# Patient Record
Sex: Male | Born: 1975
Health system: Southern US, Community
[De-identification: ages and names within clinical notes are randomized; demographics above are authoritative.]

## PROBLEM LIST (undated history)

## (undated) ENCOUNTER — Emergency Department: Admission: EM | Discharge status: Absent without leave | Payer: Self-pay

## (undated) DIAGNOSIS — E119 Type 2 diabetes mellitus without complications: Secondary | ICD-10-CM

## (undated) DIAGNOSIS — K3184 Gastroparesis: Secondary | ICD-10-CM

## (undated) DIAGNOSIS — U071 COVID-19: Secondary | ICD-10-CM

## (undated) DIAGNOSIS — A159 Respiratory tuberculosis unspecified: Secondary | ICD-10-CM

## (undated) HISTORY — PX: NO PAST SURGERIES: SHX2092

---

## 2015-01-15 ENCOUNTER — Emergency Department (INDEPENDENT_AMBULATORY_CARE_PROVIDER_SITE_OTHER): Payer: Worker's Compensation

## 2015-01-15 ENCOUNTER — Encounter (HOSPITAL_COMMUNITY): Payer: Self-pay | Admitting: Emergency Medicine

## 2015-01-15 ENCOUNTER — Emergency Department (INDEPENDENT_AMBULATORY_CARE_PROVIDER_SITE_OTHER)
Admission: EM | Admit: 2015-01-15 | Discharge: 2015-01-15 | Disposition: A | Discharge status: Home / Self Care | Payer: Worker's Compensation | Source: Home / Self Care | Attending: Emergency Medicine | Admitting: Emergency Medicine

## 2015-01-15 DIAGNOSIS — S99922A Unspecified injury of left foot, initial encounter: Secondary | ICD-10-CM

## 2015-01-15 HISTORY — DX: Type 2 diabetes mellitus without complications: E11.9

## 2015-01-15 IMAGING — DX DG FOOT COMPLETE 3+V*L*
3 series · 3 of 3 positions shown · non-contrast
Comparison: None.

CLINICAL DATA: Pt had a piece of metal fall on his foot today at
work, pain, swelling of first 3 toes, great toe, 2nd, 3rd, bruising

EXAM:
LEFT FOOT - COMPLETE 3+ VIEW

[foot ap]
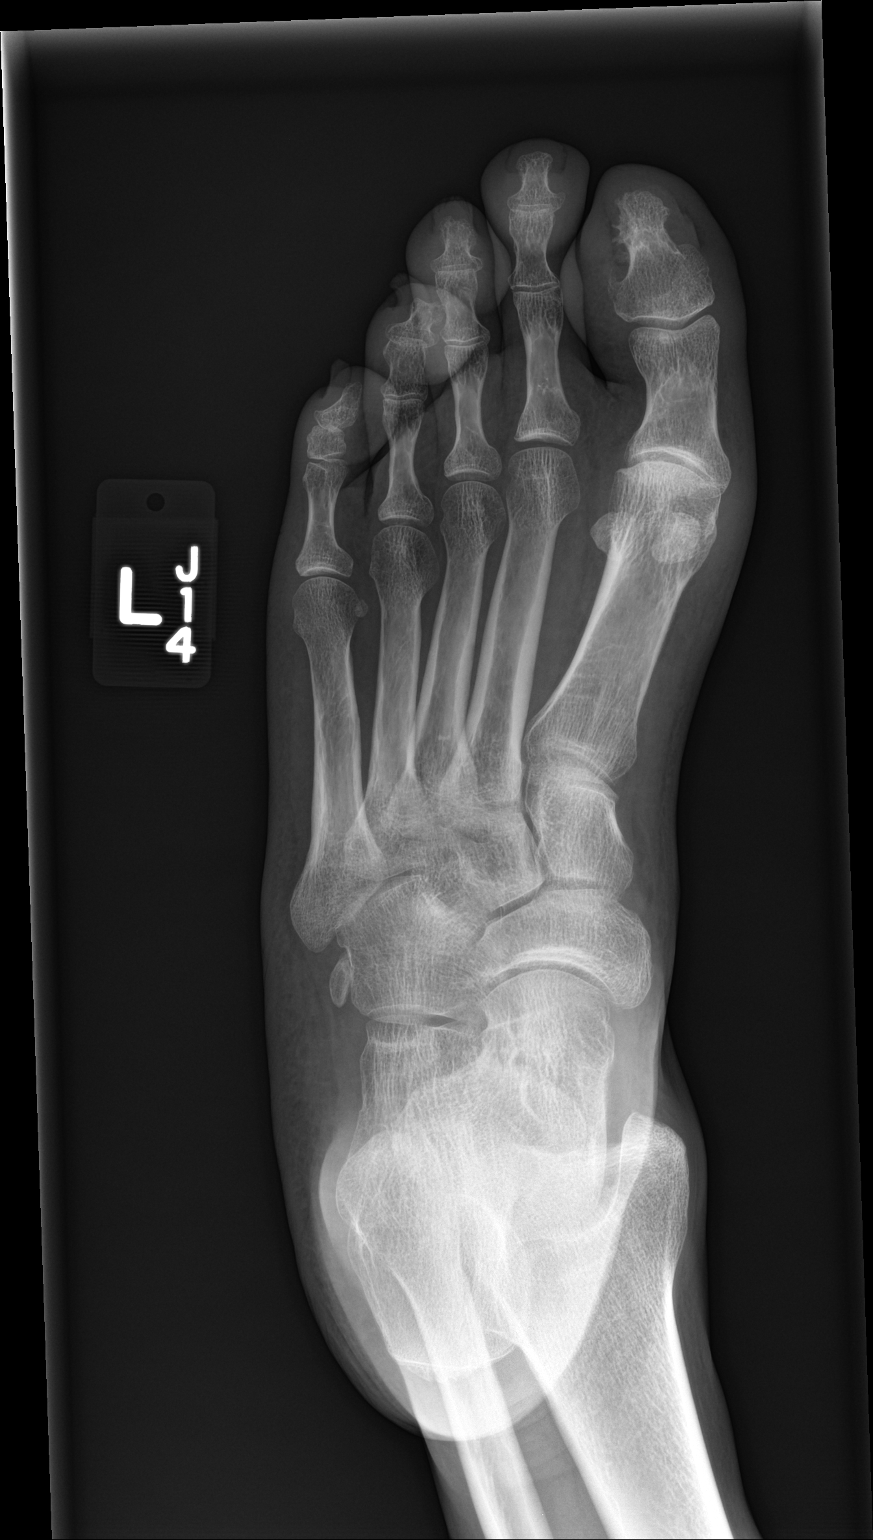

[foot obl]
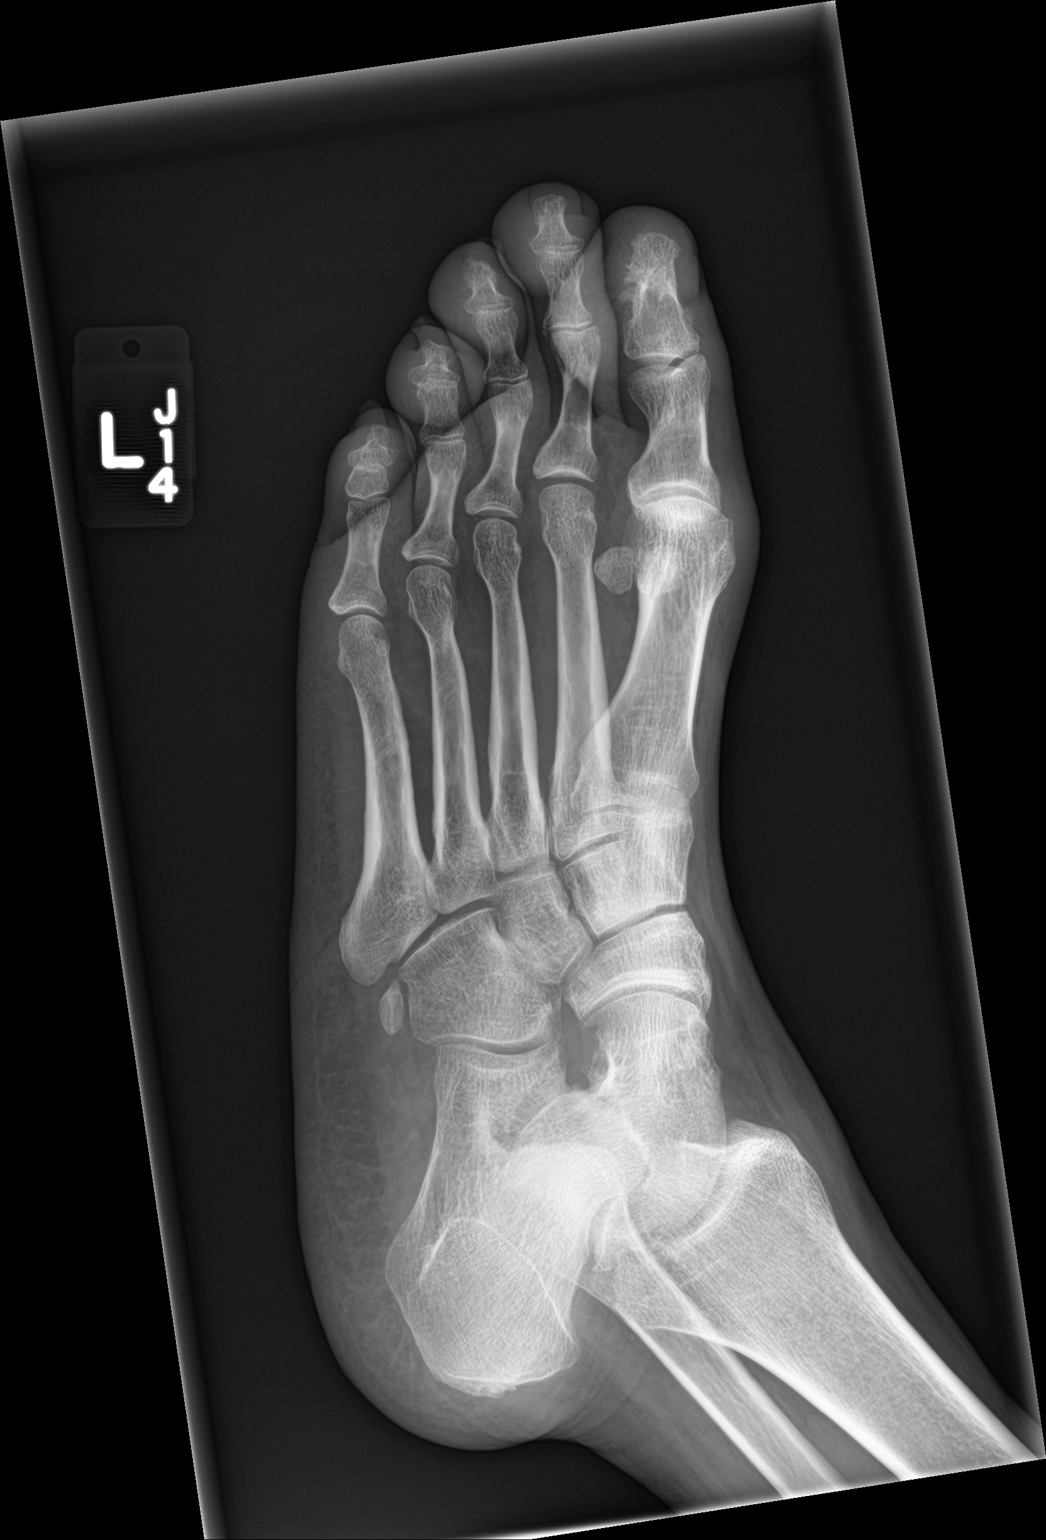

[foot lat]
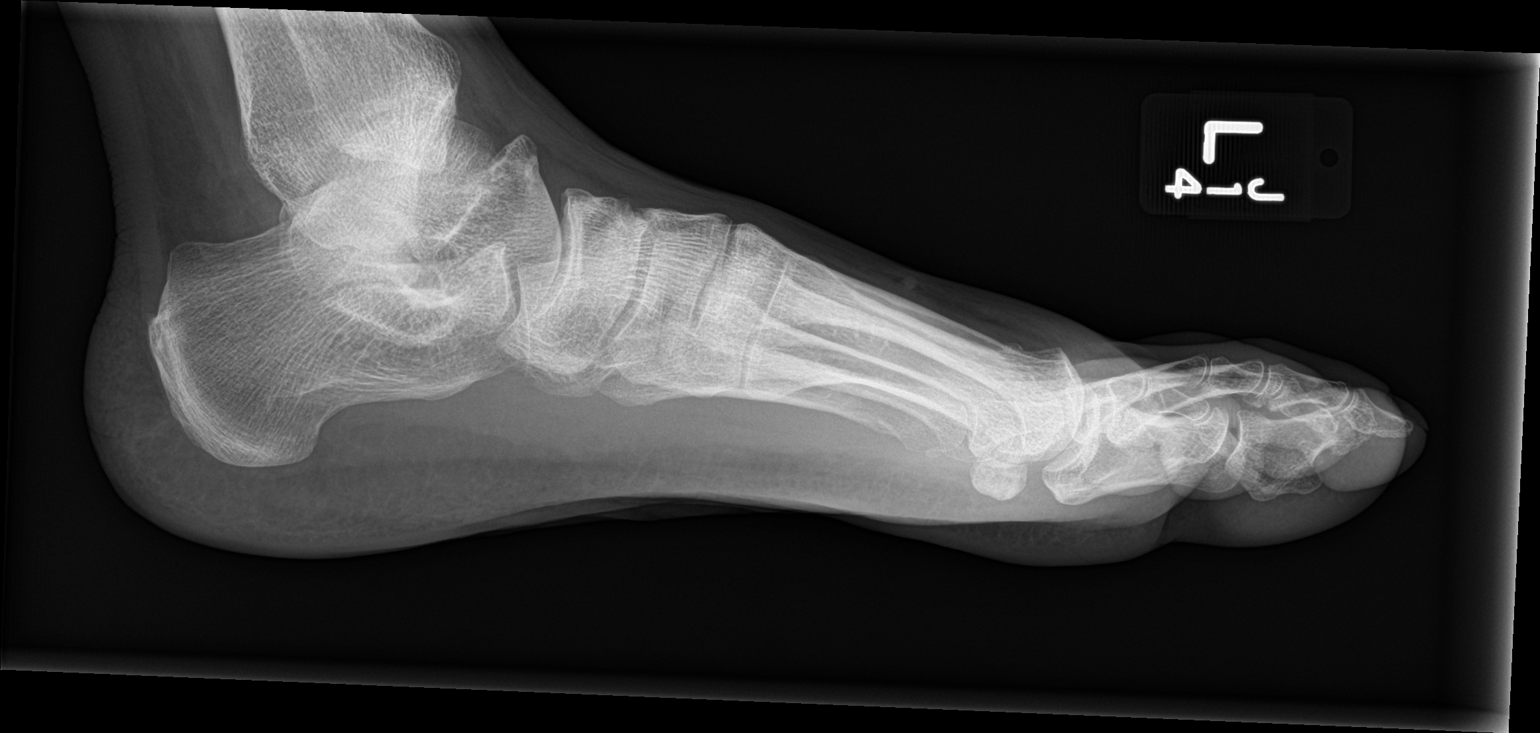

[3 of 3 positions shown; findings below may reference images not displayed]

FINDINGS: No fracture or dislocation of mid foot or forefoot. The phalanges
are normal. The calcaneus is normal. No soft tissue abnormality.
IMPRESSION: No fracture or dislocation.

## 2015-01-15 MED ORDER — IBUPROFEN 600 MG PO TABS: 600.0000 mg | ORAL_TABLET | Freq: Four times a day (QID) | ORAL | Status: DC | PRN

## 2015-01-15 MED ORDER — HYDROCODONE-ACETAMINOPHEN 5-325 MG PO TABS
1.0000 | ORAL_TABLET | Freq: Once | ORAL | Status: AC
  Administered 2015-01-15: 1 via ORAL

## 2015-01-15 MED ORDER — HYDROCODONE-ACETAMINOPHEN 5-325 MG PO TABS: 1.0000 | ORAL_TABLET | ORAL | Status: DC | PRN

## 2015-01-15 MED ORDER — HYDROCODONE-ACETAMINOPHEN 5-325 MG PO TABS
ORAL_TABLET | ORAL | Status: AC
Start: 2015-01-15 — End: 2015-01-15
  Filled 2015-01-15: qty 1

## 2015-01-15 NOTE — Discharge Instructions (Signed)
Wear the postop shoe until you see the doctor tomorrow. Apply ice to the area. With ice pack on for 15 minutes, and 15 minutes off, then 15 minutes on, continue with this pattern. Take ibuprofen every 6 hours as needed for pain. Use the hydrocodone every 4-6 hours as needed for severe pain.  Follow-up with occupational medicine doctor tomorrow as scheduled.

## 2015-01-15 NOTE — ED Notes (Signed)
Pt states that a 100lb metal plate fell on his left foot today at work.

## 2015-01-15 NOTE — ED Provider Notes (Signed)
CSN: [id number]     Arrival date & time 01/15/15  1716 History   First MD Initiated Contact with Patient 01/15/15 1800     Chief Complaint  Patient presents with  . Foot Injury   (Consider location/radiation/quality/duration/timing/severity/associated sxs/prior Treatment) HPI  He is a 39 year old man here for evaluation of left foot injury. He was at work today when he dropped a heavy metal object on his left foot. He has pain and swelling across his first second and third metatarsals and toes. He is able to move all of his toes.  Past Medical History  Diagnosis Date  . Diabetes mellitus without complication    History reviewed. No pertinent past surgical history. History reviewed. No pertinent family history. History  Substance Use Topics  . Smoking status: Never Smoker   . Smokeless tobacco: Not on file  . Alcohol Use: No    Review of Systems As in history of present illness Allergies  Review of patient's allergies indicates no known allergies.  Home Medications   Prior to Admission medications   Medication Sig Start Date End Date Taking? Authorizing Provider  HYDROcodone-acetaminophen (NORCO/VICODIN) 5-325 MG per tablet Take 1 tablet by mouth every 4 (four) hours as needed for moderate pain. 01/15/15   Melony Overly, MD  ibuprofen (ADVIL,MOTRIN) 600 MG tablet Take 1 tablet (600 mg total) by mouth every 6 (six) hours as needed for mild pain or moderate pain. 01/15/15   Melony Overly, MD   BP 117/74 mmHg  Pulse 82  Temp(Src) 99.1 F (37.3 C) (Oral)  Resp 18  SpO2 99% Physical Exam  Constitutional: He is oriented to person, place, and time. He appears well-developed and well-nourished. No distress.  Cardiovascular: Normal rate.   Pulmonary/Chest: Effort normal.  Musculoskeletal:  Left foot: Bruising and swelling over the first MTP joint and great toe, and the second and third toes. He has brisk cap refill in all digits. He is quite tender, particularly at the first MTP joint.   Neurological: He is alert and oriented to person, place, and time.    ED Course  Procedures (including critical care time) Labs Review Labs Reviewed - No data to display  Imaging Review Dg Foot Complete Left  01/15/2015   CLINICAL DATA:  Pt had a piece of metal fall on his foot today at work, pain, swelling of first 3 toes, great toe, 2nd, 3rd, bruising  EXAM: LEFT FOOT - COMPLETE 3+ VIEW  COMPARISON:  None.  FINDINGS: No fracture or dislocation of mid foot or forefoot. The phalanges are normal. The calcaneus is normal. No soft tissue abnormality.  IMPRESSION: No fracture or dislocation.   Electronically Signed   By: Suzy Bouchard M.D.   On: 01/15/2015 18:23     MDM   1. Injury of left foot, initial encounter    Norco 5-325 mg one tablet given for pain.  Postop shoe for comfort. Discussed icing. Ibuprofen and Norco for pain. Follow-up at occupational medicine as scheduled tomorrow morning at [facility] R[facility]i [place], MD 01/15/15 [phone]

## 2015-02-09 IMAGING — CR DG WRIST COMPLETE 3+V*R*
4 series · 4 of 4 positions shown · non-contrast
Comparison: None.

CLINICAL DATA: Fall.  Diffuse right wrist pain.

EXAM:
RIGHT WRIST - COMPLETE 3+ VIEW

[wrist pa]
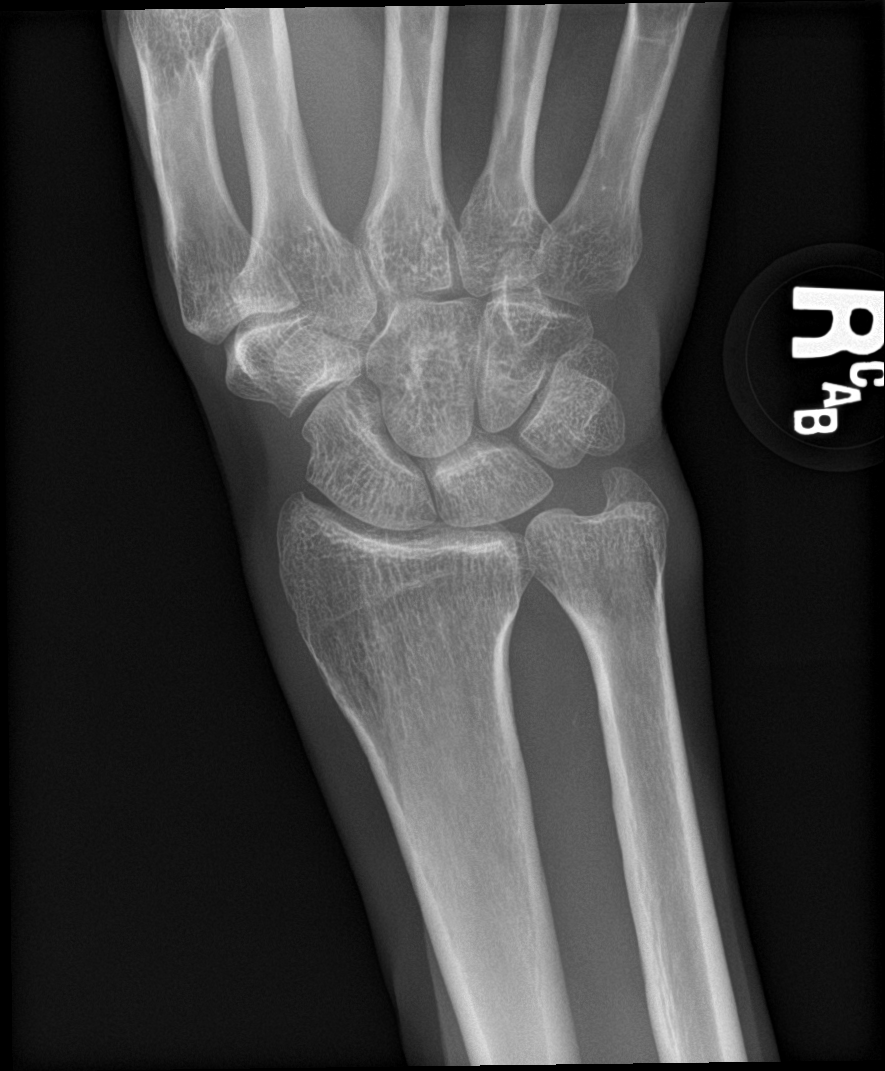

[wrist obl]
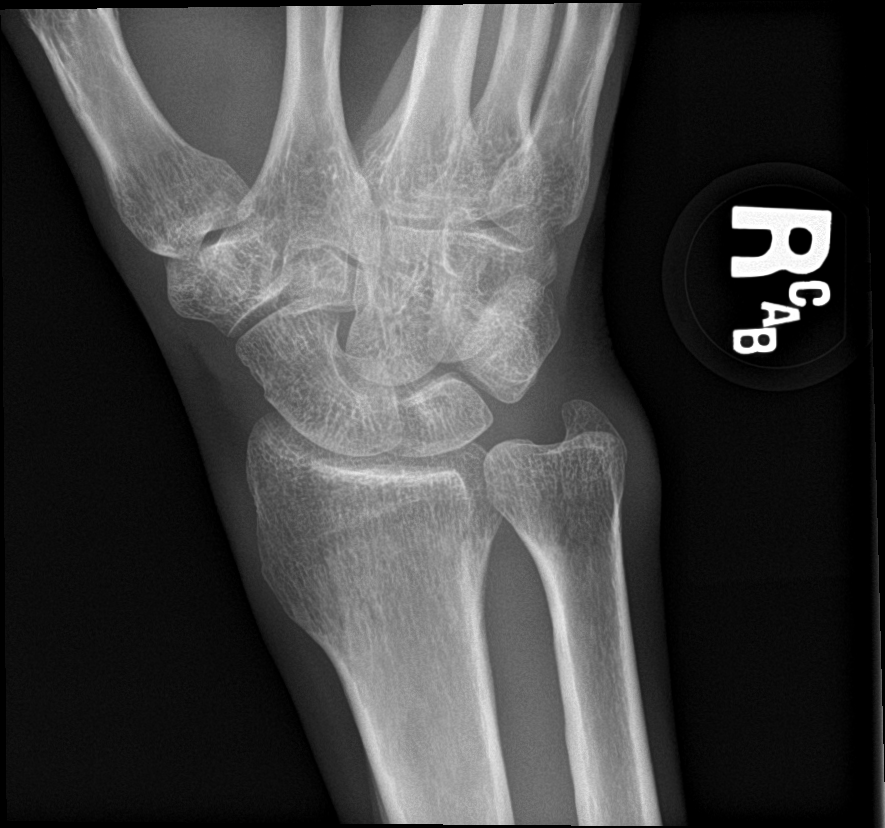

[wrist lat]
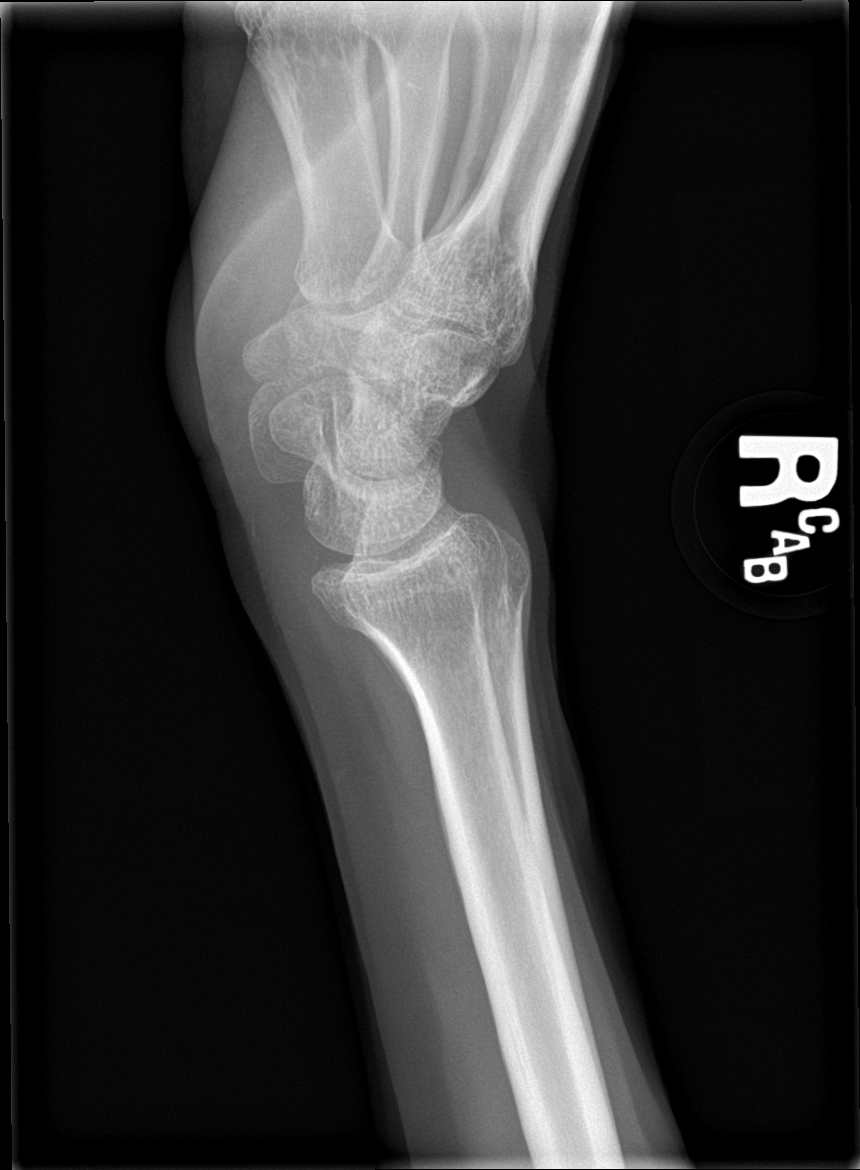

[navicular]
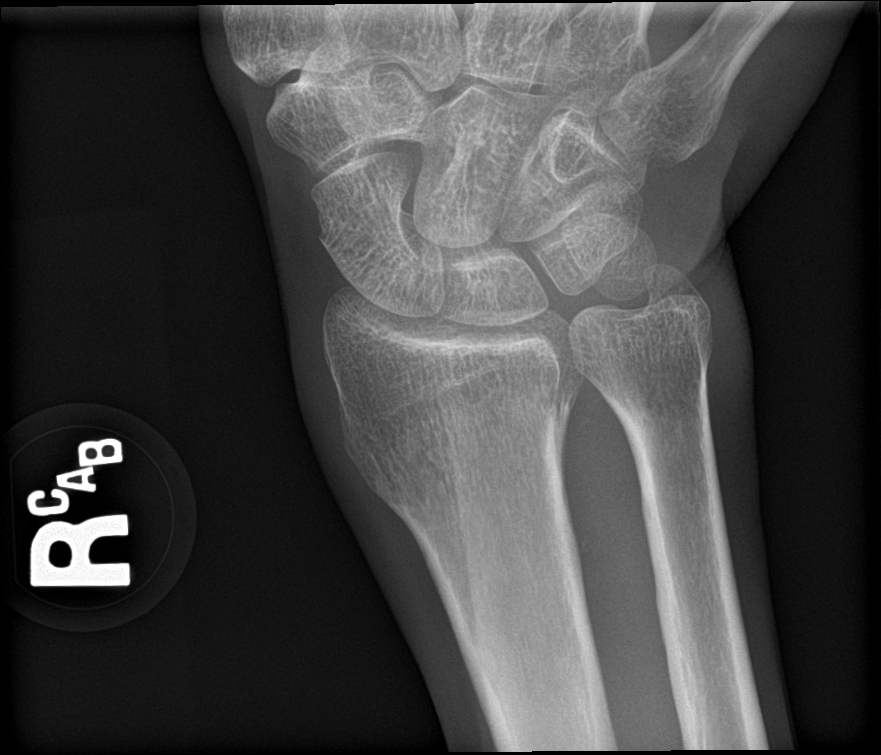

[4 of 4 positions shown; findings below may reference images not displayed]

FINDINGS: There is no evidence of fracture or dislocation. There is no
evidence of arthropathy or other focal bone abnormality. Soft
tissues are unremarkable.
IMPRESSION: Negative.

## 2015-08-04 ENCOUNTER — Other Ambulatory Visit: Payer: Self-pay | Admitting: Family Medicine

## 2015-08-04 ENCOUNTER — Ambulatory Visit
Admission: RE | Admit: 2015-08-04 | Discharge: 2015-08-04 | Disposition: A | Discharge status: Home / Self Care | Payer: No Typology Code available for payment source | Source: Ambulatory Visit | Attending: Family Medicine | Admitting: Family Medicine

## 2015-08-04 DIAGNOSIS — R52 Pain, unspecified: Secondary | ICD-10-CM

## 2015-08-04 IMAGING — CR DG RIBS W/ CHEST 3+V*L*
4 series · 4 of 4 positions shown · non-contrast
Comparison: None.

CLINICAL DATA: Left anterior chest pain following an injury.

EXAM:
LEFT RIBS AND CHEST - 3+ VIEW

[w chest pa]
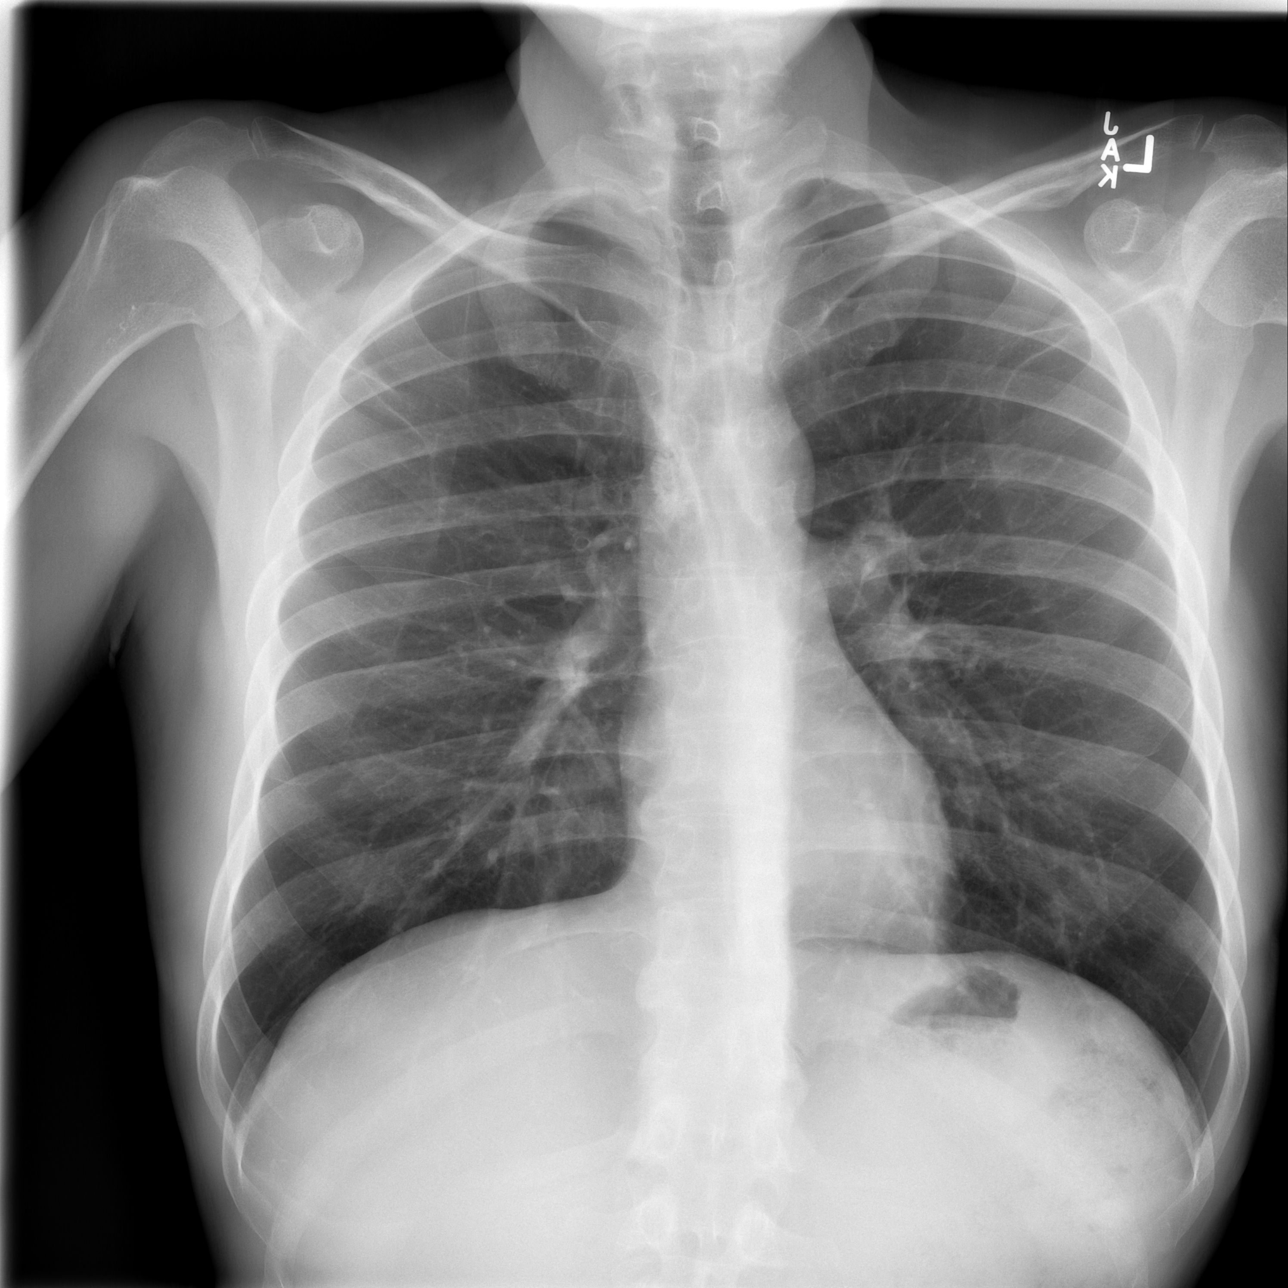

[w ribs ap/pa upper left *]
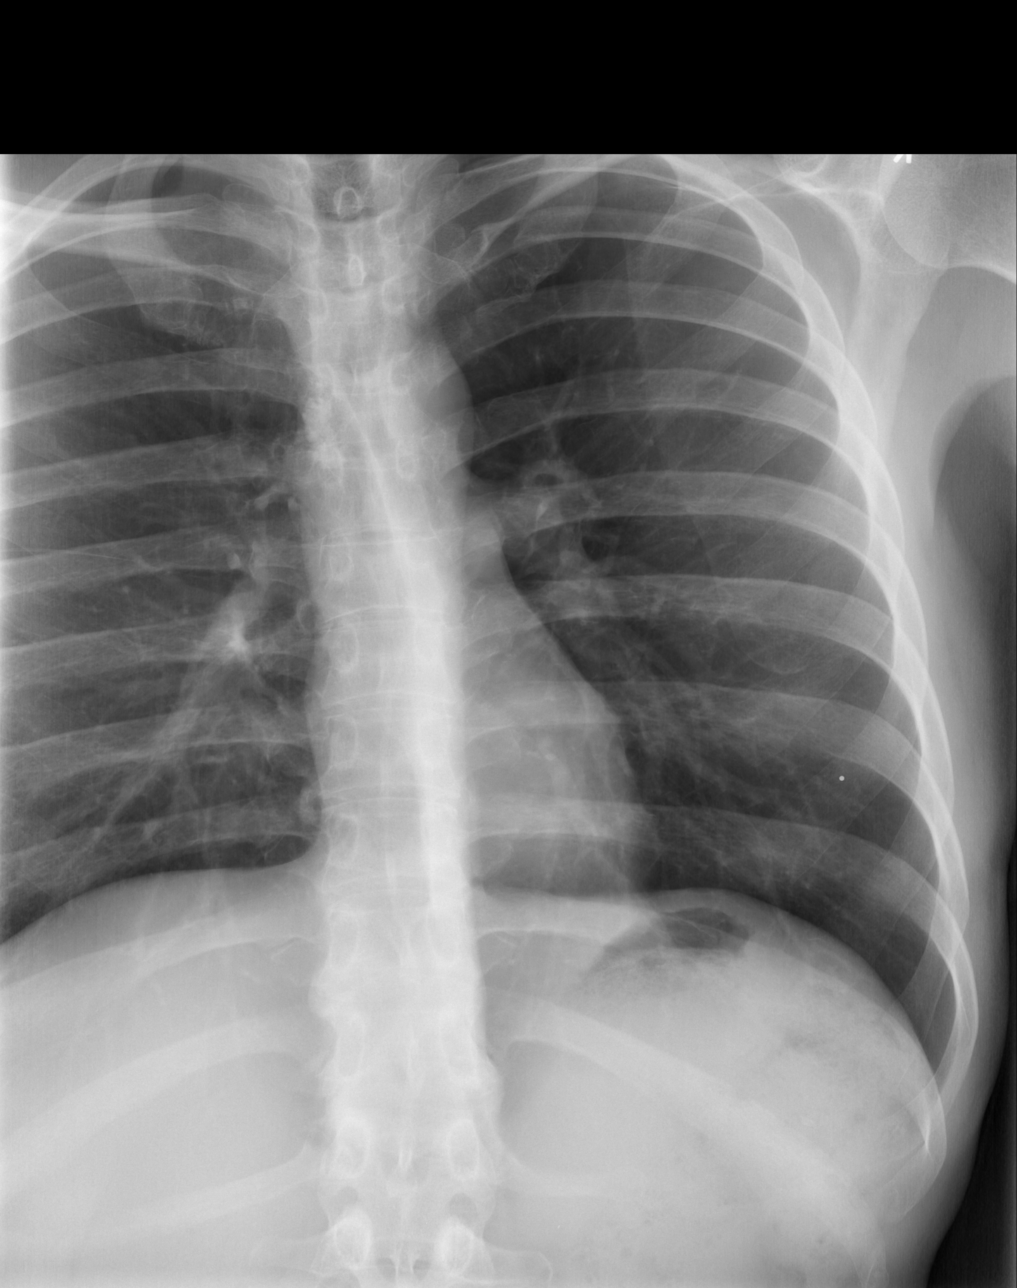

[w ribs ap/pa lower left *]
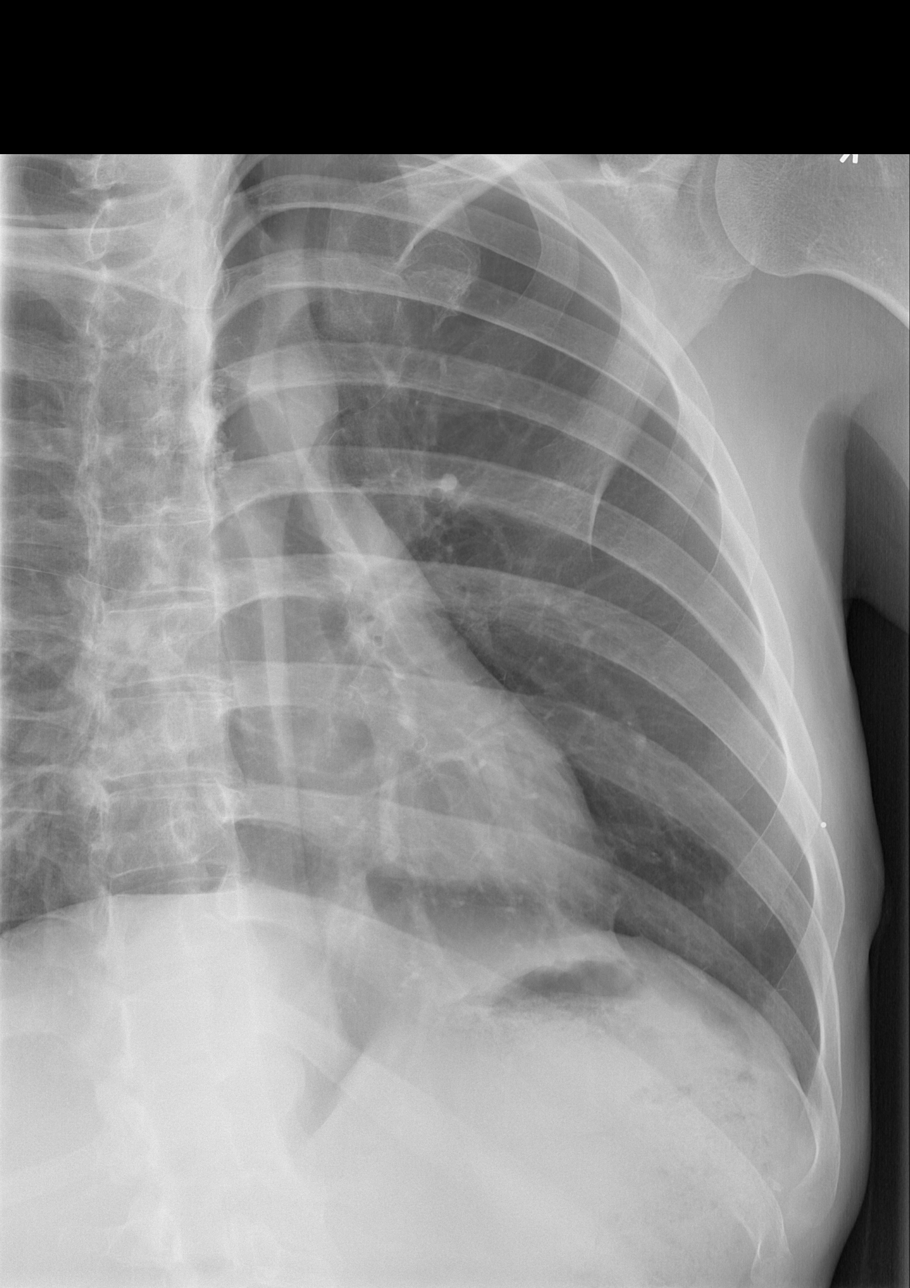

[w ribs oblique left *]
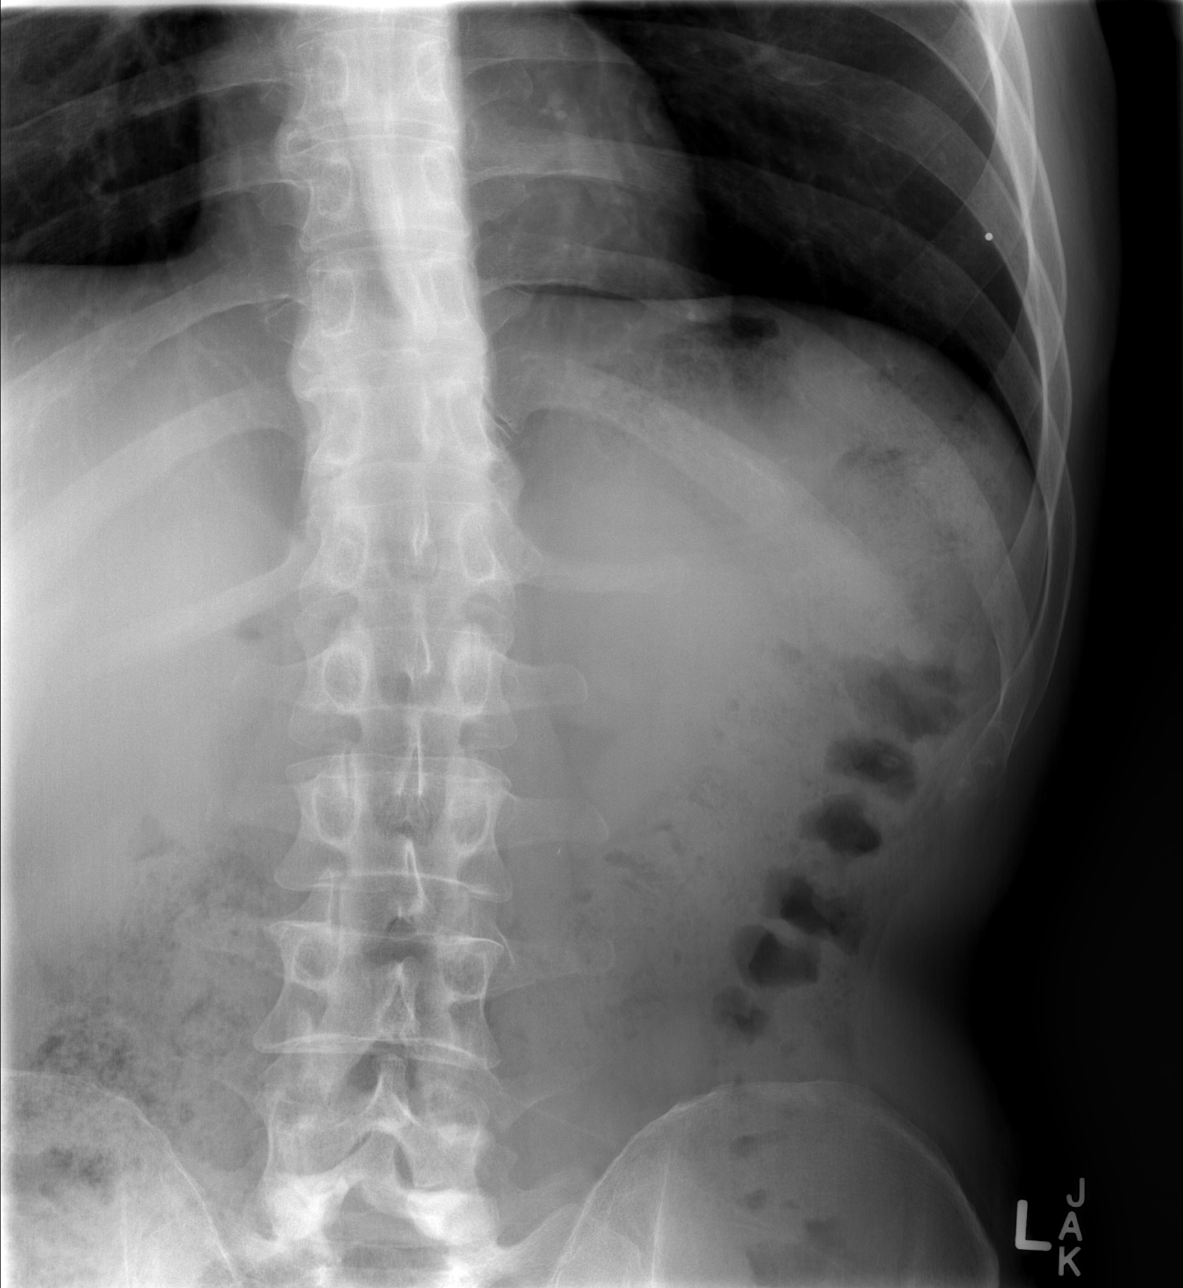

[4 of 4 positions shown; findings below may reference images not displayed]

FINDINGS: Normal sized heart. Clear lungs. Minimal central peribronchial
thickening. No rib fracture or pneumothorax. Calcified right
superior mediastinal lymph nodes.
IMPRESSION: No fracture or acute abnormality. Minimal chronic bronchitic
changes.

## 2016-04-06 ENCOUNTER — Emergency Department: Payer: Self-pay

## 2016-04-06 ENCOUNTER — Encounter: Payer: Self-pay | Admitting: Medical Oncology

## 2016-04-06 ENCOUNTER — Inpatient Hospital Stay
Admission: EM | Admit: 2016-04-06 | Discharge: 2016-04-10 | DRG: 193 | Disposition: A | Discharge status: Home / Self Care | Payer: Self-pay | Attending: Internal Medicine | Admitting: Internal Medicine

## 2016-04-06 DIAGNOSIS — E871 Hypo-osmolality and hyponatremia: Secondary | ICD-10-CM | POA: Diagnosis present

## 2016-04-06 DIAGNOSIS — J984 Other disorders of lung: Secondary | ICD-10-CM

## 2016-04-06 DIAGNOSIS — W010XXA Fall on same level from slipping, tripping and stumbling without subsequent striking against object, initial encounter: Secondary | ICD-10-CM | POA: Diagnosis present

## 2016-04-06 DIAGNOSIS — Z681 Body mass index (BMI) 19 or less, adult: Secondary | ICD-10-CM

## 2016-04-06 DIAGNOSIS — E46 Unspecified protein-calorie malnutrition: Secondary | ICD-10-CM | POA: Insufficient documentation

## 2016-04-06 DIAGNOSIS — Y990 Civilian activity done for income or pay: Secondary | ICD-10-CM

## 2016-04-06 DIAGNOSIS — R739 Hyperglycemia, unspecified: Secondary | ICD-10-CM

## 2016-04-06 DIAGNOSIS — R918 Other nonspecific abnormal finding of lung field: Secondary | ICD-10-CM

## 2016-04-06 DIAGNOSIS — Z833 Family history of diabetes mellitus: Secondary | ICD-10-CM

## 2016-04-06 DIAGNOSIS — E876 Hypokalemia: Secondary | ICD-10-CM | POA: Diagnosis present

## 2016-04-06 DIAGNOSIS — R9389 Abnormal findings on diagnostic imaging of other specified body structures: Secondary | ICD-10-CM

## 2016-04-06 DIAGNOSIS — M25531 Pain in right wrist: Secondary | ICD-10-CM | POA: Diagnosis present

## 2016-04-06 DIAGNOSIS — J189 Pneumonia, unspecified organism: Principal | ICD-10-CM | POA: Diagnosis present

## 2016-04-06 DIAGNOSIS — E43 Unspecified severe protein-calorie malnutrition: Secondary | ICD-10-CM | POA: Diagnosis present

## 2016-04-06 DIAGNOSIS — E1165 Type 2 diabetes mellitus with hyperglycemia: Secondary | ICD-10-CM | POA: Diagnosis present

## 2016-04-06 LAB — RAPID HIV SCREEN (HIV 1/2 AB+AG)
HIV 1/2 Antibodies: NONREACTIVE
HIV-1 P24 Antigen - HIV24: NONREACTIVE

## 2016-04-06 LAB — URINALYSIS COMPLETE WITH MICROSCOPIC (ARMC ONLY)
BACTERIA UA: NONE SEEN
BILIRUBIN URINE: NEGATIVE
HGB URINE DIPSTICK: NEGATIVE
LEUKOCYTES UA: NEGATIVE
Nitrite: NEGATIVE
Protein, ur: NEGATIVE mg/dL
Specific Gravity, Urine: 1.028 (ref 1.005–1.030)
Squamous Epithelial / LPF: NONE SEEN
WBC UA: NONE SEEN WBC/hpf (ref 0–5)
pH: 5 (ref 5.0–8.0)

## 2016-04-06 LAB — GLUCOSE, CAPILLARY
GLUCOSE-CAPILLARY: 344 mg/dL — AB (ref 65–99)
GLUCOSE-CAPILLARY: 381 mg/dL — AB (ref 65–99)
Glucose-Capillary: 407 mg/dL — ABNORMAL HIGH (ref 65–99)

## 2016-04-06 LAB — BASIC METABOLIC PANEL
ANION GAP: 16 — AB (ref 5–15)
BUN: 20 mg/dL (ref 6–20)
CO2: 21 mmol/L — ABNORMAL LOW (ref 22–32)
Calcium: 9.1 mg/dL (ref 8.9–10.3)
Chloride: 91 mmol/L — ABNORMAL LOW (ref 101–111)
Creatinine, Ser: 0.74 mg/dL (ref 0.61–1.24)
GFR calc Af Amer: >60 mL/min (ref >60)
Glucose, Bld: 632 mg/dL (ref 65–99)
Potassium: 3.9 mmol/L (ref 3.5–5.1)
SODIUM: 128 mmol/L — AB (ref 135–145)

## 2016-04-06 IMAGING — CR DG CHEST 2V
2 series · 2 of 2 positions shown · non-contrast
Comparison: None.

CLINICAL DATA: Right site chest wall pain.

EXAM:
CHEST  2 VIEW

[chest pa]
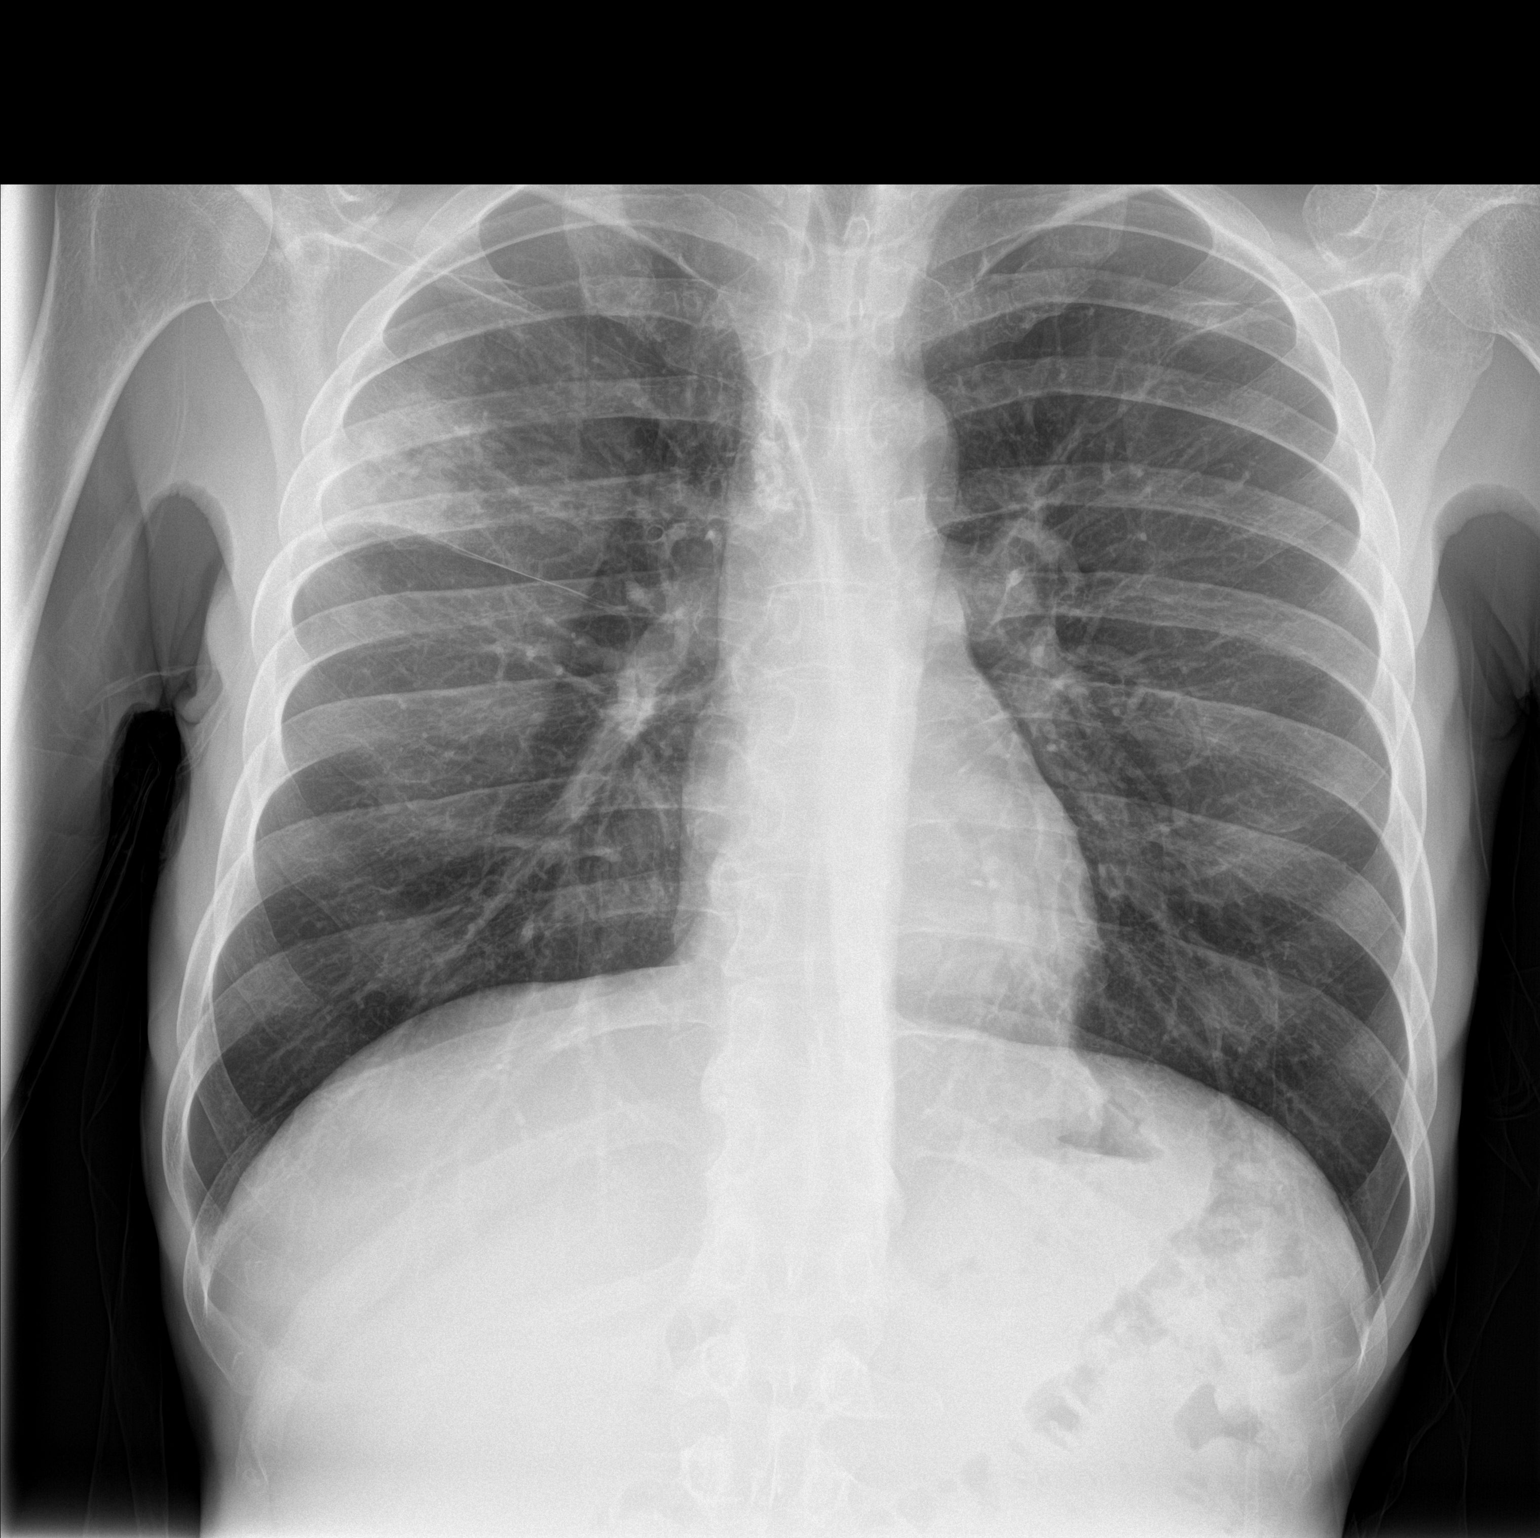

[chest lat]
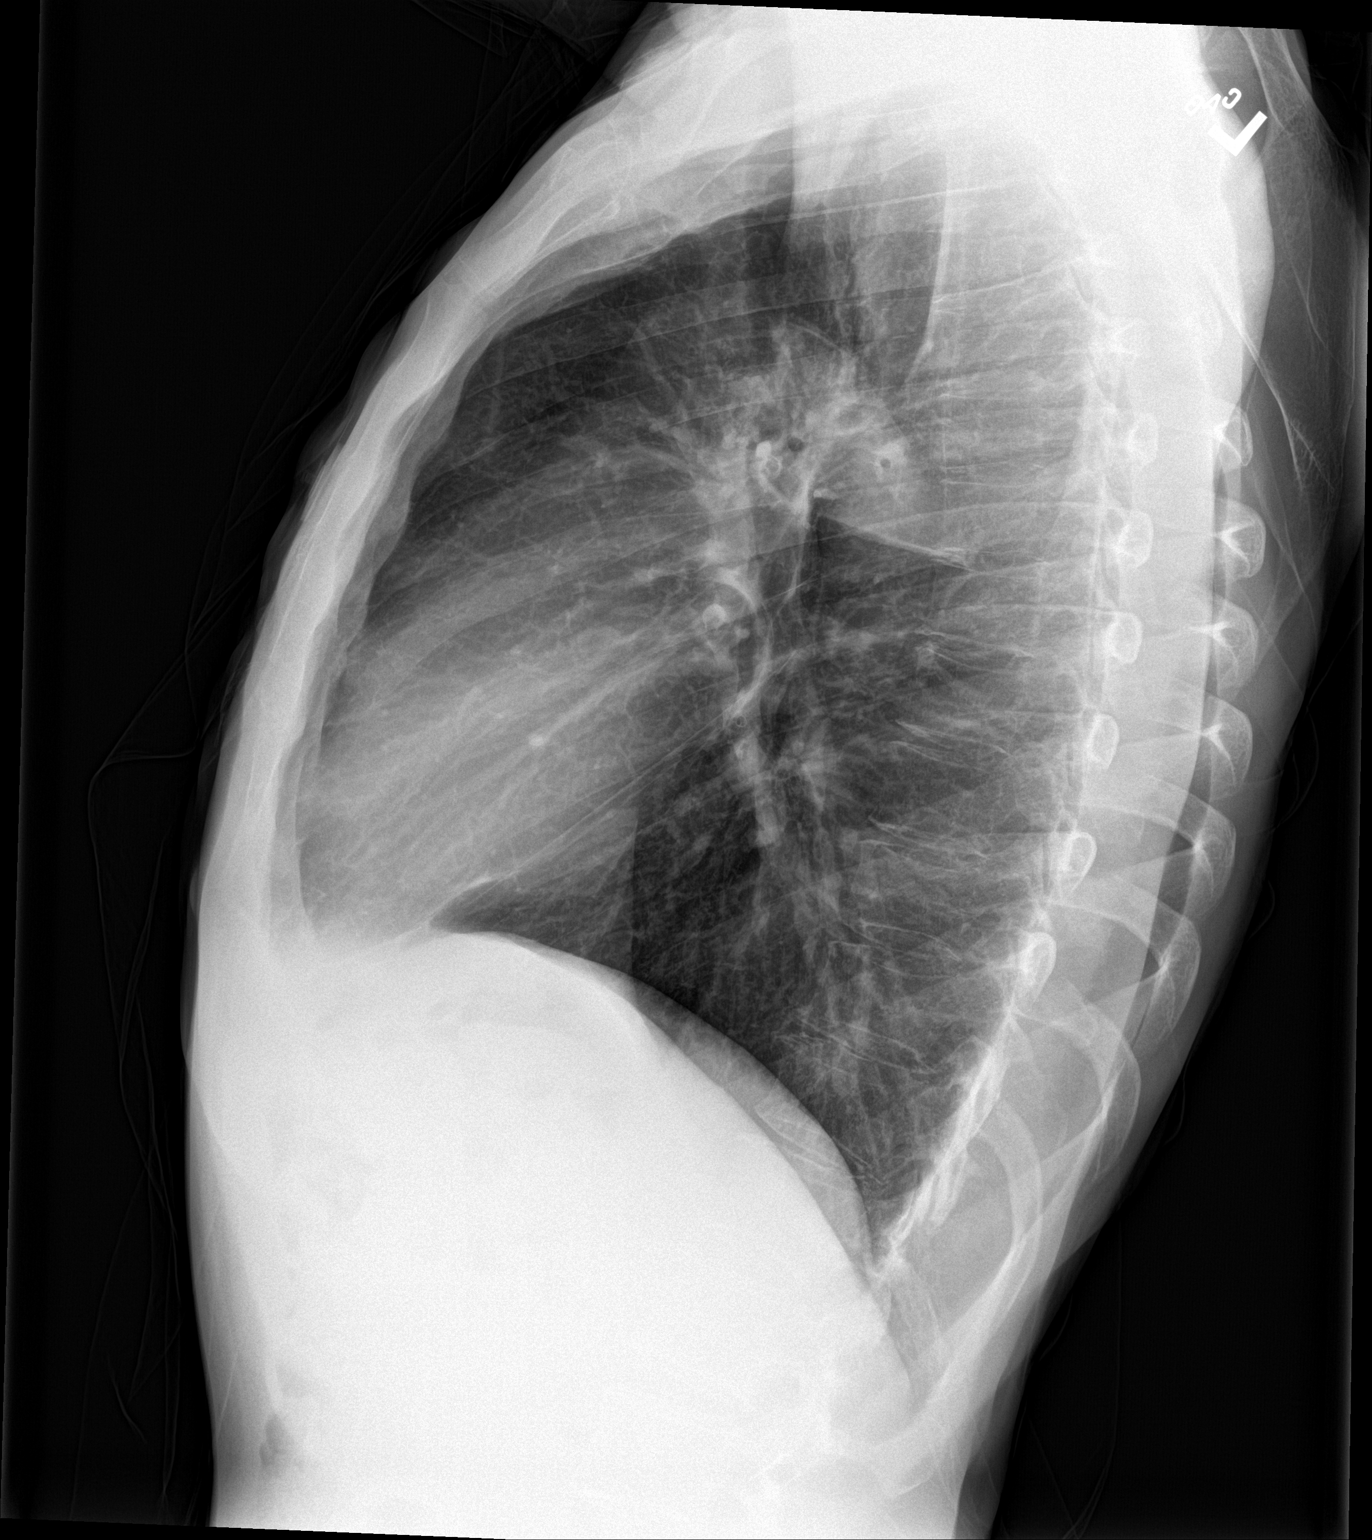

[2 of 2 positions shown; findings below may reference images not displayed]

FINDINGS: Normal heart size. No pleural effusion or edema. Airspace
consolidation is identified within the right upper lobe compatible
with pneumonia. Left lung is clear.
IMPRESSION: 1. Right upper lobe pneumonia. Followup PA and lateral chest X-ray
is recommended in 3-4 weeks following trial of antibiotic therapy to
ensure resolution and exclude underlying malignancy.

## 2016-04-06 IMAGING — CT CT CHEST W/ CM
2 of 3 series · 17 of 46 positions shown, 19 images · IV contrast (isovue)
Comparison: Chest radiograph on [DATE]

CLINICAL DATA: Right upper lobe opacity on chest radiograph.
Right-sided chest pain.

EXAM:
CT CHEST WITH CONTRAST
TECHNIQUE: Multidetector CT imaging of the chest was performed during
intravenous contrast administration.
CONTRAST:  75 mL Isovue 370

[Series 2: routine chest with · axial · 0.68mm/px · z∈[-755,-480]mm · 14 of 65 slices shown, 16 images]
[im 5/65  soft-tissue]
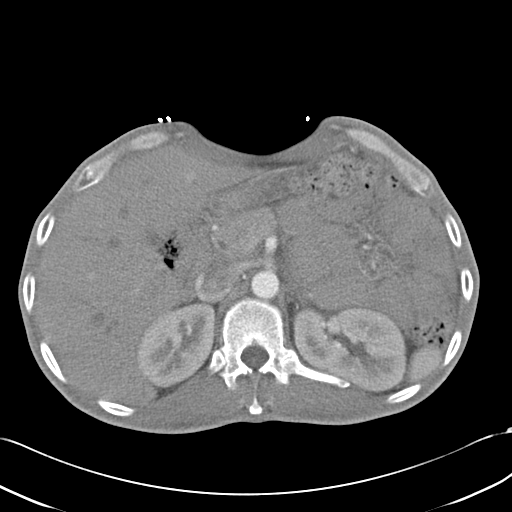
[im 5/65  bone]
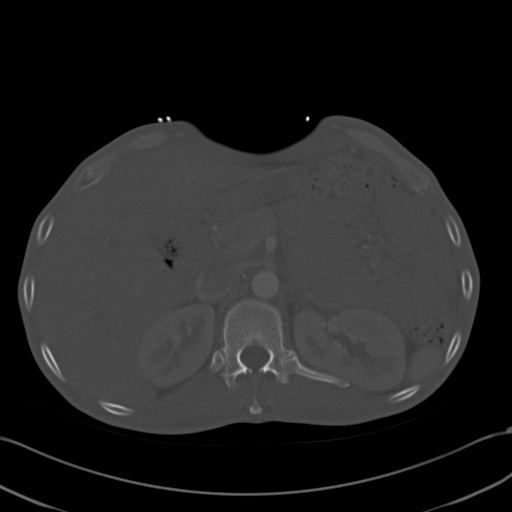
[im 9/65  soft-tissue]
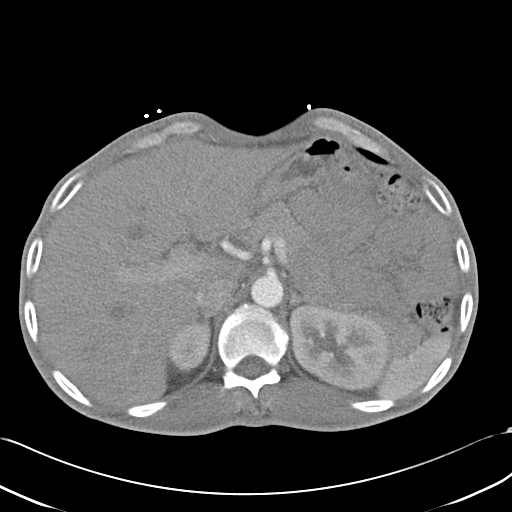
[im 13/65  soft-tissue]
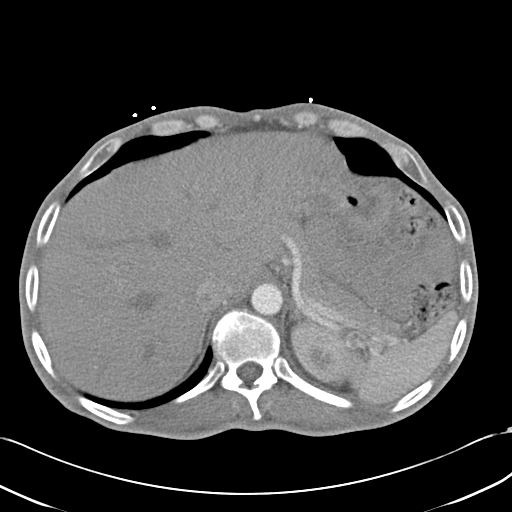
[im 17/65  soft-tissue]
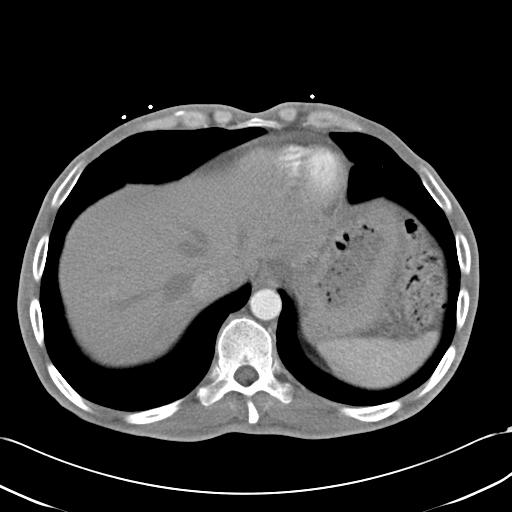
[im 21/65  soft-tissue]
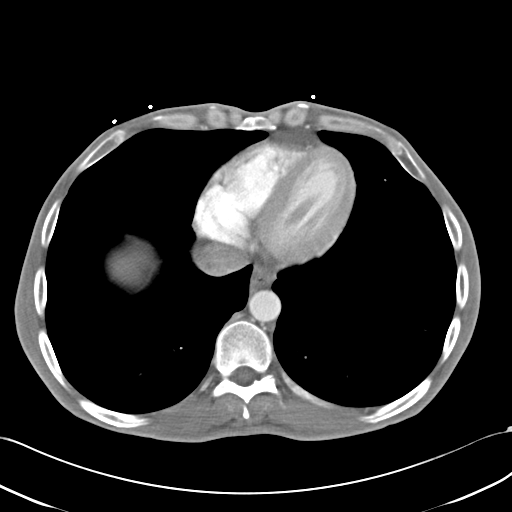
[im 25/65  soft-tissue]
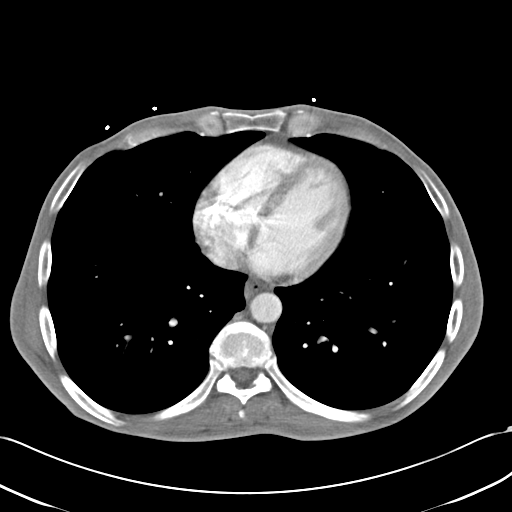
[im 29/65  soft-tissue]
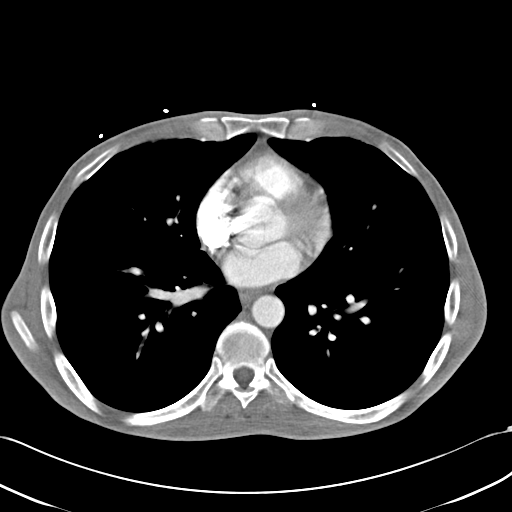
[im 36/65  soft-tissue]
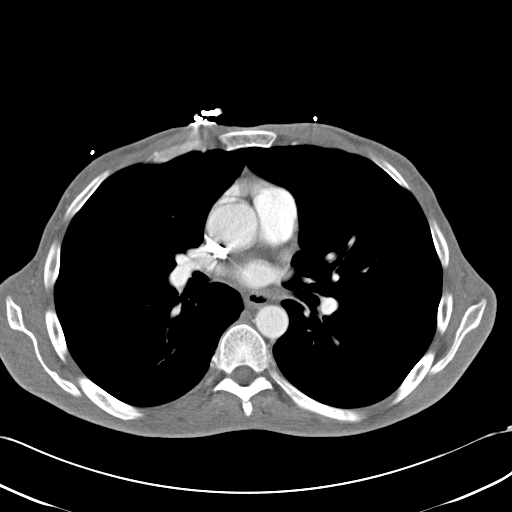
[im 40/65  soft-tissue]
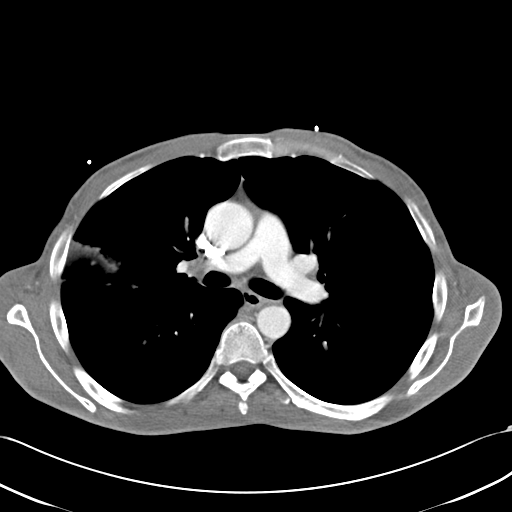
[im 40/65  bone]
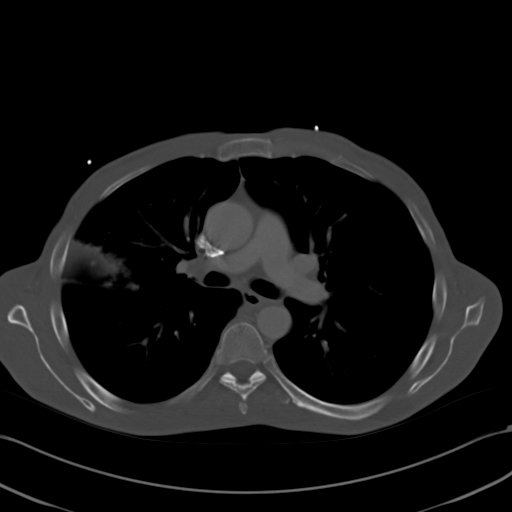
[im 44/65  soft-tissue]
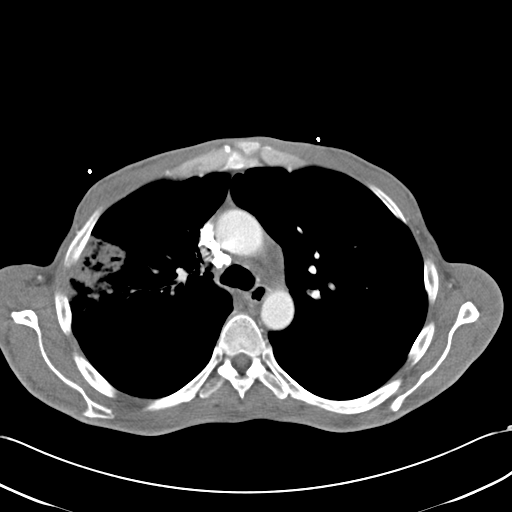
[im 48/65  soft-tissue]
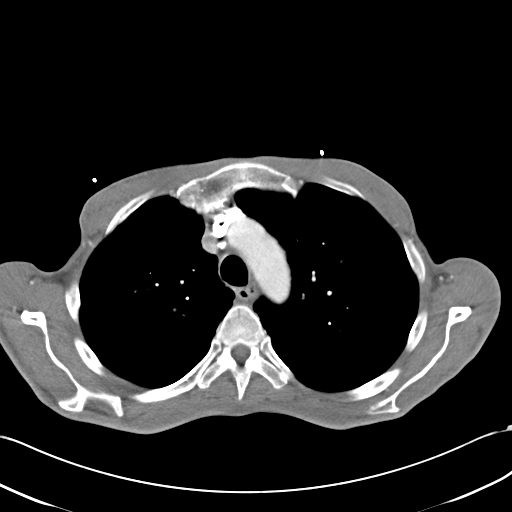
[im 52/65  soft-tissue]
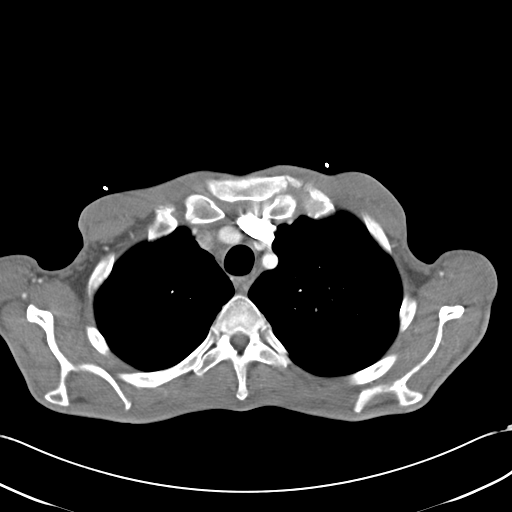
[im 56/65  soft-tissue]
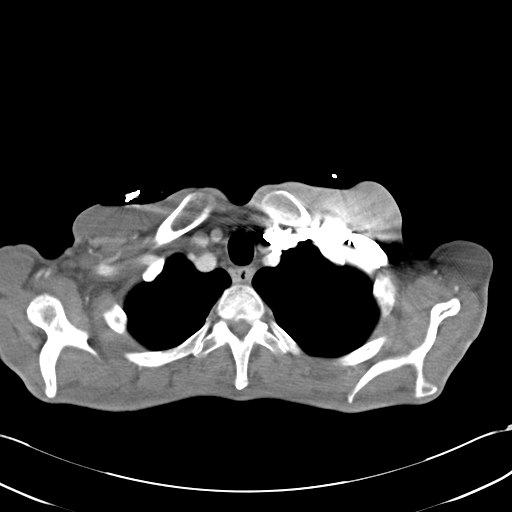
[im 60/65  soft-tissue]
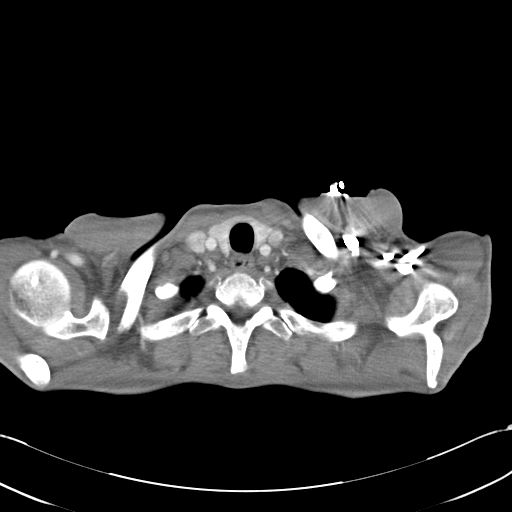

[Series 5: routine chest with cor · coronal · 0.67mm/px · 3 of 111 slices shown]
[im 37/111  soft-tissue]
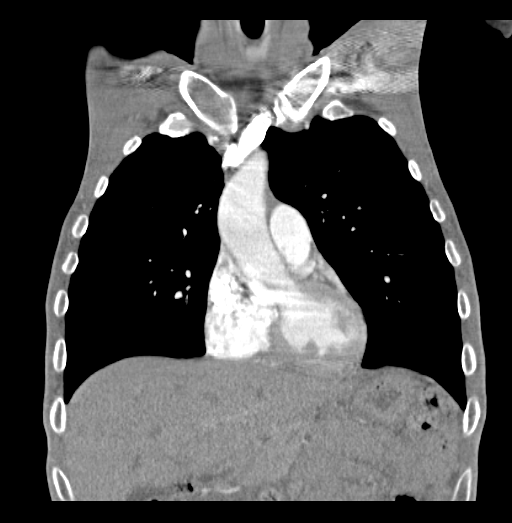
[im 49/111  soft-tissue]
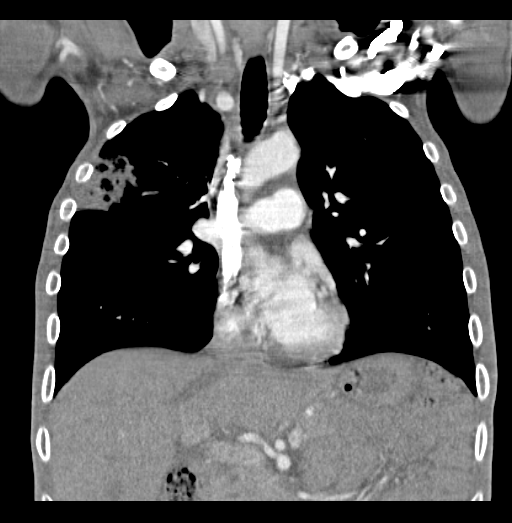
[im 62/111  soft-tissue]
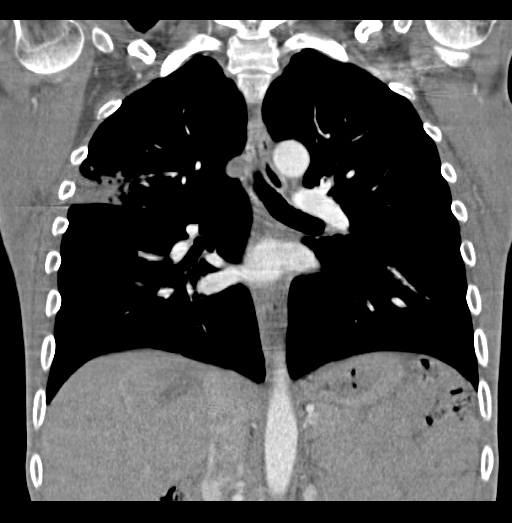

[17 of 46 positions shown; findings below may reference images not displayed]

FINDINGS: Mediastinum/Lymph Nodes: No masses, pathologically enlarged lymph
nodes, or other significant abnormality.

Lungs/Pleura: Ill-defined area of consolidation is seen in the
peripheral right upper lobe which shows air bronchograms and tiny
cystic lucencies which may represent early cavitation. There are
also adjacent patchy areas of airspace disease and tiny sub-cm
nodular densities in the right upper lobe and superior segment of
the right lower lobe. These findings are suspicious for pneumonia.
No evidence pleural effusion. Central tracheobronchial airways are
patent.

Upper abdomen: No acute findings.

Musculoskeletal: No chest wall mass or suspicious bone lesions
identified.
IMPRESSION: Focal consolidation in peripheral right upper lobe with central air
bronchograms and possible early cavitary changes. These findings are
most consistent with pneumonia. Followup PA and lateral chest X-ray
is recommended in 3-4 weeks following trial of antibiotic therapy to
ensure resolution and exclude underlying malignancy.

No evidence of lymphadenopathy or pleural effusion.

## 2016-04-06 MED ORDER — LEVOFLOXACIN IN D5W 750 MG/150ML IV SOLN
750.0000 mg | INTRAVENOUS | Status: DC
  Administered 2016-04-07: 750 mg via INTRAVENOUS
  Filled 2016-04-06 (×2): qty 150

## 2016-04-06 MED ORDER — INSULIN ASPART PROT & ASPART (70-30 MIX) 100 UNIT/ML ~~LOC~~ SUSP
50.0000 [IU] | Freq: Two times a day (BID) | SUBCUTANEOUS | Status: DC
  Administered 2016-04-07 (×2): 50 [IU] via SUBCUTANEOUS
  Filled 2016-04-06 (×2): qty 50

## 2016-04-06 MED ORDER — LEVOFLOXACIN IN D5W 750 MG/150ML IV SOLN
750.0000 mg | Freq: Once | INTRAVENOUS | Status: AC
  Administered 2016-04-06: 750 mg via INTRAVENOUS
  Filled 2016-04-06: qty 150

## 2016-04-06 MED ORDER — AZITHROMYCIN 250 MG PO TABS: 250.0000 mg | ORAL_TABLET | Freq: Every day | ORAL | Status: DC

## 2016-04-06 MED ORDER — ACETAMINOPHEN 650 MG RE SUPP: 650.0000 mg | Freq: Four times a day (QID) | RECTAL | Status: DC | PRN

## 2016-04-06 MED ORDER — PNEUMOCOCCAL VAC POLYVALENT 25 MCG/0.5ML IJ INJ: 0.5000 mL | INJECTION | INTRAMUSCULAR | Status: DC

## 2016-04-06 MED ORDER — AZITHROMYCIN 500 MG PO TABS: 500.0000 mg | ORAL_TABLET | Freq: Every day | ORAL | Status: DC

## 2016-04-06 MED ORDER — SODIUM CHLORIDE 0.9 % IV SOLN
INTRAVENOUS | Status: DC
  Administered 2016-04-06 – 2016-04-09 (×4): via INTRAVENOUS

## 2016-04-06 MED ORDER — SODIUM CHLORIDE 0.9 % IV BOLUS (SEPSIS)
1000.0000 mL | Freq: Once | INTRAVENOUS | Status: AC
  Administered 2016-04-06: 1000 mL via INTRAVENOUS

## 2016-04-06 MED ORDER — IOPAMIDOL (ISOVUE-370) INJECTION 76%
75.0000 mL | Freq: Once | INTRAVENOUS | Status: AC | PRN
  Administered 2016-04-06: 75 mL via INTRAVENOUS

## 2016-04-06 MED ORDER — OXYCODONE HCL 5 MG PO TABS
5.0000 mg | ORAL_TABLET | ORAL | Status: DC | PRN
  Administered 2016-04-06: 5 mg via ORAL
  Filled 2016-04-06: qty 1

## 2016-04-06 MED ORDER — SODIUM CHLORIDE 0.9 % IV BOLUS (SEPSIS)
500.0000 mL | Freq: Once | INTRAVENOUS | Status: AC
  Administered 2016-04-06: 500 mL via INTRAVENOUS

## 2016-04-06 MED ORDER — ACETAMINOPHEN 325 MG PO TABS: 650.0000 mg | ORAL_TABLET | Freq: Four times a day (QID) | ORAL | Status: DC | PRN

## 2016-04-06 MED ORDER — INSULIN ASPART 100 UNIT/ML ~~LOC~~ SOLN
0.0000 [IU] | Freq: Every day | SUBCUTANEOUS | Status: DC
  Administered 2016-04-06: 5 [IU] via SUBCUTANEOUS
  Administered 2016-04-07: 4 [IU] via SUBCUTANEOUS
  Administered 2016-04-08: 2 [IU] via SUBCUTANEOUS
  Filled 2016-04-06: qty 2
  Filled 2016-04-06 (×2): qty 5

## 2016-04-06 MED ORDER — INSULIN ASPART 100 UNIT/ML ~~LOC~~ SOLN
10.0000 [IU] | Freq: Once | SUBCUTANEOUS | Status: AC
  Administered 2016-04-06: 10 [IU] via SUBCUTANEOUS
  Filled 2016-04-06: qty 10

## 2016-04-06 MED ORDER — VANCOMYCIN HCL IN DEXTROSE 750-5 MG/150ML-% IV SOLN
750.0000 mg | Freq: Two times a day (BID) | INTRAVENOUS | Status: DC
  Administered 2016-04-07 – 2016-04-08 (×4): 750 mg via INTRAVENOUS
  Filled 2016-04-06 (×6): qty 150

## 2016-04-06 MED ORDER — INSULIN ASPART PROT & ASPART (70-30 MIX) 100 UNIT/ML ~~LOC~~ SUSP
50.0000 [IU] | Freq: Once | SUBCUTANEOUS | Status: AC
Start: 2016-04-06 — End: 2016-04-06
  Administered 2016-04-06: 50 [IU] via SUBCUTANEOUS
  Filled 2016-04-06: qty 10

## 2016-04-06 MED ORDER — INSULIN ASPART 100 UNIT/ML ~~LOC~~ SOLN
0.0000 [IU] | Freq: Three times a day (TID) | SUBCUTANEOUS | Status: DC
  Administered 2016-04-07: 3 [IU] via SUBCUTANEOUS
  Administered 2016-04-07: 5 [IU] via SUBCUTANEOUS
  Administered 2016-04-07 – 2016-04-08 (×2): 7 [IU] via SUBCUTANEOUS
  Administered 2016-04-08: 9 [IU] via SUBCUTANEOUS
  Filled 2016-04-06: qty 5
  Filled 2016-04-06: qty 7
  Filled 2016-04-06: qty 20
  Filled 2016-04-06: qty 7
  Filled 2016-04-06: qty 3

## 2016-04-06 MED ORDER — INSULIN ASPART PROT & ASPART (70-30 MIX) 100 UNIT/ML ~~LOC~~ SUSP
SUBCUTANEOUS | Status: AC
  Administered 2016-04-06: 50 [IU] via SUBCUTANEOUS
  Filled 2016-04-06: qty 50

## 2016-04-06 MED ORDER — VANCOMYCIN HCL IN DEXTROSE 1-5 GM/200ML-% IV SOLN
1000.0000 mg | Freq: Once | INTRAVENOUS | Status: AC
  Administered 2016-04-06: 1000 mg via INTRAVENOUS
  Filled 2016-04-06: qty 200

## 2016-04-06 MED ORDER — OXYCODONE-ACETAMINOPHEN 5-325 MG PO TABS
1.0000 | ORAL_TABLET | Freq: Once | ORAL | Status: AC
  Administered 2016-04-06: 1 via ORAL
  Filled 2016-04-06: qty 1

## 2016-04-06 NOTE — ED Provider Notes (Addendum)
[facility] Emergency Department Provider Note  ____________________________________________   I have reviewed the triage vital signs and the nursing notes.   HISTORY  Chief Complaint Hyperglycemia    HPI Gregory Moody is a 40 y.o. male with a history of very poorly controlled diabetes with normal sugars and 2-300 range. Patient states that he had a sweet tea and other sugar containing items for lunch. He is otherwise feeling in his normal state of health when he tripped over a pale at work and landed on his right wrist. It is wrist pain that brought him in. Feels that he is at his baseline otherwise. He had no idea sugar was high he does not feel weak or otherwise unwell. He denies any other injury. Denies any closed head injury. Did not hit his head. Did not pass out. This is a non-syncopal trip over something on the floor. Patient is a cachectic in appearance, I did ask him about this he states it has been a gradual process over the last 10 years since he has been diagnosed with diabetes. He has not had any night sweats or cough or other symptoms of possible CVA.     Past Medical History  Diagnosis Date  . Diabetes mellitus without complication ([place])     There are no active problems to display for this patient.   History reviewed. No pertinent past surgical history.  Current Outpatient Rx  Name  Route  Sig  Dispense  Refill  . HYDROcodone-acetaminophen (NORCO/VICODIN) 5-325 MG per tablet   Oral   Take 1 tablet by mouth every 4 (four) hours as needed for moderate pain.   20 tablet   0   . ibuprofen (ADVIL,MOTRIN) 600 MG tablet   Oral   Take 1 tablet (600 mg total) by mouth every 6 (six) hours as needed for mild pain or moderate pain.   30 tablet   0     Allergies Review of patient's allergies indicates no known allergies.  No family history on file.  Social History Social History  Substance Use Topics  . Smoking status: Never  Smoker   . Smokeless tobacco: None  . Alcohol Use: No    Review of Systems Constitutional: No fever/chills Eyes: No visual changes. ENT: No sore throat. No stiff neck no neck pain Cardiovascular: Denies chest pain. Respiratory: Denies shortness of breath. Gastrointestinal:   no vomiting.  No diarrhea.  No constipation. Genitourinary: Negative for dysuria. Musculoskeletal: Negative lower extremity swelling Skin: Negative for rash. Neurological: Negative for headaches, focal weakness or numbness. 10-point ROS otherwise negative.  ____________________________________________   PHYSICAL EXAM:  VITAL SIGNS: ED Triage Vitals  Enc Vitals Group     BP 04/06/16 1553 119/80 mmHg     Pulse Rate 04/06/16 1553 87     Resp 04/06/16 1553 18     Temp 04/06/16 1553 97.8 F (36.6 C)     Temp Source 04/06/16 1553 Oral     SpO2 04/06/16 1553 99 %     Weight 04/06/16 1553 119 lb (53.978 kg)     Height --      Head Cir --      Peak Flow --      Pain Score 04/06/16 1554 8     Pain Loc --      Pain Edu? --      Excl. in [place]? --     Constitutional: Alert and oriented. Well appearing and in no acute distress.Patient is cachectic Eyes: Conjunctivae  are normal. PERRL. EOMI. Head: Atraumatic. Nose: No congestion/rhinnorhea. Mouth/Throat: Mucous membranes are moist.  Oropharynx non-erythematous. Neck: No stridor.   Nontender with no meningismus Cardiovascular: Normal rate, regular rhythm. Grossly normal heart sounds.  Good peripheral circulation. Respiratory: Normal respiratory effort.  No retractions. Lungs CTAB. Abdominal: Soft and nontender. No distention. No guarding no rebound Back:  There is no focal tenderness or step off there is no midline tenderness there are no lesions noted. there is no CVA tenderness Musculoskeletal: No lower extremity tenderness. No joint effusions, no DVT signs strong distal pulses no edema there is sinus palpation in the right wrist without obvious  deformity. Neurologic:  Normal speech and language. No gross focal neurologic deficits are appreciated.  Skin:  Skin is warm, dry and intact. No rash noted. Psychiatric: Mood and affect are normal. Speech and behavior are normal.  ____________________________________________   LABS (all labs ordered are listed, but only abnormal results are displayed)  Labs Reviewed  GLUCOSE, CAPILLARY - Abnormal; Notable for the following:    Glucose-Capillary >600 (*)    All other components within normal limits  CBC - Abnormal; Notable for the following:    RBC 3.53 (*)    Hemoglobin 11.8 (*)    HCT 32.9 (*)    All other components within normal limits  BASIC METABOLIC PANEL  URINALYSIS COMPLETEWITH MICROSCOPIC ([facility] ONLY)  RAPID HIV SCREEN (HIV 1/2 AB+AG)  CBG MONITORING, ED   ____________________________________________  EKG  I personally interpreted any EKGs ordered by me or triage  ____________________________________________  RADIOLOGY  I reviewed any imaging ordered by me or triage that were performed during my shift and, if possible, patient and/or family made aware of any abnormal findings. ____________________________________________   PROCEDURES  Procedure(s) performed: None  Critical Care performed: None  ____________________________________________   INITIAL IMPRESSION / ASSESSMENT AND PLAN / ED COURSE  Pertinent labs & imaging results that were available during my care of the patient were reviewed by me and considered in my medical decision making (see chart for details).  Patient had a non-syncopal fall with a wrist injury, we will x-ray the wrist. He is quite cachectic, I will get a chest x-ray as a precaution as well as check basic blood work because of his elevated glucose. His glucose is elevated likely because he had sweet tea leaving before 911 was called. Does not appear that that in his opinion contributed any way to his fall and he is asystematic from  that point of view. Patient is very poorly controlled sugar and we have consulted on this. He does state that he is compliant with his insulin and he does have a doctor. We'll also send an HIV test as a screening test as given his cachectic state. He is otherwise not complaining of any other injury from his fall and he is awake and alert and in no acute distress. We will give him IV fluids and insulin.  ----------------------------------------- 6:21 PM on 04/06/2016 -----------------------------------------  Returning down, does have a mild anion gap which I expect to close with fluids and insulin which we have administered. Patient has a pneumonia. Given his pneumonia and elevated sugars or think he will benefit from IV antibiotics which we are initiating here. Hospitalist agrees with plan. ____________________________________________   FINAL CLINICAL IMPRESSION(S) / ED DIAGNOSES  Final diagnoses:  None      This chart was dictated using voice recognition software.  Despite best efforts to proofread,  errors can occur which can change meaning.  Schuyler Amor, MD 04/06/16 1635  Schuyler Amor, MD 04/06/16 Vernelle Emerald

## 2016-04-06 NOTE — ED Notes (Signed)
Interpreter paged for completion of urine drug screen

## 2016-04-06 NOTE — ED Notes (Signed)
Patient stating at this time that he is unable to provide urine sample. Amber, RN made aware and interpreter request cancelled. Patient states that he will let staff know when he has to urinate.

## 2016-04-06 NOTE — ED Notes (Signed)
UDS done with interpreter Hiram present.

## 2016-04-06 NOTE — Progress Notes (Signed)
ANTIBIOTIC CONSULT NOTE - INITIAL  Pharmacy Consult for Vancomycin  Indication: pneumonia  No Known Allergies  Patient Measurements: Height: 5\' 8"  (172.7 cm) Weight: 111 lb 9.6 oz (50.621 kg) IBW/kg (Calculated) : 68.4 Adjusted Body Weight: 61.28  Vital Signs: Temp: 97.4 F (36.3 C) (06/27 2038) Temp Source: Oral (06/27 1553) BP: 102/67 mmHg (06/27 2038) Pulse Rate: 84 (06/27 2038) Intake/Output from previous day:   Intake/Output from this shift:    Labs:  Recent Labs  04/06/16 1557  WBC 4.7  HGB 11.8*  PLT 272  CREATININE 0.74   Estimated Creatinine Clearance: 87.8 mL/min (by C-G formula based on Cr of 0.74). No results for input(s): VANCOTROUGH, VANCOPEAK, VANCORANDOM, GENTTROUGH, GENTPEAK, GENTRANDOM, TOBRATROUGH, TOBRAPEAK, TOBRARND, AMIKACINPEAK, AMIKACINTROU, AMIKACIN in the last 72 hours.   Microbiology: No results found for this or any previous visit (from the past 720 hour(s)).  Medical History: Past Medical History  Diagnosis Date  . Diabetes mellitus without complication ([place])     Medications:  Prescriptions prior to admission  Medication Sig Dispense Refill Last Dose  . insulin NPH-regular Human (NOVOLIN 70/30) (70-30) 100 UNIT/ML injection Inject 15 Units into the skin 2 (two) times daily with a meal.   04/06/2016 at Unknown time  . metFORMIN (GLUCOPHAGE) 1000 MG tablet Take 1,000 mg by mouth 2 (two) times daily with a meal.   04/06/2016 at Unknown time   Assessment: CrCl = 95 ml/min ke = 0.083 hr-1 T1/2 = 8.35 hrs Vd = 35.4 L   Goal of Therapy:  Vancomycin trough level 15-20 mcg/ml  Plan:  Expected duration 7 days with resolution of temperature and/or normalization of WBC   Vancomycin 1 gm IV X 1 given on 6/27 @ 20:00.  Vancomycin 750 mg IV Q12H to start on 6/28 @ 2:00, 6 hrs after 1st dose (stacked dosing). Will draw 1st trough on 6/29 @ 13:30, which will be at [facility].   Lindsey Hommel D 04/06/2016,8:57 PM

## 2016-04-06 NOTE — H&P (Signed)
[facility] NAME: Gregory Moody    MR#:  [id number]  DATE OF BIRTH:  Jul 16, 1976  DATE OF ADMISSION:  04/06/2016  PRIMARY CARE PHYSICIAN: No primary care provider on file.   REQUESTING/REFERRING PHYSICIAN: Dr. Burlene Arnt  CHIEF COMPLAINT:   Patient came in with right wrist pain after a fall at work.  HISTORY OF PRESENT ILLNESS:  Gregory Moody  is a 40 y.o. male with a known history of Diabetes. He came in with right wrist pain and right rib pain after having a fall at work. In the ER, he was found to have a very elevated sugar and slightly elevated anion gap. On chest x-rays found to have a pneumonia. CAT scan of the chest was showing a cavitary pneumonia. The patient has had a 50 pound weight loss in the past month. Does not complain of a cough or night sweats. He does have joint pains all over his body. He has been in this country for the last 7 years and no recent travel out of the country. No exposure to anybody with tuberculosis that he knows of. Spanish-speaking interpreter provided by the hospital.  PAST MEDICAL HISTORY:   Past Medical History  Diagnosis Date  . Diabetes mellitus without complication ([place])     PAST SURGICAL HISTORY:   Past Surgical History  Procedure Laterality Date  . No past surgeries      SOCIAL HISTORY:   Social History  Substance Use Topics  . Smoking status: Never Smoker   . Smokeless tobacco: Not on file  . Alcohol Use: No    FAMILY HISTORY:   Family History  Problem Relation Age of Onset  . Diabetes Mother   . Diabetes Father     DRUG ALLERGIES:  No Known Allergies  REVIEW OF SYSTEMS:  CONSTITUTIONAL: No fever, fatigue or weakness. Positive for 50 pound weight loss in 1 month. EYES: Occasional blurred vision EARS, NOSE, AND THROAT: No tinnitus or ear pain. No sore throat RESPIRATORY: No cough, shortness of breath, wheezing or hemoptysis.  CARDIOVASCULAR:  No chest pain. Positive for edema. Positive for right rib pain GASTROINTESTINAL: No nausea, vomiting. No blood in bowel movements. Positive for abdominal pain and diarrhea when he drinks cold beverages with ice. GENITOURINARY: No dysuria, hematuria.  ENDOCRINE: No polyuria, nocturia,  HEMATOLOGY: No anemia, easy bruising or bleeding SKIN: No rash or lesion. Some itching. MUSCULOSKELETAL: Positive for joint pains heals up to his knees.   NEUROLOGIC: No tingling, numbness, weakness.  PSYCHIATRY: Occasional depression depression.   MEDICATIONS AT HOME:   Prior to Admission medications   Medication Sig Start Date End Date Taking? Authorizing Provider  insulin NPH-regular Human (NOVOLIN 70/30) (70-30) 100 UNIT/ML injection Inject 15 Units into the skin 2 (two) times daily with a meal.   Yes Historical Provider, MD  metFORMIN (GLUCOPHAGE) 1000 MG tablet Take 1,000 mg by mouth 2 (two) times daily with a meal.   Yes Historical Provider, MD      VITAL SIGNS:  Blood pressure 132/88, pulse 65, temperature 97.8 F (36.6 C), temperature source Oral, resp. rate 13, weight 53.978 kg (119 lb), SpO2 100 %.  PHYSICAL EXAMINATION:  GENERAL:  40 y.o.-year-old patient lying in the bed with no acute distress. Looks cachectic. EYES: Pupils equal, round, reactive to light and accommodation. No scleral icterus. Extraocular muscles intact.  HEENT: Head atraumatic, normocephalic. Oropharynx and nasopharynx clear.  NECK:  Supple, no jugular venous distention. No thyroid enlargement, no  tenderness.  LUNGS: Normal breath sounds bilaterally, no wheezing, rales,rhonchi or crepitation. No use of accessory muscles of respiration.  CARDIOVASCULAR: S1, S2 normal. No murmurs, rubs, or gallops.  ABDOMEN: Soft, nontender, nondistended. Bowel sounds present. No organomegaly or mass.  EXTREMITIES: Trace pedal edema. No cyanosis, or clubbing.  NEUROLOGIC: Cranial nerves II through XII are intact. Muscle strength 5/5 in all  extremities. Sensation intact. Gait not checked.  PSYCHIATRIC: The patient is alert and oriented x 3.  SKIN: No rash, lesion, or ulcer.   LABORATORY PANEL:   CBC  Recent Labs Lab 04/06/16 1557  WBC 4.7  HGB 11.8*  HCT 32.9*  PLT 272   ------------------------------------------------------------------------------------------------------------------  Chemistries   Recent Labs Lab 04/06/16 1557  NA 128*  K 3.9  CL 91*  CO2 21*  GLUCOSE 632*  BUN 20  CREATININE 0.74  CALCIUM 9.1   ------------------------------------------------------------------------------------------------------------------    RADIOLOGY:  Dg Chest 2 View  04/06/2016  CLINICAL DATA:  Right site chest wall pain. EXAM: CHEST  2 VIEW COMPARISON:  None. FINDINGS: Normal heart size. No pleural effusion or edema. Airspace consolidation is identified within the right upper lobe compatible with pneumonia. Left lung is clear. IMPRESSION: 1. Right upper lobe pneumonia. Followup PA and lateral chest X-ray is recommended in 3-4 weeks following trial of antibiotic therapy to ensure resolution and exclude underlying malignancy. Electronically Signed   By: Kerby Moors M.D.   On: 04/06/2016 16:38   Dg Wrist Complete Right  04/06/2016  CLINICAL DATA:  Fall.  Diffuse right wrist pain. EXAM: RIGHT WRIST - COMPLETE 3+ VIEW COMPARISON:  None. FINDINGS: There is no evidence of fracture or dislocation. There is no evidence of arthropathy or other focal bone abnormality. Soft tissues are unremarkable. IMPRESSION: Negative. Electronically Signed   By: Kerby Moors M.D.   On: 04/06/2016 16:47   Ct Chest W Contrast  04/06/2016  CLINICAL DATA:  Right upper lobe opacity on chest radiograph. Right-sided chest pain. EXAM: CT CHEST WITH CONTRAST TECHNIQUE: Multidetector CT imaging of the chest was performed during intravenous contrast administration. CONTRAST:  75 mL Isovue 370 COMPARISON:  Chest radiograph on 04/06/2016 FINDINGS:  Mediastinum/Lymph Nodes: No masses, pathologically enlarged lymph nodes, or other significant abnormality. Lungs/Pleura: Ill-defined area of consolidation is seen in the peripheral right upper lobe which shows air bronchograms and tiny cystic lucencies which may represent early cavitation. There are also adjacent patchy areas of airspace disease and tiny sub-cm nodular densities in the right upper lobe and superior segment of the right lower lobe. These findings are suspicious for pneumonia. No evidence pleural effusion. Central tracheobronchial airways are patent. Upper abdomen: No acute findings. Musculoskeletal: No chest wall mass or suspicious bone lesions identified. IMPRESSION: Focal consolidation in peripheral right upper lobe with central air bronchograms and possible early cavitary changes. These findings are most consistent with pneumonia. Followup PA and lateral chest X-ray is recommended in 3-4 weeks following trial of antibiotic therapy to ensure resolution and exclude underlying malignancy. No evidence of lymphadenopathy or pleural effusion. Electronically Signed   By: Earle Gell M.D.   On: 04/06/2016 17:51    IMPRESSION AND PLAN:   1. Uncontrolled diabetes mellitus. Patient given 3 L of IV fluids and some IV insulin. I'll try to get him back on his normal 70/30 insulin and sliding scale. 2. Cavitary lung lesion with pneumonia and weight loss. Rule out tuberculosis with AFBs 3. Pulmonary consultation. May end up needing a bronchoscopy. Give antibiotics with vancomycin and Levaquin. Sputum  culture. 3. Hyponatremia likely secondary to uncontrolled diabetes. 4. Right wrist pain and rib pain. X-ray of the right wrist is negative.    All the records are reviewed and case discussed with ED provider. Management plans discussed with the patient, family and they are in agreement.  CODE STATUS: Full code  TOTAL TIME TAKING CARE OF THIS PATIENT: 55 minutes.    Loletha Grayer M.D on  04/06/2016 at 7:15 PM  Between 7am to 6pm - Pager - [phone]  After 6pm call admission pager [phone]  [facility]  [phone]  CC: Primary care physician; No primary care provider on file.

## 2016-04-06 NOTE — ED Notes (Signed)
Pt back from x-ray.

## 2016-04-06 NOTE — ED Notes (Signed)
Pt tripped and fell at work over a pallet, pt reports pain to rt wrist. When 911 was called Fire responded first and checked blood glucose level and it was noted to be reading "HIGH". Pt is diabetic and does insulin and metformin at home. EMS administered 523ml NS pta.

## 2016-04-07 DIAGNOSIS — R64 Cachexia: Secondary | ICD-10-CM

## 2016-04-07 DIAGNOSIS — J189 Pneumonia, unspecified organism: Principal | ICD-10-CM

## 2016-04-07 DIAGNOSIS — J984 Other disorders of lung: Secondary | ICD-10-CM

## 2016-04-07 LAB — BASIC METABOLIC PANEL
Anion gap: 8 (ref 5–15)
BUN: 11 mg/dL (ref 6–20)
CHLORIDE: 100 mmol/L — AB (ref 101–111)
CO2: 25 mmol/L (ref 22–32)
CREATININE: 0.37 mg/dL — AB (ref 0.61–1.24)
Calcium: 8.6 mg/dL — ABNORMAL LOW (ref 8.9–10.3)
GFR calc non Af Amer: >60 mL/min (ref >60)
Glucose, Bld: 252 mg/dL — ABNORMAL HIGH (ref 65–99)
POTASSIUM: 3.4 mmol/L — AB (ref 3.5–5.1)
Sodium: 133 mmol/L — ABNORMAL LOW (ref 135–145)

## 2016-04-07 LAB — CBC
HCT: 32.9 % — ABNORMAL LOW (ref 40.0–52.0)
HEMATOCRIT: 30.4 % — AB (ref 40.0–52.0)
HEMOGLOBIN: 11.8 g/dL — AB (ref 13.0–18.0)
Hemoglobin: 11.2 g/dL — ABNORMAL LOW (ref 13.0–18.0)
MCH: 33.4 pg (ref 26.0–34.0)
MCH: 33.8 pg (ref 26.0–34.0)
MCHC: 35.8 g/dL (ref 32.0–36.0)
MCHC: 36.9 g/dL — ABNORMAL HIGH (ref 32.0–36.0)
MCV: 93.3 fL (ref 80.0–100.0)
MCV: 91.6 fL (ref 80.0–100.0)
PLATELETS: 259 10*3/uL (ref 150–440)
Platelets: 272 10*3/uL (ref 150–440)
RBC: 3.53 MIL/uL — ABNORMAL LOW (ref 4.40–5.90)
RBC: 3.32 MIL/uL — AB (ref 4.40–5.90)
RDW: 12.5 % (ref 11.5–14.5)
RDW: 12.4 % (ref 11.5–14.5)
WBC: 4.7 10*3/uL (ref 3.8–10.6)
WBC: 4.7 10*3/uL (ref 3.8–10.6)

## 2016-04-07 LAB — GLUCOSE, CAPILLARY
GLUCOSE-CAPILLARY: 260 mg/dL — AB (ref 65–99)
GLUCOSE-CAPILLARY: 230 mg/dL — AB (ref 65–99)
GLUCOSE-CAPILLARY: 318 mg/dL — AB (ref 65–99)
GLUCOSE-CAPILLARY: 345 mg/dL — AB (ref 65–99)

## 2016-04-07 LAB — TSH: TSH: 1.918 u[IU]/mL (ref 0.350–4.500)

## 2016-04-07 MED ORDER — OXYCODONE HCL 5 MG PO TABS: 5.0000 mg | ORAL_TABLET | ORAL | Status: DC | PRN

## 2016-04-07 MED ORDER — SODIUM CHLORIDE 3 % IN NEBU
4.0000 mL | INHALATION_SOLUTION | Freq: Once | RESPIRATORY_TRACT | Status: AC
  Administered 2016-04-07: 4 mL via RESPIRATORY_TRACT
  Filled 2016-04-07 (×2): qty 4

## 2016-04-07 MED ORDER — SODIUM CHLORIDE 3 % IN NEBU
4.0000 mL | INHALATION_SOLUTION | Freq: Once | RESPIRATORY_TRACT | Status: AC
  Administered 2016-04-08: 4 mL via RESPIRATORY_TRACT
  Filled 2016-04-07: qty 4

## 2016-04-07 MED ORDER — ALBUTEROL SULFATE (2.5 MG/3ML) 0.083% IN NEBU
2.5000 mg | INHALATION_SOLUTION | Freq: Once | RESPIRATORY_TRACT | Status: AC
  Administered 2016-04-08: 2.5 mg via RESPIRATORY_TRACT
  Filled 2016-04-07: qty 3

## 2016-04-07 NOTE — Progress Notes (Signed)
Inpatient Diabetes Program Recommendations  AACE/ADA: New Consensus Statement on Inpatient Glycemic Control (2015)  Target Ranges:  Prepandial:   less than 140 mg/dL      Peak postprandial:   less than 180 mg/dL (1-2 hours)      Critically ill patients:  140 - 180 mg/dL  Results for Gregory Moody, Gregory Moody (MRN [id number]) as of 04/07/2016 12:53  Ref. Range 04/06/2016 15:48 04/06/2016 17:57 04/06/2016 20:12 04/06/2016 21:58 04/07/2016 07:56 04/07/2016 11:28  Glucose-Capillary Latest Ref Range: 65-99 mg/dL >600 (HH) 407 (H) 344 (H) 381 (H) 260 (H) 230 (H)   Results for Gregory Moody, Gregory Moody (MRN [id number]) as of 04/07/2016 12:53  Ref. Range 04/06/2016 15:57 04/07/2016 04:19  Glucose Latest Ref Range: 65-99 mg/dL 632 (HH) 252 (H)   Review of Glycemic Control  Diabetes history: DM Outpatient Diabetes medications: Novolin 70/30 50 units BID, Metformin 1000 mg BID Current orders for Inpatient glycemic control: 70/30 50 units BID, Novolog 0-9 units TID with meals, Novolog 0-5 units QHS  Inpatient Diabetes Program Recommendations: Insulin - Basal: Please consider increasing 70/30 to 55 units BID. HgbA1C: Please consider adding an A1C to blood in lab to evaluate glycemic control over the past 2-3 months.  NOTE: Spoke with patient through an interpreter about diabetes and home regimen for diabetes control. Patient reports that he is followed by [facility] for diabetes management and currently he takes Novolin 70/30 50 units BID and Metformin 1000 mg BID as an outpatient for diabetes control. Patient reports that he usually takes insulin and Metformin as prescribed. Inquired about possibilities of why glucose was 632 mg/dl on initial presentation to the hospital. Patient admits that he did not take any insulin in the past couple of days.  Patient reports that he has only been on insulin for about 1 month. Patient states that he usually checks his glucose 1-2 times per day but sometimes  he is not able to check his glucose before work. Patient reports that over the past couple of weeks his glucose has been in the 200-300's mg/dl.  Inquired about prior A1C and patient reports that he does not recall his last A1C value.  Discussed importance of checking CBGs and maintaining good CBG control to prevent long-term and short-term complications. Explained how hyperglycemia leads to damage within blood vessels which lead to the common complications seen with uncontrolled diabetes. Stressed to the patient the importance of improving glycemic control to prevent further complications from uncontrolled diabetes.  Encouraged patient to check his glucose as recommended by his doctor and to be sure he is taking insulin as prescribed.  Patient verbalized understanding of information discussed and he states that he has no further questions at this time related to diabetes.  Thanks, Barnie Alderman, RN, MSN, CDE Diabetes Coordinator Inpatient Diabetes Program [phone] (Team Pager) [phone] (AP office) [phone] [facility] office) [phone] [facility] office)

## 2016-04-07 NOTE — Consult Note (Signed)
Name: Gregory Moody MRN: [id number] DOB: 17-Sep-1976    ADMISSION DATE:  04/06/2016 CONSULTATION DATE:  04/07/16  REFERRING MD :  Loletha Grayer  CHIEF COMPLAINT: Cavitary lung lesion  BRIEF PATIENT DESCRIPTION:  40 year old male with a history of diabetes presented after a fall and found to have a cavitated lesion on x-ray.  SIGNIFICANT EVENTS  6/27>> patient was admitted to [facility] with fall and high blood glucose. Chest x-ray concerning for right upper lung mass.  STUDIES:  6/28 CT chest >>Focal consolidation in peripheral right upper lobe with central air bronchograms and possible early cavitary changes. These findings aremost consistent with pneumonia. Followup PA and lateral chest X-ray is recommended in 3-4 weeks following trial of antibiotic therapy to ensure resolution and exclude underlying malignancy. No evidence of lymphadenopathy or pleural effusion.  HISTORY OF PRESENT ILLNESS:  Gregory Moody ago Gregory Moody is a 40 year old [name] with known history of diabetes. He presented to [facility] on 6/27 with right wrist pain and right sided chest pain more towards the rib after having a fall at work. Patient was hyperglycemic on arrival to the ED. Patient has lost 50 pounds weight in the last month. Denies any travel outside the country, night sweats, fever, chills and cough. Patient had a CT of the chest which was concerning for Focal consolidation in peripheral right upper lobe with central air bronchograms and possible early cavitary changes. Therefore [facility] M team was consulted for further management.  PAST MEDICAL HISTORY :   has a past medical history of Diabetes mellitus without complication ([place]).  has past surgical history that includes No past surgeries. Prior to Admission medications   Medication Sig Start Date End Date Taking? Authorizing Provider  insulin NPH-regular Human (NOVOLIN 70/30) (70-30) 100 UNIT/ML  injection Inject 50 Units into the skin 2 (two) times daily with a meal.    Yes Historical Provider, MD  metFORMIN (GLUCOPHAGE) 1000 MG tablet Take 1,000 mg by mouth 2 (two) times daily with a meal.   Yes Historical Provider, MD   No Known Allergies  FAMILY HISTORY:  family history includes Diabetes in his father and mother. SOCIAL HISTORY:  reports that he has never smoked. He does not have any smokeless tobacco history on file. He reports that he does not drink alcohol or use illicit drugs.  REVIEW OF SYSTEMS:   Review of Systems  Constitutional: Positive for weight loss and malaise/fatigue. Negative for fever, chills and diaphoresis.  HENT: Negative for congestion and sore throat.   Eyes: Negative for blurred vision and double vision.  Respiratory: Negative for cough, hemoptysis, sputum production, shortness of breath, wheezing and stridor.   Cardiovascular: Negative for chest pain, palpitations and orthopnea.  Genitourinary: Negative for urgency, frequency and hematuria.  Musculoskeletal: Negative for back pain and neck pain.  Skin: Negative for itching and rash.  Neurological: Positive for weakness. Negative for sensory change and speech change.  Psychiatric/Behavioral: Negative for hallucinations and substance abuse.    SUBJECTIVE:  Patient states that he feels weak , he had a fall at work but feels better now.  Patient denies any chest pain, shortness of breath.  VITAL SIGNS: Temp:  [97.4 F (36.3 C)-98.1 F (36.7 C)] 98.1 F (36.7 C) (06/28 1132) Pulse Rate:  [63-87] 84 (06/28 1132) Resp:  [11-20] 17 (06/28 1132) BP: (87-132)/(52-88) 87/52 mmHg (06/28 1133) SpO2:  [93 %-100 %] 100 % (06/28 1132) Weight:  [111 lb 9.6 oz (50.621 kg)-119 lb (53.978 kg)]  111 lb 9.6 oz (50.621 kg) (06/27 2000)  PHYSICAL EXAMINATION: General: Cachectic, [name], on room air, in no acute distress. Neuro:  Awake, alert, oriented, follows command, no focal deficits HEENT:  Atraumatic,  Normocephalic, no discharge, PERRLA  Cardiovascular: S1S2, regular, no MRG noted. Lungs:  Clear bilaterally, no wheezes, crackles, rhonchi noted Abdomen: flat ,Soft  and nontentender Musculoskeletal:  No inflammation/ deformity noted, normal range of motion Skin: no rash, wounds, ulcer noted.   Recent Labs Lab 04/06/16 1557 04/07/16 0419  NA 128* 133*  K 3.9 3.4*  CL 91* 100*  CO2 21* 25  BUN 20 11  CREATININE 0.74 0.37*  GLUCOSE 632* 252*    Recent Labs Lab 04/06/16 1557 04/07/16 0419  HGB 11.8* 11.2*  HCT 32.9* 30.4*  WBC 4.7 4.7  PLT 272 259   Dg Chest 2 View  04/06/2016  CLINICAL DATA:  Right site chest wall pain. EXAM: CHEST  2 VIEW COMPARISON:  None. FINDINGS: Normal heart size. No pleural effusion or edema. Airspace consolidation is identified within the right upper lobe compatible with pneumonia. Left lung is clear. IMPRESSION: 1. Right upper lobe pneumonia. Followup PA and lateral chest X-ray is recommended in 3-4 weeks following trial of antibiotic therapy to ensure resolution and exclude underlying malignancy. Electronically Signed   By: Kerby Moors M.D.   On: 04/06/2016 16:38   Dg Wrist Complete Right  04/06/2016  CLINICAL DATA:  Fall.  Diffuse right wrist pain. EXAM: RIGHT WRIST - COMPLETE 3+ VIEW COMPARISON:  None. FINDINGS: There is no evidence of fracture or dislocation. There is no evidence of arthropathy or other focal bone abnormality. Soft tissues are unremarkable. IMPRESSION: Negative. Electronically Signed   By: Kerby Moors M.D.   On: 04/06/2016 16:47   Ct Chest W Contrast  04/06/2016  CLINICAL DATA:  Right upper lobe opacity on chest radiograph. Right-sided chest pain. EXAM: CT CHEST WITH CONTRAST TECHNIQUE: Multidetector CT imaging of the chest was performed during intravenous contrast administration. CONTRAST:  75 mL Isovue 370 COMPARISON:  Chest radiograph on 04/06/2016 FINDINGS: Mediastinum/Lymph Nodes: No masses, pathologically enlarged lymph  nodes, or other significant abnormality. Lungs/Pleura: Ill-defined area of consolidation is seen in the peripheral right upper lobe which shows air bronchograms and tiny cystic lucencies which may represent early cavitation. There are also adjacent patchy areas of airspace disease and tiny sub-cm nodular densities in the right upper lobe and superior segment of the right lower lobe. These findings are suspicious for pneumonia. No evidence pleural effusion. Central tracheobronchial airways are patent. Upper abdomen: No acute findings. Musculoskeletal: No chest wall mass or suspicious bone lesions identified. IMPRESSION: Focal consolidation in peripheral right upper lobe with central air bronchograms and possible early cavitary changes. These findings are most consistent with pneumonia. Followup PA and lateral chest X-ray is recommended in 3-4 weeks following trial of antibiotic therapy to ensure resolution and exclude underlying malignancy. No evidence of lymphadenopathy or pleural effusion. Electronically Signed   By: Earle Gell M.D.   On: 04/06/2016 17:51    ASSESSMENT / PLAN:  -Cavitary lesion right upper lobe  - Pneumonia -?Tuberculosis  -Continue Vancomycin/ levofloxacin -Follow AFB   Bincy Varughese,AG-ACNP Pulmonary and [place]   04/07/2016, 3:06 PM  STAFF NOTE: I, Dr. Vilinda Boehringer have personally reviewed patient's available data, including medical history, events of note, physical examination and test results as part of my evaluation. I have discussed with resident/NP and other care providers such as pharmacist, RN and  RRT.  In addition,  I personally evaluated patient and elicited key findings of   HPI: 40yo [name] past medical history of diabetes, seen in consultation for abnormal chest x-ray and CT scan with right upper lobe pneumonia. History per patient via Spanish interpreter area patient noted by the hospitalist team, due to abnormal  chest x-ray and weight loss over the past 3 months. Patient had an accidental fall about 2 days ago, presented to the ED after he hurt his right wrist, complained of weight loss, and was noted to have elevated glucose levels, this led to a chest x-ray which showed a right upper lobe pneumonia, following a chest CT which showed a right upper lobe cavitary lesion. Pulmonary was consulted for further evaluation. She does have risk factors for TB including multiple incarcerations about 10 years ago in [place] or [place] and [place]. He states he is consistently lived in the [place] for the past 7 years, but has been back and forth between the [place] and [place] for the past 20 years. 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 Most of his jobs have been between [place], [place] and [place]. He does not endorse any hemoptysis, fever, chills, or night sweats. He denies tobacco use, denies any illegal drug use      (The following images and results were reviewed by Dr. Stevenson Clinch on 04/07/2016). Dg Chest 2 View  04/06/2016  CLINICAL DATA:  Right site chest wall pain. EXAM: CHEST  2 VIEW COMPARISON:  None. FINDINGS: Normal heart size. No pleural effusion or edema. Airspace consolidation is identified within the right upper lobe compatible with pneumonia. Left lung is clear. IMPRESSION: 1. Right upper lobe pneumonia. Followup PA and lateral chest X-ray is recommended in 3-4 weeks following trial of antibiotic therapy to ensure resolution and exclude underlying malignancy. Electronically Signed   By: Kerby Moors M.D.   On: 04/06/2016 16:38   Dg Wrist Complete Right  04/06/2016  CLINICAL DATA:  Fall.  Diffuse right wrist pain. EXAM: RIGHT WRIST - COMPLETE 3+ VIEW COMPARISON:  None. FINDINGS: There is no evidence of fracture or dislocation. There is no evidence of arthropathy or other focal bone abnormality. Soft tissues are unremarkable. IMPRESSION: Negative.  Electronically Signed   By: Kerby Moors M.D.   On: 04/06/2016 16:47   Ct Chest W Contrast  04/06/2016  CLINICAL DATA:  Right upper lobe opacity on chest radiograph. Right-sided chest pain. EXAM: CT CHEST WITH CONTRAST TECHNIQUE: Multidetector CT imaging of the chest was performed during intravenous contrast administration. CONTRAST:  75 mL Isovue 370 COMPARISON:  Chest radiograph on 04/06/2016 FINDINGS: Mediastinum/Lymph Nodes: No masses, pathologically enlarged lymph nodes, or other significant abnormality. Lungs/Pleura: Ill-defined area of consolidation is seen in the peripheral right upper lobe which shows air bronchograms and tiny cystic lucencies which may represent early cavitation. There are also adjacent patchy areas of airspace disease and tiny sub-cm nodular densities in the right upper lobe and superior segment of the right lower lobe. These findings are suspicious for pneumonia. No evidence pleural effusion. Central tracheobronchial airways are patent. Upper abdomen: No acute findings. Musculoskeletal: No chest wall mass or suspicious bone lesions identified. IMPRESSION: Focal consolidation in peripheral right upper lobe with central air bronchograms and possible early cavitary changes. These findings are most consistent with pneumonia. Followup PA and lateral chest X-ray is recommended in 3-4 weeks following trial of antibiotic therapy to ensure resolution and exclude underlying malignancy. No evidence  of lymphadenopathy or pleural effusion. Electronically Signed   By: Earle Gell M.D.   On: 04/06/2016 17:16      A: 40 year old [name] with right upper lobe cavitary lesion/pneumonia.  Right upper lobe pneumonia Cachexia Risk factors for TB Diabetes  P:   -Risk factors for TB include living in endemic country, history of being incarcerated. -Patient also has findings on CT scan are concerning for mycobacterial and fungal infection of the differential. -Continue with AFB  collection and regular sputum culture. We'll also collect urine culture for fungus and urine histoplasma antigen. -If unable to collect adequate sputum sample will then consider bronchoscopy.   [name]  Rest per NP/medical resident whose note is outlined above and that I agree with  Pulmonary Care Time devoted to patient care services described in this note is 45 Minutes.   This time reflects time of care of this signee Dr Vilinda Boehringer.  This critical care time does not reflect procedure time, or teaching time or supervisory time of PA/NP/Med-student/Med Resident etc but could involve care discussion time.  Vilinda Boehringer, MD [facility] Pager [phone] (please enter 7-digits) On Call Pager [phone] (please enter 7-digits)  Note: This note was prepared with Dragon dictation along with smaller phrase technology. Any transcriptional errors that result from this process are unintentional.

## 2016-04-07 NOTE — Progress Notes (Signed)
[facility] [place] NAME: Gregory Moody    MR#:  [id number]  DATE OF BIRTH:  1976-09-15  SUBJECTIVE:   Patient here with fall and wrist pain  REVIEW OF SYSTEMS:    Review of Systems  Constitutional: Positive for weight loss. Negative for fever, chills and malaise/fatigue.  HENT: Negative for ear discharge, ear pain, hearing loss, nosebleeds and sore throat.   Eyes: Negative for blurred vision and pain.  Respiratory: Negative for cough, hemoptysis, shortness of breath and wheezing.   Cardiovascular: Negative for chest pain, palpitations and leg swelling.  Gastrointestinal: Negative for nausea, vomiting, abdominal pain, diarrhea and blood in stool.  Genitourinary: Negative for dysuria.  Musculoskeletal: Positive for joint pain and falls. Negative for back pain.  Neurological: Negative for dizziness, tremors, speech change, focal weakness, seizures and headaches.  Endo/Heme/Allergies: Does not bruise/bleed easily.  Psychiatric/Behavioral: Negative for depression, suicidal ideas and hallucinations.    Tolerating Diet:yes      DRUG ALLERGIES:  No Known Allergies  VITALS:  Blood pressure 87/52, pulse 84, temperature 98.1 F (36.7 C), temperature source Oral, resp. rate 17, height 5\' 8"  (1.727 m), weight 50.621 kg (111 lb 9.6 oz), SpO2 100 %.  PHYSICAL EXAMINATION:   Physical Exam  Constitutional: He is oriented to person, place, and time and well-developed, well-nourished, and in no distress. No distress.  HENT:  Head: Normocephalic.  Eyes: No scleral icterus.  Neck: Normal range of motion. Neck supple. No JVD present. No tracheal deviation present.  Cardiovascular: Normal rate, regular rhythm and normal heart sounds.  Exam reveals no gallop and no friction rub.   No murmur heard. Pulmonary/Chest: Effort normal and breath sounds normal. No respiratory distress. He has no wheezes. He has no rales. He exhibits no tenderness.   Abdominal: Soft. Bowel sounds are normal. He exhibits no distension and no mass. There is no tenderness. There is no rebound and no guarding.  Musculoskeletal: Normal range of motion. He exhibits no edema.  Neurological: He is alert and oriented to person, place, and time.  Skin: Skin is warm. No rash noted. No erythema.  Psychiatric: Affect and judgment normal.      LABORATORY PANEL:   CBC  Recent Labs Lab 04/07/16 0419  WBC 4.7  HGB 11.2*  HCT 30.4*  PLT 259   ------------------------------------------------------------------------------------------------------------------  Chemistries   Recent Labs Lab 04/07/16 0419  NA 133*  K 3.4*  CL 100*  CO2 25  GLUCOSE 252*  BUN 11  CREATININE 0.37*  CALCIUM 8.6*   ------------------------------------------------------------------------------------------------------------------  Cardiac Enzymes No results for input(s): TROPONINI in the last 168 hours. ------------------------------------------------------------------------------------------------------------------  RADIOLOGY:  Dg Chest 2 View  04/06/2016  CLINICAL DATA:  Right site chest wall pain. EXAM: CHEST  2 VIEW COMPARISON:  None. FINDINGS: Normal heart size. No pleural effusion or edema. Airspace consolidation is identified within the right upper lobe compatible with pneumonia. Left lung is clear. IMPRESSION: 1. Right upper lobe pneumonia. Followup PA and lateral chest X-ray is recommended in 3-4 weeks following trial of antibiotic therapy to ensure resolution and exclude underlying malignancy. Electronically Signed   By: Kerby Moors M.D.   On: 04/06/2016 16:38   Dg Wrist Complete Right  04/06/2016  CLINICAL DATA:  Fall.  Diffuse right wrist pain. EXAM: RIGHT WRIST - COMPLETE 3+ VIEW COMPARISON:  None. FINDINGS: There is no evidence of fracture or dislocation. There is no evidence of arthropathy or other focal bone abnormality. Soft tissues are unremarkable.  IMPRESSION: Negative. Electronically Signed   By: Kerby Moors M.D.   On: 04/06/2016 16:47   Ct Chest W Contrast  04/06/2016  CLINICAL DATA:  Right upper lobe opacity on chest radiograph. Right-sided chest pain. EXAM: CT CHEST WITH CONTRAST TECHNIQUE: Multidetector CT imaging of the chest was performed during intravenous contrast administration. CONTRAST:  75 mL Isovue 370 COMPARISON:  Chest radiograph on 04/06/2016 FINDINGS: Mediastinum/Lymph Nodes: No masses, pathologically enlarged lymph nodes, or other significant abnormality. Lungs/Pleura: Ill-defined area of consolidation is seen in the peripheral right upper lobe which shows air bronchograms and tiny cystic lucencies which may represent early cavitation. There are also adjacent patchy areas of airspace disease and tiny sub-cm nodular densities in the right upper lobe and superior segment of the right lower lobe. These findings are suspicious for pneumonia. No evidence pleural effusion. Central tracheobronchial airways are patent. Upper abdomen: No acute findings. Musculoskeletal: No chest wall mass or suspicious bone lesions identified. IMPRESSION: Focal consolidation in peripheral right upper lobe with central air bronchograms and possible early cavitary changes. These findings are most consistent with pneumonia. Followup PA and lateral chest X-ray is recommended in 3-4 weeks following trial of antibiotic therapy to ensure resolution and exclude underlying malignancy. No evidence of lymphadenopathy or pleural effusion. Electronically Signed   By: Earle Gell M.D.   On: 04/06/2016 17:51     ASSESSMENT AND PLAN:   40 year old male with a history of diabetes who presented after a fall and found to have cavitary lesion on x-ray.   1. Uncontrolled diabetes: Blood sugars improved with IV fluids. Continue ADA diet, sliding scale insulin and NPH.  2. Cavitary lesion: Continue Levaquin and vancomycin. Continue isolation precautions to rule out  tuberculosis. Order quanteferon TB test. Continue AFB 3. Pulmonary consult. May need bronchoscopy 3. Fall with wrist pain: X-rays negative for acute fracture. Continue supportive care. 4. Hypokalemia: Replete.  5. Weight loss: TSH normal, HIV negative. Follow-up with problem #2. Management plans discussed with the patient and he is in agreement.  CODE STATUS: FULL  TOTAL TIME TAKING CARE OF THIS PATIENT: 30 minutes.     POSSIBLE D/C 3-5 days, DEPENDING ON CLINICAL CONDITION.   Loyalty Brashier M.D on 04/07/2016 at 1:37 PM  Between 7am to 6pm - Pager - [phone] After 6pm go to [email or web address] - password [email or web address] [facility]  Office  [phone]  CC: Primary care physician; No primary care provider on file.  Note: This dictation was prepared with Dragon dictation along with smaller phrase technology. Any transcriptional errors that result from this process are unintentional.

## 2016-04-08 DIAGNOSIS — J690 Pneumonitis due to inhalation of food and vomit: Secondary | ICD-10-CM

## 2016-04-08 LAB — GLUCOSE, CAPILLARY
GLUCOSE-CAPILLARY: 303 mg/dL — AB (ref 65–99)
GLUCOSE-CAPILLARY: 362 mg/dL — AB (ref 65–99)
GLUCOSE-CAPILLARY: 402 mg/dL — AB (ref 65–99)
Glucose-Capillary: 375 mg/dL — ABNORMAL HIGH (ref 65–99)
Glucose-Capillary: 324 mg/dL — ABNORMAL HIGH (ref 65–99)
Glucose-Capillary: 250 mg/dL — ABNORMAL HIGH (ref 65–99)

## 2016-04-08 LAB — BASIC METABOLIC PANEL
ANION GAP: 6 (ref 5–15)
BUN: 9 mg/dL (ref 6–20)
CHLORIDE: 100 mmol/L — AB (ref 101–111)
CO2: 28 mmol/L (ref 22–32)
Calcium: 8.6 mg/dL — ABNORMAL LOW (ref 8.9–10.3)
Creatinine, Ser: <0.3 mg/dL — ABNORMAL LOW (ref 0.61–1.24)
GLUCOSE: 247 mg/dL — AB (ref 65–99)
POTASSIUM: 3.3 mmol/L — AB (ref 3.5–5.1)
SODIUM: 134 mmol/L — AB (ref 135–145)

## 2016-04-08 LAB — MRSA PCR SCREENING: MRSA by PCR: NEGATIVE

## 2016-04-08 LAB — ACID FAST SMEAR (AFB)

## 2016-04-08 LAB — ACID FAST SMEAR (AFB, MYCOBACTERIA): Acid Fast Smear: NEGATIVE

## 2016-04-08 LAB — VANCOMYCIN, TROUGH: VANCOMYCIN TR: 4 ug/mL — AB (ref 15–20)

## 2016-04-08 LAB — HEMOGLOBIN A1C: HEMOGLOBIN A1C: 16.5 % — AB (ref 4.0–6.0)

## 2016-04-08 MED ORDER — GLUCERNA SHAKE PO LIQD
237.0000 mL | Freq: Three times a day (TID) | ORAL | Status: DC
  Administered 2016-04-08 – 2016-04-10 (×3): 237 mL via ORAL

## 2016-04-08 MED ORDER — DEXTROSE 5 % IV SOLN
1.0000 g | INTRAVENOUS | Status: DC
  Administered 2016-04-08 – 2016-04-09 (×2): 1 g via INTRAVENOUS
  Filled 2016-04-08 (×2): qty 10

## 2016-04-08 MED ORDER — DEXTROSE 5 % IV SOLN
500.0000 mg | INTRAVENOUS | Status: DC
  Administered 2016-04-08: 500 mg via INTRAVENOUS
  Filled 2016-04-08 (×2): qty 500

## 2016-04-08 MED ORDER — INSULIN ASPART 100 UNIT/ML ~~LOC~~ SOLN
0.0000 [IU] | Freq: Three times a day (TID) | SUBCUTANEOUS | Status: DC
  Administered 2016-04-08: 9 [IU] via SUBCUTANEOUS
  Administered 2016-04-08 – 2016-04-09 (×3): 11 [IU] via SUBCUTANEOUS
  Administered 2016-04-09: 15 [IU] via SUBCUTANEOUS
  Administered 2016-04-10 (×2): 8 [IU] via SUBCUTANEOUS
  Filled 2016-04-08: qty 8
  Filled 2016-04-08: qty 15
  Filled 2016-04-08: qty 11
  Filled 2016-04-08: qty 8
  Filled 2016-04-08 (×2): qty 11
  Filled 2016-04-08: qty 5

## 2016-04-08 MED ORDER — POTASSIUM CHLORIDE CRYS ER 20 MEQ PO TBCR
40.0000 meq | EXTENDED_RELEASE_TABLET | Freq: Once | ORAL | Status: AC
  Administered 2016-04-08: 40 meq via ORAL
  Filled 2016-04-08: qty 2

## 2016-04-08 MED ORDER — INSULIN ASPART PROT & ASPART (70-30 MIX) 100 UNIT/ML ~~LOC~~ SUSP
55.0000 [IU] | Freq: Two times a day (BID) | SUBCUTANEOUS | Status: DC
Start: 2016-04-08 — End: 2016-04-09
  Administered 2016-04-08 – 2016-04-09 (×2): 55 [IU] via SUBCUTANEOUS
  Filled 2016-04-08 (×2): qty 55

## 2016-04-08 MED ORDER — VANCOMYCIN HCL IN DEXTROSE 750-5 MG/150ML-% IV SOLN
750.0000 mg | Freq: Three times a day (TID) | INTRAVENOUS | Status: DC
  Administered 2016-04-08 – 2016-04-09 (×2): 750 mg via INTRAVENOUS
  Filled 2016-04-08 (×4): qty 150

## 2016-04-08 MED ORDER — INSULIN ASPART 100 UNIT/ML ~~LOC~~ SOLN
11.0000 [IU] | Freq: Once | SUBCUTANEOUS | Status: AC
  Administered 2016-04-08: 11 [IU] via SUBCUTANEOUS

## 2016-04-08 MED ORDER — POTASSIUM CHLORIDE CRYS ER 20 MEQ PO TBCR
40.0000 meq | EXTENDED_RELEASE_TABLET | Freq: Every day | ORAL | Status: AC
  Administered 2016-04-09 – 2016-04-10 (×2): 40 meq via ORAL
  Filled 2016-04-08 (×2): qty 2

## 2016-04-08 MED ORDER — INSULIN ASPART 100 UNIT/ML ~~LOC~~ SOLN: 0.0000 [IU] | Freq: Every day | SUBCUTANEOUS | Status: DC

## 2016-04-08 NOTE — Progress Notes (Signed)
[facility] [place] NAME: Gregory Moody    MR#:  [id number]  DATE OF BIRTH:  [birth year]  SUBJECTIVE:   No acute events overnight. AFB result pending.  Wrist pain improved  REVIEW OF SYSTEMS:    Review of Systems  Constitutional: Positive for weight loss. Negative for fever, chills and malaise/fatigue.  HENT: Negative for ear discharge, ear pain, hearing loss, nosebleeds and sore throat.   Eyes: Negative for blurred vision and pain.  Respiratory: Negative for cough, hemoptysis, shortness of breath and wheezing.   Cardiovascular: Negative for chest pain, palpitations and leg swelling.  Gastrointestinal: Negative for nausea, vomiting, abdominal pain, diarrhea and blood in stool.  Genitourinary: Negative for dysuria.  Musculoskeletal: Positive for falls. Negative for back pain.  Neurological: Negative for dizziness, tremors, speech change, focal weakness, seizures and headaches.  Endo/Heme/Allergies: Does not bruise/bleed easily.  Psychiatric/Behavioral: Negative for depression, suicidal ideas and hallucinations.    Tolerating Diet:yes      DRUG ALLERGIES:  No Known Allergies  VITALS:  Blood pressure 97/70, pulse 68, temperature 98 F (36.7 C), temperature source Oral, resp. rate 17, height 5\' 8"  (1.727 m), weight 50.621 kg (111 lb 9.6 oz), SpO2 100 %.  PHYSICAL EXAMINATION:   Physical Exam  Constitutional: He is oriented to person, place, and time and well-developed, well-nourished, and in no distress. No distress.  HENT:  Head: Normocephalic.  Eyes: No scleral icterus.  Neck: Normal range of motion. Neck supple. No JVD present. No tracheal deviation present.  Cardiovascular: Normal rate, regular rhythm and normal heart sounds.  Exam reveals no gallop and no friction rub.   No murmur heard. Pulmonary/Chest: Effort normal and breath sounds normal. No respiratory distress. He has no wheezes. He has no rales. He exhibits  no tenderness.  Abdominal: Soft. Bowel sounds are normal. He exhibits no distension and no mass. There is no tenderness. There is no rebound and no guarding.  Musculoskeletal: Normal range of motion. He exhibits no edema.  Neurological: He is alert and oriented to person, place, and time.  Skin: Skin is warm. No rash noted. No erythema.  Psychiatric: Affect and judgment normal.      LABORATORY PANEL:   CBC  Recent Labs Lab 04/07/16 0419  WBC 4.7  HGB 11.2*  HCT 30.4*  PLT 259   ------------------------------------------------------------------------------------------------------------------  Chemistries   Recent Labs Lab 04/08/16 0424  NA 134*  K 3.3*  CL 100*  CO2 28  GLUCOSE 247*  BUN 9  CREATININE <0.30*  CALCIUM 8.6*   ------------------------------------------------------------------------------------------------------------------  Cardiac Enzymes No results for input(s): TROPONINI in the last 168 hours. ------------------------------------------------------------------------------------------------------------------  RADIOLOGY:  Dg Chest 2 View  04/06/2016  CLINICAL DATA:  Right site chest wall pain. EXAM: CHEST  2 VIEW COMPARISON:  None. FINDINGS: Normal heart size. No pleural effusion or edema. Airspace consolidation is identified within the right upper lobe compatible with pneumonia. Left lung is clear. IMPRESSION: 1. Right upper lobe pneumonia. Followup PA and lateral chest X-ray is recommended in 3-4 weeks following trial of antibiotic therapy to ensure resolution and exclude underlying malignancy. Electronically Signed   By: Kerby Moors M.D.   On: 04/06/2016 16:38   Dg Wrist Complete Right  04/06/2016  CLINICAL DATA:  Fall.  Diffuse right wrist pain. EXAM: RIGHT WRIST - COMPLETE 3+ VIEW COMPARISON:  None. FINDINGS: There is no evidence of fracture or dislocation. There is no evidence of arthropathy or other focal bone abnormality. Soft tissues are  unremarkable. IMPRESSION: Negative. Electronically Signed   By: Kerby Moors M.D.   On: 04/06/2016 16:47   Ct Chest W Contrast  04/06/2016  CLINICAL DATA:  Right upper lobe opacity on chest radiograph. Right-sided chest pain. EXAM: CT CHEST WITH CONTRAST TECHNIQUE: Multidetector CT imaging of the chest was performed during intravenous contrast administration. CONTRAST:  75 mL Isovue 370 COMPARISON:  Chest radiograph on 04/06/2016 FINDINGS: Mediastinum/Lymph Nodes: No masses, pathologically enlarged lymph nodes, or other significant abnormality. Lungs/Pleura: Ill-defined area of consolidation is seen in the peripheral right upper lobe which shows air bronchograms and tiny cystic lucencies which may represent early cavitation. There are also adjacent patchy areas of airspace disease and tiny sub-cm nodular densities in the right upper lobe and superior segment of the right lower lobe. These findings are suspicious for pneumonia. No evidence pleural effusion. Central tracheobronchial airways are patent. Upper abdomen: No acute findings. Musculoskeletal: No chest wall mass or suspicious bone lesions identified. IMPRESSION: Focal consolidation in peripheral right upper lobe with central air bronchograms and possible early cavitary changes. These findings are most consistent with pneumonia. Followup PA and lateral chest X-ray is recommended in 3-4 weeks following trial of antibiotic therapy to ensure resolution and exclude underlying malignancy. No evidence of lymphadenopathy or pleural effusion. Electronically Signed   By: Earle Gell M.D.   On: 04/06/2016 17:51     ASSESSMENT AND PLAN:   40 year old male with a history of diabetes who presented after a fall and found to have cavitary lesion on x-ray.   1. Uncontrolled diabetes: Appreciate diabetes coordinator consult. NPH increase. Sliding scale increased. Check hemoglobin A1c.  2. Cavitary lesion concerning for tuberculosis: Continue Levaquin and  vancomycin. Continue isolation precautions to rule out tuberculosis. Pending quanteferon TB test. Continue AFB 3. Pulmonary consult appreciated. May need bronchoscopy if unable to collect adequate sputum sample.  3. Fall with wrist pain: X-rays negative for acute fracture. Continue supportive care. 4. Hypokalemia: Replete.  5. Weight loss: TSH normal, HIV negative. Likely due to problem #2.   Management plans discussed with the patient and he is in agreement.  CODE STATUS: FULL  TOTAL TIME TAKING CARE OF THIS PATIENT: 24 minutes.     POSSIBLE D/C 3-5 days, DEPENDING ON CLINICAL CONDITION.   Gregory Moody M.D on 04/08/2016 at 11:52 AM  Between 7am to 6pm - Pager - [phone] After 6pm go to [email or web address] - password [email or web address] [facility]  Office  [phone]  CC: Primary care physician; No primary care provider on file.  Note: This dictation was prepared with Dragon dictation along with smaller phrase technology. Any transcriptional errors that result from this process are unintentional.

## 2016-04-08 NOTE — Progress Notes (Signed)
MD notified of CBG 362. No additional insulin coverage ordered at this time. Will continue to assess.

## 2016-04-08 NOTE — Progress Notes (Signed)
[facility]     Assessment and Plan:  40 year old male with right upper lobe cavitary lesion.  A: Right upper lobe pneumonia -Risk factors for TB include [place], history of being incarcerated. -Patient also has findings on CT scan are concerning for mycobacterial and fungal infection of the differential. Cachexia Risk factors for TB Diabetes  P: -Continue with AFB collection and regular sputum culture.  -If unable to collect adequate sputum sample will then consider bronchoscopy. -We'll check MRSA screen-if negative, can discontinue vancomycin. -We'll change Levaquin to ceftriaxone/azithromycin, as fluoroquinolones can suppress TB growth/cultures. -Once TB has been ruled out, would send for a CT-guided needle biopsy for diagnosis of the lung mass.     Date: 04/08/2016  MRN# [id number] Ricko Macdonell 1976-10-10   Barrie Schwitzer is a 40 y.o. old male seen in follow up for chief complaint of  Chief Complaint  Patient presents with  . Hyperglycemia     HPI:     Medication:   @ENCMED @   Allergies:  Review of patient's allergies indicates no known allergies.  Review of Systems: Gen:  Denies  fever, sweats. HEENT: Denies blurred vision. Cvc:  No dizziness, chest pain or heaviness Resp:   Denies cough or sputum porduction. Gi: Denies swallowing difficulty, stomach pain. constipation, bowel incontinence Gu:  Denies bladder incontinence, burning urine Ext:   No Joint pain, stiffness. Skin: No skin rash, easy bruising. Endoc:  No polyuria, polydipsia. Psych: No depression, insomnia. Other:  All other systems were reviewed and found to be negative other than what is mentioned in the HPI.   Physical Examination:   VS: BP 97/70 mmHg  Pulse 68  Temp(Src) 98 F (36.7 C) (Oral)  Resp 17  Ht 5\' 8"  (1.727 m)  Wt 111 lb 9.6 oz (50.621 kg)  BMI 16.97 kg/m2  SpO2 100%  General Appearance: No distress  Neuro:without  focal findings,  speech normal,  HEENT: PERRLA, EOM intact. Pulmonary: normal breath sounds, No wheezing.   CardiovascularNormal S1,S2.  No m/r/g.   Abdomen: Benign, Soft, non-tender. Renal:  No costovertebral tenderness  GU:  Not performed at this time. Endoc: No evident thyromegaly, no signs of acromegaly. Skin:   warm, no rash. Extremities: normal, no cyanosis, clubbing.   LABORATORY PANEL:   CBC  Recent Labs Lab 04/07/16 0419  WBC 4.7  HGB 11.2*  HCT 30.4*  PLT 259   ------------------------------------------------------------------------------------------------------------------  Chemistries   Recent Labs Lab 04/08/16 0424  NA 134*  K 3.3*  CL 100*  CO2 28  GLUCOSE 247*  BUN 9  CREATININE <0.30*  CALCIUM 8.6*   ------------------------------------------------------------------------------------------------------------------  Cardiac Enzymes No results for input(s): TROPONINI in the last 168 hours. ------------------------------------------------------------  RADIOLOGY:   No results found for this or any previous visit. Results for orders placed during the hospital encounter of 04/06/16  DG Chest 2 View   Narrative CLINICAL DATA:  Right site chest wall pain.  EXAM: CHEST  2 VIEW  COMPARISON:  None.  FINDINGS: Normal heart size. No pleural effusion or edema. Airspace consolidation is identified within the right upper lobe compatible with pneumonia. Left lung is clear.  IMPRESSION: 1. Right upper lobe pneumonia. Followup PA and lateral chest X-ray is recommended in 3-4 weeks following trial of antibiotic therapy to ensure resolution and exclude underlying malignancy.   Electronically Signed   By: Kerby Moors M.D.   On: 04/06/2016 16:38    ------------------------------------------------------------------------------------------------------------------  Thank  you for allowing [facility], Critical Care to assist in the care  of your patient. Our recommendations are noted above.  Please contact us if we can be of further service.   Marda Stalker, MD.  [facility] Office Number: [phone]  Patricia Pesa, M.D.  Vilinda Boehringer, M.D.  Merton Border, M.D  04/08/2016

## 2016-04-08 NOTE — Progress Notes (Signed)
ANTIBIOTIC CONSULT NOTE - INITIAL  Pharmacy Consult for Vancomycin  Indication: pneumonia  No Known Allergies  Patient Measurements: Height: 5\' 8"  (172.7 cm) Weight: 111 lb 9.6 oz (50.621 kg) IBW/kg (Calculated) : 68.4 Adjusted Body Weight: 61.28  Vital Signs: Temp: 98 F (36.7 C) (06/29 1128) BP: 97/70 mmHg (06/29 1129) Pulse Rate: 68 (06/29 1129) Intake/Output from previous day: 06/28 0701 - 06/29 0700 In: 2094 [P.O.:1020; I.V.:924; IV Piggyback:150] Out: 2200 [Urine:2200] Intake/Output from this shift: Total I/O In: 540 [P.O.:240; I.V.:300] Out: -   Labs:  Recent Labs  04/06/16 1557 04/07/16 0419 04/08/16 0424  WBC 4.7 4.7  --   HGB 11.8* 11.2*  --   PLT 272 259  --   CREATININE 0.74 0.37* <0.30*   CrCl cannot be calculated (Patient has no serum creatinine result on file.).  Recent Labs  04/08/16 1315  [place] 4*     Microbiology: Recent Results (from the past 720 hour(s))  Culture, blood (routine x 2)     Status: None (Preliminary result)   Collection Time: 04/06/16  6:40 PM  Result Value Ref Range Status   Specimen Description BLOOD LEFT ARM  Final   Special Requests   Final    BOTTLES DRAWN AEROBIC AND ANAEROBIC  AERO 14CC ANA [place]   Culture NO GROWTH 2 DAYS  Final   Report Status PENDING  Incomplete  Culture, blood (routine x 2)     Status: None (Preliminary result)   Collection Time: 04/06/16  6:48 PM  Result Value Ref Range Status   Specimen Description BLOOD RIGHT ASSIST CONTROL  Final   Special Requests   Final    BOTTLES DRAWN AEROBIC AND ANAEROBIC  AERO 20CC ANA 25CC   Culture NO GROWTH 2 DAYS  Final   Report Status PENDING  Incomplete  Acid Fast Smear (AFB)     Status: None   Collection Time: 04/07/16  6:00 PM  Result Value Ref Range Status   AFB Specimen Processing Concentration  Final   Acid Fast Smear Negative  Final    Comment: (NOTE) Performed At: [facility] [address] [place], [place] [id number] Lindon Romp MD [id number]    Source (AFB) SPUTUM  Final    Comment: Performed at [place] History: Past Medical History  Diagnosis Date  . Diabetes mellitus without complication ([place])     Medications:  Prescriptions prior to admission  Medication Sig Dispense Refill Last Dose  . insulin NPH-regular Human (NOVOLIN 70/30) (70-30) 100 UNIT/ML injection Inject 50 Units into the skin 2 (two) times daily with a meal.    04/06/2016 at Unknown time  . metFORMIN (GLUCOPHAGE) 1000 MG tablet Take 1,000 mg by mouth 2 (two) times daily with a meal.   04/06/2016 at Unknown time   Assessment: Currently ordered Vancomycin 750mg  IV q12h. Vancomycin trough resulted at 4 mcg/ml.  Goal of Therapy:  Vancomycin trough level 15-20 mcg/ml  Plan:  Will increase to Vancomycin 750mg  IV q8h. Will recheck a trough level prior to 5th dose.  Paulina Fusi, PharmD, BCPS 04/08/2016 5:02 PM

## 2016-04-08 NOTE — Progress Notes (Signed)
FSBS critically high at 402 and re check was 375. Provided scheduled 9 units and gave additional 11 units per Md order

## 2016-04-08 NOTE — Progress Notes (Signed)
Inpatient Diabetes Program Recommendations  AACE/ADA: New Consensus Statement on Inpatient Glycemic Control (2015)  Target Ranges:  Prepandial:   less than 140 mg/dL      Peak postprandial:   less than 180 mg/dL (1-2 hours)      Critically ill patients:  140 - 180 mg/dL   Lab Results  Component Value Date   GLUCAP 303* 04/08/2016    Review of Glycemic Control  Results for Gregory Moody, Gregory Moody (MRN [id number]) as of 04/08/2016 07:41  Ref. Range 04/07/2016 11:28 04/07/2016 16:42 04/07/2016 20:46 04/08/2016 00:42 04/08/2016 07:38  Glucose-Capillary Latest Ref Range: 65-99 mg/dL 230 (H) 318 (H) 345 (H) 362 (H) 303 (H)    Diabetes history: DM Outpatient Diabetes medications: Novolin 70/30 50 units BID, Metformin 1000 mg BID Current orders for Inpatient glycemic control: 70/30 50 units BID, Novolog 0-9 units TID with meals, Novolog 0-5 units QHS  Inpatient Diabetes Program Recommendations: Insulin - Basal: Please consider increasing 70/30 to 55 units BID. HgbA1C: Please consider adding an A1C to blood in lab to evaluate glycemic control over the past 2-3 months.  Gentry Fitz, RN, BA, MHA, CDE Diabetes Coordinator Inpatient Diabetes Program  [phone] (Team Pager) [phone] ([place]) 04/08/2016 7:44 AM

## 2016-04-08 NOTE — Progress Notes (Signed)
Initial Nutrition Assessment  DOCUMENTATION CODES:   Severe malnutrition in context of acute illness/injury  INTERVENTION:  -Monitor intake and recommend liberalizing diet to carb modified  -Recommend glucerna TID between meals for added nutrition    NUTRITION DIAGNOSIS:   Malnutrition related to acute illness as evidenced by percent weight loss, severe depletion of body fat, severe depletion of muscle mass.    GOAL:   Patient will meet greater than or equal to 90% of their needs    MONITOR:   PO intake, Supplement acceptance, Weight trends  REASON FOR ASSESSMENT:   Malnutrition Screening Tool    ASSESSMENT:      Pt admitted with chest pain after having fall at work, Cavitary lesion, RUL pneumonia, cachexia. Possible TB. Pt also with uncontrolled DM  Past Medical History  Diagnosis Date  . Diabetes mellitus without complication ([place])    Pt reports good appetite. Ate 100% of lunch today, tray observed.  RN, Merrily Pew confirms good appetite.    Medications reviewed aspart, NS at 110ml/hr Labs reviewed: FSBS 375, 402  Nutrition-Focused physical exam completed. Findings are severe fat depletion, moderate/severe muscle depletion, and no edema.     Diet Order:  Diet Carb Modified Fluid consistency:: Thin; Room service appropriate?: Yes  Skin:  Reviewed, no issues  Last BM:  6/26  Height:   Ht Readings from Last 1 Encounters:  04/06/16 5\' 8"  (1.727 m)    Weight: Noted 50 pound wt loss in the last month 31% wt loss in the last month  Wt Readings from Last 1 Encounters:  04/06/16 111 lb 9.6 oz (50.621 kg)    Ideal Body Weight:     BMI:  Body mass index is 16.97 kg/(m^2).  Estimated Nutritional Needs:   Kcal:  [id number] kcals/d  Protein:  75-100 g/d  Fluid:  >/= 1770ml/d  EDUCATION NEEDS:   Education needs addressed  Zharia Conrow B. Zenia Resides, [place], [place] (pager) Weekend/On-Call pager [phone])

## 2016-04-09 DIAGNOSIS — E43 Unspecified severe protein-calorie malnutrition: Secondary | ICD-10-CM | POA: Insufficient documentation

## 2016-04-09 DIAGNOSIS — E46 Unspecified protein-calorie malnutrition: Secondary | ICD-10-CM | POA: Insufficient documentation

## 2016-04-09 LAB — URINE CULTURE: Special Requests: NORMAL

## 2016-04-09 LAB — GLUCOSE, CAPILLARY
GLUCOSE-CAPILLARY: 340 mg/dL — AB (ref 65–99)
GLUCOSE-CAPILLARY: 363 mg/dL — AB (ref 65–99)
GLUCOSE-CAPILLARY: 324 mg/dL — AB (ref 65–99)
Glucose-Capillary: 449 mg/dL — ABNORMAL HIGH (ref 65–99)
Glucose-Capillary: 184 mg/dL — ABNORMAL HIGH (ref 65–99)

## 2016-04-09 LAB — BASIC METABOLIC PANEL
ANION GAP: 5 (ref 5–15)
BUN: 8 mg/dL (ref 6–20)
CO2: 28 mmol/L (ref 22–32)
Calcium: 8.7 mg/dL — ABNORMAL LOW (ref 8.9–10.3)
Chloride: 101 mmol/L (ref 101–111)
Creatinine, Ser: <0.3 mg/dL — ABNORMAL LOW (ref 0.61–1.24)
Glucose, Bld: 179 mg/dL — ABNORMAL HIGH (ref 65–99)
POTASSIUM: 3.7 mmol/L (ref 3.5–5.1)
SODIUM: 134 mmol/L — AB (ref 135–145)

## 2016-04-09 MED ORDER — INSULIN ASPART 100 UNIT/ML ~~LOC~~ SOLN
5.0000 [IU] | Freq: Once | SUBCUTANEOUS | Status: AC
  Administered 2016-04-09: 5 [IU] via SUBCUTANEOUS

## 2016-04-09 MED ORDER — AZITHROMYCIN 250 MG PO TABS
250.0000 mg | ORAL_TABLET | Freq: Every day | ORAL | Status: DC
  Administered 2016-04-09: 250 mg via ORAL
  Filled 2016-04-09: qty 1

## 2016-04-09 MED ORDER — INSULIN ASPART PROT & ASPART (70-30 MIX) 100 UNIT/ML ~~LOC~~ SUSP
70.0000 [IU] | Freq: Two times a day (BID) | SUBCUTANEOUS | Status: DC
  Administered 2016-04-09 – 2016-04-10 (×2): 70 [IU] via SUBCUTANEOUS
  Filled 2016-04-09 (×2): qty 70

## 2016-04-09 MED ORDER — SODIUM CHLORIDE 3 % IN NEBU
4.0000 mL | INHALATION_SOLUTION | RESPIRATORY_TRACT | Status: DC | PRN
  Filled 2016-04-09: qty 4

## 2016-04-09 NOTE — Progress Notes (Signed)
No new complaints. No distress  Filed Vitals:   04/08/16 1129 04/08/16 2150 04/09/16 0549 04/09/16 1343  BP: 97/70 99/65 99/67  94/59  Pulse: 68 75 73 86  Temp:  98.3 F (36.8 C) 98 F (36.7 C) 98 F (36.7 C)  TempSrc:      Resp:  16 18 16   Height:      Weight:      SpO2: 100% 98% 99% 98%   NAD HEENT WNL No wheezes RRR s M NABS No LE edema  BMP Latest Ref Rng 04/09/2016 04/08/2016 04/07/2016  Glucose 65 - 99 mg/dL 179(H) 247(H) 252(H)  BUN 6 - 20 mg/dL 8 9 11   Creatinine 0.61 - 1.24 mg/dL <0.30(L) <0.30(L) 0.37(L)  Sodium 135 - 145 mmol/L 134(L) 134(L) 133(L)  Potassium 3.5 - 5.1 mmol/L 3.7 3.3(L) 3.4(L)  Chloride 101 - 111 mmol/L 101 100(L) 100(L)  CO2 22 - 32 mmol/L 28 28 25   Calcium 8.9 - 10.3 mg/dL 8.7(L) 8.6(L) 8.6(L)    CBC Latest Ref Rng 04/07/2016 04/06/2016  WBC 3.8 - 10.6 K/uL 4.7 4.7  Hemoglobin 13.0 - 18.0 g/dL 11.2(L) 11.8(L)  Hematocrit 40.0 - 52.0 % 30.4(L) 32.9(L)  Platelets 150 - 440 K/uL 259 272    No new CXR  A: Right upper lobe cavitary pneumonia with multiple RFs for TB - 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 If AFB smears are negative, he should undergo bronchoscopy first of next week DC vancomycin F/U CXR ordered for AM 07/02 Mgmt of DM per primary team  Merton Border, MD PCCM service Mobile [phone] Pager [phone] 04/09/2016

## 2016-04-09 NOTE — Progress Notes (Signed)
ANTIBIOTIC CONSULT NOTE - INITIAL  Pharmacy Consult for Vancomycin  Indication: pneumonia  No Known Allergies  Patient Measurements: Height: 5\' 8"  (172.7 cm) Weight: 111 lb 9.6 oz (50.621 kg) IBW/kg (Calculated) : 68.4 Adjusted Body Weight: 61.28  Vital Signs: Temp: 98 F (36.7 C) (06/30 0549) BP: 99/67 mmHg (06/30 0549) Pulse Rate: 73 (06/30 0549) Intake/Output from previous day: 06/29 0701 - 06/30 0700 In: 2344.3 [P.O.:600; I.V.:1294.3; IV Piggyback:450] Out: 1000 [Urine:1000] Intake/Output from this shift: Total I/O In: 733 [I.V.:733] Out: 1900 [Urine:1400; Stool:500]  Labs:  Recent Labs  04/06/16 1557 04/07/16 0419 04/08/16 0424 04/09/16 0411  WBC 4.7 4.7  --   --   HGB 11.8* 11.2*  --   --   PLT 272 259  --   --   CREATININE 0.74 0.37* <0.30* <0.30*   CrCl cannot be calculated (Patient has no serum creatinine result on file.).  Recent Labs  04/08/16 1315  [place] 4*     Microbiology: Recent Results (from the past 720 hour(s))  Culture, blood (routine x 2)     Status: None (Preliminary result)   Collection Time: 04/06/16  6:40 PM  Result Value Ref Range Status   Specimen Description BLOOD LEFT ARM  Final   Special Requests   Final    BOTTLES DRAWN AEROBIC AND ANAEROBIC  AERO 14CC ANA [place]   Culture NO GROWTH 3 DAYS  Final   Report Status PENDING  Incomplete  Culture, blood (routine x 2)     Status: None (Preliminary result)   Collection Time: 04/06/16  6:48 PM  Result Value Ref Range Status   Specimen Description BLOOD RIGHT ASSIST CONTROL  Final   Special Requests   Final    BOTTLES DRAWN AEROBIC AND ANAEROBIC  AERO 20CC ANA 25CC   Culture NO GROWTH 3 DAYS  Final   Report Status PENDING  Incomplete  Acid Fast Smear (AFB)     Status: None   Collection Time: 04/07/16  6:00 PM  Result Value Ref Range Status   AFB Specimen Processing Concentration  Final   Acid Fast Smear Negative  Final    Comment: (NOTE) Performed At: [facility] [address] [place], [place] [id number] Lindon Romp MD [id number]    Source (AFB) SPUTUM  Final    Comment: Performed at [facility]  Urine culture     Status: Abnormal   Collection Time: 04/08/16  1:22 AM  Result Value Ref Range Status   Specimen Description URINE, CLEAN CATCH  Final   Special Requests Normal  Final   Culture (A)  Final    8,000 COLONIES/mL INSIGNIFICANT GROWTH Performed at [facility]    Report Status 04/09/2016 FINAL  Final  MRSA PCR Screening     Status: None   Collection Time: 04/08/16  6:20 PM  Result Value Ref Range Status   MRSA by PCR NEGATIVE NEGATIVE Final    Comment:        The GeneXpert MRSA Assay (FDA approved for NASAL specimens only), is one component of a comprehensive MRSA colonization surveillance program. It is not intended to diagnose MRSA infection nor to guide or monitor treatment for MRSA infections.     Medical History: Past Medical History  Diagnosis Date  . Diabetes mellitus without complication ([place])     Medications:  Prescriptions prior to admission  Medication Sig Dispense Refill Last Dose  . insulin NPH-regular Human (NOVOLIN 70/30) (70-30) 100 UNIT/ML injection Inject 50 Units into  the skin 2 (two) times daily with a meal.    04/06/2016 at Unknown time  . metFORMIN (GLUCOPHAGE) 1000 MG tablet Take 1,000 mg by mouth 2 (two) times daily with a meal.   04/06/2016 at Unknown time   Assessment: Currently ordered Vancomycin 750mg  IV q12h. Vancomycin trough resulted at 4 mcg/ml. Dose was increased.  MRSA PCR: negative   Goal of Therapy:  Vancomycin trough level 15-20 mcg/ml  Plan:  Will continue  Vancomycin 750mg  IV q8h. Will recheck a trough level prior to 5th dose. Recommend discontinuation of vancomycin based on MRSA PCR negative. Dr. Benjie Karvonen does not feel comfortable discontinuing abx at this time based on patients cavitary lesion.   Nancy Fetter, PharmD Clinical  Pharmacist 04/09/2016 12:22 PM

## 2016-04-09 NOTE — Progress Notes (Signed)
Patient has not wanted to be compliant with carb modified diet. Patient continues to call out for additional snacks. As reported from nurse tech, pt was consuming large drink from [employer] with food present from pt visitors.

## 2016-04-09 NOTE — Progress Notes (Signed)
[facility] [place] NAME: Gregory Moody    MR#:  [id number]  DATE OF BIRTH:  [birth year]  SUBJECTIVE:   Patient did not give a sputum sample yesterday.  REVIEW OF SYSTEMS:    Review of Systems  Constitutional: Positive for weight loss. Negative for fever, chills and malaise/fatigue.  HENT: Negative for ear discharge, ear pain, hearing loss, nosebleeds and sore throat.   Eyes: Negative for blurred vision and pain.  Respiratory: Negative for cough, hemoptysis, shortness of breath and wheezing.   Cardiovascular: Negative for chest pain, palpitations and leg swelling.  Gastrointestinal: Negative for nausea, vomiting, abdominal pain, diarrhea and blood in stool.  Genitourinary: Negative for dysuria.  Musculoskeletal: Negative for back pain.  Neurological: Negative for dizziness, tremors, speech change, focal weakness, seizures and headaches.  Endo/Heme/Allergies: Does not bruise/bleed easily.  Psychiatric/Behavioral: Negative for depression, suicidal ideas and hallucinations.    Tolerating Diet:yes      DRUG ALLERGIES:  No Known Allergies  VITALS:  Blood pressure 99/67, pulse 73, temperature 98 F (36.7 C), temperature source Oral, resp. rate 18, height 5\' 8"  (1.727 m), weight 50.621 kg (111 lb 9.6 oz), SpO2 99 %.  PHYSICAL EXAMINATION:   Physical Exam  Constitutional: He is oriented to person, place, and time and well-developed, well-nourished, and in no distress. No distress.  HENT:  Head: Normocephalic.  Eyes: No scleral icterus.  Neck: Normal range of motion. Neck supple. No JVD present. No tracheal deviation present.  Cardiovascular: Normal rate, regular rhythm and normal heart sounds.  Exam reveals no gallop and no friction rub.   No murmur heard. Pulmonary/Chest: Effort normal and breath sounds normal. No respiratory distress. He has no wheezes. He has no rales. He exhibits no tenderness.  Abdominal: Soft. Bowel  sounds are normal. He exhibits no distension and no mass. There is no tenderness. There is no rebound and no guarding.  Musculoskeletal: Normal range of motion. He exhibits no edema.  Neurological: He is alert and oriented to person, place, and time.  Skin: Skin is warm. No rash noted. No erythema.  Psychiatric: Affect and judgment normal.      LABORATORY PANEL:   CBC  Recent Labs Lab 04/07/16 0419  WBC 4.7  HGB 11.2*  HCT 30.4*  PLT 259   ------------------------------------------------------------------------------------------------------------------  Chemistries   Recent Labs Lab 04/09/16 0411  NA 134*  K 3.7  CL 101  CO2 28  GLUCOSE 179*  BUN 8  CREATININE <0.30*  CALCIUM 8.7*   ------------------------------------------------------------------------------------------------------------------  Cardiac Enzymes No results for input(s): TROPONINI in the last 168 hours. ------------------------------------------------------------------------------------------------------------------  RADIOLOGY:  No results found.   ASSESSMENT AND PLAN:   40 year old male with a history of diabetes who presented after a fall and found to have cavitary lesion on x-ray.   1. Uncontrolled diabetes:Hemoglobin A1c is greater than 16. Increased dose of NPH per recommendations of diabetes coordinator. Continue ADA diet and send scale insulin.  2. Cavitary lesion concerning for tuberculosis/pneumonia: Continue Rocephin and azithromycin and vancomycin. Continue isolation precautions to rule out tuberculosis. Pending quanteferon TB test. Continue AFB 3. First AFB is negative Pulmonary consult appreciated. May need bronchoscopy if unable to collect adequate sputum sample. Asked nurse to call RT for induced sputum for AFB culture. MRSA PCR is negative, pulmonary May discontinue vancomycin.  3. Fall with wrist pain: X-rays negative for acute fracture. Continue supportive care. 4.  Hypokalemia: Replete when necessary.  5. Weight loss: TSH normal, HIV negative. Likely  due to problem #2.   Management plans discussed with the patient and he is in agreement.  CODE STATUS: FULL  TOTAL TIME TAKING CARE OF THIS PATIENT: 24 minutes.     POSSIBLE D/C 3-5 days, DEPENDING ON CLINICAL CONDITION.   Kathy Wahid M.D on 04/09/2016 at 11:38 AM  Between 7am to 6pm - Pager - [phone] After 6pm go to [email or web address] - password [email or web address] [facility]  Office  [phone]  CC: Primary care physician; No primary care provider on file.  Note: This dictation was prepared with Dragon dictation along with smaller phrase technology. Any transcriptional errors that result from this process are unintentional.

## 2016-04-09 NOTE — Care Management (Addendum)
Patient admitted ordinally for fall under workmans comp.  While admitted. Patient being worked up for Cavitary lesion concerning for tuberculosis, and uncontrolled DM. Patient is [place] speaking.  States that he is employed, however uninsured. Patient states that he is not a legal citizen of the [place]. Patient obtains his medications from [facility], and denies any financial issues.   Patient will need medication assistance at time of discharge.  I have spoken with medication management.  Dependent upon the medication they will be able to provide assistance at discharge. Spanish version of application for [facility] and Medication Management Provided.

## 2016-04-09 NOTE — Progress Notes (Signed)
Notified MD of patient blood glucose of 449. New one time order for 20 units Novolog sub-q obtained.  Called respiratory for assistance with obtaining patient sputum, acknowledged.

## 2016-04-09 NOTE — Progress Notes (Signed)
Inpatient Diabetes Program Recommendations  AACE/ADA: New Consensus Statement on Inpatient Glycemic Control (2015)  Target Ranges:  Prepandial:   less than 140 mg/dL      Peak postprandial:   less than 180 mg/dL (1-2 hours)      Critically ill patients:  140 - 180 mg/dL   Lab Results  Component Value Date   GLUCAP 340* 04/09/2016   HGBA1C 16.5* 04/08/2016    Review of Glycemic Control  Results for VANNAK, LOJEK (MRN [id number]) as of 04/09/2016 09:21  Ref. Range 04/08/2016 11:25 04/08/2016 11:44 04/08/2016 16:47 04/08/2016 21:48 04/09/2016 07:28  Glucose-Capillary Latest Ref Range: 65-99 mg/dL 402 (H) 375 (H) 324 (H) 250 (H) 340 (H)    Diabetes history: DM Outpatient Diabetes medications: Novolin 70/30 50 units BID, Metformin 1000 mg BID Current orders for Inpatient glycemic control: 70/30 55 units BID, Novolog 0-15 units TID with meals, Novolog 0-5 units QHS  Inpatient Diabetes Program Recommendations: Insulin - Basal: Please consider increasing 70/30 to 70 units BID.  Gentry Fitz, RN, BA, MHA, CDE Diabetes Coordinator Inpatient Diabetes Program  [phone] (Team Pager) [phone] ([place]) 04/09/2016 9:23 AM

## 2016-04-09 NOTE — Progress Notes (Signed)
Notified MD and lab of new QT-TB gold assay lab test for patient, received new order to await results that are pending from previous lab drawn on 04/06/16. Results still pending.

## 2016-04-10 LAB — BASIC METABOLIC PANEL
Anion gap: 5 (ref 5–15)
BUN: 13 mg/dL (ref 6–20)
CALCIUM: 8.6 mg/dL — AB (ref 8.9–10.3)
CO2: 29 mmol/L (ref 22–32)
CREATININE: 0.41 mg/dL — AB (ref 0.61–1.24)
Chloride: 99 mmol/L — ABNORMAL LOW (ref 101–111)
GLUCOSE: 208 mg/dL — AB (ref 65–99)
Potassium: 4 mmol/L (ref 3.5–5.1)
Sodium: 133 mmol/L — ABNORMAL LOW (ref 135–145)

## 2016-04-10 LAB — GLUCOSE, CAPILLARY
Glucose-Capillary: 272 mg/dL — ABNORMAL HIGH (ref 65–99)
Glucose-Capillary: 285 mg/dL — ABNORMAL HIGH (ref 65–99)

## 2016-04-10 LAB — HISTOPLASMA ANTIGEN, URINE: Histoplasma Antigen, urine: 0.03 ng/mL (ref 0.00–0.49)

## 2016-04-10 MED ORDER — LEVOFLOXACIN 500 MG PO TABS
500.0000 mg | ORAL_TABLET | ORAL | Status: DC
  Administered 2016-04-10: 500 mg via ORAL
  Filled 2016-04-10: qty 1

## 2016-04-10 MED ORDER — INSULIN NPH ISOPHANE & REGULAR (70-30) 100 UNIT/ML ~~LOC~~ SUSP: 70.0000 [IU] | Freq: Two times a day (BID) | SUBCUTANEOUS | Status: DC

## 2016-04-10 MED ORDER — METFORMIN HCL 1000 MG PO TABS: 1000.0000 mg | ORAL_TABLET | Freq: Two times a day (BID) | ORAL | Status: DC

## 2016-04-10 MED ORDER — LEVOFLOXACIN 500 MG PO TABS: 500.0000 mg | ORAL_TABLET | ORAL | Status: DC

## 2016-04-10 NOTE — Progress Notes (Signed)
Case was d/w Dr. Darvin Neighbours, he had discussed the pt with ID on call who reviewed the case and cleared the patient to be DC home after 1 negative AFB smear (cultures are pending).   In the meantime will  arrange for follow up OP with [place] pulmonary in regards to lung mass.   -Marda Stalker, M.D.  04/10/2016

## 2016-04-10 NOTE — Discharge Instructions (Addendum)
Diabetic low carbohydrate diet  Regular activity  It is very important to follow-up with the lung doctor, Dr. Ashby Dawes  Follow all MD discharge instructions. Take all medications as prescribed. Keep all follow up appointments. If your symptoms return, call your doctor. If you experience any new symptoms that are of concern to you or that are bothersome to you, call your doctor. For all questions and/or concerns, call your doctor.   If you have a medical emergency, call 911

## 2016-04-10 NOTE — Progress Notes (Signed)
Pharmacy Antibiotic Note  Gregory Moody is a 40 y.o. male admitted on 04/06/2016 with cavitary lesion of lung/ pneumonia.  Pharmacy has been consulted for levofloxacin dosing.  Plan: Will start the patient on levofloxacin 500mg  PO daily.  Height: 5\' 8"  (172.7 cm) Weight: 111 lb 9.6 oz (50.621 kg) IBW/kg (Calculated) : 68.4  Temp (24hrs), Avg:98.1 F (36.7 C), Min:98 F (36.7 C), Max:98.2 F (36.8 C)   Recent Labs Lab 04/06/16 1557 04/07/16 0419 04/08/16 0424 04/08/16 1315 04/09/16 0411 04/10/16 0608  WBC 4.7 4.7  --   --   --   --   CREATININE 0.74 0.37* <0.30*  --  <0.30* 0.41*  VANCOTROUGH  --   --   --  4*  --   --     Estimated Creatinine Clearance: 87.8 mL/min (by C-G formula based on Cr of 0.41).    No Known Allergies  Antimicrobials this admission: 04/06/16 Ceftriaxone>> 04/10/16 04/06/16 Azithromycin >> 04/10/16 04/10/16 Levofloxacin >>     Microbiology results: 04/06/16 BCx: NG X4 days  04/07/16 Sputum: pending 04/08/16 MRSA PCR: negative   Thank you for allowing pharmacy to be a part of this patient's care.  Nancy Fetter, PharmD Clinical Pharmacist 04/10/2016 8:04 AM

## 2016-04-10 NOTE — Progress Notes (Signed)
[place], [place].   04/10/2016  Patient: Gregory Moody   Date of Birth:  1975-11-08  Date of admission:  04/06/2016  Date of Discharge  04/10/2016    To Whom it May Concern:   Gregory Moody  may return to work on 04/16/2016.  If you have any questions or concerns, please don't hesitate to call.  Sincerely,   Hillary Bow R M.D Office : [phone]   .

## 2016-04-10 NOTE — Care Management Note (Signed)
Case Management Note  Patient Details  Name: Gregory Moody MRN: [id number] Date of Birth: 17-Oct-1975  Subjective/Objective:      This Probation officer attempted to enter Mr Gregory Moody into the [facility] program for medication assistance but the program rejected the request because he is on [employer]. [name] reports that he goes to the [facility] and will take his prescriptions there on Monday to get them filled if he cannot afford them.               Action/Plan:   Expected Discharge Date:                  Expected Discharge Plan:     In-House Referral:     Discharge planning Services     Post Acute Care Choice:    Choice offered to:     DME Arranged:    DME Agency:     HH Arranged:    HH Agency:     Status of Service:     If discussed at [employer] of Stay Meetings, dates discussed:    Additional Comments:  Spero Gunnels A, RN 04/10/2016, 4:06 PM

## 2016-04-10 NOTE — Progress Notes (Signed)
Pt d/c home; d/c instructions reviewed w/ pt via interpreter; pt understanding was verbalized; IV removed catheter in tact, gauze dressing applied; all pt questions answered via interpreter; pt left unit via wheelchair accompanied by staff

## 2016-04-11 LAB — QUANTIFERON TB GOLD ASSAY (BLOOD)

## 2016-04-11 LAB — QUANTIFERON IN TUBE
QFT TB AG MINUS NIL VALUE: 9.14 [IU]/mL
QUANTIFERON MITOGEN VALUE: 1.01 IU/mL
QUANTIFERON NIL VALUE: 0.09 [IU]/mL
QUANTIFERON TB AG VALUE: 9.23 [IU]/mL
QUANTIFERON TB GOLD: POSITIVE — AB

## 2016-04-11 LAB — CULTURE, BLOOD (ROUTINE X 2)
Culture: NO GROWTH
Culture: NO GROWTH

## 2016-04-12 ENCOUNTER — Telehealth: Payer: Self-pay | Admitting: *Deleted

## 2016-04-12 NOTE — Telephone Encounter (Signed)
DR wants me to schedule pt for a hosp f/u with you or DS. Please look at pt's Quantiferon results before I bring him in per DR. Thanks.

## 2016-04-12 NOTE — Telephone Encounter (Signed)
-----   Message from Laverle Hobby, MD sent at 04/10/2016  2:35 PM EDT ----- Regarding: hfu Pt needs HFU in 1-2 weeks with [facility] or [facility] (they both saw him) for possible TB and lung mass. Please check results of TB and AFB BEFORE bringing him into the office.   FYI he is Spanish speaking.

## 2016-04-13 NOTE — Telephone Encounter (Signed)
His QFT is positive.  He will need to start tx first for TB, at his local health department, before coming to the office.[name] He will also need a spanish interpreter at all pulmonary visits.  Follow up CT Chest with contrast in 3 months.     Thank you

## 2016-04-14 NOTE — Discharge Summary (Signed)
[facility] [place] NAME: Gregory Moody    MR#:  [id number]  DATE OF BIRTH:  10-Jan-1976  DATE OF ADMISSION:  04/06/2016 ADMITTING PHYSICIAN: Gregory Grayer, MD  DATE OF DISCHARGE: 04/10/2016  4:27 PM  PRIMARY CARE PHYSICIAN: No primary care provider on file.   ADMISSION DIAGNOSIS:  Community acquired pneumonia [J18.9] Hyperglycemia [R73.9] Abnormal chest x-ray [R93.8]  DISCHARGE DIAGNOSIS:  Active Problems:   Cavitary lesion of lung   Protein-calorie malnutrition, severe   SECONDARY DIAGNOSIS:   Past Medical History  Diagnosis Date  . Diabetes mellitus without complication [facility])      ADMITTING HISTORY  Gregory Moody is a 40 y.o. male with a known history of Diabetes. He came in with right wrist pain and right rib pain after having a fall at work. In the ER, he was found to have a very elevated sugar and slightly elevated anion gap. On chest x-rays found to have a pneumonia. CAT scan of the chest was showing a cavitary pneumonia. The patient has had a 50 pound weight loss in the past month. Does not complain of a cough or night sweats. He does have joint pains all over his body. He has been in this country for the last 7 years and no recent travel out of the country. No exposure to anybody with tuberculosis that he knows of. Spanish-speaking interpreter provided by the hospital.  HOSPITAL COURSE:   40 year old male with a history of diabetes who presented after a fall and found to have cavitary lesion on x-ray.  1. Uncontrolled diabetes:Hemoglobin A1c is greater than 16. Increased dose of NPH Continue ADA diet and Sliding scale insulin. Prescription for insulin Discharge  2. Right lung pneumonia. Pulmonary tuberculosis was considered. isolation precautions to rule out tuberculosis in the hospital Pending quanteferon TB test. AFB x 1 Negative Patient did not have a cough and was unable to produce any  sputum Pulmonary consult appreciated. May need bronchoscopy if unable to collect adequate sputum sample. Discussed case with Gregory Moody of infectious disease at [facility]. Patient had no fever, normal WBC, no cough or shortness of breath. His score deferred on test could likely be positive just due to exposure in the past. Also discussed with Gregory Moody of pulmonary so that patient can be scheduled as outpatient for bronchoscopy. We'll also need follow-up CT scan of the chest for his pneumonia. Treat as community acquired pneumonia  3. Fall with wrist pain: X-rays negative for acute fracture. Continue supportive care. 4. Hypokalemia: Replete when necessary.  5. Weight loss: TSH normal, HIV negative.   Stable for discharge home to follow-up with pulmonary.   CONSULTS OBTAINED:  Treatment Team:  Gregory Hobby, MD  DRUG ALLERGIES:  No Known Allergies  DISCHARGE MEDICATIONS:   Discharge Medication List as of 04/10/2016  2:23 PM    START taking these medications   Details  levofloxacin (LEVAQUIN) 500 MG tablet Take 1 tablet (500 mg total) by mouth daily., Starting 04/10/2016, Until Discontinued, Print      CONTINUE these medications which have CHANGED   Details  insulin NPH-regular Human (NOVOLIN 70/30) (70-30) 100 UNIT/ML injection Inject 70 Units into the skin 2 (two) times daily with a meal., Starting 04/10/2016, Until Discontinued, Print    metFORMIN (GLUCOPHAGE) 1000 MG tablet Take 1 tablet (1,000 mg total) by mouth 2 (two) times daily with a meal., Starting 04/10/2016, Until Discontinued, Print        Today  VITAL SIGNS:  Blood pressure 100/60, pulse 98, temperature 98.4 F (36.9 C), temperature source Oral, resp. rate 18, height 5\' 8"  (1.727 m), weight 50.621 kg (111 lb 9.6 oz), SpO2 100 %.  I/O:  No intake or output data in the 24 hours ending 04/14/16 1455  PHYSICAL EXAMINATION:  Physical Exam  GENERAL:  40 y.o.-year-old patient lying in the bed with no acute  distress.  LUNGS: Normal breath sounds bilaterally, no wheezing, rales,rhonchi or crepitation. No use of accessory muscles of respiration.  CARDIOVASCULAR: S1, S2 normal. No murmurs, rubs, or gallops.  ABDOMEN: Soft, non-tender, non-distended. Bowel sounds present. No organomegaly or mass.  NEUROLOGIC: Moves all 4 extremities. PSYCHIATRIC: The patient is alert and oriented x 3.  SKIN: No obvious rash, lesion, or ulcer.   DATA REVIEW:   CBC No results for input(s): WBC, HGB, HCT, PLT in the last 168 hours.  Chemistries   Recent Labs Lab 04/10/16 0608  NA 133*  K 4.0  CL 99*  CO2 29  GLUCOSE 208*  BUN 13  CREATININE 0.41*  CALCIUM 8.6*    Cardiac Enzymes No results for input(s): TROPONINI in the last 168 hours.  Microbiology Results  Results for orders placed or performed during the hospital encounter of 04/06/16  Culture, blood (routine x 2)     Status: None   Collection Time: 04/06/16  6:40 PM  Result Value Ref Range Status   Specimen Description BLOOD LEFT ARM  Final   Special Requests   Final    BOTTLES DRAWN AEROBIC AND ANAEROBIC  AERO 14CC ANA [place]   Culture NO GROWTH 5 DAYS  Final   Report Status 04/11/2016 FINAL  Final  Culture, blood (routine x 2)     Status: None   Collection Time: 04/06/16  6:48 PM  Result Value Ref Range Status   Specimen Description BLOOD RIGHT ASSIST CONTROL  Final   Special Requests   Final    BOTTLES DRAWN AEROBIC AND ANAEROBIC  AERO 20CC ANA 25CC   Culture NO GROWTH 5 DAYS  Final   Report Status 04/11/2016 FINAL  Final  Acid Fast Smear (AFB)     Status: None   Collection Time: 04/07/16  6:00 PM  Result Value Ref Range Status   AFB Specimen Processing Concentration  Final   Acid Fast Smear Negative  Final    Comment: (NOTE) Performed At: [facility] [address] [place], [place] [id number] Lindon Romp MD [id number]    Source (AFB) SPUTUM  Final    Comment: Performed at [facility]  Urine culture      Status: Abnormal   Collection Time: 04/08/16  1:22 AM  Result Value Ref Range Status   Specimen Description URINE, CLEAN CATCH  Final   Special Requests Normal  Final   Culture (A)  Final    8,000 COLONIES/mL INSIGNIFICANT GROWTH Performed at [facility]    Report Status 04/09/2016 FINAL  Final  MRSA PCR Screening     Status: None   Collection Time: 04/08/16  6:20 PM  Result Value Ref Range Status   MRSA by PCR NEGATIVE NEGATIVE Final    Comment:        The GeneXpert MRSA Assay (FDA approved for NASAL specimens only), is one component of a comprehensive MRSA colonization surveillance program. It is not intended to diagnose MRSA infection nor to guide or monitor treatment for MRSA infections.     RADIOLOGY:  No results found.  Follow up  with PCP in 1 week.  Management plans discussed with the patient, family and they are in agreement.  CODE STATUS:  Code Status History    Date Active Date Inactive Code Status Order ID Comments User Context   04/06/2016  7:07 PM 04/10/2016  7:28 PM Full Code [id number]  Gregory Grayer, MD ED      TOTAL TIME TAKING CARE OF THIS PATIENT ON DAY OF DISCHARGE: more than 30 minutes.   Hillary Bow R M.D on 04/14/2016 at 2:55 PM  Between 7am to 6pm - Pager - [phone]  After 6pm go to [email or web address] - password [email or web address] [facility]  [facility]  Office  [phone]  CC: Primary care physician; No primary care provider on file.  Note: This dictation was prepared with Dragon dictation along with smaller phrase technology. Any transcriptional errors that result from this process are unintentional.

## 2016-04-14 NOTE — Telephone Encounter (Signed)
No answer and no VM available. Lenda Kelp called to speak with pt for me due to pt speaking spanish only.

## 2016-04-15 NOTE — Telephone Encounter (Signed)
Tried to call pt but someone stated when I called [phone] that I had wrong number. Called a sister's number and it states number can't go thru at this time and tried other sister and number will not go thru. Will try again later.

## 2016-04-16 LAB — EXPECTORATED SPUTUM ASSESSMENT W REFEX TO RESP CULTURE

## 2016-04-16 LAB — EXPECTORATED SPUTUM ASSESSMENT W GRAM STAIN, RFLX TO RESP C: Special Requests: NORMAL

## 2016-04-16 NOTE — Telephone Encounter (Signed)
Tried to call pt again and not able to contact anyone including family members. Please advise on the next step you would like for me to do.

## 2016-04-17 NOTE — Telephone Encounter (Signed)
We have tried all avenues to contact the patient.  I have spoken with our Microbiology lab, and they stated that Quantiferon Gold TB test is performed at [place] will notify the [facility] lab will notify the health department of positive Quant Gold test, since this is communicable disease.  I have also informed ID, Dr. Ola Spurr, who stated that this patient should go directly to the health department for treatment.  I personally have called the health department on 04/14/16, but was not able to establish contact.  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   Thank you VM

## 2016-04-19 NOTE — Telephone Encounter (Signed)
Spoke with Santiago Glad at the [facility] Dept and she ask that I fax the patient's positive TB result with pt's address and phone number. Will fax to [phone]. Nothing further needed.

## 2016-04-27 ENCOUNTER — Ambulatory Visit
Admission: RE | Admit: 2016-04-27 | Discharge: 2016-04-27 | Disposition: A | Discharge status: Home / Self Care | Payer: Self-pay | Source: Ambulatory Visit | Attending: Family Medicine | Admitting: Family Medicine

## 2016-04-27 ENCOUNTER — Other Ambulatory Visit (HOSPITAL_COMMUNITY): Payer: Self-pay | Admitting: Family Medicine

## 2016-04-27 DIAGNOSIS — R918 Other nonspecific abnormal finding of lung field: Secondary | ICD-10-CM | POA: Insufficient documentation

## 2016-04-27 DIAGNOSIS — R7611 Nonspecific reaction to tuberculin skin test without active tuberculosis: Secondary | ICD-10-CM

## 2016-04-27 DIAGNOSIS — R7612 Nonspecific reaction to cell mediated immunity measurement of gamma interferon antigen response without active tuberculosis: Secondary | ICD-10-CM | POA: Insufficient documentation

## 2016-04-27 IMAGING — CR DG CHEST 1V
1 series · 1 of 1 positions shown · non-contrast
Comparison: [DATE] and CT [DATE]

CLINICAL DATA: Positive PPD and asymptomatic. History of recent
pneumonia.

EXAM:
CHEST 1 VIEW

[chest pa]
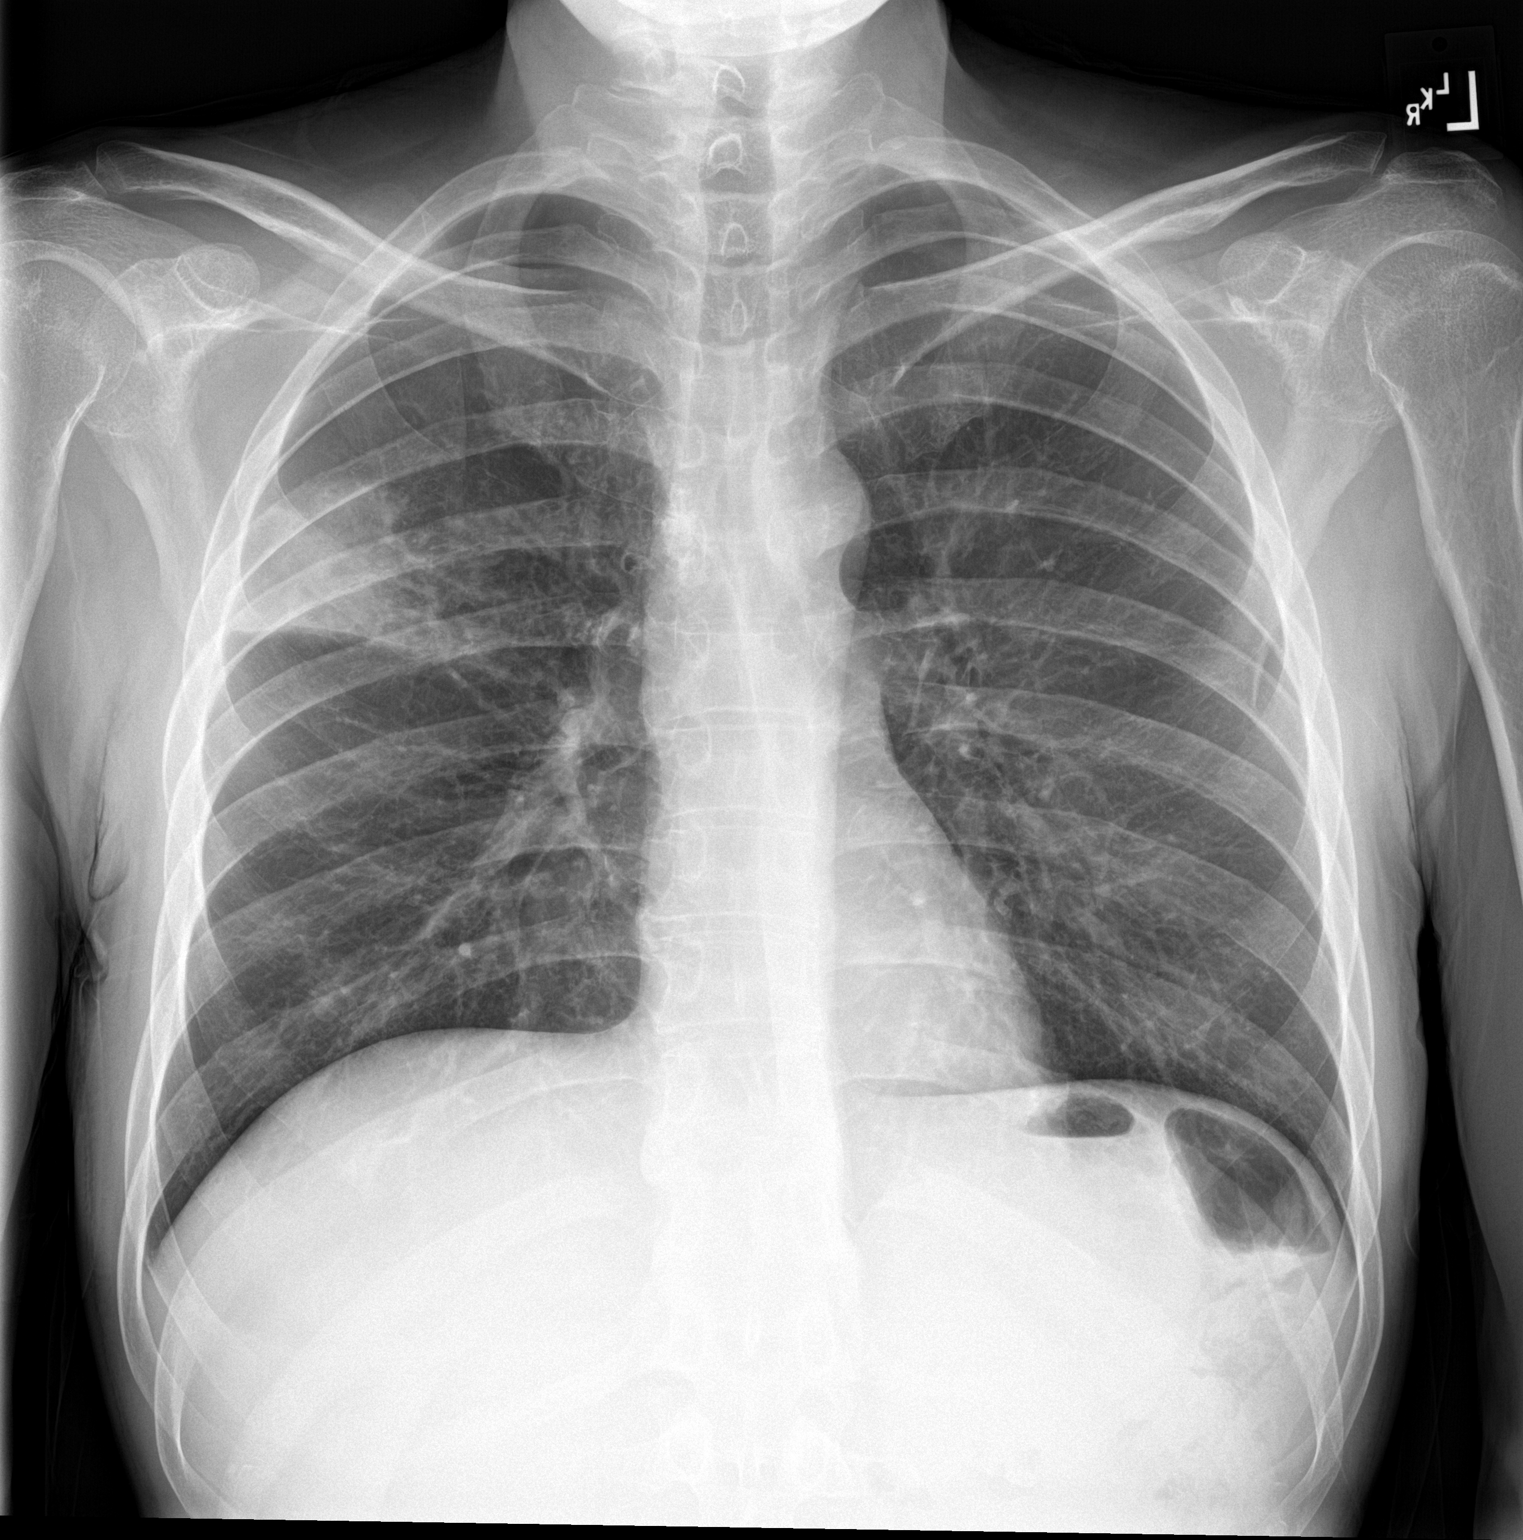

[1 of 1 positions shown; findings below may reference images not displayed]

FINDINGS: Lungs are adequately inflated and demonstrate persistent airspace
consolidation over the right upper lobe unchanged to slightly worse.
No evidence of effusion. Cardiomediastinal silhouette and remainder
of the exam is unchanged.
IMPRESSION: Persistent consolidation over the right upper lobe unchanged to
slightly worse likely due to ongoing infectious process as would
consider more atypical infections. Mycobacterium is not excluded.

## 2016-07-02 DIAGNOSIS — Z8611 Personal history of tuberculosis: Secondary | ICD-10-CM | POA: Insufficient documentation

## 2016-07-02 DIAGNOSIS — F4321 Adjustment disorder with depressed mood: Secondary | ICD-10-CM | POA: Insufficient documentation

## 2016-12-21 ENCOUNTER — Ambulatory Visit
Admission: RE | Admit: 2016-12-21 | Discharge: 2016-12-21 | Disposition: A | Discharge status: Home / Self Care | Payer: Self-pay | Source: Ambulatory Visit | Attending: Family Medicine | Admitting: Family Medicine

## 2016-12-21 ENCOUNTER — Other Ambulatory Visit (HOSPITAL_COMMUNITY): Payer: Self-pay | Admitting: Family Medicine

## 2016-12-21 DIAGNOSIS — A159 Respiratory tuberculosis unspecified: Secondary | ICD-10-CM

## 2016-12-21 IMAGING — CR DG CHEST 2V
1 series · 2 of 2 positions shown · non-contrast
Comparison: [DATE]

CLINICAL DATA: Tuberculosis Check

EXAM:
CHEST  2 VIEW

[Series 1: dg chest 2 view · 0.14mm/px · 2 of 2 slices shown]
[im 1/2]
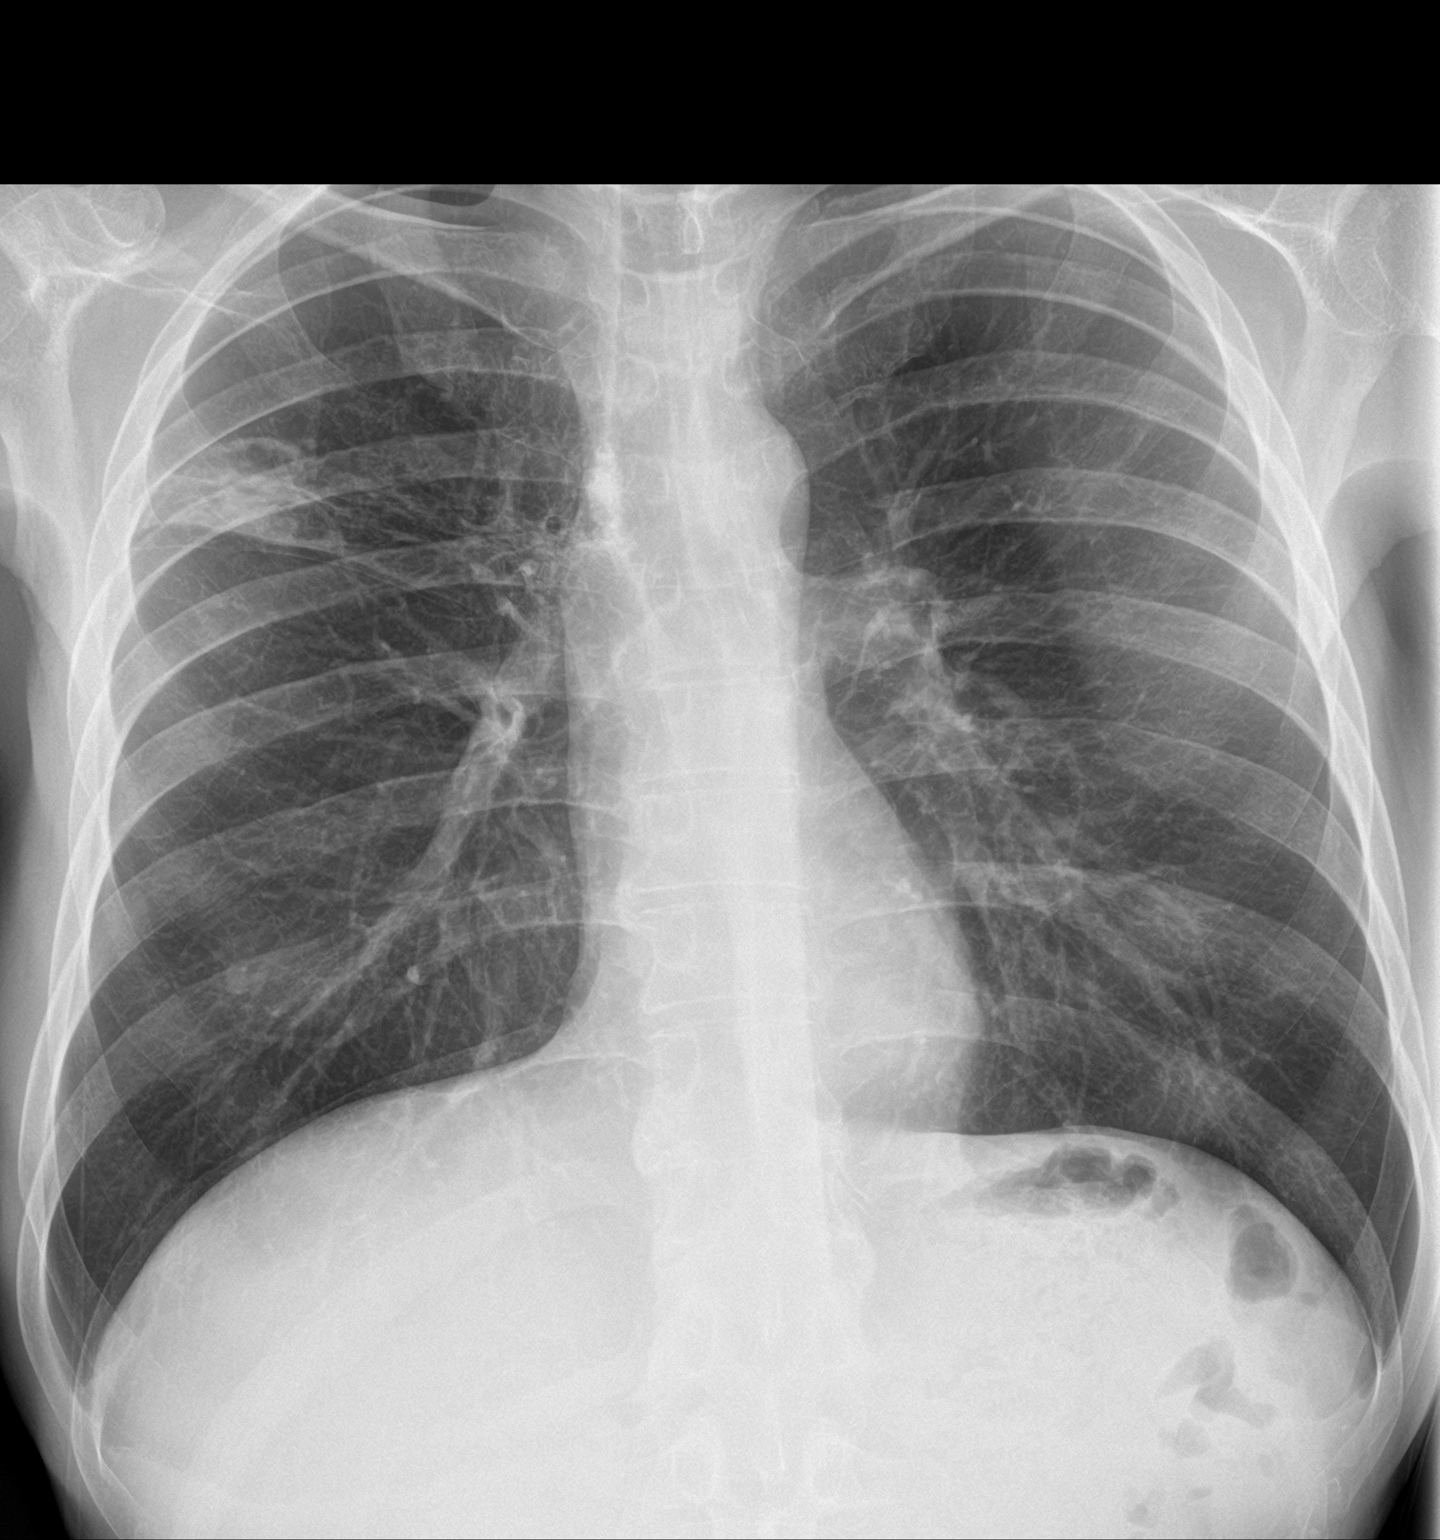
[im 2/2]
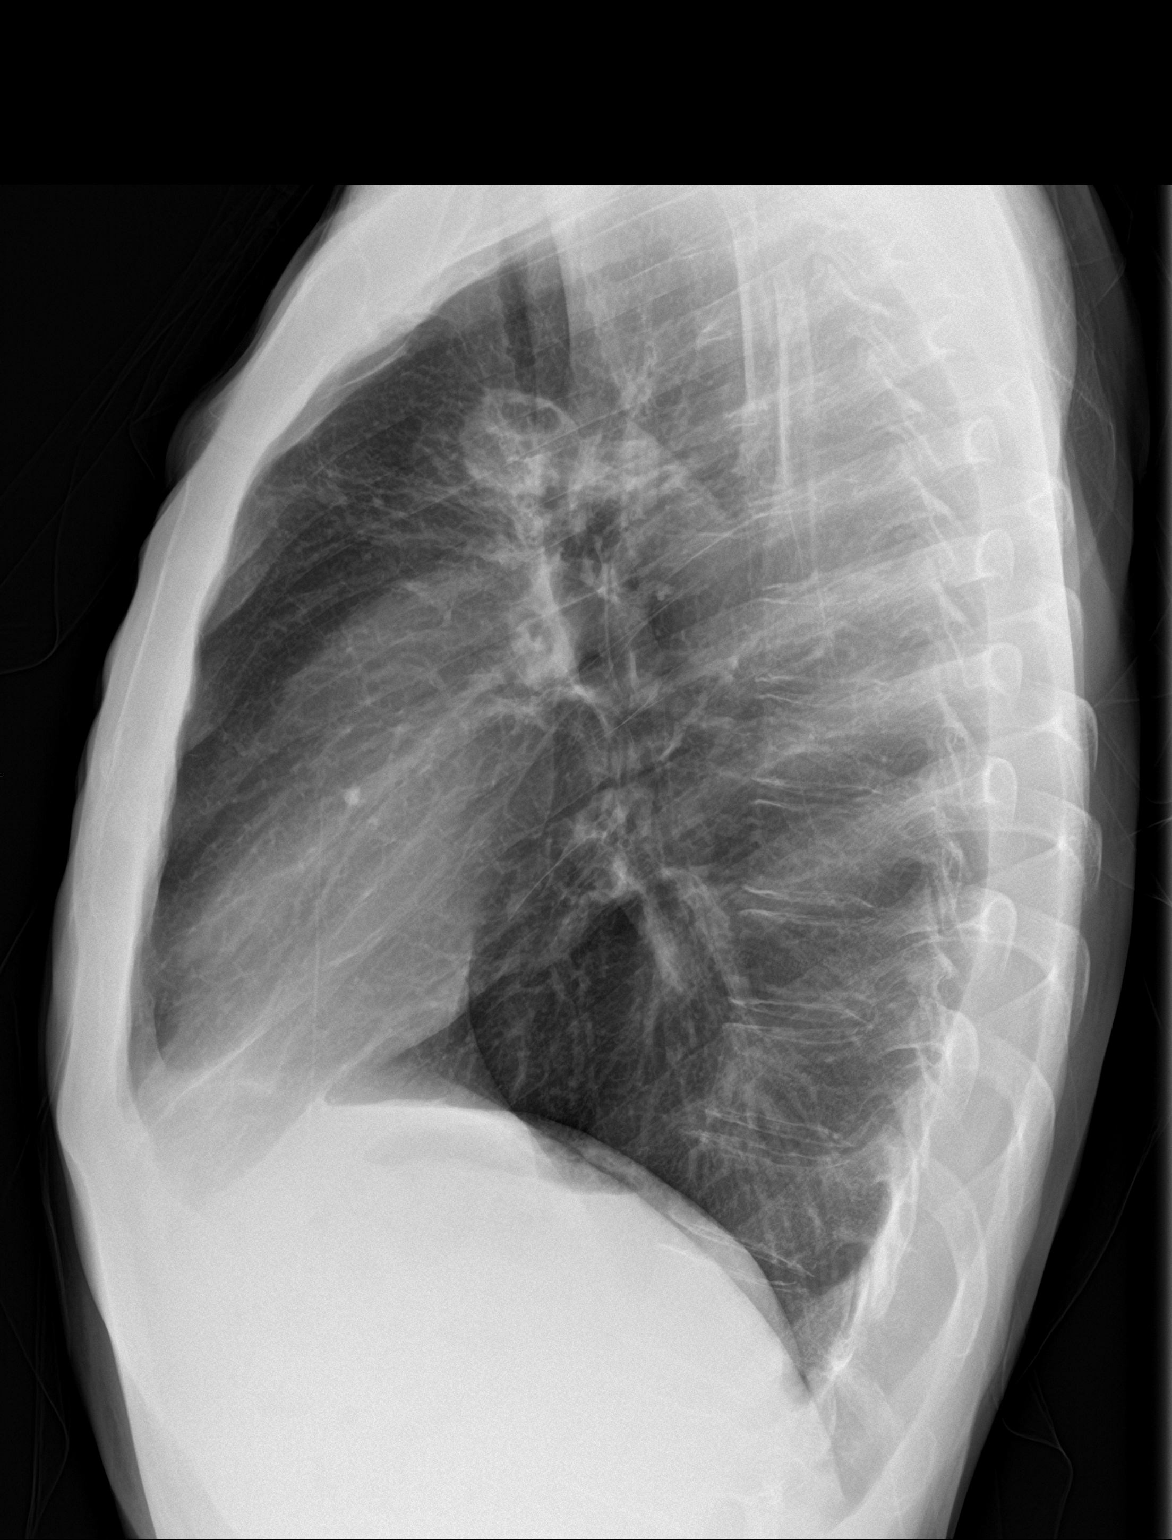

[2 of 2 positions shown; findings below may reference images not displayed]

FINDINGS: Persistent density noted peripherally in the right upper lobe,
decreased since prior study. There now appears to be a cavitary
area, best seen on the lateral view. No new airspace opacities or
effusions. Heart is normal size. No acute bony abnormality.
IMPRESSION: Decreasing but persistent airspace opacity in the peripheral right
upper lobe which now appears partially cavitary.

## 2017-02-26 DIAGNOSIS — H5702 Anisocoria: Secondary | ICD-10-CM | POA: Insufficient documentation

## 2017-04-17 ENCOUNTER — Encounter: Payer: Self-pay | Admitting: Emergency Medicine

## 2017-04-17 ENCOUNTER — Emergency Department: Payer: Self-pay

## 2017-04-17 ENCOUNTER — Inpatient Hospital Stay
Admission: EM | Admit: 2017-04-17 | Discharge: 2017-04-19 | DRG: 637 | Disposition: A | Discharge status: Home / Self Care | Payer: Self-pay | Attending: Internal Medicine | Admitting: Internal Medicine

## 2017-04-17 DIAGNOSIS — R739 Hyperglycemia, unspecified: Secondary | ICD-10-CM

## 2017-04-17 DIAGNOSIS — Z9111 Patient's noncompliance with dietary regimen: Secondary | ICD-10-CM

## 2017-04-17 DIAGNOSIS — Z833 Family history of diabetes mellitus: Secondary | ICD-10-CM

## 2017-04-17 DIAGNOSIS — E1165 Type 2 diabetes mellitus with hyperglycemia: Principal | ICD-10-CM | POA: Diagnosis present

## 2017-04-17 DIAGNOSIS — Y92009 Unspecified place in unspecified non-institutional (private) residence as the place of occurrence of the external cause: Secondary | ICD-10-CM

## 2017-04-17 DIAGNOSIS — R55 Syncope and collapse: Secondary | ICD-10-CM | POA: Diagnosis present

## 2017-04-17 DIAGNOSIS — Z794 Long term (current) use of insulin: Secondary | ICD-10-CM

## 2017-04-17 DIAGNOSIS — E43 Unspecified severe protein-calorie malnutrition: Secondary | ICD-10-CM | POA: Diagnosis present

## 2017-04-17 DIAGNOSIS — S72112A Displaced fracture of greater trochanter of left femur, initial encounter for closed fracture: Secondary | ICD-10-CM | POA: Diagnosis present

## 2017-04-17 DIAGNOSIS — S72009A Fracture of unspecified part of neck of unspecified femur, initial encounter for closed fracture: Secondary | ICD-10-CM

## 2017-04-17 DIAGNOSIS — E860 Dehydration: Secondary | ICD-10-CM | POA: Diagnosis present

## 2017-04-17 DIAGNOSIS — K3184 Gastroparesis: Secondary | ICD-10-CM | POA: Diagnosis present

## 2017-04-17 DIAGNOSIS — E871 Hypo-osmolality and hyponatremia: Secondary | ICD-10-CM | POA: Diagnosis present

## 2017-04-17 DIAGNOSIS — Z8611 Personal history of tuberculosis: Secondary | ICD-10-CM

## 2017-04-17 DIAGNOSIS — E119 Type 2 diabetes mellitus without complications: Secondary | ICD-10-CM

## 2017-04-17 DIAGNOSIS — I959 Hypotension, unspecified: Secondary | ICD-10-CM | POA: Diagnosis present

## 2017-04-17 DIAGNOSIS — E1143 Type 2 diabetes mellitus with diabetic autonomic (poly)neuropathy: Secondary | ICD-10-CM | POA: Diagnosis present

## 2017-04-17 DIAGNOSIS — Z681 Body mass index (BMI) 19 or less, adult: Secondary | ICD-10-CM

## 2017-04-17 DIAGNOSIS — W1839XA Other fall on same level, initial encounter: Secondary | ICD-10-CM | POA: Diagnosis present

## 2017-04-17 DIAGNOSIS — S72002A Fracture of unspecified part of neck of left femur, initial encounter for closed fracture: Secondary | ICD-10-CM

## 2017-04-17 HISTORY — DX: Respiratory tuberculosis unspecified: A15.9

## 2017-04-17 HISTORY — DX: Gastroparesis: K31.84

## 2017-04-17 LAB — CBC WITH DIFFERENTIAL/PLATELET
BASOS ABS: 0 10*3/uL (ref 0–0.1)
BASOS PCT: 0 %
EOS ABS: 0 10*3/uL (ref 0–0.7)
EOS PCT: 0 %
HCT: 33.5 % — ABNORMAL LOW (ref 40.0–52.0)
Hemoglobin: 11.9 g/dL — ABNORMAL LOW (ref 13.0–18.0)
Lymphocytes Relative: 25 %
Lymphs Abs: 1.8 10*3/uL (ref 1.0–3.6)
MCH: 33.9 pg (ref 26.0–34.0)
MCHC: 35.7 g/dL (ref 32.0–36.0)
MCV: 94.9 fL (ref 80.0–100.0)
MONO ABS: 0.4 10*3/uL (ref 0.2–1.0)
Monocytes Relative: 5 %
Neutro Abs: 5.1 10*3/uL (ref 1.4–6.5)
Neutrophils Relative %: 70 %
PLATELETS: 233 10*3/uL (ref 150–440)
RBC: 3.53 MIL/uL — ABNORMAL LOW (ref 4.40–5.90)
RDW: 12.2 % (ref 11.5–14.5)
WBC: 7.3 10*3/uL (ref 3.8–10.6)

## 2017-04-17 LAB — BLOOD GAS, VENOUS
Acid-Base Excess: 2.3 mmol/L — ABNORMAL HIGH (ref 0.0–2.0)
Bicarbonate: 28.7 mmol/L — ABNORMAL HIGH (ref 20.0–28.0)
PATIENT TEMPERATURE: 37
PCO2 VEN: 52 mmHg (ref 44.0–60.0)
PH VEN: 7.35 (ref 7.250–7.430)
pO2, Ven: <31 mmHg — CL (ref 32.0–45.0)

## 2017-04-17 LAB — LACTIC ACID, PLASMA: LACTIC ACID, VENOUS: 1.3 mmol/L (ref 0.5–1.9)

## 2017-04-17 LAB — URINALYSIS, COMPLETE (UACMP) WITH MICROSCOPIC
Bacteria, UA: NONE SEEN
Bilirubin Urine: NEGATIVE
HGB URINE DIPSTICK: NEGATIVE
KETONES UR: 5 mg/dL — AB
LEUKOCYTES UA: NEGATIVE
Nitrite: NEGATIVE
PROTEIN: NEGATIVE mg/dL
RBC / HPF: NONE SEEN RBC/hpf (ref 0–5)
SQUAMOUS EPITHELIAL / LPF: NONE SEEN
Specific Gravity, Urine: 1.032 — ABNORMAL HIGH (ref 1.005–1.030)
pH: 6 (ref 5.0–8.0)

## 2017-04-17 LAB — COMPREHENSIVE METABOLIC PANEL
ALBUMIN: 3.7 g/dL (ref 3.5–5.0)
ALT: 34 U/L (ref 17–63)
AST: 24 U/L (ref 15–41)
Alkaline Phosphatase: 85 U/L (ref 38–126)
Anion gap: 10 (ref 5–15)
BUN: 14 mg/dL (ref 6–20)
CHLORIDE: 94 mmol/L — AB (ref 101–111)
CO2: 25 mmol/L (ref 22–32)
Calcium: 8.5 mg/dL — ABNORMAL LOW (ref 8.9–10.3)
Creatinine, Ser: 0.64 mg/dL (ref 0.61–1.24)
GFR calc Af Amer: >60 mL/min (ref >60)
GFR calc non Af Amer: >60 mL/min (ref >60)
GLUCOSE: 655 mg/dL — AB (ref 65–99)
POTASSIUM: 3.8 mmol/L (ref 3.5–5.1)
Sodium: 129 mmol/L — ABNORMAL LOW (ref 135–145)
Total Bilirubin: 1 mg/dL (ref 0.3–1.2)
Total Protein: 6.4 g/dL — ABNORMAL LOW (ref 6.5–8.1)

## 2017-04-17 LAB — ETHANOL: Alcohol, Ethyl (B): <5 mg/dL (ref <5)

## 2017-04-17 LAB — GLUCOSE, CAPILLARY
GLUCOSE-CAPILLARY: 400 mg/dL — AB (ref 65–99)
Glucose-Capillary: 585 mg/dL (ref 65–99)

## 2017-04-17 LAB — PHOSPHORUS: PHOSPHORUS: 3.9 mg/dL (ref 2.5–4.6)

## 2017-04-17 LAB — LIPASE, BLOOD: LIPASE: 44 U/L (ref 11–51)

## 2017-04-17 IMAGING — CT CT HIP*L* W/O CM
2 of 3 series · 17 of 46 positions shown, 19 images · non-contrast
Comparison: Radiograph [DATE]

CLINICAL DATA: Syncopal episode unable to bear weight on the left
leg

EXAM:
CT OF THE LEFT HIP WITHOUT CONTRAST
TECHNIQUE: Multidetector CT imaging of the left hip was performed according to
the standard protocol. Multiplanar CT image reconstructions were
also generated.

[Series 3: axial st · axial · 0.34mm/px · z∈[-379,-257]mm · 14 of 71 slices shown, 16 images]
[im 5/71  soft-tissue]
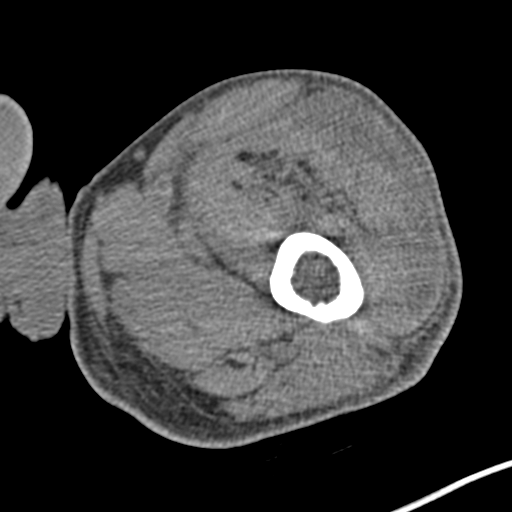
[im 5/71  bone]
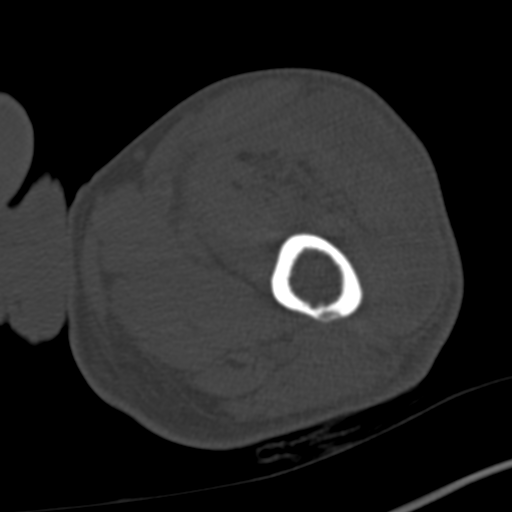
[im 10/71  soft-tissue]
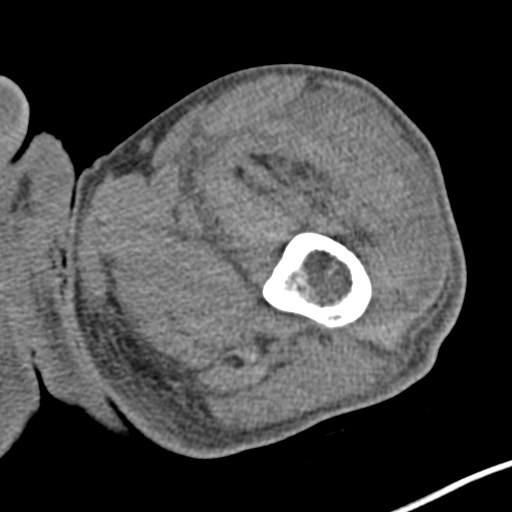
[im 14/71  soft-tissue]
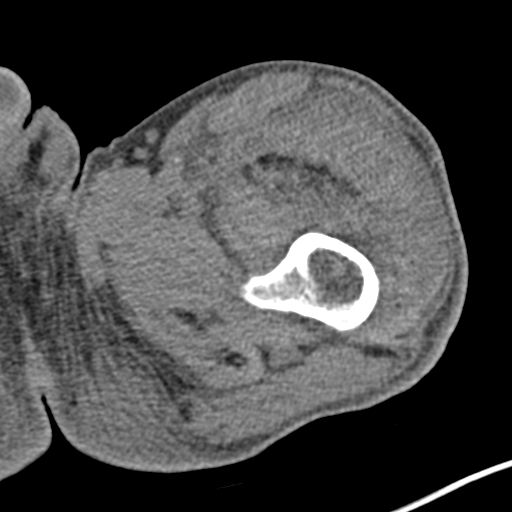
[im 19/71  soft-tissue]
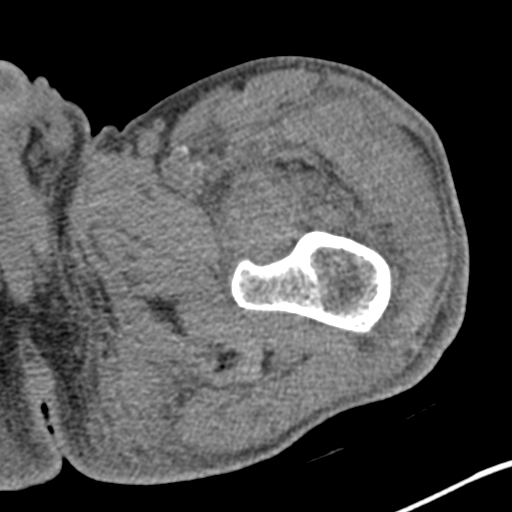
[im 23/71  soft-tissue]
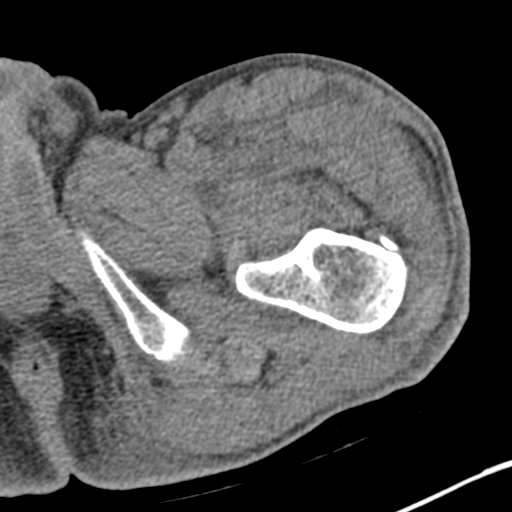
[im 28/71  soft-tissue]
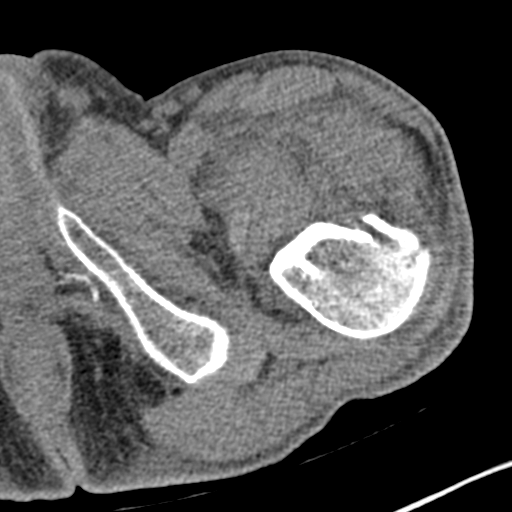
[im 32/71  soft-tissue]
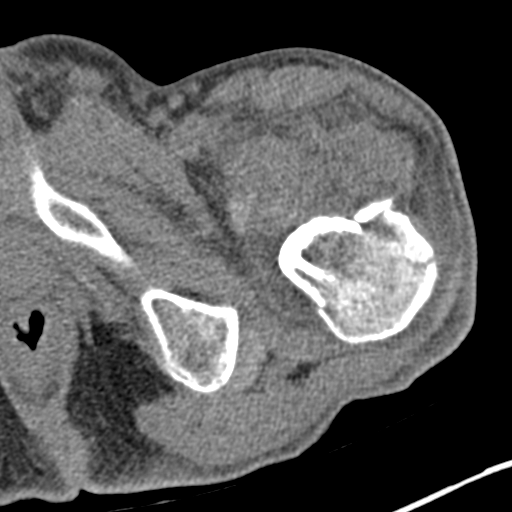
[im 39/71  soft-tissue]
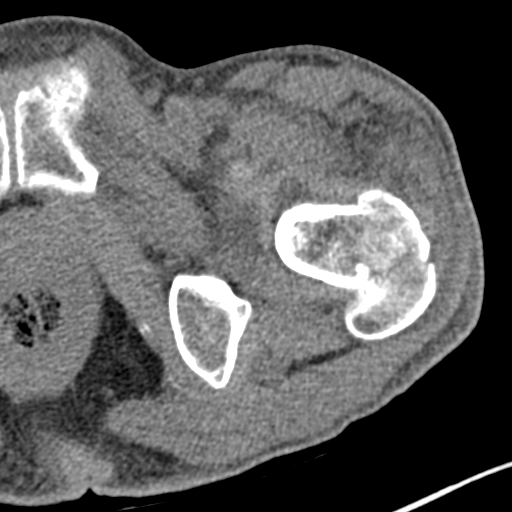
[im 43/71  soft-tissue]
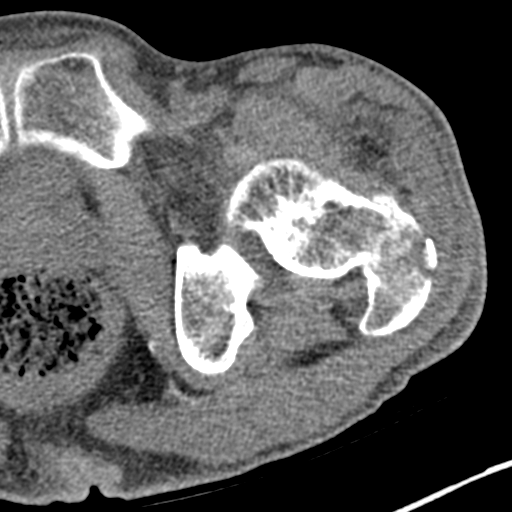
[im 43/71  bone]
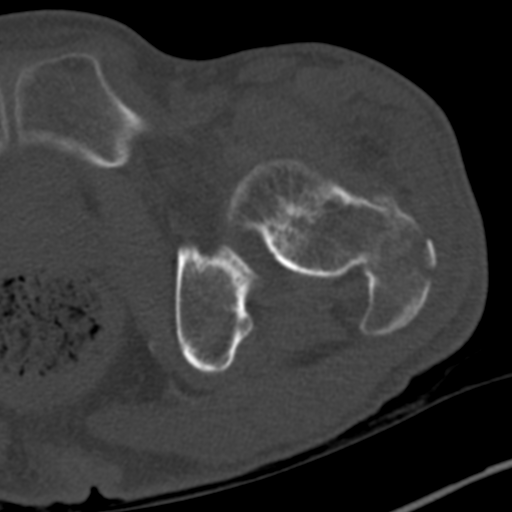
[im 48/71  soft-tissue]
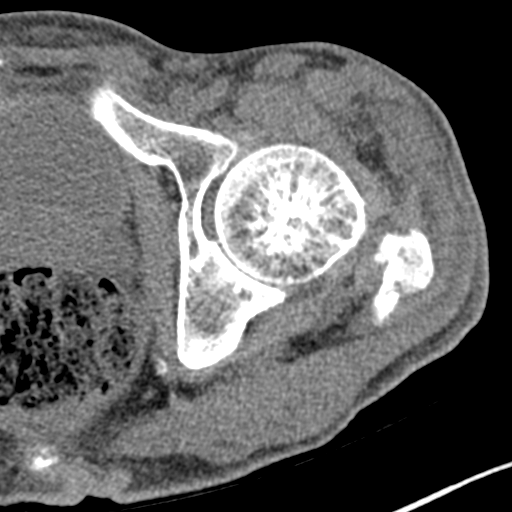
[im 52/71  soft-tissue]
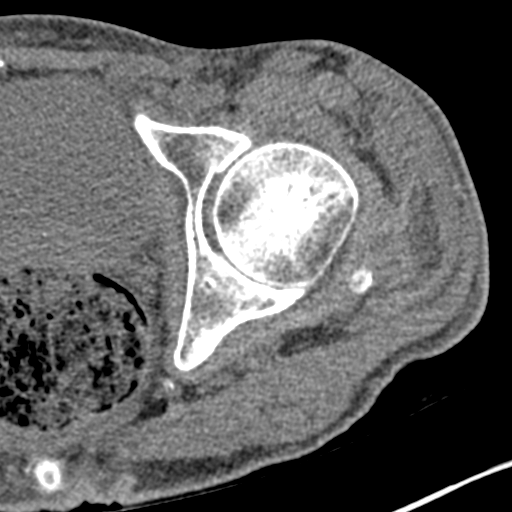
[im 57/71  soft-tissue]
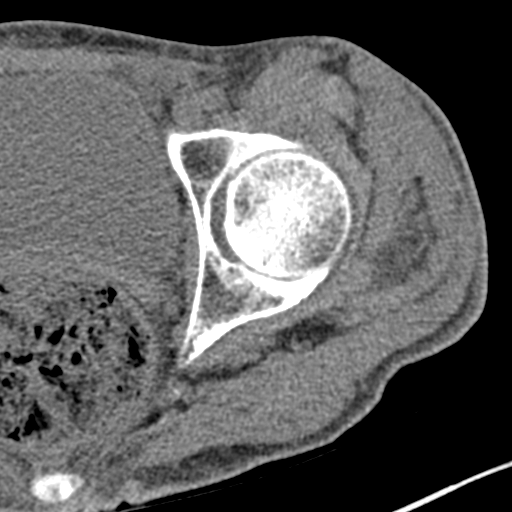
[im 61/71  soft-tissue]
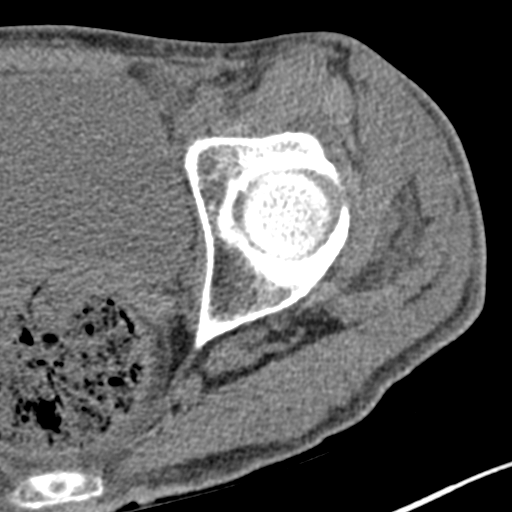
[im 66/71  soft-tissue]
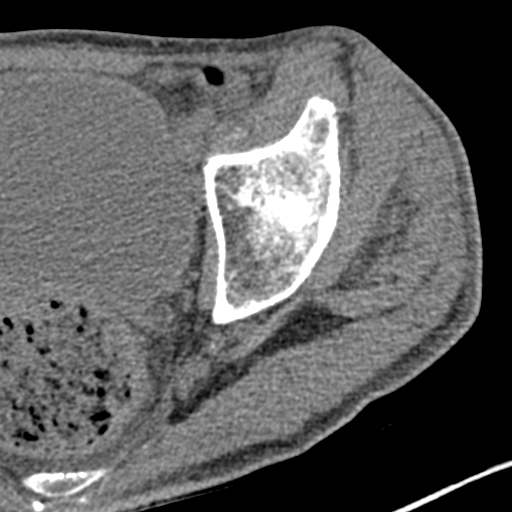

[Series 8: coronal st · coronal · 0.30mm/px · 3 of 73 slices shown]
[im 25/73  soft-tissue]
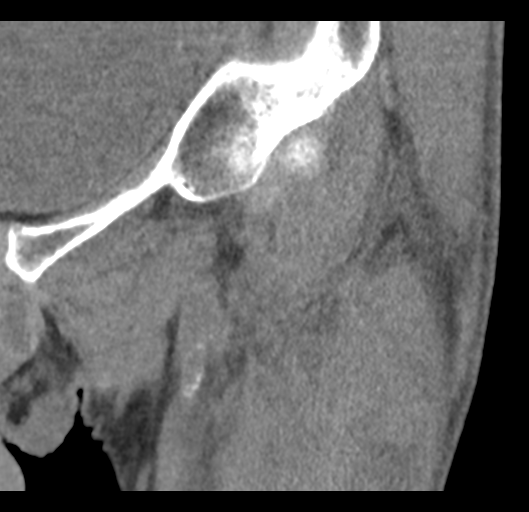
[im 33/73  soft-tissue]
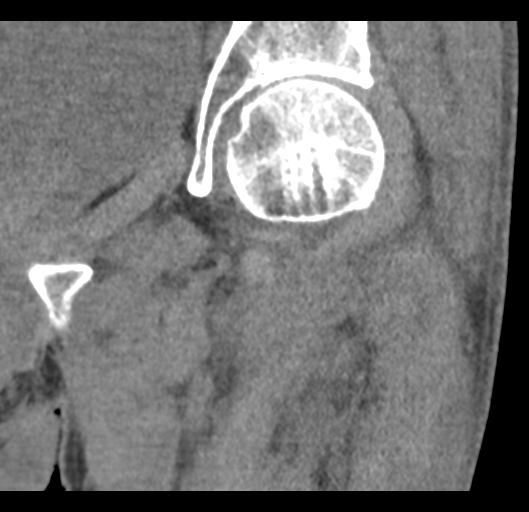
[im 41/73  soft-tissue]
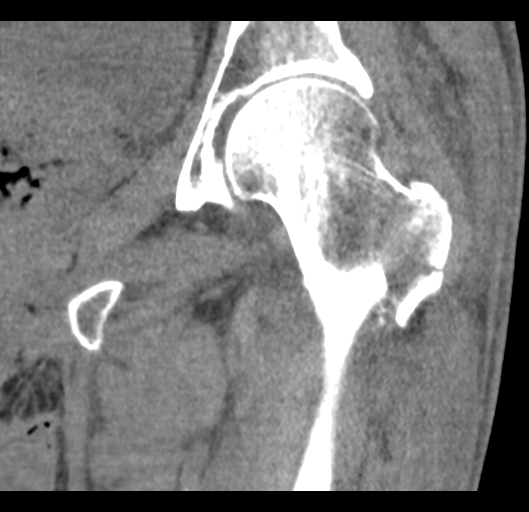

[17 of 46 positions shown; findings below may reference images not displayed]

FINDINGS: Bones/Joint/Cartilage

Comminuted fracture involving the greater trochanter of the proximal
left femur with mild separation of fracture fragments
superolaterally. Fracture lucency extends to the posterior, medial
cortex. 14 mm calcification superior to the left femoral neck may
represent nonspecific soft tissue calcification as opposed to
displaced bone fragment. No definite fracture lucency within the
lesser trochanter or femoral neck. No dislocation evident. Pubic
symphysis is intact. The rami show no fracture. No evidence for
acetabular fracture.

Ligaments

Suboptimally assessed by CT.

Muscles and Tendons

Normal muscle bulk. Edema and probable intramuscular hematoma of the
proximal, lateral thigh muscles and anterior hip muscles.

Soft tissues

Edema within the soft tissues. Vascular calcifications in the left
groin. Moderate feces retention in the rectum.
IMPRESSION: Comminuted fracture involving the greater trochanter of the left
femur with mild separation of fracture fragments superior,
laterally. No dislocation of the femoral head.

## 2017-04-17 IMAGING — CR DG HIP (WITH OR WITHOUT PELVIS) 2-3V*L*
3 series · 3 of 3 positions shown · non-contrast
Comparison: None

CLINICAL DATA: Fall.  Pain.

EXAM:
DG HIP (WITH OR WITHOUT PELVIS) 2-3V LEFT

[pelvis ap]
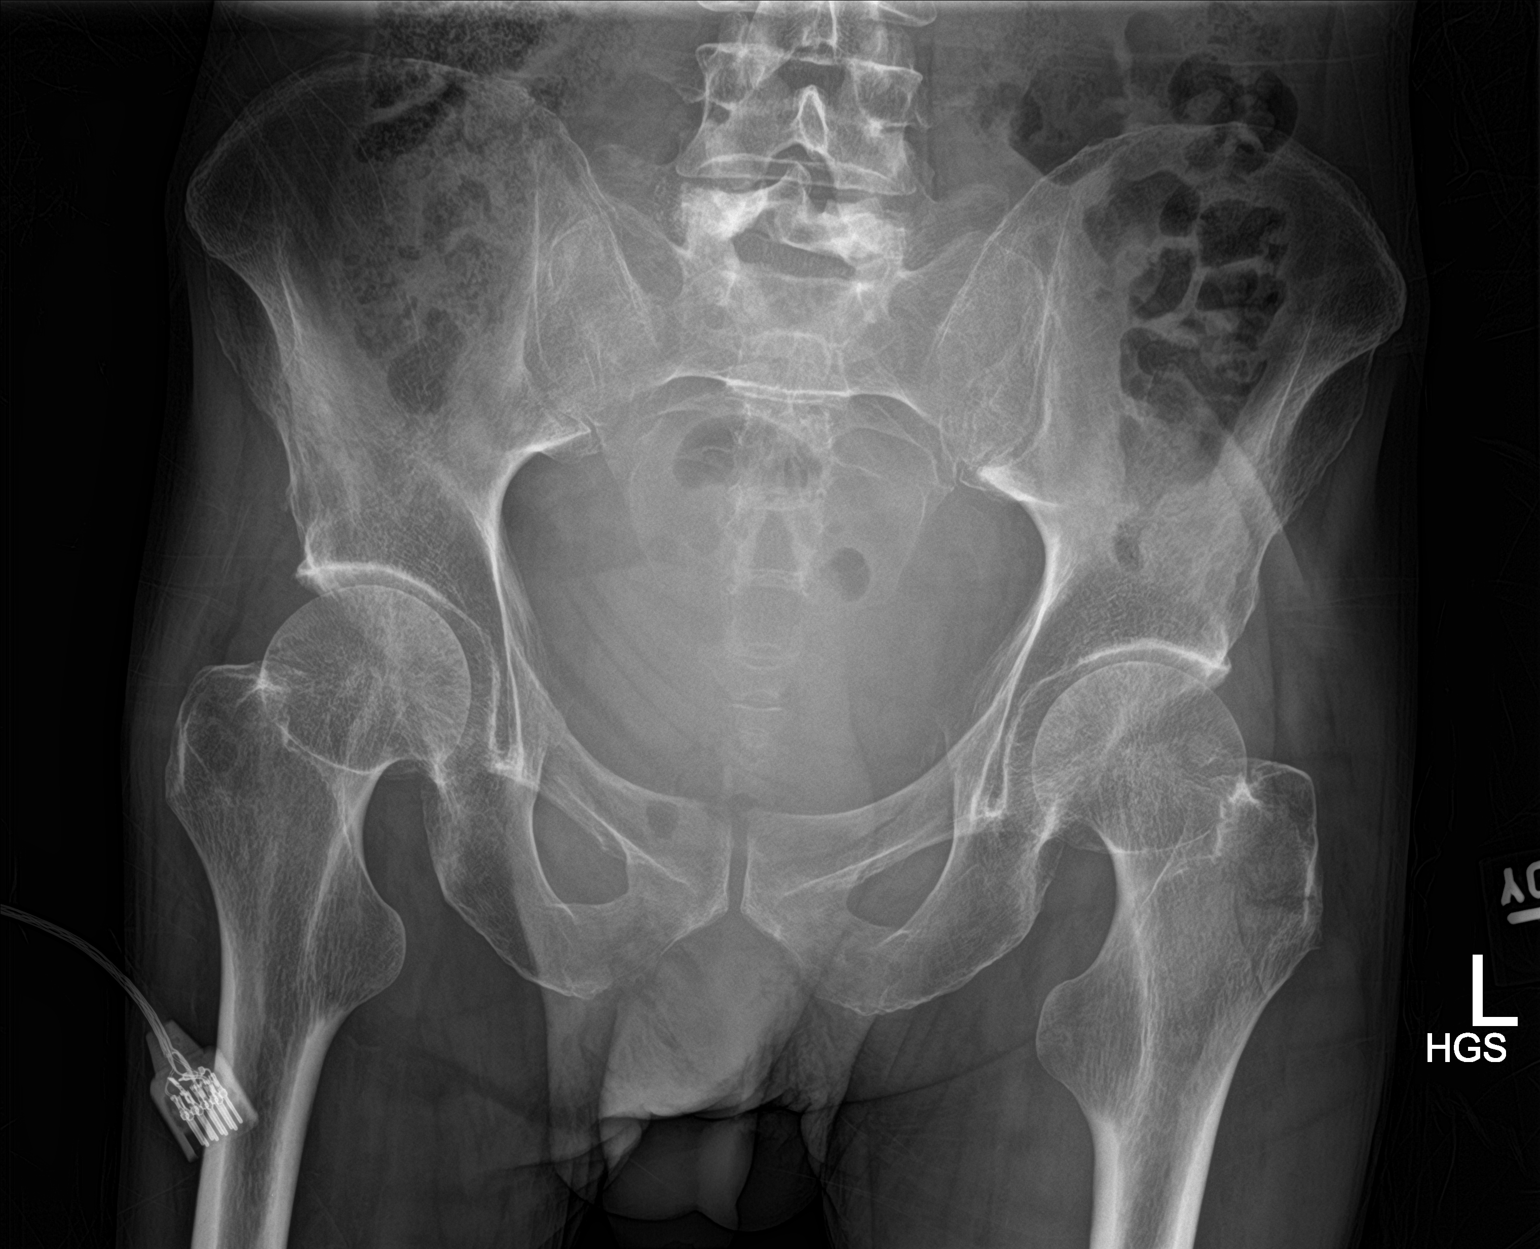

[hip ap]
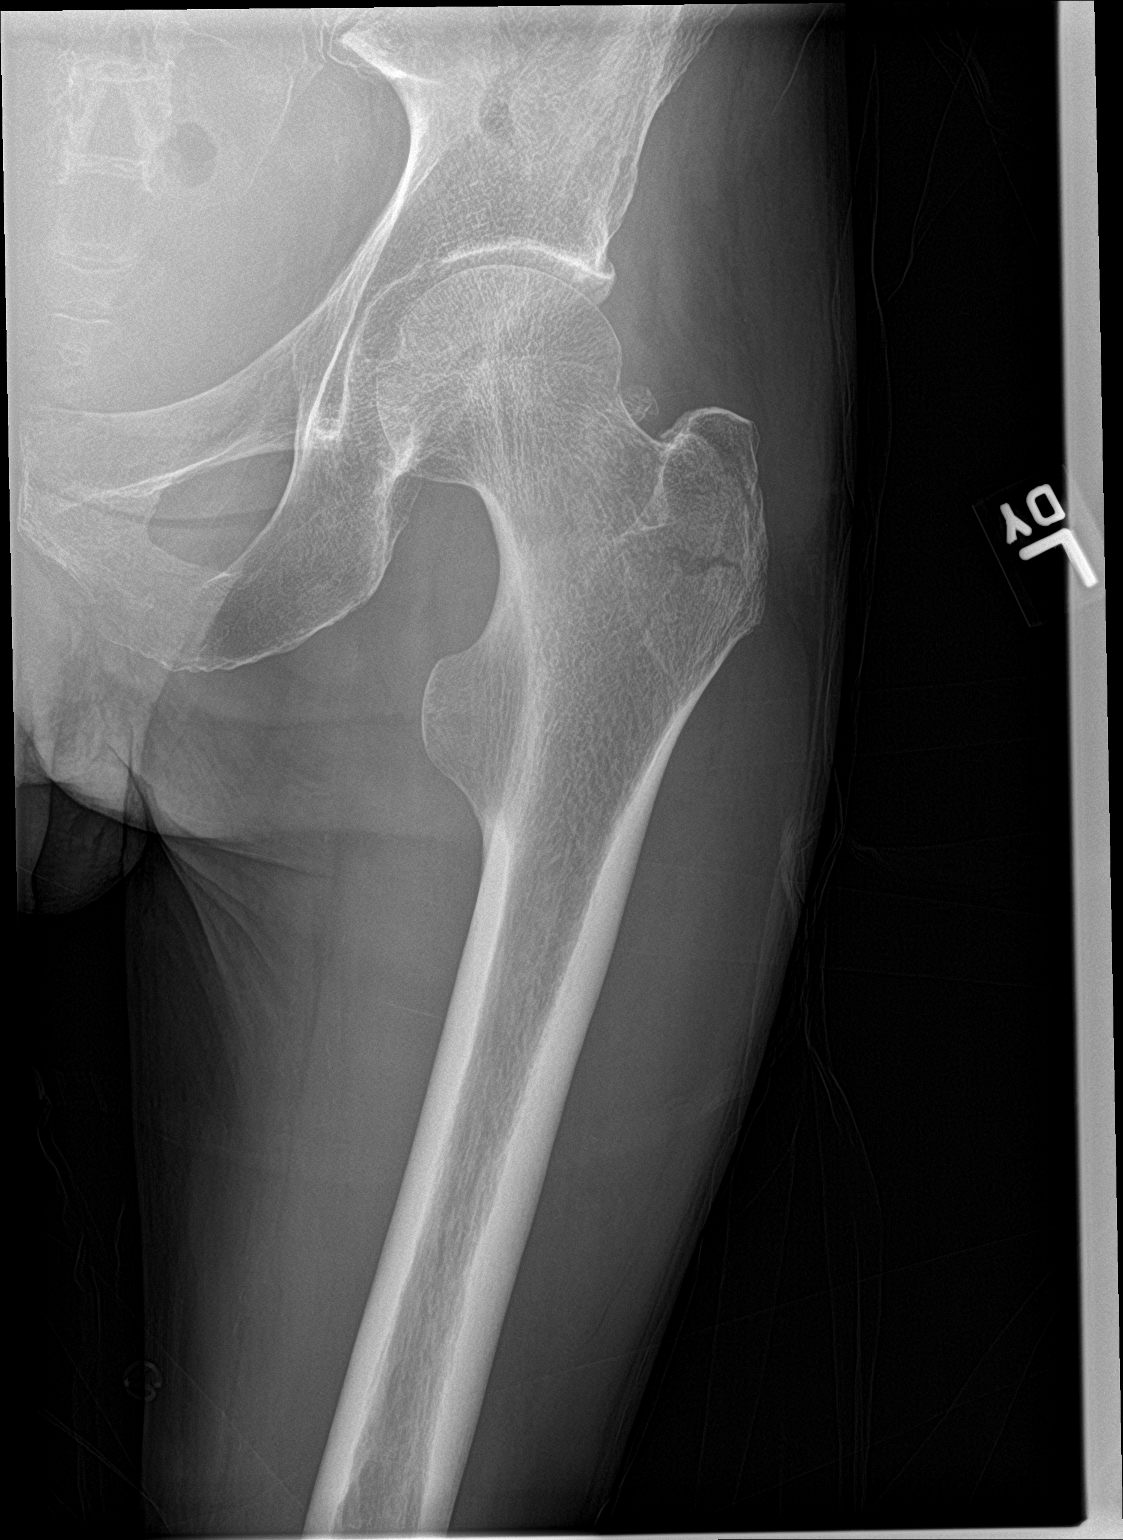

[hip lat]
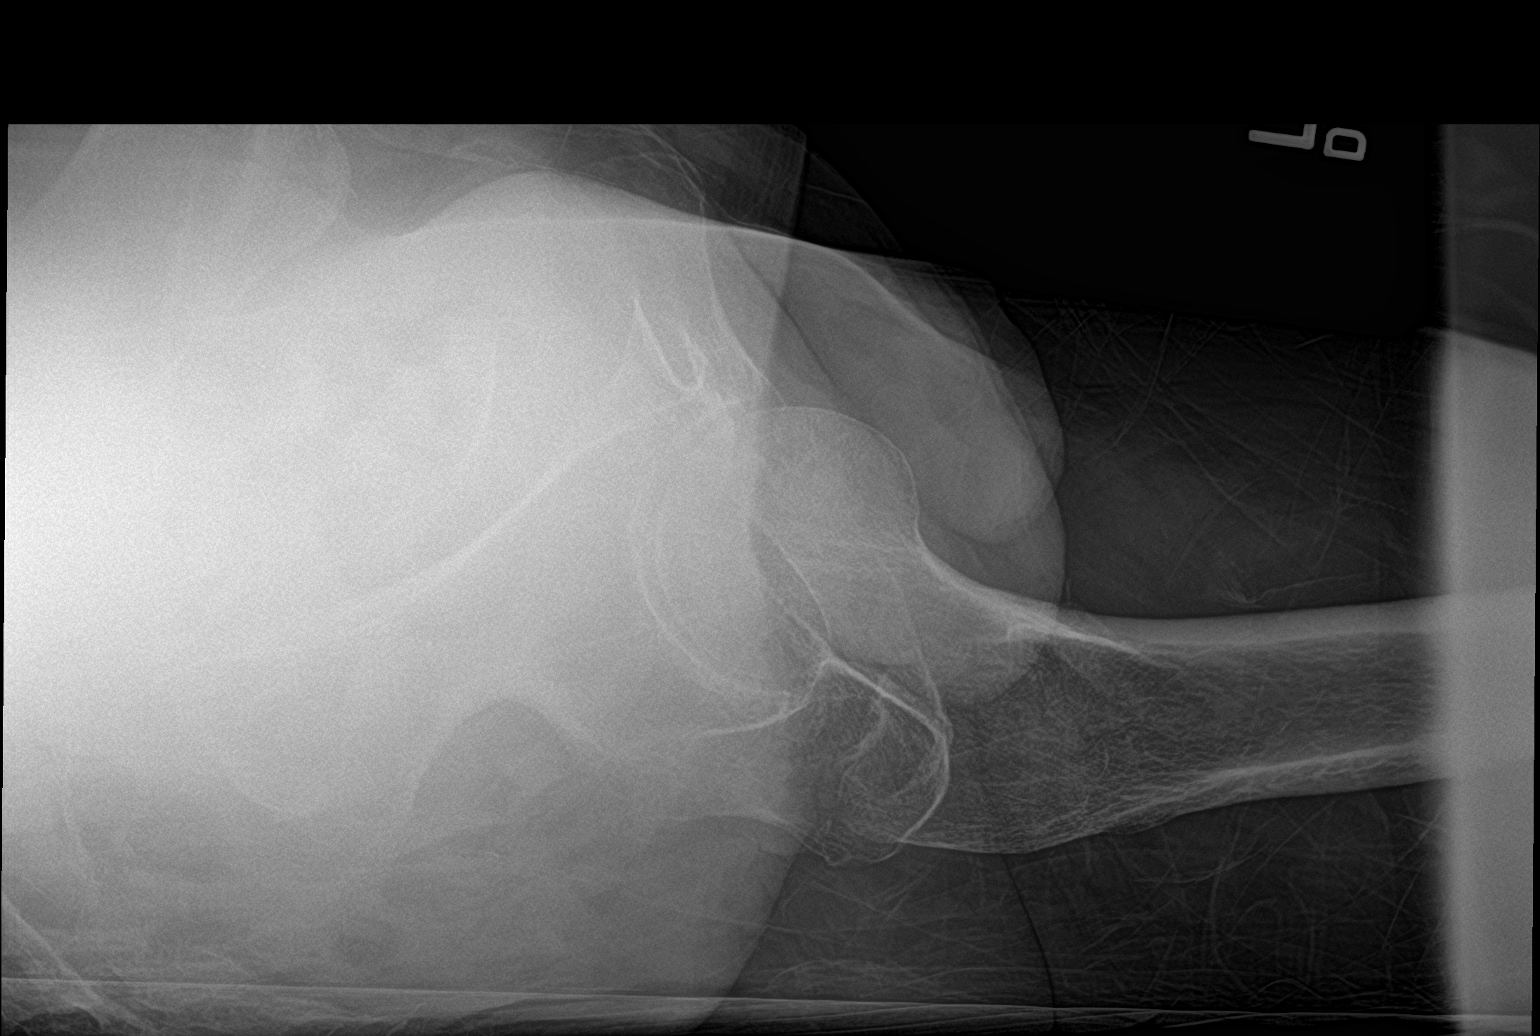

[3 of 3 positions shown; findings below may reference images not displayed]

FINDINGS: Femoral heads are located. Sacroiliac joints are symmetric.
Irregularity, most consistent with fracture in the greater
trochanteric region of the left femur.
IMPRESSION: Greater trochanteric left femur fracture.

## 2017-04-17 IMAGING — CR DG CHEST 1V
1 series · 1 of 1 positions shown · non-contrast
Comparison: [DATE]

CLINICAL DATA: Hyperglycemia.  Weakness.  Weight loss.

EXAM:
CHEST 1 VIEW

[chest pa]
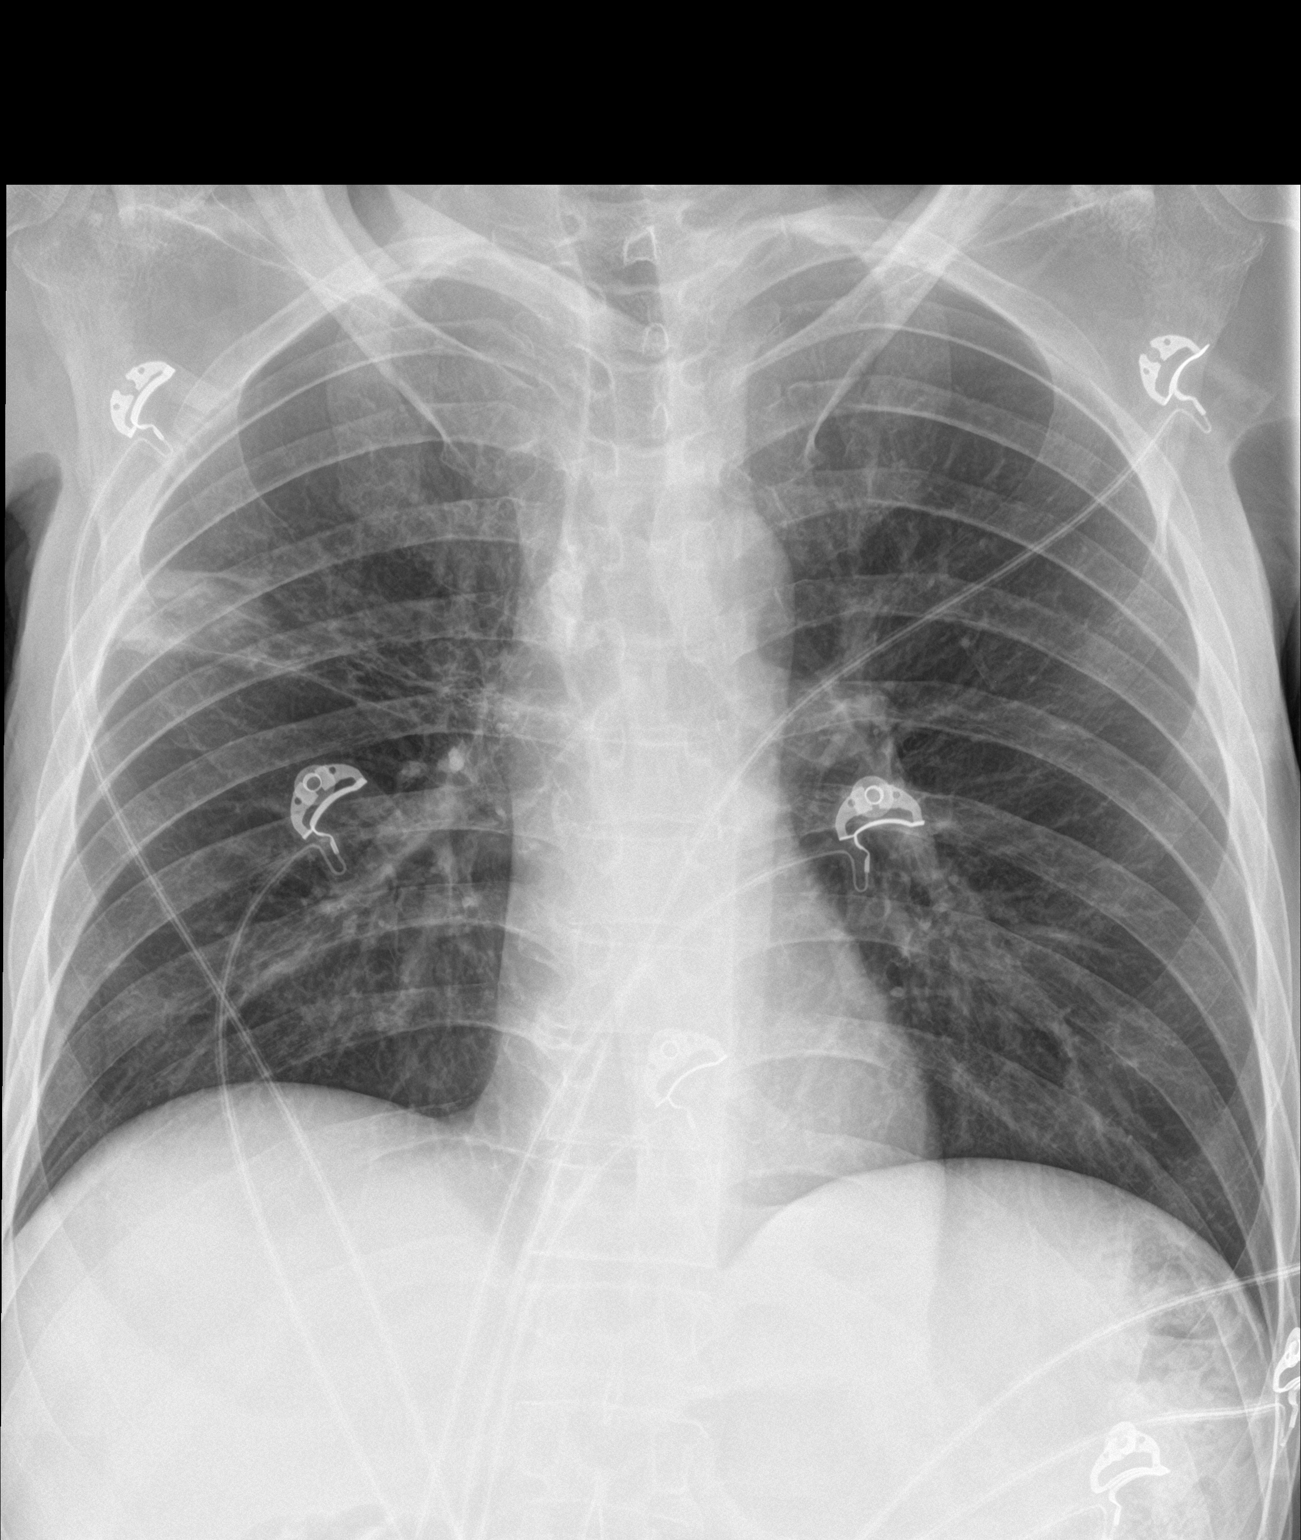

[1 of 1 positions shown; findings below may reference images not displayed]

FINDINGS: Mild hyperinflation. Midline trachea. Normal heart size. No pleural
effusion or pneumothorax. Mild improvement in inferior right upper
lobe airspace disease.
IMPRESSION: Decreasing inferior right upper lobe airspace disease. Favor
resolving infection and scar. No acute superimposed process.

## 2017-04-17 MED ORDER — HEPARIN SODIUM (PORCINE) 5000 UNIT/ML IJ SOLN: 5000.0000 [IU] | Freq: Three times a day (TID) | INTRAMUSCULAR | Status: DC

## 2017-04-17 MED ORDER — INSULIN ASPART 100 UNIT/ML ~~LOC~~ SOLN
10.0000 [IU] | Freq: Once | SUBCUTANEOUS | Status: AC
Start: 2017-04-17 — End: 2017-04-17
  Administered 2017-04-17: 10 [IU] via INTRAVENOUS
  Filled 2017-04-17: qty 1

## 2017-04-17 MED ORDER — MORPHINE SULFATE (PF) 4 MG/ML IV SOLN
4.0000 mg | Freq: Once | INTRAVENOUS | Status: DC
  Filled 2017-04-17: qty 1

## 2017-04-17 MED ORDER — SODIUM CHLORIDE 0.9 % IV BOLUS (SEPSIS)
1000.0000 mL | Freq: Once | INTRAVENOUS | Status: AC
  Administered 2017-04-17: 1000 mL via INTRAVENOUS

## 2017-04-17 MED ORDER — OXYCODONE-ACETAMINOPHEN 5-325 MG PO TABS
1.0000 | ORAL_TABLET | ORAL | Status: DC | PRN
  Administered 2017-04-18: 1 via ORAL
  Filled 2017-04-17: qty 1

## 2017-04-17 MED ORDER — DOCUSATE SODIUM 100 MG PO CAPS: 100.0000 mg | ORAL_CAPSULE | Freq: Two times a day (BID) | ORAL | Status: DC | PRN

## 2017-04-17 MED ORDER — MORPHINE SULFATE (PF) 2 MG/ML IV SOLN: 2.0000 mg | INTRAVENOUS | Status: DC | PRN

## 2017-04-17 MED ORDER — SENNOSIDES-DOCUSATE SODIUM 8.6-50 MG PO TABS
1.0000 | ORAL_TABLET | Freq: Two times a day (BID) | ORAL | Status: DC
  Administered 2017-04-18 (×2): 1 via ORAL
  Filled 2017-04-17 (×3): qty 1

## 2017-04-17 MED ORDER — KETOROLAC TROMETHAMINE 30 MG/ML IJ SOLN
15.0000 mg | INTRAMUSCULAR | Status: AC
  Administered 2017-04-17: 15 mg via INTRAVENOUS
  Filled 2017-04-17: qty 1

## 2017-04-17 MED ORDER — INSULIN ASPART PROT & ASPART (70-30 MIX) 100 UNIT/ML ~~LOC~~ SUSP
40.0000 [IU] | Freq: Two times a day (BID) | SUBCUTANEOUS | Status: DC
Start: 2017-04-18 — End: 2017-04-18
  Administered 2017-04-18: 40 [IU] via SUBCUTANEOUS
  Filled 2017-04-17: qty 10

## 2017-04-17 MED ORDER — INSULIN ASPART 100 UNIT/ML ~~LOC~~ SOLN
0.0000 [IU] | Freq: Three times a day (TID) | SUBCUTANEOUS | Status: DC
  Administered 2017-04-18: 9 [IU] via SUBCUTANEOUS
  Filled 2017-04-17: qty 1

## 2017-04-17 MED ORDER — SODIUM CHLORIDE 0.9 % IV SOLN
INTRAVENOUS | Status: DC
  Administered 2017-04-17: 23:00:00 via INTRAVENOUS

## 2017-04-17 NOTE — ED Notes (Signed)
This RN to bedside at this time to introduce self to patient. Pt is noted to be resting in bed with eyes closed, pt awakens with mild stimuli at this time. This RN reviewed proper call be use with patient, pt states understanding, denies any immediate needs at this time, pt instruced to use call bell for any future needs, will continue to monitor.

## 2017-04-17 NOTE — ED Notes (Signed)
Pt voided 966ml of urine in urinal; spoke with EDP and order to cancel bladder scan

## 2017-04-17 NOTE — ED Notes (Signed)
Date and time results received: 04/17/17 1425 (use smartphrase ".now" to insert current time)  Test: vbg Critical Value: po2<31  Name of Provider Notified: dr Joni Fears  Orders Received? Or Actions Taken?: will continue to monitor

## 2017-04-17 NOTE — ED Provider Notes (Signed)
[facility] Emergency Department Provider Note  ____________________________________________  Time seen: Approximately 1:38 PM  I have reviewed the triage vital signs and the nursing notes.   HISTORY  Chief Complaint Hyperglycemia and Loss of Consciousness  Patient has primary Spanish speaking but able to effectively communicate in English  HPI Gregory Moody is a 41 y.o. male comes to the ED due to syncope at home today. After passing out in the setting of feeling lightheaded on standing, patient found that he had pain in the left hip and was unable to bear weight. He has a history of diabetes and medication noncompliance with his insulin causing persistent hyperglycemia and dehydration. He was recently admitted at [facility] where he also was treated for H. pylori and candidal esophagitis. He was successfully treated for tuberculosis within the last year as well. HIV negative, extensive workup otherwise unremarkable as listed below from the [facility] notes.  Patient denies neck pain or headache or vision changes. No focal numbness tingling or weakness. The pain is worse with movement, better with stationary. Nonradiating, severe.     Past Medical History:  Diagnosis Date  . Diabetes mellitus without complication ([place])   . Gastroparesis    Allergies   No Known Allergies Current Medications    Prescription Sig. Disp. Refills Start Date End Date Status  blood sugar diagnostic (TRUETRACK TEST) Strp  by Other route Four (4) times a day (before meals and nightly). 200 each  2 10/29/2016  Active  blood-glucose meter (TRUETRACK BLOOD GLUCOSE SYSTEM) kit  Use as instructed 1 each  0 10/29/2016 10/29/2017 Active  gabapentin (NEURONTIN) 300 MG capsule  Take 1 capsule (300 mg total) by mouth Three (3) times a day. 90 capsule  0 03/09/2017  Active  hydrocortisone 2.5 % ointment  Apply 1 application topically two (2) times a day as needed. Apply as needed to  rash on body and legs 30 g  0 03/09/2017 03/09/2018 Active  insulin NPH-insulin regular, 70/30, (NOVOLIN 70/30 U-100 INSULIN) 100 unit/mL (70-30) injection  Inject 50 units under the skin before breakfast and 30 units before dinner 10 mL  0 03/09/2017  Active  lancets 33 gauge Misc  1 each by Miscellaneous route Four (4) times a day (before meals and nightly). 200 each  2 03/09/2017  Active  white petrolatum-mineral oil (EUCERIN) Crea  Apply topically 4 (four) times a day as needed (rash). 454 g  0 03/09/2017  Active  blood-glucose meter (ON CALL EXPRESS METER) kit  Use as instructed 1 each  0 03/09/2017 03/09/2018 Active  blood sugar diagnostic (ON CALL EXPRESS TEST STRIP) Strp  by Other route Four (4) times a day (before meals and nightly). 100 each  PRN 03/09/2017  Active  insulin syringe-needle U-100 1 mL 27 gauge x 1/2" Syrg  1 each by Miscellaneous route Two (2) times a day (30 minutes before a meal). Use with Novolin insulin 100 Syringe  11 03/09/2017  Active  loperamide (IMODIUM) 2 mg capsule  Take 1 capsule (2 mg total) by mouth Three (3) times a day. 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 Patient was sent to the hospital from a GI clinic visit d/t hypotension and additional work up needed.  # Chronic diarrhea # Weight  loss Patient presented with 10 - 12 month history of diarrhea and weight loss. Extensive workup was conducted. An abdominal CT showed no bowel obstruction, no acute inflammatory process in the bowel, no ascites, and incidental note of a small bowel intussusception. Nutrition was consulted and Ensure supplements were started in addition to regular diet. Patient underwent EGD/colonoscopy on 5/21. Patient was found to have esophageal candidiasis, h.pylori. Fluconazole 200 mg PO daily x 14 days was initiated. Risk factors for this infection include diabetes and prolonged antibiotic use (s/p TB treatment with RIPE). Infectious workup including HIV was negative. Exhaustive workup negative so far. Therefore, diabetic diarrhea is leading diagnosis although is a diagnosis of exclusion. Ultimately, patient's diarrhea was slowed with imodium. Outpatient followup with GI needed, scheduled for 06/09/17.  Workup thus far is as follows:  Normal/negative: GI pathogen panel, C diff, Stool O&P, SPEP, Tissue transglutaminase IgA, CRP, fecal fat, guaiac, HIV, biopsy from terminal ileum showed minimal acute neutrophilic cryptitis, consistent with acute self-limited colitis and no evidence for involvement of mucosa by TB. Cosyntropin stim test. Fecal pancreatic elastase.  Abnormal: Slightly elevated total IgA suggests possibly inflammatory disorder but is non-specific, and CRP was normal suggesting not inflammatory. 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 Calprotectin stool borderline. Fasting gastrin level elevated at 203, however this could be secondary to PPI.  Pending:  - HTLV - I/II - VIP, glucagon - GAD, anti-islet antibody  # Pulmonary Tuberculosis, s/p Treatment Failed sputum induction x 3. S/P RIPE x 6 months (DOT) with adherence. CT chest with increased necrosis in RUL cavitary lesion; small volume free fluid in abd/pelvis; focal short  segment transient intussusception of jejunal loop without obstruction. Discussed with [facility] TB Coordinator, Aileen Fass. Patient received appropriate treatment and had a post-treatment CXR that demonstrated response to treatment.   # H. Pylori EGD on 5/21 revealed H. pylori gastritis. Quad-therapy was initiated on 5/22 x 14 days (ending 6/4).  # Esophageal candidiasis: Risk factors include prolonged abx use and diabetes. Started fluconazole 200 mg PO daily x 14 days as per IDSA guideline dosing. HIV antibody and antigen were negative; HIV RNA not detected. Patient discharged on remainder of fluconazole course, 284m po daily x 14 days (5/22 - 6/3).  # Strongyloides antibody positive Treated with ivermectin 200 mcg/kg daily x 2 doses.  # Skin rash  # Tick bites # [facility] Unable to send ticks for arthropod study. Found to have positive RMSF titer 1:320. Ehrlichiosis negative. Lyme testing negative. Patient was treated with doxycycline x 10 days (5/19-5/28). Rash was treated successfully with topical hydrocortisone, Eucerin, and Benedryl PRN for itching.   # Diabetes mellitus, Type 2 Hemoglobin A1C=12.2% in 02/2017. Patient's insulin regimen changed multiple times. He had several episodes of hypoglycemia, despite normal eating patterns. Ultimately, patient was discharged on NPH 70/30 50 units before breakfast and 30 units before dinner. Patient will receive dietician and diabetes education home health services.   # Anisocoria No focal neurological deficits, mostly unremarkable neurological exam. A&Ox3. Pupils are asymmetric but respond to light in a similar fashion. EOMI. No ptosis. Therefore, this is most likely physiologic. No history  of falls or head trauma. Would consider head imaging if develops focal neurological deficits, however otherwise may be normal variant.       Patient Active Problem List   Diagnosis Date Noted  . Protein-calorie malnutrition, severe 04/09/2016  .  Cavitary lesion of lung 04/06/2016     Past Surgical History:  Procedure Laterality Date  . NO PAST SURGERIES       Prior to Admission medications   Medication Sig Start Date End Date Taking? Authorizing Provider  insulin NPH-regular Human (NOVOLIN 70/30) (70-30) 100 UNIT/ML injection Inject 70 Units into the skin 2 (two) times daily with a meal. Patient taking differently: Inject 50 Units into the skin 2 (two) times daily with a meal.  04/10/16  Yes Sudini, Alveta Heimlich, MD  metFORMIN (GLUCOPHAGE) 1000 MG tablet Take 1 tablet (1,000 mg total) by mouth 2 (two) times daily with a meal. Patient not taking: Reported on 04/17/2017 04/10/16   Hillary Bow, MD     Allergies Patient has no known allergies.   Family History  Problem Relation Age of Onset  . Diabetes Mother   . Diabetes Father     Social History Social History  Substance Use Topics  . Smoking status: Never Smoker  . Smokeless tobacco: Not on file  . Alcohol use No    Review of Systems  Constitutional:   No fever or chills.  ENT:   No sore throat. No rhinorrhea. Cardiovascular:   No chest pain or syncope. Respiratory:   No dyspnea or cough. Gastrointestinal:   Negative for abdominal pain, vomiting and diarrhea.  Musculoskeletal:  Left hip pain as above All other systems reviewed and are negative except as documented above in ROS and HPI.  ____________________________________________   PHYSICAL EXAM:  VITAL SIGNS: ED Triage Vitals  Enc Vitals Group     BP 04/17/17 1327 97/70     Pulse Rate 04/17/17 1327 88     Resp --      Temp 04/17/17 1327 98.1 F (36.7 C)     Temp Source 04/17/17 1327 Oral     SpO2 04/17/17 1327 100 %     Weight 04/17/17 1326 101 lb 6 oz (46 kg)     Height 04/17/17 1326 '5\' 8"'  (1.727 m)     Head Circumference --      Peak Flow --      Pain Score 04/17/17 1325 8     Pain Loc --      Pain Edu? --      Excl. in [place]? --     Vital signs reviewed, nursing assessments  reviewed.   Constitutional:   Alert and oriented. Well appearing and in no distress.emaciated Eyes:   No scleral icterus.  EOMI. No nystagmus. No conjunctival pallor. PERRL. ENT   Head:   Normocephalic and atraumatic.   Nose:   No congestion/rhinnorhea.    Mouth/Throat:   Dry mucous membranes, no pharyngeal erythema. No peritonsillar mass.    Neck:   No meningismus. Full ROM Hematological/Lymphatic/Immunilogical:   No cervical lymphadenopathy. Cardiovascular:   RRR. Symmetric bilateral radial and DP pulses.  No murmurs.  Respiratory:   Normal respiratory effort without tachypnea/retractions. Breath sounds are clear and equal bilaterally. No wheezes/rales/rhonchi. Gastrointestinal:   Soft with distended bladder, nontender.. Non distended. There is no CVA tenderness.  No rebound, rigidity, or guarding. Genitourinary:   Unremarkable Musculoskeletal:   Pain with left hip flexion. Pain on palpation of the left greater trochanter.  Neurologic:   Normal  speech and language.  Motor grossly intact. No gross focal neurologic deficits are appreciated.  Skin:    Skin is warm, dry and intact. No rash noted.  No petechiae, purpura, or bullae.  ____________________________________________    LABS (pertinent positives/negatives) (all labs ordered are listed, but only abnormal results are displayed) Labs Reviewed  URINALYSIS, COMPLETE (UACMP) WITH MICROSCOPIC - Abnormal; Notable for the following:       Result Value   Color, Urine STRAW (*)    APPearance CLEAR (*)    Specific Gravity, Urine 1.032 (*)    Glucose, UA >=500 (*)    Ketones, ur 5 (*)    All other components within normal limits  CBC WITH DIFFERENTIAL/PLATELET - Abnormal; Notable for the following:    RBC 3.53 (*)    Hemoglobin 11.9 (*)    HCT 33.5 (*)    All other components within normal limits  COMPREHENSIVE METABOLIC PANEL - Abnormal; Notable for the following:    Sodium 129 (*)    Chloride 94 (*)    Glucose, Bld  655 (*)    Calcium 8.5 (*)    Total Protein 6.4 (*)    All other components within normal limits  BLOOD GAS, VENOUS - Abnormal; Notable for the following:    pO2, Ven <31.0 (*)    Bicarbonate 28.7 (*)    Acid-Base Excess 2.3 (*)    All other components within normal limits  GLUCOSE, CAPILLARY - Abnormal; Notable for the following:    Glucose-Capillary 585 (*)    All other components within normal limits  GLUCOSE, CAPILLARY - Abnormal; Notable for the following:    Glucose-Capillary 400 (*)    All other components within normal limits  URINE CULTURE  CULTURE, BLOOD (ROUTINE X 2)  CULTURE, BLOOD (ROUTINE X 2)  LIPASE, BLOOD  ETHANOL  LACTIC ACID, PLASMA  LACTIC ACID, PLASMA  CBG MONITORING, ED   ____________________________________________   EKG    ____________________________________________    RADIOLOGY  Dg Chest 1 View  Result Date: 04/17/2017 CLINICAL DATA:  Hyperglycemia.  Weakness.  Weight loss. EXAM: CHEST 1 VIEW COMPARISON:  12/21/2016 FINDINGS: Mild hyperinflation. Midline trachea. Normal heart size. No pleural effusion or pneumothorax. Mild improvement in inferior right upper lobe airspace disease. IMPRESSION: Decreasing inferior right upper lobe airspace disease. Favor resolving infection and scar. No acute superimposed process. Electronically Signed   By: Abigail Miyamoto M.D.   On: 04/17/2017 14:59   Dg Hip Unilat W Or Wo Pelvis 2-3 Views Left  Result Date: 04/17/2017 CLINICAL DATA:  Fall.  Pain. EXAM: DG HIP (WITH OR WITHOUT PELVIS) 2-3V LEFT COMPARISON:  None FINDINGS: Femoral heads are located. Sacroiliac joints are symmetric. Irregularity, most consistent with fracture in the greater trochanteric region of the left femur. IMPRESSION: Greater trochanteric left femur fracture. Electronically Signed   By: Abigail Miyamoto M.D.   On: 04/17/2017 14:38     ____________________________________________   PROCEDURES Procedures  ____________________________________________   INITIAL IMPRESSION / ASSESSMENT AND PLAN / ED COURSE  Pertinent labs & imaging results that were available during my care of the patient were reviewed by me and considered in my medical decision making (see chart for details).  Patient presents with hyperglycemia of about 600, appears significantly dehydrated and orthostatic. We'll get x-ray of the left hip to evaluate for likely fracture. He has an overall very poor health, apparently due to severe malnutrition from chronic medication noncompliance with his insulin. Check labs for evidence of electrolyte abnormalities or acidosis.  Clinical Course as of Apr 17 2045  Nancy Fetter Apr 17, 2017  1728 D/w ortho dr. Sabra Heck. Non-op injury. Will control pain, attempt to ambulate.   [PS]  1929 Pt unable to bear weight or stand or take a step after pain medication. Will continue analgesics. Obtain CT hip to eval for occult fx. Plan to hospitalize for pain control/PT/OT..   [PS]    Clinical Course User Index [PS] Carrie Mew, MD     ____________________________________________   FINAL CLINICAL IMPRESSION(S) / ED DIAGNOSES  Final diagnoses:  Hyperglycemia  Dehydration  Closed fracture of left hip, initial encounter [facility])      New Prescriptions   No medications on file     Portions of this note were generated with dragon dictation software. Dictation errors may occur despite best attempts at proofreading.    Carrie Mew, MD 04/17/17 2046

## 2017-04-17 NOTE — ED Notes (Signed)
Dr Joni Fears aware of glucose 655

## 2017-04-17 NOTE — ED Notes (Signed)
Pt given meal tray, delay explained and apologized to patient. Pt states understanding at this time, denies any further needs at this time, patient's call bell within reach.. Will continue to monitor for further patient needs.

## 2017-04-17 NOTE — ED Notes (Signed)
PT given meal tray per request. Pt given pain medication. Explained to patient that in approx 30 minutes would attempt to stand and walk with walker after pain medication. Pt states understanding. Pt is alert and oriented at this time. NAD noted. VSS. Will continue to monitor for further patient needs.

## 2017-04-17 NOTE — ED Notes (Signed)
This RN and Larene Beach, RN attempted to ambulate patient with a walker, unsuccessful. MD at bedside and aware, upon getting patient back to BP initial BP 67/50, explained to patient could not give pain medication with BP that low, patient states understanding. Pt placed in trendelenburg, remains alert and oriented at this time. MD made aware, orders received for NS bolus.

## 2017-04-17 NOTE — ED Notes (Signed)
Admitting MD at bedside at this time.

## 2017-04-17 NOTE — ED Triage Notes (Signed)
Per ems, pt had syncopal episode early this morning, was found by family; has been unable to bear weight on L leg since fall; BS reading high on EMS machine; pt has lost significant amount of weight in last 2 months and was dx with gastroparesis recently

## 2017-04-17 NOTE — ED Notes (Signed)
Pt taken to CT at this time.

## 2017-04-17 NOTE — H&P (Signed)
[place] at [place] NAME: Gregory Moody    MR#:  [id number]  DATE OF BIRTH:  01-22-1976  DATE OF ADMISSION:  04/17/2017  PRIMARY CARE PHYSICIAN: Department, [facility]   REQUESTING/REFERRING PHYSICIAN: stafford  CHIEF COMPLAINT:   Chief Complaint  Patient presents with  . Hyperglycemia  . Loss of Consciousness    HISTORY OF PRESENT ILLNESS: Gregory Moody  is a 41 y.o. male with a known history of Diabetes, gastroparesis, tuberculosis- was going to get a glass of water daily 3 in the morning and felt very dizzy and passed out, fall down on the hard floor and since then he was hurting in his left hip. He waited for a few hours and did not take any insulin this morning, but continue to have severe pain in his left hip every time he tried to get up and so came to emergency room finally. His blood sugar was noted more than 600 and responded to IV fluid he was also hypotensive which was responding to IV fluid boluses. On x-ray of his pelvis is noted there is a fracture on the left hip and CT scan confirmed the fracture so he is given to hospitalist team for further management of his blood sugar and fracture.  PAST MEDICAL HISTORY:   Past Medical History:  Diagnosis Date  . Diabetes mellitus without complication ([place])   . Gastroparesis   . Tuberculosis     PAST SURGICAL HISTORY: Past Surgical History:  Procedure Laterality Date  . NO PAST SURGERIES      SOCIAL HISTORY:  Social History  Substance Use Topics  . Smoking status: Never Smoker  . Smokeless tobacco: Not on file  . Alcohol use No    FAMILY HISTORY:  Family History  Problem Relation Age of Onset  . Diabetes Mother   . Diabetes Father     DRUG ALLERGIES: No Known Allergies  REVIEW OF SYSTEMS:   CONSTITUTIONAL: No fever, fatigue or weakness.  EYES: No blurred or double vision.  EARS, NOSE, AND THROAT: No tinnitus or ear pain.  RESPIRATORY: No  cough, shortness of breath, wheezing or hemoptysis.  CARDIOVASCULAR: No chest pain, orthopnea, edema.  GASTROINTESTINAL: No nausea, vomiting, diarrhea or abdominal pain.  GENITOURINARY: No dysuria, hematuria.  ENDOCRINE: No polyuria, nocturia,  HEMATOLOGY: No anemia, easy bruising or bleeding SKIN: No rash or lesion. MUSCULOSKELETAL: left hip joint pain or arthritis.   NEUROLOGIC: No tingling, numbness, weakness.  PSYCHIATRY: No anxiety or depression.   MEDICATIONS AT HOME:  Prior to Admission medications   Medication Sig Start Date End Date Taking? Authorizing Provider  insulin NPH-regular Human (NOVOLIN 70/30) (70-30) 100 UNIT/ML injection Inject 70 Units into the skin 2 (two) times daily with a meal. Patient taking differently: Inject 50 Units into the skin 2 (two) times daily with a meal.  04/10/16  Yes Sudini, Alveta Heimlich, MD  metFORMIN (GLUCOPHAGE) 1000 MG tablet Take 1 tablet (1,000 mg total) by mouth 2 (two) times daily with a meal. Patient not taking: Reported on 04/17/2017 04/10/16   Hillary Bow, MD      PHYSICAL EXAMINATION:   VITAL SIGNS: Blood pressure 98/64, pulse 71, temperature 98.1 F (36.7 C), temperature source Oral, resp. rate 12, height 5\' 8"  (1.727 m), weight 46 kg (101 lb 6 oz), SpO2 100 %.  GENERAL:  41 y.o.-year-old patient malnourished, lying in the bed with no acute distress.  EYES: Pupils equal, round, reactive to light and accommodation. No scleral  icterus. Extraocular muscles intact.  HEENT: Head atraumatic, normocephalic. Oropharynx and nasopharynx clear.  NECK:  Supple, no jugular venous distention. No thyroid enlargement, no tenderness.  LUNGS: Normal breath sounds bilaterally, no wheezing, rales,rhonchi or crepitation. No use of accessory muscles of respiration.  CARDIOVASCULAR: S1, S2 normal. No murmurs, rubs, or gallops.  ABDOMEN: Soft, nontender, nondistended. Bowel sounds present. No organomegaly or mass.  EXTREMITIES: No pedal edema, cyanosis, or  clubbing. Left hip is tender on local pressure. NEUROLOGIC: Cranial nerves II through XII are intact. Muscle strength 5/5 in all extremities, except with movement of left lower extremity is restricted due to pain in the hip. Sensation intact. Gait not checked.  PSYCHIATRIC: The patient is alert and oriented x 3.  SKIN: No obvious rash, lesion, or ulcer.   LABORATORY PANEL:   CBC  Recent Labs Lab 04/17/17 1334  WBC 7.3  HGB 11.9*  HCT 33.5*  PLT 233  MCV 94.9  MCH 33.9  MCHC 35.7  RDW 12.2  LYMPHSABS 1.8  MONOABS 0.4  EOSABS 0.0  BASOSABS 0.0   ------------------------------------------------------------------------------------------------------------------  Chemistries   Recent Labs Lab 04/17/17 1334  NA 129*  K 3.8  CL 94*  CO2 25  GLUCOSE 655*  BUN 14  CREATININE 0.64  CALCIUM 8.5*  AST 24  ALT 34  ALKPHOS 85  BILITOT 1.0   ------------------------------------------------------------------------------------------------------------------ estimated creatinine clearance is 78.3 mL/min (by C-G formula based on SCr of 0.64 mg/dL). ------------------------------------------------------------------------------------------------------------------ No results for input(s): TSH, T4TOTAL, T3FREE, THYROIDAB in the last 72 hours.  Invalid input(s): FREET3   Coagulation profile No results for input(s): INR, PROTIME in the last 168 hours. ------------------------------------------------------------------------------------------------------------------- No results for input(s): DDIMER in the last 72 hours. -------------------------------------------------------------------------------------------------------------------  Cardiac Enzymes No results for input(s): CKMB, TROPONINI, MYOGLOBIN in the last 168 hours.  Invalid input(s): CK ------------------------------------------------------------------------------------------------------------------ Invalid input(s):  POCBNP  ---------------------------------------------------------------------------------------------------------------  Urinalysis    Component Value Date/Time   COLORURINE STRAW (A) 04/17/2017 1326   APPEARANCEUR CLEAR (A) 04/17/2017 1326   LABSPEC 1.032 (H) 04/17/2017 1326   PHURINE 6.0 04/17/2017 1326   GLUCOSEU >=500 (A) 04/17/2017 1326   HGBUR NEGATIVE 04/17/2017 1326   BILIRUBINUR NEGATIVE 04/17/2017 1326   KETONESUR 5 (A) 04/17/2017 1326   PROTEINUR NEGATIVE 04/17/2017 1326   NITRITE NEGATIVE 04/17/2017 1326   LEUKOCYTESUR NEGATIVE 04/17/2017 1326     RADIOLOGY: Dg Chest 1 View  Result Date: 04/17/2017 CLINICAL DATA:  Hyperglycemia.  Weakness.  Weight loss. EXAM: CHEST 1 VIEW COMPARISON:  12/21/2016 FINDINGS: Mild hyperinflation. Midline trachea. Normal heart size. No pleural effusion or pneumothorax. Mild improvement in inferior right upper lobe airspace disease. IMPRESSION: Decreasing inferior right upper lobe airspace disease. Favor resolving infection and scar. No acute superimposed process. Electronically Signed   By: Abigail Miyamoto M.D.   On: 04/17/2017 14:59   Ct Hip Left Wo Contrast  Result Date: 04/17/2017 CLINICAL DATA:  Syncopal episode unable to bear weight on the left leg EXAM: CT OF THE LEFT HIP WITHOUT CONTRAST TECHNIQUE: Multidetector CT imaging of the left hip was performed according to the standard protocol. Multiplanar CT image reconstructions were also generated. COMPARISON:  Radiograph 04/17/2017 FINDINGS: Bones/Joint/Cartilage Comminuted fracture involving the greater trochanter of the proximal left femur with mild separation of fracture fragments superolaterally. Fracture lucency extends to the posterior, medial cortex. 14 mm calcification superior to the left femoral neck may represent nonspecific soft tissue calcification as opposed to displaced bone fragment. No definite fracture lucency within the lesser trochanter or femoral neck. No  dislocation evident.  Pubic symphysis is intact. The rami show no fracture. No evidence for acetabular fracture. Ligaments Suboptimally assessed by CT. Muscles and Tendons Normal muscle bulk. Edema and probable intramuscular hematoma of the proximal, lateral thigh muscles and anterior hip muscles. Soft tissues Edema within the soft tissues. Vascular calcifications in the left groin. Moderate feces retention in the rectum. IMPRESSION: Comminuted fracture involving the greater trochanter of the left femur with mild separation of fracture fragments superior, laterally. No dislocation of the femoral head. Electronically Signed   By: Donavan Foil M.D.   On: 04/17/2017 20:59   Dg Hip Unilat W Or Wo Pelvis 2-3 Views Left  Result Date: 04/17/2017 CLINICAL DATA:  Fall.  Pain. EXAM: DG HIP (WITH OR WITHOUT PELVIS) 2-3V LEFT COMPARISON:  None FINDINGS: Femoral heads are located. Sacroiliac joints are symmetric. Irregularity, most consistent with fracture in the greater trochanteric region of the left femur. IMPRESSION: Greater trochanteric left femur fracture. Electronically Signed   By: Abigail Miyamoto M.D.   On: 04/17/2017 14:38    EKG: No orders found for this or any previous visit.  IMPRESSION AND PLAN:  * Nonketotic hyperglycemia   Resume his 70/30 insulin, IV fluid boluses were given and blood sugar is coming under control, continue IV fluid.   Keep on sliding scale coverage.  * Left hip fracture   Orthopedic consult.   Morphine and Percocet for pain management.   His calcium level is low, I will check phosphorus and PTH level.  * Hyponatremia   This is secondary to hyperglycemia, continue monitoring with IV fluids.    All the records are reviewed and case discussed with ED provider. Management plans discussed with the patient, family and they are in agreement.  CODE STATUS: full code Code Status History    Date Active Date Inactive Code Status Order ID Comments User Context   04/06/2016  7:07 PM 04/10/2016  7:28 PM  Full Code [id number]  Loletha Grayer, MD ED       TOTAL TIME TAKING CARE OF THIS PATIENT: 50 minutes.    [name] M.D on 04/17/2017   Between 7am to 6pm - Pager - [phone]  After 6pm go to [email or web address] - password [email or web address] [place] [facility]  Office  [phone]  CC: Primary care physician; Department, [facility]   Note: This dictation was prepared with Dragon dictation along with smaller phrase technology. Any transcriptional errors that result from this process are unintentional.

## 2017-04-17 NOTE — ED Notes (Signed)
No change in patient condition. Pt continues to rest in bed with NAD noted. Will continue to monitor for further patient needs at this time.

## 2017-04-17 NOTE — ED Notes (Signed)
No change in patient condition. Pt awakens with mild stimuli at this time. Explained to patient that he was given insulin due to his continued high CBG, pt states understanding. Will continue to monitor for further patient needs.

## 2017-04-18 ENCOUNTER — Encounter: Payer: Self-pay | Admitting: General Practice

## 2017-04-18 LAB — BASIC METABOLIC PANEL
Anion gap: 5 (ref 5–15)
BUN: 10 mg/dL (ref 6–20)
CALCIUM: 8.5 mg/dL — AB (ref 8.9–10.3)
CO2: 26 mmol/L (ref 22–32)
Chloride: 103 mmol/L (ref 101–111)
Creatinine, Ser: 0.41 mg/dL — ABNORMAL LOW (ref 0.61–1.24)
GFR calc Af Amer: >60 mL/min (ref >60)
GLUCOSE: 447 mg/dL — AB (ref 65–99)
Potassium: 3.7 mmol/L (ref 3.5–5.1)
Sodium: 134 mmol/L — ABNORMAL LOW (ref 135–145)

## 2017-04-18 LAB — URINE CULTURE: CULTURE: NO GROWTH

## 2017-04-18 LAB — CBC
HCT: 29.6 % — ABNORMAL LOW (ref 40.0–52.0)
Hemoglobin: 10.9 g/dL — ABNORMAL LOW (ref 13.0–18.0)
MCH: 34.1 pg — AB (ref 26.0–34.0)
MCHC: 36.7 g/dL — AB (ref 32.0–36.0)
MCV: 92.9 fL (ref 80.0–100.0)
Platelets: 191 10*3/uL (ref 150–440)
RBC: 3.19 MIL/uL — ABNORMAL LOW (ref 4.40–5.90)
RDW: 12.1 % (ref 11.5–14.5)
WBC: 4.6 10*3/uL (ref 3.8–10.6)

## 2017-04-18 LAB — GLUCOSE, CAPILLARY
GLUCOSE-CAPILLARY: 245 mg/dL — AB (ref 65–99)
GLUCOSE-CAPILLARY: 195 mg/dL — AB (ref 65–99)
Glucose-Capillary: 486 mg/dL — ABNORMAL HIGH (ref 65–99)
Glucose-Capillary: 354 mg/dL — ABNORMAL HIGH (ref 65–99)
Glucose-Capillary: 200 mg/dL — ABNORMAL HIGH (ref 65–99)

## 2017-04-18 MED ORDER — ENSURE ENLIVE PO LIQD
237.0000 mL | Freq: Two times a day (BID) | ORAL | Status: DC
  Administered 2017-04-18 – 2017-04-19 (×2): 237 mL via ORAL

## 2017-04-18 MED ORDER — INSULIN ASPART 100 UNIT/ML ~~LOC~~ SOLN
0.0000 [IU] | Freq: Three times a day (TID) | SUBCUTANEOUS | Status: DC
  Administered 2017-04-18: 3 [IU] via SUBCUTANEOUS
  Administered 2017-04-18: 2 [IU] via SUBCUTANEOUS
  Administered 2017-04-19: 3 [IU] via SUBCUTANEOUS
  Filled 2017-04-18 (×3): qty 1

## 2017-04-18 MED ORDER — INSULIN DETEMIR 100 UNIT/ML ~~LOC~~ SOLN
30.0000 [IU] | Freq: Two times a day (BID) | SUBCUTANEOUS | Status: DC
  Administered 2017-04-18 – 2017-04-19 (×3): 30 [IU] via SUBCUTANEOUS
  Filled 2017-04-18 (×5): qty 0.3

## 2017-04-18 MED ORDER — INSULIN ASPART 100 UNIT/ML ~~LOC~~ SOLN: 10.0000 [IU] | Freq: Three times a day (TID) | SUBCUTANEOUS | Status: DC

## 2017-04-18 MED ORDER — INSULIN ASPART 100 UNIT/ML ~~LOC~~ SOLN
10.0000 [IU] | Freq: Three times a day (TID) | SUBCUTANEOUS | Status: DC
  Administered 2017-04-18 – 2017-04-19 (×3): 10 [IU] via SUBCUTANEOUS
  Filled 2017-04-18 (×2): qty 1

## 2017-04-18 MED ORDER — ADULT MULTIVITAMIN W/MINERALS CH
1.0000 | ORAL_TABLET | Freq: Every day | ORAL | Status: DC
  Administered 2017-04-18 – 2017-04-19 (×2): 1 via ORAL
  Filled 2017-04-18 (×2): qty 1

## 2017-04-18 MED ORDER — ENOXAPARIN SODIUM 40 MG/0.4ML ~~LOC~~ SOLN
40.0000 mg | SUBCUTANEOUS | Status: DC
  Administered 2017-04-18: 40 mg via SUBCUTANEOUS
  Filled 2017-04-18: qty 0.4

## 2017-04-18 NOTE — Progress Notes (Signed)
Initial Nutrition Assessment  DOCUMENTATION CODES:   Severe malnutrition in context of chronic illness, Underweight  INTERVENTION:  Provide Ensure Enlive po BID with meals, each supplement provides 350 kcal and 20 grams of protein.   Provide snacks po TID between meals.   Reviewed "Tips for Increasing Calories and Protein" handout in Spanish from the Academy of Nutrition and Dietetics with patient. Encouraged him to eat small, frequent meals (6-8 per day) of foods that are calorie- and protein-dense.  Provide daily multivitamin with minerals.  NUTRITION DIAGNOSIS:   Malnutrition (Severe) related to chronic illness (tuberculosis) as evidenced by severe depletion of body fat, severe depletion of muscle mass.  GOAL:   Patient will meet greater than or equal to 90% of their needs  MONITOR:   PO intake, Supplement acceptance, Labs, Weight trends, I & O's  REASON FOR ASSESSMENT:   Other (Comment) (Low BMI)    ASSESSMENT:   41 year old male with PMHx of DM type 2, gastroparesis, tuberculosis who presented after mechanical fall at home found to have greater trochanter fracture.   Spoke with patient at bedside with [place] interpreter. Patient reports his appetite is good. He reports that he eats three meals per day plus snacks between meals. For breakfast he eats eggs, bacon, and toast. For lunch and dinner he may eat meat with rice or beans. He likes to snack on Cheetos and cookies. Patient reports he does not have an oral nutrition supplement at home that he drinks, but he enjoys Ensure. He has tried Glucerna in the past but did not tolerate it well as it worsened his diarrhea. Patient reports he has had chronic diarrhea for approximately one year now. He reports it is foul-smelling, floats in toilet, and he can occasionally see oil droplets in toilet (all can be signs of fat malabsorption).   Patient reports his UBW was 140-150 lbs. Over a very short time span a little over one year  ago, patient reports he lost approximately 50 lbs (33% body weight). He reports he has remained weight stable the past year.  Meal Completion: 100% of breakfast this morning per chart  Medications reviewed and include: Novolog 0-9 units TID, Novolog 10 units TID, Levemir 30 units BID, senna.  Labs reviewed: CBG 245-585, Sodium 134, Creatinine 0.41.   Nutrition-Focused physical exam completed. Findings are severe fat depletion, severe muscle depletion, and no edema.   Discussed with RN.  Diet Order:  Diet Carb Modified Fluid consistency: Thin; Room service appropriate? Yes  Skin:  Reviewed, no issues  Last BM:  04/18/2017 - type 4  Height:   Ht Readings from Last 1 Encounters:  04/17/17 5\' 8"  (1.727 m)    Weight:   Wt Readings from Last 1 Encounters:  04/17/17 97 lb 6.4 oz (44.2 kg)    Ideal Body Weight:  70 kg  BMI:  Body mass index is 14.81 kg/m.  Estimated Nutritional Needs:   Kcal:  1715-1980 (MSJ x 1.3-1.5)  Protein:  66-80 grams (1.5-1.8 grams/kg)  Fluid:  1.3-1.5 L/day (30-35 ml/kg)  EDUCATION NEEDS:   Education needs addressed  Willey Blade, MS, RD, LDN Pager: [phone] After Hours Pager: [phone]

## 2017-04-18 NOTE — Care Management Note (Signed)
Case Management Note  Patient Details  Name: Gregory Moody MRN: [id number] Date of Birth: May 01, 1976  Subjective/Objective:  RNCM assessment for discharge planning. Medical record indicates patient lives alone. He is a patient at the [facility]. HX: TB.  Admitted following a fall with left hip fracture and hyperglycemia. Uninsured.                   Action/Plan: Will follow progression and assist as needed.    Expected Discharge Date:  04/19/17               Expected Discharge Plan:     In-House Referral:     Discharge planning Services  CM Consult, [facility], Medication Assistance  Post Acute Care Choice:    Choice offered to:     DME Arranged:    DME Agency:     HH Arranged:    HH Agency:     Status of Service:  In process, will continue to follow  If discussed at Long Length of Stay Meetings, dates discussed:    Additional Comments:  Gregory Mango, RN 04/18/2017, 8:26 AM

## 2017-04-18 NOTE — Progress Notes (Signed)
[place] at [place] NAME: Gregory Moody    MR#:  [id number]  DATE OF BIRTH:  Oct 24, 1975  SUBJECTIVE:  Came in after a mechanical fall at home. Complained of left hip pain found to have greater trochanter fracture. Sugars uncontrolled.  REVIEW OF SYSTEMS:   Review of Systems  Constitutional: Negative for chills, fever and weight loss.  HENT: Negative for ear discharge, ear pain and nosebleeds.   Eyes: Negative for blurred vision, pain and discharge.  Respiratory: Negative for sputum production, shortness of breath, wheezing and stridor.   Cardiovascular: Negative for chest pain, palpitations, orthopnea and PND.  Gastrointestinal: Negative for abdominal pain, diarrhea, nausea and vomiting.  Genitourinary: Negative for frequency and urgency.  Musculoskeletal: Positive for joint pain. Negative for back pain.  Neurological: Positive for weakness. Negative for sensory change, speech change and focal weakness.  Psychiatric/Behavioral: Negative for depression and hallucinations. The patient is not nervous/anxious.    Tolerating Diet: Tolerating PT:   DRUG ALLERGIES:  No Known Allergies  VITALS:  Blood pressure 122/87, pulse 74, temperature 98.4 F (36.9 C), temperature source Oral, resp. rate 20, height 5\' 8"  (1.727 m), weight 44.2 kg (97 lb 6.4 oz), SpO2 100 %.  PHYSICAL EXAMINATION:   Physical Exam  GENERAL:  41 y.o.-year-old patient lying in the bed with no acute distress.  EYES: Pupils equal, round, reactive to light and accommodation. No scleral icterus. Extraocular muscles intact.  HEENT: Head atraumatic, normocephalic. Oropharynx and nasopharynx clear.  NECK:  Supple, no jugular venous distention. No thyroid enlargement, no tenderness.  LUNGS: Normal breath sounds bilaterally, no wheezing, rales, rhonchi. No use of accessory muscles of respiration.  CARDIOVASCULAR: S1, S2 normal. No murmurs, rubs, or gallops.   ABDOMEN: Soft, nontender, nondistended. Bowel sounds present. No organomegaly or mass.  EXTREMITIES: No cyanosis, clubbing or edema b/l.   Left hip restricted range of motion NEUROLOGIC: Cranial nerves II through XII are intact. No focal Motor or sensory deficits b/l.   PSYCHIATRIC:  patient is alert and oriented x 3.  SKIN: No obvious rash, lesion, or ulcer.   LABORATORY PANEL:  CBC  Recent Labs Lab 04/18/17 0437  WBC 4.6  HGB 10.9*  HCT 29.6*  PLT 191    Chemistries   Recent Labs Lab 04/17/17 1334 04/18/17 0437  NA 129* 134*  K 3.8 3.7  CL 94* 103  CO2 25 26  GLUCOSE 655* 447*  BUN 14 10  CREATININE 0.64 0.41*  CALCIUM 8.5* 8.5*  AST 24  --   ALT 34  --   ALKPHOS 85  --   BILITOT 1.0  --    Cardiac Enzymes No results for input(s): TROPONINI in the last 168 hours. RADIOLOGY:  Dg Chest 1 View  Result Date: 04/17/2017 CLINICAL DATA:  Hyperglycemia.  Weakness.  Weight loss. EXAM: CHEST 1 VIEW COMPARISON:  12/21/2016 FINDINGS: Mild hyperinflation. Midline trachea. Normal heart size. No pleural effusion or pneumothorax. Mild improvement in inferior right upper lobe airspace disease. IMPRESSION: Decreasing inferior right upper lobe airspace disease. Favor resolving infection and scar. No acute superimposed process. Electronically Signed   By: Abigail Miyamoto M.D.   On: 04/17/2017 14:59   Ct Hip Left Wo Contrast  Result Date: 04/17/2017 CLINICAL DATA:  Syncopal episode unable to bear weight on the left leg EXAM: CT OF THE LEFT HIP WITHOUT CONTRAST TECHNIQUE: Multidetector CT imaging of the left hip was performed according to the standard protocol. Multiplanar CT image reconstructions  were also generated. COMPARISON:  Radiograph 04/17/2017 FINDINGS: Bones/Joint/Cartilage Comminuted fracture involving the greater trochanter of the proximal left femur with mild separation of fracture fragments superolaterally. Fracture lucency extends to the posterior, medial cortex. 14 mm  calcification superior to the left femoral neck may represent nonspecific soft tissue calcification as opposed to displaced bone fragment. No definite fracture lucency within the lesser trochanter or femoral neck. No dislocation evident. Pubic symphysis is intact. The rami show no fracture. No evidence for acetabular fracture. Ligaments Suboptimally assessed by CT. Muscles and Tendons Normal muscle bulk. Edema and probable intramuscular hematoma of the proximal, lateral thigh muscles and anterior hip muscles. Soft tissues Edema within the soft tissues. Vascular calcifications in the left groin. Moderate feces retention in the rectum. IMPRESSION: Comminuted fracture involving the greater trochanter of the left femur with mild separation of fracture fragments superior, laterally. No dislocation of the femoral head. Electronically Signed   By: Donavan Foil M.D.   On: 04/17/2017 20:59   Dg Hip Unilat W Or Wo Pelvis 2-3 Views Left  Result Date: 04/17/2017 CLINICAL DATA:  Fall.  Pain. EXAM: DG HIP (WITH OR WITHOUT PELVIS) 2-3V LEFT COMPARISON:  None FINDINGS: Femoral heads are located. Sacroiliac joints are symmetric. Irregularity, most consistent with fracture in the greater trochanteric region of the left femur. IMPRESSION: Greater trochanteric left femur fracture. Electronically Signed   By: Abigail Miyamoto M.D.   On: 04/17/2017 14:38   ASSESSMENT AND PLAN:  Gregory Moody  is a 41 y.o. male with a known history of Diabetes, gastroparesis, tuberculosis- was going to get a glass of water daily 3 in the morning and felt very dizzy and passed out, fall down on the hard floor and since then he was hurting in his left hip   * Nonketotic hyperglycemia   -sugars uncontrolled. Appears patient has issues with Dr. Compliance. -Per diabetes coordinator changed to Levemir 30 units twice a day, NovoLog 10 units 3 times a day and sliding scale -Dietitian to see  * Left greater trochanter fracture   Orthopedic  consult-Dr. Sabra Heck Recommends conservative management with physical therapy and when necessary pain meds   Morphine and Percocet for pain management. Physical therapy to be initiated  * Hyponatremia   This is secondary to hyperglycemia, continue monitoring with IV fluids.  *DVT prophylaxis subcutaneous Lovenox     Case discussed with Care Management/Social Worker. Management plans discussed with the patient, family and they are in agreement.  CODE STATUS: Full  DVT Prophylaxis: Lovenox  TOTAL TIME TAKING CARE OF THIS PATIENT: 30 minutes.  >50% time spent on counselling and coordination of care  POSSIBLE D/C IN 1-2 DAYS, DEPENDING ON CLINICAL CONDITION.  Note: This dictation was prepared with Dragon dictation along with smaller phrase technology. Any transcriptional errors that result from this process are unintentional.  Cailan General M.D on 04/18/2017 at 12:56 PM  Between 7am to 6pm - Pager - [phone]  After 6pm go to [email or web address] - password [email or web address] [facility]  Office  [phone]  CC: Primary care physician; Department, [facility]

## 2017-04-18 NOTE — Progress Notes (Addendum)
Inpatient Diabetes Program Recommendations  AACE/ADA: New Consensus Statement on Inpatient Glycemic Control (2015)  Target Ranges:  Prepandial:   less than 140 mg/dL      Peak postprandial:   less than 180 mg/dL (1-2 hours)      Critically ill patients:  140 - 180 mg/dL   Lab Results  Component Value Date   GLUCAP 354 (H) 04/18/2017   HGBA1C 16.5 (H) 04/08/2016    Review of Glycemic Control  Results for PLUMMER, MATICH (MRN [id number]) as of 04/18/2017 10:50  Ref. Range 04/10/2016 11:41 04/17/2017 13:50 04/17/2017 16:59 04/17/2017 22:28 04/18/2017 07:58  Glucose-Capillary Latest Ref Range: 65 - 99 mg/dL 285 (H) 585 (HH) 400 (H) 486 (H) 354 (H)    Diabetes history: Type 2 x 10 years (over 200lbs at diagnosis) Outpatient Diabetes medications: Novolin 70/30 50 units bid- has not been taking it for months Current orders for Inpatient glycemic control: Novolog 70/30 40 units bid  Inpatient Diabetes Program Recommendations:  In anticipation that he may be NPO and to improve blood sugars, consider changing 70/30 insulin to Levemir 30 units bid, Novolog 10 units tid with meals (hold if he eats less than 50%) and add Novolog sensitive correction scale tid, Novolog 0-5 units qhs.   Spoke to patient with interpreter Ronnald Collum.  Discussed his recent A1C of 16.5%- he was not aware of this test or the significance of it. Discussed the risk of having high blood sugars on long term health.  Patient has not been taking any insulin for a couple of months- he reports he does not have work and cannot afford medications. Tells [place] he has been going to [facility] but stopped going because he couldn't afford it.  Ran out of insulin 2-3 months ago.  Discussed with Dr. Posey Pronto.   Gentry Fitz, RN, BA, MHA, CDE Diabetes Coordinator Inpatient Diabetes Program  [phone] (Team Pager) [phone] ([place]) 04/18/2017 10:59 AM

## 2017-04-18 NOTE — Evaluation (Signed)
Physical Therapy Evaluation Patient Details Name: Gregory Moody MRN: [id number] DOB: July 22, 1976 Today's Date: 04/18/2017   History of Present Illness  Pt is a 41 y/o M who presented s/p fall with L hip pain.  Pt found to have L greater trochanter fx.  Orthopedist consulted recommending conservative management.  Pt's PMH includes tuberculosis, diabetes, gastroparesis.    Clinical Impression  Pt admitted with above diagnosis. Pt currently with functional limitations due to the deficits listed below (see PT Problem List). Pt presents with L hip pain and significant guarded posture with mobility.  He requires encouragement to ambulate to hallway with RW and close min guard assist.  He requires min assist for bed mobility.  Pt will have assist 24/7 from family at d/c.  Pt will benefit from skilled PT to increase their independence and safety with mobility to allow discharge to the venue listed below.      Follow Up Recommendations Outpatient PT;Supervision for mobility/OOB    Equipment Recommendations  Rolling walker with 5" wheels    Recommendations for Other Services OT consult     Precautions / Restrictions Precautions Precautions: Fall Restrictions Weight Bearing Restrictions: Yes LLE Weight Bearing: Weight bearing as tolerated      Mobility  Bed Mobility Overal bed mobility: Needs Assistance Bed Mobility: Supine to Sit     Supine to sit: Min assist;HOB elevated     General bed mobility comments: Pt uses bed rail and requires min assist to push up to sitting.  Cues for sequencing and pt requires max increased time.    Transfers Overall transfer level: Needs assistance Equipment used: Rolling walker (2 wheeled) Transfers: Sit to/from Stand Sit to Stand: Min guard         General transfer comment: Close min guard assist as pt rises into standing with significantly flexed posture despite verbal cues to push up tall.  Cues for hand placement with sit<>stand.  Pt  controls descent to sit.  Ambulation/Gait Ambulation/Gait assistance: Min guard Ambulation Distance (Feet): 15 Feet Assistive device: Rolling walker (2 wheeled) Gait Pattern/deviations: Step-to pattern;Decreased stride length;Decreased stance time - left;Decreased step length - right;Decreased weight shift to left;Trunk flexed;Antalgic Gait velocity: decreased Gait velocity interpretation: <1.8 ft/sec, indicative of risk for recurrent falls General Gait Details: Decreased gait speed and pt demonstrates flexed posture despite cues for upright.  Pt requires motivation to ambulate more than 10 ft.    Stairs            Wheelchair Mobility    Modified Rankin (Stroke Patients Only)       Balance Overall balance assessment: Needs assistance Sitting-balance support: No upper extremity supported;Feet supported Sitting balance-Leahy Scale: Good     Standing balance support: Bilateral upper extremity supported;During functional activity Standing balance-Leahy Scale: Poor Standing balance comment: Relies on BUE support for static and dynamic activities                             Pertinent Vitals/Pain Pain Assessment: Faces Faces Pain Scale: Hurts whole lot Pain Location: L hip Pain Descriptors / Indicators: Aching;Discomfort;Guarding Pain Intervention(s): Limited activity within patient's tolerance;Monitored during session;Repositioned;Premedicated before session    Home Living Family/patient expects to be discharged to:: Private residence Living Arrangements: Parent (mother and father) Available Help at Discharge: Family;Available 24 hours/day Type of Home: Mobile home Home Access: Stairs to enter Entrance Stairs-Rails: Chemical engineer of Steps: 5 Home Layout: One level Home Equipment: Cane - single point;Crutches  Prior Function Level of Independence: Independent               Hand Dominance   Dominant Hand: Right     Extremity/Trunk Assessment   Upper Extremity Assessment Upper Extremity Assessment: RUE deficits/detail;LUE deficits/detail RUE Deficits / Details: Strength grossly WFL; however pt presents with generalized atrophy/possible malnourishment BUEs and BLEs LUE Deficits / Details: Strength grossly WFL; however pt presents with generalized atrophy/possible malnourishment BUEs and BLEs    Lower Extremity Assessment Lower Extremity Assessment: RLE deficits/detail;LLE deficits/detail RLE Deficits / Details: Strength grossly WFL; however pt presents with generalized atrophy/possible malnourishment BUEs and BLEs LLE Deficits / Details: Strength grossly WFL; however pt presents with generalized atrophy/possible malnourishment BUEs and BLEs.  Pt able to perform SLR but not able to hold against resistance.  Functionally, pt moves very cautiously. LLE: Unable to fully assess due to pain       Communication   Communication: Prefers language other than English  Cognition Arousal/Alertness: Awake/alert Behavior During Therapy: WFL for tasks assessed/performed Overall Cognitive Status: Within Functional Limits for tasks assessed                                        General Comments General comments (skin integrity, edema, etc.): Spanish Interpreter utilized: [employer] Exercises - Lower Extremity Straight Leg Raises: AAROM;Both;10 reps;Supine;AROM   Assessment/Plan    PT Assessment Patient needs continued PT services  PT Problem List Decreased strength;Decreased range of motion;Decreased activity tolerance;Decreased balance;Decreased mobility;Pain;Decreased knowledge of use of DME;Decreased safety awareness       PT Treatment Interventions DME instruction;Gait training;Stair training;Functional mobility training;Therapeutic activities;Therapeutic exercise;Balance training;Neuromuscular re-education;Patient/family education;Modalities    PT Goals  (Current goals can be found in the Care Plan section)  Acute Rehab PT Goals Patient Stated Goal: decreased pain PT Goal Formulation: With patient Time For Goal Achievement: 05/02/17 Potential to Achieve Goals: Good    Frequency 7X/week   Barriers to discharge        Co-evaluation               AM-PAC PT "6 Clicks" Daily Activity  Outcome Measure Difficulty turning over in bed (including adjusting bedclothes, sheets and blankets)?: Total Difficulty moving from lying on back to sitting on the side of the bed? : Total Difficulty sitting down on and standing up from a chair with arms (e.g., wheelchair, bedside commode, etc,.)?: A Little Help needed moving to and from a bed to chair (including a wheelchair)?: A Little Help needed walking in hospital room?: A Little Help needed climbing 3-5 steps with a railing? : A Little 6 Click Score: 14    End of Session Equipment Utilized During Treatment: Gait belt Activity Tolerance: Patient limited by pain Patient left: in chair;with call bell/phone within reach;with chair alarm set Nurse Communication: Mobility status;Patient requests pain meds;Other (comment) (pt requesting to eat) PT Visit Diagnosis: Pain;Difficulty in walking, not elsewhere classified (R26.2);Other abnormalities of gait and mobility (R26.89);Unsteadiness on feet (R26.81) Pain - Right/Left: Left Pain - part of body: Hip    Time: 7124-5809 PT Time Calculation (min) (ACUTE ONLY): 44 min   Charges:   PT Evaluation $PT Eval Low Complexity: 1 Procedure PT Treatments $Gait Training: 8-22 mins $Therapeutic Activity: 8-22 mins   PT G Codes:        Collie Siad PT, DPT 04/18/2017, 4:21 PM

## 2017-04-18 NOTE — Consult Note (Signed)
ORTHOPAEDIC CONSULTATION  REQUESTING PHYSICIAN: Fritzi Mandes, MD  Chief Complaint: Left hip pain  HPI: Gregory Moody is a 41 y.o. male who complains of  left hip pain following a fall at night, getting up to get water.  He was seen in the emergency room yesterday where exam and x-rays revealed a fracture of the greater trochanter of the left hip.  A CT scan confirmed this.  There was no evidence of intertrochanteric damage.  Patient has been admitted for hyperglycemia out of control and pain control.  He is starting PT this afternoon.  Past Medical History:  Diagnosis Date  . Diabetes mellitus without complication ([place])   . Gastroparesis   . Tuberculosis    Past Surgical History:  Procedure Laterality Date  . NO PAST SURGERIES     Social History   Social History  . Marital status: Single    Spouse name: N/A  . Number of children: N/A  . Years of education: N/A   Social History Main Topics  . Smoking status: Never Smoker  . Smokeless tobacco: None  . Alcohol use No  . Drug use: No  . Sexual activity: Not Asked   Other Topics Concern  . None   Social History Narrative  . None   Family History  Problem Relation Age of Onset  . Diabetes Mother   . Diabetes Father    No Known Allergies Prior to Admission medications   Medication Sig Start Date End Date Taking? Authorizing Provider  insulin NPH-regular Human (NOVOLIN 70/30) (70-30) 100 UNIT/ML injection Inject 70 Units into the skin 2 (two) times daily with a meal. Patient taking differently: Inject 50 Units into the skin 2 (two) times daily with a meal.  04/10/16  Yes Sudini, Alveta Heimlich, MD  metFORMIN (GLUCOPHAGE) 1000 MG tablet Take 1 tablet (1,000 mg total) by mouth 2 (two) times daily with a meal. Patient not taking: Reported on 04/17/2017 04/10/16   Hillary Bow, MD   Dg Chest 1 View  Result Date: 04/17/2017 CLINICAL DATA:  Hyperglycemia.  Weakness.  Weight loss. EXAM: CHEST 1 VIEW COMPARISON:  12/21/2016  FINDINGS: Mild hyperinflation. Midline trachea. Normal heart size. No pleural effusion or pneumothorax. Mild improvement in inferior right upper lobe airspace disease. IMPRESSION: Decreasing inferior right upper lobe airspace disease. Favor resolving infection and scar. No acute superimposed process. Electronically Signed   By: Abigail Miyamoto M.D.   On: 04/17/2017 14:59   Ct Hip Left Wo Contrast  Result Date: 04/17/2017 CLINICAL DATA:  Syncopal episode unable to bear weight on the left leg EXAM: CT OF THE LEFT HIP WITHOUT CONTRAST TECHNIQUE: Multidetector CT imaging of the left hip was performed according to the standard protocol. Multiplanar CT image reconstructions were also generated. COMPARISON:  Radiograph 04/17/2017 FINDINGS: Bones/Joint/Cartilage Comminuted fracture involving the greater trochanter of the proximal left femur with mild separation of fracture fragments superolaterally. Fracture lucency extends to the posterior, medial cortex. 14 mm calcification superior to the left femoral neck may represent nonspecific soft tissue calcification as opposed to displaced bone fragment. No definite fracture lucency within the lesser trochanter or femoral neck. No dislocation evident. Pubic symphysis is intact. The rami show no fracture. No evidence for acetabular fracture. Ligaments Suboptimally assessed by CT. Muscles and Tendons Normal muscle bulk. Edema and probable intramuscular hematoma of the proximal, lateral thigh muscles and anterior hip muscles. Soft tissues Edema within the soft tissues. Vascular calcifications in the left groin. Moderate feces retention in the rectum. IMPRESSION: Comminuted fracture involving  the greater trochanter of the left femur with mild separation of fracture fragments superior, laterally. No dislocation of the femoral head. Electronically Signed   By: Donavan Foil M.D.   On: 04/17/2017 20:59   Dg Hip Unilat W Or Wo Pelvis 2-3 Views Left  Result Date: 04/17/2017 CLINICAL  DATA:  Fall.  Pain. EXAM: DG HIP (WITH OR WITHOUT PELVIS) 2-3V LEFT COMPARISON:  None FINDINGS: Femoral heads are located. Sacroiliac joints are symmetric. Irregularity, most consistent with fracture in the greater trochanteric region of the left femur. IMPRESSION: Greater trochanteric left femur fracture. Electronically Signed   By: Abigail Miyamoto M.D.   On: 04/17/2017 14:38    Positive ROS: All other systems have been reviewed and were otherwise negative with the exception of those mentioned in the HPI and as above.  Physical Exam: General: Alert, no acute distress Cardiovascular: No pedal edema Respiratory: No cyanosis, no use of accessory musculature GI: No organomegaly, abdomen is soft and non-tender Skin: No lesions in the area of chief complaint Neurologic: Sensation intact distally Psychiatric: Patient is competent for consent with normal mood and affect Lymphatic: No axillary or cervical lymphadenopathy  MUSCULOSKELETAL: The left hip is tender to percussion and palpation.  The skin is intact with bruising.  Leg lengths are equal.  There is pain with rotation of the hip.  Neurologic exam is intact.  Patient is extremely thin, secondary to diabetes which is poorly controlled.  No other areas of pain are evident.  Assessment: Greater trochanteric fracture left hip  Plan: Ambulate with PT and a walker, weightbearing as tolerated on the left leg Ice left hip Return to clinic in 2 weeks for follow-up x-rays.    Park Breed, MD [phone]   04/18/2017 3:23 PM

## 2017-04-18 NOTE — Care Management Note (Signed)
Case Management Note  Patient Details  Name: Gregory Moody MRN: [id number] Date of Birth: Nov 01, 1975  Subjective/Objective:  Met with patient and interpreter at bedside. Application given for medication management and [facility]. He was going to [place] [facility] but stopped going so has not been taking his insulin. He has a glucometer.  He lives with family.                   Action/Plan: Following progression.    Expected Discharge Date:  04/19/17               Expected Discharge Plan:     In-House Referral:     Discharge planning Services  CM Consult, [facility], Medication Assistance  Post Acute Care Choice:    Choice offered to:     DME Arranged:    DME Agency:     HH Arranged:    HH Agency:     Status of Service:  In process, will continue to follow  If discussed at Long Length of Stay Meetings, dates discussed:    Additional Comments:  Jolly Mango, RN 04/18/2017, 11:27 AM

## 2017-04-19 LAB — PTH, INTACT AND CALCIUM
Calcium, Total (PTH): 8.6 mg/dL — ABNORMAL LOW (ref 8.7–10.2)
PTH: 23 pg/mL (ref 15–65)

## 2017-04-19 LAB — GLUCOSE, CAPILLARY
GLUCOSE-CAPILLARY: 63 mg/dL — AB (ref 65–99)
Glucose-Capillary: 127 mg/dL — ABNORMAL HIGH (ref 65–99)
Glucose-Capillary: 237 mg/dL — ABNORMAL HIGH (ref 65–99)
Glucose-Capillary: 157 mg/dL — ABNORMAL HIGH (ref 65–99)

## 2017-04-19 LAB — CALCIUM, URINE, RANDOM: Calcium, Ur: 1.9 mg/dL

## 2017-04-19 LAB — HIV ANTIBODY (ROUTINE TESTING W REFLEX): HIV Screen 4th Generation wRfx: NONREACTIVE

## 2017-04-19 MED ORDER — TRAMADOL HCL 50 MG PO TABS: 50.0000 mg | ORAL_TABLET | Freq: Four times a day (QID) | ORAL | 0 refills | Status: DC | PRN

## 2017-04-19 MED ORDER — ENSURE ENLIVE PO LIQD: 237.0000 mL | Freq: Two times a day (BID) | ORAL | 12 refills | Status: DC

## 2017-04-19 MED ORDER — ADULT MULTIVITAMIN W/MINERALS CH: 1.0000 | ORAL_TABLET | Freq: Every day | ORAL | 0 refills | Status: DC

## 2017-04-19 MED ORDER — SODIUM CHLORIDE 0.9 % IV BOLUS (SEPSIS)
500.0000 mL | Freq: Once | INTRAVENOUS | Status: AC
  Administered 2017-04-19: 500 mL via INTRAVENOUS

## 2017-04-19 MED ORDER — TRAMADOL HCL 50 MG PO TABS
50.0000 mg | ORAL_TABLET | Freq: Four times a day (QID) | ORAL | Status: DC | PRN
  Administered 2017-04-19: 50 mg via ORAL
  Filled 2017-04-19: qty 1

## 2017-04-19 MED ORDER — SODIUM CHLORIDE 0.9 % IV SOLN
INTRAVENOUS | Status: DC
  Administered 2017-04-19: 07:00:00 via INTRAVENOUS

## 2017-04-19 NOTE — Progress Notes (Signed)
Patient unable to void all night, states he feels like he can urinate, but is unable to. Bladder scan attempted several times, reading 1mL. MD paged.

## 2017-04-19 NOTE — Care Management Note (Signed)
Case Management Note  Patient Details  Name: Gregory Moody MRN: [id number] Date of Birth: [birth year]  Subjective/Objective:  TC to [facility] PT dept. Spoke with Hilda Blades. She states patient can be seen in their OP dept under charity care if he meets charity criteria. Faxed down a referral as she ask signed by Dr. Posey Pronto.  They will call him to set up an appointment. Jermaine with [facility] to get patient a walker under charity care.                  Action/Plan:   Expected Discharge Date:  04/19/17               Expected Discharge Plan:  OP Rehab  In-House Referral:     Discharge planning Services  CM Consult, [facility], Medication Assistance  Post Acute Care Choice:    Choice offered to:     DME Arranged:    DME Agency:     HH Arranged:    HH Agency:     Status of Service:  In process, will continue to follow  If discussed at Long Length of Stay Meetings, dates discussed:    Additional Comments:  Jolly Mango, RN 04/19/2017, 9:25 AM

## 2017-04-19 NOTE — Progress Notes (Signed)
Interpreter in the room. Discharge and medication instructions given to patient. All questions answered. Prescription for tramadol given to patient along with printed AVS. All patient belongings packed; Walker and bedside commode included. Patient escorted out via wheelchair.   Wynema Birch, RN

## 2017-04-19 NOTE — Progress Notes (Signed)
MD called back, ordered continuous NS @75 . Nursing continues to monitor.

## 2017-04-19 NOTE — Progress Notes (Signed)
Physical Therapy Treatment Patient Details Name: Gregory Moody MRN: [id number] DOB: 28-Aug-1976 Today's Date: 04/19/2017    History of Present Illness Pt is a 41 y/o M who presented s/p fall with L hip pain.  Pt found to have L greater trochanter fx.  Orthopedist consulted recommending conservative management.  Pt's PMH includes tuberculosis, diabetes, gastroparesis.   PT Comments    Pt made modest progress today.  He ambulated 30 ft with very decreased gait speed and remains very guarded today.  He is hesitant due to fear of pain when performing steps and requires max encouragement.  He completed stair training today with min guard assist.  Pt instructed to have someone with him at d/c when ascending/descending steps, when ambulating in home, and when performing transfers.  Pt verbalized understanding.     Follow Up Recommendations  Outpatient PT;Supervision for mobility/OOB     Equipment Recommendations  Rolling walker with 5" wheels    Recommendations for Other Services OT consult     Precautions / Restrictions Precautions Precautions: Fall Restrictions Weight Bearing Restrictions: Yes LLE Weight Bearing: Weight bearing as tolerated    Mobility  Bed Mobility Overal bed mobility: Needs Assistance Bed Mobility: Supine to Sit     Supine to sit: Min assist     General bed mobility comments: HOB flat and no use of rail to simulate home environment.  Pt provided with max cues for technique and sequencing for supine>sit; however is unable to perform bed mobility without assist.  Min assist provided to elevate trunk.    Transfers Overall transfer level: Needs assistance Equipment used: Rolling walker (2 wheeled) Transfers: Sit to/from Stand Sit to Stand: Min guard         General transfer comment: Min guard assist for safety as pt rises slowly.  He requires cues to scoot toward edge of seat and to bend R knee for greater mechanical advantage.  Pt controls descent to  chair well.  Ambulation/Gait Ambulation/Gait assistance: Supervision Ambulation Distance (Feet): 30 Feet Assistive device: Rolling walker (2 wheeled) Gait Pattern/deviations: Step-to pattern;Decreased stride length;Decreased stance time - left;Decreased step length - right;Decreased weight shift to left;Trunk flexed;Antalgic Gait velocity: decreased Gait velocity interpretation: Below normal speed for age/gender General Gait Details: Very decreased gait speed and pt demonstrates flexed posture, cues for upright.    Stairs Stairs: Yes   Stair Management: One rail Left;Step to pattern;Sideways Number of Stairs: 4 General stair comments: Demonstration prior to pt attempting.  Pt very hesitant and requires max increased time and encouragement.  Min guard provided for safety and pt instructed to have someone with him when ascending/descending steps at home.  Wheelchair Mobility    Modified Rankin (Stroke Patients Only)       Balance Overall balance assessment: Needs assistance Sitting-balance support: No upper extremity supported;Feet supported Sitting balance-Leahy Scale: Good     Standing balance support: Bilateral upper extremity supported;During functional activity Standing balance-Leahy Scale: Poor Standing balance comment: Relies on BUE support for static and dynamic activities in standing                            Cognition Arousal/Alertness: Awake/alert Behavior During Therapy: WFL for tasks assessed/performed Overall Cognitive Status: Within Functional Limits for tasks assessed  Exercises Other Exercises Other Exercises: Instructed pt to be out of chair or bed at home at least every hour to go for a short walk to decrease stiffness and pain.  Pt verbalized understanding of the importance of having someone with him when OOB.   Other Exercises: Instructed pt to have family memeber ensure there are no  wires or throw rugs at the house that pt could trip on.    General Comments General comments (skin integrity, edema, etc.): Spanish interpreter utilized: [name]      Pertinent Vitals/Pain Pain Assessment: Faces Faces Pain Scale: Hurts whole lot Pain Location: L hip Pain Descriptors / Indicators: Aching;Discomfort;Guarding Pain Intervention(s): Limited activity within patient's tolerance;Monitored during session;Premedicated before session;Repositioned    Home Living                      Prior Function            PT Goals (current goals can now be found in the care plan section) Acute Rehab PT Goals Patient Stated Goal: decreased pain PT Goal Formulation: With patient Time For Goal Achievement: 05/02/17 Potential to Achieve Goals: Good Progress towards PT goals: Progressing toward goals    Frequency    7X/week      PT Plan Current plan remains appropriate    Co-evaluation              AM-PAC PT "6 Clicks" Daily Activity  Outcome Measure  Difficulty turning over in bed (including adjusting bedclothes, sheets and blankets)?: Total Difficulty moving from lying on back to sitting on the side of the bed? : Total Difficulty sitting down on and standing up from a chair with arms (e.g., wheelchair, bedside commode, etc,.)?: A Little Help needed moving to and from a bed to chair (including a wheelchair)?: A Little Help needed walking in hospital room?: A Little Help needed climbing 3-5 steps with a railing? : A Little 6 Click Score: 14    End of Session Equipment Utilized During Treatment: Gait belt Activity Tolerance: Patient limited by pain Patient left: in chair;with call bell/phone within reach;with chair alarm set Nurse Communication: Mobility status PT Visit Diagnosis: Pain;Difficulty in walking, not elsewhere classified (R26.2);Other abnormalities of gait and mobility (R26.89);Unsteadiness on feet (R26.81) Pain - Right/Left: Left Pain - part of  body: Hip     Time: 9371-6967 PT Time Calculation (min) (ACUTE ONLY): 33 min  Charges:  $Gait Training: 8-22 mins $Therapeutic Activity: 8-22 mins                    G Codes:       Collie Siad PT, DPT 04/19/2017, 10:48 AM

## 2017-04-19 NOTE — Progress Notes (Signed)
Inpatient Diabetes Program Recommendations  AACE/ADA: New Consensus Statement on Inpatient Glycemic Control (2015)  Target Ranges:  Prepandial:   less than 140 mg/dL      Peak postprandial:   less than 180 mg/dL (1-2 hours)      Critically ill patients:  140 - 180 mg/dL   Lab Results  Component Value Date   GLUCAP 127 (H) 04/19/2017   HGBA1C 16.5 (H) 04/08/2016    Review of Glycemic Control  Results for [name], [name] (MRN [id number]) as of 04/19/2017 08:53  Ref. Range 04/18/2017 12:28 04/18/2017 16:12 04/18/2017 21:06 04/19/2017 07:25 04/19/2017 07:49  Glucose-Capillary Latest Ref Range: 65 - 99 mg/dL 245 (H) 200 (H) 195 (H) 63 (L) 127 (H)   Diabetes history: Type 2 x 10 years (over 200lbs at diagnosis) Outpatient Diabetes medications: Novolin 70/30 50 units bid- has not been taking it for months Current orders for Inpatient glycemic control: Levemir 30 units bid, Novolog 10 units tid, Novolog 0-9 units tid  Inpatient Diabetes Program Recommendations:  Low blood sugar this am, likely as a result of 40 units of 70/30 units given at 0949, Levemir 30 units at 1424 and Levemir  30 units at 2124.   Agree with current medications for blood sugar management, add Novolog 0-5 units qhs.   Gentry Fitz, RN, BA, MHA, CDE Diabetes Coordinator Inpatient Diabetes Program  [phone] (Team Pager) [phone] ([place]) 04/19/2017 9:11 AM

## 2017-04-19 NOTE — Evaluation (Signed)
Occupational Therapy Evaluation Patient Details Name: Gregory Moody MRN: [id number] DOB: 1976-08-08 Today's Date: 04/19/2017    History of Present Illness Pt is a 41 y/o M who presented s/p fall with L hip pain.  Pt found to have L greater trochanter fx.  Orthopedist consulted recommending conservative management.  Pt's PMH includes tuberculosis, diabetes, gastroparesis.   Clinical Impression   Pt seen for OT evaluation this date. Spanish-speaking interpreter utilized for session, as pt identifies Spanish as his primary/preferred language. Pt sitting EOB at start of session, preparing for upcoming discharge home with family this afternoon. Pt was independent at baseline, working full time Museum/gallery conservator) and driving, endorses 2 falls in past 12 months. No dizziness/nausea noted. Pt presents with 6/10 L hip pain, decreased ROM/strength in LLE, and need for min assist for LB ADL tasks due to noted impairments. Pt notes family will be able to provide needed level of assistance at home. Pt educated in adaptive strategies for tub transfers for bathing as well as falls prevention education to minimize risk of future falls. Pt verbalized understanding. Pt would benefit from skilled OT services while in the hospital in order to maximize return to PLOF and minimize falls risk, including a 3:1/BSC for home use to maximize safety with functional toilet transfers and minimize pain. No OT follow up anticipated after hospitalization, recommend pt follow discharge recommendations by physician. Notified RNCM of 3:1 needs.    Follow Up Recommendations  No OT follow up    Equipment Recommendations  3 in 1 bedside commode    Recommendations for Other Services       Precautions / Restrictions Precautions Precautions: Fall Restrictions Weight Bearing Restrictions: Yes LLE Weight Bearing: Weight bearing as tolerated      Mobility Bed Mobility               General bed mobility comments:  deferred, pt sitting EOB with RN in room at start of session  Transfers Overall transfer level: Needs assistance Equipment used: Rolling walker (2 wheeled) Transfers: Sit to/from Stand Sit to Stand: Min guard         General transfer comment: cues for hand placement to maximize safety, very slow/controlled when moving    Balance Overall balance assessment: Needs assistance Sitting-balance support: No upper extremity supported;Feet supported Sitting balance-Leahy Scale: Good     Standing balance support: Bilateral upper extremity supported;During functional activity Standing balance-Leahy Scale: Fair                             ADL either performed or assessed with clinical judgement   ADL Overall ADL's : Needs assistance/impaired             Lower Body Bathing: Minimal assistance;Sitting/lateral leans Lower Body Bathing Details (indicate cue type and reason): pt educated in adaptive techniques for tub transfers to maximize safety with verbal instruction utilizing the interpreter as well as visual demonstration Upper Body Dressing : Set up;Sitting   Lower Body Dressing: Minimal assistance;Sit to/from stand Lower Body Dressing Details (indicate cue type and reason): min assist to thread pants over L foot, pt able to complete donning of pants over RLE and over hips with close supervision during transitional sit to stand transfer with VC for hand placement to maximize safety Toilet Transfer: Min guard;Comfort height toilet;RW           Functional mobility during ADLs: Min guard;Rolling walker General ADL Comments: pt generally requires min assist  for initiating LB dressing tasks over L foot, pt notes his family can provide this level of assist     Vision Baseline Vision/History: No visual deficits (pt noted that sometimes first thing in the morning he has blurry yellow tinted vision but this resolves quickly, pt attributed this to his diabetes) Patient Visual  Report: No change from baseline Vision Assessment?: No apparent visual deficits     Perception     Praxis      Pertinent Vitals/Pain Pain Assessment: 0-10 Pain Score: 6  Pain Location: L hip Pain Descriptors / Indicators: Aching;Discomfort;Guarding Pain Intervention(s): Limited activity within patient's tolerance;Monitored during session     Hand Dominance Right   Extremity/Trunk Assessment Upper Extremity Assessment Upper Extremity Assessment: RUE deficits/detail;LUE deficits/detail RUE Deficits / Details: Strength grossly WFL; however pt presents with generalized atrophy/possible malnourishment BUEs and BLEs LUE Deficits / Details: Strength grossly WFL; however pt presents with generalized atrophy/possible malnourishment BUEs and BLEs   Lower Extremity Assessment Lower Extremity Assessment: RLE deficits/detail;LLE deficits/detail RLE Deficits / Details: Strength grossly WFL; however pt presents with generalized atrophy/possible malnourishment BUEs and BLEs LLE Deficits / Details: Strength grossly WFL; however pt presents with generalized atrophy/possible malnourishment BUEs and BLEs.  Pt able to perform SLR but not able to hold against resistance.  Functionally, pt moves very cautiously. LLE: Unable to fully assess due to pain   Cervical / Trunk Assessment Cervical / Trunk Assessment: Normal   Communication Communication Communication: Prefers language other than English (Spanish speaking, interpreter utilized during evaluation)   Cognition Arousal/Alertness: Awake/alert Behavior During Therapy: WFL for tasks assessed/performed Overall Cognitive Status: Within Functional Limits for tasks assessed                                     General Comments  Spanish interpreter utilized    Exercises     Shoulder Instructions      Home Living Family/patient expects to be discharged to:: Private residence Living Arrangements: Parent (mother and  father) Available Help at Discharge: Family;Available 24 hours/day Type of Home: Mobile home Home Access: Stairs to enter Entrance Stairs-Number of Steps: 5 Entrance Stairs-Rails: Left;Right Home Layout: One level     Bathroom Shower/Tub: Teacher, early years/pre: Standard     Home Equipment: Cane - single point;Crutches          Prior Functioning/Environment Level of Independence: Independent        Comments: indep with ADL, driving, working full time Museum/gallery conservator), endorses 2 falls in past 12 months (1 leading to this admission), endorses occasionally not taking insulin shots leading to his blood sugar spiking due to being "lazy"        OT Problem List: Pain;Decreased strength;Decreased activity tolerance;Impaired balance (sitting and/or standing);Decreased knowledge of use of DME or AE;Decreased range of motion      OT Treatment/Interventions: Self-care/ADL training;Therapeutic exercise;Therapeutic activities;Patient/family education;DME and/or AE instruction    OT Goals(Current goals can be found in the care plan section) Acute Rehab OT Goals Patient Stated Goal: decreased pain OT Goal Formulation: With patient Time For Goal Achievement: 05/03/17 Potential to Achieve Goals: Good  OT Frequency: Min 1X/week   Barriers to D/C:            Co-evaluation              AM-PAC PT "6 Clicks" Daily Activity     Outcome Measure Help from another  person eating meals?: None Help from another person taking care of personal grooming?: None Help from another person toileting, which includes using toliet, bedpan, or urinal?: A Little Help from another person bathing (including washing, rinsing, drying)?: A Little Help from another person to put on and taking off regular upper body clothing?: None Help from another person to put on and taking off regular lower body clothing?: A Little 6 Click Score: 21   End of Session Equipment Utilized During  Treatment: Rolling walker Nurse Communication: Other (comment) (notified RN of pt sitting EOB)  Activity Tolerance: Patient tolerated treatment well Patient left: in bed;with call bell/phone within reach  OT Visit Diagnosis: Other abnormalities of gait and mobility (R26.89);History of falling (Z91.81);Pain Pain - Right/Left: Left Pain - part of body: Hip                Time: 3559-7416 OT Time Calculation (min): 25 min Charges:  OT General Charges $OT Visit: 1 Procedure OT Evaluation $OT Eval Low Complexity: 1 Procedure G-Codes: OT G-codes **NOT FOR INPATIENT CLASS** Functional Assessment Tool Used: AM-PAC 6 Clicks Daily Activity;Clinical judgement Functional Limitation: Self care Self Care Current Status (L8453): At least 20 percent but less than 40 percent impaired, limited or restricted Self Care Goal Status (M4680): At least 20 percent but less than 40 percent impaired, limited or restricted   Jeni Salles, MPH, MS, OTR/L ascom [phone] 04/19/17, 3:57 PM

## 2017-04-19 NOTE — Care Management (Signed)
Spoke with Dr.Millers nurse. Updated her on PT POC.

## 2017-04-19 NOTE — Progress Notes (Signed)
Orthostatic vitals not completed, patient's lying BP was 84/51. MD notified, 525mL Bolus NS ordered. Nursing continues to monitor.

## 2017-04-19 NOTE — Care Management (Signed)
OT recommending bedside commode. Ordered from [facility]

## 2017-04-19 NOTE — Discharge Summary (Signed)
[facility] NAME: Shahzaib Azevedo    MR#:  [id number]  DATE OF BIRTH:  08-08-1976  DATE OF ADMISSION:  04/17/2017 ADMITTING PHYSICIAN: [name], MD  DATE OF DISCHARGE: 04/19/2017  PRIMARY CARE PHYSICIAN: Department, [facility] DIAGNOSIS:  Dehydration [E86.0] Hyperglycemia [R73.9] Closed fracture of left hip, initial encounter ([place]) [S72.002A]  DISCHARGE DIAGNOSIS:  Left greater trochanter fracture status post mechanical fall Hypotension resolved Uncontrolled type 2 diabetes improved  SECONDARY DIAGNOSIS:   Past Medical History:  Diagnosis Date  . Diabetes mellitus without complication ([place])   . Gastroparesis   . Tuberculosis     HOSPITAL COURSE:   SaulSantiago-Gonzalezis a 41 y.o.malewith a known history of Diabetes, gastroparesis, tuberculosis- was going to get a glass of water daily 3 in the morning and felt very dizzy and passed out, fall down on the hard floor and since then he was hurting in his left hip   * Nonketotic hyperglycemia with h/o DM-2 -sugars uncontrolled. Appears patient has issues with dietary compliance -Per diabetes coordinator changed to Levemir 30 units twice a day, NovoLog 10 units 3 times a day and sliding scale -Dietitian to see -sugars much better  * Left greater trochanter fracture--conservative management Orthopedic consult-Dr. Sabra Heck Recommends conservative management with physical therapy and when necessary pain meds Morphine and tramadol prn for pain management. Physical therapy recommends out pt PT  * Hyponatremia This is secondary to hyperglycemia  *relative hypotension--resolved  *DVT prophylaxis subcutaneous Lovenox CONSULTS OBTAINED:  Treatment Team:  Earnestine Leys, MD  DRUG ALLERGIES:  No Known Allergies  DISCHARGE MEDICATIONS:   Current Discharge Medication List    START taking these medications    Details  feeding supplement, ENSURE ENLIVE, (ENSURE ENLIVE) LIQD Take 237 mLs by mouth 2 (two) times daily with a meal. Qty: 237 mL, Refills: 12    Multiple Vitamin (MULTIVITAMIN WITH MINERALS) TABS tablet Take 1 tablet by mouth daily. Qty: 30 tablet, Refills: 0    traMADol (ULTRAM) 50 MG tablet Take 1 tablet (50 mg total) by mouth every 6 (six) hours as needed for moderate pain. Qty: 25 tablet, Refills: 0      CONTINUE these medications which have NOT CHANGED   Details  insulin NPH-regular Human (NOVOLIN 70/30) (70-30) 100 UNIT/ML injection Inject 70 Units into the skin 2 (two) times daily with a meal. Qty: 10 mL, Refills: 11    metFORMIN (GLUCOPHAGE) 1000 MG tablet Take 1 tablet (1,000 mg total) by mouth 2 (two) times daily with a meal. Qty: 60 tablet, Refills: 0        If you experience worsening of your admission symptoms, develop shortness of breath, life threatening emergency, suicidal or homicidal thoughts you must seek medical attention immediately by calling 911 or calling your MD immediately  if symptoms less severe.  You Must read complete instructions/literature along with all the possible adverse reactions/side effects for all the Medicines you take and that have been prescribed to you. Take any new Medicines after you have completely understood and accept all the possible adverse reactions/side effects.   Please note  You were cared for by a hospitalist during your hospital stay. If you have any questions about your discharge medications or the care you received while you were in the hospital after you are discharged, you can call the unit and asked to speak with the hospitalist on call if the hospitalist that took care of you is not  available. Once you are discharged, your primary care physician will handle any further medical issues. Please note that NO REFILLS for any discharge medications will be authorized once you are discharged, as it is imperative that you return  to your primary care physician (or establish a relationship with a primary care physician if you do not have one) for your aftercare needs so that they can reassess your need for medications and monitor your lab values. Today   SUBJECTIVE  Doing well   VITAL SIGNS:  Blood pressure 108/78, pulse 94, temperature 99 F (37.2 C), temperature source Oral, resp. rate 20, height 5\' 8"  (1.727 m), weight 44.2 kg (97 lb 6.4 oz), SpO2 99 %.  I/O:   Intake/Output Summary (Last 24 hours) at 04/19/17 1124 Last data filed at 04/19/17 0900  Gross per 24 hour  Intake              860 ml  Output                4 ml  Net              856 ml    PHYSICAL EXAMINATION:  GENERAL:  41 y.o.-year-old patient lying in the bed with no acute distress.  EYES: Pupils equal, round, reactive to light and accommodation. No scleral icterus. Extraocular muscles intact.  HEENT: Head atraumatic, normocephalic. Oropharynx and nasopharynx clear.  NECK:  Supple, no jugular venous distention. No thyroid enlargement, no tenderness.  LUNGS: Normal breath sounds bilaterally, no wheezing, rales,rhonchi or crepitation. No use of accessory muscles of respiration.  CARDIOVASCULAR: S1, S2 normal. No murmurs, rubs, or gallops.  ABDOMEN: Soft, non-tender, non-distended. Bowel sounds present. No organomegaly or mass.  EXTREMITIES: No pedal edema, cyanosis, or clubbing.  NEUROLOGIC: Cranial nerves II through XII are intact. Muscle strength 5/5 in all extremities. Sensation intact. Gait not checked.  PSYCHIATRIC: The patient is alert and oriented x 3.  SKIN: No obvious rash, lesion, or ulcer.   DATA REVIEW:   CBC   Recent Labs Lab 04/18/17 0437  WBC 4.6  HGB 10.9*  HCT 29.6*  PLT 191    Chemistries   Recent Labs Lab 04/17/17 1334 04/18/17 0437  NA 129* 134*  K 3.8 3.7  CL 94* 103  CO2 25 26  GLUCOSE 655* 447*  BUN 14 10  CREATININE 0.64 0.41*  CALCIUM 8.5*  8.6* 8.5*  AST 24  --   ALT 34  --   ALKPHOS 85  --    BILITOT 1.0  --     Microbiology Results   Recent Results (from the past 240 hour(s))  Urine culture     Status: None   Collection Time: 04/17/17  1:37 PM  Result Value Ref Range Status   Specimen Description URINE, RANDOM  Final   Special Requests NONE  Final   Culture   Final    NO GROWTH Performed at [facility], [place] [address], [place], [place] [address]    Report Status 04/18/2017 FINAL  Final  Culture, blood (routine x 2)     Status: None (Preliminary result)   Collection Time: 04/17/17  1:37 PM  Result Value Ref Range Status   Specimen Description BLOOD RIGHT WRIST  Final   Special Requests   Final    BOTTLES DRAWN AEROBIC AND ANAEROBIC Blood Culture results may not be optimal due to an excessive volume of blood received in culture bottles   Culture NO GROWTH 2 DAYS  Final   Report Status PENDING  Incomplete  Culture, blood (routine x 2)     Status: None (Preliminary result)   Collection Time: 04/17/17  1:40 PM  Result Value Ref Range Status   Specimen Description BLOOD LEFT ARM  Final   Special Requests   Final    BOTTLES DRAWN AEROBIC AND ANAEROBIC Blood Culture results may not be optimal due to an excessive volume of blood received in culture bottles   Culture NO GROWTH 2 DAYS  Final   Report Status PENDING  Incomplete    RADIOLOGY:  Dg Chest 1 View  Result Date: 04/17/2017 CLINICAL DATA:  Hyperglycemia.  Weakness.  Weight loss. EXAM: CHEST 1 VIEW COMPARISON:  12/21/2016 FINDINGS: Mild hyperinflation. Midline trachea. Normal heart size. No pleural effusion or pneumothorax. Mild improvement in inferior right upper lobe airspace disease. IMPRESSION: Decreasing inferior right upper lobe airspace disease. Favor resolving infection and scar. No acute superimposed process. Electronically Signed   By: Abigail Miyamoto M.D.   On: 04/17/2017 14:59   Ct Hip Left Wo Contrast  Result Date: 04/17/2017 CLINICAL DATA:  Syncopal episode unable to bear weight on the left leg  EXAM: CT OF THE LEFT HIP WITHOUT CONTRAST TECHNIQUE: Multidetector CT imaging of the left hip was performed according to the standard protocol. Multiplanar CT image reconstructions were also generated. COMPARISON:  Radiograph 04/17/2017 FINDINGS: Bones/Joint/Cartilage Comminuted fracture involving the greater trochanter of the proximal left femur with mild separation of fracture fragments superolaterally. Fracture lucency extends to the posterior, medial cortex. 14 mm calcification superior to the left femoral neck may represent nonspecific soft tissue calcification as opposed to displaced bone fragment. No definite fracture lucency within the lesser trochanter or femoral neck. No dislocation evident. Pubic symphysis is intact. The rami show no fracture. No evidence for acetabular fracture. Ligaments Suboptimally assessed by CT. Muscles and Tendons Normal muscle bulk. Edema and probable intramuscular hematoma of the proximal, lateral thigh muscles and anterior hip muscles. Soft tissues Edema within the soft tissues. Vascular calcifications in the left groin. Moderate feces retention in the rectum. IMPRESSION: Comminuted fracture involving the greater trochanter of the left femur with mild separation of fracture fragments superior, laterally. No dislocation of the femoral head. Electronically Signed   By: Donavan Foil M.D.   On: 04/17/2017 20:59   Dg Hip Unilat W Or Wo Pelvis 2-3 Views Left  Result Date: 04/17/2017 CLINICAL DATA:  Fall.  Pain. EXAM: DG HIP (WITH OR WITHOUT PELVIS) 2-3V LEFT COMPARISON:  None FINDINGS: Femoral heads are located. Sacroiliac joints are symmetric. Irregularity, most consistent with fracture in the greater trochanteric region of the left femur. IMPRESSION: Greater trochanteric left femur fracture. Electronically Signed   By: Abigail Miyamoto M.D.   On: 04/17/2017 14:38     Management plans discussed with the patient, family and they are in agreement.  CODE STATUS:     Code Status  Orders        Start     Ordered   04/17/17 2234  Full code  Continuous     04/17/17 2233    Code Status History    Date Active Date Inactive Code Status Order ID Comments User Context   04/06/2016  7:07 PM 04/10/2016  7:28 PM Full Code [id number]  Loletha Grayer, MD ED      TOTAL TIME TAKING CARE OF THIS PATIENT:  40 minutes.    Aleeza [place] M.D on 04/19/2017 at 11:24 AM  Between 7am to 6pm - Pager - [phone]  After 6pm go to [email or web address] - password [email or web address] [facility]  Office  [phone]  CC: Primary care physician; Department, [facility]

## 2017-04-21 ENCOUNTER — Observation Stay
Admission: EM | Admit: 2017-04-21 | Discharge: 2017-04-22 | Disposition: A | Discharge status: Home / Self Care | Payer: No Typology Code available for payment source | Attending: Internal Medicine | Admitting: Internal Medicine

## 2017-04-21 ENCOUNTER — Emergency Department: Payer: Self-pay

## 2017-04-21 DIAGNOSIS — Z79899 Other long term (current) drug therapy: Secondary | ICD-10-CM | POA: Insufficient documentation

## 2017-04-21 DIAGNOSIS — Z8611 Personal history of tuberculosis: Secondary | ICD-10-CM | POA: Insufficient documentation

## 2017-04-21 DIAGNOSIS — E1143 Type 2 diabetes mellitus with diabetic autonomic (poly)neuropathy: Secondary | ICD-10-CM | POA: Insufficient documentation

## 2017-04-21 DIAGNOSIS — E162 Hypoglycemia, unspecified: Secondary | ICD-10-CM | POA: Diagnosis present

## 2017-04-21 DIAGNOSIS — E1165 Type 2 diabetes mellitus with hyperglycemia: Secondary | ICD-10-CM

## 2017-04-21 DIAGNOSIS — Z833 Family history of diabetes mellitus: Secondary | ICD-10-CM | POA: Insufficient documentation

## 2017-04-21 DIAGNOSIS — E118 Type 2 diabetes mellitus with unspecified complications: Secondary | ICD-10-CM

## 2017-04-21 DIAGNOSIS — J690 Pneumonitis due to inhalation of food and vomit: Secondary | ICD-10-CM | POA: Diagnosis present

## 2017-04-21 DIAGNOSIS — J189 Pneumonia, unspecified organism: Secondary | ICD-10-CM

## 2017-04-21 DIAGNOSIS — E11649 Type 2 diabetes mellitus with hypoglycemia without coma: Principal | ICD-10-CM | POA: Insufficient documentation

## 2017-04-21 DIAGNOSIS — Z794 Long term (current) use of insulin: Secondary | ICD-10-CM | POA: Insufficient documentation

## 2017-04-21 DIAGNOSIS — K3184 Gastroparesis: Secondary | ICD-10-CM | POA: Insufficient documentation

## 2017-04-21 LAB — CBC WITH DIFFERENTIAL/PLATELET
BASOS ABS: 0 10*3/uL (ref 0–0.1)
BASOS PCT: 0 %
Eosinophils Absolute: 0 10*3/uL (ref 0–0.7)
Eosinophils Relative: 0 %
HEMATOCRIT: 29.3 % — AB (ref 40.0–52.0)
HEMOGLOBIN: 10.5 g/dL — AB (ref 13.0–18.0)
Lymphocytes Relative: 11 %
Lymphs Abs: 0.6 10*3/uL — ABNORMAL LOW (ref 1.0–3.6)
MCH: 32.8 pg (ref 26.0–34.0)
MCHC: 35.8 g/dL (ref 32.0–36.0)
MCV: 91.7 fL (ref 80.0–100.0)
MONOS PCT: 7 %
Monocytes Absolute: 0.4 10*3/uL (ref 0.2–1.0)
NEUTROS ABS: 4.6 10*3/uL (ref 1.4–6.5)
NEUTROS PCT: 82 %
Platelets: 240 10*3/uL (ref 150–440)
RBC: 3.19 MIL/uL — AB (ref 4.40–5.90)
RDW: 12.3 % (ref 11.5–14.5)
WBC: 5.7 10*3/uL (ref 3.8–10.6)

## 2017-04-21 LAB — COMPREHENSIVE METABOLIC PANEL
ALK PHOS: 38 U/L (ref 38–126)
ALT: 30 U/L (ref 17–63)
ANION GAP: 8 (ref 5–15)
AST: 35 U/L (ref 15–41)
Albumin: 2.9 g/dL — ABNORMAL LOW (ref 3.5–5.0)
BUN: 13 mg/dL (ref 6–20)
CALCIUM: 8.8 mg/dL — AB (ref 8.9–10.3)
CO2: 31 mmol/L (ref 22–32)
Chloride: 99 mmol/L — ABNORMAL LOW (ref 101–111)
Creatinine, Ser: <0.3 mg/dL — ABNORMAL LOW (ref 0.61–1.24)
Glucose, Bld: 35 mg/dL — CL (ref 65–99)
Potassium: 3.3 mmol/L — ABNORMAL LOW (ref 3.5–5.1)
SODIUM: 138 mmol/L (ref 135–145)
TOTAL PROTEIN: 6 g/dL — AB (ref 6.5–8.1)
Total Bilirubin: 1.1 mg/dL (ref 0.3–1.2)

## 2017-04-21 LAB — GLUCOSE, CAPILLARY
GLUCOSE-CAPILLARY: 162 mg/dL — AB (ref 65–99)
GLUCOSE-CAPILLARY: 32 mg/dL — AB (ref 65–99)

## 2017-04-21 IMAGING — DX DG CHEST 1V PORT
1 series · 1 of 1 positions shown · non-contrast
Comparison: Prior radiograph from [DATE].

CLINICAL DATA: Initial evaluation for crackles on exam.

EXAM:
PORTABLE CHEST 1 VIEW

[chest ap]
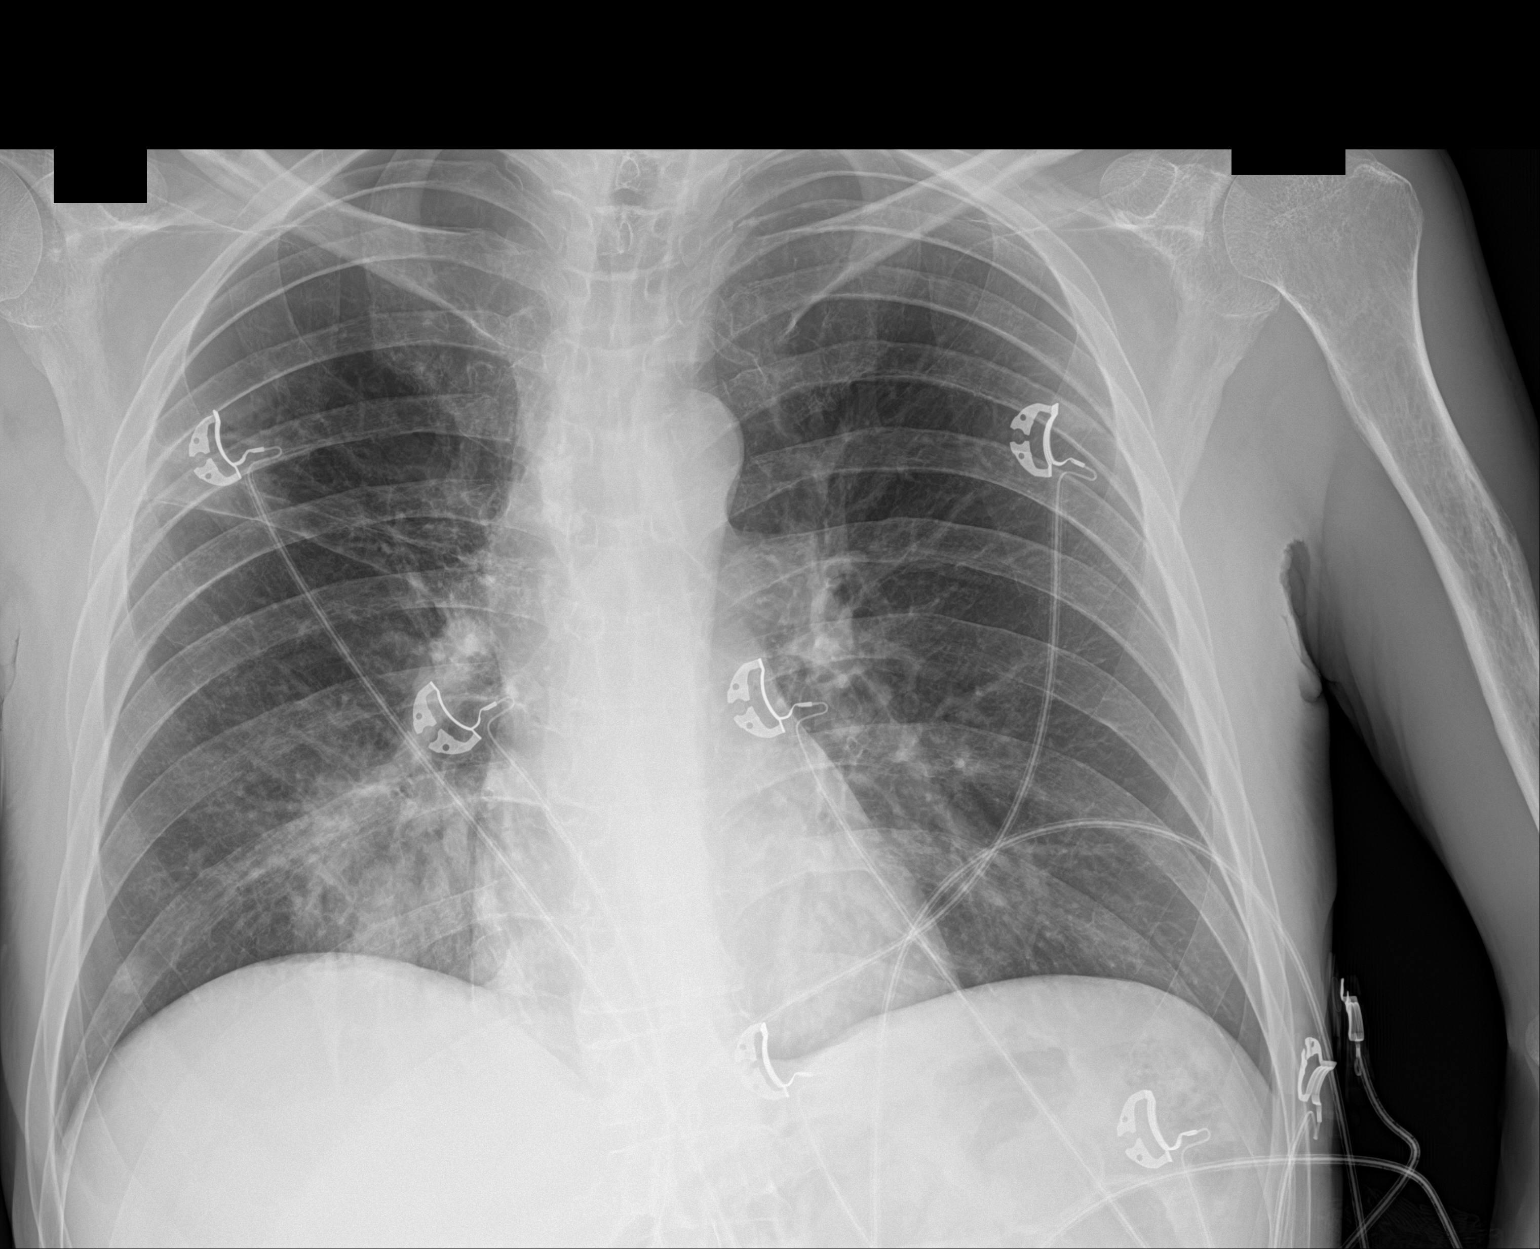

[1 of 1 positions shown; findings below may reference images not displayed]

FINDINGS: Cardiac and mediastinal silhouettes are stable in size and contour,
and remain within normal limits.

Lungs are mildly hypoinflated. Previously noted right upper lobe
airspace opacity has continued to decrease. There is a new right
lower lobe infiltrate at the right lung base, which may reflect
sequelae of infection or aspiration. No other new focal airspace
disease. No pulmonary edema or pleural effusion. No pneumothorax.

No acute osseus abnormality.
IMPRESSION: 1. New infiltrate at the medial right lower lobe, which may reflect
sequelae of infectious or aspiration pneumonitis.
2. Continued interval decrease in previously seen right upper lobe
airspace opacity with scarring.

## 2017-04-21 MED ORDER — PIPERACILLIN-TAZOBACTAM 3.375 G IVPB 30 MIN
INTRAVENOUS | Status: AC
  Filled 2017-04-21: qty 50

## 2017-04-21 MED ORDER — DEXTROSE 50 % IV SOLN
INTRAVENOUS | Status: AC
  Administered 2017-04-21: 50 mL via INTRAVENOUS
  Filled 2017-04-21: qty 50

## 2017-04-21 MED ORDER — DEXTROSE 50 % IV SOLN
1.0000 | Freq: Once | INTRAVENOUS | Status: AC
  Administered 2017-04-21: 50 mL via INTRAVENOUS

## 2017-04-21 MED ORDER — PIPERACILLIN-TAZOBACTAM 3.375 G IVPB 30 MIN
3.3750 g | Freq: Once | INTRAVENOUS | Status: AC
  Administered 2017-04-21: 3.375 g via INTRAVENOUS

## 2017-04-21 NOTE — ED Notes (Signed)
BS 162

## 2017-04-21 NOTE — ED Notes (Signed)
Interpreter at bedside with MD and this RN to speak with mother of pt to find out what happened. Pt had taken his insulin, but had not eaten. Pt stated he was hungry. Pt given meal tray and drink per MD.

## 2017-04-21 NOTE — ED Notes (Signed)
Pt was able to provide urine specimen. Pt had apparently spilled urine all over himself. Pt states he wasn't going to tell anyone. This RN told pt he needs to let someone know if he needs assistance. Pt verbalized understanding. Pt was cleaned up by this RN and EDT Ashlynn. Linens and gown changed. Pt now being transported to 1A

## 2017-04-21 NOTE — H&P (Signed)
[facility] NAME: Gregory Moody    MR#:  [id number]  DATE OF BIRTH:  03-07-1976  DATE OF ADMISSION:  04/21/2017  PRIMARY CARE PHYSICIAN: Department, [facility]   REQUESTING/REFERRING PHYSICIAN: Cinda Quest, MD  CHIEF COMPLAINT:   Chief Complaint  Patient presents with  . Altered Mental Status    HISTORY OF PRESENT ILLNESS:  Gregory Moody  is a 41 y.o. male who presents with Episode of lethargy and poor responsiveness. Patient's glucose level was low. Patient states that he was taking his insulin as prescribed, but he did not have very much appetite today and was not eating very well. Here in the ED he was found to have possible aspiration pneumonia or pneumonitis. Hospitalists were called for admission.  PAST MEDICAL HISTORY:   Past Medical History:  Diagnosis Date  . Diabetes mellitus without complication ([place])   . Gastroparesis   . Tuberculosis     PAST SURGICAL HISTORY:   Past Surgical History:  Procedure Laterality Date  . NO PAST SURGERIES      SOCIAL HISTORY:   Social History  Substance Use Topics  . Smoking status: Never Smoker  . Smokeless tobacco: Never Used  . Alcohol use No    FAMILY HISTORY:   Family History  Problem Relation Age of Onset  . Diabetes Mother   . Diabetes Father     DRUG ALLERGIES:  No Known Allergies  MEDICATIONS AT HOME:   Prior to Admission medications   Medication Sig Start Date End Date Taking? Authorizing Provider  hydrocortisone 2.5 % ointment Apply 1 application topically 2 (two) times daily as needed for rash.   Yes [provider]  insulin aspart (NOVOLOG) 100 UNIT/ML injection Inject 10 Units into the skin 3 (three) times daily with meals. Also per sliding scale   Yes [provider]  Multiple Vitamin (MULTIVITAMIN WITH MINERALS) TABS tablet Take 1 tablet by mouth daily. 04/20/17  Yes Fritzi Mandes, MD  traMADol  (ULTRAM) 50 MG tablet Take 1 tablet (50 mg total) by mouth every 6 (six) hours as needed for moderate pain. 04/19/17  Yes Fritzi Mandes, MD  feeding supplement, ENSURE ENLIVE, (ENSURE ENLIVE) LIQD Take 237 mLs by mouth 2 (two) times daily with a meal. 04/19/17   Fritzi Mandes, MD  insulin NPH-regular Human (NOVOLIN 70/30) (70-30) 100 UNIT/ML injection Inject 70 Units into the skin 2 (two) times daily with a meal. Patient not taking: Reported on 04/21/2017 04/10/16   Hillary Bow, MD  metFORMIN (GLUCOPHAGE) 1000 MG tablet Take 1 tablet (1,000 mg total) by mouth 2 (two) times daily with a meal. Patient not taking: Reported on 04/17/2017 04/10/16   Hillary Bow, MD    REVIEW OF SYSTEMS:  Review of Systems  Constitutional: Positive for malaise/fatigue. Negative for chills, fever and weight loss.  HENT: Negative for ear pain, hearing loss and tinnitus.   Eyes: Negative for blurred vision, double vision, pain and redness.  Respiratory: Negative for cough, hemoptysis and shortness of breath.   Cardiovascular: Negative for chest pain, palpitations, orthopnea and leg swelling.  Gastrointestinal: Negative for abdominal pain, constipation, diarrhea, nausea and vomiting.  Genitourinary: Negative for dysuria, frequency and hematuria.  Musculoskeletal: Negative for back pain, joint pain and neck pain.  Skin:       No acne, rash, or lesions  Neurological: Negative for dizziness, tremors, focal weakness and weakness.  Endo/Heme/Allergies: Negative for polydipsia. Does not bruise/bleed easily.  Psychiatric/Behavioral: Negative for  depression. The patient is not nervous/anxious and does not have insomnia.      VITAL SIGNS:   Vitals:   04/21/17 2130 04/21/17 2200 04/21/17 2230 04/21/17 2301  BP: 98/75 121/67 117/80 104/73  Pulse: 88 90 88 99  Resp: 14 13 14 20   Temp:      TempSrc:      SpO2: 98% 99% 100% 100%  Weight:      Height:       Wt Readings from Last 3 Encounters:  04/21/17 44 kg (97 lb)   04/17/17 44.2 kg (97 lb 6.4 oz)  04/06/16 50.6 kg (111 lb 9.6 oz)    PHYSICAL EXAMINATION:  Physical Exam  Vitals reviewed. Constitutional: He is oriented to person, place, and time. He appears well-developed and well-nourished. No distress.  HENT:  Head: Normocephalic and atraumatic.  Mouth/Throat: Oropharynx is clear and moist.  Eyes: Pupils are equal, round, and reactive to light. Conjunctivae and EOM are normal. No scleral icterus.  Neck: Normal range of motion. Neck supple. No JVD present. No thyromegaly present.  Cardiovascular: Normal rate, regular rhythm and intact distal pulses.  Exam reveals no gallop and no friction rub.   No murmur heard. Respiratory: Effort normal. No respiratory distress. He has no wheezes. He has rales.  GI: Soft. Bowel sounds are normal. He exhibits no distension. There is no tenderness.  Musculoskeletal: Normal range of motion. He exhibits no edema.  No arthritis, no gout  Lymphadenopathy:    He has no cervical adenopathy.  Neurological: He is alert and oriented to person, place, and time. No cranial nerve deficit.  No dysarthria, no aphasia  Skin: Skin is warm and dry. No rash noted. No erythema.  Psychiatric: He has a normal mood and affect. His behavior is normal. Judgment and thought content normal.    LABORATORY PANEL:   CBC  Recent Labs Lab 04/21/17 2114  WBC 5.7  HGB 10.5*  HCT 29.3*  PLT 240   ------------------------------------------------------------------------------------------------------------------  Chemistries   Recent Labs Lab 04/21/17 2114  NA 138  K 3.3*  CL 99*  CO2 31  GLUCOSE 35*  BUN 13  CREATININE <0.30*  CALCIUM 8.8*  AST 35  ALT 30  ALKPHOS 38  BILITOT 1.1   ------------------------------------------------------------------------------------------------------------------  Cardiac Enzymes No results for input(s): TROPONINI in the last 168  hours. ------------------------------------------------------------------------------------------------------------------  RADIOLOGY:  Dg Chest Portable 1 View  Result Date: 04/21/2017 CLINICAL DATA:  Initial evaluation for crackles on exam. EXAM: PORTABLE CHEST 1 VIEW COMPARISON:  Prior radiograph from 04/17/2017. FINDINGS: Cardiac and mediastinal silhouettes are stable in size and contour, and remain within normal limits. Lungs are mildly hypoinflated. Previously noted right upper lobe airspace opacity has continued to decrease. There is a new right lower lobe infiltrate at the right lung base, which may reflect sequelae of infection or aspiration. No other new focal airspace disease. No pulmonary edema or pleural effusion. No pneumothorax. No acute osseus abnormality. IMPRESSION: 1. New infiltrate at the medial right lower lobe, which may reflect sequelae of infectious or aspiration pneumonitis. 2. Continued interval decrease in previously seen right upper lobe airspace opacity with scarring. Electronically Signed   By: Jeannine Boga M.D.   On: 04/21/2017 21:54    EKG:   Orders placed or performed during the hospital encounter of 04/21/17  . EKG 12-Lead  . EKG 12-Lead  . EKG 12-Lead  . EKG 12-Lead  . EKG 12-Lead  . EKG 12-Lead    IMPRESSION AND PLAN:  Principal  Problem:   Aspiration pneumonia ([place]) - unclear when patient would have aspirated except during his period of lethargy today.  IV antibiotics in place Active Problems:   Hypoglycemia - likely due to his poor po intake combined with insulin use.  Glucose improved here with meal.   Diabetes ([place]) - sliding scale insulin with corresponding glucose checks  All the records are reviewed and case discussed with ED provider. Management plans discussed with the patient and/or family.  DVT PROPHYLAXIS: SubQ lovenox  GI PROPHYLAXIS: None  ADMISSION STATUS: Observation  CODE STATUS: Full Code Status History    Date Active  Date Inactive Code Status Order ID Comments User Context   04/17/2017 10:34 PM 04/19/2017  8:05 PM Full Code [id number]  [name], MD Inpatient   04/06/2016  7:07 PM 04/10/2016  7:28 PM Full Code [id number]  Loletha Grayer, MD ED      TOTAL TIME TAKING CARE OF THIS PATIENT: 40 minutes.   Cullen Lahaie [place] 04/21/2017, 11:18 PM  [facility]  Office  [phone]  CC: Primary care physician; Department, [facility]  Note:  This document was prepared using Dragon voice recognition software and may include unintentional dictation errors.

## 2017-04-21 NOTE — ED Provider Notes (Signed)
[facility] Emergency Department Provider Note   ____________________________________________   First MD Initiated Contact with Patient 04/21/17 2107     (approximate)  I have reviewed the triage vital signs and the nursing notes.   HISTORY  Chief Complaint Altered Mental Status  Low blood sugar  HPI Gregory Moody is a 41 y.o. male patient's wife came back from work and found him sleeping. EMS was called patient comes in with a blood sugar of 32 patient takes insulin and metformin. Patient has a history of noncompliance with medicines.   Past Medical History:  Diagnosis Date  . Diabetes mellitus without complication ([place])   . Gastroparesis   . Tuberculosis     Patient Active Problem List   Diagnosis Date Noted  . Hip fracture, unspecified laterality, closed, initial encounter ([place]) 04/17/2017  . Hyperglycemia 04/17/2017  . Protein-calorie malnutrition, severe 04/09/2016  . Cavitary lesion of lung 04/06/2016    Past Surgical History:  Procedure Laterality Date  . NO PAST SURGERIES      Prior to Admission medications   Medication Sig Start Date End Date Taking? Authorizing Provider  feeding supplement, ENSURE ENLIVE, (ENSURE ENLIVE) LIQD Take 237 mLs by mouth 2 (two) times daily with a meal. 04/19/17   Fritzi Mandes, MD  insulin NPH-regular Human (NOVOLIN 70/30) (70-30) 100 UNIT/ML injection Inject 70 Units into the skin 2 (two) times daily with a meal. Patient taking differently: Inject 50 Units into the skin 2 (two) times daily with a meal.  04/10/16   Hillary Bow, MD  metFORMIN (GLUCOPHAGE) 1000 MG tablet Take 1 tablet (1,000 mg total) by mouth 2 (two) times daily with a meal. Patient not taking: Reported on 04/17/2017 04/10/16   Hillary Bow, MD  Multiple Vitamin (MULTIVITAMIN WITH MINERALS) TABS tablet Take 1 tablet by mouth daily. 04/20/17   Fritzi Mandes, MD  traMADol (ULTRAM) 50 MG tablet Take 1 tablet (50 mg total) by mouth every 6  (six) hours as needed for moderate pain. 04/19/17   Fritzi Mandes, MD    Allergies Patient has no known allergies.  Family History  Problem Relation Age of Onset  . Diabetes Mother   . Diabetes Father     Social History Social History  Substance Use Topics  . Smoking status: Never Smoker  . Smokeless tobacco: Never Used  . Alcohol use No    Review of Systems  Constitutional: No fever/chills Eyes: No visual changes. ENT: No sore throat. Cardiovascular: Denies chest pain. Respiratory: Denies shortness of breath. Gastrointestinal: No abdominal pain.  No nausea, no vomiting.  No diarrhea.  No constipation. Genitourinary: Negative for dysuria. Musculoskeletal: Negative for back pain. Skin: Negative for rash. Neurological: Negative for headaches, focal weakness or numbness.   ____________________________________________   PHYSICAL EXAM:  VITAL SIGNS: ED Triage Vitals [04/21/17 2106]  Enc Vitals Group     BP      Pulse      Resp      Temp      Temp src      SpO2      Weight 97 lb (44 kg)     Height 5\' 6"  (1.676 m)     Head Circumference      Peak Flow      Pain Score      Pain Loc      Pain Edu?      Excl. in [place]?     Constitutional: Initially patient unresponsive Alert and oriented. Well appearing and in  no acute distress. Eyes: Conjunctivae are normal. PERRL. EOMI. Head: Atraumatic. Nose: No congestion/rhinnorhea. Mouth/Throat: Mucous membranes are moist.  Oropharynx non-erythematous. Neck: No stridor.   Cardiovascular: Normal rate, regular rhythm. Grossly normal heart sounds.  Good peripheral circulation. Respiratory: Normal respiratory effort.  No retractions. Lungs Some scattered crackles Gastrointestinal: Soft and nontender. No distention. No abdominal bruits. No CVA tenderness. Musculoskeletal: No lower extremity tenderness nor edema.  No joint effusions. Neurologic:  After glucose goes up Normal speech and language. No gross focal neurologic deficits  are appreciated. Patient does seem somewhat weak Skin:  Skin is warm, dry and intact. No rash noted. Psychiatric: Mood and affect are normal. Speech and behavior are normal.  ____________________________________________   LABS (all labs ordered are listed, but only abnormal results are displayed)  Labs Reviewed  COMPREHENSIVE METABOLIC PANEL - Abnormal; Notable for the following:       Result Value   Potassium 3.3 (*)    Chloride 99 (*)    Glucose, Bld 35 (*)    Creatinine, Ser <0.30 (*)    Calcium 8.8 (*)    Total Protein 6.0 (*)    Albumin 2.9 (*)    All other components within normal limits  CBC WITH DIFFERENTIAL/PLATELET - Abnormal; Notable for the following:    RBC 3.19 (*)    Hemoglobin 10.5 (*)    HCT 29.3 (*)    Lymphs Abs 0.6 (*)    All other components within normal limits  GLUCOSE, CAPILLARY - Abnormal; Notable for the following:    Glucose-Capillary 32 (*)    All other components within normal limits  GLUCOSE, CAPILLARY - Abnormal; Notable for the following:    Glucose-Capillary 162 (*)    All other components within normal limits  URINALYSIS, COMPLETE (UACMP) WITH MICROSCOPIC   ____________________________________________  EKG  EKG read and interpreted by me shows normal sinus rhythm rate of 93 normal axis nonspecific ST-T wave changes ____________________________________________  RADIOLOGY IMPRESSION: 1. New infiltrate at the medial right lower lobe, which may reflect sequelae of infectious or aspiration pneumonitis. 2. Continued interval decrease in previously seen right upper lobe airspace opacity with scarring.   Electronically Signed   By: Jeannine Boga M.D.   On: 04/21/2017 21:54  ____________________________________________   PROCEDURES  Procedure(s) performed:  Procedures  Critical Care performed:   ____________________________________________   INITIAL IMPRESSION / ASSESSMENT AND PLAN / ED COURSE  Pertinent labs &  imaging results that were available during my care of the patient were reviewed by me and considered in my medical decision making (see chart for details).  Patient wakes up after getting his IV glucose and says he did hurt his leg several days ago and was laying in bed waiting for someone to feed him he didn't get up to eat so his blood sugar dropped. He now seems to have a new pneumonia and new infiltrate goes along with his crackles feeling somewhat warm in a higher white blood count and normal he seems to be a little unreliable. I think he would be better if we put him in the hospital for a day or 2 again.      ____________________________________________   FINAL CLINICAL IMPRESSION(S) / ED DIAGNOSES  Final diagnoses:  Hypoglycemia  Healthcare-associated pneumonia      NEW MEDICATIONS STARTED DURING THIS VISIT:  New Prescriptions   No medications on file     Note:  This document was prepared using Dragon voice recognition software and may include unintentional dictation errors.  Nena Polio, MD 04/21/17 [phone]

## 2017-04-21 NOTE — ED Notes (Signed)
Date and time results received: 04/21/17 2151 (use smartphrase ".now" to insert current time)  Test: glucose Critical Value: 35  Name of Provider Notified: Malinda  Orders Received? Or Actions Taken?: Orders Received - See Orders for details

## 2017-04-21 NOTE — ED Notes (Signed)
Pt given meal tray. MD aware.  

## 2017-04-21 NOTE — ED Triage Notes (Addendum)
Pt brought from home by family for c/o unresponsiveness. Per family pt was found unresponsive. Pt awake at this time. BS upon arrival 28. MD Malinda at bedside

## 2017-04-22 LAB — CULTURE, BLOOD (ROUTINE X 2)
Culture: NO GROWTH
Culture: NO GROWTH

## 2017-04-22 LAB — BLOOD CULTURE ID PANEL (REFLEXED)
ACINETOBACTER BAUMANNII: NOT DETECTED
ACINETOBACTER BAUMANNII: NOT DETECTED
CANDIDA GLABRATA: NOT DETECTED
CANDIDA KRUSEI: NOT DETECTED
CANDIDA PARAPSILOSIS: NOT DETECTED
CANDIDA TROPICALIS: NOT DETECTED
CARBAPENEM RESISTANCE: NOT DETECTED
Candida albicans: NOT DETECTED
Candida albicans: NOT DETECTED
Candida glabrata: NOT DETECTED
Candida krusei: NOT DETECTED
Candida parapsilosis: NOT DETECTED
Candida tropicalis: NOT DETECTED
ENTEROBACTER CLOACAE COMPLEX: NOT DETECTED
ENTEROCOCCUS SPECIES: NOT DETECTED
ENTEROCOCCUS SPECIES: NOT DETECTED
ESCHERICHIA COLI: NOT DETECTED
Enterobacter cloacae complex: NOT DETECTED
Enterobacteriaceae species: NOT DETECTED
Enterobacteriaceae species: DETECTED — AB
Escherichia coli: DETECTED — AB
HAEMOPHILUS INFLUENZAE: NOT DETECTED
Haemophilus influenzae: NOT DETECTED
KLEBSIELLA OXYTOCA: NOT DETECTED
Klebsiella oxytoca: NOT DETECTED
Klebsiella pneumoniae: NOT DETECTED
Klebsiella pneumoniae: NOT DETECTED
LISTERIA MONOCYTOGENES: NOT DETECTED
LISTERIA MONOCYTOGENES: NOT DETECTED
METHICILLIN RESISTANCE: DETECTED — AB
Methicillin resistance: DETECTED — AB
NEISSERIA MENINGITIDIS: NOT DETECTED
Neisseria meningitidis: NOT DETECTED
PROTEUS SPECIES: NOT DETECTED
PROTEUS SPECIES: NOT DETECTED
PSEUDOMONAS AERUGINOSA: NOT DETECTED
Pseudomonas aeruginosa: NOT DETECTED
SERRATIA MARCESCENS: NOT DETECTED
SERRATIA MARCESCENS: NOT DETECTED
STAPHYLOCOCCUS AUREUS BCID: NOT DETECTED
STAPHYLOCOCCUS SPECIES: DETECTED — AB
STREPTOCOCCUS AGALACTIAE: NOT DETECTED
STREPTOCOCCUS PNEUMONIAE: NOT DETECTED
STREPTOCOCCUS SPECIES: NOT DETECTED
Staphylococcus aureus (BCID): NOT DETECTED
Staphylococcus species: DETECTED — AB
Streptococcus agalactiae: NOT DETECTED
Streptococcus pneumoniae: NOT DETECTED
Streptococcus pyogenes: NOT DETECTED
Streptococcus pyogenes: NOT DETECTED
Streptococcus species: NOT DETECTED

## 2017-04-22 LAB — BASIC METABOLIC PANEL
Anion gap: 10 (ref 5–15)
BUN: 13 mg/dL (ref 6–20)
CALCIUM: 8.6 mg/dL — AB (ref 8.9–10.3)
CHLORIDE: 98 mmol/L — AB (ref 101–111)
CO2: 29 mmol/L (ref 22–32)
CREATININE: 0.39 mg/dL — AB (ref 0.61–1.24)
GFR calc Af Amer: >60 mL/min (ref >60)
GFR calc non Af Amer: >60 mL/min (ref >60)
Glucose, Bld: 239 mg/dL — ABNORMAL HIGH (ref 65–99)
Potassium: 3.3 mmol/L — ABNORMAL LOW (ref 3.5–5.1)
SODIUM: 137 mmol/L (ref 135–145)

## 2017-04-22 LAB — URINALYSIS, COMPLETE (UACMP) WITH MICROSCOPIC
BACTERIA UA: NONE SEEN
BILIRUBIN URINE: NEGATIVE
GLUCOSE, UA: 150 mg/dL — AB
HGB URINE DIPSTICK: NEGATIVE
Ketones, ur: NEGATIVE mg/dL
LEUKOCYTES UA: NEGATIVE
NITRITE: NEGATIVE
PROTEIN: NEGATIVE mg/dL
RBC / HPF: NONE SEEN RBC/hpf (ref 0–5)
SPECIFIC GRAVITY, URINE: 1.012 (ref 1.005–1.030)
Squamous Epithelial / LPF: NONE SEEN
pH: 6 (ref 5.0–8.0)

## 2017-04-22 LAB — CBC
HCT: 27.6 % — ABNORMAL LOW (ref 40.0–52.0)
Hemoglobin: 10.1 g/dL — ABNORMAL LOW (ref 13.0–18.0)
MCH: 34.5 pg — AB (ref 26.0–34.0)
MCHC: 36.6 g/dL — ABNORMAL HIGH (ref 32.0–36.0)
MCV: 94.5 fL (ref 80.0–100.0)
PLATELETS: 230 10*3/uL (ref 150–440)
RBC: 2.92 MIL/uL — ABNORMAL LOW (ref 4.40–5.90)
RDW: 12.2 % (ref 11.5–14.5)
WBC: 6.2 10*3/uL (ref 3.8–10.6)

## 2017-04-22 LAB — GLUCOSE, CAPILLARY
GLUCOSE-CAPILLARY: 229 mg/dL — AB (ref 65–99)
GLUCOSE-CAPILLARY: 273 mg/dL — AB (ref 65–99)
Glucose-Capillary: 152 mg/dL — ABNORMAL HIGH (ref 65–99)
Glucose-Capillary: 284 mg/dL — ABNORMAL HIGH (ref 65–99)

## 2017-04-22 MED ORDER — ENSURE ENLIVE PO LIQD
237.0000 mL | Freq: Three times a day (TID) | ORAL | Status: DC
  Administered 2017-04-22 (×2): 237 mL via ORAL

## 2017-04-22 MED ORDER — TRAMADOL HCL 50 MG PO TABS
50.0000 mg | ORAL_TABLET | Freq: Four times a day (QID) | ORAL | Status: DC | PRN
  Administered 2017-04-22: 50 mg via ORAL
  Filled 2017-04-22: qty 1

## 2017-04-22 MED ORDER — PIPERACILLIN-TAZOBACTAM 3.375 G IVPB
3.3750 g | Freq: Three times a day (TID) | INTRAVENOUS | Status: DC
  Administered 2017-04-22 (×2): 3.375 g via INTRAVENOUS
  Filled 2017-04-22 (×2): qty 50

## 2017-04-22 MED ORDER — ACETAMINOPHEN 325 MG PO TABS: 650.0000 mg | ORAL_TABLET | Freq: Four times a day (QID) | ORAL | Status: DC | PRN

## 2017-04-22 MED ORDER — INSULIN ASPART 100 UNIT/ML ~~LOC~~ SOLN: 10.0000 [IU] | Freq: Three times a day (TID) | SUBCUTANEOUS | Status: DC

## 2017-04-22 MED ORDER — ACETAMINOPHEN 650 MG RE SUPP: 650.0000 mg | Freq: Four times a day (QID) | RECTAL | Status: DC | PRN

## 2017-04-22 MED ORDER — AMOXICILLIN-POT CLAVULANATE 875-125 MG PO TABS
1.0000 | ORAL_TABLET | Freq: Two times a day (BID) | ORAL | Status: DC
  Administered 2017-04-22: 1 via ORAL
  Filled 2017-04-22: qty 1

## 2017-04-22 MED ORDER — ONDANSETRON HCL 4 MG PO TABS: 4.0000 mg | ORAL_TABLET | Freq: Four times a day (QID) | ORAL | Status: DC | PRN

## 2017-04-22 MED ORDER — ADULT MULTIVITAMIN W/MINERALS CH
1.0000 | ORAL_TABLET | Freq: Every day | ORAL | Status: DC
  Administered 2017-04-22: 1 via ORAL
  Filled 2017-04-22: qty 1

## 2017-04-22 MED ORDER — AMOXICILLIN-POT CLAVULANATE 875-125 MG PO TABS: 1.0000 | ORAL_TABLET | Freq: Two times a day (BID) | ORAL | 0 refills | Status: DC

## 2017-04-22 MED ORDER — ONDANSETRON HCL 4 MG/2ML IJ SOLN: 4.0000 mg | Freq: Four times a day (QID) | INTRAMUSCULAR | Status: DC | PRN

## 2017-04-22 MED ORDER — ENOXAPARIN SODIUM 30 MG/0.3ML ~~LOC~~ SOLN: 30.0000 mg | SUBCUTANEOUS | Status: DC

## 2017-04-22 MED ORDER — INSULIN ASPART 100 UNIT/ML ~~LOC~~ SOLN
0.0000 [IU] | Freq: Three times a day (TID) | SUBCUTANEOUS | Status: DC
  Administered 2017-04-22: 5 [IU] via SUBCUTANEOUS
  Administered 2017-04-22: 3 [IU] via SUBCUTANEOUS
  Filled 2017-04-22 (×2): qty 1

## 2017-04-22 MED ORDER — INSULIN ASPART 100 UNIT/ML ~~LOC~~ SOLN
0.0000 [IU] | Freq: Every day | SUBCUTANEOUS | Status: DC
Start: 2017-04-22 — End: 2017-04-22

## 2017-04-22 MED ORDER — ENOXAPARIN SODIUM 40 MG/0.4ML ~~LOC~~ SOLN: 40.0000 mg | SUBCUTANEOUS | Status: DC

## 2017-04-22 MED ORDER — INSULIN NPH ISOPHANE & REGULAR (70-30) 100 UNIT/ML ~~LOC~~ SUSP: 35.0000 [IU] | Freq: Two times a day (BID) | SUBCUTANEOUS | 11 refills | Status: DC

## 2017-04-22 MED ORDER — INSULIN ASPART PROT & ASPART (70-30 MIX) 100 UNIT/ML ~~LOC~~ SUSP
35.0000 [IU] | Freq: Two times a day (BID) | SUBCUTANEOUS | Status: DC
  Administered 2017-04-22: 35 [IU] via SUBCUTANEOUS
  Filled 2017-04-22: qty 10

## 2017-04-22 NOTE — Discharge Summary (Signed)
[facility] NAME: Gregory Moody    MR#:  [id number]  DATE OF BIRTH:  [birth year]  DATE OF ADMISSION:  04/21/2017 ADMITTING PHYSICIAN: Lance Coon, MD  DATE OF DISCHARGE:04/22/17  PRIMARY CARE PHYSICIAN: Department, [place] DIAGNOSIS:  Hypoglycemia [E16.2] Healthcare-associated pneumonia [J18.9]  DISCHARGE DIAGNOSIS:  Hypoglycemia -resolved Right side Aspiration pneumonia Type-2 Dm  SECONDARY DIAGNOSIS:   Past Medical History:  Diagnosis Date  . Diabetes mellitus without complication ([place])   . Gastroparesis   . Tuberculosis     HOSPITAL COURSE:   SaulSantiago-Gonzalezis a 41 y.o.malewith a known history of Diabetes, gastroparesis, tuberculosis- was going to get a glass of water daily 3 in the morning and felt very dizzy and passed out, fall down on the hard floor and since then he was hurting in his left hip   * Hypoglycemia -pt came in with sugars in the 30's. He had slept all day yday and did not eat food. Last insulin he took was on Wednesday -has issues with non complaince in the past -sugars now in the 200's -resume 70/30 insulin 35 units bid (he can only afford this kind0 -hold metformin to avoid further sugar drops. This can be resumed if sugars remain high as outpt---defer to Pcp  *right side aspiration pneumonitis  -po augmentin -sats good -no respiratory complaints.  *recent Left greater trochanter fracture--conservative management  *DVT prophylaxis subcutaneous Lovenox  * nh/o malnutiriton already seen by dietitan last admission  Overall stable D/c home later today CONSULTS OBTAINED:    DRUG ALLERGIES:  No Known Allergies  DISCHARGE MEDICATIONS:   Current Discharge Medication List    START taking these medications   Details  amoxicillin-clavulanate (AUGMENTIN) 875-125 MG tablet Take 1 tablet by mouth every 12 (twelve) hours. Qty: 12  tablet, Refills: 0      CONTINUE these medications which have CHANGED   Details  insulin NPH-regular Human (NOVOLIN 70/30) (70-30) 100 UNIT/ML injection Inject 35 Units into the skin 2 (two) times daily with a meal. Qty: 10 mL, Refills: 11      CONTINUE these medications which have NOT CHANGED   Details  hydrocortisone 2.5 % ointment Apply 1 application topically 2 (two) times daily as needed for rash.    Multiple Vitamin (MULTIVITAMIN WITH MINERALS) TABS tablet Take 1 tablet by mouth daily. Qty: 30 tablet, Refills: 0    traMADol (ULTRAM) 50 MG tablet Take 1 tablet (50 mg total) by mouth every 6 (six) hours as needed for moderate pain. Qty: 25 tablet, Refills: 0    feeding supplement, ENSURE ENLIVE, (ENSURE ENLIVE) LIQD Take 237 mLs by mouth 2 (two) times daily with a meal. Qty: 237 mL, Refills: 12      STOP taking these medications     insulin aspart (NOVOLOG) 100 UNIT/ML injection      metFORMIN (GLUCOPHAGE) 1000 MG tablet         If you experience worsening of your admission symptoms, develop shortness of breath, life threatening emergency, suicidal or homicidal thoughts you must seek medical attention immediately by calling 911 or calling your MD immediately  if symptoms less severe.  You Must read complete instructions/literature along with all the possible adverse reactions/side effects for all the Medicines you take and that have been prescribed to you. Take any new Medicines after you have completely understood and accept all the possible adverse reactions/side effects.   Please note  You were cared  for by a hospitalist during your hospital stay. If you have any questions about your discharge medications or the care you received while you were in the hospital after you are discharged, you can call the unit and asked to speak with the hospitalist on call if the hospitalist that took care of you is not available. Once you are discharged, your primary care physician will  handle any further medical issues. Please note that NO REFILLS for any discharge medications will be authorized once you are discharged, as it is imperative that you return to your primary care physician (or establish a relationship with a primary care physician if you do not have one) for your aftercare needs so that they can reassess your need for medications and monitor your lab values. Today   SUBJECTIVE   Doing well. Eating good  VITAL SIGNS:  Blood pressure 119/74, pulse (!) 110, temperature 98.6 F (37 C), temperature source Axillary, resp. rate 20, height 5\' 6"  (1.676 m), weight 44 kg (97 lb), SpO2 99 %.  I/O:   Intake/Output Summary (Last 24 hours) at 04/22/17 1227 Last data filed at 04/22/17 0926  Gross per 24 hour  Intake              290 ml  Output              300 ml  Net              -10 ml    PHYSICAL EXAMINATION:  GENERAL:  41 y.o.-year-old patient lying in the bed with no acute distress. thin EYES: Pupils equal, round, reactive to light and accommodation. No scleral icterus. Extraocular muscles intact.  HEENT: Head atraumatic, normocephalic. Oropharynx and nasopharynx clear.  NECK:  Supple, no jugular venous distention. No thyroid enlargement, no tenderness.  LUNGS: Normal breath sounds bilaterally, no wheezing, rales,rhonchi or crepitation. No use of accessory muscles of respiration.  CARDIOVASCULAR: S1, S2 normal. No murmurs, rubs, or gallops.  ABDOMEN: Soft, non-tender, non-distended. Bowel sounds present. No organomegaly or mass.  EXTREMITIES: No pedal edema, cyanosis, or clubbing.  NEUROLOGIC: Cranial nerves II through XII are intact. Muscle strength 5/5 in all extremities. Sensation intact. Gait not checked.  PSYCHIATRIC: The patient is alert and oriented x 3.  SKIN: No obvious rash, lesion, or ulcer.   DATA REVIEW:   CBC   Recent Labs Lab 04/22/17 0351  WBC 6.2  HGB 10.1*  HCT 27.6*  PLT 230    Chemistries   Recent Labs Lab 04/21/17 2114  04/22/17 0351  NA 138 137  K 3.3* 3.3*  CL 99* 98*  CO2 31 29  GLUCOSE 35* 239*  BUN 13 13  CREATININE <0.30* 0.39*  CALCIUM 8.8* 8.6*  AST 35  --   ALT 30  --   ALKPHOS 38  --   BILITOT 1.1  --     Microbiology Results   Recent Results (from the past 240 hour(s))  Urine culture     Status: None   Collection Time: 04/17/17  1:37 PM  Result Value Ref Range Status   Specimen Description URINE, RANDOM  Final   Special Requests NONE  Final   Culture   Final    NO GROWTH Performed at [facility], [place] [address], [place], [place] [address]    Report Status 04/18/2017 FINAL  Final  Culture, blood (routine x 2)     Status: None   Collection Time: 04/17/17  1:37 PM  Result Value Ref Range  Status   Specimen Description BLOOD RIGHT WRIST  Final   Special Requests   Final    BOTTLES DRAWN AEROBIC AND ANAEROBIC Blood Culture results may not be optimal due to an excessive volume of blood received in culture bottles   Culture NO GROWTH 5 DAYS  Final   Report Status 04/22/2017 FINAL  Final  Culture, blood (routine x 2)     Status: None   Collection Time: 04/17/17  1:40 PM  Result Value Ref Range Status   Specimen Description BLOOD LEFT ARM  Final   Special Requests   Final    BOTTLES DRAWN AEROBIC AND ANAEROBIC Blood Culture results may not be optimal due to an excessive volume of blood received in culture bottles   Culture NO GROWTH 5 DAYS  Final   Report Status 04/22/2017 FINAL  Final  Culture, blood (routine x 2)     Status: None (Preliminary result)   Collection Time: 04/21/17 10:55 PM  Result Value Ref Range Status   Specimen Description BLOOD RT HAND  Final   Special Requests Blood Culture adequate volume  Final   Culture NO GROWTH < 12 HOURS  Final   Report Status PENDING  Incomplete  Culture, blood (routine x 2)     Status: None (Preliminary result)   Collection Time: 04/21/17 10:55 PM  Result Value Ref Range Status   Specimen Description BLOOD RT [facility]  Final    Special Requests   Final    Blood Culture results may not be optimal due to an excessive volume of blood received in culture bottles   Culture NO GROWTH < 12 HOURS  Final   Report Status PENDING  Incomplete    RADIOLOGY:  Dg Chest Portable 1 View  Result Date: 04/21/2017 CLINICAL DATA:  Initial evaluation for crackles on exam. EXAM: PORTABLE CHEST 1 VIEW COMPARISON:  Prior radiograph from 04/17/2017. FINDINGS: Cardiac and mediastinal silhouettes are stable in size and contour, and remain within normal limits. Lungs are mildly hypoinflated. Previously noted right upper lobe airspace opacity has continued to decrease. There is a new right lower lobe infiltrate at the right lung base, which may reflect sequelae of infection or aspiration. No other new focal airspace disease. No pulmonary edema or pleural effusion. No pneumothorax. No acute osseus abnormality. IMPRESSION: 1. New infiltrate at the medial right lower lobe, which may reflect sequelae of infectious or aspiration pneumonitis. 2. Continued interval decrease in previously seen right upper lobe airspace opacity with scarring. Electronically Signed   By: Jeannine Boga M.D.   On: 04/21/2017 21:54     Management plans discussed with the patient, family and they are in agreement.  CODE STATUS:     Code Status Orders        Start     Ordered   04/22/17 0005  Full code  Continuous     04/22/17 0004    Code Status History    Date Active Date Inactive Code Status Order ID Comments User Context   04/17/2017 10:34 PM 04/19/2017  8:05 PM Full Code [id number]  [name], MD Inpatient   04/06/2016  7:07 PM 04/10/2016  7:28 PM Full Code [id number]  Loletha Grayer, MD ED      TOTAL TIME TAKING CARE OF THIS PATIENT: 40 minutes.    Peggye Poon M.D on 04/22/2017 at 12:27 PM  Between 7am to 6pm - Pager - [phone] After 6pm go to [email or web address] - password [email or web address]  [employer]  [phone]  CC: Primary care physician; Department, [facility]

## 2017-04-22 NOTE — Progress Notes (Signed)
Interpreter in room, discharge instructions and medication details reviewed with patient and family. Patient and family verbalized understanding of  the new changes in insulin. Information about hypoglycemia and hyperglycemia were given to patient as well as printed education regarding diabetic diet. All questions answered. Printed prescriptions for insulin and Augmentin were provided. IV removed. Patient was escorted out via wheelchair.   Wynema Birch, RN

## 2017-04-22 NOTE — Care Management (Signed)
Referred again to [facility].

## 2017-04-22 NOTE — Discharge Instructions (Addendum)
La diabetes mellitus y los alimentos (Diabetes Mellitus and Food) Es importante que controle su nivel de azcar en la sangre (glucosa). El nivel de glucosa en sangre depende en gran medida de lo que usted come. Comer alimentos saludables en las cantidades [place] a lo largo del Training and development officer, aproximadamente a la misma hora [employer], lo ayudar a Chief Technology Officer su nivel de Multimedia programmer. Tambin puede ayudarlo a retrasar o Patent attorney de la diabetes mellitus. Comer de [employer] saludable incluso puede ayudarlo a Chartered loss adjuster de presin arterial y a Science writer o Theatre manager un peso saludable. Entre las recomendaciones generales para alimentarse y Audiological scientist los alimentos de forma saludable, se incluyen las siguientes:  Respetar las comidas principales y comer colaciones con regularidad. Evitar pasar largos perodos sin comer con el fin de perder peso.  Seguir una dieta que consista principalmente en alimentos de origen vegetal, como frutas, vegetales, frutos secos, legumbres y cereales integrales.  Utilizar mtodos de coccin a baja temperatura, como hornear, en lugar de mtodos de coccin a alta temperatura, como frer en abundante aceite. Trabaje con el nutricionista para aprender a Financial planner nutricional de las etiquetas de los alimentos. CMO PUEDEN AFECTARME LOS ALIMENTOS? Carbohidratos Los carbohidratos afectan el nivel de glucosa en sangre ms que cualquier otro tipo de alimento. El nutricionista lo ayudar a Teacher, adult education cuntos carbohidratos puede consumir en cada comida y ensearle a contarlos. El recuento de carbohidratos es importante para mantener la glucosa en sangre en un nivel saludable, en especial si utiliza insulina o toma determinados medicamentos para la diabetes mellitus. Alcohol El alcohol puede provocar disminuciones sbitas de la glucosa en sangre (hipoglucemia), en especial si utiliza insulina o toma determinados medicamentos para la diabetes mellitus. La  hipoglucemia es una afeccin que puede poner en peligro la vida. Los sntomas de la hipoglucemia (somnolencia, mareos y Data processing manager) son similares a los sntomas de haber consumido mucho alcohol. Si el mdico lo autoriza a beber alcohol, hgalo con moderacin y siga estas pautas:  Las mujeres no deben beber ms de un trago por da, y los hombres no deben beber ms de dos tragos por Training and development officer. Un trago es igual a: ? 12 onzas (355 ml) de cerveza ? 5 onzas de vino (150 ml) de vino ? 1,5onzas (58ml) de bebidas espirituosas  No beba con el estmago vaco.  Mantngase hidratado. Beba agua, gaseosas dietticas o t helado sin azcar.  Las gaseosas comunes, los jugos y otros refrescos podran contener muchos carbohidratos y se Civil Service fast streamer. QU ALIMENTOS NO SE RECOMIENDAN? Cuando haga las elecciones de alimentos, es importante que recuerde que todos los alimentos son distintos. Algunos tienen menos nutrientes que otros por porcin, aunque podran tener la misma cantidad de caloras o carbohidratos. Es difcil darle al cuerpo lo que necesita cuando consume alimentos con menos nutrientes. Estos son algunos ejemplos de alimentos que debera evitar ya que contienen muchas caloras y carbohidratos, pero pocos nutrientes:  Physicist, medical trans (la mayora de los alimentos procesados incluyen grasas trans en la etiqueta de Informacin nutricional).  Gaseosas comunes.  Jugos.  Caramelos.  Dulces, como tortas, pasteles, rosquillas y [place].  Comidas fritas. QU ALIMENTOS PUEDO COMER? Consuma alimentos ricos en nutrientes, que nutrirn el cuerpo y lo mantendrn saludable. Los alimentos que debe comer tambin dependern de varios factores, como:  Las caloras que necesita.  Los medicamentos que toma.  Su peso.  El nivel de glucosa en [place].  El [place] de presin arterial.  El nivel de colesterol. Debe consumir  una amplia variedad de alimentos, por ejemplo:  Protenas. ? Cortes de [employer]. ? Protenas con bajo contenido de grasas saturadas, como pescado, clara de huevo y frijoles. Evite las carnes procesadas.  Frutas y vegetales. ? [place] y Photographer que pueden ayudar a [employer] niveles sanguneos de [place], como [place], mangos y batatas.  Productos lcteos. ? Elija productos lcteos sin grasa o con bajo contenido de [place], como [place], yogur y [place].  Cereales, panes, pastas y arroz. ? Elija cereales integrales, como panes multicereales, avena en grano y arroz integral. Estos alimentos pueden ayudar a controlar la presin arterial.  Daphene Jaeger. ? Alimentos que contengan grasas saludables, como frutos secos, Musician, aceite de [place], aceite de canola y pescado. TODOS LOS QUE PADECEN DIABETES MELLITUS TIENEN EL [place] PLAN DE [place]? Dado que todas las personas que padecen diabetes mellitus son distintas, no hay un solo plan de comidas que funcione para todos. Es muy importante que se rena con un nutricionista que lo ayudar a crear un plan de comidas adecuado para usted. Esta informacin no tiene Marine scientist el consejo del mdico. Asegrese de hacerle al mdico cualquier pregunta que tenga. Document Released: 01/04/2008 Document Revised: 10/18/2014 Document Reviewed: 08/24/2013 Elsevier Interactive Patient Education  2017 [place] sugar at least 2 times a day  Hiperglucemia (Hyperglycemia) La hiperglucemia se produce cuando el nivel de glucosa en la sangre es demasiado alto. La glucosa es un tipo de azcar que es la principal fuente de energa del cuerpo. Hormonas, como la insulina y Secretary/administrator, Probation officer el nivel de glucosa en la [place]. La insulina reduce el nivel de glucosa en la sangre, mientras que el glucagn lo Ages. A veces, el cuerpo no elabora la suficiente cantidad de insulina o no puede responder normalmente a la insulina que produce. Esto puede hacer que aumente el nivel de glucosa en la [place]. La hiperglucemia le puede ocurrir  tanto a las personas que tienen diabetes como a las que no la tienen. Puede desarrollarse despacio o rpidamente y ser una emergencia mdica. CAUSAS Esta afeccin puede ser causada por lo siguiente:  Tener prediabetes. Las personas con prediabetes tienen hiperglucemia que no es lo suficientemente alta como para ser diagnosticada como diabetes.  Tener diabetes y no saberlo.  Tener diabetes y: ? No seguir el plan de comidas. ? No tomar los medicamentos para la diabetes o no tomarlos correctamente. ? Realizar menos actividad fsica que lo normal. ? Ser demasiado activo fsicamente. ? Estar enfermo o tener una infeccin. ? Tomar otros tipos de medicamentos que afectan negativamente el control de la diabetes o incrementan la glucemia en la sangre.  Estar estresado.  Tomar corticoides. FACTORES DE RIESGO Es ms probable que tenga esta afeccin si tiene riesgo de padecer diabetes. Entre ellos se incluyen personas que:  Tienen antecedentes familiares de diabetes.  Son afroamericanas, asiticas, hispanas o latinas, o descendientes de indgenas norteamericanos.  Tienen ms de 45 aos.  Han tenido hiperglucemia durante un embarazo.  Tienen sobrepeso u obesidad.  Tienen hipertensin arterial.  Tiene el colesterol elevado.  No realizan suficiente actividad fsica (tiene un estilo de vida sedentario). SNTOMAS Puede tener hiperglucemia y no tener ningn sntoma. Si tiene sntomas, estos pueden [employer] siguientes:  Aumento de la cantidad [place].  Aumento de la sed.  Sequedad en la boca.  Aumento o prdida del apetito.  Cambios en la visin, como visin borrosa.  Cansancio (fatiga).  Aliento con USAA a fruta.  Debilidad.  Aumento o  prdida de peso involuntarios. La prdida de peso puede ser muy rpida.  Hormigueo o adormecimiento en las manos o los pies.  Dolor de [place].  Cuando pellizca la piel, esta no vuelve rpidamente a su lugar al soltarla.  Dolor en el  abdomen.  Cortes o moretones que tardan en sanarse. DIAGNSTICO Su mdico puede sospechar que tiene hiperglucemia por su historia clnica y un examen fsico. El diagnstico se realiza con un anlisis de sangre que mide el nivel de glucosa en la [place]. Tambin pueden hacerle otros anlisis de sangre para ayudar a Pension scheme manager la causa de la hiperglucemia. Estos incluyen los siguientes:  Un anlisis de sangre de A1C (hemoglobina A1c). Este estudio [place] cul fue el nivel promedio de glucosa en la sangre en un perodo de 3 meses.  Un anlisis de glucosa en la sangre despus de ocho horas de [place].  Prueba de tolerancia a la glucosa. Este estudio se realiza despus de tomar una bebida azucarada. TRATAMIENTO El tratamiento depende de la causa de esta afeccin. El tratamiento puede incluir lo siguiente:  [place], Eliezer Lofts o ajustar los medicamentos para la diabetes. Es muy importante tomar cualquier medicamento para el tratamiento de la diabetes como se lo haya indicado el mdico.   Quarry manager o Development worker, international aid plan de comidas.  Tratar una enfermedad o afeccin.  Controlar con ms frecuencia el nivel de glucosa en la sangre.  Cambiar el plan de ejercicios.  Disminuir o interrumpir la ingesta de corticoides.  Cambios en el estilo de vida. Estos pueden incluir los siguientes: ? Psychologist, occupational fsica. ? [place] de [place]. ? Mantener una dieta saludable.  Entender cules deben ser sus niveles de glucosa y qu hacer si [place]. INSTRUCCIONES PARA EL CUIDADO EN EL HOGAR  Si tiene diabetes, siga su plan de control de la diabetes. Haga lo siguiente: ? [employer]. ? Siga el plan de ejercicio. ? Siga el plan de comidas. ? Controle su nivel de glucosa en la sangre peridicamente. Controle su nivel de glucosa en la sangre antes y despus de ejercitarse. Si hace ejercicio durante ms tiempo o de [employer] de lo habitual, asegrese de Chief Technology Officer su nivel de  glucosa en la sangre con mayor frecuencia. ? Use su pulsera de alerta mdica, que indica que usted tiene diabetes.  Beba suficiente lquido para Consulting civil engineer orina clara o de color amarillo plido.  Mantenga un peso saludable con dieta y ejercicios. Pregntele a su mdico cul es su peso ideal.  No consuma ningn producto que contenga tabaco, lo que incluye cigarrillos, tabaco de Higher education careers adviser y Psychologist, sport and exercise. Si necesita ayuda para dejar de fumar, consulte al mdico.  Limite el consumo de alcohol a no ms de 1 medida por da si es mujer y no est Music therapist, y 2 medidas si es hombre. Una medida equivale a 12onzas de cerveza, 5onzas de vino o 1onza de bebidas alcohlicas de alta graduacin.  Concurra a todas las visitas de control como se lo haya indicado el mdico. Esto es importante. SOLICITE ATENCIN MDICA SI:  Su nivel de glucosa en la sangre est por encima del rango indicado en dos mediciones seguidas.  Tiene episodios frecuentes de hiperglucemia. SOLICITE ATENCIN MDICA DE INMEDIATO SI:  Tiene dificultad para respirar.  Tiene cambios en la manera se sentirse, pensar o actuar (estado mental).  Tiene nuseas o vmitos que no desaparecen. Estos sntomas pueden representar un problema que constituye Engineer, maintenance (IT). No espere hasta que los sntomas desaparezcan. Solicite atencin USAA  de inmediato. Comunquese con el servicio de emergencias de su localidad (911 en los [place]). No conduzca por sus propios medios Principal Financial. Esta informacin no tiene Marine scientist el consejo del mdico. Asegrese de hacerle al mdico cualquier pregunta que tenga. Document Released: 09/27/2005 Document Revised: 01/19/2016 Elsevier Interactive Patient Education  2017 [employer].

## 2017-04-22 NOTE — Progress Notes (Signed)
Pharmacy Antibiotic Note  Gregory Moody is a 41 y.o. male admitted on 04/21/2017 with pneumonia.  Pharmacy has been consulted for Zosyn dosing.  Plan: Zosyn 3.375g IV q8h (4 hour infusion).  Height: 5\' 6"  (167.6 cm) Weight: 97 lb (44 kg) IBW/kg (Calculated) : 63.8  Temp (24hrs), Avg:98.1 F (36.7 C), Min:97.9 F (36.6 C), Max:98.2 F (36.8 C)   Recent Labs Lab 04/17/17 1334 04/17/17 1337 04/18/17 0437 04/21/17 2114  WBC 7.3  --  4.6 5.7  CREATININE 0.64  --  0.41* <0.30*  LATICACIDVEN  --  1.3  --   --     CrCl cannot be calculated (This lab value cannot be used to calculate CrCl because it is not a number: <0.30).    No Known Allergies  Antimicrobials this admission: Zosyn 7/13  >>    >>   Dose adjustments this admission:   Microbiology results: 7/12 BCx: pending      7/12 UA: (-) Thank you for allowing pharmacy to be a part of this patient's care.  Voula Waln S 04/22/2017 12:14 AM

## 2017-04-22 NOTE — Progress Notes (Signed)
Initial Nutrition Assessment  DOCUMENTATION CODES:   Severe malnutrition in context of chronic illness  INTERVENTION:   Ensure Enlive po TID between meals, each supplement provides 350 kcal and 20 grams of protein.   Provide daily multivitamin with minerals.  NUTRITION DIAGNOSIS:   Malnutrition (severe) related to chronic illness (Diabetes and tuberculosis ) as evidenced by severe depletion of muscle mass, severe depletion of body fat.  GOAL:   Patient will meet greater than or equal to 90% of their needs  MONITOR:   PO intake, Supplement acceptance, Labs, Weight trends, I & O's  REASON FOR ASSESSMENT:   Other (Comment) (low BMI)    ASSESSMENT:   41 y.o. male with h/o DM, gastroparesis, tuberculosis, hip fracture who presents with episode of lethargy and poor responsiveness. Patient's glucose level was low. Patient states that he was taking his insulin as prescribed, but he did not have very much appetite and was not eating very well. Pt found to have aspiration pneumonia    RD reviewed previous Dietitian's notes from last admission on 7/9.  Per RD note, patient reported that he eats three meals per day plus snacks between meals. For breakfast he eats eggs, bacon, and toast. For lunch and dinner he may eat meat with rice or beans. He likes to snack on Cheetos and cookies. Patient reports he does not have an oral nutrition supplement at home that he drinks, but he enjoys Ensure. He has tried Glucerna in the past but did not tolerate it well as it worsened his diarrhea. Patient reports he has had chronic diarrhea for approximately one year now. He reports it is foul-smelling, floats in toilet, and he can occasionally see oil droplets in toilet (all can be signs of fat malabsorption).   Patient reports his UBW was 140-150 lbs. Over a very short time span a little over one year ago, patient reports he lost approximately 50 lbs (33% body weight). He reports he has remained weight  stable the past year. Per chart, pt has lost 14lbs(13%) over the past year which is not significant.   Per chart, pt documented to be eating 100% of meals currently. Per MD note, pt reports that he hurt his leg several days ago and was laying in bed waiting for someone to feed him so he didn't get up to eat so his blood sugar dropped. Continue to encourage intake of meals and snacks. Pt with hypokalemia; monitor and supplement as needed per MD discretion.   Medications reviewed and include: augmentin, lovenox, insulin, tramadol  Labs reviewed: K 3.3(L), Cl 98(L), creat 0.39(L), Ca 8.6(L) cbgs- 35, 239 x 24 hrs  Nutrition-Focused physical exam completed. Findings are severe fat and  muscle depletions over entire body, and no edema.   Diet Order:  Diet heart healthy/carb modified Room service appropriate? Yes; Fluid consistency: Thin  Skin:  Reviewed, no issues  Last BM:  7/13  Height:   Ht Readings from Last 1 Encounters:  04/21/17 5\' 6"  (1.676 m)    Weight:   Wt Readings from Last 1 Encounters:  04/21/17 97 lb (44 kg)    Ideal Body Weight:  64.5 kg  BMI:  Body mass index is 15.66 kg/m.  Estimated Nutritional Needs:   Kcal:  1750-1950kcal/day   Protein:  79-88g/day   Fluid:  >1.7L/day   EDUCATION NEEDS:   No education needs identified at this time  Koleen Distance MS, RD, [place] Pager #[phone] After Hours Pager: [phone]

## 2017-04-22 NOTE — Progress Notes (Signed)
PHARMACY - PHYSICIAN COMMUNICATION CRITICAL VALUE ALERT - BLOOD CULTURE IDENTIFICATION (BCID)  Results for orders placed or performed during the hospital encounter of 04/21/17  Blood Culture ID Panel (Reflexed) (Collected: 04/21/2017 10:55 PM)  Result Value Ref Range   Enterococcus species NOT DETECTED NOT DETECTED   Listeria monocytogenes NOT DETECTED NOT DETECTED   Staphylococcus species DETECTED (A) NOT DETECTED   Staphylococcus aureus NOT DETECTED NOT DETECTED   Methicillin resistance DETECTED (A) NOT DETECTED   Streptococcus species NOT DETECTED NOT DETECTED   Streptococcus agalactiae NOT DETECTED NOT DETECTED   Streptococcus pneumoniae NOT DETECTED NOT DETECTED   Streptococcus pyogenes NOT DETECTED NOT DETECTED   Acinetobacter baumannii NOT DETECTED NOT DETECTED   Enterobacteriaceae species NOT DETECTED NOT DETECTED   Enterobacter cloacae complex NOT DETECTED NOT DETECTED   Escherichia coli NOT DETECTED NOT DETECTED   Klebsiella oxytoca NOT DETECTED NOT DETECTED   Klebsiella pneumoniae NOT DETECTED NOT DETECTED   Proteus species NOT DETECTED NOT DETECTED   Serratia marcescens NOT DETECTED NOT DETECTED   Haemophilus influenzae NOT DETECTED NOT DETECTED   Neisseria meningitidis NOT DETECTED NOT DETECTED   Pseudomonas aeruginosa NOT DETECTED NOT DETECTED   Candida albicans NOT DETECTED NOT DETECTED   Candida glabrata NOT DETECTED NOT DETECTED   Candida krusei NOT DETECTED NOT DETECTED   Candida parapsilosis NOT DETECTED NOT DETECTED   Candida tropicalis NOT DETECTED NOT DETECTED    Name of physician (or Provider) Contacted: Fritzi Mandes   Changes to prescribed antibiotics required:  No, will continue pt on augmentin, notify MD if more positive blood cx.   Johathan Province D 04/22/2017  4:04 PM

## 2017-04-22 NOTE — Progress Notes (Signed)
Lovenox changed to 30 mg daily for CrCl <30 and TBW <45 kg. 

## 2017-04-22 NOTE — Progress Notes (Signed)
Inpatient Diabetes Program Recommendations  AACE/ADA: New Consensus Statement on Inpatient Glycemic Control (2015)  Target Ranges:  Prepandial:   less than 140 mg/dL      Peak postprandial:   less than 180 mg/dL (1-2 hours)      Critically ill patients:  140 - 180 mg/dL   Lab Results  Component Value Date   GLUCAP 284 (H) 04/22/2017   HGBA1C 16.5 (H) 04/08/2016    Review of Glycemic Control  Results for OCIEL, RETHERFORD (MRN [id number]) as of 04/22/2017 07:56  Ref. Range 04/21/2017 21:02 04/21/2017 21:18 04/22/2017 00:07 04/22/2017 07:21  Glucose-Capillary Latest Ref Range: 65 - 99 mg/dL 32 (LL) 162 (H) 152 (H) 284 (H)     Diabetes history:Type 2 x 10 years (over 200lbs at diagnosis) Outpatient Diabetes medications: Novolin 70/30 70 units bid, Metformin 1000mg  bid (from discharge notes dated 04/19/17)  Current orders for Inpatient glycemic control:   Novolog 0-9 units tid, Novolog 0-5 units qhs  Inpatient Diabetes Program Recommendations: Consider adding Levemir 10 units bid, add Novolog 4 units tid with meals  Gentry Fitz, RN, [place], [place], CDE Diabetes Coordinator Inpatient Diabetes Program  [phone] (Team Pager) [phone] ([place]) 04/22/2017 8:04 AM

## 2017-04-23 NOTE — Progress Notes (Signed)
PHARMACY - PHYSICIAN COMMUNICATION CRITICAL VALUE ALERT - BLOOD CULTURE IDENTIFICATION (BCID)  Results for orders placed or performed during the hospital encounter of 04/21/17  Blood Culture ID Panel (Reflexed) (Collected: 04/21/2017 10:55 PM)  Result Value Ref Range   Enterococcus species NOT DETECTED NOT DETECTED   Listeria monocytogenes NOT DETECTED NOT DETECTED   Staphylococcus species DETECTED (A) NOT DETECTED   Staphylococcus aureus NOT DETECTED NOT DETECTED   Methicillin resistance DETECTED (A) NOT DETECTED   Streptococcus species NOT DETECTED NOT DETECTED   Streptococcus agalactiae NOT DETECTED NOT DETECTED   Streptococcus pneumoniae NOT DETECTED NOT DETECTED   Streptococcus pyogenes NOT DETECTED NOT DETECTED   Acinetobacter baumannii NOT DETECTED NOT DETECTED   Enterobacteriaceae species DETECTED (A) NOT DETECTED   Enterobacter cloacae complex NOT DETECTED NOT DETECTED   Escherichia coli DETECTED (A) NOT DETECTED   Klebsiella oxytoca NOT DETECTED NOT DETECTED   Klebsiella pneumoniae NOT DETECTED NOT DETECTED   Proteus species NOT DETECTED NOT DETECTED   Serratia marcescens NOT DETECTED NOT DETECTED   Carbapenem resistance NOT DETECTED NOT DETECTED   Haemophilus influenzae NOT DETECTED NOT DETECTED   Neisseria meningitidis NOT DETECTED NOT DETECTED   Pseudomonas aeruginosa NOT DETECTED NOT DETECTED   Candida albicans NOT DETECTED NOT DETECTED   Candida glabrata NOT DETECTED NOT DETECTED   Candida krusei NOT DETECTED NOT DETECTED   Candida parapsilosis NOT DETECTED NOT DETECTED   Candida tropicalis NOT DETECTED NOT DETECTED    Name of physician (or Provider) Contacted: [place]  Changes to prescribed antibiotics required: Patient discharged. Info forwarded to discharging MD per hospitalist.  Elfrida Pixley S 04/23/2017  12:59 AM

## 2017-04-24 LAB — CULTURE, BLOOD (ROUTINE X 2): SPECIAL REQUESTS: ADEQUATE

## 2017-04-25 LAB — CULTURE, BLOOD (ROUTINE X 2): Special Requests: ADEQUATE

## 2017-04-25 NOTE — Progress Notes (Signed)
Blood culture/Bug/ Drug mismatch on discharged patient.  Patient discharged with Augmentin for Ecoli; however Ecoli is resistant to Augmentin. Spoke with MD Fritzi Mandes and decided to switch patient to Keflex 500 mg PO TID for 7 days.  Called patient; however was not able to get in touch with patient therefore left a voicemail message with patient and asked him to call @ his earliest convenience.  Larene Beach, PharmD

## 2017-08-12 ENCOUNTER — Emergency Department: Payer: Self-pay

## 2017-08-12 ENCOUNTER — Emergency Department
Admission: EM | Admit: 2017-08-12 | Discharge: 2017-08-12 | Disposition: A | Discharge status: Home / Self Care | Payer: Self-pay | Attending: Emergency Medicine | Admitting: Emergency Medicine

## 2017-08-12 ENCOUNTER — Other Ambulatory Visit: Payer: Self-pay

## 2017-08-12 DIAGNOSIS — E119 Type 2 diabetes mellitus without complications: Secondary | ICD-10-CM | POA: Insufficient documentation

## 2017-08-12 DIAGNOSIS — Z794 Long term (current) use of insulin: Secondary | ICD-10-CM | POA: Insufficient documentation

## 2017-08-12 DIAGNOSIS — K219 Gastro-esophageal reflux disease without esophagitis: Secondary | ICD-10-CM

## 2017-08-12 DIAGNOSIS — R1012 Left upper quadrant pain: Secondary | ICD-10-CM

## 2017-08-12 LAB — CBC
HEMATOCRIT: 34.9 % — AB (ref 40.0–52.0)
Hemoglobin: 12.4 g/dL — ABNORMAL LOW (ref 13.0–18.0)
MCH: 31.8 pg (ref 26.0–34.0)
MCHC: 35.5 g/dL (ref 32.0–36.0)
MCV: 89.5 fL (ref 80.0–100.0)
PLATELETS: 244 10*3/uL (ref 150–440)
RBC: 3.9 MIL/uL — ABNORMAL LOW (ref 4.40–5.90)
RDW: 12.4 % (ref 11.5–14.5)
WBC: 4.6 10*3/uL (ref 3.8–10.6)

## 2017-08-12 LAB — BASIC METABOLIC PANEL
Anion gap: 7 (ref 5–15)
BUN: 12 mg/dL (ref 6–20)
CO2: 29 mmol/L (ref 22–32)
Calcium: 8.9 mg/dL (ref 8.9–10.3)
Chloride: 99 mmol/L — ABNORMAL LOW (ref 101–111)
Creatinine, Ser: 0.52 mg/dL — ABNORMAL LOW (ref 0.61–1.24)
Glucose, Bld: 112 mg/dL — ABNORMAL HIGH (ref 65–99)
POTASSIUM: 2.8 mmol/L — AB (ref 3.5–5.1)
SODIUM: 135 mmol/L (ref 135–145)

## 2017-08-12 LAB — HEPATIC FUNCTION PANEL
ALBUMIN: 3.6 g/dL (ref 3.5–5.0)
ALK PHOS: 87 U/L (ref 38–126)
ALT: 22 U/L (ref 17–63)
AST: 17 U/L (ref 15–41)
Bilirubin, Direct: <0.1 mg/dL — ABNORMAL LOW (ref 0.1–0.5)
TOTAL PROTEIN: 7 g/dL (ref 6.5–8.1)
Total Bilirubin: 0.9 mg/dL (ref 0.3–1.2)

## 2017-08-12 LAB — LIPASE, BLOOD: Lipase: 34 U/L (ref 11–51)

## 2017-08-12 LAB — TROPONIN I: Troponin I: <0.03 ng/mL (ref <0.03)

## 2017-08-12 IMAGING — CR DG CHEST 2V
2 series · 2 of 2 positions shown · non-contrast
Comparison: Most recent radiograph [DATE], most recent CT
[DATE]

CLINICAL DATA: Chest pain.

EXAM:
CHEST  2 VIEW

[chest pa]
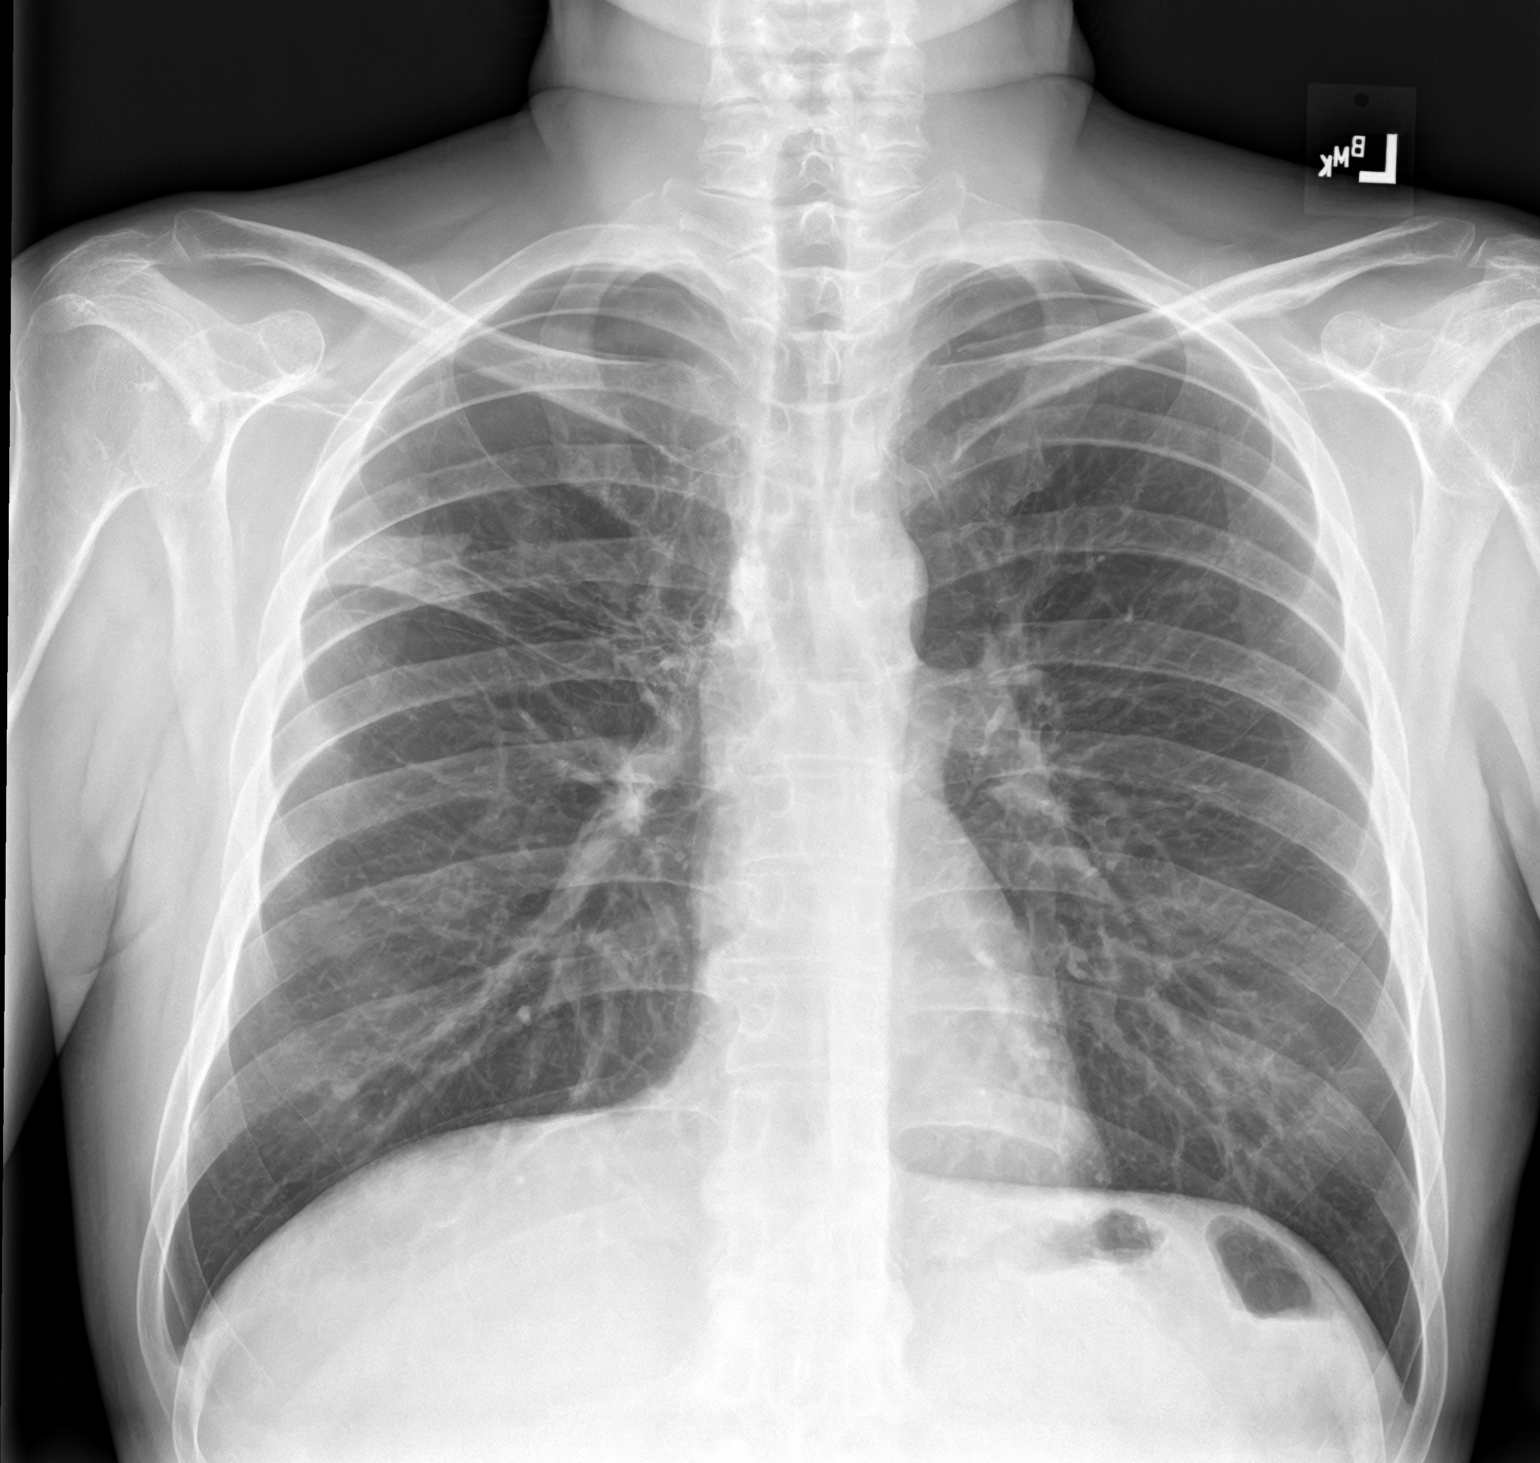

[chest lat]
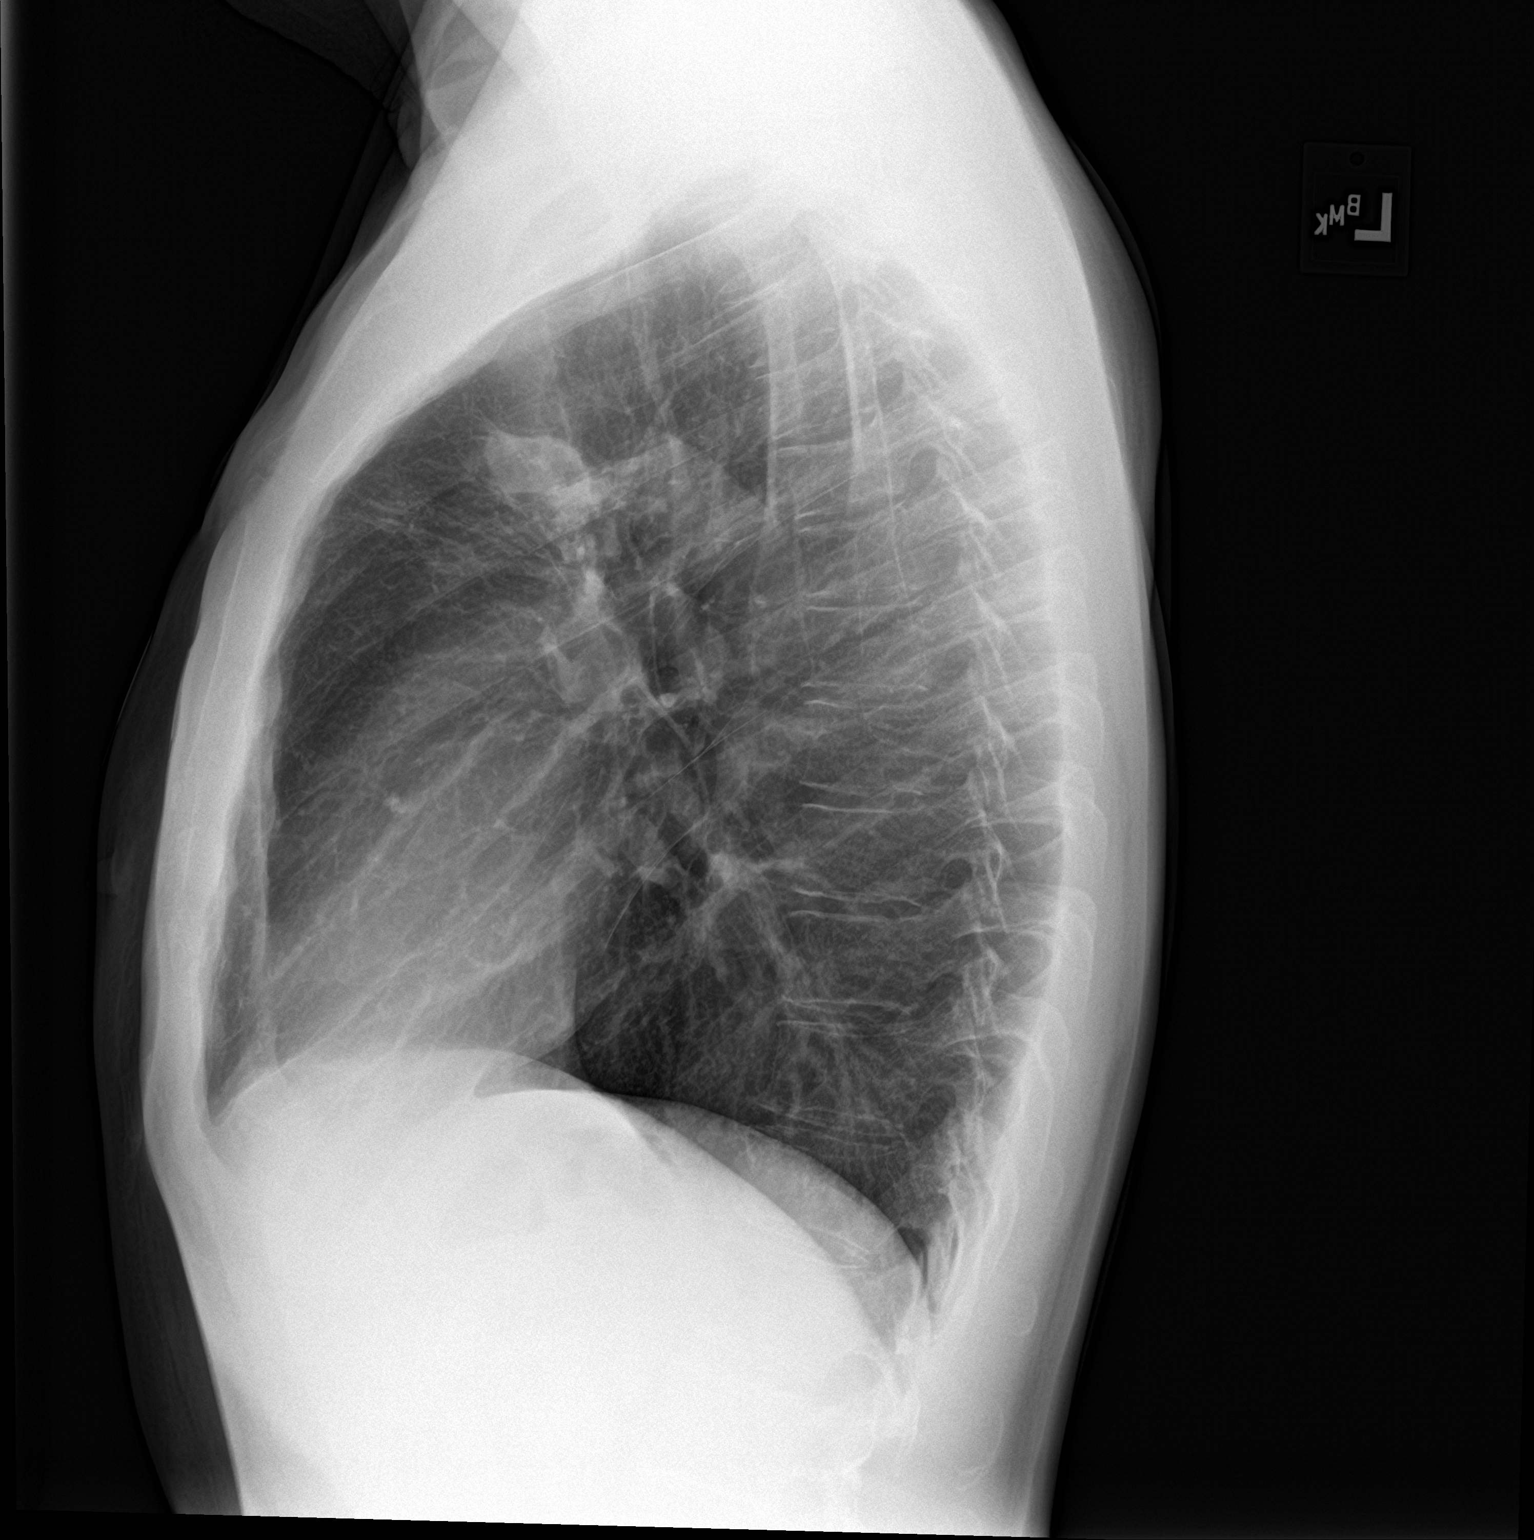

[2 of 2 positions shown; findings below may reference images not displayed]

FINDINGS: Right upper lobe scarring again seen. Previous right lower lobe
consolidation has resolved. No new airspace opacities. Normal heart
size and mediastinal contours. Mild hyperinflation. No pulmonary
edema, pleural fluid or pneumothorax. No acute osseous
abnormalities.
IMPRESSION: Right upper lobe scarring.  No acute abnormality.

## 2017-08-12 MED ORDER — FAMOTIDINE 20 MG PO TABS: 20.0000 mg | ORAL_TABLET | Freq: Two times a day (BID) | ORAL | 0 refills | Status: DC

## 2017-08-12 MED ORDER — METOCLOPRAMIDE HCL 10 MG PO TABS: 10.0000 mg | ORAL_TABLET | Freq: Four times a day (QID) | ORAL | 0 refills | Status: DC | PRN

## 2017-08-12 MED ORDER — ALUMINUM-MAGNESIUM-SIMETHICONE 200-200-20 MG/5ML PO SUSP: 30.0000 mL | Freq: Three times a day (TID) | ORAL | 0 refills | Status: DC

## 2017-08-12 MED ORDER — METOCLOPRAMIDE HCL 10 MG PO TABS
10.0000 mg | ORAL_TABLET | Freq: Once | ORAL | Status: AC
  Administered 2017-08-12: 10 mg via ORAL
  Filled 2017-08-12: qty 1

## 2017-08-12 MED ORDER — POTASSIUM CHLORIDE CRYS ER 20 MEQ PO TBCR
40.0000 meq | EXTENDED_RELEASE_TABLET | Freq: Once | ORAL | Status: AC
  Administered 2017-08-12: 40 meq via ORAL
  Filled 2017-08-12: qty 2

## 2017-08-12 MED ORDER — GI COCKTAIL ~~LOC~~
30.0000 mL | ORAL | Status: AC
  Administered 2017-08-12: 30 mL via ORAL
  Filled 2017-08-12: qty 30

## 2017-08-12 MED ORDER — FAMOTIDINE 20 MG PO TABS
40.0000 mg | ORAL_TABLET | Freq: Once | ORAL | Status: AC
  Administered 2017-08-12: 40 mg via ORAL
  Filled 2017-08-12: qty 2

## 2017-08-12 NOTE — ED Triage Notes (Signed)
Pt c/o central chest pain that started after eating dinner last night. Pt reports he has had same episodes intermittently after eating for approximately 3 months. Pt denies N/V and SOB.

## 2017-08-12 NOTE — ED Provider Notes (Signed)
[facility] Emergency Department Provider Note  ____________________________________________  Time seen: Approximately 11:15 AM  I have reviewed the triage vital signs and the nursing notes.   HISTORY  Chief Complaint Chest Pain    HPI Mavin Dyke is a 41 y.o. male who complains ofcentral chest pain described as burning radiating to the epigastrium that happens after eating dinner last night. Last for a few hours, intermittent, worse with eating, no alleviating factors. Nonradiating. Has moderate intensity.     Past Medical History:  Diagnosis Date  . Diabetes mellitus without complication ([place])   . Gastroparesis   . Tuberculosis      Patient Active Problem List   Diagnosis Date Noted  . Aspiration pneumonia ([place]) 04/21/2017  . Hypoglycemia 04/21/2017  . Diabetes ([place]) 04/21/2017  . Hip fracture, unspecified laterality, closed, initial encounter ([place]) 04/17/2017  . Hyperglycemia 04/17/2017  . Protein-calorie malnutrition, severe 04/09/2016  . Cavitary lesion of lung 04/06/2016     Past Surgical History:  Procedure Laterality Date  . NO PAST SURGERIES       Prior to Admission medications   Medication Sig Start Date End Date Taking? Authorizing Provider  aluminum-magnesium hydroxide-simethicone (MAALOX) [phone] MG/5ML SUSP Take 30 mLs by mouth 4 (four) times daily -  before meals and at bedtime. 08/12/17   Carrie Mew, MD  amoxicillin-clavulanate (AUGMENTIN) 875-125 MG tablet Take 1 tablet by mouth every 12 (twelve) hours. Patient not taking: Reported on 08/12/2017 04/22/17   Epifanio Lesches, MD  famotidine (PEPCID) 20 MG tablet Take 1 tablet (20 mg total) by mouth 2 (two) times daily. 08/12/17   Carrie Mew, MD  feeding supplement, ENSURE ENLIVE, (ENSURE ENLIVE) LIQD Take 237 mLs by mouth 2 (two) times daily with a meal. 04/19/17   Fritzi Mandes, MD  hydrocortisone 2.5 % ointment Apply 1 application topically 2 (two)  times daily as needed for rash.    [provider]  insulin NPH-regular Human (NOVOLIN 70/30) (70-30) 100 UNIT/ML injection Inject 35 Units into the skin 2 (two) times daily with a meal. 04/22/17   Fritzi Mandes, MD  metoCLOPramide (REGLAN) 10 MG tablet Take 1 tablet (10 mg total) by mouth every 6 (six) hours as needed. 08/12/17   Carrie Mew, MD  Multiple Vitamin (MULTIVITAMIN WITH MINERALS) TABS tablet Take 1 tablet by mouth daily. 04/20/17   Fritzi Mandes, MD  traMADol (ULTRAM) 50 MG tablet Take 1 tablet (50 mg total) by mouth every 6 (six) hours as needed for moderate pain. 04/19/17   Fritzi Mandes, MD     Allergies Patient has no known allergies.   Family History  Problem Relation Age of Onset  . Diabetes Mother   . Diabetes Father     Social History Social History  Substance Use Topics  . Smoking status: Never Smoker  . Smokeless tobacco: Never Used  . Alcohol use No    Review of Systems  Constitutional:   No fever or chills.  ENT:   No sore throat. No rhinorrhea. Cardiovascular:   Positive as above for chest pain without syncope. Respiratory:   No dyspnea or cough. Gastrointestinal:   Negative for abdominal pain, vomiting and diarrhea.  Musculoskeletal:   Negative for focal pain or swelling All other systems reviewed and are negative except as documented above in ROS and HPI.  ____________________________________________   PHYSICAL EXAM:  VITAL SIGNS: ED Triage Vitals  Enc Vitals Group     BP 08/12/17 0250 127/69     Pulse Rate 08/12/17  0250 85     Resp 08/12/17 0250 16     Temp 08/12/17 0250 97.7 F (36.5 C)     Temp Source 08/12/17 0250 Oral     SpO2 08/12/17 0250 97 %     Weight 08/12/17 0249 100 lb (45.4 kg)     Height --      Head Circumference --      Peak Flow --      Pain Score --      Pain Loc --      Pain Edu? --      Excl. in [place]? --     Vital signs reviewed, nursing assessments reviewed.   Constitutional:   Alert and oriented.  Well appearing and in no distress. Eyes:   No scleral icterus.  EOMI. No nystagmus. No conjunctival pallor. PERRL. ENT   Head:   Normocephalic and atraumatic.   Nose:   No congestion/rhinnorhea.    Mouth/Throat:   MMM, no pharyngeal erythema. No peritonsillar mass.    Neck:   No meningismus. Full ROM. Hematological/Lymphatic/Immunilogical:   No cervical lymphadenopathy. Cardiovascular:   RRR. Symmetric bilateral radial and DP pulses.  No murmurs.  Respiratory:   Normal respiratory effort without tachypnea/retractions. Breath sounds are clear and equal bilaterally. No wheezes/rales/rhonchi. Gastrointestinal:   Soft with mild left upper quadrant tenderness. Non distended. There is no CVA tenderness.  No rebound, rigidity, or guarding. Genitourinary:   deferred Musculoskeletal:   Normal range of motion in all extremities. No joint effusions.  No lower extremity tenderness.  No edema. Chest wall nontender Neurologic:   Normal speech and language.  Motor grossly intact. No gross focal neurologic deficits are appreciated.  Skin:    Skin is warm, dry and intact. No rash noted.  No petechiae, purpura, or bullae.  ____________________________________________    LABS (pertinent positives/negatives) (all labs ordered are listed, but only abnormal results are displayed) Labs Reviewed  BASIC METABOLIC PANEL - Abnormal; Notable for the following:       Result Value   Potassium 2.8 (*)    Chloride 99 (*)    Glucose, Bld 112 (*)    Creatinine, Ser 0.52 (*)    All other components within normal limits  CBC - Abnormal; Notable for the following:    RBC 3.90 (*)    Hemoglobin 12.4 (*)    HCT 34.9 (*)    All other components within normal limits  HEPATIC FUNCTION PANEL - Abnormal; Notable for the following:    Bilirubin, Direct <0.1 (*)    All other components within normal limits  TROPONIN I  LIPASE, BLOOD  TROPONIN I    ____________________________________________   EKG  Interpreted by me Sinus rhythm rate of 77, right axis, normal intervals. Normal QRS ST segments and T waves  ____________________________________________    RADIOLOGY  Dg Chest 2 View  Result Date: 08/12/2017 CLINICAL DATA:  Chest pain. EXAM: CHEST  2 VIEW COMPARISON:  Most recent radiograph 04/21/2017, most recent CT 04/06/2016 FINDINGS: Right upper lobe scarring again seen. Previous right lower lobe consolidation has resolved. No new airspace opacities. Normal heart size and mediastinal contours. Mild hyperinflation. No pulmonary edema, pleural fluid or pneumothorax. No acute osseous abnormalities. IMPRESSION: Right upper lobe scarring.  No acute abnormality. Electronically Signed   By: Jeb Levering M.D.   On: 08/12/2017 04:22    ____________________________________________   PROCEDURES Procedures  ____________________________________________   DIFFERENTIAL DIAGNOSIS  GERD, gastritis, pneumothorax, pneumonia, peptic ulcer disease, hepatitis, pancreatitis, cholecystitis  CLINICAL IMPRESSION / ASSESSMENT AND PLAN / ED COURSE  Pertinent labs & imaging results that were available during my care of the patient were reviewed by me and considered in my medical decision making (see chart for details).   Patient well-appearing no acute distress, complains of left upper abdominal pain, worsened after eating, for the past 3 months. Currently pain free and hungry and wants to eat. No nausea vomiting, no shortness of breath. No back pain. Overall very reassuring exam, labs including LFTs and lipase unremarkable. Delta troponin negative. Likely GERD, follow up with GI, acid suppression therapy for now.  Considering the patient's symptoms, medical history, and physical examination today, I have low suspicion for ACS, PE, TAD, pneumothorax, carditis, mediastinitis, pneumonia, CHF, or sepsis.         ____________________________________________   FINAL CLINICAL IMPRESSION(S) / ED DIAGNOSES    Final diagnoses:  LUQ pain  Gastroesophageal reflux disease, esophagitis presence not specified      New Prescriptions   ALUMINUM-MAGNESIUM HYDROXIDE-SIMETHICONE (MAALOX) [phone] MG/5ML SUSP    Take 30 mLs by mouth 4 (four) times daily -  before meals and at bedtime.   FAMOTIDINE (PEPCID) 20 MG TABLET    Take 1 tablet (20 mg total) by mouth 2 (two) times daily.   METOCLOPRAMIDE (REGLAN) 10 MG TABLET    Take 1 tablet (10 mg total) by mouth every 6 (six) hours as needed.     Portions of this note were generated with dragon dictation software. Dictation errors may occur despite best attempts at proofreading.    Carrie Mew, MD 08/12/17 1119

## 2017-08-12 NOTE — ED Notes (Signed)
Signature pad not working in room, pt states discharge understanding, denies any questions

## 2017-08-12 NOTE — ED Notes (Signed)
Pt given food tray and juice

## 2018-02-05 ENCOUNTER — Emergency Department: Payer: Self-pay

## 2018-02-05 ENCOUNTER — Other Ambulatory Visit: Payer: Self-pay

## 2018-02-05 ENCOUNTER — Emergency Department
Admission: EM | Admit: 2018-02-05 | Discharge: 2018-02-06 | Disposition: A | Discharge status: Home / Self Care | Payer: Self-pay | Attending: Emergency Medicine | Admitting: Emergency Medicine

## 2018-02-05 ENCOUNTER — Encounter: Payer: Self-pay | Admitting: *Deleted

## 2018-02-05 DIAGNOSIS — Y9389 Activity, other specified: Secondary | ICD-10-CM | POA: Insufficient documentation

## 2018-02-05 DIAGNOSIS — S27818A Other injury of esophagus (thoracic part), initial encounter: Secondary | ICD-10-CM

## 2018-02-05 DIAGNOSIS — R739 Hyperglycemia, unspecified: Secondary | ICD-10-CM

## 2018-02-05 DIAGNOSIS — X58XXXA Exposure to other specified factors, initial encounter: Secondary | ICD-10-CM | POA: Insufficient documentation

## 2018-02-05 DIAGNOSIS — Z794 Long term (current) use of insulin: Secondary | ICD-10-CM | POA: Insufficient documentation

## 2018-02-05 DIAGNOSIS — Y929 Unspecified place or not applicable: Secondary | ICD-10-CM | POA: Insufficient documentation

## 2018-02-05 DIAGNOSIS — E1165 Type 2 diabetes mellitus with hyperglycemia: Secondary | ICD-10-CM | POA: Insufficient documentation

## 2018-02-05 DIAGNOSIS — S1011XA Abrasion of throat, initial encounter: Secondary | ICD-10-CM | POA: Insufficient documentation

## 2018-02-05 DIAGNOSIS — Y999 Unspecified external cause status: Secondary | ICD-10-CM | POA: Insufficient documentation

## 2018-02-05 LAB — GLUCOSE, CAPILLARY
GLUCOSE-CAPILLARY: 305 mg/dL — AB (ref 65–99)
Glucose-Capillary: 446 mg/dL — ABNORMAL HIGH (ref 65–99)

## 2018-02-05 LAB — CBC
HCT: 34.8 % — ABNORMAL LOW (ref 40.0–52.0)
HEMOGLOBIN: 12.3 g/dL — AB (ref 13.0–18.0)
MCH: 32.2 pg (ref 26.0–34.0)
MCHC: 35.3 g/dL (ref 32.0–36.0)
MCV: 91.4 fL (ref 80.0–100.0)
Platelets: 248 10*3/uL (ref 150–440)
RBC: 3.81 MIL/uL — AB (ref 4.40–5.90)
RDW: 12.2 % (ref 11.5–14.5)
WBC: 5.7 10*3/uL (ref 3.8–10.6)

## 2018-02-05 LAB — BASIC METABOLIC PANEL
ANION GAP: 9 (ref 5–15)
BUN: 16 mg/dL (ref 6–20)
CHLORIDE: 85 mmol/L — AB (ref 101–111)
CO2: 27 mmol/L (ref 22–32)
Calcium: 9.1 mg/dL (ref 8.9–10.3)
Creatinine, Ser: 1.33 mg/dL — ABNORMAL HIGH (ref 0.61–1.24)
GFR calc Af Amer: >60 mL/min (ref >60)
GFR calc non Af Amer: >60 mL/min (ref >60)
GLUCOSE: 559 mg/dL — AB (ref 65–99)
POTASSIUM: 4.2 mmol/L (ref 3.5–5.1)
Sodium: 121 mmol/L — ABNORMAL LOW (ref 135–145)

## 2018-02-05 LAB — TROPONIN I

## 2018-02-05 IMAGING — CR DG CHEST 2V
1 series · 2 of 2 positions shown · non-contrast
Comparison: [DATE]

CLINICAL DATA: Difficulty swallowing after eating a taco at noon
today. Feels like it is stuck. Similar symptoms 3 months ago.

EXAM:
CHEST - 2 VIEW

[Series 1: dg chest 2 view · 0.14mm/px · 2 of 2 slices shown]
[im 1/2]
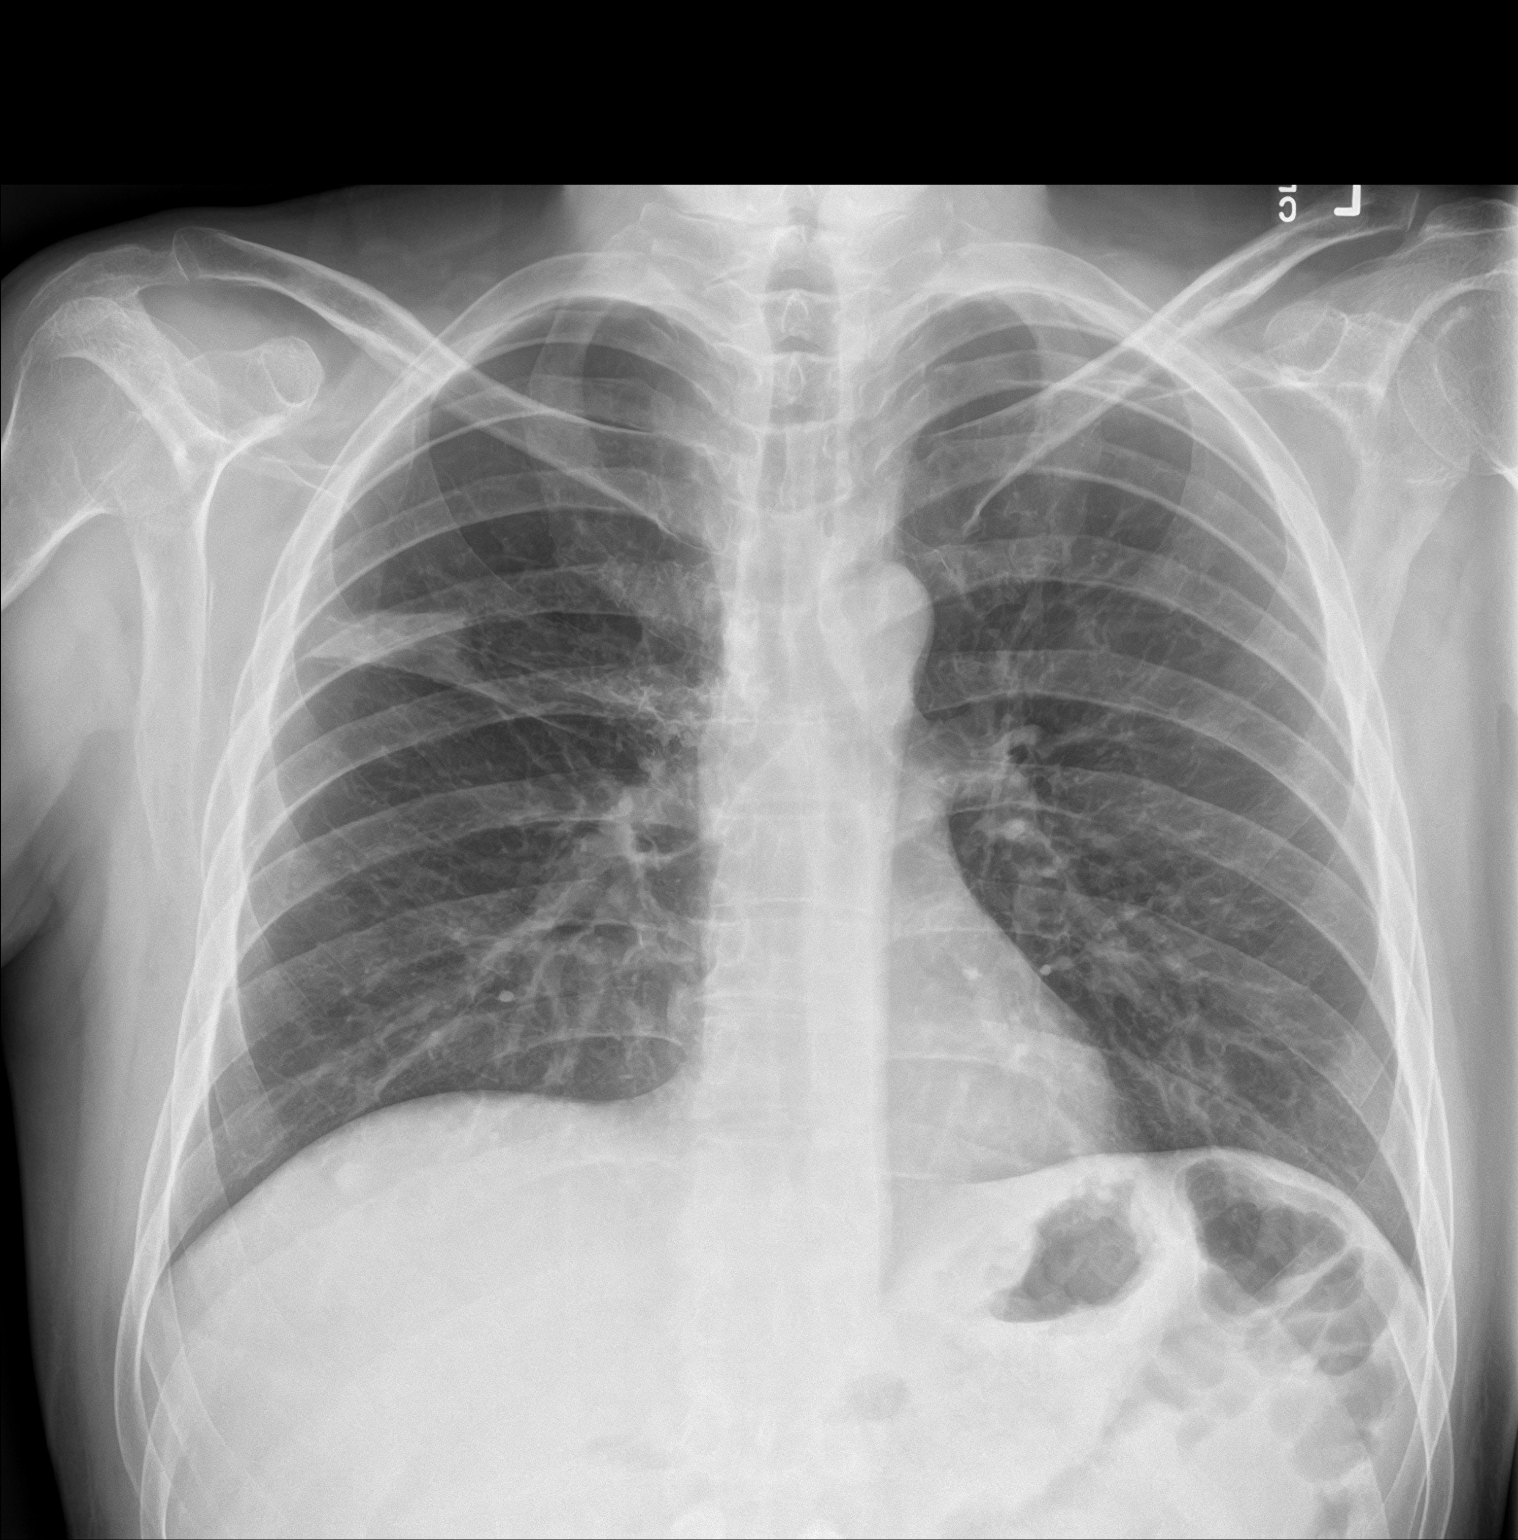
[im 2/2]
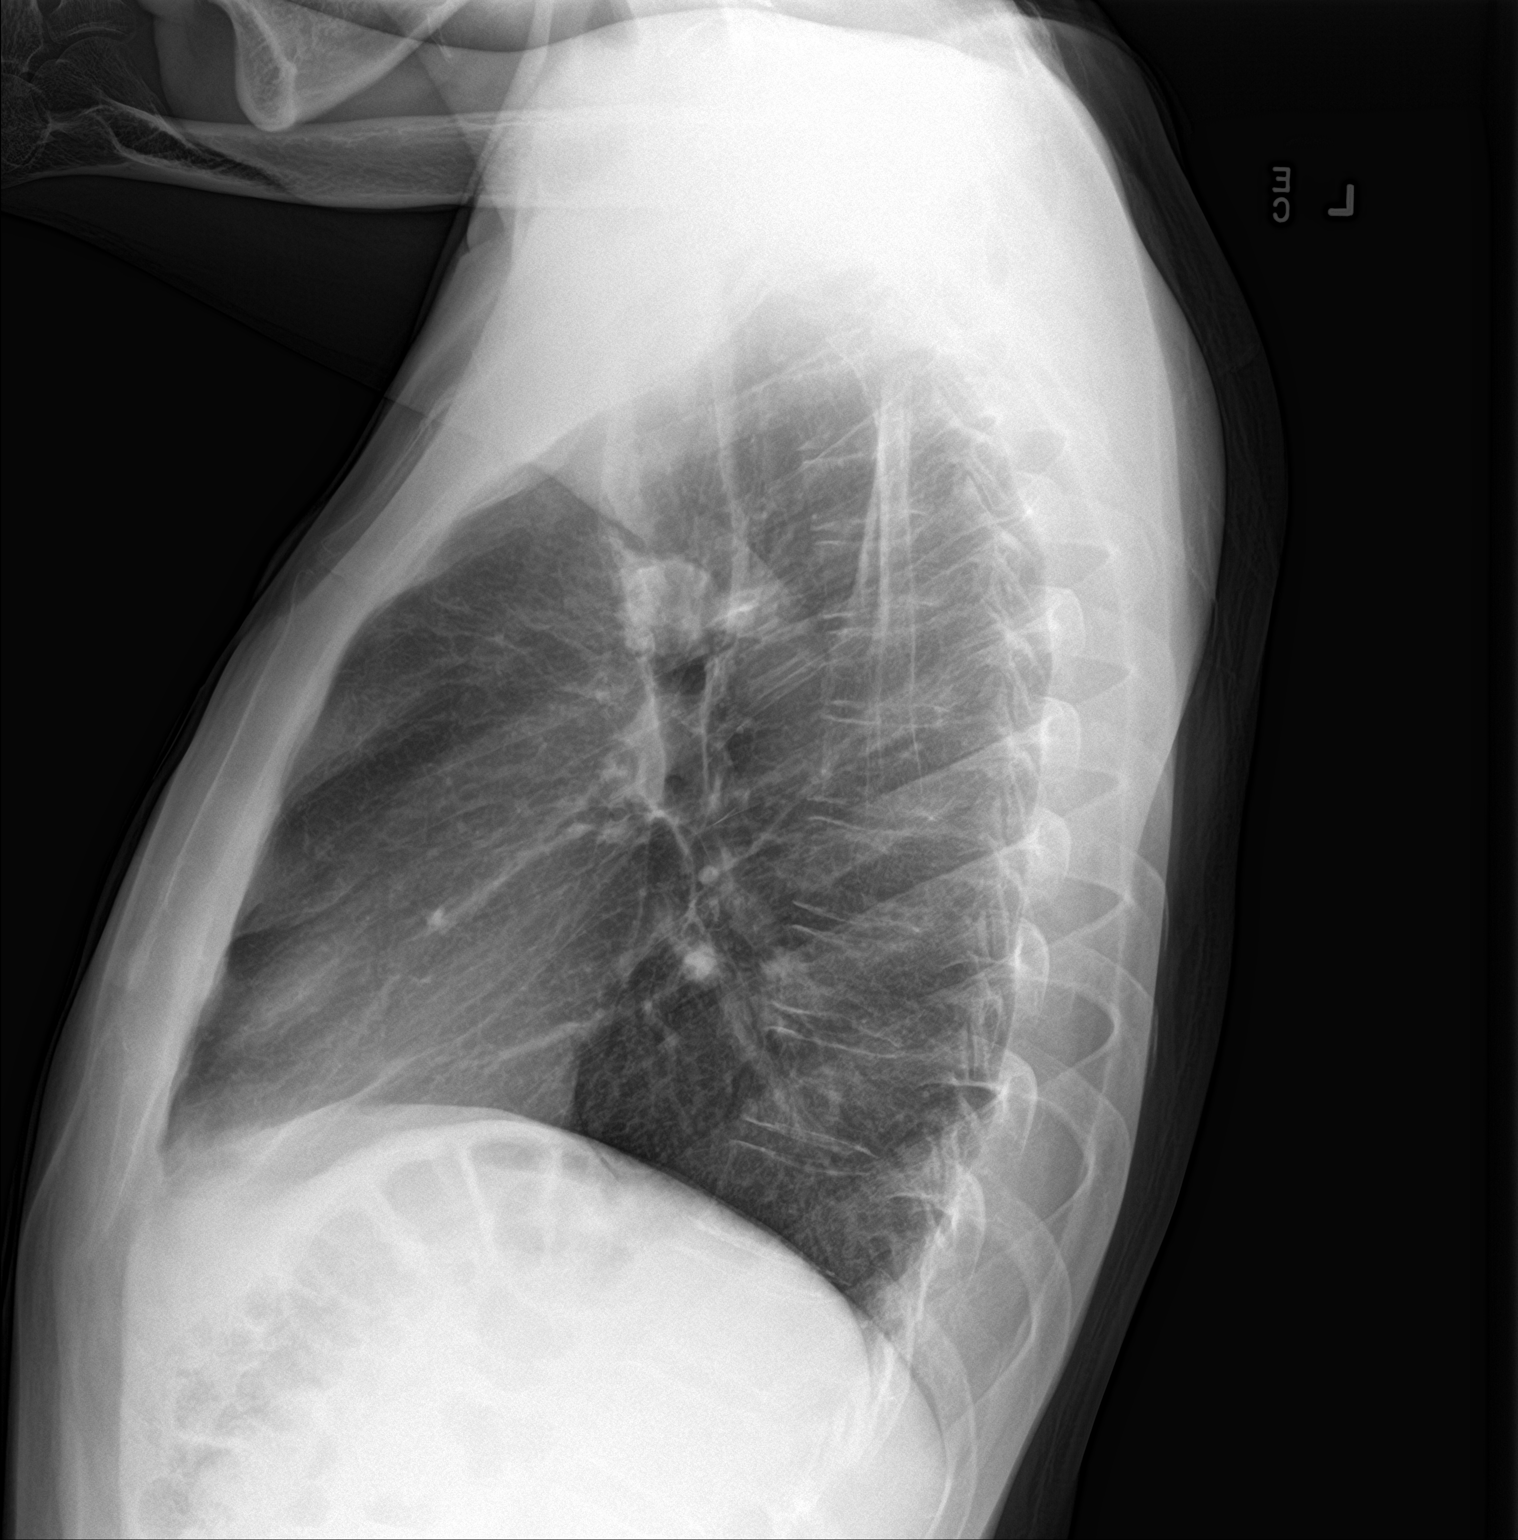

[2 of 2 positions shown; findings below may reference images not displayed]

FINDINGS: Linear scarring in the right upper lung is unchanged since previous
study. Peribronchial thickening and central interstitial pattern
suggesting chronic bronchitis. No airspace disease or consolidation
the lungs. No blunting of costophrenic angles. No pneumothorax. No
mediastinal gas. Heart size and pulmonary vascularity are normal. No
radiopaque foreign bodies.
IMPRESSION: Persistent scarring in the right upper lung without change since
previous study. Chronic bronchitic changes. No evidence of active
pulmonary disease.

## 2018-02-05 MED ORDER — GI COCKTAIL ~~LOC~~
30.0000 mL | Freq: Once | ORAL | Status: AC
Start: 2018-02-05 — End: 2018-02-05
  Administered 2018-02-05: 30 mL via ORAL

## 2018-02-05 MED ORDER — INSULIN ASPART 100 UNIT/ML ~~LOC~~ SOLN
10.0000 [IU] | Freq: Once | SUBCUTANEOUS | Status: AC
  Administered 2018-02-05: 10 [IU] via INTRAVENOUS
  Filled 2018-02-05: qty 1

## 2018-02-05 MED ORDER — GI COCKTAIL ~~LOC~~
ORAL | Status: AC
  Filled 2018-02-05: qty 30

## 2018-02-05 MED ORDER — SODIUM CHLORIDE 0.9 % IV BOLUS
1000.0000 mL | Freq: Once | INTRAVENOUS | Status: AC
  Administered 2018-02-05: 1000 mL via INTRAVENOUS

## 2018-02-05 NOTE — ED Provider Notes (Signed)
Signout from Dr. Kerman Passey in this 42 year old male who presented with chest pain after swallowing a piece of hard taco.  Patient is pain-free but needs to have a glucose recheck after fluids and insulin.  Patient did not take his insulin prior to arrival today.  Physical Exam  BP 100/64 (BP Location: Left Arm)   Pulse 89   Temp 99.2 F (37.3 C) (Oral)   Resp 20   Ht 5\' 8"  (1.727 m)   Wt 56.7 kg (125 lb)   SpO2 99%   BMI 19.01 kg/m   Physical Exam Patient resting comfortably without any distress at this time. ED Course/Procedures     Procedures  MDM  Patient remains pain-free.  Glucose down to 305.  Will be discharged at this time.  Patient understanding of the treatment plan as well as follow-up with his primary care doctor at [facility] and willing to comply.       Orbie Pyo, MD 02/05/18 [phone]

## 2018-02-05 NOTE — ED Triage Notes (Signed)
Pt reports diff swallowing after eating a taco at noon today.  Pt states it feels like it is stuck.  Pt is able to drink water.  Pt states similar sx 3 months ago.  Pt alert  Speech clear.

## 2018-02-05 NOTE — Discharge Instructions (Signed)
Please call the number provided for GI medicine to arrange a follow-up appointment to discuss possible endoscopy, if deemed necessary.  Return to the emergency department for any further chest pain, if you develop any trouble breathing, or any other symptoms personally concerning for yourself.  Please take your blood sugar and dose your insulin appropriately, drink plenty of non-sugary fluids.

## 2018-02-05 NOTE — ED Provider Notes (Signed)
[facility] Emergency Department Provider Note  Time seen: 9:10 PM  I have reviewed the triage vital signs and the nursing notes.   HISTORY  Chief Complaint Dysphagia    HPI Gregory Moody is a 42 y.o. male with a past medical history of diabetes, presents to the emergency department for chest pain.  According to the patient he was eating tacos at 12 PM when he felt like a taco shell got stuck in his esophagus.  States he has been having pain in this area ever since.  States he has been able to eat and drink food without it getting stuck but he continues to have pain in this area.  Patient states this occurred 3 months ago as well but resolved on its own did not require intervention is never had an endoscopy or seen a GI physician.  Describes the pain as mild sharp type pain located in the center of his chest.   Past Medical History:  Diagnosis Date  . Diabetes mellitus without complication ([place])   . Gastroparesis   . Tuberculosis     Patient Active Problem List   Diagnosis Date Noted  . Aspiration pneumonia ([place]) 04/21/2017  . Hypoglycemia 04/21/2017  . Diabetes ([place]) 04/21/2017  . Hip fracture, unspecified laterality, closed, initial encounter ([place]) 04/17/2017  . Hyperglycemia 04/17/2017  . Protein-calorie malnutrition, severe 04/09/2016  . Cavitary lesion of lung 04/06/2016    Past Surgical History:  Procedure Laterality Date  . NO PAST SURGERIES      Prior to Admission medications   Medication Sig Start Date End Date Taking? Authorizing Provider  aluminum-magnesium hydroxide-simethicone (MAALOX) [phone] MG/5ML SUSP Take 30 mLs by mouth 4 (four) times daily -  before meals and at bedtime. 08/12/17   Carrie Mew, MD  amoxicillin-clavulanate (AUGMENTIN) 875-125 MG tablet Take 1 tablet by mouth every 12 (twelve) hours. Patient not taking: Reported on 08/12/2017 04/22/17   Epifanio Lesches, MD  famotidine (PEPCID) 20 MG tablet Take  1 tablet (20 mg total) by mouth 2 (two) times daily. 08/12/17   Carrie Mew, MD  feeding supplement, ENSURE ENLIVE, (ENSURE ENLIVE) LIQD Take 237 mLs by mouth 2 (two) times daily with a meal. 04/19/17   Fritzi Mandes, MD  hydrocortisone 2.5 % ointment Apply 1 application topically 2 (two) times daily as needed for rash.    [provider]  insulin NPH-regular Human (NOVOLIN 70/30) (70-30) 100 UNIT/ML injection Inject 35 Units into the skin 2 (two) times daily with a meal. 04/22/17   Fritzi Mandes, MD  metoCLOPramide (REGLAN) 10 MG tablet Take 1 tablet (10 mg total) by mouth every 6 (six) hours as needed. 08/12/17   Carrie Mew, MD  Multiple Vitamin (MULTIVITAMIN WITH MINERALS) TABS tablet Take 1 tablet by mouth daily. 04/20/17   Fritzi Mandes, MD  traMADol (ULTRAM) 50 MG tablet Take 1 tablet (50 mg total) by mouth every 6 (six) hours as needed for moderate pain. 04/19/17   Fritzi Mandes, MD    No Known Allergies  Family History  Problem Relation Age of Onset  . Diabetes Mother   . Diabetes Father     Social History Social History   Tobacco Use  . Smoking status: Never Smoker  . Smokeless tobacco: Never Used  Substance Use Topics  . Alcohol use: No  . Drug use: No    Review of Systems Constitutional: Negative for fever. Cardiovascular: Central chest pain Respiratory: Negative for shortness of breath. Gastrointestinal: Negative for abdominal pain, vomiting  Genitourinary: Negative for urinary compaints Musculoskeletal: Negative for leg pain or swelling All other ROS negative  ____________________________________________   PHYSICAL EXAM:  VITAL SIGNS: ED Triage Vitals  Enc Vitals Group     BP 02/05/18 2032 100/64     Pulse Rate 02/05/18 2032 89     Resp 02/05/18 2032 20     Temp 02/05/18 2032 99.2 F (37.3 C)     Temp Source 02/05/18 2032 Oral     SpO2 02/05/18 2032 99 %     Weight 02/05/18 2033 125 lb (56.7 kg)     Height 02/05/18 2033 5\' 8"  (1.727 m)      Head Circumference --      Peak Flow --      Pain Score 02/05/18 2033 0     Pain Loc --      Pain Edu? --      Excl. in [place]? --     Constitutional: Alert and oriented. Well appearing and in no distress. Eyes: Normal exam ENT   Head: Normocephalic and atraumatic   Mouth/Throat: Mucous membranes are moist. Cardiovascular: Normal rate, regular rhythm. No murmur Respiratory: Normal respiratory effort without tachypnea nor retractions. Breath sounds are clear.  Mild chest tenderness to palpation. Gastrointestinal: Soft and nontender. No distention.  No epigastric tenderness. Musculoskeletal: Nontender with normal range of motion in all extremities.  Neurologic:  Normal speech and language. No gross focal neurologic deficits  Skin:  Skin is warm, dry and intact.  Psychiatric: Mood and affect are normal.   ____________________________________________    EKG  EKG reviewed and interpreted by myself shows normal sinus rhythm at 72 bpm with a narrow QRS, normal axis, normal intervals, no concerning ST changes.  ____________________________________________    RADIOLOGY  X-ray negative  ____________________________________________   INITIAL IMPRESSION / ASSESSMENT AND PLAN / ED COURSE  Pertinent labs & imaging results that were available during my care of the patient were reviewed by me and considered in my medical decision making (see chart for details).  Patient presents to the emergency department for pain in his chest after eating a taco shell.  Differential would include esophageal abrasion, foreign body impaction, gastric reflux, gastritis or esophagitis, esophageal spasm, ACS.  We will check basic labs including cardiac enzymes, will obtain an x-ray and EKG as a precaution.  We will dose a GI cocktail and continue to closely monitor.  Patient is able to drink and eat, highly suspect esophageal abrasion to be the cause of the patient's discomfort.  Patient's work-up is  largely negative.  X-ray is negative, EKG is normal.  Troponin negative however his blood work shows his glucose is elevated 559, but a normal anion gap.  I discussed this with the patient and he states he did not take his insulin today, states he has it at home but was just lazy today and did not take it.  We will dose IV insulin, IV fluids and continue to closely monitor with a goal of getting his glucose down prior to discharge.  With the patient's medical work-up otherwise largely nonrevealing I believe the patient will be safe for discharge home with GI follow-up for consideration of endoscopy.  Patient states his pain is gone after GI cocktail, has been able to eat and drink today without issue besides the discomfort.  Patient's blood glucose currently 440.  I will sign the patient out to oncoming physician to continue to closely monitor receiving IV fluids currently. ____________________________________________   FINAL CLINICAL IMPRESSION(S) /  ED DIAGNOSES  esophageal abrasion Hyperglycemia    Harvest Dark, MD 02/05/18 2241

## 2018-02-22 ENCOUNTER — Observation Stay
Admission: EM | Admit: 2018-02-22 | Discharge: 2018-02-24 | Disposition: A | Discharge status: Home / Self Care | Payer: Self-pay | Attending: Internal Medicine | Admitting: Internal Medicine

## 2018-02-22 ENCOUNTER — Emergency Department: Payer: Self-pay

## 2018-02-22 DIAGNOSIS — E871 Hypo-osmolality and hyponatremia: Secondary | ICD-10-CM | POA: Insufficient documentation

## 2018-02-22 DIAGNOSIS — Z79899 Other long term (current) drug therapy: Secondary | ICD-10-CM | POA: Insufficient documentation

## 2018-02-22 DIAGNOSIS — L97511 Non-pressure chronic ulcer of other part of right foot limited to breakdown of skin: Secondary | ICD-10-CM

## 2018-02-22 DIAGNOSIS — E1065 Type 1 diabetes mellitus with hyperglycemia: Secondary | ICD-10-CM | POA: Insufficient documentation

## 2018-02-22 DIAGNOSIS — L97523 Non-pressure chronic ulcer of other part of left foot with necrosis of muscle: Secondary | ICD-10-CM | POA: Insufficient documentation

## 2018-02-22 DIAGNOSIS — Z23 Encounter for immunization: Secondary | ICD-10-CM | POA: Insufficient documentation

## 2018-02-22 DIAGNOSIS — L97521 Non-pressure chronic ulcer of other part of left foot limited to breakdown of skin: Secondary | ICD-10-CM

## 2018-02-22 DIAGNOSIS — E11621 Type 2 diabetes mellitus with foot ulcer: Secondary | ICD-10-CM

## 2018-02-22 DIAGNOSIS — Z794 Long term (current) use of insulin: Secondary | ICD-10-CM | POA: Insufficient documentation

## 2018-02-22 DIAGNOSIS — E1143 Type 2 diabetes mellitus with diabetic autonomic (poly)neuropathy: Secondary | ICD-10-CM | POA: Diagnosis present

## 2018-02-22 DIAGNOSIS — Z8611 Personal history of tuberculosis: Secondary | ICD-10-CM | POA: Insufficient documentation

## 2018-02-22 DIAGNOSIS — E440 Moderate protein-calorie malnutrition: Secondary | ICD-10-CM | POA: Insufficient documentation

## 2018-02-22 DIAGNOSIS — L97513 Non-pressure chronic ulcer of other part of right foot with necrosis of muscle: Secondary | ICD-10-CM | POA: Insufficient documentation

## 2018-02-22 DIAGNOSIS — K3184 Gastroparesis: Secondary | ICD-10-CM | POA: Insufficient documentation

## 2018-02-22 DIAGNOSIS — E1043 Type 1 diabetes mellitus with diabetic autonomic (poly)neuropathy: Secondary | ICD-10-CM | POA: Insufficient documentation

## 2018-02-22 DIAGNOSIS — K219 Gastro-esophageal reflux disease without esophagitis: Secondary | ICD-10-CM | POA: Insufficient documentation

## 2018-02-22 DIAGNOSIS — R739 Hyperglycemia, unspecified: Secondary | ICD-10-CM

## 2018-02-22 DIAGNOSIS — R030 Elevated blood-pressure reading, without diagnosis of hypertension: Secondary | ICD-10-CM | POA: Insufficient documentation

## 2018-02-22 DIAGNOSIS — E10621 Type 1 diabetes mellitus with foot ulcer: Principal | ICD-10-CM | POA: Insufficient documentation

## 2018-02-22 DIAGNOSIS — E1165 Type 2 diabetes mellitus with hyperglycemia: Secondary | ICD-10-CM | POA: Diagnosis present

## 2018-02-22 DIAGNOSIS — Z681 Body mass index (BMI) 19 or less, adult: Secondary | ICD-10-CM | POA: Insufficient documentation

## 2018-02-22 DIAGNOSIS — L97509 Non-pressure chronic ulcer of other part of unspecified foot with unspecified severity: Secondary | ICD-10-CM

## 2018-02-22 LAB — COMPREHENSIVE METABOLIC PANEL
ALBUMIN: 3.3 g/dL — AB (ref 3.5–5.0)
ALT: 9 U/L — AB (ref 17–63)
ANION GAP: 14 (ref 5–15)
AST: 15 U/L (ref 15–41)
Alkaline Phosphatase: 102 U/L (ref 38–126)
BILIRUBIN TOTAL: 0.7 mg/dL (ref 0.3–1.2)
BUN: 7 mg/dL (ref 6–20)
CHLORIDE: 87 mmol/L — AB (ref 101–111)
CO2: 27 mmol/L (ref 22–32)
Calcium: 8.5 mg/dL — ABNORMAL LOW (ref 8.9–10.3)
Creatinine, Ser: 0.99 mg/dL (ref 0.61–1.24)
GFR calc Af Amer: >60 mL/min (ref >60)
GLUCOSE: 620 mg/dL — AB (ref 65–99)
Potassium: 2.9 mmol/L — ABNORMAL LOW (ref 3.5–5.1)
Sodium: 128 mmol/L — ABNORMAL LOW (ref 135–145)
TOTAL PROTEIN: 7.4 g/dL (ref 6.5–8.1)

## 2018-02-22 LAB — CBC WITH DIFFERENTIAL/PLATELET
BASOS ABS: 0 10*3/uL (ref 0–0.1)
Basophils Relative: 0 %
Eosinophils Absolute: 0 10*3/uL (ref 0–0.7)
Eosinophils Relative: 0 %
HCT: 29.2 % — ABNORMAL LOW (ref 40.0–52.0)
Hemoglobin: 10 g/dL — ABNORMAL LOW (ref 13.0–18.0)
LYMPHS ABS: 1.2 10*3/uL (ref 1.0–3.6)
Lymphocytes Relative: 16 %
MCH: 31.5 pg (ref 26.0–34.0)
MCHC: 34.2 g/dL (ref 32.0–36.0)
MCV: 92 fL (ref 80.0–100.0)
MONO ABS: 0.4 10*3/uL (ref 0.2–1.0)
Monocytes Relative: 5 %
NEUTROS ABS: 6 10*3/uL (ref 1.4–6.5)
Neutrophils Relative %: 79 %
Platelets: 427 10*3/uL (ref 150–440)
RBC: 3.17 MIL/uL — AB (ref 4.40–5.90)
RDW: 12.2 % (ref 11.5–14.5)
WBC: 7.6 10*3/uL (ref 3.8–10.6)

## 2018-02-22 LAB — GLUCOSE, CAPILLARY
Glucose-Capillary: >600 mg/dL (ref 65–99)
Glucose-Capillary: 353 mg/dL — ABNORMAL HIGH (ref 65–99)

## 2018-02-22 IMAGING — DX DG FOOT COMPLETE 3+V*R*
3 series · 3 of 3 positions shown · non-contrast
Comparison: None.

CLINICAL DATA: Wounds to the great toes bilaterally for 1 week.
History of diabetes.

EXAM:
RIGHT FOOT COMPLETE - 3+ VIEW

[foot ap]
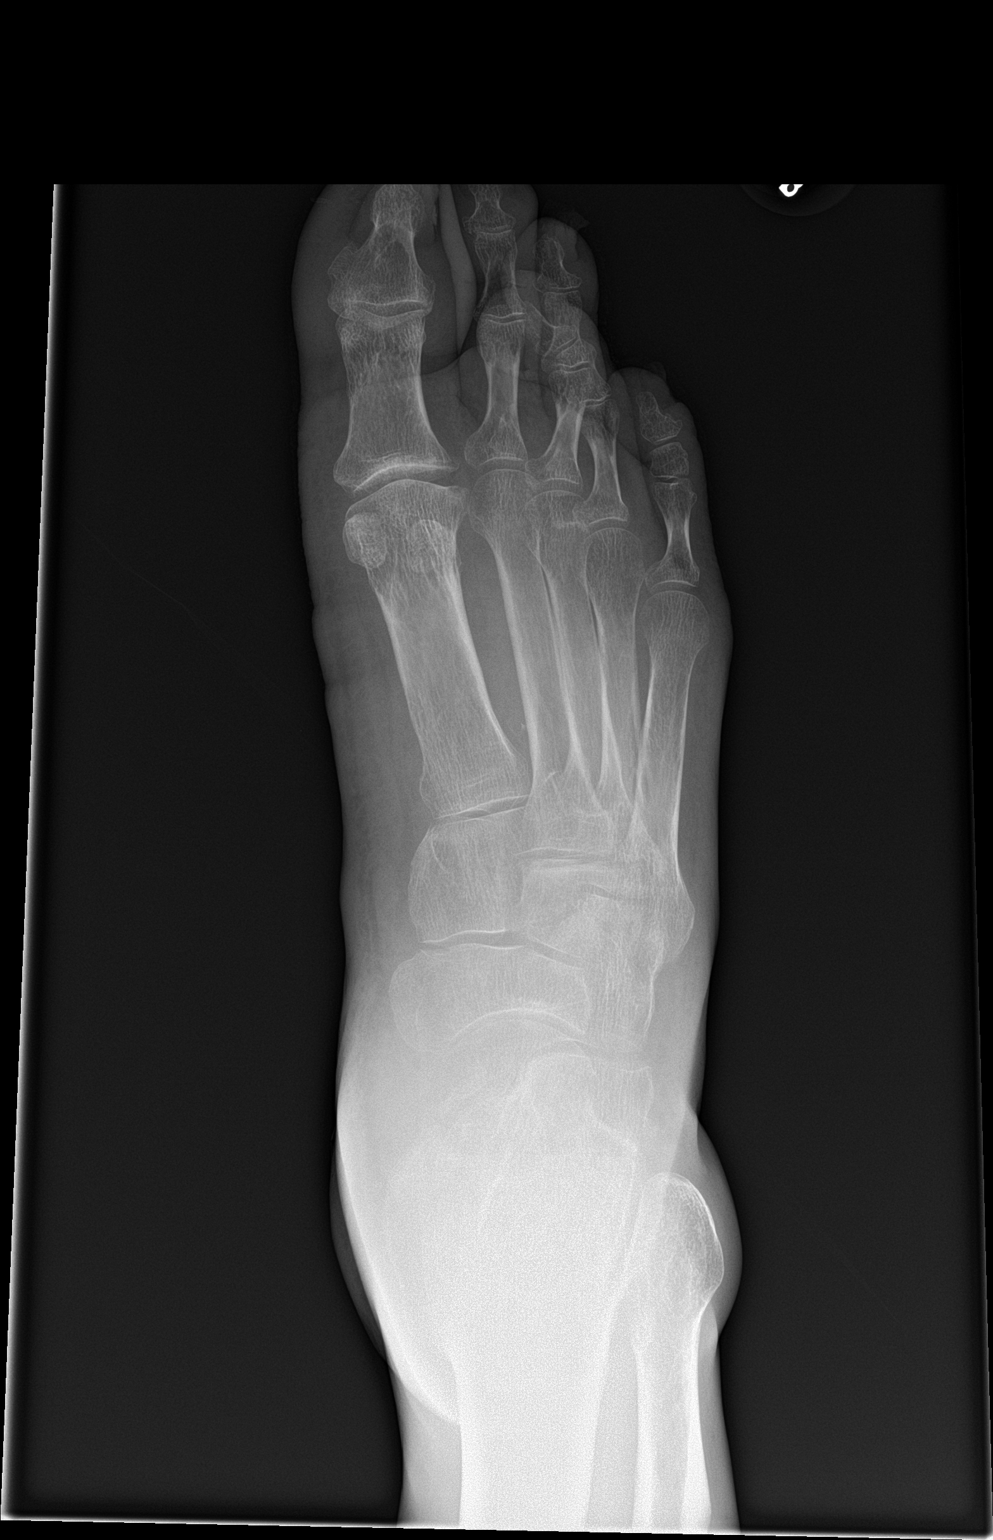

[foot obl]
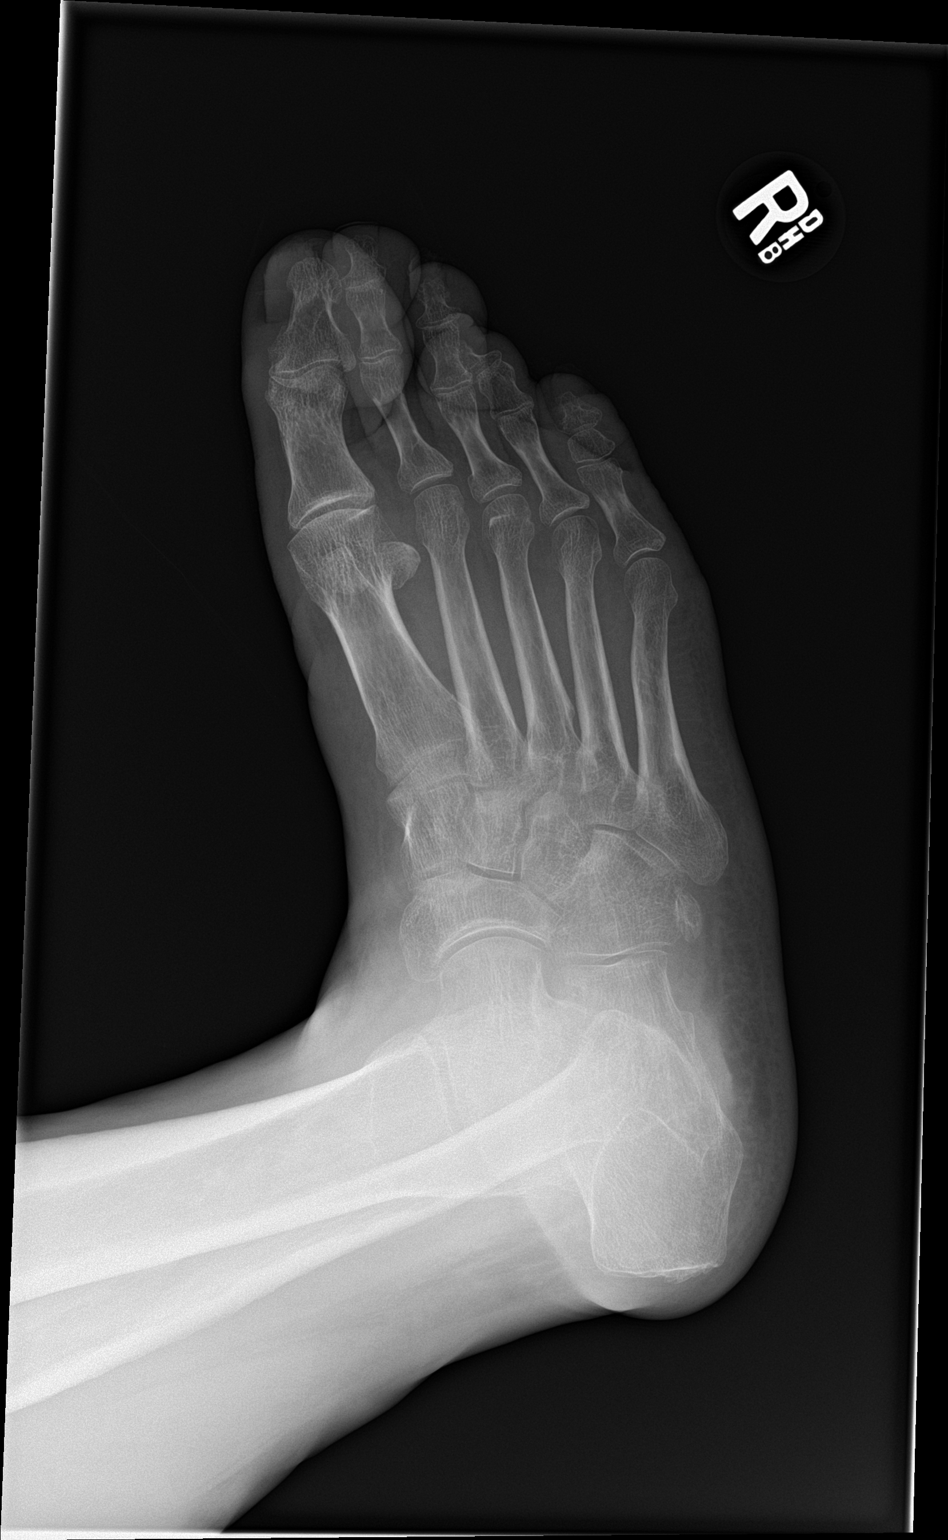

[foot lat]
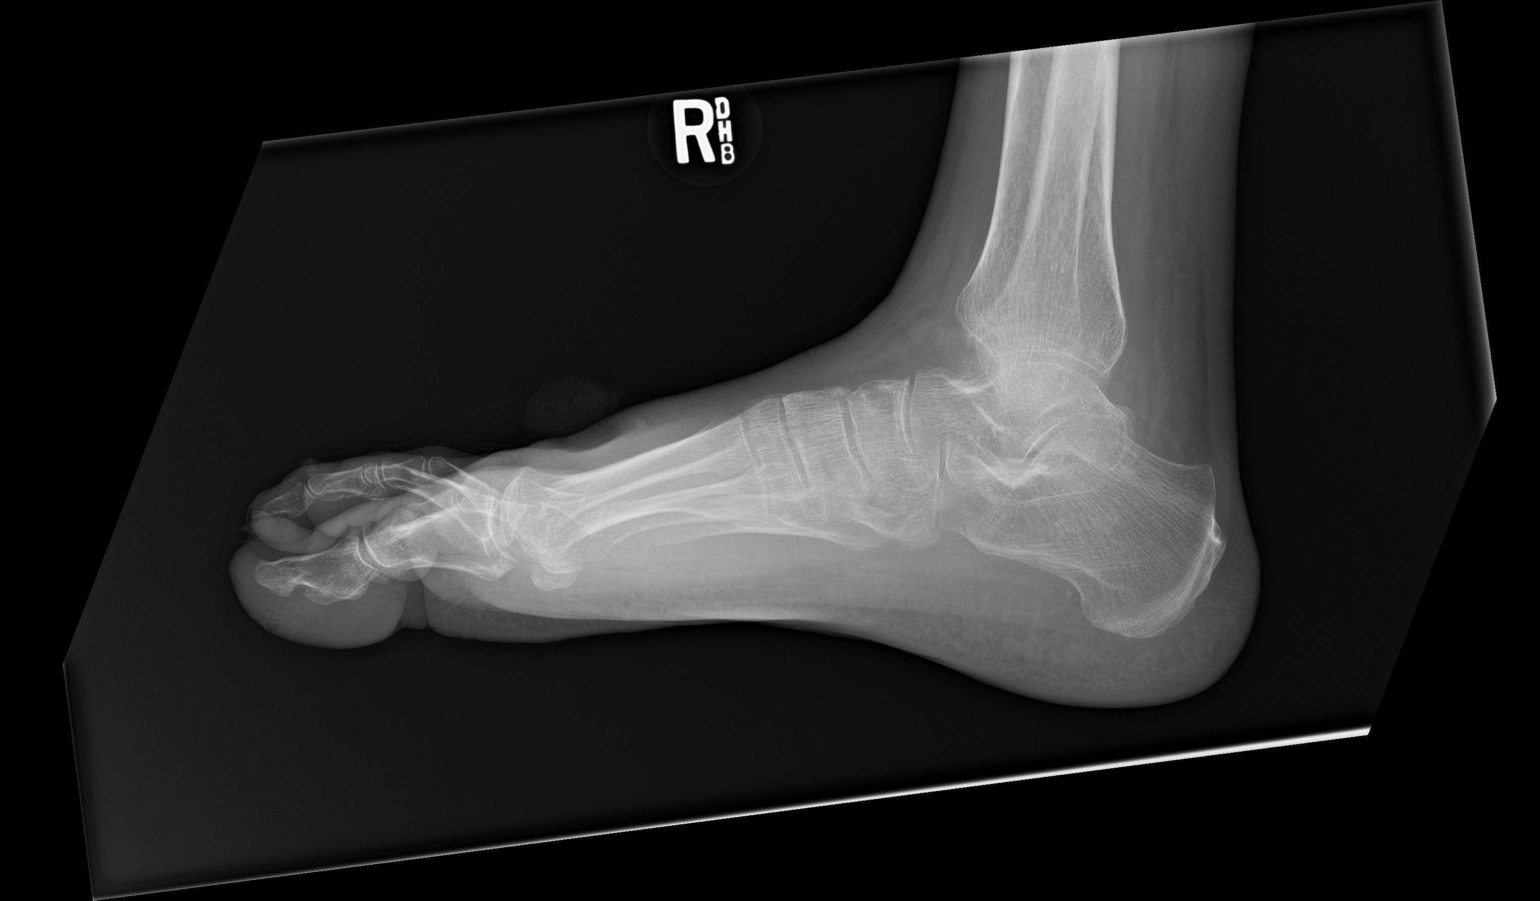

[3 of 3 positions shown; findings below may reference images not displayed]

FINDINGS: There is soft tissue irregularity along the plantar aspect of the
great toe compatible with the provided history. No soft tissue
emphysema or underlying osseous erosion is identified. No acute
fracture or dislocation is seen.
IMPRESSION: No evidence of active osteomyelitis or other acute osseous
abnormality.

## 2018-02-22 IMAGING — DX DG FOOT COMPLETE 3+V*L*
3 series · 3 of 3 positions shown · non-contrast
Comparison: None.

CLINICAL DATA: Wounds to the bilateral great toe for 1 week.
Diabetes

EXAM:
LEFT FOOT - COMPLETE 3+ VIEW

[foot ap]
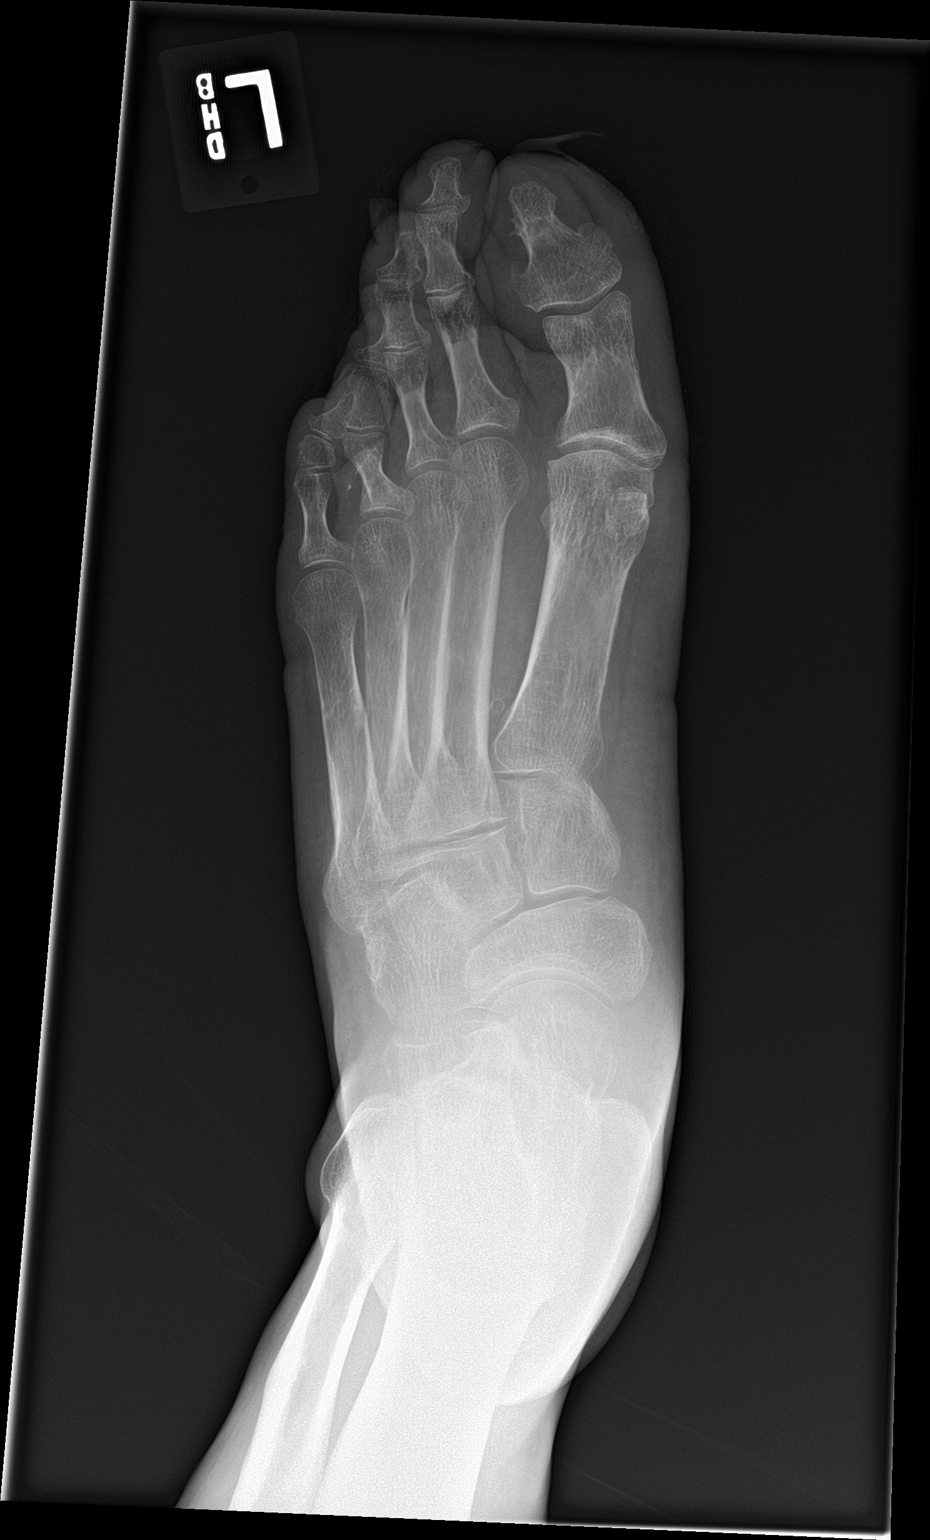

[foot obl]
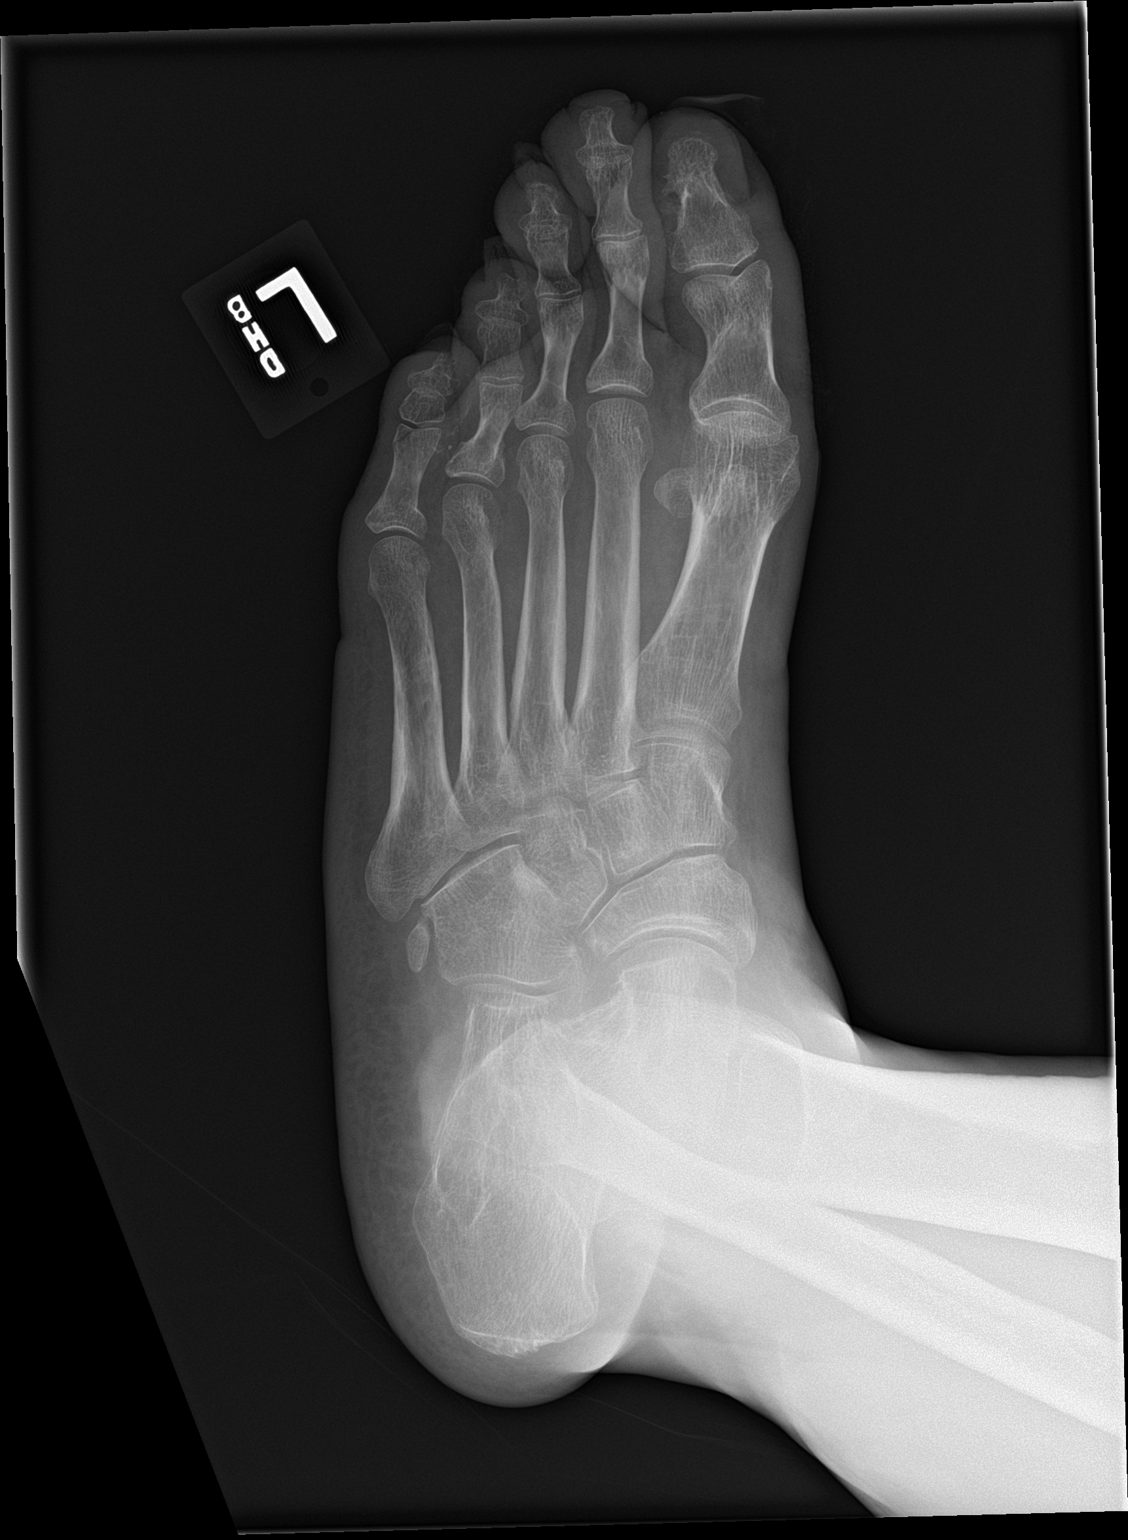

[foot lat]
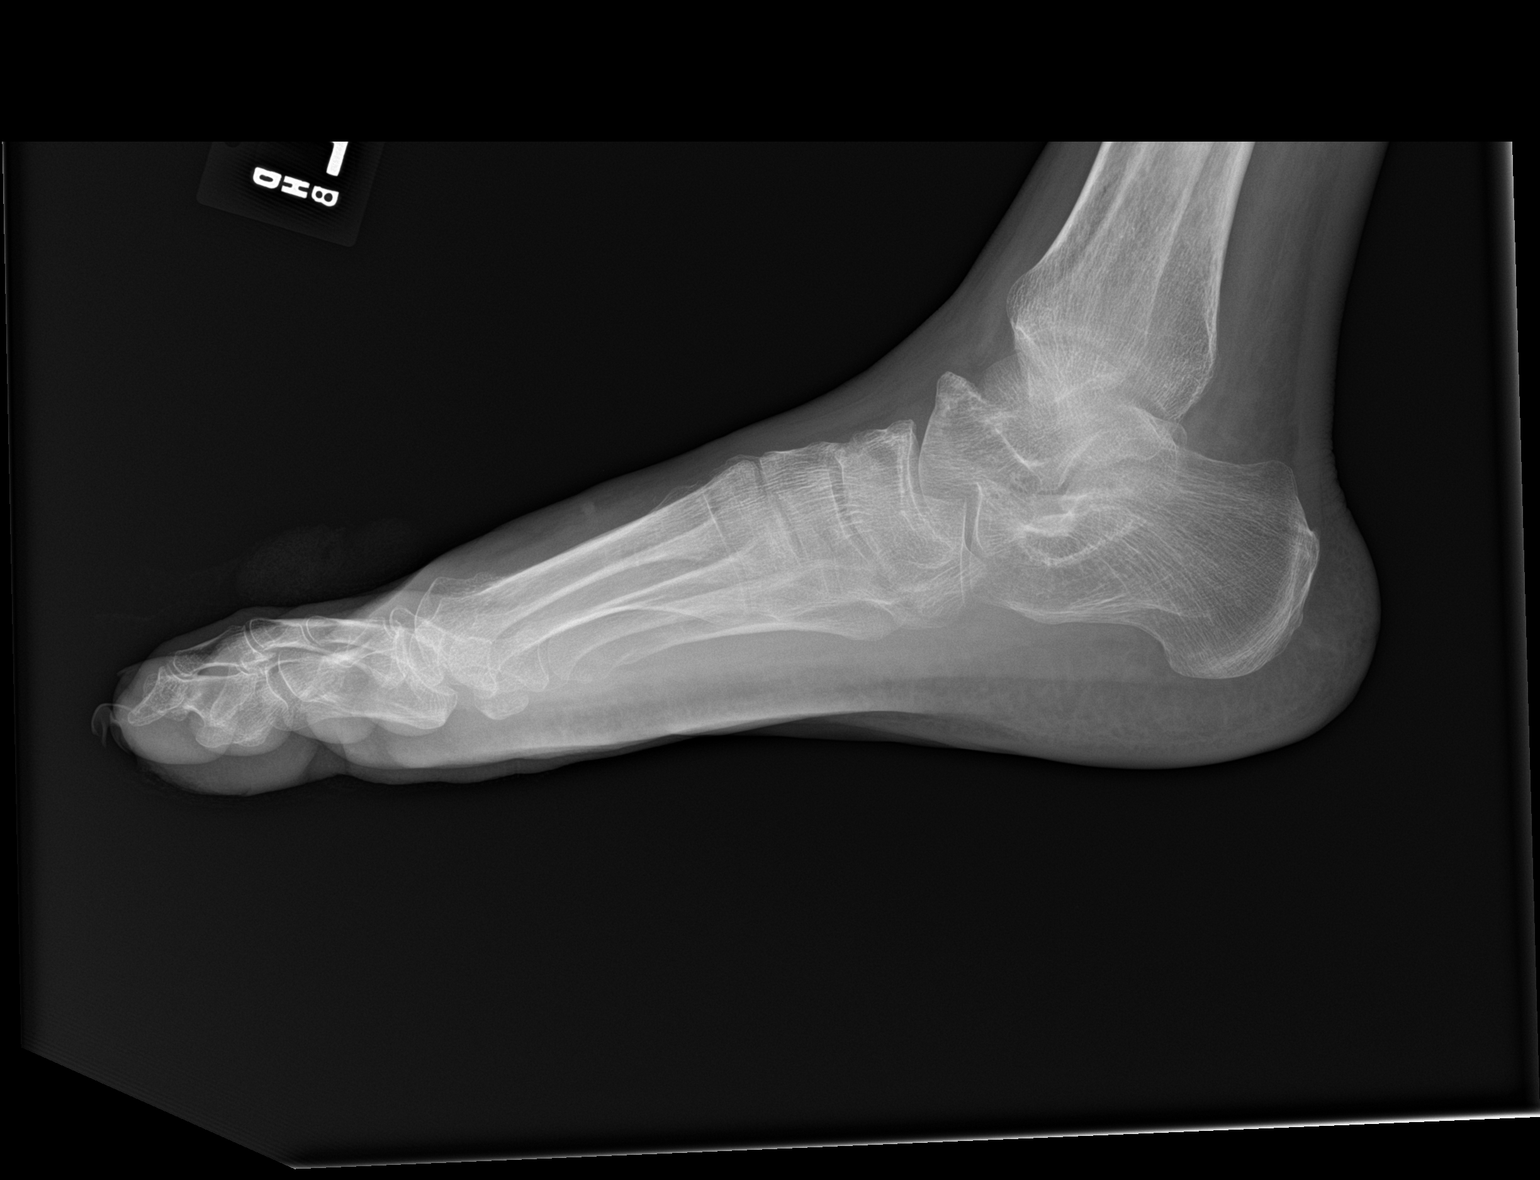

[3 of 3 positions shown; findings below may reference images not displayed]

FINDINGS: Bandage along the great toe. Punctate densities in this area appears
superficial. No embedded foreign body is suspected. No evidence of
osteomyelitis. No incidental fracture.
IMPRESSION: No evidence of osteomyelitis.  No soft tissue emphysema.

## 2018-02-22 MED ORDER — INSULIN ASPART 100 UNIT/ML ~~LOC~~ SOLN
5.0000 [IU] | Freq: Once | SUBCUTANEOUS | Status: AC
  Administered 2018-02-22: 5 [IU] via INTRAVENOUS
  Filled 2018-02-22: qty 1

## 2018-02-22 MED ORDER — SODIUM CHLORIDE 0.9 % IV BOLUS
500.0000 mL | Freq: Once | INTRAVENOUS | Status: AC
  Administered 2018-02-22: 500 mL via INTRAVENOUS

## 2018-02-22 MED ORDER — CLINDAMYCIN HCL 150 MG PO CAPS: 300.0000 mg | ORAL_CAPSULE | Freq: Once | ORAL | Status: DC

## 2018-02-22 MED ORDER — POTASSIUM CHLORIDE CRYS ER 20 MEQ PO TBCR
40.0000 meq | EXTENDED_RELEASE_TABLET | Freq: Once | ORAL | Status: AC
  Administered 2018-02-22: 40 meq via ORAL
  Filled 2018-02-22: qty 2

## 2018-02-22 MED ORDER — CEPHALEXIN 500 MG PO CAPS: 500.0000 mg | ORAL_CAPSULE | Freq: Three times a day (TID) | ORAL | 0 refills | Status: DC

## 2018-02-22 MED ORDER — CEPHALEXIN 500 MG PO CAPS
500.0000 mg | ORAL_CAPSULE | Freq: Once | ORAL | Status: AC
  Administered 2018-02-22: 500 mg via ORAL
  Filled 2018-02-22: qty 1

## 2018-02-22 MED ORDER — SODIUM CHLORIDE 0.9 % IV BOLUS
1000.0000 mL | Freq: Once | INTRAVENOUS | Status: AC
  Administered 2018-02-22: 1000 mL via INTRAVENOUS

## 2018-02-22 MED ORDER — AMOXICILLIN-POT CLAVULANATE 875-125 MG PO TABS: 1.0000 | ORAL_TABLET | Freq: Once | ORAL | Status: DC

## 2018-02-22 MED ORDER — MAGNESIUM SULFATE 2 GM/50ML IV SOLN
2.0000 g | Freq: Once | INTRAVENOUS | Status: AC
  Administered 2018-02-22: 2 g via INTRAVENOUS
  Filled 2018-02-22: qty 50

## 2018-02-22 MED ORDER — INSULIN ASPART 100 UNIT/ML ~~LOC~~ SOLN
10.0000 [IU] | Freq: Once | SUBCUTANEOUS | Status: AC
  Administered 2018-02-22: 10 [IU] via INTRAVENOUS
  Filled 2018-02-22: qty 1

## 2018-02-22 MED ORDER — POTASSIUM CHLORIDE 10 MEQ/100ML IV SOLN
10.0000 meq | Freq: Once | INTRAVENOUS | Status: AC
  Administered 2018-02-22: 10 meq via INTRAVENOUS
  Filled 2018-02-22: qty 100

## 2018-02-22 MED ORDER — SODIUM CHLORIDE 0.9 % IV SOLN
3.0000 g | Freq: Once | INTRAVENOUS | Status: AC
  Administered 2018-02-23: 3 g via INTRAVENOUS
  Filled 2018-02-22: qty 3

## 2018-02-22 NOTE — ED Triage Notes (Signed)
Patient c/o wounds to bilateral great toes X 1 week. Patient has open, bleeding ulceration to both great toes. Patient reports that he was recently dx with diabetes and started on insulin.

## 2018-02-22 NOTE — ED Notes (Signed)
Bilateral toes dressed with xeroform and gauze

## 2018-02-22 NOTE — ED Provider Notes (Signed)
[facility] Emergency Department Provider Note    First MD Initiated Contact with Patient 02/22/18 1937     (approximate)  I have reviewed the triage vital signs and the nursing notes.   HISTORY  Chief Complaint Wound Infection    HPI Gregory Moody is a 42 y.o. male the history of TB new diagnosis of diabetes started on insulin presents to the ER for worsening wound and bleeding from bilateral great toes.  Denies any trauma.  Has never had symptoms like this before.  States his been taking his insulin without any improvement in his blood sugars.  States he is also feeling nauseated and has been on Zantac without any improvement in symptoms.  Denies any fevers.  Does have numbness and tingling to bilateral lower extremities.  Past Medical History:  Diagnosis Date  . Diabetes mellitus without complication ([place])   . Gastroparesis   . Tuberculosis    Family History  Problem Relation Age of Onset  . Diabetes Mother   . Diabetes Father    Past Surgical History:  Procedure Laterality Date  . NO PAST SURGERIES     Patient Active Problem List   Diagnosis Date Noted  . Aspiration pneumonia ([place]) 04/21/2017  . Hypoglycemia 04/21/2017  . Diabetes ([place]) 04/21/2017  . Hip fracture, unspecified laterality, closed, initial encounter ([place]) 04/17/2017  . Hyperglycemia 04/17/2017  . Protein-calorie malnutrition, severe 04/09/2016  . Cavitary lesion of lung 04/06/2016      Prior to Admission medications   Medication Sig Start Date End Date Taking? Authorizing Provider  aluminum-magnesium hydroxide-simethicone (MAALOX) [phone] MG/5ML SUSP Take 30 mLs by mouth 4 (four) times daily -  before meals and at bedtime. 08/12/17   Carrie Mew, MD  amoxicillin-clavulanate (AUGMENTIN) 875-125 MG tablet Take 1 tablet by mouth every 12 (twelve) hours. Patient not taking: Reported on 08/12/2017 04/22/17   Epifanio Lesches, MD  cephALEXin (KEFLEX) 500 MG  capsule Take 1 capsule (500 mg total) by mouth 3 (three) times daily for 10 days. 02/22/18 03/04/18  Merlyn Lot, MD  famotidine (PEPCID) 20 MG tablet Take 1 tablet (20 mg total) by mouth 2 (two) times daily. 08/12/17   Carrie Mew, MD  feeding supplement, ENSURE ENLIVE, (ENSURE ENLIVE) LIQD Take 237 mLs by mouth 2 (two) times daily with a meal. 04/19/17   Fritzi Mandes, MD  hydrocortisone 2.5 % ointment Apply 1 application topically 2 (two) times daily as needed for rash.    [provider]  insulin NPH-regular Human (NOVOLIN 70/30) (70-30) 100 UNIT/ML injection Inject 35 Units into the skin 2 (two) times daily with a meal. 04/22/17   Fritzi Mandes, MD  metoCLOPramide (REGLAN) 10 MG tablet Take 1 tablet (10 mg total) by mouth every 6 (six) hours as needed. 08/12/17   Carrie Mew, MD  Multiple Vitamin (MULTIVITAMIN WITH MINERALS) TABS tablet Take 1 tablet by mouth daily. 04/20/17   Fritzi Mandes, MD  traMADol (ULTRAM) 50 MG tablet Take 1 tablet (50 mg total) by mouth every 6 (six) hours as needed for moderate pain. 04/19/17   Fritzi Mandes, MD    Allergies Patient has no known allergies.    Social History Social History   Tobacco Use  . Smoking status: Never Smoker  . Smokeless tobacco: Never Used  Substance Use Topics  . Alcohol use: No  . Drug use: No    Review of Systems Patient denies headaches, rhinorrhea, blurry vision, numbness, shortness of breath, chest pain, edema, cough, abdominal pain, nausea,  vomiting, diarrhea, dysuria, fevers, rashes or hallucinations unless otherwise stated above in HPI. ____________________________________________   PHYSICAL EXAM:  VITAL SIGNS: Vitals:   02/22/18 2230 02/22/18 2330  BP: 125/87 (!) 141/90  Pulse: 76 71  Resp: 10 11  Temp:    SpO2: 100% 100%    Constitutional: Alert and oriented. Frail appearing but in no acute distress. Eyes: Conjunctivae are normal.  Head: Atraumatic. Nose: No  congestion/rhinnorhea. Mouth/Throat: Mucous membranes are moist.   Neck: No stridor. Painless ROM.  Cardiovascular: Normal rate, regular rhythm. Grossly normal heart sounds.  Good peripheral circulation. Respiratory: Normal respiratory effort.  No retractions. Lungs CTAB. Gastrointestinal: Soft and nontender. No distention. No abdominal bruits. No CVA tenderness. Genitourinary:  Musculoskeletal: No lower extremity tenderness nor edema.  No joint effusions.  There si a small area of skin breakdown to the great toe of BLE,  Mild surrounding erythema but with brisk cap refill.  Strong DP and PT pulses. Neurologic:  Normal speech and language. No gross focal neurologic deficits are appreciated. No facial droop Skin:  Skin is warm, dry and intact. No rash noted. Psychiatric: Mood and affect are normal. Speech and behavior are normal.  ____________________________________________   LABS (all labs ordered are listed, but only abnormal results are displayed)  Results for orders placed or performed during the hospital encounter of 02/22/18 (from the past 24 hour(s))  Comprehensive metabolic panel     Status: Abnormal   Collection Time: 02/22/18  7:25 PM  Result Value Ref Range   Sodium 128 (L) 135 - 145 mmol/L   Potassium 2.9 (L) 3.5 - 5.1 mmol/L   Chloride 87 (L) 101 - 111 mmol/L   CO2 27 22 - 32 mmol/L   Glucose, Bld 620 (HH) 65 - 99 mg/dL   BUN 7 6 - 20 mg/dL   Creatinine, Ser 0.99 0.61 - 1.24 mg/dL   Calcium 8.5 (L) 8.9 - 10.3 mg/dL   Total Protein 7.4 6.5 - 8.1 g/dL   Albumin 3.3 (L) 3.5 - 5.0 g/dL   AST 15 15 - 41 U/L   ALT 9 (L) 17 - 63 U/L   Alkaline Phosphatase 102 38 - 126 U/L   Total Bilirubin 0.7 0.3 - 1.2 mg/dL   GFR calc non Af Amer >60 >60 mL/min   GFR calc Af Amer >60 >60 mL/min   Anion gap 14 5 - 15  CBC with Differential     Status: Abnormal   Collection Time: 02/22/18  7:25 PM  Result Value Ref Range   WBC 7.6 3.8 - 10.6 K/uL   RBC 3.17 (L) 4.40 - 5.90 MIL/uL    Hemoglobin 10.0 (L) 13.0 - 18.0 g/dL   HCT 29.2 (L) 40.0 - 52.0 %   MCV 92.0 80.0 - 100.0 fL   MCH 31.5 26.0 - 34.0 pg   MCHC 34.2 32.0 - 36.0 g/dL   RDW 12.2 11.5 - 14.5 %   Platelets 427 150 - 440 K/uL   Neutrophils Relative % 79 %   Neutro Abs 6.0 1.4 - 6.5 K/uL   Lymphocytes Relative 16 %   Lymphs Abs 1.2 1.0 - 3.6 K/uL   Monocytes Relative 5 %   Monocytes Absolute 0.4 0.2 - 1.0 K/uL   Eosinophils Relative 0 %   Eosinophils Absolute 0.0 0 - 0.7 K/uL   Basophils Relative 0 %   Basophils Absolute 0.0 0 - 0.1 K/uL  Glucose, capillary     Status: Abnormal   Collection Time: 02/22/18  7:28 PM  Result Value Ref Range   Glucose-Capillary >600 (HH) 65 - 99 mg/dL  Glucose, capillary     Status: Abnormal   Collection Time: 02/22/18 10:11 PM  Result Value Ref Range   Glucose-Capillary 353 (H) 65 - 99 mg/dL  Urinalysis, Complete w Microscopic     Status: Abnormal   Collection Time: 02/22/18 11:22 PM  Result Value Ref Range   Color, Urine STRAW (A) YELLOW   APPearance HAZY (A) CLEAR   Specific Gravity, Urine 1.020 1.005 - 1.030   pH 6.0 5.0 - 8.0   Glucose, UA >=500 (A) NEGATIVE mg/dL   Hgb urine dipstick NEGATIVE NEGATIVE   Bilirubin Urine NEGATIVE NEGATIVE   Ketones, ur 20 (A) NEGATIVE mg/dL   Protein, ur NEGATIVE NEGATIVE mg/dL   Nitrite NEGATIVE NEGATIVE   Leukocytes, UA SMALL (A) NEGATIVE   RBC / HPF 6-10 0 - 5 RBC/hpf   WBC, UA 6-10 0 - 5 WBC/hpf   Bacteria, UA RARE (A) NONE SEEN   Squamous Epithelial / LPF 0-5 0 - 5   Budding Yeast PRESENT   Glucose, capillary     Status: Abnormal   Collection Time: 02/23/18 12:13 AM  Result Value Ref Range   Glucose-Capillary 233 (H) 65 - 99 mg/dL   ____________________________________________ ____________________________________________  RADIOLOGY  I personally reviewed all radiographic images ordered to evaluate for the above acute complaints and reviewed radiology reports and findings.  These findings were personally discussed  with the patient.  Please see medical record for radiology report.  ____________________________________________   PROCEDURES  Procedure(s) performed:  Procedures    Critical Care performed: no ____________________________________________   INITIAL IMPRESSION / ASSESSMENT AND PLAN / ED COURSE  Pertinent labs & imaging results that were available during my care of the patient were reviewed by me and considered in my medical decision making (see chart for details).  DDX: hhns, dka, osteo, gangrene, cellulitis  Isamu Trammel is a 42 y.o. who presents to the ED with glycemia and wounds to bilateral toes.  He is afebrile no leukocytosis.  No signs of osteomyelitis or sepsis.  Patient given IV fluids as well as IV insulin with improvement in his glucose.    Patient with limited access and ability to pay for medications.  With persistent hyperglycemia despite volume resuscitation as well as several doses of IV insulin..  Based on his limited follow-up and persistent and frequent visits to the ER with poorly controlled hyperglycemia now with diabetic foot ulcers spoke with Dr. Jannifer Franklin who kindly agrees to admit patient for antibiotics, IV fluids, glucose control and social work.  As part of my medical decision making, I reviewed the following data within the [facility] notes reviewed and incorporated, Labs reviewed, notes from prior ED visits.  ____________________________________________   FINAL CLINICAL IMPRESSION(S) / ED DIAGNOSES  Final diagnoses:  Diabetic ulcer of toe of right foot associated with type 2 diabetes mellitus, limited to breakdown of skin (HCC)  Diabetic ulcer of toe of left foot associated with type 2 diabetes mellitus, limited to breakdown of skin (HCC)  Hyperglycemia      NEW MEDICATIONS STARTED DURING THIS VISIT:  New Prescriptions   CEPHALEXIN (KEFLEX) 500 MG CAPSULE    Take 1 capsule (500 mg total) by mouth 3 (three)  times daily for 10 days.     Note:  This document was prepared using Dragon voice recognition software and may include unintentional dictation errors.    Merlyn Lot, MD  02/23/18 0019  

## 2018-02-23 ENCOUNTER — Other Ambulatory Visit: Payer: Self-pay

## 2018-02-23 DIAGNOSIS — K219 Gastro-esophageal reflux disease without esophagitis: Secondary | ICD-10-CM | POA: Diagnosis present

## 2018-02-23 DIAGNOSIS — K3184 Gastroparesis: Secondary | ICD-10-CM

## 2018-02-23 DIAGNOSIS — L97509 Non-pressure chronic ulcer of other part of unspecified foot with unspecified severity: Secondary | ICD-10-CM

## 2018-02-23 DIAGNOSIS — E11621 Type 2 diabetes mellitus with foot ulcer: Secondary | ICD-10-CM | POA: Diagnosis present

## 2018-02-23 DIAGNOSIS — E1143 Type 2 diabetes mellitus with diabetic autonomic (poly)neuropathy: Secondary | ICD-10-CM | POA: Diagnosis present

## 2018-02-23 LAB — URINALYSIS, COMPLETE (UACMP) WITH MICROSCOPIC
BILIRUBIN URINE: NEGATIVE
Hgb urine dipstick: NEGATIVE
KETONES UR: 20 mg/dL — AB
Nitrite: NEGATIVE
PH: 6 (ref 5.0–8.0)
Protein, ur: NEGATIVE mg/dL
Specific Gravity, Urine: 1.02 (ref 1.005–1.030)

## 2018-02-23 LAB — BASIC METABOLIC PANEL
ANION GAP: 9 (ref 5–15)
BUN: <5 mg/dL — ABNORMAL LOW (ref 6–20)
CHLORIDE: 95 mmol/L — AB (ref 101–111)
CO2: 30 mmol/L (ref 22–32)
Calcium: 8.5 mg/dL — ABNORMAL LOW (ref 8.9–10.3)
Creatinine, Ser: 0.68 mg/dL (ref 0.61–1.24)
GFR calc Af Amer: >60 mL/min (ref >60)
Glucose, Bld: 363 mg/dL — ABNORMAL HIGH (ref 65–99)
POTASSIUM: 3.5 mmol/L (ref 3.5–5.1)
SODIUM: 134 mmol/L — AB (ref 135–145)

## 2018-02-23 LAB — CBC
HEMATOCRIT: 30.2 % — AB (ref 40.0–52.0)
HEMOGLOBIN: 10.5 g/dL — AB (ref 13.0–18.0)
MCH: 31.7 pg (ref 26.0–34.0)
MCHC: 34.8 g/dL (ref 32.0–36.0)
MCV: 90.9 fL (ref 80.0–100.0)
Platelets: 440 10*3/uL (ref 150–440)
RBC: 3.32 MIL/uL — ABNORMAL LOW (ref 4.40–5.90)
RDW: 12.2 % (ref 11.5–14.5)
WBC: 6.6 10*3/uL (ref 3.8–10.6)

## 2018-02-23 LAB — GLUCOSE, CAPILLARY
GLUCOSE-CAPILLARY: 208 mg/dL — AB (ref 65–99)
GLUCOSE-CAPILLARY: 233 mg/dL — AB (ref 65–99)
GLUCOSE-CAPILLARY: 233 mg/dL — AB (ref 65–99)
GLUCOSE-CAPILLARY: 276 mg/dL — AB (ref 65–99)
GLUCOSE-CAPILLARY: 383 mg/dL — AB (ref 65–99)
Glucose-Capillary: 190 mg/dL — ABNORMAL HIGH (ref 65–99)
Glucose-Capillary: 209 mg/dL — ABNORMAL HIGH (ref 65–99)

## 2018-02-23 LAB — HEMOGLOBIN A1C
Hgb A1c MFr Bld: 12.1 % — ABNORMAL HIGH (ref 4.8–5.6)
Mean Plasma Glucose: 300.57 mg/dL

## 2018-02-23 MED ORDER — ONDANSETRON HCL 4 MG PO TABS: 4.0000 mg | ORAL_TABLET | Freq: Four times a day (QID) | ORAL | Status: DC | PRN

## 2018-02-23 MED ORDER — ENOXAPARIN SODIUM 40 MG/0.4ML ~~LOC~~ SOLN
40.0000 mg | SUBCUTANEOUS | Status: DC
  Administered 2018-02-23: 40 mg via SUBCUTANEOUS
  Filled 2018-02-23: qty 0.4

## 2018-02-23 MED ORDER — PREMIER PROTEIN SHAKE
11.0000 [oz_av] | Freq: Two times a day (BID) | ORAL | Status: DC
  Administered 2018-02-23: 11 [oz_av] via ORAL

## 2018-02-23 MED ORDER — SODIUM CHLORIDE 0.9 % IV SOLN
INTRAVENOUS | Status: AC
  Administered 2018-02-23: 03:00:00 via INTRAVENOUS

## 2018-02-23 MED ORDER — PNEUMOCOCCAL VAC POLYVALENT 25 MCG/0.5ML IJ INJ
0.5000 mL | INJECTION | INTRAMUSCULAR | Status: AC
  Administered 2018-02-24: 0.5 mL via INTRAMUSCULAR
  Filled 2018-02-23: qty 0.5

## 2018-02-23 MED ORDER — ACETAMINOPHEN 650 MG RE SUPP: 650.0000 mg | Freq: Four times a day (QID) | RECTAL | Status: DC | PRN

## 2018-02-23 MED ORDER — ACETAMINOPHEN 325 MG PO TABS: 650.0000 mg | ORAL_TABLET | Freq: Four times a day (QID) | ORAL | Status: DC | PRN

## 2018-02-23 MED ORDER — INSULIN GLARGINE 100 UNIT/ML ~~LOC~~ SOLN
15.0000 [IU] | Freq: Every day | SUBCUTANEOUS | Status: DC
  Administered 2018-02-23 – 2018-02-24 (×2): 15 [IU] via SUBCUTANEOUS
  Filled 2018-02-23 (×2): qty 0.15

## 2018-02-23 MED ORDER — METOCLOPRAMIDE HCL 10 MG PO TABS: 10.0000 mg | ORAL_TABLET | Freq: Four times a day (QID) | ORAL | Status: DC | PRN

## 2018-02-23 MED ORDER — INSULIN ASPART 100 UNIT/ML ~~LOC~~ SOLN
0.0000 [IU] | Freq: Every day | SUBCUTANEOUS | Status: DC
  Administered 2018-02-23: 2 [IU] via SUBCUTANEOUS
  Administered 2018-02-23: 5 [IU] via SUBCUTANEOUS
  Filled 2018-02-23 (×2): qty 1

## 2018-02-23 MED ORDER — OXYCODONE HCL 5 MG PO TABS
5.0000 mg | ORAL_TABLET | ORAL | Status: DC | PRN
  Administered 2018-02-23: 5 mg via ORAL
  Filled 2018-02-23: qty 1

## 2018-02-23 MED ORDER — ADULT MULTIVITAMIN W/MINERALS CH
1.0000 | ORAL_TABLET | Freq: Every day | ORAL | Status: DC
  Administered 2018-02-24: 1 via ORAL
  Filled 2018-02-23: qty 1

## 2018-02-23 MED ORDER — FAMOTIDINE 20 MG PO TABS
20.0000 mg | ORAL_TABLET | Freq: Two times a day (BID) | ORAL | Status: DC
  Administered 2018-02-23 – 2018-02-24 (×3): 20 mg via ORAL
  Filled 2018-02-23 (×5): qty 1

## 2018-02-23 MED ORDER — PIPERACILLIN-TAZOBACTAM 3.375 G IVPB
3.3750 g | Freq: Three times a day (TID) | INTRAVENOUS | Status: DC
  Administered 2018-02-23 – 2018-02-24 (×4): 3.375 g via INTRAVENOUS
  Filled 2018-02-23 (×4): qty 50

## 2018-02-23 MED ORDER — INSULIN ASPART 100 UNIT/ML ~~LOC~~ SOLN
4.0000 [IU] | Freq: Three times a day (TID) | SUBCUTANEOUS | Status: DC
  Administered 2018-02-23 – 2018-02-24 (×2): 4 [IU] via SUBCUTANEOUS
  Filled 2018-02-23: qty 1

## 2018-02-23 MED ORDER — ONDANSETRON HCL 4 MG/2ML IJ SOLN: 4.0000 mg | Freq: Four times a day (QID) | INTRAMUSCULAR | Status: DC | PRN

## 2018-02-23 MED ORDER — INSULIN ASPART 100 UNIT/ML ~~LOC~~ SOLN
0.0000 [IU] | Freq: Three times a day (TID) | SUBCUTANEOUS | Status: DC
  Administered 2018-02-23: 3 [IU] via SUBCUTANEOUS
  Administered 2018-02-23: 5 [IU] via SUBCUTANEOUS
  Administered 2018-02-23: 8 [IU] via SUBCUTANEOUS
  Administered 2018-02-24: 11 [IU] via SUBCUTANEOUS
  Filled 2018-02-23 (×4): qty 1

## 2018-02-23 NOTE — Progress Notes (Addendum)
Inpatient Diabetes Program Recommendations  AACE/ADA: New Consensus Statement on Inpatient Glycemic Control (2015)  Target Ranges:  Prepandial:   less than 140 mg/dL      Peak postprandial:   less than 180 mg/dL (1-2 hours)      Critically ill patients:  140 - 180 mg/dL   Lab Results  Component Value Date   GLUCAP 190 (H) 02/23/2018   HGBA1C 12.1 (H) 02/23/2018    Review of Glycemic Control Results for Gregory Moody, Gregory Moody (MRN [id number]) as of 02/23/2018 13:23  Ref. Range 02/22/2018 22:11 02/23/2018 00:13 02/23/2018 03:01 02/23/2018 08:02 02/23/2018 11:46  Glucose-Capillary Latest Ref Range: 65 - 99 mg/dL 353 (H) 233 (H) 383 (H) 276 (H) 190 (H)   Diabetes history: DM with hyperglycemia Outpatient Diabetes medications: Novolin 70/30 35 units bid Current orders for Inpatient glycemic control:  Novolog moderate tid with meals and HS  Inpatient Diabetes Program Recommendations:   Note elevated A1C.  This is an improvement from 2017.  Blood sugars have improved since admit.  May consider adding Lantus 15 units daily and Novolog 4 units tid with meals (hold if patient eats less than 50%).    Thanks,  Adah Perl, RN, BC-ADM Inpatient Diabetes Coordinator Pager [phone] (8a-5p)  [phone]- Spoke with RN caring for patient. She states that they asked patient using interpreter if he was taking insulin and he states yes.  Will follow up on 02/24/18.

## 2018-02-23 NOTE — Progress Notes (Signed)
Patient here for diabetic foot ulcer.  Podiatry consultation pending. Agree with plan by admitting MD.

## 2018-02-23 NOTE — Progress Notes (Signed)
Initial Nutrition Assessment  DOCUMENTATION CODES:   Non-severe (moderate) malnutrition in context of chronic illness  INTERVENTION:  Provide Premier Protein po BID, each supplement provides 160 kcal and 30 grams of protein.  Provide daily MVI.  Encouraged patient to continue eating small, frequent meals throughout the day in setting of gastroparesis with early satiety and N/V. Discussed choosing foods that are lower in fat and fiber to promote better tolerance. Also discussed limiting added sugars in diet to help with better glycemic control.  NUTRITION DIAGNOSIS:   Moderate Malnutrition related to chronic illness(DM with gastroparesis, hx tuberculosis) as evidenced by moderate fat depletion, mild muscle depletion, moderate muscle depletion, severe muscle depletion.  Last year patient met criteria for severe chronic malnutrition. With his weight gain and improvement in muscle and fat stores, he now meets criteria for moderate chronic malnutrition. Legs still have severe muscle wasting.  GOAL:   Patient will meet greater than or equal to 90% of their needs  MONITOR:   PO intake, Supplement acceptance, Labs, Weight trends, Skin, I & O's  REASON FOR ASSESSMENT:   Malnutrition Screening Tool    ASSESSMENT:   42 year old male with PMHx of tuberculosis, DM type 2, gastroparesis who is admitted with diabetic foot ulcers s/p bedside debridement on 5/16.   Met with patient at bedside. Spanish interpreter present to assist with assessment (Irwing [place]). Patient is known to this RD from an admission in 04/2017. Patient reports his appetite is okay, but he has difficulty tolerating food. He experiences early satiety, nausea, and vomiting. He also has a chronic issue with diarrhea. He tries to eat small, frequent meals during the day (every 2 hours per his report today). He reports his family likes to eat greasy/fried foods so that is often times what he has to eat at home. After some  discussion on the best foods to choose for his diabetes and gastroparesis, he feels his family would be open to eating more foods prepared at home that will be better for the patient. He does not tolerate Glucerna well. He is amenable to trying Premier Protein.   UBW had been 140-150 lbs. On 04/17/2017 patient was only 97.4 lbs. He has now gained to 125.6 lbs, which is a big improvement.  Medications reviewed and include: famotidine, Novolog 0-15 units TID, Novolog 0-5 units QHS, Zosyn.  Labs reviewed: MST 190-383, Sodium 134, Chloride 95, BUN <5, HgbA1c 12.1. On 04/08/2016 his HgbA1c was 16.5, so current HgbA1c is actually an improvement for patient.  Discussed with RN.  NUTRITION - FOCUSED PHYSICAL EXAM:    Most Recent Value  Orbital Region  Moderate depletion  Upper Arm Region  Moderate depletion  Thoracic and Lumbar Region  Moderate depletion  Buccal Region  Moderate depletion  Temple Region  Moderate depletion  Clavicle Bone Region  Mild depletion  Clavicle and Acromion Bone Region  Mild depletion  Scapular Bone Region  Mild depletion  Dorsal Hand  Moderate depletion  Patellar Region  Severe depletion  Anterior Thigh Region  Severe depletion  Posterior Calf Region  Severe depletion  Edema (RD Assessment)  None  Hair  Reviewed  Eyes  Reviewed  Mouth  Reviewed  Skin  Reviewed  Nails  Reviewed     Diet Order:   Diet Order           Diet Carb Modified Fluid consistency: Thin; Room service appropriate? Yes  Diet effective now          EDUCATION NEEDS:  Education needs have been addressed  Skin:  Skin Assessment: Skin Integrity Issues: Skin Integrity Issues:: Diabetic Ulcer Diabetic Ulcer: right and left great toes  Last BM:  02/23/2018 - no BM characteristics documented  Height:   Ht Readings from Last 1 Encounters:  02/23/18 '5\' 8"'  (1.727 m)    Weight:   Wt Readings from Last 1 Encounters:  02/23/18 125 lb 9.6 oz (57 kg)    Ideal Body Weight:  70 kg  BMI:   Body mass index is 19.1 kg/m.  Estimated Nutritional Needs:   Kcal:  4591-3685 (MSJ x 1.2-1.4)  Protein:  75-85 grams (1.3-1.5 grams/kg)  Fluid:  1.7-2 L/day (30-35 mL/kg)  Willey Blade, MS, RD, LDN Office: [phone] Pager: [phone] After Hours/Weekend Pager: [phone]

## 2018-02-23 NOTE — Progress Notes (Signed)
Dr.Troxler in to see pt. Spanish interpreter (Irwing) in to assist with communication.

## 2018-02-23 NOTE — H&P (Signed)
[facility] NAME: Sabien Umland    MR#:  [id number]  DATE OF BIRTH:  October 09, 1976  DATE OF ADMISSION:  02/22/2018  PRIMARY CARE PHYSICIAN: Department, [facility]   REQUESTING/REFERRING PHYSICIAN: Quentin Cornwall, MD  CHIEF COMPLAINT:   Chief Complaint  Patient presents with  . Wound Infection    HISTORY OF PRESENT ILLNESS:  Rodgers Likes  is a 42 y.o. male who presents with bilateral great toe ulcerations.  Patient is diabetic and states that he has had these wounds for several weeks now.  Tonight 1 of them began bleeding and so he came to the ED for evaluation.  He has some surrounding cellulitis, and hospitalist were called for admission.  PAST MEDICAL HISTORY:   Past Medical History:  Diagnosis Date  . Diabetes mellitus without complication ([place])   . Gastroparesis   . Tuberculosis      PAST SURGICAL HISTORY:   Past Surgical History:  Procedure Laterality Date  . NO PAST SURGERIES       SOCIAL HISTORY:   Social History   Tobacco Use  . Smoking status: Never Smoker  . Smokeless tobacco: Never Used  Substance Use Topics  . Alcohol use: No     FAMILY HISTORY:   Family History  Problem Relation Age of Onset  . Diabetes Mother   . Diabetes Father      DRUG ALLERGIES:  No Known Allergies  MEDICATIONS AT HOME:   Prior to Admission medications   Medication Sig Start Date End Date Taking? Authorizing Provider  aluminum-magnesium hydroxide-simethicone (MAALOX) 762-831-51 MG/5ML SUSP Take 30 mLs by mouth 4 (four) times daily -  before meals and at bedtime. 08/12/17   Carrie Mew, MD  amoxicillin-clavulanate (AUGMENTIN) 875-125 MG tablet Take 1 tablet by mouth every 12 (twelve) hours. Patient not taking: Reported on 08/12/2017 04/22/17   Epifanio Lesches, MD  cephALEXin (KEFLEX) 500 MG capsule Take 1 capsule (500 mg total) by mouth 3 (three) times daily for 10 days.  02/22/18 03/04/18  Merlyn Lot, MD  famotidine (PEPCID) 20 MG tablet Take 1 tablet (20 mg total) by mouth 2 (two) times daily. 08/12/17   Carrie Mew, MD  feeding supplement, ENSURE ENLIVE, (ENSURE ENLIVE) LIQD Take 237 mLs by mouth 2 (two) times daily with a meal. 04/19/17   Fritzi Mandes, MD  hydrocortisone 2.5 % ointment Apply 1 application topically 2 (two) times daily as needed for rash.    [provider]  insulin NPH-regular Human (NOVOLIN 70/30) (70-30) 100 UNIT/ML injection Inject 35 Units into the skin 2 (two) times daily with a meal. 04/22/17   Fritzi Mandes, MD  metoCLOPramide (REGLAN) 10 MG tablet Take 1 tablet (10 mg total) by mouth every 6 (six) hours as needed. 08/12/17   Carrie Mew, MD  Multiple Vitamin (MULTIVITAMIN WITH MINERALS) TABS tablet Take 1 tablet by mouth daily. 04/20/17   Fritzi Mandes, MD  traMADol (ULTRAM) 50 MG tablet Take 1 tablet (50 mg total) by mouth every 6 (six) hours as needed for moderate pain. 04/19/17   Fritzi Mandes, MD    REVIEW OF SYSTEMS:  Review of Systems  Constitutional: Negative for chills, fever, malaise/fatigue and weight loss.  HENT: Negative for ear pain, hearing loss and tinnitus.   Eyes: Negative for blurred vision, double vision, pain and redness.  Respiratory: Negative for cough, hemoptysis and shortness of breath.   Cardiovascular: Negative for chest pain, palpitations, orthopnea and leg swelling.  Gastrointestinal: Negative  for abdominal pain, constipation, diarrhea, nausea and vomiting.  Genitourinary: Negative for dysuria, frequency and hematuria.  Musculoskeletal: Negative for back pain, joint pain and neck pain.  Skin:       Bilateral great toe ulcerative wounds  Neurological: Negative for dizziness, tremors, focal weakness and weakness.  Endo/Heme/Allergies: Negative for polydipsia. Does not bruise/bleed easily.  Psychiatric/Behavioral: Negative for depression. The patient is not nervous/anxious and does not have  insomnia.      VITAL SIGNS:   Vitals:   02/22/18 2130 02/22/18 2200 02/22/18 2230 02/22/18 2330  BP: 115/86 124/89 125/87 (!) 141/90  Pulse: 81 78 76 71  Resp: 14 12 10 11   Temp:      TempSrc:      SpO2: 100% 100% 100% 100%  Weight:       Wt Readings from Last 3 Encounters:  02/22/18 56.7 kg (125 lb)  02/05/18 56.7 kg (125 lb)  08/12/17 45.4 kg (100 lb)    PHYSICAL EXAMINATION:  Physical Exam  Vitals reviewed. Constitutional: He is oriented to person, place, and time. He appears well-developed and well-nourished. No distress.  HENT:  Head: Normocephalic and atraumatic.  Mouth/Throat: Oropharynx is clear and moist.  Eyes: Pupils are equal, round, and reactive to light. Conjunctivae and EOM are normal. No scleral icterus.  Neck: Normal range of motion. Neck supple. No JVD present. No thyromegaly present.  Cardiovascular: Normal rate, regular rhythm and intact distal pulses. Exam reveals no gallop and no friction rub.  No murmur heard. Respiratory: Effort normal and breath sounds normal. No respiratory distress. He has no wheezes. He has no rales.  GI: Soft. Bowel sounds are normal. He exhibits no distension. There is no tenderness.  Musculoskeletal: Normal range of motion. He exhibits no edema.  No arthritis, no gout  Lymphadenopathy:    He has no cervical adenopathy.  Neurological: He is alert and oriented to person, place, and time. No cranial nerve deficit.  No dysarthria, no aphasia  Skin: Skin is warm and dry. No rash noted. There is erythema (Surrounding right great toe ulcerative wound).  Bilateral great toe ulcerations  Psychiatric: He has a normal mood and affect. His behavior is normal. Judgment and thought content normal.    LABORATORY PANEL:   CBC Recent Labs  Lab 02/22/18 1925  WBC 7.6  HGB 10.0*  HCT 29.2*  PLT 427   ------------------------------------------------------------------------------------------------------------------  Chemistries   Recent Labs  Lab 02/22/18 1925  NA 128*  K 2.9*  CL 87*  CO2 27  GLUCOSE 620*  BUN 7  CREATININE 0.99  CALCIUM 8.5*  AST 15  ALT 9*  ALKPHOS 102  BILITOT 0.7   ------------------------------------------------------------------------------------------------------------------  Cardiac Enzymes No results for input(s): TROPONINI in the last 168 hours. ------------------------------------------------------------------------------------------------------------------  RADIOLOGY:  Dg Foot Complete Left  Result Date: 02/22/2018 CLINICAL DATA:  Wounds to the bilateral great toe for 1 week. Diabetes EXAM: LEFT FOOT - COMPLETE 3+ VIEW COMPARISON:  None. FINDINGS: Bandage along the great toe. Punctate densities in this area appears superficial. No embedded foreign body is suspected. No evidence of osteomyelitis. No incidental fracture. IMPRESSION: No evidence of osteomyelitis.  No soft tissue emphysema. Electronically Signed   By: Monte Fantasia M.D.   On: 02/22/2018 20:11   Dg Foot Complete Right  Result Date: 02/22/2018 CLINICAL DATA:  Wounds to the great toes bilaterally for 1 week. History of diabetes. EXAM: RIGHT FOOT COMPLETE - 3+ VIEW COMPARISON:  None. FINDINGS: There is soft tissue irregularity along the plantar  aspect of the great toe compatible with the provided history. No soft tissue emphysema or underlying osseous erosion is identified. No acute fracture or dislocation is seen. IMPRESSION: No evidence of active osteomyelitis or other acute osseous abnormality. Electronically Signed   By: Logan Bores M.D.   On: 02/22/2018 20:13    EKG:   Orders placed or performed during the hospital encounter of 02/05/18  . ED EKG  . ED EKG  . EKG 12-Lead  . EKG 12-Lead  . EKG    IMPRESSION AND PLAN:  Principal Problem:   Diabetic foot ulcer ([place]) -IV antibiotics started, podiatry consult ordered Active Problems:   Diabetes mellitus with hyperglycemia ([place]) -sliding scale insulin  with corresponding glucose checks, glucose was initially significantly elevated but came down quite a bit with IV fluids   Diabetic gastroparesis ([place]) -continue home dose Reglan   GERD (gastroesophageal reflux disease) -home dose H2 blocker  Chart review performed and case discussed with ED provider. Labs, imaging and/or ECG reviewed by provider and discussed with patient/family. Management plans discussed with the patient and/or family.  DVT PROPHYLAXIS: SubQ lovenox  GI PROPHYLAXIS: H2 blocker  ADMISSION STATUS: Observation  CODE STATUS: Full Code Status History    Date Active Date Inactive Code Status Order ID Comments User Context   04/22/2017 0004 04/22/2017 2213 Full Code [id number]  Lance Coon, MD Inpatient   04/17/2017 2234 04/19/2017 2005 Full Code [id number]  [name], MD Inpatient   04/06/2016 1907 04/10/2016 1928 Full Code [id number]  Loletha Grayer, MD ED      TOTAL TIME TAKING CARE OF THIS PATIENT: 40 minutes.   Carlise Stofer [place] 02/23/2018, 12:54 AM  [facility]  Office  [phone]  CC: Primary care physician; Department, [facility]  Note:  This document was prepared using Systems analyst and may include unintentional dictation errors.

## 2018-02-23 NOTE — Consult Note (Signed)
[facility] Podiatry                                                      Patient Demographics  Gregory Moody, is a 42 y.o. male   MRN: [id number]   DOB - 1976-04-30  Admit Date - 02/22/2018    Outpatient Primary MD for the patient is Department, [facility] requested in the Hospital by Bettey Costa, MD, On 02/23/2018    Reason for consult diabetic ulcerations both toes   With History of -  Past Medical History:  Diagnosis Date  . Diabetes mellitus without complication ([place])   . Gastroparesis   . Tuberculosis       Past Surgical History:  Procedure Laterality Date  . NO PAST SURGERIES      in for   Chief Complaint  Patient presents with  . Wound Infection     HPI  Gregory Moody  is a 42 y.o. male, patient is a poorly controlled type I diabetic.  He states he takes his 70/30 insulin injects himself.  Hemoglobin A1c was over 12.  Presents today with wounds to the distal portions of both great toes.  He thinks they have been there for about a week but is not sure.    Review of Systems interpreter is present today.  He is alert well oriented and pleasant  In addition to the HPI above,  No Fever-chills, No Headache, No changes with Vision or hearing, No problems swallowing food or Liquids, No Chest pain, Cough or Shortness of Breath, No Abdominal pain, No Nausea or Vommitting, Bowel movements are regular, No Blood in stool or Urine, No dysuria, No new skin rashes or bruises, see foot exam for ulcer descriptions No new joints pains-aches,  No new weakness, tingling, numbness in any extremity, No recent weight gain or loss, No polyuria, polydypsia or polyphagia, No significant Mental Stressors.  A full 10 point Review of Systems was done, except as stated above, all other Review of Systems were  negative.   Social History Social History   Tobacco Use  . Smoking status: Never Smoker  . Smokeless tobacco: Never Used  Substance Use Topics  . Alcohol use: No    Family History Family History  Problem Relation Age of Onset  . Diabetes Mother   . Diabetes Father     Prior to Admission medications   Medication Sig Start Date End Date Taking? Authorizing Provider  aluminum-magnesium hydroxide-simethicone (MAALOX) [phone] MG/5ML SUSP Take 30 mLs by mouth 4 (four) times daily -  before meals and at bedtime. 08/12/17   Carrie Mew, MD  amoxicillin-clavulanate (AUGMENTIN) 875-125 MG tablet Take 1 tablet by mouth every 12 (twelve) hours. Patient not taking: Reported on 08/12/2017 04/22/17   Epifanio Lesches, MD  cephALEXin (KEFLEX) 500 MG capsule Take 1 capsule (500 mg total) by mouth 3 (three) times daily for 10 days. 02/22/18 03/04/18  Merlyn Lot, MD  famotidine (PEPCID) 20 MG tablet Take 1 tablet (20 mg total) by mouth 2 (two) times daily. 08/12/17   Carrie Mew, MD  feeding supplement, ENSURE ENLIVE, (ENSURE ENLIVE) LIQD Take 237 mLs by mouth 2 (two) times daily with a meal. 04/19/17   Fritzi Mandes, MD  hydrocortisone 2.5 % ointment Apply 1 application topically 2 (two) times daily as needed for rash.  [provider]  insulin NPH-regular Human (NOVOLIN 70/30) (70-30) 100 UNIT/ML injection Inject 35 Units into the skin 2 (two) times daily with a meal. 04/22/17   Fritzi Mandes, MD  metoCLOPramide (REGLAN) 10 MG tablet Take 1 tablet (10 mg total) by mouth every 6 (six) hours as needed. 08/12/17   Carrie Mew, MD  Multiple Vitamin (MULTIVITAMIN WITH MINERALS) TABS tablet Take 1 tablet by mouth daily. 04/20/17   Fritzi Mandes, MD  traMADol (ULTRAM) 50 MG tablet Take 1 tablet (50 mg total) by mouth every 6 (six) hours as needed for moderate pain. 04/19/17   Fritzi Mandes, MD    Anti-infectives (From admission, onward)   Start     Dose/Rate Route Frequency  Ordered Stop   02/23/18 0600  piperacillin-tazobactam (ZOSYN) IVPB 3.375 g     3.375 g 12.5 mL/hr over 240 Minutes Intravenous Every 8 hours 02/23/18 0505     02/23/18 0000  Ampicillin-Sulbactam (UNASYN) 3 g in sodium chloride 0.9 % 100 mL IVPB     3 g 200 mL/hr over 30 Minutes Intravenous  Once 02/22/18 2348 02/23/18 0042   02/22/18 2345  amoxicillin-clavulanate (AUGMENTIN) 875-125 MG per tablet 1 tablet  Status:  Discontinued     1 tablet Oral  Once 02/22/18 2341 02/22/18 2348   02/22/18 2115  clindamycin (CLEOCIN) capsule 300 mg  Status:  Discontinued     300 mg Oral  Once 02/22/18 2106 02/22/18 2107   02/22/18 2115  cephALEXin (KEFLEX) capsule 500 mg     500 mg Oral  Once 02/22/18 2107 02/22/18 2211   02/22/18 0000  cephALEXin (KEFLEX) 500 MG capsule     500 mg Oral 3 times daily 02/22/18 2242 03/04/18 2359      Scheduled Meds: . enoxaparin (LOVENOX) injection  40 mg Subcutaneous Q24H  . famotidine  20 mg Oral BID  . insulin aspart  0-15 Units Subcutaneous TID WC  . insulin aspart  0-5 Units Subcutaneous QHS  . [START ON 02/24/2018] pneumococcal 23 valent vaccine  0.5 mL Intramuscular Tomorrow-1000   Continuous Infusions: . piperacillin-tazobactam (ZOSYN)  IV Stopped (02/23/18 1050)   PRN Meds:.acetaminophen **OR** acetaminophen, metoCLOPramide, oxyCODONE  No Known Allergies  Physical Exam  Vitals  Blood pressure 123/90, pulse 66, temperature 97.6 F (36.4 C), resp. rate 19, height 5\' 8"  (1.727 m), weight 57 kg (125 lb 9.6 oz), SpO2 100 %.  Lower Extremity exam:  Vascular: DP and PT pulses are +2/4 bilaterally that are very palpable.  Dermatological: Patient has ulcerations at the distal portions of both great toes.  The left hallux has a skin tear that is about 2 cm diameter with only a little bit of depth but there is an area on the more plantar medial aspect of the distal portion of the great toe is approximately 1 cm diameter that has necrotic tissue centrally  located .  On the right great toe there is an ulcerative change that has necrosis and represents an area about 1.8 cm in width 1.5 cm in length and uncertain depth.  There is not really any cellulitis or progressive tracking to the wound.  Appear to be concentrated distally on the digits.  Neurological: Patient has significant peripheral neuropathy really does not have any feeling to his feet at all.  Ortho: Some hallux malleus changes to both feet.  This creates increased pressure on the tips of the toes.  Data Review  CBC Recent Labs  Lab 02/22/18 1925 02/23/18 0444  WBC 7.6 6.6  HGB 10.0* 10.5*  HCT 29.2* 30.2*  PLT 427 440  MCV 92.0 90.9  MCH 31.5 31.7  MCHC 34.2 34.8  RDW 12.2 12.2  LYMPHSABS 1.2  --   MONOABS 0.4  --   EOSABS 0.0  --   BASOSABS 0.0  --    ------------------------------------------------------------------------------------------------------------------  Chemistries  Recent Labs  Lab 02/22/18 1925 02/23/18 0444  NA 128* 134*  K 2.9* 3.5  CL 87* 95*  CO2 27 30  GLUCOSE 620* 363*  BUN 7 <5*  CREATININE 0.99 0.68  CALCIUM 8.5* 8.5*  AST 15  --   ALT 9*  --   ALKPHOS 102  --   BILITOT 0.7  --    ------------------------------------------------------------------------------------------------------------------ estimated creatinine clearance is 97 mL/min (by C-G formula based on SCr of 0.68 mg/dL). ------------------------------------------------------------------------------------------------------------------ No results for input(s): TSH, T4TOTAL, T3FREE, THYROIDAB in the last 72 hours.  Invalid input(s): FREET3 Urinalysis    Component Value Date/Time   COLORURINE STRAW (A) 02/22/2018 2322   APPEARANCEUR HAZY (A) 02/22/2018 2322   LABSPEC 1.020 02/22/2018 2322   PHURINE 6.0 02/22/2018 2322   GLUCOSEU >=500 (A) 02/22/2018 2322   HGBUR NEGATIVE 02/22/2018 2322   BILIRUBINUR NEGATIVE 02/22/2018 2322   KETONESUR 20 (A) 02/22/2018 2322    PROTEINUR NEGATIVE 02/22/2018 2322   NITRITE NEGATIVE 02/22/2018 2322   LEUKOCYTESUR SMALL (A) 02/22/2018 2322     Imaging results:   Dg Foot Complete Left  Result Date: 02/22/2018 CLINICAL DATA:  Wounds to the bilateral great toe for 1 week. Diabetes EXAM: LEFT FOOT - COMPLETE 3+ VIEW COMPARISON:  None. FINDINGS: Bandage along the great toe. Punctate densities in this area appears superficial. No embedded foreign body is suspected. No evidence of osteomyelitis. No incidental fracture. IMPRESSION: No evidence of osteomyelitis.  No soft tissue emphysema. Electronically Signed   By: Monte Fantasia M.D.   On: 02/22/2018 20:11   Dg Foot Complete Right  Result Date: 02/22/2018 CLINICAL DATA:  Wounds to the great toes bilaterally for 1 week. History of diabetes. EXAM: RIGHT FOOT COMPLETE - 3+ VIEW COMPARISON:  None. FINDINGS: There is soft tissue irregularity along the plantar aspect of the great toe compatible with the provided history. No soft tissue emphysema or underlying osseous erosion is identified. No acute fracture or dislocation is seen. IMPRESSION: No evidence of active osteomyelitis or other acute osseous abnormality. Electronically Signed   By: Logan Bores M.D.   On: 02/22/2018 20:13    Assessment & Plan: Wounds are contained to the distal portions of the great toes.  Both have some necrosis to the skin on those areas.  The right great toe is a larger area than the left great toe.  Post debridement of the right great toe measures 2.1 cm in width and 1.9 cm in length with approximately the 3 to 4 mm of depth.  On the left great toe the post debridement measurements are 1.4 cm in diameter with 4 mm of depth.  Was able to remove the necrotic moist eschar to both regions.  Debridement was accomplished and an excisional standpoint utilizing a 15 scalpel blade and tissue nippers to remove the necrotic tissue.  As noted this was an excisional debridement to both great toes.  Following debridement  applied a wet-to-dry saline dressing to both great toes with significant gauze padding.  He will need to continue this dressing change on a daily basis.  Also ordered postoperative shoes for him to utilize.  Should not walk at all  without the postop shoes on.  Dressing changes should consist of one layer of gauze wet it with sterile saline then apply approximately 4 5 layers of gauze and wrapped with a heavy gauze wrap such as Kerlix.  Dressing needs to be changed on a daily basis.  I would like for him to return to my office next week for evaluation and dressing changes.  An oral antibiotic such as Augmentin would be appreciated as well.  Principal Problem:   Diabetic foot ulcer ([place]) Active Problems:   Diabetes mellitus with hyperglycemia ([facility])   GERD (gastroesophageal reflux disease)   Diabetic gastroparesis ([place])   Family Communication: Plan discussed with patient in the presence and with the assistance of an interpreter  Albertine Patricia M.D on 02/23/2018 at 12:08 PM  Thank you for the consult, we will follow the patient with you in the Hospital.

## 2018-02-24 DIAGNOSIS — E440 Moderate protein-calorie malnutrition: Secondary | ICD-10-CM

## 2018-02-24 LAB — GLUCOSE, CAPILLARY: GLUCOSE-CAPILLARY: 321 mg/dL — AB (ref 65–99)

## 2018-02-24 MED ORDER — INSULIN NPH ISOPHANE & REGULAR (70-30) 100 UNIT/ML ~~LOC~~ SUSP: 55.0000 [IU] | Freq: Two times a day (BID) | SUBCUTANEOUS | 0 refills | Status: DC

## 2018-02-24 MED ORDER — AMOXICILLIN-POT CLAVULANATE 875-125 MG PO TABS
1.0000 | ORAL_TABLET | Freq: Two times a day (BID) | ORAL | 0 refills | Status: AC
Start: 2018-02-24 — End: 2018-03-06

## 2018-02-24 NOTE — Discharge Summary (Addendum)
[facility] NAME: Gregory Moody    MR#:  [id number]  DATE OF BIRTH:  06-15-1976  DATE OF ADMISSION:  02/22/2018 ADMITTING PHYSICIAN: Lance Coon, MD  DATE OF DISCHARGE: 02/24/2018  PRIMARY CARE PHYSICIAN: [place]    ADMISSION DIAGNOSIS:  Hyperglycemia [R73.9] Diabetic ulcer of toe of left foot associated with type 2 diabetes mellitus, limited to breakdown of skin ([place]) [Z30.865, L97.521] Diabetic ulcer of toe of right foot associated with type 2 diabetes mellitus, limited to breakdown of skin ([place]) [H84.696, L97.511]  DISCHARGE DIAGNOSIS:  Principal Problem:   Diabetic foot ulcer ([place]) Active Problems:   Diabetes mellitus with hyperglycemia ([place])   GERD (gastroesophageal reflux disease)   Diabetic gastroparesis (HCC)   Malnutrition of moderate degree   SECONDARY DIAGNOSIS:   Past Medical History:  Diagnosis Date  . Diabetes mellitus without complication ([place])   . Gastroparesis   . Tuberculosis     HOSPITAL COURSE:   42 year old male with diabetes who presented with ulcerations of both great toes.   1.  Diabetic foot wounds/ulcer: Patient was evaluated by podiatry.  He had necrotic tissue removed.  He underwent excisional debridement to both great toes. He will continue Augmentin upon discharge.  He will have outpatient follow-up with podiatry.  He will continue daily dressings as recommended by podiatry. Dressing changes should consist of one layer of gauze wet it with sterile saline then apply approximately 4 5 layers of gauze and wrapped with a heavy gauze wrap such as Kerlix.  Dressing needs to be changed on a daily basis. He should not walk at all without the postop shoes.  2.  Uncontrolled diabetes with A1c of 12.1: Patient will need close follow-up with his primary care physician. He will continue with insulin  3.Hyponatremia which corrected with elevated blood sugar  4.  Elevated  blood pressure without diagnosis of hypertension: Patient will need outpatient follow-up for his blood pressure.  DISCHARGE CONDITIONS AND DIET:   Stable for discharge on diabetic diet  CONSULTS OBTAINED:    DRUG ALLERGIES:  No Known Allergies  DISCHARGE MEDICATIONS:   Allergies as of 02/24/2018   No Known Allergies     Medication List    STOP taking these medications   CAPSAICIN HP 0.1 % Crea Generic drug:  Capsaicin   famotidine 20 MG tablet Commonly known as:  PEPCID   metoCLOPramide 10 MG tablet Commonly known as:  REGLAN   ranitidine 150 MG tablet Commonly known as:  ZANTAC     TAKE these medications   amoxicillin-clavulanate 875-125 MG tablet Commonly known as:  AUGMENTIN Take 1 tablet by mouth 2 (two) times daily for 10 days.   insulin NPH-regular Human (70-30) 100 UNIT/ML injection Commonly known as:  NOVOLIN 70/30 Inject 55 Units into the skin 2 (two) times daily before a meal.            Discharge Care Instructions  (From admission, onward)        Start     Ordered   02/24/18 0000  Discharge wound care:    Comments:  Dressing changes should consist of one layer of gauze wet it with sterile saline then apply approximately 4 5 layers of gauze and wrapped with a heavy gauze wrap such as Kerlix.  Dressing needs to be changed on a daily basis.   02/24/18 0842        Today   CHIEF COMPLAINT:  No acute issues  overnight.  Patient is ready for discharge   VITAL SIGNS:  Blood pressure (!) 152/94, pulse 74, temperature 98.5 F (36.9 C), temperature source Oral, resp. rate 19, height 5\' 8"  (1.727 m), weight 57 kg (125 lb 9.6 oz), SpO2 100 %.   REVIEW OF SYSTEMS:  Review of Systems  Constitutional: Negative.  Negative for chills, fever and malaise/fatigue.  HENT: Negative.  Negative for ear discharge, ear pain, hearing loss, nosebleeds and sore throat.   Eyes: Negative.  Negative for blurred vision and pain.  Respiratory: Negative.  Negative  for cough, hemoptysis, shortness of breath and wheezing.   Cardiovascular: Negative.  Negative for chest pain, palpitations and leg swelling.  Gastrointestinal: Negative.  Negative for abdominal pain, blood in stool, diarrhea, nausea and vomiting.  Genitourinary: Negative.  Negative for dysuria.  Musculoskeletal: Negative.  Negative for back pain.  Skin: Negative.   Neurological: Negative for dizziness, tremors, speech change, focal weakness, seizures and headaches.  Endo/Heme/Allergies: Negative.  Does not bruise/bleed easily.  Psychiatric/Behavioral: Negative.  Negative for depression, hallucinations and suicidal ideas.     PHYSICAL EXAMINATION:  GENERAL:  42 y.o.-year-old patient lying in the bed with no acute distress.  NECK:  Supple, no jugular venous distention. No thyroid enlargement, no tenderness.  LUNGS: Normal breath sounds bilaterally, no wheezing, rales,rhonchi  No use of accessory muscles of respiration.  CARDIOVASCULAR: S1, S2 normal. No murmurs, rubs, or gallops.  ABDOMEN: Soft, non-tender, non-distended. Bowel sounds present. No organomegaly or mass.  EXTREMITIES: No pedal edema, cyanosis, or clubbing.  PSYCHIATRIC: The patient is alert and oriented x 3.  SKIN: both feet bandaged  DATA REVIEW:   CBC Recent Labs  Lab 02/23/18 0444  WBC 6.6  HGB 10.5*  HCT 30.2*  PLT 440    Chemistries  Recent Labs  Lab 02/22/18 1925 02/23/18 0444  NA 128* 134*  K 2.9* 3.5  CL 87* 95*  CO2 27 30  GLUCOSE 620* 363*  BUN 7 <5*  CREATININE 0.99 0.68  CALCIUM 8.5* 8.5*  AST 15  --   ALT 9*  --   ALKPHOS 102  --   BILITOT 0.7  --     Cardiac Enzymes No results for input(s): TROPONINI in the last 168 hours.  Microbiology Results  @MICRORSLT48 @  RADIOLOGY:  Dg Foot Complete Left  Result Date: 02/22/2018 CLINICAL DATA:  Wounds to the bilateral great toe for 1 week. Diabetes EXAM: LEFT FOOT - COMPLETE 3+ VIEW COMPARISON:  None. FINDINGS: Bandage along the great toe.  Punctate densities in this area appears superficial. No embedded foreign body is suspected. No evidence of osteomyelitis. No incidental fracture. IMPRESSION: No evidence of osteomyelitis.  No soft tissue emphysema. Electronically Signed   By: Monte Fantasia M.D.   On: 02/22/2018 20:11   Dg Foot Complete Right  Result Date: 02/22/2018 CLINICAL DATA:  Wounds to the great toes bilaterally for 1 week. History of diabetes. EXAM: RIGHT FOOT COMPLETE - 3+ VIEW COMPARISON:  None. FINDINGS: There is soft tissue irregularity along the plantar aspect of the great toe compatible with the provided history. No soft tissue emphysema or underlying osseous erosion is identified. No acute fracture or dislocation is seen. IMPRESSION: No evidence of active osteomyelitis or other acute osseous abnormality. Electronically Signed   By: Logan Bores M.D.   On: 02/22/2018 20:13      Allergies as of 02/24/2018   No Known Allergies     Medication List    STOP taking these medications  CAPSAICIN HP 0.1 % Crea Generic drug:  Capsaicin   famotidine 20 MG tablet Commonly known as:  PEPCID   metoCLOPramide 10 MG tablet Commonly known as:  REGLAN   ranitidine 150 MG tablet Commonly known as:  ZANTAC     TAKE these medications   amoxicillin-clavulanate 875-125 MG tablet Commonly known as:  AUGMENTIN Take 1 tablet by mouth 2 (two) times daily for 10 days.   insulin NPH-regular Human (70-30) 100 UNIT/ML injection Commonly known as:  NOVOLIN 70/30 Inject 55 Units into the skin 2 (two) times daily before a meal.            Discharge Care Instructions  (From admission, onward)        Start     Ordered   02/24/18 0000  Discharge wound care:    Comments:  Dressing changes should consist of one layer of gauze wet it with sterile saline then apply approximately 4 5 layers of gauze and wrapped with a heavy gauze wrap such as Kerlix.  Dressing needs to be changed on a daily basis.   02/24/18 0842          Management plans discussed with the patient and he is in agreement. Stable for discharge home  Patient should follow up with podiatry  CODE STATUS:     Code Status Orders  (From admission, onward)        Start     Ordered   02/23/18 0127  Full code  Continuous     02/23/18 0126    Code Status History    Date Active Date Inactive Code Status Order ID Comments User Context   04/22/2017 0004 04/22/2017 2213 Full Code [id number]  Lance Coon, MD Inpatient   04/17/2017 2234 04/19/2017 2005 Full Code [id number]  [name], MD Inpatient   04/06/2016 1907 04/10/2016 1928 Full Code [id number]  Loletha Grayer, MD ED      TOTAL TIME TAKING CARE OF THIS PATIENT: 38 minutes.    Note: This dictation was prepared with Dragon dictation along with smaller phrase technology. Any transcriptional errors that result from this process are unintentional.  Rylen Hou M.D on 02/24/2018 at 9:18 AM  Between 7am to 6pm - Pager - [phone] After 6pm go to [email or web address] - password [email or web address] [facility]  Office  [phone]  CC: Primary care physician; Manuel Garcia

## 2018-02-24 NOTE — Care Management Note (Addendum)
Case Management Note  Patient Details  Name: Gregory Moody MRN: [id number] Date of Birth: 17-Oct-1975  Subjective/Objective:    Met with patient at bedside. He states his PCP is at [facility], Dr. Jimmie Molly (?). Last seen 1 week ago and has a follow up appointment May 22.  He lives at home with his 43 year old daughter who will be able to assist him. He is uninsured. Will need home health RN and SW with [facility] program. SW ordered to assist with Medicaid application. Referral to [facility] with [facility]. Patient will need daily dressing changes. He and is daughter is able to perform these with education. Application given for [facility] and medication management clinic. Patient does not want to switch to [facility] but will go to medication management.                Action/Plan:   Expected Discharge Date:  02/24/18               Expected Discharge Plan:  [place]  In-House Referral:     Discharge planning Services  CM Consult  Post Acute Care Choice:  Home Health Choice offered to:  Patient  DME Arranged:    DME Agency:     HH Arranged:  RN, Social Work [employer] Agency:  [place]  Status of Service:  Completed, signed off  If discussed at [employer], dates discussed:    Additional Comments:  Jolly Mango, RN 02/24/2018, 9:27 AM

## 2018-02-24 NOTE — Progress Notes (Signed)
Inpatient Diabetes Program Recommendations  AACE/ADA: New Consensus Statement on Inpatient Glycemic Control (2015)  Target Ranges:  Prepandial:   less than 140 mg/dL      Peak postprandial:   less than 180 mg/dL (1-2 hours)      Critically ill patients:  140 - 180 mg/dL   Results for Gregory Moody, Gregory Moody (MRN [id number]) as of 02/24/2018 09:46  Ref. Range 02/23/2018 00:13 02/23/2018 03:01 02/23/2018 08:02 02/23/2018 11:46 02/23/2018 16:57 02/23/2018 21:01 02/23/2018 22:45  Glucose-Capillary Latest Ref Range: 65 - 99 mg/dL 233 (H) 383 (H) 276 (H) 190 (H) 208 (H) 209 (H) 233 (H)   Results for Gregory Moody, Gregory Moody (MRN [id number]) as of 02/24/2018 09:46  Ref. Range 02/24/2018 07:32  Glucose-Capillary Latest Ref Range: 65 - 99 mg/dL 321 (H)   Results for Gregory Moody, Gregory Moody (MRN [id number]) as of 02/24/2018 09:46  Ref. Range 02/23/2018 04:44  Hemoglobin A1C Latest Ref Range: 4.8 - 5.6 % 12.1 (H)  Average glucose 300 mg/dl    Admit with: Diabetic foot ulcer   History: DM  Home DM Meds: 70/30 Insulin- 55 units BID   Current Insulin Orders: Lantus 15 units daily      Novolog Moderate Correction Scale/ SSI (0-15 units) TID AC + HS      Novolog 4 units TID with meals    PCP: Dr. Ladoris Gene with [facility]     Met with patient earlier before discharge to ask about insulin admin at home.  Patient stated he speaks English only a little bit so I asked my questions to him in [place].  I asked how much insulin he is taking.  He told me 55 units of the 70/30 insulin twice daily with breakfast and dinner.  Has follow up appointment with Dr. Ladoris Gene on 03/01/18.  I asked patient if he remembers to rotate injection sites and he stated yes he does rotate the sites.  I also asked patient if he had any questions regarding his diabetes and he stated No and that he was ready to go home.    --Will follow patient during  hospitalization--  Wyn Quaker RN, MSN, CDE Diabetes Coordinator Inpatient Glycemic Control Team Team Pager: [phone] (8a-5p)

## 2018-02-24 NOTE — Progress Notes (Signed)
This note is made at [facility] 02/24/18  Pt left his discharge instructions in room when he left. Manuela Schwartz the secretary called sister Lattie Haw who answered and said she would pick it up. RN and Network engineer both also called pt phone and other sister listed and received no answer. Pts sister Lattie Haw still has not picked up discharge instructions at 1812. Manuela Schwartz the secretary will call again tomorrow morning and mail the discharge packet to the pt if no response from pt or family.

## 2018-02-24 NOTE — Progress Notes (Signed)
RN explained discharge instructions to pt in detail. Information provided in [place] and [place]. RN explained and demonstrated dressing change for pt. VSS. Pt in no acute distress. IV removed. Pt assisted to dress and wheeled to visitors entrance by NT. Assisted into daughters vehicle.

## 2018-06-20 ENCOUNTER — Emergency Department: Payer: Self-pay

## 2018-06-20 ENCOUNTER — Emergency Department
Admission: EM | Admit: 2018-06-20 | Discharge: 2018-06-21 | Disposition: A | Discharge status: Home / Self Care | Payer: Self-pay | Attending: Emergency Medicine | Admitting: Emergency Medicine

## 2018-06-20 ENCOUNTER — Encounter: Payer: Self-pay | Admitting: Emergency Medicine

## 2018-06-20 DIAGNOSIS — E1165 Type 2 diabetes mellitus with hyperglycemia: Secondary | ICD-10-CM | POA: Insufficient documentation

## 2018-06-20 DIAGNOSIS — R404 Transient alteration of awareness: Secondary | ICD-10-CM

## 2018-06-20 DIAGNOSIS — E162 Hypoglycemia, unspecified: Secondary | ICD-10-CM | POA: Insufficient documentation

## 2018-06-20 LAB — CBC WITH DIFFERENTIAL/PLATELET
BASOS PCT: 0 %
Basophils Absolute: 0 10*3/uL (ref 0–0.1)
EOS ABS: 0 10*3/uL (ref 0–0.7)
Eosinophils Relative: 0 %
HEMATOCRIT: 34.1 % — AB (ref 40.0–52.0)
HEMOGLOBIN: 11.9 g/dL — AB (ref 13.0–18.0)
LYMPHS ABS: 0.7 10*3/uL — AB (ref 1.0–3.6)
Lymphocytes Relative: 10 %
MCH: 32.2 pg (ref 26.0–34.0)
MCHC: 34.8 g/dL (ref 32.0–36.0)
MCV: 92.5 fL (ref 80.0–100.0)
MONO ABS: 0.2 10*3/uL (ref 0.2–1.0)
MONOS PCT: 3 %
NEUTROS PCT: 87 %
Neutro Abs: 6.5 10*3/uL (ref 1.4–6.5)
Platelets: 301 10*3/uL (ref 150–440)
RBC: 3.68 MIL/uL — ABNORMAL LOW (ref 4.40–5.90)
RDW: 13.9 % (ref 11.5–14.5)
WBC: 7.5 10*3/uL (ref 3.8–10.6)

## 2018-06-20 LAB — COMPREHENSIVE METABOLIC PANEL
ALK PHOS: 74 U/L (ref 38–126)
ALT: 20 U/L (ref 0–44)
ANION GAP: 5 (ref 5–15)
AST: 33 U/L (ref 15–41)
Albumin: 3.4 g/dL — ABNORMAL LOW (ref 3.5–5.0)
BILIRUBIN TOTAL: 0.3 mg/dL (ref 0.3–1.2)
BUN: 13 mg/dL (ref 6–20)
CO2: 27 mmol/L (ref 22–32)
Calcium: 8.6 mg/dL — ABNORMAL LOW (ref 8.9–10.3)
Chloride: 100 mmol/L (ref 98–111)
Creatinine, Ser: 0.56 mg/dL — ABNORMAL LOW (ref 0.61–1.24)
GFR calc non Af Amer: >60 mL/min (ref >60)
Glucose, Bld: 38 mg/dL — CL (ref 70–99)
Potassium: 2.8 mmol/L — ABNORMAL LOW (ref 3.5–5.1)
Sodium: 132 mmol/L — ABNORMAL LOW (ref 135–145)
Total Protein: 7.3 g/dL (ref 6.5–8.1)

## 2018-06-20 LAB — GLUCOSE, CAPILLARY: GLUCOSE-CAPILLARY: 178 mg/dL — AB (ref 70–99)

## 2018-06-20 IMAGING — DX DG CHEST 1V PORT
1 series · 1 of 1 positions shown · non-contrast
Comparison: [DATE]

CLINICAL DATA: Unresponsive

EXAM:
PORTABLE CHEST 1 VIEW

[chest ap]
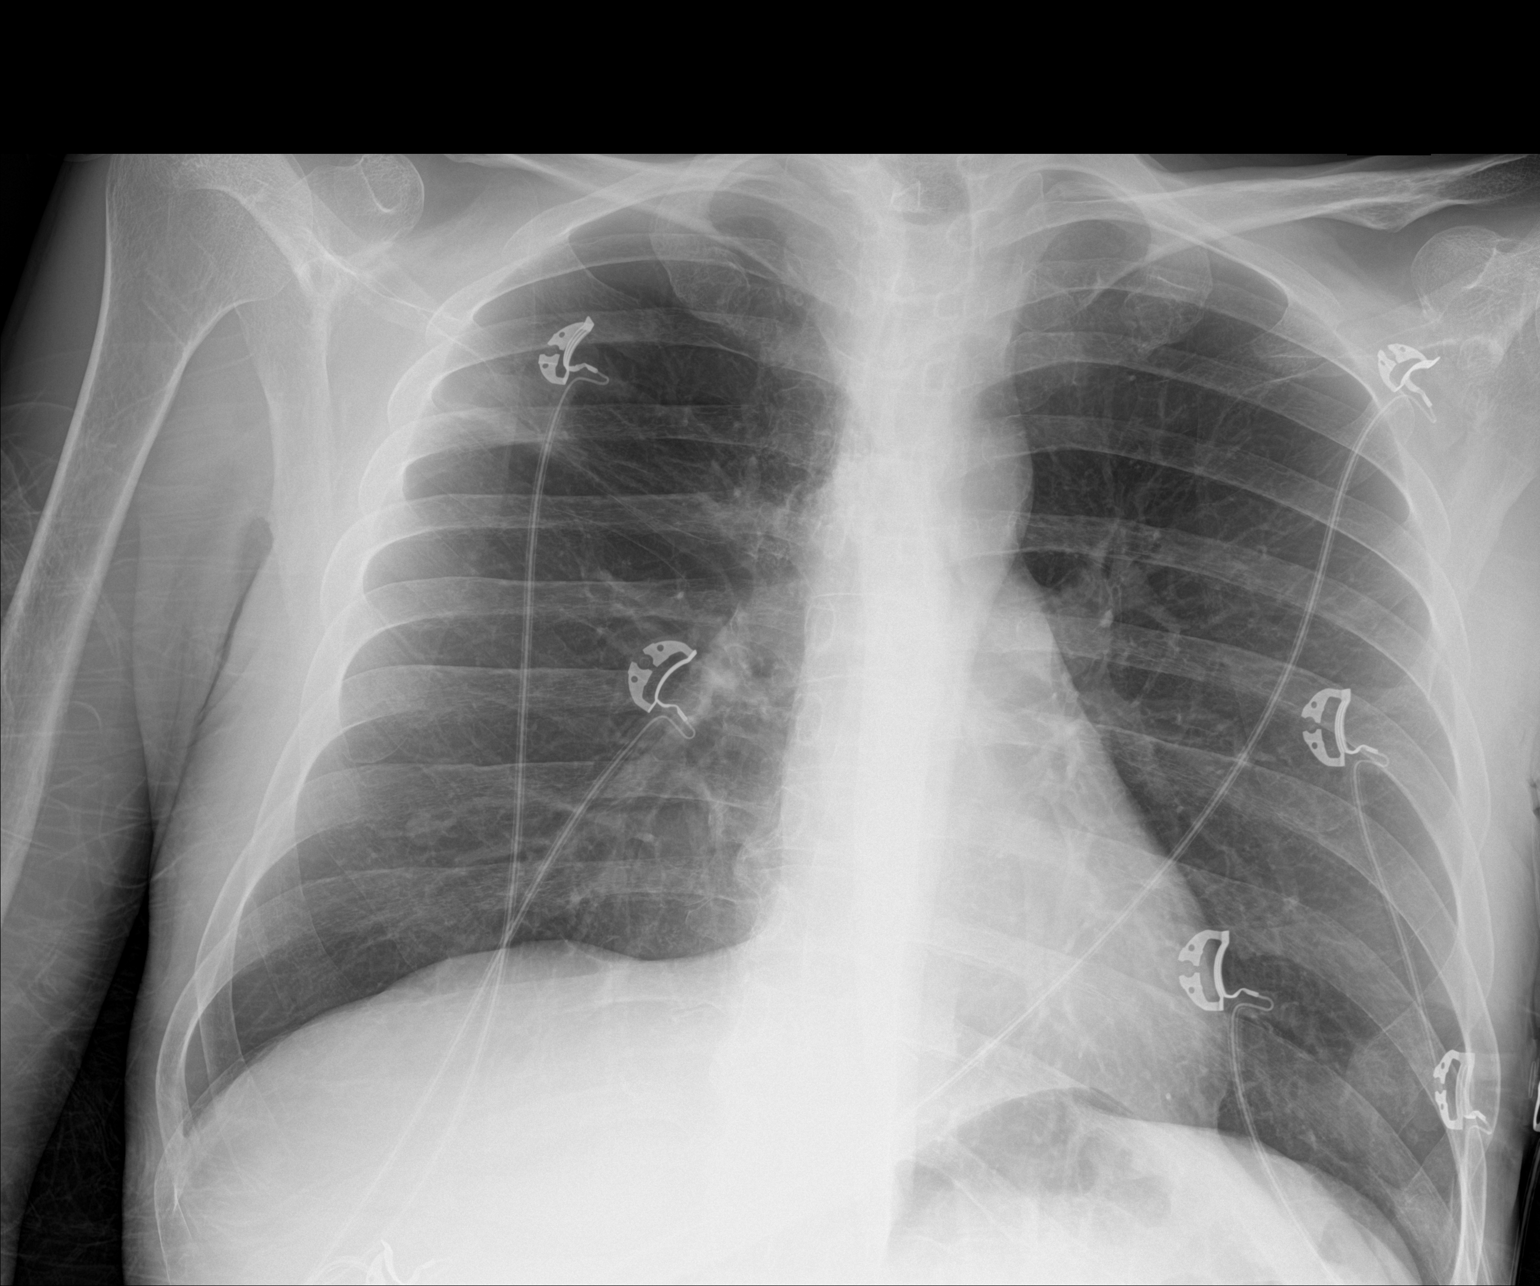

[1 of 1 positions shown; findings below may reference images not displayed]

FINDINGS: Stable pulmonary parenchymal opacity in the inferior and lateral
right upper lobe. Stable nipple shadow at the right lung base. Left
lung is clear. No pneumothorax or pleural effusion. Normal heart
size. Normal vascularity.
IMPRESSION: Stable chronic opacity in the right upper lobe. No interval change
since the prior study.

## 2018-06-20 MED ORDER — SODIUM CHLORIDE 0.9 % IV BOLUS
500.0000 mL | Freq: Once | INTRAVENOUS | Status: AC
  Administered 2018-06-20: 500 mL via INTRAVENOUS

## 2018-06-20 MED ORDER — DEXTROSE 50 % IV SOLN
1.0000 | Freq: Once | INTRAVENOUS | Status: AC
  Administered 2018-06-20: 50 mL via INTRAVENOUS
  Filled 2018-06-20: qty 50

## 2018-06-20 NOTE — ED Notes (Signed)
Pt given sandwich tray, diet coke, and 2 blankets as requested. Pt remains A&Ox4, talking with family and Probation officer.

## 2018-06-20 NOTE — ED Provider Notes (Signed)
[facility] Emergency Department Provider Note    First MD Initiated Contact with Patient 06/20/18 2147     (approximate)  I have reviewed the triage vital signs and the nursing notes.   HISTORY  Chief Complaint Loss of Consciousness and Altered Mental Status    HPI Gregory Moody is a 42 y.o. male with a history of diabetes as well as TB presents the ER for altered mental status.  Patient did not have any intake today.  Patient arrives unresponsive.   No additional history provided.  Patient taken emergently back to room 6.  Currently protecting his airway but not following commands very weak.  CBG 27.  Family reports very poor intake today.  Took insulin NPH-REGULAR (HUMULIN 70/30 U-100 INSULIN) 100 unit/mL (70-30) injection, Inject subcutaneously, this AM.   Past Medical History:  Diagnosis Date  . Diabetes mellitus without complication ([place])   . Gastroparesis   . Tuberculosis    Family History  Problem Relation Age of Onset  . Diabetes Mother   . Diabetes Father    Past Surgical History:  Procedure Laterality Date  . NO PAST SURGERIES     Patient Active Problem List   Diagnosis Date Noted  . Malnutrition of moderate degree 02/24/2018  . GERD (gastroesophageal reflux disease) 02/23/2018  . Diabetic gastroparesis ([place]) 02/23/2018  . Diabetic foot ulcer ([place]) 02/23/2018  . Aspiration pneumonia ([place]) 04/21/2017  . Hypoglycemia 04/21/2017  . Diabetes mellitus with hyperglycemia ([place]) 04/21/2017  . Hip fracture, unspecified laterality, closed, initial encounter ([place]) 04/17/2017  . Hyperglycemia 04/17/2017  . Protein-calorie malnutrition, severe 04/09/2016  . Cavitary lesion of lung 04/06/2016      Prior to Admission medications   Medication Sig Start Date End Date Taking? Authorizing Provider  insulin NPH-regular Human (NOVOLIN 70/30) (70-30) 100 UNIT/ML injection Inject 55 Units into the skin 2 (two) times daily before a meal.  02/24/18 03/27/18  Bettey Costa, MD    Allergies Patient has no known allergies.    Social History Social History   Tobacco Use  . Smoking status: Never Smoker  . Smokeless tobacco: Never Used  Substance Use Topics  . Alcohol use: No  . Drug use: No    Review of Systems Patient denies headaches, rhinorrhea, blurry vision, numbness, shortness of breath, chest pain, edema, cough, abdominal pain, nausea, vomiting, diarrhea, dysuria, fevers, rashes or hallucinations unless otherwise stated above in HPI. ____________________________________________   PHYSICAL EXAM:  VITAL SIGNS: Vitals:   06/20/18 2300 06/20/18 2330  BP: 98/68 126/79  Resp: 18 13    Constitutional: cachectic appearing, not following commands, protecting his airway Eyes: Conjunctivae are normal.  Head: Atraumatic. Nose: No congestion/rhinnorhea. Mouth/Throat: Mucous membranes are moist.   Neck: No stridor. Painless ROM.  Cardiovascular: Normal rate, regular rhythm. Grossly normal heart sounds.  Good peripheral circulation. Respiratory: Normal respiratory effort.  No retractions. Lungs CTAB. Gastrointestinal: Soft and nontender. No distention. No abdominal bruits. No CVA tenderness. Genitourinary: normal external genitalia Musculoskeletal: No lower extremity tenderness nor edema.  No joint effusions. Neurologic:  Normal speech and language. No gross focal neurologic deficits are appreciated. No facial droop Skin:  Skin is warm, dry and intact. No rash noted. Psychiatric: Mood and affect are normal. Speech and behavior are normal.  ____________________________________________   LABS (all labs ordered are listed, but only abnormal results are displayed)  Results for orders placed or performed during the hospital encounter of 06/20/18 (from the past 24 hour(s))  CBC with Differential/Platelet  Status: Abnormal   Collection Time: 06/20/18  9:48 PM  Result Value Ref Range   WBC 7.5 3.8 - 10.6 K/uL   RBC  3.68 (L) 4.40 - 5.90 MIL/uL   Hemoglobin 11.9 (L) 13.0 - 18.0 g/dL   HCT 34.1 (L) 40.0 - 52.0 %   MCV 92.5 80.0 - 100.0 fL   MCH 32.2 26.0 - 34.0 pg   MCHC 34.8 32.0 - 36.0 g/dL   RDW 13.9 11.5 - 14.5 %   Platelets 301 150 - 440 K/uL   Neutrophils Relative % 87 %   Neutro Abs 6.5 1.4 - 6.5 K/uL   Lymphocytes Relative 10 %   Lymphs Abs 0.7 (L) 1.0 - 3.6 K/uL   Monocytes Relative 3 %   Monocytes Absolute 0.2 0.2 - 1.0 K/uL   Eosinophils Relative 0 %   Eosinophils Absolute 0.0 0 - 0.7 K/uL   Basophils Relative 0 %   Basophils Absolute 0.0 0 - 0.1 K/uL  Comprehensive metabolic panel     Status: Abnormal   Collection Time: 06/20/18  9:48 PM  Result Value Ref Range   Sodium 132 (L) 135 - 145 mmol/L   Potassium 2.8 (L) 3.5 - 5.1 mmol/L   Chloride 100 98 - 111 mmol/L   CO2 27 22 - 32 mmol/L   Glucose, Bld 38 (LL) 70 - 99 mg/dL   BUN 13 6 - 20 mg/dL   Creatinine, Ser 0.56 (L) 0.61 - 1.24 mg/dL   Calcium 8.6 (L) 8.9 - 10.3 mg/dL   Total Protein 7.3 6.5 - 8.1 g/dL   Albumin 3.4 (L) 3.5 - 5.0 g/dL   AST 33 15 - 41 U/L   ALT 20 0 - 44 U/L   Alkaline Phosphatase 74 38 - 126 U/L   Total Bilirubin 0.3 0.3 - 1.2 mg/dL   GFR calc non Af Amer >60 >60 mL/min   GFR calc Af Amer >60 >60 mL/min   Anion gap 5 5 - 15  Glucose, capillary     Status: Abnormal   Collection Time: 06/20/18 10:00 PM  Result Value Ref Range   Glucose-Capillary 178 (H) 70 - 99 mg/dL  Glucose, capillary     Status: Abnormal   Collection Time: 06/21/18 12:40 AM  Result Value Ref Range   Glucose-Capillary 231 (H) 70 - 99 mg/dL   ____________________________________________  EKG My review and personal interpretation at Time: 21:48   Indication: ams  Rate: 60  Rhythm: sinus Axis: normal Other: normal intervals, no stemi ____________________________________________  RADIOLOGY  I personally reviewed all radiographic images ordered to evaluate for the above acute complaints and reviewed radiology reports and  findings.  These findings were personally discussed with the patient.  Please see medical record for radiology report.  ____________________________________________   PROCEDURES  Procedure(s) performed:  .Critical Care Performed by: Merlyn Lot, MD Authorized by: Merlyn Lot, MD   Critical care provider statement:    Critical care time (minutes):  30   Critical care time was exclusive of:  Separately billable procedures and treating other patients   Critical care was necessary to treat or prevent imminent or life-threatening deterioration of the following conditions:  Metabolic crisis   Critical care was time spent personally by me on the following activities:  Development of treatment plan with patient or surrogate, discussions with consultants, evaluation of patient's response to treatment, examination of patient, obtaining history from patient or surrogate, ordering and performing treatments and interventions, ordering and review of laboratory studies, ordering  and review of radiographic studies, pulse oximetry, re-evaluation of patient's condition and review of old [place] performed: yes ____________________________________________   INITIAL IMPRESSION / ASSESSMENT AND PLAN / ED COURSE  Pertinent labs & imaging results that were available during my care of the patient were reviewed by me and considered in my medical decision making (see chart for details).   DDX: Dehydration, sepsis, pna, uti, hypoglycemia, cva, drug effect, withdrawal, encephalitis   Uzziah Rigg is a 42 y.o. who presents to the ED with was as described above.  Patient found to be profoundly hyperglycemic and given IV D50 with improvement in his symptomatology.  Patient now asking for something to eat.  States he has been told many times that he allows his blood sugar to go low and states that he does frequently does not want to eat.  Denies any SI or HI.  Does have access  to food.  Clinical Course as of Jun 21 50  Wed Jun 21, 2018  0042 Patient tolerating oral hydration.  Blood sugars stabilized.  No recent insulin use and no long-acting insulin use.  As he is tolerating oral p.o. with stable blood sugar at this point do believe he stable and appropriate for outpatient follow-up.   [PR]    Clinical Course User Index [PR] Merlyn Lot, MD     As part of my medical decision making, I reviewed the following data within the [place] notes reviewed and incorporated, Labs reviewed, notes from prior ED visits.   ____________________________________________   FINAL CLINICAL IMPRESSION(S) / ED DIAGNOSES  Final diagnoses:  Transient alteration of awareness  Hypoglycemia      NEW MEDICATIONS STARTED DURING THIS VISIT:  New Prescriptions   No medications on file     Note:  This document was prepared using Dragon voice recognition software and may include unintentional dictation errors.    Merlyn Lot, MD 06/21/18 [phone]

## 2018-06-20 NOTE — ED Triage Notes (Addendum)
Pt bib family unresposive. Hx DM, family says no intake all day. Last food yesterday at 1700. Family states it is normal for pt to not eat and sleep all day every dat. CBG at arrival 25, given 1 amp D50 and pt alert and oriented within a few minutes, requesting a blanket and food because he is hungry. MD at bedside upon arrival.

## 2018-06-21 LAB — URINALYSIS, COMPLETE (UACMP) WITH MICROSCOPIC
BACTERIA UA: NONE SEEN
Bilirubin Urine: NEGATIVE
Hgb urine dipstick: NEGATIVE
Ketones, ur: NEGATIVE mg/dL
Leukocytes, UA: NEGATIVE
Nitrite: NEGATIVE
PROTEIN: NEGATIVE mg/dL
Specific Gravity, Urine: 1.003 — ABNORMAL LOW (ref 1.005–1.030)
pH: 6 (ref 5.0–8.0)

## 2018-06-21 LAB — GLUCOSE, CAPILLARY
GLUCOSE-CAPILLARY: 231 mg/dL — AB (ref 70–99)
Glucose-Capillary: 265 mg/dL — ABNORMAL HIGH (ref 70–99)

## 2018-06-21 NOTE — ED Provider Notes (Signed)
-----------------------------------------   2:41 AM on 06/21/2018 -----------------------------------------  I took over care on this patient from Dr. Quentin Cornwall.  The plan was for observation for 4 hours to rule out recurrent hypoglycemia.  The patient's blood glucose has remained stable in the 200s.  He is comfortable appearing and alert.  He is stable for discharge home at this time.  I discussed the results of the work-up with him.  Return precautions given, and he expresses understanding.   Arta Silence, MD 06/21/18 [phone]

## 2018-06-21 NOTE — ED Notes (Signed)
Patient is resting comfortably at this time with no signs of distress present. Equal, unlabored rise and fall of chest noted within normal rate. Still unable to reach family to pick him up. Will continue to monitor.

## 2018-06-21 NOTE — ED Notes (Signed)
Pt inquiring about the breakfast menu so that he can order breakfast to his room. I explained again to the patient that he is not admitted, he has been discharged for hours and that we are waiting for his family to come pick him up, but that he is discharged and he's welcome to go to the cafeteria when it's open.

## 2018-06-21 NOTE — ED Notes (Signed)
Pt given crackers, pb, juice per request

## 2018-06-21 NOTE — ED Notes (Signed)
Pt resting in NAD at this time, displaying equal and unlabored respirations. Pt family not answering my calls to retrieve their family member.pt deny needs at this time. Will continue to monitor.

## 2019-01-03 ENCOUNTER — Inpatient Hospital Stay
Admission: EM | Admit: 2019-01-03 | Discharge: 2019-01-04 | DRG: 684 | Disposition: A | Discharge status: Home / Self Care | Payer: Self-pay | Attending: Internal Medicine | Admitting: Internal Medicine

## 2019-01-03 ENCOUNTER — Other Ambulatory Visit: Payer: Self-pay

## 2019-01-03 ENCOUNTER — Encounter: Payer: Self-pay | Admitting: Emergency Medicine

## 2019-01-03 ENCOUNTER — Emergency Department: Payer: Self-pay

## 2019-01-03 DIAGNOSIS — R112 Nausea with vomiting, unspecified: Secondary | ICD-10-CM

## 2019-01-03 DIAGNOSIS — D649 Anemia, unspecified: Secondary | ICD-10-CM | POA: Diagnosis present

## 2019-01-03 DIAGNOSIS — K3184 Gastroparesis: Secondary | ICD-10-CM | POA: Diagnosis present

## 2019-01-03 DIAGNOSIS — Z833 Family history of diabetes mellitus: Secondary | ICD-10-CM

## 2019-01-03 DIAGNOSIS — K529 Noninfective gastroenteritis and colitis, unspecified: Secondary | ICD-10-CM | POA: Diagnosis present

## 2019-01-03 DIAGNOSIS — K219 Gastro-esophageal reflux disease without esophagitis: Secondary | ICD-10-CM | POA: Diagnosis present

## 2019-01-03 DIAGNOSIS — E1165 Type 2 diabetes mellitus with hyperglycemia: Secondary | ICD-10-CM | POA: Diagnosis present

## 2019-01-03 DIAGNOSIS — N179 Acute kidney failure, unspecified: Principal | ICD-10-CM

## 2019-01-03 DIAGNOSIS — E1143 Type 2 diabetes mellitus with diabetic autonomic (poly)neuropathy: Secondary | ICD-10-CM | POA: Diagnosis present

## 2019-01-03 DIAGNOSIS — I959 Hypotension, unspecified: Secondary | ICD-10-CM

## 2019-01-03 DIAGNOSIS — R739 Hyperglycemia, unspecified: Secondary | ICD-10-CM

## 2019-01-03 DIAGNOSIS — Z8611 Personal history of tuberculosis: Secondary | ICD-10-CM

## 2019-01-03 LAB — CBC
HEMATOCRIT: 32.9 % — AB (ref 39.0–52.0)
Hemoglobin: 11 g/dL — ABNORMAL LOW (ref 13.0–17.0)
MCH: 30.7 pg (ref 26.0–34.0)
MCHC: 33.4 g/dL (ref 30.0–36.0)
MCV: 91.9 fL (ref 80.0–100.0)
NRBC: 0 % (ref 0.0–0.2)
Platelets: 300 10*3/uL (ref 150–400)
RBC: 3.58 MIL/uL — ABNORMAL LOW (ref 4.22–5.81)
RDW: 12.7 % (ref 11.5–15.5)
WBC: 11.8 10*3/uL — AB (ref 4.0–10.5)

## 2019-01-03 LAB — URINALYSIS, COMPLETE (UACMP) WITH MICROSCOPIC
BILIRUBIN URINE: NEGATIVE
Bacteria, UA: NONE SEEN
Glucose, UA: >=500 mg/dL — AB
Hgb urine dipstick: NEGATIVE
KETONES UR: 5 mg/dL — AB
Leukocytes,Ua: NEGATIVE
Nitrite: NEGATIVE
Protein, ur: 30 mg/dL — AB
Specific Gravity, Urine: 1.02 (ref 1.005–1.030)
pH: 5 (ref 5.0–8.0)

## 2019-01-03 LAB — GLUCOSE, CAPILLARY
GLUCOSE-CAPILLARY: 376 mg/dL — AB (ref 70–99)
Glucose-Capillary: 279 mg/dL — ABNORMAL HIGH (ref 70–99)

## 2019-01-03 LAB — COMPREHENSIVE METABOLIC PANEL
ALT: 18 U/L (ref 0–44)
AST: 12 U/L — AB (ref 15–41)
Albumin: 3.9 g/dL (ref 3.5–5.0)
Alkaline Phosphatase: 134 U/L — ABNORMAL HIGH (ref 38–126)
Anion gap: 13 (ref 5–15)
BUN: 49 mg/dL — ABNORMAL HIGH (ref 6–20)
CHLORIDE: 103 mmol/L (ref 98–111)
CO2: 19 mmol/L — ABNORMAL LOW (ref 22–32)
Calcium: 9.1 mg/dL (ref 8.9–10.3)
Creatinine, Ser: 2.3 mg/dL — ABNORMAL HIGH (ref 0.61–1.24)
GFR calc Af Amer: 39 mL/min — ABNORMAL LOW (ref >60)
GFR calc non Af Amer: 34 mL/min — ABNORMAL LOW (ref >60)
Glucose, Bld: 416 mg/dL — ABNORMAL HIGH (ref 70–99)
Potassium: 4.5 mmol/L (ref 3.5–5.1)
Sodium: 135 mmol/L (ref 135–145)
Total Bilirubin: 0.7 mg/dL (ref 0.3–1.2)
Total Protein: 8.6 g/dL — ABNORMAL HIGH (ref 6.5–8.1)

## 2019-01-03 LAB — BRAIN NATRIURETIC PEPTIDE: B Natriuretic Peptide: 20 pg/mL (ref 0.0–100.0)

## 2019-01-03 LAB — URINE DRUG SCREEN, QUALITATIVE (ARMC ONLY)
Amphetamines, Ur Screen: NOT DETECTED
Barbiturates, Ur Screen: NOT DETECTED
Benzodiazepine, Ur Scrn: NOT DETECTED
Cannabinoid 50 Ng, Ur ~~LOC~~: NOT DETECTED
Cocaine Metabolite,Ur ~~LOC~~: NOT DETECTED
MDMA (Ecstasy)Ur Screen: NOT DETECTED
METHADONE SCREEN, URINE: NOT DETECTED
OPIATE, UR SCREEN: NOT DETECTED
Phencyclidine (PCP) Ur S: NOT DETECTED
Tricyclic, Ur Screen: NOT DETECTED

## 2019-01-03 LAB — TROPONIN I: Troponin I: <0.03 ng/mL (ref <0.03)

## 2019-01-03 LAB — LIPASE, BLOOD: Lipase: 33 U/L (ref 11–51)

## 2019-01-03 IMAGING — CR CHEST - 2 VIEW
2 series · 2 of 2 positions shown · non-contrast
Comparison: [DATE]

CLINICAL DATA: Vomiting and diarrhea since yesterday, feels like
his blood sugar is low, single episode of vomiting and 2-3 episodes
of diarrhea today, hypotension, history tuberculosis, diabetes
mellitus, GERD

EXAM:
CHEST - 2 VIEW

[chest pa]
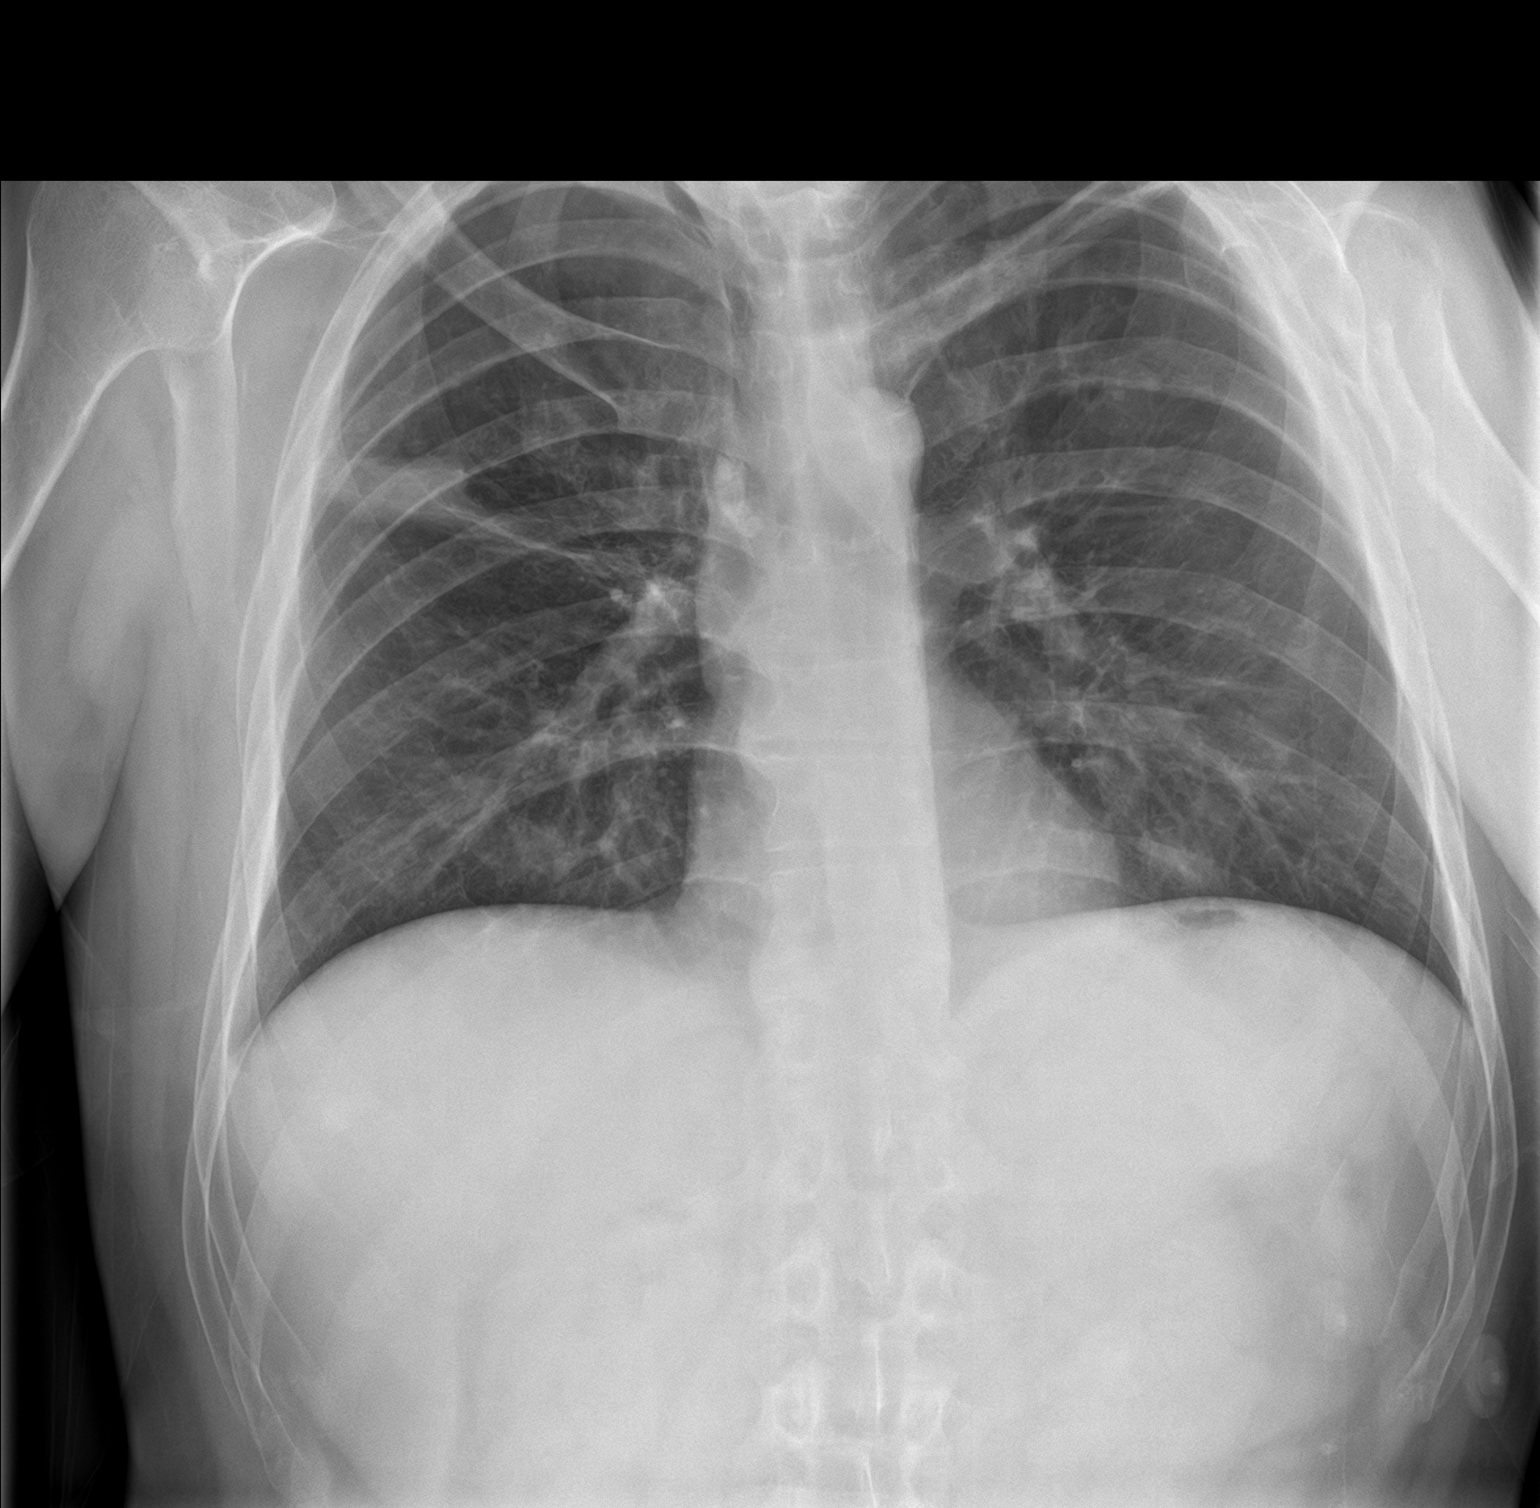

[chest lat]
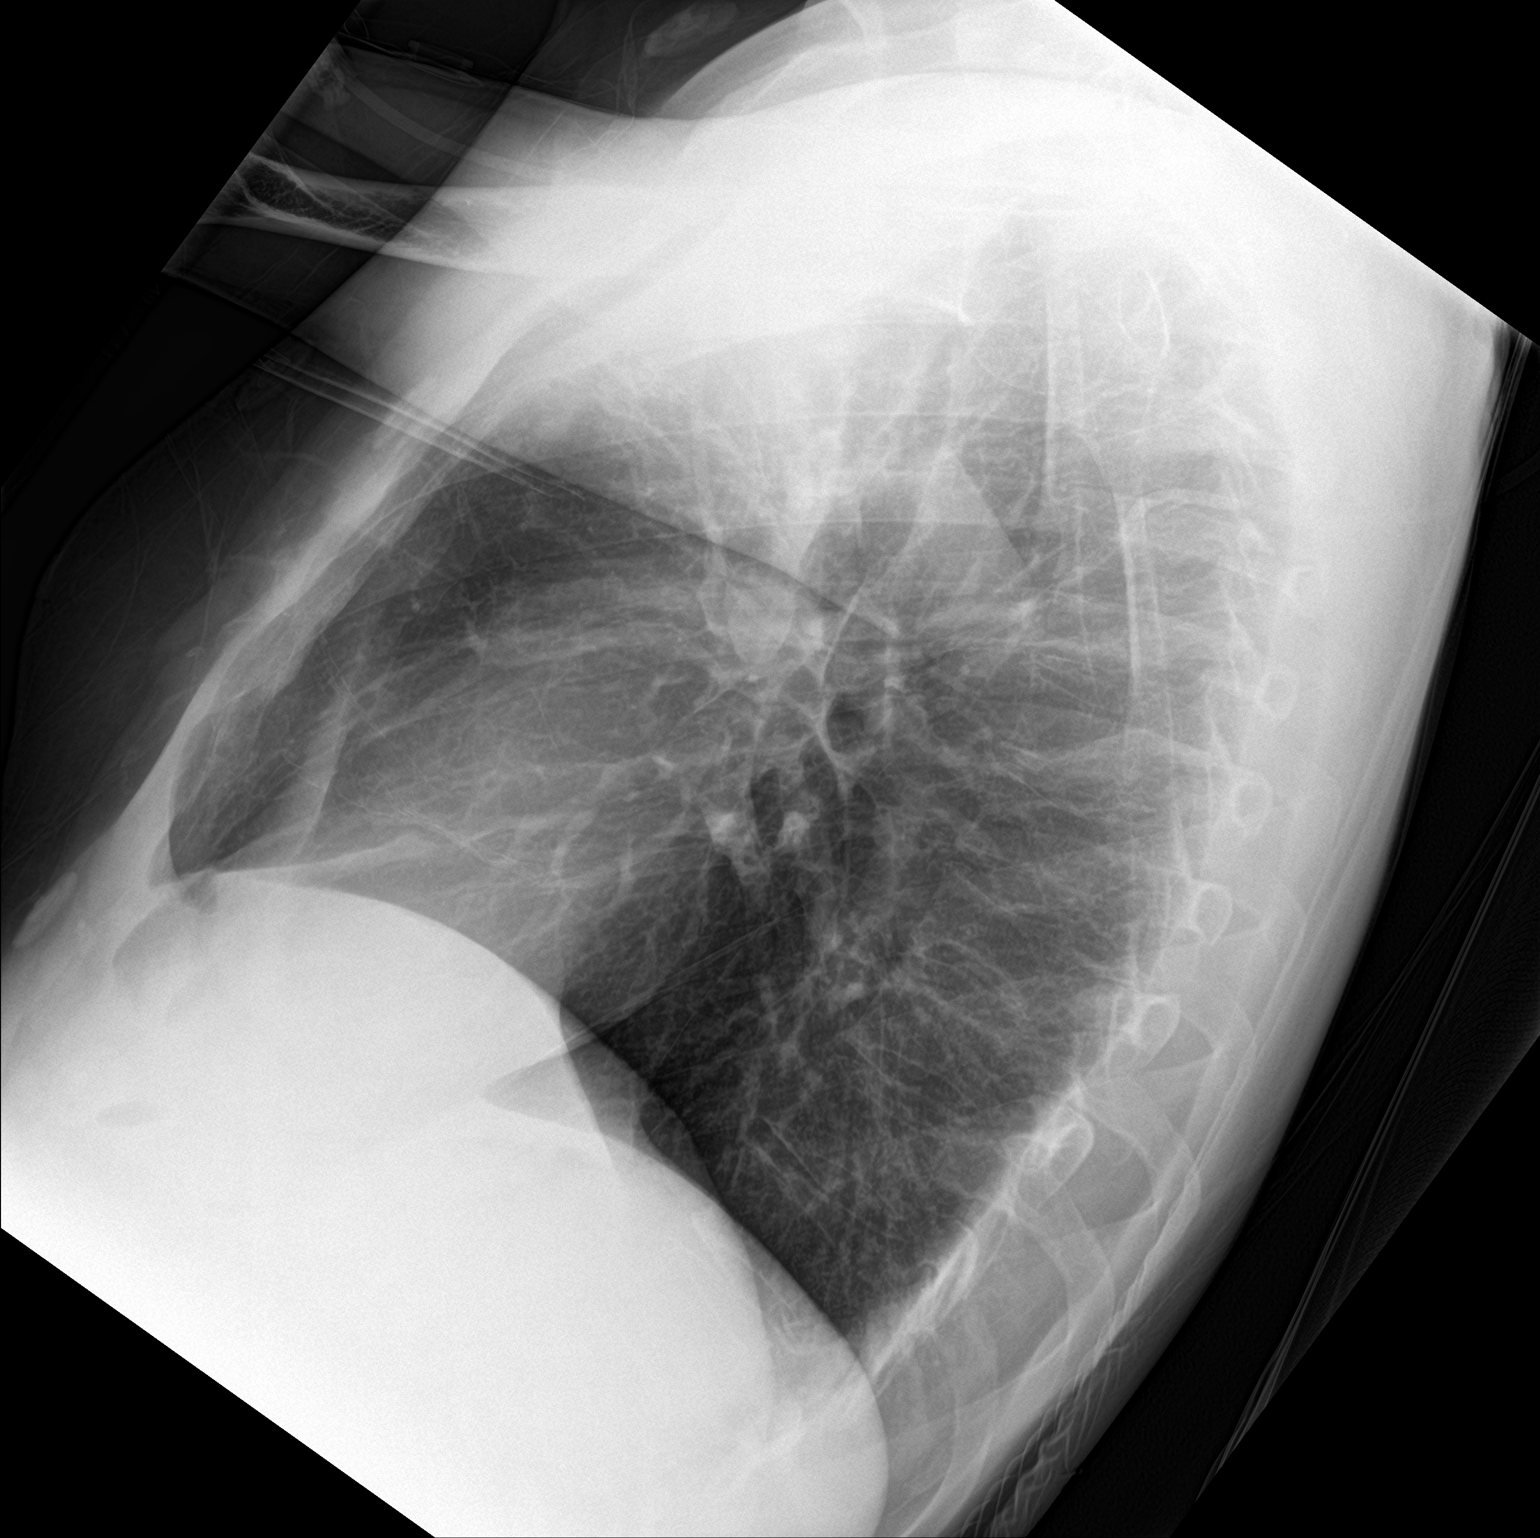

[2 of 2 positions shown; findings below may reference images not displayed]

FINDINGS: Normal heart size, mediastinal contours, and pulmonary vascularity.

Chronic opacity in the RIGHT upper lobe adjacent to the minor
fissure, likely scarring, unchanged since at least [DATE].

Slight chronic accentuation of interstitial markings, stable.

No acute infiltrate, pleural effusion or pneumothorax.

Bones unremarkable.
IMPRESSION: RIGHT upper lobe scarring and chronic accentuation of markings.

No acute abnormalities.

## 2019-01-03 MED ORDER — ALBUTEROL SULFATE (2.5 MG/3ML) 0.083% IN NEBU: 2.5000 mg | INHALATION_SOLUTION | Freq: Four times a day (QID) | RESPIRATORY_TRACT | Status: DC | PRN

## 2019-01-03 MED ORDER — ACETAMINOPHEN 325 MG PO TABS: 650.0000 mg | ORAL_TABLET | Freq: Four times a day (QID) | ORAL | Status: DC | PRN

## 2019-01-03 MED ORDER — INSULIN ASPART 100 UNIT/ML ~~LOC~~ SOLN
0.0000 [IU] | Freq: Three times a day (TID) | SUBCUTANEOUS | Status: DC
  Administered 2019-01-04 (×2): 7 [IU] via SUBCUTANEOUS
  Filled 2019-01-03 (×2): qty 1

## 2019-01-03 MED ORDER — ACETAMINOPHEN 650 MG RE SUPP: 650.0000 mg | Freq: Four times a day (QID) | RECTAL | Status: DC | PRN

## 2019-01-03 MED ORDER — HEPARIN SODIUM (PORCINE) 5000 UNIT/ML IJ SOLN
5000.0000 [IU] | Freq: Three times a day (TID) | INTRAMUSCULAR | Status: DC
  Administered 2019-01-03 – 2019-01-04 (×2): 5000 [IU] via SUBCUTANEOUS
  Filled 2019-01-03 (×2): qty 1

## 2019-01-03 MED ORDER — ADULT MULTIVITAMIN W/MINERALS CH
1.0000 | ORAL_TABLET | Freq: Every day | ORAL | Status: DC
  Administered 2019-01-04: 1 via ORAL
  Filled 2019-01-03: qty 1

## 2019-01-03 MED ORDER — ONDANSETRON HCL 4 MG/2ML IJ SOLN
4.0000 mg | Freq: Four times a day (QID) | INTRAMUSCULAR | Status: DC | PRN
  Administered 2019-01-03: 4 mg via INTRAVENOUS
  Filled 2019-01-03: qty 2

## 2019-01-03 MED ORDER — INSULIN ASPART 100 UNIT/ML ~~LOC~~ SOLN
10.0000 [IU] | Freq: Once | SUBCUTANEOUS | Status: AC
  Administered 2019-01-03: 10 [IU] via INTRAVENOUS
  Filled 2019-01-03: qty 1

## 2019-01-03 MED ORDER — SODIUM CHLORIDE 0.9 % IV BOLUS
1000.0000 mL | Freq: Once | INTRAVENOUS | Status: AC
  Administered 2019-01-03: 1000 mL via INTRAVENOUS

## 2019-01-03 MED ORDER — FLORANEX PO PACK
1.0000 g | PACK | Freq: Three times a day (TID) | ORAL | Status: DC
  Administered 2019-01-04 (×2): 1 g via ORAL
  Filled 2019-01-03 (×3): qty 1

## 2019-01-03 MED ORDER — SODIUM CHLORIDE 0.9 % IV SOLN
INTRAVENOUS | Status: DC
  Administered 2019-01-03 – 2019-01-04 (×2): via INTRAVENOUS

## 2019-01-03 MED ORDER — INSULIN ASPART PROT & ASPART (70-30 MIX) 100 UNIT/ML ~~LOC~~ SUSP: 55.0000 [IU] | Freq: Two times a day (BID) | SUBCUTANEOUS | Status: DC

## 2019-01-03 MED ORDER — ONDANSETRON HCL 4 MG PO TABS: 4.0000 mg | ORAL_TABLET | Freq: Four times a day (QID) | ORAL | Status: DC | PRN

## 2019-01-03 MED ORDER — ALBUTEROL SULFATE (2.5 MG/3ML) 0.083% IN NEBU: 2.5000 mg | INHALATION_SOLUTION | Freq: Four times a day (QID) | RESPIRATORY_TRACT | Status: DC

## 2019-01-03 MED ORDER — INSULIN ASPART 100 UNIT/ML ~~LOC~~ SOLN
0.0000 [IU] | Freq: Every day | SUBCUTANEOUS | Status: DC
  Administered 2019-01-03: 3 [IU] via SUBCUTANEOUS
  Filled 2019-01-03: qty 1

## 2019-01-03 NOTE — H&P (Signed)
[facility] NAME: Gregory Moody    MR#:  [id number]  DATE OF BIRTH:  [birth year]  DATE OF ADMISSION:  01/03/2019  PRIMARY CARE PHYSICIAN: [facility]   REQUESTING/REFERRING PHYSICIAN:   CHIEF COMPLAINT:   Chief Complaint  Patient presents with  . Emesis  . Diarrhea    HISTORY OF PRESENT ILLNESS: Gregory Moody  is a 43 y.o. male with a known history that includes diabetes mellitus type 2 with gastroparesis, chronic hypotension, treated tuberculosis-chronic diarrhea since treatment, presented emergency room with 2-day history of nausea, emesis, diarrhea, poor p.o. intake, generalized weakness, fatigue, felt that his blood sugar was low, lightheaded, in the emergency room patient was noted to have blood pressure systolically in the 64Q, creatinine 2.3 with baseline normal, glucose 416 with anion gap normal, white count 11,000, chest x-ray negative, urinalysis pending, blood pressure did improve with IV fluids for rehydration, patient evaluated in the emergency room, no apparent distress, resting comfortably in bed, patient stated that he was detained in an immigration similar in [place] in February and was noted to have low blood pressure at that time, was supposed to undergo cardiac evaluation-has yet to be done, patient denies any pain, fevers, chills, night sweats, denies sick contacts, patient is now been admitted for acute kidney injury most likely secondary to poor p.o. intake from nausea/emesis/acute on chronic diarrhea with associated hypotension, acute hyperglycemia.  PAST MEDICAL HISTORY:   Past Medical History:  Diagnosis Date  . Diabetes mellitus without complication ([place])   . Gastroparesis   . Tuberculosis     PAST SURGICAL HISTORY:  Past Surgical History:  Procedure Laterality Date  . NO PAST SURGERIES      SOCIAL HISTORY:  Social History   Tobacco Use  . Smoking status: Never Smoker   . Smokeless tobacco: Never Used  Substance Use Topics  . Alcohol use: No    FAMILY HISTORY:  Family History  Problem Relation Age of Onset  . Diabetes Mother   . Diabetes Father     DRUG ALLERGIES: No Known Allergies  REVIEW OF SYSTEMS:   CONSTITUTIONAL: No fever,+ fatigue, weakness.  EYES: No blurred or double vision.  EARS, NOSE, AND THROAT: No tinnitus or ear pain.  RESPIRATORY: No cough, shortness of breath, wheezing or hemoptysis.  CARDIOVASCULAR: No chest pain, orthopnea, edema.  GASTROINTESTINAL: +nausea, vomiting, diarrhea  GENITOURINARY: No dysuria, hematuria.  ENDOCRINE: No polyuria, nocturia,  HEMATOLOGY: No anemia, easy bruising or bleeding SKIN: No rash or lesion. MUSCULOSKELETAL: No joint pain or arthritis.   NEUROLOGIC: No tingling, numbness, weakness.  PSYCHIATRY: No anxiety or depression.   MEDICATIONS AT HOME:  Prior to Admission medications   Medication Sig Start Date End Date Taking? Authorizing Provider  insulin NPH-regular Human (NOVOLIN 70/30) (70-30) 100 UNIT/ML injection Inject 55 Units into the skin 2 (two) times daily before a meal. 02/24/18 03/27/18  Bettey Costa, MD      PHYSICAL EXAMINATION:   VITAL SIGNS: Blood pressure 117/72, pulse 88, temperature 98.3 F (36.8 C), temperature source Oral, resp. rate 12, height 5\' 8"  (1.727 m), weight 68 kg, SpO2 99 %.  GENERAL:  43 y.o.-year-old patient lying in the bed with no acute distress.  EYES: Pupils equal, round, reactive to light and accommodation. No scleral icterus. Extraocular muscles intact.  HEENT: Head atraumatic, normocephalic. Oropharynx and nasopharynx clear.  Dry mucous membranes NECK:  Supple, no jugular venous distention. No thyroid enlargement, no tenderness.  Poor  skin turgor LUNGS: Normal breath sounds bilaterally, no wheezing, rales,rhonchi or crepitation. No use of accessory muscles of respiration.  CARDIOVASCULAR: S1, S2 normal. No murmurs, rubs, or gallops.  ABDOMEN: Soft,  nontender, nondistended. Bowel sounds present. No organomegaly or mass.  EXTREMITIES: No pedal edema, cyanosis, or clubbing.  NEUROLOGIC: Cranial nerves II through XII are intact. MAES Gait not checked.  PSYCHIATRIC: The patient is alert and oriented x 3.  SKIN: No obvious rash, lesion, or ulcer.   LABORATORY PANEL:   CBC Recent Labs  Lab 01/03/19 1626  WBC 11.8*  HGB 11.0*  HCT 32.9*  PLT 300  MCV 91.9  MCH 30.7  MCHC 33.4  RDW 12.7   ------------------------------------------------------------------------------------------------------------------  Chemistries  Recent Labs  Lab 01/03/19 1626  NA 135  K 4.5  CL 103  CO2 19*  GLUCOSE 416*  BUN 49*  CREATININE 2.30*  CALCIUM 9.1  AST 12*  ALT 18  ALKPHOS 134*  BILITOT 0.7   ------------------------------------------------------------------------------------------------------------------ estimated creatinine clearance is 39.8 mL/min (A) (by C-G formula based on SCr of 2.3 mg/dL (H)). ------------------------------------------------------------------------------------------------------------------ No results for input(s): TSH, T4TOTAL, T3FREE, THYROIDAB in the last 72 hours.  Invalid input(s): FREET3   Coagulation profile No results for input(s): INR, PROTIME in the last 168 hours. ------------------------------------------------------------------------------------------------------------------- No results for input(s): DDIMER in the last 72 hours. -------------------------------------------------------------------------------------------------------------------  Cardiac Enzymes Recent Labs  Lab 01/03/19 1626  TROPONINI <0.03   ------------------------------------------------------------------------------------------------------------------ Invalid input(s): POCBNP  ---------------------------------------------------------------------------------------------------------------  Urinalysis    Component  Value Date/Time   COLORURINE YELLOW (A) 01/03/2019 1626   APPEARANCEUR HAZY (A) 01/03/2019 1626   LABSPEC 1.020 01/03/2019 1626   PHURINE 5.0 01/03/2019 1626   GLUCOSEU >=500 (A) 01/03/2019 1626   HGBUR NEGATIVE 01/03/2019 1626   BILIRUBINUR NEGATIVE 01/03/2019 1626   KETONESUR 5 (A) 01/03/2019 1626   PROTEINUR 30 (A) 01/03/2019 1626   NITRITE NEGATIVE 01/03/2019 1626   LEUKOCYTESUR NEGATIVE 01/03/2019 1626     RADIOLOGY: Dg Chest 2 View  Result Date: 01/03/2019 CLINICAL DATA:  Vomiting and diarrhea since yesterday, feels like his blood sugar is low, single episode of vomiting and 2-3 episodes of diarrhea today, hypotension, history tuberculosis, diabetes mellitus, GERD EXAM: CHEST - 2 VIEW COMPARISON:  06/20/2018 FINDINGS: Normal heart size, mediastinal contours, and pulmonary vascularity. Chronic opacity in the RIGHT upper lobe adjacent to the minor fissure, likely scarring, unchanged since at least 12/21/2016. Slight chronic accentuation of interstitial markings, stable. No acute infiltrate, pleural effusion or pneumothorax. Bones unremarkable. IMPRESSION: RIGHT upper lobe scarring and chronic accentuation of markings. No acute abnormalities. Electronically Signed   By: Lavonia Dana M.D.   On: 01/03/2019 17:33    EKG: Orders placed or performed during the hospital encounter of 06/20/18  . ED EKG  . ED EKG  . EKG 12-Lead  . EKG 12-Lead  . EKG 12-Lead  . EKG 12-Lead    IMPRESSION AND PLAN: *Acute kidney injury Most likely secondary to acute nausea/vomiting acute on chronic diarrhea, poor p.o. intake Admit to regular nursing for bed, IV fluids for rehydration, strict I&O monitoring, daily weights, BMP in the morning, check renal ultrasound, avoid nephrotoxic agents, consider nephrology consultation if no improvement  *Acute hypotension Most likely secondary to nausea/vomiting, acute on chronic diarrhea Resolved with IV fluids for rehydration, fall precautions  *Acute  nausea/emesis with acute on chronic diarrhea Case confounded by history of diabetes with gastroparesis, noted acute hyperglycemia without DKA Antiemetics PRN, Lactinex 3 times daily, strict I&O monitoring, follow-up on urinalysis, urine  drug screen  *Chronic diarrhea Patient states that he has had chronic diarrhea since treatment for his tuberculosis Lactinex 3 times daily, consider Imodium  *Acute hyperglycemia chronic diabetes mellitus type 2 with history of gastroparesis IV fluids for rehydration, continue insulin 70/30 twice daily, sliding scale insulin with Accu-Cheks per routine, check hemoglobin A1c determine level control, diabetic educator while in house   All the records are reviewed and case discussed with ED provider. Management plans discussed with the patient, family and they are in agreement.  CODE STATUS:full Code Status History    Date Active Date Inactive Code Status Order ID Comments User Context   02/23/2018 0126 02/24/2018 1539 Full Code [id number]  Lance Coon, MD ED   04/22/2017 0004 04/22/2017 2213 Full Code [id number]  Lance Coon, MD Inpatient   04/17/2017 2234 04/19/2017 2005 Full Code [id number]  [name], MD Inpatient   04/06/2016 1907 04/10/2016 1928 Full Code [id number]  Loletha Grayer, MD ED       TOTAL TIME TAKING CARE OF THIS PATIENT: 40 minutes.    Gregory Moody M.D on 01/03/2019   Between 7am to 6pm - Pager - [phone]  After 6pm go to [email or web address] - password [email or web address] [facility]  Office  [phone]  CC: Primary care physician; [place]   Note: This dictation was prepared with Dragon dictation along with smaller phrase technology. Any transcriptional errors that result from this process are unintentional.

## 2019-01-03 NOTE — ED Notes (Signed)
Pt aware of UA sample. This RN provided pt with a urinal. Pt is unable to provide UA at this time.

## 2019-01-03 NOTE — Progress Notes (Signed)
Family Meeting Note  Advance Directive:yes  Today a meeting took place with the Patient.  Patient is able to participate   The following clinical team members were present during this meeting:MD  The following were discussed:Patient's diagnosis: Chronic diabetes mellitus type 2 with history of gastroparesis, history of tuberculosis with chronic diarrhea since treatment, presenting with nausea/vomiting acute on chronic diarrhea, dizziness, noted severe hypotension, acute renal failure, hyperglycemia in the emergency room, Patient's progosis: Unable to determine and Goals for treatment: Full Code  Additional follow-up to be provided: prn  Time spent during discussion:20 minutes  Gorden Harms, MD

## 2019-01-03 NOTE — ED Notes (Signed)
ED Provider at bedside. 

## 2019-01-03 NOTE — ED Notes (Signed)
MD Salary at bedside

## 2019-01-03 NOTE — ED Notes (Signed)
Pt denies pain, N/V/D, dizziness, at this time. Pt is A/Ox4. NAD noted at this time.

## 2019-01-03 NOTE — ED Notes (Signed)
Patient transported to X-ray 

## 2019-01-03 NOTE — ED Notes (Signed)
Patient assisted to stand in attempt to urinate. Patient unable to urinate. Will try again once patient receives more fluids

## 2019-01-03 NOTE — ED Provider Notes (Signed)
[facility] Emergency Department Provider Note  ____________________________________________  Time seen: Approximately 4:52 PM  I have reviewed the triage vital signs and the nursing notes.   HISTORY  Chief Complaint Emesis and Diarrhea    HPI Gregory Moody is a 43 y.o. male history of diabetes and diabetic gastroparesis, chronic daily diarrhea, malnutrition, history of tuberculosis reportedly requiring partial lung resection, presenting with nausea and vomiting.  The patient reports that for the past 2 days he has been unable to tolerate anything by mouth.  He has had no abdominal pain and his chronic diarrhea has been unchanged.  He has had no fevers or chills, dysuria, chest pain, shortness of breath or cough.  He is markedly hypotensive with a blood pressure of 64/52 in triage and states that recently in the month of February he was detained in an immigration center in [place] and was having consistent hypotension.  He was supposed to have a work-up on his heart was never able to accomplish this.  Past Medical History:  Diagnosis Date  . Diabetes mellitus without complication ([place])   . Gastroparesis   . Tuberculosis     Patient Active Problem List   Diagnosis Date Noted  . Malnutrition of moderate degree 02/24/2018  . GERD (gastroesophageal reflux disease) 02/23/2018  . Diabetic gastroparesis ([place]) 02/23/2018  . Diabetic foot ulcer ([place]) 02/23/2018  . Aspiration pneumonia ([place]) 04/21/2017  . Hypoglycemia 04/21/2017  . Diabetes mellitus with hyperglycemia ([place]) 04/21/2017  . Hip fracture, unspecified laterality, closed, initial encounter ([place]) 04/17/2017  . Hyperglycemia 04/17/2017  . Protein-calorie malnutrition, severe 04/09/2016  . Cavitary lesion of lung 04/06/2016    Past Surgical History:  Procedure Laterality Date  . NO PAST SURGERIES      Current Outpatient Rx  . Order #: [id number] Class: Print    Allergies Patient has no  known allergies.  Family History  Problem Relation Age of Onset  . Diabetes Mother   . Diabetes Father     Social History Social History   Tobacco Use  . Smoking status: Never Smoker  . Smokeless tobacco: Never Used  Substance Use Topics  . Alcohol use: No  . Drug use: No    Review of Systems Constitutional: No fever/chills.  Positive lightheadedness without syncope.  Positive generalized malaise and fatigue. Eyes: No visual changes. ENT: No sore throat. No congestion or rhinorrhea. Cardiovascular: Denies chest pain. Denies palpitations.  Positive hypotension. Respiratory: Denies shortness of breath.  No cough. Gastrointestinal: No abdominal pain.  Positive nausea, positive vomiting.  Monic unchanged diarrhea.  No constipation. Genitourinary: Negative for dysuria. Musculoskeletal: Negative for back pain. Skin: Negative for rash. Neurological: Negative for headaches. No focal numbness, tingling or weakness.     ____________________________________________   PHYSICAL EXAM:  VITAL SIGNS: ED Triage Vitals  Enc Vitals Group     BP 01/03/19 1614 (!) 72/55     Pulse Rate 01/03/19 1614 93     Resp 01/03/19 1614 16     Temp 01/03/19 1614 98.3 F (36.8 C)     Temp Source 01/03/19 1614 Oral     SpO2 01/03/19 1614 99 %     Weight 01/03/19 1613 150 lb (68 kg)     Height 01/03/19 1613 5\' 8"  (1.727 m)     Head Circumference --      Peak Flow --      Pain Score 01/03/19 1613 0     Pain Loc --      Pain Edu? --  Excl. in [place]? --     Constitutional: Alert and oriented.  Answers questions appropriately.  Chronically ill-appearing.  GCS is 15. Eyes: Conjunctivae are normal.  EOMI. No scleral icterus. Head: Atraumatic. Nose: No congestion/rhinnorhea. Mouth/Throat: Mucous membranes are mildly dry.  Neck: No stridor.  Supple.  No meningismus. Cardiovascular: Normal rate, regular rhythm. No murmurs, rubs or gallops.  Respiratory: Normal respiratory effort.  No accessory  muscle use or retractions. Lungs CTAB.  No wheezes, rales or ronchi. Gastrointestinal: Soft, and mildly distended.  No tenderness to palpation on my examination..  No guarding or rebound.  No peritoneal signs. Musculoskeletal: No LE edema. No ttp in the calves or palpable cords.  Negative Homan's sign. Neurologic:  A&Ox3.  Speech is clear.  Face and smile are symmetric.  EOMI.  Moves all extremities well. Skin:  Skin is warm, dry and intact. No rash noted. Psychiatric: Mood and affect are normal.   ____________________________________________   LABS (all labs ordered are listed, but only abnormal results are displayed)  Labs Reviewed  GLUCOSE, CAPILLARY - Abnormal; Notable for the following components:      Result Value   Glucose-Capillary 376 (*)    All other components within normal limits  CBC - Abnormal; Notable for the following components:   WBC 11.8 (*)    RBC 3.58 (*)    Hemoglobin 11.0 (*)    HCT 32.9 (*)    All other components within normal limits  COMPREHENSIVE METABOLIC PANEL - Abnormal; Notable for the following components:   CO2 19 (*)    Glucose, Bld 416 (*)    BUN 49 (*)    Creatinine, Ser 2.30 (*)    Total Protein 8.6 (*)    AST 12 (*)    Alkaline Phosphatase 134 (*)    GFR calc non Af Amer 34 (*)    GFR calc Af Amer 39 (*)    All other components within normal limits  LIPASE, BLOOD  TROPONIN I  BRAIN NATRIURETIC PEPTIDE  URINALYSIS, COMPLETE (UACMP) WITH MICROSCOPIC   ____________________________________________  EKG  ED ECG REPORT I, Anne-Caroline Mariea Clonts, the attending physician, personally viewed and interpreted this ECG.   Date: 01/03/2019  EKG Time: 1623  Rate: 80  Rhythm: normal sinus rhythm  Axis: normal  Intervals:none  ST&T Change: No STEMI  ____________________________________________  RADIOLOGY  Dg Chest 2 View  Result Date: 01/03/2019 CLINICAL DATA:  Vomiting and diarrhea since yesterday, feels like his blood sugar is low,  single episode of vomiting and 2-3 episodes of diarrhea today, hypotension, history tuberculosis, diabetes mellitus, GERD EXAM: CHEST - 2 VIEW COMPARISON:  06/20/2018 FINDINGS: Normal heart size, mediastinal contours, and pulmonary vascularity. Chronic opacity in the RIGHT upper lobe adjacent to the minor fissure, likely scarring, unchanged since at least 12/21/2016. Slight chronic accentuation of interstitial markings, stable. No acute infiltrate, pleural effusion or pneumothorax. Bones unremarkable. IMPRESSION: RIGHT upper lobe scarring and chronic accentuation of markings. No acute abnormalities. Electronically Signed   By: Lavonia Dana M.D.   On: 01/03/2019 17:33    ____________________________________________   PROCEDURES  Procedure(s) performed: None  Procedures  Critical Care performed: Yes ____________________________________________   INITIAL IMPRESSION / ASSESSMENT AND PLAN / ED COURSE  Pertinent labs & imaging results that were available during my care of the patient were reviewed by me and considered in my medical decision making (see chart for details).  43 y.o. male with chronic illness including diabetes and diabetic gastroparesis presenting with inability to keep down any  food or drink for 2 days.,  Associated with hypotension.  Overall, the patient appears chronically ill.  It is possible that his vomiting is due to his gastroparesis.  I have asked him about marijuana use, which he denies.  He may also have a viral or foodborne GI illness.  He denies any sick contacts.  His abdomen is soft and benign.  It is possible that he has a baseline hypotension and that his vomiting worsened his hypotension due to hypovolemia.  I would also consider an endocrinopathy, including something like Addison's disease.  We will get a cardiac work-up, as well as electrolytes and basic blood work.  The patient will be treated with intravenous fluids.  The patient will require admission for continued  evaluation and treatment.  5:53 PM The patient's work-up is consistent with hyperglycemia without DKA, and acute renal failure with a creatinine of 2.30.  He has received 2 L of intravenous fluids, and will receive insulin.  His lipase is normal and his other electrolytes are reassuring.  His white blood cell count is 11.8, which is nonspecific.  He has a chronic anemia which is unchanged.  The patient's chest x-ray does not show any acute abnormality; he has some chronic right upper lobe scarring.  The patient's blood pressure has normalized after intravenous fluids and he is feeling better.  At this time, I will plan to admit the patient for ongoing evaluation and treatment.  CRITICAL CARE Performed by: Eula Listen   Total critical care time: 35 minutes  Critical care time was exclusive of separately billable procedures and treating other patients.  Critical care was necessary to treat or prevent imminent or life-threatening deterioration.  Critical care was time spent personally by me on the following activities: development of treatment plan with patient and/or surrogate as well as nursing, discussions with consultants, evaluation of patient's response to treatment, examination of patient, obtaining history from patient or surrogate, ordering and performing treatments and interventions, ordering and review of laboratory studies, ordering and review of radiographic studies, pulse oximetry and re-evaluation of patient's condition.   ____________________________________________  FINAL CLINICAL IMPRESSION(S) / ED DIAGNOSES  Final diagnoses:  Acute renal failure, unspecified acute renal failure type (HCC)  Hypotension, unspecified hypotension type  Hyperglycemia  Intractable nausea and vomiting         NEW MEDICATIONS STARTED DURING THIS VISIT:  New Prescriptions   No medications on file      Eula Listen, MD 01/03/19 1754

## 2019-01-03 NOTE — ED Notes (Signed)
ED TO INPATIENT HANDOFF REPORT  ED Nurse Name and Phone #: [phone]  S Name/Age/Gender Gregory Moody 43 y.o. male Room/Bed: ED15A/ED15A  Code Status   Code Status: Prior  Home/SNF/Other Home Patient oriented to: self, place, time and situation Is this baseline? Yes   Triage Complete: Triage complete  Chief Complaint VD/Low Sugar  Triage Note Pt reports vomiting and diarrhea since yesterday.  Also feels like his blood sugar is low. Vomit X 1 today. Diarrhea 2-3 X today. Hypotensive.     Allergies No Known Allergies  Level of Care/Admitting Diagnosis ED Disposition    ED Disposition Condition [facility]: [place] [[id number]]  Level of Care: Med-Surg [16]  Diagnosis: ARF (acute renal failure) [facility]) [[id number]]  Admitting Physician: Gorden Harms [[id number]]  Attending Physician: Gorden Harms [[id number]]  Estimated length of stay: past midnight tomorrow  Certification:: I certify this patient will need inpatient services for at least 2 midnights  PT Class (Do Not Modify): Inpatient [101]  PT Acc Code (Do Not Modify): Private [1]       B Medical/Surgery History Past Medical History:  Diagnosis Date  . Diabetes mellitus without complication ([place])   . Gastroparesis   . Tuberculosis    Past Surgical History:  Procedure Laterality Date  . NO PAST SURGERIES       A IV Location/Drains/Wounds Patient Lines/Drains/Airways Status   Active Line/Drains/Airways    Name:   Placement date:   Placement time:   Site:   Days:   Peripheral IV 01/03/19 Right Antecubital   01/03/19    1625    Antecubital   less than 1   Peripheral IV 01/03/19 Left Arm   01/03/19    1625    Arm   less than 1   Wound / Incision (Open or Dehisced) 02/23/18 Diabetic ulcer Toe (Comment  which one) Right wound is bleeding but not much   02/23/18    0130    Toe (Comment  which one)   314   Wound / Incision (Open or Dehisced) 02/23/18 Diabetic ulcer  Toe (Comment  which one) Left   02/23/18    0130    Toe (Comment  which one)   314          Intake/Output Last 24 hours  Intake/Output Summary (Last 24 hours) at 01/03/2019 1922 Last data filed at 01/03/2019 1904 Gross per 24 hour  Intake 2000 ml  Output 10 ml  Net 1990 ml    Labs/Imaging Results for orders placed or performed during the hospital encounter of 01/03/19 (from the past 48 hour(s))  Glucose, capillary     Status: Abnormal   Collection Time: 01/03/19  4:15 PM  Result Value Ref Range   Glucose-Capillary 376 (H) 70 - 99 mg/dL   Comment 1 Notify RN    Comment 2 Document in Chart   CBC     Status: Abnormal   Collection Time: 01/03/19  4:26 PM  Result Value Ref Range   WBC 11.8 (H) 4.0 - 10.5 K/uL   RBC 3.58 (L) 4.22 - 5.81 MIL/uL   Hemoglobin 11.0 (L) 13.0 - 17.0 g/dL   HCT 32.9 (L) 39.0 - 52.0 %   MCV 91.9 80.0 - 100.0 fL   MCH 30.7 26.0 - 34.0 pg   MCHC 33.4 30.0 - 36.0 g/dL   RDW 12.7 11.5 - 15.5 %   Platelets 300 150 - 400 K/uL   nRBC 0.0  0.0 - 0.2 %    Comment: Performed at [facility], [address] [place], [place] [address]  Comprehensive metabolic panel     Status: Abnormal   Collection Time: 01/03/19  4:26 PM  Result Value Ref Range   Sodium 135 135 - 145 mmol/L   Potassium 4.5 3.5 - 5.1 mmol/L   Chloride 103 98 - 111 mmol/L   CO2 19 (L) 22 - 32 mmol/L   Glucose, Bld 416 (H) 70 - 99 mg/dL   BUN 49 (H) 6 - 20 mg/dL   Creatinine, Ser 2.30 (H) 0.61 - 1.24 mg/dL   Calcium 9.1 8.9 - 10.3 mg/dL   Total Protein 8.6 (H) 6.5 - 8.1 g/dL   Albumin 3.9 3.5 - 5.0 g/dL   AST 12 (L) 15 - 41 U/L   ALT 18 0 - 44 U/L   Alkaline Phosphatase 134 (H) 38 - 126 U/L   Total Bilirubin 0.7 0.3 - 1.2 mg/dL   GFR calc non Af Amer 34 (L) >60 mL/min   GFR calc Af Amer 39 (L) >60 mL/min   Anion gap 13 5 - 15    Comment: Performed at [facility], [address] [place], [place] [address]  Lipase, blood     Status: None   Collection Time: 01/03/19   4:26 PM  Result Value Ref Range   Lipase 33 11 - 51 U/L    Comment: Performed at [facility], [address] [place], [place] [address]  Urinalysis, Complete w Microscopic     Status: Abnormal   Collection Time: 01/03/19  4:26 PM  Result Value Ref Range   Color, Urine YELLOW (A) YELLOW   APPearance HAZY (A) CLEAR   Specific Gravity, Urine 1.020 1.005 - 1.030   pH 5.0 5.0 - 8.0   Glucose, UA >=500 (A) NEGATIVE mg/dL   Hgb urine dipstick NEGATIVE NEGATIVE   Bilirubin Urine NEGATIVE NEGATIVE   Ketones, ur 5 (A) NEGATIVE mg/dL   Protein, ur 30 (A) NEGATIVE mg/dL   Nitrite NEGATIVE NEGATIVE   Leukocytes,Ua NEGATIVE NEGATIVE   WBC, UA 0-5 0 - 5 WBC/hpf   Bacteria, UA NONE SEEN NONE SEEN   Squamous Epithelial / LPF 0-5 0 - 5    Comment: Performed at [facility], [address] [place], [place] [address]  Troponin I - ONCE - STAT     Status: None   Collection Time: 01/03/19  4:26 PM  Result Value Ref Range   Troponin I <0.03 <0.03 ng/mL    Comment: Performed at [facility], [address] [place], [place] [address]  Brain natriuretic peptide     Status: None   Collection Time: 01/03/19  4:26 PM  Result Value Ref Range   B Natriuretic Peptide 20.0 0.0 - 100.0 pg/mL    Comment: Performed at [facility], [address] [place], [place] [address]   Dg Chest 2 View  Result Date: 01/03/2019 CLINICAL DATA:  Vomiting and diarrhea since yesterday, feels like his blood sugar is low, single episode of vomiting and 2-3 episodes of diarrhea today, hypotension, history tuberculosis, diabetes mellitus, GERD EXAM: CHEST - 2 VIEW COMPARISON:  06/20/2018 FINDINGS: Normal heart size, mediastinal contours, and pulmonary vascularity. Chronic opacity in the RIGHT upper lobe adjacent to the minor fissure, likely scarring, unchanged since at least 12/21/2016. Slight chronic accentuation of interstitial markings, stable. No acute infiltrate, pleural effusion or  pneumothorax. Bones unremarkable. IMPRESSION: RIGHT upper lobe scarring and  chronic accentuation of markings. No acute abnormalities. Electronically Signed   By: Lavonia Dana M.D.   On: 01/03/2019 17:33    Pending Labs Unresulted Labs (From admission, onward)    Start     Ordered   01/03/19 1901  Urine drugs of abuse scrn w alc, routine (Ref Lab)  Once,   STAT     01/03/19 1900   Signed and Held  HIV antibody (Routine Testing)  Once,   R     Signed and Held   Signed and Held  CBC  (heparin)  Once,   R    Comments:  Baseline for heparin therapy IF NOT ALREADY DRAWN.  Notify MD if PLT < 100 K.    Signed and Held   Signed and Held  Creatinine, serum  (heparin)  Once,   R    Comments:  Baseline for heparin therapy IF NOT ALREADY DRAWN.    Signed and Held   Signed and Held  Basic metabolic panel  Tomorrow morning,   R     Signed and Held   Signed and Held  Hemoglobin A1c  Once,   R    Comments:  To assess prior glycemic control    Signed and Held   Signed and Held  Hemoglobin A1c  Once,   R     Signed and Held          Vitals/Pain Today's Vitals   01/03/19 1805 01/03/19 1815 01/03/19 1830 01/03/19 1845  BP:  100/65 117/72 122/78  Pulse:  85 88 88  Resp:  11 12 11   Temp:      TempSrc:      SpO2:  96% 99% 100%  Weight:      Height:      PainSc: 0-No pain       Isolation Precautions No active isolations  Medications Medications  sodium chloride 0.9 % bolus 1,000 mL (0 mLs Intravenous Stopped 01/03/19 1805)  sodium chloride 0.9 % bolus 1,000 mL (0 mLs Intravenous Stopped 01/03/19 1904)  insulin aspart (novoLOG) injection 10 Units (10 Units Intravenous Given 01/03/19 1802)    Mobility walks with device Low fall risk   Focused Assessments Pulmonary Assessment Handoff:  Lung sounds: Normal          R Recommendations: See Admitting Provider Note  Report given to:   Additional Notes: Pt given 2L NS and vitals WNL

## 2019-01-03 NOTE — ED Triage Notes (Signed)
Pt reports vomiting and diarrhea since yesterday.  Also feels like his blood sugar is low. Vomit X 1 today. Diarrhea 2-3 X today. Hypotensive.

## 2019-01-04 ENCOUNTER — Inpatient Hospital Stay: Payer: Self-pay

## 2019-01-04 LAB — BASIC METABOLIC PANEL
Anion gap: 10 (ref 5–15)
BUN: 41 mg/dL — ABNORMAL HIGH (ref 6–20)
CO2: 21 mmol/L — ABNORMAL LOW (ref 22–32)
Calcium: 8.6 mg/dL — ABNORMAL LOW (ref 8.9–10.3)
Chloride: 108 mmol/L (ref 98–111)
Creatinine, Ser: 1.63 mg/dL — ABNORMAL HIGH (ref 0.61–1.24)
GFR calc Af Amer: 59 mL/min — ABNORMAL LOW (ref >60)
GFR calc non Af Amer: 51 mL/min — ABNORMAL LOW (ref >60)
Glucose, Bld: 272 mg/dL — ABNORMAL HIGH (ref 70–99)
Potassium: 4.1 mmol/L (ref 3.5–5.1)
Sodium: 139 mmol/L (ref 135–145)

## 2019-01-04 LAB — HEMOGLOBIN A1C
Hgb A1c MFr Bld: 7.2 % — ABNORMAL HIGH (ref 4.8–5.6)
Mean Plasma Glucose: 159.94 mg/dL

## 2019-01-04 LAB — GLUCOSE, CAPILLARY
GLUCOSE-CAPILLARY: 210 mg/dL — AB (ref 70–99)
Glucose-Capillary: 250 mg/dL — ABNORMAL HIGH (ref 70–99)

## 2019-01-04 IMAGING — US RENAL/URINARY TRACT ULTRASOUND
1 series · 14 of 25 positions shown · non-contrast
Comparison: None.

CLINICAL DATA: 42-year-old male with a history of acute renal
failure

EXAM:
RENAL / URINARY TRACT ULTRASOUND COMPLETE

[Series 1: renal/urinary tract ultrasound · 14 of 30 slices shown]
[im 1/30]
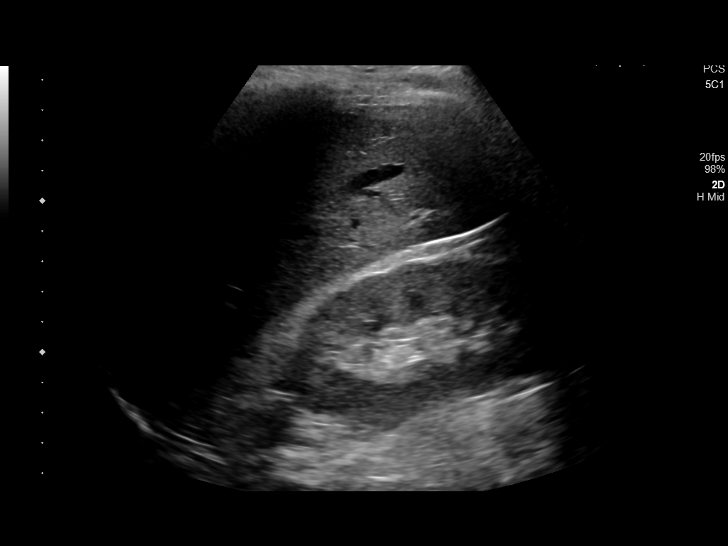
[im 3/30]
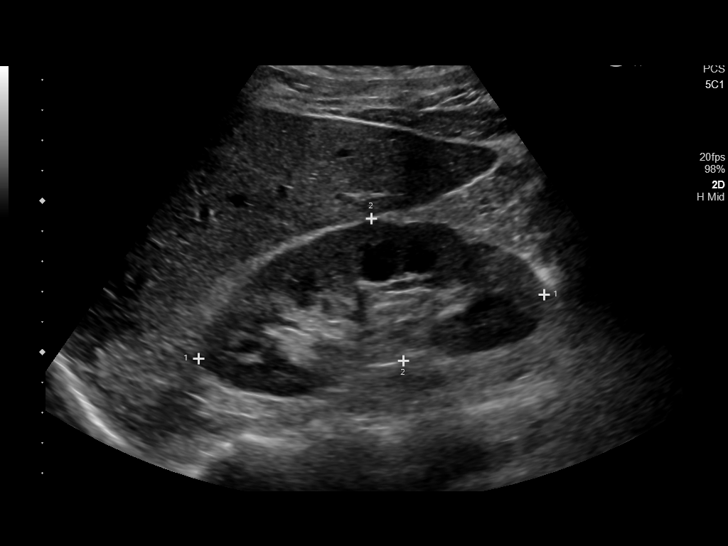
[im 5/30]
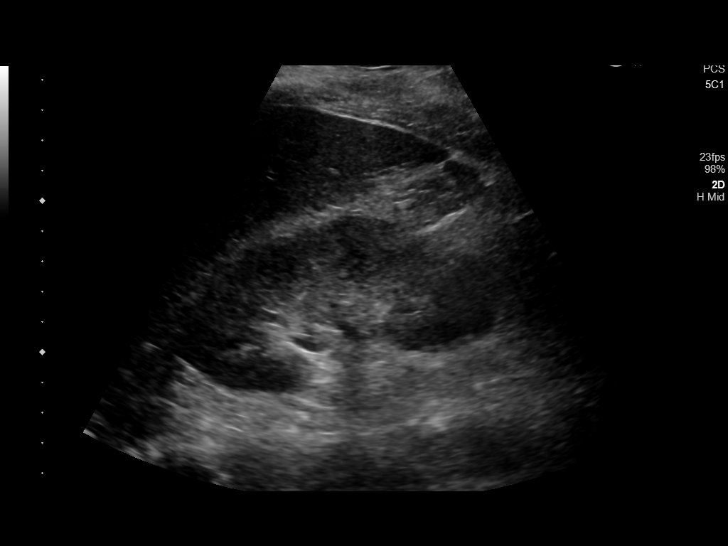
[im 8/30]
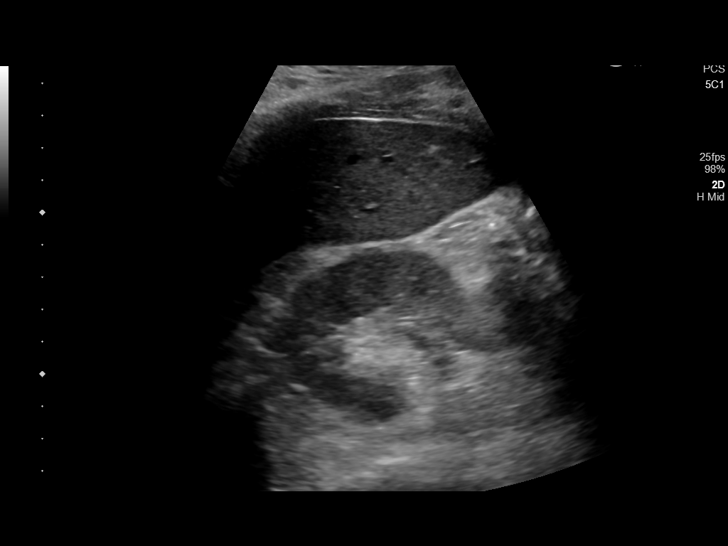
[im 10/30]
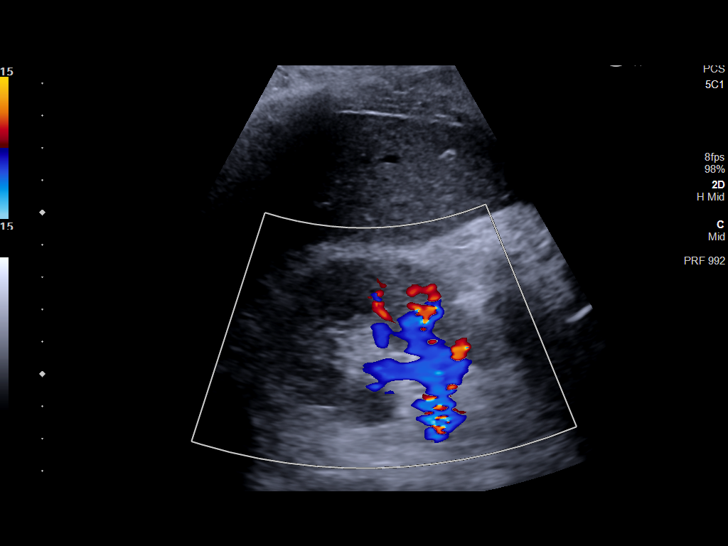
[im 11/30]
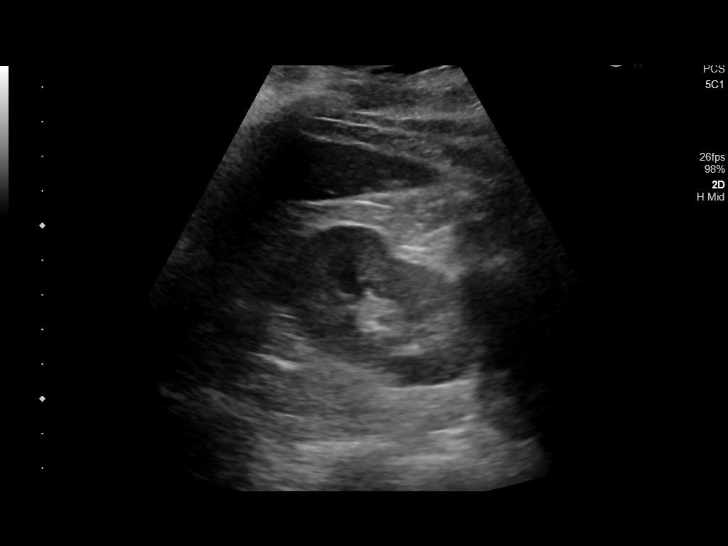
[im 14/30]
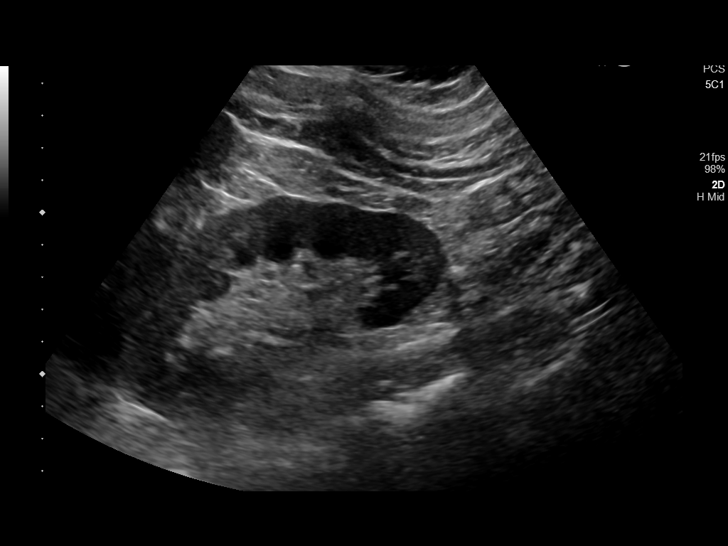
[im 16/30]
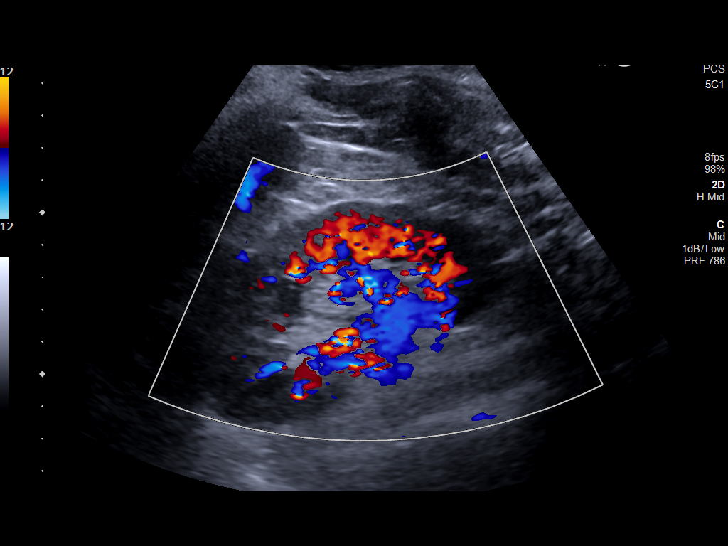
[im 19/30]
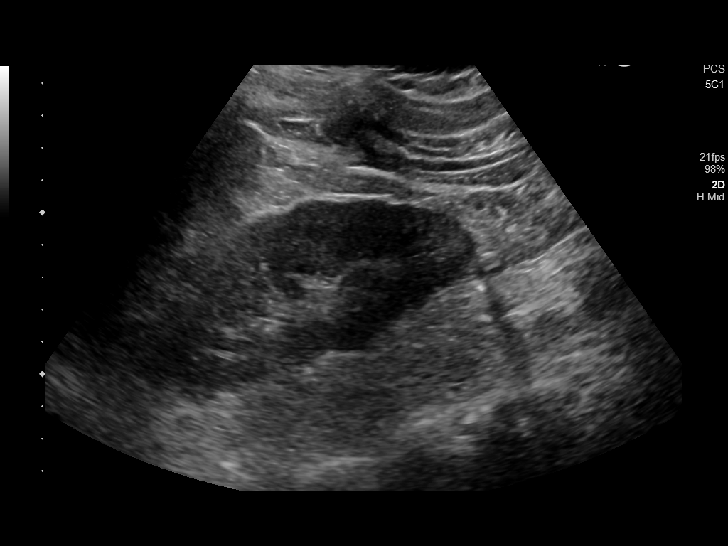
[im 20/30]
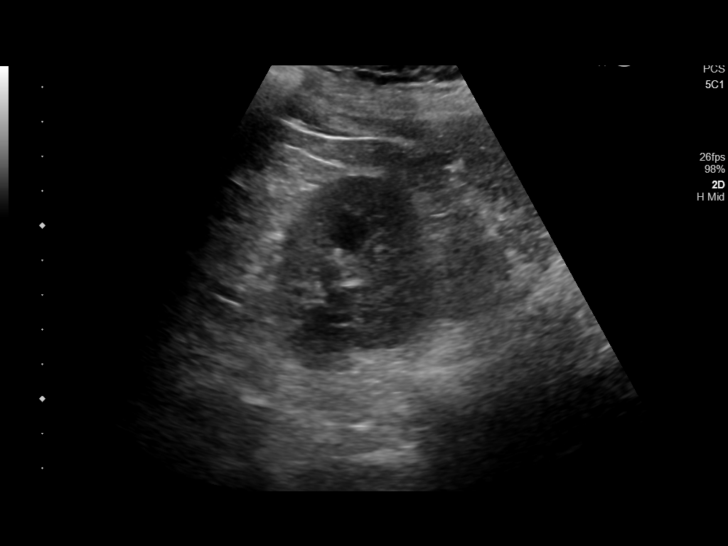
[im 22/30]
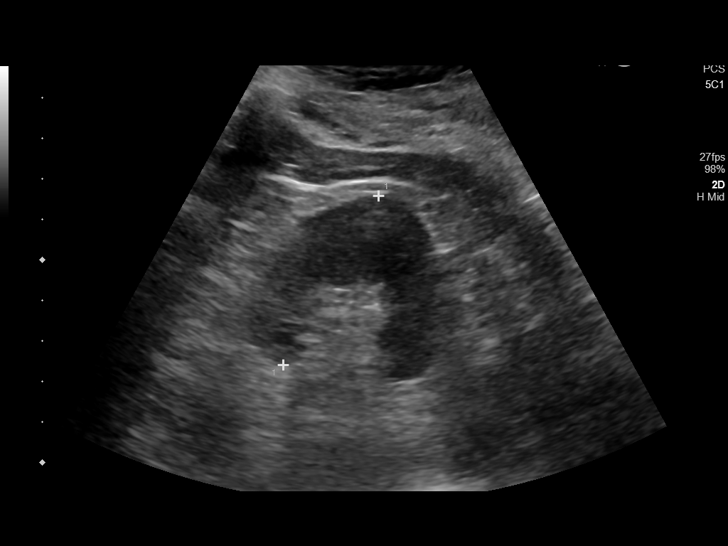
[im 25/30]
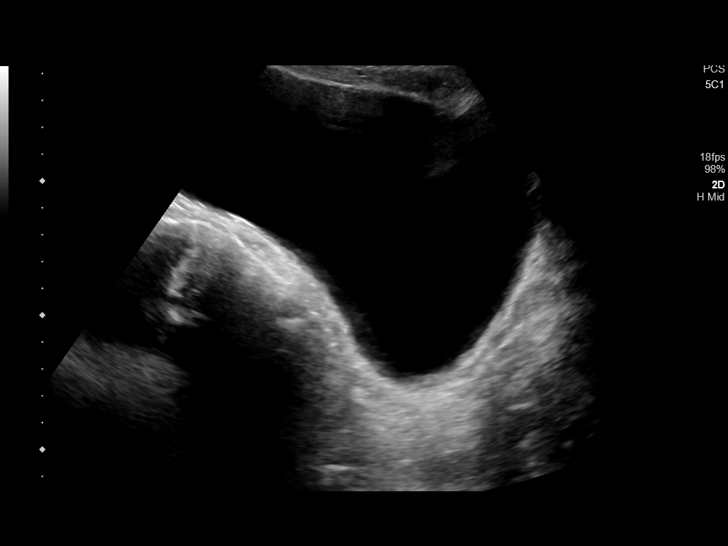
[im 27/30]
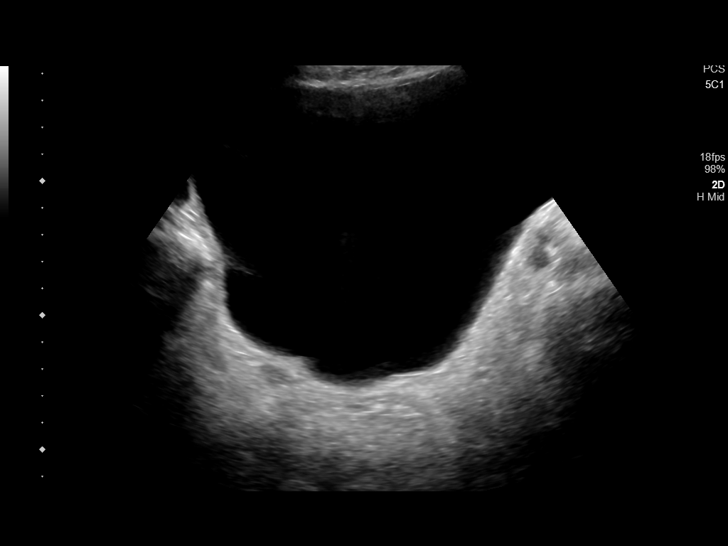
[im 30/30]
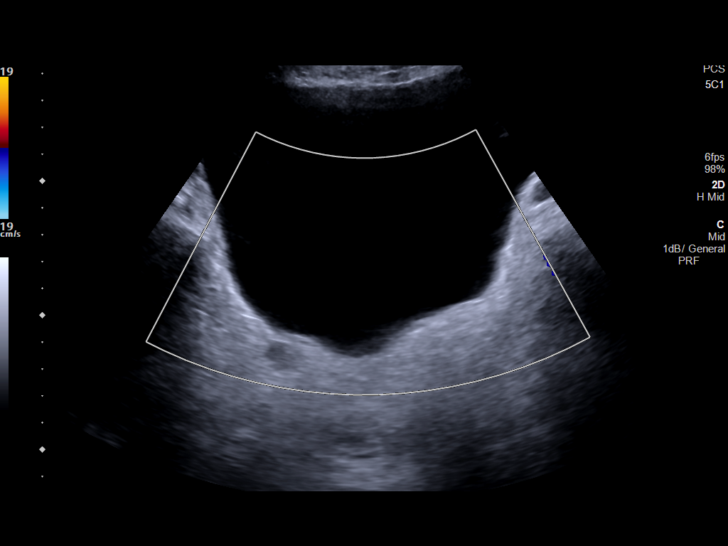

[14 of 25 positions shown; findings below may reference images not displayed]

FINDINGS: Right Kidney:

Length: 11.6 cm x 4.8 cm x 5.2 cm, 152 cc. Echogenicity symmetric to
the contralateral, and similar to the adjacent liver parenchyma. No
hydronephrosis. Flow confirmed in the hilum of the kidney.

Left Kidney:

Length: 10.6 cm x 4.9 cm x 4.8 cm, 131 cc. Echogenicity symmetric to
the right, without hydronephrosis. Flow confirmed in the hilum of
the left kidney.

Bladder:

Appears normal for degree of bladder distention.
IMPRESSION: Unremarkable sonographic survey, with no hydronephrosis.

## 2019-01-04 MED ORDER — LIVING WELL WITH DIABETES BOOK - IN SPANISH: Freq: Once | Status: DC

## 2019-01-04 MED ORDER — INSULIN NPH ISOPHANE & REGULAR (70-30) 100 UNIT/ML ~~LOC~~ SUSP: SUBCUTANEOUS | 0 refills | Status: DC

## 2019-01-04 MED ORDER — INSULIN ASPART PROT & ASPART (70-30 MIX) 100 UNIT/ML ~~LOC~~ SUSP
20.0000 [IU] | Freq: Two times a day (BID) | SUBCUTANEOUS | Status: DC
  Administered 2019-01-04: 20 [IU] via SUBCUTANEOUS
  Filled 2019-01-04: qty 10

## 2019-01-04 MED ORDER — PANTOPRAZOLE SODIUM 40 MG PO TBEC: 40.0000 mg | DELAYED_RELEASE_TABLET | Freq: Every day | ORAL | Status: DC

## 2019-01-04 NOTE — Progress Notes (Addendum)
Inpatient Diabetes Program Recommendations  AACE/ADA: New Consensus Statement on Inpatient Glycemic Control   Target Ranges:  Prepandial:   less than 140 mg/dL      Peak postprandial:   less than 180 mg/dL (1-2 hours)      Critically ill patients:  140 - 180 mg/dL   Results for SHAHIEM, BEDWELL (MRN [id number]) as of 01/04/2019 07:55  Ref. Range 01/03/2019 16:15 01/03/2019 21:04  Glucose-Capillary Latest Ref Range: 70 - 99 mg/dL 376 (H) 279 (H)  Results for KIYON, FIDALGO (MRN [id number]) as of 01/04/2019 07:55  Ref. Range 01/03/2019 16:26 01/04/2019 04:21  Glucose Latest Ref Range: 70 - 99 mg/dL 416 (H) 272 (H)   Results for BOLDEN, HAGERMAN (MRN [id number]) as of 01/04/2019 11:45  Ref. Range 01/03/2019 20:02  Hemoglobin A1C Latest Ref Range: 4.8 - 5.6 % 7.2 (H)   Results for JAHMARION, POPOFF (MRN [id number]) as of 01/04/2019 07:55  Ref. Range 02/23/2018 04:44  Hemoglobin A1C Latest Ref Range: 4.8 - 5.6 % 12.1 (H)      Review of Glycemic Control  Diabetes history: DM2 Outpatient Diabetes medications: 70/30 25 units QAM, 70/30 35 units QPM Current orders for Inpatient glycemic control: 70/30 55 units BID, Novolog 0-20 units TID with meals, Novolog 0-5 units QHS  Inpatient Diabetes Program Recommendations:  Insulin - Basal: Please consider decreasing 70/30 to 20 units BID. A1C: Current A1C 7.2% on 01/03/19 indicating an average glucose of 160 mg/dl.  NOTE: Noted consult for Diabetes Coordinator. Per chart, patient has had very poor DM control for a long time. Last A1C 12.1% on 02/23/18. In the past while inpatient, patient has not required nearly as much insulin as he reports taking as an outpatient. Ordered Living Well with DM book (Spanish) and will plan to talk with patient today regarding DM control.  Addendum 01/04/19-Spoke with patient (with [place], [place]) about diabetes and home regimen for diabetes control. Patient reports that he is followed by  [facility] for diabetes management. Patient reports that he has not taken insulin in about 5 days because he ran out of test strips and he has not been able to go back to the clinic to get more.  Patient states that he was taking 70/30 25 units QAM and 70/30 35 units QPM as an outpatient for diabetes control when he was able to check his glucose.  Ordered Spanish version of Living Well with DM but none available in Spanish so used the [place] version to show pictures and examples of information discussed.  Discussed glucose on admission and discussed importance of checking CBGs and maintaining good CBG control to prevent long-term and short-term complications. Explained how hyperglycemia leads to damage within blood vessels which lead to the common complications seen with uncontrolled diabetes. Stressed to the patient the importance of improving glycemic control to prevent further complications from uncontrolled diabetes. Explained that he needs to consistently take his insulin as prescribed.  Discussed impact of nutrition, exercise, stress, sickness, and medications on diabetes control. Patient states that he tries to follow a carb modified diet but notes that he is not able to do much exercise. Patient reports that he has plenty of insulin at home but he will need to get more test strips from the clinic. Patient states that the clinic closes tomorrow at 12pm so if he is discharged tomorrow or over the weekend he will not be able to get test strips to check his glucose. Informed patient, that I would check with CM to  see if they have any donated glucometers and testing supplies that they could provide him with.  Explained that if he is given a Reli-[employer] Prime glucometer he could purchase more test strips at [facility] ($9 for a box of 50 strips).  Encouraged patient to be sure to take insulin as prescribed, to get more testing supplies, monitor glucose as MD request, and to follow up at [facility].  Patient verbalized understanding of information discussed and he states that he has no further questions at this time related to diabetes.  Thanks, Barnie Alderman, RN, MSN, CDE Diabetes Coordinator Inpatient Diabetes Program [phone] (Team Pager from 8am to 5pm)

## 2019-01-04 NOTE — Plan of Care (Signed)
  Problem: Education: Goal: Knowledge of General Education information will improve Description Including pain rating scale, medication(s)/side effects and non-pharmacologic comfort measures Outcome: Progressing   

## 2019-01-04 NOTE — Discharge Summary (Signed)
[facility] [place] NAME: Gregory Moody    MR#:  [id number]  DATE OF BIRTH:  02-Nov-1975  DATE OF ADMISSION:  01/03/2019   ADMITTING PHYSICIAN: Gorden Harms, MD  DATE OF DISCHARGE: 01/04/2019 PRIMARY CARE PHYSICIAN: [place]   ADMISSION DIAGNOSIS:  ARF (acute renal failure) (HCC) [N17.9] Hyperglycemia [R73.9] Intractable nausea and vomiting [R11.2] Hypotension, unspecified hypotension type [I95.9] Acute renal failure, unspecified acute renal failure type ([place]) [N17.9] DISCHARGE DIAGNOSIS:  Active Problems:   ARF (acute renal failure) ([place])  SECONDARY DIAGNOSIS:   Past Medical History:  Diagnosis Date   Diabetes mellitus without complication ([place])    Gastroparesis    Tuberculosis    HOSPITAL COURSE:  *Acute kidney injury Most likely secondary to acute nausea/vomiting acute on chronic diarrhea, poor p.o. intake Improving with IV fluid support.  Encourage oral intake.  Renal ultrasound is unremarkable.  *Acute hypotension Most likely secondary to nausea/vomiting, acute on chronic diarrhea Improved with IV fluid support, fall precautions  *Acute nausea/emesis with acute on chronic diarrhea Case confounded by history of diabetes with gastroparesis, noted acute hyperglycemia without DKA Antiemetics PRN, Lactinex 3 times daily, strict I&O monitoring, follow-up on urinalysis, urine drug screen.  *Chronic diarrhea Patient states that he has had chronic diarrhea since treatment for his tuberculosis Lactinex 3 times daily, consider Imodium  *Acute hyperglycemia chronic diabetes mellitus type 2 with history of gastroparesis Decreased insulin 70/30 20 units twice daily, sliding scale insulin with Accu-Cheks per routine.  Resume home dose after discharge per diabetes coordinator. DISCHARGE CONDITIONS:  Stable, discharge to home today. CONSULTS OBTAINED:   DRUG ALLERGIES:  No Known  Allergies DISCHARGE MEDICATIONS:   Allergies as of 01/04/2019   No Known Allergies     Medication List    TAKE these medications   aspirin EC 81 MG tablet Take 81 mg by mouth daily.   FLUoxetine 20 MG capsule Commonly known as:  PROZAC Take 20 mg by mouth daily.   insulin NPH-regular Human (70-30) 100 UNIT/ML injection 25 units with breakfast in am and 35 units with supper What changed:    how much to take  how to take this  when to take this  additional instructions   ranitidine 150 MG tablet Commonly known as:  ZANTAC Take 150 mg by mouth 2 (two) times daily.        DISCHARGE INSTRUCTIONS:  See AVS.  If you experience worsening of your admission symptoms, develop shortness of breath, life threatening emergency, suicidal or homicidal thoughts you must seek medical attention immediately by calling 911 or calling your MD immediately  if symptoms less severe.  You Must read complete instructions/literature along with all the possible adverse reactions/side effects for all the Medicines you take and that have been prescribed to you. Take any new Medicines after you have completely understood and accpet all the possible adverse reactions/side effects.   Please note  You were cared for by a hospitalist during your hospital stay. If you have any questions about your discharge medications or the care you received while you were in the hospital after you are discharged, you can call the unit and asked to speak with the hospitalist on call if the hospitalist that took care of you is not available. Once you are discharged, your primary care physician will handle any further medical issues. Please note that NO REFILLS for any discharge medications will be authorized once you are discharged, as it  is imperative that you return to your primary care physician (or establish a relationship with a primary care physician if you do not have one) for your aftercare needs so that they can  reassess your need for medications and monitor your lab values.    On the day of Discharge:  VITAL SIGNS:  Blood pressure 124/79, pulse 68, temperature 98.6 F (37 C), temperature source Oral, resp. rate 17, height 5\' 8"  (1.727 m), weight 68 kg, SpO2 100 %. PHYSICAL EXAMINATION:  GENERAL:  43 y.o.-year-old patient lying in the bed with no acute distress.  EYES: Pupils equal, round, reactive to light and accommodation. No scleral icterus. Extraocular muscles intact.  HEENT: Head atraumatic, normocephalic. Oropharynx and nasopharynx clear.  NECK:  Supple, no jugular venous distention. No thyroid enlargement, no tenderness.  LUNGS: Normal breath sounds bilaterally, no wheezing, rales,rhonchi or crepitation. No use of accessory muscles of respiration.  CARDIOVASCULAR: S1, S2 normal. No murmurs, rubs, or gallops.  ABDOMEN: Soft, non-tender, non-distended. Bowel sounds present. No organomegaly or mass.  EXTREMITIES: No pedal edema, cyanosis, or clubbing.  NEUROLOGIC: Cranial nerves II through XII are intact. Muscle strength 5/5 in all extremities. Sensation intact. Gait not checked.  PSYCHIATRIC: The patient is alert and oriented x 3.  SKIN: No obvious rash, lesion, or ulcer.  DATA REVIEW:   CBC Recent Labs  Lab 01/03/19 1626  WBC 11.8*  HGB 11.0*  HCT 32.9*  PLT 300    Chemistries  Recent Labs  Lab 01/03/19 1626 01/04/19 0421  NA 135 139  K 4.5 4.1  CL 103 108  CO2 19* 21*  GLUCOSE 416* 272*  BUN 49* 41*  CREATININE 2.30* 1.63*  CALCIUM 9.1 8.6*  AST 12*  --   ALT 18  --   ALKPHOS 134*  --   BILITOT 0.7  --      Microbiology Results  Results for orders placed or performed during the hospital encounter of 04/21/17  Culture, blood (routine x 2)     Status: Abnormal   Collection Time: 04/21/17 10:55 PM  Result Value Ref Range Status   Specimen Description BLOOD RT HAND  Final   Special Requests Blood Culture adequate volume  Final   Culture  Setup Time   Final     GRAM POSITIVE COCCI GRAM NEGATIVE RODS AEROBIC BOTTLE ONLY CRITICAL RESULT CALLED TO, READ BACK BY AND VERIFIED WITH: MATT MCBANE AT 9417 ON 04/22/17 RWW    Culture (A)  Final    ESCHERICHIA COLI STAPHYLOCOCCUS SPECIES (COAGULASE NEGATIVE) SUSCEPTIBILITIES PERFORMED ON PREVIOUS CULTURE WITHIN THE LAST 5 DAYS. Performed at [facility], [place] [address] [place], [place] [address]    Report Status 04/25/2017 FINAL  Final   Organism ID, Bacteria ESCHERICHIA COLI  Final      Susceptibility   Escherichia coli - MIC*    AMPICILLIN >=32 RESISTANT Resistant     CEFAZOLIN 16 SENSITIVE Sensitive     CEFEPIME <=1 SENSITIVE Sensitive     CEFTAZIDIME <=1 SENSITIVE Sensitive     CEFTRIAXONE <=1 SENSITIVE Sensitive     CIPROFLOXACIN <=0.25 SENSITIVE Sensitive     GENTAMICIN <=1 SENSITIVE Sensitive     IMIPENEM <=0.25 SENSITIVE Sensitive     TRIMETH/SULFA <=20 SENSITIVE Sensitive     AMPICILLIN/SULBACTAM >=32 RESISTANT Resistant     PIP/TAZO <=4 SENSITIVE Sensitive     Extended ESBL NEGATIVE Sensitive     * ESCHERICHIA COLI  Blood Culture ID Panel (Reflexed)     Status:  Abnormal   Collection Time: 04/21/17 10:55 PM  Result Value Ref Range Status   Enterococcus species NOT DETECTED NOT DETECTED Final   Listeria monocytogenes NOT DETECTED NOT DETECTED Final   Staphylococcus species DETECTED (A) NOT DETECTED Final    Comment: Methicillin (oxacillin) resistant coagulase negative staphylococcus. Possible blood culture contaminant (unless isolated from more than one blood culture draw or clinical case suggests pathogenicity). No antibiotic treatment is indicated for blood  culture contaminants. CRITICAL RESULT CALLED TO, READ BACK BY AND VERIFIED WITH: JASON ROBBINS AT 1540 ON 04/22/2017 JJB    Staphylococcus aureus (BCID) NOT DETECTED NOT DETECTED Final   Methicillin resistance DETECTED (A) NOT DETECTED Final    Comment: CRITICAL RESULT CALLED TO, READ BACK BY AND VERIFIED WITH: JASON ROBBINS  AT 1540 ON 04/22/2017 JJB    Streptococcus species NOT DETECTED NOT DETECTED Final   Streptococcus agalactiae NOT DETECTED NOT DETECTED Final   Streptococcus pneumoniae NOT DETECTED NOT DETECTED Final   Streptococcus pyogenes NOT DETECTED NOT DETECTED Final   Acinetobacter baumannii NOT DETECTED NOT DETECTED Final   Enterobacteriaceae species NOT DETECTED NOT DETECTED Final   Enterobacter cloacae complex NOT DETECTED NOT DETECTED Final   Escherichia coli NOT DETECTED NOT DETECTED Final   Klebsiella oxytoca NOT DETECTED NOT DETECTED Final   Klebsiella pneumoniae NOT DETECTED NOT DETECTED Final   Proteus species NOT DETECTED NOT DETECTED Final   Serratia marcescens NOT DETECTED NOT DETECTED Final   Haemophilus influenzae NOT DETECTED NOT DETECTED Final   Neisseria meningitidis NOT DETECTED NOT DETECTED Final   Pseudomonas aeruginosa NOT DETECTED NOT DETECTED Final   Candida albicans NOT DETECTED NOT DETECTED Final   Candida glabrata NOT DETECTED NOT DETECTED Final   Candida krusei NOT DETECTED NOT DETECTED Final   Candida parapsilosis NOT DETECTED NOT DETECTED Final   Candida tropicalis NOT DETECTED NOT DETECTED Final  Blood Culture ID Panel (Reflexed)     Status: Abnormal   Collection Time: 04/21/17 10:55 PM  Result Value Ref Range Status   Enterococcus species NOT DETECTED NOT DETECTED Final   Listeria monocytogenes NOT DETECTED NOT DETECTED Final   Staphylococcus species DETECTED (A) NOT DETECTED Final    Comment: Methicillin (oxacillin) resistant coagulase negative staphylococcus. Possible blood culture contaminant (unless isolated from more than one blood culture draw or clinical case suggests pathogenicity). No antibiotic treatment is indicated for blood  culture contaminants. CRITICAL RESULT CALLED TO, READ BACK BY AND VERIFIED WITH: MATT MCBANE AT 2255 ON 04/22/17 RWW    Staphylococcus aureus (BCID) NOT DETECTED NOT DETECTED Final   Methicillin resistance DETECTED (A) NOT  DETECTED Final    Comment: CRITICAL RESULT CALLED TO, READ BACK BY AND VERIFIED WITH: MATT MCBANE AT 2255 ON 04/22/17 RWW    Streptococcus species NOT DETECTED NOT DETECTED Final   Streptococcus agalactiae NOT DETECTED NOT DETECTED Final   Streptococcus pneumoniae NOT DETECTED NOT DETECTED Final   Streptococcus pyogenes NOT DETECTED NOT DETECTED Final   Acinetobacter baumannii NOT DETECTED NOT DETECTED Final   Enterobacteriaceae species DETECTED (A) NOT DETECTED Final    Comment: Enterobacteriaceae represent a large family of gram-negative bacteria, not a single organism. CRITICAL RESULT CALLED TO, READ BACK BY AND VERIFIED WITH: MATT MCBANE AT 2255 ON 04/22/17 RWW    Enterobacter cloacae complex NOT DETECTED NOT DETECTED Final   Escherichia coli DETECTED (A) NOT DETECTED Final    Comment: CRITICAL RESULT CALLED TO, READ BACK BY AND VERIFIED WITH: MATT MCBANE AT 2255 ON 04/22/17 RWW  Klebsiella oxytoca NOT DETECTED NOT DETECTED Final   Klebsiella pneumoniae NOT DETECTED NOT DETECTED Final   Proteus species NOT DETECTED NOT DETECTED Final   Serratia marcescens NOT DETECTED NOT DETECTED Final   Carbapenem resistance NOT DETECTED NOT DETECTED Final   Haemophilus influenzae NOT DETECTED NOT DETECTED Final   Neisseria meningitidis NOT DETECTED NOT DETECTED Final   Pseudomonas aeruginosa NOT DETECTED NOT DETECTED Final   Candida albicans NOT DETECTED NOT DETECTED Final   Candida glabrata NOT DETECTED NOT DETECTED Final   Candida krusei NOT DETECTED NOT DETECTED Final   Candida parapsilosis NOT DETECTED NOT DETECTED Final   Candida tropicalis NOT DETECTED NOT DETECTED Final  Culture, blood (routine x 2)     Status: Abnormal   Collection Time: 04/21/17 10:56 PM  Result Value Ref Range Status   Specimen Description BLOOD RIGHT HAND  Final   Special Requests   Final    BOTTLES DRAWN AEROBIC ONLY Blood Culture adequate volume   Culture  Setup Time   Final    GRAM POSITIVE COCCI IN BOTH  AEROBIC AND ANAEROBIC BOTTLES CRITICAL RESULT CALLED TO, READ BACK BY AND VERIFIED WITH: JASON ROBBINS AT 1540 ON 04/22/2017 JJB    Culture STAPHYLOCOCCUS SPECIES (COAGULASE NEGATIVE) (A)  Final   Report Status 04/24/2017 FINAL  Final   Organism ID, Bacteria STAPHYLOCOCCUS SPECIES (COAGULASE NEGATIVE)  Final      Susceptibility   Staphylococcus species (coagulase negative) - MIC*    CIPROFLOXACIN <=0.5 SENSITIVE Sensitive     ERYTHROMYCIN 0.5 SENSITIVE Sensitive     GENTAMICIN <=0.5 SENSITIVE Sensitive     OXACILLIN <=0.25 SENSITIVE Sensitive     TETRACYCLINE <=1 SENSITIVE Sensitive     VANCOMYCIN <=0.5 SENSITIVE Sensitive     TRIMETH/SULFA <=10 SENSITIVE Sensitive     CLINDAMYCIN <=0.25 SENSITIVE Sensitive     RIFAMPIN >=32 RESISTANT Resistant     Inducible Clindamycin NEGATIVE Sensitive     * STAPHYLOCOCCUS SPECIES (COAGULASE NEGATIVE)    RADIOLOGY:  Dg Chest 2 View  Result Date: 01/03/2019 CLINICAL DATA:  Vomiting and diarrhea since yesterday, feels like his blood sugar is low, single episode of vomiting and 2-3 episodes of diarrhea today, hypotension, history tuberculosis, diabetes mellitus, GERD EXAM: CHEST - 2 VIEW COMPARISON:  06/20/2018 FINDINGS: Normal heart size, mediastinal contours, and pulmonary vascularity. Chronic opacity in the RIGHT upper lobe adjacent to the minor fissure, likely scarring, unchanged since at least 12/21/2016. Slight chronic accentuation of interstitial markings, stable. No acute infiltrate, pleural effusion or pneumothorax. Bones unremarkable. IMPRESSION: RIGHT upper lobe scarring and chronic accentuation of markings. No acute abnormalities. Electronically Signed   By: Lavonia Dana M.D.   On: 01/03/2019 17:33   US Renal  Result Date: 01/04/2019 CLINICAL DATA:  43 year old male with a history of acute renal failure EXAM: RENAL / URINARY TRACT ULTRASOUND COMPLETE COMPARISON:  None. FINDINGS: Right Kidney: Length: 11.6 cm x 4.8 cm x 5.2 cm, 152 cc.  Echogenicity symmetric to the contralateral, and similar to the adjacent liver parenchyma. No hydronephrosis. Flow confirmed in the hilum of the kidney. Left Kidney: Length: 10.6 cm x 4.9 cm x 4.8 cm, 131 cc. Echogenicity symmetric to the right, without hydronephrosis. Flow confirmed in the hilum of the left kidney. Bladder: Appears normal for degree of bladder distention. IMPRESSION: Unremarkable sonographic survey, with no hydronephrosis. Electronically Signed   By: Corrie Mckusick D.O.   On: 01/04/2019 08:51     Management plans discussed with the patient, family and they are  in agreement.  CODE STATUS: Full Code   TOTAL TIME TAKING CARE OF THIS PATIENT: 32 minutes.    Demetrios Loll M.D on 01/04/2019 at 1:41 PM  Between 7am to 6pm - Pager - [phone]  After 6pm go to [email or web address] - [employer] [facility]  Office  [phone]  CC: Primary care physician; [place]   Note: This dictation was prepared with Dragon dictation along with smaller Company secretary. Any transcriptional errors that result from this process are unintentional.

## 2019-01-04 NOTE — Progress Notes (Signed)
Patient cleared for discharge.       Education complete. AVS printed in [place]. Discharge instructions given. All questions answered for patient clarification.  Glucometer given, pharmacy verified.  IV removed.  Discharged to home via POV

## 2019-01-04 NOTE — TOC Transition Note (Signed)
Transition of Care [facility]) - CM/SW Discharge Note   Patient Details  Name: Gregory Moody MRN: [id number] Date of Birth: [birth year]  [facility] CM/SW Contact:  Beverly Sessions, RN Phone Number: 01/04/2019, 3:11 PM   Clinical Narrative:    Patient to discharge today.  Patient state that he live at home with his daughter.   Is unemployed.  Followed by [facility], and obtains his insulin from their pharmacy.  Patient confirms he has insulin in the home, and can obtain more when needed. Patient states that he has run out of testing supplies for his meter, and may not be able to obtain more from the clinic, due to current restrictions.  RNCM provided glucometer testing kit.  Kit includes reli on prime meter.  Patient informed that when he runs out he can purchase more testing strips at [facility].  Patient denies issues with transportation, and family will transport at discharge.    Final next level of care: Home/Self Care Barriers to Discharge: Barriers Resolved   Patient Goals and CMS Choice        Discharge Placement                       Discharge Plan and Services   Discharge Planning Services: CM Consult, Other - See comment(glucometer and supplies)                      Social Determinants of Health (SDOH) Interventions     Readmission Risk Interventions No flowsheet data found.

## 2019-01-04 NOTE — Progress Notes (Signed)
[facility] at [place] NAME: Gregory Moody    MR#:  [id number]  DATE OF BIRTH:  12/10/1975  SUBJECTIVE:  CHIEF COMPLAINT:   Chief Complaint  Patient presents with  . Emesis  . Diarrhea   Weakness and chronic diarrhea REVIEW OF SYSTEMS:  Review of Systems  Constitutional: Positive for malaise/fatigue. Negative for chills and fever.  HENT: Negative for sore throat.   Eyes: Negative for blurred vision and double vision.  Respiratory: Negative for cough, hemoptysis, shortness of breath, wheezing and stridor.   Cardiovascular: Negative for chest pain, palpitations, orthopnea and leg swelling.  Gastrointestinal: Positive for diarrhea. Negative for abdominal pain, blood in stool, melena, nausea and vomiting.  Genitourinary: Negative for dysuria, flank pain and hematuria.  Musculoskeletal: Negative for back pain and joint pain.  Skin: Negative for rash.  Neurological: Negative for dizziness, sensory change, focal weakness, seizures, loss of consciousness, weakness and headaches.  Endo/Heme/Allergies: Negative for polydipsia.  Psychiatric/Behavioral: Negative for depression. The patient is not nervous/anxious.     DRUG ALLERGIES:  No Known Allergies VITALS:  Blood pressure 124/79, pulse 68, temperature 98.6 F (37 C), temperature source Oral, resp. rate 17, height 5\' 8"  (1.727 m), weight 68 kg, SpO2 100 %. PHYSICAL EXAMINATION:  Physical Exam Constitutional:      General: He is not in acute distress.    Appearance: Normal appearance.  HENT:     Head: Normocephalic.     Mouth/Throat:     Mouth: Mucous membranes are moist.  Eyes:     General: No scleral icterus.    Conjunctiva/sclera: Conjunctivae normal.     Pupils: Pupils are equal, round, and reactive to light.  Neck:     Musculoskeletal: Normal range of motion and neck supple.     Vascular: No JVD.     Trachea: No tracheal deviation.  Cardiovascular:     Rate and  Rhythm: Normal rate and regular rhythm.     Heart sounds: Normal heart sounds. No murmur. No gallop.   Pulmonary:     Effort: Pulmonary effort is normal. No respiratory distress.     Breath sounds: Normal breath sounds. No wheezing or rales.  Abdominal:     General: Bowel sounds are normal. There is no distension.     Palpations: Abdomen is soft.     Tenderness: There is no abdominal tenderness. There is no rebound.  Musculoskeletal: Normal range of motion.        General: No tenderness.     Right lower leg: No edema.     Left lower leg: No edema.  Skin:    Findings: No erythema or rash.  Neurological:     General: No focal deficit present.     Mental Status: He is alert and oriented to person, place, and time.     Cranial Nerves: No cranial nerve deficit.  Psychiatric:        Mood and Affect: Mood normal.    LABORATORY PANEL:  Male CBC Recent Labs  Lab 01/03/19 1626  WBC 11.8*  HGB 11.0*  HCT 32.9*  PLT 300   ------------------------------------------------------------------------------------------------------------------ Chemistries  Recent Labs  Lab 01/03/19 1626 01/04/19 0421  NA 135 139  K 4.5 4.1  CL 103 108  CO2 19* 21*  GLUCOSE 416* 272*  BUN 49* 41*  CREATININE 2.30* 1.63*  CALCIUM 9.1 8.6*  AST 12*  --   ALT 18  --   ALKPHOS 134*  --  BILITOT 0.7  --    RADIOLOGY:  Dg Chest 2 View  Result Date: 01/03/2019 CLINICAL DATA:  Vomiting and diarrhea since yesterday, feels like his blood sugar is low, single episode of vomiting and 2-3 episodes of diarrhea today, hypotension, history tuberculosis, diabetes mellitus, GERD EXAM: CHEST - 2 VIEW COMPARISON:  06/20/2018 FINDINGS: Normal heart size, mediastinal contours, and pulmonary vascularity. Chronic opacity in the RIGHT upper lobe adjacent to the minor fissure, likely scarring, unchanged since at least 12/21/2016. Slight chronic accentuation of interstitial markings, stable. No acute infiltrate, pleural  effusion or pneumothorax. Bones unremarkable. IMPRESSION: RIGHT upper lobe scarring and chronic accentuation of markings. No acute abnormalities. Electronically Signed   By: Lavonia Dana M.D.   On: 01/03/2019 17:33   US Renal  Result Date: 01/04/2019 CLINICAL DATA:  43 year old male with a history of acute renal failure EXAM: RENAL / URINARY TRACT ULTRASOUND COMPLETE COMPARISON:  None. FINDINGS: Right Kidney: Length: 11.6 cm x 4.8 cm x 5.2 cm, 152 cc. Echogenicity symmetric to the contralateral, and similar to the adjacent liver parenchyma. No hydronephrosis. Flow confirmed in the hilum of the kidney. Left Kidney: Length: 10.6 cm x 4.9 cm x 4.8 cm, 131 cc. Echogenicity symmetric to the right, without hydronephrosis. Flow confirmed in the hilum of the left kidney. Bladder: Appears normal for degree of bladder distention. IMPRESSION: Unremarkable sonographic survey, with no hydronephrosis. Electronically Signed   By: Corrie Mckusick D.O.   On: 01/04/2019 08:51   ASSESSMENT AND PLAN:   *Acute kidney injury Most likely secondary to acute nausea/vomiting acute on chronic diarrhea, poor p.o. intake He is being treated with IV fluid support.  Encourage oral intake.  Renal ultrasound is unremarkable.  *Acute hypotension Most likely secondary to nausea/vomiting, acute on chronic diarrhea Improved with IV fluid support, fall precautions  *Acute nausea/emesis with acute on chronic diarrhea Case confounded by history of diabetes with gastroparesis, noted acute hyperglycemia without DKA Antiemetics PRN, Lactinex 3 times daily, strict I&O monitoring, follow-up on urinalysis, urine drug screen.  *Chronic diarrhea Patient states that he has had chronic diarrhea since treatment for his tuberculosis Lactinex 3 times daily, consider Imodium  *Acute hyperglycemia chronic diabetes mellitus type 2 with history of gastroparesis Continue insulin 70/30 20 units twice daily, sliding scale insulin with Accu-Cheks per  routine.  All the records are reviewed and case discussed with Care Management/Social Worker. Management plans discussed with the patient, family and they are in agreement.  CODE STATUS: Full Code  TOTAL TIME TAKING CARE OF THIS PATIENT: 32 minutes.   More than 50% of the time was spent in counseling/coordination of care: YES  POSSIBLE D/C IN 2 DAYS, DEPENDING ON CLINICAL CONDITION.   Demetrios Loll M.D on 01/04/2019 at 1:02 PM  Between 7am to 6pm - Pager - [phone]  After 6pm go to [email or web address] - [facility]

## 2019-01-05 LAB — HIV ANTIBODY (ROUTINE TESTING W REFLEX): HIV Screen 4th Generation wRfx: NONREACTIVE

## 2019-02-02 ENCOUNTER — Other Ambulatory Visit: Payer: Self-pay

## 2019-02-02 ENCOUNTER — Inpatient Hospital Stay
Admission: EM | Admit: 2019-02-02 | Discharge: 2019-02-04 | DRG: 640 | Disposition: A | Discharge status: Home / Self Care | Payer: Self-pay | Attending: Internal Medicine | Admitting: Internal Medicine

## 2019-02-02 ENCOUNTER — Encounter: Payer: Self-pay | Admitting: Emergency Medicine

## 2019-02-02 DIAGNOSIS — I959 Hypotension, unspecified: Secondary | ICD-10-CM | POA: Diagnosis present

## 2019-02-02 DIAGNOSIS — E1310 Other specified diabetes mellitus with ketoacidosis without coma: Secondary | ICD-10-CM | POA: Diagnosis present

## 2019-02-02 DIAGNOSIS — F1011 Alcohol abuse, in remission: Secondary | ICD-10-CM | POA: Diagnosis present

## 2019-02-02 DIAGNOSIS — T383X6A Underdosing of insulin and oral hypoglycemic [antidiabetic] drugs, initial encounter: Secondary | ICD-10-CM | POA: Diagnosis present

## 2019-02-02 DIAGNOSIS — R1310 Dysphagia, unspecified: Secondary | ICD-10-CM | POA: Diagnosis present

## 2019-02-02 DIAGNOSIS — N179 Acute kidney failure, unspecified: Secondary | ICD-10-CM | POA: Diagnosis present

## 2019-02-02 DIAGNOSIS — Z794 Long term (current) use of insulin: Secondary | ICD-10-CM

## 2019-02-02 DIAGNOSIS — Z7982 Long term (current) use of aspirin: Secondary | ICD-10-CM

## 2019-02-02 DIAGNOSIS — D638 Anemia in other chronic diseases classified elsewhere: Secondary | ICD-10-CM | POA: Diagnosis present

## 2019-02-02 DIAGNOSIS — Z79899 Other long term (current) drug therapy: Secondary | ICD-10-CM

## 2019-02-02 DIAGNOSIS — Z833 Family history of diabetes mellitus: Secondary | ICD-10-CM

## 2019-02-02 DIAGNOSIS — Z87891 Personal history of nicotine dependence: Secondary | ICD-10-CM

## 2019-02-02 DIAGNOSIS — E1122 Type 2 diabetes mellitus with diabetic chronic kidney disease: Secondary | ICD-10-CM | POA: Diagnosis present

## 2019-02-02 DIAGNOSIS — K210 Gastro-esophageal reflux disease with esophagitis: Secondary | ICD-10-CM | POA: Diagnosis present

## 2019-02-02 DIAGNOSIS — B909 Sequelae of respiratory and unspecified tuberculosis: Secondary | ICD-10-CM

## 2019-02-02 DIAGNOSIS — N183 Chronic kidney disease, stage 3 (moderate): Secondary | ICD-10-CM | POA: Diagnosis present

## 2019-02-02 DIAGNOSIS — K449 Diaphragmatic hernia without obstruction or gangrene: Secondary | ICD-10-CM | POA: Diagnosis present

## 2019-02-02 DIAGNOSIS — K3184 Gastroparesis: Secondary | ICD-10-CM | POA: Diagnosis present

## 2019-02-02 DIAGNOSIS — E440 Moderate protein-calorie malnutrition: Secondary | ICD-10-CM | POA: Diagnosis present

## 2019-02-02 DIAGNOSIS — Z6820 Body mass index (BMI) 20.0-20.9, adult: Secondary | ICD-10-CM

## 2019-02-02 DIAGNOSIS — E1143 Type 2 diabetes mellitus with diabetic autonomic (poly)neuropathy: Secondary | ICD-10-CM | POA: Diagnosis present

## 2019-02-02 DIAGNOSIS — E860 Dehydration: Principal | ICD-10-CM | POA: Diagnosis present

## 2019-02-02 DIAGNOSIS — R112 Nausea with vomiting, unspecified: Secondary | ICD-10-CM | POA: Diagnosis present

## 2019-02-02 LAB — GLUCOSE, CAPILLARY
Glucose-Capillary: 297 mg/dL — ABNORMAL HIGH (ref 70–99)
Glucose-Capillary: 249 mg/dL — ABNORMAL HIGH (ref 70–99)
Glucose-Capillary: 141 mg/dL — ABNORMAL HIGH (ref 70–99)
Glucose-Capillary: 126 mg/dL — ABNORMAL HIGH (ref 70–99)

## 2019-02-02 LAB — HEMOGLOBIN A1C
Hgb A1c MFr Bld: 8.7 % — ABNORMAL HIGH (ref 4.8–5.6)
Mean Plasma Glucose: 202.99 mg/dL

## 2019-02-02 LAB — CBC
HCT: 34.9 % — ABNORMAL LOW (ref 39.0–52.0)
Hemoglobin: 11.3 g/dL — ABNORMAL LOW (ref 13.0–17.0)
MCH: 29.5 pg (ref 26.0–34.0)
MCHC: 32.4 g/dL (ref 30.0–36.0)
MCV: 91.1 fL (ref 80.0–100.0)
Platelets: 401 10*3/uL — ABNORMAL HIGH (ref 150–400)
RBC: 3.83 MIL/uL — ABNORMAL LOW (ref 4.22–5.81)
RDW: 12.3 % (ref 11.5–15.5)
WBC: 8.9 10*3/uL (ref 4.0–10.5)
nRBC: 0 % (ref 0.0–0.2)

## 2019-02-02 LAB — COMPREHENSIVE METABOLIC PANEL
ALT: 8 U/L (ref 0–44)
AST: 12 U/L — ABNORMAL LOW (ref 15–41)
Albumin: 3.8 g/dL (ref 3.5–5.0)
Alkaline Phosphatase: 125 U/L (ref 38–126)
Anion gap: 16 — ABNORMAL HIGH (ref 5–15)
BUN: 31 mg/dL — ABNORMAL HIGH (ref 6–20)
CO2: 23 mmol/L (ref 22–32)
Calcium: 9.1 mg/dL (ref 8.9–10.3)
Chloride: 95 mmol/L — ABNORMAL LOW (ref 98–111)
Creatinine, Ser: 2.28 mg/dL — ABNORMAL HIGH (ref 0.61–1.24)
GFR calc Af Amer: 39 mL/min — ABNORMAL LOW (ref >60)
GFR calc non Af Amer: 34 mL/min — ABNORMAL LOW (ref >60)
Glucose, Bld: 331 mg/dL — ABNORMAL HIGH (ref 70–99)
Potassium: 4.3 mmol/L (ref 3.5–5.1)
Sodium: 134 mmol/L — ABNORMAL LOW (ref 135–145)
Total Bilirubin: 1.2 mg/dL (ref 0.3–1.2)
Total Protein: 8.6 g/dL — ABNORMAL HIGH (ref 6.5–8.1)

## 2019-02-02 LAB — ETHANOL: Alcohol, Ethyl (B): <10 mg/dL (ref <10)

## 2019-02-02 LAB — LACTIC ACID, PLASMA
Lactic Acid, Venous: 1.3 mmol/L (ref 0.5–1.9)
Lactic Acid, Venous: 1.5 mmol/L (ref 0.5–1.9)

## 2019-02-02 LAB — LIPASE, BLOOD: Lipase: 106 U/L — ABNORMAL HIGH (ref 11–51)

## 2019-02-02 LAB — TROPONIN I: Troponin I: <0.03 ng/mL (ref <0.03)

## 2019-02-02 MED ORDER — HEPARIN SODIUM (PORCINE) 5000 UNIT/ML IJ SOLN
5000.0000 [IU] | Freq: Three times a day (TID) | INTRAMUSCULAR | Status: DC
  Administered 2019-02-02 – 2019-02-04 (×3): 5000 [IU] via SUBCUTANEOUS
  Filled 2019-02-02 (×4): qty 1

## 2019-02-02 MED ORDER — ASPIRIN EC 81 MG PO TBEC
81.0000 mg | DELAYED_RELEASE_TABLET | Freq: Every day | ORAL | Status: DC
  Administered 2019-02-02 – 2019-02-04 (×2): 81 mg via ORAL
  Filled 2019-02-02 (×2): qty 1

## 2019-02-02 MED ORDER — ACETAMINOPHEN 325 MG PO TABS: 650.0000 mg | ORAL_TABLET | Freq: Four times a day (QID) | ORAL | Status: DC | PRN

## 2019-02-02 MED ORDER — ONDANSETRON HCL 4 MG/2ML IJ SOLN: 4.0000 mg | Freq: Four times a day (QID) | INTRAMUSCULAR | Status: DC | PRN

## 2019-02-02 MED ORDER — INSULIN ASPART 100 UNIT/ML ~~LOC~~ SOLN
10.0000 [IU] | Freq: Once | SUBCUTANEOUS | Status: AC
  Administered 2019-02-02: 10 [IU] via INTRAVENOUS
  Filled 2019-02-02: qty 1

## 2019-02-02 MED ORDER — ONDANSETRON HCL 4 MG PO TABS: 4.0000 mg | ORAL_TABLET | Freq: Four times a day (QID) | ORAL | Status: DC | PRN

## 2019-02-02 MED ORDER — SODIUM CHLORIDE 0.9 % IV BOLUS
1000.0000 mL | Freq: Once | INTRAVENOUS | Status: AC
  Administered 2019-02-02: 1000 mL via INTRAVENOUS

## 2019-02-02 MED ORDER — ALBUTEROL SULFATE (2.5 MG/3ML) 0.083% IN NEBU: 2.5000 mg | INHALATION_SOLUTION | RESPIRATORY_TRACT | Status: DC | PRN

## 2019-02-02 MED ORDER — ACETAMINOPHEN 650 MG RE SUPP: 650.0000 mg | Freq: Four times a day (QID) | RECTAL | Status: DC | PRN

## 2019-02-02 MED ORDER — INSULIN ASPART 100 UNIT/ML ~~LOC~~ SOLN
0.0000 [IU] | Freq: Three times a day (TID) | SUBCUTANEOUS | Status: DC
  Administered 2019-02-03: 2 [IU] via SUBCUTANEOUS
  Administered 2019-02-03 – 2019-02-04 (×2): 3 [IU] via SUBCUTANEOUS
  Filled 2019-02-02 (×3): qty 1

## 2019-02-02 MED ORDER — FLUOXETINE HCL 20 MG PO CAPS
20.0000 mg | ORAL_CAPSULE | Freq: Every day | ORAL | Status: DC
  Administered 2019-02-04: 09:00:00 20 mg via ORAL
  Filled 2019-02-02 (×3): qty 1

## 2019-02-02 MED ORDER — POLYETHYLENE GLYCOL 3350 17 G PO PACK: 17.0000 g | PACK | Freq: Every day | ORAL | Status: DC | PRN

## 2019-02-02 MED ORDER — INSULIN ASPART 100 UNIT/ML ~~LOC~~ SOLN: 0.0000 [IU] | Freq: Every day | SUBCUTANEOUS | Status: DC

## 2019-02-02 MED ORDER — INSULIN ASPART PROT & ASPART (70-30 MIX) 100 UNIT/ML ~~LOC~~ SUSP
18.0000 [IU] | Freq: Two times a day (BID) | SUBCUTANEOUS | Status: DC
  Administered 2019-02-02 – 2019-02-04 (×4): 18 [IU] via SUBCUTANEOUS
  Filled 2019-02-02 (×4): qty 10

## 2019-02-02 MED ORDER — SODIUM CHLORIDE 0.9% FLUSH: 3.0000 mL | Freq: Once | INTRAVENOUS | Status: DC

## 2019-02-02 MED ORDER — SODIUM CHLORIDE 0.9 % IV SOLN
INTRAVENOUS | Status: DC
  Administered 2019-02-02 – 2019-02-04 (×4): via INTRAVENOUS

## 2019-02-02 NOTE — H&P (Signed)
[facility] NAME: Gregory Moody    MR#:  [id number]  DATE OF BIRTH:  01/18/1976  DATE OF ADMISSION:  02/02/2019  PRIMARY CARE PHYSICIAN: [place]   REQUESTING/REFERRING PHYSICIAN: Dr. Burlene Arnt  CHIEF COMPLAINT:   Chief Complaint  Patient presents with  . Emesis    HISTORY OF PRESENT ILLNESS:  Gregory Moody  is a 43 y.o. male with a known history of insulin-dependent diabetes mellitus, CKD stage III presents to the emergency room again in 2 weeks for vomiting and uncontrolled diabetes mellitus.  Patient tells me that he feels like his food gets stuck in his chest and then he starts vomiting.  Same thing happens with liquid to.  He is scared of taking his insulin.  Has had uncontrolled blood sugars with highs and lows.  Here patient has been found to have acute kidney injury with dehydration and uncontrolled blood sugars.  No DKA.  Being admitted for further work-up. Patient quit smoking and alcohol 8 months back.  Never had an EGD.  PAST MEDICAL HISTORY:   Past Medical History:  Diagnosis Date  . Diabetes mellitus without complication ([place])   . Gastroparesis   . Tuberculosis     PAST SURGICAL HISTORY:   Past Surgical History:  Procedure Laterality Date  . NO PAST SURGERIES      SOCIAL HISTORY:   Social History   Tobacco Use  . Smoking status: Never Smoker  . Smokeless tobacco: Never Used  Substance Use Topics  . Alcohol use: No    FAMILY HISTORY:   Family History  Problem Relation Age of Onset  . Diabetes Mother   . Diabetes Father     DRUG ALLERGIES:  No Known Allergies  REVIEW OF SYSTEMS:   Review of Systems  Constitutional: Positive for malaise/fatigue. Negative for chills and fever.  HENT: Negative for sore throat.   Eyes: Negative for blurred vision, double vision and pain.  Respiratory: Negative for cough, hemoptysis, shortness of breath and wheezing.    Cardiovascular: Negative for chest pain, palpitations, orthopnea and leg swelling.  Gastrointestinal: Positive for nausea and vomiting. Negative for abdominal pain, constipation, diarrhea and heartburn.  Genitourinary: Negative for dysuria and hematuria.  Musculoskeletal: Negative for back pain and joint pain.  Skin: Negative for rash.  Neurological: Negative for sensory change, speech change, focal weakness and headaches.  Endo/Heme/Allergies: Does not bruise/bleed easily.  Psychiatric/Behavioral: Negative for depression. The patient is not nervous/anxious.     MEDICATIONS AT HOME:   Prior to Admission medications   Medication Sig Start Date End Date Taking? Authorizing Provider  aspirin EC 81 MG tablet Take 81 mg by mouth daily.    [provider]  FLUoxetine (PROZAC) 20 MG capsule Take 20 mg by mouth daily.    [provider]  insulin NPH-regular Human (70-30) 100 UNIT/ML injection 25 units with breakfast in am and 35 units with supper 01/04/19   Demetrios Loll, MD  insulin NPH-regular Human (NOVOLIN 70/30) (70-30) 100 UNIT/ML injection Inject 55 Units into the skin 2 (two) times daily before a meal. 02/24/18 03/27/18  Bettey Costa, MD  ranitidine (ZANTAC) 150 MG tablet Take 150 mg by mouth 2 (two) times daily.    [provider]     VITAL SIGNS:  Blood pressure 128/81, pulse 78, temperature 98.1 F (36.7 C), temperature source Oral, resp. rate 18, SpO2 100 %.  PHYSICAL EXAMINATION:  Physical Exam  GENERAL:  43 y.o.-year-old  patient lying in the bed with no acute distress.  EYES: Pupils equal, round, reactive to light and accommodation. No scleral icterus. Extraocular muscles intact.  HEENT: Head atraumatic, normocephalic. Oropharynx and nasopharynx clear. No oropharyngeal erythema, dry oral mucosa  NECK:  Supple, no jugular venous distention. No thyroid enlargement, no tenderness.  LUNGS: Normal breath sounds bilaterally, no wheezing, rales, rhonchi. No use of  accessory muscles of respiration.  CARDIOVASCULAR: S1, S2 normal. No murmurs, rubs, or gallops.  ABDOMEN: Soft, nontender, nondistended. Bowel sounds present. No organomegaly or mass.  EXTREMITIES: No pedal edema, cyanosis, or clubbing. + 2 pedal & radial pulses b/l.   NEUROLOGIC: Cranial nerves II through XII are intact. No focal Motor or sensory deficits appreciated b/l PSYCHIATRIC: The patient is alert and oriented x 3. Good affect.  SKIN: No obvious rash, lesion, or ulcer.   LABORATORY PANEL:   CBC Recent Labs  Lab 02/02/19 1514  WBC 8.9  HGB 11.3*  HCT 34.9*  PLT 401*   ------------------------------------------------------------------------------------------------------------------  Chemistries  Recent Labs  Lab 02/02/19 1514  NA 134*  K 4.3  CL 95*  CO2 23  GLUCOSE 331*  BUN 31*  CREATININE 2.28*  CALCIUM 9.1  AST 12*  ALT 8  ALKPHOS 125  BILITOT 1.2   ------------------------------------------------------------------------------------------------------------------  Cardiac Enzymes Recent Labs  Lab 02/02/19 1514  TROPONINI <0.03   ------------------------------------------------------------------------------------------------------------------  RADIOLOGY:  No results found.   IMPRESSION AND PLAN:   *Acute kidney injury secondary to dehydration from vomiting and poor oral intake.  Will start IV fluids.  Monitor input and output.  Repeat labs in the morning.  *Uncontrolled diabetes mellitus as patient has not been taking his insulin regularly due to vomiting.  Will restart home dose of insulin at lower dose as patient is on liquids.  Sliding scale insulin ordered.  Check hemoglobin A1c.  *Dysphagia.  Consulted Dr. Alice Reichert of GI and sent message.  Will need EGD. Started liquid diet.  N.p.o. after midnight.  *Anemia of chronic disease is stable  DVT prophylaxis with Lovenox  All the records are reviewed and case discussed with ED  provider. Management plans discussed with the patient, family and they are in agreement.  CODE STATUS: Full code  TOTAL TIME TAKING CARE OF THIS PATIENT: 40 minutes.   [name] M.D on 02/02/2019 at 5:32 PM  Between 7am to 6pm - Pager - [phone]  After 6pm go to [email or web address] - password [email or web address] [facility]  Office  [phone]  CC: Primary care physician; [place]  Note: This dictation was prepared with Dragon dictation along with smaller Company secretary. Any transcriptional errors that result from this process are unintentional.

## 2019-02-02 NOTE — ED Provider Notes (Signed)
[facility] Emergency Department Provider Note  ____________________________________________   I have reviewed the triage vital signs and the nursing notes. Where available I have reviewed prior notes and, if possible and indicated, outside hospital notes.    HISTORY  Chief Complaint Emesis    HPI Gregory Moody is a 43 y.o. male  with a history of acute kidney injury in the past from diabetes gastric paresis poor compliance history of mild DKA in the past presents today complaining of his gastroparesis.  He states that it is "acting up".  He states that he has been having difficulty holding food down although he is not vomited till this morning, no chest pain or shortness of breath, he has some mild diarrhea for years but nothing recent, no vomiting till this afternoon a few hours prior to coming in.  He has had no chest pain no shortness of breath however because he was feeling his gastroparesis type, he stopped taking his insulin 3 days ago.  He feels dehydrated is not really eating or drinking very much.  Denies any focal abdominal pain.  He denies any melena or bright red blood per rectum or hematemesis.  He denies any exertional symptoms or headache.  He denies any fever, he denies any foot ulcer, he denies any dysuria or urinary frequency     Past Medical History:  Diagnosis Date  . Diabetes mellitus without complication ([place])   . Gastroparesis   . Tuberculosis     Patient Active Problem List   Diagnosis Date Noted  . ARF (acute renal failure) ([place]) 01/03/2019  . Malnutrition of moderate degree 02/24/2018  . GERD (gastroesophageal reflux disease) 02/23/2018  . Diabetic gastroparesis ([place]) 02/23/2018  . Diabetic foot ulcer ([place]) 02/23/2018  . Aspiration pneumonia ([place]) 04/21/2017  . Hypoglycemia 04/21/2017  . Diabetes mellitus with hyperglycemia ([place]) 04/21/2017  . Hip fracture, unspecified laterality, closed, initial encounter ([place])  04/17/2017  . Hyperglycemia 04/17/2017  . Protein-calorie malnutrition, severe 04/09/2016  . Cavitary lesion of lung 04/06/2016    Past Surgical History:  Procedure Laterality Date  . NO PAST SURGERIES      Prior to Admission medications   Medication Sig Start Date End Date Taking? Authorizing Provider  aspirin EC 81 MG tablet Take 81 mg by mouth daily.    [provider]  FLUoxetine (PROZAC) 20 MG capsule Take 20 mg by mouth daily.    [provider]  insulin NPH-regular Human (70-30) 100 UNIT/ML injection 25 units with breakfast in am and 35 units with supper 01/04/19   Demetrios Loll, MD  insulin NPH-regular Human (NOVOLIN 70/30) (70-30) 100 UNIT/ML injection Inject 55 Units into the skin 2 (two) times daily before a meal. 02/24/18 03/27/18  Bettey Costa, MD  ranitidine (ZANTAC) 150 MG tablet Take 150 mg by mouth 2 (two) times daily.    [provider]    Allergies Patient has no known allergies.  Family History  Problem Relation Age of Onset  . Diabetes Mother   . Diabetes Father     Social History Social History   Tobacco Use  . Smoking status: Never Smoker  . Smokeless tobacco: Never Used  Substance Use Topics  . Alcohol use: No  . Drug use: No    Review of Systems Constitutional: No fever/chills Eyes: No visual changes. ENT: No sore throat. No stiff neck no neck pain Cardiovascular: Denies chest pain. Respiratory: Denies shortness of breath. Gastrointestinal:   _+ vomiting.  + diarrhea.  No  constipation. Genitourinary: Negative for dysuria. Musculoskeletal: Negative lower extremity swelling Skin: Negative for rash. Neurological: Negative for severe headaches, focal weakness or numbness.   ____________________________________________   PHYSICAL EXAM:  VITAL SIGNS: ED Triage Vitals  Enc Vitals Group     BP 02/02/19 1505 (!) 71/51     Pulse Rate 02/02/19 1505 75     Resp 02/02/19 1505 16     Temp 02/02/19 1505 98.1 F (36.7 C)      Temp Source 02/02/19 1505 Oral     SpO2 02/02/19 1505 100 %     Weight --      Height --      Head Circumference --      Peak Flow --      Pain Score 02/02/19 1508 0     Pain Loc --      Pain Edu? --      Excl. in [place]? --     Constitutional: Alert and oriented. Well appearing and in no acute distress. Eyes: Conjunctivae are normal Head: Atraumatic HEENT: No congestion/rhinnorhea. Mucous membranes are dry.  Oropharynx non-erythematous Neck:   Nontender with no meningismus, no masses, no stridor Cardiovascular: Normal rate, regular rhythm. Grossly normal heart sounds.  Good peripheral circulation. Respiratory: Normal respiratory effort.  No retractions. Lungs CTAB. Abdominal: Soft and nontender. No distention. No guarding no rebound Back:  There is no focal tenderness or step off.  there is no midline tenderness there are no lesions noted. there is no CVA tenderness Musculoskeletal: No lower extremity tenderness, no upper extremity tenderness. No joint effusions, no DVT signs strong distal pulses no edema Neurologic:  Normal speech and language. No gross focal neurologic deficits are appreciated.  Skin:  Skin is warm, dry and intact. No rash noted. Psychiatric: Mood and affect are normal. Speech and behavior are normal.  ____________________________________________   LABS (all labs ordered are listed, but only abnormal results are displayed)  Labs Reviewed  LIPASE, BLOOD - Abnormal; Notable for the following components:      Result Value   Lipase 106 (*)    All other components within normal limits  COMPREHENSIVE METABOLIC PANEL - Abnormal; Notable for the following components:   Sodium 134 (*)    Chloride 95 (*)    Glucose, Bld 331 (*)    BUN 31 (*)    Creatinine, Ser 2.28 (*)    Total Protein 8.6 (*)    AST 12 (*)    GFR calc non Af Amer 34 (*)    GFR calc Af Amer 39 (*)    Anion gap 16 (*)    All other components within normal limits  CBC - Abnormal; Notable for the  following components:   RBC 3.83 (*)    Hemoglobin 11.3 (*)    HCT 34.9 (*)    Platelets 401 (*)    All other components within normal limits  GLUCOSE, CAPILLARY - Abnormal; Notable for the following components:   Glucose-Capillary 297 (*)    All other components within normal limits  BLOOD GAS, VENOUS - Abnormal; Notable for the following components:   Acid-base deficit 2.8 (*)    All other components within normal limits  ETHANOL  URINALYSIS, COMPLETE (UACMP) WITH MICROSCOPIC  URINE DRUG SCREEN, QUALITATIVE ([facility] ONLY)  CBG MONITORING, ED  CBG MONITORING, ED  CBG MONITORING, ED  CBG MONITORING, ED  CBG MONITORING, ED  CBG MONITORING, ED  CBG MONITORING, ED  CBG MONITORING, ED  CBG MONITORING, ED  CBG  MONITORING, ED  CBG MONITORING, ED  CBG MONITORING, ED  CBG MONITORING, ED    Pertinent labs  results that were available during my care of the patient were reviewed by me and considered in my medical decision making (see chart for details). ____________________________________________  EKG  I personally interpreted any EKGs ordered by me or triage  ____________________________________________  RADIOLOGY  Pertinent labs & imaging results that were available during my care of the patient were reviewed by me and considered in my medical decision making (see chart for details). If possible, patient and/or family made aware of any abnormal findings.  No results found. ____________________________________________    PROCEDURES  Procedure(s) performed: None  Procedures  Critical Care performed: CRITICAL CARE Performed by: Schuyler Amor   Total critical care time: 38 minutes  Critical care time was exclusive of separately billable procedures and treating other patients.  Critical care was necessary to treat or prevent imminent or life-threatening deterioration.  Critical care was time spent personally by me on the following activities: development of treatment  plan with patient and/or surrogate as well as nursing, discussions with consultants, evaluation of patient's response to treatment, examination of patient, obtaining history from patient or surrogate, ordering and performing treatments and interventions, ordering and review of laboratory studies, ordering and review of radiographic studies, pulse oximetry and re-evaluation of patient's condition.   ____________________________________________   INITIAL IMPRESSION / ASSESSMENT AND PLAN / ED COURSE  Pertinent labs & imaging results that were available during my care of the patient were reviewed by me and considered in my medical decision making (see chart for details).  Patient was initially hypotensive, very rapidly improved his blood pressure with IV fluids before half a bag was in his pressures had normalized, no evidence of sepsis, more likely dehydration and acute kidney injury, patient has had this multiple times before but admitted for the same constellation of symptoms.  He is afebrile here.  His blood work is reassuring, does not appear to have any significant DKA although his gap is mildly elevated we will give him insulin but I do not think an insulin drip is indicated. We will however obtain cultures, I will forego antibiotics at this time pending urinalysis as again seems to be more of a gastroparesis problem.  His abdomen is non-surgical at this time and I think imaging is warranted.  Given his acute kidney injury is dehydration his initial hypotension is poor compliance we will admit the patient for further hydration and evaluation   ____________________________________________   FINAL CLINICAL IMPRESSION(S) / ED DIAGNOSES  Final diagnoses:  Diabetic ketoacidosis without coma associated with other specified diabetes mellitus ([place])  AKI (acute kidney injury) ([place])  Hypotension, unspecified hypotension type      This chart was dictated using voice recognition software.  Despite  best efforts to proofread,  errors can occur which can change meaning.      Schuyler Amor, MD 02/02/19 1622

## 2019-02-02 NOTE — ED Notes (Signed)
Requested patient to give urine sample and provided urinal.

## 2019-02-02 NOTE — ED Triage Notes (Signed)
Pt states that for the past week he has been unable to keep food down. Pt states that when he eats, he feels like the food gets stuck and then he starts vomiting. Pt states that he is weak and feels dizzy. Pt is hypotensive in triage.

## 2019-02-02 NOTE — ED Notes (Signed)
ED TO INPATIENT HANDOFF REPORT  ED Nurse Name and Phone #: Shalisa Mcquade [phone]  S Name/Age/Gender Gregory Moody 43 y.o. male Room/Bed: ED13A/ED13A  Code Status   Code Status: Full Code  Home/SNF/Other home Patient oriented x 4 Is this baseline? yes  Triage Complete: Triage complete  Chief Complaint vomiting and w eakness  Triage Note Pt states that for the past week he has been unable to keep food down. Pt states that when he eats, he feels like the food gets stuck and then he starts vomiting. Pt states that he is weak and feels dizzy. Pt is hypotensive in triage.    Allergies No Known Allergies  Level of Care/Admitting Diagnosis ED Disposition    ED Disposition Condition [facility]: [place] [[id number]]  Level of Care: Med-Surg [16]  Covid Evaluation: N/A  Diagnosis: AKI (acute kidney injury) [facility]) [[id number]]  Admitting Physician: Hillary Bow [[id number]]  Attending Physician: Hillary Bow [[id number]]  Estimated length of stay: past midnight tomorrow  Certification:: I certify this patient will need inpatient services for at least 2 midnights  PT Class (Do Not Modify): Inpatient [101]  PT Acc Code (Do Not Modify): Private [1]       B Medical/Surgery History Past Medical History:  Diagnosis Date  . Diabetes mellitus without complication ([place])   . Gastroparesis   . Tuberculosis    Past Surgical History:  Procedure Laterality Date  . NO PAST SURGERIES       A IV Location/Drains/Wounds Patient Lines/Drains/Airways Status   Active Line/Drains/Airways    Name:   Placement date:   Placement time:   Site:   Days:   Peripheral IV 01/03/19 Right Antecubital   01/03/19    1625    Antecubital   30   Peripheral IV 01/03/19 Left Arm   01/03/19    1625    Arm   30   Peripheral IV 02/02/19 Left Antecubital   02/02/19    1541    Antecubital   less than 1   Peripheral IV 02/02/19 Right Antecubital   02/02/19    1659     Antecubital   less than 1   Wound / Incision (Open or Dehisced) 02/23/18 Diabetic ulcer Toe (Comment  which one) Right wound is bleeding but not much   02/23/18    0130    Toe (Comment  which one)   344   Wound / Incision (Open or Dehisced) 02/23/18 Diabetic ulcer Toe (Comment  which one) Left   02/23/18    0130    Toe (Comment  which one)   344          Intake/Output Last 24 hours No intake or output data in the 24 hours ending 02/02/19 1818  Labs/Imaging Results for orders placed or performed during the hospital encounter of 02/02/19 (from the past 48 hour(s))  Glucose, capillary     Status: Abnormal   Collection Time: 02/02/19  3:13 PM  Result Value Ref Range   Glucose-Capillary 297 (H) 70 - 99 mg/dL  Lipase, blood     Status: Abnormal   Collection Time: 02/02/19  3:14 PM  Result Value Ref Range   Lipase 106 (H) 11 - 51 U/L    Comment: Performed at [facility], [address] [place], [place] [address]  Comprehensive metabolic panel     Status: Abnormal   Collection Time: 02/02/19  3:14 PM  Result Value Ref Range   Sodium  134 (L) 135 - 145 mmol/L   Potassium 4.3 3.5 - 5.1 mmol/L   Chloride 95 (L) 98 - 111 mmol/L   CO2 23 22 - 32 mmol/L   Glucose, Bld 331 (H) 70 - 99 mg/dL   BUN 31 (H) 6 - 20 mg/dL   Creatinine, Ser 2.28 (H) 0.61 - 1.24 mg/dL   Calcium 9.1 8.9 - 10.3 mg/dL   Total Protein 8.6 (H) 6.5 - 8.1 g/dL   Albumin 3.8 3.5 - 5.0 g/dL   AST 12 (L) 15 - 41 U/L   ALT 8 0 - 44 U/L   Alkaline Phosphatase 125 38 - 126 U/L   Total Bilirubin 1.2 0.3 - 1.2 mg/dL   GFR calc non Af Amer 34 (L) >60 mL/min   GFR calc Af Amer 39 (L) >60 mL/min   Anion gap 16 (H) 5 - 15    Comment: Performed at [facility], [address] [place], [place] [address]  CBC     Status: Abnormal   Collection Time: 02/02/19  3:14 PM  Result Value Ref Range   WBC 8.9 4.0 - 10.5 K/uL   RBC 3.83 (L) 4.22 - 5.81 MIL/uL   Hemoglobin 11.3 (L) 13.0 - 17.0 g/dL   HCT 34.9 (L) 39.0  - 52.0 %   MCV 91.1 80.0 - 100.0 fL   MCH 29.5 26.0 - 34.0 pg   MCHC 32.4 30.0 - 36.0 g/dL   RDW 12.3 11.5 - 15.5 %   Platelets 401 (H) 150 - 400 K/uL   nRBC 0.0 0.0 - 0.2 %    Comment: Performed at [facility], [address] [place], [place] [address]  Ethanol     Status: None   Collection Time: 02/02/19  3:14 PM  Result Value Ref Range   Alcohol, Ethyl (B) <10 <10 mg/dL    Comment: (NOTE) Lowest detectable limit for serum alcohol is 10 mg/dL. For medical purposes only. Performed at [facility], [address] [place], [place] [address]   Troponin I - Add-On to previous collection     Status: None   Collection Time: 02/02/19  3:14 PM  Result Value Ref Range   Troponin I <0.03 <0.03 ng/mL    Comment: Performed at [facility], [address] [place], [place] [address]  Blood gas, venous     Status: Abnormal (Preliminary result)   Collection Time: 02/02/19  3:38 PM  Result Value Ref Range   FIO2 0.21    Delivery systems ROOM AIR    pH, Ven 7.30 7.250 - 7.430   pCO2, Ven 49 44.0 - 60.0 mmHg   pO2, Ven PENDING 32.0 - 45.0 mmHg   Bicarbonate 24.1 20.0 - 28.0 mmol/L   Acid-base deficit 2.8 (H) 0.0 - 2.0 mmol/L   O2 Saturation 42.5 %   Patient temperature 37.0    Collection site VENOUS    Sample type VENOUS     Comment: Performed at [facility], [address] [place], [place] [address]  Lactic acid, plasma     Status: None   Collection Time: 02/02/19  4:34 PM  Result Value Ref Range   Lactic Acid, Venous 1.3 0.5 - 1.9 mmol/L    Comment: Performed at [facility], [address] [place], [place] [address]  Glucose, capillary     Status: Abnormal   Collection Time: 02/02/19  4:58 PM  Result Value Ref Range   Glucose-Capillary 249 (H) 70 - 99 mg/dL  No results found.  Pending Labs Unresulted Labs (From admission, onward)    Start     Ordered   02/03/19 2707  Basic metabolic panel  Tomorrow morning,   STAT      02/02/19 1729   02/03/19 0500  CBC  Tomorrow morning,   STAT     02/02/19 1729   02/03/19 0500  Lipase, blood  Tomorrow morning,   STAT     02/02/19 1730   02/02/19 1728  Hemoglobin A1c  Add-on,   AD    Comments:  To assess prior glycemic control    02/02/19 1729   02/02/19 1622  Lactic acid, plasma  Now then every 2 hours,   STAT     02/02/19 1621   02/02/19 1622  Urine culture  Once,   STAT     02/02/19 1621   02/02/19 1622  Culture, blood (routine x 2)  BLOOD CULTURE X 2,   STAT     02/02/19 1621   02/02/19 1525  Urine Drug Screen, Qualitative  Once,   STAT     02/02/19 1524   02/02/19 1510  Urinalysis, Complete w Microscopic  ONCE - STAT,   STAT     02/02/19 1509          Vitals/Pain Today's Vitals   02/02/19 1508 02/02/19 1531 02/02/19 1630 02/02/19 1800  BP:  113/90 128/81 108/74  Pulse:  73 78   Resp:  18 18 11   Temp:      TempSrc:      SpO2:  100%    PainSc: 0-No pain  0-No pain     Isolation Precautions No active isolations  Medications Medications  sodium chloride flush (NS) 0.9 % injection 3 mL (has no administration in time range)  insulin aspart (novoLOG) injection 0-15 Units (has no administration in time range)  insulin aspart (novoLOG) injection 0-5 Units (has no administration in time range)  heparin injection 5,000 Units (has no administration in time range)  acetaminophen (TYLENOL) tablet 650 mg (has no administration in time range)    Or  acetaminophen (TYLENOL) suppository 650 mg (has no administration in time range)  polyethylene glycol (MIRALAX / GLYCOLAX) packet 17 g (has no administration in time range)  ondansetron (ZOFRAN) tablet 4 mg (has no administration in time range)    Or  ondansetron (ZOFRAN) injection 4 mg (has no administration in time range)  albuterol (PROVENTIL) (2.5 MG/3ML) 0.083% nebulizer solution 2.5 mg (has no administration in time range)  0.9 %  sodium chloride infusion (has no administration in time range)  aspirin  EC tablet 81 mg (has no administration in time range)  FLUoxetine (PROZAC) capsule 20 mg (has no administration in time range)  insulin aspart protamine- aspart (NOVOLOG MIX 70/30) injection 18 Units (has no administration in time range)  sodium chloride 0.9 % bolus 1,000 mL (0 mLs Intravenous Stopped 02/02/19 1635)  sodium chloride 0.9 % bolus 1,000 mL (0 mLs Intravenous Stopped 02/02/19 1635)  insulin aspart (novoLOG) injection 10 Units (10 Units Intravenous Given 02/02/19 1635)    Mobility Low fall risk   Focused Assessments    R   Report given to:   Additional Notes: spanish speaking

## 2019-02-03 ENCOUNTER — Inpatient Hospital Stay: Payer: Self-pay | Admitting: Anesthesiology

## 2019-02-03 ENCOUNTER — Encounter
Admission: EM | Disposition: A | Discharge status: Home / Self Care | Payer: Self-pay | Source: Home / Self Care | Attending: Internal Medicine

## 2019-02-03 ENCOUNTER — Encounter: Payer: Self-pay | Admitting: Anesthesiology

## 2019-02-03 HISTORY — PX: ESOPHAGOGASTRODUODENOSCOPY: SHX5428

## 2019-02-03 LAB — BASIC METABOLIC PANEL
Anion gap: 12 (ref 5–15)
BUN: 24 mg/dL — ABNORMAL HIGH (ref 6–20)
CO2: 20 mmol/L — ABNORMAL LOW (ref 22–32)
Calcium: 8.3 mg/dL — ABNORMAL LOW (ref 8.9–10.3)
Chloride: 104 mmol/L (ref 98–111)
Creatinine, Ser: 1.5 mg/dL — ABNORMAL HIGH (ref 0.61–1.24)
GFR calc Af Amer: >60 mL/min (ref >60)
GFR calc non Af Amer: 56 mL/min — ABNORMAL LOW (ref >60)
Glucose, Bld: 142 mg/dL — ABNORMAL HIGH (ref 70–99)
Potassium: 3.5 mmol/L (ref 3.5–5.1)
Sodium: 136 mmol/L (ref 135–145)

## 2019-02-03 LAB — CBC
HCT: 28.4 % — ABNORMAL LOW (ref 39.0–52.0)
Hemoglobin: 9.2 g/dL — ABNORMAL LOW (ref 13.0–17.0)
MCH: 29.5 pg (ref 26.0–34.0)
MCHC: 32.4 g/dL (ref 30.0–36.0)
MCV: 91 fL (ref 80.0–100.0)
Platelets: 337 10*3/uL (ref 150–400)
RBC: 3.12 MIL/uL — ABNORMAL LOW (ref 4.22–5.81)
RDW: 12.2 % (ref 11.5–15.5)
WBC: 6.6 10*3/uL (ref 4.0–10.5)
nRBC: 0 % (ref 0.0–0.2)

## 2019-02-03 LAB — GLUCOSE, CAPILLARY
Glucose-Capillary: 140 mg/dL — ABNORMAL HIGH (ref 70–99)
Glucose-Capillary: 88 mg/dL (ref 70–99)
Glucose-Capillary: 153 mg/dL — ABNORMAL HIGH (ref 70–99)
Glucose-Capillary: 176 mg/dL — ABNORMAL HIGH (ref 70–99)

## 2019-02-03 LAB — LIPASE, BLOOD: Lipase: 59 U/L — ABNORMAL HIGH (ref 11–51)

## 2019-02-03 SURGERY — EGD (ESOPHAGOGASTRODUODENOSCOPY)
Anesthesia: General

## 2019-02-03 MED ORDER — EPHEDRINE SULFATE 50 MG/ML IJ SOLN
INTRAMUSCULAR | Status: DC | PRN
  Administered 2019-02-03: 10 mg via INTRAVENOUS

## 2019-02-03 MED ORDER — PROPOFOL 500 MG/50ML IV EMUL
INTRAVENOUS | Status: AC
  Filled 2019-02-03: qty 50

## 2019-02-03 MED ORDER — ONDANSETRON HCL 4 MG/2ML IJ SOLN: 4.0000 mg | Freq: Once | INTRAMUSCULAR | Status: DC | PRN

## 2019-02-03 MED ORDER — GLYCOPYRROLATE 0.2 MG/ML IJ SOLN
INTRAMUSCULAR | Status: AC
  Filled 2019-02-03: qty 1

## 2019-02-03 MED ORDER — LIDOCAINE HCL (CARDIAC) PF 100 MG/5ML IV SOSY
PREFILLED_SYRINGE | INTRAVENOUS | Status: DC | PRN
  Administered 2019-02-03: 100 mg via INTRAVENOUS

## 2019-02-03 MED ORDER — LIDOCAINE HCL (PF) 2 % IJ SOLN
INTRAMUSCULAR | Status: AC
  Filled 2019-02-03: qty 10

## 2019-02-03 MED ORDER — PROPOFOL 10 MG/ML IV BOLUS
INTRAVENOUS | Status: DC | PRN
  Administered 2019-02-03: 120 mg via INTRAVENOUS

## 2019-02-03 MED ORDER — FENTANYL CITRATE (PF) 100 MCG/2ML IJ SOLN: 25.0000 ug | INTRAMUSCULAR | Status: DC | PRN

## 2019-02-03 MED ORDER — FENTANYL CITRATE (PF) 100 MCG/2ML IJ SOLN
INTRAMUSCULAR | Status: AC
  Filled 2019-02-03: qty 2

## 2019-02-03 MED ORDER — FENTANYL CITRATE (PF) 100 MCG/2ML IJ SOLN
INTRAMUSCULAR | Status: DC | PRN
  Administered 2019-02-03: 50 ug via INTRAVENOUS

## 2019-02-03 MED ORDER — SUCCINYLCHOLINE CHLORIDE 20 MG/ML IJ SOLN
INTRAMUSCULAR | Status: DC | PRN
  Administered 2019-02-03: 80 mg via INTRAVENOUS

## 2019-02-03 MED ORDER — ONDANSETRON HCL 4 MG/2ML IJ SOLN
INTRAMUSCULAR | Status: DC | PRN
  Administered 2019-02-03: 4 mg via INTRAVENOUS

## 2019-02-03 NOTE — Interval H&P Note (Signed)
History and Physical Interval Note:  02/03/2019 12:44 PM  Gregory Moody  has presented today for surgery, with the diagnosis of dysphagia, vomiting.  The various methods of treatment have been discussed with the patient and family. After consideration of risks, benefits and other options for treatment, the patient has consented to  Procedure(s): ESOPHAGOGASTRODUODENOSCOPY (EGD) (N/A) as a surgical intervention.  The patient's history has been reviewed, patient examined, no change in status, stable for surgery.  I have reviewed the patient's chart and labs.  Questions were answered to the patient's satisfaction.     [place], [place]

## 2019-02-03 NOTE — Op Note (Signed)
[facility] Gastroenterology Patient Name: Gregory Moody Procedure Date: 02/03/2019 1:20 PM MRN: [id number] Account #: [id number] Date of Birth: [birth year] Admit Type: Inpatient Age: 43 Room: [facility] ENDO ROOM 4 Gender: Male Note Status: Finalized Procedure:            Upper GI endoscopy Indications:          Epigastric abdominal pain, Dysphagia, Nausea with                        vomiting, Presumed esophageal foreign body Providers:            Benay Pike. Alice Reichert MD, MD Referring MD:         No Local Md, MD (Referring MD) Medicines:            General Anesthesia Complications:        No immediate complications. Procedure:            Pre-Anesthesia Assessment:                       - The risks and benefits of the procedure and the                        sedation options and risks were discussed with the                        patient. All questions were answered and informed                        consent was obtained. Consent for the procedure was                        obtained through the help of a hospital sanctioned                        [place] interpreter.                       - Patient identification and proposed procedure were                        verified prior to the procedure by the nurse. The                        procedure was verified in the procedure room.                       - ASA Grade Assessment: III - A patient with severe                        systemic disease.                       - After reviewing the risks and benefits, the patient                        was deemed in satisfactory condition to undergo the                        procedure.                       -  The anesthesia plan was to use general anesthesia.                       After obtaining informed consent, the endoscope was                        passed under direct vision. Throughout the procedure,                        the patient's blood pressure, pulse,  and oxygen                        saturations were monitored continuously. The Endoscope                        was introduced through the mouth, and advanced to the                        third part of duodenum. The upper GI endoscopy was                        accomplished without difficulty. The patient tolerated                        the procedure well. Findings:      LA Grade C (one or more mucosal breaks continuous between tops of 2 or       more mucosal folds, less than 75% circumference) esophagitis with no       bleeding was found in the distal esophagus.      There is no endoscopic evidence of stenosis, stricture or foreign body       in the entire esophagus.      A 1 cm hiatal hernia was present.      The examined duodenum was normal.      The exam was otherwise without abnormality. Impression:           - LA Grade C reflux esophagitis.                       - 1 cm hiatal hernia.                       - Normal examined duodenum.                       - The examination was otherwise normal.                       - No specimens collected. Recommendation:       - Return patient to hospital ward for possible                        discharge same day.                       - Diabetic (ADA) diet.                       - Use Protonix (pantoprazole) 40 mg PO daily.                       - The findings  and recommendations were discussed with                        the patient.                       - Discussed with interpreter who relayed findings and                        recommendations to the patient.                       - Return to my office in 1 month. Procedure Code(s):    --- Professional ---                       [id number], Esophagogastroduodenoscopy, flexible, transoral;                        diagnostic, including collection of specimen(s) by                        brushing or washing, when performed (separate procedure) Diagnosis Code(s):    --- Professional ---                        R11.2, Nausea with vomiting, unspecified                       R13.10, Dysphagia, unspecified                       R10.13, Epigastric pain                       K44.9, Diaphragmatic hernia without obstruction or                        gangrene                       K21.0, Gastro-esophageal reflux disease with esophagitis CPT copyright 2019 American Medical Association. All rights reserved. The codes documented in this report are preliminary and upon coder review may  be revised to meet current compliance requirements. Efrain Sella MD, MD 02/03/2019 2:48:33 PM This report has been signed electronically. Number of Addenda: 0 Note Initiated On: 02/03/2019 1:20 PM      [facility]

## 2019-02-03 NOTE — Transfer of Care (Signed)
Immediate Anesthesia Transfer of Care Note  Patient: Gregory Moody  Procedure(s) Performed: ESOPHAGOGASTRODUODENOSCOPY (EGD) (N/A )  Patient Location: PACU  Anesthesia Type:General  Level of Consciousness: sedated  Airway & Oxygen Therapy: Patient connected to face mask oxygen  Post-op Assessment: Post -op Vital signs reviewed and stable  Post vital signs: stable  Last Vitals:  Vitals Value Taken Time  BP 97/53 02/03/2019  2:52 PM  Temp    Pulse 96 02/03/2019  2:52 PM  Resp 13 02/03/2019  2:52 PM  SpO2 100 % 02/03/2019  2:52 PM  Vitals shown include unvalidated device data.  Last Pain:  Vitals:   02/03/19 1346  TempSrc:   PainSc: 0-No pain         Complications: No apparent anesthesia complications

## 2019-02-03 NOTE — Anesthesia Post-op Follow-up Note (Signed)
Anesthesia QCDR form completed.        

## 2019-02-03 NOTE — Anesthesia Procedure Notes (Signed)
Procedure Name: Intubation Date/Time: 02/03/2019 2:30 PM Performed by: Aline Brochure, CRNA Pre-anesthesia Checklist: Patient identified, Emergency Drugs available, Suction available and Patient being monitored Patient Re-evaluated:Patient Re-evaluated prior to induction Oxygen Delivery Method: Circle system utilized Preoxygenation: Pre-oxygenation with 100% oxygen Induction Type: IV induction, Rapid sequence and Cricoid Pressure applied Laryngoscope Size: McGraph and 4 Grade View: Grade I Tube type: Oral Tube size: 7.5 mm Number of attempts: 1 Airway Equipment and Method: Stylet and Video-laryngoscopy Placement Confirmation: ETT inserted through vocal cords under direct vision,  positive ETCO2 and breath sounds checked- equal and bilateral Secured at: 22 cm Tube secured with: Tape Dental Injury: Teeth and Oropharynx as per pre-operative assessment

## 2019-02-03 NOTE — Progress Notes (Signed)
[place] [facility] [place] NAME: Gregory Moody    MR#:  [id number]  DATE OF BIRTH:  Feb 11, 1976  SUBJECTIVE: Patient admitted for nausea, vomiting with dehydration with acute renal failure, getting IV fluids, renal function better but he has dysphagia so he is going for EGD   CHIEF COMPLAINT:   Chief Complaint  Patient presents with  . Emesis   This is feeling better, no abdominal pain. REVIEW OF SYSTEMS:   ROS CONSTITUTIONAL: No fever, fatigue or weakness.  EYES: No blurred or double vision.  EARS, NOSE, AND THROAT: No tinnitus or ear pain.  RESPIRATORY: No cough, shortness of breath, wheezing or hemoptysis.  CARDIOVASCULAR: No chest pain, orthopnea, edema.  GASTROINTESTINAL: Getting stuck in the chest followed by vomiting gENITOURINARY: No dysuria, hematuria.  ENDOCRINE: No polyuria, nocturia,  HEMATOLOGY: No anemia, easy bruising or bleeding SKIN: No rash or lesion. MUSCULOSKELETAL: No joint pain or arthritis.   NEUROLOGIC: No tingling, numbness, weakness.  PSYCHIATRY: No anxiety or depression.   DRUG ALLERGIES:  No Known Allergies  VITALS:  Blood pressure 91/68, pulse 74, temperature 97.8 F (36.6 C), temperature source Oral, resp. rate 16, height 5\' 7"  (1.702 m), weight 60.2 kg, SpO2 99 %.  PHYSICAL EXAMINATION:  GENERAL:  43 y.o.-year-old patient lying in the bed with no acute distress.  EYES: Pupils equal, round, reactive to light and accommodation. No scleral icterus. Extraocular muscles intact.  HEENT: Head atraumatic, normocephalic. Oropharynx and nasopharynx clear.  NECK:  Supple, no jugular venous distention. No thyroid enlargement, no tenderness.  LUNGS: Normal breath sounds bilaterally, no wheezing, rales,rhonchi or crepitation. No use of accessory muscles of respiration.  CARDIOVASCULAR: S1, S2 normal. No murmurs, rubs, or gallops.  ABDOMEN: Soft, nontender, nondistended. Bowel sounds present. No  organomegaly or mass.  EXTREMITIES: No pedal edema, cyanosis, or clubbing.  NEUROLOGIC: Cranial nerves II through XII are intact. Muscle strength 5/5 in all extremities. Sensation intact. Gait not checked.  PSYCHIATRIC: The patient is alert and oriented x 3.  SKIN: No obvious rash, lesion, or ulcer.    LABORATORY PANEL:   CBC Recent Labs  Lab 02/03/19 0454  WBC 6.6  HGB 9.2*  HCT 28.4*  PLT 337   ------------------------------------------------------------------------------------------------------------------  Chemistries  Recent Labs  Lab 02/02/19 1514 02/03/19 0454  NA 134* 136  K 4.3 3.5  CL 95* 104  CO2 23 20*  GLUCOSE 331* 142*  BUN 31* 24*  CREATININE 2.28* 1.50*  CALCIUM 9.1 8.3*  AST 12*  --   ALT 8  --   ALKPHOS 125  --   BILITOT 1.2  --    ------------------------------------------------------------------------------------------------------------------  Cardiac Enzymes Recent Labs  Lab 02/02/19 1514  TROPONINI <0.03   ------------------------------------------------------------------------------------------------------------------  RADIOLOGY:  No results found.  EKG:   Orders placed or performed during the hospital encounter of 02/02/19  . ED EKG  . ED EKG  . EKG 12-Lead  . EKG 12-Lead    ASSESSMENT AND PLAN:  Acute kidney injury secondary to dehydration from vomiting, poor p.o. intake, improved with IV hydration. 2.  Uncontrolled diabetes mellitus because of not eating and not taking insulin.  Continue sliding scale insulin with coverage, c hemoglobin A1c is 8.7. 3.  Dysphagia, patient to have EGD, further treatment as per EGD report.     All the records are reviewed and case discussed with Care Management/Social Workerr. Management plans discussed with the patient, family and they are in agreement.  CODE STATUS: Full code  TOTAL TIME TAKING CARE OF THIS PATIENT: 38 minutes.  More than 50% time spent in counseling, coordination of  care Possible discharge tomorrow or day after depending on clinical condition.  Epifanio Lesches M.D on 02/03/2019 at 11:48 AM  Between 7am to 6pm - Pager - [phone]  After 6pm go to [email or web address] - password [email or web address] [place] [facility]  Office  [phone]  CC: Primary care physician; [place]   Note: This dictation was prepared with Dragon dictation along with smaller phrase technology. Any transcriptional errors that result from this process are unintentional.

## 2019-02-03 NOTE — Progress Notes (Signed)
Report given to Jack RN

## 2019-02-03 NOTE — Anesthesia Preprocedure Evaluation (Signed)
Anesthesia Evaluation  Patient identified by MRN, date of birth, ID band Patient awake    Reviewed: Allergy & Precautions, NPO status , Patient's Chart, lab work & pertinent test results  Airway Mallampati: II       Dental   Pulmonary pneumonia, resolved,    Pulmonary exam normal        Cardiovascular negative cardio ROS Normal cardiovascular exam     Neuro/Psych negative neurological ROS     GI/Hepatic Neg liver ROS, GERD  ,  Endo/Other  diabetes  Renal/GU ARFRenal disease     Musculoskeletal negative musculoskeletal ROS (+)   Abdominal Normal abdominal exam  (+)   Peds negative pediatric ROS (+)  Hematology  (+) anemia ,   Anesthesia Other Findings Past Medical History: No date: Diabetes mellitus without complication (HCC) No date: Gastroparesis No date: Tuberculosis  Reproductive/Obstetrics                             Anesthesia Physical Anesthesia Plan  ASA: III and emergent  Anesthesia Plan: General   Post-op Pain Management:    Induction: Intravenous  PONV Risk Score and Plan:   Airway Management Planned: Nasal Cannula  Additional Equipment:   Intra-op Plan:   Post-operative Plan:   Informed Consent: I have reviewed the patients History and Physical, chart, labs and discussed the procedure including the risks, benefits and alternatives for the proposed anesthesia with the patient or authorized representative who has indicated his/her understanding and acceptance.     Dental advisory given  Plan Discussed with: CRNA and Surgeon  Anesthesia Plan Comments:         Anesthesia Quick Evaluation

## 2019-02-03 NOTE — Brief Op Note (Signed)
Pt requests interpreter during conversations with MDs and for discharge instructions.

## 2019-02-03 NOTE — H&P (View-Only) (Signed)
[facility] GI Inpatient Consult Note   Kathline Magic, M.D.  Reason for Consult: Dysphagia, abdominal pain, vomiting   Attending Requesting Consult: Hillary Bow, M.D.   History of Present Illness: Gregory Moody is a 43 y.o. male admitted with uncontrolled diabetes mellitus, nausea, vomiting and acute kidney injury.  The patient remarks she has been having dysphagia to solids for approximately 1 week.  He was admitted to hospital in the [place] area about 2 months ago and underwent an EGD reportedly with removal of a foreign body, probably food.  He is unaware of the results exactly, however.  The patient had epigastric pain at admission which is improved with IV fluids and improvement in blood sugar.  There is no more nausea.  Patient still feels a sensation of a foreign body in the middle of his sternum.  He reportedly told the emergency room and admitting physician last evening that he had gotten something stuck and had to vomit afterwards.  He claims repeated vomiting has not brought up the food.  He denies any melena or hematochezia.  He does not use NSAIDs or alcohol per his own admission. Patient has a reportedly long history of acid reflux and has taken Nexium as recently as 2 months ago but ran out of samples and did not resume them.  He claims that Nexium improved abdominal pain, reflux and some diarrhea symptoms.   Past Medical History:  Past Medical History:  Diagnosis Date  . Diabetes mellitus without complication ([place])   . Gastroparesis   . Tuberculosis     Problem List: Patient Active Problem List   Diagnosis Date Noted  . AKI (acute kidney injury) ([place]) 02/02/2019  . ARF (acute renal failure) ([place]) 01/03/2019  . Malnutrition of moderate degree 02/24/2018  . GERD (gastroesophageal reflux disease) 02/23/2018  . Diabetic gastroparesis ([place]) 02/23/2018  . Diabetic foot ulcer ([place]) 02/23/2018  . Aspiration pneumonia ([place]) 04/21/2017  .  Hypoglycemia 04/21/2017  . Diabetes mellitus with hyperglycemia ([place]) 04/21/2017  . Hip fracture, unspecified laterality, closed, initial encounter ([place]) 04/17/2017  . Hyperglycemia 04/17/2017  . Protein-calorie malnutrition, severe 04/09/2016  . Cavitary lesion of lung 04/06/2016    Past Surgical History: Past Surgical History:  Procedure Laterality Date  . NO PAST SURGERIES      Allergies: No Known Allergies  Home Medications: Medications Prior to Admission  Medication Sig Dispense Refill Last Dose  . aspirin EC 81 MG tablet Take 81 mg by mouth daily.     [name] FLUoxetine (PROZAC) 20 MG capsule Take 20 mg by mouth daily.     . insulin NPH-regular Human (70-30) 100 UNIT/ML injection 25 units with breakfast in am and 35 units with supper 33 mL 0   . insulin NPH-regular Human (NOVOLIN 70/30) (70-30) 100 UNIT/ML injection Inject 55 Units into the skin 2 (two) times daily before a meal. 33 mL 0   . ranitidine (ZANTAC) 150 MG tablet Take 150 mg by mouth 2 (two) times daily.      Home medication reconciliation was completed with the patient.   Scheduled Inpatient Medications:   . aspirin EC  81 mg Oral Daily  . FLUoxetine  20 mg Oral Daily  . heparin  5,000 Units Subcutaneous Q8H  . insulin aspart  0-15 Units Subcutaneous TID WC  . insulin aspart  0-5 Units Subcutaneous QHS  . insulin aspart protamine- aspart  18 Units Subcutaneous BID WC  . sodium chloride flush  3 mL Intravenous Once  Continuous Inpatient Infusions:   . sodium chloride 125 mL/hr at 02/03/19 0356    PRN Inpatient Medications:  acetaminophen **OR** acetaminophen, albuterol, ondansetron **OR** ondansetron (ZOFRAN) IV, polyethylene glycol  Family History: family history includes Diabetes in his father and mother.   GI Family History: Negative.  Social History:   reports that he has never smoked. He has never used smokeless tobacco. He reports that he does not drink alcohol or use drugs. The patient denies  ETOH, tobacco, or drug use.    Review of Systems: Review of Systems - History obtained from the patient General ROS: positive for  - fatigue negative for - fever or night sweats Psychological ROS: negative for - depression, disorientation or irritability Ophthalmic ROS: negative ENT ROS: negative Allergy and Immunology ROS: negative for - hives Hematological and Lymphatic ROS: negative for - bleeding problems, bruising or jaundice Endocrine ROS: positive for - None negative for - skin changes or temperature intolerance Respiratory ROS: no cough, shortness of breath, or wheezing Cardiovascular ROS: no chest pain or dyspnea on exertion Gastrointestinal ROS: HPI Genito-Urinary ROS: no dysuria, trouble voiding, or hematuria Musculoskeletal ROS: negative Neurological ROS: no TIA or stroke symptoms Dermatological ROS: negative  Physical Examination: BP 91/68 (BP Location: Left Arm)   Pulse 74   Temp 97.8 F (36.6 C) (Oral)   Resp 16   Ht 5\' 7"  (1.702 m)   Wt 60.2 kg   SpO2 99%   BMI 20.79 kg/m  Physical Exam Constitutional:      General: He is not in acute distress.    Appearance: Normal appearance. He is not ill-appearing or diaphoretic.  HENT:     Head: Normocephalic and atraumatic.     Nose: Nose normal.  Eyes:     Conjunctiva/sclera: Conjunctivae normal.     Pupils: Pupils are equal, round, and reactive to light.  Neck:     Musculoskeletal: Normal range of motion.  Cardiovascular:     Rate and Rhythm: Normal rate.     Pulses: Normal pulses.  Pulmonary:     Effort: Pulmonary effort is normal.     Breath sounds: Normal breath sounds.  Abdominal:     General: Abdomen is flat. There is no distension.     Palpations: Abdomen is soft. There is no mass.     Tenderness: There is abdominal tenderness. There is no guarding or rebound.     Hernia: No hernia is present.  Musculoskeletal: Normal range of motion.        General: No swelling or tenderness.  Skin:    General:  Skin is warm and dry.  Neurological:     General: No focal deficit present.     Mental Status: He is alert.  Psychiatric:        Mood and Affect: Mood normal.     Data: Lab Results  Component Value Date   WBC 6.6 02/03/2019   HGB 9.2 (L) 02/03/2019   HCT 28.4 (L) 02/03/2019   MCV 91.0 02/03/2019   PLT 337 02/03/2019   Recent Labs  Lab 02/02/19 1514 02/03/19 0454  HGB 11.3* 9.2*   Lab Results  Component Value Date   NA 136 02/03/2019   K 3.5 02/03/2019   CL 104 02/03/2019   CO2 20 (L) 02/03/2019   BUN 24 (H) 02/03/2019   CREATININE 1.50 (H) 02/03/2019   Lab Results  Component Value Date   ALT 8 02/02/2019   AST 12 (L) 02/02/2019   ALKPHOS 125 02/02/2019  BILITOT 1.2 02/02/2019   No results for input(s): APTT, INR, PTT in the last 168 hours. CBC Latest Ref Rng & Units 02/03/2019 02/02/2019 01/03/2019  WBC 4.0 - 10.5 K/uL 6.6 8.9 11.8(H)  Hemoglobin 13.0 - 17.0 g/dL 9.2(L) 11.3(L) 11.0(L)  Hematocrit 39.0 - 52.0 % 28.4(L) 34.9(L) 32.9(L)  Platelets 150 - 400 K/uL 337 401(H) 300    STUDIES: No results found. @IMAGES @  Assessment:  1. Dysphagia - possible possible esophageal foreign body and history of EGD reportedly for FB removal [place]). 2. GERD - poorly controlled. 3. Abdominal pain - DDx includes gastritis, PUD, H Pylori, secondary to mild acidosis, etc. 4. Uncontrolled DM.  Recommendations: 1. EGD with possible foreign body removal, possible biopsy, possible esophageal dilation. The patient understands the nature of the planned procedure, indications, risks, alternatives and potential complications including but not limited to bleeding, infection, perforation, damage to internal organs and possible oversedation/side effects from anesthesia. The patient agrees and gives consent to proceed.  Please refer to procedure notes for findings, recommendations and patient disposition/instructions. 2. Continue current medications and NPO.   Thank you for the  consult. Please call with questions or concerns.  Olean Ree, "Lanny Hurst MD [facility] Gastroenterology [place], [place] [address] [phone]  02/03/2019 12:31 PM

## 2019-02-03 NOTE — Consult Note (Signed)
[facility] GI Inpatient Consult Note   Kathline Magic, M.D.  Reason for Consult: Dysphagia, abdominal pain, vomiting   Attending Requesting Consult: Hillary Bow, M.D.   History of Present Illness: Gregory Moody is a 43 y.o. male admitted with uncontrolled diabetes mellitus, nausea, vomiting and acute kidney injury.  The patient remarks she has been having dysphagia to solids for approximately 1 week.  He was admitted to hospital in the [place] area about 2 months ago and underwent an EGD reportedly with removal of a foreign body, probably food.  He is unaware of the results exactly, however.  The patient had epigastric pain at admission which is improved with IV fluids and improvement in blood sugar.  There is no more nausea.  Patient still feels a sensation of a foreign body in the middle of his sternum.  He reportedly told the emergency room and admitting physician last evening that he had gotten something stuck and had to vomit afterwards.  He claims repeated vomiting has not brought up the food.  He denies any melena or hematochezia.  He does not use NSAIDs or alcohol per his own admission. Patient has a reportedly long history of acid reflux and has taken Nexium as recently as 2 months ago but ran out of samples and did not resume them.  He claims that Nexium improved abdominal pain, reflux and some diarrhea symptoms.   Past Medical History:  Past Medical History:  Diagnosis Date  . Diabetes mellitus without complication ([place])   . Gastroparesis   . Tuberculosis     Problem List: Patient Active Problem List   Diagnosis Date Noted  . AKI (acute kidney injury) ([place]) 02/02/2019  . ARF (acute renal failure) ([place]) 01/03/2019  . Malnutrition of moderate degree 02/24/2018  . GERD (gastroesophageal reflux disease) 02/23/2018  . Diabetic gastroparesis ([place]) 02/23/2018  . Diabetic foot ulcer ([place]) 02/23/2018  . Aspiration pneumonia ([place]) 04/21/2017  .  Hypoglycemia 04/21/2017  . Diabetes mellitus with hyperglycemia ([place]) 04/21/2017  . Hip fracture, unspecified laterality, closed, initial encounter ([place]) 04/17/2017  . Hyperglycemia 04/17/2017  . Protein-calorie malnutrition, severe 04/09/2016  . Cavitary lesion of lung 04/06/2016    Past Surgical History: Past Surgical History:  Procedure Laterality Date  . NO PAST SURGERIES      Allergies: No Known Allergies  Home Medications: Medications Prior to Admission  Medication Sig Dispense Refill Last Dose  . aspirin EC 81 MG tablet Take 81 mg by mouth daily.     [name] FLUoxetine (PROZAC) 20 MG capsule Take 20 mg by mouth daily.     . insulin NPH-regular Human (70-30) 100 UNIT/ML injection 25 units with breakfast in am and 35 units with supper 33 mL 0   . insulin NPH-regular Human (NOVOLIN 70/30) (70-30) 100 UNIT/ML injection Inject 55 Units into the skin 2 (two) times daily before a meal. 33 mL 0   . ranitidine (ZANTAC) 150 MG tablet Take 150 mg by mouth 2 (two) times daily.      Home medication reconciliation was completed with the patient.   Scheduled Inpatient Medications:   . aspirin EC  81 mg Oral Daily  . FLUoxetine  20 mg Oral Daily  . heparin  5,000 Units Subcutaneous Q8H  . insulin aspart  0-15 Units Subcutaneous TID WC  . insulin aspart  0-5 Units Subcutaneous QHS  . insulin aspart protamine- aspart  18 Units Subcutaneous BID WC  . sodium chloride flush  3 mL Intravenous Once  Continuous Inpatient Infusions:   . sodium chloride 125 mL/hr at 02/03/19 0356    PRN Inpatient Medications:  acetaminophen **OR** acetaminophen, albuterol, ondansetron **OR** ondansetron (ZOFRAN) IV, polyethylene glycol  Family History: family history includes Diabetes in his father and mother.   GI Family History: Negative.  Social History:   reports that he has never smoked. He has never used smokeless tobacco. He reports that he does not drink alcohol or use drugs. The patient denies  ETOH, tobacco, or drug use.    Review of Systems: Review of Systems - History obtained from the patient General ROS: positive for  - fatigue negative for - fever or night sweats Psychological ROS: negative for - depression, disorientation or irritability Ophthalmic ROS: negative ENT ROS: negative Allergy and Immunology ROS: negative for - hives Hematological and Lymphatic ROS: negative for - bleeding problems, bruising or jaundice Endocrine ROS: positive for - None negative for - skin changes or temperature intolerance Respiratory ROS: no cough, shortness of breath, or wheezing Cardiovascular ROS: no chest pain or dyspnea on exertion Gastrointestinal ROS: HPI Genito-Urinary ROS: no dysuria, trouble voiding, or hematuria Musculoskeletal ROS: negative Neurological ROS: no TIA or stroke symptoms Dermatological ROS: negative  Physical Examination: BP 91/68 (BP Location: Left Arm)   Pulse 74   Temp 97.8 F (36.6 C) (Oral)   Resp 16   Ht 5\' 7"  (1.702 m)   Wt 60.2 kg   SpO2 99%   BMI 20.79 kg/m  Physical Exam Constitutional:      General: He is not in acute distress.    Appearance: Normal appearance. He is not ill-appearing or diaphoretic.  HENT:     Head: Normocephalic and atraumatic.     Nose: Nose normal.  Eyes:     Conjunctiva/sclera: Conjunctivae normal.     Pupils: Pupils are equal, round, and reactive to light.  Neck:     Musculoskeletal: Normal range of motion.  Cardiovascular:     Rate and Rhythm: Normal rate.     Pulses: Normal pulses.  Pulmonary:     Effort: Pulmonary effort is normal.     Breath sounds: Normal breath sounds.  Abdominal:     General: Abdomen is flat. There is no distension.     Palpations: Abdomen is soft. There is no mass.     Tenderness: There is abdominal tenderness. There is no guarding or rebound.     Hernia: No hernia is present.  Musculoskeletal: Normal range of motion.        General: No swelling or tenderness.  Skin:    General:  Skin is warm and dry.  Neurological:     General: No focal deficit present.     Mental Status: He is alert.  Psychiatric:        Mood and Affect: Mood normal.     Data: Lab Results  Component Value Date   WBC 6.6 02/03/2019   HGB 9.2 (L) 02/03/2019   HCT 28.4 (L) 02/03/2019   MCV 91.0 02/03/2019   PLT 337 02/03/2019   Recent Labs  Lab 02/02/19 1514 02/03/19 0454  HGB 11.3* 9.2*   Lab Results  Component Value Date   NA 136 02/03/2019   K 3.5 02/03/2019   CL 104 02/03/2019   CO2 20 (L) 02/03/2019   BUN 24 (H) 02/03/2019   CREATININE 1.50 (H) 02/03/2019   Lab Results  Component Value Date   ALT 8 02/02/2019   AST 12 (L) 02/02/2019   ALKPHOS 125 02/02/2019  BILITOT 1.2 02/02/2019   No results for input(s): APTT, INR, PTT in the last 168 hours. CBC Latest Ref Rng & Units 02/03/2019 02/02/2019 01/03/2019  WBC 4.0 - 10.5 K/uL 6.6 8.9 11.8(H)  Hemoglobin 13.0 - 17.0 g/dL 9.2(L) 11.3(L) 11.0(L)  Hematocrit 39.0 - 52.0 % 28.4(L) 34.9(L) 32.9(L)  Platelets 150 - 400 K/uL 337 401(H) 300    STUDIES: No results found. @IMAGES @  Assessment:  1. Dysphagia - possible possible esophageal foreign body and history of EGD reportedly for FB removal [place], [place]). 2. GERD - poorly controlled. 3. Abdominal pain - DDx includes gastritis, PUD, H Pylori, secondary to mild acidosis, etc. 4. Uncontrolled DM.  Recommendations: 1. EGD with possible foreign body removal, possible biopsy, possible esophageal dilation. The patient understands the nature of the planned procedure, indications, risks, alternatives and potential complications including but not limited to bleeding, infection, perforation, damage to internal organs and possible oversedation/side effects from anesthesia. The patient agrees and gives consent to proceed.  Please refer to procedure notes for findings, recommendations and patient disposition/instructions. 2. Continue current medications and NPO.   Thank you for the  consult. Please call with questions or concerns.  Olean Ree, "Lanny Hurst MD [facility] Gastroenterology [place], [place] [address] [phone]  02/03/2019 12:31 PM

## 2019-02-04 LAB — GLUCOSE, CAPILLARY: Glucose-Capillary: 152 mg/dL — ABNORMAL HIGH (ref 70–99)

## 2019-02-04 MED ORDER — OMEPRAZOLE 20 MG PO CPDR: 20.0000 mg | DELAYED_RELEASE_CAPSULE | Freq: Every day | ORAL | 2 refills | Status: DC

## 2019-02-04 MED ORDER — OMEPRAZOLE 20 MG PO CPDR: 20.0000 mg | DELAYED_RELEASE_CAPSULE | Freq: Every day | ORAL | 1 refills | Status: DC

## 2019-02-04 MED ORDER — PANTOPRAZOLE SODIUM 40 MG PO TBEC
40.0000 mg | DELAYED_RELEASE_TABLET | Freq: Every day | ORAL | Status: DC
  Administered 2019-02-04: 09:00:00 40 mg via ORAL
  Filled 2019-02-04: qty 1

## 2019-02-04 NOTE — Discharge Instructions (Signed)
Do not take aspirin for 1 month

## 2019-02-04 NOTE — Anesthesia Postprocedure Evaluation (Signed)
Anesthesia Post Note  Patient: Gregory Moody  Procedure(s) Performed: ESOPHAGOGASTRODUODENOSCOPY (EGD) (N/A )  Patient location during evaluation: PACU Anesthesia Type: General Level of consciousness: awake and alert and oriented Pain management: pain level controlled Vital Signs Assessment: post-procedure vital signs reviewed and stable Respiratory status: spontaneous breathing Cardiovascular status: blood pressure returned to baseline Anesthetic complications: no     Last Vitals:  Vitals:   02/03/19 1922 02/04/19 0508  BP: 124/80 93/65  Pulse: 91 80  Resp: 16 16  Temp: 36.4 C 36.7 C  SpO2: 100% 100%    Last Pain:  Vitals:   02/04/19 0508  TempSrc: Oral  PainSc:                  Gregory Moody

## 2019-02-05 ENCOUNTER — Encounter: Payer: Self-pay | Admitting: Internal Medicine

## 2019-02-07 LAB — CULTURE, BLOOD (ROUTINE X 2)
Culture: NO GROWTH
Culture: NO GROWTH
Special Requests: ADEQUATE

## 2019-02-07 LAB — BLOOD GAS, VENOUS
Acid-base deficit: 2.8 mmol/L — ABNORMAL HIGH (ref 0.0–2.0)
Bicarbonate: 24.1 mmol/L (ref 20.0–28.0)
FIO2: 0.21
O2 Saturation: 42.5 %
Patient temperature: 37
pCO2, Ven: 49 mmHg (ref 44.0–60.0)
pH, Ven: 7.3 (ref 7.250–7.430)

## 2019-02-08 NOTE — Discharge Summary (Signed)
Gregory Moody, is a 43 y.o. male  DOB 03/11/1976  MRN [id number].  Admission date:  02/02/2019  Admitting Physician  Hillary Bow, MD  Discharge Date:  02/04/2019   Primary MD  [place]  Recommendations for primary care physician for things to follow:  Follow-up with PCP in 1 week    Admission Diagnosis  AKI (acute kidney injury) ([place]) [N17.9] Hypotension, unspecified hypotension type [I95.9] Diabetic ketoacidosis without coma associated with other specified diabetes mellitus ([place]) [E13.10]   Discharge Diagnosis  AKI (acute kidney injury) ([place]) [N17.9] Hypotension, unspecified hypotension type [I95.9] Diabetic ketoacidosis without coma associated with other specified diabetes mellitus ([place]) [E13.10]    Active Problems:   AKI (acute kidney injury) ([place])      Past Medical History:  Diagnosis Date  . Diabetes mellitus without complication ([place])   . Gastroparesis   . Tuberculosis     Past Surgical History:  Procedure Laterality Date  . ESOPHAGOGASTRODUODENOSCOPY N/A 02/03/2019   Procedure: ESOPHAGOGASTRODUODENOSCOPY (EGD);  Surgeon: Toledo, Benay Pike, MD;  Location: [facility] ENDOSCOPY;  Service: Gastroenterology;  Laterality: N/A;  . NO PAST SURGERIES         History of present illness and  Hospital Course:     Kindly see H&P for history of present illness and admission details, please review complete Labs, Consult reports and Test reports for all details in brief  HPI  from the history and physical done on the day of admission 43 year old diabetic came in because of intractable nausea, vomiting for 2 weeks and unable to keep anything down.  Admitted for acute kidney injury, severe hyperglycemia.   Hospital Course  #1 acute kidney injury, admission secondary to poor p.o. intake with  vomiting, dehydration, improved with IV hydration, creatinine is down from 2.28-1.5.  #2 dysphagia, unable to swallow with difficulty swallowing water and food, status post EGD by Dr. Alice Reichert, showed severe distal esophagitis, recommended PPIs for at least 3 months, received IV PPI in the hospital, patient felt better after endoscopy, PPI treatment, discharged home with Protonix 40 mg daily for 3 months, patient need to see Dr. Alice Reichert in 1 month.  Patient advised not to take aspirin till seen by gastroenterology, has severe esophagitis. #3 diabetes mellitus type 2, uncontrolled with hemoglobin A1c 8.7, patient has been having vomiting, poor p.o. intake recently, advised to continue his NPH insulin that he takes and follow-up with [facility] in 1 week.  Discharge Condition: Stable   Follow UP  Follow-up Information    [place] Schedule an appointment as soon as possible for a visit in 1 week(s).   Contact information: [address] [place] [address] [phone]        Efrain Sella, MD. Schedule an appointment as soon as possible for a visit in 1 week(s).   Specialty:  Gastroenterology Contact information: [place] [address] [phone]             Discharge Instructions  and  Discharge Medications      Allergies as of 02/04/2019   No Known Allergies     Medication List    STOP taking these medications   aspirin EC 81 MG tablet   ranitidine 150 MG tablet Commonly known as:  ZANTAC     TAKE these medications   FLUoxetine 20 MG capsule Commonly known as:  PROZAC Take 20 mg by mouth daily.   insulin NPH-regular Human (70-30) 100 UNIT/ML injection 25 units  with breakfast in am and 35 units with supper   omeprazole 20 MG capsule Commonly known as:  PriLOSEC Take 1 capsule (20 mg total) by mouth daily.         Diet and Activity recommendation: See Discharge Instructions above   Consults  obtained -gastroenterology   Major procedures and Radiology Reports - PLEASE review detailed and final reports for all details, in brief -      No results found.  Micro Results     Recent Results (from the past 240 hour(s))  Culture, blood (routine x 2)     Status: None   Collection Time: 02/02/19  4:34 PM  Result Value Ref Range Status   Specimen Description BLOOD RAC  Final   Special Requests   Final    BOTTLES DRAWN AEROBIC AND ANAEROBIC Blood Culture results may not be optimal due to an excessive volume of blood received in culture bottles   Culture   Final    NO GROWTH 5 DAYS Performed at [facility], [address] [place], [place] [address]    Report Status 02/07/2019 FINAL  Final  Culture, blood (routine x 2)     Status: None   Collection Time: 02/02/19  4:34 PM  Result Value Ref Range Status   Specimen Description BLOOD L AC  Final   Special Requests   Final    BOTTLES DRAWN AEROBIC AND ANAEROBIC Blood Culture adequate volume   Culture   Final    NO GROWTH 5 DAYS Performed at [facility], [address], [place], [place] [address]    Report Status 02/07/2019 FINAL  Final       Today   Subjective:   Gregory Moody today has no headache,no chest abdominal pain,no new weakness tingling or numbness, feels much better wants to go home today.   Objective:   Blood pressure 93/65, pulse 80, temperature 98 F (36.7 C), temperature source Oral, resp. rate 16, height 5\' 7"  (1.702 m), weight 60.4 kg, SpO2 100 %.  No intake or output data in the 24 hours ending 02/08/19 1458  Exam Awake Alert, Oriented x 3, No new F.N deficits, Normal affect [place].AT,PERRAL Supple Neck,No JVD, No cervical lymphadenopathy appriciated.  Symmetrical Chest wall movement, Good air movement bilaterally, CTAB RRR,No Gallops,Rubs or new Murmurs, No Parasternal Heave +ve B.Sounds, Abd Soft, Non tender, No organomegaly appriciated, No rebound -guarding or  rigidity. No Cyanosis, Clubbing or edema, No new Rash or bruise  Data Review   CBC w Diff:  Lab Results  Component Value Date   WBC 6.6 02/03/2019   HGB 9.2 (L) 02/03/2019   HCT 28.4 (L) 02/03/2019   PLT 337 02/03/2019   LYMPHOPCT 10 06/20/2018   MONOPCT 3 06/20/2018   EOSPCT 0 06/20/2018   BASOPCT 0 06/20/2018    CMP:  Lab Results  Component Value Date   NA 136 02/03/2019   K 3.5 02/03/2019   CL 104 02/03/2019   CO2 20 (L) 02/03/2019   BUN 24 (H) 02/03/2019   CREATININE 1.50 (H) 02/03/2019   PROT 8.6 (H) 02/02/2019   ALBUMIN 3.8 02/02/2019   BILITOT 1.2 02/02/2019   ALKPHOS 125 02/02/2019   AST 12 (L) 02/02/2019   ALT 8 02/02/2019  .   Total Time in preparing paper work, data evaluation and todays exam - 90 minutes  Epifanio Lesches M.D on 02/04/2019 at 2:58 PM    Note: This dictation was prepared with Dragon dictation along with smaller phrase  technology. Any transcriptional errors that result from this process are unintentional.

## 2019-02-13 ENCOUNTER — Inpatient Hospital Stay
Admission: EM | Admit: 2019-02-13 | Discharge: 2019-02-15 | DRG: 194 | Disposition: A | Discharge status: Home / Self Care | Payer: Self-pay | Attending: Internal Medicine | Admitting: Internal Medicine

## 2019-02-13 ENCOUNTER — Emergency Department: Payer: Self-pay

## 2019-02-13 ENCOUNTER — Other Ambulatory Visit: Payer: Self-pay

## 2019-02-13 ENCOUNTER — Encounter: Payer: Self-pay | Admitting: *Deleted

## 2019-02-13 DIAGNOSIS — Z8719 Personal history of other diseases of the digestive system: Secondary | ICD-10-CM

## 2019-02-13 DIAGNOSIS — R197 Diarrhea, unspecified: Secondary | ICD-10-CM | POA: Diagnosis present

## 2019-02-13 DIAGNOSIS — Z833 Family history of diabetes mellitus: Secondary | ICD-10-CM

## 2019-02-13 DIAGNOSIS — N183 Chronic kidney disease, stage 3 (moderate): Secondary | ICD-10-CM | POA: Diagnosis present

## 2019-02-13 DIAGNOSIS — J189 Pneumonia, unspecified organism: Principal | ICD-10-CM | POA: Diagnosis present

## 2019-02-13 DIAGNOSIS — N179 Acute kidney failure, unspecified: Secondary | ICD-10-CM | POA: Diagnosis present

## 2019-02-13 DIAGNOSIS — R0602 Shortness of breath: Secondary | ICD-10-CM

## 2019-02-13 DIAGNOSIS — E11649 Type 2 diabetes mellitus with hypoglycemia without coma: Secondary | ICD-10-CM | POA: Diagnosis present

## 2019-02-13 DIAGNOSIS — K209 Esophagitis, unspecified without bleeding: Secondary | ICD-10-CM

## 2019-02-13 DIAGNOSIS — Z794 Long term (current) use of insulin: Secondary | ICD-10-CM

## 2019-02-13 DIAGNOSIS — R0789 Other chest pain: Secondary | ICD-10-CM

## 2019-02-13 DIAGNOSIS — Z79899 Other long term (current) drug therapy: Secondary | ICD-10-CM

## 2019-02-13 DIAGNOSIS — K449 Diaphragmatic hernia without obstruction or gangrene: Secondary | ICD-10-CM | POA: Diagnosis present

## 2019-02-13 DIAGNOSIS — Z20828 Contact with and (suspected) exposure to other viral communicable diseases: Secondary | ICD-10-CM | POA: Diagnosis present

## 2019-02-13 DIAGNOSIS — R079 Chest pain, unspecified: Secondary | ICD-10-CM

## 2019-02-13 DIAGNOSIS — J219 Acute bronchiolitis, unspecified: Secondary | ICD-10-CM | POA: Diagnosis present

## 2019-02-13 DIAGNOSIS — Z8611 Personal history of tuberculosis: Secondary | ICD-10-CM

## 2019-02-13 DIAGNOSIS — E1143 Type 2 diabetes mellitus with diabetic autonomic (poly)neuropathy: Secondary | ICD-10-CM | POA: Diagnosis present

## 2019-02-13 DIAGNOSIS — E1122 Type 2 diabetes mellitus with diabetic chronic kidney disease: Secondary | ICD-10-CM | POA: Diagnosis present

## 2019-02-13 DIAGNOSIS — R1310 Dysphagia, unspecified: Secondary | ICD-10-CM | POA: Diagnosis present

## 2019-02-13 DIAGNOSIS — K2210 Ulcer of esophagus without bleeding: Secondary | ICD-10-CM | POA: Diagnosis present

## 2019-02-13 DIAGNOSIS — N189 Chronic kidney disease, unspecified: Secondary | ICD-10-CM

## 2019-02-13 DIAGNOSIS — E1165 Type 2 diabetes mellitus with hyperglycemia: Secondary | ICD-10-CM | POA: Diagnosis present

## 2019-02-13 LAB — GLUCOSE, CAPILLARY: Glucose-Capillary: 34 mg/dL — CL (ref 70–99)

## 2019-02-13 LAB — BASIC METABOLIC PANEL
Anion gap: 7 (ref 5–15)
BUN: 24 mg/dL — ABNORMAL HIGH (ref 6–20)
CO2: 24 mmol/L (ref 22–32)
Calcium: 8.8 mg/dL — ABNORMAL LOW (ref 8.9–10.3)
Chloride: 103 mmol/L (ref 98–111)
Creatinine, Ser: 1.62 mg/dL — ABNORMAL HIGH (ref 0.61–1.24)
GFR calc Af Amer: 59 mL/min — ABNORMAL LOW (ref >60)
GFR calc non Af Amer: 51 mL/min — ABNORMAL LOW (ref >60)
Glucose, Bld: 26 mg/dL — CL (ref 70–99)
Potassium: 3.4 mmol/L — ABNORMAL LOW (ref 3.5–5.1)
Sodium: 134 mmol/L — ABNORMAL LOW (ref 135–145)

## 2019-02-13 LAB — CBC
HCT: 28.8 % — ABNORMAL LOW (ref 39.0–52.0)
Hemoglobin: 9.4 g/dL — ABNORMAL LOW (ref 13.0–17.0)
MCH: 29.7 pg (ref 26.0–34.0)
MCHC: 32.6 g/dL (ref 30.0–36.0)
MCV: 91.1 fL (ref 80.0–100.0)
Platelets: 286 10*3/uL (ref 150–400)
RBC: 3.16 MIL/uL — ABNORMAL LOW (ref 4.22–5.81)
RDW: 12.6 % (ref 11.5–15.5)
WBC: 10.9 10*3/uL — ABNORMAL HIGH (ref 4.0–10.5)
nRBC: 0 % (ref 0.0–0.2)

## 2019-02-13 LAB — TROPONIN I: Troponin I: <0.03 ng/mL (ref <0.03)

## 2019-02-13 IMAGING — CT CT CHEST WITHOUT CONTRAST
2 of 3 series · 15 of 36 positions shown, 18 images · non-contrast
Comparison: [DATE] chest radiograph. [DATE] CT chest.

CLINICAL DATA: 42 y/o M; midsternal chest pain since yesterday.
Shortness of breath. History of tuberculosis.

EXAM:
CT CHEST WITHOUT CONTRAST
TECHNIQUE: Multidetector CT imaging of the chest was performed following the
standard protocol without IV contrast.

[Series 2: thorax · axial · 0.73mm/px · z∈[-337,-45]mm · 12 of 172 slices shown, 15 images]
[im 13/172  mediastinal]
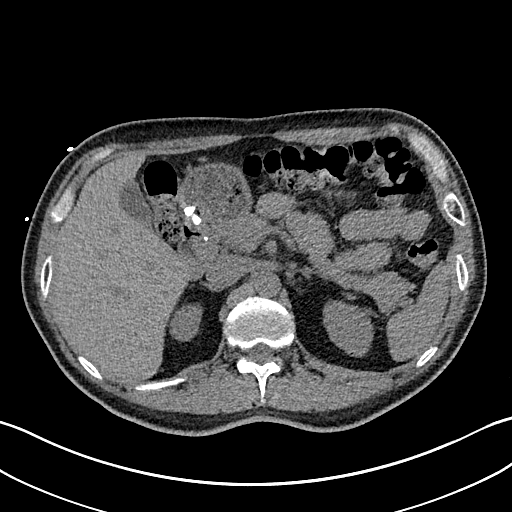
[im 13/172  lung]
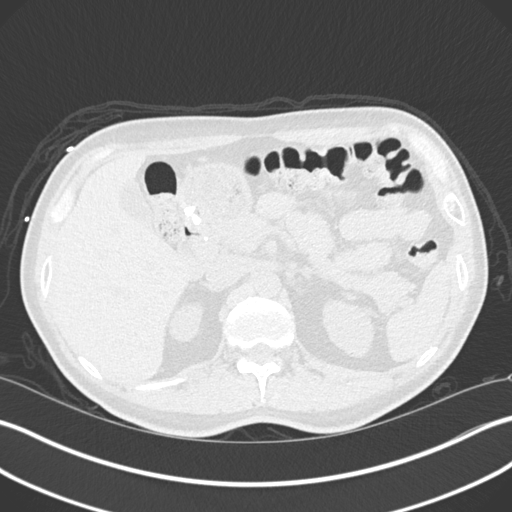
[im 26/172  lung]
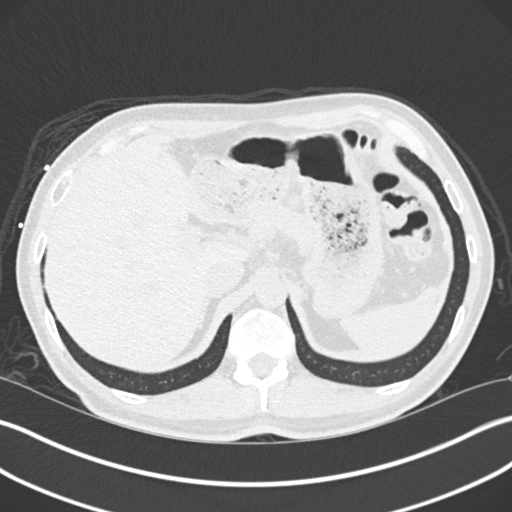
[im 39/172  lung]
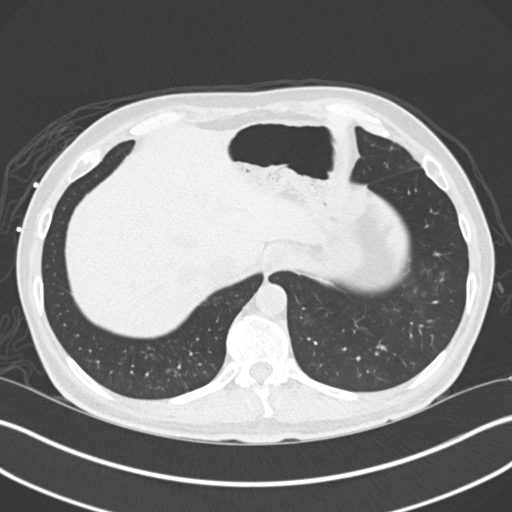
[im 51/172  lung]
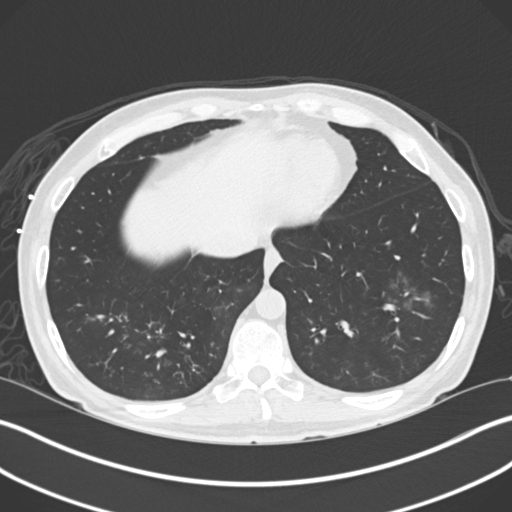
[im 64/172  mediastinal]
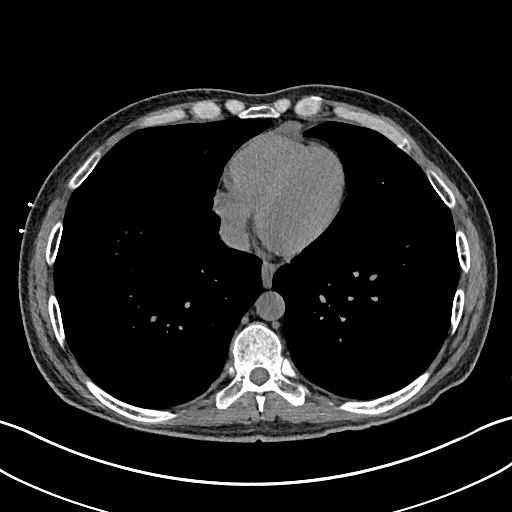
[im 64/172  lung]
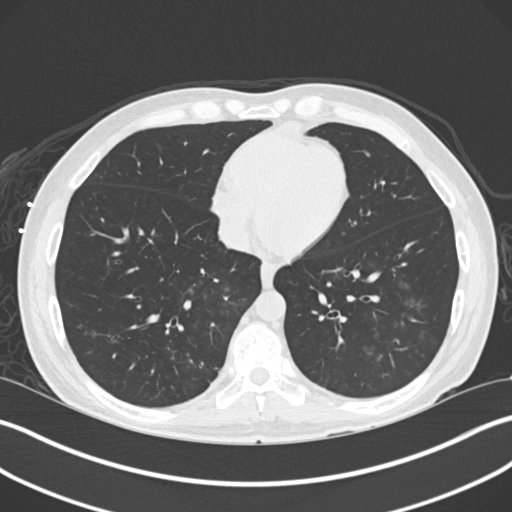
[im 77/172  lung]
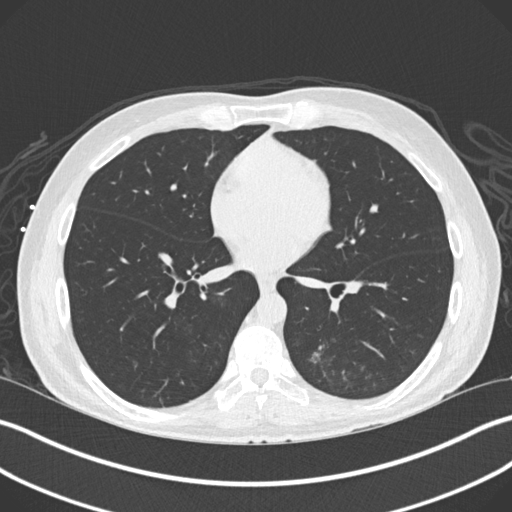
[im 96/172  lung]
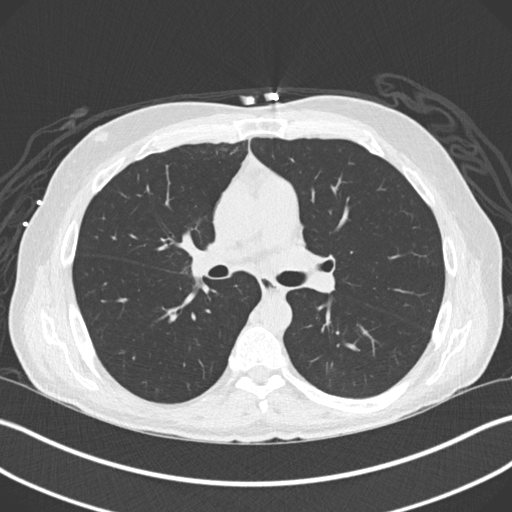
[im 108/172  lung]
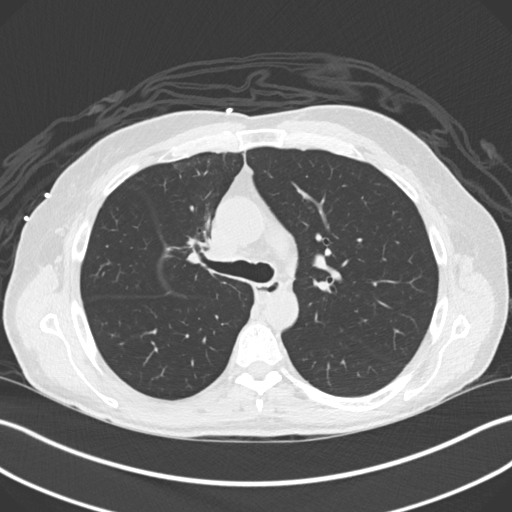
[im 121/172  mediastinal]
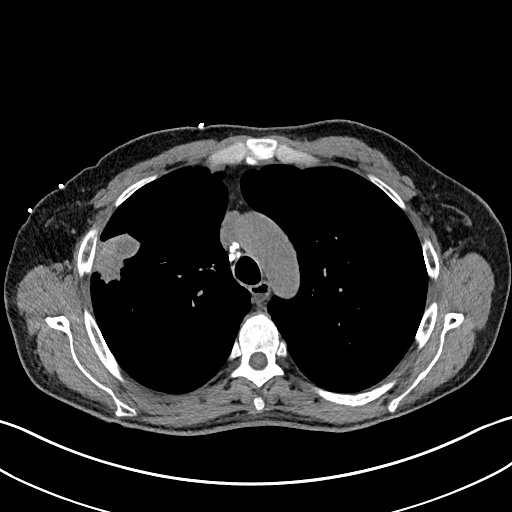
[im 121/172  lung]
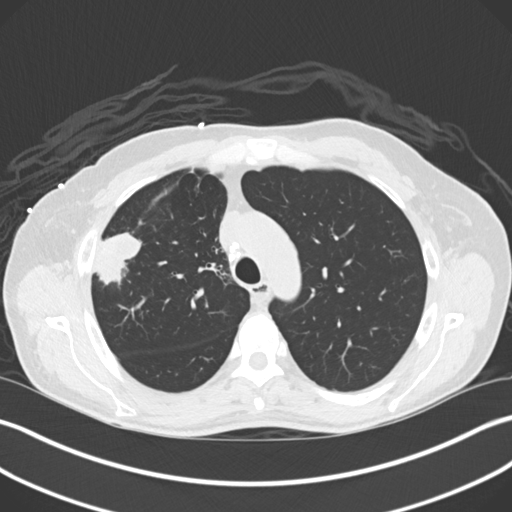
[im 134/172  lung]
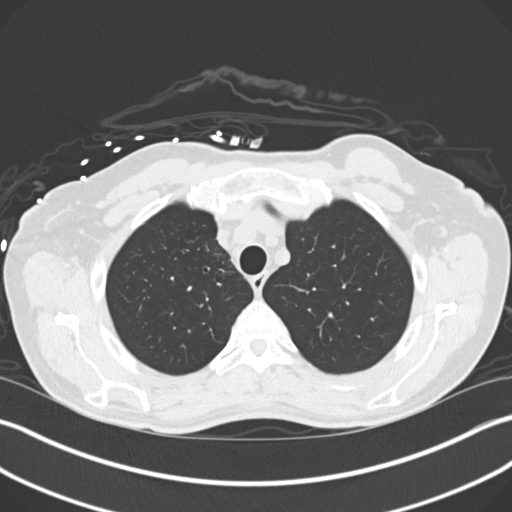
[im 146/172  lung]
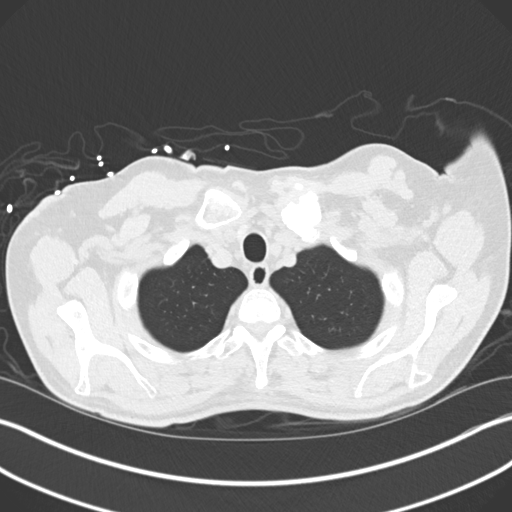
[im 159/172  lung]
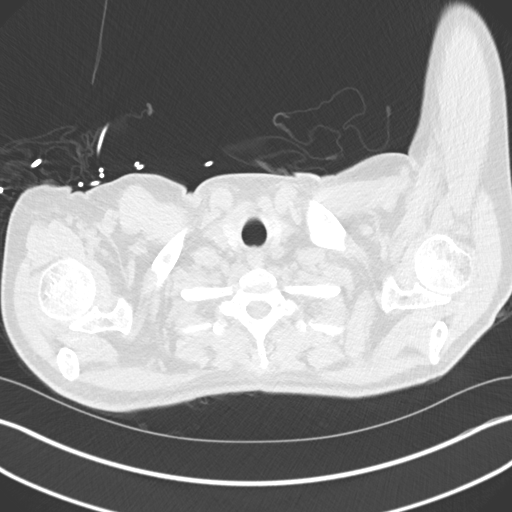

[Series 5: coronal · coronal · 0.70mm/px · 3 of 125 slices shown]
[im 25/125  lung]
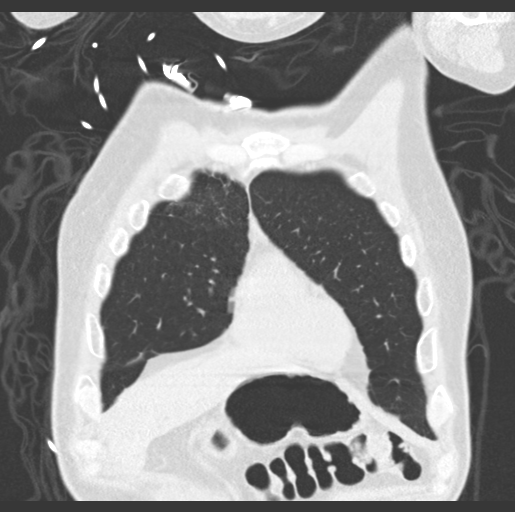
[im 50/125  lung]
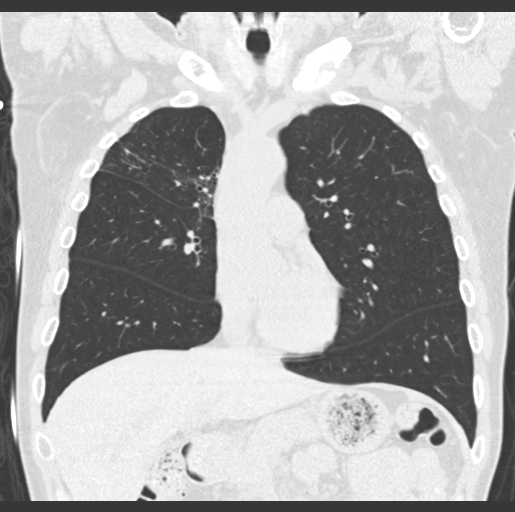
[im 75/125  lung]
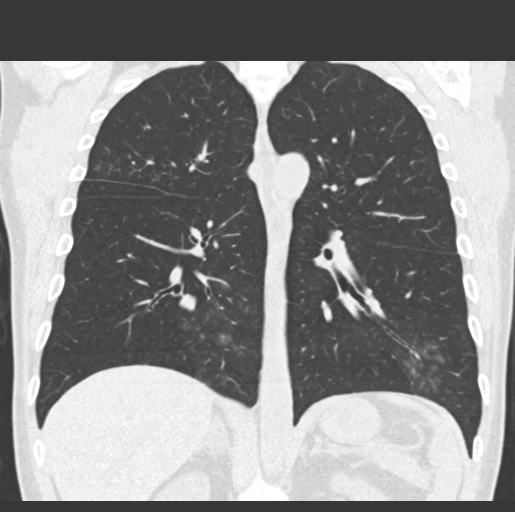

[15 of 36 positions shown; findings below may reference images not displayed]

FINDINGS: Cardiovascular: No significant vascular findings. Normal heart size.
No pericardial effusion.

Mediastinum/Nodes: Calcified right paratracheal mediastinal lymph
nodes compatible. No mediastinal or axillary lymphadenopathy. Normal
thyroid gland. Esophagus is unremarkable.

Lungs/Pleura: Small ground-glass opacities are present within the
bilateral lower lobes in a bronchovascular distribution as well as
tree-in-bud opacities. There is bronchiectasis within the anterior
aspect the right upper lobe with coarse interstitial changes likely
representing sequelae of prior pneumonia.

Additionally, there is a masslike opacity within the periphery of
right upper lobe measuring 3.9 x 3.2 x 2.5 cm corresponding to the
region of consolidation on prior CT of chest and multiple interval
chest radiographs. The lesion is well-circumscribed.

Upper Abdomen: No acute abnormality.

Musculoskeletal: No acute osseous abnormality. Chronic fracture
deformity of the left medial clavicle.
IMPRESSION: 1. Acute bronchiolitis in the lower lobes, probably infectious or
possibly related to aspiration given distribution. This pattern can
be seen with mycobacterial infection including tuberculosis which
should be excluded given patient history.
2. Lesion in the periphery of the right upper lobe is present on
multiple interval chest radiographs from the prior CT. The lesion is
a suspected tuberculoma. Consider 3-6 month follow-up CT of the
chest to ensure stability.
3. Calcified mediastinal lymph nodes. No mediastinal
lymphadenopathy.

These results were called by telephone at the time of interpretation
on [DATE] at [DATE] to Dr. BATONGBACAL , who verbally
acknowledged these results.

## 2019-02-13 IMAGING — DX PORTABLE CHEST - 1 VIEW
1 series · 1 of 1 positions shown · non-contrast
Comparison: [DATE], [DATE], [DATE], [DATE]

CLINICAL DATA: Chest pain

EXAM:
PORTABLE CHEST 1 VIEW

[chest ap]
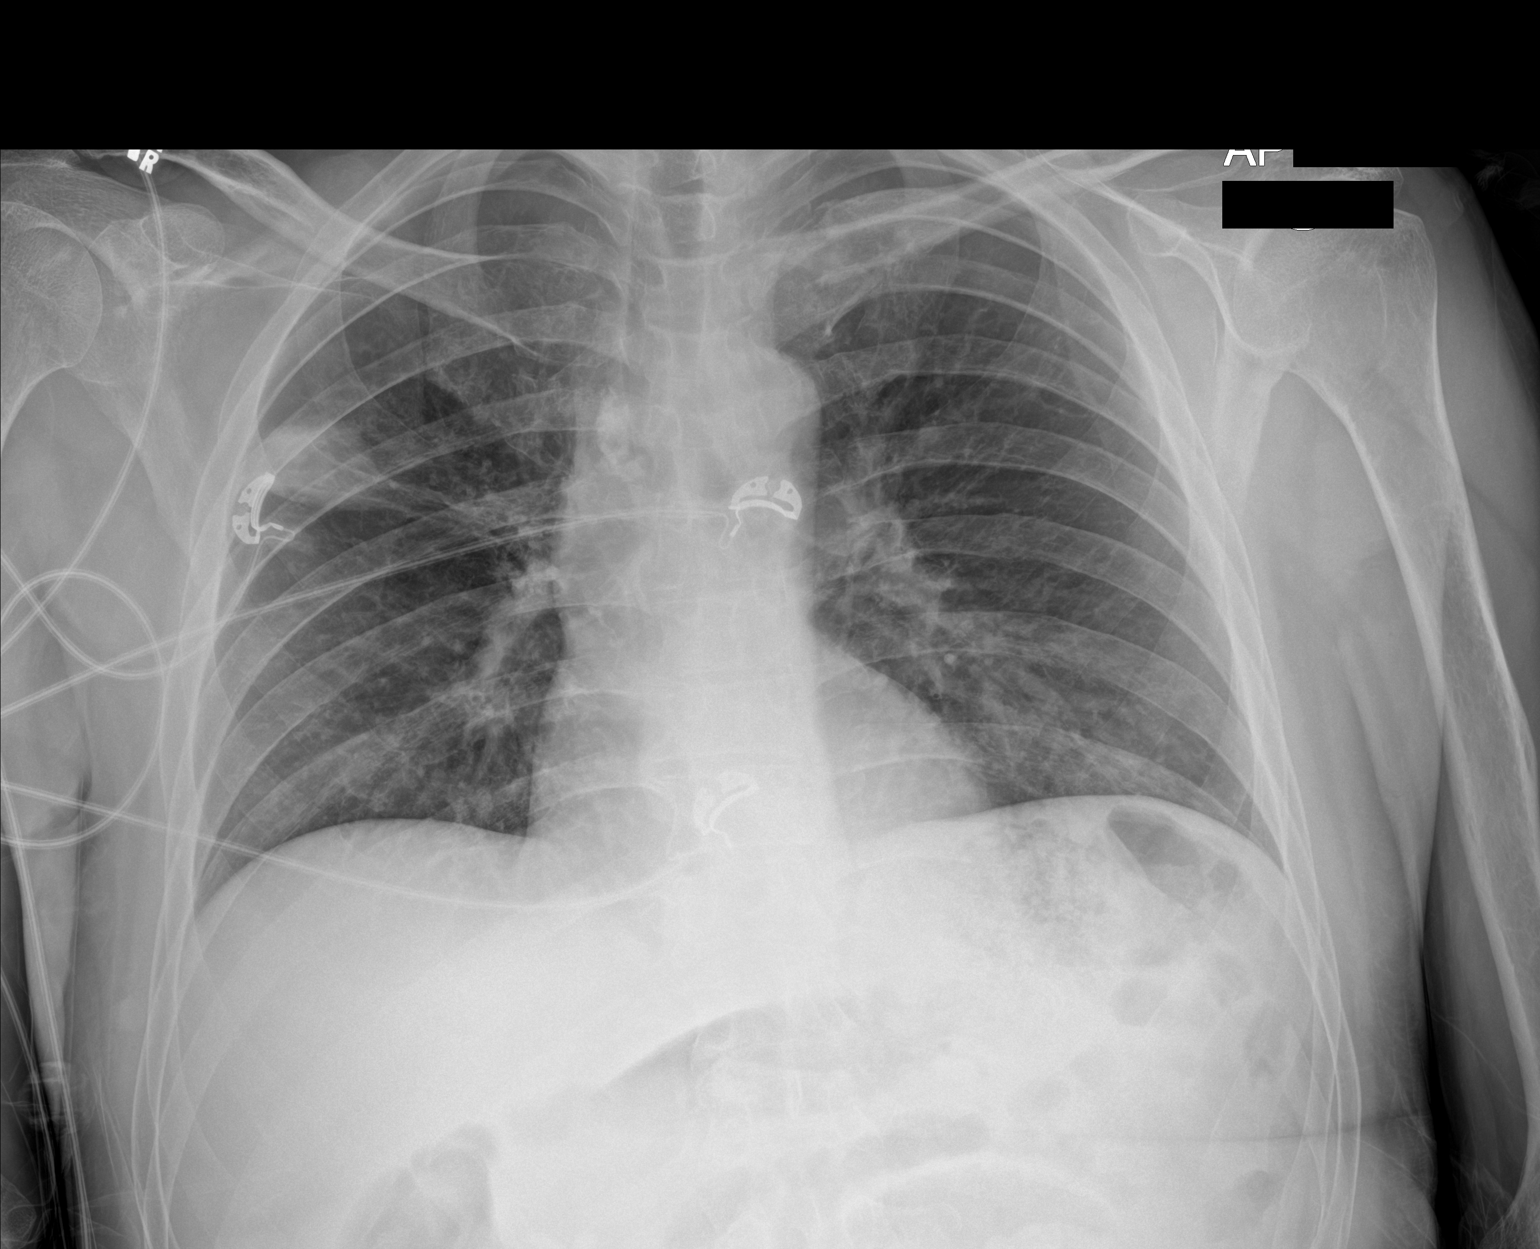

[1 of 1 positions shown; findings below may reference images not displayed]

FINDINGS: Focal ovoid opacity in the right upper lobe adjacent to the fissure,
intermittently visualized on comparison studies, appears marked
convex in configuration. Possible early infiltrate at the left base.
No pleural effusion. Normal heart size. No pneumothorax.
IMPRESSION: 1. Possible early infiltrate at the left base
2. Focal ovoid opacity in the right upper lobe adjacent to the
fissure, slight increased convex morphology on today's study, raises
possibility of acute on chronic process.

## 2019-02-13 MED ORDER — DEXTROSE 50 % IV SOLN
1.0000 | Freq: Once | INTRAVENOUS | Status: AC
  Administered 2019-02-13: 50 mL via INTRAVENOUS

## 2019-02-13 MED ORDER — DEXTROSE 50 % IV SOLN: 1.0000 | Freq: Once | INTRAVENOUS | Status: DC

## 2019-02-13 MED ORDER — SODIUM CHLORIDE 0.9% FLUSH: 3.0000 mL | Freq: Once | INTRAVENOUS | Status: DC

## 2019-02-13 MED ORDER — DEXTROSE 50 % IV SOLN
INTRAVENOUS | Status: AC
  Administered 2019-02-13: 50 mL via INTRAVENOUS
  Filled 2019-02-13: qty 50

## 2019-02-13 MED ORDER — SODIUM CHLORIDE 4 MEQ/ML IV SOLN
INTRAVENOUS | Status: DC
  Administered 2019-02-14 (×2): via INTRAVENOUS
  Filled 2019-02-13 (×3): qty 1000

## 2019-02-13 MED ORDER — DEXTROSE 50 % IV SOLN
50.0000 mL | Freq: Once | INTRAVENOUS | Status: AC
  Administered 2019-02-13: 50 mL via INTRAVENOUS
  Filled 2019-02-13: qty 50

## 2019-02-13 NOTE — ED Notes (Signed)
Pt to CT via stretcher accomp by CT tech 

## 2019-02-13 NOTE — ED Notes (Signed)
MD notified of Gregory Moody; pt remains A&Ox3; denies any recent illness or problems with his FSBS at home & denies taking any extra ds of insulin; pt voices good understanding of plan of care and need for possible admission to hosp for further eval

## 2019-02-13 NOTE — ED Provider Notes (Addendum)
[facility] Emergency Department Provider Note  ____________________________________________   First MD Initiated Contact with Patient 02/13/19 2308     (approximate)  I have reviewed the triage vital signs and the nursing notes.   HISTORY  Chief Complaint Chest Pain   The patient and/or family speak(s) Spanish.  They understand they have the right to the use of a hospital interpreter, however at this time they prefer to speak directly with me in [place].  They know that they can ask for an interpreter at any time.    HPI Gregory Moody is a 43 y.o. male with medical history as listed below which notably includes poorly controlled insulin-dependent diabetes with a history of gastroparesis.  He presents tonight for evaluation of chest pain.  He says the chest pain is worse after he eats and depending what kinds of food he eats it gets worse.  It feels like an aching dull pain as sometimes burning.  He denies vomiting but has had some nausea.  He said he was admitted to the hospital a couple months ago for a similar issue.  He is also followed up with his outpatient doctor and has plans to have further cardiac work-up apparently with a monitor and a follow-up appointment.  However the pain was worse tonight after dinner so he came in for further evaluation.  The patient felt weak and a little bit dizzy when he was in the emergency department earlier just after he was placed in a room and this corresponded with discovering on his metabolic panel that he had a glucose in the 20s.  He felt better after he was given D50.  He said that he feels fine right now and is not currently experiencing any pain or discomfort.  The symptoms can be severe at times and mild at other times.  He sometimes has some shortness of breath associated with the pain.  He denies fever/chills, sore throat, and dysuria.  He has been urinating normally and trying to drink fluids.          Past Medical History:  Diagnosis Date   Diabetes mellitus without complication ([place])    Gastroparesis    Tuberculosis     Patient Active Problem List   Diagnosis Date Noted   AKI (acute kidney injury) ([place]) 02/02/2019   ARF (acute renal failure) ([place]) 01/03/2019   Malnutrition of moderate degree 02/24/2018   GERD (gastroesophageal reflux disease) 02/23/2018   Diabetic gastroparesis ([place]) 02/23/2018   Diabetic foot ulcer ([place]) 02/23/2018   Aspiration pneumonia ([place]) 04/21/2017   Hypoglycemia 04/21/2017   Diabetes mellitus with hyperglycemia ([place]) 04/21/2017   Hip fracture, unspecified laterality, closed, initial encounter ([place]) 04/17/2017   Hyperglycemia 04/17/2017   Protein-calorie malnutrition, severe 04/09/2016   Cavitary lesion of lung 04/06/2016    Past Surgical History:  Procedure Laterality Date   ESOPHAGOGASTRODUODENOSCOPY N/A 02/03/2019   Procedure: ESOPHAGOGASTRODUODENOSCOPY (EGD);  Surgeon: Tol[place], Benay Pike, MD;  Location: [facility] ENDOSCOPY;  Service: Gastroenterology;  Laterality: N/A;   NO PAST SURGERIES      Prior to Admission medications   Medication Sig Start Date End Date Taking? Authorizing Provider  FLUoxetine (PROZAC) 20 MG capsule Take 20 mg by mouth daily.    [provider]  insulin NPH-regular Human (70-30) 100 UNIT/ML injection 25 units with breakfast in am and 35 units with supper 01/04/19   Demetrios Loll, MD  insulin NPH-regular Human (NOVOLIN 70/30) (70-30) 100 UNIT/ML injection Inject 55 Units into the skin 2 (  two) times daily before a meal. 02/24/18 03/27/18  Bettey Costa, MD  omeprazole (PRILOSEC) 20 MG capsule Take 1 capsule (20 mg total) by mouth daily. 02/04/19 02/04/20  Epifanio Lesches, MD    Allergies Patient has no known allergies.  Family History  Problem Relation Age of Onset   Diabetes Mother    Diabetes Father     Social History Social History   Tobacco Use   Smoking status: Never Smoker    Smokeless tobacco: Never Used  Substance Use Topics   Alcohol use: No   Drug use: No    Review of Systems Constitutional: No fever/chills Eyes: No visual changes. ENT: No sore throat. Cardiovascular: +chest pain. Respiratory: +shortness of breath. Gastrointestinal: No abdominal pain.  Nausea, no vomiting.  No diarrhea.  No constipation. Genitourinary: Negative for dysuria. Musculoskeletal: Negative for neck pain.  Negative for back pain. Integumentary: Negative for rash. Neurological: Negative for headaches, focal weakness or numbness.   ____________________________________________   PHYSICAL EXAM:  VITAL SIGNS: ED Triage Vitals  Enc Vitals Group     BP 02/13/19 2152 99/66     Pulse Rate 02/13/19 2152 71     Resp 02/13/19 2152 18     Temp 02/13/19 2152 97.7 F (36.5 C)     Temp Source 02/13/19 2152 Oral     SpO2 02/13/19 2152 100 %     Weight 02/13/19 2152 59 kg (130 lb)     Height 02/13/19 2152 1.727 m (5\' 8" )     Head Circumference --      Peak Flow --      Pain Score 02/13/19 2203 8     Pain Loc --      Pain Edu? --      Excl. in [place]? --     Constitutional: Alert and oriented.  Appears to have a degree of chronic illness but is in no acute distress at this time. Eyes: Conjunctivae are normal.  Head: Atraumatic. Nose: No congestion/rhinnorhea. Mouth/Throat: Mucous membranes are moist. Neck: No stridor.  No meningeal signs.   Cardiovascular: Normal rate, regular rhythm. Good peripheral circulation. Grossly normal heart sounds. Respiratory: Normal respiratory effort.  No retractions. No audible wheezing. Gastrointestinal: Soft and nontender. No distention.  Musculoskeletal: No lower extremity tenderness nor edema. No gross deformities of extremities. Neurologic:  Normal speech and language. No gross focal neurologic deficits are appreciated.  Skin:  Skin is warm, dry and intact. No rash noted. Psychiatric: Mood and affect are normal. Speech and behavior are  normal.  ____________________________________________   LABS (all labs ordered are listed, but only abnormal results are displayed)  Labs Reviewed  BASIC METABOLIC PANEL - Abnormal; Notable for the following components:      Result Value   Sodium 134 (*)    Potassium 3.4 (*)    Glucose, Bld 26 (*)    BUN 24 (*)    Creatinine, Ser 1.62 (*)    Calcium 8.8 (*)    GFR calc non Af Amer 51 (*)    GFR calc Af Amer 59 (*)    All other components within normal limits  CBC - Abnormal; Notable for the following components:   WBC 10.9 (*)    RBC 3.16 (*)    Hemoglobin 9.4 (*)    HCT 28.8 (*)    All other components within normal limits  GLUCOSE, CAPILLARY - Abnormal; Notable for the following components:   Glucose-Capillary 34 (*)    All other components within normal limits  GLUCOSE, CAPILLARY - Abnormal; Notable for the following components:   Glucose-Capillary 50 (*)    All other components within normal limits  SARS CORONAVIRUS 2 (HOSPITAL ORDER, [place] [facility])  CULTURE, BLOOD (ROUTINE X 2)  CULTURE, BLOOD (ROUTINE X 2)  TROPONIN I  HEPATIC FUNCTION PANEL  LIPASE, BLOOD  PROTIME-INR  APTT  LACTIC ACID, PLASMA  LACTIC ACID, PLASMA  TROPONIN I  CBG MONITORING, ED  CBG MONITORING, ED   ____________________________________________  EKG  ED ECG REPORT I, Hinda Kehr, the attending physician, personally viewed and interpreted this ECG.  Date: 02/13/2019 EKG Time: 21: 55 Rate: 68 Rhythm: normal sinus rhythm QRS Axis: normal Intervals: Short PR interval of 106 ms, otherwise unremarkable ST/T Wave abnormalities: normal Narrative Interpretation: no evidence of acute ischemia  ____________________________________________  RADIOLOGY I, Hinda Kehr, personally viewed and evaluated these images (plain radiographs) as part of my medical decision making, as well as reviewing the written report by the radiologist.  ED MD interpretation: Possible  early infiltrate at the left base and what appears to likely be a chronic right upper lobe structure which could reflect his prior history of tuberculosis.  It does not seem to have changed significantly over time.  Official radiology report(s): Ct Chest Wo Contrast  Result Date: 02/14/2019 CLINICAL DATA:  43 y/o M; midsternal chest pain since yesterday. Shortness of breath. History of tuberculosis. EXAM: CT CHEST WITHOUT CONTRAST TECHNIQUE: Multidetector CT imaging of the chest was performed following the standard protocol without IV contrast. COMPARISON:  02/13/2019 chest radiograph. 04/06/2016 CT chest. FINDINGS: Cardiovascular: No significant vascular findings. Normal heart size. No pericardial effusion. Mediastinum/Nodes: Calcified right paratracheal mediastinal lymph nodes compatible. No mediastinal or axillary lymphadenopathy. Normal thyroid gland. Esophagus is unremarkable. Lungs/Pleura: Small ground-glass opacities are present within the bilateral lower lobes in a bronchovascular distribution as well as tree-in-bud opacities. There is bronchiectasis within the anterior aspect the right upper lobe with coarse interstitial changes likely representing sequelae of prior pneumonia. Additionally, there is a masslike opacity within the periphery of right upper lobe measuring 3.9 x 3.2 x 2.5 cm corresponding to the region of consolidation on prior CT of chest and multiple interval chest radiographs. The lesion is well-circumscribed. Upper Abdomen: No acute abnormality. Musculoskeletal: No acute osseous abnormality. Chronic fracture deformity of the left medial clavicle. IMPRESSION: 1. Acute bronchiolitis in the lower lobes, probably infectious or possibly related to aspiration given distribution. This pattern can be seen with mycobacterial infection including tuberculosis which should be excluded given patient history. 2. Lesion in the periphery of the right upper lobe is present on multiple interval chest  radiographs from the prior CT. 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 Consider 3-6 month follow-up CT of the chest to ensure stability. 3. Calcified mediastinal lymph nodes. No mediastinal lymphadenopathy. These results were called by telephone at the time of interpretation on 02/14/2019 at 12:00 am to Dr. Hinda Kehr , who verbally acknowledged these results. Electronically Signed   By: Kristine Garbe M.D.   On: 02/14/2019 00:13   Dg Chest Port 1 View  Result Date: 02/13/2019 CLINICAL DATA:  Chest pain EXAM: PORTABLE CHEST 1 VIEW COMPARISON:  12/14/2018, 06/20/2018, 02/05/2018, 08/12/2017 FINDINGS: Focal ovoid opacity in the right upper lobe adjacent to the fissure, intermittently visualized on comparison studies, appears marked convex in configuration. Possible early infiltrate at the left base. No pleural effusion. Normal heart size. No pneumothorax. IMPRESSION: 1. Possible early infiltrate at the left base 2. Focal ovoid opacity in  the right upper lobe adjacent to the fissure, slight increased convex morphology on today's study, raises possibility of acute on chronic process. Electronically Signed   By: Donavan Foil M.D.   On: 02/13/2019 22:52    ____________________________________________   PROCEDURES   Procedure(s) performed (including Critical Care):  Procedures   ____________________________________________   INITIAL IMPRESSION / MDM / [place] / ED COURSE  As part of my medical decision making, I reviewed the following data within the [place] notes reviewed and incorporated, Labs reviewed , EKG interpreted , Old chart reviewed, Discussed with admitting physician , Discussed with radiologist and Notes from prior ED visits      *Gregory Moody was evaluated in Emergency Department on 02/14/2019 for the symptoms described in the history of present illness. He was evaluated in the context of the global COVID-19 pandemic, which  necessitated consideration that the patient might be at risk for infection with the SARS-CoV-2 virus that causes COVID-19. Institutional protocols and algorithms that pertain to the evaluation of patients at risk for COVID-19 are in a state of rapid change based on information released by regulatory bodies including the CDC and federal and state organizations. These policies and algorithms were followed during the patient's care in the ED.*  Differential diagnosis includes, but is not limited to, acid reflux/esophagitis, atypical chest pain due to ACS, PE, COVID-19, tuberculosis, pneumonia.  The patient also had profound hypoglycemia and even he was surprised by this because normally his blood sugar is far too high.  He said that he took a higher than usual dose of glucose tonight around 9 PM because he did not take a dose in the morning and he said that his blood sugar was greater than 400 but he was surprised to find out that his glucose was down in the 20s.  He is currently asymptomatic.  His EKG is reassuring with no evidence of ischemia.  I reviewed the medical record and saw that when he was admitted a couple months ago he had an upper endoscopy which demonstrated severe esophagitis.  I suspect he is still suffering from this issue and that is why he is having his discomfort and why it is worse after he eats.  His lab work is notable for no leukocytosis, stable hemoglobin, and a basic metabolic panel that demonstrates a creatinine of 1.62 but this is actually about normal for him and considerably lower and more reassuring that was during his last admission.  His biggest issue is the glucose 26 which has been at least temporarily addressed with D50.  Troponin was negative.  Given the abnormal chest x-ray, I am obtaining a CT scan of the chest and then will recheck a fingerstick blood sugar.  If his blood sugar is stable I think that there is little evidence of acute/emergent medical condition.  He has had  the symptoms in the past and I think they are likely representative of esophagitis.  He should be able to follow-up with an outpatient doctor and he should have contact with primary care as well as GI.  However, if there is an acute abnormality on his chest CT or if his blood sugar continues to drop, he may require admission for careful glucose monitoring and D5 or D10 drip.  Agrees with the plan.  Clinical Course as of Feb 14 103  Wed Feb 14, 2019  0000 Repeat fingerstick only about 45 minutes since getting the D50 was 34.  I am starting  D10 half-normal saline infusion with 40 mill equivalents KCl at 100 mL/h. COVID-19 swab is in process as per hospital admission protocol.  CT scan is pending.  Glucose-Capillary(!!): 34 [CF]  [id number] Dr. Toney Reil with radiology called me to express concern that the patient's chest CT could represent reactivation tuberculosis.  I had the patient moved to a negative pressure room.  I will treat him for atypical pneumonia (community-acquired) at this time with ceftriaxone 1 g IV and azithromycin 500 mg IV.  I will defer the tuberculosis testing to inpatient.  Coronavirus testing is pending and disposition will be determined by the results.  The patient is in no distress.  He is getting D10 normal saline infusion and his most recent blood sugar has improved slightly to 50.  We will continue the infusion.  Blood cultures and lactic acid are pending.   [CF]  0103 Of note, the patient does not meet sepsis criteria and he is not being treated as such.   [CF]    Clinical Course User Index [CF] Hinda Kehr, MD     ____________________________________________  FINAL CLINICAL IMPRESSION(S) / ED DIAGNOSES  Final diagnoses:  Hypoglycemia due to type 2 diabetes mellitus ([place])  Atypical chest pain  Esophagitis  Shortness of breath  Atypical pneumonia  Chronic renal failure, unspecified CKD stage     MEDICATIONS GIVEN DURING THIS VISIT:  Medications  dextrose 10 %  1,000 mL with sodium chloride 0.45 %, potassium chloride 40 mEq/L infusion ( Intravenous New Bag/Given 02/14/19 0044)  cefTRIAXone (ROCEPHIN) 1 g in sodium chloride 0.9 % 100 mL IVPB (has no administration in time range)  azithromycin (ZITHROMAX) 500 mg in sodium chloride 0.9 % 250 mL IVPB (has no administration in time range)  dextrose 50 % solution 50 mL (50 mLs Intravenous Given 02/13/19 2251)  dextrose 50 % solution 50 mL (50 mLs Intravenous Given 02/13/19 2351)     ED Discharge Orders    None       Note:  This document was prepared using Dragon voice recognition software and may include unintentional dictation errors.   Hinda Kehr, MD 02/14/19 0932    Hinda Kehr, MD 02/14/19 [phone]

## 2019-02-13 NOTE — ED Notes (Signed)
Pt to room 3 via w/c from triage with no distress noted; assisted into hosp gown & on card monitor; pt reports since yesterday having mid CP, nonradiating; pt denies any accomp symptoms and denies hx of same; resp even/unlab, lungs clear, apical audible & regular, +BS, abd soft/nondist/nontender

## 2019-02-13 NOTE — ED Triage Notes (Addendum)
Patient reports mid-sternal chest pain since yesterday.Patient c/o being nauseous and diaphoretic.  Patient also c/o being short of breath. Patient was seen two weeks ago for same symptoms.

## 2019-02-13 NOTE — ED Notes (Signed)
MD notified of glucose result; orders obtained; pt uprite on stretcher in exam room with no distress noted

## 2019-02-14 ENCOUNTER — Other Ambulatory Visit: Payer: Self-pay

## 2019-02-14 DIAGNOSIS — J209 Acute bronchitis, unspecified: Secondary | ICD-10-CM

## 2019-02-14 DIAGNOSIS — K3184 Gastroparesis: Secondary | ICD-10-CM

## 2019-02-14 DIAGNOSIS — K2210 Ulcer of esophagus without bleeding: Secondary | ICD-10-CM

## 2019-02-14 DIAGNOSIS — Z8611 Personal history of tuberculosis: Secondary | ICD-10-CM

## 2019-02-14 DIAGNOSIS — J189 Pneumonia, unspecified organism: Secondary | ICD-10-CM | POA: Diagnosis present

## 2019-02-14 DIAGNOSIS — E1043 Type 1 diabetes mellitus with diabetic autonomic (poly)neuropathy: Secondary | ICD-10-CM

## 2019-02-14 DIAGNOSIS — K449 Diaphragmatic hernia without obstruction or gangrene: Secondary | ICD-10-CM

## 2019-02-14 DIAGNOSIS — R1310 Dysphagia, unspecified: Secondary | ICD-10-CM

## 2019-02-14 DIAGNOSIS — R911 Solitary pulmonary nodule: Secondary | ICD-10-CM

## 2019-02-14 DIAGNOSIS — R079 Chest pain, unspecified: Secondary | ICD-10-CM

## 2019-02-14 DIAGNOSIS — E10649 Type 1 diabetes mellitus with hypoglycemia without coma: Secondary | ICD-10-CM

## 2019-02-14 DIAGNOSIS — R0602 Shortness of breath: Secondary | ICD-10-CM

## 2019-02-14 LAB — GLUCOSE, CAPILLARY
Glucose-Capillary: 124 mg/dL — ABNORMAL HIGH (ref 70–99)
Glucose-Capillary: 146 mg/dL — ABNORMAL HIGH (ref 70–99)
Glucose-Capillary: 164 mg/dL — ABNORMAL HIGH (ref 70–99)
Glucose-Capillary: 179 mg/dL — ABNORMAL HIGH (ref 70–99)
Glucose-Capillary: 181 mg/dL — ABNORMAL HIGH (ref 70–99)
Glucose-Capillary: 187 mg/dL — ABNORMAL HIGH (ref 70–99)
Glucose-Capillary: 24 mg/dL — CL (ref 70–99)
Glucose-Capillary: 313 mg/dL — ABNORMAL HIGH (ref 70–99)
Glucose-Capillary: 355 mg/dL — ABNORMAL HIGH (ref 70–99)
Glucose-Capillary: 50 mg/dL — ABNORMAL LOW (ref 70–99)

## 2019-02-14 LAB — BASIC METABOLIC PANEL
Anion gap: 6 (ref 5–15)
BUN: 21 mg/dL — ABNORMAL HIGH (ref 6–20)
CO2: 20 mmol/L — ABNORMAL LOW (ref 22–32)
Calcium: 8.6 mg/dL — ABNORMAL LOW (ref 8.9–10.3)
Chloride: 108 mmol/L (ref 98–111)
Creatinine, Ser: 1.48 mg/dL — ABNORMAL HIGH (ref 0.61–1.24)
GFR calc Af Amer: >60 mL/min (ref >60)
GFR calc non Af Amer: 57 mL/min — ABNORMAL LOW (ref >60)
Glucose, Bld: 203 mg/dL — ABNORMAL HIGH (ref 70–99)
Potassium: 5.2 mmol/L — ABNORMAL HIGH (ref 3.5–5.1)
Sodium: 134 mmol/L — ABNORMAL LOW (ref 135–145)

## 2019-02-14 LAB — LACTIC ACID, PLASMA: Lactic Acid, Venous: 1.4 mmol/L (ref 0.5–1.9)

## 2019-02-14 LAB — POTASSIUM: Potassium: 4.9 mmol/L (ref 3.5–5.1)

## 2019-02-14 LAB — TSH: TSH: 0.272 u[IU]/mL — ABNORMAL LOW (ref 0.350–4.500)

## 2019-02-14 LAB — HEPATIC FUNCTION PANEL
ALT: 10 U/L (ref 0–44)
AST: 13 U/L — ABNORMAL LOW (ref 15–41)
Albumin: 3.5 g/dL (ref 3.5–5.0)
Alkaline Phosphatase: 93 U/L (ref 38–126)
Bilirubin, Direct: <0.1 mg/dL (ref 0.0–0.2)
Total Bilirubin: 0.6 mg/dL (ref 0.3–1.2)
Total Protein: 7.2 g/dL (ref 6.5–8.1)

## 2019-02-14 LAB — APTT: aPTT: 36 seconds (ref 24–36)

## 2019-02-14 LAB — LIPASE, BLOOD: Lipase: 45 U/L (ref 11–51)

## 2019-02-14 LAB — MRSA PCR SCREENING: MRSA by PCR: NEGATIVE

## 2019-02-14 LAB — SARS CORONAVIRUS 2 BY RT PCR (HOSPITAL ORDER, PERFORMED IN ~~LOC~~ HOSPITAL LAB): SARS Coronavirus 2: NEGATIVE

## 2019-02-14 LAB — PROTIME-INR
INR: 1.1 (ref 0.8–1.2)
Prothrombin Time: 13.6 seconds (ref 11.4–15.2)

## 2019-02-14 LAB — TROPONIN I: Troponin I: <0.03 ng/mL (ref <0.03)

## 2019-02-14 LAB — PROCALCITONIN: Procalcitonin: 0.12 ng/mL

## 2019-02-14 MED ORDER — ACETAMINOPHEN 325 MG PO TABS: 650.0000 mg | ORAL_TABLET | Freq: Four times a day (QID) | ORAL | Status: DC | PRN

## 2019-02-14 MED ORDER — SODIUM CHLORIDE 0.9 % IV SOLN
1.0000 g | INTRAVENOUS | Status: AC
  Administered 2019-02-14: 1 g via INTRAVENOUS
  Filled 2019-02-14: qty 10

## 2019-02-14 MED ORDER — ACETAMINOPHEN 650 MG RE SUPP: 650.0000 mg | Freq: Four times a day (QID) | RECTAL | Status: DC | PRN

## 2019-02-14 MED ORDER — DEXTROSE 5 % IV SOLN: 250.0000 mg | INTRAVENOUS | Status: DC

## 2019-02-14 MED ORDER — ENOXAPARIN SODIUM 40 MG/0.4ML ~~LOC~~ SOLN
40.0000 mg | SUBCUTANEOUS | Status: DC
  Administered 2019-02-15: 40 mg via SUBCUTANEOUS
  Filled 2019-02-14: qty 0.4

## 2019-02-14 MED ORDER — ENOXAPARIN SODIUM 40 MG/0.4ML ~~LOC~~ SOLN
40.0000 mg | SUBCUTANEOUS | Status: DC
  Administered 2019-02-14: 40 mg via SUBCUTANEOUS
  Filled 2019-02-14: qty 0.4

## 2019-02-14 MED ORDER — INSULIN ASPART 100 UNIT/ML ~~LOC~~ SOLN: 0.0000 [IU] | Freq: Every day | SUBCUTANEOUS | Status: DC

## 2019-02-14 MED ORDER — DEXTROSE-NACL 5-0.45 % IV SOLN
INTRAVENOUS | Status: DC
  Administered 2019-02-14: 12:00:00 via INTRAVENOUS

## 2019-02-14 MED ORDER — ONDANSETRON HCL 4 MG/2ML IJ SOLN: 4.0000 mg | Freq: Four times a day (QID) | INTRAMUSCULAR | Status: DC | PRN

## 2019-02-14 MED ORDER — INSULIN DETEMIR 100 UNIT/ML ~~LOC~~ SOLN
12.0000 [IU] | Freq: Two times a day (BID) | SUBCUTANEOUS | Status: DC
  Administered 2019-02-14 – 2019-02-15 (×3): 12 [IU] via SUBCUTANEOUS
  Filled 2019-02-14 (×5): qty 0.12

## 2019-02-14 MED ORDER — SODIUM CHLORIDE 0.9 % IV SOLN
500.0000 mg | Freq: Once | INTRAVENOUS | Status: AC
  Administered 2019-02-14: 500 mg via INTRAVENOUS
  Filled 2019-02-14: qty 500

## 2019-02-14 MED ORDER — ONDANSETRON HCL 4 MG PO TABS: 4.0000 mg | ORAL_TABLET | Freq: Four times a day (QID) | ORAL | Status: DC | PRN

## 2019-02-14 MED ORDER — METRONIDAZOLE IN NACL 5-0.79 MG/ML-% IV SOLN
500.0000 mg | Freq: Three times a day (TID) | INTRAVENOUS | Status: DC
  Administered 2019-02-14 – 2019-02-15 (×3): 500 mg via INTRAVENOUS
  Filled 2019-02-14 (×6): qty 100

## 2019-02-14 MED ORDER — DOCUSATE SODIUM 100 MG PO CAPS
100.0000 mg | ORAL_CAPSULE | Freq: Two times a day (BID) | ORAL | Status: DC
  Filled 2019-02-14: qty 1

## 2019-02-14 MED ORDER — INSULIN ASPART 100 UNIT/ML ~~LOC~~ SOLN
0.0000 [IU] | Freq: Three times a day (TID) | SUBCUTANEOUS | Status: DC
  Administered 2019-02-14: 2 [IU] via SUBCUTANEOUS
  Filled 2019-02-14 (×2): qty 1

## 2019-02-14 MED ORDER — SODIUM CHLORIDE 0.9 % IV SOLN
1.0000 g | INTRAVENOUS | Status: DC
  Administered 2019-02-15: 1 g via INTRAVENOUS
  Filled 2019-02-14: qty 10
  Filled 2019-02-14: qty 1

## 2019-02-14 MED ORDER — PANTOPRAZOLE SODIUM 40 MG PO TBEC
40.0000 mg | DELAYED_RELEASE_TABLET | Freq: Every day | ORAL | Status: DC
  Administered 2019-02-14 – 2019-02-15 (×2): 40 mg via ORAL
  Filled 2019-02-14 (×2): qty 1

## 2019-02-14 MED ORDER — DEXTROSE 50 % IV SOLN
1.0000 | Freq: Once | INTRAVENOUS | Status: AC
  Administered 2019-02-14: 50 mL via INTRAVENOUS
  Filled 2019-02-14: qty 50

## 2019-02-14 MED ORDER — AZITHROMYCIN 500 MG PO TABS: 500.0000 mg | ORAL_TABLET | Freq: Every day | ORAL | Status: DC

## 2019-02-14 NOTE — ED Notes (Signed)
Dr. Diamond in to see pt.  

## 2019-02-14 NOTE — H&P (Signed)
Gregory Moody is an 43 y.o. male.   Chief Complaint: Chest pain HPI: The patient with past medical history of diabetes presents to the emergency department complaining of chest pain.  The pain does not radiate.  He denies nausea, vomiting or diaphoresis.  This is been an ongoing problem.  The patient reports epigastric pain particularly when he eats.  He was seen at his outpatient physician prior to shelter in place orders for viral pandemic and referred for endoscopy due to what he describes as pill esophagitis.  He admits the pain has become too great to bear.  He denies cough, fever or shortness of breath.  Chest x-ray was concerning for convex opacity at the fissure.  CT scan shows bilateral lower lobe inflammation as well.  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  He was given ceftriaxone and azithromycin in the emergency department prior to the hospitalist service being called for admission.  Past Medical History:  Diagnosis Date  . Diabetes mellitus without complication ([place])   . Gastroparesis   . Tuberculosis     Past Surgical History:  Procedure Laterality Date  . ESOPHAGOGASTRODUODENOSCOPY N/A 02/03/2019   Procedure: ESOPHAGOGASTRODUODENOSCOPY (EGD);  Surgeon: Toledo, Benay Pike, MD;  Location: [facility] ENDOSCOPY;  Service: Gastroenterology;  Laterality: N/A;  . NO PAST SURGERIES      Family History  Problem Relation Age of Onset  . Diabetes Mother   . Diabetes Father    Social History:  reports that he has never smoked. He has never used smokeless tobacco. He reports that he does not drink alcohol or use drugs.  Allergies: No Known Allergies  Medications Prior to Admission  Medication Sig Dispense Refill  . FLUoxetine (PROZAC) 20 MG capsule Take 20 mg by mouth daily.    . insulin NPH-regular Human (70-30) 100 UNIT/ML injection 25 units with breakfast in am and 35 units with supper 33 mL 0  . insulin NPH-regular Human (NOVOLIN 70/30) (70-30) 100  UNIT/ML injection Inject 55 Units into the skin 2 (two) times daily before a meal. 33 mL 0  . omeprazole (PRILOSEC) 20 MG capsule Take 1 capsule (20 mg total) by mouth daily. 30 capsule 2    Results for orders placed or performed during the hospital encounter of 02/13/19 (from the past 48 hour(s))  Basic metabolic panel     Status: Abnormal   Collection Time: 02/13/19 10:13 PM  Result Value Ref Range   Sodium 134 (L) 135 - 145 mmol/L   Potassium 3.4 (L) 3.5 - 5.1 mmol/L   Chloride 103 98 - 111 mmol/L   CO2 24 22 - 32 mmol/L   Glucose, Bld 26 (LL) 70 - 99 mg/dL    Comment: CRITICAL RESULT CALLED TO, READ BACK BY AND VERIFIED WITH LISA THOMPSON AT 2244 ON 02/13/2019 [place].    BUN 24 (H) 6 - 20 mg/dL   Creatinine, Ser 1.62 (H) 0.61 - 1.24 mg/dL   Calcium 8.8 (L) 8.9 - 10.3 mg/dL   GFR calc non Af Amer 51 (L) >60 mL/min   GFR calc Af Amer 59 (L) >60 mL/min   Anion gap 7 5 - 15    Comment: Performed at [facility], [address] [place], [place] [address]  CBC     Status: Abnormal   Collection Time: 02/13/19 10:13 PM  Result Value Ref Range   WBC 10.9 (H) 4.0 - 10.5 K/uL   RBC 3.16 (L) 4.22 - 5.81 MIL/uL  Hemoglobin 9.4 (L) 13.0 - 17.0 g/dL   HCT 28.8 (L) 39.0 - 52.0 %   MCV 91.1 80.0 - 100.0 fL   MCH 29.7 26.0 - 34.0 pg   MCHC 32.6 30.0 - 36.0 g/dL   RDW 12.6 11.5 - 15.5 %   Platelets 286 150 - 400 K/uL   nRBC 0.0 0.0 - 0.2 %    Comment: Performed at [facility], [address] [place], [place] [address]  Troponin I - ONCE - STAT     Status: None   Collection Time: 02/13/19 10:13 PM  Result Value Ref Range   Troponin I <0.03 <0.03 ng/mL    Comment: Performed at [facility], [address] [place], [place] [address]  Hepatic function panel     Status: Abnormal   Collection Time: 02/13/19 10:13 PM  Result Value Ref Range   Total Protein 7.2 6.5 - 8.1 g/dL   Albumin 3.5 3.5 - 5.0 g/dL   AST 13 (L) 15 - 41 U/L   ALT 10 0 - 44 U/L   Alkaline  Phosphatase 93 38 - 126 U/L   Total Bilirubin 0.6 0.3 - 1.2 mg/dL   Bilirubin, Direct <0.1 0.0 - 0.2 mg/dL   Indirect Bilirubin NOT CALCULATED 0.3 - 0.9 mg/dL    Comment: Performed at [facility], [address] [place], [place] [address]  Lipase, blood     Status: None   Collection Time: 02/13/19 10:13 PM  Result Value Ref Range   Lipase 45 11 - 51 U/L    Comment: Performed at [facility], [address] [place], [place] [address]  Protime-INR     Status: None   Collection Time: 02/13/19 10:13 PM  Result Value Ref Range   Prothrombin Time 13.6 11.4 - 15.2 seconds   INR 1.1 0.8 - 1.2    Comment: (NOTE) INR goal varies based on device and disease states. Performed at [facility], [address] [place], [place] [address]   APTT     Status: None   Collection Time: 02/13/19 10:13 PM  Result Value Ref Range   aPTT 36 24 - 36 seconds    Comment: Performed at [facility], [place],  [address]  Glucose, capillary     Status: Abnormal   Collection Time: 02/13/19 11:45 PM  Result Value Ref Range   Glucose-Capillary 34 (LL) 70 - 99 mg/dL   Comment 1 Document in Chart    Comment 2 Call MD NNP PA CNM   SARS Coronavirus 2 (CEPHEID- Performed in [place] hospital lab), Hosp Order     Status: None   Collection Time: 02/13/19 11:56 PM  Result Value Ref Range   SARS Coronavirus 2 NEGATIVE NEGATIVE    Comment: (NOTE) If result is NEGATIVE SARS-CoV-2 target nucleic acids are NOT DETECTED. The SARS-CoV-2 RNA is generally detectable in upper and lower  respiratory specimens during the acute phase of infection. The lowest  concentration of SARS-CoV-2 viral copies this assay can detect is 250  copies / mL. A negative result does not preclude SARS-CoV-2 infection  and should not be used as the sole basis for treatment or other  patient management decisions.  A negative result may occur with  improper specimen collection /  handling, submission of specimen other  than nasopharyngeal swab, presence of viral mutation(s) within the  areas targeted by this assay, and inadequate number of viral copies  (<250 copies / mL).  A negative result must be combined with clinical  observations, patient history, and epidemiological information. If result is POSITIVE SARS-CoV-2 target nucleic acids are DETECTED. The SARS-CoV-2 RNA is generally detectable in upper and lower  respiratory specimens dur ing the acute phase of infection.  Positive  results are indicative of active infection with SARS-CoV-2.  Clinical  correlation with patient history and other diagnostic information is  necessary to determine patient infection status.  Positive results do  not rule out bacterial infection or co-infection with other viruses. If result is PRESUMPTIVE POSTIVE SARS-CoV-2 nucleic acids MAY BE PRESENT.   A presumptive positive result was obtained on the submitted specimen  and confirmed on repeat testing.  While 2019 novel coronavirus  (SARS-CoV-2) nucleic acids may be present in the submitted sample  additional confirmatory testing may be necessary for epidemiological  and / or clinical management purposes  to differentiate between  SARS-CoV-2 and other Sarbecovirus currently known to infect humans.  If clinically indicated additional testing with an alternate test  methodology [id number]) is advised. The SARS-CoV-2 RNA is generally  detectable in upper and lower respiratory sp ecimens during the acute  phase of infection. The expected result is Negative. Fact Sheet for Patients:  StrictlyIdeas.no Fact Sheet for Healthcare Providers: BankingDealers.co.za This test is not yet approved or cleared by the [place] FDA and has been authorized for detection and/or diagnosis of SARS-CoV-2 by FDA under an Emergency Use Authorization (EUA).  This EUA will remain in effect (meaning this  test can be used) for the duration of the COVID-19 declaration under Section 564(b)(1) of the Act, 21 U.S.C. section 360bbb-3(b)(1), unless the authorization is terminated or revoked sooner. Performed at [facility], [address] [place], [place] [address]   Glucose, capillary     Status: Abnormal   Collection Time: 02/14/19 12:46 AM  Result Value Ref Range   Glucose-Capillary 50 (L) 70 - 99 mg/dL  Lactic acid, plasma     Status: None   Collection Time: 02/14/19  1:05 AM  Result Value Ref Range   Lactic Acid, Venous 1.4 0.5 - 1.9 mmol/L    Comment: Performed at [facility], [address] [place], [place] [address]  Troponin I - ONCE - STAT     Status: None   Collection Time: 02/14/19  1:05 AM  Result Value Ref Range   Troponin I <0.03 <0.03 ng/mL    Comment: Performed at [facility], [place], [place] [address]  Glucose, capillary     Status: Abnormal   Collection Time: 02/14/19  1:59 AM  Result Value Ref Range   Glucose-Capillary 24 (LL) 70 - 99 mg/dL   Comment 1 Document in Chart    Comment 2 Call MD NNP PA CNM   Glucose, capillary     Status: Abnormal   Collection Time: 02/14/19  2:56 AM  Result Value Ref Range   Glucose-Capillary 179 (H) 70 - 99 mg/dL  Glucose, capillary     Status: Abnormal   Collection Time: 02/14/19  4:13 AM  Result Value Ref Range   Glucose-Capillary 187 (H) 70 - 99 mg/dL  Glucose, capillary     Status: Abnormal   Collection Time: 02/14/19  6:08 AM  Result Value Ref Range   Glucose-Capillary 124 (H) 70 - 99 mg/dL   Ct Chest Wo Contrast  Result Date: 02/14/2019 CLINICAL DATA:  43 y/o M; midsternal chest pain since yesterday. Shortness of breath. History of tuberculosis. EXAM: CT  CHEST WITHOUT CONTRAST TECHNIQUE: Multidetector CT imaging of the chest was performed following the standard protocol without IV contrast. COMPARISON:  02/13/2019 chest radiograph. 04/06/2016 CT chest. FINDINGS:  Cardiovascular: No significant vascular findings. Normal heart size. No pericardial effusion. Mediastinum/Nodes: Calcified right paratracheal mediastinal lymph nodes compatible. No mediastinal or axillary lymphadenopathy. Normal thyroid gland. Esophagus is unremarkable. Lungs/Pleura: Small ground-glass opacities are present within the bilateral lower lobes in a bronchovascular distribution as well as tree-in-bud opacities. There is bronchiectasis within the anterior aspect the right upper lobe with coarse interstitial changes likely representing sequelae of prior pneumonia. Additionally, there is a masslike opacity within the periphery of right upper lobe measuring 3.9 x 3.2 x 2.5 cm corresponding to the region of consolidation on prior CT of chest and multiple interval chest radiographs. The lesion is well-circumscribed. Upper Abdomen: No acute abnormality. Musculoskeletal: No acute osseous abnormality. Chronic fracture deformity of the left medial clavicle. IMPRESSION: 1. Acute bronchiolitis in the lower lobes, probably infectious or possibly related to aspiration given distribution. This pattern can be seen with mycobacterial infection including tuberculosis which should be excluded given patient history. 2. Lesion in the periphery of the right upper lobe is present on multiple interval chest radiographs from the prior CT. 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 Consider 3-6 month follow-up CT of the chest to ensure stability. 3. Calcified mediastinal lymph nodes. No mediastinal lymphadenopathy. These results were called by telephone at the time of interpretation on 02/14/2019 at 12:00 am to Dr. Hinda Kehr , who verbally acknowledged these results. Electronically Signed   By: Kristine Garbe M.D.   On: 02/14/2019 00:13   Dg Chest Port 1 View  Result Date: 02/13/2019 CLINICAL DATA:  Chest pain EXAM: PORTABLE CHEST 1 VIEW COMPARISON:  12/14/2018, 06/20/2018, 02/05/2018, 08/12/2017 FINDINGS: Focal ovoid  opacity in the right upper lobe adjacent to the fissure, intermittently visualized on comparison studies, appears marked convex in configuration. Possible early infiltrate at the left base. No pleural effusion. Normal heart size. No pneumothorax. IMPRESSION: 1. Possible early infiltrate at the left base 2. Focal ovoid opacity in the right upper lobe adjacent to the fissure, slight increased convex morphology on today's study, raises possibility of acute on chronic process. Electronically Signed   By: Donavan Foil M.D.   On: 02/13/2019 22:52    Review of Systems  Constitutional: Negative for chills and fever.  HENT: Negative for sore throat and tinnitus.   Eyes: Negative for blurred vision and redness.  Respiratory: Negative for cough and shortness of breath.   Cardiovascular: Positive for chest pain. Negative for palpitations, orthopnea and PND.  Gastrointestinal: Negative for abdominal pain, diarrhea, nausea and vomiting.  Genitourinary: Negative for dysuria, frequency and urgency.  Musculoskeletal: Negative for joint pain and myalgias.  Skin: Negative for rash.       No lesions  Neurological: Negative for speech change, focal weakness and weakness.  Endo/Heme/Allergies: Does not bruise/bleed easily.       No temperature intolerance  Psychiatric/Behavioral: Negative for depression and suicidal ideas.    Blood pressure 112/64, pulse 80, temperature 97.8 F (36.6 C), temperature source Oral, resp. rate 18, height 5\' 8"  (1.727 m), weight 58.5 kg, SpO2 100 %. Physical Exam  Vitals reviewed. Constitutional: He is oriented to person, place, and time. He appears well-developed and well-nourished. No distress.  HENT:  Head: Normocephalic and atraumatic.  Mouth/Throat: Oropharynx is clear and moist.  Eyes: Pupils are equal, round, and reactive to light. Conjunctivae and EOM are normal. No scleral  icterus.  Neck: Normal range of motion. Neck supple. No JVD present. No tracheal deviation present.  No thyromegaly present.  Cardiovascular: Normal rate, regular rhythm and normal heart sounds. Exam reveals no gallop and no friction rub.  No murmur heard. Respiratory: Effort normal and breath sounds normal. No respiratory distress.  GI: Soft. Bowel sounds are normal. He exhibits no distension. There is no abdominal tenderness.  Genitourinary:    Genitourinary Comments: Deferred   Musculoskeletal: Normal range of motion.        General: No edema.  Lymphadenopathy:    He has no cervical adenopathy.  Neurological: He is alert and oriented to person, place, and time. No cranial nerve deficit.  Skin: Skin is warm and dry. No rash noted. No erythema.  Psychiatric: He has a normal mood and affect. His behavior is normal. Judgment and thought content normal.     Assessment/Plan This is a 43 year old male admitted for pneumonia. 1.  Pneumonia: Presumably community-acquired, however the patient has had TB and was treated in the past but has radiographic findings concerning for current infection or reactivation of tuberculosis.  Continue azithromycin and ceftriaxone.  The patient is in airborne isolation while we rule out TB.  2.  Hypoglycemia: The patient reports eating well but I suspect he has a decreased appetite due to increased work of breathing and overall wasting, perhaps due to TB.  I have given the patient saline with dextrose and potassium. 3.  AKI: Superimposed upon chronic kidney disease.  Hydrate with intravenous fluid.  Avoid nephrotoxic agents. 4.  Exposure to TB: Obtain early morning sputum and QuantiFERON gold.  Consult infectious disease 5.  DVT prophylaxis: Heparin 6.  GI prophylaxis: PPI per home regimen The patient is a full code.  Time spent on admission orders and patient care approximately 45 minutes  Harrie Foreman, MD 02/14/2019, 6:38 AM

## 2019-02-14 NOTE — ED Notes (Signed)
MD notified of FSBS; orders obtained; sandwich tray and 8oz OJ given to pt with instructions to eat

## 2019-02-14 NOTE — Progress Notes (Addendum)
Inpatient Diabetes Program Recommendations  AACE/ADA: New Consensus Statement on Inpatient Glycemic Control (2015)  Target Ranges:  Prepandial:   less than 140 mg/dL      Peak postprandial:   less than 180 mg/dL (1-2 hours)      Critically ill patients:  140 - 180 mg/dL   Lab Results  Component Value Date   GLUCAP 313 (H) 02/14/2019   HGBA1C 8.7 (H) 02/02/2019    Review of Glycemic Control  Diabetes history: DM 2 Outpatient Diabetes medications: 70/30 25 units qam, 35 units qpm Current orders for Inpatient glycemic control: None  A1c 8.7% on 4/24  Inpatient Diabetes Program Recommendations:    Coming in with CP/ r/o TB/PNA. MD speculates poor po intake prior to admission was placed on D10 infusion. Now on D5 1/2 NS. Carb modified diet ordered. Patient consumed 100% of breakfast.  Consider Levemir 12 units BID, Novolog 0-9 units tid + Novolog 0-5 units qhs.   Will watch for now to determine if meal coverage would be appropriate as well.  Sent a secure chat message to Dr. Bridgett Larsson.  Thanks,  Tama Headings RN, MSN, BC-ADM Inpatient Diabetes Coordinator Team Pager [phone] (8a-5p)

## 2019-02-14 NOTE — ED Notes (Signed)
ED TO INPATIENT HANDOFF REPORT  ED Nurse Name and Phone #: Lattie Haw [phone]  S Name/Age/Gender Gregory Moody 43 y.o. male Room/Bed: ED01A/ED01A  Code Status   Code Status: Prior  Home/SNF/Other Home Patient oriented to: self, place, time and situation Is this baseline? Yes   Triage Complete: Triage complete  Chief Complaint chest pain  Triage Note Patient reports mid-sternal chest pain since yesterday.Patient c/o being nauseous and diaphoretic.  Patient also c/o being short of breath. Patient was seen two weeks ago for same symptoms.   Allergies No Known Allergies  Level of Care/Admitting Diagnosis ED Disposition    ED Disposition Condition [facility] Area: [place] [100120]  Level of Care: Med-Surg [16]  Covid Evaluation: Screening Protocol (No Symptoms)  Diagnosis: CAP (community acquired pneumonia) [035009]  Admitting Physician: Harrie Foreman [[id number]]  Attending Physician: Harrie Foreman [[id number]]  Estimated length of stay: past midnight tomorrow  Certification:: I certify this patient will need inpatient services for at least 2 midnights  PT Class (Do Not Modify): Inpatient [101]  PT Acc Code (Do Not Modify): Private [1]       B Medical/Surgery History Past Medical History:  Diagnosis Date  . Diabetes mellitus without complication ([place])   . Gastroparesis   . Tuberculosis    Past Surgical History:  Procedure Laterality Date  . ESOPHAGOGASTRODUODENOSCOPY N/A 02/03/2019   Procedure: ESOPHAGOGASTRODUODENOSCOPY (EGD);  Surgeon: Toledo, Benay Pike, MD;  Location: [facility] ENDOSCOPY;  Service: Gastroenterology;  Laterality: N/A;  . NO PAST SURGERIES       A IV Location/Drains/Wounds Patient Lines/Drains/Airways Status   Active Line/Drains/Airways    Name:   Placement date:   Placement time:   Site:   Days:   Peripheral IV 02/13/19 Left Antecubital   02/13/19    2250    Antecubital   1           Intake/Output Last 24 hours  Intake/Output Summary (Last 24 hours) at 02/14/2019 0323 Last data filed at 02/14/2019 0144 Gross per 24 hour  Intake 100 ml  Output -  Net 100 ml    Labs/Imaging Results for orders placed or performed during the hospital encounter of 02/13/19 (from the past 48 hour(s))  Basic metabolic panel     Status: Abnormal   Collection Time: 02/13/19 10:13 PM  Result Value Ref Range   Sodium 134 (L) 135 - 145 mmol/L   Potassium 3.4 (L) 3.5 - 5.1 mmol/L   Chloride 103 98 - 111 mmol/L   CO2 24 22 - 32 mmol/L   Glucose, Bld 26 (LL) 70 - 99 mg/dL    Comment: CRITICAL RESULT CALLED TO, READ BACK BY AND VERIFIED WITH Delaila Nand AT 2244 ON 02/13/2019 [place].    BUN 24 (H) 6 - 20 mg/dL   Creatinine, Ser 1.62 (H) 0.61 - 1.24 mg/dL   Calcium 8.8 (L) 8.9 - 10.3 mg/dL   GFR calc non Af Amer 51 (L) >60 mL/min   GFR calc Af Amer 59 (L) >60 mL/min   Anion gap 7 5 - 15    Comment: Performed at [facility], [address] [place], [place] [address]  CBC     Status: Abnormal   Collection Time: 02/13/19 10:13 PM  Result Value Ref Range   WBC 10.9 (H) 4.0 - 10.5 K/uL   RBC 3.16 (L) 4.22 - 5.81 MIL/uL   Hemoglobin 9.4 (L) 13.0 - 17.0 g/dL   HCT 28.8 (L) 39.0 -  52.0 %   MCV 91.1 80.0 - 100.0 fL   MCH 29.7 26.0 - 34.0 pg   MCHC 32.6 30.0 - 36.0 g/dL   RDW 12.6 11.5 - 15.5 %   Platelets 286 150 - 400 K/uL   nRBC 0.0 0.0 - 0.2 %    Comment: Performed at [facility], [address] [place], [place] [address]  Troponin I - ONCE - STAT     Status: None   Collection Time: 02/13/19 10:13 PM  Result Value Ref Range   Troponin I <0.03 <0.03 ng/mL    Comment: Performed at [facility], [address] [place], [place] [address]  Hepatic function panel     Status: Abnormal   Collection Time: 02/13/19 10:13 PM  Result Value Ref Range   Total Protein 7.2 6.5 - 8.1 g/dL   Albumin 3.5 3.5 - 5.0 g/dL   AST 13 (L) 15 - 41 U/L   ALT 10 0 - 44 U/L    Alkaline Phosphatase 93 38 - 126 U/L   Total Bilirubin 0.6 0.3 - 1.2 mg/dL   Bilirubin, Direct <0.1 0.0 - 0.2 mg/dL   Indirect Bilirubin NOT CALCULATED 0.3 - 0.9 mg/dL    Comment: Performed at [facility], [address] [place], [place] [address]  Lipase, blood     Status: None   Collection Time: 02/13/19 10:13 PM  Result Value Ref Range   Lipase 45 11 - 51 U/L    Comment: Performed at [facility], [address] [place], [place] [address]  Protime-INR     Status: None   Collection Time: 02/13/19 10:13 PM  Result Value Ref Range   Prothrombin Time 13.6 11.4 - 15.2 seconds   INR 1.1 0.8 - 1.2    Comment: (NOTE) INR goal varies based on device and disease states. Performed at [facility], [address] [place], [place] [address]   APTT     Status: None   Collection Time: 02/13/19 10:13 PM  Result Value Ref Range   aPTT 36 24 - 36 seconds    Comment: Performed at [facility], [place], [place] [address]  Glucose, capillary     Status: Abnormal   Collection Time: 02/13/19 11:45 PM  Result Value Ref Range   Glucose-Capillary 34 (LL) 70 - 99 mg/dL   Comment 1 Document in Chart    Comment 2 Call MD NNP PA CNM   SARS Coronavirus 2 (CEPHEID- Performed in [place] hospital lab), Hosp Order     Status: None   Collection Time: 02/13/19 11:56 PM  Result Value Ref Range   SARS Coronavirus 2 NEGATIVE NEGATIVE    Comment: (NOTE) If result is NEGATIVE SARS-CoV-2 target nucleic acids are NOT DETECTED. The SARS-CoV-2 RNA is generally detectable in upper and lower  respiratory specimens during the acute phase of infection. The lowest  concentration of SARS-CoV-2 viral copies this assay can detect is 250  copies / mL. A negative result does not preclude SARS-CoV-2 infection  and should not be used as the sole basis for treatment or other  patient management decisions.  A negative result may occur with  improper specimen  collection / handling, submission of specimen other  than nasopharyngeal swab, presence of viral mutation(s) within the  areas targeted by this assay, and inadequate number of viral copies  (<250 copies / mL). A negative result must be combined with clinical  observations, patient history, and epidemiological  information. If result is POSITIVE SARS-CoV-2 target nucleic acids are DETECTED. The SARS-CoV-2 RNA is generally detectable in upper and lower  respiratory specimens dur ing the acute phase of infection.  Positive  results are indicative of active infection with SARS-CoV-2.  Clinical  correlation with patient history and other diagnostic information is  necessary to determine patient infection status.  Positive results do  not rule out bacterial infection or co-infection with other viruses. If result is PRESUMPTIVE POSTIVE SARS-CoV-2 nucleic acids MAY BE PRESENT.   A presumptive positive result was obtained on the submitted specimen  and confirmed on repeat testing.  While 2019 novel coronavirus  (SARS-CoV-2) nucleic acids may be present in the submitted sample  additional confirmatory testing may be necessary for epidemiological  and / or clinical management purposes  to differentiate between  SARS-CoV-2 and other Sarbecovirus currently known to infect humans.  If clinically indicated additional testing with an alternate test  methodology [phone]) is advised. The SARS-CoV-2 RNA is generally  detectable in upper and lower respiratory sp ecimens during the acute  phase of infection. The expected result is Negative. Fact Sheet for Patients:  StrictlyIdeas.no Fact Sheet for Healthcare Providers: BankingDealers.co.za This test is not yet approved or cleared by the [place] FDA and has been authorized for detection and/or diagnosis of SARS-CoV-2 by FDA under an Emergency Use Authorization (EUA).  This EUA will remain in effect  (meaning this test can be used) for the duration of the COVID-19 declaration under Section 564(b)(1) of the Act, 21 U.S.C. section 360bbb-3(b)(1), unless the authorization is terminated or revoked sooner. Performed at [facility], [address] [place], [place] [address]   Glucose, capillary     Status: Abnormal   Collection Time: 02/14/19 12:46 AM  Result Value Ref Range   Glucose-Capillary 50 (L) 70 - 99 mg/dL  Lactic acid, plasma     Status: None   Collection Time: 02/14/19  1:05 AM  Result Value Ref Range   Lactic Acid, Venous 1.4 0.5 - 1.9 mmol/L    Comment: Performed at [facility], [place], [place] [address]  Troponin I - ONCE - STAT     Status: None   Collection Time: 02/14/19  1:05 AM  Result Value Ref Range   Troponin I <0.03 <0.03 ng/mL    Comment: Performed at [facility], [place], [place] [address]  Glucose, capillary     Status: Abnormal   Collection Time: 02/14/19  1:59 AM  Result Value Ref Range   Glucose-Capillary 24 (LL) 70 - 99 mg/dL   Comment 1 Document in Chart    Comment 2 Call MD NNP PA CNM   Glucose, capillary     Status: Abnormal   Collection Time: 02/14/19  2:56 AM  Result Value Ref Range   Glucose-Capillary 179 (H) 70 - 99 mg/dL   Ct Chest Wo Contrast  Result Date: 02/14/2019 CLINICAL DATA:  43 y/o M; midsternal chest pain since yesterday. Shortness of breath. History of tuberculosis. EXAM: CT CHEST WITHOUT CONTRAST TECHNIQUE: Multidetector CT imaging of the chest was performed following the standard protocol without IV contrast. COMPARISON:  02/13/2019 chest radiograph. 04/06/2016 CT chest. FINDINGS: Cardiovascular: No significant vascular findings. Normal heart size. No pericardial effusion. Mediastinum/Nodes: Calcified right paratracheal mediastinal lymph nodes compatible. No mediastinal or axillary lymphadenopathy. Normal thyroid gland. Esophagus is unremarkable. Lungs/Pleura: Small  ground-glass opacities are present within the bilateral lower lobes in a bronchovascular distribution as well  as tree-in-bud opacities. There is bronchiectasis within the anterior aspect the right upper lobe with coarse interstitial changes likely representing sequelae of prior pneumonia. Additionally, there is a masslike opacity within the periphery of right upper lobe measuring 3.9 x 3.2 x 2.5 cm corresponding to the region of consolidation on prior CT of chest and multiple interval chest radiographs. The lesion is well-circumscribed. Upper Abdomen: No acute abnormality. Musculoskeletal: No acute osseous abnormality. Chronic fracture deformity of the left medial clavicle. IMPRESSION: 1. Acute bronchiolitis in the lower lobes, probably infectious or possibly related to aspiration given distribution. This pattern can be seen with mycobacterial infection including tuberculosis which should be excluded given patient history. 2. Lesion in the periphery of the right upper lobe is present on multiple interval chest radiographs from the prior CT. 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 Consider 3-6 month follow-up CT of the chest to ensure stability. 3. Calcified mediastinal lymph nodes. No mediastinal lymphadenopathy. These results were called by telephone at the time of interpretation on 02/14/2019 at 12:00 am to Dr. Hinda Kehr , who verbally acknowledged these results. Electronically Signed   By: Gregory Garbe M.D.   On: 02/14/2019 00:13   Dg Chest Port 1 View  Result Date: 02/13/2019 CLINICAL DATA:  Chest pain EXAM: PORTABLE CHEST 1 VIEW COMPARISON:  12/14/2018, 06/20/2018, 02/05/2018, 08/12/2017 FINDINGS: Focal ovoid opacity in the right upper lobe adjacent to the fissure, intermittently visualized on comparison studies, appears marked convex in configuration. Possible early infiltrate at the left base. No pleural effusion. Normal heart size. No pneumothorax. IMPRESSION: 1. Possible early infiltrate at  the left base 2. Focal ovoid opacity in the right upper lobe adjacent to the fissure, slight increased convex morphology on today's study, raises possibility of acute on chronic process. Electronically Signed   By: Donavan Foil M.D.   On: 02/13/2019 22:52    Pending Labs Unresulted Labs (From admission, onward)    Start     Ordered   02/14/19 0058  Blood culture (routine x 2)  BLOOD CULTURE X 2,   STAT     02/14/19 0057   Signed and Held  Creatinine, serum  (enoxaparin (LOVENOX)    CrCl >/= 30 ml/min)  Weekly,   R    Comments:  while on enoxaparin therapy    Signed and Held   Signed and Held  TSH  Add-on,   R     Signed and Held          Vitals/Pain Today's Vitals   02/13/19 2152 02/13/19 2203  BP: 99/66   Pulse: 71   Resp: 18   Temp: 97.7 F (36.5 C)   TempSrc: Oral   SpO2: 100%   Weight: 59 kg   Height: 5\' 8"  (1.727 m)   PainSc:  8     Isolation Precautions Airborne precautions  Medications Medications  dextrose 10 % 1,000 mL with sodium chloride 0.45 %, potassium chloride 40 mEq/L infusion ( Intravenous New Bag/Given 02/14/19 0044)  dextrose 50 % solution 50 mL (50 mLs Intravenous Given 02/13/19 2251)  dextrose 50 % solution 50 mL (50 mLs Intravenous Given 02/13/19 2351)  cefTRIAXone (ROCEPHIN) 1 g in sodium chloride 0.9 % 100 mL IVPB (0 g Intravenous Stopped 02/14/19 0144)  azithromycin (ZITHROMAX) 500 mg in sodium chloride 0.9 % 250 mL IVPB (500 mg Intravenous New Bag/Given 02/14/19 0157)  dextrose 50 % solution 50 mL (50 mLs Intravenous Given 02/14/19 0206)    Mobility walks with device  Moderate fall  risk   Focused Assessments    R Recommendations: See Admitting Provider Note  Report given to:   Additional Notes:  none

## 2019-02-14 NOTE — ED Notes (Signed)
Pt moved to isolation room 1 per MD request; pt voices good understanding of isolation precautions; pt cont to deny any recent illness, inc fever/cough/SHOB or congestion

## 2019-02-14 NOTE — ED Notes (Signed)
Attempted to call report to floor, nurse will return call when ready

## 2019-02-14 NOTE — Consult Note (Addendum)
NAME: Gregory Moody  DOB: 03-24-1976  MRN: [id number]  Date/Time: 02/14/2019 12:02 PM  REQUESTING PROVIDER Marcille Blanco Subjective:  REASON FOR CONSULT: TB reactivation ? Gregory Moody is a 43 y.o. hispanic male with a history of DM, (Brittle) , DKA, gastroparesis treated TB in 2017 for 6 months  Presents with  midsternal CP and SOB of sudden onset. Pt says he was eating fish on Monday  and was eating fast and a lot and felt that it was stuck in his mid chest and he was having pain. As he was not able to get rid of it he came to the ED after 24 hrs.  He denied any cough, vomiting, fever or chills. No night sweats. He had similar presentation to the hospital twice this year in March and April and during those times he was also hypotensive and was in DKA, ARF. He underwent upper Gi endoscopy on 4/25 and found to have erosive esophagitis and hiatus hernia  In 2017 pt was admitted to [facility] between 8/9/-05/21/16 for abnormal CXR and suspicion for TB and  underwent bronchoscopy on 8/10.  and BAL  culture positive for MTB .HE was treated by [facility] with RIPE and completed 6 months of treatment. In May 2018 he was admitted to [facility] and worked up for chronic diarrhea and weight loss and the extensive work up revealed positive Helicobacter pylori , Positive strongyloides ab for which he was treated with IVERMECTIN. He had coloscopy/endoscopy, HIV, HTLV1 were all neg- cortisol level was okay. It was thought to be due to DM. Pt since then has gained 13 Kgs ( on chart examination)   Pt now denies any cough, fever, night sweats, or diarrhea. Says he does not drink alcohol any more- lives with his sister.  In the ED his vitals were BP 99/66, 97.7, RR 18 He had a CT chest which showed Acute bronchiolitis in the lower lobes, probably infectious or possibly related to aspiration given distribution. This pattern can be seen with mycobacterial infection including tuberculosis which should be excluded given patient  history. 2. Lesion in the periphery of the right upper lobe is present on multiple interval chest radiographs from the prior CT. 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   I am asked to see the patient for possible reactivation of TB  Past Medical History:  Diagnosis Date  . Diabetes mellitus without complication ([place])   . Gastroparesis   . Tuberculosis    erosive esophagitis Past Surgical History:  Procedure Laterality Date  . ESOPHAGOGASTRODUODENOSCOPY N/A 02/03/2019   Procedure: ESOPHAGOGASTRODUODENOSCOPY (EGD);  Surgeon: Toledo, Benay Pike, MD;  Location: [facility] ENDOSCOPY;  Service: Gastroenterology;  Laterality: N/A;  . NO PAST SURGERIES      Social History   Socioeconomic History  . Marital status: Single    Spouse name: Not on file  . Number of children: Not on file  . Years of education: Not on file  . Highest education level: Not on file  Occupational History  . Not on file  Social Needs  . Financial resource strain: Not on file  . Food insecurity:    Worry: Not on file    Inability: Not on file  . Transportation needs:    Medical: Not on file    Non-medical: Not on file  Tobacco Use  . Smoking status: Never Smoker  . Smokeless tobacco: Never Used  Substance and Sexual Activity  . Alcohol use: No  . Drug use: No  . Sexual activity: Not on file  Lifestyle  . Physical activity:    Days per week: Not on file    Minutes per session: Not on file  . Stress: Not on file  Relationships  . Social connections:    Talks on phone: Not on file    Gets together: Not on file    Attends religious service: Not on file    Active member of club or organization: Not on file    Attends meetings of clubs or organizations: Not on file    Relationship status: Not on file  . Intimate partner violence:    Fear of current or ex partner: Not on file    Emotionally abused: Not on file    Physically abused: Not on file    Forced sexual activity: Not on file  Other Topics Concern  .  Not on file  Social History Narrative  . Not on file    Family History  Problem Relation Age of Onset  . Diabetes Mother   . Diabetes Father    No Known Allergies  ? Current Facility-Administered Medications  Medication Dose Route Frequency Provider Last Rate Last Dose  . acetaminophen (TYLENOL) tablet 650 mg  650 mg Oral Q6H PRN Harrie Foreman, MD       Or  . acetaminophen (TYLENOL) suppository 650 mg  650 mg Rectal Q6H PRN Harrie Foreman, MD      . Derrill Memo ON 02/15/2019] azithromycin (ZITHROMAX) 250 mg in dextrose 5 % 125 mL IVPB  250 mg Intravenous Q24H Harrie Foreman, MD      . Derrill Memo ON 02/15/2019] cefTRIAXone (ROCEPHIN) 1 g in sodium chloride 0.9 % 100 mL IVPB  1 g Intravenous Q24H Harrie Foreman, MD      . dextrose 5 %-0.45 % sodium chloride infusion   Intravenous Continuous Charlett Nose, RPH      . docusate sodium (COLACE) capsule 100 mg  100 mg Oral BID Harrie Foreman, MD      . Derrill Memo ON 02/15/2019] enoxaparin (LOVENOX) injection 40 mg  40 mg Subcutaneous Q24H Charlett Nose, RPH      . ondansetron [facility]) tablet 4 mg  4 mg Oral Q6H PRN Harrie Foreman, MD       Or  . ondansetron [facility]) injection 4 mg  4 mg Intravenous Q6H PRN Harrie Foreman, MD      . pantoprazole (PROTONIX) EC tablet 40 mg  40 mg Oral Daily Harrie Foreman, MD   40 mg at 02/14/19 7628     Abtx:  Anti-infectives (From admission, onward)   Start     Dose/Rate Route Frequency Ordered Stop   02/15/19 1000  azithromycin (ZITHROMAX) 250 mg in dextrose 5 % 125 mL IVPB     250 mg 125 mL/hr over 60 Minutes Intravenous Every 24 hours 02/14/19 0750     02/15/19 1000  cefTRIAXone (ROCEPHIN) 1 g in sodium chloride 0.9 % 100 mL IVPB     1 g 200 mL/hr over 30 Minutes Intravenous Every 24 hours 02/14/19 0750     02/14/19 0045  cefTRIAXone (ROCEPHIN) 1 g in sodium chloride 0.9 % 100 mL IVPB     1 g 200 mL/hr over 30 Minutes Intravenous STAT 02/14/19 0044 02/14/19 0144   02/14/19  0045  azithromycin (ZITHROMAX) 500 mg in sodium chloride 0.9 % 250 mL IVPB     500 mg 250 mL/hr over 60 Minutes Intravenous  Once 02/14/19 0044 02/14/19 0257  REVIEW OF SYSTEMS:  Const: negative fever, negative chills, negative weight loss Eyes: negative diplopia or visual changes, negative eye pain ENT: negative coryza, negative sore throat Resp: negative cough, hemoptysis, ++dyspnea Cards: ++ chest pain, no palpitations, lower extremity edema GU: negative for frequency, dysuria and hematuria GI: Negative for abdominal pain, has intermittent diarrhea, no bleeding, constipation Skin: negative for rash and pruritus Heme: negative for easy bruising and gum/nose bleeding MS: negative for myalgias, arthralgias, back pain and muscle weakness Neurolo:negative for headaches, dizziness, vertigo, memory problems  Psych: negative for feelings of anxiety, depression  Endocrine: negative for thyroid, diabetes Allergy/Immunology- negative for any medication or food allergies ? Objective:  VITALS:  BP 112/64 (BP Location: Right Arm)   Pulse 78   Temp 97.8 F (36.6 C) (Oral)   Resp 15   Ht 5\' 8"  (1.727 m)   Wt 58.5 kg   SpO2 100%   BMI 19.61 kg/m  PHYSICAL EXAM:  General: Alert, cooperative, no distress, appears stated age. Thin, chronically ill Head: Normocephalic, without obvious abnormality, atraumatic. Eyes: Conjunctivae clear, anicteric sclerae. Pupils are equal ENT Nares normal. No drainage or sinus tenderness. Lips, mucosa, and tongue normal. No Thrush Neck: Supple, symmetrical, no adenopathy, thyroid: non tender no carotid bruit and no JVD. Left clavicular bone near the sternoclavicular area- very prominent  Back: No CVA tenderness. Lungs: b/l air entry- few crepts both sides Heart: Regular rate and rhythm, no murmur, rub or gallop. Abdomen: Soft, non-tender,not distended. Bowel sounds normal. No masses Extremities: atraumatic, no cyanosis. No edema. No clubbing, toe  nails- onychomycosis Skin: No rashes or lesions. Or bruising Lymph: Cervical, supraclavicular normal. Neurologic: Grossly non-focal Pertinent Labs Lab Results CBC    Component Value Date/Time   WBC 10.9 (H) 02/13/2019 2213   RBC 3.16 (L) 02/13/2019 2213   HGB 9.4 (L) 02/13/2019 2213   HCT 28.8 (L) 02/13/2019 2213   PLT 286 02/13/2019 2213   MCV 91.1 02/13/2019 2213   MCH 29.7 02/13/2019 2213   MCHC 32.6 02/13/2019 2213   RDW 12.6 02/13/2019 2213   LYMPHSABS 0.7 (L) 06/20/2018 2148   MONOABS 0.2 06/20/2018 2148   EOSABS 0.0 06/20/2018 2148   BASOSABS 0.0 06/20/2018 2148    CMP Latest Ref Rng & Units 02/13/2019 02/03/2019 02/02/2019  Glucose 70 - 99 mg/dL 26(LL) 142(H) 331(H)  BUN 6 - 20 mg/dL 24(H) 24(H) 31(H)  Creatinine 0.61 - 1.24 mg/dL 1.62(H) 1.50(H) 2.28(H)  Sodium 135 - 145 mmol/L 134(L) 136 134(L)  Potassium 3.5 - 5.1 mmol/L 3.4(L) 3.5 4.3  Chloride 98 - 111 mmol/L 103 104 95(L)  CO2 22 - 32 mmol/L 24 20(L) 23  Calcium 8.9 - 10.3 mg/dL 8.8(L) 8.3(L) 9.1  Total Protein 6.5 - 8.1 g/dL 7.2 - 8.6(H)  Total Bilirubin 0.3 - 1.2 mg/dL 0.6 - 1.2  Alkaline Phos 38 - 126 U/L 93 - 125  AST 15 - 41 U/L 13(L) - 12(L)  ALT 0 - 44 U/L 10 - 8      Microbiology: Recent Results (from the past 240 hour(s))  SARS Coronavirus 2 (CEPHEID- Performed in [place] hospital lab), Hosp Order     Status: None   Collection Time: 02/13/19 11:56 PM  Result Value Ref Range Status   SARS Coronavirus 2 NEGATIVE NEGATIVE Final    Comment: (NOTE) If result is NEGATIVE SARS-CoV-2 target nucleic acids are NOT DETECTED. The SARS-CoV-2 RNA is generally detectable in upper and lower  respiratory specimens during the acute phase of infection. The  lowest  concentration of SARS-CoV-2 viral copies this assay can detect is 250  copies / mL. A negative result does not preclude SARS-CoV-2 infection  and should not be used as the sole basis for treatment or other  patient management decisions.  A  negative result may occur with  improper specimen collection / handling, submission of specimen other  than nasopharyngeal swab, presence of viral mutation(s) within the  areas targeted by this assay, and inadequate number of viral copies  (<250 copies / mL). A negative result must be combined with clinical  observations, patient history, and epidemiological information. If result is POSITIVE SARS-CoV-2 target nucleic acids are DETECTED. The SARS-CoV-2 RNA is generally detectable in upper and lower  respiratory specimens dur ing the acute phase of infection.  Positive  results are indicative of active infection with SARS-CoV-2.  Clinical  correlation with patient history and other diagnostic information is  necessary to determine patient infection status.  Positive results do  not rule out bacterial infection or co-infection with other viruses. If result is PRESUMPTIVE POSTIVE SARS-CoV-2 nucleic acids MAY BE PRESENT.   A presumptive positive result was obtained on the submitted specimen  and confirmed on repeat testing.  While 2019 novel coronavirus  (SARS-CoV-2) nucleic acids may be present in the submitted sample  additional confirmatory testing may be necessary for epidemiological  and / or clinical management purposes  to differentiate between  SARS-CoV-2 and other Sarbecovirus currently known to infect humans.  If clinically indicated additional testing with an alternate test  methodology [phone]) is advised. The SARS-CoV-2 RNA is generally  detectable in upper and lower respiratory sp ecimens during the acute  phase of infection. The expected result is Negative. Fact Sheet for Patients:  StrictlyIdeas.no Fact Sheet for Healthcare Providers: BankingDealers.co.za This test is not yet approved or cleared by the [place] FDA and has been authorized for detection and/or diagnosis of SARS-CoV-2 by FDA under an Emergency Use  Authorization (EUA).  This EUA will remain in effect (meaning this test can be used) for the duration of the COVID-19 declaration under Section 564(b)(1) of the Act, 21 U.S.C. section 360bbb-3(b)(1), unless the authorization is terminated or revoked sooner. Performed at [facility], [address] [place], [place] [address]   Blood culture (routine x 2)     Status: None (Preliminary result)   Collection Time: 02/14/19  1:05 AM  Result Value Ref Range Status   Specimen Description BLOOD LEFT ANTECUBITAL  Final   Special Requests   Final    BOTTLES DRAWN AEROBIC AND ANAEROBIC Blood Culture adequate volume   Culture   Final    NO GROWTH < 12 HOURS Performed at [facility], [address], [place], [place] [address]    Report Status PENDING  Incomplete  Blood culture (routine x 2)     Status: None (Preliminary result)   Collection Time: 02/14/19  1:14 AM  Result Value Ref Range Status   Specimen Description BLOOD RIGHT ANTECUBITAL  Final   Special Requests   Final    BOTTLES DRAWN AEROBIC AND ANAEROBIC Blood Culture adequate volume   Culture   Final    NO GROWTH < 12 HOURS Performed at [facility], [address], [place], [place] [address]    Report Status PENDING  Incomplete  MRSA PCR Screening     Status: None   Collection Time: 02/14/19  5:06 AM  Result Value Ref Range Status   MRSA by PCR NEGATIVE NEGATIVE  Final    Comment:        The GeneXpert MRSA Assay (FDA approved for NASAL specimens only), is one component of a comprehensive MRSA colonization surveillance program. It is not intended to diagnose MRSA infection nor to guide or monitor treatment for MRSA infections. Performed at [facility], [address] [place],  [address]     IMAGING RESULTS:   May 2020 Acute bronchiolitis in the lower lobes, probably infectious or possibly related to aspiration given distribution. This pattern can be seen with  mycobacterial infection including tuberculosis which should be excluded given patient history. 2. Lesion in the periphery of the right upper lobe is present on multiple interval chest radiographs from the prior CT. 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      June 2017  Focal consolidation in peripheral right upper lobe with central air bronchograms and possible early cavitary changes. These findings are most consistent with pneumonia. Followup PA and lateral chest X-ray is recommended in 3-4 weeks following trial of antibiotic therapy to ensure resolution and exclude underlying malignancy.  I have personally reviewed the films ? Impression/Recommendation ?43 y.o. hispanic male with a history of DM, (Brittle) , DKA, gastroparesis treated TB in 2017 for 6 months  Presents with  midsternal CP and SOB of sudden onset. Pt says he was eating fish on Monday  and was eating fast and a lot and felt that it was stuck in his mid chest and he was having pain. As he was not able to get rid of it he came to the ED after 24 hrs.  ? 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 Has a persistent lesion on he Ct chest rt upper lobe- This need not be reactivation, clinically does not look like recurrent TB.   need to r/.o malignancy ( especially associated with scar tissue) pt cannot cough or bring up sputum, even the first time he had to undergo bronchoscopy to get sample- Recommend pulmonary consult to get their input on the lesion  Currently on ceftriaxone and azithromycin- doubt CAP, risk for aspiration.can stop  azithromycin and add flagyl

## 2019-02-14 NOTE — Progress Notes (Signed)
eLink Physician-Brief Progress Note Patient Name: Gregory Moody DOB: [birth year] MRN: [id number]   Date of Service  02/14/2019  HPI/Events of Note  Pt is 43 yo male admitted with epigastric discomfort in the context of a history of gastroparesis as a result of poorly controlled type 2 diabetes.  eICU Interventions  New patient evaluation completed.        Kerry Kass Ogan 02/14/2019, 5:10 AM

## 2019-02-15 LAB — BASIC METABOLIC PANEL
Anion gap: 6 (ref 5–15)
BUN: 18 mg/dL (ref 6–20)
CO2: 21 mmol/L — ABNORMAL LOW (ref 22–32)
Calcium: 8.4 mg/dL — ABNORMAL LOW (ref 8.9–10.3)
Chloride: 111 mmol/L (ref 98–111)
Creatinine, Ser: 1.25 mg/dL — ABNORMAL HIGH (ref 0.61–1.24)
GFR calc Af Amer: >60 mL/min (ref >60)
GFR calc non Af Amer: >60 mL/min (ref >60)
Glucose, Bld: 112 mg/dL — ABNORMAL HIGH (ref 70–99)
Potassium: 4.7 mmol/L (ref 3.5–5.1)
Sodium: 138 mmol/L (ref 135–145)

## 2019-02-15 LAB — GLUCOSE, CAPILLARY
Glucose-Capillary: 82 mg/dL (ref 70–99)
Glucose-Capillary: 89 mg/dL (ref 70–99)

## 2019-02-15 MED ORDER — AZITHROMYCIN 500 MG PO TABS: 500.0000 mg | ORAL_TABLET | Freq: Every day | ORAL | 0 refills | Status: AC

## 2019-02-15 NOTE — Progress Notes (Signed)
Pharmacy Electrolyte Monitoring Consult:  Pharmacy consulted to assist in monitoring and replacing electrolytes in this 43 y.o. male admitted on 02/13/2019 with CAP/Hypoglycemia. Patient is currently being ruled out for TB.    Assessment/Plan: Per conversation with MD, due to hyperkalmia and hyperglycemia, patients fluids transitioned from D10/0.45%NS with Potassium 62mEq at 125mL/hr to D5/0.45%NS at 11mL/hr.   Will obtain follow up potassium at 1800.   Per MD, pharmacy to address electrolytes and follow diabetes team recommendations.   Pharmacy will continue to monitor while patient in ICU.   Korinna Tat L 02/14/19

## 2019-02-15 NOTE — Discharge Summary (Signed)
[facility] NAME: Gregory Moody    MR#:  [id number]  DATE OF BIRTH:  May 04, 1976  DATE OF ADMISSION:  02/13/2019   ADMITTING PHYSICIAN: Harrie Foreman, MD  DATE OF DISCHARGE: 02/15/2019  PRIMARY CARE PHYSICIAN: [place]   ADMISSION DIAGNOSIS:  Shortness of breath [R06.02] Atypical chest pain [R07.89] Atypical pneumonia [J18.9] Esophagitis [K20.9] Chest pain [R07.9] Hypoglycemia due to type 2 diabetes mellitus ([employer] [employer]) [E11.649] Chronic renal failure, unspecified CKD stage [N18.9] DISCHARGE DIAGNOSIS:  Active Problems:   CAP (community acquired pneumonia)  SECONDARY DIAGNOSIS:   Past Medical History:  Diagnosis Date  . Diabetes mellitus without complication ([place])   . Gastroparesis   . Tuberculosis    HOSPITAL COURSE:   Chief Complaint: Chest pain HPI: The patient with past medical history of diabetes presented to the emergency department complaining of chest pain.  The pain does not radiate. He denies cough, fever or shortness of breath.  Chest x-ray was concerning for convex opacity at the fissure.  CT scan shows bilateral lower lobe inflammation as well.  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  He was given ceftriaxone and azithromycin in the emergency department prior to admission to hospitalist service.  Please refer to the H&P dictated for further details.  Pressures  Hospital course; 1.  Pneumonia: Presumably community-acquired, however the patient has had TB and was treated in the past but has radiographic findings concerning for current infection or reactivation of tuberculosis.    Patient was treated with IV azithromycin and Rocephin.  Was initially placed on airborne precaution.  Patient seen by infectious disease specialist Dr. Delaine Lame who also recommended pulmonary consultation.  I discussed case with the pulmonologist in the ICU Dr. 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  She however recommended having patient follow-up with them in pulmonary clinic for outpatient consultation and to arrange for outpatient PET scan for further evaluation.  Appointment made to follow-up with Dr. Patsey Berthold. Infectious disease specialist also has given clearance to discontinue TB isolation.  Gold QuantiFERON test was also sent but even if positive, will only show presence of prior exposure to TB.  Clinically patient does not have evidence of active TB infection.  Patient discharged empirically on p.o. azithromycin for 3 days for presumed community-acquired pneumonia.  2.  History of diabetes mellitus Initially presented with hypoglycemia due to decreased p.o. intake.  Resolved.  Resumed home regimen.  3.  AKI: Superimposed upon chronic kidney disease.    Resolved with IV fluids   Disposition; patient clinically and hemodynamically stable for discharge.  Follow-up with primary care physician.  Follow-up with pulmonary clinic as scheduled.  DISCHARGE CONDITIONS:  Stable CONSULTS OBTAINED:   DRUG ALLERGIES:  No Known Allergies DISCHARGE MEDICATIONS:   Allergies as of 02/15/2019   No Known Allergies     Medication List    STOP taking these medications   omeprazole 20 MG capsule Commonly known as:  PriLOSEC     TAKE these medications   azithromycin 500 MG tablet Commonly known as:  Zithromax Take 1 tablet (500 mg total) by mouth daily for 3 days. Take 1 tablet daily for 3 days.   FLUoxetine 20 MG capsule Commonly known as:  PROZAC Take 20 mg by mouth daily.   insulin NPH-regular Human (70-30) 100 UNIT/ML injection 25 units with breakfast in am and 35  units with supper   pantoprazole 40 MG tablet Commonly known as:  PROTONIX Take 40 mg by mouth daily.        DISCHARGE INSTRUCTIONS:   DIET:  Diabetic diet DISCHARGE CONDITION:  Stable ACTIVITY:  Activity as tolerated OXYGEN:   Home Oxygen: No.  Oxygen Delivery: room air DISCHARGE LOCATION:  home   If you experience worsening of your admission symptoms, develop shortness of breath, life threatening emergency, suicidal or homicidal thoughts you must seek medical attention immediately by calling 911 or calling your MD immediately  if symptoms less severe.  You Must read complete instructions/literature along with all the possible adverse reactions/side effects for all the Medicines you take and that have been prescribed to you. Take any new Medicines after you have completely understood and accpet all the possible adverse reactions/side effects.   Please note  You were cared for by a hospitalist during your hospital stay. If you have any questions about your discharge medications or the care you received while you were in the hospital after you are discharged, you can call the unit and asked to speak with the hospitalist on call if the hospitalist that took care of you is not available. Once you are discharged, your primary care physician will handle any further medical issues. Please note that NO REFILLS for any discharge medications will be authorized once you are discharged, as it is imperative that you return to your primary care physician (or establish a relationship with a primary care physician if you do not have one) for your aftercare needs so that they can reassess your need for medications and monitor your lab values.    On the day of Discharge:  VITAL SIGNS:  Blood pressure 115/79, pulse 73, temperature (!) 97.5 F (36.4 C), temperature source Oral, resp. rate 16, height 5\' 8"  (1.727 m), weight 61 kg, SpO2 100 %. PHYSICAL EXAMINATION:  GENERAL:  43 y.o.-year-old patient lying in the bed with no acute distress.  EYES: Pupils equal, round, reactive to light and accommodation. No scleral icterus. Extraocular muscles intact.  HEENT: Head atraumatic, normocephalic. Oropharynx and nasopharynx clear.  NECK:   Supple, no jugular venous distention. No thyroid enlargement, no tenderness.  LUNGS: Normal breath sounds bilaterally, no wheezing, rales,rhonchi or crepitation. No use of accessory muscles of respiration.  CARDIOVASCULAR: S1, S2 normal. No murmurs, rubs, or gallops.  ABDOMEN: Soft, non-tender, non-distended. Bowel sounds present. No organomegaly or mass.  EXTREMITIES: No pedal edema, cyanosis, or clubbing.  NEUROLOGIC: Cranial nerves II through XII are intact. Muscle strength 5/5 in all extremities. Sensation intact. Gait not checked.  PSYCHIATRIC: The patient is alert and oriented x 3.  SKIN: No obvious rash, lesion, or ulcer.  DATA REVIEW:   CBC Recent Labs  Lab 02/13/19 2213  WBC 10.9*  HGB 9.4*  HCT 28.8*  PLT 286    Chemistries  Recent Labs  Lab 02/13/19 2213  02/15/19 0535  NA 134*   < > 138  K 3.4*   < > 4.7  CL 103   < > 111  CO2 24   < > 21*  GLUCOSE 26*   < > 112*  BUN 24*   < > 18  CREATININE 1.62*   < > 1.25*  CALCIUM 8.8*   < > 8.4*  AST 13*  --   --   ALT 10  --   --   ALKPHOS 93  --   --   BILITOT 0.6  --   --    < > =  values in this interval not displayed.     Microbiology Results  Results for orders placed or performed during the hospital encounter of 02/13/19  SARS Coronavirus 2 (CEPHEID- Performed in [place] hospital lab), Hosp Order     Status: None   Collection Time: 02/13/19 11:56 PM  Result Value Ref Range Status   SARS Coronavirus 2 NEGATIVE NEGATIVE Final    Comment: (NOTE) If result is NEGATIVE SARS-CoV-2 target nucleic acids are NOT DETECTED. The SARS-CoV-2 RNA is generally detectable in upper and lower  respiratory specimens during the acute phase of infection. The lowest  concentration of SARS-CoV-2 viral copies this assay can detect is 250  copies / mL. A negative result does not preclude SARS-CoV-2 infection  and should not be used as the sole basis for treatment or other  patient management decisions.  A negative result may  occur with  improper specimen collection / handling, submission of specimen other  than nasopharyngeal swab, presence of viral mutation(s) within the  areas targeted by this assay, and inadequate number of viral copies  (<250 copies / mL). A negative result must be combined with clinical  observations, patient history, and epidemiological information. If result is POSITIVE SARS-CoV-2 target nucleic acids are DETECTED. The SARS-CoV-2 RNA is generally detectable in upper and lower  respiratory specimens dur ing the acute phase of infection.  Positive  results are indicative of active infection with SARS-CoV-2.  Clinical  correlation with patient history and other diagnostic information is  necessary to determine patient infection status.  Positive results do  not rule out bacterial infection or co-infection with other viruses. If result is PRESUMPTIVE POSTIVE SARS-CoV-2 nucleic acids MAY BE PRESENT.   A presumptive positive result was obtained on the submitted specimen  and confirmed on repeat testing.  While 2019 novel coronavirus  (SARS-CoV-2) nucleic acids may be present in the submitted sample  additional confirmatory testing may be necessary for epidemiological  and / or clinical management purposes  to differentiate between  SARS-CoV-2 and other Sarbecovirus currently known to infect humans.  If clinically indicated additional testing with an alternate test  methodology [phone]) is advised. The SARS-CoV-2 RNA is generally  detectable in upper and lower respiratory sp ecimens during the acute  phase of infection. The expected result is Negative. Fact Sheet for Patients:  StrictlyIdeas.no Fact Sheet for Healthcare Providers: BankingDealers.co.za This test is not yet approved or cleared by the [place] FDA and has been authorized for detection and/or diagnosis of SARS-CoV-2 by FDA under an Emergency Use Authorization (EUA).  This  EUA will remain in effect (meaning this test can be used) for the duration of the COVID-19 declaration under Section 564(b)(1) of the Act, 21 U.S.C. section 360bbb-3(b)(1), unless the authorization is terminated or revoked sooner. Performed at [facility], [address] [place], [place] [address]   Blood culture (routine x 2)     Status: None (Preliminary result)   Collection Time: 02/14/19  1:05 AM  Result Value Ref Range Status   Specimen Description BLOOD LEFT ANTECUBITAL  Final   Special Requests   Final    BOTTLES DRAWN AEROBIC AND ANAEROBIC Blood Culture adequate volume   Culture   Final    NO GROWTH 1 DAY Performed at [facility], [address], [place], [place] [address]    Report Status PENDING  Incomplete  Blood culture (routine x 2)     Status: None (Preliminary result)   Collection Time: 02/14/19  1:14  AM  Result Value Ref Range Status   Specimen Description BLOOD RIGHT ANTECUBITAL  Final   Special Requests   Final    BOTTLES DRAWN AEROBIC AND ANAEROBIC Blood Culture adequate volume   Culture   Final    NO GROWTH 1 DAY Performed at [facility], [address], [place], [place] [address]    Report Status PENDING  Incomplete  MRSA PCR Screening     Status: None   Collection Time: 02/14/19  5:06 AM  Result Value Ref Range Status   MRSA by PCR NEGATIVE NEGATIVE Final    Comment:        The GeneXpert MRSA Assay (FDA approved for NASAL specimens only), is one component of a comprehensive MRSA colonization surveillance program. It is not intended to diagnose MRSA infection nor to guide or monitor treatment for MRSA infections. Performed at [facility], [address] [place], [place] [address]     RADIOLOGY:  No results found.   Management plans discussed with the patient, family and they are in agreement.  CODE STATUS: Full Code   TOTAL TIME TAKING CARE OF THIS PATIENT: 39 minutes.    Ellissa Ayo M.D on  02/15/2019 at 1:48 PM  Between 7am to 6pm - Pager - [phone]  After 6pm go to [email or web address] - Technical brewer [facility]  Office  [phone]  CC: Primary care physician; [place]   Note: This dictation was prepared with Dragon dictation along with smaller Company secretary. Any transcriptional errors that result from this process are unintentional.

## 2019-02-15 NOTE — Plan of Care (Signed)
Pt discharged home self care, Per Dr Delaine Lame pt does not have TB recurrence.  IV site DCd, bleeding contorlled, pt given his f/u info,scrips, and DC instructions, understanding verbalized.

## 2019-02-16 LAB — QUANTIFERON-TB GOLD PLUS (RQFGPL)
QuantiFERON Mitogen Value: 1.21 IU/mL
QuantiFERON Nil Value: 0.02 IU/mL
QuantiFERON TB1 Ag Value: 4.63 IU/mL
QuantiFERON TB2 Ag Value: 4.12 IU/mL

## 2019-02-16 LAB — QUANTIFERON-TB GOLD PLUS: QuantiFERON-TB Gold Plus: POSITIVE — AB

## 2019-02-19 LAB — CULTURE, BLOOD (ROUTINE X 2)
Culture: NO GROWTH
Culture: NO GROWTH
Special Requests: ADEQUATE
Special Requests: ADEQUATE

## 2019-02-22 ENCOUNTER — Inpatient Hospital Stay: Payer: Self-pay | Admitting: Pulmonary Disease

## 2019-03-06 ENCOUNTER — Inpatient Hospital Stay
Admission: EM | Admit: 2019-03-06 | Discharge: 2019-03-07 | DRG: 392 | Disposition: A | Discharge status: Home / Self Care | Payer: Self-pay | Attending: Internal Medicine | Admitting: Internal Medicine

## 2019-03-06 ENCOUNTER — Other Ambulatory Visit: Payer: Self-pay

## 2019-03-06 DIAGNOSIS — E1143 Type 2 diabetes mellitus with diabetic autonomic (poly)neuropathy: Secondary | ICD-10-CM | POA: Diagnosis present

## 2019-03-06 DIAGNOSIS — E860 Dehydration: Secondary | ICD-10-CM | POA: Diagnosis present

## 2019-03-06 DIAGNOSIS — R0600 Dyspnea, unspecified: Secondary | ICD-10-CM

## 2019-03-06 DIAGNOSIS — E1122 Type 2 diabetes mellitus with diabetic chronic kidney disease: Secondary | ICD-10-CM | POA: Diagnosis present

## 2019-03-06 DIAGNOSIS — N189 Chronic kidney disease, unspecified: Secondary | ICD-10-CM | POA: Diagnosis present

## 2019-03-06 DIAGNOSIS — Z7982 Long term (current) use of aspirin: Secondary | ICD-10-CM

## 2019-03-06 DIAGNOSIS — K3184 Gastroparesis: Secondary | ICD-10-CM | POA: Diagnosis present

## 2019-03-06 DIAGNOSIS — K210 Gastro-esophageal reflux disease with esophagitis: Principal | ICD-10-CM | POA: Diagnosis present

## 2019-03-06 DIAGNOSIS — Z9114 Patient's other noncompliance with medication regimen: Secondary | ICD-10-CM

## 2019-03-06 DIAGNOSIS — D509 Iron deficiency anemia, unspecified: Secondary | ICD-10-CM | POA: Diagnosis present

## 2019-03-06 DIAGNOSIS — Z20828 Contact with and (suspected) exposure to other viral communicable diseases: Secondary | ICD-10-CM | POA: Diagnosis present

## 2019-03-06 DIAGNOSIS — Z794 Long term (current) use of insulin: Secondary | ICD-10-CM

## 2019-03-06 DIAGNOSIS — Z833 Family history of diabetes mellitus: Secondary | ICD-10-CM

## 2019-03-06 DIAGNOSIS — R1310 Dysphagia, unspecified: Secondary | ICD-10-CM

## 2019-03-06 DIAGNOSIS — Z79899 Other long term (current) drug therapy: Secondary | ICD-10-CM

## 2019-03-06 DIAGNOSIS — N179 Acute kidney failure, unspecified: Secondary | ICD-10-CM | POA: Diagnosis present

## 2019-03-06 DIAGNOSIS — R739 Hyperglycemia, unspecified: Secondary | ICD-10-CM

## 2019-03-06 DIAGNOSIS — Z8611 Personal history of tuberculosis: Secondary | ICD-10-CM

## 2019-03-06 LAB — COMPREHENSIVE METABOLIC PANEL
ALT: 22 U/L (ref 0–44)
AST: 16 U/L (ref 15–41)
Albumin: 3.8 g/dL (ref 3.5–5.0)
Alkaline Phosphatase: 133 U/L — ABNORMAL HIGH (ref 38–126)
Anion gap: 9 (ref 5–15)
BUN: 37 mg/dL — ABNORMAL HIGH (ref 6–20)
CO2: 16 mmol/L — ABNORMAL LOW (ref 22–32)
Calcium: 8.7 mg/dL — ABNORMAL LOW (ref 8.9–10.3)
Chloride: 106 mmol/L (ref 98–111)
Creatinine, Ser: 3.14 mg/dL — ABNORMAL HIGH (ref 0.61–1.24)
GFR calc Af Amer: 27 mL/min — ABNORMAL LOW (ref >60)
GFR calc non Af Amer: 23 mL/min — ABNORMAL LOW (ref >60)
Glucose, Bld: 334 mg/dL — ABNORMAL HIGH (ref 70–99)
Potassium: 4.2 mmol/L (ref 3.5–5.1)
Sodium: 131 mmol/L — ABNORMAL LOW (ref 135–145)
Total Bilirubin: 0.6 mg/dL (ref 0.3–1.2)
Total Protein: 7.7 g/dL (ref 6.5–8.1)

## 2019-03-06 LAB — CBC WITH DIFFERENTIAL/PLATELET
Abs Immature Granulocytes: 0.01 10*3/uL (ref 0.00–0.07)
Basophils Absolute: 0 10*3/uL (ref 0.0–0.1)
Basophils Relative: 0 %
Eosinophils Absolute: 0.1 10*3/uL (ref 0.0–0.5)
Eosinophils Relative: 2 %
HCT: 27.6 % — ABNORMAL LOW (ref 39.0–52.0)
Hemoglobin: 9 g/dL — ABNORMAL LOW (ref 13.0–17.0)
Immature Granulocytes: 0 %
Lymphocytes Relative: 39 %
Lymphs Abs: 1.8 10*3/uL (ref 0.7–4.0)
MCH: 30.1 pg (ref 26.0–34.0)
MCHC: 32.6 g/dL (ref 30.0–36.0)
MCV: 92.3 fL (ref 80.0–100.0)
Monocytes Absolute: 0.3 10*3/uL (ref 0.1–1.0)
Monocytes Relative: 6 %
Neutro Abs: 2.4 10*3/uL (ref 1.7–7.7)
Neutrophils Relative %: 53 %
Platelets: 383 10*3/uL (ref 150–400)
RBC: 2.99 MIL/uL — ABNORMAL LOW (ref 4.22–5.81)
RDW: 13.8 % (ref 11.5–15.5)
WBC: 4.6 10*3/uL (ref 4.0–10.5)
nRBC: 0 % (ref 0.0–0.2)

## 2019-03-06 LAB — TROPONIN I: Troponin I: <0.03 ng/mL (ref <0.03)

## 2019-03-06 LAB — LIPASE, BLOOD: Lipase: 44 U/L (ref 11–51)

## 2019-03-06 MED ORDER — ALUM & MAG HYDROXIDE-SIMETH 200-200-20 MG/5ML PO SUSP
30.0000 mL | Freq: Once | ORAL | Status: AC
  Administered 2019-03-06: 30 mL via ORAL
  Filled 2019-03-06: qty 30

## 2019-03-06 MED ORDER — ENOXAPARIN SODIUM 40 MG/0.4ML ~~LOC~~ SOLN
30.0000 mg | SUBCUTANEOUS | Status: DC
  Filled 2019-03-06: qty 0.4

## 2019-03-06 MED ORDER — SODIUM CHLORIDE 0.9 % IV SOLN
Freq: Once | INTRAVENOUS | Status: AC
  Administered 2019-03-06: 22:00:00 via INTRAVENOUS

## 2019-03-06 MED ORDER — ACETAMINOPHEN 325 MG PO TABS
650.0000 mg | ORAL_TABLET | Freq: Four times a day (QID) | ORAL | Status: DC | PRN
  Administered 2019-03-07: 650 mg via ORAL
  Filled 2019-03-06: qty 2

## 2019-03-06 MED ORDER — SODIUM CHLORIDE 0.9 % IV SOLN
INTRAVENOUS | Status: DC
  Administered 2019-03-07: 01:00:00 via INTRAVENOUS

## 2019-03-06 MED ORDER — INSULIN ASPART 100 UNIT/ML ~~LOC~~ SOLN
0.0000 [IU] | Freq: Three times a day (TID) | SUBCUTANEOUS | Status: DC
  Administered 2019-03-07 (×2): 3 [IU] via SUBCUTANEOUS
  Filled 2019-03-06 (×3): qty 1

## 2019-03-06 MED ORDER — VITAMIN B-1 100 MG PO TABS
100.0000 mg | ORAL_TABLET | Freq: Every day | ORAL | Status: DC
  Administered 2019-03-07: 100 mg via ORAL
  Filled 2019-03-06: qty 1

## 2019-03-06 MED ORDER — LIDOCAINE VISCOUS HCL 2 % MT SOLN
15.0000 mL | Freq: Once | OROMUCOSAL | Status: AC
  Administered 2019-03-06: 15 mL via ORAL
  Filled 2019-03-06: qty 15

## 2019-03-06 MED ORDER — FLUOXETINE HCL 20 MG PO CAPS
20.0000 mg | ORAL_CAPSULE | Freq: Every day | ORAL | Status: DC
  Administered 2019-03-07: 20 mg via ORAL
  Filled 2019-03-06: qty 1

## 2019-03-06 MED ORDER — POLYETHYLENE GLYCOL 3350 17 G PO PACK: 17.0000 g | PACK | Freq: Every day | ORAL | Status: DC | PRN

## 2019-03-06 MED ORDER — FOLIC ACID 1 MG PO TABS
1.0000 mg | ORAL_TABLET | Freq: Every day | ORAL | Status: DC
  Administered 2019-03-07: 1 mg via ORAL
  Filled 2019-03-06: qty 1

## 2019-03-06 MED ORDER — ACETAMINOPHEN 650 MG RE SUPP: 650.0000 mg | Freq: Four times a day (QID) | RECTAL | Status: DC | PRN

## 2019-03-06 MED ORDER — INSULIN ASPART PROT & ASPART (70-30 MIX) 100 UNIT/ML ~~LOC~~ SUSP
25.0000 [IU] | Freq: Two times a day (BID) | SUBCUTANEOUS | Status: DC
  Administered 2019-03-07: 25 [IU] via SUBCUTANEOUS
  Filled 2019-03-06: qty 10

## 2019-03-06 MED ORDER — PANTOPRAZOLE SODIUM 40 MG PO TBEC: 40.0000 mg | DELAYED_RELEASE_TABLET | Freq: Every day | ORAL | Status: DC

## 2019-03-06 MED ORDER — ONDANSETRON HCL 4 MG/2ML IJ SOLN: 4.0000 mg | Freq: Four times a day (QID) | INTRAMUSCULAR | Status: DC | PRN

## 2019-03-06 MED ORDER — METOCLOPRAMIDE HCL 5 MG/ML IJ SOLN
10.0000 mg | Freq: Once | INTRAMUSCULAR | Status: AC
  Administered 2019-03-06: 10 mg via INTRAVENOUS
  Filled 2019-03-06: qty 2

## 2019-03-06 MED ORDER — ASPIRIN EC 81 MG PO TBEC: 81.0000 mg | DELAYED_RELEASE_TABLET | Freq: Every day | ORAL | Status: DC

## 2019-03-06 MED ORDER — SODIUM CHLORIDE 0.9% FLUSH
3.0000 mL | Freq: Two times a day (BID) | INTRAVENOUS | Status: DC
  Administered 2019-03-07: 3 mL via INTRAVENOUS

## 2019-03-06 MED ORDER — ONDANSETRON HCL 4 MG PO TABS: 4.0000 mg | ORAL_TABLET | Freq: Four times a day (QID) | ORAL | Status: DC | PRN

## 2019-03-06 MED ORDER — ADULT MULTIVITAMIN W/MINERALS CH
1.0000 | ORAL_TABLET | Freq: Every day | ORAL | Status: DC
  Administered 2019-03-07: 1 via ORAL
  Filled 2019-03-06: qty 1

## 2019-03-06 MED ORDER — INSULIN ASPART 100 UNIT/ML ~~LOC~~ SOLN
0.0000 [IU] | Freq: Every day | SUBCUTANEOUS | Status: DC
  Administered 2019-03-07: 3 [IU] via SUBCUTANEOUS
  Filled 2019-03-06: qty 1

## 2019-03-06 NOTE — ED Notes (Signed)
Pt reports relief of pain

## 2019-03-06 NOTE — ED Notes (Signed)
ED TO INPATIENT HANDOFF REPORT  ED Nurse Name and Phone #: Seth Bake [phone]  S Name/Age/Gender Gregory Moody 43 y.o. male Room/Bed: ED26A/ED26A  Code Status   Code Status: Full Code  Home/SNF/Other Home Patient oriented to: self, place, time and situation Is this baseline? Yes   Triage Complete: Triage complete  Chief Complaint food stuck in throat  Triage Note Pt having difficulty swallowing X 2-3 days ago, feels that food might be stuck further down in esophagus. Pt alert and oriented X4, active, cooperative, pt in NAD. RR even and unlabored, color WNL.  Hx of similar before.denies need for interpreter at this time.    Allergies No Known Allergies  Level of Care/Admitting Diagnosis ED Disposition    ED Disposition Condition [facility]: [place] [[id number]]  Level of Care: Med-Surg [16]  Covid Evaluation: N/A  Diagnosis: Acute renal failure (ARF) [facility]) [[id number]]  Admitting Physician: Hyman Bible DODD [[id number]]  Attending Physician: Hyman Bible DODD [[id number]]  Estimated length of stay: past midnight tomorrow  Certification:: I certify this patient will need inpatient services for at least 2 midnights  PT Class (Do Not Modify): Inpatient [101]  PT Acc Code (Do Not Modify): Private [1]       B Medical/Surgery History Past Medical History:  Diagnosis Date  . Diabetes mellitus without complication ([place])   . Gastroparesis   . Tuberculosis    Past Surgical History:  Procedure Laterality Date  . ESOPHAGOGASTRODUODENOSCOPY N/A 02/03/2019   Procedure: ESOPHAGOGASTRODUODENOSCOPY (EGD);  Surgeon: Toledo, Benay Pike, MD;  Location: [facility] ENDOSCOPY;  Service: Gastroenterology;  Laterality: N/A;  . NO PAST SURGERIES       A IV Location/Drains/Wounds Patient Lines/Drains/Airways Status   Active Line/Drains/Airways    Name:   Placement date:   Placement time:   Site:   Days:   Peripheral IV 03/06/19 Right Antecubital    03/06/19    2123    Antecubital   less than 1          Intake/Output Last 24 hours No intake or output data in the 24 hours ending 03/06/19 2334  Labs/Imaging Results for orders placed or performed during the hospital encounter of 03/06/19 (from the past 48 hour(s))  CBC with Differential     Status: Abnormal   Collection Time: 03/06/19  9:22 PM  Result Value Ref Range   WBC 4.6 4.0 - 10.5 K/uL   RBC 2.99 (L) 4.22 - 5.81 MIL/uL   Hemoglobin 9.0 (L) 13.0 - 17.0 g/dL   HCT 27.6 (L) 39.0 - 52.0 %   MCV 92.3 80.0 - 100.0 fL   MCH 30.1 26.0 - 34.0 pg   MCHC 32.6 30.0 - 36.0 g/dL   RDW 13.8 11.5 - 15.5 %   Platelets 383 150 - 400 K/uL   nRBC 0.0 0.0 - 0.2 %   Neutrophils Relative % 53 %   Neutro Abs 2.4 1.7 - 7.7 K/uL   Lymphocytes Relative 39 %   Lymphs Abs 1.8 0.7 - 4.0 K/uL   Monocytes Relative 6 %   Monocytes Absolute 0.3 0.1 - 1.0 K/uL   Eosinophils Relative 2 %   Eosinophils Absolute 0.1 0.0 - 0.5 K/uL   Basophils Relative 0 %   Basophils Absolute 0.0 0.0 - 0.1 K/uL   Immature Granulocytes 0 %   Abs Immature Granulocytes 0.01 0.00 - 0.07 K/uL    Comment: Performed at [facility], [address] [place], [place] [address]  Comprehensive metabolic panel     Status: Abnormal   Collection Time: 03/06/19  9:22 PM  Result Value Ref Range   Sodium 131 (L) 135 - 145 mmol/L   Potassium 4.2 3.5 - 5.1 mmol/L   Chloride 106 98 - 111 mmol/L   CO2 16 (L) 22 - 32 mmol/L   Glucose, Bld 334 (H) 70 - 99 mg/dL   BUN 37 (H) 6 - 20 mg/dL   Creatinine, Ser 3.14 (H) 0.61 - 1.24 mg/dL   Calcium 8.7 (L) 8.9 - 10.3 mg/dL   Total Protein 7.7 6.5 - 8.1 g/dL   Albumin 3.8 3.5 - 5.0 g/dL   AST 16 15 - 41 U/L   ALT 22 0 - 44 U/L   Alkaline Phosphatase 133 (H) 38 - 126 U/L   Total Bilirubin 0.6 0.3 - 1.2 mg/dL   GFR calc non Af Amer 23 (L) >60 mL/min   GFR calc Af Amer 27 (L) >60 mL/min   Anion gap 9 5 - 15    Comment: Performed at [facility], [address]  [place], [place] [address]  Lipase, blood     Status: None   Collection Time: 03/06/19  9:22 PM  Result Value Ref Range   Lipase 44 11 - 51 U/L    Comment: Performed at [facility], [address] [place], [place] [address]  Troponin I - ONCE - STAT     Status: None   Collection Time: 03/06/19  9:22 PM  Result Value Ref Range   Troponin I <0.03 <0.03 ng/mL    Comment: Performed at [facility], [address] [place], [place] [address]   No results found.  Pending Labs Unresulted Labs (From admission, onward)    Start     Ordered   03/13/19 0500  Creatinine, serum  (enoxaparin (LOVENOX)    CrCl < 30 ml/min)  Weekly,   STAT    Comments:  while on enoxaparin therapy.    03/06/19 2256   03/07/19 9675  Basic metabolic panel  Tomorrow morning,   STAT     03/06/19 2256   03/07/19 0500  CBC  Tomorrow morning,   STAT     03/06/19 2256   03/06/19 2257  CBC  (enoxaparin (LOVENOX)    CrCl < 30 ml/min)  Once,   STAT    Comments:  Baseline for enoxaparin therapy IF NOT ALREADY DRAWN.  Notify MD if PLT < 100 K.    03/06/19 2256   03/06/19 2257  Creatinine, serum  (enoxaparin (LOVENOX)    CrCl < 30 ml/min)  Once,   STAT    Comments:  Baseline for enoxaparin therapy IF NOT ALREADY DRAWN.    03/06/19 2256   03/06/19 2257  Hemoglobin A1c  Once,   STAT     03/06/19 2256          Vitals/Pain Today's Vitals   03/06/19 2145 03/06/19 2148 03/06/19 2230 03/06/19 2330  BP: 107/67 99/63 99/68  120/84  Pulse: 66 65    Resp: 14 14 10  (!) 9  Temp:      TempSrc:      SpO2: 100% 100% 99% 99%  Weight:      Height:      PainSc:   0-No pain 0-No pain    Isolation Precautions No active isolations  Medications Medications  insulin aspart protamine- aspart (NOVOLOG MIX 70/30) injection 25 Units (has no administration in time range)  aspirin EC tablet 81  mg (has no administration in time range)  FLUoxetine (PROZAC) capsule 20 mg (has no administration in time range)   pantoprazole (PROTONIX) EC tablet 40 mg (has no administration in time range)  enoxaparin (LOVENOX) injection 30 mg (has no administration in time range)  0.9 %  sodium chloride infusion (has no administration in time range)  acetaminophen (TYLENOL) tablet 650 mg (has no administration in time range)    Or  acetaminophen (TYLENOL) suppository 650 mg (has no administration in time range)  polyethylene glycol (MIRALAX / GLYCOLAX) packet 17 g (has no administration in time range)  ondansetron (ZOFRAN) tablet 4 mg (has no administration in time range)    Or  ondansetron (ZOFRAN) injection 4 mg (has no administration in time range)  folic acid (FOLVITE) tablet 1 mg (has no administration in time range)  multivitamin with minerals tablet 1 tablet (has no administration in time range)  thiamine (VITAMIN B-1) tablet 100 mg (has no administration in time range)  insulin aspart (novoLOG) injection 0-15 Units (has no administration in time range)  insulin aspart (novoLOG) injection 0-5 Units (has no administration in time range)  metoCLOPramide (REGLAN) injection 10 mg (10 mg Intravenous Given 03/06/19 2148)  alum & mag hydroxide-simeth (MAALOX/MYLANTA) 200-200-20 MG/5ML suspension 30 mL (30 mLs Oral Given 03/06/19 2148)    And  lidocaine (XYLOCAINE) 2 % viscous mouth solution 15 mL (15 mLs Oral Given 03/06/19 2148)  0.9 %  sodium chloride infusion ( Intravenous New Bag/Given 03/06/19 2203)    Mobility walks Moderate fall risk   Focused Assessments Cardiac Assessment Handoff:  Cardiac Rhythm: Normal sinus rhythm Lab Results  Component Value Date   TROPONINI <0.03 03/06/2019   No results found for: DDIMER Does the Patient currently have chest pain? No     R Recommendations: See Admitting Provider Note  Report given to:   Additional Notes: n/a

## 2019-03-06 NOTE — ED Provider Notes (Signed)
[facility] Emergency Department Provider Note       Time seen: ----------------------------------------- 9:36 PM on 03/06/2019 -----------------------------------------   I have reviewed the triage vital signs and the nursing notes.  HISTORY   Chief Complaint Dysphagia    HPI Gregory Moody is a 43 y.o. male with a history of diabetes, gastroparesis, tuberculosis, pneumonia, acute kidney injury who presents to the ED for difficulty swallowing for the past 2 to 3 days.  Patient states it feels like food might be stuck further down in his esophagus.  Patient is not sure what his blood sugar is right now.  Past Medical History:  Diagnosis Date  . Diabetes mellitus without complication ([place])   . Gastroparesis   . Tuberculosis     Patient Active Problem List   Diagnosis Date Noted  . CAP (community acquired pneumonia) 02/14/2019  . AKI (acute kidney injury) ([place]) 02/02/2019  . ARF (acute renal failure) ([place]) 01/03/2019  . Malnutrition of moderate degree 02/24/2018  . GERD (gastroesophageal reflux disease) 02/23/2018  . Diabetic gastroparesis ([place]) 02/23/2018  . Diabetic foot ulcer ([place]) 02/23/2018  . Aspiration pneumonia ([place]) 04/21/2017  . Hypoglycemia 04/21/2017  . Diabetes mellitus with hyperglycemia ([place]) 04/21/2017  . Hip fracture, unspecified laterality, closed, initial encounter ([place]) 04/17/2017  . Hyperglycemia 04/17/2017  . Protein-calorie malnutrition, severe 04/09/2016  . Cavitary lesion of lung 04/06/2016    Past Surgical History:  Procedure Laterality Date  . ESOPHAGOGASTRODUODENOSCOPY N/A 02/03/2019   Procedure: ESOPHAGOGASTRODUODENOSCOPY (EGD);  Surgeon: Toledo, Benay Pike, MD;  Location: [facility] ENDOSCOPY;  Service: Gastroenterology;  Laterality: N/A;  . NO PAST SURGERIES      Allergies Patient has no known allergies.  Social History Social History   Tobacco Use  . Smoking status: Never Smoker  . Smokeless tobacco:  Never Used  Substance Use Topics  . Alcohol use: No  . Drug use: No    Review of Systems Constitutional: Negative for fever. Cardiovascular: Negative for chest pain. Respiratory: Negative for shortness of breath. Gastrointestinal: Negative for abdominal pain, vomiting and diarrhea.  Positive for dysphagia Musculoskeletal: Negative for back pain. Skin: Negative for rash. Neurological: Negative for headaches, focal weakness or numbness.  All systems negative/normal/unremarkable except as stated in the HPI  ____________________________________________   PHYSICAL EXAM:  VITAL SIGNS: ED Triage Vitals [03/06/19 2118]  Enc Vitals Group     BP (!) 70/50     Pulse Rate 79     Resp 18     Temp 97.8 F (36.6 C)     Temp Source Oral     SpO2 100 %     Weight 134 lb 7.7 oz (61 kg)     Height 5\' 8"  (1.727 m)     Head Circumference      Peak Flow      Pain Score 8     Pain Loc      Pain Edu?      Excl. in [place]?     Constitutional: Alert and oriented.  Chronically ill-appearing, no distress Eyes: Conjunctivae are normal. Normal extraocular movements. ENT      Head: Normocephalic and atraumatic.      Nose: No congestion/rhinnorhea.      Mouth/Throat: Mucous membranes are moist.      Neck: No stridor. Cardiovascular: Normal rate, regular rhythm. No murmurs, rubs, or gallops. Respiratory: Normal respiratory effort without tachypnea nor retractions. Breath sounds are clear and equal bilaterally. No wheezes/rales/rhonchi. Gastrointestinal: Soft and nontender. Normal bowel sounds Musculoskeletal: Nontender  with normal range of motion in extremities. No lower extremity tenderness nor edema. Neurologic:  Normal speech and language. No gross focal neurologic deficits are appreciated.  Skin:  Skin is warm, dry and intact. No rash noted. Psychiatric: Mood and affect are normal. Speech and behavior are normal.  ____________________________________________  EKG: Interpreted by me.  Sinus  rhythm the rate of 67 bpm, normal PR interval, nonspecific ST segment changes, normal QT  ____________________________________________  ED COURSE:  As part of my medical decision making, I reviewed the following data within the [place] History obtained from family if available, nursing notes, old chart and ekg, as well as notes from prior ED visits. Patient presented for difficulty swallowing, we will assess with labs and imaging as indicated at this time.   Procedures  Gregory Moody was evaluated in Emergency Department on 03/06/2019 for the symptoms described in the history of present illness. He was evaluated in the context of the global COVID-19 pandemic, which necessitated consideration that the patient might be at risk for infection with the SARS-CoV-2 virus that causes COVID-19. Institutional protocols and algorithms that pertain to the evaluation of patients at risk for COVID-19 are in a state of rapid change based on information released by regulatory bodies including the CDC and federal and state organizations. These policies and algorithms were followed during the patient's care in the ED.  ____________________________________________   LABS (pertinent positives/negatives)  Labs Reviewed  CBC WITH DIFFERENTIAL/PLATELET - Abnormal; Notable for the following components:      Result Value   RBC 2.99 (*)    Hemoglobin 9.0 (*)    HCT 27.6 (*)    All other components within normal limits  COMPREHENSIVE METABOLIC PANEL - Abnormal; Notable for the following components:   Sodium 131 (*)    CO2 16 (*)    Glucose, Bld 334 (*)    BUN 37 (*)    Creatinine, Ser 3.14 (*)    Calcium 8.7 (*)    Alkaline Phosphatase 133 (*)    GFR calc non Af Amer 23 (*)    GFR calc Af Amer 27 (*)    All other components within normal limits  LIPASE, BLOOD  TROPONIN I  ____________________________________________   DIFFERENTIAL DIAGNOSIS   Dysphagia, odynophagia,  gastroparesis, hyperglycemia, dehydration, electrolyte abnormality  FINAL ASSESSMENT AND PLAN  Dysphagia, acute kidney injury, hyperglycemia   Plan: The patient had presented for difficulty swallowing. Patient's labs did indicate significant worsening in his creatinine compared to prior.  This is likely secondary to poorly controlled diabetes.  He was given IV fluids as well as IV Reglan and a GI cocktail.  I will discuss with the hospitalist for admission and GI consultation.   Laurence Aly, MD    Note: This note was generated in part or whole with voice recognition software. Voice recognition is usually quite accurate but there are transcription errors that can and very often do occur. I apologize for any typographical errors that were not detected and corrected.     Earleen Newport, MD 03/06/19 2233

## 2019-03-06 NOTE — ED Triage Notes (Addendum)
Pt having difficulty swallowing X 2-3 days ago, feels that food might be stuck further down in esophagus. Pt alert and oriented X4, active, cooperative, pt in NAD. RR even and unlabored, color WNL.  Hx of similar before.denies need for interpreter at this time.

## 2019-03-06 NOTE — H&P (Signed)
[place] [facility] [place] NAME: Gregory Moody    MR#:  [id number]  DATE OF BIRTH:  07-22-1976  DATE OF ADMISSION:  03/06/2019  PRIMARY CARE PHYSICIAN: [place]   REQUESTING/REFERRING PHYSICIAN: Lenise Arena, MD  CHIEF COMPLAINT:   Chief Complaint  Patient presents with  . Dysphagia    HISTORY OF PRESENT ILLNESS:  Gregory Moody  is a 43 y.o. male with a known history of diabetes mellitus type 2, gastroparesis, tuberculosis, and reflux esophagitis diagnosed with recent EGD on 02/03/2019.  He presented to the emergency room complaining of a 3-day history of increased dysphagia with a sensation that food is getting stuck in his esophagus.  This sensation occurred earlier in the evening when he was eating a biscuit and noodles.  However patient reports he did not have nausea and vomiting.  He denies abdominal or epigastric pain.  He denies chest pain or shortness of breath.  He denies fevers or chills.  He denies hematemesis, medic easier, or melena.  Reflux esophagitis is treated with Protonix which patient reports compliance with the medication.  Diabetes is treated with 70/30 insulin which patient reports compliance with as well.  However his glucose is 334 on arrival.  He tells me glucose usually ranges 200-300.  He has a history of chronic renal failure.  BUN is 37 with creatinine 3.14 on his arrival.   He had a recent QuantiFERON gold test which was positive and evaluated by infectious disease felt to be related to past exposure and given that patient is not experiencing any active tuberculosis symptoms, patient was felt to be not experiencing active tuberculosis disease.  We have admitted him to the hospitalist service for further management.  PAST MEDICAL HISTORY:   Past Medical History:  Diagnosis Date  . Diabetes mellitus without complication ([place])   . Gastroparesis   . Tuberculosis     PAST  SURGICAL HISTORY:   Past Surgical History:  Procedure Laterality Date  . ESOPHAGOGASTRODUODENOSCOPY N/A 02/03/2019   Procedure: ESOPHAGOGASTRODUODENOSCOPY (EGD);  Surgeon: Toledo, Benay Pike, MD;  Location: [facility] ENDOSCOPY;  Service: Gastroenterology;  Laterality: N/A;  . NO PAST SURGERIES      SOCIAL HISTORY:   Social History   Tobacco Use  . Smoking status: Never Smoker  . Smokeless tobacco: Never Used  Substance Use Topics  . Alcohol use: No    FAMILY HISTORY:   Family History  Problem Relation Age of Onset  . Diabetes Mother   . Diabetes Father     DRUG ALLERGIES:  No Known Allergies  REVIEW OF SYSTEMS:   Review of Systems  Constitutional: Negative for chills, fever and malaise/fatigue.  HENT: Negative for congestion, ear pain, sinus pain, sore throat and tinnitus.   Eyes: Negative for blurred vision, double vision and pain.  Respiratory: Negative for cough, hemoptysis, sputum production, shortness of breath and wheezing.   Cardiovascular: Negative for chest pain, palpitations and leg swelling.  Gastrointestinal: Negative for abdominal pain, blood in stool, constipation, diarrhea, heartburn, melena, nausea and vomiting.       Dysphagia  Genitourinary: Negative for dysuria, flank pain, frequency and hematuria.  Musculoskeletal: Negative for falls and myalgias.  Skin: Negative for rash.  Neurological: Negative for dizziness, focal weakness and loss of consciousness.  Psychiatric/Behavioral: Negative.       MEDICATIONS AT HOME:   Prior to Admission medications   Medication Sig Start Date End Date Taking? Authorizing Provider  aspirin EC 81  MG tablet Take 81 mg by mouth daily.   Yes [provider]  insulin NPH-regular Human (70-30) 100 UNIT/ML injection 25 units with breakfast in am and 35 units with supper Patient taking differently: Inject 30 Units into the skin 2 (two) times daily with a meal. 25 units with breakfast in am and 35 units with supper  01/04/19  Yes Demetrios Loll, MD  pantoprazole (PROTONIX) 40 MG tablet Take 40 mg by mouth daily.   Yes [provider]  FLUoxetine (PROZAC) 20 MG capsule Take 20 mg by mouth daily.    [provider]      VITAL SIGNS:  Blood pressure 99/68, pulse 65, temperature 97.8 F (36.6 C), temperature source Oral, resp. rate 10, height 5\' 8"  (1.727 m), weight 61 kg, SpO2 99 %.  PHYSICAL EXAMINATION:  Physical Exam Vitals signs and nursing note reviewed.  Constitutional:      General: He is not in acute distress.    Appearance: Normal appearance. He is not ill-appearing.  HENT:     Head: Normocephalic and atraumatic.     Right Ear: External ear normal.     Left Ear: External ear normal.     Nose: Nose normal. No congestion.     Mouth/Throat:     Mouth: Mucous membranes are moist.     Pharynx: Oropharynx is clear.  Eyes:     General: No scleral icterus.    Extraocular Movements: Extraocular movements intact.     Conjunctiva/sclera: Conjunctivae normal.     Pupils: Pupils are equal, round, and reactive to light.  Neck:     Musculoskeletal: Normal range of motion and neck supple. No muscular tenderness.  Cardiovascular:     Rate and Rhythm: Normal rate and regular rhythm.     Pulses: Normal pulses.     Heart sounds: Normal heart sounds.  Pulmonary:     Effort: Pulmonary effort is normal.     Breath sounds: Normal breath sounds.  Abdominal:     General: Abdomen is flat. Bowel sounds are normal. There is no distension.     Palpations: Abdomen is soft. There is no mass.     Tenderness: There is no abdominal tenderness.     Hernia: No hernia is present.  Musculoskeletal: Normal range of motion.        General: No swelling or tenderness.  Skin:    General: Skin is warm and dry.     Findings: No rash.  Neurological:     General: No focal deficit present.     Mental Status: He is alert and oriented to person, place, and time.     Motor: No weakness.  Psychiatric:         Mood and Affect: Mood normal.        Behavior: Behavior normal.       LABORATORY PANEL:   CBC Recent Labs  Lab 03/06/19 2122  WBC 4.6  HGB 9.0*  HCT 27.6*  PLT 383   ------------------------------------------------------------------------------------------------------------------  Chemistries  Recent Labs  Lab 03/06/19 2122  NA 131*  K 4.2  CL 106  CO2 16*  GLUCOSE 334*  BUN 37*  CREATININE 3.14*  CALCIUM 8.7*  AST 16  ALT 22  ALKPHOS 133*  BILITOT 0.6   ------------------------------------------------------------------------------------------------------------------  Cardiac Enzymes Recent Labs  Lab 03/06/19 2122  TROPONINI <0.03   ------------------------------------------------------------------------------------------------------------------  RADIOLOGY:  No results found.    IMPRESSION AND PLAN:   1.  Dysphagia - Likely reflux esophagitis as previously  diagnosed with EGD on 02/03/2019.  Protonix has been restarted. - We will consult speech therapy for evaluation of swallow and recommendations - We will consult dietitian for education regarding food choices and decreased reflux and dysphagia.  2.  Diabetes mellitus type 2 - 70/30 insulin continued - Moderate sliding scale insulin - Globin A1c pending  3.  Acute renal failure - BUN 37 and creatinine 3.149 --Possibly secondary to mild dehydration - Patient received 2 L normal saline in the emergency room.  We will continue normal saline at 100 cc/h to peripheral IV.  4. history of tuberculosis - Repeat chest x-ray is pending - After recent QuantiFERON gold and infectious disease consultation, patient felt not to be contagious with positive result likely related to history of previous tuberculosis/exposure.  DVT and PPI prophylaxis have been initiated   All the records are reviewed and case discussed with ED provider. The plan of care was discussed in details with the patient (and family).  I answered all questions. The patient agreed to proceed with the above mentioned plan. Further management will depend upon hospital course.   CODE STATUS: Full code  TOTAL TIME TAKING CARE OF THIS PATIENT:45 minutes.    [place] on 03/06/2019 at 11:17 PM  Pager - [phone]  After 6pm go to [email or web address] - [employer] [facility]  Office  [phone]  CC: Primary care physician; [place]   Note: This dictation was prepared with Dragon dictation along with smaller Company secretary. Any transcriptional errors that result from this process are unintentional.

## 2019-03-06 NOTE — ED Notes (Addendum)
Pt uprite on stretcher in exam room with no distress noted; placed on card monitor; pt reports painful swallowing several days; st able to drink fluids but not food; st hx of same and had a procedure performed here for such approx month ago; resp even/unlab, lungs clear, apical audible & regular, +BS, abd soft/nondist, strong & = periph pulses

## 2019-03-07 ENCOUNTER — Inpatient Hospital Stay: Payer: Self-pay

## 2019-03-07 LAB — IRON AND TIBC
Iron: 37 ug/dL — ABNORMAL LOW (ref 45–182)
Saturation Ratios: 12 % — ABNORMAL LOW (ref 17.9–39.5)
TIBC: 313 ug/dL (ref 250–450)
UIBC: 276 ug/dL

## 2019-03-07 LAB — BASIC METABOLIC PANEL
Anion gap: 7 (ref 5–15)
Anion gap: 7 (ref 5–15)
BUN: 38 mg/dL — ABNORMAL HIGH (ref 6–20)
BUN: 33 mg/dL — ABNORMAL HIGH (ref 6–20)
CO2: 18 mmol/L — ABNORMAL LOW (ref 22–32)
CO2: 15 mmol/L — ABNORMAL LOW (ref 22–32)
Calcium: 8.6 mg/dL — ABNORMAL LOW (ref 8.9–10.3)
Calcium: 8.2 mg/dL — ABNORMAL LOW (ref 8.9–10.3)
Chloride: 110 mmol/L (ref 98–111)
Chloride: 112 mmol/L — ABNORMAL HIGH (ref 98–111)
Creatinine, Ser: 2.9 mg/dL — ABNORMAL HIGH (ref 0.61–1.24)
Creatinine, Ser: 2.52 mg/dL — ABNORMAL HIGH (ref 0.61–1.24)
GFR calc Af Amer: 29 mL/min — ABNORMAL LOW (ref >60)
GFR calc Af Amer: 35 mL/min — ABNORMAL LOW (ref >60)
GFR calc non Af Amer: 25 mL/min — ABNORMAL LOW (ref >60)
GFR calc non Af Amer: 30 mL/min — ABNORMAL LOW (ref >60)
Glucose, Bld: 220 mg/dL — ABNORMAL HIGH (ref 70–99)
Glucose, Bld: 152 mg/dL — ABNORMAL HIGH (ref 70–99)
Potassium: 3.6 mmol/L (ref 3.5–5.1)
Potassium: 3.7 mmol/L (ref 3.5–5.1)
Sodium: 135 mmol/L (ref 135–145)
Sodium: 134 mmol/L — ABNORMAL LOW (ref 135–145)

## 2019-03-07 LAB — CBC
HCT: 23 % — ABNORMAL LOW (ref 39.0–52.0)
HCT: 25.7 % — ABNORMAL LOW (ref 39.0–52.0)
Hemoglobin: 7.6 g/dL — ABNORMAL LOW (ref 13.0–17.0)
Hemoglobin: 8.5 g/dL — ABNORMAL LOW (ref 13.0–17.0)
MCH: 30.3 pg (ref 26.0–34.0)
MCH: 30.4 pg (ref 26.0–34.0)
MCHC: 33 g/dL (ref 30.0–36.0)
MCHC: 33.1 g/dL (ref 30.0–36.0)
MCV: 91.6 fL (ref 80.0–100.0)
MCV: 91.8 fL (ref 80.0–100.0)
Platelets: 333 10*3/uL (ref 150–400)
Platelets: 351 10*3/uL (ref 150–400)
RBC: 2.51 MIL/uL — ABNORMAL LOW (ref 4.22–5.81)
RBC: 2.8 MIL/uL — ABNORMAL LOW (ref 4.22–5.81)
RDW: 13.7 % (ref 11.5–15.5)
RDW: 13.7 % (ref 11.5–15.5)
WBC: 4.4 10*3/uL (ref 4.0–10.5)
WBC: 4.7 10*3/uL (ref 4.0–10.5)
nRBC: 0 % (ref 0.0–0.2)
nRBC: 0 % (ref 0.0–0.2)

## 2019-03-07 LAB — GLUCOSE, CAPILLARY
Glucose-Capillary: 297 mg/dL — ABNORMAL HIGH (ref 70–99)
Glucose-Capillary: 173 mg/dL — ABNORMAL HIGH (ref 70–99)
Glucose-Capillary: 152 mg/dL — ABNORMAL HIGH (ref 70–99)

## 2019-03-07 LAB — CREATININE, SERUM
Creatinine, Ser: 2.91 mg/dL — ABNORMAL HIGH (ref 0.61–1.24)
GFR calc Af Amer: 29 mL/min — ABNORMAL LOW (ref >60)
GFR calc non Af Amer: 25 mL/min — ABNORMAL LOW (ref >60)

## 2019-03-07 LAB — SARS CORONAVIRUS 2 BY RT PCR (HOSPITAL ORDER, PERFORMED IN ~~LOC~~ HOSPITAL LAB): SARS Coronavirus 2: NEGATIVE

## 2019-03-07 LAB — FERRITIN: Ferritin: 93 ng/mL (ref 24–336)

## 2019-03-07 LAB — ABO/RH: ABO/RH(D): O POS

## 2019-03-07 LAB — FOLATE: Folate: 21.1 ng/mL (ref >5.9)

## 2019-03-07 LAB — HEMOGLOBIN A1C
Hgb A1c MFr Bld: 8.6 % — ABNORMAL HIGH (ref 4.8–5.6)
Mean Plasma Glucose: 200.12 mg/dL

## 2019-03-07 LAB — VITAMIN B12: Vitamin B-12: 438 pg/mL (ref 180–914)

## 2019-03-07 IMAGING — DX PORTABLE CHEST - 1 VIEW
1 series · 1 of 1 positions shown · non-contrast
Comparison: [DATE]

CLINICAL DATA: Occult swallowing

EXAM:
PORTABLE CHEST 1 VIEW

[chest ap]
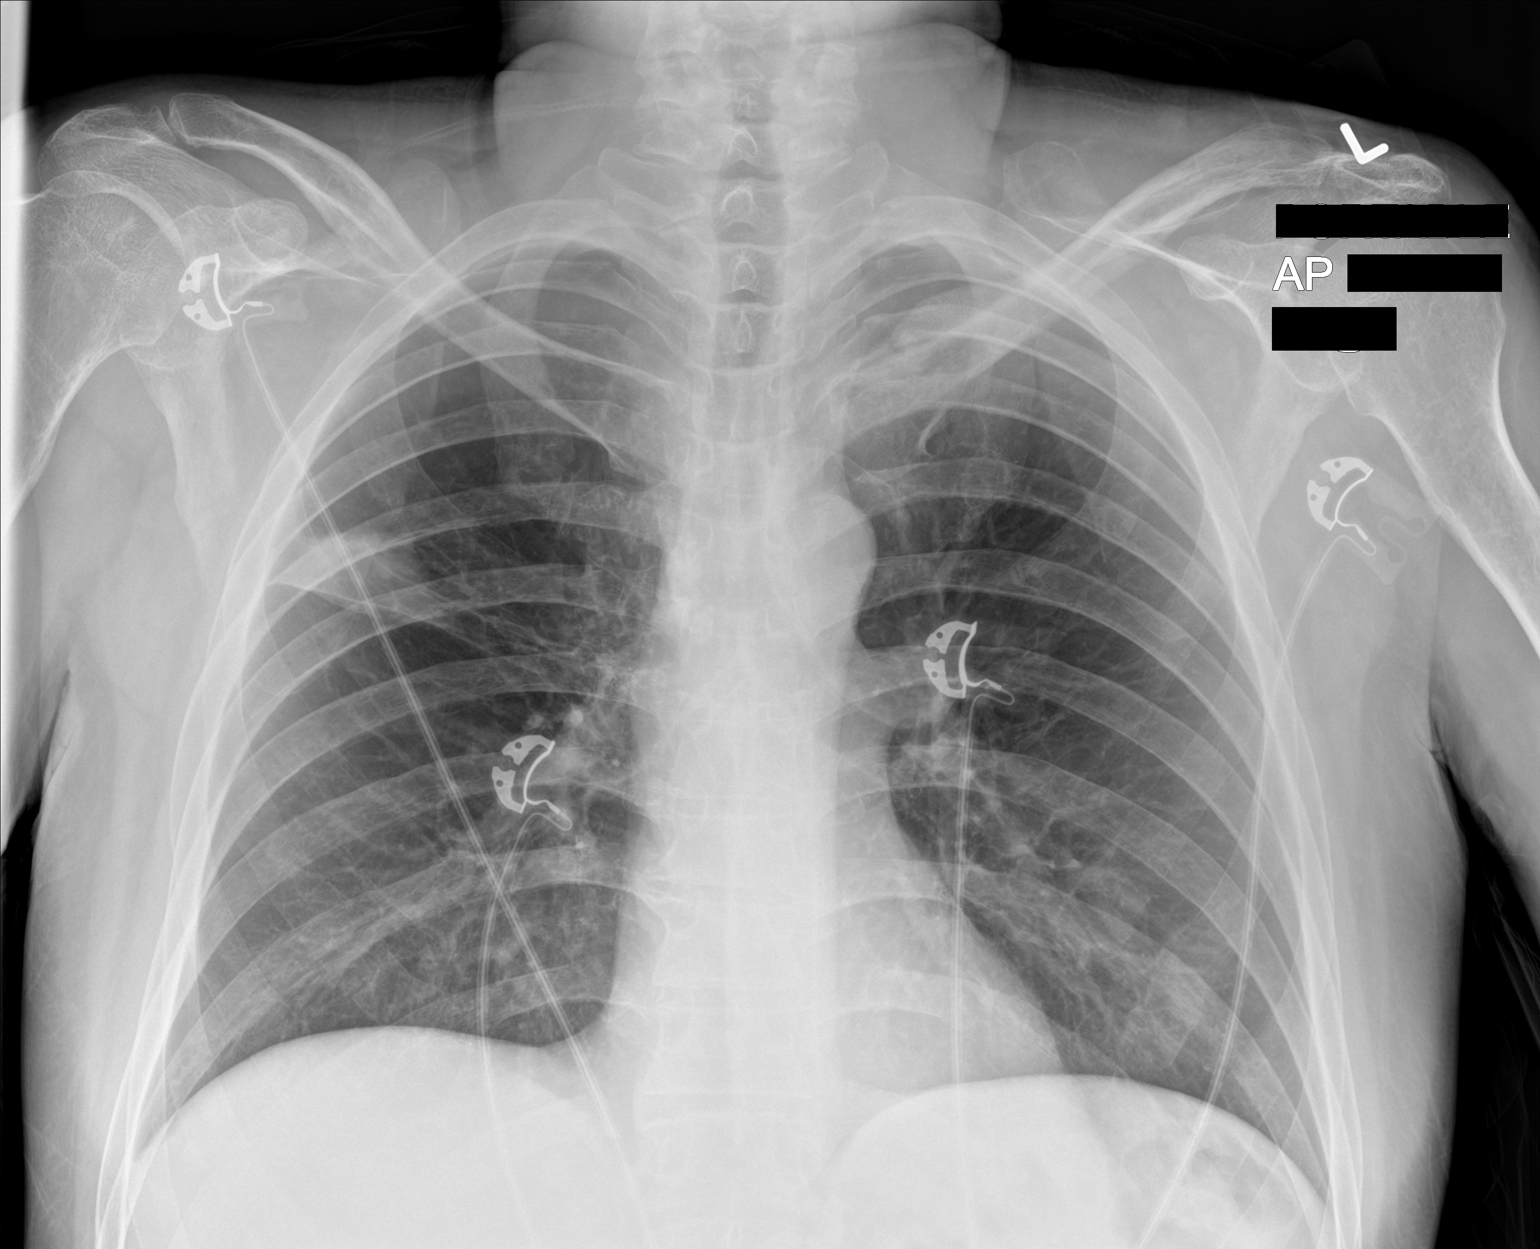

[1 of 1 positions shown; findings below may reference images not displayed]

FINDINGS: Cardiac shadows within normal limits. The lungs are well aerated
bilaterally. Ovoid density is noted in the right upper lobe just
along the minor fissure similar to that seen on prior CT
examination. No new focal infiltrate is seen. No bony abnormality is
noted.
IMPRESSION: Stable appearance when compare with the prior CT. This again likely
represents a tuberculoma.

No acute abnormality noted.

## 2019-03-07 MED ORDER — SODIUM CHLORIDE 0.9 % IV SOLN
510.0000 mg | Freq: Once | INTRAVENOUS | Status: AC
  Administered 2019-03-07: 510 mg via INTRAVENOUS
  Filled 2019-03-07: qty 17

## 2019-03-07 MED ORDER — INSULIN NPH ISOPHANE & REGULAR (70-30) 100 UNIT/ML ~~LOC~~ SUSP: 30.0000 [IU] | Freq: Two times a day (BID) | SUBCUTANEOUS | Status: DC

## 2019-03-07 MED ORDER — PANTOPRAZOLE SODIUM 40 MG PO TBEC: 40.0000 mg | DELAYED_RELEASE_TABLET | Freq: Every day | ORAL | 2 refills | Status: DC

## 2019-03-07 MED ORDER — SODIUM CHLORIDE 0.9 % IV BOLUS
1000.0000 mL | Freq: Once | INTRAVENOUS | Status: AC
  Administered 2019-03-07: 1000 mL via INTRAVENOUS

## 2019-03-07 MED ORDER — PANTOPRAZOLE SODIUM 40 MG PO TBEC
40.0000 mg | DELAYED_RELEASE_TABLET | Freq: Two times a day (BID) | ORAL | Status: DC
  Administered 2019-03-07: 40 mg via ORAL
  Filled 2019-03-07: qty 1

## 2019-03-07 MED ORDER — ENOXAPARIN SODIUM 40 MG/0.4ML ~~LOC~~ SOLN: 40.0000 mg | SUBCUTANEOUS | Status: DC

## 2019-03-07 MED ORDER — ENOXAPARIN SODIUM 30 MG/0.3ML ~~LOC~~ SOLN
30.0000 mg | SUBCUTANEOUS | Status: DC
  Administered 2019-03-07: 30 mg via SUBCUTANEOUS
  Filled 2019-03-07: qty 0.3

## 2019-03-07 NOTE — Progress Notes (Signed)
Pt BP is 64/44 manually at 1350. MD made aware. New order for 1L bolus ordered. Discharge postponed. Will continue to monitor.

## 2019-03-07 NOTE — Progress Notes (Signed)
SLP Cancellation Note  Patient Details Name: Gregory Moody MRN: [id number] DOB: 1976/05/24   Cancelled treatment:       Reason Eval/Treat Not Completed: SLP screened, no needs identified, will sign off  The patient is not demonstrating symptoms of oropharyngeal dysphagia- he is able to rapidly drink thin liquid without stopping or choking.  The patient is reporting esophageal difficulties (certain foods sticking in his esophagus) and had a recent endoscopy (02/03/2019) showing esophagitis and hiatal hernia.  The patient is able to report that he is following appropriate reflux precautions- avoid problematic foods, smaller/more frequent meals, softer/moister foods, elevate head of the bed, and not lying down after eating.  He stated that he did not follow up with GI after the endoscopy.  [name], MS/CCC- SLP  Lou Miner 03/07/2019, 10:17 AM

## 2019-03-07 NOTE — Progress Notes (Signed)
BP 118/82. MD made aware. Per MD pt is okay to discharge. RN removed IV and reviewed discharge instructions with pt. Pt will call out when his ride arrives.

## 2019-03-07 NOTE — Progress Notes (Addendum)
Rn explained discharge instructions to pt and pt verbalized understanding. AVS provided in [place] and [place]. Pt states his belly hurts "from eating too much", RN advised pt to rest. RN called pts sister who says that her or her sister cannot pick up the pt until after 5pm. Pt in no acute distress and VSS. Will have pt dressed and ready for pick up. RN printed good RX coupon for pt protonix order.

## 2019-03-07 NOTE — ED Notes (Signed)
Spoke to hospitalist about current CXR, and stated no changes from previous xray and pt did not need to be put on isolation precautions.

## 2019-03-07 NOTE — TOC Transition Note (Signed)
Transition of Care [facility]) - CM/SW Discharge Note   Patient Details  Name: Gregory Moody MRN: [id number] Date of Birth: 1976/06/08  Transition of Care [facility]) CM/SW Contact:  Beverly Sessions, RN Phone Number: 03/07/2019, 3:59 PM   Clinical Narrative:     Patient admitted with dysphasia.  Patient lives at home with daughter.  PCP is [facility].  Per patient he is currently with his PCP.  Patient states he obtains his insulin from the [facility] and each vial is $10.  Patient denies any issues obtaining his medication.  Previous admission in March RNCM provided glucometer testing kit.  Patient confirms he still has the glucometer and adequate testing supplies.    Patient to discharge with rx for Protonix.  Patient states that he wishes to take the rx to [facility] to have it filled.    Per MD patient requested for Prozac to be discontinued.   UPDATE:  Will RNCM was not on the unit bedside RN notified me that patient now request a goodrx coupon for protonix.  She printed the coupon and provided it to the patient.       Final next level of care: Home/Self Care Barriers to Discharge: Barriers Resolved   Patient Goals and CMS Choice        Discharge Placement                       Discharge Plan and Services   Discharge Planning Services: CM Consult, Medication Assistance                                 Social Determinants of Health (SDOH) Interventions     Readmission Risk Interventions Readmission Risk Prevention Plan 03/07/2019  Transportation Screening Complete  PCP or Specialist Appt within 3-5 Days Complete  Social Work Consult for [place] Planning/Counseling (No Data)  Palliative Care Screening Not Applicable  Medication Review Press [employer]) Complete  Some recent data might be hidden

## 2019-03-07 NOTE — Discharge Summary (Addendum)
[facility] [place] NAME: Gregory Moody    MR#:  [id number]  DATE OF BIRTH:  May 14, 1976  DATE OF ADMISSION:  03/06/2019 ADMITTING PHYSICIAN: Sela Hua, MD  DATE OF DISCHARGE: 03/07/2019  PRIMARY CARE PHYSICIAN: Coward    ADMISSION DIAGNOSIS:  Dyspnea [R06.00] Hyperglycemia [R73.9] Dysphagia [R13.10] Acute renal failure, unspecified acute renal failure type ([place]) [N17.9] Dysphagia, unspecified type [R13.10]  DISCHARGE DIAGNOSIS:  Active Problems:   Acute renal failure (ARF) ([place])   SECONDARY DIAGNOSIS:   Past Medical History:  Diagnosis Date  . Diabetes mellitus without complication ([place])   . Gastroparesis   . Tuberculosis     HOSPITAL COURSE:   43 year old male with history of diabetes and gastroparesis who presented to the emergency room due to difficulty swallowing.  1.  Dysphasia: Patient underwent EGD and late April that showed esophagitis with reflux.  He has not been taking his Protonix.  This is the etiology of his issue.  He is tolerating his regular diet quite well and without any difficulties at the time of discharge. I prescribed him Protonix as was recommended by previous hospitalization in April.   2.  Diabetes: Patient will continue with outpatient regimen.  3.  Acute kidney injury in the setting of dehydration poor p.o. intake which has improved with IV fluids.  He will need repeat BMP by his primary care physician in 2 days.  4.  History of TB: Recent QuantiFERON gold and an ID consult was obtained and patient was not felt to be contagious with positive result related to history of previous exposure/tuberculosis.  5.  Iron deficiency anemia: Anemia panel was consistent with iron deficiency.  Patient received IV iron prior to discharge.  Outpatient hematology follow-up will be scheduled prior to discharge. DISCHARGE CONDITIONS AND DIET:   Stable for discharge diabetic  diet  CONSULTS OBTAINED:    DRUG ALLERGIES:  No Known Allergies  DISCHARGE MEDICATIONS:   Allergies as of 03/07/2019   No Known Allergies     Medication List    STOP taking these medications   aspirin EC 81 MG tablet     TAKE these medications   FLUoxetine 20 MG capsule Commonly known as:  PROZAC Take 20 mg by mouth daily.   insulin NPH-regular Human (70-30) 100 UNIT/ML injection Inject 30 Units into the skin 2 (two) times daily with a meal. 25 units with breakfast in am and 35 units with supper   pantoprazole 40 MG tablet Commonly known as:  PROTONIX Take 1 tablet (40 mg total) by mouth daily.         Today   CHIEF COMPLAINT:   Patient is eating his food without any issues.  He is doing well and would like to go home later today.   VITAL SIGNS:  Blood pressure 95/60, pulse (!) 59, temperature 97.6 F (36.4 C), temperature source Oral, resp. rate 14, height 5\' 8"  (1.727 m), weight 61 kg, SpO2 100 %.   REVIEW OF SYSTEMS:  Review of Systems  Constitutional: Negative.  Negative for chills, fever and malaise/fatigue.  HENT: Negative.  Negative for ear discharge, ear pain, hearing loss, nosebleeds and sore throat.   Eyes: Negative.  Negative for blurred vision and pain.  Respiratory: Negative.  Negative for cough, hemoptysis, shortness of breath and wheezing.   Cardiovascular: Negative.  Negative for chest pain, palpitations and leg swelling.  Gastrointestinal: Negative.  Negative for abdominal pain, blood in stool, diarrhea,  nausea and vomiting.  Genitourinary: Negative.  Negative for dysuria.  Musculoskeletal: Negative.  Negative for back pain.  Skin: Negative.   Neurological: Negative for dizziness, tremors, speech change, focal weakness, seizures and headaches.  Endo/Heme/Allergies: Negative.  Does not bruise/bleed easily.  Psychiatric/Behavioral: Negative.  Negative for depression, hallucinations and suicidal ideas.     PHYSICAL EXAMINATION:   GENERAL:  43 y.o.-year-old patient lying in the bed with no acute distress.  NECK:  Supple, no jugular venous distention. No thyroid enlargement, no tenderness.  LUNGS: Normal breath sounds bilaterally, no wheezing, rales,rhonchi  No use of accessory muscles of respiration.  CARDIOVASCULAR: S1, S2 normal. No murmurs, rubs, or gallops.  ABDOMEN: Soft, non-tender, non-distended. Bowel sounds present. No organomegaly or mass.  EXTREMITIES: No pedal edema, cyanosis, or clubbing.  PSYCHIATRIC: The patient is alert and oriented x 3.  SKIN: No obvious rash, lesion, or ulcer.   DATA REVIEW:   CBC Recent Labs  Lab 03/07/19 0457  WBC 4.4  HGB 7.6*  HCT 23.0*  PLT 351    Chemistries  Recent Labs  Lab 03/06/19 2122  03/07/19 0457  NA 131*  --  135  K 4.2  --  3.6  CL 106  --  110  CO2 16*  --  18*  GLUCOSE 334*  --  220*  BUN 37*  --  38*  CREATININE 3.14*   < > 2.90*  CALCIUM 8.7*  --  8.6*  AST 16  --   --   ALT 22  --   --   ALKPHOS 133*  --   --   BILITOT 0.6  --   --    < > = values in this interval not displayed.    Cardiac Enzymes Recent Labs  Lab 03/06/19 2122  TROPONINI <0.03    Microbiology Results  @[email or web address] @  RADIOLOGY:  Dg Chest Port 1 View  Result Date: 03/07/2019 CLINICAL DATA:  Occult swallowing EXAM: PORTABLE CHEST 1 VIEW COMPARISON:  02/13/2019 FINDINGS: Cardiac shadows within normal limits. The lungs are well aerated bilaterally. Ovoid density is noted in the right upper lobe just along the minor fissure similar to that seen on prior CT examination. No new focal infiltrate is seen. No bony abnormality is noted. IMPRESSION: Stable appearance when compare with the prior CT. 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 No acute abnormality noted. Electronically Signed   By: Inez Catalina M.D.   On: 03/07/2019 00:25      Allergies as of 03/07/2019   No Known Allergies     Medication List    STOP taking these medications   aspirin EC 81 MG  tablet     TAKE these medications   FLUoxetine 20 MG capsule Commonly known as:  PROZAC Take 20 mg by mouth daily.   insulin NPH-regular Human (70-30) 100 UNIT/ML injection Inject 30 Units into the skin 2 (two) times daily with a meal. 25 units with breakfast in am and 35 units with supper   pantoprazole 40 MG tablet Commonly known as:  PROTONIX Take 1 tablet (40 mg total) by mouth daily.         Management plans discussed with the patient and he is in agreement. Stable for discharge   Patient should follow up with pcp  CODE STATUS:     Code Status Orders  (From admission, onward)         Start     Ordered   03/06/19 2257  Full code  Continuous  03/06/19 2256        Code Status History    Date Active Date Inactive Code Status Order ID Comments User Context   02/14/2019 0456 02/15/2019 2030 Full Code [id number]  Harrie Foreman, MD Inpatient   02/02/2019 1729 02/04/2019 1804 Full Code [id number]  Hillary Bow, MD ED   01/03/2019 1944 01/04/2019 2003 Full Code [id number]  Gorden Harms, MD Inpatient   02/23/2018 0126 02/24/2018 1539 Full Code [id number]  Lance Coon, MD ED   04/22/2017 0004 04/22/2017 2213 Full Code [id number]  Lance Coon, MD Inpatient   04/17/2017 2234 04/19/2017 2005 Full Code [id number]  [name], MD Inpatient   04/06/2016 1907 04/10/2016 1928 Full Code [id number]  Loletha Grayer, MD ED      TOTAL TIME TAKING CARE OF THIS PATIENT: 38 minutes.    Note: This dictation was prepared with Dragon dictation along with smaller phrase technology. Any transcriptional errors that result from this process are unintentional.  Bettey Costa M.D on 03/07/2019 at 12:37 PM  Between 7am to 6pm - Pager - [phone] After 6pm go to [email or web address] - password [email or web address] [facility]  Office  [phone]  CC: Primary care physician; [place]

## 2019-03-07 NOTE — Plan of Care (Signed)
Asked charge nurse to hold the patient until Chest x-ray is back as I remembered pt. was in airborne isolation during his last admission. Charge nurse, Raquel, called back and reported she talked with NP Levada Dy. Levada Dy reviewed X-ray and did not think it is necessary to place pt. in an airborne room. Reported that Levada Dy said that the patient was cleared by infectious disease last visit.

## 2019-03-07 NOTE — ED Notes (Signed)
Report called to [employer] on admission floor.

## 2019-03-11 ENCOUNTER — Other Ambulatory Visit: Payer: Self-pay

## 2019-03-11 ENCOUNTER — Emergency Department
Admission: EM | Admit: 2019-03-11 | Discharge: 2019-03-12 | Disposition: A | Discharge status: Home / Self Care | Payer: Self-pay | Attending: Emergency Medicine | Admitting: Emergency Medicine

## 2019-03-11 DIAGNOSIS — R112 Nausea with vomiting, unspecified: Secondary | ICD-10-CM | POA: Insufficient documentation

## 2019-03-11 DIAGNOSIS — E119 Type 2 diabetes mellitus without complications: Secondary | ICD-10-CM | POA: Insufficient documentation

## 2019-03-11 DIAGNOSIS — Z79899 Other long term (current) drug therapy: Secondary | ICD-10-CM | POA: Insufficient documentation

## 2019-03-11 LAB — CBC
HCT: 27.7 % — ABNORMAL LOW (ref 39.0–52.0)
Hemoglobin: 9.1 g/dL — ABNORMAL LOW (ref 13.0–17.0)
MCH: 29.9 pg (ref 26.0–34.0)
MCHC: 32.9 g/dL (ref 30.0–36.0)
MCV: 91.1 fL (ref 80.0–100.0)
Platelets: 344 10*3/uL (ref 150–400)
RBC: 3.04 MIL/uL — ABNORMAL LOW (ref 4.22–5.81)
RDW: 14.4 % (ref 11.5–15.5)
WBC: 4.6 10*3/uL (ref 4.0–10.5)
nRBC: 0 % (ref 0.0–0.2)

## 2019-03-11 LAB — COMPREHENSIVE METABOLIC PANEL
ALT: 33 U/L (ref 0–44)
AST: 29 U/L (ref 15–41)
Albumin: 3.8 g/dL (ref 3.5–5.0)
Alkaline Phosphatase: 131 U/L — ABNORMAL HIGH (ref 38–126)
Anion gap: 11 (ref 5–15)
BUN: 24 mg/dL — ABNORMAL HIGH (ref 6–20)
CO2: 17 mmol/L — ABNORMAL LOW (ref 22–32)
Calcium: 8.7 mg/dL — ABNORMAL LOW (ref 8.9–10.3)
Chloride: 108 mmol/L (ref 98–111)
Creatinine, Ser: 2.44 mg/dL — ABNORMAL HIGH (ref 0.61–1.24)
GFR calc Af Amer: 36 mL/min — ABNORMAL LOW (ref >60)
GFR calc non Af Amer: 31 mL/min — ABNORMAL LOW (ref >60)
Glucose, Bld: 303 mg/dL — ABNORMAL HIGH (ref 70–99)
Potassium: 3.9 mmol/L (ref 3.5–5.1)
Sodium: 136 mmol/L (ref 135–145)
Total Bilirubin: 0.5 mg/dL (ref 0.3–1.2)
Total Protein: 7.4 g/dL (ref 6.5–8.1)

## 2019-03-11 LAB — LIPASE, BLOOD: Lipase: 46 U/L (ref 11–51)

## 2019-03-11 MED ORDER — SODIUM CHLORIDE 0.9 % IV BOLUS
1000.0000 mL | Freq: Once | INTRAVENOUS | Status: AC
  Administered 2019-03-11: 1000 mL via INTRAVENOUS

## 2019-03-11 MED ORDER — MORPHINE SULFATE (PF) 4 MG/ML IV SOLN
4.0000 mg | Freq: Once | INTRAVENOUS | Status: AC
  Administered 2019-03-11: 4 mg via INTRAVENOUS
  Filled 2019-03-11: qty 1

## 2019-03-11 MED ORDER — ONDANSETRON 4 MG PO TBDP: 4.0000 mg | ORAL_TABLET | Freq: Three times a day (TID) | ORAL | 0 refills | Status: DC | PRN

## 2019-03-11 MED ORDER — HYDROCODONE-ACETAMINOPHEN 5-325 MG PO TABS: 1.0000 | ORAL_TABLET | ORAL | 0 refills | Status: DC | PRN

## 2019-03-11 MED ORDER — ONDANSETRON HCL 4 MG/2ML IJ SOLN
4.0000 mg | Freq: Once | INTRAMUSCULAR | Status: AC
  Administered 2019-03-11: 4 mg via INTRAVENOUS
  Filled 2019-03-11: qty 2

## 2019-03-11 NOTE — ED Notes (Signed)
ED Provider at bedside. 

## 2019-03-11 NOTE — ED Provider Notes (Signed)
[facility] EMERGENCY DEPARTMENT Provider Note   CSN: [id number] Arrival date & time: 03/11/19  1449    History   Chief Complaint Chief Complaint  Patient presents with  . Emesis  . Abdominal Pain    HPI Gregory Moody is a 43 y.o. male.     Patient presents to the emergency department due to to lower abdominal pain with vomiting.  Symptoms started this morning.  Patient has a significant past medical history of acute renal failure and diabetes.  He states that he vomits after eating, so he hasn't eaten in 3 days. He complains of feeling hungry.      Past Medical History:  Diagnosis Date  . Diabetes mellitus without complication ([place])   . Gastroparesis   . Tuberculosis     Patient Active Problem List   Diagnosis Date Noted  . Acute renal failure (ARF) ([place]) 03/06/2019  . CAP (community acquired pneumonia) 02/14/2019  . AKI (acute kidney injury) ([place]) 02/02/2019  . ARF (acute renal failure) ([place]) 01/03/2019  . Malnutrition of moderate degree 02/24/2018  . GERD (gastroesophageal reflux disease) 02/23/2018  . Diabetic gastroparesis ([place]) 02/23/2018  . Diabetic foot ulcer ([place]) 02/23/2018  . Aspiration pneumonia ([place]) 04/21/2017  . Hypoglycemia 04/21/2017  . Diabetes mellitus with hyperglycemia ([place]) 04/21/2017  . Hip fracture, unspecified laterality, closed, initial encounter ([place]) 04/17/2017  . Hyperglycemia 04/17/2017  . Protein-calorie malnutrition, severe 04/09/2016  . Cavitary lesion of lung 04/06/2016    Past Surgical History:  Procedure Laterality Date  . ESOPHAGOGASTRODUODENOSCOPY N/A 02/03/2019   Procedure: ESOPHAGOGASTRODUODENOSCOPY (EGD);  Surgeon: Toledo, Benay Pike, MD;  Location: [facility] ENDOSCOPY;  Service: Gastroenterology;  Laterality: N/A;  . NO PAST SURGERIES          Home Medications    Prior to Admission medications   Medication Sig Start Date End Date Taking? Authorizing Provider  FLUoxetine (PROZAC) 20 MG  capsule Take 20 mg by mouth daily.    [provider]  HYDROcodone-acetaminophen (NORCO/VICODIN) 5-325 MG tablet Take 1 tablet by mouth every 4 (four) hours as needed. 03/11/19   Harvest Dark, MD  insulin NPH-regular Human (70-30) 100 UNIT/ML injection Inject 30 Units into the skin 2 (two) times daily with a meal. 25 units with breakfast in am and 35 units with supper 03/07/19   Bettey Costa, MD  ondansetron (ZOFRAN ODT) 4 MG disintegrating tablet Take 1 tablet (4 mg total) by mouth every 8 (eight) hours as needed for nausea or vomiting. 03/11/19   Harvest Dark, MD  pantoprazole (PROTONIX) 40 MG tablet Take 1 tablet (40 mg total) by mouth daily. 03/07/19   Bettey Costa, MD    Family History Family History  Problem Relation Age of Onset  . Diabetes Mother   . Diabetes Father     Social History Social History   Tobacco Use  . Smoking status: Never Smoker  . Smokeless tobacco: Never Used  Substance Use Topics  . Alcohol use: No  . Drug use: No     Allergies   Patient has no known allergies.   Review of Systems Review of Systems  Constitutional: Positive for fatigue. Negative for fever.  HENT: Negative for trouble swallowing.   Eyes: Negative.   Respiratory: Negative for cough, chest tightness and shortness of breath.   Cardiovascular: Negative for chest pain.  Gastrointestinal: Positive for abdominal pain, nausea and vomiting. Negative for abdominal distention, blood in stool and diarrhea.  Genitourinary: Negative for difficulty urinating.  Musculoskeletal: Negative.  Skin: Negative for color change and rash.  Neurological: Negative for dizziness, syncope, light-headedness and headaches.  Psychiatric/Behavioral: Negative.      Physical Exam Updated Vital Signs BP 132/87   Pulse 64   Temp 97.6 F (36.4 C) (Oral)   Resp 17   Wt 61.2 kg   SpO2 100%   BMI 20.53 kg/m   Physical Exam Vitals signs reviewed.  HENT:     Head: Normocephalic.      Mouth/Throat:     Mouth: Mucous membranes are moist.  Cardiovascular:     Rate and Rhythm: Normal rate.  Pulmonary:     Effort: No respiratory distress.     Breath sounds: No wheezing, rhonchi or rales.  Abdominal:     General: Abdomen is flat. Bowel sounds are normal. There is no distension.     Palpations: Abdomen is soft.     Tenderness: There is generalized abdominal tenderness. There is no guarding or rebound.  Skin:    General: Skin is warm and dry.  Neurological:     General: No focal deficit present.     Mental Status: He is alert.  Psychiatric:        Mood and Affect: Mood normal.        Behavior: Behavior normal.      ED Treatments / Results  Labs (all labs ordered are listed, but only abnormal results are displayed) Labs Reviewed  COMPREHENSIVE METABOLIC PANEL - Abnormal; Notable for the following components:      Result Value   CO2 17 (*)    Glucose, Bld 303 (*)    BUN 24 (*)    Creatinine, Ser 2.44 (*)    Calcium 8.7 (*)    Alkaline Phosphatase 131 (*)    GFR calc non Af Amer 31 (*)    GFR calc Af Amer 36 (*)    All other components within normal limits  CBC - Abnormal; Notable for the following components:   RBC 3.04 (*)    Hemoglobin 9.1 (*)    HCT 27.7 (*)    All other components within normal limits  LIPASE, BLOOD    EKG None  Radiology No results found.  Procedures Procedures (including critical care time)  Medications Ordered in ED Medications  morphine 4 MG/ML injection 4 mg (4 mg Intravenous Given 03/11/19 2057)  ondansetron (ZOFRAN) injection 4 mg (4 mg Intravenous Given 03/11/19 2057)  sodium chloride 0.9 % bolus 1,000 mL (0 mLs Intravenous Stopped 03/12/19 0027)     Initial Impression / Assessment and Plan / ED Course  I have reviewed the triage vital signs and the nursing notes.  Pertinent labs & imaging results that were available during my care of the patient were reviewed by me and considered in my medical decision making (see  chart for details).        Patient presents to the emergency department secondary to abdominal pain with vomiting.  He was initially hypotensive when in triage, but once he got to a room and was laid down and given some fluids his pressures responded right away and have remained stable.  Patient states that he feels hungry and was able to eat peanut butter crackers and drink some apple juice without becoming nauseated or vomiting.  Awaiting urinalysis, but patient will likely be discharged home since he is now eating and drinking and his blood pressure is stable.  Care relinquished to Dr. Kerman Passey who will decide on disposition.  Final Clinical Impressions(s) / ED  Diagnoses   Final diagnoses:  Non-intractable vomiting with nausea, unspecified vomiting type    ED Discharge Orders         Ordered    HYDROcodone-acetaminophen (NORCO/VICODIN) 5-325 MG tablet  Every 4 hours PRN     03/11/19 2316    ondansetron (ZOFRAN ODT) 4 MG disintegrating tablet  Every 8 hours PRN     03/11/19 2316           Victorino Dike, FNP 03/12/19 2132    Harvest Dark, MD 03/13/19 2240

## 2019-03-11 NOTE — ED Triage Notes (Signed)
Pt presents via POV c/o lower abd pain and emesis since this am per pt report using interpretor.

## 2019-03-11 NOTE — ED Provider Notes (Signed)
-----------------------------------------   8:04 PM on 03/11/2019 -----------------------------------------  Patient seen in conjunction with nurse practitioner [facility].  Overall the patient appears well, no acute distress.  Patient states he is nauseated but is asking me for a food tray.  No focal tenderness identified on abdominal exam.  We will treat with Zofran, morphine, IV fluids and continue to closely monitor.  Patient's lab work has resulted largely nonrevealing, renal insufficiency however largely unchanged from prior lab work.  Anticipate likely nausea control and discharged home as long as the patient can tolerate food and water.  Patient appears well, he was sleeping comfortably awakens easily to voice.  Patient has eaten and drinking in the emergency department.  We will likely discharge the patient home with pain and nausea medication ideally I would like to check a urinalysis before the patient goes.  Overall patient appears extremely well.   Harvest Dark, MD 03/11/19 2300

## 2019-03-17 ENCOUNTER — Emergency Department: Payer: Self-pay

## 2019-03-17 ENCOUNTER — Other Ambulatory Visit: Payer: Self-pay

## 2019-03-17 ENCOUNTER — Emergency Department
Admission: EM | Admit: 2019-03-17 | Discharge: 2019-03-17 | Disposition: A | Discharge status: Home / Self Care | Payer: Self-pay | Attending: Student in an Organized Health Care Education/Training Program | Admitting: Student in an Organized Health Care Education/Training Program

## 2019-03-17 DIAGNOSIS — E860 Dehydration: Secondary | ICD-10-CM

## 2019-03-17 DIAGNOSIS — Z794 Long term (current) use of insulin: Secondary | ICD-10-CM | POA: Insufficient documentation

## 2019-03-17 DIAGNOSIS — Z1159 Encounter for screening for other viral diseases: Secondary | ICD-10-CM | POA: Insufficient documentation

## 2019-03-17 DIAGNOSIS — Z79899 Other long term (current) drug therapy: Secondary | ICD-10-CM | POA: Insufficient documentation

## 2019-03-17 DIAGNOSIS — E119 Type 2 diabetes mellitus without complications: Secondary | ICD-10-CM | POA: Insufficient documentation

## 2019-03-17 LAB — COMPREHENSIVE METABOLIC PANEL
ALT: 18 U/L (ref 0–44)
AST: 14 U/L — ABNORMAL LOW (ref 15–41)
Albumin: 4 g/dL (ref 3.5–5.0)
Alkaline Phosphatase: 143 U/L — ABNORMAL HIGH (ref 38–126)
Anion gap: 10 (ref 5–15)
BUN: 26 mg/dL — ABNORMAL HIGH (ref 6–20)
CO2: 23 mmol/L (ref 22–32)
Calcium: 9.1 mg/dL (ref 8.9–10.3)
Chloride: 98 mmol/L (ref 98–111)
Creatinine, Ser: 2.65 mg/dL — ABNORMAL HIGH (ref 0.61–1.24)
GFR calc Af Amer: 33 mL/min — ABNORMAL LOW (ref >60)
GFR calc non Af Amer: 28 mL/min — ABNORMAL LOW (ref >60)
Glucose, Bld: 425 mg/dL — ABNORMAL HIGH (ref 70–99)
Potassium: 3.9 mmol/L (ref 3.5–5.1)
Sodium: 131 mmol/L — ABNORMAL LOW (ref 135–145)
Total Bilirubin: 0.6 mg/dL (ref 0.3–1.2)
Total Protein: 8.2 g/dL — ABNORMAL HIGH (ref 6.5–8.1)

## 2019-03-17 LAB — CBC WITH DIFFERENTIAL/PLATELET
Abs Immature Granulocytes: 0.02 10*3/uL (ref 0.00–0.07)
Basophils Absolute: 0 10*3/uL (ref 0.0–0.1)
Basophils Relative: 1 %
Eosinophils Absolute: 0.1 10*3/uL (ref 0.0–0.5)
Eosinophils Relative: 2 %
HCT: 29.6 % — ABNORMAL LOW (ref 39.0–52.0)
Hemoglobin: 9.8 g/dL — ABNORMAL LOW (ref 13.0–17.0)
Immature Granulocytes: 0 %
Lymphocytes Relative: 27 %
Lymphs Abs: 1.8 10*3/uL (ref 0.7–4.0)
MCH: 30.3 pg (ref 26.0–34.0)
MCHC: 33.1 g/dL (ref 30.0–36.0)
MCV: 91.6 fL (ref 80.0–100.0)
Monocytes Absolute: 0.3 10*3/uL (ref 0.1–1.0)
Monocytes Relative: 5 %
Neutro Abs: 4.3 10*3/uL (ref 1.7–7.7)
Neutrophils Relative %: 65 %
Platelets: 304 10*3/uL (ref 150–400)
RBC: 3.23 MIL/uL — ABNORMAL LOW (ref 4.22–5.81)
RDW: 14.1 % (ref 11.5–15.5)
WBC: 6.5 10*3/uL (ref 4.0–10.5)
nRBC: 0 % (ref 0.0–0.2)

## 2019-03-17 LAB — LACTIC ACID, PLASMA: Lactic Acid, Venous: 1.7 mmol/L (ref 0.5–1.9)

## 2019-03-17 LAB — SARS CORONAVIRUS 2 BY RT PCR (HOSPITAL ORDER, PERFORMED IN ~~LOC~~ HOSPITAL LAB): SARS Coronavirus 2: NEGATIVE

## 2019-03-17 IMAGING — CT CT ABDOMEN AND PELVIS WITHOUT CONTRAST
2 of 4 series · 15 of 46 positions shown, 17 images · non-contrast
Comparison: CT left hip [DATE]

CLINICAL DATA: Abdominal pain with nausea and vomiting

EXAM:
CT ABDOMEN AND PELVIS WITHOUT CONTRAST
TECHNIQUE: Multidetector CT imaging of the abdomen and pelvis was performed
following the standard protocol without oral or IV contrast.

[Series 2: routine abd/pel wo · axial · 0.77mm/px · z∈[-1023,-608]mm · 12 of 91 slices shown, 14 images]
[im 4/91  soft-tissue]
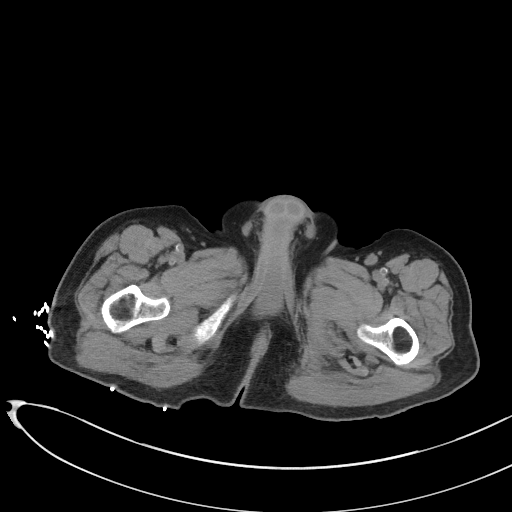
[im 4/91  bone]
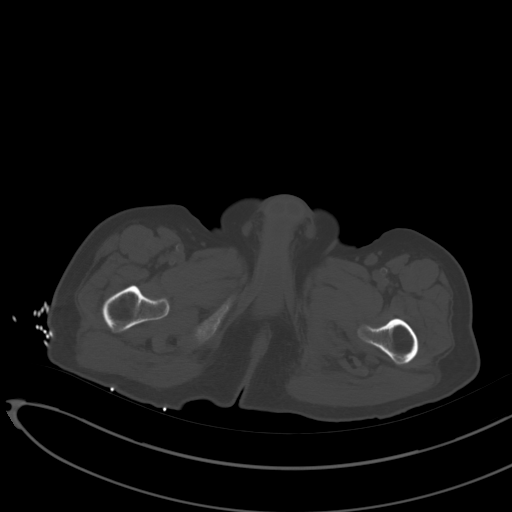
[im 12/91  soft-tissue]
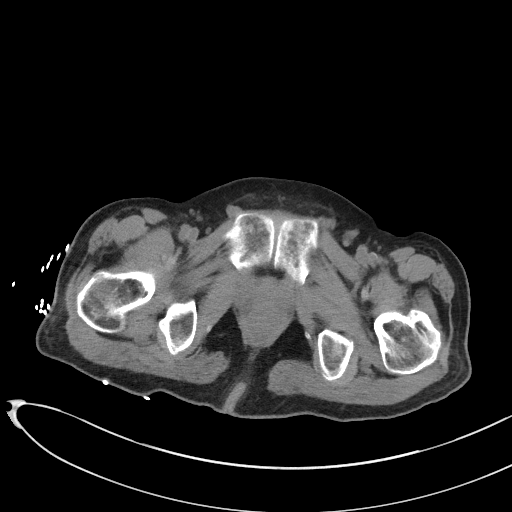
[im 19/91  soft-tissue]
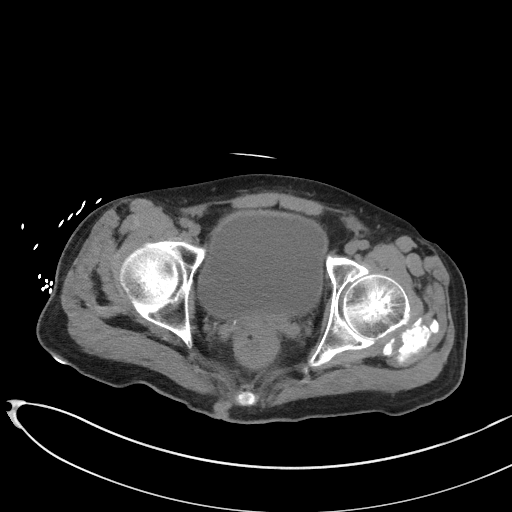
[im 27/91  soft-tissue]
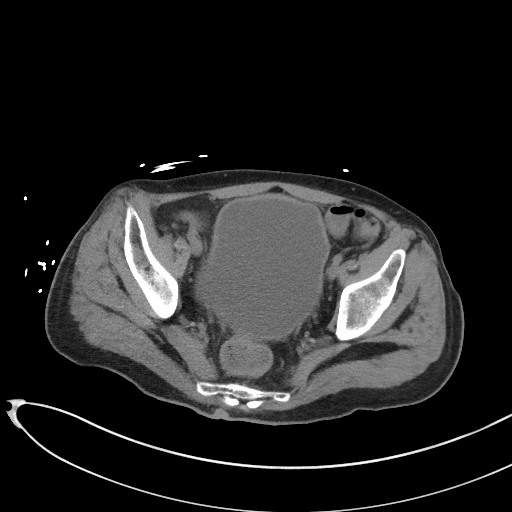
[im 34/91  soft-tissue]
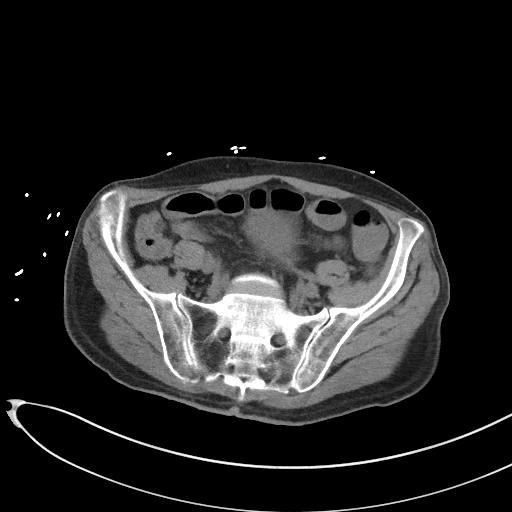
[im 42/91  soft-tissue]
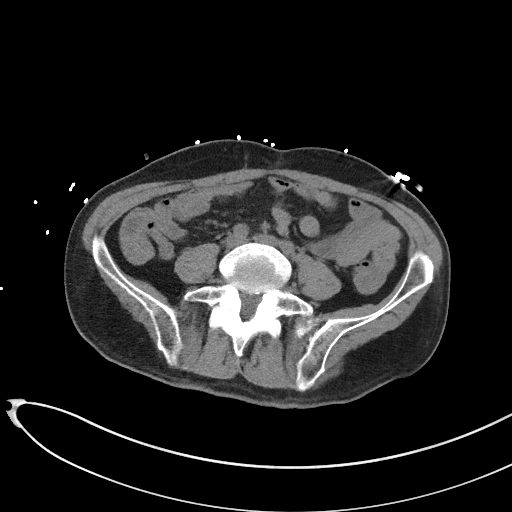
[im 49/91  soft-tissue]
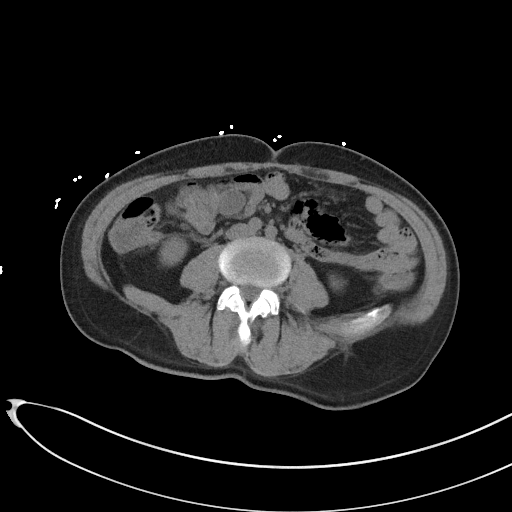
[im 57/91  soft-tissue]
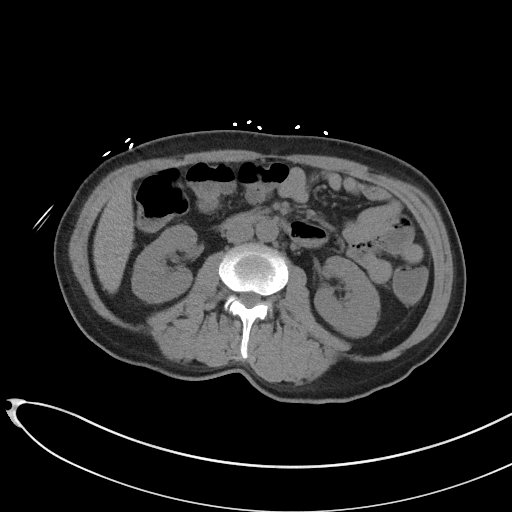
[im 64/91  soft-tissue]
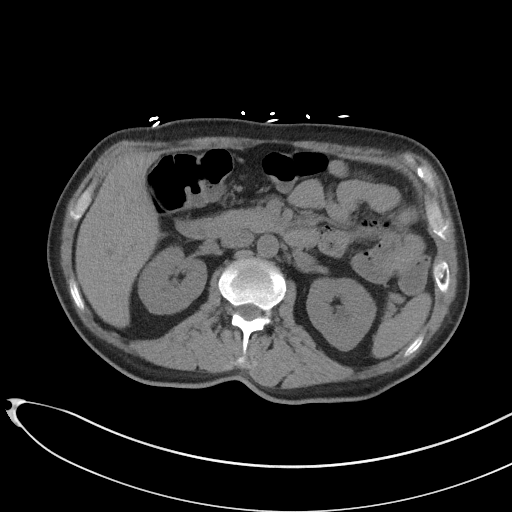
[im 64/91  bone]
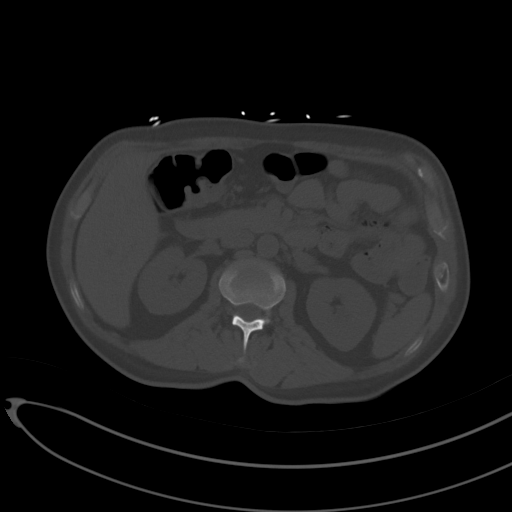
[im 72/91  soft-tissue]
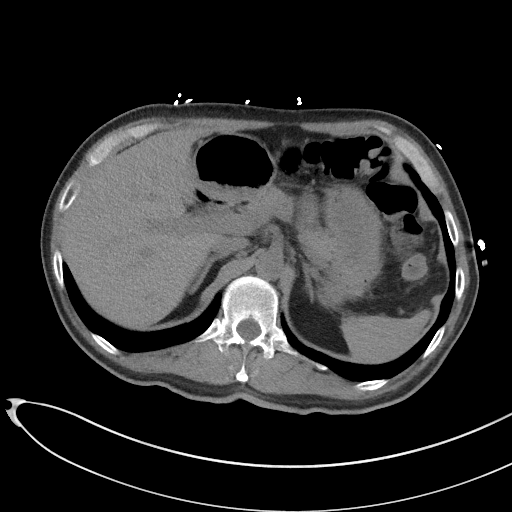
[im 79/91  soft-tissue]
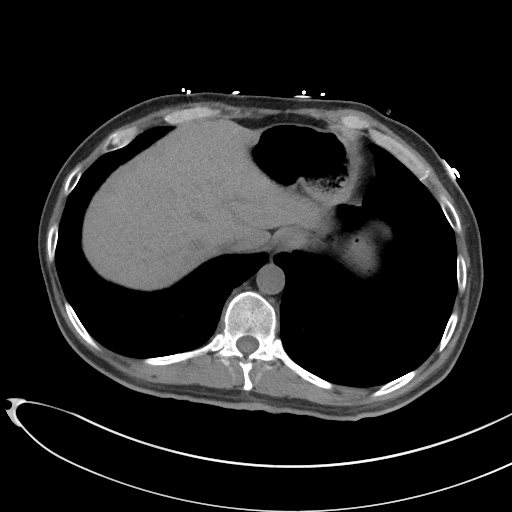
[im 87/91  soft-tissue]
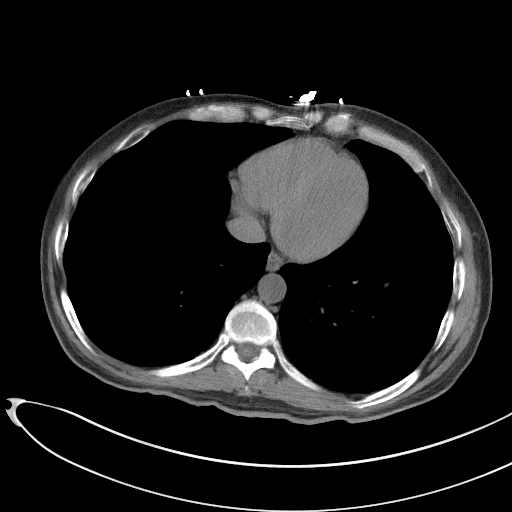

[Series 5: coronal st · coronal · 0.69mm/px · 3 of 86 slices shown]
[im 29/86  soft-tissue]
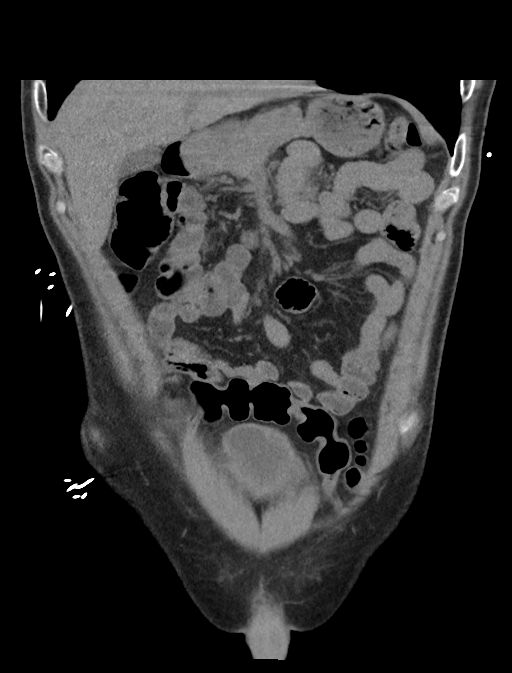
[im 38/86  soft-tissue]
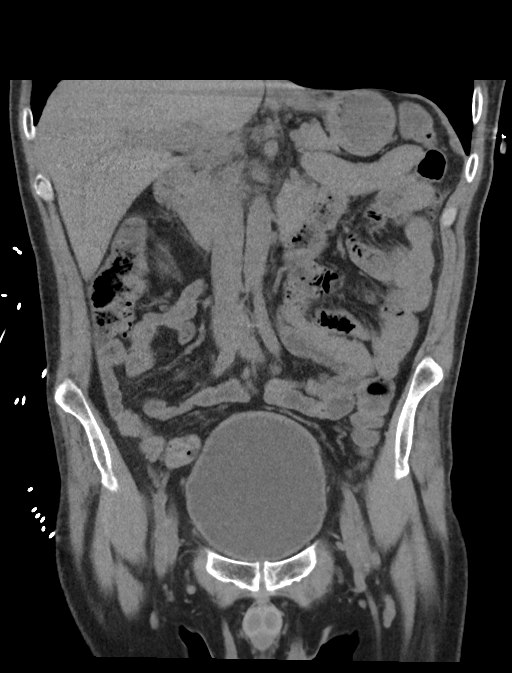
[im 48/86  soft-tissue]
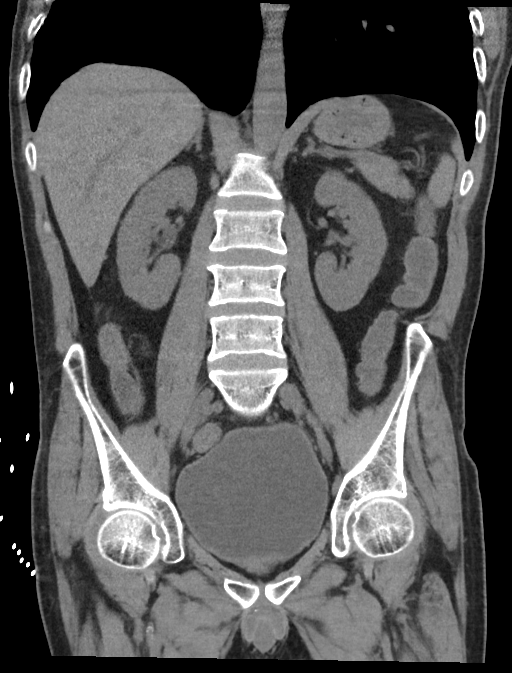

[15 of 46 positions shown; findings below may reference images not displayed]

FINDINGS: Lower chest: There is scarring in the right lung base. No lung edema
or consolidation evident.

Hepatobiliary: No focal liver lesions are evident on this
noncontrast enhanced study. Gallbladder wall is not appreciably
thickened. There is no evident biliary duct dilatation.

Pancreas: There is no pancreatic mass or inflammatory focus.

Spleen: No splenic lesions are evident.

Adrenals/Urinary Tract: Adrenals bilaterally appear unremarkable.
Kidneys bilaterally show no evident mass or hydronephrosis on either
side. There is no evident renal or ureteral calculus on either side.
Urinary bladder is midline with wall thickness within normal limits.

Stomach/Bowel: There is no appreciable bowel wall or mesenteric
thickening. There is no evident bowel obstruction. Terminal ileum
appears normal. There is no free air or portal venous air.

Vascular/Lymphatic: There is no abdominal aortic aneurysm. No
vascular lesions are evident on this noncontrast enhanced study.
There is no demonstrable adenopathy in the abdomen or pelvis.
Subcentimeter inguinal lymph nodes are considered nonspecific.

Reproductive: Prostate and seminal vesicles are normal in size and
contour. There is no evident pelvic mass.

Other: The appendix appears unremarkable. There is no evident
abscess or ascites in the abdomen or pelvis.

Musculoskeletal: There is evidence of an old healed fracture of the
left ischium. There is an old healed fracture of the proximal left
femur with bony remodeling in several fracture fragments in the
region of the greater trochanter. No acute fracture demonstrable. No
blastic or lytic bone lesions are evident. There are pars defects at
L5 bilaterally with slight anterolisthesis of L5 on S1. No
intramuscular or abdominal wall lesions are evident.
IMPRESSION: 1. No appreciable bowel wall thickening or bowel obstruction. No
appreciable diverticular disease. No diverticulitis in particular
evident. Appendix appears normal. There is no abscess in the abdomen
or pelvis.

2. No evident renal or ureteral calculus. No hydronephrosis. Urinary
bladder wall thickness is within normal limits.

3. Prior fractures of the proximal left femur and left ischium with
remodeling. Several small bony fragments are noted adjacent to the
greater trochanter on the left.

4. Pars defects at L5 bilaterally with slight spondylolisthesis at
L5-S1.

## 2019-03-17 IMAGING — DX PORTABLE CHEST - 1 VIEW
1 series · 2 of 2 positions shown · non-contrast
Comparison: [DATE] chest CT and chest radiograph [DATE]

CLINICAL DATA: Tuberculosis

EXAM:
PORTABLE CHEST 1 VIEW

[Series 1: chest ap · 0.14mm/px · 2 of 2 slices shown]
[im 1/2]
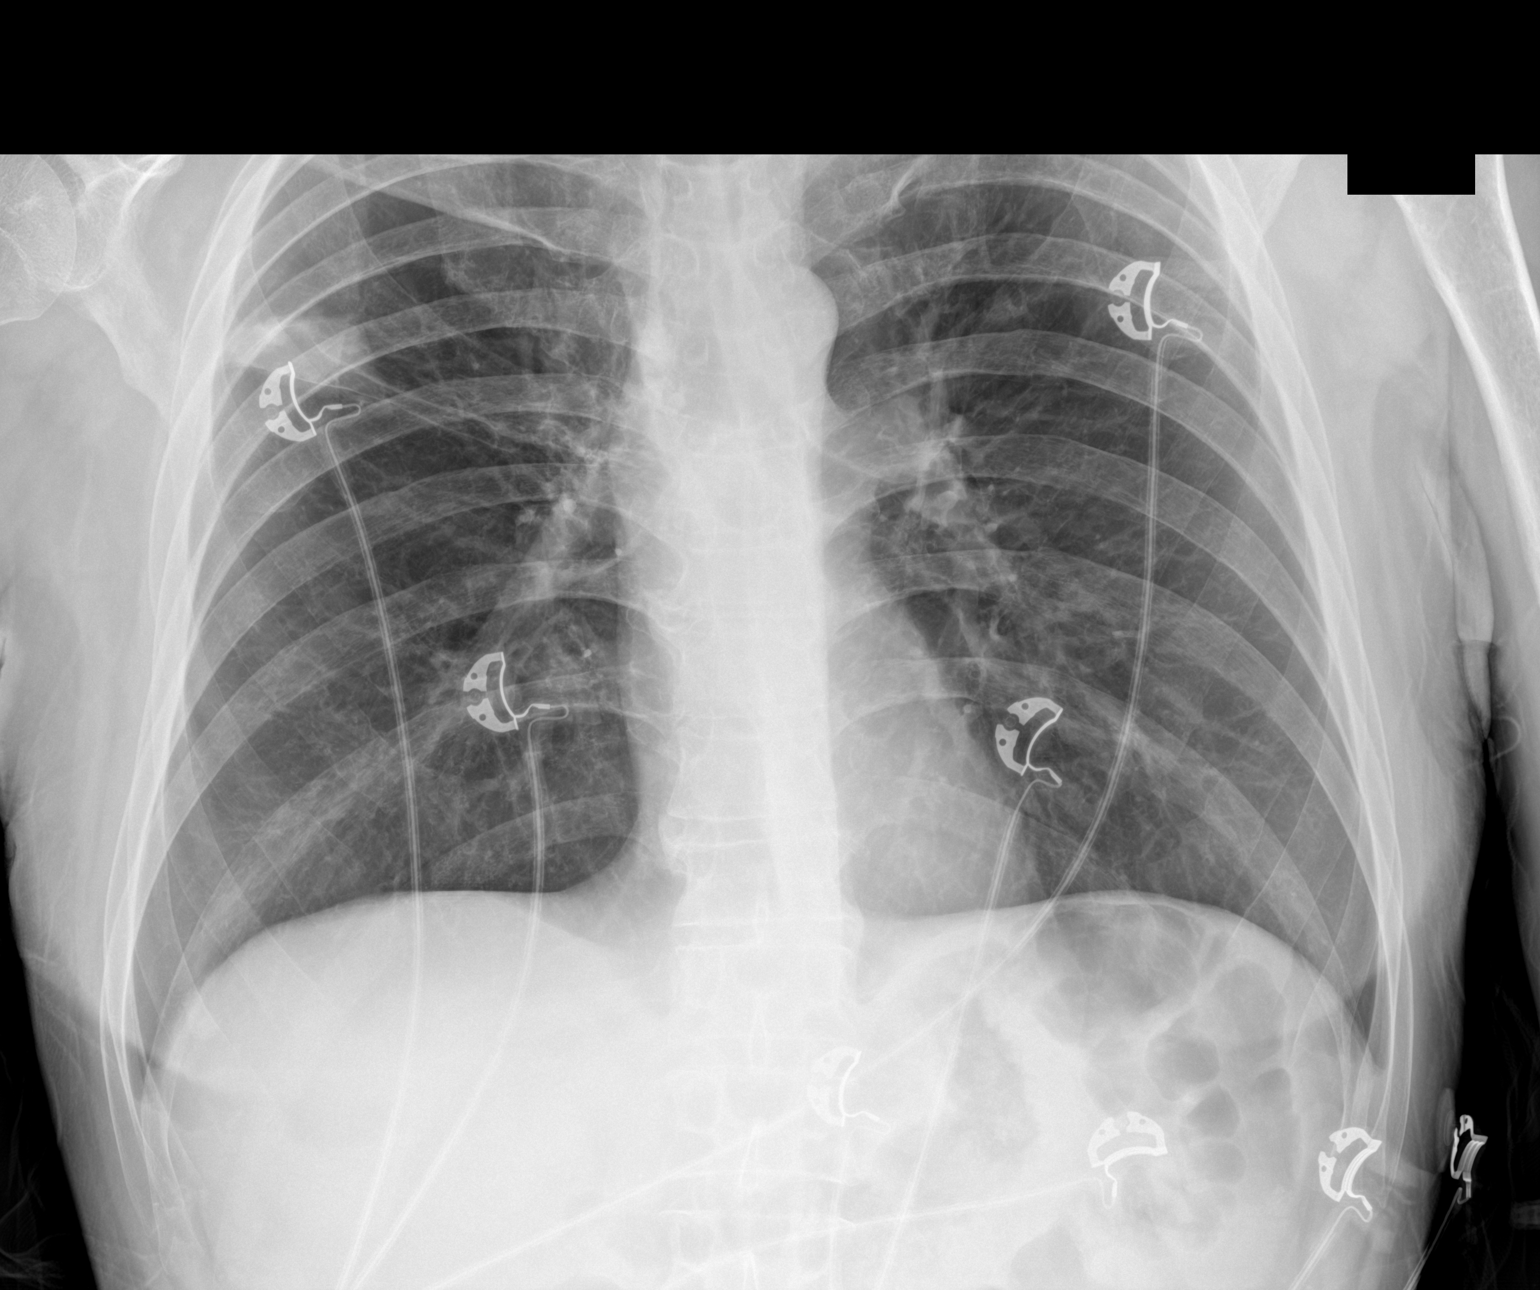
[im 2/2]
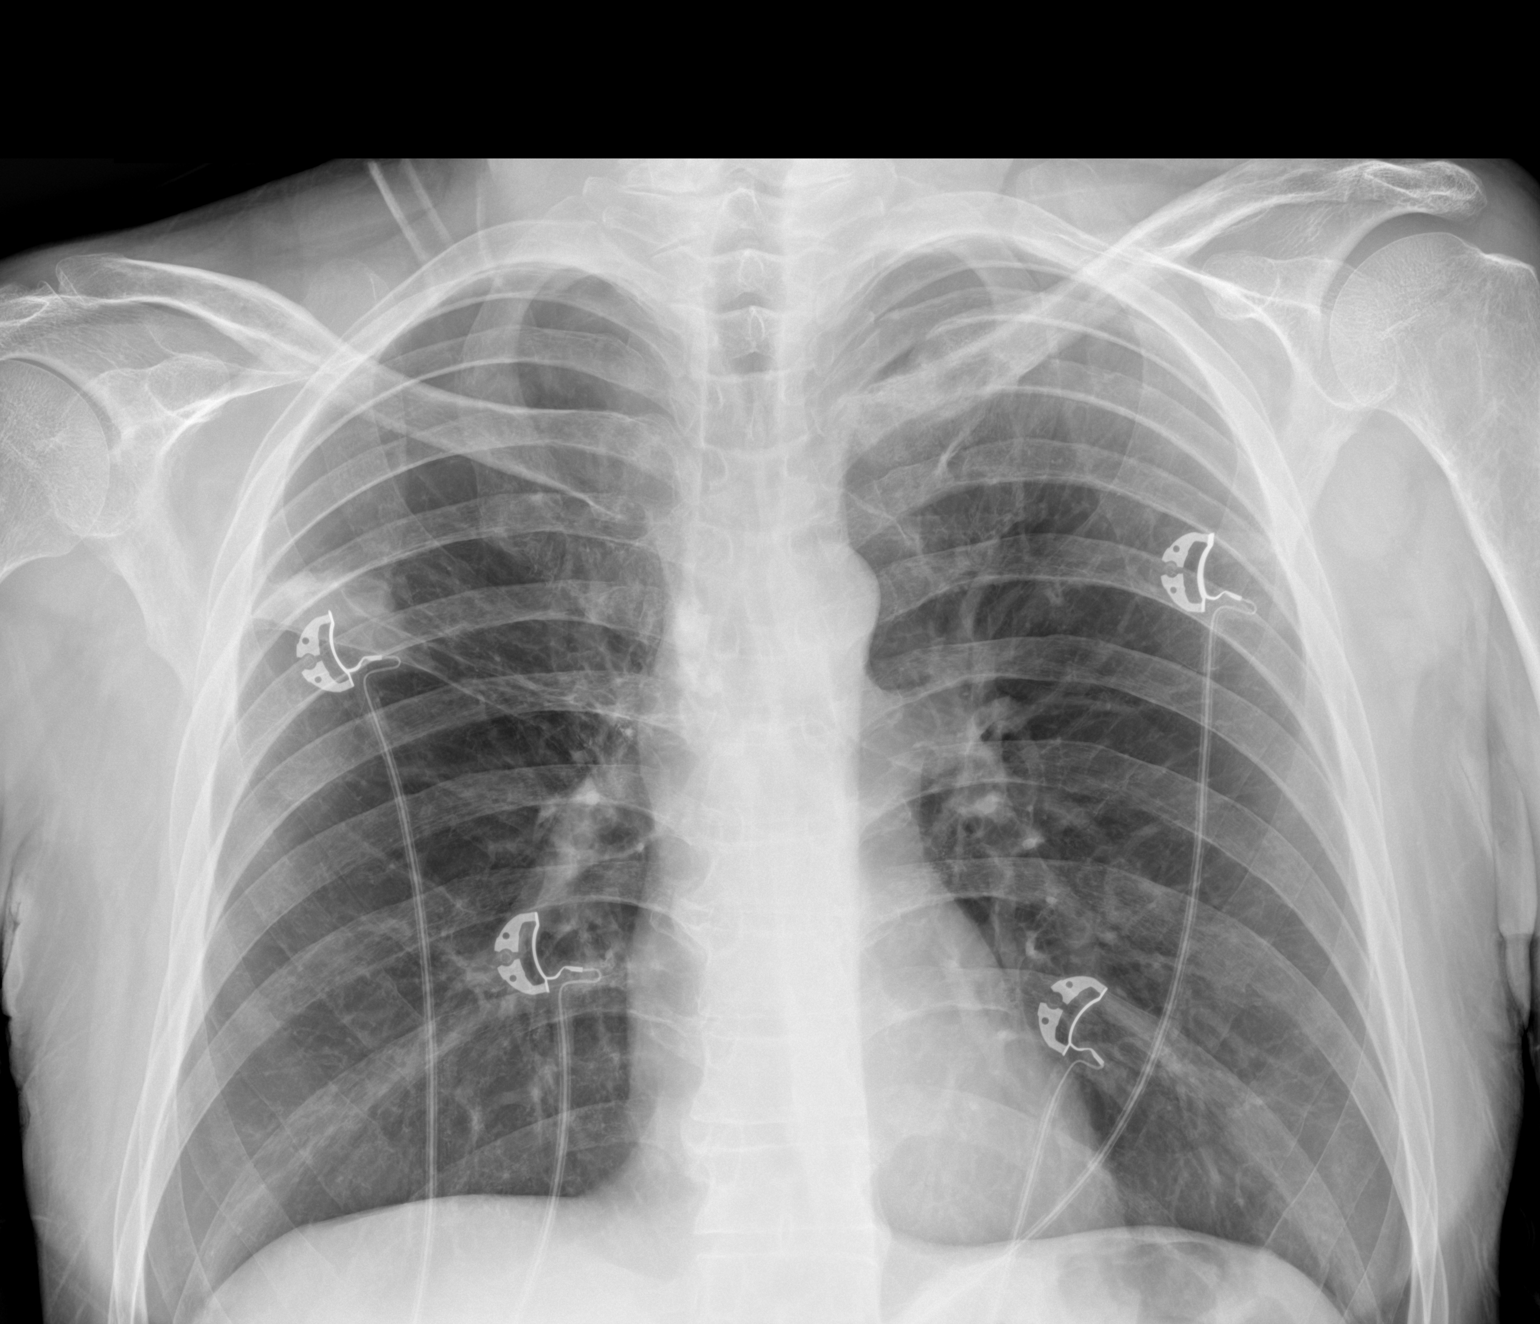

[2 of 2 positions shown; findings below may reference images not displayed]

FINDINGS: Opacification in the right upper lobe anteriorly is stable. Prior
chest CT indicates that this lesion likely represents a focus of
tuberculosis/tuberculoma. There are areas of upper lobe scarring as
well, stable. No new opacity evident. Heart size and pulmonary
vascularity are normal. No adenopathy. No bone lesions.
IMPRESSION: Focal opacity in the right upper lobe which on CT is noted to be
present anteriorly measures 4.1 x 1.5 cm by radiography, similar to
recent studies. Suspect tuberculoma in this area. No new opacity is
evident. Upper lobe scarring is stable. Stable cardiac silhouette.
No adenopathy demonstrable by radiography.

## 2019-03-17 IMAGING — DX LEFT KNEE - COMPLETE 4+ VIEW
4 series · 4 of 4 positions shown · non-contrast
Comparison: None.

CLINICAL DATA: Open wound over patella

EXAM:
LEFT KNEE - COMPLETE 4+ VIEW

[knee ap]
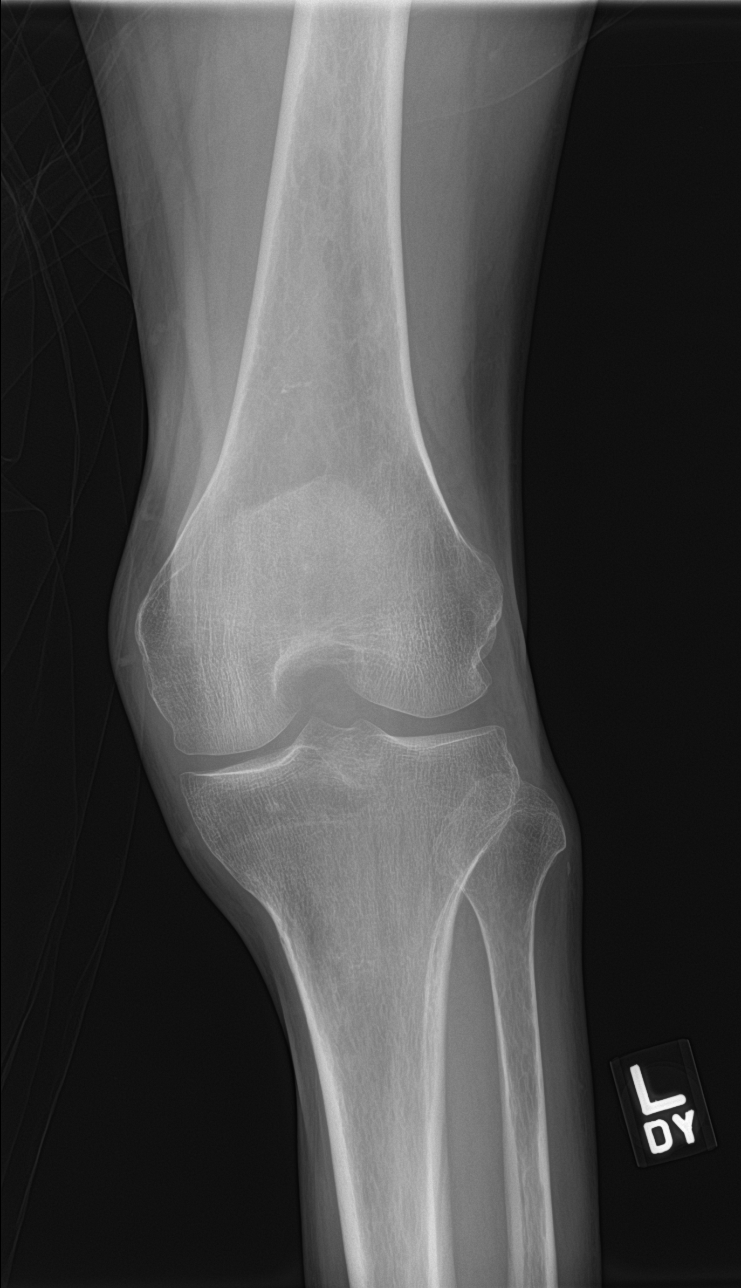

[knee lat]
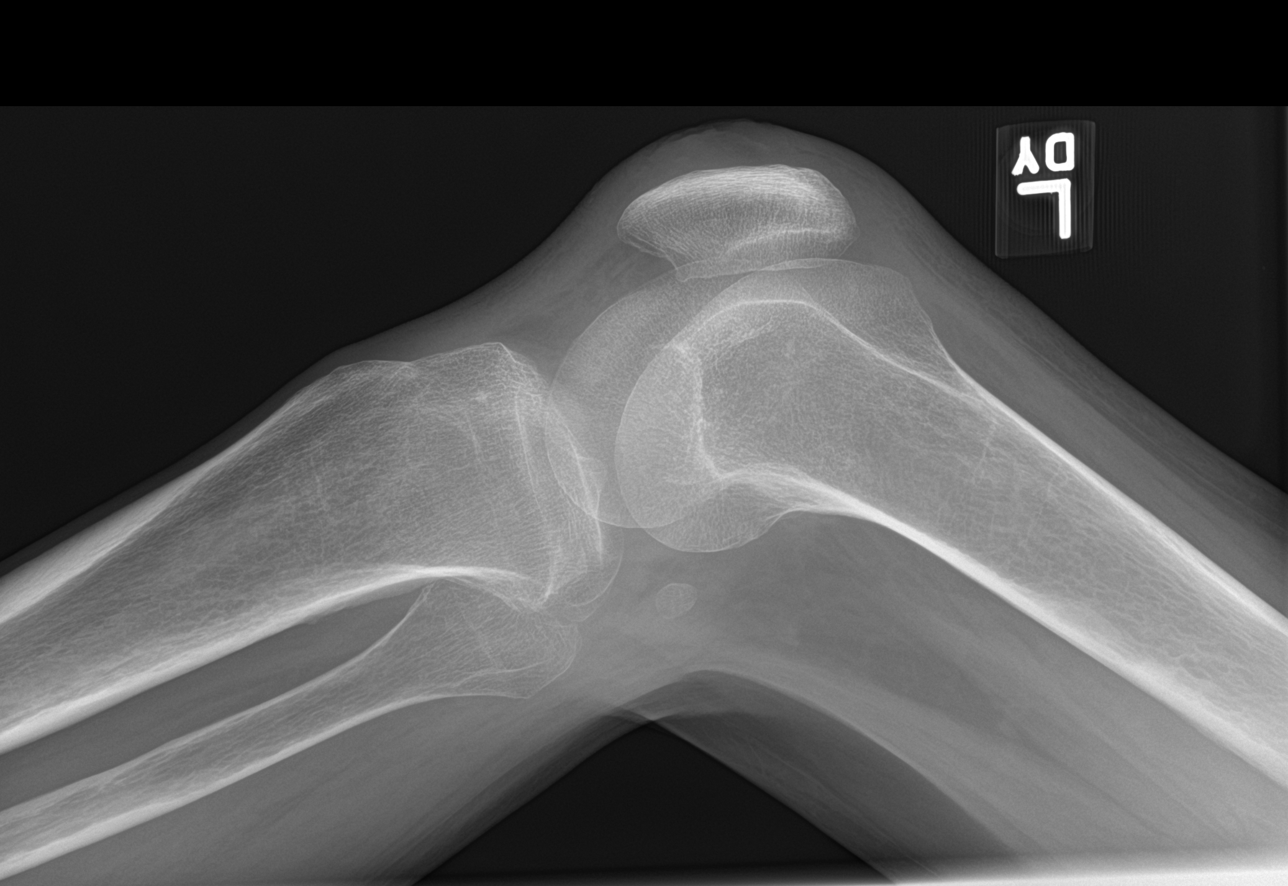

[knee obl (1 of 2)]
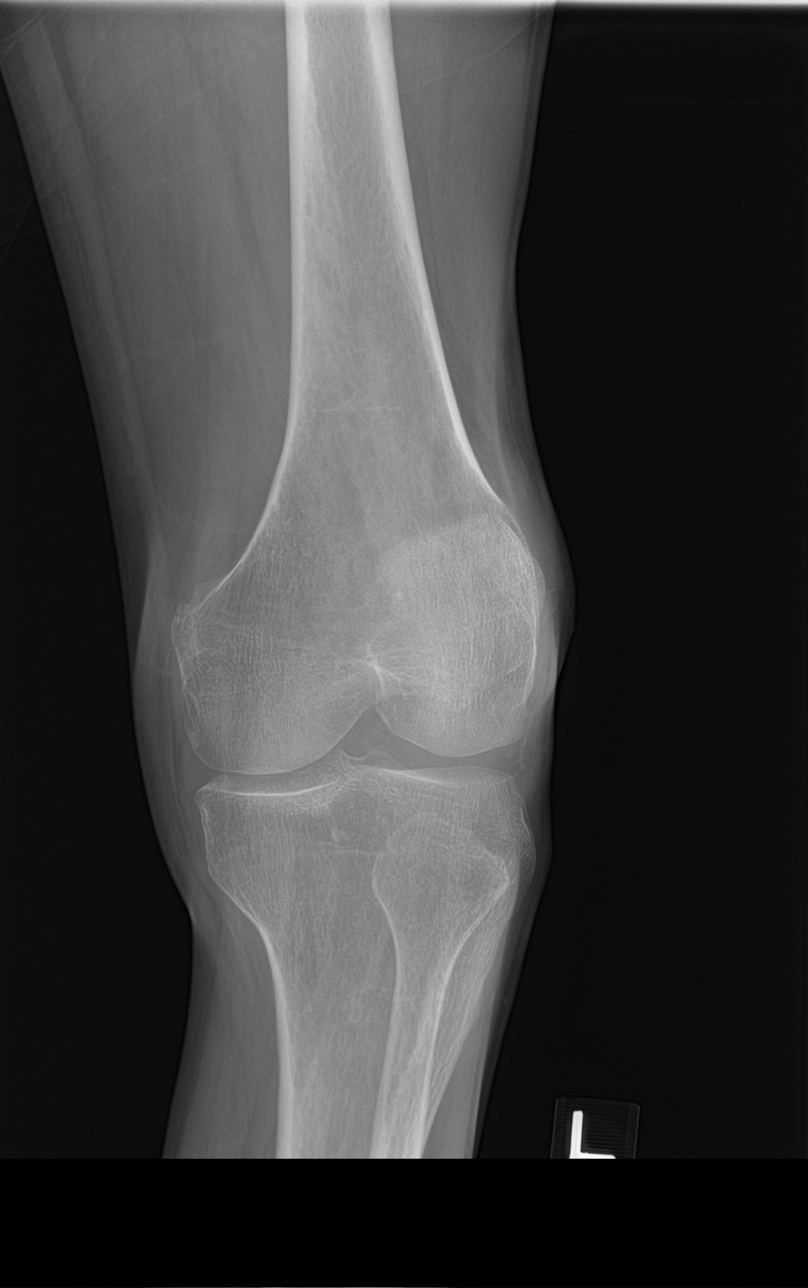

[knee obl (2 of 2)]
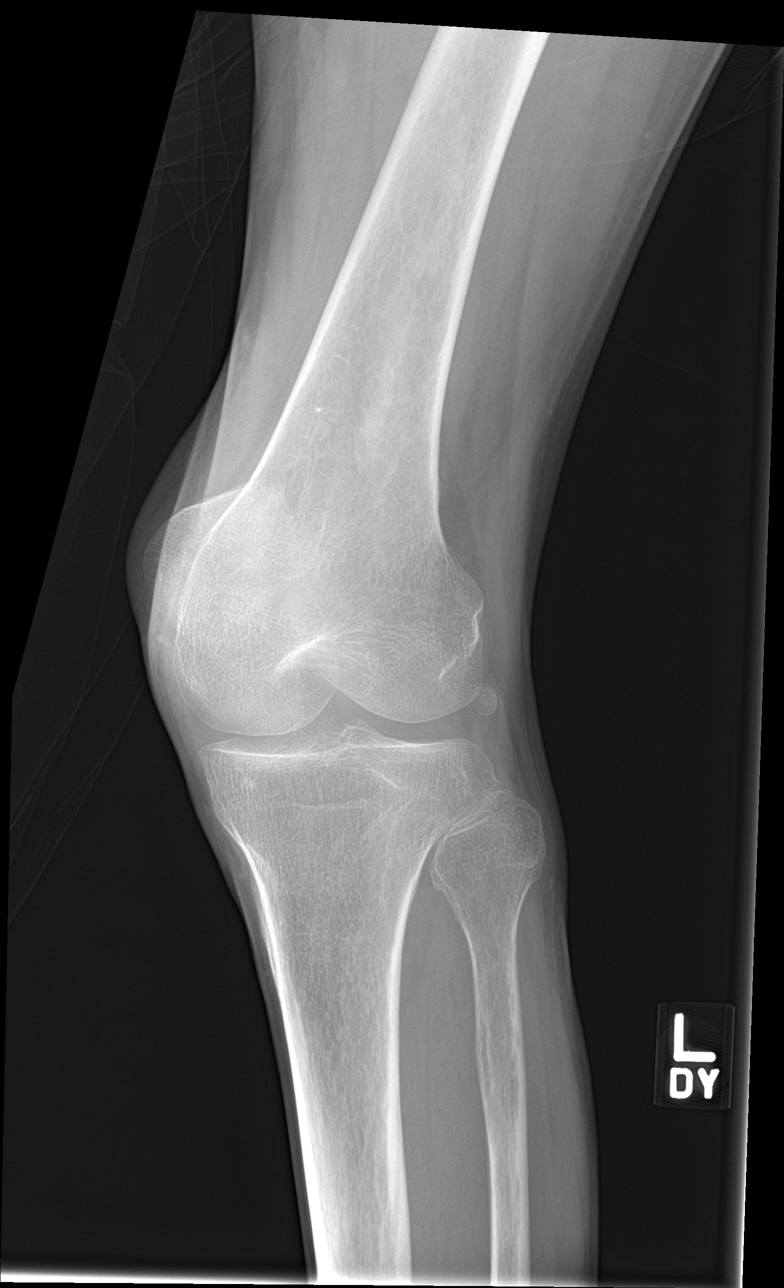

[4 of 4 positions shown; findings below may reference images not displayed]

FINDINGS: Frontal, lateral, and bilateral oblique views were obtained. There
is prepatellar soft tissue edema. No soft tissue air in this area or
radiopaque foreign body.

No fracture or dislocation. No joint effusion. There is mild
narrowing medially and in the patellofemoral joint regions. No
erosive change or bony destruction.
IMPRESSION: Soft tissue edema in the prepatellar region. No soft tissue air in
this area.

No bony destruction. No joint effusion. No fracture or dislocation.
Mild narrowing medially and in the patellofemoral joint.

## 2019-03-17 MED ORDER — SODIUM CHLORIDE 0.9 % IV BOLUS
1000.0000 mL | Freq: Once | INTRAVENOUS | Status: AC
  Administered 2019-03-17: 1000 mL via INTRAVENOUS

## 2019-03-17 MED ORDER — INSULIN ASPART 100 UNIT/ML ~~LOC~~ SOLN
4.0000 [IU] | Freq: Once | SUBCUTANEOUS | Status: AC
  Administered 2019-03-17: 4 [IU] via INTRAVENOUS
  Filled 2019-03-17: qty 1

## 2019-03-17 MED ORDER — ONDANSETRON HCL 4 MG/2ML IJ SOLN
4.0000 mg | Freq: Once | INTRAMUSCULAR | Status: AC
  Administered 2019-03-17: 4 mg via INTRAVENOUS
  Filled 2019-03-17: qty 2

## 2019-03-17 MED ORDER — BACITRACIN ZINC 500 UNIT/GM EX OINT
TOPICAL_OINTMENT | Freq: Once | CUTANEOUS | Status: AC
  Administered 2019-03-17: 1 via TOPICAL
  Filled 2019-03-17: qty 0.9

## 2019-03-17 NOTE — ED Notes (Signed)
Pt states he is feeling better and is asking when he be DC. Will inform EDP.

## 2019-03-17 NOTE — ED Notes (Addendum)
Pt having diarrhea upon arrival to treatment room. This RN and Jonni Sanger assisted pt to move around d/t hypotension.

## 2019-03-17 NOTE — ED Notes (Signed)
EDP at bedside  

## 2019-03-17 NOTE — ED Notes (Signed)
Pt still trying to find a ride

## 2019-03-17 NOTE — ED Provider Notes (Addendum)
[facility] Emergency Department Provider Note    First MD Initiated Contact with Patient 03/17/19 [phone]     (approximate)  I have reviewed the triage vital signs and the nursing notes.   HISTORY  Chief Complaint Abdominal Pain    HPI Gregory Moody is a 43 y.o. male extensive past medical history as listed below presents the ER for nausea vomiting and inability to keep any food down for the past 3 days.  States he feels very weak and is unable to stand up without becoming severely lightheaded.  He denies any diarrhea.  No bloody stools.  No Melena and no hematemesis.  No cough or shortness of breath.    Past Medical History:  Diagnosis Date  . Diabetes mellitus without complication ([place])   . Gastroparesis   . Tuberculosis    Family History  Problem Relation Age of Onset  . Diabetes Mother   . Diabetes Father    Past Surgical History:  Procedure Laterality Date  . ESOPHAGOGASTRODUODENOSCOPY N/A 02/03/2019   Procedure: ESOPHAGOGASTRODUODENOSCOPY (EGD);  Surgeon: Toledo, Benay Pike, MD;  Location: [facility] ENDOSCOPY;  Service: Gastroenterology;  Laterality: N/A;  . NO PAST SURGERIES     Patient Active Problem List   Diagnosis Date Noted  . Acute renal failure (ARF) ([place]) 03/06/2019  . CAP (community acquired pneumonia) 02/14/2019  . AKI (acute kidney injury) ([place]) 02/02/2019  . ARF (acute renal failure) ([place]) 01/03/2019  . Malnutrition of moderate degree 02/24/2018  . GERD (gastroesophageal reflux disease) 02/23/2018  . Diabetic gastroparesis ([place]) 02/23/2018  . Diabetic foot ulcer ([place]) 02/23/2018  . Aspiration pneumonia ([place]) 04/21/2017  . Hypoglycemia 04/21/2017  . Diabetes mellitus with hyperglycemia ([place]) 04/21/2017  . Hip fracture, unspecified laterality, closed, initial encounter ([place]) 04/17/2017  . Hyperglycemia 04/17/2017  . Protein-calorie malnutrition, severe 04/09/2016  . Cavitary lesion of lung 04/06/2016      Prior to  Admission medications   Medication Sig Start Date End Date Taking? Authorizing Provider  FLUoxetine (PROZAC) 20 MG capsule Take 20 mg by mouth daily.    [provider]  HYDROcodone-acetaminophen (NORCO/VICODIN) 5-325 MG tablet Take 1 tablet by mouth every 4 (four) hours as needed. 03/11/19   Harvest Dark, MD  insulin NPH-regular Human (70-30) 100 UNIT/ML injection Inject 30 Units into the skin 2 (two) times daily with a meal. 25 units with breakfast in am and 35 units with supper 03/07/19   Bettey Costa, MD  ondansetron (ZOFRAN ODT) 4 MG disintegrating tablet Take 1 tablet (4 mg total) by mouth every 8 (eight) hours as needed for nausea or vomiting. 03/11/19   Harvest Dark, MD  pantoprazole (PROTONIX) 40 MG tablet Take 1 tablet (40 mg total) by mouth daily. 03/07/19   Bettey Costa, MD    Allergies Patient has no known allergies.    Social History Social History   Tobacco Use  . Smoking status: Never Smoker  . Smokeless tobacco: Never Used  Substance Use Topics  . Alcohol use: No  . Drug use: No    Review of Systems Patient denies headaches, rhinorrhea, blurry vision, numbness, shortness of breath, chest pain, edema, cough, abdominal pain, nausea, vomiting, diarrhea, dysuria, fevers, rashes or hallucinations unless otherwise stated above in HPI. ____________________________________________   PHYSICAL EXAM:  VITAL SIGNS: Vitals:   03/17/19 1230 03/17/19 1330  BP: 115/80 123/82  Pulse: 71 71  Resp: (!) 21 13  Temp:    SpO2: 100% 99%    Constitutional: Alert and oriented. Ill  appearing Eyes: Conjunctivae are normal.  Head: Atraumatic. Nose: No congestion/rhinnorhea. Mouth/Throat: Mucous membranes are moist.   Neck: No stridor. Painless ROM.  Cardiovascular: Normal rate, regular rhythm. Grossly normal heart sounds.  Good peripheral circulation. Respiratory: Normal respiratory effort.  No retractions. Lungs CTAB. Gastrointestinal: Soft and nontender. No  distention. No abdominal bruits. No CVA tenderness. Genitourinary deferred Musculoskeletal: abrasion and  wound to left anterir knee, no effusion.  Surrounding cellulitis.  No joint effusions. Neurologic:  Normal speech and language. No gross focal neurologic deficits are appreciated. No facial droop Skin:  Skin is warm, dry.  Quarter size abrasion to the left knee described above Psychiatric: Mood and affect are normal. Speech and behavior are normal.  ____________________________________________   LABS (all labs ordered are listed, but only abnormal results are displayed)  Results for orders placed or performed during the hospital encounter of 03/17/19 (from the past 24 hour(s))  Lactic acid, plasma     Status: None   Collection Time: 03/17/19  9:54 AM  Result Value Ref Range   Lactic Acid, Venous 1.7 0.5 - 1.9 mmol/L  Comprehensive metabolic panel     Status: Abnormal   Collection Time: 03/17/19  9:54 AM  Result Value Ref Range   Sodium 131 (L) 135 - 145 mmol/L   Potassium 3.9 3.5 - 5.1 mmol/L   Chloride 98 98 - 111 mmol/L   CO2 23 22 - 32 mmol/L   Glucose, Bld 425 (H) 70 - 99 mg/dL   BUN 26 (H) 6 - 20 mg/dL   Creatinine, Ser 2.65 (H) 0.61 - 1.24 mg/dL   Calcium 9.1 8.9 - 10.3 mg/dL   Total Protein 8.2 (H) 6.5 - 8.1 g/dL   Albumin 4.0 3.5 - 5.0 g/dL   AST 14 (L) 15 - 41 U/L   ALT 18 0 - 44 U/L   Alkaline Phosphatase 143 (H) 38 - 126 U/L   Total Bilirubin 0.6 0.3 - 1.2 mg/dL   GFR calc non Af Amer 28 (L) >60 mL/min   GFR calc Af Amer 33 (L) >60 mL/min   Anion gap 10 5 - 15  CBC WITH DIFFERENTIAL     Status: Abnormal   Collection Time: 03/17/19  9:54 AM  Result Value Ref Range   WBC 6.5 4.0 - 10.5 K/uL   RBC 3.23 (L) 4.22 - 5.81 MIL/uL   Hemoglobin 9.8 (L) 13.0 - 17.0 g/dL   HCT 29.6 (L) 39.0 - 52.0 %   MCV 91.6 80.0 - 100.0 fL   MCH 30.3 26.0 - 34.0 pg   MCHC 33.1 30.0 - 36.0 g/dL   RDW 14.1 11.5 - 15.5 %   Platelets 304 150 - 400 K/uL   nRBC 0.0 0.0 - 0.2 %    Neutrophils Relative % 65 %   Neutro Abs 4.3 1.7 - 7.7 K/uL   Lymphocytes Relative 27 %   Lymphs Abs 1.8 0.7 - 4.0 K/uL   Monocytes Relative 5 %   Monocytes Absolute 0.3 0.1 - 1.0 K/uL   Eosinophils Relative 2 %   Eosinophils Absolute 0.1 0.0 - 0.5 K/uL   Basophils Relative 1 %   Basophils Absolute 0.0 0.0 - 0.1 K/uL   Immature Granulocytes 0 %   Abs Immature Granulocytes 0.02 0.00 - 0.07 K/uL  SARS Coronavirus 2 (CEPHEID - Performed in [facility] hospital lab), Hosp Order     Status: None   Collection Time: 03/17/19  9:54 AM  Result Value Ref Range   SARS  Coronavirus 2 NEGATIVE NEGATIVE   ____________________________________________  EKG My review and personal interpretation at Time: 9:59   Indication: hypotensino  Rate: 75  Rhythm: sinus Axis: normal Other: normal intervals, no stemi ____________________________________________  RADIOLOGY  I personally reviewed all radiographic images ordered to evaluate for the above acute complaints and reviewed radiology reports and findings.  These findings were personally discussed with the patient.  Please see medical record for radiology report.  ____________________________________________   PROCEDURES  Procedure(s) performed:  Procedures    Critical Care performed: no ____________________________________________   INITIAL IMPRESSION / ASSESSMENT AND PLAN / ED COURSE  Pertinent labs & imaging results that were available during my care of the patient were reviewed by me and considered in my medical decision making (see chart for details).   DDX: dehydration, electrolyte abn, anemia, aki, enteritis, gastroparesis  Gregory Moody is a 43 y.o. who presents to the ED with symptoms as described above.  Patient chronically ill-appearing and hypotensive but mentating well.  Blood will be sent for the blood differential.  Will order IV fluids.  Anticipate this secondary to dehydration but patient with extensive complex past  medical history requiring more extensive work-up.  The patient will be placed on continuous pulse oximetry and telemetry for monitoring.  Laboratory evaluation will be sent to evaluate for the above complaints.     Clinical Course as of Mar 16 1352  Sat Mar 17, 2019  1052 Pressure improving with IV hydration.  Blood work roughly at baseline.  Lactate normal.  Will order CT imaging to further evaluate his complaint of epigastric abdominal pain.   [PR]  0947 Patient is tolerating oral hydration.  Blood pressure seems to be stabilizing after IV hydration.  Blood work is otherwise at baseline.  Will observe patient given his hypotension but anticipate patient will be appropriate for discharge home.   [PR]  1352 Patient feels much improved and is requesting discharge home.  This point I do believe he stable and appropriate for outpatient follow-up.  He denies any dysuria.  Have also given him referral to wound care clinic given his protein calorie malnutrition do worry that he is can have some poor wound healing on that left knee.  No evidence of osteo-.  Discussed signs and symptoms for which the patient should return to the ER.   [PR]    Clinical Course User Index [PR] Merlyn Lot, MD    The patient was evaluated in Emergency Department today for the symptoms described in the history of present illness. He/she was evaluated in the context of the global COVID-19 pandemic, which necessitated consideration that the patient might be at risk for infection with the SARS-CoV-2 virus that causes COVID-19. Institutional protocols and algorithms that pertain to the evaluation of patients at risk for COVID-19 are in a state of rapid change based on information released by regulatory bodies including the CDC and federal and state organizations. These policies and algorithms were followed during the patient's care in the ED.  As part of my medical decision making, I reviewed the following data within the  [place] notes reviewed and incorporated, Labs reviewed, notes from prior ED visits and [place] Controlled Substance Database   ____________________________________________   FINAL CLINICAL IMPRESSION(S) / ED DIAGNOSES  Final diagnoses:  Dehydration      NEW MEDICATIONS STARTED DURING THIS VISIT:  New Prescriptions   No medications on file     Note:  This document was prepared using Dragon voice  recognition software and may include unintentional dictation errors.    Merlyn Lot, MD 03/17/19 1354    Merlyn Lot, MD 03/17/19 1416

## 2019-03-17 NOTE — ED Notes (Signed)
Pt given meal tray and water 

## 2019-03-17 NOTE — ED Notes (Addendum)
Pt back to bed with minimal assist

## 2019-03-17 NOTE — ED Notes (Signed)
Pt taken to CT via stretcher.

## 2019-03-17 NOTE — ED Notes (Signed)
Pt had one episode of diarrhea

## 2019-03-17 NOTE — ED Notes (Signed)
Pt given phone to call for ride.

## 2019-03-17 NOTE — ED Notes (Signed)
X-ray at bedside

## 2019-03-17 NOTE — ED Notes (Signed)
Pt on phone with taxi company

## 2019-03-17 NOTE — ED Triage Notes (Signed)
Pt c/o abd pain with N/V/D for over a week, was seen here  5/31 with similar sx.

## 2019-03-17 NOTE — ED Notes (Signed)
Pt given another cup of water

## 2019-03-17 NOTE — ED Notes (Signed)
Pt assisted to toilet with minimal assist

## 2019-03-17 NOTE — ED Notes (Addendum)
Wound on right knee cleaned, placed bacitracin, and wrapped in gauze

## 2019-03-22 LAB — CULTURE, BLOOD (ROUTINE X 2)
Culture: NO GROWTH
Culture: NO GROWTH
Special Requests: ADEQUATE
Special Requests: ADEQUATE

## 2019-03-30 ENCOUNTER — Inpatient Hospital Stay
Admission: EM | Admit: 2019-03-30 | Discharge: 2019-04-01 | DRG: 638 | Disposition: A | Discharge status: Home / Self Care | Payer: Self-pay | Attending: Internal Medicine | Admitting: Internal Medicine

## 2019-03-30 ENCOUNTER — Emergency Department: Payer: Self-pay

## 2019-03-30 ENCOUNTER — Other Ambulatory Visit: Payer: Self-pay

## 2019-03-30 DIAGNOSIS — N179 Acute kidney failure, unspecified: Secondary | ICD-10-CM | POA: Diagnosis present

## 2019-03-30 DIAGNOSIS — E1122 Type 2 diabetes mellitus with diabetic chronic kidney disease: Secondary | ICD-10-CM | POA: Diagnosis present

## 2019-03-30 DIAGNOSIS — Z7982 Long term (current) use of aspirin: Secondary | ICD-10-CM

## 2019-03-30 DIAGNOSIS — T68XXXA Hypothermia, initial encounter: Secondary | ICD-10-CM | POA: Diagnosis present

## 2019-03-30 DIAGNOSIS — E1143 Type 2 diabetes mellitus with diabetic autonomic (poly)neuropathy: Secondary | ICD-10-CM | POA: Diagnosis present

## 2019-03-30 DIAGNOSIS — Z20828 Contact with and (suspected) exposure to other viral communicable diseases: Secondary | ICD-10-CM | POA: Diagnosis present

## 2019-03-30 DIAGNOSIS — D631 Anemia in chronic kidney disease: Secondary | ICD-10-CM | POA: Diagnosis present

## 2019-03-30 DIAGNOSIS — E860 Dehydration: Secondary | ICD-10-CM | POA: Diagnosis present

## 2019-03-30 DIAGNOSIS — Z833 Family history of diabetes mellitus: Secondary | ICD-10-CM

## 2019-03-30 DIAGNOSIS — K3184 Gastroparesis: Secondary | ICD-10-CM | POA: Diagnosis present

## 2019-03-30 DIAGNOSIS — E162 Hypoglycemia, unspecified: Secondary | ICD-10-CM | POA: Diagnosis present

## 2019-03-30 DIAGNOSIS — E11649 Type 2 diabetes mellitus with hypoglycemia without coma: Principal | ICD-10-CM | POA: Diagnosis present

## 2019-03-30 DIAGNOSIS — N183 Chronic kidney disease, stage 3 (moderate): Secondary | ICD-10-CM | POA: Diagnosis present

## 2019-03-30 DIAGNOSIS — L97829 Non-pressure chronic ulcer of other part of left lower leg with unspecified severity: Secondary | ICD-10-CM | POA: Diagnosis present

## 2019-03-30 DIAGNOSIS — K219 Gastro-esophageal reflux disease without esophagitis: Secondary | ICD-10-CM | POA: Diagnosis present

## 2019-03-30 DIAGNOSIS — A0472 Enterocolitis due to Clostridium difficile, not specified as recurrent: Secondary | ICD-10-CM | POA: Diagnosis present

## 2019-03-30 DIAGNOSIS — L8990 Pressure ulcer of unspecified site, unspecified stage: Secondary | ICD-10-CM | POA: Insufficient documentation

## 2019-03-30 LAB — BASIC METABOLIC PANEL
Anion gap: 10 (ref 5–15)
BUN: 32 mg/dL — ABNORMAL HIGH (ref 6–20)
CO2: 16 mmol/L — ABNORMAL LOW (ref 22–32)
Calcium: 8 mg/dL — ABNORMAL LOW (ref 8.9–10.3)
Chloride: 110 mmol/L (ref 98–111)
Creatinine, Ser: 2.43 mg/dL — ABNORMAL HIGH (ref 0.61–1.24)
GFR calc Af Amer: 36 mL/min — ABNORMAL LOW (ref >60)
GFR calc non Af Amer: 31 mL/min — ABNORMAL LOW (ref >60)
Glucose, Bld: 113 mg/dL — ABNORMAL HIGH (ref 70–99)
Potassium: 3.7 mmol/L (ref 3.5–5.1)
Sodium: 136 mmol/L (ref 135–145)

## 2019-03-30 LAB — CBC
HCT: 25.5 % — ABNORMAL LOW (ref 39.0–52.0)
Hemoglobin: 8.4 g/dL — ABNORMAL LOW (ref 13.0–17.0)
MCH: 30.4 pg (ref 26.0–34.0)
MCHC: 32.9 g/dL (ref 30.0–36.0)
MCV: 92.4 fL (ref 80.0–100.0)
Platelets: 272 10*3/uL (ref 150–400)
RBC: 2.76 MIL/uL — ABNORMAL LOW (ref 4.22–5.81)
RDW: 14 % (ref 11.5–15.5)
WBC: 5.5 10*3/uL (ref 4.0–10.5)
nRBC: 0 % (ref 0.0–0.2)

## 2019-03-30 LAB — GLUCOSE, CAPILLARY
Glucose-Capillary: 99 mg/dL (ref 70–99)
Glucose-Capillary: 125 mg/dL — ABNORMAL HIGH (ref 70–99)
Glucose-Capillary: 288 mg/dL — ABNORMAL HIGH (ref 70–99)

## 2019-03-30 IMAGING — CR CHEST - 2 VIEW
2 series · 2 of 2 positions shown · non-contrast
Comparison: [DATE], [DATE], CT [DATE]

CLINICAL DATA: Hypothermia

EXAM:
CHEST - 2 VIEW

[chest pa]
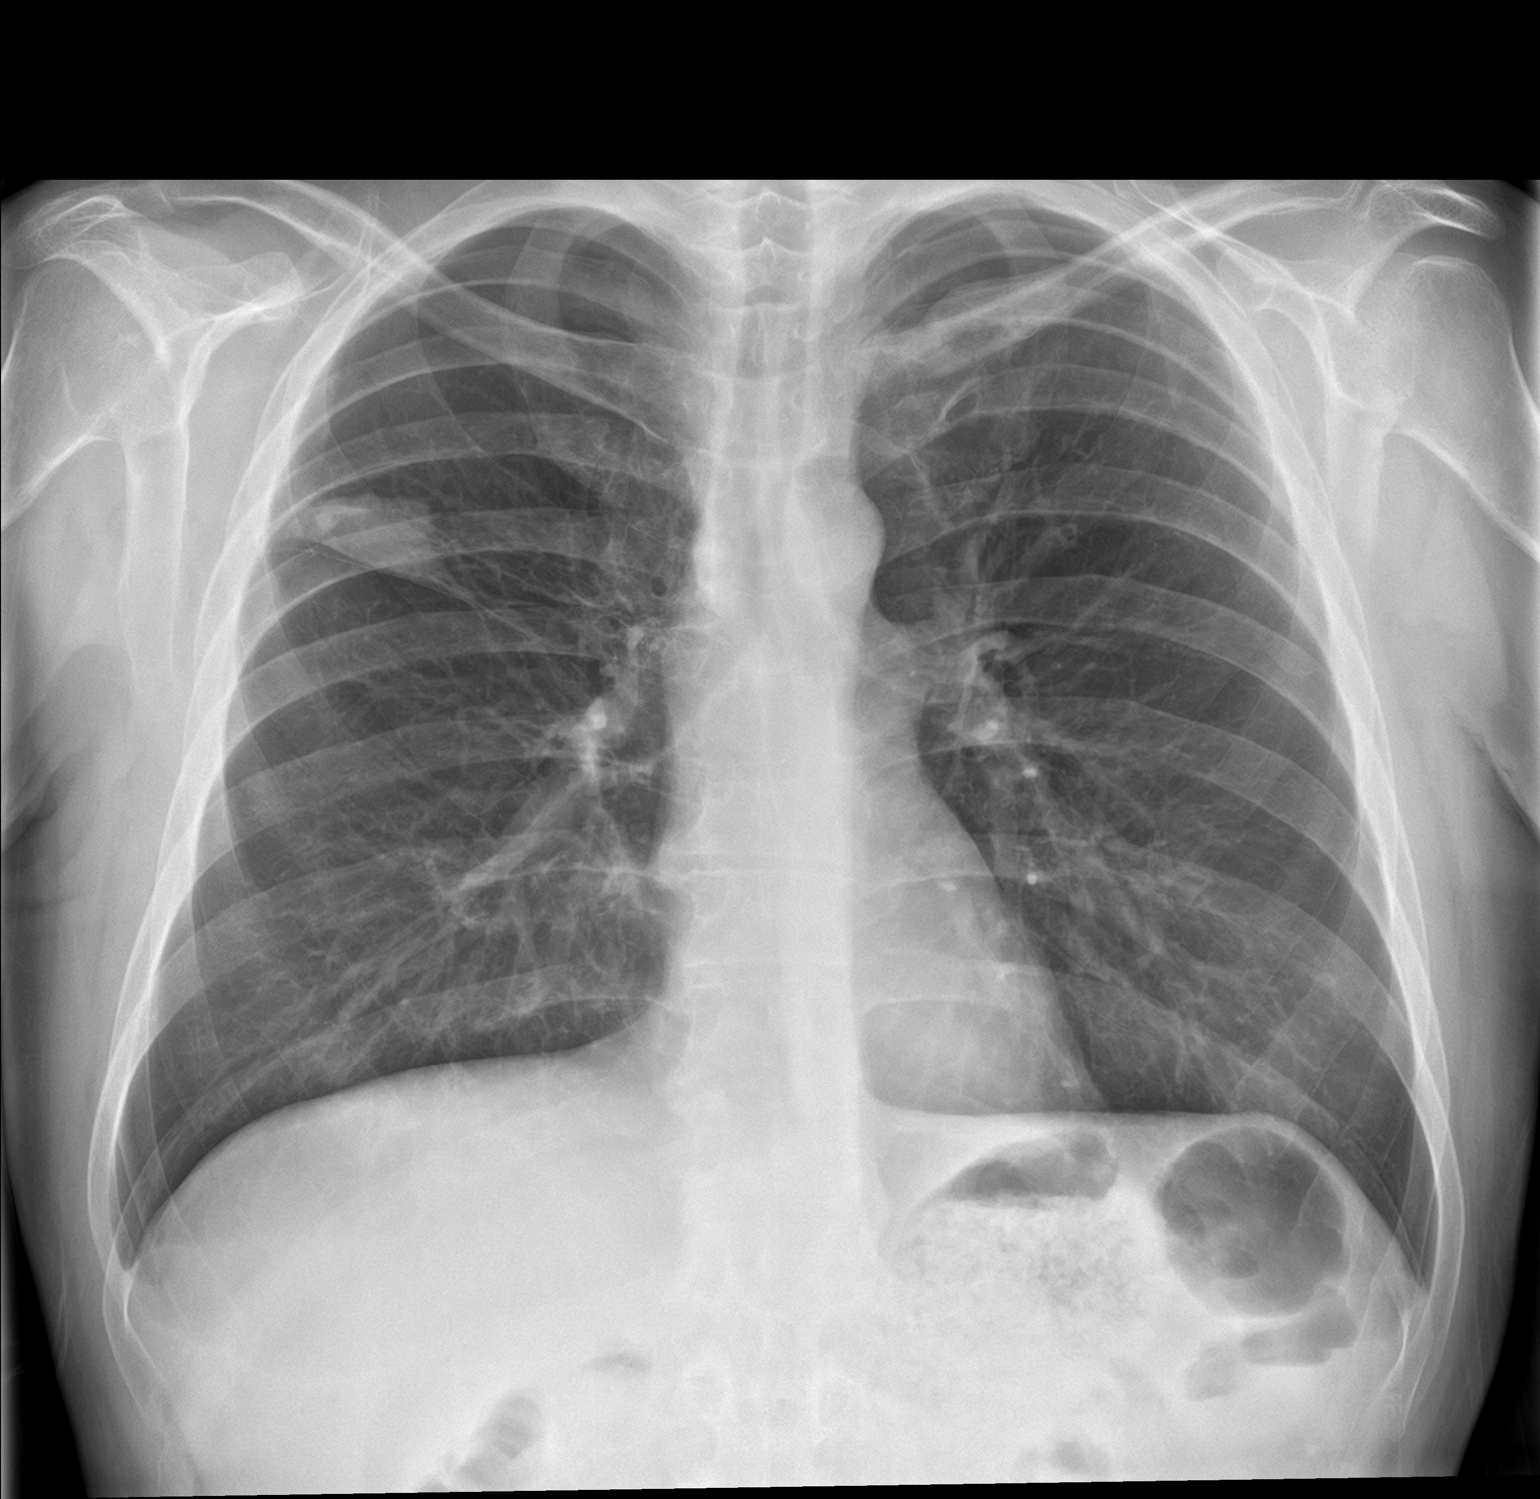

[chest lat]
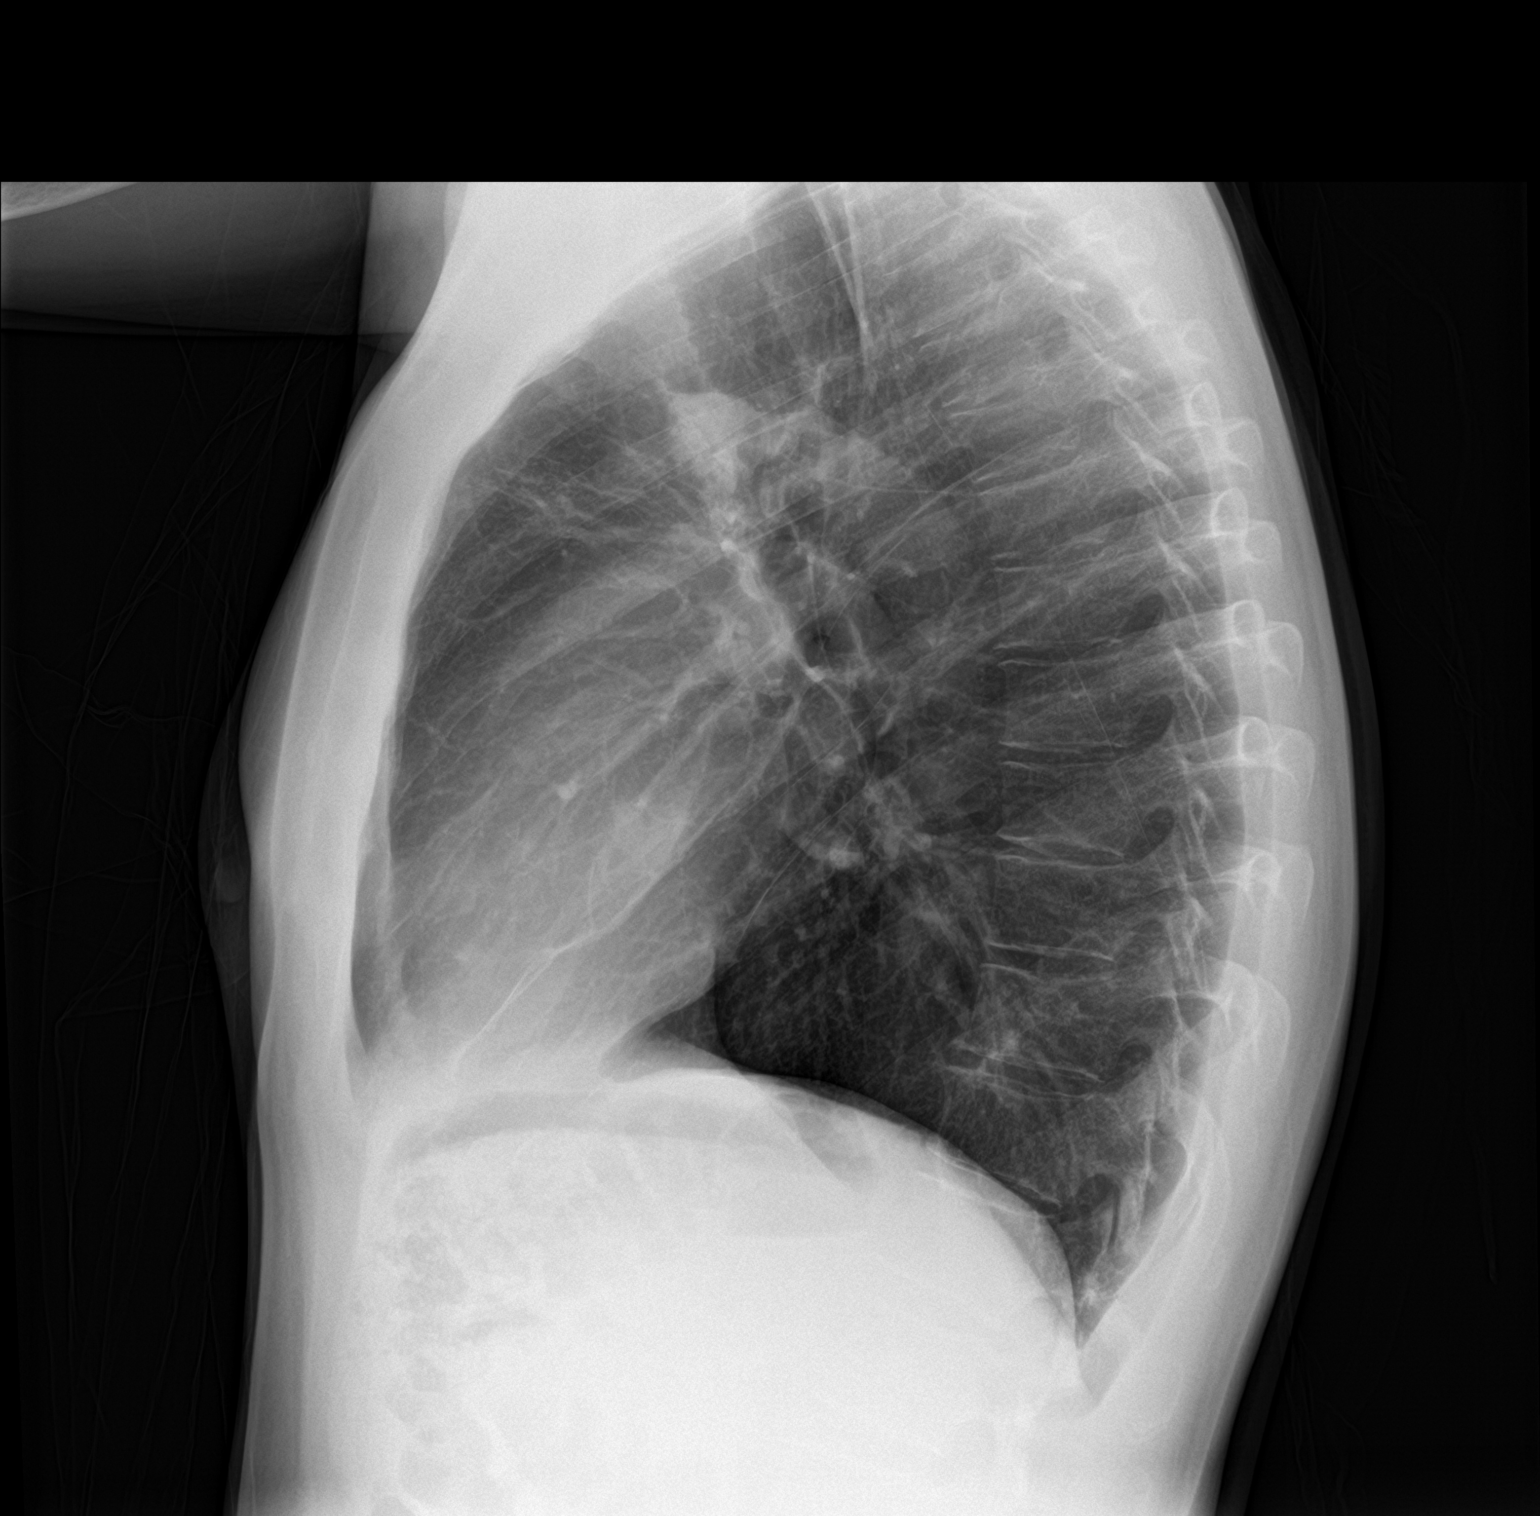

[2 of 2 positions shown; findings below may reference images not displayed]

FINDINGS: Right upper lobe ovoid nodular opacity as before. No new ac airspace
disease or effusion. Stable cardiomediastinal silhouette. No
pneumothorax.
IMPRESSION: No active cardiopulmonary disease. Stable right upper lobe nodular
opacity.

## 2019-03-30 MED ORDER — ONDANSETRON HCL 4 MG/2ML IJ SOLN: 4.0000 mg | Freq: Four times a day (QID) | INTRAMUSCULAR | Status: DC | PRN

## 2019-03-30 MED ORDER — ONDANSETRON HCL 4 MG PO TABS: 4.0000 mg | ORAL_TABLET | Freq: Four times a day (QID) | ORAL | Status: DC | PRN

## 2019-03-30 MED ORDER — SODIUM CHLORIDE 0.9 % IV SOLN: 250.0000 mL | INTRAVENOUS | Status: DC | PRN

## 2019-03-30 MED ORDER — POLYETHYLENE GLYCOL 3350 17 G PO PACK: 17.0000 g | PACK | Freq: Every day | ORAL | Status: DC | PRN

## 2019-03-30 MED ORDER — SODIUM CHLORIDE 0.9% FLUSH: 3.0000 mL | INTRAVENOUS | Status: DC | PRN

## 2019-03-30 MED ORDER — PANTOPRAZOLE SODIUM 40 MG PO TBEC
40.0000 mg | DELAYED_RELEASE_TABLET | Freq: Every day | ORAL | Status: DC
  Administered 2019-03-31 – 2019-04-01 (×2): 40 mg via ORAL
  Filled 2019-03-30 (×2): qty 1

## 2019-03-30 MED ORDER — INSULIN ASPART PROT & ASPART (70-30 MIX) 100 UNIT/ML ~~LOC~~ SUSP: 30.0000 [IU] | Freq: Two times a day (BID) | SUBCUTANEOUS | Status: DC

## 2019-03-30 MED ORDER — SODIUM CHLORIDE 0.9% FLUSH
3.0000 mL | Freq: Two times a day (BID) | INTRAVENOUS | Status: DC
  Administered 2019-03-31 – 2019-04-01 (×3): 3 mL via INTRAVENOUS

## 2019-03-30 NOTE — ED Notes (Signed)
Pt given coke 

## 2019-03-30 NOTE — ED Notes (Signed)
Pt back in bed resting comfortably at this time

## 2019-03-30 NOTE — ED Notes (Signed)
Pt up to bathroom.

## 2019-03-30 NOTE — ED Triage Notes (Signed)
Pt arrives via ems from being found unresponsive underneath a tree in the yard, pt's fsbs was 30. Pt was given 250 ml of D10 by ems and an additional 100 ml. Ems reports pt awakened on the way to the ER and has been alert since. Pt's recheck fsbs by ems was 290.

## 2019-03-30 NOTE — ED Provider Notes (Signed)
[facility] Emergency Department Provider Note ____________________________________________   First MD Initiated Contact with Patient 03/30/19 2224     (approximate)  I have reviewed the triage vital signs and the nursing notes.  HISTORY  Chief Complaint Hypoglycemia  Offered patient Spanish interpreter, he declined this.  Does speak English quite fluently, able to describe his situation well.  HPI Gregory Moody is a 43 y.o. male who was found unresponsive in his yard today  Patient reports he went outside earlier today, is not quite sure how long he was down for but he felt tired and had to lay down and then woke up.  EMS had to administer glucose for him, he is a diabetic.  Reports he was out cutting bushes around his home.  He started feeling tired, laid down and then woke up not quite sure how long he was on the ground for but he thinks it was maybe a couple of hours  Denies fevers or chills.  No recent illness or infection.  Does have loose stools, but reports that is not new for him.  No exposure to anyone known to have coronavirus.  No chest pain no trouble breathing no headaches.  Reports he eats 3 meals daily, had breakfast today, also had lunch.  Has not changed his insulin regimen   Past Medical History:  Diagnosis Date  . Diabetes mellitus without complication ([place])   . Gastroparesis   . Tuberculosis     Patient Active Problem List   Diagnosis Date Noted  . Acute renal failure (ARF) ([place]) 03/06/2019  . CAP (community acquired pneumonia) 02/14/2019  . AKI (acute kidney injury) ([place]) 02/02/2019  . ARF (acute renal failure) ([place]) 01/03/2019  . Malnutrition of moderate degree 02/24/2018  . GERD (gastroesophageal reflux disease) 02/23/2018  . Diabetic gastroparesis ([place]) 02/23/2018  . Diabetic foot ulcer ([place]) 02/23/2018  . Aspiration pneumonia ([place]) 04/21/2017  . Hypoglycemia 04/21/2017  . Diabetes mellitus with hyperglycemia ([place])  04/21/2017  . Hip fracture, unspecified laterality, closed, initial encounter ([place]) 04/17/2017  . Hyperglycemia 04/17/2017  . Protein-calorie malnutrition, severe 04/09/2016  . Cavitary lesion of lung 04/06/2016    Past Surgical History:  Procedure Laterality Date  . ESOPHAGOGASTRODUODENOSCOPY N/A 02/03/2019   Procedure: ESOPHAGOGASTRODUODENOSCOPY (EGD);  Surgeon: [place]edo, Benay Pike, MD;  Location: [facility] ENDOSCOPY;  Service: Gastroenterology;  Laterality: N/A;  . NO PAST SURGERIES      Prior to Admission medications   Medication Sig Start Date End Date Taking? Authorizing Provider  FLUoxetine (PROZAC) 20 MG capsule Take 20 mg by mouth daily.    [provider]  HYDROcodone-acetaminophen (NORCO/VICODIN) 5-325 MG tablet Take 1 tablet by mouth every 4 (four) hours as needed. 03/11/19   Harvest Dark, MD  insulin NPH-regular Human (70-30) 100 UNIT/ML injection Inject 30 Units into the skin 2 (two) times daily with a meal. 25 units with breakfast in am and 35 units with supper 03/07/19   Bettey Costa, MD  ondansetron (ZOFRAN ODT) 4 MG disintegrating tablet Take 1 tablet (4 mg total) by mouth every 8 (eight) hours as needed for nausea or vomiting. 03/11/19   Harvest Dark, MD  pantoprazole (PROTONIX) 40 MG tablet Take 1 tablet (40 mg total) by mouth daily. 03/07/19   Bettey Costa, MD    Allergies Patient has no known allergies.  Family History  Problem Relation Age of Onset  . Diabetes Mother   . Diabetes Father     Social History Social History   Tobacco Use  .  Smoking status: Never Smoker  . Smokeless tobacco: Never Used  Substance Use Topics  . Alcohol use: No  . Drug use: No    Review of Systems Constitutional: No fever/chills Eyes: No visual changes. ENT: No sore throat. Cardiovascular: Denies chest pain. Respiratory: Denies shortness of breath. Gastrointestinal: No abdominal pain.  Frequent loose stools, reports is fairly chronic. Genitourinary: Negative  for dysuria. Musculoskeletal: Negative for back pain. Skin: Negative for rash. Neurological: Negative for headaches, areas of focal weakness or numbness.    ____________________________________________   PHYSICAL EXAM:  VITAL SIGNS: ED Triage Vitals  Enc Vitals Group     BP 03/30/19 1900 (!) 86/63     Pulse Rate 03/30/19 1900 67     Resp 03/30/19 1908 16     Temp 03/30/19 1908 (!) 95 F (35 C)     Temp Source 03/30/19 1908 Oral     SpO2 03/30/19 1900 100 %     Weight --      Height --      Head Circumference --      Peak Flow --      Pain Score 03/30/19 1909 0     Pain Loc --      Pain Edu? --      Excl. in [place]? --     Constitutional: Alert and oriented. Well appearing and in no acute distress.  Pleasant.  Rather slender build almost slightly cachectic in appearance. Eyes: Conjunctivae are normal. Head: Atraumatic. Nose: No congestion/rhinnorhea. Mouth/Throat: Mucous membranes are moist. Neck: No stridor.  Cardiovascular: Normal rate, regular rhythm. Grossly normal heart sounds.  Good peripheral circulation. Respiratory: Normal respiratory effort.  No retractions. Lungs CTAB. Gastrointestinal: Soft and nontender. No distention. Musculoskeletal: No lower extremity tenderness nor edema. Neurologic:  Normal speech and language. No gross focal neurologic deficits are appreciated.  Skin:  Skin is warm, dry and intact. No rash noted. Psychiatric: Mood and affect are normal. Speech and behavior are normal.  ____________________________________________   LABS (all labs ordered are listed, but only abnormal results are displayed)  Labs Reviewed  CBC - Abnormal; Notable for the following components:      Result Value   RBC 2.76 (*)    Hemoglobin 8.4 (*)    HCT 25.5 (*)    All other components within normal limits  BASIC METABOLIC PANEL - Abnormal; Notable for the following components:   CO2 16 (*)    Glucose, Bld 113 (*)    BUN 32 (*)    Creatinine, Ser 2.43 (*)     Calcium 8.0 (*)    GFR calc non Af Amer 31 (*)    GFR calc Af Amer 36 (*)    All other components within normal limits  GLUCOSE, CAPILLARY - Abnormal; Notable for the following components:   Glucose-Capillary 125 (*)    All other components within normal limits  CULTURE, BLOOD (ROUTINE X 2)  CULTURE, BLOOD (ROUTINE X 2)  NOVEL CORONAVIRUS, NAA (HOSPITAL ORDER, SEND-OUT TO REF LAB)  GLUCOSE, CAPILLARY  CORTISOL  TSH  CK  URINALYSIS, COMPLETE (UACMP) WITH MICROSCOPIC  CBG MONITORING, ED  CBG MONITORING, ED  CBG MONITORING, ED  CBG MONITORING, ED  CBG MONITORING, ED  CBG MONITORING, ED   ____________________________________________  ____________________________________________  RADIOLOGY  Dg Chest 2 View  Result Date: 03/30/2019 CLINICAL DATA:  Hypothermia EXAM: CHEST - 2 VIEW COMPARISON:  03/17/2019, 06/20/2018, CT 02/13/2019 FINDINGS: Right upper lobe ovoid nodular opacity as before. No new ac airspace disease  or effusion. Stable cardiomediastinal silhouette. No pneumothorax. IMPRESSION: No active cardiopulmonary disease. Stable right upper lobe nodular opacity. Electronically Signed   By: Donavan Foil M.D.   On: 03/30/2019 22:53    Chest X reviewed negative for acute.  Stable upper lobe nodular opacity ____________________________________________   PROCEDURES  Procedure(s) performed: None  Procedures  Critical Care performed: No  ____________________________________________   INITIAL IMPRESSION / ASSESSMENT AND PLAN / ED COURSE  Pertinent labs & imaging results that were available during my care of the patient were reviewed by me and considered in my medical decision making (see chart for details).   Patient returns for hypoglycemia, improved by arrival of the ER.  Alert and oriented.  Denies acute symptoms except for loose stool which apparently is somewhat chronic as well.  No signs or symptoms suggest acute coronavirus infection.  Denies fevers chills cough  shortness of breath body aches or exposure. He is slightly hypothermic, and etiology of this is somewhat unclear.  Clinical Course as of Mar 29 2300  Fri Mar 30, 2019  2025 Patient currently using restroom.  I went to go see him and evaluate him, some delay due to being pulled away to a critical case.   [MQ]  2222 Patient resting, ongoing hypothermia.  Core temp 94.7.  Will apply Retail banker.  Anticipate need for admission.  Broadening work-up to include both cultures, urinalysis TSH cortisol.  He is fully awake alert and oriented without any neurologic symptoms.  He has been here in our ER for some time his core temp remains low, does not appear to be acutely environmental though he was down for some amount of time this afternoon but it was not cold outside either.  There is further work-up.  No obvious signs of infection several test pending at this time.  Patient did not wish for a Spanish interpreter, I offered it to him, he is understanding agreeable with plan for admission   [MQ]    Clinical Course User Index [MQ] Delman Kitten, MD   ----------------------------------------- 11:44 PM on 03/30/2019 -----------------------------------------  Patient admitted to medical service, further work-up pending.  Ongoing ED care assigned to Dr. Alfred Levins  ____________________________________________   FINAL CLINICAL IMPRESSION(S) / ED DIAGNOSES  Final diagnoses:  Hypothermia, initial encounter  Hypoglycemia, insulin-dependent diabetic      Note:  This document was prepared using Dragon voice recognition software and may include unintentional dictation errors       Delman Kitten, MD 04/01/19 0014

## 2019-03-30 NOTE — ED Notes (Signed)
Pt taken to room to take rectal temp. Rectal temp 94.6. Pt had diarrhea and EDT Zach and myself assisting pt. Pt began to have diarrhea on the floor. EDT Zach placed pt on bedside commode. Pt was on commode actively pooping for about 25 minutes. Pt cleaned up and assisted into wheelchair. Pt stated he has had diarrhea like this for 2 years. Pt taken to bed to apply bear hugger, Xray staff at bedside waiting on pt for test. Pt taken to Xray. Pt returned from Xray and needed to go to bathroom again. Pt taken to bathroom by EDT Zach. MD Quale informed and further bloodwork will be collected as soon as possible. Pt is alert and oriented. No breathing difficulties noted at this time.

## 2019-03-30 NOTE — ED Notes (Signed)
PT given meal tray and drink at this time. Pt given warm blankets

## 2019-03-30 NOTE — ED Notes (Signed)
Pt given crackers and peanut butter.

## 2019-03-31 ENCOUNTER — Inpatient Hospital Stay: Payer: Self-pay

## 2019-03-31 ENCOUNTER — Encounter: Payer: Self-pay | Admitting: *Deleted

## 2019-03-31 DIAGNOSIS — L8990 Pressure ulcer of unspecified site, unspecified stage: Secondary | ICD-10-CM | POA: Insufficient documentation

## 2019-03-31 LAB — GASTROINTESTINAL PANEL BY PCR, STOOL (REPLACES STOOL CULTURE)

## 2019-03-31 LAB — BASIC METABOLIC PANEL
Anion gap: 6 (ref 5–15)
BUN: 37 mg/dL — ABNORMAL HIGH (ref 6–20)
CO2: 16 mmol/L — ABNORMAL LOW (ref 22–32)
Calcium: 8.5 mg/dL — ABNORMAL LOW (ref 8.9–10.3)
Chloride: 119 mmol/L — ABNORMAL HIGH (ref 98–111)
Creatinine, Ser: 2.32 mg/dL — ABNORMAL HIGH (ref 0.61–1.24)
GFR calc Af Amer: 38 mL/min — ABNORMAL LOW (ref >60)
GFR calc non Af Amer: 33 mL/min — ABNORMAL LOW (ref >60)
Glucose, Bld: 110 mg/dL — ABNORMAL HIGH (ref 70–99)
Potassium: 4.7 mmol/L (ref 3.5–5.1)
Sodium: 141 mmol/L (ref 135–145)

## 2019-03-31 LAB — GLUCOSE, CAPILLARY
Glucose-Capillary: 100 mg/dL — ABNORMAL HIGH (ref 70–99)
Glucose-Capillary: 128 mg/dL — ABNORMAL HIGH (ref 70–99)
Glucose-Capillary: 181 mg/dL — ABNORMAL HIGH (ref 70–99)
Glucose-Capillary: 187 mg/dL — ABNORMAL HIGH (ref 70–99)
Glucose-Capillary: 226 mg/dL — ABNORMAL HIGH (ref 70–99)
Glucose-Capillary: 229 mg/dL — ABNORMAL HIGH (ref 70–99)
Glucose-Capillary: 246 mg/dL — ABNORMAL HIGH (ref 70–99)
Glucose-Capillary: 52 mg/dL — ABNORMAL LOW (ref 70–99)
Glucose-Capillary: 59 mg/dL — ABNORMAL LOW (ref 70–99)
Glucose-Capillary: 63 mg/dL — ABNORMAL LOW (ref 70–99)

## 2019-03-31 LAB — URINALYSIS, COMPLETE (UACMP) WITH MICROSCOPIC
Bilirubin Urine: NEGATIVE
Glucose, UA: 150 mg/dL — AB
Ketones, ur: NEGATIVE mg/dL
Nitrite: NEGATIVE
Protein, ur: NEGATIVE mg/dL
Specific Gravity, Urine: 1.011 (ref 1.005–1.030)
pH: 5 (ref 5.0–8.0)

## 2019-03-31 LAB — IRON AND TIBC
Iron: 72 ug/dL (ref 45–182)
Saturation Ratios: 29 % (ref 17.9–39.5)
TIBC: 250 ug/dL (ref 250–450)
UIBC: 179 ug/dL

## 2019-03-31 LAB — C DIFFICILE QUICK SCREEN W PCR REFLEX
C Diff antigen: POSITIVE — AB
C Diff toxin: NEGATIVE

## 2019-03-31 LAB — HEPATIC FUNCTION PANEL
ALT: 73 U/L — ABNORMAL HIGH (ref 0–44)
AST: 47 U/L — ABNORMAL HIGH (ref 15–41)
Albumin: 3.5 g/dL (ref 3.5–5.0)
Alkaline Phosphatase: 111 U/L (ref 38–126)
Bilirubin, Direct: <0.1 mg/dL (ref 0.0–0.2)
Total Bilirubin: 0.2 mg/dL — ABNORMAL LOW (ref 0.3–1.2)
Total Protein: 7.1 g/dL (ref 6.5–8.1)

## 2019-03-31 LAB — TSH: TSH: 0.573 u[IU]/mL (ref 0.350–4.500)

## 2019-03-31 LAB — CBC
HCT: 25 % — ABNORMAL LOW (ref 39.0–52.0)
Hemoglobin: 8.1 g/dL — ABNORMAL LOW (ref 13.0–17.0)
MCH: 30.3 pg (ref 26.0–34.0)
MCHC: 32.4 g/dL (ref 30.0–36.0)
MCV: 93.6 fL (ref 80.0–100.0)
Platelets: 251 10*3/uL (ref 150–400)
RBC: 2.67 MIL/uL — ABNORMAL LOW (ref 4.22–5.81)
RDW: 14 % (ref 11.5–15.5)
WBC: 5.3 10*3/uL (ref 4.0–10.5)
nRBC: 0 % (ref 0.0–0.2)

## 2019-03-31 LAB — CK: Total CK: 365 U/L (ref 49–397)

## 2019-03-31 LAB — OCCULT BLOOD X 1 CARD TO LAB, STOOL: Fecal Occult Bld: NEGATIVE

## 2019-03-31 LAB — CLOSTRIDIUM DIFFICILE BY PCR, REFLEXED: Toxigenic C. Difficile by PCR: POSITIVE — AB

## 2019-03-31 LAB — CORTISOL: Cortisol, Plasma: 8.3 ug/dL

## 2019-03-31 LAB — BRAIN NATRIURETIC PEPTIDE: B Natriuretic Peptide: 55 pg/mL (ref 0.0–100.0)

## 2019-03-31 IMAGING — CR LEFT KNEE - COMPLETE 4+ VIEW
4 series · 4 of 4 positions shown · non-contrast
Comparison: Radiographs 2 weeks ago [DATE]

CLINICAL DATA: Abscess. Open wound anterior left knee.

EXAM:
LEFT KNEE - COMPLETE 4+ VIEW

[knee ap]
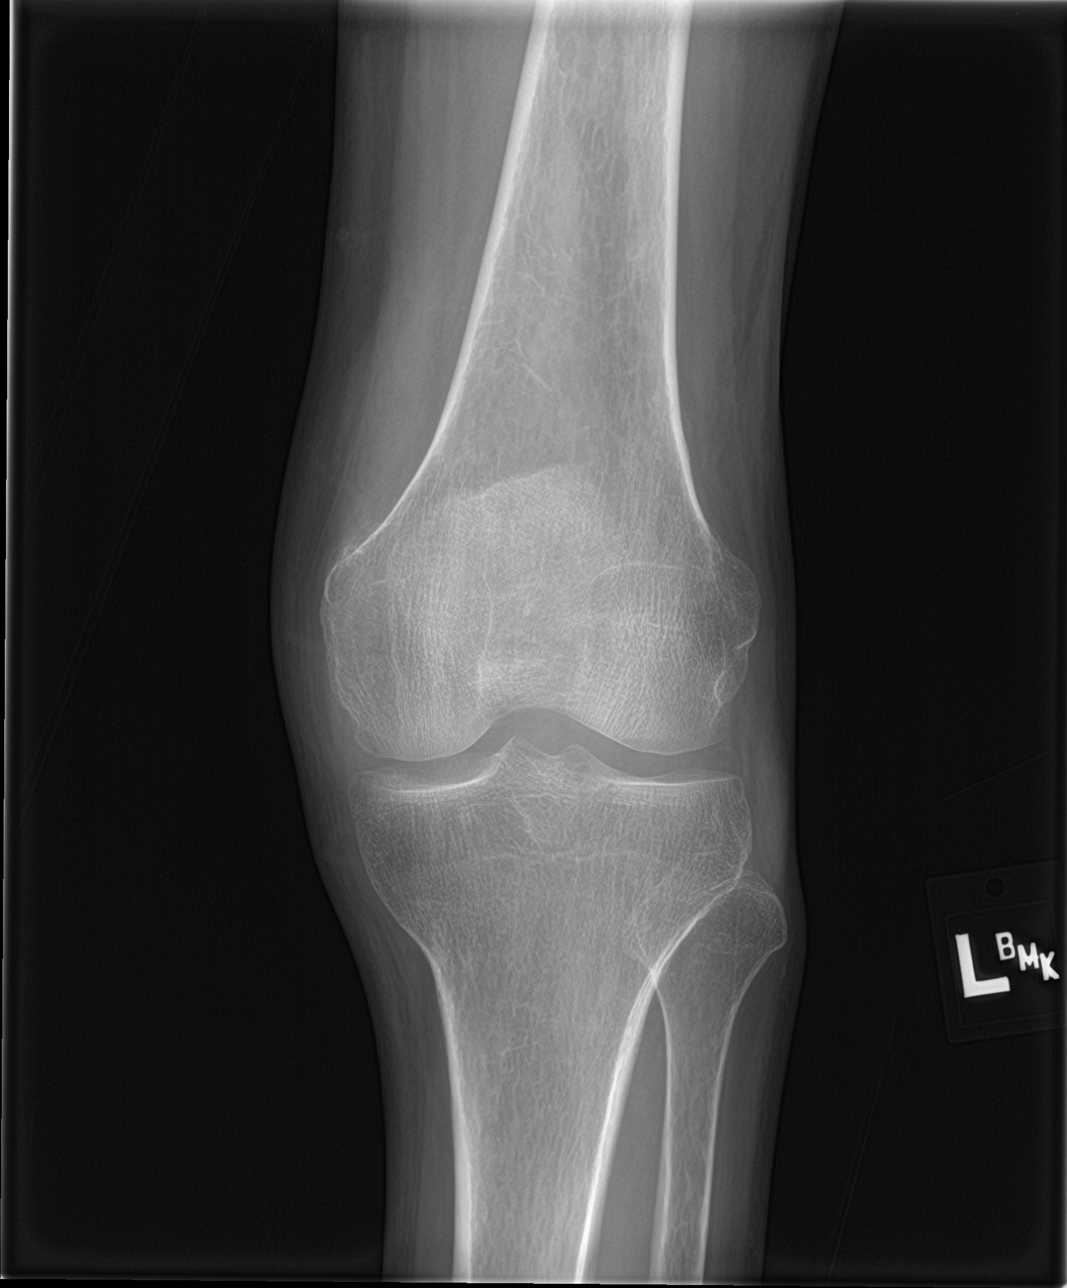

[knee obl (1 of 2)]
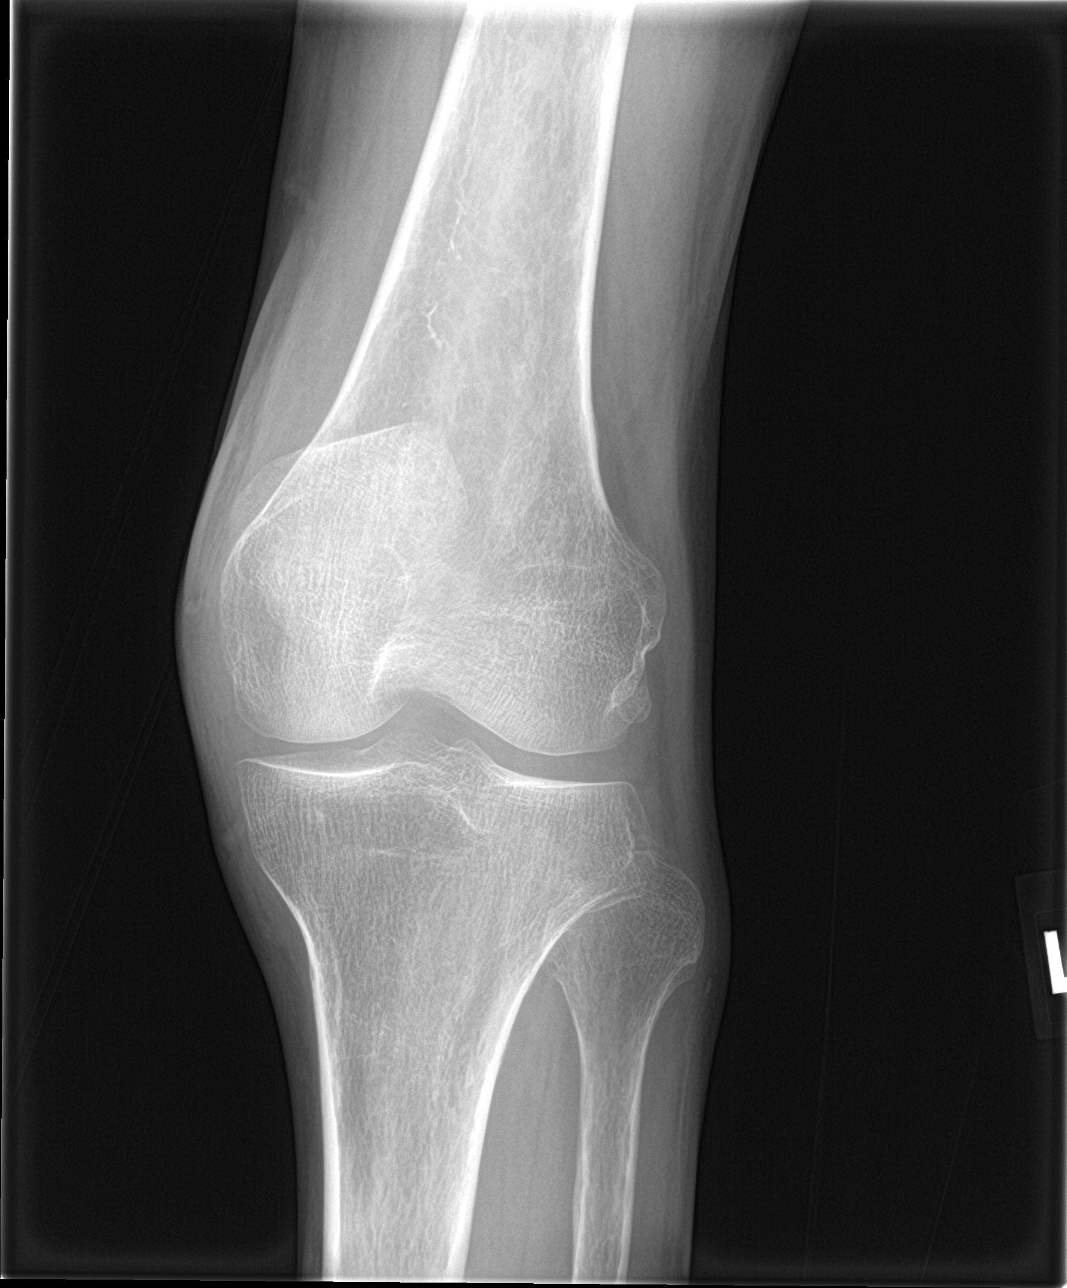

[knee obl (2 of 2)]
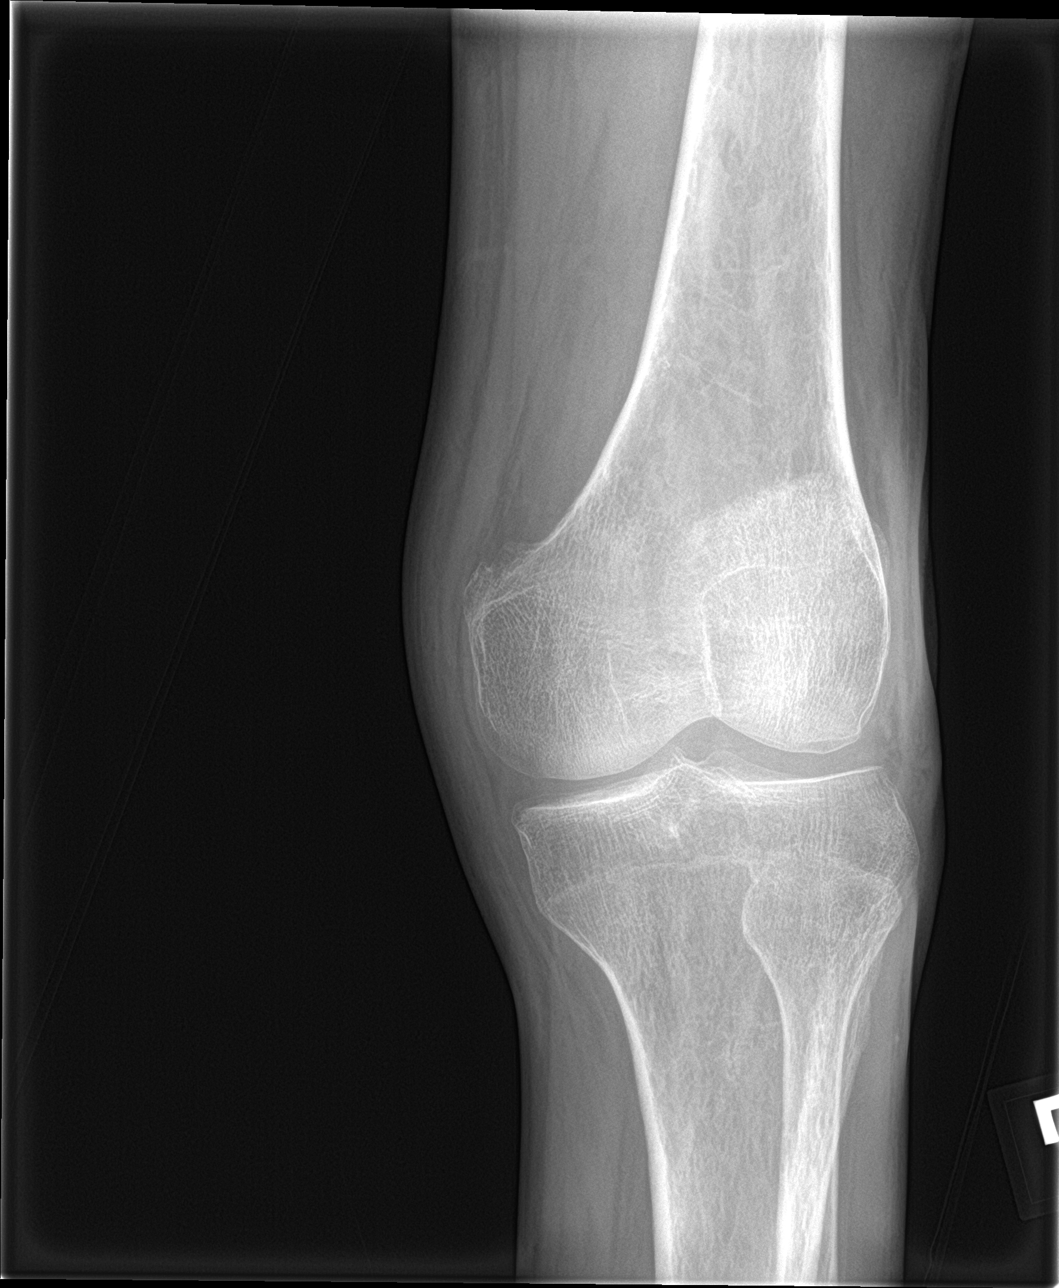

[knee lat]
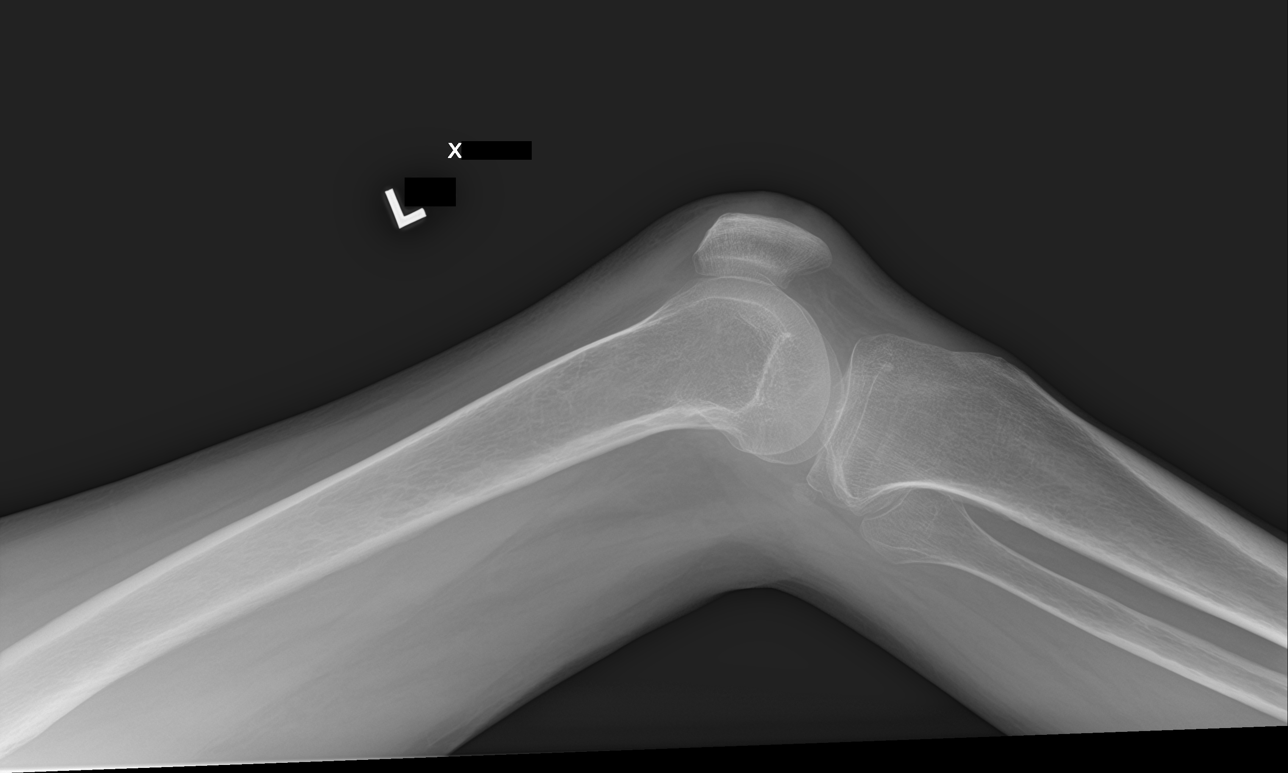

[4 of 4 positions shown; findings below may reference images not displayed]

FINDINGS: No evidence of fracture, dislocation, or joint effusion. No bony
destructive change or periosteal reaction. Soft tissue edema
anteriorly with skin irregularity overlying the patella. No tracking
soft tissue air. No radiopaque foreign body.
IMPRESSION: 1. Soft tissue edema anterior to the patella. No radiopaque foreign
body or osseous abnormality.
2. No radiographic evidence of osteomyelitis.

## 2019-03-31 MED ORDER — DEXTROSE-NACL 5-0.9 % IV SOLN: INTRAVENOUS | Status: DC

## 2019-03-31 MED ORDER — INSULIN ASPART 100 UNIT/ML ~~LOC~~ SOLN
0.0000 [IU] | Freq: Three times a day (TID) | SUBCUTANEOUS | Status: DC
  Administered 2019-03-31: 2 [IU] via SUBCUTANEOUS
  Administered 2019-04-01: 5 [IU] via SUBCUTANEOUS
  Administered 2019-04-01: 09:00:00 1 [IU] via SUBCUTANEOUS
  Filled 2019-03-31 (×3): qty 1

## 2019-03-31 MED ORDER — INSULIN ASPART 100 UNIT/ML ~~LOC~~ SOLN: 0.0000 [IU] | Freq: Every day | SUBCUTANEOUS | Status: DC

## 2019-03-31 MED ORDER — SODIUM CHLORIDE 0.9 % IV SOLN: INTRAVENOUS | Status: DC

## 2019-03-31 MED ORDER — VANCOMYCIN 50 MG/ML ORAL SOLUTION
125.0000 mg | Freq: Four times a day (QID) | ORAL | Status: DC
  Administered 2019-03-31 – 2019-04-01 (×3): 125 mg via ORAL
  Filled 2019-03-31 (×7): qty 2.5

## 2019-03-31 MED ORDER — SODIUM CHLORIDE 0.9 % IV SOLN
2.0000 g | INTRAVENOUS | Status: DC
  Administered 2019-03-31: 05:00:00 2 g via INTRAVENOUS
  Filled 2019-03-31: qty 2
  Filled 2019-03-31: qty 20

## 2019-03-31 MED ORDER — INSULIN ASPART PROT & ASPART (70-30 MIX) 100 UNIT/ML ~~LOC~~ SUSP
20.0000 [IU] | Freq: Two times a day (BID) | SUBCUTANEOUS | Status: DC
  Administered 2019-03-31: 02:00:00 20 [IU] via SUBCUTANEOUS
  Filled 2019-03-31: qty 10

## 2019-03-31 MED ORDER — INSULIN ASPART PROT & ASPART (70-30 MIX) 100 UNIT/ML ~~LOC~~ SUSP: 20.0000 [IU] | Freq: Two times a day (BID) | SUBCUTANEOUS | Status: DC

## 2019-03-31 NOTE — H&P (Addendum)
[place] [facility] [place] NAME: Gregory Moody    MR#:  [id number]  DATE OF BIRTH:  27-Jul-1976  DATE OF ADMISSION:  03/30/2019  PRIMARY CARE PHYSICIAN: [place]   REQUESTING/REFERRING PHYSICIAN: Delman Kitten, MD  CHIEF COMPLAINT:   Chief Complaint  Patient presents with  . Hypoglycemia    HISTORY OF PRESENT ILLNESS:  Gregory Moody  is a 43 y.o. male with a known history of diabetes mellitus, tuberculosis status post treatment, and gastroparesis.  He presented to the emergency room with hypoglycemia.  Patient reportedly had gone outside earlier in the day and lay down under the bushes.  He had been trimming bushes earlier in the day around his home.  He began feeling tired and laid down.  He was awakened by EMS services.  He feels that he was on the ground about 2 hours.  EMS reported glucose of 30 which was treated with D50.  Glucose on arrival to the emergency room was 113.  However patient was found to be hypothermic with temperature 94.7.  He also had 1 loose bowel movement in the emergency room.  Patient continued to be slightly lethargic however would not awaken to conversation.  Warming blanket was placed on the patient with his temperature increasing to 97.8.  He denies recent illness.  He denies fevers or chills.  He denies abdominal pain.  He denies chest pain, shortness of breath.  He denies nausea or vomiting.  Patient denies recent diarrhea however he was noted to have 1 loose bowel movement while in the emergency room.  No hematemesis, hematochezia, or melena according to the patient.  On my examination of the patient there is a 7 cm ulceration to his left knee which he reports has been there for several months.  He denies having had prior evaluation or treatment.  X-ray demonstrates surrounding edema however no evidence of osteomyelitis.  On arrival to the emergency room, creatinine is 2.43 with BUN 32.   Hemoglobin is 8.4 with hematocrit 25.5.  WBC is 5.5.  Glucose is 113.  Lactic acid is 1.7.  We have admitted him to the hospitalist service for further management.  PAST MEDICAL HISTORY:   Past Medical History:  Diagnosis Date  . Diabetes mellitus without complication ([place])   . Gastroparesis   . Tuberculosis     PAST SURGICAL HISTORY:   Past Surgical History:  Procedure Laterality Date  . ESOPHAGOGASTRODUODENOSCOPY N/A 02/03/2019   Procedure: ESOPHAGOGASTRODUODENOSCOPY (EGD);  Surgeon: Toledo, Benay Pike, MD;  Location: [facility] ENDOSCOPY;  Service: Gastroenterology;  Laterality: N/A;  . NO PAST SURGERIES      SOCIAL HISTORY:   Social History   Tobacco Use  . Smoking status: Never Smoker  . Smokeless tobacco: Never Used  Substance Use Topics  . Alcohol use: No    FAMILY HISTORY:   Family History  Problem Relation Age of Onset  . Diabetes Mother   . Diabetes Father     DRUG ALLERGIES:  No Known Allergies  REVIEW OF SYSTEMS:   Review of Systems  Constitutional: Negative for chills and fever.  HENT: Negative for congestion, sinus pain and sore throat.   Eyes: Negative for blurred vision, double vision and pain.  Respiratory: Negative for cough, shortness of breath and wheezing.   Cardiovascular: Negative for chest pain, orthopnea and leg swelling.  Gastrointestinal: Negative for abdominal pain, blood in stool, constipation, diarrhea, heartburn, melena, nausea and vomiting.  Genitourinary: Negative for  dysuria, flank pain, frequency, hematuria and urgency.  Musculoskeletal: Negative for falls, myalgias and neck pain.  Neurological: Negative for dizziness, focal weakness, loss of consciousness, weakness and headaches.  Psychiatric/Behavioral: Negative.  Negative for depression.      MEDICATIONS AT HOME:   Prior to Admission medications   Medication Sig Start Date End Date Taking? Authorizing Provider  aspirin EC 81 MG tablet Take 81 mg by mouth daily.   Yes  [provider]  FLUoxetine (PROZAC) 20 MG capsule Take 20 mg by mouth daily.   Yes [provider]  insulin NPH-regular Human (70-30) 100 UNIT/ML injection Inject 30 Units into the skin 2 (two) times daily with a meal. 25 units with breakfast in am and 35 units with supper 03/07/19  Yes Mody, Sital, MD  pantoprazole (PROTONIX) 40 MG tablet Take 1 tablet (40 mg total) by mouth daily. 03/07/19  Yes Bettey Costa, MD  HYDROcodone-acetaminophen (NORCO/VICODIN) 5-325 MG tablet Take 1 tablet by mouth every 4 (four) hours as needed. Patient not taking: Reported on 03/31/2019 03/11/19   Harvest Dark, MD  ondansetron (ZOFRAN ODT) 4 MG disintegrating tablet Take 1 tablet (4 mg total) by mouth every 8 (eight) hours as needed for nausea or vomiting. Patient not taking: Reported on 03/31/2019 03/11/19   Harvest Dark, MD      VITAL SIGNS:  Blood pressure 104/73, pulse 72, temperature 97.8 F (36.6 C), temperature source Oral, resp. rate 18, SpO2 100 %.  PHYSICAL EXAMINATION:  Physical Exam Vitals signs and nursing note reviewed.  Constitutional:      General: He is not in acute distress.    Appearance: He is not ill-appearing.  HENT:     Head: Normocephalic.     Right Ear: External ear normal.     Left Ear: External ear normal.     Nose: Nose normal.     Mouth/Throat:     Mouth: Mucous membranes are moist.     Pharynx: Oropharynx is clear.  Eyes:     General: No scleral icterus.    Extraocular Movements: Extraocular movements intact.     Conjunctiva/sclera: Conjunctivae normal.     Pupils: Pupils are equal, round, and reactive to light.  Neck:     Musculoskeletal: Normal range of motion and neck supple. No muscular tenderness.  Cardiovascular:     Rate and Rhythm: Normal rate and regular rhythm.     Pulses: Normal pulses.     Heart sounds: Normal heart sounds. No murmur. No friction rub. No gallop.   Pulmonary:     Effort: Pulmonary effort is normal. No respiratory  distress.     Breath sounds: Normal breath sounds. No wheezing, rhonchi or rales.  Abdominal:     General: Abdomen is flat. There is no distension.     Palpations: Abdomen is soft.     Tenderness: There is no abdominal tenderness. There is no guarding or rebound.  Musculoskeletal: Normal range of motion.        General: No swelling or tenderness.     Right lower leg: No edema.     Left lower leg: No edema.  Skin:    General: Skin is warm and dry.     Capillary Refill: Capillary refill takes less than 2 seconds.     Coloration: Skin is not jaundiced.     Findings: Lesion (Left knee ulceration) present. No rash.  Neurological:     Mental Status: He is alert and oriented to person, place, and time.  Cranial Nerves: No cranial nerve deficit.     Sensory: No sensory deficit.     Motor: No weakness.  Psychiatric:        Mood and Affect: Mood normal.        Behavior: Behavior normal.       LABORATORY PANEL:   CBC Recent Labs  Lab 03/30/19 1914  WBC 5.5  HGB 8.4*  HCT 25.5*  PLT 272   ------------------------------------------------------------------------------------------------------------------  Chemistries  Recent Labs  Lab 03/30/19 1914  NA 136  K 3.7  CL 110  CO2 16*  GLUCOSE 113*  BUN 32*  CREATININE 2.43*  CALCIUM 8.0*  AST 47*  ALT 73*  ALKPHOS 111  BILITOT 0.2*   ------------------------------------------------------------------------------------------------------------------  Cardiac Enzymes No results for input(s): TROPONINI in the last 168 hours. ------------------------------------------------------------------------------------------------------------------  RADIOLOGY:  Dg Chest 2 View  Result Date: 03/30/2019 CLINICAL DATA:  Hypothermia EXAM: CHEST - 2 VIEW COMPARISON:  03/17/2019, 06/20/2018, CT 02/13/2019 FINDINGS: Right upper lobe ovoid nodular opacity as before. No new ac airspace disease or effusion. Stable cardiomediastinal  silhouette. No pneumothorax. IMPRESSION: No active cardiopulmonary disease. Stable right upper lobe nodular opacity. Electronically Signed   By: Donavan Foil M.D.   On: 03/30/2019 22:53   Dg Knee Complete 4 Views Left  Result Date: 03/31/2019 CLINICAL DATA:  Abscess. Open wound anterior left knee. EXAM: LEFT KNEE - COMPLETE 4+ VIEW COMPARISON:  Radiographs 2 weeks ago 03/17/2019 FINDINGS: No evidence of fracture, dislocation, or joint effusion. No bony destructive change or periosteal reaction. Soft tissue edema anteriorly with skin irregularity overlying the patella. No tracking soft tissue air. No radiopaque foreign body. IMPRESSION: 1. Soft tissue edema anterior to the patella. No radiopaque foreign body or osseous abnormality. 2. No radiographic evidence of osteomyelitis. Electronically Signed   By: Keith Rake M.D.   On: 03/31/2019 01:13      IMPRESSION AND PLAN:   1.  Hypoglycemia - Hypoglycemic protocol has been initiated - Patient received 1 amp D50 prior to arrival  2. diabetes mellitus - Sensitive sliding scale insulin - Diabetic educator consulted for uncontrolled diabetes -Hemoglobin A1c pending  3.  Left knee diabetic wound - IV Rocephin - Wound culture -Wound care nurse consulted -Blood cultures pending  4.  Acute on chronic renal failure [place] secondary to dehydration - Patient received IV bolus on arrival.  Will continue normal saline at 75 cc/h. - Repeat BMP in the a.m.  5.  Anemia --We will get stool for occult blood - We will get iron studies  DVT and PPI prophylaxis initiated     All the records are reviewed and case discussed with ED provider. The plan of care was discussed in details with the patient (and family). I answered all questions. The patient agreed to proceed with the above mentioned plan. Further management will depend upon hospital course.   CODE STATUS: Full code  TOTAL TIME TAKING CARE OF THIS PATIENT: 76minutes.    [place] on 03/31/2019 at 2:06 AM  Pager - [phone]  After 6pm go to [email or web address] - Technical brewer [facility]  Office  [phone]  CC: Primary care physician; [place]   Note: This dictation was prepared with Dragon dictation along with smaller Company secretary. Any transcriptional errors that result from this process are unintentional.

## 2019-03-31 NOTE — Progress Notes (Signed)
[place] [facility] [place] NAME: Gregory Moody    MR#:  [id number]  DATE OF BIRTH:  1976/03/21  SUBJECTIVE:  CHIEF COMPLAINT:   Chief Complaint  Patient presents with  . Hypoglycemia   Brought with drowsiness and hypoglycemia.  After having 2-3 episodes of hypoglycemia this morning his blood sugar bounced up and patient started eating and drinking well.  He had diarrhea chronically and his C. difficile is positive. REVIEW OF SYSTEMS:  CONSTITUTIONAL: No fever, fatigue or weakness.  EYES: No blurred or double vision.  EARS, NOSE, AND THROAT: No tinnitus or ear pain.  RESPIRATORY: No cough, shortness of breath, wheezing or hemoptysis.  CARDIOVASCULAR: No chest pain, orthopnea, edema.  GASTROINTESTINAL: No nausea, vomiting, have diarrhea or abdominal pain.  GENITOURINARY: No dysuria, hematuria.  ENDOCRINE: No polyuria, nocturia,  HEMATOLOGY: No anemia, easy bruising or bleeding SKIN: No rash or lesion. MUSCULOSKELETAL: No joint pain or arthritis.   NEUROLOGIC: No tingling, numbness, weakness.  PSYCHIATRY: No anxiety or depression.   ROS  DRUG ALLERGIES:  No Known Allergies  VITALS:  Blood pressure 114/78, pulse 66, temperature 97.6 F (36.4 C), temperature source Oral, resp. rate 18, height 5\' 8"  (1.727 m), weight 58 kg, SpO2 100 %.  PHYSICAL EXAMINATION:  GENERAL:  43 y.o.-year-old patient lying in the bed with no acute distress.  EYES: Pupils equal, round, reactive to light and accommodation. No scleral icterus. Extraocular muscles intact.  HEENT: Head atraumatic, normocephalic. Oropharynx and nasopharynx clear.  NECK:  Supple, no jugular venous distention. No thyroid enlargement, no tenderness.  LUNGS: Normal breath sounds bilaterally, no wheezing, rales,rhonchi or crepitation. No use of accessory muscles of respiration.  CARDIOVASCULAR: S1, S2 normal. No murmurs, rubs, or gallops.  ABDOMEN: Soft, nontender, nondistended. Bowel  sounds present. No organomegaly or mass.  EXTREMITIES: No pedal edema, cyanosis, or clubbing.  Left knee has some small cut which is chronic and does not look infected. NEUROLOGIC: Cranial nerves II through XII are intact. Muscle strength 5/5 in all extremities. Sensation intact. Gait not checked.  PSYCHIATRIC: The patient is alert and oriented x 3.  SKIN: No obvious rash, lesion, or ulcer.   Physical Exam LABORATORY PANEL:   CBC Recent Labs  Lab 03/31/19 0535  WBC 5.3  HGB 8.1*  HCT 25.0*  PLT 251   ------------------------------------------------------------------------------------------------------------------  Chemistries  Recent Labs  Lab 03/30/19 1914 03/31/19 0535  NA 136 141  K 3.7 4.7  CL 110 119*  CO2 16* 16*  GLUCOSE 113* 110*  BUN 32* 37*  CREATININE 2.43* 2.32*  CALCIUM 8.0* 8.5*  AST 47*  --   ALT 73*  --   ALKPHOS 111  --   BILITOT 0.2*  --    ------------------------------------------------------------------------------------------------------------------  Cardiac Enzymes No results for input(s): TROPONINI in the last 168 hours. ------------------------------------------------------------------------------------------------------------------  RADIOLOGY:  Dg Chest 2 View  Result Date: 03/30/2019 CLINICAL DATA:  Hypothermia EXAM: CHEST - 2 VIEW COMPARISON:  03/17/2019, 06/20/2018, CT 02/13/2019 FINDINGS: Right upper lobe ovoid nodular opacity as before. No new ac airspace disease or effusion. Stable cardiomediastinal silhouette. No pneumothorax. IMPRESSION: No active cardiopulmonary disease. Stable right upper lobe nodular opacity. Electronically Signed   By: Donavan Foil M.D.   On: 03/30/2019 22:53   Dg Knee Complete 4 Views Left  Result Date: 03/31/2019 CLINICAL DATA:  Abscess. Open wound anterior left knee. EXAM: LEFT KNEE - COMPLETE 4+ VIEW COMPARISON:  Radiographs 2 weeks ago 03/17/2019 FINDINGS: No evidence of fracture, dislocation,  or joint  effusion. No bony destructive change or periosteal reaction. Soft tissue edema anteriorly with skin irregularity overlying the patella. No tracking soft tissue air. No radiopaque foreign body. IMPRESSION: 1. Soft tissue edema anterior to the patella. No radiopaque foreign body or osseous abnormality. 2. No radiographic evidence of osteomyelitis. Electronically Signed   By: Keith Rake M.D.   On: 03/31/2019 01:13    ASSESSMENT AND PLAN:   Active Problems:   Hypoglycemia   Pressure injury of skin  1.  Hypoglycemia - Hypoglycemic protocol has been initiated - Patient received 1 amp D50 prior to arrival - planned to give dextrose drip, but sugar level stable- so will monitor.  2. diabetes mellitus - Sensitive sliding scale insulin - Diabetic educator consulted for uncontrolled diabetes -Will need to have lower dose long-acting insulin on discharge to maintain blood sugar.  3.  Left knee diabetic wound -Wound care nurse consulted -Blood cultures pending -Will not give unnecessary antibiotics due to C. difficile.  4.  Acute on chronic renal failure [place] secondary to dehydration - Patient received IV bolus on arrival.  Will continue normal saline at 75 cc/h. - Repeat BMP in the a.m.  5.  Anemia --Occult blood in stool is negative.  6. DVT and PPI prophylaxis initiated  7.  C. difficile-started on oral vancomycin.    All the records are reviewed and case discussed with Care Management/Social Workerr. Management plans discussed with the patient, family and they are in agreement.  CODE STATUS: Full  TOTAL TIME TAKING CARE OF THIS PATIENT: 35 minutes.     POSSIBLE D/C IN 1-2 DAYS, DEPENDING ON CLINICAL CONDITION.   [name] M.D on 03/31/2019   Between 7am to 6pm - Pager - [phone]  After 6pm go to [email or web address] - password [email or web address] [facility]  Office  [phone]  CC: Primary care physician; [place]  Note: This dictation was prepared with Dragon dictation along with smaller phrase technology. Any transcriptional errors that result from this process are unintentional.

## 2019-03-31 NOTE — ED Notes (Signed)
ED TO INPATIENT HANDOFF REPORT  ED Nurse Name and Phone #: Wells Guiles [phone]  S Name/Age/Gender Gregory Moody 43 y.o. male Room/Bed: ED19HA/ED19HA  Code Status   Code Status: Full Code  Home/SNF/Other Home Patient oriented to: self, place, time and situation Is this baseline? Yes   Triage Complete: Triage complete  Chief Complaint Weakness  Triage Note Pt arrives via ems from being found unresponsive underneath a tree in the yard, pt's fsbs was 30. Pt was given 250 ml of D10 by ems and an additional 100 ml. Ems reports pt awakened on the way to the ER and has been alert since. Pt's recheck fsbs by ems was 290.   Allergies No Known Allergies  Level of Care/Admitting Diagnosis ED Disposition    ED Disposition Condition [facility] Area: [place] [100120]  Level of Care: Med-Surg [16]  Covid Evaluation: Screening Protocol (No Symptoms)  Diagnosis: Hypoglycemia [093235]  Admitting Physician: Mayer Camel [[id number]]  Attending Physician: Mayer Camel [[id number]]  Estimated length of stay: past midnight tomorrow  Certification:: I certify this patient will need inpatient services for at least 2 midnights  PT Class (Do Not Modify): Inpatient [101]  PT Acc Code (Do Not Modify): Private [1]       B Medical/Surgery History Past Medical History:  Diagnosis Date  . Diabetes mellitus without complication ([place])   . Gastroparesis   . Tuberculosis    Past Surgical History:  Procedure Laterality Date  . ESOPHAGOGASTRODUODENOSCOPY N/A 02/03/2019   Procedure: ESOPHAGOGASTRODUODENOSCOPY (EGD);  Surgeon: Toledo, Benay Pike, MD;  Location: [facility] ENDOSCOPY;  Service: Gastroenterology;  Laterality: N/A;  . NO PAST SURGERIES       A IV Location/Drains/Wounds Patient Lines/Drains/Airways Status   Active Line/Drains/Airways    Name:   Placement date:   Placement time:   Site:   Days:   Peripheral IV 03/30/19 Right Arm   03/30/19    1913     Arm   1   Peripheral IV 03/30/19 Right Antecubital   03/30/19    2302    Antecubital   1          Intake/Output Last 24 hours No intake or output data in the 24 hours ending 03/31/19 0205  Labs/Imaging Results for orders placed or performed during the hospital encounter of 03/30/19 (from the past 48 hour(s))  Glucose, capillary     Status: None   Collection Time: 03/30/19  7:12 PM  Result Value Ref Range   Glucose-Capillary 99 70 - 99 mg/dL  CBC     Status: Abnormal   Collection Time: 03/30/19  7:14 PM  Result Value Ref Range   WBC 5.5 4.0 - 10.5 K/uL   RBC 2.76 (L) 4.22 - 5.81 MIL/uL   Hemoglobin 8.4 (L) 13.0 - 17.0 g/dL   HCT 25.5 (L) 39.0 - 52.0 %   MCV 92.4 80.0 - 100.0 fL   MCH 30.4 26.0 - 34.0 pg   MCHC 32.9 30.0 - 36.0 g/dL   RDW 14.0 11.5 - 15.5 %   Platelets 272 150 - 400 K/uL   nRBC 0.0 0.0 - 0.2 %    Comment: Performed at [facility], [address] [place], [place] [address]  Basic metabolic panel     Status: Abnormal   Collection Time: 03/30/19  7:14 PM  Result Value Ref Range   Sodium 136 135 - 145 mmol/L   Potassium 3.7 3.5 - 5.1 mmol/L  Chloride 110 98 - 111 mmol/L   CO2 16 (L) 22 - 32 mmol/L   Glucose, Bld 113 (H) 70 - 99 mg/dL   BUN 32 (H) 6 - 20 mg/dL   Creatinine, Ser 2.43 (H) 0.61 - 1.24 mg/dL   Calcium 8.0 (L) 8.9 - 10.3 mg/dL   GFR calc non Af Amer 31 (L) >60 mL/min   GFR calc Af Amer 36 (L) >60 mL/min   Anion gap 10 5 - 15    Comment: Performed at [facility], [address] [place], [place] [address]  Hepatic function panel     Status: Abnormal   Collection Time: 03/30/19  7:14 PM  Result Value Ref Range   Total Protein 7.1 6.5 - 8.1 g/dL   Albumin 3.5 3.5 - 5.0 g/dL   AST 47 (H) 15 - 41 U/L   ALT 73 (H) 0 - 44 U/L   Alkaline Phosphatase 111 38 - 126 U/L   Total Bilirubin 0.2 (L) 0.3 - 1.2 mg/dL   Bilirubin, Direct <0.1 0.0 - 0.2 mg/dL   Indirect Bilirubin NOT CALCULATED 0.3 - 0.9 mg/dL    Comment: Performed  at [facility], [address] [place], [place] [address]  Glucose, capillary     Status: Abnormal   Collection Time: 03/30/19  9:08 PM  Result Value Ref Range   Glucose-Capillary 125 (H) 70 - 99 mg/dL  TSH     Status: None   Collection Time: 03/30/19 10:59 PM  Result Value Ref Range   TSH 0.573 0.350 - 4.500 uIU/mL    Comment: Performed by a 3rd Generation assay with a functional sensitivity of <=0.01 uIU/mL. Performed at [facility], [address] [place], [place] [address]   CK     Status: None   Collection Time: 03/30/19 10:59 PM  Result Value Ref Range   Total CK 365 49 - 397 U/L    Comment: Performed at [facility], [address] [place], [place] [address]  Brain natriuretic peptide     Status: None   Collection Time: 03/30/19 11:00 PM  Result Value Ref Range   B Natriuretic Peptide 55.0 0.0 - 100.0 pg/mL    Comment: Performed at [facility], [address] [place], [place] [address]  Glucose, capillary     Status: Abnormal   Collection Time: 03/30/19 11:19 PM  Result Value Ref Range   Glucose-Capillary 288 (H) 70 - 99 mg/dL  Glucose, capillary     Status: Abnormal   Collection Time: 03/31/19 12:56 AM  Result Value Ref Range   Glucose-Capillary 246 (H) 70 - 99 mg/dL  Glucose, capillary     Status: Abnormal   Collection Time: 03/31/19  1:46 AM  Result Value Ref Range   Glucose-Capillary 181 (H) 70 - 99 mg/dL   Dg Chest 2 View  Result Date: 03/30/2019 CLINICAL DATA:  Hypothermia EXAM: CHEST - 2 VIEW COMPARISON:  03/17/2019, 06/20/2018, CT 02/13/2019 FINDINGS: Right upper lobe ovoid nodular opacity as before. No new ac airspace disease or effusion. Stable cardiomediastinal silhouette. No pneumothorax. IMPRESSION: No active cardiopulmonary disease. Stable right upper lobe nodular opacity. Electronically Signed   By: Donavan Foil M.D.   On: 03/30/2019 22:53   Dg Knee Complete 4 Views Left  Result Date: 03/31/2019 CLINICAL  DATA:  Abscess. Open wound anterior left knee. EXAM: LEFT KNEE - COMPLETE 4+ VIEW COMPARISON:  Radiographs 2 weeks ago 03/17/2019 FINDINGS: No evidence of fracture, dislocation, or joint effusion.  No bony destructive change or periosteal reaction. Soft tissue edema anteriorly with skin irregularity overlying the patella. No tracking soft tissue air. No radiopaque foreign body. IMPRESSION: 1. Soft tissue edema anterior to the patella. No radiopaque foreign body or osseous abnormality. 2. No radiographic evidence of osteomyelitis. Electronically Signed   By: Keith Rake M.D.   On: 03/31/2019 01:13    Pending Labs Unresulted Labs (From admission, onward)    Start     Ordered   03/31/19 5284  Basic metabolic panel  Tomorrow morning,   STAT     03/30/19 2349   03/31/19 0500  CBC  Tomorrow morning,   STAT     03/30/19 2349   03/30/19 2350  Urine culture  Once,   STAT    Question:  Patient immune status  Answer:  Normal   03/30/19 2349   03/30/19 2350  Hemoglobin A1c  Once,   STAT     03/30/19 2349   03/30/19 2304  C difficile quick scan w PCR reflex  (C Difficile quick screen w PCR reflex panel)  Once, for 24 hours,   STAT     03/30/19 2303   03/30/19 2303  Gastrointestinal Panel by PCR , Stool  (Gastrointestinal Panel by PCR, Stool)  ONCE - STAT,   STAT     03/30/19 2303   03/30/19 2230  Novel Coronavirus,NAA,(SEND-OUT TO REF LAB - TAT 24-48 hrs); Hosp Order  (Asymptomatic Patients Labs)  ONCE - STAT,   STAT    Question:  Rule Out  Answer:  Yes   03/30/19 2229   03/30/19 2223  Urinalysis, Complete w Microscopic  Once,   STAT     03/30/19 2222   03/30/19 2223  Culture, blood (Routine X 2) w Reflex to ID Panel  BLOOD CULTURE X 2,   STAT     03/30/19 2222   03/30/19 2221  Cortisol  Add-on,   AD     03/30/19 2221          Vitals/Pain Today's Vitals   03/30/19 1909 03/30/19 1915 03/30/19 2155 03/31/19 0132  BP:   104/73   Pulse:   72   Resp:   18   Temp:    97.8 F (36.6 C)   TempSrc:   Rectal Oral  SpO2:   100%   PainSc: 0-No pain 0-No pain      Isolation Precautions No active isolations  Medications Medications  pantoprazole (PROTONIX) EC tablet 40 mg (has no administration in time range)  polyethylene glycol (MIRALAX / GLYCOLAX) packet 17 g (has no administration in time range)  ondansetron (ZOFRAN) tablet 4 mg (has no administration in time range)    Or  ondansetron (ZOFRAN) injection 4 mg (has no administration in time range)  sodium chloride flush (NS) 0.9 % injection 3 mL (3 mLs Intravenous Not Given 03/31/19 0042)  sodium chloride flush (NS) 0.9 % injection 3 mL (has no administration in time range)  0.9 %  sodium chloride infusion (has no administration in time range)  insulin aspart protamine- aspart (NOVOLOG MIX 70/30) injection 20 Units (20 Units Subcutaneous Given 03/31/19 0147)  cefTRIAXone (ROCEPHIN) 2 g in sodium chloride 0.9 % 100 mL IVPB (has no administration in time range)    Mobility walks High fall risk   Focused Assessments Cardiac Assessment Handoff:  Cardiac Rhythm: Normal sinus rhythm Lab Results  Component Value Date   CKTOTAL 365 03/30/2019   TROPONINI <0.03 03/06/2019   No results found for:  DDIMER Does the Patient currently have chest pain? No     R Recommendations: See Admitting Provider Note  Report given to:   Additional Notes:

## 2019-03-31 NOTE — Progress Notes (Signed)
Inpatient Diabetes Program Recommendations  AACE/ADA: New Consensus Statement on Inpatient Glycemic Control (2015)  Target Ranges:  Prepandial:   less than 140 mg/dL      Peak postprandial:   less than 180 mg/dL (1-2 hours)      Critically ill patients:  140 - 180 mg/dL   Lab Results  Component Value Date   GLUCAP 63 (L) 03/31/2019   HGBA1C 8.6 (H) 03/07/2019    Review of Glycemic Control Results for REFUGIO, VANDEVOORDE (MRN [id number]) as of 03/31/2019 09:26  Ref. Range 03/31/2019 04:38 03/31/2019 07:37 03/31/2019 08:20 03/31/2019 09:01  Glucose-Capillary Latest Ref Range: 70 - 99 mg/dL 100 (H) 52 (L) 59 (L) 63 (L)   Diabetes history: Type 2 Dm Outpatient Diabetes medications: Novolin 70/30- 25 units QAM, 35 units QPM Current orders for Inpatient glycemic control: none  Inpatient Diabetes Program Recommendations:    Noted severe lows contributing to EMS call. Of note this is the second visit for hypoglycemia in last 2 months. Additional lows this AM following 70/30 20 units administration. Once trends are >180 mg/dL consider adding novolog 0-9 units TID & HS.   Spoke with patient regarding hypoglycemia. Patient states, "yesterday morning, I took 45 units of 70/30 because my blood sugar was high in the 300's mg/dL." Reports his PCP recently made insulin adjustments of 70/30-30 units QAM, 35 units QPM. Admits to missing does out of fear that he will be low, then because of the missed dose he is in the 300's by the afternoon.  Reviewed patient's current A1c of 8.6%. Explained what a A1c is and what it measures. Also reviewed goal A1c with patient, importance of good glucose control @ home, and blood sugar goals. Reviewed patho of DM, need for insulin, role of pancreas, impact of missing doses, survival skills, symptoms of hypo vs hyper glycemia, vascular changes and co-morbidities. Patient fears administering insulin in AM when FSBS is <120 mg/dL. Reviewed importance and principles of 70/30,  sick day rules and the need to communicate with provider instead of day-to-day adjustments due to risk of hypoglycemia. Reviewed insulin administration while inpatient and patient continues to be low this AM. Patient states he is eating well. Given this, anticipate need for reduction to Novolin 70/30 at discharge. Patient is scheduled to have follow up with PCP. Again stressed the importance of not missing doses, discussed glucose tabs, reviewed when to call MD and discouraged adjusted doses. Patient has no further questions at this time.  Secure chat sent to MD. Thanks, Bronson Curb, MSN, RNC-OB Diabetes Coordinator [phone] ([phone])

## 2019-03-31 NOTE — ED Notes (Signed)
Pt given diet tray and water.

## 2019-04-01 ENCOUNTER — Other Ambulatory Visit: Payer: Self-pay

## 2019-04-01 ENCOUNTER — Observation Stay
Admission: EM | Admit: 2019-04-01 | Discharge: 2019-04-03 | Disposition: A | Discharge status: Home / Self Care | Payer: Self-pay | Attending: Internal Medicine | Admitting: Internal Medicine

## 2019-04-01 DIAGNOSIS — L97829 Non-pressure chronic ulcer of other part of left lower leg with unspecified severity: Secondary | ICD-10-CM | POA: Insufficient documentation

## 2019-04-01 DIAGNOSIS — E872 Acidosis: Secondary | ICD-10-CM | POA: Insufficient documentation

## 2019-04-01 DIAGNOSIS — Z79899 Other long term (current) drug therapy: Secondary | ICD-10-CM | POA: Insufficient documentation

## 2019-04-01 DIAGNOSIS — K210 Gastro-esophageal reflux disease with esophagitis: Secondary | ICD-10-CM | POA: Insufficient documentation

## 2019-04-01 DIAGNOSIS — E1122 Type 2 diabetes mellitus with diabetic chronic kidney disease: Secondary | ICD-10-CM | POA: Insufficient documentation

## 2019-04-01 DIAGNOSIS — Z7982 Long term (current) use of aspirin: Secondary | ICD-10-CM | POA: Insufficient documentation

## 2019-04-01 DIAGNOSIS — R197 Diarrhea, unspecified: Secondary | ICD-10-CM | POA: Insufficient documentation

## 2019-04-01 DIAGNOSIS — Z8611 Personal history of tuberculosis: Secondary | ICD-10-CM | POA: Insufficient documentation

## 2019-04-01 DIAGNOSIS — I951 Orthostatic hypotension: Principal | ICD-10-CM | POA: Insufficient documentation

## 2019-04-01 DIAGNOSIS — B9689 Other specified bacterial agents as the cause of diseases classified elsewhere: Secondary | ICD-10-CM | POA: Insufficient documentation

## 2019-04-01 DIAGNOSIS — R112 Nausea with vomiting, unspecified: Secondary | ICD-10-CM | POA: Insufficient documentation

## 2019-04-01 DIAGNOSIS — N183 Chronic kidney disease, stage 3 (moderate): Secondary | ICD-10-CM | POA: Insufficient documentation

## 2019-04-01 DIAGNOSIS — E860 Dehydration: Secondary | ICD-10-CM | POA: Insufficient documentation

## 2019-04-01 DIAGNOSIS — K3184 Gastroparesis: Secondary | ICD-10-CM | POA: Insufficient documentation

## 2019-04-01 DIAGNOSIS — Z794 Long term (current) use of insulin: Secondary | ICD-10-CM | POA: Insufficient documentation

## 2019-04-01 DIAGNOSIS — N179 Acute kidney failure, unspecified: Secondary | ICD-10-CM | POA: Insufficient documentation

## 2019-04-01 DIAGNOSIS — E1143 Type 2 diabetes mellitus with diabetic autonomic (poly)neuropathy: Secondary | ICD-10-CM | POA: Insufficient documentation

## 2019-04-01 LAB — COMPREHENSIVE METABOLIC PANEL
ALT: 51 U/L — ABNORMAL HIGH (ref 0–44)
AST: 26 U/L (ref 15–41)
Albumin: 3.8 g/dL (ref 3.5–5.0)
Alkaline Phosphatase: 117 U/L (ref 38–126)
Anion gap: 7 (ref 5–15)
BUN: 39 mg/dL — ABNORMAL HIGH (ref 6–20)
CO2: 12 mmol/L — ABNORMAL LOW (ref 22–32)
Calcium: 8.8 mg/dL — ABNORMAL LOW (ref 8.9–10.3)
Chloride: 117 mmol/L — ABNORMAL HIGH (ref 98–111)
Creatinine, Ser: 2.98 mg/dL — ABNORMAL HIGH (ref 0.61–1.24)
GFR calc Af Amer: 28 mL/min — ABNORMAL LOW (ref >60)
GFR calc non Af Amer: 25 mL/min — ABNORMAL LOW (ref >60)
Glucose, Bld: 172 mg/dL — ABNORMAL HIGH (ref 70–99)
Potassium: 4.8 mmol/L (ref 3.5–5.1)
Sodium: 136 mmol/L (ref 135–145)
Total Bilirubin: 0.4 mg/dL (ref 0.3–1.2)
Total Protein: 7.7 g/dL (ref 6.5–8.1)

## 2019-04-01 LAB — CK: Total CK: 193 U/L (ref 49–397)

## 2019-04-01 LAB — GLUCOSE, CAPILLARY
Glucose-Capillary: 140 mg/dL — ABNORMAL HIGH (ref 70–99)
Glucose-Capillary: 143 mg/dL — ABNORMAL HIGH (ref 70–99)
Glucose-Capillary: 150 mg/dL — ABNORMAL HIGH (ref 70–99)
Glucose-Capillary: 159 mg/dL — ABNORMAL HIGH (ref 70–99)
Glucose-Capillary: 270 mg/dL — ABNORMAL HIGH (ref 70–99)

## 2019-04-01 LAB — CBC
HCT: 27.4 % — ABNORMAL LOW (ref 39.0–52.0)
Hemoglobin: 8.6 g/dL — ABNORMAL LOW (ref 13.0–17.0)
MCH: 30.3 pg (ref 26.0–34.0)
MCHC: 31.4 g/dL (ref 30.0–36.0)
MCV: 96.5 fL (ref 80.0–100.0)
Platelets: 307 10*3/uL (ref 150–400)
RBC: 2.84 MIL/uL — ABNORMAL LOW (ref 4.22–5.81)
RDW: 14.4 % (ref 11.5–15.5)
WBC: 7.7 10*3/uL (ref 4.0–10.5)
nRBC: 0 % (ref 0.0–0.2)

## 2019-04-01 LAB — BASIC METABOLIC PANEL
Anion gap: 6 (ref 5–15)
BUN: 33 mg/dL — ABNORMAL HIGH (ref 6–20)
CO2: 15 mmol/L — ABNORMAL LOW (ref 22–32)
Calcium: 8.6 mg/dL — ABNORMAL LOW (ref 8.9–10.3)
Chloride: 114 mmol/L — ABNORMAL HIGH (ref 98–111)
Creatinine, Ser: 2.06 mg/dL — ABNORMAL HIGH (ref 0.61–1.24)
GFR calc Af Amer: 44 mL/min — ABNORMAL LOW (ref >60)
GFR calc non Af Amer: 38 mL/min — ABNORMAL LOW (ref >60)
Glucose, Bld: 307 mg/dL — ABNORMAL HIGH (ref 70–99)
Potassium: 5.2 mmol/L — ABNORMAL HIGH (ref 3.5–5.1)
Sodium: 135 mmol/L (ref 135–145)

## 2019-04-01 LAB — ETHANOL: Alcohol, Ethyl (B): <10 mg/dL (ref <10)

## 2019-04-01 MED ORDER — ONDANSETRON HCL 4 MG/2ML IJ SOLN
4.0000 mg | Freq: Once | INTRAMUSCULAR | Status: AC
  Administered 2019-04-01: 23:00:00 4 mg via INTRAVENOUS
  Filled 2019-04-01: qty 2

## 2019-04-01 MED ORDER — VANCOMYCIN HCL 125 MG PO CAPS: 125.0000 mg | ORAL_CAPSULE | Freq: Four times a day (QID) | ORAL | 0 refills | Status: DC

## 2019-04-01 MED ORDER — INSULIN NPH ISOPHANE & REGULAR (70-30) 100 UNIT/ML ~~LOC~~ SUSP: 15.0000 [IU] | Freq: Two times a day (BID) | SUBCUTANEOUS | 11 refills | Status: DC

## 2019-04-01 MED ORDER — VANCOMYCIN HCL 125 MG PO CAPS: 125.0000 mg | ORAL_CAPSULE | Freq: Four times a day (QID) | ORAL | 0 refills | Status: AC

## 2019-04-01 MED ORDER — VANCOMYCIN 50 MG/ML ORAL SOLUTION: 125.0000 mg | Freq: Four times a day (QID) | ORAL | 0 refills | Status: DC

## 2019-04-01 MED ORDER — SODIUM CHLORIDE 0.9 % IV BOLUS
1000.0000 mL | Freq: Once | INTRAVENOUS | Status: AC
  Administered 2019-04-01: 1000 mL via INTRAVENOUS

## 2019-04-01 MED ORDER — SODIUM ZIRCONIUM CYCLOSILICATE 10 G PO PACK
10.0000 g | PACK | Freq: Once | ORAL | Status: AC
  Administered 2019-04-01: 14:00:00 10 g via ORAL
  Filled 2019-04-01: qty 1

## 2019-04-01 NOTE — ED Notes (Signed)
Report to rebecca, rn.  

## 2019-04-01 NOTE — Discharge Summary (Addendum)
[facility] NAME: Gregory Moody    MR#:  [id number]  DATE OF BIRTH:  08/28/1976  DATE OF ADMISSION:  03/30/2019 ADMITTING PHYSICIAN: Christel Mormon, MD  DATE OF DISCHARGE: 04/01/2019   PRIMARY CARE PHYSICIAN: [place]    ADMISSION DIAGNOSIS:  Hypothermia, initial encounter [T68.XXXA]  DISCHARGE DIAGNOSIS:  Active Problems:   Hypoglycemia   Pressure injury of skin   SECONDARY DIAGNOSIS:   Past Medical History:  Diagnosis Date  . Diabetes mellitus without complication ([place])   . Gastroparesis   . Tuberculosis     HOSPITAL COURSE:   1.Hypoglycemia -Hypoglycemic protocol has been initiated -Patient received 1 amp D50 prior to arrival - planned to give dextrose drip, but sugar level stable- so will monitor.  Blood sugar was stable, decrease dose of 70-30. He was taking 30 Un BID, decrease to 15 U BID.  2.diabetes mellitus -Sensitive sliding scale insulin -Diabetic educator consulted for uncontrolled diabetes -Will need to have lower dose long-acting insulin on discharge to maintain blood sugar.  3. Left knee diabetic wound -Wound care nurse consulted -Blood cultures pending -Will not give unnecessary antibiotics due to C. Difficile and chronic diarrhea.  4.Acute on chronic renal failure [place] secondary to dehydration -Patient received IV bolus on arrival. Will continue normal saline at 75 cc/h. -Repeat BMP in the a.m. Creatinine is 2 now.  5. Anemia --Occult blood in stool is negative.  6. DVT and PPI prophylaxis initiated  7.  C. difficile-started on oral vancomycin.   Follow in GI clinic.   DISCHARGE CONDITIONS:   Stable.  CONSULTS OBTAINED:    DRUG ALLERGIES:  No Known Allergies  DISCHARGE MEDICATIONS:   Allergies as of 04/01/2019   No Known Allergies     Medication List    STOP taking these medications   FLUoxetine 20 MG  capsule Commonly known as: PROZAC   HYDROcodone-acetaminophen 5-325 MG tablet Commonly known as: NORCO/VICODIN   ondansetron 4 MG disintegrating tablet Commonly known as: Zofran ODT     TAKE these medications   aspirin EC 81 MG tablet Take 81 mg by mouth daily.   insulin NPH-regular Human (70-30) 100 UNIT/ML injection Inject 15 Units into the skin 2 (two) times daily with a meal. 25 units with breakfast in am and 35 units with supper What changed: how much to take   pantoprazole 40 MG tablet Commonly known as: PROTONIX Take 1 tablet (40 mg total) by mouth daily.   vancomycin 125 MG capsule Commonly known as: Vancocin HCl Take 1 capsule (125 mg total) by mouth 4 (four) times daily for 8 days.        DISCHARGE INSTRUCTIONS:    Follow with Gi clinic in 1-2 weeks.  If you experience worsening of your admission symptoms, develop shortness of breath, life threatening emergency, suicidal or homicidal thoughts you must seek medical attention immediately by calling 911 or calling your MD immediately  if symptoms less severe.  You Must read complete instructions/literature along with all the possible adverse reactions/side effects for all the Medicines you take and that have been prescribed to you. Take any new Medicines after you have completely understood and accept all the possible adverse reactions/side effects.   Please note  You were cared for by a hospitalist during your hospital stay. If you have any questions about your discharge medications or the care you received while you were in the hospital after you are discharged, you  can call the unit and asked to speak with the hospitalist on call if the hospitalist that took care of you is not available. Once you are discharged, your primary care physician will handle any further medical issues. Please note that NO REFILLS for any discharge medications will be authorized once you are discharged, as it is imperative that you return  to your primary care physician (or establish a relationship with a primary care physician if you do not have one) for your aftercare needs so that they can reassess your need for medications and monitor your lab values.    Today   CHIEF COMPLAINT:   Chief Complaint  Patient presents with  . Hypoglycemia    HISTORY OF PRESENT ILLNESS:  Gregory Moody  is a 43 y.o. male with a known history of diabetes mellitus, tuberculosis status post treatment, and gastroparesis.  He presented to the emergency room with hypoglycemia.  Patient reportedly had gone outside earlier in the day and lay down under the bushes.  He had been trimming bushes earlier in the day around his home.  He began feeling tired and laid down.  He was awakened by EMS services.  He feels that he was on the ground about 2 hours.  EMS reported glucose of 30 which was treated with D50.  Glucose on arrival to the emergency room was 113.  However patient was found to be hypothermic with temperature 94.7.  He also had 1 loose bowel movement in the emergency room.  Patient continued to be slightly lethargic however would not awaken to conversation.  Warming blanket was placed on the patient with his temperature increasing to 97.8.  He denies recent illness.  He denies fevers or chills.  He denies abdominal pain.  He denies chest pain, shortness of breath.  He denies nausea or vomiting.  Patient denies recent diarrhea however he was noted to have 1 loose bowel movement while in the emergency room.  No hematemesis, hematochezia, or melena according to the patient.  On my examination of the patient there is a 7 cm ulceration to his left knee which he reports has been there for several months.  He denies having had prior evaluation or treatment.  X-ray demonstrates surrounding edema however no evidence of osteomyelitis.  On arrival to the emergency room, creatinine is 2.43 with BUN 32.  Hemoglobin is 8.4 with hematocrit 25.5.  WBC is 5.5.   Glucose is 113.  Lactic acid is 1.7.  We have admitted him to the hospitalist service for further management.    VITAL SIGNS:  Blood pressure 116/73, pulse 82, temperature 97.9 F (36.6 C), temperature source Oral, resp. rate 18, height 5\' 8"  (1.727 m), weight 58 kg, SpO2 100 %.  I/O:    Intake/Output Summary (Last 24 hours) at 04/01/2019 1410 Last data filed at 03/31/2019 1500 Gross per 24 hour  Intake 100 ml  Output -  Net 100 ml    PHYSICAL EXAMINATION:   GENERAL:  43 y.o.-year-old patient lying in the bed with no acute distress.  EYES: Pupils equal, round, reactive to light and accommodation. No scleral icterus. Extraocular muscles intact.  HEENT: Head atraumatic, normocephalic. Oropharynx and nasopharynx clear.  NECK:  Supple, no jugular venous distention. No thyroid enlargement, no tenderness.  LUNGS: Normal breath sounds bilaterally, no wheezing, rales,rhonchi or crepitation. No use of accessory muscles of respiration.  CARDIOVASCULAR: S1, S2 normal. No murmurs, rubs, or gallops.  ABDOMEN: Soft, nontender, nondistended. Bowel sounds present. No organomegaly or mass.  EXTREMITIES: No pedal edema, cyanosis, or clubbing.  Left knee has some small cut which is chronic and does not look infected. NEUROLOGIC: Cranial nerves II through XII are intact. Muscle strength 5/5 in all extremities. Sensation intact. Gait not checked.  PSYCHIATRIC: The patient is alert and oriented x 3.  SKIN: No obvious rash, lesion, or ulcer.   DATA REVIEW:   CBC Recent Labs  Lab 03/31/19 0535  WBC 5.3  HGB 8.1*  HCT 25.0*  PLT 251    Chemistries  Recent Labs  Lab 03/30/19 1914  04/01/19 1011  NA 136   < > 135  K 3.7   < > 5.2*  CL 110   < > 114*  CO2 16*   < > 15*  GLUCOSE 113*   < > 307*  BUN 32*   < > 33*  CREATININE 2.43*   < > 2.06*  CALCIUM 8.0*   < > 8.6*  AST 47*  --   --   ALT 73*  --   --   ALKPHOS 111  --   --   BILITOT 0.2*  --   --    < > = values in this interval  not displayed.    Cardiac Enzymes No results for input(s): TROPONINI in the last 168 hours.  Microbiology Results  Results for orders placed or performed during the hospital encounter of 03/30/19  Culture, blood (Routine X 2) w Reflex to ID Panel     Status: None (Preliminary result)   Collection Time: 03/30/19 10:59 PM   Specimen: BLOOD  Result Value Ref Range Status   Specimen Description   Final    BLOOD Blood Culture results may not be optimal due to an excessive volume of blood received in culture bottles   Special Requests   Final    BOTTLES DRAWN AEROBIC AND ANAEROBIC LEFT ANTECUBITAL   Culture   Final    NO GROWTH 2 DAYS Performed at [facility], [address], [place], [place] [address]    Report Status PENDING  Incomplete  Culture, blood (Routine X 2) w Reflex to ID Panel     Status: None (Preliminary result)   Collection Time: 03/30/19 11:01 PM   Specimen: BLOOD  Result Value Ref Range Status   Specimen Description   Final    BLOOD Blood Culture results may not be optimal due to an excessive volume of blood received in culture bottles   Special Requests   Final    BOTTLES DRAWN AEROBIC AND ANAEROBIC BLOOD LEFT HAND   Culture   Final    NO GROWTH 2 DAYS Performed at [facility], [address] [place], [place] [address]    Report Status PENDING  Incomplete  Gastrointestinal Panel by PCR , Stool     Status: None   Collection Time: 03/31/19 10:35 AM   Specimen: Stool  Result Value Ref Range Status   Campylobacter species NOT DETECTED NOT DETECTED Final   Plesimonas shigelloides NOT DETECTED NOT DETECTED Final   Salmonella species NOT DETECTED NOT DETECTED Final   Yersinia enterocolitica NOT DETECTED NOT DETECTED Final   Vibrio species NOT DETECTED NOT DETECTED Final   Vibrio cholerae NOT DETECTED NOT DETECTED Final   Enteroaggregative E coli (EAEC) NOT DETECTED NOT DETECTED Final   Enteropathogenic E coli (EPEC) NOT DETECTED NOT DETECTED  Final   Enterotoxigenic E coli (ETEC) NOT DETECTED NOT DETECTED Final   Shiga like toxin producing E coli (STEC) NOT  DETECTED NOT DETECTED Final   Shigella/Enteroinvasive E coli (EIEC) NOT DETECTED NOT DETECTED Final   Cryptosporidium NOT DETECTED NOT DETECTED Final   Cyclospora cayetanensis NOT DETECTED NOT DETECTED Final   Entamoeba histolytica NOT DETECTED NOT DETECTED Final   Giardia lamblia NOT DETECTED NOT DETECTED Final   Adenovirus F40/41 NOT DETECTED NOT DETECTED Final   Astrovirus NOT DETECTED NOT DETECTED Final   Norovirus GI/GII NOT DETECTED NOT DETECTED Final   Rotavirus A NOT DETECTED NOT DETECTED Final   Sapovirus (I, II, IV, and V) NOT DETECTED NOT DETECTED Final    Comment: Performed at [facility], [address] [place], [place] [address]  C difficile quick scan w PCR reflex     Status: Abnormal   Collection Time: 03/31/19 10:35 AM   Specimen: STOOL  Result Value Ref Range Status   C Diff antigen POSITIVE (A) NEGATIVE Final   C Diff toxin NEGATIVE NEGATIVE Final   C Diff interpretation Results are indeterminate. See PCR results.  Final    Comment: Performed at [facility], [address] [place], [place] [address]  C. Diff by PCR, Reflexed     Status: Abnormal   Collection Time: 03/31/19 10:35 AM  Result Value Ref Range Status   Toxigenic C. Difficile by PCR POSITIVE (A) NEGATIVE Final    Comment: Positive for toxigenic C. difficile with little to no toxin production. Only treat if clinical presentation suggests symptomatic illness. Performed at [facility], [address] [place],  [address]     RADIOLOGY:  Dg Chest 2 View  Result Date: 03/30/2019 CLINICAL DATA:  Hypothermia EXAM: CHEST - 2 VIEW COMPARISON:  03/17/2019, 06/20/2018, CT 02/13/2019 FINDINGS: Right upper lobe ovoid nodular opacity as before. No new ac airspace disease or effusion. Stable cardiomediastinal silhouette. No pneumothorax. IMPRESSION: No active  cardiopulmonary disease. Stable right upper lobe nodular opacity. Electronically Signed   By: Donavan Foil M.D.   On: 03/30/2019 22:53   Dg Knee Complete 4 Views Left  Result Date: 03/31/2019 CLINICAL DATA:  Abscess. Open wound anterior left knee. EXAM: LEFT KNEE - COMPLETE 4+ VIEW COMPARISON:  Radiographs 2 weeks ago 03/17/2019 FINDINGS: No evidence of fracture, dislocation, or joint effusion. No bony destructive change or periosteal reaction. Soft tissue edema anteriorly with skin irregularity overlying the patella. No tracking soft tissue air. No radiopaque foreign body. IMPRESSION: 1. Soft tissue edema anterior to the patella. No radiopaque foreign body or osseous abnormality. 2. No radiographic evidence of osteomyelitis. Electronically Signed   By: Keith Rake M.D.   On: 03/31/2019 01:13    EKG:   Orders placed or performed during the hospital encounter of 03/30/19  . ED EKG  . ED EKG      Management plans discussed with the patient, family and they are in agreement.  CODE STATUS:     Code Status Orders  (From admission, onward)         Start     Ordered   03/30/19 2350  Full code  Continuous     03/30/19 2349        Code Status History    Date Active Date Inactive Code Status Order ID Comments User Context   03/06/2019 2256 03/07/2019 2039 Full Code [id number]  Mayer Camel, NP ED   02/14/2019 0456 02/15/2019 2030 Full Code [id number]  Harrie Foreman, MD Inpatient   02/02/2019 1729 02/04/2019 1804 Full Code [id number]  Hillary Bow, MD ED   01/03/2019 1944  01/04/2019 2003 Full Code [id number]  Gorden Harms, MD Inpatient   02/23/2018 0126 02/24/2018 1539 Full Code [id number]  Lance Coon, MD ED   04/22/2017 0004 04/22/2017 2213 Full Code [id number]  Lance Coon, MD Inpatient   04/17/2017 2234 04/19/2017 2005 Full Code [id number]  [name], MD Inpatient   04/06/2016 1907 04/10/2016 1928 Full Code [id number]  Loletha Grayer, MD ED   Advance Care Planning  Activity      TOTAL TIME TAKING CARE OF THIS PATIENT: 35 minutes.    [name] M.D on 04/01/2019 at 2:10 PM  Between 7am to 6pm - Pager - [phone]  After 6pm go to [email or web address] - password [email or web address] [facility]  Office  [phone]  CC: Primary care physician; [place]   Note: This dictation was prepared with Dragon dictation along with smaller phrase technology. Any transcriptional errors that result from this process are unintentional.

## 2019-04-01 NOTE — Clinical Social Work Note (Addendum)
CSW attempted to give patient Match letter for his vancomycin, however CSW went to patient's room and he had left already.  CSW attempted to provide [facility] letter coupon for patient.  Jones Broom. Norval Morton, MSW, LCSW [phone]  04/01/2019 2:38 PM

## 2019-04-01 NOTE — ED Provider Notes (Signed)
[facility] Emergency Department Provider Note   ____________________________________________   First MD Initiated Contact with Patient 04/01/19 2310     (approximate)  I have reviewed the triage vital signs and the nursing notes.   HISTORY  Chief Complaint Emesis    HPI Jaquell Seddon is a 43 y.o. male who returns to the ED for vomiting and diarrhea.  Patient was just discharged from the hospital approximately 12 hours ago for same.  History of diabetes with 3 ED visits within the past month for nausea/vomiting thought to be due to gastroparesis.  States he still felt bad when he left the hospital.  Denies new fever, cough, chest pain, shortness of breath, dysuria.  Complains of lower abdominal cramps when he has diarrhea.       Past Medical History:  Diagnosis Date  . Diabetes mellitus without complication ([place])   . Gastroparesis   . Tuberculosis     Patient Active Problem List   Diagnosis Date Noted  . Pressure injury of skin 03/31/2019  . Acute renal failure (ARF) ([place]) 03/06/2019  . CAP (community acquired pneumonia) 02/14/2019  . AKI (acute kidney injury) ([place]) 02/02/2019  . ARF (acute renal failure) ([place]) 01/03/2019  . Malnutrition of moderate degree 02/24/2018  . GERD (gastroesophageal reflux disease) 02/23/2018  . Diabetic gastroparesis ([place]) 02/23/2018  . Diabetic foot ulcer ([place]) 02/23/2018  . Aspiration pneumonia ([place]) 04/21/2017  . Hypoglycemia 04/21/2017  . Diabetes mellitus with hyperglycemia ([place]) 04/21/2017  . Hip fracture, unspecified laterality, closed, initial encounter ([place]) 04/17/2017  . Hyperglycemia 04/17/2017  . Protein-calorie malnutrition, severe 04/09/2016  . Cavitary lesion of lung 04/06/2016    Past Surgical History:  Procedure Laterality Date  . ESOPHAGOGASTRODUODENOSCOPY N/A 02/03/2019   Procedure: ESOPHAGOGASTRODUODENOSCOPY (EGD);  Surgeon: Toledo, Benay Pike, MD;  Location: [facility] ENDOSCOPY;  Service:  Gastroenterology;  Laterality: N/A;  . NO PAST SURGERIES      Prior to Admission medications   Medication Sig Start Date End Date Taking? Authorizing Provider  aspirin EC 81 MG tablet Take 81 mg by mouth daily.   Yes [provider]  insulin NPH-regular Human (70-30) 100 UNIT/ML injection Inject 15 Units into the skin 2 (two) times daily with a meal. 25 units with breakfast in am and 35 units with supper 04/01/19  Yes [name], MD  pantoprazole (PROTONIX) 40 MG tablet Take 1 tablet (40 mg total) by mouth daily. 03/07/19  Yes Bettey Costa, MD  vancomycin (VANCOCIN HCL) 125 MG capsule Take 1 capsule (125 mg total) by mouth 4 (four) times daily for 8 days. 04/01/19 04/09/19 Yes [name], MD    Allergies Patient has no known allergies.  Family History  Problem Relation Age of Onset  . Diabetes Mother   . Diabetes Father     Social History Social History   Tobacco Use  . Smoking status: Never Smoker  . Smokeless tobacco: Never Used  Substance Use Topics  . Alcohol use: No  . Drug use: No    Review of Systems  Constitutional: No fever/chills Eyes: No visual changes. ENT: No sore throat. Cardiovascular: Denies chest pain. Respiratory: Denies shortness of breath. Gastrointestinal: Positive for abdominal pain, nausea, vomiting and diarrhea.  No constipation. Genitourinary: Negative for dysuria. Musculoskeletal: Negative for back pain. Skin: Negative for rash. Neurological: Negative for headaches, focal weakness or numbness.   ____________________________________________   PHYSICAL EXAM:  VITAL SIGNS: ED Triage Vitals [04/01/19 2203]  Enc Vitals Group     BP [name])  71/49     Pulse Rate (!) 104     Resp 18     Temp 98.7 F (37.1 C)     Temp Source Oral     SpO2 100 %     Weight 130 lb (59 kg)     Height 5\' 8"  (1.727 m)     Head Circumference      Peak Flow      Pain Score 6     Pain Loc      Pain Edu?      Excl. in [place]?      Constitutional: Alert and oriented.  Cachectic appearing and in no acute distress. Eyes: Conjunctivae are normal. PERRL. EOMI. Head: Atraumatic. Nose: No congestion/rhinnorhea. Mouth/Throat: Mucous membranes are mildly dry.  Oropharynx non-erythematous. Neck: No stridor.   Cardiovascular: Normal rate, regular rhythm. Grossly normal heart sounds.  Good peripheral circulation. Respiratory: Normal respiratory effort.  No retractions. Lungs CTAB. Gastrointestinal: Soft and nontender to light or deep palpation. No distention. No abdominal bruits. No CVA tenderness. Musculoskeletal: No lower extremity tenderness nor edema.  No joint effusions. Neurologic:  Normal speech and language. No gross focal neurologic deficits are appreciated. No gait instability.  Ambulates with a cane. Skin:  Skin is warm, dry and intact. No rash noted. Psychiatric: Mood and affect are normal. Speech and behavior are normal.  ____________________________________________   LABS (all labs ordered are listed, but only abnormal results are displayed)  Labs Reviewed  COMPREHENSIVE METABOLIC PANEL - Abnormal; Notable for the following components:      Result Value   Chloride 117 (*)    CO2 12 (*)    Glucose, Bld 172 (*)    BUN 39 (*)    Creatinine, Ser 2.98 (*)    Calcium 8.8 (*)    ALT 51 (*)    GFR calc non Af Amer 25 (*)    GFR calc Af Amer 28 (*)    All other components within normal limits  CBC - Abnormal; Notable for the following components:   RBC 2.84 (*)    Hemoglobin 8.6 (*)    HCT 27.4 (*)    All other components within normal limits  GLUCOSE, CAPILLARY - Abnormal; Notable for the following components:   Glucose-Capillary 159 (*)    All other components within normal limits  ETHANOL  CK  URINALYSIS, COMPLETE (UACMP) WITH MICROSCOPIC  URINE DRUG SCREEN, QUALITATIVE ([facility] ONLY)   ____________________________________________  EKG  ED ECG REPORT I, Harmoni Lucus J, the attending physician,  personally viewed and interpreted this ECG.   Date: 04/02/2019  EKG Time: 2215  Rate: 77  Rhythm: normal EKG, normal sinus rhythm  Axis: Normal  Intervals:none  ST&T Change: Nonspecific  ____________________________________________  RADIOLOGY  ED MD interpretation: None; Reviewed CT scan from 6/9  Official radiology report(s): No results found.  ____________________________________________   PROCEDURES  Procedure(s) performed (including Critical Care):  Procedures   ____________________________________________   INITIAL IMPRESSION / ASSESSMENT AND PLAN / ED COURSE  As part of my medical decision making, I reviewed the following data within the [place] notes reviewed and incorporated, Labs reviewed, Old chart reviewed, Radiograph reviewed and Notes from prior ED visits     Imad Shostak was evaluated in Emergency Department on 04/02/2019 for the symptoms described in the history of present illness. He was evaluated in the context of the global COVID-19 pandemic, which necessitated consideration that the patient might be at risk for infection with the SARS-CoV-2  virus that causes COVID-19. Institutional protocols and algorithms that pertain to the evaluation of patients at risk for COVID-19 are in a state of rapid change based on information released by regulatory bodies including the CDC and federal and state organizations. These policies and algorithms were followed during the patient's care in the ED.   43 year old diabetic male who returns to the ED for continued vomiting and diarrhea approximately 12 hours status post discharge from the hospital. Differential diagnosis includes, but is not limited to, acute appendicitis, renal colic, testicular torsion, urinary tract infection/pyelonephritis, prostatitis,  epididymitis, diverticulitis, small bowel obstruction or ileus, colitis, abdominal aortic aneurysm, gastroenteritis, hernia, etc.   Laboratory results remarkable for stable anemia, worsening BUN/creatinine secondary to dehydration.  Will initiate IV fluid resuscitation, Zofran.  Consider Reglan if patient continues to complain of nausea or vomiting.  Currently patient is already feeling better, asking for a second blanket so he can take a nap.  Patient denies chest pain; my interpretation of EKG is not consistent with acute pericarditis.   Clinical Course as of Apr 02 115  Mon Apr 02, 2019  0026 Orthostatics performed after 1 L IV fluids and noted.  Patient was dizzy upon standing.  Will discuss with Dr. Sidney Ace from hospitalist services to evaluate patient in the emergency department for admission.   [JS]    Clinical Course User Index [JS] Paulette Blanch, MD     ____________________________________________   FINAL CLINICAL IMPRESSION(S) / ED DIAGNOSES  Final diagnoses:  Dehydration  Nausea vomiting and diarrhea  Gastroparesis  Orthostatic hypotension     ED Discharge Orders    None       Note:  This document was prepared using Dragon voice recognition software and may include unintentional dictation errors.   Paulette Blanch, MD 04/02/19 [phone]

## 2019-04-01 NOTE — Plan of Care (Signed)
Patient VSS through the night. BG were controlled overnight. No hypoglycemic episodes. No complaints of pain. Will continue to monitor.   Problem: Education: Goal: Ability to describe self-care measures that may prevent or decrease complications (Diabetes Survival Skills Education) will improve Outcome: Progressing Goal: Individualized Educational Video(s) Outcome: Progressing   Problem: Coping: Goal: Ability to adjust to condition or change in health will improve Outcome: Progressing   Problem: Fluid Volume: Goal: Ability to maintain a balanced intake and output will improve Outcome: Progressing   Problem: Health Behavior/Discharge Planning: Goal: Ability to identify and utilize available resources and services will improve Outcome: Progressing Goal: Ability to manage health-related needs will improve Outcome: Progressing   Problem: Metabolic: Goal: Ability to maintain appropriate glucose levels will improve Outcome: Progressing   Problem: Nutritional: Goal: Maintenance of adequate nutrition will improve Outcome: Progressing   Problem: Skin Integrity: Goal: Risk for impaired skin integrity will decrease Outcome: Progressing   Problem: Tissue Perfusion: Goal: Adequacy of tissue perfusion will improve Outcome: Progressing

## 2019-04-01 NOTE — Progress Notes (Signed)
Discharge instructions and prescriptions given to patient with verbalization of understanding. Time allowed for questions. IV discontinued with difficulty.

## 2019-04-01 NOTE — ED Triage Notes (Signed)
Pt with vomiting and diarrhea that began today. Pt is hypotensive and unsteady in triage. Pt denies medical problems. Pt states he has lower abd pain.

## 2019-04-01 NOTE — ED Notes (Signed)
Patient seen here earlier today for vomiting. Returns for the same. States he is no better

## 2019-04-02 ENCOUNTER — Encounter: Payer: Self-pay | Admitting: *Deleted

## 2019-04-02 DIAGNOSIS — R197 Diarrhea, unspecified: Secondary | ICD-10-CM

## 2019-04-02 DIAGNOSIS — R112 Nausea with vomiting, unspecified: Secondary | ICD-10-CM

## 2019-04-02 LAB — BASIC METABOLIC PANEL
Anion gap: 5 (ref 5–15)
Anion gap: 9 (ref 5–15)
BUN: 40 mg/dL — ABNORMAL HIGH (ref 6–20)
BUN: 34 mg/dL — ABNORMAL HIGH (ref 6–20)
CO2: 14 mmol/L — ABNORMAL LOW (ref 22–32)
CO2: 13 mmol/L — ABNORMAL LOW (ref 22–32)
Calcium: 8.7 mg/dL — ABNORMAL LOW (ref 8.9–10.3)
Calcium: 8.1 mg/dL — ABNORMAL LOW (ref 8.9–10.3)
Chloride: 120 mmol/L — ABNORMAL HIGH (ref 98–111)
Chloride: 111 mmol/L (ref 98–111)
Creatinine, Ser: 2.59 mg/dL — ABNORMAL HIGH (ref 0.61–1.24)
Creatinine, Ser: 2.25 mg/dL — ABNORMAL HIGH (ref 0.61–1.24)
GFR calc Af Amer: 34 mL/min — ABNORMAL LOW (ref >60)
GFR calc Af Amer: 40 mL/min — ABNORMAL LOW (ref >60)
GFR calc non Af Amer: 29 mL/min — ABNORMAL LOW (ref >60)
GFR calc non Af Amer: 34 mL/min — ABNORMAL LOW (ref >60)
Glucose, Bld: 196 mg/dL — ABNORMAL HIGH (ref 70–99)
Glucose, Bld: 218 mg/dL — ABNORMAL HIGH (ref 70–99)
Potassium: 5 mmol/L (ref 3.5–5.1)
Potassium: 4.5 mmol/L (ref 3.5–5.1)
Sodium: 139 mmol/L (ref 135–145)
Sodium: 133 mmol/L — ABNORMAL LOW (ref 135–145)

## 2019-04-02 LAB — URINALYSIS, COMPLETE (UACMP) WITH MICROSCOPIC
Bilirubin Urine: NEGATIVE
Glucose, UA: 50 mg/dL — AB
Ketones, ur: NEGATIVE mg/dL
Leukocytes,Ua: NEGATIVE
Nitrite: NEGATIVE
Protein, ur: NEGATIVE mg/dL
Specific Gravity, Urine: 1.012 (ref 1.005–1.030)
pH: 5 (ref 5.0–8.0)

## 2019-04-02 LAB — HEMOGLOBIN A1C
Hgb A1c MFr Bld: 8.1 % — ABNORMAL HIGH (ref 4.8–5.6)
Mean Plasma Glucose: 186 mg/dL

## 2019-04-02 LAB — URINE DRUG SCREEN, QUALITATIVE (ARMC ONLY)
Amphetamines, Ur Screen: NOT DETECTED
Barbiturates, Ur Screen: NOT DETECTED
Benzodiazepine, Ur Scrn: NOT DETECTED
Cannabinoid 50 Ng, Ur ~~LOC~~: NOT DETECTED
Cocaine Metabolite,Ur ~~LOC~~: NOT DETECTED
MDMA (Ecstasy)Ur Screen: NOT DETECTED
Methadone Scn, Ur: NOT DETECTED
Opiate, Ur Screen: NOT DETECTED
Phencyclidine (PCP) Ur S: NOT DETECTED
Tricyclic, Ur Screen: NOT DETECTED

## 2019-04-02 LAB — CBC
HCT: 28 % — ABNORMAL LOW (ref 39.0–52.0)
Hemoglobin: 8.9 g/dL — ABNORMAL LOW (ref 13.0–17.0)
MCH: 30.6 pg (ref 26.0–34.0)
MCHC: 31.8 g/dL (ref 30.0–36.0)
MCV: 96.2 fL (ref 80.0–100.0)
Platelets: 295 10*3/uL (ref 150–400)
RBC: 2.91 MIL/uL — ABNORMAL LOW (ref 4.22–5.81)
RDW: 14.3 % (ref 11.5–15.5)
WBC: 8.1 10*3/uL (ref 4.0–10.5)
nRBC: 0 % (ref 0.0–0.2)

## 2019-04-02 LAB — URINE CULTURE: Special Requests: NORMAL

## 2019-04-02 LAB — GLUCOSE, CAPILLARY
Glucose-Capillary: 157 mg/dL — ABNORMAL HIGH (ref 70–99)
Glucose-Capillary: 197 mg/dL — ABNORMAL HIGH (ref 70–99)
Glucose-Capillary: 160 mg/dL — ABNORMAL HIGH (ref 70–99)

## 2019-04-02 MED ORDER — PANTOPRAZOLE SODIUM 40 MG IV SOLR
40.0000 mg | Freq: Two times a day (BID) | INTRAVENOUS | Status: DC
  Administered 2019-04-02 – 2019-04-03 (×4): 40 mg via INTRAVENOUS
  Filled 2019-04-02 (×3): qty 40

## 2019-04-02 MED ORDER — VANCOMYCIN 50 MG/ML ORAL SOLUTION
125.0000 mg | Freq: Four times a day (QID) | ORAL | Status: DC
  Administered 2019-04-02 – 2019-04-03 (×5): 125 mg via ORAL
  Filled 2019-04-02 (×6): qty 2.5

## 2019-04-02 MED ORDER — TRAZODONE HCL 50 MG PO TABS: 25.0000 mg | ORAL_TABLET | Freq: Every evening | ORAL | Status: DC | PRN

## 2019-04-02 MED ORDER — ENOXAPARIN SODIUM 40 MG/0.4ML ~~LOC~~ SOLN
40.0000 mg | SUBCUTANEOUS | Status: DC
  Administered 2019-04-03: 40 mg via SUBCUTANEOUS
  Filled 2019-04-02: qty 0.4

## 2019-04-02 MED ORDER — ONDANSETRON HCL 4 MG/2ML IJ SOLN: 4.0000 mg | Freq: Four times a day (QID) | INTRAMUSCULAR | Status: DC | PRN

## 2019-04-02 MED ORDER — INSULIN ASPART 100 UNIT/ML ~~LOC~~ SOLN
0.0000 [IU] | Freq: Three times a day (TID) | SUBCUTANEOUS | Status: DC
  Administered 2019-04-02 – 2019-04-03 (×3): 2 [IU] via SUBCUTANEOUS
  Administered 2019-04-03: 1 [IU] via SUBCUTANEOUS
  Filled 2019-04-02 (×4): qty 1

## 2019-04-02 MED ORDER — ACETAMINOPHEN 650 MG RE SUPP: 650.0000 mg | Freq: Four times a day (QID) | RECTAL | Status: DC | PRN

## 2019-04-02 MED ORDER — METOCLOPRAMIDE HCL 5 MG/ML IJ SOLN: 10.0000 mg | Freq: Three times a day (TID) | INTRAMUSCULAR | Status: DC

## 2019-04-02 MED ORDER — INSULIN ASPART PROT & ASPART (70-30 MIX) 100 UNIT/ML ~~LOC~~ SUSP
25.0000 [IU] | Freq: Two times a day (BID) | SUBCUTANEOUS | Status: DC
  Administered 2019-04-02 – 2019-04-03 (×3): 25 [IU] via SUBCUTANEOUS
  Filled 2019-04-02 (×3): qty 10

## 2019-04-02 MED ORDER — INSULIN ASPART 100 UNIT/ML ~~LOC~~ SOLN: 0.0000 [IU] | Freq: Every day | SUBCUTANEOUS | Status: DC

## 2019-04-02 MED ORDER — MAGNESIUM HYDROXIDE 400 MG/5ML PO SUSP: 30.0000 mL | Freq: Every day | ORAL | Status: DC | PRN

## 2019-04-02 MED ORDER — ACETAMINOPHEN 325 MG PO TABS: 650.0000 mg | ORAL_TABLET | Freq: Four times a day (QID) | ORAL | Status: DC | PRN

## 2019-04-02 MED ORDER — ASPIRIN EC 81 MG PO TBEC
81.0000 mg | DELAYED_RELEASE_TABLET | Freq: Every day | ORAL | Status: DC
  Administered 2019-04-02 – 2019-04-03 (×2): 81 mg via ORAL
  Filled 2019-04-02 (×2): qty 1

## 2019-04-02 MED ORDER — ENOXAPARIN SODIUM 40 MG/0.4ML ~~LOC~~ SOLN: 40.0000 mg | SUBCUTANEOUS | Status: DC

## 2019-04-02 MED ORDER — BACITRACIN-NEOMYCIN-POLYMYXIN OINTMENT TUBE
TOPICAL_OINTMENT | Freq: Three times a day (TID) | CUTANEOUS | Status: DC
  Administered 2019-04-02 (×3): via TOPICAL
  Administered 2019-04-03: 1 via TOPICAL
  Filled 2019-04-02: qty 14.17

## 2019-04-02 MED ORDER — SODIUM CHLORIDE 0.9 % IV SOLN
INTRAVENOUS | Status: DC
  Administered 2019-04-02: 05:00:00 via INTRAVENOUS

## 2019-04-02 MED ORDER — ONDANSETRON HCL 4 MG PO TABS: 4.0000 mg | ORAL_TABLET | Freq: Four times a day (QID) | ORAL | Status: DC | PRN

## 2019-04-02 MED ORDER — VANCOMYCIN HCL 125 MG PO CAPS
125.0000 mg | ORAL_CAPSULE | Freq: Four times a day (QID) | ORAL | Status: DC
  Filled 2019-04-02 (×2): qty 1

## 2019-04-02 MED ORDER — METOCLOPRAMIDE HCL 5 MG/ML IJ SOLN
10.0000 mg | Freq: Three times a day (TID) | INTRAMUSCULAR | Status: DC
  Administered 2019-04-02 – 2019-04-03 (×5): 10 mg via INTRAVENOUS
  Filled 2019-04-02 (×5): qty 2

## 2019-04-02 MED ORDER — ENOXAPARIN SODIUM 40 MG/0.4ML ~~LOC~~ SOLN
30.0000 mg | SUBCUTANEOUS | Status: DC
  Administered 2019-04-02: 30 mg via SUBCUTANEOUS
  Filled 2019-04-02: qty 0.4

## 2019-04-02 MED ORDER — SODIUM CHLORIDE 0.45 % IV SOLN
INTRAVENOUS | Status: DC
  Administered 2019-04-02 – 2019-04-03 (×3): via INTRAVENOUS

## 2019-04-02 NOTE — Progress Notes (Signed)
[place] at [place] NAME: Gregory Moody    MR#:  [id number]  DATE OF BIRTH:  1975-12-18  SUBJECTIVE:  CHIEF COMPLAINT:   Chief Complaint  Patient presents with  . Emesis   Discharged yesterday- came back again with nausea and vomit. Now no more nausea and vomit.  Still have 2-3 episodes of diarrhea. REVIEW OF SYSTEMS:   CONSTITUTIONAL: No fever, fatigue or weakness.  EYES: No blurred or double vision.  EARS, NOSE, AND THROAT: No tinnitus or ear pain.  RESPIRATORY: No cough, shortness of breath, wheezing or hemoptysis.  CARDIOVASCULAR: No chest pain, orthopnea, edema.  GASTROINTESTINAL: have nausea, vomiting, diarrhea, no abdominal pain.  GENITOURINARY: No dysuria, hematuria.  ENDOCRINE: No polyuria, nocturia,  HEMATOLOGY: No anemia, easy bruising or bleeding SKIN: No rash or lesion. MUSCULOSKELETAL: No joint pain or arthritis.   NEUROLOGIC: No tingling, numbness, weakness.  PSYCHIATRY: No anxiety or depression.   ROS  DRUG ALLERGIES:  No Known Allergies  VITALS:  Blood pressure 120/83, pulse 71, temperature 98.2 F (36.8 C), temperature source Oral, resp. rate 17, height 5\' 8"  (1.727 m), weight 59 kg, SpO2 100 %.  PHYSICAL EXAMINATION:  GENERAL:  43 y.o.-year-old patient lying in the bed with no acute distress.  EYES: Pupils equal, round, reactive to light and accommodation. No scleral icterus. Extraocular muscles intact.  HEENT: Head atraumatic, normocephalic. Oropharynx and nasopharynx clear.  NECK:  Supple, no jugular venous distention. No thyroid enlargement, no tenderness.  LUNGS: Normal breath sounds bilaterally, no wheezing, rales,rhonchi or crepitation. No use of accessory muscles of respiration.  CARDIOVASCULAR: S1, S2 normal. No murmurs, rubs, or gallops.  ABDOMEN: Soft, nontender, nondistended. Bowel sounds present. No organomegaly or mass.  EXTREMITIES: No pedal edema, cyanosis, or clubbing.  NEUROLOGIC:  Cranial nerves II through XII are intact. Muscle strength 5/5 in all extremities. Sensation intact. Gait not checked.  PSYCHIATRIC: The patient is alert and oriented x 3.  SKIN: On left knee there is chronic looking ulcer with some slough at the base but no redness surrounding.  Physical Exam LABORATORY PANEL:   CBC Recent Labs  Lab 04/02/19 0508  WBC 8.1  HGB 8.9*  HCT 28.0*  PLT 295   ------------------------------------------------------------------------------------------------------------------  Chemistries  Recent Labs  Lab 04/01/19 2210  04/02/19 1433  NA 136   < > 133*  K 4.8   < > 4.5  CL 117*   < > 111  CO2 12*   < > 13*  GLUCOSE 172*   < > 218*  BUN 39*   < > 34*  CREATININE 2.98*   < > 2.25*  CALCIUM 8.8*   < > 8.1*  AST 26  --   --   ALT 51*  --   --   ALKPHOS 117  --   --   BILITOT 0.4  --   --    < > = values in this interval not displayed.   ------------------------------------------------------------------------------------------------------------------  Cardiac Enzymes No results for input(s): TROPONINI in the last 168 hours. ------------------------------------------------------------------------------------------------------------------  RADIOLOGY:  No results found.  ASSESSMENT AND PLAN:   Active Problems:   Nausea and vomiting   Nausea vomiting and diarrhea  *Intractable nausea and vomiting Due to diabetic gastroparesis Give Zofran and Reglan. Advance diet as he can tolerate. Continue PPI.  Get GI consult.  *Acute renal failure on CKD stage III IV fluid hydration.  Monitor renal function.  *Type 2 diabetes with renal failure Continue insulin 70/30  and sliding scale.  *C. difficile diarrhea Continue treat with oral vancomycin.  *Metabolic acidosis This could be due to uncontrolled diabetes and infection. Continue to monitor with IV fluids.  *Gastroesophageal reflux disease with esophagitis in the past Continue PPI.  *Ulcer  on left knee Continue local antibiotic for application due to C. difficile.     All the records are reviewed and case discussed with Care Management/Social Workerr. Management plans discussed with the patient, family and they are in agreement.  CODE STATUS: Full code.  TOTAL TIME TAKING CARE OF THIS PATIENT: 35 minutes.     POSSIBLE D/C IN 1-2 DAYS, DEPENDING ON CLINICAL CONDITION.   [name] M.D on 04/02/2019   Between 7am to 6pm - Pager - [phone]  After 6pm go to [email or web address] - password [email or web address] [employer] [facility]  Office  [phone]  CC: Primary care physician; [place]  Note: This dictation was prepared with Dragon dictation along with smaller phrase technology. Any transcriptional errors that result from this process are unintentional.

## 2019-04-02 NOTE — H&P (Addendum)
.  He will be provided.  [place] at [place] NAME: Gregory Moody    MR#:  [id number]  DATE OF BIRTH:  [birth year]  DATE OF ADMISSION:  04/01/2019  PRIMARY CARE PHYSICIAN: [place]   REQUESTING/REFERRING PHYSICIAN: Lurline Hare, MD  CHIEF COMPLAINT:   Chief Complaint  Patient presents with  . Emesis    HISTORY OF PRESENT ILLNESS:  Gregory Moody  is a 43 y.o. [name] with a known history of diabetes mellitus with  diabetic gastroparesis, status post discharge yesterday, for hypoglycemia and left knee diabetic wound and acute kidney injury secondary to dehydration as well as C. difficile diarrhea for which she was started on p.o. vancomycin, who presented emergency room with acute onset of intractable nausea and vomiting with associated dizziness and hypotension.  He denied any fever or chills or abdominal pain or melena or bright red bleeding per rectum.  No bilious vomitus or hematemesis.  No dysuria, oliguria or hematuria or flank pain.  Upon presentation to the emergency room, vital signs revealed a blood pressure of 71/49 with a pulse of 104 with otherwise normal values.  Labs revealed a BUN of 39 and a creatinine of 2.98 above previous levels the same morning with a CK 193 and hemoglobin of 8.6 hematocrit of 27.4 compared to 8.1 and 25 on 03/31/2019.  Alcohol levels less than 10.  He was given Zofran 4 mg and 1 L of IV normal saline.  He will be admitted to an observation medical bed for further evaluation and management. PAST MEDICAL HISTORY:   Past Medical History:  Diagnosis Date  . Diabetes mellitus without complication ([place])   . Gastroparesis   . Tuberculosis     PAST SURGICAL HISTORY:   Past Surgical History:  Procedure Laterality Date  . ESOPHAGOGASTRODUODENOSCOPY N/A 02/03/2019   Procedure: ESOPHAGOGASTRODUODENOSCOPY (EGD);  Surgeon: Toledo, Benay Pike, MD;  Location: [facility] ENDOSCOPY;   Service: Gastroenterology;  Laterality: N/A;  . NO PAST SURGERIES      SOCIAL HISTORY:   Social History   Tobacco Use  . Smoking status: Never Smoker  . Smokeless tobacco: Never Used  Substance Use Topics  . Alcohol use: No    FAMILY HISTORY:   Family History  Problem Relation Age of Onset  . Diabetes Mother   . Diabetes Father     DRUG ALLERGIES:  No Known Allergies  REVIEW OF SYSTEMS:   ROS As per history of present illness. All pertinent systems were reviewed above. Constitutional,  HEENT, cardiovascular, respiratory, GI, GU, musculoskeletal, neuro, psychiatric, endocrine,  integumentary and hematologic systems were reviewed and are otherwise  negative/unremarkable except for positive findings mentioned above in the HPI.   MEDICATIONS AT HOME:   Prior to Admission medications   Medication Sig Start Date End Date Taking? Authorizing Provider  aspirin EC 81 MG tablet Take 81 mg by mouth daily.   Yes [provider]  insulin NPH-regular Human (70-30) 100 UNIT/ML injection Inject 15 Units into the skin 2 (two) times daily with a meal. 25 units with breakfast in am and 35 units with supper 04/01/19  Yes [name], MD  pantoprazole (PROTONIX) 40 MG tablet Take 1 tablet (40 mg total) by mouth daily. 03/07/19  Yes Bettey Costa, MD  vancomycin (VANCOCIN HCL) 125 MG capsule Take 1 capsule (125 mg total) by mouth 4 (four) times daily for 8 days. 04/01/19 04/09/19 Yes [name], MD  VITAL SIGNS:  Blood pressure 101/70, pulse 80, temperature 98.7 F (37.1 C), temperature source Oral, resp. rate 14, height 5\' 8"  (1.727 m), weight 59 kg, SpO2 100 %.  PHYSICAL EXAMINATION:  Physical Exam  GENERAL:  43 y.o.-year-old Hispanic patient lying in the bed with no acute distress.  EYES: Pupils equal, round, reactive to light and accommodation. No scleral icterus. Extraocular muscles intact.  HEENT: Head atraumatic, normocephalic. Oropharynx and  nasopharynx clear.  NECK:  Supple, no jugular venous distention. No thyroid enlargement, no tenderness.  LUNGS: Normal breath sounds bilaterally, no wheezing, rales,rhonchi or crepitation. No use of accessory muscles of respiration.  CARDIOVASCULAR: Regular rate and rhythm, S1, S2 normal. No murmurs, rubs, or gallops.  ABDOMEN: Soft, nondistended, nontender. Bowel sounds present. No organomegaly or mass.  EXTREMITIES: No pedal edema, cyanosis, or clubbing.  NEUROLOGIC: Cranial nerves II through XII are intact. Muscle strength 5/5 in all extremities. Sensation intact. Gait not checked.  PSYCHIATRIC: The patient is alert and oriented x 3.  Normal affect and good eye contact. SKIN: No obvious rash, lesion, or ulcer.   LABORATORY PANEL:   CBC Recent Labs  Lab 04/01/19 2210  WBC 7.7  HGB 8.6*  HCT 27.4*  PLT 307   ------------------------------------------------------------------------------------------------------------------  Chemistries  Recent Labs  Lab 04/01/19 2210  NA 136  K 4.8  CL 117*  CO2 12*  GLUCOSE 172*  BUN 39*  CREATININE 2.98*  CALCIUM 8.8*  AST 26  ALT 51*  ALKPHOS 117  BILITOT 0.4   ------------------------------------------------------------------------------------------------------------------  Cardiac Enzymes No results for input(s): TROPONINI in the last 168 hours. ------------------------------------------------------------------------------------------------------------------  RADIOLOGY:  No results found.    IMPRESSION AND PLAN:   1.  Intractable nausea and vomiting likely associated with diabetic gastroparesis.  Patient will be admitted to an observation medically monitored bed.  He will be hydrated with IV normal saline.  We will place him on scheduled IV Reglan and PRN IV Zofran.  We will place him on clear liquid and advance diet.  We will place him on IV PPI therapy.  2.  Acute kidney injury on top of stage III chronic kidney disease.   He will be hydrated with IV normal saline and will follow his BMP.  He has associated anemia of chronic kidney disease, with better hemoglobin hematocrit.  3.  Type 2 diabetes mellitus.  We will continue his basal coverage and place him on supplemental coverage with NovoLog.  4.  Recent C. difficile diarrhea.  We will continue his p.o. vancomycin  5. GERD.  PPI therapy will be resumed.  6.  DVT prophylaxis.  Subcutaneous Lovenox   All the records are reviewed and case discussed with ED provider. The plan of care was discussed in details with the patient (and family). I answered all questions. The patient agreed to proceed with the above mentioned plan. Further management will depend upon hospital course.   CODE STATUS: Full code  TOTAL TIME TAKING CARE OF THIS PATIENT: 50 minutes.    Christel Mormon M.D on 04/02/2019 at 3:27 AM  Pager - [phone]  After 6pm go to [email or web address] - [employer] [place] [facility]  Office  [phone]  CC: Primary care physician; [place]   Note: This dictation was prepared with Dragon dictation along with smaller Company secretary. Any transcriptional errors that result from this process are unintentional.

## 2019-04-02 NOTE — ED Notes (Signed)
Pt refusing to keep pulse ox or ekg leads on at this time. MD aware. Pt also refusing to provide urine sample at this time. MD Beather Arbour aware. MD Beather Arbour does not wish to cath pt at this time for urine sample.

## 2019-04-02 NOTE — Progress Notes (Signed)
Anticoagulation monitoring(Lovenox):  43yo  M ordered Lovenox 30 mg Q24h  Filed Weights   04/01/19 2203  Weight: 130 lb (59 kg)   BMI 19.77   Lab Results  Component Value Date   CREATININE 2.59 (H) 04/02/2019   CREATININE 2.98 (H) 04/01/2019   CREATININE 2.06 (H) 04/01/2019   Estimated Creatinine Clearance: 30.7 mL/min (A) (by C-G formula based on SCr of 2.59 mg/dL (H)). Hemoglobin & Hematocrit     Component Value Date/Time   HGB 8.9 (L) 04/02/2019 0508   HCT 28.0 (L) 04/02/2019 9494     Per Protocol for Patient with estCrcl now > 30 ml/min and BMI < 40, will transition to Lovenox 40 mg Q24h      Chinita Greenland PharmD Clinical Pharmacist 04/02/2019

## 2019-04-02 NOTE — Progress Notes (Addendum)
Inpatient Diabetes Program Recommendations  AACE/ADA: New Consensus Statement on Inpatient Glycemic Control (2015)  Target Ranges:  Prepandial:   less than 140 mg/dL      Peak postprandial:   less than 180 mg/dL (1-2 hours)      Critically ill patients:  140 - 180 mg/dL   Results for CORRELL, DENBOW (MRN [id number]) as of 04/02/2019 08:42  Ref. Range 04/01/2019 00:52 04/01/2019 04:13 04/01/2019 07:55 04/01/2019 11:45 04/01/2019 22:18  Glucose-Capillary Latest Ref Range: 70 - 99 mg/dL 143 (H) 150 (H) 140 (H) 270 (H) 159 (H)    Admit with: Vomiting (Discharged 06/21 after admission for Hypoglycemia and L Knee Wound)--Back to ED on 06/21 at 10pm for vomiting  History: DM, Gastroparesis  Home DM Meds: 70/30 Insulin- 25 units AM/ 35 units PM  Current Orders:  70/30 Insulin- 25 units BID   Allowed Clear Liquid diet this AM.     MD- Please add Novolog Sensitive Correction Scale/ SSI (0-9 units) Q4 hours     --Will follow patient during hospitalization--  Wyn Quaker RN, MSN, CDE Diabetes Coordinator Inpatient Glycemic Control Team Team Pager: [phone] (8a-5p)

## 2019-04-02 NOTE — Consult Note (Signed)
Lucilla Lame, MD [facility]  [address], [place], [place] [address] Phone: [phone] Fax : [phone]  Consultation  Referring Provider:     Dr.  Anselm Jungling Primary Care Physician:  [place] Primary Gastroenterologist:  Dr. Alice Reichert         Reason for Consultation:     Nausea, vomiting and diarrhea  Date of Admission:  04/01/2019 Date of Consultation:  04/02/2019         HPI:   Gregory Moody is a 43 y.o. male who has a long history of nausea vomiting diarrhea with evaluation at [facility] in the past.  Patient's work-up dates all the way back to February 2018 with a recent EGD and April of this year by Dr. Alice Reichert with the findings of esophagitis.  The patient was in the hospital ER on 620 and sent home and then returned again yesterday with the same complaints of nausea vomiting diarrhea.  The patient states that the diarrhea is usually after he eats.  He states that it started after he was given medication for what he describes as a chest infection.  The patient had an EGD and colonoscopy in 2018 that did not show any cause for his symptoms.  The patient was presumed to have gastroparesis due to his longstanding diabetes.  After being discharged from the ER the patient came back the next day with the same complaints.  The patient was found to have a C. difficile antigen positive with negative toxin and the PCR was positive. I came to see the patient today and he was watching TV.  Shortly after I came into the room the phone rang and the patient got on the phone with dietary services and ordered food and stated that he was also thirsty and wanted to start eating.  The patient states that he is at his baseline at this time except for continued diarrhea.  He reports his diarrhea is mostly after he eats and if he only eats once a day will have only diarrhea once. The patient had hypotension on admission with tachycardia and an elevated creatinine and BUN.  The patient's  hemoglobin was 8.6 which was higher than when he was discharged previously.  The patient was treated with IV fluids and Zofran prior to admission.  Past Medical History:  Diagnosis Date  . Diabetes mellitus without complication ([place])   . Gastroparesis   . Tuberculosis     Past Surgical History:  Procedure Laterality Date  . ESOPHAGOGASTRODUODENOSCOPY N/A 02/03/2019   Procedure: ESOPHAGOGASTRODUODENOSCOPY (EGD);  Surgeon: Toledo, Benay Pike, MD;  Location: [facility] ENDOSCOPY;  Service: Gastroenterology;  Laterality: N/A;  . NO PAST SURGERIES      Prior to Admission medications   Medication Sig Start Date End Date Taking? Authorizing Provider  aspirin EC 81 MG tablet Take 81 mg by mouth daily.   Yes [provider]  insulin NPH-regular Human (70-30) 100 UNIT/ML injection Inject 15 Units into the skin 2 (two) times daily with a meal. 25 units with breakfast in am and 35 units with supper 04/01/19  Yes [name], MD  pantoprazole (PROTONIX) 40 MG tablet Take 1 tablet (40 mg total) by mouth daily. 03/07/19  Yes Bettey Costa, MD  vancomycin (VANCOCIN HCL) 125 MG capsule Take 1 capsule (125 mg total) by mouth 4 (four) times daily for 8 days. 04/01/19 04/09/19 Yes [name], MD    Family History  Problem Relation Age of Onset  . Diabetes Mother   .  Diabetes Father      Social History   Tobacco Use  . Smoking status: Never Smoker  . Smokeless tobacco: Never Used  Substance Use Topics  . Alcohol use: No  . Drug use: No    Allergies as of 04/01/2019  . (No Known Allergies)    Review of Systems:    All systems reviewed and negative except where noted in HPI.   Physical Exam:  Vital signs in last 24 hours: Temp:  [98 F (36.7 C)-98.7 F (37.1 C)] 98.2 F (36.8 C) (06/22 1202) Pulse Rate:  [71-104] 71 (06/22 1202) Resp:  [14-19] 17 (06/22 1202) BP: (71-123)/(49-84) 120/83 (06/22 1202) SpO2:  [100 %] 100 % (06/22 1202) Weight:  [59 kg] 59 kg (06/21  2203) Last BM Date: 04/02/19 General:   Pleasant, cooperative in NAD Head:  Normocephalic and atraumatic. Eyes:   No icterus.   Conjunctiva pink. PERRLA. Ears:  Normal auditory acuity. Neck:  Supple; no masses or thyroidomegaly Lungs: Respirations even and unlabored. Lungs clear to auscultation bilaterally.   No wheezes, crackles, or rhonchi.  Heart:  Regular rate and rhythm;  Without murmur, clicks, rubs or gallops Abdomen:  Soft, nondistended, nontender. Normal bowel sounds. No appreciable masses or hepatomegaly.  No rebound or guarding.  Rectal:  Not performed. Msk:  Symmetrical without gross deformities.    Extremities:  Without edema, cyanosis or clubbing. Neurologic:  Alert and oriented x3;  grossly normal neurologically. Skin:  Intact without significant lesions or rashes. Cervical Nodes:  No significant cervical adenopathy. Psych:  Alert and cooperative. Normal affect.  LAB RESULTS: Recent Labs    03/31/19 0535 04/01/19 2210 04/02/19 0508  WBC 5.3 7.7 8.1  HGB 8.1* 8.6* 8.9*  HCT 25.0* 27.4* 28.0*  PLT 251 307 295   BMET Recent Labs    04/01/19 1011 04/01/19 2210 04/02/19 0508  NA 135 136 139  K 5.2* 4.8 5.0  CL 114* 117* 120*  CO2 15* 12* 14*  GLUCOSE 307* 172* 196*  BUN 33* 39* 40*  CREATININE 2.06* 2.98* 2.59*  CALCIUM 8.6* 8.8* 8.7*   LFT Recent Labs    03/30/19 1914 04/01/19 2210  PROT 7.1 7.7  ALBUMIN 3.5 3.8  AST 47* 26  ALT 73* 51*  ALKPHOS 111 117  BILITOT 0.2* 0.4  BILIDIR <0.1  --   IBILI NOT CALCULATED  --    PT/INR No results for input(s): LABPROT, INR in the last 72 hours.  STUDIES: No results found.    Impression / Plan:   Assessment: Active Problems:   Nausea and vomiting Diarrhea  Gregory Moody is a 43 y.o. y/o male with a long history of nausea vomiting diarrhea with resumed gastroparesis a history of esophagitis and diarrhea.  The patient's diarrhea is usually postprandial and does not wake him up from a  sleep.  The patient was in the ER and sent home when he stated he was feeling better and then came back the following day and was admitted.  Plan:  The patient is sitting in bed stating he is hungry without any nausea vomiting fevers or chills.  The patient continues to have diarrhea and has had a work-up at [facility] with biopsies.  Patient now has C. difficile and is being treated for the C. difficile.  There is no need to repeat any of his endoscopic procedures at this time and I would treat him with a double dose PPI for his history of esophagitis with nausea and vomiting now.  The patient has been encouraged to follow-up with [facility] since he had his procedures there and has had a work-up at that time at that institution.  Nothing further to recommend from a GI point of view.  Thank you for involving me in the care of this patient.      LOS: 0 days   Lucilla Lame, MD  04/02/2019, 2:53 PM    Note: This dictation was prepared with Dragon dictation along with smaller phrase technology. Any transcriptional errors that result from this process are unintentional.

## 2019-04-02 NOTE — ED Notes (Signed)
.. ED TO INPATIENT HANDOFF REPORT  ED Nurse Name and Phone #: Delton Coombes  S Name/Age/Gender Gregory Moody 43 y.o. male Room/Bed: ED04A/ED04A  Code Status   Code Status: Full Code  Home/SNF/Other Home Patient oriented to: self, place, time and situation Is this baseline? Yes   Triage Complete: Triage complete  Chief Complaint Vomiting  Triage Note Pt with vomiting and diarrhea that began today. Pt is hypotensive and unsteady in triage. Pt denies medical problems. Pt states he has lower abd pain.    Allergies No Known Allergies  Level of Care/Admitting Diagnosis ED Disposition    ED Disposition Condition [facility]: [place] [[id number]]  Level of Care: Med-Surg [16]  Covid Evaluation: Screening Protocol (No Symptoms)  Diagnosis: Nausea and vomiting [829937]  Admitting Physician: Christel Mormon [[id number]]  Attending Physician: Christel Mormon [[id number]]  PT Class (Do Not Modify): Observation [104]  PT Acc Code (Do Not Modify): Observation [10022]       B Medical/Surgery History Past Medical History:  Diagnosis Date  . Diabetes mellitus without complication ([place])   . Gastroparesis   . Tuberculosis    Past Surgical History:  Procedure Laterality Date  . ESOPHAGOGASTRODUODENOSCOPY N/A 02/03/2019   Procedure: ESOPHAGOGASTRODUODENOSCOPY (EGD);  Surgeon: Toledo, Benay Pike, MD;  Location: [facility] ENDOSCOPY;  Service: Gastroenterology;  Laterality: N/A;  . NO PAST SURGERIES       A IV Location/Drains/Wounds Patient Lines/Drains/Airways Status   Active Line/Drains/Airways    Name:   Placement date:   Placement time:   Site:   Days:   Peripheral IV 04/01/19 Right Hand   04/01/19    2208    Hand   1   Pressure Injury 03/31/19 Knee Anterior;Left Stage II -  Partial thickness loss of dermis presenting as a shallow open ulcer with a red, pink wound bed without slough.   03/31/19    0254     2          Intake/Output Last 24  hours  Intake/Output Summary (Last 24 hours) at 04/02/2019 0255 Last data filed at 04/02/2019 0254 Gross per 24 hour  Intake 1000 ml  Output -  Net 1000 ml    Labs/Imaging Results for orders placed or performed during the hospital encounter of 04/01/19 (from the past 48 hour(s))  Comprehensive metabolic panel     Status: Abnormal   Collection Time: 04/01/19 10:10 PM  Result Value Ref Range   Sodium 136 135 - 145 mmol/L   Potassium 4.8 3.5 - 5.1 mmol/L   Chloride 117 (H) 98 - 111 mmol/L   CO2 12 (L) 22 - 32 mmol/L   Glucose, Bld 172 (H) 70 - 99 mg/dL   BUN 39 (H) 6 - 20 mg/dL   Creatinine, Ser 2.98 (H) 0.61 - 1.24 mg/dL   Calcium 8.8 (L) 8.9 - 10.3 mg/dL   Total Protein 7.7 6.5 - 8.1 g/dL   Albumin 3.8 3.5 - 5.0 g/dL   AST 26 15 - 41 U/L   ALT 51 (H) 0 - 44 U/L   Alkaline Phosphatase 117 38 - 126 U/L   Total Bilirubin 0.4 0.3 - 1.2 mg/dL   GFR calc non Af Amer 25 (L) >60 mL/min   GFR calc Af Amer 28 (L) >60 mL/min   Anion gap 7 5 - 15    Comment: Performed at [facility], [address], [place],  [address]  CBC  Status: Abnormal   Collection Time: 04/01/19 10:10 PM  Result Value Ref Range   WBC 7.7 4.0 - 10.5 K/uL   RBC 2.84 (L) 4.22 - 5.81 MIL/uL   Hemoglobin 8.6 (L) 13.0 - 17.0 g/dL   HCT 27.4 (L) 39.0 - 52.0 %   MCV 96.5 80.0 - 100.0 fL   MCH 30.3 26.0 - 34.0 pg   MCHC 31.4 30.0 - 36.0 g/dL   RDW 14.4 11.5 - 15.5 %   Platelets 307 150 - 400 K/uL   nRBC 0.0 0.0 - 0.2 %    Comment: Performed at [facility], [address] [place], [place] [address]  Ethanol     Status: None   Collection Time: 04/01/19 10:10 PM  Result Value Ref Range   Alcohol, Ethyl (B) <10 <10 mg/dL    Comment: (NOTE) Lowest detectable limit for serum alcohol is 10 mg/dL. For medical purposes only. Performed at [facility], [address] [place], [place] [address]   CK     Status: None   Collection Time: 04/01/19 10:10 PM  Result Value Ref Range    Total CK 193 49 - 397 U/L    Comment: Performed at [facility], [address] [place],  [address]  Glucose, capillary     Status: Abnormal   Collection Time: 04/01/19 10:18 PM  Result Value Ref Range   Glucose-Capillary 159 (H) 70 - 99 mg/dL   Comment 1 Notify RN    Comment 2 Call MD NNP PA CNM    No results found.  Pending Labs Unresulted Labs (From admission, onward)    Start     Ordered   04/02/19 6333  Basic metabolic panel  Tomorrow morning,   STAT     04/02/19 0123   04/02/19 0500  CBC  Tomorrow morning,   STAT     04/02/19 0123   04/01/19 2311  Urine Drug Screen, Qualitative  Once,   STAT     04/01/19 2311   04/01/19 2209  Urinalysis, Complete w Microscopic  ONCE - STAT,   STAT     04/01/19 2208          Vitals/Pain Today's Vitals   04/02/19 0100 04/02/19 0130 04/02/19 0200 04/02/19 0230  BP: 94/70 92/68 97/65  101/70  Pulse:   80   Resp:   14   Temp:      TempSrc:      SpO2:   100%   Weight:      Height:      PainSc:        Isolation Precautions No active isolations  Medications Medications  aspirin EC tablet 81 mg (has no administration in time range)  vancomycin (VANCOCIN) 125 MG capsule 125 mg (has no administration in time range)  insulin aspart protamine- aspart (NOVOLOG MIX 70/30) injection 25 Units (has no administration in time range)  pantoprazole (PROTONIX) injection 40 mg (has no administration in time range)  metoCLOPramide (REGLAN) injection 10 mg (has no administration in time range)  enoxaparin (LOVENOX) injection 40 mg (40 mg Subcutaneous Refused 04/02/19 0217)  0.9 %  sodium chloride infusion (has no administration in time range)  acetaminophen (TYLENOL) tablet 650 mg (has no administration in time range)    Or  acetaminophen (TYLENOL) suppository 650 mg (has no administration in time range)  traZODone (DESYREL) tablet 25 mg (has no administration in time range)  magnesium hydroxide (MILK OF MAGNESIA) suspension 30  mL (has no administration in  time range)  ondansetron (ZOFRAN) tablet 4 mg (has no administration in time range)    Or  ondansetron (ZOFRAN) injection 4 mg (has no administration in time range)  metoCLOPramide (REGLAN) injection 10 mg (has no administration in time range)  sodium chloride 0.9 % bolus 1,000 mL (0 mLs Intravenous Stopped 04/02/19 0254)  ondansetron (ZOFRAN) injection 4 mg (4 mg Intravenous Given 04/01/19 2325)    Mobility walks Moderate fall risk   Focused Assessments Emesis   R Recommendations: See Admitting Provider Note  Report given to:   Additional Notes:

## 2019-04-03 LAB — BASIC METABOLIC PANEL
Anion gap: 10 (ref 5–15)
BUN: 30 mg/dL — ABNORMAL HIGH (ref 6–20)
CO2: 15 mmol/L — ABNORMAL LOW (ref 22–32)
Calcium: 8.2 mg/dL — ABNORMAL LOW (ref 8.9–10.3)
Chloride: 113 mmol/L — ABNORMAL HIGH (ref 98–111)
Creatinine, Ser: 2.1 mg/dL — ABNORMAL HIGH (ref 0.61–1.24)
GFR calc Af Amer: 43 mL/min — ABNORMAL LOW (ref >60)
GFR calc non Af Amer: 37 mL/min — ABNORMAL LOW (ref >60)
Glucose, Bld: 180 mg/dL — ABNORMAL HIGH (ref 70–99)
Potassium: 4.6 mmol/L (ref 3.5–5.1)
Sodium: 138 mmol/L (ref 135–145)

## 2019-04-03 LAB — NOVEL CORONAVIRUS, NAA (HOSP ORDER, SEND-OUT TO REF LAB; TAT 18-24 HRS): SARS-CoV-2, NAA: NOT DETECTED

## 2019-04-03 LAB — GLUCOSE, CAPILLARY
Glucose-Capillary: 146 mg/dL — ABNORMAL HIGH (ref 70–99)
Glucose-Capillary: 188 mg/dL — ABNORMAL HIGH (ref 70–99)

## 2019-04-03 MED ORDER — PANTOPRAZOLE SODIUM 40 MG PO TBEC: 40.0000 mg | DELAYED_RELEASE_TABLET | Freq: Two times a day (BID) | ORAL | 2 refills | Status: DC

## 2019-04-03 MED ORDER — ONDANSETRON HCL 4 MG PO TABS: 4.0000 mg | ORAL_TABLET | Freq: Four times a day (QID) | ORAL | 0 refills | Status: DC | PRN

## 2019-04-03 MED ORDER — PANTOPRAZOLE SODIUM 40 MG PO TBEC: 40.0000 mg | DELAYED_RELEASE_TABLET | Freq: Two times a day (BID) | ORAL | Status: DC

## 2019-04-03 MED ORDER — INSULIN NPH ISOPHANE & REGULAR (70-30) 100 UNIT/ML ~~LOC~~ SUSP: 20.0000 [IU] | Freq: Two times a day (BID) | SUBCUTANEOUS | 11 refills | Status: DC

## 2019-04-03 MED ORDER — MUPIROCIN 2 % EX OINT
TOPICAL_OINTMENT | Freq: Every day | CUTANEOUS | Status: DC
  Administered 2019-04-03: 12:00:00 via TOPICAL
  Filled 2019-04-03: qty 22

## 2019-04-03 NOTE — Progress Notes (Signed)
Gregory Moody to be D/C'd home per MD order.  Discussed prescriptions and follow up appointments with the patient. Prescriptions given to patient, medication list explained in detail. Pt verbalized understanding.  Allergies as of 04/03/2019   No Known Allergies     Medication List    TAKE these medications   aspirin EC 81 MG tablet Take 81 mg by mouth daily.   insulin NPH-regular Human (70-30) 100 UNIT/ML injection Inject 20 Units into the skin 2 (two) times daily with a meal. 25 units with breakfast in am and 35 units with supper What changed: how much to take   ondansetron 4 MG tablet Commonly known as: ZOFRAN Take 1 tablet (4 mg total) by mouth every 6 (six) hours as needed for nausea.   pantoprazole 40 MG tablet Commonly known as: PROTONIX Take 1 tablet (40 mg total) by mouth 2 (two) times daily before a meal. What changed: when to take this   vancomycin 125 MG capsule Commonly known as: Vancocin HCl Take 1 capsule (125 mg total) by mouth 4 (four) times daily for 8 days.       Vitals:   04/03/19 0650 04/03/19 1143  BP: 109/77 (!) 136/98  Pulse: 71 79  Resp:    Temp:  99 F (37.2 C)  SpO2:  100%    Skin clean, dry and intact without evidence of skin break down, no evidence of skin tears noted. IV catheter discontinued intact. Site without signs and symptoms of complications. Dressing and pressure applied. Pt denies pain at this time. No complaints noted.  An After Visit Summary was printed and given to the patient. Patient escorted via [place], and D/C home via private auto.  Chuck Hint RN [facility] [employer]

## 2019-04-03 NOTE — Discharge Summary (Signed)
[facility] [place] NAME: Itzael Liptak    MR#:  [id number]  DATE OF BIRTH:  September 24, 1976  DATE OF ADMISSION:  04/01/2019 ADMITTING PHYSICIAN: Christel Mormon, MD  DATE OF DISCHARGE: 04/03/2019   PRIMARY CARE PHYSICIAN: [place]    ADMISSION DIAGNOSIS:  Orthostatic hypotension [I95.1] Gastroparesis [K31.84] Dehydration [E86.0] Nausea vomiting and diarrhea [R11.2, R19.7]  DISCHARGE DIAGNOSIS:  Active Problems:   Nausea and vomiting   Nausea vomiting and diarrhea   SECONDARY DIAGNOSIS:   Past Medical History:  Diagnosis Date  . Diabetes mellitus without complication ([place])   . Gastroparesis   . Tuberculosis     HOSPITAL COURSE:   *Intractable nausea and vomiting Due to diabetic gastroparesis Give Zofran and Reglan. Advance diet as he can tolerate. Continue PPI.  Get GI consult. GI saw the patient, patient was feeling significantly better so suggested to continue PPI 2 times a day.  And treat for C. difficile.  *Acute renal failure on CKD stage III IV fluid hydration.  Monitor renal function.  Back to baseline.  *Type 2 diabetes with renal failure Continue insulin 70/30 and sliding scale.  *C. difficile diarrhea Continue treat with oral vancomycin.  *Metabolic acidosis This could be due to uncontrolled diabetes and infection. Continue to monitor with IV fluids. Slight improved.  *Gastroesophageal reflux disease with esophagitis in the past Continue PPI twice daily.  *Ulcer on left knee Continue local antibiotic for application due to C. difficile.   DISCHARGE CONDITIONS:   Stable.  CONSULTS OBTAINED:  Treatment Team:  Lucilla Lame, MD  DRUG ALLERGIES:  No Known Allergies  DISCHARGE MEDICATIONS:   Allergies as of 04/03/2019   No Known Allergies     Medication List    TAKE these medications   aspirin EC 81 MG tablet Take 81 mg by mouth daily.   insulin  NPH-regular Human (70-30) 100 UNIT/ML injection Inject 20 Units into the skin 2 (two) times daily with a meal. 25 units with breakfast in am and 35 units with supper What changed: how much to take   ondansetron 4 MG tablet Commonly known as: ZOFRAN Take 1 tablet (4 mg total) by mouth every 6 (six) hours as needed for nausea.   pantoprazole 40 MG tablet Commonly known as: PROTONIX Take 1 tablet (40 mg total) by mouth 2 (two) times daily before a meal. What changed: when to take this   vancomycin 125 MG capsule Commonly known as: Vancocin HCl Take 1 capsule (125 mg total) by mouth 4 (four) times daily for 8 days.        DISCHARGE INSTRUCTIONS:    Follow with primary care physician in 1 to 2 weeks.  If you experience worsening of your admission symptoms, develop shortness of breath, life threatening emergency, suicidal or homicidal thoughts you must seek medical attention immediately by calling 911 or calling your MD immediately  if symptoms less severe.  You Must read complete instructions/literature along with all the possible adverse reactions/side effects for all the Medicines you take and that have been prescribed to you. Take any new Medicines after you have completely understood and accept all the possible adverse reactions/side effects.   Please note  You were cared for by a hospitalist during your hospital stay. If you have any questions about your discharge medications or the care you received while you were in the hospital after you are discharged, you can call the unit and asked to speak  with the hospitalist on call if the hospitalist that took care of you is not available. Once you are discharged, your primary care physician will handle any further medical issues. Please note that NO REFILLS for any discharge medications will be authorized once you are discharged, as it is imperative that you return to your primary care physician (or establish a relationship with a primary  care physician if you do not have one) for your aftercare needs so that they can reassess your need for medications and monitor your lab values.    Today   CHIEF COMPLAINT:   Chief Complaint  Patient presents with  . Emesis    HISTORY OF PRESENT ILLNESS:  Esaiah Wanless  is a 43 y.o. male with a known history of diabetes mellitus, tuberculosis status post treatment, and gastroparesis.  He presented to the emergency room with hypoglycemia.  Patient reportedly had gone outside earlier in the day and lay down under the bushes.  He had been trimming bushes earlier in the day around his home.  He began feeling tired and laid down.  He was awakened by EMS services.  He feels that he was on the ground about 2 hours.  EMS reported glucose of 30 which was treated with D50.  Glucose on arrival to the emergency room was 113.  However patient was found to be hypothermic with temperature 94.7.  He also had 1 loose bowel movement in the emergency room.  Patient continued to be slightly lethargic however would not awaken to conversation.  Warming blanket was placed on the patient with his temperature increasing to 97.8.  He denies recent illness.  He denies fevers or chills.  He denies abdominal pain.  He denies chest pain, shortness of breath.  He denies nausea or vomiting.  Patient denies recent diarrhea however he was noted to have 1 loose bowel movement while in the emergency room.  No hematemesis, hematochezia, or melena according to the patient.  On my examination of the patient there is a 7 cm ulceration to his left knee which he reports has been there for several months.  He denies having had prior evaluation or treatment.  X-ray demonstrates surrounding edema however no evidence of osteomyelitis.  On arrival to the emergency room, creatinine is 2.43 with BUN 32.  Hemoglobin is 8.4 with hematocrit 25.5.  WBC is 5.5.  Glucose is 113.  Lactic acid is 1.7.  We have admitted him to the hospitalist  service for further management.   VITAL SIGNS:  Blood pressure (!) 136/98, pulse 79, temperature 99 F (37.2 C), temperature source Oral, resp. rate 18, height 5\' 8"  (1.727 m), weight 59 kg, SpO2 100 %.  I/O:    Intake/Output Summary (Last 24 hours) at 04/03/2019 1313 Last data filed at 04/03/2019 0857 Gross per 24 hour  Intake 1086.57 ml  Output -  Net 1086.57 ml    PHYSICAL EXAMINATION:  GENERAL:  43 y.o.-year-old patient lying in the bed with no acute distress.  EYES: Pupils equal, round, reactive to light and accommodation. No scleral icterus. Extraocular muscles intact.  HEENT: Head atraumatic, normocephalic. Oropharynx and nasopharynx clear.  NECK:  Supple, no jugular venous distention. No thyroid enlargement, no tenderness.  LUNGS: Normal breath sounds bilaterally, no wheezing, rales,rhonchi or crepitation. No use of accessory muscles of respiration.  CARDIOVASCULAR: S1, S2 normal. No murmurs, rubs, or gallops.  ABDOMEN: Soft, nontender, nondistended. Bowel sounds present. No organomegaly or mass.  EXTREMITIES: No pedal edema, cyanosis, or clubbing.  NEUROLOGIC: Cranial nerves II through XII are intact. Muscle strength 5/5 in all extremities. Sensation intact. Gait not checked.  PSYCHIATRIC: The patient is alert and oriented x 3.  SKIN: On left knee there is chronic looking ulcer with some slough at the base but no redness surrounding.  DATA REVIEW:   CBC Recent Labs  Lab 04/02/19 0508  WBC 8.1  HGB 8.9*  HCT 28.0*  PLT 295    Chemistries  Recent Labs  Lab 04/01/19 2210  04/03/19 0353  NA 136   < > 138  K 4.8   < > 4.6  CL 117*   < > 113*  CO2 12*   < > 15*  GLUCOSE 172*   < > 180*  BUN 39*   < > 30*  CREATININE 2.98*   < > 2.10*  CALCIUM 8.8*   < > 8.2*  AST 26  --   --   ALT 51*  --   --   ALKPHOS 117  --   --   BILITOT 0.4  --   --    < > = values in this interval not displayed.    Cardiac Enzymes No results for input(s): TROPONINI in the last  168 hours.  Microbiology Results  Results for orders placed or performed during the hospital encounter of 03/30/19  Culture, blood (Routine X 2) w Reflex to ID Panel     Status: None (Preliminary result)   Collection Time: 03/30/19 10:59 PM   Specimen: BLOOD  Result Value Ref Range Status   Specimen Description   Final    BLOOD Blood Culture results may not be optimal due to an excessive volume of blood received in culture bottles   Special Requests   Final    BOTTLES DRAWN AEROBIC AND ANAEROBIC LEFT ANTECUBITAL   Culture   Final    NO GROWTH 4 DAYS Performed at [facility], [address], [place], [place] [address]    Report Status PENDING  Incomplete  Culture, blood (Routine X 2) w Reflex to ID Panel     Status: None (Preliminary result)   Collection Time: 03/30/19 11:01 PM   Specimen: BLOOD  Result Value Ref Range Status   Specimen Description   Final    BLOOD Blood Culture results may not be optimal due to an excessive volume of blood received in culture bottles   Special Requests   Final    BOTTLES DRAWN AEROBIC AND ANAEROBIC BLOOD LEFT HAND   Culture   Final    NO GROWTH 4 DAYS Performed at [facility], [address] [place], [place] [address]    Report Status PENDING  Incomplete  Urine culture     Status: Abnormal   Collection Time: 03/31/19  6:30 AM   Specimen: Urine, Random  Result Value Ref Range Status   Specimen Description   Final    URINE, RANDOM Performed at [facility], [address], [place], [place] [address]    Special Requests   Final    Normal Performed at [facility], [address] [place], [place] [address]    Culture MULTIPLE SPECIES PRESENT, SUGGEST RECOLLECTION (A)  Final   Report Status 04/02/2019 FINAL  Final  Gastrointestinal Panel by PCR , Stool     Status: None   Collection Time: 03/31/19 10:35 AM   Specimen: Stool  Result Value Ref Range Status   Campylobacter species NOT DETECTED NOT  DETECTED Final   Plesimonas  shigelloides NOT DETECTED NOT DETECTED Final   Salmonella species NOT DETECTED NOT DETECTED Final   Yersinia enterocolitica NOT DETECTED NOT DETECTED Final   Vibrio species NOT DETECTED NOT DETECTED Final   Vibrio cholerae NOT DETECTED NOT DETECTED Final   Enteroaggregative E coli (EAEC) NOT DETECTED NOT DETECTED Final   Enteropathogenic E coli (EPEC) NOT DETECTED NOT DETECTED Final   Enterotoxigenic E coli (ETEC) NOT DETECTED NOT DETECTED Final   Shiga like toxin producing E coli (STEC) NOT DETECTED NOT DETECTED Final   Shigella/Enteroinvasive E coli (EIEC) NOT DETECTED NOT DETECTED Final   Cryptosporidium NOT DETECTED NOT DETECTED Final   Cyclospora cayetanensis NOT DETECTED NOT DETECTED Final   Entamoeba histolytica NOT DETECTED NOT DETECTED Final   Giardia lamblia NOT DETECTED NOT DETECTED Final   Adenovirus F40/41 NOT DETECTED NOT DETECTED Final   Astrovirus NOT DETECTED NOT DETECTED Final   Norovirus GI/GII NOT DETECTED NOT DETECTED Final   Rotavirus A NOT DETECTED NOT DETECTED Final   Sapovirus (I, II, IV, and V) NOT DETECTED NOT DETECTED Final    Comment: Performed at [facility], [address] [place], [place] [address]  C difficile quick scan w PCR reflex     Status: Abnormal   Collection Time: 03/31/19 10:35 AM   Specimen: STOOL  Result Value Ref Range Status   C Diff antigen POSITIVE (A) NEGATIVE Final   C Diff toxin NEGATIVE NEGATIVE Final   C Diff interpretation Results are indeterminate. See PCR results.  Final    Comment: Performed at [facility], [address] [place], [place] [address]  C. Diff by PCR, Reflexed     Status: Abnormal   Collection Time: 03/31/19 10:35 AM  Result Value Ref Range Status   Toxigenic C. Difficile by PCR POSITIVE (A) NEGATIVE Final    Comment: Positive for toxigenic C. difficile with little to no toxin production. Only treat if clinical presentation suggests symptomatic  illness. Performed at [facility], [address], [place], [place] [address]     RADIOLOGY:  No results found.  EKG:   Orders placed or performed during the hospital encounter of 04/01/19  . EKG 12-Lead  . EKG 12-Lead      Management plans discussed with the patient, family and they are in agreement.  CODE STATUS:     Code Status Orders  (From admission, onward)         Start     Ordered   04/02/19 0122  Full code  Continuous     04/02/19 0123        Code Status History    Date Active Date Inactive Code Status Order ID Comments User Context   03/30/2019 2349 04/01/2019 1809 Full Code [id number]  Mayer Camel, NP ED   03/06/2019 2256 03/07/2019 2039 Full Code [id number]  Mayer Camel, NP ED   02/14/2019 0456 02/15/2019 2030 Full Code [id number]  Harrie Foreman, MD Inpatient   02/02/2019 1729 02/04/2019 1804 Full Code [id number]  Hillary Bow, MD ED   01/03/2019 1944 01/04/2019 2003 Full Code [id number]  Salary, Avel Peace, MD Inpatient   02/23/2018 0126 02/24/2018 1539 Full Code [id number]  Lance Coon, MD ED   04/22/2017 0004 04/22/2017 2213 Full Code [id number]  Lance Coon, MD Inpatient   04/17/2017 2234 04/19/2017 2005 Full Code [id number]  [name], MD Inpatient   04/06/2016 1907 04/10/2016 1928 Full Code [id number]  Loletha Grayer, MD ED   Advance Care Planning Activity  TOTAL TIME TAKING CARE OF THIS PATIENT: 35 minutes.    [name] M.D on 04/03/2019 at 1:13 PM  Between 7am to 6pm - Pager - [phone]  After 6pm go to [email or web address] - password [email or web address] [facility]  Office  [phone]  CC: Primary care physician; [place]   Note: This dictation was prepared with Dragon dictation along with smaller phrase technology. Any transcriptional errors that result from this process are unintentional.

## 2019-04-03 NOTE — Consult Note (Signed)
[place] Nurse wound consult note Reason for Consult: Patient with uncontrolled DM and nonhealing trauma wound to left knee. States he "scraped " this area some time ago and has not healed.  He has not been treating.  Currently with uncontrollable stools from C. Diff .  Wound type:Nonhealing trauma wound Pressure Injury POA: NA Measurement: 2.5 cm x 2 cm x 0.2 cm  Wound SKS:HNGITJ with 0.1 cm plug fibrin slough. Otherwise, pale pink nongranulating tissue.,  Drainage (amount, consistency, odor) minimal serosanguinous  No odor.  Periwound:intact Dressing procedure/placement/frequency:Cleanse left knee with NS and pat dry. Apply mupirocin ointment and cover with foam dressing.  Teach patient to peel back foam and re-apply ointment daily and change foam every three days.  Please send home with ointment and 5 foam dressings.  Will not follow at this time.  Please re-consult if needed.  Domenic Moras MSN, RN, FNP-BC CWON Wound, Ostomy, Continence Nurse Pager [phone]

## 2019-04-03 NOTE — TOC Transition Note (Signed)
Transition of Care [facility]) - CM/SW Discharge Note   Patient Details  Name: Gregory Moody MRN: [id number] Date of Birth: [birth year]  Transition of Care [facility]) CM/SW Contact:  Beverly Sessions, RN Phone Number: 04/03/2019, 2:48 PM   Clinical Narrative:    Patient admitted from home with nausea and vomiting.  Patient lives at home with daughter.  PCP is [facility].  Per patient he is currently with his PCP.  Patient states he obtains his insulin from the [place] and each vial is $10.  Patient denies any issues obtaining his medication.  Previous admission in March RNCM provided glucometer testing kit  Patient to discharge today.  Patient received script for oral vanc on Sunday, but was not able to fill it prior to being readmitted the same today.   RNCM spoke with [facility] and they confirmed they have received scripts  For both zofran and vanc.  Zofran $10, and vanc 32.44.  Per pharmacy employee if patient is unable to afford the medications they work with them on the cost.  Employee request that patient called before he comes to get the medication because his PCP has to sign off on them prior to them being filled  RNCM spoke with patient via phone due to covid restrictions. He confirms that he has the pharmacy contact information.  Patient states that he will be taking a cab home, and will pick them up from the pharmacy if they are ready.  Or he will have his son take him tomorrow to pick them up.  RNCM provided patient with the cost of the medication and he confirms he will be able to pay for medication post discharge.    Final next level of care: Home/Self Care Barriers to Discharge: Barriers Resolved   Patient Goals and CMS Choice        Discharge Placement                       Discharge Plan and Services                                     Social Determinants of Health (SDOH)  Interventions     Readmission Risk Interventions Readmission Risk Prevention Plan 03/07/2019  Transportation Screening Complete  PCP or Specialist Appt within 3-5 Days Complete  Social Work Consult for [place] Planning/Counseling (No Data)  Palliative Care Screening Not Applicable  Medication Review Press photographer) Complete  Some recent data might be hidden

## 2019-04-03 NOTE — Progress Notes (Signed)
PHARMACIST - PHYSICIAN COMMUNICATION  DR:   Anselm Jungling  CONCERNING: IV to Oral Route Change Policy  RECOMMENDATION: This patient is receiving Protonix by the intravenous route.  Based on criteria approved by the Pharmacy and Therapeutics Committee, the intravenous medication(s) is/are being converted to the equivalent oral dose form(s).   DESCRIPTION: These criteria include:  The patient is eating (either orally or via tube) and/or has been taking other orally administered medications for a least 24 hours  The patient has no evidence of active gastrointestinal bleeding or impaired GI absorption (gastrectomy, short bowel, patient on TNA or NPO).  If you have questions about this conversion, please contact the Pharmacy Department  []   [phone] )  [facility] [x]   [phone] )  [facility] []   [phone] )  [facility] []   [phone] )  [facility] []   [phone] )  [place], PharmD, BCPS Clinical Pharmacist 04/03/2019 10:20 AM

## 2019-04-04 LAB — CULTURE, BLOOD (ROUTINE X 2)
Culture: NO GROWTH
Culture: NO GROWTH

## 2019-04-13 ENCOUNTER — Emergency Department
Admission: EM | Admit: 2019-04-13 | Discharge: 2019-04-14 | Disposition: A | Discharge status: Other Acute Inpatient Hospital | Payer: HRSA Program | Attending: Emergency Medicine | Admitting: Emergency Medicine

## 2019-04-13 ENCOUNTER — Other Ambulatory Visit: Payer: Self-pay

## 2019-04-13 ENCOUNTER — Emergency Department: Payer: HRSA Program

## 2019-04-13 ENCOUNTER — Encounter: Payer: Self-pay | Admitting: Emergency Medicine

## 2019-04-13 DIAGNOSIS — E1165 Type 2 diabetes mellitus with hyperglycemia: Secondary | ICD-10-CM | POA: Insufficient documentation

## 2019-04-13 DIAGNOSIS — E1143 Type 2 diabetes mellitus with diabetic autonomic (poly)neuropathy: Secondary | ICD-10-CM | POA: Insufficient documentation

## 2019-04-13 DIAGNOSIS — U071 COVID-19: Secondary | ICD-10-CM | POA: Diagnosis not present

## 2019-04-13 DIAGNOSIS — K859 Acute pancreatitis without necrosis or infection, unspecified: Secondary | ICD-10-CM | POA: Insufficient documentation

## 2019-04-13 DIAGNOSIS — R112 Nausea with vomiting, unspecified: Secondary | ICD-10-CM | POA: Diagnosis not present

## 2019-04-13 DIAGNOSIS — N179 Acute kidney failure, unspecified: Secondary | ICD-10-CM | POA: Diagnosis not present

## 2019-04-13 DIAGNOSIS — Z7982 Long term (current) use of aspirin: Secondary | ICD-10-CM | POA: Insufficient documentation

## 2019-04-13 DIAGNOSIS — E860 Dehydration: Secondary | ICD-10-CM

## 2019-04-13 DIAGNOSIS — K3184 Gastroparesis: Secondary | ICD-10-CM | POA: Diagnosis not present

## 2019-04-13 DIAGNOSIS — R1110 Vomiting, unspecified: Secondary | ICD-10-CM | POA: Diagnosis present

## 2019-04-13 DIAGNOSIS — R739 Hyperglycemia, unspecified: Secondary | ICD-10-CM

## 2019-04-13 LAB — BLOOD GAS, VENOUS
Acid-base deficit: 11.6 mmol/L — ABNORMAL HIGH (ref 0.0–2.0)
Bicarbonate: 14.5 mmol/L — ABNORMAL LOW (ref 20.0–28.0)
O2 Saturation: 45.3 %
Patient temperature: 37
pCO2, Ven: 33 mmHg — ABNORMAL LOW (ref 44.0–60.0)
pH, Ven: 7.25 (ref 7.250–7.430)
pO2, Ven: <31 mmHg — CL (ref 32.0–45.0)

## 2019-04-13 LAB — COMPREHENSIVE METABOLIC PANEL
ALT: 51 U/L — ABNORMAL HIGH (ref 0–44)
AST: 43 U/L — ABNORMAL HIGH (ref 15–41)
Albumin: 3.4 g/dL — ABNORMAL LOW (ref 3.5–5.0)
Alkaline Phosphatase: 89 U/L (ref 38–126)
Anion gap: 18 — ABNORMAL HIGH (ref 5–15)
BUN: 74 mg/dL — ABNORMAL HIGH (ref 6–20)
CO2: 11 mmol/L — ABNORMAL LOW (ref 22–32)
Calcium: 8 mg/dL — ABNORMAL LOW (ref 8.9–10.3)
Chloride: 105 mmol/L (ref 98–111)
Creatinine, Ser: 7.75 mg/dL — ABNORMAL HIGH (ref 0.61–1.24)
GFR calc Af Amer: 9 mL/min — ABNORMAL LOW (ref >60)
GFR calc non Af Amer: 8 mL/min — ABNORMAL LOW (ref >60)
Glucose, Bld: 347 mg/dL — ABNORMAL HIGH (ref 70–99)
Potassium: 4.3 mmol/L (ref 3.5–5.1)
Sodium: 134 mmol/L — ABNORMAL LOW (ref 135–145)
Total Bilirubin: 0.7 mg/dL (ref 0.3–1.2)
Total Protein: 7.6 g/dL (ref 6.5–8.1)

## 2019-04-13 LAB — CBC
HCT: 27.8 % — ABNORMAL LOW (ref 39.0–52.0)
Hemoglobin: 9.1 g/dL — ABNORMAL LOW (ref 13.0–17.0)
MCH: 30.4 pg (ref 26.0–34.0)
MCHC: 32.7 g/dL (ref 30.0–36.0)
MCV: 93 fL (ref 80.0–100.0)
Platelets: 273 10*3/uL (ref 150–400)
RBC: 2.99 MIL/uL — ABNORMAL LOW (ref 4.22–5.81)
RDW: 14 % (ref 11.5–15.5)
WBC: 4.6 10*3/uL (ref 4.0–10.5)
nRBC: 0 % (ref 0.0–0.2)

## 2019-04-13 LAB — TROPONIN I (HIGH SENSITIVITY): Troponin I (High Sensitivity): 8 ng/L (ref <18)

## 2019-04-13 LAB — SARS CORONAVIRUS 2 BY RT PCR (HOSPITAL ORDER, PERFORMED IN ~~LOC~~ HOSPITAL LAB): SARS Coronavirus 2: POSITIVE — AB

## 2019-04-13 LAB — LIPASE, BLOOD: Lipase: 289 U/L — ABNORMAL HIGH (ref 11–51)

## 2019-04-13 IMAGING — DX PORTABLE CHEST - 1 VIEW
1 series · 1 of 1 positions shown · non-contrast
Comparison: [DATE]

CLINICAL DATA: Chest discomfort.  History of tuberculosis.

EXAM:
PORTABLE CHEST 1 VIEW

[chest ap]
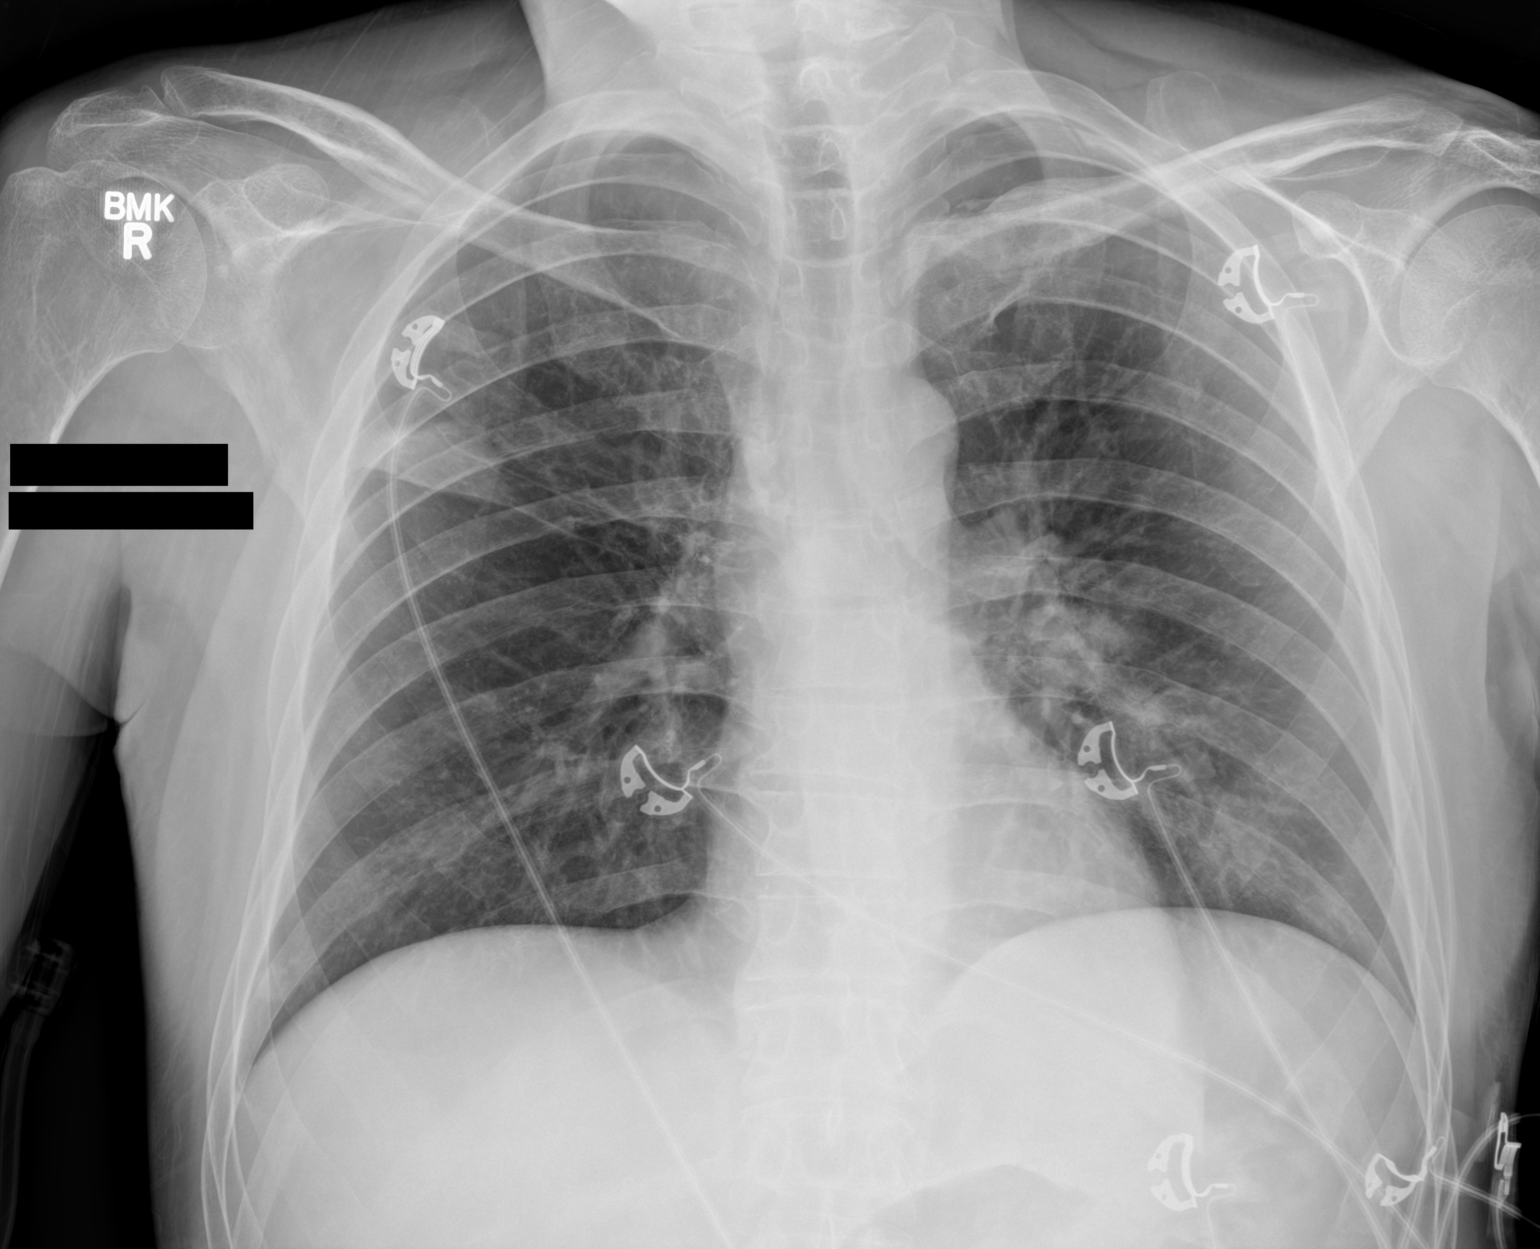

[1 of 1 positions shown; findings below may reference images not displayed]

FINDINGS: The nodular density in the right upper lung is stable and chronic.
There is mild volume loss in the right upper lobe. The heart and
mediastinum is stable. There is some increased opacity in the region
of the left perihilar region, not seen previously. No other changes.
IMPRESSION: Suspected developing subtle infiltrate in the left perihilar region.
A PA and lateral chest x-ray may better evaluate. Alternatively,
recommend attention on follow-up. No other interval change.

## 2019-04-13 MED ORDER — METOCLOPRAMIDE HCL 5 MG/ML IJ SOLN
5.0000 mg | Freq: Once | INTRAMUSCULAR | Status: AC
  Administered 2019-04-14: 5 mg via INTRAVENOUS
  Filled 2019-04-13: qty 2

## 2019-04-13 MED ORDER — ONDANSETRON HCL 4 MG/2ML IJ SOLN
4.0000 mg | Freq: Once | INTRAMUSCULAR | Status: AC
  Administered 2019-04-13: 4 mg via INTRAVENOUS
  Filled 2019-04-13: qty 2

## 2019-04-13 MED ORDER — SODIUM CHLORIDE 0.9 % IV SOLN
1000.0000 mL | Freq: Once | INTRAVENOUS | Status: AC
  Administered 2019-04-13: 1000 mL via INTRAVENOUS

## 2019-04-13 MED ORDER — SODIUM CHLORIDE 0.9 % IV SOLN
500.0000 mg | Freq: Once | INTRAVENOUS | Status: AC
  Administered 2019-04-13: 500 mg via INTRAVENOUS
  Filled 2019-04-13: qty 500

## 2019-04-13 MED ORDER — SODIUM CHLORIDE 0.9 % IV SOLN
1000.0000 mL | Freq: Once | INTRAVENOUS | Status: AC
  Administered 2019-04-14: 1000 mL via INTRAVENOUS

## 2019-04-13 MED ORDER — SODIUM CHLORIDE 0.9 % IV BOLUS
1000.0000 mL | Freq: Once | INTRAVENOUS | Status: AC
  Administered 2019-04-13: 1000 mL via INTRAVENOUS

## 2019-04-13 MED ORDER — SODIUM CHLORIDE 0.9 % IV SOLN
1.0000 g | Freq: Once | INTRAVENOUS | Status: AC
  Administered 2019-04-13: 1 g via INTRAVENOUS
  Filled 2019-04-13: qty 10

## 2019-04-13 MED ORDER — INSULIN ASPART 100 UNIT/ML ~~LOC~~ SOLN
8.0000 [IU] | Freq: Once | SUBCUTANEOUS | Status: AC
  Administered 2019-04-13: 8 [IU] via SUBCUTANEOUS
  Filled 2019-04-13: qty 1

## 2019-04-13 NOTE — ED Notes (Signed)
ED TO INPATIENT HANDOFF REPORT  ED Nurse Name and Phone #: Suzi Roots [phone]  S Name/Age/Gender Gregory Moody 43 y.o. male Room/Bed: ED04A/ED04A  Code Status   Code Status: Prior  Home/SNF/Other Home Patient oriented to: self, place and situation Is this baseline? No   Triage Complete: Triage complete  Chief Complaint vomiting  Triage Note Pt arrives via ems from home with complaints of nausea/vomitting/weakness for the last 2 days. Pt given 4mg  of zofran in route and bolus of NS established. Pt hypotensive in route at 88/62. Pt's CBG 403; pt states he last took his insulin Monday.     Allergies No Known Allergies  Level of Care/Admitting Diagnosis ED Disposition    None      B Medical/Surgery History Past Medical History:  Diagnosis Date  . Diabetes mellitus without complication ([place])   . Gastroparesis   . Tuberculosis    Past Surgical History:  Procedure Laterality Date  . ESOPHAGOGASTRODUODENOSCOPY N/A 02/03/2019   Procedure: ESOPHAGOGASTRODUODENOSCOPY (EGD);  Surgeon: Toledo, Benay Pike, MD;  Location: [facility] ENDOSCOPY;  Service: Gastroenterology;  Laterality: N/A;  . NO PAST SURGERIES       A IV Location/Drains/Wounds Patient Lines/Drains/Airways Status   Active Line/Drains/Airways    Name:   Placement date:   Placement time:   Site:   Days:   Peripheral IV 04/13/19 Left Antecubital   04/13/19    2137    Antecubital   less than 1   Peripheral IV 04/13/19 Left Arm   04/13/19    2140    Arm   less than 1   Pressure Injury 03/31/19 Knee Anterior;Left Stage II -  Partial thickness loss of dermis presenting as a shallow open ulcer with a red, pink wound bed without slough.   03/31/19    0254     13   Wound / Incision (Open or Dehisced) 04/02/19 Diabetic ulcer   04/02/19    0530    -   11          Intake/Output Last 24 hours  Intake/Output Summary (Last 24 hours) at 04/13/2019 2356 Last data filed at 04/13/2019 2340 Gross per 24 hour   Intake 1000 ml  Output -  Net 1000 ml    Labs/Imaging Results for orders placed or performed during the hospital encounter of 04/13/19 (from the past 48 hour(s))  Lipase, blood     Status: Abnormal   Collection Time: 04/13/19  9:38 PM  Result Value Ref Range   Lipase 289 (H) 11 - 51 U/L    Comment: Performed at [facility], [address] [place], [place] [address]  Comprehensive metabolic panel     Status: Abnormal   Collection Time: 04/13/19  9:38 PM  Result Value Ref Range   Sodium 134 (L) 135 - 145 mmol/L   Potassium 4.3 3.5 - 5.1 mmol/L   Chloride 105 98 - 111 mmol/L   CO2 11 (L) 22 - 32 mmol/L   Glucose, Bld 347 (H) 70 - 99 mg/dL   BUN 74 (H) 6 - 20 mg/dL   Creatinine, Ser 7.75 (H) 0.61 - 1.24 mg/dL   Calcium 8.0 (L) 8.9 - 10.3 mg/dL   Total Protein 7.6 6.5 - 8.1 g/dL   Albumin 3.4 (L) 3.5 - 5.0 g/dL   AST 43 (H) 15 - 41 U/L   ALT 51 (H) 0 - 44 U/L   Alkaline Phosphatase 89 38 - 126 U/L   Total Bilirubin 0.7 0.3 -  1.2 mg/dL   GFR calc non Af Amer 8 (L) >60 mL/min   GFR calc Af Amer 9 (L) >60 mL/min   Anion gap 18 (H) 5 - 15    Comment: Performed at [facility], [address] [place], [place] [address]  CBC     Status: Abnormal   Collection Time: 04/13/19  9:38 PM  Result Value Ref Range   WBC 4.6 4.0 - 10.5 K/uL   RBC 2.99 (L) 4.22 - 5.81 MIL/uL   Hemoglobin 9.1 (L) 13.0 - 17.0 g/dL   HCT 27.8 (L) 39.0 - 52.0 %   MCV 93.0 80.0 - 100.0 fL   MCH 30.4 26.0 - 34.0 pg   MCHC 32.7 30.0 - 36.0 g/dL   RDW 14.0 11.5 - 15.5 %   Platelets 273 150 - 400 K/uL   nRBC 0.0 0.0 - 0.2 %    Comment: Performed at [facility], [place], [place] [address]  Troponin I (High Sensitivity)     Status: None   Collection Time: 04/13/19  9:38 PM  Result Value Ref Range   Troponin I (High Sensitivity) 8 <18 ng/L    Comment: (NOTE) Elevated high sensitivity troponin I (hsTnI) values and significant  changes across serial measurements  may suggest ACS but many other  chronic and acute conditions are known to elevate hsTnI results.  Refer to the "Links" section for chest pain algorithms and additional  guidance. Performed at [facility], [address] [place], [place] [address]   Blood gas, venous     Status: Abnormal   Collection Time: 04/13/19  9:39 PM  Result Value Ref Range   pH, Ven 7.25 7.250 - 7.430   pCO2, Ven 33 (L) 44.0 - 60.0 mmHg   pO2, Ven <31.0 (LL) 32.0 - 45.0 mmHg    Comment: CRITICAL RESULT CALLED TO, READ BACK BY AND VERIFIED WITH: KAYLEI, RN AT 2202 ON 04/13/19 [facility], RT    Bicarbonate 14.5 (L) 20.0 - 28.0 mmol/L   Acid-base deficit 11.6 (H) 0.0 - 2.0 mmol/L   O2 Saturation 45.3 %   Patient temperature 37.0    Collection site VEIN    Sample type VENOUS     Comment: Performed at [facility], [address], [place], [place] [address]  SARS Coronavirus 2 (CEPHEID- Performed in [place] hospital lab), Hosp Order     Status: Abnormal   Collection Time: 04/13/19 10:02 PM   Specimen: Nasopharyngeal Swab  Result Value Ref Range   SARS Coronavirus 2 POSITIVE (A) NEGATIVE    Comment: RESULT CALLED TO, READ BACK BY AND VERIFIED WITH: Matan Steen ON 04/13/2019 AT 2256 QSD (NOTE) If result is NEGATIVE SARS-CoV-2 target nucleic acids are NOT DETECTED. The SARS-CoV-2 RNA is generally detectable in upper and lower  respiratory specimens during the acute phase of infection. The lowest  concentration of SARS-CoV-2 viral copies this assay can detect is 250  copies / mL. A negative result does not preclude SARS-CoV-2 infection  and should not be used as the sole basis for treatment or other  patient management decisions.  A negative result may occur with  improper specimen collection / handling, submission of specimen other  than nasopharyngeal swab, presence of viral mutation(s) within the  areas targeted by this assay, and inadequate number of viral copies  (<250 copies / mL). A  negative result must be combined with clinical  observations, patient history, and epidemiological information. If result is  POSITIVE SARS-CoV-2 target nucleic acids are DETECTED.  The SARS-CoV-2 RNA is generally detectable in upper and lower  respiratory specimens during the acute phase of infection.  Positive  results are indicative of active infection with SARS-CoV-2.  Clinical  correlation with patient history and other diagnostic information is  necessary to determine patient infection status.  Positive results do  not rule out bacterial infection or co-infection with other viruses. If result is PRESUMPTIVE POSTIVE SARS-CoV-2 nucleic acids MAY BE PRESENT.   A presumptive positive result was obtained on the submitted specimen  and confirmed on repeat testing.  While 2019 novel coronavirus  (SARS-CoV-2) nucleic acids may be present in the submitted sample  additional confirmatory testing may be necessary for epidemiological  and / or clinical management purposes  to differentiate between  SARS-CoV-2 and other Sarbecovirus currently known to infect humans.  If clinically indicated additional testing with an alternate test  methodology [phone])  is advised. The SARS-CoV-2 RNA is generally  detectable in upper and lower respiratory specimens during the acute  phase of infection. The expected result is Negative. Fact Sheet for Patients:  StrictlyIdeas.no Fact Sheet for Healthcare Providers: BankingDealers.co.za This test is not yet approved or cleared by the [place] FDA and has been authorized for detection and/or diagnosis of SARS-CoV-2 by FDA under an Emergency Use Authorization (EUA).  This EUA will remain in effect (meaning this test can be used) for the duration of the COVID-19 declaration under Section 564(b)(1) of the Act, 21 U.S.C. section 360bbb-3(b)(1), unless the authorization is terminated or revoked sooner. Performed  at [facility], [address], [place], [place] [address]    Dg Chest Port 1 View  Result Date: 04/13/2019 CLINICAL DATA:  Chest discomfort.  History of tuberculosis. EXAM: PORTABLE CHEST 1 VIEW COMPARISON:  March 30, 2019 FINDINGS: The nodular density in the right upper lung is stable and chronic. There is mild volume loss in the right upper lobe. The heart and mediastinum is stable. There is some increased opacity in the region of the left perihilar region, not seen previously. No other changes. IMPRESSION: Suspected developing subtle infiltrate in the left perihilar region. A PA and lateral chest x-ray may better evaluate. Alternatively, recommend attention on follow-up. No other interval change. Electronically Signed   By: Dorise Bullion III M.D   On: 04/13/2019 22:55    Pending Labs Unresulted Labs (From admission, onward)    Start     Ordered   04/13/19 2306  Culture, blood (routine x 2)  BLOOD CULTURE X 2,   STAT     04/13/19 2305   04/13/19 2305  Lactic acid, plasma  Now then every 2 hours,   STAT     04/13/19 2305   04/13/19 2305  Beta-hydroxybutyric acid  Add-on,   AD     04/13/19 2305   04/13/19 2136  Urinalysis, Complete w Microscopic  ONCE - STAT,   STAT     04/13/19 2135          Vitals/Pain Today's Vitals   04/13/19 2230 04/13/19 2300 04/13/19 2315 04/13/19 2330  BP: 107/68 115/75 95/72 110/81  Pulse: 80  73 74  Resp: 14 14 15  (!) 25  Temp:      TempSrc:      SpO2: 100%  100% 100%  Weight:      Height:      PainSc:        Isolation Precautions Airborne and Contact precautions  Medications Medications  cefTRIAXone (ROCEPHIN) 1 g in sodium chloride 0.9 % 100 mL IVPB (1 g Intravenous New Bag/Given 04/13/19 2342)  azithromycin (ZITHROMAX) 500 mg in sodium chloride 0.9 % 250 mL IVPB (500 mg Intravenous New Bag/Given 04/13/19 2343)  0.9 %  sodium chloride infusion (has no administration in time range)  metoCLOPramide (REGLAN) injection 5 mg (has no  administration in time range)  0.9 %  sodium chloride infusion (0 mLs Intravenous Stopped 04/13/19 2340)  ondansetron (ZOFRAN) injection 4 mg (4 mg Intravenous Given 04/13/19 2146)  sodium chloride 0.9 % bolus 1,000 mL (1,000 mLs Intravenous New Bag/Given 04/13/19 2341)  insulin aspart (novoLOG) injection 8 Units (8 Units Subcutaneous Given 04/13/19 2330)    Mobility walks with device Moderate fall risk   Focused Assessments Cardiac Assessment Handoff:  Cardiac Rhythm: Normal sinus rhythm Lab Results  Component Value Date   CKTOTAL 193 04/01/2019   TROPONINI <0.03 03/06/2019   No results found for: DDIMER Does the Patient currently have chest pain? No  , Pulmonary Assessment Handoff:  Lung sounds:   O2 Device: Room Air        R Recommendations: See Admitting Provider Note  Report given to:   Additional Notes: Pt has multiple recent ED visits and admission w/i past week

## 2019-04-13 NOTE — ED Provider Notes (Signed)
[facility] Emergency Department Provider Note   ____________________________________________   First MD Initiated Contact with Patient 04/13/19 2304     (approximate)  I have reviewed the triage vital signs and the nursing notes.   HISTORY  Chief Complaint Emesis    HPI Gregory Moody is a 43 y.o. male who presents to the ED from home via EMS with a chief complaint of nausea, vomiting and generalized weakness. Patient has been in and out of the hospital since mid-May with dehydration, N/V secondary to diabetic gastroparesis. Reports last took his insulin 5 days ago. EMS reports initial BP 88/62 which improved en route with IV fluids. Given 4mg  IV Zofran per EMS. Denies fever, cough, chest pain, shortness of breath, abdominal pain, diarrhea. Denies recent travel, trauma or exposure to persons diagnosed with Coronavirus.       Past Medical History:  Diagnosis Date  . Diabetes mellitus without complication ([place])   . Gastroparesis   . Tuberculosis     Patient Active Problem List   Diagnosis Date Noted  . Nausea and vomiting 04/02/2019  . Nausea vomiting and diarrhea   . Pressure injury of skin 03/31/2019  . Acute renal failure (ARF) ([place]) 03/06/2019  . CAP (community acquired pneumonia) 02/14/2019  . AKI (acute kidney injury) ([place]) 02/02/2019  . ARF (acute renal failure) ([place]) 01/03/2019  . Malnutrition of moderate degree 02/24/2018  . GERD (gastroesophageal reflux disease) 02/23/2018  . Diabetic gastroparesis ([place]) 02/23/2018  . Diabetic foot ulcer ([place]) 02/23/2018  . Aspiration pneumonia ([place]) 04/21/2017  . Hypoglycemia 04/21/2017  . Diabetes mellitus with hyperglycemia ([place]) 04/21/2017  . Hip fracture, unspecified laterality, closed, initial encounter ([place]) 04/17/2017  . Hyperglycemia 04/17/2017  . Protein-calorie malnutrition, severe 04/09/2016  . Cavitary lesion of lung 04/06/2016    Past Surgical History:  Procedure Laterality  Date  . ESOPHAGOGASTRODUODENOSCOPY N/A 02/03/2019   Procedure: ESOPHAGOGASTRODUODENOSCOPY (EGD);  Surgeon: Toledo, Benay Pike, MD;  Location: [facility] ENDOSCOPY;  Service: Gastroenterology;  Laterality: N/A;  . NO PAST SURGERIES      Prior to Admission medications   Medication Sig Start Date End Date Taking? Authorizing Provider  aspirin EC 81 MG tablet Take 81 mg by mouth daily.   Yes [provider]  insulin NPH-regular Human (70-30) 100 UNIT/ML injection Inject 20 Units into the skin 2 (two) times daily with a meal. 25 units with breakfast in am and 35 units with supper 04/03/19  Yes [name], MD  pantoprazole (PROTONIX) 40 MG tablet Take 1 tablet (40 mg total) by mouth 2 (two) times daily before a meal. 04/03/19  Yes [name], MD  ondansetron (ZOFRAN) 4 MG tablet Take 1 tablet (4 mg total) by mouth every 6 (six) hours as needed for nausea. 04/03/19   [name], MD    Allergies Patient has no known allergies.  Family History  Problem Relation Age of Onset  . Diabetes Mother   . Diabetes Father     Social History Social History   Tobacco Use  . Smoking status: Never Smoker  . Smokeless tobacco: Never Used  Substance Use Topics  . Alcohol use: No  . Drug use: No    Review of Systems  Constitutional: No fever/chills Eyes: No visual changes. ENT: No sore throat. Cardiovascular: Denies chest pain. Respiratory: Denies shortness of breath. Gastrointestinal: No abdominal pain.  Positive for nausea and vomiting.  No diarrhea.  No constipation. Genitourinary: Negative for dysuria. Musculoskeletal: Negative for back pain. Skin: Negative for rash.  Neurological: Negative for headaches, focal weakness or numbness.   ____________________________________________   PHYSICAL EXAM:  VITAL SIGNS: ED Triage Vitals  Enc Vitals Group     BP 04/13/19 2136 (!) 78/56     Pulse Rate 04/13/19 2135 72     Resp 04/13/19 2135 18     Temp  04/13/19 2135 97.6 F (36.4 C)     Temp Source 04/13/19 2135 Oral     SpO2 04/13/19 2135 100 %     Weight 04/13/19 2134 130 lb (59 kg)     Height 04/13/19 2134 5\' 8"  (1.727 m)     Head Circumference --      Peak Flow --      Pain Score 04/13/19 2134 0     Pain Loc --      Pain Edu? --      Excl. in [place]? --     Constitutional: Alert and oriented. Cachectic appearing and in mild acute distress. Eyes: Conjunctivae are normal. PERRL. EOMI. Head: Atraumatic. Nose: No congestion/rhinnorhea. Mouth/Throat: Mucous membranes are mildly dry.  Oropharynx non-erythematous. Neck: No stridor.   Cardiovascular: Normal rate, regular rhythm. Grossly normal heart sounds.  Good peripheral circulation. Respiratory: Normal respiratory effort.  No retractions. Lungs CTAB. Gastrointestinal: Soft and nontender to light or deep palpation. No distention. No abdominal bruits. No CVA tenderness. Musculoskeletal: No lower extremity tenderness nor edema.  No joint effusions. Neurologic:  Normal speech and language. No gross focal neurologic deficits are appreciated.  Skin:  Skin is warm, dry and intact. No rash noted. Psychiatric: Mood and affect are normal. Speech and behavior are normal.  ____________________________________________   LABS (all labs ordered are listed, but only abnormal results are displayed)  Labs Reviewed  SARS CORONAVIRUS 2 (HOSPITAL ORDER, [facility]) - Abnormal; Notable for the following components:      Result Value   SARS Coronavirus 2 POSITIVE (*)    All other components within normal limits  LIPASE, BLOOD - Abnormal; Notable for the following components:   Lipase 289 (*)    All other components within normal limits  COMPREHENSIVE METABOLIC PANEL - Abnormal; Notable for the following components:   Sodium 134 (*)    CO2 11 (*)    Glucose, Bld 347 (*)    BUN 74 (*)    Creatinine, Ser 7.75 (*)    Calcium 8.0 (*)    Albumin 3.4 (*)    AST 43 (*)     ALT 51 (*)    GFR calc non Af Amer 8 (*)    GFR calc Af Amer 9 (*)    Anion gap 18 (*)    All other components within normal limits  CBC - Abnormal; Notable for the following components:   RBC 2.99 (*)    Hemoglobin 9.1 (*)    HCT 27.8 (*)    All other components within normal limits  BLOOD GAS, VENOUS - Abnormal; Notable for the following components:   pCO2, Ven 33 (*)    pO2, Ven <31.0 (*)    Bicarbonate 14.5 (*)    Acid-base deficit 11.6 (*)    All other components within normal limits  BETA-HYDROXYBUTYRIC ACID - Abnormal; Notable for the following components:   Beta-Hydroxybutyric Acid 0.91 (*)    All other components within normal limits  GLUCOSE, CAPILLARY - Abnormal; Notable for the following components:   Glucose-Capillary 261 (*)    All other components within normal limits  BASIC METABOLIC PANEL -  Abnormal; Notable for the following components:   Chloride 114 (*)    CO2 13 (*)    Glucose, Bld 198 (*)    BUN 68 (*)    Creatinine, Ser 6.46 (*)    Calcium 7.1 (*)    GFR calc non Af Amer 10 (*)    GFR calc Af Amer 11 (*)    All other components within normal limits  CULTURE, BLOOD (ROUTINE X 2)  CULTURE, BLOOD (ROUTINE X 2)  TROPONIN I (HIGH SENSITIVITY)  LACTIC ACID, PLASMA  URINALYSIS, COMPLETE (UACMP) WITH MICROSCOPIC   ____________________________________________  EKG  ED ECG REPORT I, Kissy Cielo J, the attending physician, personally viewed and interpreted this ECG.   Date: 04/13/2019  EKG Time: 2137  Rate: 71  Rhythm: normal EKG, normal sinus rhythm  Axis: Normal  Intervals:none  ST&T Change: Nonspecific ____________________________________________  RADIOLOGY  ED MD interpretation:  Possible left infiltrate  Official radiology report(s): Dg Chest Port 1 View  Result Date: 04/13/2019 CLINICAL DATA:  Chest discomfort.  History of tuberculosis. EXAM: PORTABLE CHEST 1 VIEW COMPARISON:  March 30, 2019 FINDINGS: The nodular density in the right upper  lung is stable and chronic. There is mild volume loss in the right upper lobe. The heart and mediastinum is stable. There is some increased opacity in the region of the left perihilar region, not seen previously. No other changes. IMPRESSION: Suspected developing subtle infiltrate in the left perihilar region. A PA and lateral chest x-ray may better evaluate. Alternatively, recommend attention on follow-up. No other interval change. Electronically Signed   By: Dorise Bullion III M.D   On: 04/13/2019 22:55    ____________________________________________   PROCEDURES  Procedure(s) performed (including Critical Care):  Procedures  CRITICAL CARE Performed by: Paulette Blanch   Total critical care time: 60 minutes  Critical care time was exclusive of separately billable procedures and treating other patients.  Critical care was necessary to treat or prevent imminent or life-threatening deterioration.  Critical care was time spent personally by me on the following activities: development of treatment plan with patient and/or surrogate as well as nursing, discussions with consultants, evaluation of patient's response to treatment, examination of patient, obtaining history from patient or surrogate, ordering and performing treatments and interventions, ordering and review of laboratory studies, ordering and review of radiographic studies, pulse oximetry and re-evaluation of patient's condition. ____________________________________________   INITIAL IMPRESSION / ASSESSMENT AND PLAN / ED COURSE  As part of my medical decision making, I reviewed the following data within the [facility] notes reviewed and incorporated, Labs reviewed, EKG interpreted, Old chart reviewed, Radiograph reviewed and Notes from prior ED visits     Adolphe Fortunato was evaluated in Emergency Department on 04/14/2019 for the symptoms described in the history of present illness. He was evaluated in  the context of the global COVID-19 pandemic, which necessitated consideration that the patient might be at risk for infection with the SARS-CoV-2 virus that causes COVID-19. Institutional protocols and algorithms that pertain to the evaluation of patients at risk for COVID-19 are in a state of rapid change based on information released by regulatory bodies including the CDC and federal and state organizations. These policies and algorithms were followed during the patient's care in the ED.   43 year old male with diabetic gastroparesis who returns with nausea, vomiting and generalized weakness. Differential diagnosis includes but is not limited to dehydration, DKA, infectious, metabolic etiologies, etc.   Laboratory results remarkable for Covid +,  acute renal failure, elevated lipase, elevated AG which I suspect is secondary to uremia and not DKA as patient has not had DKA in the past. Requires transfer to [facility] given his +Covid status coupled with acute renal failure. Will administer IV fluids, cover with empiric antibiotics.   Clinical Course as of Apr 13 653  Sat Apr 14, 2019  0014 Spoke with Dr. Loleta Books from [facility] who asks me to speak with [facility] hospital regarding transfer due to patient's acute renal failure possibility requiring dialysis.   [JS]  0021 Accepted by Dr. Myna Hidalgo to [facility]. Updated patient who is agreeable with plan of care.   [JS]  0151 Patient will be held in the ED overnight as [facility] does not currently have a bed available but will in the morning. Patient currently sleeping in NAD.   [JS]  8938 Repeat metabolic panel slightly improved. Normal AG, mild reduction of BUN/Creatinine.   [JS]  [id number] Patient remains in the ED awaiting bed at [facility]. Maintenance fluids going.    [JS]    Clinical Course User Index [JS] Paulette Blanch, MD     ____________________________________________   FINAL CLINICAL IMPRESSION(S) / ED DIAGNOSES  Final  diagnoses:  Dehydration  Non-intractable vomiting with nausea, unspecified vomiting type  COVID-19  Acute renal failure, unspecified acute renal failure type ([place])  Acute pancreatitis, unspecified complication status, unspecified pancreatitis type  Hyperglycemia  Gastroparesis due to DM [facility])     ED Discharge Orders    None       Note:  This document was prepared using Dragon voice recognition software and may include unintentional dictation errors.   Paulette Blanch, MD 04/14/19 [phone]

## 2019-04-13 NOTE — ED Triage Notes (Signed)
Pt arrives via ems from home with complaints of nausea/vomitting/weakness for the last 2 days. Pt given 4mg  of zofran in route and bolus of NS established. Pt hypotensive in route at 88/62. Pt's CBG 403; pt states he last took his insulin Monday.

## 2019-04-14 ENCOUNTER — Inpatient Hospital Stay (HOSPITAL_COMMUNITY)
Admission: AD | Admit: 2019-04-14 | Discharge: 2019-04-20 | DRG: 177 | Disposition: A | Discharge status: Home Health | Payer: HRSA Program | Source: Other Acute Inpatient Hospital | Attending: Internal Medicine | Admitting: Internal Medicine

## 2019-04-14 ENCOUNTER — Inpatient Hospital Stay (HOSPITAL_COMMUNITY): Payer: HRSA Program

## 2019-04-14 DIAGNOSIS — D638 Anemia in other chronic diseases classified elsewhere: Secondary | ICD-10-CM | POA: Diagnosis not present

## 2019-04-14 DIAGNOSIS — U071 COVID-19: Principal | ICD-10-CM | POA: Diagnosis present

## 2019-04-14 DIAGNOSIS — E1143 Type 2 diabetes mellitus with diabetic autonomic (poly)neuropathy: Secondary | ICD-10-CM | POA: Diagnosis present

## 2019-04-14 DIAGNOSIS — Z7982 Long term (current) use of aspirin: Secondary | ICD-10-CM | POA: Diagnosis not present

## 2019-04-14 DIAGNOSIS — E1122 Type 2 diabetes mellitus with diabetic chronic kidney disease: Secondary | ICD-10-CM | POA: Diagnosis present

## 2019-04-14 DIAGNOSIS — Z794 Long term (current) use of insulin: Secondary | ICD-10-CM

## 2019-04-14 DIAGNOSIS — J15211 Pneumonia due to Methicillin susceptible Staphylococcus aureus: Secondary | ICD-10-CM | POA: Diagnosis not present

## 2019-04-14 DIAGNOSIS — E11649 Type 2 diabetes mellitus with hypoglycemia without coma: Secondary | ICD-10-CM | POA: Diagnosis not present

## 2019-04-14 DIAGNOSIS — Z833 Family history of diabetes mellitus: Secondary | ICD-10-CM | POA: Diagnosis not present

## 2019-04-14 DIAGNOSIS — K3184 Gastroparesis: Secondary | ICD-10-CM | POA: Diagnosis present

## 2019-04-14 DIAGNOSIS — E860 Dehydration: Secondary | ICD-10-CM | POA: Diagnosis present

## 2019-04-14 DIAGNOSIS — Z09 Encounter for follow-up examination after completed treatment for conditions other than malignant neoplasm: Secondary | ICD-10-CM

## 2019-04-14 DIAGNOSIS — K859 Acute pancreatitis without necrosis or infection, unspecified: Secondary | ICD-10-CM | POA: Diagnosis present

## 2019-04-14 DIAGNOSIS — J1289 Other viral pneumonia: Secondary | ICD-10-CM | POA: Diagnosis not present

## 2019-04-14 DIAGNOSIS — N1330 Unspecified hydronephrosis: Secondary | ICD-10-CM

## 2019-04-14 DIAGNOSIS — X58XXXA Exposure to other specified factors, initial encounter: Secondary | ICD-10-CM | POA: Diagnosis present

## 2019-04-14 DIAGNOSIS — K219 Gastro-esophageal reflux disease without esophagitis: Secondary | ICD-10-CM | POA: Diagnosis present

## 2019-04-14 DIAGNOSIS — N170 Acute kidney failure with tubular necrosis: Secondary | ICD-10-CM | POA: Diagnosis not present

## 2019-04-14 DIAGNOSIS — E872 Acidosis: Secondary | ICD-10-CM | POA: Diagnosis not present

## 2019-04-14 DIAGNOSIS — K529 Noninfective gastroenteritis and colitis, unspecified: Secondary | ICD-10-CM | POA: Diagnosis not present

## 2019-04-14 DIAGNOSIS — E1165 Type 2 diabetes mellitus with hyperglycemia: Secondary | ICD-10-CM | POA: Diagnosis present

## 2019-04-14 DIAGNOSIS — R0602 Shortness of breath: Secondary | ICD-10-CM

## 2019-04-14 DIAGNOSIS — N183 Chronic kidney disease, stage 3 (moderate): Secondary | ICD-10-CM | POA: Diagnosis present

## 2019-04-14 DIAGNOSIS — N179 Acute kidney failure, unspecified: Secondary | ICD-10-CM | POA: Diagnosis present

## 2019-04-14 DIAGNOSIS — S80212A Abrasion, left knee, initial encounter: Secondary | ICD-10-CM | POA: Diagnosis present

## 2019-04-14 DIAGNOSIS — E876 Hypokalemia: Secondary | ICD-10-CM | POA: Diagnosis not present

## 2019-04-14 DIAGNOSIS — R112 Nausea with vomiting, unspecified: Secondary | ICD-10-CM | POA: Diagnosis present

## 2019-04-14 LAB — CBC WITH DIFFERENTIAL/PLATELET
Abs Immature Granulocytes: 0.02 10*3/uL (ref 0.00–0.07)
Basophils Absolute: 0 10*3/uL (ref 0.0–0.1)
Basophils Relative: 0 %
Eosinophils Absolute: 0 10*3/uL (ref 0.0–0.5)
Eosinophils Relative: 0 %
HCT: 26.1 % — ABNORMAL LOW (ref 39.0–52.0)
Hemoglobin: 8.4 g/dL — ABNORMAL LOW (ref 13.0–17.0)
Immature Granulocytes: 1 %
Lymphocytes Relative: 33 %
Lymphs Abs: 1.2 10*3/uL (ref 0.7–4.0)
MCH: 30.7 pg (ref 26.0–34.0)
MCHC: 32.2 g/dL (ref 30.0–36.0)
MCV: 95.3 fL (ref 80.0–100.0)
Monocytes Absolute: 0.3 10*3/uL (ref 0.1–1.0)
Monocytes Relative: 7 %
Neutro Abs: 2.2 10*3/uL (ref 1.7–7.7)
Neutrophils Relative %: 59 %
Platelets: 250 10*3/uL (ref 150–400)
RBC: 2.74 MIL/uL — ABNORMAL LOW (ref 4.22–5.81)
RDW: 14 % (ref 11.5–15.5)
WBC: 3.8 10*3/uL — ABNORMAL LOW (ref 4.0–10.5)
nRBC: 0 % (ref 0.0–0.2)

## 2019-04-14 LAB — BLOOD CULTURE ID PANEL (REFLEXED)

## 2019-04-14 LAB — FIBRINOGEN: Fibrinogen: 670 mg/dL — ABNORMAL HIGH (ref 210–475)

## 2019-04-14 LAB — COMPREHENSIVE METABOLIC PANEL
ALT: 41 U/L (ref 0–44)
AST: 28 U/L (ref 15–41)
Albumin: 2.8 g/dL — ABNORMAL LOW (ref 3.5–5.0)
Alkaline Phosphatase: 82 U/L (ref 38–126)
Anion gap: 15 (ref 5–15)
BUN: 62 mg/dL — ABNORMAL HIGH (ref 6–20)
CO2: 9 mmol/L — ABNORMAL LOW (ref 22–32)
Calcium: 8 mg/dL — ABNORMAL LOW (ref 8.9–10.3)
Chloride: 114 mmol/L — ABNORMAL HIGH (ref 98–111)
Creatinine, Ser: 6.04 mg/dL — ABNORMAL HIGH (ref 0.61–1.24)
GFR calc Af Amer: 12 mL/min — ABNORMAL LOW (ref >60)
GFR calc non Af Amer: 10 mL/min — ABNORMAL LOW (ref >60)
Glucose, Bld: 220 mg/dL — ABNORMAL HIGH (ref 70–99)
Potassium: 4.1 mmol/L (ref 3.5–5.1)
Sodium: 138 mmol/L (ref 135–145)
Total Bilirubin: 0.7 mg/dL (ref 0.3–1.2)
Total Protein: 6.6 g/dL (ref 6.5–8.1)

## 2019-04-14 LAB — ABO/RH: ABO/RH(D): O POS

## 2019-04-14 LAB — URINALYSIS, ROUTINE W REFLEX MICROSCOPIC
Bilirubin Urine: NEGATIVE
Glucose, UA: 50 mg/dL — AB
Ketones, ur: NEGATIVE mg/dL
Leukocytes,Ua: NEGATIVE
Nitrite: NEGATIVE
Protein, ur: 30 mg/dL — AB
Specific Gravity, Urine: 1.008 (ref 1.005–1.030)
pH: 5 (ref 5.0–8.0)

## 2019-04-14 LAB — BASIC METABOLIC PANEL
Anion gap: 11 (ref 5–15)
BUN: 68 mg/dL — ABNORMAL HIGH (ref 6–20)
CO2: 13 mmol/L — ABNORMAL LOW (ref 22–32)
Calcium: 7.1 mg/dL — ABNORMAL LOW (ref 8.9–10.3)
Chloride: 114 mmol/L — ABNORMAL HIGH (ref 98–111)
Creatinine, Ser: 6.46 mg/dL — ABNORMAL HIGH (ref 0.61–1.24)
GFR calc Af Amer: 11 mL/min — ABNORMAL LOW (ref >60)
GFR calc non Af Amer: 10 mL/min — ABNORMAL LOW (ref >60)
Glucose, Bld: 198 mg/dL — ABNORMAL HIGH (ref 70–99)
Potassium: 4 mmol/L (ref 3.5–5.1)
Sodium: 138 mmol/L (ref 135–145)

## 2019-04-14 LAB — TRIGLYCERIDES: Triglycerides: 191 mg/dL — ABNORMAL HIGH (ref <150)

## 2019-04-14 LAB — C-REACTIVE PROTEIN: CRP: 9 mg/dL — ABNORMAL HIGH (ref <1.0)

## 2019-04-14 LAB — BRAIN NATRIURETIC PEPTIDE: B Natriuretic Peptide: 261.5 pg/mL — ABNORMAL HIGH (ref 0.0–100.0)

## 2019-04-14 LAB — GLUCOSE, CAPILLARY
Glucose-Capillary: 261 mg/dL — ABNORMAL HIGH (ref 70–99)
Glucose-Capillary: 269 mg/dL — ABNORMAL HIGH (ref 70–99)
Glucose-Capillary: 426 mg/dL — ABNORMAL HIGH (ref 70–99)
Glucose-Capillary: 450 mg/dL — ABNORMAL HIGH (ref 70–99)

## 2019-04-14 LAB — BETA-HYDROXYBUTYRIC ACID: Beta-Hydroxybutyric Acid: 0.91 mmol/L — ABNORMAL HIGH (ref 0.05–0.27)

## 2019-04-14 LAB — LACTATE DEHYDROGENASE: LDH: 167 U/L (ref 98–192)

## 2019-04-14 LAB — LACTIC ACID, PLASMA: Lactic Acid, Venous: 0.8 mmol/L (ref 0.5–1.9)

## 2019-04-14 LAB — FERRITIN: Ferritin: 551 ng/mL — ABNORMAL HIGH (ref 24–336)

## 2019-04-14 LAB — NA AND K (SODIUM & POTASSIUM), RAND UR
Potassium Urine: 16 mmol/L
Sodium, Ur: 72 mmol/L

## 2019-04-14 LAB — TROPONIN I (HIGH SENSITIVITY): Troponin I (High Sensitivity): 5 ng/L (ref <18)

## 2019-04-14 LAB — SEDIMENTATION RATE: Sed Rate: 86 mm/hr — ABNORMAL HIGH (ref 0–16)

## 2019-04-14 LAB — MRSA PCR SCREENING: MRSA by PCR: NEGATIVE

## 2019-04-14 LAB — PROCALCITONIN: Procalcitonin: 2.13 ng/mL

## 2019-04-14 LAB — D-DIMER, QUANTITATIVE: D-Dimer, Quant: 2.33 ug/mL-FEU — ABNORMAL HIGH (ref 0.00–0.50)

## 2019-04-14 LAB — STREP PNEUMONIAE URINARY ANTIGEN: Strep Pneumo Urinary Antigen: NEGATIVE

## 2019-04-14 IMAGING — DX ABDOMEN - 1 VIEW
2 series · 2 of 2 positions shown · non-contrast
Comparison: CT abdomen pelvis [DATE]

CLINICAL DATA: History of hydronephrosis.

EXAM:
ABDOMEN - 1 VIEW

[abdomen kub (1 of 2)]
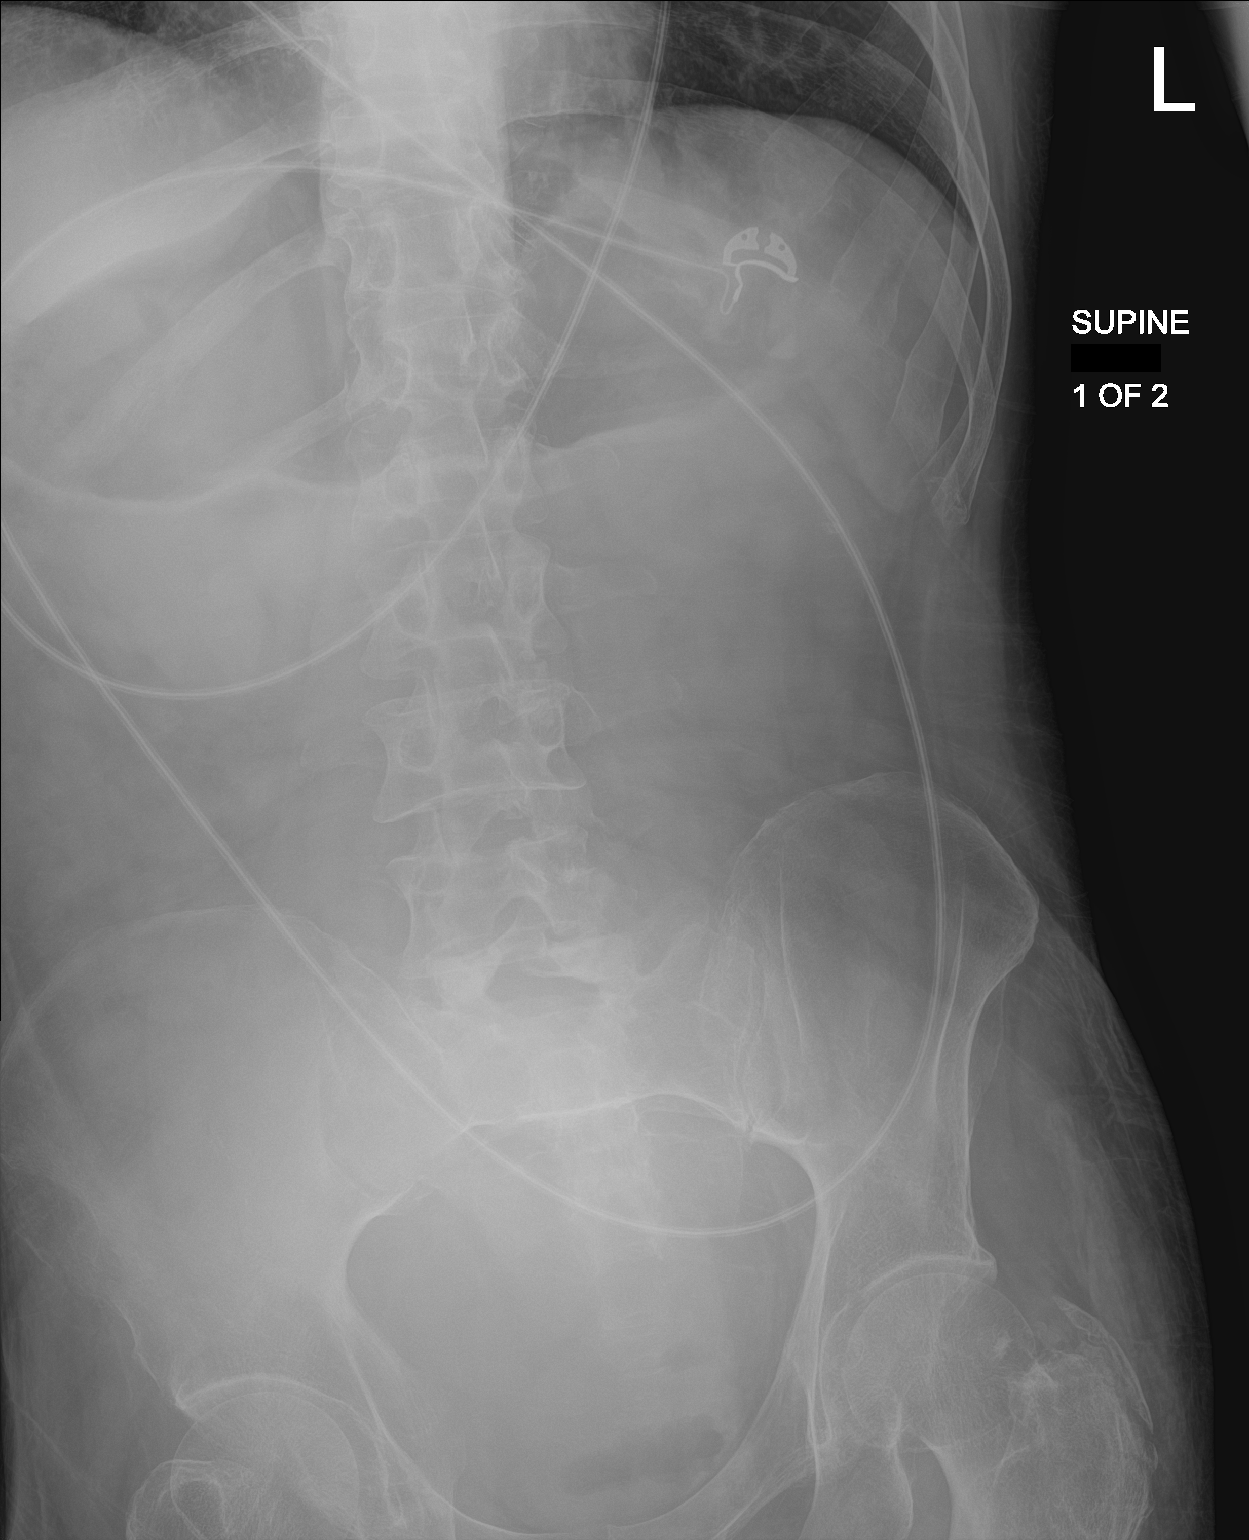

[abdomen kub (2 of 2)]
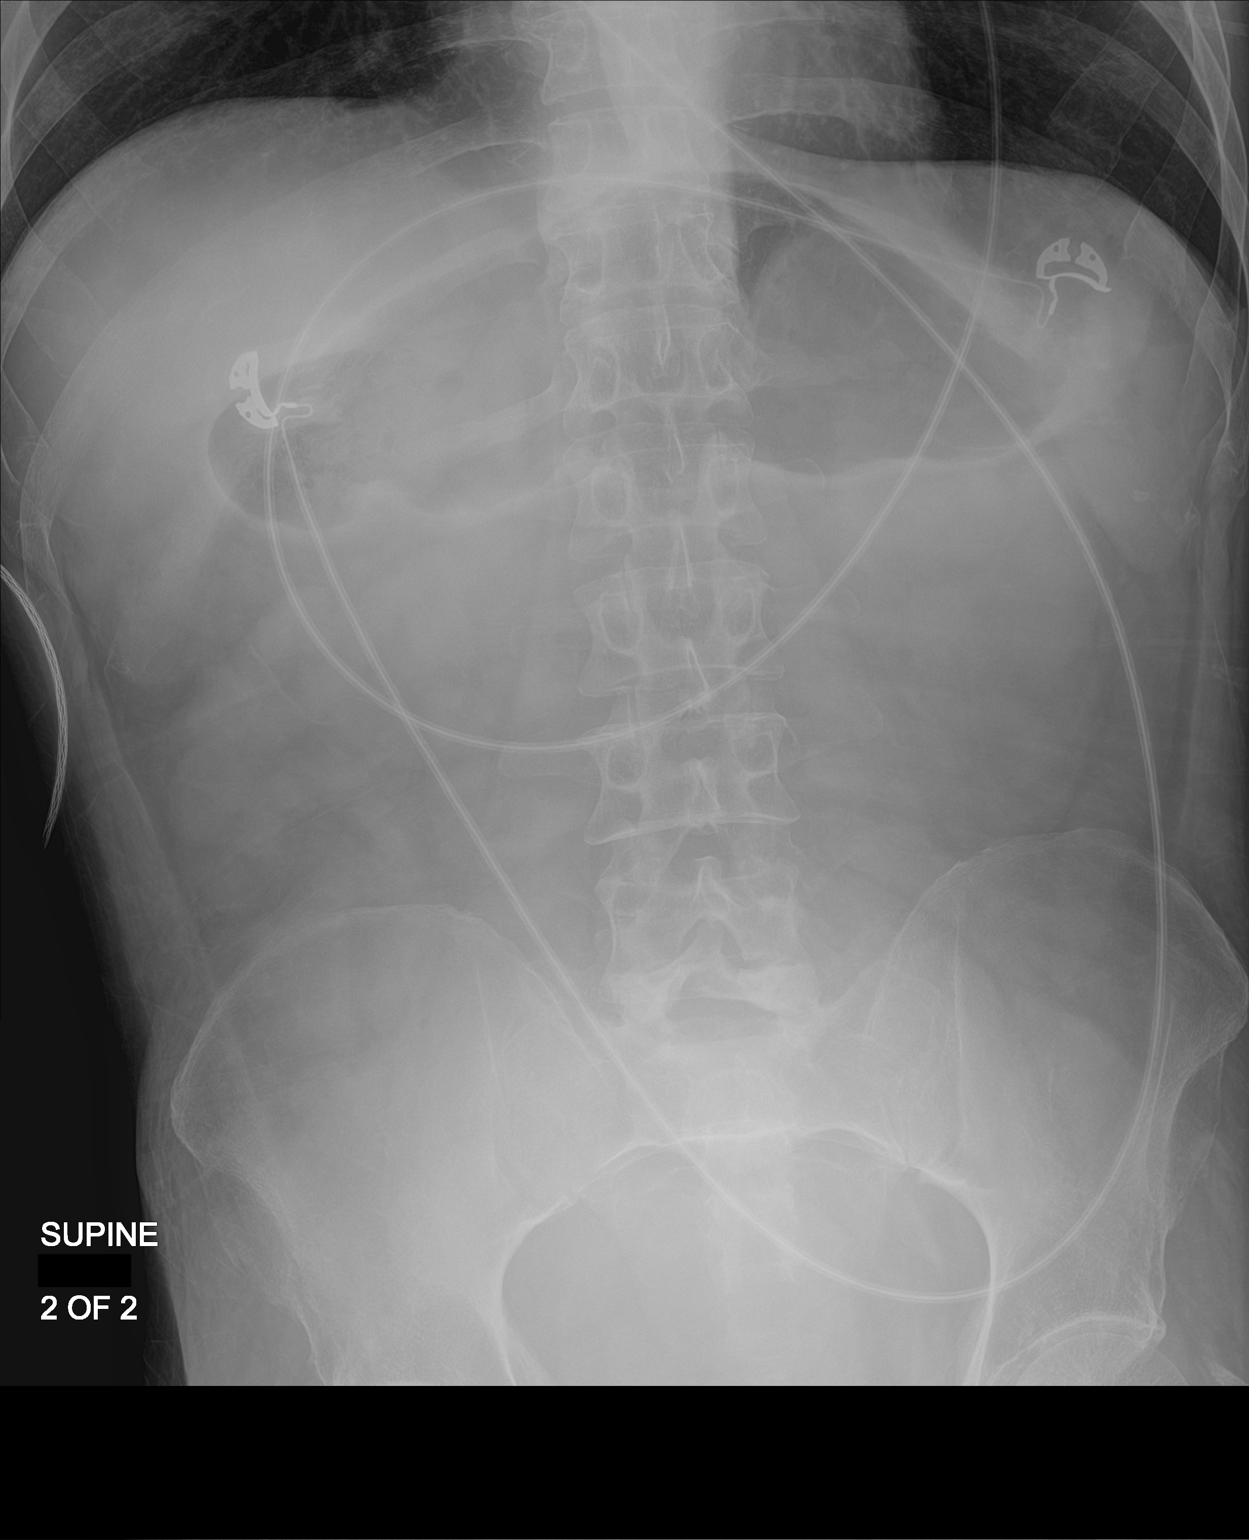

[2 of 2 positions shown; findings below may reference images not displayed]

FINDINGS: Paucity of small bowel gas. Gas within the stomach. Left lung bases
clear. Osseous structures unremarkable. Supine evaluation limited
for the detection of free intraperitoneal air. Chronic changes
proximal left femur/greater trochanter.
IMPRESSION: Paucity of small bowel gas without evidence for overt small bowel
obstruction.

## 2019-04-14 MED ORDER — INSULIN ASPART 100 UNIT/ML ~~LOC~~ SOLN: 0.0000 [IU] | Freq: Three times a day (TID) | SUBCUTANEOUS | Status: DC

## 2019-04-14 MED ORDER — OXYCODONE HCL 5 MG PO TABS
5.0000 mg | ORAL_TABLET | ORAL | Status: DC | PRN
  Administered 2019-04-15 – 2019-04-20 (×5): 5 mg via ORAL
  Filled 2019-04-14 (×5): qty 1

## 2019-04-14 MED ORDER — VITAMIN C 500 MG PO TABS
500.0000 mg | ORAL_TABLET | Freq: Every day | ORAL | Status: DC
  Administered 2019-04-14 – 2019-04-20 (×7): 500 mg via ORAL
  Filled 2019-04-14 (×7): qty 1

## 2019-04-14 MED ORDER — ONDANSETRON HCL 4 MG/2ML IJ SOLN
4.0000 mg | Freq: Four times a day (QID) | INTRAMUSCULAR | Status: DC | PRN
  Administered 2019-04-15 – 2019-04-16 (×2): 4 mg via INTRAVENOUS
  Filled 2019-04-14 (×3): qty 2

## 2019-04-14 MED ORDER — INSULIN ASPART PROT & ASPART (70-30 MIX) 100 UNIT/ML ~~LOC~~ SUSP
25.0000 [IU] | Freq: Every day | SUBCUTANEOUS | Status: DC
  Administered 2019-04-15 – 2019-04-16 (×2): 25 [IU] via SUBCUTANEOUS
  Filled 2019-04-14: qty 10

## 2019-04-14 MED ORDER — ONDANSETRON HCL 4 MG PO TABS
4.0000 mg | ORAL_TABLET | Freq: Four times a day (QID) | ORAL | Status: DC | PRN
  Administered 2019-04-15: 4 mg via ORAL
  Filled 2019-04-14: qty 1

## 2019-04-14 MED ORDER — ZINC SULFATE 220 (50 ZN) MG PO CAPS
220.0000 mg | ORAL_CAPSULE | Freq: Every day | ORAL | Status: DC
  Administered 2019-04-14 – 2019-04-20 (×7): 220 mg via ORAL
  Filled 2019-04-14 (×7): qty 1

## 2019-04-14 MED ORDER — INSULIN ASPART 100 UNIT/ML ~~LOC~~ SOLN: 7.0000 [IU] | Freq: Once | SUBCUTANEOUS | Status: DC

## 2019-04-14 MED ORDER — INSULIN ASPART 100 UNIT/ML ~~LOC~~ SOLN
10.0000 [IU] | Freq: Once | SUBCUTANEOUS | Status: AC
  Administered 2019-04-14: 10 [IU] via SUBCUTANEOUS

## 2019-04-14 MED ORDER — ADULT MULTIVITAMIN W/MINERALS CH
1.0000 | ORAL_TABLET | Freq: Every day | ORAL | Status: DC
  Administered 2019-04-14 – 2019-04-20 (×7): 1 via ORAL
  Filled 2019-04-14 (×7): qty 1

## 2019-04-14 MED ORDER — SODIUM CHLORIDE 0.9 % IV SOLN
500.0000 mg | INTRAVENOUS | Status: DC
  Administered 2019-04-14 – 2019-04-15 (×2): 500 mg via INTRAVENOUS
  Filled 2019-04-14 (×3): qty 500

## 2019-04-14 MED ORDER — ALBUTEROL SULFATE HFA 108 (90 BASE) MCG/ACT IN AERS
2.0000 | INHALATION_SPRAY | Freq: Four times a day (QID) | RESPIRATORY_TRACT | Status: DC
  Administered 2019-04-14 – 2019-04-20 (×17): 2 via RESPIRATORY_TRACT
  Filled 2019-04-14: qty 6.7

## 2019-04-14 MED ORDER — HEPARIN SODIUM (PORCINE) 5000 UNIT/ML IJ SOLN
5000.0000 [IU] | Freq: Three times a day (TID) | INTRAMUSCULAR | Status: DC
  Administered 2019-04-14 – 2019-04-20 (×17): 5000 [IU] via SUBCUTANEOUS
  Filled 2019-04-14 (×17): qty 1

## 2019-04-14 MED ORDER — STERILE WATER FOR INJECTION IV SOLN
INTRAVENOUS | Status: AC
  Administered 2019-04-14 – 2019-04-15 (×2): via INTRAVENOUS
  Filled 2019-04-14 (×4): qty 850

## 2019-04-14 MED ORDER — SODIUM CHLORIDE 0.9 % IV SOLN
1.0000 g | Freq: Once | INTRAVENOUS | Status: DC
  Filled 2019-04-14: qty 10

## 2019-04-14 MED ORDER — SODIUM CHLORIDE 0.9 % IV SOLN
Freq: Once | INTRAVENOUS | Status: AC
  Administered 2019-04-14: 07:00:00 via INTRAVENOUS

## 2019-04-14 MED ORDER — SODIUM CHLORIDE 0.9 % IV SOLN
2.0000 g | INTRAVENOUS | Status: DC
  Administered 2019-04-15: 2 g via INTRAVENOUS
  Filled 2019-04-14 (×2): qty 20
  Filled 2019-04-14: qty 2

## 2019-04-14 MED ORDER — SODIUM CHLORIDE 0.9 % IV SOLN
1.0000 g | INTRAVENOUS | Status: DC
  Administered 2019-04-14: 1 g via INTRAVENOUS
  Filled 2019-04-14: qty 1

## 2019-04-14 MED ORDER — INSULIN ASPART PROT & ASPART (70-30 MIX) 100 UNIT/ML ~~LOC~~ SUSP
35.0000 [IU] | Freq: Every day | SUBCUTANEOUS | Status: DC
  Administered 2019-04-14 – 2019-04-16 (×3): 35 [IU] via SUBCUTANEOUS
  Filled 2019-04-14: qty 10

## 2019-04-14 MED ORDER — GUAIFENESIN-DM 100-10 MG/5ML PO SYRP: 10.0000 mL | ORAL_SOLUTION | ORAL | Status: DC | PRN

## 2019-04-14 MED ORDER — ZOLPIDEM TARTRATE 5 MG PO TABS
5.0000 mg | ORAL_TABLET | Freq: Every evening | ORAL | Status: DC | PRN
  Administered 2019-04-16: 5 mg via ORAL
  Filled 2019-04-14: qty 1

## 2019-04-14 MED ORDER — POLYETHYLENE GLYCOL 3350 17 G PO PACK: 17.0000 g | PACK | Freq: Every day | ORAL | Status: DC | PRN

## 2019-04-14 MED ORDER — METHYLPREDNISOLONE SODIUM SUCC 40 MG IJ SOLR
30.0000 mg | Freq: Two times a day (BID) | INTRAMUSCULAR | Status: DC
  Administered 2019-04-14 – 2019-04-19 (×10): 30 mg via INTRAVENOUS
  Filled 2019-04-14 (×10): qty 1

## 2019-04-14 MED ORDER — ACETAMINOPHEN 325 MG PO TABS
650.0000 mg | ORAL_TABLET | Freq: Four times a day (QID) | ORAL | Status: DC | PRN
  Administered 2019-04-19: 650 mg via ORAL
  Filled 2019-04-14 (×3): qty 2

## 2019-04-14 MED ORDER — PANTOPRAZOLE SODIUM 40 MG PO TBEC
40.0000 mg | DELAYED_RELEASE_TABLET | Freq: Two times a day (BID) | ORAL | Status: DC
  Administered 2019-04-16 – 2019-04-20 (×8): 40 mg via ORAL
  Filled 2019-04-14 (×8): qty 1

## 2019-04-14 MED ORDER — SODIUM CHLORIDE 0.9 % IV SOLN: 2.0000 g | INTRAVENOUS | Status: DC

## 2019-04-14 MED ORDER — HYDROCOD POLST-CPM POLST ER 10-8 MG/5ML PO SUER: 5.0000 mL | Freq: Two times a day (BID) | ORAL | Status: DC | PRN

## 2019-04-14 MED ORDER — ASPIRIN EC 81 MG PO TBEC: 81.0000 mg | DELAYED_RELEASE_TABLET | Freq: Every day | ORAL | Status: DC

## 2019-04-14 MED ORDER — INSULIN ASPART 100 UNIT/ML ~~LOC~~ SOLN
0.0000 [IU] | Freq: Every day | SUBCUTANEOUS | Status: DC
  Administered 2019-04-17: 22:00:00 3 [IU] via SUBCUTANEOUS

## 2019-04-14 MED ORDER — SODIUM CHLORIDE 0.9 % IV SOLN
INTRAVENOUS | Status: DC
  Administered 2019-04-14 – 2019-04-15 (×2): via INTRAVENOUS

## 2019-04-14 MED ORDER — ASPIRIN EC 81 MG PO TBEC
81.0000 mg | DELAYED_RELEASE_TABLET | Freq: Every day | ORAL | Status: DC
  Administered 2019-04-14 – 2019-04-20 (×7): 81 mg via ORAL
  Filled 2019-04-14 (×7): qty 1

## 2019-04-14 NOTE — Progress Notes (Signed)
PHARMACY - PHYSICIAN COMMUNICATION CRITICAL VALUE ALERT - BLOOD CULTURE IDENTIFICATION (BCID)  Gregory Moody is an 43 y.o. male who was transferred to [facility] from [place] on 04/14/2019 with acute renal failure and COVID-19 positive for further evaluation and management.  Assessment:  The patient is being treated for CAP and was started on ceftriaxone and azithromycin on 7/3 at [facility].   Name of physician (or Provider) Contacted: Dr. Randa Spike  Current antibiotics: Ceftriaxone 1 gm IV q24h and Azithromycin 500 mg IV q 24 hr.  Changes to prescribed antibiotics recommended:  Based on the BCID results and the patient's status, it is recommended that the ceftriaxone is increased to 2 gms IV at this time and the patient is to continue on azithromycin 500 mg IV q 24 hr. An additional 1 gm IV will be given to complete the 2 gm dose tonight.  Results for orders placed or performed during the hospital encounter of 04/13/19  Blood Culture ID Panel (Reflexed) (Collected: 04/13/2019 11:15 PM)  Result Value Ref Range   Enterococcus species NOT DETECTED NOT DETECTED   Listeria monocytogenes NOT DETECTED NOT DETECTED   Staphylococcus species DETECTED (A) NOT DETECTED   Staphylococcus aureus (BCID) NOT DETECTED NOT DETECTED   Methicillin resistance NOT DETECTED NOT DETECTED   Streptococcus species NOT DETECTED NOT DETECTED   Streptococcus agalactiae NOT DETECTED NOT DETECTED   Streptococcus pneumoniae NOT DETECTED NOT DETECTED   Streptococcus pyogenes NOT DETECTED NOT DETECTED   Acinetobacter baumannii NOT DETECTED NOT DETECTED   Enterobacteriaceae species NOT DETECTED NOT DETECTED   Enterobacter cloacae complex NOT DETECTED NOT DETECTED   Escherichia coli NOT DETECTED NOT DETECTED   Klebsiella oxytoca NOT DETECTED NOT DETECTED   Klebsiella pneumoniae NOT DETECTED NOT DETECTED   Proteus species NOT DETECTED NOT DETECTED   Serratia marcescens NOT DETECTED NOT DETECTED   Haemophilus  influenzae NOT DETECTED NOT DETECTED   Neisseria meningitidis NOT DETECTED NOT DETECTED   Pseudomonas aeruginosa NOT DETECTED NOT DETECTED   Candida albicans NOT DETECTED NOT DETECTED   Candida glabrata NOT DETECTED NOT DETECTED   Candida krusei NOT DETECTED NOT DETECTED   Candida parapsilosis NOT DETECTED NOT DETECTED   Candida tropicalis NOT DETECTED NOT DETECTED    Blenda Nicely 04/14/2019  8:33 PM

## 2019-04-14 NOTE — Plan of Care (Signed)
  Problem: Health Behavior/Discharge Planning: Goal: Ability to manage health-related needs will improve Outcome: Progressing   Problem: Education: Goal: Knowledge of General Education information will improve Description: Including pain rating scale, medication(s)/side effects and non-pharmacologic comfort measures Outcome: Not Progressing   Problem: Education: Goal: Knowledge of General Education information will improve Description: Including pain rating scale, medication(s)/side effects and non-pharmacologic comfort measures Outcome: Not Progressing   Problem: Clinical Measurements: Goal: Ability to maintain clinical measurements within normal limits will improve Outcome: Not Progressing

## 2019-04-14 NOTE — ED Notes (Signed)
Pt stated to this RN that he no longer makes urine at this time.

## 2019-04-14 NOTE — H&P (Addendum)
History and Physical    Gregory Moody YNW:[id number] DOB: May 02, 1976 DOA: 04/14/2019  PCP: [facility]  Patient coming from: home in transfer for [place]  I have personally briefly reviewed patient's old medical records in [place]  Chief Complaint: N/V weakness for 2 days   HPI: Gregory Moody is a 43 y.o. male with medical history significant of diabetes mellitus type 2 with gastroparesis, frequent visits to the emergency department, history of nausea and vomiting who presents to the ER at [facility] with complaints of nausea, vomiting and generalized weakness.  Patient has been in and out of the hospital since mid May with dehydration, and nausea and vomiting due to his diabetic gastroparesis.  He reports he last took his insulin approximately 6 days prior to presentation at [facility].  EMS reports his initial blood pressure was low at 88/62 and improved on route with IV fluids he was given 4 mg of IV Zofran per EMS.  He denies fever cough chest pain shortness of breath abdominal pain diarrhea he has had no recent travel trauma or exposure or persons diagnosed with coronavirus.  Unfortunately despite that the patient tested positive for corona virus at [facility].  He was found to be in acute renal failure and transferred to our facility for further evaluation and management.  ED Course: Found to be in acute renal failure with a creatinine in the 7 range given 2 L of fluid creatinine improved to 6.4.  Due to positive coronavirus, acute renal failure patient transferred to [facility] for further evaluation and management.  Review of Systems: As per HPI otherwise all other systems reviewed and  negative.   Past Medical History:  Diagnosis Date  . Diabetes mellitus without complication ([place])   . Gastroparesis   . Tuberculosis     Past Surgical History:  Procedure Laterality Date  . ESOPHAGOGASTRODUODENOSCOPY N/A 02/03/2019   Procedure:  ESOPHAGOGASTRODUODENOSCOPY (EGD);  Surgeon: Toledo, Benay Pike, MD;  Location: [facility] ENDOSCOPY;  Service: Gastroenterology;  Laterality: N/A;  . NO PAST SURGERIES      Social History   Social History Narrative  . Not on file     reports that he has never smoked. He has never used smokeless tobacco. He reports that he does not drink alcohol or use drugs.  No Known Allergies  Family History  Problem Relation Age of Onset  . Diabetes Mother   . Diabetes Father     Prior to Admission medications   Medication Sig Start Date End Date Taking? Authorizing Provider  aspirin EC 81 MG tablet Take 81 mg by mouth daily.    [provider]  insulin NPH-regular Human (70-30) 100 UNIT/ML injection Inject 20 Units into the skin 2 (two) times daily with a meal. 25 units with breakfast in am and 35 units with supper 04/03/19   [name], MD  ondansetron (ZOFRAN) 4 MG tablet Take 1 tablet (4 mg total) by mouth every 6 (six) hours as needed for nausea. 04/03/19   [name], MD  pantoprazole (PROTONIX) 40 MG tablet Take 1 tablet (40 mg total) by mouth 2 (two) times daily before a meal. 04/03/19   [name], MD    Physical Exam:  Constitutional: Acutely and chronically ill-appearing, calm, comfortable Vitals:   04/14/19 1503 04/14/19 1534 04/14/19 1604 04/14/19 1739  BP: 91/65 106/73 110/69   Pulse: 80     Resp:      Temp:      TempSrc:  SpO2: 100% 100% 100% 100%  Weight:      Height:       Eyes: PERRL, lids and conjunctivae normal ENMT: Mucous membranes are dry. Posterior pharynx clear of any exudate or lesions.Normal dentition.  Neck: normal, supple, no masses, no thyromegaly Respiratory: clear to auscultation bilaterally, no wheezing, no crackles. Normal respiratory effort. No accessory muscle use.  Unable to auscultate any pulmonary abnormalities in the left lobe despite chest x-ray finding. Cardiovascular: Regular rate and rhythm, no murmurs  / rubs / gallops. No extremity edema. 2+ pedal pulses. No carotid bruits.  Abdomen: no tenderness, no masses palpated. No hepatosplenomegaly. Bowel sounds positive.  Musculoskeletal: no clubbing / cyanosis. No joint deformity upper and lower extremities. Good ROM, no contractures. Normal muscle tone.  Skin: no rashes, lesions, ulcers. No induration Neurologic: CN 2-12 grossly intact. Sensation intact, DTR normal. Strength 5/5 in all 4.  Psychiatric: Normal judgment and insight. Alert and oriented x 3. Normal mood.    Labs on Admission: I have personally reviewed following labs and imaging studies  CBC: Recent Labs  Lab 04/13/19 2138 04/14/19 1511  WBC 4.6 3.8*  NEUTROABS  --  2.2  HGB 9.1* 8.4*  HCT 27.8* 26.1*  MCV 93.0 95.3  PLT 273 654   Basic Metabolic Panel: Recent Labs  Lab 04/13/19 2138 04/14/19 0316 04/14/19 1511  NA 134* 138 138  K 4.3 4.0 4.1  CL 105 114* 114*  CO2 11* 13* 9*  GLUCOSE 347* 198* 220*  BUN 74* 68* 62*  CREATININE 7.75* 6.46* 6.04*  CALCIUM 8.0* 7.1* 8.0*   GFR: Estimated Creatinine Clearance: 12.8 mL/min (A) (by C-G formula based on SCr of 6.04 mg/dL (H)). Liver Function Tests: Recent Labs  Lab 04/13/19 2138 04/14/19 1511  AST 43* 28  ALT 51* 41  ALKPHOS 89 82  BILITOT 0.7 0.7  PROT 7.6 6.6  ALBUMIN 3.4* 2.8*   Recent Labs  Lab 04/13/19 2138  LIPASE 289*    CBG: Recent Labs  Lab 04/14/19 0126 04/14/19 1644  GLUCAP 261* 269*   Urine analysis:    Component Value Date/Time   COLORURINE STRAW (A) 04/02/2019 0942   APPEARANCEUR HAZY (A) 04/02/2019 0942   LABSPEC 1.012 04/02/2019 0942   PHURINE 5.0 04/02/2019 0942   GLUCOSEU 50 (A) 04/02/2019 0942   HGBUR SMALL (A) 04/02/2019 0942   BILIRUBINUR NEGATIVE 04/02/2019 0942   KETONESUR NEGATIVE 04/02/2019 0942   PROTEINUR NEGATIVE 04/02/2019 0942   NITRITE NEGATIVE 04/02/2019 0942   LEUKOCYTESUR NEGATIVE 04/02/2019 0942    Radiological Exams on Admission: Dg Abd 1 View   Result Date: 04/14/2019 CLINICAL DATA:  History of hydronephrosis. EXAM: ABDOMEN - 1 VIEW COMPARISON:  CT abdomen pelvis 03/17/2019 FINDINGS: Paucity of small bowel gas. Gas within the stomach. Left lung bases clear. Osseous structures unremarkable. Supine evaluation limited for the detection of free intraperitoneal air. Chronic changes proximal left femur/greater trochanter. IMPRESSION: Paucity of small bowel gas without evidence for overt small bowel obstruction. Electronically Signed   By: Lovey Newcomer M.D.   On: 04/14/2019 17:46   Dg Chest Port 1 View  Result Date: 04/13/2019 CLINICAL DATA:  Chest discomfort.  History of tuberculosis. EXAM: PORTABLE CHEST 1 VIEW COMPARISON:  March 30, 2019 FINDINGS: The nodular density in the right upper lung is stable and chronic. There is mild volume loss in the right upper lobe. The heart and mediastinum is stable. There is some increased opacity in the region of the left perihilar region, not seen  previously. No other changes. IMPRESSION: Suspected developing subtle infiltrate in the left perihilar region. A PA and lateral chest x-ray may better evaluate. Alternatively, recommend attention on follow-up. No other interval change. Electronically Signed   By: Dorise Bullion III M.D   On: 04/13/2019 22:55    EKG: Independently reviewed.  Sinus rhythm unchanged from 04/02/2019.  Assessment/Plan Principal Problem:   Acute renal failure (ARF) (HCC) Active Problems:   CAP (community acquired pneumonia) due to MSSA (methicillin sensitive Staphylococcus aureus) ([place])   COVID-19 virus infection   Diabetes mellitus with hyperglycemia ([place])   Diabetic gastroparesis ([place])   Nausea and vomiting   GERD (gastroesophageal reflux disease)   1.  Acute renal failure: -Likely due to prerenal secondary to severe dehydration -IVF -Check  US-renal -Follow up renal function by BMP -Avoid ACEI and NSAIDs-Likely due to prerenal secondary to severe dehydration -IVF Check bladder  scan when sure not related to acute urinary retention.  Check KUB.  2.  Community-acquired pneumonia Staph species (not aureus) coag (-). bacterial test positive for staph sens to keflex [(MAC-A neg) not MRSA] will give cefazolin 2 gm iv q8 h renally adjusted to 2 gm IV q12 hrs.    3.  COVID-19 virus infection: Even pneumonia IV steroids begun with Solu-Medrol 30 IV every 12 hours weight-based at 1 g/kg divided and given every 12.  4.  Diabetes mellitus with hyperglycemia: Patient has been off his insulin since Monday.  We will restart his home insulin.  He is not in DKA.  We will give him some IV fluids which should help as well.  Sliding scale insulin coverage with fingerstick blood glucoses before meals and at bedtime.  5.  Diabetic gastroparesis with nausea and vomiting: Antiemetics and IV fluids.  6.  Gastroesophageal reflux disease: Continue home medication management.  DVT prophylaxis: Q. heparin Code Status: Full code Family Communication: No family available patient retains capacity. Disposition Plan: Likely home in 3 to 4 days Consults called: Nephrology Admission status: Inpatient   Lady Deutscher MD FACP [facility] Pager [phone]  How to contact the [facility] Attending or Consulting provider [place] or covering provider during after hours [place], for this patient?  1. Check the care team in [facility] and look for a) attending/consulting [facility] provider listed and b) the [place] team listed 2. Log into [email or web address] and use [place]'s universal password to access. If you do not have the password, please contact the hospital operator. 3. Locate the [facility] provider you are looking for under [facility] and page to a number that you can be directly reached. 4. If you still have difficulty reaching the provider, please page the [facility] (Director on Call) for the Hospitalists listed on amion for assistance.  If 7PM-7AM, please contact night-coverage [email or web address] Password [facility]   04/14/2019, 6:39 PM

## 2019-04-14 NOTE — ED Provider Notes (Addendum)
Vitals:   04/14/19 0930 04/14/19 0943  BP: 121/81 121/81  Pulse: 76 76  Resp: 11 11  Temp:  98.4 F (36.9 C)  SpO2: 100% 100%   ----------------------------------------- 8:06 AM on 04/14/2019 -----------------------------------------  Patient fully alert.  Resting comfortably.  He understands plan for transfer and is agreeable with transfer to [facility].  He appears stable, no distress and appropriate for transfer for ongoing care at [place], MD 04/14/19 0806  ----------------------------------------- 9:46 AM on 04/14/2019 -----------------------------------------  Resting comfortably.  Appropriate and stable for transfer on reassessment.    Delman Kitten, MD 04/14/19 [phone]

## 2019-04-14 NOTE — ED Notes (Signed)
Blood culture results received and reported to [facility] regarding staphalococcus mec a not detected.

## 2019-04-14 NOTE — ED Notes (Signed)
emtala reviewed by this RN 

## 2019-04-14 NOTE — Consult Note (Addendum)
[facility] Nephrology Consultation Note  Requesting MD: Dr Apolinar Junes Reason for consult: AKI on CKD  HPI:  Gregory Moody is a 43 y.o. male with history of diabetes, diabetic gastroparesis, tuberculosis, CKD with baseline creatinine level around 1.5-2, transferred from [facility] to [facility] for AKI with COVID positive. Patient went to ER yesterday evening with complaint of nausea, vomiting, generalized weakness for 2 days.  On arrival he was hypotensive to 88/62 with elevated blood sugar level.  In the ER, labs showed creatinine level of 7.75, BUN 74, CO2 11, potassium 4.3.  He was treated with IV fluid and Zofran.  The lowest blood pressure reported 70s/ 56.  During phone discussion, patient reported that he is feeling slightly better.  He is dealing with diabetic gastroparesis.  He now he started urinating.  Denies dysuria, urgency, frequency.  Denies use of NSAIDs.  No IV contrast. The repeat serum creatinine level trended down to 6.46. Patient with frequent hospitalization for flare of diabetic gastroparesis.  He was admitted in May and June with episodes of AKI.  The AKI improved with IV fluid. I have discussed with Dr. Evangeline Gula, as per her physical exam: Patient is comfortable, dry mucous membrane, clear lungs, no lower extremity edema.  Her impression was patient is severely dehydrated.   PMHx:   Past Medical History:  Diagnosis Date  . Diabetes mellitus without complication ([place])   . Gastroparesis   . Tuberculosis     Past Surgical History:  Procedure Laterality Date  . ESOPHAGOGASTRODUODENOSCOPY N/A 02/03/2019   Procedure: ESOPHAGOGASTRODUODENOSCOPY (EGD);  Surgeon: Toledo, Benay Pike, MD;  Location: [facility] ENDOSCOPY;  Service: Gastroenterology;  Laterality: N/A;  . NO PAST SURGERIES      Family Hx:  Family History  Problem Relation Age of Onset  . Diabetes Mother   . Diabetes Father     Social History:  reports that he has never smoked. He  has never used smokeless tobacco. He reports that he does not drink alcohol or use drugs.  Allergies: No Known Allergies  Medications: Prior to Admission medications   Medication Sig Start Date End Date Taking? Authorizing Provider  aspirin EC 81 MG tablet Take 81 mg by mouth daily.    [provider]  insulin NPH-regular Human (70-30) 100 UNIT/ML injection Inject 20 Units into the skin 2 (two) times daily with a meal. 25 units with breakfast in am and 35 units with supper 04/03/19   [name], MD  ondansetron (ZOFRAN) 4 MG tablet Take 1 tablet (4 mg total) by mouth every 6 (six) hours as needed for nausea. 04/03/19   [name], MD  pantoprazole (PROTONIX) 40 MG tablet Take 1 tablet (40 mg total) by mouth 2 (two) times daily before a meal. 04/03/19   [name], MD    I have reviewed the patient's current medications.  Labs:  Results for orders placed or performed during the hospital encounter of 04/13/19 (from the past 48 hour(s))  Lipase, blood     Status: Abnormal   Collection Time: 04/13/19  9:38 PM  Result Value Ref Range   Lipase 289 (H) 11 - 51 U/L    Comment: Performed at [facility], [address] [place], [place] [address]  Comprehensive metabolic panel     Status: Abnormal   Collection Time: 04/13/19  9:38 PM  Result Value Ref Range   Sodium 134 (L) 135 - 145 mmol/L   Potassium 4.3 3.5 - 5.1 mmol/L   Chloride 105  98 - 111 mmol/L   CO2 11 (L) 22 - 32 mmol/L   Glucose, Bld 347 (H) 70 - 99 mg/dL   BUN 74 (H) 6 - 20 mg/dL   Creatinine, Ser 7.75 (H) 0.61 - 1.24 mg/dL   Calcium 8.0 (L) 8.9 - 10.3 mg/dL   Total Protein 7.6 6.5 - 8.1 g/dL   Albumin 3.4 (L) 3.5 - 5.0 g/dL   AST 43 (H) 15 - 41 U/L   ALT 51 (H) 0 - 44 U/L   Alkaline Phosphatase 89 38 - 126 U/L   Total Bilirubin 0.7 0.3 - 1.2 mg/dL   GFR calc non Af Amer 8 (L) >60 mL/min   GFR calc Af Amer 9 (L) >60 mL/min   Anion gap 18 (H) 5 - 15    Comment: Performed  at [facility], [address] [place], [place] [address]  CBC     Status: Abnormal   Collection Time: 04/13/19  9:38 PM  Result Value Ref Range   WBC 4.6 4.0 - 10.5 K/uL   RBC 2.99 (L) 4.22 - 5.81 MIL/uL   Hemoglobin 9.1 (L) 13.0 - 17.0 g/dL   HCT 27.8 (L) 39.0 - 52.0 %   MCV 93.0 80.0 - 100.0 fL   MCH 30.4 26.0 - 34.0 pg   MCHC 32.7 30.0 - 36.0 g/dL   RDW 14.0 11.5 - 15.5 %   Platelets 273 150 - 400 K/uL   nRBC 0.0 0.0 - 0.2 %    Comment: Performed at [facility], [place], [place] [address]  Troponin I (High Sensitivity)     Status: None   Collection Time: 04/13/19  9:38 PM  Result Value Ref Range   Troponin I (High Sensitivity) 8 <18 ng/L    Comment: (NOTE) Elevated high sensitivity troponin I (hsTnI) values and significant  changes across serial measurements may suggest ACS but many other  chronic and acute conditions are known to elevate hsTnI results.  Refer to the "Links" section for chest pain algorithms and additional  guidance. Performed at [facility], [address] [place], [place] [address]   Beta-hydroxybutyric acid     Status: Abnormal   Collection Time: 04/13/19  9:38 PM  Result Value Ref Range   Beta-Hydroxybutyric Acid 0.91 (H) 0.05 - 0.27 mmol/L    Comment: Performed at [facility], [address] [place], [place] [address]  Blood gas, venous     Status: Abnormal   Collection Time: 04/13/19  9:39 PM  Result Value Ref Range   pH, Ven 7.25 7.250 - 7.430   pCO2, Ven 33 (L) 44.0 - 60.0 mmHg   pO2, Ven <31.0 (LL) 32.0 - 45.0 mmHg    Comment: CRITICAL RESULT CALLED TO, READ BACK BY AND VERIFIED WITH: KAYLEI, RN AT 2202 ON 04/13/19 [facility], RT    Bicarbonate 14.5 (L) 20.0 - 28.0 mmol/L   Acid-base deficit 11.6 (H) 0.0 - 2.0 mmol/L   O2 Saturation 45.3 %   Patient temperature 37.0    Collection site VEIN    Sample type VENOUS     Comment: Performed at [facility], [address],  [place], [place] [address]  SARS Coronavirus 2 ([place]- Performed in [place] hospital lab), [facility] Order     Status: Abnormal   Collection Time: 04/13/19 10:02 PM   Specimen: Nasopharyngeal Swab  Result Value Ref Range   SARS Coronavirus 2 POSITIVE (A) NEGATIVE    Comment: RESULT CALLED  TO, READ BACK BY AND VERIFIED WITH: RACHEL HAYDEN ON 04/13/2019 AT 2256 QSD (NOTE) If result is NEGATIVE SARS-CoV-2 target nucleic acids are NOT DETECTED. The SARS-CoV-2 RNA is generally detectable in upper and lower  respiratory specimens during the acute phase of infection. The lowest  concentration of SARS-CoV-2 viral copies this assay can detect is 250  copies / mL. A negative result does not preclude SARS-CoV-2 infection  and should not be used as the sole basis for treatment or other  patient management decisions.  A negative result may occur with  improper specimen collection / handling, submission of specimen other  than nasopharyngeal swab, presence of viral mutation(s) within the  areas targeted by this assay, and inadequate number of viral copies  (<250 copies / mL). A negative result must be combined with clinical  observations, patient history, and epidemiological information. If result is POSITIVE SARS-CoV-2 target nucleic acids are DETECTED.  The SARS-CoV-2 RNA is generally detectable in upper and lower  respiratory specimens during the acute phase of infection.  Positive  results are indicative of active infection with SARS-CoV-2.  Clinical  correlation with patient history and other diagnostic information is  necessary to determine patient infection status.  Positive results do  not rule out bacterial infection or co-infection with other viruses. If result is PRESUMPTIVE POSTIVE SARS-CoV-2 nucleic acids MAY BE PRESENT.   A presumptive positive result was obtained on the submitted specimen  and confirmed on repeat testing.  While 2019 novel coronavirus  (SARS-CoV-2) nucleic acids may be  present in the submitted sample  additional confirmatory testing may be necessary for epidemiological  and / or clinical management purposes  to differentiate between  SARS-CoV-2 and other Sarbecovirus currently known to infect humans.  If clinically indicated additional testing with an alternate test  methodology [phone])  is advised. The SARS-CoV-2 RNA is generally  detectable in upper and lower respiratory specimens during the acute  phase of infection. The expected result is Negative. Fact Sheet for Patients:  StrictlyIdeas.no Fact Sheet for Healthcare Providers: BankingDealers.co.za This test is not yet approved or cleared by the [place] FDA and has been authorized for detection and/or diagnosis of SARS-CoV-2 by FDA under an Emergency Use Authorization (EUA).  This EUA will remain in effect (meaning this test can be used) for the duration of the COVID-19 declaration under Section 564(b)(1) of the Act, 21 U.S.C. section 360bbb-3(b)(1), unless the authorization is terminated or revoked sooner. Performed at [facility], [address] [place], [place] [address]   Lactic acid, plasma     Status: None   Collection Time: 04/13/19 11:15 PM  Result Value Ref Range   Lactic Acid, Venous 0.8 0.5 - 1.9 mmol/L    Comment: Performed at [facility], [address] [place], [place] [address]  Culture, blood (routine x 2)     Status: None (Preliminary result)   Collection Time: 04/13/19 11:15 PM   Specimen: BLOOD  Result Value Ref Range   Specimen Description BLOOD LEFT ANTECUBITAL    Special Requests      BOTTLES DRAWN AEROBIC AND ANAEROBIC Blood Culture adequate volume   Culture      NO GROWTH < 12 HOURS Performed at [facility], [address] [place], [place] [address]    Report Status PENDING   Culture, blood (routine x 2)     Status: None (Preliminary result)   Collection Time: 04/13/19 11:15  PM   Specimen: BLOOD  Result Value Ref  Range   Specimen Description BLOOD LEFT FOREARM    Special Requests      BOTTLES DRAWN AEROBIC AND ANAEROBIC Blood Culture adequate volume   Culture      NO GROWTH < 12 HOURS Performed at [facility], [address] [place], [place] [address]    Report Status PENDING   Glucose, capillary     Status: Abnormal   Collection Time: 04/14/19  1:26 AM  Result Value Ref Range   Glucose-Capillary 261 (H) 70 - 99 mg/dL  Basic metabolic panel     Status: Abnormal   Collection Time: 04/14/19  3:16 AM  Result Value Ref Range   Sodium 138 135 - 145 mmol/L   Potassium 4.0 3.5 - 5.1 mmol/L   Chloride 114 (H) 98 - 111 mmol/L   CO2 13 (L) 22 - 32 mmol/L   Glucose, Bld 198 (H) 70 - 99 mg/dL   BUN 68 (H) 6 - 20 mg/dL   Creatinine, Ser 6.46 (H) 0.61 - 1.24 mg/dL   Calcium 7.1 (L) 8.9 - 10.3 mg/dL   GFR calc non Af Amer 10 (L) >60 mL/min   GFR calc Af Amer 11 (L) >60 mL/min   Anion gap 11 5 - 15    Comment: Performed at [facility], [address] [place], [place] [address]     ROS:  Pertinent items noted in HPI and remainder of comprehensive ROS otherwise negative.  Physical Exam: Vitals:   04/14/19 1103 04/14/19 1503  BP: 125/85 91/65  Pulse: 73 80  Resp: 12   Temp: 97.9 F (36.6 C)   SpO2: 100% 100%     Due to the nature of this patient's positive COVID-19 with isolation and in keeping with efforts to prevent the spread of infection and to conserve personal protective equipment, a physical exam was not personally performed. Patient's symptoms and exam were discussed in detail with Dr. Evangeline Gula. A chart review of other providers notes and the patient's lab work as well as review of other pertinent studies was performed. Exam details from prior documentation were reviewed specifically and confirmed with the bedside nurse.  Location of service: [facility].  Assessment/Plan:  #Acute kidney injury likely  hemodynamically mediated in the setting of severe dehydration, hypotension due to nausea, vomiting and decreased oral intake, +covid.  Patient has history of diabetic gastroparesis. -The recent urinalysis with no protein or cells.  I will repeat urinalysis, bladder scan and check ultrasound kidneys to rule out obstruction. -I do not think patient has uremia, I recommend to continue IV fluid, add sodium bicarbonate to help metabolic acidosis.  Potassium level acceptable. -Check repeat labs to monitor kidney function. -Avoid nephrotoxins.  Strict ins and out for monitoring urine output. -No indication for dialysis.  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  Recent baseline creatinine level around 1.5-2.  #Anemia: Check iron studies.  Monitor CBC.  #COVID 19 +: Per primary team.  Thank you for the consult.  We will follow with you.  Dron Tanna Furry 04/14/2019, 3:24 PM  [facility].

## 2019-04-14 NOTE — Progress Notes (Signed)
Notified by pharmacist that initial review of antibiotic appropriateness reconsidered and plan is for Rocephin see orders.

## 2019-04-15 ENCOUNTER — Inpatient Hospital Stay (HOSPITAL_COMMUNITY): Payer: HRSA Program

## 2019-04-15 LAB — CK: Total CK: 175 U/L (ref 49–397)

## 2019-04-15 LAB — CBC WITH DIFFERENTIAL/PLATELET
Abs Immature Granulocytes: 0.01 10*3/uL (ref 0.00–0.07)
Basophils Absolute: 0 10*3/uL (ref 0.0–0.1)
Basophils Relative: 0 %
Eosinophils Absolute: 0 10*3/uL (ref 0.0–0.5)
Eosinophils Relative: 0 %
HCT: 24.6 % — ABNORMAL LOW (ref 39.0–52.0)
Hemoglobin: 8.4 g/dL — ABNORMAL LOW (ref 13.0–17.0)
Immature Granulocytes: 0 %
Lymphocytes Relative: 11 %
Lymphs Abs: 0.4 10*3/uL — ABNORMAL LOW (ref 0.7–4.0)
MCH: 30.9 pg (ref 26.0–34.0)
MCHC: 34.1 g/dL (ref 30.0–36.0)
MCV: 90.4 fL (ref 80.0–100.0)
Monocytes Absolute: 0.1 10*3/uL (ref 0.1–1.0)
Monocytes Relative: 2 %
Neutro Abs: 3.3 10*3/uL (ref 1.7–7.7)
Neutrophils Relative %: 87 %
Platelets: 304 10*3/uL (ref 150–400)
RBC: 2.72 MIL/uL — ABNORMAL LOW (ref 4.22–5.81)
RDW: 13.8 % (ref 11.5–15.5)
WBC: 3.8 10*3/uL — ABNORMAL LOW (ref 4.0–10.5)
nRBC: 0 % (ref 0.0–0.2)

## 2019-04-15 LAB — COMPREHENSIVE METABOLIC PANEL
ALT: 33 U/L (ref 0–44)
AST: 23 U/L (ref 15–41)
Albumin: 2.8 g/dL — ABNORMAL LOW (ref 3.5–5.0)
Alkaline Phosphatase: 85 U/L (ref 38–126)
Anion gap: 15 (ref 5–15)
BUN: 55 mg/dL — ABNORMAL HIGH (ref 6–20)
CO2: 16 mmol/L — ABNORMAL LOW (ref 22–32)
Calcium: 8.2 mg/dL — ABNORMAL LOW (ref 8.9–10.3)
Chloride: 107 mmol/L (ref 98–111)
Creatinine, Ser: 4.45 mg/dL — ABNORMAL HIGH (ref 0.61–1.24)
GFR calc Af Amer: 17 mL/min — ABNORMAL LOW (ref >60)
GFR calc non Af Amer: 15 mL/min — ABNORMAL LOW (ref >60)
Glucose, Bld: 198 mg/dL — ABNORMAL HIGH (ref 70–99)
Potassium: 3.7 mmol/L (ref 3.5–5.1)
Sodium: 138 mmol/L (ref 135–145)
Total Bilirubin: 0.6 mg/dL (ref 0.3–1.2)
Total Protein: 6.6 g/dL (ref 6.5–8.1)

## 2019-04-15 LAB — GLUCOSE, CAPILLARY
Glucose-Capillary: 426 mg/dL — ABNORMAL HIGH (ref 70–99)
Glucose-Capillary: 246 mg/dL — ABNORMAL HIGH (ref 70–99)
Glucose-Capillary: 184 mg/dL — ABNORMAL HIGH (ref 70–99)
Glucose-Capillary: 140 mg/dL — ABNORMAL HIGH (ref 70–99)
Glucose-Capillary: 151 mg/dL — ABNORMAL HIGH (ref 70–99)
Glucose-Capillary: 114 mg/dL — ABNORMAL HIGH (ref 70–99)

## 2019-04-15 LAB — BASIC METABOLIC PANEL
Anion gap: 16 — ABNORMAL HIGH (ref 5–15)
BUN: 62 mg/dL — ABNORMAL HIGH (ref 6–20)
CO2: 9 mmol/L — ABNORMAL LOW (ref 22–32)
Calcium: 8 mg/dL — ABNORMAL LOW (ref 8.9–10.3)
Chloride: 108 mmol/L (ref 98–111)
Creatinine, Ser: 5.55 mg/dL — ABNORMAL HIGH (ref 0.61–1.24)
GFR calc Af Amer: 13 mL/min — ABNORMAL LOW (ref >60)
GFR calc non Af Amer: 12 mL/min — ABNORMAL LOW (ref >60)
Glucose, Bld: 553 mg/dL (ref 70–99)
Potassium: 4.5 mmol/L (ref 3.5–5.1)
Sodium: 133 mmol/L — ABNORMAL LOW (ref 135–145)

## 2019-04-15 LAB — GLUCOSE 6 PHOSPHATE DEHYDROGENASE
G6PDH: 8 U/g{Hb} (ref 4.6–13.5)
Hemoglobin: 8.2 g/dL — ABNORMAL LOW (ref 13.0–17.7)

## 2019-04-15 LAB — IRON AND TIBC
Iron: 31 ug/dL — ABNORMAL LOW (ref 45–182)
Saturation Ratios: 14 % — ABNORMAL LOW (ref 17.9–39.5)
TIBC: 220 ug/dL — ABNORMAL LOW (ref 250–450)
UIBC: 189 ug/dL

## 2019-04-15 LAB — PROTEIN / CREATININE RATIO, URINE
Creatinine, Urine: 26.62 mg/dL
Protein Creatinine Ratio: 0.94 mg/mg{Cre} — ABNORMAL HIGH (ref 0.00–0.15)
Total Protein, Urine: 25 mg/dL

## 2019-04-15 LAB — FERRITIN: Ferritin: 613 ng/mL — ABNORMAL HIGH (ref 24–336)

## 2019-04-15 LAB — MAGNESIUM: Magnesium: 1.9 mg/dL (ref 1.7–2.4)

## 2019-04-15 LAB — TRIGLYCERIDES: Triglycerides: 78 mg/dL (ref <150)

## 2019-04-15 LAB — D-DIMER, QUANTITATIVE: D-Dimer, Quant: 2.45 ug/mL-FEU — ABNORMAL HIGH (ref 0.00–0.50)

## 2019-04-15 LAB — PHOSPHORUS: Phosphorus: 4.6 mg/dL (ref 2.5–4.6)

## 2019-04-15 LAB — C-REACTIVE PROTEIN: CRP: 8.1 mg/dL — ABNORMAL HIGH (ref <1.0)

## 2019-04-15 IMAGING — DX PORTABLE CHEST - 1 VIEW
1 series · 1 of 1 positions shown · non-contrast
Comparison: Chest x-ray dated [DATE].

CLINICAL DATA: Shortness of breath.  [64] positive.

EXAM:
PORTABLE CHEST 1 VIEW

[chest ap]
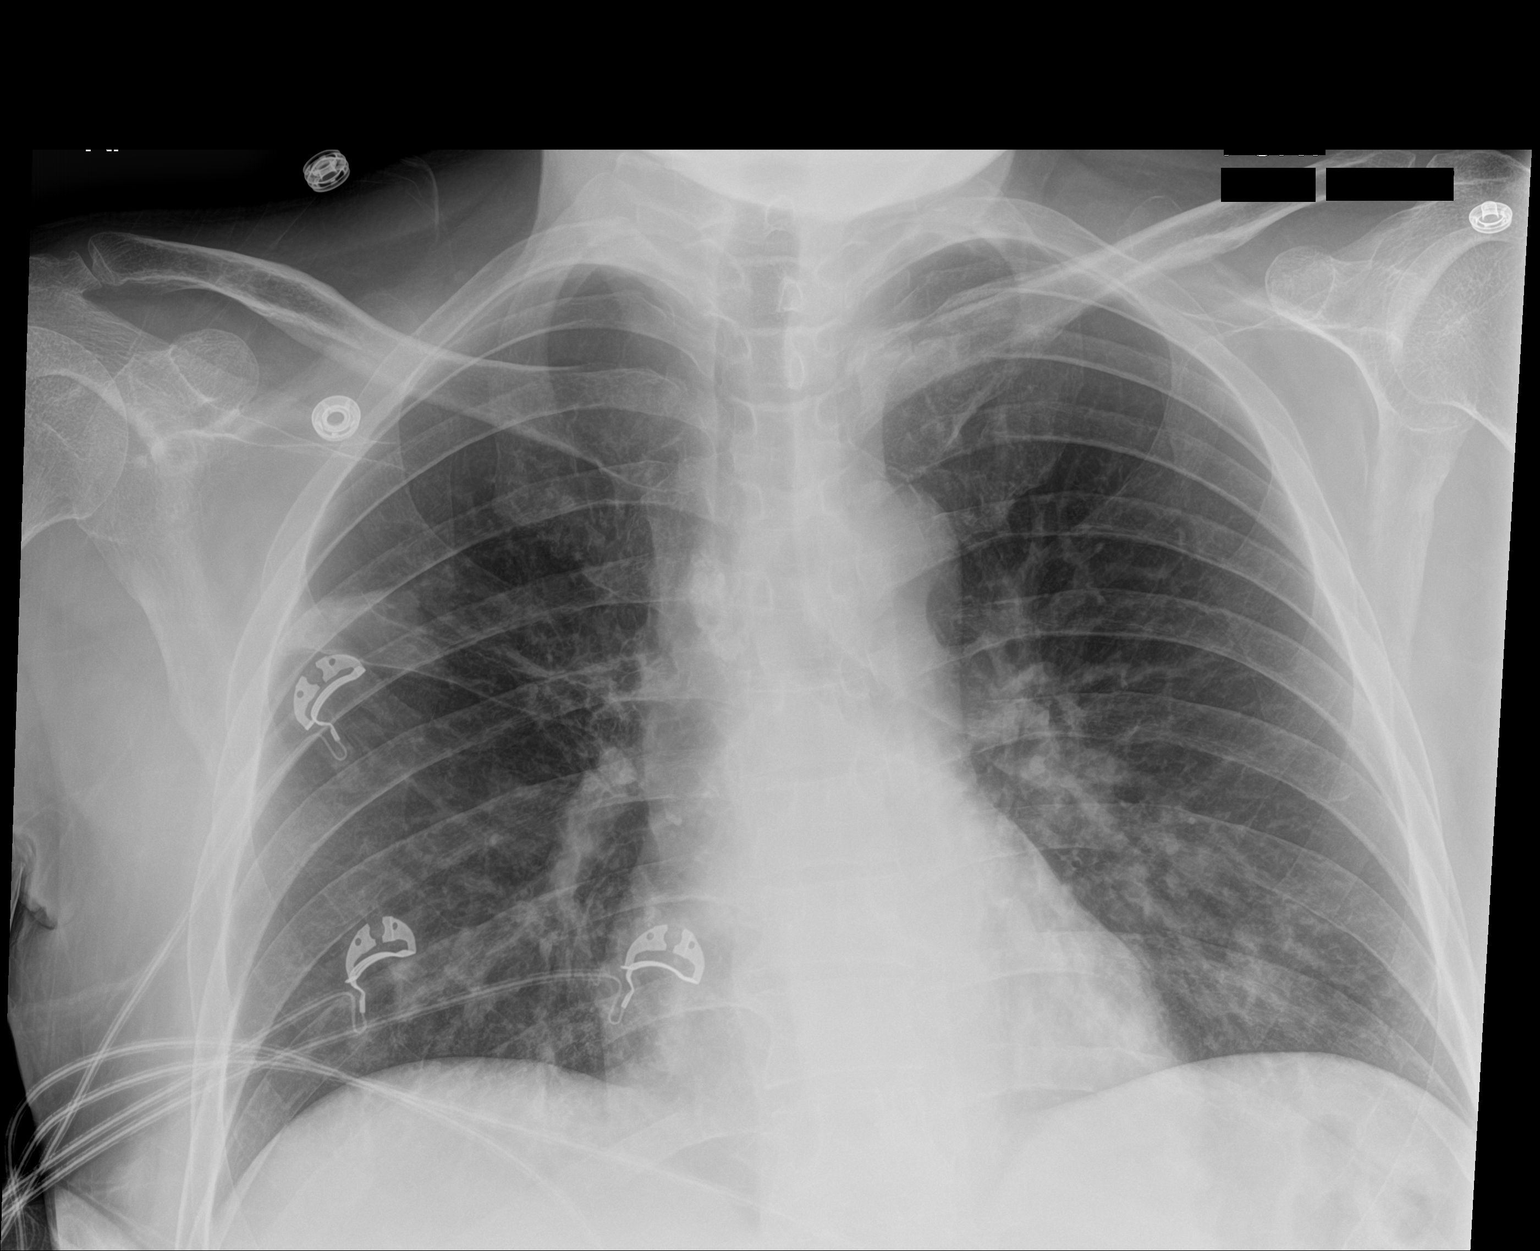

[1 of 1 positions shown; findings below may reference images not displayed]

FINDINGS: The heart size and mediastinal contours are within normal limits.
Normal pulmonary vascularity. Mild patchy opacity at the left
greater than right lung bases, more prominent when compared to prior
study. No pleural effusion or pneumothorax. Chronic masslike opacity
in the peripheral right upper lobe is unchanged.
IMPRESSION: 1. Mild bibasilar patchy opacities, likely reflecting [64]
pneumonia.

## 2019-04-15 IMAGING — US US RENAL
1 series · 14 of 23 positions shown · non-contrast
Comparison: CT abdomen [DATE]

CLINICAL DATA: Acute kidney injury.

EXAM:
RENAL / URINARY TRACT ULTRASOUND COMPLETE

[Series 1: us renal · 14 of 23 slices shown]
[im 1/23]
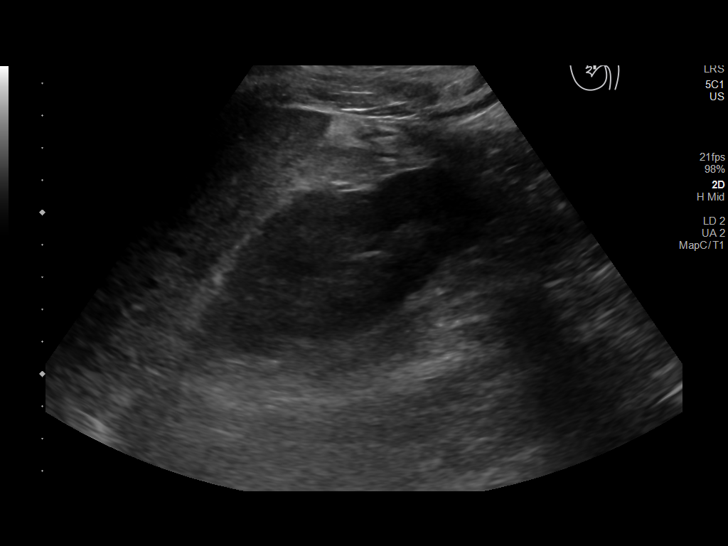
[im 3/23]
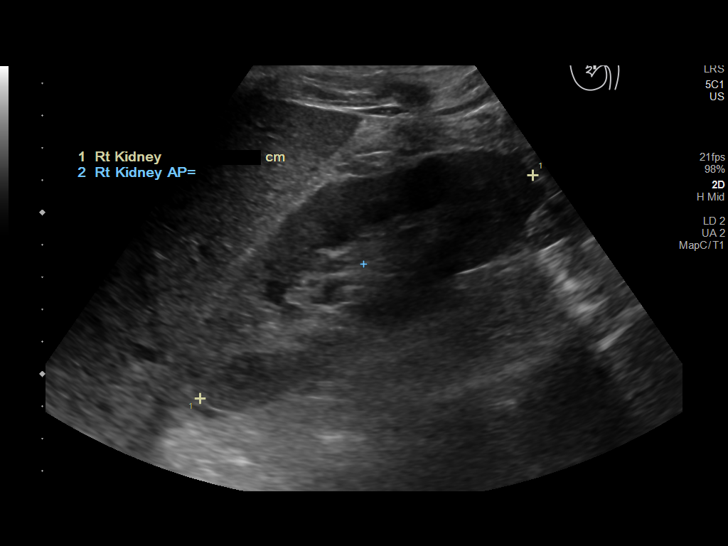
[im 5/23]
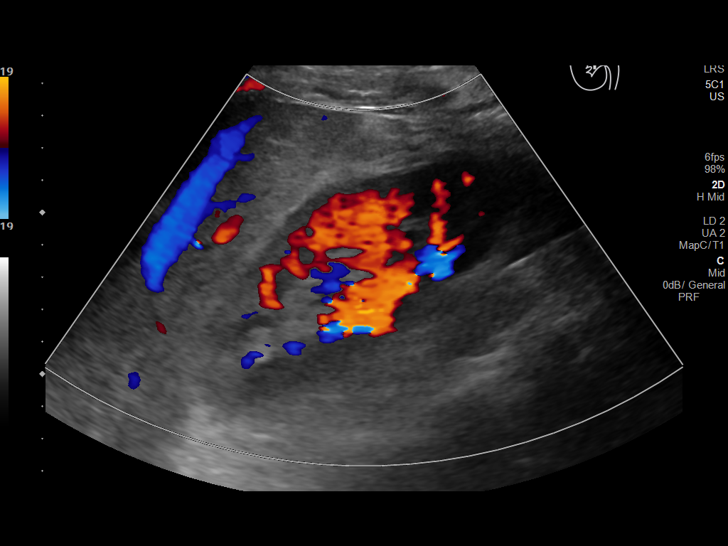
[im 6/23]
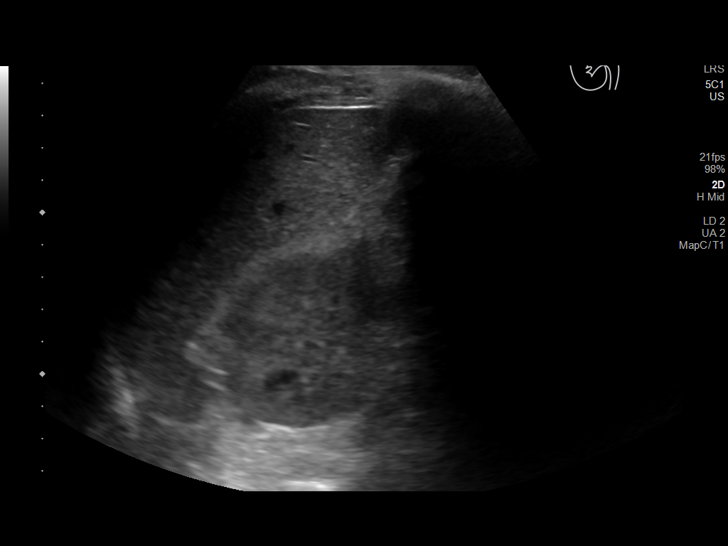
[im 8/23]
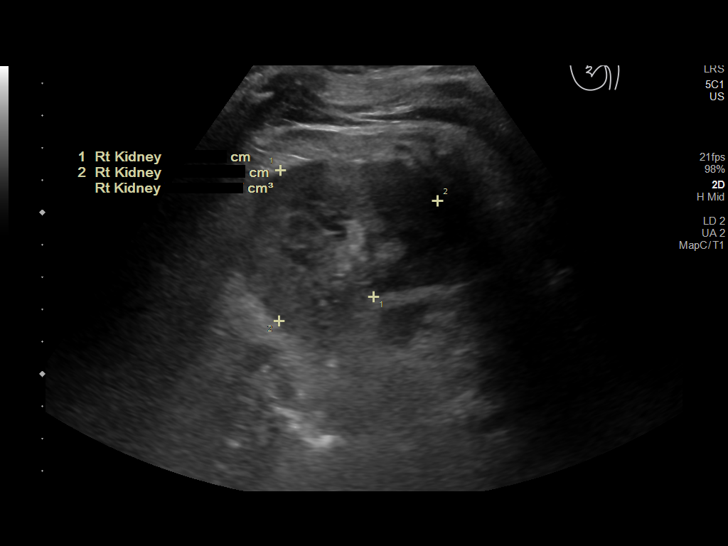
[im 10/23]
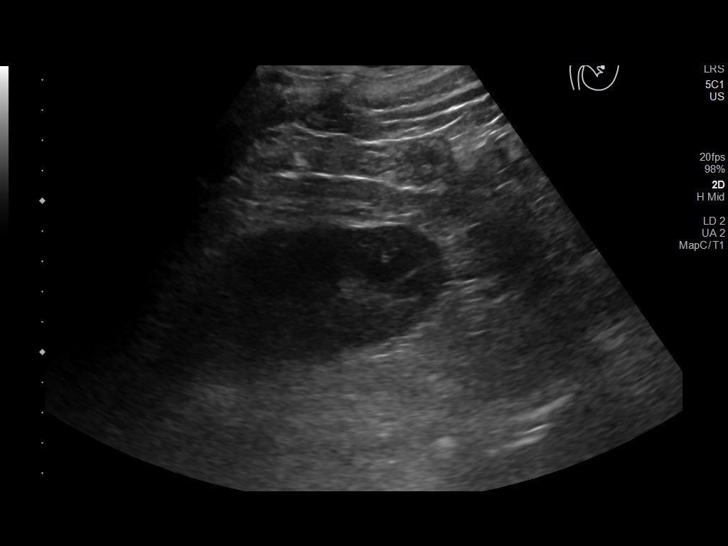
[im 11/23]
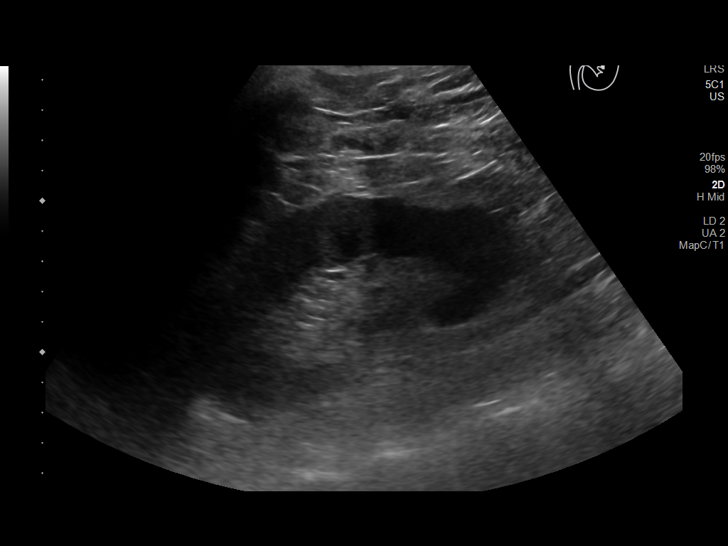
[im 13/23]
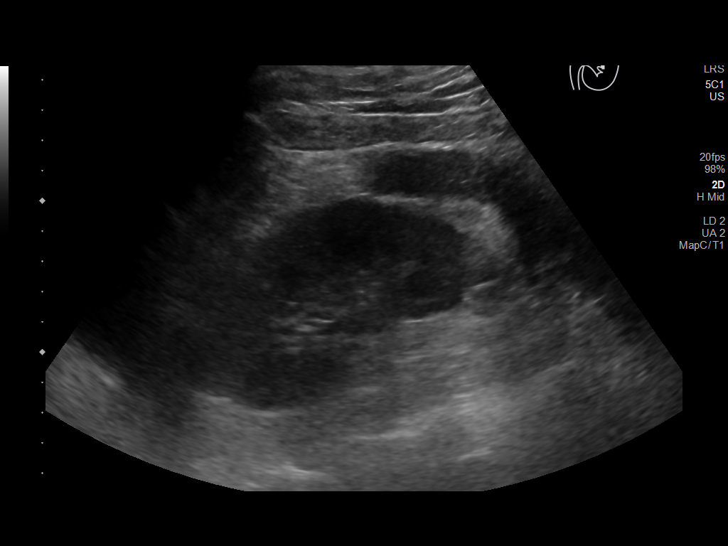
[im 14/23]
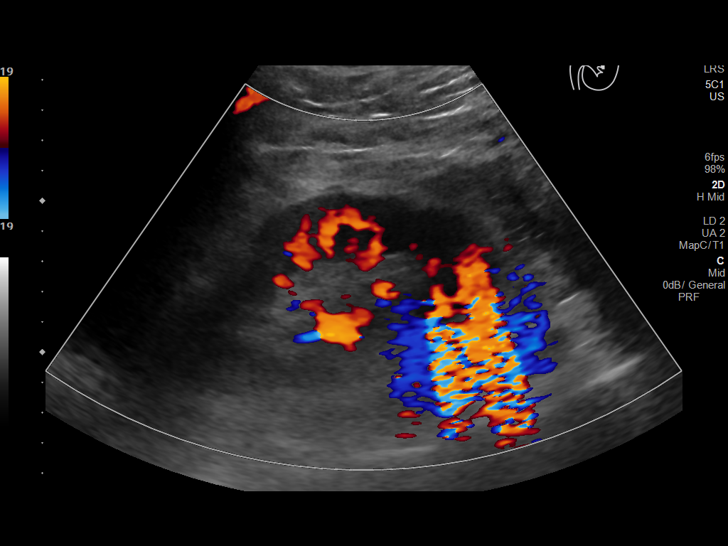
[im 16/23]
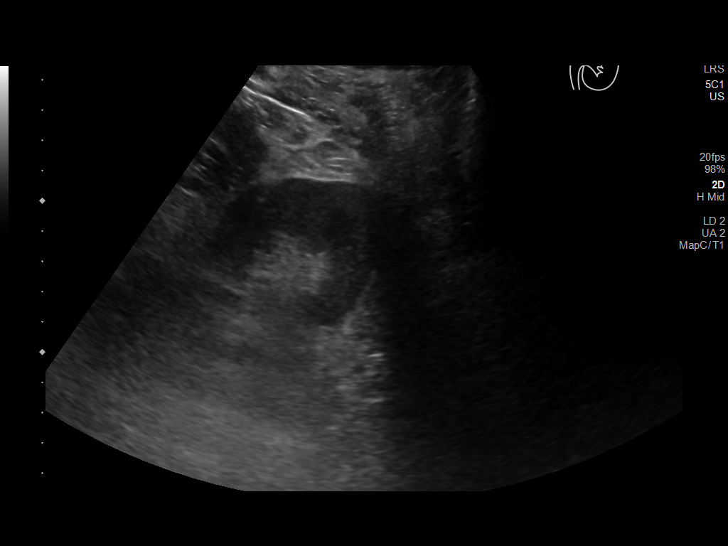
[im 18/23]
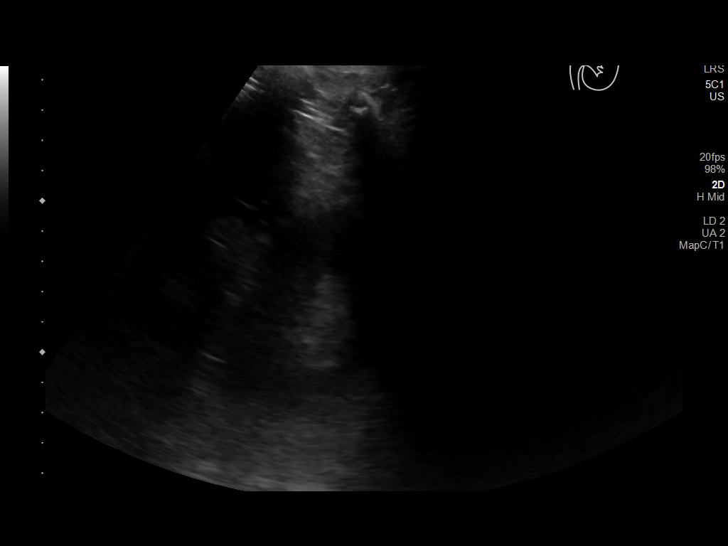
[im 19/23]
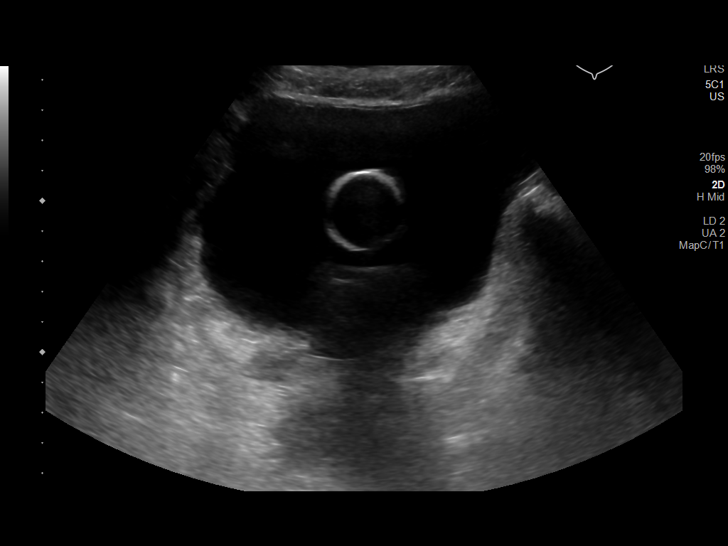
[im 21/23]
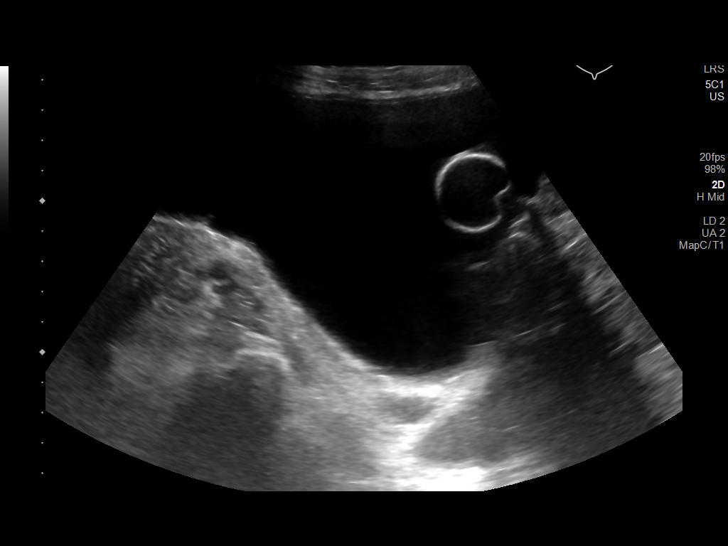
[im 23/23]
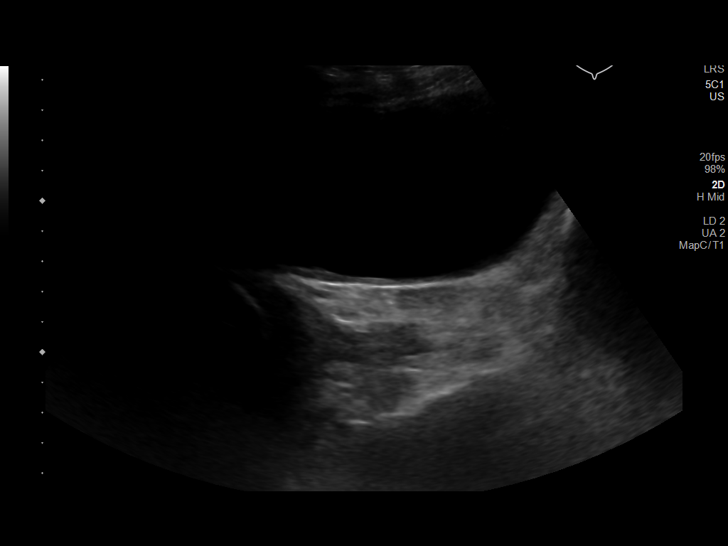

[14 of 23 positions shown; findings below may reference images not displayed]

FINDINGS: Right Kidney:

Renal measurements: 12.4 x 4.9 x 6.2 cm = volume: 194 mL .
Echogenicity within normal limits. No mass or hydronephrosis
visualized.

Left Kidney:

Renal measurements: 11.5 x 5.1 x 5.5 cm = volume: 169 mL.
Echogenicity within normal limits. No mass or hydronephrosis
visualized.

Bladder:

Foley catheter in place. The bladder is distended with urine,
suggesting the catheter is not functioning. Floor nurse notified of
this finding at the time of the scan.
IMPRESSION: Normal appearance of the kidneys. Normal size and echogenicity. No
hydronephrosis.

Foley catheter in the bladder, but the bladder is full of urine,
suggesting the catheter is not functioning. Floor nurse notified at
the time of the scan.

## 2019-04-15 MED ORDER — INSULIN ASPART 100 UNIT/ML ~~LOC~~ SOLN
10.0000 [IU] | Freq: Once | SUBCUTANEOUS | Status: AC
  Administered 2019-04-15: 10 [IU] via SUBCUTANEOUS

## 2019-04-15 NOTE — Progress Notes (Addendum)
[facility] NEPHROLOGY PROGRESS NOTE  Assessment/ Plan: Pt is a 43 y.o. yo male history of DM, diabetic gastroparesis, DVT, CKD with baseline creatinine around 1.5-2 transferred from [facility] for AKI and COVID positive.  In ER patient was hypotensive to 55D systolic with a peak creatinine level of 7.75, CO2 11 and K4.3.  #Acute kidney injury likely hemodynamically mediated in the setting of severe dehydration, hypotension due to N/V and decreased oral intake, +covid.  Patient has history of diabetic gastroparesis. -The urinalysis with no RBC or WBC, protein 30.  I will quantify proteinuria.  US renal showed normal echogenicity, no hydronephrosis. -Patient has increased urine output, 3.1 L in 24 hours and serum creatinine level trended down to 4.45 today.  Continue sodium bicarbonate IVF, encourage oral intake.   -Avoid nephrotoxins.  Strict ins and out for monitoring urine output. -No indication for dialysis.  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  Recent baseline creatinine level around 1.5-2.  #Anemia:  Monitor CBC.  Iron saturation low however No iron because of bacteremia.  Monitor CBC.  #COVID 19 +: Per primary team.  #Staph bacteremia and possible community-acquired pneumonia: Currently on ceftriaxone and azithromycin per primary team.  Objective Vital signs in last 24 hours: Vitals:   04/15/19 0300 04/15/19 0500 04/15/19 0815 04/15/19 1317  BP: (!) 133/91 (!) 157/97 (!) 145/94 (!) 138/91  Pulse: 77 76    Resp: 16 18 16 18   Temp: 97.8 F (36.6 C) 98.3 F (36.8 C) 98.1 F (36.7 C) 97.9 F (36.6 C)  TempSrc: Oral Axillary Oral Oral  SpO2: 100% 100% 99%   Weight:      Height:       Weight change:   Intake/Output Summary (Last 24 hours) at 04/15/2019 1333 Last data filed at 04/15/2019 1049 Gross per 24 hour  Intake 360 ml  Output 5350 ml  Net -4990 ml       Labs: Basic  Metabolic Panel: Recent Labs  Lab 04/14/19 1511 04/14/19 2346 04/15/19 0747  NA 138 133* 138  K 4.1 4.5 3.7  CL 114* 108 107  CO2 9* 9* 16*  GLUCOSE 220* 553* 198*  BUN 62* 62* 55*  CREATININE 6.04* 5.55* 4.45*  CALCIUM 8.0* 8.0* 8.2*  PHOS  --   --  4.6   Liver Function Tests: Recent Labs  Lab 04/13/19 2138 04/14/19 1511 04/15/19 0747  AST 43* 28 23  ALT 51* 41 33  ALKPHOS 89 82 85  BILITOT 0.7 0.7 0.6  PROT 7.6 6.6 6.6  ALBUMIN 3.4* 2.8* 2.8*   Recent Labs  Lab 04/13/19 2138  LIPASE 289*   No results for input(s): AMMONIA in the last 168 hours. CBC: Recent Labs  Lab 04/13/19 2138 04/14/19 1511 04/15/19 0747  WBC 4.6 3.8* 3.8*  NEUTROABS  --  2.2 3.3  HGB 9.1* 8.4* 8.4*  HCT 27.8* 26.1* 24.6*  MCV 93.0 95.3 90.4  PLT 273 250 304   Cardiac Enzymes: Recent Labs  Lab 04/15/19 0747  CKTOTAL 175   CBG: Recent Labs  Lab 04/14/19 2228 04/15/19 0116 04/15/19 0359 04/15/19 0818 04/15/19 1316  GLUCAP 450* 426* 246* 184* 140*    Iron Studies:  Recent Labs    04/15/19 0747  IRON 31*  TIBC 220*  FERRITIN 613*   Studies/Results: Dg Abd 1 View  Result Date: 04/14/2019 CLINICAL DATA:  History of hydronephrosis. EXAM: ABDOMEN - 1 VIEW COMPARISON:  CT abdomen pelvis  03/17/2019 FINDINGS: Paucity of small bowel gas. Gas within the stomach. Left lung bases clear. Osseous structures unremarkable. Supine evaluation limited for the detection of free intraperitoneal air. Chronic changes proximal left femur/greater trochanter. IMPRESSION: Paucity of small bowel gas without evidence for overt small bowel obstruction. Electronically Signed   By: Lovey Newcomer M.D.   On: 04/14/2019 17:46   US Renal  Result Date: 04/15/2019 CLINICAL DATA:  Acute kidney injury. EXAM: RENAL / URINARY TRACT ULTRASOUND COMPLETE COMPARISON:  CT abdomen 03/17/2019 FINDINGS: Right Kidney: Renal measurements: 12.4 x 4.9 x 6.2 cm = volume: 194 mL . Echogenicity within normal limits. No mass or  hydronephrosis visualized. Left Kidney: Renal measurements: 11.5 x 5.1 x 5.5 cm = volume: 169 mL. Echogenicity within normal limits. No mass or hydronephrosis visualized. Bladder: Foley catheter in place. The bladder is distended with urine, suggesting the catheter is not functioning. Floor nurse notified of this finding at the time of the scan. IMPRESSION: Normal appearance of the kidneys. Normal size and echogenicity. No hydronephrosis. Foley catheter in the bladder, but the bladder is full of urine, suggesting the catheter is not functioning. Floor nurse notified at the time of the scan. Electronically Signed   By: Nelson Chimes M.D.   On: 04/15/2019 06:58   Portable Chest 1 View  Result Date: 04/15/2019 CLINICAL DATA:  Shortness of breath.  COVID-19 positive. EXAM: PORTABLE CHEST 1 VIEW COMPARISON:  Chest x-ray dated April 13, 2019. FINDINGS: The heart size and mediastinal contours are within normal limits. Normal pulmonary vascularity. Mild patchy opacity at the left greater than right lung bases, more prominent when compared to prior study. No pleural effusion or pneumothorax. Chronic masslike opacity in the peripheral right upper lobe is unchanged. IMPRESSION: 1. Mild bibasilar patchy opacities, likely reflecting COVID-19 pneumonia. Electronically Signed   By: Titus Dubin M.D.   On: 04/15/2019 09:38   Dg Chest Port 1 View  Result Date: 04/13/2019 CLINICAL DATA:  Chest discomfort.  History of tuberculosis. EXAM: PORTABLE CHEST 1 VIEW COMPARISON:  March 30, 2019 FINDINGS: The nodular density in the right upper lung is stable and chronic. There is mild volume loss in the right upper lobe. The heart and mediastinum is stable. There is some increased opacity in the region of the left perihilar region, not seen previously. No other changes. IMPRESSION: Suspected developing subtle infiltrate in the left perihilar region. A PA and lateral chest x-ray may better evaluate. Alternatively, recommend attention on  follow-up. No other interval change. Electronically Signed   By: Dorise Bullion III M.D   On: 04/13/2019 22:55    Medications: Infusions: . sodium chloride 150 mL/hr at 04/15/19 0830  . azithromycin 500 mg (04/14/19 1912)  . cefTRIAXone (ROCEPHIN)  IV    . cefTRIAXone (ROCEPHIN)  IV    .  sodium bicarbonate (isotonic) infusion in sterile water 125 mL/hr at 04/15/19 0643    Scheduled Medications: . albuterol  2 puff Inhalation Q6H  . aspirin EC  81 mg Oral Daily  . heparin  5,000 Units Subcutaneous Q8H  . insulin aspart  0-5 Units Subcutaneous QHS  . insulin aspart protamine- aspart  25 Units Subcutaneous Q breakfast  . insulin aspart protamine- aspart  35 Units Subcutaneous Q supper  . methylPREDNISolone (SOLU-MEDROL) injection  30 mg Intravenous Q12H  . multivitamin with minerals  1 tablet Oral Daily  . pantoprazole  40 mg Oral BID AC  . vitamin C  500 mg Oral Daily  . zinc sulfate  220 mg Oral Daily    have reviewed scheduled and prn medications.  Physical Exam: Due to the nature of this patient's positive COVID-19 with isolation and in keeping with efforts to prevent the spread of infection and to conserve personal protective equipment, a physical exam was not personally performed. Patient's symptoms and exam were discussed in detail with Dr. Karleen Hampshire. A chart review of other providers notes and the patient's lab work as well as review of other pertinent studies was performed. Exam details from prior documentation were reviewed specifically and confirmed with the bedside nurse.  Location of service: [facility].  Per Dr. Karleen Hampshire: Patient is comfortable.  Lungs clear, no lower extremity edema.  Harrietta Incorvaia Tanna Furry 04/15/2019,1:33 PM  LOS: 1 day  Pager: [phone]

## 2019-04-15 NOTE — Plan of Care (Signed)

## 2019-04-15 NOTE — Progress Notes (Signed)
PROGRESS NOTE    Gregory Moody  MPN:[id number] DOB: 1976/06/12 DOA: 04/14/2019 PCP: [place]    Brief Narrative:  43 year old gentleman with prior history of insulin-dependent diabetes mellitus, diabetic gastro paresis presents to ED with nausea vomiting and generalized weakness for 2 days.  On arrival patient was found to be hypotensive.  He was also tested positive for coronavirus at [facility] and found to be in acute on stage III CKD.  He was admitted to [facility] and nephrology consulted. Assessment & Plan:   Principal Problem:   Acute renal failure (ARF) (HCC) Active Problems:   Diabetes mellitus with hyperglycemia (HCC)   GERD (gastroesophageal reflux disease)   Diabetic gastroparesis (HCC)   Nausea and vomiting   CAP (community acquired pneumonia) due to MSSA (methicillin sensitive Staphylococcus aureus) ([place])   COVID-19 virus infection     Acute on stage II, 3 CKD secondary to prerenal/severe dehydration from COVID-19 infection.  Improving renal parameters with IV hydration and IV bicarbonate.  Nephrology on board and appreciate their recommendations.  Avoid nephrotoxins and NSAIDs at this time. Urine output adequate and monitor the next 24 hours.  Repeat renal parameters in the morning.   Staph species bacteremia coag negative Repeat cultures will be ordered and continue with Rocephin for now.   Possible community-acquired pneumonia Continue with Rocephin and Zithromax.   Active COVID-19 viral infection Continue with Solu-Medrol.  Patient is currently not hypoxic.  His oxygen saturations greater than 90% on room air.    Type 2 diabetes mellitus with hyperglycemia Gap is closed, bicarb improved from 9-16. Continue with NovoLog 70/30 and sliding scale insulin.  Continue with IV hydration and repeat bicarb later this afternoon.   Diabetic gastroparesis with nausea and vomiting Antiemetics and gentle hydration.   Watery  diarrhea possibly secondary to active COVID-19 infection.   GERD continue with PPI   DVT prophylaxis: Heparin Code Status: Full code Family Communication: None at bedside disposition Plan: Pending clinical improvement   Consultants:   Nephrology  Procedures: None Antimicrobials: Rocephin and Zithromax  Subjective: Reports pain in the lower extremities.  3+ watery diarrhea associated with some nausea.  No vomiting.  No chest pain or shortness of breath  Objective: Vitals:   04/15/19 0300 04/15/19 0500 04/15/19 0815 04/15/19 1317  BP: (!) 133/91 (!) 157/97 (!) 145/94 (!) 138/91  Pulse: 77 76    Resp: 16 18 16 18   Temp: 97.8 F (36.6 C) 98.3 F (36.8 C) 98.1 F (36.7 C) 97.9 F (36.6 C)  TempSrc: Oral Axillary Oral Oral  SpO2: 100% 100% 99%   Weight:      Height:        Intake/Output Summary (Last 24 hours) at 04/15/2019 1427 Last data filed at 04/15/2019 1344 Gross per 24 hour  Intake 360 ml  Output 5950 ml  Net -5590 ml   Filed Weights   04/14/19 1103  Weight: 57.3 kg    Examination:  General exam: Appears calm and comfortable  Respiratory system: Clear to auscultation. Respiratory effort normal. Cardiovascular system: S1 & S2 heard, RRR. No JVD,  Gastrointestinal system: Abdomen is soft, mildly tender, nondistended, bowel sounds are good Central nervous system: Alert and oriented.  Grossly nonfocal Extremities: Symmetric 5 x 5 power. Skin: No rashes, lesions or ulcers Psychiatry:  Mood & affect appropriate.     Data Reviewed: I have personally reviewed following labs and imaging studies  CBC: Recent Labs  Lab 04/13/19 2138 04/14/19 1511 04/15/19 0747  WBC 4.6 3.8* 3.8*  NEUTROABS  --  2.2 3.3  HGB 9.1* 8.4* 8.4*  HCT 27.8* 26.1* 24.6*  MCV 93.0 95.3 90.4  PLT 273 250 458   Basic Metabolic Panel: Recent Labs  Lab 04/13/19 2138 04/14/19 0316 04/14/19 1511 04/14/19 2346 04/15/19 0747  NA 134* 138 138 133* 138  K 4.3 4.0 4.1 4.5 3.7  CL  105 114* 114* 108 107  CO2 11* 13* 9* 9* 16*  GLUCOSE 347* 198* 220* 553* 198*  BUN 74* 68* 62* 62* 55*  CREATININE 7.75* 6.46* 6.04* 5.55* 4.45*  CALCIUM 8.0* 7.1* 8.0* 8.0* 8.2*  MG  --   --   --   --  1.9  PHOS  --   --   --   --  4.6   GFR: Estimated Creatinine Clearance: 17.3 mL/min (A) (by C-G formula based on SCr of 4.45 mg/dL (H)). Liver Function Tests: Recent Labs  Lab 04/13/19 2138 04/14/19 1511 04/15/19 0747  AST 43* 28 23  ALT 51* 41 33  ALKPHOS 89 82 85  BILITOT 0.7 0.7 0.6  PROT 7.6 6.6 6.6  ALBUMIN 3.4* 2.8* 2.8*   Recent Labs  Lab 04/13/19 2138  LIPASE 289*   No results for input(s): AMMONIA in the last 168 hours. Coagulation Profile: No results for input(s): INR, PROTIME in the last 168 hours. Cardiac Enzymes: Recent Labs  Lab 04/15/19 0747  CKTOTAL 175   BNP (last 3 results) No results for input(s): PROBNP in the last 8760 hours. HbA1C: No results for input(s): HGBA1C in the last 72 hours. CBG: Recent Labs  Lab 04/14/19 2228 04/15/19 0116 04/15/19 0359 04/15/19 0818 04/15/19 1316  GLUCAP 450* 426* 246* 184* 140*   Lipid Profile: Recent Labs    04/14/19 1511 04/15/19 0747  TRIG 191* 78   Thyroid Function Tests: No results for input(s): TSH, T4TOTAL, FREET4, T3FREE, THYROIDAB in the last 72 hours. Anemia Panel: Recent Labs    04/14/19 1511 04/15/19 0747  FERRITIN 551* 613*  TIBC  --  220*  IRON  --  31*   Sepsis Labs: Recent Labs  Lab 04/13/19 2315 04/14/19 1511  PROCALCITON  --  2.13  LATICACIDVEN 0.8  --     Recent Results (from the past 240 hour(s))  SARS Coronavirus 2 (CEPHEID- Performed in [place] hospital lab), Hosp Order     Status: Abnormal   Collection Time: 04/13/19 10:02 PM   Specimen: Nasopharyngeal Swab  Result Value Ref Range Status   SARS Coronavirus 2 POSITIVE (A) NEGATIVE Final    Comment: RESULT CALLED TO, READ BACK BY AND VERIFIED WITH: RACHEL HAYDEN ON 04/13/2019 AT 2256 QSD (NOTE) If result  is NEGATIVE SARS-CoV-2 target nucleic acids are NOT DETECTED. The SARS-CoV-2 RNA is generally detectable in upper and lower  respiratory specimens during the acute phase of infection. The lowest  concentration of SARS-CoV-2 viral copies this assay can detect is 250  copies / mL. A negative result does not preclude SARS-CoV-2 infection  and should not be used as the sole basis for treatment or other  patient management decisions.  A negative result may occur with  improper specimen collection / handling, submission of specimen other  than nasopharyngeal swab, presence of viral mutation(s) within the  areas targeted by this assay, and inadequate number of viral copies  (<250 copies / mL). A negative result must be combined with clinical  observations, patient history, and epidemiological information. If result is POSITIVE SARS-CoV-2 target nucleic  acids are DETECTED.  The SARS-CoV-2 RNA is generally detectable in upper and lower  respiratory specimens during the acute phase of infection.  Positive  results are indicative of active infection with SARS-CoV-2.  Clinical  correlation with patient history and other diagnostic information is  necessary to determine patient infection status.  Positive results do  not rule out bacterial infection or co-infection with other viruses. If result is PRESUMPTIVE POSTIVE SARS-CoV-2 nucleic acids MAY BE PRESENT.   A presumptive positive result was obtained on the submitted specimen  and confirmed on repeat testing.  While 2019 novel coronavirus  (SARS-CoV-2) nucleic acids may be present in the submitted sample  additional confirmatory testing may be necessary for epidemiological  and / or clinical management purposes  to differentiate between  SARS-CoV-2 and other Sarbecovirus currently known to infect humans.  If clinically indicated additional testing with an alternate test  methodology [phone])  is advised. The SARS-CoV-2 RNA is generally    detectable in upper and lower respiratory specimens during the acute  phase of infection. The expected result is Negative. Fact Sheet for Patients:  StrictlyIdeas.no Fact Sheet for Healthcare Providers: BankingDealers.co.za This test is not yet approved or cleared by the [place] FDA and has been authorized for detection and/or diagnosis of SARS-CoV-2 by FDA under an Emergency Use Authorization (EUA).  This EUA will remain in effect (meaning this test can be used) for the duration of the COVID-19 declaration under Section 564(b)(1) of the Act, 21 U.S.C. section 360bbb-3(b)(1), unless the authorization is terminated or revoked sooner. Performed at [facility], [address] [place], [place] [address]   Culture, blood (routine x 2)     Status: Abnormal (Preliminary result)   Collection Time: 04/13/19 11:15 PM   Specimen: BLOOD  Result Value Ref Range Status   Specimen Description   Final    BLOOD LEFT ANTECUBITAL Performed at [facility], [address] [place], [place] [address]    Special Requests   Final    BOTTLES DRAWN AEROBIC AND ANAEROBIC Blood Culture adequate volume Performed at [facility], [address] [place], [place] [address]    Culture  Setup Time   Final    GRAM POSITIVE COCCI AEROBIC BOTTLE ONLY CRITICAL RESULT CALLED TO, READ BACK BY AND VERIFIED WITH: BRANDY DAVIS AT 1707 ON 04/14/2019 [place]. ALSO CALLED TO KAREN PATTON PHARMD AT [facility] AT 6294 ON 04/14/2019 [place]. Chloride Performed at [facility], [place] [address], [place], [place] [address]    Culture STAPHYLOCOCCUS HOMINIS (A)  Final   Report Status PENDING  Incomplete  Culture, blood (routine x 2)     Status: Abnormal (Preliminary result)   Collection Time: 04/13/19 11:15 PM   Specimen: BLOOD  Result Value Ref Range Status   Specimen Description   Final    BLOOD LEFT  FOREARM Performed at [facility], [address] [place], [place] [address]    Special Requests   Final    BOTTLES DRAWN AEROBIC AND ANAEROBIC Blood Culture adequate volume Performed at [facility], [address] [place], [place] [address]    Culture  Setup Time   Final    GRAM POSITIVE COCCI AEROBIC BOTTLE ONLY CRITICAL RESULT CALLED TO, READ BACK BY AND VERIFIED WITH: BRANDY DAVIS AT 1707 ON 04/14/2019 [place]. ALSO CALLED TO KAREN PATTON PHARMD AT [facility] AT 2751 ON 04/14/2019 [place]. Derma Performed at  [facility], [place] [address] [place], [place] [address]    Culture STAPHYLOCOCCUS HOMINIS (A)  Final   Report Status PENDING  Incomplete  Blood Culture ID Panel (Reflexed)     Status: Abnormal   Collection Time: 04/13/19 11:15 PM  Result Value Ref Range Status   Enterococcus species NOT DETECTED NOT DETECTED Final   Listeria monocytogenes NOT DETECTED NOT DETECTED Final   Staphylococcus species DETECTED (A) NOT DETECTED Final    Comment: Methicillin (oxacillin) susceptible coagulase negative staphylococcus. Possible blood culture contaminant (unless isolated from more than one blood culture draw or clinical case suggests pathogenicity). No antibiotic treatment is indicated for blood  culture contaminants. CRITICAL RESULT CALLED TO, READ BACK BY AND VERIFIED WITH: KAREN PATTON ON 04/14/19 AT 1711 [facility]    Staphylococcus aureus (BCID) NOT DETECTED NOT DETECTED Final   Methicillin resistance NOT DETECTED NOT DETECTED Final   Streptococcus species NOT DETECTED NOT DETECTED Final   Streptococcus agalactiae NOT DETECTED NOT DETECTED Final   Streptococcus pneumoniae NOT DETECTED NOT DETECTED Final   Streptococcus pyogenes NOT DETECTED NOT DETECTED Final   Acinetobacter baumannii NOT DETECTED NOT DETECTED Final   Enterobacteriaceae species NOT DETECTED NOT DETECTED Final   Enterobacter cloacae complex NOT DETECTED NOT DETECTED  Final   Escherichia coli NOT DETECTED NOT DETECTED Final   Klebsiella oxytoca NOT DETECTED NOT DETECTED Final   Klebsiella pneumoniae NOT DETECTED NOT DETECTED Final   Proteus species NOT DETECTED NOT DETECTED Final   Serratia marcescens NOT DETECTED NOT DETECTED Final   Haemophilus influenzae NOT DETECTED NOT DETECTED Final   Neisseria meningitidis NOT DETECTED NOT DETECTED Final   Pseudomonas aeruginosa NOT DETECTED NOT DETECTED Final   Candida albicans NOT DETECTED NOT DETECTED Final   Candida glabrata NOT DETECTED NOT DETECTED Final   Candida krusei NOT DETECTED NOT DETECTED Final   Candida parapsilosis NOT DETECTED NOT DETECTED Final   Candida tropicalis NOT DETECTED NOT DETECTED Final    Comment: Performed at [facility], [address] [place], [place] [address]  MRSA PCR Screening     Status: None   Collection Time: 04/14/19  7:07 PM   Specimen: Nasopharyngeal  Result Value Ref Range Status   MRSA by PCR NEGATIVE NEGATIVE Final    Comment:        The GeneXpert MRSA Assay (FDA approved for NASAL specimens only), is one component of a comprehensive MRSA colonization surveillance program. It is not intended to diagnose MRSA infection nor to guide or monitor treatment for MRSA infections. Performed at [facility], [place] [address] [place], [place] [address]          Radiology Studies: Dg Abd 1 View  Result Date: 04/14/2019 CLINICAL DATA:  History of hydronephrosis. EXAM: ABDOMEN - 1 VIEW COMPARISON:  CT abdomen pelvis 03/17/2019 FINDINGS: Paucity of small bowel gas. Gas within the stomach. Left lung bases clear. Osseous structures unremarkable. Supine evaluation limited for the detection of free intraperitoneal air. Chronic changes proximal left femur/greater trochanter. IMPRESSION: Paucity of small bowel gas without evidence for overt small bowel obstruction. Electronically Signed   By: Lovey Newcomer M.D.   On: 04/14/2019 17:46   US Renal  Result Date:  04/15/2019 CLINICAL DATA:  Acute kidney injury. EXAM: RENAL / URINARY TRACT ULTRASOUND COMPLETE COMPARISON:  CT abdomen 03/17/2019 FINDINGS: Right Kidney: Renal measurements: 12.4 x 4.9 x 6.2 cm = volume: 194 mL . Echogenicity within normal limits. No mass or hydronephrosis visualized. Left Kidney: Renal  measurements: 11.5 x 5.1 x 5.5 cm = volume: 169 mL. Echogenicity within normal limits. No mass or hydronephrosis visualized. Bladder: Foley catheter in place. The bladder is distended with urine, suggesting the catheter is not functioning. Floor nurse notified of this finding at the time of the scan. IMPRESSION: Normal appearance of the kidneys. Normal size and echogenicity. No hydronephrosis. Foley catheter in the bladder, but the bladder is full of urine, suggesting the catheter is not functioning. Floor nurse notified at the time of the scan. Electronically Signed   By: Nelson Chimes M.D.   On: 04/15/2019 06:58   Portable Chest 1 View  Result Date: 04/15/2019 CLINICAL DATA:  Shortness of breath.  COVID-19 positive. EXAM: PORTABLE CHEST 1 VIEW COMPARISON:  Chest x-ray dated April 13, 2019. FINDINGS: The heart size and mediastinal contours are within normal limits. Normal pulmonary vascularity. Mild patchy opacity at the left greater than right lung bases, more prominent when compared to prior study. No pleural effusion or pneumothorax. Chronic masslike opacity in the peripheral right upper lobe is unchanged. IMPRESSION: 1. Mild bibasilar patchy opacities, likely reflecting COVID-19 pneumonia. Electronically Signed   By: Titus Dubin M.D.   On: 04/15/2019 09:38   Dg Chest Port 1 View  Result Date: 04/13/2019 CLINICAL DATA:  Chest discomfort.  History of tuberculosis. EXAM: PORTABLE CHEST 1 VIEW COMPARISON:  March 30, 2019 FINDINGS: The nodular density in the right upper lung is stable and chronic. There is mild volume loss in the right upper lobe. The heart and mediastinum is stable. There is some increased  opacity in the region of the left perihilar region, not seen previously. No other changes. IMPRESSION: Suspected developing subtle infiltrate in the left perihilar region. A PA and lateral chest x-ray may better evaluate. Alternatively, recommend attention on follow-up. No other interval change. Electronically Signed   By: Dorise Bullion III M.D   On: 04/13/2019 22:55        Scheduled Meds:  albuterol  2 puff Inhalation Q6H   aspirin EC  81 mg Oral Daily   heparin  5,000 Units Subcutaneous Q8H   insulin aspart  0-5 Units Subcutaneous QHS   insulin aspart protamine- aspart  25 Units Subcutaneous Q breakfast   insulin aspart protamine- aspart  35 Units Subcutaneous Q supper   methylPREDNISolone (SOLU-MEDROL) injection  30 mg Intravenous Q12H   multivitamin with minerals  1 tablet Oral Daily   pantoprazole  40 mg Oral BID AC   vitamin C  500 mg Oral Daily   zinc sulfate  220 mg Oral Daily   Continuous Infusions:  azithromycin 500 mg (04/14/19 1912)   cefTRIAXone (ROCEPHIN)  IV     cefTRIAXone (ROCEPHIN)  IV 2 g (04/15/19 1334)    sodium bicarbonate (isotonic) infusion in sterile water 125 mL/hr at 04/15/19 0643     LOS: 1 day    Time spent: 36 minutes    Hosie Poisson, MD [facility] Pager [phone] If 7PM-7AM, please contact night-coverage [email or web address] Password [email or web address] 04/15/2019, 2:27 PM

## 2019-04-16 ENCOUNTER — Encounter (HOSPITAL_COMMUNITY): Payer: Self-pay

## 2019-04-16 ENCOUNTER — Inpatient Hospital Stay (HOSPITAL_COMMUNITY): Payer: HRSA Program

## 2019-04-16 LAB — COMPREHENSIVE METABOLIC PANEL
ALT: 28 U/L (ref 0–44)
AST: 28 U/L (ref 15–41)
Albumin: 2.7 g/dL — ABNORMAL LOW (ref 3.5–5.0)
Alkaline Phosphatase: 82 U/L (ref 38–126)
Anion gap: 15 (ref 5–15)
BUN: 47 mg/dL — ABNORMAL HIGH (ref 6–20)
CO2: 25 mmol/L (ref 22–32)
Calcium: 8 mg/dL — ABNORMAL LOW (ref 8.9–10.3)
Chloride: 105 mmol/L (ref 98–111)
Creatinine, Ser: 2.86 mg/dL — ABNORMAL HIGH (ref 0.61–1.24)
GFR calc Af Amer: 30 mL/min — ABNORMAL LOW (ref >60)
GFR calc non Af Amer: 26 mL/min — ABNORMAL LOW (ref >60)
Glucose, Bld: 155 mg/dL — ABNORMAL HIGH (ref 70–99)
Potassium: 3.3 mmol/L — ABNORMAL LOW (ref 3.5–5.1)
Sodium: 145 mmol/L (ref 135–145)
Total Bilirubin: 0.6 mg/dL (ref 0.3–1.2)
Total Protein: 6.6 g/dL (ref 6.5–8.1)

## 2019-04-16 LAB — PHOSPHORUS: Phosphorus: 4 mg/dL (ref 2.5–4.6)

## 2019-04-16 LAB — FERRITIN: Ferritin: 529 ng/mL — ABNORMAL HIGH (ref 24–336)

## 2019-04-16 LAB — CULTURE, BLOOD (ROUTINE X 2)
Special Requests: ADEQUATE
Special Requests: ADEQUATE

## 2019-04-16 LAB — GLUCOSE, CAPILLARY
Glucose-Capillary: 120 mg/dL — ABNORMAL HIGH (ref 70–99)
Glucose-Capillary: 146 mg/dL — ABNORMAL HIGH (ref 70–99)
Glucose-Capillary: 133 mg/dL — ABNORMAL HIGH (ref 70–99)
Glucose-Capillary: 172 mg/dL — ABNORMAL HIGH (ref 70–99)
Glucose-Capillary: 114 mg/dL — ABNORMAL HIGH (ref 70–99)

## 2019-04-16 LAB — CBC WITH DIFFERENTIAL/PLATELET
Abs Immature Granulocytes: 0.03 10*3/uL (ref 0.00–0.07)
Basophils Absolute: 0 10*3/uL (ref 0.0–0.1)
Basophils Relative: 0 %
Eosinophils Absolute: 0 10*3/uL (ref 0.0–0.5)
Eosinophils Relative: 0 %
HCT: 26.2 % — ABNORMAL LOW (ref 39.0–52.0)
Hemoglobin: 8.9 g/dL — ABNORMAL LOW (ref 13.0–17.0)
Immature Granulocytes: 0 %
Lymphocytes Relative: 5 %
Lymphs Abs: 0.4 10*3/uL — ABNORMAL LOW (ref 0.7–4.0)
MCH: 30.5 pg (ref 26.0–34.0)
MCHC: 34 g/dL (ref 30.0–36.0)
MCV: 89.7 fL (ref 80.0–100.0)
Monocytes Absolute: 0.2 10*3/uL (ref 0.1–1.0)
Monocytes Relative: 3 %
Neutro Abs: 7.2 10*3/uL (ref 1.7–7.7)
Neutrophils Relative %: 92 %
Platelets: 323 10*3/uL (ref 150–400)
RBC: 2.92 MIL/uL — ABNORMAL LOW (ref 4.22–5.81)
RDW: 13.9 % (ref 11.5–15.5)
WBC: 7.9 10*3/uL (ref 4.0–10.5)
nRBC: 0 % (ref 0.0–0.2)

## 2019-04-16 LAB — LIPASE, BLOOD: Lipase: 106 U/L — ABNORMAL HIGH (ref 11–51)

## 2019-04-16 LAB — INTERLEUKIN-6, PLASMA: Interleukin-6, Plasma: 10.3 pg/mL (ref 0.0–12.2)

## 2019-04-16 LAB — D-DIMER, QUANTITATIVE: D-Dimer, Quant: 1.64 ug/mL-FEU — ABNORMAL HIGH (ref 0.00–0.50)

## 2019-04-16 LAB — TRIGLYCERIDES: Triglycerides: 121 mg/dL (ref <150)

## 2019-04-16 LAB — C-REACTIVE PROTEIN: CRP: 4.8 mg/dL — ABNORMAL HIGH (ref <1.0)

## 2019-04-16 LAB — MAGNESIUM: Magnesium: 1.6 mg/dL — ABNORMAL LOW (ref 1.7–2.4)

## 2019-04-16 LAB — CK: Total CK: 162 U/L (ref 49–397)

## 2019-04-16 IMAGING — DX PORTABLE CHEST - 1 VIEW
1 series · 1 of 1 positions shown · non-contrast
Comparison: Earlier radiograph dated [DATE] and chest
radiograph dated [DATE]

CLINICAL DATA: 42-year-old male with pulmonary opacities presenting
for follow-up.

EXAM:
PORTABLE CHEST 1 VIEW

[chest ap]
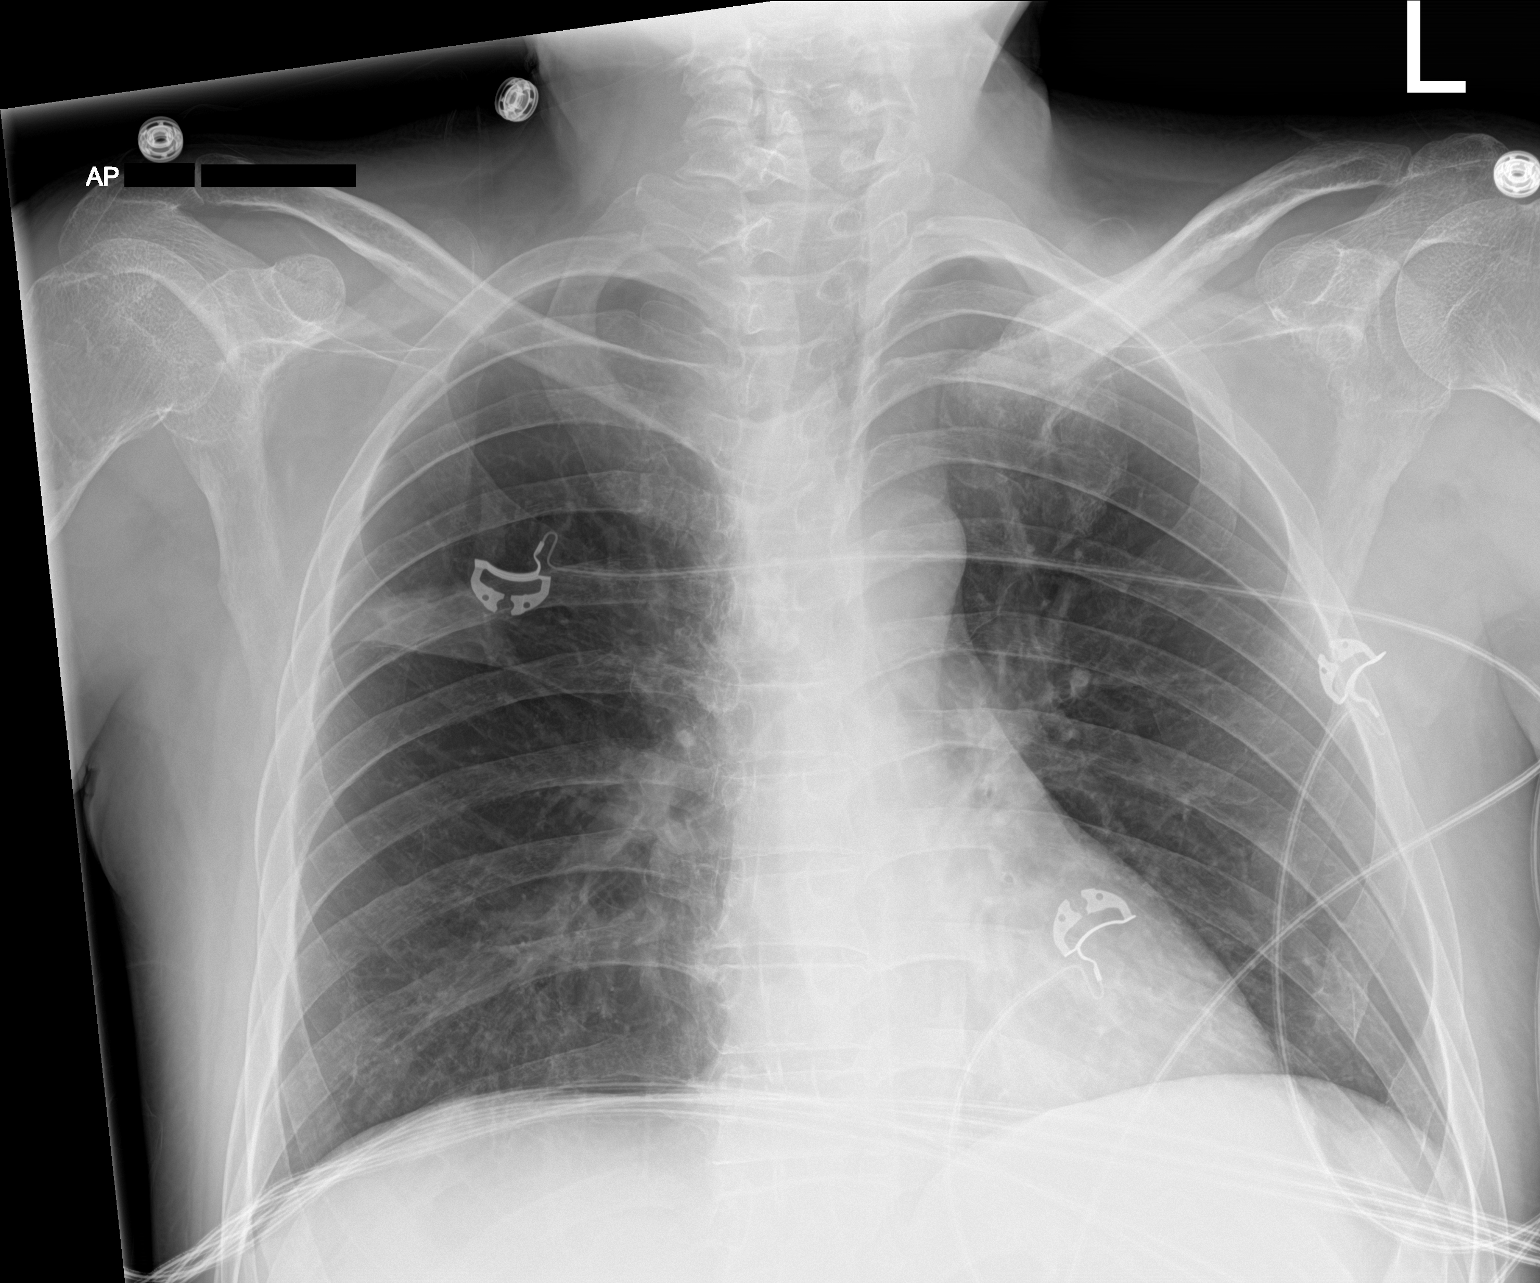

[1 of 1 positions shown; findings below may reference images not displayed]

FINDINGS: Left lung base streaky densities appear slightly improved since the
earlier radiograph of [DATE]. Stable area of opacity in the
right upper lobe along the minor fissure again noted. No pleural
effusion or pneumothorax. The cardiac silhouette is within normal
limits. No acute osseous pathology.
IMPRESSION: Overall slight interval improvement of bibasilar densities since the
earlier radiograph.

## 2019-04-16 MED ORDER — MAGNESIUM SULFATE 50 % IJ SOLN
3.0000 g | Freq: Once | INTRAVENOUS | Status: AC
  Administered 2019-04-16: 3 g via INTRAVENOUS
  Filled 2019-04-16: qty 6

## 2019-04-16 MED ORDER — SODIUM CHLORIDE 0.9 % IV SOLN
INTRAVENOUS | Status: DC
  Administered 2019-04-16 – 2019-04-17 (×4): via INTRAVENOUS

## 2019-04-16 MED ORDER — SODIUM CHLORIDE 0.9 % IV SOLN
2.0000 g | INTRAVENOUS | Status: DC
  Administered 2019-04-16 – 2019-04-18 (×3): 2 g via INTRAVENOUS
  Filled 2019-04-16: qty 20
  Filled 2019-04-16: qty 2
  Filled 2019-04-16: qty 20
  Filled 2019-04-16: qty 2
  Filled 2019-04-16: qty 20

## 2019-04-16 MED ORDER — CALCIUM CARBONATE ANTACID 500 MG PO CHEW
1.0000 | CHEWABLE_TABLET | Freq: Three times a day (TID) | ORAL | Status: DC | PRN
  Administered 2019-04-16 – 2019-04-20 (×3): 200 mg via ORAL
  Filled 2019-04-16 (×4): qty 1

## 2019-04-16 MED ORDER — POTASSIUM CHLORIDE CRYS ER 20 MEQ PO TBCR
40.0000 meq | EXTENDED_RELEASE_TABLET | Freq: Once | ORAL | Status: AC
  Administered 2019-04-16: 40 meq via ORAL
  Filled 2019-04-16: qty 2

## 2019-04-16 NOTE — Progress Notes (Signed)
Chart reviewed; [place] Supervisor [phone]

## 2019-04-16 NOTE — Progress Notes (Signed)
PROGRESS NOTE    Gregory Moody  EUM:[id number] DOB: 1976-08-31 DOA: 04/14/2019 PCP: [place]    Brief Narrative:  43 year old gentleman with prior history of insulin-dependent diabetes mellitus, diabetic gastro paresis presents to ED with nausea vomiting and generalized weakness for 2 days.  On arrival patient was found to be hypotensive.  He was also tested positive for coronavirus at [facility] and found to be in acute on stage III CKD.  He was admitted to [facility] and nephrology consulted. Assessment & Plan:   Principal Problem:   Acute renal failure (ARF) (HCC) Active Problems:   Diabetes mellitus with hyperglycemia (HCC)   GERD (gastroesophageal reflux disease)   Diabetic gastroparesis (HCC)   Nausea and vomiting   CAP (community acquired pneumonia) due to MSSA (methicillin sensitive Staphylococcus aureus) ([place])   COVID-19 virus infection     Acute on stage II, 3 CKD secondary to prerenal/severe dehydration, poor oral intake, and  from COVID-19 infection.  Improving renal parameters with IV hydration and IV bicarbonate.  Nephrology on board and appreciate their recommendations.  Avoid nephrotoxins and NSAIDs at this time. Creatinine improved from 4 to 2.86.  Urine output adequate and about 3.5 lit in the last 24 hours.   Staph hominis bacteremia coag negative Sensitive to vancomycin, rocephin changed to vancomycin.     Hypokalemia and hypomagnesemia:  Replaced.    Active COVID-19 viral infection Continue with Solu-Medrol.  Patient is currently not hypoxic.  His oxygen saturations greater than 90% on room air. Will repeat CXR and stop the rocephin and zithromax.  He reports nausea, vomiting and diarrhea resolved. He denies any chest pain or sob.     Type 2 diabetes mellitus with hyperglycemia Gap is closed, bicarb improved to 25.  Continue with NovoLog 70/30 and sliding scale insulin.   CBG (last 3)  Recent Labs   04/16/19 0437 04/16/19 0811 04/16/19 1124  GLUCAP 120* 146* 133*   Resume SSI.    Diabetic gastroparesis with nausea and vomiting Antiemetics and gentle hydration.   Watery diarrhea possibly secondary to active COVID-19 infection. Resolved.  D.c eneteric precautions.    Mild acute pancreatitis:  Improved. Abdominal pain improved and he is able to tolerate carb modified diet.   GERD continue with PPI   DVT prophylaxis: Heparin Code Status: Full code Family Communication: None at bedside  disposition Plan: Pending clinical improvement   Consultants:   Nephrology  Procedures: None Antimicrobials: Rocephin and Zithromax to vancomycin.   Subjective: No nausea, or vomiting or abdominal pain.  Objective: Vitals:   04/16/19 0100 04/16/19 0300 04/16/19 0812 04/16/19 1129  BP: 110/82 109/81 118/84 96/75  Pulse: 73 72 70 72  Resp: 16 16 18 16   Temp: 98.2 F (36.8 C) 97.7 F (36.5 C) 97.9 F (36.6 C) 98.3 F (36.8 C)  TempSrc: Oral Oral Oral Oral  SpO2: 100% 98% 98% 98%  Weight:      Height:        Intake/Output Summary (Last 24 hours) at 04/16/2019 1150 Last data filed at 04/16/2019 1132 Gross per 24 hour  Intake 965 ml  Output 2500 ml  Net -1535 ml   Filed Weights   04/14/19 1103  Weight: 57.3 kg    Examination:  General exam: Appears calm and comfortable  Respiratory system: Clear to auscultation. Respiratory effort normal. No wheezing or rhonchi.  Cardiovascular system: S1 & S2 heard, RRR. No JVD,  Gastrointestinal system: Abdomen is soft, mildly tender, nondistended, bowel sounds are  good Central nervous system: Alert and oriented.  Grossly nonfocal Extremities: Symmetric 5 x 5 power. Skin: No rashes, lesions or ulcers Psychiatry:  Mood & affect appropriate.     Data Reviewed: I have personally reviewed following labs and imaging studies  CBC: Recent Labs  Lab 04/13/19 2138 04/14/19 1511 04/15/19 0747 04/16/19 0751  WBC 4.6 3.8* 3.8* 7.9   NEUTROABS  --  2.2 3.3 7.2  HGB 9.1* 8.4*  8.2* 8.4* 8.9*  HCT 27.8* 26.1* 24.6* 26.2*  MCV 93.0 95.3 90.4 89.7  PLT 273 250 304 875   Basic Metabolic Panel: Recent Labs  Lab 04/14/19 0316 04/14/19 1511 04/14/19 2346 04/15/19 0747 04/16/19 0751  NA 138 138 133* 138 145  K 4.0 4.1 4.5 3.7 3.3*  CL 114* 114* 108 107 105  CO2 13* 9* 9* 16* 25  GLUCOSE 198* 220* 553* 198* 155*  BUN 68* 62* 62* 55* 47*  CREATININE 6.46* 6.04* 5.55* 4.45* 2.86*  CALCIUM 7.1* 8.0* 8.0* 8.2* 8.0*  MG  --   --   --  1.9 1.6*  PHOS  --   --   --  4.6 4.0   GFR: Estimated Creatinine Clearance: 27 mL/min (A) (by C-G formula based on SCr of 2.86 mg/dL (H)). Liver Function Tests: Recent Labs  Lab 04/13/19 2138 04/14/19 1511 04/15/19 0747 04/16/19 0751  AST 43* 28 23 28   ALT 51* 41 33 28  ALKPHOS 89 82 85 82  BILITOT 0.7 0.7 0.6 0.6  PROT 7.6 6.6 6.6 6.6  ALBUMIN 3.4* 2.8* 2.8* 2.7*   Recent Labs  Lab 04/13/19 2138 04/16/19 0751  LIPASE 289* 106*   No results for input(s): AMMONIA in the last 168 hours. Coagulation Profile: No results for input(s): INR, PROTIME in the last 168 hours. Cardiac Enzymes: Recent Labs  Lab 04/15/19 0747 04/16/19 0751  CKTOTAL 175 162   BNP (last 3 results) No results for input(s): PROBNP in the last 8760 hours. HbA1C: No results for input(s): HGBA1C in the last 72 hours. CBG: Recent Labs  Lab 04/15/19 1755 04/15/19 2110 04/16/19 0437 04/16/19 0811 04/16/19 1124  GLUCAP 151* 114* 120* 146* 133*   Lipid Profile: Recent Labs    04/15/19 0747 04/16/19 0751  TRIG 78 121   Thyroid Function Tests: No results for input(s): TSH, T4TOTAL, FREET4, T3FREE, THYROIDAB in the last 72 hours. Anemia Panel: Recent Labs    04/15/19 0747 04/16/19 0751  FERRITIN 613* 529*  TIBC 220*  --   IRON 31*  --    Sepsis Labs: Recent Labs  Lab 04/13/19 2315 04/14/19 1511  PROCALCITON  --  2.13  LATICACIDVEN 0.8  --     Recent Results (from the past 240  hour(s))  SARS Coronavirus 2 (CEPHEID- Performed in [place] hospital lab), Hosp Order     Status: Abnormal   Collection Time: 04/13/19 10:02 PM   Specimen: Nasopharyngeal Swab  Result Value Ref Range Status   SARS Coronavirus 2 POSITIVE (A) NEGATIVE Final    Comment: RESULT CALLED TO, READ BACK BY AND VERIFIED WITH: RACHEL HAYDEN ON 04/13/2019 AT 2256 QSD (NOTE) If result is NEGATIVE SARS-CoV-2 target nucleic acids are NOT DETECTED. The SARS-CoV-2 RNA is generally detectable in upper and lower  respiratory specimens during the acute phase of infection. The lowest  concentration of SARS-CoV-2 viral copies this assay can detect is 250  copies / mL. A negative result does not preclude SARS-CoV-2 infection  and should not be used as the  sole basis for treatment or other  patient management decisions.  A negative result may occur with  improper specimen collection / handling, submission of specimen other  than nasopharyngeal swab, presence of viral mutation(s) within the  areas targeted by this assay, and inadequate number of viral copies  (<250 copies / mL). A negative result must be combined with clinical  observations, patient history, and epidemiological information. If result is POSITIVE SARS-CoV-2 target nucleic acids are DETECTED.  The SARS-CoV-2 RNA is generally detectable in upper and lower  respiratory specimens during the acute phase of infection.  Positive  results are indicative of active infection with SARS-CoV-2.  Clinical  correlation with patient history and other diagnostic information is  necessary to determine patient infection status.  Positive results do  not rule out bacterial infection or co-infection with other viruses. If result is PRESUMPTIVE POSTIVE SARS-CoV-2 nucleic acids MAY BE PRESENT.   A presumptive positive result was obtained on the submitted specimen  and confirmed on repeat testing.  While 2019 novel coronavirus  (SARS-CoV-2) nucleic acids may be  present in the submitted sample  additional confirmatory testing may be necessary for epidemiological  and / or clinical management purposes  to differentiate between  SARS-CoV-2 and other Sarbecovirus currently known to infect humans.  If clinically indicated additional testing with an alternate test  methodology [phone])  is advised. The SARS-CoV-2 RNA is generally  detectable in upper and lower respiratory specimens during the acute  phase of infection. The expected result is Negative. Fact Sheet for Patients:  StrictlyIdeas.no Fact Sheet for Healthcare Providers: BankingDealers.co.za This test is not yet approved or cleared by the [place] FDA and has been authorized for detection and/or diagnosis of SARS-CoV-2 by FDA under an Emergency Use Authorization (EUA).  This EUA will remain in effect (meaning this test can be used) for the duration of the COVID-19 declaration under Section 564(b)(1) of the Act, 21 U.S.C. section 360bbb-3(b)(1), unless the authorization is terminated or revoked sooner. Performed at [facility], [address] [place], [place] [address]   Culture, blood (routine x 2)     Status: Abnormal   Collection Time: 04/13/19 11:15 PM   Specimen: BLOOD  Result Value Ref Range Status   Specimen Description   Final    BLOOD LEFT ANTECUBITAL Performed at [facility], [address] [place], [place] [address]    Special Requests   Final    BOTTLES DRAWN AEROBIC AND ANAEROBIC Blood Culture adequate volume Performed at [facility], [address] [place], [place] [address]    Culture  Setup Time   Final    GRAM POSITIVE COCCI AEROBIC BOTTLE ONLY CRITICAL RESULT CALLED TO, READ BACK BY AND VERIFIED WITH: BRANDY DAVIS AT 1707 ON 04/14/2019 [place]. ALSO CALLED TO KAREN PATTON PHARMD AT [facility] AT 4627 ON 04/14/2019 [place]. [place] Performed at [facility], [place] [address] [place], [place] [address]    Culture STAPHYLOCOCCUS HOMINIS (A)  Final   Report Status 04/16/2019 FINAL  Final   Organism ID, Bacteria STAPHYLOCOCCUS HOMINIS  Final      Susceptibility   Staphylococcus hominis - MIC*    CIPROFLOXACIN <=0.5 SENSITIVE Sensitive     ERYTHROMYCIN >=8 RESISTANT Resistant     GENTAMICIN <=0.5 SENSITIVE Sensitive     OXACILLIN <=0.25 SENSITIVE Sensitive     TETRACYCLINE <=1 SENSITIVE Sensitive     VANCOMYCIN 1 SENSITIVE Sensitive  TRIMETH/SULFA <=10 SENSITIVE Sensitive     CLINDAMYCIN <=0.25 SENSITIVE Sensitive     RIFAMPIN <=0.5 SENSITIVE Sensitive     Inducible Clindamycin NEGATIVE Sensitive     * STAPHYLOCOCCUS HOMINIS  Culture, blood (routine x 2)     Status: Abnormal   Collection Time: 04/13/19 11:15 PM   Specimen: BLOOD  Result Value Ref Range Status   Specimen Description   Final    BLOOD LEFT FOREARM Performed at [facility], [address] [place], [place] [address]    Special Requests   Final    BOTTLES DRAWN AEROBIC AND ANAEROBIC Blood Culture adequate volume Performed at [facility], [address] [place], [place] [address]    Culture  Setup Time   Final    GRAM POSITIVE COCCI AEROBIC BOTTLE ONLY CRITICAL RESULT CALLED TO, READ BACK BY AND VERIFIED WITH: BRANDY DAVIS AT 1707 ON 04/14/2019 [place]. ALSO CALLED TO KAREN PATTON PHARMD AT [facility] AT 6063 ON 04/14/2019 [place]. GRAM STAIN REVIEWED-AGREE WITH RESULT T. TYSOR    Culture (A)  Final    STAPHYLOCOCCUS HOMINIS SUSCEPTIBILITIES PERFORMED ON PREVIOUS CULTURE WITHIN THE LAST 5 DAYS. Performed at [facility], [place] [address], [place], [place] [address]    Report Status 04/16/2019 FINAL  Final  Blood Culture ID Panel (Reflexed)     Status: Abnormal   Collection Time: 04/13/19 11:15 PM  Result Value Ref Range Status   Enterococcus species NOT DETECTED NOT DETECTED Final   Listeria monocytogenes NOT DETECTED NOT DETECTED Final    Staphylococcus species DETECTED (A) NOT DETECTED Final    Comment: Methicillin (oxacillin) susceptible coagulase negative staphylococcus. Possible blood culture contaminant (unless isolated from more than one blood culture draw or clinical case suggests pathogenicity). No antibiotic treatment is indicated for blood  culture contaminants. CRITICAL RESULT CALLED TO, READ BACK BY AND VERIFIED WITH: KAREN PATTON ON 04/14/19 AT 1711 [facility]    Staphylococcus aureus (BCID) NOT DETECTED NOT DETECTED Final   Methicillin resistance NOT DETECTED NOT DETECTED Final   Streptococcus species NOT DETECTED NOT DETECTED Final   Streptococcus agalactiae NOT DETECTED NOT DETECTED Final   Streptococcus pneumoniae NOT DETECTED NOT DETECTED Final   Streptococcus pyogenes NOT DETECTED NOT DETECTED Final   Acinetobacter baumannii NOT DETECTED NOT DETECTED Final   Enterobacteriaceae species NOT DETECTED NOT DETECTED Final   Enterobacter cloacae complex NOT DETECTED NOT DETECTED Final   Escherichia coli NOT DETECTED NOT DETECTED Final   Klebsiella oxytoca NOT DETECTED NOT DETECTED Final   Klebsiella pneumoniae NOT DETECTED NOT DETECTED Final   Proteus species NOT DETECTED NOT DETECTED Final   Serratia marcescens NOT DETECTED NOT DETECTED Final   Haemophilus influenzae NOT DETECTED NOT DETECTED Final   Neisseria meningitidis NOT DETECTED NOT DETECTED Final   Pseudomonas aeruginosa NOT DETECTED NOT DETECTED Final   Candida albicans NOT DETECTED NOT DETECTED Final   Candida glabrata NOT DETECTED NOT DETECTED Final   Candida krusei NOT DETECTED NOT DETECTED Final   Candida parapsilosis NOT DETECTED NOT DETECTED Final   Candida tropicalis NOT DETECTED NOT DETECTED Final    Comment: Performed at [facility], [address] [place],  [address]  Culture, blood (Routine X 2) w Reflex to ID Panel     Status: None (Preliminary result)   Collection Time: 04/14/19  3:15 PM   Specimen: BLOOD  Result Value  Ref Range Status   Specimen Description BLOOD BLOOD RIGHT FOREARM  Final   Special Requests  Final    BOTTLES DRAWN AEROBIC ONLY Blood Culture adequate volume   Culture   Final    NO GROWTH < 24 HOURS Performed at [facility], [address], [place], [place] [address]    Report Status PENDING  Incomplete  Culture, blood (Routine X 2) w Reflex to ID Panel     Status: None (Preliminary result)   Collection Time: 04/14/19  3:15 PM   Specimen: BLOOD RIGHT HAND  Result Value Ref Range Status   Specimen Description BLOOD RIGHT HAND  Final   Special Requests   Final    BOTTLES DRAWN AEROBIC ONLY Blood Culture adequate volume   Culture   Final    NO GROWTH < 24 HOURS Performed at [facility], [place] [address], [place], [place] [address]    Report Status PENDING  Incomplete  MRSA PCR Screening     Status: None   Collection Time: 04/14/19  7:07 PM   Specimen: Nasopharyngeal  Result Value Ref Range Status   MRSA by PCR NEGATIVE NEGATIVE Final    Comment:        The GeneXpert MRSA Assay (FDA approved for NASAL specimens only), is one component of a comprehensive MRSA colonization surveillance program. It is not intended to diagnose MRSA infection nor to guide or monitor treatment for MRSA infections. Performed at [facility], [place] [address], [place], [place] [address]          Radiology Studies: Dg Abd 1 View  Result Date: 04/14/2019 CLINICAL DATA:  History of hydronephrosis. EXAM: ABDOMEN - 1 VIEW COMPARISON:  CT abdomen pelvis 03/17/2019 FINDINGS: Paucity of small bowel gas. Gas within the stomach. Left lung bases clear. Osseous structures unremarkable. Supine evaluation limited for the detection of free intraperitoneal air. Chronic changes proximal left femur/greater trochanter. IMPRESSION: Paucity of small bowel gas without evidence for overt small bowel obstruction. Electronically Signed   By: Lovey Newcomer M.D.   On: 04/14/2019 17:46   US Renal  Result  Date: 04/15/2019 CLINICAL DATA:  Acute kidney injury. EXAM: RENAL / URINARY TRACT ULTRASOUND COMPLETE COMPARISON:  CT abdomen 03/17/2019 FINDINGS: Right Kidney: Renal measurements: 12.4 x 4.9 x 6.2 cm = volume: 194 mL . Echogenicity within normal limits. No mass or hydronephrosis visualized. Left Kidney: Renal measurements: 11.5 x 5.1 x 5.5 cm = volume: 169 mL. Echogenicity within normal limits. No mass or hydronephrosis visualized. Bladder: Foley catheter in place. The bladder is distended with urine, suggesting the catheter is not functioning. Floor nurse notified of this finding at the time of the scan. IMPRESSION: Normal appearance of the kidneys. Normal size and echogenicity. No hydronephrosis. Foley catheter in the bladder, but the bladder is full of urine, suggesting the catheter is not functioning. Floor nurse notified at the time of the scan. Electronically Signed   By: Nelson Chimes M.D.   On: 04/15/2019 06:58   Portable Chest 1 View  Result Date: 04/15/2019 CLINICAL DATA:  Shortness of breath.  COVID-19 positive. EXAM: PORTABLE CHEST 1 VIEW COMPARISON:  Chest x-ray dated April 13, 2019. FINDINGS: The heart size and mediastinal contours are within normal limits. Normal pulmonary vascularity. Mild patchy opacity at the left greater than right lung bases, more prominent when compared to prior study. No pleural effusion or pneumothorax. Chronic masslike opacity in the peripheral right upper lobe is unchanged. IMPRESSION: 1. Mild bibasilar patchy opacities, likely reflecting COVID-19 pneumonia. Electronically Signed   By: Titus Dubin M.D.   On:  04/15/2019 09:38        Scheduled Meds: . albuterol  2 puff Inhalation Q6H  . aspirin EC  81 mg Oral Daily  . heparin  5,000 Units Subcutaneous Q8H  . insulin aspart  0-5 Units Subcutaneous QHS  . insulin aspart protamine- aspart  25 Units Subcutaneous Q breakfast  . insulin aspart protamine- aspart  35 Units Subcutaneous Q supper  . methylPREDNISolone  (SOLU-MEDROL) injection  30 mg Intravenous Q12H  . multivitamin with minerals  1 tablet Oral Daily  . pantoprazole  40 mg Oral BID AC  . potassium chloride  40 mEq Oral Once  . vitamin C  500 mg Oral Daily  . zinc sulfate  220 mg Oral Daily   Continuous Infusions: . sodium chloride    . cefTRIAXone (ROCEPHIN)  IV       LOS: 2 days    Time spent: 32 minutes    Hosie Poisson, MD [facility] Pager [phone] If 7PM-7AM, please contact night-coverage [email or web address] Password [email or web address] 04/16/2019, 11:50 AM

## 2019-04-16 NOTE — Progress Notes (Signed)
Patient admits to abdominal discomfort, citing it as the reason he has not eaten much today. Zofran given for nausea, will follow up.   Adequate urine output for PO intake. Will re-evaluate after fluids have been restarted for a few hours.   Tolerating room air well, high 90s O2 saturations. Afebrile at the moment.   Will continue to monitor.  [place] Handler, RN 04/16/19 4:21 PM

## 2019-04-16 NOTE — Progress Notes (Signed)
  [facility] Progress Note   Assessment/ Plan:   #Acute kidney injury likely hemodynamically mediated in the setting of severe dehydration, hypotensiondue to N/V and decreased oral intake, +covid. Patient has history of diabetic gastroparesis. -The urinalysis with no RBC or WBC, protein 30 with UP/C only 0.9 g.  US renal showed normal echogenicity, no hydronephrosis. -Patient has increased urine output, 3.1 L in 24 hours and serum creatinine level trended down to 4.45--> 2.86.  IVFs stopped -Avoid nephrotoxins. Strict ins and out for monitoring urine output. -No indication for dialysis.  #Metabolic acidosis resolved  # hypokalemia: mild, suspect this will resolve after Na bicarb gtt stopped.  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 Recent baseline creatinine level around 1.5-2.  #Anemia:  Monitor CBC.  Iron saturation low however No iron because of bacteremia.  Monitor CBC.  #COVID 19+:Per primary team.  #Staph bacteremia and possible community-acquired pneumonia: Currently on ceftriaxone and azithromycin per primary team.  # Dispo: expect continued improvement.  We will sign off.  Please re-consult as needed.  Subjective:    Robust urine output.  Per notes, has some diarrhea as well.  Na up to 145 this AM, K down a little to 3.3, and Cr improved quite a bit to 2.86.[name]     Objective:   BP 118/84 (BP Location: Right Arm)   Pulse 70   Temp 97.9 F (36.6 C) (Oral)   Resp 18   Ht 5\' 8"  (1.727 m)   Wt 57.3 kg   SpO2 98%   BMI 19.21 kg/m   Physical Exam: Exam deferred in the setting of COVID-19.  Relying on physical exam from other providers.  Detailed chart and laboratory review performed.    Labs: BMET Recent Labs  Lab 04/13/19 2138 04/14/19 0316 04/14/19 1511 04/14/19 2346 04/15/19 0747 04/16/19 0751  NA 134* 138 138 133* 138 145  K 4.3 4.0 4.1 4.5 3.7 3.3*  CL 105 114* 114* 108 107 105  CO2 11* 13* 9* 9* 16* 25  GLUCOSE 347*  198* 220* 553* 198* 155*  BUN 74* 68* 62* 62* 55* 47*  CREATININE 7.75* 6.46* 6.04* 5.55* 4.45* 2.86*  CALCIUM 8.0* 7.1* 8.0* 8.0* 8.2* 8.0*  PHOS  --   --   --   --  4.6 4.0   CBC Recent Labs  Lab 04/13/19 2138 04/14/19 1511 04/15/19 0747 04/16/19 0751  WBC 4.6 3.8* 3.8* 7.9  NEUTROABS  --  2.2 3.3 7.2  HGB 9.1* 8.4*  8.2* 8.4* 8.9*  HCT 27.8* 26.1* 24.6* 26.2*  MCV 93.0 95.3 90.4 89.7  PLT 273 250 304 323    @IMGRELPRIORS @ Medications:    . albuterol  2 puff Inhalation Q6H  . aspirin EC  81 mg Oral Daily  . heparin  5,000 Units Subcutaneous Q8H  . insulin aspart  0-5 Units Subcutaneous QHS  . insulin aspart protamine- aspart  25 Units Subcutaneous Q breakfast  . insulin aspart protamine- aspart  35 Units Subcutaneous Q supper  . methylPREDNISolone (SOLU-MEDROL) injection  30 mg Intravenous Q12H  . multivitamin with minerals  1 tablet Oral Daily  . pantoprazole  40 mg Oral BID AC  . vitamin C  500 mg Oral Daily  . zinc sulfate  220 mg Oral Daily     Madelon Lips, MD [place] pgr [phone] 04/16/2019, 11:11 AM

## 2019-04-17 ENCOUNTER — Inpatient Hospital Stay (HOSPITAL_COMMUNITY): Payer: HRSA Program

## 2019-04-17 ENCOUNTER — Other Ambulatory Visit: Payer: Self-pay

## 2019-04-17 ENCOUNTER — Encounter (HOSPITAL_COMMUNITY): Payer: Self-pay | Admitting: Emergency Medicine

## 2019-04-17 DIAGNOSIS — E1165 Type 2 diabetes mellitus with hyperglycemia: Secondary | ICD-10-CM

## 2019-04-17 DIAGNOSIS — J15211 Pneumonia due to Methicillin susceptible Staphylococcus aureus: Secondary | ICD-10-CM

## 2019-04-17 DIAGNOSIS — K219 Gastro-esophageal reflux disease without esophagitis: Secondary | ICD-10-CM

## 2019-04-17 DIAGNOSIS — Z794 Long term (current) use of insulin: Secondary | ICD-10-CM

## 2019-04-17 LAB — CBC WITH DIFFERENTIAL/PLATELET
Abs Immature Granulocytes: 0.03 10*3/uL (ref 0.00–0.07)
Basophils Absolute: 0 10*3/uL (ref 0.0–0.1)
Basophils Relative: 0 %
Eosinophils Absolute: 0 10*3/uL (ref 0.0–0.5)
Eosinophils Relative: 0 %
HCT: 26 % — ABNORMAL LOW (ref 39.0–52.0)
Hemoglobin: 8.8 g/dL — ABNORMAL LOW (ref 13.0–17.0)
Immature Granulocytes: 0 %
Lymphocytes Relative: 5 %
Lymphs Abs: 0.3 10*3/uL — ABNORMAL LOW (ref 0.7–4.0)
MCH: 31.2 pg (ref 26.0–34.0)
MCHC: 33.8 g/dL (ref 30.0–36.0)
MCV: 92.2 fL (ref 80.0–100.0)
Monocytes Absolute: 0.2 10*3/uL (ref 0.1–1.0)
Monocytes Relative: 3 %
Neutro Abs: 6.4 10*3/uL (ref 1.7–7.7)
Neutrophils Relative %: 92 %
Platelets: 335 10*3/uL (ref 150–400)
RBC: 2.82 MIL/uL — ABNORMAL LOW (ref 4.22–5.81)
RDW: 14.2 % (ref 11.5–15.5)
WBC: 7 10*3/uL (ref 4.0–10.5)
nRBC: 0 % (ref 0.0–0.2)

## 2019-04-17 LAB — GASTROINTESTINAL PANEL BY PCR, STOOL (REPLACES STOOL CULTURE)

## 2019-04-17 LAB — COMPREHENSIVE METABOLIC PANEL
ALT: 22 U/L (ref 0–44)
AST: 23 U/L (ref 15–41)
Albumin: 2.6 g/dL — ABNORMAL LOW (ref 3.5–5.0)
Alkaline Phosphatase: 75 U/L (ref 38–126)
Anion gap: 11 (ref 5–15)
BUN: 33 mg/dL — ABNORMAL HIGH (ref 6–20)
CO2: 27 mmol/L (ref 22–32)
Calcium: 8.1 mg/dL — ABNORMAL LOW (ref 8.9–10.3)
Chloride: 104 mmol/L (ref 98–111)
Creatinine, Ser: 1.92 mg/dL — ABNORMAL HIGH (ref 0.61–1.24)
GFR calc Af Amer: 48 mL/min — ABNORMAL LOW (ref >60)
GFR calc non Af Amer: 42 mL/min — ABNORMAL LOW (ref >60)
Glucose, Bld: 42 mg/dL — CL (ref 70–99)
Potassium: 3.4 mmol/L — ABNORMAL LOW (ref 3.5–5.1)
Sodium: 142 mmol/L (ref 135–145)
Total Bilirubin: 0.6 mg/dL (ref 0.3–1.2)
Total Protein: 6.1 g/dL — ABNORMAL LOW (ref 6.5–8.1)

## 2019-04-17 LAB — PHOSPHORUS: Phosphorus: 2.8 mg/dL (ref 2.5–4.6)

## 2019-04-17 LAB — GLUCOSE, CAPILLARY
Glucose-Capillary: 40 mg/dL — CL (ref 70–99)
Glucose-Capillary: 49 mg/dL — ABNORMAL LOW (ref 70–99)
Glucose-Capillary: 183 mg/dL — ABNORMAL HIGH (ref 70–99)
Glucose-Capillary: 225 mg/dL — ABNORMAL HIGH (ref 70–99)
Glucose-Capillary: 257 mg/dL — ABNORMAL HIGH (ref 70–99)
Glucose-Capillary: 251 mg/dL — ABNORMAL HIGH (ref 70–99)

## 2019-04-17 LAB — D-DIMER, QUANTITATIVE: D-Dimer, Quant: 1.54 ug/mL-FEU — ABNORMAL HIGH (ref 0.00–0.50)

## 2019-04-17 LAB — MAGNESIUM: Magnesium: 1.8 mg/dL (ref 1.7–2.4)

## 2019-04-17 LAB — FERRITIN: Ferritin: 459 ng/mL — ABNORMAL HIGH (ref 24–336)

## 2019-04-17 LAB — HEPATITIS B SURFACE ANTIGEN: Hepatitis B Surface Ag: NEGATIVE

## 2019-04-17 LAB — LEGIONELLA PNEUMOPHILA SEROGP 1 UR AG: L. pneumophila Serogp 1 Ur Ag: NEGATIVE

## 2019-04-17 LAB — C-REACTIVE PROTEIN: CRP: 2.9 mg/dL — ABNORMAL HIGH (ref <1.0)

## 2019-04-17 LAB — LIPASE, BLOOD: Lipase: 86 U/L — ABNORMAL HIGH (ref 11–51)

## 2019-04-17 LAB — CK: Total CK: 79 U/L (ref 49–397)

## 2019-04-17 LAB — INTERLEUKIN-6, PLASMA: Interleukin-6, Plasma: 20.1 pg/mL — ABNORMAL HIGH (ref 0.0–12.2)

## 2019-04-17 LAB — TRIGLYCERIDES: Triglycerides: 83 mg/dL (ref <150)

## 2019-04-17 IMAGING — CT CT ABDOMEN AND PELVIS WITHOUT CONTRAST
2 of 4 series · 16 of 46 positions shown, 18 images · non-contrast
Comparison: [DATE]

CLINICAL DATA: Abdominal pain, nausea, vomiting, and diarrhea.
Acute renal failure. [NN] positive. History of diabetes.

EXAM:
CT ABDOMEN AND PELVIS WITHOUT CONTRAST
TECHNIQUE: Multidetector CT imaging of the abdomen and pelvis was performed
following the standard protocol without IV contrast.

[Series 3: abd/ pelvis 5.0 i30f 2 · axial · 0.66mm/px · z∈[+734,+1164]mm · 13 of 96 slices shown, 15 images]
[im 5/96  soft-tissue]
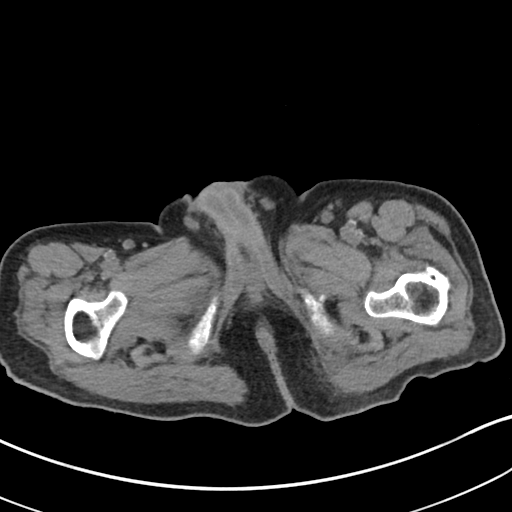
[im 5/96  bone]
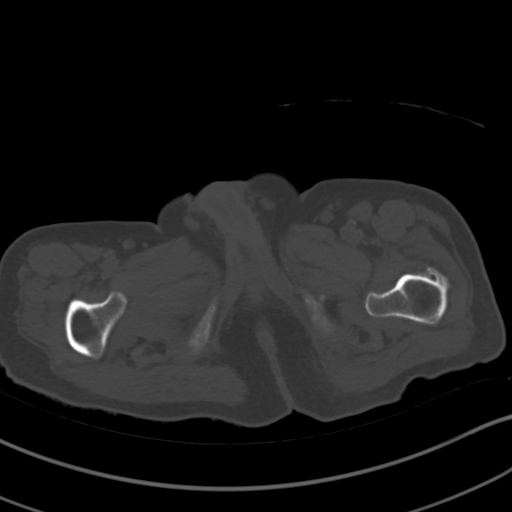
[im 13/96  soft-tissue]
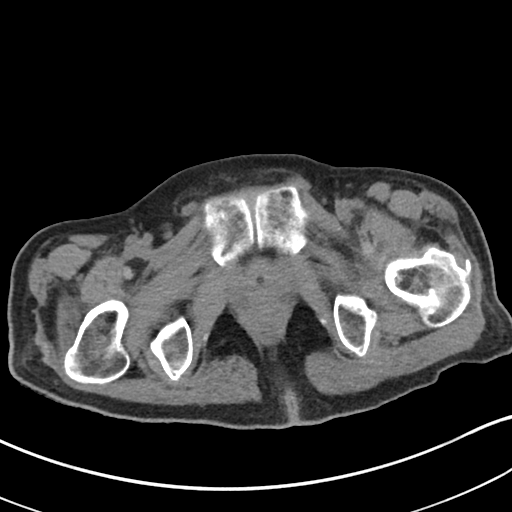
[im 21/96  soft-tissue]
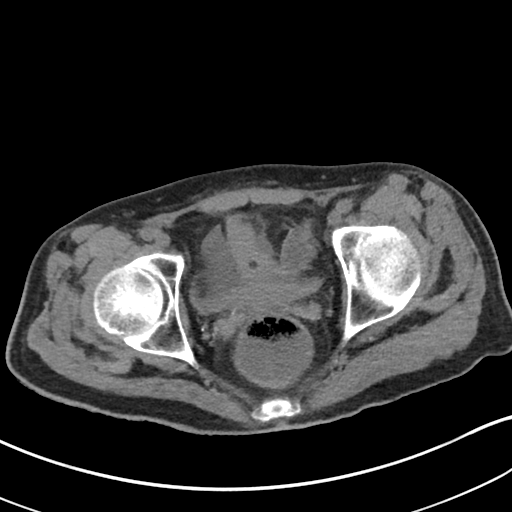
[im 25/96  soft-tissue]
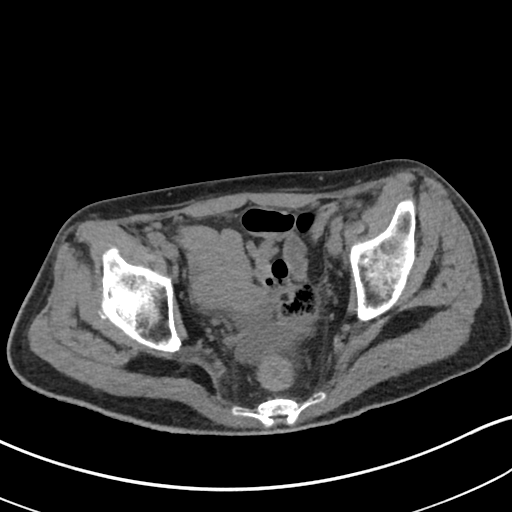
[im 34/96  soft-tissue]
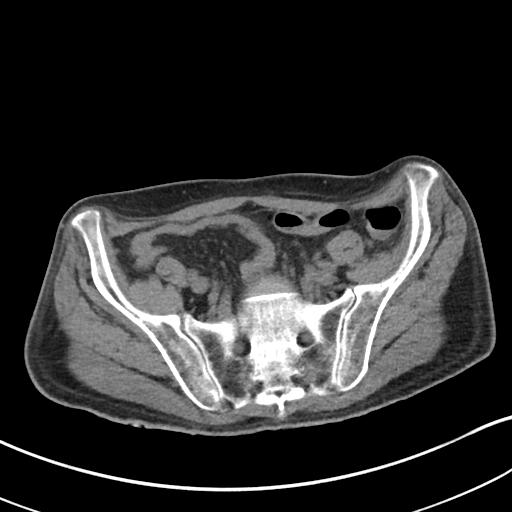
[im 42/96  soft-tissue]
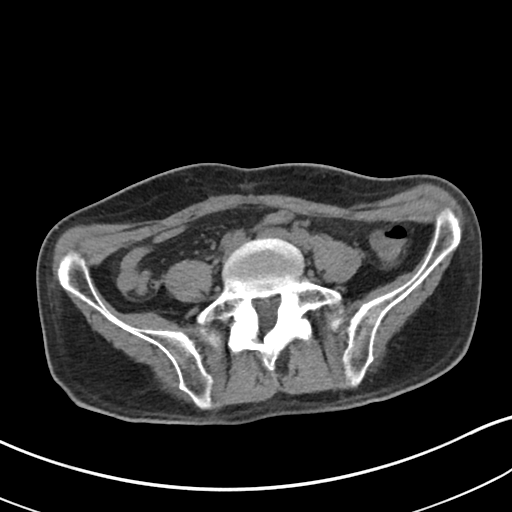
[im 50/96  soft-tissue]
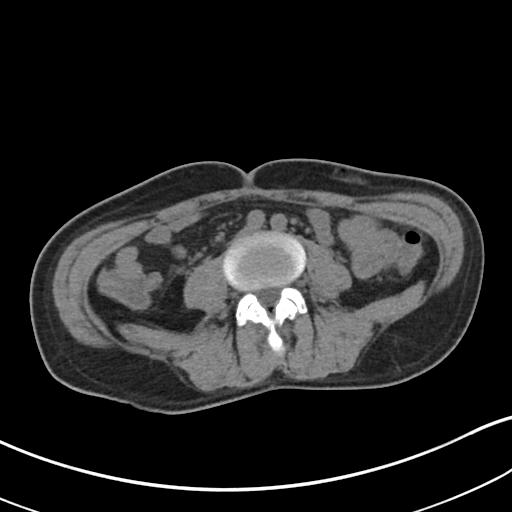
[im 54/96  soft-tissue]
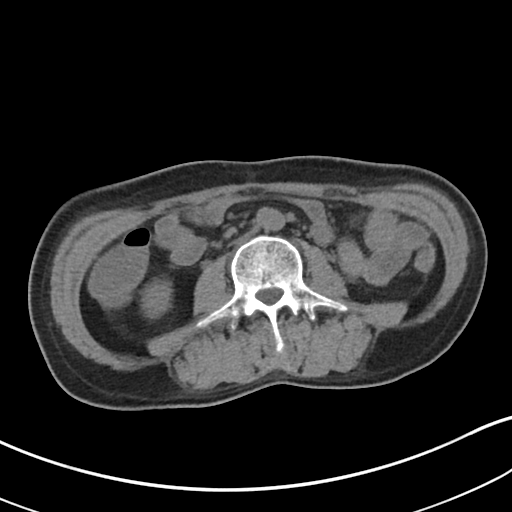
[im 62/96  soft-tissue]
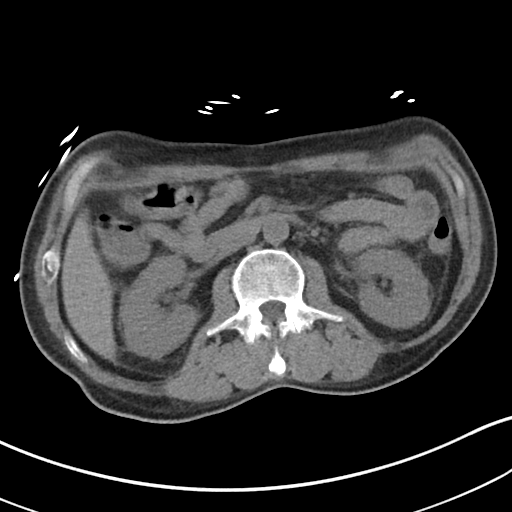
[im 62/96  bone]
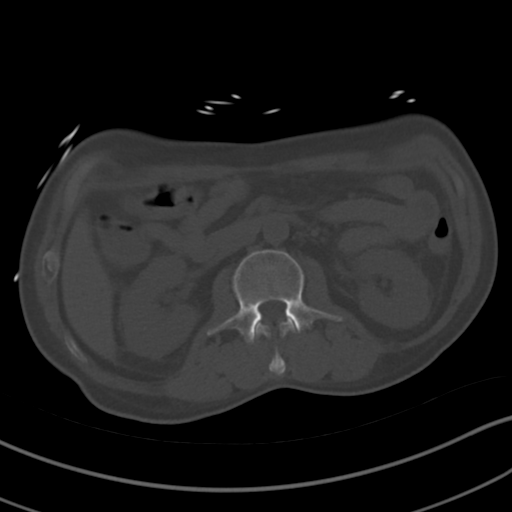
[im 71/96  soft-tissue]
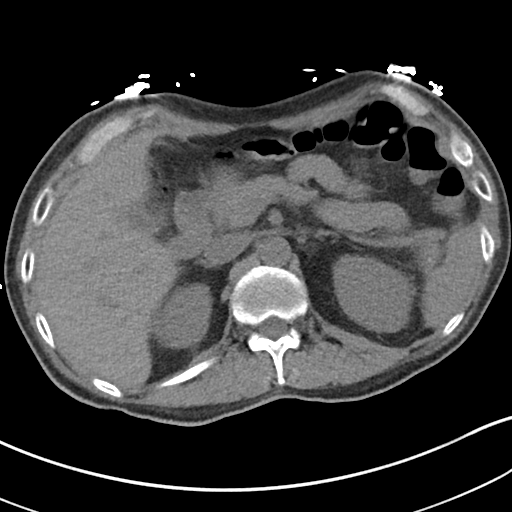
[im 75/96  soft-tissue]
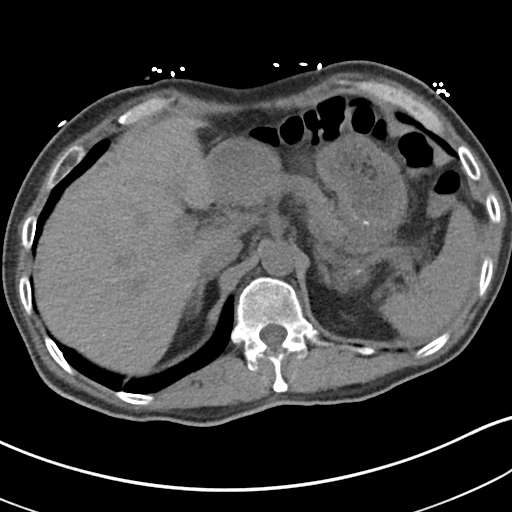
[im 83/96  soft-tissue]
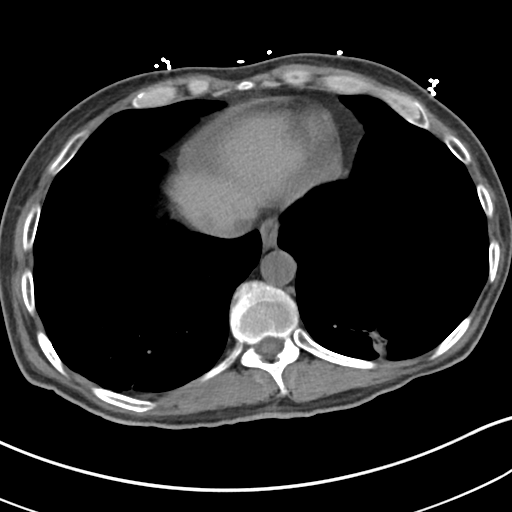
[im 91/96  soft-tissue]
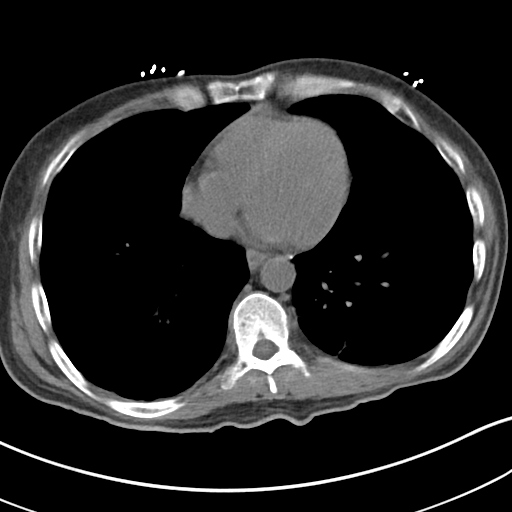

[Series 6: cor st · coronal · 0.76mm/px · 3 of 85 slices shown]
[im 29/85  soft-tissue]
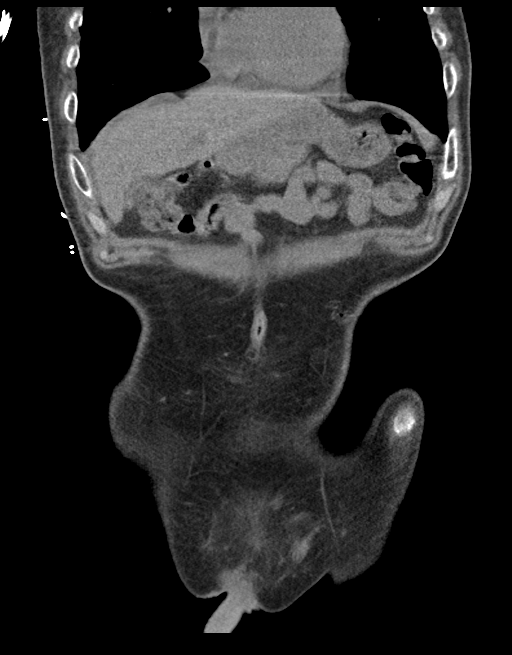
[im 38/85  soft-tissue]
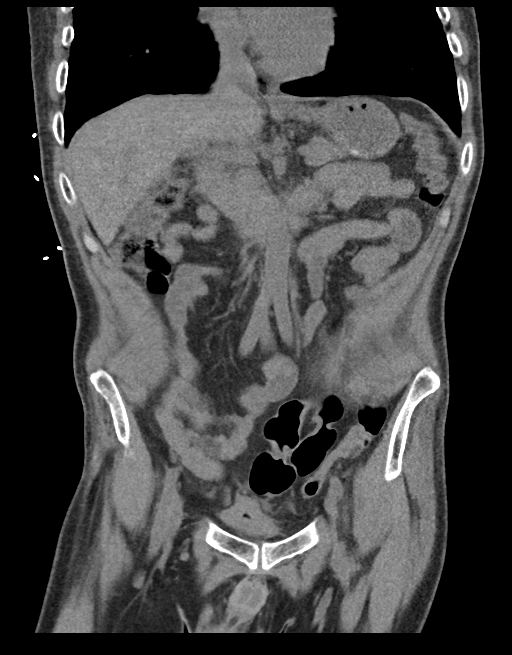
[im 47/85  soft-tissue]
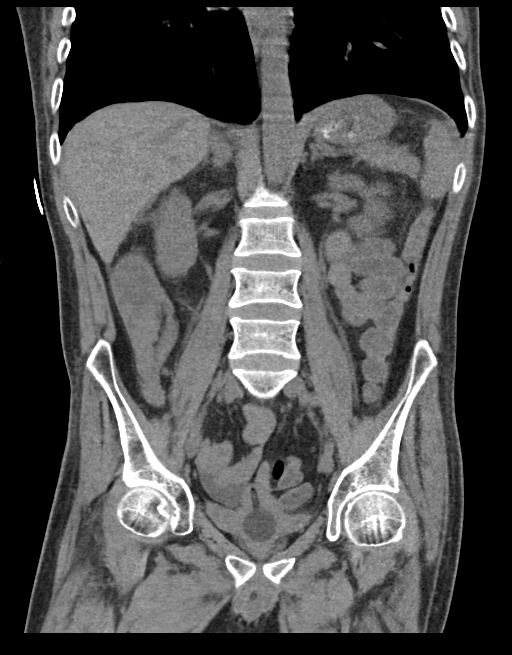

[16 of 46 positions shown; findings below may reference images not displayed]

FINDINGS: Lower chest: Mild bibasilar atelectasis and/or scarring. No pleural
effusion. Normal heart size.

Hepatobiliary: No focal liver abnormality is seen. There is new
hyperdense material in an underdistended gallbladder which may
reflect sludge. There are no regional inflammatory changes. No
biliary dilatation is seen.

Pancreas: Unremarkable.

Spleen: Unremarkable.

Adrenals/Urinary Tract: Unremarkable adrenal glands. No renal
calculi or hydronephrosis. Decompressed bladder with a Foley
catheter in place.

Stomach/Bowel: The stomach is unremarkable. There is no evidence of
bowel obstruction. Liquid stool is present in the rectum consistent
with the history of a diarrheal illness. Mild sigmoid colon
diverticulosis is noted without evidence of diverticulitis. The
appendix is unremarkable.

Vascular/Lymphatic: Normal caliber of the abdominal aorta. No
enlarged lymph nodes.

Reproductive: Unremarkable prostate.

Other: No intraperitoneal free fluid or pneumoperitoneum. Mild
stranding and small foci of gas in the anterior abdominal wall
likely reflective of subcutaneous medication injection.

Musculoskeletal: No acute osseous abnormality or suspicious osseous
lesion. Suspected healed pars defects bilaterally at L5 with
unchanged grade 1 anterolisthesis of L5 on S1. Old fractures of the
left ischium and proximal left femur.
IMPRESSION: 1. No acute abnormality identified in the abdomen or pelvis.
2. Liquid stool in the rectum consistent with the history of a
diarrheal illness.
3. Suspected gallbladder sludge.
4. Mild sigmoid colon diverticulosis without evidence of
diverticulitis.

## 2019-04-17 MED ORDER — INSULIN ASPART PROT & ASPART (70-30 MIX) 100 UNIT/ML ~~LOC~~ SUSP
10.0000 [IU] | Freq: Every day | SUBCUTANEOUS | Status: DC
  Filled 2019-04-17: qty 10

## 2019-04-17 MED ORDER — INSULIN ASPART PROT & ASPART (70-30 MIX) 100 UNIT/ML ~~LOC~~ SUSP
20.0000 [IU] | Freq: Every day | SUBCUTANEOUS | Status: DC
  Administered 2019-04-17: 20 [IU] via SUBCUTANEOUS
  Filled 2019-04-17: qty 10

## 2019-04-17 MED ORDER — POTASSIUM CHLORIDE CRYS ER 20 MEQ PO TBCR
40.0000 meq | EXTENDED_RELEASE_TABLET | Freq: Once | ORAL | Status: AC
  Administered 2019-04-17: 40 meq via ORAL
  Filled 2019-04-17: qty 2

## 2019-04-17 MED ORDER — MUPIROCIN CALCIUM 2 % EX CREA
TOPICAL_CREAM | Freq: Every day | CUTANEOUS | Status: DC
  Administered 2019-04-17 – 2019-04-20 (×4): via TOPICAL
  Filled 2019-04-17: qty 15

## 2019-04-17 NOTE — Progress Notes (Signed)
PT Cancellation Note  Patient Details Name: Gregory Moody MRN: [id number] DOB: [birth year]   Cancelled Treatment:    Reason Eval/Treat Not Completed: Patient declined, no reason specified   [place] 04/17/2019, 11:23 AM [place] Pager [phone] Office [phone]

## 2019-04-17 NOTE — Consult Note (Signed)
I have placed a request via Secure Chat to Dr. Karleen Hampshire requesting photos of the wound areas of concern to be placed in the EMR.   \Demon Volante [place], [place], [place]

## 2019-04-17 NOTE — Progress Notes (Signed)
Pt denies pain/respiratory distress. Educated on incentive, sleeping prone position, and either sides. Pt notes his daughter stays with him on weekend. He uses a quad cane and checks his blood sugar regularly.

## 2019-04-17 NOTE — Progress Notes (Signed)
PT Cancellation Note  Patient Details Name: Gregory Moody MRN: [id number] DOB: Sep 08, 1976   Cancelled Treatment:    Reason Eval/Treat Not Completed: Other (comment). Attempted for 2nd time today. Pt again refused. Used video interpreter [name] [phone]. Pt adamant that he was too weak to work with PT because he hadn't been able to keep any food down. Pt had questions about why he couldn't keep food down and if he was still going to the other hospital. Will reattempt at [facility].    [place] 04/17/2019, 4:19 PM [place] Pager [phone] Office [phone]

## 2019-04-17 NOTE — Plan of Care (Signed)
  Problem: Education: Goal: Knowledge of General Education information will improve Description: Including pain rating scale, medication(s)/side effects and non-pharmacologic comfort measures Outcome: Progressing   Problem: Education: Goal: Knowledge of General Education information will improve Description: Including pain rating scale, medication(s)/side effects and non-pharmacologic comfort measures Outcome: Progressing   Problem: Health Behavior/Discharge Planning: Goal: Ability to manage health-related needs will improve Outcome: Progressing   Problem: Clinical Measurements: Goal: Ability to maintain clinical measurements within normal limits will improve Outcome: Progressing   Problem: Education: Goal: Knowledge of risk factors and measures for prevention of condition will improve Outcome: Progressing   Problem: Coping: Goal: Psychosocial and spiritual needs will be supported Outcome: Progressing   Problem: Respiratory: Goal: Will maintain a patent airway Outcome: Progressing Goal: Complications related to the disease process, condition or treatment will be avoided or minimized Outcome: Progressing

## 2019-04-17 NOTE — Progress Notes (Signed)
PROGRESS NOTE    Gregory Moody  HCW:[id number] DOB: 1976-05-06 DOA: 04/14/2019 PCP: [name]    Brief Narrative:  43 year old gentleman with prior history of insulin-dependent diabetes mellitus, diabetic gastro paresis presents to ED with nausea vomiting and generalized weakness for 2 days.  On arrival patient was found to be hypotensive.  He was also tested positive for coronavirus at [facility] and found to be in acute on stage III CKD.  [facility] was admitted to [facility] and nephrology consulted. AKI probably secondary to pre renal azotemia from severe dehydration from nausea, vomiting and poor oral intake and active COVID 19 infection.  Renal parameters have improved with hydration, initially planned to d/c patient home on 04/17/2019  but patient started having recurrent nausea, vomiting and watery diarrhea and plan to transfer the patient to [facility] for further evaluation and management.     Assessment & Plan:   Principal Problem:   Acute renal failure (ARF) (HCC) Active Problems:   Diabetes mellitus with hyperglycemia (HCC)   GERD (gastroesophageal reflux disease)   Diabetic gastroparesis (HCC)   Nausea and vomiting   CAP (community acquired pneumonia) due to MSSA (methicillin sensitive Staphylococcus aureus) ([place])   COVID-19 virus infection     Acute on stage II, 3 CKD secondary to prerenal/severe dehydration, poor oral intake, and  from COVID-19 infection.  Improving renal parameters with IV hydration and IV bicarbonate.  Nephrology consulted and recommendations given.  Since renal parameters improved, renal signed off.  Avoid nephrotoxins and NSAIDs at this time. Creatinine improved from 4 to 1.92  Urine output adequate and about 2 it in the last 24 hours.   Staph hominis bacteremia coag negative Sensitive to rocephin.    Hypokalemia and hypomagnesemia:  Replaced.    Active COVID-19 viral infection Continue with Solu-Medrol.  Patient  is currently not hypoxic.  His oxygen saturations greater than 90% on room air. CXR shows overall improvement in the bilateral opacities and improved aeration. Inflammatory markers are improving.  His nausea, vomiting and diarrhea  initially resolved today he had one large emesis and one episode of watery diarrhea.     Type 2 diabetes mellitus with hyperglycemia Gap is closed, bicarb improved to 27  CBG (last 3)  Recent Labs    04/17/19 0830 04/17/19 0937 04/17/19 1137  GLUCAP 49* 183* 225*   With recurrent episodes of hypoglycemia, halved the dose of novolog 70/30 .  Resume SSI.    Diabetic gastroparesis with nausea and vomiting Antiemetics and gentle hydration.   Watery diarrhea possibly secondary to active COVID-19 infection. GI pathogen pcr is sent.    Mild acute pancreatitis:  Improving lipase but he has recurrent nausea, vomiting and watery diarrhea. He is back on clears for now.     Anemia of chronic disease:  Hemoglobin stable around 8.    Large abrasion on the left knee.  Wound care consult.    GERD continue with PPI   DVT prophylaxis: Heparin Code Status: Full code Family Communication: None at bedside  disposition Plan: Pending clinical improvement/ transfer to gvc.    Consultants:   Nephrology  Procedures: None Antimicrobials: Rocephin and Zithromax to rocephin alone.    Subjective: large emesis today with watery bowel movement.   Objective: Vitals:   04/17/19 0116 04/17/19 0632 04/17/19 0830 04/17/19 1139  BP: 109/80  97/71 114/70  Pulse: 68  68 80  Resp:   16 14  Temp: 98.1 F (36.7 C)  97.6 F (36.4 C)  98 F (36.7 C)  TempSrc: Oral  Oral Oral  SpO2:   97% 98%  Weight:  61.2 kg    Height:        Intake/Output Summary (Last 24 hours) at 04/17/2019 1208 Last data filed at 04/17/2019 0831 Gross per 24 hour  Intake 1630.04 ml  Output 1800 ml  Net -169.96 ml   Filed Weights   04/14/19 1103 04/17/19 0632  Weight: 57.3 kg 61.2  kg    Examination:  General exam: Appears calm and comfortable  Respiratory system: Clear to auscultation. Respiratory effort normal. No wheezing or rhonchi.  Cardiovascular system: S1 & S2 heard, RRR. No JVD,  Gastrointestinal system: Abdomen is soft, mildly tender, nondistended, bowel sounds are good Central nervous system: Alert and oriented.  Grossly nonfocal Extremities: left knee abrasion. Mild tenderness.  Skin: left knee abrasion.  Psychiatry:  Mood appropriate.     Data Reviewed: I have personally reviewed following labs and imaging studies  CBC: Recent Labs  Lab 04/13/19 2138 04/14/19 1511 04/15/19 0747 04/16/19 0751 04/17/19 0655  WBC 4.6 3.8* 3.8* 7.9 7.0  NEUTROABS  --  2.2 3.3 7.2 6.4  HGB 9.1* 8.4*   8.2* 8.4* 8.9* 8.8*  HCT 27.8* 26.1* 24.6* 26.2* 26.0*  MCV 93.0 95.3 90.4 89.7 92.2  PLT 273 250 304 323 287   Basic Metabolic Panel: Recent Labs  Lab 04/14/19 1511 04/14/19 2346 04/15/19 0747 04/16/19 0751 04/17/19 0655  NA 138 133* 138 145 142  K 4.1 4.5 3.7 3.3* 3.4*  CL 114* 108 107 105 104  CO2 9* 9* 16* 25 27  GLUCOSE 220* 553* 198* 155* 42*  BUN 62* 62* 55* 47* 33*  CREATININE 6.04* 5.55* 4.45* 2.86* 1.92*  CALCIUM 8.0* 8.0* 8.2* 8.0* 8.1*  MG  --   --  1.9 1.6* 1.8  PHOS  --   --  4.6 4.0 2.8   GFR: Estimated Creatinine Clearance: 42.9 mL/min (A) (by C-G formula based on SCr of 1.92 mg/dL (H)). Liver Function Tests: Recent Labs  Lab 04/13/19 2138 04/14/19 1511 04/15/19 0747 04/16/19 0751 04/17/19 0655  AST 43* 28 23 28 23   ALT 51* 41 33 28 22  ALKPHOS 89 82 85 82 75  BILITOT 0.7 0.7 0.6 0.6 0.6  PROT 7.6 6.6 6.6 6.6 6.1*  ALBUMIN 3.4* 2.8* 2.8* 2.7* 2.6*   Recent Labs  Lab 04/13/19 2138 04/16/19 0751 04/17/19 0655  LIPASE 289* 106* 86*   No results for input(s): AMMONIA in the last 168 hours. Coagulation Profile: No results for input(s): INR, PROTIME in the last 168 hours. Cardiac Enzymes: Recent Labs  Lab  04/15/19 0747 04/16/19 0751 04/17/19 0655  CKTOTAL 175 162 79   BNP (last 3 results) No results for input(s): PROBNP in the last 8760 hours. HbA1C: No results for input(s): HGBA1C in the last 72 hours. CBG: Recent Labs  Lab 04/16/19 2103 04/17/19 0817 04/17/19 0830 04/17/19 0937 04/17/19 1137  GLUCAP 114* 40* 49* 183* 225*   Lipid Profile: Recent Labs    04/16/19 0751 04/17/19 0655  TRIG 121 83   Thyroid Function Tests: No results for input(s): TSH, T4TOTAL, FREET4, T3FREE, THYROIDAB in the last 72 hours. Anemia Panel: Recent Labs    04/15/19 0747 04/16/19 0751 04/17/19 0655  FERRITIN 613* 529* 459*  TIBC 220*  --   --   IRON 31*  --   --    Sepsis Labs: Recent Labs  Lab 04/13/19 2315 04/14/19 1511  PROCALCITON  --  2.13  LATICACIDVEN 0.8  --     Recent Results (from the past 240 hour(s))  SARS Coronavirus 2 (CEPHEID- Performed in [place] hospital lab), Hosp Order     Status: Abnormal   Collection Time: 04/13/19 10:02 PM   Specimen: Nasopharyngeal Swab  Result Value Ref Range Status   SARS Coronavirus 2 POSITIVE (A) NEGATIVE Final    Comment: RESULT CALLED TO, READ BACK BY AND VERIFIED WITH: RACHEL HAYDEN ON 04/13/2019 AT 2256 QSD (NOTE) If result is NEGATIVE SARS-CoV-2 target nucleic acids are NOT DETECTED. The SARS-CoV-2 RNA is generally detectable in upper and lower  respiratory specimens during the acute phase of infection. The lowest  concentration of SARS-CoV-2 viral copies this assay can detect is 250  copies / mL. A negative result does not preclude SARS-CoV-2 infection  and should not be used as the sole basis for treatment or other  patient management decisions.  A negative result may occur with  improper specimen collection / handling, submission of specimen other  than nasopharyngeal swab, presence of viral mutation(s) within the  areas targeted by this assay, and inadequate number of viral copies  (<250 copies / mL). A negative result  must be combined with clinical  observations, patient history, and epidemiological information. If result is POSITIVE SARS-CoV-2 target nucleic acids are DETECTED.  The SARS-CoV-2 RNA is generally detectable in upper and lower  respiratory specimens during the acute phase of infection.  Positive  results are indicative of active infection with SARS-CoV-2.  Clinical  correlation with patient history and other diagnostic information is  necessary to determine patient infection status.  Positive results do  not rule out bacterial infection or co-infection with other viruses. If result is PRESUMPTIVE POSTIVE SARS-CoV-2 nucleic acids MAY BE PRESENT.   A presumptive positive result was obtained on the submitted specimen  and confirmed on repeat testing.  While 2019 novel coronavirus  (SARS-CoV-2) nucleic acids may be present in the submitted sample  additional confirmatory testing may be necessary for epidemiological  and / or clinical management purposes  to differentiate between  SARS-CoV-2 and other Sarbecovirus currently known to infect humans.  If clinically indicated additional testing with an alternate test  methodology [id number])  is advised. The SARS-CoV-2 RNA is generally  detectable in upper and lower respiratory specimens during the acute  phase of infection. The expected result is Negative. Fact Sheet for Patients:  StrictlyIdeas.no Fact Sheet for Healthcare Providers: BankingDealers.co.za This test is not yet approved or cleared by the [place] FDA and has been authorized for detection and/or diagnosis of SARS-CoV-2 by FDA under an Emergency Use Authorization (EUA).  This EUA will remain in effect (meaning this test can be used) for the duration of the COVID-19 declaration under Section 564(b)(1) of the Act, 21 U.S.C. section 360bbb-3(b)(1), unless the authorization is terminated or revoked sooner. Performed at [facility], [address] [place], [place] [address]   Culture, blood (routine x 2)     Status: Abnormal   Collection Time: 04/13/19 11:15 PM   Specimen: BLOOD  Result Value Ref Range Status   Specimen Description   Final    BLOOD LEFT ANTECUBITAL Performed at [facility], [address], [place], [place] [address]    Special Requests   Final    BOTTLES DRAWN AEROBIC AND ANAEROBIC Blood Culture adequate volume Performed at [facility], [address], [place], [place] [address]    Culture  Setup Time   Final  GRAM POSITIVE COCCI AEROBIC BOTTLE ONLY CRITICAL RESULT CALLED TO, READ BACK BY AND VERIFIED WITH: BRANDY DAVIS AT 3646 ON 04/14/2019 [place]. ALSO CALLED TO KAREN PATTON PHARMD AT [facility] AT 8032 ON 04/14/2019 [place]. [place] Performed at [facility], [place] [address] [place], [place] [address]    Culture STAPHYLOCOCCUS HOMINIS (A)  Final   Report Status 04/16/2019 FINAL  Final   Organism ID, Bacteria STAPHYLOCOCCUS HOMINIS  Final      Susceptibility   Staphylococcus hominis - MIC*    CIPROFLOXACIN <=0.5 SENSITIVE Sensitive     ERYTHROMYCIN >=8 RESISTANT Resistant     GENTAMICIN <=0.5 SENSITIVE Sensitive     OXACILLIN <=0.25 SENSITIVE Sensitive     TETRACYCLINE <=1 SENSITIVE Sensitive     VANCOMYCIN 1 SENSITIVE Sensitive     TRIMETH/SULFA <=10 SENSITIVE Sensitive     CLINDAMYCIN <=0.25 SENSITIVE Sensitive     RIFAMPIN <=0.5 SENSITIVE Sensitive     Inducible Clindamycin NEGATIVE Sensitive     * STAPHYLOCOCCUS HOMINIS  Culture, blood (routine x 2)     Status: Abnormal   Collection Time: 04/13/19 11:15 PM   Specimen: BLOOD  Result Value Ref Range Status   Specimen Description   Final    BLOOD LEFT FOREARM Performed at [facility], [address], [place], [place] [address]    Special Requests   Final    BOTTLES DRAWN AEROBIC AND ANAEROBIC Blood Culture adequate volume Performed at [facility], [address] [place], [place] [address]    Culture  Setup Time   Final    GRAM POSITIVE COCCI AEROBIC BOTTLE ONLY CRITICAL RESULT CALLED TO, READ BACK BY AND VERIFIED WITH: BRANDY DAVIS AT 1707 ON 04/14/2019 [place]. ALSO CALLED TO KAREN PATTON PHARMD AT [facility] AT 8916 ON 04/14/2019 [place]. GRAM STAIN REVIEWED-AGREE WITH RESULT T. TYSOR    Culture (A)  Final    STAPHYLOCOCCUS HOMINIS SUSCEPTIBILITIES PERFORMED ON PREVIOUS CULTURE WITHIN THE LAST 5 DAYS. Performed at [facility], [place] [address] [place], [place] [address]    Report Status 04/16/2019 FINAL  Final  Blood Culture ID Panel (Reflexed)     Status: Abnormal   Collection Time: 04/13/19 11:15 PM  Result Value Ref Range Status   Enterococcus species NOT DETECTED NOT DETECTED Final   Listeria monocytogenes NOT DETECTED NOT DETECTED Final   Staphylococcus species DETECTED (A) NOT DETECTED Final    Comment: Methicillin (oxacillin) susceptible coagulase negative staphylococcus. Possible blood culture contaminant (unless isolated from more than one blood culture draw or clinical case suggests pathogenicity). No antibiotic treatment is indicated for blood  culture contaminants. CRITICAL RESULT CALLED TO, READ BACK BY AND VERIFIED WITH: KAREN PATTON ON 04/14/19 AT 1711 [facility]    Staphylococcus aureus (BCID) NOT DETECTED NOT DETECTED Final   Methicillin resistance NOT DETECTED NOT DETECTED Final   Streptococcus species NOT DETECTED NOT DETECTED Final   Streptococcus agalactiae NOT DETECTED NOT DETECTED Final   Streptococcus pneumoniae NOT DETECTED NOT DETECTED Final   Streptococcus pyogenes NOT DETECTED NOT DETECTED Final   Acinetobacter baumannii NOT DETECTED NOT DETECTED Final   Enterobacteriaceae species NOT DETECTED NOT DETECTED Final   Enterobacter cloacae complex NOT DETECTED NOT DETECTED Final   Escherichia coli NOT DETECTED NOT DETECTED Final   Klebsiella oxytoca NOT DETECTED NOT DETECTED Final   Klebsiella  pneumoniae NOT DETECTED NOT DETECTED Final   Proteus species NOT DETECTED NOT DETECTED Final   Serratia marcescens NOT  DETECTED NOT DETECTED Final   Haemophilus influenzae NOT DETECTED NOT DETECTED Final   Neisseria meningitidis NOT DETECTED NOT DETECTED Final   Pseudomonas aeruginosa NOT DETECTED NOT DETECTED Final   Candida albicans NOT DETECTED NOT DETECTED Final   Candida glabrata NOT DETECTED NOT DETECTED Final   Candida krusei NOT DETECTED NOT DETECTED Final   Candida parapsilosis NOT DETECTED NOT DETECTED Final   Candida tropicalis NOT DETECTED NOT DETECTED Final    Comment: Performed at [facility], [address] [place], [place] [address]  Culture, blood (Routine X 2) w Reflex to ID Panel     Status: None (Preliminary result)   Collection Time: 04/14/19  3:15 PM   Specimen: BLOOD  Result Value Ref Range Status   Specimen Description BLOOD BLOOD RIGHT FOREARM  Final   Special Requests   Final    BOTTLES DRAWN AEROBIC ONLY Blood Culture adequate volume   Culture   Final    NO GROWTH 3 DAYS Performed at [facility], [place] [address] [place], [place] [address]    Report Status PENDING  Incomplete  Culture, blood (Routine X 2) w Reflex to ID Panel     Status: None (Preliminary result)   Collection Time: 04/14/19  3:15 PM   Specimen: BLOOD RIGHT HAND  Result Value Ref Range Status   Specimen Description BLOOD RIGHT HAND  Final   Special Requests   Final    BOTTLES DRAWN AEROBIC ONLY Blood Culture adequate volume   Culture   Final    NO GROWTH 3 DAYS Performed at [facility], [place] [address], [place], [place] [address]    Report Status PENDING  Incomplete  MRSA PCR Screening     Status: None   Collection Time: 04/14/19  7:07 PM   Specimen: Nasopharyngeal  Result Value Ref Range Status   MRSA by PCR NEGATIVE NEGATIVE Final    Comment:        The GeneXpert MRSA Assay (FDA approved for NASAL specimens only), is one component of a comprehensive MRSA  colonization surveillance program. It is not intended to diagnose MRSA infection nor to guide or monitor treatment for MRSA infections. Performed at [facility], [place] [address] [place], [place] [address]          Radiology Studies: Dg Chest [place] 1 View  Result Date: 04/16/2019 CLINICAL DATA:  43 year old male with pulmonary opacities presenting for follow-up. EXAM: PORTABLE CHEST 1 VIEW COMPARISON:  Earlier radiograph dated 04/15/2019 and chest radiograph dated 07/03 2 FINDINGS: Left lung base streaky densities appear slightly improved since the earlier radiograph of 04/15/2019. Stable area of opacity in the right upper lobe along the minor fissure again noted. No pleural effusion or pneumothorax. The cardiac silhouette is within normal limits. No acute osseous pathology. IMPRESSION: Overall slight interval improvement of bibasilar densities since the earlier radiograph. Electronically Signed   By: Anner Crete M.D.   On: 04/16/2019 14:16        Scheduled Meds:  albuterol  2 puff Inhalation Q6H   aspirin EC  81 mg Oral Daily   heparin  5,000 Units Subcutaneous Q8H   insulin aspart  0-5 Units Subcutaneous QHS   [START ON 04/18/2019] insulin aspart protamine- aspart  10 Units Subcutaneous Q breakfast   insulin aspart protamine- aspart  20 Units Subcutaneous Q supper   methylPREDNISolone (SOLU-MEDROL) injection  30 mg Intravenous Q12H   multivitamin with minerals  1 tablet Oral Daily  mupirocin cream   Topical Daily   pantoprazole  40 mg Oral BID AC   vitamin C  500 mg Oral Daily   zinc sulfate  220 mg Oral Daily   Continuous Infusions:  sodium chloride 75 mL/hr at 04/17/19 0316   cefTRIAXone (ROCEPHIN)  IV 2 g (04/17/19 1201)     LOS: 3 days    Time spent: 32 minutes    Hosie Poisson, MD [facility] Pager [phone] If 7PM-7AM, please contact night-coverage [email or web address] Password [email or web address] 04/17/2019, 12:08 PM

## 2019-04-17 NOTE — Progress Notes (Signed)
Daily Nursing Note  Received report from [place], Therapist, sports. Patient assessed with no complaints. Spanish speaking, interpreter needed for thorough assessment. Lab called to endorse critical glucose level of 42 --> Dr. Karleen Hampshire informed and AM novolog 70/30 held. Patient given orange juice w/ correction to 183. In late morning had large volume emesis for which was given zofran. Integumentary exam reveals large L knee abrasion with notable exudate --> sent for MRSA/anerobic screen, wound consult, and Bactroban cream applied. Penile head very sensitive upon foley care completed by [facility], NT, noted to have excoriation there presumed from prior condom catheter.  CT of abd and pelvis completed this afternoon w/o acute findings. Enteric stool pathogen sent to microbiology lab for processing. Patient refused PT as he stated he felt too weak. We spent quite a while with the interpreter clarifying why the patient was in the hospital and what to anticipate moving forward. Able to tolerate jello and vanilla pudding this evening, PM novolog 70/30 was given as afternoon and evening BS increased 225 --> 257. All patient needs me throughout the day.   Daily Updates: Patients daughter, Tracie Harrier called to update however did not answer, therefore a VM was left.   DC Plan: Transfer to [facility] this evening, report given to [facility], Therapist, sports.

## 2019-04-17 NOTE — Progress Notes (Signed)
Pt left per EMS in stable condition. Pt being transferred to [facility] r/t + Covid test. Report update called to [place] at [facility]

## 2019-04-17 NOTE — Consult Note (Signed)
[place] Nurse wound consult note Consultation was completed by review of records, images and assistance from the bedside nurse/clinical staff due to COVID-19 positive status.  Reason for Consult:knee wound Reviewed chart; [place] nurse has consulted on this patient last week for same wound. I have updated the orders.   Re consult if needed, will not follow at this time. Thanks  Bralynn Donado [employer], RN,CWOCN, CNS, [place] [phone])

## 2019-04-18 LAB — GLUCOSE, CAPILLARY
Glucose-Capillary: 54 mg/dL — ABNORMAL LOW (ref 70–99)
Glucose-Capillary: 78 mg/dL (ref 70–99)
Glucose-Capillary: 112 mg/dL — ABNORMAL HIGH (ref 70–99)
Glucose-Capillary: 303 mg/dL — ABNORMAL HIGH (ref 70–99)
Glucose-Capillary: 402 mg/dL — ABNORMAL HIGH (ref 70–99)
Glucose-Capillary: 244 mg/dL — ABNORMAL HIGH (ref 70–99)
Glucose-Capillary: 245 mg/dL — ABNORMAL HIGH (ref 70–99)
Glucose-Capillary: 181 mg/dL — ABNORMAL HIGH (ref 70–99)

## 2019-04-18 LAB — CBC WITH DIFFERENTIAL/PLATELET
Abs Immature Granulocytes: 0.03 10*3/uL (ref 0.00–0.07)
Basophils Absolute: 0 10*3/uL (ref 0.0–0.1)
Basophils Relative: 0 %
Eosinophils Absolute: 0 10*3/uL (ref 0.0–0.5)
Eosinophils Relative: 0 %
HCT: 27.1 % — ABNORMAL LOW (ref 39.0–52.0)
Hemoglobin: 8.8 g/dL — ABNORMAL LOW (ref 13.0–17.0)
Immature Granulocytes: 0 %
Lymphocytes Relative: 5 %
Lymphs Abs: 0.4 10*3/uL — ABNORMAL LOW (ref 0.7–4.0)
MCH: 30.4 pg (ref 26.0–34.0)
MCHC: 32.5 g/dL (ref 30.0–36.0)
MCV: 93.8 fL (ref 80.0–100.0)
Monocytes Absolute: 0.3 10*3/uL (ref 0.1–1.0)
Monocytes Relative: 3 %
Neutro Abs: 7.7 10*3/uL (ref 1.7–7.7)
Neutrophils Relative %: 92 %
Platelets: 395 10*3/uL (ref 150–400)
RBC: 2.89 MIL/uL — ABNORMAL LOW (ref 4.22–5.81)
RDW: 13.8 % (ref 11.5–15.5)
WBC: 8.5 10*3/uL (ref 4.0–10.5)
nRBC: 0 % (ref 0.0–0.2)

## 2019-04-18 LAB — COMPREHENSIVE METABOLIC PANEL
ALT: 19 U/L (ref 0–44)
AST: 20 U/L (ref 15–41)
Albumin: 2.7 g/dL — ABNORMAL LOW (ref 3.5–5.0)
Alkaline Phosphatase: 77 U/L (ref 38–126)
Anion gap: 10 (ref 5–15)
BUN: 28 mg/dL — ABNORMAL HIGH (ref 6–20)
CO2: 28 mmol/L (ref 22–32)
Calcium: 7.9 mg/dL — ABNORMAL LOW (ref 8.9–10.3)
Chloride: 103 mmol/L (ref 98–111)
Creatinine, Ser: 1.45 mg/dL — ABNORMAL HIGH (ref 0.61–1.24)
GFR calc Af Amer: >60 mL/min (ref >60)
GFR calc non Af Amer: 58 mL/min — ABNORMAL LOW (ref >60)
Glucose, Bld: 55 mg/dL — ABNORMAL LOW (ref 70–99)
Potassium: 3.2 mmol/L — ABNORMAL LOW (ref 3.5–5.1)
Sodium: 141 mmol/L (ref 135–145)
Total Bilirubin: 0.2 mg/dL — ABNORMAL LOW (ref 0.3–1.2)
Total Protein: 6.2 g/dL — ABNORMAL LOW (ref 6.5–8.1)

## 2019-04-18 LAB — PROCALCITONIN: Procalcitonin: <0.1 ng/mL

## 2019-04-18 LAB — D-DIMER, QUANTITATIVE: D-Dimer, Quant: 1.54 ug/mL-FEU — ABNORMAL HIGH (ref 0.00–0.50)

## 2019-04-18 LAB — MAGNESIUM: Magnesium: 1.5 mg/dL — ABNORMAL LOW (ref 1.7–2.4)

## 2019-04-18 LAB — C-REACTIVE PROTEIN: CRP: 1.7 mg/dL — ABNORMAL HIGH (ref <1.0)

## 2019-04-18 LAB — FERRITIN: Ferritin: 434 ng/mL — ABNORMAL HIGH (ref 24–336)

## 2019-04-18 LAB — LIPASE, BLOOD: Lipase: 112 U/L — ABNORMAL HIGH (ref 11–51)

## 2019-04-18 MED ORDER — SODIUM CHLORIDE 0.9 % IV SOLN
INTRAVENOUS | Status: AC
  Administered 2019-04-18: 10:00:00 via INTRAVENOUS

## 2019-04-18 MED ORDER — INSULIN ASPART 100 UNIT/ML ~~LOC~~ SOLN: 0.0000 [IU] | Freq: Three times a day (TID) | SUBCUTANEOUS | Status: DC

## 2019-04-18 MED ORDER — INSULIN ASPART PROT & ASPART (70-30 MIX) 100 UNIT/ML ~~LOC~~ SUSP
20.0000 [IU] | Freq: Two times a day (BID) | SUBCUTANEOUS | Status: DC
  Administered 2019-04-18 – 2019-04-19 (×3): 20 [IU] via SUBCUTANEOUS
  Filled 2019-04-18: qty 10

## 2019-04-18 MED ORDER — POTASSIUM CHLORIDE CRYS ER 20 MEQ PO TBCR
20.0000 meq | EXTENDED_RELEASE_TABLET | Freq: Once | ORAL | Status: AC
  Administered 2019-04-18: 20 meq via ORAL
  Filled 2019-04-18: qty 1

## 2019-04-18 MED ORDER — INSULIN ASPART 100 UNIT/ML ~~LOC~~ SOLN
0.0000 [IU] | Freq: Three times a day (TID) | SUBCUTANEOUS | Status: DC
  Administered 2019-04-18: 11 [IU] via SUBCUTANEOUS
  Administered 2019-04-18 – 2019-04-19 (×2): 5 [IU] via SUBCUTANEOUS
  Administered 2019-04-19: 13:00:00 15 [IU] via SUBCUTANEOUS

## 2019-04-18 MED ORDER — INSULIN ASPART 100 UNIT/ML ~~LOC~~ SOLN: 0.0000 [IU] | Freq: Every day | SUBCUTANEOUS | Status: DC

## 2019-04-18 NOTE — Progress Notes (Signed)
Inpatient Diabetes Program Recommendations  AACE/ADA: New Consensus Statement on Inpatient Glycemic Control (2015)  Target Ranges:  Prepandial:   less than 140 mg/dL      Peak postprandial:   less than 180 mg/dL (1-2 hours)      Critically ill patients:  140 - 180 mg/dL   Lab Results  Component Value Date   GLUCAP 112 (H) 04/18/2019   HGBA1C 8.1 (H) 03/30/2019    Review of Glycemic Control Results for LANDO, ALCALDE (MRN [id number]) as of 04/18/2019 11:36  Ref. Range 04/17/2019 15:44 04/17/2019 21:29 04/18/2019 07:45 04/18/2019 09:28 04/18/2019 10:03  Glucose-Capillary Latest Ref Range: 70 - 99 mg/dL 257 (H) 251 (H) 54 (L) 78 112 (H)   Diabetes history: DM Outpatient Diabetes medications: 70/30 25 units qam, 35 units qpm  Current orders for Inpatient glycemic control: Novolog 70/30 20 bid + Novolog 0-5 hs correction  Inpatient Diabetes Program Recommendations:   Fasting CBG 54. Please consider: -Decrease in 70/30 insulin to 16 units bid -Novolog sensitive correction tid along with the hs 0-5 units Secure chat sent to Dr. Candiss Norse.  Thank you, [name]. Aaryana Betke, RN, MSN, CDE  Diabetes Coordinator Inpatient Glycemic Control Team Team Pager [phone] (8am-5pm) 04/18/2019 11:41 AM

## 2019-04-18 NOTE — Evaluation (Addendum)
Occupational Therapy Evaluation Patient Details Name: Gregory Moody MRN: [id number] DOB: [birth year] Today's Date: 04/18/2019    History of Present Illness 43 yo male presenting to [facility] ED with complaints of nausea, vomiting, and generalized weakness. Tested COVID positive. PMH including DM type ll with gastroparesis.    Clinical Impression   PTA, pt was living with his sister but reports she is only home on the weekends. Pt reporting he performed BADLs and used RW for functional mobility. Pt presenting with poor activity tolerance and having syncopal episodes during bed mobility. BP as follows: sitting EOB 96/83, supine 113/77, after pivot to recliner 72/46, and in recliner with legs elevated 81/51. Pt's SpO2 >90% on RA and HR stable. Due to poor activity tolerance, pt requiring Max A for LB ADLs and Max A +2 for functional transfers. Pt will require further acute OT to facilitate safe dc. Pending BP stability, pt may need post-acute rehab due further OT to increase independence and safety with ADLs and functional mobility. However, if progresses well, may be able to dc home with HHOT.   [employer] Interpreter usedRegino Schultze ID #[id number]      Follow Up Recommendations  Home health OT;SNF;Other (comment)(TBD. Pending stablizing BP and further assessment of mobilit)    Equipment Recommendations  3 in 1 bedside commode    Recommendations for Other Services PT consult     Precautions / Restrictions Precautions Precautions: Other (comment);Fall(hypotensive) Restrictions Weight Bearing Restrictions: No      Mobility Bed Mobility                  Transfers Overall transfer level: Needs assistance Equipment used: 2 person hand held assist Transfers: Sit to/from Stand;Stand Pivot Transfers Sit to Stand: Max assist;+2 physical assistance;+2 safety/equipment Stand pivot transfers: Max assist;+2 physical assistance;+2 safety/equipment       General transfer comment: Max A to  power up and then pivot to recliner. +2 for safety as pt is hypotensive    Balance Overall balance assessment: Needs assistance Sitting-balance support: No upper extremity supported;Feet supported Sitting balance-Leahy Scale: Poor Sitting balance - Comments: Pt sitting at EOB and then requiring support due to hypotension   Standing balance support: Bilateral upper extremity supported;During functional activity Standing balance-Leahy Scale: Zero Standing balance comment: Reliant on physical A                           ADL either performed or assessed with clinical judgement   ADL Overall ADL's : Needs assistance/impaired Eating/Feeding: Independent;Sitting   Grooming: Set up;Supervision/safety;Sitting   Upper Body Bathing: Minimal assistance;Sitting   Lower Body Bathing: Maximal assistance;Sit to/from stand   Upper Body Dressing : Minimal assistance;Sitting   Lower Body Dressing: Maximal assistance;Sit to/from stand   Toilet Transfer: Maximal assistance;+2 for physical assistance;+2 for safety/equipment;Stand-pivot(simulated to recliner)           Functional mobility during ADLs: Maximal assistance;+2 for physical assistance;+2 for safety/equipment General ADL Comments: Pt requiring increased assistance due to syncopal episodes and hypotension.     Vision         Perception     Praxis      Pertinent Vitals/Pain Pain Assessment: No/denies pain     Hand Dominance Right   Extremity/Trunk Assessment Upper Extremity Assessment Upper Extremity Assessment: Generalized weakness   Lower Extremity Assessment Lower Extremity Assessment: Generalized weakness   Cervical / Trunk Assessment Cervical / Trunk Assessment: Other exceptions Cervical / Trunk Exceptions: Poor trunk strength  and control   Communication Communication Communication: Prefers language other than English(Spanish)   Cognition Arousal/Alertness: Awake/alert Behavior During Therapy: Flat  affect Overall Cognitive Status: Impaired/Different from baseline Area of Impairment: Problem solving                             Problem Solving: Slow processing General Comments: Pt requiring increased time for processing. Difficult to fully assess with language barrier and hypotensive episodes   General Comments  SpO2 >90% on RA. HR stable. BP: sitting EOB 96/83, supine 113/77, after pivot to recliner 72/46, and in recliner with legs elevated 81/51    Exercises     Shoulder Instructions      Home Living Family/patient expects to be discharged to:: Private residence Living Arrangements: Other relatives(Sister) Available Help at Discharge: Family;Available PRN/intermittently(mainly home on weekens) Type of Home: Mobile home Home Access: Stairs to enter Entrance Stairs-Number of Steps: 3-4 Entrance Stairs-Rails: Right;Left Home Layout: One level     Bathroom Shower/Tub: Occupational psychologist: Standard     Home Equipment: Cane - single point;Crutches          Prior Functioning/Environment Level of Independence: Independent with assistive device(s)        Comments: Pt reports he is uses RW for functional mobility. BADLs independently per pt        OT Problem List: Decreased strength;Decreased range of motion;Decreased activity tolerance;Impaired balance (sitting and/or standing);Decreased knowledge of use of DME or AE;Decreased knowledge of precautions      OT Treatment/Interventions: Self-care/ADL training;Therapeutic exercise;Energy conservation;DME and/or AE instruction;Therapeutic activities;Patient/family education    OT Goals(Current goals can be found in the care plan section) Acute Rehab OT Goals Patient Stated Goal: "Feel better" OT Goal Formulation: With patient Time For Goal Achievement: 05/02/19 Potential to Achieve Goals: Good  OT Frequency: Min 3X/week   Barriers to D/C:            Co-evaluation PT/OT/SLP  Co-Evaluation/Treatment: Yes Reason for Co-Treatment: Complexity of the patient's impairments (multi-system involvement);To address functional/ADL transfers;For patient/therapist safety   OT goals addressed during session: ADL's and self-care      AM-PAC OT "6 Clicks" Daily Activity     Outcome Measure Help from another person eating meals?: None Help from another person taking care of personal grooming?: A Little Help from another person toileting, which includes using toliet, bedpan, or urinal?: A Lot Help from another person bathing (including washing, rinsing, drying)?: A Lot Help from another person to put on and taking off regular upper body clothing?: A Lot Help from another person to put on and taking off regular lower body clothing?: A Lot 6 Click Score: 15   End of Session Nurse Communication: Mobility status;Other (comment)(Syncopal episodes)  Activity Tolerance: Patient tolerated treatment well Patient left: in chair;with call bell/phone within reach;with chair alarm set  OT Visit Diagnosis: Unsteadiness on feet (R26.81);Other abnormalities of gait and mobility (R26.89);Muscle weakness (generalized) (M62.81)                Time: 3419-3790 OT Time Calculation (min): 38 min Charges:  OT General Charges $OT Visit: 1 Visit OT Evaluation $OT Eval Moderate Complexity: 1 Mod OT Treatments $Self Care/Home Management : 8-22 mins  Melquisedec Journey MSOT, OTR/L Acute Rehab Pager: [phone] Office: [place] 04/18/2019, 11:14 AM

## 2019-04-18 NOTE — Evaluation (Signed)
Physical Therapy Evaluation Patient Details Name: Gregory Moody MRN: [id number] DOB: 11-29-1975 Today's Date: 04/18/2019   History of Present Illness  43 yo male presenting to [facility] ED with complaints of nausea, vomiting, and generalized weakness. Tested COVID positive. PMH including DM type ll with gastroparesis., CKD  Clinical Impression  The patient is profoundly weak and orthostatic when sitting up. Sat up  With dizziness, eyes deviating to the right and jerking of extremities and decreased responses. BP prior 113/77, dropped to 72/46. Patient alert again, assisted to sitting with similar responses of syncopy and jerking. Third attempt able to get to recliner with 2 max assist , no jerking  BP 81/51. RN aware. Patient reports  He has no assistance at home. Currently unable to ambulate due to orthostatic BP. Pt admitted with above diagnosis. Pt currently with functional limitations due to the deficits listed below (see PT Problem List).  Pt will benefit from skilled PT to increase their independence and safety with mobility to allow discharge to the venue listed below.           Follow Up Recommendations (TBD home if BP stable)    Equipment Recommendations  None recommended by PT    Recommendations for Other Services       Precautions / Restrictions Precautions Precautions: Other (comment) Precaution Comments: orthostatic Restrictions Weight Bearing Restrictions: No      Mobility  Bed Mobility Overal bed mobility: Needs Assistance Bed Mobility: Supine to Sit;Sit to Supine     Supine to sit: Mod assist Sit to supine: Mod assist;Max assist   General bed mobility comments: assist with trunk  2 times after sitting up patient appeared with syncopal spells and had to be lifted  back into bed.  Transfers Overall transfer level: Needs assistance Equipment used: 2 person hand held assist Transfers: Sit to/from [facility] Sit to Stand: Max assist;+2  physical assistance;+2 safety/equipment Stand pivot transfers: Max assist;+2 physical assistance;+2 safety/equipment       General transfer comment: Max A to power up and then pivot to recliner. +2 for safety as pt is hypotensive, so moved quickly.  Ambulation/Gait                Stairs            Wheelchair Mobility    Modified Rankin (Stroke Patients Only)       Balance Overall balance assessment: Needs assistance Sitting-balance support: No upper extremity supported;Feet supported Sitting balance-Leahy Scale: Poor Sitting balance - Comments: Pt sitting at EOB and then requiring support due to hypotension   Standing balance support: Bilateral upper extremity supported;During functional activity Standing balance-Leahy Scale: Zero Standing balance comment: Reliant on physical A                             Pertinent Vitals/Pain Pain Assessment: No/denies pain    Home Living Family/patient expects to be discharged to:: Private residence Living Arrangements: Other relatives(Sister) Available Help at Discharge: Family;Available PRN/intermittently(mainly home on weekens) Type of Home: Mobile home Home Access: Stairs to enter Entrance Stairs-Rails: Right;Left Entrance Stairs-Number of Steps: 3-4 Home Layout: One level Home Equipment: Walker - 2 wheels      Prior Function Level of Independence: Independent with assistive device(s)         Comments: Pt reports he is uses RW for functional mobility. BADLs independently per pt     Hand Dominance   Dominant Hand: Right  Extremity/Trunk Assessment   Upper Extremity Assessment Upper Extremity Assessment: Defer to OT evaluation    Lower Extremity Assessment Lower Extremity Assessment: Generalized weakness    Cervical / Trunk Assessment Cervical / Trunk Assessment: Other exceptions Cervical / Trunk Exceptions: Poor trunk strength and control, leans forward  Communication    Communication: Prefers language other than English(Spanish)  Cognition Arousal/Alertness: Awake/alert Behavior During Therapy: Flat affect Overall Cognitive Status: Impaired/Different from baseline Area of Impairment: Problem solving                             Problem Solving: Slow processing General Comments: Pt requiring increased time for processing. Difficult to fully assess with language barrier and hypotensive episodes      General Comments General comments (skin integrity, edema, etc.): SpO2 >90% on RA. HR stable. BP: sitting EOB 96/83, supine 113/77, after pivot to recliner 72/46, and in recliner with legs elevated 81/51    Exercises     Assessment/Plan    PT Assessment Patient needs continued PT services  PT Problem List Decreased strength;Decreased mobility;Decreased safety awareness;Decreased knowledge of precautions;Decreased activity tolerance;Cardiopulmonary status limiting activity;Decreased balance;Decreased knowledge of use of DME       PT Treatment Interventions DME instruction;Therapeutic activities;Gait training;Functional mobility training;Therapeutic exercise;Patient/family education    PT Goals (Current goals can be found in the Care Plan section)  Acute Rehab PT Goals Patient Stated Goal: "Feel better" PT Goal Formulation: With patient Time For Goal Achievement: 05/02/19 Potential to Achieve Goals: Fair    Frequency Min 3X/week   Barriers to discharge Decreased caregiver support      Co-evaluation PT/OT/SLP Co-Evaluation/Treatment: Yes Reason for Co-Treatment: For patient/therapist safety PT goals addressed during session: Mobility/safety with mobility OT goals addressed during session: ADL's and self-care       AM-PAC PT "6 Clicks" Mobility  Outcome Measure Help needed turning from your back to your side while in a flat bed without using bedrails?: A Lot Help needed moving from lying on your back to sitting on the side of a flat  bed without using bedrails?: A Lot Help needed moving to and from a bed to a chair (including a wheelchair)?: Total Help needed standing up from a chair using your arms (e.g., wheelchair or bedside chair)?: Total Help needed to walk in hospital room?: Total Help needed climbing 3-5 steps with a railing? : Total 6 Click Score: 8    End of Session Equipment Utilized During Treatment: Gait belt Activity Tolerance: Treatment limited secondary to medical complications (Comment);Patient limited by lethargy Patient left: in chair;with call bell/phone within reach;with chair alarm set Nurse Communication: Mobility status(orthostatic) PT Visit Diagnosis: Difficulty in walking, not elsewhere classified (R26.2);Dizziness and giddiness (R42)    Time: 6945-0388 PT Time Calculation (min) (ACUTE ONLY): 39 min   Charges:   PT Evaluation $PT Eval Moderate Complexity: 1 Mod PT Treatments $Therapeutic Activity: 8-22 mins        Tresa Endo PT Acute Rehabilitation Services [phone]  Office [phone]   Claretha Cooper 04/18/2019, 11:39 AM

## 2019-04-18 NOTE — Progress Notes (Signed)
PROGRESS NOTE                                                                                                                                                                                                             Patient Demographics:    Gregory Moody, is a 43 y.o. male, DOB - 1976/09/14, UGQ:[id number]  Outpatient Primary MD for the patient is [place]    LOS - 4  Admit date - 04/14/2019    CC - N&V     Brief Narrative  43 year old gentleman with prior history of insulin-dependent diabetes mellitus, diabetic gastro paresis presents to ED with nausea vomiting and generalized weakness for 2 days.  On arrival patient was found to be hypotensive.  He was also tested positive for coronavirus at [facility] and found to be in acute on stage III CKD, he was admitted to [facility] where he was diagnosed with staph hominis bacteremia possible contamination, he was stabilized and then transferred to my care on 04/18/2019 on day 4 of his hospital stay.   Subjective:    Gregory Moody today has, No headache, No chest pain, No abdominal pain - No Nausea, No new weakness tingling or numbness, No Cough - SOB.     Assessment  & Plan :     1. Acute Covid 19 Viral Pneumonitis during the ongoing 2020 Covid 19 Pandemic with mild gastroenteritis- no hypoxia or shortness of breath, remained stable on room air, continue to monitor with supportive care.  Stable inflammatory markers.  Gastroenteritis symptoms have improved  COVID-19 Labs  Recent Labs    04/16/19 0751 04/17/19 0655  DDIMER 1.64* 1.54*  FERRITIN 529* 459*  CRP 4.8* 2.9*    Lab Results  Component Value Date   SARSCOV2NAA POSITIVE (A) 04/13/2019   SARSCOV2NAA NOT DETECTED 03/30/2019   SARSCOV2NAA NEGATIVE 03/17/2019   SARSCOV2NAA NEGATIVE 03/07/2019     Hepatic Function Latest Ref Rng & Units 04/17/2019 04/16/2019 04/15/2019  Total  Protein 6.5 - 8.1 g/dL 6.1(L) 6.6 6.6  Albumin 3.5 - 5.0 g/dL 2.6(L) 2.7(L) 2.8(L)  AST 15 - 41 U/L '23 28 23  ' ALT 0 - 44 U/L 22 28 33  Alk Phosphatase 38 - 126 U/L 75 82 85  Total Bilirubin 0.3 - 1.2 mg/dL 0.6 0.6 0.6  Bilirubin, Direct 0.0 - 0.2  mg/dL - - -        Component Value Date/Time   BNP 261.5 (H) 04/14/2019 1840      2.  Mild acute pancreatitis much improved.  Advance diet, monitor lipase.  Lipase     Component Value Date/Time   LIPASE 86 (H) 04/17/2019 0655     3.  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  Improving with hydration.  Continue to monitor.  Good urine output, no signs of uremia.  4.  Anemia of chronic disease.  Stable.  5.  Large left knee abrasion.  Local wound care.  No signs of active infection.  6.  1 set positive for staph hominis coag negative bacteremia at [facility] 4 days ago.  On Rocephin repeat cultures negative, likely contamination discussed with Dr. Michel Bickers, ID, monitor procalcitonin and clinically.     7.  DM type II.  Currently hypoglycemic.  Adjusted insulin dose will monitor.Poor outpatient control due to hyperglycemia.   Lab Results  Component Value Date   HGBA1C 8.1 (H) 03/30/2019   CBG (last 3)  Recent Labs    04/17/19 2129 04/18/19 0745 04/18/19 0928  GLUCAP 251* 54* 78     Condition - Fair  Family Communication  :  None  Code Status :  Full  Diet   Diet Order            DIET SOFT Room service appropriate? Yes; Fluid consistency: Thin  Diet effective now               Disposition Plan  :  Home 1-2 days  Consults  :  ID over phone - 04/18/19 Dr Megan Salon  Procedures  :    CT abdomen pelvis.  Renal ultrasound.  Nonacute.   PUD Prophylaxis : PPI  DVT Prophylaxis  :   Heparin   Lab Results  Component Value Date   PLT 395 04/18/2019    Inpatient Medications  Scheduled Meds:  albuterol  2 puff Inhalation Q6H   aspirin EC  81 mg Oral Daily   heparin  5,000 Units Subcutaneous Q8H    insulin aspart  0-5 Units Subcutaneous QHS   insulin aspart protamine- aspart  20 Units Subcutaneous Q supper   methylPREDNISolone (SOLU-MEDROL) injection  30 mg Intravenous Q12H   multivitamin with minerals  1 tablet Oral Daily   mupirocin cream   Topical Daily   pantoprazole  40 mg Oral BID AC   vitamin C  500 mg Oral Daily   zinc sulfate  220 mg Oral Daily   Continuous Infusions:  sodium chloride     cefTRIAXone (ROCEPHIN)  IV 2 g (04/17/19 1201)   PRN Meds:.acetaminophen, calcium carbonate, chlorpheniramine-HYDROcodone, guaiFENesin-dextromethorphan, ondansetron **OR** ondansetron (ZOFRAN) IV, oxyCODONE, polyethylene glycol, zolpidem  Antibiotics  :    Anti-infectives (From admission, onward)   Start     Dose/Rate Route Frequency Ordered Stop   04/16/19 1315  cefTRIAXone (ROCEPHIN) 2 g in sodium chloride 0.9 % 100 mL IVPB     2 g 200 mL/hr over 30 Minutes Intravenous Every 24 hours 04/16/19 1313     04/15/19 1800  cefTRIAXone (ROCEPHIN) 2 g in sodium chloride 0.9 % 100 mL IVPB  Status:  Discontinued     2 g 200 mL/hr over 30 Minutes Intravenous Every 24 hours 04/14/19 1924 04/14/19 1925   04/15/19 1200  cefTRIAXone (ROCEPHIN) 2 g in sodium chloride 0.9 % 100 mL IVPB  Status:  Discontinued     2  g 200 mL/hr over 30 Minutes Intravenous Every 24 hours 04/14/19 1946 04/16/19 1150   04/14/19 1945  cefTRIAXone (ROCEPHIN) 1 g in sodium chloride 0.9 % 100 mL IVPB  Status:  Discontinued     1 g 200 mL/hr over 30 Minutes Intravenous  Once 04/14/19 1945 04/16/19 1313   04/14/19 1800  cefTRIAXone (ROCEPHIN) 1 g in sodium chloride 0.9 % 100 mL IVPB  Status:  Discontinued     1 g 200 mL/hr over 30 Minutes Intravenous Every 24 hours 04/14/19 1714 04/14/19 1924   04/14/19 1800  azithromycin (ZITHROMAX) 500 mg in sodium chloride 0.9 % 250 mL IVPB  Status:  Discontinued     500 mg 250 mL/hr over 60 Minutes Intravenous Every 24 hours 04/14/19 1714 04/16/19 1150       Time Spent in  minutes  30   Lala Lund M.D on 04/18/2019 at 9:36 AM  To page go to [email or web address] - password [email or web address]  [facility] -  Office  [phone]   See all Orders from today for further details    Objective:   Vitals:   04/17/19 1903 04/17/19 2124 04/18/19 0400 04/18/19 0747  BP: (!) 137/96 (!) 139/94 120/87   Pulse: 78  69   Resp:   13   Temp: 98 F (36.7 C) 97.9 F (36.6 C) 97.8 F (36.6 C) 98.4 F (36.9 C)  TempSrc: Oral Oral Oral Oral  SpO2: 100%  99%   Weight:  58.6 kg    Height:  '5\' 8"'  (1.727 m)      Wt Readings from Last 3 Encounters:  04/17/19 58.6 kg  04/13/19 59 kg  04/01/19 59 kg     Intake/Output Summary (Last 24 hours) at 04/18/2019 0936 Last data filed at 04/18/2019 0749 Gross per 24 hour  Intake --  Output 1250 ml  Net -1250 ml     Physical Exam  Awake Alert , No new F.N deficits, Normal affect [place].AT,PERRAL Supple Neck,No JVD, No cervical lymphadenopathy appriciated.  Symmetrical Chest wall movement, Good air movement bilaterally, CTAB RRR,No Gallops,Rubs or new Murmurs, No Parasternal Heave +ve B.Sounds, Abd Soft, No tenderness, No organomegaly appriciated, No rebound - guarding or rigidity. No Cyanosis, Clubbing or edema, No new Rash or bruise      Data Review:    CBC Recent Labs  Lab 04/14/19 1511 04/15/19 0747 04/16/19 0751 04/17/19 0655 04/18/19 0800  WBC 3.8* 3.8* 7.9 7.0 8.5  HGB 8.4*   8.2* 8.4* 8.9* 8.8* 8.8*  HCT 26.1* 24.6* 26.2* 26.0* 27.1*  PLT 250 304 323 335 395  MCV 95.3 90.4 89.7 92.2 93.8  MCH 30.7 30.9 30.5 31.2 30.4  MCHC 32.2 34.1 34.0 33.8 32.5  RDW 14.0 13.8 13.9 14.2 13.8  LYMPHSABS 1.2 0.4* 0.4* 0.3* 0.4*  MONOABS 0.3 0.1 0.2 0.2 0.3  EOSABS 0.0 0.0 0.0 0.0 0.0  BASOSABS 0.0 0.0 0.0 0.0 0.0    Chemistries  Recent Labs  Lab 04/13/19 2138  04/14/19 1511 04/14/19 2346 04/15/19 0747 04/16/19 0751 04/17/19 0655  NA 134*   < > 138 133* 138 145 142  K 4.3   < > 4.1 4.5 3.7 3.3* 3.4*  CL 105   < >  114* 108 107 105 104  CO2 11*   < > 9* 9* 16* 25 27  GLUCOSE 347*   < > 220* 553* 198* 155* 42*  BUN 74*   < > 62* 62* 55* 47* 33*  CREATININE 7.75*   < >  6.04* 5.55* 4.45* 2.86* 1.92*  CALCIUM 8.0*   < > 8.0* 8.0* 8.2* 8.0* 8.1*  MG  --   --   --   --  1.9 1.6* 1.8  AST 43*  --  28  --  '23 28 23  ' ALT 51*  --  41  --  33 28 22  ALKPHOS 89  --  82  --  85 82 75  BILITOT 0.7  --  0.7  --  0.6 0.6 0.6   < > = values in this interval not displayed.   ------------------------------------------------------------------------------------------------------------------ Recent Labs    04/16/19 0751 04/17/19 0655  TRIG 121 83    Lab Results  Component Value Date   HGBA1C 8.1 (H) 03/30/2019   ------------------------------------------------------------------------------------------------------------------ No results for input(s): TSH, T4TOTAL, T3FREE, THYROIDAB in the last 72 hours.  Invalid input(s): FREET3  Cardiac Enzymes No results for input(s): CKMB, TROPONINI, MYOGLOBIN in the last 168 hours.  Invalid input(s): CK ------------------------------------------------------------------------------------------------------------------    Component Value Date/Time   BNP 261.5 (H) 04/14/2019 1840    Micro Results Recent Results (from the past 240 hour(s))  SARS Coronavirus 2 (CEPHEID- Performed in [facility] hospital lab), Hosp Order     Status: Abnormal   Collection Time: 04/13/19 10:02 PM   Specimen: Nasopharyngeal Swab  Result Value Ref Range Status   SARS Coronavirus 2 POSITIVE (A) NEGATIVE Final    Comment: RESULT CALLED TO, READ BACK BY AND VERIFIED WITH: RACHEL HAYDEN ON 04/13/2019 AT 2256 QSD (NOTE) If result is NEGATIVE SARS-CoV-2 target nucleic acids are NOT DETECTED. The SARS-CoV-2 RNA is generally detectable in upper and lower  respiratory specimens during the acute phase of infection. The lowest  concentration of SARS-CoV-2 viral copies this assay can detect is 250    copies / mL. A negative result does not preclude SARS-CoV-2 infection  and should not be used as the sole basis for treatment or other  patient management decisions.  A negative result may occur with  improper specimen collection / handling, submission of specimen other  than nasopharyngeal swab, presence of viral mutation(s) within the  areas targeted by this assay, and inadequate number of viral copies  (<250 copies / mL). A negative result must be combined with clinical  observations, patient history, and epidemiological information. If result is POSITIVE SARS-CoV-2 target nucleic acids are DETECTED.  The SARS-CoV-2 RNA is generally detectable in upper and lower  respiratory specimens during the acute phase of infection.  Positive  results are indicative of active infection with SARS-CoV-2.  Clinical  correlation with patient history and other diagnostic information is  necessary to determine patient infection status.  Positive results do  not rule out bacterial infection or co-infection with other viruses. If result is PRESUMPTIVE POSTIVE SARS-CoV-2 nucleic acids MAY BE PRESENT.   A presumptive positive result was obtained on the submitted specimen  and confirmed on repeat testing.  While 2019 novel coronavirus  (SARS-CoV-2) nucleic acids may be present in the submitted sample  additional confirmatory testing may be necessary for epidemiological  and / or clinical management purposes  to differentiate between  SARS-CoV-2 and other Sarbecovirus currently known to infect humans.  If clinically indicated additional testing with an alternate test  methodology [phone])  is advised. The SARS-CoV-2 RNA is generally  detectable in upper and lower respiratory specimens during the acute  phase of infection. The expected result is Negative. Fact Sheet for Patients:  StrictlyIdeas.no Fact Sheet for Healthcare  Providers: BankingDealers.co.za This  test is not yet approved or cleared by the [place] and has been authorized for detection and/or diagnosis of SARS-CoV-2 by FDA under an Emergency Use Authorization (EUA).  This EUA will remain in effect (meaning this test can be used) for the duration of the COVID-19 declaration under Section 564(b)(1) of the Act, 21 U.S.C. section 360bbb-3(b)(1), unless the authorization is terminated or revoked sooner. Performed at [facility], [address] [place], [place] [address]   Culture, blood (routine x 2)     Status: Abnormal   Collection Time: 04/13/19 11:15 PM   Specimen: BLOOD  Result Value Ref Range Status   Specimen Description   Final    BLOOD LEFT ANTECUBITAL Performed at [facility], [address], [place], [place] [address]    Special Requests   Final    BOTTLES DRAWN AEROBIC AND ANAEROBIC Blood Culture adequate volume Performed at [facility], [address] [place], [place] [address]    Culture  Setup Time   Final    GRAM POSITIVE COCCI AEROBIC BOTTLE ONLY CRITICAL RESULT CALLED TO, READ BACK BY AND VERIFIED WITH: BRANDY DAVIS AT 1707 ON 04/14/2019 [place]. ALSO CALLED TO KAREN PATTON PHARMD AT [facility] AT 8185 ON 04/14/2019 [place]. [place] Performed at [facility], [place] [address] [place], [place] [address]    Culture STAPHYLOCOCCUS HOMINIS (A)  Final   Report Status 04/16/2019 FINAL  Final   Organism ID, Bacteria STAPHYLOCOCCUS HOMINIS  Final      Susceptibility   Staphylococcus hominis - MIC*    CIPROFLOXACIN <=0.5 SENSITIVE Sensitive     ERYTHROMYCIN >=8 RESISTANT Resistant     GENTAMICIN <=0.5 SENSITIVE Sensitive     OXACILLIN <=0.25 SENSITIVE Sensitive     TETRACYCLINE <=1 SENSITIVE Sensitive     VANCOMYCIN 1 SENSITIVE Sensitive     TRIMETH/SULFA <=10 SENSITIVE Sensitive     CLINDAMYCIN <=0.25 SENSITIVE Sensitive     RIFAMPIN  <=0.5 SENSITIVE Sensitive     Inducible Clindamycin NEGATIVE Sensitive     * STAPHYLOCOCCUS HOMINIS  Culture, blood (routine x 2)     Status: Abnormal   Collection Time: 04/13/19 11:15 PM   Specimen: BLOOD  Result Value Ref Range Status   Specimen Description   Final    BLOOD LEFT FOREARM Performed at [facility], [address] [place], [place] [address]    Special Requests   Final    BOTTLES DRAWN AEROBIC AND ANAEROBIC Blood Culture adequate volume Performed at [facility], [address] [place], [place] [address]    Culture  Setup Time   Final    GRAM POSITIVE COCCI AEROBIC BOTTLE ONLY CRITICAL RESULT CALLED TO, READ BACK BY AND VERIFIED WITH: BRANDY DAVIS AT 1707 ON 04/14/2019 [place]. ALSO CALLED TO KAREN PATTON PHARMD AT [facility] AT 5027 ON 04/14/2019 [place]. GRAM STAIN REVIEWED-AGREE WITH RESULT T. TYSOR    Culture (A)  Final    STAPHYLOCOCCUS HOMINIS SUSCEPTIBILITIES PERFORMED ON PREVIOUS CULTURE WITHIN THE LAST 5 DAYS. Performed at [facility], [place] [address], [place], [place] [address]    Report Status 04/16/2019 FINAL  Final  Blood Culture ID Panel (Reflexed)     Status: Abnormal   Collection Time: 04/13/19 11:15 PM  Result Value Ref Range Status   Enterococcus species NOT DETECTED NOT DETECTED Final   Listeria monocytogenes NOT DETECTED NOT DETECTED Final   Staphylococcus species DETECTED (A) NOT DETECTED Final  Comment: Methicillin (oxacillin) susceptible coagulase negative staphylococcus. Possible blood culture contaminant (unless isolated from more than one blood culture draw or clinical case suggests pathogenicity). No antibiotic treatment is indicated for blood  culture contaminants. CRITICAL RESULT CALLED TO, READ BACK BY AND VERIFIED WITH: KAREN PATTON ON 04/14/19 AT 1711 [facility]    Staphylococcus aureus (BCID) NOT DETECTED NOT DETECTED Final   Methicillin resistance NOT DETECTED NOT DETECTED Final   Streptococcus species NOT DETECTED NOT  DETECTED Final   Streptococcus agalactiae NOT DETECTED NOT DETECTED Final   Streptococcus pneumoniae NOT DETECTED NOT DETECTED Final   Streptococcus pyogenes NOT DETECTED NOT DETECTED Final   Acinetobacter baumannii NOT DETECTED NOT DETECTED Final   Enterobacteriaceae species NOT DETECTED NOT DETECTED Final   Enterobacter cloacae complex NOT DETECTED NOT DETECTED Final   Escherichia coli NOT DETECTED NOT DETECTED Final   Klebsiella oxytoca NOT DETECTED NOT DETECTED Final   Klebsiella pneumoniae NOT DETECTED NOT DETECTED Final   Proteus species NOT DETECTED NOT DETECTED Final   Serratia marcescens NOT DETECTED NOT DETECTED Final   Haemophilus influenzae NOT DETECTED NOT DETECTED Final   Neisseria meningitidis NOT DETECTED NOT DETECTED Final   Pseudomonas aeruginosa NOT DETECTED NOT DETECTED Final   Candida albicans NOT DETECTED NOT DETECTED Final   Candida glabrata NOT DETECTED NOT DETECTED Final   Candida krusei NOT DETECTED NOT DETECTED Final   Candida parapsilosis NOT DETECTED NOT DETECTED Final   Candida tropicalis NOT DETECTED NOT DETECTED Final    Comment: Performed at [facility], [address] [place], [place] [address]  Culture, blood (Routine X 2) w Reflex to ID Panel     Status: None (Preliminary result)   Collection Time: 04/14/19  3:15 PM   Specimen: BLOOD  Result Value Ref Range Status   Specimen Description BLOOD BLOOD RIGHT FOREARM  Final   Special Requests   Final    BOTTLES DRAWN AEROBIC ONLY Blood Culture adequate volume   Culture   Final    NO GROWTH 3 DAYS Performed at [facility], [place] [address], [place], [place] [address]    Report Status PENDING  Incomplete  Culture, blood (Routine X 2) w Reflex to ID Panel     Status: None (Preliminary result)   Collection Time: 04/14/19  3:15 PM   Specimen: BLOOD RIGHT HAND  Result Value Ref Range Status   Specimen Description BLOOD RIGHT HAND  Final   Special Requests   Final    BOTTLES DRAWN  AEROBIC ONLY Blood Culture adequate volume   Culture   Final    NO GROWTH 3 DAYS Performed at [facility], [place] [address] [place], [place] [address]    Report Status PENDING  Incomplete  MRSA PCR Screening     Status: None   Collection Time: 04/14/19  7:07 PM   Specimen: Nasopharyngeal  Result Value Ref Range Status   MRSA by PCR NEGATIVE NEGATIVE Final    Comment:        The GeneXpert MRSA Assay (FDA approved for NASAL specimens only), is one component of a comprehensive MRSA colonization surveillance program. It is not intended to diagnose MRSA infection nor to guide or monitor treatment for MRSA infections. Performed at [facility], [place] [address] [place], [place] [address]   Aerobic Culture (superficial specimen)     Status: None (Preliminary result)   Collection Time: 04/17/19 10:08 AM   Specimen: KNEE; Wound  Result Value Ref Range Status  Specimen Description KNEE LOOK FOR MRSA  Final   Special Requests Immunocompromised  Final   Gram Stain   Final    NO WBC SEEN RARE GRAM POSITIVE COCCI IN PAIRS Performed at [facility], [place] [address] [place], [place] [address]    Culture PENDING  Incomplete   Report Status PENDING  Incomplete  Gastrointestinal Panel by PCR , Stool     Status: None   Collection Time: 04/17/19 10:29 AM   Specimen: Stool  Result Value Ref Range Status   Campylobacter species NOT DETECTED NOT DETECTED Final   Plesimonas shigelloides NOT DETECTED NOT DETECTED Final   Salmonella species NOT DETECTED NOT DETECTED Final   Yersinia enterocolitica NOT DETECTED NOT DETECTED Final   Vibrio species NOT DETECTED NOT DETECTED Final   Vibrio cholerae NOT DETECTED NOT DETECTED Final   Enteroaggregative E coli (EAEC) NOT DETECTED NOT DETECTED Final   Enteropathogenic E coli (EPEC) NOT DETECTED NOT DETECTED Final   Enterotoxigenic E coli (ETEC) NOT DETECTED NOT DETECTED Final   Shiga like toxin producing E coli (STEC) NOT DETECTED NOT  DETECTED Final   Shigella/Enteroinvasive E coli (EIEC) NOT DETECTED NOT DETECTED Final   Cryptosporidium NOT DETECTED NOT DETECTED Final   Cyclospora cayetanensis NOT DETECTED NOT DETECTED Final   Entamoeba histolytica NOT DETECTED NOT DETECTED Final   Giardia lamblia NOT DETECTED NOT DETECTED Final   Adenovirus F40/41 NOT DETECTED NOT DETECTED Final   Astrovirus NOT DETECTED NOT DETECTED Final   Norovirus GI/GII NOT DETECTED NOT DETECTED Final   Rotavirus A NOT DETECTED NOT DETECTED Final   Sapovirus (I, II, IV, and V) NOT DETECTED NOT DETECTED Final    Comment: Performed at [facility], [address], [place], [place] [address]    Radiology Reports Ct Abdomen Pelvis Wo Contrast  Result Date: 04/17/2019 CLINICAL DATA:  Abdominal pain, nausea, vomiting, and diarrhea. Acute renal failure. COVID-19 positive. History of diabetes. EXAM: CT ABDOMEN AND PELVIS WITHOUT CONTRAST TECHNIQUE: Multidetector CT imaging of the abdomen and pelvis was performed following the standard protocol without IV contrast. COMPARISON:  03/17/2019 FINDINGS: Lower chest: Mild bibasilar atelectasis and/or scarring. No pleural effusion. Normal heart size. Hepatobiliary: No focal liver abnormality is seen. There is new hyperdense material in an underdistended gallbladder which may reflect sludge. There are no regional inflammatory changes. No biliary dilatation is seen. Pancreas: Unremarkable. Spleen: Unremarkable. Adrenals/Urinary Tract: Unremarkable adrenal glands. No renal calculi or hydronephrosis. Decompressed bladder with a Foley catheter in place. Stomach/Bowel: The stomach is unremarkable. There is no evidence of bowel obstruction. Liquid stool is present in the rectum consistent with the history of a diarrheal illness. Mild sigmoid colon diverticulosis is noted without evidence of diverticulitis. The appendix is unremarkable. Vascular/Lymphatic: Normal caliber of the abdominal aorta. No enlarged lymph nodes.  Reproductive: Unremarkable prostate. Other: No intraperitoneal free fluid or pneumoperitoneum. Mild stranding and small foci of gas in the anterior abdominal wall likely reflective of subcutaneous medication injection. Musculoskeletal: No acute osseous abnormality or suspicious osseous lesion. Suspected healed pars defects bilaterally at L5 with unchanged grade 1 anterolisthesis of L5 on S1. Old fractures of the left ischium and proximal left femur. IMPRESSION: 1. No acute abnormality identified in the abdomen or pelvis. 2. Liquid stool in the rectum consistent with the history of a diarrheal illness. 3. Suspected gallbladder sludge. 4. Mild sigmoid colon diverticulosis without evidence of diverticulitis. Electronically Signed   By: Logan Bores M.D.   On: 04/17/2019 13:50   Dg  Chest 2 View  Result Date: 03/30/2019 CLINICAL DATA:  Hypothermia EXAM: CHEST - 2 VIEW COMPARISON:  03/17/2019, 06/20/2018, CT 02/13/2019 FINDINGS: Right upper lobe ovoid nodular opacity as before. No new ac airspace disease or effusion. Stable cardiomediastinal silhouette. No pneumothorax. IMPRESSION: No active cardiopulmonary disease. Stable right upper lobe nodular opacity. Electronically Signed   By: Donavan Foil M.D.   On: 03/30/2019 22:53   Dg Abd 1 View  Result Date: 04/14/2019 CLINICAL DATA:  History of hydronephrosis. EXAM: ABDOMEN - 1 VIEW COMPARISON:  CT abdomen pelvis 03/17/2019 FINDINGS: Paucity of small bowel gas. Gas within the stomach. Left lung bases clear. Osseous structures unremarkable. Supine evaluation limited for the detection of free intraperitoneal air. Chronic changes proximal left femur/greater trochanter. IMPRESSION: Paucity of small bowel gas without evidence for overt small bowel obstruction. Electronically Signed   By: Lovey Newcomer M.D.   On: 04/14/2019 17:46   US Renal  Result Date: 04/15/2019 CLINICAL DATA:  Acute kidney injury. EXAM: RENAL / URINARY TRACT ULTRASOUND COMPLETE COMPARISON:  CT abdomen  03/17/2019 FINDINGS: Right Kidney: Renal measurements: 12.4 x 4.9 x 6.2 cm = volume: 194 mL . Echogenicity within normal limits. No mass or hydronephrosis visualized. Left Kidney: Renal measurements: 11.5 x 5.1 x 5.5 cm = volume: 169 mL. Echogenicity within normal limits. No mass or hydronephrosis visualized. Bladder: Foley catheter in place. The bladder is distended with urine, suggesting the catheter is not functioning. Floor nurse notified of this finding at the time of the scan. IMPRESSION: Normal appearance of the kidneys. Normal size and echogenicity. No hydronephrosis. Foley catheter in the bladder, but the bladder is full of urine, suggesting the catheter is not functioning. Floor nurse notified at the time of the scan. Electronically Signed   By: Nelson Chimes M.D.   On: 04/15/2019 06:58   Dg Chest Port 1 View  Result Date: 04/16/2019 CLINICAL DATA:  43 year old male with pulmonary opacities presenting for follow-up. EXAM: PORTABLE CHEST 1 VIEW COMPARISON:  Earlier radiograph dated 04/15/2019 and chest radiograph dated 07/03 2 FINDINGS: Left lung base streaky densities appear slightly improved since the earlier radiograph of 04/15/2019. Stable area of opacity in the right upper lobe along the minor fissure again noted. No pleural effusion or pneumothorax. The cardiac silhouette is within normal limits. No acute osseous pathology. IMPRESSION: Overall slight interval improvement of bibasilar densities since the earlier radiograph. Electronically Signed   By: Anner Crete M.D.   On: 04/16/2019 14:16   Portable Chest 1 View  Result Date: 04/15/2019 CLINICAL DATA:  Shortness of breath.  COVID-19 positive. EXAM: PORTABLE CHEST 1 VIEW COMPARISON:  Chest x-ray dated April 13, 2019. FINDINGS: The heart size and mediastinal contours are within normal limits. Normal pulmonary vascularity. Mild patchy opacity at the left greater than right lung bases, more prominent when compared to prior study. No pleural  effusion or pneumothorax. Chronic masslike opacity in the peripheral right upper lobe is unchanged. IMPRESSION: 1. Mild bibasilar patchy opacities, likely reflecting COVID-19 pneumonia. Electronically Signed   By: Titus Dubin M.D.   On: 04/15/2019 09:38   Dg Chest Port 1 View  Result Date: 04/13/2019 CLINICAL DATA:  Chest discomfort.  History of tuberculosis. EXAM: PORTABLE CHEST 1 VIEW COMPARISON:  March 30, 2019 FINDINGS: The nodular density in the right upper lung is stable and chronic. There is mild volume loss in the right upper lobe. The heart and mediastinum is stable. There is some increased opacity in the region of the left perihilar region,  not seen previously. No other changes. IMPRESSION: Suspected developing subtle infiltrate in the left perihilar region. A PA and lateral chest x-ray may better evaluate. Alternatively, recommend attention on follow-up. No other interval change. Electronically Signed   By: Dorise Bullion III M.D   On: 04/13/2019 22:55   Dg Knee Complete 4 Views Left  Result Date: 03/31/2019 CLINICAL DATA:  Abscess. Open wound anterior left knee. EXAM: LEFT KNEE - COMPLETE 4+ VIEW COMPARISON:  Radiographs 2 weeks ago 03/17/2019 FINDINGS: No evidence of fracture, dislocation, or joint effusion. No bony destructive change or periosteal reaction. Soft tissue edema anteriorly with skin irregularity overlying the patella. No tracking soft tissue air. No radiopaque foreign body. IMPRESSION: 1. Soft tissue edema anterior to the patella. No radiopaque foreign body or osseous abnormality. 2. No radiographic evidence of osteomyelitis. Electronically Signed   By: Keith Rake M.D.   On: 03/31/2019 01:13

## 2019-04-19 ENCOUNTER — Other Ambulatory Visit: Payer: Self-pay

## 2019-04-19 LAB — CBC WITH DIFFERENTIAL/PLATELET
Abs Immature Granulocytes: 0.03 10*3/uL (ref 0.00–0.07)
Basophils Absolute: 0 10*3/uL (ref 0.0–0.1)
Basophils Relative: 0 %
Eosinophils Absolute: 0 10*3/uL (ref 0.0–0.5)
Eosinophils Relative: 0 %
HCT: 27.6 % — ABNORMAL LOW (ref 39.0–52.0)
Hemoglobin: 8.8 g/dL — ABNORMAL LOW (ref 13.0–17.0)
Immature Granulocytes: 0 %
Lymphocytes Relative: 4 %
Lymphs Abs: 0.4 10*3/uL — ABNORMAL LOW (ref 0.7–4.0)
MCH: 30.3 pg (ref 26.0–34.0)
MCHC: 31.9 g/dL (ref 30.0–36.0)
MCV: 95.2 fL (ref 80.0–100.0)
Monocytes Absolute: 0.3 10*3/uL (ref 0.1–1.0)
Monocytes Relative: 3 %
Neutro Abs: 8.9 10*3/uL — ABNORMAL HIGH (ref 1.7–7.7)
Neutrophils Relative %: 93 %
Platelets: 399 10*3/uL (ref 150–400)
RBC: 2.9 MIL/uL — ABNORMAL LOW (ref 4.22–5.81)
RDW: 13.9 % (ref 11.5–15.5)
WBC: 9.5 10*3/uL (ref 4.0–10.5)
nRBC: 0 % (ref 0.0–0.2)

## 2019-04-19 LAB — GLUCOSE, CAPILLARY
Glucose-Capillary: 112 mg/dL — ABNORMAL HIGH (ref 70–99)
Glucose-Capillary: 129 mg/dL — ABNORMAL HIGH (ref 70–99)
Glucose-Capillary: 19 mg/dL — CL (ref 70–99)
Glucose-Capillary: 213 mg/dL — ABNORMAL HIGH (ref 70–99)
Glucose-Capillary: 369 mg/dL — ABNORMAL HIGH (ref 70–99)
Glucose-Capillary: 53 mg/dL — ABNORMAL LOW (ref 70–99)
Glucose-Capillary: 83 mg/dL (ref 70–99)

## 2019-04-19 LAB — CULTURE, BLOOD (ROUTINE X 2)
Culture: NO GROWTH
Culture: NO GROWTH
Special Requests: ADEQUATE
Special Requests: ADEQUATE

## 2019-04-19 LAB — AEROBIC CULTURE W GRAM STAIN (SUPERFICIAL SPECIMEN): Gram Stain: NONE SEEN

## 2019-04-19 LAB — COMPREHENSIVE METABOLIC PANEL
ALT: 15 U/L (ref 0–44)
AST: 14 U/L — ABNORMAL LOW (ref 15–41)
Albumin: 2.7 g/dL — ABNORMAL LOW (ref 3.5–5.0)
Alkaline Phosphatase: 70 U/L (ref 38–126)
Anion gap: 11 (ref 5–15)
BUN: 27 mg/dL — ABNORMAL HIGH (ref 6–20)
CO2: 27 mmol/L (ref 22–32)
Calcium: 8 mg/dL — ABNORMAL LOW (ref 8.9–10.3)
Chloride: 102 mmol/L (ref 98–111)
Creatinine, Ser: 1.33 mg/dL — ABNORMAL HIGH (ref 0.61–1.24)
GFR calc Af Amer: >60 mL/min (ref >60)
GFR calc non Af Amer: >60 mL/min (ref >60)
Glucose, Bld: 191 mg/dL — ABNORMAL HIGH (ref 70–99)
Potassium: 3.2 mmol/L — ABNORMAL LOW (ref 3.5–5.1)
Sodium: 140 mmol/L (ref 135–145)
Total Bilirubin: 0.2 mg/dL — ABNORMAL LOW (ref 0.3–1.2)
Total Protein: 6.6 g/dL (ref 6.5–8.1)

## 2019-04-19 LAB — BASIC METABOLIC PANEL
Anion gap: 9 (ref 5–15)
BUN: 27 mg/dL — ABNORMAL HIGH (ref 6–20)
CO2: 28 mmol/L (ref 22–32)
Calcium: 7.9 mg/dL — ABNORMAL LOW (ref 8.9–10.3)
Chloride: 102 mmol/L (ref 98–111)
Creatinine, Ser: 1.34 mg/dL — ABNORMAL HIGH (ref 0.61–1.24)
GFR calc Af Amer: >60 mL/min (ref >60)
GFR calc non Af Amer: >60 mL/min (ref >60)
Glucose, Bld: 190 mg/dL — ABNORMAL HIGH (ref 70–99)
Potassium: 3.3 mmol/L — ABNORMAL LOW (ref 3.5–5.1)
Sodium: 139 mmol/L (ref 135–145)

## 2019-04-19 LAB — CBC
HCT: 24.6 % — ABNORMAL LOW (ref 39.0–52.0)
Hemoglobin: 7.9 g/dL — ABNORMAL LOW (ref 13.0–17.0)
MCH: 30.5 pg (ref 26.0–34.0)
MCHC: 32.1 g/dL (ref 30.0–36.0)
MCV: 95 fL (ref 80.0–100.0)
Platelets: 364 10*3/uL (ref 150–400)
RBC: 2.59 MIL/uL — ABNORMAL LOW (ref 4.22–5.81)
RDW: 14.1 % (ref 11.5–15.5)
WBC: 7 10*3/uL (ref 4.0–10.5)
nRBC: 0 % (ref 0.0–0.2)

## 2019-04-19 LAB — FERRITIN: Ferritin: 406 ng/mL — ABNORMAL HIGH (ref 24–336)

## 2019-04-19 LAB — D-DIMER, QUANTITATIVE: D-Dimer, Quant: 1.68 ug/mL-FEU — ABNORMAL HIGH (ref 0.00–0.50)

## 2019-04-19 LAB — LACTATE DEHYDROGENASE: LDH: 217 U/L — ABNORMAL HIGH (ref 98–192)

## 2019-04-19 LAB — PROCALCITONIN: Procalcitonin: <0.1 ng/mL

## 2019-04-19 LAB — BRAIN NATRIURETIC PEPTIDE: B Natriuretic Peptide: 450.9 pg/mL — ABNORMAL HIGH (ref 0.0–100.0)

## 2019-04-19 LAB — MAGNESIUM: Magnesium: 1.5 mg/dL — ABNORMAL LOW (ref 1.7–2.4)

## 2019-04-19 LAB — C-REACTIVE PROTEIN: CRP: 1.4 mg/dL — ABNORMAL HIGH (ref <1.0)

## 2019-04-19 MED ORDER — LOPERAMIDE HCL 2 MG PO CAPS
2.0000 mg | ORAL_CAPSULE | Freq: Four times a day (QID) | ORAL | Status: DC | PRN
  Administered 2019-04-19 – 2019-04-20 (×2): 2 mg via ORAL
  Filled 2019-04-19 (×2): qty 1

## 2019-04-19 MED ORDER — DEXTROSE 50 % IV SOLN
INTRAVENOUS | Status: AC
  Administered 2019-04-19: 21:00:00
  Filled 2019-04-19: qty 50

## 2019-04-19 MED ORDER — LOPERAMIDE HCL 2 MG PO TABS: 2.0000 mg | ORAL_TABLET | Freq: Four times a day (QID) | ORAL | 0 refills | Status: DC | PRN

## 2019-04-19 MED ORDER — MAGNESIUM SULFATE IN D5W 1-5 GM/100ML-% IV SOLN
1.0000 g | Freq: Once | INTRAVENOUS | Status: AC
  Administered 2019-04-19: 12:00:00 1 g via INTRAVENOUS
  Filled 2019-04-19: qty 100

## 2019-04-19 MED ORDER — POTASSIUM CHLORIDE CRYS ER 20 MEQ PO TBCR
40.0000 meq | EXTENDED_RELEASE_TABLET | Freq: Once | ORAL | Status: AC
  Administered 2019-04-19: 40 meq via ORAL
  Filled 2019-04-19: qty 2

## 2019-04-19 MED ORDER — DEXTROSE 50 % IV SOLN
INTRAVENOUS | Status: AC
  Filled 2019-04-19: qty 50

## 2019-04-19 MED ORDER — DOXYCYCLINE HYCLATE 100 MG PO CAPS: 100.0000 mg | ORAL_CAPSULE | Freq: Two times a day (BID) | ORAL | 0 refills | Status: DC

## 2019-04-19 MED ORDER — DEXTROSE-NACL 5-0.9 % IV SOLN
INTRAVENOUS | Status: DC
  Administered 2019-04-19: 23:00:00 via INTRAVENOUS

## 2019-04-19 NOTE — Progress Notes (Signed)
Physical Therapy Treatment Patient Details Name: Gregory Moody MRN: [id number] DOB: 11/01/1975 Today's Date: 04/19/2019    History of Present Illness 43 yo male presenting to [facility] ED with complaints of nausea, vomiting, and generalized weakness. Tested COVID positive. PMH including DM type ll with gastroparesis., CKD    PT Comments    [employer] interpreter Legrand Como [phone], patient is very weak. The patient Explained via interpreter that at his baseline, he has to move slowly, sit for a few minutes before getting up due to dizziness. He states that he may go several days without eating. Patient haad a syncopal episode upon standing the first time. He was able to ambulate in room x ~30' with RW.   Follow Up Recommendations  No PT follow up     Equipment Recommendations  None recommended by PT    Recommendations for Other Services       Precautions / Restrictions Precautions Precautions: Fall Precaution Comments: orthostatic    Mobility  Bed Mobility Overal bed mobility: Needs Assistance Bed Mobility: Supine to Sit     Supine to sit: Supervision     General bed mobility comments: heavy use of rails, performed x 2, first time pt fell back onto bed with a near syncopal episode  Transfers Overall transfer level: Needs assistance Equipment used: 2 person hand held assist Transfers: Sit to/from Stand Sit to Stand: Min guard         General transfer comment: Pt initially stood forseveral minutes then sat due to misunderstanding instructions. Pt stood again, then slumped to bed and reported that he "passed out". Stood Ship broker and ambulated to door adn back to bed x 2 wihtout difficulty.  Ambulation/Gait Ambulation/Gait assistance: Min guard Gait Distance (Feet): 30 Feet Assistive device: Rolling walker (2 wheeled) Gait Pattern/deviations: Step-to pattern;Step-through pattern;Staggering right;Staggering left;Trunk flexed Gait velocity: decr   General Gait Details:  staggering gait initially, flexed posture.   Stairs             Wheelchair Mobility    Modified Rankin (Stroke Patients Only)       Balance   Sitting-balance support: No upper extremity supported;Feet supported Sitting balance-Leahy Scale: Fair Sitting balance - Comments: Pt sitting at EOB and then requiring support due to hypotension   Standing balance support: During functional activity;Bilateral upper extremity supported Standing balance-Leahy Scale: Poor Standing balance comment: Reliant on physical A initially, cues for posture                            Cognition Arousal/Alertness: Awake/alert Behavior During Therapy: Flat affect;WFL for tasks assessed/performed Overall Cognitive Status: Within Functional Limits for tasks assessed                                        Exercises Other Exercises Other Exercises: incentive spirometer x 10    General Comments        Pertinent Vitals/Pain Pain Assessment: Faces Faces Pain Scale: Hurts little more Pain Location: feet Pain Descriptors / Indicators: Grimacing Pain Intervention(s): Limited activity within patient's tolerance    Home Living                      Prior Function            PT Goals (current goals can now be found in the care plan section) Acute Rehab PT  Goals Patient Stated Goal: "Feel better" Progress towards PT goals: Progressing toward goals    Frequency    Min 3X/week      PT Plan Current plan remains appropriate    Co-evaluation PT/OT/SLP Co-Evaluation/Treatment: Yes Reason for Co-Treatment: For patient/therapist safety;Complexity of the patient's impairments (multi-system involvement)   OT goals addressed during session: ADL's and self-care      AM-PAC PT "6 Clicks" Mobility   Outcome Measure  Help needed turning from your back to your side while in a flat bed without using bedrails?: A Little Help needed moving from lying on your  back to sitting on the side of a flat bed without using bedrails?: A Little Help needed moving to and from a bed to a chair (including a wheelchair)?: A Lot Help needed standing up from a chair using your arms (e.g., wheelchair or bedside chair)?: A Lot   Help needed climbing 3-5 steps with a railing? : Total 6 Click Score: 11    End of Session Equipment Utilized During Treatment: Gait belt Activity Tolerance: Patient limited by fatigue Patient left: in chair;with call bell/phone within reach;with chair alarm set Nurse Communication: Mobility status PT Visit Diagnosis: Difficulty in walking, not elsewhere classified (R26.2);Dizziness and giddiness (R42)     Time: 7341-9379 PT Time Calculation (min) (ACUTE ONLY): 45 min  Charges:  $Gait Training: 8-22 mins                     [place]  Office [phone]    Claretha Cooper 04/19/2019, 2:52 PM

## 2019-04-19 NOTE — Discharge Instructions (Signed)
Follow with Primary MD [place] in 7 days   Get CBC, CMP, 2 view Chest X ray -  checked next visit within 1 week by Primary MD    Activity: As tolerated with Full fall precautions use walker/cane & assistance as needed.  Keep your left knee wound clean and dry at all times.  Disposition Home    Diet: Heart Healthy  Low Carb  Accuchecks 4 times/day, Once in AM empty stomach and then before each meal. Log in all results and show them to your Prim.MD in 3 days. If any glucose reading is under 80 or above 300 call your Prim MD immidiately. Follow Low glucose instructions for glucose under 80 as instructed.  Special Instructions: If you have smoked or chewed Tobacco  in the last 2 yrs please stop smoking, stop any regular Alcohol  and or any Recreational drug use.  On your next visit with your primary care physician please Get Medicines reviewed and adjusted.  Please request your Prim.MD to go over all Hospital Tests and Procedure/Radiological results at the follow up, please get all Hospital records sent to your Prim MD by signing hospital release before you go home.  If you experience worsening of your admission symptoms, develop shortness of breath, life threatening emergency, suicidal or homicidal thoughts you must seek medical attention immediately by calling 911 or calling your MD immediately  if symptoms less severe.  You Must read complete instructions/literature along with all the possible adverse reactions/side effects for all the Medicines you take and that have been prescribed to you. Take any new Medicines after you have completely understood and accpet all the possible adverse reactions/side effects.       Person Under Monitoring Name: Gregory Moody  Location: [address] [place] [address]   Infection Prevention Recommendations for Individuals Confirmed to have, or Being Evaluated for, 2019 Novel Coronavirus (COVID-19) Infection  Who Receive Care at Home  Individuals who are confirmed to have, or are being evaluated for, COVID-19 should follow the prevention steps below until a healthcare provider or local or state health department says they can return to normal activities.  Stay home except to get medical care You should restrict activities outside your home, except for getting medical care. Do not go to work, school, or public areas, and do not use public transportation or taxis.  Call ahead before visiting your doctor Before your medical appointment, call the healthcare provider and tell them that you have, or are being evaluated for, COVID-19 infection. This will help the healthcare providers office take steps to keep other people from getting infected. Ask your healthcare provider to call the local or state health department.  Monitor your symptoms Seek prompt medical attention if your illness is worsening (e.g., difficulty breathing). Before going to your medical appointment, call the healthcare provider and tell them that you have, or are being evaluated for, COVID-19 infection. Ask your healthcare provider to call the local or state health department.  Wear a facemask You should wear a facemask that covers your nose and mouth when you are in the same room with other people and when you visit a healthcare provider. People who live with or visit you should also wear a facemask while they are in the same room with you.  Separate yourself from other people in your home As much as possible, you should stay in a different room from other people in your home. Also, you should use a  separate bathroom, if available.  Avoid sharing household items You should not share dishes, drinking glasses, cups, eating utensils, towels, bedding, or other items with other people in your home. After using these items, you should wash them thoroughly with soap and water.  Cover your coughs and sneezes Cover your mouth and  nose with a tissue when you cough or sneeze, or you can cough or sneeze into your sleeve. Throw used tissues in a lined trash can, and immediately wash your hands with soap and water for at least 20 seconds or use an alcohol-based hand rub.  Wash your [facility] your hands often and thoroughly with soap and water for at least 20 seconds. You can use an alcohol-based hand sanitizer if soap and water are not available and if your hands are not visibly dirty. Avoid touching your eyes, nose, and mouth with unwashed hands.   Prevention Steps for Caregivers and Household Members of Individuals Confirmed to have, or Being Evaluated for, COVID-19 Infection Being Cared for in the Home  If you live with, or provide care at home for, a person confirmed to have, or being evaluated for, COVID-19 infection please follow these guidelines to prevent infection:  Follow healthcare providers instructions Make sure that you understand and can help the patient follow any healthcare provider instructions for all care.  Provide for the patients basic needs You should help the patient with basic needs in the home and provide support for getting groceries, prescriptions, and other personal needs.  Monitor the patients symptoms If they are getting sicker, call his or her medical provider and tell them that the patient has, or is being evaluated for, COVID-19 infection. This will help the healthcare providers office take steps to keep other people from getting infected. Ask the healthcare provider to call the local or state health department.  Limit the number of people who have contact with the patient  If possible, have only one caregiver for the patient.  Other household members should stay in another home or place of residence. If this is not possible, they should stay  in another room, or be separated from the patient as much as possible. Use a separate bathroom, if available.  Restrict visitors  who do not have an essential need to be in the home.  Keep older adults, very young children, and other sick people away from the patient Keep older adults, very young children, and those who have compromised immune systems or chronic health conditions away from the patient. This includes people with chronic heart, lung, or kidney conditions, diabetes, and cancer.  Ensure good ventilation Make sure that shared spaces in the home have good air flow, such as from an air conditioner or an opened window, weather permitting.  Wash your hands often  Wash your hands often and thoroughly with soap and water for at least 20 seconds. You can use an alcohol based hand sanitizer if soap and water are not available and if your hands are not visibly dirty.  Avoid touching your eyes, nose, and mouth with unwashed hands.  Use disposable paper towels to dry your hands. If not available, use dedicated cloth towels and replace them when they become wet.  Wear a facemask and gloves  Wear a disposable facemask at all times in the room and gloves when you touch or have contact with the patients blood, body fluids, and/or secretions or excretions, such as sweat, saliva, sputum, nasal mucus, vomit, urine, or feces.  Ensure the mask  fits over your nose and mouth tightly, and do not touch it during use.  Throw out disposable facemasks and gloves after using them. Do not reuse.  Wash your hands immediately after removing your facemask and gloves.  If your personal clothing becomes contaminated, carefully remove clothing and launder. Wash your hands after handling contaminated clothing.  Place all used disposable facemasks, gloves, and other waste in a lined container before disposing them with other household waste.  Remove gloves and wash your hands immediately after handling these items.  Do not share dishes, glasses, or other household items with the patient  Avoid sharing household items. You should not  share dishes, drinking glasses, cups, eating utensils, towels, bedding, or other items with a patient who is confirmed to have, or being evaluated for, COVID-19 infection.  After the person uses these items, you should wash them thoroughly with soap and water.  Wash laundry thoroughly  Immediately remove and wash clothes or bedding that have blood, body fluids, and/or secretions or excretions, such as sweat, saliva, sputum, nasal mucus, vomit, urine, or feces, on them.  Wear gloves when handling laundry from the patient.  Read and follow directions on labels of laundry or clothing items and detergent. In general, wash and dry with the warmest temperatures recommended on the label.  Clean all areas the individual has used often  Clean all touchable surfaces, such as counters, tabletops, doorknobs, bathroom fixtures, toilets, phones, keyboards, tablets, and bedside tables, every day. Also, clean any surfaces that may have blood, body fluids, and/or secretions or excretions on them.  Wear gloves when cleaning surfaces the patient has come in contact with.  Use a diluted bleach solution (e.g., dilute bleach with 1 part bleach and 10 parts water) or a household disinfectant with a label that says EPA-registered for coronaviruses. To make a bleach solution at home, add 1 tablespoon of bleach to 1 quart (4 cups) of water. For a larger supply, add  cup of bleach to 1 gallon (16 cups) of water.  Read labels of cleaning products and follow recommendations provided on product labels. Labels contain instructions for safe and effective use of the cleaning product including precautions you should take when applying the product, such as wearing gloves or eye protection and making sure you have good ventilation during use of the product.  Remove gloves and wash hands immediately after cleaning.  Monitor yourself for signs and symptoms of illness Caregivers and household members are considered close  contacts, should monitor their health, and will be asked to limit movement outside of the home to the extent possible. Follow the monitoring steps for close contacts listed on the symptom monitoring form.   ? If you have additional questions, contact your local health department or call the epidemiologist on call at [phone] (available 24/7). ? This guidance is subject to change. For the most up-to-date guidance from [facility], please refer to their website: [email or web address]

## 2019-04-19 NOTE — Care Management (Signed)
Hospital telephone follow-up appointment scheduled with: [facility] on April 27, 2019 @ 0900; AVS updated.  Midge Minium RN, BSN, NCM-BC, ACM-RN [phone]

## 2019-04-19 NOTE — Significant Event (Signed)
Blood glucose decreased to 19, IV D50 x 1 was given with improvement in BG to 80s, BG continue to drop to hypoglycemic levels and patient was with poor appetite. IV D5NS was started.

## 2019-04-19 NOTE — Progress Notes (Signed)
Multiple attempts made to contact Sister and Son, did speak with daughter, no transportation arrangements could be made, did not get to speak to the sister which is the person the patient stated would be picking him up. Daughter stated she was unable to get aunt on the phone. Notified CM, and charge nurse of situation.

## 2019-04-19 NOTE — Progress Notes (Signed)
Occupational Therapy Treatment Patient Details Name: Gregory Moody MRN: [id number] DOB: 08/20/1976 Today's Date: 04/19/2019    History of present illness 43 yo male presenting to [facility] ED with complaints of nausea, vomiting, and generalized weakness. Tested COVID positive. PMH including DM type ll with gastroparesis., CKD   OT comments  Interpreter [phone] Legrand [place]) used during session. Pt seen with PT as cotreat. Slowly mobilizes to EOB with heavy use of rails. Able to stand with minguard A, however sat unexpectedly. Stood again, then slumped to bed and reported that he "passed out". Pt apparently orthostatic (sitting 147/78; after ambulating 116/95; after sitting @ 3 min 89/69). After long discussion with the pt, he states that the same thing happens at home when he get gets up "too fast" or "doesn't eat for days". On third trial, pt able to stand and walk @ 50 ft without difficulty using RW and minguard A. Pt states "it's worse than usual". Pt feels family will be able to assist as needed. Recommend 3in1 to use of BSC and shower chair. Educate pt on need to have someone with him whenever he transferred into tub/showered. Pt verbalized understanding.   Follow Up Recommendations  Home health OT;Supervision/Assistance - 24 hour    Equipment Recommendations  3 in 1 bedside commode    Recommendations for Other Services      Precautions / Restrictions Precautions Precautions: Fall Precaution Comments: orthostatic       Mobility Bed Mobility Overal bed mobility: Needs Assistance Bed Mobility: Supine to Sit     Supine to sit: Supervision     General bed mobility comments: heavy use of rails  Transfers Overall transfer level: Needs assistance   Transfers: Sit to/from Stand Sit to Stand: Min guard         General transfer comment: Pt initially stood forseveral minutes then sat due to misunderstanding instructions. Pt stood again, then slumped to bed adn reported that  he "passed out". Stood Ship broker and ambulated to door adn back to bed x 2 wihtout difficulty.     Balance     Sitting balance-Leahy Scale: Fair Sitting balance - Comments: Pt sitting at EOB and then requiring support due to hypotension   Standing balance support: During functional activity;Bilateral upper extremity supported Standing balance-Leahy Scale: Poor                             ADL either performed or assessed with clinical judgement   ADL Overall ADL's : Needs assistance/impaired                                     Functional mobility during ADLs: Minimal assistance;Rolling walker;Cueing for safety General ADL Comments: Pt able to ambulate and complete simulated toilet transfer. Able to stand and donn gown without overt LOB. States family will assist as needed.      Vision       Perception     Praxis      Cognition Arousal/Alertness: Awake/alert Behavior During Therapy: Flat affect Overall Cognitive Status: No family/caregiver present to determine baseline cognitive functioning                                          Exercises Exercises: Other exercises Other Exercises Other Exercises: incentive spirometer  x 10   Shoulder Instructions       General Comments      Pertinent Vitals/ Pain       Pain Assessment: Faces Faces Pain Scale: Hurts little more Pain Location: feet Pain Descriptors / Indicators: Grimacing Pain Intervention(s): Limited activity within patient's tolerance  Home Living                                          Prior Functioning/Environment              Frequency  Min 3X/week        Progress Toward Goals  OT Goals(current goals can now be found in the care plan section)  Progress towards OT goals: Progressing toward goals  Acute Rehab OT Goals Patient Stated Goal: "Feel better" OT Goal Formulation: With patient Time For Goal Achievement:  05/02/19 Potential to Achieve Goals: Good ADL Goals Pt Will Perform Grooming: with min guard assist;standing Pt Will Perform Upper Body Dressing: with set-up;with supervision;sitting Pt Will Perform Lower Body Dressing: with min guard assist;sit to/from stand;sitting/lateral leans Pt Will Transfer to Toilet: with min guard assist;ambulating;bedside commode Pt Will Perform Toileting - Clothing Manipulation and hygiene: with min guard assist;sit to/from stand;sitting/lateral leans Additional ADL Goal #1: Pt will perform bed mobility with supervision in preparation for ADLs  Plan Discharge plan remains appropriate    Co-evaluation    PT/OT/SLP Co-Evaluation/Treatment: Yes Reason for Co-Treatment: For patient/therapist safety   OT goals addressed during session: ADL's and self-care      AM-PAC OT "6 Clicks" Daily Activity     Outcome Measure   Help from another person eating meals?: None Help from another person taking care of personal grooming?: A Little Help from another person toileting, which includes using toliet, bedpan, or urinal?: A Little Help from another person bathing (including washing, rinsing, drying)?: A Little Help from another person to put on and taking off regular upper body clothing?: A Little Help from another person to put on and taking off regular lower body clothing?: A Little 6 Click Score: 19    End of Session Equipment Utilized During Treatment: Gait belt;Rolling walker  OT Visit Diagnosis: Unsteadiness on feet (R26.81);Other abnormalities of gait and mobility (R26.89);Muscle weakness (generalized) (M62.81)   Activity Tolerance Patient tolerated treatment well   Patient Left in chair;with call bell/phone within reach;with chair alarm set   Nurse Communication Mobility status;Other (comment)(Orthostatic; DC plan)        Time: 1120(1120)-1215 OT Time Calculation (min): 55 min  Charges: OT General Charges $OT Visit: 1 Visit OT Treatments $Self  Care/Home Management : 23-37 mins  Maurie Boettcher, OT/L   Acute OT Clinical Specialist [place] Pager [phone] Office [phone]    [facility] 04/19/2019, 2:17 PM

## 2019-04-19 NOTE — TOC Transition Note (Signed)
Transition of Care [facility]) - CM/SW Discharge Note   Patient Details  Name: Gregory Moody MRN: [id number] Date of Birth: 1976-09-22  Transition of Care [facility]) CM/SW Contact:  Midge Minium RN, BSN, NCM-BC, ACM-RN [phone] (working remotely) Phone Number: 04/19/2019, 12:29 PM   Clinical Narrative:    CM following for dispositional needs. CM spoke to the patient via phone to discuss the POC. Patient states living at home with his sister and being independent using AD PTA. PCP verified at: [facility]. CM arranged a hospital telephone f/u appointment, with the patient made aware; AVS updated. [facility]/DME needs at discharge. HH referral given to Santiago Glad, [place] liaison for HHRN/PT/SW (assist with community needs). DME referral given to Learta Codding, [facility] liaison. The patient will be provided with a BSC and thermometer before discharge. Patient indicated his family will provide transportation home. CM encouraged the patient to utilize [facility] for his medication needs for discounted prescriptions, with the patient verbalizing understanding. No further needs from CM.   Final next level of care: Home w Home Health Services Barriers to Discharge: No Barriers Identified   Patient Goals and CMS Choice   CMS Medicare.gov Compare Post Acute Care list provided to:: Patient([facility]/DME) Choice offered to / list presented to : Patient([facility])  Discharge Plan and Services In-house Referral: PCP / [facility], Financial Counselor Discharge Planning Services: CM Consult, [facility], Follow-up appt scheduled Post Acute Care Choice: Durable Medical Equipment, Home Health          DME Arranged: Bedside commode DME Agency: [place] Date DME Agency Contacted: 04/19/19 Time DME Agency Contacted: 1221 Representative spoke with at DME Agency: Learta Codding liaison HH Arranged: RN, Disease Management, PT, Social Work [facility]  Agency: [place] ([place]) Date [place]: 04/19/19 Time [place]: 1225 Representative spoke with at [place]: [place] ([place]) Interventions     Readmission Risk Interventions Readmission Risk Prevention Plan 04/19/2019 03/07/2019  Transportation Screening Complete Complete  PCP or Specialist Appt within 3-5 Days - Complete  Social Work Consult for [place] Planning/Counseling - (No Data)  Palliative Care Screening - Not Applicable  Medication Review Press photographer) Complete Complete  PCP or Specialist appointment within 3-5 days of discharge Complete -  [place] or [place] Complete -  SW Recovery Care/Counseling Consult Complete -  Palliative Care Screening Not Applicable -  [place] Not Applicable -  Some recent data might be hidden

## 2019-04-19 NOTE — Discharge Summary (Addendum)
Gregory Moody MMN:[id number] DOB: [birth year] DOA: 04/14/2019  PCP: [place] date: 04/14/2019  Discharge date: 04/19/2019  Admitted From: Home  Disposition:  Home   Recommendations for Outpatient Follow-up:   Follow up with PCP in 1-2 weeks  PCP Please obtain BMP/CBC, 2 view CXR in 1week,  (see Discharge instructions)   PCP Please follow up on the following pending results: For outpatient GI follow-up for an attack of acute pancreatitis.   Home Health: None   Equipment/Devices: None  Consultations: None  Discharge Condition: Stable    CODE STATUS: Full    Diet Recommendation: Heart Healthy Low Carb    CC - Diarrhea   Brief history of present illness from the day of admission and additional interim summary     43 year old gentleman with prior history of insulin-dependent diabetes mellitus, diabetic gastro paresis presents to ED with nausea vomiting and generalized weakness for 2 days. On arrival patient was found to be hypotensive. He was also tested positive for coronavirus at [facility] and found to be in acute on stage III CKD, he was admitted to [facility] where he was diagnosed with staph hominis bacteremia possible contamination, he was stabilized and then transferred to my care on 04/18/2019 on day 4 of his hospital stay.                                                                 Hospital Course   1.  Acute Covid 19 Viral Pneumonitis during the ongoing 2020 Covid 19 Pandemic with mild gastroenteritis - no hypoxia or shortness of breath, remained stable on room air, continue to monitor with supportive care.  Stable inflammatory markers.  Gastroenteritis symptoms have improved and now at baseline.  Will be discharged home with PCP follow-up.  PRN Imodium given C.  difficile was ruled out.  2.  Mild acute pancreatitis much improved.  Completely pain and symptom-free tolerating soft diet, wants to go home, discharge and follow-up with PCP and outpatient GI follow-up he does have gallbladder sludge but he had no elevation of LFTs hence doubt this was related to sludge or CBD obstruction.  His triglycerides were unremarkable as well, he will definitely benefit from outpatient GI evaluation 1 time post discharge.  Request PCP to arrange.  Lipase     Component Value Date/Time   LIPASE 112 (H) 04/18/2019 0800   3.  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  Improving with hydration.  Continue to monitor.  Good urine output, no signs of uremia.  4.  Anemia of chronic disease.  Stable.  5.  Large left knee abrasion.  Local wound care was needed.  No signs of active infection.  Keep wound clean and dry and follow with PCP, culture was superficial wound.  6.  1 set positive for staph hominis coag negative bacteremia at [facility] 4 days ago.    He was on Rocephin with repeat cultures negative, likely contamination discussed with Dr Prince Rome ID, will be placed on 7 days of oral doxycycline and discharged home, follow with PCP, if signs of bacteremia again then will need admission and thorough bacteremia work-up.     7.  DM type II.  Currently hypoglycemic.  Continue home regimen, was given diabetes and insulin education here.Poor outpatient control due to hyperglycemia.  Follow with PCP.  Lab Results  Component Value Date   HGBA1C 8.1 (H) 03/30/2019   8.  Hypomagnesemia and hypokalemia.  Both replaced prior to discharge.  Discharge diagnosis     Principal Problem:   Acute renal failure (ARF) (HCC) Active Problems:   Diabetes mellitus with hyperglycemia (HCC)   GERD (gastroesophageal reflux disease)   Diabetic gastroparesis (HCC)   Nausea and vomiting   CAP (community acquired pneumonia) due to MSSA (methicillin sensitive Staphylococcus aureus) ([place])    COVID-19 virus infection    Discharge instructions    Discharge Instructions    Discharge instructions   Complete by: As directed    Follow with Primary MD [place] in 7 days   Get CBC, CMP, 2 view Chest X ray -  checked next visit within 1 week by Primary MD    Activity: As tolerated with Full fall precautions use walker/cane & assistance as needed.  Keep your left knee wound clean and dry at all times.  Disposition Home    Diet: Heart Healthy  Low Carb  Accuchecks 4 times/day, Once in AM empty stomach and then before each meal. Log in all results and show them to your Prim.MD in 3 days. If any glucose reading is under 80 or above 300 call your Prim MD immidiately. Follow Low glucose instructions for glucose under 80 as instructed.  Special Instructions: If you have smoked or chewed Tobacco  in the last 2 yrs please stop smoking, stop any regular Alcohol  and or any Recreational drug use.  On your next visit with your primary care physician please Get Medicines reviewed and adjusted.  Please request your Prim.MD to go over all Hospital Tests and Procedure/Radiological results at the follow up, please get all Hospital records sent to your Prim MD by signing hospital release before you go home.  If you experience worsening of your admission symptoms, develop shortness of breath, life threatening emergency, suicidal or homicidal thoughts you must seek medical attention immediately by calling 911 or calling your MD immediately  if symptoms less severe.  You Must read complete instructions/literature along with all the possible adverse reactions/side effects for all the Medicines you take and that have been prescribed to you. Take any new Medicines after you have completely understood and accpet all the possible adverse reactions/side effects.   Increase activity slowly   Complete by: As directed    MyChart COVID-19 home monitoring program   Complete by: Apr 19, 2019    Is the patient willing to use the [place] for home monitoring?: Yes   MyChart COVID-19 home monitoring program   Complete by: Apr 19, 2019    Is the patient willing to use the [place] for home monitoring?: Yes   Temperature monitoring   Complete by: Apr 19, 2019    After how many days would you like to receive a notification of this patient's flowsheet entries?: 1  Discharge Medications   Allergies as of 04/19/2019   No Known Allergies     Medication List    TAKE these medications   aspirin EC 81 MG tablet Take 81 mg by mouth daily.   doxycycline 100 MG capsule Commonly known as: VIBRAMYCIN Take 1 capsule (100 mg total) by mouth 2 (two) times daily.   insulin NPH-regular Human (70-30) 100 UNIT/ML injection Inject 20 Units into the skin 2 (two) times daily with a meal. 25 units with breakfast in am and 35 units with supper   loperamide 2 MG tablet Commonly known as: Imodium A-D Take 1 tablet (2 mg total) by mouth 4 (four) times daily as needed for diarrhea or loose stools.   ondansetron 4 MG tablet Commonly known as: ZOFRAN Take 1 tablet (4 mg total) by mouth every 6 (six) hours as needed for nausea.   pantoprazole 40 MG tablet Commonly known as: PROTONIX Take 1 tablet (40 mg total) by mouth 2 (two) times daily before a meal.       Follow-up Information    [facility] Follow up on 04/27/2019.   Why: at 9am for your hospital follow-up telephone appointment: Please be available for the call.  Contact information: [place] [address] [phone]           Major procedures and Radiology Reports - PLEASE review detailed and final reports thoroughly  -        Ct Abdomen Pelvis Wo Contrast  Result Date: 04/17/2019 CLINICAL DATA:  Abdominal pain, nausea, vomiting, and diarrhea. Acute renal failure. COVID-19 positive. History of diabetes. EXAM: CT ABDOMEN AND PELVIS WITHOUT CONTRAST TECHNIQUE:  Multidetector CT imaging of the abdomen and pelvis was performed following the standard protocol without IV contrast. COMPARISON:  03/17/2019 FINDINGS: Lower chest: Mild bibasilar atelectasis and/or scarring. No pleural effusion. Normal heart size. Hepatobiliary: No focal liver abnormality is seen. There is new hyperdense material in an underdistended gallbladder which may reflect sludge. There are no regional inflammatory changes. No biliary dilatation is seen. Pancreas: Unremarkable. Spleen: Unremarkable. Adrenals/Urinary Tract: Unremarkable adrenal glands. No renal calculi or hydronephrosis. Decompressed bladder with a Foley catheter in place. Stomach/Bowel: The stomach is unremarkable. There is no evidence of bowel obstruction. Liquid stool is present in the rectum consistent with the history of a diarrheal illness. Mild sigmoid colon diverticulosis is noted without evidence of diverticulitis. The appendix is unremarkable. Vascular/Lymphatic: Normal caliber of the abdominal aorta. No enlarged lymph nodes. Reproductive: Unremarkable prostate. Other: No intraperitoneal free fluid or pneumoperitoneum. Mild stranding and small foci of gas in the anterior abdominal wall likely reflective of subcutaneous medication injection. Musculoskeletal: No acute osseous abnormality or suspicious osseous lesion. Suspected healed pars defects bilaterally at L5 with unchanged grade 1 anterolisthesis of L5 on S1. Old fractures of the left ischium and proximal left femur. IMPRESSION: 1. No acute abnormality identified in the abdomen or pelvis. 2. Liquid stool in the rectum consistent with the history of a diarrheal illness. 3. Suspected gallbladder sludge. 4. Mild sigmoid colon diverticulosis without evidence of diverticulitis. Electronically Signed   By: Logan Bores M.D.   On: 04/17/2019 13:50   Dg Chest 2 View  Result Date: 03/30/2019 CLINICAL DATA:  Hypothermia EXAM: CHEST - 2 VIEW COMPARISON:  03/17/2019, 06/20/2018, CT  02/13/2019 FINDINGS: Right upper lobe ovoid nodular opacity as before. No new ac airspace disease or effusion. Stable cardiomediastinal silhouette. No pneumothorax. IMPRESSION: No active cardiopulmonary disease. Stable right upper lobe nodular opacity. Electronically  Signed   By: Donavan Foil M.D.   On: 03/30/2019 22:53   Dg Abd 1 View  Result Date: 04/14/2019 CLINICAL DATA:  History of hydronephrosis. EXAM: ABDOMEN - 1 VIEW COMPARISON:  CT abdomen pelvis 03/17/2019 FINDINGS: Paucity of small bowel gas. Gas within the stomach. Left lung bases clear. Osseous structures unremarkable. Supine evaluation limited for the detection of free intraperitoneal air. Chronic changes proximal left femur/greater trochanter. IMPRESSION: Paucity of small bowel gas without evidence for overt small bowel obstruction. Electronically Signed   By: Lovey Newcomer M.D.   On: 04/14/2019 17:46   US Renal  Result Date: 04/15/2019 CLINICAL DATA:  Acute kidney injury. EXAM: RENAL / URINARY TRACT ULTRASOUND COMPLETE COMPARISON:  CT abdomen 03/17/2019 FINDINGS: Right Kidney: Renal measurements: 12.4 x 4.9 x 6.2 cm = volume: 194 mL . Echogenicity within normal limits. No mass or hydronephrosis visualized. Left Kidney: Renal measurements: 11.5 x 5.1 x 5.5 cm = volume: 169 mL. Echogenicity within normal limits. No mass or hydronephrosis visualized. Bladder: Foley catheter in place. The bladder is distended with urine, suggesting the catheter is not functioning. Floor nurse notified of this finding at the time of the scan. IMPRESSION: Normal appearance of the kidneys. Normal size and echogenicity. No hydronephrosis. Foley catheter in the bladder, but the bladder is full of urine, suggesting the catheter is not functioning. Floor nurse notified at the time of the scan. Electronically Signed   By: Nelson Chimes M.D.   On: 04/15/2019 06:58   Dg Chest Port 1 View  Result Date: 04/16/2019 CLINICAL DATA:  43 year old male with pulmonary opacities  presenting for follow-up. EXAM: PORTABLE CHEST 1 VIEW COMPARISON:  Earlier radiograph dated 04/15/2019 and chest radiograph dated 07/03 2 FINDINGS: Left lung base streaky densities appear slightly improved since the earlier radiograph of 04/15/2019. Stable area of opacity in the right upper lobe along the minor fissure again noted. No pleural effusion or pneumothorax. The cardiac silhouette is within normal limits. No acute osseous pathology. IMPRESSION: Overall slight interval improvement of bibasilar densities since the earlier radiograph. Electronically Signed   By: Anner Crete M.D.   On: 04/16/2019 14:16   Portable Chest 1 View  Result Date: 04/15/2019 CLINICAL DATA:  Shortness of breath.  COVID-19 positive. EXAM: PORTABLE CHEST 1 VIEW COMPARISON:  Chest x-ray dated April 13, 2019. FINDINGS: The heart size and mediastinal contours are within normal limits. Normal pulmonary vascularity. Mild patchy opacity at the left greater than right lung bases, more prominent when compared to prior study. No pleural effusion or pneumothorax. Chronic masslike opacity in the peripheral right upper lobe is unchanged. IMPRESSION: 1. Mild bibasilar patchy opacities, likely reflecting COVID-19 pneumonia. Electronically Signed   By: Titus Dubin M.D.   On: 04/15/2019 09:38   Dg Chest Port 1 View  Result Date: 04/13/2019 CLINICAL DATA:  Chest discomfort.  History of tuberculosis. EXAM: PORTABLE CHEST 1 VIEW COMPARISON:  March 30, 2019 FINDINGS: The nodular density in the right upper lung is stable and chronic. There is mild volume loss in the right upper lobe. The heart and mediastinum is stable. There is some increased opacity in the region of the left perihilar region, not seen previously. No other changes. IMPRESSION: Suspected developing subtle infiltrate in the left perihilar region. A PA and lateral chest x-ray may better evaluate. Alternatively, recommend attention on follow-up. No other interval change.  Electronically Signed   By: Dorise Bullion III M.D   On: 04/13/2019 22:55   Dg Knee Complete 4  Views Left  Result Date: 03/31/2019 CLINICAL DATA:  Abscess. Open wound anterior left knee. EXAM: LEFT KNEE - COMPLETE 4+ VIEW COMPARISON:  Radiographs 2 weeks ago 03/17/2019 FINDINGS: No evidence of fracture, dislocation, or joint effusion. No bony destructive change or periosteal reaction. Soft tissue edema anteriorly with skin irregularity overlying the patella. No tracking soft tissue air. No radiopaque foreign body. IMPRESSION: 1. Soft tissue edema anterior to the patella. No radiopaque foreign body or osseous abnormality. 2. No radiographic evidence of osteomyelitis. Electronically Signed   By: Keith Rake M.D.   On: 03/31/2019 01:13    Micro Results     Recent Results (from the past 240 hour(s))  SARS Coronavirus 2 (CEPHEID- Performed in [facility] hospital lab), Hosp Order     Status: Abnormal   Collection Time: 04/13/19 10:02 PM   Specimen: Nasopharyngeal Swab  Result Value Ref Range Status   SARS Coronavirus 2 POSITIVE (A) NEGATIVE Final    Comment: RESULT CALLED TO, READ BACK BY AND VERIFIED WITH: RACHEL HAYDEN ON 04/13/2019 AT 2256 QSD (NOTE) If result is NEGATIVE SARS-CoV-2 target nucleic acids are NOT DETECTED. The SARS-CoV-2 RNA is generally detectable in upper and lower  respiratory specimens during the acute phase of infection. The lowest  concentration of SARS-CoV-2 viral copies this assay can detect is 250  copies / mL. A negative result does not preclude SARS-CoV-2 infection  and should not be used as the sole basis for treatment or other  patient management decisions.  A negative result may occur with  improper specimen collection / handling, submission of specimen other  than nasopharyngeal swab, presence of viral mutation(s) within the  areas targeted by this assay, and inadequate number of viral copies  (<250 copies / mL). A negative result must be combined with  clinical  observations, patient history, and epidemiological information. If result is POSITIVE SARS-CoV-2 target nucleic acids are DETECTED.  The SARS-CoV-2 RNA is generally detectable in upper and lower  respiratory specimens during the acute phase of infection.  Positive  results are indicative of active infection with SARS-CoV-2.  Clinical  correlation with patient history and other diagnostic information is  necessary to determine patient infection status.  Positive results do  not rule out bacterial infection or co-infection with other viruses. If result is PRESUMPTIVE POSTIVE SARS-CoV-2 nucleic acids MAY BE PRESENT.   A presumptive positive result was obtained on the submitted specimen  and confirmed on repeat testing.  While 2019 novel coronavirus  (SARS-CoV-2) nucleic acids may be present in the submitted sample  additional confirmatory testing may be necessary for epidemiological  and / or clinical management purposes  to differentiate between  SARS-CoV-2 and other Sarbecovirus currently known to infect humans.  If clinically indicated additional testing with an alternate test  methodology [phone])  is advised. The SARS-CoV-2 RNA is generally  detectable in upper and lower respiratory specimens during the acute  phase of infection. The expected result is Negative. Fact Sheet for Patients:  StrictlyIdeas.no Fact Sheet for Healthcare Providers: BankingDealers.co.za This test is not yet approved or cleared by the [place] FDA and has been authorized for detection and/or diagnosis of SARS-CoV-2 by FDA under an Emergency Use Authorization (EUA).  This EUA will remain in effect (meaning this test can be used) for the duration of the COVID-19 declaration under Section 564(b)(1) of the Act, 21 U.S.C. section 360bbb-3(b)(1), unless the authorization is terminated or revoked sooner. Performed at [facility], [address] [place],  [place] [address]   Culture, blood (routine x 2)     Status: Abnormal   Collection Time: 04/13/19 11:15 PM   Specimen: BLOOD  Result Value Ref Range Status   Specimen Description   Final    BLOOD LEFT ANTECUBITAL Performed at [facility], [address] [place], [place] [address]    Special Requests   Final    BOTTLES DRAWN AEROBIC AND ANAEROBIC Blood Culture adequate volume Performed at [facility], [address] [place], [place] [address]    Culture  Setup Time   Final    GRAM POSITIVE COCCI AEROBIC BOTTLE ONLY CRITICAL RESULT CALLED TO, READ BACK BY AND VERIFIED WITH: BRANDY DAVIS AT 1707 ON 04/14/2019 [place]. ALSO CALLED TO KAREN PATTON PHARMD AT [facility] AT 8657 ON 04/14/2019 [place]. Sun Performed at [facility], [place] [address], [place], [place] [address]    Culture STAPHYLOCOCCUS HOMINIS (A)  Final   Report Status 04/16/2019 FINAL  Final   Organism ID, Bacteria STAPHYLOCOCCUS HOMINIS  Final      Susceptibility   Staphylococcus hominis - MIC*    CIPROFLOXACIN <=0.5 SENSITIVE Sensitive     ERYTHROMYCIN >=8 RESISTANT Resistant     GENTAMICIN <=0.5 SENSITIVE Sensitive     OXACILLIN <=0.25 SENSITIVE Sensitive     TETRACYCLINE <=1 SENSITIVE Sensitive     VANCOMYCIN 1 SENSITIVE Sensitive     TRIMETH/SULFA <=10 SENSITIVE Sensitive     CLINDAMYCIN <=0.25 SENSITIVE Sensitive     RIFAMPIN <=0.5 SENSITIVE Sensitive     Inducible Clindamycin NEGATIVE Sensitive     * STAPHYLOCOCCUS HOMINIS  Culture, blood (routine x 2)     Status: Abnormal   Collection Time: 04/13/19 11:15 PM   Specimen: BLOOD  Result Value Ref Range Status   Specimen Description   Final    BLOOD LEFT FOREARM Performed at [facility], [address] [place], [place] [address]    Special Requests   Final    BOTTLES DRAWN AEROBIC AND ANAEROBIC Blood Culture adequate volume Performed at [facility], [address] [place], [place] [address]    Culture  Setup Time   Final    GRAM POSITIVE COCCI AEROBIC BOTTLE ONLY CRITICAL RESULT CALLED TO, READ BACK BY AND VERIFIED WITH: BRANDY DAVIS AT 1707 ON 04/14/2019 [place]. ALSO CALLED TO KAREN PATTON PHARMD AT [facility] AT 4010 ON 04/14/2019 [place]. GRAM STAIN REVIEWED-AGREE WITH RESULT T. TYSOR    Culture (A)  Final    STAPHYLOCOCCUS HOMINIS SUSCEPTIBILITIES PERFORMED ON PREVIOUS CULTURE WITHIN THE LAST 5 DAYS. Performed at [facility], [place] [address] [place], [place] [address]    Report Status 04/16/2019 FINAL  Final  Blood Culture ID Panel (Reflexed)     Status: Abnormal   Collection Time: 04/13/19 11:15 PM  Result Value Ref Range Status   Enterococcus species NOT DETECTED NOT DETECTED Final   Listeria monocytogenes NOT DETECTED NOT DETECTED Final   Staphylococcus species DETECTED (A) NOT DETECTED Final    Comment: Methicillin (oxacillin) susceptible coagulase negative staphylococcus. Possible blood culture contaminant (unless isolated from more than one blood culture draw or clinical case suggests pathogenicity). No antibiotic treatment is indicated for blood  culture contaminants. CRITICAL RESULT CALLED TO, READ BACK BY AND VERIFIED WITH: KAREN PATTON ON 04/14/19 AT 1711 [facility]    Staphylococcus aureus (BCID) NOT DETECTED NOT DETECTED Final   Methicillin resistance NOT DETECTED NOT DETECTED Final   Streptococcus species  NOT DETECTED NOT DETECTED Final   Streptococcus agalactiae NOT DETECTED NOT DETECTED Final   Streptococcus pneumoniae NOT DETECTED NOT DETECTED Final   Streptococcus pyogenes NOT DETECTED NOT DETECTED Final   Acinetobacter baumannii NOT DETECTED NOT DETECTED Final   Enterobacteriaceae species NOT DETECTED NOT DETECTED Final   Enterobacter cloacae complex NOT DETECTED NOT DETECTED Final   Escherichia coli NOT DETECTED NOT DETECTED Final   Klebsiella oxytoca NOT DETECTED NOT DETECTED Final   Klebsiella pneumoniae NOT DETECTED NOT DETECTED  Final   Proteus species NOT DETECTED NOT DETECTED Final   Serratia marcescens NOT DETECTED NOT DETECTED Final   Haemophilus influenzae NOT DETECTED NOT DETECTED Final   Neisseria meningitidis NOT DETECTED NOT DETECTED Final   Pseudomonas aeruginosa NOT DETECTED NOT DETECTED Final   Candida albicans NOT DETECTED NOT DETECTED Final   Candida glabrata NOT DETECTED NOT DETECTED Final   Candida krusei NOT DETECTED NOT DETECTED Final   Candida parapsilosis NOT DETECTED NOT DETECTED Final   Candida tropicalis NOT DETECTED NOT DETECTED Final    Comment: Performed at [facility], [address] [place], [place] [address]  Culture, blood (Routine X 2) w Reflex to ID Panel     Status: None (Preliminary result)   Collection Time: 04/14/19  3:15 PM   Specimen: BLOOD  Result Value Ref Range Status   Specimen Description BLOOD BLOOD RIGHT FOREARM  Final   Special Requests   Final    BOTTLES DRAWN AEROBIC ONLY Blood Culture adequate volume   Culture   Final    NO GROWTH 4 DAYS Performed at [facility], [place] [address], [place], [place] [address]    Report Status PENDING  Incomplete  Culture, blood (Routine X 2) w Reflex to ID Panel     Status: None (Preliminary result)   Collection Time: 04/14/19  3:15 PM   Specimen: BLOOD RIGHT HAND  Result Value Ref Range Status   Specimen Description BLOOD RIGHT HAND  Final   Special Requests   Final    BOTTLES DRAWN AEROBIC ONLY Blood Culture adequate volume   Culture   Final    NO GROWTH 4 DAYS Performed at [facility], [place] [address] [place], [place] [address]    Report Status PENDING  Incomplete  MRSA PCR Screening     Status: None   Collection Time: 04/14/19  7:07 PM   Specimen: Nasopharyngeal  Result Value Ref Range Status   MRSA by PCR NEGATIVE NEGATIVE Final    Comment:        The GeneXpert MRSA Assay (FDA approved for NASAL specimens only), is one component of a comprehensive MRSA colonization surveillance program.  It is not intended to diagnose MRSA infection nor to guide or monitor treatment for MRSA infections. Performed at [facility], [place] [address], [place], [place] [address]   Aerobic Culture (superficial specimen)     Status: None (Preliminary result)   Collection Time: 04/17/19 10:08 AM   Specimen: KNEE; Wound  Result Value Ref Range Status   Specimen Description KNEE LOOK FOR MRSA  Final   Special Requests Immunocompromised  Final   Gram Stain   Final    NO WBC SEEN RARE GRAM POSITIVE COCCI IN PAIRS Performed at [facility], [place] [address] [place], [place] [address]    Culture   Final    FEW STAPHYLOCOCCUS AUREUS RARE GRAM NEGATIVE RODS    Report Status PENDING  Incomplete  Gastrointestinal Panel  by PCR , Stool     Status: None   Collection Time: 04/17/19 10:29 AM   Specimen: Stool  Result Value Ref Range Status   Campylobacter species NOT DETECTED NOT DETECTED Final   Plesimonas shigelloides NOT DETECTED NOT DETECTED Final   Salmonella species NOT DETECTED NOT DETECTED Final   Yersinia enterocolitica NOT DETECTED NOT DETECTED Final   Vibrio species NOT DETECTED NOT DETECTED Final   Vibrio cholerae NOT DETECTED NOT DETECTED Final   Enteroaggregative E coli (EAEC) NOT DETECTED NOT DETECTED Final   Enteropathogenic E coli (EPEC) NOT DETECTED NOT DETECTED Final   Enterotoxigenic E coli (ETEC) NOT DETECTED NOT DETECTED Final   Shiga like toxin producing E coli (STEC) NOT DETECTED NOT DETECTED Final   Shigella/Enteroinvasive E coli (EIEC) NOT DETECTED NOT DETECTED Final   Cryptosporidium NOT DETECTED NOT DETECTED Final   Cyclospora cayetanensis NOT DETECTED NOT DETECTED Final   Entamoeba histolytica NOT DETECTED NOT DETECTED Final   Giardia lamblia NOT DETECTED NOT DETECTED Final   Adenovirus F40/41 NOT DETECTED NOT DETECTED Final   Astrovirus NOT DETECTED NOT DETECTED Final   Norovirus GI/GII NOT DETECTED NOT DETECTED Final   Rotavirus A NOT DETECTED NOT  DETECTED Final   Sapovirus (I, II, IV, and V) NOT DETECTED NOT DETECTED Final    Comment: Performed at [facility], [address] [place], [place] [address]    Today   Subjective    Gregory Moody today has no headache,no chest abdominal pain,no new weakness tingling or numbness, feels much better wants to go home today.     Objective   Blood pressure 111/83, pulse 76, temperature 98.2 F (36.8 C), temperature source Oral, resp. rate 20, height 5\' 8"  (1.727 m), weight 58.6 kg, SpO2 99 %.   Intake/Output Summary (Last 24 hours) at 04/19/2019 1051 Last data filed at 04/19/2019 0405 Gross per 24 hour  Intake 1079.79 ml  Output 1400 ml  Net -320.21 ml    Exam Awake Alert, Oriented x 3, No new F.N deficits, Normal affect [place].AT,PERRAL Supple Neck,No JVD, No cervical lymphadenopathy appriciated.  Symmetrical Chest wall movement, Good air movement bilaterally, CTAB RRR,No Gallops,Rubs or new Murmurs, No Parasternal Heave +ve B.Sounds, Abd Soft, Non tender, No organomegaly appriciated, No rebound -guarding or rigidity. No Cyanosis, Clubbing or edema, knee wound is stable,   Data Review   CBC w Diff:  Lab Results  Component Value Date   WBC 9.5 04/19/2019   HGB 8.8 (L) 04/19/2019   HGB 8.2 (L) 04/14/2019   HCT 27.6 (L) 04/19/2019   PLT 399 04/19/2019   LYMPHOPCT 4 04/19/2019   MONOPCT 3 04/19/2019   EOSPCT 0 04/19/2019   BASOPCT 0 04/19/2019    CMP:  Lab Results  Component Value Date   NA 140 04/19/2019   K 3.2 (L) 04/19/2019   CL 102 04/19/2019   CO2 27 04/19/2019   BUN 27 (H) 04/19/2019   CREATININE 1.33 (H) 04/19/2019   PROT 6.6 04/19/2019   ALBUMIN 2.7 (L) 04/19/2019   BILITOT 0.2 (L) 04/19/2019   ALKPHOS 70 04/19/2019   AST 14 (L) 04/19/2019   ALT 15 04/19/2019  .   Total Time in preparing paper work, data evaluation and todays exam - 53 minutes  Lala Lund M.D on 04/19/2019 at 10:51 AM  [facility]   Office   [phone]

## 2019-04-20 LAB — GLUCOSE, CAPILLARY
Glucose-Capillary: 85 mg/dL (ref 70–99)
Glucose-Capillary: 137 mg/dL — ABNORMAL HIGH (ref 70–99)
Glucose-Capillary: 102 mg/dL — ABNORMAL HIGH (ref 70–99)
Glucose-Capillary: 97 mg/dL (ref 70–99)
Glucose-Capillary: 280 mg/dL — ABNORMAL HIGH (ref 70–99)
Glucose-Capillary: 137 mg/dL — ABNORMAL HIGH (ref 70–99)

## 2019-04-20 LAB — CBC WITH DIFFERENTIAL/PLATELET
Abs Immature Granulocytes: 0.04 10*3/uL (ref 0.00–0.07)
Basophils Absolute: 0 10*3/uL (ref 0.0–0.1)
Basophils Relative: 0 %
Eosinophils Absolute: 0 10*3/uL (ref 0.0–0.5)
Eosinophils Relative: 0 %
HCT: 25.5 % — ABNORMAL LOW (ref 39.0–52.0)
Hemoglobin: 8.2 g/dL — ABNORMAL LOW (ref 13.0–17.0)
Immature Granulocytes: 1 %
Lymphocytes Relative: 28 %
Lymphs Abs: 1.4 10*3/uL (ref 0.7–4.0)
MCH: 31.1 pg (ref 26.0–34.0)
MCHC: 32.2 g/dL (ref 30.0–36.0)
MCV: 96.6 fL (ref 80.0–100.0)
Monocytes Absolute: 0.3 10*3/uL (ref 0.1–1.0)
Monocytes Relative: 6 %
Neutro Abs: 3.1 10*3/uL (ref 1.7–7.7)
Neutrophils Relative %: 65 %
Platelets: 429 10*3/uL — ABNORMAL HIGH (ref 150–400)
RBC: 2.64 MIL/uL — ABNORMAL LOW (ref 4.22–5.81)
RDW: 14.1 % (ref 11.5–15.5)
WBC: 4.8 10*3/uL (ref 4.0–10.5)
nRBC: 0 % (ref 0.0–0.2)

## 2019-04-20 LAB — COMPREHENSIVE METABOLIC PANEL
ALT: 24 U/L (ref 0–44)
AST: 33 U/L (ref 15–41)
Albumin: 2.4 g/dL — ABNORMAL LOW (ref 3.5–5.0)
Alkaline Phosphatase: 63 U/L (ref 38–126)
Anion gap: 10 (ref 5–15)
BUN: 21 mg/dL — ABNORMAL HIGH (ref 6–20)
CO2: 27 mmol/L (ref 22–32)
Calcium: 7.9 mg/dL — ABNORMAL LOW (ref 8.9–10.3)
Chloride: 101 mmol/L (ref 98–111)
Creatinine, Ser: 1.16 mg/dL (ref 0.61–1.24)
GFR calc Af Amer: >60 mL/min (ref >60)
GFR calc non Af Amer: >60 mL/min (ref >60)
Glucose, Bld: 154 mg/dL — ABNORMAL HIGH (ref 70–99)
Potassium: 3.5 mmol/L (ref 3.5–5.1)
Sodium: 138 mmol/L (ref 135–145)
Total Bilirubin: 0.2 mg/dL — ABNORMAL LOW (ref 0.3–1.2)
Total Protein: 5.7 g/dL — ABNORMAL LOW (ref 6.5–8.1)

## 2019-04-20 LAB — BRAIN NATRIURETIC PEPTIDE: B Natriuretic Peptide: 258.1 pg/mL — ABNORMAL HIGH (ref 0.0–100.0)

## 2019-04-20 LAB — D-DIMER, QUANTITATIVE: D-Dimer, Quant: 1.66 ug/mL-FEU — ABNORMAL HIGH (ref 0.00–0.50)

## 2019-04-20 LAB — MAGNESIUM: Magnesium: 1.6 mg/dL — ABNORMAL LOW (ref 1.7–2.4)

## 2019-04-20 LAB — LACTATE DEHYDROGENASE: LDH: 195 U/L — ABNORMAL HIGH (ref 98–192)

## 2019-04-20 LAB — PROCALCITONIN: Procalcitonin: <0.1 ng/mL

## 2019-04-20 MED ORDER — MAGNESIUM SULFATE IN D5W 1-5 GM/100ML-% IV SOLN
1.0000 g | Freq: Once | INTRAVENOUS | Status: AC
  Administered 2019-04-20: 15:00:00 1 g via INTRAVENOUS
  Filled 2019-04-20 (×2): qty 100

## 2019-04-20 MED ORDER — POTASSIUM CHLORIDE CRYS ER 20 MEQ PO TBCR
40.0000 meq | EXTENDED_RELEASE_TABLET | Freq: Once | ORAL | Status: AC
  Administered 2019-04-20: 15:00:00 40 meq via ORAL
  Filled 2019-04-20: qty 2

## 2019-04-20 MED ORDER — LACTATED RINGERS IV SOLN
INTRAVENOUS | Status: AC
  Administered 2019-04-20: 10:00:00 via INTRAVENOUS

## 2019-04-20 MED ORDER — INSULIN ASPART 100 UNIT/ML ~~LOC~~ SOLN
0.0000 [IU] | Freq: Three times a day (TID) | SUBCUTANEOUS | Status: DC
  Administered 2019-04-20: 11:00:00 5 [IU] via SUBCUTANEOUS

## 2019-04-20 NOTE — Progress Notes (Signed)
[facility]                                                                                                                                                                         Patient Demographics  Gregory Moody, is a 43 y.o. male  JKK:[id number]  [id number]  DOB - [birth year]  Admit date - 04/14/2019  Admitting Physician Vianne Bulls, MD  Outpatient Primary MD for the patient is [place]  LOS - 6   No chief complaint on file.       Assessment & Plan    Patient seen briefly today due for discharge soon per Discharge done yesterday by me, patient feels fine.    Mild hypoglycemia last night as his steroids were stopped in anticipation of home discharge but he never left and subsequently got higher than home dose insulin at night which was previously ordered, CBG now stable, not eating well and mildly dehydrated hence we will give him some fluids prior to discharge.  Encouraged him to stay mobile at home.  Currently symptom-free.  Labs are pending will follow prior to discharge.    Medications  Scheduled Meds: . albuterol  2 puff Inhalation Q6H  . aspirin EC  81 mg Oral Daily  . heparin  5,000 Units Subcutaneous Q8H  . insulin aspart protamine- aspart  20 Units Subcutaneous BID WC  . multivitamin with minerals  1 tablet Oral Daily  . mupirocin cream   Topical Daily  . pantoprazole  40 mg Oral BID AC  . vitamin C  500 mg Oral Daily  . zinc sulfate  220 mg Oral Daily   Continuous Infusions: . cefTRIAXone (ROCEPHIN)  IV Stopped (04/18/19 2152)   PRN Meds:.acetaminophen, calcium carbonate, chlorpheniramine-HYDROcodone, guaiFENesin-dextromethorphan, loperamide, ondansetron **OR** ondansetron (ZOFRAN) IV, oxyCODONE, polyethylene glycol, zolpidem    Time Spent in minutes   10 minutes   Lala Lund M.D on 04/20/2019 at 9:09 AM  Between 7am to 7pm - Pager -  [phone]  After 7pm go to [email or web address] - password [email or web address]  And look for the night coverage person covering for me after hours  [facility] Office  [phone]    Subjective:   Gregory Moody today has, No headache, No chest pain, No abdominal pain - No Nausea, No new weakness tingling or numbness, No Cough - SOB.    Objective:   Vitals:   04/20/19 0200 04/20/19 0300 04/20/19 0400 04/20/19 0434  BP: 107/78  (!) 85/62 (!) 82/51  Pulse: 65  64 68  Resp: (!) 9  16 19   Temp:  98 F (36.7 C)    TempSrc:  Oral    SpO2: 100%  99% 97%  Weight:  Height:        Wt Readings from Last 3 Encounters:  04/17/19 58.6 kg  04/13/19 59 kg  04/01/19 59 kg     Intake/Output Summary (Last 24 hours) at 04/20/2019 0909 Last data filed at 04/20/2019 0400 Gross per 24 hour  Intake 113.03 ml  Output 900 ml  Net -786.97 ml    Exam Awake Alert,   No new F.N deficits, Normal affect [place].AT,PERRAL Supple Neck,No JVD, No cervical lymphadenopathy appriciated.  Symmetrical Chest wall movement, Good air movement bilaterally, CTAB RRR,No Gallops,Rubs or new Murmurs, No Parasternal Heave +ve B.Sounds, Abd Soft, Non tender, No organomegaly appriciated, No rebound - guarding or rigidity. No Cyanosis, Clubbing or edema, left knee wound healing well.   Data Review

## 2019-04-23 ENCOUNTER — Emergency Department: Payer: HRSA Program

## 2019-04-23 ENCOUNTER — Encounter: Payer: Self-pay | Admitting: Emergency Medicine

## 2019-04-23 ENCOUNTER — Inpatient Hospital Stay
Admission: EM | Admit: 2019-04-23 | Discharge: 2019-04-24 | DRG: 178 | Disposition: A | Discharge status: Other Acute Inpatient Hospital | Payer: HRSA Program | Attending: Internal Medicine | Admitting: Internal Medicine

## 2019-04-23 DIAGNOSIS — U071 COVID-19: Principal | ICD-10-CM | POA: Diagnosis present

## 2019-04-23 DIAGNOSIS — R112 Nausea with vomiting, unspecified: Secondary | ICD-10-CM | POA: Diagnosis not present

## 2019-04-23 DIAGNOSIS — E860 Dehydration: Secondary | ICD-10-CM | POA: Diagnosis present

## 2019-04-23 DIAGNOSIS — E1143 Type 2 diabetes mellitus with diabetic autonomic (poly)neuropathy: Secondary | ICD-10-CM | POA: Diagnosis not present

## 2019-04-23 DIAGNOSIS — Z833 Family history of diabetes mellitus: Secondary | ICD-10-CM | POA: Diagnosis not present

## 2019-04-23 DIAGNOSIS — I959 Hypotension, unspecified: Secondary | ICD-10-CM | POA: Diagnosis present

## 2019-04-23 DIAGNOSIS — N179 Acute kidney failure, unspecified: Secondary | ICD-10-CM | POA: Diagnosis present

## 2019-04-23 DIAGNOSIS — E1065 Type 1 diabetes mellitus with hyperglycemia: Secondary | ICD-10-CM | POA: Diagnosis present

## 2019-04-23 DIAGNOSIS — Z794 Long term (current) use of insulin: Secondary | ICD-10-CM | POA: Diagnosis not present

## 2019-04-23 DIAGNOSIS — Z7982 Long term (current) use of aspirin: Secondary | ICD-10-CM | POA: Diagnosis not present

## 2019-04-23 DIAGNOSIS — K3184 Gastroparesis: Secondary | ICD-10-CM | POA: Diagnosis present

## 2019-04-23 DIAGNOSIS — E1165 Type 2 diabetes mellitus with hyperglycemia: Secondary | ICD-10-CM | POA: Diagnosis present

## 2019-04-23 DIAGNOSIS — I951 Orthostatic hypotension: Secondary | ICD-10-CM | POA: Diagnosis present

## 2019-04-23 DIAGNOSIS — D638 Anemia in other chronic diseases classified elsewhere: Secondary | ICD-10-CM | POA: Diagnosis not present

## 2019-04-23 DIAGNOSIS — K219 Gastro-esophageal reflux disease without esophagitis: Secondary | ICD-10-CM | POA: Diagnosis present

## 2019-04-23 DIAGNOSIS — E1043 Type 1 diabetes mellitus with diabetic autonomic (poly)neuropathy: Secondary | ICD-10-CM | POA: Diagnosis present

## 2019-04-23 DIAGNOSIS — Z79899 Other long term (current) drug therapy: Secondary | ICD-10-CM

## 2019-04-23 HISTORY — DX: COVID-19: U07.1

## 2019-04-23 LAB — COMPREHENSIVE METABOLIC PANEL
ALT: 15 U/L (ref 0–44)
AST: 15 U/L (ref 15–41)
Albumin: 2.8 g/dL — ABNORMAL LOW (ref 3.5–5.0)
Alkaline Phosphatase: 74 U/L (ref 38–126)
Anion gap: 8 (ref 5–15)
BUN: 17 mg/dL (ref 6–20)
CO2: 29 mmol/L (ref 22–32)
Calcium: 8.1 mg/dL — ABNORMAL LOW (ref 8.9–10.3)
Chloride: 102 mmol/L (ref 98–111)
Creatinine, Ser: 1.32 mg/dL — ABNORMAL HIGH (ref 0.61–1.24)
GFR calc Af Amer: >60 mL/min (ref >60)
GFR calc non Af Amer: >60 mL/min (ref >60)
Glucose, Bld: 202 mg/dL — ABNORMAL HIGH (ref 70–99)
Potassium: 3.6 mmol/L (ref 3.5–5.1)
Sodium: 139 mmol/L (ref 135–145)
Total Bilirubin: 0.8 mg/dL (ref 0.3–1.2)
Total Protein: 6.4 g/dL — ABNORMAL LOW (ref 6.5–8.1)

## 2019-04-23 LAB — CBC
HCT: 25.7 % — ABNORMAL LOW (ref 39.0–52.0)
Hemoglobin: 8.1 g/dL — ABNORMAL LOW (ref 13.0–17.0)
MCH: 30.3 pg (ref 26.0–34.0)
MCHC: 31.5 g/dL (ref 30.0–36.0)
MCV: 96.3 fL (ref 80.0–100.0)
Platelets: 455 10*3/uL — ABNORMAL HIGH (ref 150–400)
RBC: 2.67 MIL/uL — ABNORMAL LOW (ref 4.22–5.81)
RDW: 14.1 % (ref 11.5–15.5)
WBC: 4.2 10*3/uL (ref 4.0–10.5)
nRBC: 0 % (ref 0.0–0.2)

## 2019-04-23 LAB — LACTIC ACID, PLASMA
Lactic Acid, Venous: 1.9 mmol/L (ref 0.5–1.9)
Lactic Acid, Venous: 1.4 mmol/L (ref 0.5–1.9)

## 2019-04-23 LAB — GLUCOSE, CAPILLARY: Glucose-Capillary: 175 mg/dL — ABNORMAL HIGH (ref 70–99)

## 2019-04-23 LAB — LIPASE, BLOOD: Lipase: 54 U/L — ABNORMAL HIGH (ref 11–51)

## 2019-04-23 LAB — SARS CORONAVIRUS 2 BY RT PCR (HOSPITAL ORDER, PERFORMED IN ~~LOC~~ HOSPITAL LAB): SARS Coronavirus 2: POSITIVE — AB

## 2019-04-23 IMAGING — DX PORTABLE CHEST - 1 VIEW
1 series · 1 of 1 positions shown · non-contrast
Comparison: [DATE] and earlier.

CLINICAL DATA: Two-day history of nausea and vomiting. Current
history of diabetes. [YZ] admission on [DATE].

EXAM:
PORTABLE CHEST 1 VIEW

[chest ap]
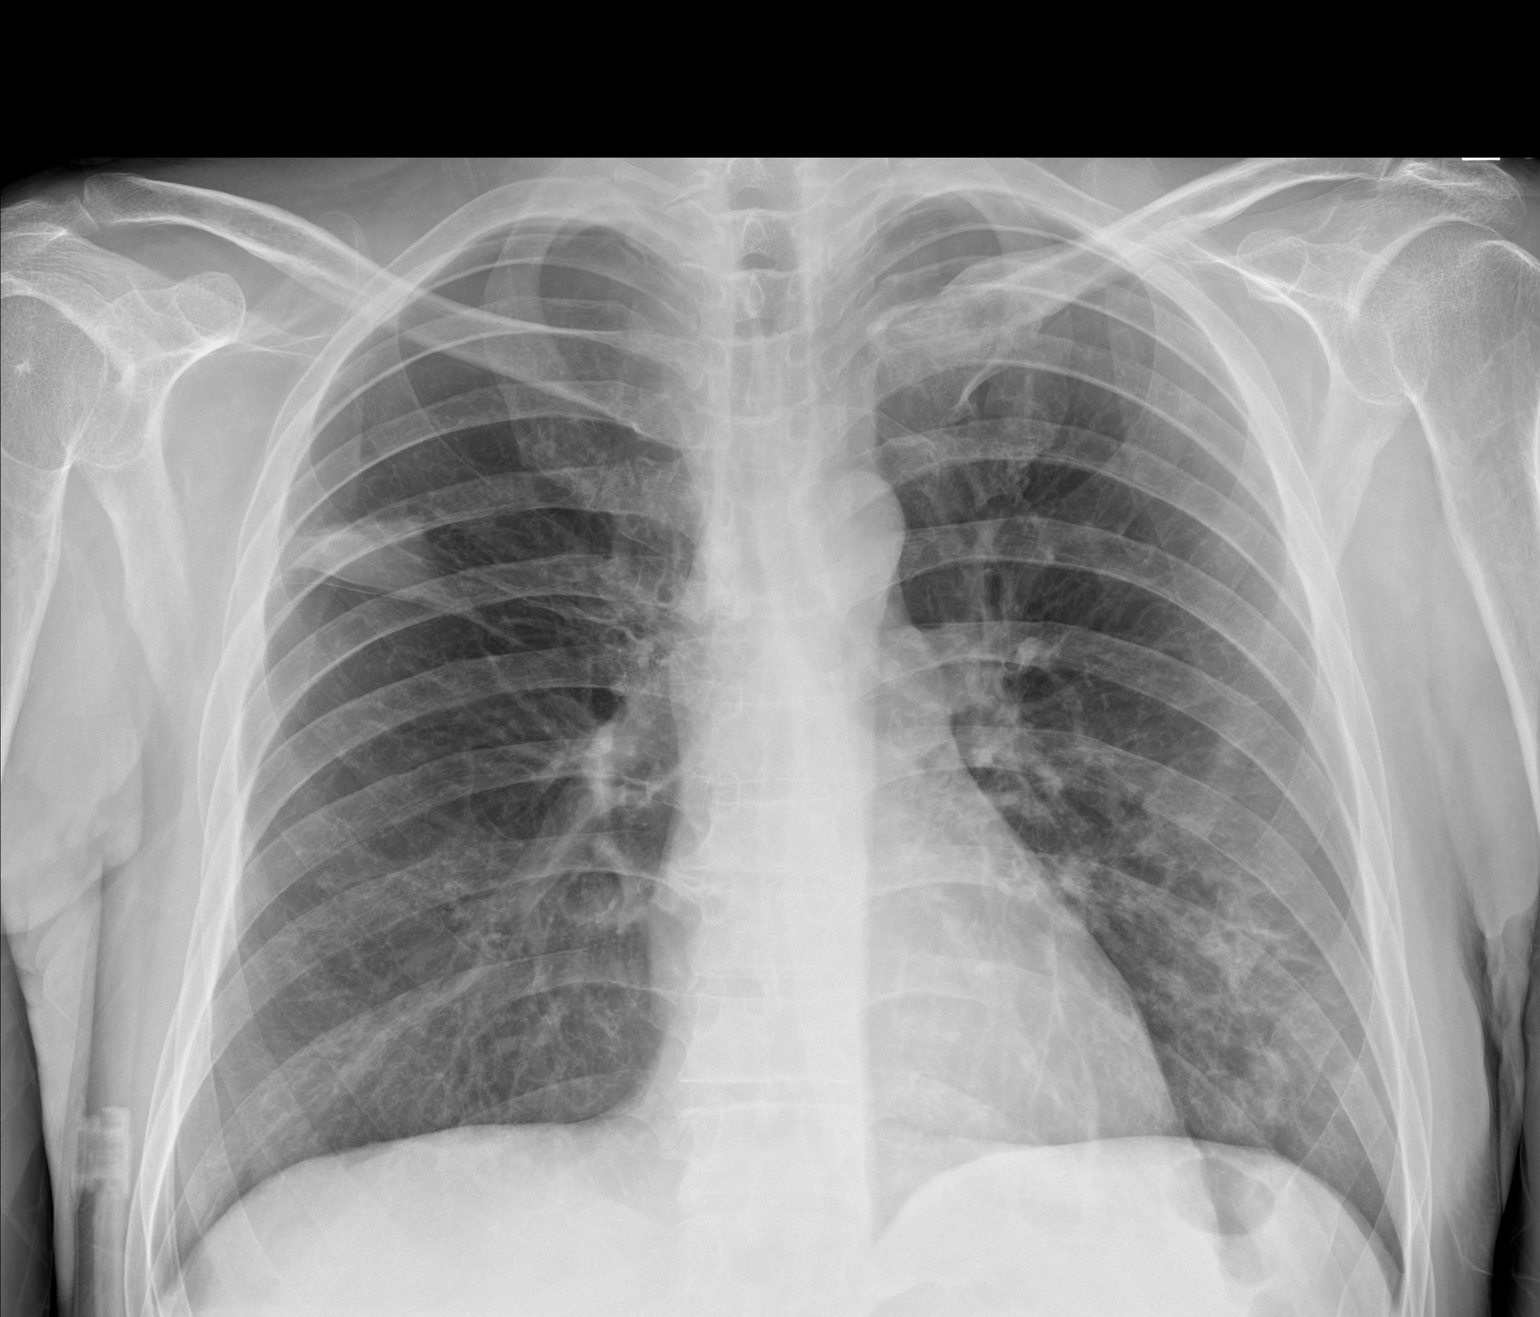

[1 of 1 positions shown; findings below may reference images not displayed]

FINDINGS: Stable ground-glass airspace opacity involving the LATERAL RIGHT
UPPER LOBE. Recurrent patchy opacities in the LEFT mid lung and LEFT
lung base. No confluent airspace consolidation. No visible pleural
effusions.
IMPRESSION: Recurrent patchy bronchopneumonia involving the LEFT mid lung and
LEFT lung base. Stable ground-glass pneumonia involving the RIGHT
UPPER LOBE.

## 2019-04-23 MED ORDER — VANCOMYCIN HCL IN DEXTROSE 1-5 GM/200ML-% IV SOLN
1000.0000 mg | Freq: Once | INTRAVENOUS | Status: AC
  Administered 2019-04-23: 16:00:00 1000 mg via INTRAVENOUS
  Filled 2019-04-23: qty 200

## 2019-04-23 MED ORDER — SODIUM CHLORIDE 0.9 % IV SOLN
2.0000 g | Freq: Once | INTRAVENOUS | Status: AC
  Administered 2019-04-23: 2 g via INTRAVENOUS
  Filled 2019-04-23: qty 2

## 2019-04-23 MED ORDER — ONDANSETRON HCL 4 MG/2ML IJ SOLN
4.0000 mg | Freq: Once | INTRAMUSCULAR | Status: DC
  Filled 2019-04-23: qty 2

## 2019-04-23 MED ORDER — SODIUM CHLORIDE 0.9% FLUSH
3.0000 mL | Freq: Once | INTRAVENOUS | Status: AC
  Administered 2019-04-23: 3 mL via INTRAVENOUS

## 2019-04-23 MED ORDER — SODIUM CHLORIDE 0.9 % IV BOLUS (SEPSIS)
1000.0000 mL | Freq: Once | INTRAVENOUS | Status: AC
  Administered 2019-04-23: 1000 mL via INTRAVENOUS

## 2019-04-23 MED ORDER — SODIUM CHLORIDE 0.9 % IV BOLUS (SEPSIS)
1000.0000 mL | Freq: Once | INTRAVENOUS | Status: AC
  Administered 2019-04-23: 16:00:00 1000 mL via INTRAVENOUS

## 2019-04-23 MED ORDER — METRONIDAZOLE IN NACL 5-0.79 MG/ML-% IV SOLN
500.0000 mg | Freq: Once | INTRAVENOUS | Status: AC
  Administered 2019-04-23: 500 mg via INTRAVENOUS
  Filled 2019-04-23: qty 100

## 2019-04-23 MED ORDER — SODIUM CHLORIDE 0.9 % IV BOLUS
1000.0000 mL | Freq: Once | INTRAVENOUS | Status: AC
  Administered 2019-04-23: 1000 mL via INTRAVENOUS

## 2019-04-23 MED ORDER — LIDOCAINE VISCOUS HCL 2 % MT SOLN
15.0000 mL | Freq: Once | OROMUCOSAL | Status: DC
  Filled 2019-04-23: qty 15

## 2019-04-23 MED ORDER — ALUM & MAG HYDROXIDE-SIMETH 200-200-20 MG/5ML PO SUSP
30.0000 mL | Freq: Once | ORAL | Status: DC
  Filled 2019-04-23: qty 30

## 2019-04-23 NOTE — ED Notes (Signed)
ED TO INPATIENT HANDOFF REPORT  ED Nurse Name and Phone #: Melesa Lecy [phone]  S Name/Age/Gender Gregory Moody 43 y.o. male Room/Bed: ED15A/ED15A  Code Status   Code Status: Prior  Home/SNF/Other Home Patient oriented to: self, place, time and situation Is this baseline? Yes   Triage Complete: Triage complete  Chief Complaint vomiting  Triage Note Pt c/o emesis x2 days with no appetite. PT hx of diabetes. PT has + lang barrier . Recent covid + admission    Allergies No Known Allergies  Level of Care/Admitting Diagnosis ED Disposition    ED Disposition Condition [facility] Area: [place] [[id number]]  Level of Care: Telemetry [5]  Covid Evaluation: Confirmed COVID Positive  Diagnosis: Nausea and vomiting [017510]  Admitting Physician: Lance Coon [[id number]]  Attending Physician: Lance Coon [phone]  Estimated length of stay: past midnight tomorrow  Certification:: I certify this patient will need inpatient services for at least 2 midnights  Bed request comments: 2a  PT Class (Do Not Modify): Inpatient [101]  PT Acc Code (Do Not Modify): Private [1]       B Medical/Surgery History Past Medical History:  Diagnosis Date  . COVID-19   . Diabetes mellitus without complication ([place])   . Gastroparesis   . Tuberculosis    Past Surgical History:  Procedure Laterality Date  . ESOPHAGOGASTRODUODENOSCOPY N/A 02/03/2019   Procedure: ESOPHAGOGASTRODUODENOSCOPY (EGD);  Surgeon: Toledo, Benay Pike, MD;  Location: [facility] ENDOSCOPY;  Service: Gastroenterology;  Laterality: N/A;     A IV Location/Drains/Wounds Patient Lines/Drains/Airways Status   Active Line/Drains/Airways    Name:   Placement date:   Placement time:   Site:   Days:   Peripheral IV 04/13/19 Left Antecubital   04/13/19    2137    Antecubital   10   Peripheral IV 04/13/19 Left Arm   04/13/19    2140    Arm   10   Peripheral IV 04/23/19 Right Antecubital   04/23/19     1520    Antecubital   less than 1   Peripheral IV 04/23/19 Left Hand   04/23/19    1609    Hand   less than 1   Pressure Injury 03/31/19 Knee Anterior;Left Stage II -  Partial thickness loss of dermis presenting as a shallow open ulcer with a red, pink wound bed without slough.   03/31/19    0254     23   Wound / Incision (Open or Dehisced) 04/02/19 Diabetic ulcer   04/02/19    0530    -   21   Wound / Incision (Open or Dehisced) 04/14/19 Other (Comment) Hand Right   04/14/19    1119    Hand   9          Intake/Output Last 24 hours  Intake/Output Summary (Last 24 hours) at 04/23/2019 2200 Last data filed at 04/23/2019 1726 Gross per 24 hour  Intake 2000 ml  Output -  Net 2000 ml    Labs/Imaging Results for orders placed or performed during the hospital encounter of 04/23/19 (from the past 48 hour(s))  Glucose, capillary     Status: Abnormal   Collection Time: 04/23/19  3:18 PM  Result Value Ref Range   Glucose-Capillary 175 (H) 70 - 99 mg/dL  Lipase, blood     Status: Abnormal   Collection Time: 04/23/19  3:20 PM  Result Value Ref Range   Lipase 54 (H) 11 - 51 U/L  Comment: Performed at [facility], [address] [place], [place] [address]  Comprehensive metabolic panel     Status: Abnormal   Collection Time: 04/23/19  3:20 PM  Result Value Ref Range   Sodium 139 135 - 145 mmol/L   Potassium 3.6 3.5 - 5.1 mmol/L   Chloride 102 98 - 111 mmol/L   CO2 29 22 - 32 mmol/L   Glucose, Bld 202 (H) 70 - 99 mg/dL   BUN 17 6 - 20 mg/dL   Creatinine, Ser 1.32 (H) 0.61 - 1.24 mg/dL   Calcium 8.1 (L) 8.9 - 10.3 mg/dL   Total Protein 6.4 (L) 6.5 - 8.1 g/dL   Albumin 2.8 (L) 3.5 - 5.0 g/dL   AST 15 15 - 41 U/L   ALT 15 0 - 44 U/L   Alkaline Phosphatase 74 38 - 126 U/L   Total Bilirubin 0.8 0.3 - 1.2 mg/dL   GFR calc non Af Amer >60 >60 mL/min   GFR calc Af Amer >60 >60 mL/min   Anion gap 8 5 - 15    Comment: Performed at [facility], [address]  [place], [place] [address]  CBC     Status: Abnormal   Collection Time: 04/23/19  3:20 PM  Result Value Ref Range   WBC 4.2 4.0 - 10.5 K/uL   RBC 2.67 (L) 4.22 - 5.81 MIL/uL   Hemoglobin 8.1 (L) 13.0 - 17.0 g/dL   HCT 25.7 (L) 39.0 - 52.0 %   MCV 96.3 80.0 - 100.0 fL   MCH 30.3 26.0 - 34.0 pg   MCHC 31.5 30.0 - 36.0 g/dL   RDW 14.1 11.5 - 15.5 %   Platelets 455 (H) 150 - 400 K/uL   nRBC 0.0 0.0 - 0.2 %    Comment: Performed at [facility], [address] [place], [place] [address]  Lactic acid, plasma     Status: None   Collection Time: 04/23/19  3:34 PM  Result Value Ref Range   Lactic Acid, Venous 1.9 0.5 - 1.9 mmol/L    Comment: Performed at [facility], [address] [place], [place] [address]  Blood gas, venous     Status: Abnormal (Preliminary result)   Collection Time: 04/23/19  3:35 PM  Result Value Ref Range   pH, Ven 7.41 7.250 - 7.430   pCO2, Ven 49 44.0 - 60.0 mmHg   pO2, Ven PENDING 32.0 - 45.0 mmHg   Bicarbonate 31.1 (H) 20.0 - 28.0 mmol/L   Acid-Base Excess 5.3 (H) 0.0 - 2.0 mmol/L   O2 Saturation 27.3 %   Patient temperature 37.0    Collection site VEIN    Sample type VEIN     Comment: Performed at [facility], [address] [place], [place] [address]  SARS Coronavirus 2 (CEPHEID - Performed in [place] hospital lab), Hosp Order     Status: Abnormal   Collection Time: 04/23/19  4:08 PM   Specimen: Nasopharyngeal Swab  Result Value Ref Range   SARS Coronavirus 2 POSITIVE (A) NEGATIVE    Comment: RESULT CALLED TO, READ BACK BY AND VERIFIED WITH: Guilford Shannahan @1738  04/23/19 AKT (NOTE) If result is NEGATIVE SARS-CoV-2 target nucleic acids are NOT DETECTED. The SARS-CoV-2 RNA is generally detectable in upper and lower  respiratory specimens during the acute phase of infection. The lowest  concentration of SARS-CoV-2 viral copies this assay can detect is 250  copies / mL. A negative result does not  preclude SARS-CoV-2  infection  and should not be used as the sole basis for treatment or other  patient management decisions.  A negative result may occur with  improper specimen collection / handling, submission of specimen other  than nasopharyngeal swab, presence of viral mutation(s) within the  areas targeted by this assay, and inadequate number of viral copies  (<250 copies / mL). A negative result must be combined with clinical  observations, patient history, and epidemiological information. If result is POSITIVE SARS-CoV-2 target nucleic acids are DETECTED. The S ARS-CoV-2 RNA is generally detectable in upper and lower  respiratory specimens during the acute phase of infection.  Positive  results are indicative of active infection with SARS-CoV-2.  Clinical  correlation with patient history and other diagnostic information is  necessary to determine patient infection status.  Positive results do  not rule out bacterial infection or co-infection with other viruses. If result is PRESUMPTIVE POSTIVE SARS-CoV-2 nucleic acids MAY BE PRESENT.   A presumptive positive result was obtained on the submitted specimen  and confirmed on repeat testing.  While 2019 novel coronavirus  (SARS-CoV-2) nucleic acids may be present in the submitted sample  additional confirmatory testing may be necessary for epidemiological  and / or clinical management purposes  to differentiate between  SARS-CoV-2 and other Sarbecovirus currently known to infect humans.  If clinically indicated additional testing with an alternate test  methodology [phone]) is adv ised. The SARS-CoV-2 RNA is generally  detectable in upper and lower respiratory specimens during the acute  phase of infection. The expected result is Negative. Fact Sheet for Patients:  StrictlyIdeas.no Fact Sheet for Healthcare Providers: BankingDealers.co.za This test is not yet approved or cleared by the [place]  FDA and has been authorized for detection and/or diagnosis of SARS-CoV-2 by FDA under an Emergency Use Authorization (EUA).  This EUA will remain in effect (meaning this test can be used) for the duration of the COVID-19 declaration under Section 564(b)(1) of the Act, 21 U.S.C. section 360bbb-3(b)(1), unless the authorization is terminated or revoked sooner. Performed at [facility], [address] [place], [place] [address]   Lactic acid, plasma     Status: None   Collection Time: 04/23/19  5:34 PM  Result Value Ref Range   Lactic Acid, Venous 1.4 0.5 - 1.9 mmol/L    Comment: Performed at [facility], [address] [place], [place] [address]   Dg Chest Port 1 View  Result Date: 04/23/2019 CLINICAL DATA:  Two-day history of nausea and vomiting. Current history of diabetes. COVID-19 admission on 03/31/2019. EXAM: PORTABLE CHEST 1 VIEW COMPARISON:  04/16/2019 and earlier. FINDINGS: Stable ground-glass airspace opacity involving the LATERAL RIGHT UPPER LOBE. Recurrent patchy opacities in the LEFT mid lung and LEFT lung base. No confluent airspace consolidation. No visible pleural effusions. IMPRESSION: Recurrent patchy bronchopneumonia involving the LEFT mid lung and LEFT lung base. Stable ground-glass pneumonia involving the RIGHT UPPER LOBE. Electronically Signed   By: Evangeline Dakin M.D.   On: 04/23/2019 15:58    Pending Labs Unresulted Labs (From admission, onward)    Start     Ordered   04/23/19 1534  Protime-INR  ONCE - STAT,   STAT     04/23/19 1534   04/23/19 1534  Blood Culture (routine x 2)  BLOOD CULTURE X 2,   STAT     04/23/19 1534   04/23/19 1534  Urinalysis, Routine w reflex microscopic  ONCE - STAT,   STAT  04/23/19 1534   04/23/19 1534  Urine culture  ONCE - STAT,   STAT     04/23/19 1534   04/23/19 1520  Urinalysis, Complete w Microscopic  ONCE - STAT,   STAT     04/23/19 1519   Signed and Held  CBC  (enoxaparin (LOVENOX)    CrCl >/= 30  ml/min)  Once,   R    Comments: Baseline for enoxaparin therapy IF NOT ALREADY DRAWN.  Notify MD if PLT < 100 K.    Signed and Held   Signed and Held  Creatinine, serum  (enoxaparin (LOVENOX)    CrCl >/= 30 ml/min)  Once,   R    Comments: Baseline for enoxaparin therapy IF NOT ALREADY DRAWN.    Signed and Held   Signed and Held  Creatinine, serum  (enoxaparin (LOVENOX)    CrCl >/= 30 ml/min)  Weekly,   R    Comments: while on enoxaparin therapy    Signed and Held   Signed and Held  Basic metabolic panel  Tomorrow morning,   R     Signed and Held   Signed and Held  CBC  Tomorrow morning,   R     Signed and Held          Vitals/Pain Today's Vitals   04/23/19 1630 04/23/19 1730 04/23/19 2155 04/23/19 2200  BP: 124/84 121/80 (!) 76/55 (!) 88/66  Pulse: 70 71 73   Resp: 14 12 18    SpO2: 100% 96% 100%   PainSc:        Isolation Precautions No active isolations  Medications Medications  ondansetron (ZOFRAN) injection 4 mg (4 mg Intravenous Not Given 04/23/19 1640)  alum & mag hydroxide-simeth (MAALOX/MYLANTA) 200-200-20 MG/5ML suspension 30 mL (0 mLs Oral Hold 04/23/19 2159)    And  lidocaine (XYLOCAINE) 2 % viscous mouth solution 15 mL (0 mLs Oral Hold 04/23/19 2200)  sodium chloride flush (NS) 0.9 % injection 3 mL (3 mLs Intravenous Given 04/23/19 1726)  ceFEPIme (MAXIPIME) 2 g in sodium chloride 0.9 % 100 mL IVPB (0 g Intravenous Stopped 04/23/19 1726)  metroNIDAZOLE (FLAGYL) IVPB 500 mg (0 mg Intravenous Stopped 04/23/19 2156)  vancomycin (VANCOCIN) IVPB 1000 mg/200 mL premix (0 mg Intravenous Stopped 04/23/19 1726)  sodium chloride 0.9 % bolus 1,000 mL (0 mLs Intravenous Stopped 04/23/19 1726)    And  sodium chloride 0.9 % bolus 1,000 mL (0 mLs Intravenous Stopped 04/23/19 1726)  sodium chloride 0.9 % bolus 1,000 mL (1,000 mLs Intravenous New Bag/Given 04/23/19 2158)    Mobility walks High fall risk   Focused Assessments Cardiac Assessment Handoff:  Cardiac Rhythm: Normal  sinus rhythm Lab Results  Component Value Date   CKTOTAL 79 04/17/2019   TROPONINI <0.03 03/06/2019   Lab Results  Component Value Date   DDIMER 1.66 (H) 04/20/2019   Does the Patient currently have chest pain? No     R Recommendations: See Admitting Provider Note  Report given to:   Additional Notes: pt speaks spanish. Denies any complaints but looks uncomfortable

## 2019-04-23 NOTE — Progress Notes (Signed)
PHARMACY -  BRIEF ANTIBIOTIC NOTE   Pharmacy has received consult(s) for vancomycin and cefepime from an ED provider.  The patient's profile has been reviewed for ht/wt/allergies/indication/available labs.    One time order(s) placed for cefepime 2 g + vanc 1 g  Further antibiotics/pharmacy consults should be ordered by admitting physician if indicated.                       Thank you,  Tawnya Crook, PharmD Clinical Pharmacist 04/23/2019  4:32 PM

## 2019-04-23 NOTE — ED Notes (Signed)
PT given crackers, blankets, and pillow

## 2019-04-23 NOTE — ED Triage Notes (Addendum)
Pt c/o emesis x2 days with no appetite. PT hx of diabetes. PT has + lang barrier . Recent covid + admission

## 2019-04-23 NOTE — Progress Notes (Signed)
CODE SEPSIS - PHARMACY COMMUNICATION  **Broad Spectrum Antibiotics should be administered within 1 hour of Sepsis diagnosis**  Time Code Sepsis Called/Page Received: 1551  Antibiotics Ordered: vanc/cefepime/metronidazole  Time of 1st antibiotic administration: 1612  Additional action taken by pharmacy: NA  If necessary, Name of Provider/Nurse Contacted: NA   Tawnya Crook ,PharmD Clinical Pharmacist  04/23/2019  4:29 PM

## 2019-04-23 NOTE — ED Provider Notes (Signed)
[facility] Emergency Department Provider Note  ____________________________________________   First MD Initiated Contact with Patient 04/23/19 1526     (approximate)  I have reviewed the triage vital signs and the nursing notes.   HISTORY  Chief Complaint Emesis    HPI Gregory Moody is a 43 y.o. male  With PMHx T1DM, gastroparesis, here with nausea, vomiting. Pt has complex recent history involving hospitalization @ [facility] for COVID last week. He states he initially felt better but over the past 2 days, has had recurrence of nausea, vomiting, and epigastric abd pain. Pt has a h/o similar sx in past and admits he has been eating more food than usual since getting out of the hospital. He feels like his SOB is improving and he denies any increased cough or sputum production. No alleviating factors. Sx are worse w/ any attempted PO intake. No fevers, chills. No other complaints. He has not been checking his blood sugar @ home because he states he couldn't keep anything down.     Past Medical History:  Diagnosis Date   COVID-19    Diabetes mellitus without complication ([place])    Gastroparesis    Tuberculosis     Patient Active Problem List   Diagnosis Date Noted   Hypotension 04/23/2019   CAP (community acquired pneumonia) due to MSSA (methicillin sensitive Staphylococcus aureus) ([place]) 04/14/2019   COVID-19 virus infection 04/14/2019   Nausea and vomiting 04/02/2019   Nausea vomiting and diarrhea    Pressure injury of skin 03/31/2019   Acute renal failure (ARF) ([place]) 03/06/2019   CAP (community acquired pneumonia) 02/14/2019   AKI (acute kidney injury) ([place]) 02/02/2019   ARF (acute renal failure) ([place]) 01/03/2019   Malnutrition of moderate degree 02/24/2018   GERD (gastroesophageal reflux disease) 02/23/2018   Diabetic gastroparesis ([place]) 02/23/2018   Diabetic foot ulcer ([place]) 02/23/2018   Aspiration pneumonia ([place]) 04/21/2017    Hypoglycemia 04/21/2017   Diabetes mellitus with hyperglycemia ([place]) 04/21/2017   Hip fracture, unspecified laterality, closed, initial encounter ([place]) 04/17/2017   Hyperglycemia 04/17/2017   Protein-calorie malnutrition, severe 04/09/2016   Cavitary lesion of lung 04/06/2016    Past Surgical History:  Procedure Laterality Date   ESOPHAGOGASTRODUODENOSCOPY N/A 02/03/2019   Procedure: ESOPHAGOGASTRODUODENOSCOPY (EGD);  Surgeon: Toledo, Benay Pike, MD;  Location: [facility] ENDOSCOPY;  Service: Gastroenterology;  Laterality: N/A;    Prior to Admission medications   Medication Sig Start Date End Date Taking? Authorizing Provider  aspirin EC 81 MG tablet Take 81 mg by mouth daily.    [provider]  doxycycline (VIBRAMYCIN) 100 MG capsule Take 1 capsule (100 mg total) by mouth 2 (two) times daily. 04/19/19   [name], MD  insulin NPH-regular Human (70-30) 100 UNIT/ML injection Inject 20 Units into the skin 2 (two) times daily with a meal. 25 units with breakfast in am and 35 units with supper 04/03/19   [name], MD  loperamide (IMODIUM A-D) 2 MG tablet Take 1 tablet (2 mg total) by mouth 4 (four) times daily as needed for diarrhea or loose stools. 04/19/19   [name], MD  ondansetron (ZOFRAN) 4 MG tablet Take 1 tablet (4 mg total) by mouth every 6 (six) hours as needed for nausea. 04/03/19   [name], MD  pantoprazole (PROTONIX) 40 MG tablet Take 1 tablet (40 mg total) by mouth 2 (two) times daily before a meal. 04/03/19   [name], MD    Allergies Patient has no known allergies.  Family History  Problem Relation Age of Onset   Diabetes Mother    Diabetes Father     Social History Social History   Tobacco Use   Smoking status: Never Smoker   Smokeless tobacco: Never Used  Substance Use Topics   Alcohol use: No   Drug use: No    Review of Systems  Review of Systems  Constitutional: Positive for fatigue.  Negative for chills and fever.  HENT: Negative for sore throat.   Respiratory: Negative for shortness of breath.   Cardiovascular: Negative for chest pain.  Gastrointestinal: Positive for abdominal pain, nausea and vomiting.  Genitourinary: Negative for flank pain.  Musculoskeletal: Negative for neck pain.  Skin: Negative for rash and wound.  Allergic/Immunologic: Negative for immunocompromised state.  Neurological: Positive for weakness. Negative for numbness.  Hematological: Does not bruise/bleed easily.     ____________________________________________  PHYSICAL EXAM:      VITAL SIGNS: ED Triage Vitals  Enc Vitals Group     BP 04/23/19 1509 (!) 65/36     Pulse Rate 04/23/19 1509 87     Resp 04/23/19 1509 14     Temp --      Temp src --      SpO2 04/23/19 1509 99 %     Weight --      Height --      Head Circumference --      Peak Flow --      Pain Score 04/23/19 1510 7     Pain Loc --      Pain Edu? --      Excl. in [place]? --      Physical Exam Vitals signs and nursing note reviewed.  Constitutional:      General: He is not in acute distress.    Appearance: He is well-developed.  HENT:     Head: Normocephalic and atraumatic.     Mouth/Throat:     Mouth: Mucous membranes are dry.  Eyes:     Conjunctiva/sclera: Conjunctivae normal.  Neck:     Musculoskeletal: Neck supple.  Cardiovascular:     Rate and Rhythm: Normal rate and regular rhythm.     Heart sounds: Normal heart sounds. No murmur. No friction rub.  Pulmonary:     Effort: Pulmonary effort is normal. No respiratory distress.     Breath sounds: Normal breath sounds. No wheezing or rales.  Abdominal:     General: There is no distension.     Palpations: Abdomen is soft.     Tenderness: There is no abdominal tenderness.  Skin:    General: Skin is warm.     Capillary Refill: Capillary refill takes less than 2 seconds.  Neurological:     Mental Status: He is alert and oriented to person, place, and time.       Motor: No abnormal muscle tone.       ____________________________________________   LABS (all labs ordered are listed, but only abnormal results are displayed)  Labs Reviewed  SARS CORONAVIRUS 2 (HOSPITAL ORDER, [place] [facility]) - Abnormal; Notable for the following components:      Result Value   SARS Coronavirus 2 POSITIVE (*)    All other components within normal limits  LIPASE, BLOOD - Abnormal; Notable for the following components:   Lipase 54 (*)    All other components within normal limits  COMPREHENSIVE METABOLIC PANEL - Abnormal; Notable for the following components:   Glucose, Bld 202 (*)  Creatinine, Ser 1.32 (*)    Calcium 8.1 (*)    Total Protein 6.4 (*)    Albumin 2.8 (*)    All other components within normal limits  CBC - Abnormal; Notable for the following components:   RBC 2.67 (*)    Hemoglobin 8.1 (*)    HCT 25.7 (*)    Platelets 455 (*)    All other components within normal limits  GLUCOSE, CAPILLARY - Abnormal; Notable for the following components:   Glucose-Capillary 175 (*)    All other components within normal limits  BLOOD GAS, VENOUS - Abnormal; Notable for the following components:   Bicarbonate 31.1 (*)    Acid-Base Excess 5.3 (*)    All other components within normal limits  CULTURE, BLOOD (ROUTINE X 2)  CULTURE, BLOOD (ROUTINE X 2)  URINE CULTURE  LACTIC ACID, PLASMA  LACTIC ACID, PLASMA  URINALYSIS, COMPLETE (UACMP) WITH MICROSCOPIC  PROTIME-INR  URINALYSIS, ROUTINE W REFLEX MICROSCOPIC    ____________________________________________  EKG: None ________________________________________  RADIOLOGY All imaging, including plain films, CT scans, and ultrasounds, independently reviewed by me, and interpretations confirmed via formal radiology reads.  ED MD interpretation:   Chest x-ray: Recurrent bronchopneumonia  Official radiology report(s): Dg Chest Port 1 View  Result Date: 04/23/2019 CLINICAL  DATA:  Two-day history of nausea and vomiting. Current history of diabetes. COVID-19 admission on 03/31/2019. EXAM: PORTABLE CHEST 1 VIEW COMPARISON:  04/16/2019 and earlier. FINDINGS: Stable ground-glass airspace opacity involving the LATERAL RIGHT UPPER LOBE. Recurrent patchy opacities in the LEFT mid lung and LEFT lung base. No confluent airspace consolidation. No visible pleural effusions. IMPRESSION: Recurrent patchy bronchopneumonia involving the LEFT mid lung and LEFT lung base. Stable ground-glass pneumonia involving the RIGHT UPPER LOBE. Electronically Signed   By: Evangeline Dakin M.D.   On: 04/23/2019 15:58    ____________________________________________  PROCEDURES   Procedure(s) performed (including Critical Care):  .Critical Care Performed by: Duffy Bruce, MD Authorized by: Duffy Bruce, MD   Critical care provider statement:    Critical care time (minutes):  35   Critical care time was exclusive of:  Separately billable procedures and treating other patients and teaching time   Critical care was necessary to treat or prevent imminent or life-threatening deterioration of the following conditions:  Circulatory failure, cardiac failure, metabolic crisis and sepsis   Critical care was time spent personally by me on the following activities:  Development of treatment plan with patient or surrogate, discussions with consultants, evaluation of patient's response to treatment, examination of patient, obtaining history from patient or surrogate, ordering and performing treatments and interventions, ordering and review of laboratory studies, ordering and review of radiographic studies, pulse oximetry, re-evaluation of patient's condition and review of old charts   I assumed direction of critical care for this patient from another provider in my specialty: no      ____________________________________________  INITIAL IMPRESSION / MDM / [place] / ED COURSE  As part of  my medical decision making, I reviewed the following data within the electronic MEDICAL RECORD NUMBER Notes from prior ED visits and [place] Controlled Substance Database      *Christerpher Clos was evaluated in Emergency Department on 04/23/2019 for the symptoms described in the history of present illness. He was evaluated in the context of the global COVID-19 pandemic, which necessitated consideration that the patient might be at risk for infection with the SARS-CoV-2 virus that causes COVID-19. Institutional protocols and algorithms that pertain to the evaluation of  patients at risk for COVID-19 are in a state of rapid change based on information released by regulatory bodies including the CDC and federal and state organizations. These policies and algorithms were followed during the patient's care in the ED.  Some ED evaluations and interventions may be delayed as a result of limited staffing during the pandemic.*      Medical Decision Making: 43 year old male here with nausea, vomiting, hypertension.  Patient was just hospitalized with coronavirus.  Differential includes vomiting secondary to bronchopneumonia related to coronavirus, possibly with bacterial superinfection, versus gastroparesis with recurrent dehydration.  Patient hypotensive to the 94M systolic on arrival.  Code sepsis initiated with broad-spectrum antibiotics and fluids.  Fortunately, his lab work is overall reassuring.  Chest x-ray does show persistent pneumonia but his white blood cell count and lactic acid are normal, making acute bacterial infection less likely.  However, the patient has persistent diarrhea and nausea in the ED despite fluids, and remains intermittently hypotensive to the 60s.  Given his complex history with brittle diabetes and recurrent hypotension, will admit for observation.  Unfortunately, [facility] is full at the time, so will admit to 2a.  ____________________________________________  FINAL CLINICAL  IMPRESSION(S) / ED DIAGNOSES  Final diagnoses:  Dehydration  Hypotension, unspecified hypotension type  COVID-19     MEDICATIONS GIVEN DURING THIS VISIT:  Medications  ondansetron (ZOFRAN) injection 4 mg (4 mg Intravenous Not Given 04/23/19 1640)  alum & mag hydroxide-simeth (MAALOX/MYLANTA) 200-200-20 MG/5ML suspension 30 mL (0 mLs Oral Hold 04/23/19 2159)    And  lidocaine (XYLOCAINE) 2 % viscous mouth solution 15 mL (0 mLs Oral Hold 04/23/19 2200)  sodium chloride flush (NS) 0.9 % injection 3 mL (3 mLs Intravenous Given 04/23/19 1726)  ceFEPIme (MAXIPIME) 2 g in sodium chloride 0.9 % 100 mL IVPB (0 g Intravenous Stopped 04/23/19 1726)  metroNIDAZOLE (FLAGYL) IVPB 500 mg (0 mg Intravenous Stopped 04/23/19 2156)  vancomycin (VANCOCIN) IVPB 1000 mg/200 mL premix (0 mg Intravenous Stopped 04/23/19 1726)  sodium chloride 0.9 % bolus 1,000 mL (0 mLs Intravenous Stopped 04/23/19 1726)    And  sodium chloride 0.9 % bolus 1,000 mL (0 mLs Intravenous Stopped 04/23/19 1726)  sodium chloride 0.9 % bolus 1,000 mL (1,000 mLs Intravenous New Bag/Given 04/23/19 2158)     ED Discharge Orders    None       Note:  This document was prepared using Dragon voice recognition software and may include unintentional dictation errors.   Duffy Bruce, MD 04/23/19 2300

## 2019-04-23 NOTE — ED Notes (Signed)
Pt given sandwich tray and diet soda

## 2019-04-23 NOTE — ED Notes (Signed)
PT states he is hungry. MD has been made aware and said pt can have water. PT provided water and informed of why he can have only water. Encouraged pt to take small sips and if tolerated, will recheck with MD about food.

## 2019-04-23 NOTE — H&P (Signed)
[place] [facility] [place] NAME: Gregory Moody    MR#:  [id number]  DATE OF BIRTH:  08/13/1976  DATE OF ADMISSION:  04/23/2019  PRIMARY CARE PHYSICIAN: [place]   REQUESTING/REFERRING PHYSICIAN: Lysbeth Galas, MD  CHIEF COMPLAINT:   Chief Complaint  Patient presents with  . Emesis    HISTORY OF PRESENT ILLNESS:  Gregory Moody  is a 43 y.o. male who presents with chief complaint as above.  Patient presents the ED with a complaint of several days of nausea with vomiting.  Gregory Moody was diagnosed with coronavirus a few days ago, and is again positive on testing here tonight.  Gregory Moody was initially hypotensive, with his blood pressure responding fairly well to IV fluids.  Gregory Moody does have a history of diabetes as well, gastroparesis.  Gregory Moody is not in DKA at this time.  Hospitalist were called for admission  PAST MEDICAL HISTORY:   Past Medical History:  Diagnosis Date  . COVID-19   . Diabetes mellitus without complication ([place])   . Gastroparesis   . Tuberculosis      PAST SURGICAL HISTORY:   Past Surgical History:  Procedure Laterality Date  . ESOPHAGOGASTRODUODENOSCOPY N/A 02/03/2019   Procedure: ESOPHAGOGASTRODUODENOSCOPY (EGD);  Surgeon: Toledo, Benay Pike, MD;  Location: [facility] ENDOSCOPY;  Service: Gastroenterology;  Laterality: N/A;     SOCIAL HISTORY:   Social History   Tobacco Use  . Smoking status: Never Smoker  . Smokeless tobacco: Never Used  Substance Use Topics  . Alcohol use: No     FAMILY HISTORY:   Family History  Problem Relation Age of Onset  . Diabetes Mother   . Diabetes Father      DRUG ALLERGIES:  No Known Allergies  MEDICATIONS AT HOME:   Prior to Admission medications   Medication Sig Start Date End Date Taking? Authorizing Provider  aspirin EC 81 MG tablet Take 81 mg by mouth daily.    [provider]  doxycycline (VIBRAMYCIN) 100 MG capsule Take 1 capsule (100 mg  total) by mouth 2 (two) times daily. 04/19/19   [name], MD  insulin NPH-regular Human (70-30) 100 UNIT/ML injection Inject 20 Units into the skin 2 (two) times daily with a meal. 25 units with breakfast in am and 35 units with supper 04/03/19   [name], MD  loperamide (IMODIUM A-D) 2 MG tablet Take 1 tablet (2 mg total) by mouth 4 (four) times daily as needed for diarrhea or loose stools. 04/19/19   [name], MD  ondansetron (ZOFRAN) 4 MG tablet Take 1 tablet (4 mg total) by mouth every 6 (six) hours as needed for nausea. 04/03/19   [name], MD  pantoprazole (PROTONIX) 40 MG tablet Take 1 tablet (40 mg total) by mouth 2 (two) times daily before a meal. 04/03/19   [name], MD    REVIEW OF SYSTEMS:  Review of Systems  Constitutional: Positive for malaise/fatigue. Negative for chills, fever and weight loss.  HENT: Negative for ear pain, hearing loss and tinnitus.   Eyes: Negative for blurred vision, double vision, pain and redness.  Respiratory: Negative for cough, hemoptysis and shortness of breath.   Cardiovascular: Negative for chest pain, palpitations, orthopnea and leg swelling.  Gastrointestinal: Positive for nausea and vomiting. Negative for abdominal pain, constipation and diarrhea.  Genitourinary: Negative for dysuria, frequency and hematuria.  Musculoskeletal: Negative for back pain, joint pain and neck pain.  Skin:  No acne, rash, or lesions  Neurological: Negative for dizziness, tremors, focal weakness and weakness.  Endo/Heme/Allergies: Negative for polydipsia. Does not bruise/bleed easily.  Psychiatric/Behavioral: Negative for depression. The patient is not nervous/anxious and does not have insomnia.      VITAL SIGNS:   Vitals:   04/23/19 1509 04/23/19 1630 04/23/19 1730 04/23/19 2155  BP: (!) 65/36 124/84 121/80 (!) 76/55  Pulse: 87 70 71 73  Resp: 14 14 12 18   SpO2: 99% 100% 96% 100%   Wt Readings from Last 3  Encounters:  04/17/19 58.6 kg  04/13/19 59 kg  04/01/19 59 kg    PHYSICAL EXAMINATION:  Physical Exam  Vitals reviewed. Constitutional: Gregory Moody is oriented to person, place, and time. Gregory Moody appears well-developed and well-nourished. No distress.  HENT:  Head: Normocephalic and atraumatic.  Mouth/Throat: Oropharynx is clear and moist.  Eyes: Pupils are equal, round, and reactive to light. Conjunctivae and EOM are normal. No scleral icterus.  Neck: Normal range of motion. Neck supple. No JVD present. No thyromegaly present.  Cardiovascular: Normal rate, regular rhythm and intact distal pulses. Exam reveals no gallop and no friction rub.  No murmur heard. Respiratory: Effort normal. No respiratory distress. Gregory Moody has no wheezes. Gregory Moody has rales.  GI: Soft. Bowel sounds are normal. Gregory Moody exhibits no distension. There is abdominal tenderness.  Musculoskeletal: Normal range of motion.        General: No edema.     Comments: No arthritis, no gout  Lymphadenopathy:    Gregory Moody has no cervical adenopathy.  Neurological: Gregory Moody is alert and oriented to person, place, and time. No cranial nerve deficit.  No dysarthria, no aphasia  Skin: Skin is warm and dry. No rash noted. No erythema.  Psychiatric: Gregory Moody has a normal mood and affect. His behavior is normal. Judgment and thought content normal.    LABORATORY PANEL:   CBC Recent Labs  Lab 04/23/19 1520  WBC 4.2  HGB 8.1*  HCT 25.7*  PLT 455*   ------------------------------------------------------------------------------------------------------------------  Chemistries  Recent Labs  Lab 04/20/19 1108 04/23/19 1520  NA 138 139  K 3.5 3.6  CL 101 102  CO2 27 29  GLUCOSE 154* 202*  BUN 21* 17  CREATININE 1.16 1.32*  CALCIUM 7.9* 8.1*  MG 1.6*  --   AST 33 15  ALT 24 15  ALKPHOS 63 74  BILITOT 0.2* 0.8   ------------------------------------------------------------------------------------------------------------------  Cardiac Enzymes No results  for input(s): TROPONINI in the last 168 hours. ------------------------------------------------------------------------------------------------------------------  RADIOLOGY:  Dg Chest Port 1 View  Result Date: 04/23/2019 CLINICAL DATA:  Two-day history of nausea and vomiting. Current history of diabetes. COVID-19 admission on 03/31/2019. EXAM: PORTABLE CHEST 1 VIEW COMPARISON:  04/16/2019 and earlier. FINDINGS: Stable ground-glass airspace opacity involving the LATERAL RIGHT UPPER LOBE. Recurrent patchy opacities in the LEFT mid lung and LEFT lung base. No confluent airspace consolidation. No visible pleural effusions. IMPRESSION: Recurrent patchy bronchopneumonia involving the LEFT mid lung and LEFT lung base. Stable ground-glass pneumonia involving the RIGHT UPPER LOBE. Electronically Signed   By: Evangeline Dakin M.D.   On: 04/23/2019 15:58    EKG:   Orders placed or performed during the hospital encounter of 04/23/19  . ED EKG 12-Lead  . ED EKG 12-Lead    IMPRESSION AND PLAN:  Principal Problem:   Hypotension -patient's blood pressure initially systolic in the 38H.  Gregory Moody responded some to fluids.  Blood pressure is coming up, is currently in the low 829 systolic.  We will  continue IV fluids for his dehydration and for blood pressure support Active Problems:   Nausea and vomiting -this is potentially due to coronavirus, although this could also be due to the patient's gastroparesis.  We will provide PRN antiemetics including Reglan.  Fluids as above   COVID-19 virus infection -supportive treatment, airborne and contact precautions   Diabetes mellitus with hyperglycemia (HCC) -sliding scale insulin coverage   Diabetic gastroparesis (HCC) -IV Reglan as above   GERD (gastroesophageal reflux disease) -home dose PPI  Chart review performed and case discussed with ED provider. Labs, imaging and/or ECG reviewed by provider and discussed with patient/family. Management plans discussed with the  patient and/or family.  COVID-19 status: Tested positive     DVT PROPHYLAXIS: SubQ lovenox   GI PROPHYLAXIS:  PPI   ADMISSION STATUS: Inpatient     CODE STATUS: Full Code Status History    Date Active Date Inactive Code Status Order ID Comments User Context   04/14/2019 1325 04/20/2019 1926 Full Code [id number]  Lady Deutscher, MD Inpatient   04/02/2019 0123 04/03/2019 1848 Full Code [id number]  Mansy, Arvella Merles, MD ED   03/30/2019 2349 04/01/2019 1809 Full Code [id number]  Mayer Camel, NP ED   03/06/2019 2256 03/07/2019 2039 Full Code [id number]  Mayer Camel, NP ED   02/14/2019 0456 02/15/2019 2030 Full Code [id number]  Harrie Foreman, MD Inpatient   02/02/2019 1729 02/04/2019 1804 Full Code [id number]  Hillary Bow, MD ED   01/03/2019 1944 01/04/2019 2003 Full Code [id number]  Salary, Avel Peace, MD Inpatient   02/23/2018 0126 02/24/2018 1539 Full Code [id number]  Lance Coon, MD ED   04/22/2017 0004 04/22/2017 2213 Full Code [id number]  Lance Coon, MD Inpatient   04/17/2017 2234 04/19/2017 2005 Full Code [id number]  [name], MD Inpatient   04/06/2016 1907 04/10/2016 1928 Full Code [id number]  Loletha Grayer, MD ED   Advance Care Planning Activity      TOTAL TIME TAKING CARE OF THIS PATIENT: 45 minutes.   This patient was evaluated in the context of the global COVID-19 pandemic, which necessitated consideration that the patient might be at risk for infection with the SARS-CoV-2 virus that causes COVID-19. Institutional protocols and algorithms that pertain to the evaluation of patients at risk for COVID-19 are in a state of rapid change based on information released by regulatory bodies including the CDC and federal and state organizations. These policies and algorithms were followed to the best of this provider's knowledge to date during the patient's care at this facility.  Ethlyn Daniels 04/23/2019, 9:56 PM  [facility]  Office  [phone]  CC: Primary care  physician; [place]  Note:  This document was prepared using Systems analyst and may include unintentional dictation errors.

## 2019-04-23 NOTE — ED Notes (Signed)
Attempted to call report x2 Initial call at [phone]

## 2019-04-23 NOTE — ED Notes (Signed)
PT rehooked up to machines after unhooking himself to use restroom. PT reminded to use call bell and reminded of need for urine sample.

## 2019-04-24 ENCOUNTER — Encounter: Payer: Self-pay | Admitting: *Deleted

## 2019-04-24 ENCOUNTER — Other Ambulatory Visit: Payer: Self-pay

## 2019-04-24 ENCOUNTER — Encounter (HOSPITAL_COMMUNITY): Payer: Self-pay | Admitting: Emergency Medicine

## 2019-04-24 ENCOUNTER — Inpatient Hospital Stay (HOSPITAL_COMMUNITY)
Admission: AD | Admit: 2019-04-24 | Discharge: 2019-04-26 | DRG: 312 | Disposition: A | Discharge status: Home Health | Payer: HRSA Program | Source: Other Acute Inpatient Hospital | Attending: Internal Medicine | Admitting: Internal Medicine

## 2019-04-24 DIAGNOSIS — U071 COVID-19: Secondary | ICD-10-CM | POA: Diagnosis not present

## 2019-04-24 DIAGNOSIS — J180 Bronchopneumonia, unspecified organism: Secondary | ICD-10-CM | POA: Diagnosis present

## 2019-04-24 DIAGNOSIS — Z7982 Long term (current) use of aspirin: Secondary | ICD-10-CM

## 2019-04-24 DIAGNOSIS — R112 Nausea with vomiting, unspecified: Secondary | ICD-10-CM

## 2019-04-24 DIAGNOSIS — R739 Hyperglycemia, unspecified: Secondary | ICD-10-CM | POA: Diagnosis present

## 2019-04-24 DIAGNOSIS — F1010 Alcohol abuse, uncomplicated: Secondary | ICD-10-CM | POA: Diagnosis present

## 2019-04-24 DIAGNOSIS — E43 Unspecified severe protein-calorie malnutrition: Secondary | ICD-10-CM | POA: Diagnosis present

## 2019-04-24 DIAGNOSIS — K3184 Gastroparesis: Secondary | ICD-10-CM | POA: Diagnosis present

## 2019-04-24 DIAGNOSIS — N179 Acute kidney failure, unspecified: Secondary | ICD-10-CM | POA: Diagnosis not present

## 2019-04-24 DIAGNOSIS — K219 Gastro-esophageal reflux disease without esophagitis: Secondary | ICD-10-CM | POA: Diagnosis present

## 2019-04-24 DIAGNOSIS — E1143 Type 2 diabetes mellitus with diabetic autonomic (poly)neuropathy: Secondary | ICD-10-CM | POA: Diagnosis present

## 2019-04-24 DIAGNOSIS — I951 Orthostatic hypotension: Principal | ICD-10-CM | POA: Diagnosis present

## 2019-04-24 DIAGNOSIS — E860 Dehydration: Secondary | ICD-10-CM | POA: Diagnosis present

## 2019-04-24 DIAGNOSIS — I959 Hypotension, unspecified: Secondary | ICD-10-CM | POA: Diagnosis not present

## 2019-04-24 DIAGNOSIS — Z79899 Other long term (current) drug therapy: Secondary | ICD-10-CM

## 2019-04-24 DIAGNOSIS — D638 Anemia in other chronic diseases classified elsewhere: Secondary | ICD-10-CM | POA: Diagnosis not present

## 2019-04-24 DIAGNOSIS — E1165 Type 2 diabetes mellitus with hyperglycemia: Secondary | ICD-10-CM | POA: Diagnosis present

## 2019-04-24 DIAGNOSIS — Z794 Long term (current) use of insulin: Secondary | ICD-10-CM

## 2019-04-24 DIAGNOSIS — Z9181 History of falling: Secondary | ICD-10-CM

## 2019-04-24 LAB — BASIC METABOLIC PANEL
Anion gap: 6 (ref 5–15)
BUN: 16 mg/dL (ref 6–20)
CO2: 23 mmol/L (ref 22–32)
Calcium: 7.6 mg/dL — ABNORMAL LOW (ref 8.9–10.3)
Chloride: 111 mmol/L (ref 98–111)
Creatinine, Ser: 1.14 mg/dL (ref 0.61–1.24)
GFR calc Af Amer: >60 mL/min (ref >60)
GFR calc non Af Amer: >60 mL/min (ref >60)
Glucose, Bld: 216 mg/dL — ABNORMAL HIGH (ref 70–99)
Potassium: 3.5 mmol/L (ref 3.5–5.1)
Sodium: 140 mmol/L (ref 135–145)

## 2019-04-24 LAB — CBC
HCT: 24.5 % — ABNORMAL LOW (ref 39.0–52.0)
Hemoglobin: 7.6 g/dL — ABNORMAL LOW (ref 13.0–17.0)
MCH: 30.2 pg (ref 26.0–34.0)
MCHC: 31 g/dL (ref 30.0–36.0)
MCV: 97.2 fL (ref 80.0–100.0)
Platelets: 362 10*3/uL (ref 150–400)
RBC: 2.52 MIL/uL — ABNORMAL LOW (ref 4.22–5.81)
RDW: 14 % (ref 11.5–15.5)
WBC: 4.7 10*3/uL (ref 4.0–10.5)
nRBC: 0 % (ref 0.0–0.2)

## 2019-04-24 LAB — URINALYSIS, COMPLETE (UACMP) WITH MICROSCOPIC
Bacteria, UA: NONE SEEN
Bilirubin Urine: NEGATIVE
Glucose, UA: 150 mg/dL — AB
Hgb urine dipstick: NEGATIVE
Ketones, ur: 5 mg/dL — AB
Leukocytes,Ua: NEGATIVE
Nitrite: NEGATIVE
Protein, ur: NEGATIVE mg/dL
Specific Gravity, Urine: 1.012 (ref 1.005–1.030)
Squamous Epithelial / HPF: NONE SEEN (ref 0–5)
pH: 6 (ref 5.0–8.0)

## 2019-04-24 LAB — GLUCOSE, CAPILLARY
Glucose-Capillary: 208 mg/dL — ABNORMAL HIGH (ref 70–99)
Glucose-Capillary: 209 mg/dL — ABNORMAL HIGH (ref 70–99)
Glucose-Capillary: 157 mg/dL — ABNORMAL HIGH (ref 70–99)
Glucose-Capillary: 217 mg/dL — ABNORMAL HIGH (ref 70–99)

## 2019-04-24 MED ORDER — INSULIN ASPART 100 UNIT/ML ~~LOC~~ SOLN
0.0000 [IU] | Freq: Every day | SUBCUTANEOUS | Status: DC
  Administered 2019-04-25: 3 [IU] via SUBCUTANEOUS

## 2019-04-24 MED ORDER — ONDANSETRON HCL 4 MG/2ML IJ SOLN: 4.0000 mg | Freq: Four times a day (QID) | INTRAMUSCULAR | Status: DC | PRN

## 2019-04-24 MED ORDER — ONDANSETRON HCL 4 MG PO TABS: 4.0000 mg | ORAL_TABLET | Freq: Four times a day (QID) | ORAL | Status: DC | PRN

## 2019-04-24 MED ORDER — METOCLOPRAMIDE HCL 5 MG/ML IJ SOLN
10.0000 mg | Freq: Two times a day (BID) | INTRAMUSCULAR | Status: DC
  Administered 2019-04-24 (×2): 10 mg via INTRAVENOUS
  Filled 2019-04-24 (×2): qty 2

## 2019-04-24 MED ORDER — SODIUM CHLORIDE 0.9 % IV SOLN
INTRAVENOUS | Status: AC
  Administered 2019-04-24 (×2): via INTRAVENOUS

## 2019-04-24 MED ORDER — ENOXAPARIN SODIUM 40 MG/0.4ML ~~LOC~~ SOLN
40.0000 mg | SUBCUTANEOUS | Status: DC
  Administered 2019-04-26: 40 mg via SUBCUTANEOUS
  Filled 2019-04-24: qty 0.4

## 2019-04-24 MED ORDER — DOXYCYCLINE HYCLATE 100 MG PO TABS
100.0000 mg | ORAL_TABLET | Freq: Two times a day (BID) | ORAL | Status: DC
  Administered 2019-04-24: 100 mg via ORAL
  Filled 2019-04-24: qty 1

## 2019-04-24 MED ORDER — INSULIN ASPART 100 UNIT/ML ~~LOC~~ SOLN
0.0000 [IU] | Freq: Three times a day (TID) | SUBCUTANEOUS | Status: DC
  Administered 2019-04-24 (×2): 3 [IU] via SUBCUTANEOUS
  Administered 2019-04-24: 2 [IU] via SUBCUTANEOUS
  Filled 2019-04-24 (×3): qty 1

## 2019-04-24 MED ORDER — ACETAMINOPHEN 650 MG RE SUPP: 650.0000 mg | Freq: Four times a day (QID) | RECTAL | Status: DC | PRN

## 2019-04-24 MED ORDER — ACETAMINOPHEN 325 MG PO TABS: 650.0000 mg | ORAL_TABLET | Freq: Four times a day (QID) | ORAL | Status: DC | PRN

## 2019-04-24 MED ORDER — ENOXAPARIN SODIUM 40 MG/0.4ML ~~LOC~~ SOLN
40.0000 mg | SUBCUTANEOUS | Status: DC
  Administered 2019-04-24: 40 mg via SUBCUTANEOUS
  Filled 2019-04-24: qty 0.4

## 2019-04-24 MED ORDER — INSULIN ASPART 100 UNIT/ML ~~LOC~~ SOLN
0.0000 [IU] | Freq: Three times a day (TID) | SUBCUTANEOUS | Status: DC
  Administered 2019-04-25: 2 [IU] via SUBCUTANEOUS
  Administered 2019-04-25: 1 [IU] via SUBCUTANEOUS
  Administered 2019-04-25: 3 [IU] via SUBCUTANEOUS
  Administered 2019-04-26: 2 [IU] via SUBCUTANEOUS
  Administered 2019-04-26: 5 [IU] via SUBCUTANEOUS
  Administered 2019-04-26: 08:00:00 3 [IU] via SUBCUTANEOUS

## 2019-04-24 MED ORDER — PANTOPRAZOLE SODIUM 40 MG PO TBEC
40.0000 mg | DELAYED_RELEASE_TABLET | Freq: Two times a day (BID) | ORAL | Status: DC
  Administered 2019-04-24 (×2): 40 mg via ORAL
  Filled 2019-04-24 (×2): qty 1

## 2019-04-24 MED ORDER — ASPIRIN EC 81 MG PO TBEC
81.0000 mg | DELAYED_RELEASE_TABLET | Freq: Every day | ORAL | Status: DC
  Administered 2019-04-24: 81 mg via ORAL
  Filled 2019-04-24: qty 1

## 2019-04-24 NOTE — H&P (Signed)
History and Physical  Gregory Moody NFA:[id number] DOB: March 20, 1976 DOA: 04/24/2019  Referring physician: Demetrios Loll PCP: [place]  Patient coming from: [facility] & is able to ambulate   Chief Complaint: Nausea and Vomiting and low blood pressure  HPI: Gregory Moody is a 43 y.o. male with medical history significant for COVID-19, type 2 diabetes mellitus, diabetic gastroparesis and GERD who presents to [facility] ED yesterday due to several days of nausea and vomiting.  Patient was recently diagnosed with COVID-19 and was discharged from this hospital on 7/4 after being treated for similar presentation (nausea, vomiting, generalized weakness and hypotension).   ED Course:  In the ED, patient was noted to be hypotensive (65/36), IV hydration was provided and patient failure responded well to IV fluids.  Patient was also noted to be anemic and presented with hypomagnesemia and acute kidney injury.  Chest x-ray showed recurrent patchy bronchopneumonia involving the left midlung and left lung base. Stable groundglass pneumonia involving the right upper lobe was noted. He was admitted to [facility] overnight and patient was transferred to this facility for further management.  Review of Systems: Constitutional: Negative for chills and fever.  HENT: Negative for ear pain and sore throat.   Eyes: Negative for pain and visual disturbance.  Respiratory: Negative for cough, chest tightness and shortness of breath.   Cardiovascular: Negative for chest pain and palpitations.  Gastrointestinal: Nausea and vomiting.  Negative for abdominal pain. Endocrine: Negative for polyphagia and polyuria.  Genitourinary: Negative for decreased urine volume, dysuria. Musculoskeletal: Negative for arthralgias and back pain.  Skin: Negative for color change and rash.  Allergic/Immunologic: Negative for immunocompromised state.  Neurological: Negative for tremors, syncope, speech difficulty,  weakness, light-headedness and headaches.  Hematological: Does not bruise/bleed easily.    Past Medical History:  Diagnosis Date  . COVID-19   . Diabetes mellitus without complication ([place])   . Gastroparesis   . Tuberculosis    Past Surgical History:  Procedure Laterality Date  . ESOPHAGOGASTRODUODENOSCOPY N/A 02/03/2019   Procedure: ESOPHAGOGASTRODUODENOSCOPY (EGD);  Surgeon: Toledo, Benay Pike, MD;  Location: [facility] ENDOSCOPY;  Service: Gastroenterology;  Laterality: N/A;    Social History:  reports that he has never smoked. He has never used smokeless tobacco. He reports that he does not drink alcohol or use drugs.   No Known Allergies  Family History  Problem Relation Age of Onset  . Diabetes Mother   . Diabetes Father       Prior to Admission medications   Medication Sig Start Date End Date Taking? Authorizing Provider  aspirin EC 81 MG tablet Take 81 mg by mouth daily.    [provider]  doxycycline (VIBRAMYCIN) 100 MG capsule Take 1 capsule (100 mg total) by mouth 2 (two) times daily. 04/19/19   [name], MD  insulin NPH-regular Human (70-30) 100 UNIT/ML injection Inject 20 Units into the skin 2 (two) times daily with a meal. 25 units with breakfast in am and 35 units with supper 04/03/19   [name], MD  loperamide (IMODIUM A-D) 2 MG tablet Take 1 tablet (2 mg total) by mouth 4 (four) times daily as needed for diarrhea or loose stools. 04/19/19   [name], MD  ondansetron (ZOFRAN) 4 MG tablet Take 1 tablet (4 mg total) by mouth every 6 (six) hours as needed for nausea. 04/03/19   [name], MD  pantoprazole (PROTONIX) 40 MG tablet Take 1 tablet (40 mg total) by mouth 2 (two) times daily before  a meal. 04/03/19   [name], MD    Physical Exam: BP 124/83 (BP Location: Left Arm)   Pulse 65   Temp 97.7 F (36.5 C) (Oral)   Resp 18   Ht 5\' 8"  (1.727 m)   Wt 56 kg   SpO2 100%   BMI 18.77 kg/m   . General: 43  y.o. year-old male well developed well nourished in no acute distress.  Alert and oriented x3. [name] HENT: Normocephalic and atraumatic, pupils equal round and reactive to light and accommodation, no scleral icterus . Neck: Normal range of motion.  Neck supple.  No JVD . Cardiovascular: Regular rate and rhythm with no rubs or gallops.  No thyromegaly or JVD noted.  No lower extremity edema. 2/4 pulses in all 4 extremities. [name] Respiratory: Rales more audible in LLL. Clear to auscultation with no wheezes. Good inspiratory effort. . Abdomen: Soft nontender nondistended with normal bowel sounds x4 quadrants. . Muskuloskeletal: No cyanosis, clubbing or edema noted bilaterally . Neuro: CN II-XII intact, strength, sensation, reflexes . Skin: No ulcerative lesions noted or rashes . Psychiatry: Judgement and insight appear normal. Mood is appropriate for condition and setting          Labs on Admission:  Basic Metabolic Panel: Recent Labs  Lab 04/18/19 0800 04/19/19 0300 04/19/19 2048 04/20/19 1108 04/23/19 1520 04/24/19 0618  NA 141 140 139 138 139 140  K 3.2* 3.2* 3.3* 3.5 3.6 3.5  CL 103 102 102 101 102 111  CO2 28 27 28 27 29 23   GLUCOSE 55* 191* 190* 154* 202* 216*  BUN 28* 27* 27* 21* 17 16  CREATININE 1.45* 1.33* 1.34* 1.16 1.32* 1.14  CALCIUM 7.9* 8.0* 7.9* 7.9* 8.1* 7.6*  MG 1.5* 1.5*  --  1.6*  --   --    Liver Function Tests: Recent Labs  Lab 04/18/19 0800 04/19/19 0300 04/20/19 1108 04/23/19 1520  AST 20 14* 33 15  ALT 19 15 24 15   ALKPHOS 77 70 63 74  BILITOT 0.2* 0.2* 0.2* 0.8  PROT 6.2* 6.6 5.7* 6.4*  ALBUMIN 2.7* 2.7* 2.4* 2.8*   Recent Labs  Lab 04/18/19 0800 04/23/19 1520  LIPASE 112* 54*   No results for input(s): AMMONIA in the last 168 hours. CBC: Recent Labs  Lab 04/18/19 0800 04/19/19 0300 04/19/19 2048 04/20/19 1108 04/23/19 1520 04/24/19 0618  WBC 8.5 9.5 7.0 4.8 4.2 4.7  NEUTROABS 7.7 8.9*  --  3.1  --   --   HGB 8.8* 8.8* 7.9* 8.2* 8.1*  7.6*  HCT 27.1* 27.6* 24.6* 25.5* 25.7* 24.5*  MCV 93.8 95.2 95.0 96.6 96.3 97.2  PLT 395 399 364 429* 455* 362   Cardiac Enzymes: No results for input(s): CKTOTAL, CKMB, CKMBINDEX, TROPONINI in the last 168 hours.  BNP (last 3 results) Recent Labs    04/19/19 0300 04/20/19 0500 04/20/19 1108  BNP 450.9* SPECIMEN(S) LOST IN TRANSIT DUPLICATE  376.2*    ProBNP (last 3 results) No results for input(s): PROBNP in the last 8760 hours.  CBG: Recent Labs  Lab 04/23/19 1518 04/24/19 0855 04/24/19 1202 04/24/19 1724 04/24/19 2250  GLUCAP 175* 208* 209* 157* 217*    Radiological Exams on Admission: Dg Chest Port 1 View  Result Date: 04/23/2019 CLINICAL DATA:  Two-day history of nausea and vomiting. Current history of diabetes. COVID-19 admission on 03/31/2019. EXAM: PORTABLE CHEST 1 VIEW COMPARISON:  04/16/2019 and earlier. FINDINGS: Stable ground-glass airspace opacity involving the LATERAL RIGHT UPPER LOBE. Recurrent  patchy opacities in the LEFT mid lung and LEFT lung base. No confluent airspace consolidation. No visible pleural effusions. IMPRESSION: Recurrent patchy bronchopneumonia involving the LEFT mid lung and LEFT lung base. Stable ground-glass pneumonia involving the RIGHT UPPER LOBE. Electronically Signed   By: Evangeline Dakin M.D.   On: 04/23/2019 15:58    EKG: EKG was not done  Assessment/Plan Present on Admission: . Hyperglycemia . GERD (gastroesophageal reflux disease) . Diabetes mellitus with hyperglycemia ([place]) . Diabetic gastroparesis ([place]) . Nausea and vomiting . AKI (acute kidney injury) ([place]) . Hypotension . COVID-19 virus infection  Principal Problem:   Hypotension Active Problems:   Hyperglycemia   Diabetes mellitus with hyperglycemia ([facility])   GERD (gastroesophageal reflux disease)   Diabetic gastroparesis ([facility])   AKI (acute kidney injury) ([facility])   Nausea and vomiting   COVID-19 virus infection   Anemia of chronic disease  Hypotension SBP  was in the 60s when seen at [facility] ED, this has since improved (currently at 124/83).  Gentle hydration will be provided. Continue to monitor BP and check orthostatics in the morning.  Nausea and vomiting( Improved) This is possibly due to COVID-19 virus infection, but may also be due to patient's history of diabetic gastroparesis.  Continue metoclopramide as needed.  COVID-19 virus infection Patient currently without hypoxia or shortness of breath, continues to remain stable on room air.  We shall continue to monitor patient with supportive care. Inflammatory markers will be checked in the morning  Diabetes mellitus with hyperglycemia Continue insulin sliding scale and hypoglycemic protocol  Hypomagnesemia Magnesium level based on last lab on medical record (7/13) was 1.6 This will be rechecked and managed accordingly.  GERD Continue PPI  Diabetic gastroparesis Continue Reglan as needed  Acute kidney injury (resolved)  This is possibly secondary to dehydration Continue gentle IV hydration as indicated above Continue to monitor BUN/creatinine with morning labs  Anemia of chronic disease Stable  DVT prophylaxis: Lovenox  Code Status: Full  Family Communication: None at bedside  Disposition Plan: Plan to discharge patient home once clinically improved  Consults called: None  Admission status: Inpatient  Bernadette Hoit MD [facility]  If 7PM-7AM, please contact night-coverage [email or web address]   04/25/2019, 1:26 AM

## 2019-04-24 NOTE — Plan of Care (Signed)
  Problem: Education: Goal: Knowledge of risk factors and measures for prevention of condition will improve Outcome: Progressing   Problem: Coping: Goal: Psychosocial and spiritual needs will be supported Outcome: Progressing   Problem: Respiratory: Goal: Will maintain a patent airway Outcome: Progressing Goal: Complications related to the disease process, condition or treatment will be avoided or minimized Outcome: Progressing   Problem: Education: Goal: Knowledge of General Education information will improve Description: Including pain rating scale, medication(s)/side effects and non-pharmacologic comfort measures Outcome: Progressing   Problem: Health Behavior/Discharge Planning: Goal: Ability to manage health-related needs will improve Outcome: Progressing   Problem: Health Behavior/Discharge Planning: Goal: Ability to manage health-related needs will improve Outcome: Progressing   Problem: Clinical Measurements: Goal: Ability to maintain clinical measurements within normal limits will improve Outcome: Progressing Goal: Will remain free from infection Outcome: Progressing Goal: Diagnostic test results will improve Outcome: Progressing Goal: Respiratory complications will improve Outcome: Progressing Goal: Cardiovascular complication will be avoided Outcome: Progressing   Problem: Activity: Goal: Risk for activity intolerance will decrease Outcome: Progressing   Problem: Nutrition: Goal: Adequate nutrition will be maintained Outcome: Progressing   Problem: Coping: Goal: Level of anxiety will decrease Outcome: Progressing   Problem: Elimination: Goal: Will not experience complications related to bowel motility Outcome: Progressing Goal: Will not experience complications related to urinary retention Outcome: Progressing   Problem: Pain Managment: Goal: General experience of comfort will improve Outcome: Progressing   Problem: Safety: Goal: Ability to  remain free from injury will improve Outcome: Progressing   Problem: Skin Integrity: Goal: Risk for impaired skin integrity will decrease Outcome: Progressing

## 2019-04-24 NOTE — Plan of Care (Signed)
Plan per Dr. Radford Pax pt can tolerate solid food without N/V, then he will be discharged home instead of transferring to [facility].   Problem: Education: Goal: Knowledge of General Education information will improve Description: Including pain rating scale, medication(s)/side effects and non-pharmacologic comfort measures Outcome: Progressing   Problem: Health Behavior/Discharge Planning: Goal: Ability to manage health-related needs will improve Outcome: Progressing   Problem: Clinical Measurements: Goal: Ability to maintain clinical measurements within normal limits will improve Outcome: Progressing Goal: Will remain free from infection Outcome: Progressing Goal: Diagnostic test results will improve Outcome: Progressing Goal: Respiratory complications will improve Outcome: Progressing Goal: Cardiovascular complication will be avoided Outcome: Progressing   Problem: Activity: Goal: Risk for activity intolerance will decrease Outcome: Progressing   Problem: Nutrition: Goal: Adequate nutrition will be maintained Outcome: Progressing   Problem: Coping: Goal: Level of anxiety will decrease Outcome: Progressing   Problem: Elimination: Goal: Will not experience complications related to bowel motility Outcome: Progressing Goal: Will not experience complications related to urinary retention Outcome: Progressing   Problem: Pain Managment: Goal: General experience of comfort will improve Outcome: Progressing   Problem: Safety: Goal: Ability to remain free from injury will improve Outcome: Progressing   Problem: Skin Integrity: Goal: Risk for impaired skin integrity will decrease Outcome: Progressing

## 2019-04-24 NOTE — Progress Notes (Addendum)
Pt tolerated regular diet at lunch.  Denies N/V/D.  Dr. Bridgett Larsson updated.  Received orders to ambulate pt and recheck BP.  If pt does not become hypotensive, he may be discharged home.  1530:  Pt did not tolerate ambulation in room very well.  Pt became weak, dizzy and chest tight with BP 87/64.  BP returned to normal range with rest in bed.  Dr. Bridgett Larsson updated.  1750:  Pt has been assigned to room #118 at [facility].  Attempted to call report to receiving nurse, Andee Poles, but she was unavailable at this time.

## 2019-04-24 NOTE — Discharge Summary (Addendum)
[facility] NAME: Gregory Moody    MR#:  [id number]  DATE OF BIRTH:  07-23-1976  DATE OF ADMISSION:  04/23/2019   ADMITTING PHYSICIAN: Lance Coon, MD  DATE OF DISCHARGE: 04/24/2019 PRIMARY CARE PHYSICIAN: [place]   ADMISSION DIAGNOSIS:  Dehydration [E86.0] Hypotension, unspecified hypotension type [I95.9] COVID-19 [U07.1, J98.8] DISCHARGE DIAGNOSIS:  Principal Problem:   Hypotension Active Problems:   Diabetes mellitus with hyperglycemia (HCC)   GERD (gastroesophageal reflux disease)   Diabetic gastroparesis (HCC)   Nausea and vomiting   COVID-19 virus infection  SECONDARY DIAGNOSIS:   Past Medical History:  Diagnosis Date   COVID-19    Diabetes mellitus without complication ([place])    Gastroparesis    Tuberculosis    HOSPITAL COURSE:    Hypotension -patient's blood pressure initially systolic in the 74B.  He responded some to fluids.  Blood pressure is coming up, is currently in the low 638 systolic.   Continue IV fluids for his dehydration and for blood pressure support. Pt did not tolerate ambulation in room very well.  Pt became weak, dizzy and chest tight with BP 87/64.  BP returned to normal range with rest in bed.    Nausea and vomiting -this is potentially due to coronavirus, although this could also be due to the patient's gastroparesis.  PRN antiemetics including Reglan.  Fluids as above.  He feels better, tolerated diet. Acute renal failure due to dehydration.  Improved with IV fluid support.   COVID-19 virus infection -supportive treatment, airborne and contact precautions   Diabetes mellitus with hyperglycemia ([place]) -sliding scale insulin coverage   Diabetic gastroparesis (HCC) -IV Reglan as above   GERD (gastroesophageal reflux disease) -home dose PPI I discussed with Dr. Gordy Councilman, who kindly accepted patient. I discussed with Dr. Avon Gully again about the patient's  current condition and the orthostatic hypotension.  He clinically agreed to accepting the patient. DISCHARGE CONDITIONS:  Guarded, transfer to [facility]. CONSULTS OBTAINED:   DRUG ALLERGIES:  No Known Allergies DISCHARGE MEDICATIONS:   Allergies as of 04/24/2019   No Known Allergies     Medication List    TAKE these medications   aspirin EC 81 MG tablet Take 81 mg by mouth daily.   doxycycline 100 MG capsule Commonly known as: VIBRAMYCIN Take 1 capsule (100 mg total) by mouth 2 (two) times daily.   insulin NPH-regular Human (70-30) 100 UNIT/ML injection Inject 20 Units into the skin 2 (two) times daily with a meal. 25 units with breakfast in am and 35 units with supper   loperamide 2 MG tablet Commonly known as: Imodium A-D Take 1 tablet (2 mg total) by mouth 4 (four) times daily as needed for diarrhea or loose stools.   ondansetron 4 MG tablet Commonly known as: ZOFRAN Take 1 tablet (4 mg total) by mouth every 6 (six) hours as needed for nausea.   pantoprazole 40 MG tablet Commonly known as: PROTONIX Take 1 tablet (40 mg total) by mouth 2 (two) times daily before a meal.        DISCHARGE INSTRUCTIONS:  See AVS. If you experience worsening of your admission symptoms, develop shortness of breath, life threatening emergency, suicidal or homicidal thoughts you must seek medical attention immediately by calling 911 or calling your MD immediately  if symptoms less severe.  You Must read complete instructions/literature along with all the possible adverse reactions/side effects for all the Medicines you take and  that have been prescribed to you. Take any new Medicines after you have completely understood and accpet all the possible adverse reactions/side effects.   Please note  You were cared for by a hospitalist during your hospital stay. If you have any questions about your discharge medications or the care you received while you were in the hospital after you  are discharged, you can call the unit and asked to speak with the hospitalist on call if the hospitalist that took care of you is not available. Once you are discharged, your primary care physician will handle any further medical issues. Please note that NO REFILLS for any discharge medications will be authorized once you are discharged, as it is imperative that you return to your primary care physician (or establish a relationship with a primary care physician if you do not have one) for your aftercare needs so that they can reassess your need for medications and monitor your lab values.    On the day of Discharge:  VITAL SIGNS:  Blood pressure 116/82, pulse 68, temperature 97.9 F (36.6 C), temperature source Oral, resp. rate 20, height 5\' 8"  (1.727 m), SpO2 98 %. PHYSICAL EXAMINATION:  GENERAL:  43 y.o.-year-old patient lying in the bed with no acute distress.  EYES: Pupils equal, round, reactive to light and accommodation. No scleral icterus. Extraocular muscles intact.  HEENT: Head atraumatic, normocephalic. Oropharynx and nasopharynx clear.  NECK:  Supple, no jugular venous distention. No thyroid enlargement, no tenderness.  LUNGS: Normal breath sounds bilaterally, no wheezing, rales,rhonchi or crepitation. No use of accessory muscles of respiration.  CARDIOVASCULAR: S1, S2 normal. No murmurs, rubs, or gallops.  ABDOMEN: Soft, non-tender, non-distended. Bowel sounds present. No organomegaly or mass.  EXTREMITIES: No pedal edema, cyanosis, or clubbing.  NEUROLOGIC: Cranial nerves II through XII are intact. Muscle strength 4/5 in all extremities. Sensation intact. Gait not checked.  PSYCHIATRIC: The patient is alert and oriented x 3.  SKIN: No obvious rash, lesion, or ulcer.  DATA REVIEW:   CBC Recent Labs  Lab 04/24/19 0618  WBC 4.7  HGB 7.6*  HCT 24.5*  PLT 362    Chemistries  Recent Labs  Lab 04/20/19 1108 04/23/19 1520 04/24/19 0618  NA 138 139 140  K 3.5 3.6 3.5  CL  101 102 111  CO2 27 29 23   GLUCOSE 154* 202* 216*  BUN 21* 17 16  CREATININE 1.16 1.32* 1.14  CALCIUM 7.9* 8.1* 7.6*  MG 1.6*  --   --   AST 33 15  --   ALT 24 15  --   ALKPHOS 63 74  --   BILITOT 0.2* 0.8  --      Microbiology Results  Results for orders placed or performed during the hospital encounter of 04/23/19  Blood Culture (routine x 2)     Status: None (Preliminary result)   Collection Time: 04/23/19  4:08 PM   Specimen: BLOOD  Result Value Ref Range Status   Specimen Description BLOOD BLOOD LEFT HAND  Final   Special Requests   Final    BOTTLES DRAWN AEROBIC AND ANAEROBIC Blood Culture adequate volume   Culture   Final    NO GROWTH < 24 HOURS Performed at [facility], [address] [place], [place] [address]    Report Status PENDING  Incomplete  Blood Culture (routine x 2)     Status: None (Preliminary result)   Collection Time: 04/23/19  4:08 PM   Specimen: BLOOD  Result Value  Ref Range Status   Specimen Description BLOOD LEFT ANTECUBITAL  Final   Special Requests   Final    BOTTLES DRAWN AEROBIC AND ANAEROBIC Blood Culture adequate volume   Culture   Final    NO GROWTH < 24 HOURS Performed at [facility], [address] [place], [place] [address]    Report Status PENDING  Incomplete  SARS Coronavirus 2 (CEPHEID - Performed in [facility] hospital lab), Hosp Order     Status: Abnormal   Collection Time: 04/23/19  4:08 PM   Specimen: Nasopharyngeal Swab  Result Value Ref Range Status   SARS Coronavirus 2 POSITIVE (A) NEGATIVE Final    Comment: RESULT CALLED TO, READ BACK BY AND VERIFIED WITH: PAIGE JOHNSON @1738  04/23/19 AKT (NOTE) If result is NEGATIVE SARS-CoV-2 target nucleic acids are NOT DETECTED. The SARS-CoV-2 RNA is generally detectable in upper and lower  respiratory specimens during the acute phase of infection. The lowest  concentration of SARS-CoV-2 viral copies this assay can detect is 250  copies / mL. A negative  result does not preclude SARS-CoV-2 infection  and should not be used as the sole basis for treatment or other  patient management decisions.  A negative result may occur with  improper specimen collection / handling, submission of specimen other  than nasopharyngeal swab, presence of viral mutation(s) within the  areas targeted by this assay, and inadequate number of viral copies  (<250 copies / mL). A negative result must be combined with clinical  observations, patient history, and epidemiological information. If result is POSITIVE SARS-CoV-2 target nucleic acids are DETECTED. The S ARS-CoV-2 RNA is generally detectable in upper and lower  respiratory specimens during the acute phase of infection.  Positive  results are indicative of active infection with SARS-CoV-2.  Clinical  correlation with patient history and other diagnostic information is  necessary to determine patient infection status.  Positive results do  not rule out bacterial infection or co-infection with other viruses. If result is PRESUMPTIVE POSTIVE SARS-CoV-2 nucleic acids MAY BE PRESENT.   A presumptive positive result was obtained on the submitted specimen  and confirmed on repeat testing.  While 2019 novel coronavirus  (SARS-CoV-2) nucleic acids may be present in the submitted sample  additional confirmatory testing may be necessary for epidemiological  and / or clinical management purposes  to differentiate between  SARS-CoV-2 and other Sarbecovirus currently known to infect humans.  If clinically indicated additional testing with an alternate test  methodology [phone]) is adv ised. The SARS-CoV-2 RNA is generally  detectable in upper and lower respiratory specimens during the acute  phase of infection. The expected result is Negative. Fact Sheet for Patients:  StrictlyIdeas.no Fact Sheet for Healthcare Providers: BankingDealers.co.za This test is not yet  approved or cleared by the [place] FDA and has been authorized for detection and/or diagnosis of SARS-CoV-2 by FDA under an Emergency Use Authorization (EUA).  This EUA will remain in effect (meaning this test can be used) for the duration of the COVID-19 declaration under Section 564(b)(1) of the Act, 21 U.S.C. section 360bbb-3(b)(1), unless the authorization is terminated or revoked sooner. Performed at [facility], [address], [place], [place] [address]     RADIOLOGY:  Dg Chest Port 1 View  Result Date: 04/23/2019 CLINICAL DATA:  Two-day history of nausea and vomiting. Current history of diabetes. COVID-19 admission on 03/31/2019. EXAM: PORTABLE CHEST 1 VIEW COMPARISON:  04/16/2019 and earlier. FINDINGS: Stable ground-glass airspace opacity involving the  LATERAL RIGHT UPPER LOBE. Recurrent patchy opacities in the LEFT mid lung and LEFT lung base. No confluent airspace consolidation. No visible pleural effusions. IMPRESSION: Recurrent patchy bronchopneumonia involving the LEFT mid lung and LEFT lung base. Stable ground-glass pneumonia involving the RIGHT UPPER LOBE. Electronically Signed   By: Evangeline Dakin M.D.   On: 04/23/2019 15:58     Management plans discussed with the patient, family and they are in agreement.  CODE STATUS: Full Code   TOTAL TIME TAKING CARE OF THIS PATIENT: 46 minutes.    Demetrios Loll M.D on 04/24/2019 at 10:07 AM  Between 7am to 6pm - Pager - [phone]  After 6pm go to [email or web address] - Technical brewer [facility]  Office  [phone]  CC: Primary care physician; [place]   Note: This dictation was prepared with Dragon dictation along with smaller Company secretary. Any transcriptional errors that result from this process are unintentional.

## 2019-04-24 NOTE — Progress Notes (Addendum)
Received notification of bed vacancy at [facility].  Dr. Bridgett Larsson notified of same and inquired if pt will be transferred to [facility].  0816--received orders to transfer  0922--awaiting transfer update from physician

## 2019-04-24 NOTE — Progress Notes (Signed)
Received notification from Infectious Diseases that pt tested positive for c-diff within the last 7 days.  Enteric precautions added to airborne and contact for +CV19.

## 2019-04-25 DIAGNOSIS — E1143 Type 2 diabetes mellitus with diabetic autonomic (poly)neuropathy: Secondary | ICD-10-CM

## 2019-04-25 DIAGNOSIS — K3184 Gastroparesis: Secondary | ICD-10-CM

## 2019-04-25 DIAGNOSIS — D638 Anemia in other chronic diseases classified elsewhere: Secondary | ICD-10-CM

## 2019-04-25 DIAGNOSIS — I959 Hypotension, unspecified: Secondary | ICD-10-CM | POA: Diagnosis not present

## 2019-04-25 DIAGNOSIS — N179 Acute kidney failure, unspecified: Secondary | ICD-10-CM | POA: Diagnosis not present

## 2019-04-25 DIAGNOSIS — U071 COVID-19: Secondary | ICD-10-CM | POA: Diagnosis not present

## 2019-04-25 LAB — URINE CULTURE: Culture: 10000 — AB

## 2019-04-25 LAB — COMPREHENSIVE METABOLIC PANEL
ALT: 14 U/L (ref 0–44)
AST: 16 U/L (ref 15–41)
Albumin: 2.3 g/dL — ABNORMAL LOW (ref 3.5–5.0)
Alkaline Phosphatase: 70 U/L (ref 38–126)
Anion gap: 7 (ref 5–15)
BUN: 10 mg/dL (ref 6–20)
CO2: 23 mmol/L (ref 22–32)
Calcium: 7.6 mg/dL — ABNORMAL LOW (ref 8.9–10.3)
Chloride: 107 mmol/L (ref 98–111)
Creatinine, Ser: 1.23 mg/dL (ref 0.61–1.24)
GFR calc Af Amer: >60 mL/min (ref >60)
GFR calc non Af Amer: >60 mL/min (ref >60)
Glucose, Bld: 227 mg/dL — ABNORMAL HIGH (ref 70–99)
Potassium: 3.2 mmol/L — ABNORMAL LOW (ref 3.5–5.1)
Sodium: 137 mmol/L (ref 135–145)
Total Bilirubin: 0.1 mg/dL — ABNORMAL LOW (ref 0.3–1.2)
Total Protein: 5.4 g/dL — ABNORMAL LOW (ref 6.5–8.1)

## 2019-04-25 LAB — CBC WITH DIFFERENTIAL/PLATELET
Abs Immature Granulocytes: 0.01 10*3/uL (ref 0.00–0.07)
Basophils Absolute: 0 10*3/uL (ref 0.0–0.1)
Basophils Relative: 0 %
Eosinophils Absolute: 0.1 10*3/uL (ref 0.0–0.5)
Eosinophils Relative: 2 %
HCT: 21.8 % — ABNORMAL LOW (ref 39.0–52.0)
Hemoglobin: 6.9 g/dL — CL (ref 13.0–17.0)
Immature Granulocytes: 0 %
Lymphocytes Relative: 40 %
Lymphs Abs: 1.6 10*3/uL (ref 0.7–4.0)
MCH: 30.7 pg (ref 26.0–34.0)
MCHC: 31.7 g/dL (ref 30.0–36.0)
MCV: 96.9 fL (ref 80.0–100.0)
Monocytes Absolute: 0.4 10*3/uL (ref 0.1–1.0)
Monocytes Relative: 10 %
Neutro Abs: 1.9 10*3/uL (ref 1.7–7.7)
Neutrophils Relative %: 48 %
Platelets: 355 10*3/uL (ref 150–400)
RBC: 2.25 MIL/uL — ABNORMAL LOW (ref 4.22–5.81)
RDW: 14.3 % (ref 11.5–15.5)
WBC: 4 10*3/uL (ref 4.0–10.5)
nRBC: 0 % (ref 0.0–0.2)

## 2019-04-25 LAB — HEMOGLOBIN AND HEMATOCRIT, BLOOD
HCT: 22.7 % — ABNORMAL LOW (ref 39.0–52.0)
Hemoglobin: 7.1 g/dL — ABNORMAL LOW (ref 13.0–17.0)

## 2019-04-25 LAB — GLUCOSE, CAPILLARY
Glucose-Capillary: 272 mg/dL — ABNORMAL HIGH (ref 70–99)
Glucose-Capillary: 161 mg/dL — ABNORMAL HIGH (ref 70–99)
Glucose-Capillary: 224 mg/dL — ABNORMAL HIGH (ref 70–99)
Glucose-Capillary: 143 mg/dL — ABNORMAL HIGH (ref 70–99)
Glucose-Capillary: 145 mg/dL — ABNORMAL HIGH (ref 70–99)

## 2019-04-25 LAB — TRIGLYCERIDES: Triglycerides: 186 mg/dL — ABNORMAL HIGH (ref <150)

## 2019-04-25 LAB — CK: Total CK: 20 U/L — ABNORMAL LOW (ref 49–397)

## 2019-04-25 LAB — MAGNESIUM: Magnesium: 1.5 mg/dL — ABNORMAL LOW (ref 1.7–2.4)

## 2019-04-25 LAB — C-REACTIVE PROTEIN: CRP: 1 mg/dL — ABNORMAL HIGH (ref <1.0)

## 2019-04-25 LAB — D-DIMER, QUANTITATIVE: D-Dimer, Quant: 1.96 ug/mL-FEU — ABNORMAL HIGH (ref 0.00–0.50)

## 2019-04-25 LAB — PHOSPHORUS: Phosphorus: 1.6 mg/dL — ABNORMAL LOW (ref 2.5–4.6)

## 2019-04-25 LAB — FERRITIN: Ferritin: 269 ng/mL (ref 24–336)

## 2019-04-25 MED ORDER — ACETAMINOPHEN 325 MG PO TABS
650.0000 mg | ORAL_TABLET | Freq: Four times a day (QID) | ORAL | Status: DC | PRN
  Administered 2019-04-25: 650 mg via ORAL
  Filled 2019-04-25: qty 2

## 2019-04-25 MED ORDER — POTASSIUM CHLORIDE 20 MEQ/15ML (10%) PO SOLN
40.0000 meq | Freq: Two times a day (BID) | ORAL | Status: AC
  Administered 2019-04-25 (×2): 40 meq via ORAL
  Filled 2019-04-25 (×3): qty 30

## 2019-04-25 MED ORDER — SODIUM CHLORIDE 0.9 % IV SOLN
Freq: Once | INTRAVENOUS | Status: AC
  Administered 2019-04-25: 03:00:00 via INTRAVENOUS

## 2019-04-25 MED ORDER — SODIUM CHLORIDE 0.9 % IV BOLUS
500.0000 mL | Freq: Once | INTRAVENOUS | Status: AC
  Administered 2019-04-25: 500 mL via INTRAVENOUS

## 2019-04-25 MED ORDER — PANTOPRAZOLE SODIUM 40 MG PO TBEC
40.0000 mg | DELAYED_RELEASE_TABLET | Freq: Every day | ORAL | Status: DC
  Administered 2019-04-25 – 2019-04-26 (×2): 40 mg via ORAL
  Filled 2019-04-25 (×2): qty 1

## 2019-04-25 MED ORDER — METOCLOPRAMIDE HCL 5 MG/ML IJ SOLN
10.0000 mg | Freq: Four times a day (QID) | INTRAMUSCULAR | Status: DC | PRN
  Filled 2019-04-25: qty 2

## 2019-04-25 NOTE — Progress Notes (Signed)
Administering N/S bolus 500, ordered by MD

## 2019-04-25 NOTE — Progress Notes (Addendum)
I updated Gregory Moody, pt's sister about pt's condition. Per Mrs. Gregory Moody, if pt gets discharge tomorrow, she can only pick him up after 4:30 PM.   I asked Mrs. Gregory Moody if pt does not help with his own care/activities at home, sister reports that there have been several times when pt went without eating because he refuses to serve himself a plate of food while she's at work; she reports that pt tells her that if she does not serve him the food he will not eat. She also reports that pt will not try to take a bath, drink water, and she reports an episode when pt defecated in the house and she states that "there was poop everywhere." Will pass this on to the night nurse to request a possible psychiatric consult from MD.

## 2019-04-25 NOTE — Progress Notes (Addendum)
Pt's orthostatic VS are positive, PT is c/o pain on his right rib cage, he states he fell saturday on his chest/ fell over a chair because he felt dizzy, he states that it takes him about 5 minutes to recover "every time" he falls. Pt reports about "20 falls" in the last 6 months, MD made aware.

## 2019-04-25 NOTE — Progress Notes (Addendum)
Progress Note  Gregory Moody VPX:[id number] DOB: 05/06/1976 DOA: 04/24/2019  Referring physician: Demetrios Loll PCP: [place]  Patient coming from: Home via [facility] & is able to ambulate without difficulty  Chief Complaint: Nausea and Vomiting and low blood pressure  HPI: Gregory Moody is a 43 y.o. male with medical history significant for COVID-19, type 2 diabetes mellitus, diabetic gastroparesis and GERD who presents to [facility] ED yesterday due to several days of nausea and vomiting.  Patient was recently diagnosed with COVID-19 and was discharged from this hospital on 7/4 after being treated for similar presentation (nausea, vomiting, generalized weakness and hypotension). In the ED, patient was noted to be hypotensive (65/36), IV hydration was provided and patient failure responded well to IV fluids.  Patient was also noted to be anemic and presented with hypomagnesemia and acute kidney injury.  Chest x-ray showed recurrent patchy bronchopneumonia involving the left midlung and left lung base. Stable groundglass pneumonia involving the right upper lobe was noted. He was admitted to [facility] overnight and patient was transferred to this facility for further management.  Subjective: No acute issues or events overnight, patient feels quite well, indicates he is hungry, declines any chest pain, shortness of breath, nausea, vomiting, diarrhea, constipation, headache, fevers, chills, black tarry stool, bright red blood per rectum,  hematemesis.  EKG: EKG was not done  Assessment/Plan Principal Problem:   Hypotension Active Problems:   Hyperglycemia   Diabetes mellitus with hyperglycemia (HCC)   GERD (gastroesophageal reflux disease)   Diabetic gastroparesis (HCC)   AKI (acute kidney injury) (HCC)   Nausea and vomiting   COVID-19 virus infection   Anemia of chronic disease   Hypomagnesemia  Hypotension, poor PO intake, ?failure to thrive SBP was in the 60s  when seen at [facility] ED, remains labile but generally improved.   Continue to encourage p.o. intake - eating/drinking very well here Orthostatics positive; continue PT OT given fall risk history Continue IV fluids; 2x 500cc bolus this am due to hypotension with SBP in the 80s.  Acute on chronic anemia, asymptomatic, likely secondary to poor p.o. intake and malnutrition  Hemoglobin minimally downtrending overnight, 6.9 with morning labs, repeat 7.1 Patient's baseline appears to be around 8-9, again likely 2/2 poor PO intake  Intractable nausea and vomiting, resolving Likely in the setting of COVID-19 virus infection Cannot rule out concurrent diabetic gastroparesis exacerbation given history.  Continue metoclopramide.  COVID-19 virus infection Patient currently without hypoxia or shortness of breath, continues to remain stable on room air.  Recent Labs    04/25/19 0500  DDIMER 1.96*  FERRITIN 269  CRP 1.0*   Lab Results  Component Value Date   SARSCOV2NAA POSITIVE (A) 04/23/2019   SARSCOV2NAA POSITIVE (A) 04/13/2019   SARSCOV2NAA NOT DETECTED 03/30/2019   [place] NEGATIVE 03/17/2019   Diabetes mellitus with hyperglycemia Continue insulin sliding scale and hypoglycemic protocol  Hypomagnesemia Magnesium level based on last lab on medical record (7/13) was 1.6 This will be rechecked and managed accordingly.  GERD Continue PPI  Diabetic gastroparesis Continue Reglan as needed  Severe protein caloric malnutrition In the setting of chronic gastroparesis and poor p.o. intake Reported history it appears patient has alcohol abuse past with malnutrition previously Continue to increase diet as tolerated, patient now eating without difficulty Dietary supplementation to ensure improving calorie intake  Acute kidney injury (resolved)  This is possibly secondary to dehydration/poor PO intake Continue gentle IV hydration as indicated above Continue to monitor BUN/creatinine  with morning labs  DVT prophylaxis: Lovenox Code Status: Full Family Communication: None at bedside Disposition Plan: Inpatient, likely discharge back home in the next 24 to 48 hours pending clinical course, resolution of orthostatic hypotension and improved p.o. intake. Consults called: None Admission status: Inpatient   Past Medical History:  Diagnosis Date  . COVID-19   . Diabetes mellitus without complication ([place])   . Gastroparesis   . Tuberculosis    Past Surgical History:  Procedure Laterality Date  . ESOPHAGOGASTRODUODENOSCOPY N/A 02/03/2019   Procedure: ESOPHAGOGASTRODUODENOSCOPY (EGD);  Surgeon: Toledo, Benay Pike, MD;  Location: [facility] ENDOSCOPY;  Service: Gastroenterology;  Laterality: N/A;    Social History:  reports that he has never smoked. He has never used smokeless tobacco. He reports that he does not drink alcohol or use drugs.   No Known Allergies  Family History  Problem Relation Age of Onset  . Diabetes Mother   . Diabetes Father       Prior to Admission medications   Medication Sig Start Date End Date Taking? Authorizing Provider  aspirin EC 81 MG tablet Take 81 mg by mouth daily.    [provider]  doxycycline (VIBRAMYCIN) 100 MG capsule Take 1 capsule (100 mg total) by mouth 2 (two) times daily. 04/19/19   [name], MD  insulin NPH-regular Human (70-30) 100 UNIT/ML injection Inject 20 Units into the skin 2 (two) times daily with a meal. 25 units with breakfast in am and 35 units with supper 04/03/19   [name], MD  loperamide (IMODIUM A-D) 2 MG tablet Take 1 tablet (2 mg total) by mouth 4 (four) times daily as needed for diarrhea or loose stools. 04/19/19   [name], MD  ondansetron (ZOFRAN) 4 MG tablet Take 1 tablet (4 mg total) by mouth every 6 (six) hours as needed for nausea. 04/03/19   [name], MD  pantoprazole (PROTONIX) 40 MG tablet Take 1 tablet (40 mg total) by mouth 2 (two) times daily before a  meal. 04/03/19   [name], MD    Physical Exam: BP (!) 124/93 (BP Location: Right Arm)   Pulse 66   Temp 97.7 F (36.5 C) (Oral)   Resp 20   Ht 5\' 8"  (1.727 m)   Wt 56 kg   SpO2 99%   BMI 18.77 kg/m   . General: 43 y.o. year-old male well developed well nourished in no acute distress.  Alert and oriented x3. [name] HENT: Normocephalic and atraumatic, pupils equal round and reactive to light and accommodation, no scleral icterus . Neck: Normal range of motion.  Neck supple.  No JVD . Cardiovascular: Regular rate and rhythm with no rubs or gallops.  No thyromegaly or JVD noted.  No lower extremity edema. 2/4 pulses in all 4 extremities. [name] Respiratory: Rales more audible in LLL. Clear to auscultation with no wheezes. Good inspiratory effort. . Abdomen: Soft nontender nondistended with normal bowel sounds x4 quadrants. . Muskuloskeletal: No cyanosis, clubbing or edema noted bilaterally . Neuro: CN II-XII intact, strength, sensation, reflexes . Skin: No ulcerative lesions noted or rashes . Psychiatry: Judgement and insight appear normal. Mood is appropriate for condition and setting          Labs on Admission:  Basic Metabolic Panel: Recent Labs  Lab 04/19/19 0300 04/19/19 2048 04/20/19 1108 04/23/19 1520 04/24/19 0618 04/25/19 0500  NA 140 139 138 139 140 137  K 3.2* 3.3* 3.5 3.6 3.5 3.2*  CL 102 102 101 102 111 107  CO2 27  28 27 29 23 23   GLUCOSE 191* 190* 154* 202* 216* 227*  BUN 27* 27* 21* 17 16 10   CREATININE 1.33* 1.34* 1.16 1.32* 1.14 1.23  CALCIUM 8.0* 7.9* 7.9* 8.1* 7.6* 7.6*  MG 1.5*  --  1.6*  --   --  1.5*  PHOS  --   --   --   --   --  1.6*   Liver Function Tests: Recent Labs  Lab 04/19/19 0300 04/20/19 1108 04/23/19 1520 04/25/19 0500  AST 14* 33 15 16  ALT 15 24 15 14   ALKPHOS 70 63 74 70  BILITOT 0.2* 0.2* 0.8 0.1*  PROT 6.6 5.7* 6.4* 5.4*  ALBUMIN 2.7* 2.4* 2.8* 2.3*   Recent Labs  Lab 04/23/19 1520  LIPASE 54*   No results for  input(s): AMMONIA in the last 168 hours. CBC: Recent Labs  Lab 04/19/19 0300 04/19/19 2048 04/20/19 1108 04/23/19 1520 04/24/19 0618 04/25/19 0500 04/25/19 0818  WBC 9.5 7.0 4.8 4.2 4.7 4.0  --   NEUTROABS 8.9*  --  3.1  --   --  1.9  --   HGB 8.8* 7.9* 8.2* 8.1* 7.6* 6.9* 7.1*  HCT 27.6* 24.6* 25.5* 25.7* 24.5* 21.8* 22.7*  MCV 95.2 95.0 96.6 96.3 97.2 96.9  --   PLT 399 364 429* 455* 362 355  --    Cardiac Enzymes: Recent Labs  Lab 04/25/19 0500  CKTOTAL 20*    BNP (last 3 results) Recent Labs    04/19/19 0300 04/20/19 0500 04/20/19 1108  BNP 450.9* SPECIMEN(S) LOST IN TRANSIT DUPLICATE  559.7*   ProBNP (last 3 results) No results for input(s): PROBNP in the last 8760 hours.  CBG: Recent Labs  Lab 04/24/19 1724 04/24/19 2250 04/25/19 0305 04/25/19 0843 04/25/19 1259  GLUCAP 157* 217* 272* 161* 224*    Radiological Exams on Admission: Dg Chest Port 1 View  Result Date: 04/23/2019 CLINICAL DATA:  Two-day history of nausea and vomiting. Current history of diabetes. COVID-19 admission on 03/31/2019. EXAM: PORTABLE CHEST 1 VIEW COMPARISON:  04/16/2019 and earlier. FINDINGS: Stable ground-glass airspace opacity involving the LATERAL RIGHT UPPER LOBE. Recurrent patchy opacities in the LEFT mid lung and LEFT lung base. No confluent airspace consolidation. No visible pleural effusions. IMPRESSION: Recurrent patchy bronchopneumonia involving the LEFT mid lung and LEFT lung base. Stable ground-glass pneumonia involving the RIGHT UPPER LOBE. Electronically Signed   By: Evangeline Dakin M.D.   On: 04/23/2019 15:58    Little Ishikawa MD [facility]  If 7PM-7AM, please contact night-coverage [email or web address]   04/25/2019, 2:00 PM

## 2019-04-25 NOTE — Progress Notes (Addendum)
Noted that pt had not voided on shift, I asked the pt if he wanted to use the bathroom, pt stated that he did not feel the need to urinate in Spanish. I encouraged the pt to stand up and go to the bathroom, pt refused and got upset. NT and this nurse bladder scanned the pt and his volume was > than 999 mls. I encouraged the pt one more time to get up, walk to the bathroom, and try to urinate; this time the pt was angry and cursing at staff but went to the bathroom. He was able to urinate with no problem. Refuses to put on socks and also refuses bed alarm. Does not try to help with his ADLs.

## 2019-04-25 NOTE — Plan of Care (Signed)

## 2019-04-26 DIAGNOSIS — U071 COVID-19: Secondary | ICD-10-CM | POA: Diagnosis not present

## 2019-04-26 DIAGNOSIS — D638 Anemia in other chronic diseases classified elsewhere: Secondary | ICD-10-CM | POA: Diagnosis not present

## 2019-04-26 DIAGNOSIS — I959 Hypotension, unspecified: Secondary | ICD-10-CM | POA: Diagnosis not present

## 2019-04-26 DIAGNOSIS — N179 Acute kidney failure, unspecified: Secondary | ICD-10-CM | POA: Diagnosis not present

## 2019-04-26 LAB — CBC WITH DIFFERENTIAL/PLATELET
Abs Immature Granulocytes: 0.02 10*3/uL (ref 0.00–0.07)
Basophils Absolute: 0 10*3/uL (ref 0.0–0.1)
Basophils Relative: 0 %
Eosinophils Absolute: 0.1 10*3/uL (ref 0.0–0.5)
Eosinophils Relative: 2 %
HCT: 23.6 % — ABNORMAL LOW (ref 39.0–52.0)
Hemoglobin: 7.6 g/dL — ABNORMAL LOW (ref 13.0–17.0)
Immature Granulocytes: 1 %
Lymphocytes Relative: 48 %
Lymphs Abs: 1.9 10*3/uL (ref 0.7–4.0)
MCH: 31.1 pg (ref 26.0–34.0)
MCHC: 32.2 g/dL (ref 30.0–36.0)
MCV: 96.7 fL (ref 80.0–100.0)
Monocytes Absolute: 0.3 10*3/uL (ref 0.1–1.0)
Monocytes Relative: 8 %
Neutro Abs: 1.6 10*3/uL — ABNORMAL LOW (ref 1.7–7.7)
Neutrophils Relative %: 41 %
Platelets: 385 10*3/uL (ref 150–400)
RBC: 2.44 MIL/uL — ABNORMAL LOW (ref 4.22–5.81)
RDW: 14.2 % (ref 11.5–15.5)
WBC: 4 10*3/uL (ref 4.0–10.5)
nRBC: 0 % (ref 0.0–0.2)

## 2019-04-26 LAB — CK: Total CK: 22 U/L — ABNORMAL LOW (ref 49–397)

## 2019-04-26 LAB — COMPREHENSIVE METABOLIC PANEL
ALT: 14 U/L (ref 0–44)
AST: 19 U/L (ref 15–41)
Albumin: 2.5 g/dL — ABNORMAL LOW (ref 3.5–5.0)
Alkaline Phosphatase: 78 U/L (ref 38–126)
Anion gap: 6 (ref 5–15)
BUN: 8 mg/dL (ref 6–20)
CO2: 23 mmol/L (ref 22–32)
Calcium: 8 mg/dL — ABNORMAL LOW (ref 8.9–10.3)
Chloride: 108 mmol/L (ref 98–111)
Creatinine, Ser: 1.24 mg/dL (ref 0.61–1.24)
GFR calc Af Amer: >60 mL/min (ref >60)
GFR calc non Af Amer: >60 mL/min (ref >60)
Glucose, Bld: 238 mg/dL — ABNORMAL HIGH (ref 70–99)
Potassium: 4.6 mmol/L (ref 3.5–5.1)
Sodium: 137 mmol/L (ref 135–145)
Total Bilirubin: 0.5 mg/dL (ref 0.3–1.2)
Total Protein: 5.9 g/dL — ABNORMAL LOW (ref 6.5–8.1)

## 2019-04-26 LAB — GLUCOSE, CAPILLARY
Glucose-Capillary: 237 mg/dL — ABNORMAL HIGH (ref 70–99)
Glucose-Capillary: 228 mg/dL — ABNORMAL HIGH (ref 70–99)
Glucose-Capillary: 185 mg/dL — ABNORMAL HIGH (ref 70–99)
Glucose-Capillary: 264 mg/dL — ABNORMAL HIGH (ref 70–99)

## 2019-04-26 LAB — ABO/RH: ABO/RH(D): O POS

## 2019-04-26 LAB — INTERLEUKIN-6, PLASMA: Interleukin-6, Plasma: 12.8 pg/mL — ABNORMAL HIGH (ref 0.0–12.2)

## 2019-04-26 LAB — C-REACTIVE PROTEIN: CRP: <0.8 mg/dL (ref <1.0)

## 2019-04-26 LAB — FERRITIN: Ferritin: 292 ng/mL (ref 24–336)

## 2019-04-26 LAB — D-DIMER, QUANTITATIVE: D-Dimer, Quant: 2.57 ug/mL-FEU — ABNORMAL HIGH (ref 0.00–0.50)

## 2019-04-26 MED ORDER — MIDODRINE HCL 10 MG PO TABS: 10.0000 mg | ORAL_TABLET | Freq: Three times a day (TID) | ORAL | 0 refills | Status: AC

## 2019-04-26 MED ORDER — METOCLOPRAMIDE HCL 10 MG PO TABS
10.0000 mg | ORAL_TABLET | ORAL | Status: AC
  Administered 2019-04-26: 10 mg via ORAL
  Filled 2019-04-26: qty 1

## 2019-04-26 MED ORDER — MIDODRINE HCL 5 MG PO TABS
10.0000 mg | ORAL_TABLET | Freq: Three times a day (TID) | ORAL | Status: DC
  Administered 2019-04-26 (×3): 10 mg via ORAL
  Filled 2019-04-26 (×6): qty 2

## 2019-04-26 MED ORDER — METOCLOPRAMIDE HCL 10 MG PO TABS: 10.0000 mg | ORAL_TABLET | Freq: Three times a day (TID) | ORAL | 1 refills | Status: DC

## 2019-04-26 NOTE — TOC Transition Note (Addendum)
Transition of Care [facility]) - CM/SW Discharge Note   Patient Details  Name: Gregory Moody MRN: [id number] Date of Birth: [birth year]  [facility] Care [facility] CM/SW Contact:  Maryclare Labrador, RN Phone Number: 04/26/2019, 2:08 PM   Clinical Narrative:   Pt discharge back home.  Pt is established with [facility] - receives medications with medication assistance at pharmacy clinic.  CM contacted pharmacy and pt can pick prescriptions tomorrow once pts PCP can approve them (policy for pharmacy and medication assistance).  Bedside nurse to ensure pt gets today/s dose of discharge prescriptions.  Pt missed PCP appt due to rehospitalization - see AVS. CM communicated above information with pt via interpretor services.  CM requested resumption orders prior to discharge for [place] aware pt is admitted and will discharge home today.   Pt received thermometer during last admit.  NO other CM needs determined - CM signing off  CM updated 15:45:  [facility] will not accept pt back as pt previously refused multiple times in the past.  CM contacted charity agency of the week [facility] - pt accepted  Final next level of care: Home/Self Care Barriers to Discharge: Barriers Resolved   Patient Goals and CMS Choice   CMS Medicare.gov Compare Post Acute Care list provided to:: Patient Choice offered to / list presented to : Patient  Discharge Placement                       Discharge Plan and Services                          HH Arranged: RN, PT, Social Work(resumption orders) [facility] Agency: [place] ([facility]) Date [place]: 04/26/19 Time [place]: [place] Representative spoke with at [place]: [place] ([place]) Interventions     Readmission Risk Interventions Readmission Risk Prevention Plan 04/19/2019 03/07/2019  Transportation Screening Complete Complete  PCP or Specialist Appt within 3-5 Days  - Complete  Social Work Consult for [place] Planning/Counseling - (No Data)  Palliative Care Screening - Not Applicable  Medication Review Press photographer) Complete Complete  PCP or Specialist appointment within 3-5 days of discharge Complete -  [place] or [place] Complete -  SW Recovery Care/Counseling Consult Complete -  Palliative Care Screening Not Applicable -  [place] Not Applicable -  Some recent data might be hidden

## 2019-04-26 NOTE — Discharge Summary (Signed)
Physician Discharge Summary  Gregory Moody MWN:[id number] DOB: [birth year] DOA: 04/24/2019  PCP: [place] date: 04/24/2019 Discharge date: 04/26/2019  Admitted From: Home Disposition: Home  Recommendations for Outpatient Follow-up:  1. Follow up with PCP in 1-2 weeks 2. Please obtain BMP/CBC in one week  Discharge Condition: Guarded CODE STATUS: Full Diet recommendation: As tolerated  Brief/Interim Summary: Gregory Moody is a 43 y.o. male with medical history significant for COVID-19, type 2 diabetes mellitus, diabetic gastroparesis and GERD who presents to [facility] ED yesterday due to several days of nausea and vomiting.  Patient was recently diagnosed with COVID-19 and was discharged from this hospital on 7/4 after being treated for similar presentation (nausea, vomiting, generalized weakness and hypotension). In the ED, patient was noted to be hypotensive (65/36), IV hydration was provided and patient failure responded well to IV fluids.  Patient was also noted to be anemic and presented with hypomagnesemia and acute kidney injury.  Chest x-ray showed recurrent patchy bronchopneumonia involving the left midlung and left lung base. Stable groundglass pneumonia involving the right upper lobe was noted. He was admitted to [facility] overnight and patient was transferred to this facility for further management.  Patient admitted as above with remarkably poor p.o. intake, recently diagnosed as COVID positive at our facility and discharged due to hypotension, poor p.o. intake intractable nausea vomiting.  Patient was admitted for similar episode, blood pressure quite low at [facility] emergency department at 65/36 although patient remained socially asymptomatic per discussion.  Regardless patient was admitted to our facility at [facility] for further evaluation and treatment, patient received IV fluids, was initiated on [place], now tolerating p.o. without any difficulty,  previous AKI has resolved. Patient's hemoglobin was borderline low however this is improved without transfusion. At this time patient is otherwise stable and agreeable for discharge home.  Patient would benefit from further outpatient evaluation and treatment with PCP, per discussion with family the patient is a remarkably poor eater, will not make his own food and only eat food prepared by others for him although he is physically capable of making and preparing his own meals he refuses to.  Unclear etiology although patient psych evaluation would not be unreasonable given his labile mood here in the hospital.  Discharge Diagnoses:  Principal Problem:   Hypotension Active Problems:   Hyperglycemia   Diabetes mellitus with hyperglycemia (HCC)   GERD (gastroesophageal reflux disease)   Diabetic gastroparesis (HCC)   AKI (acute kidney injury) (HCC)   Nausea and vomiting   COVID-19 virus infection   Anemia of chronic disease   Hypomagnesemia  Discharge Instructions Please take all medications as described, remain in quarantine as discussed at previous discharge.  All up with PCP for further evaluation and treatment scheduled, continue to increase diet as tolerated.  Increase free water as well.  Allergies as of 04/26/2019   No Known Allergies     Medication List    STOP taking these medications   doxycycline 100 MG capsule Commonly known as: VIBRAMYCIN     TAKE these medications   aspirin EC 81 MG tablet Take 81 mg by mouth daily.   insulin NPH-regular Human (70-30) 100 UNIT/ML injection Inject 20 Units into the skin 2 (two) times daily with a meal. 25 units with breakfast in am and 35 units with supper   loperamide 2 MG tablet Commonly known as: Imodium A-D Take 1 tablet (2 mg total) by mouth 4 (four) times daily as needed for diarrhea or  loose stools.   metoCLOPramide 10 MG tablet Commonly known as: REGLAN Take 1 tablet (10 mg total) by mouth 3 (three) times daily with  meals.   midodrine 10 MG tablet Commonly known as: PROAMATINE Take 1 tablet (10 mg total) by mouth 3 (three) times daily with meals.   ondansetron 4 MG tablet Commonly known as: ZOFRAN Take 1 tablet (4 mg total) by mouth every 6 (six) hours as needed for nausea.   pantoprazole 40 MG tablet Commonly known as: PROTONIX Take 1 tablet (40 mg total) by mouth 2 (two) times daily before a meal.      Procedures/Studies: Ct Abdomen Pelvis Wo Contrast  Result Date: 04/17/2019 CLINICAL DATA:  Abdominal pain, nausea, vomiting, and diarrhea. Acute renal failure. COVID-19 positive. History of diabetes. EXAM: CT ABDOMEN AND PELVIS WITHOUT CONTRAST TECHNIQUE: Multidetector CT imaging of the abdomen and pelvis was performed following the standard protocol without IV contrast. COMPARISON:  03/17/2019 FINDINGS: Lower chest: Mild bibasilar atelectasis and/or scarring. No pleural effusion. Normal heart size. Hepatobiliary: No focal liver abnormality is seen. There is new hyperdense material in an underdistended gallbladder which may reflect sludge. There are no regional inflammatory changes. No biliary dilatation is seen. Pancreas: Unremarkable. Spleen: Unremarkable. Adrenals/Urinary Tract: Unremarkable adrenal glands. No renal calculi or hydronephrosis. Decompressed bladder with a Foley catheter in place. Stomach/Bowel: The stomach is unremarkable. There is no evidence of bowel obstruction. Liquid stool is present in the rectum consistent with the history of a diarrheal illness. Mild sigmoid colon diverticulosis is noted without evidence of diverticulitis. The appendix is unremarkable. Vascular/Lymphatic: Normal caliber of the abdominal aorta. No enlarged lymph nodes. Reproductive: Unremarkable prostate. Other: No intraperitoneal free fluid or pneumoperitoneum. Mild stranding and small foci of gas in the anterior abdominal wall likely reflective of subcutaneous medication injection. Musculoskeletal: No acute osseous  abnormality or suspicious osseous lesion. Suspected healed pars defects bilaterally at L5 with unchanged grade 1 anterolisthesis of L5 on S1. Old fractures of the left ischium and proximal left femur. IMPRESSION: 1. No acute abnormality identified in the abdomen or pelvis. 2. Liquid stool in the rectum consistent with the history of a diarrheal illness. 3. Suspected gallbladder sludge. 4. Mild sigmoid colon diverticulosis without evidence of diverticulitis. Electronically Signed   By: Logan Bores M.D.   On: 04/17/2019 13:50   Dg Chest 2 View  Result Date: 03/30/2019 CLINICAL DATA:  Hypothermia EXAM: CHEST - 2 VIEW COMPARISON:  03/17/2019, 06/20/2018, CT 02/13/2019 FINDINGS: Right upper lobe ovoid nodular opacity as before. No new ac airspace disease or effusion. Stable cardiomediastinal silhouette. No pneumothorax. IMPRESSION: No active cardiopulmonary disease. Stable right upper lobe nodular opacity. Electronically Signed   By: Donavan Foil M.D.   On: 03/30/2019 22:53   Dg Abd 1 View  Result Date: 04/14/2019 CLINICAL DATA:  History of hydronephrosis. EXAM: ABDOMEN - 1 VIEW COMPARISON:  CT abdomen pelvis 03/17/2019 FINDINGS: Paucity of small bowel gas. Gas within the stomach. Left lung bases clear. Osseous structures unremarkable. Supine evaluation limited for the detection of free intraperitoneal air. Chronic changes proximal left femur/greater trochanter. IMPRESSION: Paucity of small bowel gas without evidence for overt small bowel obstruction. Electronically Signed   By: Lovey Newcomer M.D.   On: 04/14/2019 17:46   US Renal  Result Date: 04/15/2019 CLINICAL DATA:  Acute kidney injury. EXAM: RENAL / URINARY TRACT ULTRASOUND COMPLETE COMPARISON:  CT abdomen 03/17/2019 FINDINGS: Right Kidney: Renal measurements: 12.4 x 4.9 x 6.2 cm = volume: 194 mL . Echogenicity within normal  limits. No mass or hydronephrosis visualized. Left Kidney: Renal measurements: 11.5 x 5.1 x 5.5 cm = volume: 169 mL. Echogenicity  within normal limits. No mass or hydronephrosis visualized. Bladder: Foley catheter in place. The bladder is distended with urine, suggesting the catheter is not functioning. Floor nurse notified of this finding at the time of the scan. IMPRESSION: Normal appearance of the kidneys. Normal size and echogenicity. No hydronephrosis. Foley catheter in the bladder, but the bladder is full of urine, suggesting the catheter is not functioning. Floor nurse notified at the time of the scan. Electronically Signed   By: Nelson Chimes M.D.   On: 04/15/2019 06:58   Dg Chest Port 1 View  Result Date: 04/23/2019 CLINICAL DATA:  Two-day history of nausea and vomiting. Current history of diabetes. COVID-19 admission on 03/31/2019. EXAM: PORTABLE CHEST 1 VIEW COMPARISON:  04/16/2019 and earlier. FINDINGS: Stable ground-glass airspace opacity involving the LATERAL RIGHT UPPER LOBE. Recurrent patchy opacities in the LEFT mid lung and LEFT lung base. No confluent airspace consolidation. No visible pleural effusions. IMPRESSION: Recurrent patchy bronchopneumonia involving the LEFT mid lung and LEFT lung base. Stable ground-glass pneumonia involving the RIGHT UPPER LOBE. Electronically Signed   By: Evangeline Dakin M.D.   On: 04/23/2019 15:58   Dg Chest Port 1 View  Result Date: 04/16/2019 CLINICAL DATA:  43 year old male with pulmonary opacities presenting for follow-up. EXAM: PORTABLE CHEST 1 VIEW COMPARISON:  Earlier radiograph dated 04/15/2019 and chest radiograph dated 07/03 2 FINDINGS: Left lung base streaky densities appear slightly improved since the earlier radiograph of 04/15/2019. Stable area of opacity in the right upper lobe along the minor fissure again noted. No pleural effusion or pneumothorax. The cardiac silhouette is within normal limits. No acute osseous pathology. IMPRESSION: Overall slight interval improvement of bibasilar densities since the earlier radiograph. Electronically Signed   By: Anner Crete M.D.    On: 04/16/2019 14:16   Portable Chest 1 View  Result Date: 04/15/2019 CLINICAL DATA:  Shortness of breath.  COVID-19 positive. EXAM: PORTABLE CHEST 1 VIEW COMPARISON:  Chest x-ray dated April 13, 2019. FINDINGS: The heart size and mediastinal contours are within normal limits. Normal pulmonary vascularity. Mild patchy opacity at the left greater than right lung bases, more prominent when compared to prior study. No pleural effusion or pneumothorax. Chronic masslike opacity in the peripheral right upper lobe is unchanged. IMPRESSION: 1. Mild bibasilar patchy opacities, likely reflecting COVID-19 pneumonia. Electronically Signed   By: Titus Dubin M.D.   On: 04/15/2019 09:38   Dg Chest Port 1 View  Result Date: 04/13/2019 CLINICAL DATA:  Chest discomfort.  History of tuberculosis. EXAM: PORTABLE CHEST 1 VIEW COMPARISON:  March 30, 2019 FINDINGS: The nodular density in the right upper lung is stable and chronic. There is mild volume loss in the right upper lobe. The heart and mediastinum is stable. There is some increased opacity in the region of the left perihilar region, not seen previously. No other changes. IMPRESSION: Suspected developing subtle infiltrate in the left perihilar region. A PA and lateral chest x-ray may better evaluate. Alternatively, recommend attention on follow-up. No other interval change. Electronically Signed   By: Dorise Bullion III M.D   On: 04/13/2019 22:55   Dg Knee Complete 4 Views Left  Result Date: 03/31/2019 CLINICAL DATA:  Abscess. Open wound anterior left knee. EXAM: LEFT KNEE - COMPLETE 4+ VIEW COMPARISON:  Radiographs 2 weeks ago 03/17/2019 FINDINGS: No evidence of fracture, dislocation, or joint effusion. No bony destructive  change or periosteal reaction. Soft tissue edema anteriorly with skin irregularity overlying the patella. No tracking soft tissue air. No radiopaque foreign body. IMPRESSION: 1. Soft tissue edema anterior to the patella. No radiopaque foreign body  or osseous abnormality. 2. No radiographic evidence of osteomyelitis. Electronically Signed   By: Keith Rake M.D.   On: 03/31/2019 01:13    Subjective: No acute issues or events overnight, patient continues to be somewhat positive on orthostatic vital signs but remains asymptomatic, this is likely his baseline.  Is requesting discharge home which is certainly reasonable given his resolution of AKI, intractable nausea vomiting now tolerating p.o. well without difficulty.   Discharge Exam: Vitals:   04/25/19 1939 04/26/19 0531  BP: (!) 135/94 100/74  Pulse: 65 64  Resp: 16 18  Temp: 97.7 F (36.5 C) 98.1 F (36.7 C)  SpO2: 100% 96%   Vitals:   04/25/19 1242 04/25/19 1301 04/25/19 1939 04/26/19 0531  BP: (!) 69/40 (!) 124/93 (!) 135/94 100/74  Pulse:   65 64  Resp:   16 18  Temp:  97.7 F (36.5 C) 97.7 F (36.5 C) 98.1 F (36.7 C)  TempSrc:  Oral Oral Oral  SpO2:   100% 96%  Weight:      Height:        General:  Pleasantly resting in bed, No acute distress. HEENT:  Normocephalic atraumatic.  Sclerae nonicteric, noninjected.  Extraocular movements intact bilaterally. Neck:  Without mass or deformity.  Trachea is midline. Lungs:  Clear to auscultate bilaterally without rhonchi, wheeze, or rales. Heart:  Regular rate and rhythm.  Without murmurs, rubs, or gallops. Abdomen:  Soft, nontender, nondistended.  Without guarding or rebound. Extremities: Without cyanosis, clubbing, edema, or obvious deformity. Vascular:  Dorsalis pedis and posterior tibial pulses palpable bilaterally. Skin:  Warm and dry, no erythema, no ulcerations.   The results of significant diagnostics from this hospitalization (including imaging, microbiology, ancillary and laboratory) are listed below for reference.     Microbiology: Recent Results (from the past 240 hour(s))  Aerobic Culture (superficial specimen)     Status: None   Collection Time: 04/17/19 10:08 AM   Specimen: KNEE; Wound  Result  Value Ref Range Status   Specimen Description KNEE LOOK FOR MRSA  Final   Special Requests Immunocompromised  Final   Gram Stain   Final    NO WBC SEEN RARE GRAM POSITIVE COCCI IN PAIRS Performed at [facility], [address], [place], [place] [address]    Culture   Final    FEW METHICILLIN RESISTANT STAPHYLOCOCCUS AUREUS RARE ENTEROBACTER CLOACAE    Report Status 04/19/2019 FINAL  Final   Organism ID, Bacteria ENTEROBACTER CLOACAE  Final   Organism ID, Bacteria METHICILLIN RESISTANT STAPHYLOCOCCUS AUREUS  Final      Susceptibility   Enterobacter cloacae - MIC*    CEFAZOLIN >=64 RESISTANT Resistant     CEFEPIME <=1 SENSITIVE Sensitive     CEFTAZIDIME >=64 RESISTANT Resistant     CEFTRIAXONE >=64 RESISTANT Resistant     CIPROFLOXACIN <=0.25 SENSITIVE Sensitive     GENTAMICIN <=1 SENSITIVE Sensitive     IMIPENEM <=0.25 SENSITIVE Sensitive     TRIMETH/SULFA <=20 SENSITIVE Sensitive     PIP/TAZO >=128 RESISTANT Resistant     * RARE ENTEROBACTER CLOACAE   Methicillin resistant staphylococcus aureus - MIC*    CIPROFLOXACIN <=0.5 SENSITIVE Sensitive     ERYTHROMYCIN <=0.25 SENSITIVE Sensitive     GENTAMICIN <=0.5 SENSITIVE Sensitive  OXACILLIN RESISTANT Resistant     TETRACYCLINE <=1 SENSITIVE Sensitive     VANCOMYCIN 1 SENSITIVE Sensitive     TRIMETH/SULFA <=10 SENSITIVE Sensitive     CLINDAMYCIN >=8 RESISTANT Resistant     RIFAMPIN <=0.5 SENSITIVE Sensitive     Inducible Clindamycin NEGATIVE Sensitive     * FEW METHICILLIN RESISTANT STAPHYLOCOCCUS AUREUS  Gastrointestinal Panel by PCR , Stool     Status: None   Collection Time: 04/17/19 10:29 AM   Specimen: Stool  Result Value Ref Range Status   Campylobacter species NOT DETECTED NOT DETECTED Final   Plesimonas shigelloides NOT DETECTED NOT DETECTED Final   Salmonella species NOT DETECTED NOT DETECTED Final   Yersinia enterocolitica NOT DETECTED NOT DETECTED Final   Vibrio species NOT DETECTED NOT DETECTED Final    Vibrio cholerae NOT DETECTED NOT DETECTED Final   Enteroaggregative E coli (EAEC) NOT DETECTED NOT DETECTED Final   Enteropathogenic E coli (EPEC) NOT DETECTED NOT DETECTED Final   Enterotoxigenic E coli (ETEC) NOT DETECTED NOT DETECTED Final   Shiga like toxin producing E coli (STEC) NOT DETECTED NOT DETECTED Final   Shigella/Enteroinvasive E coli (EIEC) NOT DETECTED NOT DETECTED Final   Cryptosporidium NOT DETECTED NOT DETECTED Final   Cyclospora cayetanensis NOT DETECTED NOT DETECTED Final   Entamoeba histolytica NOT DETECTED NOT DETECTED Final   Giardia lamblia NOT DETECTED NOT DETECTED Final   Adenovirus F40/41 NOT DETECTED NOT DETECTED Final   Astrovirus NOT DETECTED NOT DETECTED Final   Norovirus GI/GII NOT DETECTED NOT DETECTED Final   Rotavirus A NOT DETECTED NOT DETECTED Final   Sapovirus (I, II, IV, and V) NOT DETECTED NOT DETECTED Final    Comment: Performed at [facility], [address], [place], [place] [address]  Urine culture     Status: Abnormal   Collection Time: 04/23/19  3:34 PM   Specimen: In/Out Cath Urine  Result Value Ref Range Status   Specimen Description   Final    IN/OUT CATH URINE Performed at [facility], [address], [place], [place] [address]    Special Requests   Final    NONE Performed at [facility], [address] [place], [place] [address]    Culture 10,000 COLONIES/mL YEAST (A)  Final   Report Status 04/25/2019 FINAL  Final  Blood Culture (routine x 2)     Status: None (Preliminary result)   Collection Time: 04/23/19  4:08 PM   Specimen: BLOOD  Result Value Ref Range Status   Specimen Description BLOOD BLOOD LEFT HAND  Final   Special Requests   Final    BOTTLES DRAWN AEROBIC AND ANAEROBIC Blood Culture adequate volume   Culture   Final    NO GROWTH 3 DAYS Performed at [facility], [address] [place], [place] [address]    Report Status PENDING  Incomplete  Blood Culture (routine x  2)     Status: None (Preliminary result)   Collection Time: 04/23/19  4:08 PM   Specimen: BLOOD  Result Value Ref Range Status   Specimen Description BLOOD LEFT ANTECUBITAL  Final   Special Requests   Final    BOTTLES DRAWN AEROBIC AND ANAEROBIC Blood Culture adequate volume   Culture   Final    NO GROWTH 3 DAYS Performed at [facility], [address], [place], [place] [address]    Report Status PENDING  Incomplete  SARS Coronavirus 2 (CEPHEID - Performed in [place] hospital lab), [facility]  Order     Status: Abnormal   Collection Time: 04/23/19  4:08 PM   Specimen: Nasopharyngeal Swab  Result Value Ref Range Status   SARS Coronavirus 2 POSITIVE (A) NEGATIVE Final    Comment: RESULT CALLED TO, READ BACK BY AND VERIFIED WITH: PAIGE JOHNSON @1738  04/23/19 AKT (NOTE) If result is NEGATIVE SARS-CoV-2 target nucleic acids are NOT DETECTED. The SARS-CoV-2 RNA is generally detectable in upper and lower  respiratory specimens during the acute phase of infection. The lowest  concentration of SARS-CoV-2 viral copies this assay can detect is 250  copies / mL. A negative result does not preclude SARS-CoV-2 infection  and should not be used as the sole basis for treatment or other  patient management decisions.  A negative result may occur with  improper specimen collection / handling, submission of specimen other  than nasopharyngeal swab, presence of viral mutation(s) within the  areas targeted by this assay, and inadequate number of viral copies  (<250 copies / mL). A negative result must be combined with clinical  observations, patient history, and epidemiological information. If result is POSITIVE SARS-CoV-2 target nucleic acids are DETECTED. The S ARS-CoV-2 RNA is generally detectable in upper and lower  respiratory specimens during the acute phase of infection.  Positive  results are indicative of active infection with SARS-CoV-2.  Clinical  correlation with patient history  and other diagnostic information is  necessary to determine patient infection status.  Positive results do  not rule out bacterial infection or co-infection with other viruses. If result is PRESUMPTIVE POSTIVE SARS-CoV-2 nucleic acids MAY BE PRESENT.   A presumptive positive result was obtained on the submitted specimen  and confirmed on repeat testing.  While 2019 novel coronavirus  (SARS-CoV-2) nucleic acids may be present in the submitted sample  additional confirmatory testing may be necessary for epidemiological  and / or clinical management purposes  to differentiate between  SARS-CoV-2 and other Sarbecovirus currently known to infect humans.  If clinically indicated additional testing with an alternate test  methodology [phone]) is adv ised. The SARS-CoV-2 RNA is generally  detectable in upper and lower respiratory specimens during the acute  phase of infection. The expected result is Negative. Fact Sheet for Patients:  StrictlyIdeas.no Fact Sheet for Healthcare Providers: BankingDealers.co.za This test is not yet approved or cleared by the [place] FDA and has been authorized for detection and/or diagnosis of SARS-CoV-2 by FDA under an Emergency Use Authorization (EUA).  This EUA will remain in effect (meaning this test can be used) for the duration of the COVID-19 declaration under Section 564(b)(1) of the Act, 21 U.S.C. section 360bbb-3(b)(1), unless the authorization is terminated or revoked sooner. Performed at [facility], [address] [place], [place] [address]      Labs: BNP (last 3 results) Recent Labs    04/19/19 0300 04/20/19 0500 04/20/19 1108  BNP 450.9* SPECIMEN(S) LOST IN TRANSIT DUPLICATE  233.0*   Basic Metabolic Panel: Recent Labs  Lab 04/20/19 1108 04/23/19 1520 04/24/19 0618 04/25/19 0500 04/26/19 0400  NA 138 139 140 137 137  K 3.5 3.6 3.5 3.2* 4.6  CL 101 102 111 107 108   CO2 27 29 23 23 23   GLUCOSE 154* 202* 216* 227* 238*  BUN 21* 17 16 10 8   CREATININE 1.16 1.32* 1.14 1.23 1.24  CALCIUM 7.9* 8.1* 7.6* 7.6* 8.0*  MG 1.6*  --   --  1.5*  --   PHOS  --   --   --  1.6*  --    Liver Function Tests: Recent Labs  Lab 04/20/19 1108 04/23/19 1520 04/25/19 0500 04/26/19 0400  AST 33 15 16 19   ALT 24 15 14 14   ALKPHOS 63 74 70 78  BILITOT 0.2* 0.8 0.1* 0.5  PROT 5.7* 6.4* 5.4* 5.9*  ALBUMIN 2.4* 2.8* 2.3* 2.5*   Recent Labs  Lab 04/23/19 1520  LIPASE 54*   No results for input(s): AMMONIA in the last 168 hours. CBC: Recent Labs  Lab 04/20/19 1108 04/23/19 1520 04/24/19 0618 04/25/19 0500 04/25/19 0818 04/26/19 0400  WBC 4.8 4.2 4.7 4.0  --  4.0  NEUTROABS 3.1  --   --  1.9  --  1.6*  HGB 8.2* 8.1* 7.6* 6.9* 7.1* 7.6*  HCT 25.5* 25.7* 24.5* 21.8* 22.7* 23.6*  MCV 96.6 96.3 97.2 96.9  --  96.7  PLT 429* 455* 362 355  --  385   Cardiac Enzymes: Recent Labs  Lab 04/25/19 0500 04/26/19 0400  CKTOTAL 20* 22*   BNP: Invalid input(s): POCBNP CBG: Recent Labs  Lab 04/25/19 1259 04/25/19 1548 04/25/19 2110 04/26/19 0710 04/26/19 0751  GLUCAP 224* 143* 145* 237* 228*   D-Dimer Recent Labs    04/25/19 0500 04/26/19 0400  DDIMER 1.96* 2.57*   Hgb A1c No results for input(s): HGBA1C in the last 72 hours. Lipid Profile Recent Labs    04/25/19 0500  TRIG 186*   Thyroid function studies No results for input(s): TSH, T4TOTAL, T3FREE, THYROIDAB in the last 72 hours.  Invalid input(s): FREET3 Anemia work up Recent Labs    04/25/19 0500 04/26/19 0400  FERRITIN 269 292   Urinalysis    Component Value Date/Time   COLORURINE YELLOW (A) 04/23/2019 1520   APPEARANCEUR CLEAR (A) 04/23/2019 1520   LABSPEC 1.012 04/23/2019 1520   PHURINE 6.0 04/23/2019 1520   GLUCOSEU 150 (A) 04/23/2019 1520   HGBUR NEGATIVE 04/23/2019 1520   BILIRUBINUR NEGATIVE 04/23/2019 1520   KETONESUR 5 (A) 04/23/2019 1520   PROTEINUR NEGATIVE  04/23/2019 1520   NITRITE NEGATIVE 04/23/2019 1520   LEUKOCYTESUR NEGATIVE 04/23/2019 1520   Sepsis Labs Invalid input(s): PROCALCITONIN,  WBC,  LACTICIDVEN Microbiology Recent Results (from the past 240 hour(s))  Aerobic Culture (superficial specimen)     Status: None   Collection Time: 04/17/19 10:08 AM   Specimen: KNEE; Wound  Result Value Ref Range Status   Specimen Description KNEE LOOK FOR MRSA  Final   Special Requests Immunocompromised  Final   Gram Stain   Final    NO WBC SEEN RARE GRAM POSITIVE COCCI IN PAIRS Performed at [facility], [address], [place], [place] [address]    Culture   Final    FEW METHICILLIN RESISTANT STAPHYLOCOCCUS AUREUS RARE ENTEROBACTER CLOACAE    Report Status 04/19/2019 FINAL  Final   Organism ID, Bacteria ENTEROBACTER CLOACAE  Final   Organism ID, Bacteria METHICILLIN RESISTANT STAPHYLOCOCCUS AUREUS  Final      Susceptibility   Enterobacter cloacae - MIC*    CEFAZOLIN >=64 RESISTANT Resistant     CEFEPIME <=1 SENSITIVE Sensitive     CEFTAZIDIME >=64 RESISTANT Resistant     CEFTRIAXONE >=64 RESISTANT Resistant     CIPROFLOXACIN <=0.25 SENSITIVE Sensitive     GENTAMICIN <=1 SENSITIVE Sensitive     IMIPENEM <=0.25 SENSITIVE Sensitive     TRIMETH/SULFA <=20 SENSITIVE Sensitive     PIP/TAZO >=128 RESISTANT Resistant     * RARE ENTEROBACTER CLOACAE   Methicillin resistant  staphylococcus aureus - MIC*    CIPROFLOXACIN <=0.5 SENSITIVE Sensitive     ERYTHROMYCIN <=0.25 SENSITIVE Sensitive     GENTAMICIN <=0.5 SENSITIVE Sensitive     OXACILLIN RESISTANT Resistant     TETRACYCLINE <=1 SENSITIVE Sensitive     VANCOMYCIN 1 SENSITIVE Sensitive     TRIMETH/SULFA <=10 SENSITIVE Sensitive     CLINDAMYCIN >=8 RESISTANT Resistant     RIFAMPIN <=0.5 SENSITIVE Sensitive     Inducible Clindamycin NEGATIVE Sensitive     * FEW METHICILLIN RESISTANT STAPHYLOCOCCUS AUREUS  Gastrointestinal Panel by PCR , Stool     Status: None   Collection  Time: 04/17/19 10:29 AM   Specimen: Stool  Result Value Ref Range Status   Campylobacter species NOT DETECTED NOT DETECTED Final   Plesimonas shigelloides NOT DETECTED NOT DETECTED Final   Salmonella species NOT DETECTED NOT DETECTED Final   Yersinia enterocolitica NOT DETECTED NOT DETECTED Final   Vibrio species NOT DETECTED NOT DETECTED Final   Vibrio cholerae NOT DETECTED NOT DETECTED Final   Enteroaggregative E coli (EAEC) NOT DETECTED NOT DETECTED Final   Enteropathogenic E coli (EPEC) NOT DETECTED NOT DETECTED Final   Enterotoxigenic E coli (ETEC) NOT DETECTED NOT DETECTED Final   Shiga like toxin producing E coli (STEC) NOT DETECTED NOT DETECTED Final   Shigella/Enteroinvasive E coli (EIEC) NOT DETECTED NOT DETECTED Final   Cryptosporidium NOT DETECTED NOT DETECTED Final   Cyclospora cayetanensis NOT DETECTED NOT DETECTED Final   Entamoeba histolytica NOT DETECTED NOT DETECTED Final   Giardia lamblia NOT DETECTED NOT DETECTED Final   Adenovirus F40/41 NOT DETECTED NOT DETECTED Final   Astrovirus NOT DETECTED NOT DETECTED Final   Norovirus GI/GII NOT DETECTED NOT DETECTED Final   Rotavirus A NOT DETECTED NOT DETECTED Final   Sapovirus (I, II, IV, and V) NOT DETECTED NOT DETECTED Final    Comment: Performed at [facility], [address], [place], [place] [address]  Urine culture     Status: Abnormal   Collection Time: 04/23/19  3:34 PM   Specimen: In/Out Cath Urine  Result Value Ref Range Status   Specimen Description   Final    IN/OUT CATH URINE Performed at [facility], [address], [place], [place] [address]    Special Requests   Final    NONE Performed at [facility], [address] [place], [place] [address]    Culture 10,000 COLONIES/mL YEAST (A)  Final   Report Status 04/25/2019 FINAL  Final  Blood Culture (routine x 2)     Status: None (Preliminary result)   Collection Time: 04/23/19  4:08 PM   Specimen: BLOOD  Result Value  Ref Range Status   Specimen Description BLOOD BLOOD LEFT HAND  Final   Special Requests   Final    BOTTLES DRAWN AEROBIC AND ANAEROBIC Blood Culture adequate volume   Culture   Final    NO GROWTH 3 DAYS Performed at [facility], [address] [place], [place] [address]    Report Status PENDING  Incomplete  Blood Culture (routine x 2)     Status: None (Preliminary result)   Collection Time: 04/23/19  4:08 PM   Specimen: BLOOD  Result Value Ref Range Status   Specimen Description BLOOD LEFT ANTECUBITAL  Final   Special Requests   Final    BOTTLES DRAWN AEROBIC AND ANAEROBIC Blood Culture adequate volume   Culture   Final    NO GROWTH 3 DAYS Performed at  [facility], [address] [place], [place] [address]    Report Status PENDING  Incomplete  SARS Coronavirus 2 (CEPHEID - Performed in [facility] lab), Hosp Order     Status: Abnormal   Collection Time: 04/23/19  4:08 PM   Specimen: Nasopharyngeal Swab  Result Value Ref Range Status   SARS Coronavirus 2 POSITIVE (A) NEGATIVE Final    Comment: RESULT CALLED TO, READ BACK BY AND VERIFIED WITH: PAIGE JOHNSON @1738  04/23/19 AKT (NOTE) If result is NEGATIVE SARS-CoV-2 target nucleic acids are NOT DETECTED. The SARS-CoV-2 RNA is generally detectable in upper and lower  respiratory specimens during the acute phase of infection. The lowest  concentration of SARS-CoV-2 viral copies this assay can detect is 250  copies / mL. A negative result does not preclude SARS-CoV-2 infection  and should not be used as the sole basis for treatment or other  patient management decisions.  A negative result may occur with  improper specimen collection / handling, submission of specimen other  than nasopharyngeal swab, presence of viral mutation(s) within the  areas targeted by this assay, and inadequate number of viral copies  (<250 copies / mL). A negative result must be combined with clinical  observations, patient  history, and epidemiological information. If result is POSITIVE SARS-CoV-2 target nucleic acids are DETECTED. The S ARS-CoV-2 RNA is generally detectable in upper and lower  respiratory specimens during the acute phase of infection.  Positive  results are indicative of active infection with SARS-CoV-2.  Clinical  correlation with patient history and other diagnostic information is  necessary to determine patient infection status.  Positive results do  not rule out bacterial infection or co-infection with other viruses. If result is PRESUMPTIVE POSTIVE SARS-CoV-2 nucleic acids MAY BE PRESENT.   A presumptive positive result was obtained on the submitted specimen  and confirmed on repeat testing.  While 2019 novel coronavirus  (SARS-CoV-2) nucleic acids may be present in the submitted sample  additional confirmatory testing may be necessary for epidemiological  and / or clinical management purposes  to differentiate between  SARS-CoV-2 and other Sarbecovirus currently known to infect humans.  If clinically indicated additional testing with an alternate test  methodology [phone]) is adv ised. The SARS-CoV-2 RNA is generally  detectable in upper and lower respiratory specimens during the acute  phase of infection. The expected result is Negative. Fact Sheet for Patients:  StrictlyIdeas.no Fact Sheet for Healthcare Providers: BankingDealers.co.za This test is not yet approved or cleared by the [place] FDA and has been authorized for detection and/or diagnosis of SARS-CoV-2 by FDA under an Emergency Use Authorization (EUA).  This EUA will remain in effect (meaning this test can be used) for the duration of the COVID-19 declaration under Section 564(b)(1) of the Act, 21 U.S.C. section 360bbb-3(b)(1), unless the authorization is terminated or revoked sooner. Performed at [facility], [address], [place], [place]  [address]      Time coordinating discharge: Over 30 minutes  SIGNED:   Little Ishikawa, DO [facility] 04/26/2019, 11:03 AM Pager   If 7PM-7AM, please contact night-coverage [email or web address] Password [email or web address]

## 2019-04-26 NOTE — Evaluation (Signed)
Physical Therapy Evaluation Patient Details Name: Gregory Moody MRN: [id number] DOB: 04/04/1976 Today's Date: 04/26/2019   History of Present Illness  43 yo male presenting to [facility] ED with complaints of nausea, vomiting, and generalized weakness. Tested COVID positive 1 week ago. PMH including DM type ll with gastroparesis., CKD  Clinical Impression  The patient required  Time to mobilize, initially did not want to get up. Patient did transfer to the recliner. Will return after lunch to ambulated . Pt admitted with above diagnosis. Pt currently with functional limitations due to the deficits listed below (see PT Problem List). Pt will benefit from skilled PT to increase their independence and safety with mobility to allow discharge to the venue listed below.         Follow Up Recommendations Home health PT    Equipment Recommendations  None recommended by PT    Recommendations for Other Services       Precautions / Restrictions Precautions Precautions: Fall      Mobility  Bed Mobility Overal bed mobility: Modified Independent                Transfers Overall transfer level: Needs assistance Equipment used: None Transfers: Sit to/from Stand Sit to Stand: Min guard Stand pivot transfers: Min guard       General transfer comment: extra time, no dizziness , assisted to recliner  Ambulation/Gait                Stairs            Wheelchair Mobility    Modified Rankin (Stroke Patients Only)       Balance Overall balance assessment: Independent                                           Pertinent Vitals/Pain Faces Pain Scale: Hurts even more Pain Location: abdomen Pain Descriptors / Indicators: Grimacing Pain Intervention(s): Monitored during session    Home Living Family/patient expects to be discharged to:: Private residence Living Arrangements: Children;Other relatives Available Help at Discharge:  Family;Available PRN/intermittently Type of Home: Mobile home Home Access: Stairs to enter Entrance Stairs-Rails: Right;Left Entrance Stairs-Number of Steps: 3-4   Home Equipment: Walker - 2 wheels      Prior Function Level of Independence: Independent with assistive device(s);Independent         Comments: Pt reports he is uses RW for functional mobility. BADLs independently per pt     Hand Dominance        Extremity/Trunk Assessment   Upper Extremity Assessment Upper Extremity Assessment: Generalized weakness    Lower Extremity Assessment Lower Extremity Assessment: Generalized weakness       Communication   Communication: Prefers language other than English  Cognition Arousal/Alertness: Awake/alert Behavior During Therapy: Flat affect;WFL for tasks assessed/performed Overall Cognitive Status: Within Functional Limits for tasks assessed                                        General Comments      Exercises     Assessment/Plan    PT Assessment Patient needs continued PT services  PT Problem List Decreased strength;Decreased mobility;Decreased safety awareness;Decreased knowledge of precautions;Decreased activity tolerance;Cardiopulmonary status limiting activity;Decreased balance;Decreased knowledge of use of DME       PT Treatment  Interventions DME instruction;Therapeutic activities;Gait training;Functional mobility training;Therapeutic exercise;Patient/family education    PT Goals (Current goals can be found in the Care Plan section)  Acute Rehab PT Goals Patient Stated Goal: "Feel better", go home PT Goal Formulation: With patient Time For Goal Achievement: 05/03/19 Potential to Achieve Goals: Good    Frequency Min 3X/week   Barriers to discharge Decreased caregiver support      Co-evaluation PT/OT/SLP Co-Evaluation/Treatment: Yes             AM-PAC PT "6 Clicks" Mobility  Outcome Measure Help needed turning from your  back to your side while in a flat bed without using bedrails?: None Help needed moving from lying on your back to sitting on the side of a flat bed without using bedrails?: None Help needed moving to and from a bed to a chair (including a wheelchair)?: A Little Help needed standing up from a chair using your arms (e.g., wheelchair or bedside chair)?: A Little Help needed to walk in hospital room?: A Little Help needed climbing 3-5 steps with a railing? : A Little 6 Click Score: 20    End of Session   Activity Tolerance: Patient tolerated treatment well Patient left: in chair;with call bell/phone within reach;with chair alarm set Nurse Communication: Mobility status PT Visit Diagnosis: Difficulty in walking, not elsewhere classified (R26.2);Dizziness and giddiness (R42)    Time: 4142-3953 PT Time Calculation (min) (ACUTE ONLY): 23 min   Charges:   PT Evaluation $PT Eval Low Complexity: 1 Low PT Treatments $Therapeutic Activity: 8-22 mins        [place]  Office [phone]   Claretha Cooper 04/26/2019, 4:46 PM

## 2019-04-26 NOTE — Progress Notes (Signed)
Physical Therapy Treatment Patient Details Name: Gregory Moody MRN: [id number] DOB: 1975-10-31 Today's Date: 04/26/2019    History of Present Illness 43 yo male presenting to [facility] ED 7/14 with complaints of nausea, vomiting, and generalized weakness. Tested COVID positive 1 week ago. DC'd  from [facility] 7/9, PMH including DM type ll with gastroparesis., CKD    PT Comments    The patient ambulated without assistance and did  Well. Plans DC today.   Follow Up Recommendations  Home health PT     Equipment Recommendations  None recommended by PT    Recommendations for Other Services       Precautions / Restrictions Precautions Precautions: Fall    Mobility  Bed Mobility Overal bed mobility: Modified Independent                Transfers Overall transfer level: Needs assistance Equipment used: None Transfers: Sit to/from Stand Sit to Stand: Independent Stand pivot transfers: Min guard       General transfer comment: extra time, no dizziness , assisted to recliner  Ambulation/Gait Ambulation/Gait assistance: Min guard Gait Distance (Feet): 300 Feet Assistive device: None       General Gait Details: gait was steady   Marine scientist Rankin (Stroke Patients Only)       Balance Overall balance assessment: Independent                                          Cognition Arousal/Alertness: Awake/alert Behavior During Therapy: Flat affect;WFL for tasks assessed/performed Overall Cognitive Status: Within Functional Limits for tasks assessed                                        Exercises      General Comments        Pertinent Vitals/Pain Faces Pain Scale: Hurts even more Pain Location: abdomen Pain Descriptors / Indicators: Grimacing Pain Intervention(s): Monitored during session    Home Living Family/patient expects to be discharged to:: Private  residence Living Arrangements: Children;Other relatives Available Help at Discharge: Family;Available PRN/intermittently Type of Home: Mobile home Home Access: Stairs to enter Entrance Stairs-Rails: Right;Left   Home Equipment: Walker - 2 wheels      Prior Function Level of Independence: Independent with assistive device(s);Independent      Comments: Pt reports he is uses RW for functional mobility. BADLs independently per pt   PT Goals (current goals can now be found in the care plan section) Acute Rehab PT Goals Patient Stated Goal: "Feel better", go home PT Goal Formulation: With patient Time For Goal Achievement: 05/03/19 Potential to Achieve Goals: Good Progress towards PT goals: Progressing toward goals    Frequency    Min 3X/week      PT Plan Current plan remains appropriate    Co-evaluation PT/OT/SLP Co-Evaluation/Treatment: Yes            AM-PAC PT "6 Clicks" Mobility   Outcome Measure  Help needed turning from your back to your side while in a flat bed without using bedrails?: None Help needed moving from lying on your back to sitting on the side of a flat bed without using bedrails?: None Help needed moving to and from a  bed to a chair (including a wheelchair)?: None Help needed standing up from a chair using your arms (e.g., wheelchair or bedside chair)?: None Help needed to walk in hospital room?: None Help needed climbing 3-5 steps with a railing? : None 6 Click Score: 24    End of Session Equipment Utilized During Treatment: Gait belt Activity Tolerance: Patient tolerated treatment well Patient left: in bed Nurse Communication: Mobility status PT Visit Diagnosis: Difficulty in walking, not elsewhere classified (R26.2);Dizziness and giddiness (R42)     Time: 5800-6349 PT Time Calculation (min) (ACUTE ONLY): 18 min  Charges:  $Gait Training: 8-22 mins                     [place] Pager  [phone] Office [phone]    Claretha Cooper 04/26/2019, 4:54 PM

## 2019-04-28 LAB — CULTURE, BLOOD (ROUTINE X 2)
Culture: NO GROWTH
Culture: NO GROWTH
Special Requests: ADEQUATE
Special Requests: ADEQUATE

## 2019-05-03 LAB — BLOOD GAS, VENOUS
Acid-Base Excess: 5.3 mmol/L — ABNORMAL HIGH (ref 0.0–2.0)
Bicarbonate: 31.1 mmol/L — ABNORMAL HIGH (ref 20.0–28.0)
O2 Saturation: 27.3 %
Patient temperature: 37
pCO2, Ven: 49 mmHg (ref 44.0–60.0)
pH, Ven: 7.41 (ref 7.250–7.430)

## 2019-05-27 ENCOUNTER — Encounter: Payer: Self-pay | Admitting: Emergency Medicine

## 2019-05-27 ENCOUNTER — Other Ambulatory Visit: Payer: Self-pay

## 2019-05-27 ENCOUNTER — Emergency Department
Admission: EM | Admit: 2019-05-27 | Discharge: 2019-05-28 | Disposition: A | Discharge status: Home / Self Care | Payer: Self-pay | Attending: Emergency Medicine | Admitting: Emergency Medicine

## 2019-05-27 DIAGNOSIS — E11649 Type 2 diabetes mellitus with hypoglycemia without coma: Secondary | ICD-10-CM | POA: Insufficient documentation

## 2019-05-27 DIAGNOSIS — E162 Hypoglycemia, unspecified: Secondary | ICD-10-CM

## 2019-05-27 DIAGNOSIS — E1143 Type 2 diabetes mellitus with diabetic autonomic (poly)neuropathy: Secondary | ICD-10-CM | POA: Insufficient documentation

## 2019-05-27 LAB — CBC WITH DIFFERENTIAL/PLATELET
Abs Immature Granulocytes: 0.01 10*3/uL (ref 0.00–0.07)
Basophils Absolute: 0 10*3/uL (ref 0.0–0.1)
Basophils Relative: 1 %
Eosinophils Absolute: 0.1 10*3/uL (ref 0.0–0.5)
Eosinophils Relative: 1 %
HCT: 24.3 % — ABNORMAL LOW (ref 39.0–52.0)
Hemoglobin: 8 g/dL — ABNORMAL LOW (ref 13.0–17.0)
Immature Granulocytes: 0 %
Lymphocytes Relative: 51 %
Lymphs Abs: 2.3 10*3/uL (ref 0.7–4.0)
MCH: 30.8 pg (ref 26.0–34.0)
MCHC: 32.9 g/dL (ref 30.0–36.0)
MCV: 93.5 fL (ref 80.0–100.0)
Monocytes Absolute: 0.4 10*3/uL (ref 0.1–1.0)
Monocytes Relative: 9 %
Neutro Abs: 1.8 10*3/uL (ref 1.7–7.7)
Neutrophils Relative %: 38 %
Platelets: 231 10*3/uL (ref 150–400)
RBC: 2.6 MIL/uL — ABNORMAL LOW (ref 4.22–5.81)
RDW: 13.3 % (ref 11.5–15.5)
WBC: 4.6 10*3/uL (ref 4.0–10.5)
nRBC: 0 % (ref 0.0–0.2)

## 2019-05-27 LAB — COMPREHENSIVE METABOLIC PANEL
ALT: 87 U/L — ABNORMAL HIGH (ref 0–44)
AST: 72 U/L — ABNORMAL HIGH (ref 15–41)
Albumin: 3.4 g/dL — ABNORMAL LOW (ref 3.5–5.0)
Alkaline Phosphatase: 170 U/L — ABNORMAL HIGH (ref 38–126)
Anion gap: 11 (ref 5–15)
BUN: 21 mg/dL — ABNORMAL HIGH (ref 6–20)
CO2: 21 mmol/L — ABNORMAL LOW (ref 22–32)
Calcium: 8.2 mg/dL — ABNORMAL LOW (ref 8.9–10.3)
Chloride: 103 mmol/L (ref 98–111)
Creatinine, Ser: 1.67 mg/dL — ABNORMAL HIGH (ref 0.61–1.24)
GFR calc Af Amer: 57 mL/min — ABNORMAL LOW (ref >60)
GFR calc non Af Amer: 49 mL/min — ABNORMAL LOW (ref >60)
Glucose, Bld: 71 mg/dL (ref 70–99)
Potassium: 3.7 mmol/L (ref 3.5–5.1)
Sodium: 135 mmol/L (ref 135–145)
Total Bilirubin: 0.4 mg/dL (ref 0.3–1.2)
Total Protein: 6.5 g/dL (ref 6.5–8.1)

## 2019-05-27 LAB — GLUCOSE, CAPILLARY: Glucose-Capillary: 202 mg/dL — ABNORMAL HIGH (ref 70–99)

## 2019-05-27 MED ORDER — DEXTROSE IN LACTATED RINGERS 5 % IV SOLN
Freq: Once | INTRAVENOUS | Status: AC
  Administered 2019-05-27: 22:00:00 via INTRAVENOUS

## 2019-05-27 MED ORDER — DEXTROSE 50 % IV SOLN
1.0000 | Freq: Once | INTRAVENOUS | Status: AC
  Administered 2019-05-27: 50 mL via INTRAVENOUS

## 2019-05-27 MED ORDER — DEXTROSE 5 % IN LACTATED RINGERS IV BOLUS
999.0000 mL | Freq: Once | INTRAVENOUS | Status: DC
  Filled 2019-05-27: qty 1000

## 2019-05-27 NOTE — ED Triage Notes (Signed)
EMS pt from home that was found outside, unresponsive by family; initial BS 22; given 12.5 of D10 and sugar came up to 95; pt's blood sugar upon arrival to ED was 55; Dr Jimmye Norman at bedside; pt given amp of D50; pt lethargic but answering questions appropriately;

## 2019-05-27 NOTE — ED Provider Notes (Signed)
[facility] Emergency Department Provider Note       Time seen: ----------------------------------------- 9:51 PM on 05/27/2019 -----------------------------------------   I have reviewed the triage vital signs and the nursing notes.  HISTORY   Chief Complaint Hypoglycemia and Altered Mental Status   HPI Gregory Moody is a 43 y.o. male with a history of COVID-19, diabetes, gastroparesis, tuberculosis, pneumonia who presents to the ED for altered mental status.  Patient arrives by EMS from home.  Was unresponsive and found by the family.  Initial blood sugar 22.  He was given a half amp of D10 and blood sugar came to 95.  On arrival his blood sugar was 55.  He was lethargic but answering questions appropriately.  He denies any pain.  Past Medical History:  Diagnosis Date  . COVID-19   . Diabetes mellitus without complication ([place])   . Gastroparesis   . Tuberculosis     Patient Active Problem List   Diagnosis Date Noted  . Anemia of chronic disease 04/25/2019  . Hypomagnesemia 04/25/2019  . Hypotension 04/23/2019  . CAP (community acquired pneumonia) due to MSSA (methicillin sensitive Staphylococcus aureus) ([place]) 04/14/2019  . COVID-19 virus infection 04/14/2019  . Nausea and vomiting 04/02/2019  . Nausea vomiting and diarrhea   . Pressure injury of skin 03/31/2019  . Acute renal failure (ARF) ([place]) 03/06/2019  . CAP (community acquired pneumonia) 02/14/2019  . AKI (acute kidney injury) ([place]) 02/02/2019  . ARF (acute renal failure) ([place]) 01/03/2019  . Malnutrition of moderate degree 02/24/2018  . GERD (gastroesophageal reflux disease) 02/23/2018  . Diabetic gastroparesis ([place]) 02/23/2018  . Diabetic foot ulcer ([place]) 02/23/2018  . Aspiration pneumonia ([place]) 04/21/2017  . Hypoglycemia 04/21/2017  . Diabetes mellitus with hyperglycemia ([place]) 04/21/2017  . Hip fracture, unspecified laterality, closed, initial encounter ([place]) 04/17/2017  .  Hyperglycemia 04/17/2017  . Protein-calorie malnutrition, severe 04/09/2016  . Cavitary lesion of lung 04/06/2016    Past Surgical History:  Procedure Laterality Date  . ESOPHAGOGASTRODUODENOSCOPY N/A 02/03/2019   Procedure: ESOPHAGOGASTRODUODENOSCOPY (EGD);  Surgeon: Toledo, Benay Pike, MD;  Location: [facility] ENDOSCOPY;  Service: Gastroenterology;  Laterality: N/A;    Allergies Patient has no known allergies.  Social History Social History   Tobacco Use  . Smoking status: Never Smoker  . Smokeless tobacco: Never Used  Substance Use Topics  . Alcohol use: No  . Drug use: No   Review of Systems Constitutional: Negative for fever. Cardiovascular: Negative for chest pain. Respiratory: Negative for shortness of breath. Gastrointestinal: Negative for abdominal pain, vomiting and diarrhea. Musculoskeletal: Negative for back pain. Skin: Negative for rash. Neurological: Negative for headaches, positive for weakness  All systems negative/normal/unremarkable except as stated in the HPI  ____________________________________________   PHYSICAL EXAM:  VITAL SIGNS: ED Triage Vitals  Enc Vitals Group     BP      Pulse      Resp      Temp      Temp src      SpO2      Weight      Height      Head Circumference      Peak Flow      Pain Score      Pain Loc      Pain Edu?      Excl. in [place]?    Constitutional: Alert and oriented. Well appearing and in no distress. Eyes: Conjunctivae are normal. Normal extraocular movements. Cardiovascular: Normal rate, regular rhythm. No murmurs, rubs, or  gallops. Respiratory: Normal respiratory effort without tachypnea nor retractions. Breath sounds are clear and equal bilaterally. No wheezes/rales/rhonchi. Gastrointestinal: Soft and nontender. Normal bowel sounds Musculoskeletal: Nontender with normal range of motion in extremities. No lower extremity tenderness nor edema. Neurologic:  Normal speech and language. No gross focal neurologic  deficits are appreciated.  Skin:  Skin is warm, dry and intact. No rash noted. Psychiatric: Mood and affect are normal. Speech and behavior are normal.  ____________________________________________  EKG: Interpreted by me.  Sinus rhythm with rate of 70 bpm, normal PR interval, normal QRS, nonspecific ST segment changes, normal QT  ____________________________________________  ED COURSE:  As part of my medical decision making, I reviewed the following data within the [place] History obtained from family if available, nursing notes, old chart and ekg, as well as notes from prior ED visits. Patient presented for hypoglycemia, we will assess with labs and imaging as indicated at this time.   Procedures  [name] was evaluated in Emergency Department on 05/27/2019 for the symptoms described in the history of present illness. He was evaluated in the context of the global COVID-19 pandemic, which necessitated consideration that the patient might be at risk for infection with the SARS-CoV-2 virus that causes COVID-19. Institutional protocols and algorithms that pertain to the evaluation of patients at risk for COVID-19 are in a state of rapid change based on information released by regulatory bodies including the CDC and federal and state organizations. These policies and algorithms were followed during the patient's care in the ED.  ____________________________________________   LABS (pertinent positives/negatives)  Labs Reviewed  CBC WITH DIFFERENTIAL/PLATELET - Abnormal; Notable for the following components:      Result Value   RBC 2.60 (*)    Hemoglobin 8.0 (*)    HCT 24.3 (*)    All other components within normal limits  COMPREHENSIVE METABOLIC PANEL - Abnormal; Notable for the following components:   CO2 21 (*)    BUN 21 (*)    Creatinine, Ser 1.67 (*)    Calcium 8.2 (*)    Albumin 3.4 (*)    AST 72 (*)    ALT 87 (*)    Alkaline Phosphatase 170 (*)     GFR calc non Af Amer 49 (*)    GFR calc Af Amer 57 (*)    All other components within normal limits  GLUCOSE, CAPILLARY - Abnormal; Notable for the following components:   Glucose-Capillary 202 (*)    All other components within normal limits  URINALYSIS, COMPLETE (UACMP) WITH MICROSCOPIC  CBG MONITORING, ED   ____________________________________________   DIFFERENTIAL DIAGNOSIS   Hypoglycemia, dehydration, medication side effect, occult infection  FINAL ASSESSMENT AND PLAN  Hypoglycemia   Plan: The patient had presented for hypoglycemia and altered mental status. Patient's labs did indicate hypoglycemia and he was given D50 here.  He was also given D5 LR ate a meal while in the ER.  We will observe for any further drops in his blood sugar.   Laurence Aly, MD    Note: This note was generated in part or whole with voice recognition software. Voice recognition is usually quite accurate but there are transcription errors that can and very often do occur. I apologize for any typographical errors that were not detected and corrected.     Earleen Newport, MD 05/27/19 [phone]

## 2019-05-27 NOTE — ED Notes (Signed)
Dr. Williams in to see pt.

## 2019-05-27 NOTE — ED Notes (Signed)
Dr Jimmye Norman in to follow up

## 2019-05-27 NOTE — ED Notes (Signed)
Pt sitting up in bed eating sandwich and drinking soda; answers all questions appropriately; alert and oriented x 3 at this time

## 2019-05-28 LAB — GLUCOSE, CAPILLARY
Glucose-Capillary: 175 mg/dL — ABNORMAL HIGH (ref 70–99)
Glucose-Capillary: 219 mg/dL — ABNORMAL HIGH (ref 70–99)
Glucose-Capillary: 55 mg/dL — ABNORMAL LOW (ref 70–99)
Glucose-Capillary: 90 mg/dL (ref 70–99)

## 2019-05-28 NOTE — ED Notes (Signed)
Blood sugar 175

## 2019-05-28 NOTE — ED Notes (Addendum)
Pt easily awakened; Pt given sandwich tray and beverage per MD after blood glucose dropped from 202 to 90 in approx 75 minutes; no complaints or requests voiced; call bell in reach for any needs

## 2019-05-28 NOTE — ED Notes (Signed)
Glucose: 90, Laurie RN notified

## 2019-05-28 NOTE — Discharge Instructions (Addendum)
Please follow up with your primary care physician. You will need your liver function test rechecked. Please seek medical attention for any high fevers, chest pain, shortness of breath, change in behavior, persistent vomiting, bloody stool or any other new or concerning symptoms.

## 2019-05-28 NOTE — ED Notes (Signed)
CBG - 219 

## 2019-05-28 NOTE — ED Notes (Signed)
Blood glucose reading of 55 was taken earlier at 2150 and just downloaded; pt's current blood glucose is 90

## 2019-05-28 NOTE — ED Notes (Signed)
Called [place] service

## 2019-05-28 NOTE — ED Provider Notes (Signed)
Patient was observed in the emergency department for hypoglycemia. Patient was observed in the emergency department for a number of hours. No further concerning episodes of hypoglycemia. Slight elevation of liver function tests. Will have patient follow up with PCP.   Nance Pear, MD 05/28/19 [phone]

## 2019-10-10 ENCOUNTER — Emergency Department
Admission: EM | Admit: 2019-10-10 | Discharge: 2019-10-10 | Disposition: A | Discharge status: Home / Self Care | Payer: Self-pay | Attending: Emergency Medicine | Admitting: Emergency Medicine

## 2019-10-10 ENCOUNTER — Emergency Department: Payer: Self-pay

## 2019-10-10 ENCOUNTER — Other Ambulatory Visit: Payer: Self-pay

## 2019-10-10 DIAGNOSIS — R0789 Other chest pain: Secondary | ICD-10-CM | POA: Insufficient documentation

## 2019-10-10 DIAGNOSIS — E1165 Type 2 diabetes mellitus with hyperglycemia: Secondary | ICD-10-CM | POA: Insufficient documentation

## 2019-10-10 DIAGNOSIS — Z794 Long term (current) use of insulin: Secondary | ICD-10-CM | POA: Insufficient documentation

## 2019-10-10 DIAGNOSIS — Z7982 Long term (current) use of aspirin: Secondary | ICD-10-CM | POA: Insufficient documentation

## 2019-10-10 LAB — TROPONIN I (HIGH SENSITIVITY): Troponin I (High Sensitivity): 7 ng/L (ref <18)

## 2019-10-10 LAB — CBC
HCT: 25.5 % — ABNORMAL LOW (ref 39.0–52.0)
Hemoglobin: 8.8 g/dL — ABNORMAL LOW (ref 13.0–17.0)
MCH: 30.7 pg (ref 26.0–34.0)
MCHC: 34.5 g/dL (ref 30.0–36.0)
MCV: 88.9 fL (ref 80.0–100.0)
Platelets: 285 10*3/uL (ref 150–400)
RBC: 2.87 MIL/uL — ABNORMAL LOW (ref 4.22–5.81)
RDW: 12.4 % (ref 11.5–15.5)
WBC: 4 10*3/uL (ref 4.0–10.5)
nRBC: 0 % (ref 0.0–0.2)

## 2019-10-10 LAB — BASIC METABOLIC PANEL
Anion gap: 9 (ref 5–15)
BUN: 18 mg/dL (ref 6–20)
CO2: 20 mmol/L — ABNORMAL LOW (ref 22–32)
Calcium: 8.5 mg/dL — ABNORMAL LOW (ref 8.9–10.3)
Chloride: 100 mmol/L (ref 98–111)
Creatinine, Ser: 1.58 mg/dL — ABNORMAL HIGH (ref 0.61–1.24)
GFR calc Af Amer: >60 mL/min (ref >60)
GFR calc non Af Amer: 52 mL/min — ABNORMAL LOW (ref >60)
Glucose, Bld: 353 mg/dL — ABNORMAL HIGH (ref 70–99)
Potassium: 3.4 mmol/L — ABNORMAL LOW (ref 3.5–5.1)
Sodium: 129 mmol/L — ABNORMAL LOW (ref 135–145)

## 2019-10-10 IMAGING — CR DG CHEST 2V
2 series · 2 of 2 positions shown · non-contrast
Comparison: [DATE]

CLINICAL DATA: Chest pain

EXAM:
CHEST - 2 VIEW

[chest lat]
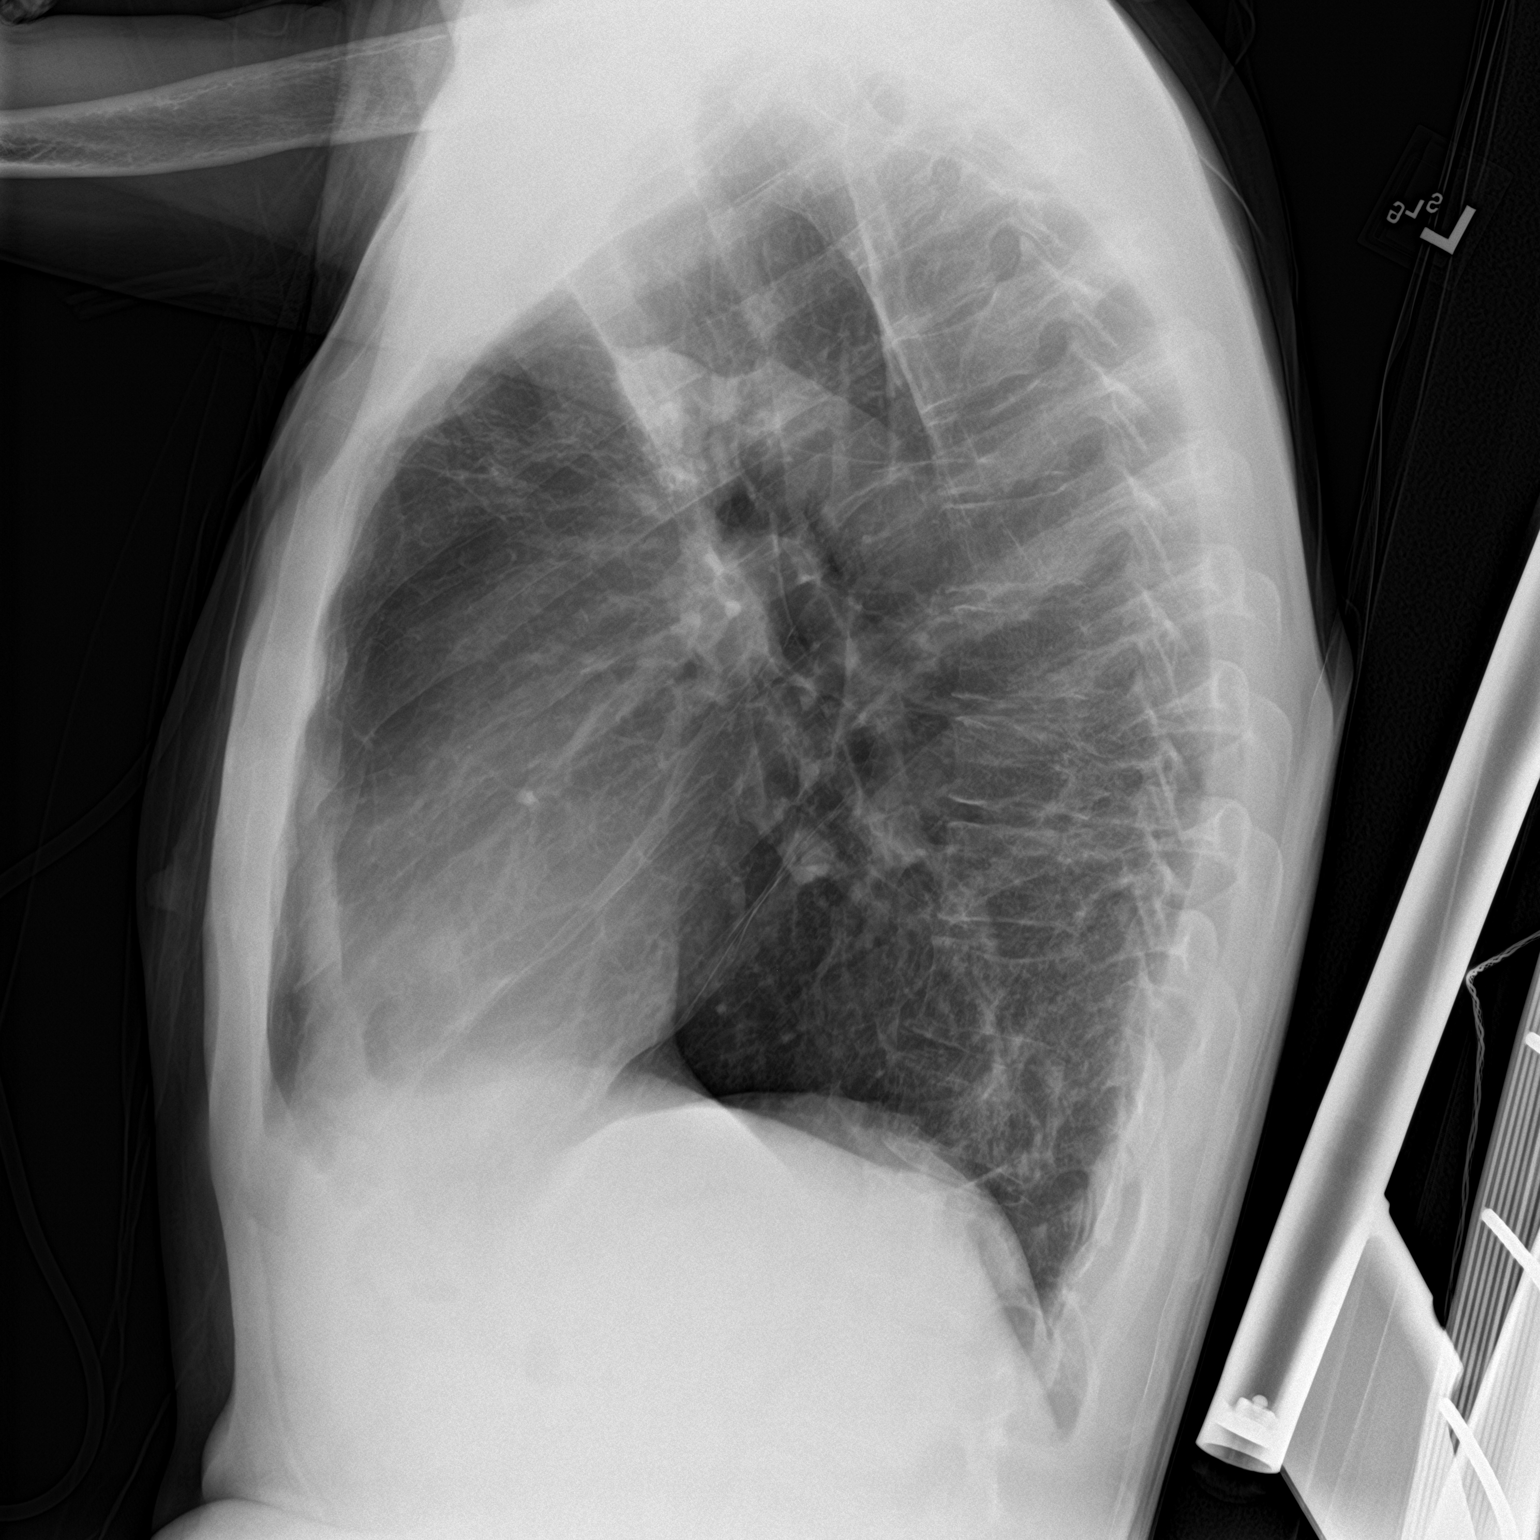

[chest ap]
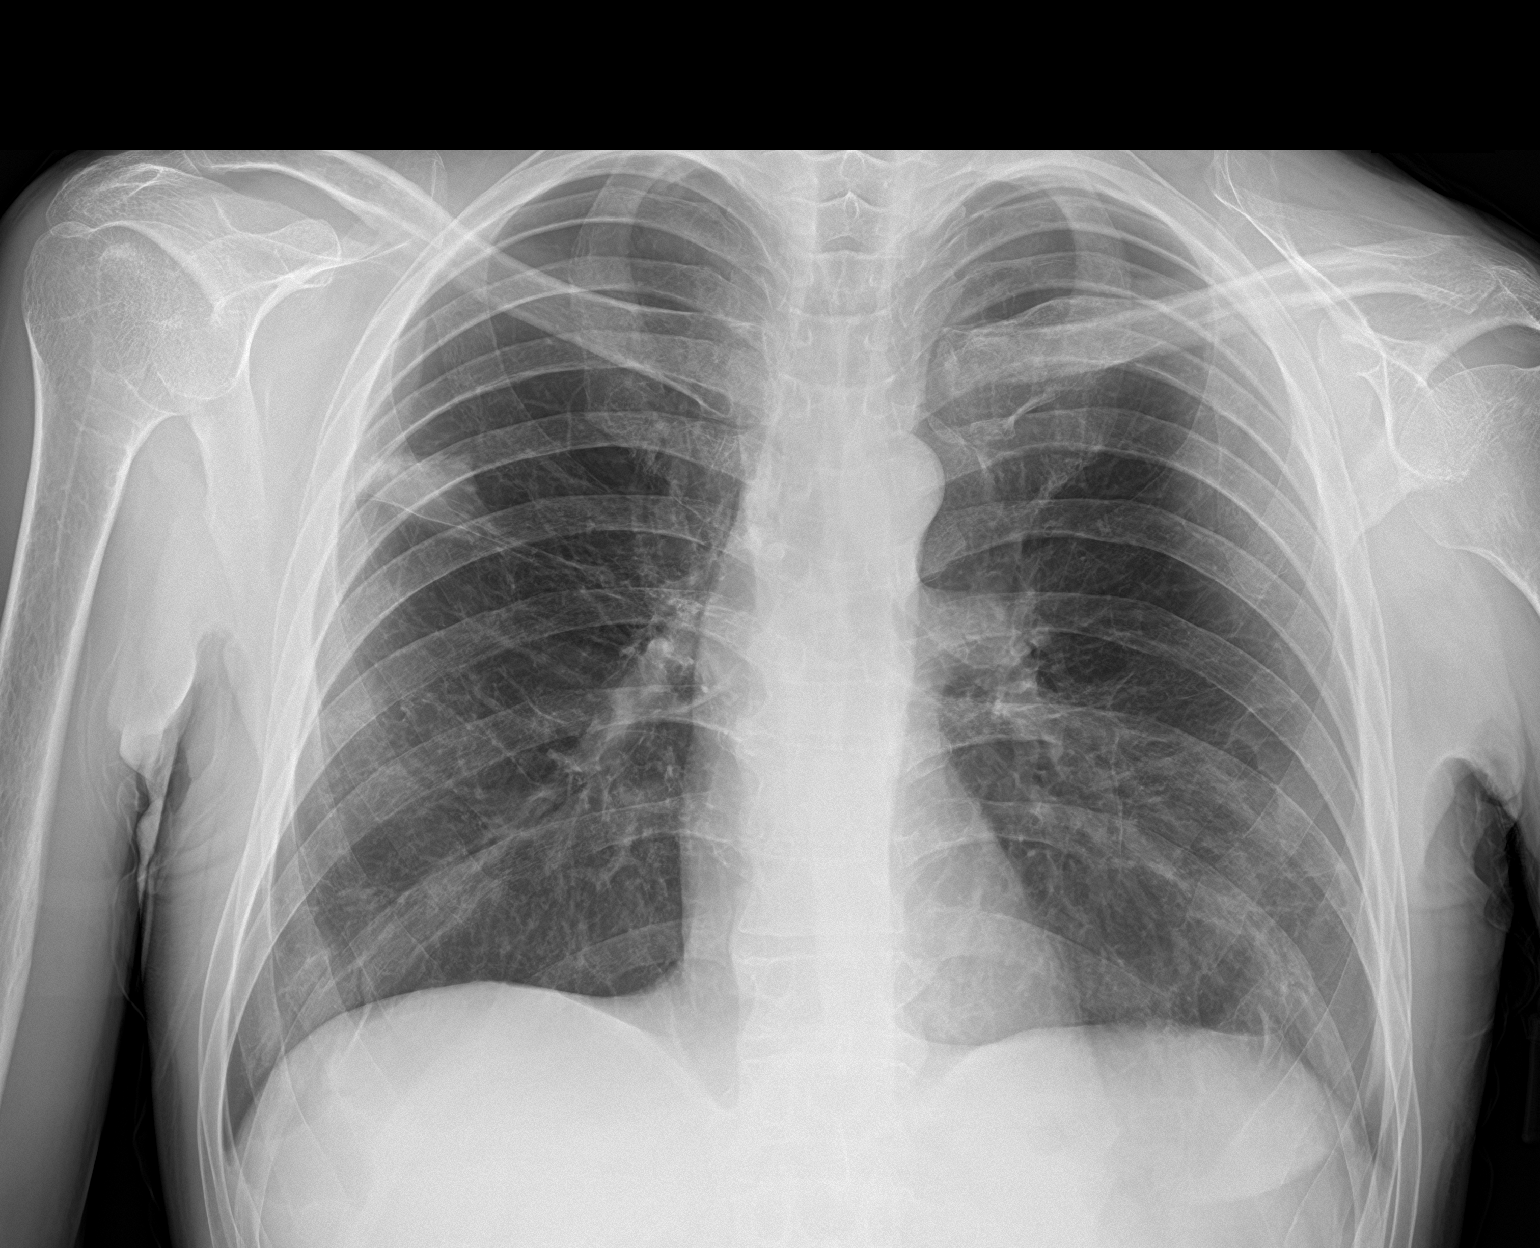

[2 of 2 positions shown; findings below may reference images not displayed]

FINDINGS: COPD and emphysema. Right upper lobe density unchanged. Patchy left
lower lobe airspace disease has improved. No new area of infiltrate
effusion or heart failure.
IMPRESSION: COPD.

Stable right upper lobe density. Improvement in left lower lobe
airspace disease likely resolving pneumonia.

## 2019-10-10 MED ORDER — NAPROXEN 500 MG PO TABS: 500.0000 mg | ORAL_TABLET | Freq: Two times a day (BID) | ORAL | 2 refills | Status: DC

## 2019-10-10 MED ORDER — SODIUM CHLORIDE 0.9% FLUSH: 3.0000 mL | Freq: Once | INTRAVENOUS | Status: DC

## 2019-10-10 MED ORDER — KETOROLAC TROMETHAMINE 30 MG/ML IJ SOLN
30.0000 mg | Freq: Once | INTRAMUSCULAR | Status: AC
  Administered 2019-10-10: 30 mg via INTRAMUSCULAR
  Filled 2019-10-10: qty 1

## 2019-10-10 NOTE — ED Notes (Signed)
COVID positive in July.

## 2019-10-10 NOTE — ED Provider Notes (Signed)
[facility] Emergency Department Provider Note   ____________________________________________    I have reviewed the triage vital signs and the nursing notes.   HISTORY  Chief Complaint Chest Pain and Hyperglycemia     HPI Gregory Moody is a 43 y.o. male who presents with complaints of left lower chest tenderness that began this morning.  He does not remember having an injury to the area.  Denies fevers or chills or cough.  No shortness of breath.  No pleurisy.  No nausea or vomiting.  No diaphoresis.  Is not taking thing for this.  Describes pain when he pushes on the area.   Past Medical History:  Diagnosis Date  . COVID-19   . Diabetes mellitus without complication ([place])   . Gastroparesis   . Tuberculosis     Patient Active Problem List   Diagnosis Date Noted  . Anemia of chronic disease 04/25/2019  . Hypomagnesemia 04/25/2019  . Hypotension 04/23/2019  . CAP (community acquired pneumonia) due to MSSA (methicillin sensitive Staphylococcus aureus) ([place]) 04/14/2019  . COVID-19 virus infection 04/14/2019  . Nausea and vomiting 04/02/2019  . Nausea vomiting and diarrhea   . Pressure injury of skin 03/31/2019  . Acute renal failure (ARF) ([place]) 03/06/2019  . CAP (community acquired pneumonia) 02/14/2019  . AKI (acute kidney injury) ([place]) 02/02/2019  . ARF (acute renal failure) ([place]) 01/03/2019  . Malnutrition of moderate degree 02/24/2018  . GERD (gastroesophageal reflux disease) 02/23/2018  . Diabetic gastroparesis ([place]) 02/23/2018  . Diabetic foot ulcer ([place]) 02/23/2018  . Aspiration pneumonia ([place]) 04/21/2017  . Hypoglycemia 04/21/2017  . Diabetes mellitus with hyperglycemia ([place]) 04/21/2017  . Hip fracture, unspecified laterality, closed, initial encounter ([place]) 04/17/2017  . Hyperglycemia 04/17/2017  . Protein-calorie malnutrition, severe 04/09/2016  . Cavitary lesion of lung 04/06/2016    Past Surgical History:   Procedure Laterality Date  . ESOPHAGOGASTRODUODENOSCOPY N/A 02/03/2019   Procedure: ESOPHAGOGASTRODUODENOSCOPY (EGD);  Surgeon: Toledo, Benay Pike, MD;  Location: [facility] ENDOSCOPY;  Service: Gastroenterology;  Laterality: N/A;    Prior to Admission medications   Medication Sig Start Date End Date Taking? Authorizing Provider  aspirin EC 81 MG tablet Take 81 mg by mouth daily.    [provider]  insulin NPH-regular Human (70-30) 100 UNIT/ML injection Inject 20 Units into the skin 2 (two) times daily with a meal. 25 units with breakfast in am and 35 units with supper 04/03/19   [name], MD  loperamide (IMODIUM A-D) 2 MG tablet Take 1 tablet (2 mg total) by mouth 4 (four) times daily as needed for diarrhea or loose stools. 04/19/19   [name], MD  naproxen (NAPROSYN) 500 MG tablet Take 1 tablet (500 mg total) by mouth 2 (two) times daily with a meal. 10/10/19   [name], MD  ondansetron (ZOFRAN) 4 MG tablet Take 1 tablet (4 mg total) by mouth every 6 (six) hours as needed for nausea. 04/03/19   [name], MD  pantoprazole (PROTONIX) 40 MG tablet Take 1 tablet (40 mg total) by mouth 2 (two) times daily before a meal. 04/03/19   [name], MD     Allergies Patient has no known allergies.  Family History  Problem Relation Age of Onset  . Diabetes Mother   . Diabetes Father     Social History Social History   Tobacco Use  . Smoking status: Never Smoker  . Smokeless tobacco: Never Used  Substance Use Topics  . Alcohol use: No  .  Drug use: No    Review of Systems  Constitutional: No fever/chills Eyes: No visual changes.  ENT: No sore throat. Cardiovascular: As above Respiratory: Denies shortness of breath. Gastrointestinal: No abdominal pain.  No nausea, no vomiting.   Genitourinary: Negative for dysuria. Musculoskeletal: Negative for back pain. Skin: Negative for rash. Neurological: Negative for headaches or weakness    ____________________________________________   PHYSICAL EXAM:  VITAL SIGNS: ED Triage Vitals  Enc Vitals Group     BP 10/10/19 1028 101/72     Pulse Rate 10/10/19 1028 69     Resp 10/10/19 1028 18     Temp 10/10/19 1444 98.1 F (36.7 C)     Temp Source 10/10/19 1028 Oral     SpO2 10/10/19 1028 100 %     Weight 10/10/19 1012 59 kg (130 lb)     Height 10/10/19 1012 1.727 m (5\' 8" )     Head Circumference --      Peak Flow --      Pain Score 10/10/19 1012 10     Pain Loc --      Pain Edu? --      Excl. in [place]? --     Constitutional: Alert and oriented.   Nose: No congestion/rhinnorhea. Mouth/Throat: Mucous membranes are moist.    Cardiovascular: Normal rate, regular rhythm. Grossly normal heart sounds.  Good peripheral circulation.  Point tenderness to the left lower anterior chest wall, no bruising or bony normalities Respiratory: Normal respiratory effort.  No retractions. Lungs CTAB. Gastrointestinal: Soft and nontender. No distention.    Musculoskeletal: No lower extremity tenderness nor edema.  Warm and well perfused Neurologic:  Normal speech and language. No gross focal neurologic deficits are appreciated.  Skin:  Skin is warm, dry and intact. No rash noted. Psychiatric: Mood and affect are normal. Speech and behavior are normal.  ____________________________________________   LABS (all labs ordered are listed, but only abnormal results are displayed)  Labs Reviewed  BASIC METABOLIC PANEL - Abnormal; Notable for the following components:      Result Value   Sodium 129 (*)    Potassium 3.4 (*)    CO2 20 (*)    Glucose, Bld 353 (*)    Creatinine, Ser 1.58 (*)    Calcium 8.5 (*)    GFR calc non Af Amer 52 (*)    All other components within normal limits  CBC - Abnormal; Notable for the following components:   RBC 2.87 (*)    Hemoglobin 8.8 (*)    HCT 25.5 (*)    All other components within normal limits  TROPONIN I (HIGH SENSITIVITY)    ____________________________________________  EKG  ED ECG REPORT I, [name], the attending physician, personally viewed and interpreted this ECG.  Date: 10/10/2019  Rhythm: normal sinus rhythm QRS Axis: normal Intervals: normal ST/T Wave abnormalities: normal Narrative Interpretation: no evidence of acute ischemia  ____________________________________________  RADIOLOGY  Chest x-ray unremarkable, stable right upper lobe density pneumonia improving ____________________________________________   PROCEDURES  Procedure(s) performed: No  Procedures   Critical Care performed: No ____________________________________________   INITIAL IMPRESSION / ASSESSMENT AND PLAN / ED COURSE  Pertinent labs & imaging results that were available during my care of the patient were reviewed by me and considered in my medical decision making (see chart for details).  Well-appearing and in no acute distress.  Point tenderness to the left lower anterior chest wall no known injury to the area, suspect musculoskeletal pain however.  Given reassuring  EKG, improving chest x-ray, unremarkable troponin.  Patient has chronically low hemoglobin and a history of diabetes.  Will treat with IM Toradol, outpatient naproxen, follow-up with PCP.  Return precautions discussed    ____________________________________________   FINAL CLINICAL IMPRESSION(S) / ED DIAGNOSES  Final diagnoses:  Chest wall pain        Note:  This document was prepared using Dragon voice recognition software and may include unintentional dictation errors.   [name], MD 10/10/19 [phone]

## 2019-10-10 NOTE — ED Triage Notes (Addendum)
Pt comes into the ED via EMS from home with c/o chest pain that started this morning, given nitroSLx1, ASA 324mg .. [id number].. pt did not take his insulin this morning. Pt is in NAD on arrival. Pt states he woke up with the pain this morning. Area in the left lateral rib, tender to touch, hurts with deep breathing  Pt is in NAD.

## 2019-10-10 NOTE — ED Notes (Signed)

## 2019-11-18 ENCOUNTER — Emergency Department: Payer: Self-pay

## 2019-11-18 ENCOUNTER — Encounter: Payer: Self-pay | Admitting: Emergency Medicine

## 2019-11-18 ENCOUNTER — Other Ambulatory Visit: Payer: Self-pay

## 2019-11-18 ENCOUNTER — Emergency Department
Admission: EM | Admit: 2019-11-18 | Discharge: 2019-11-19 | Disposition: A | Discharge status: Home / Self Care | Payer: Self-pay | Attending: Emergency Medicine | Admitting: Emergency Medicine

## 2019-11-18 DIAGNOSIS — R0789 Other chest pain: Secondary | ICD-10-CM | POA: Insufficient documentation

## 2019-11-18 DIAGNOSIS — Z7984 Long term (current) use of oral hypoglycemic drugs: Secondary | ICD-10-CM | POA: Insufficient documentation

## 2019-11-18 DIAGNOSIS — Z8616 Personal history of COVID-19: Secondary | ICD-10-CM | POA: Insufficient documentation

## 2019-11-18 DIAGNOSIS — E1165 Type 2 diabetes mellitus with hyperglycemia: Secondary | ICD-10-CM | POA: Insufficient documentation

## 2019-11-18 DIAGNOSIS — F17200 Nicotine dependence, unspecified, uncomplicated: Secondary | ICD-10-CM | POA: Insufficient documentation

## 2019-11-18 DIAGNOSIS — R079 Chest pain, unspecified: Secondary | ICD-10-CM

## 2019-11-18 DIAGNOSIS — K219 Gastro-esophageal reflux disease without esophagitis: Secondary | ICD-10-CM | POA: Insufficient documentation

## 2019-11-18 DIAGNOSIS — R739 Hyperglycemia, unspecified: Secondary | ICD-10-CM

## 2019-11-18 LAB — CBC
HCT: 25 % — ABNORMAL LOW (ref 39.0–52.0)
Hemoglobin: 8.7 g/dL — ABNORMAL LOW (ref 13.0–17.0)
MCH: 30.4 pg (ref 26.0–34.0)
MCHC: 34.8 g/dL (ref 30.0–36.0)
MCV: 87.4 fL (ref 80.0–100.0)
Platelets: 306 10*3/uL (ref 150–400)
RBC: 2.86 MIL/uL — ABNORMAL LOW (ref 4.22–5.81)
RDW: 12.4 % (ref 11.5–15.5)
WBC: 9.5 10*3/uL (ref 4.0–10.5)
nRBC: 0 % (ref 0.0–0.2)

## 2019-11-18 LAB — BASIC METABOLIC PANEL
Anion gap: 10 (ref 5–15)
BUN: 14 mg/dL (ref 6–20)
CO2: 22 mmol/L (ref 22–32)
Calcium: 8.5 mg/dL — ABNORMAL LOW (ref 8.9–10.3)
Chloride: 93 mmol/L — ABNORMAL LOW (ref 98–111)
Creatinine, Ser: 1.56 mg/dL — ABNORMAL HIGH (ref 0.61–1.24)
GFR calc Af Amer: >60 mL/min (ref >60)
GFR calc non Af Amer: 53 mL/min — ABNORMAL LOW (ref >60)
Glucose, Bld: 475 mg/dL — ABNORMAL HIGH (ref 70–99)
Potassium: 3.5 mmol/L (ref 3.5–5.1)
Sodium: 125 mmol/L — ABNORMAL LOW (ref 135–145)

## 2019-11-18 LAB — TROPONIN I (HIGH SENSITIVITY)
Troponin I (High Sensitivity): 6 ng/L (ref <18)
Troponin I (High Sensitivity): 5 ng/L (ref <18)

## 2019-11-18 LAB — MAGNESIUM: Magnesium: 1.8 mg/dL (ref 1.7–2.4)

## 2019-11-18 LAB — GLUCOSE, CAPILLARY: Glucose-Capillary: 410 mg/dL — ABNORMAL HIGH (ref 70–99)

## 2019-11-18 IMAGING — DX DG CHEST 1V PORT
1 series · 1 of 1 positions shown · non-contrast
Comparison: [DATE]

CLINICAL DATA: Burning chest pain

EXAM:
PORTABLE CHEST 1 VIEW

[chest ap]
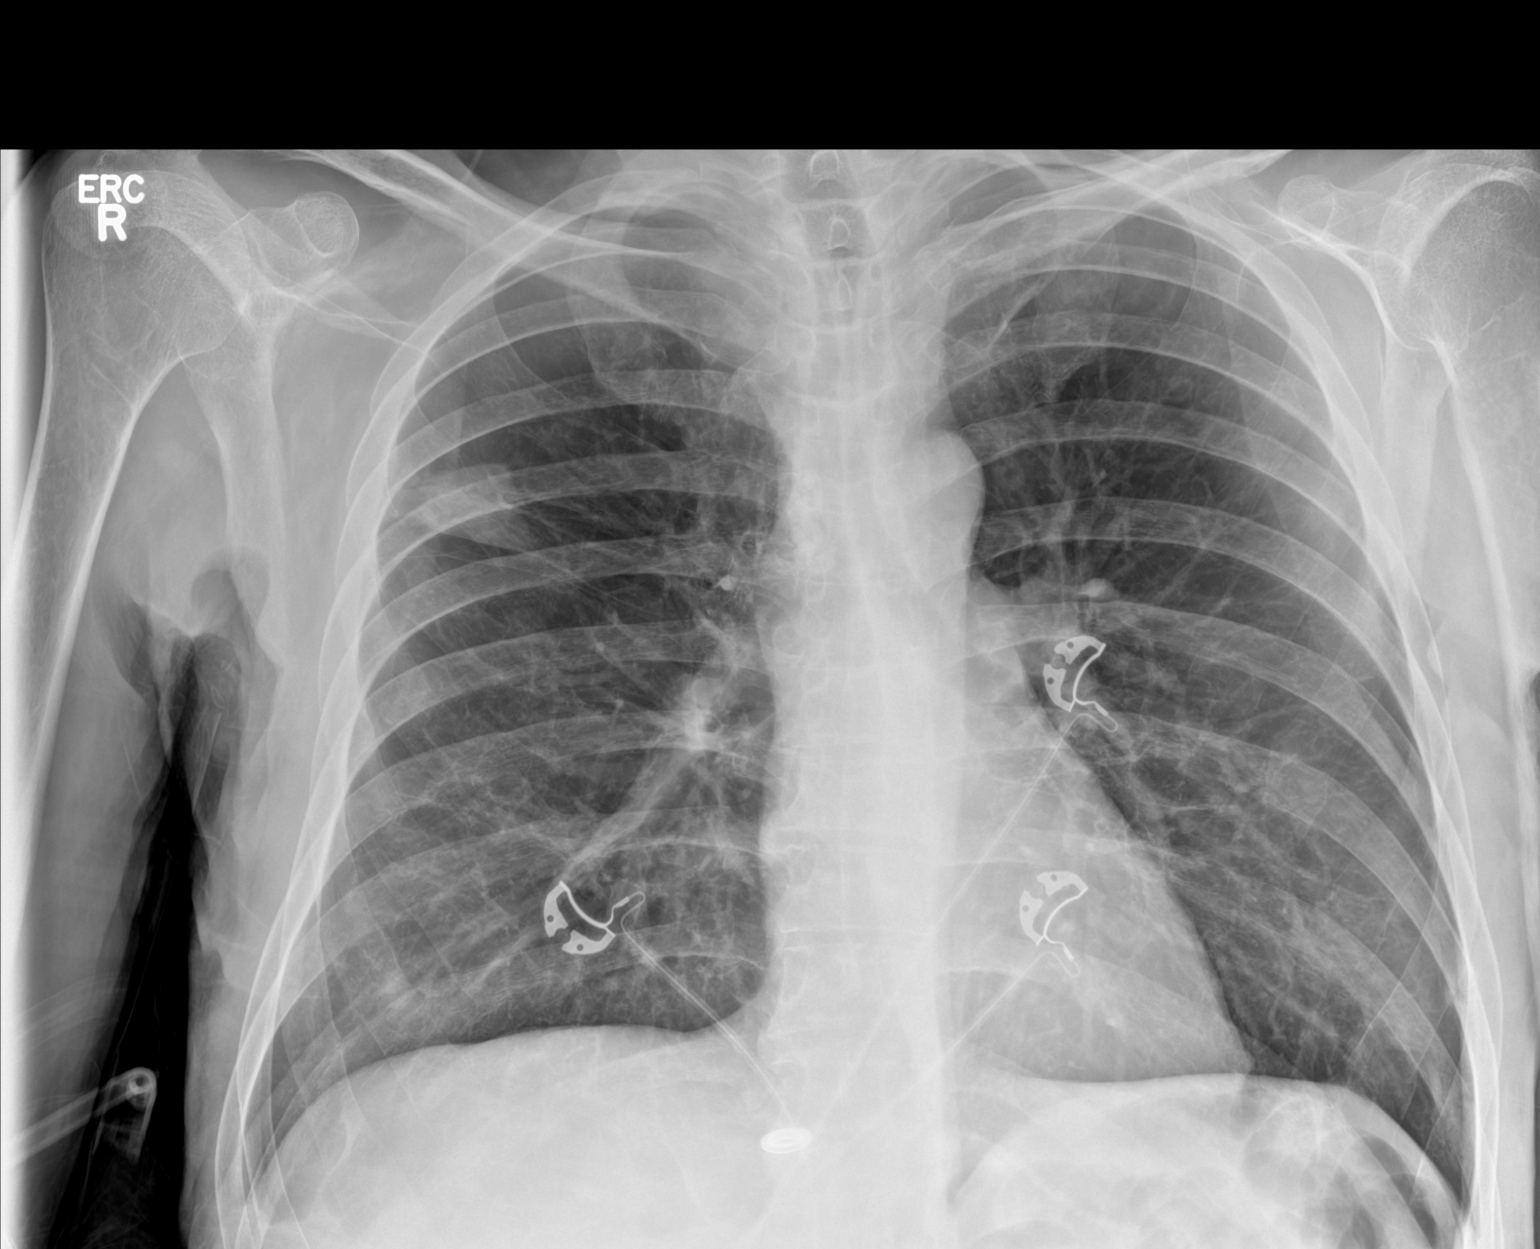

[1 of 1 positions shown; findings below may reference images not displayed]

FINDINGS: The heart size and mediastinal contours are within normal limits.
Again noted is a nodular opacity seen within the periphery of the
right upper lung measuring 3 cm. There is slight hyperinflation of
the lung zones. No new airspace consolidation or pleural effusion.
No acute osseous abnormality.
IMPRESSION: No active disease. Stable 3 cm opacity in the periphery of the right
upper lung which dates back to [EX].

## 2019-11-18 MED ORDER — METOCLOPRAMIDE HCL 5 MG/ML IJ SOLN
10.0000 mg | Freq: Once | INTRAMUSCULAR | Status: AC
  Administered 2019-11-18: 10 mg via INTRAVENOUS
  Filled 2019-11-18: qty 2

## 2019-11-18 MED ORDER — FAMOTIDINE IN NACL 20-0.9 MG/50ML-% IV SOLN
20.0000 mg | Freq: Once | INTRAVENOUS | Status: AC
  Administered 2019-11-18: 20 mg via INTRAVENOUS
  Filled 2019-11-18: qty 50

## 2019-11-18 MED ORDER — SODIUM CHLORIDE 0.9 % IV SOLN: Freq: Once | INTRAVENOUS | Status: AC

## 2019-11-18 MED ORDER — INSULIN ASPART 100 UNIT/ML ~~LOC~~ SOLN
10.0000 [IU] | Freq: Once | SUBCUTANEOUS | Status: AC
  Administered 2019-11-18: 10 [IU] via SUBCUTANEOUS
  Filled 2019-11-18: qty 1

## 2019-11-18 NOTE — ED Provider Notes (Signed)
[facility] Emergency Department Provider Note       Time seen: ----------------------------------------- 9:04 PM on 11/18/2019 -----------------------------------------   I have reviewed the triage vital signs and the nursing notes.  HISTORY   Chief Complaint Chest Pain    HPI Gregory Moody is a 44 y.o. male with a history of COVID-19, diabetes, gastroparesis, tuberculosis, hypomagnesemia who presents to the ED for complaints of burning in his chest for 2 to 3 days.  Patient denies shortness of breath or nausea or vomiting.  Patient reports his blood sugar has been elevated.  He denies fevers, chills or other complaints.  Past Medical History:  Diagnosis Date  . COVID-19   . Diabetes mellitus without complication ([place])   . Gastroparesis   . Tuberculosis     Patient Active Problem List   Diagnosis Date Noted  . Anemia of chronic disease 04/25/2019  . Hypomagnesemia 04/25/2019  . Hypotension 04/23/2019  . CAP (community acquired pneumonia) due to MSSA (methicillin sensitive Staphylococcus aureus) ([place]) 04/14/2019  . COVID-19 virus infection 04/14/2019  . Nausea and vomiting 04/02/2019  . Nausea vomiting and diarrhea   . Pressure injury of skin 03/31/2019  . Acute renal failure (ARF) ([place]) 03/06/2019  . CAP (community acquired pneumonia) 02/14/2019  . AKI (acute kidney injury) ([place]) 02/02/2019  . ARF (acute renal failure) ([place]) 01/03/2019  . Malnutrition of moderate degree 02/24/2018  . GERD (gastroesophageal reflux disease) 02/23/2018  . Diabetic gastroparesis ([place]) 02/23/2018  . Diabetic foot ulcer ([place]) 02/23/2018  . Aspiration pneumonia ([place]) 04/21/2017  . Hypoglycemia 04/21/2017  . Diabetes mellitus with hyperglycemia ([place]) 04/21/2017  . Hip fracture, unspecified laterality, closed, initial encounter ([place]) 04/17/2017  . Hyperglycemia 04/17/2017  . Protein-calorie malnutrition, severe 04/09/2016  . Cavitary lesion of lung  04/06/2016    Past Surgical History:  Procedure Laterality Date  . ESOPHAGOGASTRODUODENOSCOPY N/A 02/03/2019   Procedure: ESOPHAGOGASTRODUODENOSCOPY (EGD);  Surgeon: Toledo, Benay Pike, MD;  Location: [facility] ENDOSCOPY;  Service: Gastroenterology;  Laterality: N/A;    Allergies Patient has no known allergies.  Social History Social History   Tobacco Use  . Smoking status: Current Some Day Smoker  . Smokeless tobacco: Never Used  Substance Use Topics  . Alcohol use: No  . Drug use: No    Review of Systems Constitutional: Negative for fever. Cardiovascular: Positive for chest pain Respiratory: Negative for shortness of breath. Gastrointestinal: Positive for epigastric pain Musculoskeletal: Negative for back pain. Skin: Negative for rash. Neurological: Negative for headaches, focal weakness or numbness.  All systems negative/normal/unremarkable except as stated in the HPI  ____________________________________________   PHYSICAL EXAM:  VITAL SIGNS: ED Triage Vitals  Enc Vitals Group     BP 11/18/19 1943 108/75     Pulse Rate 11/18/19 1943 78     Resp 11/18/19 1943 18     Temp 11/18/19 1943 98.4 F (36.9 C)     Temp Source 11/18/19 1943 Oral     SpO2 11/18/19 1943 100 %     Weight 11/18/19 1944 135 lb (61.2 kg)     Height 11/18/19 1944 5\' 8"  (1.727 m)     Head Circumference --      Peak Flow --      Pain Score 11/18/19 1944 8     Pain Loc --      Pain Edu? --      Excl. in [place]? --     Constitutional: Alert and oriented.  No acute distress Eyes: Conjunctivae are normal. Normal  extraocular movements. ENT      Head: Normocephalic and atraumatic.      Nose: No congestion/rhinnorhea.      Mouth/Throat: Mucous membranes are moist.      Neck: No stridor. Cardiovascular: Normal rate, regular rhythm. No murmurs, rubs, or gallops. Respiratory: Normal respiratory effort without tachypnea nor retractions. Breath sounds are clear and equal bilaterally. No  wheezes/rales/rhonchi. Gastrointestinal: Soft and nontender. Normal bowel sounds Musculoskeletal: Nontender with normal range of motion in extremities. No lower extremity tenderness nor edema. Neurologic:  Normal speech and language. No gross focal neurologic deficits are appreciated.  Skin:  Skin is warm, dry and intact. No rash noted. Psychiatric: Mood and affect are normal. Speech and behavior are normal.  ____________________________________________  EKG: Interpreted by me.  Sinus rhythm with rate of 90 bpm, normal PR interval, normal QRS, normal QT  ____________________________________________  ED COURSE:  As part of my medical decision making, I reviewed the following data within the [place] History obtained from family if available, nursing notes, old chart and ekg, as well as notes from prior ED visits. Patient presented for burning in his chest and hyperglycemia, we will assess with labs and imaging as indicated at this time.   Procedures  Jaguar Safko was evaluated in Emergency Department on 11/18/2019 for the symptoms described in the history of present illness. He was evaluated in the context of the global COVID-19 pandemic, which necessitated consideration that the patient might be at risk for infection with the SARS-CoV-2 virus that causes COVID-19. Institutional protocols and algorithms that pertain to the evaluation of patients at risk for COVID-19 are in a state of rapid change based on information released by regulatory bodies including the CDC and federal and state organizations. These policies and algorithms were followed during the patient's care in the ED.  ____________________________________________   LABS (pertinent positives/negatives)  Labs Reviewed  BASIC METABOLIC PANEL - Abnormal; Notable for the following components:      Result Value   Sodium 125 (*)    Chloride 93 (*)    Glucose, Bld 475 (*)    Creatinine, Ser 1.56 (*)     Calcium 8.5 (*)    GFR calc non Af Amer 53 (*)    All other components within normal limits  CBC - Abnormal; Notable for the following components:   RBC 2.86 (*)    Hemoglobin 8.7 (*)    HCT 25.0 (*)    All other components within normal limits  GLUCOSE, CAPILLARY - Abnormal; Notable for the following components:   Glucose-Capillary 410 (*)    All other components within normal limits  MAGNESIUM  BASIC METABOLIC PANEL  CBG MONITORING, ED  TROPONIN I (HIGH SENSITIVITY)  TROPONIN I (HIGH SENSITIVITY)    RADIOLOGY Images were viewed by me  Chest x-ray IMPRESSION:  No active disease. Stable 3 cm opacity in the periphery of the right  upper lung which dates back to 2017.  ____________________________________________   DIFFERENTIAL DIAGNOSIS   GERD, gastritis, peptic ulcer disease, gastroparesis, MI, unstable angina  FINAL ASSESSMENT AND PLAN  Chest pain, GERD, hyperglycemia, possible gastroparesis   Plan: The patient had presented for burning chest pain. Patient's labs did reveal chronic anemia as well as hyponatremia which is acute on chronic.  He also has AKI and hyperglycemia for which she was given IV fluids. Patient's imaging did not reveal any acute process.  Patient was given fluids as well as subcutaneous insulin.  He reports improvement in his  symptoms.   Laurence Aly, MD    Note: This note was generated in part or whole with voice recognition software. Voice recognition is usually quite accurate but there are transcription errors that can and very often do occur. I apologize for any typographical errors that were not detected and corrected.     Earleen Newport, MD 11/18/19 [phone]

## 2019-11-18 NOTE — ED Triage Notes (Signed)
Patient brought in by ems from home with complaint of burning in his chest for 2-3 days. Patient denies shortness of breath or nausea/vomiting.

## 2019-11-18 NOTE — ED Notes (Signed)
Pt states he has not eaten in 3 days.

## 2019-11-19 LAB — BASIC METABOLIC PANEL
Anion gap: 9 (ref 5–15)
BUN: 13 mg/dL (ref 6–20)
CO2: 18 mmol/L — ABNORMAL LOW (ref 22–32)
Calcium: 7.9 mg/dL — ABNORMAL LOW (ref 8.9–10.3)
Chloride: 97 mmol/L — ABNORMAL LOW (ref 98–111)
Creatinine, Ser: 1.31 mg/dL — ABNORMAL HIGH (ref 0.61–1.24)
GFR calc Af Amer: >60 mL/min (ref >60)
GFR calc non Af Amer: >60 mL/min (ref >60)
Glucose, Bld: 437 mg/dL — ABNORMAL HIGH (ref 70–99)
Potassium: 3.9 mmol/L (ref 3.5–5.1)
Sodium: 124 mmol/L — ABNORMAL LOW (ref 135–145)

## 2019-11-19 LAB — GLUCOSE, CAPILLARY: Glucose-Capillary: 370 mg/dL — ABNORMAL HIGH (ref 70–99)

## 2019-12-27 ENCOUNTER — Emergency Department: Payer: Self-pay

## 2019-12-27 ENCOUNTER — Other Ambulatory Visit: Payer: Self-pay

## 2019-12-27 ENCOUNTER — Inpatient Hospital Stay
Admission: EM | Admit: 2019-12-27 | Discharge: 2020-01-03 | DRG: 637 | Disposition: A | Discharge status: Home Health | Payer: Self-pay | Attending: Internal Medicine | Admitting: Internal Medicine

## 2019-12-27 DIAGNOSIS — R1310 Dysphagia, unspecified: Secondary | ICD-10-CM

## 2019-12-27 DIAGNOSIS — Z794 Long term (current) use of insulin: Secondary | ICD-10-CM

## 2019-12-27 DIAGNOSIS — F419 Anxiety disorder, unspecified: Secondary | ICD-10-CM | POA: Diagnosis present

## 2019-12-27 DIAGNOSIS — F17200 Nicotine dependence, unspecified, uncomplicated: Secondary | ICD-10-CM | POA: Diagnosis present

## 2019-12-27 DIAGNOSIS — R627 Adult failure to thrive: Secondary | ICD-10-CM | POA: Diagnosis present

## 2019-12-27 DIAGNOSIS — K219 Gastro-esophageal reflux disease without esophagitis: Secondary | ICD-10-CM | POA: Diagnosis present

## 2019-12-27 DIAGNOSIS — Z681 Body mass index (BMI) 19 or less, adult: Secondary | ICD-10-CM

## 2019-12-27 DIAGNOSIS — L89013 Pressure ulcer of right elbow, stage 3: Secondary | ICD-10-CM | POA: Diagnosis present

## 2019-12-27 DIAGNOSIS — Z7982 Long term (current) use of aspirin: Secondary | ICD-10-CM

## 2019-12-27 DIAGNOSIS — Z8611 Personal history of tuberculosis: Secondary | ICD-10-CM

## 2019-12-27 DIAGNOSIS — R159 Full incontinence of feces: Secondary | ICD-10-CM | POA: Diagnosis present

## 2019-12-27 DIAGNOSIS — N179 Acute kidney failure, unspecified: Secondary | ICD-10-CM | POA: Diagnosis present

## 2019-12-27 DIAGNOSIS — E1122 Type 2 diabetes mellitus with diabetic chronic kidney disease: Secondary | ICD-10-CM | POA: Diagnosis present

## 2019-12-27 DIAGNOSIS — K8689 Other specified diseases of pancreas: Secondary | ICD-10-CM | POA: Diagnosis present

## 2019-12-27 DIAGNOSIS — Z91128 Patient's intentional underdosing of medication regimen for other reason: Secondary | ICD-10-CM

## 2019-12-27 DIAGNOSIS — E871 Hypo-osmolality and hyponatremia: Secondary | ICD-10-CM | POA: Diagnosis present

## 2019-12-27 DIAGNOSIS — R32 Unspecified urinary incontinence: Secondary | ICD-10-CM | POA: Diagnosis present

## 2019-12-27 DIAGNOSIS — J189 Pneumonia, unspecified organism: Secondary | ICD-10-CM | POA: Diagnosis present

## 2019-12-27 DIAGNOSIS — I959 Hypotension, unspecified: Secondary | ICD-10-CM | POA: Diagnosis present

## 2019-12-27 DIAGNOSIS — K3184 Gastroparesis: Secondary | ICD-10-CM | POA: Diagnosis present

## 2019-12-27 DIAGNOSIS — A0472 Enterocolitis due to Clostridium difficile, not specified as recurrent: Secondary | ICD-10-CM | POA: Diagnosis present

## 2019-12-27 DIAGNOSIS — E1143 Type 2 diabetes mellitus with diabetic autonomic (poly)neuropathy: Secondary | ICD-10-CM | POA: Diagnosis present

## 2019-12-27 DIAGNOSIS — L89022 Pressure ulcer of left elbow, stage 2: Secondary | ICD-10-CM | POA: Diagnosis present

## 2019-12-27 DIAGNOSIS — Z833 Family history of diabetes mellitus: Secondary | ICD-10-CM

## 2019-12-27 DIAGNOSIS — B3781 Candidal esophagitis: Secondary | ICD-10-CM | POA: Diagnosis present

## 2019-12-27 DIAGNOSIS — N1831 Chronic kidney disease, stage 3a: Secondary | ICD-10-CM | POA: Diagnosis present

## 2019-12-27 DIAGNOSIS — R64 Cachexia: Secondary | ICD-10-CM | POA: Diagnosis present

## 2019-12-27 DIAGNOSIS — R911 Solitary pulmonary nodule: Secondary | ICD-10-CM | POA: Diagnosis present

## 2019-12-27 DIAGNOSIS — R45851 Suicidal ideations: Secondary | ICD-10-CM | POA: Diagnosis present

## 2019-12-27 DIAGNOSIS — Z20822 Contact with and (suspected) exposure to covid-19: Secondary | ICD-10-CM | POA: Diagnosis present

## 2019-12-27 DIAGNOSIS — L8990 Pressure ulcer of unspecified site, unspecified stage: Secondary | ICD-10-CM | POA: Diagnosis present

## 2019-12-27 DIAGNOSIS — D638 Anemia in other chronic diseases classified elsewhere: Secondary | ICD-10-CM | POA: Diagnosis present

## 2019-12-27 DIAGNOSIS — D849 Immunodeficiency, unspecified: Secondary | ICD-10-CM | POA: Diagnosis present

## 2019-12-27 DIAGNOSIS — E860 Dehydration: Secondary | ICD-10-CM | POA: Diagnosis present

## 2019-12-27 DIAGNOSIS — F32A Depression, unspecified: Secondary | ICD-10-CM | POA: Diagnosis present

## 2019-12-27 DIAGNOSIS — F329 Major depressive disorder, single episode, unspecified: Secondary | ICD-10-CM | POA: Diagnosis present

## 2019-12-27 DIAGNOSIS — E46 Unspecified protein-calorie malnutrition: Secondary | ICD-10-CM

## 2019-12-27 DIAGNOSIS — L89029 Pressure ulcer of left elbow, unspecified stage: Secondary | ICD-10-CM

## 2019-12-27 DIAGNOSIS — E43 Unspecified severe protein-calorie malnutrition: Secondary | ICD-10-CM | POA: Diagnosis present

## 2019-12-27 DIAGNOSIS — E1110 Type 2 diabetes mellitus with ketoacidosis without coma: Principal | ICD-10-CM | POA: Diagnosis present

## 2019-12-27 DIAGNOSIS — T383X6A Underdosing of insulin and oral hypoglycemic [antidiabetic] drugs, initial encounter: Secondary | ICD-10-CM | POA: Diagnosis present

## 2019-12-27 LAB — CBC WITH DIFFERENTIAL/PLATELET
Abs Immature Granulocytes: 0.04 10*3/uL (ref 0.00–0.07)
Basophils Absolute: 0 10*3/uL (ref 0.0–0.1)
Basophils Relative: 0 %
Eosinophils Absolute: 0 10*3/uL (ref 0.0–0.5)
Eosinophils Relative: 0 %
HCT: 31.5 % — ABNORMAL LOW (ref 39.0–52.0)
Hemoglobin: 10.2 g/dL — ABNORMAL LOW (ref 13.0–17.0)
Immature Granulocytes: 0 %
Lymphocytes Relative: 8 %
Lymphs Abs: 0.8 10*3/uL (ref 0.7–4.0)
MCH: 30.7 pg (ref 26.0–34.0)
MCHC: 32.4 g/dL (ref 30.0–36.0)
MCV: 94.9 fL (ref 80.0–100.0)
Monocytes Absolute: 0.3 10*3/uL (ref 0.1–1.0)
Monocytes Relative: 3 %
Neutro Abs: 9.7 10*3/uL — ABNORMAL HIGH (ref 1.7–7.7)
Neutrophils Relative %: 89 %
Platelets: 245 10*3/uL (ref 150–400)
RBC: 3.32 MIL/uL — ABNORMAL LOW (ref 4.22–5.81)
RDW: 13 % (ref 11.5–15.5)
WBC: 10.9 10*3/uL — ABNORMAL HIGH (ref 4.0–10.5)
nRBC: 0 % (ref 0.0–0.2)

## 2019-12-27 LAB — URINALYSIS, COMPLETE (UACMP) WITH MICROSCOPIC
Bilirubin Urine: NEGATIVE
Glucose, UA: >=500 mg/dL — AB
Hgb urine dipstick: NEGATIVE
Ketones, ur: 5 mg/dL — AB
Nitrite: NEGATIVE
Protein, ur: NEGATIVE mg/dL
Specific Gravity, Urine: 1.02 (ref 1.005–1.030)
pH: 5 (ref 5.0–8.0)

## 2019-12-27 LAB — BASIC METABOLIC PANEL
Anion gap: 13 (ref 5–15)
Anion gap: 11 (ref 5–15)
BUN: 57 mg/dL — ABNORMAL HIGH (ref 6–20)
BUN: 53 mg/dL — ABNORMAL HIGH (ref 6–20)
CO2: 19 mmol/L — ABNORMAL LOW (ref 22–32)
CO2: 19 mmol/L — ABNORMAL LOW (ref 22–32)
Calcium: 8 mg/dL — ABNORMAL LOW (ref 8.9–10.3)
Calcium: 8.1 mg/dL — ABNORMAL LOW (ref 8.9–10.3)
Chloride: 101 mmol/L (ref 98–111)
Chloride: 104 mmol/L (ref 98–111)
Creatinine, Ser: 2.52 mg/dL — ABNORMAL HIGH (ref 0.61–1.24)
Creatinine, Ser: 2.24 mg/dL — ABNORMAL HIGH (ref 0.61–1.24)
GFR calc Af Amer: 35 mL/min — ABNORMAL LOW (ref >60)
GFR calc Af Amer: 40 mL/min — ABNORMAL LOW (ref >60)
GFR calc non Af Amer: 30 mL/min — ABNORMAL LOW (ref >60)
GFR calc non Af Amer: 34 mL/min — ABNORMAL LOW (ref >60)
Glucose, Bld: 204 mg/dL — ABNORMAL HIGH (ref 70–99)
Glucose, Bld: 201 mg/dL — ABNORMAL HIGH (ref 70–99)
Potassium: 3.5 mmol/L (ref 3.5–5.1)
Potassium: 4.6 mmol/L (ref 3.5–5.1)
Sodium: 133 mmol/L — ABNORMAL LOW (ref 135–145)
Sodium: 134 mmol/L — ABNORMAL LOW (ref 135–145)

## 2019-12-27 LAB — COMPREHENSIVE METABOLIC PANEL
ALT: 10 U/L (ref 0–44)
AST: 12 U/L — ABNORMAL LOW (ref 15–41)
Albumin: 3.2 g/dL — ABNORMAL LOW (ref 3.5–5.0)
Alkaline Phosphatase: 75 U/L (ref 38–126)
Anion gap: 25 — ABNORMAL HIGH (ref 5–15)
BUN: 68 mg/dL — ABNORMAL HIGH (ref 6–20)
CO2: 15 mmol/L — ABNORMAL LOW (ref 22–32)
Calcium: 8.5 mg/dL — ABNORMAL LOW (ref 8.9–10.3)
Chloride: 93 mmol/L — ABNORMAL LOW (ref 98–111)
Creatinine, Ser: 3.15 mg/dL — ABNORMAL HIGH (ref 0.61–1.24)
GFR calc Af Amer: 26 mL/min — ABNORMAL LOW (ref >60)
GFR calc non Af Amer: 23 mL/min — ABNORMAL LOW (ref >60)
Glucose, Bld: 408 mg/dL — ABNORMAL HIGH (ref 70–99)
Potassium: 3.9 mmol/L (ref 3.5–5.1)
Sodium: 133 mmol/L — ABNORMAL LOW (ref 135–145)
Total Bilirubin: 2 mg/dL — ABNORMAL HIGH (ref 0.3–1.2)
Total Protein: 7 g/dL (ref 6.5–8.1)

## 2019-12-27 LAB — MAGNESIUM: Magnesium: 2.8 mg/dL — ABNORMAL HIGH (ref 1.7–2.4)

## 2019-12-27 LAB — BLOOD GAS, VENOUS
Acid-base deficit: 10.3 mmol/L — ABNORMAL HIGH (ref 0.0–2.0)
Bicarbonate: 16.2 mmol/L — ABNORMAL LOW (ref 20.0–28.0)
FIO2: 0.21
O2 Saturation: 49.7 %
Patient temperature: 37
pCO2, Ven: 37 mmHg — ABNORMAL LOW (ref 44.0–60.0)
pH, Ven: 7.25 (ref 7.250–7.430)
pO2, Ven: 32 mmHg (ref 32.0–45.0)

## 2019-12-27 LAB — GLUCOSE, CAPILLARY
Glucose-Capillary: 361 mg/dL — ABNORMAL HIGH (ref 70–99)
Glucose-Capillary: 333 mg/dL — ABNORMAL HIGH (ref 70–99)
Glucose-Capillary: 280 mg/dL — ABNORMAL HIGH (ref 70–99)
Glucose-Capillary: 204 mg/dL — ABNORMAL HIGH (ref 70–99)
Glucose-Capillary: 207 mg/dL — ABNORMAL HIGH (ref 70–99)
Glucose-Capillary: 139 mg/dL — ABNORMAL HIGH (ref 70–99)
Glucose-Capillary: 178 mg/dL — ABNORMAL HIGH (ref 70–99)
Glucose-Capillary: 182 mg/dL — ABNORMAL HIGH (ref 70–99)
Glucose-Capillary: 140 mg/dL — ABNORMAL HIGH (ref 70–99)
Glucose-Capillary: 151 mg/dL — ABNORMAL HIGH (ref 70–99)

## 2019-12-27 LAB — BETA-HYDROXYBUTYRIC ACID
Beta-Hydroxybutyric Acid: >8 mmol/L — ABNORMAL HIGH (ref 0.05–0.27)
Beta-Hydroxybutyric Acid: 2.23 mmol/L — ABNORMAL HIGH (ref 0.05–0.27)

## 2019-12-27 LAB — SARS CORONAVIRUS 2 (TAT 6-24 HRS): SARS Coronavirus 2: NEGATIVE

## 2019-12-27 LAB — LACTIC ACID, PLASMA
Lactic Acid, Venous: 2.9 mmol/L (ref 0.5–1.9)
Lactic Acid, Venous: 2.1 mmol/L (ref 0.5–1.9)

## 2019-12-27 LAB — PHOSPHORUS: Phosphorus: 5.3 mg/dL — ABNORMAL HIGH (ref 2.5–4.6)

## 2019-12-27 LAB — LIPASE, BLOOD: Lipase: 39 U/L (ref 11–51)

## 2019-12-27 LAB — MRSA PCR SCREENING: MRSA by PCR: NEGATIVE

## 2019-12-27 IMAGING — DX DG CHEST 1V PORT
1 series · 1 of 1 positions shown · non-contrast
Comparison: Chest radiographs, [DATE], CT chest, [DATE]

CLINICAL DATA: Weakness

EXAM:
PORTABLE CHEST 1 VIEW

[chest ap]
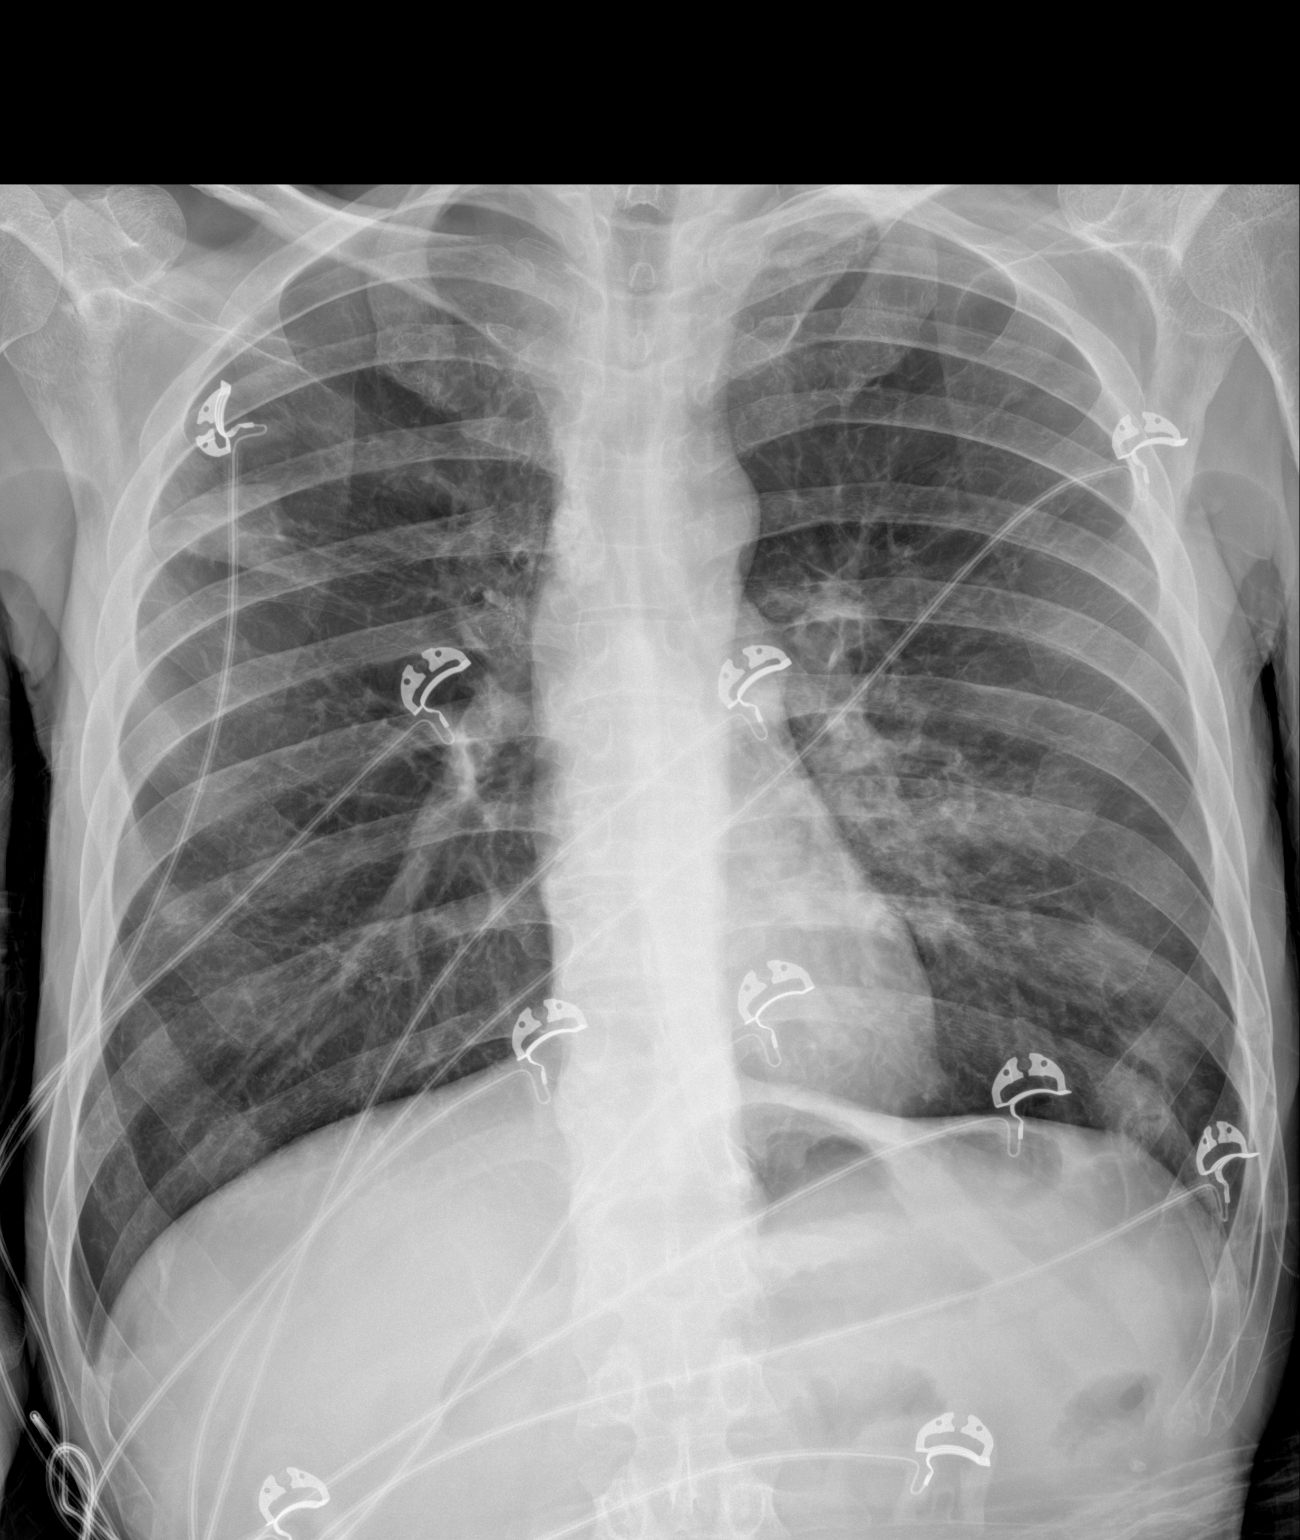

[1 of 1 positions shown; findings below may reference images not displayed]

FINDINGS: The heart size and mediastinal contours are within normal limits.
Unchanged nodule of the peripheral right upper lobe. The visualized
skeletal structures are unremarkable.
IMPRESSION: No acute abnormality of the lungs in AP portable projection.
Unchanged nodule of the peripheral right upper lobe, better
evaluated by prior CT.

## 2019-12-27 MED ORDER — SODIUM CHLORIDE 0.9 % IV SOLN: INTRAVENOUS | Status: DC

## 2019-12-27 MED ORDER — LACTATED RINGERS IV BOLUS
1000.0000 mL | Freq: Once | INTRAVENOUS | Status: AC
  Administered 2019-12-27: 1000 mL via INTRAVENOUS

## 2019-12-27 MED ORDER — POTASSIUM CHLORIDE 10 MEQ/100ML IV SOLN
10.0000 meq | INTRAVENOUS | Status: AC
  Administered 2019-12-27 (×2): 10 meq via INTRAVENOUS
  Filled 2019-12-27: qty 100

## 2019-12-27 MED ORDER — INSULIN ASPART 100 UNIT/ML ~~LOC~~ SOLN
0.0000 [IU] | Freq: Three times a day (TID) | SUBCUTANEOUS | Status: DC
  Administered 2019-12-28: 5 [IU] via SUBCUTANEOUS
  Administered 2019-12-28: 3 [IU] via SUBCUTANEOUS
  Administered 2019-12-29: 5 [IU] via SUBCUTANEOUS
  Administered 2019-12-30: 2 [IU] via SUBCUTANEOUS
  Filled 2019-12-27 (×6): qty 1

## 2019-12-27 MED ORDER — DEXTROSE 50 % IV SOLN: 0.0000 mL | INTRAVENOUS | Status: DC | PRN

## 2019-12-27 MED ORDER — CHLORHEXIDINE GLUCONATE CLOTH 2 % EX PADS
6.0000 | MEDICATED_PAD | Freq: Every day | CUTANEOUS | Status: DC
  Administered 2019-12-28 – 2020-01-03 (×7): 6 via TOPICAL

## 2019-12-27 MED ORDER — SODIUM CHLORIDE 0.9 % BOLUS PEDS
1000.0000 mL | Freq: Once | INTRAVENOUS | Status: AC
  Administered 2019-12-27: 1000 mL via INTRAVENOUS

## 2019-12-27 MED ORDER — ASPIRIN EC 81 MG PO TBEC
81.0000 mg | DELAYED_RELEASE_TABLET | Freq: Every day | ORAL | Status: DC
  Administered 2019-12-28 – 2020-01-03 (×7): 81 mg via ORAL
  Filled 2019-12-27 (×7): qty 1

## 2019-12-27 MED ORDER — DEXTROSE 50 % IV SOLN
0.0000 mL | INTRAVENOUS | Status: DC | PRN
  Administered 2019-12-28 – 2019-12-29 (×2): 50 mL via INTRAVENOUS
  Filled 2019-12-27 (×2): qty 50

## 2019-12-27 MED ORDER — POTASSIUM CHLORIDE 10 MEQ/100ML IV SOLN
10.0000 meq | INTRAVENOUS | Status: AC
  Administered 2019-12-27 (×2): 10 meq via INTRAVENOUS
  Filled 2019-12-27 (×2): qty 100

## 2019-12-27 MED ORDER — INSULIN ASPART 100 UNIT/ML ~~LOC~~ SOLN
0.0000 [IU] | Freq: Every day | SUBCUTANEOUS | Status: DC
  Filled 2019-12-27: qty 1

## 2019-12-27 MED ORDER — INSULIN REGULAR(HUMAN) IN NACL 100-0.9 UT/100ML-% IV SOLN
INTRAVENOUS | Status: DC
  Administered 2019-12-27: 6 [IU]/h via INTRAVENOUS
  Administered 2019-12-27: 0.6 [IU]/h via INTRAVENOUS
  Filled 2019-12-27: qty 100

## 2019-12-27 MED ORDER — INSULIN ASPART 100 UNIT/ML ~~LOC~~ SOLN
3.0000 [IU] | Freq: Three times a day (TID) | SUBCUTANEOUS | Status: DC
  Administered 2019-12-28 – 2019-12-30 (×4): 3 [IU] via SUBCUTANEOUS
  Filled 2019-12-27 (×4): qty 1

## 2019-12-27 MED ORDER — HEPARIN SODIUM (PORCINE) 5000 UNIT/ML IJ SOLN
5000.0000 [IU] | Freq: Three times a day (TID) | INTRAMUSCULAR | Status: DC
  Administered 2019-12-27 – 2020-01-03 (×19): 5000 [IU] via SUBCUTANEOUS
  Filled 2019-12-27 (×19): qty 1

## 2019-12-27 MED ORDER — DEXTROSE-NACL 5-0.45 % IV SOLN: INTRAVENOUS | Status: DC

## 2019-12-27 MED ORDER — INSULIN GLARGINE 100 UNIT/ML ~~LOC~~ SOLN
10.0000 [IU] | Freq: Every day | SUBCUTANEOUS | Status: DC
  Administered 2019-12-27 – 2019-12-28 (×2): 10 [IU] via SUBCUTANEOUS
  Filled 2019-12-27 (×7): qty 0.1

## 2019-12-27 MED ORDER — POTASSIUM CHLORIDE 10 MEQ/100ML IV SOLN
10.0000 meq | INTRAVENOUS | Status: AC
  Filled 2019-12-27 (×2): qty 100

## 2019-12-27 MED ORDER — INSULIN REGULAR(HUMAN) IN NACL 100-0.9 UT/100ML-% IV SOLN: INTRAVENOUS | Status: DC

## 2019-12-27 MED ORDER — ORAL CARE MOUTH RINSE
15.0000 mL | Freq: Two times a day (BID) | OROMUCOSAL | Status: DC
  Administered 2019-12-28 – 2020-01-03 (×7): 15 mL via OROMUCOSAL

## 2019-12-27 NOTE — ED Notes (Signed)
bair hugger placed on pt per Dr. Charna Archer

## 2019-12-27 NOTE — ED Notes (Signed)
[facility] case worker [phone] at bedside speaking with pt

## 2019-12-27 NOTE — H&P (Addendum)
History and Physical    Gregory Moody ERX:[id number] DOB: July 16, 1976 DOA: 12/27/2019  PCP: [employer]   Patient coming from: Home  I have personally briefly reviewed patient's old medical records in [place]  Chief Complaint: Weakness  HPI: Gregory Moody is a 44 y.o. male with medical history significant for diabetes mellitus with complications of gastroparesis and chronic kidney disease who was brought into the emergency room for evaluation of generalized weakness.  Per EMS, police were called to patient's home to serve a warrant on another resident.  When they noted the condition the patient was living in, APS was contacted and they recommended the patient be brought to the ER for further evaluation.  Patient states that he has not had anything to eat for at least 5 days and complained of nausea and vomiting but denied having any abdominal pain or changes in his bowel habits.  Per EMS, he told them that he was not eating because he wanted to die but does not have any active suicide ideations at this time.  Patient lives at home with his mother who he states is his caregiver.  He complains of being very weak to ambulate for about 5 days.  He denies having any chest pain, shortness of breath, orthopnea or paroxysmal nocturnal dyspnea.  He denies being any fever, chills, headaches or any mental status changes  ED Course: Patient was referred to the emergency room by APS because of poor living conditions.  Labs revealed significant hyperglycemia with an anion gap metabolic acidosis.  He was also hypotensive but asymptomatic.  He was started on an insulin drip and received IV fluid hydration.  He will be admitted to the hospital for further evaluation  Review of Systems: As per HPI otherwise 10 point review of systems negative.   Past Medical History:  Diagnosis Date  . COVID-19   . Diabetes mellitus without complication ([place])   . Gastroparesis   .  Tuberculosis     Past Surgical History:  Procedure Laterality Date  . ESOPHAGOGASTRODUODENOSCOPY N/A 02/03/2019   Procedure: ESOPHAGOGASTRODUODENOSCOPY (EGD);  Surgeon: Toledo, Benay Pike, MD;  Location: [facility] ENDOSCOPY;  Service: Gastroenterology;  Laterality: N/A;     reports that he has been smoking. He has never used smokeless tobacco. He reports that he does not drink alcohol or use drugs.  No Known Allergies  Family History  Problem Relation Age of Onset  . Diabetes Mother   . Diabetes Father      Prior to Admission medications   Medication Sig Start Date End Date Taking? Authorizing Provider  insulin NPH-regular Human (70-30) 100 UNIT/ML injection Inject 20 Units into the skin 2 (two) times daily with a meal. 25 units with breakfast in am and 35 units with supper 04/03/19  Yes [name], MD  aspirin EC 81 MG tablet Take 81 mg by mouth daily.    [provider]  loperamide (IMODIUM A-D) 2 MG tablet Take 1 tablet (2 mg total) by mouth 4 (four) times daily as needed for diarrhea or loose stools. Patient not taking: Reported on 12/27/2019 04/19/19   [name], MD  naproxen (NAPROSYN) 500 MG tablet Take 1 tablet (500 mg total) by mouth 2 (two) times daily with a meal. Patient not taking: Reported on 12/27/2019 10/10/19   [name], MD  ondansetron (ZOFRAN) 4 MG tablet Take 1 tablet (4 mg total) by mouth every 6 (six) hours as needed for nausea. Patient not taking: Reported on  12/27/2019 04/03/19   [name], MD  pantoprazole (PROTONIX) 40 MG tablet Take 1 tablet (40 mg total) by mouth 2 (two) times daily before a meal. Patient not taking: Reported on 12/27/2019 04/03/19   [name], MD    Physical Exam: Vitals:   12/27/19 1026 12/27/19 1028 12/27/19 1048  BP:  (!) 89/64   Pulse:  82   Resp:  18   Temp:   (!) 94.2 F (34.6 C)  TempSrc:   Rectal  SpO2:  95%   Weight: 45.4 kg    Height: 5\' 6"  (1.676 m)       Vitals:    12/27/19 1026 12/27/19 1028 12/27/19 1048  BP:  (!) 89/64   Pulse:  82   Resp:  18   Temp:   (!) 94.2 F (34.6 C)  TempSrc:   Rectal  SpO2:  95%   Weight: 45.4 kg    Height: 5\' 6"  (1.676 m)      Constitutional: NAD, alert and oriented x 3. Frail Eyes: PERRL, lids and conjunctivae normal ENMT: Mucous membranes dry Neck: normal, supple, no masses, no thyromegaly Respiratory: clear to auscultation bilaterally, no wheezing, no crackles. Normal respiratory effort. No accessory muscle use.  Cardiovascular: Regular rate and rhythm, no murmurs / rubs / gallops. No extremity edema. 2+ pedal pulses. No carotid bruits.  Abdomen: no tenderness, no masses palpated. No hepatosplenomegaly. Bowel sounds positive.  Musculoskeletal: no clubbing / cyanosis.  Skin: no rashes, skin tears to both elbows and wrists of upper extremities, ulcers.  Neurologic: No gross focal neurologic deficit.  Generalized weakness Psychiatric: Flat affect   Labs on Admission: I have personally reviewed following labs and imaging studies  CBC: Recent Labs  Lab 12/27/19 1036  WBC 10.9*  NEUTROABS 9.7*  HGB 10.2*  HCT 31.5*  MCV 94.9  PLT 025   Basic Metabolic Panel: Recent Labs  Lab 12/27/19 1036  NA 133*  K 3.9  CL 93*  CO2 15*  GLUCOSE 408*  BUN 68*  CREATININE 3.15*  CALCIUM 8.5*  MG 2.8*  PHOS 5.3*   GFR: Estimated Creatinine Clearance: 19.2 mL/min (A) (by C-G formula based on SCr of 3.15 mg/dL (H)). Liver Function Tests: Recent Labs  Lab 12/27/19 1036  AST 12*  ALT 10  ALKPHOS 75  BILITOT 2.0*  PROT 7.0  ALBUMIN 3.2*   Recent Labs  Lab 12/27/19 1036  LIPASE 39   No results for input(s): AMMONIA in the last 168 hours. Coagulation Profile: No results for input(s): INR, PROTIME in the last 168 hours. Cardiac Enzymes: No results for input(s): CKTOTAL, CKMB, CKMBINDEX, TROPONINI in the last 168 hours. BNP (last 3 results) No results for input(s): PROBNP in the last 8760  hours. HbA1C: No results for input(s): HGBA1C in the last 72 hours. CBG: Recent Labs  Lab 12/27/19 1219 12/27/19 1255 12/27/19 1356  GLUCAP 361* 333* 280*   Lipid Profile: No results for input(s): CHOL, HDL, LDLCALC, TRIG, CHOLHDL, LDLDIRECT in the last 72 hours. Thyroid Function Tests: No results for input(s): TSH, T4TOTAL, FREET4, T3FREE, THYROIDAB in the last 72 hours. Anemia Panel: No results for input(s): VITAMINB12, FOLATE, FERRITIN, TIBC, IRON, RETICCTPCT in the last 72 hours. Urine analysis:    Component Value Date/Time   COLORURINE YELLOW (A) 12/27/2019 1036   APPEARANCEUR HAZY (A) 12/27/2019 1036   LABSPEC 1.020 12/27/2019 1036   PHURINE 5.0 12/27/2019 1036   GLUCOSEU >=500 (A) 12/27/2019 1036   HGBUR NEGATIVE 12/27/2019 1036   BILIRUBINUR  NEGATIVE 12/27/2019 1036   KETONESUR 5 (A) 12/27/2019 1036   PROTEINUR NEGATIVE 12/27/2019 1036   NITRITE NEGATIVE 12/27/2019 1036   LEUKOCYTESUR SMALL (A) 12/27/2019 1036    Radiological Exams on Admission: DG Chest Portable 1 View  Result Date: 12/27/2019 CLINICAL DATA:  Weakness EXAM: PORTABLE CHEST 1 VIEW COMPARISON:  Chest radiographs, 11/18/2019, CT chest, 02/13/2019 FINDINGS: The heart size and mediastinal contours are within normal limits. Unchanged nodule of the peripheral right upper lobe. The visualized skeletal structures are unremarkable. IMPRESSION: No acute abnormality of the lungs in AP portable projection. Unchanged nodule of the peripheral right upper lobe, better evaluated by prior CT. Electronically Signed   By: Eddie Candle M.D.   On: 12/27/2019 10:47    EKG: Independently reviewed.  Sinus rhythm Premature ventricular complexes  Assessment/Plan Principal Problem:   DKA (diabetic ketoacidoses) (HCC) Active Problems:   Protein-calorie malnutrition, severe   GERD (gastroesophageal reflux disease)   Pressure injury of skin   Anemia of chronic disease   Depression     Diabetic ketoacidosis Secondary  to medication non compliance and poor oral intake Patient noted to have significant hyperglycemia with an anion gap of 25 on admission Start patient on an insulin drip Aggressive IV fluid hydration Serial electrolytes per protocol   Acute kidney injury Prerenal and secondary to poor oral intake and dehydration Baseline serum creatinine is 1.3 and on admission it is 3.15 Will hydrate patient Repeat renal parameters in am    Depression Patient verbalized not wanting to live and had been starving himself Has no active suicidal ideation at this time Will consult psychaitry   Protein calorie malnutrition (Severe) Will consult dietician Patient will benefit from nutritional supplements   Anemia of chronic disease Stable   Poor living condition Patient was referred to the ER by APS because of poor living conditions Patient may need alternative living conditions upon discharge   DVT prophylaxis: Heparin Code Status: Full code Family Communication: Plan of care was discussed with patient at the bedside. He verbalizes understanding and agrees with the plan Disposition Plan: Will need placement upon discharge Consults called: Case management, Psychaitry    Gregory Griffie MD [facility]     12/27/2019, 2:00 PM

## 2019-12-27 NOTE — ED Notes (Signed)
Pt cleaned of stool and changed into gown from wet clothing. Pt's penis had large amount of thick green drainage that was cleaned by this RN as well.

## 2019-12-27 NOTE — ED Notes (Signed)
Per APS caseworker pt voices concerns stating he does not want to live anymore and wants to go "up there". This RN asked pt if he had thoughts of hurting himself or others and he shook his head no. This RN asked pt if he was going to hurt himself while he was in the hospital and he states "no". When asked pt if he wants to "no longer live" pt states "idk"

## 2019-12-27 NOTE — ED Notes (Signed)
Rainbow tubes sent to lab.  

## 2019-12-27 NOTE — ED Notes (Signed)
EDP [place] notified in person that pt's BP 74/52 with MAP 59. Pt currently asleep. No new orders. Messaged provider Agbata with BP.

## 2019-12-27 NOTE — ED Notes (Signed)
Per lisa in pharmacy, LR and K+ IV medications can be run together

## 2019-12-27 NOTE — ED Triage Notes (Signed)
pt arrives via [facility] from home with reports of non compliance with diabetic medications and reports of trying to starve himself x 5 days. Per EMS a warrant was served to the home and Event organiser called EMS due to pt's state of health. PT A&Ox4 and speaking with Dr. Charna Archer at this time. Pt reports to Dr. Charna Archer that he has not been eating because "he doesnt want to". Pt reports that he is not wanting to hurt himself. Pt vomiting brown thin liquid at this time. Pt has open skin tears to bilateral elbows. Pt reports he does not feel safe at home because his mother "works all day". Pt reports she goes to work and leaves him at  Home to care for himself. PT reports that he has not walked in 3 weeks.

## 2019-12-27 NOTE — ED Notes (Signed)
Respiratory informed VBG sent to lab 

## 2019-12-27 NOTE — ED Notes (Signed)
Report provided to Ronalee Belts, [place]

## 2019-12-27 NOTE — Progress Notes (Signed)
Inpatient Diabetes Program Recommendations  AACE/ADA: New Consensus Statement on Inpatient Glycemic Control (2015)  Target Ranges:  Prepandial:   less than 140 mg/dL      Peak postprandial:   less than 180 mg/dL (1-2 hours)      Critically ill patients:  140 - 180 mg/dL   Lab Results  Component Value Date   GLUCAP 280 (H) 12/27/2019   HGBA1C 8.1 (H) 03/30/2019    Review of Glycemic Control Results for Gregory Moody, Gregory Moody (MRN [id number]) as of 12/27/2019 14:22  Ref. Range 12/27/2019 12:19 12/27/2019 12:55 12/27/2019 13:56  Glucose-Capillary Latest Ref Range: 70 - 99 mg/dL 361 (H) 333 (H) 280 (H)   Diabetes history: DM Outpatient Diabetes medications: 70/30 insulin 25 units am + 35 units pm Current orders for Inpatient glycemic control: IV insulin  Inpatient Diabetes Program Recommendations:   Noted patient admitted in DKA and positive for Covid 19. Will follow patient during hospitalization.  Thank you, [name]. Irwin Toran, RN, MSN, CDE  Diabetes Coordinator Inpatient Glycemic Control Team Team Pager [phone] (8am-5pm) 12/27/2019 2:31 PM

## 2019-12-27 NOTE — ED Notes (Signed)
Pts brief changed. Black tarry stool present. Pt has pressure sores with scabs to bilateral knees, elbows and break down to sacral area.

## 2019-12-27 NOTE — ED Provider Notes (Signed)
[facility] Emergency Department Provider Note   ____________________________________________   First MD Initiated Contact with Patient 12/27/19 1018     (approximate)  I have reviewed the triage vital signs and the nursing notes.   HISTORY  Chief Complaint Weakness    HPI Gregory Moody is a 44 y.o. male with past medical history of diabetes, gastroparesis, and aspiration who presents to the ED for generalized weakness.  History is limited due to patient's weakness.  Per EMS, police were called to patient's home to serve a warrant on a another resident.  When they noted the conditions patient was living in, APS was contacted and it was recommended patient be brought to the ED for further evaluation.  Patient states that he has not been eating for at least the past 5 days, but he is unable to state why and says only "I did not want to".  He had reported thoughts of wanting to die to EMS, but currently denies any suicidal ideation.  He does admit that he has not been compliant with diabetic medications.  He denies any alcohol or drug abuse.  He had some nausea and vomiting with EMS, but denies any abdominal pain.        Past Medical History:  Diagnosis Date  . COVID-19   . Diabetes mellitus without complication ([place])   . Gastroparesis   . Tuberculosis     Patient Active Problem List   Diagnosis Date Noted  . Anemia of chronic disease 04/25/2019  . Hypomagnesemia 04/25/2019  . Hypotension 04/23/2019  . CAP (community acquired pneumonia) due to MSSA (methicillin sensitive Staphylococcus aureus) ([place]) 04/14/2019  . COVID-19 virus infection 04/14/2019  . Nausea and vomiting 04/02/2019  . Nausea vomiting and diarrhea   . Pressure injury of skin 03/31/2019  . Acute renal failure (ARF) ([place]) 03/06/2019  . CAP (community acquired pneumonia) 02/14/2019  . AKI (acute kidney injury) ([place]) 02/02/2019  . ARF (acute renal failure) ([place]) 01/03/2019  .  Malnutrition of moderate degree 02/24/2018  . GERD (gastroesophageal reflux disease) 02/23/2018  . Diabetic gastroparesis ([place]) 02/23/2018  . Diabetic foot ulcer ([place]) 02/23/2018  . Aspiration pneumonia ([place]) 04/21/2017  . Hypoglycemia 04/21/2017  . Diabetes mellitus with hyperglycemia ([place]) 04/21/2017  . Hip fracture, unspecified laterality, closed, initial encounter ([place]) 04/17/2017  . Hyperglycemia 04/17/2017  . Protein-calorie malnutrition, severe 04/09/2016  . Cavitary lesion of lung 04/06/2016    Past Surgical History:  Procedure Laterality Date  . ESOPHAGOGASTRODUODENOSCOPY N/A 02/03/2019   Procedure: ESOPHAGOGASTRODUODENOSCOPY (EGD);  Surgeon: Toledo, Benay Pike, MD;  Location: [facility] ENDOSCOPY;  Service: Gastroenterology;  Laterality: N/A;    Prior to Admission medications   Medication Sig Start Date End Date Taking? Authorizing Provider  insulin NPH-regular Human (70-30) 100 UNIT/ML injection Inject 20 Units into the skin 2 (two) times daily with a meal. 25 units with breakfast in am and 35 units with supper 04/03/19  Yes [name], MD  aspirin EC 81 MG tablet Take 81 mg by mouth daily.    [provider]  loperamide (IMODIUM A-D) 2 MG tablet Take 1 tablet (2 mg total) by mouth 4 (four) times daily as needed for diarrhea or loose stools. Patient not taking: Reported on 12/27/2019 04/19/19   [name], MD  naproxen (NAPROSYN) 500 MG tablet Take 1 tablet (500 mg total) by mouth 2 (two) times daily with a meal. Patient not taking: Reported on 12/27/2019 10/10/19   [name], MD  ondansetron [facility]) 4  MG tablet Take 1 tablet (4 mg total) by mouth every 6 (six) hours as needed for nausea. Patient not taking: Reported on 12/27/2019 04/03/19   [name], MD  pantoprazole (PROTONIX) 40 MG tablet Take 1 tablet (40 mg total) by mouth 2 (two) times daily before a meal. Patient not taking: Reported on 12/27/2019 04/03/19   [name], MD     Allergies Patient has no known allergies.  Family History  Problem Relation Age of Onset  . Diabetes Mother   . Diabetes Father     Social History Social History   Tobacco Use  . Smoking status: Current Some Day Smoker  . Smokeless tobacco: Never Used  Substance Use Topics  . Alcohol use: No  . Drug use: No    Review of Systems  Constitutional: No fever/chills, positive for generalized weakness. Eyes: No visual changes. ENT: No sore throat. Cardiovascular: Denies chest pain. Respiratory: Denies shortness of breath. Gastrointestinal: No abdominal pain.  Positive for nausea and vomiting.  No diarrhea.  No constipation. Genitourinary: Negative for dysuria. Musculoskeletal: Negative for back pain. Skin: Negative for rash. Neurological: Negative for headaches, focal weakness or numbness.  ____________________________________________   PHYSICAL EXAM:  VITAL SIGNS: ED Triage Vitals  Enc Vitals Group     BP      Pulse      Resp      Temp      Temp src      SpO2      Weight      Height      Head Circumference      Peak Flow      Pain Score      Pain Loc      Pain Edu?      Excl. in [place]?     Constitutional: Alert and oriented.  Extremely thin and cachectic, malnourished appearing. Eyes: Conjunctivae are normal. Head: Atraumatic. Nose: No congestion/rhinnorhea. Mouth/Throat: Mucous membranes are dry. Neck: Normal ROM Cardiovascular: Normal rate, regular rhythm. Grossly normal heart sounds. Respiratory: Normal respiratory effort.  No retractions. Lungs CTAB. Gastrointestinal: Soft and nontender. No distention. Genitourinary: deferred Musculoskeletal: No lower extremity tenderness nor edema. Neurologic:  Normal speech and language. No gross focal neurologic deficits are appreciated. Skin: Multiple pressure injuries to various areas of skin including right lateral wrist, left dorsal wrist, left elbow, right elbow.  No signs of overlying  infection. Psychiatric: Mood and affect are normal. Speech and behavior are normal.  ____________________________________________   LABS (all labs ordered are listed, but only abnormal results are displayed)  Labs Reviewed  CBC WITH DIFFERENTIAL/PLATELET - Abnormal; Notable for the following components:      Result Value   WBC 10.9 (*)    RBC 3.32 (*)    Hemoglobin 10.2 (*)    HCT 31.5 (*)    Neutro Abs 9.7 (*)    All other components within normal limits  COMPREHENSIVE METABOLIC PANEL - Abnormal; Notable for the following components:   Sodium 133 (*)    Chloride 93 (*)    CO2 15 (*)    Glucose, Bld 408 (*)    BUN 68 (*)    Creatinine, Ser 3.15 (*)    Calcium 8.5 (*)    Albumin 3.2 (*)    AST 12 (*)    Total Bilirubin 2.0 (*)    GFR calc non Af Amer 23 (*)    GFR calc Af Amer 26 (*)    Anion gap 25 (*)  All other components within normal limits  MAGNESIUM - Abnormal; Notable for the following components:   Magnesium 2.8 (*)    All other components within normal limits  PHOSPHORUS - Abnormal; Notable for the following components:   Phosphorus 5.3 (*)    All other components within normal limits  URINALYSIS, COMPLETE (UACMP) WITH MICROSCOPIC - Abnormal; Notable for the following components:   Color, Urine YELLOW (*)    APPearance HAZY (*)    Glucose, UA >=500 (*)    Ketones, ur 5 (*)    Leukocytes,Ua SMALL (*)    Bacteria, UA RARE (*)    All other components within normal limits  CULTURE, BLOOD (ROUTINE X 2)  CULTURE, BLOOD (ROUTINE X 2)  SARS CORONAVIRUS 2 (TAT 6-24 HRS)  LIPASE, BLOOD  LACTIC ACID, PLASMA  LACTIC ACID, PLASMA  BETA-HYDROXYBUTYRIC ACID  BETA-HYDROXYBUTYRIC ACID  BLOOD GAS, VENOUS  CBG MONITORING, ED   ____________________________________________  EKG  ED ECG REPORT I, Blake Divine, the attending physician, personally viewed and interpreted this ECG.   Date: 12/27/2019  EKG Time: 10:29  Rate: 81  Rhythm: normal sinus rhythm, PVC's  noted  Axis: Normal  Intervals:Prolonged QT  ST&T Change: Nonspecific T wave changes   PROCEDURES  Procedure(s) performed (including Critical Care):  .Critical Care Performed by: Blake Divine, MD Authorized by: Blake Divine, MD   Critical care provider statement:    Critical care time (minutes):  45   Critical care time was exclusive of:  Separately billable procedures and treating other patients and teaching time   Critical care was necessary to treat or prevent imminent or life-threatening deterioration of the following conditions:  Renal failure and endocrine crisis   Critical care was time spent personally by me on the following activities:  Discussions with consultants, evaluation of patient's response to treatment, examination of patient, ordering and performing treatments and interventions, ordering and review of laboratory studies, ordering and review of radiographic studies, pulse oximetry, re-evaluation of patient's condition, obtaining history from patient or surrogate and review of old charts   I assumed direction of critical care for this patient from another provider in my specialty: no       ____________________________________________   INITIAL IMPRESSION / ASSESSMENT AND PLAN / ED COURSE       44 year old male with history of diabetes and gastroparesis presents to the ED for generalized weakness after being found living in very poor conditions by police.  He appears extremely thin and malnourished with dry mucous membranes, will check labs for evidence of DKA, other electrolyte abnormality, start hydration with IV fluids.  Patient also noted to be hypothermic and hypotensive with no obvious infectious source.  We will screen chest x-ray given his history of aspiration, also check UA.  While he has multiple pressure injuries to his skin, none of these appear infected and he has no abdominal tenderness.  Patient placed on Bair hugger for hypothermia.  Lab work  concerning for DKA, will add on VBG and beta hydroxybutyrate, start patient on insulin drip.  Patient given initial 1 L fluid bolus, will hydrate with an additional liter.  Blood pressure is borderline low, however he seems to be chronically hypotensive on prior visits and MAP remains greater than 65 here in the ED.  He would benefit from psychiatric consultation at some point given suicidal ideation expressed to EMS.  UA and chest x-ray showed no evidence of infectious process.  Case discussed with hospitalist, who accepts patient for admission.  ____________________________________________   FINAL CLINICAL IMPRESSION(S) / ED DIAGNOSES  Final diagnoses:  Diabetic ketoacidosis without coma associated with type 2 diabetes mellitus (HCC)  Malnutrition, unspecified type ([place])  Pressure injury of skin of left elbow, unspecified injury stage  AKI (acute kidney injury) [facility])     ED Discharge Orders    None       Note:  This document was prepared using Dragon voice recognition software and may include unintentional dictation errors.   Blake Divine, MD 12/27/19 1226

## 2019-12-27 NOTE — ED Notes (Signed)
Report given to Rachel, RN.

## 2019-12-27 NOTE — Consult Note (Signed)
[facility] Consult   Reason for Consult: Suicidal ideation Referring Physician: Dr. Francine Graven Patient Identification: Gregory Moody MRN:  [id number] Principal Diagnosis: DKA (diabetic ketoacidoses) [facility]) Diagnosis:  Principal Problem:   DKA (diabetic ketoacidoses) ([place]) Active Problems:   Protein-calorie malnutrition, severe   GERD (gastroesophageal reflux disease)   Pressure injury of skin   Anemia of chronic disease   Depression   DKA, type 2 ([place])   Total Time spent with patient: 30 minutes  Subjective:   Gregory Moody is a 44 y.o. male patient admitted with DKA-like symptoms after not taking his insulin.  Psychiatry was consulted for concerns of patient being suicidal.  HPI:  Patient is a 44 year old male with a history of anxiety and depression who presents to the ED in DKA after not taking his medications for several days.  Per EMS, police noticed patient's living conditions and contacted APS who recommended patient be brought to the ED.  Patient reports not eating for at least the past 5 days.  Upon evaluation patient acknowledges severe depression and states that he does not want to live anymore.  Patient states that he is under a lot of stress due to family issues from his mother and his son, but refuses to elaborate further.  He is agreeable to the plan for inpatient hospitalization for purposes of medication and stabilization.  Patient will however require some time on the inpatient medical floors due to his DKA.  Past Psychiatric History: Patient acknowledges prior inpatient hospitalization  Risk to Self:  Yes Risk to Others:  No Prior Inpatient Therapy:  Yes Prior Outpatient Therapy:  No  Past Medical History:  Past Medical History:  Diagnosis Date  . COVID-19   . Diabetes mellitus without complication ([place])   . Gastroparesis   . Tuberculosis     Past Surgical History:  Procedure Laterality Date  . ESOPHAGOGASTRODUODENOSCOPY  N/A 02/03/2019   Procedure: ESOPHAGOGASTRODUODENOSCOPY (EGD);  Surgeon: [place], Benay Pike, MD;  Location: [facility] ENDOSCOPY;  Service: Gastroenterology;  Laterality: N/A;   Family History:  Family History  Problem Relation Age of Onset  . Diabetes Mother   . Diabetes Father    Family Psychiatric  History: Unknown Social History:  Social History   Substance and Sexual Activity  Alcohol Use No     Social History   Substance and Sexual Activity  Drug Use No    Social History   Socioeconomic History  . Marital status: Single    Spouse name: Not on file  . Number of children: 3  . Years of education: Not on file  . Highest education level: Not on file  Occupational History  . Occupation: unemployed   Tobacco Use  . Smoking status: Current Some Day Smoker  . Smokeless tobacco: Never Used  Substance and Sexual Activity  . Alcohol use: No  . Drug use: No  . Sexual activity: Not on file  Other Topics Concern  . Not on file  Social History Narrative   Resides with daughter only weekends    Social Determinants of Health   Financial Resource Strain: Low Risk   . Difficulty of Paying Living Expenses: Not hard at all  Food Insecurity: No Food Insecurity  . Worried About Charity fundraiser in the Last Year: Never true  . Ran Out of Food in the Last Year: Never true  Transportation Needs: No Transportation Needs  . Lack of Transportation (Medical): No  . Lack of Transportation (Non-Medical): No  Physical Activity: Unknown  .  Days of Exercise per Week: Patient refused  . Minutes of Exercise per Session: Patient refused  Stress: No Stress Concern Present  . Feeling of Stress : Not at all  Social Connections: Unknown  . Frequency of Communication with Friends and Family: Patient refused  . Frequency of Social Gatherings with Friends and Family: Patient refused  . Attends Religious Services: Patient refused  . Active Member of Clubs or Organizations: Patient refused  . Attends  Archivist Meetings: Patient refused  . Marital Status: Patient refused   Additional Social History:    Allergies:  No Known Allergies  Labs:  Results for orders placed or performed during the hospital encounter of 12/27/19 (from the past 48 hour(s))  CBC with Differential     Status: Abnormal   Collection Time: 12/27/19 10:36 AM  Result Value Ref Range   WBC 10.9 (H) 4.0 - 10.5 K/uL   RBC 3.32 (L) 4.22 - 5.81 MIL/uL   Hemoglobin 10.2 (L) 13.0 - 17.0 g/dL   HCT 31.5 (L) 39.0 - 52.0 %   MCV 94.9 80.0 - 100.0 fL   MCH 30.7 26.0 - 34.0 pg   MCHC 32.4 30.0 - 36.0 g/dL   RDW 13.0 11.5 - 15.5 %   Platelets 245 150 - 400 K/uL   nRBC 0.0 0.0 - 0.2 %   Neutrophils Relative % 89 %   Neutro Abs 9.7 (H) 1.7 - 7.7 K/uL   Lymphocytes Relative 8 %   Lymphs Abs 0.8 0.7 - 4.0 K/uL   Monocytes Relative 3 %   Monocytes Absolute 0.3 0.1 - 1.0 K/uL   Eosinophils Relative 0 %   Eosinophils Absolute 0.0 0.0 - 0.5 K/uL   Basophils Relative 0 %   Basophils Absolute 0.0 0.0 - 0.1 K/uL   Immature Granulocytes 0 %   Abs Immature Granulocytes 0.04 0.00 - 0.07 K/uL    Comment: Performed at [facility], [address] [place], [place] [address]  Comprehensive metabolic panel     Status: Abnormal   Collection Time: 12/27/19 10:36 AM  Result Value Ref Range   Sodium 133 (L) 135 - 145 mmol/L    Comment: LYTES REPEATED DAS   Potassium 3.9 3.5 - 5.1 mmol/L   Chloride 93 (L) 98 - 111 mmol/L   CO2 15 (L) 22 - 32 mmol/L   Glucose, Bld 408 (H) 70 - 99 mg/dL    Comment: Glucose reference range applies only to samples taken after fasting for at least 8 hours.   BUN 68 (H) 6 - 20 mg/dL   Creatinine, Ser 3.15 (H) 0.61 - 1.24 mg/dL   Calcium 8.5 (L) 8.9 - 10.3 mg/dL   Total Protein 7.0 6.5 - 8.1 g/dL   Albumin 3.2 (L) 3.5 - 5.0 g/dL   AST 12 (L) 15 - 41 U/L   ALT 10 0 - 44 U/L   Alkaline Phosphatase 75 38 - 126 U/L   Total Bilirubin 2.0 (H) 0.3 - 1.2 mg/dL   GFR calc non Af Amer 23  (L) >60 mL/min   GFR calc Af Amer 26 (L) >60 mL/min   Anion gap 25 (H) 5 - 15    Comment: Performed at [facility], [address] [place], [place] [address]  Lipase, blood     Status: None   Collection Time: 12/27/19 10:36 AM  Result Value Ref Range   Lipase 39 11 - 51 U/L    Comment: Performed at [facility], [address] [place], [place] [address]  Magnesium     Status: Abnormal   Collection Time: 12/27/19 10:36 AM  Result Value Ref Range   Magnesium 2.8 (H) 1.7 - 2.4 mg/dL    Comment: Performed at [facility], [address] [place], [place] [address]  Phosphorus     Status: Abnormal   Collection Time: 12/27/19 10:36 AM  Result Value Ref Range   Phosphorus 5.3 (H) 2.5 - 4.6 mg/dL    Comment: Performed at [facility], [address] [place], [place] [address]  Urinalysis, Complete w Microscopic     Status: Abnormal   Collection Time: 12/27/19 10:36 AM  Result Value Ref Range   Color, Urine YELLOW (A) YELLOW   APPearance HAZY (A) CLEAR   Specific Gravity, Urine 1.020 1.005 - 1.030   pH 5.0 5.0 - 8.0   Glucose, UA >=500 (A) NEGATIVE mg/dL   Hgb urine dipstick NEGATIVE NEGATIVE   Bilirubin Urine NEGATIVE NEGATIVE   Ketones, ur 5 (A) NEGATIVE mg/dL   Protein, ur NEGATIVE NEGATIVE mg/dL   Nitrite NEGATIVE NEGATIVE   Leukocytes,Ua SMALL (A) NEGATIVE   RBC / HPF 0-5 0 - 5 RBC/hpf   WBC, UA 0-5 0 - 5 WBC/hpf   Bacteria, UA RARE (A) NONE SEEN   Squamous Epithelial / LPF 0-5 0 - 5   Mucus PRESENT    Hyaline Casts, UA PRESENT     Comment: Performed at [facility], [address] [place], [place] [address]  Lactic acid, plasma     Status: Abnormal   Collection Time: 12/27/19 10:37 AM  Result Value Ref Range   Lactic Acid, Venous 2.9 (HH) 0.5 - 1.9 mmol/L    Comment: CRITICAL RESULT CALLED TO, READ BACK BY AND VERIFIED WITH JESSICA FULCHER AT 1236 12/27/19 DAS Performed at [facility], [address]  [place], [place] [address]   SARS CORONAVIRUS 2 (TAT 6-24 HRS) Nasopharyngeal Nasopharyngeal Swab     Status: None   Collection Time: 12/27/19 11:59 AM   Specimen: Nasopharyngeal Swab  Result Value Ref Range   SARS Coronavirus 2 NEGATIVE NEGATIVE    Comment: (NOTE) SARS-CoV-2 target nucleic acids are NOT DETECTED. The SARS-CoV-2 RNA is generally detectable in upper and lower respiratory specimens during the acute phase of infection. Negative results do not preclude SARS-CoV-2 infection, do not rule out co-infections with other pathogens, and should not be used as the sole basis for treatment or other patient management decisions. Negative results must be combined with clinical observations, patient history, and epidemiological information. The expected result is Negative. Fact Sheet for Patients: SugarRoll.be Fact Sheet for Healthcare Providers: https://www.woods-mathews.com/ This test is not yet approved or cleared by the [place] FDA and  has been authorized for detection and/or diagnosis of SARS-CoV-2 by FDA under an Emergency Use Authorization (EUA). This EUA will remain  in effect (meaning this test can be used) for the duration of the COVID-19 declaration under Section 56 4(b)(1) of the Act, 21 U.S.C. section 360bbb-3(b)(1), unless the authorization is terminated or revoked sooner. Performed at [facility], [address] [place], [place] [address]   Blood gas, venous     Status: Abnormal   Collection Time: 12/27/19 11:59 AM  Result Value Ref Range   FIO2 0.21    Delivery systems ROOM AIR    pH, Ven 7.25 7.250 - 7.430   pCO2, Ven 37 (L) 44.0 - 60.0 mmHg   pO2, Ven 32.0 32.0 -  45.0 mmHg   Bicarbonate 16.2 (L) 20.0 - 28.0 mmol/L   Acid-base deficit 10.3 (H) 0.0 - 2.0 mmol/L   O2 Saturation 49.7 %   Patient temperature 37.0    Collection site VENOUS    Sample type VENOUS     Comment: Performed at [facility],  [address] [place], [place] [address]  Beta-hydroxybutyric acid     Status: Abnormal   Collection Time: 12/27/19 12:00 PM  Result Value Ref Range   Beta-Hydroxybutyric Acid >8.00 (H) 0.05 - 0.27 mmol/L    Comment: RESULT CONFIRMED BY MANUAL DILUTION Performed at [facility], [address] [place], [place] [address]   Glucose, capillary     Status: Abnormal   Collection Time: 12/27/19 12:19 PM  Result Value Ref Range   Glucose-Capillary 361 (H) 70 - 99 mg/dL    Comment: Glucose reference range applies only to samples taken after fasting for at least 8 hours.  Glucose, capillary     Status: Abnormal   Collection Time: 12/27/19 12:55 PM  Result Value Ref Range   Glucose-Capillary 333 (H) 70 - 99 mg/dL    Comment: Glucose reference range applies only to samples taken after fasting for at least 8 hours.  Lactic acid, plasma     Status: Abnormal   Collection Time: 12/27/19  1:05 PM  Result Value Ref Range   Lactic Acid, Venous 2.1 (HH) 0.5 - 1.9 mmol/L    Comment: CRITICAL VALUE NOTED. VALUE IS CONSISTENT WITH PREVIOUSLY REPORTED/CALLED VALUE / [place] Performed at [facility], [address] [place], [place] [address]   Glucose, capillary     Status: Abnormal   Collection Time: 12/27/19  1:56 PM  Result Value Ref Range   Glucose-Capillary 280 (H) 70 - 99 mg/dL    Comment: Glucose reference range applies only to samples taken after fasting for at least 8 hours.  Glucose, capillary     Status: Abnormal   Collection Time: 12/27/19  3:06 PM  Result Value Ref Range   Glucose-Capillary 204 (H) 70 - 99 mg/dL    Comment: Glucose reference range applies only to samples taken after fasting for at least 8 hours.  Glucose, capillary     Status: Abnormal   Collection Time: 12/27/19  3:52 PM  Result Value Ref Range   Glucose-Capillary 207 (H) 70 - 99 mg/dL    Comment: Glucose reference range applies only to samples taken after fasting for at least 8 hours.  Basic  metabolic panel     Status: Abnormal   Collection Time: 12/27/19  3:59 PM  Result Value Ref Range   Sodium 133 (L) 135 - 145 mmol/L   Potassium 3.5 3.5 - 5.1 mmol/L   Chloride 101 98 - 111 mmol/L   CO2 19 (L) 22 - 32 mmol/L   Glucose, Bld 204 (H) 70 - 99 mg/dL    Comment: Glucose reference range applies only to samples taken after fasting for at least 8 hours.   BUN 57 (H) 6 - 20 mg/dL   Creatinine, Ser 2.52 (H) 0.61 - 1.24 mg/dL   Calcium 8.0 (L) 8.9 - 10.3 mg/dL   GFR calc non Af Amer 30 (L) >60 mL/min   GFR calc Af Amer 35 (L) >60 mL/min   Anion gap 13 5 - 15    Comment: Performed at [facility], [address] [place], [place] [address]  Glucose, capillary     Status: Abnormal   Collection Time: 12/27/19  5:32 PM  Result Value Ref Range   Glucose-Capillary 139 (H) 70 - 99 mg/dL    Comment: Glucose reference range applies only to samples taken after fasting for at least 8 hours.    Current Facility-Administered Medications  Medication Dose Route Frequency Provider Last Rate Last Admin  . 0.9 %  sodium chloride infusion   Intravenous Continuous Blake Divine, MD 75 mL/hr at 12/27/19 1214 New Bag at 12/27/19 1214  . aspirin EC tablet 81 mg  81 mg Oral Daily Agbata, Tochukwu, MD      . dextrose 5 %-0.45 % sodium chloride infusion   Intravenous Continuous Blake Divine, MD 75 mL/hr at 12/27/19 1508 New Bag at 12/27/19 1508  . dextrose 50 % solution 0-50 mL  0-50 mL Intravenous PRN Blake Divine, MD      . heparin injection 5,000 Units  5,000 Units Subcutaneous Q8H Agbata, Tochukwu, MD      . insulin regular, human (MYXREDLIN) 100 units/ 100 mL infusion   Intravenous Continuous Blake Divine, MD 0.6 mL/hr at 12/27/19 1737 0.6 Units/hr at 12/27/19 1737  . potassium chloride 10 mEq in 100 mL IVPB  10 mEq Intravenous Q1 Hr x 2 Rito Ehrlich A, RPH 100 mL/hr at 12/27/19 1804 10 mEq at 12/27/19 1804   Current Outpatient Medications  Medication Sig Dispense Refill  .  insulin NPH-regular Human (70-30) 100 UNIT/ML injection Inject 20 Units into the skin 2 (two) times daily with a meal. 25 units with breakfast in am and 35 units with supper 10 mL 11  . aspirin EC 81 MG tablet Take 81 mg by mouth daily.    [name] loperamide (IMODIUM A-D) 2 MG tablet Take 1 tablet (2 mg total) by mouth 4 (four) times daily as needed for diarrhea or loose stools. (Patient not taking: Reported on 12/27/2019) 20 tablet 0  . naproxen (NAPROSYN) 500 MG tablet Take 1 tablet (500 mg total) by mouth 2 (two) times daily with a meal. (Patient not taking: Reported on 12/27/2019) 20 tablet 2  . ondansetron (ZOFRAN) 4 MG tablet Take 1 tablet (4 mg total) by mouth every 6 (six) hours as needed for nausea. (Patient not taking: Reported on 12/27/2019) 20 tablet 0  . pantoprazole (PROTONIX) 40 MG tablet Take 1 tablet (40 mg total) by mouth 2 (two) times daily before a meal. (Patient not taking: Reported on 12/27/2019) 60 tablet 2    Musculoskeletal: Strength & Muscle Tone: within normal limits Gait & Station: normal Patient leans: N/A  Psychiatric Specialty Exam: Physical Exam  Review of Systems  Blood pressure 94/72, pulse 73, temperature (!) 94.2 F (34.6 C), temperature source Rectal, resp. rate (!) 9, height 5\' 6"  (1.676 m), weight 45.4 kg, SpO2 98 %.Body mass index is 16.14 kg/m.  General Appearance: Disheveled  Eye Contact:  Fair  Speech:  Slow  Volume:  Normal  Mood:  Depressed  Affect:  Congruent  Thought Process:  Coherent  Orientation:  Full (Time, Place, and Person)  Thought Content:  Logical  Suicidal Thoughts:  Yes.  without intent/plan  Homicidal Thoughts:  No  Memory:  Recent;   Fair  Judgement:  Impaired  Insight:  Present  Psychomotor Activity:  Normal  Concentration:  Concentration: Poor  Recall:  Poor  Fund of Knowledge:  Poor  Language:  Poor  Akathisia:  No  Handed:  Right  AIMS (if indicated):     Assets:  Resilience  ADL's:  Intact  Cognition:  WNL  Sleep:  Treatment Plan Summary: This is a 44 year old male with a past history of depression who presents with self neglect and suicidal ideation.  Patient will benefit from inpatient hospitalization for safety, stabilization, and medication management.  Disposition: Recommend psychiatric Inpatient admission when medically cleared.  Dixie Dials, MD 12/27/2019 6:30 PM

## 2019-12-27 NOTE — ED Notes (Signed)
Verbal from Agbata to give 1L NS fluid bolus. Given via pump.

## 2019-12-28 ENCOUNTER — Inpatient Hospital Stay: Payer: Self-pay

## 2019-12-28 DIAGNOSIS — E1010 Type 1 diabetes mellitus with ketoacidosis without coma: Secondary | ICD-10-CM

## 2019-12-28 DIAGNOSIS — E46 Unspecified protein-calorie malnutrition: Secondary | ICD-10-CM

## 2019-12-28 LAB — BASIC METABOLIC PANEL
Anion gap: 10 (ref 5–15)
Anion gap: 8 (ref 5–15)
BUN: 49 mg/dL — ABNORMAL HIGH (ref 6–20)
BUN: 45 mg/dL — ABNORMAL HIGH (ref 6–20)
CO2: 21 mmol/L — ABNORMAL LOW (ref 22–32)
CO2: 19 mmol/L — ABNORMAL LOW (ref 22–32)
Calcium: 8.1 mg/dL — ABNORMAL LOW (ref 8.9–10.3)
Calcium: 7.8 mg/dL — ABNORMAL LOW (ref 8.9–10.3)
Chloride: 104 mmol/L (ref 98–111)
Chloride: 106 mmol/L (ref 98–111)
Creatinine, Ser: 1.91 mg/dL — ABNORMAL HIGH (ref 0.61–1.24)
Creatinine, Ser: 1.66 mg/dL — ABNORMAL HIGH (ref 0.61–1.24)
GFR calc Af Amer: 48 mL/min — ABNORMAL LOW (ref >60)
GFR calc Af Amer: 57 mL/min — ABNORMAL LOW (ref >60)
GFR calc non Af Amer: 42 mL/min — ABNORMAL LOW (ref >60)
GFR calc non Af Amer: 49 mL/min — ABNORMAL LOW (ref >60)
Glucose, Bld: 208 mg/dL — ABNORMAL HIGH (ref 70–99)
Glucose, Bld: 142 mg/dL — ABNORMAL HIGH (ref 70–99)
Potassium: 3.4 mmol/L — ABNORMAL LOW (ref 3.5–5.1)
Potassium: 3.3 mmol/L — ABNORMAL LOW (ref 3.5–5.1)
Sodium: 135 mmol/L (ref 135–145)
Sodium: 133 mmol/L — ABNORMAL LOW (ref 135–145)

## 2019-12-28 LAB — GLUCOSE, CAPILLARY
Glucose-Capillary: 105 mg/dL — ABNORMAL HIGH (ref 70–99)
Glucose-Capillary: 138 mg/dL — ABNORMAL HIGH (ref 70–99)
Glucose-Capillary: 161 mg/dL — ABNORMAL HIGH (ref 70–99)
Glucose-Capillary: 206 mg/dL — ABNORMAL HIGH (ref 70–99)
Glucose-Capillary: 212 mg/dL — ABNORMAL HIGH (ref 70–99)
Glucose-Capillary: 263 mg/dL — ABNORMAL HIGH (ref 70–99)
Glucose-Capillary: 49 mg/dL — ABNORMAL LOW (ref 70–99)
Glucose-Capillary: 51 mg/dL — ABNORMAL LOW (ref 70–99)

## 2019-12-28 LAB — COMPREHENSIVE METABOLIC PANEL
ALT: 7 U/L (ref 0–44)
AST: 11 U/L — ABNORMAL LOW (ref 15–41)
Albumin: 2.7 g/dL — ABNORMAL LOW (ref 3.5–5.0)
Alkaline Phosphatase: 66 U/L (ref 38–126)
Anion gap: 5 (ref 5–15)
BUN: 37 mg/dL — ABNORMAL HIGH (ref 6–20)
CO2: 21 mmol/L — ABNORMAL LOW (ref 22–32)
Calcium: 8.1 mg/dL — ABNORMAL LOW (ref 8.9–10.3)
Chloride: 106 mmol/L (ref 98–111)
Creatinine, Ser: 1.46 mg/dL — ABNORMAL HIGH (ref 0.61–1.24)
GFR calc Af Amer: >60 mL/min (ref >60)
GFR calc non Af Amer: 58 mL/min — ABNORMAL LOW (ref >60)
Glucose, Bld: 245 mg/dL — ABNORMAL HIGH (ref 70–99)
Potassium: 3.9 mmol/L (ref 3.5–5.1)
Sodium: 132 mmol/L — ABNORMAL LOW (ref 135–145)
Total Bilirubin: 1.1 mg/dL (ref 0.3–1.2)
Total Protein: 6 g/dL — ABNORMAL LOW (ref 6.5–8.1)

## 2019-12-28 LAB — CBC
HCT: 24.1 % — ABNORMAL LOW (ref 39.0–52.0)
Hemoglobin: 8.1 g/dL — ABNORMAL LOW (ref 13.0–17.0)
MCH: 31 pg (ref 26.0–34.0)
MCHC: 33.6 g/dL (ref 30.0–36.0)
MCV: 92.3 fL (ref 80.0–100.0)
Platelets: 173 10*3/uL (ref 150–400)
RBC: 2.61 MIL/uL — ABNORMAL LOW (ref 4.22–5.81)
RDW: 12.7 % (ref 11.5–15.5)
WBC: 9.1 10*3/uL (ref 4.0–10.5)
nRBC: 0 % (ref 0.0–0.2)

## 2019-12-28 LAB — CBC WITH DIFFERENTIAL/PLATELET
Abs Immature Granulocytes: 0.04 10*3/uL (ref 0.00–0.07)
Basophils Absolute: 0.1 10*3/uL (ref 0.0–0.1)
Basophils Relative: 1 %
Eosinophils Absolute: 0.1 10*3/uL (ref 0.0–0.5)
Eosinophils Relative: 1 %
HCT: 26.8 % — ABNORMAL LOW (ref 39.0–52.0)
Hemoglobin: 8.8 g/dL — ABNORMAL LOW (ref 13.0–17.0)
Immature Granulocytes: 0 %
Lymphocytes Relative: 5 %
Lymphs Abs: 0.6 10*3/uL — ABNORMAL LOW (ref 0.7–4.0)
MCH: 30.7 pg (ref 26.0–34.0)
MCHC: 32.8 g/dL (ref 30.0–36.0)
MCV: 93.4 fL (ref 80.0–100.0)
Monocytes Absolute: 0.3 10*3/uL (ref 0.1–1.0)
Monocytes Relative: 3 %
Neutro Abs: 11.4 10*3/uL — ABNORMAL HIGH (ref 1.7–7.7)
Neutrophils Relative %: 90 %
Platelets: 194 10*3/uL (ref 150–400)
RBC: 2.87 MIL/uL — ABNORMAL LOW (ref 4.22–5.81)
RDW: 12.9 % (ref 11.5–15.5)
WBC: 12.4 10*3/uL — ABNORMAL HIGH (ref 4.0–10.5)
nRBC: 0 % (ref 0.0–0.2)

## 2019-12-28 LAB — C DIFFICILE QUICK SCREEN W PCR REFLEX
C Diff antigen: POSITIVE — AB
C Diff interpretation: DETECTED
C Diff toxin: POSITIVE — AB

## 2019-12-28 LAB — MAGNESIUM
Magnesium: 2 mg/dL (ref 1.7–2.4)
Magnesium: 2 mg/dL (ref 1.7–2.4)

## 2019-12-28 LAB — TROPONIN I (HIGH SENSITIVITY): Troponin I (High Sensitivity): 9 ng/L (ref <18)

## 2019-12-28 LAB — PHOSPHORUS
Phosphorus: 1.8 mg/dL — ABNORMAL LOW (ref 2.5–4.6)
Phosphorus: 2.2 mg/dL — ABNORMAL LOW (ref 2.5–4.6)

## 2019-12-28 LAB — TSH: TSH: 1.051 u[IU]/mL (ref 0.350–4.500)

## 2019-12-28 LAB — BETA-HYDROXYBUTYRIC ACID: Beta-Hydroxybutyric Acid: 0.82 mmol/L — ABNORMAL HIGH (ref 0.05–0.27)

## 2019-12-28 LAB — PREPARE RBC (CROSSMATCH)

## 2019-12-28 LAB — FOLATE: Folate: 7.6 ng/mL (ref >5.9)

## 2019-12-28 LAB — VITAMIN B12: Vitamin B-12: 787 pg/mL (ref 180–914)

## 2019-12-28 IMAGING — MR MR THORACIC SPINE W/O CM
5 of 6 series · 28 of 48 positions shown · non-contrast
Comparison: None.

CLINICAL DATA: Incontinence, bilateral leg pain and weakness

EXAM:
MRI CERVICAL, THORACIC, AND LUMBAR SPINE WITHOUT CONTRAST
TECHNIQUE: Multiplanar and multiecho pulse sequences of the cervical spine, to
include the craniocervical junction and cervicothoracic junction,
thoracic spine, and lumbar spine, were obtained without intravenous
contrast.

[Series 18: T1 · sagittal · 6.0mm · 1.88mm/px · 2 of 9 slices shown (1 of 2)]
[im 1/9]
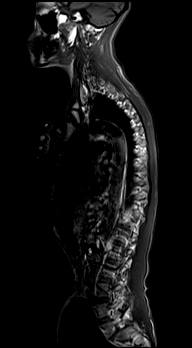
[im 9/9]
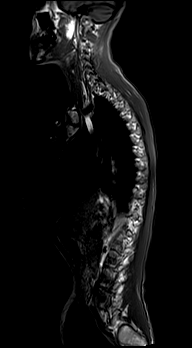

[Series 19: T2 · sagittal · 3.0mm · 1.06mm/px · 6 of 17 slices shown (1 of 2)]
[im 1/17]
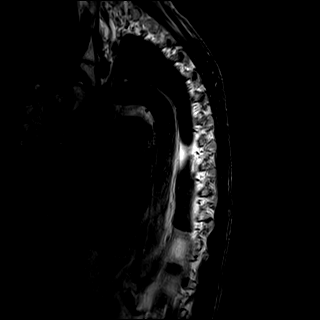
[im 4/17]
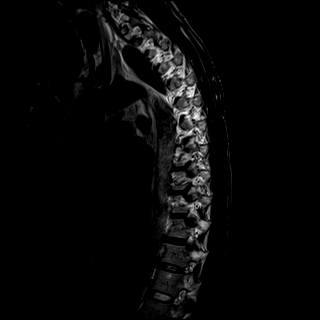
[im 7/17]
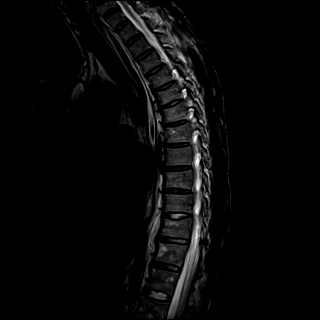
[im 10/17]
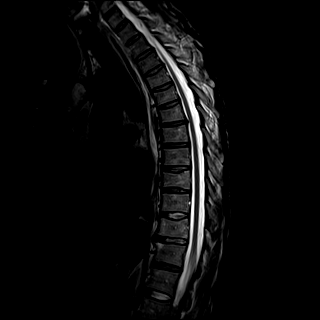
[im 13/17]
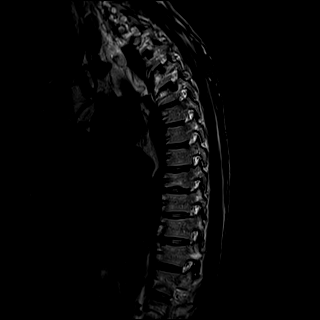
[im 17/17]
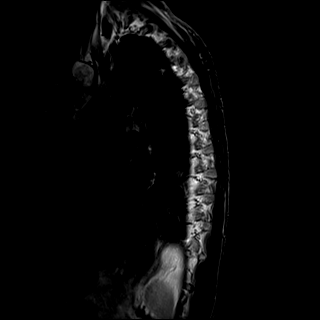

[Series 20: T1 · sagittal · 3.0mm · 1.06mm/px · 6 of 17 slices shown (2 of 2)]
[im 1/17]
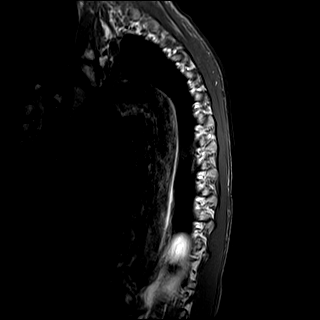
[im 4/17]
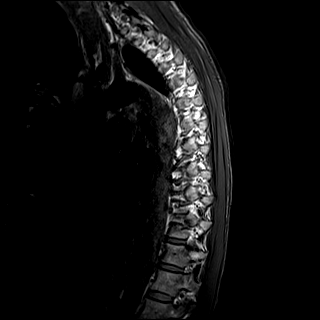
[im 7/17]
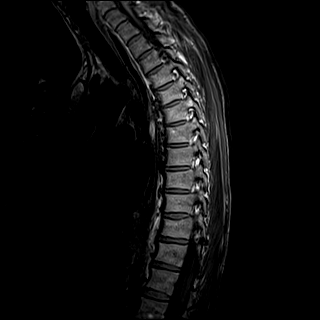
[im 10/17]
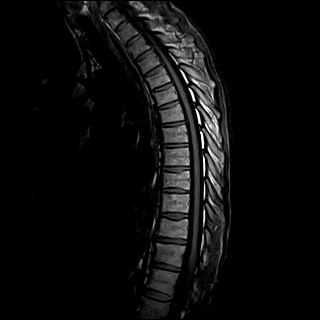
[im 13/17]
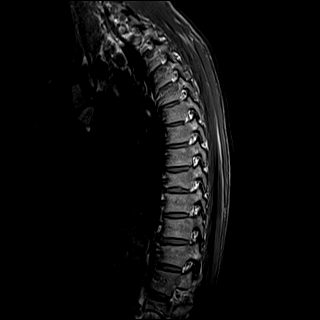
[im 17/17]
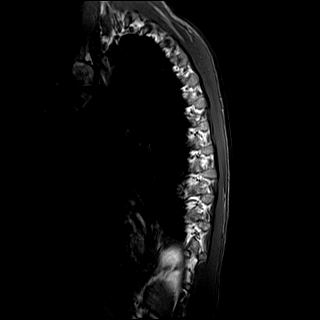

[Series 21: STIR · sagittal · 3.0mm · 0.53mm/px · 6 of 17 slices shown]
[im 1/17]
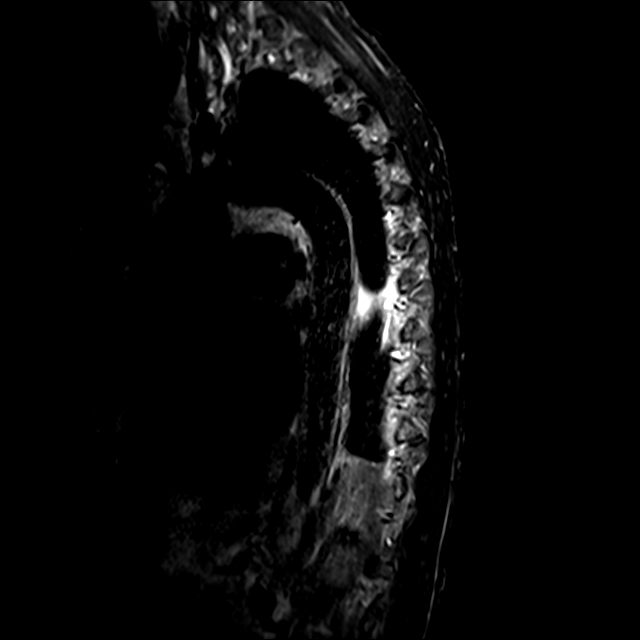
[im 4/17]
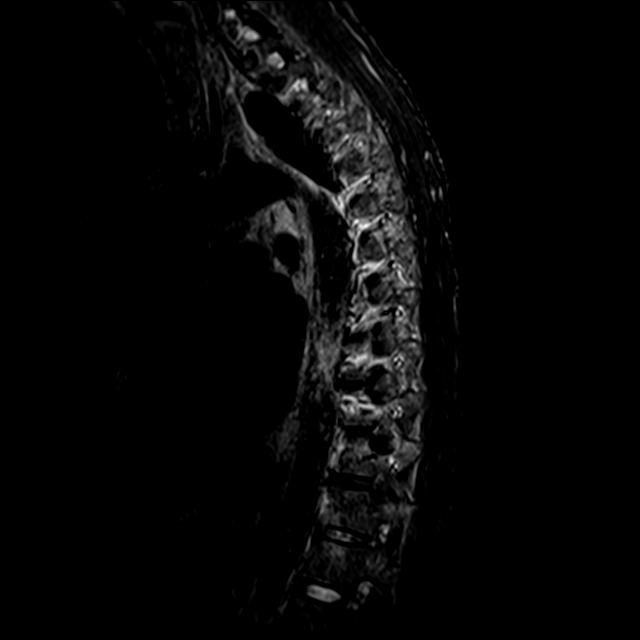
[im 7/17]
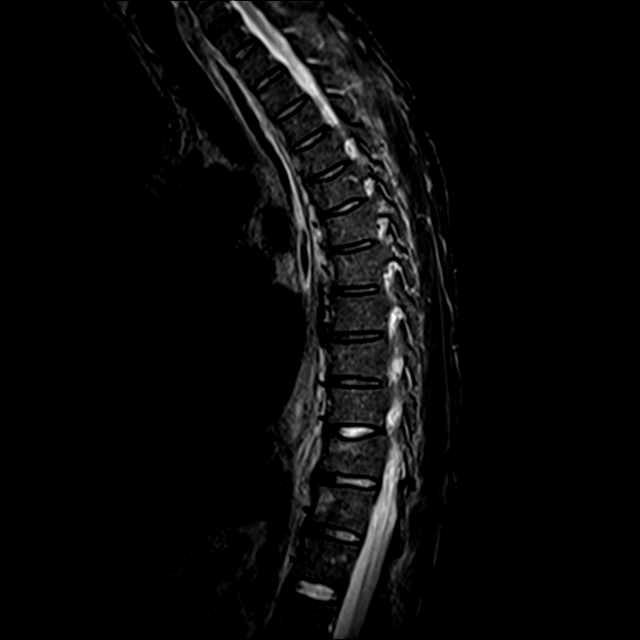
[im 10/17]
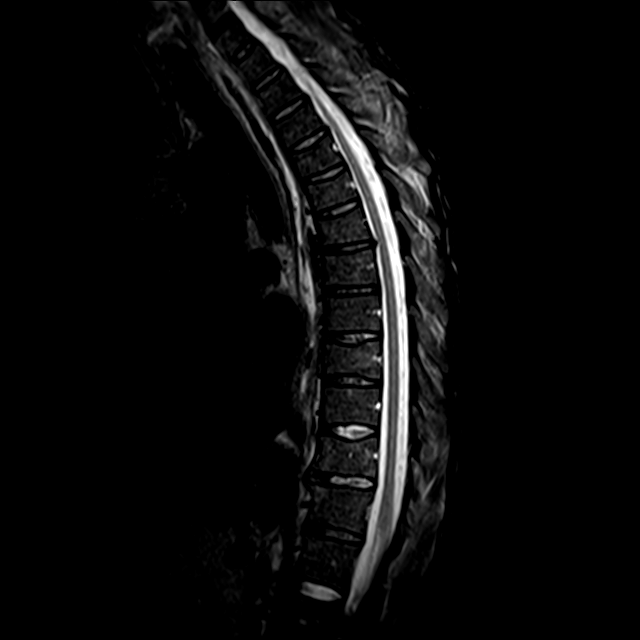
[im 13/17]
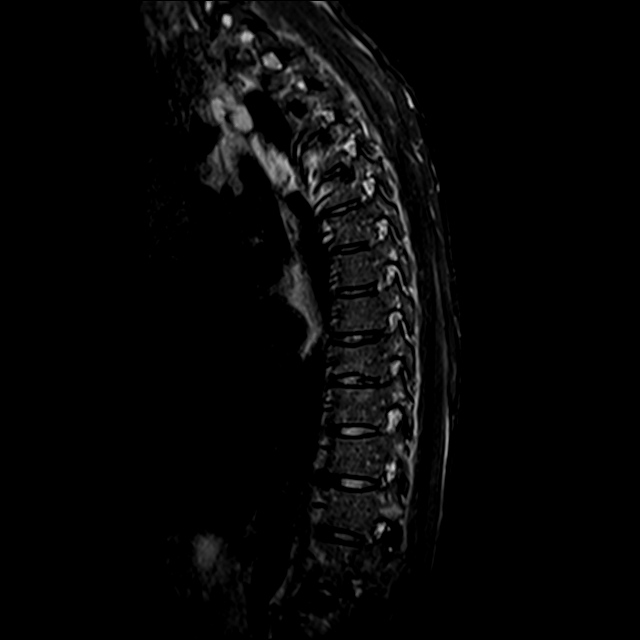
[im 17/17]
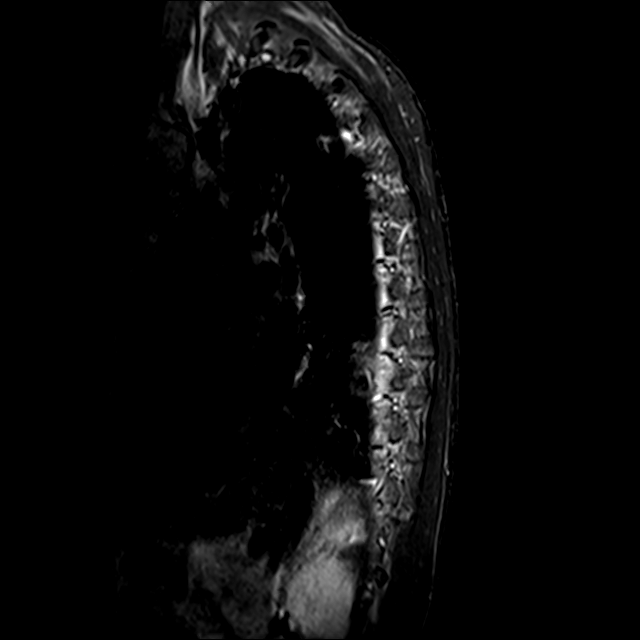

[Series 22: T2 · axial · 4.0mm · 0.59mm/px · z∈[-311,-115]mm · 8 of 39 slices shown (2 of 2)]
[im 1/39]
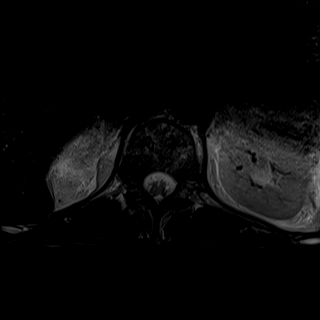
[im 6/39]
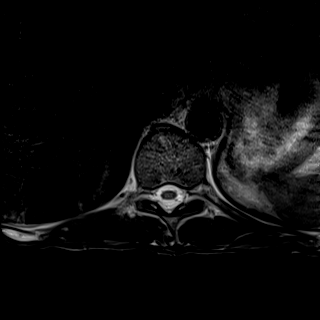
[im 12/39]
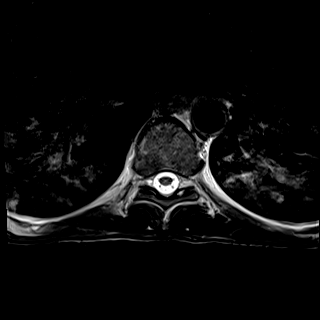
[im 18/39]
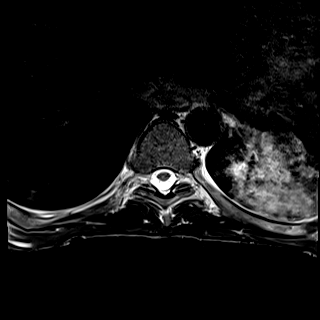
[im 21/39]
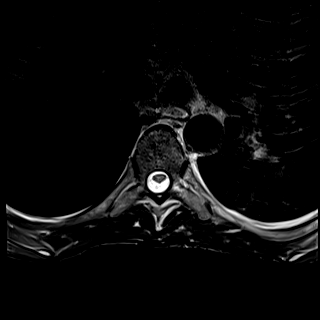
[im 27/39]
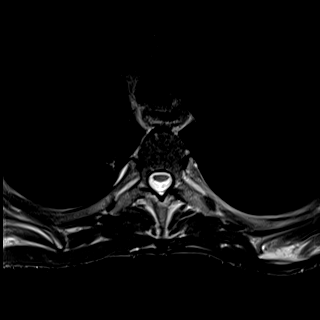
[im 33/39]
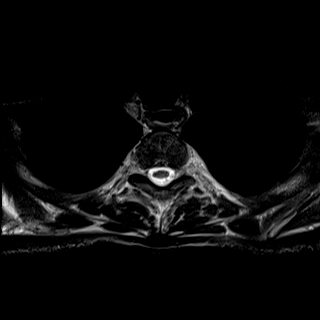
[im 39/39]
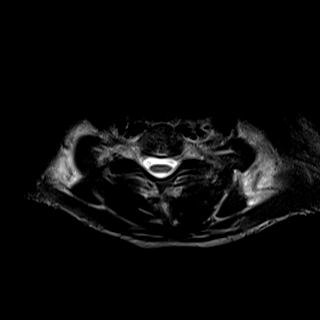

[28 of 48 positions shown; findings below may reference images not displayed]

FINDINGS: MRI CERVICAL SPINE

Alignment: Anteroposterior alignment is maintained.

Vertebrae: Vertebral body heights are maintained. No marrow edema or
suspicious osseous lesion.

Cord: No abnormal signal.

Posterior Fossa, vertebral arteries, paraspinal tissues:
Unremarkable.

Disc levels:

C2-C3:  Facet hypertrophy.  No canal or foraminal stenosis.

C3-C4: Disc bulge and facet hypertrophy. No canal or foraminal
stenosis.

C4-C5: Disc bulge and facet hypertrophy. No canal or foraminal
stenosis.

C5-C6: Disc bulge and facet and uncovertebral hypertrophy. No canal
or right foraminal stenosis. Mild left foraminal stenosis.

C6-C7:  Central disc protrusion.  No canal or foraminal stenosis.

C7-T1:  No canal or foraminal stenosis.

MRI THORACIC SPINE

Alignment: Preserved kyphosis. Anteroposterior alignment is
maintained.

Vertebrae: Minor loss of height for example at T4-T6, T9, and T11.
No suspicious osseous lesion.

Cord: No abnormal signal.

Posterior Fossa, vertebral arteries, paraspinal tissues: Left
greater than right lung base atelectasis/consolidation. Question of
a left lower lobe nodular opacity (series 22, image 15).

Disc levels:

Mild degenerative disc disease. For example, there are tiny
protrusions at T2-T3, T3-T4, and T6-T7. No canal or foraminal
stenosis.

MRI LUMBAR SPINE

Segmentation: Counting from above, there is transitional anatomy at
the lumbosacral junction with 6 lumbar type vertebral bodies

Alignment:  Anteroposterior alignment is maintained.

Vertebrae: Vertebral body heights are preserved. Trace degenerate
endplate marrow edema at L4. No suspicious osseous lesion.

Conus medullaris and cauda equina: Conus extends to the L1-L2 level.
Conus and cauda equina appear normal.

Paraspinal and other soft tissues: Distended bladder

Disc levels: Disc desiccation at L5-L6. Small central protrusion and
annular fissure and mild facet arthropathy. Disc desiccation at
L6-S1. Left foraminal protrusion and annular fissure with endplate
ridging. Facet arthropathy. No canal or right foraminal stenosis.
Moderate left foraminal stenosis with disc/osteophyte contacting the
exiting nerve root.
IMPRESSION: No abnormal cord signal. No cord compression or significant canal
stenosis.

Transitional anatomy at the lumbosacral junction with 6 lumbar type
vertebral bodies. Left foraminal stenosis at L6-S1.

Distended bladder.

## 2019-12-28 IMAGING — MR MR CERVICAL SPINE W/O CM
5 series · 36 of 48 positions shown · non-contrast
Comparison: None.

CLINICAL DATA: Incontinence, bilateral leg pain and weakness

EXAM:
MRI CERVICAL, THORACIC, AND LUMBAR SPINE WITHOUT CONTRAST
TECHNIQUE: Multiplanar and multiecho pulse sequences of the cervical spine, to
include the craniocervical junction and cervicothoracic junction,
thoracic spine, and lumbar spine, were obtained without intravenous
contrast.

[Series 16: T2 · sagittal · 3.0mm · 0.62mm/px · 6 of 15 slices shown (1 of 2)]
[im 1/15]
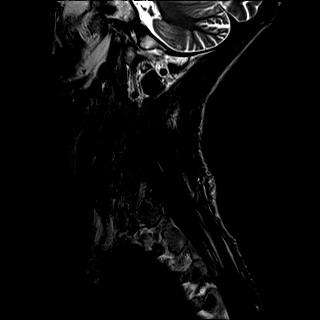
[im 3/15]
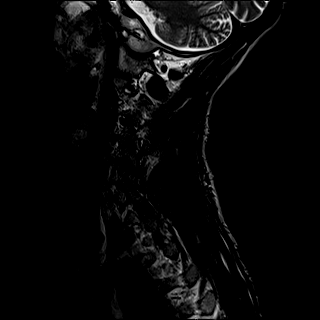
[im 6/15]
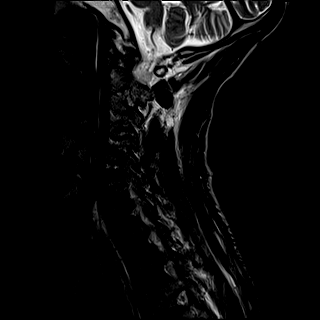
[im 9/15]
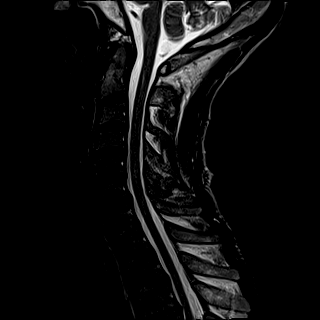
[im 12/15]
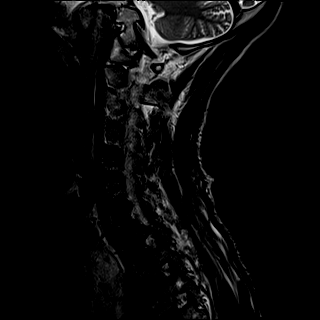
[im 15/15]
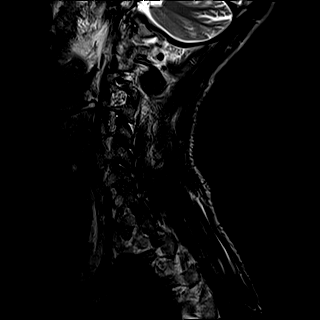

[Series 17: FLAIR · sagittal · 3.0mm · 0.78mm/px · 6 of 15 slices shown]
[im 1/15]
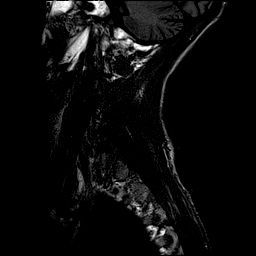
[im 3/15]
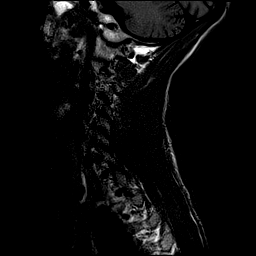
[im 6/15]
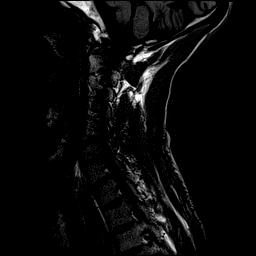
[im 9/15]
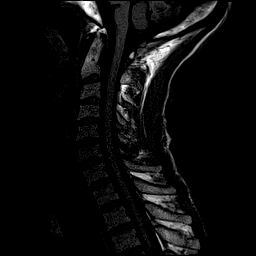
[im 12/15]
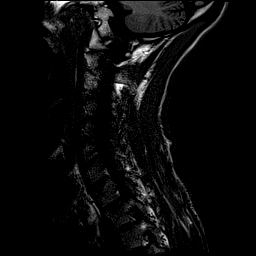
[im 15/15]
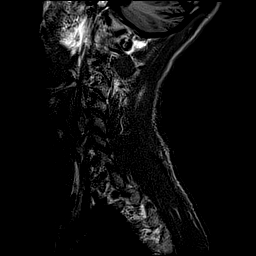

[Series 18: STIR · sagittal · 3.0mm · 0.62mm/px · 6 of 15 slices shown]
[im 1/15]
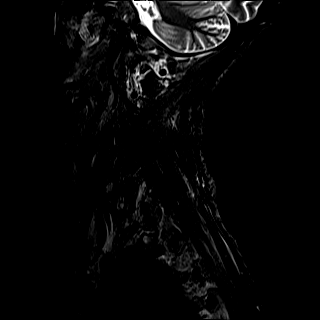
[im 3/15]
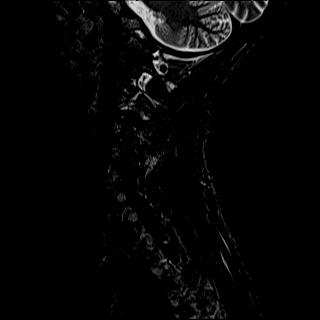
[im 6/15]
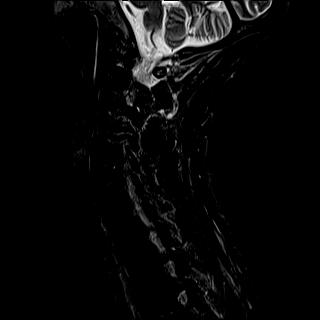
[im 9/15]
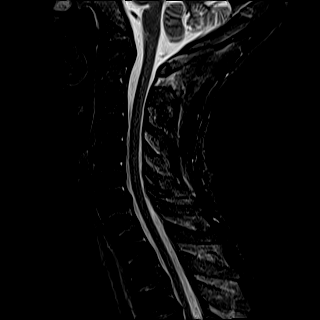
[im 12/15]
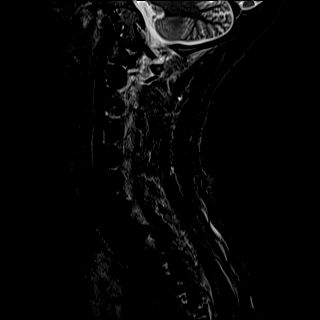
[im 15/15]
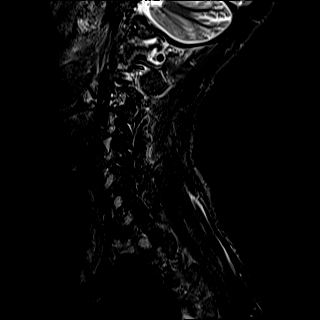

[Series 19: T2 · axial · 3.0mm · 0.70mm/px · z∈[-96,+29]mm · 10 of 38 slices shown (2 of 2)]
[im 1/38]
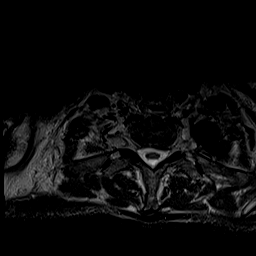
[im 3/38]
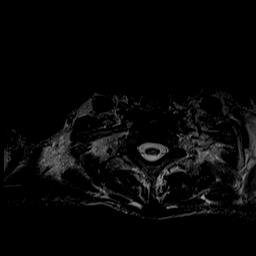
[im 6/38]
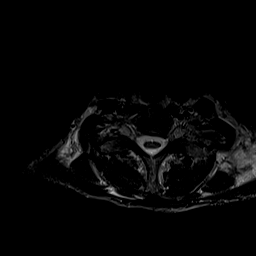
[im 11/38]
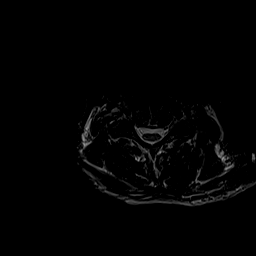
[im 16/38]
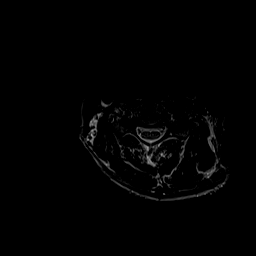
[im 19/38]
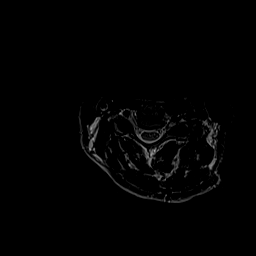
[im 22/38]
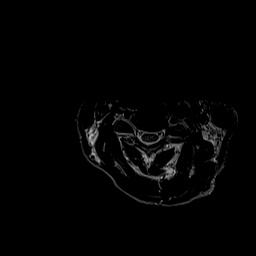
[im 27/38]
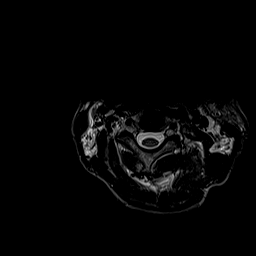
[im 32/38]
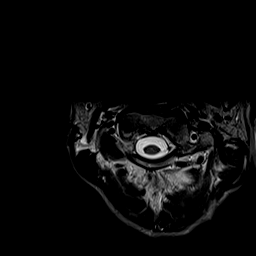
[im 38/38]
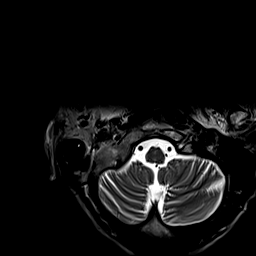

[Series 20: ax mpgr · axial · 3.0mm · 0.35mm/px · z∈[-96,+29]mm · 8 of 38 slices shown]
[im 1/38]
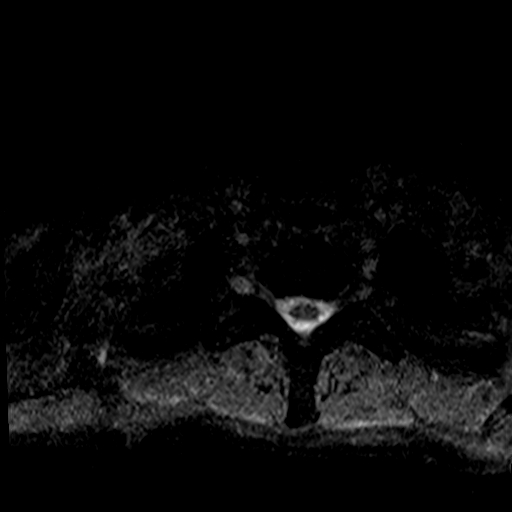
[im 6/38]
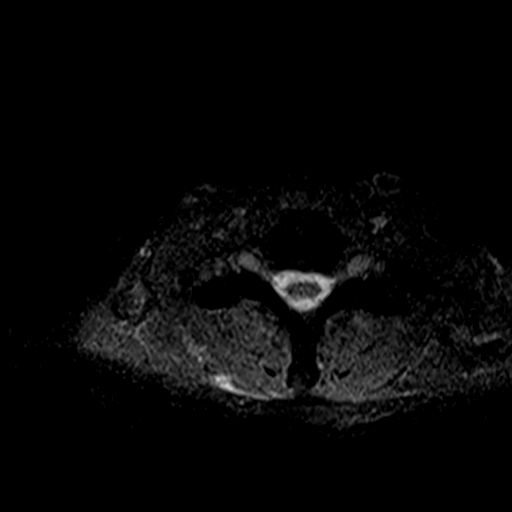
[im 11/38]
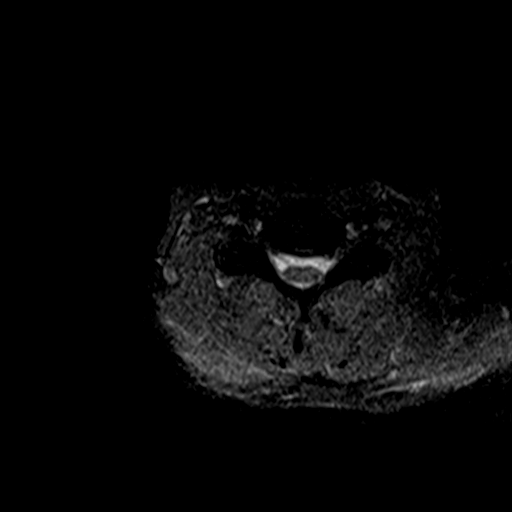
[im 16/38]
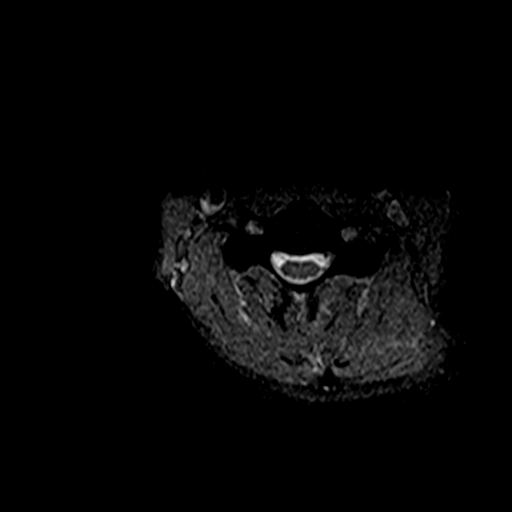
[im 22/38]
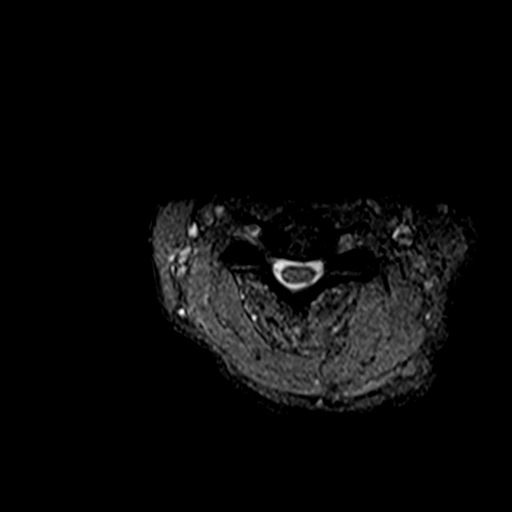
[im 27/38]
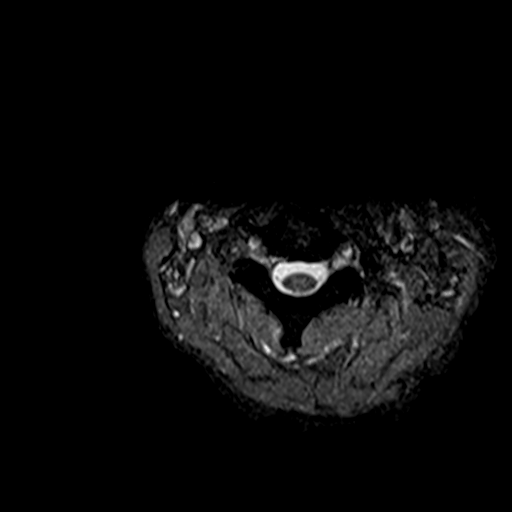
[im 32/38]
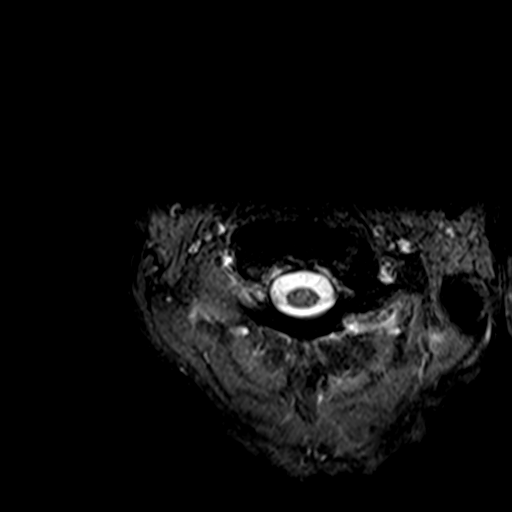
[im 38/38]
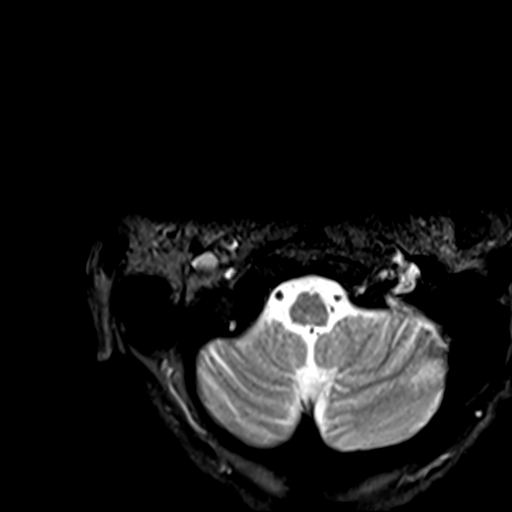

[36 of 48 positions shown; findings below may reference images not displayed]

FINDINGS: MRI CERVICAL SPINE

Alignment: Anteroposterior alignment is maintained.

Vertebrae: Vertebral body heights are maintained. No marrow edema or
suspicious osseous lesion.

Cord: No abnormal signal.

Posterior Fossa, vertebral arteries, paraspinal tissues:
Unremarkable.

Disc levels:

C2-C3:  Facet hypertrophy.  No canal or foraminal stenosis.

C3-C4: Disc bulge and facet hypertrophy. No canal or foraminal
stenosis.

C4-C5: Disc bulge and facet hypertrophy. No canal or foraminal
stenosis.

C5-C6: Disc bulge and facet and uncovertebral hypertrophy. No canal
or right foraminal stenosis. Mild left foraminal stenosis.

C6-C7:  Central disc protrusion.  No canal or foraminal stenosis.

C7-T1:  No canal or foraminal stenosis.

MRI THORACIC SPINE

Alignment: Preserved kyphosis. Anteroposterior alignment is
maintained.

Vertebrae: Minor loss of height for example at T4-T6, T9, and T11.
No suspicious osseous lesion.

Cord: No abnormal signal.

Posterior Fossa, vertebral arteries, paraspinal tissues: Left
greater than right lung base atelectasis/consolidation. Question of
a left lower lobe nodular opacity (series 22, image 15).

Disc levels:

Mild degenerative disc disease. For example, there are tiny
protrusions at T2-T3, T3-T4, and T6-T7. No canal or foraminal
stenosis.

MRI LUMBAR SPINE

Segmentation: Counting from above, there is transitional anatomy at
the lumbosacral junction with 6 lumbar type vertebral bodies

Alignment:  Anteroposterior alignment is maintained.

Vertebrae: Vertebral body heights are preserved. Trace degenerate
endplate marrow edema at L4. No suspicious osseous lesion.

Conus medullaris and cauda equina: Conus extends to the L1-L2 level.
Conus and cauda equina appear normal.

Paraspinal and other soft tissues: Distended bladder

Disc levels: Disc desiccation at L5-L6. Small central protrusion and
annular fissure and mild facet arthropathy. Disc desiccation at
L6-S1. Left foraminal protrusion and annular fissure with endplate
ridging. Facet arthropathy. No canal or right foraminal stenosis.
Moderate left foraminal stenosis with disc/osteophyte contacting the
exiting nerve root.
IMPRESSION: No abnormal cord signal. No cord compression or significant canal
stenosis.

Transitional anatomy at the lumbosacral junction with 6 lumbar type
vertebral bodies. Left foraminal stenosis at L6-S1.

Distended bladder.

## 2019-12-28 IMAGING — MR MR LUMBAR SPINE W/O CM
4 of 5 series · 30 of 48 positions shown · non-contrast
Comparison: None.

CLINICAL DATA: Incontinence, bilateral leg pain and weakness

EXAM:
MRI CERVICAL, THORACIC, AND LUMBAR SPINE WITHOUT CONTRAST
TECHNIQUE: Multiplanar and multiecho pulse sequences of the cervical spine, to
include the craniocervical junction and cervicothoracic junction,
thoracic spine, and lumbar spine, were obtained without intravenous
contrast.

[Series 21: T2 · sagittal · 4.0mm · 0.81mm/px · 6 of 17 slices shown (1 of 2)]
[im 1/17]
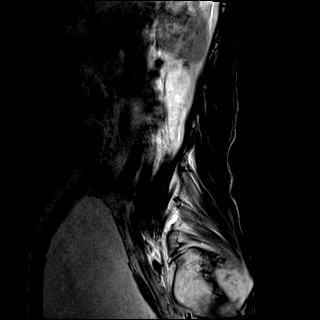
[im 4/17]
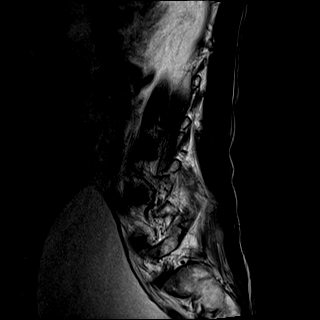
[im 7/17]
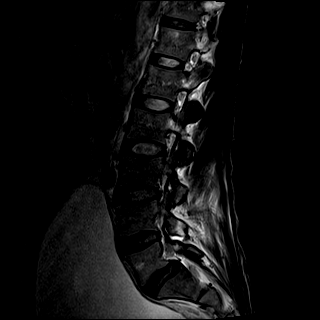
[im 10/17]
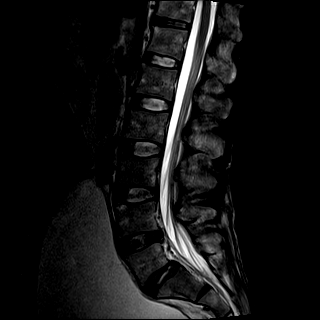
[im 13/17]
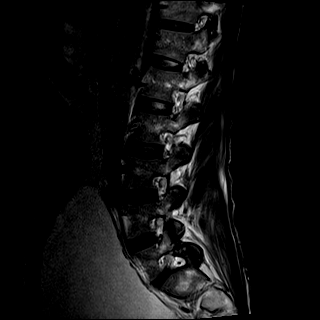
[im 17/17]
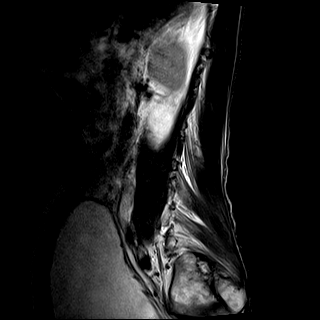

[Series 24: T1 · sagittal · 4.0mm · 0.81mm/px · 6 of 17 slices shown (1 of 2)]
[im 1/17]
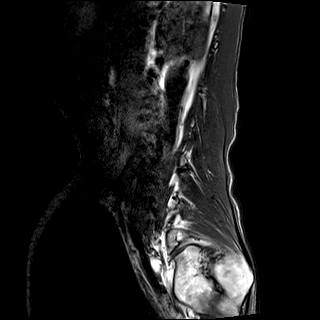
[im 4/17]
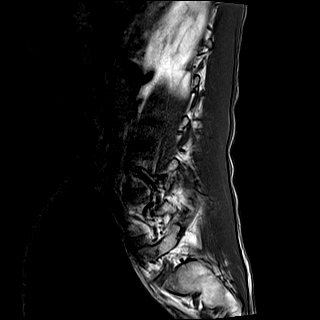
[im 7/17]
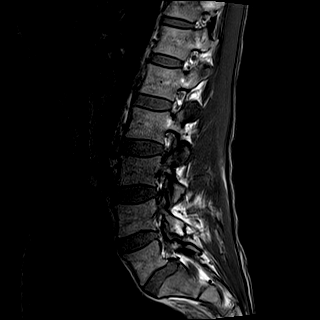
[im 10/17]
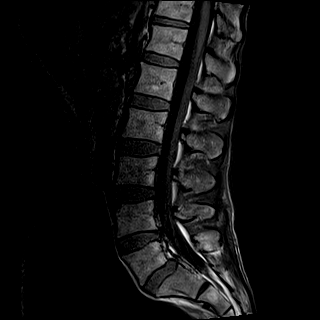
[im 13/17]
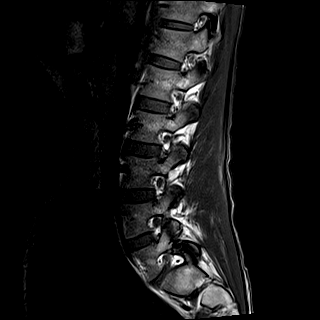
[im 17/17]
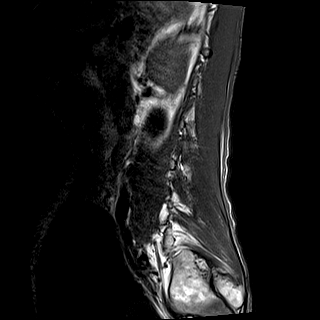

[Series 25: T2 · axial · 4.0mm · 0.78mm/px · z∈[-562,-313]mm · 9 of 43 slices shown (2 of 2)]
[im 1/43]
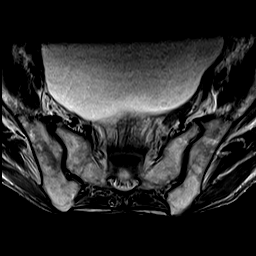
[im 7/43]
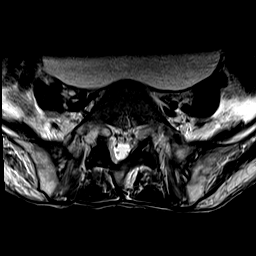
[im 13/43]
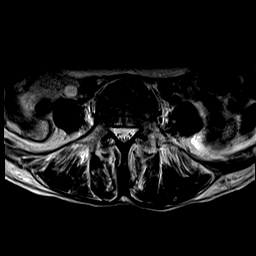
[im 19/43]
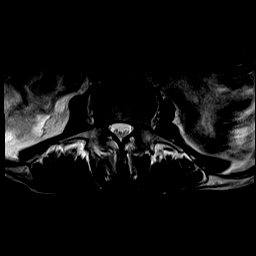
[im 22/43]
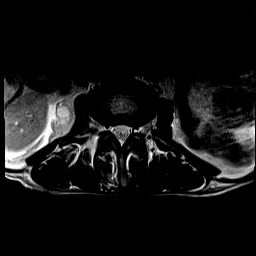
[im 25/43]
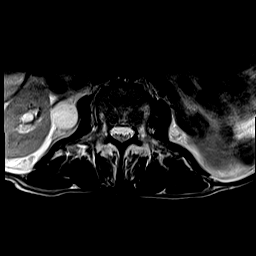
[im 31/43]
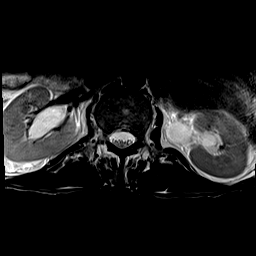
[im 37/43]
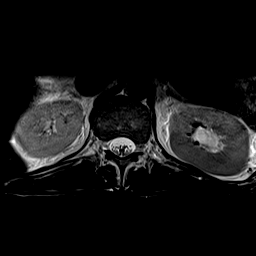
[im 43/43]
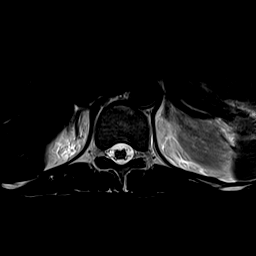

[Series 26: T1 · axial · 4.0mm · 0.39mm/px · z∈[-562,-313]mm · 9 of 43 slices shown (2 of 2)]
[im 1/43]
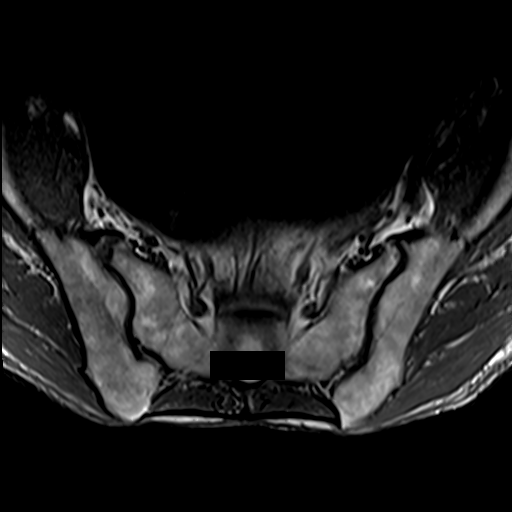
[im 7/43]
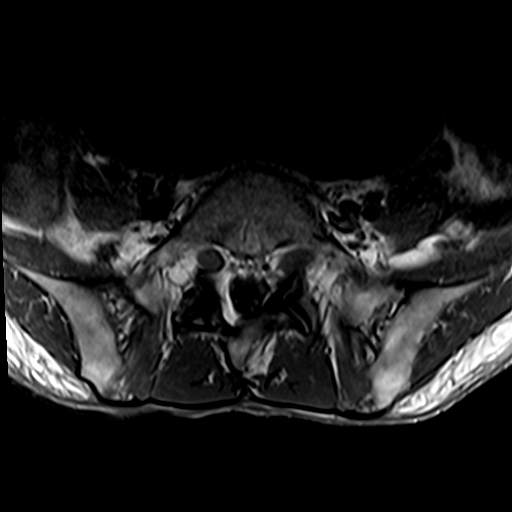
[im 13/43]
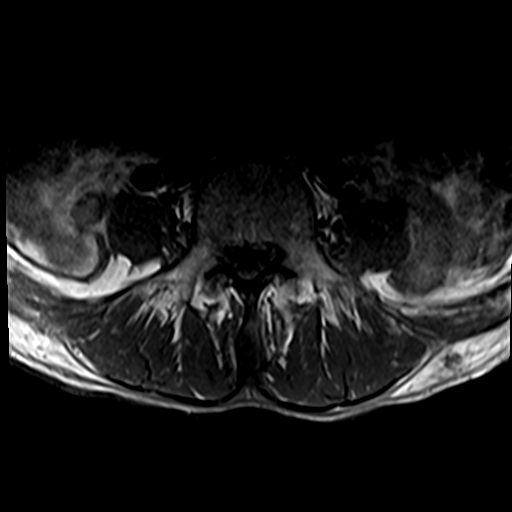
[im 19/43]
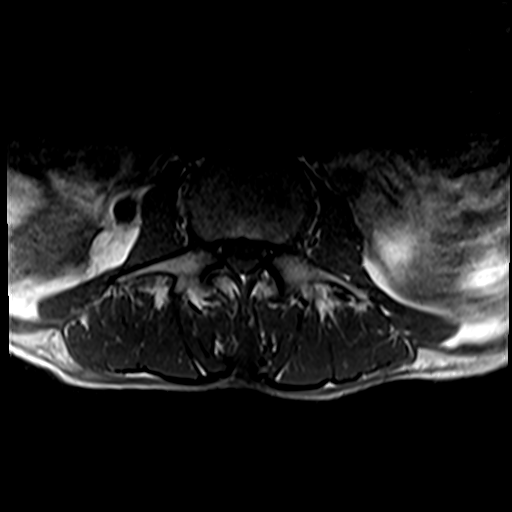
[im 22/43]
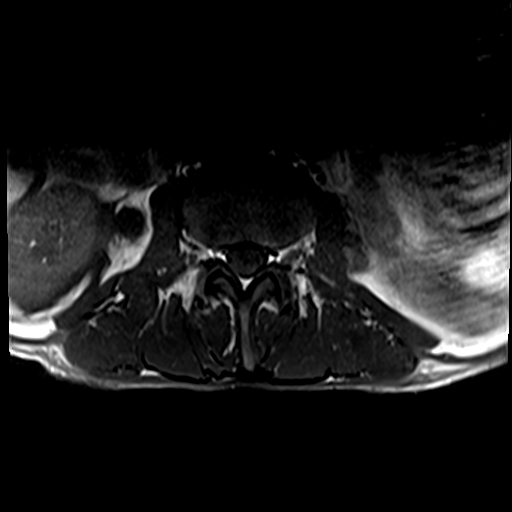
[im 25/43]
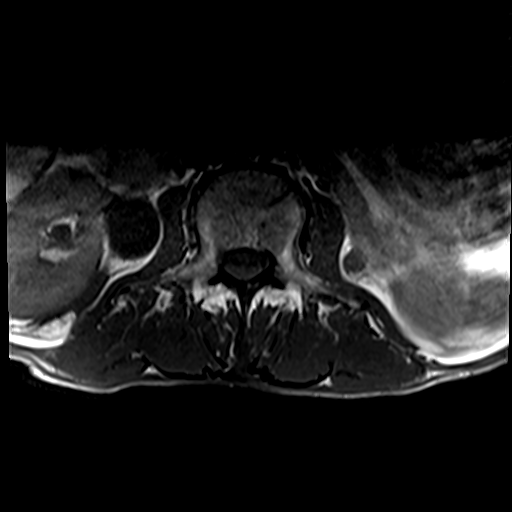
[im 31/43]
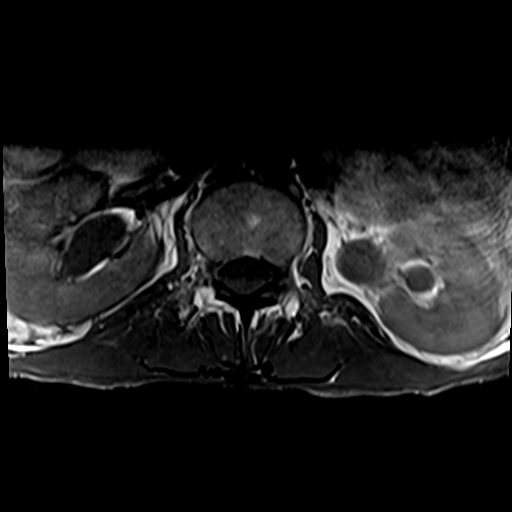
[im 37/43]
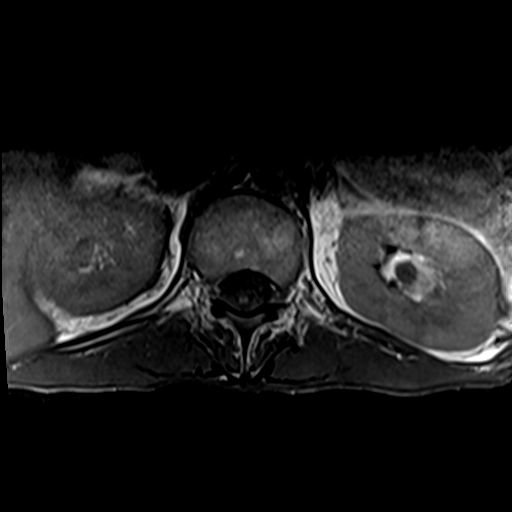
[im 43/43]
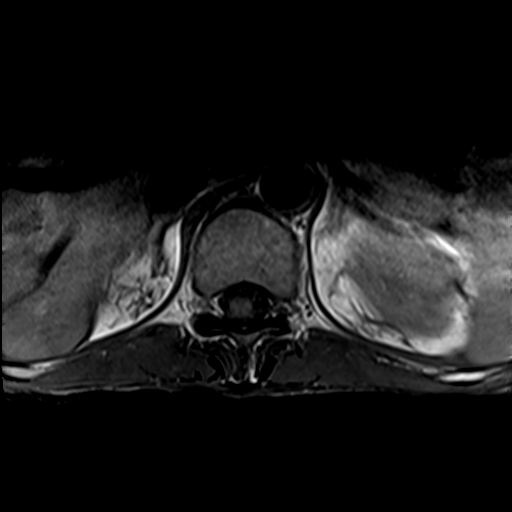

[30 of 48 positions shown; findings below may reference images not displayed]

FINDINGS: MRI CERVICAL SPINE

Alignment: Anteroposterior alignment is maintained.

Vertebrae: Vertebral body heights are maintained. No marrow edema or
suspicious osseous lesion.

Cord: No abnormal signal.

Posterior Fossa, vertebral arteries, paraspinal tissues:
Unremarkable.

Disc levels:

C2-C3:  Facet hypertrophy.  No canal or foraminal stenosis.

C3-C4: Disc bulge and facet hypertrophy. No canal or foraminal
stenosis.

C4-C5: Disc bulge and facet hypertrophy. No canal or foraminal
stenosis.

C5-C6: Disc bulge and facet and uncovertebral hypertrophy. No canal
or right foraminal stenosis. Mild left foraminal stenosis.

C6-C7:  Central disc protrusion.  No canal or foraminal stenosis.

C7-T1:  No canal or foraminal stenosis.

MRI THORACIC SPINE

Alignment: Preserved kyphosis. Anteroposterior alignment is
maintained.

Vertebrae: Minor loss of height for example at T4-T6, T9, and T11.
No suspicious osseous lesion.

Cord: No abnormal signal.

Posterior Fossa, vertebral arteries, paraspinal tissues: Left
greater than right lung base atelectasis/consolidation. Question of
a left lower lobe nodular opacity (series 22, image 15).

Disc levels:

Mild degenerative disc disease. For example, there are tiny
protrusions at T2-T3, T3-T4, and T6-T7. No canal or foraminal
stenosis.

MRI LUMBAR SPINE

Segmentation: Counting from above, there is transitional anatomy at
the lumbosacral junction with 6 lumbar type vertebral bodies

Alignment:  Anteroposterior alignment is maintained.

Vertebrae: Vertebral body heights are preserved. Trace degenerate
endplate marrow edema at L4. No suspicious osseous lesion.

Conus medullaris and cauda equina: Conus extends to the L1-L2 level.
Conus and cauda equina appear normal.

Paraspinal and other soft tissues: Distended bladder

Disc levels: Disc desiccation at L5-L6. Small central protrusion and
annular fissure and mild facet arthropathy. Disc desiccation at
L6-S1. Left foraminal protrusion and annular fissure with endplate
ridging. Facet arthropathy. No canal or right foraminal stenosis.
Moderate left foraminal stenosis with disc/osteophyte contacting the
exiting nerve root.
IMPRESSION: No abnormal cord signal. No cord compression or significant canal
stenosis.

Transitional anatomy at the lumbosacral junction with 6 lumbar type
vertebral bodies. Left foraminal stenosis at L6-S1.

Distended bladder.

## 2019-12-28 MED ORDER — VANCOMYCIN 50 MG/ML ORAL SOLUTION
125.0000 mg | Freq: Four times a day (QID) | ORAL | Status: DC
  Administered 2019-12-28 – 2020-01-03 (×23): 125 mg via ORAL
  Filled 2019-12-28 (×30): qty 2.5

## 2019-12-28 MED ORDER — POTASSIUM PHOSPHATES 15 MMOLE/5ML IV SOLN
30.0000 mmol | Freq: Once | INTRAVENOUS | Status: AC
  Administered 2019-12-28: 30 mmol via INTRAVENOUS
  Filled 2019-12-28: qty 10

## 2019-12-28 MED ORDER — SODIUM CHLORIDE 0.9 % IV BOLUS
500.0000 mL | Freq: Once | INTRAVENOUS | Status: AC
  Administered 2019-12-28: 500 mL via INTRAVENOUS

## 2019-12-28 MED ORDER — MAGIC MOUTHWASH W/LIDOCAINE
5.0000 mL | Freq: Three times a day (TID) | ORAL | Status: DC | PRN
  Filled 2019-12-28: qty 5

## 2019-12-28 MED ORDER — ENSURE MAX PROTEIN PO LIQD
11.0000 [oz_av] | Freq: Two times a day (BID) | ORAL | Status: DC
  Administered 2019-12-28 – 2020-01-03 (×10): 11 [oz_av] via ORAL
  Filled 2019-12-28: qty 330

## 2019-12-28 MED ORDER — ONDANSETRON HCL 4 MG/2ML IJ SOLN
4.0000 mg | Freq: Four times a day (QID) | INTRAMUSCULAR | Status: DC | PRN
  Administered 2019-12-28: 4 mg via INTRAVENOUS
  Filled 2019-12-28: qty 2

## 2019-12-28 MED ORDER — POTASSIUM CHLORIDE CRYS ER 20 MEQ PO TBCR
40.0000 meq | EXTENDED_RELEASE_TABLET | Freq: Once | ORAL | Status: AC
  Administered 2019-12-28: 40 meq via ORAL
  Filled 2019-12-28: qty 2

## 2019-12-28 MED ORDER — SODIUM CHLORIDE 0.9% IV SOLUTION: Freq: Once | INTRAVENOUS | Status: DC

## 2019-12-28 MED ORDER — ADULT MULTIVITAMIN W/MINERALS CH
1.0000 | ORAL_TABLET | Freq: Every day | ORAL | Status: DC
  Administered 2019-12-29 – 2020-01-03 (×6): 1 via ORAL
  Filled 2019-12-28 (×6): qty 1

## 2019-12-28 NOTE — Plan of Care (Signed)
Pt positive for C.diff.  Pt on enteric precautions. 2x loose stools since transferred to 1A.  Denies n/v or abdominal pain. Pt able to turn himself in the bed, but needs to be encouraged to do so. Pt did not eat dinner., but had Ensure and apple juice.  Upon transfer abdomen was distended. Bladder scan showed >1635ml.  Urinal offered to pt, pt voided without difficulty 1390ml.  Abdomen soft after pt voided.

## 2019-12-28 NOTE — Consult Note (Signed)
Reason for Consult:Incontinence Requesting Physician: Posey Pronto  CC: Weakness  I have been asked by Dr. Posey Pronto to see this patient in consultation for incontinence.  HPI: Gregory Moody is an 44 y.o. male with a history of DM and CKD who is a poor historian.  History obtained from patient and augmented from chart.  Per EMS, police were at the patient's home to serve a warrant on another resident.  When they noted the condition the patient was living in, APS was contacted and they recommended the patient be brought to the ER for further evaluation.  Patient states that he has not had anything to eat for at least 5 days and complained of nausea and vomiting, difficulty getting food down.  Although not eating for the past five days it seems that his appetite was poor prior to then as well.  Also reports that for the past 5 days he has had no control over his bladder or bowels.  Is unable to ambulate but it is difficult to tell for how long.  Reports that he stopped work about 2 years ago because of pain and weakness in his legs.  Reports he still has pain as well.  Is not very active and has not been for some time.   Noncompliant with medications.    Past Medical History:  Diagnosis Date  . COVID-19   . Diabetes mellitus without complication ([place])   . Gastroparesis   . Tuberculosis     Past Surgical History:  Procedure Laterality Date  . ESOPHAGOGASTRODUODENOSCOPY N/A 02/03/2019   Procedure: ESOPHAGOGASTRODUODENOSCOPY (EGD);  Surgeon: Toledo, Benay Pike, MD;  Location: [facility] ENDOSCOPY;  Service: Gastroenterology;  Laterality: N/A;    Family History  Problem Relation Age of Onset  . Diabetes Mother   . Diabetes Father     Social History:  reports that he has been smoking. He has never used smokeless tobacco. He reports that he does not drink alcohol or use drugs.  No Known Allergies  Medications:  I have reviewed the patient's current medications. Prior to Admission:  Medications  Prior to Admission  Medication Sig Dispense Refill Last Dose  . insulin NPH-regular Human (70-30) 100 UNIT/ML injection Inject 20 Units into the skin 2 (two) times daily with a meal. 25 units with breakfast in am and 35 units with supper 10 mL 11 Past Month at Unknown time  . aspirin EC 81 MG tablet Take 81 mg by mouth daily.   Not Taking at Unknown time  . loperamide (IMODIUM A-D) 2 MG tablet Take 1 tablet (2 mg total) by mouth 4 (four) times daily as needed for diarrhea or loose stools. (Patient not taking: Reported on 12/27/2019) 20 tablet 0 Not Taking at Unknown time  . naproxen (NAPROSYN) 500 MG tablet Take 1 tablet (500 mg total) by mouth 2 (two) times daily with a meal. (Patient not taking: Reported on 12/27/2019) 20 tablet 2 Not Taking at Unknown time  . ondansetron (ZOFRAN) 4 MG tablet Take 1 tablet (4 mg total) by mouth every 6 (six) hours as needed for nausea. (Patient not taking: Reported on 12/27/2019) 20 tablet 0 Not Taking at Unknown time  . pantoprazole (PROTONIX) 40 MG tablet Take 1 tablet (40 mg total) by mouth 2 (two) times daily before a meal. (Patient not taking: Reported on 12/27/2019) 60 tablet 2 Not Taking at Unknown time   Scheduled: . sodium chloride   Intravenous Once  . aspirin EC  81 mg Oral Daily  . Chlorhexidine Gluconate  Cloth  6 each Topical Daily  . heparin  5,000 Units Subcutaneous Q8H  . insulin aspart  0-5 Units Subcutaneous QHS  . insulin aspart  0-9 Units Subcutaneous TID WC  . insulin aspart  3 Units Subcutaneous TID WC  . insulin glargine  10 Units Subcutaneous QHS  . mouth rinse  15 mL Mouth Rinse BID  . [START ON 12/29/2019] multivitamin with minerals  1 tablet Oral Daily  . potassium chloride  40 mEq Oral Once  . Ensure Max Protein  11 oz Oral BID BM    ROS: History obtained from the patient  General ROS: as noted in HPI Psychological ROS: negative for - behavioral disorder, hallucinations, memory difficulties, mood swings or suicidal  ideation Ophthalmic ROS: negative for - blurry vision, double vision, eye pain or loss of vision ENT ROS: negative for - epistaxis, nasal discharge, oral lesions, sore throat, tinnitus or vertigo Allergy and Immunology ROS: negative for - hives or itchy/watery eyes Hematological and Lymphatic ROS: negative for - bleeding problems, bruising or swollen lymph nodes Endocrine ROS: negative for - galactorrhea, hair pattern changes, polydipsia/polyuria or temperature intolerance Respiratory ROS: negative for - cough, hemoptysis, shortness of breath or wheezing Cardiovascular ROS: negative for - chest pain, dyspnea on exertion, edema or irregular heartbeat Gastrointestinal ROS: as noted in HPIGenito-Urinary ROS: urinary incontinence Musculoskeletal ROS: as noted in HPI Neurological ROS: as noted in HPI Dermatological ROS: Sores on skin  Physical Examination: Blood pressure (!) 87/65, pulse 72, temperature (!) 97.2 F (36.2 C), resp. rate 14, height 5\' 7"  (1.702 m), weight 50.1 kg, SpO2 98 %.  Gen: Cachectic HEENT-  Normocephalic, no lesions, without obvious abnormality.  Normal external eye and conjunctiva.  Normal TM's bilaterally.  Normal auditory canals and external ears. Normal external nose, mucus membranes and septum.  Normal pharynx. Cardiovascular- S1, S2 normal, pulses palpable throughout   Lungs- chest clear, no wheezing, rales, normal symmetric air entry Abdomen- soft, non-tender; bowel sounds normal; no masses,  no organomegaly Extremities- atrophied throughout.  Edema in feet Lymph-no adenopathy palpable Musculoskeletal-no joint tenderness, deformity or swelling Skin-Open sores   Neurological Examination   Mental Status: Alert, oriented, thought content appropriate.  Speech fluent without evidence of aphasia.  Able to follow 3 step commands without difficulty. Cranial Nerves: II: Discs flat bilaterally; Visual fields grossly normal, pupils equal, round, reactive to light and  accommodation III,IV, VI: ptosis not present, extra-ocular motions intact bilaterally V,VII: smile symmetric, facial light touch sensation normal bilaterally VIII: hearing normal bilaterally IX,X: gag reflex present XI: bilateral shoulder shrug XII: midline tongue extension Motor: Generalized weakness noted.  Able to lift all extremities against gravity and maintain.  Approximated 5-/5 weakness in the upper extremities and 4/5 in the lower extremities.  Atrophy noted throughout Sensory: Pinprick and light touch intact throughout, bilaterally Deep Tendon Reflexes: Symmetric throughout Plantars: Right: mute   Left: mute Cerebellar: Normal finger-to-nose and normal heel-to-shin testing bilaterally Gait: not tested due to safety concerns   Laboratory Studies:   Basic Metabolic Panel: Recent Labs  Lab 12/27/19 1036 12/27/19 1036 12/27/19 1559 12/27/19 1559 12/27/19 2049 12/28/19 0159 12/28/19 0534  NA 133*  --  133*  --  134* 135 133*  K 3.9  --  3.5  --  4.6 3.4* 3.3*  CL 93*  --  101  --  104 104 106  CO2 15*  --  19*  --  19* 21* 19*  GLUCOSE 408*  --  204*  --  201* 208* 142*  BUN 68*  --  57*  --  53* 49* 45*  CREATININE 3.15*  --  2.52*  --  2.24* 1.91* 1.66*  CALCIUM 8.5*   < > 8.0*   < > 8.1* 8.1* 7.8*  MG 2.8*  --   --   --   --   --   --   PHOS 5.3*  --   --   --   --   --   --    < > = values in this interval not displayed.    Liver Function Tests: Recent Labs  Lab 12/27/19 1036  AST 12*  ALT 10  ALKPHOS 75  BILITOT 2.0*  PROT 7.0  ALBUMIN 3.2*   Recent Labs  Lab 12/27/19 1036  LIPASE 39   No results for input(s): AMMONIA in the last 168 hours.  CBC: Recent Labs  Lab 12/27/19 1036 12/28/19 0542  WBC 10.9* 9.1  NEUTROABS 9.7*  --   HGB 10.2* 8.1*  HCT 31.5* 24.1*  MCV 94.9 92.3  PLT 245 173    Cardiac Enzymes: No results for input(s): CKTOTAL, CKMB, CKMBINDEX, TROPONINI in the last 168 hours.  BNP: Invalid input(s):  POCBNP  CBG: Recent Labs  Lab 12/27/19 2353 12/28/19 0059 12/28/19 0737 12/28/19 1138 12/28/19 1202  GLUCAP 161* 206* 105* 75* 41*    Microbiology: Results for orders placed or performed during the hospital encounter of 12/27/19  Culture, blood (routine x 2)     Status: None (Preliminary result)   Collection Time: 12/27/19 11:01 AM   Specimen: BLOOD  Result Value Ref Range Status   Specimen Description BLOOD LEFT AC  Final   Special Requests   Final    BOTTLES DRAWN AEROBIC AND ANAEROBIC Blood Culture adequate volume   Culture   Final    NO GROWTH < 24 HOURS Performed at [facility], [address], [place], [place] [address]    Report Status PENDING  Incomplete  Culture, blood (routine x 2)     Status: None (Preliminary result)   Collection Time: 12/27/19 11:02 AM   Specimen: BLOOD  Result Value Ref Range Status   Specimen Description BLOOD LEFT WRIST  Final   Special Requests   Final    BOTTLES DRAWN AEROBIC AND ANAEROBIC Blood Culture adequate volume   Culture   Final    NO GROWTH < 24 HOURS Performed at [facility], [address] [place], [place] [address]    Report Status PENDING  Incomplete  SARS CORONAVIRUS 2 (TAT 6-24 HRS) Nasopharyngeal Nasopharyngeal Swab     Status: None   Collection Time: 12/27/19 11:59 AM   Specimen: Nasopharyngeal Swab  Result Value Ref Range Status   SARS Coronavirus 2 NEGATIVE NEGATIVE Final    Comment: (NOTE) SARS-CoV-2 target nucleic acids are NOT DETECTED. The SARS-CoV-2 RNA is generally detectable in upper and lower respiratory specimens during the acute phase of infection. Negative results do not preclude SARS-CoV-2 infection, do not rule out co-infections with other pathogens, and should not be used as the sole basis for treatment or other patient management decisions. Negative results must be combined with clinical observations, patient history, and epidemiological information. The expected result is  Negative. Fact Sheet for Patients: SugarRoll.be Fact Sheet for Healthcare Providers: https://www.woods-mathews.com/ This test is not yet approved or cleared by the [place] FDA and  has been authorized for detection and/or diagnosis of SARS-CoV-2 by FDA under an Emergency Use Authorization (EUA).  This EUA will remain  in effect (meaning this test can be used) for the duration of the COVID-19 declaration under Section 56 4(b)(1) of the Act, 21 U.S.C. section 360bbb-3(b)(1), unless the authorization is terminated or revoked sooner. Performed at [facility], [place] [address], [place], [place] [address]   MRSA PCR Screening     Status: None   Collection Time: 12/27/19  8:37 PM   Specimen: Nasopharyngeal  Result Value Ref Range Status   MRSA by PCR NEGATIVE NEGATIVE Final    Comment:        The GeneXpert MRSA Assay (FDA approved for NASAL specimens only), is one component of a comprehensive MRSA colonization surveillance program. It is not intended to diagnose MRSA infection nor to guide or monitor treatment for MRSA infections. Performed at [facility], [address] [place], [place] [address]     Coagulation Studies: No results for input(s): LABPROT, INR in the last 72 hours.  Urinalysis:  Recent Labs  Lab 12/27/19 1036  COLORURINE YELLOW*  LABSPEC 1.020  PHURINE 5.0  GLUCOSEU >=500*  HGBUR NEGATIVE  BILIRUBINUR NEGATIVE  KETONESUR 5*  PROTEINUR NEGATIVE  NITRITE NEGATIVE  LEUKOCYTESUR SMALL*    Lipid Panel:     Component Value Date/Time   TRIG 186 (H) 04/25/2019 0500    HgbA1C:  Lab Results  Component Value Date   HGBA1C 8.1 (H) 03/30/2019    Urine Drug Screen:      Component Value Date/Time   LABOPIA NONE DETECTED 04/01/2019 2311   COCAINSCRNUR NONE DETECTED 04/01/2019 2311   LABBENZ NONE DETECTED 04/01/2019 2311   AMPHETMU NONE DETECTED 04/01/2019 2311   THCU NONE DETECTED 04/01/2019  2311   LABBARB NONE DETECTED 04/01/2019 2311    Alcohol Level: No results for input(s): ETH in the last 168 hours.  Other results: EKG: sinus rhythm at 81 bpm.  Imaging: DG Chest Portable 1 View  Result Date: 12/27/2019 CLINICAL DATA:  Weakness EXAM: PORTABLE CHEST 1 VIEW COMPARISON:  Chest radiographs, 11/18/2019, CT chest, 02/13/2019 FINDINGS: The heart size and mediastinal contours are within normal limits. Unchanged nodule of the peripheral right upper lobe. The visualized skeletal structures are unremarkable. IMPRESSION: No acute abnormality of the lungs in AP portable projection. Unchanged nodule of the peripheral right upper lobe, better evaluated by prior CT. Electronically Signed   By: Eddie Candle M.D.   On: 12/27/2019 10:47     Assessment/Plan: 44 year old male with a history of DM and CKD presenting after being noted by police to be poorly cared for.  Patient is a poor historian but appears chronically ill and as if he has not taken care of himself for quite some time. Reports LE weakness and incontinence of bowel and bladder and although he initially reports this has only been for 5 days I suspect his issues have been progressive over a much longer period of time.  Patient in DKA, with worsening CKD and hyponatremia.  Symptoms may be a consequence of his poorly controlled DM but will rule out other possibilities.   Recommendations: 1. MRI of the cervical, lumbar and thoracic spines without contrast 2. B1, B12, folate, TSH 3. PT and OT consults 4. Would supplement thiamine  Alexis Goodell, MD Neurology [phone] 12/28/2019, 12:12 PM

## 2019-12-28 NOTE — Consult Note (Addendum)
[facility] GI Inpatient Consult Note   Kathline Magic, M.D.  Reason for Consult:  Anemia, failure to thrive   Attending Requesting Consult: Florina Ou, M.D.   History of Present Illness: Gregory Moody is a 44 y.o. male with a history of type 1 diabetes mellitus admitted last evening after Adult Protective Services was called due to his poor living conditions.  Patient reportedly has a history of severe clinical depression and has been neglecting his self-care with minimal to no medications or nutrition.  Reportedly the patient had no food for almost 5 days prior to APS being called.  Patient lives with his mother who has reportedly been helping take care of him. The GI service was called due to failure to thrive, anemia, weakness.  Denies any symptoms of melena, hematemesis or hematochezia.  He denies any significant constipation but has some intermittent diarrhea.  He denies any upper GI symptoms such as heartburn, dysphagia.  He has significant nausea and anorexia and does not want to eat.  He feels severely depressed and reports he does "not want to live anymore".  There are tentative plans for the patient to go to inpatient psychiatric care after his  diabetic ketoacidosis has resolved. The patient is nave to both endoscopy and colonoscopy.  He denies a family history of colorectal cancer or upper GI cancers.  Past Medical History:  Past Medical History:  Diagnosis Date  . COVID-19   . Diabetes mellitus without complication ([facility])   . Gastroparesis   . Tuberculosis     Problem List: Patient Active Problem List   Diagnosis Date Noted  . DKA (diabetic ketoacidoses) ([place]) 12/27/2019  . Depression 12/27/2019  . DKA, type 2 ([place]) 12/27/2019  . Anemia of chronic disease 04/25/2019  . Hypomagnesemia 04/25/2019  . Hypotension 04/23/2019  . CAP (community acquired pneumonia) due to MSSA (methicillin sensitive Staphylococcus aureus) ([place]) 04/14/2019  . COVID-19 virus  infection 04/14/2019  . Nausea and vomiting 04/02/2019  . Nausea vomiting and diarrhea   . Pressure injury of skin 03/31/2019  . Acute renal failure (ARF) ([place]) 03/06/2019  . CAP (community acquired pneumonia) 02/14/2019  . AKI (acute kidney injury) ([place]) 02/02/2019  . ARF (acute renal failure) ([place]) 01/03/2019  . Malnutrition of moderate degree 02/24/2018  . GERD (gastroesophageal reflux disease) 02/23/2018  . Diabetic gastroparesis ([place]) 02/23/2018  . Diabetic foot ulcer ([place]) 02/23/2018  . Aspiration pneumonia ([place]) 04/21/2017  . Hypoglycemia 04/21/2017  . Diabetes mellitus with hyperglycemia ([place]) 04/21/2017  . Hip fracture, unspecified laterality, closed, initial encounter ([place]) 04/17/2017  . Hyperglycemia 04/17/2017  . Protein-calorie malnutrition, severe 04/09/2016  . Cavitary lesion of lung 04/06/2016    Past Surgical History: Past Surgical History:  Procedure Laterality Date  . ESOPHAGOGASTRODUODENOSCOPY N/A 02/03/2019   Procedure: ESOPHAGOGASTRODUODENOSCOPY (EGD);  Surgeon: Odell Fasching, Benay Pike, MD;  Location: [facility] ENDOSCOPY;  Service: Gastroenterology;  Laterality: N/A;    Allergies: No Known Allergies  Home Medications: Medications Prior to Admission  Medication Sig Dispense Refill Last Dose  . insulin NPH-regular Human (70-30) 100 UNIT/ML injection Inject 20 Units into the skin 2 (two) times daily with a meal. 25 units with breakfast in am and 35 units with supper 10 mL 11 Past Month at Unknown time  . aspirin EC 81 MG tablet Take 81 mg by mouth daily.   Not Taking at Unknown time  . loperamide (IMODIUM A-D) 2 MG tablet Take 1 tablet (2 mg total) by mouth 4 (four) times daily as needed for  diarrhea or loose stools. (Patient not taking: Reported on 12/27/2019) 20 tablet 0 Not Taking at Unknown time  . naproxen (NAPROSYN) 500 MG tablet Take 1 tablet (500 mg total) by mouth 2 (two) times daily with a meal. (Patient not taking: Reported on 12/27/2019) 20 tablet 2 Not Taking at  Unknown time  . ondansetron (ZOFRAN) 4 MG tablet Take 1 tablet (4 mg total) by mouth every 6 (six) hours as needed for nausea. (Patient not taking: Reported on 12/27/2019) 20 tablet 0 Not Taking at Unknown time  . pantoprazole (PROTONIX) 40 MG tablet Take 1 tablet (40 mg total) by mouth 2 (two) times daily before a meal. (Patient not taking: Reported on 12/27/2019) 60 tablet 2 Not Taking at Unknown time   Home medication reconciliation was completed with the patient.   Scheduled Inpatient Medications:   . sodium chloride   Intravenous Once  . aspirin EC  81 mg Oral Daily  . Chlorhexidine Gluconate Cloth  6 each Topical Daily  . heparin  5,000 Units Subcutaneous Q8H  . insulin aspart  0-5 Units Subcutaneous QHS  . insulin aspart  0-9 Units Subcutaneous TID WC  . insulin aspart  3 Units Subcutaneous TID WC  . insulin glargine  10 Units Subcutaneous QHS  . mouth rinse  15 mL Mouth Rinse BID  . [START ON 12/29/2019] multivitamin with minerals  1 tablet Oral Daily  . Ensure Max Protein  11 oz Oral BID BM    Continuous Inpatient Infusions:   . sodium chloride 75 mL/hr at 12/28/19 0430    PRN Inpatient Medications:  dextrose  Family History: family history includes Diabetes in his father and mother.   GI Family History: Negative  Social History:   reports that he has been smoking. He has never used smokeless tobacco. He reports that he does not drink alcohol or use drugs. The patient denies ETOH, tobacco, or drug use.    Review of Systems: Review of Systems - History obtained from the patient General ROS: positive for  - weight loss negative for - fever or weight gain Psychological ROS: positive for - anxiety, behavioral disorder, depression and mood swings negative for - suicidal ideation ENT ROS: negative Allergy and Immunology ROS: negative Hematological and Lymphatic ROS: negative Endocrine ROS: positive for - malaise/lethargy negative for - galactorrhea or temperature  intolerance Respiratory ROS: no cough, shortness of breath, or wheezing Cardiovascular ROS: no chest pain or dyspnea on exertion Genito-Urinary ROS: no dysuria, trouble voiding, or hematuria Musculoskeletal ROS: positive for - muscular weakness negative for - joint pain or joint stiffness Neurological ROS: no TIA or stroke symptoms Dermatological ROS: negative  Physical Examination: BP (!) 87/65   Pulse 72   Temp (!) 97.2 F (36.2 C)   Resp 14   Ht 5\' 7"  (1.702 m)   Wt 50.1 kg   SpO2 98%   BMI 17.30 kg/m  Physical Exam Vitals and nursing note reviewed.  Constitutional:      General: He is not in acute distress.    Appearance: He is ill-appearing. He is not toxic-appearing or diaphoretic.  HENT:     Head: Normocephalic and atraumatic.     Nose: Nose normal.  Cardiovascular:     Rate and Rhythm: Normal rate.     Pulses: Normal pulses.  Pulmonary:     Effort: Pulmonary effort is normal.  Abdominal:     General: Abdomen is flat. There is no distension.     Palpations: There is no  mass.     Tenderness: There is no abdominal tenderness.     Hernia: No hernia is present.  Musculoskeletal:     Cervical back: Normal range of motion.  Skin:    General: Skin is warm.  Neurological:     General: No focal deficit present.     Mental Status: He is alert.  Psychiatric:        Mood and Affect: Mood is depressed. Affect is blunt.        Speech: Speech normal.        Behavior: Behavior normal.        Thought Content: Thought content normal.     Data: Lab Results  Component Value Date   WBC 9.1 12/28/2019   HGB 8.1 (L) 12/28/2019   HCT 24.1 (L) 12/28/2019   MCV 92.3 12/28/2019   PLT 173 12/28/2019   Recent Labs  Lab 12/27/19 1036 12/28/19 0542  HGB 10.2* 8.1*   Lab Results  Component Value Date   NA 133 (L) 12/28/2019   K 3.3 (L) 12/28/2019   CL 106 12/28/2019   CO2 19 (L) 12/28/2019   BUN 45 (H) 12/28/2019   CREATININE 1.66 (H) 12/28/2019   Lab Results   Component Value Date   ALT 10 12/27/2019   AST 12 (L) 12/27/2019   ALKPHOS 75 12/27/2019   BILITOT 2.0 (H) 12/27/2019   No results for input(s): APTT, INR, PTT in the last 168 hours. CBC Latest Ref Rng & Units 12/28/2019 12/27/2019 11/18/2019  WBC 4.0 - 10.5 K/uL 9.1 10.9(H) 9.5  Hemoglobin 13.0 - 17.0 g/dL 8.1(L) 10.2(L) 8.7(L)  Hematocrit 39.0 - 52.0 % 24.1(L) 31.5(L) 25.0(L)  Platelets 150 - 400 K/uL 173 245 306    STUDIES: DG Chest Portable 1 View  Result Date: 12/27/2019 CLINICAL DATA:  Weakness EXAM: PORTABLE CHEST 1 VIEW COMPARISON:  Chest radiographs, 11/18/2019, CT chest, 02/13/2019 FINDINGS: The heart size and mediastinal contours are within normal limits. Unchanged nodule of the peripheral right upper lobe. The visualized skeletal structures are unremarkable. IMPRESSION: No acute abnormality of the lungs in AP portable projection. Unchanged nodule of the peripheral right upper lobe, better evaluated by prior CT. Electronically Signed   By: Eddie Candle M.D.   On: 12/27/2019 10:47   @IMAGES @  Assessment:  1. Anemia- Chronic,  likely secondary to nutritional deficiency, poor po intake as a side effect of depression and uncontrolled Type I DM. No reported past or current episodes of overt gastrointestinal bleeding.  2. Diabetic keotoacidosis - Currently being managed in ICU.  3. Severe depression - Seen by Dr. Claris Gower from Psychiatry . Plan for patient is to transfer to inpatient psychiatry ward after DKA is controlled.    COVID-19 status: Tested negative      Recommendations:  1. Continue current treatment of metabolic derangement. 2. No plans for endoscopy or colonoscopy given lack of overt bleeding issues and high likelihood that anemia will improve with proper nutrition. 3. Follow H/H, call us If overt bleeding of the GI tract occurs. May consider outpatient workup with EGD and colonoscopy after psychiatric illness has improved.  Thank you for the consult. Please  call with questions or concerns.  Olean Ree, "Lanny Hurst MD [facility] [place], [place] [address] [phone]  12/28/2019 1:12 PM

## 2019-12-28 NOTE — Progress Notes (Signed)
OT Cancellation Note  Patient Details Name: Navdeep Halt MRN: [id number] DOB: 1976-01-02   Cancelled Treatment:    Reason Eval/Treat Not Completed: Medical issues which prohibited therapy;Other (comment)   Patient is medically complex involving downtrending lab values and vitals.  He is not appropriate at this time; will hold therapy evaluation.  Will follow up as able and appropriate.  Thank you.  Oren Binet 12/28/2019, 10:56 AM

## 2019-12-28 NOTE — Progress Notes (Signed)
CSW was informed patient is currently open with [facility].   APS Case Worker Otilio Connors #: [place], LCSW [phone]

## 2019-12-28 NOTE — Progress Notes (Signed)
Inpatient Diabetes Program Recommendations  AACE/ADA: New Consensus Statement on Inpatient Glycemic Control   Target Ranges:  Prepandial:   less than 140 mg/dL      Peak postprandial:   less than 180 mg/dL (1-2 hours)      Critically ill patients:  140 - 180 mg/dL   Results for Gregory Moody, Gregory Moody (MRN [id number]) as of 12/28/2019 08:21  Ref. Range 12/27/2019 12:19 12/27/2019 12:55 12/27/2019 13:56 12/27/2019 15:06 12/27/2019 15:52 12/27/2019 17:32 12/27/2019 19:27 12/27/2019 20:39 12/27/2019 22:00 12/27/2019 22:56 12/27/2019 23:53 12/28/2019 00:59 12/28/2019 07:37  Glucose-Capillary Latest Ref Range: 70 - 99 mg/dL 361 (H) 333 (H) 280 (H) 204 (H) 207 (H) 139 (H) 178 (H) 182 (H) 140 (H) 151 (H)  Lantus 10 units @22 :56 161 (H) 206 (H)  Novolog 3 units 105 (H)  Novolog 3 units   Review of Glycemic Control  Diabetes history: DM1 Outpatient Diabetes medications: 70/30 25 units QAM, 70/30 35 units QPM Current orders for Inpatient glycemic control: Lantus 10 units QHS, Novolog 0-9 units TID with meals, Novolog 0-5 units QHS, Novolog 3 units TID with meals for meal coverage  NOTE: Noted patient seen by Dr. Claris Gower (Psychiatry) and per note on 12/27/19, patient had not taken insulin and had not eaten in 5 days. Patient acknowledged severe depression and stated that he does not want to live anymore. Once medically stable, plan for inpatient behavioral hospitalization. Patient was ordered IV insulin and received Lantus 10 units at 22:56 on 12/27/19 and fasting glucose 105 mg/dl this morning. Agree with current orders for glycemic control at this time. Will continue follow along while inpatient.  Thanks, Barnie Alderman, RN, MSN, CDE Diabetes Coordinator Inpatient Diabetes Program [phone] (Team Pager from 8am to 5pm)

## 2019-12-28 NOTE — Progress Notes (Addendum)
CSW acknowledges consult for HH/DME/SNF/ALF/medication needs. Patient was seen by Psychiatry yesterday and per notes the plan is for psychiatric inpatient hospitalization after he is medically stable. TOC will follow-up after patient is medically and emotionally stable and PT/OT evaluations have been completed.   Oleh Genin, [place]

## 2019-12-28 NOTE — Progress Notes (Signed)
Slayter Moorhouse EHM:[id number] DOB: 1976-03-03 DOA: 12/27/2019 .  PCP: [place]  Patient coming from: Home  I have personally briefly reviewed patient's old medical records in [place]  Chief Complaint: Weakness  HPI: Gregory Moody is a 44 y.o. male with medical history significant for diabetes mellitus with complications of gastroparesis and chronic kidney disease who was brought into the emergency room for evaluation of generalized weakness. Per EMS, police were called to patient's home to serve a warrant on another resident. When they noted the condition the patient was living in, APS was contacted and they recommended the patient be brought to the ER for further evaluation. Patient states that he has not had anything to eat for at least 5 days and complained of nausea and vomiting but denied having any abdominal pain or changes in his bowel habits. Per EMS, he told them that he was not eating because he wanted to die but does not have any active suicide ideations at this time. Patient lives at home with his mother who he states is his caregiver. He complains of being very weak to ambulate for about 5 days. He denies having any chest pain, shortness of breath, orthopnea or paroxysmal nocturnal dyspnea. He denies being any fever, chills, headaches or any mental status changes  ED Course: Patient was referred to the emergency room by APS because of poor living conditions. Labs revealed significant hyperglycemia with an anion gap metabolic acidosis. He was also hypotensive but asymptomatic. He was started on an insulin drip and received IV fluid hydration. He will be admitted to the hospital for further evaluation .  Pt seen for f/u. His insulin ggt stopped at midnight and he is alert awake and denies any complaints.    Review of Systems: As per HPI otherwise 10 point review of systems negative.      Past Medical History:  Diagnosis Date  . COVID-19   .  Diabetes mellitus without complication ([place])   . Gastroparesis   . Tuberculosis         Past Surgical History:  Procedure Laterality Date  . ESOPHAGOGASTRODUODENOSCOPY N/A 02/03/2019   Procedure: ESOPHAGOGASTRODUODENOSCOPY (EGD); Surgeon: [place], Benay Pike, MD; Location: [facility] ENDOSCOPY; Service: Gastroenterology; Laterality: N/A;   reports that he has been smoking. He has never used smokeless tobacco. He reports that he does not drink alcohol or use drugs.  No Known Allergies       Family History  Problem Relation Age of Onset  . Diabetes Mother   . Diabetes Father           Prior to Admission medications   Medication Sig Start Date End Date Taking? Authorizing Provider  insulin NPH-regular Human (70-30) 100 UNIT/ML injection Inject 20 Units into the skin 2 (two) times daily with a meal. 25 units with breakfast in am and 35 units with supper 04/03/19  Yes [name], MD  aspirin EC 81 MG tablet Take 81 mg by mouth daily.    [provider]  loperamide (IMODIUM A-D) 2 MG tablet Take 1 tablet (2 mg total) by mouth 4 (four) times daily as needed for diarrhea or loose stools.  Patient not taking: Reported on 12/27/2019 04/19/19   [name], MD  naproxen (NAPROSYN) 500 MG tablet Take 1 tablet (500 mg total) by mouth 2 (two) times daily with a meal.  Patient not taking: Reported on 12/27/2019 10/10/19   [name], MD  ondansetron (ZOFRAN) 4 MG tablet Take 1 tablet (  4 mg total) by mouth every 6 (six) hours as needed for nausea.  Patient not taking: Reported on 12/27/2019 04/03/19   [name], MD  pantoprazole (PROTONIX) 40 MG tablet Take 1 tablet (40 mg total) by mouth 2 (two) times daily before a meal.  Patient not taking: Reported on 12/27/2019 04/03/19   [name], MD  Physical Exam:       Vitals:   12/27/19 1026 12/27/19 1028 12/27/19 1048  BP:  (!) 89/64   Pulse:  82   Resp:  18   Temp:   (!) 94.2 F (34.6 C)  TempSrc:   Rectal   SpO2:  95%   Weight: 45.4 kg    Height: 5\' 6"  (1.676 m)          Vitals:   12/27/19 1026 12/27/19 1028 12/27/19 1048  BP:  (!) 89/64   Pulse:  82   Resp:  18   Temp:   (!) 94.2 F (34.6 C)  TempSrc:   Rectal  SpO2:  95%   Weight: 45.4 kg    Height: 5\' 6"  (1.676 m)     Constitutional: NAD, alert and oriented x 3. Frail  Eyes: PERRL, lids and conjunctivae normal  ENMT: Mucous membranes dry  Neck: normal, supple, no masses, no thyromegaly  Respiratory: clear to auscultation bilaterally, no wheezing, no crackles. Normal respiratory effort. No accessory muscle use.  Cardiovascular: Regular rate and rhythm, no murmurs / rubs / gallops. No extremity edema. 2+ pedal pulses. No carotid bruits.  Abdomen: no tenderness, no masses palpated. No hepatosplenomegaly. Bowel sounds positive.  Musculoskeletal: no clubbing / cyanosis.  Skin: no rashes, skin tears to both elbows and wrists of upper extremities, ulcers.  Neurologic: No gross focal neurologic deficit. Generalized weakness  Psychiatric: Flat affect  Labs on Admission: I have personally reviewed following labs and imaging studies  CBC:  Last Labs      Recent Labs  Lab 12/27/19  1036  WBC 10.9*  NEUTROABS 9.7*  HGB 10.2*  HCT 31.5*  MCV 94.9  PLT 478  Basic Metabolic Panel:  Last Labs      Recent Labs  Lab 12/27/19  1036  NA 133*  K 3.9  CL 93*  CO2 15*  GLUCOSE 408*  BUN 68*  CREATININE 3.15*  CALCIUM 8.5*  MG 2.8*  PHOS 5.3*  GFR:  Estimated Creatinine Clearance: 19.2 mL/min (A) (by C-G formula based on SCr of 3.15 mg/dL (H)).  Liver Function Tests:  Last Labs      Recent Labs  Lab 12/27/19  1036  AST 12*  ALT 10  ALKPHOS 75  BILITOT 2.0*  PROT 7.0  ALBUMIN 3.2*   Last Labs      Recent Labs  Lab 12/27/19  1036  LIPASE 39   Last Labs   No results for input(s): AMMONIA in the last 168 hours.  Coagulation Profile:  Last Labs   No results for input(s): INR, PROTIME in the last 168 hours.   Cardiac Enzymes:  Last Labs   No results for input(s): CKTOTAL, CKMB, CKMBINDEX, TROPONINI in the last 168 hours.  BNP (last 3 results)  Recent Labs (within last 365 days)  No results for input(s): PROBNP in the last 8760 hours.  HbA1C:  Recent Labs (last 2 labs)   No results for input(s): HGBA1C in the last 72 hours.  CBG:  Last Labs        Recent Labs  Lab 12/27/19  1219 12/27/19  1255 12/27/19  1356  GLUCAP 361* 333* 280*  Lipid Profile:  Recent Labs (last 2 labs)   No results for input(s): CHOL, HDL, LDLCALC, TRIG, CHOLHDL, LDLDIRECT in the last 72 hours.  Thyroid Function Tests:  Recent Labs (last 2 labs)   No results for input(s): TSH, T4TOTAL, FREET4, T3FREE, THYROIDAB in the last 72 hours.  Anemia Panel:  Recent Labs (last 2 labs)   No results for input(s): VITAMINB12, FOLATE, FERRITIN, TIBC, IRON, RETICCTPCT in the last 72 hours.  Urine analysis:  Labs (Brief)         Component Value Date/Time   COLORURINE YELLOW (A) 12/27/2019 1036   APPEARANCEUR HAZY (A) 12/27/2019 1036   LABSPEC 1.020 12/27/2019 1036   PHURINE 5.0 12/27/2019 1036   GLUCOSEU >=500 (A) 12/27/2019 1036   HGBUR NEGATIVE 12/27/2019 1036   BILIRUBINUR NEGATIVE 12/27/2019 1036   KETONESUR 5 (A) 12/27/2019 1036   PROTEINUR NEGATIVE 12/27/2019 1036   NITRITE NEGATIVE 12/27/2019 1036   LEUKOCYTESUR SMALL (A) 12/27/2019 1036  Radiological Exams on Admission:  Imaging Results (Last 48 hours)  DG Chest Portable 1 View  Result Date: 12/27/2019  CLINICAL DATA: Weakness EXAM: PORTABLE CHEST 1 VIEW COMPARISON: Chest radiographs, 11/18/2019, CT chest, 02/13/2019 FINDINGS: The heart size and mediastinal contours are within normal limits. Unchanged nodule of the peripheral right upper lobe. The visualized skeletal structures are unremarkable. IMPRESSION: No acute abnormality of the lungs in AP portable projection. Unchanged nodule of the peripheral right upper lobe, better evaluated by prior CT. Electronically  Signed By: Eddie Candle M.D. On: 12/27/2019 10:47   EKG: Independently reviewed.  Sinus rhythm  Premature ventricular complexes  Assessment/Plan  Principal Problem:  DKA (diabetic ketoacidoses) (HCC)  Active Problems:  Protein-calorie malnutrition, severe  GERD (gastroesophageal reflux disease)  Pressure injury of skin  Anemia of chronic disease  Depression   Diabetic ketoacidosis  Secondary to medication non compliance and poor oral intake  Patient noted to have significant hyperglycemia with an anion gap of 25 on admission  Start patient on an insulin drip  Aggressive IV fluid hydration  Serial electrolytes per protocol .  Acute kidney injury  Prerenal and secondary to poor oral intake and dehydration  Baseline serum creatinine is 1.3 and on admission it is 3.15  Will hydrate patient  Repeat renal parameters in am   Depression  Patient verbalized not wanting to live and had been starving himself  Has no active suicidal ideation at this time  Will consult psychaitry   Protein calorie malnutrition (Severe)  Will consult dietician  Patient will benefit from nutritional supplements   Anemia of chronic disease  Stable .  C.Diff Diarrhea: We will start pt on po vancomycin.  Poor living condition  Patient was referred to the ER by APS because of poor living conditions  Patient may need alternative living conditions upon discharge  DVT prophylaxis: Heparin  Code Status: Full code  Family Communication: Plan of care was discussed with patient at the bedside. He verbalizes understanding and agrees with the plan  Disposition Plan: Will need placement upon discharge  Consults called: Case management, Psychaitry

## 2019-12-28 NOTE — Progress Notes (Addendum)
Initial Nutrition Assessment  DOCUMENTATION CODES:   Severe malnutrition in context of social or environmental circumstances, Underweight  INTERVENTION:  Recommend liberalizing diet to carbohydrate modified.  Provide Ensure Max Protein po BID, each supplement provides 150 kcal and 30 grams of protein.  Provide daily MVI.  Provided patient with [employer] from [place].  Monitor magnesium, potassium, and phosphorus daily for at least 3 days, MD to replete as needed, as pt is at risk for refeeding syndrome given severe malnutrition.  NUTRITION DIAGNOSIS:   Severe Malnutrition related to social / environmental circumstances(inadequate oral intake, limited access to food) as evidenced by per patient/family report, severe fat depletion, severe muscle depletion.  GOAL:   Patient will meet greater than or equal to 90% of their needs  MONITOR:   PO intake, Supplement acceptance, Labs, Weight trends, I & O's, Skin  REASON FOR ASSESSMENT:   Malnutrition Screening Tool, Consult Assessment of nutrition requirement/status  ASSESSMENT:   44 year old male with PMHx of TB, DM, gastroparesis, hx COVID-19 infection 04/2019 admitted with DKA, AKI, anemia of chronic disease, depression.   Met with patient at bedside. He is known to this RD from previous admissions. Patient reports he has had a poor appetite and intake for several weeks now. He is unable to provide exact details of intake but reports he had been eating very little at home. He reports he has limited access to food at home. According to discussion on rounds patient ate well at breakfast this morning. He could not recall how he had eaten at breakfast when RD was in room. Patient is amenable to drinking oral nutrition supplements at this time. Will also provide patient handout on community resources in [place].  Patient's UBW was 140-150 lbs. His weight tends to fluctuate over the years. In 2018  he was underweight and met criteria for severe malnutrition. Then in 2019 he gained weight and only met criteria for moderate malnutrition. He has now lost weight again. Patient was 56-59 kg for most of 2020 but unsure which weights were truly measured. He is currently 50.1 kg (110.45 lbs).  Medications reviewed and include: Novolog 0-9 units TID, Novolog 0-5 units QHS, Novolog 3 units TID, Lantus 10 units QHS, NS at 75 mL/hr.  Labs reviewed: CBG 105, Sodium 133, Potassium 3.3, CO2 19, BUN 45, Creatinine 1.66.  Discussed with RN and on rounds. Plan is for inpatient psychiatric admission once patient is cleared medically.  NUTRITION - FOCUSED PHYSICAL EXAM:    Most Recent Value  Orbital Region  Severe depletion  Upper Arm Region  Severe depletion  Thoracic and Lumbar Region  Severe depletion  Buccal Region  Severe depletion  Temple Region  Severe depletion  Clavicle Bone Region  Severe depletion  Clavicle and Acromion Bone Region  Severe depletion  Scapular Bone Region  Severe depletion  Dorsal Hand  Severe depletion  Patellar Region  Severe depletion  Anterior Thigh Region  Severe depletion  Posterior Calf Region  Severe depletion  Edema (RD Assessment)  None  Hair  Reviewed  Eyes  Reviewed  Mouth  Reviewed  Skin  Reviewed  Nails  Reviewed     Diet Order:   Diet Order            Diet heart healthy/carb modified Room service appropriate? Yes; Fluid consistency: Thin  Diet effective now             EDUCATION NEEDS:   Not appropriate for education at  this time  Skin:  Skin Assessment: Reviewed RN Assessment  Last BM:  12/28/2019 type 7  Height:   Ht Readings from Last 1 Encounters:  12/28/19 '5\' 7"'  (1.702 m)   Weight:   Wt Readings from Last 1 Encounters:  12/28/19 50.1 kg   Ideal Body Weight:  67.3 kg  BMI:  Body mass index is 17.3 kg/m.  Estimated Nutritional Needs:   Kcal:  1800-2000  Protein:  90-100 grams  Fluid:  1.5-1.8 L/day  Jacklynn Barnacle, MS,  RD, LDN Pager number available on Amion

## 2019-12-28 NOTE — Progress Notes (Signed)
PT Cancellation Note  Patient Details Name: Gregory Moody MRN: [id number] DOB: 1976/03/13   Cancelled Treatment:    Reason Eval/Treat Not Completed: Other (comment). Consult received and chart reviewed. Pt currently scheduled for blood transfusion this date due to low Hgb. Pt also with low BP this date, currently 87/65 and has been trending low. Not appropriate for PT consult at this time. Will re-attempt when medically stable.   [name] 12/28/2019, 10:50 AM  [name], PT, DPT [phone]

## 2019-12-28 NOTE — Progress Notes (Signed)
On transfer to 1A noted pressure ulcer stage II to L elbow and pressure ulcer stage III to R elbow.  Pt also has thick, milky white discharge from penis. Pt c/o sore throat and difficulty painful swallowing. No difficulty swallowing noted.  Dr. Florina Ou notified. Per MD to order wound consult.  MD to place additional orders.

## 2019-12-29 ENCOUNTER — Inpatient Hospital Stay: Payer: Self-pay

## 2019-12-29 DIAGNOSIS — R1310 Dysphagia, unspecified: Secondary | ICD-10-CM

## 2019-12-29 DIAGNOSIS — E162 Hypoglycemia, unspecified: Secondary | ICD-10-CM

## 2019-12-29 DIAGNOSIS — D638 Anemia in other chronic diseases classified elsewhere: Secondary | ICD-10-CM

## 2019-12-29 LAB — CBC WITH DIFFERENTIAL/PLATELET
Abs Immature Granulocytes: 0.05 10*3/uL (ref 0.00–0.07)
Basophils Absolute: 0 10*3/uL (ref 0.0–0.1)
Basophils Relative: 0 %
Eosinophils Absolute: 0.1 10*3/uL (ref 0.0–0.5)
Eosinophils Relative: 1 %
HCT: 24.3 % — ABNORMAL LOW (ref 39.0–52.0)
Hemoglobin: 8.3 g/dL — ABNORMAL LOW (ref 13.0–17.0)
Immature Granulocytes: 0 %
Lymphocytes Relative: 7 %
Lymphs Abs: 0.9 10*3/uL (ref 0.7–4.0)
MCH: 31.2 pg (ref 26.0–34.0)
MCHC: 34.2 g/dL (ref 30.0–36.0)
MCV: 91.4 fL (ref 80.0–100.0)
Monocytes Absolute: 0.3 10*3/uL (ref 0.1–1.0)
Monocytes Relative: 3 %
Neutro Abs: 10.1 10*3/uL — ABNORMAL HIGH (ref 1.7–7.7)
Neutrophils Relative %: 89 %
Platelets: 166 10*3/uL (ref 150–400)
RBC: 2.66 MIL/uL — ABNORMAL LOW (ref 4.22–5.81)
RDW: 12.8 % (ref 11.5–15.5)
WBC: 11.4 10*3/uL — ABNORMAL HIGH (ref 4.0–10.5)
nRBC: 0 % (ref 0.0–0.2)

## 2019-12-29 LAB — OCCULT BLOOD X 1 CARD TO LAB, STOOL: Fecal Occult Bld: POSITIVE — AB

## 2019-12-29 LAB — CHLAMYDIA/NGC RT PCR (ARMC ONLY)
Chlamydia Tr: NOT DETECTED
N gonorrhoeae: NOT DETECTED

## 2019-12-29 LAB — LIPASE, BLOOD: Lipase: 22 U/L (ref 11–51)

## 2019-12-29 LAB — COMPREHENSIVE METABOLIC PANEL
ALT: 7 U/L (ref 0–44)
AST: 10 U/L — ABNORMAL LOW (ref 15–41)
Albumin: 2.3 g/dL — ABNORMAL LOW (ref 3.5–5.0)
Alkaline Phosphatase: 59 U/L (ref 38–126)
Anion gap: 7 (ref 5–15)
BUN: 27 mg/dL — ABNORMAL HIGH (ref 6–20)
CO2: 21 mmol/L — ABNORMAL LOW (ref 22–32)
Calcium: 8 mg/dL — ABNORMAL LOW (ref 8.9–10.3)
Chloride: 105 mmol/L (ref 98–111)
Creatinine, Ser: 1.23 mg/dL (ref 0.61–1.24)
GFR calc Af Amer: >60 mL/min (ref >60)
GFR calc non Af Amer: >60 mL/min (ref >60)
Glucose, Bld: 221 mg/dL — ABNORMAL HIGH (ref 70–99)
Potassium: 3.7 mmol/L (ref 3.5–5.1)
Sodium: 133 mmol/L — ABNORMAL LOW (ref 135–145)
Total Bilirubin: 0.6 mg/dL (ref 0.3–1.2)
Total Protein: 5.4 g/dL — ABNORMAL LOW (ref 6.5–8.1)

## 2019-12-29 LAB — MAGNESIUM
Magnesium: 1.9 mg/dL (ref 1.7–2.4)
Magnesium: 1.9 mg/dL (ref 1.7–2.4)

## 2019-12-29 LAB — HEMOGLOBIN A1C
Hgb A1c MFr Bld: 15 % — ABNORMAL HIGH (ref 4.8–5.6)
Mean Plasma Glucose: 383.8 mg/dL

## 2019-12-29 LAB — RETICULOCYTES
Immature Retic Fract: 9 % (ref 2.3–15.9)
RBC.: 2.71 MIL/uL — ABNORMAL LOW (ref 4.22–5.81)
Retic Count, Absolute: 42.5 10*3/uL (ref 19.0–186.0)
Retic Ct Pct: 1.6 % (ref 0.4–3.1)

## 2019-12-29 LAB — IRON AND TIBC
Iron: 12 ug/dL — ABNORMAL LOW (ref 45–182)
Saturation Ratios: 8 % — ABNORMAL LOW (ref 17.9–39.5)
TIBC: 144 ug/dL — ABNORMAL LOW (ref 250–450)
UIBC: 132 ug/dL

## 2019-12-29 LAB — GLUCOSE, CAPILLARY
Glucose-Capillary: 109 mg/dL — ABNORMAL HIGH (ref 70–99)
Glucose-Capillary: 205 mg/dL — ABNORMAL HIGH (ref 70–99)
Glucose-Capillary: 255 mg/dL — ABNORMAL HIGH (ref 70–99)
Glucose-Capillary: 58 mg/dL — ABNORMAL LOW (ref 70–99)

## 2019-12-29 LAB — RPR: RPR Ser Ql: NONREACTIVE

## 2019-12-29 LAB — PHOSPHORUS
Phosphorus: 1.7 mg/dL — ABNORMAL LOW (ref 2.5–4.6)
Phosphorus: 2.9 mg/dL (ref 2.5–4.6)

## 2019-12-29 LAB — FIBRIN DERIVATIVES D-DIMER (ARMC ONLY): Fibrin derivatives D-dimer (ARMC): 1124.5 ng/mL (FEU) — ABNORMAL HIGH (ref 0.00–499.00)

## 2019-12-29 LAB — FERRITIN: Ferritin: 166 ng/mL (ref 24–336)

## 2019-12-29 LAB — HIV ANTIBODY (ROUTINE TESTING W REFLEX): HIV Screen 4th Generation wRfx: NONREACTIVE

## 2019-12-29 IMAGING — CT CT CHEST W/O CM
2 of 3 series · 15 of 36 positions shown, 18 images · non-contrast
Comparison: Chest CT [DATE].

CLINICAL DATA: 43-year-old male with history of pulmonary nodule.
Positive TB gold test [DATE].

EXAM:
CT CHEST WITHOUT CONTRAST
TECHNIQUE: Multidetector CT imaging of the chest was performed following the
standard protocol without IV contrast.

[Series 2: thorax · axial · 0.75mm/px · z∈[-151,+117]mm · 12 of 158 slices shown, 15 images]
[im 12/158  mediastinal]
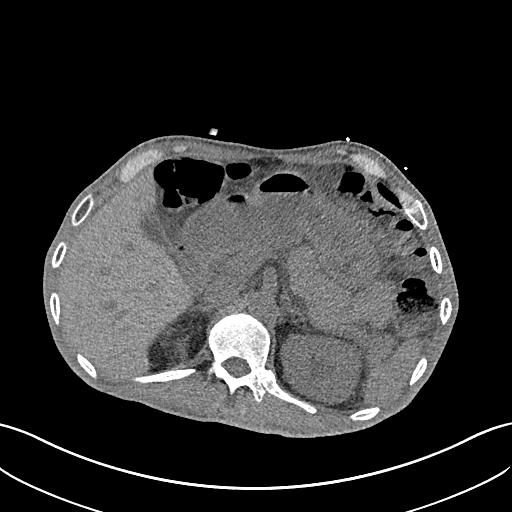
[im 12/158  lung]
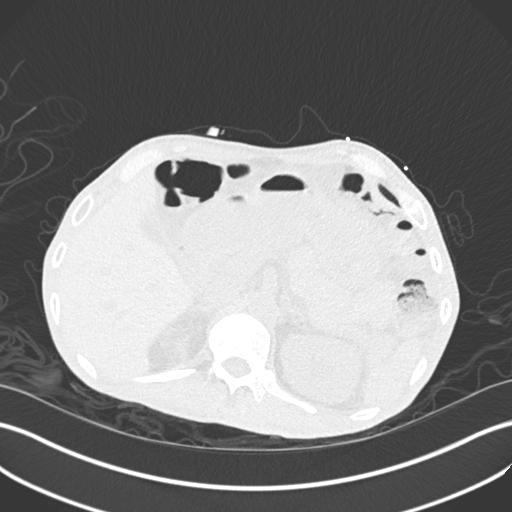
[im 24/158  lung]
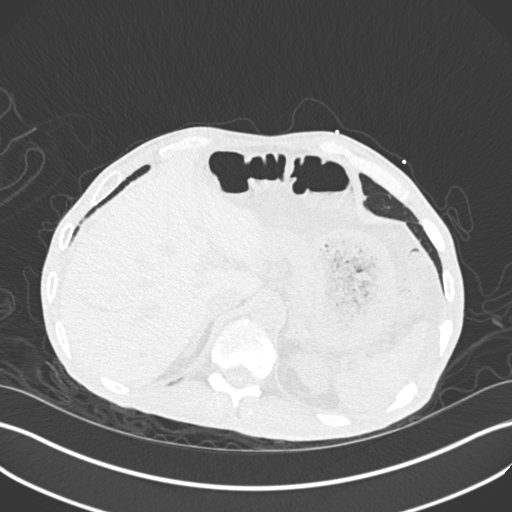
[im 35/158  lung]
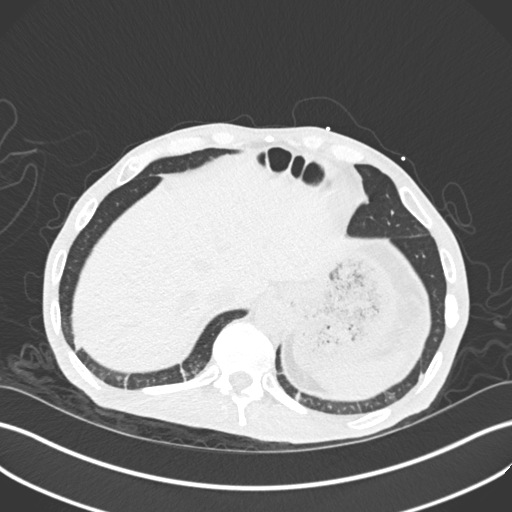
[im 47/158  lung]
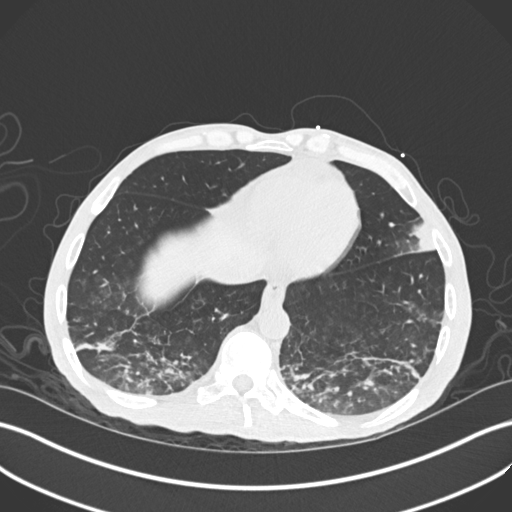
[im 59/158  mediastinal]
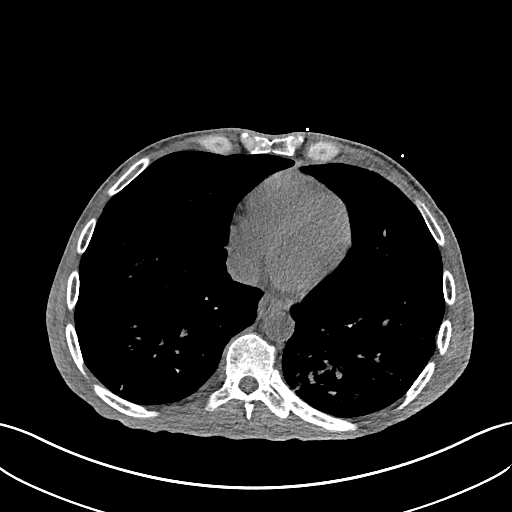
[im 59/158  lung]
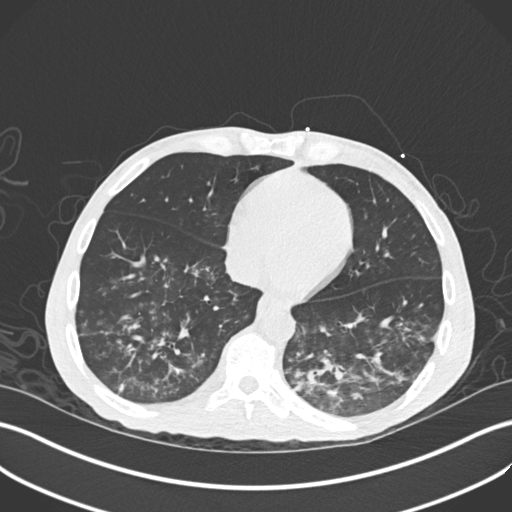
[im 70/158  lung]
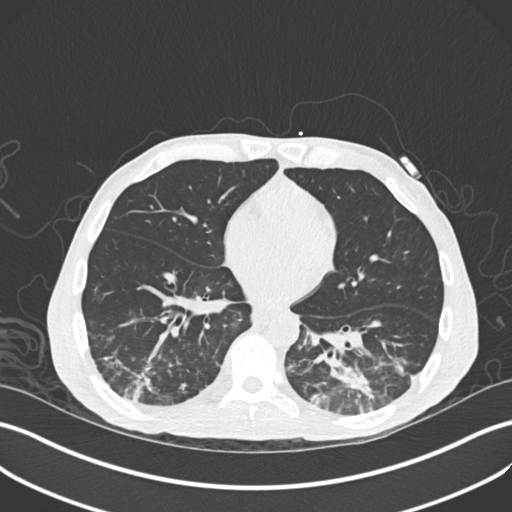
[im 88/158  lung]
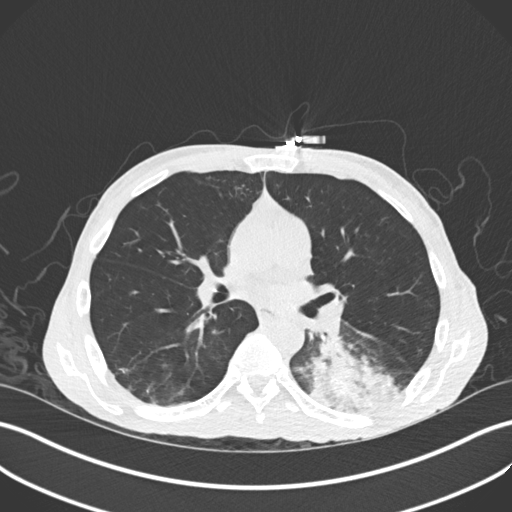
[im 99/158  lung]
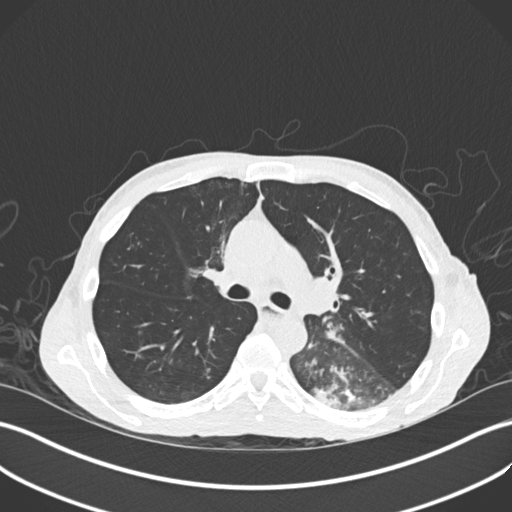
[im 111/158  mediastinal]
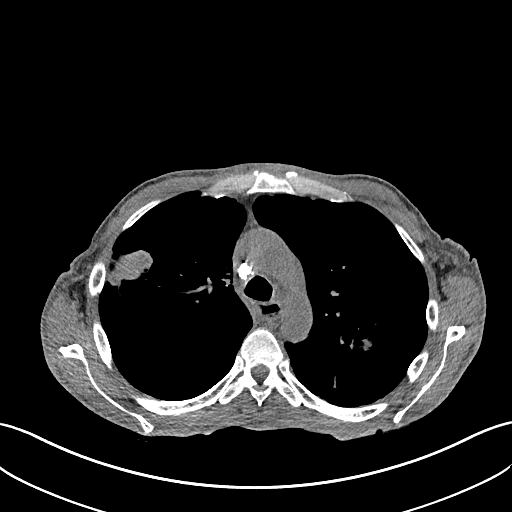
[im 111/158  lung]
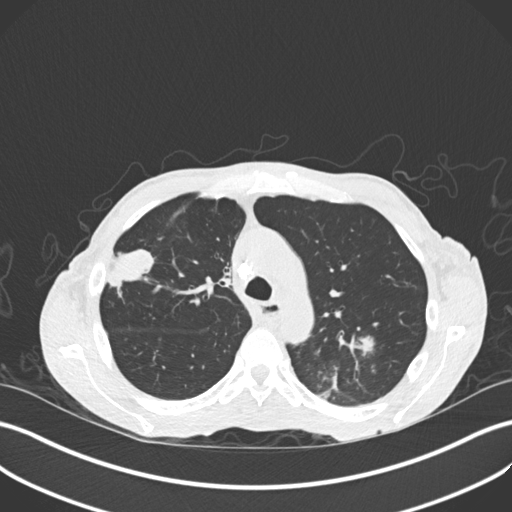
[im 123/158  lung]
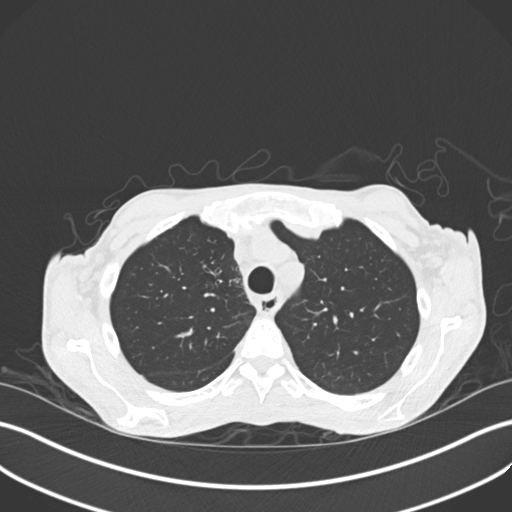
[im 134/158  lung]
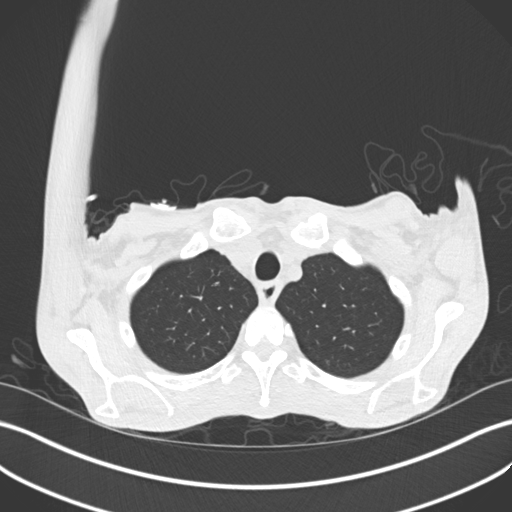
[im 146/158  lung]
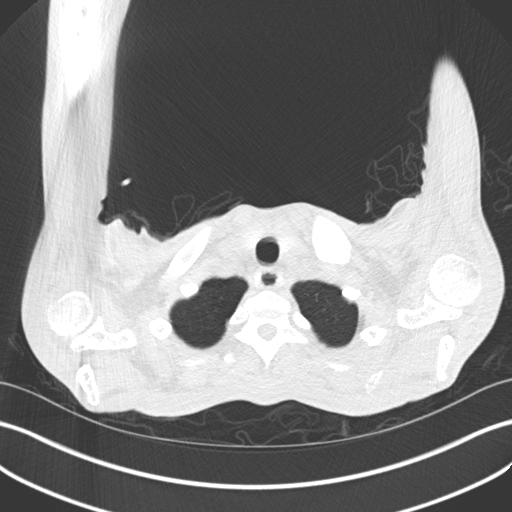

[Series 5: coronal · coronal · 0.66mm/px · 3 of 118 slices shown]
[im 24/118  lung]
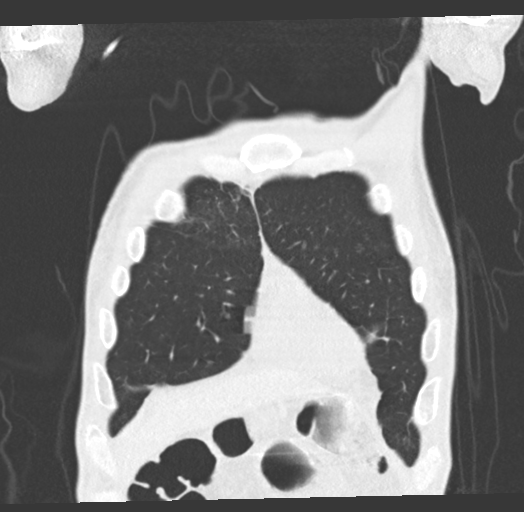
[im 47/118  lung]
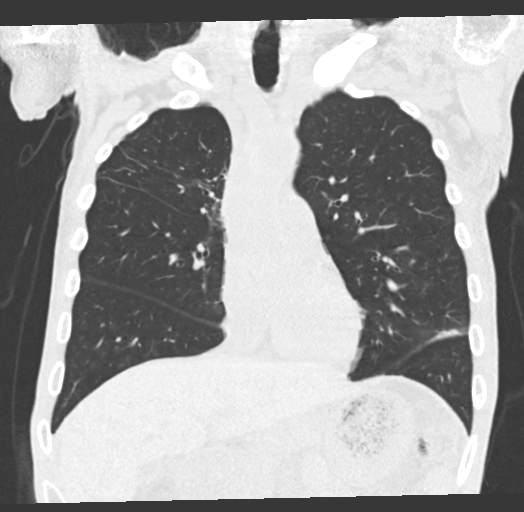
[im 71/118  lung]
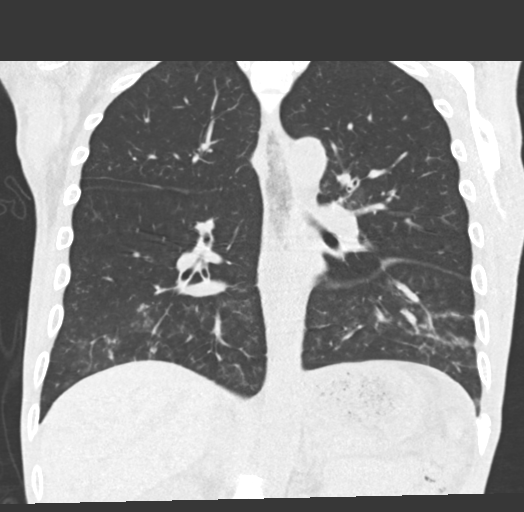

[15 of 36 positions shown; findings below may reference images not displayed]

FINDINGS: Cardiovascular: Heart size is normal. There is no significant
pericardial fluid, thickening or pericardial calcification. There is
aortic atherosclerosis, as well as atherosclerosis of the great
vessels of the mediastinum and the coronary arteries, including
calcified atherosclerotic plaque in the left anterior descending
coronary artery.

Mediastinum/Nodes: No pathologically enlarged mediastinal or hilar
lymph nodes. Please note that accurate exclusion of hilar adenopathy
is limited on noncontrast CT scans. Densely calcified right
paratracheal lymph nodes are incidentally noted. Esophagus is
unremarkable in appearance. No axillary lymphadenopathy.

Lungs/Pleura: Again noted is multifocal nodularity in the lungs,
with the largest single nodular density in the periphery of the
right upper lobe (axial image 48 of series 3) measuring 3.9 x 2.9 cm
on today's examination. Scattered areas of cylindrical
bronchiectasis with thickening of the peribronchovascular
interstitium, regional architectural distortion and volume loss and
extensive peribronchovascular micro and macronodularity, markedly
increased compared to the prior examination, particularly in the
lower lobes of the lungs bilaterally. New areas of airspace
consolidation, most confluent in the superior segment of the left
lower lobe and in the inferior segment of the lingula, as well as in
a patchy peribronchovascular distribution in the posterior aspect of
the left upper lobe, concerning for active infection. No pleural
effusions.

Upper Abdomen: Small volume of ascites.

Musculoskeletal: There are no aggressive appearing lytic or blastic
lesions noted in the visualized portions of the skeleton.
IMPRESSION: 1. There is evidence of active multilobar pneumonia in the lungs
bilaterally, as above. The appearance is nonspecific and could
include both typical or atypical organisms, including mycobacterium
tuberculosis. Further clinical evaluation is strongly recommended.
2. Previously noted nodular density in the periphery of the right
upper lobe is very similar to the prior examination, likely to
represent an area of chronic post infectious or inflammatory
scarring.
3. Aortic atherosclerosis, in addition to left anterior descending
coronary artery disease. Please note that although the presence of
coronary artery calcium documents the presence of coronary artery
disease, the severity of this disease and any potential stenosis
cannot be assessed on this non-gated CT examination. Assessment for
potential risk factor modification, dietary therapy or pharmacologic
therapy may be warranted, if clinically indicated.

These results will be called to the ordering clinician or
representative by the Radiologist Assistant, and communication
documented in the PACS or [REDACTED].

Aortic Atherosclerosis ([KX]-[KX]).

## 2019-12-29 MED ORDER — MAGIC MOUTHWASH
5.0000 mL | Freq: Three times a day (TID) | ORAL | Status: DC | PRN
  Administered 2019-12-29: 5 mL via ORAL
  Filled 2019-12-29 (×5): qty 10

## 2019-12-29 MED ORDER — SUCRALFATE 1 G PO TABS
1.0000 g | ORAL_TABLET | Freq: Three times a day (TID) | ORAL | Status: DC
  Administered 2019-12-29 – 2020-01-03 (×18): 1 g via ORAL
  Filled 2019-12-29 (×19): qty 1

## 2019-12-29 MED ORDER — SODIUM CHLORIDE 0.9 % IV SOLN
1.0000 g | INTRAVENOUS | Status: DC
  Administered 2019-12-29 – 2019-12-30 (×2): 1 g via INTRAVENOUS
  Filled 2019-12-29: qty 10
  Filled 2019-12-29 (×2): qty 1

## 2019-12-29 MED ORDER — LIDOCAINE VISCOUS HCL 2 % MT SOLN
5.0000 mL | Freq: Three times a day (TID) | OROMUCOSAL | Status: DC | PRN
  Administered 2019-12-29: 5 mL via OROMUCOSAL
  Filled 2019-12-29 (×2): qty 15

## 2019-12-29 MED ORDER — PANTOPRAZOLE SODIUM 40 MG IV SOLR
40.0000 mg | Freq: Two times a day (BID) | INTRAVENOUS | Status: DC
  Administered 2019-12-29 – 2020-01-02 (×8): 40 mg via INTRAVENOUS
  Filled 2019-12-29 (×8): qty 40

## 2019-12-29 MED ORDER — SODIUM CHLORIDE 0.9 % IV SOLN
500.0000 mg | INTRAVENOUS | Status: DC
  Administered 2019-12-29 – 2020-01-01 (×4): 500 mg via INTRAVENOUS
  Filled 2019-12-29 (×6): qty 500

## 2019-12-29 MED ORDER — SODIUM PHOSPHATES 45 MMOLE/15ML IV SOLN: 10.0000 mmol | Freq: Once | INTRAVENOUS | Status: DC

## 2019-12-29 MED ORDER — SODIUM CHLORIDE 0.9 % IV BOLUS
500.0000 mL | Freq: Once | INTRAVENOUS | Status: AC
  Administered 2019-12-29: 500 mL via INTRAVENOUS

## 2019-12-29 MED ORDER — SODIUM CHLORIDE 0.9 % IV SOLN: INTRAVENOUS | Status: DC | PRN

## 2019-12-29 NOTE — Progress Notes (Signed)
Gregory Moody FGH:[id number] DOB: 02/10/1976 DOA: 12/27/2019 .  PCP: [facility]  Patient coming from: Home  I have personally briefly reviewed patient's old medical records in [place]  Chief Complaint: Weakness  HPI: Gregory Moody is a 44 y.o. male with medical history significant for diabetes mellitus with complications of gastroparesis and chronic kidney disease who was brought into the emergency room for evaluation of generalized weakness. Per EMS, police were called to patient's home to serve a warrant on another resident. When they noted the condition the patient was living in, APS was contacted and they recommended the patient be brought to the ER for further evaluation. Patient states that he has not had anything to eat for at least 5 days and complained of nausea and vomiting but denied having any abdominal pain or changes in his bowel habits. Per EMS, he told them that he was not eating because he wanted to die but does not have any active suicide ideations at this time. Patient lives at home with his mother who he states is his caregiver. He complains of being very weak to ambulate for about 5 days. He denies having any chest pain, shortness of breath, orthopnea or paroxysmal nocturnal dyspnea. He denies being any fever, chills, headaches or any mental status changes  ED Course: Patient was referred to the emergency room by APS because of poor living conditions. Labs revealed significant hyperglycemia with an anion gap metabolic acidosis. He was also hypotensive but asymptomatic. He was started on an insulin drip and received IV fluid hydration. He will be admitted to the hospital for further evaluation .  12/28/19 Pt seen for f/u. His insulin ggt stopped at midnight and he is alert awake and denies any complaints.  12/29/19 Seen today for follow-up he is alert awake feeling better moving he has done quite with physical therapy his walked to the restroom  and back, his weakness has resolved.  But patient still complains of difficulty with swallowing. Chart review has showed that patient had a positive TB QuantiFERON in 2017, she was followed by therapy for 7 months per patient history.  Patient had a CT scan in May of this year which was concerning for pulmonary nodule with recommendations to repeat CT in a few months during hospitalization when he was evaluated by infectious disease And pulmonary and was discontinued off of any isolation precautions.  Discussed with patient that I am going to need to obtain imaging of his chest in addition to possibly a barium swallow for his odynophagia .  Review of Systems: As per HPI otherwise 10 point review of systems negative.      Past Medical History:  Diagnosis Date  . COVID-19   . Diabetes mellitus without complication ([place])   . Gastroparesis   . Tuberculosis         Past Surgical History:  Procedure Laterality Date  . ESOPHAGOGASTRODUODENOSCOPY N/A 02/03/2019   Procedure: ESOPHAGOGASTRODUODENOSCOPY (EGD); Surgeon: Toledo, Benay Pike, MD; Location: [facility] ENDOSCOPY; Service: Gastroenterology; Laterality: N/A;   reports that he has been smoking. He has never used smokeless tobacco. He reports that he does not drink alcohol or use drugs.  No Known Allergies       Family History  Problem Relation Age of Onset  . Diabetes Mother   . Diabetes Father           Prior to Admission medications   Medication Sig Start Date End Date Taking? Authorizing Provider  insulin NPH-regular Human (  70-30) 100 UNIT/ML injection Inject 20 Units into the skin 2 (two) times daily with a meal. 25 units with breakfast in am and 35 units with supper 04/03/19  Yes [name], MD  aspirin EC 81 MG tablet Take 81 mg by mouth daily.    [provider]  loperamide (IMODIUM A-D) 2 MG tablet Take 1 tablet (2 mg total) by mouth 4 (four) times daily as needed for diarrhea or loose stools.  Patient not taking:  Reported on 12/27/2019 04/19/19   [name], MD  naproxen (NAPROSYN) 500 MG tablet Take 1 tablet (500 mg total) by mouth 2 (two) times daily with a meal.  Patient not taking: Reported on 12/27/2019 10/10/19   [name], MD  ondansetron (ZOFRAN) 4 MG tablet Take 1 tablet (4 mg total) by mouth every 6 (six) hours as needed for nausea.  Patient not taking: Reported on 12/27/2019 04/03/19   [name], MD  pantoprazole (PROTONIX) 40 MG tablet Take 1 tablet (40 mg total) by mouth 2 (two) times daily before a meal.  Patient not taking: Reported on 12/27/2019 04/03/19   [name], MD  Physical Exam:       Vitals:   12/27/19 1026 12/27/19 1028 12/27/19 1048  BP:  (!) 89/64   Pulse:  82   Resp:  18   Temp:   (!) 94.2 F (34.6 C)  TempSrc:   Rectal  SpO2:  95%   Weight: 45.4 kg    Height: 5\' 6"  (1.676 m)          Vitals:   12/27/19 1026 12/27/19 1028 12/27/19 1048  BP:  (!) 89/64   Pulse:  82   Resp:  18   Temp:   (!) 94.2 F (34.6 C)  TempSrc:   Rectal  SpO2:  95%   Weight: 45.4 kg    Height: 5\' 6"  (1.676 m)     Constitutional: NAD, alert and oriented x 3. Frail,Thin. Eyes: PERRL, lids and conjunctivae normal.  ENMT: Mucous membranes dry. Neck: normal, supple, no masses, no thyromegaly.  Respiratory: Mild inspiratory wheezing resident all throughout lung field posteriorly  cardiovascular: Regular rate and rhythm, no murmurs / rubs / gallops. No extremity edema. 2+ pedal pulses. No carotid bruits.  Abdomen: no tenderness, no masses palpated. No hepatosplenomegaly. Bowel sounds positive.  Musculoskeletal: no clubbing / cyanosis.  Skin: no rashes, skin tears to both elbows and wrists of upper extremities, ulcers.  Neurologic: No gross focal neurologic deficit. Generalized weakness  Psychiatric: appropriate   Lab Results:  Basic Metabolic Panel:  Last Labs                Recent Labs  Lab 12/27/19  1036 12/27/19  1559 12/27/19  2049 12/27/19   2049 12/28/19  0159 12/28/19  0159 12/28/19  0534 12/28/19  1236 12/28/19  1822 12/29/19  0012 12/29/19  0829  NA 133* < > 134* --  135 --  133* --  132* --  133*  K 3.9 < > 4.6 --  3.4* --  3.3* --  3.9 --  3.7  CL 93* < > 104 --  104 --  106 --  106 --  105  CO2 15* < > 19* --  21* --  19* --  21* --  21*  GLUCOSE 408* < > 201* --  208* --  142* --  245* --  221*  BUN 68* < > 53* --  49* --  45* --  37* --  27*  CREATININE 3.15* < > 2.24* --  1.91* --  1.66* --  1.46* --  1.23  CALCIUM 8.5* < > 8.1* < > 8.1* < > 7.8* --  8.1* --  8.0*  MG 2.8* --  --  --  --  --  --  2.0 2.0 1.9 1.9  PHOS 5.3* --  --  --  --  --  --  1.8* 2.2* 1.7* 2.9  < > = values in this interval not displayed.  Liver Function Tests:  Last Labs        Recent Labs  Lab 12/27/19  1036 12/28/19  1822 12/29/19  0829  AST 12* 11* 10*  ALT 10 7 7   ALKPHOS 75 66 59  BILITOT 2.0* 1.1 0.6  PROT 7.0 6.0* 5.4*  ALBUMIN 3.2* 2.7* 2.3*   Last Labs       Recent Labs  Lab 12/27/19  1036 12/29/19  0012  LIPASE 39 22   Last Labs   No results for input(s): AMMONIA in the last 168 hours.  CBC:  Last Labs         Recent Labs  Lab 12/27/19  1036 12/28/19  0542 12/28/19  1822 12/29/19  0829  WBC 10.9* 9.1 12.4* 11.4*  NEUTROABS 9.7* --  11.4* 10.1*  HGB 10.2* 8.1* 8.8* 8.3*  HCT 31.5* 24.1* 26.8* 24.3*  MCV 94.9 92.3 93.4 91.4  PLT 245 173 194 166    Radiological Exams on Admission:  Imaging Results (Last 48 hours)  DG Chest Portable 1 View  Result Date: 12/27/2019  CLINICAL DATA: Weakness EXAM: PORTABLE CHEST 1 VIEW COMPARISON: Chest radiographs, 11/18/2019, CT chest, 02/13/2019 FINDINGS: The heart size and mediastinal contours are within normal limits. Unchanged nodule of the peripheral right upper lobe. The visualized skeletal structures are unremarkable. IMPRESSION: No acute abnormality of the lungs in AP portable projection. Unchanged nodule of the peripheral right upper lobe, better evaluated by  prior CT. Electronically Signed By: Eddie Candle M.D. On: 12/27/2019 10:47   EKG: Independently reviewed.  Sinus rhythm  Premature ventricular complexes   Assessment/Plan   Odynophagia: Barium swallow. Liquid diet  History of TB/right upper lobe nodule: Repeat CT shows multilobar pneumonia bilaterally, with concern for atypical bacterial including mycobacterial TB active infection.  Due to elevation of D-dimer will also consider CTA of the chest tomorrow to rule out any VTE's now that patient's AKI has resolved.  Diabetes mellitus type II: Continue with sliding scale insulin, Accu-Cheks, hypoglycemia protocol. Lantus started at 10 units at bedtime.  We will titrate dose and increase To twice daily dosing as  patient's hemoglobin A1c is 15. Home medications with long-acting insulin held.  Acute kidney injury resolved  Depression  Patient verbalized not wanting to live and had been starving himself  Has no active suicidal ideation at this time  Will consult psychaitry   Protein calorie malnutrition (Severe)  Will consult dietician  Patient will benefit from nutritional supplements   Anemia of chronic disease  Stable .  C.Diff Diarrhea: We will start pt on po vancomycin.   Poor living condition  Patient was referred to the ER by APS because of poor living conditions  Patient may need alternative living conditions upon discharge  DVT prophylaxis: Heparin  Code Status: Full code  Family Communication: Plan of care was discussed with patient at the bedside. He verbalizes understanding and agrees with the plan  Disposition Plan: Will need placement upon discharge  Consults called:  Case management, Psychaitry

## 2019-12-29 NOTE — Progress Notes (Signed)
OT Cancellation Note  Patient Details Name: Gregory Moody MRN: [id number] DOB: 07-30-1976   Cancelled Treatment:    Reason Eval/Treat Not Completed: Patient declined, no reason specified OT consult received and chart reviewed. Pt resting upon arrival, OT attempted to engage pt in evaluation and pt refusing to answer questions or participate in ADL assessment this date. Pt stated he has "already worked" this AM and OT offered to return tomorrow morning for evaluation. OT will follow up as able.  Dessie Coma, M.S. OTR/L  12/29/19, 3:45 PM

## 2019-12-29 NOTE — Evaluation (Signed)
Physical Therapy Evaluation Patient Details Name: Neo Yepiz MRN: [id number] DOB: 1976/01/22 Today's Date: 12/29/2019   History of Present Illness   44 y.o. male with medical history significant for diabetes mellitus with complications of gastroparesis and chronic kidney disease who was brought into the emergency room for evaluation of generalized weakness. Per EMS, police were called to patient's home to serve a warrant on another resident. When they noted the condition the patient was living in, APS was contacted and they recommended the patient be brought to the ER for further evaluation. Patient states that he has not had anything to eat for at least 5 days prior to coming in and complained of nausea and vomiting.  Clinical Impression  Pt did well with PT exam and was eager and willing to participate.  He showed good mobility getting to EOB and then to standing.  He showed general weakness in b/l U&LEs and does have some wounds, but given his recent history and lack of mobility he did surprisingly well.  He showed some balance concerns with testing, but generally was functional and managed in-room ambulation (with frequent speed and direction changes) w/o AD well with no LOBs or overt safety issues.  His vitals remained stable with the effort and though he is not at his baseline from a few weeks ago reports that that is as much as he has been up and doing in more than a week.    Follow Up Recommendations Home health PT;Supervision - Intermittent    Equipment Recommendations  None recommended by PT    Recommendations for Other Services       Precautions / Restrictions Precautions Precautions: Fall Restrictions Weight Bearing Restrictions: No      Mobility  Bed Mobility Overal bed mobility: Independent             General bed mobility comments: Pt was able to transition to EOB w/o assist or hesitation  Transfers Overall transfer level: Modified independent                General transfer comment: Pt able to rise to standing with increased UE use but no need for physical assist  Ambulation/Gait Ambulation/Gait assistance: Supervision Gait Distance (Feet): 140 Feet Assistive device: None       General Gait Details: Pt with mild lateral lean with each step, but able to maintain cadence and balance with multiple in-room turns.  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                             Pertinent Vitals/Pain Pain Assessment: (c/o mild pain in back, b/l elbow wounds)    Home Living Family/patient expects to be discharged to:: Private residence Living Arrangements: Parent Available Help at Discharge: Family;Available PRN/intermittently Type of Home: Mobile home Home Access: Stairs to enter Entrance Stairs-Rails: Right;Left Entrance Stairs-Number of  Steps: 4 Home Layout: One level Home Equipment: Walker - 2 wheels      Prior Function Level of Independence: Independent         Comments: does have a walker that he has had to use in the past     Hand Dominance        Extremity/Trunk Assessment   Upper Extremity Assessment Upper Extremity Assessment: Generalized weakness    Lower Extremity Assessment Lower Extremity Assessment: Generalized weakness       Communication   Communication: No difficulties  Cognition  Arousal/Alertness: Awake/alert Behavior During Therapy: WFL for tasks assessed/performed Overall Cognitive Status: Within Functional Limits for tasks assessed                                        General Comments      Exercises     Assessment/Plan    PT Assessment Patient needs continued PT services  PT Problem List Decreased strength;Decreased range of motion;Decreased activity tolerance;Decreased balance;Decreased mobility;Decreased knowledge of use of DME;Decreased safety awareness       PT Treatment Interventions DME instruction;Gait training;Stair training;Functional mobility training;Therapeutic activities;Therapeutic exercise;Balance training;Neuromuscular re-education;Patient/family education    PT Goals (Current goals can be found in the Care Plan section)  Acute Rehab PT Goals Patient Stated Goal: pt wants to go home PT Goal Formulation: With patient Time For Goal Achievement: 01/12/20 Potential to Achieve Goals: Good    Frequency Min 2X/week   Barriers to discharge        Co-evaluation               AM-PAC PT "6 Clicks" Mobility  Outcome Measure Help needed turning from your back to your side while in a flat bed without using bedrails?: None Help needed moving from lying on your back to sitting on the side of a flat bed without using bedrails?: None Help needed moving to and from a bed to a chair (including a wheelchair)?: None Help needed standing up from a chair using your arms (e.g., wheelchair or bedside chair)?: None Help needed to walk in hospital room?: A Little Help needed climbing 3-5 steps with a railing? : A Little 6 Click Score: 22    End of Session Equipment Utilized During Treatment: Gait belt Activity Tolerance: Patient tolerated treatment well Patient left: with chair alarm set;with call bell/phone within reach Nurse Communication: Mobility status PT Visit Diagnosis: Unsteadiness on feet (R26.81);Muscle weakness  (generalized) (M62.81)    Time: 3267-1245 PT Time Calculation (min) (ACUTE ONLY): 30 min   Charges:   PT Evaluation $PT Eval Low Complexity: 1 Low PT Treatments $Gait Training: 8-22 mins        Kreg Shropshire, DPT 12/29/2019, 12:03 PM

## 2019-12-29 NOTE — Progress Notes (Signed)
Inpatient Diabetes Program Recommendations  AACE/ADA: New Consensus Statement on Inpatient Glycemic Control (2015)  Target Ranges:  Prepandial:   less than 140 mg/dL      Peak postprandial:   less than 180 mg/dL (1-2 hours)      Critically ill patients:  140 - 180 mg/dL   Lab Results  Component Value Date   GLUCAP 138 (H) 12/28/2019   HGBA1C 15.0 (H) 12/29/2019    Review of Glycemic Control Results for [name], Gregory Moody (MRN [id number]) as of 12/29/2019 10:44  Ref. Range 12/28/2019 12:02 12/28/2019 12:40 12/28/2019 17:35 12/28/2019 22:17  Glucose-Capillary Latest Ref Range: 70 - 99 mg/dL 49 (L) 212 (H) 263 (H) 138 (H)   Diabetes history: DM1 Outpatient Diabetes medications: 70/30 25 units QAM, 70/30 35 units QPM Current orders for Inpatient glycemic control: Lantus 10 units QHS, Novolog 0-9 units TID with meals, Novolog 0-5 units QHS, Novolog 3 units TID with meals for meal coverage  Inpatient Diabetes Program Recommendations:    Consider increasing Lantus to 12 units QHS.   Thanks, Bronson Curb, MSN, RNC-OB Diabetes Coordinator [phone] (8a-5p)

## 2019-12-29 NOTE — Progress Notes (Signed)
Have asked pt to void multiple times this shift he states he will "before breakfast". He states he does not have to go that bad. Will pass on tho next shift

## 2019-12-29 NOTE — Progress Notes (Signed)
Subjective: Patient improved today.  Does not appear to have incontinence currently.  Ambulated to the bathroom without need for an assistive device.    Objective: Current vital signs: BP 126/90 (BP Location: Left Arm)   Pulse 95   Temp 98.9 F (37.2 C) (Oral)   Resp 18   Ht 5\' 7"  (1.702 m)   Wt 50.1 kg   SpO2 100%   BMI 17.30 kg/m  Vital signs in last 24 hours: Temp:  [98 F (36.7 C)-98.9 F (37.2 C)] 98.9 F (37.2 C) (03/20 0937) Pulse Rate:  [74-95] 95 (03/20 0937) Resp:  [17-18] 18 (03/20 0937) BP: (92-126)/(61-90) 126/90 (03/20 0937) SpO2:  [100 %] 100 % (03/20 0937)  Intake/Output from previous day: 03/19 0701 - 03/20 0700 In: -  Out: 1350 [Urine:1350] Intake/Output this shift: No intake/output data recorded. Nutritional status:  Diet Order            Diet Carb Modified Fluid consistency: Thin; Room service appropriate? Yes with Assist  Diet effective now              Neurologic Exam: Mental Status: Alert, thought content appropriate.  Speech fluent without evidence of aphasia.  Able to follow 3 step commands without difficulty. Cranial Nerves: II: Visual fields grossly normal, pupils equal, round, reactive to light and accommodation III,IV, VI: ptosis not present, extra-ocular motions intact bilaterally V,VII: smile symmetric, facial light touch sensation normal bilaterally VIII: hearing normal bilaterally IX,X: gag reflex present XI: bilateral shoulder shrug XII: midline tongue extension Motor: Generalized weakness noted.  Able to lift all extremities against gravity and maintain.  Approximated 5-/5 weakness in the upper extremities and 4/5 in the lower extremities.  Atrophy noted throughout Sensory: Pinprick and light touch intact throughout, bilaterally  Lab Results: Basic Metabolic Panel: Recent Labs  Lab 12/27/19 1036 12/27/19 1559 12/27/19 2049 12/27/19 2049 12/28/19 0159 12/28/19 0159 12/28/19 0534 12/28/19 1236 12/28/19 1822  12/29/19 0012 12/29/19 0829  NA 133*   < > 134*  --  135  --  133*  --  132*  --  133*  K 3.9   < > 4.6  --  3.4*  --  3.3*  --  3.9  --  3.7  CL 93*   < > 104  --  104  --  106  --  106  --  105  CO2 15*   < > 19*  --  21*  --  19*  --  21*  --  21*  GLUCOSE 408*   < > 201*  --  208*  --  142*  --  245*  --  221*  BUN 68*   < > 53*  --  49*  --  45*  --  37*  --  27*  CREATININE 3.15*   < > 2.24*  --  1.91*  --  1.66*  --  1.46*  --  1.23  CALCIUM 8.5*   < > 8.1*   < > 8.1*   < > 7.8*  --  8.1*  --  8.0*  MG 2.8*  --   --   --   --   --   --  2.0 2.0 1.9 1.9  PHOS 5.3*  --   --   --   --   --   --  1.8* 2.2* 1.7* 2.9   < > = values in this interval not displayed.    Liver Function Tests: Recent Labs  Lab  12/27/19 1036 12/28/19 1822 12/29/19 0829  AST 12* 11* 10*  ALT 10 7 7   ALKPHOS 75 66 59  BILITOT 2.0* 1.1 0.6  PROT 7.0 6.0* 5.4*  ALBUMIN 3.2* 2.7* 2.3*   Recent Labs  Lab 12/27/19 1036 12/29/19 0012  LIPASE 39 22   No results for input(s): AMMONIA in the last 168 hours.  CBC: Recent Labs  Lab 12/27/19 1036 12/28/19 0542 12/28/19 1822 12/29/19 0829  WBC 10.9* 9.1 12.4* 11.4*  NEUTROABS 9.7*  --  11.4* 10.1*  HGB 10.2* 8.1* 8.8* 8.3*  HCT 31.5* 24.1* 26.8* 24.3*  MCV 94.9 92.3 93.4 91.4  PLT 245 173 194 166    Cardiac Enzymes: No results for input(s): CKTOTAL, CKMB, CKMBINDEX, TROPONINI in the last 168 hours.  Lipid Panel: No results for input(s): CHOL, TRIG, HDL, CHOLHDL, VLDL, LDLCALC in the last 168 hours.  CBG: Recent Labs  Lab 12/28/19 1138 12/28/19 1202 12/28/19 1240 12/28/19 1735 12/28/19 2217  GLUCAP 51* 49* 212* 263* 138*    Microbiology: Results for orders placed or performed during the hospital encounter of 12/27/19  Culture, blood (routine x 2)     Status: None (Preliminary result)   Collection Time: 12/27/19 11:01 AM   Specimen: BLOOD  Result Value Ref Range Status   Specimen Description BLOOD LEFT [facility]  Final   Special Requests    Final    BOTTLES DRAWN AEROBIC AND ANAEROBIC Blood Culture adequate volume   Culture   Final    NO GROWTH 2 DAYS Performed at [facility], [address] [place], [place] [address]    Report Status PENDING  Incomplete  Culture, blood (routine x 2)     Status: None (Preliminary result)   Collection Time: 12/27/19 11:02 AM   Specimen: BLOOD  Result Value Ref Range Status   Specimen Description BLOOD LEFT [facility]ST  Final   Special Requests   Final    BOTTLES DRAWN AEROBIC AND ANAEROBIC Blood Culture adequate volume   Culture   Final    NO GROWTH 2 DAYS Performed at [name], [address], [place], [place] [address]    Report Status PENDING  Incomplete  SARS CORONAVIRUS 2 (TAT 6-24 HRS) Nasopharyngeal Nasopharyngeal Swab     Status: None   Collection Time: 12/27/19 11:59 AM   Specimen: Nasopharyngeal Swab  Result Value Ref Range Status   SARS Coronavirus 2 NEGATIVE NEGATIVE Final    Comment: (NOTE) SARS-CoV-2 target nucleic acids are NOT DETECTED. The SARS-CoV-2 RNA is generally detectable in upper and lower respiratory specimens during the acute phase of infection. Negative results do not preclude SARS-CoV-2 infection, do not rule out co-infections with other pathogens, and should not be used as the sole basis for treatment or other patient management decisions. Negative results must be combined with clinical observations, patient history, and epidemiological information. The expected result is Negative. Fact Sheet for Patients: SugarRoll.be Fact Sheet for Healthcare Providers: https://www.woods-mathews.com/ This test is not yet approved or cleared by the [place] FDA and  has been authorized for detection and/or diagnosis of SARS-CoV-2 by FDA under an Emergency Use Authorization (EUA). This EUA will remain  in effect (meaning this test can be used) for the duration of the COVID-19 declaration under Section  56 4(b)(1) of the Act, 21 U.S.C. section 360bbb-3(b)(1), unless the authorization is terminated or revoked sooner. Performed at [facility], [place] [address] [place], [place] [address]   MRSA PCR Screening  Status: None   Collection Time: 12/27/19  8:37 PM   Specimen: Nasopharyngeal  Result Value Ref Range Status   MRSA by PCR NEGATIVE NEGATIVE Final    Comment:        The GeneXpert MRSA Assay (FDA approved for NASAL specimens only), is one component of a comprehensive MRSA colonization surveillance program. It is not intended to diagnose MRSA infection nor to guide or monitor treatment for MRSA infections. Performed at [facility], [address] [place], [place] [address]   C difficile quick scan w PCR reflex     Status: Abnormal   Collection Time: 12/28/19  4:40 PM   Specimen: STOOL  Result Value Ref Range Status   C Diff antigen POSITIVE (A) NEGATIVE Final   C Diff toxin POSITIVE (A) NEGATIVE Final   C Diff interpretation Toxin producing C. difficile detected.  Final    Comment: CRITICAL RESULT CALLED TO, READ BACK BY AND VERIFIED WITH: SABRINA HESTER 12/28/19 @ 1749  [place] Performed at [facility], [address] [place], [place] [address]   [place] rt PCR [facility] only)     Status: None   Collection Time: 12/28/19  4:40 PM   Specimen: STOOL  Result Value Ref Range Status   Specimen source GC/Chlam URINE, RANDOM  Final   Chlamydia Tr NOT DETECTED NOT DETECTED Final   N gonorrhoeae NOT DETECTED NOT DETECTED Final    Comment: (NOTE) This CT/NG assay has not been evaluated in patients with a history of  hysterectomy. Performed at [facility], [address] [place], [place] [address]     Coagulation Studies: No results for input(s): LABPROT, INR in the last 72 hours.  Imaging: MR CERVICAL SPINE WO CONTRAST  Result Date: 12/28/2019 CLINICAL DATA:  Incontinence, bilateral leg pain and weakness EXAM: MRI CERVICAL,  THORACIC, AND LUMBAR SPINE WITHOUT CONTRAST TECHNIQUE: Multiplanar and multiecho pulse sequences of the cervical spine, to include the craniocervical junction and cervicothoracic junction, thoracic spine, and lumbar spine, were obtained without intravenous contrast. COMPARISON:  None. FINDINGS: MRI CERVICAL SPINE Alignment: Anteroposterior alignment is maintained. Vertebrae: Vertebral body heights are maintained. No marrow edema or suspicious osseous lesion. Cord: No abnormal signal. Posterior Fossa, vertebral arteries, paraspinal tissues: Unremarkable. Disc levels: C2-C3:  Facet hypertrophy.  No canal or foraminal stenosis. C3-C4: Disc bulge and facet hypertrophy. No canal or foraminal stenosis. C4-C5: Disc bulge and facet hypertrophy. No canal or foraminal stenosis. C5-C6: Disc bulge and facet and uncovertebral hypertrophy. No canal or right foraminal stenosis. Mild left foraminal stenosis. C6-C7:  Central disc protrusion.  No canal or foraminal stenosis. C7-T1:  No canal or foraminal stenosis. MRI THORACIC SPINE Alignment: Preserved kyphosis. Anteroposterior alignment is maintained. Vertebrae: Minor loss of height for example at T4-T6, T9, and T11. No suspicious osseous lesion. Cord: No abnormal signal. Posterior Fossa, vertebral arteries, paraspinal tissues: Left greater than right lung base atelectasis/consolidation. Question of a left lower lobe nodular opacity (series 22, image 15). Disc levels: Mild degenerative disc disease. For example, there are tiny protrusions at T2-T3, T3-T4, and T6-T7. No canal or foraminal stenosis. MRI LUMBAR SPINE Segmentation: Counting from above, there is transitional anatomy at the lumbosacral junction with 6 lumbar type vertebral bodies Alignment:  Anteroposterior alignment is maintained. Vertebrae: Vertebral body heights are preserved. Trace degenerate endplate marrow edema at L4. No suspicious osseous lesion. Conus medullaris and cauda equina: Conus extends to the L1-L2  level. Conus and cauda equina appear normal. Paraspinal and other soft tissues: Distended  bladder Disc levels: Disc desiccation at L5-L6. Small central protrusion and annular fissure and mild facet arthropathy. Disc desiccation at L6-S1. Left foraminal protrusion and annular fissure with endplate ridging. Facet arthropathy. No canal or right foraminal stenosis. Moderate left foraminal stenosis with disc/osteophyte contacting the exiting nerve root. IMPRESSION: No abnormal cord signal. No cord compression or significant canal stenosis. 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 Left foraminal stenosis at L6-S1. Distended bladder. Electronically Signed   By: Macy Mis M.D.   On: 12/28/2019 22:38   MR THORACIC SPINE WO CONTRAST  Result Date: 12/28/2019 CLINICAL DATA:  Incontinence, bilateral leg pain and weakness EXAM: MRI CERVICAL, THORACIC, AND LUMBAR SPINE WITHOUT CONTRAST TECHNIQUE: Multiplanar and multiecho pulse sequences of the cervical spine, to include the craniocervical junction and cervicothoracic junction, thoracic spine, and lumbar spine, were obtained without intravenous contrast. COMPARISON:  None. FINDINGS: MRI CERVICAL SPINE Alignment: Anteroposterior alignment is maintained. Vertebrae: Vertebral body heights are maintained. No marrow edema or suspicious osseous lesion. Cord: No abnormal signal. Posterior Fossa, vertebral arteries, paraspinal tissues: Unremarkable. Disc levels: C2-C3:  Facet hypertrophy.  No canal or foraminal stenosis. C3-C4: Disc bulge and facet hypertrophy. No canal or foraminal stenosis. C4-C5: Disc bulge and facet hypertrophy. No canal or foraminal stenosis. C5-C6: Disc bulge and facet and uncovertebral hypertrophy. No canal or right foraminal stenosis. Mild left foraminal stenosis. C6-C7:  Central disc protrusion.  No canal or foraminal stenosis. C7-T1:  No canal or foraminal stenosis. MRI THORACIC SPINE Alignment: Preserved  kyphosis. Anteroposterior alignment is maintained. Vertebrae: Minor loss of height for example at T4-T6, T9, and T11. No suspicious osseous lesion. Cord: No abnormal signal. Posterior Fossa, vertebral arteries, paraspinal tissues: Left greater than right lung base atelectasis/consolidation. Question of a left lower lobe nodular opacity (series 22, image 15). Disc levels: Mild degenerative disc disease. For example, there are tiny protrusions at T2-T3, T3-T4, and T6-T7. No canal or foraminal stenosis. MRI LUMBAR SPINE Segmentation: Counting from above, there is transitional anatomy at the lumbosacral junction with 6 lumbar type vertebral bodies Alignment:  Anteroposterior alignment is maintained. Vertebrae: Vertebral body heights are preserved. Trace degenerate endplate marrow edema at L4. No suspicious osseous lesion. Conus medullaris and cauda equina: Conus extends to the L1-L2 level. Conus and cauda equina appear normal. Paraspinal and other soft tissues: Distended bladder Disc levels: Disc desiccation at L5-L6. Small central protrusion and annular fissure and mild facet arthropathy. Disc desiccation at L6-S1. Left foraminal protrusion and annular fissure with endplate ridging. Facet arthropathy. No canal or right foraminal stenosis. Moderate left foraminal stenosis with disc/osteophyte contacting the exiting nerve root. IMPRESSION: No abnormal cord signal. No cord compression or significant canal stenosis. 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 Left foraminal stenosis at L6-S1. Distended bladder. Electronically Signed   By: Macy Mis M.D.   On: 12/28/2019 22:38   MR LUMBAR SPINE WO CONTRAST  Result Date: 12/28/2019 CLINICAL DATA:  Incontinence, bilateral leg pain and weakness EXAM: MRI CERVICAL, THORACIC, AND LUMBAR SPINE WITHOUT CONTRAST TECHNIQUE: Multiplanar and multiecho pulse sequences of the cervical spine, to include the craniocervical junction and  cervicothoracic junction, thoracic spine, and lumbar spine, were obtained without intravenous contrast. COMPARISON:  None. FINDINGS: MRI CERVICAL SPINE Alignment: Anteroposterior alignment is maintained. Vertebrae: Vertebral body heights are maintained. No marrow edema or suspicious osseous lesion. Cord: No abnormal signal. Posterior Fossa, vertebral arteries, paraspinal tissues: Unremarkable. Disc levels: C2-C3:  Facet hypertrophy.  No canal or  foraminal stenosis. C3-C4: Disc bulge and facet hypertrophy. No canal or foraminal stenosis. C4-C5: Disc bulge and facet hypertrophy. No canal or foraminal stenosis. C5-C6: Disc bulge and facet and uncovertebral hypertrophy. No canal or right foraminal stenosis. Mild left foraminal stenosis. C6-C7:  Central disc protrusion.  No canal or foraminal stenosis. C7-T1:  No canal or foraminal stenosis. MRI THORACIC SPINE Alignment: Preserved kyphosis. Anteroposterior alignment is maintained. Vertebrae: Minor loss of height for example at T4-T6, T9, and T11. No suspicious osseous lesion. Cord: No abnormal signal. Posterior Fossa, vertebral arteries, paraspinal tissues: Left greater than right lung base atelectasis/consolidation. Question of a left lower lobe nodular opacity (series 22, image 15). Disc levels: Mild degenerative disc disease. For example, there are tiny protrusions at T2-T3, T3-T4, and T6-T7. No canal or foraminal stenosis. MRI LUMBAR SPINE Segmentation: Counting from above, there is transitional anatomy at the lumbosacral junction with 6 lumbar type vertebral bodies Alignment:  Anteroposterior alignment is maintained. Vertebrae: Vertebral body heights are preserved. Trace degenerate endplate marrow edema at L4. No suspicious osseous lesion. Conus medullaris and cauda equina: Conus extends to the L1-L2 level. Conus and cauda equina appear normal. Paraspinal and other soft tissues: Distended bladder Disc levels: Disc desiccation at L5-L6. Small central protrusion and  annular fissure and mild facet arthropathy. Disc desiccation at L6-S1. Left foraminal protrusion and annular fissure with endplate ridging. Facet arthropathy. No canal or right foraminal stenosis. Moderate left foraminal stenosis with disc/osteophyte contacting the exiting nerve root. IMPRESSION: No abnormal cord signal. No cord compression or significant canal stenosis. 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 Left foraminal stenosis at L6-S1. Distended bladder. Electronically Signed   By: Macy Mis M.D.   On: 12/28/2019 22:38    Medications:  I have reviewed the patient's current medications. Scheduled: . sodium chloride   Intravenous Once  . aspirin EC  81 mg Oral Daily  . Chlorhexidine Gluconate Cloth  6 each Topical Daily  . heparin  5,000 Units Subcutaneous Q8H  . insulin aspart  0-5 Units Subcutaneous QHS  . insulin aspart  0-9 Units Subcutaneous TID WC  . insulin aspart  3 Units Subcutaneous TID WC  . insulin glargine  10 Units Subcutaneous QHS  . mouth rinse  15 mL Mouth Rinse BID  . multivitamin with minerals  1 tablet Oral Daily  . Ensure Max Protein  11 oz Oral BID BM  . vancomycin  125 mg Oral QID    Assessment/Plan: 44 year old male with a history of DM and CKD presenting after being noted by police to be poorly cared for.  Patient is a poor historian but appears chronically ill and as if he has not taken care of himself for quite some time. Reports LE weakness and incontinence of bowel and bladder and although he initially reports this has only been for 5 days I suspect his issues have been progressive over a much longer period of time.  Patient in DKA, with worsening CKD and hyponatremia.  MRI of the cervical, thoracic and lumbar spines personally reviewed and show no cord abnormalities.  Symptoms likely a consequence of his poorly controlled DM (A1c 15.0) and debility/malnutrition.  Patient not incontinent today per nursing.   Able to ambulate as well.    B12. Folate, TSH are normal.  B1 pending    Recommendations: 1. Patient improving.  No evidence of cord abnormalities.  No further neurologic intervention is recommended at this time.  If further questions arise,  please call or page at that time.  Thank you for allowing neurology to participate in the care of this patient.  Patient to continue therapy.    LOS: 2 days   Alexis Goodell, MD Neurology [phone] 12/29/2019  11:20 AM

## 2019-12-29 NOTE — Consult Note (Signed)
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 hispanic male with a history of DM, ([place]) , DKA, gastroparesis treated TB in 2017 for 6 months  Presents with  midsternal CP and SOB of sudden onset. Pt says he was eating fish on Monday  and was eating fast and a lot and felt that it was stuck in his mid chest and he was having pain. As he was not able to get rid of it he came to the ED after 24 hrs.  He denied any cough, vomiting, fever or chills. No night sweats. He had similar presentation to the hospital twice this year in March and April and during those times he was also hypotensive and was in DKA, ARF. He underwent upper Gi endoscopy on 4/25 and found to have erosive esophagitis and hiatus hernia.  In 2017 pt was admitted to [facility] between 8/9/-05/21/16 for abnormal CXR and suspicion for TB and  underwent bronchoscopy on 8/10.  and BAL  culture positive for MTB .HE was treated by [facility] with RIPE and completed 6 months of treatment. 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 He had coloscopy/endoscopy, HIV, HTLV1 were all neg- cortisol level was okay. It was thought to be due to DM. Pt since then has gained 13 Kgs ( on chart examination)  Pt now denies any cough, fever, night sweats, or diarrhea.    PAST MEDICAL HISTORY   Past Medical History:  Diagnosis Date   COVID-19    Diabetes mellitus without complication ([place])    Gastroparesis    Tuberculosis      SURGICAL HISTORY   Past Surgical History:  Procedure Laterality Date    ESOPHAGOGASTRODUODENOSCOPY N/A 02/03/2019   Procedure: ESOPHAGOGASTRODUODENOSCOPY (EGD);  Surgeon: Toledo, Benay Pike, MD;  Location: [facility] ENDOSCOPY;  Service: Gastroenterology;  Laterality: N/A;     FAMILY HISTORY   Family History  Problem Relation Age of Onset   Diabetes Mother    Diabetes Father      SOCIAL HISTORY   Social History   Tobacco Use   Smoking status: Current Some Day Smoker   Smokeless tobacco: Never Used  Substance Use Topics   Alcohol use: No   Drug use: No     MEDICATIONS    Home Medication:    Current Medication:  Current Facility-Administered Medications:    0.9 %  sodium chloride infusion (Manually program via Guardrails IV Fluids), , Intravenous, Once, Ouma, Bing Neighbors, NP, Stopped at 12/28/19 0731   0.9 %  sodium chloride infusion, , Intravenous, Continuous, Ouma, Bing Neighbors, NP, Stopped at 12/29/19 1402   aspirin EC tablet 81 mg, 81 mg, Oral, Daily, Agbata, Tochukwu, MD, 81 mg at 12/29/19 1226   azithromycin (ZITHROMAX) 500 mg in sodium chloride 0.9 % 250 mL IVPB, 500 mg, Intravenous, Q24H, Patel, Ekta V, MD   cefTRIAXone (ROCEPHIN) 1 g in sodium chloride 0.9 % 100 mL IVPB, 1 g,  Intravenous, Q24H, Para Skeans, MD   Chlorhexidine Gluconate Cloth 2 % PADS 6 each, 6 each, Topical, Daily, Agbata, Tochukwu, MD, 6 each at 12/29/19 1227   dextrose 50 % solution 0-50 mL, 0-50 mL, Intravenous, PRN, Blake Divine, MD, 50 mL at 12/28/19 1211   heparin injection 5,000 Units, 5,000 Units, Subcutaneous, Q8H, Agbata, Tochukwu, MD, 5,000 Units at 12/29/19 0551   insulin aspart (novoLOG) injection 0-5 Units, 0-5 Units, Subcutaneous, QHS, Ouma, Bing Neighbors, NP   insulin aspart (novoLOG) injection 0-9 Units, 0-9 Units, Subcutaneous, TID WC, Ouma, Bing Neighbors, NP, 5 Units at 12/29/19 1224   insulin aspart (novoLOG) injection 3 Units, 3 Units, Subcutaneous, TID WC, Ouma, Bing Neighbors, NP, 3 Units at 12/29/19 1225    insulin glargine (LANTUS) injection 10 Units, 10 Units, Subcutaneous, QHS, Ouma, Bing Neighbors, NP, 10 Units at 12/28/19 2223   magic mouthwash, 5 mL, Oral, TID PRN, 5 mL at 12/29/19 1232 **AND** lidocaine (XYLOCAINE) 2 % viscous mouth solution 5 mL, 5 mL, Mouth/Throat, TID PRN, Hall, Scott A, RPH, 5 mL at 12/29/19 1232   MEDLINE mouth rinse, 15 mL, Mouth Rinse, BID, Agbata, Tochukwu, MD, 15 mL at 12/28/19 2219   multivitamin with minerals tablet 1 tablet, 1 tablet, Oral, Daily, Florina Ou V, MD, 1 tablet at 12/29/19 1226   ondansetron (ZOFRAN) injection 4 mg, 4 mg, Intravenous, Q6H PRN, Lang Snow, NP, 4 mg at 12/28/19 2330   pantoprazole (PROTONIX) injection 40 mg, 40 mg, Intravenous, Q12H, Patel, Ekta V, MD   protein supplement (ENSURE MAX) liquid, 11 oz, Oral, BID BM, Florina Ou V, MD, 11 oz at 12/29/19 1227   sucralfate (CARAFATE) tablet 1 g, 1 g, Oral, TID WC & HS, Para Skeans, MD   vancomycin (VANCOCIN) 50 mg/mL oral solution 125 mg, 125 mg, Oral, QID, Florina Ou V, MD, 125 mg at 12/29/19 1225    ALLERGIES   Patient has no known allergies.     REVIEW OF SYSTEMS    Review of Systems:  Gen:  Denies  fever, sweats, chills weigh loss  HEENT: Denies blurred vision, double vision, ear pain, eye pain, hearing loss, nose bleeds, sore throat Cardiac:  No dizziness, chest pain or heaviness, chest tightness,edema Resp:   Denies cough or sputum porduction, shortness of breath,wheezing, hemoptysis,  Gi: Denies swallowing difficulty, stomach pain, nausea or vomiting, diarrhea, constipation, bowel incontinence Gu:  Denies bladder incontinence, burning urine Ext:   Denies Joint pain, stiffness or swelling Skin: Denies  skin rash, easy bruising or bleeding or hives Endoc:  Denies polyuria, polydipsia , polyphagia or weight change Psych:   Denies depression, insomnia or hallucinations   Other:  All other systems negative   VS: BP (!) 126/91 (BP Location: Right  Arm)    Pulse 87    Temp 98.4 F (36.9 C) (Oral)    Resp 18    Ht 5\' 7"  (1.702 m)    Wt 50.1 kg    SpO2 100%    BMI 17.30 kg/m      PHYSICAL EXAM    GENERAL:NAD, no fevers, chills, no weakness no fatigue HEAD: Normocephalic, atraumatic.  EYES: Pupils equal, round, reactive to light. Extraocular muscles intact. No scleral icterus.  MOUTH: Moist mucosal membrane. Dentition intact. No abscess noted.  EAR, NOSE, THROAT: Clear without exudates. No external lesions.  NECK: Supple. No thyromegaly. No nodules. No JVD.  PULMONARY: Diffuse coarse rhonchi right sided +wheezes CARDIOVASCULAR: S1 and S2. Regular rate and rhythm. No murmurs,  rubs, or gallops. No edema. Pedal pulses 2+ bilaterally.  GASTROINTESTINAL: Soft, nontender, nondistended. No masses. Positive bowel sounds. No hepatosplenomegaly.  MUSCULOSKELETAL: No swelling, clubbing, or edema. Range of motion full in all extremities.  NEUROLOGIC: Cranial nerves II through XII are intact. No gross focal neurological deficits. Sensation intact. Reflexes intact.  SKIN: No ulceration, lesions, rashes, or cyanosis. Skin warm and dry. Turgor intact.  PSYCHIATRIC: Mood, affect within normal limits. The patient is awake, alert and oriented x 3. Insight, judgment intact.       IMAGING    CT CHEST WO CONTRAST  Result Date: 12/29/2019 CLINICAL DATA:  44 year old male with history of pulmonary nodule. Positive TB gold test 02/2019. EXAM: CT CHEST WITHOUT CONTRAST TECHNIQUE: Multidetector CT imaging of the chest was performed following the standard protocol without IV contrast. COMPARISON:  Chest CT 02/13/2019. FINDINGS: Cardiovascular: Heart size is normal. There is no significant pericardial fluid, thickening or pericardial calcification. There is aortic atherosclerosis, as well as atherosclerosis of the great vessels of the mediastinum and the coronary arteries, including calcified atherosclerotic plaque in the left anterior descending coronary  artery. Mediastinum/Nodes: No pathologically enlarged mediastinal or hilar lymph nodes. Please note that accurate exclusion of hilar adenopathy is limited on noncontrast CT scans. Densely calcified right paratracheal lymph nodes are incidentally noted. Esophagus is unremarkable in appearance. No axillary lymphadenopathy. Lungs/Pleura: Again noted is multifocal nodularity in the lungs, with the largest single nodular density in the periphery of the right upper lobe (axial image 48 of series 3) measuring 3.9 x 2.9 cm on today's examination. Scattered areas of cylindrical bronchiectasis with thickening of the peribronchovascular interstitium, regional architectural distortion and volume loss and extensive peribronchovascular micro and macronodularity, markedly increased compared to the prior examination, particularly in the lower lobes of the lungs bilaterally. New areas of airspace consolidation, most confluent in the superior segment of the left lower lobe and in the inferior segment of the lingula, as well as in a patchy peribronchovascular distribution in the posterior aspect of the left upper lobe, concerning for active infection. No pleural effusions. Upper Abdomen: Small volume of ascites. Musculoskeletal: There are no aggressive appearing lytic or blastic lesions noted in the visualized portions of the skeleton. IMPRESSION: 1. There is evidence of active multilobar pneumonia in the lungs bilaterally, as above. The appearance is nonspecific and could include both typical or atypical organisms, including mycobacterium tuberculosis. Further clinical evaluation is strongly recommended. 2. Previously noted nodular density in the periphery of the right upper lobe is very similar to the prior examination, likely to represent an area of chronic post infectious or inflammatory scarring. 3. Aortic atherosclerosis, in addition to left anterior descending coronary artery disease. Please note that although the presence of  coronary artery calcium documents the presence of coronary artery disease, the severity of this disease and any potential stenosis cannot be assessed on this non-gated CT examination. Assessment for potential risk factor modification, dietary therapy or pharmacologic therapy may be warranted, if clinically indicated. These results will be called to the ordering clinician or representative by the Radiologist Assistant, and communication documented in the PACS or [employer]. Aortic Atherosclerosis (ICD10-I70.0). Electronically Signed   By: Vinnie Langton M.D.   On: 12/29/2019 12:08   MR CERVICAL SPINE WO CONTRAST  Result Date: 12/28/2019 CLINICAL DATA:  Incontinence, bilateral leg pain and weakness EXAM: MRI CERVICAL, THORACIC, AND LUMBAR SPINE WITHOUT CONTRAST TECHNIQUE: Multiplanar and multiecho pulse sequences of the cervical spine, to include the craniocervical junction and cervicothoracic junction,  thoracic spine, and lumbar spine, were obtained without intravenous contrast. COMPARISON:  None. FINDINGS: MRI CERVICAL SPINE Alignment: Anteroposterior alignment is maintained. Vertebrae: Vertebral body heights are maintained. No marrow edema or suspicious osseous lesion. Cord: No abnormal signal. Posterior Fossa, vertebral arteries, paraspinal tissues: Unremarkable. Disc levels: C2-C3:  Facet hypertrophy.  No canal or foraminal stenosis. C3-C4: Disc bulge and facet hypertrophy. No canal or foraminal stenosis. C4-C5: Disc bulge and facet hypertrophy. No canal or foraminal stenosis. C5-C6: Disc bulge and facet and uncovertebral hypertrophy. No canal or right foraminal stenosis. Mild left foraminal stenosis. C6-C7:  Central disc protrusion.  No canal or foraminal stenosis. C7-T1:  No canal or foraminal stenosis. MRI THORACIC SPINE Alignment: Preserved kyphosis. Anteroposterior alignment is maintained. Vertebrae: Minor loss of height for example at T4-T6, T9, and T11. No suspicious osseous lesion. Cord: No  abnormal signal. Posterior Fossa, vertebral arteries, paraspinal tissues: Left greater than right lung base atelectasis/consolidation. Question of a left lower lobe nodular opacity (series 22, image 15). Disc levels: Mild degenerative disc disease. For example, there are tiny protrusions at T2-T3, T3-T4, and T6-T7. No canal or foraminal stenosis. MRI LUMBAR SPINE Segmentation: Counting from above, there is transitional anatomy at the lumbosacral junction with 6 lumbar type vertebral bodies Alignment:  Anteroposterior alignment is maintained. Vertebrae: Vertebral body heights are preserved. Trace degenerate endplate marrow edema at L4. No suspicious osseous lesion. Conus medullaris and cauda equina: Conus extends to the L1-L2 level. Conus and cauda equina appear normal. Paraspinal and other soft tissues: Distended bladder Disc levels: Disc desiccation at L5-L6. Small central protrusion and annular fissure and mild facet arthropathy. Disc desiccation at L6-S1. Left foraminal protrusion and annular fissure with endplate ridging. Facet arthropathy. No canal or right foraminal stenosis. Moderate left foraminal stenosis with disc/osteophyte contacting the exiting nerve root. IMPRESSION: No abnormal cord signal. No cord compression or significant canal stenosis. 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 Left foraminal stenosis at L6-S1. Distended bladder. Electronically Signed   By: Macy Mis M.D.   On: 12/28/2019 22:38   MR THORACIC SPINE WO CONTRAST  Result Date: 12/28/2019 CLINICAL DATA:  Incontinence, bilateral leg pain and weakness EXAM: MRI CERVICAL, THORACIC, AND LUMBAR SPINE WITHOUT CONTRAST TECHNIQUE: Multiplanar and multiecho pulse sequences of the cervical spine, to include the craniocervical junction and cervicothoracic junction, thoracic spine, and lumbar spine, were obtained without intravenous contrast. COMPARISON:  None. FINDINGS: MRI CERVICAL SPINE  Alignment: Anteroposterior alignment is maintained. Vertebrae: Vertebral body heights are maintained. No marrow edema or suspicious osseous lesion. Cord: No abnormal signal. Posterior Fossa, vertebral arteries, paraspinal tissues: Unremarkable. Disc levels: C2-C3:  Facet hypertrophy.  No canal or foraminal stenosis. C3-C4: Disc bulge and facet hypertrophy. No canal or foraminal stenosis. C4-C5: Disc bulge and facet hypertrophy. No canal or foraminal stenosis. C5-C6: Disc bulge and facet and uncovertebral hypertrophy. No canal or right foraminal stenosis. Mild left foraminal stenosis. C6-C7:  Central disc protrusion.  No canal or foraminal stenosis. C7-T1:  No canal or foraminal stenosis. MRI THORACIC SPINE Alignment: Preserved kyphosis. Anteroposterior alignment is maintained. Vertebrae: Minor loss of height for example at T4-T6, T9, and T11. No suspicious osseous lesion. Cord: No abnormal signal. Posterior Fossa, vertebral arteries, paraspinal tissues: Left greater than right lung base atelectasis/consolidation. Question of a left lower lobe nodular opacity (series 22, image 15). Disc levels: Mild degenerative disc disease. For example, there are tiny protrusions at T2-T3, T3-T4, and T6-T7. No canal or foraminal stenosis. MRI LUMBAR SPINE Segmentation:  Counting from above, there is transitional anatomy at the lumbosacral junction with 6 lumbar type vertebral bodies Alignment:  Anteroposterior alignment is maintained. Vertebrae: Vertebral body heights are preserved. Trace degenerate endplate marrow edema at L4. No suspicious osseous lesion. Conus medullaris and cauda equina: Conus extends to the L1-L2 level. Conus and cauda equina appear normal. Paraspinal and other soft tissues: Distended bladder Disc levels: Disc desiccation at L5-L6. Small central protrusion and annular fissure and mild facet arthropathy. Disc desiccation at L6-S1. Left foraminal protrusion and annular fissure with endplate ridging. Facet  arthropathy. No canal or right foraminal stenosis. Moderate left foraminal stenosis with disc/osteophyte contacting the exiting nerve root. IMPRESSION: No abnormal cord signal. No cord compression or significant canal stenosis. 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 Left foraminal stenosis at L6-S1. Distended bladder. Electronically Signed   By: Macy Mis M.D.   On: 12/28/2019 22:38   MR LUMBAR SPINE WO CONTRAST  Result Date: 12/28/2019 CLINICAL DATA:  Incontinence, bilateral leg pain and weakness EXAM: MRI CERVICAL, THORACIC, AND LUMBAR SPINE WITHOUT CONTRAST TECHNIQUE: Multiplanar and multiecho pulse sequences of the cervical spine, to include the craniocervical junction and cervicothoracic junction, thoracic spine, and lumbar spine, were obtained without intravenous contrast. COMPARISON:  None. FINDINGS: MRI CERVICAL SPINE Alignment: Anteroposterior alignment is maintained. Vertebrae: Vertebral body heights are maintained. No marrow edema or suspicious osseous lesion. Cord: No abnormal signal. Posterior Fossa, vertebral arteries, paraspinal tissues: Unremarkable. Disc levels: C2-C3:  Facet hypertrophy.  No canal or foraminal stenosis. C3-C4: Disc bulge and facet hypertrophy. No canal or foraminal stenosis. C4-C5: Disc bulge and facet hypertrophy. No canal or foraminal stenosis. C5-C6: Disc bulge and facet and uncovertebral hypertrophy. No canal or right foraminal stenosis. Mild left foraminal stenosis. C6-C7:  Central disc protrusion.  No canal or foraminal stenosis. C7-T1:  No canal or foraminal stenosis. MRI THORACIC SPINE Alignment: Preserved kyphosis. Anteroposterior alignment is maintained. Vertebrae: Minor loss of height for example at T4-T6, T9, and T11. No suspicious osseous lesion. Cord: No abnormal signal. Posterior Fossa, vertebral arteries, paraspinal tissues: Left greater than right lung base atelectasis/consolidation. Question of a left lower  lobe nodular opacity (series 22, image 15). Disc levels: Mild degenerative disc disease. For example, there are tiny protrusions at T2-T3, T3-T4, and T6-T7. No canal or foraminal stenosis. MRI LUMBAR SPINE Segmentation: Counting from above, there is transitional anatomy at the lumbosacral junction with 6 lumbar type vertebral bodies Alignment:  Anteroposterior alignment is maintained. Vertebrae: Vertebral body heights are preserved. Trace degenerate endplate marrow edema at L4. No suspicious osseous lesion. Conus medullaris and cauda equina: Conus extends to the L1-L2 level. Conus and cauda equina appear normal. Paraspinal and other soft tissues: Distended bladder Disc levels: Disc desiccation at L5-L6. Small central protrusion and annular fissure and mild facet arthropathy. Disc desiccation at L6-S1. Left foraminal protrusion and annular fissure with endplate ridging. Facet arthropathy. No canal or right foraminal stenosis. Moderate left foraminal stenosis with disc/osteophyte contacting the exiting nerve root. IMPRESSION: No abnormal cord signal. No cord compression or significant canal stenosis. 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 Left foraminal stenosis at L6-S1. Distended bladder. Electronically Signed   By: Macy Mis M.D.   On: 12/28/2019 22:38   DG Chest Portable 1 View  Result Date: 12/27/2019 CLINICAL DATA:  Weakness EXAM: PORTABLE CHEST 1 VIEW COMPARISON:  Chest radiographs, 11/18/2019, CT chest, 02/13/2019 FINDINGS: The heart size and mediastinal contours are within normal limits. Unchanged  nodule of the peripheral right upper lobe. The visualized skeletal structures are unremarkable. IMPRESSION: No acute abnormality of the lungs in AP portable projection. Unchanged nodule of the peripheral right upper lobe, better evaluated by prior CT. Electronically Signed   By: Eddie Candle M.D.   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  He shares that he has set up charity care there and that is why he goes there.   -He states he feels better and his main problem now is diarreah and inability to eat due to gastritis which he states has been worked up before and was explain its from his DM and pancreatic insufficiency -He had Right upper lung lesion from several years ago - which when reviewed from previous appears stable.  There is also infiltrate on current ct chest at [facility] which appears to be new and I agree with CAP regimen.  He is on room air and states he can walk no problem .   - I would recommend to continue 7-10 day CAP regimen and return to his previous pulmonologist to evaluate any concern for potential mycobacterium avium related chronic lung disease. Patient is hemodynamically stable, without cough.      Thank you for allowing me to participate in the care of this patient.    Patient/Family are satisfied with care plan and all questions have been answered.  This document was prepared using Dragon voice recognition software and may include unintentional dictation errors.     Ottie Glazier, M.D.  Division of [facility]

## 2019-12-30 LAB — GLUCOSE, CAPILLARY
Glucose-Capillary: 175 mg/dL — ABNORMAL HIGH (ref 70–99)
Glucose-Capillary: 139 mg/dL — ABNORMAL HIGH (ref 70–99)
Glucose-Capillary: 161 mg/dL — ABNORMAL HIGH (ref 70–99)
Glucose-Capillary: 131 mg/dL — ABNORMAL HIGH (ref 70–99)
Glucose-Capillary: 165 mg/dL — ABNORMAL HIGH (ref 70–99)
Glucose-Capillary: 162 mg/dL — ABNORMAL HIGH (ref 70–99)

## 2019-12-30 LAB — CBC WITH DIFFERENTIAL/PLATELET
Abs Immature Granulocytes: 0.05 10*3/uL (ref 0.00–0.07)
Basophils Absolute: 0 10*3/uL (ref 0.0–0.1)
Basophils Relative: 0 %
Eosinophils Absolute: 0 10*3/uL (ref 0.0–0.5)
Eosinophils Relative: 0 %
HCT: 22.8 % — ABNORMAL LOW (ref 39.0–52.0)
Hemoglobin: 7.9 g/dL — ABNORMAL LOW (ref 13.0–17.0)
Immature Granulocytes: 1 %
Lymphocytes Relative: 14 %
Lymphs Abs: 1.4 10*3/uL (ref 0.7–4.0)
MCH: 31.9 pg (ref 26.0–34.0)
MCHC: 34.6 g/dL (ref 30.0–36.0)
MCV: 91.9 fL (ref 80.0–100.0)
Monocytes Absolute: 0.3 10*3/uL (ref 0.1–1.0)
Monocytes Relative: 3 %
Neutro Abs: 8.2 10*3/uL — ABNORMAL HIGH (ref 1.7–7.7)
Neutrophils Relative %: 82 %
Platelets: 156 10*3/uL (ref 150–400)
RBC: 2.48 MIL/uL — ABNORMAL LOW (ref 4.22–5.81)
RDW: 12.6 % (ref 11.5–15.5)
WBC: 10 10*3/uL (ref 4.0–10.5)
nRBC: 0 % (ref 0.0–0.2)

## 2019-12-30 LAB — COMPREHENSIVE METABOLIC PANEL
ALT: 5 U/L (ref 0–44)
AST: 11 U/L — ABNORMAL LOW (ref 15–41)
Albumin: 2 g/dL — ABNORMAL LOW (ref 3.5–5.0)
Alkaline Phosphatase: 59 U/L (ref 38–126)
Anion gap: 10 (ref 5–15)
BUN: 17 mg/dL (ref 6–20)
CO2: 23 mmol/L (ref 22–32)
Calcium: 7.7 mg/dL — ABNORMAL LOW (ref 8.9–10.3)
Chloride: 102 mmol/L (ref 98–111)
Creatinine, Ser: 1 mg/dL (ref 0.61–1.24)
GFR calc Af Amer: >60 mL/min (ref >60)
GFR calc non Af Amer: >60 mL/min (ref >60)
Glucose, Bld: 159 mg/dL — ABNORMAL HIGH (ref 70–99)
Potassium: 3.2 mmol/L — ABNORMAL LOW (ref 3.5–5.1)
Sodium: 135 mmol/L (ref 135–145)
Total Bilirubin: 0.7 mg/dL (ref 0.3–1.2)
Total Protein: 4.9 g/dL — ABNORMAL LOW (ref 6.5–8.1)

## 2019-12-30 LAB — URINE CULTURE: Culture: >=100000 — AB

## 2019-12-30 LAB — MAGNESIUM: Magnesium: 1.5 mg/dL — ABNORMAL LOW (ref 1.7–2.4)

## 2019-12-30 LAB — C-REACTIVE PROTEIN: CRP: 9.4 mg/dL — ABNORMAL HIGH (ref <1.0)

## 2019-12-30 LAB — PHOSPHORUS: Phosphorus: 2.1 mg/dL — ABNORMAL LOW (ref 2.5–4.6)

## 2019-12-30 MED ORDER — INSULIN ASPART 100 UNIT/ML ~~LOC~~ SOLN
2.0000 [IU] | Freq: Three times a day (TID) | SUBCUTANEOUS | Status: DC
  Administered 2019-12-30 – 2020-01-03 (×6): 2 [IU] via SUBCUTANEOUS
  Filled 2019-12-30 (×7): qty 1

## 2019-12-30 MED ORDER — SODIUM CHLORIDE 0.9% IV SOLUTION: Freq: Once | INTRAVENOUS | Status: AC

## 2019-12-30 MED ORDER — INSULIN ASPART 100 UNIT/ML ~~LOC~~ SOLN
0.0000 [IU] | Freq: Three times a day (TID) | SUBCUTANEOUS | Status: DC
  Administered 2019-12-30: 1 [IU] via SUBCUTANEOUS
  Administered 2019-12-31 (×2): 2 [IU] via SUBCUTANEOUS
  Administered 2020-01-03: 1 [IU] via SUBCUTANEOUS
  Filled 2019-12-30 (×6): qty 1

## 2019-12-30 MED ORDER — SODIUM CHLORIDE 0.9% IV SOLUTION: Freq: Once | INTRAVENOUS | Status: DC

## 2019-12-30 MED ORDER — POTASSIUM PHOSPHATES 15 MMOLE/5ML IV SOLN
15.0000 mmol | Freq: Once | INTRAVENOUS | Status: AC
  Administered 2019-12-30: 15 mmol via INTRAVENOUS
  Filled 2019-12-30: qty 5

## 2019-12-30 MED ORDER — MAGNESIUM SULFATE 2 GM/50ML IV SOLN
2.0000 g | Freq: Once | INTRAVENOUS | Status: AC
  Administered 2019-12-30: 2 g via INTRAVENOUS
  Filled 2019-12-30: qty 50

## 2019-12-30 NOTE — Plan of Care (Signed)
  Problem: Education: Goal: Ability to describe self-care measures that may prevent or decrease complications (Diabetes Survival Skills Education) will improve Outcome: Progressing Goal: Individualized Educational Video(s) Outcome: Progressing   Problem: Cardiac: Goal: Ability to maintain an adequate cardiac output will improve Outcome: Progressing   Problem: Health Behavior/Discharge Planning: Goal: Ability to identify and utilize available resources and services will improve Outcome: Progressing Goal: Ability to manage health-related needs will improve Outcome: Progressing   Problem: Fluid Volume: Goal: Ability to achieve a balanced intake and output will improve Outcome: Progressing   Problem: Metabolic: Goal: Ability to maintain appropriate glucose levels will improve Outcome: Progressing   Problem: Nutritional: Goal: Maintenance of adequate nutrition will improve Outcome: Progressing Goal: Maintenance of adequate weight for body size and type will improve Outcome: Progressing   Problem: Respiratory: Goal: Will regain and/or maintain adequate ventilation Outcome: Progressing   Problem: Urinary Elimination: Goal: Ability to achieve and maintain adequate renal perfusion and functioning will improve Outcome: Progressing   Problem: Education: Goal: Knowledge of General Education information will improve Description: Including pain rating scale, medication(s)/side effects and non-pharmacologic comfort measures Outcome: Progressing   Problem: Health Behavior/Discharge Planning: Goal: Ability to manage health-related needs will improve Outcome: Progressing   Problem: Clinical Measurements: Goal: Ability to maintain clinical measurements within normal limits will improve Outcome: Progressing Goal: Will remain free from infection Outcome: Progressing Goal: Diagnostic test results will improve Outcome: Progressing Goal: Respiratory complications will improve Outcome:  Progressing Goal: Cardiovascular complication will be avoided Outcome: Progressing   Problem: Activity: Goal: Risk for activity intolerance will decrease Outcome: Progressing   Problem: Nutrition: Goal: Adequate nutrition will be maintained Outcome: Progressing   Problem: Coping: Goal: Level of anxiety will decrease Outcome: Progressing   Problem: Elimination: Goal: Will not experience complications related to bowel motility Outcome: Progressing Goal: Will not experience complications related to urinary retention Outcome: Progressing   Problem: Pain Managment: Goal: General experience of comfort will improve Outcome: Progressing   Problem: Safety: Goal: Ability to remain free from injury will improve Outcome: Progressing   Problem: Skin Integrity: Goal: Risk for impaired skin integrity will decrease Outcome: Progressing

## 2019-12-30 NOTE — Plan of Care (Signed)
  Problem: Education: Goal: Ability to describe self-care measures that may prevent or decrease complications (Diabetes Survival Skills Education) will improve Outcome: Progressing Goal: Individualized Educational Video(s) Outcome: Progressing   Problem: Cardiac: Goal: Ability to maintain an adequate cardiac output will improve Outcome: Progressing   Problem: Health Behavior/Discharge Planning: Goal: Ability to identify and utilize available resources and services will improve Outcome: Progressing Goal: Ability to manage health-related needs will improve Outcome: Progressing   Problem: Metabolic: Goal: Ability to maintain appropriate glucose levels will improve Outcome: Progressing   Problem: Nutritional: Goal: Maintenance of adequate nutrition will improve Outcome: Progressing Goal: Maintenance of adequate weight for body size and type will improve Outcome: Progressing   Problem: Respiratory: Goal: Will regain and/or maintain adequate ventilation Outcome: Progressing   Problem: Urinary Elimination: Goal: Ability to achieve and maintain adequate renal perfusion and functioning will improve Outcome: Progressing   Problem: Education: Goal: Knowledge of General Education information will improve Description: Including pain rating scale, medication(s)/side effects and non-pharmacologic comfort measures Outcome: Progressing   Problem: Health Behavior/Discharge Planning: Goal: Ability to manage health-related needs will improve Outcome: Progressing   Problem: Clinical Measurements: Goal: Ability to maintain clinical measurements within normal limits will improve Outcome: Progressing Goal: Will remain free from infection Outcome: Progressing Goal: Diagnostic test results will improve Outcome: Progressing Goal: Respiratory complications will improve Outcome: Progressing Goal: Cardiovascular complication will be avoided Outcome: Progressing   Problem: Activity: Goal:  Risk for activity intolerance will decrease Outcome: Progressing   Problem: Nutrition: Goal: Adequate nutrition will be maintained Outcome: Progressing   Problem: Coping: Goal: Level of anxiety will decrease Outcome: Progressing   Problem: Elimination: Goal: Will not experience complications related to bowel motility Outcome: Progressing Goal: Will not experience complications related to urinary retention Outcome: Progressing   Problem: Pain Managment: Goal: General experience of comfort will improve Outcome: Progressing   Problem: Safety: Goal: Ability to remain free from injury will improve Outcome: Progressing   Problem: Skin Integrity: Goal: Risk for impaired skin integrity will decrease Outcome: Progressing

## 2019-12-30 NOTE — Progress Notes (Signed)
Blood consent obtained from patient. Utilized Veterinary surgeon for education. Patient verbalizes understanding.

## 2019-12-30 NOTE — Evaluation (Signed)
Occupational Therapy Evaluation Patient Details Name: Gregory Moody MRN: [id number] DOB: 1976-08-18 Today's Date: 12/30/2019    History of Present Illness  44 y.o. male with medical history significant for diabetes mellitus with complications of gastroparesis and chronic kidney disease who was brought into the emergency room for evaluation of generalized weakness. Per EMS, police were called to patient's home to serve a warrant on another resident. When they noted the condition the patient was living in, APS was contacted and they recommended the patient be brought to the ER for further evaluation. Patient states that he has not had anything to eat for at least 5 days prior to coming in and complained of nausea and vomiting.   Clinical Impression   Gregory Moody was seen for OT evaluation this date, he was pleasant and willing to participate in an ADL assessment but politely repeatedly declined OOB mobility assessment. Prior to hospital admission, pt was independent, lives with family. Pt presents to acute OT demonstrating impaired ADL performance and functional mobility 2/2 functional strength/endurance deficits, and decreased safety awareness. Pt currently requires SETUP and increased time for bed level ADLs. Anticipate requiring SUPERVISION for toileting and bathing to maximize safety. Pt would benefit from skilled OT to address noted impairments and functional limitations (see below for any additional details) in order to maximize safety and independence while minimizing falls risk and caregiver burden. Upon hospital discharge, recommend HHOT to maximize pt safety and return to functional independence during meaningful occupations of daily life.     Follow Up Recommendations  Home health OT;Supervision - Intermittent    Equipment Recommendations  3 in 1 bedside commode    Recommendations for Other Services       Precautions / Restrictions Precautions Precautions:  Fall Restrictions Weight Bearing Restrictions: No      Mobility Bed Mobility Overal bed mobility: Independent             General bed mobility comments: Pt adjusted position in bed w/o assistance  Transfers                 General transfer comment: Pt deferred OOB mobility assessment 2/2 fatigue    Balance                                           ADL either performed or assessed with clinical judgement   ADL Overall ADL's : Needs assistance/impaired                                       General ADL Comments: Increased time to open food items (straws, apple juice cup). INDEPENDENT simulated face washing. SETUP tooth brushing bed level     Vision         Perception     Praxis      Pertinent Vitals/Pain Pain Assessment: No/denies pain     Hand Dominance Right   Extremity/Trunk Assessment Upper Extremity Assessment Upper Extremity Assessment: Generalized weakness(Decreased fine motor, opens food items c increased time)   Lower Extremity Assessment Lower Extremity Assessment: Generalized weakness       Communication Communication Communication: No difficulties   Cognition Arousal/Alertness: Awake/alert Behavior During Therapy: WFL for tasks assessed/performed Overall Cognitive Status: Within Functional Limits for tasks assessed  General Comments       Exercises Exercises: Other exercises Other Exercises Other Exercises: Pt educated re: falls prevention, DME recommendations, OT role Other Exercises: Self-drinking, simulated face washing, setup for tooth brushing at bed level, bed mobility   Shoulder Instructions      Home Living Family/patient expects to be discharged to:: Private residence Living Arrangements: Parent Available Help at Discharge: Family;Available PRN/intermittently Type of Home: Mobile home Home Access: Stairs to enter Entrance  Stairs-Number of Steps: 4 Entrance Stairs-Rails: Right;Left Home Layout: One level     Bathroom Shower/Tub: Occupational psychologist: Standard     Home Equipment: Environmental consultant - 2 wheels          Prior Functioning/Environment Level of Independence: Independent        Comments: does have a walker that he has had to use in the past        OT Problem List: Decreased strength;Decreased activity tolerance;Decreased knowledge of use of DME or AE      OT Treatment/Interventions: Self-care/ADL training;Therapeutic exercise;Neuromuscular education;Energy conservation;DME and/or AE instruction;Therapeutic activities;Visual/perceptual remediation/compensation;Balance training    OT Goals(Current goals can be found in the care plan section) Acute Rehab OT Goals Patient Stated Goal: pt wants to go home OT Goal Formulation: With patient Time For Goal Achievement: 01/13/20 Potential to Achieve Goals: Good ADL Goals Pt Will Perform Grooming: Independently;sitting Pt Will Perform Lower Body Dressing: Independently;sit to/from stand Pt Will Transfer to Toilet: Independently;ambulating;regular height toilet  OT Frequency: Min 1X/week   Barriers to D/C: Decreased caregiver support;Inaccessible home environment          Co-evaluation              AM-PAC OT "6 Clicks" Daily Activity     Outcome Measure Help from another person eating meals?: A Little Help from another person taking care of personal grooming?: A Little Help from another person toileting, which includes using toliet, bedpan, or urinal?: A Little Help from another person bathing (including washing, rinsing, drying)?: A Little Help from another person to put on and taking off regular upper body clothing?: A Little Help from another person to put on and taking off regular lower body clothing?: A Little 6 Click Score: 18   End of Session    Activity Tolerance: Patient limited by fatigue Patient left: in  bed;with call bell/phone within reach;with bed alarm set  OT Visit Diagnosis: Muscle weakness (generalized) (M62.81);Adult, failure to thrive (R62.7)                Time: 8280-0349 OT Time Calculation (min): 17 min Charges:  OT General Charges $OT Visit: 1 Visit OT Evaluation $OT Eval Low Complexity: 1 Low OT Treatments $Self Care/Home Management : 8-22 mins  Dessie Coma, M.S. OTR/L  12/30/19, 11:12 AM

## 2019-12-30 NOTE — Progress Notes (Signed)
Patient's blood sugar 58 and BP 86/59. Amp of D 50 given and received verbal order from Rufina Falco NP for one time 527ml NS bolus. Patient responding appropriately.

## 2019-12-30 NOTE — Progress Notes (Signed)
Daveion Robar YIR:[id number] DOB: 02/12/1976 DOA: 12/27/2019 .  PCP: [place]  Patient coming from: Home  I have personally briefly reviewed patient's old medical records in [place]  Chief Complaint: Weakness  HPI: Gregory Moody is a 44 y.o. male with medical history significant for diabetes mellitus with complications of gastroparesis and chronic kidney disease who was brought into the emergency room for evaluation of generalized weakness. Per EMS, police were called to patient's home to serve a warrant on another resident. When they noted the condition the patient was living in, APS was contacted and they recommended the patient be brought to the ER for further evaluation. Patient states that he has not had anything to eat for at least 5 days and complained of nausea and vomiting but denied having any abdominal pain or changes in his bowel habits. Per EMS, he told them that he was not eating because he wanted to die but does not have any active suicide ideations at this time. Patient lives at home with his mother who he states is his caregiver. He complains of being very weak to ambulate for about 5 days. He denies having any chest pain, shortness of breath, orthopnea or paroxysmal nocturnal dyspnea. He denies being any fever, chills, headaches or any mental status changes  ED Course: Patient was referred to the emergency room by APS because of poor living conditions. Labs revealed significant hyperglycemia with an anion gap metabolic acidosis. He was also hypotensive but asymptomatic. He was started on an insulin drip and received IV fluid hydration. He will be admitted to the hospital for further evaluation .  12/28/19 Pt seen for f/u. His insulin ggt stopped at midnight and he is alert awake and denies any complaints.  12/29/19 Seen today for follow-up he is alert awake feeling better moving he has done quite with physical therapy his walked to the restroom  and back, his weakness has resolved.  But patient still complains of difficulty with swallowing. Chart review has showed that patient had a positive TB QuantiFERON in 2017, she was followed by therapy for 7 months per patient history.  Patient had a CT scan in May of this year which was concerning for pulmonary nodule with recommendations to repeat CT in a few months during hospitalization when he was evaluated by infectious disease And pulmonary and was discontinued off of any isolation precautions.  Discussed with patient that I am going to need to obtain imaging of his chest in addition to possibly a barium swallow for his odynophagia.  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  Pt today is alert and oriented and doing well. He is having diarrhea and is on po vancomycin for his c.diff. stool guaiac is pending because c./diff diarrhea will probably make fecal occult card heme positive.  D/W Pt that he can probably go home in 4-5 days. Pt verbalized understanding.  Labs today shows potassium is 3.2 magnesium is low at 1.5 ,hemoglobin is 7.9 we will transfuse.    Review of Systems: As per HPI otherwise 10 point review of systems negative.      Past Medical History:  Diagnosis Date  . COVID-19   . Diabetes mellitus without complication ([place])   . Gastroparesis   . Tuberculosis         Past Surgical History:  Procedure Laterality Date  . ESOPHAGOGASTRODUODENOSCOPY N/A 02/03/2019   Procedure: ESOPHAGOGASTRODUODENOSCOPY (EGD); Surgeon: [place], Stinnett  K, MD; Location: [facility] ENDOSCOPY; Service: Gastroenterology; Laterality: N/A;   reports that he has been smoking. He has never used smokeless tobacco. He reports that he does not drink alcohol or use drugs.  No Known Allergies       Family History  Problem Relation Age of Onset  . Diabetes Mother   . Diabetes Father           Prior to Admission medications   Medication Sig  Start Date End Date Taking? Authorizing Provider  insulin NPH-regular Human (70-30) 100 UNIT/ML injection Inject 20 Units into the skin 2 (two) times daily with a meal. 25 units with breakfast in am and 35 units with supper 04/03/19  Yes [name], MD  aspirin EC 81 MG tablet Take 81 mg by mouth daily.    [provider]  loperamide (IMODIUM A-D) 2 MG tablet Take 1 tablet (2 mg total) by mouth 4 (four) times daily as needed for diarrhea or loose stools.  Patient not taking: Reported on 12/27/2019 04/19/19   [name], MD  naproxen (NAPROSYN) 500 MG tablet Take 1 tablet (500 mg total) by mouth 2 (two) times daily with a meal.  Patient not taking: Reported on 12/27/2019 10/10/19   [name], MD  ondansetron (ZOFRAN) 4 MG tablet Take 1 tablet (4 mg total) by mouth every 6 (six) hours as needed for nausea.  Patient not taking: Reported on 12/27/2019 04/03/19   [name], MD  pantoprazole (PROTONIX) 40 MG tablet Take 1 tablet (40 mg total) by mouth 2 (two) times daily before a meal.  Patient not taking: Reported on 12/27/2019 04/03/19   [name], MD  Physical Exam:       Vitals:   12/27/19 1026 12/27/19 1028 12/27/19 1048  BP:  (!) 89/64   Pulse:  82   Resp:  18   Temp:   (!) 94.2 F (34.6 C)  TempSrc:   Rectal  SpO2:  95%   Weight: 45.4 kg    Height: _0  (1.676 m)          Vitals:   12/27/19 1026 12/27/19 1028 12/27/19 1048  BP:  (!) 89/64   Pulse:  82   Resp:  18   Temp:   (!) 94.2 F (34.6 C)  TempSrc:   Rectal  SpO2:  95%   Weight: 45.4 kg    Height: _1  (1.676 m)     Constitutional: NAD, alert and oriented x 3. Frail,Thin. Eyes: PERRL, lids and conjunctivae normal.  ENMT: Mucous membranes dry. Neck: normal, supple, no masses, no thyromegaly.  Respiratory: Mild inspiratory wheezing resident all throughout lung field posteriorly  cardiovascular: Regular rate and rhythm, no murmurs / rubs / gallops. No extremity edema.  2+ pedal pulses. No carotid bruits.  Abdomen: no tenderness, no masses palpated. No hepatosplenomegaly. Bowel sounds positive.  Musculoskeletal: no clubbing / cyanosis.  Skin: no rashes, skin tears to both elbows and wrists of upper extremities, ulcers.  Neurologic: No gross focal neurologic deficit. Generalized weakness  Psychiatric: appropriate   Lab Results:  Results for orders placed or performed during the hospital encounter of 12/27/19 (from the past 72 hour(s))  Glucose, capillary     Status: Abnormal   Collection Time: 12/27/19  3:06 PM  Result Value Ref Range   Glucose-Capillary 204 (H) 70 - 99 mg/dL    Comment: Glucose reference range applies only to samples taken after fasting for at least 8 hours.  Glucose, capillary  Status: Abnormal   Collection Time: 12/27/19  3:52 PM  Result Value Ref Range   Glucose-Capillary 207 (H) 70 - 99 mg/dL    Comment: Glucose reference range applies only to samples taken after fasting for at least 8 hours.  Basic metabolic panel     Status: Abnormal   Collection Time: 12/27/19  3:59 PM  Result Value Ref Range   Sodium 133 (L) 135 - 145 mmol/L   Potassium 3.5 3.5 - 5.1 mmol/L   Chloride 101 98 - 111 mmol/L   CO2 19 (L) 22 - 32 mmol/L   Glucose, Bld 204 (H) 70 - 99 mg/dL    Comment: Glucose reference range applies only to samples taken after fasting for at least 8 hours.   BUN 57 (H) 6 - 20 mg/dL   Creatinine, Ser 2.52 (H) 0.61 - 1.24 mg/dL   Calcium 8.0 (L) 8.9 - 10.3 mg/dL   GFR calc non Af Amer 30 (L) >60 mL/min   GFR calc Af Amer 35 (L) >60 mL/min   Anion gap 13 5 - 15    Comment: Performed at [facility], [address] [place], [place] [address]  Glucose, capillary     Status: Abnormal   Collection Time: 12/27/19  5:32 PM  Result Value Ref Range   Glucose-Capillary 139 (H) 70 - 99 mg/dL    Comment: Glucose reference range applies only to samples taken after fasting for at least 8 hours.  Glucose, capillary      Status: Abnormal   Collection Time: 12/27/19  7:27 PM  Result Value Ref Range   Glucose-Capillary 178 (H) 70 - 99 mg/dL    Comment: Glucose reference range applies only to samples taken after fasting for at least 8 hours.  MRSA PCR Screening     Status: None   Collection Time: 12/27/19  8:37 PM   Specimen: Nasopharyngeal  Result Value Ref Range   MRSA by PCR NEGATIVE NEGATIVE    Comment:        The GeneXpert MRSA Assay (FDA approved for NASAL specimens only), is one component of a comprehensive MRSA colonization surveillance program. It is not intended to diagnose MRSA infection nor to guide or monitor treatment for MRSA infections. Performed at [facility], [address] [place], [place] [address]   Glucose, capillary     Status: Abnormal   Collection Time: 12/27/19  8:39 PM  Result Value Ref Range   Glucose-Capillary 182 (H) 70 - 99 mg/dL    Comment: Glucose reference range applies only to samples taken after fasting for at least 8 hours.  Beta-hydroxybutyric acid     Status: Abnormal   Collection Time: 12/27/19  8:49 PM  Result Value Ref Range   Beta-Hydroxybutyric Acid 2.23 (H) 0.05 - 0.27 mmol/L    Comment: Performed at [facility], [address] [place], [place] [address]  Basic metabolic panel     Status: Abnormal   Collection Time: 12/27/19  8:49 PM  Result Value Ref Range   Sodium 134 (L) 135 - 145 mmol/L   Potassium 4.6 3.5 - 5.1 mmol/L    Comment: HEMOLYSIS AT THIS LEVEL MAY AFFECT RESULT   Chloride 104 98 - 111 mmol/L   CO2 19 (L) 22 - 32 mmol/L   Glucose, Bld 201 (H) 70 - 99 mg/dL    Comment: Glucose reference range applies only to samples taken after fasting for at least 8 hours.   BUN 53 (H) 6 -  20 mg/dL   Creatinine, Ser 2.24 (H) 0.61 - 1.24 mg/dL   Calcium 8.1 (L) 8.9 - 10.3 mg/dL   GFR calc non Af Amer 34 (L) >60 mL/min   GFR calc Af Amer 40 (L) >60 mL/min   Anion gap 11 5 - 15    Comment: Performed at [facility],  [address] [place], [place] [address]  Glucose, capillary     Status: Abnormal   Collection Time: 12/27/19 10:00 PM  Result Value Ref Range   Glucose-Capillary 140 (H) 70 - 99 mg/dL    Comment: Glucose reference range applies only to samples taken after fasting for at least 8 hours.  Glucose, capillary     Status: Abnormal   Collection Time: 12/27/19 10:56 PM  Result Value Ref Range   Glucose-Capillary 151 (H) 70 - 99 mg/dL    Comment: Glucose reference range applies only to samples taken after fasting for at least 8 hours.  Glucose, capillary     Status: Abnormal   Collection Time: 12/27/19 11:53 PM  Result Value Ref Range   Glucose-Capillary 161 (H) 70 - 99 mg/dL    Comment: Glucose reference range applies only to samples taken after fasting for at least 8 hours.  Glucose, capillary     Status: Abnormal   Collection Time: 12/28/19 12:59 AM  Result Value Ref Range   Glucose-Capillary 206 (H) 70 - 99 mg/dL    Comment: Glucose reference range applies only to samples taken after fasting for at least 8 hours.  Basic metabolic panel     Status: Abnormal   Collection Time: 12/28/19  1:59 AM  Result Value Ref Range   Sodium 135 135 - 145 mmol/L   Potassium 3.4 (L) 3.5 - 5.1 mmol/L   Chloride 104 98 - 111 mmol/L   CO2 21 (L) 22 - 32 mmol/L   Glucose, Bld 208 (H) 70 - 99 mg/dL    Comment: Glucose reference range applies only to samples taken after fasting for at least 8 hours.   BUN 49 (H) 6 - 20 mg/dL   Creatinine, Ser 1.91 (H) 0.61 - 1.24 mg/dL   Calcium 8.1 (L) 8.9 - 10.3 mg/dL   GFR calc non Af Amer 42 (L) >60 mL/min   GFR calc Af Amer 48 (L) >60 mL/min   Anion gap 10 5 - 15    Comment: Performed at [facility], [address] [place], [place] [address]  Beta-hydroxybutyric acid     Status: Abnormal   Collection Time: 12/28/19  1:59 AM  Result Value Ref Range   Beta-Hydroxybutyric Acid 0.82 (H) 0.05 - 0.27 mmol/L    Comment: Performed at [facility],  [address] [place], [place] [address]  Basic metabolic panel     Status: Abnormal   Collection Time: 12/28/19  5:34 AM  Result Value Ref Range   Sodium 133 (L) 135 - 145 mmol/L   Potassium 3.3 (L) 3.5 - 5.1 mmol/L   Chloride 106 98 - 111 mmol/L   CO2 19 (L) 22 - 32 mmol/L   Glucose, Bld 142 (H) 70 - 99 mg/dL    Comment: Glucose reference range applies only to samples taken after fasting for at least 8 hours.   BUN 45 (H) 6 - 20 mg/dL   Creatinine, Ser 1.66 (H) 0.61 - 1.24 mg/dL   Calcium 7.8 (L) 8.9 - 10.3 mg/dL   GFR calc non Af Amer 49 (L) >60 mL/min   GFR  calc Af Amer 57 (L) >60 mL/min   Anion gap 8 5 - 15    Comment: Performed at [facility], [address] [place], [place] [address]  CBC     Status: Abnormal   Collection Time: 12/28/19  5:42 AM  Result Value Ref Range   WBC 9.1 4.0 - 10.5 K/uL   RBC 2.61 (L) 4.22 - 5.81 MIL/uL   Hemoglobin 8.1 (L) 13.0 - 17.0 g/dL   HCT 24.1 (L) 39.0 - 52.0 %   MCV 92.3 80.0 - 100.0 fL   MCH 31.0 26.0 - 34.0 pg   MCHC 33.6 30.0 - 36.0 g/dL   RDW 12.7 11.5 - 15.5 %   Platelets 173 150 - 400 K/uL   nRBC 0.0 0.0 - 0.2 %    Comment: Performed at [facility], [address], [place], [place] [address]  Prepare RBC (crossmatch)     Status: None   Collection Time: 12/28/19  6:16 AM  Result Value Ref Range   Order Confirmation      ORDER PROCESSED BY BLOOD BANK Performed at [facility], [address], [place], [place] [address]   Type and screen [place]     Status: None (Preliminary result)   Collection Time: 12/28/19  7:29 AM  Result Value Ref Range   ABO/RH(D) O POS    Antibody Screen NEG    Sample Expiration      12/31/2019,2359 Performed at [facility], [address], [place], [place] [address]    Unit Number [id number]    Blood Component Type RED CELLS,LR    Unit division 00    Status of Unit ALLOCATED    Transfusion Status OK TO TRANSFUSE     Crossmatch Result Compatible   Troponin I (High Sensitivity)     Status: None   Collection Time: 12/28/19  7:29 AM  Result Value Ref Range   Troponin I (High Sensitivity) 9 <18 ng/L    Comment: (NOTE) Elevated high sensitivity troponin I (hsTnI) values and significant  changes across serial measurements may suggest ACS but many other  chronic and acute conditions are known to elevate hsTnI results.  Refer to the "Links" section for chest pain algorithms and additional  guidance. Performed at [facility], [address] [place], [place] [address]   Glucose, capillary     Status: Abnormal   Collection Time: 12/28/19  7:37 AM  Result Value Ref Range   Glucose-Capillary 105 (H) 70 - 99 mg/dL    Comment: Glucose reference range applies only to samples taken after fasting for at least 8 hours.  Glucose, capillary     Status: Abnormal   Collection Time: 12/28/19 11:38 AM  Result Value Ref Range   Glucose-Capillary 51 (L) 70 - 99 mg/dL    Comment: Glucose reference range applies only to samples taken after fasting for at least 8 hours.  Glucose, capillary     Status: Abnormal   Collection Time: 12/28/19 12:02 PM  Result Value Ref Range   Glucose-Capillary 49 (L) 70 - 99 mg/dL    Comment: Glucose reference range applies only to samples taken after fasting for at least 8 hours.  Magnesium     Status: None   Collection Time: 12/28/19 12:36 PM  Result Value Ref Range   Magnesium 2.0 1.7 - 2.4 mg/dL    Comment: Performed at [facility], [address], [place], [place] [address]  Phosphorus  Status: Abnormal   Collection Time: 12/28/19 12:36 PM  Result Value Ref Range   Phosphorus 1.8 (L) 2.5 - 4.6 mg/dL    Comment: Performed at [facility], [address] [place], [place] [address]  Glucose, capillary     Status: Abnormal   Collection Time: 12/28/19 12:40 PM  Result Value Ref Range   Glucose-Capillary 212 (H) 70 - 99 mg/dL    Comment: Glucose  reference range applies only to samples taken after fasting for at least 8 hours.  Vitamin B12     Status: None   Collection Time: 12/28/19  2:07 PM  Result Value Ref Range   Vitamin B-12 787 180 - 914 pg/mL    Comment: (NOTE) This assay is not validated for testing neonatal or myeloproliferative syndrome specimens for Vitamin B12 levels. Performed at [facility], [address] [place], [place] [address]   Folate     Status: None   Collection Time: 12/28/19  2:07 PM  Result Value Ref Range   Folate 7.6 >5.9 ng/mL    Comment: Performed at [facility], [address] [place], [place] [address]  TSH     Status: None   Collection Time: 12/28/19  2:07 PM  Result Value Ref Range   TSH 1.051 0.350 - 4.500 uIU/mL    Comment: Performed by a 3rd Generation assay with a functional sensitivity of <=0.01 uIU/mL. Performed at [facility], [address] [place], [place] [address]   C difficile quick scan w PCR reflex     Status: Abnormal   Collection Time: 12/28/19  4:40 PM   Specimen: STOOL  Result Value Ref Range   C Diff antigen POSITIVE (A) NEGATIVE   C Diff toxin POSITIVE (A) NEGATIVE   C Diff interpretation Toxin producing C. difficile detected.     Comment: CRITICAL RESULT CALLED TO, READ BACK BY AND VERIFIED WITH: SABRINA HESTER 12/28/19 @ [place] Performed at [facility], [address] [place], [place] [address]   Urine Culture     Status: Abnormal   Collection Time: 12/28/19  4:40 PM   Specimen: Urine, Random  Result Value Ref Range   Specimen Description URINE, RANDOM    Special Requests      NONE Performed at [facility], [address] [place], [place] [address]    Culture (A)     >=100,000 COLONIES/mL MULTIPLE SPECIES PRESENT, SUGGEST RECOLLECTION   Report Status 12/30/2019 FINAL   Chlamydia/NGC rt PCR ([place] only)     Status: None   Collection Time: 12/28/19  4:40 PM   Specimen: STOOL  Result Value Ref Range    Specimen source GC/Chlam URINE, RANDOM    Chlamydia Tr NOT DETECTED NOT DETECTED   N gonorrhoeae NOT DETECTED NOT DETECTED    Comment: (NOTE) This CT/NG assay has not been evaluated in patients with a history of  hysterectomy. Performed at [facility], [address] [place], [place] [address]   Occult blood card to lab, stool RN will collect     Status: Abnormal   Collection Time: 12/28/19  4:40 PM  Result Value Ref Range   Fecal Occult Bld POSITIVE (A) NEGATIVE    Comment: Performed at [facility], [address] [place], [place] [address]  Glucose, capillary     Status: Abnormal   Collection Time: 12/28/19  5:35 PM  Result Value Ref Range   Glucose-Capillary 263 (H) 70 - 99 mg/dL  Comment: Glucose reference range applies only to samples taken after fasting for at least 8 hours.   Comment 1 Notify RN   HIV Antibody (routine testing w rflx)     Status: None   Collection Time: 12/28/19  6:22 PM  Result Value Ref Range   HIV Screen 4th Generation wRfx NON REACTIVE NON REACTIVE    Comment: Performed at [facility], [place] [address], [place], [place] [address]  RPR     Status: None   Collection Time: 12/28/19  6:22 PM  Result Value Ref Range   RPR Ser Ql NON REACTIVE NON REACTIVE    Comment: Performed at [facility], [place] [address] [place], [place] [address]  CBC with Differential/Platelet     Status: Abnormal   Collection Time: 12/28/19  6:22 PM  Result Value Ref Range   WBC 12.4 (H) 4.0 - 10.5 K/uL   RBC 2.87 (L) 4.22 - 5.81 MIL/uL   Hemoglobin 8.8 (L) 13.0 - 17.0 g/dL   HCT 26.8 (L) 39.0 - 52.0 %   MCV 93.4 80.0 - 100.0 fL   MCH 30.7 26.0 - 34.0 pg   MCHC 32.8 30.0 - 36.0 g/dL   RDW 12.9 11.5 - 15.5 %   Platelets 194 150 - 400 K/uL   nRBC 0.0 0.0 - 0.2 %   Neutrophils Relative % 90 %   Neutro Abs 11.4 (H) 1.7 - 7.7 K/uL   Lymphocytes Relative 5 %   Lymphs Abs 0.6 (L) 0.7 - 4.0 K/uL   Monocytes Relative 3 %   Monocytes Absolute  0.3 0.1 - 1.0 K/uL   Eosinophils Relative 1 %   Eosinophils Absolute 0.1 0.0 - 0.5 K/uL   Basophils Relative 1 %   Basophils Absolute 0.1 0.0 - 0.1 K/uL   Immature Granulocytes 0 %   Abs Immature Granulocytes 0.04 0.00 - 0.07 K/uL    Comment: Performed at [facility], [address] [place], [place] [address]  Comprehensive metabolic panel     Status: Abnormal   Collection Time: 12/28/19  6:22 PM  Result Value Ref Range   Sodium 132 (L) 135 - 145 mmol/L   Potassium 3.9 3.5 - 5.1 mmol/L   Chloride 106 98 - 111 mmol/L   CO2 21 (L) 22 - 32 mmol/L   Glucose, Bld 245 (H) 70 - 99 mg/dL    Comment: Glucose reference range applies only to samples taken after fasting for at least 8 hours.   BUN 37 (H) 6 - 20 mg/dL   Creatinine, Ser 1.46 (H) 0.61 - 1.24 mg/dL   Calcium 8.1 (L) 8.9 - 10.3 mg/dL   Total Protein 6.0 (L) 6.5 - 8.1 g/dL   Albumin 2.7 (L) 3.5 - 5.0 g/dL   AST 11 (L) 15 - 41 U/L   ALT 7 0 - 44 U/L   Alkaline Phosphatase 66 38 - 126 U/L   Total Bilirubin 1.1 0.3 - 1.2 mg/dL   GFR calc non Af Amer 58 (L) >60 mL/min   GFR calc Af Amer >60 >60 mL/min   Anion gap 5 5 - 15    Comment: Performed at [facility], [address], [place], [place] [address]  Magnesium     Status: None   Collection Time: 12/28/19  6:22 PM  Result Value Ref Range   Magnesium 2.0 1.7 - 2.4 mg/dL    Comment: Performed at [facility], [address] [place],  [address]  Phosphorus     Status: Abnormal   Collection Time: 12/28/19  6:22 PM  Result Value Ref Range   Phosphorus 2.2 (L) 2.5 - 4.6 mg/dL    Comment: Performed at [facility], [address] [place], [place] [address]  Glucose, capillary     Status: Abnormal   Collection Time: 12/28/19 10:17 PM  Result Value Ref Range   Glucose-Capillary 138 (H) 70 - 99 mg/dL    Comment: Glucose reference range applies only to samples taken after fasting for at least 8 hours.  Magnesium     Status: None    Collection Time: 12/29/19 12:12 AM  Result Value Ref Range   Magnesium 1.9 1.7 - 2.4 mg/dL    Comment: Performed at [facility], [address] [place], [place] [address]  Phosphorus     Status: Abnormal   Collection Time: 12/29/19 12:12 AM  Result Value Ref Range   Phosphorus 1.7 (L) 2.5 - 4.6 mg/dL    Comment: Performed at [facility], [address] [place], [place] [address]  Lipase, blood     Status: None   Collection Time: 12/29/19 12:12 AM  Result Value Ref Range   Lipase 22 11 - 51 U/L    Comment: Performed at [facility], [address] [place], [place] [address]  Hemoglobin A1c     Status: Abnormal   Collection Time: 12/29/19 12:12 AM  Result Value Ref Range   Hgb A1c MFr Bld 15.0 (H) 4.8 - 5.6 %    Comment: (NOTE) Pre diabetes:          5.7%-6.4% Diabetes:              >6.4% Glycemic control for   <7.0% adults with diabetes    Mean Plasma Glucose 383.8 mg/dL    Comment: Performed at [facility] [address] [place], [place] [address]  Iron and TIBC     Status: Abnormal   Collection Time: 12/29/19 12:12 AM  Result Value Ref Range   Iron 12 (L) 45 - 182 ug/dL   TIBC 144 (L) 250 - 450 ug/dL   Saturation Ratios 8 (L) 17.9 - 39.5 %   UIBC 132 ug/dL    Comment: Performed at [facility], [address] [place], [place] [address]  Ferritin     Status: None   Collection Time: 12/29/19 12:12 AM  Result Value Ref Range   Ferritin 166 24 - 336 ng/mL    Comment: Performed at [facility], [address] [place], [place] [address]  Reticulocytes     Status: Abnormal   Collection Time: 12/29/19 12:12 AM  Result Value Ref Range   Retic Ct Pct 1.6 0.4 - 3.1 %   RBC. 2.71 (L) 4.22 - 5.81 MIL/uL   Retic Count, Absolute 42.5 19.0 - 186.0 K/uL   Immature Retic Fract 9.0 2.3 - 15.9 %    Comment: Performed at [facility], [address] [place], [place] [address]  CBC with Differential/Platelet      Status: Abnormal   Collection Time: 12/29/19  8:29 AM  Result Value Ref Range   WBC 11.4 (H) 4.0 - 10.5 K/uL   RBC 2.66 (L) 4.22 - 5.81 MIL/uL   Hemoglobin 8.3 (L) 13.0 - 17.0 g/dL   HCT 24.3 (L) 39.0 - 52.0 %   MCV 91.4 80.0 - 100.0 fL   MCH 31.2 26.0 - 34.0 pg   MCHC 34.2 30.0 -  36.0 g/dL   RDW 12.8 11.5 - 15.5 %   Platelets 166 150 - 400 K/uL   nRBC 0.0 0.0 - 0.2 %   Neutrophils Relative % 89 %   Neutro Abs 10.1 (H) 1.7 - 7.7 K/uL   Lymphocytes Relative 7 %   Lymphs Abs 0.9 0.7 - 4.0 K/uL   Monocytes Relative 3 %   Monocytes Absolute 0.3 0.1 - 1.0 K/uL   Eosinophils Relative 1 %   Eosinophils Absolute 0.1 0.0 - 0.5 K/uL   Basophils Relative 0 %   Basophils Absolute 0.0 0.0 - 0.1 K/uL   Immature Granulocytes 0 %   Abs Immature Granulocytes 0.05 0.00 - 0.07 K/uL    Comment: Performed at [facility], [address] [place], [place] [address]  Comprehensive metabolic panel     Status: Abnormal   Collection Time: 12/29/19  8:29 AM  Result Value Ref Range   Sodium 133 (L) 135 - 145 mmol/L   Potassium 3.7 3.5 - 5.1 mmol/L   Chloride 105 98 - 111 mmol/L   CO2 21 (L) 22 - 32 mmol/L   Glucose, Bld 221 (H) 70 - 99 mg/dL    Comment: Glucose reference range applies only to samples taken after fasting for at least 8 hours.   BUN 27 (H) 6 - 20 mg/dL   Creatinine, Ser 1.23 0.61 - 1.24 mg/dL   Calcium 8.0 (L) 8.9 - 10.3 mg/dL   Total Protein 5.4 (L) 6.5 - 8.1 g/dL   Albumin 2.3 (L) 3.5 - 5.0 g/dL   AST 10 (L) 15 - 41 U/L   ALT 7 0 - 44 U/L   Alkaline Phosphatase 59 38 - 126 U/L   Total Bilirubin 0.6 0.3 - 1.2 mg/dL   GFR calc non Af Amer >60 >60 mL/min   GFR calc Af Amer >60 >60 mL/min   Anion gap 7 5 - 15    Comment: Performed at [facility], [address], [place], [place] [address]  Magnesium     Status: None   Collection Time: 12/29/19  8:29 AM  Result Value Ref Range   Magnesium 1.9 1.7 - 2.4 mg/dL    Comment: Performed at [facility], [address], [place], [place] [address]  Phosphorus     Status: None   Collection Time: 12/29/19  8:29 AM  Result Value Ref Range   Phosphorus 2.9 2.5 - 4.6 mg/dL    Comment: Performed at [facility], [address] [place], [place] [address]  Fibrin derivatives D-Dimer [facility] only)     Status: Abnormal   Collection Time: 12/29/19 11:21 AM  Result Value Ref Range   Fibrin derivatives D-dimer ([facility]) 1,124.50 (H) 0.00 - 499.00 ng/mL (FEU)    Comment: (NOTE) <> Exclusion of Venous Thromboembolism (VTE) - OUTPATIENT ONLY   (Emergency Department or [place])   0-499 ng/ml (FEU): With a low to intermediate pretest probability                      for VTE this test result excludes the diagnosis                      of VTE.   >499 ng/ml (FEU) : VTE not excluded; additional work up for VTE is                      required. <> Testing on Inpatients and Evaluation  of Disseminated Intravascular   Coagulation (DIC) Reference Range:   0-499 ng/ml (FEU) Performed at [facility], [address] [place], [place] [address]   Glucose, capillary     Status: Abnormal   Collection Time: 12/29/19 11:35 AM  Result Value Ref Range   Glucose-Capillary 255 (H) 70 - 99 mg/dL    Comment: Glucose reference range applies only to samples taken after fasting for at least 8 hours.  Glucose, capillary     Status: Abnormal   Collection Time: 12/29/19  4:30 PM  Result Value Ref Range   Glucose-Capillary 109 (H) 70 - 99 mg/dL    Comment: Glucose reference range applies only to samples taken after fasting for at least 8 hours.  Glucose, capillary     Status: Abnormal   Collection Time: 12/29/19 11:24 PM  Result Value Ref Range   Glucose-Capillary 58 (L) 70 - 99 mg/dL    Comment: Glucose reference range applies only to samples taken after fasting for at least 8 hours.  Glucose, capillary     Status: Abnormal   Collection Time: 12/29/19 11:47 PM  Result Value Ref Range   Glucose-Capillary 205 (H)  70 - 99 mg/dL    Comment: Glucose reference range applies only to samples taken after fasting for at least 8 hours.  Glucose, capillary     Status: Abnormal   Collection Time: 12/30/19  2:08 AM  Result Value Ref Range   Glucose-Capillary 175 (H) 70 - 99 mg/dL    Comment: Glucose reference range applies only to samples taken after fasting for at least 8 hours.  Magnesium     Status: Abnormal   Collection Time: 12/30/19  4:31 AM  Result Value Ref Range   Magnesium 1.5 (L) 1.7 - 2.4 mg/dL    Comment: Performed at [facility], [address] [place], [place] [address]  Phosphorus     Status: Abnormal   Collection Time: 12/30/19  4:31 AM  Result Value Ref Range   Phosphorus 2.1 (L) 2.5 - 4.6 mg/dL    Comment: Performed at [facility], [address] [place], [place] [address]  CBC with Differential/Platelet     Status: Abnormal   Collection Time: 12/30/19  4:31 AM  Result Value Ref Range   WBC 10.0 4.0 - 10.5 K/uL   RBC 2.48 (L) 4.22 - 5.81 MIL/uL   Hemoglobin 7.9 (L) 13.0 - 17.0 g/dL   HCT 22.8 (L) 39.0 - 52.0 %   MCV 91.9 80.0 - 100.0 fL   MCH 31.9 26.0 - 34.0 pg   MCHC 34.6 30.0 - 36.0 g/dL   RDW 12.6 11.5 - 15.5 %   Platelets 156 150 - 400 K/uL   nRBC 0.0 0.0 - 0.2 %   Neutrophils Relative % 82 %   Neutro Abs 8.2 (H) 1.7 - 7.7 K/uL   Lymphocytes Relative 14 %   Lymphs Abs 1.4 0.7 - 4.0 K/uL   Monocytes Relative 3 %   Monocytes Absolute 0.3 0.1 - 1.0 K/uL   Eosinophils Relative 0 %   Eosinophils Absolute 0.0 0.0 - 0.5 K/uL   Basophils Relative 0 %   Basophils Absolute 0.0 0.0 - 0.1 K/uL   Immature Granulocytes 1 %   Abs Immature Granulocytes 0.05 0.00 - 0.07 K/uL    Comment: Performed at [facility], [address], [place], [place] [address]  Comprehensive metabolic panel     Status: Abnormal   Collection Time: 12/30/19  4:31 AM  Result Value Ref Range   Sodium 135 135 - 145 mmol/L   Potassium 3.2 (L) 3.5 - 5.1 mmol/L   Chloride 102 98  - 111 mmol/L   CO2 23 22 - 32 mmol/L   Glucose, Bld 159 (H) 70 - 99 mg/dL    Comment: Glucose reference range applies only to samples taken after fasting for at least 8 hours.   BUN 17 6 - 20 mg/dL   Creatinine, Ser 1.00 0.61 - 1.24 mg/dL   Calcium 7.7 (L) 8.9 - 10.3 mg/dL   Total Protein 4.9 (L) 6.5 - 8.1 g/dL   Albumin 2.0 (L) 3.5 - 5.0 g/dL   AST 11 (L) 15 - 41 U/L   ALT 5 0 - 44 U/L   Alkaline Phosphatase 59 38 - 126 U/L   Total Bilirubin 0.7 0.3 - 1.2 mg/dL   GFR calc non Af Amer >60 >60 mL/min   GFR calc Af Amer >60 >60 mL/min   Anion gap 10 5 - 15    Comment: Performed at [facility], [address] [place], [place] [address]  C-reactive protein     Status: Abnormal   Collection Time: 12/30/19  4:31 AM  Result Value Ref Range   CRP 9.4 (H) <1.0 mg/dL    Comment: Performed at [facility] [address] [place], [place] [address]  Glucose, capillary     Status: Abnormal   Collection Time: 12/30/19  5:41 AM  Result Value Ref Range   Glucose-Capillary 139 (H) 70 - 99 mg/dL    Comment: Glucose reference range applies only to samples taken after fasting for at least 8 hours.  Glucose, capillary     Status: Abnormal   Collection Time: 12/30/19  8:28 AM  Result Value Ref Range   Glucose-Capillary 161 (H) 70 - 99 mg/dL    Comment: Glucose reference range applies only to samples taken after fasting for at least 8 hours.  Glucose, capillary     Status: Abnormal   Collection Time: 12/30/19 11:27 AM  Result Value Ref Range   Glucose-Capillary 131 (H) 70 - 99 mg/dL    Comment: Glucose reference range applies only to samples taken after fasting for at least 8 hours.    Radiological Exams on Admission:  Imaging Results (Last 48 hours)  DG Chest Portable 1 View  Result Date: 12/27/2019  CLINICAL DATA: Weakness EXAM: PORTABLE CHEST 1 VIEW COMPARISON: Chest radiographs, 11/18/2019, CT chest, 02/13/2019 FINDINGS: The heart size and mediastinal contours are within  normal limits. Unchanged nodule of the peripheral right upper lobe. The visualized skeletal structures are unremarkable. IMPRESSION: No acute abnormality of the lungs in AP portable projection. Unchanged nodule of the peripheral right upper lobe, better evaluated by prior CT. Electronically Signed By: Eddie Candle M.D. On: 12/27/2019 10:47    EKG: Independently reviewed. Sinus rhythm  Premature ventricular complexes   Assessment/Plan: Odynophagia: Barium swallow monday  Liquid diet.  History of TB/right upper lobe nodule: Repeat CT shows multilobar pneumonia bilaterally, with concern for atypical bacterial including mycobacterial TB active infection.  Due to elevation of D-dimer will also consider CTA of the chest tomorrow to rule out any VTE's now that patient's AKI has resolved.  Diabetes mellitus type II: Continue with sliding scale insulin, Accu-Cheks, hypoglycemia protocol. Lantus started at 10 units at bedtime.  We will titrate dose and increase To twice daily dosing as  patient's hemoglobin A1c is 15. Home medications with long-acting insulin held.  Acute kidney  injury: Resolved  Depression: Patient verbalized not wanting to live and had been starving himself  Has no active suicidal ideation at this time  Will consult psychaitry   Protein calorie malnutrition (Severe):  Will consult dietician  Patient will benefit from nutritional supplements   Anemia of chronic disease:  Stable .  C.Diff Diarrhea: We will start pt on po vancomycin.   Poor living condition. Patient was referred to the ER by [facility]PS because of poor living conditions.  Patient may need alternative living conditions upon discharge. DVT prophylaxis: Heparin  Code Status: Full code  Family Communication: Plan of care was discussed with patient at the bedside. He verbalizes understanding and agrees with the plan  Disposition Plan: D/C home when ready. Consults called: Case management, Psychaitry

## 2019-12-30 NOTE — Progress Notes (Signed)
Per MD Posey Pronto pt does not need airborne precautions. Order updated.

## 2019-12-30 NOTE — Progress Notes (Signed)
Inpatient Diabetes Program Recommendations  AACE/ADA: New Consensus Statement on Inpatient Glycemic Control (2015)  Target Ranges:  Prepandial:   less than 140 mg/dL      Peak postprandial:   less than 180 mg/dL (1-2 hours)      Critically ill patients:  140 - 180 mg/dL   Lab Results  Component Value Date   GLUCAP 139 (H) 12/30/2019   HGBA1C 15.0 (H) 12/29/2019    Review of Glycemic Control Results for Gregory Moody, Gregory Moody (MRN [id number]) as of 12/30/2019 10:35  Ref. Range 12/29/2019 23:24 12/29/2019 23:47 12/30/2019 02:08 12/30/2019 05:41  Glucose-Capillary Latest Ref Range: 70 - 99 mg/dL 58 (L) 205 (H) 175 (H) 139 (H)   Diabetes history:DM1 Outpatient Diabetes medications:70/30 25 units QAM, 70/30 35 units QPM Current orders for Inpatient glycemic control:Lantus 10 units QHS, Novolog 0-9 units TID with meals, Novolog 0-5 units QHS, Novolog 3 units TID with meals for meal coverage  Inpatient Diabetes Program Recommendations:    Noted patient experienced hypoglycemia of 58 mg/dL following 3 units of meal coverage. Patient is also on full liquids and not eating consistently. Until oral intake improves, may want to consider decreasing meal coverage to Novolog 2 units TID and decrease correction to Novolog 0-6 units TID.  Thanks, Bronson Curb, MSN, RNC-OB Diabetes Coordinator [phone] (8a-5p)

## 2019-12-30 NOTE — Progress Notes (Signed)
Received verbal order from Rufina Falco NP to increase NS continuous fluids to 173ml/hr for BP starting to trend back down from bolus.

## 2019-12-31 ENCOUNTER — Inpatient Hospital Stay: Payer: Self-pay

## 2019-12-31 DIAGNOSIS — B3781 Candidal esophagitis: Secondary | ICD-10-CM

## 2019-12-31 LAB — CBC WITH DIFFERENTIAL/PLATELET
Abs Immature Granulocytes: 0.02 10*3/uL (ref 0.00–0.07)
Basophils Absolute: 0 10*3/uL (ref 0.0–0.1)
Basophils Relative: 0 %
Eosinophils Absolute: 0.1 10*3/uL (ref 0.0–0.5)
Eosinophils Relative: 1 %
HCT: 30.3 % — ABNORMAL LOW (ref 39.0–52.0)
Hemoglobin: 10.1 g/dL — ABNORMAL LOW (ref 13.0–17.0)
Immature Granulocytes: 0 %
Lymphocytes Relative: 15 %
Lymphs Abs: 1.4 10*3/uL (ref 0.7–4.0)
MCH: 29.8 pg (ref 26.0–34.0)
MCHC: 33.3 g/dL (ref 30.0–36.0)
MCV: 89.4 fL (ref 80.0–100.0)
Monocytes Absolute: 0.4 10*3/uL (ref 0.1–1.0)
Monocytes Relative: 5 %
Neutro Abs: 7.1 10*3/uL (ref 1.7–7.7)
Neutrophils Relative %: 79 %
Platelets: 178 10*3/uL (ref 150–400)
RBC: 3.39 MIL/uL — ABNORMAL LOW (ref 4.22–5.81)
RDW: 13.2 % (ref 11.5–15.5)
WBC: 9 10*3/uL (ref 4.0–10.5)
nRBC: 0 % (ref 0.0–0.2)

## 2019-12-31 LAB — GLUCOSE, CAPILLARY
Glucose-Capillary: 201 mg/dL — ABNORMAL HIGH (ref 70–99)
Glucose-Capillary: 219 mg/dL — ABNORMAL HIGH (ref 70–99)
Glucose-Capillary: 126 mg/dL — ABNORMAL HIGH (ref 70–99)
Glucose-Capillary: 88 mg/dL (ref 70–99)

## 2019-12-31 LAB — COMPREHENSIVE METABOLIC PANEL
ALT: 7 U/L (ref 0–44)
AST: 11 U/L — ABNORMAL LOW (ref 15–41)
Albumin: 2.3 g/dL — ABNORMAL LOW (ref 3.5–5.0)
Alkaline Phosphatase: 66 U/L (ref 38–126)
Anion gap: 7 (ref 5–15)
BUN: 13 mg/dL (ref 6–20)
CO2: 23 mmol/L (ref 22–32)
Calcium: 7.6 mg/dL — ABNORMAL LOW (ref 8.9–10.3)
Chloride: 101 mmol/L (ref 98–111)
Creatinine, Ser: 0.97 mg/dL (ref 0.61–1.24)
GFR calc Af Amer: >60 mL/min (ref >60)
GFR calc non Af Amer: >60 mL/min (ref >60)
Glucose, Bld: 237 mg/dL — ABNORMAL HIGH (ref 70–99)
Potassium: 3.7 mmol/L (ref 3.5–5.1)
Sodium: 131 mmol/L — ABNORMAL LOW (ref 135–145)
Total Bilirubin: 2.4 mg/dL — ABNORMAL HIGH (ref 0.3–1.2)
Total Protein: 5.5 g/dL — ABNORMAL LOW (ref 6.5–8.1)

## 2019-12-31 LAB — HEMOGLOBIN A1C
Hgb A1c MFr Bld: >15.5 % — ABNORMAL HIGH (ref 4.8–5.6)
Mean Plasma Glucose: >398 mg/dL

## 2019-12-31 LAB — PREPARE RBC (CROSSMATCH)

## 2019-12-31 LAB — PHOSPHORUS: Phosphorus: 2.9 mg/dL (ref 2.5–4.6)

## 2019-12-31 LAB — C-REACTIVE PROTEIN: CRP: 10.2 mg/dL — ABNORMAL HIGH (ref <1.0)

## 2019-12-31 LAB — MAGNESIUM: Magnesium: 1.6 mg/dL — ABNORMAL LOW (ref 1.7–2.4)

## 2019-12-31 IMAGING — RF DG ESOPHAGUS
9 of 12 series · 14 of 23 positions shown · non-contrast
Comparison: None.

CLINICAL DATA: Odynophagia. Trouble swallowing for 3 weeks.

EXAM:
ESOPHOGRAM / BARIUM SWALLOW / BARIUM TABLET STUDY
TECHNIQUE: Combined double contrast and single contrast examination performed
using effervescent crystals, thick barium liquid, and thin barium
liquid. The patient was observed with fluoroscopy swallowing a 13 mm
barium sulphate tablet.
FLUOROSCOPY TIME:  Fluoroscopy Time:  48 seconds
Radiation Exposure Index (if provided by the fluoroscopic device):
3.4 mGy
Number of Acquired Spot Images: 0

[Series 1: cp_standard · 0.25mm/px · 2 of 33 frames shown (1 of 9)]
[frame 1/33]
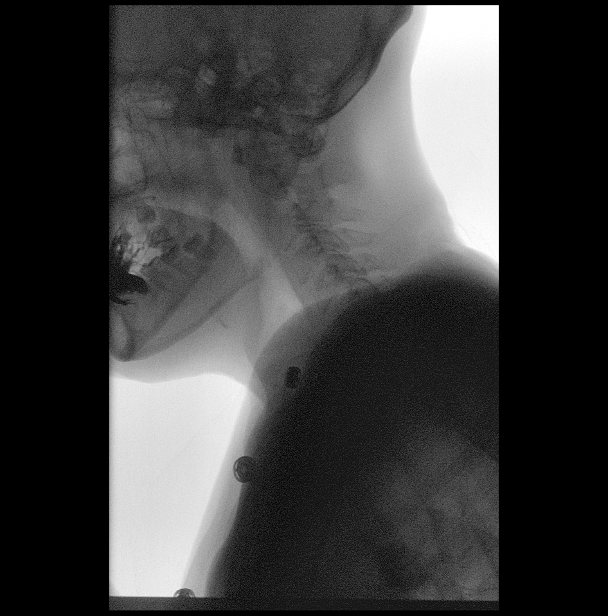
[frame 17/33]
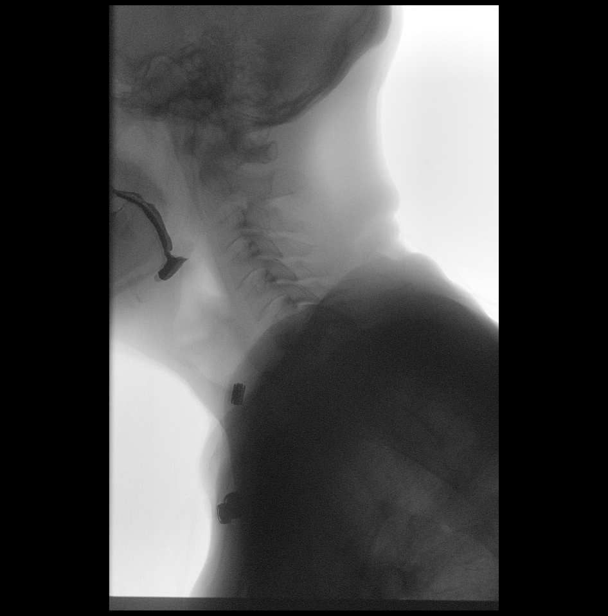

[Series 2: cp_standard · 0.26mm/px · 3 of 44 frames shown (2 of 9)]
[frame 7/44]
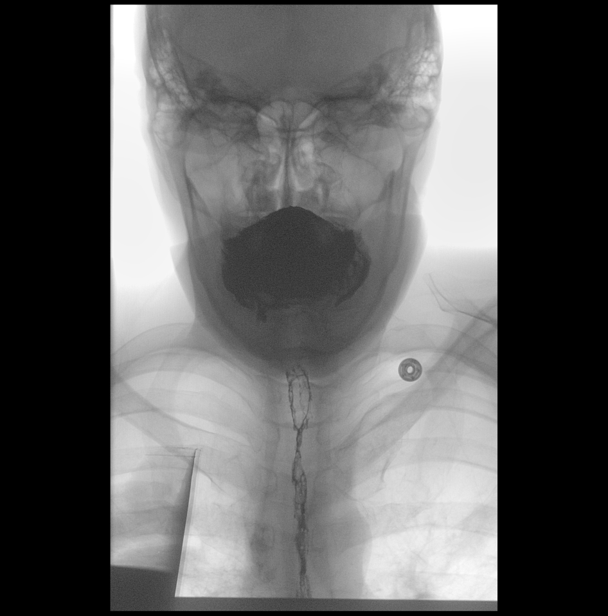
[frame 23/44]
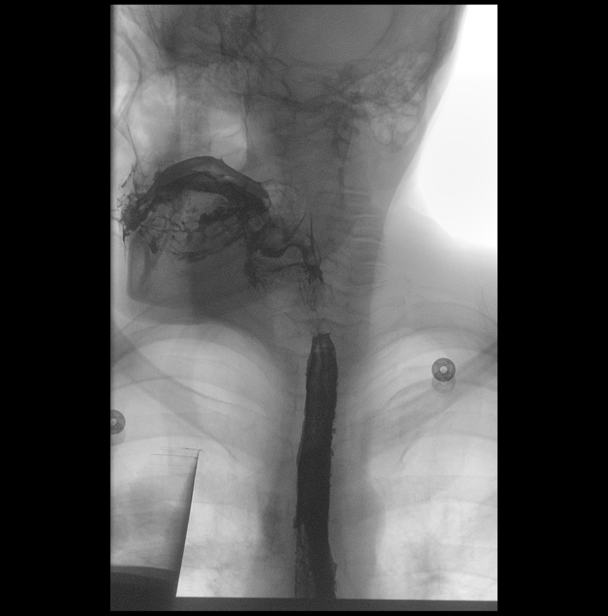
[frame 38/44]
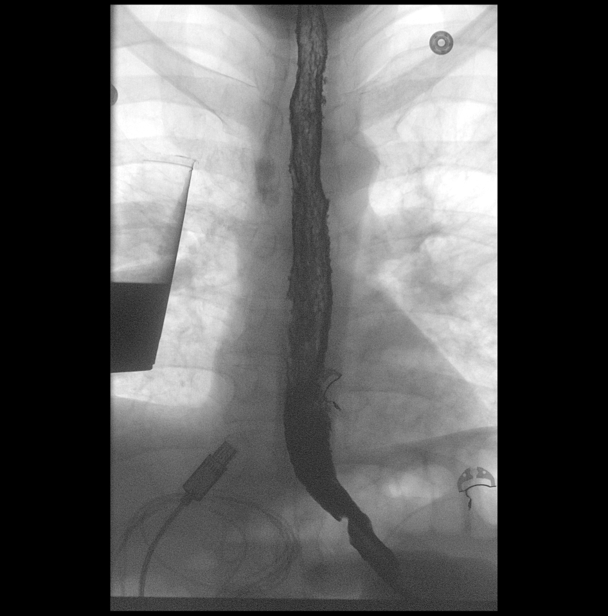

[Series 4: cp_standard · 0.25mm/px · 1 of 1 slices shown (3 of 9)]
[im 1/1]
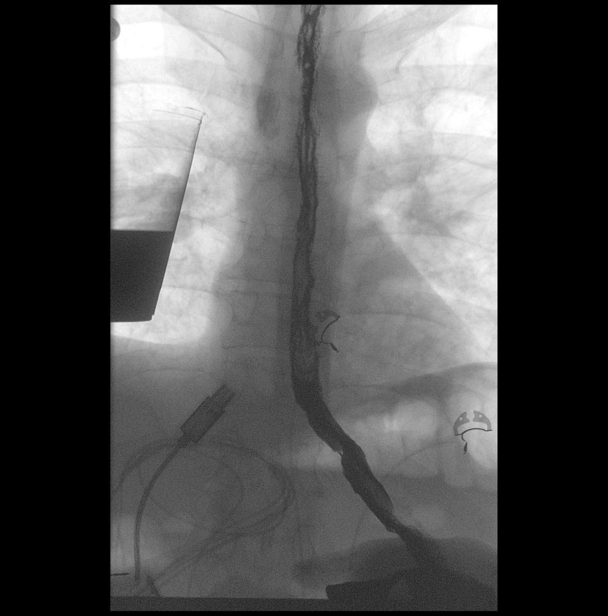

[Series 5: cp_standard · 0.25mm/px · 1 of 1 slices shown (4 of 9)]
[im 1/1]
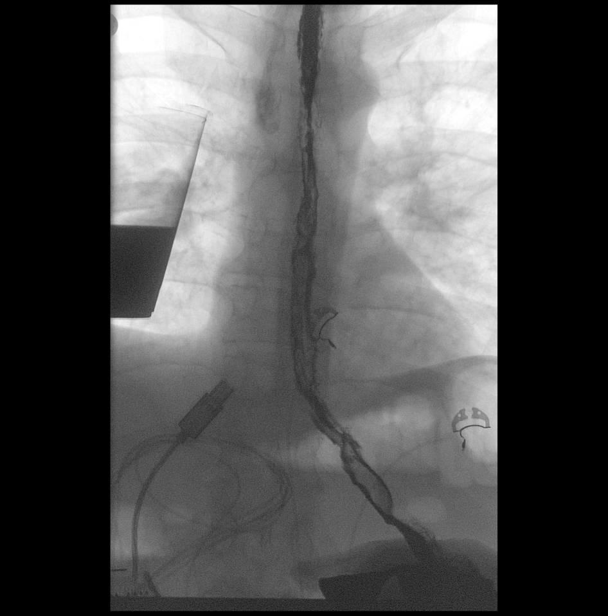

[Series 6: cp_standard · 0.25mm/px · 2 of 29 frames shown (5 of 9)]
[frame 5/29]
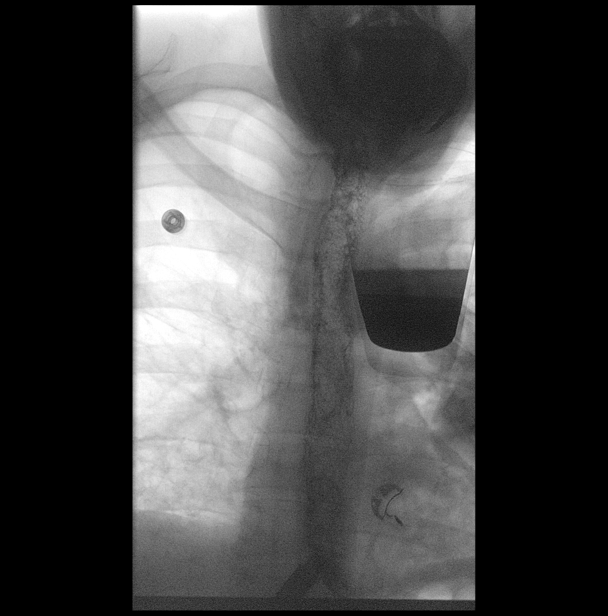
[frame 15/29]
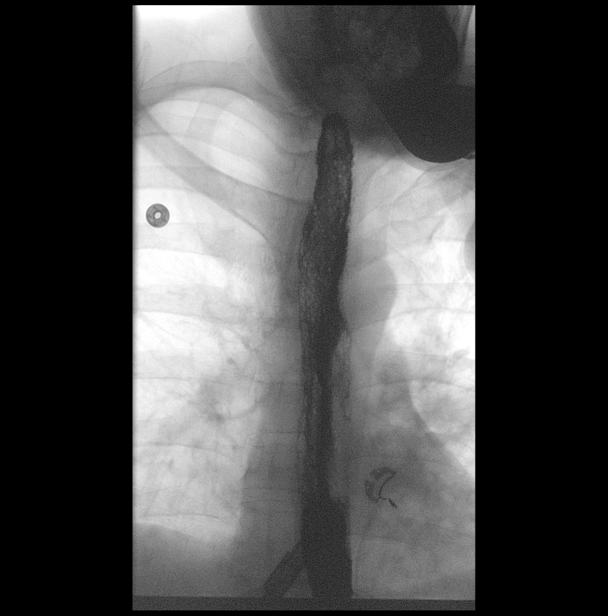

[Series 7: cp_standard · 0.26mm/px · 1 of 1 slices shown (6 of 9)]
[im 1/1]
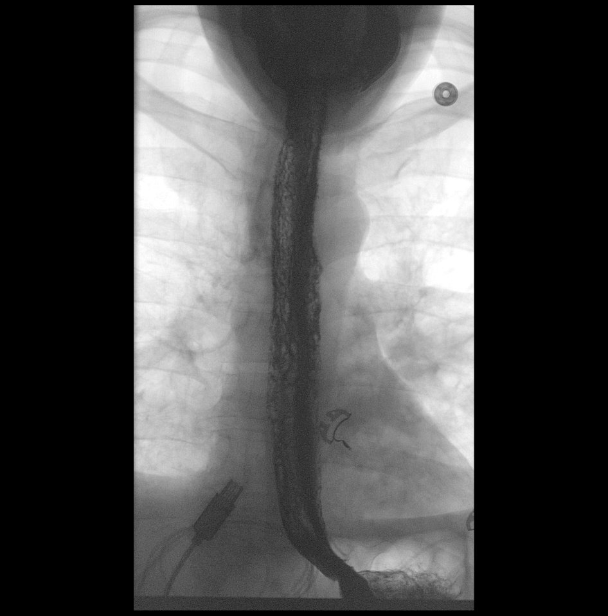

[Series 8: cp_standard · 0.26mm/px · 2 of 4 frames shown (7 of 9)]
[frame 3/4]
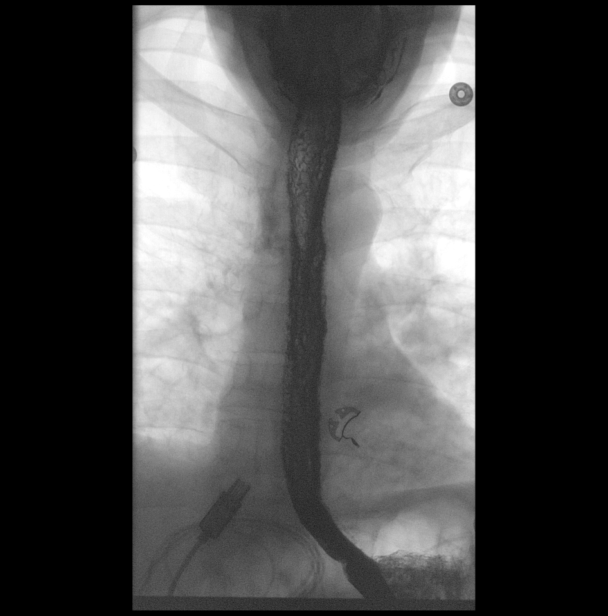
[frame 4/4]
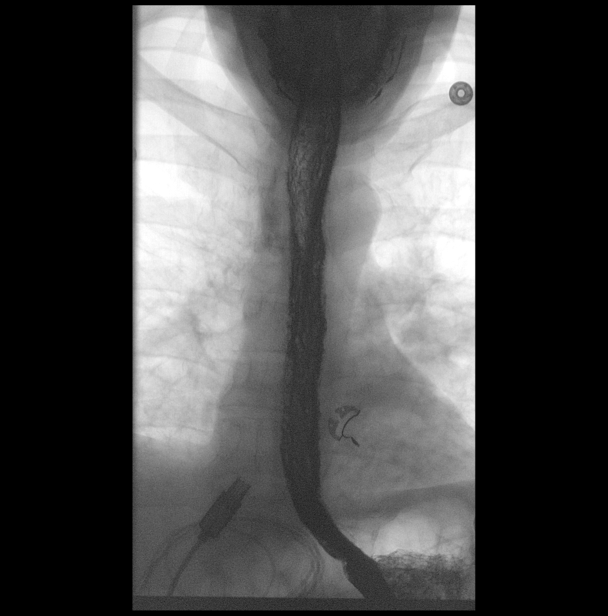

[Series 11: cp_standard · 0.27mm/px · 1 of 1 slices shown (8 of 9)]
[im 1/1]
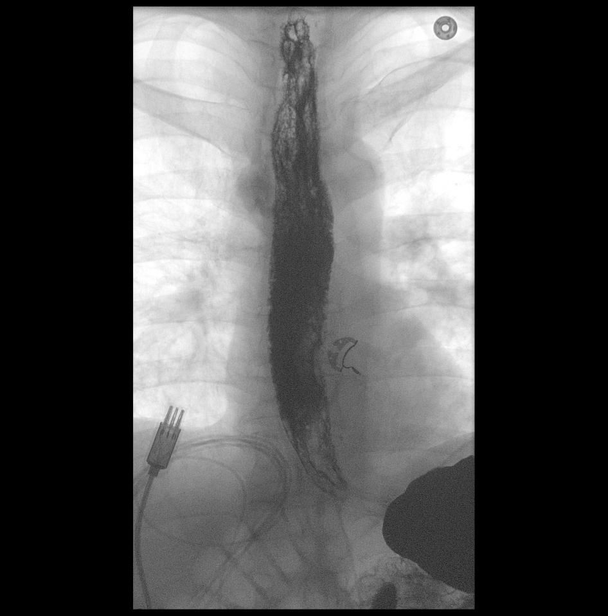

[Series 13: cp_standard · 0.28mm/px · 1 of 1 slices shown (9 of 9)]
[im 1/1]
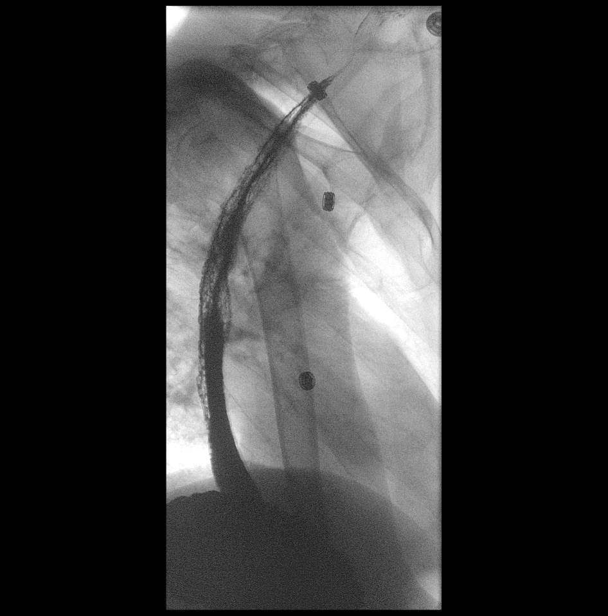

[14 of 23 positions shown; findings below may reference images not displayed]

FINDINGS: Normal pharyngeal anatomy and motility. Contrast flowed freely
through the esophagus without evidence of a stricture or mass.
Prominent longitudinal a oriented filling defects likely reflecting
mucosal plaques with severe irregularity of the mucosa. Shaggy
appearance of the esophagus likely reflecting small ulcers. Small
pseudodiverticula of the esophagus. Esophageal motility was normal.
Moderate severity gastroesophageal reflux. No definite hiatal hernia
was demonstrated.
IMPRESSION: 1. Severe irregularity of the mucosa with longitudinal plaques, with
tiny pseudodiverticula and small ulcerations likely reflecting
diffuse esophageal candidiasis.
2. Moderate severity gastroesophageal reflux.

## 2019-12-31 MED ORDER — MAGNESIUM SULFATE 2 GM/50ML IV SOLN
2.0000 g | Freq: Once | INTRAVENOUS | Status: AC
  Administered 2019-12-31: 2 g via INTRAVENOUS
  Filled 2019-12-31: qty 50

## 2019-12-31 MED ORDER — INSULIN GLARGINE 100 UNIT/ML ~~LOC~~ SOLN
5.0000 [IU] | Freq: Every day | SUBCUTANEOUS | Status: DC
  Administered 2019-12-31 – 2020-01-02 (×3): 5 [IU] via SUBCUTANEOUS
  Filled 2019-12-31 (×4): qty 0.05

## 2019-12-31 MED ORDER — FLUCONAZOLE 100 MG PO TABS
200.0000 mg | ORAL_TABLET | Freq: Every day | ORAL | Status: DC
  Administered 2019-12-31 – 2020-01-03 (×4): 200 mg via ORAL
  Filled 2019-12-31 (×4): qty 2

## 2019-12-31 MED ORDER — FLUCONAZOLE IN SODIUM CHLORIDE 400-0.9 MG/200ML-% IV SOLN
400.0000 mg | Freq: Once | INTRAVENOUS | Status: AC
  Administered 2019-12-31: 400 mg via INTRAVENOUS
  Filled 2019-12-31: qty 200

## 2019-12-31 MED ORDER — MUPIROCIN CALCIUM 2 % EX CREA
TOPICAL_CREAM | Freq: Every day | CUTANEOUS | Status: DC
  Filled 2019-12-31: qty 15

## 2019-12-31 NOTE — Progress Notes (Signed)
Gregory Moody ERX:[id number] DOB: [birth year] DOA: 12/27/2019 .  PCP: [place]  Patient coming from: Home  I have personally briefly reviewed patient's old medical records in [place]  Chief Complaint: Weakness  HPI: Gregory Moody is a 44 y.o. male with medical history significant for diabetes mellitus with complications of gastroparesis and chronic kidney disease who was brought into the emergency room for evaluation of generalized weakness. Per EMS, police were called to patient's home to serve a warrant on another resident. When they noted the condition the patient was living in, APS was contacted and they recommended the patient be brought to the ER for further evaluation. Patient states that he has not had anything to eat for at least 5 days and complained of nausea and vomiting but denied having any abdominal pain or changes in his bowel habits. Per EMS, he told them that he was not eating because he wanted to die but does not have any active suicide ideations at this time. Patient lives at home with his mother who he states is his caregiver. He complains of being very weak to ambulate for about 5 days. He denies having any chest pain, shortness of breath, orthopnea or paroxysmal nocturnal dyspnea. He denies being any fever, chills, headaches or any mental status changes  ED Course: Patient was referred to the emergency room by APS because of poor living conditions. Labs revealed significant hyperglycemia with an anion gap metabolic acidosis. He was also hypotensive but asymptomatic. He was started on an insulin drip and received IV fluid hydration. He will be admitted to the hospital for further evaluation .  12/28/19 Pt seen for f/u. His insulin ggt stopped at midnight and he is alert awake and denies any complaints.  12/29/19 Seen today for follow-up he is alert awake feeling better moving he has done quite with physical therapy his walked to the restroom  and back, his weakness has resolved.  But patient still complains of difficulty with swallowing. Chart review has showed that patient had a positive TB QuantiFERON in 2017, she was followed by therapy for 7 months per patient history.  Patient had a CT scan in May of this year which was concerning for pulmonary nodule with recommendations to repeat CT in a few months during hospitalization when he was evaluated by infectious disease And pulmonary and was discontinued off of any isolation precautions.  Discussed with patient that I am going to need to obtain imaging of his chest in addition to possibly a barium swallow for his odynophagia.  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  Pt today is alert and oriented and doing well. He is having diarrhea and is on po vancomycin for his c.diff. stool guaiac is pending because c./diff diarrhea will probably make fecal occult card heme positive.  D/W Pt that he can probably go home in 4-5 days. Pt verbalized understanding.  Labs today shows potassium is 3.2 magnesium is low at 1.5 ,hemoglobin is 7.9 we will transfuse.   12/31/19 Pt seen today he is feeling better , pt was given emperic diflucan 200 mg x 1 however after barium study and d/w radiologist about result fo extensive esophageal candidiasis he is given additional 200 mg to equate to total of 400 mg iv loading, following 10 additional days of 200 mg daily po. With first dose pt states he is able to swallow.and was requesting a regular consistency diet. Labs  today show falsely low sodium due to his high glucose of 237. Magnesium of 1.6 which we ave replaced.h/h is stable at 10.1.  Review of Systems: As per HPI otherwise 10 point review of systems negative.      Past Medical History:  Diagnosis Date  . COVID-19   . Diabetes mellitus without complication ([place])   . Gastroparesis   . Tuberculosis         Past Surgical History:   Procedure Laterality Date  . ESOPHAGOGASTRODUODENOSCOPY N/A 02/03/2019   Procedure: ESOPHAGOGASTRODUODENOSCOPY (EGD); Surgeon: Toledo, Benay Pike, MD; Location: [facility] ENDOSCOPY; Service: Gastroenterology; Laterality: N/A;   reports that he has been smoking. He has never used smokeless tobacco. He reports that he does not drink alcohol or use drugs.  No Known Allergies       Family History  Problem Relation Age of Onset  . Diabetes Mother   . Diabetes Father           Prior to Admission medications   Medication Sig Start Date End Date Taking? Authorizing Provider  insulin NPH-regular Human (70-30) 100 UNIT/ML injection Inject 20 Units into the skin 2 (two) times daily with a meal. 25 units with breakfast in am and 35 units with supper 04/03/19  Yes [name], MD  aspirin EC 81 MG tablet Take 81 mg by mouth daily.    [provider]  loperamide (IMODIUM A-D) 2 MG tablet Take 1 tablet (2 mg total) by mouth 4 (four) times daily as needed for diarrhea or loose stools.  Patient not taking: Reported on 12/27/2019 04/19/19   [name], MD  naproxen (NAPROSYN) 500 MG tablet Take 1 tablet (500 mg total) by mouth 2 (two) times daily with a meal.  Patient not taking: Reported on 12/27/2019 10/10/19   [name], MD  ondansetron (ZOFRAN) 4 MG tablet Take 1 tablet (4 mg total) by mouth every 6 (six) hours as needed for nausea.  Patient not taking: Reported on 12/27/2019 04/03/19   [name], MD  pantoprazole (PROTONIX) 40 MG tablet Take 1 tablet (40 mg total) by mouth 2 (two) times daily before a meal.  Patient not taking: Reported on 12/27/2019 04/03/19   [name], MD  Physical Exam:       Vitals:   12/27/19 1026 12/27/19 1028 12/27/19 1048  BP:  (!) 89/64   Pulse:  82   Resp:  18   Temp:   (!) 94.2 F (34.6 C)  TempSrc:   Rectal  SpO2:  95%   Weight: 45.4 kg    Height: _0  (1.676 m)          Vitals:   12/27/19 1026 12/27/19 1028  12/27/19 1048  BP:  (!) 89/64   Pulse:  82   Resp:  18   Temp:   (!) 94.2 F (34.6 C)  TempSrc:   Rectal  SpO2:  95%   Weight: 45.4 kg    Height: _1  (1.676 m)     Constitutional: NAD, alert and oriented x 3. Frail,Thin. Eyes: EOMI Respiratory: CLTABL cardiovascular: Regular rate and rhythm, no murmurs / rubs / gallops. No extremity edema. 2+ pedal pulses. No carotid bruits.  Abdomen: no tenderness, no masses palpated. No hepatosplenomegaly. Bowel sounds positive.  Musculoskeletal: no clubbing / cyanosis.  Skin: skin tears to both elbows and wrists of upper extremities, ulcers.  Neurologic: No gross focal neurologic deficit. Generalized weakness  Psychiatric: appropriate   Lab Results:  Results for orders placed  or performed during the hospital encounter of 12/27/19 (from the past 72 hour(s))  Glucose, capillary     Status: Abnormal   Collection Time: 12/28/19 10:17 PM  Result Value Ref Range   Glucose-Capillary 138 (H) 70 - 99 mg/dL    Comment: Glucose reference range applies only to samples taken after fasting for at least 8 hours.  Magnesium     Status: None   Collection Time: 12/29/19 12:12 AM  Result Value Ref Range   Magnesium 1.9 1.7 - 2.4 mg/dL    Comment: Performed at [facility], [address] [place], [place] [address]  Phosphorus     Status: Abnormal   Collection Time: 12/29/19 12:12 AM  Result Value Ref Range   Phosphorus 1.7 (L) 2.5 - 4.6 mg/dL    Comment: Performed at [facility], [address] [place], [place] [address]  Lipase, blood     Status: None   Collection Time: 12/29/19 12:12 AM  Result Value Ref Range   Lipase 22 11 - 51 U/L    Comment: Performed at [facility], [address] [place], [place] [address]  Hemoglobin A1c     Status: Abnormal   Collection Time: 12/29/19 12:12 AM  Result Value Ref Range   Hgb A1c MFr Bld 15.0 (H) 4.8 - 5.6 %    Comment: (NOTE) Pre diabetes:          5.7%-6.4% Diabetes:               >6.4% Glycemic control for   <7.0% adults with diabetes    Mean Plasma Glucose 383.8 mg/dL    Comment: Performed at [facility] [address], [place], [place] [address]  Iron and TIBC     Status: Abnormal   Collection Time: 12/29/19 12:12 AM  Result Value Ref Range   Iron 12 (L) 45 - 182 ug/dL   TIBC 144 (L) 250 - 450 ug/dL   Saturation Ratios 8 (L) 17.9 - 39.5 %   UIBC 132 ug/dL    Comment: Performed at [facility], [address] [place], [place] [address]  Ferritin     Status: None   Collection Time: 12/29/19 12:12 AM  Result Value Ref Range   Ferritin 166 24 - 336 ng/mL    Comment: Performed at [facility], [address] [place], [place] [address]  Reticulocytes     Status: Abnormal   Collection Time: 12/29/19 12:12 AM  Result Value Ref Range   Retic Ct Pct 1.6 0.4 - 3.1 %   RBC. 2.71 (L) 4.22 - 5.81 MIL/uL   Retic Count, Absolute 42.5 19.0 - 186.0 K/uL   Immature Retic Fract 9.0 2.3 - 15.9 %    Comment: Performed at [facility], [address] [place], [place] [address]  CBC with Differential/Platelet     Status: Abnormal   Collection Time: 12/29/19  8:29 AM  Result Value Ref Range   WBC 11.4 (H) 4.0 - 10.5 K/uL   RBC 2.66 (L) 4.22 - 5.81 MIL/uL   Hemoglobin 8.3 (L) 13.0 - 17.0 g/dL   HCT 24.3 (L) 39.0 - 52.0 %   MCV 91.4 80.0 - 100.0 fL   MCH 31.2 26.0 - 34.0 pg   MCHC 34.2 30.0 - 36.0 g/dL   RDW 12.8 11.5 - 15.5 %   Platelets 166 150 - 400 K/uL   nRBC 0.0 0.0 - 0.2 %   Neutrophils Relative % 89 %  Neutro Abs 10.1 (H) 1.7 - 7.7 K/uL   Lymphocytes Relative 7 %   Lymphs Abs 0.9 0.7 - 4.0 K/uL   Monocytes Relative 3 %   Monocytes Absolute 0.3 0.1 - 1.0 K/uL   Eosinophils Relative 1 %   Eosinophils Absolute 0.1 0.0 - 0.5 K/uL   Basophils Relative 0 %   Basophils Absolute 0.0 0.0 - 0.1 K/uL   Immature Granulocytes 0 %   Abs Immature Granulocytes 0.05 0.00 - 0.07 K/uL    Comment: Performed at [facility], [address] [place], [place] [address]  Comprehensive metabolic panel     Status: Abnormal   Collection Time: 12/29/19  8:29 AM  Result Value Ref Range   Sodium 133 (L) 135 - 145 mmol/L   Potassium 3.7 3.5 - 5.1 mmol/L   Chloride 105 98 - 111 mmol/L   CO2 21 (L) 22 - 32 mmol/L   Glucose, Bld 221 (H) 70 - 99 mg/dL    Comment: Glucose reference range applies only to samples taken after fasting for at least 8 hours.   BUN 27 (H) 6 - 20 mg/dL   Creatinine, Ser 1.23 0.61 - 1.24 mg/dL   Calcium 8.0 (L) 8.9 - 10.3 mg/dL   Total Protein 5.4 (L) 6.5 - 8.1 g/dL   Albumin 2.3 (L) 3.5 - 5.0 g/dL   AST 10 (L) 15 - 41 U/L   ALT 7 0 - 44 U/L   Alkaline Phosphatase 59 38 - 126 U/L   Total Bilirubin 0.6 0.3 - 1.2 mg/dL   GFR calc non Af Amer >60 >60 mL/min   GFR calc Af Amer >60 >60 mL/min   Anion gap 7 5 - 15    Comment: Performed at [facility], [address] [place], [place] [address]  Magnesium     Status: None   Collection Time: 12/29/19  8:29 AM  Result Value Ref Range   Magnesium 1.9 1.7 - 2.4 mg/dL    Comment: Performed at [facility], [address] [place], [place] [address]  Phosphorus     Status: None   Collection Time: 12/29/19  8:29 AM  Result Value Ref Range   Phosphorus 2.9 2.5 - 4.6 mg/dL    Comment: Performed at [facility], [address] [place], [place] [address]  Fibrin derivatives D-[facility] only)     Status: Abnormal   Collection Time: 12/29/19 11:21 AM  Result Value Ref Range   Fibrin derivatives D-dimer ([facility]) 1,124.50 (H) 0.00 - 499.00 ng/mL (FEU)    Comment: (NOTE) <> Exclusion of Venous Thromboembolism (VTE) - OUTPATIENT ONLY   (Emergency Department or [place])   0-499 ng/ml (FEU): With a low to intermediate pretest probability                      for VTE this test result excludes the diagnosis                      of VTE.   >499 ng/ml (FEU) : VTE not excluded; additional work up for VTE is                       required. <> Testing on Inpatients and Evaluation of Disseminated Intravascular   Coagulation (DIC) Reference Range:   0-499 ng/ml (FEU) Performed at [facility], [address] [place], [place] [address]   Glucose, capillary  Status: Abnormal   Collection Time: 12/29/19 11:35 AM  Result Value Ref Range   Glucose-Capillary 255 (H) 70 - 99 mg/dL    Comment: Glucose reference range applies only to samples taken after fasting for at least 8 hours.  Glucose, capillary     Status: Abnormal   Collection Time: 12/29/19  4:30 PM  Result Value Ref Range   Glucose-Capillary 109 (H) 70 - 99 mg/dL    Comment: Glucose reference range applies only to samples taken after fasting for at least 8 hours.  Glucose, capillary     Status: Abnormal   Collection Time: 12/29/19 11:24 PM  Result Value Ref Range   Glucose-Capillary 58 (L) 70 - 99 mg/dL    Comment: Glucose reference range applies only to samples taken after fasting for at least 8 hours.  Glucose, capillary     Status: Abnormal   Collection Time: 12/29/19 11:47 PM  Result Value Ref Range   Glucose-Capillary 205 (H) 70 - 99 mg/dL    Comment: Glucose reference range applies only to samples taken after fasting for at least 8 hours.  Glucose, capillary     Status: Abnormal   Collection Time: 12/30/19  2:08 AM  Result Value Ref Range   Glucose-Capillary 175 (H) 70 - 99 mg/dL    Comment: Glucose reference range applies only to samples taken after fasting for at least 8 hours.  Magnesium     Status: Abnormal   Collection Time: 12/30/19  4:31 AM  Result Value Ref Range   Magnesium 1.5 (L) 1.7 - 2.4 mg/dL    Comment: Performed at [facility], [address] [place], [place] [address]  Phosphorus     Status: Abnormal   Collection Time: 12/30/19  4:31 AM  Result Value Ref Range   Phosphorus 2.1 (L) 2.5 - 4.6 mg/dL    Comment: Performed at [facility], [address] [place], [place] [address]  CBC with  Differential/Platelet     Status: Abnormal   Collection Time: 12/30/19  4:31 AM  Result Value Ref Range   WBC 10.0 4.0 - 10.5 K/uL   RBC 2.48 (L) 4.22 - 5.81 MIL/uL   Hemoglobin 7.9 (L) 13.0 - 17.0 g/dL   HCT 22.8 (L) 39.0 - 52.0 %   MCV 91.9 80.0 - 100.0 fL   MCH 31.9 26.0 - 34.0 pg   MCHC 34.6 30.0 - 36.0 g/dL   RDW 12.6 11.5 - 15.5 %   Platelets 156 150 - 400 K/uL   nRBC 0.0 0.0 - 0.2 %   Neutrophils Relative % 82 %   Neutro Abs 8.2 (H) 1.7 - 7.7 K/uL   Lymphocytes Relative 14 %   Lymphs Abs 1.4 0.7 - 4.0 K/uL   Monocytes Relative 3 %   Monocytes Absolute 0.3 0.1 - 1.0 K/uL   Eosinophils Relative 0 %   Eosinophils Absolute 0.0 0.0 - 0.5 K/uL   Basophils Relative 0 %   Basophils Absolute 0.0 0.0 - 0.1 K/uL   Immature Granulocytes 1 %   Abs Immature Granulocytes 0.05 0.00 - 0.07 K/uL    Comment: Performed at [facility], [address] [place], [place] [address]  Comprehensive metabolic panel     Status: Abnormal   Collection Time: 12/30/19  4:31 AM  Result Value Ref Range   Sodium 135 135 - 145 mmol/L   Potassium 3.2 (L) 3.5 - 5.1 mmol/L   Chloride 102 98 - 111 mmol/L   CO2 23  22 - 32 mmol/L   Glucose, Bld 159 (H) 70 - 99 mg/dL    Comment: Glucose reference range applies only to samples taken after fasting for at least 8 hours.   BUN 17 6 - 20 mg/dL   Creatinine, Ser 1.00 0.61 - 1.24 mg/dL   Calcium 7.7 (L) 8.9 - 10.3 mg/dL   Total Protein 4.9 (L) 6.5 - 8.1 g/dL   Albumin 2.0 (L) 3.5 - 5.0 g/dL   AST 11 (L) 15 - 41 U/L   ALT 5 0 - 44 U/L   Alkaline Phosphatase 59 38 - 126 U/L   Total Bilirubin 0.7 0.3 - 1.2 mg/dL   GFR calc non Af Amer >60 >60 mL/min   GFR calc Af Amer >60 >60 mL/min   Anion gap 10 5 - 15    Comment: Performed at [facility], [address] [place], [place] [address]  C-reactive protein     Status: Abnormal   Collection Time: 12/30/19  4:31 AM  Result Value Ref Range   CRP 9.4 (H) <1.0 mg/dL    Comment: Performed at [facility] [address] [place], [place] [address]  Glucose, capillary     Status: Abnormal   Collection Time: 12/30/19  5:41 AM  Result Value Ref Range   Glucose-Capillary 139 (H) 70 - 99 mg/dL    Comment: Glucose reference range applies only to samples taken after fasting for at least 8 hours.  Glucose, capillary     Status: Abnormal   Collection Time: 12/30/19  8:28 AM  Result Value Ref Range   Glucose-Capillary 161 (H) 70 - 99 mg/dL    Comment: Glucose reference range applies only to samples taken after fasting for at least 8 hours.  Glucose, capillary     Status: Abnormal   Collection Time: 12/30/19 11:27 AM  Result Value Ref Range   Glucose-Capillary 131 (H) 70 - 99 mg/dL    Comment: Glucose reference range applies only to samples taken after fasting for at least 8 hours.  Prepare RBC (crossmatch)     Status: None   Collection Time: 12/30/19  2:55 PM  Result Value Ref Range   Order Confirmation      ORDER PROCESSED BY BLOOD BANK Performed at [facility], [address] [place], [place] [address]   Glucose, capillary     Status: Abnormal   Collection Time: 12/30/19  4:38 PM  Result Value Ref Range   Glucose-Capillary 165 (H) 70 - 99 mg/dL    Comment: Glucose reference range applies only to samples taken after fasting for at least 8 hours.  Prepare RBC (crossmatch)     Status: None   Collection Time: 12/30/19  8:00 PM  Result Value Ref Range   Order Confirmation      DUPLICATE REQUEST Performed at [facility], [address] [place], [place] [address]   Glucose, capillary     Status: Abnormal   Collection Time: 12/30/19  9:14 PM  Result Value Ref Range   Glucose-Capillary 162 (H) 70 - 99 mg/dL    Comment: Glucose reference range applies only to samples taken after fasting for at least 8 hours.   Comment 1 Notify RN   Magnesium     Status: Abnormal   Collection Time: 12/31/19  3:14 AM  Result Value Ref Range   Magnesium 1.6 (L) 1.7 - 2.4  mg/dL    Comment: Performed at [facility], [address],  [place], [place] [address]  Phosphorus     Status: None   Collection Time: 12/31/19  3:14 AM  Result Value Ref Range   Phosphorus 2.9 2.5 - 4.6 mg/dL    Comment: Performed at [facility] [address] [place], [place] [address]  CBC with Differential/Platelet     Status: Abnormal   Collection Time: 12/31/19  3:14 AM  Result Value Ref Range   WBC 9.0 4.0 - 10.5 K/uL   RBC 3.39 (L) 4.22 - 5.81 MIL/uL   Hemoglobin 10.1 (L) 13.0 - 17.0 g/dL    Comment: REPEATED TO VERIFY   HCT 30.3 (L) 39.0 - 52.0 %   MCV 89.4 80.0 - 100.0 fL   MCH 29.8 26.0 - 34.0 pg   MCHC 33.3 30.0 - 36.0 g/dL   RDW 13.2 11.5 - 15.5 %   Platelets 178 150 - 400 K/uL   nRBC 0.0 0.0 - 0.2 %   Neutrophils Relative % 79 %   Neutro Abs 7.1 1.7 - 7.7 K/uL   Lymphocytes Relative 15 %   Lymphs Abs 1.4 0.7 - 4.0 K/uL   Monocytes Relative 5 %   Monocytes Absolute 0.4 0.1 - 1.0 K/uL   Eosinophils Relative 1 %   Eosinophils Absolute 0.1 0.0 - 0.5 K/uL   Basophils Relative 0 %   Basophils Absolute 0.0 0.0 - 0.1 K/uL   Immature Granulocytes 0 %   Abs Immature Granulocytes 0.02 0.00 - 0.07 K/uL    Comment: Performed at [facility], [address] [place], [place] [address]  Comprehensive metabolic panel     Status: Abnormal   Collection Time: 12/31/19  3:14 AM  Result Value Ref Range   Sodium 131 (L) 135 - 145 mmol/L   Potassium 3.7 3.5 - 5.1 mmol/L   Chloride 101 98 - 111 mmol/L   CO2 23 22 - 32 mmol/L   Glucose, Bld 237 (H) 70 - 99 mg/dL    Comment: Glucose reference range applies only to samples taken after fasting for at least 8 hours.   BUN 13 6 - 20 mg/dL   Creatinine, Ser 0.97 0.61 - 1.24 mg/dL   Calcium 7.6 (L) 8.9 - 10.3 mg/dL   Total Protein 5.5 (L) 6.5 - 8.1 g/dL   Albumin 2.3 (L) 3.5 - 5.0 g/dL   AST 11 (L) 15 - 41 U/L   ALT 7 0 - 44 U/L   Alkaline Phosphatase 66 38 - 126 U/L   Total Bilirubin 2.4 (H) 0.3 -  1.2 mg/dL   GFR calc non Af Amer >60 >60 mL/min   GFR calc Af Amer >60 >60 mL/min   Anion gap 7 5 - 15    Comment: Performed at [facility], [address] [place], [place] [address]  C-reactive protein     Status: Abnormal   Collection Time: 12/31/19  3:14 AM  Result Value Ref Range   CRP 10.2 (H) <1.0 mg/dL    Comment: Performed at [facility] [address] [place], [place] [address]  Glucose, capillary     Status: Abnormal   Collection Time: 12/31/19  8:07 AM  Result Value Ref Range   Glucose-Capillary 201 (H) 70 - 99 mg/dL    Comment: Glucose reference range applies only to samples taken after fasting for at least 8 hours.   Comment 1 Notify RN   Glucose, capillary     Status: Abnormal   Collection Time: 12/31/19 11:43 AM  Result  Value Ref Range   Glucose-Capillary 219 (H) 70 - 99 mg/dL    Comment: Glucose reference range applies only to samples taken after fasting for at least 8 hours.   Comment 1 Notify RN   Glucose, capillary     Status: Abnormal   Collection Time: 12/31/19  4:32 PM  Result Value Ref Range   Glucose-Capillary 126 (H) 70 - 99 mg/dL    Comment: Glucose reference range applies only to samples taken after fasting for at least 8 hours.   Comment 1 Notify RN     Radiological Exams on Admission:  Imaging Results (Last 48 hours)  DG Chest Portable 1 View  Result Date: 12/27/2019  CLINICAL DATA: Weakness EXAM: PORTABLE CHEST 1 VIEW COMPARISON: Chest radiographs, 11/18/2019, CT chest, 02/13/2019 FINDINGS: The heart size and mediastinal contours are within normal limits. Unchanged nodule of the peripheral right upper lobe. The visualized skeletal structures are unremarkable. IMPRESSION: No acute abnormality of the lungs in AP portable projection. Unchanged nodule of the peripheral right upper lobe, better evaluated by prior CT. Electronically Signed By: Eddie Candle M.D. On: 12/27/2019 10:47    EKG: Independently reviewed. Sinus rhythm  Premature  ventricular complexes   Assessment/Plan:  Odynophagia: Barium swallow Monday.  Esophageal candidiasis: Pt given total of iv fluconazole 400 mg x 1 Followed by po fluconazole 200 mg daily for 10 days.   History of TB/right upper lobe nodule Repeat CT shows multilobar pneumonia bilaterally, with concern for atypical bacterial including mycobacterial TB active infection.  Due to elevation of D-dimer will also consider CTA of the chest tomorrow to rule out any VTE's now that patient's AKI has resolved. Pt to get 5 days of azithromycin.   Diabetes mellitus type II Continue with sliding scale insulin, Accu-Cheks, hypoglycemia protocol. Lantus started 5 units at bedtime.  We will titrate dose and increase To twice daily dosing as  patient's hemoglobin A1c is 15. Home medications with long-acting insulin held.  Acute kidney injury Resolved  Depression Patient verbalized not wanting to live and had been starving himself  Has no active suicidal ideation at this time  Will consult psychaitry   Protein calorie malnutrition (Severe) Will consult dietician.  Patient will benefit from nutritional supplements.   Anemia of chronic disease Stable .  C.Diff Diarrhea We will start pt on po vancomycin.  Diarrhea frequency has decreased rocephin d/c azithromycin continued for ? CAP.  Poor living condition Patient was referred to the ER by APS because of poor living conditions. Patient may need alternative living conditions upon discharge.  DVT prophylaxis: Heparin.  Code Status: Full code  Family Communication: Plan of care was discussed with patient at the bedside. He verbalizes understanding and agrees with the plan  Disposition Plan: d/c home after  Consults called: Case management, Psychaitry , neurology, Pulmonary,

## 2019-12-31 NOTE — Progress Notes (Signed)
PT Cancellation Note  Patient Details Name: Gregory Moody MRN: [id number] DOB: 10-05-1976   Cancelled Treatment:    Reason Eval/Treat Not Completed: Other (comment). Pt currently out of room at this time. Not available for treatment. Will re-attempt next available date.   [name] 12/31/2019, 9:53 AM [name], PT, DPT [phone]

## 2019-12-31 NOTE — Progress Notes (Signed)
Inpatient Diabetes Program Recommendations  AACE/ADA: New Consensus Statement on Inpatient Glycemic Control   Target Ranges:  Prepandial:   less than 140 mg/dL      Peak postprandial:   less than 180 mg/dL (1-2 hours)      Critically ill patients:  140 - 180 mg/dL  Results for OLUMIDE, DOLINGER (MRN [id number]) as of 12/31/2019 08:23  Ref. Range 12/30/2019 08:28 12/30/2019 11:27 12/30/2019 16:38 12/30/2019 21:14 12/31/2019 08:07  Glucose-Capillary Latest Ref Range: 70 - 99 mg/dL 161 (H) 131 (H) 165 (H) 162 (H) 201 (H)    Review of Glycemic Control  Outpatient Diabetes medications:70/30 25 units QAM, 70/30 35 units QPM Current orders for Inpatient glycemic control:Lantus 10 units QHS, Novolog 0-6 units TID with meals, Novolog 2 units TID with meals for meal coverage  Inpatient Diabetes Program Recommendations:    Insulin-Basal: Lantus was NOT GIVEN on 12/29/19 nor on 12/30/19.  Fasting glucose up to 201 mg/dl this morning. Please change Lantus to 5 units daily.  Thanks, Barnie Alderman, RN, MSN, CDE Diabetes Coordinator Inpatient Diabetes Program [phone] (Team Pager from 8am to 5pm)

## 2019-12-31 NOTE — Consult Note (Signed)
WOC Nurse Consult Note: Reason for Consult: Partial thickness wounds to elbows and knees.  Self neglect and depression Wound type:partial thickness trauma wounds/abrasions to knees and elbows Pressure Injury POA: NA Measurement: 1 cm round abrasions with pink moist center Wound NXG:ZFPO and moist Drainage (amount, consistency, odor) minimal serosanguinous  No odor.  Periwound:erythema Dressing procedure/placement/frequency:Cleanse abrasions to knees and elbows with NS and pat dry.  Apply mupirocin ointment to wound bed.  Cover with silicone foam.  Peel back daily and re-apply.  Change foam every three days and PRn soilage.   Will not follow at this time.  Please re-consult if needed.  Domenic Moras MSN, RN, FNP-BC CWON Wound, Ostomy, Continence Nurse Pager [phone]

## 2019-12-31 NOTE — TOC Progression Note (Signed)
Transition of Care [facility] - Progression Note    Patient Details  Name: Gregory Moody MRN: [id number] Date of Birth: Nov 11, 1975  Transition of [facility]) CM/SW Contact  Shade Flood, LCSW Phone Number: 12/31/2019, 12:06 PM  Clinical Narrative:     TOC following. Updated given to [place] worker, Otila Kluver, today. Per Otila Kluver, she will be coming to see patient tomorrow. Updated pt's RN.  Will follow up in AM.       Expected Discharge Plan and Services                                                 Social Determinants of Health (SDOH) Interventions    Readmission Risk Interventions Readmission Risk Prevention Plan 04/19/2019 03/07/2019  Transportation Screening Complete Complete  PCP or Specialist Appt within 3-5 Days - Complete  Social Work Consult for [place] Planning/Counseling - (No Data)  Palliative Care Screening - Not Applicable  Medication Review Press photographer) Complete Complete  PCP or Specialist appointment within 3-5 days of discharge Complete -  [place] or Home Care Consult Complete -  SW Recovery Care/Counseling Consult Complete -  Palliative Care Screening Not Applicable -  [place] Not Applicable -  Some recent data might be hidden

## 2020-01-01 DIAGNOSIS — R1310 Dysphagia, unspecified: Secondary | ICD-10-CM

## 2020-01-01 LAB — COMPREHENSIVE METABOLIC PANEL
ALT: 6 U/L (ref 0–44)
AST: 9 U/L — ABNORMAL LOW (ref 15–41)
Albumin: 2 g/dL — ABNORMAL LOW (ref 3.5–5.0)
Alkaline Phosphatase: 59 U/L (ref 38–126)
Anion gap: 7 (ref 5–15)
BUN: 10 mg/dL (ref 6–20)
CO2: 23 mmol/L (ref 22–32)
Calcium: 7.4 mg/dL — ABNORMAL LOW (ref 8.9–10.3)
Chloride: 101 mmol/L (ref 98–111)
Creatinine, Ser: 0.84 mg/dL (ref 0.61–1.24)
GFR calc Af Amer: >60 mL/min (ref >60)
GFR calc non Af Amer: >60 mL/min (ref >60)
Glucose, Bld: 122 mg/dL — ABNORMAL HIGH (ref 70–99)
Potassium: 3.4 mmol/L — ABNORMAL LOW (ref 3.5–5.1)
Sodium: 131 mmol/L — ABNORMAL LOW (ref 135–145)
Total Bilirubin: 0.9 mg/dL (ref 0.3–1.2)
Total Protein: 5.1 g/dL — ABNORMAL LOW (ref 6.5–8.1)

## 2020-01-01 LAB — CBC WITH DIFFERENTIAL/PLATELET
Abs Immature Granulocytes: 0.01 10*3/uL (ref 0.00–0.07)
Basophils Absolute: 0 10*3/uL (ref 0.0–0.1)
Basophils Relative: 0 %
Eosinophils Absolute: 0.1 10*3/uL (ref 0.0–0.5)
Eosinophils Relative: 1 %
HCT: 29 % — ABNORMAL LOW (ref 39.0–52.0)
Hemoglobin: 10 g/dL — ABNORMAL LOW (ref 13.0–17.0)
Immature Granulocytes: 0 %
Lymphocytes Relative: 26 %
Lymphs Abs: 1.5 10*3/uL (ref 0.7–4.0)
MCH: 30.4 pg (ref 26.0–34.0)
MCHC: 34.5 g/dL (ref 30.0–36.0)
MCV: 88.1 fL (ref 80.0–100.0)
Monocytes Absolute: 0.4 10*3/uL (ref 0.1–1.0)
Monocytes Relative: 7 %
Neutro Abs: 3.7 10*3/uL (ref 1.7–7.7)
Neutrophils Relative %: 66 %
Platelets: 164 10*3/uL (ref 150–400)
RBC: 3.29 MIL/uL — ABNORMAL LOW (ref 4.22–5.81)
RDW: 13.7 % (ref 11.5–15.5)
WBC: 5.6 10*3/uL (ref 4.0–10.5)
nRBC: 0 % (ref 0.0–0.2)

## 2020-01-01 LAB — GLUCOSE, CAPILLARY
Glucose-Capillary: 100 mg/dL — ABNORMAL HIGH (ref 70–99)
Glucose-Capillary: 84 mg/dL (ref 70–99)
Glucose-Capillary: 137 mg/dL — ABNORMAL HIGH (ref 70–99)
Glucose-Capillary: 90 mg/dL (ref 70–99)
Glucose-Capillary: 133 mg/dL — ABNORMAL HIGH (ref 70–99)

## 2020-01-01 LAB — TYPE AND SCREEN
ABO/RH(D): O POS
Antibody Screen: NEGATIVE
Unit division: 0
Unit division: 0

## 2020-01-01 LAB — CULTURE, BLOOD (ROUTINE X 2)
Culture: NO GROWTH
Special Requests: ADEQUATE

## 2020-01-01 LAB — C-REACTIVE PROTEIN: CRP: 7.4 mg/dL — ABNORMAL HIGH (ref <1.0)

## 2020-01-01 LAB — BPAM RBC
Blood Product Expiration Date: 202103232359
Blood Product Expiration Date: 202104142359
ISSUE DATE / TIME: 202103212051
Unit Type and Rh: 5100
Unit Type and Rh: 9500

## 2020-01-01 LAB — PHOSPHORUS: Phosphorus: 2.2 mg/dL — ABNORMAL LOW (ref 2.5–4.6)

## 2020-01-01 LAB — MAGNESIUM: Magnesium: 1.8 mg/dL (ref 1.7–2.4)

## 2020-01-01 MED ORDER — MAGNESIUM SULFATE 2 GM/50ML IV SOLN
2.0000 g | Freq: Once | INTRAVENOUS | Status: AC
  Administered 2020-01-01: 2 g via INTRAVENOUS
  Filled 2020-01-01: qty 50

## 2020-01-01 MED ORDER — K PHOS MONO-SOD PHOS DI & MONO 155-852-130 MG PO TABS
500.0000 mg | ORAL_TABLET | Freq: Three times a day (TID) | ORAL | Status: AC
  Administered 2020-01-01 (×3): 500 mg via ORAL
  Filled 2020-01-01 (×3): qty 2

## 2020-01-01 NOTE — Progress Notes (Signed)
OT Cancellation Note  Patient Details Name: Gregory Moody MRN: [id number] DOB: Jan 15, 1976   Cancelled Treatment:    Reason Eval/Treat Not Completed: Patient declined, no reason specified;Other (comment)   Patient approached in afternoon for OT session.  Patient declining treatment at this time but thankful for therapist checking on him.  Will follow up as appropriate and able.  Thank you.  Oren Binet 01/01/2020, 4:37 PM

## 2020-01-01 NOTE — TOC Progression Note (Signed)
Transition of Care [facility]) - Progression Note    Patient Details  Name: Duglas Heier MRN: [id number] Date of Birth: July 28, 1976  Transition of [facility]) CM/SW Contact  Shade Flood, LCSW Phone Number: 01/01/2020, 2:57 PM  Clinical Narrative:     TOC following. Spoke with APS worker, Otila Kluver, this afternoon. She has not been able to get here yet to see pt though she states she is going to try and get here today yet before 5:00. She states that if she can't, she will come tomorrow AM.   Unclear if pt will still require inpatient Psychiatry placement at dc. Otila Kluver also needs to determine if pt is safe to return home at dc if Psychiatry placement is not pursued.   DC timeframe not yet known.  Assigned TOC will follow.       Expected Discharge Plan and Services                                                 Social Determinants of Health (SDOH) Interventions    Readmission Risk Interventions Readmission Risk Prevention Plan 04/19/2019 03/07/2019  Transportation Screening Complete Complete  PCP or Specialist Appt within 3-5 Days - Complete  Social Work Consult for [place] Planning/Counseling - (No Data)  Palliative Care Screening - Not Applicable  Medication Review Press photographer) Complete Complete  PCP or Specialist appointment within 3-5 days of discharge Complete -  [place] or Home Care Consult Complete -  SW Recovery Care/Counseling Consult Complete -  Palliative Care Screening Not Applicable -  [place] Not Applicable -  Some recent data might be hidden

## 2020-01-01 NOTE — Progress Notes (Signed)
Physical Therapy Treatment Patient Details Name: Gregory Moody MRN: [id number] DOB: 1976-06-15 Today's Date: 01/01/2020    History of Present Illness  44 y.o. male with medical history significant for diabetes mellitus with complications of gastroparesis and chronic kidney disease who was brought into the emergency room for evaluation of generalized weakness. Per EMS, police were called to patient's home to serve a warrant on another resident. When they noted the condition the patient was living in, APS was contacted and they recommended the patient be brought to the ER for further evaluation. Patient states that he has not had anything to eat for at least 5 days prior to coming in and complained of nausea and vomiting.    PT Comments    Pt easily woken, agreeable to PT. Pt demonstrated bed mobility independently, and transferred with and without the RW. Pt preferred to utilize the RW this session, though may not have been required. Pt exhibited some lateral leaning while ambulating, but no LOB noted, no physical assistance needed, supervision. The patient was able to ambulate in the room ~24ft, little complaints of fatigue but overall did well. Current recommendation remains appropriate.     Follow Up Recommendations  Home health PT;Supervision - Intermittent     Equipment Recommendations  None recommended by PT    Recommendations for Other Services       Precautions / Restrictions Precautions Precautions: Fall Restrictions Weight Bearing Restrictions: No    Mobility  Bed Mobility Overal bed mobility: Independent                Transfers Overall transfer level: Modified independent Equipment used: Rolling walker (2 wheeled)                Ambulation/Gait Ambulation/Gait assistance: Supervision Gait Distance (Feet): 80 Feet Assistive device: Rolling walker (2 wheeled)       General Gait Details: Pt preferred to utilize walker, though may not have  been necessary.   Stairs             Wheelchair Mobility    Modified Rankin (Stroke Patients Only)       Balance Overall balance assessment: Needs assistance Sitting-balance support: No upper extremity supported Sitting balance-Leahy Scale: Good     Standing balance support: No upper extremity supported Standing balance-Leahy Scale: Fair Standing balance comment: Pt with some posterior lean with eyes closed static standing, able to with stand only minimal perturbations with wide BOS.                            Cognition Arousal/Alertness: Awake/alert Behavior During Therapy: WFL for tasks assessed/performed Overall Cognitive Status: Within Functional Limits for tasks assessed                                        Exercises      General Comments        Pertinent Vitals/Pain Pain Assessment: No/denies pain    Home Living                      Prior Function            PT Goals (current goals can now be found in the care plan section) Progress towards PT goals: Progressing toward goals    Frequency    Min 2X/week      PT Plan  Current plan remains appropriate    Co-evaluation              AM-PAC PT "6 Clicks" Mobility   Outcome Measure  Help needed turning from your back to your side while in a flat bed without using bedrails?: None Help needed moving from lying on your back to sitting on the side of a flat bed without using bedrails?: None Help needed moving to and from a bed to a chair (including a wheelchair)?: None Help needed standing up from a chair using your arms (e.g., wheelchair or bedside chair)?: None Help needed to walk in hospital room?: None Help needed climbing 3-5 steps with a railing? : None 6 Click Score: 24    End of Session Equipment Utilized During Treatment: Gait belt Activity Tolerance: Patient tolerated treatment well Patient left: with chair alarm set;with call bell/phone  within reach Nurse Communication: Mobility status PT Visit Diagnosis: Unsteadiness on feet (R26.81);Muscle weakness (generalized) (M62.81)     Time: 6389-3734 PT Time Calculation (min) (ACUTE ONLY): 13 min  Charges:  $Therapeutic Exercise: 8-22 mins                     Lieutenant Diego PT, DPT 10:39 AM,01/01/20

## 2020-01-01 NOTE — Plan of Care (Signed)
  Problem: Education: Goal: Ability to describe self-care measures that may prevent or decrease complications (Diabetes Survival Skills Education) will improve Outcome: Progressing Goal: Individualized Educational Video(s) Outcome: Progressing   Problem: Cardiac: Goal: Ability to maintain an adequate cardiac output will improve Outcome: Progressing   Problem: Health Behavior/Discharge Planning: Goal: Ability to identify and utilize available resources and services will improve Outcome: Progressing Goal: Ability to manage health-related needs will improve Outcome: Progressing   Problem: Fluid Volume: Goal: Ability to achieve a balanced intake and output will improve Outcome: Progressing   Problem: Metabolic: Goal: Ability to maintain appropriate glucose levels will improve Outcome: Progressing   Problem: Nutritional: Goal: Maintenance of adequate nutrition will improve Outcome: Progressing Goal: Maintenance of adequate weight for body size and type will improve Outcome: Progressing   Problem: Respiratory: Goal: Will regain and/or maintain adequate ventilation Outcome: Progressing   Problem: Urinary Elimination: Goal: Ability to achieve and maintain adequate renal perfusion and functioning will improve Outcome: Progressing   Problem: Education: Goal: Knowledge of General Education information will improve Description: Including pain rating scale, medication(s)/side effects and non-pharmacologic comfort measures Outcome: Progressing   Problem: Health Behavior/Discharge Planning: Goal: Ability to manage health-related needs will improve Outcome: Progressing   Problem: Clinical Measurements: Goal: Ability to maintain clinical measurements within normal limits will improve Outcome: Progressing Goal: Will remain free from infection Outcome: Progressing Goal: Diagnostic test results will improve Outcome: Progressing Goal: Respiratory complications will improve Outcome:  Progressing Goal: Cardiovascular complication will be avoided Outcome: Progressing   Problem: Activity: Goal: Risk for activity intolerance will decrease Outcome: Progressing   Problem: Nutrition: Goal: Adequate nutrition will be maintained Outcome: Progressing   Problem: Coping: Goal: Level of anxiety will decrease Outcome: Progressing   Problem: Elimination: Goal: Will not experience complications related to bowel motility Outcome: Progressing Goal: Will not experience complications related to urinary retention Outcome: Progressing   Problem: Safety: Goal: Ability to remain free from injury will improve Outcome: Progressing   Problem: Skin Integrity: Goal: Risk for impaired skin integrity will decrease Outcome: Progressing

## 2020-01-01 NOTE — Progress Notes (Signed)
Gregory Moody TKW:[id number] DOB: 05-02-1976 DOA: 12/27/2019 .  PCP: [place]  Patient coming from: Home  I have personally briefly reviewed patient's old medical records in [place]  Chief Complaint: Weakness  HPI: Gregory Moody is a 44 y.o. male with medical history significant for diabetes mellitus with complications of gastroparesis and chronic kidney disease who was brought into the emergency room for evaluation of generalized weakness. Per EMS, police were called to patient's home to serve a warrant on another resident. When they noted the condition the patient was living in, APS was contacted and they recommended the patient be brought to the ER for further evaluation. Patient states that he has not had anything to eat for at least 5 days and complained of nausea and vomiting but denied having any abdominal pain or changes in his bowel habits. Per EMS, he told them that he was not eating because he wanted to die but does not have any active suicide ideations at this time. Patient lives at home with his mother who he states is his caregiver. He complains of being very weak to ambulate for about 5 days. He denies having any chest pain, shortness of breath, orthopnea or paroxysmal nocturnal dyspnea. He denies being any fever, chills, headaches or any mental status changes  ED Course: Patient was referred to the emergency room by APS because of poor living conditions. Labs revealed significant hyperglycemia with an anion gap metabolic acidosis. He was also hypotensive but asymptomatic. He was started on an insulin drip and received IV fluid hydration. He will be admitted to the hospital for further evaluation .  12/28/19 Pt seen for f/u. His insulin ggt stopped at midnight and he is alert awake and denies any complaints.  12/29/19 Seen today for follow-up he is alert awake feeling better moving he has done quite with physical therapy his walked to the restroom  and back, his weakness has resolved.  But patient still complains of difficulty with swallowing. Chart review has showed that patient had a positive TB QuantiFERON in 2017, she was followed by therapy for 7 months per patient history.  Patient had a CT scan in May of this year which was concerning for pulmonary nodule with recommendations to repeat CT in a few months during hospitalization when he was evaluated by infectious disease And pulmonary and was discontinued off of any isolation precautions.  Discussed with patient that I am going to need to obtain imaging of his chest in addition to possibly a barium swallow for his odynophagia.  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  Pt today is alert and oriented and doing well. He is having diarrhea and is on po vancomycin for his c.diff. stool guaiac is pending because c./diff diarrhea will probably make fecal occult card heme positive.  D/W Pt that he can probably go home in 4-5 days. Pt verbalized understanding.  Labs today shows potassium is 3.2 magnesium is low at 1.5 ,hemoglobin is 7.9 we will transfuse.   12/31/19 Pt seen today he is feeling better , pt was given emperic diflucan 200 mg x 1 however after barium study and d/w radiologist about result fo extensive esophageal candidiasis he is given additional 200 mg to equate to total of 400 mg iv loading, following 10 additional days of 200 mg daily po. With first dose pt states he is able to swallow.and was requesting a regular consistency diet. Labs  today show falsely low sodium due to his high glucose of 237. Magnesium of 1.6 which we ave replaced.h/h is stable at 10.1.  01/01/20 Pt is seen today for f/u. He is doing much better . Diarrhea frequency has decreased. Pt is cachectic for various reasons and suspect he is weak from odynophagia. He is immunocompromised health and hence has severe esophageal candidiasis. Pt needs  ID to be following him and also need specialist care with dermatology.    Review of Systems: As per HPI otherwise 10 point review of systems negative.      Past Medical History:  Diagnosis Date  . COVID-19   . Diabetes mellitus without complication ([place])   . Gastroparesis   . Tuberculosis         Past Surgical History:  Procedure Laterality Date  . ESOPHAGOGASTRODUODENOSCOPY N/A 02/03/2019   Procedure: ESOPHAGOGASTRODUODENOSCOPY (EGD); Surgeon: Toledo, Benay Pike, MD; Location: [facility] ENDOSCOPY; Service: Gastroenterology; Laterality: N/A;   reports that he has been smoking. He has never used smokeless tobacco. He reports that he does not drink alcohol or use drugs.  No Known Allergies       Family History  Problem Relation Age of Onset  . Diabetes Mother   . Diabetes Father           Prior to Admission medications   Medication Sig Start Date End Date Taking? Authorizing Provider  insulin NPH-regular Human (70-30) 100 UNIT/ML injection Inject 20 Units into the skin 2 (two) times daily with a meal. 25 units with breakfast in am and 35 units with supper 04/03/19  Yes [name], MD  aspirin EC 81 MG tablet Take 81 mg by mouth daily.    [provider]  loperamide (IMODIUM A-D) 2 MG tablet Take 1 tablet (2 mg total) by mouth 4 (four) times daily as needed for diarrhea or loose stools.  Patient not taking: Reported on 12/27/2019 04/19/19   [name], MD  naproxen (NAPROSYN) 500 MG tablet Take 1 tablet (500 mg total) by mouth 2 (two) times daily with a meal.  Patient not taking: Reported on 12/27/2019 10/10/19   [name], MD  ondansetron (ZOFRAN) 4 MG tablet Take 1 tablet (4 mg total) by mouth every 6 (six) hours as needed for nausea.  Patient not taking: Reported on 12/27/2019 04/03/19   [name], MD  pantoprazole (PROTONIX) 40 MG tablet Take 1 tablet (40 mg total) by mouth 2 (two) times daily before a meal.  Patient not taking: Reported on  12/27/2019 04/03/19   [name], MD  Physical Exam:       Vitals:   12/27/19 1026 12/27/19 1028 12/27/19 1048  BP:  (!) 89/64   Pulse:  82   Resp:  18   Temp:   (!) 94.2 F (34.6 C)  TempSrc:   Rectal  SpO2:  95%   Weight: 45.4 kg    Height: _0  (1.676 m)          Vitals:   12/27/19 1026 12/27/19 1028 12/27/19 1048  BP:  (!) 89/64   Pulse:  82   Resp:  18   Temp:   (!) 94.2 F (34.6 C)  TempSrc:   Rectal  SpO2:  95%   Weight: 45.4 kg    Height: _1  (1.676 m)     Constitutional: NAD, alert and oriented x 3. Frail,Thin. Eyes: EOMI Respiratory: CLTABL cardiovascular: Regular rate and rhythm, no murmurs / rubs / gallops. No extremity edema. 2+  pedal pulses. No carotid bruits.  Abdomen: no tenderness, no masses palpated. No hepatosplenomegaly. Bowel sounds positive.  Musculoskeletal: no clubbing / cyanosis.  Skin: skin tears to both elbows and wrists of upper extremities, ulcers.  Neurologic: No gross focal neurologic deficit. Generalized weakness  Psychiatric: appropriate   Lab Results:  Results for orders placed or performed during the hospital encounter of 12/27/19 (from the past 72 hour(s))  Glucose, capillary     Status: Abnormal   Collection Time: 12/29/19  4:30 PM  Result Value Ref Range   Glucose-Capillary 109 (H) 70 - 99 mg/dL    Comment: Glucose reference range applies only to samples taken after fasting for at least 8 hours.  Glucose, capillary     Status: Abnormal   Collection Time: 12/29/19 11:24 PM  Result Value Ref Range   Glucose-Capillary 58 (L) 70 - 99 mg/dL    Comment: Glucose reference range applies only to samples taken after fasting for at least 8 hours.  Glucose, capillary     Status: Abnormal   Collection Time: 12/29/19 11:47 PM  Result Value Ref Range   Glucose-Capillary 205 (H) 70 - 99 mg/dL    Comment: Glucose reference range applies only to samples taken after fasting for at least 8 hours.  Glucose, capillary     Status:  Abnormal   Collection Time: 12/30/19  2:08 AM  Result Value Ref Range   Glucose-Capillary 175 (H) 70 - 99 mg/dL    Comment: Glucose reference range applies only to samples taken after fasting for at least 8 hours.  Magnesium     Status: Abnormal   Collection Time: 12/30/19  4:31 AM  Result Value Ref Range   Magnesium 1.5 (L) 1.7 - 2.4 mg/dL    Comment: Performed at [facility], [address] [place], [place] [address]  Phosphorus     Status: Abnormal   Collection Time: 12/30/19  4:31 AM  Result Value Ref Range   Phosphorus 2.1 (L) 2.5 - 4.6 mg/dL    Comment: Performed at [facility], [address] [place], [place] [address]  CBC with Differential/Platelet     Status: Abnormal   Collection Time: 12/30/19  4:31 AM  Result Value Ref Range   WBC 10.0 4.0 - 10.5 K/uL   RBC 2.48 (L) 4.22 - 5.81 MIL/uL   Hemoglobin 7.9 (L) 13.0 - 17.0 g/dL   HCT 22.8 (L) 39.0 - 52.0 %   MCV 91.9 80.0 - 100.0 fL   MCH 31.9 26.0 - 34.0 pg   MCHC 34.6 30.0 - 36.0 g/dL   RDW 12.6 11.5 - 15.5 %   Platelets 156 150 - 400 K/uL   nRBC 0.0 0.0 - 0.2 %   Neutrophils Relative % 82 %   Neutro Abs 8.2 (H) 1.7 - 7.7 K/uL   Lymphocytes Relative 14 %   Lymphs Abs 1.4 0.7 - 4.0 K/uL   Monocytes Relative 3 %   Monocytes Absolute 0.3 0.1 - 1.0 K/uL   Eosinophils Relative 0 %   Eosinophils Absolute 0.0 0.0 - 0.5 K/uL   Basophils Relative 0 %   Basophils Absolute 0.0 0.0 - 0.1 K/uL   Immature Granulocytes 1 %   Abs Immature Granulocytes 0.05 0.00 - 0.07 K/uL    Comment: Performed at [facility], [address], [place], [place] [address]  Comprehensive metabolic panel     Status: Abnormal   Collection Time: 12/30/19  4:31 AM  Result Value Ref Range  Sodium 135 135 - 145 mmol/L   Potassium 3.2 (L) 3.5 - 5.1 mmol/L   Chloride 102 98 - 111 mmol/L   CO2 23 22 - 32 mmol/L   Glucose, Bld 159 (H) 70 - 99 mg/dL    Comment: Glucose reference range applies only to samples taken after  fasting for at least 8 hours.   BUN 17 6 - 20 mg/dL   Creatinine, Ser 1.00 0.61 - 1.24 mg/dL   Calcium 7.7 (L) 8.9 - 10.3 mg/dL   Total Protein 4.9 (L) 6.5 - 8.1 g/dL   Albumin 2.0 (L) 3.5 - 5.0 g/dL   AST 11 (L) 15 - 41 U/L   ALT 5 0 - 44 U/L   Alkaline Phosphatase 59 38 - 126 U/L   Total Bilirubin 0.7 0.3 - 1.2 mg/dL   GFR calc non Af Amer >60 >60 mL/min   GFR calc Af Amer >60 >60 mL/min   Anion gap 10 5 - 15    Comment: Performed at [facility], [address] [place], [place] [address]  C-reactive protein     Status: Abnormal   Collection Time: 12/30/19  4:31 AM  Result Value Ref Range   CRP 9.4 (H) <1.0 mg/dL    Comment: Performed at [facility] [address], [place], [place] [address]  Glucose, capillary     Status: Abnormal   Collection Time: 12/30/19  5:41 AM  Result Value Ref Range   Glucose-Capillary 139 (H) 70 - 99 mg/dL    Comment: Glucose reference range applies only to samples taken after fasting for at least 8 hours.  Glucose, capillary     Status: Abnormal   Collection Time: 12/30/19  8:28 AM  Result Value Ref Range   Glucose-Capillary 161 (H) 70 - 99 mg/dL    Comment: Glucose reference range applies only to samples taken after fasting for at least 8 hours.  Glucose, capillary     Status: Abnormal   Collection Time: 12/30/19 11:27 AM  Result Value Ref Range   Glucose-Capillary 131 (H) 70 - 99 mg/dL    Comment: Glucose reference range applies only to samples taken after fasting for at least 8 hours.  Prepare RBC (crossmatch)     Status: None   Collection Time: 12/30/19  2:55 PM  Result Value Ref Range   Order Confirmation      ORDER PROCESSED BY BLOOD BANK Performed at [facility], [address] [place], [place] [address]   Glucose, capillary     Status: Abnormal   Collection Time: 12/30/19  4:38 PM  Result Value Ref Range   Glucose-Capillary 165 (H) 70 - 99 mg/dL    Comment: Glucose reference range applies only to samples  taken after fasting for at least 8 hours.  Prepare RBC (crossmatch)     Status: None   Collection Time: 12/30/19  8:00 PM  Result Value Ref Range   Order Confirmation      DUPLICATE REQUEST Performed at [facility], [address] [place], [place] [address]   Glucose, capillary     Status: Abnormal   Collection Time: 12/30/19  9:14 PM  Result Value Ref Range   Glucose-Capillary 162 (H) 70 - 99 mg/dL    Comment: Glucose reference range applies only to samples taken after fasting for at least 8 hours.   Comment 1 Notify RN   Magnesium     Status: Abnormal   Collection Time: 12/31/19  3:14 AM  Result Value Ref Range   Magnesium 1.6 (L) 1.7 - 2.4 mg/dL    Comment: Performed at [facility], [address] [place], [place] [address]  Phosphorus     Status: None   Collection Time: 12/31/19  3:14 AM  Result Value Ref Range   Phosphorus 2.9 2.5 - 4.6 mg/dL    Comment: Performed at [facility], [address] [place], [place] [address]  CBC with Differential/Platelet     Status: Abnormal   Collection Time: 12/31/19  3:14 AM  Result Value Ref Range   WBC 9.0 4.0 - 10.5 K/uL   RBC 3.39 (L) 4.22 - 5.81 MIL/uL   Hemoglobin 10.1 (L) 13.0 - 17.0 g/dL    Comment: REPEATED TO VERIFY   HCT 30.3 (L) 39.0 - 52.0 %   MCV 89.4 80.0 - 100.0 fL   MCH 29.8 26.0 - 34.0 pg   MCHC 33.3 30.0 - 36.0 g/dL   RDW 13.2 11.5 - 15.5 %   Platelets 178 150 - 400 K/uL   nRBC 0.0 0.0 - 0.2 %   Neutrophils Relative % 79 %   Neutro Abs 7.1 1.7 - 7.7 K/uL   Lymphocytes Relative 15 %   Lymphs Abs 1.4 0.7 - 4.0 K/uL   Monocytes Relative 5 %   Monocytes Absolute 0.4 0.1 - 1.0 K/uL   Eosinophils Relative 1 %   Eosinophils Absolute 0.1 0.0 - 0.5 K/uL   Basophils Relative 0 %   Basophils Absolute 0.0 0.0 - 0.1 K/uL   Immature Granulocytes 0 %   Abs Immature Granulocytes 0.02 0.00 - 0.07 K/uL    Comment: Performed at [facility], [address] [place], [place]  [address]  Comprehensive metabolic panel     Status: Abnormal   Collection Time: 12/31/19  3:14 AM  Result Value Ref Range   Sodium 131 (L) 135 - 145 mmol/L   Potassium 3.7 3.5 - 5.1 mmol/L   Chloride 101 98 - 111 mmol/L   CO2 23 22 - 32 mmol/L   Glucose, Bld 237 (H) 70 - 99 mg/dL    Comment: Glucose reference range applies only to samples taken after fasting for at least 8 hours.   BUN 13 6 - 20 mg/dL   Creatinine, Ser 0.97 0.61 - 1.24 mg/dL   Calcium 7.6 (L) 8.9 - 10.3 mg/dL   Total Protein 5.5 (L) 6.5 - 8.1 g/dL   Albumin 2.3 (L) 3.5 - 5.0 g/dL   AST 11 (L) 15 - 41 U/L   ALT 7 0 - 44 U/L   Alkaline Phosphatase 66 38 - 126 U/L   Total Bilirubin 2.4 (H) 0.3 - 1.2 mg/dL   GFR calc non Af Amer >60 >60 mL/min   GFR calc Af Amer >60 >60 mL/min   Anion gap 7 5 - 15    Comment: Performed at [facility], [address] [place], [place] [address]  C-reactive protein     Status: Abnormal   Collection Time: 12/31/19  3:14 AM  Result Value Ref Range   CRP 10.2 (H) <1.0 mg/dL    Comment: Performed at [facility] [address] [place], [place] [address]  Glucose, capillary     Status: Abnormal   Collection Time: 12/31/19  8:07 AM  Result Value Ref Range   Glucose-Capillary 201 (H) 70 - 99 mg/dL    Comment: Glucose reference range applies only to samples taken after fasting for at least 8  hours.   Comment 1 Notify RN   Glucose, capillary     Status: Abnormal   Collection Time: 12/31/19 11:43 AM  Result Value Ref Range   Glucose-Capillary 219 (H) 70 - 99 mg/dL    Comment: Glucose reference range applies only to samples taken after fasting for at least 8 hours.   Comment 1 Notify RN   Glucose, capillary     Status: Abnormal   Collection Time: 12/31/19  4:32 PM  Result Value Ref Range   Glucose-Capillary 126 (H) 70 - 99 mg/dL    Comment: Glucose reference range applies only to samples taken after fasting for at least 8 hours.   Comment 1 Notify RN   Glucose, capillary      Status: None   Collection Time: 12/31/19  9:31 PM  Result Value Ref Range   Glucose-Capillary 88 70 - 99 mg/dL    Comment: Glucose reference range applies only to samples taken after fasting for at least 8 hours.  Magnesium     Status: None   Collection Time: 01/01/20  4:18 AM  Result Value Ref Range   Magnesium 1.8 1.7 - 2.4 mg/dL    Comment: Performed at [facility], [address] [place], [place] [address]  Phosphorus     Status: Abnormal   Collection Time: 01/01/20  4:18 AM  Result Value Ref Range   Phosphorus 2.2 (L) 2.5 - 4.6 mg/dL    Comment: Performed at [facility], [address] [place], [place] [address]  CBC with Differential/Platelet     Status: Abnormal   Collection Time: 01/01/20  4:18 AM  Result Value Ref Range   WBC 5.6 4.0 - 10.5 K/uL   RBC 3.29 (L) 4.22 - 5.81 MIL/uL   Hemoglobin 10.0 (L) 13.0 - 17.0 g/dL   HCT 29.0 (L) 39.0 - 52.0 %   MCV 88.1 80.0 - 100.0 fL   MCH 30.4 26.0 - 34.0 pg   MCHC 34.5 30.0 - 36.0 g/dL   RDW 13.7 11.5 - 15.5 %   Platelets 164 150 - 400 K/uL   nRBC 0.0 0.0 - 0.2 %   Neutrophils Relative % 66 %   Neutro Abs 3.7 1.7 - 7.7 K/uL   Lymphocytes Relative 26 %   Lymphs Abs 1.5 0.7 - 4.0 K/uL   Monocytes Relative 7 %   Monocytes Absolute 0.4 0.1 - 1.0 K/uL   Eosinophils Relative 1 %   Eosinophils Absolute 0.1 0.0 - 0.5 K/uL   Basophils Relative 0 %   Basophils Absolute 0.0 0.0 - 0.1 K/uL   Immature Granulocytes 0 %   Abs Immature Granulocytes 0.01 0.00 - 0.07 K/uL    Comment: Performed at [facility], [address] [place], [place] [address]  Comprehensive metabolic panel     Status: Abnormal   Collection Time: 01/01/20  4:18 AM  Result Value Ref Range   Sodium 131 (L) 135 - 145 mmol/L   Potassium 3.4 (L) 3.5 - 5.1 mmol/L   Chloride 101 98 - 111 mmol/L   CO2 23 22 - 32 mmol/L   Glucose, Bld 122 (H) 70 - 99 mg/dL    Comment: Glucose reference range applies only to samples taken after  fasting for at least 8 hours.   BUN 10 6 - 20 mg/dL   Creatinine, Ser 0.84 0.61 - 1.24 mg/dL   Calcium 7.4 (L) 8.9 - 10.3 mg/dL   Total Protein 5.1 (L) 6.5 - 8.1 g/dL  Albumin 2.0 (L) 3.5 - 5.0 g/dL   AST 9 (L) 15 - 41 U/L   ALT 6 0 - 44 U/L   Alkaline Phosphatase 59 38 - 126 U/L   Total Bilirubin 0.9 0.3 - 1.2 mg/dL   GFR calc non Af Amer >60 >60 mL/min   GFR calc Af Amer >60 >60 mL/min   Anion gap 7 5 - 15    Comment: Performed at [facility], [address] [place], [place] [address]  C-reactive protein     Status: Abnormal   Collection Time: 01/01/20  4:18 AM  Result Value Ref Range   CRP 7.4 (H) <1.0 mg/dL    Comment: Performed at [facility] [address], [place], [place] [address]  Glucose, capillary     Status: Abnormal   Collection Time: 01/01/20  7:09 AM  Result Value Ref Range   Glucose-Capillary 100 (H) 70 - 99 mg/dL    Comment: Glucose reference range applies only to samples taken after fasting for at least 8 hours.  Glucose, capillary     Status: None   Collection Time: 01/01/20  7:37 AM  Result Value Ref Range   Glucose-Capillary 84 70 - 99 mg/dL    Comment: Glucose reference range applies only to samples taken after fasting for at least 8 hours.   Comment 1 Notify RN    Comment 2 Document in Chart   Glucose, capillary     Status: Abnormal   Collection Time: 01/01/20 11:44 AM  Result Value Ref Range   Glucose-Capillary 137 (H) 70 - 99 mg/dL    Comment: Glucose reference range applies only to samples taken after fasting for at least 8 hours.   Comment 1 Notify RN    Comment 2 Document in Chart     Radiological Exams on Admission:  Imaging Results (Last 48 hours)  DG Chest Portable 1 View  Result Date: 12/27/2019  CLINICAL DATA: Weakness EXAM: PORTABLE CHEST 1 VIEW COMPARISON: Chest radiographs, 11/18/2019, CT chest, 02/13/2019 FINDINGS: The heart size and mediastinal contours are within normal limits. Unchanged nodule of the peripheral  right upper lobe. The visualized skeletal structures are unremarkable. IMPRESSION: No acute abnormality of the lungs in AP portable projection. Unchanged nodule of the peripheral right upper lobe, better evaluated by prior CT. Electronically Signed By: Eddie Candle M.D. On: 12/27/2019 10:47    EKG: Independently reviewed. Sinus rhythm  Premature ventricular complexes   Assessment/Plan:  Odynophagia- Barium swallow showed esophageal candidiasis.  Esophageal candidiasis- Pt given total of iv fluconazole 400 mg x 1 Followed by po fluconazole 200 mg daily for 10 days.   History of TB/right upper lobe nodule- Repeat CT shows multilobar pneumonia bilaterally, with concern for atypical bacterial including mycobacterial TB active infection.  Due to elevation of D-dimer will also consider CTA of the chest tomorrow to rule out any VTE's now that patient's AKI has resolved. Pt to get 5 days of azithromycin.   Diabetes mellitus type II-\DKA resolved Continue with sliding scale insulin, Accu-Cheks, hypoglycemia protocol. Lantus started 5 units at bedtime.  We will titrate dose and increase To twice daily dosing as  patient's hemoglobin A1c is 15. Home medications with long-acting insulin held.  Acute kidney injury- Resolved  Depression- Patient verbalized not wanting to live and had been starving himself  Has no active suicidal ideation at this time  Will consult psychaitry - per initial note since hospitalization pt's behavior  Does not appear to be self destructive  in any means he is eating. I suspect he was not able to eat because of odynophagia and that Is what has contributed to his ongoing FTT.  Protein calorie malnutrition (Severe)- Will consult dietician.  Patient will benefit from nutritional supplements.   Anemia of chronic disease- Stable .  C.Diff Diarrhea- We will start pt on po vancomycin.  Diarrhea frequency has decreased rocephin d/c azithromycin continued for ?  CAP.  Poor living condition Patient was referred to the ER by APS because of poor living conditions. Patient may need alternative living conditions upon discharge.  DVT prophylaxis: Heparin.  Code Status: Full code  Family Communication: Plan of care was discussed with patient at the bedside. He verbalizes understanding and agrees with the plan  Disposition Plan: d/c home after  Consults called: Case management, Psychaitry , neurology, Pulmonary,

## 2020-01-01 NOTE — Progress Notes (Addendum)
PHARMACY - PHYSICIAN COMMUNICATION CRITICAL VALUE ALERT - BLOOD CULTURE IDENTIFICATION (BCID)  Gregory Moody is an 44 y.o. male who presented to [facility] on 12/27/2019 with a chief complaint of weakness  Assessment:  1/4 GPR no BCID  Name of physician (or Provider) Contacted: Rufina Falco  Current antibiotics: fluconazole, azithromycin, vanc po  Changes to prescribed antibiotics recommended:  Recommendations accepted by provider -- no changes at this time.  Results for orders placed or performed during the hospital encounter of 04/13/19  Blood Culture ID Panel (Reflexed) (Collected: 04/13/2019 11:15 PM)  Result Value Ref Range   Enterococcus species NOT DETECTED NOT DETECTED   Listeria monocytogenes NOT DETECTED NOT DETECTED   Staphylococcus species DETECTED (A) NOT DETECTED   Staphylococcus aureus (BCID) NOT DETECTED NOT DETECTED   Methicillin resistance NOT DETECTED NOT DETECTED   Streptococcus species NOT DETECTED NOT DETECTED   Streptococcus agalactiae NOT DETECTED NOT DETECTED   Streptococcus pneumoniae NOT DETECTED NOT DETECTED   Streptococcus pyogenes NOT DETECTED NOT DETECTED   Acinetobacter baumannii NOT DETECTED NOT DETECTED   Enterobacteriaceae species NOT DETECTED NOT DETECTED   Enterobacter cloacae complex NOT DETECTED NOT DETECTED   Escherichia coli NOT DETECTED NOT DETECTED   Klebsiella oxytoca NOT DETECTED NOT DETECTED   Klebsiella pneumoniae NOT DETECTED NOT DETECTED   Proteus species NOT DETECTED NOT DETECTED   Serratia marcescens NOT DETECTED NOT DETECTED   Haemophilus influenzae NOT DETECTED NOT DETECTED   Neisseria meningitidis NOT DETECTED NOT DETECTED   Pseudomonas aeruginosa NOT DETECTED NOT DETECTED   Candida albicans NOT DETECTED NOT DETECTED   Candida glabrata NOT DETECTED NOT DETECTED   Candida krusei NOT DETECTED NOT DETECTED   Candida parapsilosis NOT DETECTED NOT DETECTED   Candida tropicalis NOT DETECTED NOT DETECTED   Tobie Lords, PharmD, BCPS Clinical Pharmacist 01/01/2020  2:25 AM

## 2020-01-02 DIAGNOSIS — K219 Gastro-esophageal reflux disease without esophagitis: Secondary | ICD-10-CM

## 2020-01-02 DIAGNOSIS — L89029 Pressure ulcer of left elbow, unspecified stage: Secondary | ICD-10-CM

## 2020-01-02 DIAGNOSIS — E43 Unspecified severe protein-calorie malnutrition: Secondary | ICD-10-CM | POA: Insufficient documentation

## 2020-01-02 LAB — GLUCOSE, CAPILLARY
Glucose-Capillary: 96 mg/dL (ref 70–99)
Glucose-Capillary: 115 mg/dL — ABNORMAL HIGH (ref 70–99)
Glucose-Capillary: 234 mg/dL — ABNORMAL HIGH (ref 70–99)
Glucose-Capillary: 248 mg/dL — ABNORMAL HIGH (ref 70–99)

## 2020-01-02 LAB — C-REACTIVE PROTEIN: CRP: 5.3 mg/dL — ABNORMAL HIGH (ref <1.0)

## 2020-01-02 MED ORDER — PANTOPRAZOLE SODIUM 40 MG PO TBEC
40.0000 mg | DELAYED_RELEASE_TABLET | Freq: Two times a day (BID) | ORAL | Status: DC
  Administered 2020-01-02 – 2020-01-03 (×3): 40 mg via ORAL
  Filled 2020-01-02 (×2): qty 1

## 2020-01-02 MED ORDER — AZITHROMYCIN 500 MG PO TABS
500.0000 mg | ORAL_TABLET | Freq: Every day | ORAL | Status: AC
  Administered 2020-01-02: 500 mg via ORAL
  Filled 2020-01-02: qty 1

## 2020-01-02 NOTE — TOC Progression Note (Signed)
Transition of Care [facility]) - Progression Note    Patient Details  Name: Gregory Moody MRN: [id number] Date of Birth: 12/05/1975  Transition of Care [facility]) CM/SW Contact  Jaidev Sanger, Gardiner Rhyme, LCSW Phone Number: 01/02/2020, 3:05 PM  Clinical Narrative:  Met with pt to discuss plan for discharge tomorrow. Tina_APS to see today but feels he is safe to return home with his parents and 45 yo son.   Will work on obtaining medications for him and have made referral to [facility] for him to follow up with. MD aware and feels medically stable for discharge tomorrow. Work on discharge needs.         Expected Discharge Plan and Services                                                 Social Determinants of Health (SDOH) Interventions    Readmission Risk Interventions Readmission Risk Prevention Plan 04/19/2019 03/07/2019  Transportation Screening Complete Complete  PCP or Specialist Appt within 3-5 Days - Complete  Social Work Consult for [place] Planning/Counseling - (No Data)  Palliative Care Screening - Not Applicable  Medication Review Press photographer) Complete Complete  PCP or Specialist appointment within 3-5 days of discharge Complete -  [place] or Home Care Consult Complete -  SW Recovery Care/Counseling Consult Complete -  Palliative Care Screening Not Applicable -  [place] Not Applicable -  Some recent data might be hidden

## 2020-01-02 NOTE — Consult Note (Signed)
  Patient reevaluated today.  Patient states that he is feeling much better given the medical treatment he has received.  He denies depression is responsible for his lack of eating at this time but states that he was not eating due to the nausea that he was experiencing.  When asked why patient had acknowledged depression prior, patient has little recollection of it and states that he may have been confused.  Patient adamantly denies any signs and symptoms of depression.  He denies any suicidal or homicidal ideation.  He denies any manic or psychotic symptoms.  Denies any past history of psychiatric issues or ever having a stay in a psychiatric inpatient hospital before.  At this time patient no longer meets criteria for inpatient psychiatric hospitalization.  He will be discharged to the community.

## 2020-01-02 NOTE — Progress Notes (Signed)
Patient ID: Gregory Moody, male   DOB: 06-19-1976, 44 y.o.   MRN: [id number]  Discussed with psychiatry Dr Claris Gower-- patient has C. difficile diarrhea he will not be able to go to inpatient psychiatry. Psychiatry will evaluate him and let me know their plans. Will discussed with TOC thereafter discharge plans.

## 2020-01-02 NOTE — Progress Notes (Signed)
[place] [place] NAME: Gregory Moody    MR#:  [id number]  DATE OF BIRTH:  1976-04-01  SUBJECTIVE:   Patient continues with some diarrhea. Overnight had two episodes. He drinks soda is also. I asked him not to drink much soda is given diarrhea. Able to swallow. Eating better. Sugars now improved. REVIEW OF SYSTEMS:   Review of Systems  Constitutional: Positive for malaise/fatigue. Negative for chills, fever and weight loss.  HENT: Negative for ear discharge, ear pain and nosebleeds.   Eyes: Negative for blurred vision, pain and discharge.  Respiratory: Negative for sputum production, shortness of breath, wheezing and stridor.   Cardiovascular: Negative for chest pain, palpitations, orthopnea and PND.  Gastrointestinal: Positive for diarrhea. Negative for abdominal pain, nausea and vomiting.  Genitourinary: Negative for frequency and urgency.  Musculoskeletal: Negative for back pain and joint pain.  Neurological: Positive for weakness. Negative for sensory change, speech change and focal weakness.  Psychiatric/Behavioral: Negative for depression and hallucinations. The patient is not nervous/anxious.    Tolerating Diet:yes Tolerating PT: Home health  DRUG ALLERGIES:  No Known Allergies  VITALS:  Blood pressure 119/85, pulse 76, temperature (!) 97.4 F (36.3 C), temperature source Oral, resp. rate 20, height 5\' 7"  (1.702 m), weight 50.1 kg, SpO2 100 %.  PHYSICAL EXAMINATION:   Physical Exam  GENERAL:  44 y.o.-year-old patient lying in the bed with no acute distress. Thin cachectic poorly nourished EYES: Pupils equal, round, reactive to light and accommodation. No scleral icterus.   HEENT: Head atraumatic, normocephalic. Oropharynx and nasopharynx clear.  NECK:  Supple, no jugular venous distention. No thyroid enlargement, no tenderness.  LUNGS: Normal breath sounds bilaterally, no wheezing, rales, rhonchi. No use of  accessory muscles of respiration.  CARDIOVASCULAR: S1, S2 normal. No murmurs, rubs, or gallops.  ABDOMEN: Soft, nontender, nondistended. Bowel sounds present. No organomegaly or mass.  EXTREMITIES: patient has skin breakdown over his elbows and knuckles NEUROLOGIC: Cranial nerves II through XII are intact. No focal Motor or sensory deficits b/l. Generalized weakness PSYCHIATRIC:  patient is alert and oriented x 3.  SKIN:  Pressure Injury 12/28/19 Elbow Left Stage 2 -  Partial thickness loss of dermis presenting as a shallow open injury with a red, pink wound bed without slough. (Active)  12/28/19 1422  Location: Elbow  Location Orientation: Left  Staging: Stage 2 -  Partial thickness loss of dermis presenting as a shallow open injury with a red, pink wound bed without slough.  Wound Description (Comments):   Present on Admission: Yes (Present on transfer to 1A)     Pressure Injury 12/28/19 Elbow Right Stage 3 -  Full thickness tissue loss. Subcutaneous fat may be visible but bone, tendon or muscle are NOT exposed. (Active)  12/28/19 1422  Location: Elbow  Location Orientation: Right  Staging: Stage 3 -  Full thickness tissue loss. Subcutaneous fat may be visible but bone, tendon or muscle are NOT exposed.  Wound Description (Comments):   Present on Admission: Yes (Present on transfer to 1A)   LABORATORY PANEL:  CBC Recent Labs  Lab 01/01/20 0418  WBC 5.6  HGB 10.0*  HCT 29.0*  PLT 164    Chemistries  Recent Labs  Lab 01/01/20 0418  NA 131*  K 3.4*  CL 101  CO2 23  GLUCOSE 122*  BUN 10  CREATININE 0.84  CALCIUM 7.4*  MG 1.8  AST 9*  ALT 6  ALKPHOS 59  BILITOT 0.9  Cardiac Enzymes No results for input(s): TROPONINI in the last 168 hours. RADIOLOGY:  DG ESOPHAGUS W SINGLE CM (SOL OR THIN BA)  Result Date: 12/31/2019 CLINICAL DATA:  Odynophagia. Trouble swallowing for 3 weeks. EXAM: ESOPHOGRAM / BARIUM SWALLOW / BARIUM TABLET STUDY TECHNIQUE: Combined double  contrast and single contrast examination performed using effervescent crystals, thick barium liquid, and thin barium liquid. The patient was observed with fluoroscopy swallowing a 13 mm barium sulphate tablet. FLUOROSCOPY TIME:  Fluoroscopy Time:  48 seconds Radiation Exposure Index (if provided by the fluoroscopic device): 3.4 mGy Number of Acquired Spot Images: 0 COMPARISON:  None. FINDINGS: Normal pharyngeal anatomy and motility. Contrast flowed freely through the esophagus without evidence of a stricture or mass. Prominent longitudinal a oriented filling defects likely reflecting mucosal plaques with severe irregularity of the mucosa. Shaggy appearance of the esophagus likely reflecting small ulcers. Small pseudodiverticula of the esophagus. Esophageal motility was normal. Moderate severity gastroesophageal reflux. No definite hiatal hernia was demonstrated. IMPRESSION: 1. Severe irregularity of the mucosa with longitudinal plaques, with tiny pseudodiverticula and small ulcerations likely reflecting diffuse esophageal candidiasis. 2. Moderate severity gastroesophageal reflux. Electronically Signed   By: Kathreen Devoid   On: 12/31/2019 09:55   ASSESSMENT AND PLAN:  Gregory Moody is a 44 y.o. male with medical history significant for diabetes mellitus with complications of gastroparesis and chronic kidney disease who was brought into the emergency room for evaluation of generalized weakness. Per EMS, police were called to patient's home to serve a warrant for him.   Odynophagia due to Esophageal candidiasis Barium swallow showed esophageal candidiasis. Pt given total of iv fluconazole 400 mg x 1 Followed by po fluconazole 200 mg daily for 10 days-after stopping azithromycin today  Diabetes mellitus type II-\DKA resolved -Continue with sliding scale insulin, Accu-Cheks, hypoglycemia protocol. -Lantus started 5 units at bedtime.  We will titrate dose and increase according to sugars. -Patient  used to take about 30 units twice a day at home however given his decreased weight and overall poor PO intake requirements so far for insulin is low - hemoglobin A1c is 15.[name]  History of right upper lobe nodule -Repeat CT shows multilobar pneumonia bilaterally, with concern for atypical bacterial including mycobacterial TB active infection.   -patient denies any shortness of breath. Sats are stable on room air. -Pt to get 5 days of azithromycin-- last dose today.  Acute kidney injury in the setting of DKA and poor PO intake Resolved  Depression-chronic -Patient verbalized not wanting to live and had been starving himself  -Has no active suicidal ideation at this time  -per psychiatry consultation done on March 18 recommendations were to transferred to inpatient behavioral medicine when medically clear. -Will discussed with Dr. Claris Gower to see if patient still needs inpatient behavioral medicine card.  Protein calorie malnutrition (Severe) appreciate dietician input continue nutritional supplements.   Anemia of chronic disease Stable .  C.Diff Diarrhea  pt on po vancomycin-- patient has diarrhea however frequency decreased. Will continue vancomycin 10 days from stopping azithromycin which is today. Discussed with pharmacist  Poor living condition/failure to thrive Patient was referred to the ER by APS because of poor living conditions.  TOC consulted   Procedures: none Family communication : Consults : ID, psychiatry, pulmonary, neurology Discharge Disposition : patient is from home. Waiting psychiatry input if patient is able to transfer downstairs else will discussed with [facility] discharge options. Moody STATUS: full DVT Prophylaxis : heparin  TOTAL TIME TAKING CARE OF THIS PATIENT: **  25 minutes.  >50% time spent on counselling and coordination of care  Note: This dictation was prepared with Dragon dictation along with smaller phrase technology. Any transcriptional  errors that result from this process are unintentional.  Fritzi Mandes M.D    [facility]   CC: Primary care physician; [facility]Patient ID: Gregory Moody, male   DOB: [birth year], 44 y.o.   MRN: [id number]

## 2020-01-02 NOTE — TOC Progression Note (Addendum)
Transition of Care [facility]) - Progression Note    Patient Details  Name: Gregory Moody MRN: [id number] Date of Birth: 1976-08-13  Transition of Care [facility]) CM/SW Contact  Mattingly Fountaine, Gardiner Rhyme, LCSW Phone Number: 01/02/2020, 9:31 AM  Clinical Narrative:   Have left message for Tina-APS to inform pt is not able to go to Inpt Psych due to C-diff issues. Awaiting return call and message yesterday was she will be here today to see pt. Will await her recommendations of for pt to discharge home if safe environment for him.  10:00 AM spoke with Tina-APS she will come by this afternoon but feels he has capacity and can go home safely.  10:15 Spoke with pt to make aware going home today but Tina-APS seeing this afternoon. He feels safe at home his parents and 52 yo son reside there. He feels better medically and psychologically and knows he needs to take care of himself so he can be around for his son. Aware working on discharge needs for home today.         Expected Discharge Plan and Services                                                 Social Determinants of Health (SDOH) Interventions    Readmission Risk Interventions Readmission Risk Prevention Plan 04/19/2019 03/07/2019  Transportation Screening Complete Complete  PCP or Specialist Appt within 3-5 Days - Complete  Social Work Consult for [place] Planning/Counseling - (No Data)  Palliative Care Screening - Not Applicable  Medication Review Press photographer) Complete Complete  PCP or Specialist appointment within 3-5 days of discharge Complete -  [place] or Home Care Consult Complete -  SW Recovery Care/Counseling Consult Complete -  Palliative Care Screening Not Applicable -  [place] Not Applicable -  Some recent data might be hidden

## 2020-01-02 NOTE — Progress Notes (Signed)
Physical Therapy Treatment Patient Details Name: Gregory Moody MRN: [id number] DOB: [birth year] Today's Date: 01/02/2020    History of Present Illness  44 y.o. male with medical history significant for diabetes mellitus with complications of gastroparesis and chronic kidney disease who was brought into the emergency room for evaluation of generalized weakness. Per EMS, police were called to patient's home to serve a warrant on another resident. When they noted the condition the patient was living in, APS was contacted and they recommended the patient be brought to the ER for further evaluation. Patient states that he has not had anything to eat for at least 5 days prior to coming in and complained of nausea and vomiting.    PT Comments    Pt agrees to session with encouragement.  Pt is able to get in and out of bed without assist.  Stated he would not use walker at home so gait today without walker.  He is able to complete a lap around small nursing pod without AD but generally unsteady gait.  On initiation of 2nd lap, pt's RN stated HR was up to 180 on telemetry monitor.  Returned to room and HR back to 80's when sitting.    Encouraged pt to use RW with gait at home but he seems resistant to it.   Follow Up Recommendations  Home health PT;Supervision - Intermittent     Equipment Recommendations  Rolling walker with 5" wheels    Recommendations for Other Services       Precautions / Restrictions Precautions Precautions: Fall Restrictions Weight Bearing Restrictions: No    Mobility  Bed Mobility Overal bed mobility: Independent                Transfers Overall transfer level: Modified independent Equipment used: None                Ambulation/Gait Ambulation/Gait assistance: Min assist Gait Distance (Feet): 100 Feet Assistive device: Rolling walker (2 wheeled) Gait Pattern/deviations: Step-through pattern;Decreased step length - right;Decreased step  length - left;Wide base of support     General Gait Details: pt opted not to use RW but generally unsteady without it.  encouraged use at home but he stated he would not use one at home   Stairs             Wheelchair Mobility    Modified Rankin (Stroke Patients Only)       Balance Overall balance assessment: Needs assistance Sitting-balance support: No upper extremity supported Sitting balance-Leahy Scale: Good     Standing balance support: No upper extremity supported Standing balance-Leahy Scale: Poor Standing balance comment: generally unsteadywith gait.  +1 assist for safety/balance.                            Cognition Arousal/Alertness: Awake/alert Behavior During Therapy: WFL for tasks assessed/performed Overall Cognitive Status: Within Functional Limits for tasks assessed                                        Exercises      General Comments        Pertinent Vitals/Pain Pain Assessment: No/denies pain    Home Living                      Prior Function  PT Goals (current goals can now be found in the care plan section) Progress towards PT goals: Progressing toward goals    Frequency    Min 2X/week      PT Plan Current plan remains appropriate    Co-evaluation              AM-PAC PT "6 Clicks" Mobility   Outcome Measure  Help needed turning from your back to your side while in a flat bed without using bedrails?: None Help needed moving from lying on your back to sitting on the side of a flat bed without using bedrails?: None Help needed moving to and from a bed to a chair (including a wheelchair)?: None Help needed standing up from a chair using your arms (e.g., wheelchair or bedside chair)?: None Help needed to walk in hospital room?: A Little Help needed climbing 3-5 steps with a railing? : A Little 6 Click Score: 22    End of Session Equipment Utilized During Treatment: Gait  belt Activity Tolerance: Patient tolerated treatment well;Treatment limited secondary to medical complications (Comment) Patient left: in bed;with call bell/phone within reach;with bed alarm set Nurse Communication: Mobility status;Other (comment)       Time: 9169-4503 PT Time Calculation (min) (ACUTE ONLY): 15 min  Charges:  $Gait Training: 8-22 mins                    Chesley Noon, PTA 01/02/20, 10:29 AM

## 2020-01-02 NOTE — Progress Notes (Signed)
Nutrition Follow-up  DOCUMENTATION CODES:   Severe malnutrition in context of social or environmental circumstances, Underweight  INTERVENTION:  Continue Ensure Max Protein po BID, each supplement provides 150 kcal and 30 grams of protein.  Continue daily MVI.  NUTRITION DIAGNOSIS:   Severe Malnutrition related to social / environmental circumstances(inadequate oral intake, limited access to food) as evidenced by per patient/family report, severe fat depletion, severe muscle depletion.  Ongoing.  GOAL:   Patient will meet greater than or equal to 90% of their needs  Progressing.  MONITOR:   PO intake, Supplement acceptance, Labs, Weight trends, I & O's, Skin  REASON FOR ASSESSMENT:   Malnutrition Screening Tool, Consult Assessment of nutrition requirement/status  ASSESSMENT:   44 year old male with PMHx of TB, DM, gastroparesis, hx COVID-19 infection 04/2019 admitted with DKA, AKI, anemia of chronic disease, depression.  Patient reports his appetite has improved. He reports he is eating almost all of his meals. According to chart he is eating 75-100% of meals. He reports he is drinking the Ensure Max protein and enjoys them.  Medications reviewed and include: azithromycin, Diflucan, Novolog 0-6 units TID, Novolog 2 units TID, Lantus 5 units QHS, MVI daily, pantoprazole, Carafate 1 gram TID and QHS, vancomycin, NS 100 mL/hr.  Labs reviewed: CBG 90-133.  Diet Order:   Diet Order            Diet Carb Modified Fluid consistency: Thin; Room service appropriate? Yes  Diet effective now             EDUCATION NEEDS:   Not appropriate for education at this time  Skin:  Skin Assessment: Skin Integrity Issues:(stg II right elbow; stg III left elbow)  Last BM:  01/01/2020 medium type 6  Height:   Ht Readings from Last 1 Encounters:  12/28/19 5\' 7"  (1.702 m)   Weight:   Wt Readings from Last 1 Encounters:  12/28/19 50.1 kg   Ideal Body Weight:  67.3 kg  BMI:   Body mass index is 17.3 kg/m.  Estimated Nutritional Needs:   Kcal:  1800-2000  Protein:  90-100 grams  Fluid:  1.5-1.8 L/day  Jacklynn Barnacle, MS, RD, LDN Pager number available on Amion

## 2020-01-03 ENCOUNTER — Telehealth: Payer: Self-pay | Admitting: Gerontology

## 2020-01-03 DIAGNOSIS — E43 Unspecified severe protein-calorie malnutrition: Secondary | ICD-10-CM

## 2020-01-03 LAB — CULTURE, BLOOD (ROUTINE X 2): Special Requests: ADEQUATE

## 2020-01-03 LAB — GLUCOSE, CAPILLARY
Glucose-Capillary: 165 mg/dL — ABNORMAL HIGH (ref 70–99)
Glucose-Capillary: 122 mg/dL — ABNORMAL HIGH (ref 70–99)

## 2020-01-03 LAB — PREPARE RBC (CROSSMATCH)

## 2020-01-03 LAB — C-REACTIVE PROTEIN: CRP: 3.2 mg/dL — ABNORMAL HIGH (ref <1.0)

## 2020-01-03 MED ORDER — FLUCONAZOLE 200 MG PO TABS: 200.0000 mg | ORAL_TABLET | Freq: Every day | ORAL | 0 refills | Status: DC

## 2020-01-03 MED ORDER — PANTOPRAZOLE SODIUM 40 MG PO TBEC: 40.0000 mg | DELAYED_RELEASE_TABLET | Freq: Two times a day (BID) | ORAL | 2 refills | Status: DC

## 2020-01-03 MED ORDER — INSULIN ASPART 100 UNIT/ML ~~LOC~~ SOLN: 2.0000 [IU] | Freq: Three times a day (TID) | SUBCUTANEOUS | 11 refills | Status: DC

## 2020-01-03 MED ORDER — INSULIN GLARGINE 100 UNIT/ML ~~LOC~~ SOLN: 10.0000 [IU] | Freq: Every day | SUBCUTANEOUS | 11 refills | Status: DC

## 2020-01-03 MED ORDER — SUCRALFATE 1 G PO TABS: 1.0000 g | ORAL_TABLET | Freq: Three times a day (TID) | ORAL | 1 refills | Status: DC

## 2020-01-03 MED ORDER — VANCOMYCIN HCL 125 MG PO CAPS: 125.0000 mg | ORAL_CAPSULE | Freq: Four times a day (QID) | ORAL | 0 refills | Status: AC

## 2020-01-03 MED ORDER — ADULT MULTIVITAMIN W/MINERALS CH: 1.0000 | ORAL_TABLET | Freq: Every day | ORAL | 0 refills | Status: DC

## 2020-01-03 NOTE — TOC Transition Note (Addendum)
Transition of Care [facility]) - CM/SW Discharge Note   Patient Details  Name: Gregory Moody MRN: [id number] Date of Birth: 12/03/1975  [facility]) CM/SW Contact:  Elease Hashimoto, LCSW Phone Number: 01/03/2020, 12:13 PM   Clinical Narrative:   Pt feels ready to discharge home. Tina-APS has approved for him to return and feels pt has capacity for this. Pt lives with parents and 45 yo son. Someone is always there. Have gotten [facility] via [facility] and bedside RN-Matt will give to pt once goes over discharge paperwork. Pt declined a rw reports will not use at discharge. Gave pt resources for community and pt to follow up with. Pt feels much better physically and emotionally. No further follow due to discharge home today. [facility].    Final next level of care: [place] Barriers to Discharge: Barriers Resolved   Patient Goals and CMS Choice        Discharge Placement                Patient to be transferred to facility by: family member Name of family member notified: Mom Patient and family notified of of transfer: 01/03/20  Discharge Plan and Services                          HH Arranged: PT [place] Date [place]: 01/03/20 Time HH Agency Contacted: 11 Representative spoke with at [place]: cassie  Social Determinants of Health ([place]) Interventions     Readmission Risk Interventions Readmission Risk Prevention Plan 04/19/2019 03/07/2019  Transportation Screening Complete Complete  PCP or Specialist Appt within 3-5 Days - Complete  Social Work Consult for [place] Planning/Counseling - (No Data)  Palliative Care Screening - Not Applicable  Medication Review Press photographer) Complete Complete  PCP or Specialist appointment within 3-5 days of discharge Complete -  [place] or Home Care Consult Complete -  SW Recovery Care/Counseling  Consult Complete -  Palliative Care Screening Not Applicable -  [place] Not Applicable -  Some recent data might be hidden

## 2020-01-03 NOTE — Discharge Summary (Signed)
Gregory Moody at [facility] NAME: Gregory Moody    MR#:  [id number]  DATE OF BIRTH:  [birth year]  DATE OF ADMISSION:  12/27/2019 ADMITTING PHYSICIAN: Collier Bullock, MD  DATE OF DISCHARGE:   PRIMARY CARE PHYSICIAN: [place]    ADMISSION DIAGNOSIS:  AKI (acute kidney injury) ([place]) [N17.9] DKA, type 2 ([place]) [E11.10] Diabetic ketoacidosis without coma associated with type 2 diabetes mellitus ([place]) [E11.10] Malnutrition, unspecified type ([place]) [E46] Pressure injury of skin of left elbow, unspecified injury stage [L89.029]  DISCHARGE DIAGNOSIS:  DKA in type II--resolved Uncontrolled Dm-2 secondary to noncompliance Acute renal failure--resolved Odynophagia due to Esophageal candidiasis Severe Protein Calorie Malnutrition/Adult FTT Chronic elbow Stage 2 ulcers SECONDARY DIAGNOSIS:   Past Medical History:  Diagnosis Date  . COVID-19   . Diabetes mellitus without complication ([place])   . Gastroparesis   . Tuberculosis     HOSPITAL COURSE:   Gregory Santiago-Gonzalezis a 44 y.o.malewith medical history significant for diabetes mellitus with complications of gastroparesis and chronic kidney disease who was brought into the emergency room for evaluation of generalized weakness. Per EMS, police were called to patient's home to serve a warrant for him.  Odynophagia due to Esophageal candidiasis -Barium swallowshowed esophageal candidiasis. -Pt given total of iv fluconazole 400 mg x 1 -Followed by po fluconazole 200 mg daily for 9 days -pt able to swallow better  Diabetes mellitus type II, uncontrolled due to non compliance DKA resolved -Continue with sliding scale insulin, Accu-Cheks, hypoglycemia protocol. -Lantus now at  10 units at bedtime and Aspart 2 units tid -sugars 90's-140's with few sugars in 200's - We will titrate dose and increase according to sugars. -Patient used to take about 30 units twice a day  at home however given his decreased weight and overall poor PO intake requirements so far for insulin is low - hemoglobin A1c is 15. -pt will need [facility]  History of right upper lobe nodule -Repeat CT shows multilobar pneumonia bilaterally, with concern for atypical bacterial including mycobacterial TB active infection.  -patient denies any shortness of breath. Sats are stable on room air. -Pt to get 5 days of azithromycin---completed  Acute kidney injury in the setting of DKA and poor PO intake Resolved  Depression-chronic -Patient verbalized not wanting to live and 44 had been starving himself  -Has no active suicidal ideation at this time  -per psychiatry consultation done on March 18 recommendations were to transferred to inpatient behavioral medicine when medically clear. -discussed with Dr. Linus Orn is ok from psych standpoint to go home--no more suicidal.  Protein calorie malnutrition (Severe) appreciate dietician input continue nutritional supplements.   Anemia of chronic disease Stable .  C.Diff Diarrhea  pt on po vancomycin-- patient has diarrhea however frequency decreased. Will continue vancomycin 10 days.Discussed with pharmacist  Poor living condition/failure to thrive Patient was referred to the ER by APS because of poor living conditions--APS worker ok with pt returning home with his parents.   TOC--to help with po vanc, Insulin and diflucan thru med management clinic. Pt overall at baseline D/c home   Procedures: none Consults : ID, psychiatry, pulmonary, neurology Discharge Disposition : patient is from home. D/[facility] with [facility] today  CODE STATUS: full DVT Prophylaxis : heparin CONSULTS OBTAINED:  Treatment Team:  Ottie Glazier, MD Dixie Dials, MD  DRUG ALLERGIES:  No Known Allergies  DISCHARGE MEDICATIONS:   Allergies as of 01/03/2020   No Known  Allergies     Medication List    STOP  taking these medications   insulin NPH-regular Human (70-30) 100 UNIT/ML injection   loperamide 2 MG tablet Commonly known as: Imodium A-D   naproxen 500 MG tablet Commonly known as: Naprosyn   ondansetron 4 MG tablet Commonly known as: ZOFRAN     TAKE these medications   aspirin EC 81 MG tablet Take 81 mg by mouth daily.   fluconazole 200 MG tablet Commonly known as: DIFLUCAN Take 1 tablet (200 mg total) by mouth daily.   insulin aspart 100 UNIT/ML injection Commonly known as: novoLOG Inject 2 Units into the skin 3 (three) times daily with meals.   insulin glargine 100 UNIT/ML injection Commonly known as: LANTUS Inject 0.1 mLs (10 Units total) into the skin at bedtime.   multivitamin with minerals Tabs tablet Take 1 tablet by mouth daily.   pantoprazole 40 MG tablet Commonly known as: PROTONIX Take 1 tablet (40 mg total) by mouth 2 (two) times daily before a meal.   sucralfate 1 g tablet Commonly known as: CARAFATE Take 1 tablet (1 g total) by mouth 4 (four) times daily -  with meals and at bedtime.   vancomycin 125 MG capsule Commonly known as: Vancocin HCl Take 1 capsule (125 mg total) by mouth 4 (four) times daily for 7 days.       If you experience worsening of your admission symptoms, develop shortness of breath, life threatening emergency, suicidal or homicidal thoughts you must seek medical attention immediately by calling 911 or calling your MD immediately  if symptoms less severe.  You Must read complete instructions/literature along with all the possible adverse reactions/side effects for all the Medicines you take and that have been prescribed to you. Take any new Medicines after you have completely understood and accept all the possible adverse reactions/side effects.   Please note  You were cared for by a hospitalist during your hospital stay. If you have any questions about your discharge medications or the care you received while you were in the  hospital after you are discharged, you can call the unit and asked to speak with the hospitalist on call if the hospitalist that took care of you is not available. Once you are discharged, your primary care physician will handle any further medical issues. Please note that NO REFILLS for any discharge medications will be authorized once you are discharged, as it is imperative that you return to your primary care physician (or establish a relationship with a primary care physician if you do not have one) for your aftercare needs so that they can reassess your need for medications and monitor your lab values. Today   SUBJECTIVE   No new complaints  VITAL SIGNS:  Blood pressure 110/84, pulse 73, temperature 98 F (36.7 C), temperature source Oral, resp. rate 18, height 5\' 7"  (1.702 m), weight 50.1 kg, SpO2 100 %.  I/O:  No intake or output data in the 24 hours ending 01/03/20 0736  PHYSICAL EXAMINATION:   GENERAL:  44 y.o.-year-old patient lying in the bed with no acute distress. Thin cachectic poorly nourished EYES: Pupils equal, round, reactive to light and accommodation. No scleral icterus.   HEENT: Head atraumatic, normocephalic. Oropharynx and nasopharynx clear.  NECK:  Supple, no jugular venous distention. No thyroid enlargement, no tenderness.  LUNGS: Normal breath sounds bilaterally, no wheezing, rales, rhonchi. No use of accessory muscles of respiration.  CARDIOVASCULAR: S1, S2 normal. No murmurs, rubs, or gallops.  ABDOMEN: Soft, nontender, nondistended. Bowel sounds present. No organomegaly or mass.  EXTREMITIES: patient has skin breakdown over his elbows and knuckles NEUROLOGIC: Cranial nerves II through XII are intact. No focal Motor or sensory deficits b/l. Generalized weakness PSYCHIATRIC:  patient is alert and oriented x 3.  SKIN:  Pressure Injury 12/28/19 Elbow Left Stage 2 -  Partial thickness loss of dermis presenting as a shallow open injury with a red, pink wound bed  without slough. (Active)  12/28/19 1422  Location: Elbow  Location Orientation: Left  Staging: Stage 2 -  Partial thickness loss of dermis presenting as a shallow open injury with a red, pink wound bed without slough.  Wound Description (Comments):   Present on Admission: Yes (Present on transfer to 1A)     Pressure Injury 12/28/19 Elbow Right Stage 3 -  Full thickness tissue loss. Subcutaneous fat may be visible but bone, tendon or muscle are NOT exposed. (Active)  12/28/19 1422  Location: Elbow  Location Orientation: Right  Staging: Stage 3 -  Full thickness tissue loss. Subcutaneous fat may be visible but bone, tendon or muscle are NOT exposed.  Wound Description (Comments):   Present on Admission: Yes (Present on transfer to 1A)    DATA REVIEW:   CBC  Recent Labs  Lab 01/01/20 0418  WBC 5.6  HGB 10.0*  HCT 29.0*  PLT 164    Chemistries  Recent Labs  Lab 01/01/20 0418  NA 131*  K 3.4*  CL 101  CO2 23  GLUCOSE 122*  BUN 10  CREATININE 0.84  CALCIUM 7.4*  MG 1.8  AST 9*  ALT 6  ALKPHOS 59  BILITOT 0.9    Microbiology Results   Recent Results (from the past 240 hour(s))  Culture, blood (routine x 2)     Status: None (Preliminary result)   Collection Time: 12/27/19 11:01 AM   Specimen: BLOOD  Result Value Ref Range Status   Specimen Description   Final    BLOOD LEFT AC Performed at [facility], [address], [place], [place] [address]    Special Requests   Final    BOTTLES DRAWN AEROBIC AND ANAEROBIC Blood Culture adequate volume Performed at [facility], [address] [place], [place] [address]    Culture  Setup Time   Final    GRAM POSITIVE RODS ANAEROBIC BOTTLE ONLY CRITICAL RESULT CALLED TO, READ BACK BY AND VERIFIED WITH: Tobie Lords PHARMD 0025 01/01/20 HNM Performed at [facility], [address], [place], [place] [address]    Culture   Final    CULTURE REINCUBATED FOR BETTER GROWTH Performed at [facility], [place] [address], [place], [place] [address]    Report Status PENDING  Incomplete  Culture, blood (routine x 2)     Status: None   Collection Time: 12/27/19 11:02 AM   Specimen: BLOOD  Result Value Ref Range Status   Specimen Description BLOOD LEFT WRIST  Final   Special Requests   Final    BOTTLES DRAWN AEROBIC AND ANAEROBIC Blood Culture adequate volume   Culture   Final    NO GROWTH 5 DAYS Performed at [facility], [address], [place],  [address]    Report Status 01/01/2020 FINAL  Final  SARS CORONAVIRUS 2 (TAT 6-24 HRS) Nasopharyngeal Nasopharyngeal Swab     Status: None   Collection Time: 12/27/19 11:59 AM   Specimen: Nasopharyngeal Swab  Result Value Ref Range Status  SARS Coronavirus 2 NEGATIVE NEGATIVE Final    Comment: (NOTE) SARS-CoV-2 target nucleic acids are NOT DETECTED. The SARS-CoV-2 RNA is generally detectable in upper and lower respiratory specimens during the acute phase of infection. Negative results do not preclude SARS-CoV-2 infection, do not rule out co-infections with other pathogens, and should not be used as the sole basis for treatment or other patient management decisions. Negative results must be combined with clinical observations, patient history, and epidemiological information. The expected result is Negative. Fact Sheet for Patients: SugarRoll.be Fact Sheet for Healthcare Providers: https://www.woods-mathews.com/ This test is not yet approved or cleared by the [place] FDA and  has been authorized for detection and/or diagnosis of SARS-CoV-2 by FDA under an Emergency Use Authorization (EUA). This EUA will remain  in effect (meaning this test can be used) for the duration of the COVID-19 declaration under Section 56 4(b)(1) of the Act, 21 U.S.C. section 360bbb-3(b)(1), unless the authorization is terminated or revoked sooner. Performed at [facility],  [place] [address], [place], [place] [address]   MRSA PCR Screening     Status: None   Collection Time: 12/27/19  8:37 PM   Specimen: Nasopharyngeal  Result Value Ref Range Status   MRSA by PCR NEGATIVE NEGATIVE Final    Comment:        The GeneXpert MRSA Assay (FDA approved for NASAL specimens only), is one component of a comprehensive MRSA colonization surveillance program. It is not intended to diagnose MRSA infection nor to guide or monitor treatment for MRSA infections. Performed at [facility], [address] [place], [place] [address]   C difficile quick scan w PCR reflex     Status: Abnormal   Collection Time: 12/28/19  4:40 PM   Specimen: STOOL  Result Value Ref Range Status   C Diff antigen POSITIVE (A) NEGATIVE Final   C Diff toxin POSITIVE (A) NEGATIVE Final   C Diff interpretation Toxin producing C. difficile detected.  Final    Comment: CRITICAL RESULT CALLED TO, READ BACK BY AND VERIFIED WITH: SABRINA HESTER 12/28/19 @ [place] Performed at [facility], [address], [place], [place] [address]   Urine Culture     Status: Abnormal   Collection Time: 12/28/19  4:40 PM   Specimen: Urine, Random  Result Value Ref Range Status   Specimen Description URINE, RANDOM  Final   Special Requests   Final    NONE Performed at [facility], [address] [place], [place] [address]    Culture (A)  Final    >=100,000 COLONIES/mL MULTIPLE SPECIES PRESENT, SUGGEST RECOLLECTION   Report Status 12/30/2019 FINAL  Final  Chlamydia/NGC rt PCR ([place] only)     Status: None   Collection Time: 12/28/19  4:40 PM   Specimen: STOOL  Result Value Ref Range Status   Specimen source GC/Chlam URINE, RANDOM  Final   Chlamydia Tr NOT DETECTED NOT DETECTED Final   N gonorrhoeae NOT DETECTED NOT DETECTED Final    Comment: (NOTE) This CT/NG assay has not been evaluated in patients with a history of  hysterectomy. Performed at [facility], [address], [place],  [address]     RADIOLOGY:  No results found.   CODE STATUS:     Code Status Orders  (From admission, onward)         Start     Ordered   12/27/19 1407  Full code  Continuous  12/27/19 1413        Code Status History    Date Active Date Inactive Code Status Order ID Comments User Context   04/25/2019 0032 04/26/2019 2018 Full Code [id number]  Bernadette Hoit, DO Inpatient   04/24/2019 2311 04/25/2019 0032 Full Code [id number]  Bernadette Hoit, DO Inpatient   04/24/2019 0127 04/24/2019 1952 Full Code [id number]  Lance Coon, MD Inpatient   04/14/2019 1325 04/20/2019 1926 Full Code [id number]  Lady Deutscher, MD Inpatient   04/02/2019 0123 04/03/2019 1848 Full Code [id number]  Mansy, Arvella Merles, MD ED   03/30/2019 2349 04/01/2019 1809 Full Code [id number]  Mayer Camel, NP ED   03/06/2019 2256 03/07/2019 2039 Full Code [id number]  Mayer Camel, NP ED   02/14/2019 0456 02/15/2019 2030 Full Code [id number]  Harrie Foreman, MD Inpatient   02/02/2019 1729 02/04/2019 1804 Full Code [id number]  Hillary Bow, MD ED   01/03/2019 1944 01/04/2019 2003 Full Code [id number]  Salary, Avel Peace, MD Inpatient   02/23/2018 0126 02/24/2018 1539 Full Code [id number]  Lance Coon, MD ED   04/22/2017 0004 04/22/2017 2213 Full Code [id number]  Lance Coon, MD Inpatient   04/17/2017 2234 04/19/2017 2005 Full Code [id number]  [name], MD Inpatient   04/06/2016 1907 04/10/2016 1928 Full Code [id number]  Loletha Grayer, MD ED   Advance Care Planning Activity       TOTAL TIME TAKING CARE OF THIS PATIENT: *40 minutes.    Fritzi Mandes M.D  [facility]    CC: Primary care physician; [place]

## 2020-01-03 NOTE — Telephone Encounter (Signed)
Called @ 2:10 on 3/25. Left vm offering help w/ application process. Amparo Bristol

## 2020-01-03 NOTE — Discharge Instructions (Signed)
Patient advised to eat appropriate diet and  Follow up with PCP

## 2020-01-03 NOTE — Plan of Care (Signed)
  Problem: Education: Goal: Ability to describe self-care measures that may prevent or decrease complications (Diabetes Survival Skills Education) will improve Outcome: Progressing Goal: Individualized Educational Video(s) Outcome: Progressing   Problem: Cardiac: Goal: Ability to maintain an adequate cardiac output will improve Outcome: Progressing   Problem: Health Behavior/Discharge Planning: Goal: Ability to identify and utilize available resources and services will improve Outcome: Progressing Goal: Ability to manage health-related needs will improve Outcome: Progressing   Problem: Fluid Volume: Goal: Ability to achieve a balanced intake and output will improve Outcome: Progressing   Problem: Metabolic: Goal: Ability to maintain appropriate glucose levels will improve Outcome: Progressing   Problem: Nutritional: Goal: Maintenance of adequate nutrition will improve Outcome: Progressing Goal: Maintenance of adequate weight for body size and type will improve Outcome: Progressing   Problem: Respiratory: Goal: Will regain and/or maintain adequate ventilation Outcome: Progressing   Problem: Urinary Elimination: Goal: Ability to achieve and maintain adequate renal perfusion and functioning will improve Outcome: Progressing   Problem: Education: Goal: Knowledge of General Education information will improve Description: Including pain rating scale, medication(s)/side effects and non-pharmacologic comfort measures Outcome: Progressing   Problem: Health Behavior/Discharge Planning: Goal: Ability to manage health-related needs will improve Outcome: Progressing   Problem: Clinical Measurements: Goal: Ability to maintain clinical measurements within normal limits will improve Outcome: Progressing Goal: Will remain free from infection Outcome: Progressing Goal: Diagnostic test results will improve Outcome: Progressing Goal: Respiratory complications will improve Outcome:  Progressing Goal: Cardiovascular complication will be avoided Outcome: Progressing   Problem: Activity: Goal: Risk for activity intolerance will decrease Outcome: Progressing   Problem: Nutrition: Goal: Adequate nutrition will be maintained Outcome: Progressing   Problem: Coping: Goal: Level of anxiety will decrease Outcome: Progressing   Problem: Elimination: Goal: Will not experience complications related to bowel motility Outcome: Progressing Goal: Will not experience complications related to urinary retention Outcome: Progressing   Problem: Pain Managment: Goal: General experience of comfort will improve Outcome: Progressing   Problem: Safety: Goal: Ability to remain free from injury will improve Outcome: Progressing   Problem: Skin Integrity: Goal: Risk for impaired skin integrity will decrease Outcome: Progressing

## 2020-01-03 NOTE — Progress Notes (Signed)
Physical Therapy Treatment Patient Details Name: Gregory Moody MRN: [id number] DOB: December 11, 1975 Today's Date: 01/03/2020    History of Present Illness  44 y.o. male with medical history significant for diabetes mellitus with complications of gastroparesis and chronic kidney disease who was brought into the emergency room for evaluation of generalized weakness. Per EMS, police were called to patient's home to serve a warrant on another resident. When they noted the condition the patient was living in, APS was contacted and they recommended the patient be brought to the ER for further evaluation. Patient states that he has not had anything to eat for at least 5 days prior to coming in and complained of nausea and vomiting.    PT Comments    Pt was long sitting in bed upon arriving. He agrees to PT session and is cooperative throughout. Reports no pain and states" I'm ready to go home." Pt was able to exit R side of bed I'ly and stood and ambulated with CGA + gait belt. He did use RW this session and reports he will use at home. He was able to ambulate 200 ft with RW and ascend/descend 4 stair without difficulty. Pt returned to room and was repositioned in bed with bed alarm in place. Overall tolerated session well. Therapist recommends DC home with HHPT when medically cleared.    Follow Up Recommendations  Home health PT;Supervision - Intermittent     Equipment Recommendations  Rolling walker with 5" wheels    Recommendations for Other Services       Precautions / Restrictions Precautions Precautions: Fall Restrictions Weight Bearing Restrictions: No    Mobility  Bed Mobility Overal bed mobility: Independent                Transfers Overall transfer level: Modified independent Equipment used: Rolling walker (2 wheeled)             General transfer comment: Pt was willking and states he will use RW at home.   Ambulation/Gait Ambulation/Gait assistance: Min  guard Gait Distance (Feet): 200 Feet Assistive device: Rolling walker (2 wheeled) Gait Pattern/deviations: WFL(Within Functional Limits) Gait velocity: WNL   General Gait Details: no LOB with use of RW. HR less than 92bpm throughout session   Stairs Stairs: Yes Stairs assistance: Min guard Stair Management: Two rails Number of Stairs: 4 General stair comments: Pt was able to ascend/descend 4 stair with BUE support on rails without difficulty.   Wheelchair Mobility    Modified Rankin (Stroke Patients Only)       Balance                                            Cognition Arousal/Alertness: Awake/alert Behavior During Therapy: WFL for tasks assessed/performed Overall Cognitive Status: Within Functional Limits for tasks assessed                                 General Comments: Pt is A and O x 4       Exercises      General Comments        Pertinent Vitals/Pain Pain Assessment: No/denies pain    Home Living                      Prior Function  PT Goals (current goals can now be found in the care plan section) Acute Rehab PT Goals Patient Stated Goal: pt wants to go home Progress towards PT goals: Progressing toward goals    Frequency    Min 2X/week      PT Plan Current plan remains appropriate    Co-evaluation              AM-PAC PT "6 Clicks" Mobility   Outcome Measure  Help needed turning from your back to your side while in a flat bed without using bedrails?: None Help needed moving from lying on your back to sitting on the side of a flat bed without using bedrails?: None Help needed moving to and from a bed to a chair (including a wheelchair)?: None Help needed standing up from a chair using your arms (e.g., wheelchair or bedside chair)?: None Help needed to walk in hospital room?: A Little Help needed climbing 3-5 steps with a railing? : A Little 6 Click Score: 22    End of  Session Equipment Utilized During Treatment: Gait belt Activity Tolerance: Patient tolerated treatment well Patient left: in bed;with bed alarm set;with call bell/phone within reach Nurse Communication: Mobility status PT Visit Diagnosis: Unsteadiness on feet (R26.81);Muscle weakness (generalized) (M62.81)     Time: 1005-1020 PT Time Calculation (min) (ACUTE ONLY): 15 min  Charges:  $Gait Training: 8-22 mins                    Julaine Fusi PTA 01/03/20, 11:03 AM

## 2020-01-03 NOTE — Discharge Planning (Addendum)
IVs and tele removed.  RN assessment and VS revealed stability for DC to home.  Discharge papers given, explained and educated. Told of needed FU appt and that the Dr. Lynnda Child be contacting to set up.  Med management able to provide meds and delivered prior to DC.  Personal items (wallet and cash) returned from security.  Once ready, will be wheeled to front and family transporting home via car.  Patient went into bathroom and got self dressed for home.  Once I arrived in room, he was sitting oin bed and asked to get into [facility] to be ready to go.  Patient stood and sat in [facility] and I noted an OFF smell and brown stain on the bed sheets where he was sitting.  I asked him if he was clean and might needed some help, before he went home - Patient responded, " no, my pants are just dirty and I'll fix when I get home." [place] A/O x4 and refused to be cleaned up. NT also informed and asked to bring a few disposable chucks when placed in his car. - Waiting on family to arrive

## 2020-01-04 LAB — VITAMIN B1: Vitamin B1 (Thiamine): 71.7 nmol/L (ref 66.5–200.0)

## 2020-01-07 ENCOUNTER — Telehealth: Payer: Self-pay | Admitting: Gerontology

## 2020-01-07 NOTE — Telephone Encounter (Signed)
Spoke to pt about documents he needs to become a new pt.  He will stop by tomorrow to bring documentation and fill out an app. [facility]

## 2020-01-21 ENCOUNTER — Emergency Department: Payer: Self-pay

## 2020-01-21 ENCOUNTER — Inpatient Hospital Stay
Admission: EM | Admit: 2020-01-21 | Discharge: 2020-01-30 | DRG: 371 | Disposition: A | Discharge status: Home Health | Payer: Self-pay | Attending: Internal Medicine | Admitting: Internal Medicine

## 2020-01-21 ENCOUNTER — Other Ambulatory Visit: Payer: Self-pay

## 2020-01-21 DIAGNOSIS — E860 Dehydration: Secondary | ICD-10-CM | POA: Diagnosis present

## 2020-01-21 DIAGNOSIS — A09 Infectious gastroenteritis and colitis, unspecified: Secondary | ICD-10-CM

## 2020-01-21 DIAGNOSIS — E119 Type 2 diabetes mellitus without complications: Secondary | ICD-10-CM

## 2020-01-21 DIAGNOSIS — R64 Cachexia: Secondary | ICD-10-CM | POA: Diagnosis present

## 2020-01-21 DIAGNOSIS — K219 Gastro-esophageal reflux disease without esophagitis: Secondary | ICD-10-CM | POA: Diagnosis present

## 2020-01-21 DIAGNOSIS — Z8616 Personal history of COVID-19: Secondary | ICD-10-CM

## 2020-01-21 DIAGNOSIS — Z8611 Personal history of tuberculosis: Secondary | ICD-10-CM

## 2020-01-21 DIAGNOSIS — E1165 Type 2 diabetes mellitus with hyperglycemia: Secondary | ICD-10-CM | POA: Diagnosis present

## 2020-01-21 DIAGNOSIS — A0472 Enterocolitis due to Clostridium difficile, not specified as recurrent: Principal | ICD-10-CM | POA: Diagnosis present

## 2020-01-21 DIAGNOSIS — R197 Diarrhea, unspecified: Secondary | ICD-10-CM | POA: Diagnosis present

## 2020-01-21 DIAGNOSIS — E43 Unspecified severe protein-calorie malnutrition: Secondary | ICD-10-CM | POA: Diagnosis present

## 2020-01-21 DIAGNOSIS — L89022 Pressure ulcer of left elbow, stage 2: Secondary | ICD-10-CM | POA: Diagnosis present

## 2020-01-21 DIAGNOSIS — Z20822 Contact with and (suspected) exposure to covid-19: Secondary | ICD-10-CM | POA: Diagnosis present

## 2020-01-21 DIAGNOSIS — Z833 Family history of diabetes mellitus: Secondary | ICD-10-CM

## 2020-01-21 DIAGNOSIS — R627 Adult failure to thrive: Secondary | ICD-10-CM | POA: Diagnosis present

## 2020-01-21 DIAGNOSIS — I959 Hypotension, unspecified: Secondary | ICD-10-CM | POA: Diagnosis present

## 2020-01-21 DIAGNOSIS — Z794 Long term (current) use of insulin: Secondary | ICD-10-CM

## 2020-01-21 DIAGNOSIS — K640 First degree hemorrhoids: Secondary | ICD-10-CM | POA: Diagnosis present

## 2020-01-21 DIAGNOSIS — D539 Nutritional anemia, unspecified: Secondary | ICD-10-CM | POA: Diagnosis present

## 2020-01-21 DIAGNOSIS — R339 Retention of urine, unspecified: Secondary | ICD-10-CM | POA: Diagnosis present

## 2020-01-21 DIAGNOSIS — Z7982 Long term (current) use of aspirin: Secondary | ICD-10-CM

## 2020-01-21 DIAGNOSIS — N179 Acute kidney failure, unspecified: Secondary | ICD-10-CM | POA: Diagnosis present

## 2020-01-21 DIAGNOSIS — Z8619 Personal history of other infectious and parasitic diseases: Secondary | ICD-10-CM

## 2020-01-21 DIAGNOSIS — D638 Anemia in other chronic diseases classified elsewhere: Secondary | ICD-10-CM | POA: Diagnosis present

## 2020-01-21 DIAGNOSIS — R338 Other retention of urine: Secondary | ICD-10-CM

## 2020-01-21 DIAGNOSIS — Z720 Tobacco use: Secondary | ICD-10-CM | POA: Diagnosis present

## 2020-01-21 DIAGNOSIS — F17200 Nicotine dependence, unspecified, uncomplicated: Secondary | ICD-10-CM | POA: Diagnosis present

## 2020-01-21 DIAGNOSIS — Z79899 Other long term (current) drug therapy: Secondary | ICD-10-CM

## 2020-01-21 DIAGNOSIS — E875 Hyperkalemia: Secondary | ICD-10-CM | POA: Diagnosis present

## 2020-01-21 DIAGNOSIS — Z681 Body mass index (BMI) 19 or less, adult: Secondary | ICD-10-CM

## 2020-01-21 DIAGNOSIS — R112 Nausea with vomiting, unspecified: Secondary | ICD-10-CM

## 2020-01-21 LAB — GLUCOSE, CAPILLARY
Glucose-Capillary: 266 mg/dL — ABNORMAL HIGH (ref 70–99)
Glucose-Capillary: 308 mg/dL — ABNORMAL HIGH (ref 70–99)
Glucose-Capillary: 223 mg/dL — ABNORMAL HIGH (ref 70–99)
Glucose-Capillary: 345 mg/dL — ABNORMAL HIGH (ref 70–99)

## 2020-01-21 LAB — CBC WITH DIFFERENTIAL/PLATELET
Abs Immature Granulocytes: 0.01 10*3/uL (ref 0.00–0.07)
Basophils Absolute: 0 10*3/uL (ref 0.0–0.1)
Basophils Relative: 0 %
Eosinophils Absolute: 0 10*3/uL (ref 0.0–0.5)
Eosinophils Relative: 1 %
HCT: 26.9 % — ABNORMAL LOW (ref 39.0–52.0)
Hemoglobin: 8.7 g/dL — ABNORMAL LOW (ref 13.0–17.0)
Immature Granulocytes: 0 %
Lymphocytes Relative: 20 %
Lymphs Abs: 1.3 10*3/uL (ref 0.7–4.0)
MCH: 30.4 pg (ref 26.0–34.0)
MCHC: 32.3 g/dL (ref 30.0–36.0)
MCV: 94.1 fL (ref 80.0–100.0)
Monocytes Absolute: 0.3 10*3/uL (ref 0.1–1.0)
Monocytes Relative: 5 %
Neutro Abs: 4.8 10*3/uL (ref 1.7–7.7)
Neutrophils Relative %: 74 %
Platelets: 420 10*3/uL — ABNORMAL HIGH (ref 150–400)
RBC: 2.86 MIL/uL — ABNORMAL LOW (ref 4.22–5.81)
RDW: 13.5 % (ref 11.5–15.5)
WBC: 6.4 10*3/uL (ref 4.0–10.5)
nRBC: 0 % (ref 0.0–0.2)

## 2020-01-21 LAB — COMPREHENSIVE METABOLIC PANEL
ALT: 29 U/L (ref 0–44)
AST: 21 U/L (ref 15–41)
Albumin: 3 g/dL — ABNORMAL LOW (ref 3.5–5.0)
Alkaline Phosphatase: 108 U/L (ref 38–126)
Anion gap: 8 (ref 5–15)
BUN: 22 mg/dL — ABNORMAL HIGH (ref 6–20)
CO2: 21 mmol/L — ABNORMAL LOW (ref 22–32)
Calcium: 8.6 mg/dL — ABNORMAL LOW (ref 8.9–10.3)
Chloride: 98 mmol/L (ref 98–111)
Creatinine, Ser: 1.45 mg/dL — ABNORMAL HIGH (ref 0.61–1.24)
GFR calc Af Amer: >60 mL/min (ref >60)
GFR calc non Af Amer: 58 mL/min — ABNORMAL LOW (ref >60)
Glucose, Bld: 448 mg/dL — ABNORMAL HIGH (ref 70–99)
Potassium: 4.6 mmol/L (ref 3.5–5.1)
Sodium: 127 mmol/L — ABNORMAL LOW (ref 135–145)
Total Bilirubin: 0.5 mg/dL (ref 0.3–1.2)
Total Protein: 7.5 g/dL (ref 6.5–8.1)

## 2020-01-21 LAB — RESPIRATORY PANEL BY RT PCR (FLU A&B, COVID)
Influenza A by PCR: NEGATIVE
Influenza B by PCR: NEGATIVE
SARS Coronavirus 2 by RT PCR: NEGATIVE

## 2020-01-21 LAB — BLOOD GAS, VENOUS
Acid-base deficit: 4 mmol/L — ABNORMAL HIGH (ref 0.0–2.0)
Bicarbonate: 22.6 mmol/L (ref 20.0–28.0)
O2 Saturation: 71.4 %
Patient temperature: 37
pCO2, Ven: 46 mmHg (ref 44.0–60.0)
pH, Ven: 7.3 (ref 7.250–7.430)
pO2, Ven: 42 mmHg (ref 32.0–45.0)

## 2020-01-21 LAB — LACTIC ACID, PLASMA
Lactic Acid, Venous: 1.2 mmol/L (ref 0.5–1.9)
Lactic Acid, Venous: 1.5 mmol/L (ref 0.5–1.9)

## 2020-01-21 LAB — HIV ANTIBODY (ROUTINE TESTING W REFLEX): HIV Screen 4th Generation wRfx: NONREACTIVE

## 2020-01-21 LAB — LIPASE, BLOOD: Lipase: 28 U/L (ref 11–51)

## 2020-01-21 IMAGING — CT CT ABD-PELV W/O CM
2 of 4 series · 16 of 46 positions shown, 18 images · non-contrast
Comparison: CT abdomen and pelvis [DATE]. Chest CT [DATE].

CLINICAL DATA: Abdominal pain with diarrhea. Recent history of C
difficile infection.

EXAM:
CT ABDOMEN AND PELVIS WITHOUT CONTRAST
TECHNIQUE: Multidetector CT imaging of the abdomen and pelvis was performed
following the standard protocol without IV contrast.

[Series 2: routine abd/pel wo · axial · 0.68mm/px · z∈[-1139,-729]mm · 13 of 90 slices shown, 15 images]
[im 4/90  soft-tissue]
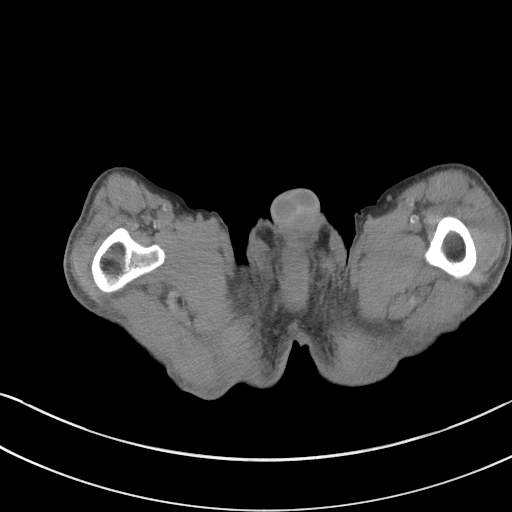
[im 4/90  bone]
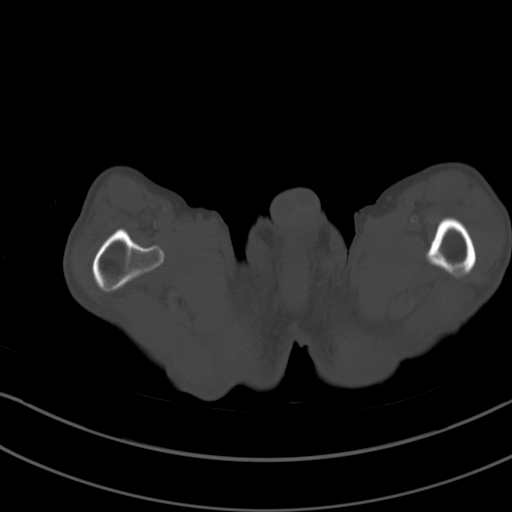
[im 12/90  soft-tissue]
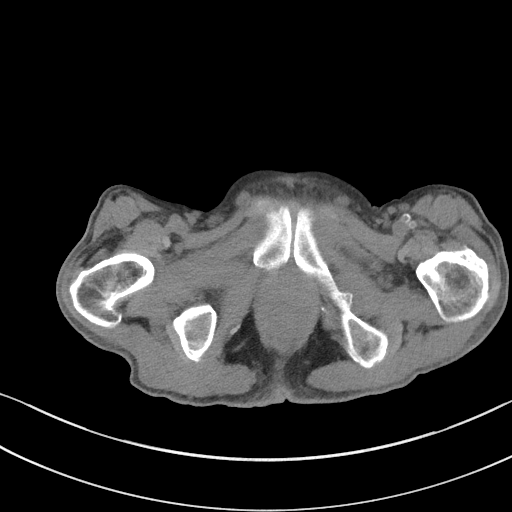
[im 19/90  soft-tissue]
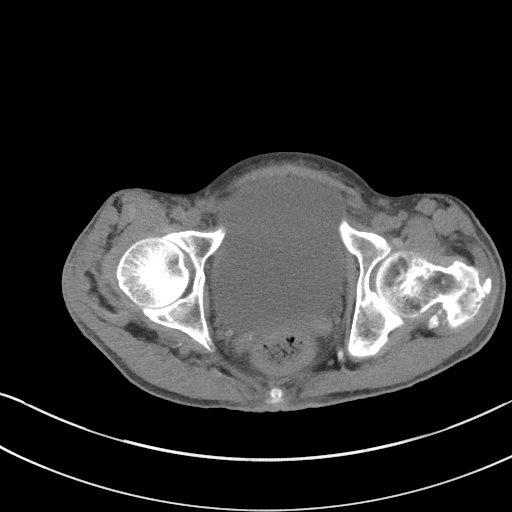
[im 26/90  soft-tissue]
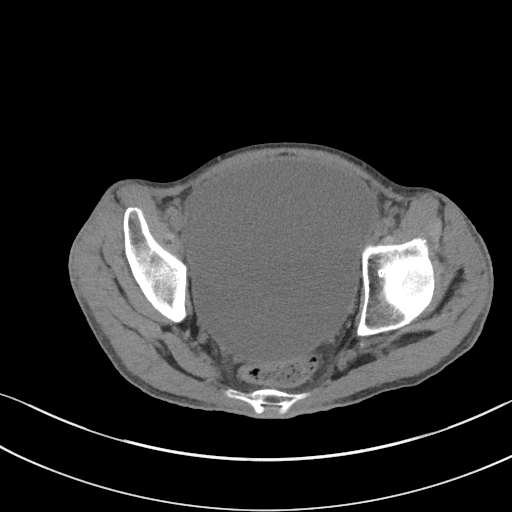
[im 30/90  soft-tissue]
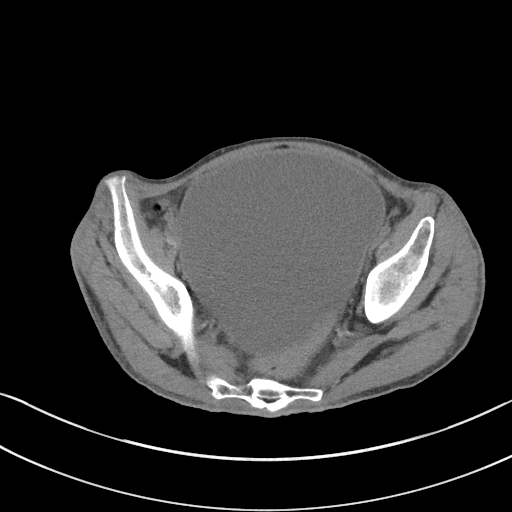
[im 38/90  soft-tissue]
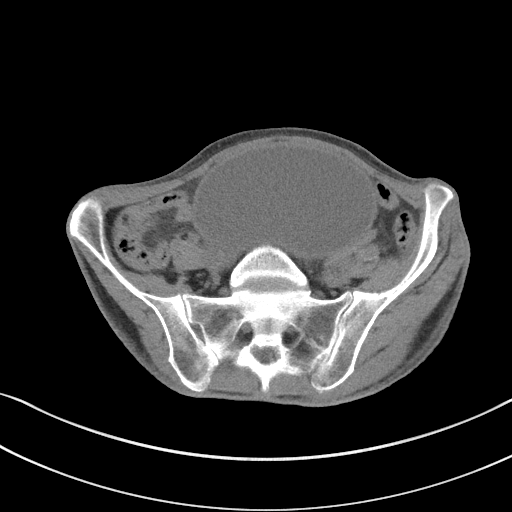
[im 45/90  soft-tissue]
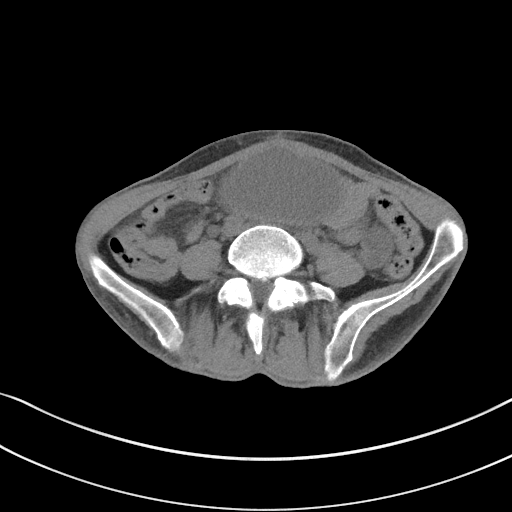
[im 52/90  soft-tissue]
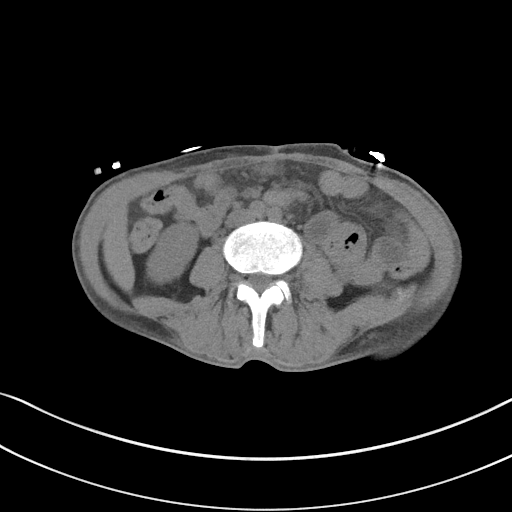
[im 60/90  soft-tissue]
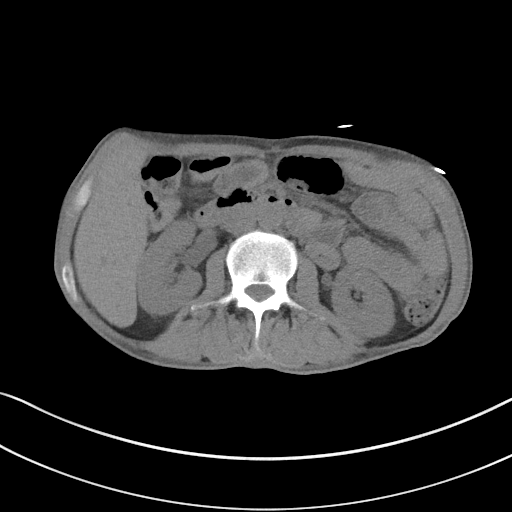
[im 60/90  bone]
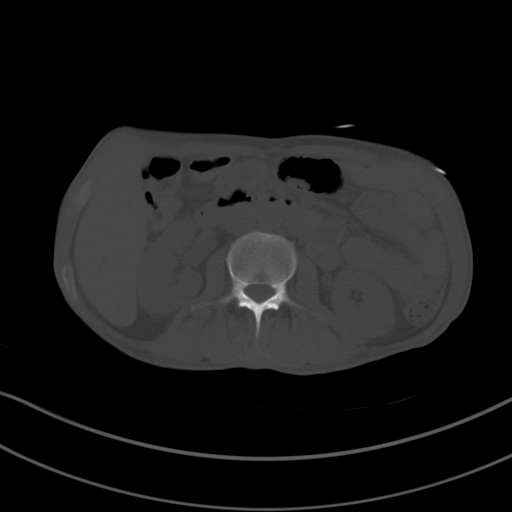
[im 64/90  soft-tissue]
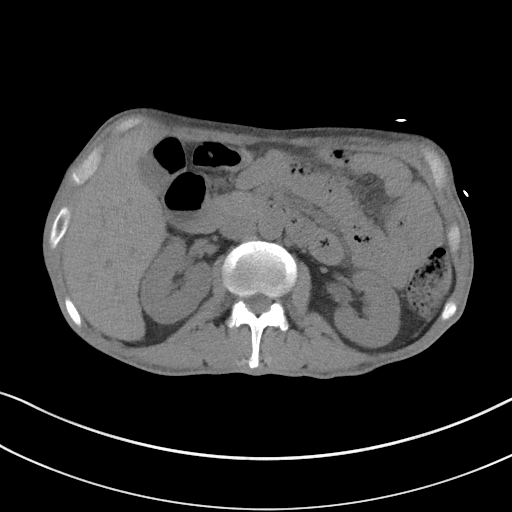
[im 71/90  soft-tissue]
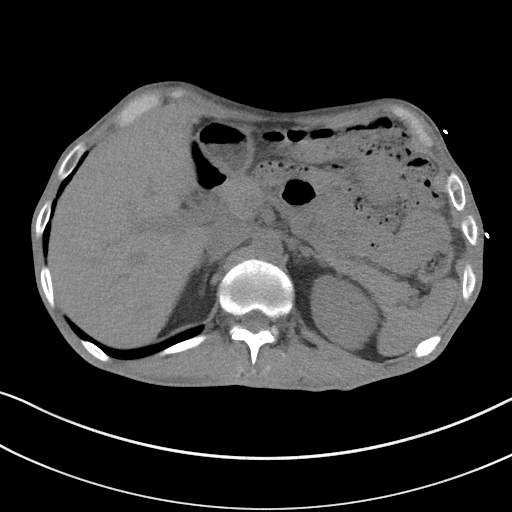
[im 78/90  soft-tissue]
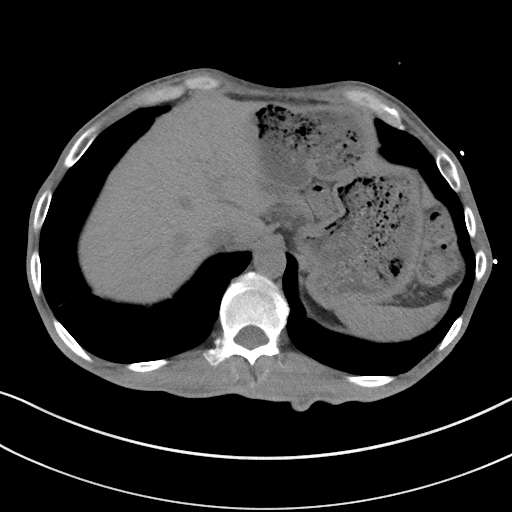
[im 86/90  soft-tissue]
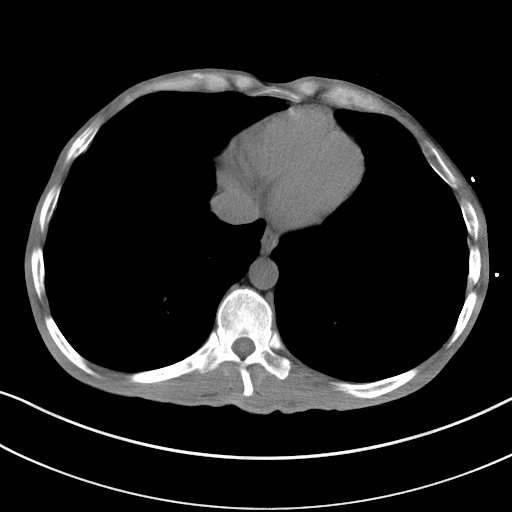

[Series 5: coronal st · coronal · 0.65mm/px · 3 of 81 slices shown]
[im 27/81  soft-tissue]
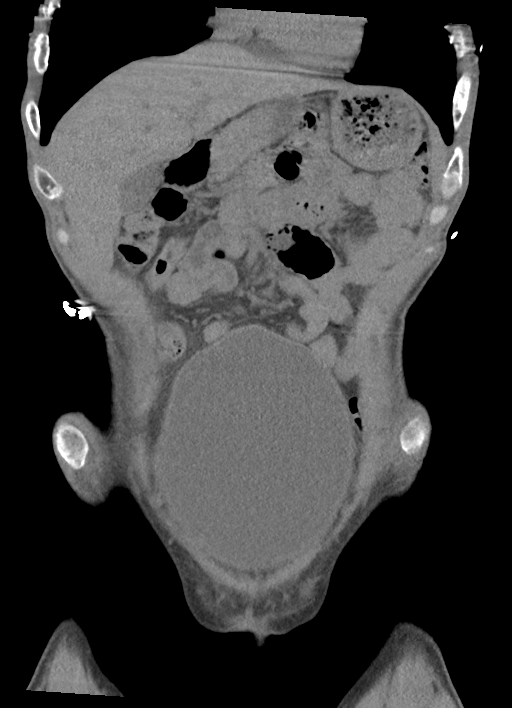
[im 36/81  soft-tissue]
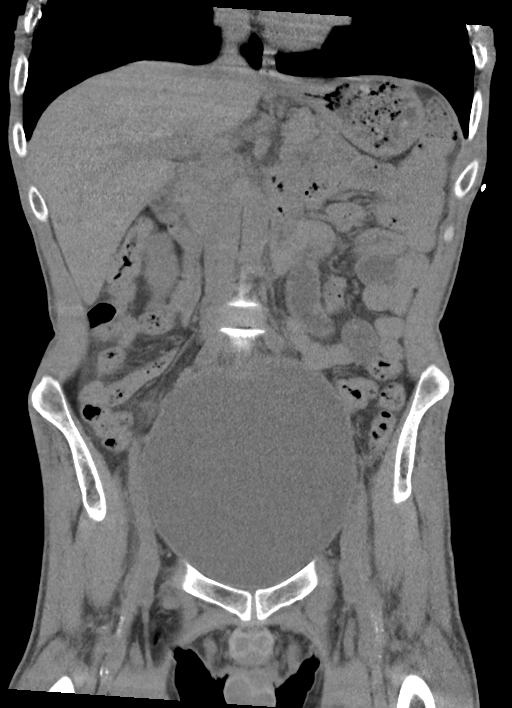
[im 45/81  soft-tissue]
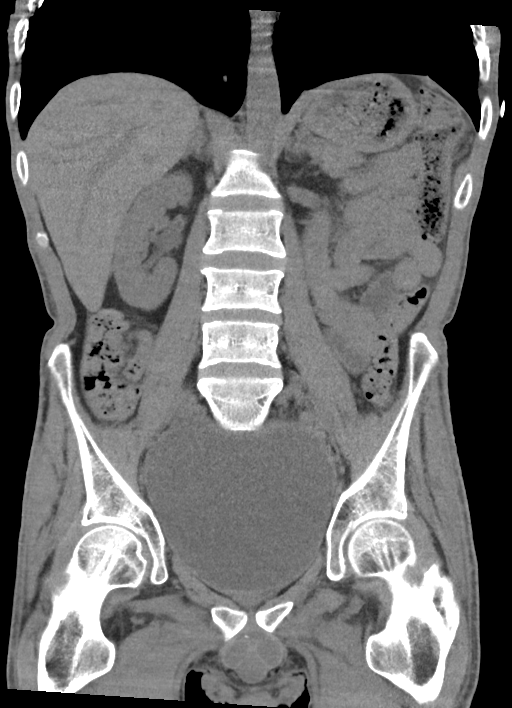

[16 of 46 positions shown; findings below may reference images not displayed]

FINDINGS: Lower chest: Motion artifact through the lung bases with partially
visualized micronodularity in the lower lobes and focal nodular
opacity in the lateral aspect of the lingula, overall substantially
improved from the recent prior chest CT. No pleural effusion.

Hepatobiliary: Punctate calcification in the posterior right hepatic
lobe. Unremarkable gallbladder. No biliary dilatation.

Pancreas: Unremarkable.

Spleen: Unremarkable.

Adrenals/Urinary Tract: Unremarkable adrenal glands. No evidence of
renal mass, calculi, or hydronephrosis. Markedly distended bladder.

Stomach/Bowel: The stomach is mildly distended by ingested material.
Relative lack of significant small or large bowel distension limits
assessment for wall thickening. There is the suggestion of mild
rectal wall thickening with mild haziness in the surrounding fat.
There is no evidence of bowel obstruction. The appendix is
unremarkable.

Vascular/Lymphatic: Normal caliber of the abdominal aorta. No
enlarged lymph nodes.

Reproductive: Unremarkable prostate.

Other: No ascites or pneumoperitoneum.

Musculoskeletal: Remote fractures of the left ischium and proximal
left femur. Suspected old healed L5 pars defects with grade 1
anterolisthesis of L5 on S1.
IMPRESSION: 1. Markedly distended bladder.
2. Mild rectal wall thickening which may reflect proctitis.
3. Improved nodularity in the lung bases since a [DATE] chest
CT.

## 2020-01-21 MED ORDER — VANCOMYCIN 50 MG/ML ORAL SOLUTION
125.0000 mg | Freq: Four times a day (QID) | ORAL | Status: DC
  Administered 2020-01-21 – 2020-01-22 (×4): 125 mg via ORAL
  Filled 2020-01-21 (×7): qty 2.5

## 2020-01-21 MED ORDER — ENOXAPARIN SODIUM 40 MG/0.4ML ~~LOC~~ SOLN
40.0000 mg | SUBCUTANEOUS | Status: DC
  Administered 2020-01-21 – 2020-01-29 (×9): 40 mg via SUBCUTANEOUS
  Filled 2020-01-21 (×9): qty 0.4

## 2020-01-21 MED ORDER — NICOTINE 21 MG/24HR TD PT24
21.0000 mg | MEDICATED_PATCH | Freq: Every day | TRANSDERMAL | Status: DC
  Administered 2020-01-21 – 2020-01-30 (×10): 21 mg via TRANSDERMAL
  Filled 2020-01-21 (×10): qty 1

## 2020-01-21 MED ORDER — ASPIRIN EC 81 MG PO TBEC
81.0000 mg | DELAYED_RELEASE_TABLET | Freq: Every day | ORAL | Status: DC
  Administered 2020-01-21 – 2020-01-30 (×9): 81 mg via ORAL
  Filled 2020-01-21 (×9): qty 1

## 2020-01-21 MED ORDER — PANTOPRAZOLE SODIUM 40 MG PO TBEC: 40.0000 mg | DELAYED_RELEASE_TABLET | Freq: Two times a day (BID) | ORAL | Status: DC

## 2020-01-21 MED ORDER — SODIUM CHLORIDE 0.9 % IV BOLUS
1000.0000 mL | Freq: Once | INTRAVENOUS | Status: AC
  Administered 2020-01-21: 13:00:00 1000 mL via INTRAVENOUS

## 2020-01-21 MED ORDER — INSULIN ASPART 100 UNIT/ML ~~LOC~~ SOLN
0.0000 [IU] | SUBCUTANEOUS | Status: DC
  Administered 2020-01-21: 7 [IU] via SUBCUTANEOUS
  Administered 2020-01-21: 5 [IU] via SUBCUTANEOUS
  Administered 2020-01-22: 2 [IU] via SUBCUTANEOUS
  Administered 2020-01-22: 7 [IU] via SUBCUTANEOUS
  Administered 2020-01-22: 2 [IU] via SUBCUTANEOUS
  Administered 2020-01-22: 7 [IU] via SUBCUTANEOUS
  Administered 2020-01-22: 2 [IU] via SUBCUTANEOUS
  Administered 2020-01-22: 3 [IU] via SUBCUTANEOUS
  Administered 2020-01-23: 7 [IU] via SUBCUTANEOUS
  Administered 2020-01-23: 5 [IU] via SUBCUTANEOUS
  Administered 2020-01-23: 2 [IU] via SUBCUTANEOUS
  Administered 2020-01-23 – 2020-01-24 (×2): 3 [IU] via SUBCUTANEOUS
  Administered 2020-01-24 (×2): 2 [IU] via SUBCUTANEOUS
  Administered 2020-01-24: 1 [IU] via SUBCUTANEOUS
  Administered 2020-01-24: 3 [IU] via SUBCUTANEOUS
  Administered 2020-01-24: 2 [IU] via SUBCUTANEOUS
  Administered 2020-01-25: 3 [IU] via SUBCUTANEOUS
  Administered 2020-01-25: 5 [IU] via SUBCUTANEOUS
  Administered 2020-01-25: 2 [IU] via SUBCUTANEOUS
  Administered 2020-01-26 (×2): 5 [IU] via SUBCUTANEOUS
  Administered 2020-01-26: 2 [IU] via SUBCUTANEOUS
  Administered 2020-01-26 – 2020-01-27 (×4): 3 [IU] via SUBCUTANEOUS
  Administered 2020-01-27: 5 [IU] via SUBCUTANEOUS
  Administered 2020-01-27: 3 [IU] via SUBCUTANEOUS
  Administered 2020-01-27: 2 [IU] via SUBCUTANEOUS
  Administered 2020-01-27: 1 [IU] via SUBCUTANEOUS
  Administered 2020-01-27: 5 [IU] via SUBCUTANEOUS
  Administered 2020-01-28 (×2): 3 [IU] via SUBCUTANEOUS
  Administered 2020-01-28 (×2): 7 [IU] via SUBCUTANEOUS
  Administered 2020-01-28: 1 [IU] via SUBCUTANEOUS
  Administered 2020-01-28 – 2020-01-29 (×2): 2 [IU] via SUBCUTANEOUS
  Administered 2020-01-29: 5 [IU] via SUBCUTANEOUS
  Administered 2020-01-29: 2 [IU] via SUBCUTANEOUS
  Administered 2020-01-29 (×2): 7 [IU] via SUBCUTANEOUS
  Administered 2020-01-30 (×2): 3 [IU] via SUBCUTANEOUS
  Administered 2020-01-30: 7 [IU] via SUBCUTANEOUS
  Filled 2020-01-21 (×47): qty 1

## 2020-01-21 MED ORDER — ENSURE ENLIVE PO LIQD
237.0000 mL | Freq: Two times a day (BID) | ORAL | Status: DC
  Administered 2020-01-22 – 2020-01-23 (×3): 237 mL via ORAL

## 2020-01-21 MED ORDER — INSULIN ASPART 100 UNIT/ML ~~LOC~~ SOLN
4.0000 [IU] | Freq: Once | SUBCUTANEOUS | Status: AC
  Administered 2020-01-21: 14:00:00 4 [IU] via INTRAVENOUS
  Filled 2020-01-21: qty 1

## 2020-01-21 MED ORDER — ACETAMINOPHEN 325 MG PO TABS
650.0000 mg | ORAL_TABLET | Freq: Four times a day (QID) | ORAL | Status: DC | PRN
  Filled 2020-01-21: qty 2

## 2020-01-21 MED ORDER — ADULT MULTIVITAMIN W/MINERALS CH
1.0000 | ORAL_TABLET | Freq: Every day | ORAL | Status: DC
  Administered 2020-01-21 – 2020-01-30 (×8): 1 via ORAL
  Filled 2020-01-21 (×9): qty 1

## 2020-01-21 MED ORDER — FLUOXETINE HCL 10 MG PO CAPS
10.0000 mg | ORAL_CAPSULE | Freq: Every day | ORAL | Status: DC
  Administered 2020-01-21 – 2020-01-30 (×9): 10 mg via ORAL
  Filled 2020-01-21 (×11): qty 1

## 2020-01-21 MED ORDER — SODIUM CHLORIDE 0.9 % IV BOLUS
1000.0000 mL | Freq: Once | INTRAVENOUS | Status: AC
  Administered 2020-01-21: 1000 mL via INTRAVENOUS

## 2020-01-21 MED ORDER — CIPROFLOXACIN IN D5W 400 MG/200ML IV SOLN
400.0000 mg | Freq: Two times a day (BID) | INTRAVENOUS | Status: DC
  Administered 2020-01-21 – 2020-01-22 (×3): 400 mg via INTRAVENOUS
  Filled 2020-01-21 (×4): qty 200

## 2020-01-21 MED ORDER — SODIUM CHLORIDE 0.9 % IV SOLN: INTRAVENOUS | Status: DC

## 2020-01-21 MED ORDER — ONDANSETRON HCL 4 MG/2ML IJ SOLN: 4.0000 mg | Freq: Three times a day (TID) | INTRAMUSCULAR | Status: DC | PRN

## 2020-01-21 MED ORDER — SUCRALFATE 1 G PO TABS
1.0000 g | ORAL_TABLET | Freq: Three times a day (TID) | ORAL | Status: DC
  Administered 2020-01-21 – 2020-01-30 (×32): 1 g via ORAL
  Filled 2020-01-21 (×31): qty 1

## 2020-01-21 MED ORDER — INSULIN GLARGINE 100 UNIT/ML ~~LOC~~ SOLN
8.0000 [IU] | Freq: Every day | SUBCUTANEOUS | Status: DC
  Administered 2020-01-21: 22:00:00 8 [IU] via SUBCUTANEOUS
  Filled 2020-01-21 (×2): qty 0.08

## 2020-01-21 NOTE — ED Provider Notes (Signed)
[facility] Emergency Department Provider Note  ____________________________________________   None    (approximate)  I have reviewed the triage vital signs and the nursing notes.   HISTORY  Chief Complaint Diarrhea    HPI Gregory Moody is a 44 y.o. male with history of diabetes, tuberculosis, prior COVID-19, gastroparesis, here with multiple complaints.  The patient states that over the last several days, he has felt increasingly weak.  He has had nausea, vomiting, and loose stools.  He states his blood sugars have been elevating.  He states he has been taking his insulin as prescribed.  He does not recall or know what triggered this, and states he has not been sick.  He denies any cough or shortness of breath.  He states he was recently diagnosed with pneumonia but has recovered from this.  Denies any blood in his stool.    Of note, the patient was admitted at the end of last month with DKA and esophageal candidiasis.  Patient also had acute kidney injury.  He also had C. difficile and was placed on p.o. vancomycin.  He has not had antibiotics since then.        Past Medical History:  Diagnosis Date  . COVID-19   . Diabetes mellitus without complication ([place])   . Gastroparesis   . Tuberculosis     Patient Active Problem List   Diagnosis Date Noted  . Diarrhea 01/21/2020  . Tobacco abuse 01/21/2020  . C. difficile colitis 01/21/2020  . Acute urinary retention 01/21/2020  . Protein-calorie malnutrition, severe 01/02/2020  . Odynophagia   . DKA (diabetic ketoacidoses) ([place]) 12/27/2019  . Depression 12/27/2019  . DKA, type 2 ([place]) 12/27/2019  . Anemia of chronic disease 04/25/2019  . Hypomagnesemia 04/25/2019  . Hypotension 04/23/2019  . CAP (community acquired pneumonia) due to MSSA (methicillin sensitive Staphylococcus aureus) ([place]) 04/14/2019  . COVID-19 virus infection 04/14/2019  . Nausea and vomiting 04/02/2019  . Nausea  vomiting and diarrhea   . Pressure injury of skin 03/31/2019  . Acute renal failure (ARF) ([place]) 03/06/2019  . CAP (community acquired pneumonia) 02/14/2019  . AKI (acute kidney injury) ([place]) 02/02/2019  . ARF (acute renal failure) ([place]) 01/03/2019  . Malnutrition of moderate degree 02/24/2018  . GERD (gastroesophageal reflux disease) 02/23/2018  . Diabetic gastroparesis ([place]) 02/23/2018  . Diabetic foot ulcer ([place]) 02/23/2018  . Aspiration pneumonia ([place]) 04/21/2017  . Hypoglycemia 04/21/2017  . Diabetes mellitus with hyperglycemia ([place]) 04/21/2017  . Hip fracture, unspecified laterality, closed, initial encounter ([place]) 04/17/2017  . Hyperglycemia 04/17/2017  . Malnutrition ([place]) 04/09/2016  . Cavitary lesion of lung 04/06/2016    Past Surgical History:  Procedure Laterality Date  . ESOPHAGOGASTRODUODENOSCOPY N/A 02/03/2019   Procedure: ESOPHAGOGASTRODUODENOSCOPY (EGD);  Surgeon: [place], Benay Pike, MD;  Location: [facility] ENDOSCOPY;  Service: Gastroenterology;  Laterality: N/A;    Prior to Admission medications   Medication Sig Start Date End Date Taking? Authorizing Provider  aspirin EC 81 MG tablet Take 81 mg by mouth daily.   Yes [provider]  fluconazole (DIFLUCAN) 200 MG tablet Take 1 tablet (200 mg total) by mouth daily. 01/03/20  Yes Fritzi Mandes, MD  FLUoxetine (PROZAC) 10 MG capsule Take 10 mg by mouth daily.   Yes [provider]  insulin aspart (NOVOLOG) 100 UNIT/ML injection Inject 2 Units into the skin 3 (three) times daily with meals. 01/03/20  Yes Fritzi Mandes, MD  insulin glargine (LANTUS) 100 UNIT/ML injection Inject 0.1 mLs (10 Units  total) into the skin at bedtime. 01/03/20  Yes Fritzi Mandes, MD  loperamide (IMODIUM) 2 MG capsule Take 2 mg by mouth as needed for diarrhea or loose stools.   Yes [provider]  Multiple Vitamin (MULTIVITAMIN WITH MINERALS) TABS tablet Take 1 tablet by mouth daily. 01/03/20  Yes Fritzi Mandes, MD  pantoprazole  (PROTONIX) 40 MG tablet Take 1 tablet (40 mg total) by mouth 2 (two) times daily before a meal. 01/03/20  Yes Fritzi Mandes, MD  sucralfate (CARAFATE) 1 g tablet Take 1 tablet (1 g total) by mouth 4 (four) times daily -  with meals and at bedtime. 01/03/20  Yes Fritzi Mandes, MD  vancomycin (VANCOCIN) 125 MG capsule Take 125 mg by mouth 4 (four) times daily. 01/03/20  Yes [provider]    Allergies Patient has no known allergies.  Family History  Problem Relation Age of Onset  . Diabetes Mother   . Diabetes Father     Social History Social History   Tobacco Use  . Smoking status: Current Some Day Smoker  . Smokeless tobacco: Never Used  Substance Use Topics  . Alcohol use: No  . Drug use: No    Review of Systems  Review of Systems  Constitutional: Positive for fatigue. Negative for chills and fever.  HENT: Negative for sore throat.   Respiratory: Positive for cough. Negative for shortness of breath.   Cardiovascular: Negative for chest pain.  Gastrointestinal: Positive for abdominal pain, diarrhea, nausea and vomiting.  Genitourinary: Negative for flank pain.  Musculoskeletal: Negative for neck pain.  Skin: Negative for rash and wound.  Allergic/Immunologic: Negative for immunocompromised state.  Neurological: Positive for weakness. Negative for numbness.  Hematological: Does not bruise/bleed easily.  All other systems reviewed and are negative.    ____________________________________________  PHYSICAL EXAM:      VITAL SIGNS: ED Triage Vitals  Enc Vitals Group     BP      Pulse      Resp      Temp      Temp src      SpO2      Weight      Height      Head Circumference      Peak Flow      Pain Score      Pain Loc      Pain Edu?      Excl. in [place]?      Physical Exam Vitals and nursing note reviewed.  Constitutional:      General: He is in acute distress.     Appearance: He is well-developed.     Comments: Cachectic, chronically ill appearing    HENT:     Head: Normocephalic and atraumatic.     Mouth/Throat:     Mouth: Mucous membranes are dry.  Eyes:     Conjunctiva/sclera: Conjunctivae normal.  Cardiovascular:     Rate and Rhythm: Normal rate and regular rhythm.     Heart sounds: Normal heart sounds. No murmur. No friction rub.  Pulmonary:     Effort: Pulmonary effort is normal. No respiratory distress.     Breath sounds: Normal breath sounds. No wheezing or rales.  Abdominal:     General: There is no distension.     Palpations: Abdomen is soft.     Tenderness: There is no abdominal tenderness.  Musculoskeletal:     Cervical back: Neck supple.  Skin:    Capillary Refill: Capillary refill takes 2 to 3  seconds.     Comments: Pressure sores noted b/l UE and LE, poor skin turgor  Neurological:     Mental Status: He is alert and oriented to person, place, and time.     Motor: No abnormal muscle tone.       ____________________________________________   LABS (all labs ordered are listed, but only abnormal results are displayed)  Labs Reviewed  CBC WITH DIFFERENTIAL/PLATELET - Abnormal; Notable for the following components:      Result Value   RBC 2.86 (*)    Hemoglobin 8.7 (*)    HCT 26.9 (*)    Platelets 420 (*)    All other components within normal limits  COMPREHENSIVE METABOLIC PANEL - Abnormal; Notable for the following components:   Sodium 127 (*)    CO2 21 (*)    Glucose, Bld 448 (*)    BUN 22 (*)    Creatinine, Ser 1.45 (*)    Calcium 8.6 (*)    Albumin 3.0 (*)    GFR calc non Af Amer 58 (*)    All other components within normal limits  BLOOD GAS, VENOUS - Abnormal; Notable for the following components:   Acid-base deficit 4.0 (*)    All other components within normal limits  GLUCOSE, CAPILLARY - Abnormal; Notable for the following components:   Glucose-Capillary 266 (*)    All other components within normal limits  RESPIRATORY PANEL BY RT PCR (FLU A&B, COVID)  GASTROINTESTINAL PANEL BY PCR,  STOOL (REPLACES STOOL CULTURE)  C DIFFICILE QUICK SCREEN W PCR REFLEX  LIPASE, BLOOD  LACTIC ACID, PLASMA  LACTIC ACID, PLASMA  HIV ANTIBODY (ROUTINE TESTING W REFLEX)  CBG MONITORING, ED    ____________________________________________  ________________________________________  RADIOLOGY All imaging, including plain films, CT scans, and ultrasounds, independently reviewed by me, and interpretations confirmed via formal radiology reads.  ED MD interpretation:   CT: Proctitis, no surgical abnormality  Official radiology report(s): CT ABDOMEN PELVIS WO CONTRAST  Result Date: 01/21/2020 CLINICAL DATA:  Abdominal pain with diarrhea. Recent history of C difficile infection. EXAM: CT ABDOMEN AND PELVIS WITHOUT CONTRAST TECHNIQUE: Multidetector CT imaging of the abdomen and pelvis was performed following the standard protocol without IV contrast. COMPARISON:  CT abdomen and pelvis 04/17/2019. Chest CT 12/29/2019. FINDINGS: Lower chest: Motion artifact through the lung bases with partially visualized micronodularity in the lower lobes and focal nodular opacity in the lateral aspect of the lingula, overall substantially improved from the recent prior chest CT. No pleural effusion. Hepatobiliary: Punctate calcification in the posterior right hepatic lobe. Unremarkable gallbladder. No biliary dilatation. Pancreas: Unremarkable. Spleen: Unremarkable. Adrenals/Urinary Tract: Unremarkable adrenal glands. No evidence of renal mass, calculi, or hydronephrosis. Markedly distended bladder. Stomach/Bowel: The stomach is mildly distended by ingested material. Relative lack of significant small or large bowel distension limits assessment for wall thickening. There is the suggestion of mild rectal wall thickening with mild haziness in the surrounding fat. There is no evidence of bowel obstruction. The appendix is unremarkable. Vascular/Lymphatic: Normal caliber of the abdominal aorta. No enlarged lymph nodes.  Reproductive: Unremarkable prostate. Other: No ascites or pneumoperitoneum. Musculoskeletal: Remote fractures of the left ischium and proximal left femur. Suspected old healed L5 pars defects with grade 1 anterolisthesis of L5 on S1. IMPRESSION: 1. Markedly distended bladder. 2. Mild rectal wall thickening which may reflect proctitis. 3. Improved nodularity in the lung bases since a 12/29/2019 chest CT. Electronically Signed   By: Logan Bores M.D.   On: 01/21/2020 13:38  ____________________________________________  PROCEDURES   Procedure(s) performed (including Critical Care):  Procedures  ____________________________________________  INITIAL IMPRESSION / MDM / [place] / ED COURSE  As part of my medical decision making, I reviewed the following data within the [place] notes reviewed and incorporated, Old chart reviewed, Notes from prior ED visits, and [place] Controlled Substance Database       *Jaimie Redditt was evaluated in Emergency Department on 01/21/2020 for the symptoms described in the history of present illness. He was evaluated in the context of the global COVID-19 pandemic, which necessitated consideration that the patient might be at risk for infection with the SARS-CoV-2 virus that causes COVID-19. Institutional protocols and algorithms that pertain to the evaluation of patients at risk for COVID-19 are in a state of rapid change based on information released by regulatory bodies including the CDC and federal and state organizations. These policies and algorithms were followed during the patient's care in the ED.  Some ED evaluations and interventions may be delayed as a result of limited staffing during the pandemic.*     Medical Decision Making:  44 yo M here with nausea, vomiting, weakness likely 2/2 ongoing C. Diff colitis complicated by poorly-controlled diabetes. Pt also noted to have new urinary retention which I suspect is  2/2 his poorly controlled DM with diabetic neuropathy. In and out performed, fluids started as well as insulin. Normal CO2, no signs of DKA but will need to be monitored closely. Admit to medicine.  ____________________________________________  FINAL CLINICAL IMPRESSION(S) / ED DIAGNOSES  Final diagnoses:  AKI (acute kidney injury) ([place])  C. difficile colitis  Urinary retention     MEDICATIONS GIVEN DURING THIS VISIT:  Medications  0.9 %  sodium chloride infusion (has no administration in time range)  ondansetron (ZOFRAN) injection 4 mg (has no administration in time range)  acetaminophen (TYLENOL) tablet 650 mg (has no administration in time range)  nicotine (NICODERM CQ - dosed in mg/24 hours) patch 21 mg (has no administration in time range)  insulin aspart (novoLOG) injection 0-9 Units (has no administration in time range)  ciprofloxacin (CIPRO) IVPB 400 mg (has no administration in time range)  vancomycin (VANCOCIN) 50 mg/mL oral solution 125 mg (has no administration in time range)  sodium chloride 0.9 % bolus 1,000 mL (1,000 mLs Intravenous New Bag/Given 01/21/20 1255)  sodium chloride 0.9 % bolus 1,000 mL (1,000 mLs Intravenous New Bag/Given 01/21/20 1331)  insulin aspart (novoLOG) injection 4 Units (4 Units Intravenous Given 01/21/20 1333)     ED Discharge Orders    None       Note:  This document was prepared using Dragon voice recognition software and may include unintentional dictation errors.   Duffy Bruce, MD 01/21/20 1544

## 2020-01-21 NOTE — Consult Note (Signed)
Pharmacy Antibiotic Note  Gregory Moody is a 44 y.o. male admitted on 01/21/2020 with severe diarrhea .   Patient has a history of Cdiff and is currently in AKI. CT of abdomen showed markedly distended bladder, along with mild rectal wall thickening which may reflect proctitis. For this reason, along with the severe diarrhea, Pharmacy has been consulted for Ciprofloxacin and Oral Vancomycin dosing.   Plan: 1) Ciprofloxacin 400mg  Q12 hours.   2) Oral Vancomycin 125mg  Q6 hours. Follow up with Cdiff results to determine if medication is necessary  Will continue to monitor renal function and adjust doses as deemed necessary.  Height: 5\' 7"  (170.2 cm) Weight: 46.8 kg (103 lb 2.8 oz) IBW/kg (Calculated) : 66.1  Temp (24hrs), Avg:98.3 F (36.8 C), Min:98.3 F (36.8 C), Max:98.3 F (36.8 C)  Recent Labs  Lab 01/21/20 1231  WBC 6.4  CREATININE 1.45*  LATICACIDVEN 1.2    Estimated Creatinine Clearance: 43 mL/min (A) (by C-G formula based on SCr of 1.45 mg/dL (H)).    No Known Allergies  Antimicrobials this admission: Ciprofloxacin 4/12 >>  Oral Vanc 4/12 >>   Microbiology results: 4/12 Cdiff Quick screen: pending 4/12 GI Panel: pending 4/12 Resp. Panel  Thank you for allowing pharmacy to be a part of this patient's care.  Lance Coon A [place] 01/21/2020 3:03 PM

## 2020-01-21 NOTE — ED Notes (Signed)
Spoke with Barrie Lyme from [employer] assigned to pt. Updated on pt hx and current needs by [facility].  Phone to reach her: [phone]

## 2020-01-21 NOTE — ED Notes (Signed)
In and out done on pt, per EDP order. NO complaints from pt at this time.

## 2020-01-21 NOTE — ED Notes (Signed)
Taken to floor by Elmyra Ricks, EDT. Pt clean and dry. Taken with all of belongings.

## 2020-01-21 NOTE — ED Notes (Signed)
Report to [place], Therapist, sports on 2A

## 2020-01-21 NOTE — ED Notes (Signed)
Pt transported to room 243

## 2020-01-21 NOTE — H&P (Signed)
History and Physical    Kennan Detter NTZ:[id number] DOB: [birth year] DOA: 01/21/2020  Referring MD/NP/PA:   PCP: George   Patient coming from:  The patient is coming from home.  At baseline, pt is partially dependent for most of ADL.        Chief Complaint: Diarrhea  HPI: Gregory Moody is a 44 y.o. male with medical history significant of diabetes mellitus, GERD, gastroparesis, DKA, anemia, tobacco abuse, esophageal candidiasis, COVID-19 infection 04/23/2019, recent C. difficile colitis, who presents with diarrhea.    Patient was recently hospitalized from 3/18-3/25 due to DKA, esophageal candidiasis and C. difficile colitis.  Patient was treated with fluconazole and oral vancomycin for 10 days.  In the past several days, he has been having worsening diarrhea.  He has had more than 10 times of watery diarrhea today.  Denies nausea, vomiting, abdominal pain.  No fever or chills.  He feels little dizzy.  No fall.  Has generalized weakness.  Patient does not have chest pain, cough, shortness of breath.  No symptoms of UTI or unilateral weakness.  Patient was found to have acute urinary retention with extended bladder in ED.  In/out Foley cath was performed, more than 1 L urine was removed per EDP.  ED Course: pt was found to have WBC 6.4, pending COVID-19 PCR, AKI with creatinine 1.45, BUN 22, sodium 127 (blood sugar 448, sodium is corrected to 135), blood pressure 85/65 which improved to 122/91 after giving 2 L normal saline bolus, heart rate 72, RR 14, oxygen saturation 99% on room air, temperature normal.  Patient is admitted to progressive bed as inpatient.  # CT abdomen/pelvis: 1. Markedly distended bladder. 2. Mild rectal wall thickening which may reflect proctitis. 3. Improved nodularity in the lung bases since a 12/29/2019 chest CT.  Review of Systems:   General: no fevers, chills, no body weight gain, has poor appetite, has fatigue HEENT: no  blurry vision, hearing changes or sore throat Respiratory: no dyspnea, coughing, wheezing CV: no chest pain, no palpitations GI: no nausea, vomiting, abdominal pain, has diarrhea, no constipation GU: no dysuria, burning on urination, increased urinary frequency, hematuria  Ext: no leg edema Neuro: no unilateral weakness, numbness, or tingling, no vision change or hearing loss.  Has dizziness Skin: no rash, no skin tear. MSK: No muscle spasm, no deformity, no limitation of range of movement in spin Heme: No easy bruising.  Travel history: No recent long distant travel.  Allergy: No Known Allergies  Past Medical History:  Diagnosis Date  . COVID-19   . Diabetes mellitus without complication ([place])   . Gastroparesis   . Tuberculosis     Past Surgical History:  Procedure Laterality Date  . ESOPHAGOGASTRODUODENOSCOPY N/A 02/03/2019   Procedure: ESOPHAGOGASTRODUODENOSCOPY (EGD);  Surgeon: Toledo, Benay Pike, MD;  Location: [facility] ENDOSCOPY;  Service: Gastroenterology;  Laterality: N/A;    Social History:  reports that he has been smoking. He has never used smokeless tobacco. He reports that he does not drink alcohol or use drugs.  Family History:  Family History  Problem Relation Age of Onset  . Diabetes Mother   . Diabetes Father      Prior to Admission medications   Medication Sig Start Date End Date Taking? Authorizing Provider  aspirin EC 81 MG tablet Take 81 mg by mouth daily.    [provider]  fluconazole (DIFLUCAN) 200 MG tablet Take 1 tablet (200 mg total) by mouth daily. 01/03/20   Fritzi Mandes, MD  insulin aspart (NOVOLOG) 100 UNIT/ML injection Inject 2 Units into the skin 3 (three) times daily with meals. 01/03/20   Fritzi Mandes, MD  insulin glargine (LANTUS) 100 UNIT/ML injection Inject 0.1 mLs (10 Units total) into the skin at bedtime. 01/03/20   Fritzi Mandes, MD  Multiple Vitamin (MULTIVITAMIN WITH MINERALS) TABS tablet Take 1 tablet by mouth daily. 01/03/20   Fritzi Mandes, MD  pantoprazole (PROTONIX) 40 MG tablet Take 1 tablet (40 mg total) by mouth 2 (two) times daily before a meal. 01/03/20   Fritzi Mandes, MD  sucralfate (CARAFATE) 1 g tablet Take 1 tablet (1 g total) by mouth 4 (four) times daily -  with meals and at bedtime. 01/03/20   Fritzi Mandes, MD    Physical Exam: Vitals:   01/21/20 1500 01/21/20 1530 01/21/20 1600 01/21/20 1642  BP: 121/86 (!) 130/103 95/75 112/77  Pulse: 69 69 65   Resp: 14 14 12 16   Temp:    98.3 F (36.8 C)  TempSrc:    Oral  SpO2: 100% 100% 100%   Weight:      Height:       General: Not in acute distress.  Thin body habitus, chronically ill appearing, cachectic looking HEENT:       Eyes: PERRL, EOMI, no scleral icterus.       ENT: No discharge from the ears and nose, no pharynx injection, no tonsillar enlargement.        Neck: No JVD, no bruit, no mass felt. Heme: No neck lymph node enlargement. Cardiac: S1/S2, RRR, No murmurs, No gallops or rubs. Respiratory: No rales, wheezing, rhonchi or rubs. GI: Soft, nondistended, nontender, no rebound pain, no organomegaly, BS present. GU: No hematuria Ext: No pitting leg edema bilaterally. 2+DP/PT pulse bilaterally. Musculoskeletal: No joint deformities, No joint redness or warmth, no limitation of ROM in spin. Skin: No rashes.  Neuro: Alert, oriented X3, cranial nerves II-XII grossly intact, moves all extremities normally. Psych: Patient is not psychotic, no suicidal or hemocidal ideation.  Labs on Admission: I have personally reviewed following labs and imaging studies  CBC: Recent Labs  Lab 01/21/20 1231  WBC 6.4  NEUTROABS 4.8  HGB 8.7*  HCT 26.9*  MCV 94.1  PLT 408*   Basic Metabolic Panel: Recent Labs  Lab 01/21/20 1231  NA 127*  K 4.6  CL 98  CO2 21*  GLUCOSE 448*  BUN 22*  CREATININE 1.45*  CALCIUM 8.6*   GFR: Estimated Creatinine Clearance: 43 mL/min (A) (by C-G formula based on SCr of 1.45 mg/dL (H)). Liver Function Tests: Recent Labs    Lab 01/21/20 1231  AST 21  ALT 29  ALKPHOS 108  BILITOT 0.5  PROT 7.5  ALBUMIN 3.0*   Recent Labs  Lab 01/21/20 1231  LIPASE 28   No results for input(s): AMMONIA in the last 168 hours. Coagulation Profile: No results for input(s): INR, PROTIME in the last 168 hours. Cardiac Enzymes: No results for input(s): CKTOTAL, CKMB, CKMBINDEX, TROPONINI in the last 168 hours. BNP (last 3 results) No results for input(s): PROBNP in the last 8760 hours. HbA1C: No results for input(s): HGBA1C in the last 72 hours. CBG: Recent Labs  Lab 01/21/20 1456 01/21/20 1709  GLUCAP 266* 308*   Lipid Profile: No results for input(s): CHOL, HDL, LDLCALC, TRIG, CHOLHDL, LDLDIRECT in the last 72 hours. Thyroid Function Tests: No results for input(s): TSH, T4TOTAL, FREET4, T3FREE, THYROIDAB in the last 72 hours. Anemia Panel: No results for input(s): VITAMINB12,  FOLATE, FERRITIN, TIBC, IRON, RETICCTPCT in the last 72 hours. Urine analysis:    Component Value Date/Time   COLORURINE YELLOW (A) 12/27/2019 1036   APPEARANCEUR HAZY (A) 12/27/2019 1036   LABSPEC 1.020 12/27/2019 1036   PHURINE 5.0 12/27/2019 1036   GLUCOSEU >=500 (A) 12/27/2019 1036   HGBUR NEGATIVE 12/27/2019 1036   BILIRUBINUR NEGATIVE 12/27/2019 1036   KETONESUR 5 (A) 12/27/2019 1036   PROTEINUR NEGATIVE 12/27/2019 1036   NITRITE NEGATIVE 12/27/2019 1036   LEUKOCYTESUR SMALL (A) 12/27/2019 1036   Sepsis Labs: @LABRCNTIP (procalcitonin:4,lacticidven:4) ) Recent Results (from the past 240 hour(s))  Respiratory Panel by RT PCR (Flu A&B, Covid) - Nasopharyngeal Swab     Status: None   Collection Time: 01/21/20  3:55 PM   Specimen: Nasopharyngeal Swab  Result Value Ref Range Status   SARS Coronavirus 2 by RT PCR NEGATIVE NEGATIVE Final    Comment: (NOTE) SARS-CoV-2 target nucleic acids are NOT DETECTED. The SARS-CoV-2 RNA is generally detectable in upper respiratoy specimens during the acute phase of infection. The  lowest concentration of SARS-CoV-2 viral copies this assay can detect is 131 copies/mL. A negative result does not preclude SARS-Cov-2 infection and should not be used as the sole basis for treatment or other patient management decisions. A negative result may occur with  improper specimen collection/handling, submission of specimen other than nasopharyngeal swab, presence of viral mutation(s) within the areas targeted by this assay, and inadequate number of viral copies (<131 copies/mL). A negative result must be combined with clinical observations, patient history, and epidemiological information. The expected result is Negative. Fact Sheet for Patients:  PinkCheek.be Fact Sheet for Healthcare Providers:  GravelBags.it This test is not yet ap proved or cleared by the [place] FDA and  has been authorized for detection and/or diagnosis of SARS-CoV-2 by FDA under an Emergency Use Authorization (EUA). This EUA will remain  in effect (meaning this test can be used) for the duration of the COVID-19 declaration under Section 564(b)(1) of the Act, 21 U.S.C. section 360bbb-3(b)(1), unless the authorization is terminated or revoked sooner.    Influenza A by PCR NEGATIVE NEGATIVE Final   Influenza B by PCR NEGATIVE NEGATIVE Final    Comment: (NOTE) The Xpert Xpress SARS-CoV-2/FLU/RSV assay is intended as an aid in  the diagnosis of influenza from Nasopharyngeal swab specimens and  should not be used as a sole basis for treatment. Nasal washings and  aspirates are unacceptable for Xpert Xpress SARS-CoV-2/FLU/RSV  testing. Fact Sheet for Patients: PinkCheek.be Fact Sheet for Healthcare Providers: GravelBags.it This test is not yet approved or cleared by the [place] FDA and  has been authorized for detection and/or diagnosis of SARS-CoV-2 by  FDA under an Emergency  Use Authorization (EUA). This EUA will remain  in effect (meaning this test can be used) for the duration of the  Covid-19 declaration under Section 564(b)(1) of the Act, 21  U.S.C. section 360bbb-3(b)(1), unless the authorization is  terminated or revoked. Performed at [facility], [address] [place], [place] [address]      Radiological Exams on Admission: CT ABDOMEN PELVIS WO CONTRAST  Result Date: 01/21/2020 CLINICAL DATA:  Abdominal pain with diarrhea. Recent history of C difficile infection. EXAM: CT ABDOMEN AND PELVIS WITHOUT CONTRAST TECHNIQUE: Multidetector CT imaging of the abdomen and pelvis was performed following the standard protocol without IV contrast. COMPARISON:  CT abdomen and pelvis 04/17/2019. Chest CT 12/29/2019. FINDINGS: Lower chest: Motion artifact through the lung bases with partially visualized  micronodularity in the lower lobes and focal nodular opacity in the lateral aspect of the lingula, overall substantially improved from the recent prior chest CT. No pleural effusion. Hepatobiliary: Punctate calcification in the posterior right hepatic lobe. Unremarkable gallbladder. No biliary dilatation. Pancreas: Unremarkable. Spleen: Unremarkable. Adrenals/Urinary Tract: Unremarkable adrenal glands. No evidence of renal mass, calculi, or hydronephrosis. Markedly distended bladder. Stomach/Bowel: The stomach is mildly distended by ingested material. Relative lack of significant small or large bowel distension limits assessment for wall thickening. There is the suggestion of mild rectal wall thickening with mild haziness in the surrounding fat. There is no evidence of bowel obstruction. The appendix is unremarkable. Vascular/Lymphatic: Normal caliber of the abdominal aorta. No enlarged lymph nodes. Reproductive: Unremarkable prostate. Other: No ascites or pneumoperitoneum. Musculoskeletal: Remote fractures of the left ischium and proximal left femur. Suspected old healed  L5 pars defects with grade 1 anterolisthesis of L5 on S1. IMPRESSION: 1. Markedly distended bladder. 2. Mild rectal wall thickening which may reflect proctitis. 3. Improved nodularity in the lung bases since a 12/29/2019 chest CT. Electronically Signed   By: Logan Bores M.D.   On: 01/21/2020 13:38     EKG: Independently reviewed.  Sinus rhythm, QTC 501, low voltage, poor quality of EKG strips.  Assessment/Plan Principal Problem:   Diarrhea Active Problems:   GERD (gastroesophageal reflux disease)   AKI (acute kidney injury) (HCC)   Hypotension   Anemia of chronic disease   Protein-calorie malnutrition, severe   Tobacco abuse   C. difficile colitis   Urinary retention   Diabetes mellitus without complication ([place])   Diarrhea: Likely due to C. difficile colitis.  CT abdomen/pelvis that showed possible proctitis -Admitted to progressive bed as inpatient -Start oral vancomycin -Start Cipro empirically -Check GI pathogen panel - Will not check C. difficile again since the last test was with a month. -IV fluid: 2 L normal saline, followed by 100 cc/h   Recent history of C. difficile colitis -On oral vancomycin as above  GERD (gastroesophageal reflux disease): -Protonix  AKI (acute kidney injury) ([place]): Likely due to dehydration -IV fluid as above -Follow-up renal function by BMP  Hypotension: Likely due to dehydration.  Patient does not have fever or leukocytosis, and lactic acid is normal.  Does not seem to have sepsis -IV fluid as above  Diabetes mellitus without complication: Most recent A1c 15.0, poorly controled. Patient is taking NovoLog and Lantus at home -will decrease Lantus dose from 10 units to 8 units daily -SSI  Anemia of chronic disease: Hemoglobin 8.7 down from recent 10.0.  No active bleeding -Follow-up with CBC  Protein-calorie malnutrition, severe -Start Ensure  Tobacco abuse -Nicotine patch  Urinary retention: -In and out Foley catheter was  done    Inpatient status:  # Patient requires inpatient status due to high intensity of service, high risk for further deterioration and high frequency of surveillance required.  I certify that at the point of admission it is my clinical judgment that the patient will require inpatient hospital care spanning beyond 2 midnights from the point of admission.  . This patient has multiple chronic comorbidities including diabetes mellitus, GERD, gastroparesis, DKA, anemia, tobacco abuse, esophageal candidiasis, COVID-19 infection 04/23/2019, recent C. difficile colitis. . Now patient has presenting with dirrhea, possibly due to C. difficile colitis. . The worrisome physical exam findings include thin body habitus, chronically ill appearing, cachectic looking. . The initial radiographic and laboratory data are worrisome because of AKI. CT showed rectal wall thickening which may  reflect proctitis. . Current medical needs: please see my assessment and plan . Predictability of an adverse outcome (risk): Patient has multiple comorbidities as listed above. Now presents with diarrhea possibly due to C. difficile colitis.  Patient may also have proctitis.  Patient is hypotensive on admission. Patient's presentation is highly complicated.  Patient is at high risk of deteriorating.  Will need to be treated in hospital for at least 2 days.           DVT ppx: SQ Lovenox Code Status: Full code Family Communication: not done, no family member is at bed side.    Disposition Plan:  Anticipate discharge back to previous home environment Consults called:  none Admission status:  progressive unit for as inpt  Date of Service 01/21/2020    [place] [facility]   If 7PM-7AM, please contact night-coverage [email or web address] 01/21/2020, 6:07 PM

## 2020-01-21 NOTE — ED Triage Notes (Signed)
Pt from home via EMS. SS called EMS for pt's diarrhea.  SS reported to EMS pt is dizzy, hx of CDIFF, TB, SI. Pt stated no current SI.  Pt CBG per EMS 415, BP 89/60.

## 2020-01-21 NOTE — ED Notes (Signed)
Spoke with Joycelyn Man, Adult protective services. Updated her on pt admission status and room change.

## 2020-01-22 DIAGNOSIS — E43 Unspecified severe protein-calorie malnutrition: Secondary | ICD-10-CM

## 2020-01-22 DIAGNOSIS — K219 Gastro-esophageal reflux disease without esophagitis: Secondary | ICD-10-CM

## 2020-01-22 DIAGNOSIS — N179 Acute kidney failure, unspecified: Secondary | ICD-10-CM

## 2020-01-22 DIAGNOSIS — I959 Hypotension, unspecified: Secondary | ICD-10-CM

## 2020-01-22 DIAGNOSIS — E119 Type 2 diabetes mellitus without complications: Secondary | ICD-10-CM

## 2020-01-22 LAB — GASTROINTESTINAL PANEL BY PCR, STOOL (REPLACES STOOL CULTURE)

## 2020-01-22 LAB — CBC
HCT: 27 % — ABNORMAL LOW (ref 39.0–52.0)
Hemoglobin: 8.8 g/dL — ABNORMAL LOW (ref 13.0–17.0)
MCH: 30.3 pg (ref 26.0–34.0)
MCHC: 32.6 g/dL (ref 30.0–36.0)
MCV: 93.1 fL (ref 80.0–100.0)
Platelets: 393 10*3/uL (ref 150–400)
RBC: 2.9 MIL/uL — ABNORMAL LOW (ref 4.22–5.81)
RDW: 13.4 % (ref 11.5–15.5)
WBC: 5.7 10*3/uL (ref 4.0–10.5)
nRBC: 0 % (ref 0.0–0.2)

## 2020-01-22 LAB — C DIFFICILE QUICK SCREEN W PCR REFLEX
C Diff antigen: NEGATIVE
C Diff interpretation: NOT DETECTED
C Diff toxin: NEGATIVE

## 2020-01-22 LAB — BASIC METABOLIC PANEL
Anion gap: 6 (ref 5–15)
BUN: 20 mg/dL (ref 6–20)
CO2: 21 mmol/L — ABNORMAL LOW (ref 22–32)
Calcium: 8.8 mg/dL — ABNORMAL LOW (ref 8.9–10.3)
Chloride: 104 mmol/L (ref 98–111)
Creatinine, Ser: 0.99 mg/dL (ref 0.61–1.24)
GFR calc Af Amer: >60 mL/min (ref >60)
GFR calc non Af Amer: >60 mL/min (ref >60)
Glucose, Bld: 201 mg/dL — ABNORMAL HIGH (ref 70–99)
Potassium: 4.4 mmol/L (ref 3.5–5.1)
Sodium: 131 mmol/L — ABNORMAL LOW (ref 135–145)

## 2020-01-22 LAB — GLUCOSE, CAPILLARY
Glucose-Capillary: 180 mg/dL — ABNORMAL HIGH (ref 70–99)
Glucose-Capillary: 191 mg/dL — ABNORMAL HIGH (ref 70–99)
Glucose-Capillary: 192 mg/dL — ABNORMAL HIGH (ref 70–99)
Glucose-Capillary: 242 mg/dL — ABNORMAL HIGH (ref 70–99)
Glucose-Capillary: 307 mg/dL — ABNORMAL HIGH (ref 70–99)
Glucose-Capillary: 328 mg/dL — ABNORMAL HIGH (ref 70–99)

## 2020-01-22 LAB — SEDIMENTATION RATE: Sed Rate: 109 mm/hr — ABNORMAL HIGH (ref 0–15)

## 2020-01-22 LAB — C-REACTIVE PROTEIN: CRP: 1.9 mg/dL — ABNORMAL HIGH (ref <1.0)

## 2020-01-22 MED ORDER — RISAQUAD PO CAPS
1.0000 | ORAL_CAPSULE | Freq: Three times a day (TID) | ORAL | Status: DC
  Administered 2020-01-22 – 2020-01-30 (×24): 1 via ORAL
  Filled 2020-01-22 (×24): qty 1

## 2020-01-22 MED ORDER — LIVING WELL WITH DIABETES BOOK - IN SPANISH
Freq: Once | Status: AC
  Filled 2020-01-22 (×2): qty 1

## 2020-01-22 MED ORDER — METRONIDAZOLE 500 MG PO TABS: 500.0000 mg | ORAL_TABLET | Freq: Three times a day (TID) | ORAL | Status: DC

## 2020-01-22 MED ORDER — INSULIN GLARGINE 100 UNIT/ML ~~LOC~~ SOLN
10.0000 [IU] | Freq: Every day | SUBCUTANEOUS | Status: DC
  Administered 2020-01-22 – 2020-01-29 (×8): 10 [IU] via SUBCUTANEOUS
  Filled 2020-01-22 (×9): qty 0.1

## 2020-01-22 NOTE — Progress Notes (Signed)
Inpatient Diabetes Program Recommendations  AACE/ADA: New Consensus Statement on Inpatient Glycemic Control   Target Ranges:  Prepandial:   less than 140 mg/dL      Peak postprandial:   less than 180 mg/dL (1-2 hours)      Critically ill patients:  140 - 180 mg/dL   Results for Gregory Moody, Gregory Moody (MRN [id number]) as of 01/22/2020 11:45  Ref. Range 01/21/2020 14:56 01/21/2020 17:09 01/21/2020 18:48 01/21/2020 20:47 01/22/2020 00:00 01/22/2020 04:27 01/22/2020 08:01  Glucose-Capillary Latest Ref Range: 70 - 99 mg/dL 266 (H) 308 (H) 223 (H) 345 (H) 328 (H) 180 (H) 191 (H)  Results for Gregory Moody, Gregory Moody (MRN [id number]) as of 01/22/2020 11:45  Ref. Range 01/21/2020 12:31  Glucose Latest Ref Range: 70 - 99 mg/dL 448 (H)  Results for Gregory Moody, Gregory Moody (MRN [id number]) as of 01/22/2020 11:45  Ref. Range 03/30/2019 19:14 12/27/2019 15:59 12/29/2019 00:12  Hemoglobin A1C Latest Ref Range: 4.8 - 5.6 % 8.1 (H) >15.5 (H) 15.0 (H)   Review of Glycemic Control  Diabetes history: DM2 Outpatient Diabetes medications: Lantus 10 units QHS, Novolog 2 units TID with meals Current orders for Inpatient glycemic control: Lantus 8 units QHS, Novolog 0-9 units Q4H  Inpatient Diabetes Program Recommendations:   Insulin-Basal: Please consider increasing Lantus to 10 units QHS.  Insulin-Meal Coverage: Please consider ordering Novolog 3 units TID with meals for meal coverage if patient eats at least 50% of meals.  HgbA1C: A1C >15.5% on 12/27/19 indicating an average glucose over 398 mg/dl over the past 2-3 months.  NOTE: Spoke with patient over the phone about diabetes and home regimen for diabetes control. Patient states he is able to speak and understand some English but he would prefer to use a Optometrist.  Therefore, called [employer] to translate [phone]).  Patient confirms that he is uninsured and he states that he goes to [facility] and gets insulin from the clinic.  Patient states that  it is difficult for him to pay to see provider and get insulin sometimes because if he is not able to work he does not have any money to pay for things he needs.  Patient reports he is taking Lantus 10 units QHS and Novolog 2 units TID with meals for DM control. Patient reports that he checks glucose at home twice a day and it is usually in 200-300's mg/dl.  Patient notes that he is running low on lancets. Encouraged patient to go to [facility] and purchase more lancets ($1.56 for box of 100 lancets).  Discussed A1C results (>15.5% on 12/27/19) and explained that current A1C indicates an average glucose over 398 mg/dl. Discussed glucose and A1C goals. Discussed importance of checking CBGs and maintaining good CBG control to prevent long-term and short-term complications. Explained how hyperglycemia leads to damage within blood vessels which lead to the common complications seen with uncontrolled diabetes. Patient states that he gets symptoms of hyperglycemia often and notes that his vision seems to be worse lately due to blurry vision.  Discussed how hyperglycemia can cause lens of eye to swell and cause blurry vision. Discussed that if DM continues to be poorly controlled, it could lead to blindness.  Stressed to the patient the importance of improving glycemic control to prevent further complications from uncontrolled diabetes. Discussed impact of nutrition, exercise, stress, sickness, and medications on diabetes control. Patient reports that he eats what his mother cooks; he notes that she has DM as well. Patient does not know what Carbohydrates are and not sure what  he needs to be eating to following Carb Modified diet. Discussed Carb Modified diet, portion sizes, and foods with carbohydrates.  Ordered RD consult for further diet education (please use an interpretor).  Encouraged patient to follow Carb Modified diet, take DM medications as prescribed, and check glucose 3-4 times per day. Patient again reports that  sometimes he can not afford to go see a doctor or get medications when he is not able to work. Patient inquiring about any resources in the community that could perhaps offer free healthcare and medications.  Informed patient that TOC was consulted and it would be noted that he would like information about resources in the community that could help with follow and medications. Patient asked that when [facility] talks with him that they use a Spanish interpreter to be sure he fully understands what resources are available to him.  Patient states that he can read Spanish. Informed patient that a Living Well with DM book will be ordered; encouraged patient to read book to learn more about DM and how to keep it better controlled.   Patient verbalized understanding of information discussed and reports no further questions at this time related to diabetes.  Thanks, Barnie Alderman, RN, MSN, CDE Diabetes Coordinator Inpatient Diabetes Program [phone] (Team Pager)

## 2020-01-22 NOTE — Progress Notes (Signed)
PT Cancellation Note  Patient Details Name: Maximilliano Kersh MRN: [id number] DOB: [birth year]   Cancelled Treatment:    Reason Eval/Treat Not Completed: Other (comment) Attempted to see pt X 2 this AM, both times he was on the [facility] with diarrhea.  Will try back as pt is appropriate and time allows.    Kreg Shropshire, DPT 01/22/2020, 11:59 AM

## 2020-01-22 NOTE — Progress Notes (Addendum)
Pt repeatedly calling overnight for snacks and juice. Pt had large dinner tray and evening snacks. Education provided and reinforced multiple times by multiple nurses. Pt BG in 300s overnight and last admission was for DKA. Current A1C 15. Will continue to reinforce education.

## 2020-01-22 NOTE — Progress Notes (Signed)
PT Cancellation Note  Patient Details Name: Gregory Moody MRN: [id number] DOB: 02-08-1976   Cancelled Treatment:    Reason Eval/Treat Not Completed: Patient declined, no reason specified  Attempted to see pt yet again today, he was laying in bed in mild distress.  He reports feeling too weak and tired to try PT today, did say that he would try tomorrow.     Kreg Shropshire. DPT 01/22/2020, 4:40 PM

## 2020-01-22 NOTE — Progress Notes (Signed)
PROGRESS NOTE    Patient: Gregory Moody                            PCP: [place]                    DOB: 1976/02/08            DOA: 01/21/2020 [id number]             DOS: 01/22/2020, 3:54 PM   LOS: 1 day   Date of Service: The patient was seen and examined on 01/22/2020  Subjective:   The patient was seen and examined this morning, remained stable.  He states that he has been having diarrhea for past 3 years.... Progressively worse now  States symptoms started after he was treated for TB, 3 years ago. He denies of having any fever, chills, nausea or vomiting.    Brief Narrative:   Gregory Moody is a 44 y.o. male with medical history significant of diabetes mellitus, GERD, gastroparesis, DKA, anemia, tobacco abuse, esophageal candidiasis, remote history of TB 3 years ago, COVID-19 infection 04/23/2019, recent C. difficile colitis, who presents with diarrhea.   Repeated stool negative for C. difficile toxin antigen, gastric panel negative also. Is very cachectic, with generalized weaknesses  Recently hospitalized from 3/18-3/25 due to DKA, esophageal candidiasis and C. difficile colitis.  Patient was treated with fluconazole and oral vancomycin for 10 days.   On admission he was hypotensive with a blood pressure of 85/65 improved with 2 L NS IV fluid to 122/91,  Assessment & Plan:   Principal Problem:   Diarrhea Active Problems:   GERD (gastroesophageal reflux disease)   AKI (acute kidney injury) (HCC)   Hypotension   Anemia of chronic disease   Protein-calorie malnutrition, severe   Tobacco abuse   C. difficile colitis   Urinary retention   Diabetes mellitus without complication (HCC)   Diarrhea: -Monitoring closely -nonbilious nonbloody Recently hospitalized from 3/18-3/25 due to DKA, esophageal candidiasis and C. difficile colitis. Patient was treated with fluconazole and oral vancomycin for 10 days.    -Repeat stool study for  C. difficile was negative toxin was negative,  -GI pathogen were all negative -Patient was initiated on admission on p.o. vancomycin and ciprofloxacin both were DC'd -Patient does not have any fever,chills, negative sign of sepsis exception of hypertension -All antibiotics will be DC'd for now -We will monitor closely -Lactobacillus added to the regimen -We will check for Hepatitis panel, HIV screening, CRP,  ESR  -IV fluid: 2 L normal saline, followed by 100 cc/h   Recent history of C. difficile colitis --Repeat stool studies with toxins antigen negative -Withholding p.o. vancomycin  GERD (gastroesophageal reflux disease): -Protonix --will be DC'd as it would increase his chance of recurrent C. difficile  AKI (acute kidney injury) ([place]):  Likely due to dehydration -IV fluid as above -Follow-up renal function by BMP  Hypotension:  Likely due to dehydration.  Patient does NOT have fever or leukocytosis, and lactic acid is normal.  Does not seem to have sepsis -IV fluid as above  Diabetes mellitus without complication:  Most recent A1c 15.0, poorly controled.  Patient is taking NovoLog and Lantus at home -10 units nightly -Diabetic coordinator consulted,  -CBG QA CHS, SSI   Anemia of chronic disease:  Hemoglobin 8.7 down from recent 10.0.   No active bleeding -Follow-up with CBC  Protein-calorie malnutrition, severe -Start Ensure  Tobacco abuse -Nicotine patch  Urinary retention: -In and out Foley catheter was done   Nutritional status:         Cultures; Stool cultures Antimicrobials: Antibiotics for 01/21/2020 p.o. vancomycin, ciprofloxacin till 01/22/2020 --------------------------------------------------------------------------------------------------------------------------------  DVT prophylaxis: Lovenox SQ Code Status:   Code Status: Full Code Family Communication: No family member present at bedside- attempt will be made to update daily The  above findings and plan of care has been discussed with patient (and family )  in detail,  they expressed understanding and agreement of above. -Advance care planning has been discussed.   Admission status:   Status is: Inpatient  Remains inpatient appropriate because:Hemodynamically unstable   Dispo: The patient is from: Home              Anticipated d/c is to: Home              Anticipated d/c date is: 2 days              Patient currently is not medically stable to d/c.    Consultants: None  Procedures:   No admission procedures for hospital encounter.     Antimicrobials:  Anti-infectives (From admission, onward)   Start     Dose/Rate Route Frequency Ordered Stop   01/22/20 1545  metroNIDAZOLE (FLAGYL) tablet 500 mg  Status:  Discontinued     500 mg Oral Every 8 hours 01/22/20 1539 01/22/20 1540   01/21/20 1800  vancomycin (VANCOCIN) 50 mg/mL oral solution 125 mg  Status:  Discontinued     125 mg Oral Every 6 hours 01/21/20 1500 01/22/20 1538   01/21/20 1515  ciprofloxacin (CIPRO) IVPB 400 mg  Status:  Discontinued     400 mg 200 mL/hr over 60 Minutes Intravenous Every 12 hours 01/21/20 1500 01/22/20 1538       Medication:  . acidophilus  1 capsule Oral TID  . aspirin EC  81 mg Oral Daily  . enoxaparin (LOVENOX) injection  40 mg Subcutaneous Q24H  . feeding supplement (ENSURE ENLIVE)  237 mL Oral BID BM  . FLUoxetine  10 mg Oral Daily  . insulin aspart  0-9 Units Subcutaneous Q4H  . insulin glargine  10 Units Subcutaneous QHS  . multivitamin with minerals  1 tablet Oral Daily  . nicotine  21 mg Transdermal Daily  . sucralfate  1 g Oral TID WC & HS    acetaminophen, ondansetron (ZOFRAN) IV   Objective:   Vitals:   01/21/20 1825 01/22/20 0430 01/22/20 0759 01/22/20 1216  BP: 100/72 113/76 123/84 102/77  Pulse: 62 61 62 70  Resp: '16 18 18 18  ' Temp: (!) 97.5 F (36.4 C)  (!) 97.5 F (36.4 C)   TempSrc: Oral     SpO2: 100% 100% 100% 100%  Weight:   49.2 kg    Height:        Intake/Output Summary (Last 24 hours) at 01/22/2020 1554 Last data filed at 01/22/2020 0600 Gross per 24 hour  Intake 1668.94 ml  Output --  Net 1668.94 ml   Filed Weights   01/21/20 1215 01/22/20 0430  Weight: 46.8 kg 49.2 kg     Examination:   Physical Exam  Constitution: Cachectic, alert, cooperative, no distress,  Appears calm and comfortable  Psychiatric: Normal and stable mood and affect, cognition intact,   HEENT: Normocephalic, PERRL, otherwise with in Normal limits  Chest:Chest symmetric Cardio vascular:  S1/S2, RRR, No murmure, No Rubs or Gallops  pulmonary: Clear to auscultation  bilaterally, respirations unlabored, negative wheezes / crackles Abdomen: Soft, non-tender, non-distended, bowel sounds,no masses, no organomegaly Muscular skeletal: Limited exam - in bed, able to move all 4 extremities, Normal strength,  Neuro: CNII-XII intact. , normal motor and sensation, reflexes intact  Extremities: No pitting edema lower extremities, +2 pulses  Skin: Dry, warm to touch, negative for any Rashes, No open wounds Wounds: per nursing documentation Pressure Injury 12/28/19 Elbow Left Stage 2 -  Partial thickness loss of dermis presenting as a shallow open injury with a red, pink wound bed without slough. (Active)  12/28/19 1422  Location: Elbow  Location Orientation: Left  Staging: Stage 2 -  Partial thickness loss of dermis presenting as a shallow open injury with a red, pink wound bed without slough.  Wound Description (Comments):   Present on Admission: Yes (Present on transfer to 1A)     Pressure Injury 12/28/19 Elbow Right Stage 3 -  Full thickness tissue loss. Subcutaneous fat may be visible but bone, tendon or muscle are NOT exposed. (Active)  12/28/19 1422  Location: Elbow  Location Orientation: Right  Staging: Stage 3 -  Full thickness tissue loss. Subcutaneous fat may be visible but bone, tendon or muscle are NOT exposed.  Wound  Description (Comments):   Present on Admission: Yes (Present on transfer to 1A)               LABs:  CBC Latest Ref Rng & Units 01/22/2020 01/21/2020 01/01/2020  WBC 4.0 - 10.5 K/uL 5.7 6.4 5.6  Hemoglobin 13.0 - 17.0 g/dL 8.8(L) 8.7(L) 10.0(L)  Hematocrit 39.0 - 52.0 % 27.0(L) 26.9(L) 29.0(L)  Platelets 150 - 400 K/uL 393 420(H) 164   CMP Latest Ref Rng & Units 01/22/2020 01/21/2020 01/01/2020  Glucose 70 - 99 mg/dL 201(H) 448(H) 122(H)  BUN 6 - 20 mg/dL 20 22(H) 10  Creatinine 0.61 - 1.24 mg/dL 0.99 1.45(H) 0.84  Sodium 135 - 145 mmol/L 131(L) 127(L) 131(L)  Potassium 3.5 - 5.1 mmol/L 4.4 4.6 3.4(L)  Chloride 98 - 111 mmol/L 104 98 101  CO2 22 - 32 mmol/L 21(L) 21(L) 23  Calcium 8.9 - 10.3 mg/dL 8.8(L) 8.6(L) 7.4(L)  Total Protein 6.5 - 8.1 g/dL - 7.5 5.1(L)  Total Bilirubin 0.3 - 1.2 mg/dL - 0.5 0.9  Alkaline Phos 38 - 126 U/L - 108 59  AST 15 - 41 U/L - 21 9(L)  ALT 0 - 44 U/L - 29 6        SIGNED: Deatra James, MD, FACP, FHM. [facility],  Pager [phone] ([email or web address] to [email or web address])  If 7PM-7AM, please contact night-coverage [email or web address] Ota [facility] 01/22/2020, 3:54 PM

## 2020-01-22 NOTE — Progress Notes (Signed)
OT Cancellation Note  Patient Details Name: Gregory Moody MRN: [id number] DOB: 05-07-1976   Cancelled Treatment:    Reason Eval/Treat Not Completed: Patient declined, no reason specified;Other (comment). Thank you for the OT consult. Order received and chart reviewed. Pt cleared by primary RN for OT eval. Upon arrival to room, pt noted to be semi-supine in bed. Pt pleasantly declines therapy at this time, stating he has been on and off his BSC all morning and is fatigued/still having symptoms of diarrhea. Will hold OT evaluation at this time and re-attempt at a later time/date as available and pt medically appropriate for OT treatment/eval.   Shara Blazing, M.S., OTR/L Ascom: [phone] 01/22/20, 11:30 AM

## 2020-01-23 LAB — BASIC METABOLIC PANEL
Anion gap: 5 (ref 5–15)
BUN: 16 mg/dL (ref 6–20)
CO2: 17 mmol/L — ABNORMAL LOW (ref 22–32)
Calcium: 8.2 mg/dL — ABNORMAL LOW (ref 8.9–10.3)
Chloride: 113 mmol/L — ABNORMAL HIGH (ref 98–111)
Creatinine, Ser: 0.98 mg/dL (ref 0.61–1.24)
GFR calc Af Amer: >60 mL/min (ref >60)
GFR calc non Af Amer: >60 mL/min (ref >60)
Glucose, Bld: 101 mg/dL — ABNORMAL HIGH (ref 70–99)
Potassium: 3.9 mmol/L (ref 3.5–5.1)
Sodium: 135 mmol/L (ref 135–145)

## 2020-01-23 LAB — GLUCOSE, CAPILLARY
Glucose-Capillary: 101 mg/dL — ABNORMAL HIGH (ref 70–99)
Glucose-Capillary: 106 mg/dL — ABNORMAL HIGH (ref 70–99)
Glucose-Capillary: 152 mg/dL — ABNORMAL HIGH (ref 70–99)
Glucose-Capillary: 161 mg/dL — ABNORMAL HIGH (ref 70–99)
Glucose-Capillary: 220 mg/dL — ABNORMAL HIGH (ref 70–99)
Glucose-Capillary: 274 mg/dL — ABNORMAL HIGH (ref 70–99)
Glucose-Capillary: 319 mg/dL — ABNORMAL HIGH (ref 70–99)

## 2020-01-23 LAB — CBC
HCT: 23 % — ABNORMAL LOW (ref 39.0–52.0)
Hemoglobin: 7.6 g/dL — ABNORMAL LOW (ref 13.0–17.0)
MCH: 30.8 pg (ref 26.0–34.0)
MCHC: 33 g/dL (ref 30.0–36.0)
MCV: 93.1 fL (ref 80.0–100.0)
Platelets: 374 10*3/uL (ref 150–400)
RBC: 2.47 MIL/uL — ABNORMAL LOW (ref 4.22–5.81)
RDW: 13.2 % (ref 11.5–15.5)
WBC: 5.4 10*3/uL (ref 4.0–10.5)
nRBC: 0 % (ref 0.0–0.2)

## 2020-01-23 LAB — HEPATITIS PANEL, ACUTE
HCV Ab: NONREACTIVE
Hep A IgM: NONREACTIVE
Hep B C IgM: NONREACTIVE
Hepatitis B Surface Ag: NONREACTIVE

## 2020-01-23 LAB — MAGNESIUM: Magnesium: 1.8 mg/dL (ref 1.7–2.4)

## 2020-01-23 LAB — HIV ANTIBODY (ROUTINE TESTING W REFLEX): HIV Screen 4th Generation wRfx: NONREACTIVE

## 2020-01-23 MED ORDER — LOPERAMIDE HCL 2 MG PO CAPS: 2.0000 mg | ORAL_CAPSULE | ORAL | Status: DC | PRN

## 2020-01-23 MED ORDER — SODIUM CHLORIDE 0.9 % IV SOLN
500.0000 mL | Freq: Once | INTRAVENOUS | Status: AC
  Administered 2020-01-23: 05:00:00 500 mL via INTRAVENOUS

## 2020-01-23 MED ORDER — ALBUMIN HUMAN 25 % IV SOLN
25.0000 g | Freq: Once | INTRAVENOUS | Status: AC
  Administered 2020-01-23: 25 g via INTRAVENOUS
  Filled 2020-01-23: qty 100

## 2020-01-23 MED ORDER — ENSURE MAX PROTEIN PO LIQD
11.0000 [oz_av] | Freq: Two times a day (BID) | ORAL | Status: DC
  Administered 2020-01-23 – 2020-01-29 (×9): 11 [oz_av] via ORAL
  Filled 2020-01-23: qty 330

## 2020-01-23 MED ORDER — SODIUM CHLORIDE 0.9 % IV SOLN: INTRAVENOUS | Status: DC

## 2020-01-23 NOTE — Consult Note (Signed)
GI Inpatient Consult Note  Reason for Consult: Intractable diarrhea, proctitis    Attending Requesting Consult: Dr. Nolberto Hanlon, MD  History of Present Illness: Gregory Moody is a 44 y.o. male seen for evaluation of colitis at the request of Dr. Kurtis Bushman. Pt has a PMH of GERD, gastroparesis, Hx of DKA, tobacco abuse, COVID-19 infection 04/2019, chronic anemia who presented to the ED with chief complaint of diarrhea. Pt was recently hospitalized 03/18 - 03/25 due to DKA, esophageal candidiasis, and C difficile colitis. He was treated with oral fluconazole and vancomycin for 10 days. He was empirically started on Cipro and Vancomycin upon admission, but these were discontinued after repeat C diff testing was negative for antigen and toxin. GI panel was negative for acute pathogens. Blood cultures x 2 negative. Hemoglobin on admission was 8.7 and has decreased to 7.6 this morning with no overt bleeding. CT abd/pelvis wo contrast showed mild rectal wall thickening suggestive of proctitis. Blood inflammatory markers elevated - ESR at 109 and CRP at 1.9. Pt reports he has been dealing with chronic diarrhea ever since receive treatment of TB with [facility] about three years ago. Reportedly had colonoscopy 02/2017 while hospitalized at [facility] where colonoscopy showed mild acute colitis around IC valve with no features of chronicity. Pt reports he continues to have diarrhea that is severe. He is going anywhere from 6-15 times a day with very watery stools. He endorses some fecal urgency without any tenesmus, hematochezia, or melena. He does endorse nocturnal diarrhea where he is awoken up from sleep with the diarrhea. He also endorses nausea without emesis. His diabetes is not well controlled at home - Hgb A1c is as high as 15%. There were concerns over his living situation at his previous admission in March. He was seen as consult by Dr. Alice Reichert during previous hospital stay for anemia which was thought 2/2 nutritional  deficiencies and failure to thrive.     Last Colonoscopy: 02/2017 Findings:   The colon appeared normal.   A localized area of mildly erythematous mucosa was found at the    ileocecal valve. This was biopsied with a cold forceps for histology.    Estimated blood loss was minimal.   The terminal ileum appeared normal.  Path: Colon, terminal ileum, biopsy  - Minimal acute neutrophilic cryptitis, consistent with acute self-limited colitis - Features of chronicity not identified - No evidence for involvement of mucosa by TB   Last Endoscopy:  EGD 01/2019 - LA Grade C reflux esophagitis. - 1 cm hiatal hernia. - Normal examined duodenum. - The examination was otherwise normal. - No specimens collected.   Past Medical History:  Past Medical History:  Diagnosis Date  . COVID-19   . Diabetes mellitus without complication ([place])   . Gastroparesis   . Tuberculosis     Problem List: Patient Active Problem List   Diagnosis Date Noted  . Diarrhea 01/21/2020  . Tobacco abuse 01/21/2020  . C. difficile colitis 01/21/2020  . Urinary retention 01/21/2020  . Diabetes mellitus without complication ([place]) 35/45/6256  . Protein-calorie malnutrition, severe 01/02/2020  . Odynophagia   . DKA (diabetic ketoacidoses) ([place]) 12/27/2019  . Depression 12/27/2019  . DKA, type 2 ([place]) 12/27/2019  . Anemia of chronic disease 04/25/2019  . Hypomagnesemia 04/25/2019  . Hypotension 04/23/2019  . CAP (community acquired pneumonia) due to MSSA (methicillin sensitive Staphylococcus aureus) ([place]) 04/14/2019  . COVID-19 virus infection 04/14/2019  . Pressure injury of skin 03/31/2019  . Acute renal failure (ARF) ([place]) 03/06/2019  .  CAP (community acquired pneumonia) 02/14/2019  . AKI (acute kidney injury) ([place]) 02/02/2019  . ARF (acute renal failure) ([employer]) 01/03/2019  . Malnutrition of moderate degree 02/24/2018  . GERD (gastroesophageal reflux disease) 02/23/2018  .  Diabetic gastroparesis ([place]) 02/23/2018  . Diabetic foot ulcer ([place]) 02/23/2018  . Aspiration pneumonia ([place]) 04/21/2017  . Hypoglycemia 04/21/2017  . Diabetes mellitus with hyperglycemia ([place]) 04/21/2017  . Hip fracture, unspecified laterality, closed, initial encounter ([place]) 04/17/2017  . Hyperglycemia 04/17/2017  . Malnutrition ([place]) 04/09/2016  . Cavitary lesion of lung 04/06/2016    Past Surgical History: Past Surgical History:  Procedure Laterality Date  . ESOPHAGOGASTRODUODENOSCOPY N/A 02/03/2019   Procedure: ESOPHAGOGASTRODUODENOSCOPY (EGD);  Surgeon: Toledo, Benay Pike, MD;  Location: [facility] ENDOSCOPY;  Service: Gastroenterology;  Laterality: N/A;    Allergies: No Known Allergies  Home Medications: Medications Prior to Admission  Medication Sig Dispense Refill Last Dose  . aspirin EC 81 MG tablet Take 81 mg by mouth daily.     . fluconazole (DIFLUCAN) 200 MG tablet Take 1 tablet (200 mg total) by mouth daily. 9 tablet 0 Past Week at Unknown time  . FLUoxetine (PROZAC) 10 MG capsule Take 10 mg by mouth daily.   Past Week at Unknown time  . insulin aspart (NOVOLOG) 100 UNIT/ML injection Inject 2 Units into the skin 3 (three) times daily with meals. 10 mL 11 01/20/2020 at Unknown time  . insulin glargine (LANTUS) 100 UNIT/ML injection Inject 0.1 mLs (10 Units total) into the skin at bedtime. 10 mL 11 01/20/2020 at Unknown time  . loperamide (IMODIUM) 2 MG capsule Take 2 mg by mouth as needed for diarrhea or loose stools.   Past Week at PRN  . Multiple Vitamin (MULTIVITAMIN WITH MINERALS) TABS tablet Take 1 tablet by mouth daily. 30 tablet 0   . pantoprazole (PROTONIX) 40 MG tablet Take 1 tablet (40 mg total) by mouth 2 (two) times daily before a meal. 60 tablet 2 Past Week at Unknown time  . sucralfate (CARAFATE) 1 g tablet Take 1 tablet (1 g total) by mouth 4 (four) times daily -  with meals and at bedtime. 30 tablet 1 Past Week at Unknown time  . vancomycin (VANCOCIN) 125 MG capsule  Take 125 mg by mouth 4 (four) times daily.   Past Week at Unknown time   Home medication reconciliation was completed with the patient.   Scheduled Inpatient Medications:   . acidophilus  1 capsule Oral TID  . aspirin EC  81 mg Oral Daily  . enoxaparin (LOVENOX) injection  40 mg Subcutaneous Q24H  . FLUoxetine  10 mg Oral Daily  . insulin aspart  0-9 Units Subcutaneous Q4H  . insulin glargine  10 Units Subcutaneous QHS  . multivitamin with minerals  1 tablet Oral Daily  . nicotine  21 mg Transdermal Daily  . Ensure Max Protein  11 oz Oral BID BM  . sucralfate  1 g Oral TID WC & HS    Continuous Inpatient Infusions:   . sodium chloride 100 mL/hr at 01/23/20 1018  . sodium chloride      PRN Inpatient Medications:  acetaminophen, ondansetron (ZOFRAN) IV  Family History: family history includes Diabetes in his father and mother.  The patient's family history is negative for inflammatory bowel disorders, GI malignancy, or solid organ transplantation.  Social History:   reports that he has been smoking. He has never used smokeless tobacco. He reports that he does not drink alcohol or use drugs. The patient  denies ETOH, tobacco, or drug use.   Review of Systems: Constitutional: Weight is stable.  Eyes: No changes in vision. ENT: No oral lesions, sore throat.  GI: see HPI.  Heme/Lymph: No easy bruising.  CV: No chest pain.  GU: No hematuria.  Integumentary: No rashes.  Neuro: No headaches.  Psych: No depression/anxiety.  Endocrine: No heat/cold intolerance.  Allergic/Immunologic: No urticaria.  Resp: No cough, SOB.  Musculoskeletal: No joint swelling.    Physical Examination: BP 126/88 (BP Location: Left Arm)   Pulse 76   Temp 97.7 F (36.5 C) (Oral)   Resp 14   Ht 5' 7" (1.702 m)   Wt 47.6 kg   SpO2 100%   BMI 16.44 kg/m  Gen: NAD, alert and oriented x 4 HEENT: PEERLA, EOMI, Neck: supple, no JVD or thyromegaly Chest: CTA bilaterally, no wheezes, crackles, or  other adventitious sounds CV: RRR, no m/g/c/r Abd: soft, NT, ND, +BS in all four quadrants; no HSM, guarding, ridigity, or rebound tenderness Ext: no edema, well perfused with 2+ pulses, Skin: no rash or lesions noted Lymph: no LAD  Data: Lab Results  Component Value Date   WBC 5.4 01/23/2020   HGB 7.6 (L) 01/23/2020   HCT 23.0 (L) 01/23/2020   MCV 93.1 01/23/2020   PLT 374 01/23/2020   Recent Labs  Lab 01/21/20 1231 01/22/20 0433 01/23/20 0530  HGB 8.7* 8.8* 7.6*   Lab Results  Component Value Date   NA 135 01/23/2020   K 3.9 01/23/2020   CL 113 (H) 01/23/2020   CO2 17 (L) 01/23/2020   BUN 16 01/23/2020   CREATININE 0.98 01/23/2020   Lab Results  Component Value Date   ALT 29 01/21/2020   AST 21 01/21/2020   ALKPHOS 108 01/21/2020   BILITOT 0.5 01/21/2020   No results for input(s): APTT, INR, PTT in the last 168 hours.   CT abd/pelvis wo contrast 01/21/2020: IMPRESSION: 1. Markedly distended bladder. 2. Mild rectal wall thickening which may reflect proctitis. 3. Improved nodularity in the lung bases since a 12/29/2019 chest CT.  Assessment/Plan:  44 y/o [name] with a PMH of GERD, uncontrolled T2DM with gastroparesis, Hx of DKA, tobacco abuse, COVID-19 infection 04/2019, nutritional anemia, and failure to thrive admitted for intractable diarrhea  1. Acute on chronic diarrhea 2. Hx of c diff colitis - treated with 10-day course of vancomycin last month. Repeat C diff testing on 4/12 was negative.  3. Nutritional anemia - no signs of overt GI blood loss 4. Abnormal CT of rectum - proctitis  -DDx of acute on chronic diarrhea include microscopic colitis, IBD, malignancy, carcinoid/NET, autonomic dysfunction, exocrine pancreatic insufficiency, functional -GI panel and c diff negative -CT showed evidence of mild rectal wall thickening suggestive of proctitis -Given his intractable diarrhea symptoms, I advise luminal evaluation with colonoscopy with  biopsies for further evaluation -Imodium 58m in morning and then 2 mg after each loose stool. No more than 16 mg/day -Agree with protein supplements and nutritional supplements -Patient needs stricture control of his glycemic control as his Hgb A1c is 15% which is severely elevated -Clear liquid diet tomorrow -Plan for colonoscopy Friday with Dr. TAlice Reichert-Following along with you    I reviewed the risks (including bleeding, perforation, infection, anesthesia complications, cardiac/respiratory complications), benefits and alternatives of colonoscopy. Patient consents to proceed.    Thank you for the consult. Please call with questions or concerns.  GReeves ForthKSpring [facility] ClinicGastroenter[facility] [phone](Cell)

## 2020-01-23 NOTE — Progress Notes (Signed)
   01/23/20 0500  Vitals  BP 103/79  MAP (mmHg) 88  BP Method Automatic  Pulse Rate 69  ECG Heart Rate 68  Resp 14  MEWS Score  MEWS Temp 0  MEWS Systolic 0  MEWS Pulse 0  MEWS RR 0  MEWS LOC 0  MEWS Score 0  MEWS Score Color Green  Patient alert, resting in bed.Will continue to monitor to ensure comfort and safety.

## 2020-01-23 NOTE — Progress Notes (Signed)
Initial Nutrition Assessment  DOCUMENTATION CODES:   Severe malnutrition in context of social or environmental circumstances, Underweight  INTERVENTION:  Will discontinue Ensure Enlive per patient request.  Provide Ensure Max Protein po BID, each supplement provides 150 kcal and 30 grams of protein. Patient prefers vanilla.  Left "Carbohydrate Counting for People with Diabetes" handout in Spanish from the [employer] with patient. He is not appropriate for full diet education at this time. From review of diet recall patient is not eating carbohydrates in excess of what is recommended on carbohydrate modified diet. Patient is severely malnourished and has been working on increasing intake of calories and protein. This is the main nutrition priority at this time.  NUTRITION DIAGNOSIS:   Severe Malnutrition related to social / environmental circumstances(inadequate oral intake, diarrhea) as evidenced by severe fat depletion, severe muscle depletion.  GOAL:   Patient will meet greater than or equal to 90% of their needs, Weight gain  MONITOR:   PO intake, Supplement acceptance, Labs, Weight trends, I & O's  REASON FOR ASSESSMENT:   Consult Diet education  ASSESSMENT:   44 year old male with PMHx of DM, gastroparesis, hx TB, hx COVID 04/23/2019, recent C. difficile colitis admitted with diarrhea, AKI.   Met with patient at bedside. He is well-known to this RD from previous admissions. Consult had stated that patient had requested a Spanish interpreter but when RD arrived at room patient stated he did not want an interpreter and that he can speak [place]. Patient reports his appetite had started to improve at home but overall was still decreased related to diarrhea. On last admission patient was reporting difficulty obtaining food. He reports there is a family member that has been cooking for him now. Reviewed food recall with patient and what he is eating is well  within a carbohydrate modified diet. Did leave handout with patient and reviewed a few basics but he is not appropriate for full diet education. Patient has been working on increasing calorie and protein intake. He is unable to afford his insulin at home. He does not like the Ensure Enlive that was ordered as he reports it is too sweet and causes nausea. He is amenable to drinking Ensure Max Protein as he has tolerated them before (prefers vanilla). He prefers to have his second ONS at bedtime. Patient reports he was able to eat 100% of his lunch today.  Patient reports he continues to lose weight. He was 61.2 kg on 11/18/2019. He is now 47.6 kg (104.94 lbs). If the weights in chart are accurate he has lost 13.6 kg (22.2% body weight) over the past 2 months, which is significant for time frame. This amount of weight loss is significant over one year time frame.  Medications reviewed and include: acidophilus, Novolog 0-9 units Q4hrs, Lantus 10 units QHS, MVI daily, nicotine patch, Carafate 1 gram TID and QHS, NS at 100 mL/hr.  Labs reviewed: CBG 101-220, Chloride 113, CO2 17.   NUTRITION - FOCUSED PHYSICAL EXAM:    Most Recent Value  Orbital Region  Severe depletion  Upper Arm Region  Severe depletion  Thoracic and Lumbar Region  Severe depletion  Buccal Region  Severe depletion  Temple Region  Severe depletion  Clavicle Bone Region  Severe depletion  Clavicle and Acromion Bone Region  Severe depletion  Scapular Bone Region  Severe depletion  Dorsal Hand  Severe depletion  Patellar Region  Severe depletion  Anterior Thigh Region  Severe depletion  Posterior Calf Region  Severe depletion  Edema (RD Assessment)  None  Hair  Reviewed  Eyes  Reviewed  Mouth  Reviewed  Skin  Reviewed  Nails  Reviewed     Diet Order:   Diet Order            Diet Carb Modified Fluid consistency: Thin; Room service appropriate? Yes  Diet effective now             EDUCATION NEEDS:   Education needs have  been addressed  Skin:  Skin Assessment: Reviewed RN Assessment  Last BM:  01/23/2020 large type 7  Height:   Ht Readings from Last 1 Encounters:  01/21/20 '5\' 7"'  (1.702 m)   Weight:   Wt Readings from Last 1 Encounters:  01/23/20 47.6 kg   Ideal Body Weight:  67.3 kg  BMI:  Body mass index is 16.44 kg/m.  Estimated Nutritional Needs:   Kcal:  1800-2000  Protein:  90-100 grams  Fluid:  1.5-1.8 L/day  Jacklynn Barnacle, MS, RD, LDN Pager number available on Amion

## 2020-01-23 NOTE — Plan of Care (Signed)
  Problem: Clinical Measurements: Goal: Will remain free from infection Outcome: Not Progressing   

## 2020-01-23 NOTE — Progress Notes (Signed)
Inpatient Diabetes Program Recommendations  AACE/ADA: New Consensus Statement on Inpatient Glycemic Control   Target Ranges:  Prepandial:   less than 140 mg/dL      Peak postprandial:   less than 180 mg/dL (1-2 hours)      Critically ill patients:  140 - 180 mg/dL   Results for Gregory Moody, Gregory Moody (MRN [id number]) as of 01/23/2020 10:27  Ref. Range 01/22/2020 08:01 01/22/2020 12:17 01/22/2020 16:46 01/22/2020 20:25 01/23/2020 00:05 01/23/2020 04:30 01/23/2020 08:07  Glucose-Capillary Latest Ref Range: 70 - 99 mg/dL 191 (H) 242 (H) 307 (H) 192 (H) 161 (H) 101 (H) 106 (H)   Review of Glycemic Control  Diabetes history: DM2 Outpatient Diabetes medications: Lantus 10 units QHS, Novolog 2 units TID with meals Current orders for Inpatient glycemic control: Lantus 10 units QHS, Novolog 0-9 units Q4H  Inpatient Diabetes Program Recommendations:   Insulin-Correction: Please consider changing CBGs to AC&HS and Novolog 0-9 units TID with meals and Novolog 0-5 units QHS.  Insulin-Meal Coverage: Please consider ordering Novolog 3 units TID with meals for meal coverage if patient eats at least 50% of meals.  HgbA1C: A1C >15.5% on 12/27/19 indicating an average glucose over 398 mg/dl over the past 2-3 months.  Thanks, Barnie Alderman, RN, MSN, CDE Diabetes Coordinator Inpatient Diabetes Program [phone] (Team Pager from 8am to 5pm)

## 2020-01-23 NOTE — Evaluation (Signed)
Occupational Therapy Evaluation Patient Details Name: Gregory Moody MRN: [id number] DOB: 08/25/1976 Today's Date: 01/23/2020    History of Present Illness Gregory Moody is a 44 y.o. male with medical history significant of diabetes mellitus, GERD, gastroparesis, DKA, anemia, tobacco abuse, esophageal candidiasis, COVID-19 infection 04/23/2019, recent C. difficile colitis, who presents with diarrhea.  Of note: Pt with active APS case regarding concerns over living conditions noted on past admission in March   Clinical Impression   Gregory Moody was seen for OT evaluation this date. Prior to hospital admission, pt was living at home with his mother. He reports he is generally independent in all basic activities of daily living, however this fluctuates depending on his blood sugar levels. He reports complications from his diabetes effect his vision, strength, and overall functional abilities significantly, but when his diabetes is well controlled he can generally walk, bathe, and dress independently. Pt. Currently pt demonstrates impairments as described below (See OT problem list) which functionally limit his ability to perform ADL/self-care tasks. Pt currently requires supervision for safety during functional mobility and standing ADL tasks. He requires set-up of grooming tasks, but can generally perform these without physical assistance. On this date pt educated on safe use of RW for functional mobility, BSC, strategies for implementing dietary and lifestyle changes into his daily routines/activities to support adherence to diabetic diet as recommended by hospital staff, and routines modifications to support safety and functional independence upon hospital discharge.  Pt would benefit from skilled OT to address noted impairments and functional limitations (see below for any additional details) in order to maximize safety and independence while minimizing falls risk and caregiver  burden. Upon hospital discharge, recommend HHOT to maximize pt safety and return to functional independence during meaningful occupations of daily life.    Follow Up Recommendations  Home health OT;Supervision - Intermittent    Equipment Recommendations  3 in 1 bedside commode    Recommendations for Other Services       Precautions / Restrictions Precautions Precautions: Fall Restrictions Weight Bearing Restrictions: No      Mobility Bed Mobility Overal bed mobility: Modified Independent             General bed mobility comments: Comes to sitting EOB with increased time to perform.  Transfers Overall transfer level: Needs assistance Equipment used: Rolling walker (2 wheeled) Transfers: Sit to/from [facility] Sit to Stand: Supervision Stand pivot transfers: Supervision            Balance Overall balance assessment: Needs assistance Sitting-balance support: No upper extremity supported;Feet supported Sitting balance-Leahy Scale: Good Sitting balance - Comments: Steady static/dynamic sitting during functional tasks.   Standing balance support: No upper extremity supported;During functional activity Standing balance-Leahy Scale: Fair Standing balance comment: Close supervision for safety with functional mobility. Steady static standing at sink to perform HH.                           ADL either performed or assessed with clinical judgement   ADL Overall ADL's : Needs assistance/impaired                                     Functional mobility during ADLs: Rolling walker;Cueing for safety;Set up;Supervision/safety General ADL Comments: Pt functionally limited by generalized weakness, decreased safety awareness, and decreased activity tolerance this date. He requires consistent cueing for safety during functional  mobility and ADL management. He performs SPT to [facility] with supervision, but no physical assist required. He is  able to stand at his room sink to perform hand hygiene this date. Endorses significant falls history over past month.     Vision Patient Visual Report: No change from baseline Additional Comments: Pt reports his diabetes causes him to have difficulty with vision, however, he does not have glasses.     Perception     Praxis      Pertinent Vitals/Pain Pain Assessment: No/denies pain     Hand Dominance Right   Extremity/Trunk Assessment Upper Extremity Assessment Upper Extremity Assessment: Generalized weakness(BUE generally weak with notable decreased muscle mass/tone. Decreased Diagonal.)   Lower Extremity Assessment Lower Extremity Assessment: Generalized weakness   Cervical / Trunk Assessment Cervical / Trunk Assessment: Kyphotic   Communication Communication Communication: No difficulties   Cognition Arousal/Alertness: Awake/alert Behavior During Therapy: WFL for tasks assessed/performed Overall Cognitive Status: Within Functional Limits for tasks assessed                                 General Comments: Pt is A and O x 4    General Comments  Pt notably cachectic with multiple scabbed wounds on his elbows, forearms, and hands.    Exercises Other Exercises Other Exercises: Pt educated on falls prevention strategies for home and hospital, safe use of AE/DME for ADL management, routines modifications to support safety and functional independence upon hosptial DC. Pt questions anwered within OT scope regarding diabetic information packet provided by nsg staff. Pt endorses plan to work with his mother to improve both of their diets and make better choices regarding diabetic diet. Other Exercises: OT engages pt in toileting and standing grooming tasks at sink this date. See ADL section for additional detail.   Shoulder Instructions      Home Living Family/patient expects to be discharged to:: Private residence Living Arrangements: Parent Available Help at  Discharge: Family;Available PRN/intermittently Type of Home: Mobile home Home Access: Stairs to enter Entrance Stairs-Number of Steps: 4 Entrance Stairs-Rails: Right;Left Home Layout: One level     Bathroom Shower/Tub: Occupational psychologist: Standard     Home Equipment: Environmental consultant - 2 wheels;Cane - quad;Cane - single point          Prior Functioning/Environment Level of Independence: Independent with assistive device(s)        Comments: Generally using quad cane. Endorses at least 10 falls in the last month due to weakness,.        OT Problem List: Decreased strength;Decreased activity tolerance;Decreased knowledge of use of DME or AE;Decreased safety awareness;Impaired balance (sitting and/or standing)      OT Treatment/Interventions: Self-care/ADL training;Therapeutic exercise;Neuromuscular education;Energy conservation;DME and/or AE instruction;Therapeutic activities;Visual/perceptual remediation/compensation;Balance training    OT Goals(Current goals can be found in the care plan section) Acute Rehab OT Goals Patient Stated Goal: To be healthier at home OT Goal Formulation: With patient Time For Goal Achievement: 02/06/20 Potential to Achieve Goals: Good ADL Goals Pt Will Perform Grooming: with modified independence;sitting Pt Will Perform Upper Body Dressing: with modified independence;sitting Pt Will Perform Lower Body Dressing: with modified independence;sit to/from stand(With LRAD PRN for improved safety and functional indepencence.) Additional ADL Goal #1: Pt will independnetly verbalize a plan to implement at least 3 learned falls prevention strategies into his daily routines/home environment for improved safety and functional indepencence upon hospital DC.  OT Frequency:  Min 1X/week   Barriers to D/C: Decreased caregiver support;Inaccessible home environment          Co-evaluation              AM-PAC OT "6 Clicks" Daily Activity     Outcome  Measure Help from another person eating meals?: A Little Help from another person taking care of personal grooming?: A Little Help from another person toileting, which includes using toliet, bedpan, or urinal?: A Little Help from another person bathing (including washing, rinsing, drying)?: A Little Help from another person to put on and taking off regular upper body clothing?: A Little Help from another person to put on and taking off regular lower body clothing?: A Little 6 Click Score: 18   End of Session Equipment Utilized During Treatment: Gait belt;Rolling walker  Activity Tolerance: Patient tolerated treatment well Patient left: in bed;with call bell/phone within reach;with bed alarm set  OT Visit Diagnosis: Muscle weakness (generalized) (M62.81);Adult, failure to thrive (R62.7)                Time: 9432-7614 OT Time Calculation (min): 39 min Charges:  OT Evaluation $OT Eval Moderate Complexity: 1 Mod OT Treatments $Self Care/Home Management : 23-37 mins  Shara Blazing, M.S., OTR/L Ascom: [phone] 01/23/20, 12:12 PM

## 2020-01-23 NOTE — Evaluation (Signed)
Physical Therapy Evaluation Patient Details Name: Gregory Moody MRN: [id number] DOB: [birth year] Today's Date: 01/23/2020   History of Present Illness  Gregory Moody is a 44 y.o. male with medical history significant of diabetes mellitus, GERD, gastroparesis, DKA, anemia, tobacco abuse, esophageal candidiasis, COVID-19 infection 04/23/2019, recent C. difficile colitis, who presents with diarrhea.  Of note: Pt with active APS case regarding concerns over living conditions noted on past admission in March  Clinical Impression  Pt is a pleasant 44 year old male who was admitted for diarrhea. Pt performs bed mobility with mod I and transfers with supervision without AD. Pt able to stand pivot transfer on/off BSC. Deferred further ambulation due to fatigue from constant tolieting tasks. Pt demonstrates deficits with strength (B UE), balance, and mobility. Pt reports he would benefit from [facility] as he has multiple falls in home due to bathroom urgency. Appears somewhat motivated to participate. Would monitor BP in future sessions for mobility tasks. Would benefit from skilled PT to address above deficits and promote optimal return to PLOF. Recommend transition to [place] upon discharge from acute hospitalization.     Follow Up Recommendations Home health PT;Supervision - Intermittent    Equipment Recommendations  3in1 (PT)    Recommendations for Other Services       Precautions / Restrictions Precautions Precautions: Fall Restrictions Weight Bearing Restrictions: No      Mobility  Bed Mobility Overal bed mobility: Modified Independent             General bed mobility comments: Safe technique with ease of effort transitioning from sit->supine. Requests assistance for donning blankets  Transfers Overall transfer level: Needs assistance Equipment used: None Transfers: Stand Pivot Transfers Sit to Stand: Supervision Stand pivot transfers: Supervision       General  transfer comment: Pushes from [facility] in order to stand. Able to maintain standing balance, turn and safely sit on bedside. He refuses further mobility attempts at this time  Ambulation/Gait             General Gait Details: deferred due to fatigue from multiple transfers on/off [facility]  Stairs            Wheelchair Mobility    Modified Rankin (Stroke Patients Only)       Balance Overall balance assessment: Needs assistance Sitting-balance support: No upper extremity supported;Feet supported Sitting balance-Leahy Scale: Good Sitting balance - Comments: Steady static/dynamic sitting during functional tasks.   Standing balance support: No upper extremity supported;During functional activity Standing balance-Leahy Scale: Fair Standing balance comment: Close supervision for safety with functional mobility. Steady static standing at sink to perform HH.                             Pertinent Vitals/Pain Pain Assessment: No/denies pain    Home Living Family/patient expects to be discharged to:: Private residence Living Arrangements: Parent Available Help at Discharge: Family;Available PRN/intermittently Type of Home: Mobile home Home Access: Stairs to enter Entrance Stairs-Rails: Right;Left Entrance Stairs-Number of Steps: 4 Home Layout: One level Home Equipment: Walker - 2 wheels;Cane - quad;Cane - single point      Prior Function Level of Independence: Independent with assistive device(s)         Comments: Generally using quad cane. Endorses at least 10 falls in the last month due to weakness,.     Hand Dominance   Dominant Hand: Right    Extremity/Trunk Assessment   Upper Extremity Assessment Upper Extremity  Assessment: Generalized weakness(B UE grossly 3+/5)    Lower Extremity Assessment Lower Extremity Assessment: Generalized weakness(B LE grossly 4/5)    Cervical / Trunk Assessment Cervical / Trunk Assessment: Kyphotic  Communication    Communication: No difficulties  Cognition Arousal/Alertness: Awake/alert Behavior During Therapy: WFL for tasks assessed/performed Overall Cognitive Status: Within Functional Limits for tasks assessed                                 General Comments: Pt is A and O x 4       General Comments General comments (skin integrity, edema, etc.): Pt notably cachectic with multiple scabbed wounds on his elbows, forearms, and hands.    Exercises Other Exercises Other Exercises: Pt educated on falls prevention strategies for home and hospital, safe use of AE/DME for ADL management, routines modifications to support safety and functional independence upon hosptial DC. Pt questions anwered within OT scope regarding diabetic information packet provided by nsg staff. Pt endorses plan to work with his mother to improve both of their diets and make better choices regarding diabetic diet. Other Exercises: Pt received on BSC upon arrival. Pt with close supervision for hygiene, prefers to stand for cleaning body. Good trunkal rotation for tolieting tasks. No dizziness with exertion noted   Assessment/Plan    PT Assessment Patient needs continued PT services  PT Problem List Decreased strength;Decreased range of motion;Decreased activity tolerance;Decreased balance;Decreased mobility;Decreased knowledge of use of DME;Decreased safety awareness       PT Treatment Interventions DME instruction;Gait training;Stair training;Functional mobility training;Therapeutic activities;Therapeutic exercise;Balance training;Neuromuscular re-education;Patient/family education    PT Goals (Current goals can be found in the Care Plan section)  Acute Rehab PT Goals Patient Stated Goal: to stop his diarrhea PT Goal Formulation: With patient Time For Goal Achievement: 02/06/20 Potential to Achieve Goals: Good    Frequency Min 2X/week   Barriers to discharge        Co-evaluation               AM-PAC  PT "6 Clicks" Mobility  Outcome Measure Help needed turning from your back to your side while in a flat bed without using bedrails?: None Help needed moving from lying on your back to sitting on the side of a flat bed without using bedrails?: None Help needed moving to and from a bed to a chair (including a wheelchair)?: A Little Help needed standing up from a chair using your arms (e.g., wheelchair or bedside chair)?: A Little Help needed to walk in hospital room?: A Little Help needed climbing 3-5 steps with a railing? : A Little 6 Click Score: 20    End of Session   Activity Tolerance: Patient tolerated treatment well Patient left: in bed;with bed alarm set;with call bell/phone within reach Nurse Communication: Mobility status PT Visit Diagnosis: Unsteadiness on feet (R26.81);Muscle weakness (generalized) (M62.81)    Time: 1017-5102 PT Time Calculation (min) (ACUTE ONLY): 17 min   Charges:   PT Evaluation $PT Eval Low Complexity: 1 Low PT Treatments $Therapeutic Activity: 8-22 mins        [name], PT, DPT [phone]   Gregory Moody 01/23/2020, 11:44 AM

## 2020-01-23 NOTE — Progress Notes (Signed)
   01/23/20 0434  Vitals  Temp 98.4 F (36.9 C)  Temp Source Oral  BP (!) 75/55  MAP (mmHg) (!) 62  BP Location Left Arm  BP Method Automatic  Patient Position (if appropriate) Lying  Pulse Rate 70  Pulse Rate Source Monitor  Resp 20  Oxygen Therapy  SpO2 100 %  O2 Device Room Air  MEWS Score  MEWS Temp 0  MEWS Systolic 2  MEWS Pulse 0  MEWS RR 0  MEWS LOC 0  MEWS Score 2  MEWS Score Color Yellow  Standing hypertensive emergency order in intiated , 500cc bolus administered, NP notified.

## 2020-01-23 NOTE — Progress Notes (Signed)
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 difficile colitis,who presents with diarrhea.  Repeated stool negative for C. difficile toxin antigen, gastric panel negative also. Is very cachectic, with generalized weaknesses  Recently hospitalized from 3/18-3/25 due to DKA, esophageal candidiasis and C. difficile colitis. Patient was treated with fluconazole and oral vancomycin for 10 days.  On admission he was hypotensive with a blood pressure of 85/65 improved with 2 L NS IV fluid to 122/91,    Consultants:   GI  Procedures:    Antimicrobials: Antibiotics for 01/21/2020 p.o. vancomycin, ciprofloxacin till 01/22/2020   Subjective: Patient still with diarrhea.  Reports since last night has 6 bouts  Objective: Vitals:   01/23/20 1021 01/23/20 1149 01/23/20 1209 01/23/20 1710  BP: 97/63 (!) 149/100 126/88 110/76  Pulse: 74 76  81  Resp:    18  Temp: 97.7 F (36.5 C) 97.7 F (36.5 C)  97.7 F (36.5 C)  TempSrc: Oral Oral  Oral  SpO2: 100% 100%  100%  Weight:      Height:        Intake/Output Summary (Last 24 hours) at 01/23/2020 1804 Last data filed at 01/23/2020 1647 Gross per 24 hour  Intake 3809.68 ml  Output --  Net 3809.68 ml   Filed Weights   01/21/20 1215 01/22/20 0430 01/23/20 0434  Weight: 46.8 kg 49.2 kg 47.6 kg    Examination:  General exam: Appears calm and comfortable  Respiratory system: Clear to auscultation. Respiratory effort normal. Cardiovascular system: S1 & S2 heard, RRR. No JVD, murmurs, rubs, gallops or clicks. No pedal edema. Gastrointestinal system: Abdomen is nondistended, soft and nontender. No  organomegaly or masses felt. Normal bowel sounds heard. Central nervous system: Alert and oriented. No focal neurological deficits. Extremities: Symmetric 5 x 5 power. Skin: No rashes, lesions or ulcers Psychiatry: Judgement and insight appear normal. Mood & affect appropriate.     Data Reviewed: I have personally reviewed following labs and imaging studies  CBC: Recent Labs  Lab 01/21/20 1231 01/22/20 0433 01/23/20 0530  WBC 6.4 5.7 5.4  NEUTROABS 4.8  --   --   HGB 8.7* 8.8* 7.6*  HCT 26.9* 27.0* 23.0*  MCV 94.1 93.1 93.1  PLT 420* 393 811   Basic Metabolic Panel: Recent Labs  Lab 01/21/20 1231 01/22/20 0433 01/23/20 0530 01/23/20 0556  NA 127* 131* 135  --   K 4.6 4.4 3.9  --   CL 98 104 113*  --   CO2 21* 21* 17*  --   GLUCOSE 448* 201* 101*  --   BUN 22* 20 16  --   CREATININE 1.45* 0.99 0.98  --   CALCIUM 8.6* 8.8* 8.2*  --   MG  --   --   --  1.8   GFR: Estimated Creatinine Clearance: 64.8 mL/min (by C-G formula based on SCr of 0.98 mg/dL). Liver Function Tests: Recent Labs  Lab 01/21/20 1231  AST 21  ALT 29  ALKPHOS 108  BILITOT 0.5  PROT 7.5  ALBUMIN 3.0*   Recent Labs  Lab 01/21/20 1231  LIPASE 28   No results for input(s): AMMONIA in the last 168 hours. Coagulation Profile: No results for input(s): INR, PROTIME in  the last 168 hours. Cardiac Enzymes: No results for input(s): CKTOTAL, CKMB, CKMBINDEX, TROPONINI in the last 168 hours. BNP (last 3 results) No results for input(s): PROBNP in the last 8760 hours. HbA1C: No results for input(s): HGBA1C in the last 72 hours. CBG: Recent Labs  Lab 01/23/20 0005 01/23/20 0430 01/23/20 0807 01/23/20 1146 01/23/20 1708  GLUCAP 161* 101* 106* 220* 274*   Lipid Profile: No results for input(s): CHOL, HDL, LDLCALC, TRIG, CHOLHDL, LDLDIRECT in the last 72 hours. Thyroid Function Tests: No results for input(s): TSH, T4TOTAL, FREET4, T3FREE, THYROIDAB in the last 72 hours. Anemia Panel: No  results for input(s): VITAMINB12, FOLATE, FERRITIN, TIBC, IRON, RETICCTPCT in the last 72 hours. Sepsis Labs: Recent Labs  Lab 01/21/20 1231 01/21/20 1555  LATICACIDVEN 1.2 1.5    Recent Results (from the past 240 hour(s))  Respiratory Panel by RT PCR (Flu A&B, Covid) - Nasopharyngeal Swab     Status: None   Collection Time: 01/21/20  3:55 PM   Specimen: Nasopharyngeal Swab  Result Value Ref Range Status   SARS Coronavirus 2 by RT PCR NEGATIVE NEGATIVE Final    Comment: (NOTE) SARS-CoV-2 target nucleic acids are NOT DETECTED. The SARS-CoV-2 RNA is generally detectable in upper respiratoy specimens during the acute phase of infection. The lowest concentration of SARS-CoV-2 viral copies this assay can detect is 131 copies/mL. A negative result does not preclude SARS-Cov-2 infection and should not be used as the sole basis for treatment or other patient management decisions. A negative result may occur with  improper specimen collection/handling, submission of specimen other than nasopharyngeal swab, presence of viral mutation(s) within the areas targeted by this assay, and inadequate number of viral copies (<131 copies/mL). A negative result must be combined with clinical observations, patient history, and epidemiological information. The expected result is Negative. Fact Sheet for Patients:  PinkCheek.be Fact Sheet for Healthcare Providers:  GravelBags.it This test is not yet ap proved or cleared by the [place] FDA and  has been authorized for detection and/or diagnosis of SARS-CoV-2 by FDA under an Emergency Use Authorization (EUA). This EUA will remain  in effect (meaning this test can be used) for the duration of the COVID-19 declaration under Section 564(b)(1) of the Act, 21 U.S.C. section 360bbb-3(b)(1), unless the authorization is terminated or revoked sooner.    Influenza A by PCR NEGATIVE NEGATIVE Final    Influenza B by PCR NEGATIVE NEGATIVE Final    Comment: (NOTE) The Xpert Xpress SARS-CoV-2/FLU/RSV assay is intended as an aid in  the diagnosis of influenza from Nasopharyngeal swab specimens and  should not be used as a sole basis for treatment. Nasal washings and  aspirates are unacceptable for Xpert Xpress SARS-CoV-2/FLU/RSV  testing. Fact Sheet for Patients: PinkCheek.be Fact Sheet for Healthcare Providers: GravelBags.it This test is not yet approved or cleared by the [place] FDA and  has been authorized for detection and/or diagnosis of SARS-CoV-2 by  FDA under an Emergency Use Authorization (EUA). This EUA will remain  in effect (meaning this test can be used) for the duration of the  Covid-19 declaration under Section 564(b)(1) of the Act, 21  U.S.C. section 360bbb-3(b)(1), unless the authorization is  terminated or revoked. Performed at [facility], [address] [place],  [address]   Culture, blood (Routine X 2) w Reflex to ID Panel     Status: None (Preliminary result)   Collection Time: 01/21/20  4:54 PM   Specimen: BLOOD  Result Value Ref Range  Status   Specimen Description BLOOD BLOOD RIGHT ARM  Final   Special Requests   Final    BOTTLES DRAWN AEROBIC AND ANAEROBIC Blood Culture adequate volume   Culture   Final    NO GROWTH 2 DAYS Performed at [facility], [address] [place], [place] [address]    Report Status PENDING  Incomplete  Culture, blood (Routine X 2) w Reflex to ID Panel     Status: None (Preliminary result)   Collection Time: 01/21/20  4:54 PM   Specimen: BLOOD  Result Value Ref Range Status   Specimen Description BLOOD BLOOD RIGHT ARM  Final   Special Requests   Final    BOTTLES DRAWN AEROBIC AND ANAEROBIC Blood Culture adequate volume   Culture   Final    NO GROWTH 2 DAYS Performed at [facility], [address] [place], [place]  [address]    Report Status PENDING  Incomplete  Gastrointestinal Panel by PCR , Stool     Status: None   Collection Time: 01/21/20 10:00 PM   Specimen: Stool  Result Value Ref Range Status   Campylobacter species NOT DETECTED NOT DETECTED Final   Plesimonas shigelloides NOT DETECTED NOT DETECTED Final   Salmonella species NOT DETECTED NOT DETECTED Final   Yersinia enterocolitica NOT DETECTED NOT DETECTED Final   Vibrio species NOT DETECTED NOT DETECTED Final   Vibrio cholerae NOT DETECTED NOT DETECTED Final   Enteroaggregative E coli (EAEC) NOT DETECTED NOT DETECTED Final   Enteropathogenic E coli (EPEC) NOT DETECTED NOT DETECTED Final   Enterotoxigenic E coli (ETEC) NOT DETECTED NOT DETECTED Final   Shiga like toxin producing E coli (STEC) NOT DETECTED NOT DETECTED Final   Shigella/Enteroinvasive E coli (EIEC) NOT DETECTED NOT DETECTED Final   Cryptosporidium NOT DETECTED NOT DETECTED Final   Cyclospora cayetanensis NOT DETECTED NOT DETECTED Final   Entamoeba histolytica NOT DETECTED NOT DETECTED Final   Giardia lamblia NOT DETECTED NOT DETECTED Final   Adenovirus F40/41 NOT DETECTED NOT DETECTED Final   Astrovirus NOT DETECTED NOT DETECTED Final   Norovirus GI/GII NOT DETECTED NOT DETECTED Final   Rotavirus A NOT DETECTED NOT DETECTED Final   Sapovirus (I, II, IV, and V) NOT DETECTED NOT DETECTED Final    Comment: Performed at [facility], [address] [place], [place] [address]  C Difficile Quick Screen w PCR reflex     Status: None   Collection Time: 01/21/20 10:00 PM   Specimen: STOOL  Result Value Ref Range Status   C Diff antigen NEGATIVE NEGATIVE Final   C Diff toxin NEGATIVE NEGATIVE Final   C Diff interpretation No C. difficile detected.  Final    Comment: Performed at [facility], [address] [place], [place] [address]         Radiology Studies: No results found.      Scheduled Meds: . acidophilus  1 capsule Oral TID  . aspirin EC   81 mg Oral Daily  . enoxaparin (LOVENOX) injection  40 mg Subcutaneous Q24H  . FLUoxetine  10 mg Oral Daily  . insulin aspart  0-9 Units Subcutaneous Q4H  . insulin glargine  10 Units Subcutaneous QHS  . multivitamin with minerals  1 tablet Oral Daily  . nicotine  21 mg Transdermal Daily  . Ensure Max Protein  11 oz Oral BID BM  . sucralfate  1 g Oral TID WC & HS   Continuous Infusions: . sodium chloride 100 mL/hr  at 01/23/20 1647  . sodium chloride      Assessment & Plan:   Principal Problem:   Diarrhea Active Problems:   GERD (gastroesophageal reflux disease)   AKI (acute kidney injury) ([place])   Hypotension   Anemia of chronic disease   Protein-calorie malnutrition, severe   Tobacco abuse   C. difficile colitis   Urinary retention   Diabetes mellitus without complication (HCC)   Diarrhea: -Monitoring closely -nonbilious nonbloody Recently hospitalized from 3/18-3/25 due to DKA, esophageal candidiasis and C. difficile colitis. Patient was treated with fluconazole and oral vancomycin for 10 days.  Repeat stool study for C. difficile was negative toxin was negative,  -GI pathogen were all negative -Patient was initiated on admission on p.o. vancomycin and ciprofloxacin both were DC'd -Patient does not have any fever,chills, negative sign of sepsis exception of hypertension -All antibiotics will be DC'd for now -We will monitor closely -Lactobacillus added to the regimen -We will check for Hepatitis panel, HIV screening, CRP,  ESR -IV fluid: 2 L normal saline, followed by 100 cc/h  GI input was appreciated-we will start Imodium, colonoscopy Friday  Recent history of C. difficile colitis --Repeat stool studies with toxins antigen negative -Withholding p.o. vancomycin  GERD (gastroesophageal reflux disease): -Protonix --will be DC'd as it would increase his chance of recurrent C. difficile  AKI (acute kidney injury) ([place]):  Likely due to dehydration -IV fluid  as above -Follow-up renal function by BMP  Hypotension:  Likely due to dehydration. Patient does NOT have fever or leukocytosis, and lactic acid is normal. Does not seem to have sepsis -IV fluid as above  Diabetes mellitus without complication: Most recent A1c15.0, poorly controled.  Patient is takingNovoLog and Lantusat home -10 units nightly -Diabetic coordinator consulted,  -CBG QA CHS, SSI   Anemia of chronic disease: Hemoglobin 8.7 down from recent 10.0. No active bleeding -Follow-up with CBC  Protein-calorie malnutrition, severe -Start Ensure  Tobacco abuse -Nicotine patch  Urinary retention: -In and out Foley catheter was done       DVT prophylaxis: Lovenox subcu Code Status: Full Family Communication: None at bedside Disposition Plan: Back home Barrier: Still with diarrhea.  Will need to have his diarrhea slowed down prior to discharge, colonoscopy on friday       LOS: 2 days   Time spent: 45 min with more than 50% COC    Nolberto Hanlon, MD [facility] Pager [phone]  If 7PM-7AM, please contact night-coverage [email or web address] Password [facility] 01/23/2020, 6:04 PM

## 2020-01-24 LAB — CBC
HCT: 25.6 % — ABNORMAL LOW (ref 39.0–52.0)
Hemoglobin: 8.8 g/dL — ABNORMAL LOW (ref 13.0–17.0)
MCH: 31 pg (ref 26.0–34.0)
MCHC: 34.4 g/dL (ref 30.0–36.0)
MCV: 90.1 fL (ref 80.0–100.0)
Platelets: 407 10*3/uL — ABNORMAL HIGH (ref 150–400)
RBC: 2.84 MIL/uL — ABNORMAL LOW (ref 4.22–5.81)
RDW: 13.5 % (ref 11.5–15.5)
WBC: 5.4 10*3/uL (ref 4.0–10.5)
nRBC: 0 % (ref 0.0–0.2)

## 2020-01-24 LAB — GLUCOSE, CAPILLARY
Glucose-Capillary: 135 mg/dL — ABNORMAL HIGH (ref 70–99)
Glucose-Capillary: 162 mg/dL — ABNORMAL HIGH (ref 70–99)
Glucose-Capillary: 168 mg/dL — ABNORMAL HIGH (ref 70–99)
Glucose-Capillary: 169 mg/dL — ABNORMAL HIGH (ref 70–99)
Glucose-Capillary: 207 mg/dL — ABNORMAL HIGH (ref 70–99)
Glucose-Capillary: 235 mg/dL — ABNORMAL HIGH (ref 70–99)

## 2020-01-24 LAB — BASIC METABOLIC PANEL
Anion gap: 6 (ref 5–15)
BUN: 19 mg/dL (ref 6–20)
CO2: 18 mmol/L — ABNORMAL LOW (ref 22–32)
Calcium: 8.6 mg/dL — ABNORMAL LOW (ref 8.9–10.3)
Chloride: 111 mmol/L (ref 98–111)
Creatinine, Ser: 0.96 mg/dL (ref 0.61–1.24)
GFR calc Af Amer: >60 mL/min (ref >60)
GFR calc non Af Amer: >60 mL/min (ref >60)
Glucose, Bld: 164 mg/dL — ABNORMAL HIGH (ref 70–99)
Potassium: 4.3 mmol/L (ref 3.5–5.1)
Sodium: 135 mmol/L (ref 135–145)

## 2020-01-24 MED ORDER — POLYETHYLENE GLYCOL 3350 17 GM/SCOOP PO POWD
1.0000 | Freq: Once | ORAL | Status: AC
  Administered 2020-01-24: 255 g via ORAL
  Filled 2020-01-24: qty 255

## 2020-01-24 NOTE — H&P (View-Only) (Signed)
GI Inpatient Follow-up Note  Subjective:  Patient seen in follow-up for intractable diarrhea. He reports he continues to have diarrhea without any frank hematochezia or melena. He has been >6 times since last night. No nausea, vomiting, abdominal pain, fever. He is not wanting clear liquid diet. Diabetes coordinator saw pt today.   Scheduled Inpatient Medications:  . acidophilus  1 capsule Oral TID  . aspirin EC  81 mg Oral Daily  . enoxaparin (LOVENOX) injection  40 mg Subcutaneous Q24H  . FLUoxetine  10 mg Oral Daily  . insulin aspart  0-9 Units Subcutaneous Q4H  . insulin glargine  10 Units Subcutaneous QHS  . multivitamin with minerals  1 tablet Oral Daily  . nicotine  21 mg Transdermal Daily  . polyethylene glycol powder  1 Container Oral Once  . Ensure Max Protein  11 oz Oral BID BM  . sucralfate  1 g Oral TID WC & HS    Continuous Inpatient Infusions:   . sodium chloride 100 mL/hr at 01/23/20 2035  . sodium chloride      PRN Inpatient Medications:  acetaminophen, loperamide, ondansetron (ZOFRAN) IV  Review of Systems: Constitutional: Weight is stable.  Eyes: No changes in vision. ENT: No oral lesions, sore throat.  GI: see HPI.  Heme/Lymph: No easy bruising.  CV: No chest pain.  GU: No hematuria.  Integumentary: No rashes.  Neuro: No headaches.  Psych: No depression/anxiety.  Endocrine: No heat/cold intolerance.  Allergic/Immunologic: No urticaria.  Resp: No cough, SOB.  Musculoskeletal: No joint swelling.    Physical Examination: BP 112/75 (BP Location: Left Arm)   Pulse 79   Temp 98.2 F (36.8 C)   Resp 18   Ht '5\' 7"'  (1.702 m)   Wt 47.6 kg   SpO2 100%   BMI 16.44 kg/m  Gen: NAD, alert and oriented x 4 HEENT: PEERLA, EOMI, Neck: supple, no JVD or thyromegaly Chest: CTA bilaterally, no wheezes, crackles, or other adventitious sounds CV: RRR, no m/g/c/r Abd: soft, NT, ND, +BS in all four quadrants; no HSM, guarding, ridigity, or rebound  tenderness Ext: no edema, well perfused with 2+ pulses, Skin: no rash or lesions noted Lymph: no LAD  Data: Lab Results  Component Value Date   WBC 5.4 01/24/2020   HGB 8.8 (L) 01/24/2020   HCT 25.6 (L) 01/24/2020   MCV 90.1 01/24/2020   PLT 407 (H) 01/24/2020   Recent Labs  Lab 01/22/20 0433 01/23/20 0530 01/24/20 0622  HGB 8.8* 7.6* 8.8*   Lab Results  Component Value Date   NA 135 01/24/2020   K 4.3 01/24/2020   CL 111 01/24/2020   CO2 18 (L) 01/24/2020   BUN 19 01/24/2020   CREATININE 0.96 01/24/2020   Lab Results  Component Value Date   ALT 29 01/21/2020   AST 21 01/21/2020   ALKPHOS 108 01/21/2020   BILITOT 0.5 01/21/2020   No results for input(s): APTT, INR, PTT in the last 168 hours. Assessment/Plan:  44 y/o [name] with a PMH of GERD, uncontrolled T2DM with gastroparesis, Hx of DKA, tobacco abuse, COVID-19 infection 04/2019, nutritional anemia, and failure to thrive admitted for intractable diarrhea  1. Acute on chronic diarrhea 2. Hx of c diff colitis - treated with 10-day course of vancomycin last month. Repeat C diff testing on 4/12 was negative.  3. Nutritional anemia - no signs of overt GI blood loss 4. Abnormal CT of rectum - proctitis  -Continue with plan of care with colonoscopy tomorrow  with Dr. Alice Reichert with biopsies for further evaluation of intractable diarrhea. Infectious studies negative. ESR and CRP are elevated. -Clear liquid diet all day today -NPO after midnight.  -Bowel prep around 1700 today -Colonoscopy tomorrow -See procedure note for findings and further recommendations   Please call with questions or concerns.    Octavia Bruckner, PA-C [facility] [phone] (Cell)

## 2020-01-24 NOTE — Plan of Care (Signed)
  Problem: Education: Goal: Knowledge of General Education information will improve Description: Including pain rating scale, medication(s)/side effects and non-pharmacologic comfort measures Outcome: Not Progressing   

## 2020-01-24 NOTE — Progress Notes (Signed)
Inpatient Diabetes Program Recommendations  AACE/ADA: New Consensus Statement on Inpatient Glycemic Control   Target Ranges:  Prepandial:   less than 140 mg/dL      Peak postprandial:   less than 180 mg/dL (1-2 hours)      Critically ill patients:  140 - 180 mg/dL   Results for MAVERIC, DEBONO (MRN [id number]) as of 01/24/2020 11:22  Ref. Range 01/23/2020 08:07 01/23/2020 11:46 01/23/2020 17:08 01/23/2020 20:26 01/23/2020 23:10 01/24/2020 05:01 01/24/2020 07:51  Glucose-Capillary Latest Ref Range: 70 - 99 mg/dL 106 (H) 220 (H) 274 (H) 319 (H) 152 (H) 135 (H) 162 (H)   Review of Glycemic Control  Diabetes history:DM2 Outpatient Diabetes medications:Lantus 10 units QHS, Novolog 2 units TID with meals Current orders for Inpatient glycemic control:Lantus 10 units QHS, Novolog 0-9 units Q4H  Inpatient Diabetes Program Recommendations:   HgbA1C: A1C >15.5% on 12/27/19 indicating an average glucose over 398 mg/dl over the past 2-3 months. Diabetes Coordinator spoke with patient on 01/22/20 and patient reported that sometimes he can not afford to pay to see a provider or get medications. TOC consulted on 01/22/20 but no note in the chart at this time.   NOTE: Noted diet changed to clear liquids and patient will have colonoscopy on Friday 4/16.  When solid diet resumed, would recommend adding Novolog 3 units TID with meals if patient eats at least 50% of meals.  Thanks, Barnie Alderman, RN, MSN, CDE Diabetes Coordinator Inpatient Diabetes Program [phone] (Team Pager from 8am to 5pm)

## 2020-01-24 NOTE — Progress Notes (Signed)
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 difficile colitis,who presents with diarrhea.  Repeated stool negative for C. difficile toxin antigen, gastric panel negative also. Is very cachectic, with generalized weaknesses  Recently hospitalized from 3/18-3/25 due to DKA, esophageal candidiasis and C. difficile colitis. Patient was treated with fluconazole and oral vancomycin for 10 days.  On admission he was hypotensive with a blood pressure of 85/65 improved with 2 L NS IV fluid to 122/91,    Consultants:   GI  Procedures:    Antimicrobials: Antibiotics for 01/21/2020 p.o. vancomycin, ciprofloxacin till 01/22/2020   Subjective: Diarrhea slowed down with Imodium, no abdominal pain, nausea, vomiting  Objective: Vitals:   01/23/20 1934 01/23/20 2309 01/24/20 0500 01/24/20 0750  BP: 128/87 105/70 105/70 111/80  Pulse: 86 86 70 72  Resp: _0 Temp: 98.4 F (36.9 C) 98.2 F (36.8 C) 98 F (36.7 C) 98.1 F (36.7 C)  TempSrc:    Oral  SpO2: 100% 100% 100% 100%  Weight:      Height:        Intake/Output Summary (Last 24 hours) at 01/24/2020 0830 Last data filed at 01/23/2020 1647 Gross per 24 hour  Intake 3249.68 ml  Output -  Net 3249.68 ml   Filed Weights   01/21/20 1215 01/22/20 0430 01/23/20 0434  Weight: 46.8 kg 49.2 kg 47.6 kg    Examination:  General exam: Appears calm and comfortable, NAD Respiratory system: Clear to auscultation. Respiratory effort normal.  No wheeze rales rhonchi  cardiovascular system: S1 & S2 heard, RRR. No JVD, murmurs, rubs, gallops or clicks. a. Gastrointestinal system: Abdomen is  nondistended, soft and nontender. No organomegaly or masses felt. Normal bowel sounds heard.  No rebound Central nervous system: Alert and oriented. No focal neurological deficits. Extremities: No edema Skin: Warm dry Psychiatry: Judgement and insight appear normal. Mood & affect appropriate.     Data Reviewed: I have personally reviewed following labs and imaging studies  CBC: Recent Labs  Lab 01/21/20 1231 01/22/20 0433 01/23/20 0530 01/24/20 0622  WBC 6.4 5.7 5.4 5.4  NEUTROABS 4.8  --   --   --   HGB 8.7* 8.8* 7.6* 8.8*  HCT 26.9* 27.0* 23.0* 25.6*  MCV 94.1 93.1 93.1 90.1  PLT 420* 393 374 038*   Basic Metabolic Panel: Recent Labs  Lab 01/21/20 1231 01/22/20 0433 01/23/20 0530 01/23/20 0556 01/24/20 0622  NA 127* 131* 135  --  135  K 4.6 4.4 3.9  --  4.3  CL 98 104 113*  --  111  CO2 21* 21* 17*  --  18*  GLUCOSE 448* 201* 101*  --  164*  BUN 22* 20 16  --  19  CREATININE 1.45* 0.99 0.98  --  0.96  CALCIUM 8.6* 8.8* 8.2*  --  8.6*  MG  --   --   --  1.8  --    GFR: Estimated Creatinine Clearance: 66.1 mL/min (by C-G formula based on SCr of 0.96 mg/dL). Liver Function Tests: Recent Labs  Lab 01/21/20 1231  AST 21  ALT 29  ALKPHOS 108  BILITOT 0.5  PROT 7.5  ALBUMIN 3.0*   Recent Labs  Lab 01/21/20 1231  LIPASE 28   No results for input(s): AMMONIA in the last 168 hours. Coagulation Profile: No results for input(s): INR, PROTIME in the last 168 hours. Cardiac Enzymes: No results for input(s): CKTOTAL, CKMB, CKMBINDEX, TROPONINI in the last 168 hours. BNP (last 3 results) No results for input(s): PROBNP in the last 8760 hours. HbA1C: No results for input(s): HGBA1C in the last 72 hours. CBG: Recent Labs  Lab 01/23/20 1708 01/23/20 2026 01/23/20 2310 01/24/20 0501 01/24/20 0751  GLUCAP 274* 319* 152* 135* 162*   Lipid Profile: No results for input(s): CHOL, HDL, LDLCALC, TRIG, CHOLHDL, LDLDIRECT in the last 72 hours. Thyroid Function  Tests: No results for input(s): TSH, T4TOTAL, FREET4, T3FREE, THYROIDAB in the last 72 hours. Anemia Panel: No results for input(s): VITAMINB12, FOLATE, FERRITIN, TIBC, IRON, RETICCTPCT in the last 72 hours. Sepsis Labs: Recent Labs  Lab 01/21/20 1231 01/21/20 1555  LATICACIDVEN 1.2 1.5    Recent Results (from the past 240 hour(s))  Respiratory Panel by RT PCR (Flu A&B, Covid) - Nasopharyngeal Swab     Status: None   Collection Time: 01/21/20  3:55 PM   Specimen: Nasopharyngeal Swab  Result Value Ref Range Status   SARS Coronavirus 2 by RT PCR NEGATIVE NEGATIVE Final    Comment: (NOTE) SARS-CoV-2 target nucleic acids are NOT DETECTED. The SARS-CoV-2 RNA is generally detectable in upper respiratoy specimens during the acute phase of infection. The lowest concentration of SARS-CoV-2 viral copies this assay can detect is 131 copies/mL. A negative result does not preclude SARS-Cov-2 infection and should not be used as the sole basis for treatment or other patient management decisions. A negative result may occur with  improper specimen collection/handling, submission of specimen other than nasopharyngeal swab, presence of viral mutation(s) within the areas targeted by this assay, and inadequate number of viral copies (<131 copies/mL). A negative result must be combined with clinical observations, patient history, and epidemiological information. The expected result is Negative. Fact Sheet for Patients:  PinkCheek.be Fact Sheet for Healthcare Providers:  GravelBags.it This test is not yet ap proved or cleared by the [place] FDA and  has been authorized for detection and/or diagnosis of SARS-CoV-2 by FDA under an Emergency Use Authorization (EUA). This EUA will remain  in effect (meaning this test can be used) for the duration of the COVID-19 declaration under Section 564(b)(1) of the Act, 21 U.S.C. section  360bbb-3(b)(1), unless the authorization is terminated or revoked sooner.    Influenza A by PCR NEGATIVE NEGATIVE Final   Influenza B by PCR NEGATIVE NEGATIVE Final    Comment: (NOTE) The Xpert Xpress SARS-CoV-2/FLU/RSV assay is intended as an aid in  the diagnosis of influenza from Nasopharyngeal swab specimens and  should not be used as a sole basis for treatment. Nasal washings and  aspirates are unacceptable for Xpert Xpress SARS-CoV-2/FLU/RSV  testing. Fact Sheet for Patients: PinkCheek.be Fact Sheet for Healthcare Providers: GravelBags.it This test is not yet approved or cleared by the [place] FDA and  has been authorized for detection and/or diagnosis of SARS-CoV-2 by  FDA under an Emergency Use Authorization (EUA). This EUA will remain  in effect (meaning this test can be used) for the duration of the  Covid-19 declaration under Section 564(b)(1) of the Act, 21  U.S.C. section 360bbb-3(b)(1), unless the authorization is  terminated or revoked. Performed at [facility], [address] [place], [place] [address]   Culture, blood (Routine X 2) w Reflex to ID Panel  Status: None (Preliminary result)   Collection Time: 01/21/20  4:54 PM   Specimen: BLOOD  Result Value Ref Range Status   Specimen Description BLOOD BLOOD RIGHT ARM  Final   Special Requests   Final    BOTTLES DRAWN AEROBIC AND ANAEROBIC Blood Culture adequate volume   Culture   Final    NO GROWTH 3 DAYS Performed at [facility], [address] [place], [place] [address]    Report Status PENDING  Incomplete  Culture, blood (Routine X 2) w Reflex to ID Panel     Status: None (Preliminary result)   Collection Time: 01/21/20  4:54 PM   Specimen: BLOOD  Result Value Ref Range Status   Specimen Description BLOOD BLOOD RIGHT ARM  Final   Special Requests   Final    BOTTLES DRAWN AEROBIC AND ANAEROBIC Blood Culture adequate  volume   Culture   Final    NO GROWTH 3 DAYS Performed at [facility], [address] [place], [place] [address]    Report Status PENDING  Incomplete  Gastrointestinal Panel by PCR , Stool     Status: None   Collection Time: 01/21/20 10:00 PM   Specimen: Stool  Result Value Ref Range Status   Campylobacter species NOT DETECTED NOT DETECTED Final   Plesimonas shigelloides NOT DETECTED NOT DETECTED Final   Salmonella species NOT DETECTED NOT DETECTED Final   Yersinia enterocolitica NOT DETECTED NOT DETECTED Final   Vibrio species NOT DETECTED NOT DETECTED Final   Vibrio cholerae NOT DETECTED NOT DETECTED Final   Enteroaggregative E coli (EAEC) NOT DETECTED NOT DETECTED Final   Enteropathogenic E coli (EPEC) NOT DETECTED NOT DETECTED Final   Enterotoxigenic E coli (ETEC) NOT DETECTED NOT DETECTED Final   Shiga like toxin producing E coli (STEC) NOT DETECTED NOT DETECTED Final   Shigella/Enteroinvasive E coli (EIEC) NOT DETECTED NOT DETECTED Final   Cryptosporidium NOT DETECTED NOT DETECTED Final   Cyclospora cayetanensis NOT DETECTED NOT DETECTED Final   Entamoeba histolytica NOT DETECTED NOT DETECTED Final   Giardia lamblia NOT DETECTED NOT DETECTED Final   Adenovirus F40/41 NOT DETECTED NOT DETECTED Final   Astrovirus NOT DETECTED NOT DETECTED Final   Norovirus GI/GII NOT DETECTED NOT DETECTED Final   Rotavirus A NOT DETECTED NOT DETECTED Final   Sapovirus (I, II, IV, and V) NOT DETECTED NOT DETECTED Final    Comment: Performed at [facility], [address] [place], [place] [address]  C Difficile Quick Screen w PCR reflex     Status: None   Collection Time: 01/21/20 10:00 PM   Specimen: STOOL  Result Value Ref Range Status   C Diff antigen NEGATIVE NEGATIVE Final   C Diff toxin NEGATIVE NEGATIVE Final   C Diff interpretation No C. difficile detected.  Final    Comment: Performed at [facility], [address] [place], [place] [address]          Radiology Studies: No results found.      Scheduled Meds: . acidophilus  1 capsule Oral TID  . aspirin EC  81 mg Oral Daily  . enoxaparin (LOVENOX) injection  40 mg Subcutaneous Q24H  . FLUoxetine  10 mg Oral Daily  . insulin aspart  0-9 Units Subcutaneous Q4H  . insulin glargine  10 Units Subcutaneous QHS  . multivitamin with minerals  1 tablet Oral Daily  . nicotine  21 mg Transdermal Daily  . Ensure Max Protein  11 oz Oral BID  BM  . sucralfate  1 g Oral TID WC & HS   Continuous Infusions: . sodium chloride 100 mL/hr at 01/23/20 2035  . sodium chloride      Assessment & Plan:   Principal Problem:   Diarrhea Active Problems:   GERD (gastroesophageal reflux disease)   AKI (acute kidney injury) ([place])   Hypotension   Anemia of chronic disease   Protein-calorie malnutrition, severe   Tobacco abuse   C. difficile colitis   Urinary retention   Diabetes mellitus without complication (HCC)   Diarrhea: -Monitoring closely -nonbilious nonbloody Recently hospitalized from 3/18-3/25 due to DKA, esophageal candidiasis and C. difficile colitis. Patient was treated with fluconazole and oral vancomycin for 10 days.  Repeat stool study for C. difficile was negative toxin was negative,  -GI pathogen were all negative -Patient was initiated on admission on p.o. vancomycin and ciprofloxacin both were DC'd -Patient does not have any fever,chills, negative sign of sepsis exception of hypertension -All antibiotics will be DC'd for now -We will monitor closely -Lactobacillus added to the regimen ck Hepatitis panel, HIV screening, CRP,  ESR GI input was appreciated-we will start Imodium, colonoscopy Friday  Recent history of C. difficile colitis --Repeat stool studies with toxins antigen negative -Withholding p.o. vancomycin  GERD (gastroesophageal reflux disease): -Protonix --will be DC'd as it would increase his chance of recurrent C. difficile  AKI (acute kidney  injury) ([place]):  Likely due to dehydration, resolved. -IV fluid as above -Follow-up renal function by BMP  Hypotension:  Likely due to dehydration. Patient does NOT have fever or leukocytosis, and lactic acid is normal. Does not seem to have sepsis -IV fluid as above  Diabetes mellitus without complication: Most recent A1c15.0, poorly controled.  Patient is takingNovoLog and Lantusat home -10 units nightly -Diabetic coordinator consulted,  -CBG QA CHS, SSI   Anemia of chronic disease: Hemoglobin 8.7 down from recent 10.0. No active bleeding -Follow-up with CBC  Protein-calorie malnutrition, severe -Start Ensure  Tobacco abuse -Nicotine patch  Urinary retention: -In and out Foley catheter was done       DVT prophylaxis: Lovenox subcu Code Status: Full Family Communication: None at bedside Disposition Plan: Back home Barrier: plan for cscope on Friday.     LOS: 3 days   Time spent: 45 min with more than 50% COC    Nolberto Hanlon, MD [facility] Pager [phone]  If 7PM-7AM, please contact night-coverage [email or web address] Password [facility] 01/24/2020, 8:30 AM Patient ID: Abass Misener, male   DOB: Jun 13, 1976, 44 y.o.   MRN: [id number]

## 2020-01-24 NOTE — Progress Notes (Signed)
GI Inpatient Follow-up Note  Subjective:  Patient seen in follow-up for intractable diarrhea. He reports he continues to have diarrhea without any frank hematochezia or melena. He has been >6 times since last night. No nausea, vomiting, abdominal pain, fever. He is not wanting clear liquid diet. Diabetes coordinator saw pt today.   Scheduled Inpatient Medications:  . acidophilus  1 capsule Oral TID  . aspirin EC  81 mg Oral Daily  . enoxaparin (LOVENOX) injection  40 mg Subcutaneous Q24H  . FLUoxetine  10 mg Oral Daily  . insulin aspart  0-9 Units Subcutaneous Q4H  . insulin glargine  10 Units Subcutaneous QHS  . multivitamin with minerals  1 tablet Oral Daily  . nicotine  21 mg Transdermal Daily  . polyethylene glycol powder  1 Container Oral Once  . Ensure Max Protein  11 oz Oral BID BM  . sucralfate  1 g Oral TID WC & HS    Continuous Inpatient Infusions:   . sodium chloride 100 mL/hr at 01/23/20 2035  . sodium chloride      PRN Inpatient Medications:  acetaminophen, loperamide, ondansetron (ZOFRAN) IV  Review of Systems: Constitutional: Weight is stable.  Eyes: No changes in vision. ENT: No oral lesions, sore throat.  GI: see HPI.  Heme/Lymph: No easy bruising.  CV: No chest pain.  GU: No hematuria.  Integumentary: No rashes.  Neuro: No headaches.  Psych: No depression/anxiety.  Endocrine: No heat/cold intolerance.  Allergic/Immunologic: No urticaria.  Resp: No cough, SOB.  Musculoskeletal: No joint swelling.    Physical Examination: BP 112/75 (BP Location: Left Arm)   Pulse 79   Temp 98.2 F (36.8 C)   Resp 18   Ht '5\' 7"'  (1.702 m)   Wt 47.6 kg   SpO2 100%   BMI 16.44 kg/m  Gen: NAD, alert and oriented x 4 HEENT: PEERLA, EOMI, Neck: supple, no JVD or thyromegaly Chest: CTA bilaterally, no wheezes, crackles, or other adventitious sounds CV: RRR, no m/g/c/r Abd: soft, NT, ND, +BS in all four quadrants; no HSM, guarding, ridigity, or rebound  tenderness Ext: no edema, well perfused with 2+ pulses, Skin: no rash or lesions noted Lymph: no LAD  Data: Lab Results  Component Value Date   WBC 5.4 01/24/2020   HGB 8.8 (L) 01/24/2020   HCT 25.6 (L) 01/24/2020   MCV 90.1 01/24/2020   PLT 407 (H) 01/24/2020   Recent Labs  Lab 01/22/20 0433 01/23/20 0530 01/24/20 0622  HGB 8.8* 7.6* 8.8*   Lab Results  Component Value Date   NA 135 01/24/2020   K 4.3 01/24/2020   CL 111 01/24/2020   CO2 18 (L) 01/24/2020   BUN 19 01/24/2020   CREATININE 0.96 01/24/2020   Lab Results  Component Value Date   ALT 29 01/21/2020   AST 21 01/21/2020   ALKPHOS 108 01/21/2020   BILITOT 0.5 01/21/2020   No results for input(s): APTT, INR, PTT in the last 168 hours. Assessment/Plan:  44 y/o [name] with a PMH of GERD, uncontrolled T2DM with gastroparesis, Hx of DKA, tobacco abuse, COVID-19 infection 04/2019, nutritional anemia, and failure to thrive admitted for intractable diarrhea  1. Acute on chronic diarrhea 2. Hx of c diff colitis - treated with 10-day course of vancomycin last month. Repeat C diff testing on 4/12 was negative.  3. Nutritional anemia - no signs of overt GI blood loss 4. Abnormal CT of rectum - proctitis  -Continue with plan of care with colonoscopy tomorrow  with Dr. Alice Reichert with biopsies for further evaluation of intractable diarrhea. Infectious studies negative. ESR and CRP are elevated. -Clear liquid diet all day today -NPO after midnight.  -Bowel prep around 1700 today -Colonoscopy tomorrow -See procedure note for findings and further recommendations   Please call with questions or concerns.    Octavia Bruckner, PA-C [facility] [phone] (Cell)

## 2020-01-25 ENCOUNTER — Encounter: Payer: Self-pay | Admitting: Internal Medicine

## 2020-01-25 ENCOUNTER — Inpatient Hospital Stay: Payer: Self-pay | Admitting: Anesthesiology

## 2020-01-25 ENCOUNTER — Other Ambulatory Visit: Payer: Self-pay

## 2020-01-25 ENCOUNTER — Encounter
Admission: EM | Disposition: A | Discharge status: Home Health | Payer: Self-pay | Source: Home / Self Care | Attending: Internal Medicine

## 2020-01-25 HISTORY — PX: COLONOSCOPY: SHX5424

## 2020-01-25 LAB — BASIC METABOLIC PANEL WITH GFR
Anion gap: 4 — ABNORMAL LOW (ref 5–15)
BUN: 12 mg/dL (ref 6–20)
CO2: 20 mmol/L — ABNORMAL LOW (ref 22–32)
Calcium: 8.7 mg/dL — ABNORMAL LOW (ref 8.9–10.3)
Chloride: 111 mmol/L (ref 98–111)
Creatinine, Ser: 0.88 mg/dL (ref 0.61–1.24)
GFR calc Af Amer: >60 mL/min
GFR calc non Af Amer: >60 mL/min
Glucose, Bld: 120 mg/dL — ABNORMAL HIGH (ref 70–99)
Potassium: 4.7 mmol/L (ref 3.5–5.1)
Sodium: 135 mmol/L (ref 135–145)

## 2020-01-25 LAB — CBC
HCT: 24.6 % — ABNORMAL LOW (ref 39.0–52.0)
Hemoglobin: 8.3 g/dL — ABNORMAL LOW (ref 13.0–17.0)
MCH: 30.9 pg (ref 26.0–34.0)
MCHC: 33.7 g/dL (ref 30.0–36.0)
MCV: 91.4 fL (ref 80.0–100.0)
Platelets: 407 10*3/uL — ABNORMAL HIGH (ref 150–400)
RBC: 2.69 MIL/uL — ABNORMAL LOW (ref 4.22–5.81)
RDW: 13.5 % (ref 11.5–15.5)
WBC: 4.8 10*3/uL (ref 4.0–10.5)
nRBC: 0 % (ref 0.0–0.2)

## 2020-01-25 LAB — GLUCOSE, CAPILLARY
Glucose-Capillary: 101 mg/dL — ABNORMAL HIGH (ref 70–99)
Glucose-Capillary: 116 mg/dL — ABNORMAL HIGH (ref 70–99)
Glucose-Capillary: 88 mg/dL (ref 70–99)
Glucose-Capillary: 84 mg/dL (ref 70–99)
Glucose-Capillary: 227 mg/dL — ABNORMAL HIGH (ref 70–99)
Glucose-Capillary: 296 mg/dL — ABNORMAL HIGH (ref 70–99)

## 2020-01-25 SURGERY — COLONOSCOPY
Anesthesia: General

## 2020-01-25 MED ORDER — PROPOFOL 10 MG/ML IV BOLUS
INTRAVENOUS | Status: DC | PRN
  Administered 2020-01-25: 10 mg via INTRAVENOUS
  Administered 2020-01-25 (×2): 20 mg via INTRAVENOUS

## 2020-01-25 MED ORDER — SODIUM CHLORIDE 0.9 % IV SOLN: INTRAVENOUS | Status: DC

## 2020-01-25 MED ORDER — LIDOCAINE HCL (CARDIAC) PF 100 MG/5ML IV SOSY
PREFILLED_SYRINGE | INTRAVENOUS | Status: DC | PRN
  Administered 2020-01-25: 100 mg via INTRATRACHEAL

## 2020-01-25 MED ORDER — PHENYLEPHRINE HCL (PRESSORS) 10 MG/ML IV SOLN
INTRAVENOUS | Status: DC | PRN
  Administered 2020-01-25: 100 ug via INTRAVENOUS

## 2020-01-25 MED ORDER — PROPOFOL 500 MG/50ML IV EMUL
INTRAVENOUS | Status: DC | PRN
  Administered 2020-01-25: 140 ug/kg/min via INTRAVENOUS

## 2020-01-25 NOTE — Interval H&P Note (Signed)
History and Physical Interval Note:  01/25/2020 1:04 PM  Gregory Moody  has presented today for surgery, with the diagnosis of Acute on chronic diarrhea, proctitis.  The various methods of treatment have been discussed with the patient and family. After consideration of risks, benefits and other options for treatment, the patient has consented to  Procedure(s): COLONOSCOPY (N/A) as a surgical intervention.  The patient's history has been reviewed, patient examined, no change in status, stable for surgery.  I have reviewed the patient's chart and labs.  Questions were answered to the patient's satisfaction.     [place], [place]

## 2020-01-25 NOTE — Progress Notes (Signed)
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 difficile colitis,who presents with diarrhea.  Repeated stool negative for C. difficile toxin antigen, gastric panel negative also. Is very cachectic, with generalized weaknesses  Recently hospitalized from 3/18-3/25 due to DKA, esophageal candidiasis and C. difficile colitis. Patient was treated with fluconazole and oral vancomycin for 10 days.  On admission he was hypotensive with a blood pressure of 85/65 improved with 2 L NS IV fluid to 122/91,    Consultants:   GI  Procedures:    Antimicrobials: Antibiotics for 01/21/2020 p.o. vancomycin, ciprofloxacin till 01/22/2020    Subjective: Diarrhea slowed down with Imodium, but still having some. cscope this am.  Objective: Vitals:   01/25/20 1415 01/25/20 1435 01/25/20 1445 01/25/20 1516  BP:  91/61 110/67 93/67  Pulse:    67  Resp:    19  Temp: (!) 96.7 F (35.9 C)   (!) 97.4 F (36.3 C)  TempSrc: Temporal   Oral  SpO2:    100%  Weight:      Height:        Intake/Output Summary (Last 24 hours) at 01/25/2020 1557 Last data filed at 01/25/2020 1410 Gross per 24 hour  Intake 920 ml  Output 1150 ml  Net -230 ml   Filed Weights   01/24/20 0912 01/25/20 0420 01/25/20 1232  Weight: 46.5 kg 48.6 kg 48.6 kg    Examination:  General exam: Pt seen early this am.Appears calm and comfortable, nad Respiratory system: Clear to auscultation. Respiratory effort normal.  No wheeze ,rales, rhonchi  cardiovascular system: S1 & S2 heard, RRR. No JVD, murmurs, rubs, gallops or clicks.  Gastrointestinal system: Abdomen is nondistended, soft and  nontender.  Normal bowel sounds heard.   Central nervous system: Alert and oriented.  Grossly intact Extremities: No edema Skin: Warm dry Psychiatry Mood & affect appropriate.     Data Reviewed: I have personally reviewed following labs and imaging studies  CBC: Recent Labs  Lab 01/21/20 1231 01/22/20 0433 01/23/20 0530 01/24/20 0622 01/25/20 0613  WBC 6.4 5.7 5.4 5.4 4.8  NEUTROABS 4.8  --   --   --   --   HGB 8.7* 8.8* 7.6* 8.8* 8.3*  HCT 26.9* 27.0* 23.0* 25.6* 24.6*  MCV 94.1 93.1 93.1 90.1 91.4  PLT 420* 393 374 407* 323*   Basic Metabolic Panel: Recent Labs  Lab 01/21/20 1231 01/22/20 0433 01/23/20 0530 01/23/20 0556 01/24/20 0622 01/25/20 0613  NA 127* 131* 135  --  135 135  K 4.6 4.4 3.9  --  4.3 4.7  CL 98 104 113*  --  111 111  CO2 21* 21* 17*  --  18* 20*  GLUCOSE 448* 201* 101*  --  164* 120*  BUN 22* 20 16  --  19 12  CREATININE 1.45* 0.99 0.98  --  0.96 0.88  CALCIUM 8.6* 8.8* 8.2*  --  8.6* 8.7*  MG  --   --   --  1.8  --   --    GFR: Estimated Creatinine Clearance: 73.6 mL/min (by C-G formula based on SCr of 0.88 mg/dL). Liver Function Tests: Recent Labs  Lab 01/21/20 1231  AST 21  ALT 29  ALKPHOS 108  BILITOT 0.5  PROT 7.5  ALBUMIN 3.0*   Recent Labs  Lab 01/21/20 1231  LIPASE 28   No results for input(s): AMMONIA in the last 168 hours. Coagulation Profile: No results for input(s): INR, PROTIME in the last 168 hours. Cardiac Enzymes: No results for input(s): CKTOTAL, CKMB, CKMBINDEX, TROPONINI in the last 168 hours. BNP (last 3 results) No results for input(s): PROBNP in the last 8760 hours. HbA1C: No results for input(s): HGBA1C in the last 72 hours. CBG: Recent Labs  Lab 01/24/20 2342 01/25/20 0417 01/25/20 0836 01/25/20 1202 01/25/20 1247  GLUCAP 168* 101* 116* 88 84   Lipid Profile: No results for input(s): CHOL, HDL, LDLCALC, TRIG, CHOLHDL, LDLDIRECT in the last 72 hours. Thyroid Function Tests: No results for  input(s): TSH, T4TOTAL, FREET4, T3FREE, THYROIDAB in the last 72 hours. Anemia Panel: No results for input(s): VITAMINB12, FOLATE, FERRITIN, TIBC, IRON, RETICCTPCT in the last 72 hours. Sepsis Labs: Recent Labs  Lab 01/21/20 1231 01/21/20 1555  LATICACIDVEN 1.2 1.5    Recent Results (from the past 240 hour(s))  Respiratory Panel by RT PCR (Flu A&B, Covid) - Nasopharyngeal Swab     Status: None   Collection Time: 01/21/20  3:55 PM   Specimen: Nasopharyngeal Swab  Result Value Ref Range Status   SARS Coronavirus 2 by RT PCR NEGATIVE NEGATIVE Final    Comment: (NOTE) SARS-CoV-2 target nucleic acids are NOT DETECTED. The SARS-CoV-2 RNA is generally detectable in upper respiratoy specimens during the acute phase of infection. The lowest concentration of SARS-CoV-2 viral copies this assay can detect is 131 copies/mL. A negative result does not preclude SARS-Cov-2 infection and should not be used as the sole basis for treatment or other patient management decisions. A negative result may occur with  improper specimen collection/handling, submission of specimen other than nasopharyngeal swab, presence of viral mutation(s) within the areas targeted by this assay, and inadequate number of viral copies (<131 copies/mL). A negative result must be combined with clinical observations, patient history, and epidemiological information. The expected result is Negative. Fact Sheet for Patients:  PinkCheek.be Fact Sheet for Healthcare Providers:  GravelBags.it This test is not yet ap proved or cleared by the [place] FDA and  has been authorized for detection and/or diagnosis of SARS-CoV-2 by FDA under an Emergency Use Authorization (EUA). This EUA will remain  in effect (meaning this test can be used) for the duration of the COVID-19 declaration under Section 564(b)(1) of the Act, 21 U.S.C. section 360bbb-3(b)(1), unless the  authorization is terminated or revoked sooner.    Influenza A by PCR NEGATIVE NEGATIVE Final   Influenza B by PCR NEGATIVE NEGATIVE Final    Comment: (NOTE) The Xpert Xpress SARS-CoV-2/FLU/RSV assay is intended as an aid in  the diagnosis of influenza from Nasopharyngeal swab specimens and  should not be used as a sole basis for treatment. Nasal washings and  aspirates are unacceptable for Xpert Xpress SARS-CoV-2/FLU/RSV  testing. Fact Sheet for Patients: PinkCheek.be Fact Sheet for Healthcare Providers: GravelBags.it This test is not yet approved or cleared by the [place] FDA and  has been authorized for detection and/or diagnosis of SARS-CoV-2 by  FDA under an Emergency Use Authorization (EUA). This EUA will remain  in effect (meaning this test can be used) for the duration of the  Covid-19 declaration under Section 564(b)(1) of the Act, 21  U.S.C. section 360bbb-3(b)(1), unless the authorization is  terminated or revoked. Performed at [facility], [address],  [place], [place] [address]   Culture, blood (Routine X 2) w Reflex to ID Panel     Status: None (Preliminary result)   Collection Time: 01/21/20  4:54 PM   Specimen: BLOOD  Result Value Ref Range Status   Specimen Description BLOOD BLOOD RIGHT ARM  Final   Special Requests   Final    BOTTLES DRAWN AEROBIC AND ANAEROBIC Blood Culture adequate volume   Culture   Final    NO GROWTH 4 DAYS Performed at [facility], [address], [place], [place] [address]    Report Status PENDING  Incomplete  Culture, blood (Routine X 2) w Reflex to ID Panel     Status: None (Preliminary result)   Collection Time: 01/21/20  4:54 PM   Specimen: BLOOD  Result Value Ref Range Status   Specimen Description BLOOD BLOOD RIGHT ARM  Final   Special Requests   Final    BOTTLES DRAWN AEROBIC AND ANAEROBIC Blood Culture adequate volume   Culture   Final     NO GROWTH 4 DAYS Performed at [facility], [address] [place], [place] [address]    Report Status PENDING  Incomplete  Gastrointestinal Panel by PCR , Stool     Status: None   Collection Time: 01/21/20 10:00 PM   Specimen: Stool  Result Value Ref Range Status   Campylobacter species NOT DETECTED NOT DETECTED Final   Plesimonas shigelloides NOT DETECTED NOT DETECTED Final   Salmonella species NOT DETECTED NOT DETECTED Final   Yersinia enterocolitica NOT DETECTED NOT DETECTED Final   Vibrio species NOT DETECTED NOT DETECTED Final   Vibrio cholerae NOT DETECTED NOT DETECTED Final   Enteroaggregative E coli (EAEC) NOT DETECTED NOT DETECTED Final   Enteropathogenic E coli (EPEC) NOT DETECTED NOT DETECTED Final   Enterotoxigenic E coli (ETEC) NOT DETECTED NOT DETECTED Final   Shiga like toxin producing E coli (STEC) NOT DETECTED NOT DETECTED Final   Shigella/Enteroinvasive E coli (EIEC) NOT DETECTED NOT DETECTED Final   Cryptosporidium NOT DETECTED NOT DETECTED Final   Cyclospora cayetanensis NOT DETECTED NOT DETECTED Final   Entamoeba histolytica NOT DETECTED NOT DETECTED Final   Giardia lamblia NOT DETECTED NOT DETECTED Final   Adenovirus F40/41 NOT DETECTED NOT DETECTED Final   Astrovirus NOT DETECTED NOT DETECTED Final   Norovirus GI/GII NOT DETECTED NOT DETECTED Final   Rotavirus A NOT DETECTED NOT DETECTED Final   Sapovirus (I, II, IV, and V) NOT DETECTED NOT DETECTED Final    Comment: Performed at [facility], [address] [place], [place] [address]  C Difficile Quick Screen w PCR reflex     Status: None   Collection Time: 01/21/20 10:00 PM   Specimen: STOOL  Result Value Ref Range Status   C Diff antigen NEGATIVE NEGATIVE Final   C Diff toxin NEGATIVE NEGATIVE Final   C Diff interpretation No C. difficile detected.  Final    Comment: Performed at [facility], [address] [place], [place] [address]         Radiology Studies: No  results found.      Scheduled Meds: . acidophilus  1 capsule Oral TID  . aspirin EC  81 mg Oral Daily  . enoxaparin (LOVENOX) injection  40 mg Subcutaneous Q24H  . FLUoxetine  10 mg Oral Daily  . insulin aspart  0-9 Units Subcutaneous Q4H  . insulin glargine  10 Units Subcutaneous QHS  . multivitamin with minerals  1 tablet Oral  Daily  . nicotine  21 mg Transdermal Daily  . Ensure Max Protein  11 oz Oral BID BM  . sucralfate  1 g Oral TID WC & HS   Continuous Infusions: . sodium chloride Stopped (01/25/20 1410)  . sodium chloride      Assessment & Plan:   Principal Problem:   Diarrhea Active Problems:   GERD (gastroesophageal reflux disease)   AKI (acute kidney injury) ([place])   Hypotension   Anemia of chronic disease   Protein-calorie malnutrition, severe   Tobacco abuse   C. difficile colitis   Urinary retention   Diabetes mellitus without complication (HCC)   Diarrhea: -Monitoring closely -nonbilious nonbloody Recently hospitalized from 3/18-3/25 due to DKA, esophageal candidiasis and C. difficile colitis. Patient was treated with fluconazole and oral vancomycin for 10 days.  Repeat stool study for C. difficile was negative toxin was negative,  -GI pathogen were all negative -Patient was initiated on admission on p.o. vancomycin and ciprofloxacin both were DC'd -Patient does not have any fever,chills, negative sign of sepsis exception of hypertension -All antibiotics will be DC'd for now -We will monitor closely -Lactobacillus added to the regimen ck Hepatitis panel, HIV screening, CRP,  ESR GI input was appreciated-we will start Imodium cscope- nml colon. bx done.   Recent history of C. difficile colitis --Repeat stool studies with toxins antigen negative -Withholding p.o. vancomycin  GERD (gastroesophageal reflux disease): -Protonix --will be DC'd as it would increase his chance of recurrent C. difficile  AKI (acute kidney injury) ([place]):  Likely  due to dehydration, resolved. -IV fluid as above -Follow-up renal function by BMP  Hypotension:  Likely due to dehydration. Patient does NOT have fever or leukocytosis, and lactic acid is normal. Does not seem to have sepsis -IV fluid as above  Diabetes mellitus without complication: Most recent A1c15.0, poorly controled.  Patient is takingNovoLog and Lantusat home -10 units nightly -Diabetic coordinator consulted,  -CBG QA CHS, SSI   Anemia of chronic disease: Hemoglobin 8.7 down from recent 10.0. No active bleeding -Follow-up with CBC  Protein-calorie malnutrition, severe -Start Ensure  Tobacco abuse -Nicotine patch  Urinary retention: -In and out Foley catheter was done       DVT prophylaxis: Lovenox subcu Code Status: Full Family Communication: None at bedside Disposition Plan: Back home Barrier: cscope today done. Still with diarrhea, needs improvement of sx before d/c . Possible dc in 1-2 days     LOS: 4 days   Time spent: 45 min with more than 50% COC    Nolberto Hanlon, MD [facility] Pager [phone]  If 7PM-7AM, please contact night-coverage [email or web address] Password [facility] 01/25/2020, 3:57 PM Patient ID: Gregory Moody, male   DOB: October 06, 1976, 44 y.o.   MRN: [id number] Patient ID: Gregory Moody, male   DOB: 1976-08-04, 44 y.o.   MRN: [id number]

## 2020-01-25 NOTE — Anesthesia Preprocedure Evaluation (Addendum)
Anesthesia Evaluation  Patient identified by MRN, date of birth, ID band Patient awake    Reviewed: Allergy & Precautions, H&P , NPO status , reviewed documented beta blocker date and time   Airway Mallampati: II  TM Distance: >3 FB Neck ROM: full    Dental  (+) Chipped, Missing, Poor Dentition, Loose, Dental Advidsory Given Extremely poor dentition, pt reports mild loose tooth R upper back. No obvious loosening on exam:   Pulmonary pneumonia, resolved, Current Smoker and Patient abstained from smoking.,    Pulmonary exam normal        Cardiovascular Normal cardiovascular exam     Neuro/Psych PSYCHIATRIC DISORDERS Depression    GI/Hepatic GERD  Medicated and Controlled,  Endo/Other  diabetes  Renal/GU Renal disease     Musculoskeletal   Abdominal   Peds  Hematology  (+) Blood dyscrasia, anemia ,   Anesthesia Other Findings Past Medical History: No date: COVID-19 No date: Diabetes mellitus without complication ([place]) No date: Gastroparesis No date: Tuberculosis  Past Surgical History: 02/03/2019: ESOPHAGOGASTRODUODENOSCOPY; N/A     Comment:  Procedure: ESOPHAGOGASTRODUODENOSCOPY (EGD);  Surgeon:               T[place], Benay Pike, MD;  Location: [facility] ENDOSCOPY;                Service: Gastroenterology;  Laterality: N/A;  BMI    Body Mass Index: 16.79 kg/m      Reproductive/Obstetrics                            Anesthesia Physical Anesthesia Plan  ASA: III  Anesthesia Plan: General   Post-op Pain Management:    Induction: Intravenous  PONV Risk Score and Plan: 1 and Treatment may vary due to age or medical condition, TIVA and Propofol infusion  Airway Management Planned: Nasal Cannula and Natural Airway  Additional Equipment:   Intra-op Plan:   Post-operative Plan:   Informed Consent: I have reviewed the patients History and Physical, chart, labs and discussed the procedure  including the risks, benefits and alternatives for the proposed anesthesia with the patient or authorized representative who has indicated his/her understanding and acceptance.     Dental Advisory Given  Plan Discussed with: CRNA  Anesthesia Plan Comments:        Anesthesia Quick Evaluation

## 2020-01-25 NOTE — Progress Notes (Signed)
Pt refused bed alarm but was educated about safety.. Will continue to monitor. 

## 2020-01-25 NOTE — Plan of Care (Signed)
  Problem: Safety: Goal: Ability to remain free from injury will improve Outcome: Progressing   

## 2020-01-25 NOTE — Anesthesia Postprocedure Evaluation (Signed)
Anesthesia Post Note  Patient: Gregory Moody  Procedure(s) Performed: COLONOSCOPY (N/A )  Patient location during evaluation: Endoscopy Anesthesia Type: General Level of consciousness: awake and alert Pain management: pain level controlled Vital Signs Assessment: post-procedure vital signs reviewed and stable Respiratory status: spontaneous breathing, nonlabored ventilation and respiratory function stable Cardiovascular status: blood pressure returned to baseline and stable Postop Assessment: no apparent nausea or vomiting Anesthetic complications: no     Last Vitals:  Vitals:   01/25/20 1435 01/25/20 1445  BP: 91/61 110/67  Pulse:    Resp:    Temp:    SpO2:      Last Pain:  Vitals:   01/25/20 1445  TempSrc:   PainSc: 0-No pain                 Alphonsus Sias

## 2020-01-25 NOTE — Transfer of Care (Signed)
Immediate Anesthesia Transfer of Care Note  Patient: Gregory Moody  Procedure(s) Performed: COLONOSCOPY (N/A )  Patient Location: Endoscopy Unit  Anesthesia Type:General  Level of Consciousness: awake and patient cooperative  Airway & Oxygen Therapy: Patient Spontanous Breathing and Patient connected to face mask oxygen  Post-op Assessment: Report given to RN and Post -op Vital signs reviewed and stable  Post vital signs: Reviewed and stable  Last Vitals:  Vitals Value Taken Time  BP 89/60 01/25/20 1415  Temp 35.9 C 01/25/20 1415  Pulse 61 01/25/20 1417  Resp 13 01/25/20 1417  SpO2 100 % 01/25/20 1417  Vitals shown include unvalidated device data.  Last Pain:  Vitals:   01/25/20 1415  TempSrc: Temporal  PainSc: Asleep      Patients Stated Pain Goal: 0 (24/58/09 9833)  Complications: No apparent anesthesia complications

## 2020-01-25 NOTE — Op Note (Signed)
[facility] Gastroenterology Patient Name: Gregory Moody Procedure Date: 01/25/2020 1:47 PM MRN: [id number] Account #: [id number] Date of Birth: 01-16-1976 Admit Type: Outpatient Age: 44 Room: [facility] ENDO ROOM 1 Gender: Male Note Status: Finalized Procedure:             Colonoscopy Indications:           Diarrhea of presumed infectious origin Providers:             Benay Pike. Zyheir Daft MD, MD Medicines:             Propofol per Anesthesia Complications:         No immediate complications. Procedure:             Pre-Anesthesia Assessment:                        - The risks and benefits of the procedure and the                         sedation options and risks were discussed with the                         patient. All questions were answered and informed                         consent was obtained.                        - Patient identification and proposed procedure were                         verified prior to the procedure by the nurse. The                         procedure was verified in the procedure room.                        - ASA Grade Assessment: III - A patient with severe                         systemic disease.                        - After reviewing the risks and benefits, the patient                         was deemed in satisfactory condition to undergo the                         procedure.                        After obtaining informed consent, the colonoscope was                         passed under direct vision. Throughout the procedure,                         the patient's blood pressure, pulse, and oxygen  saturations were monitored continuously. The                         Colonoscope was introduced through the anus and                         advanced to the the terminal ileum, with                         identification of the appendiceal orifice and IC                         valve. The colonoscopy  was performed without                         difficulty. The patient tolerated the procedure well.                         The quality of the bowel preparation was good. The                         terminal ileum, ileocecal valve, appendiceal orifice,                         and rectum were photographed. Findings:      The perianal and digital rectal examinations were normal. Pertinent       negatives include no palpable rectal lesions.      The digital rectal exam findings include decreased sphincter tone.      The terminal ileum appeared normal. Biopsies were taken with a cold       forceps for histology.      The colon (entire examined portion) appeared normal. Biopsies for       histology were taken with a cold forceps from the right colon, left       colon and rectum for evaluation of microscopic colitis.      Non-bleeding internal hemorrhoids were found during retroflexion. The       hemorrhoids were Grade I (internal hemorrhoids that do not prolapse).      The exam was otherwise without abnormality. Impression:            - Decreased sphincter tone found on digital rectal                         exam.                        - The examined portion of the ileum was normal.                         Biopsied.                        - The entire examined colon is normal. Biopsied.                        - Non-bleeding internal hemorrhoids.                        - The examination was otherwise normal. Recommendation:        -  Return patient to hospital ward for ongoing care.                        - Await pathology results.                        - Advance diet as tolerated. Procedure Code(s):     --- Professional ---                        [id number], Colonoscopy, flexible; with biopsy, single or                         multiple Diagnosis Code(s):     --- Professional ---                        R19.7, Diarrhea, unspecified                        K64.0, First degree hemorrhoids                         K62.89, Other specified diseases of anus and rectum CPT copyright 2019 American Medical Association. All rights reserved. The codes documented in this report are preliminary and upon coder review may  be revised to meet current compliance requirements. Efrain Sella MD, MD 01/25/2020 2:14:10 PM This report has been signed electronically. Number of Addenda: 0 Note Initiated On: 01/25/2020 1:47 PM Scope Withdrawal Time: 0 hours 8 minutes 19 seconds  Total Procedure Duration: 0 hours 10 minutes 33 seconds  Estimated Blood Loss:  Estimated blood loss: none.      [facility] [place] [facility]

## 2020-01-25 NOTE — Progress Notes (Signed)
Several closed  sores noted on patients hands bilaterally upon arrival. . Bleeding open sore on left hand, size of a quarter.  On inspection the patient has sores on elbows, finger tips knees and legs. All different sizes and degrees of healing.

## 2020-01-26 LAB — CBC
HCT: 25.5 % — ABNORMAL LOW (ref 39.0–52.0)
Hemoglobin: 8.4 g/dL — ABNORMAL LOW (ref 13.0–17.0)
MCH: 30.8 pg (ref 26.0–34.0)
MCHC: 32.9 g/dL (ref 30.0–36.0)
MCV: 93.4 fL (ref 80.0–100.0)
Platelets: 418 10*3/uL — ABNORMAL HIGH (ref 150–400)
RBC: 2.73 MIL/uL — ABNORMAL LOW (ref 4.22–5.81)
RDW: 13.8 % (ref 11.5–15.5)
WBC: 7.4 10*3/uL (ref 4.0–10.5)
nRBC: 0 % (ref 0.0–0.2)

## 2020-01-26 LAB — CULTURE, BLOOD (ROUTINE X 2)
Culture: NO GROWTH
Culture: NO GROWTH
Special Requests: ADEQUATE
Special Requests: ADEQUATE

## 2020-01-26 LAB — BASIC METABOLIC PANEL
Anion gap: 5 (ref 5–15)
BUN: 11 mg/dL (ref 6–20)
CO2: 21 mmol/L — ABNORMAL LOW (ref 22–32)
Calcium: 8.7 mg/dL — ABNORMAL LOW (ref 8.9–10.3)
Chloride: 105 mmol/L (ref 98–111)
Creatinine, Ser: 0.92 mg/dL (ref 0.61–1.24)
GFR calc Af Amer: >60 mL/min (ref >60)
GFR calc non Af Amer: >60 mL/min (ref >60)
Glucose, Bld: 223 mg/dL — ABNORMAL HIGH (ref 70–99)
Potassium: 4.5 mmol/L (ref 3.5–5.1)
Sodium: 131 mmol/L — ABNORMAL LOW (ref 135–145)

## 2020-01-26 LAB — GLUCOSE, CAPILLARY
Glucose-Capillary: 163 mg/dL — ABNORMAL HIGH (ref 70–99)
Glucose-Capillary: 201 mg/dL — ABNORMAL HIGH (ref 70–99)
Glucose-Capillary: 201 mg/dL — ABNORMAL HIGH (ref 70–99)
Glucose-Capillary: 232 mg/dL — ABNORMAL HIGH (ref 70–99)
Glucose-Capillary: 265 mg/dL — ABNORMAL HIGH (ref 70–99)
Glucose-Capillary: 281 mg/dL — ABNORMAL HIGH (ref 70–99)

## 2020-01-26 MED ORDER — LOPERAMIDE HCL 2 MG PO CAPS
4.0000 mg | ORAL_CAPSULE | Freq: Every morning | ORAL | Status: DC
  Administered 2020-01-26 – 2020-01-29 (×4): 4 mg via ORAL
  Filled 2020-01-26 (×4): qty 2

## 2020-01-26 MED ORDER — SODIUM CHLORIDE 0.9 % IV SOLN: INTRAVENOUS | Status: DC

## 2020-01-26 NOTE — Plan of Care (Signed)
  Problem: Education: Goal: Knowledge of General Education information will improve Description: Including pain rating scale, medication(s)/side effects and non-pharmacologic comfort measures Outcome: Progressing   Problem: Clinical Measurements: Goal: Diagnostic test results will improve Outcome: Progressing   

## 2020-01-26 NOTE — Progress Notes (Signed)
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 difficile colitis,who presents with diarrhea.  Repeated stool negative for C. difficile toxin antigen, gastric panel negative also. Is very cachectic, with generalized weaknesses  Recently hospitalized from 3/18-3/25 due to DKA, esophageal candidiasis and C. difficile colitis. Patient was treated with fluconazole and oral vancomycin for 10 days.  On admission he was hypotensive with a blood pressure of 85/65 improved with 2 L NS IV fluid to 122/91,    Consultants:   GI  Procedures:    Antimicrobials: Antibiotics for 01/21/2020 p.o. vancomycin, ciprofloxacin till 01/22/2020    Subjective: Still with lots of diarrhea, despite prn imodium. No other symptoms  Objective: Vitals:   01/25/20 1516 01/25/20 2011 01/26/20 0529 01/26/20 1124  BP: 93/67 (!) 130/99 (!) 148/89 120/85  Pulse: 67 78  88  Resp: 19   19  Temp: (!) 97.4 F (36.3 C) 97.9 F (36.6 C) 98.8 F (37.1 C) 98.3 F (36.8 C)  TempSrc: Oral Oral Oral   SpO2: 100% 100% 100% 100%  Weight:      Height:        Intake/Output Summary (Last 24 hours) at 01/26/2020 1310 Last data filed at 01/26/2020 0600 Gross per 24 hour  Intake 1400 ml  Output 2 ml  Net 1398 ml   Filed Weights   01/24/20 0912 01/25/20 0420 01/25/20 1232  Weight: 46.5 kg 48.6 kg 48.6 kg    Examination:  General exam: Pt seen early this am.Appears calm and comfortable, nad Respiratory system: Clear to auscultation. Respiratory effort normal.  No wheeze ,rales, rhonchi  cardiovascular system: S1 & S2 heard, RRR. No JVD, murmurs, rubs, gallops or clicks.   Gastrointestinal system: Abdomen is nondistended, soft and nontender.  Normal bowel sounds heard.  no rebound Central nervous system: Alert and oriented.  Grossly intact Extremities: No edema Skin: Warm dry Psychiatry Mood & affect appropriate.     Data Reviewed: I have personally reviewed following labs and imaging studies  CBC: Recent Labs  Lab 01/21/20 1231 01/21/20 1231 01/22/20 0433 01/23/20 0530 01/24/20 0622 01/25/20 0613 01/26/20 0612  WBC 6.4   < > 5.7 5.4 5.4 4.8 7.4  NEUTROABS 4.8  --   --   --   --   --   --   HGB 8.7*   < > 8.8* 7.6* 8.8* 8.3* 8.4*  HCT 26.9*   < > 27.0* 23.0* 25.6* 24.6* 25.5*  MCV 94.1   < > 93.1 93.1 90.1 91.4 93.4  PLT 420*   < > 393 374 407* 407* 418*   < > = values in this interval not displayed.   Basic Metabolic Panel: Recent Labs  Lab 01/22/20 0433 01/23/20 0530 01/23/20 0556 01/24/20 0622 01/25/20 0613 01/26/20 0612  NA 131* 135  --  135 135 131*  K 4.4 3.9  --  4.3 4.7 4.5  CL 104 113*  --  111 111 105  CO2 21* 17*  --  18* 20* 21*  GLUCOSE 201* 101*  --  164* 120* 223*  BUN 20 16  --  19 12 11   CREATININE 0.99 0.98  --  0.96 0.88 0.92  CALCIUM 8.8* 8.2*  --  8.6* 8.7* 8.7*  MG  --   --  1.8  --   --   --    GFR: Estimated Creatinine Clearance: 70.4 mL/min (by C-G formula based on SCr of 0.92 mg/dL). Liver Function Tests: Recent Labs  Lab 01/21/20 1231  AST 21  ALT 29  ALKPHOS 108  BILITOT 0.5  PROT 7.5  ALBUMIN 3.0*   Recent Labs  Lab 01/21/20 1231  LIPASE 28   No results for input(s): AMMONIA in the last 168 hours. Coagulation Profile: No results for input(s): INR, PROTIME in the last 168 hours. Cardiac Enzymes: No results for input(s): CKTOTAL, CKMB, CKMBINDEX, TROPONINI in the last 168 hours. BNP (last 3 results) No results for input(s): PROBNP in the last 8760 hours. HbA1C: No results for input(s): HGBA1C in the last 72 hours. CBG: Recent Labs  Lab 01/25/20 2006 01/26/20 0008 01/26/20 0428  01/26/20 0835 01/26/20 1202  GLUCAP 296* 265* 201* 232* 163*   Lipid Profile: No results for input(s): CHOL, HDL, LDLCALC, TRIG, CHOLHDL, LDLDIRECT in the last 72 hours. Thyroid Function Tests: No results for input(s): TSH, T4TOTAL, FREET4, T3FREE, THYROIDAB in the last 72 hours. Anemia Panel: No results for input(s): VITAMINB12, FOLATE, FERRITIN, TIBC, IRON, RETICCTPCT in the last 72 hours. Sepsis Labs: Recent Labs  Lab 01/21/20 1231 01/21/20 1555  LATICACIDVEN 1.2 1.5    Recent Results (from the past 240 hour(s))  Respiratory Panel by RT PCR (Flu A&B, Covid) - Nasopharyngeal Swab     Status: None   Collection Time: 01/21/20  3:55 PM   Specimen: Nasopharyngeal Swab  Result Value Ref Range Status   SARS Coronavirus 2 by RT PCR NEGATIVE NEGATIVE Final    Comment: (NOTE) SARS-CoV-2 target nucleic acids are NOT DETECTED. The SARS-CoV-2 RNA is generally detectable in upper respiratoy specimens during the acute phase of infection. The lowest concentration of SARS-CoV-2 viral copies this assay can detect is 131 copies/mL. A negative result does not preclude SARS-Cov-2 infection and should not be used as the sole basis for treatment or other patient management decisions. A negative result may occur with  improper specimen collection/handling, submission of specimen other than nasopharyngeal swab, presence of viral mutation(s) within the areas targeted by this assay, and inadequate number of viral copies (<131 copies/mL). A negative result must be combined with clinical observations, patient history, and epidemiological information. The expected result is Negative. Fact Sheet for Patients:  PinkCheek.be Fact Sheet for Healthcare Providers:  GravelBags.it This test is not yet ap proved or cleared by the [place] FDA and  has been authorized for detection and/or diagnosis of SARS-CoV-2 by FDA under an Emergency Use  Authorization (EUA). This EUA will remain  in effect (meaning this test can be used) for the duration of the COVID-19 declaration under Section 564(b)(1) of the Act, 21 U.S.C. section 360bbb-3(b)(1), unless the authorization is terminated or revoked sooner.    Influenza A by PCR NEGATIVE NEGATIVE Final   Influenza B by PCR NEGATIVE NEGATIVE Final    Comment: (NOTE) The Xpert Xpress SARS-CoV-2/FLU/RSV assay is intended as an aid in  the diagnosis of influenza from Nasopharyngeal swab specimens and  should not be used as a sole basis for treatment. Nasal washings and  aspirates are unacceptable for Xpert Xpress SARS-CoV-2/FLU/RSV  testing. Fact Sheet for Patients: PinkCheek.be Fact Sheet for Healthcare Providers: GravelBags.it This test is not yet approved or cleared by the [place] FDA and  has been authorized for detection and/or diagnosis of SARS-CoV-2 by  FDA under an  Emergency Use Authorization (EUA). This EUA will remain  in effect (meaning this test can be used) for the duration of the  Covid-19 declaration under Section 564(b)(1) of the Act, 21  U.S.C. section 360bbb-3(b)(1), unless the authorization is  terminated or revoked. Performed at [facility], [place], [address] [place], [place] [address]   Culture, blood (Routine X 2) w Reflex to ID Panel     Status: None   Collection Time: 01/21/20  4:54 PM   Specimen: BLOOD  Result Value Ref Range Status   Specimen Description BLOOD BLOOD RIGHT ARM  Final   Special Requests   Final    BOTTLES DRAWN AEROBIC AND ANAEROBIC Blood Culture adequate volume   Culture   Final    NO GROWTH 5 DAYS Performed at [facility], [address] [place], [place] [address]    Report Status 01/26/2020 FINAL  Final  Culture, blood (Routine X 2) w Reflex to ID Panel     Status: None   Collection Time: 01/21/20  4:54 PM   Specimen: BLOOD  Result Value Ref Range  Status   Specimen Description BLOOD BLOOD RIGHT ARM  Final   Special Requests   Final    BOTTLES DRAWN AEROBIC AND ANAEROBIC Blood Culture adequate volume   Culture   Final    NO GROWTH 5 DAYS Performed at [facility], [address] [place], [place] [address]    Report Status 01/26/2020 FINAL  Final  Gastrointestinal Panel by PCR , Stool     Status: None   Collection Time: 01/21/20 10:00 PM   Specimen: Stool  Result Value Ref Range Status   Campylobacter species NOT DETECTED NOT DETECTED Final   Plesimonas shigelloides NOT DETECTED NOT DETECTED Final   Salmonella species NOT DETECTED NOT DETECTED Final   Yersinia enterocolitica NOT DETECTED NOT DETECTED Final   Vibrio species NOT DETECTED NOT DETECTED Final   Vibrio cholerae NOT DETECTED NOT DETECTED Final   Enteroaggregative E coli (EAEC) NOT DETECTED NOT DETECTED Final   Enteropathogenic E coli (EPEC) NOT DETECTED NOT DETECTED Final   Enterotoxigenic E coli (ETEC) NOT DETECTED NOT DETECTED Final   Shiga like toxin producing E coli (STEC) NOT DETECTED NOT DETECTED Final   Shigella/Enteroinvasive E coli (EIEC) NOT DETECTED NOT DETECTED Final   Cryptosporidium NOT DETECTED NOT DETECTED Final   Cyclospora cayetanensis NOT DETECTED NOT DETECTED Final   Entamoeba histolytica NOT DETECTED NOT DETECTED Final   Giardia lamblia NOT DETECTED NOT DETECTED Final   Adenovirus F40/41 NOT DETECTED NOT DETECTED Final   Astrovirus NOT DETECTED NOT DETECTED Final   Norovirus GI/GII NOT DETECTED NOT DETECTED Final   Rotavirus A NOT DETECTED NOT DETECTED Final   Sapovirus (I, II, IV, and V) NOT DETECTED NOT DETECTED Final    Comment: Performed at [facility], [address] [place], [place] [address]  C Difficile Quick Screen w PCR reflex     Status: None   Collection Time: 01/21/20 10:00 PM   Specimen: STOOL  Result Value Ref Range Status   C Diff antigen NEGATIVE NEGATIVE Final   C Diff toxin NEGATIVE NEGATIVE Final    C Diff interpretation No C. difficile detected.  Final    Comment: Performed at [facility], [address], [place],  [address]         Radiology Studies: No results found.      Scheduled Meds: . acidophilus  1 capsule Oral TID  . aspirin  EC  81 mg Oral Daily  . enoxaparin (LOVENOX) injection  40 mg Subcutaneous Q24H  . FLUoxetine  10 mg Oral Daily  . insulin aspart  0-9 Units Subcutaneous Q4H  . insulin glargine  10 Units Subcutaneous QHS  . loperamide  4 mg Oral q morning - 10a  . multivitamin with minerals  1 tablet Oral Daily  . nicotine  21 mg Transdermal Daily  . Ensure Max Protein  11 oz Oral BID BM  . sucralfate  1 g Oral TID WC & HS   Continuous Infusions: . sodium chloride      Assessment & Plan:   Principal Problem:   Diarrhea Active Problems:   GERD (gastroesophageal reflux disease)   AKI (acute kidney injury) ([place])   Hypotension   Anemia of chronic disease   Protein-calorie malnutrition, severe   Tobacco abuse   C. difficile colitis   Urinary retention   Diabetes mellitus without complication (HCC)   Diarrhea: -Monitoring closely -nonbilious nonbloody Recently hospitalized from 3/18-3/25 due to DKA, esophageal candidiasis and C. difficile colitis. Patient was treated with fluconazole and oral vancomycin for 10 days.  Repeat stool study for C. difficile was negative toxin was negative,  -GI pathogen were all negative -Patient was initiated on admission on p.o. vancomycin and ciprofloxacin both were DC'd -Patient does not have any fever,chills, negative sign of sepsis exception of hypertension -All antibiotics will be DC'd for now -We will monitor closely -Lactobacillus added to the regimen Negative hepatitis panel, HIV screening GI input was appreciated-we will change Imodium as needed to standing dose every morning  cscope- nml colon. bx done. Plan: Spoke to Dr Alice Reichert, GI recommended ordering ova and parasite stool  studies and if negative can increase Imodium will place patient on WelChol  Recent history of C. difficile colitis --Repeat stool studies with toxins antigen negative -Withholding p.o. vancomycin  GERD (gastroesophageal reflux disease): -Protonix --will be DC'd as it would increase his chance of recurrent C. difficile  AKI (acute kidney injury) ([place]):  Likely due to dehydration, resolved. -IV fluid as above -Follow-up renal function by BMP  Hypotension:  Likely due to dehydration. Patient does NOT have fever or leukocytosis, and lactic acid is normal. Does not seem to have sepsis -IV fluid as above  Diabetes mellitus without complication: Most recent A1c15.0, poorly controled.  Patient is takingNovoLog and Lantusat home -10 units nightly -Diabetic coordinator consulted,  -CBG QA CHS, SSI   Anemia of chronic disease: Hemoglobin 8.7 down from recent 10.0. No active bleeding -Follow-up with CBC  Protein-calorie malnutrition, severe -Start Ensure  Tobacco abuse -Nicotine patch  Urinary retention: -In and out Foley catheter was done       DVT prophylaxis: Lovenox subcu Code Status: Full Family Communication: None at bedside Disposition Plan: Back home Barrier: cscope today done. Still with lots of diarrhea, needs improvement of sx before d/c, and needs to tolerate po . Possible dc in 1-2 days     LOS: 5 days   Time spent: 45 min with more than 50% COC    Nolberto Hanlon, MD [facility] Pager [phone]  If 7PM-7AM, please contact night-coverage [email or web address] Password [email or web address] 01/26/2020, 1:10 PM Patient ID: Gregory Moody, male   DOB: 1976/05/21, 44 y.o.   MRN: [id number]

## 2020-01-26 NOTE — Progress Notes (Signed)
GI Inpatient Follow-up Note  Subjective:  Patient seen in follow-up for intractable diarrhea. Colonoscopy yesterday with normal examined colon with random biopsies taken and terminal ileum biopsies pending. Further stool studies were ordered and still pending. Pt denies any acute overnight events. He reports he has had 6 BMs since last night which are loose and brown. No frank blood or tarry stools. He denies fever, nausea, vomiting.   Scheduled Inpatient Medications:  . acidophilus  1 capsule Oral TID  . aspirin EC  81 mg Oral Daily  . enoxaparin (LOVENOX) injection  40 mg Subcutaneous Q24H  . FLUoxetine  10 mg Oral Daily  . insulin aspart  0-9 Units Subcutaneous Q4H  . insulin glargine  10 Units Subcutaneous QHS  . loperamide  4 mg Oral q morning - 10a  . multivitamin with minerals  1 tablet Oral Daily  . nicotine  21 mg Transdermal Daily  . Ensure Max Protein  11 oz Oral BID BM  . sucralfate  1 g Oral TID WC & HS    Continuous Inpatient Infusions:   . sodium chloride      PRN Inpatient Medications:  acetaminophen, ondansetron (ZOFRAN) IV  Review of Systems: Constitutional: Weight is stable.  Eyes: No changes in vision. ENT: No oral lesions, sore throat.  GI: see HPI.  Heme/Lymph: No easy bruising.  CV: No chest pain.  GU: No hematuria.  Integumentary: No rashes.  Neuro: No headaches.  Psych: No depression/anxiety.  Endocrine: No heat/cold intolerance.  Allergic/Immunologic: No urticaria.  Resp: No cough, SOB.  Musculoskeletal: No joint swelling.    Physical Examination: BP (!) 148/89 (BP Location: Left Arm)   Pulse 78   Temp 98.8 F (37.1 C) (Oral)   Resp 19   Ht 5\' 7"  (1.702 m)   Wt 48.6 kg   SpO2 100%   BMI 16.78 kg/m  Gen: NAD, alert and oriented x 4 HEENT: PEERLA, EOMI, Neck: supple, no JVD or thyromegaly Chest: CTA bilaterally, no wheezes, crackles, or other adventitious sounds CV: RRR, no m/g/c/r Abd: soft, NT, ND, +BS in all four quadrants; no  HSM, guarding, ridigity, or rebound tenderness Ext: no edema, well perfused with 2+ pulses, Skin: no rash or lesions noted Lymph: no LAD  Data: Lab Results  Component Value Date   WBC 7.4 01/26/2020   HGB 8.4 (L) 01/26/2020   HCT 25.5 (L) 01/26/2020   MCV 93.4 01/26/2020   PLT 418 (H) 01/26/2020   Recent Labs  Lab 01/24/20 0622 01/25/20 0613 01/26/20 0612  HGB 8.8* 8.3* 8.4*   Lab Results  Component Value Date   NA 131 (L) 01/26/2020   K 4.5 01/26/2020   CL 105 01/26/2020   CO2 21 (L) 01/26/2020   BUN 11 01/26/2020   CREATININE 0.92 01/26/2020   Lab Results  Component Value Date   ALT 29 01/21/2020   AST 21 01/21/2020   ALKPHOS 108 01/21/2020   BILITOT 0.5 01/21/2020   No results for input(s): APTT, INR, PTT in the last 168 hours. Assessment/Plan:  44 y/o [name] with a PMH of GERD, uncontrolled T2DM with gastroparesis, Hx of DKA, tobacco abuse, COVID-19 infection 04/2019, nutritional anemia, and failure to thrive admitted for intractable diarrhea  1. Acute on chronic diarrhea - s/p colonoscopy yesterday  2. Hx of c diff colitis - treated with 10-day course of vancomycin last month. Repeat C diff testing on 4/12 was negative.  3. Nutritional anemia - no signs of overt GI blood loss 4.  Abnormal CT of rectum - proctitis  -We discussed results of yesterday's colonoscopy. Biopsies pending. -Check pancreatic elastase to rule out exocrine pancreatic insufficiency -Check A1A stool to assess for protein losing enteropathy -Check 24-hour urine 5-HIAA quant to assess for carcinoid syndrome -Consider diabetic diarrhea if above stool testing is negative -We discussed importance of stricter glycemic control  -Continue Imodium 4mg  in morning and then 2 mg after each loose stool not to exceed 16 mg/daily -He should have diarrhea better control prior to discharge with close follow-up with GI to follow-up on stool testing and biopsies from colon   Please call with  questions or concerns.   Octavia Bruckner, PA-C [facility] [phone] (Cell)

## 2020-01-27 DIAGNOSIS — E875 Hyperkalemia: Secondary | ICD-10-CM

## 2020-01-27 LAB — GLUCOSE, CAPILLARY
Glucose-Capillary: 145 mg/dL — ABNORMAL HIGH (ref 70–99)
Glucose-Capillary: 189 mg/dL — ABNORMAL HIGH (ref 70–99)
Glucose-Capillary: 237 mg/dL — ABNORMAL HIGH (ref 70–99)
Glucose-Capillary: 247 mg/dL — ABNORMAL HIGH (ref 70–99)
Glucose-Capillary: 251 mg/dL — ABNORMAL HIGH (ref 70–99)
Glucose-Capillary: 270 mg/dL — ABNORMAL HIGH (ref 70–99)

## 2020-01-27 LAB — CBC
HCT: 23.4 % — ABNORMAL LOW (ref 39.0–52.0)
Hemoglobin: 7.8 g/dL — ABNORMAL LOW (ref 13.0–17.0)
MCH: 30.8 pg (ref 26.0–34.0)
MCHC: 33.3 g/dL (ref 30.0–36.0)
MCV: 92.5 fL (ref 80.0–100.0)
Platelets: 372 10*3/uL (ref 150–400)
RBC: 2.53 MIL/uL — ABNORMAL LOW (ref 4.22–5.81)
RDW: 14 % (ref 11.5–15.5)
WBC: 7.9 10*3/uL (ref 4.0–10.5)
nRBC: 0 % (ref 0.0–0.2)

## 2020-01-27 LAB — BASIC METABOLIC PANEL
Anion gap: 3 — ABNORMAL LOW (ref 5–15)
BUN: 19 mg/dL (ref 6–20)
CO2: 18 mmol/L — ABNORMAL LOW (ref 22–32)
Calcium: 8.6 mg/dL — ABNORMAL LOW (ref 8.9–10.3)
Chloride: 110 mmol/L (ref 98–111)
Creatinine, Ser: 0.97 mg/dL (ref 0.61–1.24)
GFR calc Af Amer: >60 mL/min (ref >60)
GFR calc non Af Amer: >60 mL/min (ref >60)
Glucose, Bld: 233 mg/dL — ABNORMAL HIGH (ref 70–99)
Potassium: 5.4 mmol/L — ABNORMAL HIGH (ref 3.5–5.1)
Sodium: 131 mmol/L — ABNORMAL LOW (ref 135–145)

## 2020-01-27 LAB — POTASSIUM: Potassium: 5.8 mmol/L — ABNORMAL HIGH (ref 3.5–5.1)

## 2020-01-27 MED ORDER — COLESEVELAM HCL 625 MG PO TABS
1875.0000 mg | ORAL_TABLET | Freq: Two times a day (BID) | ORAL | Status: DC
  Administered 2020-01-27 – 2020-01-30 (×6): 1875 mg via ORAL
  Filled 2020-01-27 (×7): qty 3

## 2020-01-27 NOTE — Plan of Care (Signed)
  Problem: Education: Goal: Knowledge of General Education information will improve Description Including pain rating scale, medication(s)/side effects and non-pharmacologic comfort measures Outcome: Progressing   

## 2020-01-27 NOTE — Progress Notes (Signed)
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 difficile colitis,who presents with diarrhea.  Repeated stool negative for C. difficile toxin antigen, gastric panel negative also. Is very cachectic, with generalized weaknesses  Recently hospitalized from 3/18-3/25 due to DKA, esophageal candidiasis and C. difficile colitis. Patient was treated with fluconazole and oral vancomycin for 10 days.  On admission he was hypotensive with a blood pressure of 85/65 improved with 2 L NS IV fluid to 122/91,    Consultants:   GI  Procedures:    Antimicrobials: Antibiotics for 01/21/2020 p.o. vancomycin, ciprofloxacin till 01/22/2020    Subjective: Ill with 6-7 bouts of diarrhea.  As soon as he eats something even a cracker he has diarrhea.  No other complaints.  Objective: Vitals:   01/26/20 2025 01/27/20 0415 01/27/20 0736 01/27/20 1148  BP: (!) 88/52 90/60 93/63  108/79  Pulse: 94 87 80 86  Resp: 18 17 18 18   Temp: 98.2 F (36.8 C) 98.3 F (36.8 C) 98.9 F (37.2 C) 98.6 F (37 C)  TempSrc:  Oral Oral Oral  SpO2: 100% 100% 100% 100%  Weight:  48.1 kg    Height:        Intake/Output Summary (Last 24 hours) at 01/27/2020 1344 Last data filed at 01/27/2020 1200 Gross per 24 hour  Intake 638.95 ml  Output --  Net 638.95 ml   Filed Weights   01/25/20 0420 01/25/20 1232 01/27/20 0415  Weight: 48.6 kg 48.6 kg 48.1 kg    Examination:  General exam: Appears calm and comfortable, nad Respiratory system: Clear to auscultation. Respiratory effort normal.  No wheeze ,rales, rhonchi  cardiovascular system: S1 & S2 heard, RRR. No JVD, murmurs,  rubs, gallops or clicks.  Gastrointestinal system: Abdomen is soft ,nondistended,nontender.  Normal bowel sounds heard.  no rebound or guarding Central nervous system: Alert and oriented.  Grossly intact Extremities: No edema Skin: Warm dry Psychiatry Mood & affect appropriate in current setting.     Data Reviewed: I have personally reviewed following labs and imaging studies  CBC: Recent Labs  Lab 01/21/20 1231 01/22/20 0433 01/23/20 0530 01/24/20 0622 01/25/20 0613 01/26/20 0612 01/27/20 0427  WBC 6.4   < > 5.4 5.4 4.8 7.4 7.9  NEUTROABS 4.8  --   --   --   --   --   --   HGB 8.7*   < > 7.6* 8.8* 8.3* 8.4* 7.8*  HCT 26.9*   < > 23.0* 25.6* 24.6* 25.5* 23.4*  MCV 94.1   < > 93.1 90.1 91.4 93.4 92.5  PLT 420*   < > 374 407* 407* 418* 372   < > = values in this interval not displayed.   Basic Metabolic Panel: Recent Labs  Lab 01/23/20 0530 01/23/20 0556 01/24/20 0622 01/25/20 0613 01/26/20 0612 01/27/20 0427  NA 135  --  135 135 131* 131*  K 3.9  --  4.3 4.7 4.5 5.4*  CL 113*  --  111 111 105 110  CO2 17*  --  18* 20* 21* 18*  GLUCOSE 101*  --  164* 120* 223* 233*  BUN 16  --  19 12 11 19   CREATININE 0.98  --  0.96 0.88 0.92 0.97  CALCIUM  8.2*  --  8.6* 8.7* 8.7* 8.6*  MG  --  1.8  --   --   --   --    GFR: Estimated Creatinine Clearance: 66.1 mL/min (by C-G formula based on SCr of 0.97 mg/dL). Liver Function Tests: Recent Labs  Lab 01/21/20 1231  AST 21  ALT 29  ALKPHOS 108  BILITOT 0.5  PROT 7.5  ALBUMIN 3.0*   Recent Labs  Lab 01/21/20 1231  LIPASE 28   No results for input(s): AMMONIA in the last 168 hours. Coagulation Profile: No results for input(s): INR, PROTIME in the last 168 hours. Cardiac Enzymes: No results for input(s): CKTOTAL, CKMB, CKMBINDEX, TROPONINI in the last 168 hours. BNP (last 3 results) No results for input(s): PROBNP in the last 8760 hours. HbA1C: No results for input(s): HGBA1C in the last 72 hours. CBG: Recent Labs   Lab 01/26/20 2021 01/26/20 2341 01/27/20 0432 01/27/20 0737 01/27/20 1203  GLUCAP 281* 189* 237* 145* 251*   Lipid Profile: No results for input(s): CHOL, HDL, LDLCALC, TRIG, CHOLHDL, LDLDIRECT in the last 72 hours. Thyroid Function Tests: No results for input(s): TSH, T4TOTAL, FREET4, T3FREE, THYROIDAB in the last 72 hours. Anemia Panel: No results for input(s): VITAMINB12, FOLATE, FERRITIN, TIBC, IRON, RETICCTPCT in the last 72 hours. Sepsis Labs: Recent Labs  Lab 01/21/20 1231 01/21/20 1555  LATICACIDVEN 1.2 1.5    Recent Results (from the past 240 hour(s))  Respiratory Panel by RT PCR (Flu A&B, Covid) - Nasopharyngeal Swab     Status: None   Collection Time: 01/21/20  3:55 PM   Specimen: Nasopharyngeal Swab  Result Value Ref Range Status   SARS Coronavirus 2 by RT PCR NEGATIVE NEGATIVE Final    Comment: (NOTE) SARS-CoV-2 target nucleic acids are NOT DETECTED. The SARS-CoV-2 RNA is generally detectable in upper respiratoy specimens during the acute phase of infection. The lowest concentration of SARS-CoV-2 viral copies this assay can detect is 131 copies/mL. A negative result does not preclude SARS-Cov-2 infection and should not be used as the sole basis for treatment or other patient management decisions. A negative result may occur with  improper specimen collection/handling, submission of specimen other than nasopharyngeal swab, presence of viral mutation(s) within the areas targeted by this assay, and inadequate number of viral copies (<131 copies/mL). A negative result must be combined with clinical observations, patient history, and epidemiological information. The expected result is Negative. Fact Sheet for Patients:  PinkCheek.be Fact Sheet for Healthcare Providers:  GravelBags.it This test is not yet ap proved or cleared by the [place] FDA and  has been authorized for detection and/or diagnosis  of SARS-CoV-2 by FDA under an Emergency Use Authorization (EUA). This EUA will remain  in effect (meaning this test can be used) for the duration of the COVID-19 declaration under Section 564(b)(1) of the Act, 21 U.S.C. section 360bbb-3(b)(1), unless the authorization is terminated or revoked sooner.    Influenza A by PCR NEGATIVE NEGATIVE Final   Influenza B by PCR NEGATIVE NEGATIVE Final    Comment: (NOTE) The Xpert Xpress SARS-CoV-2/FLU/RSV assay is intended as an aid in  the diagnosis of influenza from Nasopharyngeal swab specimens and  should not be used as a sole basis for treatment. Nasal washings and  aspirates are unacceptable for Xpert Xpress SARS-CoV-2/FLU/RSV  testing. Fact Sheet for Patients: PinkCheek.be Fact Sheet for Healthcare Providers: GravelBags.it This test is not yet approved or cleared by the [place] FDA and  has been authorized for detection  and/or diagnosis of SARS-CoV-2 by  FDA under an Emergency Use Authorization (EUA). This EUA will remain  in effect (meaning this test can be used) for the duration of the  Covid-19 declaration under Section 564(b)(1) of the Act, 21  U.S.C. section 360bbb-3(b)(1), unless the authorization is  terminated or revoked. Performed at [facility], [address] [place], [place] [address]   Culture, blood (Routine X 2) w Reflex to ID Panel     Status: None   Collection Time: 01/21/20  4:54 PM   Specimen: BLOOD  Result Value Ref Range Status   Specimen Description BLOOD BLOOD RIGHT ARM  Final   Special Requests   Final    BOTTLES DRAWN AEROBIC AND ANAEROBIC Blood Culture adequate volume   Culture   Final    NO GROWTH 5 DAYS Performed at [facility], [address] [place], [place] [address]    Report Status 01/26/2020 FINAL  Final  Culture, blood (Routine X 2) w Reflex to ID Panel     Status: None   Collection Time: 01/21/20  4:54 PM    Specimen: BLOOD  Result Value Ref Range Status   Specimen Description BLOOD BLOOD RIGHT ARM  Final   Special Requests   Final    BOTTLES DRAWN AEROBIC AND ANAEROBIC Blood Culture adequate volume   Culture   Final    NO GROWTH 5 DAYS Performed at [facility], [address] [place], [place] [address]    Report Status 01/26/2020 FINAL  Final  Gastrointestinal Panel by PCR , Stool     Status: None   Collection Time: 01/21/20 10:00 PM   Specimen: Stool  Result Value Ref Range Status   Campylobacter species NOT DETECTED NOT DETECTED Final   Plesimonas shigelloides NOT DETECTED NOT DETECTED Final   Salmonella species NOT DETECTED NOT DETECTED Final   Yersinia enterocolitica NOT DETECTED NOT DETECTED Final   Vibrio species NOT DETECTED NOT DETECTED Final   Vibrio cholerae NOT DETECTED NOT DETECTED Final   Enteroaggregative E coli (EAEC) NOT DETECTED NOT DETECTED Final   Enteropathogenic E coli (EPEC) NOT DETECTED NOT DETECTED Final   Enterotoxigenic E coli (ETEC) NOT DETECTED NOT DETECTED Final   Shiga like toxin producing E coli (STEC) NOT DETECTED NOT DETECTED Final   Shigella/Enteroinvasive E coli (EIEC) NOT DETECTED NOT DETECTED Final   Cryptosporidium NOT DETECTED NOT DETECTED Final   Cyclospora cayetanensis NOT DETECTED NOT DETECTED Final   Entamoeba histolytica NOT DETECTED NOT DETECTED Final   Giardia lamblia NOT DETECTED NOT DETECTED Final   Adenovirus F40/41 NOT DETECTED NOT DETECTED Final   Astrovirus NOT DETECTED NOT DETECTED Final   Norovirus GI/GII NOT DETECTED NOT DETECTED Final   Rotavirus A NOT DETECTED NOT DETECTED Final   Sapovirus (I, II, IV, and V) NOT DETECTED NOT DETECTED Final    Comment: Performed at [facility], [address] [place], [place] [address]  C Difficile Quick Screen w PCR reflex     Status: None   Collection Time: 01/21/20 10:00 PM   Specimen: STOOL  Result Value Ref Range Status   C Diff antigen NEGATIVE NEGATIVE Final    C Diff toxin NEGATIVE NEGATIVE Final   C Diff interpretation No C. difficile detected.  Final    Comment: Performed at [facility], [address] [place], [place] [address]         Radiology Studies: No results found.      Scheduled Meds: .  acidophilus  1 capsule Oral TID  . aspirin EC  81 mg Oral Daily  . colesevelam  1,875 mg Oral BID WC  . enoxaparin (LOVENOX) injection  40 mg Subcutaneous Q24H  . FLUoxetine  10 mg Oral Daily  . insulin aspart  0-9 Units Subcutaneous Q4H  . insulin glargine  10 Units Subcutaneous QHS  . loperamide  4 mg Oral q morning - 10a  . multivitamin with minerals  1 tablet Oral Daily  . nicotine  21 mg Transdermal Daily  . Ensure Max Protein  11 oz Oral BID BM  . sucralfate  1 g Oral TID WC & HS   Continuous Infusions: . sodium chloride 75 mL/hr at 01/27/20 1200    Assessment & Plan:   Principal Problem:   Diarrhea Active Problems:   GERD (gastroesophageal reflux disease)   AKI (acute kidney injury) ([place])   Hypotension   Anemia of chronic disease   Protein-calorie malnutrition, severe   Tobacco abuse   C. difficile colitis   Urinary retention   Diabetes mellitus without complication (HCC)   Diarrhea: -Monitoring closely -nonbilious nonbloody Recently hospitalized from 3/18-3/25 due to DKA, esophageal candidiasis and C. difficile colitis. Patient was treated with fluconazole and oral vancomycin for 10 days.  Repeat stool study for C. difficile was negative toxin was negative,  -GI pathogen were all negative -Patient was initiated on admission on p.o. vancomycin and ciprofloxacin both were DC'd -Lactobacillus added to the regimen Negative hepatitis panel, HIV screening GI following-status post colonoscopy with normal colon Biopsies from colonoscopy and subsequent stool studies to rule out chronic infections, EPI, carcinoid, and protein losing enteropathy pending Plan: Stool O&P pending GI StartED Welchol 1.875g PO  BID. Close GI outpatient follow-up upon discharge  Recent history of C. difficile colitis --Repeat stool studies with toxins antigen negative -Withholding p.o. vancomycin  GERD (gastroesophageal reflux disease): -Protonix --will be DC'd as it would increase his chance of recurrent C. difficile  AKI (acute kidney injury) ([place]):  Likely due to dehydration, resolved. -IV fluid as above -Follow-up renal function by BMP  Hyperkalemia-recheck level since he is having diarrhea, if elevated will give insulin  Hypotension:  Likely due to dehydration. Patient does NOT have fever or leukocytosis, and lactic acid is normal. Does not seem to have sepsis -IV fluid as above  Diabetes mellitus without complication: Most recent A1c15.0, poorly controled.  Patient is takingNovoLog and Lantusat home -10 units nightly -Diabetic coordinator consulted,  -CBG QA CHS, SSI   Anemia of chronic disease: Hemoglobin 8.7 down from recent 10.0. No active bleeding -Follow-up with CBC  Protein-calorie malnutrition, severe -Start Ensure  Tobacco abuse -Nicotine patch  Urinary retention: -In and out Foley catheter was done       DVT prophylaxis: Lovenox subcu Code Status: Full Family Communication: None at bedside Disposition Plan: Back home Barrier: Still on IV fluids, having several bouts of diarrhea.  Need to see if his diarrhea slows down prior to discharge in 1 or 2 days.     LOS: 6 days   Time spent: 45 min with more than 50% COC    Nolberto Hanlon, MD [facility] Pager [phone]  If 7PM-7AM, please contact night-coverage [email or web address] Password [facility] 01/27/2020, 1:44 PM Patient ID: Gregory Moody, male   DOB: 12/12/1975, 44 y.o.   MRN: [id number]

## 2020-01-27 NOTE — Plan of Care (Signed)
Pt has excellent appetite, and taking PO solid and fluids well.  Frequency of diarrhea episodes is decreasing.     Problem: Education: Goal: Knowledge of General Education information will improve Description: Including pain rating scale, medication(s)/side effects and non-pharmacologic comfort measures Outcome: Progressing   Problem: Health Behavior/Discharge Planning: Goal: Ability to manage health-related needs will improve Outcome: Progressing   Problem: Clinical Measurements: Goal: Ability to maintain clinical measurements within normal limits will improve Outcome: Progressing Goal: Will remain free from infection Outcome: Progressing Goal: Diagnostic test results will improve Outcome: Progressing Goal: Respiratory complications will improve Outcome: Progressing Goal: Cardiovascular complication will be avoided Outcome: Progressing   Problem: Activity: Goal: Risk for activity intolerance will decrease Outcome: Progressing   Problem: Nutrition: Goal: Adequate nutrition will be maintained Outcome: Progressing   Problem: Coping: Goal: Level of anxiety will decrease Outcome: Progressing   Problem: Elimination: Goal: Will not experience complications related to bowel motility Outcome: Progressing Goal: Will not experience complications related to urinary retention Outcome: Progressing   Problem: Pain Managment: Goal: General experience of comfort will improve Outcome: Progressing   Problem: Safety: Goal: Ability to remain free from injury will improve Outcome: Progressing   Problem: Skin Integrity: Goal: Risk for impaired skin integrity will decrease Outcome: Progressing

## 2020-01-27 NOTE — Progress Notes (Signed)
GI Inpatient Follow-up Note  Subjective:  Patient seen in follow-up for acute on chronic diarrhea. No acute overnight events. He still complains of diarrhea. He reports since yesterday he has had 6 BMs which were brown and watery. No nausea, vomiting, abdominal pain. Biopsies from colonoscopy and subsequent stool studies still pending.   Scheduled Inpatient Medications:  . acidophilus  1 capsule Oral TID  . aspirin EC  81 mg Oral Daily  . enoxaparin (LOVENOX) injection  40 mg Subcutaneous Q24H  . FLUoxetine  10 mg Oral Daily  . insulin aspart  0-9 Units Subcutaneous Q4H  . insulin glargine  10 Units Subcutaneous QHS  . loperamide  4 mg Oral q morning - 10a  . multivitamin with minerals  1 tablet Oral Daily  . nicotine  21 mg Transdermal Daily  . Ensure Max Protein  11 oz Oral BID BM  . sucralfate  1 g Oral TID WC & HS    Continuous Inpatient Infusions:   . sodium chloride 75 mL/hr at 01/27/20 0603    PRN Inpatient Medications:  acetaminophen, ondansetron (ZOFRAN) IV  Review of Systems: Constitutional: Weight is stable.  Eyes: No changes in vision. ENT: No oral lesions, sore throat.  GI: see HPI.  Heme/Lymph: No easy bruising.  CV: No chest pain.  GU: No hematuria.  Integumentary: No rashes.  Neuro: No headaches.  Psych: No depression/anxiety.  Endocrine: No heat/cold intolerance.  Allergic/Immunologic: No urticaria.  Resp: No cough, SOB.  Musculoskeletal: No joint swelling.    Physical Examination: BP 93/63 (BP Location: Right Arm)   Pulse 80   Temp 98.9 F (37.2 C) (Oral)   Resp 18   Ht 5\' 7"  (1.702 m)   Wt 48.1 kg   SpO2 100%   BMI 16.61 kg/m  Gen: NAD, alert and oriented x 4 HEENT: PEERLA, EOMI, Neck: supple, no JVD or thyromegaly Chest: CTA bilaterally, no wheezes, crackles, or other adventitious sounds CV: RRR, no m/g/c/r Abd: soft, NT, ND, +BS in all four quadrants; no HSM, guarding, ridigity, or rebound tenderness Ext: no edema, well perfused with  2+ pulses, Skin: no rash or lesions noted Lymph: no LAD  Data: Lab Results  Component Value Date   WBC 7.9 01/27/2020   HGB 7.8 (L) 01/27/2020   HCT 23.4 (L) 01/27/2020   MCV 92.5 01/27/2020   PLT 372 01/27/2020   Recent Labs  Lab 01/25/20 0613 01/26/20 0612 01/27/20 0427  HGB 8.3* 8.4* 7.8*   Lab Results  Component Value Date   NA 131 (L) 01/27/2020   K 5.4 (H) 01/27/2020   CL 110 01/27/2020   CO2 18 (L) 01/27/2020   BUN 19 01/27/2020   CREATININE 0.97 01/27/2020   Lab Results  Component Value Date   ALT 29 01/21/2020   AST 21 01/21/2020   ALKPHOS 108 01/21/2020   BILITOT 0.5 01/21/2020   No results for input(s): APTT, INR, PTT in the last 168 hours.   Assessment/:  44 y/o [name] with a PMH of GERD, uncontrolled T2DM with gastroparesis, Hx of DKA, tobacco abuse, COVID-19 infection 04/2019, nutritional anemia, and failure to thrive admitted for intractable diarrhea  1. Acute on chronic diarrhea - s/p colonoscopy 01/25/20 2. Hx of c diff colitis - treated with 10-day course of vancomycin last month. Repeat C diff testing on 4/12 was negative.  3. Nutritional anemia - no signs of overt GI blood loss. Hgb 7.8 this morning 4. Abnormal CT of rectum - proctitis. Colonoscopy showed no  proctitis or colitis. Biopsies pending.  5. Uncontrolled T2DM  Recommendations:   1. Patient still have diarrhea despite use of Imodium. Biopsies from colonoscopy and subsequent stool studies to rule out chronic infections, EPI, carcinoid, and protein losing enteropathy pending 2. Start Welchol 1.875g PO BID. This can also help with his glycemic control. 3. Follow-up on results of stool studies and biopsy results 4. Close GI outpatient follow-up. Stressed importance of follow-up to the patient today.    Please call with questions or concerns.    Octavia Bruckner, PA-C [facility] [phone] (Cell)

## 2020-01-28 ENCOUNTER — Encounter: Payer: Self-pay | Admitting: *Deleted

## 2020-01-28 LAB — BASIC METABOLIC PANEL
Anion gap: 4 — ABNORMAL LOW (ref 5–15)
BUN: 38 mg/dL — ABNORMAL HIGH (ref 6–20)
CO2: 24 mmol/L (ref 22–32)
Calcium: 9 mg/dL (ref 8.9–10.3)
Chloride: 106 mmol/L (ref 98–111)
Creatinine, Ser: 1 mg/dL (ref 0.61–1.24)
GFR calc Af Amer: >60 mL/min (ref >60)
GFR calc non Af Amer: >60 mL/min (ref >60)
Glucose, Bld: 153 mg/dL — ABNORMAL HIGH (ref 70–99)
Potassium: 5.1 mmol/L (ref 3.5–5.1)
Sodium: 134 mmol/L — ABNORMAL LOW (ref 135–145)

## 2020-01-28 LAB — GLUCOSE, CAPILLARY
Glucose-Capillary: 149 mg/dL — ABNORMAL HIGH (ref 70–99)
Glucose-Capillary: 158 mg/dL — ABNORMAL HIGH (ref 70–99)
Glucose-Capillary: 213 mg/dL — ABNORMAL HIGH (ref 70–99)
Glucose-Capillary: 227 mg/dL — ABNORMAL HIGH (ref 70–99)
Glucose-Capillary: 257 mg/dL — ABNORMAL HIGH (ref 70–99)
Glucose-Capillary: 334 mg/dL — ABNORMAL HIGH (ref 70–99)
Glucose-Capillary: 334 mg/dL — ABNORMAL HIGH (ref 70–99)

## 2020-01-28 MED ORDER — SODIUM CHLORIDE 0.45 % IV SOLN: INTRAVENOUS | Status: DC

## 2020-01-28 NOTE — Progress Notes (Signed)
GI Inpatient Follow-up Note  Subjective:  Patient seen in follow-up for intractable diarrhea. Diarrhea seems to be improving. He reports two BMs today since this morning and appear to not be as loose. No new complaints. Biopsies pending and stool studies pending.   Scheduled Inpatient Medications:  . acidophilus  1 capsule Oral TID  . aspirin EC  81 mg Oral Daily  . colesevelam  1,875 mg Oral BID WC  . enoxaparin (LOVENOX) injection  40 mg Subcutaneous Q24H  . FLUoxetine  10 mg Oral Daily  . insulin aspart  0-9 Units Subcutaneous Q4H  . insulin glargine  10 Units Subcutaneous QHS  . loperamide  4 mg Oral q morning - 10a  . multivitamin with minerals  1 tablet Oral Daily  . nicotine  21 mg Transdermal Daily  . Ensure Max Protein  11 oz Oral BID BM  . sucralfate  1 g Oral TID WC & HS    Continuous Inpatient Infusions:   . sodium chloride 50 mL/hr at 01/28/20 1656    PRN Inpatient Medications:  acetaminophen, ondansetron (ZOFRAN) IV  Review of Systems: Constitutional: Weight is stable.  Eyes: No changes in vision. ENT: No oral lesions, sore throat.  GI: see HPI.  Heme/Lymph: No easy bruising.  CV: No chest pain.  GU: No hematuria.  Integumentary: No rashes.  Neuro: No headaches.  Psych: No depression/anxiety.  Endocrine: No heat/cold intolerance.  Allergic/Immunologic: No urticaria.  Resp: No cough, SOB.  Musculoskeletal: No joint swelling.    Physical Examination: BP 117/79 (BP Location: Left Arm)   Pulse 83   Temp 98.2 F (36.8 C) (Oral)   Resp 18   Ht 5\' 7"  (1.702 m)   Wt 52.4 kg   SpO2 100%   BMI 18.09 kg/m  Gen: NAD, alert and oriented x 4 HEENT: PEERLA, EOMI, Neck: supple, no JVD or thyromegaly Chest: CTA bilaterally, no wheezes, crackles, or other adventitious sounds CV: RRR, no m/g/c/r Abd: soft, NT, ND, +BS in all four quadrants; no HSM, guarding, ridigity, or rebound tenderness Ext: no edema, well perfused with 2+ pulses, Skin: no rash or lesions  noted Lymph: no LAD  Data: Lab Results  Component Value Date   WBC 7.9 01/27/2020   HGB 7.8 (L) 01/27/2020   HCT 23.4 (L) 01/27/2020   MCV 92.5 01/27/2020   PLT 372 01/27/2020   Recent Labs  Lab 01/25/20 0613 01/26/20 0612 01/27/20 0427  HGB 8.3* 8.4* 7.8*   Lab Results  Component Value Date   NA 134 (L) 01/28/2020   K 5.1 01/28/2020   CL 106 01/28/2020   CO2 24 01/28/2020   BUN 38 (H) 01/28/2020   CREATININE 1.00 01/28/2020   Lab Results  Component Value Date   ALT 29 01/21/2020   AST 21 01/21/2020   ALKPHOS 108 01/21/2020   BILITOT 0.5 01/21/2020   No results for input(s): APTT, INR, PTT in the last 168 hours. Assessment/Plan:  44 y/o [name] with a PMH of GERD, uncontrolled T2DM with gastroparesis, Hx of DKA, tobacco abuse, COVID-19 infection 04/2019, nutritional anemia, and failure to thrive admitted for intractable diarrhea  1. Acute on chronic diarrhea - s/p colonoscopy yesterday  2. Hx of c diff colitis - treated with 10-day course of vancomycin last month. Repeat C diff testing on 4/12 was negative.  3. Nutritional anemia - no signs of overt GI blood loss 4. Abnormal CT of rectum - proctitis  -Colonoscopy biopsies pending, elastase, 5-HIAA, A1A stool pending -Continue  Welchol 1.875 PO BID -Continue to follow-up on results of colonoscopy biopsies and further stool studies -Diarrhea improving with addition of Welchol  -Anticipate discharge tomorrow with close outpatient GI follow-up. -Continue carb modified diet    Please call with questions or concerns.    Octavia Bruckner, PA-C [facility] [phone] (Cell)

## 2020-01-28 NOTE — Plan of Care (Signed)
  Problem: Health Behavior/Discharge Planning: Goal: Ability to manage health-related needs will improve Outcome: Progressing   

## 2020-01-28 NOTE — Progress Notes (Signed)
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 difficile colitis,who presents with diarrhea.  Repeated stool negative for C. difficile toxin antigen, gastric panel negative also. Is very cachectic, with generalized weaknesses  Recently hospitalized from 3/18-3/25 due to DKA, esophageal candidiasis and C. difficile colitis. Patient was treated with fluconazole and oral vancomycin for 10 days.  On admission he was hypotensive with a blood pressure of 85/65 improved with 2 L NS IV fluid to 122/91,    Consultants:   GI  Procedures:    Antimicrobials: Antibiotics for 01/21/2020 p.o. vancomycin, ciprofloxacin till 01/22/2020    Subjective: Had 5 diarrheas overnight but this a.m. is slowing down.  He does feel dizzy episodically.  His blood pressure is on the low side. Objective: Vitals:   01/28/20 0725 01/28/20 0729 01/28/20 1136 01/28/20 1522  BP:  95/68 120/85 117/79  Pulse:  81 85 83  Resp: 15 19 18 18   Temp:  98.6 F (37 C) 98.8 F (37.1 C) 98.2 F (36.8 C)  TempSrc:  Oral Oral Oral  SpO2:  100% 100% 100%  Weight:      Height:        Intake/Output Summary (Last 24 hours) at 01/28/2020 1703 Last data filed at 01/28/2020 1656 Gross per 24 hour  Intake 854.76 ml  Output 0 ml  Net 854.76 ml   Filed Weights   01/25/20 1232 01/27/20 0415 01/28/20 0338  Weight: 48.6 kg 48.1 kg 52.4 kg    Examination:  General exam: Appears calm and comfortable, nad Respiratory system: Clear to auscultation. Respiratory effort normal.  No wheeze ,rales, rhonchi  cardiovascular system: S1 & S2 heard, RRR. No JVD, murmurs, rubs, gallops or clicks.   Gastrointestinal system: Abdomen is soft ,nondistended,nontender.  Normal bowel sounds heard.   Central nervous system: Alert and oriented.  Grossly intact Extremities: No edema Skin: Warm dry Psychiatry Mood & affect appropriate in current setting.     Data Reviewed: I have personally reviewed following labs and imaging studies  CBC: Recent Labs  Lab 01/23/20 0530 01/24/20 0622 01/25/20 0613 01/26/20 0612 01/27/20 0427  WBC 5.4 5.4 4.8 7.4 7.9  HGB 7.6* 8.8* 8.3* 8.4* 7.8*  HCT 23.0* 25.6* 24.6* 25.5* 23.4*  MCV 93.1 90.1 91.4 93.4 92.5  PLT 374 407* 407* 418* 287   Basic Metabolic Panel: Recent Labs  Lab 01/23/20 0530 01/23/20 0556 01/24/20 0622 01/24/20 0622 01/25/20 8676 01/26/20 0612 01/27/20 0427 01/27/20 1426 01/28/20 0613  NA   < >  --  135  --  135 131* 131*  --  134*  K   < >  --  4.3   < > 4.7 4.5 5.4* 5.8* 5.1  CL   < >  --  111  --  111 105 110  --  106  CO2   < >  --  18*  --  20* 21* 18*  --  24  GLUCOSE   < >  --  164*  --  120* 223* 233*  --  153*  BUN   < >  --  19  --  12 11 19   --  38*  CREATININE   < >  --  0.96  --  0.88  0.92 0.97  --  1.00  CALCIUM   < >  --  8.6*  --  8.7* 8.7* 8.6*  --  9.0  MG  --  1.8  --   --   --   --   --   --   --    < > = values in this interval not displayed.   GFR: Estimated Creatinine Clearance: 69.9 mL/min (by C-G formula based on SCr of 1 mg/dL). Liver Function Tests: No results for input(s): AST, ALT, ALKPHOS, BILITOT, PROT, ALBUMIN in the last 168 hours. No results for input(s): LIPASE, AMYLASE in the last 168 hours. No results for input(s): AMMONIA in the last 168 hours. Coagulation Profile: No results for input(s): INR, PROTIME in the last 168 hours. Cardiac Enzymes: No results for input(s): CKTOTAL, CKMB, CKMBINDEX, TROPONINI in the last 168 hours. BNP (last 3 results) No results for input(s): PROBNP in the last 8760 hours. HbA1C: No results for input(s): HGBA1C in the last 72 hours. CBG: Recent  Labs  Lab 01/28/20 0046 01/28/20 0425 01/28/20 0731 01/28/20 1137 01/28/20 1615  GLUCAP 213* 158* 149* 334* 227*   Lipid Profile: No results for input(s): CHOL, HDL, LDLCALC, TRIG, CHOLHDL, LDLDIRECT in the last 72 hours. Thyroid Function Tests: No results for input(s): TSH, T4TOTAL, FREET4, T3FREE, THYROIDAB in the last 72 hours. Anemia Panel: No results for input(s): VITAMINB12, FOLATE, FERRITIN, TIBC, IRON, RETICCTPCT in the last 72 hours. Sepsis Labs: No results for input(s): PROCALCITON, LATICACIDVEN in the last 168 hours.  Recent Results (from the past 240 hour(s))  Respiratory Panel by RT PCR (Flu A&B, Covid) - Nasopharyngeal Swab     Status: None   Collection Time: 01/21/20  3:55 PM   Specimen: Nasopharyngeal Swab  Result Value Ref Range Status   SARS Coronavirus 2 by RT PCR NEGATIVE NEGATIVE Final    Comment: (NOTE) SARS-CoV-2 target nucleic acids are NOT DETECTED. The SARS-CoV-2 RNA is generally detectable in upper respiratoy specimens during the acute phase of infection. The lowest concentration of SARS-CoV-2 viral copies this assay can detect is 131 copies/mL. A negative result does not preclude SARS-Cov-2 infection and should not be used as the sole basis for treatment or other patient management decisions. A negative result may occur with  improper specimen collection/handling, submission of specimen other than nasopharyngeal swab, presence of viral mutation(s) within the areas targeted by this assay, and inadequate number of viral copies (<131 copies/mL). A negative result must be combined with clinical observations, patient history, and epidemiological information. The expected result is Negative. Fact Sheet for Patients:  PinkCheek.be Fact Sheet for Healthcare Providers:  GravelBags.it This test is not yet ap proved or cleared by the [place] FDA and  has been authorized for detection and/or  diagnosis of SARS-CoV-2 by FDA under an Emergency Use Authorization (EUA). This EUA will remain  in effect (meaning this test can be used) for the duration of the COVID-19 declaration under Section 564(b)(1) of the Act, 21 U.S.C. section 360bbb-3(b)(1), unless the authorization is terminated or revoked sooner.    Influenza A by PCR NEGATIVE NEGATIVE Final   Influenza B by PCR NEGATIVE NEGATIVE Final    Comment: (NOTE) The Xpert Xpress SARS-CoV-2/FLU/RSV assay is intended as an aid in  the diagnosis of influenza from Nasopharyngeal swab specimens and  should not be used as a sole basis for treatment. Nasal washings and  aspirates are unacceptable for Xpert Xpress SARS-CoV-2/FLU/RSV  testing. Fact Sheet for Patients: PinkCheek.be Fact Sheet  for Healthcare Providers: GravelBags.it This test is not yet approved or cleared by the [place] and  has been authorized for detection and/or diagnosis of SARS-CoV-2 by  FDA under an Emergency Use Authorization (EUA). This EUA will remain  in effect (meaning this test can be used) for the duration of the  Covid-19 declaration under Section 564(b)(1) of the Act, 21  U.S.C. section 360bbb-3(b)(1), unless the authorization is  terminated or revoked. Performed at [facility], [address] [place], [place] [address]   Culture, blood (Routine X 2) w Reflex to ID Panel     Status: None   Collection Time: 01/21/20  4:54 PM   Specimen: BLOOD  Result Value Ref Range Status   Specimen Description BLOOD BLOOD RIGHT ARM  Final   Special Requests   Final    BOTTLES DRAWN AEROBIC AND ANAEROBIC Blood Culture adequate volume   Culture   Final    NO GROWTH 5 DAYS Performed at [facility], [address] [place], [place] [address]    Report Status 01/26/2020 FINAL  Final  Culture, blood (Routine X 2) w Reflex to ID Panel     Status: None   Collection Time: 01/21/20   4:54 PM   Specimen: BLOOD  Result Value Ref Range Status   Specimen Description BLOOD BLOOD RIGHT ARM  Final   Special Requests   Final    BOTTLES DRAWN AEROBIC AND ANAEROBIC Blood Culture adequate volume   Culture   Final    NO GROWTH 5 DAYS Performed at [facility], [address] [place], [place] [address]    Report Status 01/26/2020 FINAL  Final  Gastrointestinal Panel by PCR , Stool     Status: None   Collection Time: 01/21/20 10:00 PM   Specimen: Stool  Result Value Ref Range Status   Campylobacter species NOT DETECTED NOT DETECTED Final   Plesimonas shigelloides NOT DETECTED NOT DETECTED Final   Salmonella species NOT DETECTED NOT DETECTED Final   Yersinia enterocolitica NOT DETECTED NOT DETECTED Final   Vibrio species NOT DETECTED NOT DETECTED Final   Vibrio cholerae NOT DETECTED NOT DETECTED Final   Enteroaggregative E coli (EAEC) NOT DETECTED NOT DETECTED Final   Enteropathogenic E coli (EPEC) NOT DETECTED NOT DETECTED Final   Enterotoxigenic E coli (ETEC) NOT DETECTED NOT DETECTED Final   Shiga like toxin producing E coli (STEC) NOT DETECTED NOT DETECTED Final   Shigella/Enteroinvasive E coli (EIEC) NOT DETECTED NOT DETECTED Final   Cryptosporidium NOT DETECTED NOT DETECTED Final   Cyclospora cayetanensis NOT DETECTED NOT DETECTED Final   Entamoeba histolytica NOT DETECTED NOT DETECTED Final   Giardia lamblia NOT DETECTED NOT DETECTED Final   Adenovirus F40/41 NOT DETECTED NOT DETECTED Final   Astrovirus NOT DETECTED NOT DETECTED Final   Norovirus GI/GII NOT DETECTED NOT DETECTED Final   Rotavirus A NOT DETECTED NOT DETECTED Final   Sapovirus (I, II, IV, and V) NOT DETECTED NOT DETECTED Final    Comment: Performed at [facility], [address] [place], [place] [address]  C Difficile Quick Screen w PCR reflex     Status: None   Collection Time: 01/21/20 10:00 PM   Specimen: STOOL  Result Value Ref Range Status   C Diff antigen NEGATIVE  NEGATIVE Final   C Diff toxin NEGATIVE NEGATIVE Final   C Diff interpretation No C. difficile detected.  Final    Comment: Performed at [facility], [address],  [place], [place] [address]         Radiology Studies: No results found.      Scheduled Meds: . acidophilus  1 capsule Oral TID  . aspirin EC  81 mg Oral Daily  . colesevelam  1,875 mg Oral BID WC  . enoxaparin (LOVENOX) injection  40 mg Subcutaneous Q24H  . FLUoxetine  10 mg Oral Daily  . insulin aspart  0-9 Units Subcutaneous Q4H  . insulin glargine  10 Units Subcutaneous QHS  . loperamide  4 mg Oral q morning - 10a  . multivitamin with minerals  1 tablet Oral Daily  . nicotine  21 mg Transdermal Daily  . Ensure Max Protein  11 oz Oral BID BM  . sucralfate  1 g Oral TID WC & HS   Continuous Infusions: . sodium chloride 50 mL/hr at 01/28/20 1656    Assessment & Plan:   Principal Problem:   Diarrhea Active Problems:   GERD (gastroesophageal reflux disease)   AKI (acute kidney injury) ([place])   Hypotension   Anemia of chronic disease   Protein-calorie malnutrition, severe   Tobacco abuse   C. difficile colitis   Urinary retention   Diabetes mellitus without complication (HCC)   Diarrhea: -Monitoring closely -nonbilious nonbloody Recently hospitalized from 3/18-3/25 due to DKA, esophageal candidiasis and C. difficile colitis. Patient was treated with fluconazole and oral vancomycin for 10 days.  Repeat stool study for C. difficile was negative toxin was negative,  -GI pathogen were all negative -Patient was initiated on admission on p.o. vancomycin and ciprofloxacin both were DC'd -Lactobacillus added to the regimen Negative hepatitis panel, HIV screening GI following-status post colonoscopy with normal colon Biopsies from colonoscopy and subsequent stool studies to rule out chronic infections, EPI, carcinoid, and protein losing enteropathy pending Stool studies are negative Continue  Welchol 1.875g PO BID. Close GI outpatient follow-up upon discharge  Recent history of C. difficile colitis --Repeat stool studies with toxins antigen negative -Withholding p.o. vancomycin  GERD (gastroesophageal reflux disease): -Protonix --will be DC'd as it would increase his chance of recurrent C. difficile  AKI (acute kidney injury) ([place]):  Likely due to dehydration, resolved. -IV fluid as above -Follow-up renal function by BMP  Hyperkalemia-recheck level since he is having diarrhea, if elevated will give insulin  Hypotension:  Likely due to dehydration. Patient does NOT have fever or leukocytosis, and lactic acid is normal. Does not seem to have sepsis -IV fluid as above  Diabetes mellitus without complication: Most recent A1c15.0, poorly controled.  Patient is takingNovoLog and Lantusat home -10 units nightly -Diabetic coordinator consulted,  -CBG QA CHS, SSI   Anemia of chronic disease: Hemoglobin 8.7 down from recent 10.0. No active bleeding -Follow-up with CBC  Protein-calorie malnutrition, severe -Start Ensure  Tobacco abuse -Nicotine patch  Urinary retention: -In and out Foley catheter was done       DVT prophylaxis: Lovenox subcu Code Status: Full Family Communication: None at bedside Disposition Plan: Back home Barrier: Still on IV fluids, having several bouts of diarrhea.  He is hypotensive and symptomatic.  If his diarrhea improves and his blood pressure better will DC in a.m.       LOS: 7 days   Time spent: 45 min with more than 50% COC    Nolberto Hanlon, MD [facility] Pager [phone]  If 7PM-7AM, please contact night-coverage [email or web address] Password [facility] 01/28/2020, 5:03 PM Patient ID: Gregory Moody, male   DOB: 1976-04-10, 44 y.o.   MRN: [id number]

## 2020-01-28 NOTE — Progress Notes (Signed)
Inpatient Diabetes Program Recommendations  AACE/ADA: New Consensus Statement on Inpatient Glycemic Control (2015)  Target Ranges:  Prepandial:   less than 140 mg/dL      Peak postprandial:   less than 180 mg/dL (1-2 hours)      Critically ill patients:  140 - 180 mg/dL   Results for Gregory Moody, Gregory Moody (MRN [id number]) as of 01/28/2020 14:51  Ref. Range 01/28/2020 00:46 01/28/2020 04:25 01/28/2020 07:31 01/28/2020 11:37  Glucose-Capillary Latest Ref Range: 70 - 99 mg/dL 213 (H)  3 units NOVOLOG  158 (H)  2 units NOVOLOG  149 (H)  1 unit NOVOLOG  334 (H)  7 units NOVOLOG      Home DM Meds: Lantus 10 units QHS           Novolog 2 units TID with meals  Current Orders: Lantus 10 units QHS      Novolog Sensitive Correction Scale/ SSI (0-9 units) Q4 hours     MD- Note patient now getting solid PO diet + Ensure PO supps.  Having elevated CBGs after meals.  Please consider the following:  1. Change timing of the Novolog SSi to tid ac + hs (currently ordered Q4 hours)  2. Start low dose Novolog Meal Coverage: Novolog 4 units TID with meals  (Please add the following Hold Parameters: Hold if pt eats <50% of meal, Hold if pt NPO)     --Will follow patient during hospitalization--  Wyn Quaker RN, MSN, CDE Diabetes Coordinator Inpatient Glycemic Control Team Team Pager: [phone] (8a-5p)

## 2020-01-29 LAB — SURGICAL PATHOLOGY

## 2020-01-29 LAB — POTASSIUM
Potassium: 5.3 mmol/L — ABNORMAL HIGH (ref 3.5–5.1)
Potassium: 5.7 mmol/L — ABNORMAL HIGH (ref 3.5–5.1)
Potassium: 5.6 mmol/L — ABNORMAL HIGH (ref 3.5–5.1)

## 2020-01-29 LAB — GLUCOSE, CAPILLARY
Glucose-Capillary: 311 mg/dL — ABNORMAL HIGH (ref 70–99)
Glucose-Capillary: 167 mg/dL — ABNORMAL HIGH (ref 70–99)
Glucose-Capillary: 181 mg/dL — ABNORMAL HIGH (ref 70–99)
Glucose-Capillary: 109 mg/dL — ABNORMAL HIGH (ref 70–99)
Glucose-Capillary: 317 mg/dL — ABNORMAL HIGH (ref 70–99)
Glucose-Capillary: 402 mg/dL — ABNORMAL HIGH (ref 70–99)

## 2020-01-29 MED ORDER — INSULIN ASPART 100 UNIT/ML ~~LOC~~ SOLN
0.0000 [IU] | Freq: Every day | SUBCUTANEOUS | Status: DC
  Administered 2020-01-29: 5 [IU] via SUBCUTANEOUS

## 2020-01-29 MED ORDER — DIPHENOXYLATE-ATROPINE 2.5-0.025 MG/5ML PO LIQD
10.0000 mL | Freq: Once | ORAL | Status: AC
  Administered 2020-01-29: 10 mL via ORAL
  Filled 2020-01-29: qty 10

## 2020-01-29 MED ORDER — SODIUM ZIRCONIUM CYCLOSILICATE 10 G PO PACK
10.0000 g | PACK | Freq: Once | ORAL | Status: AC
  Administered 2020-01-29: 10 g via ORAL
  Filled 2020-01-29: qty 1

## 2020-01-29 MED ORDER — LOPERAMIDE HCL 2 MG PO CAPS: 2.0000 mg | ORAL_CAPSULE | Freq: Every morning | ORAL | Status: DC

## 2020-01-29 MED ORDER — INSULIN ASPART 100 UNIT/ML IV SOLN
10.0000 [IU] | Freq: Once | INTRAVENOUS | Status: AC
  Administered 2020-01-29: 10 [IU] via INTRAVENOUS
  Filled 2020-01-29: qty 0.1

## 2020-01-29 MED ORDER — INSULIN ASPART 100 UNIT/ML IV SOLN
5.0000 [IU] | Freq: Once | INTRAVENOUS | Status: AC
  Administered 2020-01-29: 5 [IU] via INTRAVENOUS
  Filled 2020-01-29: qty 0.05

## 2020-01-29 NOTE — Progress Notes (Signed)
Physical Therapy Treatment Patient Details Name: Gregory Moody MRN: [id number] DOB: [birth year] Today's Date: 01/29/2020    History of Present Illness Gregory Moody is a 44 y.o. male with medical history significant of diabetes mellitus, GERD, gastroparesis, DKA, anemia, tobacco abuse, esophageal candidiasis, COVID-19 infection 04/23/2019, recent C. difficile colitis, who presents with diarrhea.  Of note: Pt with active APS case regarding concerns over living conditions noted on past admission in March    PT Comments    Pt in bed upon entry, denies pain, reports to feel good in general. Pt agreeable to PT session, has been AMB independently in the room. Pt performs bed mobility and transfers independently this date. AMB performed with modified independence, uses IV pole out of necessity for IV integrity. Pt moving well in general, denies any concerns with mobility at DC. Pt denies any concerns with navigating entry steps at home at DC. Pt mobilizing in room independently, no additional skilled PT services needed at this time. PT signing off. Please consult again if pt has a functional decline.    Follow Up Recommendations  Home health PT;Supervision - Intermittent     Equipment Recommendations  3in1 (PT)    Recommendations for Other Services       Precautions / Restrictions Precautions Precautions: Fall Restrictions Weight Bearing Restrictions: No    Mobility  Bed Mobility Overal bed mobility: Independent                Transfers Overall transfer level: Independent                  Ambulation/Gait Ambulation/Gait assistance: Modified independent (Device/Increase time) Gait Distance (Feet): 120 Feet Assistive device: IV Pole Gait Pattern/deviations: WFL(Within Functional Limits)     General Gait Details: moves well, no apparent instablity.   Stairs             Wheelchair Mobility    Modified Rankin (Stroke Patients Only)        Balance Overall balance assessment: No apparent balance deficits (not formally assessed);Independent                                          Cognition Arousal/Alertness: Awake/alert Behavior During Therapy: WFL for tasks assessed/performed Overall Cognitive Status: Within Functional Limits for tasks assessed                                 General Comments: Pt is A and O x 4       Exercises      General Comments        Pertinent Vitals/Pain Pain Assessment: No/denies pain    Home Living                      Prior Function            PT Goals (current goals can now be found in the care plan section) Acute Rehab PT Goals Patient Stated Goal: to stop his diarrhea PT Goal Formulation: With patient Time For Goal Achievement: 02/06/20 Potential to Achieve Goals: Good Progress towards PT goals: Goals met/education completed, patient discharged from PT    Frequency    Min 2X/week      PT Plan Current plan remains appropriate    Co-evaluation  AM-PAC PT "6 Clicks" Mobility   Outcome Measure  Help needed turning from your back to your side while in a flat bed without using bedrails?: None Help needed moving from lying on your back to sitting on the side of a flat bed without using bedrails?: None Help needed moving to and from a bed to a chair (including a wheelchair)?: None Help needed standing up from a chair using your arms (e.g., wheelchair or bedside chair)?: None Help needed to walk in hospital room?: None Help needed climbing 3-5 steps with a railing? : A Little 6 Click Score: 23    End of Session   Activity Tolerance: Patient tolerated treatment well;No increased pain Patient left: in bed;with call bell/phone within reach Nurse Communication: Mobility status PT Visit Diagnosis: Unsteadiness on feet (R26.81);Muscle weakness (generalized) (M62.81)     Time: 0919-8022 PT Time Calculation  (min) (ACUTE ONLY): 12 min  Charges:  $Therapeutic Exercise: 8-22 mins                     10:30 AM, 01/29/20 Etta Grandchild, PT, DPT Physical Therapist - [facility]  [phone] ([place])    Gregory Moody C 01/29/2020, 10:27 AM

## 2020-01-29 NOTE — Progress Notes (Signed)
OT Cancellation Note  Patient Details Name: Gregory Moody MRN: [id number] DOB: [birth year]   Cancelled Treatment:    Reason Eval/Treat Not Completed: Patient declined, no reason specified;Other (comment). OT attempted to see pt for tx session this date. Upon arrival to pt room, pt on [facility]. Pleasantly declines OT services at this time. Requests therapist attempt later in the pm. Will re-attempt at later time/date as available and pt medically appropriate for tx session.  Shara Blazing, M.S., OTR/L Ascom: [phone] 01/29/20, 9:27 AM

## 2020-01-29 NOTE — Progress Notes (Signed)
Nutrition Follow Up Note   DOCUMENTATION CODES:   Severe malnutrition in context of social or environmental circumstances, Underweight  INTERVENTION:   Continue Ensure Max Protein po BID, each supplement provides 150 kcal and 30 grams of protein. Patient prefers vanilla.  Pt provided with brief diet education on 4/14  NUTRITION DIAGNOSIS:   Severe Malnutrition related to social / environmental circumstances(inadequate oral intake, diarrhea) as evidenced by severe fat depletion, severe muscle depletion.  GOAL:   Patient will meet greater than or equal to 90% of their needs, Weight gain  MONITOR:   PO intake, Supplement acceptance, Labs, Weight trends, I & O's  ASSESSMENT:   44 year old male with PMHx of DM, gastroparesis, hx TB, hx COVID 04/23/2019, recent C. difficile colitis admitted with diarrhea, AKI.   Pt with improved appetite and oral intake; pt eating 100% of meals and drinking supplements. Per chart, pt with weight gain since admit but appears to be closer to his UBW.   Pt provided with brief diabetic diet education on 4/14. Pt is well known to nutrition department and has had prior educations. Pt provided with "Carbohydrate Counting for People with Diabetes" handout in Spanish from the Academy of Nutrition and Dietetics with patient. Pt is not appropriate for full diet education at this time. From review of diet recall patient is not eating carbohydrates in excess of what is recommended on carbohydrate modified diet. Patient is severely malnourished and has been working on increasing intake of calories and protein. This is the main nutrition priority at this time.  Medications reviewed and include: acidophilus, aspirin, lovenox, insulin, imodium, MVI, nicotine, carafate, NaCl @50ml /hr  Labs reviewed: Na 134(L), K 5.3(H), BUN 38(H) Hgb 7.8(L), Hct 23.4(L) cbgs- 227, 334, 257, 311, 167 x 24 hrs  Diet Order:   Diet Order            Diet Carb Modified Fluid consistency:  Thin; Room service appropriate? Yes  Diet effective now             EDUCATION NEEDS:   Education needs have been addressed  Skin:  Skin Assessment: Reviewed RN Assessment  Last BM:  4/20- type 7  Height:   Ht Readings from Last 1 Encounters:  01/25/20 5\' 7"  (1.702 m)   Weight:   Wt Readings from Last 1 Encounters:  01/29/20 55.1 kg   [place]deal Body Weight:  67.3 kg  BMI:  Body mass index is 19.03 kg/m.  Estimated Nutritional Needs:   Kcal:  1800-2000  Protein:  90-100 grams  Fluid:  1.5-1.8 L/day  Koleen Distance MS, RD, LDN Please refer to [facility] for RD and/or RD on-call/weekend/after hours pager

## 2020-01-29 NOTE — Progress Notes (Signed)
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 difficile colitis,who presents with diarrhea.  Repeated stool negative for C. difficile toxin antigen, gastric panel negative also. Is very cachectic, with generalized weaknesses  Recently hospitalized from 3/18-3/25 due to DKA, esophageal candidiasis and C. difficile colitis. Patient was treated with fluconazole and oral vancomycin for 10 days.  On admission he was hypotensive with a blood pressure of 85/65 improved with 2 L NS IV fluid to 122/91,    Consultants:   GI  Procedures:  S/p colonoscopy  Antimicrobials: Antibiotics for 01/21/2020 p.o. vancomycin, ciprofloxacin till 01/22/2020    Subjective: Much less diarrhea. Toelrating feeding.  Objective: Vitals:   01/29/20 0500 01/29/20 0600 01/29/20 0806 01/29/20 1208  BP:   102/76 116/82  Pulse:   86 81  Resp: 12 15 18 18   Temp:   97.7 F (36.5 C)   TempSrc:   Oral   SpO2:   98% 100%  Weight:      Height:        Intake/Output Summary (Last 24 hours) at 01/29/2020 1228 Last data filed at 01/29/2020 2353 Gross per 24 hour  Intake 1002.8 ml  Output 0 ml  Net 1002.8 ml   Filed Weights   01/27/20 0415 01/28/20 0338 01/29/20 0421  Weight: 48.1 kg 52.4 kg 55.1 kg    Examination:  General exam: Appears calm and comfortable, nad, sitting in bed Respiratory system: Clear to auscultation. Respiratory effort normal.  No wheeze ,rales, rhonchi  cardiovascular system: S1 & S2 heard, RRR. No JVD, murmurs, rubs, gallops or clicks.  Gastrointestinal system: Abdomen is soft ,nondistended,nontender.  Normal bowel sounds heard.  No  guarding Central nervous system: Alert and oriented.  Grossly intact Extremities: No edema Skin: Warm dry Psychiatry Mood & affect appropriate in current setting.     Data Reviewed: I have personally reviewed following labs and imaging studies  CBC: Recent Labs  Lab 01/23/20 0530 01/24/20 0622 01/25/20 0613 01/26/20 0612 01/27/20 0427  WBC 5.4 5.4 4.8 7.4 7.9  HGB 7.6* 8.8* 8.3* 8.4* 7.8*  HCT 23.0* 25.6* 24.6* 25.5* 23.4*  MCV 93.1 90.1 91.4 93.4 92.5  PLT 374 407* 407* 418* 614   Basic Metabolic Panel: Recent Labs  Lab 01/23/20 0530 01/23/20 0556 01/24/20 0622 01/24/20 0622 01/25/20 4315 01/25/20 4008 01/26/20 0612 01/26/20 0612 01/27/20 0427 01/27/20 1426 01/28/20 0613 01/29/20 0547 01/29/20 1055  NA   < >  --  135  --  135  --  131*  --  131*  --  134*  --   --   K   < >  --  4.3   < > 4.7   < > 4.5   < > 5.4* 5.8* 5.1 5.3* 5.7*  CL   < >  --  111  --  111  --  105  --  110  --  106  --   --   CO2   < >  --  18*  --  20*  --  21*  --  18*  --  24  --   --   GLUCOSE   < >  --  164*  --  120*  --  223*  --  233*  --  153*  --   --   BUN   < >  --  19  --  12  --  11  --  19  --  38*  --   --   CREATININE   < >  --  0.96  --  0.88  --  0.92  --  0.97  --  1.00  --   --   CALCIUM   < >  --  8.6*  --  8.7*  --  8.7*  --  8.6*  --  9.0  --   --   MG  --  1.8  --   --   --   --   --   --   --   --   --   --   --    < > = values in this interval not displayed.   GFR: Estimated Creatinine Clearance: 73.5 mL/min (by C-G formula based on SCr of 1 mg/dL). Liver Function Tests: No results for input(s): AST, ALT, ALKPHOS, BILITOT, PROT, ALBUMIN in the last 168 hours. No results for input(s): LIPASE, AMYLASE in the last 168 hours. No results for input(s): AMMONIA in the last 168 hours. Coagulation Profile: No results for input(s): INR, PROTIME in the last 168 hours. Cardiac Enzymes: No results for input(s): CKTOTAL, CKMB, CKMBINDEX, TROPONINI in the last 168  hours. BNP (last 3 results) No results for input(s): PROBNP in the last 8760 hours. HbA1C: No results for input(s): HGBA1C in the last 72 hours. CBG: Recent Labs  Lab 01/28/20 2018 01/28/20 2357 01/29/20 0427 01/29/20 0808 01/29/20 1205  GLUCAP 334* 257* 311* 167* 181*   Lipid Profile: No results for input(s): CHOL, HDL, LDLCALC, TRIG, CHOLHDL, LDLDIRECT in the last 72 hours. Thyroid Function Tests: No results for input(s): TSH, T4TOTAL, FREET4, T3FREE, THYROIDAB in the last 72 hours. Anemia Panel: No results for input(s): VITAMINB12, FOLATE, FERRITIN, TIBC, IRON, RETICCTPCT in the last 72 hours. Sepsis Labs: No results for input(s): PROCALCITON, LATICACIDVEN in the last 168 hours.  Recent Results (from the past 240 hour(s))  Respiratory Panel by RT PCR (Flu A&B, Covid) - Nasopharyngeal Swab     Status: None   Collection Time: 01/21/20  3:55 PM   Specimen: Nasopharyngeal Swab  Result Value Ref Range Status   SARS Coronavirus 2 by RT PCR NEGATIVE NEGATIVE Final    Comment: (NOTE) SARS-CoV-2 target nucleic acids are NOT DETECTED. The SARS-CoV-2 RNA is generally detectable in upper respiratoy specimens during the acute phase of infection. The lowest concentration of SARS-CoV-2 viral copies this assay can detect is 131 copies/mL. A negative result does not preclude SARS-Cov-2 infection and should not be used as the sole basis for treatment or other patient management decisions. A negative result may occur with  improper specimen collection/handling, submission of specimen other than nasopharyngeal swab, presence of viral mutation(s) within the areas targeted by this assay, and inadequate number of viral copies (<131 copies/mL). A negative result must be combined with clinical observations, patient history, and epidemiological information. The expected result is Negative. Fact Sheet for Patients:  PinkCheek.be Fact Sheet for Healthcare Providers:   GravelBags.it This test is not yet ap proved or cleared by the [place] FDA and  has been authorized for detection and/or diagnosis of SARS-CoV-2 by FDA under an Emergency Use Authorization (EUA). This EUA will remain  in effect (meaning this test can be used) for the duration of the COVID-19 declaration under  Section 564(b)(1) of the Act, 21 U.S.C. section 360bbb-3(b)(1), unless the authorization is terminated or revoked sooner.    Influenza A by PCR NEGATIVE NEGATIVE Final   Influenza B by PCR NEGATIVE NEGATIVE Final    Comment: (NOTE) The Xpert Xpress SARS-CoV-2/FLU/RSV assay is intended as an aid in  the diagnosis of influenza from Nasopharyngeal swab specimens and  should not be used as a sole basis for treatment. Nasal washings and  aspirates are unacceptable for Xpert Xpress SARS-CoV-2/FLU/RSV  testing. Fact Sheet for Patients: PinkCheek.be Fact Sheet for Healthcare Providers: GravelBags.it This test is not yet approved or cleared by the [place] FDA and  has been authorized for detection and/or diagnosis of SARS-CoV-2 by  FDA under an Emergency Use Authorization (EUA). This EUA will remain  in effect (meaning this test can be used) for the duration of the  Covid-19 declaration under Section 564(b)(1) of the Act, 21  U.S.C. section 360bbb-3(b)(1), unless the authorization is  terminated or revoked. Performed at [facility], [address] [place], [place] [address]   Culture, blood (Routine X 2) w Reflex to ID Panel     Status: None   Collection Time: 01/21/20  4:54 PM   Specimen: BLOOD  Result Value Ref Range Status   Specimen Description BLOOD BLOOD RIGHT ARM  Final   Special Requests   Final    BOTTLES DRAWN AEROBIC AND ANAEROBIC Blood Culture adequate volume   Culture   Final    NO GROWTH 5 DAYS Performed at [facility], [address]  [place], [place] [address]    Report Status 01/26/2020 FINAL  Final  Culture, blood (Routine X 2) w Reflex to ID Panel     Status: None   Collection Time: 01/21/20  4:54 PM   Specimen: BLOOD  Result Value Ref Range Status   Specimen Description BLOOD BLOOD RIGHT ARM  Final   Special Requests   Final    BOTTLES DRAWN AEROBIC AND ANAEROBIC Blood Culture adequate volume   Culture   Final    NO GROWTH 5 DAYS Performed at [facility], [address] [place], [place] [address]    Report Status 01/26/2020 FINAL  Final  Gastrointestinal Panel by PCR , Stool     Status: None   Collection Time: 01/21/20 10:00 PM   Specimen: Stool  Result Value Ref Range Status   Campylobacter species NOT DETECTED NOT DETECTED Final   Plesimonas shigelloides NOT DETECTED NOT DETECTED Final   Salmonella species NOT DETECTED NOT DETECTED Final   Yersinia enterocolitica NOT DETECTED NOT DETECTED Final   Vibrio species NOT DETECTED NOT DETECTED Final   Vibrio cholerae NOT DETECTED NOT DETECTED Final   Enteroaggregative E coli (EAEC) NOT DETECTED NOT DETECTED Final   Enteropathogenic E coli (EPEC) NOT DETECTED NOT DETECTED Final   Enterotoxigenic E coli (ETEC) NOT DETECTED NOT DETECTED Final   Shiga like toxin producing E coli (STEC) NOT DETECTED NOT DETECTED Final   Shigella/Enteroinvasive E coli (EIEC) NOT DETECTED NOT DETECTED Final   Cryptosporidium NOT DETECTED NOT DETECTED Final   Cyclospora cayetanensis NOT DETECTED NOT DETECTED Final   Entamoeba histolytica NOT DETECTED NOT DETECTED Final   Giardia lamblia NOT DETECTED NOT DETECTED Final   Adenovirus F40/41 NOT DETECTED NOT DETECTED Final   Astrovirus NOT DETECTED NOT DETECTED Final   Norovirus GI/GII NOT DETECTED NOT DETECTED Final   Rotavirus A NOT DETECTED NOT DETECTED Final   Sapovirus (I, II, IV, and V)  NOT DETECTED NOT DETECTED Final    Comment: Performed at [facility] [place], [place] [address]  C Difficile  Quick Screen w PCR reflex     Status: None   Collection Time: 01/21/20 10:00 PM   Specimen: STOOL  Result Value Ref Range Status   C Diff antigen NEGATIVE NEGATIVE Final   C Diff toxin NEGATIVE NEGATIVE Final   C Diff interpretation No C. difficile detected.  Final    Comment: Performed at [facility], [address], [place], [place] [address]         Radiology Studies: No results found.      Scheduled Meds: . acidophilus  1 capsule Oral TID  . aspirin EC  81 mg Oral Daily  . colesevelam  1,875 mg Oral BID WC  . diphenoxylate-atropine  10 mL Oral Once  . enoxaparin (LOVENOX) injection  40 mg Subcutaneous Q24H  . FLUoxetine  10 mg Oral Daily  . insulin aspart  0-9 Units Subcutaneous Q4H  . insulin aspart  10 Units Intravenous Once  . insulin glargine  10 Units Subcutaneous QHS  . multivitamin with minerals  1 tablet Oral Daily  . nicotine  21 mg Transdermal Daily  . sucralfate  1 g Oral TID WC & HS   Continuous Infusions: . sodium chloride 50 mL/hr at 01/29/20 0621    Assessment & Plan:   Principal Problem:   Diarrhea Active Problems:   GERD (gastroesophageal reflux disease)   AKI (acute kidney injury) ([place])   Hypotension   Anemia of chronic disease   Protein-calorie malnutrition, severe   Tobacco abuse   C. difficile colitis   Urinary retention   Diabetes mellitus without complication (HCC)   Diarrhea: -Monitoring closely -nonbilious nonbloody Recently hospitalized from 3/18-3/25 due to DKA, esophageal candidiasis and C. difficile colitis. Patient was treated with fluconazole and oral vancomycin for 10 days.  Repeat stool study for C. difficile was negative toxin was negative,  -GI pathogen were all negative -Patient was initiated on admission on p.o. vancomycin and ciprofloxacin both were DC'd -Lactobacillus added to the regimen Negative hepatitis panel, HIV screening GI following-status post colonoscopy with normal colon Biopsies from  colonoscopy and subsequent stool studies to rule out chronic infections, EPI, carcinoid, and protein losing enteropathy pending Stool studies are negative Continue Welchol 1.875g PO BID. Close GI outpatient follow-up upon discharge Plan: D/c loperamide Continue welchol  Recent history of C. difficile colitis --Repeat stool studies with toxins antigen negative -Withholding p.o. vancomycin  GERD (gastroesophageal reflux disease): -Protonix --will be DC'd as it would increase his chance of recurrent C. difficile  AKI (acute kidney injury) ([place]):  Likely due to dehydration, resolved.   Hyperkalemia-continues to be elevated. Given insulin 5units, k now 5.7...> will give Lomotil and insulin 10 units, reck this evening Place on low K diet   Hypotension:  Likely due to dehydration. Patient does NOT have fever or leukocytosis, and lactic acid is normal. Does not seem to have sepsis Resolving. Will continue ivf at 73ml /hr for now until tomorrow  Diabetes mellitus without complication: Most recent A1c15.0, poorly controled.  Patient is takingNovoLog and Lantusat home -10 units nightly -Diabetic coordinator consulted,  -CBG QA CHS, SSI   Anemia of chronic disease: Hemoglobin 8.7 down from recent 10.0. No active bleeding -Follow-up with CBC  Protein-calorie malnutrition, severe -Start Ensure  Tobacco abuse -Nicotine patch  Urinary retention: -In and out Foley catheter was done  DVT prophylaxis: Lovenox subcu Code Status: Full Family Communication: None at bedside Disposition Plan: Back home Barrier: patient now with higher potassium level , keeps increasing. Giving medications to control it. If k improves can dc tomorrow. Also still low bp, needs more ivf.       LOS: 8 days   Time spent: 45 min with more than 50% COC    Nolberto Hanlon, MD [facility] Pager [phone]  If 7PM-7AM, please contact  night-coverage [email or web address] Password [facility] 01/29/2020, 12:28 PM Patient ID: Ilan Kahrs, male   DOB: [birth year], 44 y.o.   MRN: [id number] Patient ID: Marcus Schwandt, male   DOB: 07-Jun-1976, 44 y.o.   MRN: [id number]

## 2020-01-29 NOTE — Clinical Social Work Note (Signed)
CSW has called APS worker awaiting call back.   [place], [place]

## 2020-01-30 LAB — GLUCOSE, CAPILLARY
Glucose-Capillary: 335 mg/dL — ABNORMAL HIGH (ref 70–99)
Glucose-Capillary: 214 mg/dL — ABNORMAL HIGH (ref 70–99)
Glucose-Capillary: 230 mg/dL — ABNORMAL HIGH (ref 70–99)

## 2020-01-30 LAB — BASIC METABOLIC PANEL
Anion gap: 5 (ref 5–15)
BUN: 57 mg/dL — ABNORMAL HIGH (ref 6–20)
CO2: 23 mmol/L (ref 22–32)
Calcium: 8.8 mg/dL — ABNORMAL LOW (ref 8.9–10.3)
Chloride: 103 mmol/L (ref 98–111)
Creatinine, Ser: 1.26 mg/dL — ABNORMAL HIGH (ref 0.61–1.24)
GFR calc Af Amer: >60 mL/min (ref >60)
GFR calc non Af Amer: >60 mL/min (ref >60)
Glucose, Bld: 356 mg/dL — ABNORMAL HIGH (ref 70–99)
Potassium: 5 mmol/L (ref 3.5–5.1)
Sodium: 131 mmol/L — ABNORMAL LOW (ref 135–145)

## 2020-01-30 MED ORDER — COLESEVELAM HCL 625 MG PO TABS: 1875.0000 mg | ORAL_TABLET | Freq: Two times a day (BID) | ORAL | 3 refills | Status: DC

## 2020-01-30 MED ORDER — SODIUM CHLORIDE 0.9 % IV BOLUS
1000.0000 mL | Freq: Once | INTRAVENOUS | Status: AC
  Administered 2020-01-30: 1000 mL via INTRAVENOUS

## 2020-01-30 MED ORDER — INSULIN GLARGINE 100 UNIT/ML ~~LOC~~ SOLN
20.0000 [IU] | Freq: Two times a day (BID) | SUBCUTANEOUS | Status: DC
  Administered 2020-01-30: 20 [IU] via SUBCUTANEOUS
  Filled 2020-01-30 (×2): qty 0.2

## 2020-01-30 MED ORDER — INSULIN GLARGINE 100 UNIT/ML ~~LOC~~ SOLN: 20.0000 [IU] | Freq: Every day | SUBCUTANEOUS | 11 refills | Status: DC

## 2020-01-30 MED ORDER — INSULIN ASPART 100 UNIT/ML ~~LOC~~ SOLN: 6.0000 [IU] | Freq: Three times a day (TID) | SUBCUTANEOUS | 11 refills | Status: DC

## 2020-01-30 MED ORDER — SODIUM CHLORIDE 0.9 % IV SOLN: INTRAVENOUS | Status: DC

## 2020-01-30 NOTE — Progress Notes (Addendum)
Inpatient Diabetes Program Recommendations  AACE/ADA: New Consensus Statement on Inpatient Glycemic Control (2015)  Target Ranges:  Prepandial:   less than 140 mg/dL      Peak postprandial:   less than 180 mg/dL (1-2 hours)      Critically ill patients:  140 - 180 mg/dL   Lab Results  Component Value Date   GLUCAP 230 (H) 01/30/2020   HGBA1C 15.0 (H) 12/29/2019    Review of Glycemic Control Results for Gregory Moody, Gregory Moody (MRN [id number]) as of 01/30/2020 12:48  Ref. Range 01/29/2020 20:25 01/29/2020 23:03 01/30/2020 05:31 01/30/2020 08:16 01/30/2020 11:08  Glucose-Capillary Latest Ref Range: 70 - 99 mg/dL 317 (H) 402 (H) 335 (H) 214 (H) 230 (H)   Inpatient Diabetes Program Recommendations:  Noted increase in Lantus to 20 units bid. -Add Novolog 4 units tid meal coverage if eats 50% meals Secure chat to Dr. Aileen Fass.  Thank you, [name]. Kaeleigh Westendorf, RN, MSN, CDE  Diabetes Coordinator Inpatient Glycemic Control Team Team Pager [phone] (8am-5pm) 01/30/2020 12:50 PM

## 2020-01-30 NOTE — Clinical Social Work Note (Signed)
[facility] cannot see pt until 4/26. MD aware and ok with it.  Gregory Moody, Gregory Moody

## 2020-01-30 NOTE — Discharge Summary (Signed)
Physician Discharge Summary  Temitope Griffing VQM:[id number] DOB: 1976/04/15 DOA: 01/21/2020  PCP: [facility] date: 01/21/2020 Discharge date: 01/30/2020  Admitted From: home Disposition:  Home  Recommendations for Outpatient Follow-up:  1. Follow up with Gi in 1-2 weeks follow-up biopsy results. 2. Please obtain BMP/CBC in one week   Home Health:No Equipment/Devices:None  Discharge Condition:Stable CODE STATUS:Full Diet recommendation: Heart Healthy   Brief/Interim Summary: 44 y.o. male past medical history significant of uncontrolled diabetes mellitus with gastroparesis multiple episodes of DKA recently discharged from the hospital 01/03/2020 for DKA, GERD esophageal candidiasis, remote history of tuberculosis 3 years prior to admission, with a recent C. difficile colitis repeated stool negative C. difficile toxic antigen was negative comes in with severe diarrhea.  Discharge Diagnoses:  Principal Problem:   Diarrhea Active Problems:   GERD (gastroesophageal reflux disease)   AKI (acute kidney injury) ([place])   Hypotension   Anemia of chronic disease   Protein-calorie malnutrition, severe   Tobacco abuse   C. difficile colitis   Urinary retention   Diabetes mellitus without complication (HCC) Diarrhea of unclear etiology: He was recently treated for C. difficile colitis he relates he completed his 10-day course at home repeated stool C. difficile PCR was negative GI pathogen test in-house was negative, he was started empirically for C. difficile colitis with oral vancomycin admission which was DC'd immediately. His hepatitis panel and HIV were negative. GI was consulted who perform a colonoscopy that was unremarkable biopsy results were ordered which are pending, GI recommended follow-up with them as an outpatient.  He was started on loperamide and his diarrhea resolved. He will go home on WelChol.  Recent history of C. difficile colitis: No  further diarrhea C. difficile test negative.  GERD: Protonix was discontinued as he is at high risk of developing C. difficile he denies any reflux.  Acute kidney injury: Likely prerenal azotemia in the setting of hyperglycemia and diarrhea, resolved with IV fluid hydration.  Hyperkalemia: Improved with IV fluid hydration.  Uncontrolled diabetes mellitus type 2 with hyperglycemia: With an A1c of 15 he was started on long-acting insulin and sliding scale and is blood glucose was okay controlled in house. His insulin regimen was increased his Lantus was doubled and also his meal coverage follow-up with the PCP as an outpatient.  Patient has been instructed to keep a CBG log.  Anemia of chronic disease: No signs of active bleeding follow-up with PCP as an outpatient.  Severe protein caloric malnutrition noted.  Urinary retention: Resolved he did require intermittent catheterization.  Left elbow pressure ulcer stage II present on admission treated conservatively.   Discharge Instructions  Discharge Instructions    Diet - low sodium heart healthy   Complete by: As directed    Increase activity slowly   Complete by: As directed      Allergies as of 01/30/2020   No Known Allergies     Medication List    STOP taking these medications   pantoprazole 40 MG tablet Commonly known as: PROTONIX   vancomycin 125 MG capsule Commonly known as: VANCOCIN     TAKE these medications   aspirin EC 81 MG tablet Take 81 mg by mouth daily.   colesevelam 625 MG tablet Commonly known as: WELCHOL Take 3 tablets (1,875 mg total) by mouth 2 (two) times daily with a meal.   fluconazole 200 MG tablet Commonly known as: DIFLUCAN Take 1 tablet (200 mg total) by mouth daily.   FLUoxetine 10  MG capsule Commonly known as: PROZAC Take 10 mg by mouth daily.   insulin aspart 100 UNIT/ML injection Commonly known as: novoLOG Inject 6 Units into the skin 3 (three) times daily with meals. What  changed: how much to take   insulin glargine 100 UNIT/ML injection Commonly known as: LANTUS Inject 0.2 mLs (20 Units total) into the skin at bedtime. What changed: how much to take   loperamide 2 MG capsule Commonly known as: IMODIUM Take 2 mg by mouth as needed for diarrhea or loose stools.   multivitamin with minerals Tabs tablet Take 1 tablet by mouth daily.   sucralfate 1 g tablet Commonly known as: CARAFATE Take 1 tablet (1 g total) by mouth 4 (four) times daily -  with meals and at bedtime.       No Known Allergies  Consultations:  Gi   Procedures/Studies: CT ABDOMEN PELVIS WO CONTRAST  Result Date: 01/21/2020 CLINICAL DATA:  Abdominal pain with diarrhea. Recent history of C difficile infection. EXAM: CT ABDOMEN AND PELVIS WITHOUT CONTRAST TECHNIQUE: Multidetector CT imaging of the abdomen and pelvis was performed following the standard protocol without IV contrast. COMPARISON:  CT abdomen and pelvis 04/17/2019. Chest CT 12/29/2019. FINDINGS: Lower chest: Motion artifact through the lung bases with partially visualized micronodularity in the lower lobes and focal nodular opacity in the lateral aspect of the lingula, overall substantially improved from the recent prior chest CT. No pleural effusion. Hepatobiliary: Punctate calcification in the posterior right hepatic lobe. Unremarkable gallbladder. No biliary dilatation. Pancreas: Unremarkable. Spleen: Unremarkable. Adrenals/Urinary Tract: Unremarkable adrenal glands. No evidence of renal mass, calculi, or hydronephrosis. Markedly distended bladder. Stomach/Bowel: The stomach is mildly distended by ingested material. Relative lack of significant small or large bowel distension limits assessment for wall thickening. There is the suggestion of mild rectal wall thickening with mild haziness in the surrounding fat. There is no evidence of bowel obstruction. The appendix is unremarkable. Vascular/Lymphatic: Normal caliber of the  abdominal aorta. No enlarged lymph nodes. Reproductive: Unremarkable prostate. Other: No ascites or pneumoperitoneum. Musculoskeletal: Remote fractures of the left ischium and proximal left femur. Suspected old healed L5 pars defects with grade 1 anterolisthesis of L5 on S1. IMPRESSION: 1. Markedly distended bladder. 2. Mild rectal wall thickening which may reflect proctitis. 3. Improved nodularity in the lung bases since a 12/29/2019 chest CT. Electronically Signed   By: Logan Bores M.D.   On: 01/21/2020 13:38   DG ESOPHAGUS W SINGLE CM (SOL OR THIN BA)  Result Date: 12/31/2019 CLINICAL DATA:  Odynophagia. Trouble swallowing for 3 weeks. EXAM: ESOPHOGRAM / BARIUM SWALLOW / BARIUM TABLET STUDY TECHNIQUE: Combined double contrast and single contrast examination performed using effervescent crystals, thick barium liquid, and thin barium liquid. The patient was observed with fluoroscopy swallowing a 13 mm barium sulphate tablet. FLUOROSCOPY TIME:  Fluoroscopy Time:  48 seconds Radiation Exposure Index (if provided by the fluoroscopic device): 3.4 mGy Number of Acquired Spot Images: 0 COMPARISON:  None. FINDINGS: Normal pharyngeal anatomy and motility. Contrast flowed freely through the esophagus without evidence of a stricture or mass. Prominent longitudinal a oriented filling defects likely reflecting mucosal plaques with severe irregularity of the mucosa. Shaggy appearance of the esophagus likely reflecting small ulcers. Small pseudodiverticula of the esophagus. Esophageal motility was normal. Moderate severity gastroesophageal reflux. No definite hiatal hernia was demonstrated. IMPRESSION: 1. Severe irregularity of the mucosa with longitudinal plaques, with tiny pseudodiverticula and small ulcerations likely reflecting diffuse esophageal candidiasis. 2. Moderate severity gastroesophageal reflux. Electronically Signed  By: Kathreen Devoid   On: 12/31/2019 09:55     Subjective: No complaints feels great wants  to go home.  Discharge Exam: Vitals:   01/30/20 0814 01/30/20 0821  BP: (!) 76/57 (!) 127/44  Pulse: 80 75  Resp: 17 19  Temp: (!) 97.5 F (36.4 C) 98.6 F (37 C)  SpO2: 96%    Vitals:   01/29/20 2027 01/30/20 0533 01/30/20 0814 01/30/20 0821  BP: (!) 84/64 114/85 (!) 76/57 (!) 127/44  Pulse: 85 85 80 75  Resp: 20 18 17 19   Temp: (!) 97.5 F (36.4 C) 98 F (36.7 C) (!) 97.5 F (36.4 C) 98.6 F (37 C)  TempSrc: Oral Oral Oral Oral  SpO2: 100% 100% 96%   Weight:      Height:        General: Pt is alert, awake, not in acute distress Cardiovascular: RRR, S1/S2 +, no rubs, no gallops Respiratory: CTA bilaterally, no wheezing, no rhonchi Abdominal: Soft, NT, ND, bowel sounds + Extremities: no edema, no cyanosis    The results of significant diagnostics from this hospitalization (including imaging, microbiology, ancillary and laboratory) are listed below for reference.     Microbiology: Recent Results (from the past 240 hour(s))  Respiratory Panel by RT PCR (Flu A&B, Covid) - Nasopharyngeal Swab     Status: None   Collection Time: 01/21/20  3:55 PM   Specimen: Nasopharyngeal Swab  Result Value Ref Range Status   SARS Coronavirus 2 by RT PCR NEGATIVE NEGATIVE Final    Comment: (NOTE) SARS-CoV-2 target nucleic acids are NOT DETECTED. The SARS-CoV-2 RNA is generally detectable in upper respiratoy specimens during the acute phase of infection. The lowest concentration of SARS-CoV-2 viral copies this assay can detect is 131 copies/mL. A negative result does not preclude SARS-Cov-2 infection and should not be used as the sole basis for treatment or other patient management decisions. A negative result may occur with  improper specimen collection/handling, submission of specimen other than nasopharyngeal swab, presence of viral mutation(s) within the areas targeted by this assay, and inadequate number of viral copies (<131 copies/mL). A negative result must be combined  with clinical observations, patient history, and epidemiological information. The expected result is Negative. Fact Sheet for Patients:  PinkCheek.be Fact Sheet for Healthcare Providers:  GravelBags.it This test is not yet ap proved or cleared by the [place] FDA and  has been authorized for detection and/or diagnosis of SARS-CoV-2 by FDA under an Emergency Use Authorization (EUA). This EUA will remain  in effect (meaning this test can be used) for the duration of the COVID-19 declaration under Section 564(b)(1) of the Act, 21 U.S.C. section 360bbb-3(b)(1), unless the authorization is terminated or revoked sooner.    Influenza A by PCR NEGATIVE NEGATIVE Final   Influenza B by PCR NEGATIVE NEGATIVE Final    Comment: (NOTE) The Xpert Xpress SARS-CoV-2/FLU/RSV assay is intended as an aid in  the diagnosis of influenza from Nasopharyngeal swab specimens and  should not be used as a sole basis for treatment. Nasal washings and  aspirates are unacceptable for Xpert Xpress SARS-CoV-2/FLU/RSV  testing. Fact Sheet for Patients: PinkCheek.be Fact Sheet for Healthcare Providers: GravelBags.it This test is not yet approved or cleared by the [place] FDA and  has been authorized for detection and/or diagnosis of SARS-CoV-2 by  FDA under an Emergency Use Authorization (EUA). This EUA will remain  in effect (meaning this test can be used) for the duration of the  Covid-19  declaration under Section 564(b)(1) of the Act, 21  U.S.C. section 360bbb-3(b)(1), unless the authorization is  terminated or revoked. Performed at [facility], [address] [place], [place] [address]   Culture, blood (Routine X 2) w Reflex to ID Panel     Status: None   Collection Time: 01/21/20  4:54 PM   Specimen: BLOOD  Result Value Ref Range Status   Specimen Description BLOOD BLOOD  RIGHT ARM  Final   Special Requests   Final    BOTTLES DRAWN AEROBIC AND ANAEROBIC Blood Culture adequate volume   Culture   Final    NO GROWTH 5 DAYS Performed at [facility], [address] [place], [place] [address]    Report Status 01/26/2020 FINAL  Final  Culture, blood (Routine X 2) w Reflex to ID Panel     Status: None   Collection Time: 01/21/20  4:54 PM   Specimen: BLOOD  Result Value Ref Range Status   Specimen Description BLOOD BLOOD RIGHT ARM  Final   Special Requests   Final    BOTTLES DRAWN AEROBIC AND ANAEROBIC Blood Culture adequate volume   Culture   Final    NO GROWTH 5 DAYS Performed at [facility], [address] [place], [place] [address]    Report Status 01/26/2020 FINAL  Final  Gastrointestinal Panel by PCR , Stool     Status: None   Collection Time: 01/21/20 10:00 PM   Specimen: Stool  Result Value Ref Range Status   Campylobacter species NOT DETECTED NOT DETECTED Final   Plesimonas shigelloides NOT DETECTED NOT DETECTED Final   Salmonella species NOT DETECTED NOT DETECTED Final   Yersinia enterocolitica NOT DETECTED NOT DETECTED Final   Vibrio species NOT DETECTED NOT DETECTED Final   Vibrio cholerae NOT DETECTED NOT DETECTED Final   Enteroaggregative E coli (EAEC) NOT DETECTED NOT DETECTED Final   Enteropathogenic E coli (EPEC) NOT DETECTED NOT DETECTED Final   Enterotoxigenic E coli (ETEC) NOT DETECTED NOT DETECTED Final   Shiga like toxin producing E coli (STEC) NOT DETECTED NOT DETECTED Final   Shigella/Enteroinvasive E coli (EIEC) NOT DETECTED NOT DETECTED Final   Cryptosporidium NOT DETECTED NOT DETECTED Final   Cyclospora cayetanensis NOT DETECTED NOT DETECTED Final   Entamoeba histolytica NOT DETECTED NOT DETECTED Final   Giardia lamblia NOT DETECTED NOT DETECTED Final   Adenovirus F40/41 NOT DETECTED NOT DETECTED Final   Astrovirus NOT DETECTED NOT DETECTED Final   Norovirus GI/GII NOT DETECTED NOT DETECTED Final    Rotavirus A NOT DETECTED NOT DETECTED Final   Sapovirus (I, II, IV, and V) NOT DETECTED NOT DETECTED Final    Comment: Performed at [facility], [address] [place], [place] [address]  C Difficile Quick Screen w PCR reflex     Status: None   Collection Time: 01/21/20 10:00 PM   Specimen: STOOL  Result Value Ref Range Status   C Diff antigen NEGATIVE NEGATIVE Final   C Diff toxin NEGATIVE NEGATIVE Final   C Diff interpretation No C. difficile detected.  Final    Comment: Performed at [facility], [address] [place], [place] [address]     Labs: BNP (last 3 results) Recent Labs    04/19/19 0300 04/20/19 0500 04/20/19 1108  BNP 450.9* SPECIMEN(S) LOST IN TRANSIT DUPLICATE  563.8*   Basic Metabolic Panel: Recent Labs  Lab 01/25/20 9373 01/25/20 4287 01/26/20 0612 01/26/20 0612 01/27/20 0427 01/27/20 1426 01/28/20 6811 01/29/20  3267 01/29/20 1055 01/29/20 1821 01/30/20 0619  NA 135  --  131*  --  131*  --  134*  --   --   --  131*  K 4.7   < > 4.5   < > 5.4*   < > 5.1 5.3* 5.7* 5.6* 5.0  CL 111  --  105  --  110  --  106  --   --   --  103  CO2 20*  --  21*  --  18*  --  24  --   --   --  23  GLUCOSE 120*  --  223*  --  233*  --  153*  --   --   --  356*  BUN 12  --  11  --  19  --  38*  --   --   --  57*  CREATININE 0.88  --  0.92  --  0.97  --  1.00  --   --   --  1.26*  CALCIUM 8.7*  --  8.7*  --  8.6*  --  9.0  --   --   --  8.8*   < > = values in this interval not displayed.   Liver Function Tests: No results for input(s): AST, ALT, ALKPHOS, BILITOT, PROT, ALBUMIN in the last 168 hours. No results for input(s): LIPASE, AMYLASE in the last 168 hours. No results for input(s): AMMONIA in the last 168 hours. CBC: Recent Labs  Lab 01/24/20 0622 01/25/20 0613 01/26/20 0612 01/27/20 0427  WBC 5.4 4.8 7.4 7.9  HGB 8.8* 8.3* 8.4* 7.8*  HCT 25.6* 24.6* 25.5* 23.4*  MCV 90.1 91.4 93.4 92.5  PLT 407* 407* 418* 372   Cardiac  Enzymes: No results for input(s): CKTOTAL, CKMB, CKMBINDEX, TROPONINI in the last 168 hours. BNP: Invalid input(s): POCBNP CBG: Recent Labs  Lab 01/29/20 1205 01/29/20 1616 01/29/20 2025 01/29/20 2303 01/30/20 0816  GLUCAP 181* 109* 317* 402* 214*   D-Dimer No results for input(s): DDIMER in the last 72 hours. Hgb A1c No results for input(s): HGBA1C in the last 72 hours. Lipid Profile No results for input(s): CHOL, HDL, LDLCALC, TRIG, CHOLHDL, LDLDIRECT in the last 72 hours. Thyroid function studies No results for input(s): TSH, T4TOTAL, T3FREE, THYROIDAB in the last 72 hours.  Invalid input(s): FREET3 Anemia work up No results for input(s): VITAMINB12, FOLATE, FERRITIN, TIBC, IRON, RETICCTPCT in the last 72 hours. Urinalysis    Component Value Date/Time   COLORURINE YELLOW (A) 12/27/2019 1036   APPEARANCEUR HAZY (A) 12/27/2019 1036   LABSPEC 1.020 12/27/2019 1036   PHURINE 5.0 12/27/2019 1036   GLUCOSEU >=500 (A) 12/27/2019 1036   HGBUR NEGATIVE 12/27/2019 1036   BILIRUBINUR NEGATIVE 12/27/2019 1036   KETONESUR 5 (A) 12/27/2019 1036   PROTEINUR NEGATIVE 12/27/2019 1036   NITRITE NEGATIVE 12/27/2019 1036   LEUKOCYTESUR SMALL (A) 12/27/2019 1036   Sepsis Labs Invalid input(s): PROCALCITONIN,  WBC,  LACTICIDVEN Microbiology Recent Results (from the past 240 hour(s))  Respiratory Panel by RT PCR (Flu A&B, Covid) - Nasopharyngeal Swab     Status: None   Collection Time: 01/21/20  3:55 PM   Specimen: Nasopharyngeal Swab  Result Value Ref Range Status   SARS Coronavirus 2 by RT PCR NEGATIVE NEGATIVE Final    Comment: (NOTE) SARS-CoV-2 target nucleic acids are NOT DETECTED. The SARS-CoV-2 RNA is generally detectable in upper respiratoy specimens during the acute phase of infection. The lowest concentration of  SARS-CoV-2 viral copies this assay can detect is 131 copies/mL. A negative result does not preclude SARS-Cov-2 infection and should not be used as the sole basis  for treatment or other patient management decisions. A negative result may occur with  improper specimen collection/handling, submission of specimen other than nasopharyngeal swab, presence of viral mutation(s) within the areas targeted by this assay, and inadequate number of viral copies (<131 copies/mL). A negative result must be combined with clinical observations, patient history, and epidemiological information. The expected result is Negative. Fact Sheet for Patients:  PinkCheek.be Fact Sheet for Healthcare Providers:  GravelBags.it This test is not yet ap proved or cleared by the [place] FDA and  has been authorized for detection and/or diagnosis of SARS-CoV-2 by FDA under an Emergency Use Authorization (EUA). This EUA will remain  in effect (meaning this test can be used) for the duration of the COVID-19 declaration under Section 564(b)(1) of the Act, 21 U.S.C. section 360bbb-3(b)(1), unless the authorization is terminated or revoked sooner.    Influenza A by PCR NEGATIVE NEGATIVE Final   Influenza B by PCR NEGATIVE NEGATIVE Final    Comment: (NOTE) The Xpert Xpress SARS-CoV-2/FLU/RSV assay is intended as an aid in  the diagnosis of influenza from Nasopharyngeal swab specimens and  should not be used as a sole basis for treatment. Nasal washings and  aspirates are unacceptable for Xpert Xpress SARS-CoV-2/FLU/RSV  testing. Fact Sheet for Patients: PinkCheek.be Fact Sheet for Healthcare Providers: GravelBags.it This test is not yet approved or cleared by the [place] FDA and  has been authorized for detection and/or diagnosis of SARS-CoV-2 by  FDA under an Emergency Use Authorization (EUA). This EUA will remain  in effect (meaning this test can be used) for the duration of the  Covid-19 declaration under Section 564(b)(1) of the Act, 21  U.S.C.  section 360bbb-3(b)(1), unless the authorization is  terminated or revoked. Performed at [facility], [address] [place], [place] [address]   Culture, blood (Routine X 2) w Reflex to ID Panel     Status: None   Collection Time: 01/21/20  4:54 PM   Specimen: BLOOD  Result Value Ref Range Status   Specimen Description BLOOD BLOOD RIGHT ARM  Final   Special Requests   Final    BOTTLES DRAWN AEROBIC AND ANAEROBIC Blood Culture adequate volume   Culture   Final    NO GROWTH 5 DAYS Performed at [facility], [address] [place], [place] [address]    Report Status 01/26/2020 FINAL  Final  Culture, blood (Routine X 2) w Reflex to ID Panel     Status: None   Collection Time: 01/21/20  4:54 PM   Specimen: BLOOD  Result Value Ref Range Status   Specimen Description BLOOD BLOOD RIGHT ARM  Final   Special Requests   Final    BOTTLES DRAWN AEROBIC AND ANAEROBIC Blood Culture adequate volume   Culture   Final    NO GROWTH 5 DAYS Performed at [facility], [address], [place], [place] [address]    Report Status 01/26/2020 FINAL  Final  Gastrointestinal Panel by PCR , Stool     Status: None   Collection Time: 01/21/20 10:00 PM   Specimen: Stool  Result Value Ref Range Status   Campylobacter species NOT DETECTED NOT DETECTED Final   Plesimonas shigelloides NOT DETECTED NOT DETECTED Final   Salmonella species NOT DETECTED NOT DETECTED Final   Yersinia enterocolitica NOT DETECTED  NOT DETECTED Final   Vibrio species NOT DETECTED NOT DETECTED Final   Vibrio cholerae NOT DETECTED NOT DETECTED Final   Enteroaggregative E coli (EAEC) NOT DETECTED NOT DETECTED Final   Enteropathogenic E coli (EPEC) NOT DETECTED NOT DETECTED Final   Enterotoxigenic E coli (ETEC) NOT DETECTED NOT DETECTED Final   Shiga like toxin producing E coli (STEC) NOT DETECTED NOT DETECTED Final   Shigella/Enteroinvasive E coli (EIEC) NOT DETECTED NOT DETECTED Final   Cryptosporidium  NOT DETECTED NOT DETECTED Final   Cyclospora cayetanensis NOT DETECTED NOT DETECTED Final   Entamoeba histolytica NOT DETECTED NOT DETECTED Final   Giardia lamblia NOT DETECTED NOT DETECTED Final   Adenovirus F40/41 NOT DETECTED NOT DETECTED Final   Astrovirus NOT DETECTED NOT DETECTED Final   Norovirus GI/GII NOT DETECTED NOT DETECTED Final   Rotavirus A NOT DETECTED NOT DETECTED Final   Sapovirus (I, II, IV, and V) NOT DETECTED NOT DETECTED Final    Comment: Performed at [facility], [address] [place], [place] [address]  C Difficile Quick Screen w PCR reflex     Status: None   Collection Time: 01/21/20 10:00 PM   Specimen: STOOL  Result Value Ref Range Status   C Diff antigen NEGATIVE NEGATIVE Final   C Diff toxin NEGATIVE NEGATIVE Final   C Diff interpretation No C. difficile detected.  Final    Comment: Performed at [facility], [address] [place], [place] [address]     Time coordinating discharge: Over 40 minutes  SIGNED:   Charlynne Cousins, MD  [facility] 01/30/2020, 9:16 AM Pager   If 7PM-7AM, please contact night-coverage [email or web address] Password [email or web address]

## 2020-01-30 NOTE — TOC Initial Note (Signed)
Transition of Care [facility]) - Initial/Assessment Note    Patient Details  Name: Gregory Moody MRN: [id number] Date of Birth: 12-30-1975  Transition of Care [facility]) CM/SW Contact:    Eileen Stanford, LCSW Phone Number: 01/30/2020, 10:47 AM  Clinical Narrative:     CSW spoke with pt at bedside. Pt states he lives with his mom, dad, and son. Pt states he still goes to [facility]. Pt has a Herbalist. CSW has attempted to reach APS worker yesterday- left voicemail, and today - left voicemail with no call back. Pt is agreeable to [facility]. CSW will reach out to [facility] as they are charity. Pt states he will need a taxi home, confirmed address is correct on the chart. Taxi voucher provided to [employer].              Expected Discharge Plan: [place] Barriers to Discharge: No Barriers Identified   Patient Goals and CMS Choice Patient states their goals for this hospitalization and ongoing recovery are:: go hom   Choice offered to / list presented to : Patient  Expected Discharge Plan and Services Expected Discharge Plan: [place] In-house Referral: Clinical Social Work   Post Acute Care Choice: [place] arrangements for the past 2 months: [place] Expected Discharge Date: 01/30/20                         HH Arranged: PT, OT [place] Agency: [place] ([place]) Date HH Agency Contacted: 01/30/20 Time [place]: 44 Representative spoke with at [place]: [name]  Prior Living Arrangements/Services Living arrangements for the past 2 months: [place] with:: Minor Children, Parents Patient language and need for interpreter reviewed:: Yes Do you feel safe going back to the place where you live?: Yes      Need for Family Participation in Patient Care: Yes (Comment) Care giver support system in place?: Yes (comment)   Criminal Activity/Legal Involvement Pertinent to Current  Situation/Hospitalization: No - Comment as needed  Activities of Daily Living Home Assistive Devices/Equipment: CBG Meter, Walker (specify type) ADL Screening (condition at time of admission) Patient's cognitive ability adequate to safely complete daily activities?: Yes Is the patient deaf or have difficulty hearing?: No Does the patient have difficulty seeing, even when wearing glasses/contacts?: No Does the patient have difficulty concentrating, remembering, or making decisions?: No Patient able to express need for assistance with ADLs?: Yes Does the patient have difficulty dressing or bathing?: Yes Independently performs ADLs?: No Communication: Independent Dressing (OT): Needs assistance Is this a change from baseline?: Pre-admission baseline Grooming: Needs assistance Is this a change from baseline?: Pre-admission baseline Feeding: Independent Bathing: Needs assistance Is this a change from baseline?: Pre-admission baseline Toileting: Needs assistance Is this a change from baseline?: Pre-admission baseline In/Out Bed: Needs assistance Is this a change from baseline?: Pre-admission baseline Walks in Home: Needs assistance Is this a change from baseline?: Pre-admission baseline Does the patient have difficulty walking or climbing stairs?: Yes Weakness of Legs: Both Weakness of Arms/Hands: Both  Permission Sought/Granted Permission sought to share information with : Family Supports                Emotional Assessment Appearance:: Appears stated age Attitude/Demeanor/Rapport: Engaged Affect (typically observed): Accepting, Appropriate Orientation: : Oriented to Self, Oriented to Place, Oriented to  Time, Oriented to Situation Alcohol / Substance Use: Not Applicable Psych Involvement: No (comment)  Admission diagnosis:  Diarrhea [R19.7] Urinary retention [R33.9] AKI (acute kidney injury) ([place]) [N17.9] C. difficile colitis [A04.72] Patient Active Problem List    Diagnosis Date Noted  . Diarrhea 01/21/2020  . Tobacco abuse 01/21/2020  . C. difficile colitis 01/21/2020  . Urinary retention 01/21/2020  . Diabetes mellitus without complication ([place]) 07/37/1062  . Protein-calorie malnutrition, severe 01/02/2020  . Odynophagia   . DKA (diabetic ketoacidoses) ([place]) 12/27/2019  . Depression 12/27/2019  . DKA, type 2 ([place]) 12/27/2019  . Anemia of chronic disease 04/25/2019  . Hypomagnesemia 04/25/2019  . Hypotension 04/23/2019  . CAP (community acquired pneumonia) due to MSSA (methicillin sensitive Staphylococcus aureus) ([place]) 04/14/2019  . COVID-19 virus infection 04/14/2019  . Pressure injury of skin 03/31/2019  . Acute renal failure (ARF) ([place]) 03/06/2019  . CAP (community acquired pneumonia) 02/14/2019  . AKI (acute kidney injury) ([place]) 02/02/2019  . ARF (acute renal failure) ([place]) 01/03/2019  . Malnutrition of moderate degree 02/24/2018  . GERD (gastroesophageal reflux disease) 02/23/2018  . Diabetic gastroparesis ([place]) 02/23/2018  . Diabetic foot ulcer ([place]) 02/23/2018  . Aspiration pneumonia ([place]) 04/21/2017  . Hypoglycemia 04/21/2017  . Diabetes mellitus with hyperglycemia ([place]) 04/21/2017  . Hip fracture, unspecified laterality, closed, initial encounter ([place]) 04/17/2017  . Hyperglycemia 04/17/2017  . Malnutrition ([place]) 04/09/2016  . Cavitary lesion of lung 04/06/2016   PCP:  [facility]:   [place], [place] - [address] [place] [address] Phone: [phone] Fax: [phone]  [facility] [address] ([address]), [place] - [address] [place]) [place] [address] Phone: [phone] Fax: [phone]  [facility] [place], [address] [place] [address] Phone: [phone] Fax: [phone]     Social Determinants of Health (SDOH)  Interventions    Readmission Risk Interventions Readmission Risk Prevention Plan 04/19/2019 03/07/2019  Transportation Screening Complete Complete  PCP or Specialist Appt within 3-5 Days - Complete  Social Work Consult for [place] Planning/Counseling - (No Data)  Palliative Care Screening - Not Applicable  Medication Review Press photographer) Complete Complete  PCP or Specialist appointment within 3-5 days of discharge Complete -  [place] or Home Care Consult Complete -  SW Recovery Care/Counseling Consult Complete -  Palliative Care Screening Not Applicable -  [place] Not Applicable -  Some recent data might be hidden

## 2020-01-31 LAB — PANCREATIC ELASTASE, FECAL

## 2020-02-01 LAB — GIARDIA, EIA; OVA/PARASITE: Giardia Ag, Stl: NEGATIVE

## 2020-02-01 LAB — O&P RESULT

## 2020-02-06 ENCOUNTER — Inpatient Hospital Stay
Admission: EM | Admit: 2020-02-06 | Discharge: 2020-02-09 | DRG: 637 | Disposition: A | Discharge status: Home / Self Care | Payer: Self-pay | Attending: Internal Medicine | Admitting: Internal Medicine

## 2020-02-06 ENCOUNTER — Other Ambulatory Visit: Payer: Self-pay

## 2020-02-06 ENCOUNTER — Encounter: Payer: Self-pay | Admitting: Emergency Medicine

## 2020-02-06 DIAGNOSIS — K529 Noninfective gastroenteritis and colitis, unspecified: Secondary | ICD-10-CM

## 2020-02-06 DIAGNOSIS — R531 Weakness: Secondary | ICD-10-CM

## 2020-02-06 DIAGNOSIS — F17200 Nicotine dependence, unspecified, uncomplicated: Secondary | ICD-10-CM | POA: Diagnosis present

## 2020-02-06 DIAGNOSIS — Z8619 Personal history of other infectious and parasitic diseases: Secondary | ICD-10-CM | POA: Insufficient documentation

## 2020-02-06 DIAGNOSIS — E1143 Type 2 diabetes mellitus with diabetic autonomic (poly)neuropathy: Secondary | ICD-10-CM | POA: Diagnosis present

## 2020-02-06 DIAGNOSIS — E860 Dehydration: Secondary | ICD-10-CM | POA: Diagnosis present

## 2020-02-06 DIAGNOSIS — R338 Other retention of urine: Secondary | ICD-10-CM

## 2020-02-06 DIAGNOSIS — R9389 Abnormal findings on diagnostic imaging of other specified body structures: Secondary | ICD-10-CM

## 2020-02-06 DIAGNOSIS — Z7982 Long term (current) use of aspirin: Secondary | ICD-10-CM

## 2020-02-06 DIAGNOSIS — Z833 Family history of diabetes mellitus: Secondary | ICD-10-CM

## 2020-02-06 DIAGNOSIS — R634 Abnormal weight loss: Secondary | ICD-10-CM

## 2020-02-06 DIAGNOSIS — R339 Retention of urine, unspecified: Secondary | ICD-10-CM | POA: Diagnosis present

## 2020-02-06 DIAGNOSIS — D509 Iron deficiency anemia, unspecified: Secondary | ICD-10-CM

## 2020-02-06 DIAGNOSIS — N179 Acute kidney failure, unspecified: Secondary | ICD-10-CM | POA: Diagnosis present

## 2020-02-06 DIAGNOSIS — Z8616 Personal history of COVID-19: Secondary | ICD-10-CM

## 2020-02-06 DIAGNOSIS — R700 Elevated erythrocyte sedimentation rate: Secondary | ICD-10-CM | POA: Diagnosis present

## 2020-02-06 DIAGNOSIS — E43 Unspecified severe protein-calorie malnutrition: Secondary | ICD-10-CM | POA: Diagnosis present

## 2020-02-06 DIAGNOSIS — Z79899 Other long term (current) drug therapy: Secondary | ICD-10-CM

## 2020-02-06 DIAGNOSIS — R64 Cachexia: Secondary | ICD-10-CM | POA: Diagnosis present

## 2020-02-06 DIAGNOSIS — I959 Hypotension, unspecified: Secondary | ICD-10-CM | POA: Diagnosis present

## 2020-02-06 DIAGNOSIS — R911 Solitary pulmonary nodule: Secondary | ICD-10-CM | POA: Diagnosis present

## 2020-02-06 DIAGNOSIS — K3184 Gastroparesis: Secondary | ICD-10-CM | POA: Diagnosis present

## 2020-02-06 DIAGNOSIS — E46 Unspecified protein-calorie malnutrition: Secondary | ICD-10-CM | POA: Diagnosis present

## 2020-02-06 DIAGNOSIS — L989 Disorder of the skin and subcutaneous tissue, unspecified: Secondary | ICD-10-CM | POA: Diagnosis present

## 2020-02-06 DIAGNOSIS — Z6820 Body mass index (BMI) 20.0-20.9, adult: Secondary | ICD-10-CM

## 2020-02-06 DIAGNOSIS — E1165 Type 2 diabetes mellitus with hyperglycemia: Principal | ICD-10-CM

## 2020-02-06 DIAGNOSIS — R197 Diarrhea, unspecified: Secondary | ICD-10-CM | POA: Diagnosis present

## 2020-02-06 DIAGNOSIS — Z8611 Personal history of tuberculosis: Secondary | ICD-10-CM

## 2020-02-06 DIAGNOSIS — R739 Hyperglycemia, unspecified: Secondary | ICD-10-CM

## 2020-02-06 DIAGNOSIS — L98499 Non-pressure chronic ulcer of skin of other sites with unspecified severity: Secondary | ICD-10-CM | POA: Diagnosis present

## 2020-02-06 LAB — COMPREHENSIVE METABOLIC PANEL
ALT: 62 U/L — ABNORMAL HIGH (ref 0–44)
AST: 23 U/L (ref 15–41)
Albumin: 3.3 g/dL — ABNORMAL LOW (ref 3.5–5.0)
Alkaline Phosphatase: 117 U/L (ref 38–126)
Anion gap: 10 (ref 5–15)
BUN: 33 mg/dL — ABNORMAL HIGH (ref 6–20)
CO2: 18 mmol/L — ABNORMAL LOW (ref 22–32)
Calcium: 8.3 mg/dL — ABNORMAL LOW (ref 8.9–10.3)
Chloride: 97 mmol/L — ABNORMAL LOW (ref 98–111)
Creatinine, Ser: 1.5 mg/dL — ABNORMAL HIGH (ref 0.61–1.24)
GFR calc Af Amer: >60 mL/min (ref >60)
GFR calc non Af Amer: 56 mL/min — ABNORMAL LOW (ref >60)
Glucose, Bld: 661 mg/dL (ref 70–99)
Potassium: 5.1 mmol/L (ref 3.5–5.1)
Sodium: 125 mmol/L — ABNORMAL LOW (ref 135–145)
Total Bilirubin: 0.8 mg/dL (ref 0.3–1.2)
Total Protein: 7.6 g/dL (ref 6.5–8.1)

## 2020-02-06 LAB — BLOOD GAS, VENOUS
Acid-base deficit: 6.6 mmol/L — ABNORMAL HIGH (ref 0.0–2.0)
Bicarbonate: 20.2 mmol/L (ref 20.0–28.0)
O2 Saturation: 53.5 %
Patient temperature: 37
pCO2, Ven: 44 mmHg (ref 44.0–60.0)
pH, Ven: 7.27 (ref 7.250–7.430)
pO2, Ven: 33 mmHg (ref 32.0–45.0)

## 2020-02-06 LAB — URINALYSIS, COMPLETE (UACMP) WITH MICROSCOPIC
Bacteria, UA: NONE SEEN
Bilirubin Urine: NEGATIVE
Glucose, UA: >=500 mg/dL — AB
Hgb urine dipstick: NEGATIVE
Ketones, ur: NEGATIVE mg/dL
Leukocytes,Ua: NEGATIVE
Nitrite: NEGATIVE
Protein, ur: NEGATIVE mg/dL
Specific Gravity, Urine: 1.02 (ref 1.005–1.030)
Squamous Epithelial / HPF: NONE SEEN (ref 0–5)
pH: 5 (ref 5.0–8.0)

## 2020-02-06 LAB — CBC
HCT: 24.7 % — ABNORMAL LOW (ref 39.0–52.0)
Hemoglobin: 7.7 g/dL — ABNORMAL LOW (ref 13.0–17.0)
MCH: 31 pg (ref 26.0–34.0)
MCHC: 31.2 g/dL (ref 30.0–36.0)
MCV: 99.6 fL (ref 80.0–100.0)
Platelets: 372 10*3/uL (ref 150–400)
RBC: 2.48 MIL/uL — ABNORMAL LOW (ref 4.22–5.81)
RDW: 14.4 % (ref 11.5–15.5)
WBC: 9.3 10*3/uL (ref 4.0–10.5)
nRBC: 0 % (ref 0.0–0.2)

## 2020-02-06 LAB — GLUCOSE, CAPILLARY
Glucose-Capillary: >600 mg/dL (ref 70–99)
Glucose-Capillary: 349 mg/dL — ABNORMAL HIGH (ref 70–99)
Glucose-Capillary: 249 mg/dL — ABNORMAL HIGH (ref 70–99)
Glucose-Capillary: 280 mg/dL — ABNORMAL HIGH (ref 70–99)

## 2020-02-06 LAB — LIPASE, BLOOD: Lipase: 42 U/L (ref 11–51)

## 2020-02-06 MED ORDER — INSULIN ASPART 100 UNIT/ML ~~LOC~~ SOLN
0.0000 [IU] | Freq: Every day | SUBCUTANEOUS | Status: DC
  Administered 2020-02-06: 3 [IU] via SUBCUTANEOUS
  Filled 2020-02-06: qty 1

## 2020-02-06 MED ORDER — INSULIN ASPART 100 UNIT/ML ~~LOC~~ SOLN
0.0000 [IU] | Freq: Three times a day (TID) | SUBCUTANEOUS | Status: DC
  Administered 2020-02-07 (×3): 5 [IU] via SUBCUTANEOUS
  Administered 2020-02-08: 09:00:00 7 [IU] via SUBCUTANEOUS
  Filled 2020-02-06 (×4): qty 1

## 2020-02-06 MED ORDER — ENOXAPARIN SODIUM 40 MG/0.4ML ~~LOC~~ SOLN
40.0000 mg | SUBCUTANEOUS | Status: DC
  Administered 2020-02-07: 09:00:00 40 mg via SUBCUTANEOUS
  Filled 2020-02-06: qty 0.4

## 2020-02-06 MED ORDER — SODIUM CHLORIDE 0.9 % IV BOLUS
1000.0000 mL | Freq: Once | INTRAVENOUS | Status: AC
  Administered 2020-02-06: 18:00:00 1000 mL via INTRAVENOUS

## 2020-02-06 MED ORDER — ONDANSETRON HCL 4 MG PO TABS: 4.0000 mg | ORAL_TABLET | Freq: Four times a day (QID) | ORAL | Status: DC | PRN

## 2020-02-06 MED ORDER — METOCLOPRAMIDE HCL 5 MG/ML IJ SOLN
5.0000 mg | Freq: Four times a day (QID) | INTRAMUSCULAR | Status: AC
  Administered 2020-02-06 – 2020-02-07 (×4): 5 mg via INTRAVENOUS
  Filled 2020-02-06 (×4): qty 2

## 2020-02-06 MED ORDER — SODIUM CHLORIDE 0.9 % IV BOLUS
1000.0000 mL | Freq: Once | INTRAVENOUS | Status: AC
  Administered 2020-02-06: 21:00:00 1000 mL via INTRAVENOUS

## 2020-02-06 MED ORDER — SODIUM CHLORIDE 0.9 % IV SOLN: INTRAVENOUS | Status: DC

## 2020-02-06 MED ORDER — SODIUM CHLORIDE 0.9 % IV BOLUS
1000.0000 mL | Freq: Once | INTRAVENOUS | Status: AC
  Administered 2020-02-06: 17:00:00 1000 mL via INTRAVENOUS

## 2020-02-06 MED ORDER — INSULIN ASPART 100 UNIT/ML ~~LOC~~ SOLN
10.0000 [IU] | Freq: Once | SUBCUTANEOUS | Status: AC
  Administered 2020-02-06: 18:00:00 10 [IU] via INTRAVENOUS
  Filled 2020-02-06: qty 1

## 2020-02-06 MED ORDER — ACETAMINOPHEN 325 MG PO TABS: 650.0000 mg | ORAL_TABLET | Freq: Four times a day (QID) | ORAL | Status: DC | PRN

## 2020-02-06 MED ORDER — ONDANSETRON HCL 4 MG/2ML IJ SOLN: 4.0000 mg | Freq: Four times a day (QID) | INTRAMUSCULAR | Status: DC | PRN

## 2020-02-06 MED ORDER — ACETAMINOPHEN 650 MG RE SUPP: 650.0000 mg | Freq: Four times a day (QID) | RECTAL | Status: DC | PRN

## 2020-02-06 MED ORDER — INSULIN ASPART 100 UNIT/ML ~~LOC~~ SOLN
4.0000 [IU] | Freq: Once | SUBCUTANEOUS | Status: AC
  Administered 2020-02-06: 21:00:00 4 [IU] via INTRAVENOUS
  Filled 2020-02-06: qty 1

## 2020-02-06 NOTE — ED Triage Notes (Signed)
Pt presents to ED via [facility] from home c/o generalized weakness, diarrhea x2 years, abd pain, and hyperglycemia. CBG meter reads "high." Pt recently admitted for same. Arrives with 20G PIV from EMS with 541mL NS already infused. Pt states he usually takes insulin but didn't take any today. Abdomen appears mildly distended.

## 2020-02-06 NOTE — ED Notes (Signed)
Pt's abdomen appears more distended than on arrival. Bladder scan done, results >971mL. Pt attempted to urinate but states he can't. EDP notified, foley placed per order.

## 2020-02-06 NOTE — ED Provider Notes (Signed)
Patient received in signout from Dr. Kerman Passey.  Patient received fluids on glucose repeated down to 240 after receiving IV insulin as well.  Patient has a mild AKI.  Was also complaining of lower abdominal pain.  Patient was unable to empty her bladder.  Lower abdomen markedly distended.  Bladder scan showing greater than 1 L.  He was unable to spontaneously void therefore given concern for possible neurogenic bladder in the setting of his significant diabetes and bladder distention injury Foley catheter was inserted.  Patient states that he is unable to manage his blood sugar at home feeling severely weak and does not have a support system.  Will discuss with hospitalist for admission.   Merlyn Lot, MD 02/06/20 2226

## 2020-02-06 NOTE — H&P (Signed)
History and Physical    Gregory Moody PPI:[id number] DOB: 1976-03-17 DOA: 02/06/2020  PCP: [place]   Patient coming from: Home I have personally briefly reviewed patient's old medical records in [place]  Chief Complaint: Weakness, persistent nausea, lower abdominal pain  HPI: Gregory Moody is a 44 y.o. 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 difficile colitis, undergoing colonoscopy with negative biopsies, with hospitalization being complicated by acute urinary retention requiring temporary Foley placement, who presents to the emergency room with persistent weakness continued nausea with decreased oral intake and lower abdominal discomfort.  He states he simply has no appetite.  He denies fever or chills.  Has only diarrhea that has been chronic for the past 2 years.  He denies worsening.  Has protracted nausea but without vomiting.  Denies chest pain or shortness of breath.  Says his abdominal pain is  a fullness in the lower abdomen, nonradiating with no aggravating or alleviating factors.  It was relieved with Foley catheter placement in the ER  ED Course: On arrival in the emergency room he was hypotensive at 84/64 with otherwise normal vitals.  His blood sugar was 661 with anion gap of 10.  Creatinine 1.5 up from his baseline of 0.96.  Hemoglobin 7.7 which is about the same as on his discharge on 01/21/2020.  Last hemoglobin A1c on file was 15.  Patient had a bladder scan in the ER that showed 1 L of retained urine and a Foley catheter was placed.  He was treated in the ER with IV insulin with improvement in blood sugar from 661 down to the mid 200s, however patient due to  overall weakness felt unable to care for himself at home and hospitalization requested.  Review of Systems: As per HPI otherwise 10 point review of systems negative.    Past Medical History:  Diagnosis Date  . COVID-19   . Diabetes mellitus without complication ([place])   . Gastroparesis   . Tuberculosis     Past Surgical History:  Procedure Laterality Date  . COLONOSCOPY N/A 01/25/2020   Procedure: COLONOSCOPY;  Surgeon: Toledo, Benay Pike, MD;  Location: [facility] ENDOSCOPY;  Service: Gastroenterology;  Laterality: N/A;  . ESOPHAGOGASTRODUODENOSCOPY N/A 02/03/2019   Procedure: ESOPHAGOGASTRODUODENOSCOPY (EGD);  Surgeon: Toledo, Benay Pike, MD;  Location: [facility] ENDOSCOPY;  Service: Gastroenterology;  Laterality: N/A;     reports that he has been smoking cigars. He has never used smokeless tobacco. He reports that he does not drink alcohol or use drugs.  No Known Allergies  Family History  Problem Relation Age of Onset  . Diabetes Mother   . Diabetes Father      Prior to Admission medications   Medication Sig Start Date End Date Taking? Authorizing Provider  aspirin EC 81 MG tablet Take 81 mg by mouth daily.    [provider]  colesevelam [facility]) 625 MG tablet Take 3 tablets (1,875 mg total) by mouth 2 (two) times daily with a meal. 01/30/20   Charlynne Cousins, MD  fluconazole (DIFLUCAN) 200 MG tablet Take 1 tablet (200 mg total) by mouth daily. 01/03/20   Fritzi Mandes, MD  FLUoxetine (PROZAC) 10 MG capsule Take 10 mg by mouth daily.    [provider]  insulin aspart (NOVOLOG) 100 UNIT/ML  injection Inject 6 Units into the skin 3 (three) times daily with meals. 01/30/20   Charlynne Cousins, MD  insulin glargine (LANTUS) 100 UNIT/ML injection Inject 0.2 mLs (20 Units total) into the skin at bedtime. 01/30/20   Charlynne Cousins, MD  loperamide (IMODIUM) 2 MG capsule Take 2 mg by mouth as needed for diarrhea or loose stools.    [provider]  Multiple  Vitamin (MULTIVITAMIN WITH MINERALS) TABS tablet Take 1 tablet by mouth daily. 01/03/20   Fritzi Mandes, MD  sucralfate (CARAFATE) 1 g tablet Take 1 tablet (1 g total) by mouth 4 (four) times daily -  with meals and at bedtime. 01/03/20   Fritzi Mandes, MD    Physical Exam: Vitals:   02/06/20 2115 02/06/20 2130 02/06/20 2200 02/06/20 2230  BP:  94/64 96/67 111/79  Pulse: 64 66 69 66  Resp:  18 17 10   Temp:      TempSrc:      SpO2: 100% 100% 100% 100%  Weight:      Height:         Vitals:   02/06/20 2115 02/06/20 2130 02/06/20 2200 02/06/20 2230  BP:  94/64 96/67 111/79  Pulse: 64 66 69 66  Resp:  18 17 10   Temp:      TempSrc:      SpO2: 100% 100% 100% 100%  Weight:      Height:        Constitutional: Alert and awake, oriented x3, not in any acute distress.  Appears cachectic. Eyes: PERLA, EOMI, irises appear normal, anicteric sclera,  ENMT: external ears and nose appear normal, normal hearing             Lips appears normal, oropharynx mucosa, tongue, posterior pharynx appear normal  Neck: neck appears normal, no masses, normal ROM, no thyromegaly, no JVD  CVS: S1-S2 clear, no murmur rubs or gallops,  , no carotid bruits, pedal pulses palpable, No LE edema Respiratory:  clear to auscultation bilaterally, no wheezing, rales or rhonchi. Respiratory effort normal. No accessory muscle use.  Abdomen: soft nontender, nondistended, normal bowel sounds, no hepatosplenomegaly, no hernias Musculoskeletal: : no cyanosis, clubbing , no contractures or atrophy Neuro: Cranial nerves II-XII intact, sensation, reflexes normal, strength Psych: judgement and insight appear normal, stable mood and affect,  Skin: no rashes or lesions or ulcers, no induration or nodules   Labs on Admission: I have personally reviewed following labs and imaging studies  CBC: Recent Labs  Lab 02/06/20 1711  WBC 9.3  HGB 7.7*  HCT 24.7*  MCV 99.6  PLT 300   Basic Metabolic Panel: Recent Labs  Lab  02/06/20 1711  NA 125*  K 5.1  CL 97*  CO2 18*  GLUCOSE 661*  BUN 33*  CREATININE 1.50*  CALCIUM 8.3*   GFR: Estimated Creatinine Clearance: 52.4 mL/min (A) (by C-G formula based on SCr of 1.5 mg/dL (H)). Liver Function Tests: Recent Labs  Lab 02/06/20 1711  AST 23  ALT 62*  ALKPHOS 117  BILITOT 0.8  PROT 7.6  ALBUMIN 3.3*   Recent Labs  Lab 02/06/20 1711  LIPASE 42   No results for input(s): AMMONIA in the last 168 hours. Coagulation Profile: No results for input(s): INR, PROTIME in the last 168 hours. Cardiac Enzymes: No results for input(s): CKTOTAL, CKMB, CKMBINDEX, TROPONINI in the last 168 hours. BNP (last 3 results) No results for input(s): PROBNP in the last 8760 hours. HbA1C: No results for input(s): HGBA1C in  the last 72 hours. CBG: Recent Labs  Lab 02/06/20 1707 02/06/20 2007 02/06/20 2140  GLUCAP >600* 349* 249*   Lipid Profile: No results for input(s): CHOL, HDL, LDLCALC, TRIG, CHOLHDL, LDLDIRECT in the last 72 hours. Thyroid Function Tests: No results for input(s): TSH, T4TOTAL, FREET4, T3FREE, THYROIDAB in the last 72 hours. Anemia Panel: No results for input(s): VITAMINB12, FOLATE, FERRITIN, TIBC, IRON, RETICCTPCT in the last 72 hours. Urine analysis:    Component Value Date/Time   COLORURINE STRAW (A) 02/06/2020 1711   APPEARANCEUR CLEAR (A) 02/06/2020 1711   LABSPEC 1.020 02/06/2020 1711   PHURINE 5.0 02/06/2020 1711   GLUCOSEU >=500 (A) 02/06/2020 1711   HGBUR NEGATIVE 02/06/2020 1711   BILIRUBINUR NEGATIVE 02/06/2020 1711   KETONESUR NEGATIVE 02/06/2020 1711   PROTEINUR NEGATIVE 02/06/2020 1711   NITRITE NEGATIVE 02/06/2020 1711   LEUKOCYTESUR NEGATIVE 02/06/2020 1711    Radiological Exams on Admission: No results found.  EKG: Independently reviewed.   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 difficile, recently treated with oral vancomycin, will repeat C. difficile -Patient had colonoscopy during his recent hospitalization a couple weeks prior had several biopsies, pathology showed normal mucosa    History of COVID-29 April 2019 -No acute concerns    DVT prophylaxis: Lovenox  Code Status: full code  Family Communication:  none  Disposition Plan: Back to previous home environment Consults called: none  Status:obs    Athena Masse MD [facility]     02/06/2020, 10:51 PM

## 2020-02-06 NOTE — ED Provider Notes (Signed)
[facility] Emergency Department Provider Note  Time seen: 5:10 PM  I have reviewed the triage vital signs and the nursing notes.   HISTORY  Chief Complaint Hyperglycemia   HPI Gregory Moody is a 44 y.o. male with a past medical history of diabetes, presents to the emergency department for hyperglycemia.  According to the patient he has been feeling weak, his blood sugars been running high at home per patient.   States some nausea but denies vomiting.  Does state intermittent diarrhea.  No chest pain or shortness of breath.  Patient has not been checking his blood sugar frequently per patient.  States he has been using his medications including insulin.  Past Medical History:  Diagnosis Date  . COVID-19   . Diabetes mellitus without complication ([place])   . Gastroparesis   . Tuberculosis     Patient Active Problem List   Diagnosis Date Noted  . Diarrhea 01/21/2020  . Tobacco abuse 01/21/2020  . C. difficile colitis 01/21/2020  . Urinary retention 01/21/2020  . Diabetes mellitus without complication ([place]) 95/06/3266  . Protein-calorie malnutrition, severe 01/02/2020  . Odynophagia   . DKA (diabetic ketoacidoses) ([employer]) 12/27/2019  . Depression 12/27/2019  . DKA, type 2 ([place]) 12/27/2019  . Anemia of chronic disease 04/25/2019  . Hypomagnesemia 04/25/2019  . Hypotension 04/23/2019  . CAP (community acquired pneumonia) due to MSSA (methicillin sensitive Staphylococcus aureus) ([place]) 04/14/2019  . COVID-19 virus infection 04/14/2019  . Pressure injury of skin 03/31/2019  . Acute renal failure (ARF) ([place]) 03/06/2019  . CAP (community acquired pneumonia) 02/14/2019  . AKI (acute kidney injury) ([place]) 02/02/2019  . ARF (acute renal failure) ([place]) 01/03/2019  . Malnutrition of moderate degree 02/24/2018  . GERD (gastroesophageal reflux disease) 02/23/2018  . Diabetic gastroparesis ([place]) 02/23/2018  . Diabetic foot ulcer ([place]) 02/23/2018  . Aspiration  pneumonia ([place]) 04/21/2017  . Hypoglycemia 04/21/2017  . Diabetes mellitus with hyperglycemia ([place]) 04/21/2017  . Hip fracture, unspecified laterality, closed, initial encounter ([place]) 04/17/2017  . Hyperglycemia 04/17/2017  . Malnutrition ([place]) 04/09/2016  . Cavitary lesion of lung 04/06/2016    Past Surgical History:  Procedure Laterality Date  . COLONOSCOPY N/A 01/25/2020   Procedure: COLONOSCOPY;  Surgeon: Toledo, Benay Pike, MD;  Location: [facility] ENDOSCOPY;  Service: Gastroenterology;  Laterality: N/A;  . ESOPHAGOGASTRODUODENOSCOPY N/A 02/03/2019   Procedure: ESOPHAGOGASTRODUODENOSCOPY (EGD);  Surgeon: Toledo, Benay Pike, MD;  Location: [facility] ENDOSCOPY;  Service: Gastroenterology;  Laterality: N/A;    Prior to Admission medications   Medication Sig Start Date End Date Taking? Authorizing Provider  aspirin EC 81 MG tablet Take 81 mg by mouth daily.    [provider]  colesevelam [facility]) 625 MG tablet Take 3 tablets (1,875 mg total) by mouth 2 (two) times daily with a meal. 01/30/20   Charlynne Cousins, MD  fluconazole (DIFLUCAN) 200 MG tablet Take 1 tablet (200 mg total) by mouth daily. 01/03/20   Fritzi Mandes, MD  FLUoxetine (PROZAC) 10 MG capsule Take 10 mg by mouth daily.    [provider]  insulin aspart (NOVOLOG) 100 UNIT/ML injection Inject 6 Units into the skin 3 (three) times daily with meals. 01/30/20   Charlynne Cousins, MD  insulin glargine (LANTUS) 100 UNIT/ML injection Inject 0.2 mLs (20 Units total) into the skin at bedtime. 01/30/20   Charlynne Cousins, MD  loperamide (IMODIUM) 2 MG capsule Take 2 mg by mouth as needed for diarrhea or loose stools.    [provider]  Multiple Vitamin (MULTIVITAMIN WITH MINERALS) TABS tablet Take 1 tablet by mouth daily. 01/03/20   Fritzi Mandes, MD  sucralfate (CARAFATE) 1 g tablet Take 1 tablet (1 g total) by mouth 4 (four) times daily -  with meals and at bedtime. 01/03/20   Fritzi Mandes, MD    No Known  Allergies  Family History  Problem Relation Age of [place]  . Diabetes Mother   . Diabetes Father     Social History Social History   Tobacco Use  . Smoking status: Current Some Day Smoker    Types: Cigars  . Smokeless tobacco: Never Used  Substance Use Topics  . Alcohol use: No  . Drug use: No    Review of Systems Constitutional: Negative for fever. Cardiovascular: Negative for chest pain. Respiratory: Negative for shortness of breath. Gastrointestinal: Negative for abdominal pain.  Positive for nausea.  Intermittent diarrhea. Genitourinary: Negative for urinary compaints Musculoskeletal: Negative for musculoskeletal complaints Neurological: Negative for headache All other ROS negative  ____________________________________________   PHYSICAL EXAM:  VITAL SIGNS: ED Triage Vitals  Enc Vitals Group     BP      Pulse      Resp      Temp      Temp src      SpO2      Weight      Height      Head Circumference      Peak Flow      Pain Score      Pain Loc      Pain Edu?      Excl. in [place]?     Constitutional: Alert, no distress.  Disheveled appearing. Eyes: Normal exam ENT      Head: Normocephalic and atraumatic.      Mouth/Throat: Mucous membranes are moist. Cardiovascular: Normal rate, regular rhythm  Respiratory: Normal respiratory effort without tachypnea nor retractions. Breath sounds are clear Gastrointestinal: Soft and nontender. No distention.  Musculoskeletal: Nontender with normal range of motion in all extremities.  Neurologic:  Normal speech and language. No gross focal neurologic deficits  Skin:  Skin is warm.  Patient has several abrasions and scrapes over his extremities. Psychiatric: Mood and affect are normal.   ____________________________________________    EKG  EKG viewed and interpreted by myself shows a normal sinus rhythm at 75 bpm with a narrow QRS, normal axis, normal intervals, no concerning ST  changes.  ____________________________________________   INITIAL IMPRESSION / ASSESSMENT AND PLAN / ED COURSE  Pertinent labs & imaging results that were available during my care of the patient were reviewed by me and considered in my medical decision making (see chart for details).   Patient presents to the emergency department for hyperglycemia and generalized weakness.  We will check labs including urinalysis and a VBG.  We will IV hydrate.  Point-of-care CBG in the emergency department is greater than 600.  Differential this time would include HHS, DKA, metabolic or electrolyte abnormality, dehydration, infectious etiology.  Patient's blood glucose is improving with fluids and insulin.  We will dose an additional liter of fluids.  Blood glucose down to 350, ideally we would like this less than 300 before going home.  Patient agreeable to plan of care.  Continues to appear well in the emergency department.  Gregory Moody was evaluated in Emergency Department on 02/06/2020 for the symptoms described in the history of present illness. He was evaluated in the context of the global COVID-19 pandemic, which necessitated consideration that  the patient might be at risk for infection with the SARS-CoV-2 virus that causes COVID-19. Institutional protocols and algorithms that pertain to the evaluation of patients at risk for COVID-19 are in a state of rapid change based on information released by regulatory bodies including the CDC and federal and state organizations. These policies and algorithms were followed during the patient's care in the ED.  ____________________________________________   FINAL CLINICAL IMPRESSION(S) / ED DIAGNOSES  Hyperglycemia   Harvest Dark, MD 02/06/20 2036

## 2020-02-07 ENCOUNTER — Inpatient Hospital Stay: Payer: Self-pay

## 2020-02-07 DIAGNOSIS — R9389 Abnormal findings on diagnostic imaging of other specified body structures: Secondary | ICD-10-CM

## 2020-02-07 DIAGNOSIS — E43 Unspecified severe protein-calorie malnutrition: Secondary | ICD-10-CM

## 2020-02-07 DIAGNOSIS — R338 Other retention of urine: Secondary | ICD-10-CM

## 2020-02-07 DIAGNOSIS — Z794 Long term (current) use of insulin: Secondary | ICD-10-CM

## 2020-02-07 DIAGNOSIS — E1165 Type 2 diabetes mellitus with hyperglycemia: Principal | ICD-10-CM

## 2020-02-07 DIAGNOSIS — K529 Noninfective gastroenteritis and colitis, unspecified: Secondary | ICD-10-CM

## 2020-02-07 DIAGNOSIS — N179 Acute kidney failure, unspecified: Secondary | ICD-10-CM

## 2020-02-07 DIAGNOSIS — R531 Weakness: Secondary | ICD-10-CM

## 2020-02-07 LAB — RESPIRATORY PANEL BY RT PCR (FLU A&B, COVID)
Influenza A by PCR: NEGATIVE
Influenza B by PCR: NEGATIVE
SARS Coronavirus 2 by RT PCR: NEGATIVE

## 2020-02-07 LAB — CBC
HCT: 24.1 % — ABNORMAL LOW (ref 39.0–52.0)
Hemoglobin: 8.1 g/dL — ABNORMAL LOW (ref 13.0–17.0)
MCH: 31.6 pg (ref 26.0–34.0)
MCHC: 33.6 g/dL (ref 30.0–36.0)
MCV: 94.1 fL (ref 80.0–100.0)
Platelets: 337 10*3/uL (ref 150–400)
RBC: 2.56 MIL/uL — ABNORMAL LOW (ref 4.22–5.81)
RDW: 14.2 % (ref 11.5–15.5)
WBC: 8.3 10*3/uL (ref 4.0–10.5)
nRBC: 0 % (ref 0.0–0.2)

## 2020-02-07 LAB — BASIC METABOLIC PANEL
Anion gap: 4 — ABNORMAL LOW (ref 5–15)
BUN: 24 mg/dL — ABNORMAL HIGH (ref 6–20)
CO2: 21 mmol/L — ABNORMAL LOW (ref 22–32)
Calcium: 8.3 mg/dL — ABNORMAL LOW (ref 8.9–10.3)
Chloride: 110 mmol/L (ref 98–111)
Creatinine, Ser: 1.1 mg/dL (ref 0.61–1.24)
GFR calc Af Amer: >60 mL/min (ref >60)
GFR calc non Af Amer: >60 mL/min (ref >60)
Glucose, Bld: 297 mg/dL — ABNORMAL HIGH (ref 70–99)
Potassium: 4.8 mmol/L (ref 3.5–5.1)
Sodium: 135 mmol/L (ref 135–145)

## 2020-02-07 LAB — GLUCOSE, CAPILLARY
Glucose-Capillary: 294 mg/dL — ABNORMAL HIGH (ref 70–99)
Glucose-Capillary: 300 mg/dL — ABNORMAL HIGH (ref 70–99)
Glucose-Capillary: 298 mg/dL — ABNORMAL HIGH (ref 70–99)
Glucose-Capillary: 177 mg/dL — ABNORMAL HIGH (ref 70–99)

## 2020-02-07 LAB — RETIC PANEL
Immature Retic Fract: 6.4 % (ref 2.3–15.9)
RBC.: 2.52 MIL/uL — ABNORMAL LOW (ref 4.22–5.81)
Retic Count, Absolute: 59.2 10*3/uL (ref 19.0–186.0)
Retic Ct Pct: 2.4 % (ref 0.4–3.1)
Reticulocyte Hemoglobin: 33.8 pg (ref >27.9)

## 2020-02-07 LAB — C DIFFICILE QUICK SCREEN W PCR REFLEX
C Diff antigen: NEGATIVE
C Diff interpretation: NOT DETECTED
C Diff toxin: NEGATIVE

## 2020-02-07 LAB — VITAMIN B12: Vitamin B-12: 452 pg/mL (ref 180–914)

## 2020-02-07 LAB — IRON AND TIBC
Iron: 61 ug/dL (ref 45–182)
Saturation Ratios: 22 % (ref 17.9–39.5)
TIBC: 281 ug/dL (ref 250–450)
UIBC: 220 ug/dL

## 2020-02-07 LAB — OCCULT BLOOD X 1 CARD TO LAB, STOOL: Fecal Occult Bld: POSITIVE — AB

## 2020-02-07 LAB — HEMOGLOBIN A1C
Hgb A1c MFr Bld: 10.8 % — ABNORMAL HIGH (ref 4.8–5.6)
Mean Plasma Glucose: 263.26 mg/dL

## 2020-02-07 LAB — FERRITIN: Ferritin: 162 ng/mL (ref 24–336)

## 2020-02-07 IMAGING — CR DG CHEST 2V
1 series · 2 of 2 positions shown · non-contrast
Comparison: [DATE] [DATE], [DATE]. [DATE] [DATE], [DATE].

CLINICAL DATA: Weight loss.

EXAM:
CHEST - 2 VIEW

[Series 1: dg chest 2 view · 0.14mm/px · 2 of 2 slices shown]
[im 1/2]
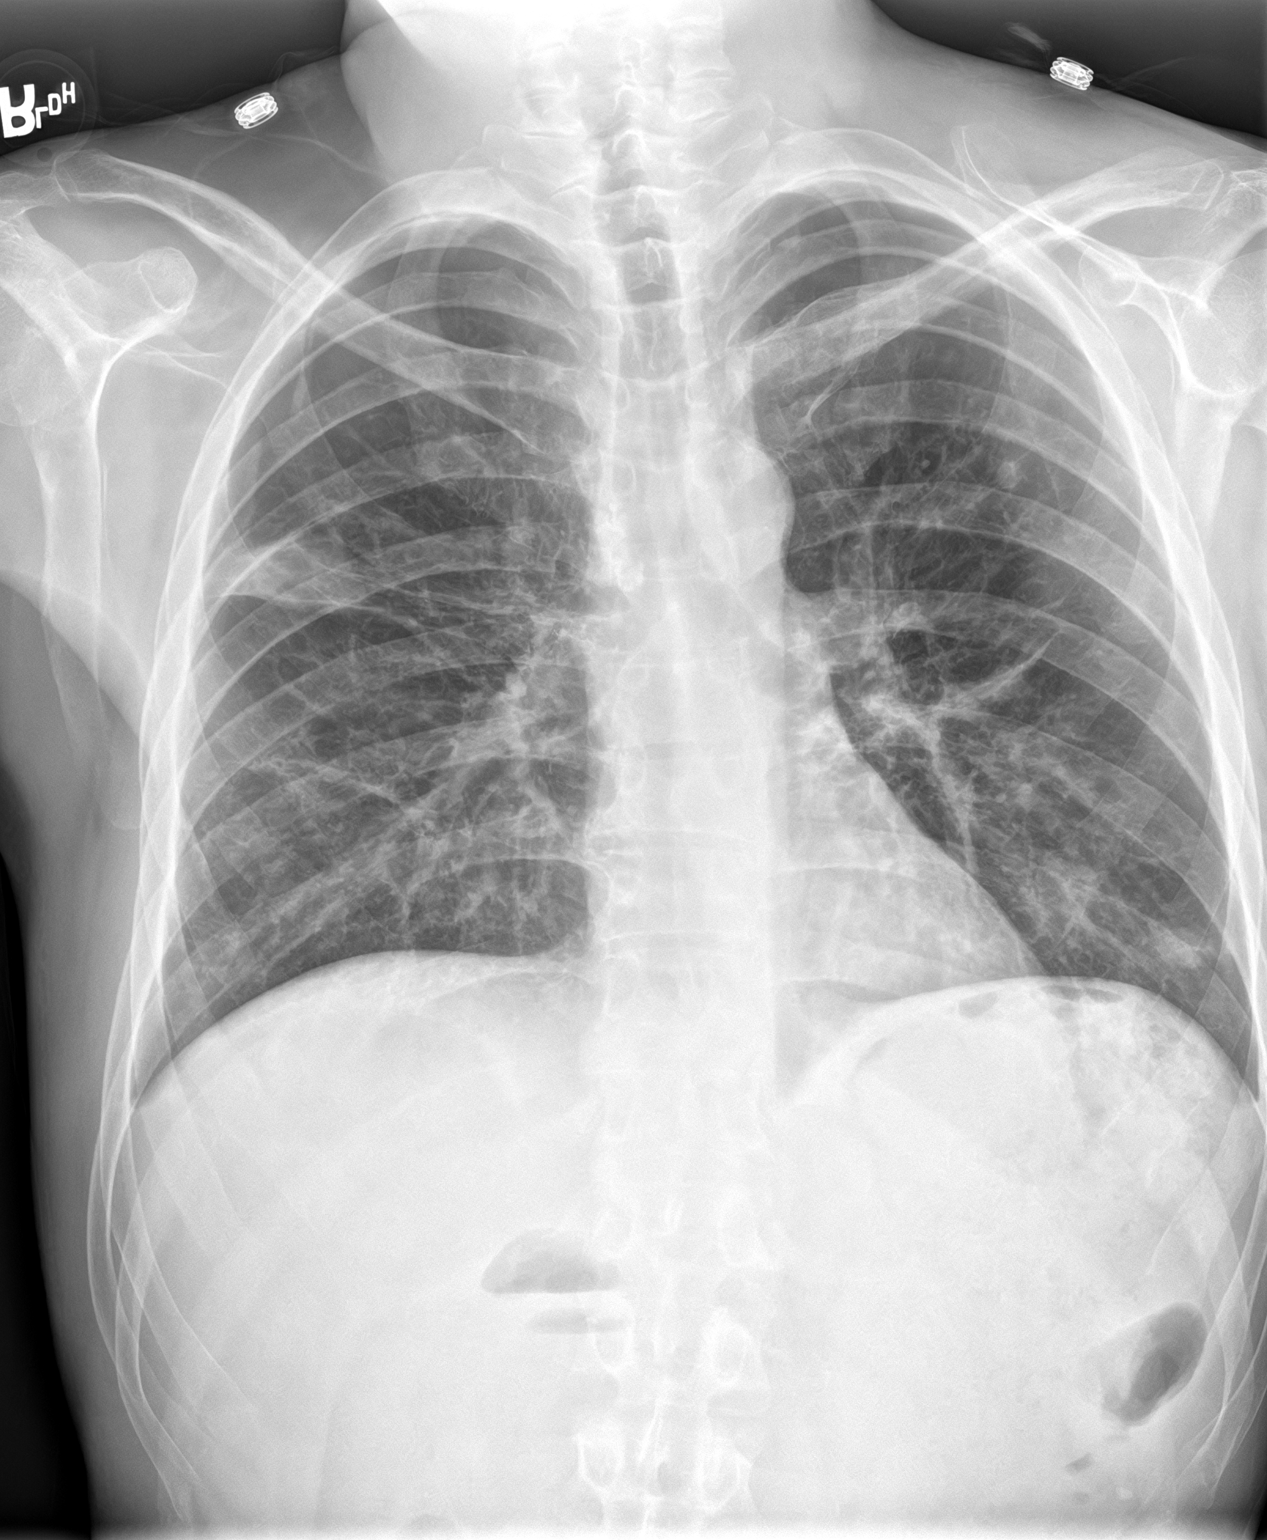
[im 2/2]
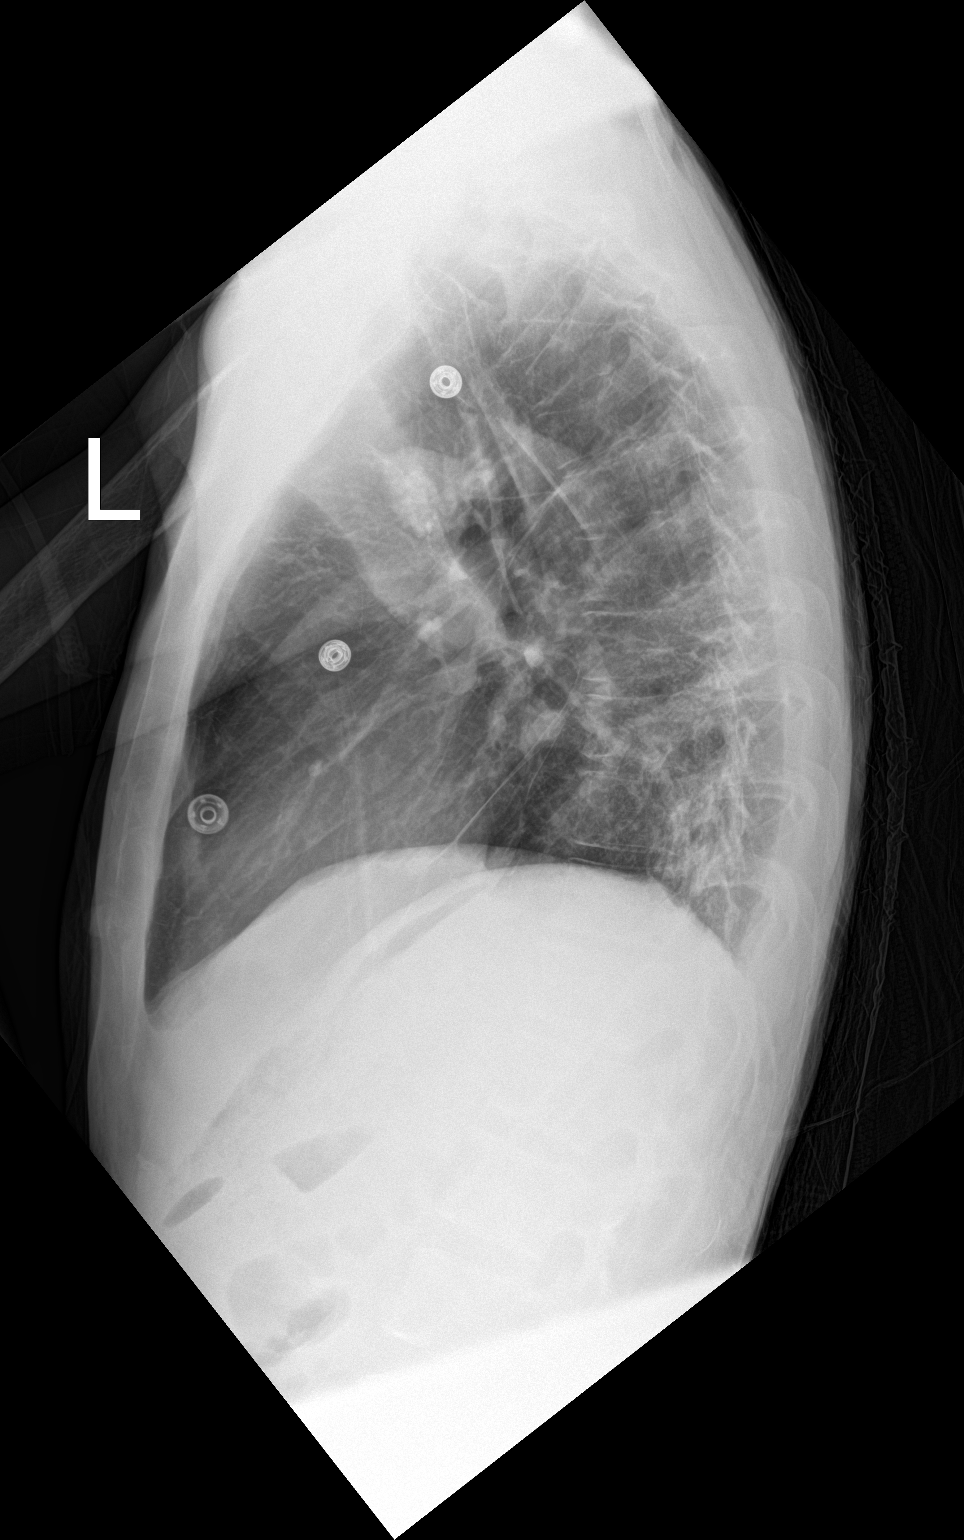

[2 of 2 positions shown; findings below may reference images not displayed]

FINDINGS: The heart size and mediastinal contours are within normal limits.
Stable nodule seen laterally in right upper lobe. Interval
development of nodular opacity seen in the left upper and lower
lobes which may be inflammatory in etiology. The visualized skeletal
structures are unremarkable.
IMPRESSION: Interval development of nodular opacities seen in left upper and
lower lobes which may be inflammatory in etiology. Follow-up
radiographs are recommended to ensure resolution.

## 2020-02-07 IMAGING — US US ABDOMEN LIMITED
1 series · 14 of 25 positions shown · non-contrast
Comparison: CT [DATE]

CLINICAL DATA: Chronic diarrhea

EXAM:
ULTRASOUND ABDOMEN LIMITED RIGHT UPPER QUADRANT

[Series 1: us abdomen limited ruq · 14 of 40 slices shown]
[im 1/40]
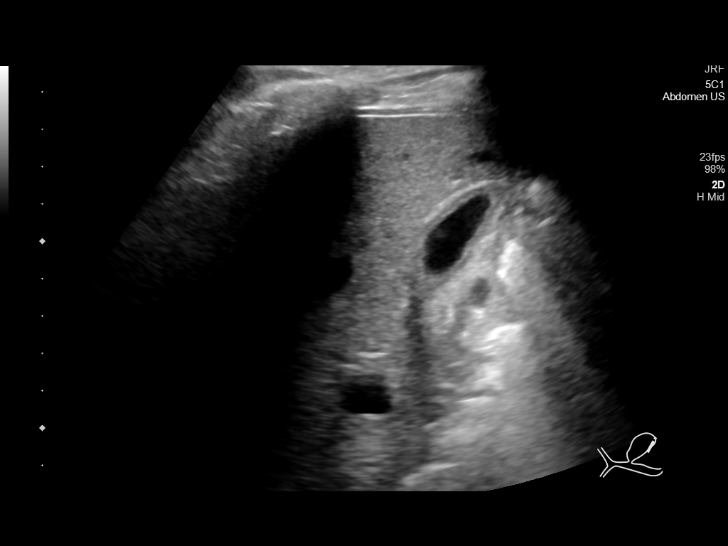
[im 4/40]
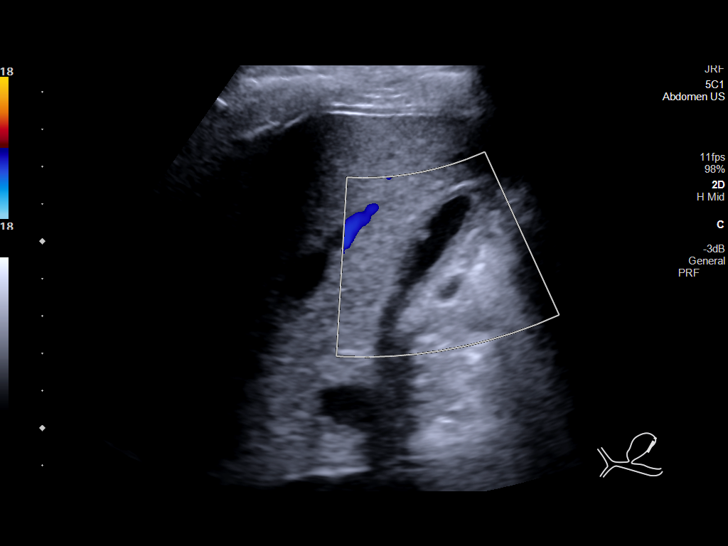
[im 7/40]
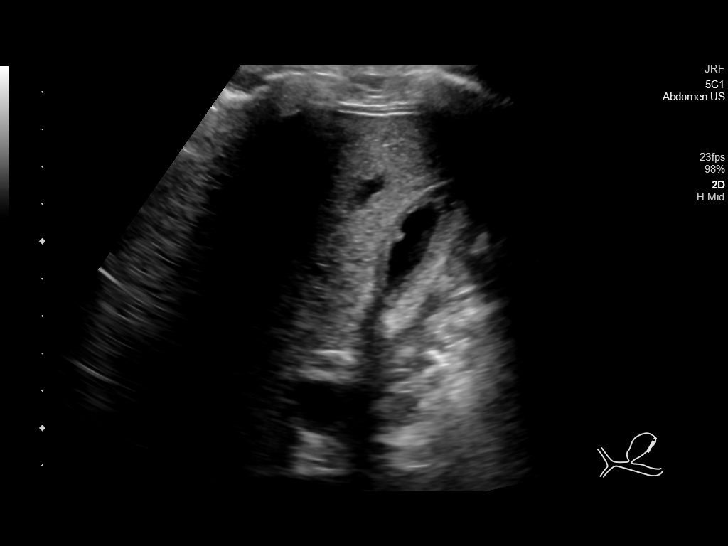
[im 10/40]
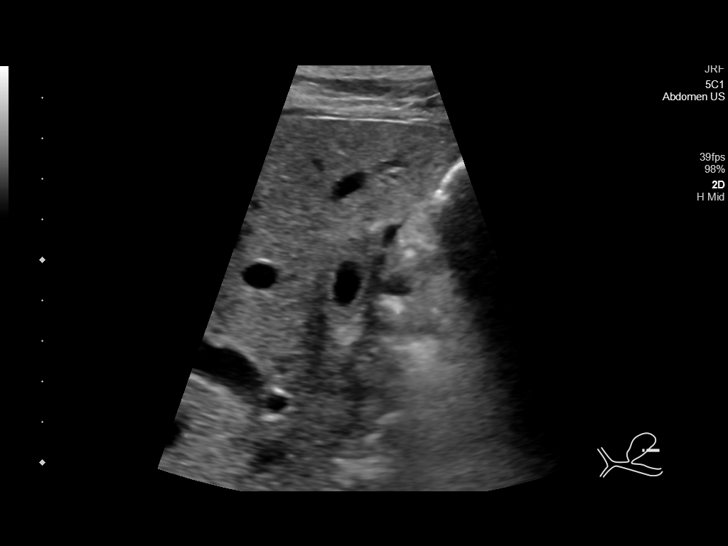
[im 14/40]
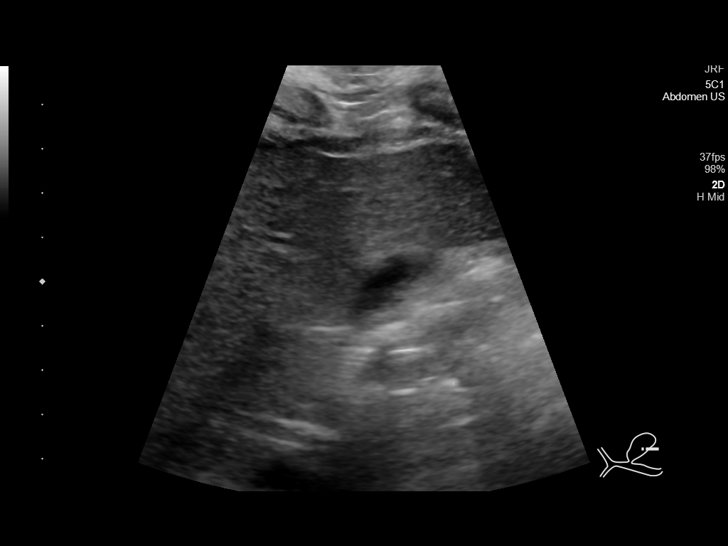
[im 15/40]
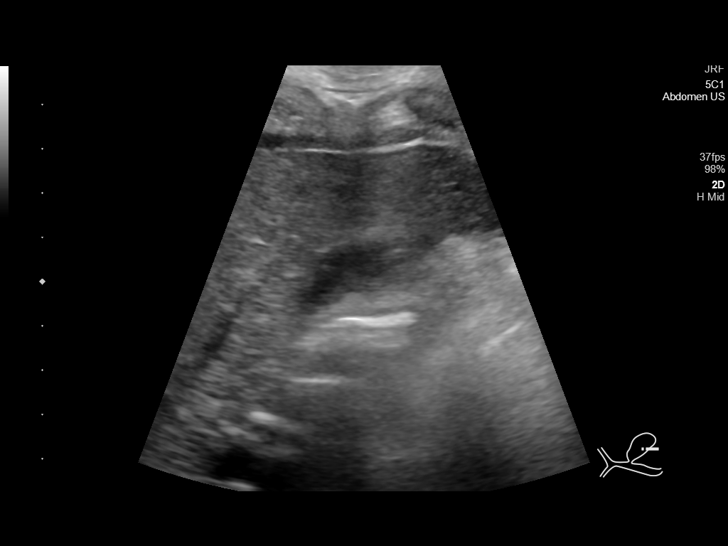
[im 18/40]
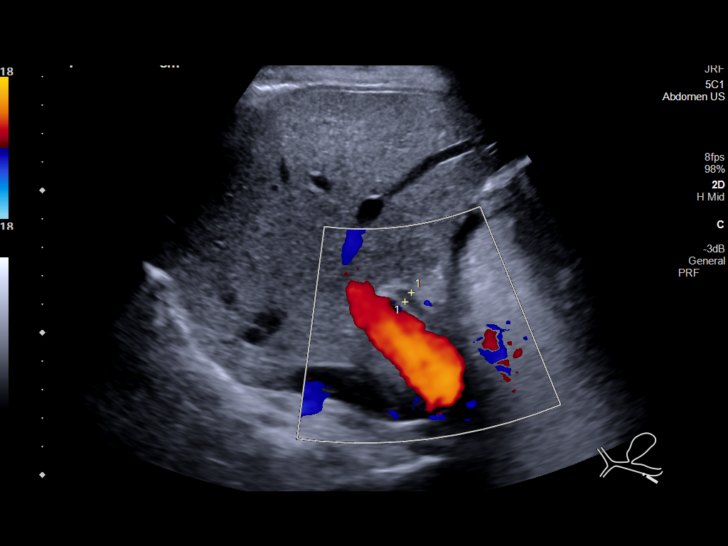
[im 22/40]
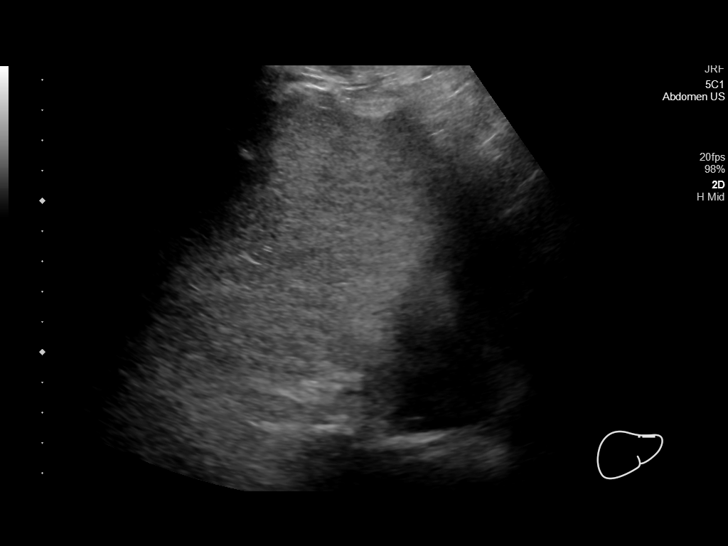
[im 25/40]
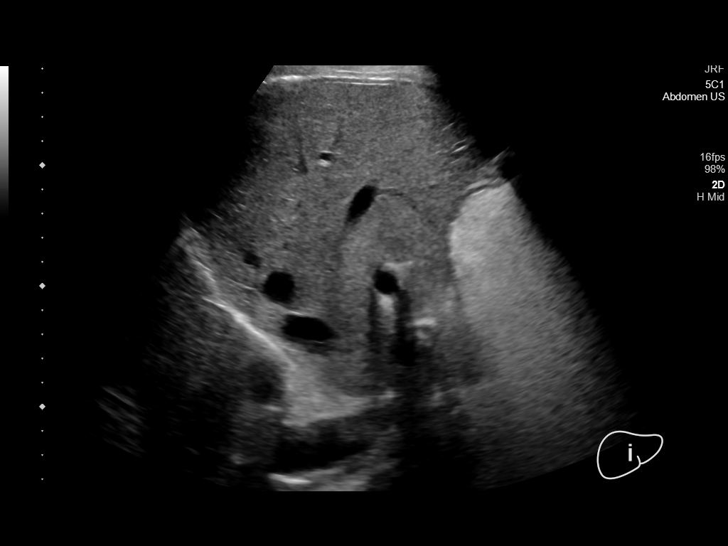
[im 27/40]
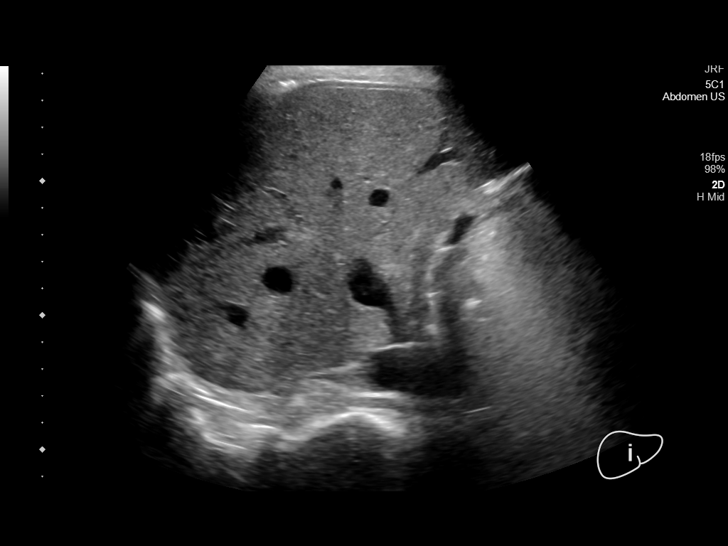
[im 30/40]
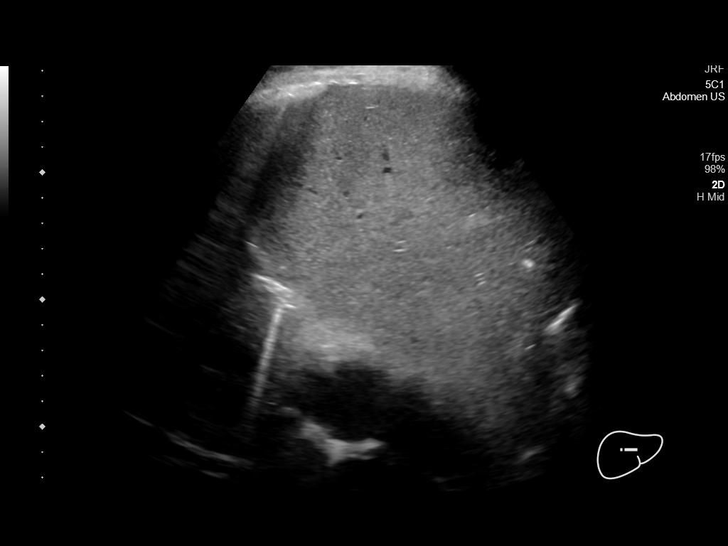
[im 33/40]
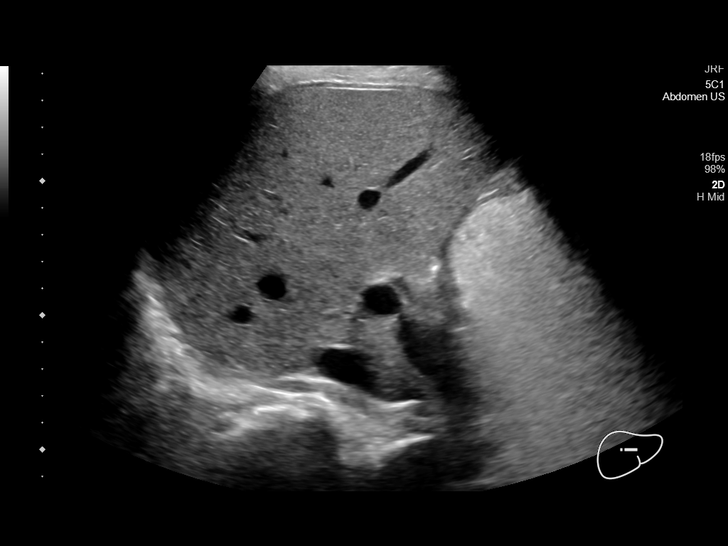
[im 36/40]
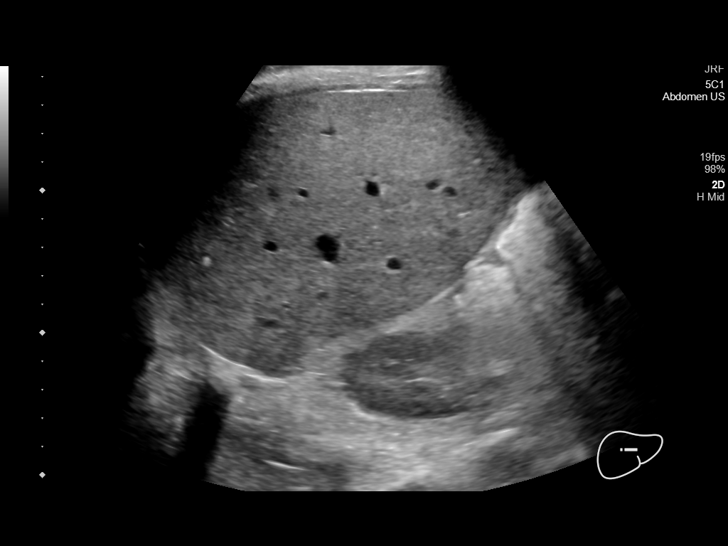
[im 40/40]
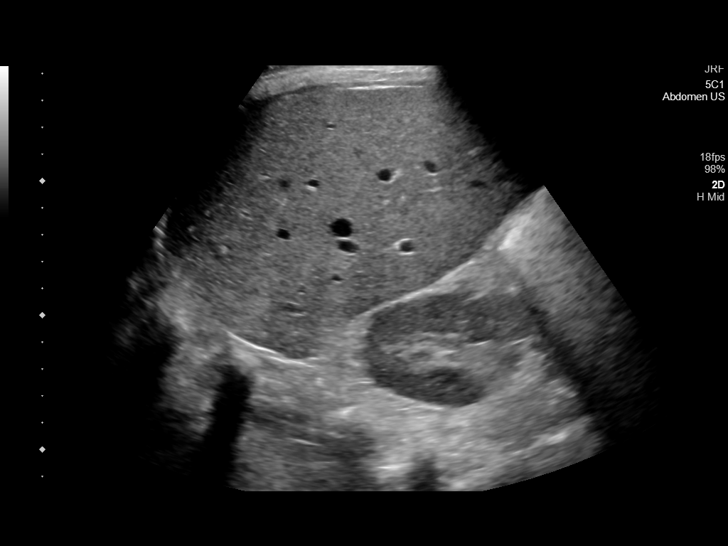

[14 of 25 positions shown; findings below may reference images not displayed]

FINDINGS: Gallbladder:

Small 4 mm polyp. Gallbladder slightly contracted. Borderline wall
thickening. No sonographic Murphy indicated.

Common bile duct:

Diameter: 4 mm

Liver:

No focal lesion identified. Within normal limits in parenchymal
echogenicity. Portal vein is patent on color Doppler imaging with
normal direction of blood flow towards the liver.

Other: None.
IMPRESSION: 1. Contracted appearing gallbladder with small polyp. No definitive
sonographic features to suggest acute gallbladder disease.
2. Otherwise negative right upper quadrant abdominal ultrasound

## 2020-02-07 MED ORDER — ENSURE MAX PROTEIN PO LIQD
11.0000 [oz_av] | Freq: Two times a day (BID) | ORAL | Status: DC
  Administered 2020-02-07 – 2020-02-09 (×5): 11 [oz_av] via ORAL
  Filled 2020-02-07: qty 330

## 2020-02-07 MED ORDER — DIPHENOXYLATE-ATROPINE 2.5-0.025 MG PO TABS: 1.0000 | ORAL_TABLET | Freq: Four times a day (QID) | ORAL | Status: DC | PRN

## 2020-02-07 MED ORDER — CHLORHEXIDINE GLUCONATE CLOTH 2 % EX PADS: 6.0000 | MEDICATED_PAD | Freq: Every day | CUTANEOUS | Status: DC

## 2020-02-07 MED ORDER — ADULT MULTIVITAMIN W/MINERALS CH
1.0000 | ORAL_TABLET | Freq: Every day | ORAL | Status: DC
  Administered 2020-02-08 – 2020-02-09 (×2): 1 via ORAL
  Filled 2020-02-07 (×2): qty 1

## 2020-02-07 MED ORDER — TAMSULOSIN HCL 0.4 MG PO CAPS
0.4000 mg | ORAL_CAPSULE | Freq: Every day | ORAL | Status: DC
  Administered 2020-02-07 – 2020-02-09 (×3): 0.4 mg via ORAL
  Filled 2020-02-07 (×3): qty 1

## 2020-02-07 MED ORDER — ZINC OXIDE 40 % EX OINT
TOPICAL_OINTMENT | Freq: Two times a day (BID) | CUTANEOUS | Status: DC | PRN
  Filled 2020-02-07: qty 113

## 2020-02-07 MED ORDER — INSULIN GLARGINE 100 UNIT/ML ~~LOC~~ SOLN
18.0000 [IU] | Freq: Every day | SUBCUTANEOUS | Status: DC
  Administered 2020-02-08: 18 [IU] via SUBCUTANEOUS
  Filled 2020-02-07 (×2): qty 0.18

## 2020-02-07 MED ORDER — INSULIN ASPART 100 UNIT/ML ~~LOC~~ SOLN
3.0000 [IU] | Freq: Three times a day (TID) | SUBCUTANEOUS | Status: DC
  Administered 2020-02-07 – 2020-02-08 (×4): 3 [IU] via SUBCUTANEOUS
  Filled 2020-02-07 (×4): qty 1

## 2020-02-07 MED ORDER — MENTHOL 3 MG MT LOZG
1.0000 | LOZENGE | OROMUCOSAL | Status: DC | PRN
  Filled 2020-02-07: qty 9

## 2020-02-07 NOTE — Progress Notes (Signed)
Patient c/o catheter being uncomfortable , Dr, Earleen Newport aware patient cussing at this writer states " I don't want this thing in a fucking week , It's hurting my Barbarann Ehlers !'.

## 2020-02-07 NOTE — ED Notes (Signed)
Ready bed @ 1105, patient going to room 157. Spoke with Geographical information systems officer.

## 2020-02-07 NOTE — TOC Progression Note (Signed)
Transition of Care [facility]) - Progression Note    Patient Details  Name: Alphus Zeck MRN: [id number] Date of Birth: [birth year]  Transition of Care [facility]) CM/SW Contact  Su Hilt, RN Phone Number: 02/07/2020, 2:29 PM  Clinical Narrative:     Patient is on contact precautions, RNCM attempted to reach the patient by phone  Unable to reach the patient , called back and was able to reach him, he was inpatient 2 weeks ago and at that time stated that he lives with his mom, dad, and son. Pt states he still goes to [facility]. The patient was set up with [facility] and they were scheduled to see him on the 26th.  I called [name] with [facility] to confirm, they did not see the patient, they were unable to reach the patient, [name] had spoken with the patient in the room prior to him discharging 2 weeks ago to let him know that they would be calling him.  We are unable to set him up with a SNF due to no insurance and he is unable to pay out of pocket. The patient is agreeable to continue with the plan to go home with Home health, He confirms that he lives with his mother, father and son.  He verified the phone number in the system was not correct and provided with the number [phone].  I updated the number in the system, The patient has a walker at home and stated that he does not need additional DME       Expected Discharge Plan and Services                                                 Social Determinants of Health (SDOH) Interventions    Readmission Risk Interventions Readmission Risk Prevention Plan 04/19/2019 03/07/2019  Transportation Screening Complete Complete  PCP or Specialist Appt within 3-5 Days - Complete  Social Work Consult for [place] Planning/Counseling - (No Data)  Palliative Care Screening - Not Applicable  Medication Review Press photographer) Complete Complete  PCP or Specialist appointment within 3-5 days of discharge  Complete -  [place] or [place] Complete -  SW Recovery Care/Counseling Consult Complete -  Palliative Care Screening Not Applicable -  [place] Not Applicable -  Some recent data might be hidden

## 2020-02-07 NOTE — Progress Notes (Signed)
Patients foley removed without incident,, sighed states " that feels so much better" .

## 2020-02-07 NOTE — ED Notes (Signed)
Report given to Lauren, RN.

## 2020-02-07 NOTE — Progress Notes (Signed)
Initial Nutrition Assessment  DOCUMENTATION CODES:   Severe malnutrition in context of chronic illness  INTERVENTION:   Ensure Max Protein po BID, each supplement provides 150 kcal and 30 grams of protein. Patient prefers vanilla.  MVI daily   NUTRITION DIAGNOSIS:   Severe Malnutrition related to chronic illness(uncontrolled DM) as evidenced by severe fat depletion, severe muscle depletion.  GOAL:   Patient will meet greater than or equal to 90% of their needs  MONITOR:   PO intake, Supplement acceptance, Labs, Weight trends, Skin, I & O's  REASON FOR ASSESSMENT:   Consult Assessment of nutrition requirement/status  ASSESSMENT:   44 y.o. 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 difficile colitis, undergoing colonoscopy with negative biopsies, with hospitalization being complicated by acute urinary retention requiring temporary Foley placement, who presents to the emergency room with persistent weakness continued nausea with decreased oral intake and lower abdominal discomfort.   Pt is well-known to nutrition department and this RD from previous admissions. Pt with chronic malnutrition. Patient reported during his last admit that his appetite had started to improve at home but overall was still decreased related to diarrhea. Pt does not like the Ensure Enlive as he reports it is too sweet and causes nausea. Pt drinking vanilla Ensure Max Protein during his last admit. He prefers to have his second ONS at bedtime. Pt was eating 100% of meals prior to his last discharge. RD will add supplements and MVI to help pt meet his estimated needs.   Per chart, pt appears fairly weight stable pta.   Medications  reviewed and include: lovenox, insulin, reglan, NaCl @50ml /hr  Labs reviewed: BUN 24(H) Hgb 8.1(L), Hct 24.1(L) cbgs- 294, 300 x 24 hrs AIC 10.8(H)- 4/29  Diet Order:   Diet Order            Diet Carb Modified Fluid consistency: Thin; Room service appropriate? Yes  Diet effective now             EDUCATION NEEDS:   Education needs have been addressed  Skin:  Skin Assessment: Reviewed RN Assessment(ecchymosis)  Last BM:  4/20- type 7  Height:   Ht Readings from Last 1 Encounters:  02/06/20 5\' 7"  (1.702 m)   Weight:   Wt Readings from Last 1 Encounters:  02/06/20 59 kg   Ideal Body Weight:  67 kg  BMI:  Body mass index is 20.36 kg/m.  Estimated Nutritional Needs:   Kcal:  1800-2000kcal/day  Protein:  90-100g/day  Fluid:  1.5-1.8L/day  Koleen Distance MS, RD, LDN Please refer to [facility] for RD and/or RD on-call/weekend/after hours pager

## 2020-02-07 NOTE — Progress Notes (Signed)
Patient ID: Gregory Moody, male   DOB: 06/05/1976, 44 y.o.   MRN: [id number]  Patient yelling at nurse wanting the Foley catheter to be removed.  Usually it stays in for a week.  He would rather do in and out catheterizations.  Discontinue Foley.  Continue Flomax.  In and out catheterizations every 6 hours as needed for urinary retention greater than 500 mL  Dr. Leslye Peer

## 2020-02-07 NOTE — ED Notes (Signed)
PT at bedside.

## 2020-02-07 NOTE — Progress Notes (Signed)
Patient ID: Gregory Moody, male   DOB: August 06, 1976, 44 y.o.   MRN: [id number] [facility] PROGRESS NOTE  Gregory Moody RFF:[id number] DOB: May 04, 1976 DOA: 02/06/2020 PCP: [place]  HPI/Subjective: Patient states that he has chronic diarrhea going on for the last 3 years.  Had a recent diagnosis of C. difficile colitis.  Also had recent colonoscopy.  Having some abdominal pain.  Required Foley catheter placement for urinary retention.  Feeling very weak.  Had a diarrhea episode with walking with physical therapy.  He has had some weight loss.  Objective: Vitals:   02/07/20 1100 02/07/20 1237  BP: 109/75 123/88  Pulse: 71 65  Resp:  18  Temp:  97.7 F (36.5 C)  SpO2: 100% 100%    Intake/Output Summary (Last 24 hours) at 02/07/2020 1348 Last data filed at 02/07/2020 0109 Gross per 24 hour  Intake --  Output 2750 ml  Net -2750 ml   Filed Weights   02/06/20 1710  Weight: 59 kg    ROS: Review of Systems  Constitutional: Positive for weight loss. Negative for fever.  Eyes: Negative for blurred vision.  Respiratory: Negative for cough and shortness of breath.   Cardiovascular: Negative for chest pain.  Gastrointestinal: Positive for abdominal pain, diarrhea and nausea. Negative for constipation and vomiting.  Genitourinary: Negative for dysuria.  Musculoskeletal: Negative for joint pain.  Neurological: Negative for dizziness.   Exam: Physical Exam  Constitutional: He is oriented to person, place, and time.  HENT:  Nose: No mucosal edema.  Mouth/Throat: No oropharyngeal exudate or posterior oropharyngeal edema.  Eyes: Conjunctivae and lids are normal.  Neck: Carotid bruit is not present.  Cardiovascular: S1 normal and S2 normal. Exam reveals no gallop.  No murmur heard. Respiratory: No respiratory distress. He has decreased breath sounds in the right lower field and the left lower field. He has no wheezes. He has no rhonchi. He has no  rales.  GI: Soft. Bowel sounds are normal. There is abdominal tenderness in the right upper quadrant.  Musculoskeletal:     Right ankle: No swelling.     Left ankle: No swelling.  Lymphadenopathy:    He has no cervical adenopathy.  Neurological: He is alert and oriented to person, place, and time. No cranial nerve deficit.  Skin: Skin is warm. No rash noted. Nails show no clubbing.  Psychiatric: He has a normal mood and affect.      Data Reviewed: Basic Metabolic Panel: Recent Labs  Lab 02/06/20 1711 02/07/20 0522  NA 125* 135  K 5.1 4.8  CL 97* 110  CO2 18* 21*  GLUCOSE 661* 297*  BUN 33* 24*  CREATININE 1.50* 1.10  CALCIUM 8.3* 8.3*   Liver Function Tests: Recent Labs  Lab 02/06/20 1711  AST 23  ALT 62*  ALKPHOS 117  BILITOT 0.8  PROT 7.6  ALBUMIN 3.3*   Recent Labs  Lab 02/06/20 1711  LIPASE 42   CBC: Recent Labs  Lab 02/06/20 1711 02/07/20 0522  WBC 9.3 8.3  HGB 7.7* 8.1*  HCT 24.7* 24.1*  MCV 99.6 94.1  PLT 372 337   BNP (last 3 results) Recent Labs    04/19/19 0300 04/20/19 0500 04/20/19 1108  BNP 450.9* SPECIMEN(S) LOST IN TRANSIT DUPLICATE  357.0*    CBG: Recent Labs  Lab 02/06/20 2007 02/06/20 2140 02/06/20 2318 02/07/20 0807 02/07/20 1245  GLUCAP 349* 249* 280* 294* 300*    Recent Results (from the past 240 hour(s))  Giardia, EIA; Ova/Parasite  Status: None   Collection Time: 01/29/20 12:08 AM   Specimen: Stool  Result Value Ref Range Status   Ova + Parasite Exam Final report  Final    Comment: (NOTE) These results were obtained using wet preparation(s) and trichrome stained smear. This test does not include testing for Cryptosporidium parvum, Cyclospora, or Microsporidia.    Giardia Ag, Stl Negative Negative Final    Comment: (NOTE) Performed At: [facility] [place], [place] [id number] Caprice Red MD JJ:[id number] Performed At: [facility] [place], [place]  [id number] Rush Farmer MD FY:[id number]   Respiratory Panel by RT PCR (Flu A&B, Covid) - Nasopharyngeal Swab     Status: None   Collection Time: 02/06/20 10:25 PM   Specimen: Nasopharyngeal Swab  Result Value Ref Range Status   SARS Coronavirus 2 by RT PCR NEGATIVE NEGATIVE Final    Comment: (NOTE) SARS-CoV-2 target nucleic acids are NOT DETECTED. The SARS-CoV-2 RNA is generally detectable in upper respiratoy specimens during the acute phase of infection. The lowest concentration of SARS-CoV-2 viral copies this assay can detect is 131 copies/mL. A negative result does not preclude SARS-Cov-2 infection and should not be used as the sole basis for treatment or other patient management decisions. A negative result may occur with  improper specimen collection/handling, submission of specimen other than nasopharyngeal swab, presence of viral mutation(s) within the areas targeted by this assay, and inadequate number of viral copies (<131 copies/mL). A negative result must be combined with clinical observations, patient history, and epidemiological information. The expected result is Negative. Fact Sheet for Patients:  PinkCheek.be Fact Sheet for Healthcare Providers:  GravelBags.it This test is not yet ap proved or cleared by the [place] FDA and  has been authorized for detection and/or diagnosis of SARS-CoV-2 by FDA under an Emergency Use Authorization (EUA). This EUA will remain  in effect (meaning this test can be used) for the duration of the COVID-19 declaration under Section 564(b)(1) of the Act, 21 U.S.C. section 360bbb-3(b)(1), unless the authorization is terminated or revoked sooner.    Influenza A by PCR NEGATIVE NEGATIVE Final   Influenza B by PCR NEGATIVE NEGATIVE Final    Comment: (NOTE) The Xpert Xpress SARS-CoV-2/FLU/RSV assay is intended as an aid in  the diagnosis of influenza from Nasopharyngeal swab  specimens and  should not be used as a sole basis for treatment. Nasal washings and  aspirates are unacceptable for Xpert Xpress SARS-CoV-2/FLU/RSV  testing. Fact Sheet for Patients: PinkCheek.be Fact Sheet for Healthcare Providers: GravelBags.it This test is not yet approved or cleared by the [place] FDA and  has been authorized for detection and/or diagnosis of SARS-CoV-2 by  FDA under an Emergency Use Authorization (EUA). This EUA will remain  in effect (meaning this test can be used) for the duration of the  Covid-19 declaration under Section 564(b)(1) of the Act, 21  U.S.C. section 360bbb-3(b)(1), unless the authorization is  terminated or revoked. Performed at [facility], [address] [place],  [address]      Scheduled Meds: . Chlorhexidine Gluconate Cloth  6 each Topical Daily  . enoxaparin (LOVENOX) injection  40 mg Subcutaneous Q24H  . insulin aspart  0-5 Units Subcutaneous QHS  . insulin aspart  0-9 Units Subcutaneous TID WC  . insulin aspart  3 Units Subcutaneous TID WC  . insulin glargine  18 Units Subcutaneous Daily  . metoCLOPramide (REGLAN) injection  5 mg Intravenous Q6H  . [START  ON 02/08/2020] multivitamin with minerals  1 tablet Oral Daily  . Ensure Max Protein  11 oz Oral BID  . tamsulosin  0.4 mg Oral QPC breakfast   Continuous Infusions: . sodium chloride 50 mL/hr at 02/07/20 0848    Assessment/Plan:  1. Uncontrolled type 2 diabetes mellitus with hyperglycemia.  I placed the patient on Lantus insulin and short acting insulin plus sliding scale.  Sugar was 661 on presentation 2. Generalized weakness.  Physical therapy recommends rehab 3. Acute urinary retention Foley catheter placed in the emergency room I started Flomax. 4. Acute kidney injury improved with IV fluids 5. 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  Send off C. difficile testing.  We will get an  right upper quadrant ultrasound. 6. Abnormal CT scan of the chest last month. Will get a cxr 7. Severe malnutrition 8. Anemia likely iron deficiency.  Code Status:     Code Status Orders  (From admission, onward)         Start     Ordered   02/06/20 2247  Full code  Continuous     02/06/20 2250        Code Status History    Date Active Date Inactive Code Status Order ID Comments User Context   01/21/2020 1833 01/30/2020 1900 Full Code [id number]  Ivor Costa, MD Inpatient   12/27/2019 1413 01/03/2020 1931 Full Code [id number]  Collier Bullock, MD ED   04/25/2019 0032 04/26/2019 2018 Full Code [id number]  Bernadette Hoit, DO Inpatient   04/24/2019 2311 04/25/2019 0032 Full Code [id number]  Bernadette Hoit, DO Inpatient   04/24/2019 0127 04/24/2019 1952 Full Code [id number]  Lance Coon, MD Inpatient   04/14/2019 1325 04/20/2019 1926 Full Code [id number]  Lady Deutscher, MD Inpatient   04/02/2019 0123 04/03/2019 1848 Full Code [id number]  Mansy, Arvella Merles, MD ED   03/30/2019 2349 04/01/2019 1809 Full Code [id number]  Mayer Camel, NP ED   03/06/2019 2256 03/07/2019 2039 Full Code [id number]  Mayer Camel, NP ED   02/14/2019 0456 02/15/2019 2030 Full Code [id number]  Harrie Foreman, MD Inpatient   02/02/2019 1729 02/04/2019 1804 Full Code [id number]  Hillary Bow, MD ED   01/03/2019 1944 01/04/2019 2003 Full Code [id number]  Salary, Avel Peace, MD Inpatient   02/23/2018 0126 02/24/2018 1539 Full Code [id number]  Lance Coon, MD ED   04/22/2017 0004 04/22/2017 2213 Full Code [id number]  Lance Coon, MD Inpatient   04/17/2017 2234 04/19/2017 2005 Full Code [id number]  [name], MD Inpatient   04/06/2016 1907 04/10/2016 1928 Full Code [id number]  Loletha Grayer, MD ED   Advance Care Planning Activity      Disposition Plan: Physical therapy recommended rehab which may be difficult to do.  Will consult transitional care team.  Awaiting stool for C. difficile to be resulted back.  Patient would likely  have to leave the hospital with Foley catheter.  Would like to see the patient stronger prior to disposition  Time spent: 28 minutes  [place]

## 2020-02-07 NOTE — ED Notes (Signed)
Pt given breakfast tray

## 2020-02-07 NOTE — ED Notes (Signed)
Pt had BM- pt brief and chuck pads changed- pt given clean blankets

## 2020-02-07 NOTE — Evaluation (Signed)
Physical Therapy Evaluation Patient Details Name: Gregory Moody MRN: [id number] DOB: 1976/01/17 Today's Date: 02/07/2020   History of Present Illness  Per MD notes: Pt is a 44 y.o. 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 difficile colitis, undergoing colonoscopy with negative biopsies, with hospitalization being complicated by acute urinary retention requiring temporary Foley placement. Pt presents to the emergency room with persistent weakness continued nausea with decreased oral intake and lower abdominal discomfort.  MD assessment includes: general weakness, diabetic gastroparesis, malnutrition, AKI, acute urinary retention, hyperglycemia, and chronic diarrhea with history of recurrent Clostridium difficile colitis.    Clinical Impression  Pt pleasant and motivated to participate during the session.  Pt cachectic in appearance and presented with significant functional weakness.  Pt required physical assistance with bed mobility tasks and during amb.  Pt was only able to take several effortful steps at the EOB but with poor gait pattern including poor clearance, continuous B knee flex, and heavy lean on the RW with flexed trunk posture.  Pt has family support at home but is so functionally weak that returning to his prior living situation at this time would not be safe.  Pt will benefit from PT services in a SNF setting upon discharge to safely address deficits listed in patient problem list for decreased caregiver assistance and eventual return to PLOF.      Follow Up Recommendations SNF;Supervision for mobility/OOB    Equipment Recommendations  None recommended by PT    Recommendations for Other Services        Precautions / Restrictions Precautions Precautions: Fall Restrictions Weight Bearing Restrictions: No      Mobility  Bed Mobility Overal bed mobility: Needs Assistance Bed Mobility: Supine to Sit;Sit to Supine     Supine to sit: Min assist Sit to supine: Min assist   General bed mobility comments: Min A for trunk control with bed mobility tasks  Transfers Overall transfer level: Needs assistance Equipment used: Rolling walker (2 wheeled) Transfers: Sit to/from Stand Sit to Stand: Min guard;From elevated surface            Ambulation/Gait Ambulation/Gait assistance: Herbalist (Feet): 2 Feet Assistive device: Rolling walker (2 wheeled) Gait Pattern/deviations: Step-to pattern;Trunk flexed;Decreased step length - right;Decreased step length - left Gait velocity: decreased   General Gait Details: Pt only able to take several small steps with B knees flexed with difficulty clearing the floor when advancing his LE's  Stairs            Wheelchair Mobility    Modified Rankin (Stroke Patients Only)       Balance Overall balance assessment: Needs assistance Sitting-balance support: Feet supported;Bilateral upper extremity supported Sitting balance-Leahy Scale: Fair     Standing balance support: During functional activity;Bilateral upper extremity supported Standing balance-Leahy Scale: Fair Standing balance comment: heavy lean on the RW for support                             Pertinent Vitals/Pain Pain Assessment: No/denies pain    Home Living Family/patient expects to be discharged to:: Private residence Living Arrangements: Parent;Other relatives Available Help at Discharge: Family;Available 24 hours/day Type of Home: Mobile home Home Access: Stairs to enter Entrance Stairs-Rails: Psychiatric nurse  of Steps: 4 Home Layout: One level Home Equipment: [place] - 2 wheels;Cane - quad;Cane - single point       Prior Function Level of Independence: Independent with assistive device(s)         Comments: Mod Ind amb with QC, frequent falls in the last month secondary to weakness     Hand Dominance   Dominant Hand: Right    Extremity/Trunk Assessment   Upper Extremity Assessment Upper Extremity Assessment: Generalized weakness    Lower Extremity Assessment Lower Extremity Assessment: Generalized weakness       Communication   Communication: No difficulties  Cognition Arousal/Alertness: Awake/alert Behavior During Therapy: WFL for tasks assessed/performed Overall Cognitive Status: Within Functional Limits for tasks assessed                                        General Comments      Exercises Other Exercises Other Exercises: Multiple sit to/from stands from elevated EOB with alternating static sitting and static standing to pt's tolerance for increased strength and activity tolerance   Assessment/Plan    PT Assessment Patient needs continued PT services  PT Problem List Decreased strength;Decreased range of motion;Decreased activity tolerance;Decreased balance;Decreased mobility;Decreased knowledge of use of DME;Decreased safety awareness       PT Treatment Interventions DME instruction;Gait training;Stair training;Functional mobility training;Therapeutic activities;Therapeutic exercise;Balance training;Neuromuscular re-education;Patient/family education    PT Goals (Current goals can be found in the Care Plan section)  Acute Rehab PT Goals Patient Stated Goal: To get stronger PT Goal Formulation: With patient Time For Goal Achievement: 02/20/20 Potential to Achieve Goals: Fair    Frequency Min 2X/week   Barriers to discharge Inaccessible home environment      Co-evaluation               AM-PAC PT "6 Clicks" Mobility  Outcome Measure Help needed turning from your back to your side while in a flat bed without using bedrails?: A  Little Help needed moving from lying on your back to sitting on the side of a flat bed without using bedrails?: A Little Help needed moving to and from a bed to a chair (including a wheelchair)?: A Little Help needed standing up from a chair using your arms (e.g., wheelchair or bedside chair)?: A Little Help needed to walk in hospital room?: Total Help needed climbing 3-5 steps with a railing? : Total 6 Click Score: 14    End of Session Equipment Utilized During Treatment: Gait belt Activity Tolerance: Patient tolerated treatment well Patient left: in bed;with call bell/phone within reach;with nursing/sitter in room;Other (comment)(side rails up as pt found in ER) Nurse Communication: Mobility status PT Visit Diagnosis: Unsteadiness on feet (R26.81);Muscle weakness (generalized) (M62.81);History of falling (Z91.81);Repeated falls (R29.6);Difficulty in walking, not elsewhere classified (R26.2)    Time: 1120-1150 PT Time Calculation (min) (ACUTE ONLY): 30 min   Charges:   PT Evaluation $PT Eval Moderate Complexity: 1 Mod PT Treatments $Therapeutic Exercise: 8-22 mins        D. Scott Dura Mccormack PT, DPT 02/07/20, 1:24 PM

## 2020-02-08 DIAGNOSIS — R634 Abnormal weight loss: Secondary | ICD-10-CM

## 2020-02-08 LAB — GLUCOSE, CAPILLARY
Glucose-Capillary: 320 mg/dL — ABNORMAL HIGH (ref 70–99)
Glucose-Capillary: 424 mg/dL — ABNORMAL HIGH (ref 70–99)
Glucose-Capillary: 74 mg/dL (ref 70–99)

## 2020-02-08 LAB — SEDIMENTATION RATE: Sed Rate: 89 mm/hr — ABNORMAL HIGH (ref 0–15)

## 2020-02-08 LAB — MRSA PCR SCREENING: MRSA by PCR: NEGATIVE

## 2020-02-08 LAB — URIC ACID: Uric Acid, Serum: 5.4 mg/dL (ref 3.7–8.6)

## 2020-02-08 MED ORDER — INSULIN ASPART 100 UNIT/ML ~~LOC~~ SOLN: 0.0000 [IU] | Freq: Every day | SUBCUTANEOUS | Status: DC

## 2020-02-08 MED ORDER — INSULIN ASPART 100 UNIT/ML ~~LOC~~ SOLN
5.0000 [IU] | Freq: Three times a day (TID) | SUBCUTANEOUS | Status: DC
  Administered 2020-02-08 – 2020-02-09 (×3): 5 [IU] via SUBCUTANEOUS
  Filled 2020-02-08 (×3): qty 1

## 2020-02-08 MED ORDER — INSULIN ASPART 100 UNIT/ML ~~LOC~~ SOLN
20.0000 [IU] | SUBCUTANEOUS | Status: AC
  Administered 2020-02-08: 13:00:00 20 [IU] via SUBCUTANEOUS
  Filled 2020-02-08: qty 1

## 2020-02-08 MED ORDER — MUPIROCIN 2 % EX OINT
1.0000 "application " | TOPICAL_OINTMENT | Freq: Two times a day (BID) | CUTANEOUS | Status: DC
  Administered 2020-02-08 – 2020-02-09 (×3): 1 via NASAL
  Filled 2020-02-08: qty 22

## 2020-02-08 MED ORDER — INSULIN ASPART 100 UNIT/ML ~~LOC~~ SOLN
0.0000 [IU] | Freq: Three times a day (TID) | SUBCUTANEOUS | Status: DC
  Administered 2020-02-08: 18:00:00 8 [IU] via SUBCUTANEOUS
  Administered 2020-02-09: 5 [IU] via SUBCUTANEOUS
  Administered 2020-02-09: 2 [IU] via SUBCUTANEOUS
  Filled 2020-02-08 (×3): qty 1

## 2020-02-08 MED ORDER — CHLORHEXIDINE GLUCONATE CLOTH 2 % EX PADS
6.0000 | MEDICATED_PAD | Freq: Every day | CUTANEOUS | Status: DC
  Administered 2020-02-08 – 2020-02-09 (×2): 6 via TOPICAL

## 2020-02-08 MED ORDER — INSULIN GLARGINE 100 UNIT/ML ~~LOC~~ SOLN
22.0000 [IU] | Freq: Every day | SUBCUTANEOUS | Status: DC
  Administered 2020-02-09: 22 [IU] via SUBCUTANEOUS
  Filled 2020-02-08: qty 0.22

## 2020-02-08 MED ORDER — PANTOPRAZOLE SODIUM 20 MG PO TBEC
20.0000 mg | DELAYED_RELEASE_TABLET | Freq: Every day | ORAL | Status: DC
  Administered 2020-02-08 – 2020-02-09 (×2): 20 mg via ORAL
  Filled 2020-02-08 (×2): qty 1

## 2020-02-08 NOTE — Progress Notes (Signed)
Patients BS is 424mg /dl . Notified Dr. Earleen Newport, awaiting new orders at this time

## 2020-02-08 NOTE — Consult Note (Signed)
Reason for Consult: Rule out arthritis  Referring Physician: Dr. Fabian Moody   HPI: 44 year old [name].  Complicated story.  Long history of diabetes.  Had weight loss.  Tuberculosis 3 years ago.  Treated with 6 months of medicine by his report.  Claims to have been compliant and finished therapy I had weight loss diarrhea and nausea.  Hospitalization 2 months ago.  Had esophageal candidiasis.  Also had abdominal CT with some thickening of the rectum.  Had colonoscopy without evidence of inflammatory bowel disease or infection.  Some diarrhea but stool cultures were negative During that hospitalization had pulmonary nodules on chest CT.  Saw pulmonary.  Component of bronchiectasis and old nodules.  Not biopsied.  Was not febrile at the time to suggest recurrent tuberculosis Had lower extremity weakness and bladder distention.  Had neurology evaluation.  No evidence of cord lesion.  Thought to be multifactorial with his cachexia C. difficile.  Last toxin negative For 6 months he says he has had ulcers over his elbows.  Will sometimes scratch them.  Occasionally the right will drain.  Had a similar lesion over his right forearm and right fourth finger never both knees.  No joint pain or swelling.  Prior CPK normal.  HIV negative.  Elevated sed rate 86 but prior sed rates have been elevated as well.  Mild elevation of CRP ferritin not elevated. Anemic hemoglobin 8.1 but normal platelets at 337.  Creatinine at 1.1. No morning stiffness.  No swollen joints.  No history of podagra.  No history of psoriasis.  No scalp rash  PMH: Diabetes.  Tuberculosis.  SURGICAL HISTORY: No joint surgeries.  Family History: Negative for rheumatic disease  Social History: No cigarettes or alcohol  Allergies: No Known Allergies  Medications:  Scheduled: . Chlorhexidine Gluconate Cloth  6 each Topical Daily  . Chlorhexidine Gluconate Cloth  6 each Topical Q0600  . insulin aspart  0-15  Units Subcutaneous TID WC  . insulin aspart  0-5 Units Subcutaneous QHS  . insulin aspart  5 Units Subcutaneous TID WC  . [START ON 02/09/2020] insulin glargine  22 Units Subcutaneous Daily  . multivitamin with minerals  1 tablet Oral Daily  . mupirocin ointment  1 application Nasal BID  . pantoprazole  20 mg Oral Daily  . Ensure Max Protein  11 oz Oral BID  . tamsulosin  0.4 mg Oral QPC breakfast        ROS: No chest pain.  No shortness of breath.  No hemoptysis.  No recent sinus complaints   PHYSICAL EXAM: Blood pressure 96/67, pulse 70, temperature 98.8 F (37.1 C), temperature source Oral, resp. rate 18, height 5\' 7"  (1.702 m), weight 59 kg, SpO2 100 %. Ill-appearing male cachectic but no acute distress.  Thin.  Sclera clear.  Clear chest.  No murmur.  No visceromegaly.  No significant edema Quarter sized ulcer-like lesion with prominent scaling over the right elbow and less over the left elbow.  Has similar linear lesion over the forearm extensor surface.  Healed lesion over the right fourth finger this just hypopigmented.  Similar quarter size lesion below the right knee.  Similar scaling larger lesion over the left knee. Musculoskeletal good range of motion neck and shoulders.  Wrists MCPs PIPs without synovitis.  Hips move well.  No knee effusions.  Ankles move well  Assessment: Ulcerative lesions over his elbows knees, left wrist right fifth finger.  Does not appear to be psoriatic or tophaceous or rheumatoid  nodules.  Scaling indicates chronicity.  No obvious active purulence. Cachexia Weight loss, diarrhea, recent Candida esophagitis, recent Covid, recent C. difficile Elevated sed rate over several months duration.  Suspect multifactorial Significant diabetes History of tuberculosis 6 months treatment Pulmonary nodules, prior infiltrate.  Appears stable lungs imaging.  Cannot rule out related to current or prior tuberculosis or atypical  Mycobacterium  Recommendations: Dermatology opinion regarding ulcerative lesions. Agree with pulmonary evaluation of pulmonary nodules No clear indication for steroids or other immunosuppressive agents Discussed with attending physician  Gregory Moody 02/08/2020, 1:10 PM

## 2020-02-08 NOTE — Progress Notes (Signed)
I have educated patient on his use of verbage at staff and with this Probation officer, I encouraged him to calm down with his cursing and swearing at this Probation officer . States " Just give me my fucking insulin" ,. Notified charge nurse and ADON Tiffiany , that this patient is verbally abusive toward staff at [facility]

## 2020-02-08 NOTE — Consult Note (Signed)
Pulmonary Medicine          Date: 02/08/2020,   MRN# [id number] Gregory Moody [birth year]     AdmissionWeight: 59 kg                 CurrentWeight: 59 kg      CHIEF COMPLAINT:   Pulmonary nodules   HISTORY OF PRESENT ILLNESS   Gregory Moody is a 44 y.o. hispanic male with a history of DM, DKA, gastroparesis with severe protein calorie malnutrition and treated TB in 2017 for 6 months, he reports being in prison in [place].  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 He had coloscopy/endoscopy, HIV, HTLV1 were all neg- cortisol level was okay. It was thought to be due to DM. He was admitted in March 2021 for epigastric pain and dysphagia with recurrent regurgitation of food and food getting stuck midway in chest post mastication and degluttion,  reports malnourishment chronically to the point where he is unable to care for himself.  He came in to hospital this time due to worsening of his chronic malnourished state, he shares that he has problems controlling his sugars and has blurred vision as sign of hyperglycemia.  He has been having diarreah and constipation post prandially which is contributing to his malnourishment.  He was concerned regarding dehydration and further physical weakness which is the reason he came to hospital.  He had CXR done which shows chornic right sided infiltrate and small left sided focal densisty of unclear significance. He denies having cough, he denies having fevers, he denies shortness of breath or chest discomfort.  He states his respiratory status has not changed in several months nor has he complained of any pulmonary symptoms.  He did say that previously what worked well for him was nexium and that allowed his epigastric pain to subside and he was able to eat  better. He also shares that antibiotics have bothered his stomach in the past and asks to not start any antibiotics unless absolutely necessary due to his ongoing GI issues.      PAST MEDICAL HISTORY   Past Medical History:  Diagnosis Date  . COVID-19   . Diabetes mellitus without complication ([place])   . Gastroparesis   . Tuberculosis      SURGICAL HISTORY   Past Surgical History:  Procedure Laterality Date  . COLONOSCOPY N/A 01/25/2020   Procedure: COLONOSCOPY;  Surgeon: Toledo, Benay Pike, MD;  Location: [facility] ENDOSCOPY;  Service: Gastroenterology;  Laterality: N/A;  . ESOPHAGOGASTRODUODENOSCOPY N/A 02/03/2019   Procedure: ESOPHAGOGASTRODUODENOSCOPY (EGD);  Surgeon: Toledo, Benay Pike, MD;  Location: [facility] ENDOSCOPY;  Service: Gastroenterology;  Laterality: N/A;     FAMILY HISTORY   Family History  Problem Relation Age of Onset  . Diabetes Mother   . Diabetes Father      SOCIAL HISTORY   Social History   Tobacco Use  . Smoking status: Current Some Day Smoker    Types: Cigars  . Smokeless tobacco: Never Used  Substance Use Topics  . Alcohol use: No  . Drug use: No     MEDICATIONS    Home Medication:    Current Medication:  Current Facility-Administered Medications:  .  0.9 %  sodium chloride infusion, , Intravenous, Continuous, Wieting, Richard, MD, Last Rate: 50 mL/hr at 02/07/20 0848, New  Bag at 02/07/20 0848 .  acetaminophen (TYLENOL) tablet 650 mg, 650 mg, Oral, Q6H PRN **OR** acetaminophen (TYLENOL) suppository 650 mg, 650 mg, Rectal, Q6H PRN, Athena Masse, MD .  Chlorhexidine Gluconate Cloth 2 % PADS 6 each, 6 each, Topical, Daily, Wieting, Richard, MD .  Chlorhexidine Gluconate Cloth 2 % PADS 6 each, 6 each, Topical, Q0600, Loletha Grayer, MD, 6 each at 02/08/20 1640 .  diphenoxylate-atropine (LOMOTIL) 2.5-0.025 MG per tablet 1 tablet, 1 tablet, Oral, QID PRN, Leslye Peer, Richard, MD .  insulin aspart (novoLOG) injection 0-15 Units, 0-15 Units,  Subcutaneous, TID WC, Wieting, Richard, MD .  insulin aspart (novoLOG) injection 0-5 Units, 0-5 Units, Subcutaneous, QHS, Wieting, Richard, MD .  insulin aspart (novoLOG) injection 5 Units, 5 Units, Subcutaneous, TID WC, Wieting, Richard, MD .  Derrill Memo ON 02/09/2020] insulin glargine (LANTUS) injection 22 Units, 22 Units, Subcutaneous, Daily, Wieting, Richard, MD .  liver oil-zinc oxide (DESITIN) 40 % ointment, , Topical, BID PRN, Leslye Peer, Richard, MD .  menthol-cetylpyridinium (CEPACOL) lozenge 3 mg, 1 lozenge, Oral, PRN, Loletha Grayer, MD .  multivitamin with minerals tablet 1 tablet, 1 tablet, Oral, Daily, Loletha Grayer, MD, 1 tablet at 02/08/20 [phone] .  mupirocin ointment (BACTROBAN) 2 % 1 application, 1 application, Nasal, BID, Loletha Grayer, MD, 1 application at 09/32/67 1640 .  ondansetron (ZOFRAN) tablet 4 mg, 4 mg, Oral, Q6H PRN **OR** ondansetron (ZOFRAN) injection 4 mg, 4 mg, Intravenous, Q6H PRN, Athena Masse, MD .  pantoprazole (PROTONIX) EC tablet 20 mg, 20 mg, Oral, Daily, Wieting, Richard, MD .  protein supplement (ENSURE MAX) liquid, 11 oz, Oral, BID, Leslye Peer, Richard, MD, 11 oz at 02/08/20 1443 .  tamsulosin (FLOMAX) capsule 0.4 mg, 0.4 mg, Oral, QPC breakfast, Leslye Peer, Richard, MD, 0.4 mg at 02/08/20 1245    ALLERGIES   Patient has no known allergies.     REVIEW OF SYSTEMS    Review of Systems:  Gen:  Denies  fever, sweats, chills weigh loss  HEENT: Denies blurred vision, double vision, ear pain, eye pain, hearing loss, nose bleeds, sore throat Cardiac:  No dizziness, chest pain or heaviness, chest tightness,edema Resp:   Denies cough or sputum porduction, shortness of breath,wheezing, hemoptysis,  Gi: Denies swallowing difficulty, stomach pain, nausea or vomiting, diarrhea, constipation, bowel incontinence Gu:  Denies bladder incontinence, burning urine Ext:   Denies Joint pain, stiffness or swelling Skin: Denies  skin rash, easy bruising or bleeding or  hives Endoc:  Denies polyuria, polydipsia , polyphagia or weight change Psych:   Denies depression, insomnia or hallucinations   Other:  All other systems negative   VS: BP 96/67 (BP Location: Left Arm)   Pulse 70   Temp 98.8 F (37.1 C) (Oral)   Resp 18   Ht 5\' 7"  (1.702 m)   Wt 59 kg   SpO2 100%   BMI 20.36 kg/m      PHYSICAL EXAM    GENERAL:NAD, no fevers, chills, no weakness no fatigue HEAD: Normocephalic, atraumatic.  EYES: Pupils equal, round, reactive to light. Extraocular muscles intact. No scleral icterus.  MOUTH: Moist mucosal membrane. Dentition intact. No abscess noted.  EAR, NOSE, THROAT: Clear without exudates. No external lesions.  NECK: Supple. No thyromegaly. No nodules. No JVD.  PULMONARY: clear to auscultation bilaterally CARDIOVASCULAR: S1 and S2. Regular rate and rhythm. No murmurs, rubs, or gallops. No edema. Pedal pulses 2+ bilaterally.  GASTROINTESTINAL: Soft, nontender, nondistended. No masses. Positive bowel sounds. No hepatosplenomegaly.  MUSCULOSKELETAL: No swelling, clubbing, or  edema. Range of motion full in all extremities. Elbows with hyperkeratotic and hyperpigmented skin lesions likely due to pressure related changes NEUROLOGIC: Cranial nerves II through XII are intact. No gross focal neurological deficits. Sensation intact. Reflexes intact.  SKIN: No ulceration, lesions, rashes, or cyanosis. Skin warm and dry. Turgor intact.  PSYCHIATRIC: Mood, affect within normal limits. The patient is awake, alert and oriented x 3. Insight, judgment intact.       IMAGING    CT ABDOMEN PELVIS WO CONTRAST  Result Date: 01/21/2020 CLINICAL DATA:  Abdominal pain with diarrhea. Recent history of C difficile infection. EXAM: CT ABDOMEN AND PELVIS WITHOUT CONTRAST TECHNIQUE: Multidetector CT imaging of the abdomen and pelvis was performed following the standard protocol without IV contrast. COMPARISON:  CT abdomen and pelvis 04/17/2019. Chest CT 12/29/2019.  FINDINGS: Lower chest: Motion artifact through the lung bases with partially visualized micronodularity in the lower lobes and focal nodular opacity in the lateral aspect of the lingula, overall substantially improved from the recent prior chest CT. No pleural effusion. Hepatobiliary: Punctate calcification in the posterior right hepatic lobe. Unremarkable gallbladder. No biliary dilatation. Pancreas: Unremarkable. Spleen: Unremarkable. Adrenals/Urinary Tract: Unremarkable adrenal glands. No evidence of renal mass, calculi, or hydronephrosis. Markedly distended bladder. Stomach/Bowel: The stomach is mildly distended by ingested material. Relative lack of significant small or large bowel distension limits assessment for wall thickening. There is the suggestion of mild rectal wall thickening with mild haziness in the surrounding fat. There is no evidence of bowel obstruction. The appendix is unremarkable. Vascular/Lymphatic: Normal caliber of the abdominal aorta. No enlarged lymph nodes. Reproductive: Unremarkable prostate. Other: No ascites or pneumoperitoneum. Musculoskeletal: Remote fractures of the left ischium and proximal left femur. Suspected old healed L5 pars defects with grade 1 anterolisthesis of L5 on S1. IMPRESSION: 1. Markedly distended bladder. 2. Mild rectal wall thickening which may reflect proctitis. 3. Improved nodularity in the lung bases since a 12/29/2019 chest CT. Electronically Signed   By: Logan Bores M.D.   On: 01/21/2020 13:38   DG Chest 2 View  Result Date: 02/07/2020 CLINICAL DATA:  Weight loss. EXAM: CHEST - 2 VIEW COMPARISON:  December 27, 2019. December 29, 2019. FINDINGS: The heart size and mediastinal contours are within normal limits. Stable nodule seen laterally in right upper lobe. Interval development of nodular opacity seen in the left upper and lower lobes which may be inflammatory in etiology. The visualized skeletal structures are unremarkable. IMPRESSION: Interval development  of nodular opacities seen in left upper and lower lobes which may be inflammatory in etiology. Follow-up radiographs are recommended to ensure resolution. Electronically Signed   By: Marijo Conception M.D.   On: 02/07/2020 15:50   US Abdomen Limited RUQ  Result Date: 02/07/2020 CLINICAL DATA:  Chronic diarrhea EXAM: ULTRASOUND ABDOMEN LIMITED RIGHT UPPER QUADRANT COMPARISON:  CT 01/21/2020 FINDINGS: Gallbladder: Small 4 mm polyp. Gallbladder slightly contracted. Borderline wall thickening. No sonographic Percell Miller indicated. Common bile duct: Diameter: 4 mm Liver: No focal lesion identified. Within normal limits in parenchymal echogenicity. Portal vein is patent on color Doppler imaging with normal direction of blood flow towards the liver. Other: None. IMPRESSION: 1. Contracted appearing gallbladder with small polyp. No definitive sonographic features to suggest acute gallbladder disease. 2. Otherwise negative right upper quadrant abdominal ultrasound Electronically Signed   By: Donavan Foil M.D.   On: 02/07/2020 22:21         ASSESSMENT/PLAN   abnormal CT chest  - patient apparently has hx of TB  s/p treatment -patient states he follow up with pulmonary next to [facility] and he also has primary care doctor there.  He shares that he has set up charity care there and that is why he goes there.   -He states he feels better and his main problem now is diarreah/constipation and inability to eat due to gastritis which he states has been worked up before and was explain its from his DM and pancreatic insufficiency -He had Right upper lung lesion from several years ago - which when reviewed from previous appears stable.  There is small lesion on left chest cxr which was present on previous CT chest.   He is on room air and states he can walk no problem .   - I would recommend outpatient surveillance and follow up and return to his previous pulmonologist to evaluate any concern for potential  mycobacterium avium related chronic lung disease. Patient is hemodynamically stable, without cough and denies any respiratory discomfort.      Thank you for allowing me to participate in the care of this patient.    Patient/Family are satisfied with care plan and all questions have been answered.  This document was prepared using Dragon voice recognition software and may include unintentional dictation errors.     Ottie Glazier, M.D.  Division of [place]

## 2020-02-08 NOTE — Progress Notes (Signed)
Patient ID: Gregory Moody, male   DOB: 04/01/1976, 44 y.o.   MRN: [id number] [facility] PROGRESS NOTE  Gregory Moody LFY:[id number] DOB: [birth year] DOA: 02/06/2020 PCP: [place]  HPI/Subjective: Patient feeling better.  Still having diarrhea.  No shortness of breath.  Patient states that he is urinating.  He had urinary retention upon coming in but wanted the Foley catheter out yesterday.  Objective: Vitals:   02/08/20 0044 02/08/20 0818  BP: 119/86 96/67  Pulse: 71 70  Resp: 18 18  Temp: 98.7 F (37.1 C) 98.8 F (37.1 C)  SpO2: 99% 100%    Intake/Output Summary (Last 24 hours) at 02/08/2020 1519 Last data filed at 02/08/2020 1019 Gross per 24 hour  Intake 2649.9 ml  Output 702 ml  Net 1947.9 ml   Filed Weights   02/06/20 1710  Weight: 59 kg    ROS: Review of Systems  Constitutional: Positive for weight loss. Negative for fever.  Eyes: Negative for blurred vision.  Respiratory: Negative for cough and shortness of breath.   Cardiovascular: Negative for chest pain.  Gastrointestinal: Positive for abdominal pain, diarrhea and nausea. Negative for constipation and vomiting.  Genitourinary: Negative for dysuria.  Musculoskeletal: Negative for joint pain.  Neurological: Negative for dizziness.   Exam: Physical Exam  Constitutional: He is oriented to person, place, and time.  HENT:  Nose: No mucosal edema.  Mouth/Throat: No oropharyngeal exudate or posterior oropharyngeal edema.  Eyes: Conjunctivae and lids are normal.  Neck: Carotid bruit is not present.  Cardiovascular: S1 normal and S2 normal. Exam reveals no gallop.  No murmur heard. Respiratory: No respiratory distress. He has decreased breath sounds in the right lower field and the left lower field. He has no wheezes. He has no rhonchi. He has no rales.  GI: Soft. Bowel sounds are normal. There is no abdominal tenderness.  Musculoskeletal:     Right ankle: No swelling.      Left ankle: No swelling.  Lymphadenopathy:    He has no cervical adenopathy.  Neurological: He is alert and oriented to person, place, and time. No cranial nerve deficit.  Skin: Skin is warm. Nails show no clubbing.  Numerous areas of ulceration on legs, elbows.   Psychiatric: He has a normal mood and affect.      Data Reviewed: Basic Metabolic Panel: Recent Labs  Lab 02/06/20 1711 02/07/20 0522  NA 125* 135  K 5.1 4.8  CL 97* 110  CO2 18* 21*  GLUCOSE 661* 297*  BUN 33* 24*  CREATININE 1.50* 1.10  CALCIUM 8.3* 8.3*   Liver Function Tests: Recent Labs  Lab 02/06/20 1711  AST 23  ALT 62*  ALKPHOS 117  BILITOT 0.8  PROT 7.6  ALBUMIN 3.3*   Recent Labs  Lab 02/06/20 1711  LIPASE 42   CBC: Recent Labs  Lab 02/06/20 1711 02/07/20 0522  WBC 9.3 8.3  HGB 7.7* 8.1*  HCT 24.7* 24.1*  MCV 99.6 94.1  PLT 372 337   BNP (last 3 results) Recent Labs    04/19/19 0300 04/20/19 0500 04/20/19 1108  BNP 450.9* SPECIMEN(S) LOST IN TRANSIT DUPLICATE  852.7*    CBG: Recent Labs  Lab 02/07/20 1245 02/07/20 1636 02/07/20 2117 02/08/20 0814 02/08/20 1228  GLUCAP 300* 298* 177* 320* 424*    Recent Results (from the past 240 hour(s))  Respiratory Panel by RT PCR (Flu A&B, Covid) - Nasopharyngeal Swab     Status: None   Collection Time: 02/06/20 10:25 PM  Specimen: Nasopharyngeal Swab  Result Value Ref Range Status   SARS Coronavirus 2 by RT PCR NEGATIVE NEGATIVE Final    Comment: (NOTE) SARS-CoV-2 target nucleic acids are NOT DETECTED. The SARS-CoV-2 RNA is generally detectable in upper respiratoy specimens during the acute phase of infection. The lowest concentration of SARS-CoV-2 viral copies this assay can detect is 131 copies/mL. A negative result does not preclude SARS-Cov-2 infection and should not be used as the sole basis for treatment or other patient management decisions. A negative result may occur with  improper specimen collection/handling,  submission of specimen other than nasopharyngeal swab, presence of viral mutation(s) within the areas targeted by this assay, and inadequate number of viral copies (<131 copies/mL). A negative result must be combined with clinical observations, patient history, and epidemiological information. The expected result is Negative. Fact Sheet for Patients:  PinkCheek.be Fact Sheet for Healthcare Providers:  GravelBags.it This test is not yet ap proved or cleared by the [place] FDA and  has been authorized for detection and/or diagnosis of SARS-CoV-2 by FDA under an Emergency Use Authorization (EUA). This EUA will remain  in effect (meaning this test can be used) for the duration of the COVID-19 declaration under Section 564(b)(1) of the Act, 21 U.S.C. section 360bbb-3(b)(1), unless the authorization is terminated or revoked sooner.    Influenza A by PCR NEGATIVE NEGATIVE Final   Influenza B by PCR NEGATIVE NEGATIVE Final    Comment: (NOTE) The Xpert Xpress SARS-CoV-2/FLU/RSV assay is intended as an aid in  the diagnosis of influenza from Nasopharyngeal swab specimens and  should not be used as a sole basis for treatment. Nasal washings and  aspirates are unacceptable for Xpert Xpress SARS-CoV-2/FLU/RSV  testing. Fact Sheet for Patients: PinkCheek.be Fact Sheet for Healthcare Providers: GravelBags.it This test is not yet approved or cleared by the [place] FDA and  has been authorized for detection and/or diagnosis of SARS-CoV-2 by  FDA under an Emergency Use Authorization (EUA). This EUA will remain  in effect (meaning this test can be used) for the duration of the  Covid-19 declaration under Section 564(b)(1) of the Act, 21  U.S.C. section 360bbb-3(b)(1), unless the authorization is  terminated or revoked. Performed at [facility], [place], [place] [address]   C Difficile Quick Screen w PCR reflex     Status: None   Collection Time: 02/07/20 11:52 AM   Specimen: STOOL  Result Value Ref Range Status   C Diff antigen NEGATIVE NEGATIVE Final   C Diff toxin NEGATIVE NEGATIVE Final   C Diff interpretation No C. difficile detected.  Final    Comment: Performed at [facility], [address] [place], [place] [address]  MRSA PCR Screening     Status: None   Collection Time: 02/08/20 10:51 AM   Specimen: Nasopharyngeal  Result Value Ref Range Status   MRSA by PCR NEGATIVE NEGATIVE Final    Comment:        The GeneXpert MRSA Assay (FDA approved for NASAL specimens only), is one component of a comprehensive MRSA colonization surveillance program. It is not intended to diagnose MRSA infection nor to guide or monitor treatment for MRSA infections. Performed at [facility], [address] [place], [place] [address]      Scheduled Meds: . Chlorhexidine Gluconate Cloth  6 each Topical Daily  . Chlorhexidine Gluconate Cloth  6 each Topical Q0600  . insulin aspart  0-15 Units Subcutaneous TID WC  .  insulin aspart  0-5 Units Subcutaneous QHS  . insulin aspart  5 Units Subcutaneous TID WC  . [START ON 02/09/2020] insulin glargine  22 Units Subcutaneous Daily  . multivitamin with minerals  1 tablet Oral Daily  . mupirocin ointment  1 application Nasal BID  . pantoprazole  20 mg Oral Daily  . Ensure Max Protein  11 oz Oral BID  . tamsulosin  0.4 mg Oral QPC breakfast   Continuous Infusions: . sodium chloride 50 mL/hr at 02/07/20 0848    Assessment/Plan:  1. Uncontrolled type 2 diabetes mellitus with hyperglycemia. Since patient is eating better, his sugars are now elevated again.  Increase Lantus to 22 units and sliding scale insulin.  Sugar was 661 on presentation.  Hemoglobin A1c 10.8. 2. Generalized weakness.  Physical therapy recommends rehab.  Patient would like to go home  tomorrow. 3. Acute urinary retention Foley catheter placed in ER but he wanted it removed.  Continue Flomax.  Continue watching for urinary retention. 4. Acute kidney injury improved with IV fluids 5. 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  Repeat C. difficile testing negative. 6. Abnormal CT scan of the chest last month.  Abnormal chest x-ray.  Case discussed with Dr. Lanney Gins to see the patient.  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  Appreciate Dr. Precious Reel input.  He does not believe that the patient has rheumatoid arthritis. 7. Severe malnutrition 8. Anemia likely iron deficiency.  Code Status:     Code Status Orders  (From admission, onward)         Start     Ordered   02/06/20 2247  Full code  Continuous     02/06/20 2250        Code Status History    Date Active Date Inactive Code Status Order ID Comments User Context   01/21/2020 1833 01/30/2020 1900 Full Code [id number]  Ivor Costa, MD Inpatient   12/27/2019 1413 01/03/2020 1931 Full Code [id number]  Collier Bullock, MD ED   04/25/2019 0032 04/26/2019 2018 Full Code [id number]  Bernadette Hoit, DO Inpatient   04/24/2019 2311 04/25/2019 0032 Full Code [id number]  Bernadette Hoit, DO Inpatient   04/24/2019 0127 04/24/2019 1952 Full Code [id number]  Lance Coon, MD Inpatient   04/14/2019 1325 04/20/2019 1926 Full Code [id number]  Lady Deutscher, MD Inpatient   04/02/2019 0123 04/03/2019 1848 Full Code [id number]  Mansy, Arvella Merles, MD ED   03/30/2019 2349 04/01/2019 1809 Full Code [id number]  Mayer Camel, NP ED   03/06/2019 2256 03/07/2019 2039 Full Code [id number]  Mayer Camel, NP ED   02/14/2019 0456 02/15/2019 2030 Full Code [id number]  Harrie Foreman, MD Inpatient   02/02/2019 1729 02/04/2019 1804 Full Code [id number]  Hillary Bow, MD ED   01/03/2019 1944 01/04/2019 2003 Full Code [id number]  Salary, Avel Peace, MD Inpatient   02/23/2018 0126 02/24/2018 1539 Full Code [id number]   Lance Coon, MD ED   04/22/2017 0004 04/22/2017 2213 Full Code [id number]  Lance Coon, MD Inpatient   04/17/2017 2234 04/19/2017 2005 Full Code [id number]  [name], MD Inpatient   04/06/2016 1907 04/10/2016 1928 Full Code [id number]  Loletha Grayer, MD ED   Advance Care Planning Activity      Disposition Plan: Appreciate rheumatology consultation.  Awaiting pulmonary consultation on abnormal chest x-ray.  Do not want to give antibiotics since he does have history of C. difficile.  Hopefully will be able to  discharge patient home tomorrow with home health.  Need to watch make sure the patient is urinating without requiring in and out catheterizations.  Time spent: 32 minutes, case discussed with rheumatology and pulmonary  [place]  [facility]

## 2020-02-08 NOTE — Progress Notes (Signed)
Inpatient Diabetes Program Recommendations  AACE/ADA: New Consensus Statement on Inpatient Glycemic Control (2015)  Target Ranges:  Prepandial:   less than 140 mg/dL      Peak postprandial:   less than 180 mg/dL (1-2 hours)      Critically ill patients:  140 - 180 mg/dL   Lab Results  Component Value Date   GLUCAP 320 (H) 02/08/2020   HGBA1C 10.8 (H) 02/07/2020    Review of Glycemic Control  Results for Gregory Moody, Gregory Moody (MRN [id number]) as of 02/08/2020 10:11  Ref. Range 02/07/2020 08:07 02/07/2020 12:45 02/07/2020 16:36 02/07/2020 21:17 02/08/2020 08:14  Glucose-Capillary Latest Ref Range: 70 - 99 mg/dL 294 (H) 300 (H) 298 (H) 177 (H) 320 (H)   Diabetes history: DM2  Outpatient Diabetes medications: 70/30 30 units BID   Current orders for Inpatient glycemic control: Lantus 18 units daily + Novolog 0-9 units TID + 0-5 QHS + Novolog 3 units meal coverage TID   Inpatient Diabetes Program Recommendations:     Lantus 22 units daily  Novolog 0-15 units TID Novolog meal coverage 5 units if eats at least 50% of meal  Thank you, Reche Dixon, RN, BSN Diabetes Coordinator Inpatient Diabetes Program [phone] (team pager from 8a-5p)

## 2020-02-09 DIAGNOSIS — D509 Iron deficiency anemia, unspecified: Secondary | ICD-10-CM

## 2020-02-09 LAB — GLUCOSE, CAPILLARY
Glucose-Capillary: 297 mg/dL — ABNORMAL HIGH (ref 70–99)
Glucose-Capillary: 234 mg/dL — ABNORMAL HIGH (ref 70–99)
Glucose-Capillary: 139 mg/dL — ABNORMAL HIGH (ref 70–99)

## 2020-02-09 LAB — ANA W/REFLEX IF POSITIVE: Anti Nuclear Antibody (ANA): NEGATIVE

## 2020-02-09 LAB — RHEUMATOID FACTOR: Rheumatoid fact SerPl-aCnc: <10 IU/mL (ref 0.0–13.9)

## 2020-02-09 MED ORDER — ENSURE MAX PROTEIN PO LIQD: 11.0000 [oz_av] | Freq: Two times a day (BID) | ORAL | 0 refills | Status: DC

## 2020-02-09 MED ORDER — COLESEVELAM HCL 625 MG PO TABS: 1875.0000 mg | ORAL_TABLET | Freq: Two times a day (BID) | ORAL | 0 refills | Status: DC

## 2020-02-09 MED ORDER — FLUOXETINE HCL 10 MG PO CAPS: 10.0000 mg | ORAL_CAPSULE | Freq: Every day | ORAL | 0 refills | Status: DC

## 2020-02-09 MED ORDER — INSULIN NPH ISOPHANE & REGULAR (70-30) 100 UNIT/ML ~~LOC~~ SUSP: 30.0000 [IU] | Freq: Two times a day (BID) | SUBCUTANEOUS | 11 refills | Status: DC

## 2020-02-09 MED ORDER — LOPERAMIDE HCL 2 MG PO CAPS: 2.0000 mg | ORAL_CAPSULE | Freq: Two times a day (BID) | ORAL | 0 refills | Status: DC | PRN

## 2020-02-09 MED ORDER — ZINC OXIDE 40 % EX OINT: TOPICAL_OINTMENT | Freq: Two times a day (BID) | CUTANEOUS | 0 refills | Status: DC | PRN

## 2020-02-09 MED ORDER — TAMSULOSIN HCL 0.4 MG PO CAPS: 0.4000 mg | ORAL_CAPSULE | Freq: Every day | ORAL | 0 refills | Status: DC

## 2020-02-09 MED ORDER — ADULT MULTIVITAMIN W/MINERALS CH: 1.0000 | ORAL_TABLET | Freq: Every day | ORAL | 0 refills | Status: DC

## 2020-02-09 NOTE — Progress Notes (Signed)
Patient is being discharged home with home health this afternoon. Belongings packed and NT helped patient dress. DC & Rx instructions given and patient acknowledged understanding. IV removed. Friend will pick up patient after 1pm

## 2020-02-09 NOTE — Discharge Summary (Signed)
[facility] NAME: Gregory Moody    MR#:  [id number]  DATE OF BIRTH:  01-11-1976  DATE OF ADMISSION:  02/06/2020 ADMITTING PHYSICIAN: Loletha Grayer, MD  DATE OF DISCHARGE: 02/09/2020  1:21 PM  PRIMARY CARE PHYSICIAN: [place]    ADMISSION DIAGNOSIS:  Hyperglycemia [R73.9] Generalized weakness [R53.1] Diarrhea [R19.7]  DISCHARGE DIAGNOSIS:  Principal Problem:   Generalized weakness Active Problems:   Malnutrition ([place])   Diabetic gastroparesis ([place])   AKI (acute kidney injury) ([place])   Diarrhea   Acute urinary retention   Hyperglycemia due to type 2 diabetes mellitus (HCC)   History of Clostridium difficile colitis   History of COVID-19   Chronic diarrhea   Abnormal CT of the chest   Weight loss   SECONDARY DIAGNOSIS:   Past Medical History:  Diagnosis Date  . COVID-19   . Diabetes mellitus without complication ([place])   . Gastroparesis   . Tuberculosis     HOSPITAL COURSE:   1.  Uncontrolled type 2 diabetes mellitus with hyperglycemia.  Patient's blood sugar when he came in was 661.  His hemoglobin A1c is elevated at 10.8.  In the hospital I switched him over the Lantus insulin 22 units and sliding scale insulin.  He wants to go back on his 70/30 insulin at 30 units twice a day because he has that at home. 2.  Generalized weakness.  Physical therapy recommends rehab.  Without insurance he will have to go home with home health. 3.  Acute urinary retention.  Foley catheter was placed in the emergency room but he wanted it removed.  I started Flomax.  The patient states that he is urinating. 4.  Acute kidney injury.  This has improved with IV fluid hydration. 5.  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  Repeat CT if testing negative.  Can start Imodium.  Keep a log of what foods may be causing diarrhea.  CT scan of the abdomen negative. 6.  Abnormal CT scan of the chest last month  and abnormal chest x-ray here.  Patient has had a history of tuberculosis and was treated.  Follow-up as outpatient.  Can consider looking into MAI.  Seen by Dr. Lanney Gins here and patient will follow up with pulmonary through the [facility] clinic. 7.  Severe protein calorie malnutrition.  Prescribed Ensure max 8.  Iron deficiency anemia.  Last hemoglobin 8.1 9.  Numerous healing skin lesions on elbows and lower extremities.  Appreciate rheumatology consultation they do not believe anything rheumatological is going on here.  Rheumatoid factor negative.  ANA is also negative. 10.  I advised the patient to follow-up at the [facility] clinic.  DISCHARGE CONDITIONS:   Satisfactory  CONSULTS OBTAINED:  Treatment Team:  Ottie Glazier, MD Emmaline Kluver., MD  DRUG ALLERGIES:  No Known Allergies  DISCHARGE MEDICATIONS:   Allergies as of 02/09/2020   No Known Allergies     Medication List    STOP taking these medications   aspirin EC 81 MG tablet   fluconazole 200 MG tablet Commonly known as: DIFLUCAN   insulin aspart 100 UNIT/ML injection Commonly known as: novoLOG   insulin glargine 100 UNIT/ML injection Commonly known as: LANTUS   sucralfate 1 g tablet Commonly known as: CARAFATE     TAKE these medications   colesevelam 625 MG tablet Commonly known as: WELCHOL Take 3 tablets (1,875 mg total) by mouth 2 (two) times daily with  a meal.   Ensure Max Protein Liqd Take 330 mLs (11 oz total) by mouth 2 (two) times daily.   FLUoxetine 10 MG capsule Commonly known as: PROZAC Take 1 capsule (10 mg total) by mouth daily.   insulin NPH-regular Human (70-30) 100 UNIT/ML injection Inject 30 Units into the skin 2 (two) times daily with a meal. Start taking on: Feb 10, 2020   liver oil-zinc oxide 40 % ointment Commonly known as: DESITIN Apply topically 2 (two) times daily as needed for irritation.   loperamide 2 MG capsule Commonly known as: IMODIUM Take 1 capsule (2 mg  total) by mouth 2 (two) times daily as needed for diarrhea or loose stools. What changed: when to take this   multivitamin with minerals Tabs tablet Take 1 tablet by mouth daily.   tamsulosin 0.4 MG Caps capsule Commonly known as: FLOMAX Take 1 capsule (0.4 mg total) by mouth daily after breakfast. Start taking on: Feb 10, 2020      Patient requested Nexium 20 mg daily #30 no refills called into pharmacy  DISCHARGE INSTRUCTIONS:   Satisfactory  If you experience worsening of your admission symptoms, develop shortness of breath, life threatening emergency, suicidal or homicidal thoughts you must seek medical attention immediately by calling 911 or calling your MD immediately  if symptoms less severe.  You Must read complete instructions/literature along with all the possible adverse reactions/side effects for all the Medicines you take and that have been prescribed to you. Take any new Medicines after you have completely understood and accept all the possible adverse reactions/side effects.   Please note  You were cared for by a hospitalist during your hospital stay. If you have any questions about your discharge medications or the care you received while you were in the hospital after you are discharged, you can call the unit and asked to speak with the hospitalist on call if the hospitalist that took care of you is not available. Once you are discharged, your primary care physician will handle any further medical issues. Please note that NO REFILLS for any discharge medications will be authorized once you are discharged, as it is imperative that you return to your primary care physician (or establish a relationship with a primary care physician if you do not have one) for your aftercare needs so that they can reassess your need for medications and monitor your lab values.    Today   CHIEF COMPLAINT:   Chief Complaint  Patient presents with  . Hyperglycemia    HISTORY OF PRESENT  ILLNESS:  Gregory Moody  is a 44 y.o. male with a known history of diabetes presents with weakness and elevated blood sugar.   VITAL SIGNS:  Blood pressure 112/80, pulse 76, temperature 98.6 F (37 C), temperature source Oral, resp. rate 16, height 5\' 7"  (1.702 m), weight 59 kg, SpO2 100 %.   PHYSICAL EXAMINATION:  GENERAL:  44 y.o.-year-old patient lying in the bed with no acute distress.  EYES: Pupils equal, round, reactive to light and accommodation. No scleral icterus. HEENT: Head atraumatic, normocephalic. Oropharynx and nasopharynx clear.  LUNGS: Normal breath sounds bilaterally, no wheezing, rales,rhonchi or crepitation. No use of accessory muscles of respiration.  CARDIOVASCULAR: S1, S2 normal. No murmurs, rubs, or gallops.  ABDOMEN: Soft, non-tender, non-distended. Bowel sounds present.  EXTREMITIES: No pedal edema, cyanosis, or clubbing.  NEUROLOGIC: Cranial nerves II through XII are intact. Sensation intact. Gait not checked.  PSYCHIATRIC: The patient is alert and oriented x 3.  SKIN: Numerous areas of scabs and ulcerations on his elbows and lower extremities.  On the buttock also had little irritation.  I would not call it it decubiti.  DATA REVIEW:   CBC Recent Labs  Lab 02/07/20 0522  WBC 8.3  HGB 8.1*  HCT 24.1*  PLT 337    Chemistries  Recent Labs  Lab 02/06/20 1711 02/06/20 1711 02/07/20 0522  NA 125*   < > 135  K 5.1   < > 4.8  CL 97*   < > 110  CO2 18*   < > 21*  GLUCOSE 661*   < > 297*  BUN 33*   < > 24*  CREATININE 1.50*   < > 1.10  CALCIUM 8.3*   < > 8.3*  AST 23  --   --   ALT 62*  --   --   ALKPHOS 117  --   --   BILITOT 0.8  --   --    < > = values in this interval not displayed.    Microbiology Results  Results for orders placed or performed during the hospital encounter of 02/06/20  Respiratory Panel by RT PCR (Flu A&B, Covid) - Nasopharyngeal Swab     Status: None   Collection Time: 02/06/20 10:25 PM   Specimen:  Nasopharyngeal Swab  Result Value Ref Range Status   SARS Coronavirus 2 by RT PCR NEGATIVE NEGATIVE Final    Comment: (NOTE) SARS-CoV-2 target nucleic acids are NOT DETECTED. The SARS-CoV-2 RNA is generally detectable in upper respiratoy specimens during the acute phase of infection. The lowest concentration of SARS-CoV-2 viral copies this assay can detect is 131 copies/mL. A negative result does not preclude SARS-Cov-2 infection and should not be used as the sole basis for treatment or other patient management decisions. A negative result may occur with  improper specimen collection/handling, submission of specimen other than nasopharyngeal swab, presence of viral mutation(s) within the areas targeted by this assay, and inadequate number of viral copies (<131 copies/mL). A negative result must be combined with clinical observations, patient history, and epidemiological information. The expected result is Negative. Fact Sheet for Patients:  PinkCheek.be Fact Sheet for Healthcare Providers:  GravelBags.it This test is not yet ap proved or cleared by the [place] FDA and  has been authorized for detection and/or diagnosis of SARS-CoV-2 by FDA under an Emergency Use Authorization (EUA). This EUA will remain  in effect (meaning this test can be used) for the duration of the COVID-19 declaration under Section 564(b)(1) of the Act, 21 U.S.C. section 360bbb-3(b)(1), unless the authorization is terminated or revoked sooner.    Influenza A by PCR NEGATIVE NEGATIVE Final   Influenza B by PCR NEGATIVE NEGATIVE Final    Comment: (NOTE) The Xpert Xpress SARS-CoV-2/FLU/RSV assay is intended as an aid in  the diagnosis of influenza from Nasopharyngeal swab specimens and  should not be used as a sole basis for treatment. Nasal washings and  aspirates are unacceptable for Xpert Xpress SARS-CoV-2/FLU/RSV  testing. Fact Sheet for  Patients: PinkCheek.be Fact Sheet for Healthcare Providers: GravelBags.it This test is not yet approved or cleared by the [place] FDA and  has been authorized for detection and/or diagnosis of SARS-CoV-2 by  FDA under an Emergency Use Authorization (EUA). This EUA will remain  in effect (meaning this test can be used) for the duration of the  Covid-19 declaration under Section 564(b)(1) of the Act, 21  U.S.C. section 360bbb-3(b)(1), unless the authorization is  terminated or revoked. Performed at [facility], [place], [place] [address]   C Difficile Quick Screen w PCR reflex     Status: None   Collection Time: 02/07/20 11:52 AM   Specimen: STOOL  Result Value Ref Range Status   C Diff antigen NEGATIVE NEGATIVE Final   C Diff toxin NEGATIVE NEGATIVE Final   C Diff interpretation No C. difficile detected.  Final    Comment: Performed at [facility], [address] [place], [place] [address]  MRSA PCR Screening     Status: None   Collection Time: 02/08/20 10:51 AM   Specimen: Nasopharyngeal  Result Value Ref Range Status   MRSA by PCR NEGATIVE NEGATIVE Final    Comment:        The GeneXpert MRSA Assay (FDA approved for NASAL specimens only), is one component of a comprehensive MRSA colonization surveillance program. It is not intended to diagnose MRSA infection nor to guide or monitor treatment for MRSA infections. Performed at [facility], [address] [place], [place] [address]     RADIOLOGY:  US Abdomen Limited RUQ  Result Date: 02/07/2020 CLINICAL DATA:  Chronic diarrhea EXAM: ULTRASOUND ABDOMEN LIMITED RIGHT UPPER QUADRANT COMPARISON:  CT 01/21/2020 FINDINGS: Gallbladder: Small 4 mm polyp. Gallbladder slightly contracted. Borderline wall thickening. No sonographic Percell Miller indicated. Common bile duct: Diameter: 4 mm Liver: No focal lesion identified. Within  normal limits in parenchymal echogenicity. Portal vein is patent on color Doppler imaging with normal direction of blood flow towards the liver. Other: None. IMPRESSION: 1. Contracted appearing gallbladder with small polyp. No definitive sonographic features to suggest acute gallbladder disease. 2. Otherwise negative right upper quadrant abdominal ultrasound Electronically Signed   By: Donavan Foil M.D.   On: 02/07/2020 22:21      Management plans discussed with the patient, and he is in agreement.  CODE STATUS:     Code Status Orders  (From admission, onward)         Start     Ordered   02/06/20 2247  Full code  Continuous     02/06/20 2250        Code Status History    Date Active Date Inactive Code Status Order ID Comments User Context   01/21/2020 1833 01/30/2020 1900 Full Code [id number]  Ivor Costa, MD Inpatient   12/27/2019 1413 01/03/2020 1931 Full Code [id number]  Collier Bullock, MD ED   04/25/2019 0032 04/26/2019 2018 Full Code [id number]  Bernadette Hoit, DO Inpatient   04/24/2019 2311 04/25/2019 0032 Full Code [id number]  Bernadette Hoit, DO Inpatient   04/24/2019 0127 04/24/2019 1952 Full Code [id number]  Lance Coon, MD Inpatient   04/14/2019 1325 04/20/2019 1926 Full Code [id number]  Lady Deutscher, MD Inpatient   04/02/2019 0123 04/03/2019 1848 Full Code [id number]  Mansy, Arvella Merles, MD ED   03/30/2019 2349 04/01/2019 1809 Full Code [id number]  Mayer Camel, NP ED   03/06/2019 2256 03/07/2019 2039 Full Code [id number]  Mayer Camel, NP ED   02/14/2019 0456 02/15/2019 2030 Full Code [id number]  Harrie Foreman, MD Inpatient   02/02/2019 1729 02/04/2019 1804 Full Code [id number]  Hillary Bow, MD ED   01/03/2019 1944 01/04/2019 2003 Full Code [id number]  Gorden Harms, MD Inpatient   02/23/2018 0126 02/24/2018 1539 Full Code [id number]  Lance Coon, MD ED   04/22/2017 0004 04/22/2017 2213 Full Code [id number]  Lance Coon, MD Inpatient   04/17/2017 2234 04/19/2017 2005 Full Code  [id number]  [name], MD Inpatient   04/06/2016 1907 04/10/2016 1928 Full Code [id number]  Loletha Grayer, MD ED   Advance Care Planning Activity      TOTAL TIME TAKING CARE OF THIS PATIENT: 35 minutes.    Loletha Grayer M.D on 02/09/2020 at 5:01 PM  Between 7am to 6pm - Pager - [phone]  After 6pm go to [email or web address] - password [email or web address]  [facility]  CC: Primary care physician; [place]

## 2020-02-15 ENCOUNTER — Telehealth: Payer: Self-pay | Admitting: Pharmacist

## 2020-02-15 NOTE — Telephone Encounter (Signed)
Patient failed to provide requested 2021 financial documentation. No additional medication assistance will be provided by [facility] without the required proof of income documentation. Patient notified by letter Debra Cheek Administrative Assistant [facility] 

## 2020-02-28 ENCOUNTER — Emergency Department: Payer: Self-pay

## 2020-02-28 ENCOUNTER — Other Ambulatory Visit: Payer: Self-pay

## 2020-02-28 ENCOUNTER — Encounter: Payer: Self-pay | Admitting: *Deleted

## 2020-02-28 ENCOUNTER — Inpatient Hospital Stay
Admission: EM | Admit: 2020-02-28 | Discharge: 2020-03-02 | DRG: 638 | Disposition: A | Discharge status: Home / Self Care | Payer: Self-pay | Attending: Internal Medicine | Admitting: Internal Medicine

## 2020-02-28 DIAGNOSIS — E875 Hyperkalemia: Secondary | ICD-10-CM | POA: Diagnosis present

## 2020-02-28 DIAGNOSIS — E46 Unspecified protein-calorie malnutrition: Secondary | ICD-10-CM | POA: Diagnosis present

## 2020-02-28 DIAGNOSIS — E860 Dehydration: Secondary | ICD-10-CM | POA: Diagnosis present

## 2020-02-28 DIAGNOSIS — R64 Cachexia: Secondary | ICD-10-CM | POA: Diagnosis present

## 2020-02-28 DIAGNOSIS — K3184 Gastroparesis: Secondary | ICD-10-CM | POA: Diagnosis present

## 2020-02-28 DIAGNOSIS — Z833 Family history of diabetes mellitus: Secondary | ICD-10-CM

## 2020-02-28 DIAGNOSIS — R739 Hyperglycemia, unspecified: Secondary | ICD-10-CM | POA: Diagnosis present

## 2020-02-28 DIAGNOSIS — Z79899 Other long term (current) drug therapy: Secondary | ICD-10-CM

## 2020-02-28 DIAGNOSIS — N179 Acute kidney failure, unspecified: Secondary | ICD-10-CM | POA: Diagnosis present

## 2020-02-28 DIAGNOSIS — Z9119 Patient's noncompliance with other medical treatment and regimen: Secondary | ICD-10-CM

## 2020-02-28 DIAGNOSIS — Z9114 Patient's other noncompliance with medication regimen: Secondary | ICD-10-CM

## 2020-02-28 DIAGNOSIS — Z20822 Contact with and (suspected) exposure to covid-19: Secondary | ICD-10-CM | POA: Diagnosis present

## 2020-02-28 DIAGNOSIS — Z794 Long term (current) use of insulin: Secondary | ICD-10-CM

## 2020-02-28 DIAGNOSIS — R339 Retention of urine, unspecified: Secondary | ICD-10-CM | POA: Diagnosis present

## 2020-02-28 DIAGNOSIS — E1143 Type 2 diabetes mellitus with diabetic autonomic (poly)neuropathy: Secondary | ICD-10-CM | POA: Diagnosis present

## 2020-02-28 DIAGNOSIS — Z8616 Personal history of COVID-19: Secondary | ICD-10-CM

## 2020-02-28 DIAGNOSIS — W19XXXA Unspecified fall, initial encounter: Secondary | ICD-10-CM | POA: Diagnosis not present

## 2020-02-28 DIAGNOSIS — F17290 Nicotine dependence, other tobacco product, uncomplicated: Secondary | ICD-10-CM | POA: Diagnosis present

## 2020-02-28 DIAGNOSIS — T383X6A Underdosing of insulin and oral hypoglycemic [antidiabetic] drugs, initial encounter: Secondary | ICD-10-CM | POA: Diagnosis present

## 2020-02-28 DIAGNOSIS — E1110 Type 2 diabetes mellitus with ketoacidosis without coma: Principal | ICD-10-CM | POA: Diagnosis present

## 2020-02-28 LAB — HEPATIC FUNCTION PANEL
ALT: 12 U/L (ref 0–44)
AST: 10 U/L — ABNORMAL LOW (ref 15–41)
Albumin: 3.6 g/dL (ref 3.5–5.0)
Alkaline Phosphatase: 94 U/L (ref 38–126)
Bilirubin, Direct: 0.1 mg/dL (ref 0.0–0.2)
Indirect Bilirubin: 1 mg/dL — ABNORMAL HIGH (ref 0.3–0.9)
Total Bilirubin: 1.1 mg/dL (ref 0.3–1.2)
Total Protein: 8 g/dL (ref 6.5–8.1)

## 2020-02-28 LAB — URINALYSIS, COMPLETE (UACMP) WITH MICROSCOPIC
Bilirubin Urine: NEGATIVE
Glucose, UA: >=500 mg/dL — AB
Ketones, ur: 5 mg/dL — AB
Nitrite: NEGATIVE
Protein, ur: 30 mg/dL — AB
Specific Gravity, Urine: 1.011 (ref 1.005–1.030)
Squamous Epithelial / HPF: NONE SEEN (ref 0–5)
WBC, UA: >50 WBC/hpf — ABNORMAL HIGH (ref 0–5)
pH: 5 (ref 5.0–8.0)

## 2020-02-28 LAB — BLOOD GAS, VENOUS
Acid-base deficit: 13.3 mmol/L — ABNORMAL HIGH (ref 0.0–2.0)
Bicarbonate: 13 mmol/L — ABNORMAL LOW (ref 20.0–28.0)
O2 Saturation: 81.3 %
Patient temperature: 37
pCO2, Ven: 31 mmHg — ABNORMAL LOW (ref 44.0–60.0)
pH, Ven: 7.23 — ABNORMAL LOW (ref 7.250–7.430)
pO2, Ven: 55 mmHg — ABNORMAL HIGH (ref 32.0–45.0)

## 2020-02-28 LAB — BASIC METABOLIC PANEL
Anion gap: 19 — ABNORMAL HIGH (ref 5–15)
Anion gap: 10 (ref 5–15)
BUN: 79 mg/dL — ABNORMAL HIGH (ref 6–20)
BUN: 64 mg/dL — ABNORMAL HIGH (ref 6–20)
CO2: 17 mmol/L — ABNORMAL LOW (ref 22–32)
CO2: 19 mmol/L — ABNORMAL LOW (ref 22–32)
Calcium: 9.5 mg/dL (ref 8.9–10.3)
Calcium: 8.6 mg/dL — ABNORMAL LOW (ref 8.9–10.3)
Chloride: 95 mmol/L — ABNORMAL LOW (ref 98–111)
Chloride: 101 mmol/L (ref 98–111)
Creatinine, Ser: 3.38 mg/dL — ABNORMAL HIGH (ref 0.61–1.24)
Creatinine, Ser: 2.35 mg/dL — ABNORMAL HIGH (ref 0.61–1.24)
GFR calc Af Amer: 24 mL/min — ABNORMAL LOW (ref >60)
GFR calc Af Amer: 38 mL/min — ABNORMAL LOW (ref >60)
GFR calc non Af Amer: 21 mL/min — ABNORMAL LOW (ref >60)
GFR calc non Af Amer: 32 mL/min — ABNORMAL LOW (ref >60)
Glucose, Bld: 547 mg/dL (ref 70–99)
Glucose, Bld: 280 mg/dL — ABNORMAL HIGH (ref 70–99)
Potassium: 5.5 mmol/L — ABNORMAL HIGH (ref 3.5–5.1)
Potassium: 4.2 mmol/L (ref 3.5–5.1)
Sodium: 131 mmol/L — ABNORMAL LOW (ref 135–145)
Sodium: 130 mmol/L — ABNORMAL LOW (ref 135–145)

## 2020-02-28 LAB — BETA-HYDROXYBUTYRIC ACID: Beta-Hydroxybutyric Acid: 4.25 mmol/L — ABNORMAL HIGH (ref 0.05–0.27)

## 2020-02-28 LAB — GLUCOSE, CAPILLARY
Glucose-Capillary: 500 mg/dL — ABNORMAL HIGH (ref 70–99)
Glucose-Capillary: 472 mg/dL — ABNORMAL HIGH (ref 70–99)
Glucose-Capillary: 426 mg/dL — ABNORMAL HIGH (ref 70–99)
Glucose-Capillary: 388 mg/dL — ABNORMAL HIGH (ref 70–99)
Glucose-Capillary: 333 mg/dL — ABNORMAL HIGH (ref 70–99)
Glucose-Capillary: 284 mg/dL — ABNORMAL HIGH (ref 70–99)
Glucose-Capillary: 247 mg/dL — ABNORMAL HIGH (ref 70–99)
Glucose-Capillary: 225 mg/dL — ABNORMAL HIGH (ref 70–99)

## 2020-02-28 LAB — CBC
HCT: 33.9 % — ABNORMAL LOW (ref 39.0–52.0)
Hemoglobin: 11 g/dL — ABNORMAL LOW (ref 13.0–17.0)
MCH: 31.2 pg (ref 26.0–34.0)
MCHC: 32.4 g/dL (ref 30.0–36.0)
MCV: 96 fL (ref 80.0–100.0)
Platelets: 354 10*3/uL (ref 150–400)
RBC: 3.53 MIL/uL — ABNORMAL LOW (ref 4.22–5.81)
RDW: 13.7 % (ref 11.5–15.5)
WBC: 10.8 10*3/uL — ABNORMAL HIGH (ref 4.0–10.5)
nRBC: 0 % (ref 0.0–0.2)

## 2020-02-28 LAB — SARS CORONAVIRUS 2 BY RT PCR (HOSPITAL ORDER, PERFORMED IN ~~LOC~~ HOSPITAL LAB): SARS Coronavirus 2: NEGATIVE

## 2020-02-28 IMAGING — DX DG CHEST 1V PORT
1 series · 1 of 1 positions shown · non-contrast
Comparison: Most recent radiograph [DATE]. Most recent CT
[DATE], chest CT [DATE] also reviewed

CLINICAL DATA: Weakness. Body aches. Patient has history of COVID
and tuberculosis infection.

EXAM:
PORTABLE CHEST 1 VIEW

[chest ap]
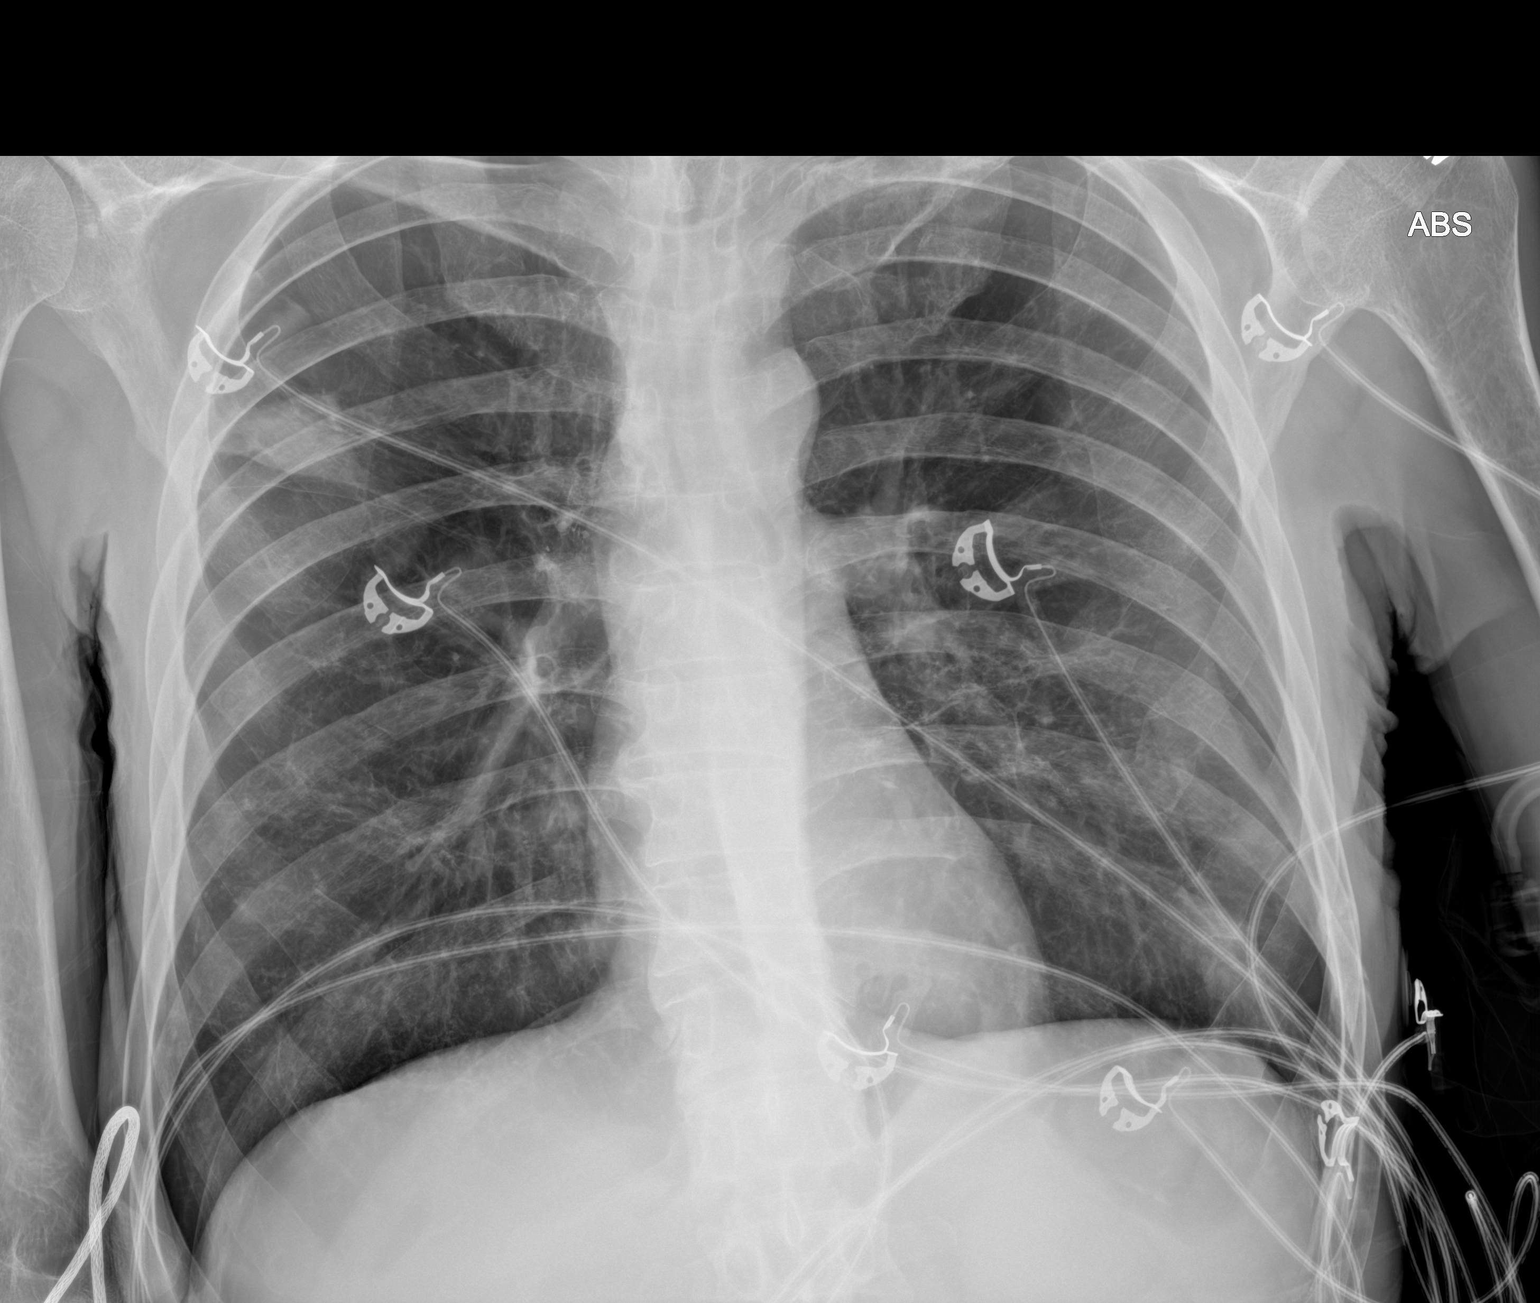

[1 of 1 positions shown; findings below may reference images not displayed]

FINDINGS: Chronic airspace opacity in the perifissural right upper lobe is
likely post infectious scarring, and has been present over multiple
prior exams. The previous nodular opacities in the left lung have
improved, with mild streaky residual at the left lung base,
partially obscured by overlying EKG leads. No evidence of new or
acute airspace disease. Normal heart size and mediastinal contours.
No pulmonary edema, pneumothorax, or large pleural effusion.
Multiple overlying monitoring devices in place.
IMPRESSION: 1. No acute abnormality.
2. Chronic airspace opacity in the perifissural right upper lobe is
likely related to scarring. This has been present over multiple
prior exams.
3. Previous nodular opacities in the left lung have improved, with
mild streaky residual at the left lung base.

## 2020-02-28 MED ORDER — LACTATED RINGERS IV BOLUS
1000.0000 mL | Freq: Once | INTRAVENOUS | Status: AC
  Administered 2020-02-28: 1000 mL via INTRAVENOUS

## 2020-02-28 MED ORDER — ACETAMINOPHEN 325 MG PO TABS: 650.0000 mg | ORAL_TABLET | Freq: Four times a day (QID) | ORAL | Status: DC | PRN

## 2020-02-28 MED ORDER — ONDANSETRON HCL 4 MG PO TABS: 4.0000 mg | ORAL_TABLET | Freq: Four times a day (QID) | ORAL | Status: DC | PRN

## 2020-02-28 MED ORDER — ONDANSETRON HCL 4 MG/2ML IJ SOLN: 4.0000 mg | Freq: Four times a day (QID) | INTRAMUSCULAR | Status: DC | PRN

## 2020-02-28 MED ORDER — DEXTROSE 50 % IV SOLN: 0.0000 mL | INTRAVENOUS | Status: DC | PRN

## 2020-02-28 MED ORDER — LOPERAMIDE HCL 2 MG PO CAPS
2.0000 mg | ORAL_CAPSULE | Freq: Two times a day (BID) | ORAL | Status: DC | PRN
  Administered 2020-03-01: 2 mg via ORAL
  Filled 2020-02-28: qty 1

## 2020-02-28 MED ORDER — ENOXAPARIN SODIUM 30 MG/0.3ML ~~LOC~~ SOLN
30.0000 mg | SUBCUTANEOUS | Status: DC
  Administered 2020-02-28 – 2020-03-01 (×3): 30 mg via SUBCUTANEOUS
  Filled 2020-02-28 (×3): qty 0.3

## 2020-02-28 MED ORDER — DEXTROSE-NACL 5-0.45 % IV SOLN: INTRAVENOUS | Status: DC

## 2020-02-28 MED ORDER — SODIUM CHLORIDE 0.9% FLUSH
3.0000 mL | Freq: Two times a day (BID) | INTRAVENOUS | Status: DC
  Administered 2020-02-29 – 2020-03-01 (×3): 3 mL via INTRAVENOUS

## 2020-02-28 MED ORDER — SODIUM CHLORIDE 0.9 % IV SOLN: INTRAVENOUS | Status: DC

## 2020-02-28 MED ORDER — TAMSULOSIN HCL 0.4 MG PO CAPS
0.4000 mg | ORAL_CAPSULE | Freq: Every day | ORAL | Status: DC
  Administered 2020-02-29 – 2020-03-02 (×3): 0.4 mg via ORAL
  Filled 2020-02-28 (×3): qty 1

## 2020-02-28 MED ORDER — ACETAMINOPHEN 650 MG RE SUPP: 650.0000 mg | Freq: Four times a day (QID) | RECTAL | Status: DC | PRN

## 2020-02-28 MED ORDER — INSULIN REGULAR(HUMAN) IN NACL 100-0.9 UT/100ML-% IV SOLN
INTRAVENOUS | Status: DC
  Administered 2020-02-28: 7.5 [IU]/h via INTRAVENOUS
  Filled 2020-02-28: qty 100

## 2020-02-28 NOTE — ED Notes (Signed)
Bladder scanned 495ml

## 2020-02-28 NOTE — ED Triage Notes (Signed)
Pt is here for generalized weakness and lack of appetite x a couple of days.  Pt is hypoglycemic and vomiting in triage.

## 2020-02-28 NOTE — ED Notes (Signed)
Upon initial assessment, pt appears weak, answers questions appropriately, appears severely undernourished. Admits to not eating for over a week, hasn't taken insulin in a week as well. States vomit x1 today. Pt c/o body aches. MD at bedside.

## 2020-02-28 NOTE — ED Provider Notes (Signed)
[facility] Emergency Department Provider Note   ____________________________________________   First MD Initiated Contact with Patient 02/28/20 1517     (approximate)  I have reviewed the triage vital signs and the nursing notes.   HISTORY  Chief Complaint Hyperglycemia    HPI Gregory Moody is a 44 y.o. male with possible history of diabetes, gastroparesis, and malnutrition who presents to the ED for hyperglycemia.  Patient reports that he has been feeling increasingly weak over the past couple of days with very poor appetite.  He vomited a couple times earlier today, admits he has not had much to eat for about a week.  He also states that he has not been taking his insulin for about a week, states "I feel good without it".  He states that insulin is available at home but that he just does not use it.  He denies any fevers, cough, chest pain, shortness of breath, abdominal pain, dysuria, or hematuria.  He does complain of diffuse body aches.        Past Medical History:  Diagnosis Date  . COVID-19   . Diabetes mellitus without complication ([place])   . Gastroparesis   . Tuberculosis     Patient Active Problem List   Diagnosis Date Noted  . Iron deficiency anemia   . Weight loss   . Chronic diarrhea 02/07/2020  . Abnormal CT of the chest   . Acute urinary retention 02/06/2020  . Hyperglycemia due to type 2 diabetes mellitus ([place]) 02/06/2020  . History of Clostridium difficile colitis 02/06/2020  . Generalized weakness 02/06/2020  . History of COVID-19 02/06/2020  . Diarrhea 01/21/2020  . Tobacco abuse 01/21/2020  . C. difficile colitis 01/21/2020  . Urinary retention 01/21/2020  . Diabetes mellitus without complication ([place]) 93/81/8299  . Protein-calorie malnutrition, severe 01/02/2020  . Odynophagia   . DKA (diabetic ketoacidoses) ([place]) 12/27/2019  . Depression 12/27/2019  . DKA, type 2 ([place]) 12/27/2019  . Anemia of chronic disease  04/25/2019  . Hypomagnesemia 04/25/2019  . Hypotension 04/23/2019  . CAP (community acquired pneumonia) due to MSSA (methicillin sensitive Staphylococcus aureus) ([place]) 04/14/2019  . COVID-19 virus infection 04/14/2019  . Pressure injury of skin 03/31/2019  . Acute renal failure (ARF) ([place]) 03/06/2019  . CAP (community acquired pneumonia) 02/14/2019  . AKI (acute kidney injury) ([place]) 02/02/2019  . ARF (acute renal failure) ([place]) 01/03/2019  . Malnutrition of moderate degree 02/24/2018  . GERD (gastroesophageal reflux disease) 02/23/2018  . Diabetic gastroparesis ([place]) 02/23/2018  . Diabetic foot ulcer ([place]) 02/23/2018  . Aspiration pneumonia ([place]) 04/21/2017  . Hypoglycemia 04/21/2017  . Diabetes mellitus with hyperglycemia ([place]) 04/21/2017  . Hip fracture, unspecified laterality, closed, initial encounter ([place]) 04/17/2017  . Hyperglycemia 04/17/2017  . Malnutrition ([place]) 04/09/2016  . Cavitary lesion of lung 04/06/2016    Past Surgical History:  Procedure Laterality Date  . COLONOSCOPY N/A 01/25/2020   Procedure: COLONOSCOPY;  Surgeon: Toledo, Benay Pike, MD;  Location: [facility] ENDOSCOPY;  Service: Gastroenterology;  Laterality: N/A;  . ESOPHAGOGASTRODUODENOSCOPY N/A 02/03/2019   Procedure: ESOPHAGOGASTRODUODENOSCOPY (EGD);  Surgeon: Toledo, Benay Pike, MD;  Location: [facility] ENDOSCOPY;  Service: Gastroenterology;  Laterality: N/A;    Prior to Admission medications   Medication Sig Start Date End Date Taking? Authorizing Provider  colesevelam [facility]) 625 MG tablet Take 3 tablets (1,875 mg total) by mouth 2 (two) times daily with a meal. 02/09/20   Loletha Grayer, MD  Ensure Max Protein (ENSURE MAX PROTEIN) LIQD Take 330 mLs (  11 oz total) by mouth 2 (two) times daily. 02/09/20   Loletha Grayer, MD  FLUoxetine (PROZAC) 10 MG capsule Take 1 capsule (10 mg total) by mouth daily. 02/09/20   Loletha Grayer, MD  insulin NPH-regular Human (70-30) 100 UNIT/ML injection Inject 30 Units into the  skin 2 (two) times daily with a meal. 02/10/20   Loletha Grayer, MD  liver oil-zinc oxide (DESITIN) 40 % ointment Apply topically 2 (two) times daily as needed for irritation. 02/09/20   Loletha Grayer, MD  loperamide (IMODIUM) 2 MG capsule Take 1 capsule (2 mg total) by mouth 2 (two) times daily as needed for diarrhea or loose stools. 02/09/20   Loletha Grayer, MD  Multiple Vitamin (MULTIVITAMIN WITH MINERALS) TABS tablet Take 1 tablet by mouth daily. 02/09/20   Loletha Grayer, MD  tamsulosin (FLOMAX) 0.4 MG CAPS capsule Take 1 capsule (0.4 mg total) by mouth daily after breakfast. 02/10/20   Loletha Grayer, MD    Allergies Patient has no known allergies.  Family History  Problem Relation Age of Onset  . Diabetes Mother   . Diabetes Father     Social History Social History   Tobacco Use  . Smoking status: Current Some Day Smoker    Types: Cigars  . Smokeless tobacco: Never Used  Substance Use Topics  . Alcohol use: No  . Drug use: No    Review of Systems  Constitutional: No fever/chills.  Positive for generalized weakness. Eyes: No visual changes. ENT: No sore throat. Cardiovascular: Denies chest pain. Respiratory: Denies shortness of breath. Gastrointestinal: No abdominal pain.  Positive for nausea and vomiting.  No diarrhea.  No constipation. Genitourinary: Negative for dysuria. Musculoskeletal: Negative for back pain. Skin: Negative for rash. Neurological: Negative for headaches, focal weakness or numbness.  ____________________________________________   PHYSICAL EXAM:  VITAL SIGNS: ED Triage Vitals  Enc Vitals Group     BP 02/28/20 1511 (!) 67/49     Pulse --      Resp 02/28/20 1511 16     Temp 02/28/20 1511 97.8 F (36.6 C)     Temp Source 02/28/20 1511 Oral     SpO2 02/28/20 1511 98 %     Weight --      Height --      Head Circumference --      Peak Flow --      Pain Score 02/28/20 1512 0     Pain Loc --      Pain Edu? --      Excl. in [place]? --      Constitutional: Alert and oriented.  Very thin and cachectic appearing. Eyes: Conjunctivae are normal. Head: Atraumatic. Nose: No congestion/rhinnorhea. Mouth/Throat: Mucous membranes are dry. Neck: Normal ROM Cardiovascular: Normal rate, regular rhythm. Grossly normal heart sounds. Respiratory: Normal respiratory effort.  No retractions. Lungs CTAB. Gastrointestinal: Soft and nontender. No distention. Genitourinary: deferred Musculoskeletal: No lower extremity tenderness nor edema. Neurologic:  Normal speech and language. No gross focal neurologic deficits are appreciated. Skin:  Skin is warm, dry and intact. No rash noted. Psychiatric: Mood and affect are normal. Speech and behavior are normal.  ____________________________________________   LABS (all labs ordered are listed, but only abnormal results are displayed)  Labs Reviewed  GLUCOSE, CAPILLARY - Abnormal; Notable for the following components:      Result Value   Glucose-Capillary 500 (*)    All other components within normal limits  BASIC METABOLIC PANEL - Abnormal; Notable for the following components:   Sodium  131 (*)    Potassium 5.5 (*)    Chloride 95 (*)    CO2 17 (*)    Glucose, Bld 547 (*)    BUN 79 (*)    Creatinine, Ser 3.38 (*)    GFR calc non Af Amer 21 (*)    GFR calc Af Amer 24 (*)    Anion gap 19 (*)    All other components within normal limits  CBC - Abnormal; Notable for the following components:   WBC 10.8 (*)    RBC 3.53 (*)    Hemoglobin 11.0 (*)    HCT 33.9 (*)    All other components within normal limits  BETA-HYDROXYBUTYRIC ACID - Abnormal; Notable for the following components:   Beta-Hydroxybutyric Acid 4.25 (*)    All other components within normal limits  HEPATIC FUNCTION PANEL - Abnormal; Notable for the following components:   AST 10 (*)    Indirect Bilirubin 1.0 (*)    All other components within normal limits  SARS CORONAVIRUS 2 BY RT PCR (HOSPITAL ORDER, [place] [facility])  URINALYSIS, COMPLETE (UACMP) WITH MICROSCOPIC  BLOOD GAS, VENOUS  CBG MONITORING, ED  CBG MONITORING, ED  CBG MONITORING, ED   ____________________________________________  EKG  ED ECG REPORT I, Blake Divine, the attending physician, personally viewed and interpreted this ECG.   Date: 02/28/2020  EKG Time: 16:40  Rate: 82  Rhythm: normal sinus rhythm  Axis: Normal  Intervals:none  ST&T Change: None   PROCEDURES  Procedure(s) performed (including Critical Care):  .Critical Care Performed by: Blake Divine, MD Authorized by: Blake Divine, MD   Critical care provider statement:    Critical care time (minutes):  45   Critical care time was exclusive of:  Separately billable procedures and treating other patients and teaching time   Critical care was necessary to treat or prevent imminent or life-threatening deterioration of the following conditions:  Endocrine crisis   Critical care was time spent personally by me on the following activities:  Discussions with consultants, evaluation of patient's response to treatment, examination of patient, ordering and performing treatments and interventions, ordering and review of laboratory studies, ordering and review of radiographic studies, pulse oximetry, re-evaluation of patient's condition, obtaining history from patient or surrogate and review of old charts   I assumed direction of critical care for this patient from another provider in my specialty: no   .1-3 Lead EKG Interpretation Performed by: Blake Divine, MD Authorized by: Blake Divine, MD     Interpretation: normal     ECG rate:  81   ECG rate assessment: normal     Rhythm: sinus rhythm     Ectopy: none     Conduction: normal       ____________________________________________   INITIAL IMPRESSION / ASSESSMENT AND PLAN / ED COURSE       44 year old male with history of poorly controlled type 2 diabetes and DKA presents to the  ED for generalized weakness with poor p.o. intake for the past week, also states that he has not been taking his insulin for about the past week.  He appears very thin and cachectic as well as dehydrated.  Initial blood pressure was low but repeat improved.  He was noted be hyperglycemic and we will start hydration with IV fluids.  Lab work is consistent with DKA given elevated anion gap and bicarb of 17.  He also has an AKI with mildly elevated potassium at 5.5.  There are  no apparent EKG changes and we will hold off on calcium administration.  Plan to start patient on insulin drip, which should help improve his potassium along with IV fluid administration.  There is no obvious infectious process and I suspect his DKA is due to medication noncompliance.  Plan to discuss with hospitalist for admission.  Bladder scan performed and shows 420 cc of urine, patient reports he is currently unable to urinate.  We will place Foley catheter given concern for urinary retention contributing to his AKI.  Case was discussed with hospitalist for admission.     ____________________________________________   FINAL CLINICAL IMPRESSION(S) / ED DIAGNOSES  Final diagnoses:  Diabetic ketoacidosis without coma associated with type 2 diabetes mellitus (HCC)  AKI (acute kidney injury) ([place])  Hyperkalemia     ED Discharge Orders    None       Note:  This document was prepared using Dragon voice recognition software and may include unintentional dictation errors.   Blake Divine, MD 02/28/20 [id number]

## 2020-02-28 NOTE — ED Notes (Signed)
Patient has asked multiple times for food, educated on importance of staying NPO while on insulin

## 2020-02-28 NOTE — ED Notes (Signed)
Pt resting in bed, snack given per md order and pt request. Pt in no distress, denies pain, call light in reach.

## 2020-02-28 NOTE — H&P (Addendum)
History and Physical    Oddis Westling OHY:[id number] DOB: 04-28-1976 DOA: 02/28/2020  PCP: Pike   Patient coming from: Home  I have personally briefly reviewed patient's old medical records in [employer]  Chief Complaint: Generalized weakness, lack of appetite and vomiting.  HPI: Gregory Moody is a 44 y.o. 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 difficile colitis, undergoing colonoscopy with negative biopsies, with hospitalization being complicated by acute urinary retention requiring temporary Foley placement, followed by another admission from 02/06/2020 to 02/09/2020 with similar complaints and urinary retention requiring Foley catheter.  Per patient he did not eat anything for the past 5 days, he could not explain why. He only become nauseated today, no vomiting. He was not using his insulin, stating that he was trying to control his diabetes with diet although not eating either. He did not took any of his meds since prior to discharge 2 week ago, stating that he cannot afford his medications. At the same time he told me that he does has insulin at home but not using it. He occasionally checks his blood glucose levels at home and states that whenever he checks they were elevated. Patient lives with his parents and son. He continued to have diarrhea, unable to explain the frequency. He denies any urinary symptoms, stating that he is urinating well. Unable to provide sample in ED. he denies any fever or chills. Denies any chest pain or shortness of breath.  ED Course: On presentation he is afebrile, hypertensive at 67/49 which improved to 122/90 with IV fluid.  Labs positive for  leukocytosis of 10.8, hemoglobin of 11, blood glucose of 547, potassium of 5.5, bicarb of 17 and a gap of 19 along with AKI with BUN of 79 and creatinine of 3.38 with baseline around 1.1.  Beta hydroxybutyric acid elevated at 4.25.  UA and COVID-19 testing pending. [facility] hospital[facility] service was called for a DKA admission.  Review of Systems: As per HPI otherwise 10 point review of systems negative.   Past Medical History:  Diagnosis Date  . COVID-19   . Diabetes mellitus without complication ([place])   . Gastroparesis   . Tuberculosis     Past Surgical History:  Procedure Laterality Date  . COLONOSCOPY N/A 01/25/2020   Procedure: COLONOSCOPY;  Surgeon: Toledo, Benay Pike, MD;  Location: [facility] ENDOSCOPY;  Service: Gastroenterology;  Laterality: N/A;  . ESOPHAGOGASTRODUODENOSCOPY N/A 02/03/2019   Procedure: ESOPHAGOGASTRODUODENOSCOPY (EGD);  Surgeon: Toledo, Benay Pike, MD;  Location: [facility] ENDOSCOPY;  Service: Gastroenterology;  Laterality: N/A;     reports that he has been smoking cigars. He has never used smokeless tobacco. He reports that he does not drink alcohol or use drugs.  No Known Allergies  Family History  Problem Relation Age of Onset  . Diabetes Mother   . Diabetes Father     Prior to Admission medications   Medication Sig Start Date End Date Taking? Authorizing Provider  colesevelam (WELCHOL) 625 MG tablet Take 3 tablets (1,875 mg total) by mouth 2 (two) times daily with a meal. 02/09/20   Loletha Grayer, MD  Ensure Max Protein (ENSURE MAX PROTEIN) LIQD Take 330 mLs (11 oz total) by mouth 2 (two) times daily. 02/09/20   Loletha Grayer, MD  FLUoxetine (PROZAC) 10 MG capsule  Take 1 capsule (10 mg total) by mouth daily. 02/09/20   Loletha Grayer, MD  insulin NPH-regular Human (70-30) 100 UNIT/ML injection Inject 30 Units into the skin 2 (two) times daily with a meal. 02/10/20   Loletha Grayer, MD  liver oil-zinc oxide (DESITIN) 40 % ointment Apply topically 2 (two) times daily  as needed for irritation. 02/09/20   Loletha Grayer, MD  loperamide (IMODIUM) 2 MG capsule Take 1 capsule (2 mg total) by mouth 2 (two) times daily as needed for diarrhea or loose stools. 02/09/20   Loletha Grayer, MD  Multiple Vitamin (MULTIVITAMIN WITH MINERALS) TABS tablet Take 1 tablet by mouth daily. 02/09/20   Loletha Grayer, MD  tamsulosin (FLOMAX) 0.4 MG CAPS capsule Take 1 capsule (0.4 mg total) by mouth daily after breakfast. 02/10/20   Loletha Grayer, MD    Physical Exam: Vitals:   02/28/20 1618 02/28/20 1624 02/28/20 1630 02/28/20 1636  BP:      Pulse: 80 80 82 81  Resp: 14 14 17 12   Temp:      TempSrc:      SpO2: 100% 100% 100% 100%    General: Vital signs reviewed.  Patient is emaciated, in no acute distress and cooperative with exam.  Head: Normocephalic and atraumatic. Eyes: EOMI, conjunctivae normal, no scleral icterus.  ENMT: Mucous membranes are dry. Neck: Supple, trachea midline, normal ROM, no JVD, masses, thyromegaly, or carotid bruit present.  Cardiovascular: RRR, S1 normal, S2 normal, no murmurs, gallops, or rubs. Pulmonary/Chest: Clear to auscultation bilaterally, no wheezes, rales, or rhonchi. Abdominal: Soft, non-tender, non-distended, BS +,  Extremities: No lower extremity edema bilaterally,  pulses symmetric and intact bilaterally. No cyanosis or clubbing. Neurological: A&O x3, Strength is normal and symmetric bilaterally, cranial nerve II-XII are grossly intact, no focal motor deficit, sensory intact to light touch bilaterally.  Psychiatric: Normal mood and affect.  Labs on Admission: I have personally reviewed following labs and imaging studies  CBC: Recent Labs  Lab 02/28/20 1507  WBC 10.8*  HGB 11.0*  HCT 33.9*  MCV 96.0  PLT 268   Basic Metabolic Panel: Recent Labs  Lab 02/28/20 1507  NA 131*  K 5.5*  CL 95*  CO2 17*  GLUCOSE 547*  BUN 79*  CREATININE 3.38*  CALCIUM 9.5   GFR: CrCl cannot be calculated (Unknown ideal weight.).  Liver Function Tests: Recent Labs  Lab 02/28/20 1507  AST 10*  ALT 12  ALKPHOS 94  BILITOT 1.1  PROT 8.0  ALBUMIN 3.6   No results for input(s): LIPASE, AMYLASE in the last 168 hours. No results for input(s): AMMONIA in the last 168 hours. Coagulation Profile: No results for input(s): INR, PROTIME in the last 168 hours. Cardiac Enzymes: No results for input(s): CKTOTAL, CKMB, CKMBINDEX, TROPONINI in the last 168 hours. BNP (last 3 results) No results for input(s): PROBNP in the last 8760 hours. HbA1C: No results for input(s): HGBA1C in the last 72 hours. CBG: Recent Labs  Lab 02/28/20 1512 02/28/20 1725  GLUCAP 500* 472*   Lipid Profile: No results for input(s): CHOL, HDL, LDLCALC, TRIG, CHOLHDL, LDLDIRECT in the last 72 hours. Thyroid Function Tests: No results for input(s): TSH, T4TOTAL, FREET4, T3FREE, THYROIDAB in the last 72 hours. Anemia Panel: No results for input(s): VITAMINB12, FOLATE, FERRITIN, TIBC, IRON, RETICCTPCT in the last 72 hours. Urine analysis:    Component Value Date/Time   COLORURINE STRAW (A) 02/06/2020 1711   APPEARANCEUR CLEAR (A) 02/06/2020 1711   LABSPEC 1.020 02/06/2020 1711  PHURINE 5.0 02/06/2020 1711   GLUCOSEU >=500 (A) 02/06/2020 1711   HGBUR NEGATIVE 02/06/2020 1711   BILIRUBINUR NEGATIVE 02/06/2020 1711   KETONESUR NEGATIVE 02/06/2020 1711   PROTEINUR NEGATIVE 02/06/2020 1711   NITRITE NEGATIVE 02/06/2020 1711   LEUKOCYTESUR NEGATIVE 02/06/2020 1711    Radiological Exams on Admission: DG Chest Portable 1 View  Result Date: 02/28/2020 CLINICAL DATA:  Weakness. Body aches. Patient has history of COVID and tuberculosis infection. EXAM: PORTABLE CHEST 1 VIEW COMPARISON:  Most recent radiograph 02/07/2020. Most recent CT 12/29/2019, chest CT 04/06/2016 also reviewed FINDINGS: Chronic airspace opacity in the perifissural right upper lobe is likely post infectious scarring, and has been present over multiple prior exams. The previous  nodular opacities in the left lung have improved, with mild streaky residual at the left lung base, partially obscured by overlying EKG leads. No evidence of new or acute airspace disease. Normal heart size and mediastinal contours. No pulmonary edema, pneumothorax, or large pleural effusion. Multiple overlying monitoring devices in place. IMPRESSION: 1. No acute abnormality. 2. Chronic airspace opacity in the perifissural right upper lobe is likely related to scarring. This has been present over multiple prior exams. 3. Previous nodular opacities in the left lung have improved, with mild streaky residual at the left lung base. Electronically Signed   By: Keith Rake M.D.   On: 02/28/2020 15:47    Assessment/Plan Active Problems:   DKA (diabetic ketoacidoses) ([place])   Insulin-dependent diabetes with DKA.  Uncontrolled diabetes with hyperglycemia, A1c of 10.8 in April 2021.  Multiple prior admissions with similar symptoms in DKA.  Apparently does not take insulin at home. -Started on insulin infusion per DKA protocol. -BMP every 4 hourly.  AKI.  Patient has an history of prior AKI due to urinary retention, requiring Foley catheter in the past.  During prior admission he was started on Flomax which he never took. -We will check bladder scan-more than 500 cc.  Suspecting some element of neurogenic bladder due to uncontrolled diabetes. -Place Foley-will need a outpatient urology consult and might need discharged with Foley. -Monitor renal function.  Generalized weakness   Diabetic gastroparesis (HCC)   Malnutrition, protein calorie, suspect severe ([place]). Patient is unable to take care of himself at home.  Per patient he did not eat anything for the past week. -Nutritionist consult. -PT evaluation-might need placement as patient with multiple admissions due to similar reasons and unable to take care of himself at home. -He might get benefit with a psych consult to see if there is any underlying  reason why he is not taking care of himself.  Hyperkalemia.  Potassium at 5.5.  Patient with DKA. -Suspecting improvement in potassium with insulin. -Continue to monitor.  History of COVID-29 April 2019 -No acute concerns.  DVT prophylaxis: Lovenox Code Status: Full code Family Communication:  Disposition Plan: Patient might need  placement as unable to take care of himself. Consults called: None Admission status: Inpatient.   Lorella Nimrod MD [facility]  If 7PM-7AM, please contact night-coverage [email or web address]  02/28/2020, 5:40 PM   This record has been created using Systems analyst. Errors have been sought and corrected,but may not always be located. Such creation errors do not reflect on the standard of care.

## 2020-02-29 DIAGNOSIS — R739 Hyperglycemia, unspecified: Secondary | ICD-10-CM

## 2020-02-29 LAB — GLUCOSE, CAPILLARY
Glucose-Capillary: 117 mg/dL — ABNORMAL HIGH (ref 70–99)
Glucose-Capillary: 134 mg/dL — ABNORMAL HIGH (ref 70–99)
Glucose-Capillary: 141 mg/dL — ABNORMAL HIGH (ref 70–99)
Glucose-Capillary: 156 mg/dL — ABNORMAL HIGH (ref 70–99)
Glucose-Capillary: 167 mg/dL — ABNORMAL HIGH (ref 70–99)
Glucose-Capillary: 168 mg/dL — ABNORMAL HIGH (ref 70–99)
Glucose-Capillary: 199 mg/dL — ABNORMAL HIGH (ref 70–99)
Glucose-Capillary: 235 mg/dL — ABNORMAL HIGH (ref 70–99)
Glucose-Capillary: 275 mg/dL — ABNORMAL HIGH (ref 70–99)
Glucose-Capillary: 91 mg/dL (ref 70–99)

## 2020-02-29 LAB — BASIC METABOLIC PANEL
Anion gap: 8 (ref 5–15)
Anion gap: 8 (ref 5–15)
BUN: 63 mg/dL — ABNORMAL HIGH (ref 6–20)
BUN: 57 mg/dL — ABNORMAL HIGH (ref 6–20)
CO2: 21 mmol/L — ABNORMAL LOW (ref 22–32)
CO2: 21 mmol/L — ABNORMAL LOW (ref 22–32)
Calcium: 8.6 mg/dL — ABNORMAL LOW (ref 8.9–10.3)
Calcium: 8.6 mg/dL — ABNORMAL LOW (ref 8.9–10.3)
Chloride: 101 mmol/L (ref 98–111)
Chloride: 103 mmol/L (ref 98–111)
Creatinine, Ser: 2.22 mg/dL — ABNORMAL HIGH (ref 0.61–1.24)
Creatinine, Ser: 1.64 mg/dL — ABNORMAL HIGH (ref 0.61–1.24)
GFR calc Af Amer: 40 mL/min — ABNORMAL LOW (ref >60)
GFR calc Af Amer: 58 mL/min — ABNORMAL LOW (ref >60)
GFR calc non Af Amer: 35 mL/min — ABNORMAL LOW (ref >60)
GFR calc non Af Amer: 50 mL/min — ABNORMAL LOW (ref >60)
Glucose, Bld: 218 mg/dL — ABNORMAL HIGH (ref 70–99)
Glucose, Bld: 140 mg/dL — ABNORMAL HIGH (ref 70–99)
Potassium: 3.9 mmol/L (ref 3.5–5.1)
Potassium: 3.8 mmol/L (ref 3.5–5.1)
Sodium: 130 mmol/L — ABNORMAL LOW (ref 135–145)
Sodium: 132 mmol/L — ABNORMAL LOW (ref 135–145)

## 2020-02-29 LAB — CBC
HCT: 25 % — ABNORMAL LOW (ref 39.0–52.0)
Hemoglobin: 8.5 g/dL — ABNORMAL LOW (ref 13.0–17.0)
MCH: 31.6 pg (ref 26.0–34.0)
MCHC: 34 g/dL (ref 30.0–36.0)
MCV: 92.9 fL (ref 80.0–100.0)
Platelets: 269 10*3/uL (ref 150–400)
RBC: 2.69 MIL/uL — ABNORMAL LOW (ref 4.22–5.81)
RDW: 13.2 % (ref 11.5–15.5)
WBC: 8.1 10*3/uL (ref 4.0–10.5)
nRBC: 0 % (ref 0.0–0.2)

## 2020-02-29 MED ORDER — ENSURE MAX PROTEIN PO LIQD
11.0000 [oz_av] | Freq: Two times a day (BID) | ORAL | Status: DC
  Administered 2020-03-01: 11 [oz_av] via ORAL
  Filled 2020-02-29: qty 330

## 2020-02-29 MED ORDER — LACTATED RINGERS IV BOLUS
1000.0000 mL | Freq: Once | INTRAVENOUS | Status: AC
  Administered 2020-02-29: 1000 mL via INTRAVENOUS

## 2020-02-29 MED ORDER — INSULIN ASPART PROT & ASPART (70-30 MIX) 100 UNIT/ML ~~LOC~~ SUSP
30.0000 [IU] | Freq: Two times a day (BID) | SUBCUTANEOUS | Status: DC
  Administered 2020-02-29 – 2020-03-02 (×6): 30 [IU] via SUBCUTANEOUS
  Filled 2020-02-29 (×7): qty 10

## 2020-02-29 MED ORDER — INSULIN ASPART 100 UNIT/ML ~~LOC~~ SOLN
0.0000 [IU] | Freq: Three times a day (TID) | SUBCUTANEOUS | Status: DC
  Administered 2020-02-29 – 2020-03-01 (×2): 2 [IU] via SUBCUTANEOUS
  Administered 2020-03-01: 1 [IU] via SUBCUTANEOUS
  Administered 2020-03-01 – 2020-03-02 (×2): 2 [IU] via SUBCUTANEOUS
  Filled 2020-02-29 (×5): qty 1

## 2020-02-29 MED ORDER — INSULIN ASPART 100 UNIT/ML ~~LOC~~ SOLN
0.0000 [IU] | SUBCUTANEOUS | Status: DC
  Administered 2020-02-29: 11 [IU] via SUBCUTANEOUS
  Filled 2020-02-29: qty 1

## 2020-02-29 MED ORDER — SODIUM CHLORIDE 0.9 % IV SOLN
INTRAVENOUS | Status: DC
  Administered 2020-02-29: 125 mL/h via INTRAVENOUS

## 2020-02-29 MED ORDER — INSULIN ASPART PROT & ASPART (70-30 MIX) 100 UNIT/ML ~~LOC~~ SUSP: 30.0000 [IU] | Freq: Two times a day (BID) | SUBCUTANEOUS | Status: DC

## 2020-02-29 MED ORDER — INSULIN ASPART 100 UNIT/ML ~~LOC~~ SOLN
0.0000 [IU] | Freq: Every day | SUBCUTANEOUS | Status: DC
  Administered 2020-03-01: 1 [IU] via SUBCUTANEOUS
  Filled 2020-02-29: qty 1

## 2020-02-29 MED ORDER — CHLORHEXIDINE GLUCONATE CLOTH 2 % EX PADS
6.0000 | MEDICATED_PAD | Freq: Every day | CUTANEOUS | Status: DC
  Administered 2020-02-29 – 2020-03-01 (×2): 6 via TOPICAL

## 2020-02-29 NOTE — Progress Notes (Signed)
Initial Nutrition Assessment  DOCUMENTATION CODES:   Severe malnutrition in context of social or environmental circumstances, Underweight  INTERVENTION:  Recommend liberalizing diet to carbohydrate modified.  Provide Ensure Max Protein po BID, each supplement provides 150 kcal and 30 grams of protein.  Monitor magnesium, potassium, and phosphorus daily for at least 3 days, MD to replete as needed, as pt is at risk for refeeding syndrome given severe malnutrition.  NUTRITION DIAGNOSIS:   Severe Malnutrition related to social / environmental circumstances(inadequate oral intake, difficulty obtaining food at home) as evidenced by severe fat depletion, severe muscle depletion.  GOAL:   Patient will meet greater than or equal to 90% of their needs  MONITOR:   PO intake, Supplement acceptance, Labs, Weight trends, Skin, I & O's  REASON FOR ASSESSMENT:   Consult Assessment of nutrition requirement/status  ASSESSMENT:   44 year old male with PMHx of insulin-dependent DM complicated by gastroparesis, several admissions for DKA, malnutrition, hx COVID 04/2019 admitted with DKA, AKI, urinary retention.   Met with patient at bedside. He is well-known to this RD from multiple recent admissions. Patient reports he lives with his family. He was not able to eat for 3-4 days PTA. He also was not taking insulin. Patient reports he is still having difficulty with affording food at home. He reports he has not yet reached out to any of the nutrition-related community resources provided to patient by this RD on 3/19 (provided [place]). He has a very good appetite and is eating well at meals. He is eating 100% of his meals here. He also had multiple snacks at bedside and had finished 2 bottles of Ensure.  Patient continues to lose weight. He was 61.2 kg on 11/18/2019. He is now 46.2 kg (101.85 lbs). He has lost 15 kg (24.5% body weight) over the past 3 months, which is  significant for time frame.  Medications reviewed and include: Novolog 0-9 units TID, Novolog 0-5 units QHS, Novolog Mix 70/30 30 units BID, NS at 125 mL/hr.  Labs reviewed: CBG 91-275, Sodium 132, CO2 21, BUN 57, Creatinine 1.64.  Discussed with RN and on rounds.  NUTRITION - FOCUSED PHYSICAL EXAM:    Most Recent Value  Orbital Region  Severe depletion  Upper Arm Region  Severe depletion  Thoracic and Lumbar Region  Severe depletion  Buccal Region  Severe depletion  Temple Region  Severe depletion  Clavicle Bone Region  Severe depletion  Clavicle and Acromion Bone Region  Severe depletion  Scapular Bone Region  Severe depletion  Dorsal Hand  Severe depletion  Patellar Region  Severe depletion  Anterior Thigh Region  Severe depletion  Posterior Calf Region  Severe depletion  Edema (RD Assessment)  None  Hair  Reviewed  Eyes  Reviewed  Mouth  Reviewed  Skin  Reviewed  Nails  Reviewed     Diet Order:   Diet Order            Diet heart healthy/carb modified Room service appropriate? Yes; Fluid consistency: Thin  Diet effective now             EDUCATION NEEDS:   No education needs have been identified at this time  Skin:  Skin Assessment: Skin Integrity Issues:(stg 2 left elbow (2cm x 2cm); stg 3 right elbow (2cm x 2cm))  Last BM:  02/28/2020  Height:   Ht Readings from Last 1 Encounters:  02/29/20 _0  (1.727 m)   Weight:   Wt Readings  from Last 1 Encounters:  02/29/20 46.2 kg   Ideal Body Weight:  70 kg  BMI:  Body mass index is 15.49 kg/m.  Estimated Nutritional Needs:   Kcal:  1800-2000  Protein:  80-90 grams  Fluid:  1.5-1.8 L/day  Jacklynn Barnacle, MS, RD, LDN Pager number available on Amion

## 2020-02-29 NOTE — TOC Initial Note (Signed)
Transition of Care [facility]) - Initial/Assessment Note    Patient Details  Name: Gregory Moody MRN: [id number] Date of Birth: Jul 22, 1976  [facility] CM/SW Contact:    Magnus Ivan, LCSW Phone Number: 02/29/2020, 12:49 PM  Clinical Narrative:                CSW consulted for SNF placement.   CSW met with patient at bedside to discuss consult and to complete Readmission Screening. Per chart, patient needs Interpreter. However, spoke with staff who reported patient speaks [place]. Met with patient at bedside who declined need for Interpreter.  Patient reported he lives with his Mother, Father, and son. Patient reported he has reliable transportation to appointments. PCP is [facility]. Patient did not say what pharmacy he uses, but reported he does not have issues with obtaining medications. Patient confirmed he has a walker at home. CSW informed patient of MD recommendation for SNF. Patient said he is uninsured and cannot afford to privately pay for a SNF. Patient said he is working with someone at [employer] on a Medicaid application. Patient reported he does not have Home Health services. He reported he is agreeable to [facility] or DME if recommended at discharge. He gave permisson for TOC to reach out to agencies as needed.  CSW spoke with Advanced Representative [name] who reported they have made multiple attempts to start [place] with patient through charity care, but have been unable to reach patient, and are not able to accept him back if [facility] is needed.   CSW will continue to follow.   Expected Discharge Plan: Home/Self Care Barriers to Discharge: Continued Medical Work up   Patient Goals and CMS Choice Patient states their goals for this hospitalization and ongoing recovery are:: to return home with family CMS Medicare.gov Compare Post Acute Care list provided to:: Patient    Expected Discharge Plan and Services Expected Discharge Plan:  Home/Self Care       Living arrangements for the past 2 months: Single Family Home                                      Prior Living Arrangements/Services Living arrangements for the past 2 months: Single Family Home Lives with:: Parents, Adult Children Patient language and need for interpreter reviewed:: Yes(Per chart, patient needs interpreter. Spoke with Nurse Secretery who reported patient speaks [place]. Patient spoke English during encounter.) Do you feel safe going back to the place where you live?: Yes      Need for Family Participation in Patient Care: Yes (Comment) Care giver support system in place?: Yes (comment) Current home services: DME Criminal Activity/Legal Involvement Pertinent to Current Situation/Hospitalization: No - Comment as needed  Activities of Daily Living Home Assistive Devices/Equipment: None ADL Screening (condition at time of admission) Patient's cognitive ability adequate to safely complete daily activities?: Yes Is the patient deaf or have difficulty hearing?: No Does the patient have difficulty seeing, even when wearing glasses/contacts?: No Does the patient have difficulty concentrating, remembering, or making decisions?: No Patient able to express need for assistance with ADLs?: Yes Does the patient have difficulty dressing or bathing?: No Independently performs ADLs?: Yes (appropriate for developmental age) Does the patient have difficulty walking or climbing stairs?: Yes Weakness of Legs: Both Weakness of Arms/Hands: Both  Permission Sought/Granted Permission sought to share information with : Chartered certified accountant granted  to share information with : Yes, Verbal Permission Granted     Permission granted to share info w AGENCY: [place], Equipment Agencies- as needed        Emotional Assessment Appearance:: Appears stated age Attitude/Demeanor/Rapport: Engaged Affect (typically observed): Calm,  Appropriate Orientation: : Oriented to Situation, Oriented to  Time, Oriented to Place, Oriented to Self Alcohol / Substance Use: Not Applicable Psych Involvement: No (comment)  Admission diagnosis:  Hyperkalemia [E87.5] DKA (diabetic ketoacidoses) ([place]) [E11.10] Hyperglycemia [R73.9] AKI (acute kidney injury) ([place]) [N17.9] Diabetic ketoacidosis without coma associated with type 2 diabetes mellitus ([place]) [E11.10] Patient Active Problem List   Diagnosis Date Noted  . Hyperkalemia   . Iron deficiency anemia   . Weight loss   . Chronic diarrhea 02/07/2020  . Abnormal CT of the chest   . Acute urinary retention 02/06/2020  . Hyperglycemia due to type 2 diabetes mellitus ([place]) 02/06/2020  . History of Clostridium difficile colitis 02/06/2020  . Generalized weakness 02/06/2020  . History of COVID-19 02/06/2020  . Diarrhea 01/21/2020  . Tobacco abuse 01/21/2020  . C. difficile colitis 01/21/2020  . Urinary retention 01/21/2020  . Diabetes mellitus without complication ([place]) 52/05/222  . Protein-calorie malnutrition, severe 01/02/2020  . Odynophagia   . DKA (diabetic ketoacidoses) ([place]) 12/27/2019  . Depression 12/27/2019  . DKA, type 2 ([place]) 12/27/2019  . Anemia of chronic disease 04/25/2019  . Hypomagnesemia 04/25/2019  . Hypotension 04/23/2019  . CAP (community acquired pneumonia) due to MSSA (methicillin sensitive Staphylococcus aureus) ([place]) 04/14/2019  . COVID-19 virus infection 04/14/2019  . Pressure injury of skin 03/31/2019  . Acute renal failure (ARF) ([place]) 03/06/2019  . CAP (community acquired pneumonia) 02/14/2019  . AKI (acute kidney injury) ([place]) 02/02/2019  . ARF (acute renal failure) ([place]) 01/03/2019  . Malnutrition of moderate degree 02/24/2018  . GERD (gastroesophageal reflux disease) 02/23/2018  . Diabetic gastroparesis ([place]) 02/23/2018  . Diabetic foot ulcer ([place]) 02/23/2018  . Aspiration pneumonia ([place]) 04/21/2017  . Hypoglycemia 04/21/2017  . Diabetes  mellitus with hyperglycemia ([place]) 04/21/2017  . Hip fracture, unspecified laterality, closed, initial encounter ([place]) 04/17/2017  . Hyperglycemia 04/17/2017  . Malnutrition ([place]) 04/09/2016  . Cavitary lesion of lung 04/06/2016   PCP:  [facility]:   [place], [place] - [address] [place] [address] Phone: [phone] Fax: [phone]  [facility] [address] ([address]), [place] - [address] [place]) [place] [address] Phone: [phone] Fax: [phone]  [facility] [place] [address] [place] [address] Phone: [phone] Fax: [phone]     Social Determinants of Health (SDOH) Interventions    Readmission Risk Interventions Readmission Risk Prevention Plan 02/29/2020 04/19/2019 03/07/2019  Transportation Screening Complete Complete Complete  PCP or Specialist Appt within 3-5 Days - - Complete  Social Work Consult for [place] Planning/Counseling - - (No Data)  Palliative Care Screening - - Not Applicable  Medication Review Press photographer) Complete Complete Complete  PCP or Specialist appointment within 3-5 days of discharge Complete Complete -  [place] or Home Care Consult Complete Complete -  SW Recovery Care/Counseling Consult - Complete -  Palliative Care Screening - Not Applicable -  [place] Complete Not Applicable -  Some recent data might be hidden

## 2020-02-29 NOTE — Evaluation (Signed)
Occupational Therapy Evaluation Patient Details Name: Neizan Debruhl MRN: [id number] DOB: 08/24/1976 Today's Date: 02/29/2020    History of Present Illness Per MD note: [name] is a 44 y.o. 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 difficile colitis, undergoing colonoscopy with negative biopsies, with hospitalization being complicated by acute urinary retention requiring temporary Foley placement, followed by another admission from 02/06/2020 to 02/09/2020 with similar complaints and urinary retention requiring Foley catheter.  Again came with complaints of generalized weakness, lack of appetite and nausea.  Found to be in DKA.  Not using his insulin as directed since prior to discharge.  Also AKI secondary to urinary retention.   Clinical Impression   Pt was seen for OT evaluation this date. Prior to hospital admission, pt reports being Indep with BADLs and MOD I with fxl mobility with QC. Pt lives in mobile home with 5 STE with bilateral railing with his parents and son. Currently pt presents with gross weakness of trunk and limbs. In addition pt with below-described impairments (See OT problem list) which functionally limit his ability to perform ADL/self-care tasks. Pt currently requires MIN A for ADL transfers with RW, MIN A with LB ADLs including bathing and dressing d/t decreased fxl activity tolerance and decreased dynamic sitting balance.  Pt would benefit from skilled OT to address noted impairments and functional limitations (see below for any additional details) in order to maximize safety and independence while minimizing falls risk and caregiver burden. Upon hospital discharge, recommend HHOT to  maximize pt safety and return to functional independence during meaningful occupations of daily life.     Follow Up Recommendations  Home health OT    Equipment Recommendations  3 in 1 bedside commode;Tub/shower seat    Recommendations for Other Services       Precautions / Restrictions Precautions Precautions: Fall Restrictions Weight Bearing Restrictions: No      Mobility Bed Mobility Overal bed mobility: Needs Assistance Bed Mobility: Supine to Sit     Supine to sit: Min assist;HOB elevated     General bed mobility comments: Pt requires MIN A to stabilize trunk, able to "walk" LEs to EOB I'ly  Transfers Overall transfer level: Needs assistance Equipment used: Rolling walker (2 wheeled) Transfers: Sit to/from Stand Sit to Stand: Min assist;From elevated surface(ICU bed height)              Balance Overall balance assessment: Needs assistance Sitting-balance support: Feet supported;Single extremity supported Sitting balance-Leahy Scale: Fair     Standing balance support: During functional activity;Bilateral upper extremity supported Standing balance-Leahy Scale: Fair Standing balance comment: B UE support on RW                           ADL either performed or assessed with clinical judgement   ADL Overall ADL's : Needs assistance/impaired Eating/Feeding: Set up;Sitting Eating/Feeding Details (indicate cue type and reason): weak grasp for opening food items, otherwise able to complete task based on clinical obs Grooming: Wash/dry face;Set up;Sitting           Upper Body Dressing : Set up;Sitting   Lower Body Dressing: Minimal assistance;Sitting/lateral leans Lower Body Dressing Details (indicate cue type and reason): MIN A to don socks d/t decresed activity tolerance/dynamic sitting Toilet  Transfer: Minimal assistance;Stand-pivot;BSC                   Vision Patient Visual Report: No change from baseline Additional Comments:  tracks appropriately throughout, reports no new vision changes     Perception     Praxis      Pertinent Vitals/Pain Pain Assessment: No/denies pain     Hand Dominance Right   Extremity/Trunk Assessment Upper Extremity Assessment Upper Extremity Assessment: Generalized weakness(ROM WFL, shld, elbow, grasp strength all grossly 4-/5)   Lower Extremity Assessment Lower Extremity Assessment: Defer to PT evaluation;Generalized weakness       Communication Communication Communication: No difficulties   Cognition Arousal/Alertness: Awake/alert Behavior During Therapy: WFL for tasks assessed/performed Overall Cognitive Status: Within Functional Limits for tasks assessed                                 General Comments: while pt is A&O and appropraite conversationally, he lacks insight into basic sustination of his health and well being. Difficult to ascertain if this is emotionally motivated, potentially accessability motivated, etc.   General Comments       Exercises Other Exercises Other Exercises: OT facilitates education re: role of OT versus PT as well as importance of OOB activity both while in acute setting and at home to prevent muscle wasting. Pt with good reception, but low motivation. Other Exercises: OT facilitates pt participation in postural extension exercise for 1 set x10 reps with 2-3 second hold to improve posture and balance for more dynamic tasks.   Shoulder Instructions      Home Living Family/patient expects to be discharged to:: Private residence Living Arrangements: Parent;Children(Mom, dad and son) Available Help at Discharge: Family;Available 24 hours/day Type of Home: Mobile home Home Access: Stairs to enter Entrance Stairs-Number of Steps: 5 Entrance Stairs-Rails: Right;Left Home Layout: One level     Bathroom Shower/Tub: Occupational psychologist: Standard     Home Equipment: Environmental consultant - 2 wheels;Cane - quad;Cane - single  point          Prior Functioning/Environment Level of Independence: Independent with assistive device(s)        Comments: Pt reports he typically uses QC for fxl mobility, endorses several falls d/t weakness. States he mostly just stayed in bed for 4-5 days before coming to hospital. States he does drive "sometimes". Does not work. States able to do BADLs I'ly. Parents mostly cook/clean per pt.        OT Problem List: Decreased strength;Decreased activity tolerance;Impaired balance (sitting and/or standing);Decreased knowledge of use of DME or AE      OT Treatment/Interventions: Self-care/ADL training;Therapeutic exercise;Therapeutic activities;Patient/family education;Balance training;DME and/or AE instruction    OT Goals(Current goals can be found in the care plan section) Acute Rehab OT Goals Patient Stated Goal: To get stronger OT Goal Formulation: With patient Time For Goal Achievement: 03/14/20 Potential to Achieve Goals: Good  OT Frequency: Min 2X/week   Barriers to D/C:            Co-evaluation              AM-PAC OT "6 Clicks" Daily Activity     Outcome Measure Help from another person eating meals?: None Help from another person taking care of personal grooming?: A Little Help from another person toileting, which includes using toliet, bedpan, or urinal?: A Little Help from another person bathing (including washing, rinsing, drying)?:  A Little Help from another person to put on and taking off regular upper body clothing?: A Little Help from another person to put on and taking off regular lower body clothing?: A Little 6 Click Score: 19   End of Session Equipment Utilized During Treatment: Gait belt;Rolling walker Nurse Communication: Mobility status  Activity Tolerance: Patient tolerated treatment well Patient left: Other (comment)(seated EOB with PT prepping to begin section)  OT Visit Diagnosis: Unsteadiness on feet (R26.81);Muscle weakness  (generalized) (M62.81);History of falling (Z91.81)                Time: 6599-3570 OT Time Calculation (min): 30 min Charges:  OT General Charges $OT Visit: 1 Visit OT Evaluation $OT Eval Moderate Complexity: 1 Mod OT Treatments $Self Care/Home Management : 8-22 mins  Gerrianne Scale, MS, OTR/L ascom [phone] 02/29/20, 2:03 PM

## 2020-02-29 NOTE — Evaluation (Signed)
Physical Therapy Evaluation Patient Details Name: Gregory Moody MRN: [id number] DOB: October 19, 1975 Today's Date: 02/29/2020   History of Present Illness  44 y.o. 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 difficile colitis, undergoing colonoscopy with negative biopsies, with hospitalization being complicated by acute urinary retention requiring temporary Foley placement, followed by another admission from 02/06/2020 to 02/09/2020 with similar complaints and urinary retention requiring Foley catheter.  Again came with complaints of generalized weakness, lack of appetite and nausea.  Found to be in DKA.  Not using his insulin as directed since prior to discharge.  Also AKI secondary to urinary retention.  Clinical Impression  Pt did relatively well with PT exam today, he did not need a lot of assist with most mobility and was able to ambulate ~75 ft with walker and no excessive fatigue or overt safety issues (he did need a few direct assist moments to pull walker back close enough to be effective).  There are conflicting reports as to how much he has been doing at home, but regardless it is evident that he is struggling to care for himself recently.  From a purely PT perspective he probably does not require STR, however given issues with meds, motivation, activity, etc a higher level of care/involvement may be needed going forward.     Follow Up Recommendations Supervision for mobility/OOB;Home health PT(pt has been having issues with meds and self management )    Equipment Recommendations  None recommended by PT(pt does indicate his walker it too short at home?)    Recommendations for Other Services       Precautions /  Restrictions Precautions Precautions: Fall Restrictions Weight Bearing Restrictions: No      Mobility  Bed Mobility Overal bed mobility: Needs Assistance Bed Mobility: Supine to Sit     Supine to sit: Min assist;HOB elevated     General bed mobility comments: sitting EOB on arrival, not tested  Transfers Overall transfer level: Needs assistance Equipment used: Rolling walker (2 wheeled) Transfers: Sit to/from Stand Sit to Stand: Min guard         General transfer comment: cuing for UE use, able to rise w/o direct assist  Ambulation/Gait Ambulation/Gait assistance: Min guard;Min assist Gait Distance (Feet): 75 Feet Assistive device: Rolling walker (2 wheeled)       General Gait Details: Pt showed great effort with ambulation, did need occasional direct cuing to insure walker not too far ahead of him and to assist with postural adjustment, but ultimately he did better than expected given the report that he had not gotten out of bed for days before coming in.  O2 readings inconsistent but appeared to remain in the high 90s t/o the effort.  Stairs            Wheelchair Mobility    Modified Rankin (Stroke Patients Only)       Balance Overall balance assessment: Needs assistance Sitting-balance support: Feet supported;Single extremity supported Sitting balance-Leahy Scale: Good     Standing balance support: During functional activity;Bilateral upper extremity supported Standing balance-Leahy Scale: Fair Standing balance comment: reliant on walker, but relatively stable with it's use  Pertinent Vitals/Pain Pain Assessment: No/denies pain    Home Living Family/patient expects to be discharged to:: Private residence Living Arrangements: Parent;Children Available Help at Discharge: Family;Available 24 hours/day Type of Home: Mobile home Home Access: Stairs to enter Entrance Stairs-Rails: Right;Left Entrance  Stairs-Number of Steps: 5 Home Layout: One level Home Equipment: Walker - 2 wheels;Cane - quad;Cane - single point      Prior Function Level of Independence: Independent with assistive device(s)         Comments: Pt reports he typically uses QC for fxl mobility, endorses several falls d/t weakness. States he mostly just stayed in bed for 4-5 days before coming to hospital. States he does drive "sometimes". Does not work. States able to do BADLs I'ly. Parents mostly cook/clean per pt.     Hand Dominance   Dominant Hand: Right    Extremity/Trunk Assessment   Upper Extremity Assessment Upper Extremity Assessment: Defer to OT evaluation    Lower Extremity Assessment Lower Extremity Assessment: Generalized weakness;Overall WFL for tasks assessed(minimal ROM limitaions but functional t/o grossly 3+ to 4-/5)       Communication   Communication: No difficulties  Cognition Arousal/Alertness: Awake/alert Behavior During Therapy: WFL for tasks assessed/performed Overall Cognitive Status: Within Functional Limits for tasks assessed                                 General Comments: while pt is A&O and appropraite conversationally, he lacks insight into basic sustination of his health and well being. Difficult to ascertain if this is emotionally motivated, potentially accessability motivated, etc.      General Comments      Exercises Other Exercises Other Exercises: OT facilitates education re: role of OT versus PT as well as importance of OOB activity both while in acute setting and at home to prevent muscle wasting. Pt with good reception, but low motivation.   Assessment/Plan    PT Assessment Patient needs continued PT services  PT Problem List Decreased strength;Decreased range of motion;Decreased activity tolerance;Decreased balance;Decreased mobility;Decreased knowledge of use of DME;Decreased safety awareness       PT Treatment Interventions DME  instruction;Gait training;Stair training;Functional mobility training;Therapeutic activities;Therapeutic exercise;Balance training;Neuromuscular re-education;Patient/family education    PT Goals (Current goals can be found in the Care Plan section)  Acute Rehab PT Goals Patient Stated Goal: To get stronger PT Goal Formulation: With patient Time For Goal Achievement: 03/14/20 Potential to Achieve Goals: Fair    Frequency Min 2X/week   Barriers to discharge        Co-evaluation               AM-PAC PT "6 Clicks" Mobility  Outcome Measure Help needed turning from your back to your side while in a flat bed without using bedrails?: A Little Help needed moving from lying on your back to sitting on the side of a flat bed without using bedrails?: A Little Help needed moving to and from a bed to a chair (including a wheelchair)?: A Little Help needed standing up from a chair using your arms (e.g., wheelchair or bedside chair)?: A Little Help needed to walk in hospital room?: A Little Help needed climbing 3-5 steps with a railing? : A Lot 6 Click Score: 17    End of Session Equipment Utilized During Treatment: Gait belt Activity Tolerance: Patient tolerated treatment well Patient left: in bed;with call bell/phone within reach Nurse Communication: Mobility status PT Visit  Diagnosis: Unsteadiness on feet (R26.81);Muscle weakness (generalized) (M62.81);History of falling (Z91.81);Repeated falls (R29.6);Difficulty in walking, not elsewhere classified (R26.2)    Time: 6168-3729 PT Time Calculation (min) (ACUTE ONLY): 24 min   Charges:   PT Evaluation $PT Eval Low Complexity: 1 Low PT Treatments $Gait Training: 8-22 mins        Kreg Shropshire, DPT 02/29/2020, 2:05 PM

## 2020-02-29 NOTE — Progress Notes (Signed)
PROGRESS NOTE    Gregory Moody  SNK:[id number] DOB: 1976/09/13 DOA: 02/28/2020 PCP: [place]   Brief Narrative: Gregory Moody is a 44 y.o. 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 difficile colitis, undergoing colonoscopy with negative biopsies, with hospitalizationbeing complicated by acute urinary retention requiring temporary Foley placement, followed by another admission from 02/06/2020 to 02/09/2020 with similar complaints and urinary retention requiring Foley catheter.  Again came with complaints of generalized weakness, lack of appetite and nausea.  Found to be in DKA.  Not using his insulin as directed since prior to discharge.  Also AKI secondary to urinary retention.  Subjective: Patient was feeling better when seen today.  No new complaints.  Assessment & Plan:   Active Problems:   Hyperglycemia   DKA (diabetic ketoacidoses) ([place])  Insulin-dependent diabetes with DKA.   DKA resolved.  Uncontrolled diabetes with hyperglycemia, A1c of 10.8 in April 2021.  Multiple prior admissions with similar symptoms in DKA.  Apparently does not take insulin at home. -Patient closed his gap and was started on basal with SSI. -Consult to dietitian. -Consult to diabetic coordinator for further education.  Patient is not using his insulin as directed at home and resulted in multiple recurrent admissions with DKA.  AKI secondary to urinary retention.  Patient has an history of prior AKI due to urinary retention, requiring Foley catheter in the past.  During prior admission he was started on Flomax which he never took. Creatinine improving after placing catheter and IV hydration. Suspecting  some element of neurogenic bladder due to uncontrolled diabetes. -Urology consult. -Patient will need to be discharged on Foley. -Continue Flomax.  Generalized weakness Diabetic gastroparesis (HCC) Malnutrition, protein calorie, suspect severe(HCC). Patient is unable to take care of himself at home.  Per patient he did not eat anything for the past week. -Nutritionist consult. -PT evaluation-might need placement as patient with multiple admissions due to similar reasons and unable to take care of himself at home. -Psych consult to see if there is any underlying reason which is a barrier to take care of himself.  Hyperkalemia.  Resolved. -New to monitor.  History of COVID-19July 2020 -No acute concerns.  Objective: Vitals:   02/29/20 0800 02/29/20 0900 02/29/20 1000 02/29/20 1100  BP: 122/86 103/73  (!) 136/92  Pulse: 65 70 75 70  Resp: (!) 8 16 16 16   Temp:  (!) 96.2 F (35.7 C)    TempSrc:  Axillary    SpO2: 100% 100% 100% 100%  Weight:      Height:        Intake/Output Summary (Last 24 hours) at 02/29/2020 1238 Last data filed at 02/29/2020 1026 Gross per 24 hour  Intake 480 ml  Output 1350 ml  Net -870 ml   Filed Weights   02/28/20 2148 02/29/20 0328  Weight: 54.4 kg 46.2 kg    Examination:  General exam: Emaciated gentleman, appears calm and comfortable  Respiratory system: Clear to auscultation. Respiratory effort normal. Cardiovascular system: S1 & S2 heard, RRR. No JVD, murmurs, rubs, gallops Gastrointestinal system: Soft, nontender, nondistended, bowel sounds positive. Central nervous system: Alert and oriented. No focal neurological deficits.Symmetric 5 x 5 power. Extremities: No edema, no cyanosis, pulses intact and symmetrical. Psychiatry: Judgement and insight appear normal.    DVT prophylaxis: Lovenox  Code Status: Full Family Communication: Tried calling son and sister multiple times with no response. Disposition Plan:  Status is: Inpatient   Remains inpatient appropriate because:Inpatient level of care appropriate due to severity of illness   Dispo: The patient is from: Home              Anticipated d/c is to: To be determined.              Anticipated d/c date is: 1 day              Patient currently is not medically stable to d/c.  Patient with recurrent admissions with DKA and urinary retention.  Does not want to use insulin or keep Foley in at home.  Getting psych evaluation and TOC is involved for further disposition plan.  Consultants:   Urology  Psychiatry  Procedures:  Antimicrobials:   Data Reviewed: I have personally reviewed following labs and imaging studies  CBC: Recent Labs  Lab 02/28/20 1507 02/29/20 0508  WBC 10.8* 8.1  HGB 11.0* 8.5*  HCT 33.9* 25.0*  MCV 96.0 92.9  PLT 354 563   Basic Metabolic Panel: Recent Labs  Lab 02/28/20 1507 02/28/20 2106 02/29/20 0128 02/29/20 0508  NA 131* 130* 130* 132*  K 5.5* 4.2 3.9 3.8  CL 95* 101 101 103  CO2 17* 19* 21* 21*  GLUCOSE 547* 280* 218* 140*  BUN 79* 64* 63* 57*  CREATININE 3.38* 2.35* 2.22* 1.64*  CALCIUM 9.5 8.6* 8.6* 8.6*   GFR: Estimated Creatinine Clearance: 37.6 mL/min (A) (by C-G formula based on SCr of 1.64 mg/dL (H)). Liver Function Tests: Recent Labs  Lab 02/28/20 1507  AST 10*  ALT 12  ALKPHOS 94  BILITOT 1.1  PROT 8.0  ALBUMIN 3.6   No results for input(s): LIPASE, AMYLASE in the last 168 hours. No results for input(s): AMMONIA in the last 168 hours. Coagulation Profile: No results for input(s): INR, PROTIME in the last 168 hours. Cardiac Enzymes: No results for input(s): CKTOTAL, CKMB, CKMBINDEX, TROPONINI in the last 168 hours. BNP (last 3 results) No results for input(s): PROBNP in the last 8760 hours. HbA1C: No results for input(s): HGBA1C in the last 72 hours. CBG: Recent Labs  Lab 02/29/20 0327 02/29/20 0448 02/29/20 0600 02/29/20 0730 02/29/20 1154  GLUCAP 156* 134* 117* 91 275*   Lipid  Profile: No results for input(s): CHOL, HDL, LDLCALC, TRIG, CHOLHDL, LDLDIRECT in the last 72 hours. Thyroid Function Tests: No results for input(s): TSH, T4TOTAL, FREET4, T3FREE, THYROIDAB in the last 72 hours. Anemia Panel: No results for input(s): VITAMINB12, FOLATE, FERRITIN, TIBC, IRON, RETICCTPCT in the last 72 hours. Sepsis Labs: No results for input(s): PROCALCITON, LATICACIDVEN in the last 168 hours.  Recent Results (from the past 240 hour(s))  SARS Coronavirus 2 by RT PCR (hospital order, performed in [facility] hospital lab) Nasopharyngeal Nasopharyngeal Swab     Status: None   Collection Time: 02/28/20  6:18 PM   Specimen: Nasopharyngeal Swab  Result Value Ref Range Status   SARS Coronavirus 2 NEGATIVE NEGATIVE Final    Comment: (NOTE) SARS-CoV-2 target nucleic acids are NOT DETECTED. The SARS-CoV-2 RNA is generally detectable in upper and lower respiratory specimens during the acute phase of infection. The lowest concentration of SARS-CoV-2 viral copies this assay can detect is 250 copies / mL. A negative result does not preclude SARS-CoV-2 infection and should not be used as the sole basis for treatment or other patient management decisions.  A  negative result may occur with improper specimen collection / handling, submission of specimen other than nasopharyngeal swab, presence of viral mutation(s) within the areas targeted by this assay, and inadequate number of viral copies (<250 copies / mL). A negative result must be combined with clinical observations, patient history, and epidemiological information. Fact Sheet for Patients:   StrictlyIdeas.no Fact Sheet for Healthcare Providers: BankingDealers.co.za This test is not yet approved or cleared  by the [place] FDA and has been authorized for detection and/or diagnosis of SARS-CoV-2 by FDA under an Emergency Use Authorization (EUA).  This EUA will remain in effect  (meaning this test can be used) for the duration of the COVID-19 declaration under Section 564(b)(1) of the Act, 21 U.S.C. section 360bbb-3(b)(1), unless the authorization is terminated or revoked sooner. Performed at [facility], [address] [place], [place] [address]      Radiology Studies: DG Chest Portable 1 View  Result Date: 02/28/2020 CLINICAL DATA:  Weakness. Body aches. Patient has history of COVID and tuberculosis infection. EXAM: PORTABLE CHEST 1 VIEW COMPARISON:  Most recent radiograph 02/07/2020. Most recent CT 12/29/2019, chest CT 04/06/2016 also reviewed FINDINGS: Chronic airspace opacity in the perifissural right upper lobe is likely post infectious scarring, and has been present over multiple prior exams. The previous nodular opacities in the left lung have improved, with mild streaky residual at the left lung base, partially obscured by overlying EKG leads. No evidence of new or acute airspace disease. Normal heart size and mediastinal contours. No pulmonary edema, pneumothorax, or large pleural effusion. Multiple overlying monitoring devices in place. IMPRESSION: 1. No acute abnormality. 2. Chronic airspace opacity in the perifissural right upper lobe is likely related to scarring. This has been present over multiple prior exams. 3. Previous nodular opacities in the left lung have improved, with mild streaky residual at the left lung base. Electronically Signed   By: Keith Rake M.D.   On: 02/28/2020 15:47    Scheduled Meds: . Chlorhexidine Gluconate Cloth  6 each Topical Daily  . enoxaparin (LOVENOX) injection  30 mg Subcutaneous Q24H  . insulin aspart  0-20 Units Subcutaneous Q4H  . insulin aspart protamine- aspart  30 Units Subcutaneous BID WC  . sodium chloride flush  3 mL Intravenous Q12H  . tamsulosin  0.4 mg Oral QPC breakfast   Continuous Infusions: . sodium chloride Stopped (02/28/20 2209)  . sodium chloride 125 mL/hr (02/29/20 0609)  .  dextrose 5 % and 0.45% NaCl 75 mL/hr at 02/28/20 2211  . insulin Stopped (02/29/20 0600)     LOS: 1 day   Time spent: 45 minutes.  Lorella Nimrod, MD [facility]  If 7PM-7AM, please contact night-coverage [email or web address]  02/29/2020, 12:38 PM   This record has been created using Systems analyst. Errors have been sought and corrected,but may not always be located. Such creation errors do not reflect on the standard of care.

## 2020-02-29 NOTE — Progress Notes (Signed)
Patient had fallen- he stated he slipped to the floor on his buttocks- he stated he did not hit anything- Dr. Reesa Chew made aware- no xrays ordered.  Tele sitter ordered and pt now in a low bed.

## 2020-02-29 NOTE — ED Notes (Addendum)
Patient's blood pressure running low, systolic in 05W. NP messaged for orders

## 2020-02-29 NOTE — ED Notes (Signed)
Patient taken in bed to ICU, clothing with patient in bed

## 2020-02-29 NOTE — ED Notes (Signed)
Pharmacy messaged to retime long-acting insulin so patient can be taken off insulin gtt

## 2020-02-29 NOTE — Progress Notes (Signed)
Inpatient Diabetes Program Recommendations  AACE/ADA: New Consensus Statement on Inpatient Glycemic Control (2015)  Target Ranges:  Prepandial:   less than 140 mg/dL      Peak postprandial:   less than 180 mg/dL (1-2 hours)      Critically ill patients:  140 - 180 mg/dL   Lab Results  Component Value Date   GLUCAP 275 (H) 02/29/2020   HGBA1C 10.8 (H) 02/07/2020    Review of Glycemic Control Results for RAHM, MINIX (MRN [id number]) as of 02/29/2020 15:55  Ref. Range 02/29/2020 03:27 02/29/2020 04:48 02/29/2020 06:00 02/29/2020 07:30 02/29/2020 11:54  Glucose-Capillary Latest Ref Range: 70 - 99 mg/dL 156 (H) 134 (H) 117 (H) 91 275 (H)   Diabetes history: DM1 Outpatient Diabetes medications:  Novolin 70/30 30 units bid Current orders for Inpatient glycemic control:  Novolog resistant q 4 hours Novolog 70/30 mix 30 units bid with meals  Inpatient Diabetes Program Recommendations:    Please reduce Novolog correction to sensitive tid with meals and HS.   Spoke briefly with patient.  He states that he has not taken his insulin for 4-5 days.  Explained importance of taking insulin and he said that sometimes his Mom helps him but not always?? States that when he does not eat, he does not take insulin due to concerns regarding lows?  I explained that he "has to eat" in order to take his insulin and to prevent coming to the hospital. It appears that he did not f/u with medication management clinic and will not be able to get medications from there anymore??  He can purchase 70/30 from [facility] for 25$ a vial however unsure that he will do this?? Sent message to MD and [facility] social worker??  Also note pending psych consult? He has been spoken to on several occasions by Diabetes Coordinators in the past.  Reiterated again the importance of him taking his insulin as ordered and eating to avoid hypoglycemia.  Thanks,  Adah Perl, RN, BC-ADM Inpatient Diabetes Coordinator Pager [phone]  (8a-5p)

## 2020-03-01 LAB — RENAL FUNCTION PANEL
Albumin: 2.7 g/dL — ABNORMAL LOW (ref 3.5–5.0)
Anion gap: 7 (ref 5–15)
BUN: 31 mg/dL — ABNORMAL HIGH (ref 6–20)
CO2: 20 mmol/L — ABNORMAL LOW (ref 22–32)
Calcium: 7.9 mg/dL — ABNORMAL LOW (ref 8.9–10.3)
Chloride: 100 mmol/L (ref 98–111)
Creatinine, Ser: 1.28 mg/dL — ABNORMAL HIGH (ref 0.61–1.24)
GFR calc Af Amer: >60 mL/min (ref >60)
GFR calc non Af Amer: >60 mL/min (ref >60)
Glucose, Bld: 229 mg/dL — ABNORMAL HIGH (ref 70–99)
Phosphorus: 1.9 mg/dL — ABNORMAL LOW (ref 2.5–4.6)
Potassium: 3.8 mmol/L (ref 3.5–5.1)
Sodium: 127 mmol/L — ABNORMAL LOW (ref 135–145)

## 2020-03-01 LAB — GLUCOSE, CAPILLARY
Glucose-Capillary: 138 mg/dL — ABNORMAL HIGH (ref 70–99)
Glucose-Capillary: 141 mg/dL — ABNORMAL HIGH (ref 70–99)
Glucose-Capillary: 166 mg/dL — ABNORMAL HIGH (ref 70–99)
Glucose-Capillary: 171 mg/dL — ABNORMAL HIGH (ref 70–99)
Glucose-Capillary: 57 mg/dL — ABNORMAL LOW (ref 70–99)
Glucose-Capillary: 62 mg/dL — ABNORMAL LOW (ref 70–99)
Glucose-Capillary: 74 mg/dL (ref 70–99)
Glucose-Capillary: 78 mg/dL (ref 70–99)
Glucose-Capillary: 89 mg/dL (ref 70–99)

## 2020-03-01 LAB — MAGNESIUM: Magnesium: 1.9 mg/dL (ref 1.7–2.4)

## 2020-03-01 MED ORDER — K PHOS MONO-SOD PHOS DI & MONO 155-852-130 MG PO TABS
500.0000 mg | ORAL_TABLET | Freq: Two times a day (BID) | ORAL | Status: AC
  Administered 2020-03-01 (×2): 500 mg via ORAL
  Filled 2020-03-01 (×2): qty 2

## 2020-03-01 NOTE — Progress Notes (Signed)
PROGRESS NOTE    Gregory Moody  ZJQ:[id number] DOB: 1976-09-02 DOA: 02/28/2020 PCP: [facility]   Brief Narrative: Gregory Moody is a 44 y.o. 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 difficile colitis, undergoing colonoscopy with negative biopsies, with hospitalizationbeing complicated by acute urinary retention requiring temporary Foley placement, followed by another admission from 02/06/2020 to 02/09/2020 with similar complaints and urinary retention requiring Foley catheter.  Again came with complaints of generalized weakness, lack of appetite and nausea.  Found to be in DKA.  Not using his insulin as directed since prior to discharge.  Also AKI secondary to urinary retention.  Subjective:   No new complaints.  Patient had a fall yesterday evening, stating that he just slid.  Denies any injuries.  We had another long discussion regarding staying compliance with insulin use and follow-up with his providers.  He was nodding head and stating that he will try his best.  Assessment & Plan:   Active Problems:   Hyperglycemia   DKA (diabetic ketoacidoses) ([place])  Insulin-dependent diabetes with DKA.   DKA resolved.  Uncontrolled diabetes with hyperglycemia, A1c of 10.8 in April 2021.  Multiple prior admissions with similar symptoms in DKA.  Apparently does not take insulin at home. -Patient closed his gap and was started on basal with SSI. -Consult to dietitian. -Consult to diabetic coordinator for further education.  Patient is not using his insulin as directed at home and resulted in multiple recurrent admissions with DKA.  AKI secondary to urinary retention.  Patient has an history of  prior AKI due to urinary retention, requiring Foley catheter in the past.  During prior admission he was started on Flomax which he never took. Creatinine improving after placing catheter and IV hydration. Suspecting some element of neurogenic bladder due to uncontrolled diabetes. -Urology consult. -Patient will need to be discharged on Foley. -Continue Flomax.  Hypophosphatemia.  Suspecting refeeding syndrome.  Monitor electrolytes for another day and replete as needed.  Generalized weakness Diabetic gastroparesis (HCC) Malnutrition, protein calorie, suspect severe(HCC). Patient is unable to take care of himself at home.  Per patient he did not eat anything for the past week. -Nutritionist consult. -PT evaluation-recommending home health services.  Per Education officer, museum note he was given an option of home health services through a charitable services in the past and when they contacted them they told that nobody was responding in his household and they will not be able to provide any services for him in the future due to being noncompliant. -Psych consult to see if there is any underlying reason which is a barrier to take care of himself-psych has not seen him yet.  Hyperkalemia.  Resolved. -Continue to monitor.  History of COVID-19July 2020 -No acute concerns.  Objective: Vitals:   02/29/20 1935 02/29/20 2200 03/01/20 0700 03/01/20 0800  BP:   108/81 125/84  Pulse:   70 71  Resp:   13 13  Temp: 98.1 F (36.7 C) 98.2 F (36.8 C)  98.3 F (36.8 C)  TempSrc: Oral Oral  Oral  SpO2: 100%  100% 100%  Weight:      Height:        Intake/Output Summary (Last 24 hours) at 03/01/2020 1409 Last data filed at 03/01/2020 1124 Gross per 24 hour  Intake 3143.19 ml  Output 2125 ml  Net 1018.19 ml   Filed Weights   02/28/20 2148 02/29/20 0328  Weight: 54.4 kg 46.2 kg    Examination:  General exam: Emaciated gentleman, appears calm and comfortable  Respiratory system: Clear to  auscultation. Respiratory effort normal. Cardiovascular system: S1 & S2 heard, RRR. No JVD, murmurs, rubs, gallops Gastrointestinal system: Soft, nontender, nondistended, bowel sounds positive. Central nervous system: Alert and oriented. No focal neurological deficits.Symmetric 5 x 5 power. Extremities: No edema, no cyanosis, pulses intact and symmetrical. Psychiatry: Judgement and insight appear normal.    DVT prophylaxis: Lovenox Code Status: Full Family Communication: Tried calling sister multiple times again today with no response.  Disposition Plan:  Status is: Inpatient  Remains inpatient appropriate because:Inpatient level of care appropriate due to severity of illness   Dispo: The patient is from: Home              Anticipated d/c is to: Home              Anticipated d/c date is: 1 day              Patient currently is not medically stable to d/c.  Patient with recurrent admissions with DKA and urinary retention.  Does not want to use insulin or keep Foley in at home.  Getting psych evaluation and TOC is involved for further disposition plan. Suspecting refeeding syndrome, will monitor electrolytes for 1 more day.  Consultants:   Urology  Psychiatry  Procedures:  Antimicrobials:   Data Reviewed: I have personally reviewed following labs and imaging studies  CBC: Recent Labs  Lab 02/28/20 1507 02/29/20 0508  WBC 10.8* 8.1  HGB 11.0* 8.5*  HCT 33.9* 25.0*  MCV 96.0 92.9  PLT 354 528   Basic Metabolic Panel: Recent Labs  Lab 02/28/20 1507 02/28/20 2106 02/29/20 0128 02/29/20 0508 03/01/20 1008  NA 131* 130* 130* 132* 127*  K 5.5* 4.2 3.9 3.8 3.8  CL 95* 101 101 103 100  CO2 17* 19* 21* 21* 20*  GLUCOSE 547* 280* 218* 140* 229*  BUN 79* 64* 63* 57* 31*  CREATININE 3.38* 2.35* 2.22* 1.64* 1.28*  CALCIUM 9.5 8.6* 8.6* 8.6* 7.9*  MG  --   --   --   --  1.9  PHOS  --   --   --   --  1.9*   GFR: Estimated Creatinine Clearance: 48.1 mL/min (A) (by C-G  formula based on SCr of 1.28 mg/dL (H)). Liver Function Tests: Recent Labs  Lab 02/28/20 1507 03/01/20 1008  AST 10*  --   ALT 12  --   ALKPHOS 94  --   BILITOT 1.1  --   PROT 8.0  --   ALBUMIN 3.6 2.7*   No results for input(s): LIPASE, AMYLASE in the last 168 hours. No results for input(s): AMMONIA in the last 168 hours. Coagulation Profile: No results for input(s): INR, PROTIME in the last 168 hours. Cardiac Enzymes: No results for input(s): CKTOTAL, CKMB, CKMBINDEX, TROPONINI in the last 168 hours. BNP (last 3 results) No results for input(s): PROBNP in the last 8760 hours. HbA1C: No results for input(s): HGBA1C in the last 72 hours. CBG: Recent Labs  Lab 02/29/20 1952 03/01/20 0055 03/01/20 0402 03/01/20 0730 03/01/20 1132  GLUCAP 141* 89 74 166* 141*   Lipid Profile: No results for input(s): CHOL, HDL, LDLCALC, TRIG, CHOLHDL, LDLDIRECT in the last 72 hours. Thyroid Function Tests: No results for input(s): TSH, T4TOTAL, FREET4, T3FREE,  THYROIDAB in the last 72 hours. Anemia Panel: No results for input(s): VITAMINB12, FOLATE, FERRITIN, TIBC, IRON, RETICCTPCT in the last 72 hours. Sepsis Labs: No results for input(s): PROCALCITON, LATICACIDVEN in the last 168 hours.  Recent Results (from the past 240 hour(s))  SARS Coronavirus 2 by RT PCR (hospital order, performed in [facility] hospital lab) Nasopharyngeal Nasopharyngeal Swab     Status: None   Collection Time: 02/28/20  6:18 PM   Specimen: Nasopharyngeal Swab  Result Value Ref Range Status   SARS Coronavirus 2 NEGATIVE NEGATIVE Final    Comment: (NOTE) SARS-CoV-2 target nucleic acids are NOT DETECTED. The SARS-CoV-2 RNA is generally detectable in upper and lower respiratory specimens during the acute phase of infection. The lowest concentration of SARS-CoV-2 viral copies this assay can detect is 250 copies / mL. A negative result does not preclude SARS-CoV-2 infection and should not be used as the sole  basis for treatment or other patient management decisions.  A negative result may occur with improper specimen collection / handling, submission of specimen other than nasopharyngeal swab, presence of viral mutation(s) within the areas targeted by this assay, and inadequate number of viral copies (<250 copies / mL). A negative result must be combined with clinical observations, patient history, and epidemiological information. Fact Sheet for Patients:   StrictlyIdeas.no Fact Sheet for Healthcare Providers: BankingDealers.co.za This test is not yet approved or cleared  by the [place] FDA and has been authorized for detection and/or diagnosis of SARS-CoV-2 by FDA under an Emergency Use Authorization (EUA).  This EUA will remain in effect (meaning this test can be used) for the duration of the COVID-19 declaration under Section 564(b)(1) of the Act, 21 U.S.C. section 360bbb-3(b)(1), unless the authorization is terminated or revoked sooner. Performed at [facility], [address], [place], [place] [address]      Radiology Studies: DG Chest Portable 1 View  Result Date: 02/28/2020 CLINICAL DATA:  Weakness. Body aches. Patient has history of COVID and tuberculosis infection. EXAM: PORTABLE CHEST 1 VIEW COMPARISON:  Most recent radiograph 02/07/2020. Most recent CT 12/29/2019, chest CT 04/06/2016 also reviewed FINDINGS: Chronic airspace opacity in the perifissural right upper lobe is likely post infectious scarring, and has been present over multiple prior exams. The previous nodular opacities in the left lung have improved, with mild streaky residual at the left lung base, partially obscured by overlying EKG leads. No evidence of new or acute airspace disease. Normal heart size and mediastinal contours. No pulmonary edema, pneumothorax, or large pleural effusion. Multiple overlying monitoring devices in place. IMPRESSION: 1. No acute  abnormality. 2. Chronic airspace opacity in the perifissural right upper lobe is likely related to scarring. This has been present over multiple prior exams. 3. Previous nodular opacities in the left lung have improved, with mild streaky residual at the left lung base. Electronically Signed   By: Keith Rake M.D.   On: 02/28/2020 15:47    Scheduled Meds: . Chlorhexidine Gluconate Cloth  6 each Topical Daily  . enoxaparin (LOVENOX) injection  30 mg Subcutaneous Q24H  . insulin aspart  0-5 Units Subcutaneous QHS  . insulin aspart  0-9 Units Subcutaneous TID WC  . insulin aspart protamine- aspart  30 Units Subcutaneous BID WC  . phosphorus  500 mg Oral BID  . Ensure Max Protein  11 oz Oral BID BM  . sodium chloride flush  3 mL Intravenous Q12H  . tamsulosin  0.4 mg Oral QPC breakfast   Continuous Infusions:  LOS: 2 days   Time spent: 40 minutes.  Lorella Nimrod, MD [facility]  If 7PM-7AM, please contact night-coverage [email or web address]  03/01/2020, 2:09 PM   This record has been created using Systems analyst. Errors have been sought and corrected,but may not always be located. Such creation errors do not reflect on the standard of care.

## 2020-03-01 NOTE — Progress Notes (Signed)
Took over care of patient approximately 1500.

## 2020-03-02 LAB — RENAL FUNCTION PANEL
Albumin: 2.6 g/dL — ABNORMAL LOW (ref 3.5–5.0)
Anion gap: 7 (ref 5–15)
BUN: 23 mg/dL — ABNORMAL HIGH (ref 6–20)
CO2: 22 mmol/L (ref 22–32)
Calcium: 8.4 mg/dL — ABNORMAL LOW (ref 8.9–10.3)
Chloride: 103 mmol/L (ref 98–111)
Creatinine, Ser: 1.12 mg/dL (ref 0.61–1.24)
GFR calc Af Amer: >60 mL/min (ref >60)
GFR calc non Af Amer: >60 mL/min (ref >60)
Glucose, Bld: 119 mg/dL — ABNORMAL HIGH (ref 70–99)
Phosphorus: 2.8 mg/dL (ref 2.5–4.6)
Potassium: 3.9 mmol/L (ref 3.5–5.1)
Sodium: 132 mmol/L — ABNORMAL LOW (ref 135–145)

## 2020-03-02 LAB — CBC
HCT: 25.8 % — ABNORMAL LOW (ref 39.0–52.0)
Hemoglobin: 8.6 g/dL — ABNORMAL LOW (ref 13.0–17.0)
MCH: 30.5 pg (ref 26.0–34.0)
MCHC: 33.3 g/dL (ref 30.0–36.0)
MCV: 91.5 fL (ref 80.0–100.0)
Platelets: 274 10*3/uL (ref 150–400)
RBC: 2.82 MIL/uL — ABNORMAL LOW (ref 4.22–5.81)
RDW: 13.1 % (ref 11.5–15.5)
WBC: 4.5 10*3/uL (ref 4.0–10.5)
nRBC: 0 % (ref 0.0–0.2)

## 2020-03-02 LAB — GLUCOSE, CAPILLARY
Glucose-Capillary: 186 mg/dL — ABNORMAL HIGH (ref 70–99)
Glucose-Capillary: 76 mg/dL (ref 70–99)

## 2020-03-02 LAB — MAGNESIUM: Magnesium: 1.9 mg/dL (ref 1.7–2.4)

## 2020-03-02 MED ORDER — INSULIN ASPART PROT & ASPART (70-30 MIX) 100 UNIT/ML ~~LOC~~ SUSP: 32.0000 [IU] | Freq: Two times a day (BID) | SUBCUTANEOUS | 11 refills | Status: DC

## 2020-03-02 MED ORDER — INSULIN ASPART PROT & ASPART (70-30 MIX) 100 UNIT/ML ~~LOC~~ SUSP: 32.0000 [IU] | Freq: Two times a day (BID) | SUBCUTANEOUS | Status: DC

## 2020-03-02 MED ORDER — TAMSULOSIN HCL 0.4 MG PO CAPS: 0.4000 mg | ORAL_CAPSULE | Freq: Every day | ORAL | 1 refills | Status: DC

## 2020-03-02 NOTE — Progress Notes (Signed)
Patient discharged home via [facility], with voucher provided by Education officer, museum.  Per Dr. Reesa Chew, patient discharged home with foley catheter in place, informed to follow-up with urology.  Home health ordered at discharge. Due to not having insurance, patient set up with home health agency that will be covered by hospital. Has been set up with this is the past but does not answer the phone and they are never able to contact him. I had the social worker come and personally talk to the patient about the home health process and the importance of answering his phone. She also provided him with paperwork to fill out to obtain other services for health care and public transportation to medical appointments.  Education provided to the patient and supplies given to help get patient through until home health can be established. Had patient repeat instructions back to me and return demonstration on how to care for his catheter. Instructed him that home health RN will help him call urology to set up follow up appointment. If this falls through, he can have his PCP assist him with urology follow-up. Attempted to call family to also explain his POC for home but according to patient his mother is the only person that takes care of him and she is elderly and does not speak [place].

## 2020-03-02 NOTE — TOC Progression Note (Signed)
Transition of Care [facility]) - Progression Note    Patient Details  Name: Gregory Moody MRN: [id number] Date of Birth: 04-24-1976  Transition of Care [facility]) CM/SW [place], [place] Phone Number: [phone] 03/02/2020, 12:20 PM  Clinical Narrative:     [name] w/ patient (CSW speaks Spanish) about expected d/c recommendation for home health.  The patient states he is having issues w/ transportation and lack of help and assistance from his family.  Patient states he lives w/ son and his parents, and has been told by family members that they are unable to help him "because they tell me they don't have time." Patient states he is unable to keep scheduled medical appointments because he is does not have a way to get to the doctor.  This CSW explained to patient the doctor recommended home health and due to his lack of insurance this CSW will need to contact a home health agency assigned for charity cases.  The patient verbalized understanding.  This CSW explained the RN would assist him on managing his catheter until home health could be established the patient verbalized understanding.  This CSW explained to the patient I will get him application to [facility] plus information on how to access [employer], for transportation needs.  This CSW updated RN.   Expected Discharge Plan: Home/Self Care Barriers to Discharge: Continued Medical Work up  Expected Discharge Plan and Services Expected Discharge Plan: Home/Self Care       Living arrangements for the past 2 months: Single Family Home Expected Discharge Date: 03/02/20                                     Social Determinants of Health (SDOH) Interventions    Readmission Risk Interventions Readmission Risk Prevention Plan 02/29/2020 04/19/2019 03/07/2019  Transportation Screening Complete Complete Complete  PCP or Specialist Appt within 3-5 Days - - Complete  Social Work Consult for [place]  Planning/Counseling - - (No Data)  Palliative Care Screening - - Not Applicable  Medication Review Press photographer) Complete Complete Complete  PCP or Specialist appointment within 3-5 days of discharge Complete Complete -  [place] or Home Care Consult Complete Complete -  SW Recovery Care/Counseling Consult - Complete -  Palliative Care Screening - Not Applicable -  [place] Complete Not Applicable -  Some recent data might be hidden

## 2020-03-02 NOTE — Discharge Summary (Signed)
Physician Discharge Summary  Gregory Moody ERD:[id number] DOB: 1976/07/09 DOA: 02/28/2020  PCP: [facility] date: 02/28/2020 Discharge date: 03/02/2020  Admitted From: Home Disposition: Home   Recommendations for Outpatient Follow-up:  1. Follow up with PCP in 1-2 weeks 2. Follow-up with urology 3. Please obtain BMP/CBC in one week 4. Please follow up on the following pending results:None  Home Health: Yes Equipment/Devices: Rolling walker Discharge Condition: Fair CODE STATUS: Full Diet recommendation: Heart Healthy / Carb Modified   Brief/Interim Summary: Gregory Santiago-Gonzalezis a 44 y.o.malewith medical history significant ofinsulin-dependent type 2 diabetes complicated by gastroparesis, several admissions for DKA, chronic malnutrition, history of Covid July 2020, as well as hospitalization for esophageal candidiasis, recently admitted from 4/12/2021to 01/30/2020 with chronic diarrhea and recentC. difficile colitis, undergoing colonoscopy with negative biopsies, with hospitalizationbeing complicated by acute urinary retention requiring temporary Foley placement,followed by another admission from 02/06/2020 to 02/09/2020 with similar complaints and urinary retention requiring Foley catheter.  Again came with complaints of generalized weakness, lack of appetite and nausea.  Found to be in DKA.  Not using his insulin as directed since prior to discharge.  Also AKI secondary to urinary retention.  AKI resolved with Foley catheter.  DKA initially treated with DKA protocol and then transition to his home insulin of 70/30, 30 units twice daily.  Patient is very high risk for readmission.  As he does not follow the instructions.  We tried again establishing charitable home health services and instructed him to pick up his phone.  Those services were ordered before but apparently patient and his family does not pick up on so that never worked. I tried calling his  family multiple times with no response.  Patient is being discharged with Foley catheter.  He needs to follow-up with urology as an outpatient.  He was also instructed to start taking Flomax which was given to him during prior hospitalization and he never took it.   We tried obtaining a psych consult just to see if there is any underlying cognitive issues which is a barrier for him to take care of him self.  Psych was unable to evaluate him in the past 2 to 3 days.  Patient clinically stable for discharge.  Patient also has an history of COVID-19 infection in July 2020.  No acute concerns at this time.  Discharge Diagnoses:  Active Problems:   Hyperglycemia   DKA (diabetic ketoacidoses) [facility])  Discharge Instructions  Discharge Instructions    Diet - low sodium heart healthy   Complete by: As directed    Discharge instructions   Complete by: As directed    It was pleasure taking care of you. I am increasing the dose of your insulin 70/30 to 32 units twice daily.  It is very important that you take your insulin as directed. You need to make an follow-up appointment with your primary care physician to be seen within next week. You also need to make an appointment with urologist to be seen within a week.  We are sending you home with this urine tube.  You were also started on a new medicine to help you pee better, you have not started it yet, please take your prescription from pharmacy and start using it as directed as it will help taking your catheter out.   Increase activity slowly   Complete by: As directed      Allergies as of 03/02/2020   No Known Allergies     Medication List  TAKE these medications   aspirin EC 81 MG tablet Take 81 mg by mouth daily.   colesevelam 625 MG tablet Commonly known as: WELCHOL Take 3 tablets (1,875 mg total) by mouth 2 (two) times daily with a meal.   Ensure Max Protein Liqd Take 330 mLs (11 oz total) by mouth 2 (two) times daily.    FLUoxetine 10 MG capsule Commonly known as: PROZAC Take 1 capsule (10 mg total) by mouth daily.   insulin NPH-regular Human (70-30) 100 UNIT/ML injection Inject 30 Units into the skin 2 (two) times daily with a meal.   liver oil-zinc oxide 40 % ointment Commonly known as: DESITIN Apply topically 2 (two) times daily as needed for irritation.   loperamide 2 MG capsule Commonly known as: IMODIUM Take 1 capsule (2 mg total) by mouth 2 (two) times daily as needed for diarrhea or loose stools.   multivitamin with minerals Tabs tablet Take 1 tablet by mouth daily.   sucralfate 1 g tablet Commonly known as: CARAFATE Take 1 g by mouth 4 (four) times daily.   tamsulosin 0.4 MG Caps capsule Commonly known as: FLOMAX Take 1 capsule (0.4 mg total) by mouth daily after breakfast.      Follow-up Information    [place]. Schedule an appointment as soon as possible for a visit.   Contact information: [place] [address] [phone]        Hollice Espy, MD. Schedule an appointment as soon as possible for a visit.   Specialty: Urology Contact information: [place] [phone]          No Known Allergies  Consultations:  Psychiatry  Procedures/Studies: DG Chest 2 View  Result Date: 02/07/2020 CLINICAL DATA:  Weight loss. EXAM: CHEST - 2 VIEW COMPARISON:  December 27, 2019. December 29, 2019. FINDINGS: The heart size and mediastinal contours are within normal limits. Stable nodule seen laterally in right upper lobe. Interval development of nodular opacity seen in the left upper and lower lobes which may be inflammatory in etiology. The visualized skeletal structures are unremarkable. IMPRESSION: Interval development of nodular opacities seen in left upper and lower lobes which may be inflammatory in etiology. Follow-up radiographs are recommended to ensure resolution. Electronically Signed   By:  Marijo Conception M.D.   On: 02/07/2020 15:50   DG Chest Portable 1 View  Result Date: 02/28/2020 CLINICAL DATA:  Weakness. Body aches. Patient has history of COVID and tuberculosis infection. EXAM: PORTABLE CHEST 1 VIEW COMPARISON:  Most recent radiograph 02/07/2020. Most recent CT 12/29/2019, chest CT 04/06/2016 also reviewed FINDINGS: Chronic airspace opacity in the perifissural right upper lobe is likely post infectious scarring, and has been present over multiple prior exams. The previous nodular opacities in the left lung have improved, with mild streaky residual at the left lung base, partially obscured by overlying EKG leads. No evidence of new or acute airspace disease. Normal heart size and mediastinal contours. No pulmonary edema, pneumothorax, or large pleural effusion. Multiple overlying monitoring devices in place. IMPRESSION: 1. No acute abnormality. 2. Chronic airspace opacity in the perifissural right upper lobe is likely related to scarring. This has been present over multiple prior exams. 3. Previous nodular opacities in the left lung have improved, with mild streaky residual at the left lung base. Electronically Signed   By: Keith Rake M.D.   On: 02/28/2020 15:47   US Abdomen Limited RUQ  Result Date: 02/07/2020 CLINICAL  DATA:  Chronic diarrhea EXAM: ULTRASOUND ABDOMEN LIMITED RIGHT UPPER QUADRANT COMPARISON:  CT 01/21/2020 FINDINGS: Gallbladder: Small 4 mm polyp. Gallbladder slightly contracted. Borderline wall thickening. No sonographic Percell Miller indicated. Common bile duct: Diameter: 4 mm Liver: No focal lesion identified. Within normal limits in parenchymal echogenicity. Portal vein is patent on color Doppler imaging with normal direction of blood flow towards the liver. Other: None. IMPRESSION: 1. Contracted appearing gallbladder with small polyp. No definitive sonographic features to suggest acute gallbladder disease. 2. Otherwise negative right upper quadrant abdominal ultrasound  Electronically Signed   By: Donavan Foil M.D.   On: 02/07/2020 22:21     Subjective: Patient has no new complaints today.  We discussed again in length regarding the importance of using his insulin and other medications as directed.  He noted he has had that he will do it.  Quite similar behavior during prior hospitalizations.  Social worker also talked with him regarding establishing charitable home health services and requested him to pick up his phone whenever they call.  Discharge Exam: Vitals:   03/02/20 0700 03/02/20 0725  BP: (!) 122/98   Pulse:    Resp: 17 (!) 22  Temp:  98.1 F (36.7 C)  SpO2:     Vitals:   03/02/20 0500 03/02/20 0600 03/02/20 0700 03/02/20 0725  BP: 116/87 (!) 130/91 (!) 122/98   Pulse:      Resp: 14 13 17  (!) 22  Temp:    98.1 F (36.7 C)  TempSrc:    Oral  SpO2:      Weight:      Height:        General: Pt is alert, awake, not in acute distress Cardiovascular: RRR, S1/S2 +, no rubs, no gallops Respiratory: CTA bilaterally, no wheezing, no rhonchi Abdominal: Soft, NT, ND, bowel sounds + Extremities: no edema, no cyanosis   The results of significant diagnostics from this hospitalization (including imaging, microbiology, ancillary and laboratory) are listed below for reference.    Microbiology: Recent Results (from the past 240 hour(s))  SARS Coronavirus 2 by RT PCR (hospital order, performed in [facility] hospital lab) Nasopharyngeal Nasopharyngeal Swab     Status: None   Collection Time: 02/28/20  6:18 PM   Specimen: Nasopharyngeal Swab  Result Value Ref Range Status   SARS Coronavirus 2 NEGATIVE NEGATIVE Final    Comment: (NOTE) SARS-CoV-2 target nucleic acids are NOT DETECTED. The SARS-CoV-2 RNA is generally detectable in upper and lower respiratory specimens during the acute phase of infection. The lowest concentration of SARS-CoV-2 viral copies this assay can detect is 250 copies / mL. A negative result does not preclude SARS-CoV-2  infection and should not be used as the sole basis for treatment or other patient management decisions.  A negative result may occur with improper specimen collection / handling, submission of specimen other than nasopharyngeal swab, presence of viral mutation(s) within the areas targeted by this assay, and inadequate number of viral copies (<250 copies / mL). A negative result must be combined with clinical observations, patient history, and epidemiological information. Fact Sheet for Patients:   StrictlyIdeas.no Fact Sheet for Healthcare Providers: BankingDealers.co.za This test is not yet approved or cleared  by the [place] FDA and has been authorized for detection and/or diagnosis of SARS-CoV-2 by FDA under an Emergency Use Authorization (EUA).  This EUA will remain in effect (meaning this test can be used) for the duration of the COVID-19 declaration under Section 564(b)(1) of the Act, 21 U.S.C.  section 360bbb-3(b)(1), unless the authorization is terminated or revoked sooner. Performed at [facility], [address] [place], [place] [address]      Labs: BNP (last 3 results) Recent Labs    04/19/19 0300 04/20/19 0500 04/20/19 1108  BNP 450.9* SPECIMEN(S) LOST IN TRANSIT DUPLICATE  154.0*   Basic Metabolic Panel: Recent Labs  Lab 02/28/20 2106 02/29/20 0128 02/29/20 0508 03/01/20 1008 03/02/20 0403  NA 130* 130* 132* 127* 132*  K 4.2 3.9 3.8 3.8 3.9  CL 101 101 103 100 103  CO2 19* 21* 21* 20* 22  GLUCOSE 280* 218* 140* 229* 119*  BUN 64* 63* 57* 31* 23*  CREATININE 2.35* 2.22* 1.64* 1.28* 1.12  CALCIUM 8.6* 8.6* 8.6* 7.9* 8.4*  MG  --   --   --  1.9 1.9  PHOS  --   --   --  1.9* 2.8   Liver Function Tests: Recent Labs  Lab 02/28/20 1507 03/01/20 1008 03/02/20 0403  AST 10*  --   --   ALT 12  --   --   ALKPHOS 94  --   --   BILITOT 1.1  --   --   PROT 8.0  --   --   ALBUMIN 3.6 2.7* 2.6*    No results for input(s): LIPASE, AMYLASE in the last 168 hours. No results for input(s): AMMONIA in the last 168 hours. CBC: Recent Labs  Lab 02/28/20 1507 02/29/20 0508 03/02/20 0403  WBC 10.8* 8.1 4.5  HGB 11.0* 8.5* 8.6*  HCT 33.9* 25.0* 25.8*  MCV 96.0 92.9 91.5  PLT 354 269 274   Cardiac Enzymes: No results for input(s): CKTOTAL, CKMB, CKMBINDEX, TROPONINI in the last 168 hours. BNP: Invalid input(s): POCBNP CBG: Recent Labs  Lab 03/01/20 2005 03/01/20 2026 03/01/20 2220 03/02/20 0724 03/02/20 1148  GLUCAP 62* 78 138* 186* 76   D-Dimer No results for input(s): DDIMER in the last 72 hours. Hgb A1c No results for input(s): HGBA1C in the last 72 hours. Lipid Profile No results for input(s): CHOL, HDL, LDLCALC, TRIG, CHOLHDL, LDLDIRECT in the last 72 hours. Thyroid function studies No results for input(s): TSH, T4TOTAL, T3FREE, THYROIDAB in the last 72 hours.  Invalid input(s): FREET3 Anemia work up No results for input(s): VITAMINB12, FOLATE, FERRITIN, TIBC, IRON, RETICCTPCT in the last 72 hours. Urinalysis    Component Value Date/Time   COLORURINE YELLOW (A) 02/28/2020 1515   APPEARANCEUR TURBID (A) 02/28/2020 1515   LABSPEC 1.011 02/28/2020 1515   PHURINE 5.0 02/28/2020 1515   GLUCOSEU >=500 (A) 02/28/2020 1515   HGBUR MODERATE (A) 02/28/2020 1515   BILIRUBINUR NEGATIVE 02/28/2020 1515   KETONESUR 5 (A) 02/28/2020 1515   PROTEINUR 30 (A) 02/28/2020 1515   NITRITE NEGATIVE 02/28/2020 1515   LEUKOCYTESUR LARGE (A) 02/28/2020 1515   Sepsis Labs Invalid input(s): PROCALCITONIN,  WBC,  LACTICIDVEN Microbiology Recent Results (from the past 240 hour(s))  SARS Coronavirus 2 by RT PCR (hospital order, performed in [facility] hospital lab) Nasopharyngeal Nasopharyngeal Swab     Status: None   Collection Time: 02/28/20  6:18 PM   Specimen: Nasopharyngeal Swab  Result Value Ref Range Status   SARS Coronavirus 2 NEGATIVE NEGATIVE Final    Comment:  (NOTE) SARS-CoV-2 target nucleic acids are NOT DETECTED. The SARS-CoV-2 RNA is generally detectable in upper and lower respiratory specimens during the acute phase of infection. The lowest concentration of SARS-CoV-2 viral copies this assay can detect is 250 copies / mL. A  negative result does not preclude SARS-CoV-2 infection and should not be used as the sole basis for treatment or other patient management decisions.  A negative result may occur with improper specimen collection / handling, submission of specimen other than nasopharyngeal swab, presence of viral mutation(s) within the areas targeted by this assay, and inadequate number of viral copies (<250 copies / mL). A negative result must be combined with clinical observations, patient history, and epidemiological information. Fact Sheet for Patients:   StrictlyIdeas.no Fact Sheet for Healthcare Providers: BankingDealers.co.za This test is not yet approved or cleared  by the [place] FDA and has been authorized for detection and/or diagnosis of SARS-CoV-2 by FDA under an Emergency Use Authorization (EUA).  This EUA will remain in effect (meaning this test can be used) for the duration of the COVID-19 declaration under Section 564(b)(1) of the Act, 21 U.S.C. section 360bbb-3(b)(1), unless the authorization is terminated or revoked sooner. Performed at [facility], [address] [place], [place] [address]     Time coordinating discharge: Over 30 minutes  SIGNED:  Lorella Nimrod, MD  [facility] 03/02/2020, 12:14 PM  If 7PM-7AM, please contact night-coverage [email or web address]  This record has been created using Systems analyst. Errors have been sought and corrected,but may not always be located. Such creation errors do not reflect on the standard of care.

## 2020-03-02 NOTE — Progress Notes (Signed)
Inpatient Diabetes Program Recommendations  AACE/ADA: New Consensus Statement on Inpatient Glycemic Control (2015)  Target Ranges:  Prepandial:   less than 140 mg/dL      Peak postprandial:   less than 180 mg/dL (1-2 hours)      Critically ill patients:  140 - 180 mg/dL   Lab Results  Component Value Date   GLUCAP 186 (H) 03/02/2020   HGBA1C 10.8 (H) 02/07/2020    Review of Glycemic Control Results for DALLAN, SCHONBERG (MRN [id number]) as of 03/02/2020 08:31  Ref. Range 03/01/2020 20:05 03/01/2020 20:26 03/01/2020 22:20 03/02/2020 07:24  Glucose-Capillary Latest Ref Range: 70 - 99 mg/dL 62 (L) 78 138 (H) 186 (H)   CurrDiabetes history: DM1 Outpatient Diabetes medications:  Novolin 70/30 30 units bid Current orders for Inpatient glycemic control:  Novolog sensitive tid with meals and HS Novolog 70/30 mix 30 units bid with meals  Inpatient Diabetes Program Recommendations:   Consider reducing Novolog 70/30 mix to 25 units bid.   Thanks  Adah Perl, RN, BC-ADM Inpatient Diabetes Coordinator Pager [phone]

## 2020-03-04 ENCOUNTER — Telehealth: Payer: Self-pay | Admitting: General Practice

## 2020-03-04 ENCOUNTER — Telehealth: Payer: Self-pay | Admitting: Pharmacy Technician

## 2020-03-04 NOTE — Telephone Encounter (Signed)
Provided patient with new patient packet in Spanish to obtain  [facility] services.  [employer] must receive requested financial documentation within 30 days in order to determine eligibility and provide ongoing medication assistance.  [facility]

## 2020-03-04 NOTE — Telephone Encounter (Signed)
Attempted to contact patient. Could not complete the call. The phone number on patient profile is not valid and the phone number in the inbox did not work. Tried to reach at [phone]

## 2020-03-24 ENCOUNTER — Other Ambulatory Visit: Payer: Self-pay

## 2020-03-24 ENCOUNTER — Inpatient Hospital Stay
Admit: 2020-03-24 | Discharge: 2020-03-31 | DRG: 637 | Disposition: A | Discharge status: Other Acute Inpatient Hospital | Payer: Self-pay | Attending: Internal Medicine | Admitting: Internal Medicine

## 2020-03-24 ENCOUNTER — Emergency Department: Payer: Self-pay

## 2020-03-24 DIAGNOSIS — E1142 Type 2 diabetes mellitus with diabetic polyneuropathy: Secondary | ICD-10-CM | POA: Diagnosis present

## 2020-03-24 DIAGNOSIS — Z20822 Contact with and (suspected) exposure to covid-19: Secondary | ICD-10-CM | POA: Diagnosis present

## 2020-03-24 DIAGNOSIS — F4010 Social phobia, unspecified: Secondary | ICD-10-CM | POA: Diagnosis present

## 2020-03-24 DIAGNOSIS — R64 Cachexia: Secondary | ICD-10-CM | POA: Diagnosis present

## 2020-03-24 DIAGNOSIS — E440 Moderate protein-calorie malnutrition: Secondary | ICD-10-CM | POA: Diagnosis present

## 2020-03-24 DIAGNOSIS — K529 Noninfective gastroenteritis and colitis, unspecified: Secondary | ICD-10-CM | POA: Diagnosis present

## 2020-03-24 DIAGNOSIS — E0810 Diabetes mellitus due to underlying condition with ketoacidosis without coma: Secondary | ICD-10-CM

## 2020-03-24 DIAGNOSIS — F341 Dysthymic disorder: Secondary | ICD-10-CM | POA: Diagnosis present

## 2020-03-24 DIAGNOSIS — F411 Generalized anxiety disorder: Secondary | ICD-10-CM | POA: Diagnosis present

## 2020-03-24 DIAGNOSIS — E860 Dehydration: Secondary | ICD-10-CM | POA: Diagnosis present

## 2020-03-24 DIAGNOSIS — E871 Hypo-osmolality and hyponatremia: Secondary | ICD-10-CM | POA: Diagnosis not present

## 2020-03-24 DIAGNOSIS — R571 Hypovolemic shock: Secondary | ICD-10-CM | POA: Diagnosis present

## 2020-03-24 DIAGNOSIS — Z8616 Personal history of COVID-19: Secondary | ICD-10-CM

## 2020-03-24 DIAGNOSIS — D638 Anemia in other chronic diseases classified elsewhere: Secondary | ICD-10-CM | POA: Diagnosis present

## 2020-03-24 DIAGNOSIS — N179 Acute kidney failure, unspecified: Secondary | ICD-10-CM | POA: Diagnosis present

## 2020-03-24 DIAGNOSIS — L89222 Pressure ulcer of left hip, stage 2: Secondary | ICD-10-CM | POA: Diagnosis present

## 2020-03-24 DIAGNOSIS — N319 Neuromuscular dysfunction of bladder, unspecified: Secondary | ICD-10-CM | POA: Diagnosis present

## 2020-03-24 DIAGNOSIS — R339 Retention of urine, unspecified: Secondary | ICD-10-CM | POA: Diagnosis not present

## 2020-03-24 DIAGNOSIS — I959 Hypotension, unspecified: Secondary | ICD-10-CM

## 2020-03-24 DIAGNOSIS — F332 Major depressive disorder, recurrent severe without psychotic features: Secondary | ICD-10-CM | POA: Diagnosis present

## 2020-03-24 DIAGNOSIS — E1110 Type 2 diabetes mellitus with ketoacidosis without coma: Principal | ICD-10-CM | POA: Diagnosis present

## 2020-03-24 DIAGNOSIS — Z8611 Personal history of tuberculosis: Secondary | ICD-10-CM

## 2020-03-24 DIAGNOSIS — L89322 Pressure ulcer of left buttock, stage 2: Secondary | ICD-10-CM | POA: Diagnosis present

## 2020-03-24 DIAGNOSIS — Z833 Family history of diabetes mellitus: Secondary | ICD-10-CM

## 2020-03-24 DIAGNOSIS — L89212 Pressure ulcer of right hip, stage 2: Secondary | ICD-10-CM | POA: Diagnosis present

## 2020-03-24 DIAGNOSIS — L89312 Pressure ulcer of right buttock, stage 2: Secondary | ICD-10-CM | POA: Diagnosis present

## 2020-03-24 DIAGNOSIS — K219 Gastro-esophageal reflux disease without esophagitis: Secondary | ICD-10-CM | POA: Diagnosis present

## 2020-03-24 DIAGNOSIS — Z681 Body mass index (BMI) 19 or less, adult: Secondary | ICD-10-CM

## 2020-03-24 LAB — SARS CORONAVIRUS 2 BY RT PCR (HOSPITAL ORDER, PERFORMED IN ~~LOC~~ HOSPITAL LAB): SARS Coronavirus 2: NEGATIVE

## 2020-03-24 LAB — BLOOD GAS, VENOUS
Acid-base deficit: 21 mmol/L — ABNORMAL HIGH (ref 0.0–2.0)
Acid-base deficit: 21.4 mmol/L — ABNORMAL HIGH (ref 0.0–2.0)
Bicarbonate: 5.9 mmol/L — ABNORMAL LOW (ref 20.0–28.0)
Bicarbonate: 4.7 mmol/L — ABNORMAL LOW (ref 20.0–28.0)
O2 Saturation: 82.7 %
O2 Saturation: 36.8 %
Patient temperature: 37
Patient temperature: 37
pCO2, Ven: <19 mmHg — CL (ref 44.0–60.0)
pCO2, Ven: <19 mmHg — CL (ref 44.0–60.0)
pH, Ven: 7.15 — CL (ref 7.250–7.430)
pH, Ven: 7.2 — ABNORMAL LOW (ref 7.250–7.430)
pO2, Ven: 62 mmHg — ABNORMAL HIGH (ref 32.0–45.0)
pO2, Ven: <31 mmHg — CL (ref 32.0–45.0)

## 2020-03-24 LAB — URINE DRUG SCREEN, QUALITATIVE (ARMC ONLY)
Amphetamines, Ur Screen: NOT DETECTED
Barbiturates, Ur Screen: NOT DETECTED
Benzodiazepine, Ur Scrn: NOT DETECTED
Cannabinoid 50 Ng, Ur ~~LOC~~: NOT DETECTED
Cocaine Metabolite,Ur ~~LOC~~: NOT DETECTED
MDMA (Ecstasy)Ur Screen: NOT DETECTED
Methadone Scn, Ur: NOT DETECTED
Opiate, Ur Screen: NOT DETECTED
Phencyclidine (PCP) Ur S: NOT DETECTED
Tricyclic, Ur Screen: NOT DETECTED

## 2020-03-24 LAB — PROCALCITONIN: Procalcitonin: 0.29 ng/mL

## 2020-03-24 LAB — PROTIME-INR
INR: 1.4 — ABNORMAL HIGH (ref 0.8–1.2)
Prothrombin Time: 16.5 seconds — ABNORMAL HIGH (ref 11.4–15.2)

## 2020-03-24 LAB — CBC WITH DIFFERENTIAL/PLATELET
Abs Immature Granulocytes: 0.02 10*3/uL (ref 0.00–0.07)
Abs Immature Granulocytes: 0.04 10*3/uL (ref 0.00–0.07)
Basophils Absolute: 0 10*3/uL (ref 0.0–0.1)
Basophils Absolute: 0 10*3/uL (ref 0.0–0.1)
Basophils Relative: 0 %
Basophils Relative: 1 %
Eosinophils Absolute: 0 10*3/uL (ref 0.0–0.5)
Eosinophils Absolute: 0 10*3/uL (ref 0.0–0.5)
Eosinophils Relative: 0 %
Eosinophils Relative: 1 %
HCT: 18.6 % — ABNORMAL LOW (ref 39.0–52.0)
HCT: 18.7 % — ABNORMAL LOW (ref 39.0–52.0)
Hemoglobin: 6.3 g/dL — ABNORMAL LOW (ref 13.0–17.0)
Hemoglobin: 6.1 g/dL — ABNORMAL LOW (ref 13.0–17.0)
Immature Granulocytes: 0 %
Immature Granulocytes: 1 %
Lymphocytes Relative: 15 %
Lymphocytes Relative: 16 %
Lymphs Abs: 0.7 10*3/uL (ref 0.7–4.0)
Lymphs Abs: 0.7 10*3/uL (ref 0.7–4.0)
MCH: 31 pg (ref 26.0–34.0)
MCH: 30.8 pg (ref 26.0–34.0)
MCHC: 33.9 g/dL (ref 30.0–36.0)
MCHC: 32.6 g/dL (ref 30.0–36.0)
MCV: 91.6 fL (ref 80.0–100.0)
MCV: 94.4 fL (ref 80.0–100.0)
Monocytes Absolute: 0.2 10*3/uL (ref 0.1–1.0)
Monocytes Absolute: 0.2 10*3/uL (ref 0.1–1.0)
Monocytes Relative: 4 %
Monocytes Relative: 5 %
Neutro Abs: 3.7 10*3/uL (ref 1.7–7.7)
Neutro Abs: 3.5 10*3/uL (ref 1.7–7.7)
Neutrophils Relative %: 81 %
Neutrophils Relative %: 76 %
Platelets: 229 10*3/uL (ref 150–400)
Platelets: 184 10*3/uL (ref 150–400)
RBC: 2.03 MIL/uL — ABNORMAL LOW (ref 4.22–5.81)
RBC: 1.98 MIL/uL — ABNORMAL LOW (ref 4.22–5.81)
RDW: 13.7 % (ref 11.5–15.5)
RDW: 14 % (ref 11.5–15.5)
WBC: 4.7 10*3/uL (ref 4.0–10.5)
WBC: 4.4 10*3/uL (ref 4.0–10.5)
nRBC: 0.4 % — ABNORMAL HIGH (ref 0.0–0.2)
nRBC: 0.5 % — ABNORMAL HIGH (ref 0.0–0.2)

## 2020-03-24 LAB — URINALYSIS, COMPLETE (UACMP) WITH MICROSCOPIC
Bilirubin Urine: NEGATIVE
Glucose, UA: >=500 mg/dL — AB
Ketones, ur: NEGATIVE mg/dL
Nitrite: NEGATIVE
Protein, ur: 100 mg/dL — AB
Specific Gravity, Urine: 1.01 (ref 1.005–1.030)
pH: 5 (ref 5.0–8.0)

## 2020-03-24 LAB — CREATININE, SERUM
Creatinine, Ser: 11.95 mg/dL — ABNORMAL HIGH (ref 0.61–1.24)
GFR calc Af Amer: 5 mL/min — ABNORMAL LOW (ref >60)
GFR calc non Af Amer: 5 mL/min — ABNORMAL LOW (ref >60)

## 2020-03-24 LAB — COMPREHENSIVE METABOLIC PANEL
ALT: 5 U/L (ref 0–44)
AST: <5 U/L — ABNORMAL LOW (ref 15–41)
Albumin: 2.7 g/dL — ABNORMAL LOW (ref 3.5–5.0)
Alkaline Phosphatase: 54 U/L (ref 38–126)
BUN: 162 mg/dL — ABNORMAL HIGH (ref 6–20)
CO2: <7 mmol/L — ABNORMAL LOW (ref 22–32)
Calcium: 8.1 mg/dL — ABNORMAL LOW (ref 8.9–10.3)
Chloride: 119 mmol/L — ABNORMAL HIGH (ref 98–111)
Creatinine, Ser: 12.95 mg/dL — ABNORMAL HIGH (ref 0.61–1.24)
GFR calc Af Amer: 5 mL/min — ABNORMAL LOW (ref >60)
GFR calc non Af Amer: 4 mL/min — ABNORMAL LOW (ref >60)
Glucose, Bld: 461 mg/dL — ABNORMAL HIGH (ref 70–99)
Potassium: 5.3 mmol/L — ABNORMAL HIGH (ref 3.5–5.1)
Sodium: 137 mmol/L (ref 135–145)
Total Bilirubin: 0.7 mg/dL (ref 0.3–1.2)
Total Protein: 5.5 g/dL — ABNORMAL LOW (ref 6.5–8.1)

## 2020-03-24 LAB — BASIC METABOLIC PANEL
Anion gap: 14 (ref 5–15)
BUN: 151 mg/dL — ABNORMAL HIGH (ref 6–20)
CO2: 9 mmol/L — ABNORMAL LOW (ref 22–32)
Calcium: 8.4 mg/dL — ABNORMAL LOW (ref 8.9–10.3)
Chloride: 119 mmol/L — ABNORMAL HIGH (ref 98–111)
Creatinine, Ser: 12.8 mg/dL — ABNORMAL HIGH (ref 0.61–1.24)
GFR calc Af Amer: 5 mL/min — ABNORMAL LOW (ref >60)
GFR calc non Af Amer: 4 mL/min — ABNORMAL LOW (ref >60)
Glucose, Bld: 338 mg/dL — ABNORMAL HIGH (ref 70–99)
Potassium: 4.9 mmol/L (ref 3.5–5.1)
Sodium: 142 mmol/L (ref 135–145)

## 2020-03-24 LAB — CBC
HCT: 20.6 % — ABNORMAL LOW (ref 39.0–52.0)
Hemoglobin: 6.7 g/dL — ABNORMAL LOW (ref 13.0–17.0)
MCH: 30.9 pg (ref 26.0–34.0)
MCHC: 32.5 g/dL (ref 30.0–36.0)
MCV: 94.9 fL (ref 80.0–100.0)
Platelets: 274 10*3/uL (ref 150–400)
RBC: 2.17 MIL/uL — ABNORMAL LOW (ref 4.22–5.81)
RDW: 13.9 % (ref 11.5–15.5)
WBC: 6.9 10*3/uL (ref 4.0–10.5)
nRBC: 0.4 % — ABNORMAL HIGH (ref 0.0–0.2)

## 2020-03-24 LAB — TROPONIN I (HIGH SENSITIVITY)
Troponin I (High Sensitivity): 3 ng/L (ref <18)
Troponin I (High Sensitivity): 4 ng/L (ref <18)

## 2020-03-24 LAB — ETHANOL: Alcohol, Ethyl (B): <10 mg/dL (ref <10)

## 2020-03-24 LAB — GLUCOSE, CAPILLARY
Glucose-Capillary: 407 mg/dL — ABNORMAL HIGH (ref 70–99)
Glucose-Capillary: 315 mg/dL — ABNORMAL HIGH (ref 70–99)
Glucose-Capillary: 289 mg/dL — ABNORMAL HIGH (ref 70–99)
Glucose-Capillary: 251 mg/dL — ABNORMAL HIGH (ref 70–99)
Glucose-Capillary: 231 mg/dL — ABNORMAL HIGH (ref 70–99)
Glucose-Capillary: 208 mg/dL — ABNORMAL HIGH (ref 70–99)
Glucose-Capillary: 191 mg/dL — ABNORMAL HIGH (ref 70–99)
Glucose-Capillary: 157 mg/dL — ABNORMAL HIGH (ref 70–99)
Glucose-Capillary: 186 mg/dL — ABNORMAL HIGH (ref 70–99)

## 2020-03-24 LAB — PREPARE RBC (CROSSMATCH)

## 2020-03-24 LAB — LACTIC ACID, PLASMA
Lactic Acid, Venous: 0.8 mmol/L (ref 0.5–1.9)
Lactic Acid, Venous: 1.2 mmol/L (ref 0.5–1.9)

## 2020-03-24 LAB — APTT: aPTT: 45 seconds — ABNORMAL HIGH (ref 24–36)

## 2020-03-24 MED ORDER — SODIUM CHLORIDE 0.9% FLUSH: 3.0000 mL | INTRAVENOUS | Status: DC | PRN

## 2020-03-24 MED ORDER — SODIUM CHLORIDE 0.9 % IV BOLUS
1000.0000 mL | Freq: Once | INTRAVENOUS | Status: AC
  Administered 2020-03-24: 1000 mL via INTRAVENOUS

## 2020-03-24 MED ORDER — SODIUM BICARBONATE-DEXTROSE 150-5 MEQ/L-% IV SOLN
150.0000 meq | INTRAVENOUS | Status: DC
  Administered 2020-03-24 – 2020-03-25 (×2): 150 meq via INTRAVENOUS
  Filled 2020-03-24 (×3): qty 1000

## 2020-03-24 MED ORDER — ACETAMINOPHEN 325 MG PO TABS
650.0000 mg | ORAL_TABLET | ORAL | Status: DC | PRN
  Filled 2020-03-24: qty 2

## 2020-03-24 MED ORDER — ONDANSETRON HCL 4 MG/2ML IJ SOLN
4.0000 mg | Freq: Four times a day (QID) | INTRAMUSCULAR | Status: DC | PRN
  Administered 2020-03-26 – 2020-03-31 (×6): 4 mg via INTRAVENOUS
  Filled 2020-03-24 (×6): qty 2

## 2020-03-24 MED ORDER — INSULIN REGULAR(HUMAN) IN NACL 100-0.9 UT/100ML-% IV SOLN
INTRAVENOUS | Status: DC
  Administered 2020-03-24 (×2): 2.6 [IU]/h via INTRAVENOUS
  Administered 2020-03-24: 6 [IU]/h via INTRAVENOUS
  Filled 2020-03-24: qty 100

## 2020-03-24 MED ORDER — DOCUSATE SODIUM 100 MG PO CAPS: 100.0000 mg | ORAL_CAPSULE | Freq: Two times a day (BID) | ORAL | Status: DC | PRN

## 2020-03-24 MED ORDER — LACTATED RINGERS IV BOLUS
1000.0000 mL | Freq: Once | INTRAVENOUS | Status: AC
  Administered 2020-03-24: 1000 mL via INTRAVENOUS

## 2020-03-24 MED ORDER — DEXTROSE-NACL 5-0.45 % IV SOLN: INTRAVENOUS | Status: DC

## 2020-03-24 MED ORDER — PANTOPRAZOLE SODIUM 40 MG IV SOLR
40.0000 mg | Freq: Two times a day (BID) | INTRAVENOUS | Status: DC
  Administered 2020-03-25 – 2020-03-26 (×4): 40 mg via INTRAVENOUS
  Filled 2020-03-24 (×4): qty 40

## 2020-03-24 MED ORDER — FAMOTIDINE IN NACL 20-0.9 MG/50ML-% IV SOLN
20.0000 mg | Freq: Two times a day (BID) | INTRAVENOUS | Status: DC
  Administered 2020-03-24: 20 mg via INTRAVENOUS
  Filled 2020-03-24: qty 50

## 2020-03-24 MED ORDER — NOREPINEPHRINE 4 MG/250ML-% IV SOLN
2.0000 ug/min | INTRAVENOUS | Status: DC
  Administered 2020-03-24 (×2): 2 ug/min via INTRAVENOUS
  Administered 2020-03-24: 1.013 ug/min via INTRAVENOUS
  Filled 2020-03-24: qty 250

## 2020-03-24 MED ORDER — SODIUM CHLORIDE 0.9 % IV SOLN: INTRAVENOUS | Status: DC

## 2020-03-24 MED ORDER — SODIUM CHLORIDE 0.9% FLUSH
3.0000 mL | Freq: Two times a day (BID) | INTRAVENOUS | Status: DC
  Administered 2020-03-25: 3 mL via INTRAVENOUS

## 2020-03-24 MED ORDER — DEXTROSE 50 % IV SOLN
0.0000 mL | INTRAVENOUS | Status: DC | PRN
  Administered 2020-03-26: 50 mL via INTRAVENOUS
  Filled 2020-03-24 (×2): qty 50

## 2020-03-24 MED ORDER — POLYETHYLENE GLYCOL 3350 17 G PO PACK: 17.0000 g | PACK | Freq: Every day | ORAL | Status: DC | PRN

## 2020-03-24 MED ORDER — SODIUM CHLORIDE 0.9 % IV SOLN
10.0000 mL/h | Freq: Once | INTRAVENOUS | Status: AC
  Administered 2020-03-24: 10 mL/h via INTRAVENOUS

## 2020-03-24 MED ORDER — HEPARIN SODIUM (PORCINE) 5000 UNIT/ML IJ SOLN
5000.0000 [IU] | Freq: Three times a day (TID) | INTRAMUSCULAR | Status: DC
  Administered 2020-03-24: 5000 [IU] via SUBCUTANEOUS
  Filled 2020-03-24: qty 1

## 2020-03-24 MED ORDER — SODIUM CHLORIDE 0.9 % IV SOLN: 250.0000 mL | INTRAVENOUS | Status: DC | PRN

## 2020-03-24 MED ORDER — SODIUM CHLORIDE 0.9 % IV SOLN: Freq: Once | INTRAVENOUS | Status: AC

## 2020-03-24 NOTE — ED Notes (Signed)
Levophed stopped.  Iv fluids infusing.  prbc's infusing consent signed by pt.  nsr on monitor.

## 2020-03-24 NOTE — ED Notes (Signed)
fsbs 186

## 2020-03-24 NOTE — ED Provider Notes (Signed)
[facility] Emergency Department Provider Note ____________________________________________   First MD Initiated Contact with Patient 03/24/20 1228     (approximate)  I have reviewed the triage vital signs and the nursing notes.   HISTORY  Chief Complaint Hyperglycemia and Hypotension    HPI Zebulin Siegel is a 44 y.o. male with PMH as noted below including diabetes on insulin who presents with generalized weakness, gradual onset over the last several days, and associated with inability to walk.  The patient states that he feels weak and sick, but denies focal complaints.  He states that he has not taken his insulin for the last 5 days, although cannot cite any reason why he is not taking it.  He states he is hungry.  He denies any vomiting or diarrhea, fever chills, or urinary symptoms although he still has a Foley catheter in place from his last admission several weeks ago.   Past Medical History:  Diagnosis Date  . COVID-19   . Diabetes mellitus without complication ([place])   . Gastroparesis   . Tuberculosis     Patient Active Problem List   Diagnosis Date Noted  . Hyperkalemia   . Iron deficiency anemia   . Weight loss   . Chronic diarrhea 02/07/2020  . Abnormal CT of the chest   . Acute urinary retention 02/06/2020  . Hyperglycemia due to type 2 diabetes mellitus ([place]) 02/06/2020  . History of Clostridium difficile colitis 02/06/2020  . Generalized weakness 02/06/2020  . History of COVID-19 02/06/2020  . Diarrhea 01/21/2020  . Tobacco abuse 01/21/2020  . C. difficile colitis 01/21/2020  . Urinary retention 01/21/2020  . Diabetes mellitus without complication ([place]) 73/22/0254  . Protein-calorie malnutrition, severe 01/02/2020  . Odynophagia   . DKA (diabetic ketoacidoses) ([place]) 12/27/2019  . Depression 12/27/2019  . DKA, type 2 ([place]) 12/27/2019  . Anemia of chronic disease 04/25/2019  . Hypomagnesemia 04/25/2019  . Hypotension  04/23/2019  . CAP (community acquired pneumonia) due to MSSA (methicillin sensitive Staphylococcus aureus) ([place]) 04/14/2019  . COVID-19 virus infection 04/14/2019  . Pressure injury of skin 03/31/2019  . Acute renal failure (ARF) ([place]) 03/06/2019  . CAP (community acquired pneumonia) 02/14/2019  . AKI (acute kidney injury) ([place]) 02/02/2019  . ARF (acute renal failure) ([place]) 01/03/2019  . Malnutrition of moderate degree 02/24/2018  . GERD (gastroesophageal reflux disease) 02/23/2018  . Diabetic gastroparesis ([place]) 02/23/2018  . Diabetic foot ulcer ([place]) 02/23/2018  . Aspiration pneumonia ([place]) 04/21/2017  . Hypoglycemia 04/21/2017  . Diabetes mellitus with hyperglycemia ([place]) 04/21/2017  . Hip fracture, unspecified laterality, closed, initial encounter ([place]) 04/17/2017  . Hyperglycemia 04/17/2017  . Malnutrition ([place]) 04/09/2016  . Cavitary lesion of lung 04/06/2016    Past Surgical History:  Procedure Laterality Date  . COLONOSCOPY N/A 01/25/2020   Procedure: COLONOSCOPY;  Surgeon: Toledo, Benay Pike, MD;  Location: [facility] ENDOSCOPY;  Service: Gastroenterology;  Laterality: N/A;  . ESOPHAGOGASTRODUODENOSCOPY N/A 02/03/2019   Procedure: ESOPHAGOGASTRODUODENOSCOPY (EGD);  Surgeon: Toledo, Benay Pike, MD;  Location: [facility] ENDOSCOPY;  Service: Gastroenterology;  Laterality: N/A;    Prior to Admission medications   Medication Sig Start Date End Date Taking? Authorizing Provider  aspirin EC 81 MG tablet Take 81 mg by mouth daily.    [provider]  colesevelam [facility]) 625 MG tablet Take 3 tablets (1,875 mg total) by mouth 2 (two) times daily with a meal. 02/09/20   Loletha Grayer, MD  Ensure Max Protein (ENSURE MAX PROTEIN) LIQD Take 330 mLs (  11 oz total) by mouth 2 (two) times daily. 02/09/20   Loletha Grayer, MD  FLUoxetine (PROZAC) 10 MG capsule Take 1 capsule (10 mg total) by mouth daily. 02/09/20   Loletha Grayer, MD  insulin NPH-regular Human (70-30) 100 UNIT/ML injection  Inject 30 Units into the skin 2 (two) times daily with a meal. 02/10/20   Loletha Grayer, MD  liver oil-zinc oxide (DESITIN) 40 % ointment Apply topically 2 (two) times daily as needed for irritation. 02/09/20   Loletha Grayer, MD  loperamide (IMODIUM) 2 MG capsule Take 1 capsule (2 mg total) by mouth 2 (two) times daily as needed for diarrhea or loose stools. 02/09/20   Loletha Grayer, MD  Multiple Vitamin (MULTIVITAMIN WITH MINERALS) TABS tablet Take 1 tablet by mouth daily. 02/09/20   Loletha Grayer, MD  sucralfate (CARAFATE) 1 g tablet Take 1 g by mouth 4 (four) times daily.    [provider]  tamsulosin (FLOMAX) 0.4 MG CAPS capsule Take 1 capsule (0.4 mg total) by mouth daily after breakfast. 03/02/20   Lorella Nimrod, MD    Allergies Patient has no known allergies.  Family History  Problem Relation Age of Onset  . Diabetes Mother   . Diabetes Father     Social History Social History   Tobacco Use  . Smoking status: Current Some Day Smoker    Types: Cigars  . Smokeless tobacco: Never Used  Vaping Use  . Vaping Use: Never used  Substance Use Topics  . Alcohol use: No  . Drug use: No    Review of Systems  Constitutional: Positive generalized weakness. Eyes: No redness. ENT: No sore throat. Cardiovascular: Denies chest pain. Respiratory: Denies shortness of breath. Gastrointestinal: No vomiting or diarrhea.  Genitourinary: Negative for dysuria.  Musculoskeletal: Negative for back pain. Skin: Negative for rash. Neurological: Negative for headache.   ____________________________________________   PHYSICAL EXAM:  VITAL SIGNS: ED Triage Vitals  Enc Vitals Group     BP 03/24/20 1217 (!) 94/59     Pulse Rate 03/24/20 1217 85     Resp 03/24/20 1217 16     Temp 03/24/20 1217 98.2 F (36.8 C)     Temp Source 03/24/20 1217 Oral     SpO2 03/24/20 1217 100 %     Weight 03/24/20 1222 106 lb (48.1 kg)     Height 03/24/20 1222 5\' 8"  (1.727 m)     Head  Circumference --      Peak Flow --      Pain Score 03/24/20 1222 0     Pain Loc --      Pain Edu? --      Excl. in [place]? --     Constitutional: Alert and oriented.  Frail, weak, chronically ill-appearing but in no acute distress. Eyes: Conjunctivae are normal.  EOMI. Head: Atraumatic. Nose: No congestion/rhinnorhea. Mouth/Throat: Mucous membranes are dry.   Neck: Normal range of motion.  Cardiovascular: Normal rate, regular rhythm. Grossly normal heart sounds.  Good peripheral circulation. Respiratory: Normal respiratory effort.  No retractions. Lungs CTAB. Gastrointestinal: Soft and nontender. No distention.  Genitourinary: No flank tenderness. Musculoskeletal: No lower extremity edema.  Extremities warm and well perfused.  Neurologic:  Normal speech and language. Motor intact in all extremities. Skin:  Skin is warm and dry. No rash noted. Psychiatric: Mood and affect are normal. Speech and behavior are normal.  ____________________________________________   LABS (all labs ordered are listed, but only abnormal results are displayed)  Labs Reviewed  COMPREHENSIVE  METABOLIC PANEL - Abnormal; Notable for the following components:      Result Value   Potassium 5.3 (*)    Chloride 119 (*)    CO2 <7 (*)    Glucose, Bld 461 (*)    BUN 162 (*)    Creatinine, Ser 12.95 (*)    Calcium 8.1 (*)    Total Protein 5.5 (*)    Albumin 2.7 (*)    AST <5 (*)    GFR calc non Af Amer 4 (*)    GFR calc Af Amer 5 (*)    All other components within normal limits  CBC WITH DIFFERENTIAL/PLATELET - Abnormal; Notable for the following components:   RBC 2.03 (*)    Hemoglobin 6.3 (*)    HCT 18.6 (*)    nRBC 0.4 (*)    All other components within normal limits  BLOOD GAS, VENOUS - Abnormal; Notable for the following components:   pH, Ven 7.15 (*)    pCO2, Ven <19.0 (*)    pO2, Ven 62.0 (*)    Bicarbonate 5.9 (*)    Acid-base deficit 21.0 (*)    All other components within normal limits    GLUCOSE, CAPILLARY - Abnormal; Notable for the following components:   Glucose-Capillary 407 (*)    All other components within normal limits  GLUCOSE, CAPILLARY - Abnormal; Notable for the following components:   Glucose-Capillary 315 (*)    All other components within normal limits  SARS CORONAVIRUS 2 BY RT PCR (HOSPITAL ORDER, [facility])  LACTIC ACID, PLASMA  ETHANOL  LACTIC ACID, PLASMA  URINALYSIS, COMPLETE (UACMP) WITH MICROSCOPIC  URINE DRUG SCREEN, QUALITATIVE ([facility] ONLY)  PREPARE RBC (CROSSMATCH)  TYPE AND SCREEN  TROPONIN I (HIGH SENSITIVITY)  TROPONIN I (HIGH SENSITIVITY)   ____________________________________________  EKG  ED ECG REPORT I, Arta Silence, the attending physician, personally viewed and interpreted this ECG.  Date: 03/24/2020 EKG Time: 1219 Rate: 86 Rhythm: normal sinus rhythm QRS Axis: normal Intervals: normal ST/T Wave abnormalities: Early repolarization Narrative Interpretation: no evidence of acute ischemia  ____________________________________________  RADIOLOGY  CXR: No focal infiltrate or edema  ____________________________________________   PROCEDURES  Procedure(s) performed: No  Procedures  Critical Care performed: Yes  CRITICAL CARE Performed by: Arta Silence   Total critical care time: 45 minutes  Critical care time was exclusive of separately billable procedures and treating other patients.  Critical care was necessary to treat or prevent imminent or life-threatening deterioration.  Critical care was time spent personally by me on the following activities: development of treatment plan with patient and/or surrogate as well as nursing, discussions with consultants, evaluation of patient's response to treatment, examination of patient, obtaining history from patient or surrogate, ordering and performing treatments and interventions, ordering and review of laboratory studies,  ordering and review of radiographic studies, pulse oximetry and re-evaluation of patient's condition. ____________________________________________   INITIAL IMPRESSION / ASSESSMENT AND PLAN / ED COURSE  Pertinent labs & imaging results that were available during my care of the patient were reviewed by me and considered in my medical decision making (see chart for details).  44 year old male with a history of diabetes and chronic medication noncompliance presents with generalized weakness, hypotension, and hyperglycemia.  The patient states that he has not taken his insulin for 5 days, and states he feels he is so weak he cannot walk.  I reviewed the past medical records in [facility].  The patient has numerous prior ED visits and  admissions, most recently last month for hyperglycemia with DKA.  According to prior notes, the patient has longstanding issues with medication compliance although a cause for this has not been ascertained.  The patient himself cannot cite any specific reason why he did not take his insulin other than he did not feel like it.  He also had a Foley placed when he was here last time and it is no longer hooked up to a bag but he did not follow-up to have it removed.  On exam, the patient is cachectic and weak appearing.  However he is alert and oriented x4.  His vital signs are normal except for hypotension.  He has dry mucous membranes and the abdomen is soft and nontender.The remainder of the exam is as described above.    Differential includes dehydration, DKA, other metabolic etiology, or less likely possible infection.  I have a low suspicion for CNS or cardiac cause.  We will start IV hydration, obtain labs for DKA rule out, and plan for likely readmission.  ----------------------------------------- 3:32 PM on 03/24/2020 -----------------------------------------  Lab work-up is consistent with DKA with a pH of 7.15 and an anion gap which cannot be calculated due to a low  bicarbonate.  I started the patient on an insulin drip per the Endo tool.  Lab work-up is also significant for AKI with a new creatinine of 12 and worsened anemia.  The patient has no active bleeding.  He has received 2 L of normal saline and I ordered a unit of PRBCs.  The blood pressure remains low with a map below 65, so I started the patient on peripheral Levophed.  The patient remains alert and oriented but continues to have very poor insight into his condition.  I explained to him that he has multiple life-threatening issues going on, and he understands he should not eat anything right now, but he keeps asking about getting a phone number to call for the menu for tomorrow.  Due to his hypotension requiring Levophed and the DKA, I discussed the case with Dr. Mortimer Fries for admission to the ICU.  ____________________________________________   FINAL CLINICAL IMPRESSION(S) / ED DIAGNOSES  Final diagnoses:  Diabetic ketoacidosis without coma associated with type 2 diabetes mellitus (HCC)  Hypotension, unspecified hypotension type  AKI (acute kidney injury) ([place])      NEW MEDICATIONS STARTED DURING THIS VISIT:  New Prescriptions   No medications on file     Note:  This document was prepared using Dragon voice recognition software and may include unintentional dictation errors.    Arta Silence, MD 03/24/20 1534

## 2020-03-24 NOTE — ED Notes (Signed)
fsbs 157   np in with pt for exam.

## 2020-03-24 NOTE — ED Notes (Signed)
Blood transfusion rate increased to 261ml/hr

## 2020-03-24 NOTE — Progress Notes (Signed)
Patient admitted for DKA, hypovolemic shock with acute renal failure.  I have accepted the patient from Dr. Mortimer Fries.  TR[facility] will assume care and attending role at 7 AM on 03/25/2020.

## 2020-03-24 NOTE — ED Triage Notes (Signed)
pt arrived via [facility] from home with hyperglycemia and hypotension. pt was just admitted to the icu here with the same diagnosis recently. pt nad on arrival.

## 2020-03-24 NOTE — ED Notes (Signed)
Crackers and OJ provided to pt.

## 2020-03-24 NOTE — ED Notes (Signed)
2nd unit of prbc's infusing.  Pt alert  nsr on monitor.

## 2020-03-24 NOTE — ED Notes (Signed)
Levophed started back due to BP

## 2020-03-24 NOTE — H&P (Signed)
Name: Gregory Moody MRN: [id number] DOB: Jan 29, 1976     CONSULTATION DATE: 03/24/2020  REFERRING MD : Donald Pore  CHIEF COMPLAINT: feeling bad   HISTORY OF PRESENT ILLNESS:  44 y.o. male with PMH as noted below including diabetes on insulin  presents with generalized weakness, gradual onset over the last several days, and associated with inability to walk.   -The patient states that he feels weak and sick, but denies focal complaints.  -states that he has not taken his insulin for the last 5 days,  - He denies any vomiting or diarrhea, fever chills, or urinary symptoms although he still has a Foley catheter in place from his last admission several weeks ago.  Severe acidosis on ABG, hypovolemia I have given him 6 Liters of fluid, started on pressors   Alert and awake Pressors coming down Feels better  PAST MEDICAL HISTORY :   has a past medical history of COVID-19, Diabetes mellitus without complication ([place]), Gastroparesis, and Tuberculosis.  has a past surgical history that includes Esophagogastroduodenoscopy (N/A, 02/03/2019) and Colonoscopy (N/A, 01/25/2020). Prior to Admission medications   Medication Sig Start Date End Date Taking? Authorizing Provider  aspirin EC 81 MG tablet Take 81 mg by mouth daily.    [provider]  colesevelam (WELCHOL) 625 MG tablet Take 3 tablets (1,875 mg total) by mouth 2 (two) times daily with a meal. 02/09/20   Loletha Grayer, MD  Ensure Max Protein (ENSURE MAX PROTEIN) LIQD Take 330 mLs (11 oz total) by mouth 2 (two) times daily. 02/09/20   Loletha Grayer, MD  FLUoxetine (PROZAC) 10 MG capsule Take 1 capsule (10 mg total) by mouth daily. 02/09/20   Loletha Grayer, MD  insulin NPH-regular Human (70-30) 100 UNIT/ML injection Inject 30 Units into the skin 2 (two) times daily with a meal. 02/10/20   Loletha Grayer, MD  liver oil-zinc oxide (DESITIN) 40 % ointment Apply topically 2 (two) times daily as needed for irritation. 02/09/20    Loletha Grayer, MD  loperamide (IMODIUM) 2 MG capsule Take 1 capsule (2 mg total) by mouth 2 (two) times daily as needed for diarrhea or loose stools. 02/09/20   Loletha Grayer, MD  Multiple Vitamin (MULTIVITAMIN WITH MINERALS) TABS tablet Take 1 tablet by mouth daily. 02/09/20   Loletha Grayer, MD  sucralfate (CARAFATE) 1 g tablet Take 1 g by mouth 4 (four) times daily.    [provider]  tamsulosin (FLOMAX) 0.4 MG CAPS capsule Take 1 capsule (0.4 mg total) by mouth daily after breakfast. 03/02/20   Lorella Nimrod, MD   No Known Allergies  FAMILY HISTORY:  family history includes Diabetes in his father and mother. SOCIAL HISTORY:  reports that he has been smoking cigars. He has never used smokeless tobacco. He reports that he does not drink alcohol and does not use drugs.    Review of Systems:  DVV:OHYWV better, still weak HEENT: Denies blurred vision, double vision, ear pain, eye pain, hearing loss, nose bleeds, sore throat Cardiac:  No dizziness, chest pain or heaviness, chest tightness,edema, No JVD Resp:   No cough, -sputum production, -shortness of breath,-wheezing, -hemoptysis,  Gi: Denies swallowing difficulty, stomach pain, nausea or vomiting, diarrhea, constipation, bowel incontinence Gu:  Denies bladder incontinence, burning urine Ext:   Denies Joint pain, stiffness or swelling Skin: Denies  skin rash, easy bruising or bleeding or hives Endoc:  Denies polyuria, polydipsia , polyphagia or weight change Psych:   Denies depression, insomnia or hallucinations  Other:  All other  systems negative     VITAL SIGNS: Temp:  [98.2 F (36.8 C)] 98.2 F (36.8 C) (06/14 1217) Pulse Rate:  [29-91] 29 (06/14 1501) Resp:  [9-33] 14 (06/14 1621) BP: (60-169)/(18-107) 169/107 (06/14 1600) SpO2:  [92 %-100 %] 97 % (06/14 1501) Weight:  [48.1 kg] 48.1 kg (06/14 1222)     SpO2: 97 %    Physical Examination:   GENERAL:NAD, no fevers, chills, no weakness no  fatigue HEAD: Normocephalic, atraumatic.  EYES: PERLA, EOMI No scleral icterus.  MOUTH: Moist mucosal membrane.  EAR, NOSE, THROAT: Clear without exudates. No external lesions.  NECK: Supple.  PULMONARY: CTA B/L no wheezing, rhonchi, crackles CARDIOVASCULAR: S1 and S2. Regular rate and rhythm. No murmurs GASTROINTESTINAL: Soft, nontender, nondistended. Positive bowel sounds.  MUSCULOSKELETAL: No swelling, clubbing, or edema.  NEUROLOGIC: No gross focal neurological deficits. 5/5 strength all extremities SKIN: No ulceration, lesions, rashes, or cyanosis.  PSYCHIATRIC: Insight, judgment intact. -depression -anxiety ALL OTHER ROS ARE NEGATIVE   MEDICATIONS: I have reviewed all medications and confirmed regimen as documented    CULTURE RESULTS   Recent Results (from the past 240 hour(s))  SARS Coronavirus 2 by RT PCR (hospital order, performed in [facility] hospital lab) Nasopharyngeal Nasopharyngeal Swab     Status: None   Collection Time: 03/24/20 12:26 PM   Specimen: Nasopharyngeal Swab  Result Value Ref Range Status   SARS Coronavirus 2 NEGATIVE NEGATIVE Final    Comment: (NOTE) SARS-CoV-2 target nucleic acids are NOT DETECTED.  The SARS-CoV-2 RNA is generally detectable in upper and lower respiratory specimens during the acute phase of infection. The lowest concentration of SARS-CoV-2 viral copies this assay can detect is 250 copies / mL. A negative result does not preclude SARS-CoV-2 infection and should not be used as the sole basis for treatment or other patient management decisions.  A negative result may occur with improper specimen collection / handling, submission of specimen other than nasopharyngeal swab, presence of viral mutation(s) within the areas targeted by this assay, and inadequate number of viral copies (<250 copies / mL). A negative result must be combined with clinical observations, patient history, and epidemiological information.  Fact Sheet for  Patients:   StrictlyIdeas.no  Fact Sheet for Healthcare Providers: BankingDealers.co.za  This test is not yet approved or  cleared by the [place] FDA and has been authorized for detection and/or diagnosis of SARS-CoV-2 by FDA under an Emergency Use Authorization (EUA).  This EUA will remain in effect (meaning this test can be used) for the duration of the COVID-19 declaration under Section 564(b)(1) of the Act, 21 U.S.C. section 360bbb-3(b)(1), unless the authorization is terminated or revoked sooner.  Performed at [facility], [address], [place], [place] [address]           IMAGING    DG Chest Portable 1 View  Result Date: 03/24/2020 CLINICAL DATA:  Weakness.  Hyperglycemia. EXAM: PORTABLE CHEST 1 VIEW COMPARISON:  Two-view chest x-ray 02/07/2020 FINDINGS: Heart size normal. Nodular density in right upper lobe is again seen. Lower lobe airspace disease is improving. Some residual right lower lobe airspace disease is noted. IMPRESSION: 1. Improving right lower lobe airspace disease. 2. Persistent nodular density in the right upper lobe. Electronically Signed   By: San Morelle M.D.   On: 03/24/2020 13:54       ASSESSMENT AND PLAN SYNOPSIS  DKA and Hypovolumic SHOCK with acute renal failure due to hypovolumia Continue IVF's INSULIN  Infusion  Shock-wean off pressors  Aggressive IVF's  ACUTE KIDNEY INJURY/Renal Failure -continue Foley Catheter-assess need -Avoid nephrotoxic agents -Follow urine output, BMP -Ensure adequate renal perfusion, optimize oxygenation -Renal dose medications   SD status Transfer to [facility] 6/15  Corrin Parker, M.D.  [facility]  Medical Director [place] Director [facility] Cardio-Pulmonary Department

## 2020-03-24 NOTE — ED Notes (Signed)
B;ood transfusion complete.  No reaction noted.  Pt alert.  nsr on monitor.

## 2020-03-24 NOTE — ED Notes (Signed)
fsbs 191

## 2020-03-24 NOTE — ED Notes (Signed)
Consent signed for blood transfusion

## 2020-03-24 NOTE — ED Notes (Signed)
Resumed caare from dee rn. Pt alert.  Blood sent again  Iv meds infusing.  fsbs 289.  meds adjusted.

## 2020-03-24 NOTE — ED Notes (Signed)
Blood not ready in blood bank at this time.

## 2020-03-24 NOTE — ED Notes (Signed)
Pt refusing lab draw and iv  Blood tranfusion.  Pt alert.  Iv meds infusing.

## 2020-03-24 NOTE — ED Notes (Signed)
Pt resting.  prbc's infusing.

## 2020-03-24 NOTE — ED Notes (Signed)
fsbs 231

## 2020-03-25 ENCOUNTER — Inpatient Hospital Stay: Payer: Self-pay

## 2020-03-25 DIAGNOSIS — E1110 Type 2 diabetes mellitus with ketoacidosis without coma: Principal | ICD-10-CM

## 2020-03-25 DIAGNOSIS — N179 Acute kidney failure, unspecified: Secondary | ICD-10-CM

## 2020-03-25 DIAGNOSIS — I959 Hypotension, unspecified: Secondary | ICD-10-CM

## 2020-03-25 LAB — GLUCOSE, CAPILLARY
Glucose-Capillary: 181 mg/dL — ABNORMAL HIGH (ref 70–99)
Glucose-Capillary: 177 mg/dL — ABNORMAL HIGH (ref 70–99)
Glucose-Capillary: 136 mg/dL — ABNORMAL HIGH (ref 70–99)
Glucose-Capillary: 125 mg/dL — ABNORMAL HIGH (ref 70–99)
Glucose-Capillary: 116 mg/dL — ABNORMAL HIGH (ref 70–99)
Glucose-Capillary: 119 mg/dL — ABNORMAL HIGH (ref 70–99)
Glucose-Capillary: 129 mg/dL — ABNORMAL HIGH (ref 70–99)
Glucose-Capillary: 145 mg/dL — ABNORMAL HIGH (ref 70–99)
Glucose-Capillary: 143 mg/dL — ABNORMAL HIGH (ref 70–99)
Glucose-Capillary: 41 mg/dL — CL (ref 70–99)
Glucose-Capillary: 64 mg/dL — ABNORMAL LOW (ref 70–99)
Glucose-Capillary: 108 mg/dL — ABNORMAL HIGH (ref 70–99)
Glucose-Capillary: 65 mg/dL — ABNORMAL LOW (ref 70–99)
Glucose-Capillary: 67 mg/dL — ABNORMAL LOW (ref 70–99)
Glucose-Capillary: 138 mg/dL — ABNORMAL HIGH (ref 70–99)

## 2020-03-25 LAB — TYPE AND SCREEN
ABO/RH(D): O POS
Antibody Screen: NEGATIVE
Unit division: 0
Unit division: 0

## 2020-03-25 LAB — CBC WITH DIFFERENTIAL/PLATELET
Abs Immature Granulocytes: 0.01 10*3/uL (ref 0.00–0.07)
Basophils Absolute: 0 10*3/uL (ref 0.0–0.1)
Basophils Relative: 0 %
Eosinophils Absolute: 0.1 10*3/uL (ref 0.0–0.5)
Eosinophils Relative: 1 %
HCT: 29.4 % — ABNORMAL LOW (ref 39.0–52.0)
Hemoglobin: 10.4 g/dL — ABNORMAL LOW (ref 13.0–17.0)
Immature Granulocytes: 0 %
Lymphocytes Relative: 17 %
Lymphs Abs: 1 10*3/uL (ref 0.7–4.0)
MCH: 30.7 pg (ref 26.0–34.0)
MCHC: 35.4 g/dL (ref 30.0–36.0)
MCV: 86.7 fL (ref 80.0–100.0)
Monocytes Absolute: 0.4 10*3/uL (ref 0.1–1.0)
Monocytes Relative: 7 %
Neutro Abs: 4.3 10*3/uL (ref 1.7–7.7)
Neutrophils Relative %: 75 %
Platelets: 221 10*3/uL (ref 150–400)
RBC: 3.39 MIL/uL — ABNORMAL LOW (ref 4.22–5.81)
RDW: 13.2 % (ref 11.5–15.5)
WBC: 5.8 10*3/uL (ref 4.0–10.5)
nRBC: 0.5 % — ABNORMAL HIGH (ref 0.0–0.2)

## 2020-03-25 LAB — BPAM RBC
Blood Product Expiration Date: 202107202359
Blood Product Expiration Date: 202107212359
ISSUE DATE / TIME: 202106141759
ISSUE DATE / TIME: 202106142320
Unit Type and Rh: 5100
Unit Type and Rh: 5100

## 2020-03-25 LAB — HEMOGLOBIN AND HEMATOCRIT, BLOOD
HCT: 27.2 % — ABNORMAL LOW (ref 39.0–52.0)
HCT: 26.5 % — ABNORMAL LOW (ref 39.0–52.0)
HCT: 26.5 % — ABNORMAL LOW (ref 39.0–52.0)
Hemoglobin: 9.6 g/dL — ABNORMAL LOW (ref 13.0–17.0)
Hemoglobin: 9.2 g/dL — ABNORMAL LOW (ref 13.0–17.0)
Hemoglobin: 9.4 g/dL — ABNORMAL LOW (ref 13.0–17.0)

## 2020-03-25 LAB — BLOOD GAS, VENOUS
Acid-base deficit: 15.8 mmol/L — ABNORMAL HIGH (ref 0.0–2.0)
Bicarbonate: 9.2 mmol/L — ABNORMAL LOW (ref 20.0–28.0)
O2 Saturation: 85.2 %
Patient temperature: 37
pCO2, Ven: 20 mmHg — ABNORMAL LOW (ref 44.0–60.0)
pH, Ven: 7.27 (ref 7.250–7.430)
pO2, Ven: 58 mmHg — ABNORMAL HIGH (ref 32.0–45.0)

## 2020-03-25 LAB — BASIC METABOLIC PANEL
Anion gap: 9 (ref 5–15)
Anion gap: 9 (ref 5–15)
BUN: 118 mg/dL — ABNORMAL HIGH (ref 6–20)
BUN: 113 mg/dL — ABNORMAL HIGH (ref 6–20)
CO2: 12 mmol/L — ABNORMAL LOW (ref 22–32)
CO2: 14 mmol/L — ABNORMAL LOW (ref 22–32)
Calcium: 7.8 mg/dL — ABNORMAL LOW (ref 8.9–10.3)
Calcium: 7.6 mg/dL — ABNORMAL LOW (ref 8.9–10.3)
Chloride: 119 mmol/L — ABNORMAL HIGH (ref 98–111)
Chloride: 117 mmol/L — ABNORMAL HIGH (ref 98–111)
Creatinine, Ser: 8.97 mg/dL — ABNORMAL HIGH (ref 0.61–1.24)
Creatinine, Ser: 8.99 mg/dL — ABNORMAL HIGH (ref 0.61–1.24)
GFR calc Af Amer: 7 mL/min — ABNORMAL LOW (ref >60)
GFR calc Af Amer: 7 mL/min — ABNORMAL LOW (ref >60)
GFR calc non Af Amer: 6 mL/min — ABNORMAL LOW (ref >60)
GFR calc non Af Amer: 6 mL/min — ABNORMAL LOW (ref >60)
Glucose, Bld: 144 mg/dL — ABNORMAL HIGH (ref 70–99)
Glucose, Bld: 141 mg/dL — ABNORMAL HIGH (ref 70–99)
Potassium: 4 mmol/L (ref 3.5–5.1)
Potassium: 4 mmol/L (ref 3.5–5.1)
Sodium: 140 mmol/L (ref 135–145)
Sodium: 140 mmol/L (ref 135–145)

## 2020-03-25 LAB — PROCALCITONIN: Procalcitonin: 0.18 ng/mL

## 2020-03-25 LAB — PHOSPHORUS: Phosphorus: 4.2 mg/dL (ref 2.5–4.6)

## 2020-03-25 LAB — MRSA PCR SCREENING: MRSA by PCR: NEGATIVE

## 2020-03-25 LAB — MAGNESIUM: Magnesium: 1.9 mg/dL (ref 1.7–2.4)

## 2020-03-25 IMAGING — DX DG CHEST 1V PORT
1 series · 1 of 1 positions shown · non-contrast
Comparison: Prior radiograph from [DATE].

CLINICAL DATA: Initial evaluation for interval changes.

EXAM:
PORTABLE CHEST 1 VIEW

[chest ap]
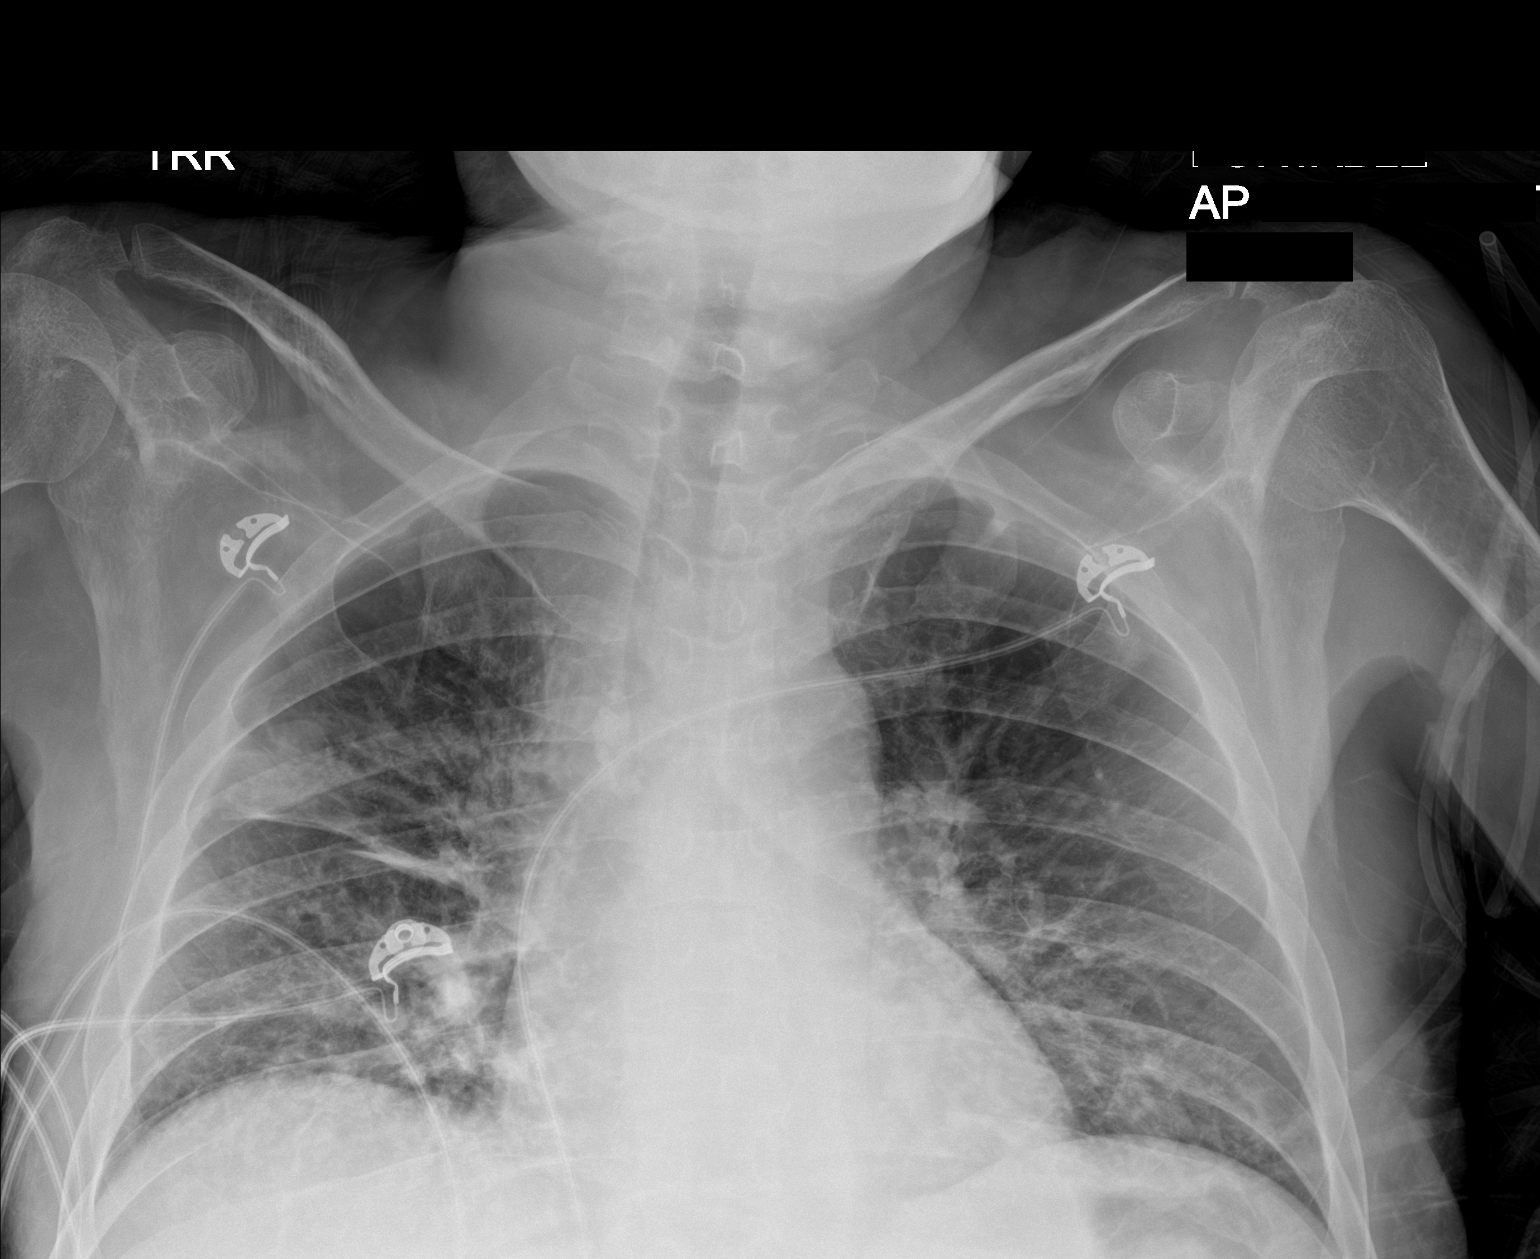

[1 of 1 positions shown; findings below may reference images not displayed]

FINDINGS: Cardiac and mediastinal silhouettes are stable, and remain within
normal limits.

Lungs are hypoinflated. Interval development of mild diffuse
pulmonary vascular and interstitial congestion, increased from
previous. No frank pulmonary edema. No pleural effusion. Linear
atelectatic changes present along the right minor fissure.
Superimposed right upper lobe nodular density, stable from previous.

Osseous structures and soft tissues are unchanged.
IMPRESSION: 1. Interval development of mild diffuse pulmonary vascular and
interstitial congestion, increased from previous. No frank pulmonary
edema.
2. Stable right upper lobe nodular density.

## 2020-03-25 MED ORDER — INSULIN ASPART 100 UNIT/ML ~~LOC~~ SOLN
3.0000 [IU] | Freq: Three times a day (TID) | SUBCUTANEOUS | Status: DC
  Administered 2020-03-25 – 2020-03-27 (×4): 3 [IU] via SUBCUTANEOUS
  Filled 2020-03-25 (×3): qty 1

## 2020-03-25 MED ORDER — INSULIN ASPART 100 UNIT/ML ~~LOC~~ SOLN
0.0000 [IU] | Freq: Three times a day (TID) | SUBCUTANEOUS | Status: DC
  Administered 2020-03-25: 1 [IU] via SUBCUTANEOUS
  Filled 2020-03-25: qty 1

## 2020-03-25 MED ORDER — CHLORHEXIDINE GLUCONATE CLOTH 2 % EX PADS
6.0000 | MEDICATED_PAD | Freq: Every day | CUTANEOUS | Status: DC
  Administered 2020-03-27 – 2020-03-28 (×2): 6 via TOPICAL
  Filled 2020-03-25: qty 6

## 2020-03-25 MED ORDER — INSULIN DETEMIR 100 UNIT/ML ~~LOC~~ SOLN
0.3000 [IU]/kg | SUBCUTANEOUS | Status: DC
  Administered 2020-03-25: 14 [IU] via SUBCUTANEOUS
  Filled 2020-03-25 (×3): qty 0.14

## 2020-03-25 MED ORDER — SODIUM CHLORIDE 0.9 % IV SOLN: INTRAVENOUS | Status: DC

## 2020-03-25 MED ORDER — DEXTROSE 50 % IV SOLN
12.5000 g | Freq: Once | INTRAVENOUS | Status: AC
  Administered 2020-03-25: 12.5 g via INTRAVENOUS

## 2020-03-25 MED ORDER — INSULIN ASPART 100 UNIT/ML ~~LOC~~ SOLN
0.0000 [IU] | SUBCUTANEOUS | Status: DC
  Administered 2020-03-26: 3 [IU] via SUBCUTANEOUS
  Filled 2020-03-25: qty 1

## 2020-03-25 MED ORDER — INSULIN ASPART 100 UNIT/ML ~~LOC~~ SOLN: 0.0000 [IU] | Freq: Every day | SUBCUTANEOUS | Status: DC

## 2020-03-25 MED ORDER — SODIUM BICARBONATE-DEXTROSE 150-5 MEQ/L-% IV SOLN
150.0000 meq | INTRAVENOUS | Status: DC
  Administered 2020-03-25: 150 meq via INTRAVENOUS

## 2020-03-25 NOTE — ED Notes (Signed)
2nd prbc's infused.  No reaction noted.

## 2020-03-25 NOTE — ED Notes (Signed)
fsbs 181

## 2020-03-25 NOTE — Progress Notes (Addendum)
CBG 40 with this check. Hypoglycemic standing orders placed and followed. Pt refuses dinner trey saying"im not hungry." Apple juice provided. Will continue to monitor.

## 2020-03-25 NOTE — ED Notes (Signed)
fsbs 177

## 2020-03-25 NOTE — ED Notes (Signed)
Pt awake  nsr on monitor.  prbc's infusing.

## 2020-03-25 NOTE — Progress Notes (Addendum)
Long acting insulin administered. Insulin gtt stopped and transitioned to ACHS insulin. Continues asking for cereal, crackers, orange juice and milk. RN reminded pt that he is here because of his elevated blood sugars and that these are carbohydrates. Pt verbalized that he was hungry. Pt received lunch but only ate 30%. Will continue to monitor.

## 2020-03-25 NOTE — Progress Notes (Signed)
Pt hasnt eaten cereal or drank milk. MD made aware. Verbal order for 1/2 amp of d50.

## 2020-03-25 NOTE — Progress Notes (Addendum)
CBG re-check is 64. Pt refuses apple juice or sprite. Only wants orange juice. RN explained that his kidney function was not good and that he needs to drink an alternative. Pt refuses to drink anything now and RN explains that not drinking or eating a carbohydrate at this time is harmful to his blood sugar and health. Pt says "I dont care, I want to die." RN paged MD and made aware of responses. Pt is refusing to give blood to lab at this time for H&H as well.

## 2020-03-25 NOTE — Progress Notes (Signed)
Pt agrees to have cereal and milk, left at bedside.

## 2020-03-25 NOTE — Progress Notes (Addendum)
Inpatient Diabetes Program Recommendations  AACE/ADA: New Consensus Statement on Inpatient Glycemic Control   Target Ranges:  Prepandial:   less than 140 mg/dL      Peak postprandial:   less than 180 mg/dL (1-2 hours)      Critically ill patients:  140 - 180 mg/dL   Results for NEEV, MCMAINS (MRN [id number]) as of 03/25/2020 07:13  Ref. Range 03/25/2020 00:29 03/25/2020 01:55 03/25/2020 03:50 03/25/2020 05:17  Glucose-Capillary Latest Ref Range: 70 - 99 mg/dL 181 (H) 177 (H) 136 (H) 125 (H)  Results for TALLIE, HEVIA (MRN [id number]) as of 03/25/2020 07:13  Ref. Range 12/27/2019 15:59 12/29/2019 00:12 02/07/2020 05:00  Hemoglobin A1C Latest Ref Range: 4.8 - 5.6 % >15.5 (H) 15.0 (H) 10.8 (H)   Review of Glycemic Control  Diabetes history:DM2 Outpatient Diabetes medications:70/30 30 units BID Current orders for Inpatient glycemic control:IV insulin; Levemir 14 units Q24H, Novolog 3 units TID with meals, Novolog 0-9 units TID with meals, Novolog 0-5 units QHS  Inpatient Diabetes Program Recommendations:  HgbA1C: A1C 10.8% on 02/07/20 indicating an average glucose over 263 mg/dl over the past 2-3 months. Patient was seen by inpatient diabetes coordinator on 02/29/20 during prior admission.   NOTE: Noted consult for diabetes coordinator. Patient well known to inpatient diabetes team and has been seen multiple times during prior admissions.  Chart reviewed. Per H&P, patient has not taken insulin x 5 days. Patient was last inpatient 02/28/20-03/02/20 and diabetes coordinator spoke with patient on 02/29/20. Per note on 02/29/20 by J. Simpson, RN, Diabetes Coordinator patient reported that his mother helped him sometimes with insulin, he does not take insulin if he is not going to eat due to concern about going low, and patient did not follow up with medication management clinic. The importance of taking insulin consistently has been stressed to patient on multiple occassions. Noted patient  has orders to transition from IV to SQ insulin.  Will continue to follow along while inpatient.  Thanks, Barnie Alderman, RN, MSN, CDE Diabetes Coordinator Inpatient Diabetes Program [phone] (Team Pager from 8am to 5pm)

## 2020-03-25 NOTE — Progress Notes (Addendum)
PROGRESS NOTE    Gregory Moody  FAO:[id number] DOB: 23-Oct-1975 DOA: 03/24/2020 PCP: [facility]   Brief Narrative:  Gregory Santiago-Gonzalezis a 44 y.o.malewith medical history significant ofinsulin-dependent type 2 diabetes complicated by gastroparesis, several admissions for DKA, chronic malnutrition, history of Covid July 2020, as well as hospitalization for esophageal candidiasis, recently admitted from 4/12/2021to 01/30/2020 with chronic diarrhea and recentC. difficile colitis, undergoing colonoscopy with negative biopsies, with hospitalizationbeing complicated by acute urinary retention requiring temporary Foley placement,followed by another admission from 02/06/2020 to 02/09/2020 with similar complaints and urinary retention requiring Foley catheter.  Other admission with DKA and AKI from 5/20 till 03/02/2020. Again came with complaints of generalized weakness, lack of appetite and nausea. Found to be in DKA. Not using his insulin as directed.   Subjective: Patient was resting comfortably when seen today.  He refused to talk.  Did not answer any questions.  Nursing complaint regarding refusing treatment.  Assessment & Plan:   Active Problems:   DKA (diabetic ketoacidoses) ([place])  Diabetes mellitus with DKA.  Off the insulin infusion now.  Patient did developed hypovolemic shock requiring pressors initially now off the pressors. -Low bicarb although improving. -Transition to basal and SSI. -Multiple similar admissions-we will try to get him charitable home health services twice which were never established as they do not pick up phone.  AKI.  Most likely secondary to uncontrolled diabetes and dehydration.  Previously his AKI responds well to Foley placement.  Patient was discharged home with Foley during prior hospitalization and advised to follow-up with urology which he never did. Some improvement in creatinine to 8.97 today. -Nephrology  consult. -Continue IV fluid. -Avoid nephrotoxins  Protein caloric malnutrition.  Patient is unable to take care of himself. -Dietitian consult.   Objective: Vitals:   03/25/20 1500 03/25/20 1600 03/25/20 1700 03/25/20 1800  BP: 94/76 (!) 85/62 93/73 109/79  Pulse: 69 66 68 73  Resp: 17 (!) 9 10 17   Temp:  97.7 F (36.5 C)    TempSrc:  Oral    SpO2: 97% 100% 100% 100%  Weight:      Height:        Intake/Output Summary (Last 24 hours) at 03/25/2020 1946 Last data filed at 03/25/2020 1822 Gross per 24 hour  Intake 1556.89 ml  Output 450 ml  Net 1106.89 ml   Filed Weights   03/24/20 1222 03/25/20 0625  Weight: 48.1 kg 56.9 kg    Examination:  General exam: Appears calm and comfortable  Respiratory system: Clear to auscultation. Respiratory effort normal. Cardiovascular system: S1 & S2 heard, RRR. No JVD, murmurs, rubs, gallops or clicks. Gastrointestinal system: Soft, nontender, nondistended, bowel sounds positive. Central nervous system: Alert and oriented. No focal neurological deficits. Extremities: No edema, no cyanosis, pulses intact and symmetrical. Psychiatry: Judgement and insight appear impaired.  DVT prophylaxis: Lovenox Code Status: Full Family Communication: No family at bedside Disposition Plan:  Status is: Inpatient  Remains inpatient appropriate because:Inpatient level of care appropriate due to severity of illness   Dispo: The patient is from: Home              Anticipated d/c is to: Home              Anticipated d/c date is: 1 day              Patient currently is not medically stable to d/c.  Patient is very high risk for readmission and deterioration.  Multiple admission with similar complaints.  Does not use his insulin or any other medication at home.  Unable to establish home health services as they does not respond to phone calls.  Has no insurance.  We tried getting psych evaluation during prior hospitalization with no  success.   Consultants:   None  Procedures:  Antimicrobials:   Data Reviewed: I have personally reviewed following labs and imaging studies  CBC: Recent Labs  Lab 03/24/20 1226 03/24/20 1226 03/24/20 1701 03/24/20 2100 03/25/20 0516 03/25/20 1223 03/25/20 1811  WBC 4.7  --  6.9 4.4 5.8  --   --   NEUTROABS 3.7  --   --  3.5 4.3  --   --   HGB 6.3*   < > 6.7* 6.1* 10.4* 9.6* 9.2*  HCT 18.6*   < > 20.6* 18.7* 29.4* 27.2* 26.5*  MCV 91.6  --  94.9 94.4 86.7  --   --   PLT 229  --  274 184 221  --   --    < > = values in this interval not displayed.   Basic Metabolic Panel: Recent Labs  Lab 03/24/20 1226 03/24/20 1539 03/24/20 1604 03/25/20 0516 03/25/20 1223  NA 137 142 143 140 140  K 5.3* 4.9 3.0* 4.0 4.0  CL 119* 119* 127* 119* 117*  CO2 <7* 9* 7* 12* 14*  GLUCOSE 461* 338* 138* 144* 141*  BUN 162* 151* 100* 118* 113*  CREATININE 12.95* 12.80* 7.58*  11.95* 8.97* 8.99*  CALCIUM 8.1* 8.4* 6.0* 7.8* 7.6*  MG  --   --   --  1.9  --   PHOS  --   --   --  4.2  --    GFR: Estimated Creatinine Clearance: 8.4 mL/min (A) (by C-G formula based on SCr of 8.99 mg/dL (H)). Liver Function Tests: Recent Labs  Lab 03/24/20 1226  AST <5*  ALT 5  ALKPHOS 54  BILITOT 0.7  PROT 5.5*  ALBUMIN 2.7*   No results for input(s): LIPASE, AMYLASE in the last 168 hours. No results for input(s): AMMONIA in the last 168 hours. Coagulation Profile: Recent Labs  Lab 03/24/20 2100  INR 1.4*   Cardiac Enzymes: No results for input(s): CKTOTAL, CKMB, CKMBINDEX, TROPONINI in the last 168 hours. BNP (last 3 results) No results for input(s): PROBNP in the last 8760 hours. HbA1C: No results for input(s): HGBA1C in the last 72 hours. CBG: Recent Labs  Lab 03/25/20 1654 03/25/20 1655 03/25/20 1718 03/25/20 1755 03/25/20 1829  GLUCAP 40* 41* 64* 65* 108*   Lipid Profile: No results for input(s): CHOL, HDL, LDLCALC, TRIG, CHOLHDL, LDLDIRECT in the last 72 hours. Thyroid  Function Tests: No results for input(s): TSH, T4TOTAL, FREET4, T3FREE, THYROIDAB in the last 72 hours. Anemia Panel: No results for input(s): VITAMINB12, FOLATE, FERRITIN, TIBC, IRON, RETICCTPCT in the last 72 hours. Sepsis Labs: Recent Labs  Lab 03/24/20 1226 03/24/20 1539 03/24/20 1604 03/25/20 0516  PROCALCITON  --  0.29  --  0.18  LATICACIDVEN 0.8  --  1.2  --     Recent Results (from the past 240 hour(s))  SARS Coronavirus 2 by RT PCR (hospital order, performed in [facility] hospital lab) Nasopharyngeal Nasopharyngeal Swab     Status: None   Collection Time: 03/24/20 12:26 PM   Specimen: Nasopharyngeal Swab  Result Value Ref Range Status   SARS Coronavirus 2 NEGATIVE NEGATIVE Final    Comment: (NOTE) SARS-CoV-2 target nucleic acids are NOT DETECTED.  The SARS-CoV-2 RNA is generally  detectable in upper and lower respiratory specimens during the acute phase of infection. The lowest concentration of SARS-CoV-2 viral copies this assay can detect is 250 copies / mL. A negative result does not preclude SARS-CoV-2 infection and should not be used as the sole basis for treatment or other patient management decisions.  A negative result may occur with improper specimen collection / handling, submission of specimen other than nasopharyngeal swab, presence of viral mutation(s) within the areas targeted by this assay, and inadequate number of viral copies (<250 copies / mL). A negative result must be combined with clinical observations, patient history, and epidemiological information.  Fact Sheet for Patients:   StrictlyIdeas.no  Fact Sheet for Healthcare Providers: BankingDealers.co.za  This test is not yet approved or  cleared by the [place] FDA and has been authorized for detection and/or diagnosis of SARS-CoV-2 by FDA under an Emergency Use Authorization (EUA).  This EUA will remain in effect (meaning this test can be  used) for the duration of the COVID-19 declaration under Section 564(b)(1) of the Act, 21 U.S.C. section 360bbb-3(b)(1), unless the authorization is terminated or revoked sooner.  Performed at [facility], [address] [place], [place] [address]   MRSA PCR Screening     Status: None   Collection Time: 03/25/20  6:14 AM   Specimen: Nasopharyngeal  Result Value Ref Range Status   MRSA by PCR NEGATIVE NEGATIVE Final    Comment:        The GeneXpert MRSA Assay (FDA approved for NASAL specimens only), is one component of a comprehensive MRSA colonization surveillance program. It is not intended to diagnose MRSA infection nor to guide or monitor treatment for MRSA infections. Performed at [facility], [address] [place], [place] [address]      Radiology Studies: US RENAL  Result Date: 03/25/2020 CLINICAL DATA:  Acute renal failure. EXAM: RENAL / URINARY TRACT ULTRASOUND COMPLETE COMPARISON:  None. FINDINGS: Right Kidney: Renal measurements: 12.5 x 5.2 x 5.7 cm = volume: 196 mL. Markedly increased parenchymal echogenicity demonstrated. No mass or hydronephrosis visualized. Mild right perinephric fluid is noted as well as mild ascites. Left Kidney: Renal measurements: 11.7 x 5.5 x 5.4 cm = volume: 182 mL. Markedly increased parenchymal echogenicity demonstrated. No mass or hydronephrosis visualized. Mild left perinephric fluid noted as well as mild ascites. Bladder: Foley catheter is seen in place. Diffuse bladder wall thickening is seen, even allowing for incomplete bladder distension. Other: None. IMPRESSION: Normal size kidneys with markedly increased parenchymal echogenicity, consistent with medical renal disease. No evidence of renal mass or hydronephrosis. Mild bilateral perinephric fluid, and mild ascites. Diffuse bladder wall thickening, with differential diagnosis including cystitis and chronic bladder outlet obstruction. Electronically Signed   By: Marlaine Hind M.D.   On: 03/25/2020 09:18   DG Chest Port 1 View  Result Date: 03/25/2020 CLINICAL DATA:  Initial evaluation for interval changes. EXAM: PORTABLE CHEST 1 VIEW COMPARISON:  Prior radiograph from 03/24/2020. FINDINGS: Cardiac and mediastinal silhouettes are stable, and remain within normal limits. Lungs are hypoinflated. Interval development of mild diffuse pulmonary vascular and interstitial congestion, increased from previous. No frank pulmonary edema. No pleural effusion. Linear atelectatic changes present along the right minor fissure. Superimposed right upper lobe nodular density, stable from previous. Osseous structures and soft tissues are unchanged. IMPRESSION: 1. Interval development of mild diffuse pulmonary vascular and interstitial congestion, increased from previous. No frank pulmonary edema. 2. Stable right upper lobe nodular density. Electronically Signed  By: Jeannine Boga M.D.   On: 03/25/2020 03:53   DG Chest Portable 1 View  Result Date: 03/24/2020 CLINICAL DATA:  Weakness.  Hyperglycemia. EXAM: PORTABLE CHEST 1 VIEW COMPARISON:  Two-view chest x-ray 02/07/2020 FINDINGS: Heart size normal. Nodular density in right upper lobe is again seen. Lower lobe airspace disease is improving. Some residual right lower lobe airspace disease is noted. IMPRESSION: 1. Improving right lower lobe airspace disease. 2. Persistent nodular density in the right upper lobe. Electronically Signed   By: San Morelle M.D.   On: 03/24/2020 13:54    Scheduled Meds: . Chlorhexidine Gluconate Cloth  6 each Topical Daily  . insulin aspart  0-5 Units Subcutaneous QHS  . insulin aspart  0-9 Units Subcutaneous TID WC  . insulin aspart  3 Units Subcutaneous TID WC  . insulin detemir  0.3 Units/kg Subcutaneous Q24H  . pantoprazole (PROTONIX) IV  40 mg Intravenous Q12H  . sodium chloride flush  3 mL Intravenous Q12H   Continuous Infusions: . sodium chloride    . norepinephrine (LEVOPHED)  Adult infusion Stopped (03/25/20 5953)  . sodium bicarbonate 150 mEq in dextrose 5% 1000 mL Stopped (03/25/20 1237)     LOS: 1 day   Time spent: 40 minutes.  Lorella Nimrod, MD [facility]  If 7PM-7AM, please contact night-coverage [email or web address]  03/25/2020, 7:46 PM   This record has been created using Systems analyst. Errors have been sought and corrected,but may not always be located. Such creation errors do not reflect on the standard of care.

## 2020-03-25 NOTE — ED Notes (Signed)
Report off to sarah rn  

## 2020-03-26 LAB — GLUCOSE, CAPILLARY
Glucose-Capillary: 88 mg/dL (ref 70–99)
Glucose-Capillary: 203 mg/dL — ABNORMAL HIGH (ref 70–99)
Glucose-Capillary: 176 mg/dL — ABNORMAL HIGH (ref 70–99)
Glucose-Capillary: 79 mg/dL (ref 70–99)
Glucose-Capillary: 47 mg/dL — ABNORMAL LOW (ref 70–99)
Glucose-Capillary: 186 mg/dL — ABNORMAL HIGH (ref 70–99)
Glucose-Capillary: 66 mg/dL — ABNORMAL LOW (ref 70–99)

## 2020-03-26 LAB — BASIC METABOLIC PANEL
Anion gap: 7 (ref 5–15)
BUN: 109 mg/dL — ABNORMAL HIGH (ref 6–20)
CO2: 18 mmol/L — ABNORMAL LOW (ref 22–32)
Calcium: 7.8 mg/dL — ABNORMAL LOW (ref 8.9–10.3)
Chloride: 112 mmol/L — ABNORMAL HIGH (ref 98–111)
Creatinine, Ser: 8.46 mg/dL — ABNORMAL HIGH (ref 0.61–1.24)
GFR calc Af Amer: 8 mL/min — ABNORMAL LOW (ref >60)
GFR calc non Af Amer: 7 mL/min — ABNORMAL LOW (ref >60)
Glucose, Bld: 169 mg/dL — ABNORMAL HIGH (ref 70–99)
Potassium: 4.7 mmol/L (ref 3.5–5.1)
Sodium: 137 mmol/L (ref 135–145)

## 2020-03-26 LAB — HEMOGLOBIN AND HEMATOCRIT, BLOOD
HCT: 28.4 % — ABNORMAL LOW (ref 39.0–52.0)
Hemoglobin: 9.8 g/dL — ABNORMAL LOW (ref 13.0–17.0)

## 2020-03-26 MED ORDER — PANTOPRAZOLE SODIUM 40 MG PO TBEC
40.0000 mg | DELAYED_RELEASE_TABLET | Freq: Two times a day (BID) | ORAL | Status: DC
  Administered 2020-03-26 – 2020-03-31 (×11): 40 mg via ORAL
  Filled 2020-03-26 (×11): qty 1

## 2020-03-26 MED ORDER — INSULIN ASPART 100 UNIT/ML ~~LOC~~ SOLN: 0.0000 [IU] | Freq: Every day | SUBCUTANEOUS | Status: DC

## 2020-03-26 MED ORDER — INSULIN DETEMIR 100 UNIT/ML ~~LOC~~ SOLN
10.0000 [IU] | Freq: Every day | SUBCUTANEOUS | Status: DC
  Administered 2020-03-26 – 2020-03-27 (×2): 10 [IU] via SUBCUTANEOUS
  Filled 2020-03-26 (×2): qty 0.1

## 2020-03-26 MED ORDER — SODIUM BICARBONATE 650 MG PO TABS
650.0000 mg | ORAL_TABLET | Freq: Two times a day (BID) | ORAL | Status: DC
  Administered 2020-03-26 – 2020-03-27 (×4): 650 mg via ORAL
  Filled 2020-03-26 (×6): qty 1

## 2020-03-26 MED ORDER — INSULIN ASPART 100 UNIT/ML ~~LOC~~ SOLN
0.0000 [IU] | Freq: Three times a day (TID) | SUBCUTANEOUS | Status: DC
  Administered 2020-03-26 – 2020-03-29 (×3): 2 [IU] via SUBCUTANEOUS
  Administered 2020-03-29: 3 [IU] via SUBCUTANEOUS
  Administered 2020-03-30 – 2020-03-31 (×2): 2 [IU] via SUBCUTANEOUS
  Administered 2020-03-31: 12:00:00 1 [IU] via SUBCUTANEOUS
  Filled 2020-03-26 (×7): qty 1

## 2020-03-26 NOTE — Progress Notes (Signed)
PROGRESS NOTE    Gregory Moody  HMC:[id number] DOB: 05-20-1976 DOA: 03/24/2020 PCP: [facility]   Brief Narrative:  Gregory Santiago-Gonzalezis a 44 y.o.malewith medical history significant ofinsulin-dependent type 2 diabetes complicated by gastroparesis, several admissions for DKA, chronic malnutrition, history of Covid July 2020, as well as hospitalization for esophageal candidiasis, recently admitted from 4/12/2021to 01/30/2020 with chronic diarrhea and recentC. difficile colitis, undergoing colonoscopy with negative biopsies, with hospitalizationbeing complicated by acute urinary retention requiring temporary Foley placement,followed by another admission from 02/06/2020 to 02/09/2020 with similar complaints and urinary retention requiring Foley catheter.  Other admission with DKA and AKI from 5/20 till 03/02/2020. Again came with complaints of generalized weakness, lack of appetite and nausea. Found to be in DKA. Not using his insulin as directed.   Subjective: Patient has no new complaints today.  According to him he was using insulin intermittently.  He does not want to check his blood glucose at home.  Assessment & Plan:   Active Problems:   DKA (diabetic ketoacidoses) (B and E)  Diabetes mellitus with DKA.  Off the insulin infusion now.  Patient did developed hypovolemic shock requiring pressors initially now off the pressors. -Low bicarb although improving. -Transition to basal and SSI. -Discontinue bicarb infusion and start him on p.o. bicarb. -Multiple similar admissions-we will try to get him charitable home health services twice which were never established as they do not pick up phone. -I consulted psych again today to see if there is any underlying cognitive issues and he is unable to take care of himself.  AKI.  Most likely secondary to uncontrolled diabetes and dehydration.  Previously his AKI responds well to Foley placement.  Patient was discharged  home with Foley during prior hospitalization and advised to follow-up with urology which he never did. Some improvement in creatinine to 8.46 today. -Nephrology consult. -Continue IV fluid. -Avoid nephrotoxins  Protein caloric malnutrition.  Patient is unable to take care of himself. -Dietitian consult.   Objective: Vitals:   03/26/20 1100 03/26/20 1200 03/26/20 1300 03/26/20 1400  BP: 105/80 113/85 109/88 106/82  Pulse:      Resp: 11 (!) 9 15 15   Temp:  98.5 F (36.9 C)    TempSrc:  Oral    SpO2:  95%    Weight:      Height:        Intake/Output Summary (Last 24 hours) at 03/26/2020 1511 Last data filed at 03/26/2020 1300 Gross per 24 hour  Intake 723.16 ml  Output 975 ml  Net -251.84 ml   Filed Weights   03/24/20 1222 03/25/20 0625 03/26/20 0500  Weight: 48.1 kg 56.9 kg 60.1 kg    Examination:  General exam: Chronically ill-appearing, emaciated gentleman,appears calm and comfortable  Respiratory system: Clear to auscultation. Respiratory effort normal. Cardiovascular system: S1 & S2 heard, RRR. No JVD, murmurs, rubs, gallops or clicks. Gastrointestinal system: Soft, nontender, nondistended, bowel sounds positive. Central nervous system: Alert and oriented. No focal neurological deficits. Extremities: No edema, no cyanosis, pulses intact and symmetrical. Psychiatry: Judgement and insight appear normal.  DVT prophylaxis: Lovenox Code Status: Full Family Communication: No family at bedside Disposition Plan:  Status is: Inpatient  Remains inpatient appropriate because:Inpatient level of care appropriate due to severity of illness   Dispo: The patient is from: Home              Anticipated d/c is to: Home              Anticipated d/c  date is: 1 day              Patient currently is not medically stable to d/c.  Patient is very high risk for readmission and deterioration.  Multiple admission with similar complaints.  Does not use his insulin or any other medication  at home.  Unable to establish home health services as they does not respond to phone calls.  Has no insurance.  I consulted psych again today-we will appreciate their recommendations. Consulted nephrology as renal functions remained worse.  Consultants:   None  Procedures:  Antimicrobials:   Data Reviewed: I have personally reviewed following labs and imaging studies  CBC: Recent Labs  Lab 03/24/20 1226 03/24/20 1226 03/24/20 1701 03/24/20 1701 03/24/20 2100 03/24/20 2100 03/25/20 0516 03/25/20 1223 03/25/20 1811 03/25/20 2328 03/26/20 0530  WBC 4.7  --  6.9  --  4.4  --  5.8  --   --   --   --   NEUTROABS 3.7  --   --   --  3.5  --  4.3  --   --   --   --   HGB 6.3*   < > 6.7*   < > 6.1*   < > 10.4* 9.6* 9.2* 9.4* 9.8*  HCT 18.6*   < > 20.6*   < > 18.7*   < > 29.4* 27.2* 26.5* 26.5* 28.4*  MCV 91.6  --  94.9  --  94.4  --  86.7  --   --   --   --   PLT 229  --  274  --  184  --  221  --   --   --   --    < > = values in this interval not displayed.   Basic Metabolic Panel: Recent Labs  Lab 03/24/20 1539 03/24/20 1604 03/25/20 0516 03/25/20 1223 03/26/20 0530  NA 142 143 140 140 137  K 4.9 3.0* 4.0 4.0 4.7  CL 119* 127* 119* 117* 112*  CO2 9* 7* 12* 14* 18*  GLUCOSE 338* 138* 144* 141* 169*  BUN 151* 100* 118* 113* 109*  CREATININE 12.80* 7.58*   11.95* 8.97* 8.99* 8.46*  CALCIUM 8.4* 6.0* 7.8* 7.6* 7.8*  MG  --   --  1.9  --   --   PHOS  --   --  4.2  --   --    GFR: Estimated Creatinine Clearance: 9.5 mL/min (A) (by C-G formula based on SCr of 8.46 mg/dL (H)). Liver Function Tests: Recent Labs  Lab 03/24/20 1226  AST <5*  ALT 5  ALKPHOS 54  BILITOT 0.7  PROT 5.5*  ALBUMIN 2.7*   No results for input(s): LIPASE, AMYLASE in the last 168 hours. No results for input(s): AMMONIA in the last 168 hours. Coagulation Profile: Recent Labs  Lab 03/24/20 2100  INR 1.4*   Cardiac Enzymes: No results for input(s): CKTOTAL, CKMB, CKMBINDEX, TROPONINI in  the last 168 hours. BNP (last 3 results) No results for input(s): PROBNP in the last 8760 hours. HbA1C: No results for input(s): HGBA1C in the last 72 hours. CBG: Recent Labs  Lab 03/25/20 2104 03/25/20 2225 03/26/20 0016 03/26/20 0742 03/26/20 1143  GLUCAP 67* 138* 88 203* 176*   Lipid Profile: No results for input(s): CHOL, HDL, LDLCALC, TRIG, CHOLHDL, LDLDIRECT in the last 72 hours. Thyroid Function Tests: No results for input(s): TSH, T4TOTAL, FREET4, T3FREE, THYROIDAB in the last 72 hours. Anemia Panel: No results for input(s):  VITAMINB12, FOLATE, FERRITIN, TIBC, IRON, RETICCTPCT in the last 72 hours. Sepsis Labs: Recent Labs  Lab 03/24/20 1226 03/24/20 1539 03/24/20 1604 03/25/20 0516  PROCALCITON  --  0.29  --  0.18  LATICACIDVEN 0.8  --  1.2  --     Recent Results (from the past 240 hour(s))  SARS Coronavirus 2 by RT PCR (hospital order, performed in [facility] hospital lab) Nasopharyngeal Nasopharyngeal Swab     Status: None   Collection Time: 03/24/20 12:26 PM   Specimen: Nasopharyngeal Swab  Result Value Ref Range Status   SARS Coronavirus 2 NEGATIVE NEGATIVE Final    Comment: (NOTE) SARS-CoV-2 target nucleic acids are NOT DETECTED.  The SARS-CoV-2 RNA is generally detectable in upper and lower respiratory specimens during the acute phase of infection. The lowest concentration of SARS-CoV-2 viral copies this assay can detect is 250 copies / mL. A negative result does not preclude SARS-CoV-2 infection and should not be used as the sole basis for treatment or other patient management decisions.  A negative result may occur with improper specimen collection / handling, submission of specimen other than nasopharyngeal swab, presence of viral mutation(s) within the areas targeted by this assay, and inadequate number of viral copies (<250 copies / mL). A negative result must be combined with clinical observations, patient history, and epidemiological  information.  Fact Sheet for Patients:   StrictlyIdeas.no  Fact Sheet for Healthcare Providers: BankingDealers.co.za  This test is not yet approved or  cleared by the [place] FDA and has been authorized for detection and/or diagnosis of SARS-CoV-2 by FDA under an Emergency Use Authorization (EUA).  This EUA will remain in effect (meaning this test can be used) for the duration of the COVID-19 declaration under Section 564(b)(1) of the Act, 21 U.S.C. section 360bbb-3(b)(1), unless the authorization is terminated or revoked sooner.  Performed at [facility], [address] [place], [place] [address]   MRSA PCR Screening     Status: None   Collection Time: 03/25/20  6:14 AM   Specimen: Nasopharyngeal  Result Value Ref Range Status   MRSA by PCR NEGATIVE NEGATIVE Final    Comment:        The GeneXpert MRSA Assay (FDA approved for NASAL specimens only), is one component of a comprehensive MRSA colonization surveillance program. It is not intended to diagnose MRSA infection nor to guide or monitor treatment for MRSA infections. Performed at [facility], [address], [place], [place] [address]      Radiology Studies: US RENAL  Result Date: 03/25/2020 CLINICAL DATA:  Acute renal failure. EXAM: RENAL / URINARY TRACT ULTRASOUND COMPLETE COMPARISON:  None. FINDINGS: Right Kidney: Renal measurements: 12.5 x 5.2 x 5.7 cm = volume: 196 mL. Markedly increased parenchymal echogenicity demonstrated. No mass or hydronephrosis visualized. Mild right perinephric fluid is noted as well as mild ascites. Left Kidney: Renal measurements: 11.7 x 5.5 x 5.4 cm = volume: 182 mL. Markedly increased parenchymal echogenicity demonstrated. No mass or hydronephrosis visualized. Mild left perinephric fluid noted as well as mild ascites. Bladder: Foley catheter is seen in place. Diffuse bladder wall thickening is seen, even allowing  for incomplete bladder distension. Other: None. IMPRESSION: Normal size kidneys with markedly increased parenchymal echogenicity, consistent with medical renal disease. No evidence of renal mass or hydronephrosis. Mild bilateral perinephric fluid, and mild ascites. Diffuse bladder wall thickening, with differential diagnosis including cystitis and chronic bladder outlet obstruction. Electronically Signed   By: Myles Rosenthal.D.  On: 03/25/2020 09:18   DG Chest Port 1 View  Result Date: 03/25/2020 CLINICAL DATA:  Initial evaluation for interval changes. EXAM: PORTABLE CHEST 1 VIEW COMPARISON:  Prior radiograph from 03/24/2020. FINDINGS: Cardiac and mediastinal silhouettes are stable, and remain within normal limits. Lungs are hypoinflated. Interval development of mild diffuse pulmonary vascular and interstitial congestion, increased from previous. No frank pulmonary edema. No pleural effusion. Linear atelectatic changes present along the right minor fissure. Superimposed right upper lobe nodular density, stable from previous. Osseous structures and soft tissues are unchanged. IMPRESSION: 1. Interval development of mild diffuse pulmonary vascular and interstitial congestion, increased from previous. No frank pulmonary edema. 2. Stable right upper lobe nodular density. Electronically Signed   By: Jeannine Boga M.D.   On: 03/25/2020 03:53    Scheduled Meds:  Chlorhexidine Gluconate Cloth  6 each Topical Daily   insulin aspart  0-5 Units Subcutaneous QHS   insulin aspart  0-9 Units Subcutaneous TID WC   insulin aspart  3 Units Subcutaneous TID WC   insulin detemir  10 Units Subcutaneous Daily   pantoprazole  40 mg Oral BID   sodium bicarbonate  650 mg Oral BID   Continuous Infusions:  sodium chloride Stopped (03/25/20 2300)     LOS: 2 days   Time spent: 40 minutes.  Lorella Nimrod, MD [facility]  If 7PM-7AM, please contact night-coverage [email or web address]  03/26/2020, 3:11  PM   This record has been created using Systems analyst. Errors have been sought and corrected,but may not always be located. Such creation errors do not reflect on the standard of care.

## 2020-03-26 NOTE — Progress Notes (Signed)
Pt transferred to room 101 report given to [facility].

## 2020-03-26 NOTE — Progress Notes (Signed)
Inpatient Diabetes Program Recommendations  AACE/ADA: New Consensus Statement on Inpatient Glycemic Control   Target Ranges:  Prepandial:   less than 140 mg/dL      Peak postprandial:   less than 180 mg/dL (1-2 hours)      Critically ill patients:  140 - 180 mg/dL  Results for Gregory Moody, Gregory Moody (MRN [id number]) as of 03/26/2020 09:03  Ref. Range 03/26/2020 00:16 03/26/2020 07:42  Glucose-Capillary Latest Ref Range: 70 - 99 mg/dL 88 203 (H)   Results for Gregory Moody, Gregory Moody (MRN [id number]) as of 03/26/2020 09:03  Ref. Range 03/25/2020 06:00 03/25/2020 08:10 03/25/2020 09:13 03/25/2020 10:59 03/25/2020 11:58 03/25/2020 16:54 03/25/2020 16:55 03/25/2020 17:18 03/25/2020 17:55 03/25/2020 18:29 03/25/2020 21:04 03/25/2020 22:25  Glucose-Capillary Latest Ref Range: 70 - 99 mg/dL 129 (H) 116 (H) 119 (H)  Levemir 14 units 143 (H) 145 (H)  Novolog 4 units 40 (LL) 41 (LL) 64 (L) 65 (L) 108 (H) 67 (L) 138 (H)   Review of Glycemic Control  Diabetes history:DM2 Outpatient Diabetes medications:70/30 30 units BID Current orders for Inpatient glycemic control:Levemir 14 units Q24H, Novolog 3 units TID with meals, Novolog 0-9 units Q4H Inpatient Diabetes Program Recommendations:   Insulin - Basal: Please consider decreasing Levemir to 10 units daily.  Insulin-correction: If patient is eating well, please consider changing CBGs to ACHS and Novolog to 0-9 units TID with meals and Novolog 0-5 units QHS.  NURSING: Please be sure patient eats at least 50% of meals before giving meal coverage.  Thanks, Barnie Alderman, RN, MSN, CDE Diabetes Coordinator Inpatient Diabetes Program [phone] (Team Pager from 8am to 5pm)

## 2020-03-26 NOTE — Progress Notes (Signed)
RNCM received consult, attempted to assess patient however he refused to speak. Patient has been set up with services during past admissions and then will not accept once discharged. RNCM will remain available for any needs.

## 2020-03-26 NOTE — Progress Notes (Signed)
Upon assessment patient is withdrawn but calm.  I inquired about his living situation and he replied that he lives here but would not specify where "here" denotes.  Further questioning reveals he is from [place] and came here because his mother and father and other family live here but they kicked him out of their house because he drinks excessively.  He lives outside on the streets.  He has injuries consistent with his life style I.e. ulcerations to the bilateral arms legs and elbows.  His wounds have been treated but patient is non-compliant with thriving needs and presents with failure to thrive and failure to perform daily cares.  He has a foul smell about him but upon requesting to bath him twice, patient refuses.

## 2020-03-26 NOTE — Consult Note (Signed)
[address] [place], [place] [address] Phone [phone]. Fax [phone]  Date: 03/26/2020                  Patient Name:  Gregory Moody  MRN: [id number]  DOB: [birth year]  Age / Sex: 44 y.o., male         PCP: [facility]                 Service Requesting Consult: IM/ Lorella Nimrod, MD                 Reason for Consult: ARF            History of Present Illness: Patient is a 45 y.o. 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 difficile colitis, history of urinary retention, who was admitted to [facility] on 03/24/2020 for evaluation of generalized weakness, lack of appetite, nausea.  He was diagnosed with diabetic acid acidosis and admitted to ICU for further management  Nephrology consult has been requested for evaluation and management of acute kidney injury.  Patient has a baseline creatinine of 1.12 from Mar 02, 2020. Admission creatinine of 4.9 on June 14.  Since then creatinine has been steadily improving and is down to 8.5 today Patient reports that he has been really weak 5 to 6 days prior to admission and had not had any food intake 2 to 3 days prior to admission. Overall now he feels better.  He is able to eat some.  He had vomiting prior to admission but not now. He has chronic diarrhea He also has a history of TB in the past  Medications: Outpatient medications: Medications Prior to Admission  Medication Sig Dispense Refill Last Dose  . aspirin EC 81 MG tablet Take 81 mg by mouth daily.     . colesevelam (WELCHOL) 625 MG tablet Take 3 tablets (1,875 mg total) by mouth 2 (two) times daily with a meal. 180 tablet 0 Past Week at Unknown time  . Ensure Max Protein (ENSURE MAX PROTEIN) LIQD Take  330 mLs (11 oz total) by mouth 2 (two) times daily. 330 mL 0   . FLUoxetine (PROZAC) 10 MG capsule Take 1 capsule (10 mg total) by mouth daily. 30 capsule 0 Past Week at Unknown time  . insulin NPH-regular Human (70-30) 100 UNIT/ML injection Inject 30 Units into the skin 2 (two) times daily with a meal. 10 mL 11 Past Week at Unknown time  . liver oil-zinc oxide (DESITIN) 40 % ointment Apply topically 2 (two) times daily as needed for irritation. 56.7 g 0 Unknown at PRN  . loperamide (IMODIUM) 2 MG capsule Take 1 capsule (2 mg total) by mouth 2 (two) times daily as needed for diarrhea or loose stools. 40 capsule 0 Unknown at PRN  . Multiple Vitamin (MULTIVITAMIN WITH MINERALS) TABS tablet Take 1 tablet by mouth daily. 30 tablet 0   . sucralfate (CARAFATE) 1 g tablet Take 1 g by mouth 4 (four) times daily.     . tamsulosin (FLOMAX) 0.4 MG CAPS capsule Take 1 capsule (0.4 mg total) by mouth daily after breakfast. 30 capsule 1 Past Week at Unknown time    Current medications: Current Facility-Administered Medications  Medication Dose Route Frequency Provider Last Rate Last Admin  . 0.9 %  sodium chloride infusion  Intravenous Continuous Lorella Nimrod, MD   Held at 03/25/20 2300  . acetaminophen (TYLENOL) tablet 650 mg  650 mg Oral Q4H PRN Flora Lipps, MD      . Chlorhexidine Gluconate Cloth 2 % PADS 6 each  6 each Topical Daily Kasa, Kurian, MD      . dextrose 50 % solution 0-50 mL  0-50 mL Intravenous PRN Arta Silence, MD      . docusate sodium (COLACE) capsule 100 mg  100 mg Oral BID PRN Flora Lipps, MD      . insulin aspart (novoLOG) injection 0-9 Units  0-9 Units Subcutaneous Q4H Lang Snow, NP   3 Units at 03/26/20 0815  . insulin aspart (novoLOG) injection 3 Units  3 Units Subcutaneous TID WC Awilda Bill, NP   3 Units at 03/26/20 0816  . insulin detemir (LEVEMIR) injection 14 Units  0.3 Units/kg Subcutaneous Q24H Awilda Bill, NP   14 Units at 03/25/20 0943  .  ondansetron (ZOFRAN) injection 4 mg  4 mg Intravenous Q6H PRN Flora Lipps, MD      . pantoprazole (PROTONIX) injection 40 mg  40 mg Intravenous Q12H Awilda Bill, NP   40 mg at 03/25/20 2106  . polyethylene glycol (MIRALAX / GLYCOLAX) packet 17 g  17 g Oral Daily PRN Flora Lipps, MD      . sodium bicarbonate 150 mEq in dextrose 5% 1000 mL infusion  150 mEq Intravenous Continuous Lang Snow, NP 75 mL/hr at 03/25/20 2330 150 mEq at 03/25/20 2330      Allergies: No Known Allergies    Past Medical History: Past Medical History:  Diagnosis Date  . COVID-19   . Diabetes mellitus without complication ([place])   . Gastroparesis   . Tuberculosis      Past Surgical History: Past Surgical History:  Procedure Laterality Date  . COLONOSCOPY N/A 01/25/2020   Procedure: COLONOSCOPY;  Surgeon: Toledo, Benay Pike, MD;  Location: [facility] ENDOSCOPY;  Service: Gastroenterology;  Laterality: N/A;  . ESOPHAGOGASTRODUODENOSCOPY N/A 02/03/2019   Procedure: ESOPHAGOGASTRODUODENOSCOPY (EGD);  Surgeon: Toledo, Benay Pike, MD;  Location: [facility] ENDOSCOPY;  Service: Gastroenterology;  Laterality: N/A;     Family History: Family History  Problem Relation Age of Onset  . Diabetes Mother   . Diabetes Father      Social History: Social History   Socioeconomic History  . Marital status: Single    Spouse name: Not on file  . Number of children: 3  . Years of education: Not on file  . Highest education level: Not on file  Occupational History  . Occupation: unemployed   Tobacco Use  . Smoking status: Current Some Day Smoker    Types: Cigars  . Smokeless tobacco: Never Used  Vaping Use  . Vaping Use: Never used  Substance and Sexual Activity  . Alcohol use: No  . Drug use: No  . Sexual activity: Not on file  Other Topics Concern  . Not on file  Social History Narrative   Resides with daughter only weekends    Social Determinants of Health   Financial Resource Strain: Low Risk    . Difficulty of Paying Living Expenses: Not hard at all  Food Insecurity: No Food Insecurity  . Worried About Charity fundraiser in the Last Year: Never true  . Ran Out of Food in the Last Year: Never true  Transportation Needs: No Transportation Needs  . Lack of Transportation (Medical): No  . Lack of Transportation (  Non-Medical): No  Physical Activity: Unknown  . Days of Exercise per Week: Patient refused  . Minutes of Exercise per Session: Patient refused  Stress: No Stress Concern Present  . Feeling of Stress : Not at all  Social Connections: Unknown  . Frequency of Communication with Friends and Family: Patient refused  . Frequency of Social Gatherings with Friends and Family: Patient refused  . Attends Religious Services: Patient refused  . Active Member of Clubs or Organizations: Patient refused  . Attends Archivist Meetings: Patient refused  . Marital Status: Patient refused  Intimate Partner Violence: Unknown  . Fear of Current or Ex-Partner: Patient refused  . Emotionally Abused: Patient refused  . Physically Abused: Patient refused  . Sexually Abused: Patient refused     Review of Systems: Gen: Weight loss, generalized weakness, no fever HEENT: Denies vision or hearing complaints CV: No chest pain or shortness of breath at present Resp: Dry cough.  No sputum production.  No hemoptysis GI: Chronic diarrhea.  No blood in the stool.  Nausea, vomiting prior to admission.  Decreased appetite prior to admission, improving now GU : Able to void.  Currently has a Foley catheter.  Denies blood in the urine MS: Significant muscle loss from neuropathy. Derm:    Has bruises over knees and elbows Psych: No complaints Heme: No complaints Neuro: No complaints Endocrine: Poorly controlled diabetes  Vital Signs: Blood pressure (!) 126/92, pulse 83, temperature 97.9 F (36.6 C), temperature source Oral, resp. rate 13, height 5\' 8"  (1.727 m), weight 60.1 kg, SpO2 100  %.   Intake/Output Summary (Last 24 hours) at 03/26/2020 0904 Last data filed at 03/26/2020 0800 Gross per 24 hour  Intake 1496.72 ml  Output 950 ml  Net 546.72 ml    Weight trends: Filed Weights   03/24/20 1222 03/25/20 0625 03/26/20 0500  Weight: 48.1 kg 56.9 kg 60.1 kg    Physical Exam: General:  Thin, cachectic appearing gentleman, laying in the bed  HEENT  moist oral mucous membranes  Neck:  Supple, no masses  Lungs:  Normal breathing effort on room air  Heart::  Tachycardic no rub  Abdomen:  Soft, nontender  Extremities:  Loss of muscle mass noted, no edema  Neurologic:  Alert, able to answer questions appropriately  Skin:  Skin skin breakdown and scrapings over knees and elbows  Access:   Foley:  In place       Lab results: Basic Metabolic Panel: Recent Labs  Lab 03/25/20 0516 03/25/20 1223 03/26/20 0530  NA 140 140 137  K 4.0 4.0 4.7  CL 119* 117* 112*  CO2 12* 14* 18*  GLUCOSE 144* 141* 169*  BUN 118* 113* 109*  CREATININE 8.97* 8.99* 8.46*  CALCIUM 7.8* 7.6* 7.8*  MG 1.9  --   --   PHOS 4.2  --   --     Liver Function Tests: Recent Labs  Lab 03/24/20 1226  AST <5*  ALT 5  ALKPHOS 54  BILITOT 0.7  PROT 5.5*  ALBUMIN 2.7*   No results for input(s): LIPASE, AMYLASE in the last 168 hours. No results for input(s): AMMONIA in the last 168 hours.  CBC: Recent Labs  Lab 03/24/20 2100 03/24/20 2100 03/25/20 0516 03/25/20 1223 03/25/20 2328 03/26/20 0530  WBC 4.4  --  5.8  --   --   --   NEUTROABS 3.5  --  4.3  --   --   --   HGB 6.1*   < >  10.4*   < > 9.4* 9.8*  HCT 18.7*   < > 29.4*   < > 26.5* 28.4*  MCV 94.4  --  86.7  --   --   --   PLT 184  --  221  --   --   --    < > = values in this interval not displayed.    Cardiac Enzymes: No results for input(s): CKTOTAL, TROPONINI in the last 168 hours.  BNP: Invalid input(s): POCBNP  CBG: Recent Labs  Lab 03/25/20 1829 03/25/20 2104 03/25/20 2225 03/26/20 0016 03/26/20 0742   GLUCAP 108* 67* 138* 88 203*    Microbiology: Recent Results (from the past 720 hour(s))  SARS Coronavirus 2 by RT PCR (hospital order, performed in [facility] hospital lab) Nasopharyngeal Nasopharyngeal Swab     Status: None   Collection Time: 02/28/20  6:18 PM   Specimen: Nasopharyngeal Swab  Result Value Ref Range Status   SARS Coronavirus 2 NEGATIVE NEGATIVE Final    Comment: (NOTE) SARS-CoV-2 target nucleic acids are NOT DETECTED. The SARS-CoV-2 RNA is generally detectable in upper and lower respiratory specimens during the acute phase of infection. The lowest concentration of SARS-CoV-2 viral copies this assay can detect is 250 copies / mL. A negative result does not preclude SARS-CoV-2 infection and should not be used as the sole basis for treatment or other patient management decisions.  A negative result may occur with improper specimen collection / handling, submission of specimen other than nasopharyngeal swab, presence of viral mutation(s) within the areas targeted by this assay, and inadequate number of viral copies (<250 copies / mL). A negative result must be combined with clinical observations, patient history, and epidemiological information. Fact Sheet for Patients:   StrictlyIdeas.no Fact Sheet for Healthcare Providers: BankingDealers.co.za This test is not yet approved or cleared  by the [place] FDA and has been authorized for detection and/or diagnosis of SARS-CoV-2 by FDA under an Emergency Use Authorization (EUA).  This EUA will remain in effect (meaning this test can be used) for the duration of the COVID-19 declaration under Section 564(b)(1) of the Act, 21 U.S.C. section 360bbb-3(b)(1), unless the authorization is terminated or revoked sooner. Performed at [facility], [address] [place], [place] [address]   SARS Coronavirus 2 by RT PCR (hospital order, performed in [facility]  hospital lab) Nasopharyngeal Nasopharyngeal Swab     Status: None   Collection Time: 03/24/20 12:26 PM   Specimen: Nasopharyngeal Swab  Result Value Ref Range Status   SARS Coronavirus 2 NEGATIVE NEGATIVE Final    Comment: (NOTE) SARS-CoV-2 target nucleic acids are NOT DETECTED.  The SARS-CoV-2 RNA is generally detectable in upper and lower respiratory specimens during the acute phase of infection. The lowest concentration of SARS-CoV-2 viral copies this assay can detect is 250 copies / mL. A negative result does not preclude SARS-CoV-2 infection and should not be used as the sole basis for treatment or other patient management decisions.  A negative result may occur with improper specimen collection / handling, submission of specimen other than nasopharyngeal swab, presence of viral mutation(s) within the areas targeted by this assay, and inadequate number of viral copies (<250 copies / mL). A negative result must be combined with clinical observations, patient history, and epidemiological information.  Fact Sheet for Patients:   StrictlyIdeas.no  Fact Sheet for Healthcare Providers: BankingDealers.co.za  This test is not yet approved or  cleared by the [place] FDA and has been  authorized for detection and/or diagnosis of SARS-CoV-2 by FDA under an Emergency Use Authorization (EUA).  This EUA will remain in effect (meaning this test can be used) for the duration of the COVID-19 declaration under Section 564(b)(1) of the Act, 21 U.S.C. section 360bbb-3(b)(1), unless the authorization is terminated or revoked sooner.  Performed at [facility], [address] [place], [place] [address]   MRSA PCR Screening     Status: None   Collection Time: 03/25/20  6:14 AM   Specimen: Nasopharyngeal  Result Value Ref Range Status   MRSA by PCR NEGATIVE NEGATIVE Final    Comment:        The GeneXpert MRSA Assay (FDA approved  for NASAL specimens only), is one component of a comprehensive MRSA colonization surveillance program. It is not intended to diagnose MRSA infection nor to guide or monitor treatment for MRSA infections. Performed at [facility], [address] [place], [place] [address]      Coagulation Studies: Recent Labs    03/24/20 03-Jan-2099  LABPROT 16.5*  INR 1.4*    Urinalysis: Recent Labs    03/24/20 1701  COLORURINE YELLOW*  LABSPEC 1.010  PHURINE 5.0  GLUCOSEU >=500*  HGBUR MODERATE*  BILIRUBINUR NEGATIVE  KETONESUR NEGATIVE  PROTEINUR 100*  NITRITE NEGATIVE  LEUKOCYTESUR LARGE*        Imaging: US RENAL  Result Date: 03/25/2020 CLINICAL DATA:  Acute renal failure. EXAM: RENAL / URINARY TRACT ULTRASOUND COMPLETE COMPARISON:  None. FINDINGS: Right Kidney: Renal measurements: 12.5 x 5.2 x 5.7 cm = volume: 196 mL. Markedly increased parenchymal echogenicity demonstrated. No mass or hydronephrosis visualized. Mild right perinephric fluid is noted as well as mild ascites. Left Kidney: Renal measurements: 11.7 x 5.5 x 5.4 cm = volume: 182 mL. Markedly increased parenchymal echogenicity demonstrated. No mass or hydronephrosis visualized. Mild left perinephric fluid noted as well as mild ascites. Bladder: Foley catheter is seen in place. Diffuse bladder wall thickening is seen, even allowing for incomplete bladder distension. Other: None. IMPRESSION: Normal size kidneys with markedly increased parenchymal echogenicity, consistent with medical renal disease. No evidence of renal mass or hydronephrosis. Mild bilateral perinephric fluid, and mild ascites. Diffuse bladder wall thickening, with differential diagnosis including cystitis and chronic bladder outlet obstruction. Electronically Signed   By: Marlaine Hind M.D.   On: 03/25/2020 09:18   DG Chest Port 1 View  Result Date: 03/25/2020 CLINICAL DATA:  Initial evaluation for interval changes. EXAM: PORTABLE CHEST 1 VIEW  COMPARISON:  Prior radiograph from 03/24/2020. FINDINGS: Cardiac and mediastinal silhouettes are stable, and remain within normal limits. Lungs are hypoinflated. Interval development of mild diffuse pulmonary vascular and interstitial congestion, increased from previous. No frank pulmonary edema. No pleural effusion. Linear atelectatic changes present along the right minor fissure. Superimposed right upper lobe nodular density, stable from previous. Osseous structures and soft tissues are unchanged. IMPRESSION: 1. Interval development of mild diffuse pulmonary vascular and interstitial congestion, increased from previous. No frank pulmonary edema. 2. Stable right upper lobe nodular density. Electronically Signed   By: Jeannine Boga M.D.   On: 03/25/2020 03:53   DG Chest Portable 1 View  Result Date: 03/24/2020 CLINICAL DATA:  Weakness.  Hyperglycemia. EXAM: PORTABLE CHEST 1 VIEW COMPARISON:  Two-view chest x-ray 02/07/2020 FINDINGS: Heart size normal. Nodular density in right upper lobe is again seen. Lower lobe airspace disease is improving. Some residual right lower lobe airspace disease is noted. IMPRESSION: 1. Improving right lower lobe airspace disease. 2. Persistent nodular  density in the right upper lobe. Electronically Signed   By: San Morelle M.D.   On: 03/24/2020 13:54      Assessment & Plan: Pt is a 44 y.o.   male with Diabetes, insulin-dependent.  Diagnosed at age 71. Diabetic complications of gastroparesis, neuropathy, Several hospitalizations for DKA History of esophageal candidiasis Chronic diarrhea History of C. difficile colitis H/o Quaniferon + in 2017   , was admitted on 03/24/2020 with DKA (diabetic ketoacidoses) ([place]) [E11.10] AKI (acute kidney injury) ([place]) [N17.9] Hypotension, unspecified hypotension type [I95.9] Diabetic ketoacidosis without coma associated with type 2 diabetes mellitus ([place]) [E11.10]  #Acute kidney injury Baseline creatinine 1.12/GFR >  60 AKI likely pre-renal and ATN due to hemodynamic shifts and acute illness Patient is non oliguric Electrolytes and Volume status are acceptable No acute indication for Dialysis at present  # Diabetic ketoacidosis Management as per ICU and hospitalist team Can consider Sodium bicarb infusion Will d/w hospitalist team        LOS: 2 Ikhlas Albo 6/16/20219:04 AM    Note: This note was prepared with Dragon dictation. Any transcription errors are unintentional

## 2020-03-27 LAB — GLUCOSE, CAPILLARY
Glucose-Capillary: 79 mg/dL (ref 70–99)
Glucose-Capillary: 101 mg/dL — ABNORMAL HIGH (ref 70–99)
Glucose-Capillary: 49 mg/dL — ABNORMAL LOW (ref 70–99)
Glucose-Capillary: 66 mg/dL — ABNORMAL LOW (ref 70–99)
Glucose-Capillary: 79 mg/dL (ref 70–99)
Glucose-Capillary: 81 mg/dL (ref 70–99)

## 2020-03-27 LAB — BASIC METABOLIC PANEL
Anion gap: 9 (ref 5–15)
BUN: 107 mg/dL — ABNORMAL HIGH (ref 6–20)
CO2: 18 mmol/L — ABNORMAL LOW (ref 22–32)
Calcium: 7.9 mg/dL — ABNORMAL LOW (ref 8.9–10.3)
Chloride: 109 mmol/L (ref 98–111)
Creatinine, Ser: 8.09 mg/dL — ABNORMAL HIGH (ref 0.61–1.24)
GFR calc Af Amer: 8 mL/min — ABNORMAL LOW (ref >60)
GFR calc non Af Amer: 7 mL/min — ABNORMAL LOW (ref >60)
Glucose, Bld: 101 mg/dL — ABNORMAL HIGH (ref 70–99)
Potassium: 4.9 mmol/L (ref 3.5–5.1)
Sodium: 136 mmol/L (ref 135–145)

## 2020-03-27 MED ORDER — DULOXETINE HCL 20 MG PO CPEP
20.0000 mg | ORAL_CAPSULE | Freq: Every day | ORAL | Status: DC
  Administered 2020-03-28 – 2020-03-31 (×4): 20 mg via ORAL
  Filled 2020-03-27 (×7): qty 1

## 2020-03-27 MED ORDER — CYPROHEPTADINE HCL 4 MG PO TABS
4.0000 mg | ORAL_TABLET | Freq: Three times a day (TID) | ORAL | Status: DC
  Filled 2020-03-27 (×2): qty 1

## 2020-03-27 MED ORDER — MIRTAZAPINE 15 MG PO TBDP
15.0000 mg | ORAL_TABLET | Freq: Every day | ORAL | Status: DC
  Administered 2020-03-27 – 2020-03-31 (×5): 15 mg via ORAL
  Filled 2020-03-27 (×5): qty 1

## 2020-03-27 MED ORDER — INSULIN DETEMIR 100 UNIT/ML ~~LOC~~ SOLN
6.0000 [IU] | Freq: Every day | SUBCUTANEOUS | Status: DC
  Administered 2020-03-29: 6 [IU] via SUBCUTANEOUS
  Filled 2020-03-27 (×4): qty 0.06

## 2020-03-27 NOTE — Progress Notes (Signed)
Inpatient Diabetes Program Recommendations  AACE/ADA: New Consensus Statement on Inpatient Glycemic Control   Target Ranges:  Prepandial:   less than 140 mg/dL      Peak postprandial:   less than 180 mg/dL (1-2 hours)      Critically ill patients:  140 - 180 mg/dL   Results for Gregory Moody, Gregory Moody (MRN [id number]) as of 03/27/2020 07:54  Ref. Range 03/26/2020 00:16 03/26/2020 07:42 03/26/2020 11:43 03/26/2020 15:59 03/26/2020 22:02 03/26/2020 22:41 03/26/2020 23:13  Glucose-Capillary Latest Ref Range: 70 - 99 mg/dL 88 203 (H)  Novolog 6 units 176 (H)  Novolog 5 units  Levemir 10 units 79 47 (L) 66 (L) 186 (H)   Review of Glycemic Control  Diabetes history:DM2 Outpatient Diabetes medications:70/30 30 units BID Current orders for Inpatient glycemic control:Levemir10 units daily, Novolog 3 units TID with meals, Novolog 0-9 units TID with meals, Novolog 0-5 units QHS Inpatient Diabetes Program Recommendations:   Insulin - Basal: Please consider decreasing Levemir to 6 units daily.  Insulin-Meal Coverage: Noted in reviewing chart that patient received meal coverage for lunch on 03/26/20 and 0% of meal was eaten.  NURSING: Please be sure patient eats at least 50% of meals before giving meal coverage.  Thanks, Barnie Alderman, RN, MSN, CDE Diabetes Coordinator Inpatient Diabetes Program [phone] (Team Pager from 8am to 5pm)

## 2020-03-27 NOTE — Progress Notes (Signed)
[address] [place], [place] [address] Phone [phone]. Fax [phone]  Date: 03/27/2020                  Patient Name:  Gregory Moody  MRN: [id number]  DOB: 18-Jun-1976  Age / Sex: 44 y.o., male         PCP: [place]                 Service Requesting Consult: IM/ Lorella Nimrod, MD                 Reason for Consult: ARF            History of Present Illness: Patient is a 44 y.o. male with medical problems of insulin-dependent diabetes (diagnosed at age 33)  with complications of gastroparesis, neuropathy, several admissions for DKA, chronic malnutrition, history of Covid infection July 2020, hospitalization for esophageal candidiasis, chronic diarrhea, recent C. difficile colitis, history of urinary retention, who was admitted to [facility] on 03/24/2020 for evaluation of generalized weakness, lack of appetite, nausea.  He was diagnosed with diabetic acid acidosis and admitted to ICU for further management  Nephrology consult has been requested for evaluation and management of acute kidney injury.  Patient has a baseline creatinine of 1.12 from Mar 02, 2020. Admission creatinine of 12.8 on June 14.    Resting quietly today On the phone ordering his lunch when seen Denies any acute complaints Shortness of breath Serum creatinine slightly more improved to 8.1   Current medications: Current Facility-Administered Medications  Medication Dose Route Frequency Provider Last Rate Last Admin   0.9 %  sodium chloride infusion   Intravenous Continuous Lorella Nimrod, MD   Held at 03/25/20 2300   acetaminophen (TYLENOL) tablet 650 mg  650 mg Oral Q4H PRN Flora Lipps, MD       Chlorhexidine Gluconate Cloth 2 % PADS 6 each  6 each Topical Daily Flora Lipps, MD   6 each at 03/27/20 0938   dextrose 50 % solution 0-50 mL  0-50 mL Intravenous PRN Arta Silence, MD   50 mL at 03/26/20 2247   docusate sodium (COLACE) capsule 100 mg  100 mg  Oral BID PRN Flora Lipps, MD       DULoxetine (CYMBALTA) DR capsule 20 mg  20 mg Oral Daily Eulas Post, MD       insulin aspart (novoLOG) injection 0-5 Units  0-5 Units Subcutaneous QHS Dallie Piles, RPH       insulin aspart (novoLOG) injection 0-9 Units  0-9 Units Subcutaneous TID WC Dallie Piles, RPH   2 Units at 03/27/20 0936   insulin aspart (novoLOG) injection 3 Units  3 Units Subcutaneous TID WC Awilda Bill, NP   3 Units at 03/27/20 0937   insulin detemir (LEVEMIR) injection 6 Units  6 Units Subcutaneous QHS Lorella Nimrod, MD       mirtazapine (REMERON SOL-TAB) disintegrating tablet 15 mg  15 mg Oral QHS Eulas Post, MD       ondansetron [facility]) injection 4 mg  4 mg Intravenous Q6H PRN Flora Lipps, MD   4 mg at 03/26/20 2012   pantoprazole (PROTONIX) EC tablet 40 mg  40 mg Oral BID Dallie Piles, RPH   40 mg at 03/27/20 0935   polyethylene glycol (MIRALAX / GLYCOLAX) packet 17 g  17 g Oral Daily PRN Flora Lipps, MD       sodium bicarbonate tablet 650 mg  650 mg  Oral BID Lorella Nimrod, MD   650 mg at 03/26/20 2211      Vital Signs: Blood pressure 112/87, pulse 70, temperature 98.2 F (36.8 C), resp. rate 16, height 5\' 8"  (1.727 m), weight 60.1 kg, SpO2 100 %.   Intake/Output Summary (Last 24 hours) at 03/27/2020 1305 Last data filed at 03/26/2020 2200 Gross per 24 hour  Intake 240 ml  Output 300 ml  Net -60 ml    Weight trends: Filed Weights   03/24/20 1222 03/25/20 0625 03/26/20 0500  Weight: 48.1 kg 56.9 kg 60.1 kg    Physical Exam: General:  Thin, cachectic appearing gentleman, laying in the bed  HEENT  moist oral mucous membranes  Neck:  Supple, no masses  Lungs:  Normal breathing effort on room air  Heart::  Tachycardic no rub  Abdomen:  Soft, nontender  Extremities:  Loss of muscle mass noted, no edema  Neurologic:  Alert, able to answer simple questions  Skin:  Skin skin breakdown and scrapings over knees and elbows  Access:    Foley:  In place       Lab results: Basic Metabolic Panel: Recent Labs  Lab 03/25/20 0516 03/25/20 0516 03/25/20 1223 03/26/20 0530 03/27/20 0616  NA 140   < > 140 137 136  K 4.0   < > 4.0 4.7 4.9  CL 119*   < > 117* 112* 109  CO2 12*   < > 14* 18* 18*  GLUCOSE 144*   < > 141* 169* 101*  BUN 118*   < > 113* 109* 107*  CREATININE 8.97*   < > 8.99* 8.46* 8.09*  CALCIUM 7.8*   < > 7.6* 7.8* 7.9*  MG 1.9  --   --   --   --   PHOS 4.2  --   --   --   --    < > = values in this interval not displayed.    Liver Function Tests: Recent Labs  Lab 03/24/20 1226  AST <5*  ALT 5  ALKPHOS 54  BILITOT 0.7  PROT 5.5*  ALBUMIN 2.7*   No results for input(s): LIPASE, AMYLASE in the last 168 hours. No results for input(s): AMMONIA in the last 168 hours.  CBC: Recent Labs  Lab 03/24/20 2100 03/24/20 2100 03/25/20 0516 03/25/20 1223 03/25/20 2328 03/26/20 0530  WBC 4.4  --  5.8  --   --   --   NEUTROABS 3.5  --  4.3  --   --   --   HGB 6.1*   < > 10.4*   < > 9.4* 9.8*  HCT 18.7*   < > 29.4*   < > 26.5* 28.4*  MCV 94.4  --  86.7  --   --   --   PLT 184  --  221  --   --   --    < > = values in this interval not displayed.    Cardiac Enzymes: No results for input(s): CKTOTAL, TROPONINI in the last 168 hours.  BNP: Invalid input(s): POCBNP  CBG: Recent Labs  Lab 03/26/20 2202 03/26/20 2241 03/26/20 2313 03/27/20 1213 03/27/20 1251  GLUCAP 47* 66* 186* 79 101*    Microbiology: Recent Results (from the past 720 hour(s))  SARS Coronavirus 2 by RT PCR (hospital order, performed in [facility] hospital lab) Nasopharyngeal Nasopharyngeal Swab     Status: None   Collection Time: 02/28/20  6:18 PM   Specimen: Nasopharyngeal Swab  Result  Value Ref Range Status   SARS Coronavirus 2 NEGATIVE NEGATIVE Final    Comment: (NOTE) SARS-CoV-2 target nucleic acids are NOT DETECTED. The SARS-CoV-2 RNA is generally detectable in upper and lower respiratory specimens during  the acute phase of infection. The lowest concentration of SARS-CoV-2 viral copies this assay can detect is 250 copies / mL. A negative result does not preclude SARS-CoV-2 infection and should not be used as the sole basis for treatment or other patient management decisions.  A negative result may occur with improper specimen collection / handling, submission of specimen other than nasopharyngeal swab, presence of viral mutation(s) within the areas targeted by this assay, and inadequate number of viral copies (<250 copies / mL). A negative result must be combined with clinical observations, patient history, and epidemiological information. Fact Sheet for Patients:   StrictlyIdeas.no Fact Sheet for Healthcare Providers: BankingDealers.co.za This test is not yet approved or cleared  by the [place] FDA and has been authorized for detection and/or diagnosis of SARS-CoV-2 by FDA under an Emergency Use Authorization (EUA).  This EUA will remain in effect (meaning this test can be used) for the duration of the COVID-19 declaration under Section 564(b)(1) of the Act, 21 U.S.C. section 360bbb-3(b)(1), unless the authorization is terminated or revoked sooner. Performed at [facility], [address] [place], [place] [address]   SARS Coronavirus 2 by RT PCR (hospital order, performed in [facility] hospital lab) Nasopharyngeal Nasopharyngeal Swab     Status: None   Collection Time: 03/24/20 12:26 PM   Specimen: Nasopharyngeal Swab  Result Value Ref Range Status   SARS Coronavirus 2 NEGATIVE NEGATIVE Final    Comment: (NOTE) SARS-CoV-2 target nucleic acids are NOT DETECTED.  The SARS-CoV-2 RNA is generally detectable in upper and lower respiratory specimens during the acute phase of infection. The lowest concentration of SARS-CoV-2 viral copies this assay can detect is 250 copies / mL. A negative result does not preclude SARS-CoV-2  infection and should not be used as the sole basis for treatment or other patient management decisions.  A negative result may occur with improper specimen collection / handling, submission of specimen other than nasopharyngeal swab, presence of viral mutation(s) within the areas targeted by this assay, and inadequate number of viral copies (<250 copies / mL). A negative result must be combined with clinical observations, patient history, and epidemiological information.  Fact Sheet for Patients:   StrictlyIdeas.no  Fact Sheet for Healthcare Providers: BankingDealers.co.za  This test is not yet approved or  cleared by the [place] FDA and has been authorized for detection and/or diagnosis of SARS-CoV-2 by FDA under an Emergency Use Authorization (EUA).  This EUA will remain in effect (meaning this test can be used) for the duration of the COVID-19 declaration under Section 564(b)(1) of the Act, 21 U.S.C. section 360bbb-3(b)(1), unless the authorization is terminated or revoked sooner.  Performed at [facility], [address] [place], [place] [address]   MRSA PCR Screening     Status: None   Collection Time: 03/25/20  6:14 AM   Specimen: Nasopharyngeal  Result Value Ref Range Status   MRSA by PCR NEGATIVE NEGATIVE Final    Comment:        The GeneXpert MRSA Assay (FDA approved for NASAL specimens only), is one component of a comprehensive MRSA colonization surveillance program. It is not intended to diagnose MRSA infection nor to guide or monitor treatment for MRSA infections. Performed at [facility], [place]  [address], [place], [place] [address]      Coagulation Studies: Recent Labs    03/24/20 01/08/2099  LABPROT 16.5*  INR 1.4*    Urinalysis: Recent Labs    03/24/20 1701  COLORURINE YELLOW*  LABSPEC 1.010  PHURINE 5.0  GLUCOSEU >=500*  HGBUR MODERATE*  BILIRUBINUR NEGATIVE  KETONESUR  NEGATIVE  PROTEINUR 100*  NITRITE NEGATIVE  LEUKOCYTESUR LARGE*        Imaging: No results found.   Assessment & Plan: Pt is a 44 y.o.   male with Diabetes, insulin-dependent.  Diagnosed at age 33. Diabetic complications of gastroparesis, neuropathy, Several hospitalizations for DKA History of esophageal candidiasis Chronic diarrhea History of C. difficile colitis H/o Quaniferon + in 01/09/2016   was admitted on 03/24/2020 with DKA (diabetic ketoacidoses) ([place]) [E11.10] AKI (acute kidney injury) ([place]) [N17.9] Hypotension, unspecified hypotension type [I95.9] Diabetic ketoacidosis without coma associated with type 2 diabetes mellitus ([place]) [E11.10]  #Acute kidney injury Baseline creatinine 1.12/GFR > 60 AKI likely pre-renal and ATN due to hemodynamic shifts and acute illness Patient is non oliguric Electrolytes and Volume status are acceptable No acute indication for Dialysis at present Will continue to follow  # Diabetic ketoacidosis Management as per ICU and hospitalist team  continue sodium bicarbonate supplementation  #Major depression Transferring to inpatient psychiatry today or tomorrow         LOS: 3 Roshan Salamon 6/17/20211:05 PM    Note: This note was prepared with Dragon dictation. Any transcription errors are unintentional

## 2020-03-27 NOTE — Evaluation (Signed)
Physical Therapy Evaluation Patient Details Name: Gregory Moody MRN: [id number] DOB: 12-Aug-1976 Today's Date: 03/27/2020   History of Present Illness  44 y.o. 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 difficile colitis, undergoing colonoscopy with negative biopsies, with hospitalization being complicated by acute urinary retention requiring temporary Foley placement, followed by another admission from 02/06/2020 to 02/09/2020 with similar complaints and urinary retention requiring Foley catheter.  Again came with complaints of generalized weakness, lack of appetite and nausea.  Found to be in DKA.  Not using his insulin as directed since prior to discharge.  Also AKI secondary to urinary retention.  Clinical Impression  Pt willing to participate but seemed more interested in placing food order and organizing timing for it.  He was able to get himself to EOB and to standing w/o assist but showed impulsivity and poor decision making on numerous occasions that did not denote overt safety issues but were concerning given the inconsistency in mobility and tolerance with this pt.  He did similarly to the multiple previous admits he has had, he is neither needs nor is interested in HHPT at d/c, will maintain on PT caseload while here to promote activity as able.     Follow Up Recommendations No PT follow up    Equipment Recommendations  None recommended by PT    Recommendations for Other Services       Precautions / Restrictions Precautions Precautions: Fall Restrictions Weight Bearing Restrictions: No      Mobility  Bed Mobility Overal bed mobility: Modified Independent             General bed mobility  comments: Pt able to get himself to EOB w/o assist.  Transfers Overall transfer level: Needs assistance Equipment used: Rolling walker (2 wheeled) Transfers: Sit to/from Stand Sit to Stand: Supervision         General transfer comment: Pt able to rise to standing w/o direct assist, needed heavy UE use  Ambulation/Gait Ambulation/Gait assistance: Min guard Gait Distance (Feet): 200 Feet Assistive device: Rolling walker (2 wheeled)       General Gait Details: Pt is able to ambulate with relatively consistent cadence, leaning over on walker but able to increase cadence with cuing and increased time.  Final 30 ft w/o AD with limping but no LOBs with gait.    Stairs            Wheelchair Mobility    Modified Rankin (Stroke Patients Only)       Balance Overall balance assessment: Modified Independent (pt with no LOBs but lateral lean with ambulation w/o AD)                                           Pertinent Vitals/Pain Pain Assessment: No/denies pain    Home Living Family/patient expects to be discharged to:: Private residence Living Arrangements: Parent;Children Available Help at Discharge: Family;Available 24 hours/day Type of Home: Mobile home Home Access: Stairs to enter Entrance Stairs-Rails: Right;Left Entrance Stairs-Number of Steps: 5 Home Layout: One level Home Equipment: Walker - 2 wheels;Cane - quad;Cane - single point Additional Comments: conflicting information on where he is living with family or on the streets?  Prior Function Level of Independence: Independent with assistive device(s)         Comments: Pt has been in and out of hospital many times in the last few months.  Apparently able to care for himself... when he is motivated to care for himself     Hand Dominance        Extremity/Trunk Assessment   Upper Extremity Assessment Upper Extremity Assessment: Generalized weakness    Lower Extremity  Assessment Lower Extremity Assessment: Generalized weakness       Communication   Communication: No difficulties (declines interpreter when offered)  Cognition Arousal/Alertness: Awake/alert Behavior During Therapy: WFL for tasks assessed/performed;Impulsive Overall Cognitive Status: Within Functional Limits for tasks assessed                                 General Comments: Pt more focused on getting next meal ordered      General Comments      Exercises     Assessment/Plan    PT Assessment Patient needs continued PT services  PT Problem List Decreased strength;Decreased range of motion;Decreased mobility;Decreased activity tolerance;Decreased balance;Decreased knowledge of use of DME;Decreased safety awareness       PT Treatment Interventions DME instruction;Gait training;Stair training;Functional mobility training;Therapeutic activities;Therapeutic exercise;Balance training;Neuromuscular re-education;Patient/family education    PT Goals (Current goals can be found in the Care Plan section)  Acute Rehab PT Goals Patient Stated Goal: walk out of the hospital PT Goal Formulation: With patient Time For Goal Achievement: 04/10/20 Potential to Achieve Goals: Fair    Frequency Min 2X/week   Barriers to discharge        Co-evaluation               AM-PAC PT "6 Clicks" Mobility  Outcome Measure Help needed turning from your back to your side while in a flat bed without using bedrails?: None Help needed moving from lying on your back to sitting on the side of a flat bed without using bedrails?: None Help needed moving to and from a bed to a chair (including a wheelchair)?: None Help needed standing up from a chair using your arms (e.g., wheelchair or bedside chair)?: None Help needed to walk in hospital room?: A Little Help needed climbing 3-5 steps with a railing? : A Little 6 Click Score: 22    End of Session Equipment Utilized During Treatment:  Gait belt Activity Tolerance: Patient tolerated treatment well Patient left: with chair alarm set;with call bell/phone within reach Nurse Communication: Mobility status PT Visit Diagnosis: Muscle weakness (generalized) (M62.81);Difficulty in walking, not elsewhere classified (R26.2)    Time: 9030-0923 PT Time Calculation (min) (ACUTE ONLY): 28 min   Charges:   PT Evaluation $PT Eval Low Complexity: 1 Low PT Treatments $Gait Training: 8-22 mins        Kreg Shropshire, DPT 03/27/2020, 2:19 PM

## 2020-03-27 NOTE — Consult Note (Signed)
[facility] Face-to-Face Psychiatry Consult   Reason for Consult:   Psychological factors affecting physical condition.   Depression due to medical illness  Major depression severe recurrent Dysthymia  Generalized anxiety  Family discord Cultural issues   Referring Physician:  Dr. Karalee Height and IM team ----   Patient Identification: Gregory Moody MRN:  [id number] Principal Diagnosis:  See above   Diagnosis:  Active Problems:   DKA (diabetic ketoacidoses) ([place])  DM / related issues see medical list   Total Time spent with patient: one hour plus 20   Subjective:   Gregory Moody is a 44 y.o. male patient admitted with adult onset DM, and numerous complications related to this.  He has had numerous revolving door admits for DKA and other related DM problems---as well as complications from diarrhea from various Sources.  He is currently stable medically.  IM was concerned about his condition.  And asked for Psych input   HPI:  Patient describes difficult time with not working over several years. . Parents have been pressuring him to go out and work because they are working and 45 year old is also working.    However he feels his mood is too affected and elements of major depression both primary as well as from physical and psychological factors.  He also has generalized anxiety and social anxiety --post covid so it is hard for him to leave the house.   He describes depressed mood, crying spells hopeless helpless feelings, lack of energy, appetite loss, anhedonia, lack of sleep, slowness, lack of motivation, concentration and attention .  He has no active SI and plans but when depressed cannot manage his DM well.   He has anxiety, worry, nervousness, tension, frustration, somatic symptoms autonomic symptoms -dread fear doom and gloom and panic like symptoms   He especially feels his depression and anxiety due to illness from DM and other complications.    He is willing  to do inpatient treatment and take medications to help his mood and situation   Past Psychiatric History:  No past psych treatment , inpatient or outpatient visits   Risk to Self:  risk of clinical deterioration  Risk to Others:  none  Prior Inpatient Therapy:  none Prior Outpatient Therapy:  none   Past Medical History:  Past Medical History:  Diagnosis Date  . COVID-19   . Diabetes mellitus without complication ([place])   . Gastroparesis   . Tuberculosis     Past Surgical History:  Procedure Laterality Date  . COLONOSCOPY N/A 01/25/2020   Procedure: COLONOSCOPY;  Surgeon: Toledo, Benay Pike, MD;  Location: [facility] ENDOSCOPY;  Service: Gastroenterology;  Laterality: N/A;  . ESOPHAGOGASTRODUODENOSCOPY N/A 02/03/2019   Procedure: ESOPHAGOGASTRODUODENOSCOPY (EGD);  Surgeon: Toledo, Benay Pike, MD;  Location: [facility] ENDOSCOPY;  Service: Gastroenterology;  Laterality: N/A;   Family History:  Family History  Problem Relation Age of Onset  . Diabetes Mother   . Diabetes Father    Family Psychiatric  History: none  Social History:  None  Social History   Substance and Sexual Activity  Alcohol Use No     Social History   Substance and Sexual Activity  Drug Use No    Social History   Socioeconomic History  . Marital status: Single    Spouse name: Not on file  . Number of children: 3  . Years of education: Not on file  . Highest education level: Not on file  Occupational History  . Occupation: unemployed   Tobacco Use  .  Smoking status: Current Some Day Smoker    Types: Cigars  . Smokeless tobacco: Never Used  Vaping Use  . Vaping Use: Never used  Substance and Sexual Activity  . Alcohol use: No  . Drug use: No  . Sexual activity: Not on file  Other Topics Concern  . Not on file  Social History Narrative   Resides with daughter only weekends    Social Determinants of Health   Financial Resource Strain: Low Risk   . Difficulty of Paying Living Expenses: Not hard at  all  Food Insecurity: No Food Insecurity  . Worried About Charity fundraiser in the Last Year: Never true  . Ran Out of Food in the Last Year: Never true  Transportation Needs: No Transportation Needs  . Lack of Transportation (Medical): No  . Lack of Transportation (Non-Medical): No  Physical Activity: Unknown  . Days of Exercise per Week: Patient refused  . Minutes of Exercise per Session: Patient refused  Stress: No Stress Concern Present  . Feeling of Stress : Not at all  Social Connections: Unknown  . Frequency of Communication with Friends and Family: Patient refused  . Frequency of Social Gatherings with Friends and Family: Patient refused  . Attends Religious Services: Patient refused  . Active Member of Clubs or Organizations: Patient refused  . Attends Archivist Meetings: Patient refused  . Marital Status: Patient refused   Additional Social History: he feels cultural issues play a role in pressuring to find work --due to Hispanic high work ethic and all     Allergies:  No Known Allergies  Labs:  Results for orders placed or performed during the hospital encounter of 03/24/20 (from the past 48 hour(s))  Glucose, capillary     Status: Abnormal   Collection Time: 03/25/20 10:59 AM  Result Value Ref Range   Glucose-Capillary 143 (H) 70 - 99 mg/dL    Comment: Glucose reference range applies only to samples taken after fasting for at least 8 hours.  Glucose, capillary     Status: Abnormal   Collection Time: 03/25/20 11:58 AM  Result Value Ref Range   Glucose-Capillary 145 (H) 70 - 99 mg/dL    Comment: Glucose reference range applies only to samples taken after fasting for at least 8 hours.  Hemoglobin and hematocrit, blood     Status: Abnormal   Collection Time: 03/25/20 12:23 PM  Result Value Ref Range   Hemoglobin 9.6 (L) 13.0 - 17.0 g/dL   HCT 27.2 (L) 39 - 52 %    Comment: Performed at [facility], [address] [place], [place] [address]   Basic metabolic panel     Status: Abnormal   Collection Time: 03/25/20 12:23 PM  Result Value Ref Range   Sodium 140 135 - 145 mmol/L   Potassium 4.0 3.5 - 5.1 mmol/L   Chloride 117 (H) 98 - 111 mmol/L   CO2 14 (L) 22 - 32 mmol/L   Glucose, Bld 141 (H) 70 - 99 mg/dL    Comment: Glucose reference range applies only to samples taken after fasting for at least 8 hours.   BUN 113 (H) 6 - 20 mg/dL    Comment: RESULT CONFIRMED BY MANUAL DILUTION KLW   Creatinine, Ser 8.99 (H) 0.61 - 1.24 mg/dL   Calcium 7.6 (L) 8.9 - 10.3 mg/dL   GFR calc non Af Amer 6 (L) >60 mL/min   GFR calc Af Amer 7 (L) >60 mL/min   Anion  gap 9 5 - 15    Comment: Performed at [facility], [address] [place], [place] [address]  Glucose, capillary     Status: Abnormal   Collection Time: 03/25/20  4:54 PM  Result Value Ref Range   Glucose-Capillary 40 (LL) 70 - 99 mg/dL    Comment: Glucose reference range applies only to samples taken after fasting for at least 8 hours.   Comment 1 Notify RN    Comment 2 Repeat Test   Glucose, capillary     Status: Abnormal   Collection Time: 03/25/20  4:55 PM  Result Value Ref Range   Glucose-Capillary 41 (LL) 70 - 99 mg/dL    Comment: Glucose reference range applies only to samples taken after fasting for at least 8 hours.   Comment 1 Notify RN   Glucose, capillary     Status: Abnormal   Collection Time: 03/25/20  5:18 PM  Result Value Ref Range   Glucose-Capillary 64 (L) 70 - 99 mg/dL    Comment: Glucose reference range applies only to samples taken after fasting for at least 8 hours.  Glucose, capillary     Status: Abnormal   Collection Time: 03/25/20  5:55 PM  Result Value Ref Range   Glucose-Capillary 65 (L) 70 - 99 mg/dL    Comment: Glucose reference range applies only to samples taken after fasting for at least 8 hours.  Hemoglobin and hematocrit, blood     Status: Abnormal   Collection Time: 03/25/20  6:11 PM  Result Value Ref Range   Hemoglobin 9.2  (L) 13.0 - 17.0 g/dL   HCT 26.5 (L) 39 - 52 %    Comment: Performed at [facility], [address] [place], [place] [address]  Glucose, capillary     Status: Abnormal   Collection Time: 03/25/20  6:29 PM  Result Value Ref Range   Glucose-Capillary 108 (H) 70 - 99 mg/dL    Comment: Glucose reference range applies only to samples taken after fasting for at least 8 hours.  Glucose, capillary     Status: Abnormal   Collection Time: 03/25/20  9:04 PM  Result Value Ref Range   Glucose-Capillary 67 (L) 70 - 99 mg/dL    Comment: Glucose reference range applies only to samples taken after fasting for at least 8 hours.  Glucose, capillary     Status: Abnormal   Collection Time: 03/25/20 10:25 PM  Result Value Ref Range   Glucose-Capillary 138 (H) 70 - 99 mg/dL    Comment: Glucose reference range applies only to samples taken after fasting for at least 8 hours.  Hemoglobin and hematocrit, blood     Status: Abnormal   Collection Time: 03/25/20 11:28 PM  Result Value Ref Range   Hemoglobin 9.4 (L) 13.0 - 17.0 g/dL   HCT 26.5 (L) 39 - 52 %    Comment: Performed at [facility], [address] [place], [place] [address]  Glucose, capillary     Status: None   Collection Time: 03/26/20 12:16 AM  Result Value Ref Range   Glucose-Capillary 88 70 - 99 mg/dL    Comment: Glucose reference range applies only to samples taken after fasting for at least 8 hours.  Basic metabolic panel     Status: Abnormal   Collection Time: 03/26/20  5:30 AM  Result Value Ref Range   Sodium 137 135 - 145 mmol/L   Potassium 4.7 3.5 - 5.1 mmol/L   Chloride 112 (H) 98 -  111 mmol/L   CO2 18 (L) 22 - 32 mmol/L   Glucose, Bld 169 (H) 70 - 99 mg/dL    Comment: Glucose reference range applies only to samples taken after fasting for at least 8 hours.   BUN 109 (H) 6 - 20 mg/dL    Comment: RESULT CONFIRMED BY MANUAL DILUTION RH   Creatinine, Ser 8.46 (H) 0.61 - 1.24 mg/dL   Calcium 7.8 (L) 8.9 - 10.3  mg/dL   GFR calc non Af Amer 7 (L) >60 mL/min   GFR calc Af Amer 8 (L) >60 mL/min   Anion gap 7 5 - 15    Comment: Performed at [facility], [address] [place], [place] [address]  Hemoglobin and hematocrit, blood     Status: Abnormal   Collection Time: 03/26/20  5:30 AM  Result Value Ref Range   Hemoglobin 9.8 (L) 13.0 - 17.0 g/dL   HCT 28.4 (L) 39 - 52 %    Comment: Performed at [facility], [address] [place], [place] [address]  Glucose, capillary     Status: Abnormal   Collection Time: 03/26/20  7:42 AM  Result Value Ref Range   Glucose-Capillary 203 (H) 70 - 99 mg/dL    Comment: Glucose reference range applies only to samples taken after fasting for at least 8 hours.  Glucose, capillary     Status: Abnormal   Collection Time: 03/26/20 11:43 AM  Result Value Ref Range   Glucose-Capillary 176 (H) 70 - 99 mg/dL    Comment: Glucose reference range applies only to samples taken after fasting for at least 8 hours.  Glucose, capillary     Status: None   Collection Time: 03/26/20  3:59 PM  Result Value Ref Range   Glucose-Capillary 79 70 - 99 mg/dL    Comment: Glucose reference range applies only to samples taken after fasting for at least 8 hours.  Glucose, capillary     Status: Abnormal   Collection Time: 03/26/20 10:02 PM  Result Value Ref Range   Glucose-Capillary 47 (L) 70 - 99 mg/dL    Comment: Glucose reference range applies only to samples taken after fasting for at least 8 hours.  Glucose, capillary     Status: Abnormal   Collection Time: 03/26/20 10:41 PM  Result Value Ref Range   Glucose-Capillary 66 (L) 70 - 99 mg/dL    Comment: Glucose reference range applies only to samples taken after fasting for at least 8 hours.  Glucose, capillary     Status: Abnormal   Collection Time: 03/26/20 11:13 PM  Result Value Ref Range   Glucose-Capillary 186 (H) 70 - 99 mg/dL    Comment: Glucose reference range applies only to samples taken after fasting  for at least 8 hours.  Basic metabolic panel     Status: Abnormal   Collection Time: 03/27/20  6:16 AM  Result Value Ref Range   Sodium 136 135 - 145 mmol/L   Potassium 4.9 3.5 - 5.1 mmol/L   Chloride 109 98 - 111 mmol/L   CO2 18 (L) 22 - 32 mmol/L   Glucose, Bld 101 (H) 70 - 99 mg/dL    Comment: Glucose reference range applies only to samples taken after fasting for at least 8 hours.   BUN 107 (H) 6 - 20 mg/dL    Comment: RESULT CONFIRMED BY MANUAL DILUTION SNG   Creatinine, Ser 8.09 (H) 0.61 - 1.24 mg/dL   Calcium 7.9 (L) 8.9 - 10.3  mg/dL   GFR calc non Af Amer 7 (L) >60 mL/min   GFR calc Af Amer 8 (L) >60 mL/min   Anion gap 9 5 - 15    Comment: Performed at [facility], [address], [place], [place] [address]    Current Facility-Administered Medications  Medication Dose Route Frequency Provider Last Rate Last Admin  . 0.9 %  sodium chloride infusion   Intravenous Continuous Lorella Nimrod, MD   Held at 03/25/20 2300  . acetaminophen (TYLENOL) tablet 650 mg  650 mg Oral Q4H PRN Flora Lipps, MD      . Chlorhexidine Gluconate Cloth 2 % PADS 6 each  6 each Topical Daily Flora Lipps, MD   6 each at 03/27/20 [phone]  . dextrose 50 % solution 0-50 mL  0-50 mL Intravenous PRN Arta Silence, MD   50 mL at 03/26/20 2247  . docusate sodium (COLACE) capsule 100 mg  100 mg Oral BID PRN Flora Lipps, MD      . insulin aspart (novoLOG) injection 0-5 Units  0-5 Units Subcutaneous QHS Dallie Piles, RPH      . insulin aspart (novoLOG) injection 0-9 Units  0-9 Units Subcutaneous TID WC Dallie Piles, RPH   2 Units at 03/27/20 0936  . insulin aspart (novoLOG) injection 3 Units  3 Units Subcutaneous TID WC Awilda Bill, NP   3 Units at 03/27/20 [phone]  . insulin detemir (LEVEMIR) injection 10 Units  10 Units Subcutaneous Daily Dallie Piles, RPH   10 Units at 03/27/20 4081  . ondansetron (ZOFRAN) injection 4 mg  4 mg Intravenous Q6H PRN Flora Lipps, MD   4 mg at 03/26/20 2012   . pantoprazole (PROTONIX) EC tablet 40 mg  40 mg Oral BID Dallie Piles, RPH   40 mg at 03/27/20 0935  . polyethylene glycol (MIRALAX / GLYCOLAX) packet 17 g  17 g Oral Daily PRN Flora Lipps, MD      . sodium bicarbonate tablet 650 mg  650 mg Oral BID Lorella Nimrod, MD   650 mg at 03/26/20 2211    Musculoskeletal: Strength & Muscle Tone: feels somewhat weakened  Gait & Station: not known now  Patient leans:  Not known   Psychiatric Specialty Exam: Physical Exam Per ED   Review of Systems 8 systems reviewed   Blood pressure 98/80, pulse 71, temperature 98 F (36.7 C), temperature source Oral, resp. rate 16, height 5\' 8"  (1.727 m), weight 60.1 kg, SpO2 97 %.Body mass index is 20.15 kg/m.  General Appearance: somewhat tired   Eye Contact:  Fair   Speech:  Low tone volume   Volume:  Low   Mood:  Depressed   Affect:  Somewhat flat   Thought Process:  Illogical when stressed   Orientation:  Times four okay  Thought Content:  Preoccupied with depressive themes, illness   Suicidal Thoughts:  None   Homicidal Thoughts:  None      Memory:  Recent, remote and immediate intact   Judgement:  Fair to poor   Insight:  Fair to poor   Psychomotor Activity:  Slowed   Concentration:  Fair   Recall:  Normal   Fund of Knowledge:  Below average    Language:  Normal   Akathisia:  None   Handed:  Not known   AIMS (if indicated):     Assets:  Family wants to help he is willing to go to psychiatry    ADL's:  Limited  due to illness but can do basics   Cognition:  Distracted at times  Sleep:   loss of sleep in three cycles      Treatment Plan Summary:  [name] with above diagnosis still with depression and compliance issues.  He has both primary depression as well as --depression anxiety relating to his constellation of medical problems   He is willing to go to Psych inpatient after medical stabilization ----and to have his Mom help him at home with medications for his DM and  all.   Cymbalta 30 mg po daily started Remeron 15 qhs for sleep and appetite And Periactin 2 mg po tid for appetite   Disposition:   Inpatient care at [facility] health here ;  TTS assisting with transfer when medically stable     Eulas Post, MD 03/27/2020 10:35 AM

## 2020-03-27 NOTE — Progress Notes (Signed)
PROGRESS NOTE    Gregory Moody  HTD:[id number] DOB: [birth year] DOA: 03/24/2020 PCP: [facility]   Brief Narrative:  Gregory Santiago-Gonzalezis a 44 y.o.malewith medical history significant ofinsulin-dependent type 2 diabetes complicated by gastroparesis, several admissions for DKA, chronic malnutrition, history of Covid July 2020, as well as hospitalization for esophageal candidiasis, recently admitted from 4/12/2021to 01/30/2020 with chronic diarrhea and recentC. difficile colitis, undergoing colonoscopy with negative biopsies, with hospitalizationbeing complicated by acute urinary retention requiring temporary Foley placement,followed by another admission from 02/06/2020 to 02/09/2020 with similar complaints and urinary retention requiring Foley catheter.  Other admission with DKA and AKI from 5/20 till 03/02/2020. Again came with complaints of generalized weakness, lack of appetite and nausea. Found to be in DKA. Not using his insulin as directed.   Subjective: Patient has no new complaints today.  He was sitting comfortably in chair.  He did work with PT earlier today.  Assessment & Plan:   Active Problems:   DKA (diabetic ketoacidoses) ([place])  Diabetes mellitus with DKA.  DKA resolved. Patient did developed hypovolemic shock requiring pressors initially now off the pressors. -Low bicarb although improving. -Continue basal and SSI.  Patient was developing some hypoglycemia so her Lantus was decreased to 6 units. -Discontinue bicarb infusion and start him on p.o. bicarb. -Multiple similar admissions-we will try to get him charitable home health services twice which were never established as they do not pick up phone. -I consulted psych again  to see if there is any underlying cognitive issues and he is unable to take care of himself-he qualifies for inpatient behavioral health treatment for his underlying depression. -Medically stable to be transferred to  behavioral health once bed becomes available.  AKI.  Most likely secondary to uncontrolled diabetes and dehydration.  Previously his AKI responds well to Foley placement.  Patient was discharged home with Foley during prior hospitalization and advised to follow-up with urology which he never did. Some improvement in creatinine to 8.09 today. -Nephrology consult-nephrology he is stable to be transferred to behavioral health and they will follow-up there.  He will need daily BMPs. -Continue IV fluid for now and that can be discontinued when he will go to behavioral health as he is taking p.o. -Avoid nephrotoxins  Protein caloric malnutrition.  Patient is unable to take care of himself. -Dietitian consult.   Objective: Vitals:   03/26/20 2357 03/27/20 0613 03/27/20 0728 03/27/20 1139  BP: 100/75 110/83 98/80 112/87  Pulse: 77 74 71 70  Resp: 14 16 16 16   Temp: 97.7 F (36.5 C) 98.4 F (36.9 C) 98 F (36.7 C) 98.2 F (36.8 C)  TempSrc: Oral  Oral   SpO2: 99% 100% 97% 100%  Weight:      Height:        Intake/Output Summary (Last 24 hours) at 03/27/2020 1605 Last data filed at 03/26/2020 2200 Gross per 24 hour  Intake 240 ml  Output 300 ml  Net -60 ml   Filed Weights   03/24/20 1222 03/25/20 0625 03/26/20 0500  Weight: 48.1 kg 56.9 kg 60.1 kg    Examination:  General exam: Chronically ill-appearing, emaciated gentleman,appears calm and comfortable  Respiratory system: Clear to auscultation. Respiratory effort normal. Cardiovascular system: S1 & S2 heard, RRR. No JVD, murmurs, rubs, gallops or clicks. Gastrointestinal system: Soft, nontender, nondistended, bowel sounds positive. Central nervous system: Alert and oriented. No focal neurological deficits. Extremities: No edema, no cyanosis, pulses intact and symmetrical. Psychiatry: Judgement and insight appear normal.  DVT  prophylaxis: Lovenox Code Status: Full Family Communication: No family at bedside Disposition Plan:    Status is: Inpatient  Remains inpatient appropriate because:Inpatient level of care appropriate due to severity of illness   Dispo: The patient is from: Home              Anticipated d/c is to: Behavioral health              Anticipated d/c date is: 1 day              Patient currently is medically stable for transfer to behavioral health once bed becomes available.   Patient is very high risk for readmission and deterioration.  Multiple admission with similar complaints.  Does not use his insulin or any other medication at home.  Unable to establish home health services as they does not respond to phone calls.  Has no insurance.  Consultants:   Nephrology  Psychiatry  Procedures:  Antimicrobials:   Data Reviewed: I have personally reviewed following labs and imaging studies  CBC: Recent Labs  Lab 03/24/20 1226 03/24/20 1226 03/24/20 1701 03/24/20 1701 03/24/20 2100 03/24/20 2100 03/25/20 0516 03/25/20 1223 03/25/20 1811 03/25/20 2328 03/26/20 0530  WBC 4.7  --  6.9  --  4.4  --  5.8  --   --   --   --   NEUTROABS 3.7  --   --   --  3.5  --  4.3  --   --   --   --   HGB 6.3*   < > 6.7*   < > 6.1*   < > 10.4* 9.6* 9.2* 9.4* 9.8*  HCT 18.6*   < > 20.6*   < > 18.7*   < > 29.4* 27.2* 26.5* 26.5* 28.4*  MCV 91.6  --  94.9  --  94.4  --  86.7  --   --   --   --   PLT 229  --  274  --  184  --  221  --   --   --   --    < > = values in this interval not displayed.   Basic Metabolic Panel: Recent Labs  Lab 03/24/20 1539 03/24/20 1539 03/24/20 1604 03/25/20 0516 03/25/20 1223 03/26/20 0530 03/27/20 0616  NA 142  --   --  140 140 137 136  K 4.9  --   --  4.0 4.0 4.7 4.9  CL 119*  --   --  119* 117* 112* 109  CO2 9*  --   --  12* 14* 18* 18*  GLUCOSE 338*  --   --  144* 141* 169* 101*  BUN 151*  --   --  118* 113* 109* 107*  CREATININE 12.80*   < > 11.95* 8.97* 8.99* 8.46* 8.09*  CALCIUM 8.4*  --   --  7.8* 7.6* 7.8* 7.9*  MG  --   --   --  1.9  --   --   --    PHOS  --   --   --  4.2  --   --   --    < > = values in this interval not displayed.   GFR: Estimated Creatinine Clearance: 9.9 mL/min (A) (by C-G formula based on SCr of 8.09 mg/dL (H)). Liver Function Tests: Recent Labs  Lab 03/24/20 1226  AST <5*  ALT 5  ALKPHOS 54  BILITOT 0.7  PROT 5.5*  ALBUMIN 2.7*   No  results for input(s): LIPASE, AMYLASE in the last 168 hours. No results for input(s): AMMONIA in the last 168 hours. Coagulation Profile: Recent Labs  Lab 03/24/20 2100  INR 1.4*   Cardiac Enzymes: No results for input(s): CKTOTAL, CKMB, CKMBINDEX, TROPONINI in the last 168 hours. BNP (last 3 results) No results for input(s): PROBNP in the last 8760 hours. HbA1C: No results for input(s): HGBA1C in the last 72 hours. CBG: Recent Labs  Lab 03/26/20 2202 03/26/20 2241 03/26/20 2313 03/27/20 1213 03/27/20 1251  GLUCAP 47* 66* 186* 79 101*   Lipid Profile: No results for input(s): CHOL, HDL, LDLCALC, TRIG, CHOLHDL, LDLDIRECT in the last 72 hours. Thyroid Function Tests: No results for input(s): TSH, T4TOTAL, FREET4, T3FREE, THYROIDAB in the last 72 hours. Anemia Panel: No results for input(s): VITAMINB12, FOLATE, FERRITIN, TIBC, IRON, RETICCTPCT in the last 72 hours. Sepsis Labs: Recent Labs  Lab 03/24/20 1226 03/24/20 1539 03/24/20 1604 03/25/20 0516  PROCALCITON  --  0.29  --  0.18  LATICACIDVEN 0.8  --  1.2  --     Recent Results (from the past 240 hour(s))  SARS Coronavirus 2 by RT PCR (hospital order, performed in [facility] hospital lab) Nasopharyngeal Nasopharyngeal Swab     Status: None   Collection Time: 03/24/20 12:26 PM   Specimen: Nasopharyngeal Swab  Result Value Ref Range Status   SARS Coronavirus 2 NEGATIVE NEGATIVE Final    Comment: (NOTE) SARS-CoV-2 target nucleic acids are NOT DETECTED.  The SARS-CoV-2 RNA is generally detectable in upper and lower respiratory specimens during the acute phase of infection. The  lowest concentration of SARS-CoV-2 viral copies this assay can detect is 250 copies / mL. A negative result does not preclude SARS-CoV-2 infection and should not be used as the sole basis for treatment or other patient management decisions.  A negative result may occur with improper specimen collection / handling, submission of specimen other than nasopharyngeal swab, presence of viral mutation(s) within the areas targeted by this assay, and inadequate number of viral copies (<250 copies / mL). A negative result must be combined with clinical observations, patient history, and epidemiological information.  Fact Sheet for Patients:   StrictlyIdeas.no  Fact Sheet for Healthcare Providers: BankingDealers.co.za  This test is not yet approved or  cleared by the [place] FDA and has been authorized for detection and/or diagnosis of SARS-CoV-2 by FDA under an Emergency Use Authorization (EUA).  This EUA will remain in effect (meaning this test can be used) for the duration of the COVID-19 declaration under Section 564(b)(1) of the Act, 21 U.S.C. section 360bbb-3(b)(1), unless the authorization is terminated or revoked sooner.  Performed at [facility], [address] [place], [place] [address]   MRSA PCR Screening     Status: None   Collection Time: 03/25/20  6:14 AM   Specimen: Nasopharyngeal  Result Value Ref Range Status   MRSA by PCR NEGATIVE NEGATIVE Final    Comment:        The GeneXpert MRSA Assay (FDA approved for NASAL specimens only), is one component of a comprehensive MRSA colonization surveillance program. It is not intended to diagnose MRSA infection nor to guide or monitor treatment for MRSA infections. Performed at [facility], [address] [place], [place] [address]      Radiology Studies: No results found.  Scheduled Meds: . Chlorhexidine Gluconate Cloth  6 each Topical Daily  .  DULoxetine  20 mg Oral Daily  . insulin aspart  0-5 Units Subcutaneous QHS  . insulin aspart  0-9 Units Subcutaneous TID WC  . insulin aspart  3 Units Subcutaneous TID WC  . insulin detemir  6 Units Subcutaneous QHS  . mirtazapine  15 mg Oral QHS  . pantoprazole  40 mg Oral BID  . sodium bicarbonate  650 mg Oral BID   Continuous Infusions: . sodium chloride Stopped (03/25/20 2300)     LOS: 3 days   Time spent: 40 minutes.  Lorella Nimrod, MD [facility]  If 7PM-7AM, please contact night-coverage [email or web address]  03/27/2020, 4:05 PM   This record has been created using Systems analyst. Errors have been sought and corrected,but may not always be located. Such creation errors do not reflect on the standard of care.

## 2020-03-27 NOTE — TOC Initial Note (Signed)
Transition of Care [facility]) - Initial/Assessment Note    Patient Details  Name: Gregory Moody MRN: [id number] Date of Birth: Nov 17, 1975  [facility] CM/SW Contact:    Shelbie Hutching, RN Phone Number: 03/27/2020, 10:58 AM  Clinical Narrative:                 Patient has multiple admissions for DKA.  Patient is very quiet and is wrapped up in his blanket when RNCM arrives to room.  At first patient did not speak but after a few minutes he asked questions regarding information provided.  This RNCM provided patient with resources in [place].  Patient reports that sometimes he has trouble getting food, instructed patient to call DSS and inquire about food assistance.  [employer]ied churches also provides assistance with food and shelter.  Instructed patient to reach out to [facility] if he has questions, This RNCM's card left at bedside with contact information.   Expected Discharge Plan: Home/Self Care Barriers to Discharge: Continued Medical Work up   Patient Goals and CMS Choice        Expected Discharge Plan and Services Expected Discharge Plan: Home/Self Care   Discharge Planning Services: CM Consult                                          Prior Living Arrangements/Services     Patient language and need for interpreter reviewed:: Yes (Spanish is primary language but patient speaks English well) Do you feel safe going back to the place where you live?: Yes      Need for Family Participation in Patient Care: Yes (Comment) Care giver support system in place?: No (comment)   Criminal Activity/Legal Involvement Pertinent to Current Situation/Hospitalization: No - Comment as needed  Activities of Daily Living Home Assistive Devices/Equipment: None ADL Screening (condition at time of admission) Patient's cognitive ability adequate to safely complete daily activities?: Yes Is the patient deaf or have difficulty hearing?: No Does the patient have  difficulty seeing, even when wearing glasses/contacts?: No Does the patient have difficulty concentrating, remembering, or making decisions?: No Patient able to express need for assistance with ADLs?: Yes Does the patient have difficulty dressing or bathing?: No Independently performs ADLs?: No Communication: Independent Dressing (OT): Independent  Permission Sought/Granted                  Emotional Assessment Appearance:: Appears stated age Attitude/Demeanor/Rapport: Guarded, Lethargic Affect (typically observed): Quiet, Withdrawn Orientation: : Oriented to Self, Oriented to Place, Oriented to  Time, Oriented to Situation   Psych Involvement: No (comment)  Admission diagnosis:  DKA (diabetic ketoacidoses) (H[facility]) [E11.10] AKI (acute kidney injury) ([place]) [N17.9] Hypotension, unspecified hypotension type [I95.9] Diabetic ketoacidosis without coma associated with type 2 diabetes mellitus ([place]) [E11.10] Patient Active Problem List   Diagnosis Date Noted  . Hyperkalemia   . Iron deficiency anemia   . Weight loss   . Chronic diarrhea 02/07/2020  . Abnormal CT of the chest   . Acute urinary retention 02/06/2020  . Hyperglycemia due to type 2 diabetes mellitus ([place]) 02/06/2020  . History of Clostridium difficile colitis 02/06/2020  . Generalized weakness 02/06/2020  . History of COVID-19 02/06/2020  . Diarrhea 01/21/2020  . Tobacco abuse 01/21/2020  . C. difficile colitis 01/21/2020  . Urinary retention 01/21/2020  . Diabetes mellitus without complication ([place]) 79/39/0300  . Protein-calorie malnutrition, severe  01/02/2020  . Odynophagia   . DKA (diabetic ketoacidoses) ([place]) 12/27/2019  . Depression 12/27/2019  . DKA, type 2 ([place]) 12/27/2019  . Anemia of chronic disease 04/25/2019  . Hypomagnesemia 04/25/2019  . Hypotension 04/23/2019  . CAP (community acquired pneumonia) due to MSSA (methicillin sensitive Staphylococcus aureus) ([place]) 04/14/2019  . COVID-19 virus  infection 04/14/2019  . Pressure injury of skin 03/31/2019  . Acute renal failure (ARF) ([place]) 03/06/2019  . CAP (community acquired pneumonia) 02/14/2019  . AKI (acute kidney injury) ([place]) 02/02/2019  . ARF (acute renal failure) ([place]) 01/03/2019  . Malnutrition of moderate degree 02/24/2018  . GERD (gastroesophageal reflux disease) 02/23/2018  . Diabetic gastroparesis ([place]) 02/23/2018  . Diabetic foot ulcer ([place]) 02/23/2018  . Aspiration pneumonia ([place]) 04/21/2017  . Hypoglycemia 04/21/2017  . Diabetes mellitus with hyperglycemia ([place]) 04/21/2017  . Hip fracture, unspecified laterality, closed, initial encounter ([place]) 04/17/2017  . Hyperglycemia 04/17/2017  . Malnutrition ([place]) 04/09/2016  . Cavitary lesion of lung 04/06/2016   PCP:  [facility]:   [place], [place] - [address] [place] [address] Phone: [phone] Fax: [phone]  [place] [address]), [place] [address] [place]) [place] [address] Phone: [phone] Fax: [phone]  [facility] [place], [address] [place] [address] Phone: [phone] Fax: [phone]     Social Determinants of Health (SDOH) Interventions    Readmission Risk Interventions Readmission Risk Prevention Plan 02/29/2020 04/19/2019 03/07/2019  Transportation Screening Complete Complete Complete  PCP or Specialist Appt within 3-5 Days - - Complete  Social Work Consult for [place] Planning/Counseling - - (No Data)  Palliative Care Screening - - Not Applicable  Medication Review Press photographer) Complete Complete Complete  PCP or Specialist appointment within 3-5 days of discharge Complete Complete -  [place] or Home Care Consult Complete Complete -  SW Recovery Care/Counseling Consult - Complete -  Palliative Care Screening - Not  Applicable -  [place] Complete Not Applicable -  Some recent data might be hidden

## 2020-03-28 LAB — BASIC METABOLIC PANEL
Anion gap: 8 (ref 5–15)
BUN: 97 mg/dL — ABNORMAL HIGH (ref 6–20)
CO2: 17 mmol/L — ABNORMAL LOW (ref 22–32)
Calcium: 7.6 mg/dL — ABNORMAL LOW (ref 8.9–10.3)
Chloride: 107 mmol/L (ref 98–111)
Creatinine, Ser: 7.88 mg/dL — ABNORMAL HIGH (ref 0.61–1.24)
GFR calc Af Amer: 9 mL/min — ABNORMAL LOW (ref >60)
GFR calc non Af Amer: 8 mL/min — ABNORMAL LOW (ref >60)
Glucose, Bld: 96 mg/dL (ref 70–99)
Potassium: 4.8 mmol/L (ref 3.5–5.1)
Sodium: 132 mmol/L — ABNORMAL LOW (ref 135–145)

## 2020-03-28 LAB — GLUCOSE, CAPILLARY
Glucose-Capillary: 40 mg/dL — CL (ref 70–99)
Glucose-Capillary: 50 mg/dL — ABNORMAL LOW (ref 70–99)
Glucose-Capillary: 138 mg/dL — ABNORMAL HIGH (ref 70–99)
Glucose-Capillary: 56 mg/dL — ABNORMAL LOW (ref 70–99)
Glucose-Capillary: 87 mg/dL (ref 70–99)
Glucose-Capillary: 124 mg/dL — ABNORMAL HIGH (ref 70–99)
Glucose-Capillary: 139 mg/dL — ABNORMAL HIGH (ref 70–99)

## 2020-03-28 LAB — PHOSPHORUS: Phosphorus: 6.1 mg/dL — ABNORMAL HIGH (ref 2.5–4.6)

## 2020-03-28 MED ORDER — INSULIN ASPART 100 UNIT/ML ~~LOC~~ SOLN: 0.0000 [IU] | Freq: Every day | SUBCUTANEOUS | 11 refills | Status: DC

## 2020-03-28 MED ORDER — SODIUM BICARBONATE 650 MG PO TABS: 650.0000 mg | ORAL_TABLET | Freq: Three times a day (TID) | ORAL | Status: DC

## 2020-03-28 MED ORDER — INSULIN DETEMIR 100 UNIT/ML ~~LOC~~ SOLN: 6.0000 [IU] | Freq: Every day | SUBCUTANEOUS | 11 refills | Status: DC

## 2020-03-28 MED ORDER — INSULIN ASPART 100 UNIT/ML ~~LOC~~ SOLN
2.0000 [IU] | Freq: Three times a day (TID) | SUBCUTANEOUS | Status: DC
  Administered 2020-03-30 – 2020-03-31 (×4): 2 [IU] via SUBCUTANEOUS
  Filled 2020-03-28 (×4): qty 1

## 2020-03-28 MED ORDER — MIRTAZAPINE 15 MG PO TBDP: 15.0000 mg | ORAL_TABLET | Freq: Every day | ORAL | Status: DC

## 2020-03-28 MED ORDER — POLYETHYLENE GLYCOL 3350 17 G PO PACK: 17.0000 g | PACK | Freq: Every day | ORAL | 0 refills | Status: DC | PRN

## 2020-03-28 MED ORDER — INSULIN ASPART 100 UNIT/ML ~~LOC~~ SOLN: 2.0000 [IU] | Freq: Three times a day (TID) | SUBCUTANEOUS | 11 refills | Status: DC

## 2020-03-28 MED ORDER — SODIUM BICARBONATE 650 MG PO TABS
650.0000 mg | ORAL_TABLET | Freq: Three times a day (TID) | ORAL | Status: DC
  Administered 2020-03-29: 650 mg via ORAL
  Filled 2020-03-28 (×4): qty 1

## 2020-03-28 MED ORDER — PANTOPRAZOLE SODIUM 40 MG PO TBEC: 40.0000 mg | DELAYED_RELEASE_TABLET | Freq: Two times a day (BID) | ORAL | Status: DC

## 2020-03-28 MED ORDER — DOCUSATE SODIUM 100 MG PO CAPS: 100.0000 mg | ORAL_CAPSULE | Freq: Two times a day (BID) | ORAL | 0 refills | Status: DC | PRN

## 2020-03-28 MED ORDER — INSULIN ASPART 100 UNIT/ML ~~LOC~~ SOLN: 0.0000 [IU] | Freq: Three times a day (TID) | SUBCUTANEOUS | 11 refills | Status: DC

## 2020-03-28 MED ORDER — DULOXETINE HCL 20 MG PO CPEP: 20.0000 mg | ORAL_CAPSULE | Freq: Every day | ORAL | 3 refills | Status: DC

## 2020-03-28 NOTE — Progress Notes (Signed)
Called to [facility]. Nurse unavailable. Request return of call to [phone]

## 2020-03-28 NOTE — Progress Notes (Signed)
[address] [place], [place] [address] Phone [phone]. Fax [phone]  Date: 03/28/2020                  Patient Name:  Gregory Moody  MRN: [id number]  DOB: Aug 19, 1976  Age / Sex: 44 y.o., male         PCP: [place]                 Service Requesting Consult: IM/ Lorella Nimrod, MD                 Reason for Consult: ARF            History of Present Illness: Patient is a 44 y.o. male with medical problems of insulin-dependent diabetes (diagnosed at age 79)  with complications of gastroparesis, neuropathy, several admissions for DKA, chronic malnutrition, history of Covid infection July 2020, hospitalization for esophageal candidiasis, chronic diarrhea, recent C. difficile colitis, history of urinary retention, who was admitted to [facility] on 03/24/2020 for evaluation of generalized weakness, lack of appetite, nausea.  He was diagnosed with diabetic acid acidosis and admitted to ICU for further management  Nephrology consult has been requested for evaluation and management of acute kidney injury.  Patient has a baseline creatinine of 1.12 from Mar 02, 2020. Admission creatinine of 12.8 on June 14.    Resting quietly today States that he has been able to eat without nausea or vomiting No leg edema No shortness of breath   Current medications: Current Facility-Administered Medications  Medication Dose Route Frequency Provider Last Rate Last Admin   0.9 %  sodium chloride infusion   Intravenous Continuous Lorella Nimrod, MD   Held at 03/25/20 2300   acetaminophen (TYLENOL) tablet 650 mg  650 mg Oral Q4H PRN Flora Lipps, MD       Chlorhexidine Gluconate Cloth 2 % PADS 6 each  6 each Topical Daily Flora Lipps, MD   6 each at 03/28/20 0729   dextrose 50 % solution 0-50 mL  0-50 mL Intravenous PRN Arta Silence, MD   50 mL at 03/26/20 2247   docusate sodium (COLACE) capsule 100 mg  100 mg Oral BID PRN Flora Lipps, MD        DULoxetine (CYMBALTA) DR capsule 20 mg  20 mg Oral Daily Eulas Post, MD   20 mg at 03/28/20 0729   insulin aspart (novoLOG) injection 0-5 Units  0-5 Units Subcutaneous QHS Dallie Piles, RPH       insulin aspart (novoLOG) injection 0-9 Units  0-9 Units Subcutaneous TID WC Dallie Piles, RPH   2 Units at 03/27/20 0936   insulin aspart (novoLOG) injection 3 Units  3 Units Subcutaneous TID WC Awilda Bill, NP   3 Units at 03/27/20 0937   insulin detemir (LEVEMIR) injection 6 Units  6 Units Subcutaneous QHS Lorella Nimrod, MD       mirtazapine (REMERON SOL-TAB) disintegrating tablet 15 mg  15 mg Oral QHS Eulas Post, MD   15 mg at 03/27/20 2221   ondansetron (ZOFRAN) injection 4 mg  4 mg Intravenous Q6H PRN Flora Lipps, MD   4 mg at 03/27/20 1956   pantoprazole (PROTONIX) EC tablet 40 mg  40 mg Oral BID Dallie Piles, RPH   40 mg at 03/28/20 0729   polyethylene glycol (MIRALAX / GLYCOLAX) packet 17 g  17 g Oral Daily PRN Flora Lipps, MD       sodium bicarbonate tablet 650 mg  650 mg Oral TID Lorella Nimrod, MD          Vital Signs: Blood pressure 115/89, pulse 72, temperature 98.4 F (36.9 C), temperature source Oral, resp. rate 16, height 5\' 8"  (1.727 m), weight 60.1 kg, SpO2 99 %.   Intake/Output Summary (Last 24 hours) at 03/28/2020 1020 Last data filed at 03/28/2020 0616 Gross per 24 hour  Intake --  Output 2500 ml  Net -2500 ml    Weight trends: Filed Weights   03/24/20 1222 03/25/20 0625 03/26/20 0500  Weight: 48.1 kg 56.9 kg 60.1 kg    Physical Exam: General:  Thin, cachectic appearing gentleman, laying in the bed  HEENT  moist oral mucous membranes  Neck:  Supple, no masses  Lungs:  Normal breathing effort on room air  Heart::  Tachycardic no rub  Abdomen:  Soft, nontender  Extremities:  Loss of muscle mass noted, no edema  Neurologic:  Alert, able to answer simple questions  Skin:  Skin skin breakdown and scrapings over knees and elbows   Access:   Foley:  In place       Lab results: Basic Metabolic Panel: Recent Labs  Lab 03/25/20 0516 03/25/20 1223 03/26/20 0530 03/27/20 0616 03/28/20 0451  NA 140   < > 137 136 132*  K 4.0   < > 4.7 4.9 4.8  CL 119*   < > 112* 109 107  CO2 12*   < > 18* 18* 17*  GLUCOSE 144*   < > 169* 101* 96  BUN 118*   < > 109* 107* 97*  CREATININE 8.97*   < > 8.46* 8.09* 7.88*  CALCIUM 7.8*   < > 7.8* 7.9* 7.6*  MG 1.9  --   --   --   --   PHOS 4.2  --   --   --  6.1*   < > = values in this interval not displayed.    Liver Function Tests: Recent Labs  Lab 03/24/20 1226  AST <5*  ALT 5  ALKPHOS 54  BILITOT 0.7  PROT 5.5*  ALBUMIN 2.7*   No results for input(s): LIPASE, AMYLASE in the last 168 hours. No results for input(s): AMMONIA in the last 168 hours.  CBC: Recent Labs  Lab 03/24/20 2100 03/24/20 2100 03/25/20 0516 03/25/20 1223 03/25/20 2328 03/26/20 0530  WBC 4.4  --  5.8  --   --   --   NEUTROABS 3.5  --  4.3  --   --   --   HGB 6.1*   < > 10.4*   < > 9.4* 9.8*  HCT 18.7*   < > 29.4*   < > 26.5* 28.4*  MCV 94.4  --  86.7  --   --   --   PLT 184  --  221  --   --   --    < > = values in this interval not displayed.    Cardiac Enzymes: No results for input(s): CKTOTAL, TROPONINI in the last 168 hours.  BNP: Invalid input(s): POCBNP  CBG: Recent Labs  Lab 03/27/20 2055 03/28/20 0105 03/28/20 0130 03/28/20 0201 03/28/20 0752  GLUCAP 81 50* 56* 138* 52    Microbiology: Recent Results (from the past 720 hour(s))  SARS Coronavirus 2 by RT PCR (hospital order, performed in [facility] hospital lab) Nasopharyngeal Nasopharyngeal Swab     Status: None   Collection Time: 02/28/20  6:18 PM   Specimen: Nasopharyngeal Swab  Result Value  Ref Range Status   SARS Coronavirus 2 NEGATIVE NEGATIVE Final    Comment: (NOTE) SARS-CoV-2 target nucleic acids are NOT DETECTED. The SARS-CoV-2 RNA is generally detectable in upper and lower respiratory specimens  during the acute phase of infection. The lowest concentration of SARS-CoV-2 viral copies this assay can detect is 250 copies / mL. A negative result does not preclude SARS-CoV-2 infection and should not be used as the sole basis for treatment or other patient management decisions.  A negative result may occur with improper specimen collection / handling, submission of specimen other than nasopharyngeal swab, presence of viral mutation(s) within the areas targeted by this assay, and inadequate number of viral copies (<250 copies / mL). A negative result must be combined with clinical observations, patient history, and epidemiological information. Fact Sheet for Patients:   StrictlyIdeas.no Fact Sheet for Healthcare Providers: BankingDealers.co.za This test is not yet approved or cleared  by the [place] FDA and has been authorized for detection and/or diagnosis of SARS-CoV-2 by FDA under an Emergency Use Authorization (EUA).  This EUA will remain in effect (meaning this test can be used) for the duration of the COVID-19 declaration under Section 564(b)(1) of the Act, 21 U.S.C. section 360bbb-3(b)(1), unless the authorization is terminated or revoked sooner. Performed at [facility], [address] [place], [place] [address]   SARS Coronavirus 2 by RT PCR (hospital order, performed in [facility] hospital lab) Nasopharyngeal Nasopharyngeal Swab     Status: None   Collection Time: 03/24/20 12:26 PM   Specimen: Nasopharyngeal Swab  Result Value Ref Range Status   SARS Coronavirus 2 NEGATIVE NEGATIVE Final    Comment: (NOTE) SARS-CoV-2 target nucleic acids are NOT DETECTED.  The SARS-CoV-2 RNA is generally detectable in upper and lower respiratory specimens during the acute phase of infection. The lowest concentration of SARS-CoV-2 viral copies this assay can detect is 250 copies / mL. A negative result does not preclude  SARS-CoV-2 infection and should not be used as the sole basis for treatment or other patient management decisions.  A negative result may occur with improper specimen collection / handling, submission of specimen other than nasopharyngeal swab, presence of viral mutation(s) within the areas targeted by this assay, and inadequate number of viral copies (<250 copies / mL). A negative result must be combined with clinical observations, patient history, and epidemiological information.  Fact Sheet for Patients:   StrictlyIdeas.no  Fact Sheet for Healthcare Providers: BankingDealers.co.za  This test is not yet approved or  cleared by the [place] FDA and has been authorized for detection and/or diagnosis of SARS-CoV-2 by FDA under an Emergency Use Authorization (EUA).  This EUA will remain in effect (meaning this test can be used) for the duration of the COVID-19 declaration under Section 564(b)(1) of the Act, 21 U.S.C. section 360bbb-3(b)(1), unless the authorization is terminated or revoked sooner.  Performed at [facility], [address] [place],  [address]   MRSA PCR Screening     Status: None   Collection Time: 03/25/20  6:14 AM   Specimen: Nasopharyngeal  Result Value Ref Range Status   MRSA by PCR NEGATIVE NEGATIVE Final    Comment:        The GeneXpert MRSA Assay (FDA approved for NASAL specimens only), is one component of a comprehensive MRSA colonization surveillance program. It is not intended to diagnose MRSA infection nor to guide or monitor treatment for MRSA infections. Performed at [facility], [address] [place], [place] [address]      Coagulation Studies: No results for input(s): LABPROT, INR in the last 72 hours.  Urinalysis: No results for input(s): COLORURINE, LABSPEC, PHURINE, GLUCOSEU, HGBUR, BILIRUBINUR, KETONESUR, PROTEINUR, UROBILINOGEN, NITRITE, LEUKOCYTESUR in  the last 72 hours.  Invalid input(s): APPERANCEUR      Imaging: No results found.   Assessment & Plan: Pt is a 44 y.o. Hispanic  male with Diabetes, insulin-dependent.  Diagnosed at age 68. Diabetic complications of gastroparesis, neuropathy, Several hospitalizations for DKA History of esophageal candidiasis Chronic diarrhea History of C. difficile colitis H/o Quaniferon + in 2017   was admitted on 03/24/2020 with DKA (diabetic ketoacidoses) ([place]) [E11.10] AKI (acute kidney injury) ([place]) [N17.9] Hypotension, unspecified hypotension type [I95.9] Diabetic ketoacidosis without coma associated with type 2 diabetes mellitus ([place]) [E11.10]  #Acute kidney injury Baseline creatinine 1.12/GFR > 60 AKI likely pre-renal and ATN due to hemodynamic shifts and acute illness  Serum creatinine trends are as follows.  Creatinine is improving slowly Patient is nonoliguric 06/17 0701 - 06/18 0700 In: -  Out: 2500 [Urine:2500]   Lab Results  Component Value Date   CREATININE 7.88 (H) 03/28/2020   CREATININE 8.09 (H) 03/27/2020   CREATININE 8.46 (H) 03/26/2020    Electrolytes and Volume status are acceptable No acute indication for Dialysis at present Will continue to follow  # Diabetic ketoacidosis Management as per hospitalist team  continue sodium bicarbonate supplementation  #Major depression Transferring to inpatient psychiatry   May d/c IV fluid supplementation prior to transfer         LOS: [place] 6/18/202110:20 AM    Note: This note was prepared with Dragon dictation. Any transcription errors are unintentional

## 2020-03-28 NOTE — BH Assessment (Signed)
Patient is to be admitted to [facility] BMU by Dr. Dr. Janese Banks.  Attending Physician will be Dr. Weber Cooks.   Patient has been assigned to room 324, by [facility] Charge Nurse Demetria.   Intake Paper Work has been signed and placed on patient chart.

## 2020-03-28 NOTE — Progress Notes (Signed)
Inpatient Diabetes Program Recommendations  AACE/ADA: New Consensus Statement on Inpatient Glycemic Control (2015)  Target Ranges:  Prepandial:   less than 140 mg/dL      Peak postprandial:   less than 180 mg/dL (1-2 hours)      Critically ill patients:  140 - 180 mg/dL   Lab Results  Component Value Date   GLUCAP 87 03/28/2020   HGBA1C 10.8 (H) 02/07/2020    Review of Glycemic Control Results for Gregory Moody, Gregory Moody (MRN [id number]) as of 03/28/2020 11:27  Ref. Range 03/27/2020 12:13 03/27/2020 12:51 03/27/2020 16:30 03/27/2020 17:06 03/27/2020 17:53 03/27/2020 20:55 03/28/2020 01:05 03/28/2020 01:30 03/28/2020 02:01 03/28/2020 07:52  Glucose-Capillary Latest Ref Range: 70 - 99 mg/dL 79 101 (H) 49 (L) 66 (L) 79 81 50 (L) 56 (L) 138 (H) 87    Diabetes history:DM2 Outpatient Diabetes medications:70/30 30 units BID Current orders for Inpatient glycemic control: Levemir6 units daily, Novolog 3 units TID with meals, Novolog 0-9 units TID with meals, Novolog 0-5 units QHS  Inpatient Diabetes Program Recommendations:   Please consider reducing Novolog meal coverage to 2 units tid with meals and reduce Novolog correction to very sensitive.   Thanks  Adah Perl, RN, BC-ADM Inpatient Diabetes Coordinator Pager [phone] (8a-5p)

## 2020-03-28 NOTE — Discharge Summary (Addendum)
Physician Discharge Summary  Gregory Moody DPO:[id number] DOB: 07-31-1976 DOA: 03/24/2020  PCP: [place] date: 03/24/2020 Discharge date: 03/29/2020  Admitted From: Home Disposition: Behavioral health  Recommendations for Outpatient Follow-up:  1. Follow up with PCP in 1-2 weeks 2. Please obtain BMP/CBC in one week 3. Please follow up on the following pending results: None  Home Health: No Equipment/Devices: None  Discharge Condition: Stable CODE STATUS: Full Diet recommendation: Heart Healthy / Carb Modified   Brief/Interim Summary: Gregory Santiago-Gonzalezis a 44 y.o.malewith medical history significant ofinsulin-dependent type 2 diabetes complicated by gastroparesis, several admissions for DKA, chronic malnutrition, history of Covid July 2020, as well as hospitalization for esophageal candidiasis, recently admitted from 4/12/2021to 01/30/2020 with chronic diarrhea and recentC. difficile colitis, undergoing colonoscopy with negative biopsies, with hospitalizationbeing complicated by acute urinary retention requiring temporary Foley placement,followed by another admission from 02/06/2020 to 02/09/2020 with similar complaints and urinary retention requiring Foley catheter.  Other admission with DKA and AKI from 5/20 till 03/02/2020. Again came with complaints of generalized weakness, lack of appetite and nausea. Found to be in DKA. Not using his insulin as directed.  He was also hypotensive on admission requiring pressors.  His DKA was initially treated with insulin infusion, later transitioned to basal and SSI.  We consulted psych again to see any underlying psych condition which is causing him not to take care of himself and requiring multiple admissions.  We even tried previously twice to get him a charitable home health services but no one picked up phone from his home.  There was a note that he was being kicked out of the house from his parent as he  was drinking.  Per patient he stopped drinking now.  Psych evaluated him and he qualifies for inpatient management for his underlying depression.  Patient was stable to discharge to behavioral health once bed is available.  He will be discharged on Lantus 6 units at bedtime, NovoLog 2 units with meals if he eats more than 50% along with sensitive SSI if needed.  Patient did develop multiple episodes of hypoglycemia most likely secondary to worsening renal function and retention of insulin.  He will required frequent CBG monitoring and adjustment of his insulin if needed.  Diabetic coordinator can be consulted from behavioral health facility to facilitate changes if needed for his insulin requirement.  Patient developed AKI.Most likely secondary to uncontrolled diabetes and dehydration.  Previously his AKI responds well to Foley placement.  Patient was discharged home with Foley during prior hospitalization and advised to follow-up with urology which he never did.  Current hospitalization his renal function shows a very slow response.  Continue to improve but very slowly.  His creatinine was 7.88 on the day of discharge.  Nephrology was following and they are agreeable to continue following over behavioral health.  He will need a repeat daily BMP during his stay there.  Patient has persistent nonanion gap metabolic acidosis secondary to renal disease.  He was initially managed with bicarb infusion.  Later transitioned to bicarb tablets.  Dose increased on 03/29/2020.  Patient has moderate protein caloric malnutrition.  Dietitian was consulted and his diet was adjusted accordingly.  He will need supplements in order to complete his dietary requirements.  Patient also found to have stage II pressure ulcers.  Wound care was consulted and he will need continuation of wound care management for his ulcerations.  Pressure Injury 12/28/19 Elbow Left Stage 2 -  Partial thickness loss of  dermis presenting as a  shallow open injury with a red, pink wound bed without slough. (Active)  12/28/19 1422  Location: Elbow  Location Orientation: Left  Staging: Stage 2 -  Partial thickness loss of dermis presenting as a shallow open injury with a red, pink wound bed without slough.  Wound Description (Comments):   Present on Admission: Yes (Present on transfer to 1A)     Pressure Injury 12/28/19 Elbow Right Stage 3 -  Full thickness tissue loss. Subcutaneous fat may be visible but bone, tendon or muscle are NOT exposed. (Active)  12/28/19 1422  Location: Elbow  Location Orientation: Right  Staging: Stage 3 -  Full thickness tissue loss. Subcutaneous fat may be visible but bone, tendon or muscle are NOT exposed.  Wound Description (Comments):   Present on Admission: Yes (Present on transfer to 1A)     Pressure Injury 02/07/20 (Active)  02/07/20 1411  Location:   Location Orientation:   Staging:   Wound Description (Comments):   Present on Admission: Yes     Pressure Injury 03/25/20 Hip Bilateral Stage 2 -  Partial thickness loss of dermis presenting as a shallow open injury with a red, pink wound bed without slough. (Active)  03/25/20 1900  Location: Hip  Location Orientation: Bilateral  Staging: Stage 2 -  Partial thickness loss of dermis presenting as a shallow open injury with a red, pink wound bed without slough.  Wound Description (Comments):   Present on Admission: Yes     Pressure Injury 03/25/20 Buttocks Bilateral Stage 2 -  Partial thickness loss of dermis presenting as a shallow open injury with a red, pink wound bed without slough. (Active)  03/25/20 1900  Location: Buttocks  Location Orientation: Bilateral  Staging: Stage 2 -  Partial thickness loss of dermis presenting as a shallow open injury with a red, pink wound bed without slough.  Wound Description (Comments):   Present on Admission: Yes   Discharge Diagnoses:  Active Problems:   DKA (diabetic ketoacidoses)  [facility])  Discharge Instructions  Discharge Instructions    Diet - low sodium heart healthy   Complete by: As directed    Discharge wound care:   Complete by: As directed    Continue wound care according to wound care nurse recommendations.   Increase activity slowly   Complete by: As directed      Allergies as of 03/29/2020   No Known Allergies     Medication List    STOP taking these medications   FLUoxetine 10 MG capsule Commonly known as: PROZAC   insulin NPH-regular Human (70-30) 100 UNIT/ML injection   sucralfate 1 g tablet Commonly known as: CARAFATE     TAKE these medications   aspirin EC 81 MG tablet Take 81 mg by mouth daily.   colesevelam 625 MG tablet Commonly known as: WELCHOL Take 3 tablets (1,875 mg total) by mouth 2 (two) times daily with a meal.   docusate sodium 100 MG capsule Commonly known as: COLACE Take 1 capsule (100 mg total) by mouth 2 (two) times daily as needed for mild constipation.   DULoxetine 20 MG capsule Commonly known as: CYMBALTA Take 1 capsule (20 mg total) by mouth daily.   Ensure Max Protein Liqd Take 330 mLs (11 oz total) by mouth 2 (two) times daily.   insulin aspart 100 UNIT/ML injection Commonly known as: novoLOG Inject 0-5 Units into the skin at bedtime.   insulin aspart 100 UNIT/ML injection Commonly known as: novoLOG Inject  0-9 Units into the skin 3 (three) times daily with meals.   insulin aspart 100 UNIT/ML injection Commonly known as: novoLOG Inject 2 Units into the skin 3 (three) times daily with meals.   insulin detemir 100 UNIT/ML injection Commonly known as: LEVEMIR Inject 0.06 mLs (6 Units total) into the skin at bedtime.   liver oil-zinc oxide 40 % ointment Commonly known as: DESITIN Apply topically 2 (two) times daily as needed for irritation.   loperamide 2 MG capsule Commonly known as: IMODIUM Take 1 capsule (2 mg total) by mouth 2 (two) times daily as needed for diarrhea or loose stools.    mirtazapine 15 MG disintegrating tablet Commonly known as: REMERON SOL-TAB Take 1 tablet (15 mg total) by mouth at bedtime.   multivitamin with minerals Tabs tablet Take 1 tablet by mouth daily.   pantoprazole 40 MG tablet Commonly known as: PROTONIX Take 1 tablet (40 mg total) by mouth 2 (two) times daily.   polyethylene glycol 17 g packet Commonly known as: MIRALAX / GLYCOLAX Take 17 g by mouth daily as needed for moderate constipation.   sodium bicarbonate 650 MG tablet Take 2 tablets (1,300 mg total) by mouth 3 (three) times daily.   tamsulosin 0.4 MG Caps capsule Commonly known as: FLOMAX Take 1 capsule (0.4 mg total) by mouth daily after breakfast.            Discharge Care Instructions  (From admission, onward)         Start     Ordered   03/28/20 0000  Discharge wound care:       Comments: Continue wound care according to wound care nurse recommendations.   03/28/20 1708          No Known Allergies  Consultations:  Nephrology  Procedures/Studies: US RENAL  Result Date: 03/25/2020 CLINICAL DATA:  Acute renal failure. EXAM: RENAL / URINARY TRACT ULTRASOUND COMPLETE COMPARISON:  None. FINDINGS: Right Kidney: Renal measurements: 12.5 x 5.2 x 5.7 cm = volume: 196 mL. Markedly increased parenchymal echogenicity demonstrated. No mass or hydronephrosis visualized. Mild right perinephric fluid is noted as well as mild ascites. Left Kidney: Renal measurements: 11.7 x 5.5 x 5.4 cm = volume: 182 mL. Markedly increased parenchymal echogenicity demonstrated. No mass or hydronephrosis visualized. Mild left perinephric fluid noted as well as mild ascites. Bladder: Foley catheter is seen in place. Diffuse bladder wall thickening is seen, even allowing for incomplete bladder distension. Other: None. IMPRESSION: Normal size kidneys with markedly increased parenchymal echogenicity, consistent with medical renal disease. No evidence of renal mass or hydronephrosis. Mild  bilateral perinephric fluid, and mild ascites. Diffuse bladder wall thickening, with differential diagnosis including cystitis and chronic bladder outlet obstruction. Electronically Signed   By: Marlaine Hind M.D.   On: 03/25/2020 09:18   DG Chest Port 1 View  Result Date: 03/25/2020 CLINICAL DATA:  Initial evaluation for interval changes. EXAM: PORTABLE CHEST 1 VIEW COMPARISON:  Prior radiograph from 03/24/2020. FINDINGS: Cardiac and mediastinal silhouettes are stable, and remain within normal limits. Lungs are hypoinflated. Interval development of mild diffuse pulmonary vascular and interstitial congestion, increased from previous. No frank pulmonary edema. No pleural effusion. Linear atelectatic changes present along the right minor fissure. Superimposed right upper lobe nodular density, stable from previous. Osseous structures and soft tissues are unchanged. IMPRESSION: 1. Interval development of mild diffuse pulmonary vascular and interstitial congestion, increased from previous. No frank pulmonary edema. 2. Stable right upper lobe nodular density. Electronically Signed   By: [name]  Jeannine Boga M.D.   On: 03/25/2020 03:53   DG Chest Portable 1 View  Result Date: 03/24/2020 CLINICAL DATA:  Weakness.  Hyperglycemia. EXAM: PORTABLE CHEST 1 VIEW COMPARISON:  Two-view chest x-ray 02/07/2020 FINDINGS: Heart size normal. Nodular density in right upper lobe is again seen. Lower lobe airspace disease is improving. Some residual right lower lobe airspace disease is noted. IMPRESSION: 1. Improving right lower lobe airspace disease. 2. Persistent nodular density in the right upper lobe. Electronically Signed   By: San Morelle M.D.   On: 03/24/2020 13:54   DG Chest Portable 1 View  Result Date: 02/28/2020 CLINICAL DATA:  Weakness. Body aches. Patient has history of COVID and tuberculosis infection. EXAM: PORTABLE CHEST 1 VIEW COMPARISON:  Most recent radiograph 02/07/2020. Most recent CT 12/29/2019,  chest CT 04/06/2016 also reviewed FINDINGS: Chronic airspace opacity in the perifissural right upper lobe is likely post infectious scarring, and has been present over multiple prior exams. The previous nodular opacities in the left lung have improved, with mild streaky residual at the left lung base, partially obscured by overlying EKG leads. No evidence of new or acute airspace disease. Normal heart size and mediastinal contours. No pulmonary edema, pneumothorax, or large pleural effusion. Multiple overlying monitoring devices in place. IMPRESSION: 1. No acute abnormality. 2. Chronic airspace opacity in the perifissural right upper lobe is likely related to scarring. This has been present over multiple prior exams. 3. Previous nodular opacities in the left lung have improved, with mild streaky residual at the left lung base. Electronically Signed   By: Keith Rake M.D.   On: 02/28/2020 15:47    Subjective: Has no new complaints today.  Discharge Exam: Vitals:   03/29/20 0011 03/29/20 0534  BP: 111/80 109/85  Pulse: 74 77  Resp:  16  Temp: (!) 97.5 F (36.4 C) 97.7 F (36.5 C)  SpO2: 99% 98%   Vitals:   03/28/20 1927 03/29/20 0011 03/29/20 0500 03/29/20 0534  BP: (!) 132/104 111/80  109/85  Pulse: 77 74  77  Resp: 16   16  Temp: 97.8 F (36.6 C) (!) 97.5 F (36.4 C)  97.7 F (36.5 C)  TempSrc: Oral Axillary  Oral  SpO2: 99% 99%  98%  Weight:   60.9 kg   Height:        General: Pt is alert, awake, not in acute distress Cardiovascular: RRR, S1/S2 +, no rubs, no gallops Respiratory: CTA bilaterally, no wheezing, no rhonchi Abdominal: Soft, NT, ND, bowel sounds + Extremities: no edema, no cyanosis   The results of significant diagnostics from this hospitalization (including imaging, microbiology, ancillary and laboratory) are listed below for reference.    Microbiology: Recent Results (from the past 240 hour(s))  SARS Coronavirus 2 by RT PCR (hospital order, performed in  [facility] hospital lab) Nasopharyngeal Nasopharyngeal Swab     Status: None   Collection Time: 03/24/20 12:26 PM   Specimen: Nasopharyngeal Swab  Result Value Ref Range Status   SARS Coronavirus 2 NEGATIVE NEGATIVE Final    Comment: (NOTE) SARS-CoV-2 target nucleic acids are NOT DETECTED.  The SARS-CoV-2 RNA is generally detectable in upper and lower respiratory specimens during the acute phase of infection. The lowest concentration of SARS-CoV-2 viral copies this assay can detect is 250 copies / mL. A negative result does not preclude SARS-CoV-2 infection and should not be used as the sole basis for treatment or other patient management decisions.  A negative result may occur with improper specimen collection /  handling, submission of specimen other than nasopharyngeal swab, presence of viral mutation(s) within the areas targeted by this assay, and inadequate number of viral copies (<250 copies / mL). A negative result must be combined with clinical observations, patient history, and epidemiological information.  Fact Sheet for Patients:   StrictlyIdeas.no  Fact Sheet for Healthcare Providers: BankingDealers.co.za  This test is not yet approved or  cleared by the [place] FDA and has been authorized for detection and/or diagnosis of SARS-CoV-2 by FDA under an Emergency Use Authorization (EUA).  This EUA will remain in effect (meaning this test can be used) for the duration of the COVID-19 declaration under Section 564(b)(1) of the Act, 21 U.S.C. section 360bbb-3(b)(1), unless the authorization is terminated or revoked sooner.  Performed at [facility], [address] [place], [place] [address]   MRSA PCR Screening     Status: None   Collection Time: 03/25/20  6:14 AM   Specimen: Nasopharyngeal  Result Value Ref Range Status   MRSA by PCR NEGATIVE NEGATIVE Final    Comment:        The GeneXpert MRSA Assay  (FDA approved for NASAL specimens only), is one component of a comprehensive MRSA colonization surveillance program. It is not intended to diagnose MRSA infection nor to guide or monitor treatment for MRSA infections. Performed at [facility], [address] [place], [place] [address]      Labs: BNP (last 3 results) Recent Labs    04/19/19 0300 04/20/19 0500 04/20/19 1108  BNP 450.9* SPECIMEN(S) LOST IN TRANSIT DUPLICATE  277.8*   Basic Metabolic Panel: Recent Labs  Lab 03/25/20 0516 03/25/20 0516 03/25/20 1223 03/26/20 0530 03/27/20 0616 03/28/20 0451 03/29/20 0614  NA 140   < > 140 137 136 132* 132*  K 4.0   < > 4.0 4.7 4.9 4.8 4.8  CL 119*   < > 117* 112* 109 107 106  CO2 12*   < > 14* 18* 18* 17* 16*  GLUCOSE 144*   < > 141* 169* 101* 96 153*  BUN 118*   < > 113* 109* 107* 97* 100*  CREATININE 8.97*   < > 8.99* 8.46* 8.09* 7.88* 7.53*  CALCIUM 7.8*   < > 7.6* 7.8* 7.9* 7.6* 7.9*  MG 1.9  --   --   --   --   --   --   PHOS 4.2  --   --   --   --  6.1*  --    < > = values in this interval not displayed.   Liver Function Tests: Recent Labs  Lab 03/24/20 1226  AST <5*  ALT 5  ALKPHOS 54  BILITOT 0.7  PROT 5.5*  ALBUMIN 2.7*   No results for input(s): LIPASE, AMYLASE in the last 168 hours. No results for input(s): AMMONIA in the last 168 hours. CBC: Recent Labs  Lab 03/24/20 1226 03/24/20 1226 03/24/20 1701 03/24/20 1701 03/24/20 2100 03/24/20 2100 03/25/20 0516 03/25/20 1223 03/25/20 1811 03/25/20 2328 03/26/20 0530  WBC 4.7  --  6.9  --  4.4  --  5.8  --   --   --   --   NEUTROABS 3.7  --   --   --  3.5  --  4.3  --   --   --   --   HGB 6.3*   < > 6.7*   < > 6.1*   < > 10.4* 9.6* 9.2* 9.4* 9.8*  HCT  18.6*   < > 20.6*   < > 18.7*   < > 29.4* 27.2* 26.5* 26.5* 28.4*  MCV 91.6  --  94.9  --  94.4  --  86.7  --   --   --   --   PLT 229  --  274  --  184  --  221  --   --   --   --    < > = values in this interval not displayed.    Cardiac Enzymes: No results for input(s): CKTOTAL, CKMB, CKMBINDEX, TROPONINI in the last 168 hours. BNP: Invalid input(s): POCBNP CBG: Recent Labs  Lab 03/28/20 0130 03/28/20 0201 03/28/20 0752 03/28/20 1152 03/28/20 1650  GLUCAP 56* 138* 87 124* 139*   D-Dimer No results for input(s): DDIMER in the last 72 hours. Hgb A1c No results for input(s): HGBA1C in the last 72 hours. Lipid Profile No results for input(s): CHOL, HDL, LDLCALC, TRIG, CHOLHDL, LDLDIRECT in the last 72 hours. Thyroid function studies No results for input(s): TSH, T4TOTAL, T3FREE, THYROIDAB in the last 72 hours.  Invalid input(s): FREET3 Anemia work up No results for input(s): VITAMINB12, FOLATE, FERRITIN, TIBC, IRON, RETICCTPCT in the last 72 hours. Urinalysis    Component Value Date/Time   COLORURINE YELLOW (A) 03/24/2020 1701   APPEARANCEUR CLOUDY (A) 03/24/2020 1701   LABSPEC 1.010 03/24/2020 1701   PHURINE 5.0 03/24/2020 1701   GLUCOSEU >=500 (A) 03/24/2020 1701   HGBUR MODERATE (A) 03/24/2020 1701   BILIRUBINUR NEGATIVE 03/24/2020 1701   KETONESUR NEGATIVE 03/24/2020 1701   PROTEINUR 100 (A) 03/24/2020 1701   NITRITE NEGATIVE 03/24/2020 1701   LEUKOCYTESUR LARGE (A) 03/24/2020 1701   Sepsis Labs Invalid input(s): PROCALCITONIN,  WBC,  LACTICIDVEN Microbiology Recent Results (from the past 240 hour(s))  SARS Coronavirus 2 by RT PCR (hospital order, performed in [place] hospital lab) Nasopharyngeal Nasopharyngeal Swab     Status: None   Collection Time: 03/24/20 12:26 PM   Specimen: Nasopharyngeal Swab  Result Value Ref Range Status   SARS Coronavirus 2 NEGATIVE NEGATIVE Final    Comment: (NOTE) SARS-CoV-2 target nucleic acids are NOT DETECTED.  The SARS-CoV-2 RNA is generally detectable in upper and lower respiratory specimens during the acute phase of infection. The lowest concentration of SARS-CoV-2 viral copies this assay can detect is 250 copies / mL. A negative result does  not preclude SARS-CoV-2 infection and should not be used as the sole basis for treatment or other patient management decisions.  A negative result may occur with improper specimen collection / handling, submission of specimen other than nasopharyngeal swab, presence of viral mutation(s) within the areas targeted by this assay, and inadequate number of viral copies (<250 copies / mL). A negative result must be combined with clinical observations, patient history, and epidemiological information.  Fact Sheet for Patients:   StrictlyIdeas.no  Fact Sheet for Healthcare Providers: BankingDealers.co.za  This test is not yet approved or  cleared by the [place] FDA and has been authorized for detection and/or diagnosis of SARS-CoV-2 by FDA under an Emergency Use Authorization (EUA).  This EUA will remain in effect (meaning this test can be used) for the duration of the COVID-19 declaration under Section 564(b)(1) of the Act, 21 U.S.C. section 360bbb-3(b)(1), unless the authorization is terminated or revoked sooner.  Performed at [facility], [address], [place], [place] [address]   MRSA PCR Screening     Status: None   Collection Time: 03/25/20  6:14  AM   Specimen: Nasopharyngeal  Result Value Ref Range Status   MRSA by PCR NEGATIVE NEGATIVE Final    Comment:        The GeneXpert MRSA Assay (FDA approved for NASAL specimens only), is one component of a comprehensive MRSA colonization surveillance program. It is not intended to diagnose MRSA infection nor to guide or monitor treatment for MRSA infections. Performed at [facility], [address] [place], [place] [address]     Time coordinating discharge: Over 30 minutes  SIGNED:  Lorella Nimrod, MD  [facility] 03/29/2020, 7:33 AM  If 7PM-7AM, please contact night-coverage [email or web address]  This record has been created using Therapist, sports. Errors have been sought and corrected,but may not always be located. Such creation errors do not reflect on the standard of care.

## 2020-03-28 NOTE — Progress Notes (Signed)
PROGRESS NOTE    Gregory Moody  ZOX:[id number] DOB: 1976-04-22 DOA: 03/24/2020 PCP: [place]   Brief Narrative:  Gregory Santiago-Gonzalezis a 44 y.o.malewith medical history significant ofinsulin-dependent type 2 diabetes complicated by gastroparesis, several admissions for DKA, chronic malnutrition, history of Covid July 2020, as well as hospitalization for esophageal candidiasis, recently admitted from 4/12/2021to 01/30/2020 with chronic diarrhea and recentC. difficile colitis, undergoing colonoscopy with negative biopsies, with hospitalizationbeing complicated by acute urinary retention requiring temporary Foley placement,followed by another admission from 02/06/2020 to 02/09/2020 with similar complaints and urinary retention requiring Foley catheter.  Other admission with DKA and AKI from 5/20 till 03/02/2020. Again came with complaints of generalized weakness, lack of appetite and nausea. Found to be in DKA. Not using his insulin as directed.   Subjective: Patient has no new complaints today.  He was sleeping and refused to talk when I tried to wake him up.  Assessment & Plan:   Active Problems:   DKA (diabetic ketoacidoses) ([place])  Diabetes mellitus with DKA.  DKA resolved. Patient did developed hypovolemic shock requiring pressors initially now off the pressors. -Low bicarb although improving. -Multiple similar admissions-we will try to get him charitable home health services twice which were never established as they do not pick up phone. -I consulted psych again  to see if there is any underlying cognitive issues and he is unable to take care of himself-he qualifies for inpatient behavioral health treatment for his underlying depression. -Continue basal and SSI.  Patient was developing some hypoglycemia so her Lantus was decreased to 6 units.  I decrease mealtime coverage to 2 units today. -Medically stable to be transferred to behavioral health once  bed becomes available.  AKI.  Most likely secondary to uncontrolled diabetes and dehydration.  Previously his AKI responds well to Foley placement.  Patient was discharged home with Foley during prior hospitalization and advised to follow-up with urology which he never did. Some improvement in creatinine to 7.88 today. -Nephrology consult-nephrology he is stable to be transferred to behavioral health and they will follow-up there.  He will need daily BMPs. -Avoid nephrotoxins  Protein caloric malnutrition.  Patient is unable to take care of himself. -Dietitian consult.   Objective: Vitals:   03/27/20 2005 03/28/20 0415 03/28/20 0751 03/28/20 1153  BP: 115/89 120/89 115/89 (!) 124/91  Pulse: 77 73 72 84  Resp: 17 16 16 16   Temp: 97.9 F (36.6 C) 97.8 F (36.6 C) 98.4 F (36.9 C) 98 F (36.7 C)  TempSrc: Oral Oral Oral   SpO2: 97% 99% 99% 100%  Weight:      Height:        Intake/Output Summary (Last 24 hours) at 03/28/2020 1634 Last data filed at 03/28/2020 1042 Gross per 24 hour  Intake 240 ml  Output 2500 ml  Net -2260 ml   Filed Weights   03/24/20 1222 03/25/20 0625 03/26/20 0500  Weight: 48.1 kg 56.9 kg 60.1 kg    Examination:  General exam: Chronically ill-appearing, emaciated gentleman,appears calm and comfortable  Respiratory system: Clear to auscultation. Respiratory effort normal. Cardiovascular system: S1 & S2 heard, RRR. No JVD, murmurs, rubs, gallops or clicks. Gastrointestinal system: Soft, nontender, nondistended, bowel sounds positive. Central nervous system: Alert and oriented. No focal neurological deficits. Extremities: No edema, no cyanosis, pulses intact and symmetrical. Psychiatry: Judgement and insight appear normal.  DVT prophylaxis: Lovenox Code Status: Full Family Communication: No family at bedside Disposition Plan:  Status is: Inpatient  Remains inpatient appropriate  because:Inpatient level of care appropriate due to severity of  illness   Dispo: The patient is from: Home              Anticipated d/c is to: Behavioral health              Anticipated d/c date is: 1 day              Patient currently is medically stable for transfer to behavioral health once bed becomes available.   Patient is very high risk for readmission and deterioration.  Multiple admission with similar complaints.  Does not use his insulin or any other medication at home.  Unable to establish home health services as they does not respond to phone calls.  Has no insurance.  Consultants:   Nephrology  Psychiatry  Procedures:  Antimicrobials:   Data Reviewed: I have personally reviewed following labs and imaging studies  CBC: Recent Labs  Lab 03/24/20 1226 03/24/20 1226 03/24/20 1701 03/24/20 1701 03/24/20 2100 03/24/20 2100 03/25/20 0516 03/25/20 1223 03/25/20 1811 03/25/20 2328 03/26/20 0530  WBC 4.7  --  6.9  --  4.4  --  5.8  --   --   --   --   NEUTROABS 3.7  --   --   --  3.5  --  4.3  --   --   --   --   HGB 6.3*   < > 6.7*   < > 6.1*   < > 10.4* 9.6* 9.2* 9.4* 9.8*  HCT 18.6*   < > 20.6*   < > 18.7*   < > 29.4* 27.2* 26.5* 26.5* 28.4*  MCV 91.6  --  94.9  --  94.4  --  86.7  --   --   --   --   PLT 229  --  274  --  184  --  221  --   --   --   --    < > = values in this interval not displayed.   Basic Metabolic Panel: Recent Labs  Lab 03/25/20 0516 03/25/20 1223 03/26/20 0530 03/27/20 0616 03/28/20 0451  NA 140 140 137 136 132*  K 4.0 4.0 4.7 4.9 4.8  CL 119* 117* 112* 109 107  CO2 12* 14* 18* 18* 17*  GLUCOSE 144* 141* 169* 101* 96  BUN 118* 113* 109* 107* 97*  CREATININE 8.97* 8.99* 8.46* 8.09* 7.88*  CALCIUM 7.8* 7.6* 7.8* 7.9* 7.6*  MG 1.9  --   --   --   --   PHOS 4.2  --   --   --  6.1*   GFR: Estimated Creatinine Clearance: 10.2 mL/min (A) (by C-G formula based on SCr of 7.88 mg/dL (H)). Liver Function Tests: Recent Labs  Lab 03/24/20 1226  AST <5*  ALT 5  ALKPHOS 54  BILITOT 0.7  PROT  5.5*  ALBUMIN 2.7*   No results for input(s): LIPASE, AMYLASE in the last 168 hours. No results for input(s): AMMONIA in the last 168 hours. Coagulation Profile: Recent Labs  Lab 03/24/20 2100  INR 1.4*   Cardiac Enzymes: No results for input(s): CKTOTAL, CKMB, CKMBINDEX, TROPONINI in the last 168 hours. BNP (last 3 results) No results for input(s): PROBNP in the last 8760 hours. HbA1C: No results for input(s): HGBA1C in the last 72 hours. CBG: Recent Labs  Lab 03/28/20 0105 03/28/20 0130 03/28/20 0201 03/28/20 0752 03/28/20 1152  GLUCAP 50* 56* 138* 87 124*   Lipid  Profile: No results for input(s): CHOL, HDL, LDLCALC, TRIG, CHOLHDL, LDLDIRECT in the last 72 hours. Thyroid Function Tests: No results for input(s): TSH, T4TOTAL, FREET4, T3FREE, THYROIDAB in the last 72 hours. Anemia Panel: No results for input(s): VITAMINB12, FOLATE, FERRITIN, TIBC, IRON, RETICCTPCT in the last 72 hours. Sepsis Labs: Recent Labs  Lab 03/24/20 1226 03/24/20 1539 03/24/20 1604 03/25/20 0516  PROCALCITON  --  0.29  --  0.18  LATICACIDVEN 0.8  --  1.2  --     Recent Results (from the past 240 hour(s))  SARS Coronavirus 2 by RT PCR (hospital order, performed in [facility] hospital lab) Nasopharyngeal Nasopharyngeal Swab     Status: None   Collection Time: 03/24/20 12:26 PM   Specimen: Nasopharyngeal Swab  Result Value Ref Range Status   SARS Coronavirus 2 NEGATIVE NEGATIVE Final    Comment: (NOTE) SARS-CoV-2 target nucleic acids are NOT DETECTED.  The SARS-CoV-2 RNA is generally detectable in upper and lower respiratory specimens during the acute phase of infection. The lowest concentration of SARS-CoV-2 viral copies this assay can detect is 250 copies / mL. A negative result does not preclude SARS-CoV-2 infection and should not be used as the sole basis for treatment or other patient management decisions.  A negative result may occur with improper specimen collection / handling,  submission of specimen other than nasopharyngeal swab, presence of viral mutation(s) within the areas targeted by this assay, and inadequate number of viral copies (<250 copies / mL). A negative result must be combined with clinical observations, patient history, and epidemiological information.  Fact Sheet for Patients:   StrictlyIdeas.no  Fact Sheet for Healthcare Providers: BankingDealers.co.za  This test is not yet approved or  cleared by the [place] FDA and has been authorized for detection and/or diagnosis of SARS-CoV-2 by FDA under an Emergency Use Authorization (EUA).  This EUA will remain in effect (meaning this test can be used) for the duration of the COVID-19 declaration under Section 564(b)(1) of the Act, 21 U.S.C. section 360bbb-3(b)(1), unless the authorization is terminated or revoked sooner.  Performed at [facility], [address] [place], [place] [address]   MRSA PCR Screening     Status: None   Collection Time: 03/25/20  6:14 AM   Specimen: Nasopharyngeal  Result Value Ref Range Status   MRSA by PCR NEGATIVE NEGATIVE Final    Comment:        The GeneXpert MRSA Assay (FDA approved for NASAL specimens only), is one component of a comprehensive MRSA colonization surveillance program. It is not intended to diagnose MRSA infection nor to guide or monitor treatment for MRSA infections. Performed at [facility], [address] [place], [place] [address]      Radiology Studies: No results found.  Scheduled Meds: . Chlorhexidine Gluconate Cloth  6 each Topical Daily  . DULoxetine  20 mg Oral Daily  . insulin aspart  0-5 Units Subcutaneous QHS  . insulin aspart  0-9 Units Subcutaneous TID WC  . insulin aspart  2 Units Subcutaneous TID WC  . insulin detemir  6 Units Subcutaneous QHS  . mirtazapine  15 mg Oral QHS  . pantoprazole  40 mg Oral BID  . sodium bicarbonate  650 mg Oral  TID   Continuous Infusions: . sodium chloride Stopped (03/25/20 2300)     LOS: 4 days   Time spent: 40 minutes.  Lorella Nimrod, MD [facility]  If 7PM-7AM, please contact night-coverage [email or web address]  03/28/2020, 4:34 PM  This record has been created using Systems analyst. Errors have been sought and corrected,but may not always be located. Such creation errors do not reflect on the standard of care.

## 2020-03-29 LAB — BASIC METABOLIC PANEL
Anion gap: 10 (ref 5–15)
BUN: 100 mg/dL — ABNORMAL HIGH (ref 6–20)
CO2: 16 mmol/L — ABNORMAL LOW (ref 22–32)
Calcium: 7.9 mg/dL — ABNORMAL LOW (ref 8.9–10.3)
Chloride: 106 mmol/L (ref 98–111)
Creatinine, Ser: 7.53 mg/dL — ABNORMAL HIGH (ref 0.61–1.24)
GFR calc Af Amer: 9 mL/min — ABNORMAL LOW (ref >60)
GFR calc non Af Amer: 8 mL/min — ABNORMAL LOW (ref >60)
Glucose, Bld: 153 mg/dL — ABNORMAL HIGH (ref 70–99)
Potassium: 4.8 mmol/L (ref 3.5–5.1)
Sodium: 132 mmol/L — ABNORMAL LOW (ref 135–145)

## 2020-03-29 LAB — GLUCOSE, CAPILLARY
Glucose-Capillary: 154 mg/dL — ABNORMAL HIGH (ref 70–99)
Glucose-Capillary: 178 mg/dL — ABNORMAL HIGH (ref 70–99)
Glucose-Capillary: 215 mg/dL — ABNORMAL HIGH (ref 70–99)
Glucose-Capillary: 190 mg/dL — ABNORMAL HIGH (ref 70–99)

## 2020-03-29 MED ORDER — TAMSULOSIN HCL 0.4 MG PO CAPS
0.4000 mg | ORAL_CAPSULE | Freq: Every day | ORAL | Status: DC
  Administered 2020-03-29 – 2020-03-31 (×3): 0.4 mg via ORAL
  Filled 2020-03-29 (×3): qty 1

## 2020-03-29 MED ORDER — SODIUM BICARBONATE 650 MG PO TABS
1300.0000 mg | ORAL_TABLET | Freq: Three times a day (TID) | ORAL | Status: DC
  Administered 2020-03-29 – 2020-03-31 (×9): 1300 mg via ORAL
  Filled 2020-03-29 (×11): qty 2

## 2020-03-29 MED ORDER — SODIUM BICARBONATE 650 MG PO TABS: 1300.0000 mg | ORAL_TABLET | Freq: Three times a day (TID) | ORAL | Status: DC

## 2020-03-29 NOTE — Progress Notes (Signed)
Spoke with charge RN Megan on [facility]; who confirms the behavioral unit is unable to take a patient with a foley (chronic or acute) and would likely be unable to take this patient regardless r/t his refusal to take insulin at this time. Wrter left voicemail with case manager to call back regarding this patient's situation.

## 2020-03-29 NOTE — Progress Notes (Signed)
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 difficile colitis, undergoing colonoscopy with negative biopsies, with hospitalizationbeing complicated by acute urinary retention requiring temporary Foley placement,followed by another admission from 02/06/2020 to 02/09/2020 with similar complaints and urinary retention requiring Foley catheter.  Other admission with DKA and AKI from 5/20 till 03/02/2020. Again came with complaints of generalized weakness, lack of appetite and nausea. Found to be in DKA. Not using his insulin as directed.   Subjective: Patient has no new complaints today.  Eating his lunch when seen today.  Assessment & Plan:   Active Problems:   DKA (diabetic ketoacidoses) ([place])  Diabetes mellitus with DKA.  DKA resolved. Patient did developed hypovolemic shock requiring pressors initially now off the pressors. -Low bicarb although improving. -Multiple similar admissions-we will try to get him charitable home health services twice which were never established as they do not pick up phone. -I consulted psych again  to see if there is any underlying cognitive issues and he is unable to take care of himself-he qualifies for inpatient behavioral health treatment for his underlying depression. -Continue basal and SSI.  Patient was developing some hypoglycemia so her Lantus was decreased to 6 units.  I decrease mealtime coverage to 2 units today. -Medically stable to be transferred to behavioral health once bed becomes available. -Was  supposed to go to behavioral health last night, they refused to take him as he has a chronic Foley catheter.  AKI.  Most likely secondary to uncontrolled diabetes and dehydration.  Previously his AKI responds well to Foley placement.  Patient was discharged home with Foley during prior hospitalization and advised to follow-up with urology which he never did. Some improvement in creatinine to 7.88 today. -Nephrology consult-nephrology he is stable to be transferred to behavioral health and they will follow-up there.  He will need daily BMPs. -Avoid nephrotoxins -We will try taking Foley catheter out today and give him a voiding trial if he fails that then we need to have Foley catheter reinserted and then he will be discharged home as behavioral health refused to take him with Foley. Will remain very high risk for readmission.  Protein caloric malnutrition.  Patient is unable to take care of himself. -Dietitian consult.   Objective: Vitals:   03/29/20 0500 03/29/20 0534 03/29/20 0731 03/29/20 1112  BP:  109/85 (!) 124/91 (!) 129/96  Pulse:  77 76 71  Resp:  16 17 17   Temp:  97.7 F (36.5 C) 97.8 F (36.6 C) 97.6 F (36.4 C)  TempSrc:  Oral Oral Oral  SpO2:  98% 98% 100%  Weight: 60.9 kg     Height:        Intake/Output Summary (Last 24 hours) at 03/29/2020 1506 Last data filed at 03/29/2020 1434 Gross per 24 hour  Intake 480 ml  Output 1275 ml  Net -795 ml   Filed Weights   03/25/20 0625 03/26/20 0500 03/29/20 0500  Weight: 56.9 kg 60.1 kg 60.9 kg    Examination:  General exam: Chronically ill-appearing, emaciated gentleman,appears calm and comfortable  Respiratory system: Clear to auscultation. Respiratory effort normal. Cardiovascular system: S1 & S2 heard, RRR. No JVD,  murmurs, rubs, gallops or clicks. Gastrointestinal system: Soft, nontender, nondistended, bowel sounds positive. Central nervous system: Alert and oriented. No focal neurological deficits. Extremities:  No edema, no cyanosis, pulses intact and symmetrical. Psychiatry: Judgement and insight appear normal.  DVT prophylaxis: Lovenox Code Status: Full Family Communication: No family at bedside Disposition Plan:  Status is: Inpatient  Remains inpatient appropriate because:Inpatient level of care appropriate due to severity of illness   Dispo: The patient is from: Home              Anticipated d/c is to: Behavioral health              Anticipated d/c date is: 1 day              Patient currently is medically stable for transfer to behavioral health once bed becomes available.   Patient is very high risk for readmission and deterioration.  Multiple admission with similar complaints.  Does not use his insulin or any other medication at home.  Unable to establish home health services as they does not respond to phone calls.  Has no insurance.  Behavioral health refused to take him as he has a chronic Foley catheter.  We are giving him a voiding trial today and see how he responds.  If he fails that, he will need Foley again.  He will remain very high risk for readmission but we have to discharge him home then.  Consultants:   Nephrology  Psychiatry  Procedures:  Antimicrobials:   Data Reviewed: I have personally reviewed following labs and imaging studies  CBC: Recent Labs  Lab 03/24/20 1226 03/24/20 1226 03/24/20 1701 03/24/20 1701 03/24/20 2100 03/24/20 2100 03/25/20 0516 03/25/20 1223 03/25/20 1811 03/25/20 2328 03/26/20 0530  WBC 4.7  --  6.9  --  4.4  --  5.8  --   --   --   --   NEUTROABS 3.7  --   --   --  3.5  --  4.3  --   --   --   --   HGB 6.3*   < > 6.7*   < > 6.1*   < > 10.4* 9.6* 9.2* 9.4* 9.8*  HCT 18.6*   < > 20.6*   < > 18.7*   < > 29.4* 27.2* 26.5* 26.5* 28.4*  MCV 91.6  --  94.9  --  94.4  --  86.7  --   --   --   --   PLT 229  --  274  --  184  --  221  --   --   --   --    < > = values in this interval not displayed.   Basic Metabolic Panel: Recent  Labs  Lab 03/25/20 0516 03/25/20 0516 03/25/20 1223 03/26/20 0530 03/27/20 0616 03/28/20 0451 03/29/20 0614  NA 140   < > 140 137 136 132* 132*  K 4.0   < > 4.0 4.7 4.9 4.8 4.8  CL 119*   < > 117* 112* 109 107 106  CO2 12*   < > 14* 18* 18* 17* 16*  GLUCOSE 144*   < > 141* 169* 101* 96 153*  BUN 118*   < > 113* 109* 107* 97* 100*  CREATININE 8.97*   < > 8.99* 8.46* 8.09* 7.88* 7.53*  CALCIUM 7.8*   < > 7.6* 7.8* 7.9* 7.6* 7.9*  MG 1.9  --   --   --   --   --   --  PHOS 4.2  --   --   --   --  6.1*  --    < > = values in this interval not displayed.   GFR: Estimated Creatinine Clearance: 10.8 mL/min (A) (by C-G formula based on SCr of 7.53 mg/dL (H)). Liver Function Tests: Recent Labs  Lab 03/24/20 1226  AST <5*  ALT 5  ALKPHOS 54  BILITOT 0.7  PROT 5.5*  ALBUMIN 2.7*   No results for input(s): LIPASE, AMYLASE in the last 168 hours. No results for input(s): AMMONIA in the last 168 hours. Coagulation Profile: Recent Labs  Lab 03/24/20 2100  INR 1.4*   Cardiac Enzymes: No results for input(s): CKTOTAL, CKMB, CKMBINDEX, TROPONINI in the last 168 hours. BNP (last 3 results) No results for input(s): PROBNP in the last 8760 hours. HbA1C: No results for input(s): HGBA1C in the last 72 hours. CBG: Recent Labs  Lab 03/28/20 0752 03/28/20 1152 03/28/20 1650 03/29/20 0741 03/29/20 1143  GLUCAP 87 124* 139* 154* 178*   Lipid Profile: No results for input(s): CHOL, HDL, LDLCALC, TRIG, CHOLHDL, LDLDIRECT in the last 72 hours. Thyroid Function Tests: No results for input(s): TSH, T4TOTAL, FREET4, T3FREE, THYROIDAB in the last 72 hours. Anemia Panel: No results for input(s): VITAMINB12, FOLATE, FERRITIN, TIBC, IRON, RETICCTPCT in the last 72 hours. Sepsis Labs: Recent Labs  Lab 03/24/20 1226 03/24/20 1539 03/24/20 1604 03/25/20 0516  PROCALCITON  --  0.29  --  0.18  LATICACIDVEN 0.8  --  1.2  --     Recent Results (from the past 240 hour(s))  SARS  Coronavirus 2 by RT PCR (hospital order, performed in [facility] hospital lab) Nasopharyngeal Nasopharyngeal Swab     Status: None   Collection Time: 03/24/20 12:26 PM   Specimen: Nasopharyngeal Swab  Result Value Ref Range Status   SARS Coronavirus 2 NEGATIVE NEGATIVE Final    Comment: (NOTE) SARS-CoV-2 target nucleic acids are NOT DETECTED.  The SARS-CoV-2 RNA is generally detectable in upper and lower respiratory specimens during the acute phase of infection. The lowest concentration of SARS-CoV-2 viral copies this assay can detect is 250 copies / mL. A negative result does not preclude SARS-CoV-2 infection and should not be used as the sole basis for treatment or other patient management decisions.  A negative result may occur with improper specimen collection / handling, submission of specimen other than nasopharyngeal swab, presence of viral mutation(s) within the areas targeted by this assay, and inadequate number of viral copies (<250 copies / mL). A negative result must be combined with clinical observations, patient history, and epidemiological information.  Fact Sheet for Patients:   StrictlyIdeas.no  Fact Sheet for Healthcare Providers: BankingDealers.co.za  This test is not yet approved or  cleared by the [place] FDA and has been authorized for detection and/or diagnosis of SARS-CoV-2 by FDA under an Emergency Use Authorization (EUA).  This EUA will remain in effect (meaning this test can be used) for the duration of the COVID-19 declaration under Section 564(b)(1) of the Act, 21 U.S.C. section 360bbb-3(b)(1), unless the authorization is terminated or revoked sooner.  Performed at [facility], [address] [place], [place] [address]   MRSA PCR Screening     Status: None   Collection Time: 03/25/20  6:14 AM   Specimen: Nasopharyngeal  Result Value Ref Range Status   MRSA by PCR NEGATIVE NEGATIVE  Final    Comment:        The GeneXpert MRSA Assay (FDA approved  for NASAL specimens only), is one component of a comprehensive MRSA colonization surveillance program. It is not intended to diagnose MRSA infection nor to guide or monitor treatment for MRSA infections. Performed at [facility], [address] [place], [place] [address]      Radiology Studies: No results found.  Scheduled Meds: . Chlorhexidine Gluconate Cloth  6 each Topical Daily  . DULoxetine  20 mg Oral Daily  . insulin aspart  0-5 Units Subcutaneous QHS  . insulin aspart  0-9 Units Subcutaneous TID WC  . insulin aspart  2 Units Subcutaneous TID WC  . insulin detemir  6 Units Subcutaneous QHS  . mirtazapine  15 mg Oral QHS  . pantoprazole  40 mg Oral BID  . sodium bicarbonate  1,300 mg Oral TID  . tamsulosin  0.4 mg Oral QPC supper   Continuous Infusions: . sodium chloride Stopped (03/25/20 2300)     LOS: 5 days   Time spent: 40 minutes.  Lorella Nimrod, MD [facility]  If 7PM-7AM, please contact night-coverage [email or web address]  03/29/2020, 3:06 PM   This record has been created using Systems analyst. Errors have been sought and corrected,but may not always be located. Such creation errors do not reflect on the standard of care.

## 2020-03-29 NOTE — Progress Notes (Signed)
[facility]  ROUNDING NOTE   Subjective:  Patient is making urine at the moment. Urine output was 750 cc of the preceding 24 hours. BUN currently 100 with a creatinine of 7.53.   Objective:  Vital signs in last 24 hours:  Temp:  [97.5 F (36.4 C)-97.8 F (36.6 C)] 97.6 F (36.4 C) (06/19 1112) Pulse Rate:  [71-77] 71 (06/19 1112) Resp:  [16-17] 17 (06/19 1112) BP: (109-132)/(80-104) 129/96 (06/19 1112) SpO2:  [98 %-100 %] 100 % (06/19 1112) Weight:  [60.9 kg] 60.9 kg (06/19 0500)  Weight change:  Filed Weights   03/25/20 0625 03/26/20 0500 03/29/20 0500  Weight: 56.9 kg 60.1 kg 60.9 kg    Intake/Output: I/O last 3 completed shifts: In: 240 [P.O.:240] Out: 1950 [Urine:1950]   Intake/Output this shift:  No intake/output data recorded.  Physical Exam: General: No acute distress  Head: Normocephalic, atraumatic. Moist oral mucosal membranes  Eyes: Anicteric  Neck: Supple, trachea midline  Lungs:  Clear to auscultation, normal effort  Heart: S1S2 no rubs  Abdomen:  Soft, nontender, bowel sounds present  Extremities: No peripheral edema.  Neurologic: Awake, alert, following commands  Skin: No rashes noted       Basic Metabolic Panel: Recent Labs  Lab 03/25/20 0516 03/25/20 0516 03/25/20 1223 03/25/20 1223 03/26/20 0530 03/26/20 0530 03/27/20 0616 03/28/20 0451 03/29/20 0614  NA 140   < > 140  --  137  --  136 132* 132*  K 4.0   < > 4.0  --  4.7  --  4.9 4.8 4.8  CL 119*   < > 117*  --  112*  --  109 107 106  CO2 12*   < > 14*  --  18*  --  18* 17* 16*  GLUCOSE 144*   < > 141*  --  169*  --  101* 96 153*  BUN 118*   < > 113*  --  109*  --  107* 97* 100*  CREATININE 8.97*   < > 8.99*  --  8.46*  --  8.09* 7.88* 7.53*  CALCIUM 7.8*   < > 7.6*   < > 7.8*   < > 7.9* 7.6* 7.9*  MG 1.9  --   --   --   --   --   --   --   --   PHOS 4.2  --   --   --   --   --   --  6.1*  --    < > = values in this interval not displayed.    Liver Function  Tests: Recent Labs  Lab 03/24/20 1226  AST <5*  ALT 5  ALKPHOS 54  BILITOT 0.7  PROT 5.5*  ALBUMIN 2.7*   No results for input(s): LIPASE, AMYLASE in the last 168 hours. No results for input(s): AMMONIA in the last 168 hours.  CBC: Recent Labs  Lab 03/24/20 1226 03/24/20 1226 03/24/20 1701 03/24/20 1701 03/24/20 2100 03/24/20 2100 03/25/20 0516 03/25/20 1223 03/25/20 1811 03/25/20 2328 03/26/20 0530  WBC 4.7  --  6.9  --  4.4  --  5.8  --   --   --   --   NEUTROABS 3.7  --   --   --  3.5  --  4.3  --   --   --   --   HGB 6.3*   < > 6.7*   < > 6.1*   < > 10.4* 9.6*  9.2* 9.4* 9.8*  HCT 18.6*   < > 20.6*   < > 18.7*   < > 29.4* 27.2* 26.5* 26.5* 28.4*  MCV 91.6  --  94.9  --  94.4  --  86.7  --   --   --   --   PLT 229  --  274  --  184  --  221  --   --   --   --    < > = values in this interval not displayed.    Cardiac Enzymes: No results for input(s): CKTOTAL, CKMB, CKMBINDEX, TROPONINI in the last 168 hours.  BNP: Invalid input(s): POCBNP  CBG: Recent Labs  Lab 03/28/20 0752 03/28/20 1152 03/28/20 1650 03/29/20 0741 03/29/20 1143  GLUCAP 87 124* 139* 154* 178*    Microbiology: Results for orders placed or performed during the hospital encounter of 03/24/20  SARS Coronavirus 2 by RT PCR (hospital order, performed in [facility] hospital lab) Nasopharyngeal Nasopharyngeal Swab     Status: None   Collection Time: 03/24/20 12:26 PM   Specimen: Nasopharyngeal Swab  Result Value Ref Range Status   SARS Coronavirus 2 NEGATIVE NEGATIVE Final    Comment: (NOTE) SARS-CoV-2 target nucleic acids are NOT DETECTED.  The SARS-CoV-2 RNA is generally detectable in upper and lower respiratory specimens during the acute phase of infection. The lowest concentration of SARS-CoV-2 viral copies this assay can detect is 250 copies / mL. A negative result does not preclude SARS-CoV-2 infection and should not be used as the sole basis for treatment or other patient  management decisions.  A negative result may occur with improper specimen collection / handling, submission of specimen other than nasopharyngeal swab, presence of viral mutation(s) within the areas targeted by this assay, and inadequate number of viral copies (<250 copies / mL). A negative result must be combined with clinical observations, patient history, and epidemiological information.  Fact Sheet for Patients:   StrictlyIdeas.no  Fact Sheet for Healthcare Providers: BankingDealers.co.za  This test is not yet approved or  cleared by the [place] FDA and has been authorized for detection and/or diagnosis of SARS-CoV-2 by FDA under an Emergency Use Authorization (EUA).  This EUA will remain in effect (meaning this test can be used) for the duration of the COVID-19 declaration under Section 564(b)(1) of the Act, 21 U.S.C. section 360bbb-3(b)(1), unless the authorization is terminated or revoked sooner.  Performed at [facility], [address] [place], [place] [address]   MRSA PCR Screening     Status: None   Collection Time: 03/25/20  6:14 AM   Specimen: Nasopharyngeal  Result Value Ref Range Status   MRSA by PCR NEGATIVE NEGATIVE Final    Comment:        The GeneXpert MRSA Assay (FDA approved for NASAL specimens only), is one component of a comprehensive MRSA colonization surveillance program. It is not intended to diagnose MRSA infection nor to guide or monitor treatment for MRSA infections. Performed at [facility], [address] [place], [place] [address]     Coagulation Studies: No results for input(s): LABPROT, INR in the last 72 hours.  Urinalysis: No results for input(s): COLORURINE, LABSPEC, PHURINE, GLUCOSEU, HGBUR, BILIRUBINUR, KETONESUR, PROTEINUR, UROBILINOGEN, NITRITE, LEUKOCYTESUR in the last 72 hours.  Invalid input(s): APPERANCEUR    Imaging: No results  found.   Medications:   . sodium chloride Stopped (03/25/20 2300)   . Chlorhexidine Gluconate Cloth  6 each Topical Daily  . DULoxetine  20 mg Oral Daily  . insulin aspart  0-5 Units Subcutaneous QHS  . insulin aspart  0-9 Units Subcutaneous TID WC  . insulin aspart  2 Units Subcutaneous TID WC  . insulin detemir  6 Units Subcutaneous QHS  . mirtazapine  15 mg Oral QHS  . pantoprazole  40 mg Oral BID  . sodium bicarbonate  1,300 mg Oral TID  . tamsulosin  0.4 mg Oral QPC supper   acetaminophen, dextrose, docusate sodium, ondansetron (ZOFRAN) IV, polyethylene glycol  Assessment/ Plan:  44 y.o. male insulin-dependent diabetes mellitus, gastroparesis, peripheral neuropathy, history of DKA, history of esophageal candidiasis, chronic diarrhea, C. difficile colitis, history of QuantiFERON positive in 2017 who was admitted with DKA, acute kidney injury, hypotension.  1.  Acute kidney injury.  BUN and creatinine remain high.  Acceptable urine output noted for now.  Baseline creatinine 1.1 with an EGFR greater than 60.  No immediate need for dialysis though this may need to be considered if renal function does not improve.  2.  Metabolic acidosis.  Maintain the patient on sodium bicarb and repletion.  3.  Disposition.  Patient to be discharged to psychiatry.   LOS: 5 Gregory Moody 6/19/20211:29 PM

## 2020-03-29 NOTE — Progress Notes (Signed)
Night shift RN reports patient unable to transfer to behavioral health overnight related to indwelling foley. Dr. Reesa Chew has been made aware this morning.

## 2020-03-30 LAB — BASIC METABOLIC PANEL
Anion gap: 10 (ref 5–15)
BUN: 98 mg/dL — ABNORMAL HIGH (ref 6–20)
CO2: 16 mmol/L — ABNORMAL LOW (ref 22–32)
Calcium: 7.8 mg/dL — ABNORMAL LOW (ref 8.9–10.3)
Chloride: 106 mmol/L (ref 98–111)
Creatinine, Ser: 7.77 mg/dL — ABNORMAL HIGH (ref 0.61–1.24)
GFR calc Af Amer: 9 mL/min — ABNORMAL LOW (ref >60)
GFR calc non Af Amer: 8 mL/min — ABNORMAL LOW (ref >60)
Glucose, Bld: 110 mg/dL — ABNORMAL HIGH (ref 70–99)
Potassium: 4.8 mmol/L (ref 3.5–5.1)
Sodium: 132 mmol/L — ABNORMAL LOW (ref 135–145)

## 2020-03-30 LAB — GLUCOSE, CAPILLARY
Glucose-Capillary: 57 mg/dL — ABNORMAL LOW (ref 70–99)
Glucose-Capillary: 58 mg/dL — ABNORMAL LOW (ref 70–99)
Glucose-Capillary: 110 mg/dL — ABNORMAL HIGH (ref 70–99)
Glucose-Capillary: 92 mg/dL (ref 70–99)
Glucose-Capillary: 165 mg/dL — ABNORMAL HIGH (ref 70–99)
Glucose-Capillary: 180 mg/dL — ABNORMAL HIGH (ref 70–99)

## 2020-03-30 NOTE — Progress Notes (Signed)
[facility]  ROUNDING NOTE   Subjective:  Patient continues to have depressed affect. Creatinine about the same today at 7.7 with an EGFR of 8. Urine output was 950 cc yesterday.   Objective:  Vital signs in last 24 hours:  Temp:  [97.6 F (36.4 C)-98.6 F (37 C)] 98.6 F (37 C) (06/20 0758) Pulse Rate:  [68-75] 68 (06/20 0758) Resp:  [14-17] 14 (06/20 0758) BP: (90-129)/(62-96) 113/83 (06/20 0758) SpO2:  [94 %-100 %] 98 % (06/20 0758)  Weight change:  Filed Weights   03/25/20 0625 03/26/20 0500 03/29/20 0500  Weight: 56.9 kg 60.1 kg 60.9 kg    Intake/Output: I/O last 3 completed shifts: In: 1080 [P.O.:1080] Out: 1700 [Urine:1700]   Intake/Output this shift:  No intake/output data recorded.  Physical Exam: General: No acute distress  Head: Normocephalic, atraumatic. Moist oral mucosal membranes  Eyes: Anicteric  Neck: Supple, trachea midline  Lungs:  Clear to auscultation, normal effort  Heart: S1S2 no rubs  Abdomen:  Soft, nontender, bowel sounds present  Extremities: No peripheral edema.  Neurologic: Awake, alert, following commands  Skin: No rashes noted       Basic Metabolic Panel: Recent Labs  Lab 03/25/20 0516 03/25/20 1223 03/26/20 0530 03/26/20 0530 03/27/20 0616 03/27/20 0616 03/28/20 0451 03/29/20 0614 03/30/20 0355  NA 140   < > 137  --  136  --  132* 132* 132*  K 4.0   < > 4.7  --  4.9  --  4.8 4.8 4.8  CL 119*   < > 112*  --  109  --  107 106 106  CO2 12*   < > 18*  --  18*  --  17* 16* 16*  GLUCOSE 144*   < > 169*  --  101*  --  96 153* 110*  BUN 118*   < > 109*  --  107*  --  97* 100* 98*  CREATININE 8.97*   < > 8.46*  --  8.09*  --  7.88* 7.53* 7.77*  CALCIUM 7.8*   < > 7.8*   < > 7.9*   < > 7.6* 7.9* 7.8*  MG 1.9  --   --   --   --   --   --   --   --   PHOS 4.2  --   --   --   --   --  6.1*  --   --    < > = values in this interval not displayed.    Liver Function Tests: Recent Labs  Lab 03/24/20 1226  AST  <5*  ALT 5  ALKPHOS 54  BILITOT 0.7  PROT 5.5*  ALBUMIN 2.7*   No results for input(s): LIPASE, AMYLASE in the last 168 hours. No results for input(s): AMMONIA in the last 168 hours.  CBC: Recent Labs  Lab 03/24/20 1226 03/24/20 1226 03/24/20 1701 03/24/20 1701 03/24/20 2100 03/24/20 2100 03/25/20 0516 03/25/20 1223 03/25/20 1811 03/25/20 2328 03/26/20 0530  WBC 4.7  --  6.9  --  4.4  --  5.8  --   --   --   --   NEUTROABS 3.7  --   --   --  3.5  --  4.3  --   --   --   --   HGB 6.3*   < > 6.7*   < > 6.1*   < > 10.4* 9.6* 9.2* 9.4* 9.8*  HCT 18.6*   < >  20.6*   < > 18.7*   < > 29.4* 27.2* 26.5* 26.5* 28.4*  MCV 91.6  --  94.9  --  94.4  --  86.7  --   --   --   --   PLT 229  --  274  --  184  --  221  --   --   --   --    < > = values in this interval not displayed.    Cardiac Enzymes: No results for input(s): CKTOTAL, CKMB, CKMBINDEX, TROPONINI in the last 168 hours.  BNP: Invalid input(s): POCBNP  CBG: Recent Labs  Lab 03/28/20 1650 03/29/20 0741 03/29/20 1143 03/29/20 1628 03/29/20 2031  GLUCAP 139* 154* 178* 215* 190*    Microbiology: Results for orders placed or performed during the hospital encounter of 03/24/20  SARS Coronavirus 2 by RT PCR (hospital order, performed in [facility] hospital lab) Nasopharyngeal Nasopharyngeal Swab     Status: None   Collection Time: 03/24/20 12:26 PM   Specimen: Nasopharyngeal Swab  Result Value Ref Range Status   SARS Coronavirus 2 NEGATIVE NEGATIVE Final    Comment: (NOTE) SARS-CoV-2 target nucleic acids are NOT DETECTED.  The SARS-CoV-2 RNA is generally detectable in upper and lower respiratory specimens during the acute phase of infection. The lowest concentration of SARS-CoV-2 viral copies this assay can detect is 250 copies / mL. A negative result does not preclude SARS-CoV-2 infection and should not be used as the sole basis for treatment or other patient management decisions.  A negative result may occur  with improper specimen collection / handling, submission of specimen other than nasopharyngeal swab, presence of viral mutation(s) within the areas targeted by this assay, and inadequate number of viral copies (<250 copies / mL). A negative result must be combined with clinical observations, patient history, and epidemiological information.  Fact Sheet for Patients:   StrictlyIdeas.no  Fact Sheet for Healthcare Providers: BankingDealers.co.za  This test is not yet approved or  cleared by the [place] FDA and has been authorized for detection and/or diagnosis of SARS-CoV-2 by FDA under an Emergency Use Authorization (EUA).  This EUA will remain in effect (meaning this test can be used) for the duration of the COVID-19 declaration under Section 564(b)(1) of the Act, 21 U.S.C. section 360bbb-3(b)(1), unless the authorization is terminated or revoked sooner.  Performed at [facility], [address] [place], [place] [address]   MRSA PCR Screening     Status: None   Collection Time: 03/25/20  6:14 AM   Specimen: Nasopharyngeal  Result Value Ref Range Status   MRSA by PCR NEGATIVE NEGATIVE Final    Comment:        The GeneXpert MRSA Assay (FDA approved for NASAL specimens only), is one component of a comprehensive MRSA colonization surveillance program. It is not intended to diagnose MRSA infection nor to guide or monitor treatment for MRSA infections. Performed at [facility], [address] [place], [place] [address]     Coagulation Studies: No results for input(s): LABPROT, INR in the last 72 hours.  Urinalysis: No results for input(s): COLORURINE, LABSPEC, PHURINE, GLUCOSEU, HGBUR, BILIRUBINUR, KETONESUR, PROTEINUR, UROBILINOGEN, NITRITE, LEUKOCYTESUR in the last 72 hours.  Invalid input(s): APPERANCEUR    Imaging: No results found.   Medications:   . sodium chloride Stopped (03/25/20  2300)   . Chlorhexidine Gluconate Cloth  6 each Topical Daily  . DULoxetine  20 mg Oral Daily  . insulin aspart  0-5 Units Subcutaneous QHS  . insulin aspart  0-9 Units Subcutaneous TID WC  . insulin aspart  2 Units Subcutaneous TID WC  . insulin detemir  6 Units Subcutaneous QHS  . mirtazapine  15 mg Oral QHS  . pantoprazole  40 mg Oral BID  . sodium bicarbonate  1,300 mg Oral TID  . tamsulosin  0.4 mg Oral QPC supper   acetaminophen, dextrose, docusate sodium, ondansetron (ZOFRAN) IV, polyethylene glycol  Assessment/ Plan:  44 y.o. male insulin-dependent diabetes mellitus, gastroparesis, peripheral neuropathy, history of DKA, history of esophageal candidiasis, chronic diarrhea, C. difficile colitis, history of QuantiFERON positive in 2017 who was admitted with DKA, acute kidney injury, hypotension.  1.  Acute kidney injury.  As compared to admission BUN and creatinine have improved though are still quite high at 98 and 7.7 respectively.  eGFR 8.  Urine output was 950 cc.  Therefore no urgent need for dialysis at the moment.  2.  Metabolic acidosis.  Serum bicarbonate remains low at 16.  Maintain the patient on sodium bicarbonate 1300 mg p.o. 3 times daily.  3.  Hyponatremia.  Likely due to decreased free water excretion from renal failure.  Continue to monitor.   LOS: 6 Brodan Grewell 6/20/202110:31 AM

## 2020-03-30 NOTE — Progress Notes (Signed)
PROGRESS NOTE    Gregory Moody  JJK:[id number] DOB: 1976-02-24 DOA: 03/24/2020 PCP: [place]   Brief Narrative:  Gregory Moody-Gonzalezis a 44 y.o.malewith medical history significant ofinsulin-dependent type 2 diabetes complicated by gastroparesis, several admissions for DKA, chronic malnutrition, history of Covid July 2020, as well as hospitalization for esophageal candidiasis, recently admitted from 4/12/2021to 01/30/2020 with chronic diarrhea and recentC. difficile colitis, undergoing colonoscopy with negative biopsies, with hospitalizationbeing complicated by acute urinary retention requiring temporary Foley placement,followed by another admission from 02/06/2020 to 02/09/2020 with similar complaints and urinary retention requiring Foley catheter.  Other admission with DKA and AKI from 5/20 till 03/02/2020. Again came with complaints of generalized weakness, lack of appetite and nausea. Found to be in DKA. Not using his insulin as directed.   Subjective: Patient has no new complaints today.  He said he is peeing okay.  Assessment & Plan:   Active Problems:   DKA (diabetic ketoacidoses) ([place])  Diabetes mellitus with DKA.  DKA resolved. Patient did developed hypovolemic shock requiring pressors initially now off the pressors. -Low bicarb although improving. -Multiple similar admissions-we will try to get him charitable home health services twice which were never established as they do not pick up phone. -I consulted psych again  to see if there is any underlying cognitive issues and he is unable to take care of himself-he qualifies for inpatient behavioral health treatment for his underlying depression.  Hypoglycemia.  Patient continued to become hypoglycemic overnight requiring intervention.  We will discontinue Lantus.  Patient developed worsening renal function and most likely unable to clear at this time. -Continue  SSI.   -Medically stable to be  transferred to behavioral health once bed becomes available. -Was supposed to go to behavioral health 2 nights ago, they refused to take him as he has a chronic Foley catheter.  AKI.  Most likely secondary to uncontrolled diabetes and dehydration.  Previously his AKI responds well to Foley placement.  Patient was discharged home with Foley during prior hospitalization and advised to follow-up with urology which he never did. Some improvement in creatinine to 7.77 today. -Nephrology consult-nephrology he is stable to be transferred to behavioral health and they will follow-up there.  He will need daily BMPs. -Avoid nephrotoxins  Urinary retention.  Patient is having post void will volume above 200.  Foley catheter was removed yesterday to see if he is able to void without any retention as it can worsen his renal function. Concern of neurogenic bladder secondary to uncontrolled diabetes. Might be some BPH, less likely to cause that much of obstruction at his age. We will monitor him for another day without Foley and do frequent post void checks.  If continue to retain he will need a replacement of Foley.  At that point he will go home and will need to follow-up with psychiatry and urology as an outpatient which he might not do. High risk for readmission if we are unable to take care of his underlying severe depression which is a big cause of him being not taking care of himself.  Protein caloric malnutrition.  Patient is unable to take care of himself. -Dietitian consult.   Objective: Vitals:   03/29/20 2326 03/30/20 0356 03/30/20 0758 03/30/20 1217  BP: 111/85 90/62 113/83 117/86  Pulse: 70 71 68 70  Resp: 16 16 14 14   Temp: 98.2 F (36.8 C) 97.9 F (36.6 C) 98.6 F (37 C) 98.8 F (37.1 C)  TempSrc: Oral Oral  SpO2: 99% 94% 98% 97%  Weight:      Height:        Intake/Output Summary (Last 24 hours) at 03/30/2020 1240 Last data filed at 03/30/2020 1110 Gross per 24 hour  Intake 840  ml  Output 925 ml  Net -85 ml   Filed Weights   03/25/20 0625 03/26/20 0500 03/29/20 0500  Weight: 56.9 kg 60.1 kg 60.9 kg    Examination:  General exam: Chronically ill-appearing, emaciated gentleman,appears calm and comfortable  Respiratory system: Clear to auscultation. Respiratory effort normal. Cardiovascular system: S1 & S2 heard, RRR. No JVD, murmurs, rubs, gallops or clicks. Gastrointestinal system: Soft, nontender, nondistended, bowel sounds positive. Central nervous system: Alert and oriented. No focal neurological deficits. Extremities: No edema, no cyanosis, pulses intact and symmetrical. Psychiatry: Judgement and insight appear normal.  DVT prophylaxis: Lovenox Code Status: Full Family Communication: No family at bedside Disposition Plan:  Status is: Inpatient  Remains inpatient appropriate because:Inpatient level of care appropriate due to severity of illness   Dispo: The patient is from: Home              Anticipated d/c is to: Behavioral health              Anticipated d/c date is: 1 day              Patient currently is medically stable for transfer to behavioral health once bed becomes available.   Patient is very high risk for readmission and deterioration.  Multiple admission with similar complaints.  Does not use his insulin or any other medication at home.  Unable to establish home health services as they does not respond to phone calls.  Has no insurance.  Behavioral health refused to take him as he has a chronic Foley catheter.  We are giving him a voiding trial today and see how he responds.  If he fails that, he will need Foley again.  He will remain very high risk for readmission but we have to discharge him home then.  Consultants:   Nephrology  Psychiatry  Procedures:  Antimicrobials:   Data Reviewed: I have personally reviewed following labs and imaging studies  CBC: Recent Labs  Lab 03/24/20 1226 03/24/20 1226 03/24/20 1701  03/24/20 1701 03/24/20 2100 03/24/20 2100 03/25/20 0516 03/25/20 1223 03/25/20 1811 03/25/20 2328 03/26/20 0530  WBC 4.7  --  6.9  --  4.4  --  5.8  --   --   --   --   NEUTROABS 3.7  --   --   --  3.5  --  4.3  --   --   --   --   HGB 6.3*   < > 6.7*   < > 6.1*   < > 10.4* 9.6* 9.2* 9.4* 9.8*  HCT 18.6*   < > 20.6*   < > 18.7*   < > 29.4* 27.2* 26.5* 26.5* 28.4*  MCV 91.6  --  94.9  --  94.4  --  86.7  --   --   --   --   PLT 229  --  274  --  184  --  221  --   --   --   --    < > = values in this interval not displayed.   Basic Metabolic Panel: Recent Labs  Lab 03/25/20 0516 03/25/20 1223 03/26/20 0530 03/27/20 0616 03/28/20 0451 03/29/20 0614 03/30/20 0355  NA 140   < > 137 136  132* 132* 132*  K 4.0   < > 4.7 4.9 4.8 4.8 4.8  CL 119*   < > 112* 109 107 106 106  CO2 12*   < > 18* 18* 17* 16* 16*  GLUCOSE 144*   < > 169* 101* 96 153* 110*  BUN 118*   < > 109* 107* 97* 100* 98*  CREATININE 8.97*   < > 8.46* 8.09* 7.88* 7.53* 7.77*  CALCIUM 7.8*   < > 7.8* 7.9* 7.6* 7.9* 7.8*  MG 1.9  --   --   --   --   --   --   PHOS 4.2  --   --   --  6.1*  --   --    < > = values in this interval not displayed.   GFR: Estimated Creatinine Clearance: 10.5 mL/min (A) (by C-G formula based on SCr of 7.77 mg/dL (H)). Liver Function Tests: Recent Labs  Lab 03/24/20 1226  AST <5*  ALT 5  ALKPHOS 54  BILITOT 0.7  PROT 5.5*  ALBUMIN 2.7*   No results for input(s): LIPASE, AMYLASE in the last 168 hours. No results for input(s): AMMONIA in the last 168 hours. Coagulation Profile: Recent Labs  Lab 03/24/20 2100  INR 1.4*   Cardiac Enzymes: No results for input(s): CKTOTAL, CKMB, CKMBINDEX, TROPONINI in the last 168 hours. BNP (last 3 results) No results for input(s): PROBNP in the last 8760 hours. HbA1C: No results for input(s): HGBA1C in the last 72 hours. CBG: Recent Labs  Lab 03/29/20 2031 03/30/20 0757 03/30/20 0822 03/30/20 0851 03/30/20 1154  GLUCAP 190* 57*  58* 110* 92   Lipid Profile: No results for input(s): CHOL, HDL, LDLCALC, TRIG, CHOLHDL, LDLDIRECT in the last 72 hours. Thyroid Function Tests: No results for input(s): TSH, T4TOTAL, FREET4, T3FREE, THYROIDAB in the last 72 hours. Anemia Panel: No results for input(s): VITAMINB12, FOLATE, FERRITIN, TIBC, IRON, RETICCTPCT in the last 72 hours. Sepsis Labs: Recent Labs  Lab 03/24/20 1226 03/24/20 1539 03/24/20 1604 03/25/20 0516  PROCALCITON  --  0.29  --  0.18  LATICACIDVEN 0.8  --  1.2  --     Recent Results (from the past 240 hour(s))  SARS Coronavirus 2 by RT PCR (hospital order, performed in [facility] hospital lab) Nasopharyngeal Nasopharyngeal Swab     Status: None   Collection Time: 03/24/20 12:26 PM   Specimen: Nasopharyngeal Swab  Result Value Ref Range Status   SARS Coronavirus 2 NEGATIVE NEGATIVE Final    Comment: (NOTE) SARS-CoV-2 target nucleic acids are NOT DETECTED.  The SARS-CoV-2 RNA is generally detectable in upper and lower respiratory specimens during the acute phase of infection. The lowest concentration of SARS-CoV-2 viral copies this assay can detect is 250 copies / mL. A negative result does not preclude SARS-CoV-2 infection and should not be used as the sole basis for treatment or other patient management decisions.  A negative result may occur with improper specimen collection / handling, submission of specimen other than nasopharyngeal swab, presence of viral mutation(s) within the areas targeted by this assay, and inadequate number of viral copies (<250 copies / mL). A negative result must be combined with clinical observations, patient history, and epidemiological information.  Fact Sheet for Patients:   StrictlyIdeas.no  Fact Sheet for Healthcare Providers: BankingDealers.co.za  This test is not yet approved or  cleared by the [place] FDA and has been authorized for detection and/or  diagnosis of SARS-CoV-2 by FDA under an  Emergency Use Authorization (EUA).  This EUA will remain in effect (meaning this test can be used) for the duration of the COVID-19 declaration under Section 564(b)(1) of the Act, 21 U.S.C. section 360bbb-3(b)(1), unless the authorization is terminated or revoked sooner.  Performed at [facility], [address] [place], [place] [address]   MRSA PCR Screening     Status: None   Collection Time: 03/25/20  6:14 AM   Specimen: Nasopharyngeal  Result Value Ref Range Status   MRSA by PCR NEGATIVE NEGATIVE Final    Comment:        The GeneXpert MRSA Assay (FDA approved for NASAL specimens only), is one component of a comprehensive MRSA colonization surveillance program. It is not intended to diagnose MRSA infection nor to guide or monitor treatment for MRSA infections. Performed at [facility], [address], [place], [place] [address]      Radiology Studies: No results found.  Scheduled Meds: . Chlorhexidine Gluconate Cloth  6 each Topical Daily  . DULoxetine  20 mg Oral Daily  . insulin aspart  0-5 Units Subcutaneous QHS  . insulin aspart  0-9 Units Subcutaneous TID WC  . insulin aspart  2 Units Subcutaneous TID WC  . mirtazapine  15 mg Oral QHS  . pantoprazole  40 mg Oral BID  . sodium bicarbonate  1,300 mg Oral TID  . tamsulosin  0.4 mg Oral QPC supper   Continuous Infusions: . sodium chloride Stopped (03/25/20 2300)     LOS: 6 days   Time spent: 40 minutes.  Lorella Nimrod, MD [facility]  If 7PM-7AM, please contact night-coverage [email or web address]  03/30/2020, 12:40 PM   This record has been created using Systems analyst. Errors have been sought and corrected,but may not always be located. Such creation errors do not reflect on the standard of care.

## 2020-03-31 ENCOUNTER — Inpatient Hospital Stay
Admission: AD | Admit: 2020-03-31 | Discharge: 2020-04-02 | DRG: 885 | Disposition: A | Discharge status: Other Acute Inpatient Hospital | Payer: No Typology Code available for payment source | Source: Intra-hospital | Attending: Psychiatry | Admitting: Psychiatry

## 2020-03-31 DIAGNOSIS — E1143 Type 2 diabetes mellitus with diabetic autonomic (poly)neuropathy: Secondary | ICD-10-CM | POA: Diagnosis present

## 2020-03-31 DIAGNOSIS — Z8616 Personal history of COVID-19: Secondary | ICD-10-CM

## 2020-03-31 DIAGNOSIS — F17290 Nicotine dependence, other tobacco product, uncomplicated: Secondary | ICD-10-CM | POA: Diagnosis present

## 2020-03-31 DIAGNOSIS — F332 Major depressive disorder, recurrent severe without psychotic features: Principal | ICD-10-CM | POA: Diagnosis present

## 2020-03-31 DIAGNOSIS — N179 Acute kidney failure, unspecified: Secondary | ICD-10-CM | POA: Diagnosis not present

## 2020-03-31 DIAGNOSIS — Z794 Long term (current) use of insulin: Secondary | ICD-10-CM

## 2020-03-31 DIAGNOSIS — E871 Hypo-osmolality and hyponatremia: Secondary | ICD-10-CM | POA: Diagnosis not present

## 2020-03-31 DIAGNOSIS — R339 Retention of urine, unspecified: Secondary | ICD-10-CM | POA: Diagnosis not present

## 2020-03-31 DIAGNOSIS — Z833 Family history of diabetes mellitus: Secondary | ICD-10-CM

## 2020-03-31 DIAGNOSIS — E872 Acidosis: Secondary | ICD-10-CM | POA: Diagnosis not present

## 2020-03-31 DIAGNOSIS — R45851 Suicidal ideations: Secondary | ICD-10-CM | POA: Diagnosis present

## 2020-03-31 DIAGNOSIS — K3184 Gastroparesis: Secondary | ICD-10-CM | POA: Diagnosis present

## 2020-03-31 DIAGNOSIS — E46 Unspecified protein-calorie malnutrition: Secondary | ICD-10-CM | POA: Diagnosis present

## 2020-03-31 DIAGNOSIS — E1165 Type 2 diabetes mellitus with hyperglycemia: Secondary | ICD-10-CM | POA: Diagnosis not present

## 2020-03-31 DIAGNOSIS — Z681 Body mass index (BMI) 19 or less, adult: Secondary | ICD-10-CM | POA: Diagnosis not present

## 2020-03-31 LAB — RENAL FUNCTION PANEL
Albumin: 2.1 g/dL — ABNORMAL LOW (ref 3.5–5.0)
Anion gap: 8 (ref 5–15)
BUN: 101 mg/dL — ABNORMAL HIGH (ref 6–20)
CO2: 18 mmol/L — ABNORMAL LOW (ref 22–32)
Calcium: 7.5 mg/dL — ABNORMAL LOW (ref 8.9–10.3)
Chloride: 106 mmol/L (ref 98–111)
Creatinine, Ser: 7.28 mg/dL — ABNORMAL HIGH (ref 0.61–1.24)
GFR calc Af Amer: 10 mL/min — ABNORMAL LOW (ref >60)
GFR calc non Af Amer: 8 mL/min — ABNORMAL LOW (ref >60)
Glucose, Bld: 165 mg/dL — ABNORMAL HIGH (ref 70–99)
Phosphorus: 6.6 mg/dL — ABNORMAL HIGH (ref 2.5–4.6)
Potassium: 5.2 mmol/L — ABNORMAL HIGH (ref 3.5–5.1)
Sodium: 132 mmol/L — ABNORMAL LOW (ref 135–145)

## 2020-03-31 LAB — GLUCOSE, CAPILLARY
Glucose-Capillary: 113 mg/dL — ABNORMAL HIGH (ref 70–99)
Glucose-Capillary: 118 mg/dL — ABNORMAL HIGH (ref 70–99)
Glucose-Capillary: 127 mg/dL — ABNORMAL HIGH (ref 70–99)
Glucose-Capillary: 153 mg/dL — ABNORMAL HIGH (ref 70–99)
Glucose-Capillary: 170 mg/dL — ABNORMAL HIGH (ref 70–99)

## 2020-03-31 MED ORDER — SODIUM ZIRCONIUM CYCLOSILICATE 10 G PO PACK: 10.0000 g | PACK | Freq: Every day | ORAL | 0 refills | Status: DC

## 2020-03-31 MED ORDER — MAGNESIUM HYDROXIDE 400 MG/5ML PO SUSP
30.0000 mL | Freq: Every day | ORAL | Status: DC | PRN
  Administered 2020-04-02: 30 mL via ORAL
  Filled 2020-03-31: qty 30

## 2020-03-31 MED ORDER — HYDROXYZINE HCL 10 MG PO TABS: 10.0000 mg | ORAL_TABLET | Freq: Three times a day (TID) | ORAL | Status: DC | PRN

## 2020-03-31 MED ORDER — INSULIN DETEMIR 100 UNIT/ML ~~LOC~~ SOLN: 3.0000 [IU] | Freq: Every day | SUBCUTANEOUS | 11 refills | Status: DC

## 2020-03-31 MED ORDER — DULOXETINE HCL 20 MG PO CPEP: 20.0000 mg | ORAL_CAPSULE | Freq: Every day | ORAL | 3 refills | Status: DC

## 2020-03-31 MED ORDER — SODIUM BICARBONATE 650 MG PO TABS: 1300.0000 mg | ORAL_TABLET | Freq: Three times a day (TID) | ORAL | 2 refills | Status: DC

## 2020-03-31 MED ORDER — SODIUM ZIRCONIUM CYCLOSILICATE 10 G PO PACK
10.0000 g | PACK | Freq: Every day | ORAL | Status: DC
  Administered 2020-03-31: 10 g via ORAL
  Filled 2020-03-31 (×2): qty 1

## 2020-03-31 MED ORDER — MIRTAZAPINE 15 MG PO TBDP: 15.0000 mg | ORAL_TABLET | Freq: Every day | ORAL | 2 refills | Status: DC

## 2020-03-31 MED ORDER — ALUM & MAG HYDROXIDE-SIMETH 200-200-20 MG/5ML PO SUSP: 30.0000 mL | ORAL | Status: DC | PRN

## 2020-03-31 NOTE — Progress Notes (Signed)
TTS just informed RN pt would be able to go to [facility] at 2030. Will report off to night nurse that pt needs to be DC and taking down to [facility]

## 2020-03-31 NOTE — Progress Notes (Signed)
Notified TTS to inquire about transfer to Behavior health department. Informed they will touch base with psychiatry to initiate the process.

## 2020-03-31 NOTE — Progress Notes (Signed)
[facility]  ROUNDING NOTE   Subjective:  Patient eating breakfast this a.m. Much more awake and alert today. Renal function continues to slowly improve. Creatinine down to 7.28.   Objective:  Vital signs in last 24 hours:  Temp:  [98 F (36.7 C)-98.8 F (37.1 C)] 98.1 F (36.7 C) (06/21 0805) Pulse Rate:  [70-79] 75 (06/21 0805) Resp:  [14-20] 20 (06/21 0805) BP: (96-132)/(59-91) 106/72 (06/21 0805) SpO2:  [94 %-100 %] 94 % (06/21 0805)  Weight change:  Filed Weights   03/25/20 0625 03/26/20 0500 03/29/20 0500  Weight: 56.9 kg 60.1 kg 60.9 kg    Intake/Output: I/O last 3 completed shifts: In: -  Out: 925 [Urine:925]   Intake/Output this shift:  Total I/O In: 480 [P.O.:480] Out: -   Physical Exam: General: No acute distress  Head: Normocephalic, atraumatic. Moist oral mucosal membranes  Eyes: Anicteric  Neck: Supple, trachea midline  Lungs:  Clear to auscultation, normal effort  Heart: S1S2 no rubs  Abdomen:  Soft, nontender, bowel sounds present  Extremities: No peripheral edema.  Neurologic: Awake, alert, following commands  Skin: No rashes noted       Basic Metabolic Panel: Recent Labs  Lab 03/25/20 0516 03/25/20 1223 03/27/20 0616 03/27/20 0616 03/28/20 0451 03/28/20 0451 03/29/20 0614 03/30/20 0355 03/31/20 0404  NA 140   < > 136  --  132*  --  132* 132* 132*  K 4.0   < > 4.9  --  4.8  --  4.8 4.8 5.2*  CL 119*   < > 109  --  107  --  106 106 106  CO2 12*   < > 18*  --  17*  --  16* 16* 18*  GLUCOSE 144*   < > 101*  --  96  --  153* 110* 165*  BUN 118*   < > 107*  --  97*  --  100* 98* 101*  CREATININE 8.97*   < > 8.09*  --  7.88*  --  7.53* 7.77* 7.28*  CALCIUM 7.8*   < > 7.9*   < > 7.6*   < > 7.9* 7.8* 7.5*  MG 1.9  --   --   --   --   --   --   --   --   PHOS 4.2  --   --   --  6.1*  --   --   --  6.6*   < > = values in this interval not displayed.    Liver Function Tests: Recent Labs  Lab 03/24/20 1226  03/31/20 0404  AST <5*  --   ALT 5  --   ALKPHOS 54  --   BILITOT 0.7  --   PROT 5.5*  --   ALBUMIN 2.7* 2.1*   No results for input(s): LIPASE, AMYLASE in the last 168 hours. No results for input(s): AMMONIA in the last 168 hours.  CBC: Recent Labs  Lab 03/24/20 1226 03/24/20 1226 03/24/20 1701 03/24/20 1701 03/24/20 2100 03/24/20 2100 03/25/20 0516 03/25/20 1223 03/25/20 1811 03/25/20 2328 03/26/20 0530  WBC 4.7  --  6.9  --  4.4  --  5.8  --   --   --   --   NEUTROABS 3.7  --   --   --  3.5  --  4.3  --   --   --   --   HGB 6.3*   < > 6.7*   < >  6.1*   < > 10.4* 9.6* 9.2* 9.4* 9.8*  HCT 18.6*   < > 20.6*   < > 18.7*   < > 29.4* 27.2* 26.5* 26.5* 28.4*  MCV 91.6  --  94.9  --  94.4  --  86.7  --   --   --   --   PLT 229  --  274  --  184  --  221  --   --   --   --    < > = values in this interval not displayed.    Cardiac Enzymes: No results for input(s): CKTOTAL, CKMB, CKMBINDEX, TROPONINI in the last 168 hours.  BNP: Invalid input(s): POCBNP  CBG: Recent Labs  Lab 03/30/20 0851 03/30/20 1154 03/30/20 1643 03/30/20 2057 03/31/20 0802  GLUCAP 110* 92 165* 180* 153*    Microbiology: Results for orders placed or performed during the hospital encounter of 03/24/20  SARS Coronavirus 2 by RT PCR (hospital order, performed in [facility] hospital lab) Nasopharyngeal Nasopharyngeal Swab     Status: None   Collection Time: 03/24/20 12:26 PM   Specimen: Nasopharyngeal Swab  Result Value Ref Range Status   SARS Coronavirus 2 NEGATIVE NEGATIVE Final    Comment: (NOTE) SARS-CoV-2 target nucleic acids are NOT DETECTED.  The SARS-CoV-2 RNA is generally detectable in upper and lower respiratory specimens during the acute phase of infection. The lowest concentration of SARS-CoV-2 viral copies this assay can detect is 250 copies / mL. A negative result does not preclude SARS-CoV-2 infection and should not be used as the sole basis for treatment or other patient  management decisions.  A negative result may occur with improper specimen collection / handling, submission of specimen other than nasopharyngeal swab, presence of viral mutation(s) within the areas targeted by this assay, and inadequate number of viral copies (<250 copies / mL). A negative result must be combined with clinical observations, patient history, and epidemiological information.  Fact Sheet for Patients:   StrictlyIdeas.no  Fact Sheet for Healthcare Providers: BankingDealers.co.za  This test is not yet approved or  cleared by the [place] FDA and has been authorized for detection and/or diagnosis of SARS-CoV-2 by FDA under an Emergency Use Authorization (EUA).  This EUA will remain in effect (meaning this test can be used) for the duration of the COVID-19 declaration under Section 564(b)(1) of the Act, 21 U.S.C. section 360bbb-3(b)(1), unless the authorization is terminated or revoked sooner.  Performed at [facility], [address] [place], [place] [address]   MRSA PCR Screening     Status: None   Collection Time: 03/25/20  6:14 AM   Specimen: Nasopharyngeal  Result Value Ref Range Status   MRSA by PCR NEGATIVE NEGATIVE Final    Comment:        The GeneXpert MRSA Assay (FDA approved for NASAL specimens only), is one component of a comprehensive MRSA colonization surveillance program. It is not intended to diagnose MRSA infection nor to guide or monitor treatment for MRSA infections. Performed at [facility], [address] [place], [place] [address]     Coagulation Studies: No results for input(s): LABPROT, INR in the last 72 hours.  Urinalysis: No results for input(s): COLORURINE, LABSPEC, PHURINE, GLUCOSEU, HGBUR, BILIRUBINUR, KETONESUR, PROTEINUR, UROBILINOGEN, NITRITE, LEUKOCYTESUR in the last 72 hours.  Invalid input(s): APPERANCEUR    Imaging: No results  found.   Medications:   . sodium chloride Stopped (03/25/20 2300)   . Chlorhexidine Gluconate Cloth  6 each Topical Daily  . DULoxetine  20 mg Oral Daily  . insulin aspart  0-5 Units Subcutaneous QHS  . insulin aspart  0-9 Units Subcutaneous TID WC  . insulin aspart  2 Units Subcutaneous TID WC  . mirtazapine  15 mg Oral QHS  . pantoprazole  40 mg Oral BID  . sodium bicarbonate  1,300 mg Oral TID  . sodium zirconium cyclosilicate  10 g Oral Daily  . tamsulosin  0.4 mg Oral QPC supper   acetaminophen, dextrose, docusate sodium, ondansetron (ZOFRAN) IV, polyethylene glycol  Assessment/ Plan:  44 y.o. male insulin-dependent diabetes mellitus, gastroparesis, peripheral neuropathy, history of DKA, history of esophageal candidiasis, chronic diarrhea, C. difficile colitis, history of QuantiFERON positive in 2017 who was admitted with DKA, acute kidney injury, hypotension.  1.  Acute kidney injury.  Renal function improving very slowly.  BUN currently 101 with a creatinine down to 7.28.  Does appear to be producing urine.  Therefore no immediate need for dialysis.  2.  Metabolic acidosis.  Serum bicarbonate up to 18.  Maintain the patient on sodium bicarbonate 1300 mg p.o. 3 times daily.  3.  Hyponatremia.  Serum sodium stable at 132.  Continue to monitor.   LOS: 7 Therin Vetsch 6/21/202111:49 AM

## 2020-03-31 NOTE — BH Assessment (Signed)
Patient can come down at 8:30pm  Patient is to be admitted to [facility] by Dr. Weber Cooks.  Attending Physician will be. Dr. Weber Cooks.   Patient has been assigned to room 310, by [place] Nurse Demetria, RN.  Intake Paper Work has been signed and placed on patient chart.   staff is aware of the admission:  1. Melissa Patient's Nurse (Agreed to inform secretary & MD on floor) 2. Lauren Patient Access.

## 2020-03-31 NOTE — TOC Progression Note (Signed)
Transition of Care [facility]) - Progression Note    Patient Details  Name: Vishwa Dais MRN: [id number] Date of Birth: [birth year]  [facility]) CM/SW Contact  Su Hilt, RN Phone Number: 03/31/2020, 9:09 AM  Clinical Narrative:    Tommi Rumps with [facility] stated that even tho it is their Chrity week they will not be opening the patient due to he was set up with a Home health multiple times in the past and did not answer or allow the [facility] agency to come out to see him, The agency that he was assigned to last is supposed to take him and that was [facility], they did not open him last admission due to him not answering the phone and no allowing [place] to see him   Expected Discharge Plan: Home/Self Care Barriers to Discharge: Continued Medical Work up  Expected Discharge Plan and Services Expected Discharge Plan: Home/Self Care   Discharge Planning Services: CM Consult     Expected Discharge Date: 03/28/20                                     Social Determinants of Health (SDOH) Interventions    Readmission Risk Interventions Readmission Risk Prevention Plan 03/31/2020 02/29/2020 04/19/2019  Transportation Screening Complete Complete Complete  PCP or Specialist Appt within 3-5 Days - - -  Social Work Consult for [place] - - -  Medication Review Press photographer) Complete Complete Complete  PCP or Specialist appointment within 3-5 days of discharge - Complete Complete  HRI or Home Care Consult Complete Complete Complete  SW Recovery Care/Counseling Consult Complete - Complete  Palliative Care Screening Not Applicable - Not [place] Not Applicable Complete Not Applicable  Some recent data might be hidden

## 2020-03-31 NOTE — TOC Progression Note (Signed)
Transition of Care [facility]) - Progression Note    Patient Details  Name: Gregory Moody MRN: [id number] Date of Birth: [birth year]  Transition of Care [facility]) CM/SW Contact  Su Hilt, RN Phone Number: 03/31/2020, 8:38 AM  Clinical Narrative:    Set patient up for [place] for wound care thru [facility], Left information for Cindy at [facility] as well as RNCM contact information The patient can get his medications from [facility], I notified the physician, He has already been provided with [facility] information for food and transportation    Expected Discharge Plan: Home/Self Care Barriers to Discharge: Continued Medical Work up  Expected Discharge Plan and Services Expected Discharge Plan: Home/Self Care   Discharge Planning Services: CM Consult     Expected Discharge Date: 03/28/20                                     Social Determinants of Health (SDOH) Interventions    Readmission Risk Interventions Readmission Risk Prevention Plan 02/29/2020 04/19/2019 03/07/2019  Transportation Screening Complete Complete Complete  PCP or Specialist Appt within 3-5 Days - - Complete  Social Work Consult for [place] Planning/Counseling - - (No Data)  Palliative Care Screening - - Not Applicable  Medication Review Press photographer) Complete Complete Complete  PCP or Specialist appointment within 3-5 days of discharge Complete Complete -  HRI or Home Care Consult Complete Complete -  SW Recovery Care/Counseling Consult - Complete -  Palliative Care Screening - Not Applicable -  [place] Complete Not Applicable -  Some recent data might be hidden

## 2020-03-31 NOTE — Discharge Summary (Signed)
Physician Discharge Summary  Lincon Sahlin OFB:[id number] DOB: 08-Aug-1976 DOA: 03/24/2020  PCP: [place] date: 03/24/2020 Discharge date: 03/31/2020  Admitted From: Home Disposition: Behavioral health  Recommendations for Outpatient Follow-up:  1. Follow up with PCP in 1-2 weeks 2. Please obtain BMP/CBC in one week 3. Please follow up on the following pending results: None  Home Health: No Equipment/Devices: None  Discharge Condition: Stable CODE STATUS: Full Diet recommendation: Heart Healthy / Carb Modified   Brief/Interim Summary: Addison Santiago-Gonzalezis a 44 y.o.malewith medical history significant ofinsulin-dependent type 2 diabetes complicated by gastroparesis, several admissions for DKA, chronic malnutrition, history of Covid July 2020, as well as hospitalization for esophageal candidiasis, recently admitted from 4/12/2021to 01/30/2020 with chronic diarrhea and recentC. difficile colitis, undergoing colonoscopy with negative biopsies, with hospitalizationbeing complicated by acute urinary retention requiring temporary Foley placement,followed by another admission from 02/06/2020 to 02/09/2020 with similar complaints and urinary retention requiring Foley catheter.  Other admission with DKA and AKI from 5/20 till 03/02/2020. Again came with complaints of generalized weakness, lack of appetite and nausea. Found to be in DKA. Not using his insulin as directed.  He was also hypotensive on admission requiring pressors.  His DKA was initially treated with insulin infusion, later transitioned to basal and SSI.  Patient did develop some hypoglycemia during hospitalization most likely secondary to his renal disease as he he is unable to clear insulin.  His Lantus was decreased to 3 units on discharge along with 2 units of NovoLog with meals.  He needs a very close follow-up with PCP patient.  We consulted psych again to see any underlying psych condition  which is causing him not to take care of himself and requiring multiple admissions.  We even tried previously twice to get him a charitable home health services but no one picked up phone from his home.  There was a note that he was being kicked out of the house from his parent as he was drinking.  Per patient he stopped drinking now.  Psych evaluated him and he qualifies for inpatient management for his underlying depression.  Patient was stable to discharge to behavioral health once bed is available.  He will be discharged on Lantus 6 units at bedtime, NovoLog 2 units with meals if he eats more than 50% along with sensitive SSI if needed.  Patient did develop multiple episodes of hypoglycemia most likely secondary to worsening renal function and retention of insulin.  He will required frequent CBG monitoring and adjustment of his insulin if needed.  Diabetic coordinator can be consulted from behavioral health facility to facilitate changes if needed for his insulin requirement.  Patient developed AKI.Most likely secondary to uncontrolled diabetes and dehydration.  Previously his AKI responds well to Foley placement.  Patient was discharged home with Foley during prior hospitalization and advised to follow-up with urology which he never did.  Current hospitalization his renal function shows a very slow response.  Continue to improve but very slowly.  His creatinine was 7.88 on the day of discharge.  Nephrology was following and they are agreeable to continue following over behavioral health.  He will need a repeat daily BMP during his stay there.  Behavioral health initially refused to take him as he was on chronic Foley secondary to BPH/neurogenic bladder.  We removed Foley catheter on 03/29/2020.  Patient able to void with some residual.  Renal functions continue to improve.  We contacted behavioral health again for transfer.  He will  be transferred to behavioral health once they response.  Patient has  persistent nonanion gap metabolic acidosis secondary to renal disease.  He was initially managed with bicarb infusion.  Later transitioned to bicarb tablets.  Dose increased on 03/29/2020.  Patient has moderate protein caloric malnutrition.  Dietitian was consulted and his diet was adjusted accordingly.  He will need supplements in order to complete his dietary requirements.  Patient also found to have stage II pressure ulcers.  Wound care was consulted and he will need continuation of wound care management for his ulcerations.  Pressure Injury 12/28/19 Elbow Left Stage 2 -  Partial thickness loss of dermis presenting as a shallow open injury with a red, pink wound bed without slough. (Active)  12/28/19 1422  Location: Elbow  Location Orientation: Left  Staging: Stage 2 -  Partial thickness loss of dermis presenting as a shallow open injury with a red, pink wound bed without slough.  Wound Description (Comments):   Present on Admission: Yes (Present on transfer to 1A)     Pressure Injury 12/28/19 Elbow Right Stage 3 -  Full thickness tissue loss. Subcutaneous fat may be visible but bone, tendon or muscle are NOT exposed. (Active)  12/28/19 1422  Location: Elbow  Location Orientation: Right  Staging: Stage 3 -  Full thickness tissue loss. Subcutaneous fat may be visible but bone, tendon or muscle are NOT exposed.  Wound Description (Comments):   Present on Admission: Yes (Present on transfer to 1A)     Pressure Injury 02/07/20 (Active)  02/07/20 1411  Location:   Location Orientation:   Staging:   Wound Description (Comments):   Present on Admission: Yes     Pressure Injury 03/25/20 Hip Bilateral Stage 2 -  Partial thickness loss of dermis presenting as a shallow open injury with a red, pink wound bed without slough. (Active)  03/25/20 1900  Location: Hip  Location Orientation: Bilateral  Staging: Stage 2 -  Partial thickness loss of dermis presenting as a shallow open injury with a  red, pink wound bed without slough.  Wound Description (Comments):   Present on Admission: Yes     Pressure Injury 03/25/20 Buttocks Bilateral Stage 2 -  Partial thickness loss of dermis presenting as a shallow open injury with a red, pink wound bed without slough. (Active)  03/25/20 1900  Location: Buttocks  Location Orientation: Bilateral  Staging: Stage 2 -  Partial thickness loss of dermis presenting as a shallow open injury with a red, pink wound bed without slough.  Wound Description (Comments):   Present on Admission: Yes   Discharge Diagnoses:  Active Problems:   DKA (diabetic ketoacidoses) [facility])  Discharge Instructions  Discharge Instructions    Diet - low sodium heart healthy   Complete by: As directed    Discharge instructions   Complete by: As directed    It was pleasure taking care of you. Very important that you use your insulin as directed, we decreased the dose as your blood sugar was running little low due to your worsening kidney function. You need a close follow-up with your kidney doctor and a primary care physician.   Discharge wound care:   Complete by: As directed    Continue wound care according to wound care nurse recommendations.   Discharge wound care:   Complete by: As directed    Home health wound care nurse   Increase activity slowly   Complete by: As directed    Increase activity slowly  Complete by: As directed      Allergies as of 03/31/2020   No Known Allergies     Medication List    STOP taking these medications   FLUoxetine 10 MG capsule Commonly known as: PROZAC   insulin NPH-regular Human (70-30) 100 UNIT/ML injection   sucralfate 1 g tablet Commonly known as: CARAFATE     TAKE these medications   aspirin EC 81 MG tablet Take 81 mg by mouth daily.   colesevelam 625 MG tablet Commonly known as: WELCHOL Take 3 tablets (1,875 mg total) by mouth 2 (two) times daily with a meal.   docusate sodium 100 MG capsule Commonly  known as: COLACE Take 1 capsule (100 mg total) by mouth 2 (two) times daily as needed for mild constipation.   DULoxetine 20 MG capsule Commonly known as: CYMBALTA Take 1 capsule (20 mg total) by mouth daily.   Ensure Max Protein Liqd Take 330 mLs (11 oz total) by mouth 2 (two) times daily.   insulin aspart 100 UNIT/ML injection Commonly known as: novoLOG Inject 0-5 Units into the skin at bedtime.   insulin aspart 100 UNIT/ML injection Commonly known as: novoLOG Inject 0-9 Units into the skin 3 (three) times daily with meals.   insulin aspart 100 UNIT/ML injection Commonly known as: novoLOG Inject 2 Units into the skin 3 (three) times daily with meals.   insulin detemir 100 UNIT/ML injection Commonly known as: LEVEMIR Inject 0.03 mLs (3 Units total) into the skin at bedtime.   liver oil-zinc oxide 40 % ointment Commonly known as: DESITIN Apply topically 2 (two) times daily as needed for irritation.   loperamide 2 MG capsule Commonly known as: IMODIUM Take 1 capsule (2 mg total) by mouth 2 (two) times daily as needed for diarrhea or loose stools.   mirtazapine 15 MG disintegrating tablet Commonly known as: REMERON SOL-TAB Take 1 tablet (15 mg total) by mouth at bedtime.   multivitamin with minerals Tabs tablet Take 1 tablet by mouth daily.   pantoprazole 40 MG tablet Commonly known as: PROTONIX Take 1 tablet (40 mg total) by mouth 2 (two) times daily.   polyethylene glycol 17 g packet Commonly known as: MIRALAX / GLYCOLAX Take 17 g by mouth daily as needed for moderate constipation.   sodium bicarbonate 650 MG tablet Take 2 tablets (1,300 mg total) by mouth 3 (three) times daily.   sodium zirconium cyclosilicate 10 g Pack packet Commonly known as: LOKELMA Take 10 g by mouth daily. Start taking on: April 01, 2020   tamsulosin 0.4 MG Caps capsule Commonly known as: FLOMAX Take 1 capsule (0.4 mg total) by mouth daily after breakfast.            Discharge  Care Instructions  (From admission, onward)         Start     Ordered   03/31/20 0000  Discharge wound care:       Comments: Home health wound care nurse   03/31/20 1523   03/28/20 0000  Discharge wound care:       Comments: Continue wound care according to wound care nurse recommendations.   03/28/20 [place] Schedule an appointment as soon as possible for a visit.   Contact information: [place] [address] [phone]              No Known Allergies  Consultations:  Nephrology  Psychiatry  Procedures/Studies: US RENAL  Result Date: 03/25/2020 CLINICAL DATA:  Acute renal failure. EXAM: RENAL / URINARY TRACT ULTRASOUND COMPLETE COMPARISON:  None. FINDINGS: Right Kidney: Renal measurements: 12.5 x 5.2 x 5.7 cm = volume: 196 mL. Markedly increased parenchymal echogenicity demonstrated. No mass or hydronephrosis visualized. Mild right perinephric fluid is noted as well as mild ascites. Left Kidney: Renal measurements: 11.7 x 5.5 x 5.4 cm = volume: 182 mL. Markedly increased parenchymal echogenicity demonstrated. No mass or hydronephrosis visualized. Mild left perinephric fluid noted as well as mild ascites. Bladder: Foley catheter is seen in place. Diffuse bladder wall thickening is seen, even allowing for incomplete bladder distension. Other: None. IMPRESSION: Normal size kidneys with markedly increased parenchymal echogenicity, consistent with medical renal disease. No evidence of renal mass or hydronephrosis. Mild bilateral perinephric fluid, and mild ascites. Diffuse bladder wall thickening, with differential diagnosis including cystitis and chronic bladder outlet obstruction. Electronically Signed   By: Marlaine Hind M.D.   On: 03/25/2020 09:18   DG Chest Port 1 View  Result Date: 03/25/2020 CLINICAL DATA:  Initial evaluation for interval changes. EXAM: PORTABLE CHEST 1 VIEW COMPARISON:  Prior  radiograph from 03/24/2020. FINDINGS: Cardiac and mediastinal silhouettes are stable, and remain within normal limits. Lungs are hypoinflated. Interval development of mild diffuse pulmonary vascular and interstitial congestion, increased from previous. No frank pulmonary edema. No pleural effusion. Linear atelectatic changes present along the right minor fissure. Superimposed right upper lobe nodular density, stable from previous. Osseous structures and soft tissues are unchanged. IMPRESSION: 1. Interval development of mild diffuse pulmonary vascular and interstitial congestion, increased from previous. No frank pulmonary edema. 2. Stable right upper lobe nodular density. Electronically Signed   By: Jeannine Boga M.D.   On: 03/25/2020 03:53   DG Chest Portable 1 View  Result Date: 03/24/2020 CLINICAL DATA:  Weakness.  Hyperglycemia. EXAM: PORTABLE CHEST 1 VIEW COMPARISON:  Two-view chest x-ray 02/07/2020 FINDINGS: Heart size normal. Nodular density in right upper lobe is again seen. Lower lobe airspace disease is improving. Some residual right lower lobe airspace disease is noted. IMPRESSION: 1. Improving right lower lobe airspace disease. 2. Persistent nodular density in the right upper lobe. Electronically Signed   By: San Morelle M.D.   On: 03/24/2020 13:54    Subjective: Has no new complaints today.  Discharge Exam: Vitals:   03/31/20 0805 03/31/20 1200  BP: 106/72 (!) 92/58  Pulse: 75 70  Resp: 20 20  Temp: 98.1 F (36.7 C) 98.7 F (37.1 C)  SpO2: 94% 98%   Vitals:   03/30/20 2102 03/31/20 0422 03/31/20 0805 03/31/20 1200  BP: (!) 132/91 (!) 96/59 106/72 (!) 92/58  Pulse: 79 71 75 70  Resp: 16 16 20 20   Temp: 98.3 F (36.8 C) 98 F (36.7 C) 98.1 F (36.7 C) 98.7 F (37.1 C)  TempSrc: Oral Oral Oral Oral  SpO2: 95% 100% 94% 98%  Weight:      Height:        General: Pt is alert, awake, not in acute distress Cardiovascular: RRR, S1/S2 +, no rubs, no  gallops Respiratory: CTA bilaterally, no wheezing, no rhonchi Abdominal: Soft, NT, ND, bowel sounds + Extremities: no edema, no cyanosis   The results of significant diagnostics from this hospitalization (including imaging, microbiology, ancillary and laboratory) are listed below for reference.    Microbiology: Recent Results (from the past 240 hour(s))  SARS Coronavirus 2 by RT PCR (hospital order, performed in [facility] hospital lab) Nasopharyngeal  Nasopharyngeal Swab     Status: None   Collection Time: 03/24/20 12:26 PM   Specimen: Nasopharyngeal Swab  Result Value Ref Range Status   SARS Coronavirus 2 NEGATIVE NEGATIVE Final    Comment: (NOTE) SARS-CoV-2 target nucleic acids are NOT DETECTED.  The SARS-CoV-2 RNA is generally detectable in upper and lower respiratory specimens during the acute phase of infection. The lowest concentration of SARS-CoV-2 viral copies this assay can detect is 250 copies / mL. A negative result does not preclude SARS-CoV-2 infection and should not be used as the sole basis for treatment or other patient management decisions.  A negative result may occur with improper specimen collection / handling, submission of specimen other than nasopharyngeal swab, presence of viral mutation(s) within the areas targeted by this assay, and inadequate number of viral copies (<250 copies / mL). A negative result must be combined with clinical observations, patient history, and epidemiological information.  Fact Sheet for Patients:   StrictlyIdeas.no  Fact Sheet for Healthcare Providers: BankingDealers.co.za  This test is not yet approved or  cleared by the [place] FDA and has been authorized for detection and/or diagnosis of SARS-CoV-2 by FDA under an Emergency Use Authorization (EUA).  This EUA will remain in effect (meaning this test can be used) for the duration of the COVID-19 declaration under Section  564(b)(1) of the Act, 21 U.S.C. section 360bbb-3(b)(1), unless the authorization is terminated or revoked sooner.  Performed at [facility], [address] [place], [place] [address]   MRSA PCR Screening     Status: None   Collection Time: 03/25/20  6:14 AM   Specimen: Nasopharyngeal  Result Value Ref Range Status   MRSA by PCR NEGATIVE NEGATIVE Final    Comment:        The GeneXpert MRSA Assay (FDA approved for NASAL specimens only), is one component of a comprehensive MRSA colonization surveillance program. It is not intended to diagnose MRSA infection nor to guide or monitor treatment for MRSA infections. Performed at [facility], [address] [place], [place] [address]      Labs: BNP (last 3 results) Recent Labs    04/19/19 0300 04/20/19 0500 04/20/19 1108  BNP 450.9* SPECIMEN(S) LOST IN TRANSIT DUPLICATE  628.3*   Basic Metabolic Panel: Recent Labs  Lab 03/25/20 0516 03/25/20 1223 03/27/20 0616 03/28/20 0451 03/29/20 0614 03/30/20 0355 03/31/20 0404  NA 140   < > 136 132* 132* 132* 132*  K 4.0   < > 4.9 4.8 4.8 4.8 5.2*  CL 119*   < > 109 107 106 106 106  CO2 12*   < > 18* 17* 16* 16* 18*  GLUCOSE 144*   < > 101* 96 153* 110* 165*  BUN 118*   < > 107* 97* 100* 98* 101*  CREATININE 8.97*   < > 8.09* 7.88* 7.53* 7.77* 7.28*  CALCIUM 7.8*   < > 7.9* 7.6* 7.9* 7.8* 7.5*  MG 1.9  --   --   --   --   --   --   PHOS 4.2  --   --  6.1*  --   --  6.6*   < > = values in this interval not displayed.   Liver Function Tests: Recent Labs  Lab 03/31/20 0404  ALBUMIN 2.1*   No results for input(s): LIPASE, AMYLASE in the last 168 hours. No results for input(s): AMMONIA in the last 168 hours. CBC: Recent Labs  Lab 03/24/20 1701 03/24/20  1701 03/24/20 2100 03/24/20 2100 03/25/20 0516 03/25/20 1223 03/25/20 1811 03/25/20 2328 03/26/20 0530  WBC 6.9  --  4.4  --  5.8  --   --   --   --   NEUTROABS  --   --  3.5  --  4.3  --    --   --   --   HGB 6.7*   < > 6.1*   < > 10.4* 9.6* 9.2* 9.4* 9.8*  HCT 20.6*   < > 18.7*   < > 29.4* 27.2* 26.5* 26.5* 28.4*  MCV 94.9  --  94.4  --  86.7  --   --   --   --   PLT 274  --  184  --  221  --   --   --   --    < > = values in this interval not displayed.   Cardiac Enzymes: No results for input(s): CKTOTAL, CKMB, CKMBINDEX, TROPONINI in the last 168 hours. BNP: Invalid input(s): POCBNP CBG: Recent Labs  Lab 03/30/20 1154 03/30/20 1643 03/30/20 2057 03/31/20 0802 03/31/20 1215  GLUCAP 92 165* 180* 153* 127*   D-Dimer No results for input(s): DDIMER in the last 72 hours. Hgb A1c No results for input(s): HGBA1C in the last 72 hours. Lipid Profile No results for input(s): CHOL, HDL, LDLCALC, TRIG, CHOLHDL, LDLDIRECT in the last 72 hours. Thyroid function studies No results for input(s): TSH, T4TOTAL, T3FREE, THYROIDAB in the last 72 hours.  Invalid input(s): FREET3 Anemia work up No results for input(s): VITAMINB12, FOLATE, FERRITIN, TIBC, IRON, RETICCTPCT in the last 72 hours. Urinalysis    Component Value Date/Time   COLORURINE YELLOW (A) 03/24/2020 1701   APPEARANCEUR CLOUDY (A) 03/24/2020 1701   LABSPEC 1.010 03/24/2020 1701   PHURINE 5.0 03/24/2020 1701   GLUCOSEU >=500 (A) 03/24/2020 1701   HGBUR MODERATE (A) 03/24/2020 1701   BILIRUBINUR NEGATIVE 03/24/2020 1701   KETONESUR NEGATIVE 03/24/2020 1701   PROTEINUR 100 (A) 03/24/2020 1701   NITRITE NEGATIVE 03/24/2020 1701   LEUKOCYTESUR LARGE (A) 03/24/2020 1701   Sepsis Labs Invalid input(s): PROCALCITONIN,  WBC,  LACTICIDVEN Microbiology Recent Results (from the past 240 hour(s))  SARS Coronavirus 2 by RT PCR (hospital order, performed in [place] hospital lab) Nasopharyngeal Nasopharyngeal Swab     Status: None   Collection Time: 03/24/20 12:26 PM   Specimen: Nasopharyngeal Swab  Result Value Ref Range Status   SARS Coronavirus 2 NEGATIVE NEGATIVE Final    Comment: (NOTE) SARS-CoV-2 target  nucleic acids are NOT DETECTED.  The SARS-CoV-2 RNA is generally detectable in upper and lower respiratory specimens during the acute phase of infection. The lowest concentration of SARS-CoV-2 viral copies this assay can detect is 250 copies / mL. A negative result does not preclude SARS-CoV-2 infection and should not be used as the sole basis for treatment or other patient management decisions.  A negative result may occur with improper specimen collection / handling, submission of specimen other than nasopharyngeal swab, presence of viral mutation(s) within the areas targeted by this assay, and inadequate number of viral copies (<250 copies / mL). A negative result must be combined with clinical observations, patient history, and epidemiological information.  Fact Sheet for Patients:   StrictlyIdeas.no  Fact Sheet for Healthcare Providers: BankingDealers.co.za  This test is not yet approved or  cleared by the [place] FDA and has been authorized for detection and/or diagnosis of SARS-CoV-2 by FDA under an Emergency Use Authorization (EUA).  This EUA will remain in effect (meaning this test can be used) for the duration of the COVID-19 declaration under Section 564(b)(1) of the Act, 21 U.S.C. section 360bbb-3(b)(1), unless the authorization is terminated or revoked sooner.  Performed at [facility], [address] [place], [place] [address]   MRSA PCR Screening     Status: None   Collection Time: 03/25/20  6:14 AM   Specimen: Nasopharyngeal  Result Value Ref Range Status   MRSA by PCR NEGATIVE NEGATIVE Final    Comment:        The GeneXpert MRSA Assay (FDA approved for NASAL specimens only), is one component of a comprehensive MRSA colonization surveillance program. It is not intended to diagnose MRSA infection nor to guide or monitor treatment for MRSA infections. Performed at [facility], [address] [place], [place] [address]     Time coordinating discharge: Over 30 minutes  SIGNED:  Lorella Nimrod, MD  [facility] 03/31/2020, 3:29 PM  If 7PM-7AM, please contact night-coverage [email or web address]  This record has been created using Systems analyst. Errors have been sought and corrected,but may not always be located. Such creation errors do not reflect on the standard of care.

## 2020-04-01 ENCOUNTER — Encounter: Payer: Self-pay | Admitting: Internal Medicine

## 2020-04-01 ENCOUNTER — Other Ambulatory Visit: Payer: Self-pay

## 2020-04-01 DIAGNOSIS — F332 Major depressive disorder, recurrent severe without psychotic features: Principal | ICD-10-CM

## 2020-04-01 LAB — GLUCOSE, CAPILLARY
Glucose-Capillary: 127 mg/dL — ABNORMAL HIGH (ref 70–99)
Glucose-Capillary: 180 mg/dL — ABNORMAL HIGH (ref 70–99)
Glucose-Capillary: 179 mg/dL — ABNORMAL HIGH (ref 70–99)

## 2020-04-01 MED ORDER — PANTOPRAZOLE SODIUM 40 MG PO TBEC
40.0000 mg | DELAYED_RELEASE_TABLET | Freq: Every day | ORAL | Status: DC
  Administered 2020-04-01 – 2020-04-02 (×2): 40 mg via ORAL
  Filled 2020-04-01 (×2): qty 1

## 2020-04-01 MED ORDER — PNEUMOCOCCAL VAC POLYVALENT 25 MCG/0.5ML IJ INJ
0.5000 mL | INJECTION | INTRAMUSCULAR | Status: DC
  Filled 2020-04-01 (×2): qty 0.5

## 2020-04-01 MED ORDER — CITALOPRAM HYDROBROMIDE 20 MG PO TABS
20.0000 mg | ORAL_TABLET | Freq: Every day | ORAL | Status: DC
  Administered 2020-04-01 – 2020-04-02 (×2): 20 mg via ORAL
  Filled 2020-04-01 (×2): qty 1

## 2020-04-01 MED ORDER — INSULIN ASPART 100 UNIT/ML ~~LOC~~ SOLN: 0.0000 [IU] | Freq: Every day | SUBCUTANEOUS | Status: DC

## 2020-04-01 MED ORDER — INSULIN ASPART 100 UNIT/ML ~~LOC~~ SOLN
0.0000 [IU] | Freq: Three times a day (TID) | SUBCUTANEOUS | Status: DC
  Administered 2020-04-01 – 2020-04-02 (×3): 2 [IU] via SUBCUTANEOUS
  Filled 2020-04-01 (×3): qty 1

## 2020-04-01 MED ORDER — COLESEVELAM HCL 625 MG PO TABS
1875.0000 mg | ORAL_TABLET | Freq: Two times a day (BID) | ORAL | Status: DC
  Administered 2020-04-01 – 2020-04-02 (×2): 1875 mg via ORAL
  Filled 2020-04-01 (×6): qty 3

## 2020-04-01 MED ORDER — INSULIN ASPART 100 UNIT/ML ~~LOC~~ SOLN: 0.0000 [IU] | Freq: Three times a day (TID) | SUBCUTANEOUS | Status: DC

## 2020-04-01 NOTE — Progress Notes (Signed)
Patient admitted with a suicidal plan to starve himself. Patient has poorly controlled type I diabetes that he has had since he was a child. He is very thin and cachetic appearing. He reports not having any appetite and having some financial stressors at home. He says he lives with his mother and his son and is unable to work due to his health problems. He emigrated from [place] to the [place] at age 44 and was diagnosed with diabetes shortly after coming to the [place]. Denies any history of trauma or abuse. Denies substance abuse, HI or AVH. Says he is not having suicidal thoughts "right now" and contracts for safety. His CBG is 170 at 2350. He received a sandwich tray, water, peanut butter and crackers, and was asking for sugary foods upon arrival to the unit, and was told he could not have sugar because of his diabetes. He did manipulate another staff member into giving him juice. Needy--asking for total of six blankets on top of what was already on his bed. He has a foam dressing to his sacrum underneath which is a stage two pressure ulcer according to the chart. Dressing is intact. Has some reddened areas on elbows. Skin search conducted with Liliane Channel, MHT. Patient has no contraband upon arrival. Urinated upon arrival without difficulty.

## 2020-04-01 NOTE — [facility] Suicide Risk Assessment (Signed)
[facility] Admission Suicide Risk Assessment   Nursing information obtained from:  Patient Demographic factors:  Male, Low socioeconomic status, Unemployed Current Mental Status:  Suicidal ideation indicated by patient, Suicidal ideation indicated by others Loss Factors:  Decline in physical health Historical Factors:  Impulsivity Risk Reduction Factors:  Sense of responsibility to family, Living with another person, especially a relative  Total Time spent with patient: 1 hour Principal Problem: Recurrent major depression-severe ([place]) Diagnosis:  Principal Problem:   Recurrent major depression-severe ([place]) Active Problems:   Malnutrition ([place])   Diabetes mellitus with hyperglycemia ([place])  Subjective Data: Patient seen and chart reviewed.  Patient also met with treatment team today.  This is a 44 year old man with diabetes who has had multiple hospitalizations with increasing frequency for DKA or complications of his diabetes.  He was seen by psychiatry and thought to have some suicidal thoughts and needed inpatient treatment.  Patient continues to state that he stopped taking his insulin in part because he wishes to die.  He is not very consistent about this at other times saying that he does have positive things in his life that he wishes to live for.  Does not specifically report any other plan or intention to harm himself other than by neglecting his diabetes.  Does not appear to be psychotic.  So far has been cooperative but passive with treatment.  Continued Clinical Symptoms:  Alcohol Use Disorder Identification Test Final Score (AUDIT): 0 The "Alcohol Use Disorders Identification Test", Guidelines for Use in Primary Care, Second Edition.  World Pharmac[employer] [facility]). Score between 0-7:  no or low risk or alcohol related problems. Score between 8-15:  moderate risk of alcohol related problems. Score between 16-19:  high risk of alcohol related problems. Score 20 or above:  warrants  further diagnostic evaluation for alcohol dependence and treatment.   CLINICAL FACTORS:   Depression:   Hopelessness   Musculoskeletal: Strength & Muscle Tone: decreased Gait & Station: normal Patient leans: N/A  Psychiatric Specialty Exam: Physical Exam  Nursing note and vitals reviewed. Constitutional: He appears well-developed. He appears ill.    HENT:  Head: Normocephalic and atraumatic.  Eyes: Pupils are equal, round, and reactive to light. Conjunctivae are normal.  Cardiovascular: Normal heart sounds.  Respiratory: Effort normal.  GI: Soft.  Musculoskeletal:        General: Normal range of motion.     Cervical back: Normal range of motion.  Neurological: He is alert.  Skin: Skin is warm and dry.     Psychiatric: His speech is normal. His affect is blunt. He is slowed and withdrawn. Thought content is not paranoid. He expresses impulsivity. He exhibits a depressed mood. He expresses suicidal ideation. He expresses no homicidal ideation. He expresses suicidal plans.    Review of Systems  Constitutional: Negative.   HENT: Negative.   Eyes: Negative.   Respiratory: Negative.   Cardiovascular: Negative.   Gastrointestinal: Negative.   Musculoskeletal: Negative.   Skin: Negative.   Neurological: Negative.   Psychiatric/Behavioral: Positive for dysphoric mood, sleep disturbance and suicidal ideas.    Blood pressure (!) 74/63, pulse 78, temperature 98.4 F (36.9 C), temperature source Oral, resp. rate 18, height '5\' 8"'  (1.727 m), weight 57.6 kg, SpO2 98 %.Body mass index is 19.31 kg/m.  General Appearance: Disheveled  Eye Contact:  Fair  Speech:  Clear and Coherent  Volume:  Normal  Mood:  Depressed  Affect:  Congruent  Thought Process:  Goal Directed  Orientation:  Full (  Time, Place, and Person)  Thought Content:  Logical and Rumination  Suicidal Thoughts:  Yes.  with intent/plan  Homicidal Thoughts:  No  Memory:  Immediate;   Fair Recent;   Fair Remote;    Fair  Judgement:  Impaired  Insight:  Shallow  Psychomotor Activity:  Decreased  Concentration:  Concentration: Fair  Recall:  [employer] of Knowledge:  Fair  Language:  Fair  Akathisia:  No  Handed:  Right  AIMS (if indicated):     Assets:  Desire for Improvement  ADL's:  Impaired  Cognition:  Impaired,  Mild  Sleep:  Number of Hours: 4.75      COGNITIVE FEATURES THAT CONTRIBUTE TO RISK:  Polarized thinking    SUICIDE RISK:   Mild:  Suicidal ideation of limited frequency, intensity, duration, and specificity.  There are no identifiable plans, no associated intent, mild dysphoria and related symptoms, good self-control (both objective and subjective assessment), few other risk factors, and identifiable protective factors, including available and accessible social support.  PLAN OF CARE: Patient has multiple medical needs.  I have consulted diabetes coordinator and will try to follow the recommendations for getting his blood sugars under better control.  Patient had acute kidney injury and is being followed by nephrology who will also continue to see him.  Nursing is going to check into the wounds and see if we need to have wound care do anything about them.  I am changing his antidepressant medicine but we will continue engaging with him with Spanish language interpreter daily trying to see if we can get some improvement in his mood.  I certify that inpatient services furnished can reasonably be expected to improve the patient's condition.   Alethia Berthold, MD 04/01/2020, 4:18 PM

## 2020-04-01 NOTE — Plan of Care (Signed)
  Problem: Education: Goal: Knowledge of [facility] Education information/materials will improve Outcome: Not Progressing Goal: Emotional status will improve Outcome: Not Progressing Goal: Mental status will improve Outcome: Not Progressing Goal: Verbalization of understanding the information provided will improve Outcome: Not Progressing

## 2020-04-01 NOTE — Tx Team (Addendum)
Interdisciplinary Treatment and Diagnostic Plan Update  04/01/2020 Time of Session: 9am Gregory Moody MRN: [id number]  Principal Diagnosis: <principal problem not specified>  Secondary Diagnoses: Active Problems:   Recurrent major depression-severe (H[facility])   Current Medications:  Current Facility-Administered Medications  Medication Dose Route Frequency Provider Last Rate Last Admin  . alum & mag hydroxide-simeth (MAALOX/MYLANTA) 200-200-20 MG/5ML suspension 30 mL  30 mL Oral Q4H PRN Eulas Post, MD      . hydrOXYzine (ATARAX/VISTARIL) tablet 10 mg  10 mg Oral TID PRN Eulas Post, MD      . magnesium hydroxide (MILK OF MAGNESIA) suspension 30 mL  30 mL Oral Daily PRN Eulas Post, MD      . Derrill Memo ON 04/02/2020] pneumococcal 23 valent vaccine (PNEUMOVAX-23) injection 0.5 mL  0.5 mL Intramuscular Tomorrow-1000 Clapacs, Madie Reno, MD       PTA Medications: Medications Prior to Admission  Medication Sig Dispense Refill Last Dose  . aspirin EC 81 MG tablet Take 81 mg by mouth daily.     . colesevelam (WELCHOL) 625 MG tablet Take 3 tablets (1,875 mg total) by mouth 2 (two) times daily with a meal. 180 tablet 0   . docusate sodium (COLACE) 100 MG capsule Take 1 capsule (100 mg total) by mouth 2 (two) times daily as needed for mild constipation. 10 capsule 0   . DULoxetine (CYMBALTA) 20 MG capsule Take 1 capsule (20 mg total) by mouth daily. 30 capsule 3   . Ensure Max Protein (ENSURE MAX PROTEIN) LIQD Take 330 mLs (11 oz total) by mouth 2 (two) times daily. 330 mL 0   . insulin aspart (NOVOLOG) 100 UNIT/ML injection Inject 0-5 Units into the skin at bedtime. 10 mL 11   . insulin aspart (NOVOLOG) 100 UNIT/ML injection Inject 0-9 Units into the skin 3 (three) times daily with meals. 10 mL 11   . insulin aspart (NOVOLOG) 100 UNIT/ML injection Inject 2 Units into the skin 3 (three) times daily with meals. 10 mL 11   . insulin detemir (LEVEMIR) 100 UNIT/ML injection Inject 0.03  mLs (3 Units total) into the skin at bedtime. 10 mL 11   . liver oil-zinc oxide (DESITIN) 40 % ointment Apply topically 2 (two) times daily as needed for irritation. 56.7 g 0   . loperamide (IMODIUM) 2 MG capsule Take 1 capsule (2 mg total) by mouth 2 (two) times daily as needed for diarrhea or loose stools. 40 capsule 0   . mirtazapine (REMERON SOL-TAB) 15 MG disintegrating tablet Take 1 tablet (15 mg total) by mouth at bedtime. 30 tablet 2   . Multiple Vitamin (MULTIVITAMIN WITH MINERALS) TABS tablet Take 1 tablet by mouth daily. 30 tablet 0   . pantoprazole (PROTONIX) 40 MG tablet Take 1 tablet (40 mg total) by mouth 2 (two) times daily.     . polyethylene glycol (MIRALAX / GLYCOLAX) 17 g packet Take 17 g by mouth daily as needed for moderate constipation. 14 each 0   . sodium bicarbonate 650 MG tablet Take 2 tablets (1,300 mg total) by mouth 3 (three) times daily. 180 tablet 2   . sodium zirconium cyclosilicate (LOKELMA) 10 g PACK packet Take 10 g by mouth daily. 30 packet 0   . tamsulosin (FLOMAX) 0.4 MG CAPS capsule Take 1 capsule (0.4 mg total) by mouth daily after breakfast. 30 capsule 1     Patient Stressors: Financial difficulties Health problems  Patient Strengths: Ability for insight Average or above average intelligence Supportive family/friends  Treatment Modalities: Medication Management, Group therapy, Case management,  1 to 1 session with clinician, Psychoeducation, Recreational therapy.   Physician Treatment Plan for Primary Diagnosis: <principal problem not specified> Long Term Goal(s):     Short Term Goals:    Medication Management: Evaluate patient's response, side effects, and tolerance of medication regimen.  Therapeutic Interventions: 1 to 1 sessions, Unit Group sessions and Medication administration.  Evaluation of Outcomes: Not Met  Physician Treatment Plan for Secondary Diagnosis: Active Problems:   Recurrent major depression-severe ([place])  Long Term  Goal(s):     Short Term Goals:       Medication Management: Evaluate patient's response, side effects, and tolerance of medication regimen.  Therapeutic Interventions: 1 to 1 sessions, Unit Group sessions and Medication administration.  Evaluation of Outcomes: Not Met   RN Treatment Plan for Primary Diagnosis: <principal problem not specified> Long Term Goal(s): Knowledge of disease and therapeutic regimen to maintain health will improve  Short Term Goals: Ability to identify and develop effective coping behaviors will improve and Compliance with prescribed medications will improve  Medication Management: RN will administer medications as ordered by provider, will assess and evaluate patient's response and provide education to patient for prescribed medication. RN will report any adverse and/or side effects to prescribing provider.  Therapeutic Interventions: 1 on 1 counseling sessions, Psychoeducation, Medication administration, Evaluate responses to treatment, Monitor vital signs and CBGs as ordered, Perform/monitor CIWA, COWS, AIMS and Fall Risk screenings as ordered, Perform wound care treatments as ordered.  Evaluation of Outcomes: Not Met   LCSW Treatment Plan for Primary Diagnosis: <principal problem not specified> Long Term Goal(s): Safe transition to appropriate next level of care at discharge, Engage patient in therapeutic group addressing interpersonal concerns.  Short Term Goals: Engage patient in aftercare planning with referrals and resources  Therapeutic Interventions: Assess for all discharge needs, 1 to 1 time with Social worker, Explore available resources and support systems, Assess for adequacy in community support network, Educate family and significant other(s) on suicide prevention, Complete Psychosocial Assessment, Interpersonal group therapy.  Evaluation of Outcomes: Not Met   Progress in Treatment: Attending groups: No. Participating in groups: No. Taking  medication as prescribed: Yes. Toleration medication: Yes. Family/Significant other contact made: No, will contact:  when pt gives consent Patient understands diagnosis: No. Discussing patient identified problems/goals with staff: Yes. Medical problems stabilized or resolved: No. Denies suicidal/homicidal ideation: Yes. Issues/concerns per patient self-inventory: No. Other: NA  New problem(s) identified: No, Describe:  None reported  New Short Term/Long Term Goal(s):Attend outpatient treatment, take medication as prescribed, develop and implement healthy coping methods  Patient Goals:  "Getting a room while I find a different place"  Discharge Plan or Barriers: Pt reports he was living with his parents and son but states they do not want him to remain in the home. Pt request assistance with housing. Pt is spanish speaking. D/C plan TBD.  Reason for Continuation of Hospitalization: Medication stabilization  Estimated Length of Stay:1-7 days    Recreational Therapy: Patient: N/A Patient Goal: Patient will engage in groups without prompting or encouragement from LRT x3 group sessions within 5 recreation therapy group sessions.  Attendees: Patient:Gregory Moody 04/01/2020 11:39 AM  Physician: Alethia Berthold 04/01/2020 11:39 AM  Nursing: Collier Bullock 04/01/2020 11:39 AM  RN Care Manager: 04/01/2020 11:39 AM  Social Worker: Anise Salvo 04/01/2020 11:39 AM  Recreational Therapist: Roanna Epley 04/01/2020 11:39 AM  Other:  04/01/2020 11:39 AM  Other:  04/01/2020 11:39 AM  Other: 04/01/2020 11:39 AM    Scribe for Treatment Team: Yvette Rack, LCSW 04/01/2020 11:39 AM

## 2020-04-01 NOTE — H&P (Signed)
Psychiatric Admission Assessment Adult  Patient Identification: Gregory Moody MRN:  [id number] Date of Evaluation:  04/01/2020 Chief Complaint:  Recurrent major depression-severe ([place]) [F33.2] Principal Diagnosis: Recurrent major depression-severe ([place]) Diagnosis:  Principal Problem:   Recurrent major depression-severe ([place]) Active Problems:   Malnutrition ([place])   Diabetes mellitus with hyperglycemia ([place])  History of Present Illness: Patient seen and chart reviewed.  Patient interviewed with the assistance of hospital provided Spanish language interpreter.  He also met with treatment team today also with Spanish language interpreter.  44 year old man transferred to [place] from the medical service where he had been stabilized for diabetic ketoacidosis.  Patient was seen by the psychiatric consultant during his medical stay and was thought to be depressed with suicidal ideation.  Patient has had multiple nearly back-to-back hospitalizations with increasing frequency this year for his diabetes.  On interview he reports that he is getting weaker and weaker at home and having trouble taking care of his illness.  He says he has problems with his family.  Feels like his parents do not want him at the house and think he is a burden.  He feels too weak to be able to work.  Sleeps a lot during the day and then is up at night and less he makes an effort to change it around.  He admits to feeling down and negative and says that he has intentionally not taking his insulin at times because he wants to die.  He seems a little ambivalent about it also saying that he wants to live for his grandchildren but does not seem to have much that he enjoys in his life currently.  He had been prescribed antidepressants it is not clear at all whether he has continued on any of that medicine outside the hospital but does not seem to a followed up with mental health treatment.  Not drinking not using any other drugs.  He  reports very limited social support. Associated Signs/Symptoms: Depression Symptoms:  depressed mood, anhedonia, hypersomnia, psychomotor retardation, feelings of worthlessness/guilt, difficulty concentrating, hopelessness, suicidal thoughts with specific plan, suicidal attempt, loss of energy/fatigue, disturbed sleep, (Hypo) Manic Symptoms:  Impulsivity, Anxiety Symptoms:  Excessive Worry, Psychotic Symptoms:  Does not report specific psychotic symptoms PTSD Symptoms: Negative Total Time spent with patient: 1 hour  Past Psychiatric History: Patient has been seen by psychiatry also on the consult service earlier this year.  At that time inpatient treatment was recommended but the decision was changed with the patient's clinical status improved.  No previous hospitalization.  He has been prescribed antidepressants including Prozac, most recently Cymbalta and Remeron.  Not clear that he is ever taken any of them outside the hospital.  Cannot identify any suicide attempts except by stopping the insulin.  Does not appear to have any history of psychotic symptoms.  Says that he used to drink but stopped about 3 years ago  Is the patient at risk to self? Yes.    Has the patient been a risk to self in the past 6 months? Yes.    Has the patient been a risk to self within the distant past? No.  Is the patient a risk to others? No.  Has the patient been a risk to others in the past 6 months? No.  Has the patient been a risk to others within the distant past? No.   Prior Inpatient Therapy:   Prior Outpatient Therapy:    Alcohol Screening: 1. How often do you have a drink containing  alcohol?: Never 2. How many drinks containing alcohol do you have on a typical day when you are drinking?: 1 or 2 3. How often do you have six or more drinks on one occasion?: Never AUDIT-C Score: 0 4. How often during the last year have you found that you were not able to stop drinking once you had started?:  Never 5. How often during the last year have you failed to do what was normally expected from you because of drinking?: Never 6. How often during the last year have you needed a first drink in the morning to get yourself going after a heavy drinking session?: Never 7. How often during the last year have you had a feeling of guilt of remorse after drinking?: Never 8. How often during the last year have you been unable to remember what happened the night before because you had been drinking?: Never 9. Have you or someone else been injured as a result of your drinking?: No 10. Has a relative or friend or a doctor or another health worker been concerned about your drinking or suggested you cut down?: No Alcohol Use Disorder Identification Test Final Score (AUDIT): 0 Alcohol Brief Interventions/Follow-up: Brief Advice Substance Abuse History in the last 12 months:  No. Consequences of Substance Abuse: Negative Previous Psychotropic Medications: Yes  Psychological Evaluations: Yes  Past Medical History:  Past Medical History:  Diagnosis Date  . COVID-19   . Diabetes mellitus without complication ([place])   . Gastroparesis   . Tuberculosis     Past Surgical History:  Procedure Laterality Date  . COLONOSCOPY N/A 01/25/2020   Procedure: COLONOSCOPY;  Surgeon: Toledo, Benay Pike, MD;  Location: [facility] ENDOSCOPY;  Service: Gastroenterology;  Laterality: N/A;  . ESOPHAGOGASTRODUODENOSCOPY N/A 02/03/2019   Procedure: ESOPHAGOGASTRODUODENOSCOPY (EGD);  Surgeon: Toledo, Benay Pike, MD;  Location: [facility] ENDOSCOPY;  Service: Gastroenterology;  Laterality: N/A;   Family History:  Family History  Problem Relation Age of Onset  . Diabetes Mother   . Diabetes Father    Family Psychiatric  History: Denies knowing of any Tobacco Screening: Have you used any form of tobacco in the last 30 days? (Cigarettes, Smokeless Tobacco, Cigars, and/or Pipes): Yes Tobacco use, Select all that apply: 4 or less cigarettes per  day Are you interested in Tobacco Cessation Medications?: No, patient refused Counseled patient on smoking cessation including recognizing danger situations, developing coping skills and basic information about quitting provided: Yes Social History:  Social History   Substance and Sexual Activity  Alcohol Use No     Social History   Substance and Sexual Activity  Drug Use No    Additional Social History:                           Allergies:  No Known Allergies Lab Results:  Results for orders placed or performed during the hospital encounter of 03/31/20 (from the past 48 hour(s))  Glucose, capillary     Status: Abnormal   Collection Time: 03/31/20 11:44 PM  Result Value Ref Range   Glucose-Capillary 170 (H) 70 - 99 mg/dL    Comment: Glucose reference range applies only to samples taken after fasting for at least 8 hours.  Glucose, capillary     Status: Abnormal   Collection Time: 04/01/20  7:03 AM  Result Value Ref Range   Glucose-Capillary 127 (H) 70 - 99 mg/dL    Comment: Glucose reference range applies only to samples taken after fasting  for at least 8 hours.   Comment 1 Notify RN     Blood Alcohol level:  Lab Results  Component Value Date   ETH <10 03/24/2020   ETH <10 39/53/2023    Metabolic Disorder Labs:  Lab Results  Component Value Date   HGBA1C 10.8 (H) 02/07/2020   MPG 263.26 02/07/2020   MPG 383.8 12/29/2019   No results found for: PROLACTIN Lab Results  Component Value Date   TRIG 186 (H) 04/25/2019    Current Medications: Current Facility-Administered Medications  Medication Dose Route Frequency Provider Last Rate Last Admin  . alum & mag hydroxide-simeth (MAALOX/MYLANTA) 200-200-20 MG/5ML suspension 30 mL  30 mL Oral Q4H PRN Eulas Post, MD      . citalopram (CELEXA) tablet 20 mg  20 mg Oral Daily Loring Liskey, Madie Reno, MD      . colesevelam [facility]) tablet 1,875 mg  1,875 mg Oral BID WC Danialle Dement, Madie Reno, MD      . hydrOXYzine  (ATARAX/VISTARIL) tablet 10 mg  10 mg Oral TID PRN Eulas Post, MD      . insulin aspart (novoLOG) injection 0-5 Units  0-5 Units Subcutaneous QHS Omarri Eich T, MD      . insulin aspart (novoLOG) injection 0-9 Units  0-9 Units Subcutaneous TID WC Deana Krock T, MD      . magnesium hydroxide (MILK OF MAGNESIA) suspension 30 mL  30 mL Oral Daily PRN Eulas Post, MD      . pantoprazole (PROTONIX) EC tablet 40 mg  40 mg Oral Daily Alexanderia Gorby, Madie Reno, MD      . Derrill Memo ON 04/02/2020] pneumococcal 23 valent vaccine (PNEUMOVAX-23) injection 0.5 mL  0.5 mL Intramuscular Tomorrow-1000 Mykah Bellomo T, MD       PTA Medications: Medications Prior to Admission  Medication Sig Dispense Refill Last Dose  . aspirin EC 81 MG tablet Take 81 mg by mouth daily.     . colesevelam (WELCHOL) 625 MG tablet Take 3 tablets (1,875 mg total) by mouth 2 (two) times daily with a meal. 180 tablet 0   . docusate sodium (COLACE) 100 MG capsule Take 1 capsule (100 mg total) by mouth 2 (two) times daily as needed for mild constipation. 10 capsule 0   . DULoxetine (CYMBALTA) 20 MG capsule Take 1 capsule (20 mg total) by mouth daily. 30 capsule 3   . Ensure Max Protein (ENSURE MAX PROTEIN) LIQD Take 330 mLs (11 oz total) by mouth 2 (two) times daily. 330 mL 0   . insulin aspart (NOVOLOG) 100 UNIT/ML injection Inject 0-5 Units into the skin at bedtime. 10 mL 11   . insulin aspart (NOVOLOG) 100 UNIT/ML injection Inject 0-9 Units into the skin 3 (three) times daily with meals. 10 mL 11   . insulin aspart (NOVOLOG) 100 UNIT/ML injection Inject 2 Units into the skin 3 (three) times daily with meals. 10 mL 11   . insulin detemir (LEVEMIR) 100 UNIT/ML injection Inject 0.03 mLs (3 Units total) into the skin at bedtime. 10 mL 11   . liver oil-zinc oxide (DESITIN) 40 % ointment Apply topically 2 (two) times daily as needed for irritation. 56.7 g 0   . loperamide (IMODIUM) 2 MG capsule Take 1 capsule (2 mg total) by mouth 2 (two)  times daily as needed for diarrhea or loose stools. 40 capsule 0   . mirtazapine (REMERON SOL-TAB) 15 MG disintegrating tablet Take 1 tablet (15 mg total) by mouth at bedtime. 30 tablet 2   .  Multiple Vitamin (MULTIVITAMIN WITH MINERALS) TABS tablet Take 1 tablet by mouth daily. 30 tablet 0   . pantoprazole (PROTONIX) 40 MG tablet Take 1 tablet (40 mg total) by mouth 2 (two) times daily.     . polyethylene glycol (MIRALAX / GLYCOLAX) 17 g packet Take 17 g by mouth daily as needed for moderate constipation. 14 each 0   . sodium bicarbonate 650 MG tablet Take 2 tablets (1,300 mg total) by mouth 3 (three) times daily. 180 tablet 2   . sodium zirconium cyclosilicate (LOKELMA) 10 g PACK packet Take 10 g by mouth daily. 30 packet 0   . tamsulosin (FLOMAX) 0.4 MG CAPS capsule Take 1 capsule (0.4 mg total) by mouth daily after breakfast. 30 capsule 1     Musculoskeletal: Strength & Muscle Tone: decreased Gait & Station: unsteady Patient leans: N/A  Psychiatric Specialty Exam: Physical Exam  Nursing note and vitals reviewed. Constitutional: He appears well-developed.    HENT:  Head: Normocephalic and atraumatic.  Eyes: Pupils are equal, round, and reactive to light. Conjunctivae are normal.  Cardiovascular: Normal heart sounds.  Respiratory: Effort normal.  GI: Soft.  Musculoskeletal:        General: Normal range of motion.     Cervical back: Normal range of motion.  Neurological: He is alert.  Skin: Skin is warm and dry.     Psychiatric: His speech is normal. His affect is blunt. He is slowed. He expresses impulsivity. He exhibits a depressed mood. He expresses suicidal ideation. He expresses suicidal plans.    Review of Systems  Constitutional: Negative.   HENT: Negative.   Eyes: Negative.   Respiratory: Negative.   Cardiovascular: Negative.   Gastrointestinal: Negative.   Musculoskeletal: Negative.   Skin: Negative.   Neurological: Negative.   Psychiatric/Behavioral: Positive  for behavioral problems, dysphoric mood, self-injury, sleep disturbance and suicidal ideas.    Blood pressure (!) 74/63, pulse 78, temperature 98.4 F (36.9 C), temperature source Oral, resp. rate 18, height '5\' 8"'  (1.727 m), weight 57.6 kg, SpO2 98 %.Body mass index is 19.31 kg/m.  General Appearance: Disheveled  Eye Contact:  Fair  Speech:  Clear and Coherent  Volume:  Decreased  Mood:  Depressed  Affect:  Congruent  Thought Process:  Coherent  Orientation:  Full (Time, Place, and Person)  Thought Content:  Illogical  Suicidal Thoughts:  Yes.  with intent/plan  Homicidal Thoughts:  No  Memory:  Immediate;   Fair Recent;   Fair Remote;   Fair  Judgement:  Impaired  Insight:  Shallow  Psychomotor Activity:  Decreased  Concentration:  Concentration: Fair  Recall:  [employer] of Knowledge:  Fair  Language:  Fair  Akathisia:  No  Handed:  Right  AIMS (if indicated):     Assets:  Desire for Improvement Housing Resilience  ADL's:  Impaired  Cognition:  Impaired,  Mild  Sleep:  Number of Hours: 4.75    Treatment Plan Summary: Daily contact with patient to assess and evaluate symptoms and progress in treatment, Medication management and Plan Patient with multiple symptoms of depression which however also track with his stresses in his illness.  Diabetes has been getting worse and he has been limited in his ability to care for it because of lack of resources and lack of support at home.  Currently he appears to be tired although he was cooperative with the interview.  No evidence of psychosis.  Seems ambivalent about suicidal ideation.  Plan will be first  of all to try and get diabetes under control.  I have contacted the diabetes coordinator for advice.  He had several hypoglycemic episodes in the ICU and so we are going with very low dosing and monitoring of his blood sugar for now.  I am changing the antidepressant to citalopram trying to find something he will be able to afford  possibly.  Also could be available at medication management.  Discussed treatment plan with patient.  Nephrology consult done.  Wound will be evaluated by nursing.  Encourage group attendance.  Perhaps try to get collateral information or speak to family if possible.  Observation Level/Precautions:  15 minute checks  Laboratory:  HbAIC  Psychotherapy:    Medications:    Consultations:    Discharge Concerns:    Estimated LOS:  Other:     Physician Treatment Plan for Primary Diagnosis: Recurrent major depression-severe ([place]) Long Term Goal(s): Improvement in symptoms so as ready for discharge  Short Term Goals: Ability to verbalize feelings will improve, Ability to disclose and discuss suicidal ideas and Ability to demonstrate self-control will improve  Physician Treatment Plan for Secondary Diagnosis: Principal Problem:   Recurrent major depression-severe ([place]) Active Problems:   Malnutrition ([place])   Diabetes mellitus with hyperglycemia ([place])  Long Term Goal(s): Improvement in symptoms so as ready for discharge  Short Term Goals: Ability to identify and develop effective coping behaviors will improve, Ability to maintain clinical measurements within normal limits will improve and Compliance with prescribed medications will improve  I certify that inpatient services furnished can reasonably be expected to improve the patient's condition.    Alethia Berthold, MD 6/22/20214:23 PM

## 2020-04-01 NOTE — Progress Notes (Signed)
D- Patient alert and oriented. Affect/mood withdrawn and sullen. Patient denies SI, HI, AVH, and pain. Pt states that he cannot eat however staff has seen him eating.   A- Scheduled medications administered to patient, per MD orders. Support and encouragement provided.  Routine safety checks conducted every 15 minutes.   Patient informed to notify staff with problems or concerns.   R- No adverse drug reactions noted. Patient contracts for safety at this time. Patient compliant with medications and treatment plan. Patient receptive, calm, and cooperative. Patient interacts well with others on the unit.  Patient remains safe at this time.  Collier Bullock RN

## 2020-04-01 NOTE — Progress Notes (Signed)
Recreation Therapy Notes  Date: 04/01/2020  Time: 9:30 am   Location: Craft room    Behavioral response: N/A   Intervention Topic: Strengths    Discussion/Intervention: Patient did not attend group.   Clinical Observations/Feedback:  Patient did not attend group.   Dacen Frayre LRT/CTRS         Niomie Englert 04/01/2020 11:29 AM

## 2020-04-01 NOTE — [facility] Counselor (Signed)
CSW attempted to meet with the pt to complete PSA but pt was asleep and could not be awakened.

## 2020-04-01 NOTE — Plan of Care (Signed)
Pt denies depression, anxiety, SI, HI and AVH. Pt was educated on care plan and verbalizes understanding. Collier Bullock RN  Problem: Education: Goal: Knowledge of [facility] Education information/materials will improve Outcome: Not Progressing Goal: Emotional status will improve Outcome: Not Progressing Goal: Mental status will improve Outcome: Not Progressing Goal: Verbalization of understanding the information provided will improve Outcome: Not Progressing   Problem: Activity: Goal: Interest or engagement in activities will improve Outcome: Not Progressing Goal: Sleeping patterns will improve Outcome: Not Progressing   Problem: Coping: Goal: Ability to verbalize frustrations and anger appropriately will improve Outcome: Not Progressing Goal: Ability to demonstrate self-control will improve Outcome: Not Progressing   Problem: Health Behavior/Discharge Planning: Goal: Identification of resources available to assist in meeting health care needs will improve Outcome: Not Progressing Goal: Compliance with treatment plan for underlying cause of condition will improve Outcome: Not Progressing   Problem: Physical Regulation: Goal: Ability to maintain clinical measurements within normal limits will improve Outcome: Not Progressing   Problem: Safety: Goal: Periods of time without injury will increase Outcome: Not Progressing   Problem: Education: Goal: Utilization of techniques to improve thought processes will improve Outcome: Not Progressing Goal: Knowledge of the prescribed therapeutic regimen will improve Outcome: Not Progressing   Problem: Activity: Goal: Interest or engagement in leisure activities will improve Outcome: Not Progressing Goal: Imbalance in normal sleep/wake cycle will improve Outcome: Not Progressing   Problem: Coping: Goal: Coping ability will improve Outcome: Not Progressing Goal: Will verbalize feelings Outcome: Not Progressing    Problem: Health Behavior/Discharge Planning: Goal: Ability to make decisions will improve Outcome: Not Progressing Goal: Compliance with therapeutic regimen will improve Outcome: Not Progressing   Problem: Role Relationship: Goal: Will demonstrate positive changes in social behaviors and relationships Outcome: Not Progressing   Problem: Safety: Goal: Ability to disclose and discuss suicidal ideas will improve Outcome: Not Progressing Goal: Ability to identify and utilize support systems that promote safety will improve Outcome: Not Progressing   Problem: Self-Concept: Goal: Will verbalize positive feelings about self Outcome: Not Progressing Goal: Level of anxiety will decrease Outcome: Not Progressing

## 2020-04-01 NOTE — Tx Team (Signed)
Initial Treatment Plan 04/01/2020 2:53 AM Gregory Moody [id number]    PATIENT STRESSORS: Financial difficulties Health problems   PATIENT STRENGTHS: Ability for insight Average or above average intelligence Supportive family/friends   PATIENT IDENTIFIED PROBLEMS:   depression  SI                 DISCHARGE CRITERIA:  Ability to meet basic life and health needs Adequate post-discharge living arrangements Improved stabilization in mood, thinking, and/or behavior Medical problems require only outpatient monitoring Motivation to continue treatment in a less acute level of care Need for constant or close observation no longer present Reduction of life-threatening or endangering symptoms to within safe limits Safe-care adequate arrangements made Verbal commitment to aftercare and medication compliance  PRELIMINARY DISCHARGE PLAN: Outpatient therapy Return to previous living arrangement  PATIENT/FAMILY INVOLVEMENT: This treatment plan has been presented to and reviewed with the patient, Gregory Moody, and/or family member.  The patient and family have been given the opportunity to ask questions and make suggestions.  Libby Maw, RN 04/01/2020, 2:53 AM

## 2020-04-01 NOTE — Progress Notes (Signed)
Nutrition Brief Note  Patient identified on the Malnutrition Screening Tool (MST) Report  Wt Readings from Last 15 Encounters:  03/31/20 57.6 kg  03/29/20 60.9 kg  02/29/20 46.2 kg  02/06/20 59 kg  01/29/20 55.1 kg  12/28/19 50.1 kg  11/18/19 61.2 kg  10/10/19 59 kg  05/27/19 59 kg  04/24/19 56 kg  04/17/19 58.6 kg  04/13/19 59 kg  04/01/19 59 kg  03/31/19 58 kg  03/17/19 59 kg   Height: Ht Readings from Last 1 Encounters:  03/31/20 5\' 8"  (1.727 m)    Weight: Wt Readings from Last 1 Encounters:  03/31/20 57.6 kg   BMI:  Body mass index is 19.31 kg/m. Pt meets criteria for normal weight based on current BMI.  Estimated Nutritional Needs: Kcal: 25-30 kcal/kg Protein: > 1 gram protein/kg Fluid: 1 ml/kcal  Diet Order:  Diet Order            Diet regular Room service appropriate? Yes; Fluid consistency: Thin  Diet effective now                Pt is also offered choice of unit snacks mid-morning and mid-afternoon.  Pt is eating as desired.   Lab results and medications reviewed. No nutrition interventions warranted at this time. If nutrition issues arise, please consult RD.   Lajuan Lines, RD, LDN Clinical Nutrition After Hours/Weekend Pager # in [place]

## 2020-04-01 NOTE — [facility] Group Notes (Signed)

## 2020-04-02 ENCOUNTER — Other Ambulatory Visit: Payer: Self-pay

## 2020-04-02 ENCOUNTER — Inpatient Hospital Stay
Admission: EM | Admit: 2020-04-02 | Discharge: 2020-04-09 | DRG: 073 | Disposition: A | Discharge status: Home / Self Care | Payer: Self-pay | Source: Ambulatory Visit | Attending: Internal Medicine | Admitting: Internal Medicine

## 2020-04-02 ENCOUNTER — Encounter: Payer: Self-pay | Admitting: Internal Medicine

## 2020-04-02 DIAGNOSIS — N139 Obstructive and reflux uropathy, unspecified: Secondary | ICD-10-CM | POA: Diagnosis present

## 2020-04-02 DIAGNOSIS — F17290 Nicotine dependence, other tobacco product, uncomplicated: Secondary | ICD-10-CM | POA: Diagnosis present

## 2020-04-02 DIAGNOSIS — D631 Anemia in chronic kidney disease: Secondary | ICD-10-CM | POA: Diagnosis present

## 2020-04-02 DIAGNOSIS — E43 Unspecified severe protein-calorie malnutrition: Secondary | ICD-10-CM | POA: Diagnosis present

## 2020-04-02 DIAGNOSIS — R338 Other retention of urine: Secondary | ICD-10-CM | POA: Diagnosis present

## 2020-04-02 DIAGNOSIS — I129 Hypertensive chronic kidney disease with stage 1 through stage 4 chronic kidney disease, or unspecified chronic kidney disease: Secondary | ICD-10-CM | POA: Diagnosis present

## 2020-04-02 DIAGNOSIS — Z7189 Other specified counseling: Secondary | ICD-10-CM

## 2020-04-02 DIAGNOSIS — R64 Cachexia: Secondary | ICD-10-CM | POA: Diagnosis present

## 2020-04-02 DIAGNOSIS — N2581 Secondary hyperparathyroidism of renal origin: Secondary | ICD-10-CM | POA: Diagnosis present

## 2020-04-02 DIAGNOSIS — F332 Major depressive disorder, recurrent severe without psychotic features: Secondary | ICD-10-CM | POA: Diagnosis present

## 2020-04-02 DIAGNOSIS — R339 Retention of urine, unspecified: Secondary | ICD-10-CM | POA: Diagnosis present

## 2020-04-02 DIAGNOSIS — R627 Adult failure to thrive: Secondary | ICD-10-CM | POA: Diagnosis present

## 2020-04-02 DIAGNOSIS — Z9119 Patient's noncompliance with other medical treatment and regimen: Secondary | ICD-10-CM

## 2020-04-02 DIAGNOSIS — N471 Phimosis: Secondary | ICD-10-CM | POA: Diagnosis present

## 2020-04-02 DIAGNOSIS — E871 Hypo-osmolality and hyponatremia: Secondary | ICD-10-CM | POA: Diagnosis present

## 2020-04-02 DIAGNOSIS — L89222 Pressure ulcer of left hip, stage 2: Secondary | ICD-10-CM | POA: Diagnosis present

## 2020-04-02 DIAGNOSIS — E1043 Type 1 diabetes mellitus with diabetic autonomic (poly)neuropathy: Principal | ICD-10-CM | POA: Diagnosis present

## 2020-04-02 DIAGNOSIS — E1042 Type 1 diabetes mellitus with diabetic polyneuropathy: Secondary | ICD-10-CM | POA: Diagnosis present

## 2020-04-02 DIAGNOSIS — K3184 Gastroparesis: Secondary | ICD-10-CM | POA: Diagnosis present

## 2020-04-02 DIAGNOSIS — E872 Acidosis: Secondary | ICD-10-CM | POA: Diagnosis present

## 2020-04-02 DIAGNOSIS — E119 Type 2 diabetes mellitus without complications: Secondary | ICD-10-CM

## 2020-04-02 DIAGNOSIS — L89212 Pressure ulcer of right hip, stage 2: Secondary | ICD-10-CM | POA: Diagnosis present

## 2020-04-02 DIAGNOSIS — Z7982 Long term (current) use of aspirin: Secondary | ICD-10-CM

## 2020-04-02 DIAGNOSIS — E1022 Type 1 diabetes mellitus with diabetic chronic kidney disease: Secondary | ICD-10-CM | POA: Diagnosis present

## 2020-04-02 DIAGNOSIS — N179 Acute kidney failure, unspecified: Secondary | ICD-10-CM | POA: Diagnosis present

## 2020-04-02 DIAGNOSIS — Z515 Encounter for palliative care: Secondary | ICD-10-CM | POA: Diagnosis not present

## 2020-04-02 DIAGNOSIS — L89022 Pressure ulcer of left elbow, stage 2: Secondary | ICD-10-CM | POA: Diagnosis present

## 2020-04-02 DIAGNOSIS — Z794 Long term (current) use of insulin: Secondary | ICD-10-CM

## 2020-04-02 DIAGNOSIS — Z681 Body mass index (BMI) 19 or less, adult: Secondary | ICD-10-CM

## 2020-04-02 DIAGNOSIS — Z8616 Personal history of COVID-19: Secondary | ICD-10-CM

## 2020-04-02 DIAGNOSIS — L89013 Pressure ulcer of right elbow, stage 3: Secondary | ICD-10-CM | POA: Diagnosis present

## 2020-04-02 DIAGNOSIS — Z833 Family history of diabetes mellitus: Secondary | ICD-10-CM

## 2020-04-02 DIAGNOSIS — N1831 Chronic kidney disease, stage 3a: Secondary | ICD-10-CM | POA: Diagnosis present

## 2020-04-02 DIAGNOSIS — L89322 Pressure ulcer of left buttock, stage 2: Secondary | ICD-10-CM | POA: Diagnosis present

## 2020-04-02 DIAGNOSIS — Z8611 Personal history of tuberculosis: Secondary | ICD-10-CM

## 2020-04-02 DIAGNOSIS — L89312 Pressure ulcer of right buttock, stage 2: Secondary | ICD-10-CM | POA: Diagnosis present

## 2020-04-02 DIAGNOSIS — Z79899 Other long term (current) drug therapy: Secondary | ICD-10-CM

## 2020-04-02 LAB — GLUCOSE, CAPILLARY
Glucose-Capillary: 176 mg/dL — ABNORMAL HIGH (ref 70–99)
Glucose-Capillary: 157 mg/dL — ABNORMAL HIGH (ref 70–99)
Glucose-Capillary: 131 mg/dL — ABNORMAL HIGH (ref 70–99)
Glucose-Capillary: 147 mg/dL — ABNORMAL HIGH (ref 70–99)

## 2020-04-02 LAB — RENAL FUNCTION PANEL
Albumin: 2.5 g/dL — ABNORMAL LOW (ref 3.5–5.0)
Anion gap: 9 (ref 5–15)
BUN: 87 mg/dL — ABNORMAL HIGH (ref 6–20)
CO2: 21 mmol/L — ABNORMAL LOW (ref 22–32)
Calcium: 7.8 mg/dL — ABNORMAL LOW (ref 8.9–10.3)
Chloride: 105 mmol/L (ref 98–111)
Creatinine, Ser: 7.12 mg/dL — ABNORMAL HIGH (ref 0.61–1.24)
GFR calc Af Amer: 10 mL/min — ABNORMAL LOW (ref >60)
GFR calc non Af Amer: 8 mL/min — ABNORMAL LOW (ref >60)
Glucose, Bld: 152 mg/dL — ABNORMAL HIGH (ref 70–99)
Phosphorus: 6.8 mg/dL — ABNORMAL HIGH (ref 2.5–4.6)
Potassium: 4.7 mmol/L (ref 3.5–5.1)
Sodium: 135 mmol/L (ref 135–145)

## 2020-04-02 MED ORDER — CITALOPRAM HYDROBROMIDE 20 MG PO TABS
20.0000 mg | ORAL_TABLET | Freq: Every day | ORAL | Status: DC
  Administered 2020-04-03 – 2020-04-04 (×2): 20 mg via ORAL
  Filled 2020-04-02 (×2): qty 1

## 2020-04-02 MED ORDER — ACETAMINOPHEN 650 MG RE SUPP: 650.0000 mg | Freq: Four times a day (QID) | RECTAL | Status: DC | PRN

## 2020-04-02 MED ORDER — PANTOPRAZOLE SODIUM 40 MG PO TBEC: 40.0000 mg | DELAYED_RELEASE_TABLET | Freq: Every day | ORAL | Status: DC

## 2020-04-02 MED ORDER — COLESEVELAM HCL 625 MG PO TABS
1875.0000 mg | ORAL_TABLET | Freq: Two times a day (BID) | ORAL | Status: DC
  Administered 2020-04-02 – 2020-04-09 (×14): 1875 mg via ORAL
  Filled 2020-04-02 (×16): qty 3

## 2020-04-02 MED ORDER — CITALOPRAM HYDROBROMIDE 20 MG PO TABS: 20.0000 mg | ORAL_TABLET | Freq: Every day | ORAL | Status: DC

## 2020-04-02 MED ORDER — HEPARIN SODIUM (PORCINE) 5000 UNIT/ML IJ SOLN
5000.0000 [IU] | Freq: Three times a day (TID) | INTRAMUSCULAR | Status: DC
  Administered 2020-04-02 – 2020-04-09 (×21): 5000 [IU] via SUBCUTANEOUS
  Filled 2020-04-02 (×21): qty 1

## 2020-04-02 MED ORDER — DOCUSATE SODIUM 100 MG PO CAPS: 100.0000 mg | ORAL_CAPSULE | Freq: Two times a day (BID) | ORAL | Status: DC | PRN

## 2020-04-02 MED ORDER — ONDANSETRON HCL 4 MG/2ML IJ SOLN
4.0000 mg | Freq: Four times a day (QID) | INTRAMUSCULAR | Status: DC | PRN
  Administered 2020-04-04 – 2020-04-09 (×3): 4 mg via INTRAVENOUS
  Filled 2020-04-02 (×4): qty 2

## 2020-04-02 MED ORDER — PANTOPRAZOLE SODIUM 40 MG PO TBEC
40.0000 mg | DELAYED_RELEASE_TABLET | Freq: Every day | ORAL | Status: DC
  Administered 2020-04-03 – 2020-04-09 (×7): 40 mg via ORAL
  Filled 2020-04-02 (×7): qty 1

## 2020-04-02 MED ORDER — ASPIRIN EC 81 MG PO TBEC
81.0000 mg | DELAYED_RELEASE_TABLET | Freq: Every day | ORAL | Status: DC
  Administered 2020-04-03 – 2020-04-09 (×7): 81 mg via ORAL
  Filled 2020-04-02 (×7): qty 1

## 2020-04-02 MED ORDER — ONDANSETRON HCL 4 MG PO TABS
4.0000 mg | ORAL_TABLET | Freq: Four times a day (QID) | ORAL | Status: DC | PRN
  Administered 2020-04-03 – 2020-04-06 (×3): 4 mg via ORAL
  Filled 2020-04-02 (×3): qty 1

## 2020-04-02 MED ORDER — LOPERAMIDE HCL 2 MG PO CAPS
2.0000 mg | ORAL_CAPSULE | Freq: Two times a day (BID) | ORAL | Status: DC | PRN
  Administered 2020-04-06: 2 mg via ORAL
  Filled 2020-04-02: qty 1

## 2020-04-02 MED ORDER — ACETAMINOPHEN 325 MG PO TABS: 650.0000 mg | ORAL_TABLET | Freq: Four times a day (QID) | ORAL | Status: DC | PRN

## 2020-04-02 MED ORDER — INSULIN ASPART 100 UNIT/ML ~~LOC~~ SOLN
2.0000 [IU] | Freq: Three times a day (TID) | SUBCUTANEOUS | Status: DC
  Administered 2020-04-02 – 2020-04-09 (×21): 2 [IU] via SUBCUTANEOUS
  Filled 2020-04-02 (×21): qty 1

## 2020-04-02 MED ORDER — INSULIN ASPART 100 UNIT/ML ~~LOC~~ SOLN
0.0000 [IU] | Freq: Every day | SUBCUTANEOUS | Status: DC
  Administered 2020-04-08: 2 [IU] via SUBCUTANEOUS
  Filled 2020-04-02: qty 1

## 2020-04-02 MED ORDER — INSULIN ASPART 100 UNIT/ML ~~LOC~~ SOLN
0.0000 [IU] | Freq: Three times a day (TID) | SUBCUTANEOUS | Status: DC
  Administered 2020-04-03 – 2020-04-05 (×4): 1 [IU] via SUBCUTANEOUS
  Administered 2020-04-05 – 2020-04-06 (×2): 2 [IU] via SUBCUTANEOUS
  Administered 2020-04-06 – 2020-04-07 (×3): 1 [IU] via SUBCUTANEOUS
  Administered 2020-04-08 (×2): 2 [IU] via SUBCUTANEOUS
  Administered 2020-04-08: 1 [IU] via SUBCUTANEOUS
  Administered 2020-04-09 (×2): 2 [IU] via SUBCUTANEOUS
  Filled 2020-04-02 (×16): qty 1

## 2020-04-02 NOTE — Plan of Care (Signed)
  Problem: Education: Goal: Emotional status will improve Outcome: Progressing Goal: Mental status will improve Outcome: Progressing   Problem: Education: Goal: Mental status will improve Outcome: Progressing   Problem: Activity: Goal: Sleeping patterns will improve Outcome: Progressing   Problem: Safety: Goal: Periods of time without injury will increase Outcome: Progressing

## 2020-04-02 NOTE — Plan of Care (Signed)
D: Pt alert and oriented x 4. Pt denies experiencing any anxiety/depression at this time. Pt reports experiencing discomfort in lower abdominal area, MD ordered consult, in/out cath preformed. Pt denies experiencing any SI/HI, or AVH at this time.   A: Scheduled medications administered to pt, per MD orders. Support and encouragement provided. Frequent verbal contact made. Routine safety checks conducted q15 minutes.   R: No adverse drug reactions noted. Pt verbally contracts for safety at this time. Pt complaint with medications and treatment plan. Pt interacts well with others on the unit. Pt remains safe at this time. Will continue to monitor.   Problem: Education: Goal: Knowledge of [facility] Education information/materials will improve Outcome: Not Progressing Goal: Emotional status will improve Outcome: Not Progressing Goal: Mental status will improve Outcome: Not Progressing Goal: Verbalization of understanding the information provided will improve Outcome: Not Progressing   Problem: Activity: Goal: Interest or engagement in activities will improve Outcome: Not Progressing Goal: Sleeping patterns will improve Outcome: Not Progressing   Problem: Coping: Goal: Ability to verbalize frustrations and anger appropriately will improve Outcome: Not Progressing Goal: Ability to demonstrate self-control will improve Outcome: Not Progressing   Problem: Health Behavior/Discharge Planning: Goal: Identification of resources available to assist in meeting health care needs will improve Outcome: Not Progressing Goal: Compliance with treatment plan for underlying cause of condition will improve Outcome: Not Progressing   Problem: Physical Regulation: Goal: Ability to maintain clinical measurements within normal limits will improve Outcome: Not Progressing   Problem: Safety: Goal: Periods of time without injury will increase Outcome: Not Progressing   Problem:  Education: Goal: Utilization of techniques to improve thought processes will improve Outcome: Not Progressing Goal: Knowledge of the prescribed therapeutic regimen will improve Outcome: Not Progressing   Problem: Activity: Goal: Interest or engagement in leisure activities will improve Outcome: Not Progressing Goal: Imbalance in normal sleep/wake cycle will improve Outcome: Not Progressing   Problem: Coping: Goal: Coping ability will improve Outcome: Not Progressing Goal: Will verbalize feelings Outcome: Not Progressing   Problem: Health Behavior/Discharge Planning: Goal: Ability to make decisions will improve Outcome: Not Progressing Goal: Compliance with therapeutic regimen will improve Outcome: Not Progressing   Problem: Role Relationship: Goal: Will demonstrate positive changes in social behaviors and relationships Outcome: Not Progressing   Problem: Safety: Goal: Ability to disclose and discuss suicidal ideas will improve Outcome: Not Progressing Goal: Ability to identify and utilize support systems that promote safety will improve Outcome: Not Progressing   Problem: Self-Concept: Goal: Will verbalize positive feelings about self Outcome: Not Progressing Goal: Level of anxiety will decrease Outcome: Not Progressing
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
## 2020-04-02 NOTE — Progress Notes (Signed)
The patient was bladder during his admission to the blood bladder scan was 548ml. The patient voided and bladder scan post void was 244ml. Hospitalist has been notified.

## 2020-04-02 NOTE — Progress Notes (Signed)
Recreation Therapy Notes    Date: 04/02/2020  Time: 9:30 am   Location: Craft room    Behavioral response: N/A   Intervention Topic: Self-esteem    Discussion/Intervention: Patient did not attend group.   Clinical Observations/Feedback:  Patient did not attend group.   Devaney Segers LRT/CTRS        Branch Pacitti 04/02/2020 11:00 AM

## 2020-04-02 NOTE — BHH Group Notes (Signed)
Emotional Regulation 04/02/2020 1PM  Type of Therapy/Topic:  Group Therapy:  Emotion Regulation  Participation Level:  Did Not Attend   Description of Group:   The purpose of this group is to assist patients in learning to regulate negative emotions and experience positive emotions. Patients will be guided to discuss ways in which they have been vulnerable to their negative emotions. These vulnerabilities will be juxtaposed with experiences of positive emotions or situations, and patients will be challenged to use positive emotions to combat negative ones. Special emphasis will be placed on coping with negative emotions in conflict situations, and patients will process healthy conflict resolution skills.  Therapeutic Goals: 1. Patient will identify two positive emotions or experiences to reflect on in order to balance out negative emotions 2. Patient will label two or more emotions that they find the most difficult to experience 3. Patient will demonstrate positive conflict resolution skills through discussion and/or role plays  Summary of Patient Progress:       Therapeutic Modalities:   Cognitive Behavioral Therapy Feelings Identification Dialectical Behavioral Therapy   Yvette Rack, LCSW 04/02/2020 2:35 PM

## 2020-04-02 NOTE — Progress Notes (Signed)
D: Pt alert and oriented x 4. Pt denies experiencing any pain, SI/HI, or AVH at this time.   A: Pt and received discharge and medication education/information. Pt belongings were returned upon discharging to medical floor room 219.  R: Pt  verbalized understanding of discharge and medication education/information.  Pt escorted to room 219 by staff.

## 2020-04-02 NOTE — Progress Notes (Signed)
Report given to [facility], no further questions upon receiving report. Pt will be going to room 219

## 2020-04-02 NOTE — H&P (Signed)
History and Physical:    Gregory Moody   JIR:[id number] DOB: 03/08/1976 DOA: 04/02/2020  Referring MD/provider: Dr. Alethia Berthold PCP: [place]   Patient coming from: Psychiatry unit  Chief Complaint: Urine retention  History of Present Illness:   Gregory Moody is a 44 y.o. male significant for type 1 diabetes mellitus, gastroparesis, TB infection, COVID-19 infection, recurrent admissions for DKA intermittent urinary retention, depression, acute kidney injury, recent discharge from the hospital on 03/31/2020 after hospitalization for DKA, was referred from the behavioral unit for admission to the medical service because of urinary retention.  Per psychiatrist, patient cannot be managed in the behavioral unit with a Foley catheter for his urinary retention he also decision to transfer to the medical service for further management.  Reportedly, in and out urethral catheterization was done earlier this morning.  Patient said he last passed urine about 2 hours prior to the time of my visit.  He thinks he may be sleeping too much.  No other complaints.  No chest pain, shortness of breath, dizziness, palpitations.     ROS:   ROS all other systems reviewed were negative  Past Medical History:   Past Medical History:  Diagnosis Date  . COVID-19   . Diabetes mellitus without complication ([place])   . Gastroparesis   . Tuberculosis     Past Surgical History:   Past Surgical History:  Procedure Laterality Date  . COLONOSCOPY N/A 01/25/2020   Procedure: COLONOSCOPY;  Surgeon: Toledo, Benay Pike, MD;  Location: [facility] ENDOSCOPY;  Service: Gastroenterology;  Laterality: N/A;  . ESOPHAGOGASTRODUODENOSCOPY N/A 02/03/2019   Procedure: ESOPHAGOGASTRODUODENOSCOPY (EGD);  Surgeon: Toledo, Benay Pike, MD;  Location: [facility] ENDOSCOPY;  Service: Gastroenterology;  Laterality: N/A;    Social History:   Social History   Socioeconomic History  . Marital status:  Single    Spouse name: Not on file  . Number of children: 3  . Years of education: Not on file  . Highest education level: Not on file  Occupational History  . Occupation: unemployed   Tobacco Use  . Smoking status: Current Some Day Smoker    Types: Cigars  . Smokeless tobacco: Never Used  Vaping Use  . Vaping Use: Never used  Substance and Sexual Activity  . Alcohol use: No  . Drug use: No  . Sexual activity: Not on file  Other Topics Concern  . Not on file  Social History Narrative   Resides with daughter only weekends    Social Determinants of Health   Financial Resource Strain:   . Difficulty of Paying Living Expenses:   Food Insecurity:   . Worried About Charity fundraiser in the Last Year:   . Arboriculturist in the Last Year:   Transportation Needs:   . Film/video editor (Medical):   [name] Lack of Transportation (Non-Medical):   Physical Activity:   . Days of Exercise per Week:   . Minutes of Exercise per Session:   Stress:   . Feeling of Stress :   Social Connections:   . Frequency of Communication with Friends and Family:   . Frequency of Social Gatherings with Friends and Family:   . Attends Religious Services:   . Active Member of Clubs or Organizations:   . Attends Archivist Meetings:   [name] Marital Status:   Intimate Partner Violence:   . Fear of Current or Ex-Partner:   . Emotionally Abused:   [name] Physically Abused:   .  Sexually Abused:     Allergies   Patient has no known allergies.  Family history:   Family History  Problem Relation Age of Onset  . Diabetes Mother   . Diabetes Father     Current Medications:   Prior to Admission medications   Medication Sig Start Date End Date Taking? Authorizing Provider  aspirin EC 81 MG tablet Take 81 mg by mouth daily.    [provider]  citalopram (CELEXA) 20 MG tablet Take 1 tablet (20 mg total) by mouth daily. 04/03/20   Clapacs, Madie Reno, MD  colesevelam [facility]) 625 MG  tablet Take 3 tablets (1,875 mg total) by mouth 2 (two) times daily with a meal. 02/09/20   Loletha Grayer, MD  docusate sodium (COLACE) 100 MG capsule Take 1 capsule (100 mg total) by mouth 2 (two) times daily as needed for mild constipation. 03/28/20   Lorella Nimrod, MD  insulin aspart (NOVOLOG) 100 UNIT/ML injection Inject 0-5 Units into the skin at bedtime. 03/28/20   Lorella Nimrod, MD  insulin aspart (NOVOLOG) 100 UNIT/ML injection Inject 0-9 Units into the skin 3 (three) times daily with meals. 03/28/20   Lorella Nimrod, MD  insulin aspart (NOVOLOG) 100 UNIT/ML injection Inject 2 Units into the skin 3 (three) times daily with meals. 03/28/20   Lorella Nimrod, MD  loperamide (IMODIUM) 2 MG capsule Take 1 capsule (2 mg total) by mouth 2 (two) times daily as needed for diarrhea or loose stools. 02/09/20   Loletha Grayer, MD  pantoprazole (PROTONIX) 40 MG tablet Take 1 tablet (40 mg total) by mouth daily. 04/03/20   Clapacs, Madie Reno, MD    Physical Exam:   There were no vitals filed for this visit.   Physical Exam: There were no vitals taken for this visit. Gen: No acute distress. Head: Normocephalic, atraumatic. Eyes: Pupils equal, round and reactive to light. Extraocular movements intact.  Sclerae nonicteric.  Mouth: Moist mucous membranes Neck: Supple, no thyromegaly, no lymphadenopathy, no jugular venous distention. Chest: Lungs are clear to auscultation with good air movement. No rales, rhonchi or wheezes.  CV: Heart sounds are regular with an S1, S2. No murmurs, rubs or gallops.  Abdomen: Soft, nontender, nondistended with normal active bowel sounds. No palpable masses. Extremities: Extremities are without clubbing, or cyanosis. No edema. Pedal pulses 2+.  Skin: Warm and dry.  Stage II bilateral hip decubitus ulcers, stage II bilateral buttock decubitus ulcers, stage III decubitus ulcer on right elbow, stage II decubitus ulcer of the left elbow Neuro: Alert and oriented times 3; grossly  nonfocal.  Psych: Insight is good and judgment is appropriate. Mood and affect normal.   Data Review:    Labs: Basic Metabolic Panel: Recent Labs  Lab 03/28/20 0451 03/29/20 0614 03/30/20 0355 03/31/20 0404 04/02/20 1021  NA 132* 132* 132* 132* 135  K 4.8 4.8 4.8 5.2* 4.7  CL 107 106 106 106 105  CO2 17* 16* 16* 18* 21*  GLUCOSE 96 153* 110* 165* 152*  BUN 97* 100* 98* 101* 87*  CREATININE 7.88* 7.53* 7.77* 7.28* 7.12*  CALCIUM 7.6* 7.9* 7.8* 7.5* 7.8*  PHOS 6.1*  --   --  6.6* 6.8*   Liver Function Tests: Recent Labs  Lab 03/31/20 0404 04/02/20 1021  ALBUMIN 2.1* 2.5*   No results for input(s): LIPASE, AMYLASE in the last 168 hours. No results for input(s): AMMONIA in the last 168 hours. CBC: No results for input(s): WBC, NEUTROABS, HGB, HCT, MCV, PLT in the last  168 hours. Cardiac Enzymes: No results for input(s): CKTOTAL, CKMB, CKMBINDEX, TROPONINI in the last 168 hours.  BNP (last 3 results) No results for input(s): PROBNP in the last 8760 hours. CBG: Recent Labs  Lab 04/01/20 0703 04/01/20 1627 04/01/20 2224 04/02/20 0700 04/02/20 1139  GLUCAP 127* 180* 179* 176* 157*    Urinalysis    Component Value Date/Time   COLORURINE YELLOW (A) 03/24/2020 1701   APPEARANCEUR CLOUDY (A) 03/24/2020 1701   LABSPEC 1.010 03/24/2020 1701   PHURINE 5.0 03/24/2020 1701   GLUCOSEU >=500 (A) 03/24/2020 1701   HGBUR MODERATE (A) 03/24/2020 1701   BILIRUBINUR NEGATIVE 03/24/2020 1701   KETONESUR NEGATIVE 03/24/2020 1701   PROTEINUR 100 (A) 03/24/2020 1701   NITRITE NEGATIVE 03/24/2020 1701   LEUKOCYTESUR LARGE (A) 03/24/2020 1701      Radiographic Studies: No results found.    Assessment/Plan:   Active Problems:   Urinary retention  Acute urinary retention, intermittent: Admit to MedSurg for observation.  Avoid indwelling Foley catheter for now.  Patient has been encouraged to pass urine spontaneously.  Periodic bladder scan to monitor for urinary  retention.  Insulin-dependent diabetes mellitus: Continue 2 units of NovoLog with meals.  NovoLog as needed for hyperglycemia.  Acute kidney injury with metabolic acidosis and hyperphosphatemia: Creatinine is slowly improving.  Monitor BMP closely.  Follow-up with nephrologist.  Multiple decubitus ulcers present on admission: Continue local wound care.    Pressure Injury 12/28/19 Elbow Left Stage 2 -  Partial thickness loss of dermis presenting as a shallow open injury with a red, pink wound bed without slough. (Active)  12/28/19 1422  Location: Elbow  Location Orientation: Left  Staging: Stage 2 -  Partial thickness loss of dermis presenting as a shallow open injury with a red, pink wound bed without slough.  Wound Description (Comments):   Present on Admission: Yes (Present on transfer to 1A)     Pressure Injury 12/28/19 Elbow Right Stage 3 -  Full thickness tissue loss. Subcutaneous fat may be visible but bone, tendon or muscle are NOT exposed. (Active)  12/28/19 1422  Location: Elbow  Location Orientation: Right  Staging: Stage 3 -  Full thickness tissue loss. Subcutaneous fat may be visible but bone, tendon or muscle are NOT exposed.  Wound Description (Comments):   Present on Admission: Yes (Present on transfer to 1A)     Pressure Injury 02/07/20 (Active)  02/07/20 1411  Location:   Location Orientation:   Staging:   Wound Description (Comments):   Present on Admission: Yes     Pressure Injury 03/25/20 Hip Bilateral Stage 2 -  Partial thickness loss of dermis presenting as a shallow open injury with a red, pink wound bed without slough. (Active)  03/25/20 1900  Location: Hip  Location Orientation: Bilateral  Staging: Stage 2 -  Partial thickness loss of dermis presenting as a shallow open injury with a red, pink wound bed without slough.  Wound Description (Comments):   Present on Admission: Yes     Pressure Injury 03/25/20 Buttocks Bilateral Stage 2 -  Partial  thickness loss of dermis presenting as a shallow open injury with a red, pink wound bed without slough. (Active)  03/25/20 1900  Location: Buttocks  Location Orientation: Bilateral  Staging: Stage 2 -  Partial thickness loss of dermis presenting as a shallow open injury with a red, pink wound bed without slough.  Wound Description (Comments):   Present on Admission: Yes        There  is no height or weight on file to calculate BMI.  Other information:   DVT prophylaxis: heparin injection 5,000 Units Start: 04/02/20 2200 SCDs Start: 04/02/20 1636   Code Status: Full code. Family Communication: None Disposition Plan: Possible discharge home tomorrow Consults called: None Admission status: Observation   The medical decision making is of moderate complexity, therefore this is a level 2 visit.  Time spent  [place] [facility]   How to contact the [facility] Attending or Consulting provider [place] or covering provider during after hours [place], for this patient?   1. Check the care team in [place] and look for a) attending/consulting [facility] provider listed and b) the [facility] team listed 2. Log into [email or web address] and use [place]'s universal password to access. If you do not have the password, please contact the hospital operator. 3. Locate the [facility] provider you are looking for under [facility] and page to a number that you can be directly reached. 4. If you still have difficulty reaching the provider, please page the [facility] (Director on Call) for the Hospitalists listed on amion for assistance.  04/02/2020, 4:39 PM
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
## 2020-04-02 NOTE — Progress Notes (Signed)
[facility]  ROUNDING NOTE   Subjective:  Patient moved over to psychiatric care. Reports significant pain overlying his bladder. It appears that he is redeveloped urinary retention.   Objective:  Vital signs in last 24 hours:  Temp:  [98.7 F (37.1 C)] 98.7 F (37.1 C) (06/23 0086) Pulse Rate:  [75] 75 (06/23 0740) Resp:  [18] 18 (06/23 0633) BP: (88-149)/(62-126) 149/126 (06/23 0740) SpO2:  [100 %] 100 % (06/23 0740)  Weight change:  Filed Weights   03/31/20 2326  Weight: 57.6 kg    Intake/Output: No intake/output data recorded.   Intake/Output this shift:  No intake/output data recorded.  Physical Exam: General: No acute distress  Head: Normocephalic, atraumatic. Moist oral mucosal membranes  Eyes: Anicteric  Neck: Supple, trachea midline  Lungs:  Clear to auscultation, normal effort  Heart: S1S2 no rubs  Abdomen:  Soft, nontender, bowel sounds present  Extremities: No peripheral edema.  Neurologic: Awake, alert, following commands  Skin: No rashes noted  GU: Distended bladder    Basic Metabolic Panel: Recent Labs  Lab 03/27/20 0616 03/27/20 0616 03/28/20 0451 03/28/20 0451 03/29/20 0614 03/30/20 0355 03/31/20 0404  NA 136  --  132*  --  132* 132* 132*  K 4.9  --  4.8  --  4.8 4.8 5.2*  CL 109  --  107  --  106 106 106  CO2 18*  --  17*  --  16* 16* 18*  GLUCOSE 101*  --  96  --  153* 110* 165*  BUN 107*  --  97*  --  100* 98* 101*  CREATININE 8.09*  --  7.88*  --  7.53* 7.77* 7.28*  CALCIUM 7.9*   < > 7.6*   < > 7.9* 7.8* 7.5*  PHOS  --   --  6.1*  --   --   --  6.6*   < > = values in this interval not displayed.    Liver Function Tests: Recent Labs  Lab 03/31/20 0404  ALBUMIN 2.1*   No results for input(s): LIPASE, AMYLASE in the last 168 hours. No results for input(s): AMMONIA in the last 168 hours.  CBC: No results for input(s): WBC, NEUTROABS, HGB, HCT, MCV, PLT in the last 168 hours.  Cardiac Enzymes: No results  for input(s): CKTOTAL, CKMB, CKMBINDEX, TROPONINI in the last 168 hours.  BNP: Invalid input(s): POCBNP  CBG: Recent Labs  Lab 03/31/20 2344 04/01/20 0703 04/01/20 1627 04/01/20 2224 04/02/20 0700  GLUCAP 170* 127* 180* 179* 176*    Microbiology: Results for orders placed or performed during the hospital encounter of 03/24/20  SARS Coronavirus 2 by RT PCR (hospital order, performed in [facility] hospital lab) Nasopharyngeal Nasopharyngeal Swab     Status: None   Collection Time: 03/24/20 12:26 PM   Specimen: Nasopharyngeal Swab  Result Value Ref Range Status   SARS Coronavirus 2 NEGATIVE NEGATIVE Final    Comment: (NOTE) SARS-CoV-2 target nucleic acids are NOT DETECTED.  The SARS-CoV-2 RNA is generally detectable in upper and lower respiratory specimens during the acute phase of infection. The lowest concentration of SARS-CoV-2 viral copies this assay can detect is 250 copies / mL. A negative result does not preclude SARS-CoV-2 infection and should not be used as the sole basis for treatment or other patient management decisions.  A negative result may occur with improper specimen collection / handling, submission of specimen other than nasopharyngeal swab, presence of viral mutation(s) within the areas targeted by  this assay, and inadequate number of viral copies (<250 copies / mL). A negative result must be combined with clinical observations, patient history, and epidemiological information.  Fact Sheet for Patients:   StrictlyIdeas.no  Fact Sheet for Healthcare Providers: BankingDealers.co.za  This test is not yet approved or  cleared by the [place] FDA and has been authorized for detection and/or diagnosis of SARS-CoV-2 by FDA under an Emergency Use Authorization (EUA).  This EUA will remain in effect (meaning this test can be used) for the duration of the COVID-19 declaration under Section 564(b)(1) of the Act,  21 U.S.C. section 360bbb-3(b)(1), unless the authorization is terminated or revoked sooner.  Performed at [facility], [address] [place], [place] [address]   MRSA PCR Screening     Status: None   Collection Time: 03/25/20  6:14 AM   Specimen: Nasopharyngeal  Result Value Ref Range Status   MRSA by PCR NEGATIVE NEGATIVE Final    Comment:        The GeneXpert MRSA Assay (FDA approved for NASAL specimens only), is one component of a comprehensive MRSA colonization surveillance program. It is not intended to diagnose MRSA infection nor to guide or monitor treatment for MRSA infections. Performed at [facility], [address] [place], [place] [address]     Coagulation Studies: No results for input(s): LABPROT, INR in the last 72 hours.  Urinalysis: No results for input(s): COLORURINE, LABSPEC, PHURINE, GLUCOSEU, HGBUR, BILIRUBINUR, KETONESUR, PROTEINUR, UROBILINOGEN, NITRITE, LEUKOCYTESUR in the last 72 hours.  Invalid input(s): APPERANCEUR    Imaging: No results found.   Medications:     citalopram  20 mg Oral Daily   colesevelam  1,875 mg Oral BID WC   insulin aspart  0-5 Units Subcutaneous QHS   insulin aspart  0-9 Units Subcutaneous TID WC   pantoprazole  40 mg Oral Daily   pneumococcal 23 valent vaccine  0.5 mL Intramuscular Tomorrow-1000   alum & mag hydroxide-simeth, hydrOXYzine, magnesium hydroxide  Assessment/ Plan:  44 y.o. male insulin-dependent diabetes mellitus, gastroparesis, peripheral neuropathy, history of DKA, history of esophageal candidiasis, chronic diarrhea, C. difficile colitis, history of QuantiFERON positive in 2017 who was admitted with DKA, acute kidney injury, hypotension.  1.  Acute kidney injury.  BUN still high at 101 with a creatinine of 7.28 at last check on 03/31/2020.  Reorder renal function parameters today.  Recommend bladder scan and replacement of Foley catheter as it appears that he is  redeveloped urinary retention.  Case discussed with Dr. Weber Cooks in depth.    2.  Metabolic acidosis.  Repeat serum bicarbonate today.  Previously was on sodium bicarbonate 1300 mg p.o. 3 times daily.  3.  Hyponatremia.  Repeat serum sodium today.   LOS: 2 Gregory Moody 6/23/20219:43 AM
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
## 2020-04-03 LAB — CBC
HCT: 24.1 % — ABNORMAL LOW (ref 39.0–52.0)
Hemoglobin: 8.2 g/dL — ABNORMAL LOW (ref 13.0–17.0)
MCH: 30.5 pg (ref 26.0–34.0)
MCHC: 34 g/dL (ref 30.0–36.0)
MCV: 89.6 fL (ref 80.0–100.0)
Platelets: 277 10*3/uL (ref 150–400)
RBC: 2.69 MIL/uL — ABNORMAL LOW (ref 4.22–5.81)
RDW: 13.6 % (ref 11.5–15.5)
WBC: 6.2 10*3/uL (ref 4.0–10.5)
nRBC: 0 % (ref 0.0–0.2)

## 2020-04-03 LAB — RENAL FUNCTION PANEL
Albumin: 2.2 g/dL — ABNORMAL LOW (ref 3.5–5.0)
Anion gap: 9 (ref 5–15)
BUN: 84 mg/dL — ABNORMAL HIGH (ref 6–20)
CO2: 18 mmol/L — ABNORMAL LOW (ref 22–32)
Calcium: 7.5 mg/dL — ABNORMAL LOW (ref 8.9–10.3)
Chloride: 107 mmol/L (ref 98–111)
Creatinine, Ser: 6.7 mg/dL — ABNORMAL HIGH (ref 0.61–1.24)
GFR calc Af Amer: 11 mL/min — ABNORMAL LOW (ref >60)
GFR calc non Af Amer: 9 mL/min — ABNORMAL LOW (ref >60)
Glucose, Bld: 149 mg/dL — ABNORMAL HIGH (ref 70–99)
Phosphorus: 6 mg/dL — ABNORMAL HIGH (ref 2.5–4.6)
Potassium: 4.3 mmol/L (ref 3.5–5.1)
Sodium: 134 mmol/L — ABNORMAL LOW (ref 135–145)

## 2020-04-03 LAB — GLUCOSE, CAPILLARY
Glucose-Capillary: 141 mg/dL — ABNORMAL HIGH (ref 70–99)
Glucose-Capillary: 154 mg/dL — ABNORMAL HIGH (ref 70–99)
Glucose-Capillary: 113 mg/dL — ABNORMAL HIGH (ref 70–99)
Glucose-Capillary: 67 mg/dL — ABNORMAL LOW (ref 70–99)
Glucose-Capillary: 90 mg/dL (ref 70–99)

## 2020-04-03 MED ORDER — NEPRO/CARBSTEADY PO LIQD
237.0000 mL | Freq: Two times a day (BID) | ORAL | Status: DC
  Administered 2020-04-08: 237 mL via ORAL

## 2020-04-03 MED ORDER — ADULT MULTIVITAMIN W/MINERALS CH
1.0000 | ORAL_TABLET | Freq: Every day | ORAL | Status: DC
  Administered 2020-04-04 – 2020-04-09 (×6): 1 via ORAL
  Filled 2020-04-03 (×6): qty 1

## 2020-04-03 MED ORDER — SODIUM BICARBONATE 650 MG PO TABS
1300.0000 mg | ORAL_TABLET | Freq: Two times a day (BID) | ORAL | Status: DC
  Administered 2020-04-03 – 2020-04-09 (×13): 1300 mg via ORAL
  Filled 2020-04-03 (×13): qty 2

## 2020-04-03 NOTE — Consult Note (Addendum)
Urology Consult  I have been asked to see the patient by Dr. Mal Misty, for evaluation and management of recurrent urinary retention.  Chief Complaint: Urinary retention  History of Present Illness: Gregory Moody is a 44 y.o. year old male with PMH DM1 on insulin with multiple hospitalizations for DKA who was recently transferred from the behavioral health unit secondary to recurrent urinary retention. Patient has had recurrent urinary retention with AKIs requiring placement of multiple Foley catheters since at least 2020, most recently earlier this month. Most recent Foley was discontinued on 03/29/2020 to allow for inpatient admission to behavioral health; he was not deemed an appropriate candidate for admission with Foley in place.  Today, patient reports difficulty emptying x2 days. He denies abdominal pain. He has not seen a urologist previously for this issue.  PVR 227mL per RN.  Past Medical History:  Diagnosis Date  . COVID-19   . Diabetes mellitus without complication ([place])   . Gastroparesis   . Tuberculosis     Past Surgical History:  Procedure Laterality Date  . COLONOSCOPY N/A 01/25/2020   Procedure: COLONOSCOPY;  Surgeon: Toledo, Benay Pike, MD;  Location: [facility] ENDOSCOPY;  Service: Gastroenterology;  Laterality: N/A;  . ESOPHAGOGASTRODUODENOSCOPY N/A 02/03/2019   Procedure: ESOPHAGOGASTRODUODENOSCOPY (EGD);  Surgeon: Toledo, Benay Pike, MD;  Location: [facility] ENDOSCOPY;  Service: Gastroenterology;  Laterality: N/A;    Home Medications:  Current Meds  Medication Sig  . aspirin EC 81 MG tablet Take 81 mg by mouth daily.  . citalopram (CELEXA) 20 MG tablet Take 1 tablet (20 mg total) by mouth daily.  . colesevelam (WELCHOL) 625 MG tablet Take 3 tablets (1,875 mg total) by mouth 2 (two) times daily with a meal.  . docusate sodium (COLACE) 100 MG capsule Take 1 capsule (100 mg total) by mouth 2 (two) times daily as needed for mild constipation.  . insulin aspart  (NOVOLOG) 100 UNIT/ML injection Inject 0-5 Units into the skin at bedtime.  . insulin aspart (NOVOLOG) 100 UNIT/ML injection Inject 0-9 Units into the skin 3 (three) times daily with meals.  . insulin aspart (NOVOLOG) 100 UNIT/ML injection Inject 2 Units into the skin 3 (three) times daily with meals.  [name] loperamide (IMODIUM) 2 MG capsule Take 1 capsule (2 mg total) by mouth 2 (two) times daily as needed for diarrhea or loose stools.  . pantoprazole (PROTONIX) 40 MG tablet Take 1 tablet (40 mg total) by mouth daily.    Allergies: No Known Allergies  Family History  Problem Relation Age of Onset  . Diabetes Mother   . Diabetes Father     Social History:  reports that he has been smoking cigars. He has never used smokeless tobacco. He reports that he does not drink alcohol and does not use drugs.  ROS: A complete review of systems was performed.  All systems are negative except for pertinent findings as noted.  Physical Exam:  Vital signs in last 24 hours: Temp:  [97.7 F (36.5 C)-98.2 F (36.8 C)] 97.9 F (36.6 C) (06/24 1149) Pulse Rate:  [73-80] 76 (06/24 1149) Resp:  [18-20] 20 (06/24 1149) BP: (112-131)/(76-84) 131/84 (06/24 1149) SpO2:  [97 %-100 %] 100 % (06/24 1149) Constitutional:  Alert and oriented, no acute distress, thin HEENT: [place] AT, moist mucus membranes.  Trachea midline, no masses Cardiovascular: Regular rate and rhythm, no clubbing, cyanosis, or edema. Respiratory: Normal respiratory effort, lungs clear bilaterally Skin: No rashes, bruises or suspicious lesions Neurologic: Grossly intact, no focal deficits,  moving all 4 extremities Psychiatric: Normal mood and affect  Laboratory Data:  Recent Labs    04/03/20 0522  WBC 6.2  HGB 8.2*  HCT 24.1*   Recent Labs    04/02/20 1021 04/03/20 0522  NA 135 134*  K 4.7 4.3  CL 105 107  CO2 21* 18*  GLUCOSE 152* 149*  BUN 87* 84*  CREATININE 7.12* 6.70*  CALCIUM 7.8* 7.5*   Urinalysis    Component Value  Date/Time   COLORURINE YELLOW (A) 03/24/2020 1701   APPEARANCEUR CLOUDY (A) 03/24/2020 1701   LABSPEC 1.010 03/24/2020 1701   PHURINE 5.0 03/24/2020 1701   GLUCOSEU >=500 (A) 03/24/2020 1701   HGBUR MODERATE (A) 03/24/2020 1701   BILIRUBINUR NEGATIVE 03/24/2020 1701   KETONESUR NEGATIVE 03/24/2020 1701   PROTEINUR 100 (A) 03/24/2020 1701   NITRITE NEGATIVE 03/24/2020 1701   LEUKOCYTESUR LARGE (A) 03/24/2020 1701   Results for orders placed or performed during the hospital encounter of 03/24/20  SARS Coronavirus 2 by RT PCR (hospital order, performed in [facility] hospital lab) Nasopharyngeal Nasopharyngeal Swab     Status: None   Collection Time: 03/24/20 12:26 PM   Specimen: Nasopharyngeal Swab  Result Value Ref Range Status   SARS Coronavirus 2 NEGATIVE NEGATIVE Final    Comment: (NOTE) SARS-CoV-2 target nucleic acids are NOT DETECTED.  The SARS-CoV-2 RNA is generally detectable in upper and lower respiratory specimens during the acute phase of infection. The lowest concentration of SARS-CoV-2 viral copies this assay can detect is 250 copies / mL. A negative result does not preclude SARS-CoV-2 infection and should not be used as the sole basis for treatment or other patient management decisions.  A negative result may occur with improper specimen collection / handling, submission of specimen other than nasopharyngeal swab, presence of viral mutation(s) within the areas targeted by this assay, and inadequate number of viral copies (<250 copies / mL). A negative result must be combined with clinical observations, patient history, and epidemiological information.  Fact Sheet for Patients:   StrictlyIdeas.no  Fact Sheet for Healthcare Providers: BankingDealers.co.za  This test is not yet approved or  cleared by the [place] FDA and has been authorized for detection and/or diagnosis of SARS-CoV-2 by FDA under an Emergency Use  Authorization (EUA).  This EUA will remain in effect (meaning this test can be used) for the duration of the COVID-19 declaration under Section 564(b)(1) of the Act, 21 U.S.C. section 360bbb-3(b)(1), unless the authorization is terminated or revoked sooner.  Performed at [facility], [address] [place], [place] [address]   MRSA PCR Screening     Status: None   Collection Time: 03/25/20  6:14 AM   Specimen: Nasopharyngeal  Result Value Ref Range Status   MRSA by PCR NEGATIVE NEGATIVE Final    Comment:        The GeneXpert MRSA Assay (FDA approved for NASAL specimens only), is one component of a comprehensive MRSA colonization surveillance program. It is not intended to diagnose MRSA infection nor to guide or monitor treatment for MRSA infections. Performed at [facility], [address], [place], [place] [address]    Assessment & Plan:  44 year old diabetic male with multiple admissions for DKA as well as recurrent urinary retention with AKI requiring Foley catheterization readmitted following recent transfer to the behavioral health unit due to recurrent retention. Patient continues to void some spontaneously, however with significantly elevated PVR ~375mL today.  I had a lengthy conversation  with the patient at the bedside today with the assistance of video interpreter services. 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 I explained that this problem will not resolve on its own and that it is likely to worsen over time.  I explained that the consequences of untreated urinary retention include urinary tract infection, acute kidney injury, renal failure, and death. I again explained that failure to drain his bladder represents a life-threatening emergency.  I offered the patient chronic indwelling Foley catheterization versus clean intermittent catheterization today. I explained that a chronic  indwelling Foley would require monthly exchanges in our office. Alternatively, CIC would require him to self-catheterize several times daily with single-use catheters that our practice would set him up to receive.  Patient is interested in pursuing CIC at this time. I reiterated that the purpose of CIC is to keep his bladder drained, which will require him to be diligent about self-catheterization as prescribed. Additionally, I explained that he may have to self-cath with increasing frequency over time as his bladder condition worsens. He expressed understanding.  Will return this afternoon or tomorrow morning for CIC teaching. Recommend repeat bladder scans to ensure appropriate emptying with I&O catheterization for volumes >434mL.  Recommendations: -CIC teaching this afternoon vs. tomorrow -I&O catheterization prior to CIC teaching for bladder volumes >454mL -Outpatient follow-up for catheter supply delivery and PVR monitoring  Thank you for involving me in this patient's care, I will continue to follow along.  Debroah Loop, PA-C 04/03/2020 2:36 PM

## 2020-04-03 NOTE — Progress Notes (Addendum)
Progress Note    Ambrosio Reuter  JJK:[id number] DOB: [birth year]  DOA: 04/02/2020 PCP: [place]      Brief Narrative:    Medical records reviewed and are as summarized below:  Aster Eckrich is a 45 y.o. male       Assessment/Plan:   Active Problems:   AKI (acute kidney injury) ([place])   Urinary retention   Acute urinary retention, intermittent: Consulted urologist to assist with management.   Insulin-dependent diabetes mellitus: Continue 2 units of NovoLog with meals.  NovoLog as needed for hyperglycemia.  Acute kidney injury with metabolic acidosis and hyperphosphatemia: Creatinine is slowly improving.  Monitor BMP closely.  Follow-up with nephrologist.  Multiple decubitus ulcers present on admission: Continue local wound care.   Pressure Injury 12/28/19 Elbow Left Stage 2 -  Partial thickness loss of dermis presenting as a shallow open injury with a red, pink wound bed without slough. (Active)  12/28/19 1422  Location: Elbow  Location Orientation: Left  Staging: Stage 2 -  Partial thickness loss of dermis presenting as a shallow open injury with a red, pink wound bed without slough.  Wound Description (Comments):   Present on Admission: Yes (Present on transfer to 1A)     Pressure Injury 12/28/19 Elbow Right Stage 3 -  Full thickness tissue loss. Subcutaneous fat may be visible but bone, tendon or muscle are NOT exposed. (Active)  12/28/19 1422  Location: Elbow  Location Orientation: Right  Staging: Stage 3 -  Full thickness tissue loss. Subcutaneous fat may be visible but bone, tendon or muscle are NOT exposed.  Wound Description (Comments):   Present on Admission: Yes (Present on transfer to 1A)        Nutrition Problem: Severe Malnutrition Etiology: chronic illness (uncontrolled DM)  Signs/Symptoms: severe fat depletion, severe muscle depletion   There is no height or weight on file to calculate BMI.  Diet  Order            Diet Carb Modified Fluid consistency: Thin; Room service appropriate? Yes  Diet effective now                       Medications:   . aspirin EC  81 mg Oral Daily  . citalopram  20 mg Oral Daily  . colesevelam  1,875 mg Oral BID WC  . feeding supplement (NEPRO CARB STEADY)  237 mL Oral BID BM  . heparin  5,000 Units Subcutaneous Q8H  . insulin aspart  0-5 Units Subcutaneous QHS  . insulin aspart  0-6 Units Subcutaneous TID WC  . insulin aspart  2 Units Subcutaneous TID WC  . [START ON 04/04/2020] multivitamin with minerals  1 tablet Oral Daily  . pantoprazole  40 mg Oral Daily  . sodium bicarbonate  1,300 mg Oral BID   Continuous Infusions:   Anti-infectives (From admission, onward)   None             Family Communication/Anticipated D/C date and plan/Code Status   DVT prophylaxis: heparin injection 5,000 Units Start: 04/02/20 2200 SCDs Start: 04/02/20 1636     Code Status: Full Code  Family Communication: Plan discussed with patient Disposition Plan:    Status is: Inpatient  Remains inpatient appropriate because:Ongoing diagnostic testing needed not appropriate for outpatient work up and Inpatient level of care appropriate due to severity of illness   Dispo: The patient is from: Home  Anticipated d/c is to: Home              Anticipated d/c date is: 1 day              Patient currently is not medically stable to d/c.                Subjective:   He said he was able to pass urine earlier this morning but it was only a small amount.  No hematuria, dysuria.  Objective:    Vitals:   04/02/20 2057 04/03/20 0515 04/03/20 1000 04/03/20 1149  BP: 124/79 112/76 118/76 131/84  Pulse: 73 79 80 76  Resp: 20 20 18 20   Temp: 97.7 F (36.5 C) 98.2 F (36.8 C) 98 F (36.7 C) 97.9 F (36.6 C)  TempSrc: Oral Oral Oral Oral  SpO2: 100% 97% 98% 100%   No data found.   Intake/Output Summary (Last 24 hours)  at 04/03/2020 1703 Last data filed at 04/03/2020 1000 Gross per 24 hour  Intake --  Output 300 ml  Net -300 ml   There were no vitals filed for this visit.  Exam:  GEN: NAD SKIN: Stage II bilateral hip decubitus ulcers, stage II bilateral buttock decubitus ulcers, stage III decubitus ulcer on right elbow, stage II decubitus ulcer of the left elbow EYES: EOMI ENT: MMM CV: RRR PULM: CTA B ABD: soft, ND, NT, +BS CNS: AAO x 3, non focal EXT: No edema or tenderness   Data Reviewed:   I have personally reviewed following labs and imaging studies:  Labs: Labs show the following:   Basic Metabolic Panel: Recent Labs  Lab 03/28/20 0451 03/28/20 0451 03/29/20 6553 03/29/20 7482 03/30/20 0355 03/30/20 0355 03/31/20 0404 03/31/20 0404 04/02/20 1021 04/03/20 0522  NA 132*   < > 132*  --  132*  --  132*  --  135 134*  K 4.8   < > 4.8   < > 4.8   < > 5.2*   < > 4.7 4.3  CL 107   < > 106  --  106  --  106  --  105 107  CO2 17*   < > 16*  --  16*  --  18*  --  21* 18*  GLUCOSE 96   < > 153*  --  110*  --  165*  --  152* 149*  BUN 97*   < > 100*  --  98*  --  101*  --  87* 84*  CREATININE 7.88*   < > 7.53*  --  7.77*  --  7.28*  --  7.12* 6.70*  CALCIUM 7.6*   < > 7.9*  --  7.8*  --  7.5*  --  7.8* 7.5*  PHOS 6.1*  --   --   --   --   --  6.6*  --  6.8* 6.0*   < > = values in this interval not displayed.   GFR Estimated Creatinine Clearance: 11.5 mL/min (A) (by C-G formula based on SCr of 6.7 mg/dL (H)). Liver Function Tests: Recent Labs  Lab 03/31/20 0404 04/02/20 1021 04/03/20 0522  ALBUMIN 2.1* 2.5* 2.2*   No results for input(s): LIPASE, AMYLASE in the last 168 hours. No results for input(s): AMMONIA in the last 168 hours. Coagulation profile No results for input(s): INR, PROTIME in the last 168 hours.  CBC: Recent Labs  Lab 04/03/20 0522  WBC 6.2  HGB 8.2*  HCT 24.1*  MCV 89.6  PLT 277   Cardiac Enzymes: No results for input(s): CKTOTAL, CKMB,  CKMBINDEX, TROPONINI in the last 168 hours. BNP (last 3 results) No results for input(s): PROBNP in the last 8760 hours. CBG: Recent Labs  Lab 04/02/20 1704 04/02/20 2058 04/03/20 0753 04/03/20 1147 04/03/20 1634  GLUCAP 131* 147* 141* 154* 113*   D-Dimer: No results for input(s): DDIMER in the last 72 hours. Hgb A1c: No results for input(s): HGBA1C in the last 72 hours. Lipid Profile: No results for input(s): CHOL, HDL, LDLCALC, TRIG, CHOLHDL, LDLDIRECT in the last 72 hours. Thyroid function studies: No results for input(s): TSH, T4TOTAL, T3FREE, THYROIDAB in the last 72 hours.  Invalid input(s): FREET3 Anemia work up: No results for input(s): VITAMINB12, FOLATE, FERRITIN, TIBC, IRON, RETICCTPCT in the last 72 hours. Sepsis Labs: Recent Labs  Lab 04/03/20 0522  WBC 6.2    Microbiology Recent Results (from the past 240 hour(s))  MRSA PCR Screening     Status: None   Collection Time: 03/25/20  6:14 AM   Specimen: Nasopharyngeal  Result Value Ref Range Status   MRSA by PCR NEGATIVE NEGATIVE Final    Comment:        The GeneXpert MRSA Assay (FDA approved for NASAL specimens only), is one component of a comprehensive MRSA colonization surveillance program. It is not intended to diagnose MRSA infection nor to guide or monitor treatment for MRSA infections. Performed at [facility], [address] [place], [place] [address]     Procedures and diagnostic studies:  No results found.             LOS: 1 day   Kelliann Pendergraph  [facility]     04/03/2020, 5:03 PM

## 2020-04-03 NOTE — Progress Notes (Signed)
[facility]  ROUNDING NOTE   Subjective:  Patient moved back to inpatient care at [facility]. Creatinine is down to 6.7 with a BUN of 84. Reports less distention now.   Objective:  Vital signs in last 24 hours:  Temp:  [97.5 F (36.4 C)-98.2 F (36.8 C)] 98.2 F (36.8 C) (06/24 0515) Pulse Rate:  [72-79] 79 (06/24 0515) Resp:  [18-20] 20 (06/24 0515) BP: (79-153)/(56-98) 112/76 (06/24 0515) SpO2:  [97 %-100 %] 97 % (06/24 0515)  Weight change:  There were no vitals filed for this visit.  Intake/Output: No intake/output data recorded.   Intake/Output this shift:  No intake/output data recorded.  Physical Exam: General: No acute distress  Head: Normocephalic, atraumatic. Moist oral mucosal membranes  Eyes: Anicteric  Neck: Supple, trachea midline  Lungs:  Clear to auscultation, normal effort  Heart: S1S2 no rubs  Abdomen:  Soft, nontender, bowel sounds present  Extremities: No peripheral edema.  Neurologic: Awake, alert, following commands  Skin: No rashes noted  GU: Mildly distended bladder    Basic Metabolic Panel: Recent Labs  Lab 03/28/20 0451 03/28/20 0451 03/29/20 6599 03/29/20 3570 03/30/20 0355 03/30/20 0355 03/31/20 0404 04/02/20 1021 04/03/20 0522  NA 132*   < > 132*  --  132*  --  132* 135 134*  K 4.8   < > 4.8  --  4.8  --  5.2* 4.7 4.3  CL 107   < > 106  --  106  --  106 105 107  CO2 17*   < > 16*  --  16*  --  18* 21* 18*  GLUCOSE 96   < > 153*  --  110*  --  165* 152* 149*  BUN 97*   < > 100*  --  98*  --  101* 87* 84*  CREATININE 7.88*   < > 7.53*  --  7.77*  --  7.28* 7.12* 6.70*  CALCIUM 7.6*   < > 7.9*   < > 7.8*   < > 7.5* 7.8* 7.5*  PHOS 6.1*  --   --   --   --   --  6.6* 6.8* 6.0*   < > = values in this interval not displayed.    Liver Function Tests: Recent Labs  Lab 03/31/20 0404 04/02/20 1021 04/03/20 0522  ALBUMIN 2.1* 2.5* 2.2*   No results for input(s): LIPASE, AMYLASE in the last 168 hours. No results for  input(s): AMMONIA in the last 168 hours.  CBC: Recent Labs  Lab 04/03/20 0522  WBC 6.2  HGB 8.2*  HCT 24.1*  MCV 89.6  PLT 277    Cardiac Enzymes: No results for input(s): CKTOTAL, CKMB, CKMBINDEX, TROPONINI in the last 168 hours.  BNP: Invalid input(s): POCBNP  CBG: Recent Labs  Lab 04/02/20 0700 04/02/20 1139 04/02/20 1704 04/02/20 2058 04/03/20 0753  GLUCAP 176* 157* 131* 147* 141*    Microbiology: Results for orders placed or performed during the hospital encounter of 03/24/20  SARS Coronavirus 2 by RT PCR (hospital order, performed in [facility] hospital lab) Nasopharyngeal Nasopharyngeal Swab     Status: None   Collection Time: 03/24/20 12:26 PM   Specimen: Nasopharyngeal Swab  Result Value Ref Range Status   SARS Coronavirus 2 NEGATIVE NEGATIVE Final    Comment: (NOTE) SARS-CoV-2 target nucleic acids are NOT DETECTED.  The SARS-CoV-2 RNA is generally detectable in upper and lower respiratory specimens during the acute phase of infection. The lowest concentration of SARS-CoV-2  viral copies this assay can detect is 250 copies / mL. A negative result does not preclude SARS-CoV-2 infection and should not be used as the sole basis for treatment or other patient management decisions.  A negative result may occur with improper specimen collection / handling, submission of specimen other than nasopharyngeal swab, presence of viral mutation(s) within the areas targeted by this assay, and inadequate number of viral copies (<250 copies / mL). A negative result must be combined with clinical observations, patient history, and epidemiological information.  Fact Sheet for Patients:   StrictlyIdeas.no  Fact Sheet for Healthcare Providers: BankingDealers.co.za  This test is not yet approved or  cleared by the [place] FDA and has been authorized for detection and/or diagnosis of SARS-CoV-2 by FDA under an Emergency  Use Authorization (EUA).  This EUA will remain in effect (meaning this test can be used) for the duration of the COVID-19 declaration under Section 564(b)(1) of the Act, 21 U.S.C. section 360bbb-3(b)(1), unless the authorization is terminated or revoked sooner.  Performed at [facility], [address] [place], [place] [address]   MRSA PCR Screening     Status: None   Collection Time: 03/25/20  6:14 AM   Specimen: Nasopharyngeal  Result Value Ref Range Status   MRSA by PCR NEGATIVE NEGATIVE Final    Comment:        The GeneXpert MRSA Assay (FDA approved for NASAL specimens only), is one component of a comprehensive MRSA colonization surveillance program. It is not intended to diagnose MRSA infection nor to guide or monitor treatment for MRSA infections. Performed at [facility], [address] [place], [place] [address]     Coagulation Studies: No results for input(s): LABPROT, INR in the last 72 hours.  Urinalysis: No results for input(s): COLORURINE, LABSPEC, PHURINE, GLUCOSEU, HGBUR, BILIRUBINUR, KETONESUR, PROTEINUR, UROBILINOGEN, NITRITE, LEUKOCYTESUR in the last 72 hours.  Invalid input(s): APPERANCEUR    Imaging: No results found.   Medications:    . aspirin EC  81 mg Oral Daily  . citalopram  20 mg Oral Daily  . colesevelam  1,875 mg Oral BID WC  . heparin  5,000 Units Subcutaneous Q8H  . insulin aspart  0-5 Units Subcutaneous QHS  . insulin aspart  0-6 Units Subcutaneous TID WC  . insulin aspart  2 Units Subcutaneous TID WC  . pantoprazole  40 mg Oral Daily  . sodium bicarbonate  1,300 mg Oral BID   acetaminophen **OR** acetaminophen, docusate sodium, loperamide, ondansetron **OR** ondansetron (ZOFRAN) IV  Assessment/ Plan:  44 y.o. male insulin-dependent diabetes mellitus, gastroparesis, peripheral neuropathy, history of DKA, history of esophageal candidiasis, chronic diarrhea, C. difficile colitis, history of QuantiFERON  positive in 2017 who was admitted with DKA, acute kidney injury, hypotension.  1.  Acute kidney injury/urinary retention.  Suspect at a minimum that patient will require intermittent catheterization.  However unclear as to whether the patient will be able to perform this at home as he is already not taking his medications.  Consider urology involvement but defer to primary team.  2.  Metabolic acidosis.  Sodium bicarbonate 18.  Agree with sodium bicarbonate 1300 mg twice daily.  3.  Hyponatremia.  Serum sodium 134 at last check.  Continue to monitor.   LOS: 1 Luzelena Heeg 6/24/202110:03 AM

## 2020-04-03 NOTE — Progress Notes (Signed)
Initial Nutrition Assessment  DOCUMENTATION CODES:   Severe malnutrition in context of chronic illness  INTERVENTION:   Nepro Shake po BID, each supplement provides 425 kcal and 19 grams protein  MVI daily   NUTRITION DIAGNOSIS:   Severe Malnutrition related to chronic illness (uncontrolled DM) as evidenced by severe fat depletion, severe muscle depletion.  GOAL:   Patient will meet greater than or equal to 90% of their needs  MONITOR:   PO intake, Supplement acceptance, Labs, Weight trends, Skin, I & O's  REASON FOR ASSESSMENT:   Malnutrition Screening Tool    ASSESSMENT:   44 y.o. male significant for type 1 diabetes mellitus, gastroparesis, TB infection, COVID-19 infection, recurrent admissions for DKA intermittent urinary retention, depression, acute kidney injury, recent discharge from the hospital on 03/31/2020 after hospitalization for DKA, was referred from the behavioral unit for admission to the medical service because of urinary retention.  RD working remotely.  Pt is well known to nutrition department and this RD from multiple previous admits. Pt with chronic malnutrition and poor appetite and oral intake at home. Pt does generally eat fairly well in hospital and is usually compliant with supplements. RD will add supplements to help pt meet his estimated needs. Per chart, there is no weight documented from this admit.   Medications reviewed and include: aspirin, celexa, heparin, insulin, protonix, Na bicarbonate   Labs reviewed: Na 134(L), K 4.3 wnl, BUN 84(H), creat 6.70(H), P 6.0(H) Hgb 8.2(L), Hct 24.1(L) cbgs- 141, 154 x 24 hrs AIC 10.8(H)- 4/29  Unable to complete Nutrition-Focused physical exam at this time.   Diet Order:   Diet Order            Diet Carb Modified Fluid consistency: Thin; Room service appropriate? Yes  Diet effective now                 EDUCATION NEEDS:   No education needs have been identified at this time  Skin:  Skin  Assessment: Reviewed RN Assessment (ecchymosis)  Last BM:  6/24  Height:   Ht Readings from Last 1 Encounters:  03/25/20 5\' 8"  (1.727 m)    Weight:   Wt Readings from Last 1 Encounters:  03/29/20 60.9 kg    Ideal Body Weight:  70 kg  BMI:  There is no height or weight on file to calculate BMI.  Estimated Nutritional Needs:   Kcal:  1800-2100kcal/day  Protein:  90-105g/day  Fluid:  1.7-1.9L/day  Koleen Distance MS, RD, LDN Please refer to [facility] for RD and/or RD on-call/weekend/after hours pager

## 2020-04-04 DIAGNOSIS — E1022 Type 1 diabetes mellitus with diabetic chronic kidney disease: Secondary | ICD-10-CM

## 2020-04-04 DIAGNOSIS — Z515 Encounter for palliative care: Secondary | ICD-10-CM

## 2020-04-04 DIAGNOSIS — Z7189 Other specified counseling: Secondary | ICD-10-CM

## 2020-04-04 LAB — BASIC METABOLIC PANEL
Anion gap: 10 (ref 5–15)
BUN: 80 mg/dL — ABNORMAL HIGH (ref 6–20)
CO2: 19 mmol/L — ABNORMAL LOW (ref 22–32)
Calcium: 7.6 mg/dL — ABNORMAL LOW (ref 8.9–10.3)
Chloride: 104 mmol/L (ref 98–111)
Creatinine, Ser: 5.97 mg/dL — ABNORMAL HIGH (ref 0.61–1.24)
GFR calc Af Amer: 12 mL/min — ABNORMAL LOW (ref >60)
GFR calc non Af Amer: 11 mL/min — ABNORMAL LOW (ref >60)
Glucose, Bld: 125 mg/dL — ABNORMAL HIGH (ref 70–99)
Potassium: 4.6 mmol/L (ref 3.5–5.1)
Sodium: 133 mmol/L — ABNORMAL LOW (ref 135–145)

## 2020-04-04 LAB — GLUCOSE, CAPILLARY
Glucose-Capillary: 111 mg/dL — ABNORMAL HIGH (ref 70–99)
Glucose-Capillary: 194 mg/dL — ABNORMAL HIGH (ref 70–99)
Glucose-Capillary: 170 mg/dL — ABNORMAL HIGH (ref 70–99)
Glucose-Capillary: 111 mg/dL — ABNORMAL HIGH (ref 70–99)

## 2020-04-04 MED ORDER — CHLORHEXIDINE GLUCONATE CLOTH 2 % EX PADS
6.0000 | MEDICATED_PAD | Freq: Every day | CUTANEOUS | Status: DC
  Administered 2020-04-05 – 2020-04-07 (×3): 6 via TOPICAL

## 2020-04-04 MED ORDER — BENZTROPINE MESYLATE 0.5 MG PO TABS
0.5000 mg | ORAL_TABLET | Freq: Two times a day (BID) | ORAL | Status: DC
  Administered 2020-04-04 – 2020-04-09 (×11): 0.5 mg via ORAL
  Filled 2020-04-04 (×12): qty 1

## 2020-04-04 MED ORDER — DULOXETINE HCL 30 MG PO CPEP
30.0000 mg | ORAL_CAPSULE | Freq: Every day | ORAL | Status: DC
  Administered 2020-04-04 – 2020-04-09 (×6): 30 mg via ORAL
  Filled 2020-04-04 (×6): qty 1

## 2020-04-04 MED ORDER — HALOPERIDOL 0.5 MG PO TABS
0.5000 mg | ORAL_TABLET | Freq: Two times a day (BID) | ORAL | Status: DC
  Administered 2020-04-04 – 2020-04-09 (×11): 0.5 mg via ORAL
  Filled 2020-04-04 (×12): qty 1

## 2020-04-04 NOTE — Consult Note (Signed)
Consultation Note Date: 04/04/2020   Patient Name: Gregory Moody  DOB: July 24, 1976  MRN: [id number]  Age / Sex: 44 y.o., male  PCP: [place] Referring Physician: Jennye Boroughs, MD  Reason for Consultation: Establishing goals of care  HPI/Patient Profile: 44 y.o. male  with past medical history of T1DM, depression, TB, gastroparesis, COVID-19, severe malnutrition, and decubitus ulcers admitted on 04/02/2020 with AKI and acute urinary retention. Has most likely developed chronic urinary retention and has had chronic foley catheter placed. Has had issues taking medications as prescribed - specifically insulin.  Psych has been involved for suicidal ideation. TOC has been consulted d/t concerns about patient being unable to care for himself at home. PMT consulted for [place].  Clinical Assessment and Goals of Care: I have reviewed medical records including EPIC notes, labs and imaging, received report from RN, assessed the patient and then met with patient  to discuss diagnosis prognosis, GOC, EOL wishes, disposition and options.  Interpreter was utilized for conversation.   I introduced Palliative Medicine as specialized medical care for people living with serious illness. It focuses on providing relief from the symptoms and stress of a serious illness. The goal is to improve quality of life for both the patient and the family.  Patient tells me about his weakness - always feeling tired. Tells me he will stay in bed for multiple days in a row without getting up or eating because he feels too weak.   We discussed patient's current illness and what it means in the larger context of patient's on-going co-morbidities.  Natural disease trajectory and expectations at EOL were discussed. Patient tells me he understands his body is very sick d/t effects of T1DM. He speaks about realizing his time may be short d/t his illness.   I  attempted to elicit values and goals of care important to the patient.  He tells me he just wants to have more energy - he asks multiple times for a medication that can give him more energy.   We discussed taking his medications as prescribed is important - specifically discussed taking his insulin. We also discussed medications prescribed by psychiatry for mood.    He tells me "I want to live, I want to get better, I want to rehabilitate".  The difference between aggressive medical intervention and comfort care was considered in light of the patient's goals of care. The patient is interested in all aggressive medical interventions offered to him. We did discuss code status and he expresses desire for full code status.   Discussed with patient/family the importance of continued conversation with family and the medical providers regarding overall plan of care and treatment options, ensuring decisions are within the context of the patients values and GOCs.    Patient expresses interest in going to SNF for rehab however he does not have insurance. TOC has been consulted.   Questions and concerns were addressed. The family was encouraged to call with questions or concerns.   Primary Decision Maker PATIENT    SUMMARY OF RECOMMENDATIONS   - patient interested in all medical interventions offered to prolong life - full code/full scope - discussion had about illness - patient aware of severe illness and poor long-term prognosis - patient would like to try SNF/rehab but does not have insurance? TOC involved - feels he needs a controlled environment with physical therapy and nursing assistance to administer medications  Code Status/Advance Care Planning:  Full code  Symptom Management:   Psych  to see today - meds adjusted per psych  Discharge Planning: To Be Determined      Primary Diagnoses: Present on Admission:  Urinary retention  AKI (acute kidney injury) ([place])   I have reviewed  the medical record, interviewed the patient and family, and examined the patient. The following aspects are pertinent.  Past Medical History:  Diagnosis Date   COVID-19    Diabetes mellitus without complication ([place])    Gastroparesis    Tuberculosis    Social History   Socioeconomic History   Marital status: Single    Spouse name: Not on file   Number of children: 3   Years of education: Not on file   Highest education level: Not on file  Occupational History   Occupation: unemployed   Tobacco Use   Smoking status: Current Some Day Smoker    Types: Cigars   Smokeless tobacco: Never Used  Scientific laboratory technician Use: Never used  Substance and Sexual Activity   Alcohol use: No   Drug use: No   Sexual activity: Not on file  Other Topics Concern   Not on file  Social History Narrative   Resides with daughter only weekends    Social Determinants of Health   Financial Resource Strain:    Difficulty of Paying Living Expenses:   Food Insecurity:    Worried About Charity fundraiser in the Last Year:    Arboriculturist in the Last Year:   Transportation Needs:    Film/video editor (Medical):    Lack of Transportation (Non-Medical):   Physical Activity:    Days of Exercise per Week:    Minutes of Exercise per Session:   Stress:    Feeling of Stress :   Social Connections:    Frequency of Communication with Friends and Family:    Frequency of Social Gatherings with Friends and Family:    Attends Religious Services:    Active Member of Clubs or Organizations:    Attends Music therapist:    Marital Status:    Family History  Problem Relation Age of Onset   Diabetes Mother    Diabetes Father    Scheduled Meds:  aspirin EC  81 mg Oral Daily   benztropine  0.5 mg Oral BID   Chlorhexidine Gluconate Cloth  6 each Topical Daily   colesevelam  1,875 mg Oral BID WC   DULoxetine  30 mg Oral Daily   feeding supplement  (NEPRO CARB STEADY)  237 mL Oral BID BM   haloperidol  0.5 mg Oral BID   heparin  5,000 Units Subcutaneous Q8H   insulin aspart  0-5 Units Subcutaneous QHS   insulin aspart  0-6 Units Subcutaneous TID WC   insulin aspart  2 Units Subcutaneous TID WC   multivitamin with minerals  1 tablet Oral Daily   pantoprazole  40 mg Oral Daily   sodium bicarbonate  1,300 mg Oral BID   Continuous Infusions: PRN Meds:.acetaminophen **OR** acetaminophen, docusate sodium, loperamide, ondansetron **OR** ondansetron (ZOFRAN) IV No Known Allergies Review of Systems  Constitutional: Positive for activity change and fatigue.  Neurological: Positive for weakness.    Physical Exam Constitutional:      General: He is not in acute distress.    Comments: thin  Pulmonary:     Effort: Pulmonary effort is normal. No respiratory distress.  Skin:    General: Skin is warm and dry.  Neurological:     Mental  Status: He is alert and oriented to person, place, and time.     Vital Signs: BP (!) 141/92 (BP Location: Left Arm)    Pulse 83    Temp 99.4 F (37.4 C) (Oral)    Resp 16    SpO2 100%  Pain Scale: 0-10 POSS *See Group Information*: 1-Acceptable,Awake and alert Pain Score: 0-No pain   SpO2: SpO2: 100 % O2 Device:SpO2: 100 % O2 Flow Rate: .   IO: Intake/output summary:   Intake/Output Summary (Last 24 hours) at 04/04/2020 1509 Last data filed at 04/04/2020 1300 Gross per 24 hour  Intake 720 ml  Output 1050 ml  Net -330 ml    LBM: Last BM Date: (P) 04/04/20 Baseline Weight:   Most recent weight:       Palliative Assessment/Data: PPS 60%    Time Total: 70 minutes Greater than 50%  of this time was spent counseling and coordinating care related to the above assessment and plan.  Juel Burrow, DNP, AGNP-C Palliative Medicine Team [phone] Pager: [phone]

## 2020-04-04 NOTE — Consult Note (Signed)
[facility] Face-to-Face Psychiatry Consult   Reason for Consult:  Review again his psych issues   Already seen by me prior to Psych admission Referring Physician:  IM team  Patient Identification: Gregory Moody MRN:  [id number] Principal Diagnosis: <principal problem not specified> Diagnosis:  Active Problems:   AKI (acute kidney injury) ([place])   Urinary retention   Major depression severe recurrent Psychological factors affecting physical illness Non compliance  Parent child discord family discord   Total Time spent with patient:  30-40 minutes   Subjective:   Gregory Moody is a 44 y.o. male patient admitted with malnutrition issues with bladder retention ---chronic foley now placed back in again.  Fluctuating blood sugars and intentional non compliance with his DM care. Issues of Failure to thrive    HPI:   He was seen by me originally prior to  His psychiatry admission   See previous consult  He has major depression, family discord and he has deliberate non compliance with his DM regimen  His reasons are vague but he feels he just does not want to focus on these matters  He has discord with his 80 year old son ---and his family is not able or capable to keep up with his care either.   He has no insurance or medicaid and so cannot go to Nursing home.   He went to Psych unit, where they continued his depression medications. However he required again an indwelling foley and so he was sent back to internal medicine service       Past Psychiatric History:  None except that related to Psychological factors affecting physical illness  Risk to Self:  deliberate non compliance  Risk to Others:   none  Prior Inpatient Therapy:  just transferred from [facility] Psychiatry unit back to medicine  Prior Outpatient Therapy:  nothing consistent  Past Medical History:  Past Medical History:  Diagnosis Date  . COVID-19   . Diabetes mellitus without complication ([place])   .  Gastroparesis   . Tuberculosis     Past Surgical History:  Procedure Laterality Date  . COLONOSCOPY N/A 01/25/2020   Procedure: COLONOSCOPY;  Surgeon: Toledo, Benay Pike, MD;  Location: [facility] ENDOSCOPY;  Service: Gastroenterology;  Laterality: N/A;  . ESOPHAGOGASTRODUODENOSCOPY N/A 02/03/2019   Procedure: ESOPHAGOGASTRODUODENOSCOPY (EGD);  Surgeon: Toledo, Benay Pike, MD;  Location: [facility] ENDOSCOPY;  Service: Gastroenterology;  Laterality: N/A;   Family History:  Parents with history of depression and anxiety  Family History  Problem Relation Age of Onset  . Diabetes Mother   . Diabetes Father    Family Psychiatric  History:  See above  Social History:  Social History   Substance and Sexual Activity  Alcohol Use No     Social History   Substance and Sexual Activity  Drug Use No    Social History   Socioeconomic History  . Marital status: Single    Spouse name: Not on file  . Number of children: 3  . Years of education: Not on file  . Highest education level: Not on file  Occupational History  . Occupation: unemployed   Tobacco Use  . Smoking status: Current Some Day Smoker    Types: Cigars  . Smokeless tobacco: Never Used  Vaping Use  . Vaping Use: Never used  Substance and Sexual Activity  . Alcohol use: No  . Drug use: No  . Sexual activity: Not on file  Other Topics Concern  . Not on file  Social History Narrative  Resides with daughter only weekends    Social Determinants of Health   Financial Resource Strain:   . Difficulty of Paying Living Expenses:   Food Insecurity:   . Worried About Charity fundraiser in the Last Year:   . Arboriculturist in the Last Year:   Transportation Needs:   . Film/video editor (Medical):   [name] Lack of Transportation (Non-Medical):   Physical Activity:   . Days of Exercise per Week:   . Minutes of Exercise per Session:   Stress:   . Feeling of Stress :   Social Connections:   . Frequency of Communication with Friends  and Family:   . Frequency of Social Gatherings with Friends and Family:   . Attends Religious Services:   . Active Member of Clubs or Organizations:   . Attends Archivist Meetings:   [name] Marital Status:    Additional Social History:    Allergies:  No Known Allergies  Labs:  Results for orders placed or performed during the hospital encounter of 04/02/20 (from the past 48 hour(s))  Glucose, capillary     Status: Abnormal   Collection Time: 04/02/20  5:04 PM  Result Value Ref Range   Glucose-Capillary 131 (H) 70 - 99 mg/dL    Comment: Glucose reference range applies only to samples taken after fasting for at least 8 hours.   Comment 1 Notify RN   Glucose, capillary     Status: Abnormal   Collection Time: 04/02/20  8:58 PM  Result Value Ref Range   Glucose-Capillary 147 (H) 70 - 99 mg/dL    Comment: Glucose reference range applies only to samples taken after fasting for at least 8 hours.  CBC     Status: Abnormal   Collection Time: 04/03/20  5:22 AM  Result Value Ref Range   WBC 6.2 4.0 - 10.5 K/uL   RBC 2.69 (L) 4.22 - 5.81 MIL/uL   Hemoglobin 8.2 (L) 13.0 - 17.0 g/dL   HCT 24.1 (L) 39 - 52 %   MCV 89.6 80.0 - 100.0 fL   MCH 30.5 26.0 - 34.0 pg   MCHC 34.0 30.0 - 36.0 g/dL   RDW 13.6 11.5 - 15.5 %   Platelets 277 150 - 400 K/uL   nRBC 0.0 0.0 - 0.2 %    Comment: Performed at [facility], [address] [place], [place] [address]  Renal function panel     Status: Abnormal   Collection Time: 04/03/20  5:22 AM  Result Value Ref Range   Sodium 134 (L) 135 - 145 mmol/L   Potassium 4.3 3.5 - 5.1 mmol/L   Chloride 107 98 - 111 mmol/L   CO2 18 (L) 22 - 32 mmol/L   Glucose, Bld 149 (H) 70 - 99 mg/dL    Comment: Glucose reference range applies only to samples taken after fasting for at least 8 hours.   BUN 84 (H) 6 - 20 mg/dL   Creatinine, Ser 6.70 (H) 0.61 - 1.24 mg/dL   Calcium 7.5 (L) 8.9 - 10.3 mg/dL   Phosphorus 6.0 (H) 2.5 - 4.6 mg/dL   Albumin 2.2  (L) 3.5 - 5.0 g/dL   GFR calc non Af Amer 9 (L) >60 mL/min   GFR calc Af Amer 11 (L) >60 mL/min   Anion gap 9 5 - 15    Comment: Performed at [facility], [address] [place],  [address]  Glucose, capillary  Status: Abnormal   Collection Time: 04/03/20  7:53 AM  Result Value Ref Range   Glucose-Capillary 141 (H) 70 - 99 mg/dL    Comment: Glucose reference range applies only to samples taken after fasting for at least 8 hours.   Comment 1 Notify RN   Glucose, capillary     Status: Abnormal   Collection Time: 04/03/20 11:47 AM  Result Value Ref Range   Glucose-Capillary 154 (H) 70 - 99 mg/dL    Comment: Glucose reference range applies only to samples taken after fasting for at least 8 hours.   Comment 1 Notify RN   Glucose, capillary     Status: Abnormal   Collection Time: 04/03/20  4:34 PM  Result Value Ref Range   Glucose-Capillary 113 (H) 70 - 99 mg/dL    Comment: Glucose reference range applies only to samples taken after fasting for at least 8 hours.   Comment 1 Notify RN   Glucose, capillary     Status: Abnormal   Collection Time: 04/03/20  7:57 PM  Result Value Ref Range   Glucose-Capillary 67 (L) 70 - 99 mg/dL    Comment: Glucose reference range applies only to samples taken after fasting for at least 8 hours.  Glucose, capillary     Status: None   Collection Time: 04/03/20 10:34 PM  Result Value Ref Range   Glucose-Capillary 90 70 - 99 mg/dL    Comment: Glucose reference range applies only to samples taken after fasting for at least 8 hours.  Glucose, capillary     Status: Abnormal   Collection Time: 04/04/20  7:39 AM  Result Value Ref Range   Glucose-Capillary 111 (H) 70 - 99 mg/dL    Comment: Glucose reference range applies only to samples taken after fasting for at least 8 hours.   Comment 1 Notify RN   Basic metabolic panel     Status: Abnormal   Collection Time: 04/04/20  8:08 AM  Result Value Ref Range   Sodium 133 (L) 135 - 145 mmol/L    Potassium 4.6 3.5 - 5.1 mmol/L   Chloride 104 98 - 111 mmol/L   CO2 19 (L) 22 - 32 mmol/L   Glucose, Bld 125 (H) 70 - 99 mg/dL    Comment: Glucose reference range applies only to samples taken after fasting for at least 8 hours.   BUN 80 (H) 6 - 20 mg/dL   Creatinine, Ser 5.97 (H) 0.61 - 1.24 mg/dL   Calcium 7.6 (L) 8.9 - 10.3 mg/dL   GFR calc non Af Amer 11 (L) >60 mL/min   GFR calc Af Amer 12 (L) >60 mL/min   Anion gap 10 5 - 15    Comment: Performed at [facility], [address] [place], [place] [address]  Glucose, capillary     Status: Abnormal   Collection Time: 04/04/20 11:35 AM  Result Value Ref Range   Glucose-Capillary 194 (H) 70 - 99 mg/dL    Comment: Glucose reference range applies only to samples taken after fasting for at least 8 hours.    Current Facility-Administered Medications  Medication Dose Route Frequency Provider Last Rate Last Admin  . acetaminophen (TYLENOL) tablet 650 mg  650 mg Oral Q6H PRN Jennye Boroughs, MD       Or  . acetaminophen (TYLENOL) suppository 650 mg  650 mg Rectal Q6H PRN Jennye Boroughs, MD      . aspirin EC tablet 81 mg  81 mg Oral Daily Jennye Boroughs,  MD   81 mg at 04/04/20 0858  . Chlorhexidine Gluconate Cloth 2 % PADS 6 each  6 each Topical Daily Jennye Boroughs, MD      . citalopram (CELEXA) tablet 20 mg  20 mg Oral Daily Jennye Boroughs, MD   20 mg at 04/04/20 0858  . colesevelam [facility]) tablet 1,875 mg  1,875 mg Oral BID WC Jennye Boroughs, MD   1,875 mg at 04/04/20 0842  . docusate sodium (COLACE) capsule 100 mg  100 mg Oral BID PRN Jennye Boroughs, MD      . feeding supplement (NEPRO CARB STEADY) liquid 237 mL  237 mL Oral BID BM Jennye Boroughs, MD      . heparin injection 5,000 Units  5,000 Units Subcutaneous Q8H Jennye Boroughs, MD   5,000 Units at 04/04/20 1416  . insulin aspart (novoLOG) injection 0-5 Units  0-5 Units Subcutaneous QHS Jennye Boroughs, MD      . insulin aspart (novoLOG) injection 0-6 Units  0-6 Units  Subcutaneous TID WC Jennye Boroughs, MD   1 Units at 04/04/20 1155  . insulin aspart (novoLOG) injection 2 Units  2 Units Subcutaneous TID WC Jennye Boroughs, MD   2 Units at 04/04/20 1156  . loperamide (IMODIUM) capsule 2 mg  2 mg Oral BID PRN Jennye Boroughs, MD      . multivitamin with minerals tablet 1 tablet  1 tablet Oral Daily Jennye Boroughs, MD   1 tablet at 04/04/20 1014  . ondansetron (ZOFRAN) tablet 4 mg  4 mg Oral Q6H PRN Jennye Boroughs, MD   4 mg at 04/03/20 1640   Or  . ondansetron (ZOFRAN) injection 4 mg  4 mg Intravenous Q6H PRN Jennye Boroughs, MD   4 mg at 04/04/20 1422  . pantoprazole (PROTONIX) EC tablet 40 mg  40 mg Oral Daily Jennye Boroughs, MD   40 mg at 04/04/20 1015  . sodium bicarbonate tablet 1,300 mg  1,300 mg Oral BID Holley Raring, [place], MD   1,300 mg at 04/04/20 1014    Musculoskeletal: Strength & Muscle Tone:  Looks weak and cachectic gaunt  Gait & Station: not known  Patient leans: n/a   Psychiatric Specialty Exam: Physical Exam  Review of Systems  Blood pressure (!) 141/92, pulse 83, temperature 99.4 F (37.4 C), temperature source Oral, resp. rate 16, SpO2 100 %.There is no height or weight on file to calculate BMI.     Mental Status   Alert, somewhat cooperative [name]  Oriented to person place and time  Consciousness not clouded or fluctuant Speech low tone volume fluency Appearance --looks gaunt sickly sallow forlorn cachectic almost Mood depressed somewhat Affect somewhat constricted -- Rapport fair to poor Judgement insight reliability --poor  However he has capacity for consent He does understand the nature of his illness and the consequences of not complying with medical treatment   SI and HI --not active  No active SI or HI   He does not meet IVC criteria Concentration and attention --normal  No shakes and tremors for movement issues Abstraction ---concrete  Intelligence and Fund of knowledge below average                                                                 Treatment Plan Summary:  Patient meets capacity for consent See MS ----he seems to deliberately not want to care for Self ---He is not insured nor has Medicaid ---SW might be able to get [facility] members or others to come and offer in home support. Not clear if he can get any kind of publicly funded in home.   Patient will have to be revolving door of admits as there is nothing from psych that can help from consult side while inpatient   He can go to community mental health but again compliance is of issues  I added low dose haldol in case his thinking is altered and possibly with this medicine he might make organize thinking and make better choices  Celexa changed to Cymbalta 30 daily   Haldol added in low doses twice a day   Will add SW consult again     Disposition:  Home is pending  Psych point of view not much to support due to his state of decision  Eulas Post, MD 04/04/2020 2:39 PM

## 2020-04-04 NOTE — Progress Notes (Signed)
[facility]  ROUNDING NOTE   Subjective:  Renal function improved. Cr down to 5.97. Suspect urinary retention to be a chronic issue.    Objective:  Vital signs in last 24 hours:  Temp:  [98.2 F (36.8 C)-99.4 F (37.4 C)] 99.4 F (37.4 C) (06/25 1210) Pulse Rate:  [66-83] 83 (06/25 1210) Resp:  [16] 16 (06/25 1136) BP: (120-141)/(81-94) 141/92 (06/25 1210) SpO2:  [100 %] 100 % (06/25 1210)  Weight change:  There were no vitals filed for this visit.  Intake/Output: I/O last 3 completed shifts: In: -  Out: 1100 [Urine:1100]   Intake/Output this shift:  Total I/O In: 720 [P.O.:720] Out: 250 [Urine:250]  Physical Exam: General: Chronically ill appearing  Head: Normocephalic, atraumatic. Moist oral mucosal membranes  Eyes: Anicteric  Neck: Supple, trachea midline  Lungs:  Clear to auscultation, normal effort  Heart: S1S2 no rubs  Abdomen:  Soft, nontender, bowel sounds present  Extremities: No peripheral edema.  Neurologic: Awake, alert, following commands  Skin: No rashes noted  GU: Mildly distended bladder    Basic Metabolic Panel: Recent Labs  Lab 03/30/20 0355 03/30/20 0355 03/31/20 0404 03/31/20 0404 04/02/20 1021 04/03/20 0522 04/04/20 0808  NA 132*  --  132*  --  135 134* 133*  K 4.8  --  5.2*  --  4.7 4.3 4.6  CL 106  --  106  --  105 107 104  CO2 16*  --  18*  --  21* 18* 19*  GLUCOSE 110*  --  165*  --  152* 149* 125*  BUN 98*  --  101*  --  87* 84* 80*  CREATININE 7.77*  --  7.28*  --  7.12* 6.70* 5.97*  CALCIUM 7.8*   < > 7.5*   < > 7.8* 7.5* 7.6*  PHOS  --   --  6.6*  --  6.8* 6.0*  --    < > = values in this interval not displayed.    Liver Function Tests: Recent Labs  Lab 03/31/20 0404 04/02/20 1021 04/03/20 0522  ALBUMIN 2.1* 2.5* 2.2*   No results for input(s): LIPASE, AMYLASE in the last 168 hours. No results for input(s): AMMONIA in the last 168 hours.  CBC: Recent Labs  Lab 04/03/20 0522  WBC 6.2  HGB  8.2*  HCT 24.1*  MCV 89.6  PLT 277    Cardiac Enzymes: No results for input(s): CKTOTAL, CKMB, CKMBINDEX, TROPONINI in the last 168 hours.  BNP: Invalid input(s): POCBNP  CBG: Recent Labs  Lab 04/03/20 1634 04/03/20 1957 04/03/20 2234 04/04/20 0739 04/04/20 1135  GLUCAP 113* 67* 90 111* 194*    Microbiology: Results for orders placed or performed during the hospital encounter of 03/24/20  SARS Coronavirus 2 by RT PCR (hospital order, performed in [facility] hospital lab) Nasopharyngeal Nasopharyngeal Swab     Status: None   Collection Time: 03/24/20 12:26 PM   Specimen: Nasopharyngeal Swab  Result Value Ref Range Status   SARS Coronavirus 2 NEGATIVE NEGATIVE Final    Comment: (NOTE) SARS-CoV-2 target nucleic acids are NOT DETECTED.  The SARS-CoV-2 RNA is generally detectable in upper and lower respiratory specimens during the acute phase of infection. The lowest concentration of SARS-CoV-2 viral copies this assay can detect is 250 copies / mL. A negative result does not preclude SARS-CoV-2 infection and should not be used as the sole basis for treatment or other patient management decisions.  A negative result may occur with improper specimen  collection / handling, submission of specimen other than nasopharyngeal swab, presence of viral mutation(s) within the areas targeted by this assay, and inadequate number of viral copies (<250 copies / mL). A negative result must be combined with clinical observations, patient history, and epidemiological information.  Fact Sheet for Patients:   StrictlyIdeas.no  Fact Sheet for Healthcare Providers: BankingDealers.co.za  This test is not yet approved or  cleared by the [place] FDA and has been authorized for detection and/or diagnosis of SARS-CoV-2 by FDA under an Emergency Use Authorization (EUA).  This EUA will remain in effect (meaning this test can be used) for the  duration of the COVID-19 declaration under Section 564(b)(1) of the Act, 21 U.S.C. section 360bbb-3(b)(1), unless the authorization is terminated or revoked sooner.  Performed at [facility], [address] [place], [place] [address]   MRSA PCR Screening     Status: None   Collection Time: 03/25/20  6:14 AM   Specimen: Nasopharyngeal  Result Value Ref Range Status   MRSA by PCR NEGATIVE NEGATIVE Final    Comment:        The GeneXpert MRSA Assay (FDA approved for NASAL specimens only), is one component of a comprehensive MRSA colonization surveillance program. It is not intended to diagnose MRSA infection nor to guide or monitor treatment for MRSA infections. Performed at [facility], [address] [place], [place] [address]     Coagulation Studies: No results for input(s): LABPROT, INR in the last 72 hours.  Urinalysis: No results for input(s): COLORURINE, LABSPEC, PHURINE, GLUCOSEU, HGBUR, BILIRUBINUR, KETONESUR, PROTEINUR, UROBILINOGEN, NITRITE, LEUKOCYTESUR in the last 72 hours.  Invalid input(s): APPERANCEUR    Imaging: No results found.   Medications:    . aspirin EC  81 mg Oral Daily  . Chlorhexidine Gluconate Cloth  6 each Topical Daily  . citalopram  20 mg Oral Daily  . colesevelam  1,875 mg Oral BID WC  . feeding supplement (NEPRO CARB STEADY)  237 mL Oral BID BM  . heparin  5,000 Units Subcutaneous Q8H  . insulin aspart  0-5 Units Subcutaneous QHS  . insulin aspart  0-6 Units Subcutaneous TID WC  . insulin aspart  2 Units Subcutaneous TID WC  . multivitamin with minerals  1 tablet Oral Daily  . pantoprazole  40 mg Oral Daily  . sodium bicarbonate  1,300 mg Oral BID   acetaminophen **OR** acetaminophen, docusate sodium, loperamide, ondansetron **OR** ondansetron (ZOFRAN) IV  Assessment/ Plan:  44 y.o. male insulin-dependent diabetes mellitus, gastroparesis, peripheral neuropathy, history of DKA, history of esophageal  candidiasis, chronic diarrhea, C. difficile colitis, history of QuantiFERON positive in 2017 who was admitted with DKA, acute kidney injury, hypotension.  1.  Acute kidney injury/chronic urinary retention secondary to autonomic neuropathy.  Renal function continues to improve slowly.  Creatinine now down to 5.97.  While he was admitted under psychiatry he developed worsening urinary retention.  Appreciate urology input.  However unclear as to whether patient will perform self intermittent catheterization at home as he is already not been administering insulin to himself for underlying diabetes mellitus.  Suspect that recurring readmissions will occur.  Suggest palliative care consultation.  Discussed with hospitalist.  2.  Metabolic acidosis.  Continue sodium bicarbonate.  3.  Hyponatremia.  Serum sodium currently 133.  We will continue to monitor.   LOS: 2 Makaley Storts 6/25/20212:03 PM

## 2020-04-04 NOTE — Progress Notes (Signed)
Urology Inpatient Progress Note  Subjective: Patient required I&O catheterization overnight with output of 835mL.  He reports no acute concerns.  Current Facility-Administered Medications  Medication Dose Route Frequency Provider Last Rate Last Admin  . acetaminophen (TYLENOL) tablet 650 mg  650 mg Oral Q6H PRN Jennye Boroughs, MD       Or  . acetaminophen (TYLENOL) suppository 650 mg  650 mg Rectal Q6H PRN Jennye Boroughs, MD      . aspirin EC tablet 81 mg  81 mg Oral Daily Jennye Boroughs, MD   81 mg at 04/03/20 0938  . citalopram (CELEXA) tablet 20 mg  20 mg Oral Daily Jennye Boroughs, MD   20 mg at 04/03/20 0836  . colesevelam [facility]) tablet 1,875 mg  1,875 mg Oral BID WC Jennye Boroughs, MD   1,875 mg at 04/04/20 0842  . docusate sodium (COLACE) capsule 100 mg  100 mg Oral BID PRN Jennye Boroughs, MD      . feeding supplement (NEPRO CARB STEADY) liquid 237 mL  237 mL Oral BID BM Jennye Boroughs, MD      . heparin injection 5,000 Units  5,000 Units Subcutaneous Q8H Jennye Boroughs, MD   5,000 Units at 04/04/20 0523  . insulin aspart (novoLOG) injection 0-5 Units  0-5 Units Subcutaneous QHS Jennye Boroughs, MD      . insulin aspart (novoLOG) injection 0-6 Units  0-6 Units Subcutaneous TID WC Jennye Boroughs, MD   1 Units at 04/03/20 1226  . insulin aspart (novoLOG) injection 2 Units  2 Units Subcutaneous TID WC Jennye Boroughs, MD   2 Units at 04/04/20 0854  . loperamide (IMODIUM) capsule 2 mg  2 mg Oral BID PRN Jennye Boroughs, MD      . multivitamin with minerals tablet 1 tablet  1 tablet Oral Daily Jennye Boroughs, MD      . ondansetron [facility]) tablet 4 mg  4 mg Oral Q6H PRN Jennye Boroughs, MD   4 mg at 04/03/20 1640   Or  . ondansetron (ZOFRAN) injection 4 mg  4 mg Intravenous Q6H PRN Jennye Boroughs, MD      . pantoprazole (PROTONIX) EC tablet 40 mg  40 mg Oral Daily Jennye Boroughs, MD   40 mg at 04/03/20 0834  . sodium bicarbonate tablet 1,300 mg  1,300 mg Oral BID Lateef, Munsoor, MD    1,300 mg at 04/03/20 2136   Objective: Vital signs in last 24 hours: Temp:  [97.9 F (36.6 C)-98.4 F (36.9 C)] 98.4 F (36.9 C) (06/25 0415) Pulse Rate:  [66-80] 66 (06/25 0415) Resp:  [16-20] 16 (06/25 0415) BP: (118-131)/(76-84) 128/81 (06/25 0415) SpO2:  [98 %-100 %] 100 % (06/25 0415)  Intake/Output from previous day: 06/24 0701 - 06/25 0700 In: -  Out: 1100 [Urine:1100] Intake/Output this shift: No intake/output data recorded.  Physical Exam Vitals reviewed.  Constitutional:      General: He is not in acute distress.    Appearance: He is not toxic-appearing or diaphoretic.  HENT:     Head: Normocephalic and atraumatic.  Pulmonary:     Effort: Pulmonary effort is normal. No respiratory distress.  Genitourinary:    Penis: Uncircumcised. Phimosis present.   Skin:    General: Skin is warm and dry.  Neurological:     Mental Status: He is alert and oriented to person, place, and time.  Psychiatric:        Mood and Affect: Mood normal.        Behavior: Behavior normal.  Lab Results:  Recent Labs    04/03/20 0522  WBC 6.2  HGB 8.2*  HCT 24.1*  PLT 277   BMET Recent Labs    04/03/20 0522 04/04/20 0808  NA 134* 133*  K 4.3 4.6  CL 107 104  CO2 18* 19*  GLUCOSE 149* 125*  BUN 84* 80*  CREATININE 6.70* 5.97*  CALCIUM 7.5* 7.6*   Assessment & Plan: 44 year old diabetic male with multiple admissions for DKA as well as recurrent urinary retention with AKI requiring Foley catheterization readmitted following recent transfer to the behavioral health unit due to recurrent retention.  Patient required I&O catheterization overnight with output of 800 mL of urine.  I attempted CIC teaching at the bedside today.  Patient noted to have significant phimosis, however the urethral meatus is visualizable.  After one attempt, patient stated that the procedure was too challenging and that he would prefer chronic indwelling Foley catheterization as an alternative.  He  requests a night bag for drainage, stating that his hands are too stiff to operate the drainage mechanism on a leg bag.  I reiterated that chronic indwelling Foley catheterization means that he will have a Foley catheter in place at all times.  We will plan to have him come to clinic on a monthly basis for routine catheter exchanges to reduce his risk of infection.  He will be responsible for routine catheter maintenance, including keeping the drainage bag below the level of his bladder, emptying the drainage bag so that it does not become full, and cleaning the exposed part of the catheter tubing with warm, soapy water once daily.  Patient expressed understanding but requests nursing to further instruct him on catheter maintenance.  Recommendations: -Place Foley at the bedside today (order placed) -Catheter care teaching -We will arrange routine catheter change in our clinic in 1 month  Debroah Loop, PA-C 04/04/2020

## 2020-04-04 NOTE — Progress Notes (Signed)
Patient has bladder scans q4hrs. At 0000 patient had 518 ml in bladder and refused to try to urinate in the urinal or get up to the bathroom. He said to give him more time. At 0400 bladder scan patient had 857 ml in his bladder.he again refused to use urinal or get up to the bathroom. I contacted Rufina Falco, NP for an order for in and out cath. After in and out cath urine out was 800 ml.

## 2020-04-04 NOTE — Progress Notes (Addendum)
Progress Note    Gregory Moody  ZOX:[id number] DOB: 02/18/1976  DOA: 04/02/2020 PCP: [facility]      Brief Narrative:    Medical records reviewed and are as summarized below:  Gregory Moody is a 44 y.o. male       Assessment/Plan:   Active Problems:   AKI (acute kidney injury) ([place])   Urinary retention   Acute urinary retention, recurrent, more likely developed chronic urinary retention: Patient has been evaluated by urologist.  Initially plan was to have patient use clean intermittent catheterization.  However, Foley catheter has now been placed for urinary retention.  Insulin-dependent diabetes mellitus: Continue 2 units of NovoLog with meals.  NovoLog as needed for hyperglycemia.  Acute kidney injury with metabolic acidosis and hyperphosphatemia: Creatinine is slowly improving.  Monitor BMP closely.  Plan was discussed with nephrologist.  Depression: Continue Celexa.  Consulted psychiatrist, Dr. Janese Banks, to reevaluate the patient because of recent suicidal thoughts  Multiple decubitus ulcers present on admission: Continue local wound care.     Patient is unable to care for himself at home.  Consult social worker to assist with disposition.  Palliative care team has also been consulted to discuss goals of care.    Pressure Injury 12/28/19 Elbow Left Stage 2 -  Partial thickness loss of dermis presenting as a shallow open injury with a red, pink wound bed without slough. (Active)  12/28/19 1422  Location: Elbow  Location Orientation: Left  Staging: Stage 2 -  Partial thickness loss of dermis presenting as a shallow open injury with a red, pink wound bed without slough.  Wound Description (Comments):   Present on Admission: Yes (Present on transfer to 1A)     Pressure Injury 12/28/19 Elbow Right Stage 3 -  Full thickness tissue loss. Subcutaneous fat may be visible but bone, tendon or muscle are NOT exposed. (Active)    12/28/19 1422  Location: Elbow  Location Orientation: Right  Staging: Stage 3 -  Full thickness tissue loss. Subcutaneous fat may be visible but bone, tendon or muscle are NOT exposed.  Wound Description (Comments):   Present on Admission: Yes (Present on transfer to 1A)        Nutrition Problem: Severe Malnutrition Etiology: chronic illness (uncontrolled DM)  Signs/Symptoms: severe fat depletion, severe muscle depletion   There is no height or weight on file to calculate BMI.  Diet Order            Diet Carb Modified Fluid consistency: Thin; Room service appropriate? Yes  Diet effective now                       Medications:   . aspirin EC  81 mg Oral Daily  . Chlorhexidine Gluconate Cloth  6 each Topical Daily  . citalopram  20 mg Oral Daily  . colesevelam  1,875 mg Oral BID WC  . feeding supplement (NEPRO CARB STEADY)  237 mL Oral BID BM  . heparin  5,000 Units Subcutaneous Q8H  . insulin aspart  0-5 Units Subcutaneous QHS  . insulin aspart  0-6 Units Subcutaneous TID WC  . insulin aspart  2 Units Subcutaneous TID WC  . multivitamin with minerals  1 tablet Oral Daily  . pantoprazole  40 mg Oral Daily  . sodium bicarbonate  1,300 mg Oral BID   Continuous Infusions:   Anti-infectives (From admission, onward)   None  Family Communication/Anticipated D/C date and plan/Code Status   DVT prophylaxis: heparin injection 5,000 Units Start: 04/02/20 2200 SCDs Start: 04/02/20 1636     Code Status: Full Code  Family Communication: Plan discussed with patient Disposition Plan:    Status is: Inpatient  Remains inpatient appropriate because:Unsafe d/c plan and Inpatient level of care appropriate due to severity of illness   Dispo: The patient is from: Home              Anticipated d/c is to: Home              Anticipated d/c date is: 3 days              Patient currently is not medically stable to  d/c.                Subjective:   He has no complaints.  He said he is not depressed or suicidal.  Although patient can speak and understand English, history was obtained using Spanish interpretation services via iPad.  Objective:    Vitals:   04/03/20 1954 04/04/20 0415 04/04/20 1136 04/04/20 1210  BP: 120/84 128/81 (!) 139/94 (!) 141/92  Pulse: 72 66 74 83  Resp: 16 16 16    Temp: 98.2 F (36.8 C) 98.4 F (36.9 C) 98.6 F (37 C) 99.4 F (37.4 C)  TempSrc: Oral Oral Oral Oral  SpO2: 100% 100% 100% 100%   No data found.   Intake/Output Summary (Last 24 hours) at 04/04/2020 1220 Last data filed at 04/04/2020 7416 Gross per 24 hour  Intake 240 ml  Output 1050 ml  Net -810 ml   There were no vitals filed for this visit.  Exam:  GEN: NAD SKIN: Stage II bilateral hip decubitus ulcers, stage II bilateral buttock decubitus ulcers, stage III decubitus ulcer on right elbow, stage II decubitus ulcer of the left elbow EYES: EOMI ENT: MMM CV: RRR PULM: No wheezing or rales heard ABD: soft, ND, NT, +BS CNS: AAO x 3, non focal EXT: No edema or tenderness PSYCH: Calm and cooperative.   Data Reviewed:   I have personally reviewed following labs and imaging studies:  Labs: Labs show the following:   Basic Metabolic Panel: Recent Labs  Lab 03/30/20 0355 03/30/20 0355 03/31/20 0404 03/31/20 0404 04/02/20 1021 04/02/20 1021 04/03/20 0522 04/04/20 0808  NA 132*  --  132*  --  135  --  134* 133*  K 4.8   < > 5.2*   < > 4.7   < > 4.3 4.6  CL 106  --  106  --  105  --  107 104  CO2 16*  --  18*  --  21*  --  18* 19*  GLUCOSE 110*  --  165*  --  152*  --  149* 125*  BUN 98*  --  101*  --  87*  --  84* 80*  CREATININE 7.77*  --  7.28*  --  7.12*  --  6.70* 5.97*  CALCIUM 7.8*  --  7.5*  --  7.8*  --  7.5* 7.6*  PHOS  --   --  6.6*  --  6.8*  --  6.0*  --    < > = values in this interval not displayed.   GFR Estimated Creatinine Clearance: 12.9 mL/min (A)  (by C-G formula based on SCr of 5.97 mg/dL (H)). Liver Function Tests: Recent Labs  Lab 03/31/20 0404 04/02/20 1021 04/03/20 0522  ALBUMIN 2.1*  2.5* 2.2*   No results for input(s): LIPASE, AMYLASE in the last 168 hours. No results for input(s): AMMONIA in the last 168 hours. Coagulation profile No results for input(s): INR, PROTIME in the last 168 hours.  CBC: Recent Labs  Lab 04/03/20 0522  WBC 6.2  HGB 8.2*  HCT 24.1*  MCV 89.6  PLT 277   Cardiac Enzymes: No results for input(s): CKTOTAL, CKMB, CKMBINDEX, TROPONINI in the last 168 hours. BNP (last 3 results) No results for input(s): PROBNP in the last 8760 hours. CBG: Recent Labs  Lab 04/03/20 1634 04/03/20 1957 04/03/20 2234 04/04/20 0739 04/04/20 1135  GLUCAP 113* 67* 90 111* 194*   D-Dimer: No results for input(s): DDIMER in the last 72 hours. Hgb A1c: No results for input(s): HGBA1C in the last 72 hours. Lipid Profile: No results for input(s): CHOL, HDL, LDLCALC, TRIG, CHOLHDL, LDLDIRECT in the last 72 hours. Thyroid function studies: No results for input(s): TSH, T4TOTAL, T3FREE, THYROIDAB in the last 72 hours.  Invalid input(s): FREET3 Anemia work up: No results for input(s): VITAMINB12, FOLATE, FERRITIN, TIBC, IRON, RETICCTPCT in the last 72 hours. Sepsis Labs: Recent Labs  Lab 04/03/20 0522  WBC 6.2    Microbiology No results found for this or any previous visit (from the past 240 hour(s)).  Procedures and diagnostic studies:  No results found.             LOS: 2 days   Beulah Matusek  [facility]     04/04/2020, 12:20 PM

## 2020-04-05 LAB — GLUCOSE, CAPILLARY
Glucose-Capillary: 169 mg/dL — ABNORMAL HIGH (ref 70–99)
Glucose-Capillary: 207 mg/dL — ABNORMAL HIGH (ref 70–99)
Glucose-Capillary: 124 mg/dL — ABNORMAL HIGH (ref 70–99)
Glucose-Capillary: 109 mg/dL — ABNORMAL HIGH (ref 70–99)

## 2020-04-05 NOTE — Progress Notes (Signed)
Progress Note    Gregory Moody  IYM:[id number] DOB: 08/29/1976  DOA: 04/02/2020 PCP: [place]      Brief Narrative:    Medical records reviewed and are as summarized below:  Gregory Moody is a 44 y.o. male       Assessment/Plan:   Active Problems:   AKI (acute kidney injury) ([place])   Urinary retention   Type 1 diabetes mellitus with diabetic chronic kidney disease ([place])   Goals of care, counseling/discussion   Palliative care by specialist   Acute urinary retention, recurrent, more likely developed chronic urinary retention: Patient has been evaluated by urologist. Continue indwelling Foley catheter.  Insulin-dependent diabetes mellitus: Continue 2 units of NovoLog with meals.  NovoLog as needed for hyperglycemia.  Acute kidney injury with metabolic acidosis and hyperphosphatemia: Creatinineand phosphorus levels are slowly improving. Monitor BMP closely.   Depression: Continue Celexa.  Psychiatrist has signed off the case because patient is mentally stable and has no suicidal thoughts.   Multiple decubitus ulcers present on admission (stage II decubitus ulcer on right elbow, healing decubitus ulcer on the left elbow) : Continue local wound care.   Patient is unable to care for himself at home. Follow up with social worker to assist with disposition.  Follow up with palliative care team.   Nutrition Problem: Severe Malnutrition Etiology: chronic illness (uncontrolled DM)  Signs/Symptoms: severe fat depletion, severe muscle depletion   Body mass index is 19.61 kg/m.  (Underweight): Encouraged adequate oral intake.  Follow-up with dietitian.  Diet Order            Diet Carb Modified Fluid consistency: Thin; Room service appropriate? Yes  Diet effective now                       Medications:   . aspirin EC  81 mg Oral Daily  . benztropine  0.5 mg Oral BID  . Chlorhexidine Gluconate Cloth  6 each Topical  Daily  . colesevelam  1,875 mg Oral BID WC  . DULoxetine  30 mg Oral Daily  . feeding supplement (NEPRO CARB STEADY)  237 mL Oral BID BM  . haloperidol  0.5 mg Oral BID  . heparin  5,000 Units Subcutaneous Q8H  . insulin aspart  0-5 Units Subcutaneous QHS  . insulin aspart  0-6 Units Subcutaneous TID WC  . insulin aspart  2 Units Subcutaneous TID WC  . multivitamin with minerals  1 tablet Oral Daily  . pantoprazole  40 mg Oral Daily  . sodium bicarbonate  1,300 mg Oral BID   Continuous Infusions:   Anti-infectives (From admission, onward)   None             Family Communication/Anticipated D/C date and plan/Code Status   DVT prophylaxis: heparin injection 5,000 Units Start: 04/02/20 2200 SCDs Start: 04/02/20 1636     Code Status: Full Code  Family Communication: Plan discussed with patient Disposition Plan:    Status is: Inpatient  Remains inpatient appropriate because:Unsafe d/c plan   Dispo: The patient is from: Home              Anticipated d/c is to: Home              Anticipated d/c date is: 3 days              Patient currently is medically stable to d/c.  Subjective:   He has no complaints.  He feels better today.  No abdominal pain, chest pain or shortness of breath.  Objective:    Vitals:   04/04/20 1210 04/04/20 1959 04/04/20 2200 04/05/20 0415  BP: (!) 141/92 121/82  (!) 142/93  Pulse: 83 66  73  Resp:  20  20  Temp: 99.4 F (37.4 C) 98.6 F (37 C)  98.1 F (36.7 C)  TempSrc: Oral Oral  Oral  SpO2: 100% 100%  100%  Weight:   58.5 kg   Height:   5\' 8"  (1.727 m)    No data found.   Intake/Output Summary (Last 24 hours) at 04/05/2020 1157 Last data filed at 04/04/2020 2031 Gross per 24 hour  Intake 480 ml  Output 900 ml  Net -420 ml   Filed Weights   04/04/20 2200  Weight: 58.5 kg    Exam:  GEN: NAD SKIN: stage II decubitus ulcer on right elbow, healing decubitus ulcer on the left elbow EYES:  EOMI ENT: MMM CV: RRR PULM: No wheezing or rales heard ABD: soft, ND, NT, +BS CNS: AAO x 3, non focal EXT: No edema or tenderness PSYCH: Calm and cooperative.   Data Reviewed:   I have personally reviewed following labs and imaging studies:  Labs: Labs show the following:   Basic Metabolic Panel: Recent Labs  Lab 03/30/20 0355 03/30/20 0355 03/31/20 0404 03/31/20 0404 04/02/20 1021 04/02/20 1021 04/03/20 0522 04/04/20 0808  NA 132*  --  132*  --  135  --  134* 133*  K 4.8   < > 5.2*   < > 4.7   < > 4.3 4.6  CL 106  --  106  --  105  --  107 104  CO2 16*  --  18*  --  21*  --  18* 19*  GLUCOSE 110*  --  165*  --  152*  --  149* 125*  BUN 98*  --  101*  --  87*  --  84* 80*  CREATININE 7.77*  --  7.28*  --  7.12*  --  6.70* 5.97*  CALCIUM 7.8*  --  7.5*  --  7.8*  --  7.5* 7.6*  PHOS  --   --  6.6*  --  6.8*  --  6.0*  --    < > = values in this interval not displayed.   GFR Estimated Creatinine Clearance: 13.1 mL/min (A) (by C-G formula based on SCr of 5.97 mg/dL (H)). Liver Function Tests: Recent Labs  Lab 03/31/20 0404 04/02/20 1021 04/03/20 0522  ALBUMIN 2.1* 2.5* 2.2*   No results for input(s): LIPASE, AMYLASE in the last 168 hours. No results for input(s): AMMONIA in the last 168 hours. Coagulation profile No results for input(s): INR, PROTIME in the last 168 hours.  CBC: Recent Labs  Lab 04/03/20 0522  WBC 6.2  HGB 8.2*  HCT 24.1*  MCV 89.6  PLT 277   Cardiac Enzymes: No results for input(s): CKTOTAL, CKMB, CKMBINDEX, TROPONINI in the last 168 hours. BNP (last 3 results) No results for input(s): PROBNP in the last 8760 hours. CBG: Recent Labs  Lab 04/04/20 0739 04/04/20 1135 04/04/20 1639 04/04/20 2148 04/05/20 0746  GLUCAP 111* 194* 170* 111* 169*   D-Dimer: No results for input(s): DDIMER in the last 72 hours. Hgb A1c: No results for input(s): HGBA1C in the last 72 hours. Lipid Profile: No results for input(s): CHOL, HDL,  LDLCALC, TRIG, CHOLHDL, LDLDIRECT  in the last 72 hours. Thyroid function studies: No results for input(s): TSH, T4TOTAL, T3FREE, THYROIDAB in the last 72 hours.  Invalid input(s): FREET3 Anemia work up: No results for input(s): VITAMINB12, FOLATE, FERRITIN, TIBC, IRON, RETICCTPCT in the last 72 hours. Sepsis Labs: Recent Labs  Lab 04/03/20 0522  WBC 6.2    Microbiology No results found for this or any previous visit (from the past 240 hour(s)).  Procedures and diagnostic studies:  No results found.             LOS: 3 days   Tahnee Cifuentes  [facility]     04/05/2020, 11:57 AM

## 2020-04-05 NOTE — Progress Notes (Signed)
Gregory Moody  MRN: [id number]  DOB/AGE: 04-09-1976 44 y.o.  Primary [place] date: 04/02/2020  Chief Complaint: No chief complaint on file.   S-Pt presented on  04/02/2020 with No chief complaint on file. .    Pt today feels better  Medications . aspirin EC  81 mg Oral Daily  . benztropine  0.5 mg Oral BID  . Chlorhexidine Gluconate Cloth  6 each Topical Daily  . colesevelam  1,875 mg Oral BID WC  . DULoxetine  30 mg Oral Daily  . feeding supplement (NEPRO CARB STEADY)  237 mL Oral BID BM  . haloperidol  0.5 mg Oral BID  . heparin  5,000 Units Subcutaneous Q8H  . insulin aspart  0-5 Units Subcutaneous QHS  . insulin aspart  0-6 Units Subcutaneous TID WC  . insulin aspart  2 Units Subcutaneous TID WC  . multivitamin with minerals  1 tablet Oral Daily  . pantoprazole  40 mg Oral Daily  . sodium bicarbonate  1,300 mg Oral BID         [id number] from the symptoms mentioned above,there are no other symptoms referable to all systems reviewed.  Physical Exam: Vital signs in last 24 hours: Temp:  [98.1 F (36.7 C)-99.4 F (37.4 C)] 98.1 F (36.7 C) (06/26 0415) Pulse Rate:  [66-83] 73 (06/26 0415) Resp:  [16-20] 20 (06/26 0415) BP: (121-142)/(82-94) 142/93 (06/26 0415) SpO2:  [100 %] 100 % (06/26 0415) Weight:  [58.5 kg] 58.5 kg (06/25 2200) Weight change:  Last BM Date: 04/04/20  Intake/Output from previous day: 06/25 0701 - 06/26 0700 In: 720 [P.O.:720] Out: 1150 [Urine:1150] No intake/output data recorded.   Physical Exam: General- pt is awake,alert, oriented to time place and person Resp- No acute REsp distress, CTA B/L NO Rhonchi CVS- S1S2 regular in rate and rhythm GIT- BS+, soft, NT, ND EXT- NO LE Edema, Cyanosis   Lab Results: CBC Recent Labs    04/03/20 0522  WBC 6.2  HGB 8.2*  HCT 24.1*  PLT 277    BMET Recent Labs    04/03/20 0522 04/04/20 0808  NA 134* 133*  K 4.3 4.6  CL 107 104   CO2 18* 19*  GLUCOSE 149* 125*  BUN 84* 80*  CREATININE 6.70* 5.97*  CALCIUM 7.5* 7.6*    Creatinine trend 2021 12.9==>5.9 1.0--3.1-AKI in March admission 2020 1.6--7.75-AKI  MICRO No results found for this or any previous visit (from the past 240 hour(s)).    Lab Results  Component Value Date   PTH 23 04/17/2017   PTH Comment 04/17/2017   CALCIUM 7.6 (L) 04/04/2020   PHOS 6.0 (H) 04/03/2020               Impression:  Patient is a 44 year old male with past medical history of diabetes mellitus type 1, gastroparesis, peripheral neuropathy, history of DKA, history of esophageal candidiasis, history of chronic diarrhea, history of QuantiFERON being positive in 2017 who was admitted to the hospital with chief complaint of DKA, acute kidney injury and hypotension  1)Renal  AKI secondary to obstructive uropathy                AKI on CKD               CKD stage 2/3.               CKD since 2020               CKD  secondary to diabetes mellitus/obstructive uropathy                Progression of CKD marked with multiple episodes of AKI in 2020 and now in 2021                              AKI now better                 Creatinine trending down   2)HTN Blood pressure stable Patient is not on any antihypertensives  3)Anemia of chronic disease  HGb at goal (9--11)   4) secondary hyperparathyroidism -CKD Mineral-Bone Disorder   Secondary Hyperparathyroidism present .  Phosphorus is not at goal. 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 bicarb    Plan:   We will continue current treatment plan    Tylicia Sherman s [facility] 04/05/2020, 10:04 AM

## 2020-04-06 LAB — BASIC METABOLIC PANEL
Anion gap: 8 (ref 5–15)
BUN: 64 mg/dL — ABNORMAL HIGH (ref 6–20)
CO2: 21 mmol/L — ABNORMAL LOW (ref 22–32)
Calcium: 8.1 mg/dL — ABNORMAL LOW (ref 8.9–10.3)
Chloride: 105 mmol/L (ref 98–111)
Creatinine, Ser: 5.08 mg/dL — ABNORMAL HIGH (ref 0.61–1.24)
GFR calc Af Amer: 15 mL/min — ABNORMAL LOW (ref >60)
GFR calc non Af Amer: 13 mL/min — ABNORMAL LOW (ref >60)
Glucose, Bld: 162 mg/dL — ABNORMAL HIGH (ref 70–99)
Potassium: 5.1 mmol/L (ref 3.5–5.1)
Sodium: 134 mmol/L — ABNORMAL LOW (ref 135–145)

## 2020-04-06 LAB — GLUCOSE, CAPILLARY
Glucose-Capillary: 147 mg/dL — ABNORMAL HIGH (ref 70–99)
Glucose-Capillary: 170 mg/dL — ABNORMAL HIGH (ref 70–99)
Glucose-Capillary: 188 mg/dL — ABNORMAL HIGH (ref 70–99)
Glucose-Capillary: 210 mg/dL — ABNORMAL HIGH (ref 70–99)
Glucose-Capillary: 117 mg/dL — ABNORMAL HIGH (ref 70–99)

## 2020-04-06 LAB — CBC
HCT: 24.2 % — ABNORMAL LOW (ref 39.0–52.0)
Hemoglobin: 8.5 g/dL — ABNORMAL LOW (ref 13.0–17.0)
MCH: 30.5 pg (ref 26.0–34.0)
MCHC: 35.1 g/dL (ref 30.0–36.0)
MCV: 86.7 fL (ref 80.0–100.0)
Platelets: 276 10*3/uL (ref 150–400)
RBC: 2.79 MIL/uL — ABNORMAL LOW (ref 4.22–5.81)
RDW: 13.2 % (ref 11.5–15.5)
WBC: 6.7 10*3/uL (ref 4.0–10.5)
nRBC: 0 % (ref 0.0–0.2)

## 2020-04-06 LAB — PHOSPHORUS: Phosphorus: 5.1 mg/dL — ABNORMAL HIGH (ref 2.5–4.6)

## 2020-04-06 NOTE — Progress Notes (Addendum)
Progress Note    Gregory Moody  OYD:[id number] DOB: Feb 19, 1976  DOA: 04/02/2020 PCP: [place]      Brief Narrative:    Medical records reviewed and are as summarized below:  Gregory Moody is a 44 y.o. male       Assessment/Plan:   Active Problems:   AKI (acute kidney injury) ([place])   Urinary retention   Type 1 diabetes mellitus with diabetic chronic kidney disease ([place])   Goals of care, counseling/discussion   Palliative care by specialist   Acute urinary retention, recurrent, more likely developed chronic urinary retention: Patient has been evaluated by urologist. Continue indwelling Foley catheter.  Insulin-dependent diabetes mellitus: Continue 2 units of NovoLog with meals.  NovoLog as needed for hyperglycemia.  Acute kidney injury with metabolic acidosis and hyperphosphatemia: Creatinine and phosphorus levels are slowly improving.  Creatinine was 1.1 on February 07, 2020. Monitor BMP closely.  Follow-up with nephrologist.  Depression: Continue Celexa.  Psychiatrist has signed off the case because patient is mentally stable and has no suicidal thoughts.   Multiple decubitus ulcers present on admission (stage II decubitus ulcer on right elbow, healing decubitus ulcer on the left elbow) : Continue local wound care.   Patient is unable to care for himself at home. Follow up with social worker to assist with disposition.  Follow up with palliative care team.  PT and OT evaluation   Nutrition Problem: Severe Malnutrition Etiology: chronic illness (uncontrolled DM)  Signs/Symptoms: severe fat depletion, severe muscle depletion   Body mass index is 19.61 kg/m.  (Underweight): Encouraged adequate oral intake.  Follow-up with dietitian.  Diet Order            Diet Carb Modified Fluid consistency: Thin; Room service appropriate? Yes  Diet effective now                       Medications:   . aspirin EC  81 mg  Oral Daily  . benztropine  0.5 mg Oral BID  . Chlorhexidine Gluconate Cloth  6 each Topical Daily  . colesevelam  1,875 mg Oral BID WC  . DULoxetine  30 mg Oral Daily  . feeding supplement (NEPRO CARB STEADY)  237 mL Oral BID BM  . haloperidol  0.5 mg Oral BID  . heparin  5,000 Units Subcutaneous Q8H  . insulin aspart  0-5 Units Subcutaneous QHS  . insulin aspart  0-6 Units Subcutaneous TID WC  . insulin aspart  2 Units Subcutaneous TID WC  . multivitamin with minerals  1 tablet Oral Daily  . pantoprazole  40 mg Oral Daily  . sodium bicarbonate  1,300 mg Oral BID   Continuous Infusions:   Anti-infectives (From admission, onward)   None             Family Communication/Anticipated D/C date and plan/Code Status   DVT prophylaxis: heparin injection 5,000 Units Start: 04/02/20 2200 SCDs Start: 04/02/20 1636     Code Status: Full Code  Family Communication: Plan discussed with patient Disposition Plan:    Status is: Inpatient  Remains inpatient appropriate because:Unsafe d/c plan   Dispo: The patient is from: Home              Anticipated d/c is to: Home              Anticipated d/c date is: 2 days              Patient currently  is medically stable to d/c.                Subjective:   He said he feels a little better today.  He also said he is feeling a little stronger but not strong enough to go home   Objective:    Vitals:   04/05/20 0415 04/05/20 1252 04/05/20 1929 04/06/20 0434  BP: (!) 142/93 120/85 (!) 151/95 139/90  Pulse: 73 (!) 105 79 75  Resp: 20 16 16 14   Temp: 98.1 F (36.7 C) (!) 97.5 F (36.4 C) 97.8 F (36.6 C) 97.8 F (36.6 C)  TempSrc: Oral Oral Oral Oral  SpO2: 100% 100% 100% 100%  Weight:      Height:       No data found.   Intake/Output Summary (Last 24 hours) at 04/06/2020 1033 Last data filed at 04/06/2020 0453 Gross per 24 hour  Intake 720 ml  Output 2400 ml  Net -1680 ml   Filed Weights   04/04/20  2200  Weight: 58.5 kg    Exam:  GEN: No acute distress SKIN: stage II decubitus ulcer on right elbow, healing decubitus ulcer on the left elbow EYES: No pallor or icterus ENT: MMM CV: RRR PULM: No wheezing or rales heard ABD: soft, ND, NT, +BS CNS: AAO x 3, non focal EXT: No edema or tenderness GU: Foley catheter draining amber urine PSYCH: Calm and cooperative.   Data Reviewed:   I have personally reviewed following labs and imaging studies:  Labs: Labs show the following:   Basic Metabolic Panel: Recent Labs  Lab 03/31/20 0404 03/31/20 0404 04/02/20 1021 04/02/20 1021 04/03/20 0522 04/03/20 0522 04/04/20 0808 04/06/20 0741  NA 132*  --  135  --  134*  --  133* 134*  K 5.2*   < > 4.7   < > 4.3   < > 4.6 5.1  CL 106  --  105  --  107  --  104 105  CO2 18*  --  21*  --  18*  --  19* 21*  GLUCOSE 165*  --  152*  --  149*  --  125* 162*  BUN 101*  --  87*  --  84*  --  80* 64*  CREATININE 7.28*  --  7.12*  --  6.70*  --  5.97* 5.08*  CALCIUM 7.5*  --  7.8*  --  7.5*  --  7.6* 8.1*  PHOS 6.6*  --  6.8*  --  6.0*  --   --  5.1*   < > = values in this interval not displayed.   GFR Estimated Creatinine Clearance: 15.4 mL/min (A) (by C-G formula based on SCr of 5.08 mg/dL (H)). Liver Function Tests: Recent Labs  Lab 03/31/20 0404 04/02/20 1021 04/03/20 0522  ALBUMIN 2.1* 2.5* 2.2*   No results for input(s): LIPASE, AMYLASE in the last 168 hours. No results for input(s): AMMONIA in the last 168 hours. Coagulation profile No results for input(s): INR, PROTIME in the last 168 hours.  CBC: Recent Labs  Lab 04/03/20 0522 04/06/20 0741  WBC 6.2 6.7  HGB 8.2* 8.5*  HCT 24.1* 24.2*  MCV 89.6 86.7  PLT 277 276   Cardiac Enzymes: No results for input(s): CKTOTAL, CKMB, CKMBINDEX, TROPONINI in the last 168 hours. BNP (last 3 results) No results for input(s): PROBNP in the last 8760 hours. CBG: Recent Labs  Lab 04/05/20 1214 04/05/20 1651 04/05/20 2100  04/06/20 9702 04/06/20 6378  GLUCAP 207* 124* 109* 147* 170*   D-Dimer: No results for input(s): DDIMER in the last 72 hours. Hgb A1c: No results for input(s): HGBA1C in the last 72 hours. Lipid Profile: No results for input(s): CHOL, HDL, LDLCALC, TRIG, CHOLHDL, LDLDIRECT in the last 72 hours. Thyroid function studies: No results for input(s): TSH, T4TOTAL, T3FREE, THYROIDAB in the last 72 hours.  Invalid input(s): FREET3 Anemia work up: No results for input(s): VITAMINB12, FOLATE, FERRITIN, TIBC, IRON, RETICCTPCT in the last 72 hours. Sepsis Labs: Recent Labs  Lab 04/03/20 0522 04/06/20 0741  WBC 6.2 6.7    Microbiology No results found for this or any previous visit (from the past 240 hour(s)).  Procedures and diagnostic studies:  No results found.             LOS: 4 days   Gregory Moody  [facility]     04/06/2020, 10:33 AM

## 2020-04-06 NOTE — Progress Notes (Signed)
Gregory Moody  MRN: [id number]  DOB/AGE: 10-27-1975 44 y.o.  Primary [place] date: 04/02/2020  Chief Complaint: No chief complaint on file.   S-Pt presented on  04/02/2020 with No chief complaint on file. .    Pt today feels better  Medications . aspirin EC  81 mg Oral Daily  . benztropine  0.5 mg Oral BID  . Chlorhexidine Gluconate Cloth  6 each Topical Daily  . colesevelam  1,875 mg Oral BID WC  . DULoxetine  30 mg Oral Daily  . feeding supplement (NEPRO CARB STEADY)  237 mL Oral BID BM  . haloperidol  0.5 mg Oral BID  . heparin  5,000 Units Subcutaneous Q8H  . insulin aspart  0-5 Units Subcutaneous QHS  . insulin aspart  0-6 Units Subcutaneous TID WC  . insulin aspart  2 Units Subcutaneous TID WC  . multivitamin with minerals  1 tablet Oral Daily  . pantoprazole  40 mg Oral Daily  . sodium bicarbonate  1,300 mg Oral BID         [id number] from the symptoms mentioned above,there are no other symptoms referable to all systems reviewed.  Physical Exam: Vital signs in last 24 hours: Temp:  [97.8 F (36.6 C)] 97.8 F (36.6 C) (06/27 0434) Pulse Rate:  [75-79] 75 (06/27 0434) Resp:  [14-16] 14 (06/27 0434) BP: (139-151)/(90-95) 139/90 (06/27 0434) SpO2:  [100 %] 100 % (06/27 0434) Weight change:  Last BM Date: 04/05/20  Intake/Output from previous day: 06/26 0701 - 06/27 0700 In: 720 [P.O.:720] Out: 2400 [Urine:2400] Total I/O In: -  Out: 2200 [Urine:2200]   Physical Exam: General- pt is awake,alert, oriented to time place and person Resp- No acute REsp distress, CTA B/L NO Rhonchi CVS- S1S2 regular in rate and rhythm GIT- BS+, soft, NT, ND EXT- NO LE Edema, Cyanosis   Lab Results: CBC Recent Labs    04/06/20 0741  WBC 6.7  HGB 8.5*  HCT 24.2*  PLT 276    BMET Recent Labs    04/04/20 0808 04/06/20 0741  NA 133* 134*  K 4.6 5.1  CL 104 105  CO2 19* 21*  GLUCOSE 125* 162*  BUN 80* 64*   CREATININE 5.97* 5.08*  CALCIUM 7.6* 8.1*    Creatinine trend 2021 12.9==>5.9==>5.0 1.0--3.1-AKI in March admission 2020 1.6--7.75-AKI  MICRO No results found for this or any previous visit (from the past 240 hour(s)).    Lab Results  Component Value Date   PTH 23 04/17/2017   PTH Comment 04/17/2017   CALCIUM 8.1 (L) 04/06/2020   PHOS 5.1 (H) 04/06/2020               Impression:  Patient is a 44 year old male with past medical history of diabetes mellitus type 1, gastroparesis, peripheral neuropathy, history of DKA, history of esophageal candidiasis, history of chronic diarrhea, history of QuantiFERON being positive in 2017 who was admitted to the hospital with chief complaint of DKA, acute kidney injury and hypotension  1)Renal  AKI secondary to obstructive uropathy                AKI on CKD               CKD stage 2/3.               CKD since 2020               CKD secondary to diabetes mellitus/obstructive uropathy  Progression of CKD marked with multiple episodes of AKI in 2020 and now in 2021                              AKI now better                 Creatinine trending down   2)HTN Blood pressure stable Patient is not on any antihypertensives  3)Anemia of chronic disease  HGb at goal (9--11)   4) secondary hyperparathyroidism -CKD Mineral-Bone Disorder   Secondary Hyperparathyroidism present .  Phosphorus is not at goal. 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 bicarb    Plan:   We will continue current treatment plan    Angy Swearengin s [facility] 04/06/2020, 6:16 PM

## 2020-04-06 NOTE — TOC Transition Note (Signed)
Transition of Care [facility]) - CM/SW Discharge Note   Patient Details  Name: Gregory Moody MRN: [id number] Date of Birth: 01/21/1976  [facility] CM/SW Contact:  Meriel Flavors, LCSW Phone Number: 04/06/2020, 2:52 PM   Clinical Narrative:    [facility] consult was requested for SNF placement. TOC requested a eval from PT and OT stating that is recommended so we can start the process. I explained that due to age and insurance it will likely be difficult to find placement         Patient Goals and CMS Choice        Discharge Placement                       Discharge Plan and Services                                     Social Determinants of Health (SDOH) Interventions     Readmission Risk Interventions Readmission Risk Prevention Plan 03/31/2020 02/29/2020 04/19/2019  Transportation Screening Complete Complete Complete  PCP or Specialist Appt within 3-5 Days - - -  Social Work Consult for [place] - - -  Medication Review Press photographer) Complete Complete Complete  PCP or Specialist appointment within 3-5 days of discharge - Complete Complete  HRI or Home Care Consult Complete Complete Complete  SW Recovery Care/Counseling Consult Complete - Complete  Palliative Care Screening Not Applicable - Not [place] Not Applicable Complete Not Applicable  Some recent data might be hidden

## 2020-04-07 LAB — GLUCOSE, CAPILLARY
Glucose-Capillary: 171 mg/dL — ABNORMAL HIGH (ref 70–99)
Glucose-Capillary: 160 mg/dL — ABNORMAL HIGH (ref 70–99)
Glucose-Capillary: 107 mg/dL — ABNORMAL HIGH (ref 70–99)
Glucose-Capillary: 114 mg/dL — ABNORMAL HIGH (ref 70–99)

## 2020-04-07 MED ORDER — SODIUM BICARBONATE-DEXTROSE 150-5 MEQ/L-% IV SOLN
150.0000 meq | INTRAVENOUS | Status: DC
  Administered 2020-04-07 – 2020-04-08 (×2): 150 meq via INTRAVENOUS
  Filled 2020-04-07 (×3): qty 1000

## 2020-04-07 NOTE — Progress Notes (Signed)
[facility]  ROUNDING NOTE   Subjective:   No complaints. Laying in bed. Watching television.   Objective:  Vital signs in last 24 hours:  Temp:  [97.5 F (36.4 C)-98.8 F (37.1 C)] 98.4 F (36.9 C) (06/28 1152) Pulse Rate:  [76-83] 78 (06/28 1152) Resp:  [14-16] 14 (06/28 1152) BP: (116-147)/(88-98) 116/88 (06/28 1152) SpO2:  [100 %] 100 % (06/28 1152)  Weight change:  Filed Weights   04/04/20 2200  Weight: 58.5 kg    Intake/Output: I/O last 3 completed shifts: In: 15 [P.O.:960] Out: 4500 [Urine:4500]   Intake/Output this shift:  Total I/O In: 960 [P.O.:960] Out: -   Physical Exam: General: NAD,   Head: Normocephalic, atraumatic. Moist oral mucosal membranes  Eyes: Anicteric, PERRL  Neck: Supple, trachea midline  Lungs:  Clear to auscultation  Heart: Regular rate and rhythm  Abdomen:  Soft, nontender,   Extremities:  no peripheral edema.  Neurologic: Nonfocal, moving all four extremities  Skin: No lesions        Basic Metabolic Panel: Recent Labs  Lab 04/02/20 1021 04/02/20 1021 04/03/20 0522 04/04/20 0808 04/06/20 0741  NA 135  --  134* 133* 134*  K 4.7  --  4.3 4.6 5.1  CL 105  --  107 104 105  CO2 21*  --  18* 19* 21*  GLUCOSE 152*  --  149* 125* 162*  BUN 87*  --  84* 80* 64*  CREATININE 7.12*  --  6.70* 5.97* 5.08*  CALCIUM 7.8*   < > 7.5* 7.6* 8.1*  PHOS 6.8*  --  6.0*  --  5.1*   < > = values in this interval not displayed.    Liver Function Tests: Recent Labs  Lab 04/02/20 1021 04/03/20 0522  ALBUMIN 2.5* 2.2*   No results for input(s): LIPASE, AMYLASE in the last 168 hours. No results for input(s): AMMONIA in the last 168 hours.  CBC: Recent Labs  Lab 04/03/20 0522 04/06/20 0741  WBC 6.2 6.7  HGB 8.2* 8.5*  HCT 24.1* 24.2*  MCV 89.6 86.7  PLT 277 276    Cardiac Enzymes: No results for input(s): CKTOTAL, CKMB, CKMBINDEX, TROPONINI in the last 168 hours.  BNP: Invalid input(s): POCBNP  CBG: Recent  Labs  Lab 04/06/20 1716 04/06/20 2117 04/07/20 0742 04/07/20 1150 04/07/20 1554  GLUCAP 210* 117* 171* 160* 107*    Microbiology: Results for orders placed or performed during the hospital encounter of 03/24/20  SARS Coronavirus 2 by RT PCR (hospital order, performed in [facility] hospital lab) Nasopharyngeal Nasopharyngeal Swab     Status: None   Collection Time: 03/24/20 12:26 PM   Specimen: Nasopharyngeal Swab  Result Value Ref Range Status   SARS Coronavirus 2 NEGATIVE NEGATIVE Final    Comment: (NOTE) SARS-CoV-2 target nucleic acids are NOT DETECTED.  The SARS-CoV-2 RNA is generally detectable in upper and lower respiratory specimens during the acute phase of infection. The lowest concentration of SARS-CoV-2 viral copies this assay can detect is 250 copies / mL. A negative result does not preclude SARS-CoV-2 infection and should not be used as the sole basis for treatment or other patient management decisions.  A negative result may occur with improper specimen collection / handling, submission of specimen other than nasopharyngeal swab, presence of viral mutation(s) within the areas targeted by this assay, and inadequate number of viral copies (<250 copies / mL). A negative result must be combined with clinical observations, patient history, and epidemiological information.  Fact Sheet for Patients:   StrictlyIdeas.no  Fact Sheet for Healthcare Providers: BankingDealers.co.za  This test is not yet approved or  cleared by the [place] FDA and has been authorized for detection and/or diagnosis of SARS-CoV-2 by FDA under an Emergency Use Authorization (EUA).  This EUA will remain in effect (meaning this test can be used) for the duration of the COVID-19 declaration under Section 564(b)(1) of the Act, 21 U.S.C. section 360bbb-3(b)(1), unless the authorization is terminated or revoked sooner.  Performed at [facility], [address] [place], [place] [address]   MRSA PCR Screening     Status: None   Collection Time: 03/25/20  6:14 AM   Specimen: Nasopharyngeal  Result Value Ref Range Status   MRSA by PCR NEGATIVE NEGATIVE Final    Comment:        The GeneXpert MRSA Assay (FDA approved for NASAL specimens only), is one component of a comprehensive MRSA colonization surveillance program. It is not intended to diagnose MRSA infection nor to guide or monitor treatment for MRSA infections. Performed at [facility], [address] [place], [place] [address]     Coagulation Studies: No results for input(s): LABPROT, INR in the last 72 hours.  Urinalysis: No results for input(s): COLORURINE, LABSPEC, PHURINE, GLUCOSEU, HGBUR, BILIRUBINUR, KETONESUR, PROTEINUR, UROBILINOGEN, NITRITE, LEUKOCYTESUR in the last 72 hours.  Invalid input(s): APPERANCEUR    Imaging: No results found.   Medications:    . aspirin EC  81 mg Oral Daily  . benztropine  0.5 mg Oral BID  . Chlorhexidine Gluconate Cloth  6 each Topical Daily  . colesevelam  1,875 mg Oral BID WC  . DULoxetine  30 mg Oral Daily  . feeding supplement (NEPRO CARB STEADY)  237 mL Oral BID BM  . haloperidol  0.5 mg Oral BID  . heparin  5,000 Units Subcutaneous Q8H  . insulin aspart  0-5 Units Subcutaneous QHS  . insulin aspart  0-6 Units Subcutaneous TID WC  . insulin aspart  2 Units Subcutaneous TID WC  . multivitamin with minerals  1 tablet Oral Daily  . pantoprazole  40 mg Oral Daily  . sodium bicarbonate  1,300 mg Oral BID   acetaminophen **OR** acetaminophen, docusate sodium, loperamide, ondansetron **OR** ondansetron (ZOFRAN) IV  Assessment/ Plan:  [name] is a 43 y.o. [name] with diabetes mellitus type 1, diabetic gastroparesis, diabetic peripheral neuropathy,  history of esophageal candidiasis, history of chronic diarrhea, history of QuantiFERON Gold positive in 2017 who was  admitted to [facility] on 04/02/2020 for Urinary retention [R33.9]  1. Acute renal failure on chronic kidney disease stage IIIA with glycosuria and proteinuria. Baseline creatinine of 1.12 on 03/02/20.  Chronic kidney disease secondary to diabetic nephropathy.   Acute renal failure secondary to obstructive uropathy - foley catheter placed - Start IV fluids: sodium bicarbonate infusion - no indication for dialysis   2. Hypertension: Currently off any blood pressure agents  3. Anemia of chronic kidney disease: hemoglobin 8.5, normocytic.Iron studies on 02/07/20   LOS: 5 Maks Cavallero 6/28/20214:06 PM

## 2020-04-07 NOTE — Evaluation (Signed)
Physical Therapy Evaluation Patient Details Name: Gregory Moody MRN: [id number] DOB: Feb 17, 1976 Today's Date: 04/07/2020   History of Present Illness  Pt is a 44 y.o. male admitted from [place] to main hospital/medical services for urinary retention and AKI. PMH includes DM type I, gastroparesis, several admissions for DKA, chronic malnutrition, history of TB infection, history of covid-19 infection, and depression.   Clinical Impression  Pt received lying in bed upon arrival to room. Maritza, medical interpreter, was present throughout session. Pt ambulated 300 feet using RW with CGA for safety. Pt became slightly confused when given directions in hallway turning down wrong hallway despite clarifying that his room was in another location. Pt performed exercises in sitting and supine for promotion of BLE strengthening. At this time, pt presents with decreased strength and balance. Recommend skilled therapy to address aforementioned deficits and HHPT at time of discharge from hospital to maximize functional mobility and independence and return to PLOF. PT services encourage continued mobility efforts including RN led to promote independence and possibly LTC if home is not a safe option.    Follow Up Recommendations Home health PT    Equipment Recommendations  None recommended by PT    Recommendations for Other Services       Precautions / Restrictions Precautions Precautions: Fall Restrictions Weight Bearing Restrictions: No      Mobility  Bed Mobility Overal bed mobility: Modified Independent             General bed mobility comments: Pt able to come from supine to sitting edge of bed with normal speed and without physical assistance.  Transfers Overall transfer level: Needs assistance Equipment used: Rolling walker (2 wheeled) Transfers: Sit to/from Stand Sit to Stand: Min guard         General transfer comment: noted no LOB when standing to  RW  Ambulation/Gait Ambulation/Gait assistance: Min guard Gait Distance (Feet): 300 Feet Assistive device: Rolling walker (2 wheeled) Gait Pattern/deviations: Step-through pattern     General Gait Details: Occassional verbal cues for looking forward vs down at the floor and for safe RW proximity; pt stopped several times to talk with interpreter and pt had bout of confusion when directed where to walk in hallway  Stairs            Wheelchair Mobility    Modified Rankin (Stroke Patients Only)       Balance Overall balance assessment: Mild deficits observed, not formally tested                                           Pertinent Vitals/Pain Pain Assessment: 0-10 Faces Pain Scale: Hurts little more Pain Location: bilateral hands Pain Descriptors / Indicators: Discomfort (stiffness) Pain Intervention(s): Monitored during session;Repositioned    Home Living Family/patient expects to be discharged to:: Private residence Living Arrangements: Children;Parent Available Help at Discharge: Family;Available 24 hours/day Type of Home: Mobile home Home Access: Stairs to enter Entrance Stairs-Rails: Right;Left Entrance Stairs-Number of Steps: 5 Home Layout: One level Home Equipment: Walker - 2 wheels;Cane - quad;Cane - single point      Prior Function Level of Independence: Independent with assistive device(s)         Comments: Pt reports falls this year secondary to weakness and intermittent usage of AD depending on how strong he feels     Hand Dominance        Extremity/Trunk  Assessment   Upper Extremity Assessment Upper Extremity Assessment: Generalized weakness;Overall WFL for tasks assessed (overall 4-/5)    Lower Extremity Assessment Lower Extremity Assessment: Generalized weakness;Overall WFL for tasks assessed (overall 4-/5)       Communication   Communication: Prefers language other than [place];Interpreter utilized  Development worker, community; utilized IT sales professional)  Cognition Arousal/Alertness: Awake/alert Behavior During Therapy: WFL for tasks assessed/performed Overall Cognitive Status: Within Functional Limits for tasks assessed                                        General Comments      Exercises Other Exercises Other Exercises: hand grasping x 7 bilat; BLE therex while seated edge of bed and in supine: seated marches, SLR, heel slides, and hip ab/add x 10 reps bilaterally   Assessment/Plan    PT Assessment Patient needs continued PT services  PT Problem List Decreased strength;Decreased range of motion;Pain;Decreased balance;Decreased mobility       PT Treatment Interventions DME instruction;Gait training;Stair training;Functional mobility training;Therapeutic activities;Therapeutic exercise;Balance training;Patient/family education    PT Goals (Current goals can be found in the Care Plan section)  Acute Rehab PT Goals Patient Stated Goal: to get stronger and back to doing the exercise that he was doing before PT Goal Formulation: With patient Time For Goal Achievement: 04/21/20 Potential to Achieve Goals: Fair    Frequency Min 2X/week   Barriers to discharge Other (comment) decreased appetite to eat causing weakness and unbalanced blood sugar levels    Co-evaluation               AM-PAC PT "6 Clicks" Mobility  Outcome Measure Help needed turning from your back to your side while in a flat bed without using bedrails?: None Help needed moving from lying on your back to sitting on the side of a flat bed without using bedrails?: None Help needed moving to and from a bed to a chair (including a wheelchair)?: A Little Help needed standing up from a chair using your arms (e.g., wheelchair or bedside chair)?: A Little Help needed to walk in hospital room?: A Little Help needed climbing 3-5 steps with a railing? : A Little 6 Click Score: 20    End of Session  Equipment Utilized During Treatment: Gait belt Activity Tolerance: Patient tolerated treatment well Patient left: in bed;with call bell/phone within reach;with bed alarm set;with SCD's reapplied (pt requested return to bed) Nurse Communication: Mobility status PT Visit Diagnosis: Muscle weakness (generalized) (M62.81);History of falling (Z91.81)    Time: 0177-9390 PT Time Calculation (min) (ACUTE ONLY): 51 min   Charges:   PT Evaluation $PT Eval Moderate Complexity: 1 Mod PT Treatments $Gait Training: 8-22 mins $Therapeutic Exercise: 8-22 mins        [name], SPT  Celes Dedic 04/07/2020, 3:12 PM

## 2020-04-07 NOTE — Evaluation (Signed)
Occupational Therapy Evaluation Patient Details Name: Gregory Moody MRN: [id number] DOB: Mar 14, 1976 Today's Date: 04/07/2020    History of Present Illness 44 y.o. 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 difficile colitis, undergoing colonoscopy with negative biopsies, with hospitalization being complicated by acute urinary retention requiring temporary Foley placement, followed by another admission from 02/06/2020 to 02/09/2020 with similar complaints and urinary retention requiring Foley catheter.  Again came with complaints of generalized weakness, lack of appetite and nausea.  Found to be in DKA.  Not using his insulin as directed since prior to discharge.  Also AKI secondary to urinary retention. Pt adm to [facility] from 6/21-6/23 for depression/suicidal ideation. Now adm back to main hospital/medical services d/t urinary retention.   Clinical Impression   Pt was seen for OT evaluation this date. Pt lives with son and parents in [place] with 5 STE. Prior to this admission, pt endorses mostly laying around at home. States he has b/l hand/foot pain and cannot work and sometimes does not feel like getting OOB during the day. However, when he does, he reports Indep with self care and mobility although not mobilizing far. Currently pt demonstrates impairments as described below (See OT problem list) which functionally limit his ability to perform ADL/self-care tasks. Pt currently requires CGA with ADL transfers and fxl mobility with RW d/t decreased standing balance, MIN A with some ADLs d/t decreased grip/pinch strength, and education/cues for safety awareness.  Pt would benefit from skilled OT to address noted impairments and  functional limitations (see below for any additional details) in order to maximize safety and independence while minimizing falls risk and caregiver burden. While pt does need physical rehabilitation on some level to return to PLOF with ADLs and ADL mobility, strongly feel that he benefit from continued [facility] services and supports to ensure carryover. Anticipate maximum benefit could be reached if occupational therapy services continue in conjunction.     Follow Up Recommendations  Other (comment) (continue therapy services in [facility] setting if possible)    Equipment Recommendations  Other (comment) (TBD)    Recommendations for Other Services       Precautions / Restrictions Precautions Precautions: Fall Restrictions Weight Bearing Restrictions: No      Mobility Bed Mobility Overal bed mobility: Modified Independent             General bed mobility comments: Pt able to get himself to EOB w/o assist.  Transfers Overall transfer level: Needs assistance Equipment used: Rolling walker (2 wheeled) Transfers: Sit to/from Stand Sit to Stand: Min guard         General transfer comment: heavy UE use to push to stand.    Balance Overall balance assessment: Mild deficits observed, not formally tested                                         ADL either performed or assessed with clinical judgement   ADL Overall ADL's : Needs assistance/impaired     Grooming: Wash/dry hands;Wash/dry face;Sitting;Set up;Oral care;Minimal assistance Grooming Details (indicate cue type and reason): MIN A for oral care to open toothpaste and toothbrush wrapper as well as assist d/t spillage of oral secretions.  Upper Body Dressing : Set up;Sitting   Lower Body Dressing: Minimal assistance;Sitting/lateral leans Lower Body Dressing Details (indicate cue type and reason): to don socks, pt Indep with this task limited d/t poor grip/pinch strength. Toilet Transfer: Scientist, research (physical sciences) and Hygiene: Minimal assistance       Functional mobility during ADLs: Rolling walker;Cueing for safety;Supervision/safety;Min guard       Vision Patient Visual Report: No change from baseline       Perception     Praxis      Pertinent Vitals/Pain Pain Assessment: Faces Faces Pain Scale: Hurts little more Pain Location: b/l hands and feet Pain Descriptors / Indicators: Sore Pain Intervention(s): Limited activity within patient's tolerance;Monitored during session     Hand Dominance Right   Extremity/Trunk Assessment Upper Extremity Assessment Upper Extremity Assessment: Generalized weakness;RUE deficits/detail;LUE deficits/detail RUE Deficits / Details: difficulty opposing each digit to finger tip, for 2nd/3rd digit, only able to oppose thumb to pad. Pt reports this is d/t pain. All other ROM is functional, but pt is grossly weak. MMT grossly 3+/5. RUE Coordination: decreased fine motor LUE Deficits / Details: ROM WFL, MMT grossly 3+/5 LUE Coordination: decreased fine motor   Lower Extremity Assessment Lower Extremity Assessment: Generalized weakness       Communication Communication Communication: Prefers language other than English (on previous hospitalizations, pt has refused interpreter and has functional level of English. Pt does request interpreter during OT evaluation this date and ipad interpreter utilized: Counce, ID#: [id number])   Cognition Arousal/Alertness: Awake/alert Behavior During Therapy: WFL for tasks assessed/performed;Impulsive Overall Cognitive Status: Within Functional Limits for tasks assessed                                 General Comments: Pt focusing on plans after d/c, requires gentle re-direction to focus on current assessment at hand.   General Comments  req's RW use for fxl mobility to prevent posterior lean. Pt with heavy UE use. No gross LOB.    Exercises Other Exercises Other  Exercises: OT facilitates education re: importance of OOB activity to improve strength/ROM. In addition, pt refers to weak grip multiple times as a barrier to completing ADLs and OT facilitates education re: importance of using hands for ADLs to improve strength/independence.   Shoulder Instructions      Home Living Family/patient expects to be discharged to:: Private residence Living Arrangements: Children;Parent Available Help at Discharge: Family;Available 24 hours/day Type of Home: Mobile home Home Access: Stairs to enter Entrance Stairs-Number of Steps: 5 Entrance Stairs-Rails: Right;Left Home Layout: One level     Bathroom Shower/Tub: Occupational psychologist: Standard     Home Equipment: Environmental consultant - 2 wheels;Cane - quad;Cane - single point   Additional Comments: conflicting information on where he is living with family or on the streets?      Prior Functioning/Environment Level of Independence: Independent with assistive device(s)        Comments: Pt has been in and out of hospital many times in the last few months.  Apparently able to care for himself... when he is motivated to care for himself        OT Problem List: Decreased strength;Decreased range of motion;Decreased activity tolerance;Impaired balance (sitting and/or standing);Decreased coordination;Decreased cognition;Decreased knowledge of use of DME or AE;Impaired UE functional use;Pain      OT Treatment/Interventions: Self-care/ADL training;Therapeutic exercise;Energy conservation;DME and/or AE instruction;Therapeutic activities;Patient/family education;Balance  training    OT Goals(Current goals can be found in the care plan section) Acute Rehab OT Goals Patient Stated Goal: to get stronger and eventually be able to work again OT Goal Formulation: With patient Time For Goal Achievement: 04/21/20 Potential to Achieve Goals: Good  OT Frequency: Min 2X/week   Barriers to D/C:             Co-evaluation              AM-PAC OT "6 Clicks" Daily Activity     Outcome Measure Help from another person eating meals?: None Help from another person taking care of personal grooming?: A Little Help from another person toileting, which includes using toliet, bedpan, or urinal?: A Little Help from another person bathing (including washing, rinsing, drying)?: A Little Help from another person to put on and taking off regular upper body clothing?: A Little Help from another person to put on and taking off regular lower body clothing?: A Little 6 Click Score: 19   End of Session Equipment Utilized During Treatment: Gait belt;Rolling walker Nurse Communication: Mobility status  Activity Tolerance: Patient tolerated treatment well Patient left: in bed;with call bell/phone within reach;with bed alarm set  OT Visit Diagnosis: Unsteadiness on feet (R26.81);Muscle weakness (generalized) (M62.81)                Time: 9357-0177 OT Time Calculation (min): 57 min Charges:  OT General Charges $OT Visit: 1 Visit OT Evaluation $OT Eval Moderate Complexity: 1 Mod OT Treatments $Self Care/Home Management : 23-37 mins $Therapeutic Activity: 8-22 mins  Gerrianne Scale, MS, OTR/L ascom [phone] 04/07/20, 11:39 AM

## 2020-04-07 NOTE — Progress Notes (Signed)
Progress Note    Gregory Moody  TML:[id number] DOB: [birth year]  DOA: 04/02/2020 PCP: [place]      Brief Narrative:    Medical records reviewed and are as summarized below:  Gregory Moody is a 44 y.o. male       Assessment/Plan:   Active Problems:   AKI (acute kidney injury) ([place])   Urinary retention   Type 1 diabetes mellitus with diabetic chronic kidney disease ([place])   Goals of care, counseling/discussion   Palliative care by specialist   Acute urinary retention, recurrent, more likely developed chronic urinary retention: Patient has been evaluated by urologist. Continue indwelling Foley catheter.  Insulin-dependent diabetes mellitus: Continue 2 units of NovoLog with meals.  NovoLog as needed for hyperglycemia.  Acute kidney injury with metabolic acidosis and hyperphosphatemia: Creatinine and phosphorus levels are slowly improving.  Creatinine was 1.1 on February 07, 2020. Monitor BMP closely.  Check BMP tomorrow.  Follow-up with nephrologist.  Depression: Continue Celexa.  Psychiatrist has signed off the case because patient is mentally stable and has no suicidal thoughts.   Multiple decubitus ulcers present on admission (stage II decubitus ulcer on right elbow, healing decubitus ulcer on the left elbow) : Continue local wound care.   Patient is unable to care for himself at home. Follow up with social worker to assist with disposition.  Follow up with palliative care team.  PT and OT evaluation   Nutrition Problem: Severe Malnutrition Etiology: chronic illness (uncontrolled DM)  Signs/Symptoms: severe fat depletion, severe muscle depletion   Body mass index is 19.61 kg/m.  (Underweight): Encouraged adequate oral intake.  Follow-up with dietitian.  Diet Order            Diet Carb Modified Fluid consistency: Thin; Room service appropriate? Yes  Diet effective now                       Medications:   .  aspirin EC  81 mg Oral Daily  . benztropine  0.5 mg Oral BID  . Chlorhexidine Gluconate Cloth  6 each Topical Daily  . colesevelam  1,875 mg Oral BID WC  . DULoxetine  30 mg Oral Daily  . feeding supplement (NEPRO CARB STEADY)  237 mL Oral BID BM  . haloperidol  0.5 mg Oral BID  . heparin  5,000 Units Subcutaneous Q8H  . insulin aspart  0-5 Units Subcutaneous QHS  . insulin aspart  0-6 Units Subcutaneous TID WC  . insulin aspart  2 Units Subcutaneous TID WC  . multivitamin with minerals  1 tablet Oral Daily  . pantoprazole  40 mg Oral Daily  . sodium bicarbonate  1,300 mg Oral BID   Continuous Infusions:   Anti-infectives (From admission, onward)   None             Family Communication/Anticipated D/C date and plan/Code Status   DVT prophylaxis: heparin injection 5,000 Units Start: 04/02/20 2200 SCDs Start: 04/02/20 1636     Code Status: Full Code  Family Communication: Plan discussed with patient Disposition Plan:    Status is: Inpatient  Remains inpatient appropriate because:Unsafe d/c plan   Dispo: The patient is from: Home              Anticipated d/c is to: Home              Anticipated d/c date is: 2 days  Patient currently is medically stable to d/c.                Subjective:   No complaints.  He feels a little better   Objective:    Vitals:   04/06/20 1841 04/06/20 1939 04/07/20 0434 04/07/20 1152  BP: (!) 144/97 (!) 147/94 (!) 140/98 116/88  Pulse: 83 82 76 78  Resp: 16 16 16 14   Temp: 98.8 F (37.1 C) 98.1 F (36.7 C) (!) 97.5 F (36.4 C) 98.4 F (36.9 C)  TempSrc: Oral Oral Oral Oral  SpO2: 100% 100% 100% 100%  Weight:      Height:       No data found.   Intake/Output Summary (Last 24 hours) at 04/07/2020 1415 Last data filed at 04/07/2020 1300 Gross per 24 hour  Intake 1200 ml  Output 3100 ml  Net -1900 ml   Filed Weights   04/04/20 2200  Weight: 58.5 kg    Exam:  GEN: No acute  distress SKIN: stage II decubitus ulcer on right elbow, healing decubitus ulcer on the left elbow EYES: No pallor or icterus ENT: MMM CV: RRR PULM: Clear to auscultation bilaterally ABD: soft, ND, NT, +BS CNS: AAO x 3, non focal EXT: No edema or tenderness GU: Foley catheter draining amber urine PSYCH: Calm and cooperative.   Data Reviewed:   I have personally reviewed following labs and imaging studies:  Labs: Labs show the following:   Basic Metabolic Panel: Recent Labs  Lab 04/02/20 1021 04/02/20 1021 04/03/20 0522 04/03/20 0522 04/04/20 0808 04/06/20 0741  NA 135  --  134*  --  133* 134*  K 4.7   < > 4.3   < > 4.6 5.1  CL 105  --  107  --  104 105  CO2 21*  --  18*  --  19* 21*  GLUCOSE 152*  --  149*  --  125* 162*  BUN 87*  --  84*  --  80* 64*  CREATININE 7.12*  --  6.70*  --  5.97* 5.08*  CALCIUM 7.8*  --  7.5*  --  7.6* 8.1*  PHOS 6.8*  --  6.0*  --   --  5.1*   < > = values in this interval not displayed.   GFR Estimated Creatinine Clearance: 15.4 mL/min (A) (by C-G formula based on SCr of 5.08 mg/dL (H)). Liver Function Tests: Recent Labs  Lab 04/02/20 1021 04/03/20 0522  ALBUMIN 2.5* 2.2*   No results for input(s): LIPASE, AMYLASE in the last 168 hours. No results for input(s): AMMONIA in the last 168 hours. Coagulation profile No results for input(s): INR, PROTIME in the last 168 hours.  CBC: Recent Labs  Lab 04/03/20 0522 04/06/20 0741  WBC 6.2 6.7  HGB 8.2* 8.5*  HCT 24.1* 24.2*  MCV 89.6 86.7  PLT 277 276   Cardiac Enzymes: No results for input(s): CKTOTAL, CKMB, CKMBINDEX, TROPONINI in the last 168 hours. BNP (last 3 results) No results for input(s): PROBNP in the last 8760 hours. CBG: Recent Labs  Lab 04/06/20 1140 04/06/20 1716 04/06/20 2117 04/07/20 0742 04/07/20 1150  GLUCAP 188* 210* 117* 171* 160*   D-Dimer: No results for input(s): DDIMER in the last 72 hours. Hgb A1c: No results for input(s): HGBA1C in the last  72 hours. Lipid Profile: No results for input(s): CHOL, HDL, LDLCALC, TRIG, CHOLHDL, LDLDIRECT in the last 72 hours. Thyroid function studies: No results for input(s): TSH, T4TOTAL, T3FREE, THYROIDAB  in the last 72 hours.  Invalid input(s): FREET3 Anemia work up: No results for input(s): VITAMINB12, FOLATE, FERRITIN, TIBC, IRON, RETICCTPCT in the last 72 hours. Sepsis Labs: Recent Labs  Lab 04/03/20 0522 04/06/20 0741  WBC 6.2 6.7    Microbiology No results found for this or any previous visit (from the past 240 hour(s)).  Procedures and diagnostic studies:  No results found.             LOS: 5 days   Horace Wishon  [facility]     04/07/2020, 2:15 PM

## 2020-04-07 NOTE — TOC Progression Note (Signed)
Transition of Care [facility]) - Progression Note    Patient Details  Name: Gregory Moody MRN: [id number] Date of Birth: 1976-02-01  Transition of Care [facility]) CM/SW Contact  Beverly Sessions, RN Phone Number: 04/07/2020, 4:35 PM  Clinical Narrative:     Per TOC member Received word from financial counselor that patient is ineligible for Medicaid at this time due to citizenship status.       Expected Discharge Plan and Services                                                 Social Determinants of Health (SDOH) Interventions    Readmission Risk Interventions Readmission Risk Prevention Plan 03/31/2020 02/29/2020 04/19/2019  Transportation Screening Complete Complete Complete  PCP or Specialist Appt within 3-5 Days - - -  Social Work Consult for [place] - - -  Medication Review Press photographer) Complete Complete Complete  PCP or Specialist appointment within 3-5 days of discharge - Complete Complete  HRI or Home Care Consult Complete Complete Complete  SW Recovery Care/Counseling Consult Complete - Complete  Palliative Care Screening Not Applicable - Not [place] Not Applicable Complete Not Applicable  Some recent data might be hidden

## 2020-04-08 LAB — RENAL FUNCTION PANEL
Albumin: 2.3 g/dL — ABNORMAL LOW (ref 3.5–5.0)
Anion gap: 7 (ref 5–15)
BUN: 54 mg/dL — ABNORMAL HIGH (ref 6–20)
CO2: 22 mmol/L (ref 22–32)
Calcium: 8.1 mg/dL — ABNORMAL LOW (ref 8.9–10.3)
Chloride: 106 mmol/L (ref 98–111)
Creatinine, Ser: 4.37 mg/dL — ABNORMAL HIGH (ref 0.61–1.24)
GFR calc Af Amer: 18 mL/min — ABNORMAL LOW (ref >60)
GFR calc non Af Amer: 15 mL/min — ABNORMAL LOW (ref >60)
Glucose, Bld: 128 mg/dL — ABNORMAL HIGH (ref 70–99)
Phosphorus: 4.6 mg/dL (ref 2.5–4.6)
Potassium: 5 mmol/L (ref 3.5–5.1)
Sodium: 135 mmol/L (ref 135–145)

## 2020-04-08 LAB — GLUCOSE, CAPILLARY
Glucose-Capillary: 195 mg/dL — ABNORMAL HIGH (ref 70–99)
Glucose-Capillary: 217 mg/dL — ABNORMAL HIGH (ref 70–99)
Glucose-Capillary: 191 mg/dL — ABNORMAL HIGH (ref 70–99)
Glucose-Capillary: 215 mg/dL — ABNORMAL HIGH (ref 70–99)

## 2020-04-08 MED ORDER — ENSURE MAX PROTEIN PO LIQD
11.0000 [oz_av] | Freq: Two times a day (BID) | ORAL | Status: DC
  Filled 2020-04-08: qty 330

## 2020-04-08 NOTE — Progress Notes (Addendum)
Progress Note    Gregory Moody  YIF:[id number] DOB: 01/13/1976  DOA: 04/02/2020 PCP: [place]      Brief Narrative:    Medical records reviewed and are as summarized below:  Chesky Heyer is a 44 y.o. 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  Per psychiatrist, patient cannot be managed in the behavioral unit with a Foley catheter for his urinary retention, so decision was made to transfer to the medical service for further management.  Reportedly, in and out urethral catheterization had been done on the morning of transfer but patient continued to retain urine.  Urologist was consulted and ultimately indwelling Foley catheter was recommended for recurrent urinary retention. He has probably developed chronic urinary retention from diabetic autonomic neuropathy.  Patient also evaluated by the psychiatrist because of depression with prior suicidal thoughts.  Psychiatrist has signed off the case because he is no longer suicidal per psychiatrist.  He was still followed by nephrologist because of acute kidney injury with metabolic acidosis.  Kidney function has slowly improved.  Disposition was an issue because it was felt that patient was unable to care for himself at home which led to recurrent admissions for DKA and acute kidney injury.   Assessment/Plan:   Active Problems:   AKI (acute kidney injury) ([place])   Urinary retention   Type 1 diabetes mellitus with diabetic chronic kidney disease ([place])   Goals of care, counseling/discussion   Palliative  care by specialist   Acute urinary retention, recurrent, more likely developed chronic urinary retention: Patient has been evaluated by urologist. Continue indwelling Foley catheter.  Insulin-dependent diabetes mellitus: Continue 2 units of NovoLog with meals.  NovoLog as needed for hyperglycemia.  Acute kidney injury with metabolic acidosis and hyperphosphatemia: Creatinine is improving and phosphorus level has normalized.  Creatinine was 1.1 on February 07, 2020. Monitor BMP closely. Patient was started IV sodium bicarbonate infusion by nephrologist yesterday.  Follow-up with nephrologist for further recommendations.  Depression: Continue Celexa.  Psychiatrist has signed off the case because patient is mentally stable and has no suicidal thoughts.   Multiple decubitus ulcers present on admission (stage II decubitus ulcer on right elbow, healing decubitus ulcer on the left elbow) : Continue local wound care.   Patient is unable to care for himself at home. Follow up with social worker to assist with disposition.  Follow up with palliative care team.  PT and OT  Nutrition Problem: Severe Malnutrition Etiology: chronic illness (uncontrolled DM)  Signs/Symptoms: severe fat depletion, severe muscle depletion   Body mass index is 19.61 kg/m.  (Underweight): Encouraged adequate oral intake.  Follow-up with dietitian.  Diet Order            Diet Carb Modified Fluid consistency: Thin; Room service appropriate? Yes  Diet effective now                       Medications:    aspirin EC  81 mg Oral Daily   benztropine  0.5 mg Oral BID   Chlorhexidine Gluconate Cloth  6 each Topical Daily   colesevelam  1,875 mg Oral BID WC   DULoxetine  30 mg Oral Daily   haloperidol  0.5 mg Oral BID   heparin  5,000 Units Subcutaneous Q8H   insulin aspart  0-5 Units Subcutaneous QHS   insulin aspart  0-6 Units Subcutaneous TID WC   insulin aspart  2 Units Subcutaneous TID WC    multivitamin with minerals  1 tablet Oral Daily   pantoprazole  40 mg Oral Daily   Ensure Max Protein  11 oz Oral BID   sodium bicarbonate  1,300 mg Oral BID   Continuous Infusions:  sodium bicarbonate 150 mEq in dextrose 5% 1000 mL Stopped (04/07/20 1707)     Anti-infectives (From admission, onward)   None             Family Communication/Anticipated D/C date and plan/Code Status   DVT prophylaxis: heparin injection 5,000 Units Start: 04/02/20 2200 SCDs Start: 04/02/20 1636     Code Status: Full Code  Family Communication: Plan discussed with patient Disposition Plan:    Status is: Inpatient  Remains inpatient appropriate because:Unsafe d/c plan   Dispo: The patient is from: Home              Anticipated d/c is to: Home              Anticipated d/c date is: 2 days              Patient currently is medically stable to d/c.                Subjective:   No acute overnight events.  He has no complaints.   Objective:    Vitals:   04/07/20 1152 04/07/20 1956 04/08/20 0444 04/08/20 1212  BP: 116/88 123/87 (!) 145/98 (!) 143/83  Pulse: 78 85 75 96  Resp: 14 16  15   Temp: 98.4 F (36.9 C) 97.8 F (36.6 C) 98.3 F (36.8 C) 97.7 F (36.5 C)  TempSrc: Oral Oral Oral Oral  SpO2: 100% 100% 100% 100%  Weight:      Height:       No data found.   Intake/Output Summary (Last 24 hours) at 04/08/2020 1534 Last data filed at 04/08/2020 1300 Gross per 24 hour  Intake 1312.18 ml  Output 1250 ml  Net 62.18 ml   Filed Weights   04/04/20 2200  Weight: 58.5 kg    Exam:  GEN: No acute distress SKIN: stage II decubitus ulcer on right elbow, healing decubitus ulcer on the left elbow EYES: No pallor or icterus ENT: MMM CV: Regular rate and rhythm, no tachycardia  PULM: No wheezing or rales heard ABD: soft, ND, NT, +BS CNS: AAO x 3, non focal EXT: No edema or tenderness GU: Foley catheter draining amber urine PSYCH: Calm and  cooperative.   Data Reviewed:   I have personally reviewed following labs and imaging studies:  Labs: Labs show the following:   Basic Metabolic Panel: Recent Labs  Lab 04/02/20 1021 04/02/20 1021 04/03/20 0522 04/03/20 0522 04/04/20 0808 04/04/20 0808 04/06/20 0741 04/08/20 0502  NA 135  --  134*  --  133*  --  134* 135  K 4.7   < > 4.3   < > 4.6   < > 5.1 5.0  CL 105  --  107  --  104  --  105 106  CO2 21*  --  18*  --  19*  --  21* 22  GLUCOSE 152*  --  149*  --  125*  --  162* 128*  BUN 87*  --  84*  --  80*  --  64* 54*  CREATININE 7.12*  --  6.70*  --  5.97*  --  5.08* 4.37*  CALCIUM 7.8*  --  7.5*  --  7.6*  --  8.1* 8.1*  PHOS 6.8*  --  6.0*  --   --   --  5.1* 4.6   < > = values in this interval not displayed.   GFR Estimated Creatinine Clearance: 17.8 mL/min (A) (by C-G formula based on SCr of 4.37 mg/dL (H)). Liver Function Tests: Recent Labs  Lab 04/02/20 1021 04/03/20 0522 04/08/20 0502  ALBUMIN 2.5* 2.2* 2.3*   No results for input(s): LIPASE, AMYLASE in the last 168 hours. No results for input(s): AMMONIA in the last 168 hours. Coagulation profile No results for input(s): INR, PROTIME in the last 168 hours.  CBC: Recent Labs  Lab 04/03/20 0522 04/06/20 0741  WBC 6.2 6.7  HGB 8.2* 8.5*  HCT 24.1* 24.2*  MCV 89.6 86.7  PLT 277 276   Cardiac Enzymes: No results for input(s): CKTOTAL, CKMB, CKMBINDEX, TROPONINI in the last 168 hours. BNP (last 3 results) No results for input(s): PROBNP in the last 8760 hours. CBG: Recent Labs  Lab 04/07/20 1150 04/07/20 1554 04/07/20 2142 04/08/20 0804 04/08/20 1214  GLUCAP 160* 107* 114* 195* 217*   D-Dimer: No results for input(s): DDIMER in the last 72 hours. Hgb A1c: No results for input(s): HGBA1C in the last 72 hours. Lipid Profile: No results for input(s): CHOL, HDL, LDLCALC, TRIG, CHOLHDL, LDLDIRECT in the last 72 hours. Thyroid function studies: No results for input(s): TSH, T4TOTAL,  T3FREE, THYROIDAB in the last 72 hours.  Invalid input(s): FREET3 Anemia work up: No results for input(s): VITAMINB12, FOLATE, FERRITIN, TIBC, IRON, RETICCTPCT in the last 72 hours. Sepsis Labs: Recent Labs  Lab 04/03/20 0522 04/06/20 0741  WBC 6.2 6.7    Microbiology No results found for this or any previous visit (from the past 240 hour(s)).  Procedures and diagnostic studies:  No results found.             LOS: 6 days   Marchello Rothgeb  [facility]     04/08/2020, 3:34 PM

## 2020-04-08 NOTE — Progress Notes (Signed)
[facility]  ROUNDING NOTE   Subjective:   UOP 1298mL Bicarb gtt  Patient no complaints.   Objective:  Vital signs in last 24 hours:  Temp:  [97.7 F (36.5 C)-98.3 F (36.8 C)] 97.7 F (36.5 C) (06/29 1212) Pulse Rate:  [75-96] 96 (06/29 1212) Resp:  [15-16] 15 (06/29 1212) BP: (123-145)/(83-98) 143/83 (06/29 1212) SpO2:  [100 %] 100 % (06/29 1212)  Weight change:  Filed Weights   04/04/20 2200  Weight: 58.5 kg    Intake/Output: I/O last 3 completed shifts: In: 1792.2 [P.O.:1440; I.V.:352.2] Out: 2150 [Urine:2150]   Intake/Output this shift:  Total I/O In: 480 [P.O.:480] Out: -   Physical Exam: General: NAD,   Head: Normocephalic, atraumatic. Moist oral mucosal membranes  Eyes: Anicteric, PERRL  Neck: Supple, trachea midline  Lungs:  Clear to auscultation  Heart: Regular rate and rhythm  Abdomen:  Soft, nontender,   Extremities:  no peripheral edema.  Neurologic: Nonfocal, moving all four extremities  Skin: No lesions        Basic Metabolic Panel: Recent Labs  Lab 04/02/20 1021 04/02/20 1021 04/03/20 0522 04/03/20 0522 04/04/20 0808 04/06/20 0741 04/08/20 0502  NA 135  --  134*  --  133* 134* 135  K 4.7  --  4.3  --  4.6 5.1 5.0  CL 105  --  107  --  104 105 106  CO2 21*  --  18*  --  19* 21* 22  GLUCOSE 152*  --  149*  --  125* 162* 128*  BUN 87*  --  84*  --  80* 64* 54*  CREATININE 7.12*  --  6.70*  --  5.97* 5.08* 4.37*  CALCIUM 7.8*   < > 7.5*   < > 7.6* 8.1* 8.1*  PHOS 6.8*  --  6.0*  --   --  5.1* 4.6   < > = values in this interval not displayed.    Liver Function Tests: Recent Labs  Lab 04/02/20 1021 04/03/20 0522 04/08/20 0502  ALBUMIN 2.5* 2.2* 2.3*   No results for input(s): LIPASE, AMYLASE in the last 168 hours. No results for input(s): AMMONIA in the last 168 hours.  CBC: Recent Labs  Lab 04/03/20 0522 04/06/20 0741  WBC 6.2 6.7  HGB 8.2* 8.5*  HCT 24.1* 24.2*  MCV 89.6 86.7  PLT 277 276     Cardiac Enzymes: No results for input(s): CKTOTAL, CKMB, CKMBINDEX, TROPONINI in the last 168 hours.  BNP: Invalid input(s): POCBNP  CBG: Recent Labs  Lab 04/07/20 1150 04/07/20 1554 04/07/20 2142 04/08/20 0804 04/08/20 1214  GLUCAP 160* 107* 114* 195* 217*    Microbiology: Results for orders placed or performed during the hospital encounter of 03/24/20  SARS Coronavirus 2 by RT PCR (hospital order, performed in [facility] hospital lab) Nasopharyngeal Nasopharyngeal Swab     Status: None   Collection Time: 03/24/20 12:26 PM   Specimen: Nasopharyngeal Swab  Result Value Ref Range Status   SARS Coronavirus 2 NEGATIVE NEGATIVE Final    Comment: (NOTE) SARS-CoV-2 target nucleic acids are NOT DETECTED.  The SARS-CoV-2 RNA is generally detectable in upper and lower respiratory specimens during the acute phase of infection. The lowest concentration of SARS-CoV-2 viral copies this assay can detect is 250 copies / mL. A negative result does not preclude SARS-CoV-2 infection and should not be used as the sole basis for treatment or other patient management decisions.  A negative result may occur with improper specimen  collection / handling, submission of specimen other than nasopharyngeal swab, presence of viral mutation(s) within the areas targeted by this assay, and inadequate number of viral copies (<250 copies / mL). A negative result must be combined with clinical observations, patient history, and epidemiological information.  Fact Sheet for Patients:   StrictlyIdeas.no  Fact Sheet for Healthcare Providers: BankingDealers.co.za  This test is not yet approved or  cleared by the [place] FDA and has been authorized for detection and/or diagnosis of SARS-CoV-2 by FDA under an Emergency Use Authorization (EUA).  This EUA will remain in effect (meaning this test can be used) for the duration of the COVID-19 declaration  under Section 564(b)(1) of the Act, 21 U.S.C. section 360bbb-3(b)(1), unless the authorization is terminated or revoked sooner.  Performed at [facility], [address] [place], [place] [address]   MRSA PCR Screening     Status: None   Collection Time: 03/25/20  6:14 AM   Specimen: Nasopharyngeal  Result Value Ref Range Status   MRSA by PCR NEGATIVE NEGATIVE Final    Comment:        The GeneXpert MRSA Assay (FDA approved for NASAL specimens only), is one component of a comprehensive MRSA colonization surveillance program. It is not intended to diagnose MRSA infection nor to guide or monitor treatment for MRSA infections. Performed at [facility], [address] [place], [place] [address]     Coagulation Studies: No results for input(s): LABPROT, INR in the last 72 hours.  Urinalysis: No results for input(s): COLORURINE, LABSPEC, PHURINE, GLUCOSEU, HGBUR, BILIRUBINUR, KETONESUR, PROTEINUR, UROBILINOGEN, NITRITE, LEUKOCYTESUR in the last 72 hours.  Invalid input(s): APPERANCEUR    Imaging: No results found.   Medications:   . sodium bicarbonate 150 mEq in dextrose 5% 1000 mL Stopped (04/07/20 1707)   . aspirin EC  81 mg Oral Daily  . benztropine  0.5 mg Oral BID  . Chlorhexidine Gluconate Cloth  6 each Topical Daily  . colesevelam  1,875 mg Oral BID WC  . DULoxetine  30 mg Oral Daily  . haloperidol  0.5 mg Oral BID  . heparin  5,000 Units Subcutaneous Q8H  . insulin aspart  0-5 Units Subcutaneous QHS  . insulin aspart  0-6 Units Subcutaneous TID WC  . insulin aspart  2 Units Subcutaneous TID WC  . multivitamin with minerals  1 tablet Oral Daily  . pantoprazole  40 mg Oral Daily  . Ensure Max Protein  11 oz Oral BID  . sodium bicarbonate  1,300 mg Oral BID   acetaminophen **OR** acetaminophen, docusate sodium, loperamide, ondansetron **OR** ondansetron (ZOFRAN) IV  Assessment/ Plan:  [name] is a 44 y.o. Hispanic  male with diabetes mellitus type 1, diabetic gastroparesis, diabetic peripheral neuropathy,  history of esophageal candidiasis, history of chronic diarrhea, history of QuantiFERON Gold positive in 2017 who was admitted to [facility] on 04/02/2020 for Urinary retention [R33.9]  1. Acute renal failure on chronic kidney disease stage IIIA with glycosuria and proteinuria. Baseline creatinine of 1.12 on 03/02/20.  Chronic kidney disease secondary to diabetic nephropathy.   Acute renal failure secondary to obstructive uropathy - foley catheter placed - Continue IV fluids: sodium bicarbonate infusion - no indication for dialysis   2. Hypertension: Currently off any blood pressure agents  3. Anemia of chronic kidney disease: hemoglobin 8.5, normocytic.Iron studies on 02/07/20   LOS: 6 Addalynn Kumari 6/29/20214:13 PM

## 2020-04-08 NOTE — Progress Notes (Signed)
Nutrition Follow Up Note   DOCUMENTATION CODES:   Severe malnutrition in context of chronic illness  INTERVENTION:   Ensure Max protein supplement BID, each supplement provides 150kcal and 30g of protein.  MVI daily   NUTRITION DIAGNOSIS:   Severe Malnutrition related to chronic illness (uncontrolled DM) as evidenced by severe fat depletion, severe muscle depletion.  GOAL:   Patient will meet greater than or equal to 90% of their needs  -progressing   MONITOR:   PO intake, Supplement acceptance, Labs, Weight trends, Skin, I & O's  ASSESSMENT:   44 y.o. male significant for type 1 diabetes mellitus, gastroparesis, TB infection, COVID-19 infection, recurrent admissions for DKA intermittent urinary retention, depression, acute kidney injury, recent discharge from the hospital on 03/31/2020 after hospitalization for DKA, was referred from the behavioral unit for admission to the medical service because of urinary retention.  RD working remotely.  Pt continues to have good appetite and oral intake; pt eating 100% of meals. Pt refusing Nepro supplements, will change back to Ensure Max now that Phosphorus is back wnl. No new weight since 6/25; will request weekly weights.   Medications reviewed and include: aspirin, heparin, insulin, MVI, protonix   Labs reviewed: Na 135 wnl, K 5.0 wnl, BUN 54(H), creat 4.37(H), P 4.6 wnl Hgb 8.5(L), Hct 24.2(L) cbgs- 195, 217 x 24 hrs  Diet Order:   Diet Order            Diet Carb Modified Fluid consistency: Thin; Room service appropriate? Yes  Diet effective now                EDUCATION NEEDS:   No education needs have been identified at this time  Skin:  Skin Assessment: Reviewed RN Assessment (ecchymosis)  Last BM:  6/28- type 6  Height:   Ht Readings from Last 1 Encounters:  04/04/20 5\' 8"  (1.727 m)    Weight:   Wt Readings from Last 1 Encounters:  04/04/20 58.5 kg    Ideal Body Weight:  70 kg  BMI:  Body mass index  is 19.61 kg/m.  Estimated Nutritional Needs:   Kcal:  1800-2100kcal/day  Protein:  90-105g/day  Fluid:  1.7-1.9L/day  Koleen Distance MS, RD, LDN Please refer to [facility] for RD and/or RD on-call/weekend/after hours pager

## 2020-04-09 DIAGNOSIS — Z7189 Other specified counseling: Secondary | ICD-10-CM

## 2020-04-09 DIAGNOSIS — N179 Acute kidney failure, unspecified: Secondary | ICD-10-CM

## 2020-04-09 DIAGNOSIS — Z515 Encounter for palliative care: Secondary | ICD-10-CM

## 2020-04-09 DIAGNOSIS — R339 Retention of urine, unspecified: Secondary | ICD-10-CM

## 2020-04-09 LAB — BASIC METABOLIC PANEL
Anion gap: 7 (ref 5–15)
BUN: 50 mg/dL — ABNORMAL HIGH (ref 6–20)
CO2: 25 mmol/L (ref 22–32)
Calcium: 8.1 mg/dL — ABNORMAL LOW (ref 8.9–10.3)
Chloride: 102 mmol/L (ref 98–111)
Creatinine, Ser: 4.01 mg/dL — ABNORMAL HIGH (ref 0.61–1.24)
GFR calc Af Amer: 20 mL/min — ABNORMAL LOW (ref >60)
GFR calc non Af Amer: 17 mL/min — ABNORMAL LOW (ref >60)
Glucose, Bld: 172 mg/dL — ABNORMAL HIGH (ref 70–99)
Potassium: 4.8 mmol/L (ref 3.5–5.1)
Sodium: 134 mmol/L — ABNORMAL LOW (ref 135–145)

## 2020-04-09 LAB — GLUCOSE, CAPILLARY
Glucose-Capillary: 225 mg/dL — ABNORMAL HIGH (ref 70–99)
Glucose-Capillary: 232 mg/dL — ABNORMAL HIGH (ref 70–99)
Glucose-Capillary: 231 mg/dL — ABNORMAL HIGH (ref 70–99)

## 2020-04-09 MED ORDER — HALOPERIDOL 0.5 MG PO TABS: 0.5000 mg | ORAL_TABLET | Freq: Two times a day (BID) | ORAL | 2 refills | Status: AC

## 2020-04-09 MED ORDER — SODIUM BICARBONATE 650 MG PO TABS: 1300.0000 mg | ORAL_TABLET | Freq: Two times a day (BID) | ORAL | 0 refills | Status: DC

## 2020-04-09 MED ORDER — DULOXETINE HCL 30 MG PO CPEP: 30.0000 mg | ORAL_CAPSULE | Freq: Every day | ORAL | 3 refills | Status: AC

## 2020-04-09 MED ORDER — BENZTROPINE MESYLATE 0.5 MG PO TABS: 0.5000 mg | ORAL_TABLET | Freq: Two times a day (BID) | ORAL | 2 refills | Status: AC

## 2020-04-09 NOTE — Discharge Summary (Signed)
Physician Discharge Summary  Gregory Moody STM:[id number] DOB: 1975/11/23 DOA: 04/02/2020  PCP: [place] date: 04/02/2020 Discharge date: 04/09/2020  Admitted From: Home   Discharge disposition: Home   Recommendations for Outpatient Follow-Up:   . Follow up with your primary care provider in one week.  . Check CBC, BMP, magnesium, phosphate in the next visit. . Follow-up with urology as outpatient in 1 month for catheter change.   . Patient will need to follow-up with community mental health clinic since he has been prescribed new antipsychotic and antidepressant medication. [name]  Discharge Diagnosis:   Active Problems:   AKI (acute kidney injury) ([place])   Urinary retention   Type 1 diabetes mellitus with diabetic chronic kidney disease ([place])   Goals of care, counseling/discussion   Palliative care by specialist   Discharge Condition: Improved.  Diet recommendation:  Carbohydrate-modified.   Wound care: As per wound care  Code status: Full.   History of Present Illness:   Gregory Moody is a 44 y.o. 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  Per psychiatrist, patient cannot be managed in the behavioral unit with a Foley catheter for his urinary retention, so decision was made to transfer to the medical service for further management.  Reportedly, in and out urethral catheterization had been done on the morning of transfer but patient continued to retain urine. Urologist was consulted and ultimately indwelling Foley catheter was recommended  for recurrent urinary retention. He has probably developed chronic urinary retention from diabetic autonomic neuropathy.Patient also evaluated by the psychiatrist because of depression with prior suicidal thoughts.   Hospital Course:   Following conditions were addressed during hospitalization as listed below,  Recurrent urinary retention likely from diabetic neuropathy.  Seen by urology.  Recommend indwelling Foley catheter catheter, plan for change in 1 month.  Patient will need urology appointment prior to discharge.   Insulin-dependent diabetes mellitus:on   on sliding scale insulin, NovoLog with meals at home.  This will be continued on discharge.  Acute kidney injury with metabolic acidosis and hyperphosphatemia: Last known creatinine of 1.1 on April 2021.  Creatinine of 4.0 today.  Was seen by nephrology and thought to have obstructive uropathy.  Continue Foley catheter.  He has been started on sodium bicarbonate as well.  We will continue with the next 5 days on discharge.     Will need her BMP in the next visit.   Depression with suicidal ideation initially.  On Celexa at home.  This has been changed to Cymbalta at this time..  No more suicidal.  Psychiatry has signed off.  Patient will benefit from outpatient community mental health follow-up.   stage II decubitus ulcer on right elbow, healing decubitus ulcer on the left elbow : Continue local wound care.    Severe protein calorie malnutrition present on admission.  Encourage oral nutrition.    Debility, deconditioning.  Seen by physical therapy.    Continue to ambulate at home.  Has been progressing.  Disposition.  At this time, patient is stable for disposition home with his family. Medical Consultants:    Nephrology  Palliative care  Psychiatry  Procedures:    Foley  catheter placement. Subjective:   Today, patient was seen and examined at bedside.  Denies any pain, nausea, fever, chills.  Discharge Exam:   Vitals:    04/08/20 2315 04/09/20 0510  BP: (!) 149/97 130/88  Pulse: 84 80  Resp:  16  Temp:  98 F (36.7 C)  SpO2:  100%   Vitals:   04/08/20 1725 04/08/20 2045 04/08/20 2315 04/09/20 0510  BP:  (!) 151/102 (!) 149/97 130/88  Pulse:  85 84 80  Resp:  16  16  Temp:  98.4 F (36.9 C)  98 F (36.7 C)  TempSrc:  Oral  Oral  SpO2:  100%  100%  Weight: 58.1 kg     Height:       Body mass index is 19.48 kg/m.   General: Alert awake, not in obvious distress, thinly built HENT: pupils equally reacting to light,  No scleral pallor or icterus noted. Oral mucosa is moist.  Chest:  Clear breath sounds.  Diminished breath sounds bilaterally. No crackles or wheezes.  CVS: S1 &S2 heard. No murmur.  Regular rate and rhythm. Abdomen: Soft, nontender, nondistended.  Bowel sounds are heard.  Foley catheter in place.  Extremities: No cyanosis, clubbing or edema.  Peripheral pulses are palpable. Psych: Alert, awake and oriented, normal mood CNS:  No cranial nerve deficits.  Power equal in all extremities.   Skin: Warm and dry.  No rashes noted.  Stage II sacral decubitus ulceration on the right elbow, healing decubitus ulcer.  The results of significant diagnostics from this hospitalization (including imaging, microbiology, ancillary and laboratory) are listed below for reference.     Diagnostic Studies:   No results found.   Labs:   Basic Metabolic Panel: Recent Labs  Lab 04/03/20 0522 04/03/20 0522 04/04/20 7867 04/04/20 5449 04/06/20 0741 04/06/20 0741 04/08/20 0502 04/09/20 0532  NA 134*  --  133*  --  134*  --  135 134*  K 4.3   < > 4.6   < > 5.1   < > 5.0 4.8  CL 107  --  104  --  105  --  106 102  CO2 18*  --  19*  --  21*  --  22 25  GLUCOSE 149*  --  125*  --  162*  --  128* 172*  BUN 84*  --  80*  --  64*  --  54* 50*  CREATININE 6.70*  --  5.97*  --  5.08*  --  4.37* 4.01*  CALCIUM 7.5*  --  7.6*  --  8.1*  --  8.1* 8.1*  PHOS 6.0*  --   --   --  5.1*  --  4.6  --    < >  = values in this interval not displayed.   GFR Estimated Creatinine Clearance: 19.3 mL/min (A) (by C-G formula based on SCr of 4.01 mg/dL (H)). Liver Function Tests: Recent Labs  Lab 04/03/20 0522 04/08/20 0502  ALBUMIN 2.2* 2.3*   No results for input(s): LIPASE, AMYLASE in the last 168 hours. No results for input(s): AMMONIA in the last 168 hours. Coagulation profile No results for input(s): INR, PROTIME in the last 168 hours.  CBC: Recent Labs  Lab 04/03/20 0522 04/06/20 0741  WBC 6.2 6.7  HGB 8.2* 8.5*  HCT 24.1* 24.2*  MCV 89.6 86.7  PLT 277 276   Cardiac Enzymes: No results for input(s): CKTOTAL, CKMB, CKMBINDEX, TROPONINI in the last 168 hours. BNP: Invalid input(s):  POCBNP CBG: Recent Labs  Lab 04/08/20 1214 04/08/20 1655 04/08/20 2042 04/09/20 0810 04/09/20 1202  GLUCAP 217* 191* 215* 225* 232*   D-Dimer No results for input(s): DDIMER in the last 72 hours. Hgb A1c No results for input(s): HGBA1C in the last 72 hours. Lipid Profile No results for input(s): CHOL, HDL, LDLCALC, TRIG, CHOLHDL, LDLDIRECT in the last 72 hours. Thyroid function studies No results for input(s): TSH, T4TOTAL, T3FREE, THYROIDAB in the last 72 hours.  Invalid input(s): FREET3 Anemia work up No results for input(s): VITAMINB12, FOLATE, FERRITIN, TIBC, IRON, RETICCTPCT in the last 72 hours. Microbiology No results found for this or any previous visit (from the past 240 hour(s)).   Discharge Instructions:   Discharge Instructions     Diet - low sodium heart healthy   Complete by: As directed    Discharge instructions   Complete by: As directed    Follow-up with primary care provider in 1 week.  You will need to follow-up with urology as outpatient in 1 week for catheter change.  Continue taking medications as prescribed.  Continue Foley catheter until seen by urologist.  Please follow-up with community mental health clinic since you have been started on new medications.   Check blood work in the next visit (to check your  kidney function)   Discharge wound care:   Complete by: As directed    As per wound care   Increase activity slowly   Complete by: As directed       Allergies as of 04/09/2020   No Known Allergies      Medication List     STOP taking these medications    citalopram 20 MG tablet Commonly known as: CELEXA       TAKE these medications    aspirin EC 81 MG tablet Take 81 mg by mouth daily.   benztropine 0.5 MG tablet Commonly known as: COGENTIN Take 1 tablet (0.5 mg total) by mouth 2 (two) times daily.   colesevelam 625 MG tablet Commonly known as: WELCHOL Take 3 tablets (1,875 mg total) by mouth 2 (two) times daily with a meal.   docusate sodium 100 MG capsule Commonly known as: COLACE Take 1 capsule (100 mg total) by mouth 2 (two) times daily as needed for mild constipation.   DULoxetine 30 MG capsule Commonly known as: CYMBALTA Take 1 capsule (30 mg total) by mouth daily. Start taking on: April 10, 2020   haloperidol 0.5 MG tablet Commonly known as: HALDOL Take 1 tablet (0.5 mg total) by mouth 2 (two) times daily.   insulin aspart 100 UNIT/ML injection Commonly known as: novoLOG Inject 0-5 Units into the skin at bedtime.   insulin aspart 100 UNIT/ML injection Commonly known as: novoLOG Inject 0-9 Units into the skin 3 (three) times daily with meals.   insulin aspart 100 UNIT/ML injection Commonly known as: novoLOG Inject 2 Units into the skin 3 (three) times daily with meals.   loperamide 2 MG capsule Commonly known as: IMODIUM Take 1 capsule (2 mg total) by mouth 2 (two) times daily as needed for diarrhea or loose stools.   pantoprazole 40 MG tablet Commonly known as: PROTONIX Take 1 tablet (40 mg total) by mouth daily.   sodium bicarbonate 650 MG tablet Take 2 tablets (1,300 mg total) by mouth 2 (two) times daily.               Discharge Care Instructions  (From admission, onward)  Start     Ordered   04/09/20 0000  Discharge wound care:       Comments: As per wound care   04/09/20 [place]. Schedule an appointment as soon as possible for a visit in 1 week(s).   Why: regular followup Contact information: [place] [address] [phone]         Billey Co, MD. Schedule an appointment as soon as possible for a visit in 4 week(s).   Specialty: Urology Why: to address foley catheter Contact information: [place] [address] [phone]         community mental health clinic Follow up in 2 week(s).   Why: for mental health followup and adjustment of medications                 Time coordinating discharge: 39 minutes  Signed:  Bryden Darden  [facility] 04/09/2020, 1:26 PM

## 2020-04-09 NOTE — TOC Progression Note (Signed)
Transition of Care [facility]) - Progression Note    Patient Details  Name: Gregory Moody MRN: [id number] Date of Birth: [birth year]  Transition of [facility]) CM/SW Contact  Beverly Sessions, RN Phone Number: 04/09/2020, 10:21 AM  Clinical Narrative:      Patient has a PCP through [facility] [employer], gets his medications from there, previous admissions has been given information on resources in [place], transportation resources, [place], I have him a glucometer kit previous admission.  He was set up with charity home health multiple times through [facility] agencies and did not follow through.  Due to his history of non compliance home health agencies will not accepted him back  - this admission patient was admitted from [facility] with urinary retention, and needing a foley cath.   Patient has been cleared by psych. There was a consult for SNF placement. Patient is ambulating 300 feet with PT.  Financial counseling said that he would not qualify for Medicaid due to his non citizenship status.  I reached out to the MD yesterday to let him know that he would not qualify to go to SNF or home health.   I have updated MD today with the above information, and notified TOC leadership      Expected Discharge Plan and Services                                                 Social Determinants of Health (SDOH) Interventions    Readmission Risk Interventions Readmission Risk Prevention Plan 03/31/2020 02/29/2020 04/19/2019  Transportation Screening Complete Complete Complete  PCP or Specialist Appt within 3-5 Days - - -  Social Work Consult for [place] - - -  Medication Review Press photographer) Complete Complete Complete  PCP or Specialist appointment within 3-5 days of discharge - Complete Complete  HRI or Home Care Consult Complete Complete Complete  SW Recovery Care/Counseling Consult Complete - Complete   Palliative Care Screening Not Applicable - Not [place] Not Applicable Complete Not Applicable  Some recent data might be hidden

## 2020-04-09 NOTE — Progress Notes (Signed)
Occupational Therapy Treatment Patient Details Name: Gregory Moody MRN: [id number] DOB: 1976-06-07 Today's Date: 04/09/2020    History of present illness Pt is a 44 y.o. male admitted from [place] to main hospital/medical services for urinary retention and AKI. PMH includes DM type I, gastroparesis, several admissions for DKA, chronic malnutrition, history of TB infection, history of covid-19 infection, and depression.    OT comments  Pt continues to present with generalized weakness, impaired balance, and decreased motivation that impacts his ability to safely and independently complete functional task.  Session utilized Spanish interpreter via iPad Marinus Maw [phone] which cut off during session, then worked with Lowella Dandy #[id number]).  Pt was received in bathroom upon OTR arrival.  OTR encouraged pt to call RN prior to getting out of bed.  Pt expressed frustration but verbalized understanding.  Pt currently requires min guard assist for optimal safety/balance in functional mobility.   Pt denied any further self-care needs.  OTR provided pt with education re: benefits of engagement in daily routines to promote functional strengthening and mood.  Pt endorsed often not feeling motivated to get out of bed.  OTR provided coaching for pt to identify meaningful occupations and coping skills to improve mood/motivation, including listening to music, being outside, walking, deep breathing, and lifting weights.  Pt will continue to benefit from skilled OT services in acute setting to address functional strengthening, safety, independence, and engagement in meaningful occupations.  Recommend continued mental health therapy services upon discharge.   Follow Up Recommendations  Home health OT;Other (comment) (outpatient mental health services)    Equipment Recommendations  None recommended by OT    Recommendations for Other Services      Precautions / Restrictions Precautions Precautions:  Fall Restrictions Weight Bearing Restrictions: No       Mobility Bed Mobility Overal bed mobility: Modified Independent             General bed mobility comments: Pt able to come from supine to sitting edge of bed with normal speed and without physical assistance.  Transfers Overall transfer level: Needs assistance Equipment used: Rolling walker (2 wheeled) Transfers: Sit to/from Stand Sit to Stand: Min guard              Balance Overall balance assessment: Mild deficits observed, not formally tested                                         ADL either performed or assessed with clinical judgement   ADL Overall ADL's : Needs assistance/impaired     Grooming: Wash/dry hands;Wash/dry face;Sitting;Set up;Oral care;Minimal assistance           Upper Body Dressing : Set up;Sitting   Lower Body Dressing: Minimal assistance;Sitting/lateral leans Lower Body Dressing Details (indicate cue type and reason): to don socks, pt Indep with this task limited d/t poor grip/pinch strength. Toilet Transfer: Designer, fashion/clothing and Hygiene: Minimal assistance       Functional mobility during ADLs: Rolling walker;Cueing for safety;Supervision/safety;Min guard       Vision Patient Visual Report: No change from baseline     Perception     Praxis      Cognition Arousal/Alertness: Awake/alert Behavior During Therapy: WFL for tasks assessed/performed Overall Cognitive Status: Within Functional Limits for tasks assessed  Exercises Other Exercises Other Exercises: provided education re: importance of OOB activity and engagement in daily routines to promote independence, functional strengthening, and mood   Shoulder Instructions       General Comments      Pertinent Vitals/ Pain       Pain Assessment: No/denies pain  Home Living                                           Prior Functioning/Environment              Frequency  Min 2X/week        Progress Toward Goals  OT Goals(current goals can now be found in the care plan section)  Progress towards OT goals: Progressing toward goals  Acute Rehab OT Goals Patient Stated Goal: to go home OT Goal Formulation: With patient Time For Goal Achievement: 04/21/20 Potential to Achieve Goals: Good  Plan Discharge plan remains appropriate;Frequency remains appropriate    Co-evaluation                 AM-PAC OT "6 Clicks" Daily Activity     Outcome Measure   Help from another person eating meals?: None Help from another person taking care of personal grooming?: A Little Help from another person toileting, which includes using toliet, bedpan, or urinal?: A Little Help from another person bathing (including washing, rinsing, drying)?: A Little Help from another person to put on and taking off regular upper body clothing?: A Little Help from another person to put on and taking off regular lower body clothing?: A Little 6 Click Score: 19    End of Session Equipment Utilized During Treatment: Gait belt;Rolling walker  OT Visit Diagnosis: Unsteadiness on feet (R26.81);Muscle weakness (generalized) (M62.81)   Activity Tolerance Patient tolerated treatment well   Patient Left in bed;with call bell/phone within reach;with bed alarm set   Nurse Communication Mobility status        Time: 1610-9604 OT Time Calculation (min): 25 min  Charges: OT General Charges $OT Visit: 1 Visit OT Treatments $Self Care/Home Management : 23-37 mins  Myrtie Hawk Greyson Riccardi, OTR/L 04/09/20, 4:34 PM

## 2020-04-09 NOTE — Progress Notes (Signed)
[facility]  ROUNDING NOTE   Subjective:   UOP 2462mL  Creatinine 4.01 (4.34)  Objective:  Vital signs in last 24 hours:  Temp:  [98 F (36.7 C)-98.4 F (36.9 C)] 98.3 F (36.8 C) (06/30 1345) Pulse Rate:  [80-91] 91 (06/30 1345) Resp:  [16-18] 18 (06/30 1345) BP: (130-151)/(88-102) 148/93 (06/30 1345) SpO2:  [99 %-100 %] 99 % (06/30 1345) Weight:  [58.1 kg] 58.1 kg (06/29 1725)  Weight change:  Filed Weights   04/04/20 2200 04/08/20 1725  Weight: 58.5 kg 58.1 kg    Intake/Output: I/O last 3 completed shifts: In: 1072 [P.O.:720; I.V.:352] Out: 3650 [Urine:3650]   Intake/Output this shift:  Total I/O In: -  Out: 1250 [Urine:1250]  Physical Exam: General: NAD,   Head: Normocephalic, atraumatic. Moist oral mucosal membranes  Eyes: Anicteric, PERRL  Neck: Supple, trachea midline  Lungs:  Clear to auscultation  Heart: Regular rate and rhythm  Abdomen:  Soft, nontender,   Extremities:  no peripheral edema.  Neurologic: Nonfocal, moving all four extremities  Skin: No lesions        Basic Metabolic Panel: Recent Labs  Lab 04/03/20 0522 04/03/20 0522 04/04/20 8786 04/04/20 0808 04/06/20 0741 04/08/20 0502 04/09/20 0532  NA 134*  --  133*  --  134* 135 134*  K 4.3  --  4.6  --  5.1 5.0 4.8  CL 107  --  104  --  105 106 102  CO2 18*  --  19*  --  21* 22 25  GLUCOSE 149*  --  125*  --  162* 128* 172*  BUN 84*  --  80*  --  64* 54* 50*  CREATININE 6.70*  --  5.97*  --  5.08* 4.37* 4.01*  CALCIUM 7.5*   < > 7.6*   < > 8.1* 8.1* 8.1*  PHOS 6.0*  --   --   --  5.1* 4.6  --    < > = values in this interval not displayed.    Liver Function Tests: Recent Labs  Lab 04/03/20 0522 04/08/20 0502  ALBUMIN 2.2* 2.3*   No results for input(s): LIPASE, AMYLASE in the last 168 hours. No results for input(s): AMMONIA in the last 168 hours.  CBC: Recent Labs  Lab 04/03/20 0522 04/06/20 0741  WBC 6.2 6.7  HGB 8.2* 8.5*  HCT 24.1* 24.2*  MCV  89.6 86.7  PLT 277 276    Cardiac Enzymes: No results for input(s): CKTOTAL, CKMB, CKMBINDEX, TROPONINI in the last 168 hours.  BNP: Invalid input(s): POCBNP  CBG: Recent Labs  Lab 04/08/20 1214 04/08/20 1655 04/08/20 2042 04/09/20 0810 04/09/20 1202  GLUCAP 217* 191* 215* 225* 232*    Microbiology: Results for orders placed or performed during the hospital encounter of 03/24/20  SARS Coronavirus 2 by RT PCR (hospital order, performed in [facility] hospital lab) Nasopharyngeal Nasopharyngeal Swab     Status: None   Collection Time: 03/24/20 12:26 PM   Specimen: Nasopharyngeal Swab  Result Value Ref Range Status   SARS Coronavirus 2 NEGATIVE NEGATIVE Final    Comment: (NOTE) SARS-CoV-2 target nucleic acids are NOT DETECTED.  The SARS-CoV-2 RNA is generally detectable in upper and lower respiratory specimens during the acute phase of infection. The lowest concentration of SARS-CoV-2 viral copies this assay can detect is 250 copies / mL. A negative result does not preclude SARS-CoV-2 infection and should not be used as the sole basis for treatment or other patient management decisions.  A negative result may occur with improper specimen collection / handling, submission of specimen other than nasopharyngeal swab, presence of viral mutation(s) within the areas targeted by this assay, and inadequate number of viral copies (<250 copies / mL). A negative result must be combined with clinical observations, patient history, and epidemiological information.  Fact Sheet for Patients:   StrictlyIdeas.no  Fact Sheet for Healthcare Providers: BankingDealers.co.za  This test is not yet approved or  cleared by the [place] FDA and has been authorized for detection and/or diagnosis of SARS-CoV-2 by FDA under an Emergency Use Authorization (EUA).  This EUA will remain in effect (meaning this test can be used) for the duration of  the COVID-19 declaration under Section 564(b)(1) of the Act, 21 U.S.C. section 360bbb-3(b)(1), unless the authorization is terminated or revoked sooner.  Performed at [name], [address] [place], [place] [address]   MRSA PCR Screening     Status: None   Collection Time: 03/25/20  6:14 AM   Specimen: Nasopharyngeal  Result Value Ref Range Status   MRSA by PCR NEGATIVE NEGATIVE Final    Comment:        The GeneXpert MRSA Assay (FDA approved for NASAL specimens only), is one component of a comprehensive MRSA colonization surveillance program. It is not intended to diagnose MRSA infection nor to guide or monitor treatment for MRSA infections. Performed at [facility], [name] [place], [place] [address]     Coagulation Studies: No results for input(s): LABPROT, INR in the last 72 hours.  Urinalysis: No results for input(s): COLORURINE, LABSPEC, PHURINE, GLUCOSEU, HGBUR, BILIRUBINUR, KETONESUR, PROTEINUR, UROBILINOGEN, NITRITE, LEUKOCYTESUR in the last 72 hours.  Invalid input(s): APPERANCEUR    Imaging: No results found.   Medications:   . sodium bicarbonate 150 mEq in dextrose 5% 1000 mL 150 mEq (04/08/20 1925)   . aspirin EC  81 mg Oral Daily  . benztropine  0.5 mg Oral BID  . Chlorhexidine Gluconate Cloth  6 each Topical Daily  . colesevelam  1,875 mg Oral BID WC  . DULoxetine  30 mg Oral Daily  . haloperidol  0.5 mg Oral BID  . heparin  5,000 Units Subcutaneous Q8H  . insulin aspart  0-5 Units Subcutaneous QHS  . insulin aspart  0-6 Units Subcutaneous TID WC  . insulin aspart  2 Units Subcutaneous TID WC  . multivitamin with minerals  1 tablet Oral Daily  . pantoprazole  40 mg Oral Daily  . Ensure Max Protein  11 oz Oral BID  . sodium bicarbonate  1,300 mg Oral BID   acetaminophen **OR** acetaminophen, docusate sodium, loperamide, ondansetron **OR** ondansetron (ZOFRAN) IV  Assessment/ Plan:  [name] is a 44 y.o. [name] with diabetes mellitus type 1, diabetic gastroparesis, diabetic peripheral neuropathy,  history of esophageal candidiasis, history of chronic diarrhea, history of QuantiFERON Gold positive in 2017 who was admitted to [facility] on 04/02/2020 for Urinary retention [R33.9]  1. Acute renal failure on chronic kidney disease stage IIIA with glycosuria and proteinuria. Baseline creatinine of 1.12 on 03/02/20.  Chronic kidney disease secondary to diabetic nephropathy.   Acute renal failure secondary to obstructive uropathy - foley catheter placed - Discontinue IV fluids  - no indication for dialysis   2. Hypertension: Currently off any blood pressure agents  3. Anemia of chronic kidney disease: hemoglobin 8.5, normocytic.Iron studies on 02/07/20  Will need hospital follow up with Nephrology.    LOS: 7 Emad Brechtel  6/30/20212:50 PM

## 2020-04-09 NOTE — TOC Transition Note (Signed)
Transition of Care [facility]) - CM/SW Discharge Note   Patient Details  Name: Gregory Moody MRN: [id number] Date of Birth: August 08, 1976  [facility]) CM/SW Contact:  Beverly Sessions, RN Phone Number: 04/09/2020, 1:35 PM   Clinical Narrative:     Patient to discharge today Patient aware that there is not a skilled need to go to SNF.   Unable to set up charity home health due to patient's history of non compliance. Patient notified.   Patient states that he will be returning home with his mother and father. Confirms that his parents have a working car for transportation  Patient states that he is current with his PCP at [facility].  States that he gets his medication from their pharmacy, and has all of his prescribed medication at home already. Patient confirms his has his glucometer and testing supplies that I provided him on previous admission.  Patient states that he checks his sugars twice a day and administers his own insulin.   Patient states that he has a RW in the home for ambulation.   Bed side RN to educate on foley cath care prior to discharge.  Unit secretary to schedule urology follow up prior to discharge   Final next level of care: Home/Self Care Barriers to Discharge: No Barriers Identified   Patient Goals and CMS Choice        Discharge Placement                       Discharge Plan and Services                                     Social Determinants of Health (SDOH) Interventions     Readmission Risk Interventions Readmission Risk Prevention Plan 04/09/2020 03/31/2020 02/29/2020  Transportation Screening Complete Complete Complete  PCP or Specialist Appt within 3-5 Days - - -  Social Work Consult for [place] - - -  Medication Review Press photographer) Complete Complete Complete  PCP or Specialist appointment within 3-5 days of discharge Patient  refused - Complete  HRI or [place] Not Complete Complete Complete  SW Recovery Care/Counseling Consult - Complete -  Palliative Care Screening Complete Not Applicable -  [place] Not Applicable Not Applicable Complete  Some recent data might be hidden

## 2020-04-16 ENCOUNTER — Encounter: Payer: Self-pay | Admitting: Emergency Medicine

## 2020-04-16 DIAGNOSIS — Z79899 Other long term (current) drug therapy: Secondary | ICD-10-CM | POA: Insufficient documentation

## 2020-04-16 DIAGNOSIS — Z7982 Long term (current) use of aspirin: Secondary | ICD-10-CM | POA: Insufficient documentation

## 2020-04-16 DIAGNOSIS — E162 Hypoglycemia, unspecified: Secondary | ICD-10-CM | POA: Insufficient documentation

## 2020-04-16 DIAGNOSIS — E1165 Type 2 diabetes mellitus with hyperglycemia: Secondary | ICD-10-CM | POA: Insufficient documentation

## 2020-04-16 DIAGNOSIS — R531 Weakness: Secondary | ICD-10-CM | POA: Insufficient documentation

## 2020-04-16 DIAGNOSIS — I10 Essential (primary) hypertension: Secondary | ICD-10-CM | POA: Insufficient documentation

## 2020-04-16 DIAGNOSIS — Z794 Long term (current) use of insulin: Secondary | ICD-10-CM | POA: Insufficient documentation

## 2020-04-16 DIAGNOSIS — F17290 Nicotine dependence, other tobacco product, uncomplicated: Secondary | ICD-10-CM | POA: Insufficient documentation

## 2020-04-16 LAB — CBC WITH DIFFERENTIAL/PLATELET
Abs Immature Granulocytes: 0.01 10*3/uL (ref 0.00–0.07)
Basophils Absolute: 0 10*3/uL (ref 0.0–0.1)
Basophils Relative: 1 %
Eosinophils Absolute: 0.1 10*3/uL (ref 0.0–0.5)
Eosinophils Relative: 1 %
HCT: 23.4 % — ABNORMAL LOW (ref 39.0–52.0)
Hemoglobin: 8 g/dL — ABNORMAL LOW (ref 13.0–17.0)
Immature Granulocytes: 0 %
Lymphocytes Relative: 25 %
Lymphs Abs: 1.2 10*3/uL (ref 0.7–4.0)
MCH: 30.3 pg (ref 26.0–34.0)
MCHC: 34.2 g/dL (ref 30.0–36.0)
MCV: 88.6 fL (ref 80.0–100.0)
Monocytes Absolute: 0.4 10*3/uL (ref 0.1–1.0)
Monocytes Relative: 8 %
Neutro Abs: 3.3 10*3/uL (ref 1.7–7.7)
Neutrophils Relative %: 65 %
Platelets: 377 10*3/uL (ref 150–400)
RBC: 2.64 MIL/uL — ABNORMAL LOW (ref 4.22–5.81)
RDW: 13.5 % (ref 11.5–15.5)
WBC: 5.1 10*3/uL (ref 4.0–10.5)
nRBC: 0 % (ref 0.0–0.2)

## 2020-04-16 LAB — GLUCOSE, CAPILLARY: Glucose-Capillary: 183 mg/dL — ABNORMAL HIGH (ref 70–99)

## 2020-04-16 LAB — COMPREHENSIVE METABOLIC PANEL
ALT: 11 U/L (ref 0–44)
AST: 10 U/L — ABNORMAL LOW (ref 15–41)
Albumin: 2.6 g/dL — ABNORMAL LOW (ref 3.5–5.0)
Alkaline Phosphatase: 49 U/L (ref 38–126)
Anion gap: 8 (ref 5–15)
BUN: 35 mg/dL — ABNORMAL HIGH (ref 6–20)
CO2: 24 mmol/L (ref 22–32)
Calcium: 8.2 mg/dL — ABNORMAL LOW (ref 8.9–10.3)
Chloride: 102 mmol/L (ref 98–111)
Creatinine, Ser: 3.35 mg/dL — ABNORMAL HIGH (ref 0.61–1.24)
GFR calc Af Amer: 24 mL/min — ABNORMAL LOW (ref >60)
GFR calc non Af Amer: 21 mL/min — ABNORMAL LOW (ref >60)
Glucose, Bld: 203 mg/dL — ABNORMAL HIGH (ref 70–99)
Potassium: 4.5 mmol/L (ref 3.5–5.1)
Sodium: 134 mmol/L — ABNORMAL LOW (ref 135–145)
Total Bilirubin: 0.6 mg/dL (ref 0.3–1.2)
Total Protein: 6.3 g/dL — ABNORMAL LOW (ref 6.5–8.1)

## 2020-04-16 LAB — LACTIC ACID, PLASMA: Lactic Acid, Venous: 1 mmol/L (ref 0.5–1.9)

## 2020-04-16 LAB — TROPONIN I (HIGH SENSITIVITY): Troponin I (High Sensitivity): 5 ng/L (ref <18)

## 2020-04-16 NOTE — ED Triage Notes (Signed)
Pt arrived via EMS from home with reports of high blood sugar readings, C-diff, weakness, decreased appetite and low blood pressure. Pt pale in triage with IV in place, 500 ml bolus complete, BP 82/60. Pt reports non-compliance with meds and no family at the home to assist pt. Pt has foley catheter in place as well.

## 2020-04-17 ENCOUNTER — Emergency Department
Admission: EM | Admit: 2020-04-17 | Discharge: 2020-04-17 | Disposition: A | Discharge status: Home / Self Care | Payer: Self-pay | Attending: Student in an Organized Health Care Education/Training Program | Admitting: Student in an Organized Health Care Education/Training Program

## 2020-04-17 ENCOUNTER — Emergency Department: Payer: Self-pay

## 2020-04-17 DIAGNOSIS — R531 Weakness: Secondary | ICD-10-CM

## 2020-04-17 LAB — URINALYSIS, COMPLETE (UACMP) WITH MICROSCOPIC
Bilirubin Urine: NEGATIVE
Glucose, UA: 150 mg/dL — AB
Ketones, ur: NEGATIVE mg/dL
Nitrite: NEGATIVE
Protein, ur: 30 mg/dL — AB
Specific Gravity, Urine: 1.01 (ref 1.005–1.030)
WBC, UA: >50 WBC/hpf — ABNORMAL HIGH (ref 0–5)
pH: 5 (ref 5.0–8.0)

## 2020-04-17 LAB — TROPONIN I (HIGH SENSITIVITY): Troponin I (High Sensitivity): 8 ng/L (ref <18)

## 2020-04-17 IMAGING — DX DG CHEST 1V PORT
1 series · 1 of 1 positions shown · non-contrast
Comparison: [DATE].  [DATE].  CT [DATE].

CLINICAL DATA: Weakness.

EXAM:
PORTABLE CHEST 1 VIEW

[chest ap]
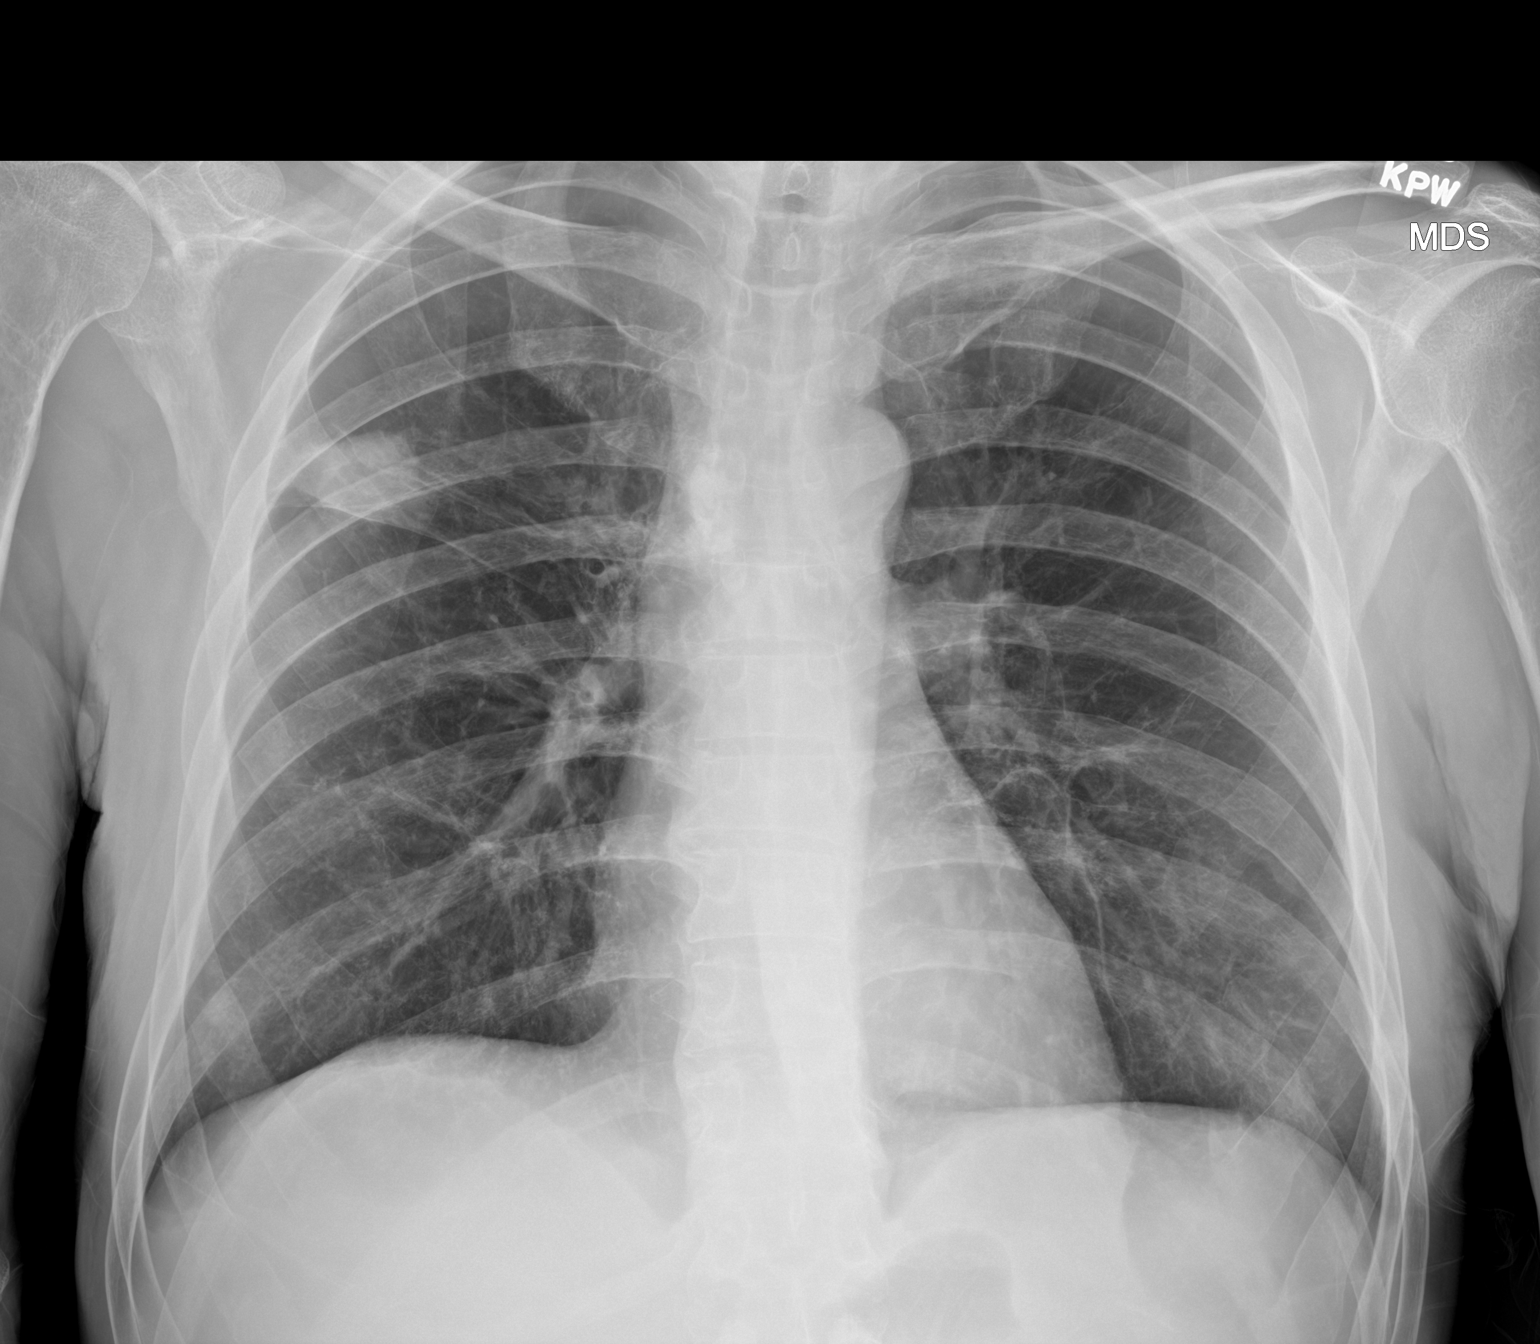

[1 of 1 positions shown; findings below may reference images not displayed]

FINDINGS: Mediastinum and hilar structures normal. Heart size normal.
Persistent density/mass right upper lung. Malignancy cannot be
excluded. Mild bibasilar subsegmental atelectasis and or scarring.
Prominent nipple shadows again noted. No pleural effusion or
pneumothorax.
IMPRESSION: 1. Persistent density/mass right upper lung. Malignancy cannot be
excluded. 2. Mild bibasilar subsegmental axis and or scarring.

## 2020-04-17 MED ORDER — SODIUM CHLORIDE 0.9 % IV BOLUS
500.0000 mL | Freq: Once | INTRAVENOUS | Status: AC
  Administered 2020-04-17: 500 mL via INTRAVENOUS

## 2020-04-17 MED ORDER — INSULIN ASPART 100 UNIT/ML ~~LOC~~ SOLN
2.0000 [IU] | Freq: Once | SUBCUTANEOUS | Status: AC
  Administered 2020-04-17: 2 [IU] via INTRAVENOUS
  Filled 2020-04-17: qty 1

## 2020-04-17 NOTE — ED Notes (Signed)
Signature pad in room not working. Verbalized understanding of discharge instructions and follow-up care.

## 2020-04-17 NOTE — TOC Progression Note (Addendum)
Transition of Care [facility]) - Progression Note    Patient Details  Name: Selby Foisy MRN: [id number] Date of Birth: 22-May-1976  Transition of Care [facility]) CM/SW [place], RN Phone Number: 04/17/2020, 8:59 AM  Clinical Narrative:     TOC aware of consult. Patient confirmed he has previous resources given and no other needs at this time. Confirmed patient is getting medication at medication management clinic. Patient will return home with parents who assist with transportation needs.        Expected Discharge Plan and Services                                                 Social Determinants of Health (SDOH) Interventions    Readmission Risk Interventions Readmission Risk Prevention Plan 04/09/2020 03/31/2020 02/29/2020  Transportation Screening Complete Complete Complete  PCP or Specialist Appt within 3-5 Days - - -  Social Work Consult for [place] - - -  Medication Review Press photographer) Complete Complete Complete  PCP or Specialist appointment within 3-5 days of discharge Patient refused - Complete  HRI or [place] Not Complete Complete Complete  SW Recovery Care/Counseling Consult - Complete -  Palliative Care Screening Complete Not Applicable -  [place] Not Applicable Not Applicable Complete  Some recent data might be hidden

## 2020-04-17 NOTE — ED Provider Notes (Signed)
[facility] Emergency Department Provider Note    None    (approximate)  I have reviewed the triage vital signs and the nursing notes.   HISTORY  Chief Complaint Weakness    HPI Gregory Moody is a 44 y.o. male with the below listed past medical history presents to the ER for evaluation of weakness and high blood sugar.  States he had decreased p.o. intake and appetite for the past several days.  Denies any pain.  Just feels fatigued.  States he was supposed to have home health set up as he does not have much support at home but the home health agency felt that it was too much to handle.  Does have an indwelling Foley that is been present for the past 2 weeks.  Denies any fevers.  Was found to be hypotensive by EMS was given half a liter bolus with improvement in his blood pressure.  He is now requesting something to eat.    Past Medical History:  Diagnosis Date  . COVID-19   . Diabetes mellitus without complication ([place])   . Gastroparesis   . Tuberculosis    Family History  Problem Relation Age of Onset  . Diabetes Mother   . Diabetes Father    Past Surgical History:  Procedure Laterality Date  . COLONOSCOPY N/A 01/25/2020   Procedure: COLONOSCOPY;  Surgeon: Toledo, Benay Pike, MD;  Location: [facility] ENDOSCOPY;  Service: Gastroenterology;  Laterality: N/A;  . ESOPHAGOGASTRODUODENOSCOPY N/A 02/03/2019   Procedure: ESOPHAGOGASTRODUODENOSCOPY (EGD);  Surgeon: Toledo, Benay Pike, MD;  Location: [facility] ENDOSCOPY;  Service: Gastroenterology;  Laterality: N/A;   Patient Active Problem List   Diagnosis Date Noted  . Type 1 diabetes mellitus with diabetic chronic kidney disease ([place])   . Goals of care, counseling/discussion   . Palliative care by specialist   . Recurrent major depression-severe ([place]) 03/31/2020  . Hyperkalemia   . Iron deficiency anemia   . Weight loss   . Chronic diarrhea 02/07/2020  . Abnormal CT of the chest   . Acute urinary  retention 02/06/2020  . Hyperglycemia due to type 2 diabetes mellitus ([place]) 02/06/2020  . History of Clostridium difficile colitis 02/06/2020  . Generalized weakness 02/06/2020  . History of COVID-19 02/06/2020  . Diarrhea 01/21/2020  . Tobacco abuse 01/21/2020  . C. difficile colitis 01/21/2020  . Urinary retention 01/21/2020  . Diabetes mellitus without complication ([place]) 29/51/8841  . Protein-calorie malnutrition, severe 01/02/2020  . Odynophagia   . DKA (diabetic ketoacidoses) ([place]) 12/27/2019  . Depression 12/27/2019  . DKA, type 2 ([place]) 12/27/2019  . Anemia of chronic disease 04/25/2019  . Hypomagnesemia 04/25/2019  . Hypotension 04/23/2019  . CAP (community acquired pneumonia) due to MSSA (methicillin sensitive Staphylococcus aureus) ([place]) 04/14/2019  . COVID-19 virus infection 04/14/2019  . Pressure injury of skin 03/31/2019  . Acute renal failure (ARF) ([place]) 03/06/2019  . CAP (community acquired pneumonia) 02/14/2019  . AKI (acute kidney injury) ([place]) 02/02/2019  . ARF (acute renal failure) ([place]) 01/03/2019  . Malnutrition of moderate degree 02/24/2018  . GERD (gastroesophageal reflux disease) 02/23/2018  . Diabetic gastroparesis ([place]) 02/23/2018  . Diabetic foot ulcer ([place]) 02/23/2018  . Aspiration pneumonia ([place]) 04/21/2017  . Hypoglycemia 04/21/2017  . Diabetes mellitus with hyperglycemia ([place]) 04/21/2017  . Hip fracture, unspecified laterality, closed, initial encounter ([place]) 04/17/2017  . Hyperglycemia 04/17/2017  . Malnutrition ([place]) 04/09/2016  . Cavitary lesion of lung 04/06/2016      Prior to Admission medications  Medication Sig Start Date End Date Taking? Authorizing Provider  aspirin EC 81 MG tablet Take 81 mg by mouth daily.    [provider]  benztropine (COGENTIN) 0.5 MG tablet Take 1 tablet (0.5 mg total) by mouth 2 (two) times daily. 04/09/20   Pokhrel, Corrie Mckusick, MD  colesevelam (WELCHOL) 625 MG tablet Take 3 tablets (1,875 mg total) by  mouth 2 (two) times daily with a meal. 02/09/20   Loletha Grayer, MD  docusate sodium (COLACE) 100 MG capsule Take 1 capsule (100 mg total) by mouth 2 (two) times daily as needed for mild constipation. 03/28/20   Lorella Nimrod, MD  DULoxetine (CYMBALTA) 30 MG capsule Take 1 capsule (30 mg total) by mouth daily. 04/10/20   Pokhrel, Corrie Mckusick, MD  haloperidol (HALDOL) 0.5 MG tablet Take 1 tablet (0.5 mg total) by mouth 2 (two) times daily. 04/09/20   Pokhrel, Corrie Mckusick, MD  insulin aspart (NOVOLOG) 100 UNIT/ML injection Inject 0-5 Units into the skin at bedtime. 03/28/20   Lorella Nimrod, MD  insulin aspart (NOVOLOG) 100 UNIT/ML injection Inject 0-9 Units into the skin 3 (three) times daily with meals. 03/28/20   Lorella Nimrod, MD  insulin aspart (NOVOLOG) 100 UNIT/ML injection Inject 2 Units into the skin 3 (three) times daily with meals. 03/28/20   Lorella Nimrod, MD  loperamide (IMODIUM) 2 MG capsule Take 1 capsule (2 mg total) by mouth 2 (two) times daily as needed for diarrhea or loose stools. 02/09/20   Loletha Grayer, MD  pantoprazole (PROTONIX) 40 MG tablet Take 1 tablet (40 mg total) by mouth daily. 04/03/20   Clapacs, Madie Reno, MD  sodium bicarbonate 650 MG tablet Take 2 tablets (1,300 mg total) by mouth 2 (two) times daily. 04/09/20   Pokhrel, Corrie Mckusick, MD    Allergies Patient has no known allergies.    Social History Social History   Tobacco Use  . Smoking status: Current Some Day Smoker    Types: Cigars  . Smokeless tobacco: Never Used  Vaping Use  . Vaping Use: Never used  Substance Use Topics  . Alcohol use: No  . Drug use: No    Review of Systems Patient denies headaches, rhinorrhea, blurry vision, numbness, shortness of breath, chest pain, edema, cough, abdominal pain, nausea, vomiting, diarrhea, dysuria, fevers, rashes or hallucinations unless otherwise stated above in HPI. ____________________________________________   PHYSICAL EXAM:  VITAL SIGNS: Vitals:   04/16/20 2054 04/17/20  0522  BP: (!) 82/66 (!) 138/96  Pulse: 71 72  Resp: 18 14  Temp: 98.4 F (36.9 C) 98.4 F (36.9 C)  SpO2: 100% 99%    Constitutional: Alert and oriented, cachectic appearing Eyes: Conjunctivae are normal.  Head: Atraumatic. Nose: No congestion/rhinnorhea. Mouth/Throat: Mucous membranes are moist.   Neck: No stridor. Painless ROM.  Cardiovascular: Normal rate, regular rhythm. Grossly normal heart sounds.  Good peripheral circulation. Respiratory: Normal respiratory effort.  No retractions. Lungs CTAB. Gastrointestinal: Soft and nontender. No distention. No abdominal bruits. No CVA tenderness. Genitourinary: foley catheter in place Musculoskeletal: No lower extremity tenderness nor edema.  No joint effusions. Neurologic:  Normal speech and language. No gross focal neurologic deficits are appreciated. No facial droop Skin:  Skin is warm, dry and intact. No rash noted. Psychiatric: Mood and affect are normal. Speech and behavior are normal.  ____________________________________________   LABS (all labs ordered are listed, but only abnormal results are displayed)  Results for orders placed or performed during the hospital encounter of 04/17/20 (from the past 24 hour(s))  Glucose,  capillary     Status: Abnormal   Collection Time: 04/16/20  8:59 PM  Result Value Ref Range   Glucose-Capillary 183 (H) 70 - 99 mg/dL  Lactic acid, plasma     Status: None   Collection Time: 04/16/20  9:11 PM  Result Value Ref Range   Lactic Acid, Venous 1.0 0.5 - 1.9 mmol/L  Comprehensive metabolic panel     Status: Abnormal   Collection Time: 04/16/20  9:11 PM  Result Value Ref Range   Sodium 134 (L) 135 - 145 mmol/L   Potassium 4.5 3.5 - 5.1 mmol/L   Chloride 102 98 - 111 mmol/L   CO2 24 22 - 32 mmol/L   Glucose, Bld 203 (H) 70 - 99 mg/dL   BUN 35 (H) 6 - 20 mg/dL   Creatinine, Ser 3.35 (H) 0.61 - 1.24 mg/dL   Calcium 8.2 (L) 8.9 - 10.3 mg/dL   Total Protein 6.3 (L) 6.5 - 8.1 g/dL   Albumin  2.6 (L) 3.5 - 5.0 g/dL   AST 10 (L) 15 - 41 U/L   ALT 11 0 - 44 U/L   Alkaline Phosphatase 49 38 - 126 U/L   Total Bilirubin 0.6 0.3 - 1.2 mg/dL   GFR calc non Af Amer 21 (L) >60 mL/min   GFR calc Af Amer 24 (L) >60 mL/min   Anion gap 8 5 - 15  CBC with Differential     Status: Abnormal   Collection Time: 04/16/20  9:11 PM  Result Value Ref Range   WBC 5.1 4.0 - 10.5 K/uL   RBC 2.64 (L) 4.22 - 5.81 MIL/uL   Hemoglobin 8.0 (L) 13.0 - 17.0 g/dL   HCT 23.4 (L) 39 - 52 %   MCV 88.6 80.0 - 100.0 fL   MCH 30.3 26.0 - 34.0 pg   MCHC 34.2 30.0 - 36.0 g/dL   RDW 13.5 11.5 - 15.5 %   Platelets 377 150 - 400 K/uL   nRBC 0.0 0.0 - 0.2 %   Neutrophils Relative % 65 %   Neutro Abs 3.3 1.7 - 7.7 K/uL   Lymphocytes Relative 25 %   Lymphs Abs 1.2 0.7 - 4.0 K/uL   Monocytes Relative 8 %   Monocytes Absolute 0.4 0 - 1 K/uL   Eosinophils Relative 1 %   Eosinophils Absolute 0.1 0 - 0 K/uL   Basophils Relative 1 %   Basophils Absolute 0.0 0 - 0 K/uL   Immature Granulocytes 0 %   Abs Immature Granulocytes 0.01 0.00 - 0.07 K/uL  Troponin I (High Sensitivity)     Status: None   Collection Time: 04/16/20  9:11 PM  Result Value Ref Range   Troponin I (High Sensitivity) 5 <18 ng/L  Troponin I (High Sensitivity)     Status: None   Collection Time: 04/17/20  4:16 AM  Result Value Ref Range   Troponin I (High Sensitivity) 8 <18 ng/L  Urinalysis, Complete w Microscopic     Status: Abnormal   Collection Time: 04/17/20  8:25 AM  Result Value Ref Range   Color, Urine YELLOW (A) YELLOW   APPearance CLOUDY (A) CLEAR   Specific Gravity, Urine 1.010 1.005 - 1.030   pH 5.0 5.0 - 8.0   Glucose, UA 150 (A) NEGATIVE mg/dL   Hgb urine dipstick MODERATE (A) NEGATIVE   Bilirubin Urine NEGATIVE NEGATIVE   Ketones, ur NEGATIVE NEGATIVE mg/dL   Protein, ur 30 (A) NEGATIVE mg/dL   Nitrite NEGATIVE NEGATIVE  Leukocytes,Ua LARGE (A) NEGATIVE   RBC / HPF 11-20 0 - 5 RBC/hpf   WBC, UA >50 (H) 0 - 5 WBC/hpf    Bacteria, UA FEW (A) NONE SEEN   Squamous Epithelial / LPF 0-5 0 - 5   WBC Clumps PRESENT    Mucus PRESENT    Hyaline Casts, UA PRESENT    ____________________________________________  EKG My review and personal interpretation at Time: 21:20   Indication: low blood pressure  Rate: 70  Rhythm: sinus Axis: normal Other: normal intervals, no stemi ____________________________________________  RADIOLOGY  I personally reviewed all radiographic images ordered to evaluate for the above acute complaints and reviewed radiology reports and findings.  These findings were personally discussed with the patient.  Please see medical record for radiology report.  ____________________________________________   PROCEDURES  Procedure(s) performed:  Procedures    Critical Care performed: no ____________________________________________   INITIAL IMPRESSION / ASSESSMENT AND PLAN / ED COURSE  Pertinent labs & imaging results that were available during my care of the patient were reviewed by me and considered in my medical decision making (see chart for details).   DDX: AKI, DKA, hyperglycemia, medication noncompliance, sepsis, failure to thrive  Gregory Moody is a 44 y.o. who presents to the ED with symptoms as described above.  Patient's blood pressure improved after IV fluids.  Does not show any signs of sepsis.  No normotensive not febrile.  His abdominal exam is soft benign.  Does have chronic indwelling Foley.  Chest x-ray without any evidence of infiltrate.  Will consult case management.  He is not meeting any criteria for DKA or HHS.  His renal function is improved as compared to previous admission.  He denies any depression or anxiety.  He appears clinically at his baseline.  Clinical Course as of Apr 18 903  Thu Apr 17, 2020  6144 Review of notes there was some concern for possible C. difficile but on further questioning patient states he has chronic diarrhea.  States has not  moved his bowels in several hours.  Is not currently on any antibiotics.  Does not feel that is worse than usual.  Asked him if he can provide a sample here.  States that he cannot.  Have a lower suspicion for C. difficile infectious colitis particularly in absence of fever or leukocytosis.   [PR]  0901 Patient now feels well. Blood pressure has remained stable. No hypoxia. No sirs criteria. Lactate is normal. Urine with many white cells but has chronic indwelling Foley. Will add on culture. He is scheduled for catheter exchange in 2 more weeks which I emphasized the importance of following up. Unfortunately patient has a long history of noncompliance and has been offered extensive resources to him and his family however they are frequently declined. As the patient is stable appearing and at his baseline do not feel that hospitalization clinically indicated.   [PR]    Clinical Course User Index [PR] Merlyn Lot, MD    The patient was evaluated in Emergency Department today for the symptoms described in the history of present illness. He/she was evaluated in the context of the global COVID-19 pandemic, which necessitated consideration that the patient might be at risk for infection with the SARS-CoV-2 virus that causes COVID-19. Institutional protocols and algorithms that pertain to the evaluation of patients at risk for COVID-19 are in a state of rapid change based on information released by regulatory bodies including the CDC and federal and state organizations. These policies and  algorithms were followed during the patient's care in the ED.  As part of my medical decision making, I reviewed the following data within the [facility] notes reviewed and incorporated, Labs reviewed, notes from prior ED visits and [place] Controlled Substance Database   ____________________________________________   FINAL CLINICAL IMPRESSION(S) / ED DIAGNOSES  Final diagnoses:  Weakness       NEW MEDICATIONS STARTED DURING THIS VISIT:  New Prescriptions   No medications on file     Note:  This document was prepared using Dragon voice recognition software and may include unintentional dictation errors.    Merlyn Lot, MD 04/17/20 [phone]

## 2020-04-17 NOTE — Discharge Instructions (Signed)
Please follow up with PCP and urology for catheter exchange. Return for fevers, pain, weakness or for any additional questions or concerns.

## 2020-04-17 NOTE — ED Notes (Signed)
Patient given sandwich tray and juice

## 2020-04-18 LAB — URINE CULTURE

## 2020-05-02 ENCOUNTER — Inpatient Hospital Stay
Admission: EM | Admit: 2020-05-02 | Discharge: 2020-05-13 | DRG: 073 | Disposition: A | Discharge status: Home / Self Care | Payer: Medicaid Other | Attending: Internal Medicine | Admitting: Internal Medicine

## 2020-05-02 ENCOUNTER — Emergency Department: Payer: Medicaid Other

## 2020-05-02 ENCOUNTER — Other Ambulatory Visit: Payer: Self-pay

## 2020-05-02 DIAGNOSIS — E46 Unspecified protein-calorie malnutrition: Secondary | ICD-10-CM | POA: Diagnosis present

## 2020-05-02 DIAGNOSIS — F329 Major depressive disorder, single episode, unspecified: Secondary | ICD-10-CM

## 2020-05-02 DIAGNOSIS — Z9114 Patient's other noncompliance with medication regimen: Secondary | ICD-10-CM

## 2020-05-02 DIAGNOSIS — Z833 Family history of diabetes mellitus: Secondary | ICD-10-CM

## 2020-05-02 DIAGNOSIS — R627 Adult failure to thrive: Secondary | ICD-10-CM | POA: Diagnosis present

## 2020-05-02 DIAGNOSIS — E1022 Type 1 diabetes mellitus with diabetic chronic kidney disease: Secondary | ICD-10-CM | POA: Diagnosis present

## 2020-05-02 DIAGNOSIS — R739 Hyperglycemia, unspecified: Secondary | ICD-10-CM | POA: Diagnosis present

## 2020-05-02 DIAGNOSIS — I951 Orthostatic hypotension: Secondary | ICD-10-CM | POA: Diagnosis present

## 2020-05-02 DIAGNOSIS — R531 Weakness: Secondary | ICD-10-CM

## 2020-05-02 DIAGNOSIS — E1043 Type 1 diabetes mellitus with diabetic autonomic (poly)neuropathy: Principal | ICD-10-CM | POA: Diagnosis present

## 2020-05-02 DIAGNOSIS — K529 Noninfective gastroenteritis and colitis, unspecified: Secondary | ICD-10-CM | POA: Diagnosis present

## 2020-05-02 DIAGNOSIS — N184 Chronic kidney disease, stage 4 (severe): Secondary | ICD-10-CM | POA: Diagnosis present

## 2020-05-02 DIAGNOSIS — E785 Hyperlipidemia, unspecified: Secondary | ICD-10-CM | POA: Diagnosis present

## 2020-05-02 DIAGNOSIS — F32A Depression, unspecified: Secondary | ICD-10-CM | POA: Diagnosis present

## 2020-05-02 DIAGNOSIS — Z20822 Contact with and (suspected) exposure to covid-19: Secondary | ICD-10-CM | POA: Diagnosis present

## 2020-05-02 DIAGNOSIS — Z79899 Other long term (current) drug therapy: Secondary | ICD-10-CM

## 2020-05-02 DIAGNOSIS — N4821 Abscess of corpus cavernosum and penis: Secondary | ICD-10-CM | POA: Diagnosis present

## 2020-05-02 DIAGNOSIS — Z681 Body mass index (BMI) 19 or less, adult: Secondary | ICD-10-CM

## 2020-05-02 DIAGNOSIS — E1165 Type 2 diabetes mellitus with hyperglycemia: Secondary | ICD-10-CM | POA: Diagnosis present

## 2020-05-02 DIAGNOSIS — E1065 Type 1 diabetes mellitus with hyperglycemia: Secondary | ICD-10-CM | POA: Diagnosis present

## 2020-05-02 DIAGNOSIS — N179 Acute kidney failure, unspecified: Secondary | ICD-10-CM | POA: Diagnosis present

## 2020-05-02 DIAGNOSIS — E43 Unspecified severe protein-calorie malnutrition: Secondary | ICD-10-CM | POA: Diagnosis present

## 2020-05-02 DIAGNOSIS — K3184 Gastroparesis: Secondary | ICD-10-CM | POA: Diagnosis present

## 2020-05-02 DIAGNOSIS — Z7982 Long term (current) use of aspirin: Secondary | ICD-10-CM

## 2020-05-02 DIAGNOSIS — R64 Cachexia: Secondary | ICD-10-CM | POA: Diagnosis present

## 2020-05-02 DIAGNOSIS — N319 Neuromuscular dysfunction of bladder, unspecified: Secondary | ICD-10-CM

## 2020-05-02 DIAGNOSIS — N471 Phimosis: Secondary | ICD-10-CM | POA: Diagnosis present

## 2020-05-02 DIAGNOSIS — D638 Anemia in other chronic diseases classified elsewhere: Secondary | ICD-10-CM | POA: Diagnosis present

## 2020-05-02 DIAGNOSIS — I959 Hypotension, unspecified: Secondary | ICD-10-CM

## 2020-05-02 DIAGNOSIS — R296 Repeated falls: Secondary | ICD-10-CM | POA: Diagnosis present

## 2020-05-02 DIAGNOSIS — Z8615 Personal history of latent tuberculosis infection: Secondary | ICD-10-CM

## 2020-05-02 DIAGNOSIS — F17290 Nicotine dependence, other tobacco product, uncomplicated: Secondary | ICD-10-CM | POA: Diagnosis present

## 2020-05-02 DIAGNOSIS — Z638 Other specified problems related to primary support group: Secondary | ICD-10-CM

## 2020-05-02 DIAGNOSIS — E871 Hypo-osmolality and hyponatremia: Secondary | ICD-10-CM | POA: Diagnosis present

## 2020-05-02 DIAGNOSIS — R339 Retention of urine, unspecified: Secondary | ICD-10-CM | POA: Diagnosis present

## 2020-05-02 DIAGNOSIS — Z794 Long term (current) use of insulin: Secondary | ICD-10-CM

## 2020-05-02 LAB — CBC WITH DIFFERENTIAL/PLATELET
Abs Immature Granulocytes: 0.03 10*3/uL (ref 0.00–0.07)
Basophils Absolute: 0 10*3/uL (ref 0.0–0.1)
Basophils Relative: 0 %
Eosinophils Absolute: 0 10*3/uL (ref 0.0–0.5)
Eosinophils Relative: 0 %
HCT: 25.6 % — ABNORMAL LOW (ref 39.0–52.0)
Hemoglobin: 8.6 g/dL — ABNORMAL LOW (ref 13.0–17.0)
Immature Granulocytes: 0 %
Lymphocytes Relative: 12 %
Lymphs Abs: 1.2 10*3/uL (ref 0.7–4.0)
MCH: 29.7 pg (ref 26.0–34.0)
MCHC: 33.6 g/dL (ref 30.0–36.0)
MCV: 88.3 fL (ref 80.0–100.0)
Monocytes Absolute: 0.4 10*3/uL (ref 0.1–1.0)
Monocytes Relative: 4 %
Neutro Abs: 8.5 10*3/uL — ABNORMAL HIGH (ref 1.7–7.7)
Neutrophils Relative %: 84 %
Platelets: 407 10*3/uL — ABNORMAL HIGH (ref 150–400)
RBC: 2.9 MIL/uL — ABNORMAL LOW (ref 4.22–5.81)
RDW: 13.3 % (ref 11.5–15.5)
WBC: 10.2 10*3/uL (ref 4.0–10.5)
nRBC: 0 % (ref 0.0–0.2)

## 2020-05-02 LAB — URINALYSIS, COMPLETE (UACMP) WITH MICROSCOPIC
Bilirubin Urine: NEGATIVE
Glucose, UA: >=500 mg/dL — AB
Ketones, ur: NEGATIVE mg/dL
Nitrite: NEGATIVE
Protein, ur: NEGATIVE mg/dL
Specific Gravity, Urine: 1.005 (ref 1.005–1.030)
Squamous Epithelial / HPF: NONE SEEN (ref 0–5)
pH: 5 (ref 5.0–8.0)

## 2020-05-02 LAB — COMPREHENSIVE METABOLIC PANEL
ALT: <5 U/L (ref 0–44)
AST: 21 U/L (ref 15–41)
Albumin: 2.8 g/dL — ABNORMAL LOW (ref 3.5–5.0)
Alkaline Phosphatase: 110 U/L (ref 38–126)
Anion gap: 9 (ref 5–15)
BUN: 59 mg/dL — ABNORMAL HIGH (ref 6–20)
CO2: 21 mmol/L — ABNORMAL LOW (ref 22–32)
Calcium: 8.7 mg/dL — ABNORMAL LOW (ref 8.9–10.3)
Chloride: 97 mmol/L — ABNORMAL LOW (ref 98–111)
Creatinine, Ser: 3.51 mg/dL — ABNORMAL HIGH (ref 0.61–1.24)
GFR calc Af Amer: 23 mL/min — ABNORMAL LOW (ref >60)
GFR calc non Af Amer: 20 mL/min — ABNORMAL LOW (ref >60)
Glucose, Bld: 345 mg/dL — ABNORMAL HIGH (ref 70–99)
Potassium: 4.3 mmol/L (ref 3.5–5.1)
Sodium: 127 mmol/L — ABNORMAL LOW (ref 135–145)
Total Bilirubin: 0.8 mg/dL (ref 0.3–1.2)
Total Protein: 7.9 g/dL (ref 6.5–8.1)

## 2020-05-02 LAB — BLOOD GAS, VENOUS
Acid-base deficit: 8.5 mmol/L — ABNORMAL HIGH (ref 0.0–2.0)
Bicarbonate: 17.3 mmol/L — ABNORMAL LOW (ref 20.0–28.0)
O2 Saturation: 78 %
Patient temperature: 37
pCO2, Ven: 36 mmHg — ABNORMAL LOW (ref 44.0–60.0)
pH, Ven: 7.29 (ref 7.250–7.430)
pO2, Ven: 48 mmHg — ABNORMAL HIGH (ref 32.0–45.0)

## 2020-05-02 LAB — TROPONIN I (HIGH SENSITIVITY)
Troponin I (High Sensitivity): 7 ng/L (ref <18)
Troponin I (High Sensitivity): 5 ng/L (ref <18)

## 2020-05-02 LAB — LACTIC ACID, PLASMA: Lactic Acid, Venous: 1.5 mmol/L (ref 0.5–1.9)

## 2020-05-02 LAB — SARS CORONAVIRUS 2 BY RT PCR (HOSPITAL ORDER, PERFORMED IN ~~LOC~~ HOSPITAL LAB): SARS Coronavirus 2: NEGATIVE

## 2020-05-02 LAB — GLUCOSE, CAPILLARY
Glucose-Capillary: 293 mg/dL — ABNORMAL HIGH (ref 70–99)
Glucose-Capillary: 303 mg/dL — ABNORMAL HIGH (ref 70–99)

## 2020-05-02 LAB — TSH: TSH: 0.631 u[IU]/mL (ref 0.350–4.500)

## 2020-05-02 IMAGING — DX DG CHEST 1V PORT
1 series · 1 of 1 positions shown · non-contrast
Comparison: [DATE], CT [DATE]

CLINICAL DATA: Weakness and hypotension

EXAM:
PORTABLE CHEST 1 VIEW

[chest ap]
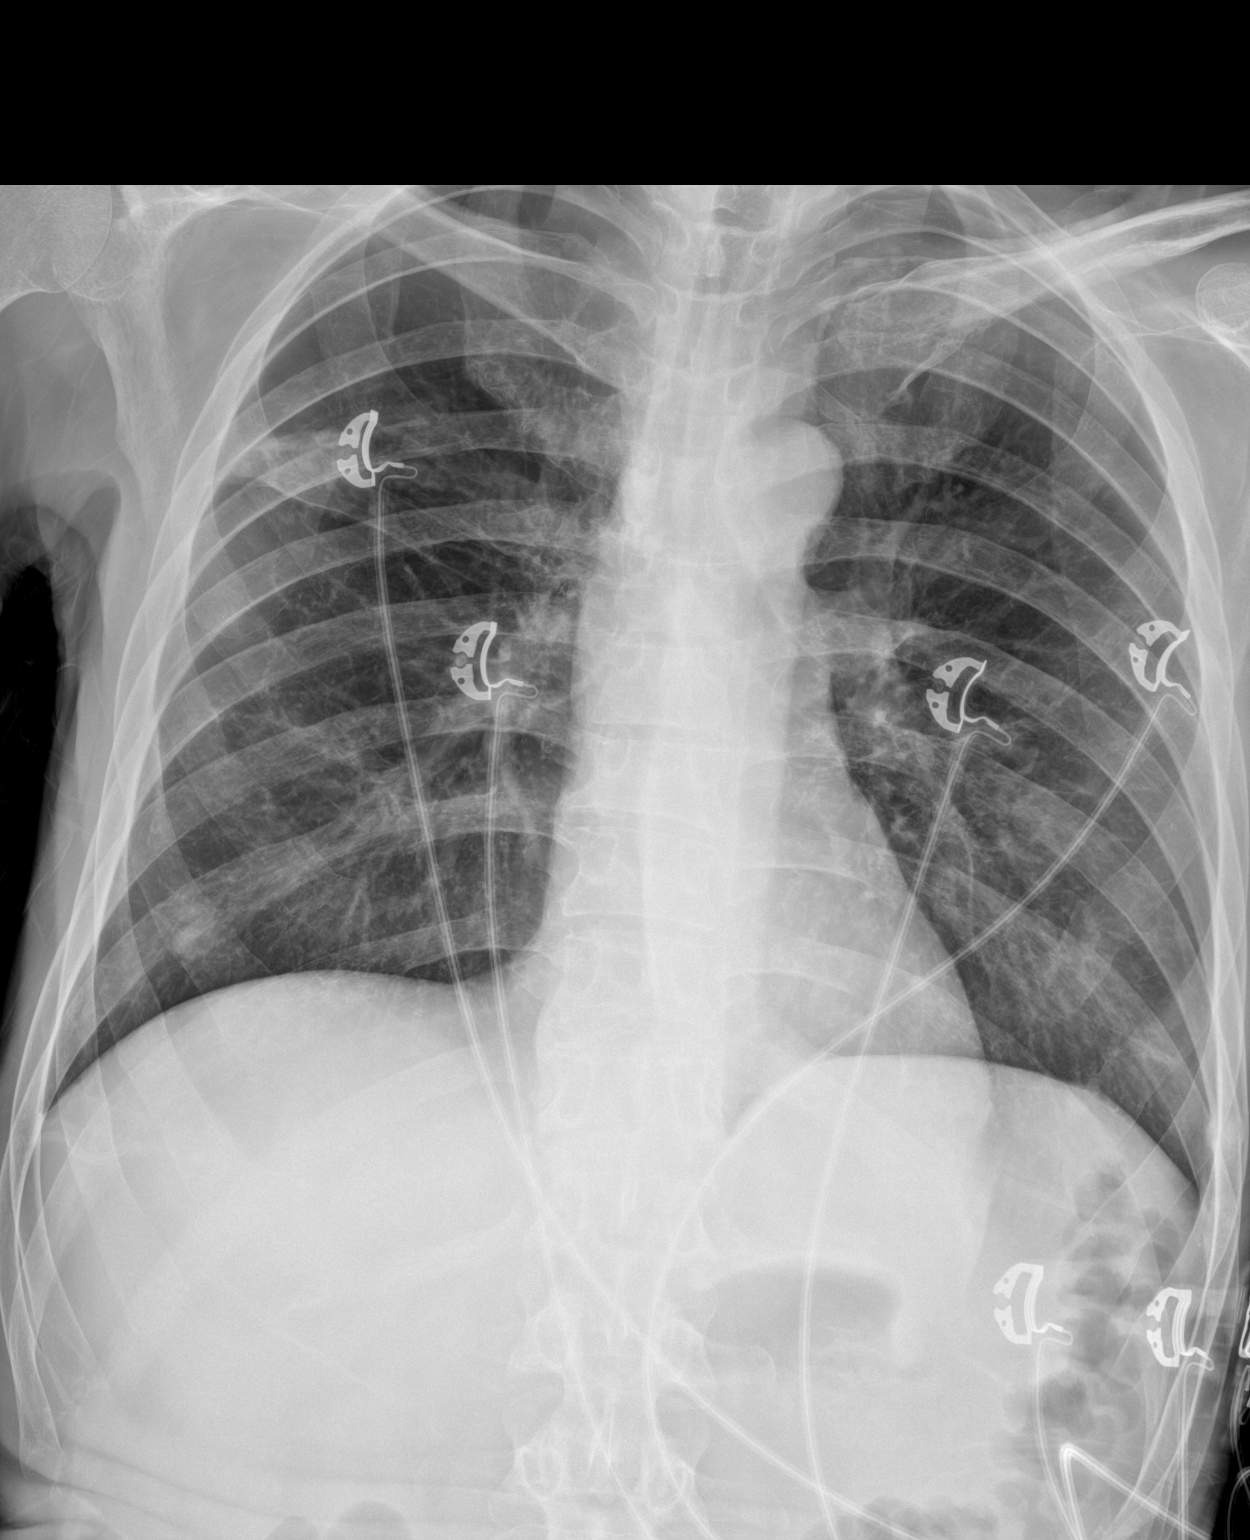

[1 of 1 positions shown; findings below may reference images not displayed]

FINDINGS: A right upper lobe ovoid nodular opacity is mildly decreased
compared to [DATE]. Normal heart size. No pneumothorax.
IMPRESSION: Mildly decreased right upper lobe nodular opacity as compared to
[DATE]. No interval acute airspace disease.

## 2020-05-02 MED ORDER — ACETAMINOPHEN 650 MG RE SUPP: 650.0000 mg | Freq: Four times a day (QID) | RECTAL | Status: DC | PRN

## 2020-05-02 MED ORDER — INSULIN ASPART 100 UNIT/ML ~~LOC~~ SOLN
0.0000 [IU] | Freq: Three times a day (TID) | SUBCUTANEOUS | Status: DC
  Administered 2020-05-03: 5 [IU] via SUBCUTANEOUS
  Administered 2020-05-03 (×2): 3 [IU] via SUBCUTANEOUS
  Administered 2020-05-04: 2 [IU] via SUBCUTANEOUS
  Administered 2020-05-04: 3 [IU] via SUBCUTANEOUS
  Administered 2020-05-04 – 2020-05-06 (×3): 2 [IU] via SUBCUTANEOUS
  Administered 2020-05-07: 1 [IU] via SUBCUTANEOUS
  Administered 2020-05-07 (×2): 2 [IU] via SUBCUTANEOUS
  Administered 2020-05-08: 1 [IU] via SUBCUTANEOUS
  Administered 2020-05-08 (×2): 2 [IU] via SUBCUTANEOUS
  Administered 2020-05-09 (×2): 1 [IU] via SUBCUTANEOUS
  Administered 2020-05-10: 3 [IU] via SUBCUTANEOUS
  Administered 2020-05-10: 1 [IU] via SUBCUTANEOUS
  Administered 2020-05-11: 2 [IU] via SUBCUTANEOUS
  Administered 2020-05-11 – 2020-05-12 (×2): 1 [IU] via SUBCUTANEOUS
  Administered 2020-05-12: 2 [IU] via SUBCUTANEOUS
  Administered 2020-05-13: 1 [IU] via SUBCUTANEOUS
  Filled 2020-05-02 (×24): qty 1

## 2020-05-02 MED ORDER — LOPERAMIDE HCL 2 MG PO CAPS
2.0000 mg | ORAL_CAPSULE | Freq: Two times a day (BID) | ORAL | Status: DC | PRN
  Administered 2020-05-03 – 2020-05-09 (×6): 2 mg via ORAL
  Filled 2020-05-02 (×8): qty 1

## 2020-05-02 MED ORDER — SODIUM CHLORIDE 0.9 % IV BOLUS
1000.0000 mL | Freq: Once | INTRAVENOUS | Status: AC
  Administered 2020-05-02: 1000 mL via INTRAVENOUS

## 2020-05-02 MED ORDER — ACETAMINOPHEN 325 MG PO TABS
650.0000 mg | ORAL_TABLET | Freq: Four times a day (QID) | ORAL | Status: DC | PRN
  Administered 2020-05-10: 650 mg via ORAL
  Filled 2020-05-02: qty 2

## 2020-05-02 MED ORDER — INSULIN ASPART 100 UNIT/ML ~~LOC~~ SOLN: 6.0000 [IU] | Freq: Once | SUBCUTANEOUS | Status: DC

## 2020-05-02 MED ORDER — INSULIN ASPART 100 UNIT/ML ~~LOC~~ SOLN
5.0000 [IU] | Freq: Once | SUBCUTANEOUS | Status: AC
  Administered 2020-05-02: 5 [IU] via SUBCUTANEOUS

## 2020-05-02 MED ORDER — SODIUM CHLORIDE 0.9 % IV BOLUS (SEPSIS)
1000.0000 mL | Freq: Once | INTRAVENOUS | Status: AC
  Administered 2020-05-02: 1000 mL via INTRAVENOUS

## 2020-05-02 MED ORDER — ASPIRIN EC 81 MG PO TBEC
81.0000 mg | DELAYED_RELEASE_TABLET | Freq: Every day | ORAL | Status: DC
  Administered 2020-05-02 – 2020-05-13 (×12): 81 mg via ORAL
  Filled 2020-05-02 (×12): qty 1

## 2020-05-02 MED ORDER — TRAZODONE HCL 50 MG PO TABS: 25.0000 mg | ORAL_TABLET | Freq: Every evening | ORAL | Status: DC | PRN

## 2020-05-02 MED ORDER — INSULIN ASPART 100 UNIT/ML ~~LOC~~ SOLN
5.0000 [IU] | Freq: Once | SUBCUTANEOUS | Status: DC
  Filled 2020-05-02: qty 1

## 2020-05-02 MED ORDER — HALOPERIDOL 0.5 MG PO TABS
0.5000 mg | ORAL_TABLET | Freq: Two times a day (BID) | ORAL | Status: DC
  Administered 2020-05-02 – 2020-05-13 (×22): 0.5 mg via ORAL
  Filled 2020-05-02 (×24): qty 1

## 2020-05-02 MED ORDER — FOLIC ACID 1 MG PO TABS
1.0000 mg | ORAL_TABLET | Freq: Every day | ORAL | Status: DC
  Administered 2020-05-02 – 2020-05-13 (×12): 1 mg via ORAL
  Filled 2020-05-02 (×12): qty 1

## 2020-05-02 MED ORDER — SODIUM CHLORIDE 0.9 % IV SOLN: INTRAVENOUS | Status: DC

## 2020-05-02 MED ORDER — SODIUM CHLORIDE 0.9 % IV SOLN
1.0000 g | Freq: Once | INTRAVENOUS | Status: AC
  Administered 2020-05-02: 1 g via INTRAVENOUS
  Filled 2020-05-02: qty 10

## 2020-05-02 MED ORDER — THIAMINE HCL 100 MG PO TABS
100.0000 mg | ORAL_TABLET | Freq: Every day | ORAL | Status: DC
  Administered 2020-05-02 – 2020-05-13 (×12): 100 mg via ORAL
  Filled 2020-05-02 (×12): qty 1

## 2020-05-02 MED ORDER — OXYCODONE HCL 5 MG PO TABS
5.0000 mg | ORAL_TABLET | ORAL | Status: DC | PRN
  Administered 2020-05-03 – 2020-05-05 (×3): 5 mg via ORAL
  Filled 2020-05-02 (×3): qty 1

## 2020-05-02 MED ORDER — ADULT MULTIVITAMIN W/MINERALS CH
1.0000 | ORAL_TABLET | Freq: Every day | ORAL | Status: DC
  Administered 2020-05-02 – 2020-05-13 (×12): 1 via ORAL
  Filled 2020-05-02 (×12): qty 1

## 2020-05-02 MED ORDER — ALBUTEROL SULFATE (2.5 MG/3ML) 0.083% IN NEBU: 2.5000 mg | INHALATION_SOLUTION | RESPIRATORY_TRACT | Status: DC | PRN

## 2020-05-02 MED ORDER — BENZTROPINE MESYLATE 1 MG PO TABS
0.5000 mg | ORAL_TABLET | Freq: Two times a day (BID) | ORAL | Status: DC
  Administered 2020-05-02 – 2020-05-13 (×22): 0.5 mg via ORAL
  Filled 2020-05-02 (×24): qty 1

## 2020-05-02 MED ORDER — PANTOPRAZOLE SODIUM 40 MG PO TBEC
40.0000 mg | DELAYED_RELEASE_TABLET | Freq: Every day | ORAL | Status: DC
  Administered 2020-05-02 – 2020-05-09 (×8): 40 mg via ORAL
  Filled 2020-05-02 (×8): qty 1

## 2020-05-02 MED ORDER — ONDANSETRON HCL 4 MG PO TABS
4.0000 mg | ORAL_TABLET | Freq: Four times a day (QID) | ORAL | Status: DC | PRN
  Administered 2020-05-05 – 2020-05-08 (×4): 4 mg via ORAL
  Filled 2020-05-02 (×4): qty 1

## 2020-05-02 MED ORDER — DULOXETINE HCL 30 MG PO CPEP
30.0000 mg | ORAL_CAPSULE | Freq: Every day | ORAL | Status: DC
  Administered 2020-05-03 – 2020-05-13 (×11): 30 mg via ORAL
  Filled 2020-05-02 (×12): qty 1

## 2020-05-02 MED ORDER — DOCUSATE SODIUM 100 MG PO CAPS: 100.0000 mg | ORAL_CAPSULE | Freq: Two times a day (BID) | ORAL | Status: DC | PRN

## 2020-05-02 MED ORDER — HEPARIN SODIUM (PORCINE) 5000 UNIT/ML IJ SOLN
5000.0000 [IU] | Freq: Three times a day (TID) | INTRAMUSCULAR | Status: DC
  Administered 2020-05-03 – 2020-05-13 (×30): 5000 [IU] via SUBCUTANEOUS
  Filled 2020-05-02 (×30): qty 1

## 2020-05-02 MED ORDER — SODIUM BICARBONATE 650 MG PO TABS
1300.0000 mg | ORAL_TABLET | Freq: Two times a day (BID) | ORAL | Status: DC
  Administered 2020-05-02 – 2020-05-13 (×22): 1300 mg via ORAL
  Filled 2020-05-02 (×25): qty 2

## 2020-05-02 MED ORDER — INSULIN ASPART 100 UNIT/ML ~~LOC~~ SOLN
0.0000 [IU] | Freq: Every day | SUBCUTANEOUS | Status: DC
  Administered 2020-05-02: 4 [IU] via SUBCUTANEOUS
  Administered 2020-05-10: 3 [IU] via SUBCUTANEOUS
  Filled 2020-05-02 (×3): qty 1

## 2020-05-02 MED ORDER — HYDRALAZINE HCL 20 MG/ML IJ SOLN
10.0000 mg | INTRAMUSCULAR | Status: DC | PRN
  Filled 2020-05-02: qty 0.5

## 2020-05-02 MED ORDER — ONDANSETRON HCL 4 MG/2ML IJ SOLN
4.0000 mg | Freq: Four times a day (QID) | INTRAMUSCULAR | Status: DC | PRN
  Administered 2020-05-03 – 2020-05-04 (×2): 4 mg via INTRAVENOUS
  Filled 2020-05-02 (×2): qty 2

## 2020-05-02 MED ORDER — COLESEVELAM HCL 625 MG PO TABS
1875.0000 mg | ORAL_TABLET | Freq: Two times a day (BID) | ORAL | Status: DC
  Administered 2020-05-03 – 2020-05-13 (×21): 1875 mg via ORAL
  Filled 2020-05-02 (×22): qty 3

## 2020-05-02 NOTE — H&P (Signed)
History and Physical    Gregory Moody PYK:[id number] DOB: 16-Sep-1976 DOA: 05/02/2020  PCP: [facility]  Patient coming from: Home  I have personally briefly reviewed patient's old medical records in [place]  Chief Complaint: Weakness, dizziness  HPI: Gregory Moody is a 44 y.o. male with medical history significant of  Type 1 diabetes mellitus with associated chronic kidney disease, iron deficiency anemia, chronic diarrhea, urinary retention, unintentional weight loss, severe protein calorie malnutrition who presents for evaluation of weakness and dizziness and inability to tolerate p.o.  The patient has a history of COVID-19 infection from a July 2020.  He also has a history of recurrent admissions and previous admission to behavioral health unit just due to poor self-care.  This particular admission the patient presents with complaints that are similar to prior occurrences.  Patient states that he has been losing weight, he is dizzy and he is unable to care for himself.  Patient states he has a stable place to live.  He lives with his mother.  He denies any active alcohol or drug use.  He states it has been several years before he is used intoxicating substances.  On initial evaluation the patient appears quite weak and emaciated.  His BMI is 15.  Initial laboratory investigation significant for creatinine of 3.5 which appears at or near his baseline, hyponatremia 127, anemia 8.6, glucose 345.  On previous admission he had acute urinary retention and had a Foley catheter placed.  Follow-up instruction was provided however appears this patient had not followed up for unclear reasons.  He states that he has had leakage of urine and blood products around his urinary catheter.  Been in place for a month.  Given his severe weakness and recurrent admissions will place in observation status and hospital service to follow.  ED Course: On initial evaluation  patient was found to be hypotensive with systolic blood pressure in the 60s.  Patient responded readily to IV fluid resuscitation and blood pressure responded 120/82.  Remainder vital signs within normal limits.  Patient was given insulin for hyperglycemia.  He was given IV fluid resuscitation.  Hospitalist called for admission.   Review of Systems: As per HPI otherwise 14 point review of systems negative.   Past Medical History:  Diagnosis Date  . COVID-19   . Diabetes mellitus without complication ([place])   . Gastroparesis   . Tuberculosis     Past Surgical History:  Procedure Laterality Date  . COLONOSCOPY N/A 01/25/2020   Procedure: COLONOSCOPY;  Surgeon: Toledo, Benay Pike, MD;  Location: [facility] ENDOSCOPY;  Service: Gastroenterology;  Laterality: N/A;  . ESOPHAGOGASTRODUODENOSCOPY N/A 02/03/2019   Procedure: ESOPHAGOGASTRODUODENOSCOPY (EGD);  Surgeon: Toledo, Benay Pike, MD;  Location: [facility] ENDOSCOPY;  Service: Gastroenterology;  Laterality: N/A;     reports that he has been smoking cigars. He has never used smokeless tobacco. He reports that he does not drink alcohol and does not use drugs.  No Known Allergies  Family History  Problem Relation Age of Onset  . Diabetes Mother   . Diabetes Father    Family history of diabetes in both his mother and father  Prior to Admission medications   Medication Sig Start Date End Date Taking? Authorizing Provider  aspirin EC 81 MG tablet Take 81 mg by mouth daily.    [provider]  benztropine (COGENTIN) 0.5 MG tablet Take 1 tablet (0.5 mg total) by mouth 2 (two) times daily. 04/09/20   Pokhrel, Corrie Mckusick, MD  colesevelam (WELCHOL) 625 MG tablet Take 3 tablets (1,875 mg total) by mouth 2 (two) times daily with a meal. 02/09/20   Leslye Peer, Richard, MD  docusate sodium (COLACE) 100 MG capsule Take 1 capsule (100 mg total) by mouth 2 (two) times daily as needed for mild constipation. 03/28/20   Lorella Nimrod, MD  DULoxetine (CYMBALTA) 30 MG  capsule Take 1 capsule (30 mg total) by mouth daily. 04/10/20   Pokhrel, Corrie Mckusick, MD  haloperidol (HALDOL) 0.5 MG tablet Take 1 tablet (0.5 mg total) by mouth 2 (two) times daily. 04/09/20   Pokhrel, Corrie Mckusick, MD  insulin aspart (NOVOLOG) 100 UNIT/ML injection Inject 0-5 Units into the skin at bedtime. 03/28/20   Lorella Nimrod, MD  insulin aspart (NOVOLOG) 100 UNIT/ML injection Inject 0-9 Units into the skin 3 (three) times daily with meals. 03/28/20   Lorella Nimrod, MD  insulin aspart (NOVOLOG) 100 UNIT/ML injection Inject 2 Units into the skin 3 (three) times daily with meals. 03/28/20   Lorella Nimrod, MD  loperamide (IMODIUM) 2 MG capsule Take 1 capsule (2 mg total) by mouth 2 (two) times daily as needed for diarrhea or loose stools. 02/09/20   Loletha Grayer, MD  pantoprazole (PROTONIX) 40 MG tablet Take 1 tablet (40 mg total) by mouth daily. 04/03/20   Clapacs, Madie Reno, MD  sodium bicarbonate 650 MG tablet Take 2 tablets (1,300 mg total) by mouth 2 (two) times daily. 04/09/20   Flora Lipps, MD    Physical Exam: Vitals:   05/02/20 1629 05/02/20 1639 05/02/20 1800 05/02/20 1900  BP:  120/82 (!) 135/92 (!) 143/97  Pulse:  76 70 69  Resp:  18 14   SpO2: 100% 100% 100% 100%  Weight:      Height:         Vitals:   05/02/20 1629 05/02/20 1639 05/02/20 1800 05/02/20 1900  BP:  120/82 (!) 135/92 (!) 143/97  Pulse:  76 70 69  Resp:  18 14   SpO2: 100% 100% 100% 100%  Weight:      Height:      Constitutional: No acute distress, appears weak and emaciated, appears chronically ill Eyes: PERRL, lids and conjunctivae normal ENMT: Mucous membranes are moist. Posterior pharynx clear of any exudate or lesions.poor dentition Neck: normal, supple, no masses, no thyromegaly Respiratory: Clear to auscultation bilaterally.  Air entry equal.  No adventitious breath sounds.  Normal work of breathing Cardiovascular: Tachycardic, regular rhythm, no murmurs, no lower extremity edema Abdomen: no tenderness, no  masses palpated.  Bowel sounds positive.  Musculoskeletal: Muscle wasting noted in bilateral upper and lower extremities.  No contractures.  Good range of motion Skin: Scattered excoriations.  No induration Neurologic: Cranial nerves grossly intact, sensation intact, strength 5 out of 5 psychiatric: Normal judgment and insight. Alert and oriented x 3.  Flattened affect.    Labs on Admission: I have personally reviewed following labs and imaging studies  CBC: Recent Labs  Lab 05/02/20 1558  WBC 10.2  NEUTROABS 8.5*  HGB 8.6*  HCT 25.6*  MCV 88.3  PLT 782*   Basic Metabolic Panel: Recent Labs  Lab 05/02/20 1558  NA 127*  K 4.3  CL 97*  CO2 21*  GLUCOSE 345*  BUN 59*  CREATININE 3.51*  CALCIUM 8.7*   GFR: Estimated Creatinine Clearance: 17.1 mL/min (A) (by C-G formula based on SCr of 3.51 mg/dL (H)). Liver Function Tests: Recent Labs  Lab 05/02/20 1558  AST 21  ALT <5  ALKPHOS 110  BILITOT 0.8  PROT 7.9  ALBUMIN 2.8*   No results for input(s): LIPASE, AMYLASE in the last 168 hours. No results for input(s): AMMONIA in the last 168 hours. Coagulation Profile: No results for input(s): INR, PROTIME in the last 168 hours. Cardiac Enzymes: No results for input(s): CKTOTAL, CKMB, CKMBINDEX, TROPONINI in the last 168 hours. BNP (last 3 results) No results for input(s): PROBNP in the last 8760 hours. HbA1C: No results for input(s): HGBA1C in the last 72 hours. CBG: No results for input(s): GLUCAP in the last 168 hours. Lipid Profile: No results for input(s): CHOL, HDL, LDLCALC, TRIG, CHOLHDL, LDLDIRECT in the last 72 hours. Thyroid Function Tests: No results for input(s): TSH, T4TOTAL, FREET4, T3FREE, THYROIDAB in the last 72 hours. Anemia Panel: No results for input(s): VITAMINB12, FOLATE, FERRITIN, TIBC, IRON, RETICCTPCT in the last 72 hours. Urine analysis:    Component Value Date/Time   COLORURINE YELLOW (A) 04/17/2020 0825   APPEARANCEUR CLOUDY (A)  04/17/2020 0825   LABSPEC 1.010 04/17/2020 0825   PHURINE 5.0 04/17/2020 0825   GLUCOSEU 150 (A) 04/17/2020 0825   HGBUR MODERATE (A) 04/17/2020 0825   BILIRUBINUR NEGATIVE 04/17/2020 0825   KETONESUR NEGATIVE 04/17/2020 0825   PROTEINUR 30 (A) 04/17/2020 0825   NITRITE NEGATIVE 04/17/2020 0825   LEUKOCYTESUR LARGE (A) 04/17/2020 0825    Radiological Exams on Admission: DG Chest Port 1 View  Result Date: 05/02/2020 CLINICAL DATA:  Weakness and hypotension EXAM: PORTABLE CHEST 1 VIEW COMPARISON:  04/17/2020, CT 12/29/2019 FINDINGS: A right upper lobe ovoid nodular opacity is mildly decreased compared to 04/17/2020. Normal heart size. No pneumothorax. IMPRESSION: Mildly decreased right upper lobe nodular opacity as compared to 04/17/2020. No interval acute airspace disease. Electronically Signed   By: Donavan Foil M.D.   On: 05/02/2020 16:45    EKG: Independently reviewed.  Sinus rhythm.  Normal rate .  initial EKG read as acute MI however consistent with laboratory and physical examination.  Repeating.  Follow-up results  Assessment/Plan Active Problems:   Weakness  Weakness/fatigue Dizziness Patient has a history of recurrent admissions for similar complaints He seems to have some issues with poor self-care and medication adherence He would likely benefit from involvement of TOC, diabetes coordinator, dietitian while in house Plan: Placed observation, MedSurg Intravenous fluid resuscitation TOC consult Diabetes coordinator consult Dietitian consult PT and OT consults  Insulin-dependent diabetes mellitus, uncontrolled hyperglycemia Last hemoglobin A1c 10.8 indicating poor control Patient appears very thin and emaciated Plan: Sensitive sliding scale We will assess insulin requirement over 24-hour window and can add long-acting insulin as needed Carb modified diet Diabetes coordinator consult  Chronic kidney disease stage IV Patient's creatinine appears to range between  3.35 and 5 Appears at or near baseline at this time Likely secondary to uncontrolled diabetes IV fluids Continue to monitor  Urinary retention Patient had a Foley catheter placed approximately a month ago He was instructed to follow-up for removal however appears that does not happen Urinary retention likely secondary to diabetic neuropathy Plan: Discontinue Foley Foley replaced in the ED Consider voiding trial during hospitalization  Adult failure to thrive Hypoalbuminemia Poor p.o. intake Severe protein calorie malnutrition Likely multifactorial Certainly a contributing factor from a uncontrolled diabetes however suspect psychiatric monitor as well IV fluids Carb modified diet Dietitian consult  Depression Previous history of suicidal ideation Denied on my evaluation today Continue previously prescribed psychiatric regimen Consider referral to psychiatry or outpatient community mental health post discharge     DVT prophylaxis: Subcu  heparin  code Status: Full Family Communication: None at bedside Disposition Plan: Anticipate return to previous home environment  consults called: None Admission status: Observation, MedSurg   Sidney Ace MD [facility]   If 7PM-7AM, please contact night-coverage 05/02/2020, 7:15 PM

## 2020-05-02 NOTE — ED Triage Notes (Signed)
Pt to ED via POV from home. Pt stating he has been falling frequently and feels weak. Pt with catheter in place . Pt stating generalized pain all over.  Pt BP 67/47 in triage. Pt states recent weight loss. Pt states he has been unable to eat for the last 5 days. Pt stating catheter has been in place 2 months and recently has started leaking.

## 2020-05-02 NOTE — ED Provider Notes (Signed)
[facility] Emergency Department Provider Note ____________________________________________   None    (approximate)  I have reviewed the triage vital signs and the nursing notes.   HISTORY  Chief Complaint Weakness and Hypotension    HPI Gregory Moody is a 44 y.o. male PMH as noted below including history of insulin-dependent diabetes, gastroparesis, kidney disease, urinary retention, and other PMH as noted below presents with generalized weakness, multiple falls, and decreased p.o. intake.  He states that this has been worse over the last week.  He also reports generalized pain all over his body, as well as weight loss.  He has not eaten anything in the last 5 days.  He states that he has had blood and urine leaking around his urinary catheter which has been in place for at least a month.  Past Medical History:  Diagnosis Date  . COVID-19   . Diabetes mellitus without complication ([place])   . Gastroparesis   . Tuberculosis     Patient Active Problem List   Diagnosis Date Noted  . Weakness 05/02/2020  . Type 1 diabetes mellitus with diabetic chronic kidney disease ([place])   . Goals of care, counseling/discussion   . Palliative care by specialist   . Recurrent major depression-severe ([place]) 03/31/2020  . Hyperkalemia   . Iron deficiency anemia   . Weight loss   . Chronic diarrhea 02/07/2020  . Abnormal CT of the chest   . Acute urinary retention 02/06/2020  . Hyperglycemia due to type 2 diabetes mellitus ([place]) 02/06/2020  . History of Clostridium difficile colitis 02/06/2020  . Generalized weakness 02/06/2020  . History of COVID-19 02/06/2020  . Diarrhea 01/21/2020  . Tobacco abuse 01/21/2020  . C. difficile colitis 01/21/2020  . Urinary retention 01/21/2020  . Diabetes mellitus without complication ([place]) 61/44/3154  . Protein-calorie malnutrition, severe 01/02/2020  . Odynophagia   . DKA (diabetic ketoacidoses) ([place]) 12/27/2019  .  Depression 12/27/2019  . DKA, type 2 ([place]) 12/27/2019  . Anemia of chronic disease 04/25/2019  . Hypomagnesemia 04/25/2019  . Hypotension 04/23/2019  . CAP (community acquired pneumonia) due to MSSA (methicillin sensitive Staphylococcus aureus) ([place]) 04/14/2019  . COVID-19 virus infection 04/14/2019  . Pressure injury of skin 03/31/2019  . Acute renal failure (ARF) ([place]) 03/06/2019  . CAP (community acquired pneumonia) 02/14/2019  . AKI (acute kidney injury) ([place]) 02/02/2019  . ARF (acute renal failure) ([place]) 01/03/2019  . Malnutrition of moderate degree 02/24/2018  . GERD (gastroesophageal reflux disease) 02/23/2018  . Diabetic gastroparesis ([place]) 02/23/2018  . Diabetic foot ulcer ([place]) 02/23/2018  . Aspiration pneumonia ([place]) 04/21/2017  . Hypoglycemia 04/21/2017  . Diabetes mellitus with hyperglycemia ([place]) 04/21/2017  . Hip fracture, unspecified laterality, closed, initial encounter ([place]) 04/17/2017  . Hyperglycemia 04/17/2017  . Malnutrition ([place]) 04/09/2016  . Cavitary lesion of lung 04/06/2016    Past Surgical History:  Procedure Laterality Date  . COLONOSCOPY N/A 01/25/2020   Procedure: COLONOSCOPY;  Surgeon: Toledo, Benay Pike, MD;  Location: [facility] ENDOSCOPY;  Service: Gastroenterology;  Laterality: N/A;  . ESOPHAGOGASTRODUODENOSCOPY N/A 02/03/2019   Procedure: ESOPHAGOGASTRODUODENOSCOPY (EGD);  Surgeon: Toledo, Benay Pike, MD;  Location: [facility] ENDOSCOPY;  Service: Gastroenterology;  Laterality: N/A;    Prior to Admission medications   Medication Sig Start Date End Date Taking? Authorizing Provider  aspirin EC 81 MG tablet Take 81 mg by mouth daily.   Yes [provider]  benztropine (COGENTIN) 0.5 MG tablet Take 1 tablet (0.5 mg total) by mouth 2 (two) times  daily. 04/09/20  Yes Pokhrel, Corrie Mckusick, MD  colesevelam (WELCHOL) 625 MG tablet Take 3 tablets (1,875 mg total) by mouth 2 (two) times daily with a meal. 02/09/20  Yes Wieting, Richard, MD  docusate sodium (COLACE)  100 MG capsule Take 1 capsule (100 mg total) by mouth 2 (two) times daily as needed for mild constipation. 03/28/20  Yes Lorella Nimrod, MD  DULoxetine (CYMBALTA) 30 MG capsule Take 1 capsule (30 mg total) by mouth daily. 04/10/20  Yes Pokhrel, Laxman, MD  haloperidol (HALDOL) 0.5 MG tablet Take 1 tablet (0.5 mg total) by mouth 2 (two) times daily. 04/09/20  Yes Pokhrel, Laxman, MD  insulin aspart (NOVOLOG) 100 UNIT/ML injection Inject 0-9 Units into the skin 3 (three) times daily before meals. 3 times daily before meals and at bedtime   Yes [provider]  loperamide (IMODIUM) 2 MG capsule Take 1 capsule (2 mg total) by mouth 2 (two) times daily as needed for diarrhea or loose stools. 02/09/20  Yes Wieting, Richard, MD  NEXIUM 40 MG capsule Take 40 mg by mouth daily. 04/21/20  Yes [provider]  pantoprazole (PROTONIX) 40 MG tablet Take 1 tablet (40 mg total) by mouth daily. 04/03/20  Yes Clapacs, Madie Reno, MD  sodium bicarbonate 650 MG tablet Take 2 tablets (1,300 mg total) by mouth 2 (two) times daily. 04/09/20  Yes Pokhrel, Corrie Mckusick, MD    Allergies Patient has no known allergies.  Family History  Problem Relation Age of Onset  . Diabetes Mother   . Diabetes Father     Social History Social History   Tobacco Use  . Smoking status: Current Some Day Smoker    Types: Cigars  . Smokeless tobacco: Never Used  Vaping Use  . Vaping Use: Never used  Substance Use Topics  . Alcohol use: No  . Drug use: No    Review of Systems  Constitutional: No fever.  Positive for weakness. Eyes: No redness. ENT: No sore throat. Cardiovascular: Denies chest pain. Respiratory: Denies shortness of breath. Gastrointestinal: No vomiting or diarrhea. Genitourinary: Positive for blood around the Foley catheter. Musculoskeletal: Positive for generalized body pain. Skin: Negative for rash. Neurological: Negative for headache.   ____________________________________________   PHYSICAL  EXAM:  VITAL SIGNS: ED Triage Vitals  Enc Vitals Group     BP 05/02/20 1542 (!) 67/47     Pulse Rate 05/02/20 1542 73     Resp 05/02/20 1542 16     Temp --      Temp src --      SpO2 --      Weight 05/02/20 1550 100 lb (45.4 kg)     Height 05/02/20 1550 5\' 8"  (1.727 m)     Head Circumference --      Peak Flow --      Pain Score 05/02/20 1550 0     Pain Loc --      Pain Edu? --      Excl. in [place]? --     Constitutional: Alert and oriented.  Extremely weak and emaciated appearing but in no acute distress. Eyes: Conjunctivae are normal.  EOMI.  Purely. Head: Atraumatic. Nose: No congestion/rhinnorhea. Mouth/Throat: Mucous membranes are dry.   Neck: Normal range of motion.  Cardiovascular: Normal rate, regular rhythm. Grossly normal heart sounds.  Good peripheral circulation. Respiratory: Normal respiratory effort.  No retractions. Lungs CTAB. Gastrointestinal: Soft and nontender. No distention.  Genitourinary: Normal external genitalia.  Blood and purulent material at the penile meatus around  the urinary catheter.  Bloody urine present in the Foley bag. Musculoskeletal: No lower extremity edema.  Extremities warm and well perfused.  Neurologic:  Normal speech and language. No gross focal neurologic deficits are appreciated.  Skin:  Skin is warm and dry. No rash noted. Psychiatric: Somewhat flat affect.  ____________________________________________   LABS (all labs ordered are listed, but only abnormal results are displayed)  Labs Reviewed  CBC WITH DIFFERENTIAL/PLATELET - Abnormal; Notable for the following components:      Result Value   RBC 2.90 (*)    Hemoglobin 8.6 (*)    HCT 25.6 (*)    Platelets 407 (*)    Neutro Abs 8.5 (*)    All other components within normal limits  COMPREHENSIVE METABOLIC PANEL - Abnormal; Notable for the following components:   Sodium 127 (*)    Chloride 97 (*)    CO2 21 (*)    Glucose, Bld 345 (*)    BUN 59 (*)    Creatinine, Ser 3.51  (*)    Calcium 8.7 (*)    Albumin 2.8 (*)    GFR calc non Af Amer 20 (*)    GFR calc Af Amer 23 (*)    All other components within normal limits  BLOOD GAS, VENOUS - Abnormal; Notable for the following components:   pCO2, Ven 36 (*)    pO2, Ven 48.0 (*)    Bicarbonate 17.3 (*)    Acid-base deficit 8.5 (*)    All other components within normal limits  CULTURE, BLOOD (ROUTINE X 2)  CULTURE, BLOOD (ROUTINE X 2)  SARS CORONAVIRUS 2 BY RT PCR (HOSPITAL ORDER, [facility])  LACTIC ACID, PLASMA  LACTIC ACID, PLASMA  URINALYSIS, COMPLETE (UACMP) WITH MICROSCOPIC  BASIC METABOLIC PANEL  CBC  TSH  HEMOGLOBIN A1C  TROPONIN I (HIGH SENSITIVITY)  TROPONIN I (HIGH SENSITIVITY)   ____________________________________________  EKG  ED ECG REPORT I, Arta Silence, the attending physician, personally viewed and interpreted this ECG.  Date: 05/02/2020 EKG Time: 1556 Rate: 74 Rhythm: normal sinus rhythm QRS Axis: normal Intervals: normal ST/T Wave abnormalities: Nonspecific ST abnormalities inferiorly, read by machine as possible acute ischemia Narrative Interpretation: Nonspecific inferior ST abnormalities; no significant change when compared to EKG of 04/18/2020  ____________________________________________  RADIOLOGY  CXR: No focal infiltrate or edema  ____________________________________________   PROCEDURES  Procedure(s) performed: No  Procedures  Critical Care performed: Yes  CRITICAL CARE Performed by: Arta Silence   Total critical care time: 30 minutes  Critical care time was exclusive of separately billable procedures and treating other patients.  Critical care was necessary to treat or prevent imminent or life-threatening deterioration.  Critical care was time spent personally by me on the following activities: development of treatment plan with patient and/or surrogate as well as nursing, discussions with consultants,  evaluation of patient's response to treatment, examination of patient, obtaining history from patient or surrogate, ordering and performing treatments and interventions, ordering and review of laboratory studies, ordering and review of radiographic studies, pulse oximetry and re-evaluation of patient's condition. ____________________________________________   INITIAL IMPRESSION / ASSESSMENT AND PLAN / ED COURSE  Pertinent labs & imaging results that were available during my care of the patient were reviewed by me and considered in my medical decision making (see chart for details).  44 year old male with PMH as noted above including insulin-dependent type 2 diabetes, gastroparesis, recurrent DKA, chronic malnutrition, and multiple recent hospitalizations presents with recurrent generalized weakness, multiple falls,  inability to eat, and pain all over.  I reviewed the past medical records in epic.  The patient has had multiple prolonged admissions to the hospital here for similar presentations, with DKA and AKI.  Most recently he was admitted last month after similar presentation in the ED, discharged from the medical service and transfer to the behavioral health service, but then readmitted to medicine due to deteriorating clinical condition.  The patient is being treated for depression.  He is chronically noncompliant with medications and does not take care of himself.  However he has no history of active self-harm.  Attempts have been made to get terrible home health services, but nobody responded at the home.  On exam today, the patient is extremely weak and emaciated appearing.  He is hypotensive with otherwise normal vital signs.  Neurologic exam is nonfocal.  There is no visible acute trauma.  The remainder of the exam is as described above.  EKG shows minimal ST elevation in the inferior leads, however with no reciprocal changes and with overall similar morphology to prior EKGs.  There is no  evidence of acute MI.  Overall presentation is consistent with exacerbation of the patient's chronic conditions, likely DKA related to medication noncompliance, dehydration or other metabolic abnormality related to gastroparesis, AKI, or less likely possible acute infection/sepsis.  We will give fluids, obtain lab work-up, chest x-ray, change the Foley catheter and reassess.  The patient will need admission.  ----------------------------------------- 7:27 PM on 05/02/2020 -----------------------------------------  Lab work-up reveals chronic findings.  There is no evidence of AKA.  The troponin is negative, as the lactate.  Patient is pending urinalysis, however given the high clinical suspicion for UTI I have ordered IV ceftriaxone.  I discussed the case with Dr. Priscella Mann from the hospitalist service for admission.  ____________________________________________   FINAL CLINICAL IMPRESSION(S) / ED DIAGNOSES  Final diagnoses:  Hypotension, unspecified hypotension type  Weakness      NEW MEDICATIONS STARTED DURING THIS VISIT:  New Prescriptions   No medications on file     Note:  This document was prepared using Dragon voice recognition software and may include unintentional dictation errors.    Arta Silence, MD 05/02/20 [phone]

## 2020-05-03 DIAGNOSIS — R627 Adult failure to thrive: Secondary | ICD-10-CM | POA: Diagnosis present

## 2020-05-03 DIAGNOSIS — Z20822 Contact with and (suspected) exposure to covid-19: Secondary | ICD-10-CM | POA: Diagnosis present

## 2020-05-03 DIAGNOSIS — E871 Hypo-osmolality and hyponatremia: Secondary | ICD-10-CM | POA: Diagnosis present

## 2020-05-03 DIAGNOSIS — R531 Weakness: Secondary | ICD-10-CM | POA: Diagnosis present

## 2020-05-03 DIAGNOSIS — K529 Noninfective gastroenteritis and colitis, unspecified: Secondary | ICD-10-CM | POA: Diagnosis present

## 2020-05-03 DIAGNOSIS — R296 Repeated falls: Secondary | ICD-10-CM | POA: Diagnosis present

## 2020-05-03 DIAGNOSIS — Z794 Long term (current) use of insulin: Secondary | ICD-10-CM | POA: Diagnosis not present

## 2020-05-03 DIAGNOSIS — F17290 Nicotine dependence, other tobacco product, uncomplicated: Secondary | ICD-10-CM | POA: Diagnosis present

## 2020-05-03 DIAGNOSIS — R339 Retention of urine, unspecified: Secondary | ICD-10-CM | POA: Diagnosis present

## 2020-05-03 DIAGNOSIS — D638 Anemia in other chronic diseases classified elsewhere: Secondary | ICD-10-CM | POA: Diagnosis present

## 2020-05-03 DIAGNOSIS — I951 Orthostatic hypotension: Secondary | ICD-10-CM | POA: Diagnosis present

## 2020-05-03 DIAGNOSIS — N471 Phimosis: Secondary | ICD-10-CM | POA: Diagnosis present

## 2020-05-03 DIAGNOSIS — R64 Cachexia: Secondary | ICD-10-CM | POA: Diagnosis present

## 2020-05-03 DIAGNOSIS — Z681 Body mass index (BMI) 19 or less, adult: Secondary | ICD-10-CM | POA: Diagnosis not present

## 2020-05-03 DIAGNOSIS — E1022 Type 1 diabetes mellitus with diabetic chronic kidney disease: Secondary | ICD-10-CM | POA: Diagnosis present

## 2020-05-03 DIAGNOSIS — E785 Hyperlipidemia, unspecified: Secondary | ICD-10-CM | POA: Diagnosis present

## 2020-05-03 DIAGNOSIS — F329 Major depressive disorder, single episode, unspecified: Secondary | ICD-10-CM | POA: Diagnosis present

## 2020-05-03 DIAGNOSIS — E43 Unspecified severe protein-calorie malnutrition: Secondary | ICD-10-CM | POA: Diagnosis present

## 2020-05-03 DIAGNOSIS — E1043 Type 1 diabetes mellitus with diabetic autonomic (poly)neuropathy: Secondary | ICD-10-CM | POA: Diagnosis present

## 2020-05-03 DIAGNOSIS — E1065 Type 1 diabetes mellitus with hyperglycemia: Secondary | ICD-10-CM | POA: Diagnosis present

## 2020-05-03 DIAGNOSIS — N184 Chronic kidney disease, stage 4 (severe): Secondary | ICD-10-CM | POA: Diagnosis present

## 2020-05-03 DIAGNOSIS — N4821 Abscess of corpus cavernosum and penis: Secondary | ICD-10-CM | POA: Diagnosis present

## 2020-05-03 DIAGNOSIS — K3184 Gastroparesis: Secondary | ICD-10-CM | POA: Diagnosis present

## 2020-05-03 DIAGNOSIS — N179 Acute kidney failure, unspecified: Secondary | ICD-10-CM | POA: Diagnosis present

## 2020-05-03 DIAGNOSIS — N319 Neuromuscular dysfunction of bladder, unspecified: Secondary | ICD-10-CM | POA: Diagnosis present

## 2020-05-03 LAB — BASIC METABOLIC PANEL
Anion gap: 8 (ref 5–15)
BUN: 55 mg/dL — ABNORMAL HIGH (ref 6–20)
CO2: 20 mmol/L — ABNORMAL LOW (ref 22–32)
Calcium: 8.2 mg/dL — ABNORMAL LOW (ref 8.9–10.3)
Chloride: 103 mmol/L (ref 98–111)
Creatinine, Ser: 2.89 mg/dL — ABNORMAL HIGH (ref 0.61–1.24)
GFR calc Af Amer: 29 mL/min — ABNORMAL LOW (ref >60)
GFR calc non Af Amer: 25 mL/min — ABNORMAL LOW (ref >60)
Glucose, Bld: 306 mg/dL — ABNORMAL HIGH (ref 70–99)
Potassium: 4 mmol/L (ref 3.5–5.1)
Sodium: 131 mmol/L — ABNORMAL LOW (ref 135–145)

## 2020-05-03 LAB — CBC
HCT: 25.2 % — ABNORMAL LOW (ref 39.0–52.0)
Hemoglobin: 8.6 g/dL — ABNORMAL LOW (ref 13.0–17.0)
MCH: 29.7 pg (ref 26.0–34.0)
MCHC: 34.1 g/dL (ref 30.0–36.0)
MCV: 86.9 fL (ref 80.0–100.0)
Platelets: 370 10*3/uL (ref 150–400)
RBC: 2.9 MIL/uL — ABNORMAL LOW (ref 4.22–5.81)
RDW: 13.2 % (ref 11.5–15.5)
WBC: 11.5 10*3/uL — ABNORMAL HIGH (ref 4.0–10.5)
nRBC: 0 % (ref 0.0–0.2)

## 2020-05-03 LAB — LIPID PANEL
Cholesterol: 128 mg/dL (ref 0–200)
HDL: 20 mg/dL — ABNORMAL LOW (ref >40)
LDL Cholesterol: 76 mg/dL (ref 0–99)
Total CHOL/HDL Ratio: 6.4 RATIO
Triglycerides: 158 mg/dL — ABNORMAL HIGH (ref <150)
VLDL: 32 mg/dL (ref 0–40)

## 2020-05-03 LAB — GLUCOSE, CAPILLARY
Glucose-Capillary: 280 mg/dL — ABNORMAL HIGH (ref 70–99)
Glucose-Capillary: 245 mg/dL — ABNORMAL HIGH (ref 70–99)
Glucose-Capillary: 203 mg/dL — ABNORMAL HIGH (ref 70–99)
Glucose-Capillary: 194 mg/dL — ABNORMAL HIGH (ref 70–99)

## 2020-05-03 LAB — HEMOGLOBIN A1C
Hgb A1c MFr Bld: 8.3 % — ABNORMAL HIGH (ref 4.8–5.6)
Mean Plasma Glucose: 191.51 mg/dL

## 2020-05-03 MED ORDER — INSULIN GLARGINE 100 UNIT/ML ~~LOC~~ SOLN
10.0000 [IU] | Freq: Every day | SUBCUTANEOUS | Status: DC
  Administered 2020-05-03 – 2020-05-04 (×2): 10 [IU] via SUBCUTANEOUS
  Filled 2020-05-03 (×2): qty 0.1

## 2020-05-03 MED ORDER — CHLORHEXIDINE GLUCONATE CLOTH 2 % EX PADS
6.0000 | MEDICATED_PAD | Freq: Every day | CUTANEOUS | Status: DC
  Administered 2020-05-04 – 2020-05-13 (×10): 6 via TOPICAL

## 2020-05-03 MED ORDER — ENSURE MAX PROTEIN PO LIQD
11.0000 [oz_av] | Freq: Two times a day (BID) | ORAL | Status: DC
  Administered 2020-05-03 – 2020-05-11 (×8): 11 [oz_av] via ORAL
  Filled 2020-05-03: qty 330

## 2020-05-03 MED ORDER — METOCLOPRAMIDE HCL 5 MG/ML IJ SOLN
5.0000 mg | Freq: Four times a day (QID) | INTRAMUSCULAR | Status: DC | PRN
  Administered 2020-05-03: 5 mg via INTRAVENOUS
  Filled 2020-05-03: qty 2

## 2020-05-03 NOTE — Progress Notes (Addendum)
Pt c/o of feeling nauseous with abdominal pain this afternoon unrelieved by zofran. Reglan being initiated to see if it helps.

## 2020-05-03 NOTE — Progress Notes (Signed)
   Patient admitted to the floor for iron deficiency anemia, chronic diarrhea, urinary retention, unintentional weight loss, and severe protein calorie malnutrition. Patient is bed bound, presents with a chronic foley catheter. The patient is alert and oriented x 4. Blanchable Stage 1 on the sacrum, a stage 2 on the left hip, two open wound underneath the penis.   Patient is spanish speaking, requiring an interpreter. Patient is room air, on telemetry, BG, and incontinent. Skin assessment complete by myself and the nurse.

## 2020-05-03 NOTE — Progress Notes (Signed)
PROGRESS NOTE  Gregory Moody  DOB: 08-07-1976  PCP: Robeline DDU:[id number]  DOA: 05/02/2020  LOS: 0 days   Chief Complaint  Patient presents with   Weakness   Hypotension   Brief narrative: Gregory Moody is a 44 y.o. male with PMH significant for diabetes mellitus, gastroparesis, CKD, chronic anemia, tuberculosis, COVID-19 infection July 2020, chronic diarrhea, urinary retention, unintentional weight loss, severe protein calorie malnutrition and recurrent admissions for uncontrolled diabetes as well as a previous admission to behavioral health unit due to poor self-care.  Patient presented to the ED on 7/23  for evaluation of weakness and dizziness and inability to tolerate p.o and reports being unable to care for himself.    He lives with his mother, denies any active alcohol or drug use.   In the ED, patient was afebrile, heart rate in 60s, initial blood pr later as low at 67/47 which improved with normal saline bolus. Oxygen saturation 100% room air. Weak and emaciated, BMI 15 Labs with sodium 127, BUN/creatinine 59/3.51, blood glucose related to 345, hemoglobin low at 8.6 Urinalysis with hazy straw-colored urine with rare bacteria, moderate amount of hemoglobin and leukocytes.  Also, on previous admission (6/23-6/30), he had acute urinary retention and had a Foley catheter placed. Patient however did not follow-up with urology as an outpatient and continue to have catheter in place. He states that he has had leakage of urine and blood products around his urinary catheter.   Given his severe weakness and recurrent admissions will place in observation status and hospital service to follow.  Subjective: Patient was seen and examined this morning. Young [name].  Looks older for his age.  Poor oral hygiene. Not in pain.  Says he is nauseous and threw up once this morning. Lives with his mom and dad at home, mom basically takes care of  him.  Chart reviewed. No fever overnight, heart rate in 60s, blood pressure in normal range.  On room air. Blood sugar level 280 this morning, Sodium level improved to 131, creatinine improved to 2.89, hemoglobin stable at 8.6  Assessment/Plan: Generalized weakness, failure to thrive Severe protein calorie malnutrition -Multifactorial: Poor oral intake, gastroparesis, uncontrolled diabetes, chronic diarrhea, medication nonadherence, poor self-care -albumin level low at 2.8. -Education officer, museum, diabetes coordinator and dietitian involved. -PT/OT consult placed.  Insulin-dependent diabetes mellitus, Uncontrolled hyperglycemia -A1c 8.3 on 7/23. -Blood sugar level to 345 on presentation. -In his home list of medication, I see only sliding scale insulin. -Start on Lantus 10 units this morning. -Continue sliding-scale insulin with Accu-Cheks. -Modified carb diet. -Diabetes coordinator consulted.  Hyponatremia -Sodium level was low at 120s and at presentation.   -Probably hypovolemic hyponatremia also with low solute intake and partly secondary to elevated blood glucose. -With IV hydration, sodium level seems to be improving, 131 today.  Chronic anemia -Hemoglobin low at 8.6 at presentation.  Baseline with 9-10. -Continue to monitor. -No evidence of active bleeding at this time.  Chronic kidney disease stage IV -Patient's creatinine appears to range between 3.35 and 5 -Presented with creatinine of 3.51, improved to 2.9 this morning  -Continue IV fluid.   -Continue sodium bicarb 1300 mg twice daily.  Hyperlipidemia -Home meds include Aspirin 81 mg daily, WelChol 1875 mg twice daily. -Continue the same.   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 -We will try voiding trial in the hospital today.  Depression Previous history of suicidal ideation -Denies suicidal ideation at this time.   -Home meds include Cogentin 0.5 mg twice daily, Cymbalta 30 mg daily, Haldol 0.5 mg daily. -Continue the same.  Chronic diarrhea -Continue Imodium 2 mg twice daily as needed.  Gastroparesis -Continue Protonix 40 mg daily -Not sure if Reglan has been tried before  Chronic wounds -Wound care consulted.  Mobility: Encourage ambulation.  Needs PT eval Code Status:   Code Status: Full Code  Nutritional status: Body mass index is 15.2 kg/m.     Diet Order            Diet Carb Modified Fluid consistency: Thin; Room service appropriate? Yes  Diet effective now                 DVT prophylaxis: heparin injection 5,000 Units Start: 05/02/20 2200   Antimicrobials:  Not on antibiotics Fluid: Normal saline 125 mL/h  Consultants: None Family Communication:  None at bedside  Status is: Observation  The patient will require care spanning > 2 midnights and should be moved to inpatient because: Persistent severe electrolyte disturbances, Ongoing diagnostic testing needed not appropriate for outpatient work up and IV treatments appropriate due to intensity of illness or inability to take PO  Dispo: The patient is from: Home              Anticipated d/c is to: Home vs SNF              Anticipated d/c date is: 3 days              Patient currently is not medically stable to d/c.    Infusions:   sodium chloride 125 mL/hr at 05/03/20 0825    Scheduled Meds:  aspirin EC  81 mg Oral Daily   benztropine  0.5 mg Oral BID   Chlorhexidine Gluconate Cloth  6 each Topical Daily   colesevelam  1,875 mg Oral BID WC   DULoxetine  30 mg Oral Daily   folic acid  1 mg Oral Daily   haloperidol  0.5 mg Oral BID   heparin  5,000 Units Subcutaneous Q8H   insulin aspart  0-5 Units Subcutaneous QHS   insulin aspart  0-9 Units Subcutaneous  TID WC   insulin glargine  10 Units Subcutaneous Daily   multivitamin with minerals  1 tablet Oral Daily   pantoprazole  40 mg Oral Daily   sodium bicarbonate  1,300 mg Oral BID   thiamine  100 mg Oral Daily    Antimicrobials: Anti-infectives (From admission, onward)   Start     Dose/Rate Route Frequency Ordered Stop   05/02/20 1900  cefTRIAXone (ROCEPHIN) 1 g in sodium chloride 0.9 % 100 mL IVPB        1 g 200 mL/hr over 30 Minutes Intravenous  Once 05/02/20 1855 05/02/20 2059     PRN meds: acetaminophen **OR** acetaminophen, albuterol, docusate sodium, hydrALAZINE, loperamide, ondansetron **OR** ondansetron (ZOFRAN) IV, oxyCODONE, traZODone   Objective: Vitals:   05/03/20 0545 05/03/20 0750  BP: 106/75 115/76  Pulse: 69 69  Resp: 16 13  Temp: 97.8 F (36.6 C) 98 F (36.7 C)  SpO2: 100% 100%    Intake/Output Summary (Last 24 hours) at 05/03/2020 1207 Last data filed at 05/03/2020 0600 Gross per 24 hour  Intake 700 ml  Output --  Net 700 ml  Filed Weights   05/02/20 1550  Weight: 45.4 kg   Weight change:  Body mass index is 15.2 kg/m.   Physical Exam: General exam: Thin built, poor hygiene, not in physical distress Skin: No rashes, lesions or ulcers. HEENT: Atraumatic, normocephalic, supple neck, no obvious bleeding Lungs: Clear to auscultation bilaterally CVS: Regular rate and rhythm, no murmur GI/Abd soft, nontender, nondistended, bowel sound present CNS: Alert, awake, oriented x3 Psychiatry: Mood appropriate Extremities: No edema, no calf tenderness  Data Review: I have personally reviewed the laboratory data and studies available.  Recent Labs  Lab 05/02/20 1558 05/03/20 0407  WBC 10.2 11.5*  NEUTROABS 8.5*  --   HGB 8.6* 8.6*  HCT 25.6* 25.2*  MCV 88.3 86.9  PLT 407* 370   Recent Labs  Lab 05/02/20 1558 05/03/20 0407  NA 127* 131*  K 4.3 4.0  CL 97* 103  CO2 21* 20*  GLUCOSE 345* 306*  BUN 59* 55*  CREATININE 3.51* 2.89*   CALCIUM 8.7* 8.2*   Signed, Terrilee Croak, MD [facility] Pager: [phone] (Secure Chat preferred). 05/03/2020

## 2020-05-03 NOTE — Progress Notes (Signed)
   05/03/20 1200  Clinical Encounter Type  Visited With Patient  Visit Type Initial;Spiritual support;Social support  Referral From Nurse  Consult/Referral To Chaplain  Ch responded to OR. Pt did not want to see Carmichael.

## 2020-05-03 NOTE — Evaluation (Signed)
Physical Therapy Evaluation Patient Details Name: Gregory Moody MRN: [id number] DOB: 20-Mar-1976 Today's Date: 05/03/2020   History of Present Illness  Gregory Moody is a 44 y.o. male with medical history significant of diabetes mellitus, GERD, gastroparesis, DKA, anemia, tobacco abuse, esophageal candidiasis, CKD, malnutrition COVID-19 infection 04/23/2019, C. difficile colitis, and multiple admissions to the ED as well as the the behavioral health unit this year who presents for weakness and dizziness.  Clinical Impression  Patient is a 44 year old male with diffuse weakness and instability who presents in bed upon PT and OT arrival. Co-treat performed due to patient history of multiple admissions and decline in physical strength and mobility. Patient reports he has no home to return to and would like help to find a place to stay once discharged. Patient appears very malnourished with prominent bony structures. Use of interpreter # [id number] performed throughout session. Patient refused to stand up but was agreeable to attempt to sit EOB to wash back and face. Patient requires min A x 2 person assist to reach EOB for safety. Patient has poor COM and frequently requires assistance to return to upright posture when moving limbs. Patient will benefit from skilled physical therapy while in the hospital to increase strength, stability and mobility. Upon discharge patient will need SNF placement for assistance with mobility and transfers as well as to reduce fall risk.      Follow Up Recommendations SNF    Equipment Recommendations  Rolling walker with 5" wheels    Recommendations for Other Services       Precautions / Restrictions Precautions Precautions: Fall Restrictions Weight Bearing Restrictions: No      Mobility  Bed Mobility Overal bed mobility: Needs Assistance Bed Mobility: Supine to Sit;Sit to Supine     Supine to sit: Min assist;+2 for safety/equipment;HOB  elevated Sit to supine: Min assist;+2 for safety/equipment;HOB elevated   General bed mobility comments: +2 for safety/mobility, hand on shoulder for stability. assistance for sequencing.  Transfers                 General transfer comment: patient refused transfer  Ambulation/Gait             General Gait Details: Patient refused ambulation  Stairs            Wheelchair Mobility    Modified Rankin (Stroke Patients Only)       Balance Overall balance assessment: Needs assistance;History of Falls Sitting-balance support: Single extremity supported;Feet supported Sitting balance-Leahy Scale: Poor Sitting balance - Comments: frequent posterior LOB with limb movement Postural control: Posterior lean     Standing balance comment: refused to stand                             Pertinent Vitals/Pain Pain Assessment: No/denies pain    Home Living Family/patient expects to be discharged to:: Unsure                 Additional Comments: Patient doesn't believe he is able to return the house he was in before, not welcomed back. Patient reports he needs help finding a place to stay    Prior Function Level of Independence: Needs assistance   Gait / Transfers Assistance Needed: Uses an AD occasionally depending on how he is feeling. Frequently falls  ADL's / Homemaking Assistance Needed: Patient does not state amount of help needed. Requires use of a catheter.  Comments: Patient reports he has gotten  weaker and falls a lot. Isn't getting the help he needs.     Hand Dominance   Dominant Hand: Right    Extremity/Trunk Assessment   Upper Extremity Assessment Upper Extremity Assessment: Defer to OT evaluation    Lower Extremity Assessment Lower Extremity Assessment: Generalized weakness (grossly 3/5 strength with slight L knee flexion at all times, 2+/5 ankle strength.)       Communication   Communication: Prefers language other than  [place];Interpreter utilized  Cognition Arousal/Alertness: Awake/alert Behavior During Therapy: Impulsive;Restless Overall Cognitive Status: No family/caregiver present to determine baseline cognitive functioning                                 General Comments: Patient is able to follow basic commands and communicate needs/wants.      General Comments General comments (skin integrity, edema, etc.): sever malnourishment, all joints and bones highly prominant    Exercises Other Exercises Other Exercises: Patient educated on role of PT in acute care environment, safe bed mobility, and need for movement for return of strength.   Assessment/Plan    PT Assessment Patient needs continued PT services  PT Problem List Decreased strength;Decreased activity tolerance;Decreased range of motion;Decreased balance;Decreased knowledge of use of DME;Decreased coordination;Decreased mobility;Decreased skin integrity       PT Treatment Interventions DME instruction;Gait training;Stair training;Functional mobility training;Therapeutic activities;Patient/family education;Cognitive remediation;Neuromuscular re-education;Balance training;Therapeutic exercise;Wheelchair mobility training;Manual techniques    PT Goals (Current goals can be found in the Care Plan section)  Acute Rehab PT Goals Patient Stated Goal: to get stronger and have a place to go when I leave the hospital PT Goal Formulation: With patient Time For Goal Achievement: 05/17/20 Potential to Achieve Goals: Fair    Frequency Min 2X/week   Barriers to discharge Other (comment) patient reports he has no home to go to    Co-evaluation PT/OT/SLP Co-Evaluation/Treatment: Yes Reason for Co-Treatment: Complexity of the patient's impairments (multi-system involvement);Necessary to address cognition/behavior during functional activity;For patient/therapist safety;To address functional/ADL transfers PT goals addressed during  session: Mobility/safety with mobility;Balance;Strengthening/ROM OT goals addressed during session: ADL's and self-care;Proper use of Adaptive equipment and DME;Strengthening/ROM       AM-PAC PT "6 Clicks" Mobility  Outcome Measure Help needed turning from your back to your side while in a flat bed without using bedrails?: A Little Help needed moving from lying on your back to sitting on the side of a flat bed without using bedrails?: A Little Help needed moving to and from a bed to a chair (including a wheelchair)?: A Little Help needed standing up from a chair using your arms (e.g., wheelchair or bedside chair)?: A Little Help needed to walk in hospital room?: A Lot Help needed climbing 3-5 steps with a railing? : Total 6 Click Score: 15    End of Session   Activity Tolerance: Other (comment);Patient limited by fatigue (pt refuse to stand/transfer) Patient left: in bed;with call bell/phone within reach;with bed alarm set Nurse Communication: Mobility status PT Visit Diagnosis: Unsteadiness on feet (R26.81);Other abnormalities of gait and mobility (R26.89);Repeated falls (R29.6);Muscle weakness (generalized) (M62.81);History of falling (Z91.81);Difficulty in walking, not elsewhere classified (R26.2);Adult, failure to thrive (R62.7)    Time: 1041-1100 PT Time Calculation (min) (ACUTE ONLY): 19 min   Charges:   PT Evaluation $PT Eval Moderate Complexity: 1 Mod         Janna Arch, PT, DPT   05/03/2020, 11:27 AM

## 2020-05-03 NOTE — Evaluation (Signed)
Occupational Therapy Evaluation Patient Details Name: Gregory Moody MRN: [id number] DOB: 1976-03-19 Today's Date: 05/03/2020    History of Present Illness Gregory Moody is a 44 y.o. male with medical history significant of diabetes mellitus, GERD, gastroparesis, DKA, anemia, tobacco abuse, esophageal candidiasis, CKD, malnutrition COVID-19 infection 04/23/2019, C. difficile colitis, and multiple admissions to the ED as well as the the behavioral health unit this year who presents for weakness and dizziness.   Clinical Impression   Gregory Moody was seen for OT/PT co-evaluation this date, spanish translator was used t/o session. Prior to hospital admission, pt was requiring more assist from family than they can provide and reports history of falls. Pt states he is unsure where he can discharge to. Pt presents to acute OT demonstrating impaired ADL performance, functioal cognition, and functional mobility 2/2 decreased task initiation, functional strength/balance deficits, and decreased activity tolerance. Pt currently requires MIN A sup<>sit c frequent posterior LOBs in sitting - MIN A to correct. SETUP + VCs for initiation of face washing seated EOB. Simulated LBD - anticipate MIN A for bed level dressing, assist for initiation/sequencing. Pt would benefit from skilled OT to address noted impairments and functional limitations (see below for any additional details) in order to maximize safety and independence while minimizing falls risk and caregiver burden. Upon hospital discharge, recommend STR to maximize pt safety and return to PLOF.     Follow Up Recommendations  SNF ([facility])    Equipment Recommendations       Recommendations for Other Services       Precautions / Restrictions Precautions Precautions: Fall Restrictions Weight Bearing Restrictions: No      Mobility Bed Mobility Overal bed mobility: Needs Assistance Bed Mobility: Supine to Sit;Sit  to Supine     Supine to sit: Min assist;HOB elevated Sit to supine: Min assist;+2 for physical assistance;HOB elevated   General bed mobility comments: +2 for safety/mobility, hand on shoulder for stability. assistance for sequencing.  Transfers                 General transfer comment: pt declined stating "try tomorrow when I feel stronger"    Balance Overall balance assessment: Needs assistance;History of Falls Sitting-balance support: Single extremity supported;Feet supported Sitting balance-Leahy Scale: Poor Sitting balance - Comments: frequent, slow posterior LOB with limb movement, required assist to correct Postural control: Posterior lean     Standing balance comment: refused to stand                           ADL either performed or assessed with clinical judgement   ADL Overall ADL's : Needs assistance/impaired                                       General ADL Comments: SETUP + VCs for initiation face washing seated EOB. Simulated LBD anticipate MIN A for bed level dressing, assist for initiation/sequencing      Vision         Perception     Praxis      Pertinent Vitals/Pain Pain Assessment: No/denies pain     Hand Dominance Right   Extremity/Trunk Assessment Upper Extremity Assessment Upper Extremity Assessment: Generalized weakness (BUE AROM WFL )   Lower Extremity Assessment Lower Extremity Assessment: Generalized weakness       Communication Communication Communication: Prefers language other than [place];Interpreter utilized (  Unknown Jim #[id number])   Cognition Arousal/Alertness: Awake/alert Behavior During Therapy: Impulsive;Restless Overall Cognitive Status: No family/caregiver present to determine baseline cognitive functioning                                 General Comments: Patient is able to follow basic commands and communicate needs/wants.   General Comments  sever malnourishment, all  joints and bones highly prominant    Exercises Exercises: Other exercises Other Exercises Other Exercises: Pt educated re: OT role, d/c recs, importance of mobility for functional strengthening Other Exercises: face washing, simulated LBD, sitting balance/tolerance   Shoulder Instructions      Home Living Family/patient expects to be discharged to:: Unsure Living Arrangements: Children;Parent                               Additional Comments: Patient doesn't believe he is able to return the house he was in before, not welcomed back. Patient reports he needs help finding a place to stay      Prior Functioning/Environment Level of Independence: Needs assistance  Gait / Transfers Assistance Needed: Uses an AD occasionally depending on how he is feeling. Frequently falls ADL's / Homemaking Assistance Needed: Patient does not state amount of help needed. Requires use of a catheter.   Comments: Patient reports he has gotten weaker and falls a lot. Isn't getting the help he needs.        OT Problem List: Decreased strength;Decreased activity tolerance;Impaired balance (sitting and/or standing)      OT Treatment/Interventions: Self-care/ADL training;Therapeutic exercise;Energy conservation;DME and/or AE instruction;Therapeutic activities;Balance training;Patient/family education    OT Goals(Current goals can be found in the care plan section) Acute Rehab OT Goals Patient Stated Goal: to get stronger and have a place to go when I leave the hospital OT Goal Formulation: With patient Time For Goal Achievement: 05/17/20 Potential to Achieve Goals: Good ADL Goals Pt Will Perform Grooming: with modified independence;sitting (MIN VCs for initiation) Pt Will Perform Lower Body Dressing: sit to/from stand;with modified independence (c LRAD PRN) Additional ADL Goal #1: Pt will Independently verbalize x3 preferred leisure activities for improved engagement in meaningful occupations   OT Frequency: Min 1X/week   Barriers to D/C: Inaccessible home environment;Decreased caregiver support          Co-evaluation PT/OT/SLP Co-Evaluation/Treatment: Yes Reason for Co-Treatment: Necessary to address cognition/behavior during functional activity PT goals addressed during session: Mobility/safety with mobility OT goals addressed during session: ADL's and self-care      AM-PAC OT "6 Clicks" Daily Activity     Outcome Measure Help from another person eating meals?: A Little Help from another person taking care of personal grooming?: A Little Help from another person toileting, which includes using toliet, bedpan, or urinal?: A Lot Help from another person bathing (including washing, rinsing, drying)?: A Little Help from another person to put on and taking off regular upper body clothing?: A Little Help from another person to put on and taking off regular lower body clothing?: A Little 6 Click Score: 17   End of Session    Activity Tolerance: Patient limited by fatigue Patient left: in bed;with call bell/phone within reach;with bed alarm set  OT Visit Diagnosis: Unsteadiness on feet (R26.81);History of falling (Z91.81)                Time: 1040-1059 OT Time Calculation (min): 19 min  Charges:  OT General Charges $OT Visit: 1 Visit OT Evaluation $OT Eval Low Complexity: 1 Low OT Treatments $Self Care/Home Management : 8-22 mins  Dessie Coma, M.S. OTR/L  05/03/20, 2:07 PM

## 2020-05-03 NOTE — Progress Notes (Signed)
Inpatient Diabetes Program Recommendations  AACE/ADA: New Consensus Statement on Inpatient Glycemic Control (2015)  Target Ranges:  Prepandial:   less than 140 mg/dL      Peak postprandial:   less than 180 mg/dL (1-2 hours)      Critically ill patients:  140 - 180 mg/dL   Lab Results  Component Value Date   GLUCAP 280 (H) 05/03/2020   HGBA1C 8.3 (H) 05/02/2020    Review of Glycemic Control Results for ROSCOE, WITTS (MRN [id number]) as of 05/03/2020 09:55  Ref. Range 05/02/2020 19:32 05/02/2020 21:57 05/03/2020 07:52  Glucose-Capillary Latest Ref Range: 70 - 99 mg/dL 293 (H) 303 (H) 280 (H)   Diabetes history: DM Type 1 Outpatient Diabetes medications: Novolog (been on 70/30 30 units bid in 03/2020) Current orders for Inpatient glycemic control:  Lantus 10 units Daily Novolog 0-9 units tid + hs  A1c 8.3%, however Hgb 8.6 10 admissions/ED visits since January  Has been seen by DM coordinator several times in depth discussion on 02/29/20. Pt reported not eating and skipping insulin doses at that time.  Did not follow up with medication management so will need to use [facility] insulin if needed.   Creat/BUN: 2.89/55  Note Lantus 10 units being started. Will follow glucose trends.  Thanks,  Tama Headings RN, MSN, BC-ADM Inpatient Diabetes Coordinator Team Pager [phone] (8a-5p)

## 2020-05-03 NOTE — Progress Notes (Signed)
Initial Nutrition Assessment  DOCUMENTATION CODES:   Underweight (Suspect ongoing severe PCM)  INTERVENTION:  Ensure Max po BID, each supplement provides 150 kcal and 30 grams of protein  Continue MVI with minerals daily  Monitor magnesium, potassium, and phosphorus daily for at least 3 days, MD to replete as needed, as pt is at risk for refeeding syndrome given chronic malnutrition.    NUTRITION DIAGNOSIS:   Inadequate oral intake related to social / environmental circumstances, poor appetite as evidenced by per patient/family report, percent weight loss.  GOAL:   Patient will meet greater than or equal to 90% of their needs    MONITOR:   PO intake, Supplement acceptance, Weight trends, Labs, I & O's  REASON FOR ASSESSMENT:   Malnutrition Screening Tool, Consult Assessment of nutrition requirement/status  ASSESSMENT:  RD working remotely.   44 year old male with past medical history of IDDM complicated by gastroparesis, CKD, anemia, TB, COVID-19 infection (07/20), and multiple admissions due to uncontrolled diabetes, urinary retention, and malnutrition presented with weakness, dizziness, inability to tolerate po and reports being unable to care for himself.  Nutrition department familiar with patient secondary to multiple admissions and chronic malnutrition. No documented meals at this time for review, per review of RD notes from previous admissions he typically eats fairly well and is accepting of supplements. Will provide Ensure Max to aid with meeting needs.   Patient is chronically underweight, per chart weights have trended down ~20 lbs (16.5%) in the last 3 weeks which is significant. On 7/07 he weighed 54.5 kg (119.68 lb), currently pt weighs 45.4 kg (99.88 lb). Given weight trends and reported poor po pta, highly suspect ongoing chronic severe malnutrition, however unable to identify at this time without completion of NFPE.  Medications reviewed and include:  Welchol, Folic acid, Haldol, SSI, MVI, Protonix, Sodium bicarbonate, B1 IVF: NaCl @ 125 ml/hr Labs: CBGs 145,280,303 Na 131 (L), BUN 55 (H), Cr 2.89 (H), WBC 11.5 (H), Hgb 8.6 (L), K 4.0 (WNL) trending down   NUTRITION - FOCUSED PHYSICAL EXAM: Unable to complete at this time, RD working remotely.  Diet Order:   Diet Order            Diet Carb Modified Fluid consistency: Thin; Room service appropriate? Yes  Diet effective now                 EDUCATION NEEDS:   No education needs have been identified at this time  Skin:  Skin Assessment: Reviewed RN Assessment  Last BM:  7/24  Height:   Ht Readings from Last 1 Encounters:  05/02/20 5\' 8"  (1.727 m)    Weight:   Wt Readings from Last 1 Encounters:  05/02/20 45.4 kg    BMI:  Body mass index is 15.2 kg/m.  Estimated Nutritional Needs:   Kcal:  1600-1800  Protein:  80-90  Fluid:  >/= 1.6 L   Lajuan Lines, RD, LDN Clinical Nutrition After Hours/Weekend Pager # in [place]

## 2020-05-04 LAB — BASIC METABOLIC PANEL
Anion gap: 6 (ref 5–15)
BUN: 41 mg/dL — ABNORMAL HIGH (ref 6–20)
CO2: 18 mmol/L — ABNORMAL LOW (ref 22–32)
Calcium: 7.6 mg/dL — ABNORMAL LOW (ref 8.9–10.3)
Chloride: 105 mmol/L (ref 98–111)
Creatinine, Ser: 2.81 mg/dL — ABNORMAL HIGH (ref 0.61–1.24)
GFR calc Af Amer: 30 mL/min — ABNORMAL LOW (ref >60)
GFR calc non Af Amer: 26 mL/min — ABNORMAL LOW (ref >60)
Glucose, Bld: 233 mg/dL — ABNORMAL HIGH (ref 70–99)
Potassium: 3.5 mmol/L (ref 3.5–5.1)
Sodium: 129 mmol/L — ABNORMAL LOW (ref 135–145)

## 2020-05-04 LAB — CBC WITH DIFFERENTIAL/PLATELET
Abs Immature Granulocytes: 0.11 10*3/uL — ABNORMAL HIGH (ref 0.00–0.07)
Basophils Absolute: 0 10*3/uL (ref 0.0–0.1)
Basophils Relative: 0 %
Eosinophils Absolute: 0 10*3/uL (ref 0.0–0.5)
Eosinophils Relative: 0 %
HCT: 22.3 % — ABNORMAL LOW (ref 39.0–52.0)
Hemoglobin: 7.2 g/dL — ABNORMAL LOW (ref 13.0–17.0)
Immature Granulocytes: 1 %
Lymphocytes Relative: 10 %
Lymphs Abs: 1.5 10*3/uL (ref 0.7–4.0)
MCH: 29.1 pg (ref 26.0–34.0)
MCHC: 32.3 g/dL (ref 30.0–36.0)
MCV: 90.3 fL (ref 80.0–100.0)
Monocytes Absolute: 0.8 10*3/uL (ref 0.1–1.0)
Monocytes Relative: 5 %
Neutro Abs: 13.3 10*3/uL — ABNORMAL HIGH (ref 1.7–7.7)
Neutrophils Relative %: 84 %
Platelets: 361 10*3/uL (ref 150–400)
RBC: 2.47 MIL/uL — ABNORMAL LOW (ref 4.22–5.81)
RDW: 13.5 % (ref 11.5–15.5)
WBC: 15.8 10*3/uL — ABNORMAL HIGH (ref 4.0–10.5)
nRBC: 0 % (ref 0.0–0.2)

## 2020-05-04 LAB — GLUCOSE, CAPILLARY
Glucose-Capillary: 154 mg/dL — ABNORMAL HIGH (ref 70–99)
Glucose-Capillary: 169 mg/dL — ABNORMAL HIGH (ref 70–99)
Glucose-Capillary: 189 mg/dL — ABNORMAL HIGH (ref 70–99)
Glucose-Capillary: 216 mg/dL — ABNORMAL HIGH (ref 70–99)
Glucose-Capillary: 48 mg/dL — ABNORMAL LOW (ref 70–99)

## 2020-05-04 LAB — CBC
HCT: 22.3 % — ABNORMAL LOW (ref 39.0–52.0)
Hemoglobin: 7.2 g/dL — ABNORMAL LOW (ref 13.0–17.0)
MCH: 29.1 pg (ref 26.0–34.0)
MCHC: 32.3 g/dL (ref 30.0–36.0)
MCV: 90.3 fL (ref 80.0–100.0)
Platelets: 375 10*3/uL (ref 150–400)
RBC: 2.47 MIL/uL — ABNORMAL LOW (ref 4.22–5.81)
RDW: 13.3 % (ref 11.5–15.5)
WBC: 13.5 10*3/uL — ABNORMAL HIGH (ref 4.0–10.5)
nRBC: 0 % (ref 0.0–0.2)

## 2020-05-04 LAB — PHOSPHORUS: Phosphorus: 2.8 mg/dL (ref 2.5–4.6)

## 2020-05-04 LAB — MAGNESIUM: Magnesium: 1.5 mg/dL — ABNORMAL LOW (ref 1.7–2.4)

## 2020-05-04 MED ORDER — DEXTROSE 50 % IV SOLN
INTRAVENOUS | Status: AC
  Administered 2020-05-04: 50 mL
  Filled 2020-05-04: qty 50

## 2020-05-04 MED ORDER — INSULIN GLARGINE 100 UNIT/ML ~~LOC~~ SOLN
15.0000 [IU] | Freq: Every day | SUBCUTANEOUS | Status: DC
  Filled 2020-05-04: qty 0.15

## 2020-05-04 MED ORDER — DEXTROSE 250 MG/ML IV SOLN
25.0000 g | INTRAVENOUS | Status: DC | PRN
  Filled 2020-05-04: qty 100

## 2020-05-04 NOTE — TOC Initial Note (Signed)
Transition of Care [facility]) - Initial/Assessment Note    Patient Details  Name: Gregory Moody MRN: [id number] Date of Birth: 05-13-1976  Transition of [facility]) CM/SW Contact:    Anselm Pancoast, RN Phone Number: 05/04/2020, 12:28 PM  Clinical Narrative:                 Patient is well known to [place] and is aware of all resources for [facility] and medication management where patient has received medication in the past. Patient has no payer source for SNF and refuses to work with finance department/DSS for [facility] services.         Patient Goals and CMS Choice        Expected Discharge Plan and Services                                                Prior Living Arrangements/Services                       Activities of Daily Living Home Assistive Devices/Equipment: None ADL Screening (condition at time of admission) Patient's cognitive ability adequate to safely complete daily activities?: Yes Is the patient deaf or have difficulty hearing?: No Does the patient have difficulty seeing, even when wearing glasses/contacts?: No Does the patient have difficulty concentrating, remembering, or making decisions?: No Patient able to express need for assistance with ADLs?: No Does the patient have difficulty dressing or bathing?: Yes Independently performs ADLs?: No Communication: Needs assistance (Spanish speaking) Does the patient have difficulty walking or climbing stairs?: Yes Weakness of Legs: Both Weakness of Arms/Hands: Both  Permission Sought/Granted                  Emotional Assessment              Admission diagnosis:  Weakness [R53.1] Hypotension, unspecified hypotension type [I95.9] FTT (failure to thrive) in adult [R62.7] Patient Active Problem List   Diagnosis Date Noted  . FTT (failure to thrive) in adult 05/03/2020  . Weakness 05/02/2020  . CKD (chronic kidney disease), stage IV ([place]) 05/02/2020  .  Type 1 diabetes mellitus with diabetic chronic kidney disease ([place])   . Goals of care, counseling/discussion   . Palliative care by specialist   . Recurrent major depression-severe ([place]) 03/31/2020  . Hyperkalemia   . Iron deficiency anemia   . Weight loss   . Chronic diarrhea 02/07/2020  . Abnormal CT of the chest   . Acute urinary retention 02/06/2020  . Hyperglycemia due to type 2 diabetes mellitus ([place]) 02/06/2020  . History of Clostridium difficile colitis 02/06/2020  . Generalized weakness 02/06/2020  . History of COVID-19 02/06/2020  . Diarrhea 01/21/2020  . Tobacco abuse 01/21/2020  . C. difficile colitis 01/21/2020  . Urinary retention 01/21/2020  . Diabetes mellitus without complication ([place]) 54/00/8676  . Protein-calorie malnutrition, severe 01/02/2020  . Odynophagia   . DKA (diabetic ketoacidoses) ([place]) 12/27/2019  . Depression 12/27/2019  . DKA, type 2 ([place]) 12/27/2019  . Anemia of chronic disease 04/25/2019  . Hypomagnesemia 04/25/2019  . Hypotension 04/23/2019  . CAP (community acquired pneumonia) due to MSSA (methicillin sensitive Staphylococcus aureus) ([place]) 04/14/2019  . COVID-19 virus infection 04/14/2019  . Pressure injury of skin 03/31/2019  . Acute renal failure (ARF) ([place]) 03/06/2019  . CAP (community acquired pneumonia)  02/14/2019  . AKI (acute kidney injury) ([place]) 02/02/2019  . ARF (acute renal failure) ([place]) 01/03/2019  . Malnutrition of moderate degree 02/24/2018  . GERD (gastroesophageal reflux disease) 02/23/2018  . Diabetic gastroparesis ([place]) 02/23/2018  . Diabetic foot ulcer ([place]) 02/23/2018  . Aspiration pneumonia ([place]) 04/21/2017  . Hypoglycemia 04/21/2017  . Diabetes mellitus with hyperglycemia ([place]) 04/21/2017  . Hip fracture, unspecified laterality, closed, initial encounter ([place]) 04/17/2017  . Hyperglycemia 04/17/2017  . Malnutrition ([place]) 04/09/2016  . Cavitary lesion of lung 04/06/2016   PCP:  [facility]:   [place], [place] - [address] [place] [address] Phone: [phone] Fax: [phone]  [place] [address]), [place] [address] [place]) [place] [address] Phone: [phone] Fax: [phone]  [facility]ication [facility]. [place], [address] [place] [address] Phone: [phone] Fax: [phone]     Social Determinants of Health (SDOH) Interventions    Readmission Risk Interventions Readmission Risk Prevention Plan 04/09/2020 03/31/2020 02/29/2020  Transportation Screening Complete Complete Complete  PCP or Specialist Appt within 3-5 Days - - -  Social Work Consult for [place] - - -  Medication Review Press photographer) Complete Complete Complete  PCP or Specialist appointment within 3-5 days of discharge Patient refused - Complete  HRI or [place] Not Complete Complete Complete  SW Recovery Care/Counseling Consult - Complete -  Palliative Care Screening Complete Not Applicable -  [place] Not Applicable Not Applicable Complete  Some recent data might be hidden

## 2020-05-04 NOTE — Progress Notes (Signed)
PROGRESS NOTE  Gregory Moody  DOB: 1976/08/22  PCP: [facility] QMG:[id number]  DOA: 05/02/2020  LOS: 1 day   Chief Complaint  Patient presents with  . Weakness  . Hypotension   Brief narrative: Gregory Moody is a 44 y.o. male with PMH significant for diabetes mellitus, gastroparesis, CKD, chronic anemia, tuberculosis, COVID-19 infection July 2020, chronic diarrhea, urinary retention, unintentional weight loss, severe protein calorie malnutrition and recurrent admissions for uncontrolled diabetes as well as a previous admission to behavioral health unit due to poor self-care.  Patient presented to the ED on 7/23  for evaluation of weakness and dizziness and inability to tolerate p.o and reports being unable to care for himself.    He lives with his mother, denies any active alcohol or drug use.   In the ED, patient was afebrile, heart rate in 60s, initial blood pr later as low at 67/47 which improved with normal saline bolus. Oxygen saturation 100% room air. Weak and emaciated, BMI 15 Labs with sodium 127, BUN/creatinine 59/3.51, blood glucose related to 345, hemoglobin low at 8.6 Urinalysis with hazy straw-colored urine with rare bacteria, moderate amount of hemoglobin and leukocytes.  Also, on previous admission (6/23-6/30), he had acute urinary retention and had a Foley catheter placed. Patient however did not follow-up with urology as an outpatient and continue to have catheter in place. He states that he has had leakage of urine and blood products around his urinary catheter.   Given his severe weakness and recurrent admissions will place in observation status and hospital service to follow.  Subjective: Patient was seen and examined this morning. Feels energetic today. Reports improvement in nausea and abdominal pain since Reglan was started yesterday. States that he will not be able to return back to his parents house.  His mom wants him to  find help from [facility].  Chart reviewed. No fever overnight, heart rate in 60s, blood pressure in normal range.  On room air. Blood sugar level 189 this morning, sodium level 149, creatinine remains elevated at 2.81, hemoglobin down to 7.2.    Assessment/Plan: Generalized weakness, failure to thrive Severe protein calorie malnutrition -Multifactorial: Poor oral intake, gastroparesis, uncontrolled diabetes, chronic diarrhea, medication nonadherence, poor self-care -albumin level low at 2.8. -Education officer, museum, diabetes coordinator and dietitian involved. -PT/OT consult placed.  Insulin-dependent diabetes mellitus, Uncontrolled hyperglycemia -A1c 8.3 on 7/23. -Blood sugar level to 345 on presentation. -Only on sliding scale insulin at home.  Started on Lantus 10 units yesterday morning.  Blood sugar level still remains elevated.  Received last dose of Lantus this morning.  Increase Lantus to 15 units from tomorrow.  -Continue sliding-scale insulin with Accu-Cheks. -Modified carb diet. -Diabetes coordinator consulted.  Hyponatremia -Sodium level was low at 127 at presentation.   -Probably hypovolemic hyponatremia also with low solute intake and partly secondary to elevated blood glucose. -Monitor with IV hydration, 129 today.  Chronic anemia -Hemoglobin low at 8.6 at presentation.  Baseline with 9-10. -No active bleeding but hemoglobin down to 7.2 today. -Continue to monitor.  Transfuse if less than 7.  Chronic kidney disease stage IV -Patient's creatinine appears to range between 3.35 and 5 -Presented with creatinine of 3.51,  -With IV hydration, creatinine improving to 2.81 today. -Continue sodium bicarb 1300 mg twice daily.  Dyslipidemia -Home meds include Aspirin 81 mg daily, WelChol 1875 mg twice daily. -Continue the same.   -Lipid panel 7/24 with HDL low at 20, LDL at 76.  Urinary retention -  Patient had a Foley catheter placed approximately a month  ago -He was instructed to follow-up for removal however appears that does not happen -Urinary retention likely secondary to diabetic neuropathy -Foley catheter replaced in the ED. -7/24, Foley catheter was removed.  Voiding trial attempted.  Patient has not successfully voided yet.  Required in and out catheter once last night, to try again today.  Depression Previous history of suicidal ideation -Denies suicidal ideation at this time.   -Home meds include Cogentin 0.5 mg twice daily, Cymbalta 30 mg daily, Haldol 0.5 mg daily. -Continue the same.  Chronic diarrhea -Continue Imodium 2 mg twice daily as needed.  Gastroparesis -Improving with Reglan.  Continue Protonix.  Chronic wounds -Wound care consulted.  Mobility: Encourage ambulation.  Needs PT eval Code Status:   Code Status: Full Code  Nutritional status: Body mass index is 15.2 kg/m. Nutrition Problem: Inadequate oral intake Etiology: social / environmental circumstances, poor appetite Signs/Symptoms: per patient/family report, percent weight loss Percent weight loss: 16.5 % (20 lbs x 3 weeks) Diet Order            Diet Carb Modified Fluid consistency: Thin; Room service appropriate? Yes  Diet effective now                 DVT prophylaxis: heparin injection 5,000 Units Start: 05/02/20 2200   Antimicrobials:  Not on antibiotics Fluid: Reduce normal saline 75 mill per hour.  Consultants: None Family Communication:  None at bedside  Status is: Inpatient  Remains inpatient appropriate because:Ongoing diagnostic testing needed not appropriate for outpatient work up and IV treatments appropriate due to intensity of illness or inability to take PO   Dispo: The patient is from: Home              Anticipated d/c is to -lives with his parents were not expecting him back.  Does not have medical insurance for SNF placement.              Anticipated d/c date is: 3 days              Patient currently is not medically  stable to d/c.   Infusions:  . sodium chloride 125 mL/hr at 05/04/20 0913    Scheduled Meds: . aspirin EC  81 mg Oral Daily  . benztropine  0.5 mg Oral BID  . Chlorhexidine Gluconate Cloth  6 each Topical Daily  . colesevelam  1,875 mg Oral BID WC  . DULoxetine  30 mg Oral Daily  . folic acid  1 mg Oral Daily  . haloperidol  0.5 mg Oral BID  . heparin  5,000 Units Subcutaneous Q8H  . insulin aspart  0-5 Units Subcutaneous QHS  . insulin aspart  0-9 Units Subcutaneous TID WC  . [START ON 05/05/2020] insulin glargine  15 Units Subcutaneous Daily  . multivitamin with minerals  1 tablet Oral Daily  . pantoprazole  40 mg Oral Daily  . Ensure Max Protein  11 oz Oral BID  . sodium bicarbonate  1,300 mg Oral BID  . thiamine  100 mg Oral Daily    Antimicrobials: Anti-infectives (From admission, onward)   Start     Dose/Rate Route Frequency Ordered Stop   05/02/20 1900  cefTRIAXone (ROCEPHIN) 1 g in sodium chloride 0.9 % 100 mL IVPB        1 g 200 mL/hr over 30 Minutes Intravenous  Once 05/02/20 1855 05/02/20 2059     PRN meds: acetaminophen **OR** acetaminophen, albuterol,  docusate sodium, hydrALAZINE, loperamide, metoCLOPramide (REGLAN) injection, ondansetron **OR** ondansetron (ZOFRAN) IV, oxyCODONE, traZODone   Objective: Vitals:   05/03/20 2348 05/04/20 0738  BP: 90/67 101/71  Pulse: 87 77  Resp:  13  Temp:  98.9 F (37.2 C)  SpO2:  100%    Intake/Output Summary (Last 24 hours) at 05/04/2020 1121 Last data filed at 05/04/2020 0957 Gross per 24 hour  Intake 3981.24 ml  Output 1625 ml  Net 2356.24 ml   Filed Weights   05/02/20 1550  Weight: 45.4 kg   Weight change:  Body mass index is 15.2 kg/m.   Physical Exam: General exam: Comfortable, calm.  Not in physical distress.  Thin built Skin: No rashes, lesions or ulcers. HEENT: Atraumatic, normocephalic, supple neck, no obvious bleeding.  Poor oral hygiene. Lungs: Clear to auscultate bilaterally CVS: Regular  rate and rhythm, no murmur GI/Abd soft, nontender, nondistended, bowel sound present CNS: Alert, awake, oriented x3 Psychiatry: Mood appropriate Extremities: No edema, no calf tenderness  Data Review: I have personally reviewed the laboratory data and studies available.  Recent Labs  Lab 05/02/20 1558 05/03/20 0407 05/04/20 0902  WBC 10.2 11.5* 15.8*  NEUTROABS 8.5*  --  13.3*  HGB 8.6* 8.6* 7.2*  HCT 25.6* 25.2* 22.3*  MCV 88.3 86.9 90.3  PLT 407* 370 361   Recent Labs  Lab 05/02/20 1558 05/03/20 0407 05/04/20 0902  NA 127* 131* 129*  K 4.3 4.0 3.5  CL 97* 103 105  CO2 21* 20* 18*  GLUCOSE 345* 306* 233*  BUN 59* 55* 41*  CREATININE 3.51* 2.89* 2.81*  CALCIUM 8.7* 8.2* 7.6*  MG  --   --  1.5*  PHOS  --   --  2.8   Signed, Terrilee Croak, MD [facility] Pager: [phone] (Secure Chat preferred). 05/04/2020

## 2020-05-04 NOTE — Plan of Care (Signed)
  Problem: Education: Goal: Knowledge of General Education information will improve Description Including pain rating scale, medication(s)/side effects and non-pharmacologic comfort measures Outcome: Progressing   

## 2020-05-04 NOTE — Progress Notes (Addendum)
Pt BP 90/67. NP made notified via txt page.

## 2020-05-04 NOTE — Progress Notes (Signed)
Foley removed at 530pm and pt have not voided since then. Bladder scan 743. [place] NP made aware. Order to in and out cath pt received.

## 2020-05-05 LAB — CBC WITH DIFFERENTIAL/PLATELET
Abs Immature Granulocytes: 0.05 10*3/uL (ref 0.00–0.07)
Basophils Absolute: 0 10*3/uL (ref 0.0–0.1)
Basophils Relative: 0 %
Eosinophils Absolute: 0 10*3/uL (ref 0.0–0.5)
Eosinophils Relative: 0 %
HCT: 22.5 % — ABNORMAL LOW (ref 39.0–52.0)
Hemoglobin: 7.4 g/dL — ABNORMAL LOW (ref 13.0–17.0)
Immature Granulocytes: 0 %
Lymphocytes Relative: 15 %
Lymphs Abs: 1.7 10*3/uL (ref 0.7–4.0)
MCH: 29.6 pg (ref 26.0–34.0)
MCHC: 32.9 g/dL (ref 30.0–36.0)
MCV: 90 fL (ref 80.0–100.0)
Monocytes Absolute: 0.5 10*3/uL (ref 0.1–1.0)
Monocytes Relative: 5 %
Neutro Abs: 9.4 10*3/uL — ABNORMAL HIGH (ref 1.7–7.7)
Neutrophils Relative %: 80 %
Platelets: 408 10*3/uL — ABNORMAL HIGH (ref 150–400)
RBC: 2.5 MIL/uL — ABNORMAL LOW (ref 4.22–5.81)
RDW: 13.2 % (ref 11.5–15.5)
WBC: 11.8 10*3/uL — ABNORMAL HIGH (ref 4.0–10.5)
nRBC: 0 % (ref 0.0–0.2)

## 2020-05-05 LAB — GLUCOSE, CAPILLARY
Glucose-Capillary: 76 mg/dL (ref 70–99)
Glucose-Capillary: 119 mg/dL — ABNORMAL HIGH (ref 70–99)
Glucose-Capillary: 160 mg/dL — ABNORMAL HIGH (ref 70–99)
Glucose-Capillary: 140 mg/dL — ABNORMAL HIGH (ref 70–99)

## 2020-05-05 LAB — BASIC METABOLIC PANEL
Anion gap: 5 (ref 5–15)
BUN: 39 mg/dL — ABNORMAL HIGH (ref 6–20)
CO2: 20 mmol/L — ABNORMAL LOW (ref 22–32)
Calcium: 7.2 mg/dL — ABNORMAL LOW (ref 8.9–10.3)
Chloride: 108 mmol/L (ref 98–111)
Creatinine, Ser: 2.7 mg/dL — ABNORMAL HIGH (ref 0.61–1.24)
GFR calc Af Amer: 32 mL/min — ABNORMAL LOW (ref >60)
GFR calc non Af Amer: 27 mL/min — ABNORMAL LOW (ref >60)
Glucose, Bld: 92 mg/dL (ref 70–99)
Potassium: 3.8 mmol/L (ref 3.5–5.1)
Sodium: 133 mmol/L — ABNORMAL LOW (ref 135–145)

## 2020-05-05 LAB — PHOSPHORUS: Phosphorus: 3 mg/dL (ref 2.5–4.6)

## 2020-05-05 LAB — MAGNESIUM: Magnesium: 1.6 mg/dL — ABNORMAL LOW (ref 1.7–2.4)

## 2020-05-05 MED ORDER — DEXTROSE 50 % IV SOLN: 25.0000 g | INTRAVENOUS | Status: AC

## 2020-05-05 MED ORDER — LEVOFLOXACIN 500 MG PO TABS
500.0000 mg | ORAL_TABLET | ORAL | Status: DC
  Administered 2020-05-05 – 2020-05-09 (×3): 500 mg via ORAL
  Filled 2020-05-05 (×2): qty 1

## 2020-05-05 MED ORDER — MAGNESIUM SULFATE 2 GM/50ML IV SOLN
2.0000 g | Freq: Once | INTRAVENOUS | Status: AC
  Administered 2020-05-05: 2 g via INTRAVENOUS
  Filled 2020-05-05: qty 50

## 2020-05-05 MED ORDER — INSULIN GLARGINE 100 UNIT/ML ~~LOC~~ SOLN
8.0000 [IU] | Freq: Every day | SUBCUTANEOUS | Status: DC
  Administered 2020-05-05 – 2020-05-13 (×10): 8 [IU] via SUBCUTANEOUS
  Filled 2020-05-05 (×10): qty 0.08

## 2020-05-05 NOTE — Progress Notes (Addendum)
Inpatient Diabetes Program Recommendations  AACE/ADA: New Consensus Statement on Inpatient Glycemic Control   Target Ranges:  Prepandial:   less than 140 mg/dL      Peak postprandial:   less than 180 mg/dL (1-2 hours)      Critically ill patients:  140 - 180 mg/dL   Results for Gregory Moody, Gregory Moody (MRN [id number]) as of 05/05/2020 07:34  Ref. Range 05/04/2020 07:40 05/04/2020 11:38 05/04/2020 16:18 05/04/2020 22:19 05/04/2020 23:44 05/05/2020 07:20  Glucose-Capillary Latest Ref Range: 70 - 99 mg/dL 189 (H) 216 (H) 154 (H) 48 (L) 169 (H) 76   Review of Glycemic Control   Outpatient Diabetes medications: Novolin 70/30 30-50 units BID Current orders for Inpatient glycemic control: Lantus 15 units daily, Novolog 0-9 units TID with meals, Novolog 0-5 units QHS  Inpatient Diabetes Program Recommendations:    Insulin-Basal: Patient received Lantus 10 units on 05/04/20 and fasting glucose 76 mg/dl today. Please consider decreasing Lantus to 8 units daily.  Insulin-Meal Coverage: If patient is eating consistently, would benefit from meal coverage (such as Novolog 2 units TID with meals if patient eats at least 50% of meals).  Outpatient DM medications: At time of discharge, may want to consider switching patient to different insulin regimen in which he could still take basal insulin even when he is not able to eat.  Would benefit from switching to basal/bolus insulin regimen.  Addendum 05/05/20@13 :42-Spoke with patient using [employer] device [name] [phone]). Patient reports that he is going to clinic to get care and medications ([facility]). Patient states that he is using Novolin 70-30 insulin outpatient and taking 30-50 units with meals. Patient reports that if he skips a meal, then he skips taking insulin to prevent from going too low. Patient frequently skips meals and not able to eat.  Patient states that he was not symptomatic with glucose of 76 mg/dl this morning but can usually  gets symptoms of hypoglycemia if glucose gets less than 60-70 mg/dl. Patient reports that his glucose can fluctuate from low to high (200-300's). Discussed 70/30 insulin in detail and explained how it works. Explained that if he is skipping doses then the prior dose is wearing off before he is taking the next dose and that causes glucose to increase more.  Explained that he may need to switch to a basal/bolus insulin regimen as he likely needs to be consistently taking basal insulin to prevent hyperglycemia and taking short acting insulin to cover meals and correction. Patient states that he would be interested in taking different insulin if needed as long as he was able to get it in insulin pens (which is easier for him to use) from [facility] Patient states that he needs test strips for his glucometer and insulin pen needles. Will ask MD to provide Rx for both at time of discharge. Discussed importance of DM control and preventing hyperglycemia as well as hypoglycemia. Encouraged patient to take insulin as prescribed and follow up with PCP. Patient verbalized understanding of information discussed and states that he has no questions at this time.  Thanks, Barnie Alderman, RN, MSN, CDE Diabetes Coordinator Inpatient Diabetes Program [phone] (Team Pager from 8am to 5pm)

## 2020-05-05 NOTE — Progress Notes (Signed)
PROGRESS NOTE  Gregory Moody  DOB: 05/04/1976  PCP: [facility] QIW:[id number]  DOA: 05/02/2020  LOS: 2 days   Chief Complaint  Patient presents with  . Weakness  . Hypotension   Brief narrative: Gregory Moody is a 44 y.o. male with PMH significant for diabetes mellitus, gastroparesis, CKD stage 4, chronic anemia, tuberculosis, COVID-19 infection July 2020, chronic diarrhea, urinary retention, unintentional weight loss, severe protein calorie malnutrition and recurrent admissions for uncontrolled diabetes as well as a previous admission to behavioral health unit due to poor self-care.  Patient presented to the ED on 7/23  for evaluation of weakness and dizziness and inability to tolerate p.o and reports being unable to care for himself.  He lives with his mother, denies any active alcohol or drug use.   In the ED, patient was afebrile, heart rate in 60s, initial blood pressure was as low at 67/47 which improved with normal saline bolus. Oxygen saturation 100% room air. Weak and emaciated, BMI 15 Labs with sodium 127, BUN/creatinine 59/3.51, blood glucose related to 345, hemoglobin low at 8.6 Urinalysis with hazy straw-colored urine with rare bacteria, moderate amount of hemoglobin and leukocytes.  Also, on previous admission (6/23-6/30), he had acute urinary retention and had a Foley catheter placed. Patient however did not follow-up with urology as an outpatient and continue to have catheter in place. He states that he has had leakage of urine and blood products around his urinary catheter. Given his severe weakness and recurrent admissions will place in observation status and hospital service to follow.  Subjective: Patient was seen and examined this morning.  Urinary retention more than 900 mL straight cath last night, Foley catheter was not replaced after initial removal. Poor historian. Denies any nausea vomiting. He was able to eat some. Still has  some loose stool 2-3 times a day. His penile shaft is draining and there is some pus expression from the shaft. "I need social services"   Assessment/Plan: Generalized weakness, failure to thrive, Severe protein calorie malnutrition -Multifactorial: Poor oral intake, gastroparesis, uncontrolled diabetes, chronic diarrhea, medication nonadherence, poor self-care -albumin level low at 2.8. -Education officer, museum, diabetes coordinator and dietitian involved. -PT/OT consult placed.  Insulin-dependent type II diabetes mellitus, Uncontrolled hyperglycemia -A1c 8.3 on 7/23. -Blood sugar level to 345 on presentation. -Only on sliding scale insulin at home. Started on Lantus and sliding scale insulin. Will receive Lantus 10 units yesterday morning. Blood sugars less than 80 today morning. Decrease to Lantus 8 units daily and also continue sliding scale insulin.  Hyponatremia -Sodium level was low at 127 at presentation.   -Probably hypovolemic hyponatremia also with low solute intake and partly secondary to elevated blood glucose. -Continue isotonic fluid. Levels are appropriately improving with improvement of blood sugars.  Chronic anemia, anemia of chronic disease. -Hemoglobin low at 8.6 at presentation.  Baseline with 9-10. -No active bleeding but hemoglobin down to 7.2 today. -Continue to monitor.  Transfuse if less than 7.  Chronic kidney disease stage IV -Patient's creatinine appears to range between 3.35 and 5 -Presented with creatinine of 3.51,  -With IV hydration, creatinine improving. -Continue sodium bicarb 1300 mg twice daily.  Dyslipidemia -Home meds include Aspirin 81 mg daily, WelChol 1875 mg twice daily. -Continue the same.   -Lipid panel 7/24 with HDL low at 20, LDL at 76.  Urinary retention, penile shaft abscess? No history of traumatic instrumentation. -Patient had a Foley catheter placed approximately a month ago -He was instructed to follow-up for removal  however appears  that did not happen -Urinary retention likely secondary to diabetic neuropathy, immobility. -Foley catheter replaced in the ED on admission. -7/24, Foley catheter was removed.  Voiding trial attempted. Still retaining. Encourage voiding. Bladder scan, if still retaining, will put Foley catheter back. -Physical exam reveals abscess on the penile shaft that is draining, sent for cultures. -Consult sent for urology to follow-up for both abscess and urinary retention.  Depression with Previous history of suicidal ideation -Denies suicidal ideation at this time.   -Home meds include Cogentin 0.5 mg twice daily, Cymbalta 30 mg daily, Haldol 0.5 mg daily. -Continue the same.  Chronic diarrhea -Continue Imodium 2 mg twice daily as needed.  Gastroparesis -Improving with Reglan.  Continue Protonix.  Chronic wounds -Wound care consulted.  Mobility: Encourage ambulation. Continue to work with PT OT. Code Status:   Code Status: Full Code  Nutritional status: Body mass index is 15.2 kg/m. Nutrition Problem: Inadequate oral intake Etiology: social / environmental circumstances, poor appetite Signs/Symptoms: per patient/family report, percent weight loss Percent weight loss: 16.5 % (20 lbs x 3 weeks) Diet Order            Diet Carb Modified Fluid consistency: Thin; Room service appropriate? Yes  Diet effective now                 DVT prophylaxis: heparin injection 5,000 Units Start: 05/02/20 2200   Antimicrobials:  Not on antibiotics Fluid: On normal saline.  Consultants: Urology. Family Communication:  None at bedside  Status is: Inpatient  Remains inpatient appropriate because:Ongoing diagnostic testing needed not appropriate for outpatient work up and IV treatments appropriate due to intensity of illness or inability to take PO   Dispo: The patient is from: Home              Anticipated d/c is to -lives with his parents were not expecting him back.  Does not have medical  insurance for SNF placement.              Anticipated d/c date is: 3 days              Patient currently is not medically stable to d/c.   Infusions:  . sodium chloride 125 mL/hr at 05/05/20 0837    Scheduled Meds: . aspirin EC  81 mg Oral Daily  . benztropine  0.5 mg Oral BID  . Chlorhexidine Gluconate Cloth  6 each Topical Daily  . colesevelam  1,875 mg Oral BID WC  . dextrose  25 g Intravenous STAT  . DULoxetine  30 mg Oral Daily  . folic acid  1 mg Oral Daily  . haloperidol  0.5 mg Oral BID  . heparin  5,000 Units Subcutaneous Q8H  . insulin aspart  0-5 Units Subcutaneous QHS  . insulin aspart  0-9 Units Subcutaneous TID WC  . insulin glargine  8 Units Subcutaneous Daily  . multivitamin with minerals  1 tablet Oral Daily  . pantoprazole  40 mg Oral Daily  . Ensure Max Protein  11 oz Oral BID  . sodium bicarbonate  1,300 mg Oral BID  . thiamine  100 mg Oral Daily    Antimicrobials: Anti-infectives (From admission, onward)   Start     Dose/Rate Route Frequency Ordered Stop   05/02/20 1900  cefTRIAXone (ROCEPHIN) 1 g in sodium chloride 0.9 % 100 mL IVPB        1 g 200 mL/hr over 30 Minutes Intravenous  Once 05/02/20 1855 05/02/20 2059  PRN meds: acetaminophen **OR** acetaminophen, albuterol, docusate sodium, hydrALAZINE, loperamide, metoCLOPramide (REGLAN) injection, ondansetron **OR** ondansetron (ZOFRAN) IV, oxyCODONE, traZODone   Objective: Vitals:   05/04/20 2351 05/05/20 0717  BP: 112/83 (!) 117/86  Pulse: 89 75  Resp:  15  Temp: 99 F (37.2 C) 98.6 F (37 C)  SpO2: 100% 100%    Intake/Output Summary (Last 24 hours) at 05/05/2020 1232 Last data filed at 05/05/2020 0230 Gross per 24 hour  Intake 240 ml  Output 1801 ml  Net -1561 ml   Filed Weights   05/02/20 1550  Weight: 45.4 kg   Weight change:  Body mass index is 15.2 kg/m.   Physical Exam: General exam: Comfortable, calm.  Not in physical distress.  Thin built Skin: No rashes, lesions or  ulcers. HEENT: Atraumatic, normocephalic, supple neck, no obvious bleeding.  Poor oral hygiene. Lungs: Clear to auscultate bilaterally CVS: Regular rate and rhythm, no murmur GI/Abd soft, nontender, nondistended, bowel sound present CNS: Alert, awake, oriented x3 Psychiatry: Mood appropriate Extremities: No edema, no calf tenderness Penis: Edematous, some fluctuation along the ventral shaft, pussy materials draining with opening at the ventral surface.  Data Review: I have personally reviewed the laboratory data and studies available.  Recent Labs  Lab 05/02/20 1558 05/03/20 0407 05/04/20 0902 05/04/20 2259 05/05/20 0453  WBC 10.2 11.5* 15.8* 13.5* 11.8*  NEUTROABS 8.5*  --  13.3*  --  9.4*  HGB 8.6* 8.6* 7.2* 7.2* 7.4*  HCT 25.6* 25.2* 22.3* 22.3* 22.5*  MCV 88.3 86.9 90.3 90.3 90.0  PLT 407* 370 361 375 408*   Recent Labs  Lab 05/02/20 1558 05/03/20 0407 05/04/20 0902 05/05/20 0453  NA 127* 131* 129* 133*  K 4.3 4.0 3.5 3.8  CL 97* 103 105 108  CO2 21* 20* 18* 20*  GLUCOSE 345* 306* 233* 92  BUN 59* 55* 41* 39*  CREATININE 3.51* 2.89* 2.81* 2.70*  CALCIUM 8.7* 8.2* 7.6* 7.2*  MG  --   --  1.5* 1.6*  PHOS  --   --  2.8 3.0   Signed, Barb Merino, MD [facility] 05/05/2020   Total time spent: 30 minutes

## 2020-05-05 NOTE — Consult Note (Signed)
[facility] Nurse Consult Note: Reason for Consult: Wound care. Urology has been consulted and both Dr. Erlene Quan and the Urologic PA S. Vaillancourt have examined the patient and have provided Nursing with guidance for a dry dressing to the penile wounds to promote drainage. Wound type: Infectious Pressure Injury POA: N/A  I will relay that guidance for Nursing.  Urology to follow.  No additional needs for [place] Nurse at this time.  [facility] nursing team will not follow, but will remain available to this patient, the nursing and medical teams.  Please re-consult if needed. Thanks, Maudie Flakes, MSN, RN, [place], Arther Abbott  Pager# [phone]

## 2020-05-05 NOTE — Progress Notes (Signed)
Pt has been unable to void, bladder scan at 751ml. Pt. output with urinary cath 919ml. Penis noted to have 2 areas of breakdown with large amount of purulent, foul-smelling drainage. Difficult to catheterize.

## 2020-05-05 NOTE — Consult Note (Signed)
WOC Nurse Consult Note: Attempted to see patient x2 for wound assessment.  Urology in to see patient with interpreter for extended visit.  Will see later today or tomorrow.  Thanks, Maudie Flakes, MSN, RN, [place], Arther Abbott  Pager# [phone]

## 2020-05-05 NOTE — Consult Note (Signed)
Urology Consult  I have been asked to see the patient by Dr. Sloan Leiter, for evaluation and management of recurrent urinary retention and penile abscess.  Chief Complaint: Weakness  History of Present Illness: Gregory Moody is a 44 y.o. year old comorbid male with poorly controlled DM1 with multiple admissions for DKA, CKD, urinary retention with AKI in the setting of discontinued Foley catheters, and poor self-care who presented to the ED three days ago with reports of weakness.  Admission labs notable for WBC count 10.2, creatinine 3.51, lactate 1.5. UA with 6-10 RBCs/hpf, 21-50 WBCs/hpf, and rare bacteria. Blood cultures pending with no growth at 3 days. He received antibiotics as below.  I consulted on him one month ago during a prior admission featuring recurrent urinary retention in the setting of discontinued Foley catheter (see notes dated 6/24 and 6/25). Patient attempted self-catheterization at that time but ultimately elected for chronic indwelling Foley for management of his likely neurogenic bladder, placed on 6/25. He is scheduled for routine 1 month catheter exchange with me in 2 days. Patient was noted to have severe phimosis at that time.  Foley was removed yesterday morning; patient was unable to void thereafter. He has required I&O catheterization twice and nursing has noted two areas of "breakdown with large amount of purulent, foul-smelling drainage," on the underside of the penis.  WBC count increased following admission, now downtrending at 11.8. Creatinine stable at 2.70.  Renal US dated 03/26/2020 revealed markedly increased parenchymal echogenicity consistent with medical renal disease without evidence of renal mass or hydronephrosis. He was also noted to have diffuse bladder wall thickening. CT AP noncon dated 01/21/2020 also with no evidence of renal mass, calculi, or hydronephrosis.  Today he reports no lower abdominal pain leading up to his recent admission. He  does state he had some penile pain associated with his infection, but he is unclear on when this began.  Anti-infectives (From admission, onward)   Start     Dose/Rate Route Frequency Ordered Stop   05/02/20 1900  cefTRIAXone (ROCEPHIN) 1 g in sodium chloride 0.9 % 100 mL IVPB        1 g 200 mL/hr over 30 Minutes Intravenous  Once 05/02/20 1855 05/02/20 2059      Past Medical History:  Diagnosis Date  . COVID-19   . Diabetes mellitus without complication ([place])   . Gastroparesis   . Tuberculosis     Past Surgical History:  Procedure Laterality Date  . COLONOSCOPY N/A 01/25/2020   Procedure: COLONOSCOPY;  Surgeon: Toledo, Benay Pike, MD;  Location: [facility] ENDOSCOPY;  Service: Gastroenterology;  Laterality: N/A;  . ESOPHAGOGASTRODUODENOSCOPY N/A 02/03/2019   Procedure: ESOPHAGOGASTRODUODENOSCOPY (EGD);  Surgeon: Toledo, Benay Pike, MD;  Location: [facility] ENDOSCOPY;  Service: Gastroenterology;  Laterality: N/A;    Home Medications:  Current Meds  Medication Sig  . aspirin EC 81 MG tablet Take 81 mg by mouth daily.  . benztropine (COGENTIN) 0.5 MG tablet Take 1 tablet (0.5 mg total) by mouth 2 (two) times daily.  . colesevelam (WELCHOL) 625 MG tablet Take 3 tablets (1,875 mg total) by mouth 2 (two) times daily with a meal.  . docusate sodium (COLACE) 100 MG capsule Take 1 capsule (100 mg total) by mouth 2 (two) times daily as needed for mild constipation.  . DULoxetine (CYMBALTA) 30 MG capsule Take 1 capsule (30 mg total) by mouth daily.  . haloperidol (HALDOL) 0.5 MG tablet Take 1 tablet (0.5 mg total) by mouth 2 (two) times daily.  [name]  insulin aspart (NOVOLOG) 100 UNIT/ML injection Inject 0-9 Units into the skin 3 (three) times daily before meals. 3 times daily before meals and at bedtime  . loperamide (IMODIUM) 2 MG capsule Take 1 capsule (2 mg total) by mouth 2 (two) times daily as needed for diarrhea or loose stools.  [name] NEXIUM 40 MG capsule Take 40 mg by mouth daily.  . pantoprazole  (PROTONIX) 40 MG tablet Take 1 tablet (40 mg total) by mouth daily.  . sodium bicarbonate 650 MG tablet Take 2 tablets (1,300 mg total) by mouth 2 (two) times daily.    Allergies: No Known Allergies  Family History  Problem Relation Age of Onset  . Diabetes Mother   . Diabetes Father     Social History:  reports that he has been smoking cigars. He has never used smokeless tobacco. He reports that he does not drink alcohol and does not use drugs.  ROS: A complete review of systems was performed.  All systems are negative except for pertinent findings as noted.  Physical Exam:  Vital signs in last 24 hours: Temp:  [98.6 F (37 C)-99 F (37.2 C)] 98.6 F (37 C) (07/26 0717) Pulse Rate:  [75-90] 75 (07/26 0717) Resp:  [15-18] 15 (07/26 0717) BP: (112-117)/(82-86) 117/86 (07/26 0717) SpO2:  [100 %] 100 % (07/26 0717) Constitutional:  Alert and oriented, no acute distress, cachectic HEENT: [place] AT, moist mucus membranes Cardiovascular: No clubbing, cyanosis, or edema. Respiratory: Normal respiratory effort GU: Distal glans penis exposed. Diffuse penile edema noted. There are two approximate 0.5cm diameter openings proximal to the ventral corona leaking frankly purulent material. No areas of fluctuance of the penis noted. Bilateral testicular tenderness without fluctuance or crepitus. Skin: No rashes, bruises or suspicious lesions Neurologic: Grossly intact, no focal deficits, moving all 4 extremities Psychiatric: Normal mood and affect  Laboratory Data:  Recent Labs    05/04/20 0902 05/04/20 2259 05/05/20 0453  WBC 15.8* 13.5* 11.8*  HGB 7.2* 7.2* 7.4*  HCT 22.3* 22.3* 22.5*   Recent Labs    05/03/20 0407 05/04/20 0902 05/05/20 0453  NA 131* 129* 133*  K 4.0 3.5 3.8  CL 103 105 108  CO2 20* 18* 20*  GLUCOSE 306* 233* 92  BUN 55* 41* 39*  CREATININE 2.89* 2.81* 2.70*  CALCIUM 8.2* 7.6* 7.2*   Urinalysis    Component Value Date/Time   COLORURINE STRAW (A)  05/02/2020 2005   APPEARANCEUR HAZY (A) 05/02/2020 2005   LABSPEC 1.005 05/02/2020 2005   PHURINE 5.0 05/02/2020 2005   GLUCOSEU >=500 (A) 05/02/2020 2005   HGBUR MODERATE (A) 05/02/2020 2005   BILIRUBINUR NEGATIVE 05/02/2020 2005   [place] 05/02/2020 2005   PROTEINUR NEGATIVE 05/02/2020 2005   NITRITE NEGATIVE 05/02/2020 2005   LEUKOCYTESUR MODERATE (A) 05/02/2020 2005   Results for orders placed or performed during the hospital encounter of 05/02/20  Blood Culture (routine x 2)     Status: None (Preliminary result)   Collection Time: 05/02/20  3:58 PM   Specimen: BLOOD  Result Value Ref Range Status   Specimen Description BLOOD LAC  Final   Special Requests   Final    BOTTLES DRAWN AEROBIC AND ANAEROBIC Blood Culture adequate volume   Culture   Final    NO GROWTH 3 DAYS Performed at [facility], [address] [place], [place] [address]    Report Status PENDING  Incomplete  Blood Culture (routine x 2)     Status: None (Preliminary result)  Collection Time: 05/02/20  3:59 PM   Specimen: BLOOD  Result Value Ref Range Status   Specimen Description BLOOD RAC  Final   Special Requests   Final    BOTTLES DRAWN AEROBIC AND ANAEROBIC Blood Culture results may not be optimal due to an excessive volume of blood received in culture bottles   Culture   Final    NO GROWTH 3 DAYS Performed at [facility], [address] [place], [place] [address]    Report Status PENDING  Incomplete  SARS Coronavirus 2 by RT PCR (hospital order, performed in [facility] hospital lab) Nasopharyngeal Nasopharyngeal Swab     Status: None   Collection Time: 05/02/20  6:00 PM   Specimen: Nasopharyngeal Swab  Result Value Ref Range Status   SARS Coronavirus 2 NEGATIVE NEGATIVE Final    Comment: (NOTE) SARS-CoV-2 target nucleic acids are NOT DETECTED.  The SARS-CoV-2 RNA is generally detectable in upper and lower respiratory specimens during the acute phase of infection.  The lowest concentration of SARS-CoV-2 viral copies this assay can detect is 250 copies / mL. A negative result does not preclude SARS-CoV-2 infection and should not be used as the sole basis for treatment or other patient management decisions.  A negative result may occur with improper specimen collection / handling, submission of specimen other than nasopharyngeal swab, presence of viral mutation(s) within the areas targeted by this assay, and inadequate number of viral copies (<250 copies / mL). A negative result must be combined with clinical observations, patient history, and epidemiological information.  Fact Sheet for Patients:   StrictlyIdeas.no  Fact Sheet for Healthcare Providers: BankingDealers.co.za  This test is not yet approved or  cleared by the [place] FDA and has been authorized for detection and/or diagnosis of SARS-CoV-2 by FDA under an Emergency Use Authorization (EUA).  This EUA will remain in effect (meaning this test can be used) for the duration of the COVID-19 declaration under Section 564(b)(1) of the Act, 21 U.S.C. section 360bbb-3(b)(1), unless the authorization is terminated or revoked sooner.  Performed at [facility], [address] [place], [place] [address]    Assessment & Plan:  44 year old comorbid male with likely neurogenic bladder secondary to poorly managed DM1 admitted with generalized weakness and found to have a ventral penile abscess without signs of Fournier's gangrene.  Foley catheter removed with subsequent inability to urinate requiring I&O catheterization.  Patient with purulent, draining infection of the ventral aspect of the penis as well as possible paraphimosis; please see addendum from Dr. Erlene Quan for further information. Recommend empiric antibiotics with skin flora coverage for management. Do not replace Foley at this time due to concurrent infection. Recommend  continued I&O catheterization 2-3 times daily pending suprapubic catheter placement with IR.  Additionally, I do not expect the patient to regain the ability to urinate. To the contrary, his neurogenic bladder is likely to worsen in the setting of his uncontrolled diabetes. No further voiding trials necessary. Long-term, recommend chronic indwelling Foley versus suprapubic catheter for management.  Recommendations: -Empiric antibiotics  -I&O catheterization 2-3 times daily -Will arrange SPT placement with IR, ideally tomorrow -Daily wound checks  Thank you for involving me in this patient's care, I will continue to follow along.  Debroah Loop, PA-C 05/05/2020 12:12 PM

## 2020-05-05 NOTE — Progress Notes (Addendum)
Physical Therapy Treatment Patient Details Name: Gregory Moody MRN: [id number] DOB: 06-29-1976 Today's Date: 05/05/2020    History of Present Illness Gregory Moody is a 44 y.o. male with medical history significant of diabetes mellitus, GERD, gastroparesis, DKA, anemia, tobacco abuse, esophageal candidiasis, CKD, malnutrition COVID-19 infection 04/23/2019, C. difficile colitis, and multiple admissions to the ED as well as the the behavioral health unit this year who presents for weakness and dizziness.    PT Comments    Patient generally disinterested in participation with session this date, but agreeable with max encouragement.  Was able to complete supine/sit, sit/stand and static stance with RW, min assist throughout. Further activity limited by orthostatic hypotension with transition to upright (see snip below).  RN/MD informed and aware.  Will continue to assess/progress in subsequent sessions as appropriate.  Language line interpreter (ID [phone]) utilized for communication assistance throughout session.     Follow Up Recommendations  SNF     Equipment Recommendations       Recommendations for Other Services       Precautions / Restrictions Precautions Precautions: Fall Restrictions Weight Bearing Restrictions: No    Mobility  Bed Mobility Overal bed mobility: Needs Assistance Bed Mobility: Supine to Sit;Sit to Supine     Supine to sit: Min guard;Min assist Sit to supine: Min guard      Transfers Overall transfer level: Needs assistance Equipment used: Rolling walker (2 wheeled) Transfers: Sit to/from Stand Sit to Stand: Min assist         General transfer comment: max encouratement required for participation  Ambulation/Gait             General Gait Details: deferred secondary to symptomatic orthostasis   Stairs             Wheelchair Mobility    Modified Rankin (Stroke Patients Only)       Balance Overall balance  assessment: Needs assistance Sitting-balance support: No upper extremity supported;Feet supported Sitting balance-Leahy Scale: Fair     Standing balance support: Bilateral upper extremity supported Standing balance-Leahy Scale: Poor                              Cognition Arousal/Alertness: Awake/alert Behavior During Therapy: Flat affect                                   General Comments: generally disinterested in session; max encouragement throughout session      Exercises Other Exercises Other Exercises: Supine LE therex, 1x10, act ROM: ankle pumps, heel slides, SLR; contralateral UE reaching with core rotation.  Max encouragement for slow, controlled and meaningful participation with therex.    General Comments        Pertinent Vitals/Pain Pain Assessment: No/denies pain    Home Living                      Prior Function            PT Goals (current goals can now be found in the care plan section) Acute Rehab PT Goals Patient Stated Goal: to get stronger and have a place to go when I leave the hospital PT Goal Formulation: With patient Time For Goal Achievement: 05/17/20 Potential to Achieve Goals: Fair Progress towards PT goals: Progressing toward goals    Frequency    Min 2X/week  PT Plan Current plan remains appropriate    Co-evaluation              AM-PAC PT "6 Clicks" Mobility   Outcome Measure  Help needed turning from your back to your side while in a flat bed without using bedrails?: None Help needed moving from lying on your back to sitting on the side of a flat bed without using bedrails?: A Little Help needed moving to and from a bed to a chair (including a wheelchair)?: A Little Help needed standing up from a chair using your arms (e.g., wheelchair or bedside chair)?: A Little Help needed to walk in hospital room?: Total Help needed climbing 3-5 steps with a railing? : Total 6 Click Score:  15    End of Session Equipment Utilized During Treatment: Gait belt Activity Tolerance: Treatment limited secondary to medical complications (Comment) (symptomatic orthostasis) Patient left: in bed;with call bell/phone within reach;with bed alarm set Nurse Communication: Mobility status PT Visit Diagnosis: Unsteadiness on feet (R26.81);Other abnormalities of gait and mobility (R26.89);Repeated falls (R29.6);Muscle weakness (generalized) (M62.81);History of falling (Z91.81);Difficulty in walking, not elsewhere classified (R26.2);Adult, failure to thrive (R62.7)     Time: 6244-6950 PT Time Calculation (min) (ACUTE ONLY): 26 min  Charges:  $Therapeutic Exercise: 8-22 mins $Therapeutic Activity: 8-22 mins                     Tudor Chandley H. Owens Shark, PT, DPT, NCS 05/05/20, 3:31 PM [phone]

## 2020-05-06 ENCOUNTER — Inpatient Hospital Stay: Payer: Medicaid Other

## 2020-05-06 DIAGNOSIS — N4821 Abscess of corpus cavernosum and penis: Secondary | ICD-10-CM | POA: Diagnosis present

## 2020-05-06 LAB — CBC WITH DIFFERENTIAL/PLATELET
Abs Immature Granulocytes: 0.02 10*3/uL (ref 0.00–0.07)
Basophils Absolute: 0 10*3/uL (ref 0.0–0.1)
Basophils Relative: 1 %
Eosinophils Absolute: 0.1 10*3/uL (ref 0.0–0.5)
Eosinophils Relative: 1 %
HCT: 23.8 % — ABNORMAL LOW (ref 39.0–52.0)
Hemoglobin: 8.1 g/dL — ABNORMAL LOW (ref 13.0–17.0)
Immature Granulocytes: 0 %
Lymphocytes Relative: 22 %
Lymphs Abs: 1.9 10*3/uL (ref 0.7–4.0)
MCH: 29.3 pg (ref 26.0–34.0)
MCHC: 34 g/dL (ref 30.0–36.0)
MCV: 86.2 fL (ref 80.0–100.0)
Monocytes Absolute: 0.5 10*3/uL (ref 0.1–1.0)
Monocytes Relative: 6 %
Neutro Abs: 6.2 10*3/uL (ref 1.7–7.7)
Neutrophils Relative %: 70 %
Platelets: 410 10*3/uL — ABNORMAL HIGH (ref 150–400)
RBC: 2.76 MIL/uL — ABNORMAL LOW (ref 4.22–5.81)
RDW: 13.2 % (ref 11.5–15.5)
WBC: 8.7 10*3/uL (ref 4.0–10.5)
nRBC: 0 % (ref 0.0–0.2)

## 2020-05-06 LAB — GLUCOSE, CAPILLARY
Glucose-Capillary: 82 mg/dL (ref 70–99)
Glucose-Capillary: 155 mg/dL — ABNORMAL HIGH (ref 70–99)
Glucose-Capillary: 153 mg/dL — ABNORMAL HIGH (ref 70–99)
Glucose-Capillary: 176 mg/dL — ABNORMAL HIGH (ref 70–99)

## 2020-05-06 LAB — BASIC METABOLIC PANEL
Anion gap: 4 — ABNORMAL LOW (ref 5–15)
BUN: 36 mg/dL — ABNORMAL HIGH (ref 6–20)
CO2: 18 mmol/L — ABNORMAL LOW (ref 22–32)
Calcium: 7.5 mg/dL — ABNORMAL LOW (ref 8.9–10.3)
Chloride: 112 mmol/L — ABNORMAL HIGH (ref 98–111)
Creatinine, Ser: 2.59 mg/dL — ABNORMAL HIGH (ref 0.61–1.24)
GFR calc Af Amer: 33 mL/min — ABNORMAL LOW (ref >60)
GFR calc non Af Amer: 29 mL/min — ABNORMAL LOW (ref >60)
Glucose, Bld: 90 mg/dL (ref 70–99)
Potassium: 3.9 mmol/L (ref 3.5–5.1)
Sodium: 134 mmol/L — ABNORMAL LOW (ref 135–145)

## 2020-05-06 LAB — APTT: aPTT: 46 seconds — ABNORMAL HIGH (ref 24–36)

## 2020-05-06 LAB — PROTIME-INR
INR: 1.1 (ref 0.8–1.2)
Prothrombin Time: 13.9 seconds (ref 11.4–15.2)

## 2020-05-06 IMAGING — US US INTRAOPERATIVE - NO REPORT
1 series · 3 of 3 positions shown · non-contrast
Comparison: None

INDICATION: History of neurogenic bladder, previously managed with in and out
Foley catheterization, however now with penile abscess and as such
request made for placement of a image guided suprapubic catheter for
urinary diversion purposes.

EXAM:
ULTRASOUND AND CT GUIDED SUPRAPUBIC CATHETER PLACEMENT

[Series 1: us intraoperative · 3 of 3 slices shown]
[im 1/3]
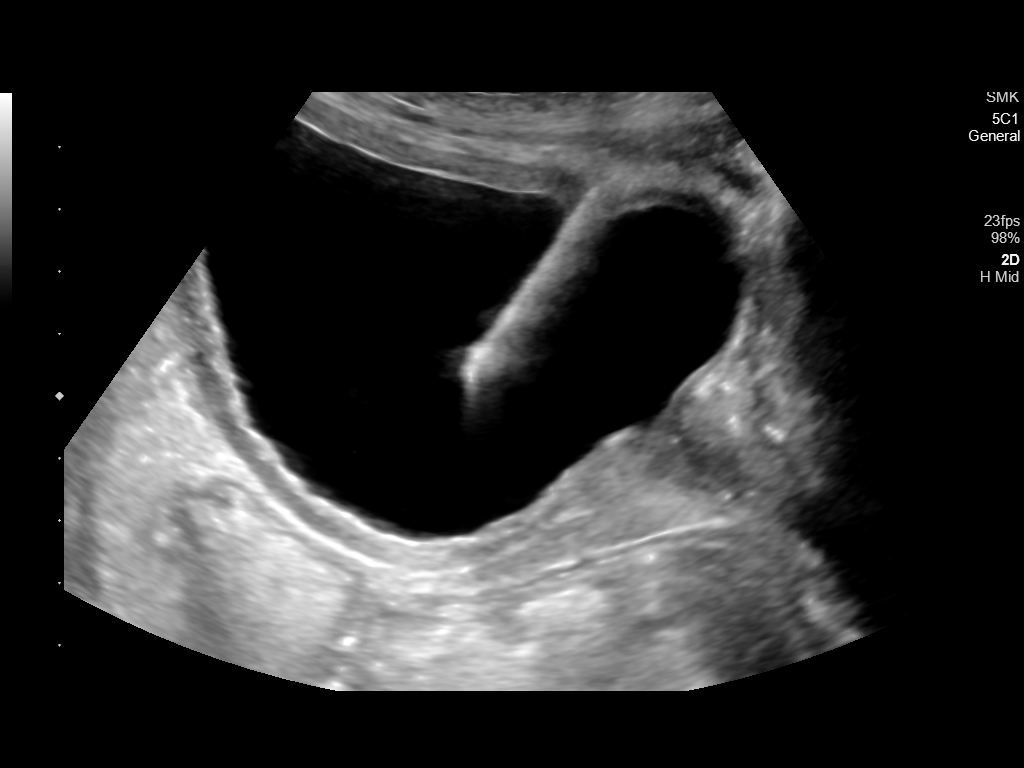
[im 2/3]
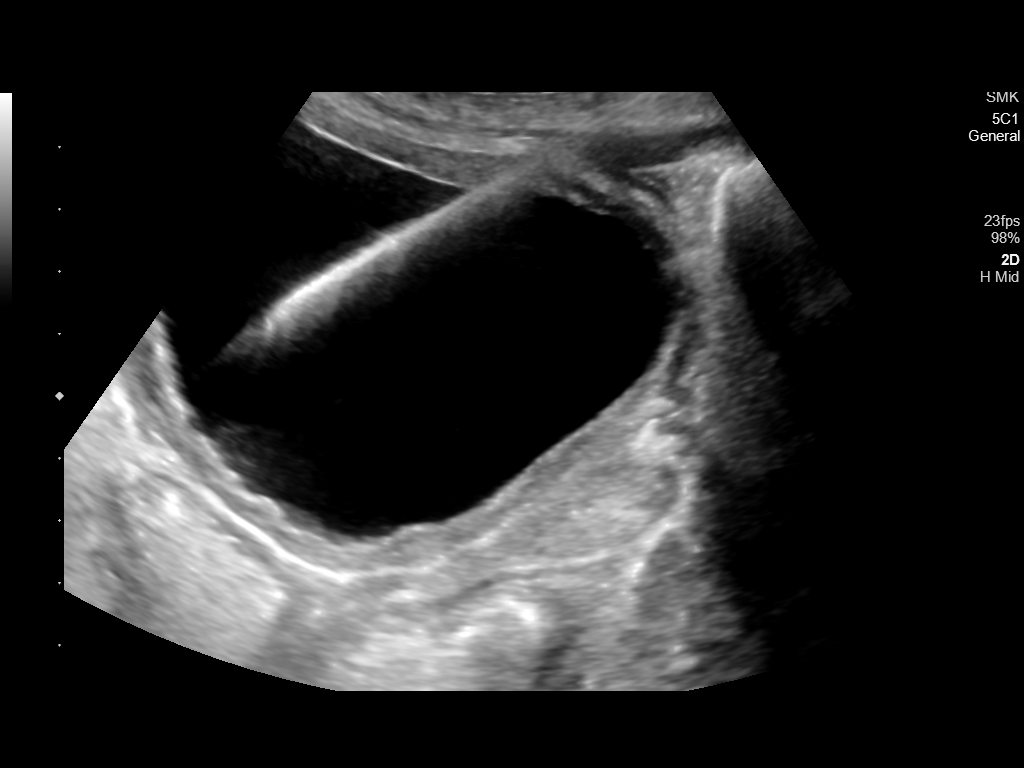
[im 3/3]
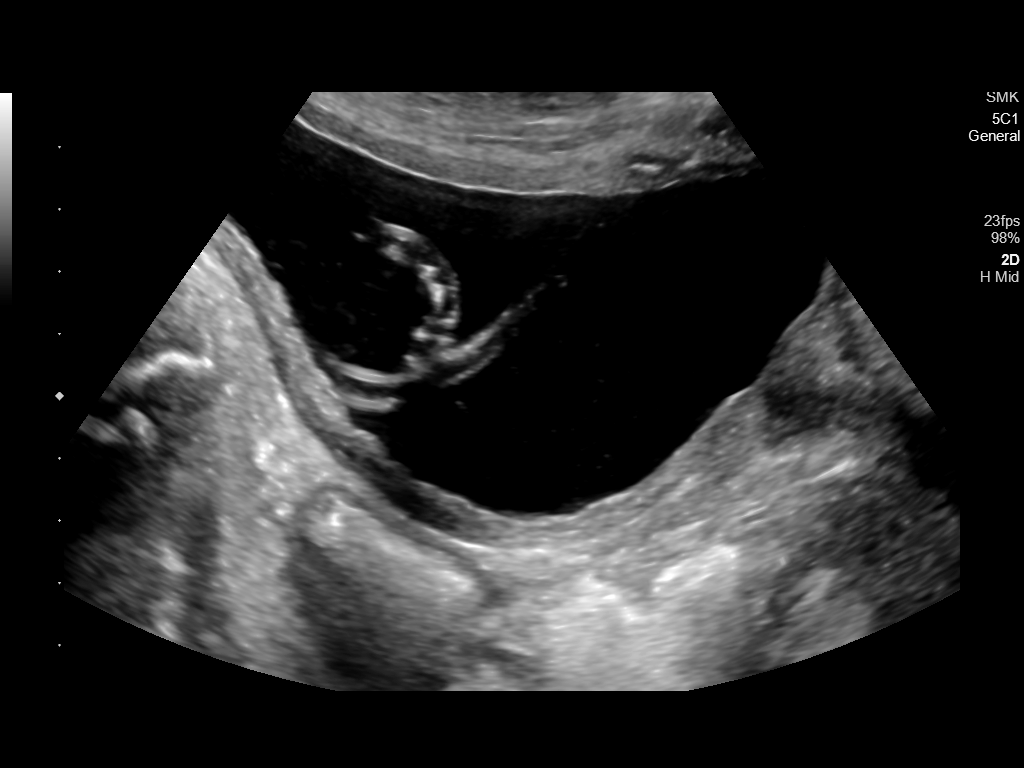

[3 of 3 positions shown; findings below may reference images not displayed]

MEDICATIONS:
None, the patient is currently admitted to the hospital receiving
intravenous antibiotics. The antibiotics were administered with an
appropriate time frame prior to the initiation of the procedure.

ANESTHESIA/SEDATION:
Moderate (conscious) sedation was employed during this procedure. A
total of Versed 0.5 mg and Fentanyl 25 mcg was administered
intravenously.

Moderate Sedation Time: 15 minutes. The patient's level of
consciousness and vital signs were monitored continuously by
radiology nursing throughout the procedure under my direct
supervision.

CONTRAST:  None

COMPLICATIONS:
None immediate.

PROCEDURE:
The procedure, risks, benefits, and alternatives were explained to
the patient (via the use of a medical translator). Questions
regarding the procedure were encouraged and answered. The patient
understands and consents to the procedure. A timeout was performed
prior to the initiation of the procedure.

The patient was positioned supine on the CT gantry. Preprocedural
imaging was obtained of the lower pelvis. The skin overlying the
anterior aspect the lower pelvis was prepped and draped in usual
sterile fashion.

Under direct ultrasound guidance, an 18 gauge trocar needle was
advanced into the urinary bladder under direct ultrasound guidance.
Ultrasound image was saved for procedural documentation purposes. A
short Amplatz wire was coiled within the urinary bladder.
Appropriate position was confirmed with a limited pelvic CT.

The track was serially dilated ultimately allowing placement of a 16
French all-purpose drainage catheter with end coiled and locked
within the urinary bladder.

The catheter was connected to a gravity bag. The exit site of the
catheter was secured within interrupted suture. A dressing was
placed. The patient tolerated the procedure well without immediate
postprocedural complication.
FINDINGS: CT imaging demonstrates adequate distension of the urinary bladder.

There is no bowel interposed between the anterior aspect of the
urinary bladder and the ventral lower abdominal/pelvic wall.

Under ultrasound and CT guidance, a 16 French percutaneous drainage
catheter is appropriately positioned with end coiled and locked
within the urinary bladder.
IMPRESSION: Technically successful ultrasound and CT-guided placement of a 16
French suprapubic catheter.

PLAN:
Fluoroscopic guided exchange and potential conversion to a balloon
retention Council type catheter to be performed in 4-6 weeks as
indicated.

## 2020-05-06 IMAGING — CT CT IMAGE GUIDED DRAINAGE BY PERCUTANEOUS CATHETER
1 of 3 series · 13 of 32 positions shown, 18 images · non-contrast
Comparison: None

INDICATION: History of neurogenic bladder, previously managed with in and out
Foley catheterization, however now with penile abscess and as such
request made for placement of a image guided suprapubic catheter for
urinary diversion purposes.

EXAM:
ULTRASOUND AND CT GUIDED SUPRAPUBIC CATHETER PLACEMENT

[Series 2: i-spiral 5.0 b30f · axial · 0.59mm/px · z∈[+333,+459]mm · 13 of 42 slices shown, 18 images]
[im 3/42  soft-tissue]
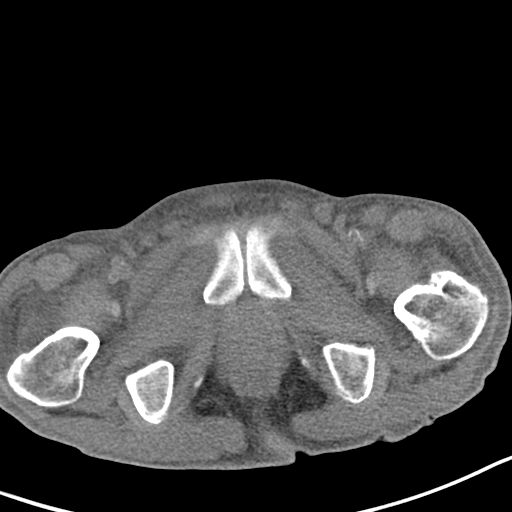
[im 3/42  bone]
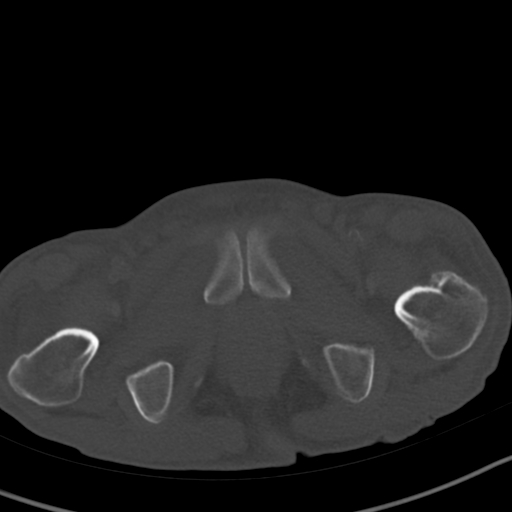
[im 6/42  soft-tissue]
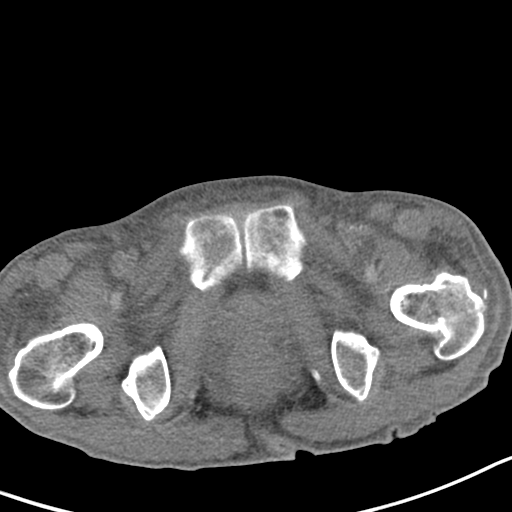
[im 9/42  soft-tissue]
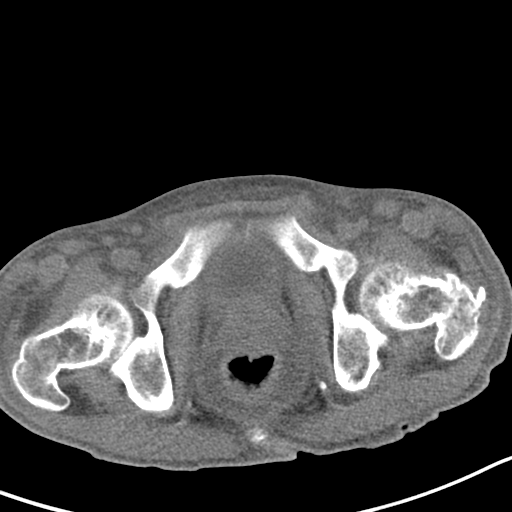
[im 14/42  soft-tissue]
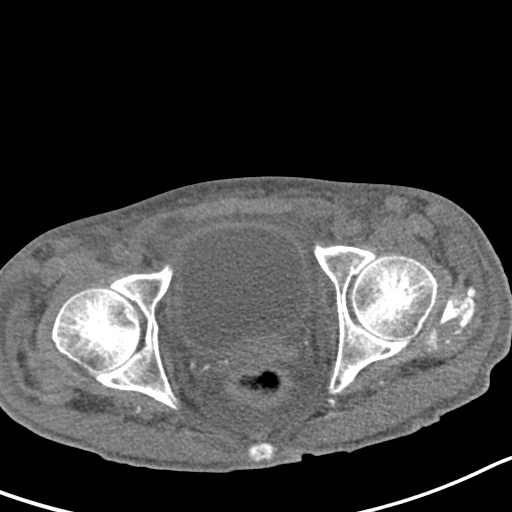
[im 17/42  soft-tissue]
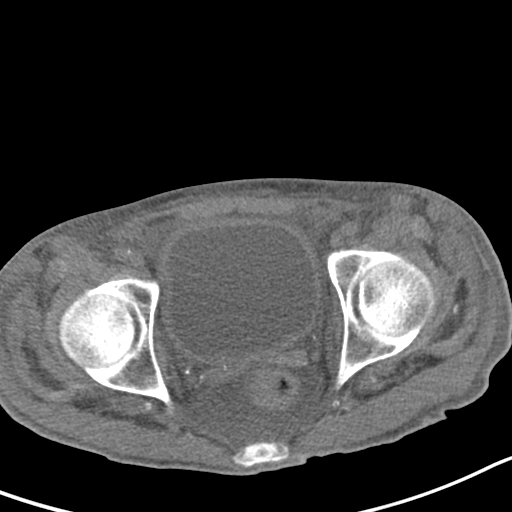
[im 20/42  soft-tissue]
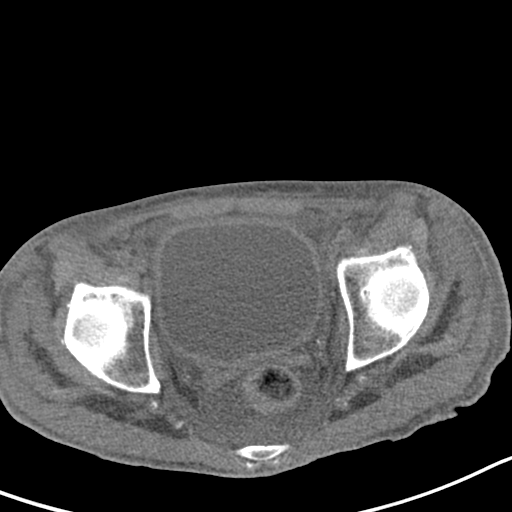
[im 22/42  soft-tissue]
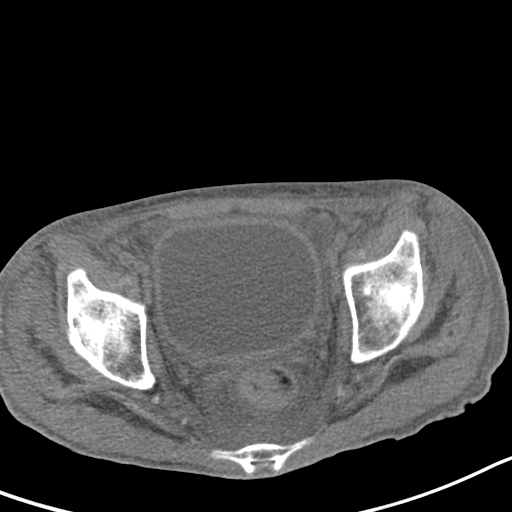
[im 25/42  soft-tissue]
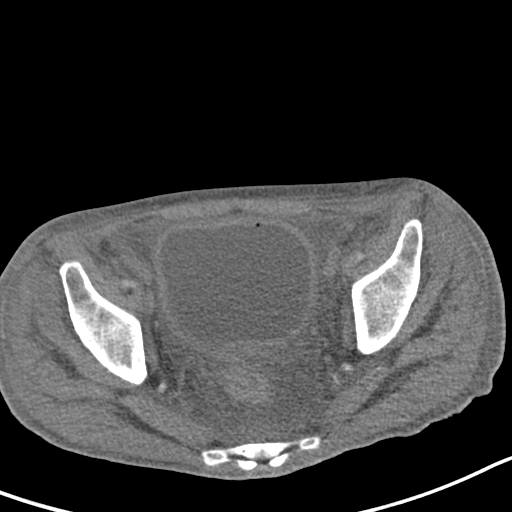
[im 28/42  soft-tissue]
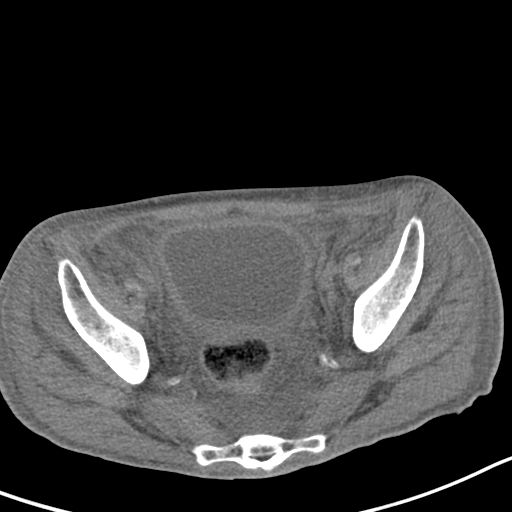
[im 28/42  bone]
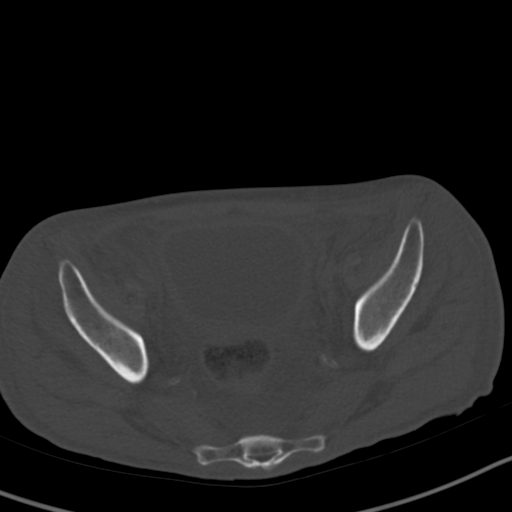
[im 31/42  lung]
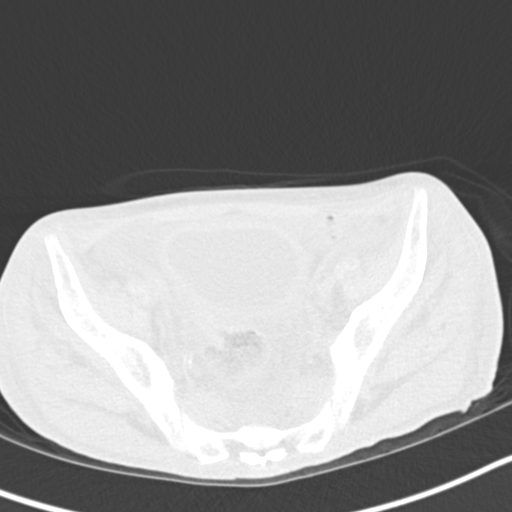
[im 33/42  soft-tissue]
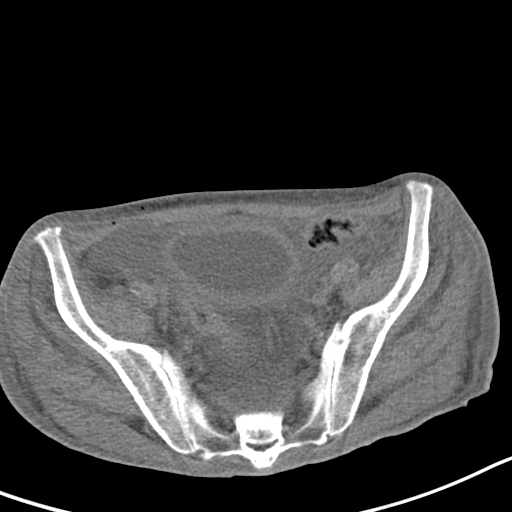
[im 33/42  lung]
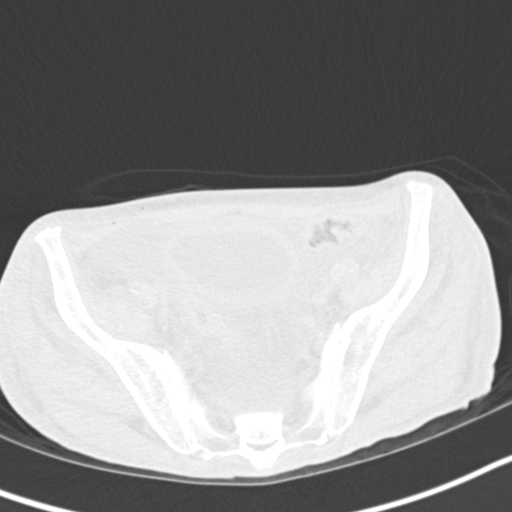
[im 36/42  soft-tissue]
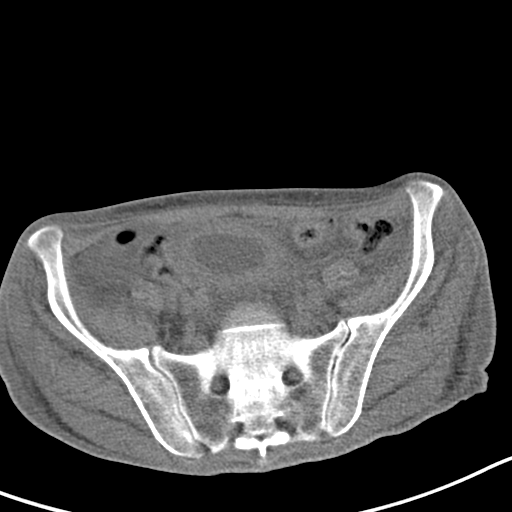
[im 36/42  lung]
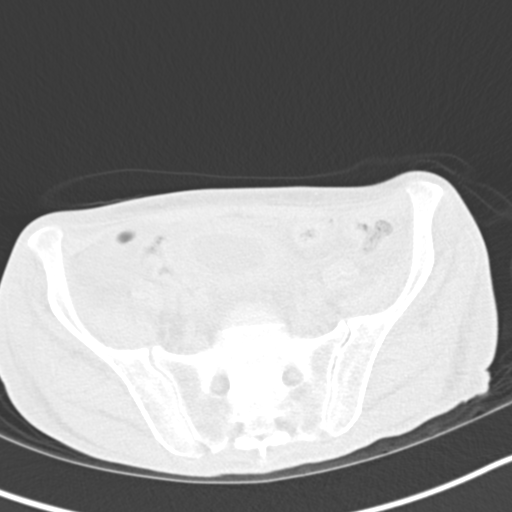
[im 39/42  soft-tissue]
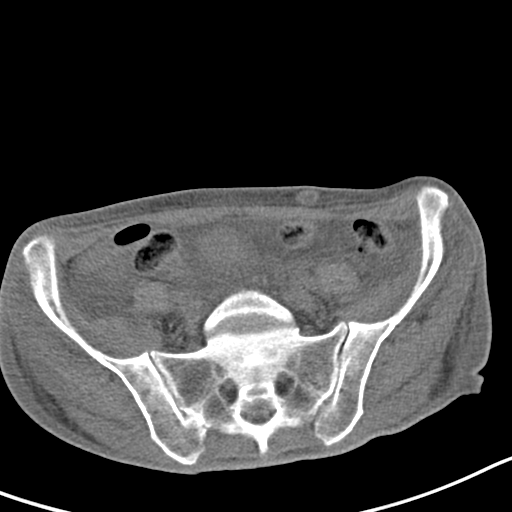
[im 39/42  lung]
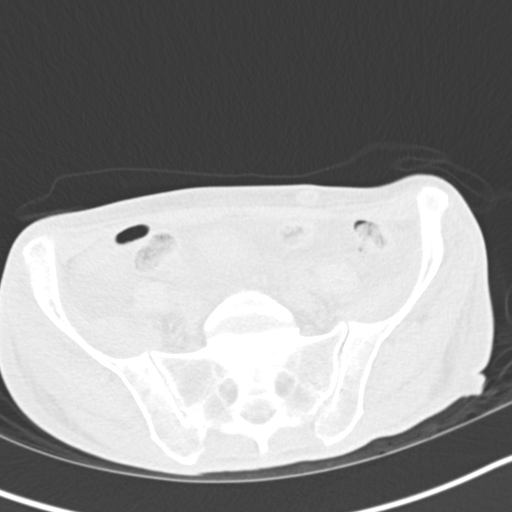

[13 of 32 positions shown; findings below may reference images not displayed]

MEDICATIONS:
None, the patient is currently admitted to the hospital receiving
intravenous antibiotics. The antibiotics were administered with an
appropriate time frame prior to the initiation of the procedure.

ANESTHESIA/SEDATION:
Moderate (conscious) sedation was employed during this procedure. A
total of Versed 0.5 mg and Fentanyl 25 mcg was administered
intravenously.

Moderate Sedation Time: 15 minutes. The patient's level of
consciousness and vital signs were monitored continuously by
radiology nursing throughout the procedure under my direct
supervision.

CONTRAST:  None

COMPLICATIONS:
None immediate.

PROCEDURE:
The procedure, risks, benefits, and alternatives were explained to
the patient (via the use of a medical translator). Questions
regarding the procedure were encouraged and answered. The patient
understands and consents to the procedure. A timeout was performed
prior to the initiation of the procedure.

The patient was positioned supine on the CT gantry. Preprocedural
imaging was obtained of the lower pelvis. The skin overlying the
anterior aspect the lower pelvis was prepped and draped in usual
sterile fashion.

Under direct ultrasound guidance, an 18 gauge trocar needle was
advanced into the urinary bladder under direct ultrasound guidance.
Ultrasound image was saved for procedural documentation purposes. A
short Amplatz wire was coiled within the urinary bladder.
Appropriate position was confirmed with a limited pelvic CT.

The track was serially dilated ultimately allowing placement of a 16
French all-purpose drainage catheter with end coiled and locked
within the urinary bladder.

The catheter was connected to a gravity bag. The exit site of the
catheter was secured within interrupted suture. A dressing was
placed. The patient tolerated the procedure well without immediate
postprocedural complication.
FINDINGS: CT imaging demonstrates adequate distension of the urinary bladder.

There is no bowel interposed between the anterior aspect of the
urinary bladder and the ventral lower abdominal/pelvic wall.

Under ultrasound and CT guidance, a 16 French percutaneous drainage
catheter is appropriately positioned with end coiled and locked
within the urinary bladder.
IMPRESSION: Technically successful ultrasound and CT-guided placement of a 16
French suprapubic catheter.

PLAN:
Fluoroscopic guided exchange and potential conversion to a balloon
retention Council type catheter to be performed in 4-6 weeks as
indicated.

## 2020-05-06 MED ORDER — SODIUM CHLORIDE 0.9 % IV SOLN
INTRAVENOUS | Status: AC | PRN
  Administered 2020-05-06: 250 mL/h via INTRAVENOUS

## 2020-05-06 MED ORDER — FENTANYL CITRATE (PF) 100 MCG/2ML IJ SOLN
INTRAMUSCULAR | Status: AC
  Filled 2020-05-06: qty 4

## 2020-05-06 MED ORDER — MIDAZOLAM HCL 2 MG/2ML IJ SOLN
INTRAMUSCULAR | Status: AC
  Filled 2020-05-06: qty 4

## 2020-05-06 MED ORDER — FENTANYL CITRATE (PF) 100 MCG/2ML IJ SOLN
INTRAMUSCULAR | Status: AC | PRN
  Administered 2020-05-06: 25 ug via INTRAVENOUS

## 2020-05-06 MED ORDER — MIDAZOLAM HCL 2 MG/2ML IJ SOLN
INTRAMUSCULAR | Status: AC | PRN
  Administered 2020-05-06: 0.5 mg via INTRAVENOUS

## 2020-05-06 NOTE — OR Nursing (Signed)
Radiology nurse held 2 pm heparin due to pend ing plan for suprapubic catheter insertion at 1530. Pt wasn't NPO until 0930 am.

## 2020-05-06 NOTE — Progress Notes (Signed)
Urology Inpatient Progress Note  Subjective: No acute events overnight. Afebrile, hypotensive this morning. WBC count down today, 8.7. Blood culture pending, on antibiotics as below. Tolerating I&O catheterization at the bedside. Patient reports slight improvement in penile pain today.  Anti-infectives: Anti-infectives (From admission, onward)   Start     Dose/Rate Route Frequency Ordered Stop   05/05/20 1500  levofloxacin (LEVAQUIN) tablet 500 mg     Discontinue    Note to Pharmacy: Penile shaft abscess, urethrocutaneous fistula ?   500 mg Oral Every other day 05/05/20 1441     05/02/20 1900  cefTRIAXone (ROCEPHIN) 1 g in sodium chloride 0.9 % 100 mL IVPB        1 g 200 mL/hr over 30 Minutes Intravenous  Once 05/02/20 1855 05/02/20 2059      Current Facility-Administered Medications  Medication Dose Route Frequency Provider Last Rate Last Admin  . 0.9 %  sodium chloride infusion   Intravenous Continuous Ralene Muskrat B, MD 125 mL/hr at 05/06/20 0546 New Bag at 05/06/20 0546  . acetaminophen (TYLENOL) tablet 650 mg  650 mg Oral Q6H PRN Ralene Muskrat B, MD       Or  . acetaminophen (TYLENOL) suppository 650 mg  650 mg Rectal Q6H PRN Sreenath, Sudheer B, MD      . albuterol (PROVENTIL) (2.5 MG/3ML) 0.083% nebulizer solution 2.5 mg  2.5 mg Nebulization Q2H PRN Sreenath, Sudheer B, MD      . aspirin EC tablet 81 mg  81 mg Oral Daily Ralene Muskrat B, MD   81 mg at 05/06/20 0920  . benztropine (COGENTIN) tablet 0.5 mg  0.5 mg Oral BID Ralene Muskrat B, MD   0.5 mg at 05/06/20 0919  . Chlorhexidine Gluconate Cloth 2 % PADS 6 each  6 each Topical Daily Dahal, Marlowe Aschoff, MD   6 each at 05/06/20 [phone]  . colesevelam [facility]) tablet 1,875 mg  1,875 mg Oral BID WC Ralene Muskrat B, MD   1,875 mg at 05/06/20 0919  . docusate sodium (COLACE) capsule 100 mg  100 mg Oral BID PRN Sreenath, Sudheer B, MD      . DULoxetine (CYMBALTA) DR capsule 30 mg  30 mg Oral Daily Priscella Mann, Sudheer  B, MD   30 mg at 89/38/10 1751  . folic acid (FOLVITE) tablet 1 mg  1 mg Oral Daily Priscella Mann, Sudheer B, MD   1 mg at 05/06/20 0258  . haloperidol (HALDOL) tablet 0.5 mg  0.5 mg Oral BID Ralene Muskrat B, MD   0.5 mg at 05/06/20 0921  . heparin injection 5,000 Units  5,000 Units Subcutaneous Q8H Sreenath, Sudheer B, MD   5,000 Units at 05/06/20 0546  . hydrALAZINE (APRESOLINE) injection 10 mg  10 mg Intravenous Q4H PRN Sreenath, Sudheer B, MD      . insulin aspart (novoLOG) injection 0-5 Units  0-5 Units Subcutaneous QHS Ralene Muskrat B, MD   4 Units at 05/02/20 2336  . insulin aspart (novoLOG) injection 0-9 Units  0-9 Units Subcutaneous TID WC Ralene Muskrat B, MD   2 Units at 05/05/20 1700  . insulin glargine (LANTUS) injection 8 Units  8 Units Subcutaneous Daily Barb Merino, MD   8 Units at 05/06/20 0919  . levofloxacin (LEVAQUIN) tablet 500 mg  500 mg Oral Maxwell Caul, MD   500 mg at 05/05/20 1706  . loperamide (IMODIUM) capsule 2 mg  2 mg Oral BID PRN Ralene Muskrat B, MD   2 mg at 05/05/20 2122  .  metoCLOPramide (REGLAN) injection 5 mg  5 mg Intravenous Q6H PRN Dahal, Marlowe Aschoff, MD   5 mg at 05/03/20 1755  . multivitamin with minerals tablet 1 tablet  1 tablet Oral Daily Ralene Muskrat B, MD   1 tablet at 05/06/20 [phone]  . ondansetron (ZOFRAN) tablet 4 mg  4 mg Oral Q6H PRN Ralene Muskrat B, MD   4 mg at 05/05/20 2131   Or  . ondansetron (ZOFRAN) injection 4 mg  4 mg Intravenous Q6H PRN Ralene Muskrat B, MD   4 mg at 05/04/20 1004  . oxyCODONE (Oxy IR/ROXICODONE) immediate release tablet 5 mg  5 mg Oral Q4H PRN Ralene Muskrat B, MD   5 mg at 05/05/20 2119  . pantoprazole (PROTONIX) EC tablet 40 mg  40 mg Oral Daily Ralene Muskrat B, MD   40 mg at 05/06/20 0921  . protein supplement (ENSURE MAX) liquid  11 oz Oral BID Terrilee Croak, MD   11 oz at 05/03/20 2112  . sodium bicarbonate tablet 1,300 mg  1,300 mg Oral BID Ralene Muskrat B, MD   1,300 mg at  05/06/20 0921  . thiamine tablet 100 mg  100 mg Oral Daily Ralene Muskrat B, MD   100 mg at 05/06/20 6283  . traZODone (DESYREL) tablet 25 mg  25 mg Oral QHS PRN Ralene Muskrat B, MD       Objective: Vital signs in last 24 hours: Temp:  [97.5 F (36.4 C)-98.6 F (37 C)] 97.5 F (36.4 C) (07/27 0806) Pulse Rate:  [67-77] 67 (07/27 0806) Resp:  [7-17] 17 (07/27 0806) BP: (84-130)/(69-95) 84/69 (07/27 0806) SpO2:  [100 %] 100 % (07/27 0806)  Intake/Output from previous day: 07/26 0701 - 07/27 0700 In: 3910.7 [I.V.:3910.7] Out: 2050 [Urine:2050] Intake/Output this shift: No intake/output data recorded.  Physical Exam Vitals and nursing note reviewed.  Constitutional:      General: He is not in acute distress.    Appearance: He is not ill-appearing, toxic-appearing or diaphoretic.  HENT:     Head: Normocephalic and atraumatic.  Pulmonary:     Effort: Pulmonary effort is normal. No respiratory distress.  Genitourinary:    Comments: Both draining sinuses visualized over the ventral aspect of the penile shaft, draining purulent material.  Wound tracks approximately 6 cm deep down the penile shaft, not able to probe into the urethra.  Bilateral testicles tender without crepitus or induration. Skin:    General: Skin is warm and dry.  Neurological:     Mental Status: He is alert and oriented to person, place, and time.    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  Foley catheter removed, tolerating bedside I&O catheterization  by nursing.  Wound appears stable today.  WBC count reassuring for infection progression.  Agree with continued antibiotics.  I had a lengthy conversation with the patient at the bedside today in which I again explained suprapubic catheterization and a recommendation that he undergo SP tube  placement today or tomorrow for management of his urinary retention.  I explained that given the location of his penile abscess, he is at risk for urethral erosion and that SP tube placement will allow [place] to bypass his urethra to allow for wound healing.  I explained that suprapubic catheter placement will require the same daily maintenance is a chronic indwelling Foley, namely cleaning the exposed part of the tubing daily and keeping his drainage bag empty.  He expressed understanding.  He is in agreement with SP tube placement.  I contacted IR, will proceed with suprapubic catheterization later today.  Recommendations: -Continue antibiotics and follow wound cultures -SP tube placement today: N.p.o. status, do not empty the bladder to maximize procedural visualization of the bladder -Daily wound checks  Debroah Loop, PA-C 05/06/2020

## 2020-05-06 NOTE — Progress Notes (Signed)
PROGRESS NOTE  Gregory Moody  DOB: 1975-12-26  PCP: [place] MPN:[id number]  DOA: 05/02/2020  LOS: 3 days   Chief Complaint  Patient presents with  . Weakness  . Hypotension   Brief narrative: Gregory Moody is a 44 y.o. male with PMH significant for diabetes mellitus, gastroparesis, CKD stage 4, chronic anemia, tuberculosis, COVID-19 infection July 2020, chronic diarrhea, urinary retention, unintentional weight loss, severe protein calorie malnutrition and recurrent admissions for uncontrolled diabetes as well as a previous admission to behavioral health unit due to poor self-care.  Patient presented to the ED on 7/23  for evaluation of weakness and dizziness and inability to tolerate p.o and reports being unable to care for himself.  He lives with his mother, denies any active alcohol or drug use.   In the ED, patient was afebrile, heart rate in 60s, initial blood pressure was as low at 67/47 which improved with normal saline bolus. Oxygen saturation 100% room air. Weak and emaciated, BMI 15 Labs with sodium 127, BUN/creatinine 59/3.51, blood glucose related to 345, hemoglobin low at 8.6 Urinalysis with hazy straw-colored urine with rare bacteria, moderate amount of hemoglobin and leukocytes.  Also, on previous admission (6/23-6/30), he had acute urinary retention and had a Foley catheter placed. Patient however did not follow-up with urology as an outpatient and continue to have catheter in place. He states that he has had leakage of urine and blood products around his urinary catheter. Given his severe weakness and recurrent admissions will place in observation status and hospital service to follow.  Subjective: Patient was seen and examined this morning.  He is agreeable to go for suprapubic catheter.  Penis hurts.  Doing in and out cath. Denies any nausea or vomiting.  Afebrile.  Assessment/Plan: Generalized weakness, failure to thrive,  Severe protein calorie malnutrition -Multifactorial: Poor oral intake, gastroparesis, uncontrolled diabetes, chronic diarrhea, medication nonadherence, poor self-care -albumin level low at 2.8. -Education officer, museum, diabetes coordinator and dietitian involved. -PT/OT consult placed.  May have difficulty placement.  Insulin-dependent type II diabetes mellitus, Uncontrolled hyperglycemia -A1c 8.3 on 7/23. -Blood sugar level to 345 on presentation. -Only on sliding scale insulin at home. Started on Lantus and sliding scale insulin. Decrease to Lantus 8 units daily and also continue sliding scale insulin.  Hyponatremia -Sodium level was low at 127 at presentation.   -Probably hypovolemic hyponatremia also with low solute intake and partly secondary to elevated blood glucose. -Continue isotonic fluid. Levels are appropriately improving with improvement of blood sugars.  Chronic anemia, anemia of chronic disease. -Hemoglobin low at 8.6 at presentation.  Baseline with 9-10. -No active bleeding but hemoglobin down to 7.2 today. -Continue to monitor.  Transfuse if less than 7.  Chronic kidney disease stage IV -Patient's creatinine appears to range between 3.35 and 5 -Presented with creatinine of 3.51,  -With IV hydration, creatinine improving. -Continue sodium bicarb 1300 mg twice daily.  Dyslipidemia -Home meds include Aspirin 81 mg daily, WelChol 1875 mg twice daily. -Continue the same.   -Lipid panel 7/24 with HDL low at 20, LDL at 76.  Urinary retention, penile shaft abscess? No history of traumatic instrumentation. -Patient had a Foley catheter placed approximately a month ago -He was instructed to follow-up for removal however appears that did not happen -Urinary retention likely secondary to diabetic neuropathy, immobility. -Foley catheter replaced in the ED on admission. -7/24, Foley catheter was removed.  Voiding trial attempted. Still retaining.  Now doing in and out  cath. -Physical exam  reveals abscess on the penile shaft that is draining, sent for cultures.  On Levaquin. -Seen by urology, decided to suprapubic catheter today with interventional radiology to bypass his urethra and to let penile abscess healed.  Depression with Previous history of suicidal ideation -Denies suicidal ideation at this time.   -Home meds include Cogentin 0.5 mg twice daily, Cymbalta 30 mg daily, Haldol 0.5 mg daily. -Continue the same.  Chronic diarrhea -Continue Imodium 2 mg twice daily as needed.  Gastroparesis -Improving with Reglan.  Continue Protonix.  Chronic wounds -Wound care consulted.  Mobility: Encourage ambulation. Continue to work with PT OT. Code Status:   Code Status: Full Code  Nutritional status: Body mass index is 15.2 kg/m. Nutrition Problem: Inadequate oral intake Etiology: social / environmental circumstances, poor appetite Signs/Symptoms: per patient/family report, percent weight loss Percent weight loss: 16.5 % (20 lbs x 3 weeks) Diet Order            Diet NPO time specified  Diet effective now                 DVT prophylaxis: heparin injection 5,000 Units Start: 05/02/20 2200   Antimicrobials:  Not on antibiotics Fluid: On normal saline.  Consultants: Urology. Family Communication:  None at bedside  Status is: Inpatient  Remains inpatient appropriate because:Ongoing diagnostic testing needed not appropriate for outpatient work up and IV treatments appropriate due to intensity of illness or inability to take PO   Dispo: The patient is from: Home              Anticipated d/c is to -lives with his parents were not expecting him back.  Does not have medical insurance for SNF placement.              Anticipated d/c date is: 3 days              Patient currently is not medically stable to d/c.   Infusions:  . sodium chloride 125 mL/hr at 05/06/20 0546    Scheduled Meds: . fentaNYL      . midazolam      . aspirin EC  81  mg Oral Daily  . benztropine  0.5 mg Oral BID  . Chlorhexidine Gluconate Cloth  6 each Topical Daily  . colesevelam  1,875 mg Oral BID WC  . DULoxetine  30 mg Oral Daily  . folic acid  1 mg Oral Daily  . haloperidol  0.5 mg Oral BID  . heparin  5,000 Units Subcutaneous Q8H  . insulin aspart  0-5 Units Subcutaneous QHS  . insulin aspart  0-9 Units Subcutaneous TID WC  . insulin glargine  8 Units Subcutaneous Daily  . levofloxacin  500 mg Oral QODAY  . multivitamin with minerals  1 tablet Oral Daily  . pantoprazole  40 mg Oral Daily  . Ensure Max Protein  11 oz Oral BID  . sodium bicarbonate  1,300 mg Oral BID  . thiamine  100 mg Oral Daily    Antimicrobials: Anti-infectives (From admission, onward)   Start     Dose/Rate Route Frequency Ordered Stop   05/05/20 1500  levofloxacin (LEVAQUIN) tablet 500 mg     Discontinue    Note to Pharmacy: Penile shaft abscess, urethrocutaneous fistula ?   500 mg Oral Every other day 05/05/20 1441     05/02/20 1900  cefTRIAXone (ROCEPHIN) 1 g in sodium chloride 0.9 % 100 mL IVPB        1 g 200  mL/hr over 30 Minutes Intravenous  Once 05/02/20 1855 05/02/20 2059     PRN meds: acetaminophen **OR** acetaminophen, albuterol, docusate sodium, hydrALAZINE, loperamide, metoCLOPramide (REGLAN) injection, ondansetron **OR** ondansetron (ZOFRAN) IV, oxyCODONE, traZODone   Objective: Vitals:   05/06/20 0030 05/06/20 0806  BP: (!) 130/94 (!) 84/69  Pulse: 67 67  Resp: 17 17  Temp: 97.8 F (36.6 C) (!) 97.5 F (36.4 C)  SpO2: 100% 100%    Intake/Output Summary (Last 24 hours) at 05/06/2020 1344 Last data filed at 05/06/2020 0945 Gross per 24 hour  Intake 3910.68 ml  Output 2350 ml  Net 1560.68 ml   Filed Weights   05/02/20 1550  Weight: 45.4 kg   Weight change:  Body mass index is 15.2 kg/m.   Physical Exam: General exam: Comfortable, calm.  Not in physical distress.  Thin built Skin: No rashes, lesions or ulcers. HEENT: Atraumatic,  normocephalic, supple neck, no obvious bleeding.  Poor oral hygiene. Lungs: Clear to auscultate bilaterally CVS: Regular rate and rhythm, no murmur GI/Abd soft, nontender, nondistended, bowel sound present CNS: Alert, awake, oriented x3 Psychiatry: Mood appropriate Extremities: No edema, no calf tenderness Penis: Edematous, some fluctuation along the ventral shaft, pus materials draining with opening at the ventral surface.  Data Review: I have personally reviewed the laboratory data and studies available.  Recent Labs  Lab 05/02/20 1558 05/02/20 1558 05/03/20 0407 05/04/20 0902 05/04/20 2259 05/05/20 0453 05/06/20 0516  WBC 10.2   < > 11.5* 15.8* 13.5* 11.8* 8.7  NEUTROABS 8.5*  --   --  13.3*  --  9.4* 6.2  HGB 8.6*   < > 8.6* 7.2* 7.2* 7.4* 8.1*  HCT 25.6*   < > 25.2* 22.3* 22.3* 22.5* 23.8*  MCV 88.3   < > 86.9 90.3 90.3 90.0 86.2  PLT 407*   < > 370 361 375 408* 410*   < > = values in this interval not displayed.   Recent Labs  Lab 05/02/20 1558 05/03/20 0407 05/04/20 0902 05/05/20 0453 05/06/20 0516  NA 127* 131* 129* 133* 134*  K 4.3 4.0 3.5 3.8 3.9  CL 97* 103 105 108 112*  CO2 21* 20* 18* 20* 18*  GLUCOSE 345* 306* 233* 92 90  BUN 59* 55* 41* 39* 36*  CREATININE 3.51* 2.89* 2.81* 2.70* 2.59*  CALCIUM 8.7* 8.2* 7.6* 7.2* 7.5*  MG  --   --  1.5* 1.6*  --   PHOS  --   --  2.8 3.0  --    Signed, Barb Merino, MD [facility] 05/06/2020   Total time spent: 30 minutes

## 2020-05-06 NOTE — Consult Note (Signed)
Chief Complaint: Patient was seen in consultation today for suprapubic catheter placement.  Referring Physician(s): Debroah Loop, PA-C  Supervising Physician: Sandi Mariscal  Patient Status: [name] - In-pt  History of Present Illness: Gregory Moody is a 44 y.o. male with a past medical history significant for major depression, tuberculosis, CKD, poorly controlled DM1 with multiple admissions for DKA, poor medical compliance, malnutrition, urinary retention with previous Foley catheter placement, AKI and penile abscess who presented to the ED on 05/02/20 with complaints of weakness and dizziness. He was found to be hypotensive, hyponatremic, creatinine 3.51, BUN 59, hgb 8.6. He was admitted for further evaluation and management. Urology was consulted for urinary retention as well as skin breakdown with purulent drainage to the underside of his penis noted by staff. Exploration of these wounds was performed yesterday by Dr. Erlene Quan and patient was noted to have a likely neurogenic bladder due to longstanding poorly controlled DM and he is unlikely to recover bladder function due to multiple voiding trials. Patient is unable to self cath and Foley catheter is currently contraindicated due to ongoing penile infection - IR has been asked to place a suprapubic catheter for urinary retention.  [name] denies any pain or concerns right now, medical interpreter present at bedside. He understands why he needs the suprapubic catheter and is agreeable to proceed.  Past Medical History:  Diagnosis Date  . COVID-19   . Diabetes mellitus without complication ([place])   . Gastroparesis   . Tuberculosis     Past Surgical History:  Procedure Laterality Date  . COLONOSCOPY N/A 01/25/2020   Procedure: COLONOSCOPY;  Surgeon: Toledo, Benay Pike, MD;  Location: [facility] ENDOSCOPY;  Service: Gastroenterology;  Laterality: N/A;  . ESOPHAGOGASTRODUODENOSCOPY N/A 02/03/2019   Procedure:  ESOPHAGOGASTRODUODENOSCOPY (EGD);  Surgeon: Toledo, Benay Pike, MD;  Location: [facility] ENDOSCOPY;  Service: Gastroenterology;  Laterality: N/A;    Allergies: Patient has no known allergies.  Medications: Prior to Admission medications   Medication Sig Start Date End Date Taking? Authorizing Provider  aspirin EC 81 MG tablet Take 81 mg by mouth daily.   Yes [provider]  benztropine (COGENTIN) 0.5 MG tablet Take 1 tablet (0.5 mg total) by mouth 2 (two) times daily. 04/09/20  Yes Pokhrel, Corrie Mckusick, MD  colesevelam (WELCHOL) 625 MG tablet Take 3 tablets (1,875 mg total) by mouth 2 (two) times daily with a meal. 02/09/20  Yes Wieting, Richard, MD  docusate sodium (COLACE) 100 MG capsule Take 1 capsule (100 mg total) by mouth 2 (two) times daily as needed for mild constipation. 03/28/20  Yes Lorella Nimrod, MD  DULoxetine (CYMBALTA) 30 MG capsule Take 1 capsule (30 mg total) by mouth daily. 04/10/20  Yes Pokhrel, Laxman, MD  haloperidol (HALDOL) 0.5 MG tablet Take 1 tablet (0.5 mg total) by mouth 2 (two) times daily. 04/09/20  Yes Pokhrel, Laxman, MD  insulin aspart (NOVOLOG) 100 UNIT/ML injection Inject 0-9 Units into the skin 3 (three) times daily before meals. 3 times daily before meals and at bedtime   Yes [provider]  loperamide (IMODIUM) 2 MG capsule Take 1 capsule (2 mg total) by mouth 2 (two) times daily as needed for diarrhea or loose stools. 02/09/20  Yes Wieting, Richard, MD  NEXIUM 40 MG capsule Take 40 mg by mouth daily. 04/21/20  Yes [provider]  pantoprazole (PROTONIX) 40 MG tablet Take 1 tablet (40 mg total) by mouth daily. 04/03/20  Yes Clapacs, Madie Reno, MD  sodium bicarbonate 650 MG tablet Take 2  tablets (1,300 mg total) by mouth 2 (two) times daily. 04/09/20  Yes Pokhrel, Laxman, MD     Family History  Problem Relation Age of Onset  . Diabetes Mother   . Diabetes Father     Social History   Socioeconomic History  . Marital status: Single    Spouse name:  Not on file  . Number of children: 3  . Years of education: Not on file  . Highest education level: Not on file  Occupational History  . Occupation: unemployed   Tobacco Use  . Smoking status: Current Some Day Smoker    Types: Cigars  . Smokeless tobacco: Never Used  Vaping Use  . Vaping Use: Never used  Substance and Sexual Activity  . Alcohol use: No  . Drug use: No  . Sexual activity: Not on file  Other Topics Concern  . Not on file  Social History Narrative   Resides with daughter only weekends    Social Determinants of Health   Financial Resource Strain:   . Difficulty of Paying Living Expenses:   Food Insecurity:   . Worried About Charity fundraiser in the Last Year:   . Arboriculturist in the Last Year:   Transportation Needs:   . Film/video editor (Medical):   [name] Lack of Transportation (Non-Medical):   Physical Activity:   . Days of Exercise per Week:   . Minutes of Exercise per Session:   Stress:   . Feeling of Stress :   Social Connections:   . Frequency of Communication with Friends and Family:   . Frequency of Social Gatherings with Friends and Family:   . Attends Religious Services:   . Active Member of Clubs or Organizations:   . Attends Archivist Meetings:   [name] Marital Status:      Review of Systems: A 12 point ROS discussed and pertinent positives are indicated in the HPI above.  All other systems are negative.  Review of Systems  Constitutional: Negative for appetite change, chills and fever.  Respiratory: Negative for cough and shortness of breath.   Cardiovascular: Negative for chest pain.  Gastrointestinal: Negative for abdominal pain, nausea and vomiting.  Musculoskeletal: Negative for back pain.  Neurological: Negative for headaches.    Vital Signs: BP (!) 84/69 (BP Location: Left Arm)   Pulse 67   Temp (!) 97.5 F (36.4 C) (Oral)   Resp 17   Ht 5\' 8"  (1.727 m)   Wt 100 lb (45.4 kg)   SpO2 100%   BMI 15.20 kg/m    Physical Exam Vitals and nursing note reviewed.  Constitutional:      General: He is not in acute distress.    Appearance: He is ill-appearing.  HENT:     Mouth/Throat:     Mouth: Mucous membranes are moist.     Pharynx: Oropharynx is clear. No oropharyngeal exudate or posterior oropharyngeal erythema.  Cardiovascular:     Rate and Rhythm: Normal rate and regular rhythm.  Pulmonary:     Effort: Pulmonary effort is normal.     Breath sounds: Normal breath sounds.  Abdominal:     General: There is no distension.     Palpations: Abdomen is soft.     Tenderness: There is no abdominal tenderness.  Skin:    General: Skin is warm and dry.  Neurological:     Mental Status: He is alert and oriented to person, place, and time.  Psychiatric:  Mood and Affect: Mood normal.        Behavior: Behavior normal.        Thought Content: Thought content normal.        Judgment: Judgment normal.      MD Evaluation Airway: WNL Heart: WNL Abdomen: WNL Chest/ Lungs: WNL ASA  Classification: 3 Mallampati/Airway Score: Two   Imaging: DG Chest Port 1 View  Result Date: 05/02/2020 CLINICAL DATA:  Weakness and hypotension EXAM: PORTABLE CHEST 1 VIEW COMPARISON:  04/17/2020, CT 12/29/2019 FINDINGS: A right upper lobe ovoid nodular opacity is mildly decreased compared to 04/17/2020. Normal heart size. No pneumothorax. IMPRESSION: Mildly decreased right upper lobe nodular opacity as compared to 04/17/2020. No interval acute airspace disease. Electronically Signed   By: Donavan Foil M.D.   On: 05/02/2020 16:45   DG Chest Portable 1 View  Result Date: 04/17/2020 CLINICAL DATA:  Weakness. EXAM: PORTABLE CHEST 1 VIEW COMPARISON:  03/25/2020.  02/28/2019.  CT 12/29/2019. FINDINGS: Mediastinum and hilar structures normal. Heart size normal. Persistent density/mass right upper lung. Malignancy cannot be excluded. Mild bibasilar subsegmental atelectasis and or scarring. Prominent nipple shadows  again noted. No pleural effusion or pneumothorax. IMPRESSION: 1. Persistent density/mass right upper lung. Malignancy cannot be excluded. 2. Mild bibasilar subsegmental axis and or scarring. Electronically Signed   By: Marcello Moores  Register   On: 04/17/2020 07:32    Labs:  CBC: Recent Labs    05/04/20 0902 05/04/20 2259 05/05/20 0453 05/06/20 0516  WBC 15.8* 13.5* 11.8* 8.7  HGB 7.2* 7.2* 7.4* 8.1*  HCT 22.3* 22.3* 22.5* 23.8*  PLT 361 375 408* 410*    COAGS: Recent Labs    03/24/20 2100 05/06/20 0934  INR 1.4* 1.1  APTT 45* 46*    BMP: Recent Labs    05/03/20 0407 05/04/20 0902 05/05/20 0453 05/06/20 0516  NA 131* 129* 133* 134*  K 4.0 3.5 3.8 3.9  CL 103 105 108 112*  CO2 20* 18* 20* 18*  GLUCOSE 306* 233* 92 90  BUN 55* 41* 39* 36*  CALCIUM 8.2* 7.6* 7.2* 7.5*  CREATININE 2.89* 2.81* 2.70* 2.59*  GFRNONAA 25* 26* 27* 29*  GFRAA 29* 30* 32* 33*    LIVER FUNCTION TESTS: Recent Labs    02/28/20 1507 03/01/20 1008 03/24/20 1226 03/31/20 0404 04/03/20 0522 04/08/20 0502 04/16/20 2111 05/02/20 1558  BILITOT 1.1  --  0.7  --   --   --  0.6 0.8  AST 10*  --  <5*  --   --   --  10* 21  ALT 12  --  5  --   --   --  11 <5  ALKPHOS 94  --  54  --   --   --  49 110  PROT 8.0  --  5.5*  --   --   --  6.3* 7.9  ALBUMIN 3.6   < > 2.7*   < > 2.2* 2.3* 2.6* 2.8*   < > = values in this interval not displayed.    TUMOR MARKERS: No results for input(s): AFPTM, CEA, CA199, CHROMGRNA in the last 8760 hours.  Assessment and Plan:  44 y/o M with history of poorly controlled DM1, medical non compliance, malnutrition, AKI and urinary retention found to have new abscess to the ventral aspect of his penis s/p exploration by urology yesterday. IR has been asked to place a suprapubic catheter for urinary retention and likely neurogenic bladder.  Patient has been NPO since 0930, last  does heparin SQ 0546. Afebrile, WBC 8.7, hgb 8.1, plt 410, INR 1.1. Currently receiving  Levaquin 500 mg PO QOD - no need for further prophylaxis per IR attending.  Risks and benefits discussed with the patient including bleeding, infection, damage to adjacent structures, bowel perforation/fistula connection, and sepsis.  All of the patient's questions were answered, patient is agreeable to proceed.  Consent signed and in chart.  Thank you for this interesting consult.  I greatly enjoyed meeting Orazio Weller and look forward to participating in their care.  A copy of this report was sent to the requesting provider on this date.  Electronically Signed: Joaquim Nam, PA-C 05/06/2020, 12:34 PM   I spent a total of 40 Minutes in face to face in clinical consultation, greater than 50% of which was counseling/coordinating care for suprapubic catheter placement.

## 2020-05-06 NOTE — TOC Progression Note (Signed)
Transition of Care (TOC) - Progression Note    Patient Details  Name: Gregory Moody MRN: [id number] Date of Birth: 04/30/1976  Transition of Care (TOC) CM/SW Contact   J , RN Phone Number: 05/06/2020, 11:17 AM  Clinical Narrative:   Met with the patient using the electronic interpreter for spanish,   He stated that he has been living with his mother and would like to go to SNF, I explained that he has no insurance and has not applied for medicaid or disability and therefore he could not get a bed at SNF until he applies for both, I explained even after applying for both I will need to find a facility willing to accept him as a patient with an LOG.  He agrees to talk to Financial services to strart the application process, I called  Amy S. With [facility], she stated that she will check to see if he has been referred to [facility], if not she will call me back to let me know.  He stated that [place] go [place] that he is tired of dealing with the stuff here, If he has to he will go back to his mothers.  TOC awaiting to hear back from [facility] to determine if he was assigned to them      Expected Discharge Plan and Services                                                 Social Determinants of Health (SDOH) Interventions    Readmission Risk Interventions Readmission Risk Prevention Plan 04/09/2020 03/31/2020 02/29/2020  Transportation Screening Complete Complete Complete  PCP or Specialist Appt within 3-5 Days - - -  Social Work Consult for Recovery Care Planning/Counseling - - -  Palliative Care Screening - - -  Medication Review (RN Care Manager) Complete Complete Complete  PCP or Specialist appointment within 3-5 days of discharge Patient refused - Complete  HRI or Home Care Consult Not Complete Complete Complete  SW Recovery Care/Counseling Consult - Complete -  Palliative Care Screening Complete Not Applicable -  Skilled  Nursing Facility Not Applicable Not Applicable Complete  Some recent data might be hidden   

## 2020-05-06 NOTE — Progress Notes (Signed)
OT Cancellation Note  Patient Details Name: Keishon Chavarin MRN: [id number] DOB: 25-May-1976   Cancelled Treatment:    Reason Eval/Treat Not Completed: Patient at procedure or test/ unavailable. Pt unavailable, out for imaging/procedure. Will re-attempt OT tx at later date/time as pt is available and medically appropriate.   Jeni Salles, MPH, MS, OTR/L ascom [phone] 05/06/20, 3:27 PM

## 2020-05-06 NOTE — Procedures (Signed)
Pre procedural Dx: Penile abscess; Neurogenic bladder Post procedural Dx: Same  Technically successful [place] and CT guided placed of a 16 Fr drainage catheter placement into the urinary bladder yielding return of clear urine.    EBL: None Complications: None immediate  Ronny Bacon, MD Pager #: [phone]

## 2020-05-06 NOTE — TOC Progression Note (Signed)
Transition of Care [facility]) - Progression Note    Patient Details  Name: Gregory Moody MRN: [id number] Date of Birth: Feb 18, 1976  Transition of Care [facility]) CM/SW Contact  Su Hilt, RN Phone Number: 05/06/2020, 9:49 AM  Clinical Narrative:   Patient has no insurance, this Chief Strategy Officer contacted Janett Billow at [facility] to inquire if they will accept LOG and they can't accept patient that has not applied for Disability, the patient refuses to work with Financial department to help him apply for Medicaid and Disability, Patient to go today for placement of a Suprapubic catheter, Went in the room to once again encourage the patient to work with the financial department to apply for Medicaid and disability, he was with nursing getting ready to go down for procedure, will attempt again.         Expected Discharge Plan and Services                                                 Social Determinants of Health (SDOH) Interventions    Readmission Risk Interventions Readmission Risk Prevention Plan 04/09/2020 03/31/2020 02/29/2020  Transportation Screening Complete Complete Complete  PCP or Specialist Appt within 3-5 Days - - -  Social Work Consult for [place] - - -  Medication Review Press photographer) Complete Complete Complete  PCP or Specialist appointment within 3-5 days of discharge Patient refused - Complete  HRI or [place] Not Complete Complete Complete  SW Recovery Care/Counseling Consult - Complete -  Palliative Care Screening Complete Not Applicable -  [place] Not Applicable Not Applicable Complete  Some recent data might be hidden

## 2020-05-07 ENCOUNTER — Ambulatory Visit: Payer: Self-pay | Admitting: Physician Assistant

## 2020-05-07 DIAGNOSIS — I959 Hypotension, unspecified: Secondary | ICD-10-CM

## 2020-05-07 DIAGNOSIS — N4821 Abscess of corpus cavernosum and penis: Secondary | ICD-10-CM

## 2020-05-07 DIAGNOSIS — N319 Neuromuscular dysfunction of bladder, unspecified: Secondary | ICD-10-CM

## 2020-05-07 LAB — CBC WITH DIFFERENTIAL/PLATELET
Abs Immature Granulocytes: 0.02 10*3/uL (ref 0.00–0.07)
Basophils Absolute: 0 10*3/uL (ref 0.0–0.1)
Basophils Relative: 0 %
Eosinophils Absolute: 0.1 10*3/uL (ref 0.0–0.5)
Eosinophils Relative: 1 %
HCT: 24.4 % — ABNORMAL LOW (ref 39.0–52.0)
Hemoglobin: 7.9 g/dL — ABNORMAL LOW (ref 13.0–17.0)
Immature Granulocytes: 0 %
Lymphocytes Relative: 21 %
Lymphs Abs: 1.7 10*3/uL (ref 0.7–4.0)
MCH: 29.3 pg (ref 26.0–34.0)
MCHC: 32.4 g/dL (ref 30.0–36.0)
MCV: 90.4 fL (ref 80.0–100.0)
Monocytes Absolute: 0.4 10*3/uL (ref 0.1–1.0)
Monocytes Relative: 4 %
Neutro Abs: 5.9 10*3/uL (ref 1.7–7.7)
Neutrophils Relative %: 74 %
Platelets: 462 10*3/uL — ABNORMAL HIGH (ref 150–400)
RBC: 2.7 MIL/uL — ABNORMAL LOW (ref 4.22–5.81)
RDW: 13.7 % (ref 11.5–15.5)
WBC: 8.1 10*3/uL (ref 4.0–10.5)
nRBC: 0 % (ref 0.0–0.2)

## 2020-05-07 LAB — GLUCOSE, CAPILLARY
Glucose-Capillary: 177 mg/dL — ABNORMAL HIGH (ref 70–99)
Glucose-Capillary: 159 mg/dL — ABNORMAL HIGH (ref 70–99)
Glucose-Capillary: 139 mg/dL — ABNORMAL HIGH (ref 70–99)
Glucose-Capillary: 145 mg/dL — ABNORMAL HIGH (ref 70–99)

## 2020-05-07 LAB — CULTURE, BLOOD (ROUTINE X 2)
Culture: NO GROWTH
Culture: NO GROWTH
Special Requests: ADEQUATE

## 2020-05-07 LAB — BASIC METABOLIC PANEL
Anion gap: 9 (ref 5–15)
BUN: 34 mg/dL — ABNORMAL HIGH (ref 6–20)
CO2: 16 mmol/L — ABNORMAL LOW (ref 22–32)
Calcium: 7.7 mg/dL — ABNORMAL LOW (ref 8.9–10.3)
Chloride: 110 mmol/L (ref 98–111)
Creatinine, Ser: 2.48 mg/dL — ABNORMAL HIGH (ref 0.61–1.24)
GFR calc Af Amer: 35 mL/min — ABNORMAL LOW (ref >60)
GFR calc non Af Amer: 30 mL/min — ABNORMAL LOW (ref >60)
Glucose, Bld: 220 mg/dL — ABNORMAL HIGH (ref 70–99)
Potassium: 4.4 mmol/L (ref 3.5–5.1)
Sodium: 135 mmol/L (ref 135–145)

## 2020-05-07 MED ORDER — MIDODRINE HCL 5 MG PO TABS
5.0000 mg | ORAL_TABLET | Freq: Three times a day (TID) | ORAL | Status: DC
  Administered 2020-05-07 – 2020-05-09 (×5): 5 mg via ORAL
  Filled 2020-05-07 (×4): qty 1

## 2020-05-07 NOTE — Progress Notes (Signed)
[place] [facility] [place] NAME: Gregory Moody    MR#:  [id number]  DATE OF BIRTH:  12-27-1975  SUBJECTIVE:  via [place] interpreter. Patient is weak and debilitated. Unable to provide self-care. Status post suprapubic catheter placement. Denies any complaints other than weakness and dizziness when he tries to walk.  REVIEW OF SYSTEMS:   Review of Systems  Constitutional: Negative for chills, fever and weight loss.  HENT: Negative for ear discharge, ear pain and nosebleeds.   Eyes: Negative for blurred vision, pain and discharge.  Respiratory: Negative for sputum production, shortness of breath, wheezing and stridor.   Cardiovascular: Negative for chest pain, palpitations, orthopnea and PND.  Gastrointestinal: Negative for abdominal pain, diarrhea, nausea and vomiting.  Genitourinary: Negative for frequency and urgency.  Musculoskeletal: Negative for back pain and joint pain.  Neurological: Negative for sensory change, speech change, focal weakness and weakness.  Psychiatric/Behavioral: Negative for depression and hallucinations. The patient is not nervous/anxious.    Tolerating Diet:yes Tolerating PT: pending  DRUG ALLERGIES:  No Known Allergies  VITALS:  Blood pressure (!) 114/86, pulse 77, temperature 97.6 F (36.4 C), resp. rate 17, height 5\' 8"  (1.727 m), weight 45.4 kg, SpO2 100 %.  PHYSICAL EXAMINATION:   Physical Exam  GENERAL:  44 y.o.-year-old patient lying in the bed with no acute distress. Thin cachectic malnourished EYES: Pupils equal, round, reactive to light and accommodation. No scleral icterus.   HEENT: Head atraumatic, normocephalic. Oropharynx and nasopharynx clear.  NECK:  Supple, no jugular venous distention. No thyroid enlargement, no tenderness.  LUNGS: Normal breath sounds bilaterally, no wheezing, rales, rhonchi. No use of accessory muscles of respiration.  CARDIOVASCULAR: S1, S2 normal. No murmurs, rubs,  or gallops.  ABDOMEN: Soft, nontender, nondistended. Bowel sounds present. No organomegaly or mass.  EXTREMITIES: No cyanosis, clubbing or edema b/l.    NEUROLOGIC: Cranial nerves II through XII are intact. No focal Motor or sensory deficits b/l. Generalized ability PSYCHIATRIC:  patient is alert and oriented x 3.  SKIN: patient has bruises skin tears from previous injuries at home Pressure Injury 12/28/19 Elbow Left Stage 2 -  Partial thickness loss of dermis presenting as a shallow open injury with a red, pink wound bed without slough. (Active)  12/28/19 1422  Location: Elbow  Location Orientation: Left  Staging: Stage 2 -  Partial thickness loss of dermis presenting as a shallow open injury with a red, pink wound bed without slough.  Wound Description (Comments):   Present on Admission: Yes (Present on transfer to 1A)     Pressure Injury 12/28/19 Elbow Right Stage 3 -  Full thickness tissue loss. Subcutaneous fat may be visible but bone, tendon or muscle are NOT exposed. (Active)  12/28/19 1422  Location: Elbow  Location Orientation: Right  Staging: Stage 3 -  Full thickness tissue loss. Subcutaneous fat may be visible but bone, tendon or muscle are NOT exposed.  Wound Description (Comments):   Present on Admission: Yes (Present on transfer to 1A)        LABORATORY PANEL:  CBC Recent Labs  Lab 05/07/20 0519  WBC 8.1  HGB 7.9*  HCT 24.4*  PLT 462*    Chemistries  Recent Labs  Lab 05/02/20 1558 05/03/20 0407 05/05/20 0453 05/06/20 0516 05/07/20 0519  NA 127*   < > 133*   < > 135  K 4.3   < > 3.8   < > 4.4  CL 97*   < >  108   < > 110  CO2 21*   < > 20*   < > 16*  GLUCOSE 345*   < > 92   < > 220*  BUN 59*   < > 39*   < > 34*  CREATININE 3.51*   < > 2.70*   < > 2.48*  CALCIUM 8.7*   < > 7.2*   < > 7.7*  MG  --    < > 1.6*  --   --   AST 21  --   --   --   --   ALT <5  --   --   --   --   ALKPHOS 110  --   --   --   --   BILITOT 0.8  --   --   --   --    < > =  values in this interval not displayed.   Cardiac Enzymes No results for input(s): TROPONINI in the last 168 hours. RADIOLOGY:  [place] Intraoperative  Result Date: 05/06/2020 CLINICAL DATA:  Ultrasound was provided for use by the ordering physician, and a technical charge was applied by the performing facility.  No radiologist interpretation/professional services rendered.   CT IMAGE GUIDED DRAINAGE BY PERCUTANEOUS CATHETER  Result Date: 05/06/2020 INDICATION: History of neurogenic bladder, previously managed with in and out Foley catheterization, however now with penile abscess and as such request made for placement of a image guided suprapubic catheter for urinary diversion purposes. EXAM: ULTRASOUND AND CT GUIDED SUPRAPUBIC CATHETER PLACEMENT COMPARISON:  None MEDICATIONS: None, the patient is currently admitted to the hospital receiving intravenous antibiotics. The antibiotics were administered with an appropriate time frame prior to the initiation of the procedure. ANESTHESIA/SEDATION: Moderate (conscious) sedation was employed during this procedure. A total of Versed 0.5 mg and Fentanyl 25 mcg was administered intravenously. Moderate Sedation Time: 15 minutes. The patient's level of consciousness and vital signs were monitored continuously by radiology nursing throughout the procedure under my direct supervision. CONTRAST:  None COMPLICATIONS: None immediate. PROCEDURE: The procedure, risks, benefits, and alternatives were explained to the patient (via the use of a Forensic scientist). Questions regarding the procedure were encouraged and answered. The patient understands and consents to the procedure. A timeout was performed prior to the initiation of the procedure. The patient was positioned supine on the CT gantry. Preprocedural imaging was obtained of the lower pelvis. The skin overlying the anterior aspect the lower pelvis was prepped and draped in usual sterile fashion. Under direct ultrasound  guidance, an 18 gauge trocar needle was advanced into the urinary bladder under direct ultrasound guidance. Ultrasound image was saved for procedural documentation purposes. A short Amplatz wire was coiled within the urinary bladder. Appropriate position was confirmed with a limited pelvic CT. The track was serially dilated ultimately allowing placement of a 16 French all-purpose drainage catheter with end coiled and locked within the urinary bladder. The catheter was connected to a gravity bag. The exit site of the catheter was secured within interrupted suture. A dressing was placed. The patient tolerated the procedure well without immediate postprocedural complication. FINDINGS: CT imaging demonstrates adequate distension of the urinary bladder. There is no bowel interposed between the anterior aspect of the urinary bladder and the ventral lower abdominal/pelvic wall. Under ultrasound and CT guidance, a 19 French percutaneous drainage catheter is appropriately positioned with end coiled and locked within the urinary bladder. IMPRESSION: Technically successful ultrasound and CT-guided placement of a 16 French suprapubic catheter.  PLAN: Fluoroscopic guided exchange and potential conversion to a balloon retention Council type catheter to be performed in 4-6 weeks as indicated. Electronically Signed   By: Sandi Mariscal M.D.   On: 05/06/2020 15:50   ASSESSMENT AND PLAN:  Gregory Moody a 44 y.o.malewith PMH significant for diabetes mellitus, gastroparesis, CKD stage 4, chronic anemia, tuberculosis, COVID-19 infection July 2020, chronic diarrhea, urinary retention, unintentional weight loss, severe protein calorie malnutrition and recurrent admissions for uncontrolled diabetes as well as a previous admission to behavioral health unit due to poor self-care. Patient presented to the ED on 7/23  for evaluation of weakness and dizziness and inability to tolerate p.o and reports being unable to care for  himself. He lives with his mother, denies any active alcohol or drug use.    Urinary retention, penile shaft abscess--s/p packing of abscess  No history of traumatic instrumentation. -Patient had a Foley catheter placed approximately a month ago -Urinary retention likely secondary to diabetic neuropathy, immobility. -Physical exam reveals abscess on the penile shaft that is draining, sent for cultures.   -On Levaquin. -7/27>>Seen by urology Dr brandon--now s/p decided to suprapubic catheter with interventional radiology to bypass his urethra and to let penile abscess healed.  Generalized weakness, failure to thrive, Severe protein calorie malnutrition -Multifactorial: Poor oral intake, gastroparesis, uncontrolled diabetes, chronic diarrhea, medication nonadherence, poor self-care -albumin level low at 2.8. -Education officer, museum, diabetes coordinator and dietitian involved. -PT/OT consult placed.  May have difficulty placement--due to being undocumented and no payor source  Insulin-dependent type II diabetes mellitus, Uncontrolled hyperglycemia -A1c 8.3 on 7/23. -Blood sugar level to 345 on presentation. -Only on sliding scale insulin at home.  -on  Lantus 8 units daily and also continue sliding scale insulin.  Orthostatic hypotension appears due to diabetic autonomic neuropathy-will start patient on midodrine 5 mg TID   hyponatremia -Sodium level was low at 127 at presentation.   -Probably hypovolemic hyponatremia also with low solute intake and partly secondary to elevated blood glucose. -na 135  Chronic anemia, anemia of chronic disease. -Hemoglobin low at 8.6 at presentation.  Baseline with 9-10. -No active bleeding but hemoglobin down to 7.2 today. -Continue to monitor.  Transfuse if less than 7.  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  Dyslipidemia -Home meds include Aspirin 81 mg daily, WelChol 1875 mg twice daily.   -Lipid panel 7/24 with HDL low at 20, LDL at 76.  Depression with Previous history of suicidal ideation -Denies suicidal ideation at this time.   -Home meds include Cogentin 0.5 mg twice daily, Cymbalta 30 mg daily, Haldol 0.5 mg daily.  Chronic diarrhea -Continue Imodium 2 mg twice daily as needed.  Gastroparesis due to DM-2 -Improving with Reglan.  Continue Protonix.  Chronic wounds--POA -Wound care consulted.   Procedures: suprapubic catheter placement Family communication : none Consults : urology CODE STATUS: full DVT Prophylaxis : heparin  Status is: Inpatient  Remains inpatient appropriate because:Hemodynamically unstable   Dispo:  Patient From: Home  Planned Disposition: most likely home. Patient has had challenges with being undocumented, no payer source. Home health has refused to accept him given poor noncompliance and follow-up  expected discharge date: 2 to 3 days  medically stable for discharge: No   TOTAL TIME TAKING CARE OF THIS PATIENT: *25* minutes.  >50% time spent on counselling  and coordination of care  Note: This dictation was prepared with Dragon dictation along with smaller phrase technology. Any transcriptional errors that result from this process are unintentional.  Fritzi Mandes M.D    [facility]   CC: Primary care physician; [facility] IncPatient ID: Gregory Moody, male   DOB: November 19, 1975, 44 y.o.   MRN: [id number]

## 2020-05-07 NOTE — Plan of Care (Signed)

## 2020-05-07 NOTE — Progress Notes (Signed)
Physical Therapy Treatment Patient Details Name: Gregory Moody MRN: [id number] DOB: 21-Jan-1976 Today's Date: 05/07/2020    History of Present Illness Gregory Moody is a 44 y.o. male with medical history significant of diabetes mellitus, GERD, gastroparesis, DKA, anemia, tobacco abuse, esophageal candidiasis, CKD, malnutrition COVID-19 infection 04/23/2019, C. difficile colitis, and multiple admissions to the ED as well as the the behavioral health unit this year who presents for weakness and dizziness.    PT Comments    In-house interpreter, Herb Grays, utilized throughout session. Pt pleasant and motivated to participate during the session. Pt had previously been disinterested in participating with PT, but appeared motivated to get up and try to walk today. Pt performed supine and sitting exercises with minimal cueing and no physical assist. Orthostatic vitals measured closely during today's session. Last BP measured prior to PT session, with pt in supine, was 114/86 with similar measurements taken multiple times throughout the morning. Pt able to bring self to sitting at EOB with mod-I including increased time and effort. Pt reported some dizziness/lightheadedness at this time with BP measured as 92/66 in seated position. Some postural sway noted in sitting, but pt overall able to self-correct and stabilize. After doing seated exercises and remaining in sitting for ~73min, pt stood up with RW and close CGA due to orthostatic vitals. Pt did not report an increase in dizziness, but BP immediately dropped significantly to 66/48. MD/nursing notified. Pt able to side step ~31ft to get closer to [facility], with further ambulation deferred due to orthostatic signs. Pt returned to sitting and then supine with CGA for safety. Pt will benefit from PT services in a SNF setting upon discharge to safely address deficits listed in patient problem list for decreased caregiver assistance and eventual return to  [facility].     Follow Up Recommendations  SNF     Equipment Recommendations  Rolling walker with 5" wheels    Recommendations for Other Services       Precautions / Restrictions Precautions Precautions: Fall Restrictions Weight Bearing Restrictions: No Other Position/Activity Restrictions: 7/27 placement of SP cath    Mobility  Bed Mobility Overal bed mobility: Modified Independent Bed Mobility: Supine to Sit;Sit to Supine     Supine to sit: Modified independent (Device/Increase time) Sit to supine: Modified independent (Device/Increase time)   General bed mobility comments: increased time and effort for bed mobility; able to perform without physical assist. Cueing for timing to allow for management of IV/cath with pt somewhat impulsive  Transfers Overall transfer level: Needs assistance Equipment used: Rolling walker (2 wheeled) Transfers: Sit to/from Stand Sit to Stand: Min guard         General transfer comment: Cueing for foot and hand placement; close CGA due to orthostatic vitals  Ambulation/Gait Ambulation/Gait assistance: Min guard Gait Distance (Feet): 1 Feet Assistive device: Rolling walker (2 wheeled) Gait Pattern/deviations: Step-to pattern     General Gait Details: pt motivated to ambulate today; ambualation deferred due to significant orthostatic vitals. Pt side-stepped ~7ft to get closer to [place]             Wheelchair Mobility    Modified Rankin (Stroke Patients Only)       Balance Overall balance assessment: Needs assistance Sitting-balance support: No upper extremity supported;Feet supported Sitting balance-Leahy Scale: Good Sitting balance - Comments: frequent, slow posterior LOB with limb movement; pt appeared to eventually self correct   Standing balance support: Bilateral upper extremity supported;During functional activity Standing balance-Leahy Scale: [place] Standing  balance comment: unable to assess secondary to  limited time in standing                            Cognition Arousal/Alertness: Awake/alert Behavior During Therapy: WFL for tasks assessed/performed Overall Cognitive Status: Within Functional Limits for tasks assessed                                 General Comments: increased interest in participating in today's session; less encouragement needed to participate      Exercises Total Joint Exercises Ankle Circles/Pumps: AROM;Both;5 reps Hip ABduction/ADduction: AROM;Strengthening;Both;10 reps Straight Leg Raises: AROM;Strengthening;Both;10 reps Long Arc Quad: AROM;Strengthening;Both;10 reps    General Comments General comments (skin integrity, edema, etc.): severe malnourishment, all joints and bones hightly prominant      Pertinent Vitals/Pain Pain Assessment: No/denies pain    Home Living                      Prior Function            PT Goals (current goals can now be found in the care plan section) Progress towards PT goals: Progressing toward goals    Frequency    Min 2X/week      PT Plan Current plan remains appropriate    Co-evaluation              AM-PAC PT "6 Clicks" Mobility   Outcome Measure  Help needed turning from your back to your side while in a flat bed without using bedrails?: None Help needed moving from lying on your back to sitting on the side of a flat bed without using bedrails?: None Help needed moving to and from a bed to a chair (including a wheelchair)?: A Little Help needed standing up from a chair using your arms (e.g., wheelchair or bedside chair)?: A Little Help needed to walk in hospital room?: A Lot Help needed climbing 3-5 steps with a railing? : Total 6 Click Score: 17    End of Session Equipment Utilized During Treatment: Gait belt Activity Tolerance: Treatment limited secondary to medical complications (Comment) (orthostasis) Patient left: in bed;with call bell/phone within  reach;with bed alarm set Nurse Communication: Mobility status PT Visit Diagnosis: Unsteadiness on feet (R26.81);Other abnormalities of gait and mobility (R26.89);Repeated falls (R29.6);Muscle weakness (generalized) (M62.81);History of falling (Z91.81);Difficulty in walking, not elsewhere classified (R26.2);Adult, failure to thrive (R62.7)     Time: 0277-4128 PT Time Calculation (min) (ACUTE ONLY): 25 min  Charges:                        Herbert Moors SPT 05/07/20, 5:19 PM

## 2020-05-07 NOTE — Progress Notes (Signed)
Urology Inpatient Progress Note  Subjective: No acute events overnight Afebrile, VSS SP tube placed yesterday, draining clear, yellow urine this morning Creatinine, WBC count stable today Wound culture pending with abundant gram-positive cocci, abundant gram-negative rods, and moderate gram-positive rods. He denies new pain  Anti-infectives: Anti-infectives (From admission, onward)   Start     Dose/Rate Route Frequency Ordered Stop   05/05/20 1500  levofloxacin (LEVAQUIN) tablet 500 mg     Discontinue    Note to Pharmacy: Penile shaft abscess, urethrocutaneous fistula ?   500 mg Oral Every other day 05/05/20 1441     05/02/20 1900  cefTRIAXone (ROCEPHIN) 1 g in sodium chloride 0.9 % 100 mL IVPB        1 g 200 mL/hr over 30 Minutes Intravenous  Once 05/02/20 1855 05/02/20 2059      Current Facility-Administered Medications  Medication Dose Route Frequency Provider Last Rate Last Admin  . 0.9 %  sodium chloride infusion   Intravenous Continuous Barb Merino, MD 125 mL/hr at 05/06/20 2335 New Bag at 05/06/20 2335  . acetaminophen (TYLENOL) tablet 650 mg  650 mg Oral Q6H PRN Barb Merino, MD       Or  . acetaminophen (TYLENOL) suppository 650 mg  650 mg Rectal Q6H PRN Barb Merino, MD      . albuterol (PROVENTIL) (2.5 MG/3ML) 0.083% nebulizer solution 2.5 mg  2.5 mg Nebulization Q2H PRN Barb Merino, MD      . aspirin EC tablet 81 mg  81 mg Oral Daily Barb Merino, MD   81 mg at 05/07/20 0953  . benztropine (COGENTIN) tablet 0.5 mg  0.5 mg Oral BID Barb Merino, MD   0.5 mg at 05/07/20 0955  . Chlorhexidine Gluconate Cloth 2 % PADS 6 each  6 each Topical Daily Barb Merino, MD   6 each at 05/06/20 [phone]  . colesevelam [facility]) tablet 1,875 mg  1,875 mg Oral BID WC Barb Merino, MD   1,875 mg at 05/07/20 0955  . docusate sodium (COLACE) capsule 100 mg  100 mg Oral BID PRN Barb Merino, MD      . DULoxetine (CYMBALTA) DR capsule 30 mg  30 mg Oral Daily Barb Merino, MD    30 mg at 05/07/20 0954  . folic acid (FOLVITE) tablet 1 mg  1 mg Oral Daily Barb Merino, MD   1 mg at 05/07/20 0954  . haloperidol (HALDOL) tablet 0.5 mg  0.5 mg Oral BID Barb Merino, MD   0.5 mg at 05/07/20 0955  . heparin injection 5,000 Units  5,000 Units Subcutaneous Q8H Barb Merino, MD   5,000 Units at 05/07/20 0603  . hydrALAZINE (APRESOLINE) injection 10 mg  10 mg Intravenous Q4H PRN Barb Merino, MD      . insulin aspart (novoLOG) injection 0-5 Units  0-5 Units Subcutaneous QHS Barb Merino, MD   4 Units at 05/02/20 2336  . insulin aspart (novoLOG) injection 0-9 Units  0-9 Units Subcutaneous TID WC Barb Merino, MD   2 Units at 05/07/20 0956  . insulin glargine (LANTUS) injection 8 Units  8 Units Subcutaneous Daily Barb Merino, MD   8 Units at 05/07/20 0955  . levofloxacin (LEVAQUIN) tablet 500 mg  500 mg Oral Maxwell Caul, MD   500 mg at 05/07/20 0953  . loperamide (IMODIUM) capsule 2 mg  2 mg Oral BID PRN Barb Merino, MD   2 mg at 05/06/20 2333  . metoCLOPramide (REGLAN) injection 5 mg  5 mg Intravenous Q6H  PRN Barb Merino, MD   5 mg at 05/03/20 1755  . multivitamin with minerals tablet 1 tablet  1 tablet Oral Daily Barb Merino, MD   1 tablet at 05/06/20 6213  . ondansetron (ZOFRAN) tablet 4 mg  4 mg Oral Q6H PRN Ralene Muskrat B, MD   4 mg at 05/06/20 2334   Or  . ondansetron (ZOFRAN) injection 4 mg  4 mg Intravenous Q6H PRN Ralene Muskrat B, MD   4 mg at 05/04/20 1004  . oxyCODONE (Oxy IR/ROXICODONE) immediate release tablet 5 mg  5 mg Oral Q4H PRN Barb Merino, MD   5 mg at 05/05/20 2119  . pantoprazole (PROTONIX) EC tablet 40 mg  40 mg Oral Daily Barb Merino, MD   40 mg at 05/07/20 0953  . protein supplement (ENSURE MAX) liquid  11 oz Oral BID Barb Merino, MD   11 oz at 05/07/20 1000  . sodium bicarbonate tablet 1,300 mg  1,300 mg Oral BID Barb Merino, MD   1,300 mg at 05/07/20 0954  . thiamine tablet 100 mg  100 mg Oral Daily  Barb Merino, MD   100 mg at 05/07/20 0953  . traZODone (DESYREL) tablet 25 mg  25 mg Oral QHS PRN Barb Merino, MD       Objective: Vital signs in last 24 hours: Temp:  [97.6 F (36.4 C)-98.2 F (36.8 C)] 98.1 F (36.7 C) (07/28 0741) Pulse Rate:  [67-82] 81 (07/28 0741) Resp:  [8-18] 15 (07/28 0741) BP: (74-146)/(52-100) 128/97 (07/28 0741) SpO2:  [98 %-100 %] 100 % (07/28 0741)  Intake/Output from previous day: 07/27 0701 - 07/28 0700 In: 3295 [P.O.:472; I.V.:2823] Out: 1225 [Urine:300; Drains:925] Intake/Output this shift: Total I/O In: 480 [P.O.:480] Out: 450 [Drains:450]  Physical Exam Vitals and nursing note reviewed.  Constitutional:      General: He is not in acute distress.    Appearance: He is not ill-appearing, toxic-appearing or diaphoretic.  HENT:     Head: Normocephalic and atraumatic.  Pulmonary:     Effort: Pulmonary effort is normal. No respiratory distress.  Genitourinary:    Comments: Decreased purulent output from penile wounds today.  Wound probed with a sterile Q-tip, continues to appear superficial and tracks approximately 5.5 cm toward the base of the penis.  Unable to probe deeper, reassuring for urethral involvement.  Stable bilateral testicular tenderness without fluctuance or crepitus. Skin:    General: Skin is warm and dry.  Neurological:     Mental Status: He is alert and oriented to person, place, and time.  Psychiatric:        Mood and Affect: Mood normal.        Behavior: Behavior normal.    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  Foley catheter removed, SP tube placed yesterday for urinary drainage and to allow for wound healing due  to concerns for risk of urethral erosion.  Decreased purulent drainage from the wound noted today.  Wound appears stable in depth and size with probing.  Labs reassuring for infection progression.  Agree with continued antibiotics.  Recommendations: -Continue antibiotics and follow wound cultures -We will continue daily wound checks  Debroah Loop, PA-C 05/07/2020

## 2020-05-08 LAB — GLUCOSE, CAPILLARY
Glucose-Capillary: 141 mg/dL — ABNORMAL HIGH (ref 70–99)
Glucose-Capillary: 176 mg/dL — ABNORMAL HIGH (ref 70–99)
Glucose-Capillary: 163 mg/dL — ABNORMAL HIGH (ref 70–99)
Glucose-Capillary: 117 mg/dL — ABNORMAL HIGH (ref 70–99)

## 2020-05-08 NOTE — Progress Notes (Signed)
Urology Inpatient Progress Note  Subjective: No acute events overnight Afebrile, VSS Labs stable  Anti-infectives: Anti-infectives (From admission, onward)   Start     Dose/Rate Route Frequency Ordered Stop   05/05/20 1500  levofloxacin (LEVAQUIN) tablet 500 mg     Discontinue    Note to Pharmacy: Penile shaft abscess, urethrocutaneous fistula ?   500 mg Oral Every other day 05/05/20 1441     05/02/20 1900  cefTRIAXone (ROCEPHIN) 1 g in sodium chloride 0.9 % 100 mL IVPB        1 g 200 mL/hr over 30 Minutes Intravenous  Once 05/02/20 1855 05/02/20 2059      Current Facility-Administered Medications  Medication Dose Route Frequency Provider Last Rate Last Admin  . acetaminophen (TYLENOL) tablet 650 mg  650 mg Oral Q6H PRN Barb Merino, MD       Or  . acetaminophen (TYLENOL) suppository 650 mg  650 mg Rectal Q6H PRN Barb Merino, MD      . albuterol (PROVENTIL) (2.5 MG/3ML) 0.083% nebulizer solution 2.5 mg  2.5 mg Nebulization Q2H PRN Barb Merino, MD      . aspirin EC tablet 81 mg  81 mg Oral Daily Barb Merino, MD   81 mg at 05/07/20 0953  . benztropine (COGENTIN) tablet 0.5 mg  0.5 mg Oral BID Barb Merino, MD   0.5 mg at 05/07/20 2157  . Chlorhexidine Gluconate Cloth 2 % PADS 6 each  6 each Topical Daily Barb Merino, MD   6 each at 05/07/20 1207  . colesevelam [facility]) tablet 1,875 mg  1,875 mg Oral BID WC Barb Merino, MD   1,875 mg at 05/07/20 1706  . docusate sodium (COLACE) capsule 100 mg  100 mg Oral BID PRN Barb Merino, MD      . DULoxetine (CYMBALTA) DR capsule 30 mg  30 mg Oral Daily Barb Merino, MD   30 mg at 05/07/20 0954  . folic acid (FOLVITE) tablet 1 mg  1 mg Oral Daily Barb Merino, MD   1 mg at 05/07/20 0954  . haloperidol (HALDOL) tablet 0.5 mg  0.5 mg Oral BID Barb Merino, MD   0.5 mg at 05/07/20 2158  . heparin injection 5,000 Units  5,000 Units Subcutaneous Q8H Barb Merino, MD   5,000 Units at 05/08/20 0600  . insulin aspart  (novoLOG) injection 0-5 Units  0-5 Units Subcutaneous QHS Barb Merino, MD   4 Units at 05/02/20 2336  . insulin aspart (novoLOG) injection 0-9 Units  0-9 Units Subcutaneous TID WC Barb Merino, MD   1 Units at 05/07/20 1707  . insulin glargine (LANTUS) injection 8 Units  8 Units Subcutaneous Daily Barb Merino, MD   8 Units at 05/07/20 0955  . levofloxacin (LEVAQUIN) tablet 500 mg  500 mg Oral Maxwell Caul, MD   500 mg at 05/07/20 0953  . loperamide (IMODIUM) capsule 2 mg  2 mg Oral BID PRN Barb Merino, MD   2 mg at 05/06/20 2333  . metoCLOPramide (REGLAN) injection 5 mg  5 mg Intravenous Q6H PRN Barb Merino, MD   5 mg at 05/03/20 1755  . midodrine (PROAMATINE) tablet 5 mg  5 mg Oral TID WC Fritzi Mandes, MD   5 mg at 05/07/20 1707  . multivitamin with minerals tablet 1 tablet  1 tablet Oral Daily Barb Merino, MD   1 tablet at 05/07/20 1234  . ondansetron (ZOFRAN) tablet 4 mg  4 mg Oral Q6H PRN Sidney Ace, MD  4 mg at 05/06/20 2334   Or  . ondansetron (ZOFRAN) injection 4 mg  4 mg Intravenous Q6H PRN Ralene Muskrat B, MD   4 mg at 05/04/20 1004  . pantoprazole (PROTONIX) EC tablet 40 mg  40 mg Oral Daily Barb Merino, MD   40 mg at 05/07/20 0953  . protein supplement (ENSURE MAX) liquid  11 oz Oral BID Barb Merino, MD   11 oz at 05/07/20 2200  . sodium bicarbonate tablet 1,300 mg  1,300 mg Oral BID Barb Merino, MD   1,300 mg at 05/07/20 2156  . thiamine tablet 100 mg  100 mg Oral Daily Barb Merino, MD   100 mg at 05/07/20 0953  . traZODone (DESYREL) tablet 25 mg  25 mg Oral QHS PRN Barb Merino, MD       Objective: Vital signs in last 24 hours: Temp:  [97.6 F (36.4 C)-98.4 F (36.9 C)] 98.2 F (36.8 C) (07/29 0757) Pulse Rate:  [75-85] 85 (07/29 0757) Resp:  [17-18] 17 (07/29 0757) BP: (99-137)/(66-91) 99/66 (07/29 0757) SpO2:  [98 %-100 %] 100 % (07/29 0757)  Intake/Output from previous day: 07/28 0701 - 07/29 0700 In: 960  [P.O.:960] Out: 2525 [Drains:2525] Intake/Output this shift: No intake/output data recorded.  Physical Exam Vitals and nursing note reviewed.  Constitutional:      General: He is not in acute distress.    Appearance: He is not ill-appearing, toxic-appearing or diaphoretic.  HENT:     Head: Normocephalic and atraumatic.  Pulmonary:     Effort: Pulmonary effort is normal. No respiratory distress.  Genitourinary:    Comments: Stable appearing ventral penile wounds, decreased drainage compared to prior.  No new induration, fluctuance, or crepitus.  No increased tenderness. Skin:    General: Skin is warm and dry.  Neurological:     Mental Status: He is alert and oriented to person, place, and time.  Psychiatric:        Mood and Affect: Mood normal.        Behavior: Behavior normal.    Lab Results:  Recent Labs    05/06/20 0516 05/07/20 0519  WBC 8.7 8.1  HGB 8.1* 7.9*  HCT 23.8* 24.4*  PLT 410* 462*   BMET Recent Labs    05/06/20 0516 05/07/20 0519  NA 134* 135  K 3.9 4.4  CL 112* 110  CO2 18* 16*  GLUCOSE 90 220*  BUN 36* 34*  CREATININE 2.59* 2.48*  CALCIUM 7.5* 7.7*   PT/INR Recent Labs    05/06/20 0934  LABPROT 13.9  INR 1.1   Assessment & Plan: 44 year old comorbid male with likely neurogenic bladder secondary to poorly managed diabetes admitted with a ventral penile abscess without signs of Fournier's gangren will e.  S/p SP tube placement, wound cultures with multiple species, progressive decrease in drainage from his penile wound.  Stable labs reassuring for infection progression.  Agree with continued antibiotics.  We will continue daily dressing changes with iodoform packing of the abscess.  Debroah Loop, PA-C 05/08/2020

## 2020-05-08 NOTE — Progress Notes (Signed)
[facility] NAME: Gregory Moody    MR#:  [id number]  DATE OF BIRTH:  [birth year]  SUBJECTIVE:  via [place] interpreter. Patient is weak and debilitated. Unable to provide self-care. Status post suprapubic catheter placement. Denies any complaints other than weakness and dizziness when he tries to walk.  Feels much better today. Eating well REVIEW OF SYSTEMS:   Review of Systems  Constitutional: Negative for chills, fever and weight loss.  HENT: Negative for ear discharge, ear pain and nosebleeds.   Eyes: Negative for blurred vision, pain and discharge.  Respiratory: Negative for sputum production, shortness of breath, wheezing and stridor.   Cardiovascular: Negative for chest pain, palpitations, orthopnea and PND.  Gastrointestinal: Negative for abdominal pain, diarrhea, nausea and vomiting.  Genitourinary: Negative for frequency and urgency.  Musculoskeletal: Negative for back pain and joint pain.  Neurological: Negative for sensory change, speech change, focal weakness and weakness.  Psychiatric/Behavioral: Negative for depression and hallucinations. The patient is not nervous/anxious.    Tolerating Diet:yes Tolerating PT: rec rehab  DRUG ALLERGIES:  No Known Allergies  VITALS:  Blood pressure 99/66, pulse 85, temperature 98.2 F (36.8 C), temperature source Oral, resp. rate 17, height 5\' 8"  (1.727 m), weight 45.4 kg, SpO2 100 %.  PHYSICAL EXAMINATION:   Physical Exam  GENERAL:  44 y.o.-year-old patient lying in the bed with no acute distress. Thin cachectic malnourished EYES: Pupils equal, round, reactive to light and accommodation. No scleral icterus.   HEENT: Head atraumatic, normocephalic. Oropharynx and nasopharynx clear.  NECK:  Supple, no jugular venous distention. No thyroid enlargement, no tenderness.  LUNGS: Normal breath sounds bilaterally, no wheezing, rales, rhonchi. No use of accessory muscles of  respiration.  CARDIOVASCULAR: S1, S2 normal. No murmurs, rubs, or gallops.  ABDOMEN: Soft, nontender, nondistended. Bowel sounds present. No organomegaly or mass.  EXTREMITIES: No cyanosis, clubbing or edema b/l.    NEUROLOGIC: Cranial nerves II through XII are intact. No focal Motor or sensory deficits b/l. Generalized ability PSYCHIATRIC:  patient is alert and oriented x 3.  SKIN: patient has bruises skin tears from previous injuries at home Pressure Injury 12/28/19 Elbow Left Stage 2 -  Partial thickness loss of dermis presenting as a shallow open injury with a red, pink wound bed without slough. (Active)  12/28/19 1422  Location: Elbow  Location Orientation: Left  Staging: Stage 2 -  Partial thickness loss of dermis presenting as a shallow open injury with a red, pink wound bed without slough.  Wound Description (Comments):   Present on Admission: Yes (Present on transfer to 1A)     Pressure Injury 12/28/19 Elbow Right Stage 3 -  Full thickness tissue loss. Subcutaneous fat may be visible but bone, tendon or muscle are NOT exposed. (Active)  12/28/19 1422  Location: Elbow  Location Orientation: Right  Staging: Stage 3 -  Full thickness tissue loss. Subcutaneous fat may be visible but bone, tendon or muscle are NOT exposed.  Wound Description (Comments):   Present on Admission: Yes (Present on transfer to 1A)        LABORATORY PANEL:  CBC Recent Labs  Lab 05/07/20 0519  WBC 8.1  HGB 7.9*  HCT 24.4*  PLT 462*    Chemistries  Recent Labs  Lab 05/02/20 1558 05/03/20 0407 05/05/20 0453 05/06/20 0516 05/07/20 0519  NA 127*   < > 133*   < > 135  K 4.3   < > 3.8   < >  4.4  CL 97*   < > 108   < > 110  CO2 21*   < > 20*   < > 16*  GLUCOSE 345*   < > 92   < > 220*  BUN 59*   < > 39*   < > 34*  CREATININE 3.51*   < > 2.70*   < > 2.48*  CALCIUM 8.7*   < > 7.2*   < > 7.7*  MG  --    < > 1.6*  --   --   AST 21  --   --   --   --   ALT <5  --   --   --   --   ALKPHOS 110   --   --   --   --   BILITOT 0.8  --   --   --   --    < > = values in this interval not displayed.   Cardiac Enzymes No results for input(s): TROPONINI in the last 168 hours. RADIOLOGY:  [place] Intraoperative  Result Date: 05/06/2020 CLINICAL DATA:  Ultrasound was provided for use by the ordering physician, and a technical charge was applied by the performing facility.  No radiologist interpretation/professional services rendered.   ASSESSMENT AND PLAN:  Gregory Santiago-Gonzalezis a 44 y.o.malewith PMH significant for diabetes mellitus, gastroparesis, CKD stage 4, chronic anemia, tuberculosis, COVID-19 infection July 2020, chronic diarrhea, urinary retention, unintentional weight loss, severe protein calorie malnutrition and recurrent admissions for uncontrolled diabetes as well as a previous admission to behavioral health unit due to poor self-care. Patient presented to the ED on 7/23  for evaluation of weakness and dizziness and inability to tolerate p.o and reports being unable to care for himself. He lives with his mother, denies any active alcohol or drug use.    Urinary retention, penile shaft abscess--s/p packing of abscess  No history of traumatic instrumentation. -Patient had a Foley catheter placed approximately a month ago -Urinary retention likely secondary to diabetic neuropathy, immobility. -Physical exam reveals abscess on the penile shaft that is draining, sent for cultures.   -On Levaquin. -7/27>>Seen by urology Dr brandon--now s/p suprapubic catheter with interventional radiology to bypass his urethra and to let penile abscess heal -7/29>> daily packing of the penile shaft  Generalized weakness, failure to thrive, Severe protein calorie malnutrition -Multifactorial: Poor oral intake, gastroparesis, uncontrolled diabetes, chronic diarrhea, medication nonadherence, poor self-care -albumin level low at 2.8. -Education officer, museum, diabetes coordinator and dietitian  involved. -PT/OT consult placed.  May have difficulty placement--due to being undocumented and no payor source  Insulin-dependent type II diabetes mellitus, Uncontrolled hyperglycemia -A1c 8.3 on 7/23. -Only on sliding scale insulin at home.  -on  Lantus 8 units daily and also continue sliding scale insulin.  Orthostatic hypotension appears due to diabetic autonomic neuropathy-will start patient on midodrine 5 mg TID   hyponatremia -Sodium level was low at 127 at presentation.   -Probably hypovolemic hyponatremia also with low solute intake and partly secondary to elevated blood glucose. -na 135  Chronic anemia, anemia of chronic disease. -Hemoglobin low at 8.6 at presentation.  Baseline with 9-10. -No active bleeding but hemoglobin down to 7.2 today. -Continue to monitor.  Transfuse if less than 7.  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  Dyslipidemia -Home meds include  Aspirin 81 mg daily, WelChol 1875 mg twice daily.   -Lipid panel 7/24 with HDL low at 20, LDL at 76.  Depression with Previous history of suicidal ideation -Denies suicidal ideation at this time.   -Home meds include Cogentin 0.5 mg twice daily, Cymbalta 30 mg daily, Haldol 0.5 mg daily.  Chronic diarrhea -Continue Imodium 2 mg twice daily as needed.  Gastroparesis due to DM-2 -Improving with Reglan.  Continue Protonix.  Chronic wounds--POA -Wound care consulted.   Procedures: suprapubic catheter placement Family communication : none Consults : urology CODE STATUS: full DVT Prophylaxis : heparin  Status is: Inpatient  Remains inpatient appropriate because:Hemodynamically unstable   Dispo:  Patient From: Home  Planned Disposition: most likely home. Patient has had challenges with  being undocumented, no payer source. Home health has refused to accept him given poor noncompliance and follow-up. Pt has suprapubic catheter and needs daily penile shaft dressing--not usre how he is going to manage it  expected discharge date: 2 to 3 days  medically stable for discharge: yes  TOTAL TIME TAKING CARE OF THIS PATIENT: *20* minutes.  >50% time spent on counselling and coordination of care  Note: This dictation was prepared with Dragon dictation along with smaller phrase technology. Any transcriptional errors that result from this process are unintentional.  Fritzi Mandes M.D    [facility]   CC: [facility]; [facility] ID: Gregory Moody, male   DOB: 25-Mar-1976, 44 y.o.   MRN: [id number]

## 2020-05-08 NOTE — Plan of Care (Signed)
  Problem: Education: Goal: Knowledge of General Education information will improve Description: Including pain rating scale, medication(s)/side effects and non-pharmacologic comfort measures Outcome: Progressing   Problem: Health Behavior/Discharge Planning: Goal: Ability to manage health-related needs will improve Outcome: Progressing   Problem: Clinical Measurements: Goal: Ability to maintain clinical measurements within normal limits will improve Outcome: Progressing Goal: Will remain free from infection Outcome: Progressing Goal: Diagnostic test results will improve Outcome: Progressing Goal: Respiratory complications will improve Outcome: Progressing Goal: Cardiovascular complication will be avoided Outcome: Progressing   Problem: Activity: Goal: Risk for activity intolerance will decrease Outcome: Progressing   Problem: Nutrition: Goal: Adequate nutrition will be maintained Outcome: Progressing   Problem: Coping: Goal: Level of anxiety will decrease Outcome: Progressing   Problem: Elimination: Goal: Will not experience complications related to bowel motility Outcome: Progressing Goal: Will not experience complications related to urinary retention Outcome: Progressing   Problem: Pain Managment: Goal: General experience of comfort will improve Outcome: Progressing   Problem: Safety: Goal: Ability to remain free from injury will improve Outcome: Progressing   Problem: Skin Integrity: Goal: Risk for impaired skin integrity will decrease Outcome: Progressing Pt has diarrhea x 1 episode full liquid stool.  Imodium given and pt states this is a chronic problem-Pt denies pain or new needs at this time

## 2020-05-09 ENCOUNTER — Inpatient Hospital Stay: Payer: Medicaid Other

## 2020-05-09 LAB — GLUCOSE, CAPILLARY
Glucose-Capillary: 141 mg/dL — ABNORMAL HIGH (ref 70–99)
Glucose-Capillary: 102 mg/dL — ABNORMAL HIGH (ref 70–99)
Glucose-Capillary: 95 mg/dL (ref 70–99)

## 2020-05-09 IMAGING — US US SCROTUM W/ DOPPLER COMPLETE
1 series · 13 of 25 positions shown · non-contrast
Comparison: None.

CLINICAL DATA: Penile abscess.  Assess for scrotal extension.

EXAM:
SCROTAL ULTRASOUND
DOPPLER ULTRASOUND OF THE TESTICLES
TECHNIQUE: Complete ultrasound examination of the testicles, epididymis, and
other scrotal structures was performed. Color and spectral Doppler
ultrasound were also utilized to evaluate blood flow to the
testicles.

[Series 1: us scrotum w/doppler · 75 acquisitions, 13 frames shown]
[im 1/75]
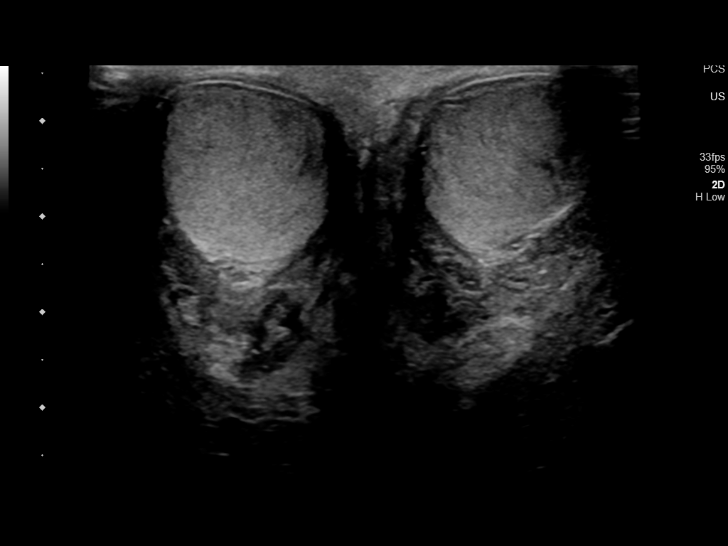
[im 7/75]
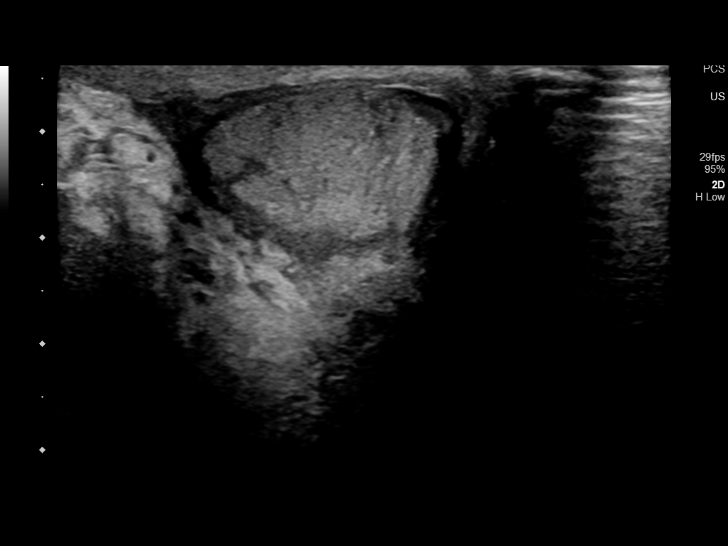
[im 13/75]
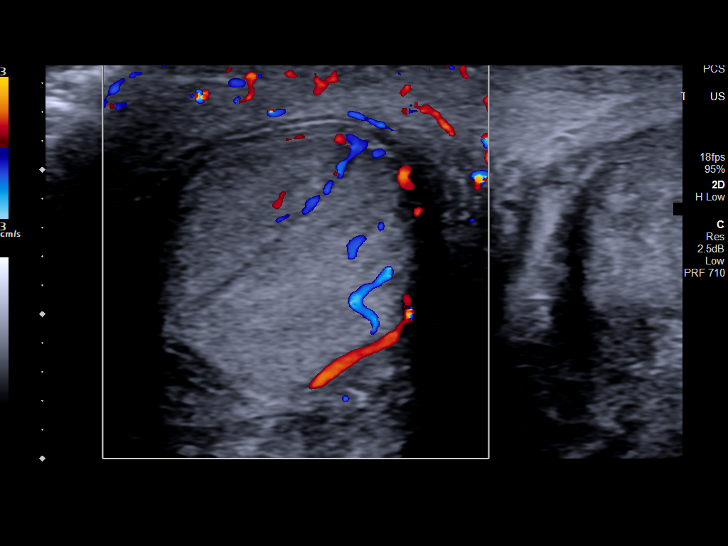
[im 19/75]
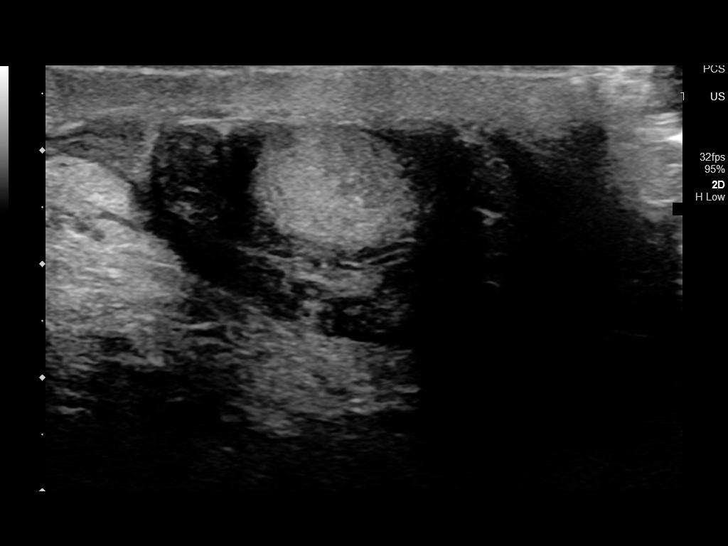
[im 25/75]
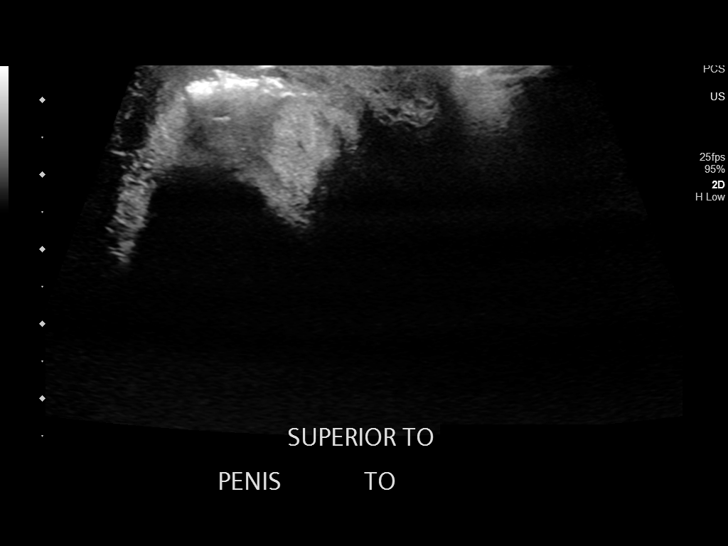
[im 31/75]
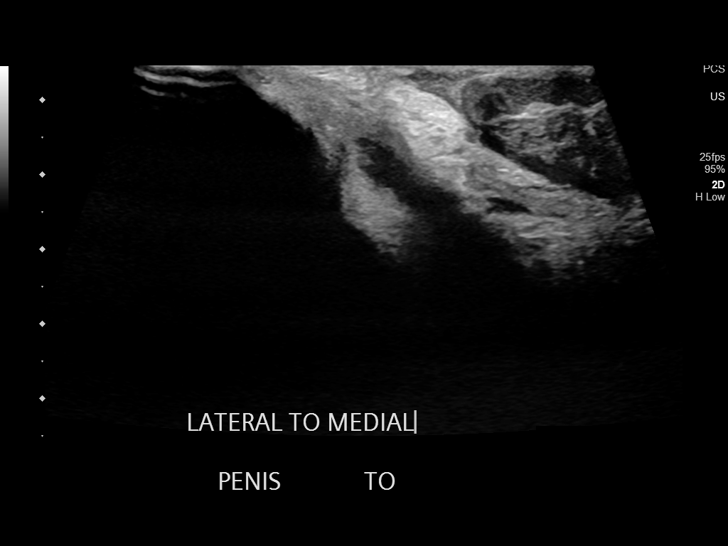
[im 38/75]
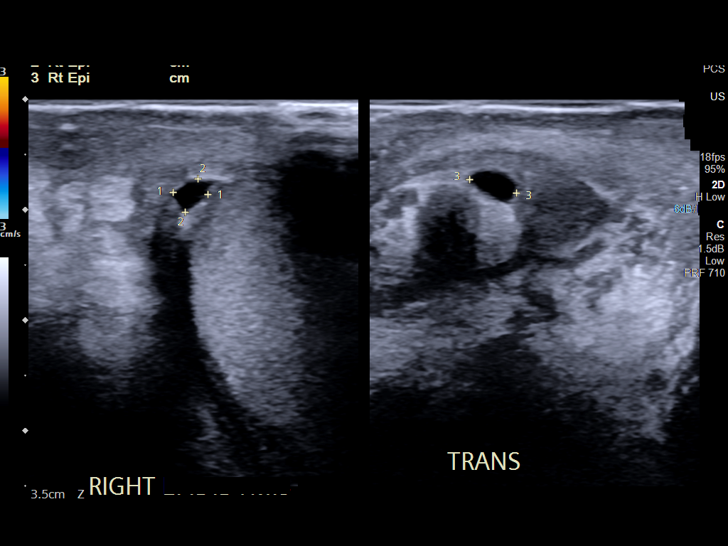
[im 44/75]
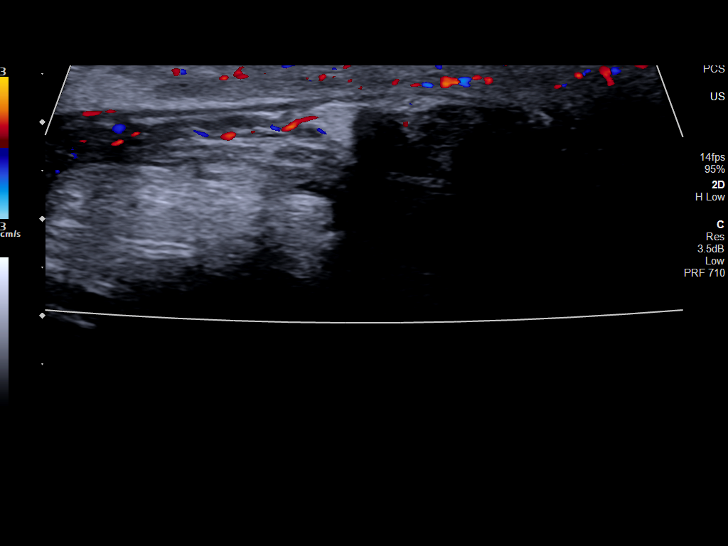
[im 50/75]
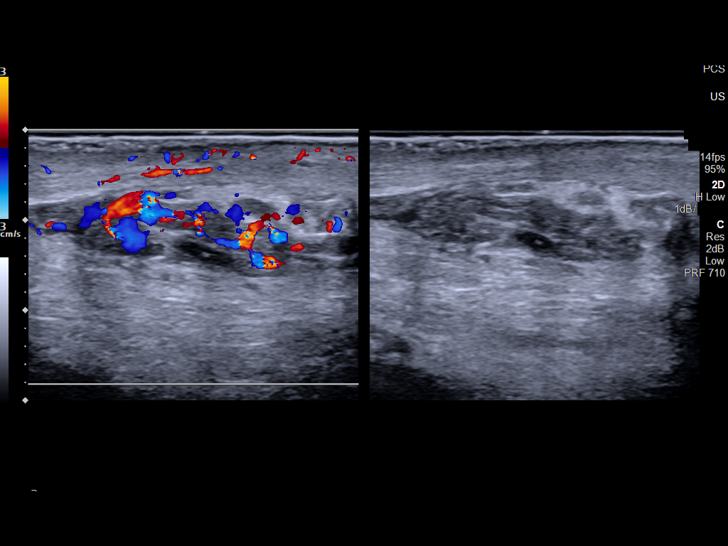
[im 56/75]
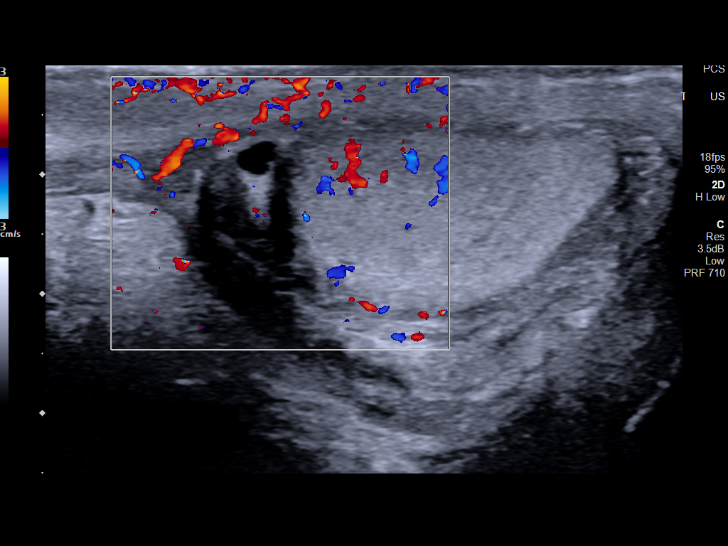
[im 62/75]
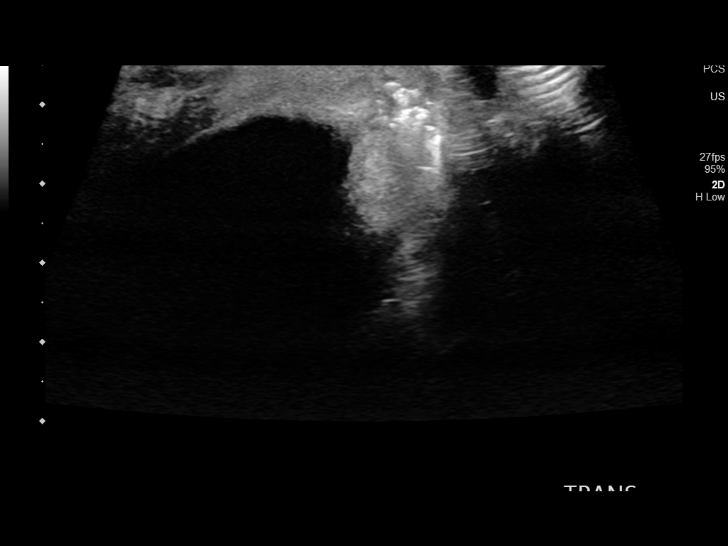
[im 68/75]
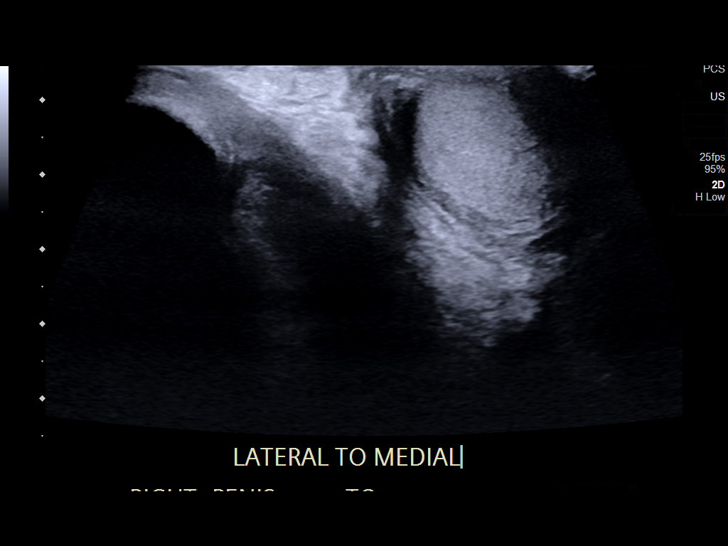
[im 75/75]
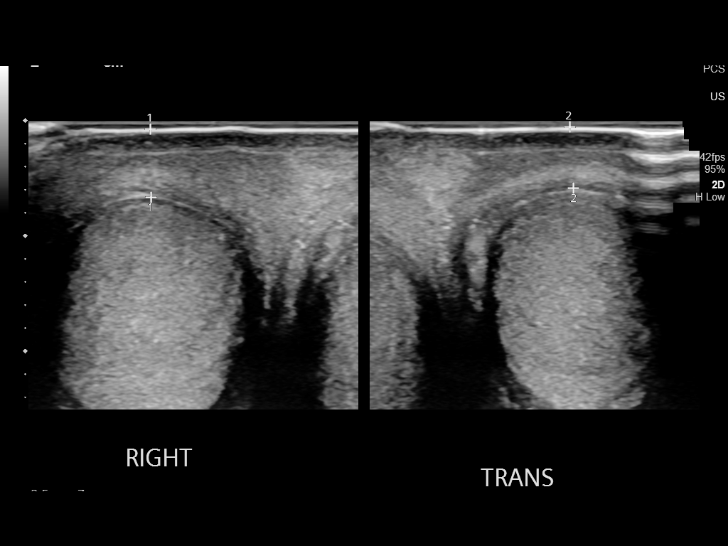

[13 of 25 positions shown; findings below may reference images not displayed]

FINDINGS: Right testicle

Measurements: 3.0 x 1.8 x 1.9 cm. Prominent rete testis, a normal
variant. No hyperemia or focal lesion.

Left testicle

Measurements: 3.0 x 1.8 x 2.0 cm. Prominent rete testis, a normal
variant. No hyperemia or focal lesion.

Right epididymis: 0.6 x 1.1 x 1.3 cm. 4 mm epididymal cyst. No gross
hyperemia.

Left epididymis:  0.8 x 0.9 x 1.5 cm.  No hyperemia.

Hydrocele:  None visualized.

Varicocele:  None visualized.

Pulsed Doppler interrogation of both testes demonstrates normal low
resistance arterial and venous waveforms bilaterally.

Echogenic focus within the soft tissues at the base of the penis
consistent with air from known penile abscess.
IMPRESSION: No sign of inflammatory disease or abscess affecting the testicles
or epididymi. No hydrocele or intra scrotal abscess.

## 2020-05-09 MED ORDER — GLUCERNA SHAKE PO LIQD
237.0000 mL | Freq: Two times a day (BID) | ORAL | Status: DC
  Administered 2020-05-09: 237 mL via ORAL

## 2020-05-09 MED ORDER — LEVOFLOXACIN 500 MG PO TABS
750.0000 mg | ORAL_TABLET | ORAL | Status: DC
  Administered 2020-05-11 – 2020-05-13 (×2): 750 mg via ORAL
  Filled 2020-05-09 (×3): qty 2

## 2020-05-09 MED ORDER — MIDODRINE HCL 5 MG PO TABS: 5.0000 mg | ORAL_TABLET | Freq: Two times a day (BID) | ORAL | Status: DC

## 2020-05-09 MED ORDER — GLUCERNA SHAKE PO LIQD
237.0000 mL | Freq: Three times a day (TID) | ORAL | Status: DC
  Administered 2020-05-11: 237 mL via ORAL

## 2020-05-09 NOTE — Plan of Care (Signed)
  Problem: Urinary Elimination: Goal: Signs and symptoms of infection will decrease Outcome: Progressing Note: Catheter care reviewed

## 2020-05-09 NOTE — Progress Notes (Signed)
[facility] [place] NAME: Rad Gramling    MR#:  [id number]  DATE OF BIRTH:  September 02, 1976  SUBJECTIVE:  via [place] interpreter. Patient is weak and debilitated. Unable to provide self-care. Status post suprapubic catheter placement. Denies any complaints other than weakness and dizziness when he tries to walk.  Feels much better today. Eating well Does not like to participate with PT much--remains inactive REVIEW OF SYSTEMS:   Review of Systems  Constitutional: Negative for chills, fever and weight loss.  HENT: Negative for ear discharge, ear pain and nosebleeds.   Eyes: Negative for blurred vision, pain and discharge.  Respiratory: Negative for sputum production, shortness of breath, wheezing and stridor.   Cardiovascular: Negative for chest pain, palpitations, orthopnea and PND.  Gastrointestinal: Negative for abdominal pain, diarrhea, nausea and vomiting.  Genitourinary: Negative for frequency and urgency.  Musculoskeletal: Negative for back pain and joint pain.  Neurological: Negative for sensory change, speech change, focal weakness and weakness.  Psychiatric/Behavioral: Negative for depression and hallucinations. The patient is not nervous/anxious.    Tolerating Diet:yes Tolerating PT: rec rehab  DRUG ALLERGIES:  No Known Allergies  VITALS:  Blood pressure (!) 142/99, pulse 81, temperature 98.2 F (36.8 C), temperature source Oral, resp. rate 19, height 5\' 8"  (1.727 m), weight 45.4 kg, SpO2 100 %.  PHYSICAL EXAMINATION:   Physical Exam  GENERAL:  44 y.o.-year-old patient lying in the bed with no acute distress. Thin cachectic malnourished EYES: Pupils equal, round, reactive to light and accommodation. No scleral icterus.   HEENT: Head atraumatic, normocephalic. Oropharynx and nasopharynx clear.  NECK:  Supple, no jugular venous distention. No thyroid enlargement, no tenderness.  LUNGS: Normal breath sounds  bilaterally, no wheezing, rales, rhonchi. No use of accessory muscles of respiration.  CARDIOVASCULAR: S1, S2 normal. No murmurs, rubs, or gallops.  ABDOMEN: Soft, nontender, nondistended. Bowel sounds present. No organomegaly or mass.  EXTREMITIES: No cyanosis, clubbing or edema b/l.    NEUROLOGIC: Cranial nerves II through XII are intact. No focal Motor or sensory deficits b/l. Generalized ability PSYCHIATRIC:  patient is alert and oriented x 3.  SKIN: patient has bruises skin tears from previous injuries at home Pressure Injury 12/28/19 Elbow Left Stage 2 -  Partial thickness loss of dermis presenting as a shallow open injury with a red, pink wound bed without slough. (Active)  12/28/19 1422  Location: Elbow  Location Orientation: Left  Staging: Stage 2 -  Partial thickness loss of dermis presenting as a shallow open injury with a red, pink wound bed without slough.  Wound Description (Comments):   Present on Admission: Yes (Present on transfer to 1A)     Pressure Injury 12/28/19 Elbow Right Stage 3 -  Full thickness tissue loss. Subcutaneous fat may be visible but bone, tendon or muscle are NOT exposed. (Active)  12/28/19 1422  Location: Elbow  Location Orientation: Right  Staging: Stage 3 -  Full thickness tissue loss. Subcutaneous fat may be visible but bone, tendon or muscle are NOT exposed.  Wound Description (Comments):   Present on Admission: Yes (Present on transfer to 1A)        LABORATORY PANEL:  CBC Recent Labs  Lab 05/07/20 0519  WBC 8.1  HGB 7.9*  HCT 24.4*  PLT 462*    Chemistries  Recent Labs  Lab 05/02/20 1558 05/03/20 0407 05/05/20 0453 05/06/20 0516 05/07/20 0519  NA 127*   < > 133*   < > 135  K 4.3   < > 3.8   < > 4.4  CL 97*   < > 108   < > 110  CO2 21*   < > 20*   < > 16*  GLUCOSE 345*   < > 92   < > 220*  BUN 59*   < > 39*   < > 34*  CREATININE 3.51*   < > 2.70*   < > 2.48*  CALCIUM 8.7*   < > 7.2*   < > 7.7*  MG  --    < > 1.6*  --   --    AST 21  --   --   --   --   ALT <5  --   --   --   --   ALKPHOS 110  --   --   --   --   BILITOT 0.8  --   --   --   --    < > = values in this interval not displayed.   Cardiac Enzymes No results for input(s): TROPONINI in the last 168 hours. RADIOLOGY:  US SCROTUM W/DOPPLER  Result Date: 05/09/2020 CLINICAL DATA:  Penile abscess.  Assess for scrotal extension. EXAM: SCROTAL ULTRASOUND DOPPLER ULTRASOUND OF THE TESTICLES TECHNIQUE: Complete ultrasound examination of the testicles, epididymis, and other scrotal structures was performed. Color and spectral Doppler ultrasound were also utilized to evaluate blood flow to the testicles. COMPARISON:  None. FINDINGS: Right testicle Measurements: 3.0 x 1.8 x 1.9 cm. Prominent rete testis, a normal variant. No hyperemia or focal lesion. Left testicle Measurements: 3.0 x 1.8 x 2.0 cm. Prominent rete testis, a normal variant. No hyperemia or focal lesion. Right epididymis: 0.6 x 1.1 x 1.3 cm. 4 mm epididymal cyst. No gross hyperemia. Left epididymis:  0.8 x 0.9 x 1.5 cm.  No hyperemia. Hydrocele:  None visualized. Varicocele:  None visualized. Pulsed Doppler interrogation of both testes demonstrates normal low resistance arterial and venous waveforms bilaterally. Echogenic focus within the soft tissues at the base of the penis consistent with air from known penile abscess. IMPRESSION: No sign of inflammatory disease or abscess affecting the testicles or epididymi. No hydrocele or intra scrotal abscess. Electronically Signed   By: Nelson Chimes M.D.   On: 05/09/2020 11:41   ASSESSMENT AND PLAN:  Ramari Santiago-Gonzalezis a 44 y.o.malewith PMH significant for diabetes mellitus, gastroparesis, CKD stage 4, chronic anemia, tuberculosis, COVID-19 infection July 2020, chronic diarrhea, urinary retention, unintentional weight loss, severe protein calorie malnutrition and recurrent admissions for uncontrolled diabetes as well as a previous admission to behavioral  health unit due to poor self-care. Patient presented to the ED on 7/23  for evaluation of weakness and dizziness and inability to tolerate p.o and reports being unable to care for himself. He lives with his mother, denies any active alcohol or drug use.    Urinary retention, penile shaft abscess--s/p packing of abscess  No history of traumatic instrumentation. -Patient had a Foley catheter placed approximately a month ago -Urinary retention likely secondary to diabetic neuropathy, immobility. -Physical exam reveals abscess on the penile shaft that is draining, sent for cultures.   -On Levaquin. -7/27>>Seen by urology Dr brandon--now s/p suprapubic catheter with interventional radiology to bypass his urethra and to let penile abscess heal -7/29>> daily packing of the penile shaft  Generalized weakness, failure to thrive, Severe protein calorie malnutrition -Multifactorial: Poor oral intake, gastroparesis, uncontrolled diabetes, chronic diarrhea, medication nonadherence, poor self-care -albumin level low at  2.8. -Social worker, diabetes coordinator and dietitian involved. -PT/OT consult placed.  May have difficulty placement--due to being undocumented and no payor source  Insulin-dependent type II diabetes mellitus, Uncontrolled hyperglycemia -A1c 8.3 on 7/23. -Only on sliding scale insulin at home.  -on  Lantus 8 units daily and also continue sliding scale insulin.  Orthostatic hypotension appears due to diabetic autonomic neuropathy- BP going up-- hold midodrine  hyponatremia -Sodium level was low at 127 at presentation.   -Probably hypovolemic hyponatremia also with low solute intake and partly secondary to elevated blood glucose. -na 135  Chronic anemia, anemia of chronic disease. -Hemoglobin low at 8.6 at presentation.  Baseline with 9-10. -No active bleeding but hemoglobin down to 7.2 today. -Continue to monitor.  Transfuse if less than 7.  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  Dyslipidemia -Home meds include Aspirin 81 mg daily, WelChol 1875 mg twice daily.   -Lipid panel 7/24 with HDL low at 20, LDL at 76.  Depression with Previous history of suicidal ideation -Denies suicidal ideation at this time.   -Home meds include Cogentin 0.5 mg twice daily, Cymbalta 30 mg daily, Haldol 0.5 mg daily.  Chronic diarrhea -Continue Imodium 2 mg twice daily as needed.  Gastroparesis due to DM-2 -Improving with Reglan.  Continue Protonix.  Chronic wounds--POA -Wound care consulted.   Procedures: suprapubic catheter placement Family communication : none Consults : urology CODE STATUS: full DVT Prophylaxis : heparin  Status is: Inpatient  Dispo:  Patient From: Home  Planned Disposition: most likely home. Patient has had challenges with being undocumented, no payer source. Home health has refused to accept him given poor noncompliance and follow-up. Pt has suprapubic catheter and needs daily penile shaft dressing--not sure how he is going to manage it.  Per [facility] charity HH can only help few times--pt has poor social situation and no one in the family is ready to help him (per pt)--CHALLENGING discharge!    medically stable for discharge: yes  TOTAL TIME TAKING CARE OF THIS PATIENT: *20* minutes.  >50% time spent on counselling and coordination of care  Note: This dictation was prepared with Dragon dictation along with smaller phrase technology. Any transcriptional errors that result from this process are unintentional.  Fritzi Mandes M.D    [facility]   CC: [facility] care physician; [facility] ID: Dondrell Loudermilk, male   DOB: 05/20/1976, 44 y.o.   MRN: [id number]

## 2020-05-09 NOTE — Progress Notes (Signed)
Urology Inpatient Progress Note  Subjective: No acute events overnight Afebrile, VSS Wound culture resulted with multiple organisms present, none predominant, no anaerobes.  Blood cultures resulted with no growth at 5 days.  On antibiotics as below. No acute concerns today  Anti-infectives: Anti-infectives (From admission, onward)   Start     Dose/Rate Route Frequency Ordered Stop   05/11/20 1000  levofloxacin (LEVAQUIN) tablet 750 mg     Discontinue    Note to Pharmacy: Penile shaft abscess, urethrocutaneous fistula ?   750 mg Oral Every other day 05/09/20 0837     05/05/20 1500  levofloxacin (LEVAQUIN) tablet 500 mg  Status:  Discontinued       Note to Pharmacy: Penile shaft abscess, urethrocutaneous fistula ?   500 mg Oral Every other day 05/05/20 1441 05/09/20 0837   05/02/20 1900  cefTRIAXone (ROCEPHIN) 1 g in sodium chloride 0.9 % 100 mL IVPB        1 g 200 mL/hr over 30 Minutes Intravenous  Once 05/02/20 1855 05/02/20 2059      Current Facility-Administered Medications  Medication Dose Route Frequency Provider Last Rate Last Admin  . acetaminophen (TYLENOL) tablet 650 mg  650 mg Oral Q6H PRN Barb Merino, MD       Or  . acetaminophen (TYLENOL) suppository 650 mg  650 mg Rectal Q6H PRN Barb Merino, MD      . albuterol (PROVENTIL) (2.5 MG/3ML) 0.083% nebulizer solution 2.5 mg  2.5 mg Nebulization Q2H PRN Barb Merino, MD      . aspirin EC tablet 81 mg  81 mg Oral Daily Barb Merino, MD   81 mg at 05/09/20 0831  . benztropine (COGENTIN) tablet 0.5 mg  0.5 mg Oral BID Barb Merino, MD   0.5 mg at 05/08/20 2303  . Chlorhexidine Gluconate Cloth 2 % PADS 6 each  6 each Topical Daily Barb Merino, MD   6 each at 05/08/20 1100  . colesevelam [facility]) tablet 1,875 mg  1,875 mg Oral BID WC Barb Merino, MD   1,875 mg at 05/09/20 0857  . docusate sodium (COLACE) capsule 100 mg  100 mg Oral BID PRN Barb Merino, MD      . DULoxetine (CYMBALTA) DR capsule 30 mg  30 mg  Oral Daily Barb Merino, MD   30 mg at 05/08/20 1028  . folic acid (FOLVITE) tablet 1 mg  1 mg Oral Daily Barb Merino, MD   1 mg at 05/09/20 0831  . haloperidol (HALDOL) tablet 0.5 mg  0.5 mg Oral BID Barb Merino, MD   0.5 mg at 05/08/20 2304  . heparin injection 5,000 Units  5,000 Units Subcutaneous Q8H Barb Merino, MD   5,000 Units at 05/09/20 0603  . insulin aspart (novoLOG) injection 0-5 Units  0-5 Units Subcutaneous QHS Barb Merino, MD   4 Units at 05/02/20 2336  . insulin aspart (novoLOG) injection 0-9 Units  0-9 Units Subcutaneous TID WC Barb Merino, MD   1 Units at 05/09/20 [phone]  . insulin glargine (LANTUS) injection 8 Units  8 Units Subcutaneous Daily Barb Merino, MD   8 Units at 05/09/20 [phone]  . [START ON 05/11/2020] levofloxacin (LEVAQUIN) tablet 750 mg  750 mg Oral QODAY Rocky Morel, RPH      . loperamide (IMODIUM) capsule 2 mg  2 mg Oral BID PRN Barb Merino, MD   2 mg at 05/09/20 0858  . metoCLOPramide (REGLAN) injection 5 mg  5 mg Intravenous Q6H PRN Barb Merino, MD  5 mg at 05/03/20 1755  . midodrine (PROAMATINE) tablet 5 mg  5 mg Oral TID WC Fritzi Mandes, MD   5 mg at 05/09/20 0938  . multivitamin with minerals tablet 1 tablet  1 tablet Oral Daily Barb Merino, MD   1 tablet at 05/08/20 1233  . ondansetron (ZOFRAN) tablet 4 mg  4 mg Oral Q6H PRN Ralene Muskrat B, MD   4 mg at 05/08/20 2012   Or  . ondansetron (ZOFRAN) injection 4 mg  4 mg Intravenous Q6H PRN Ralene Muskrat B, MD   4 mg at 05/04/20 1004  . pantoprazole (PROTONIX) EC tablet 40 mg  40 mg Oral Daily Barb Merino, MD   40 mg at 05/09/20 0900  . protein supplement (ENSURE MAX) liquid  11 oz Oral BID Barb Merino, MD   11 oz at 05/08/20 2305  . sodium bicarbonate tablet 1,300 mg  1,300 mg Oral BID Barb Merino, MD   1,300 mg at 05/08/20 2302  . thiamine tablet 100 mg  100 mg Oral Daily Barb Merino, MD   100 mg at 05/09/20 0831  . traZODone (DESYREL) tablet 25 mg  25 mg Oral QHS  PRN Barb Merino, MD       Objective: Vital signs in last 24 hours: Temp:  [97.8 F (36.6 C)-98.6 F (37 C)] 98.2 F (36.8 C) (07/30 0754) Pulse Rate:  [80-81] 81 (07/30 0754) Resp:  [17-19] 19 (07/30 0754) BP: (138-152)/(98-99) 142/99 (07/30 0754) SpO2:  [100 %] 100 % (07/30 0754)  Intake/Output from previous day: 07/29 0701 - 07/30 0700 In: 21 [P.O.:720] Out: 2250 [Urine:300; Drains:1950] Intake/Output this shift: Total I/O In: 480 [P.O.:480] Out: -   Physical Exam Vitals and nursing note reviewed.  Constitutional:      General: He is not in acute distress.    Appearance: He is not ill-appearing, toxic-appearing or diaphoretic.  HENT:     Head: Normocephalic and atraumatic.  Pulmonary:     Effort: Pulmonary effort is normal. No respiratory distress.  Genitourinary:    Comments: Dual draining sinuses appear stable on physical exam today.  Purulent output noted to be significantly more runny in quality than prior.  No new edema, erythema, tenderness, crepitus, or fluctuance.  There was noted to be increased purulent output with palpation of the right testicle. Skin:    General: Skin is warm and dry.  Neurological:     Mental Status: He is alert and oriented to person, place, and time.  Psychiatric:        Mood and Affect: Mood normal.        Behavior: Behavior normal.    Lab Results:  Recent Labs    05/07/20 0519  WBC 8.1  HGB 7.9*  HCT 24.4*  PLT 462*   BMET Recent Labs    05/07/20 0519  NA 135  K 4.4  CL 110  CO2 16*  GLUCOSE 220*  BUN 34*  CREATININE 2.48*  CALCIUM 7.7*   PT/INR Recent Labs    05/06/20 0934  LABPROT 13.9  INR 1.1   Assessment & Plan: 44 year old comorbid male with likely neurogenic bladder secondary to poorly managed diabetes admitted with a ventral penile abscess without signs of Fournier's gangrene, now s/p SP tube placement.  Daily dressing change performed at the bedside today.  Iodoform packing from yesterday had  already been removed per patient report.  I replaced quarter inch iodoform packing deep into the drainage cavity, making sure not to pack too tightly so  as to maintain blood flow to the surrounding tissue.  Patient tolerated well.  Patient denies increased pain and he remains afebrile, however increased drainage with palpation of the right testicle is concerning for possible abscess tracking into the scrotum.  Will obtain scrotal ultrasound today for further evaluation.  If abscess is tracking into deeper structures, he may require counterincision for drainage.  Recommendations: -Scrotal ultrasound today -Continue antibiotics -Continue daily dressing changes and light packing with 1/4 inch iodoform gauze  Debroah Loop, PA-C 05/09/2020

## 2020-05-09 NOTE — Progress Notes (Signed)
PHARMACY NOTE:  ANTIMICROBIAL RENAL DOSAGE ADJUSTMENT  Current antimicrobial regimen includes a mismatch between antimicrobial dosage and estimated renal function.  As per policy approved by the Pharmacy & Therapeutics and Medical Executive Committees, the antimicrobial dosage will be adjusted accordingly.  Current antimicrobial dosage:  Levaquin 500 mg PO QOD  Indication: penile shaft abscess  Renal Function:  Estimated Creatinine Clearance: 24.2 mL/min (A) (by C-G formula based on SCr of 2.48 mg/dL (H)).    Antimicrobial dosage has been changed to:  Levaquin 750 mg PO QOD  Additional comments: SCr 3.51 >>2.48   Thank you for allowing pharmacy to be a part of this patient's care.  Rocky Morel, [facility] 05/09/2020 8:35 AM

## 2020-05-09 NOTE — Progress Notes (Signed)
Nutrition Follow-up  DOCUMENTATION CODES:   Severe malnutrition in context of chronic illness  INTERVENTION:   Glucerna Shake po TID, each supplement provides 220 kcal and 10 grams of protein  MVI daily   NUTRITION DIAGNOSIS:   Severe Malnutrition related to chronic illness (uncontrolled DM as well as social environmental factors) as evidenced by severe fat depletion, severe muscle depletion.  GOAL:   Patient will meet greater than or equal to 90% of their needs -progressing   MONITOR:   PO intake, Supplement acceptance, Labs, Weight trends, Skin, I & O's  REASON FOR ASSESSMENT:   Malnutrition Screening Tool, Consult Assessment of nutrition requirement/status  ASSESSMENT:   44 year old male with past medical history of IDDM complicated by gastroparesis, CKD, anemia, TB, COVID-19 infection (07/20), and multiple admissions due to uncontrolled diabetes, urinary retention, and malnutrition presented with weakness, dizziness, inability to tolerate po and reports being unable to care for himself.  Pt continues to have good appetite and oral intake; pt eating 50-100% of meals and drinking Ensure and Glucerna supplements. Ensure changed over to Glucerna by MD; RD will increase Glucerna to 3 times daily. No new weight since admit; RD will request weekly weights. Pt does not currently have safe plan for discharge.   Medications reviewed and include: aspirin, folic acid, heparin, insulin, MVI, Na bicarbonate, thiamine   Labs reviewed: BUN 34(H), creat 2.48(H) P 3.0 wnl, Mg 1.6(L)- 7/26 Hgb 7.9(L), Hct 24.4(L) cbgs- 141, 176, 163, 117, 141 x 24 hrs  Nutrition-Focused physical exam completed. Findings are severe fat depletions, severe muscle depletions, and no edema.   Diet Order:   Diet Order            Diet Carb Modified Fluid consistency: Thin; Room service appropriate? Yes  Diet effective now                EDUCATION NEEDS:   Education needs have been addressed  Skin:   Skin Assessment: Reviewed RN Assessment  Last BM:  7/30- type 7  Height:   Ht Readings from Last 1 Encounters:  05/02/20 5\' 8"  (1.727 m)    Weight:   Wt Readings from Last 1 Encounters:  05/02/20 45.4 kg    BMI:  Body mass index is 15.2 kg/m.  Estimated Nutritional Needs:   Kcal:  1800-2100kcal/day  Protein:  90-100g/day  Fluid:  1.4-1.6L/day  Koleen Distance MS, RD, LDN Please refer to [facility] for RD and/or RD on-call/weekend/after hours pager

## 2020-05-10 DIAGNOSIS — N4821 Abscess of corpus cavernosum and penis: Secondary | ICD-10-CM

## 2020-05-10 DIAGNOSIS — N319 Neuromuscular dysfunction of bladder, unspecified: Secondary | ICD-10-CM

## 2020-05-10 LAB — GLUCOSE, CAPILLARY
Glucose-Capillary: 131 mg/dL — ABNORMAL HIGH (ref 70–99)
Glucose-Capillary: 112 mg/dL — ABNORMAL HIGH (ref 70–99)
Glucose-Capillary: 94 mg/dL (ref 70–99)
Glucose-Capillary: 146 mg/dL — ABNORMAL HIGH (ref 70–99)
Glucose-Capillary: 240 mg/dL — ABNORMAL HIGH (ref 70–99)
Glucose-Capillary: 281 mg/dL — ABNORMAL HIGH (ref 70–99)

## 2020-05-10 LAB — AEROBIC/ANAEROBIC CULTURE W GRAM STAIN (SURGICAL/DEEP WOUND)

## 2020-05-10 NOTE — Progress Notes (Signed)
Physical Therapy Treatment Patient Details Name: Gregory Moody MRN: [id number] DOB: [birth year] Today's Date: 05/10/2020    History of Present Illness Gregory Moody is a 44 y.o. male with medical history significant of diabetes mellitus, GERD, gastroparesis, DKA, anemia, tobacco abuse, esophageal candidiasis, CKD, malnutrition COVID-19 infection 04/23/2019, C. difficile colitis, and multiple admissions to the ED as well as the the behavioral health unit this year who presents for weakness and dizziness.    PT Comments    Pt was supine in bed with HOB elevated ~ 30 degrees. He is agreeable to trial OOB but did report his BP has ben low. BP in supine 114/83. Upon sitting up EOB without assistance, BP dropped to 84/62. He reports dizziness and after ~ 2 minutes seated EOB request to return to supine. He did perform there ex in bed after attempt to get OOB. Overall session and PT has been limited by orthostatic hypotension. Acute PT will continue efforts and progress pt as able per pt tolerance. Recommend DC to SNF to address deficits with strength, transfers, and safe functional mobility. Rn aware of pts abilities and that 600 ML drained from cath during session. He was in bed with bed alarm in place and call bell in reach at conclusion of session.     Follow Up Recommendations  SNF     Equipment Recommendations  Rolling walker with 5" wheels    Recommendations for Other Services       Precautions / Restrictions Precautions Precautions: Fall Restrictions Weight Bearing Restrictions: No Other Position/Activity Restrictions: 7/27 placement of SP cath    Mobility  Bed Mobility Overal bed mobility: Modified Independent Bed Mobility: Supine to Sit;Sit to Supine     Supine to sit: Modified independent (Device/Increase time) Sit to supine: Modified independent (Device/Increase time)   General bed mobility comments: Pt BP in supine: 114/83 upon sitting up EOB 84/62 and pt  reports dizziness requesting to return to supine  Transfers Overall transfer level: Needs assistance               General transfer comment: unable to progress 2/2 to orthostatic hypotension  Ambulation/Gait                 Stairs             Wheelchair Mobility    Modified Rankin (Stroke Patients Only)       Balance Overall balance assessment: Needs assistance Sitting-balance support: No upper extremity supported;Feet supported Sitting balance-Leahy Scale: Good Sitting balance - Comments: no LOB in sitting at EOB x ~ 2 minutes                                    Cognition Arousal/Alertness: Awake/alert Behavior During Therapy: WFL for tasks assessed/performed Overall Cognitive Status: Within Functional Limits for tasks assessed                                 General Comments: Pt is agreeable to trial OOB activity however limited by orthostatic hypotension      Exercises Total Joint Exercises Ankle Circles/Pumps: AROM;20 reps;Both Hip ABduction/ADduction: AROM;Strengthening;Both;10 reps Straight Leg Raises: AROM;Strengthening;Both;10 reps General Exercises - Lower Extremity Quad Sets: AROM;10 reps;Both Gluteal Sets: AROM;10 reps Heel Slides: AROM;10 reps Other Exercises Other Exercises: supervision assist for static sitting balance, educated pt on fall precautions and importance of OOB  mobility/sitting upright for improved endurance/strengthening    General Comments General comments (skin integrity, edema, etc.): BP in supine = 90/72, while seated EOB = 80/62      Pertinent Vitals/Pain Pain Assessment: No/denies pain    Home Living                      Prior Function            PT Goals (current goals can now be found in the care plan section) Acute Rehab PT Goals Patient Stated Goal: to get stronger and have a place to go when I leave the hospital Progress towards PT goals: Not progressing toward  goals - comment (limited with hypotension)    Frequency    Min 2X/week      PT Plan Current plan remains appropriate    Co-evaluation     PT goals addressed during session: Mobility/safety with mobility        AM-PAC PT "6 Clicks" Mobility   Outcome Measure  Help needed turning from your back to your side while in a flat bed without using bedrails?: None Help needed moving from lying on your back to sitting on the side of a flat bed without using bedrails?: None Help needed moving to and from a bed to a chair (including a wheelchair)?: A Little Help needed standing up from a chair using your arms (e.g., wheelchair or bedside chair)?: A Little Help needed to walk in hospital room?: A Lot Help needed climbing 3-5 steps with a railing? : A Lot 6 Click Score: 18    End of Session   Activity Tolerance: Treatment limited secondary to medical complications (Comment) (limited by orthostatic hypotension) Patient left: in bed;with call bell/phone within reach;with bed alarm set Nurse Communication: Mobility status PT Visit Diagnosis: Unsteadiness on feet (R26.81);Other abnormalities of gait and mobility (R26.89);Repeated falls (R29.6);Muscle weakness (generalized) (M62.81);History of falling (Z91.81);Difficulty in walking, not elsewhere classified (R26.2);Adult, failure to thrive (R62.7)     Time: 1030-1043 PT Time Calculation (min) (ACUTE ONLY): 13 min  Charges:  $Therapeutic Activity: 8-22 mins                     Julaine Fusi PTA 05/10/20, 11:19 AM

## 2020-05-10 NOTE — Progress Notes (Signed)
Urology Inpatient Progress Note  Subjective:  No acute events overnight Afebrile, VSS Scrotal US shows no worrisome findings.  SP tube is draining well.   Anti-infectives: Anti-infectives (From admission, onward)   Start     Dose/Rate Route Frequency Ordered Stop   05/11/20 1000  levofloxacin (LEVAQUIN) tablet 750 mg     Discontinue    Note to Pharmacy: Penile shaft abscess, urethrocutaneous fistula ?   750 mg Oral Every other day 05/09/20 0837     05/05/20 1500  levofloxacin (LEVAQUIN) tablet 500 mg  Status:  Discontinued       Note to Pharmacy: Penile shaft abscess, urethrocutaneous fistula ?   500 mg Oral Every other day 05/05/20 1441 05/09/20 0837   05/02/20 1900  cefTRIAXone (ROCEPHIN) 1 g in sodium chloride 0.9 % 100 mL IVPB        1 g 200 mL/hr over 30 Minutes Intravenous  Once 05/02/20 1855 05/02/20 2059      Current Facility-Administered Medications  Medication Dose Route Frequency Provider Last Rate Last Admin   acetaminophen (TYLENOL) tablet 650 mg  650 mg Oral Q6H PRN Barb Merino, MD       Or   acetaminophen (TYLENOL) suppository 650 mg  650 mg Rectal Q6H PRN Barb Merino, MD       albuterol (PROVENTIL) (2.5 MG/3ML) 0.083% nebulizer solution 2.5 mg  2.5 mg Nebulization Q2H PRN Barb Merino, MD       aspirin EC tablet 81 mg  81 mg Oral Daily Barb Merino, MD   81 mg at 05/09/20 0831   benztropine (COGENTIN) tablet 0.5 mg  0.5 mg Oral BID Barb Merino, MD   0.5 mg at 05/09/20 2214   Chlorhexidine Gluconate Cloth 2 % PADS 6 each  6 each Topical Daily Barb Merino, MD   6 each at 05/09/20 1059   colesevelam [facility]) tablet 1,875 mg  1,875 mg Oral BID WC Barb Merino, MD   1,875 mg at 05/09/20 1800   docusate sodium (COLACE) capsule 100 mg  100 mg Oral BID PRN Barb Merino, MD       DULoxetine (CYMBALTA) DR capsule 30 mg  30 mg Oral Daily Barb Merino, MD   30 mg at 05/09/20 1200   feeding supplement (GLUCERNA SHAKE) (GLUCERNA SHAKE) liquid 237  mL  237 mL Oral TID BM Fritzi Mandes, MD       folic acid (FOLVITE) tablet 1 mg  1 mg Oral Daily Barb Merino, MD   1 mg at 05/09/20 0831   haloperidol (HALDOL) tablet 0.5 mg  0.5 mg Oral BID Barb Merino, MD   0.5 mg at 05/09/20 2215   heparin injection 5,000 Units  5,000 Units Subcutaneous Q8H Barb Merino, MD   5,000 Units at 05/10/20 0529   insulin aspart (novoLOG) injection 0-5 Units  0-5 Units Subcutaneous QHS Barb Merino, MD   4 Units at 05/02/20 2336   insulin aspart (novoLOG) injection 0-9 Units  0-9 Units Subcutaneous TID WC Barb Merino, MD   1 Units at 05/09/20 1205   insulin glargine (LANTUS) injection 8 Units  8 Units Subcutaneous Daily Barb Merino, MD   8 Units at 05/09/20 0858   [START ON 05/11/2020] levofloxacin (LEVAQUIN) tablet 750 mg  750 mg Oral Ronn Melena, RPH       multivitamin with minerals tablet 1 tablet  1 tablet Oral Daily Barb Merino, MD   1 tablet at 05/09/20 1209   ondansetron (ZOFRAN) tablet 4 mg  4 mg  Oral Q6H PRN Ralene Muskrat B, MD   4 mg at 05/08/20 2012   Or   ondansetron (ZOFRAN) injection 4 mg  4 mg Intravenous Q6H PRN Ralene Muskrat B, MD   4 mg at 05/04/20 1004   protein supplement (ENSURE MAX) liquid  11 oz Oral BID Barb Merino, MD   11 oz at 05/08/20 2305   sodium bicarbonate tablet 1,300 mg  1,300 mg Oral BID Barb Merino, MD   1,300 mg at 05/09/20 2215   thiamine tablet 100 mg  100 mg Oral Daily Barb Merino, MD   100 mg at 05/09/20 0831   traZODone (DESYREL) tablet 25 mg  25 mg Oral QHS PRN Barb Merino, MD       Objective: Vital signs in last 24 hours: Temp:  [97.7 F (36.5 C)-97.9 F (36.6 C)] 97.7 F (36.5 C) (07/31 0803) Pulse Rate:  [70-80] 70 (07/31 0804) Resp:  [15-18] 15 (07/31 0803) BP: (91-124)/(64-90) 91/64 (07/31 0804) SpO2:  [100 %] 100 % (07/31 0803) Weight:  [50.3 kg] 50.3 kg (07/31 0500)  Intake/Output from previous day: 07/30 0701 - 07/31 0700 In: 1200 [P.O.:1200] Out:  500 [Urine:450; Drains:50] Intake/Output this shift: No intake/output data recorded.  Physical Exam Vitals and nursing note reviewed.  Constitutional:      General: He is not in acute distress.    Appearance: He is not ill-appearing, toxic-appearing or diaphoretic.  Genitourinary:    Comments: The packing is in good position and while there is some purulent drainage it doesn't appear consistent with urine and there is no residual erythema or fluctuance.   Skin:    General: Skin is warm and dry.  Neurological:     Mental Status: He is alert.    Lab Results:  No results for input(s): WBC, HGB, HCT, PLT in the last 72 hours. BMET No results for input(s): NA, K, CL, CO2, GLUCOSE, BUN, CREATININE, CALCIUM in the last 72 hours. PT/INR No results for input(s): LABPROT, INR in the last 72 hours. Assessment & Plan: 44 year old comorbid male with likely neurogenic bladder secondary to poorly managed diabetes admitted with a ventral penile abscess without signs of Fournier's gangrene, now s/p SP tube placement.  He is doing well with no erythema or fluctuance around the wound.  The drainage with compression at the base of the penis is thick and not consistent with urine.     Scrotal US showed no worrisome findings.   Recommendations:  -Continue antibiotics -Continue daily dressing changes and light packing with 1/4 inch iodoform gauze  [name], MD 05/10/2020 Patient ID: Gregory Moody, male   DOB: 1976-08-16, 44 y.o.   MRN: [id number]

## 2020-05-10 NOTE — Progress Notes (Signed)
[place] [facility] [place] NAME: Gregory Moody    MR#:  [id number]  DATE OF BIRTH:  February 06, 1976  SUBJECTIVE:   Unable to provide self-care. Status post suprapubic catheter placement. Denies any complaints other than weakness and dizziness when he tries to walk.  Feels much better today. Eating well Does not like to participate with PT much--remains inactive although he tells me he got out to chair 2 times yday REVIEW OF SYSTEMS:   Review of Systems  Constitutional: Negative for chills, fever and weight loss.  HENT: Negative for ear discharge, ear pain and nosebleeds.   Eyes: Negative for blurred vision, pain and discharge.  Respiratory: Negative for sputum production, shortness of breath, wheezing and stridor.   Cardiovascular: Negative for chest pain, palpitations, orthopnea and PND.  Gastrointestinal: Negative for abdominal pain, diarrhea, nausea and vomiting.  Genitourinary: Negative for frequency and urgency.  Musculoskeletal: Negative for back pain and joint pain.  Neurological: Negative for sensory change, speech change, focal weakness and weakness.  Psychiatric/Behavioral: Negative for depression and hallucinations. The patient is not nervous/anxious.    Tolerating Diet:yes Tolerating PT: rec rehab  DRUG ALLERGIES:  No Known Allergies  VITALS:  Blood pressure (!) 91/64, pulse 70, temperature 97.7 F (36.5 C), temperature source Oral, resp. rate 15, height 5\' 8"  (1.727 m), weight 50.3 kg, SpO2 100 %.  PHYSICAL EXAMINATION:   Physical Exam  GENERAL:  44 y.o.-year-old patient lying in the bed with no acute distress. Thin cachectic malnourished EYES: Pupils equal, round, reactive to light and accommodation. No scleral icterus.   HEENT: Head atraumatic, normocephalic. Oropharynx and nasopharynx clear.  NECK:  Supple, no jugular venous distention. No thyroid enlargement, no tenderness.  LUNGS: Normal breath sounds bilaterally, no  wheezing, rales, rhonchi. No use of accessory muscles of respiration.  CARDIOVASCULAR: S1, S2 normal. No murmurs, rubs, or gallops.  ABDOMEN: Soft, nontender, nondistended. Bowel sounds present. No organomegaly or mass.  EXTREMITIES: No cyanosis, clubbing or edema b/l.    NEUROLOGIC: Cranial nerves II through XII are intact. No focal Motor or sensory deficits b/l. Generalized ability PSYCHIATRIC:  patient is alert and oriented x 3.  SKIN: patient has bruises skin tears from previous injuries at home Pressure Injury 12/28/19 Elbow Left Stage 2 -  Partial thickness loss of dermis presenting as a shallow open injury with a red, pink wound bed without slough. (Active)  12/28/19 1422  Location: Elbow  Location Orientation: Left  Staging: Stage 2 -  Partial thickness loss of dermis presenting as a shallow open injury with a red, pink wound bed without slough.  Wound Description (Comments):   Present on Admission: Yes (Present on transfer to 1A)     Pressure Injury 12/28/19 Elbow Right Stage 3 -  Full thickness tissue loss. Subcutaneous fat may be visible but bone, tendon or muscle are NOT exposed. (Active)  12/28/19 1422  Location: Elbow  Location Orientation: Right  Staging: Stage 3 -  Full thickness tissue loss. Subcutaneous fat may be visible but bone, tendon or muscle are NOT exposed.  Wound Description (Comments):   Present on Admission: Yes (Present on transfer to 1A)   LABORATORY PANEL:  CBC Recent Labs  Lab 05/07/20 0519  WBC 8.1  HGB 7.9*  HCT 24.4*  PLT 462*    Chemistries  Recent Labs  Lab 05/05/20 0453 05/06/20 0516 05/07/20 0519  NA 133*   < > 135  K 3.8   < > 4.4  CL 108   < > 110  CO2 20*   < > 16*  GLUCOSE 92   < > 220*  BUN 39*   < > 34*  CREATININE 2.70*   < > 2.48*  CALCIUM 7.2*   < > 7.7*  MG 1.6*  --   --    < > = values in this interval not displayed.   Cardiac Enzymes No results for input(s): TROPONINI in the last 168 hours. RADIOLOGY:  US  SCROTUM W/DOPPLER  Result Date: 05/09/2020 CLINICAL DATA:  Penile abscess.  Assess for scrotal extension. EXAM: SCROTAL ULTRASOUND DOPPLER ULTRASOUND OF THE TESTICLES TECHNIQUE: Complete ultrasound examination of the testicles, epididymis, and other scrotal structures was performed. Color and spectral Doppler ultrasound were also utilized to evaluate blood flow to the testicles. COMPARISON:  None. FINDINGS: Right testicle Measurements: 3.0 x 1.8 x 1.9 cm. Prominent rete testis, a normal variant. No hyperemia or focal lesion. Left testicle Measurements: 3.0 x 1.8 x 2.0 cm. Prominent rete testis, a normal variant. No hyperemia or focal lesion. Right epididymis: 0.6 x 1.1 x 1.3 cm. 4 mm epididymal cyst. No gross hyperemia. Left epididymis:  0.8 x 0.9 x 1.5 cm.  No hyperemia. Hydrocele:  None visualized. Varicocele:  None visualized. Pulsed Doppler interrogation of both testes demonstrates normal low resistance arterial and venous waveforms bilaterally. Echogenic focus within the soft tissues at the base of the penis consistent with air from known penile abscess. IMPRESSION: No sign of inflammatory disease or abscess affecting the testicles or epididymi. No hydrocele or intra scrotal abscess. Electronically Signed   By: Nelson Chimes M.D.   On: 05/09/2020 11:41   ASSESSMENT AND PLAN:  Gregory Santiago-Gonzalezis a 44 y.o.malewith PMH significant for diabetes mellitus, gastroparesis, CKD stage 4, chronic anemia, tuberculosis, COVID-19 infection July 2020, chronic diarrhea, urinary retention, unintentional weight loss, severe protein calorie malnutrition and recurrent admissions for uncontrolled diabetes as well as a previous admission to behavioral health unit due to poor self-care. Patient presented to the ED on 7/23  for evaluation of weakness and dizziness and inability to tolerate p.o and reports being unable to care for himself. He lives with his mother, denies any active alcohol or drug use.   Urinary  retention, penile shaft abscess--s/p packing of abscess  No history of traumatic instrumentation. -Patient had a Foley catheter placed approximately a month ago -Urinary retention likely secondary to diabetic neuropathy, immobility. -Physical exam reveals abscess on the penile shaft that is draining, sent for cultures.   -On Levaquin. -7/27>>Seen by urology Dr brandon--now s/p suprapubic catheter with interventional radiology to bypass his urethra and to let penile abscess heal -7/29>> daily packing of the penile shaft by Urology--per them this patient cannot do it  Generalized weakness, failure to thrive, Severe protein calorie malnutrition -Multifactorial: Poor oral intake, gastroparesis, uncontrolled diabetes, chronic diarrhea, medication nonadherence, poor self-care -albumin level low at 2.8. -Education officer, museum, diabetes coordinator and dietitian involved. -PT/OT consult placed.  May have difficulty placement--due to being undocumented and no payor source, and needs daily penile dressing change with packing  Insulin-dependent type II diabetes mellitus, Uncontrolled hyperglycemia -A1c 8.3 on 7/23. -Only on sliding scale insulin at home.  -on  Lantus 8 units daily and also continue sliding scale insulin.  Orthostatic hypotension appears due to diabetic autonomic neuropathy- BP going up-- hold midodrine  hyponatremia -Sodium level was low at 127 at presentation.   -Probably hypovolemic hyponatremia also with low solute intake and partly secondary to elevated blood  glucose. -na 135  Chronic anemia, anemia of chronic disease. -Hemoglobin low at 8.6 at presentation.  Baseline with 9-10. -No active bleeding but hemoglobin down to 7.2 today. -Continue to monitor.  Transfuse if less than 7.  Chronic kidney disease stage IV with DM-2 uncontrolled -Patient's creatinine appears to range between 3.35 and 5 -Presented with creatinine of 3.51--2.48 -With IV hydration, creatinine  improving--d/c iVF -Continue sodium bicarb 1300 mg twice daily.  Dyslipidemia -Home meds include Aspirin 81 mg daily, WelChol 1875 mg twice daily.   -Lipid panel 7/24 with HDL low at 20, LDL at 76.  Depression with Previous history of suicidal ideation -Denies suicidal ideation at this time.   -Home meds include Cogentin 0.5 mg twice daily, Cymbalta 30 mg daily, Haldol 0.5 mg daily.  Chronic diarrhea -Continue Imodium 2 mg twice daily as needed.  Gastroparesis due to DM-2 -Improving with Reglan.  Continue Protonix.  Chronic wounds--POA -Wound care consulted.   Procedures: suprapubic catheter placement Family communication : none Consults : urology CODE STATUS: full DVT Prophylaxis : heparin  Status is: Inpatient  Dispo:  Patient From: Home  Planned Disposition: most likely home. Patient has had challenges with being undocumented, no payer source. Home health has refused to accept him given poor noncompliance and follow-up. Pt has suprapubic catheter and needs daily penile shaft dressing--not sure how he is going to manage it.  Per [facility] HH can only help few times--pt has poor social situation and no one in the family is ready to help him (per pt)--CHALLENGING discharge!    medically stable for discharge: yes  TOTAL TIME TAKING CARE OF THIS PATIENT: *20* minutes.  >50% time spent on counselling and coordination of care  Note: This dictation was prepared with Dragon dictation along with smaller phrase technology. Any transcriptional errors that result from this process are unintentional.  Fritzi Mandes M.D    [facility]   CC: Primary care physician; [facility] IncPatient ID: Gerry Blanchfield, male   DOB: 04/17/1976, 44 y.o.   MRN: [id number]

## 2020-05-10 NOTE — Progress Notes (Addendum)
Occupational Therapy Treatment Patient Details Name: Sam Overbeck MRN: [id number] DOB: 11/22/1975 Today's Date: 05/10/2020    History of present illness Wah Sabic is a 44 y.o. male with medical history significant of diabetes mellitus, GERD, gastroparesis, DKA, anemia, tobacco abuse, esophageal candidiasis, CKD, malnutrition COVID-19 infection 04/23/2019, C. difficile colitis, and multiple admissions to the ED as well as the the behavioral health unit this year who presents for weakness and dizziness.   OT comments  Mr. Santiago-Gonzalez is making good progress towards his functional goals.  After OTR educated pt on OT role and importance of OOB mobility for improved endurance and independence in ADLs, pt was motivated to participate in today's session.  He continues to be limited by orthostatic/low blood pressure.  Pt's BP in supine was 90/72 and 80/60 while seated EOB.  Pt reports feeling slightly dizzy when sitting up, but reports the feeling goes away after a couple minutes.  OTR deferred further transfer practice, but educated pt on various ways to continue working towards functional strengthening and endurance including sitting upright in bed, doing as many tasks on his own as possible, and optimizing nutrition.  Pt verbalized understanding.  Pt will continue to benefit from skilled OT services in acute setting to address endurance, functional strengthening, safety and independence in ADLs.  SNF remains most appropriate discharge recommendation.  Interpreter used throughout session via [place], [place]   Follow Up Recommendations  SNF;Other (comment) (mental health therapy services)    Equipment Recommendations  Other (comment) (defer to post acute setting)    Recommendations for Other Services      Precautions / Restrictions Precautions Precautions: Fall Restrictions Weight Bearing Restrictions: No Other Position/Activity Restrictions: 7/27 placement of SP  cath       Mobility Bed Mobility Overal bed mobility: Modified Independent Bed Mobility: Supine to Sit;Sit to Supine     Supine to sit: Modified independent (Device/Increase time) Sit to supine: Modified independent (Device/Increase time)   General bed mobility comments: increased time and effort for bed mobility; able to perform without physical assist.  Transfers Overall transfer level: Needs assistance               General transfer comment: Not assessed 2/2 orthostatic vitals    Balance Overall balance assessment: Needs assistance Sitting-balance support: No upper extremity supported;Feet supported Sitting balance-Leahy Scale: Good                                     ADL either performed or assessed with clinical judgement   ADL Overall ADL's : Needs assistance/impaired                                       General ADL Comments: Pt generally requires setup assist for seated UB ADLs including feeding, grooming, upper body bathing and dressing.  Unable to assess functional transfers/ambulation 2/2 pt's low blood pressure.     Vision Patient Visual Report: No change from baseline     Perception     Praxis      Cognition Arousal/Alertness: Awake/alert Behavior During Therapy: WFL for tasks assessed/performed Overall Cognitive Status: Within Functional Limits for tasks assessed  General Comments: Pt is motivated to improve his strengthening, endurance, and functional ambulation.        Exercises Other Exercises Other Exercises: supervision assist for static sitting balance, educated pt on fall precautions and importance of OOB mobility/sitting upright for improved endurance/strengthening   Shoulder Instructions       General Comments BP in supine = 90/72, while seated EOB = 80/62    Pertinent Vitals/ Pain       Pain Assessment: No/denies pain  Home Living                                           Prior Functioning/Environment              Frequency  Min 1X/week        Progress Toward Goals  OT Goals(current goals can now be found in the care plan section)  Progress towards OT goals: Progressing toward goals  Acute Rehab OT Goals Patient Stated Goal: to get stronger and have a place to go when I leave the hospital OT Goal Formulation: With patient Time For Goal Achievement: 05/17/20 Potential to Achieve Goals: Good  Plan Discharge plan remains appropriate;Frequency remains appropriate    Co-evaluation                 AM-PAC OT "6 Clicks" Daily Activity     Outcome Measure   Help from another person eating meals?: None Help from another person taking care of personal grooming?: A Little Help from another person toileting, which includes using toliet, bedpan, or urinal?: A Lot Help from another person bathing (including washing, rinsing, drying)?: A Little Help from another person to put on and taking off regular upper body clothing?: A Little Help from another person to put on and taking off regular lower body clothing?: A Little 6 Click Score: 18    End of Session    OT Visit Diagnosis: Unsteadiness on feet (R26.81);History of falling (Z91.81)   Activity Tolerance Patient tolerated treatment well   Patient Left in bed;with call bell/phone within reach;with bed alarm set   Nurse Communication          Time: [phone] OT Time Calculation (min): 37 min  Charges: OT General Charges $OT Visit: 1 Visit OT Treatments $Therapeutic Activity: 23-37 mins  Myrtie Hawk Ellie Bryand, OTR/L 05/10/20, 10:40 AM

## 2020-05-10 NOTE — Plan of Care (Signed)
  Problem: Education: Goal: Knowledge of General Education information will improve Description Including pain rating scale, medication(s)/side effects and non-pharmacologic comfort measures Outcome: Progressing   

## 2020-05-11 LAB — GLUCOSE, CAPILLARY
Glucose-Capillary: 161 mg/dL — ABNORMAL HIGH (ref 70–99)
Glucose-Capillary: 117 mg/dL — ABNORMAL HIGH (ref 70–99)
Glucose-Capillary: 130 mg/dL — ABNORMAL HIGH (ref 70–99)

## 2020-05-11 MED ORDER — MIDODRINE HCL 5 MG PO TABS
5.0000 mg | ORAL_TABLET | Freq: Two times a day (BID) | ORAL | Status: DC
  Administered 2020-05-11 – 2020-05-13 (×5): 5 mg via ORAL
  Filled 2020-05-11 (×5): qty 1

## 2020-05-11 NOTE — Progress Notes (Signed)
[facility] [place] NAME: Gregory Moody    MR#:  [id number]  DATE OF BIRTH:  01/20/1976  SUBJECTIVE:   Feels much better today. Eating well  Pt got up on his own today and walked to the chair w/o any issues REVIEW OF SYSTEMS:   Review of Systems  Constitutional: Negative for chills, fever and weight loss.  HENT: Negative for ear discharge, ear pain and nosebleeds.   Eyes: Negative for blurred vision, pain and discharge.  Respiratory: Negative for sputum production, shortness of breath, wheezing and stridor.   Cardiovascular: Negative for chest pain, palpitations, orthopnea and PND.  Gastrointestinal: Negative for abdominal pain, diarrhea, nausea and vomiting.  Genitourinary: Negative for frequency and urgency.  Musculoskeletal: Negative for back pain and joint pain.  Neurological: Negative for sensory change, speech change, focal weakness and weakness.  Psychiatric/Behavioral: Negative for depression and hallucinations. The patient is not nervous/anxious.    Tolerating Diet:yes Tolerating PT: rec rehab  DRUG ALLERGIES:  No Known Allergies  VITALS:  Blood pressure (!) 156/99, pulse 77, temperature (!) 97.5 F (36.4 C), temperature source Oral, resp. rate 18, height 5\' 8"  (1.727 m), weight 50.3 kg, SpO2 100 %.  PHYSICAL EXAMINATION:   Physical Exam  GENERAL:  44 y.o.-year-old patient lying in the bed with no acute distress. Thin cachectic malnourished EYES: Pupils equal, round, reactive to light and accommodation. No scleral icterus.   HEENT: Head atraumatic, normocephalic. Oropharynx and nasopharynx clear.  NECK:  Supple, no jugular venous distention. No thyroid enlargement, no tenderness.  LUNGS: Normal breath sounds bilaterally, no wheezing, rales, rhonchi. No use of accessory muscles of respiration.  CARDIOVASCULAR: S1, S2 normal. No murmurs, rubs, or gallops.  ABDOMEN: Soft, nontender, nondistended. Bowel sounds  present. No organomegaly or mass.  EXTREMITIES: No cyanosis, clubbing or edema b/l.    NEUROLOGIC: Cranial nerves II through XII are intact. No focal Motor or sensory deficits b/l. Generalized ability PSYCHIATRIC:  patient is alert and oriented x 3.  SKIN: patient has bruises skin tears from previous injuries at home Pressure Injury 12/28/19 Elbow Left Stage 2 -  Partial thickness loss of dermis presenting as a shallow open injury with a red, pink wound bed without slough. (Active)  12/28/19 1422  Location: Elbow  Location Orientation: Left  Staging: Stage 2 -  Partial thickness loss of dermis presenting as a shallow open injury with a red, pink wound bed without slough.  Wound Description (Comments):   Present on Admission: Yes (Present on transfer to 1A)     Pressure Injury 12/28/19 Elbow Right Stage 3 -  Full thickness tissue loss. Subcutaneous fat may be visible but bone, tendon or muscle are NOT exposed. (Active)  12/28/19 1422  Location: Elbow  Location Orientation: Right  Staging: Stage 3 -  Full thickness tissue loss. Subcutaneous fat may be visible but bone, tendon or muscle are NOT exposed.  Wound Description (Comments):   Present on Admission: Yes (Present on transfer to 1A)   LABORATORY PANEL:  CBC Recent Labs  Lab 05/07/20 0519  WBC 8.1  HGB 7.9*  HCT 24.4*  PLT 462*    Chemistries  Recent Labs  Lab 05/05/20 0453 05/06/20 0516 05/07/20 0519  NA 133*   < > 135  K 3.8   < > 4.4  CL 108   < > 110  CO2 20*   < > 16*  GLUCOSE 92   < > 220*  BUN 39*   < >  34*  CREATININE 2.70*   < > 2.48*  CALCIUM 7.2*   < > 7.7*  MG 1.6*  --   --    < > = values in this interval not displayed.   Cardiac Enzymes No results for input(s): TROPONINI in the last 168 hours. RADIOLOGY:  No results found. ASSESSMENT AND PLAN:  Gregory Santiago-Gonzalezis a 44 y.o.malewith PMH significant for diabetes mellitus, gastroparesis, CKD stage 4, chronic anemia, tuberculosis, COVID-19  infection July 2020, chronic diarrhea, urinary retention, unintentional weight loss, severe protein calorie malnutrition and recurrent admissions for uncontrolled diabetes as well as a previous admission to behavioral health unit due to poor self-care. Patient presented to the ED on 7/23  for evaluation of weakness and dizziness and inability to tolerate p.o and reports being unable to care for himself. He lives with his mother, denies any active alcohol or drug use.   Urinary retention, penile shaft abscess--s/p packing of abscess  No history of traumatic instrumentation. -Patient had a Foley catheter placed approximately a month ago -Urinary retention likely secondary to diabetic neuropathy, immobility. -Physical exam reveals abscess on the penile shaft that is draining, sent for cultures.   -On Levaquin. -7/27>>Seen by urology Dr brandon--now s/p suprapubic catheter with interventional radiology to bypass his urethra and to let penile abscess heal -7/29>> daily packing of the penile shaft by Urology--per them this patient cannot do it on his own  Generalized weakness, failure to thrive, Severe protein calorie malnutrition -Multifactorial: Poor oral intake, gastroparesis, uncontrolled diabetes, chronic diarrhea, medication nonadherence, poor self-care -albumin level low at 2.8. -Education officer, museum, diabetes coordinator and dietitian involved. -PT/OT consult placed.  Having difficulty placement--due to being undocumented and no payor source, and needs daily penile dressing change with packing  Insulin-dependent type II diabetes mellitus, Uncontrolled hyperglycemia -A1c 8.3 on 7/23. -Only on sliding scale insulin at home.  -on  Lantus 8 units daily and also continue sliding scale insulin.  Orthostatic hypotension appears due to diabetic autonomic neuropathy- BP going up-- hold midodrine  hyponatremia -Sodium level was low at 127 at presentation.   -Probably hypovolemic hyponatremia also  with low solute intake and partly secondary to elevated blood glucose. -na 135  Chronic anemia, anemia of chronic disease. -Hemoglobin low at 8.6 at presentation.  Baseline with 9-10. -No active bleeding but hemoglobin down to 7.2 today. -Continue to monitor.  Transfuse if less than 7.  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  Dyslipidemia -Home meds include Aspirin 81 mg daily, WelChol 1875 mg twice daily.   -Lipid panel 7/24 with HDL low at 20, LDL at 76.  Depression with Previous history of suicidal ideation -Denies suicidal ideation at this time.   -Home meds include Cogentin 0.5 mg twice daily, Cymbalta 30 mg daily, Haldol 0.5 mg daily.  Chronic diarrhea -Continue Imodium 2 mg twice daily as needed.  Gastroparesis due to DM-2 -Improving with Reglan.  Continue Protonix.  Chronic wounds--POA -Wound care consult noted   Procedures: suprapubic catheter placement Family communication : none Consults : urology CODE STATUS: full DVT Prophylaxis : heparin  Status is: Inpatient  Dispo:  Patient From: Home  Planned Disposition: most likely home. Patient has had challenges with being undocumented, no payer source. Home health has refused to accept him given poor noncompliance and follow-up. Pt has suprapubic catheter and needs daily  penile shaft dressing--not sure how he is going to manage it.  Per [facility] charity HH can only help few times--pt has poor social situation and no one in the family is ready to help him (per pt)--CHALLENGING discharge!      medically stable for discharge: yes  TOTAL TIME TAKING CARE OF THIS PATIENT: *20* minutes.  >50% time spent on counselling and coordination of care  Note: This dictation was prepared with Dragon dictation along  with smaller phrase technology. Any transcriptional errors that result from this process are unintentional.  Fritzi Mandes M.D    [facility]   CC: Primary care physician; [facility] IncPatient ID: Gregory Moody, male   DOB: [birth year], 44 y.o.   MRN: [id number]

## 2020-05-11 NOTE — Progress Notes (Signed)
Urology Inpatient Progress Report  Weakness [R53.1] Hypotension, unspecified hypotension type [I95.9] FTT (failure to thrive) in adult [R62.7]        Intv/Subj: No acute events overnight. Patient is without complaint. Nursing change dressing this morning.  Patient is doing well but has a challenging social situation complicating his discharge.  Active Problems:   Malnutrition ([place])   Hyperglycemia   Diabetes mellitus with hyperglycemia (HCC)   Depression   Protein-calorie malnutrition, severe   Weakness   CKD (chronic kidney disease), stage IV (HCC)   FTT (failure to thrive) in adult   Penile abscess   Neurogenic bladder  Current Facility-Administered Medications  Medication Dose Route Frequency Provider Last Rate Last Admin  . acetaminophen (TYLENOL) tablet 650 mg  650 mg Oral Q6H PRN Barb Merino, MD   650 mg at 05/10/20 2142   Or  . acetaminophen (TYLENOL) suppository 650 mg  650 mg Rectal Q6H PRN Barb Merino, MD      . albuterol (PROVENTIL) (2.5 MG/3ML) 0.083% nebulizer solution 2.5 mg  2.5 mg Nebulization Q2H PRN Barb Merino, MD      . aspirin EC tablet 81 mg  81 mg Oral Daily Barb Merino, MD   81 mg at 05/11/20 0904  . benztropine (COGENTIN) tablet 0.5 mg  0.5 mg Oral BID Barb Merino, MD   0.5 mg at 05/11/20 0905  . Chlorhexidine Gluconate Cloth 2 % PADS 6 each  6 each Topical Daily Barb Merino, MD   6 each at 05/11/20 [phone]  . colesevelam [facility]) tablet 1,875 mg  1,875 mg Oral BID WC Barb Merino, MD   1,875 mg at 05/11/20 0814  . docusate sodium (COLACE) capsule 100 mg  100 mg Oral BID PRN Barb Merino, MD      . DULoxetine (CYMBALTA) DR capsule 30 mg  30 mg Oral Daily Barb Merino, MD   30 mg at 05/11/20 0905  . feeding supplement (GLUCERNA SHAKE) (GLUCERNA SHAKE) liquid 237 mL  237 mL Oral TID BM Fritzi Mandes, MD      . folic acid (FOLVITE) tablet 1 mg  1 mg Oral Daily Barb Merino, MD   1 mg at 05/11/20 0904  . haloperidol (HALDOL) tablet  0.5 mg  0.5 mg Oral BID Barb Merino, MD   0.5 mg at 05/11/20 0906  . heparin injection 5,000 Units  5,000 Units Subcutaneous Q8H Barb Merino, MD   5,000 Units at 05/11/20 0549  . insulin aspart (novoLOG) injection 0-5 Units  0-5 Units Subcutaneous QHS Barb Merino, MD   3 Units at 05/10/20 2144  . insulin aspart (novoLOG) injection 0-9 Units  0-9 Units Subcutaneous TID WC Barb Merino, MD   2 Units at 05/11/20 1156  . insulin glargine (LANTUS) injection 8 Units  8 Units Subcutaneous Daily Barb Merino, MD   8 Units at 05/11/20 [phone]  . levofloxacin (LEVAQUIN) tablet 750 mg  750 mg Oral QODAY Rocky Morel, RPH   750 mg at 05/11/20 9417  . midodrine (PROAMATINE) tablet 5 mg  5 mg Oral BID WC Fritzi Mandes, MD   5 mg at 05/11/20 4081  . multivitamin with minerals tablet 1 tablet  1 tablet Oral Daily Barb Merino, MD   1 tablet at 05/11/20 1157  . ondansetron (ZOFRAN) tablet 4 mg  4 mg Oral Q6H PRN Ralene Muskrat B, MD   4 mg at 05/08/20 2012   Or  . ondansetron (ZOFRAN) injection 4 mg  4 mg Intravenous Q6H PRN Sreenath, Sudheer B,  MD   4 mg at 05/04/20 1004  . protein supplement (ENSURE MAX) liquid  11 oz Oral BID Barb Merino, MD   11 oz at 05/10/20 2145  . sodium bicarbonate tablet 1,300 mg  1,300 mg Oral BID Barb Merino, MD   1,300 mg at 05/11/20 0907  . thiamine tablet 100 mg  100 mg Oral Daily Barb Merino, MD   100 mg at 05/11/20 0904  . traZODone (DESYREL) tablet 25 mg  25 mg Oral QHS PRN Barb Merino, MD         Objective: Vital: Vitals:   05/10/20 0804 05/10/20 1459 05/11/20 0038 05/11/20 0730  BP: (!) 91/64 (!) 133/95 (!) 130/91 (!) 156/99  Pulse: 70 81 82 77  Resp:  20 18 18   Temp:  (!) 97.5 F (36.4 C) 97.7 F (36.5 C) (!) 97.5 F (36.4 C)  TempSrc:  Oral Oral Oral  SpO2:  100% 100% 100%  Weight:      Height:       I/Os: I/O last 3 completed shifts: In: 1440 [P.O.:1440] Out: 3000 [Urine:625; Drains:2375]  Physical Exam:  General: Patient is in  no apparent distress Lungs: Normal respiratory effort, chest expands symmetrically. GI: The abdomen is soft and nontender without mass.  Suprapubic catheter in place draining clear yellow urine Penis with dressing in place clean dry and intact Ext: lower extremities symmetric   Lab Results: No results for input(s): WBC, HGB, HCT in the last 72 hours. No results for input(s): NA, K, CL, CO2, GLUCOSE, BUN, CREATININE, CALCIUM in the last 72 hours. No results for input(s): LABPT, INR in the last 72 hours. No results for input(s): LABURIN in the last 72 hours. Results for orders placed or performed during the hospital encounter of 05/02/20  Blood Culture (routine x 2)     Status: None   Collection Time: 05/02/20  3:58 PM   Specimen: BLOOD  Result Value Ref Range Status   Specimen Description BLOOD LAC  Final   Special Requests   Final    BOTTLES DRAWN AEROBIC AND ANAEROBIC Blood Culture adequate volume   Culture   Final    NO GROWTH 5 DAYS Performed at [facility], [address] [place], [place] [address]    Report Status 05/07/2020 FINAL  Final  Blood Culture (routine x 2)     Status: None   Collection Time: 05/02/20  3:59 PM   Specimen: BLOOD  Result Value Ref Range Status   Specimen Description BLOOD RAC  Final   Special Requests   Final    BOTTLES DRAWN AEROBIC AND ANAEROBIC Blood Culture results may not be optimal due to an excessive volume of blood received in culture bottles   Culture   Final    NO GROWTH 5 DAYS Performed at [facility], [address] [place], [place] [address]    Report Status 05/07/2020 FINAL  Final  SARS Coronavirus 2 by RT PCR (hospital order, performed in [facility] hospital lab) Nasopharyngeal Nasopharyngeal Swab     Status: None   Collection Time: 05/02/20  6:00 PM   Specimen: Nasopharyngeal Swab  Result Value Ref Range Status   SARS Coronavirus 2 NEGATIVE NEGATIVE Final    Comment: (NOTE) SARS-CoV-2 target nucleic acids  are NOT DETECTED.  The SARS-CoV-2 RNA is generally detectable in upper and lower respiratory specimens during the acute phase of infection. The lowest concentration of SARS-CoV-2 viral copies this assay can detect is 250 copies / mL. A  negative result does not preclude SARS-CoV-2 infection and should not be used as the sole basis for treatment or other patient management decisions.  A negative result may occur with improper specimen collection / handling, submission of specimen other than nasopharyngeal swab, presence of viral mutation(s) within the areas targeted by this assay, and inadequate number of viral copies (<250 copies / mL). A negative result must be combined with clinical observations, patient history, and epidemiological information.  Fact Sheet for Patients:   StrictlyIdeas.no  Fact Sheet for Healthcare Providers: BankingDealers.co.za  This test is not yet approved or  cleared by the [place] FDA and has been authorized for detection and/or diagnosis of SARS-CoV-2 by FDA under an Emergency Use Authorization (EUA).  This EUA will remain in effect (meaning this test can be used) for the duration of the COVID-19 declaration under Section 564(b)(1) of the Act, 21 U.S.C. section 360bbb-3(b)(1), unless the authorization is terminated or revoked sooner.  Performed at [facility], [address] [place], [place] [address]   Aerobic/Anaerobic Culture (surgical/deep wound)     Status: Abnormal   Collection Time: 05/05/20 11:12 AM   Specimen: Penis; Abscess  Result Value Ref Range Status   Specimen Description   Final    PENIS Performed at [facility], [address] [place], [place] [address]    Special Requests   Final    NONE Performed at [facility], [place], [place] [address]    Gram Stain   Final    Emmit [place] POSITIVE COCCI ABUNDANT GRAM NEGATIVE  RODS MODERATE GRAM POSITIVE RODS    Culture (A)  Final    MULTIPLE ORGANISMS PRESENT, NONE PREDOMINANT NO ANAEROBES ISOLATED Performed at [facility], [place] [address] [place],  [address]    Report Status 05/10/2020 FINAL  Final    Studies/Results: No results found.  Assessment: 44 year old comorbid male with likely neurogenic bladder secondary to poorly managed diabetes admitted with a ventral penile abscess without signs of Fournier's gangrene, now s/p SP tube placement.  He is doing well with no erythema or fluctuance around the wound.     Plan:  -Continue antibiotics -Continue daily dressing changes and light packing with 1/4 inch iodoform gauze   Link Snuffer, MD Urology 05/11/2020, 1:32 PM

## 2020-05-12 LAB — GLUCOSE, CAPILLARY
Glucose-Capillary: 128 mg/dL — ABNORMAL HIGH (ref 70–99)
Glucose-Capillary: 120 mg/dL — ABNORMAL HIGH (ref 70–99)
Glucose-Capillary: 176 mg/dL — ABNORMAL HIGH (ref 70–99)
Glucose-Capillary: 200 mg/dL — ABNORMAL HIGH (ref 70–99)

## 2020-05-12 NOTE — Progress Notes (Signed)
Urology Inpatient Progress Note  Subjective: No acute events overnight Afebrile, VSS On antibiotics as below  Anti-infectives: Anti-infectives (From admission, onward)   Start     Dose/Rate Route Frequency Ordered Stop   05/11/20 1000  levofloxacin (LEVAQUIN) tablet 750 mg     Discontinue    Note to Pharmacy: Penile shaft abscess, urethrocutaneous fistula ?   750 mg Oral Every other day 05/09/20 0837     05/05/20 1500  levofloxacin (LEVAQUIN) tablet 500 mg  Status:  Discontinued       Note to Pharmacy: Penile shaft abscess, urethrocutaneous fistula ?   500 mg Oral Every other day 05/05/20 1441 05/09/20 0837   05/02/20 1900  cefTRIAXone (ROCEPHIN) 1 g in sodium chloride 0.9 % 100 mL IVPB        1 g 200 mL/hr over 30 Minutes Intravenous  Once 05/02/20 1855 05/02/20 2059      Current Facility-Administered Medications  Medication Dose Route Frequency Provider Last Rate Last Admin  . acetaminophen (TYLENOL) tablet 650 mg  650 mg Oral Q6H PRN Barb Merino, MD   650 mg at 05/10/20 2142   Or  . acetaminophen (TYLENOL) suppository 650 mg  650 mg Rectal Q6H PRN Barb Merino, MD      . albuterol (PROVENTIL) (2.5 MG/3ML) 0.083% nebulizer solution 2.5 mg  2.5 mg Nebulization Q2H PRN Barb Merino, MD      . aspirin EC tablet 81 mg  81 mg Oral Daily Barb Merino, MD   81 mg at 05/12/20 0801  . benztropine (COGENTIN) tablet 0.5 mg  0.5 mg Oral BID Barb Merino, MD   0.5 mg at 05/12/20 0807  . Chlorhexidine Gluconate Cloth 2 % PADS 6 each  6 each Topical Daily Barb Merino, MD   6 each at 05/12/20 [phone]  . colesevelam [facility]) tablet 1,875 mg  1,875 mg Oral BID WC Barb Merino, MD   1,875 mg at 05/12/20 0803  . docusate sodium (COLACE) capsule 100 mg  100 mg Oral BID PRN Barb Merino, MD      . DULoxetine (CYMBALTA) DR capsule 30 mg  30 mg Oral Daily Barb Merino, MD   30 mg at 05/11/20 0905  . feeding supplement (GLUCERNA SHAKE) (GLUCERNA SHAKE) liquid 237 mL  237 mL Oral TID BM  Fritzi Mandes, MD   237 mL at 05/11/20 2000  . folic acid (FOLVITE) tablet 1 mg  1 mg Oral Daily Barb Merino, MD   1 mg at 05/12/20 3790  . haloperidol (HALDOL) tablet 0.5 mg  0.5 mg Oral BID Barb Merino, MD   0.5 mg at 05/12/20 0804  . heparin injection 5,000 Units  5,000 Units Subcutaneous Q8H Barb Merino, MD   5,000 Units at 05/12/20 0553  . insulin aspart (novoLOG) injection 0-5 Units  0-5 Units Subcutaneous QHS Barb Merino, MD   3 Units at 05/10/20 2144  . insulin aspart (novoLOG) injection 0-9 Units  0-9 Units Subcutaneous TID WC Barb Merino, MD   1 Units at 05/12/20 0806  . insulin glargine (LANTUS) injection 8 Units  8 Units Subcutaneous Daily Barb Merino, MD   8 Units at 05/12/20 0805  . levofloxacin (LEVAQUIN) tablet 750 mg  750 mg Oral QODAY Rocky Morel, RPH   750 mg at 05/11/20 2409  . midodrine (PROAMATINE) tablet 5 mg  5 mg Oral BID WC Fritzi Mandes, MD   5 mg at 05/12/20 7353  . multivitamin with minerals tablet 1 tablet  1 tablet Oral Daily Ghimire,  Dante Gang, MD   1 tablet at 05/11/20 1157  . ondansetron (ZOFRAN) tablet 4 mg  4 mg Oral Q6H PRN Ralene Muskrat B, MD   4 mg at 05/08/20 2012   Or  . ondansetron (ZOFRAN) injection 4 mg  4 mg Intravenous Q6H PRN Ralene Muskrat B, MD   4 mg at 05/04/20 1004  . protein supplement (ENSURE MAX) liquid  11 oz Oral BID Barb Merino, MD   11 oz at 05/11/20 2143  . sodium bicarbonate tablet 1,300 mg  1,300 mg Oral BID Barb Merino, MD   1,300 mg at 05/12/20 0803  . thiamine tablet 100 mg  100 mg Oral Daily Barb Merino, MD   100 mg at 05/12/20 0801  . traZODone (DESYREL) tablet 25 mg  25 mg Oral QHS PRN Barb Merino, MD       Objective: Vital signs in last 24 hours: Temp:  [97.7 F (36.5 C)] 97.7 F (36.5 C) (08/01 1649) Pulse Rate:  [80-82] 80 (08/02 0802) Resp:  [16-18] 16 (08/02 0802) BP: (115)/(83) 115/83 (08/02 0802) SpO2:  [100 %] 100 % (08/02 0802) Weight:  [50.1 kg] 50.1 kg (08/02  0500)  Intake/Output from previous day: 08/01 0701 - 08/02 0700 In: 720 [P.O.:720] Out: 900 [Drains:900] Intake/Output this shift: Total I/O In: -  Out: 350 [Drains:350]  Physical Exam Vitals and nursing note reviewed.  Constitutional:      General: He is not in acute distress.    Appearance: He is not ill-appearing, toxic-appearing or diaphoretic.  HENT:     Head: Normocephalic and atraumatic.  Pulmonary:     Effort: Pulmonary effort is normal. No respiratory distress.  Genitourinary:    Comments: Penile abscess visualized with packing in place at the proximal sinus.  Distal sinus appears with healthy granulation tissue at the base.  No purulent drainage noted today.  No fluctuance or crepitus, decreased scrotal tenderness. Skin:    General: Skin is warm and dry.  Neurological:     Mental Status: He is alert and oriented to person, place, and time.  Psychiatric:        Mood and Affect: Mood normal.        Behavior: Behavior normal.    Assessment & Plan: 44 year old comorbid male with likely neurogenic bladder secondary to poorly managed diabetes admitted with a ventral penile abscess without signs of Fournier's gangrene, now s/p SP tube placement.  Discharge planning has been complicated by poor social support, limited available resources, and poor compliance with self care.  Daily dressing change performed by nursing this morning.  Wound appears improved today without purulent drainage.  Please continue daily dressing changes at the bedside, using light iodoform packing to the base of the abscess.  I had a lengthy conversation with the patient at the bedside today.  I attempted to instruct him in wound care with packing replacement, however patient refused and stated he was unable to do so.  I explained that he will require continued abscess packing on a daily basis to ensure that the abscess closes via secondary intention to reduce the risk of abscess recurrence.  Patient  inquired as to how long it will take his abscess to heal, and I explained that this is unpredictable and likely prolonged by his diabetes.  Additionally, I explained that management options for his urinary retention at this point include keeping his SP tube long-term or ultimately converting back to a urethral Foley once his penile abscess resolves.  He stated he would  prefer to keep his SP tube in place.  I explained that this would require follow-up with interventional radiology for catheter exchange/upsizing until he is able to, and have routine changes in our clinic.  He expressed understanding.  Recommendations: -Continue empiric antibiotics for total of 14 days of therapy -Nursing to continue daily dressing changes with light iodoform gauze packing to the base of the abscess (order updated today to reflect this) -Please involve urology in discharge planning so that we may arrange outpatient follow-up with interventional radiology for SP tube upsizing/exchange  Debroah Loop, PA-C 05/12/2020

## 2020-05-12 NOTE — TOC Progression Note (Signed)
Transition of Care [facility]) - Progression Note    Patient Details  Name: Gregory Moody MRN: [id number] Date of Birth: 05-05-1976  Transition of Care [facility]) CM/SW [place], RN Phone Number: 05/12/2020, 2:45 PM  Clinical Narrative:   I called Zack with [employer] I asked for a 3 in 1 and RW thru charity         Expected Discharge Plan and Services                                                 Social Determinants of Health (SDOH) Interventions    Readmission Risk Interventions Readmission Risk Prevention Plan 05/09/2020 04/09/2020 03/31/2020  Transportation Screening Complete Complete Complete  PCP or Specialist Appt within 3-5 Days - - -  Social Work Consult for [place] - - -  Medication Review Press photographer) Complete Complete Complete  PCP or Specialist appointment within 3-5 days of discharge - Patient refused -  [place] or [place] - Not Complete Complete  SW Recovery Care/Counseling Consult - - Complete  Palliative Care Screening - Complete Not [place] Not Applicable Not Applicable Not Applicable  Some recent data might be hidden

## 2020-05-12 NOTE — Progress Notes (Signed)
[place] [facility] [place] NAME: Jermel Artley    MR#:  [id number]  DATE OF BIRTH:  1976/04/05  SUBJECTIVE:   Pt got up on his own today and walked to the chair w/o any issues sat for about 30 mins REVIEW OF SYSTEMS:   Review of Systems  Constitutional: Negative for chills, fever and weight loss.  HENT: Negative for ear discharge, ear pain and nosebleeds.   Eyes: Negative for blurred vision, pain and discharge.  Respiratory: Negative for sputum production, shortness of breath, wheezing and stridor.   Cardiovascular: Negative for chest pain, palpitations, orthopnea and PND.  Gastrointestinal: Negative for abdominal pain, diarrhea, nausea and vomiting.  Genitourinary: Negative for frequency and urgency.  Musculoskeletal: Negative for back pain and joint pain.  Neurological: Negative for sensory change, speech change, focal weakness and weakness.  Psychiatric/Behavioral: Negative for depression and hallucinations. The patient is not nervous/anxious.    Tolerating Diet:yes Tolerating PT: rec rehab  DRUG ALLERGIES:  No Known Allergies  VITALS:  Blood pressure 115/83, pulse 80, temperature 97.7 F (36.5 C), temperature source Oral, resp. rate 16, height 5\' 8"  (1.727 m), weight 50.1 kg, SpO2 100 %.  PHYSICAL EXAMINATION:   Physical Exam  GENERAL:  44 y.o.-year-old patient lying in the bed with no acute distress. Thin cachectic malnourished EYES: Pupils equal, round, reactive to light and accommodation. No scleral icterus.   HEENT: Head atraumatic, normocephalic. Oropharynx and nasopharynx clear.  NECK:  Supple, no jugular venous distention. No thyroid enlargement, no tenderness.  LUNGS: Normal breath sounds bilaterally, no wheezing, rales, rhonchi. No use of accessory muscles of respiration.  CARDIOVASCULAR: S1, S2 normal. No murmurs, rubs, or gallops.  ABDOMEN: Soft, nontender, nondistended. Bowel sounds present. No organomegaly or  mass.  EXTREMITIES: No cyanosis, clubbing or edema b/l.    NEUROLOGIC: Cranial nerves II through XII are intact. No focal Motor or sensory deficits b/l. Generalized ability PSYCHIATRIC:  patient is alert and oriented x 3.  SKIN: patient has bruises skin tears from previous injuries at home Pressure Injury 12/28/19 Elbow Left Stage 2 -  Partial thickness loss of dermis presenting as a shallow open injury with a red, pink wound bed without slough. (Active)  12/28/19 1422  Location: Elbow  Location Orientation: Left  Staging: Stage 2 -  Partial thickness loss of dermis presenting as a shallow open injury with a red, pink wound bed without slough.  Wound Description (Comments):   Present on Admission: Yes (Present on transfer to 1A)     Pressure Injury 12/28/19 Elbow Right Stage 3 -  Full thickness tissue loss. Subcutaneous fat may be visible but bone, tendon or muscle are NOT exposed. (Active)  12/28/19 1422  Location: Elbow  Location Orientation: Right  Staging: Stage 3 -  Full thickness tissue loss. Subcutaneous fat may be visible but bone, tendon or muscle are NOT exposed.  Wound Description (Comments):   Present on Admission: Yes (Present on transfer to 1A)   LABORATORY PANEL:  CBC Recent Labs  Lab 05/07/20 0519  WBC 8.1  HGB 7.9*  HCT 24.4*  PLT 462*    Chemistries  Recent Labs  Lab 05/07/20 0519  NA 135  K 4.4  CL 110  CO2 16*  GLUCOSE 220*  BUN 34*  CREATININE 2.48*  CALCIUM 7.7*   Cardiac Enzymes No results for input(s): TROPONINI in the last 168 hours. RADIOLOGY:  No results found. ASSESSMENT AND PLAN:  Bladimir Auman a 44 y.o.malewith  PMH significant for diabetes mellitus, gastroparesis, CKD stage 4, chronic anemia, tuberculosis, COVID-19 infection July 2020, chronic diarrhea, urinary retention, unintentional weight loss, severe protein calorie malnutrition and recurrent admissions for uncontrolled diabetes as well as a previous admission to  behavioral health unit due to poor self-care. Patient presented to the ED on 7/23  for evaluation of weakness and dizziness and inability to tolerate p.o and reports being unable to care for himself. He lives with his mother, denies any active alcohol or drug use.   Urinary retention, penile shaft abscess--s/p packing of abscess  No history of traumatic instrumentation. -Patient had a Foley catheter placed approximately a month ago -Urinary retention likely secondary to diabetic neuropathy, immobility. -Physical exam reveals abscess on the penile shaft that is draining, sent for cultures.   -On Levaquin. -7/27>>Seen by urology Dr brandon--now s/p suprapubic catheter with interventional radiology to bypass his urethra and to let penile abscess heal -7/29>> daily packing of the penile shaft by Urology--per them this patient cannot do it on his own  Generalized weakness, failure to thrive, Severe protein calorie malnutrition -Multifactorial: Poor oral intake, gastroparesis, uncontrolled diabetes, chronic diarrhea, medication nonadherence, poor self-care -albumin level low at 2.8. -Education officer, museum, diabetes coordinator and dietitian involved. -PT/OT consult placed.  Having difficulty placement--due to being undocumented and no payor source, and needs daily penile dressing change with packing  Insulin-dependent type II diabetes mellitus, Uncontrolled hyperglycemia -A1c 8.3 on 7/23. -Only on sliding scale insulin at home.  -on  Lantus 8 units daily and also continue sliding scale insulin.  Orthostatic hypotension appears due to diabetic autonomic neuropathy- BP going up-- hold midodrine  hyponatremia -Sodium level was low at 127 at presentation.   -Probably hypovolemic hyponatremia also with low solute intake and partly secondary to elevated blood glucose. -na 135  Chronic anemia, anemia of chronic disease. -Hemoglobin low at 8.6 at presentation.  Baseline with 9-10. -No active bleeding  but hemoglobin down to 7.2 today. -Continue to monitor.  Transfuse if less than 7.  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  Dyslipidemia -Home meds include Aspirin 81 mg daily, WelChol 1875 mg twice daily.   -Lipid panel 7/24 with HDL low at 20, LDL at 76.  Depression with Previous history of suicidal ideation -Denies suicidal ideation at this time.   -Home meds include Cogentin 0.5 mg twice daily, Cymbalta 30 mg daily, Haldol 0.5 mg daily.  Chronic diarrhea -Continue Imodium 2 mg twice daily as needed.  Gastroparesis due to DM-2 -Improving with Reglan.  Continue Protonix.  Chronic wounds--POA -Wound care consult noted   Procedures: suprapubic catheter placement Family communication : none Consults : urology CODE STATUS: full DVT Prophylaxis : heparin  Status is: Inpatient  Dispo:  Patient From: Home  Planned Disposition: most likely home. Patient has had challenges with being undocumented, no payer source. Home health has refused to accept him given poor noncompliance and follow-up. Pt has suprapubic catheter and needs daily penile shaft dressing--not sure how he is going to manage it.  Per TOC charity HH can only help few times--pt has poor social situation and no one in the family is ready to help him (per pt)--CHALLENGING discharge!  8/2>> patient will go home with charity home health, three and one commode, walker. Mother will be taught penile shaft dressing change.     medically stable for discharge: yes  TOTAL TIME TAKING CARE OF THIS PATIENT: *20* minutes.  >50% time spent on counselling and coordination of care  Note: This dictation was prepared with Dragon dictation along with smaller phrase technology. Any transcriptional errors that result from this process are unintentional.  Fritzi Mandes M.D     [facility]   CC: Primary care physician; [facility], IncPatient ID: Zerrick Hanssen, male   DOB: 12/12/1975, 44 y.o.   MRN: [id number]

## 2020-05-12 NOTE — Progress Notes (Signed)
Patient ID: Gregory Moody, male   DOB: 05-13-1976, 44 y.o.   MRN: [id number]  I had a long conversation with patient's mother in the room along with patient's nurse and [place] interpreter. Spoke with patient's sister earlier on mother baby floor where she is admitted.  According to the mother patient does not follow her suggestions at home. He stays in bed most of the time. He urinates and has bowel movement in his diapers and does not change it and throws it inappropriately which creates more work for mother and therefore mother had told patient that will be hard for her to keep him if he continues to do this behavior.  I had a long discussion with patient that he has to help himself, change his attitude and take care of him given that mother is there to help him. Patient did voice understanding. He also understands not much is available given his social situation and he being undocumented. Limited resources available.  Mother will taught how to do penile shaft dressing by nurse. Mother is agreeable to learn. Will give dressing supply to go home with  Care manager-- to order walker and three and one commode for home. Will try get charity home health once a week.  Patient will be discharged tomorrow. Mother aware.  Urology Dr. Rayburn Ma okay with patient going home and complete two week antibiotic course follow-up as outpatient  Per urology IR will increase periodically size of suprapubic catheter tube as outpatient.  Time spent 25 mins

## 2020-05-12 NOTE — Progress Notes (Signed)
Meeting was initiated by Dr. Posey Pronto with the presence of pt and mother. Medical plan was discussed. Pt and Mother was taught how to do dressing change including dressing change of suprapubic cath. Questions were answered. Pt also had his middle R toe nails off after he removed his socks, dry dressing was placed, MD was notified.

## 2020-05-12 NOTE — Plan of Care (Signed)
  Problem: Education: Goal: Knowledge of General Education information will improve Description Including pain rating scale, medication(s)/side effects and non-pharmacologic comfort measures Outcome: Progressing   

## 2020-05-13 DIAGNOSIS — R339 Retention of urine, unspecified: Secondary | ICD-10-CM

## 2020-05-13 LAB — GLUCOSE, CAPILLARY
Glucose-Capillary: 118 mg/dL — ABNORMAL HIGH (ref 70–99)
Glucose-Capillary: 118 mg/dL — ABNORMAL HIGH (ref 70–99)
Glucose-Capillary: 130 mg/dL — ABNORMAL HIGH (ref 70–99)

## 2020-05-13 LAB — CREATININE, SERUM
Creatinine, Ser: 2.31 mg/dL — ABNORMAL HIGH (ref 0.61–1.24)
GFR calc Af Amer: 38 mL/min — ABNORMAL LOW (ref >60)
GFR calc non Af Amer: 33 mL/min — ABNORMAL LOW (ref >60)

## 2020-05-13 MED ORDER — LEVOFLOXACIN 750 MG PO TABS: 750.0000 mg | ORAL_TABLET | ORAL | 0 refills | Status: AC

## 2020-05-13 MED ORDER — GLUCERNA SHAKE PO LIQD: 237.0000 mL | Freq: Three times a day (TID) | ORAL | 0 refills | Status: AC

## 2020-05-13 MED ORDER — INSULIN GLARGINE 100 UNIT/ML ~~LOC~~ SOLN: 8.0000 [IU] | Freq: Every day | SUBCUTANEOUS | 11 refills | Status: DC

## 2020-05-13 MED ORDER — ADULT MULTIVITAMIN W/MINERALS CH: 1.0000 | ORAL_TABLET | Freq: Every day | ORAL | 0 refills | Status: AC

## 2020-05-13 NOTE — Plan of Care (Signed)
  Problem: Clinical Measurements: Goal: Will remain free from infection Outcome: Progressing Goal: Respiratory complications will improve Outcome: Progressing Goal: Cardiovascular complication will be avoided Outcome: Progressing   

## 2020-05-13 NOTE — Progress Notes (Signed)
Dressing change was done with pt and mother. Questions were answered. Supplies were provided to pt.

## 2020-05-13 NOTE — Discharge Summary (Signed)
[facility] NAME: Gregory Moody    MR#:  [id number]  DATE OF BIRTH:  06-Jun-1976  DATE OF ADMISSION:  05/02/2020 ADMITTING PHYSICIAN: Terrilee Croak, MD  DATE OF DISCHARGE: 05/13/2020  PRIMARY CARE PHYSICIAN: [place]    ADMISSION DIAGNOSIS:  Weakness [R53.1] Hypotension, unspecified hypotension type [I95.9] FTT (failure to thrive) in adult [R62.7]  DISCHARGE DIAGNOSIS:  Penile Shaft abscess s/p packing with daily dressing changes Chronic Urinary Retention--now s/p Suprapubic catheter placement  SECONDARY DIAGNOSIS:   Past Medical History:  Diagnosis Date  . COVID-19   . Diabetes mellitus without complication ([place])   . Gastroparesis   . Tuberculosis     HOSPITAL COURSE:  Gregory Santiago-Gonzalezis a 44 y.o.malewith PMH significant for diabetes mellitus, gastroparesis, CKD stage 4, chronic anemia, tuberculosis, COVID-19 infection July 2020, chronic diarrhea, urinary retention, unintentional weight loss, severe protein calorie malnutrition and recurrent admissions for uncontrolled diabetes as well as a previous admission to behavioral health unit due to poor self-care. Patient presented to the ED on 7/23 for evaluation of weakness and dizziness and inability to tolerate p.o and reports being unable to care for himself. He lives with his mother, denies any active alcohol or drug use.   Urinary retention, penile shaft abscess--s/p packing of abscess cavity per Urology rec  No history of traumatic instrumentation. -Patient had a Foley catheter placed approximately a month ago -Urinary retention likely secondary to diabetic neuropathy, immobility. -Physical exam reveals abscess on the penile shaft that is draining -On Levaquin--total 14 days -7/27>>Seen by urology Dr brandon--now s/p suprapubic catheter with interventional radiology to bypass his urethra and to let penile abscess heal--pt will need  to f/u IR in 4-6 weeks to upsize his catheter. Urology PA will coordinate this as outpt. -7/29>> daily packing of the penile shaft by Urology/RN -8/2>> patient and mother taught to do dressing chage  Generalized weakness, failure to thrive, Severe protein calorie malnutrition -Multifactorial: Poor oral intake, gastroparesis, uncontrolled diabetes, chronic diarrhea, medication nonadherence, poor self-care -albumin level low at 2.8. -Education officer, museum, diabetes coordinator and dietitian involved. -PT/OT consult placed.Having difficulty placement--due to being undocumented and no payor source, and needs daily penile dressing change with packing -walker and 3 in 1 given  Insulin-dependent type II diabetes mellitus, Uncontrolled hyperglycemia -A1c 8.3 on 7/23. -Only on sliding scale insulin at home.  -on  Lantus 8 units daily and also continue sliding scale insulin.  Orthostatic hypotension appears due to diabetic autonomic neuropathy- BP going up-- hold midodrine  hyponatremia -Sodium level was low at 127 at presentation.  -Probably hypovolemic hyponatremia also with low solute intake and partly secondary to elevated blood glucose. -na 135  Chronic anemia, anemia of chronic disease. -Hemoglobin low at 8.6 at presentation. Baseline with 9-10. -No active bleeding but hemoglobin down to 7.2 today. -Continue to monitor. Transfuse if less than 7.  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  Dyslipidemia -Home meds include Aspirin 81 mg daily, WelChol 1875 mg twice daily.  -Lipid panel 7/24 with HDL low at 20, LDL at 76.  Depression with Previous history of suicidal ideation -Denies suicidal ideation at this time.  -Home meds include Cogentin 0.5 mg twice daily, Cymbalta 30 mg daily, Haldol 0.5 mg daily.  Chronic diarrhea -Continue Imodium 2 mg twice daily as  needed.  Gastroparesis due to DM-2 -Improving with Reglan. Continue Protonix.  Chronic wounds--POA -  Wound care consult noted   Procedures: suprapubic catheter placement Family communication : none Consults : urology CODE STATUS: full DVT Prophylaxis : heparin  Status is: Inpatient  Dispo: Patient From: Home             Planned Disposition: patient will go home with charity home health, three in  one commode, walker. Mother  Has been taught penile shaft dressing change.  Urology appt made. Urology will help make IR appt to upsize foley catheter size as out pt  Pt has had recurrent admissions in the past and is at a [facility] for readmission CONSULTS OBTAINED:  Treatment Team:  Hollice Espy, MD  DRUG ALLERGIES:  No Known Allergies  DISCHARGE MEDICATIONS:   Allergies as of 05/13/2020   No Known Allergies     Medication List    TAKE these medications   aspirin EC 81 MG tablet Take 81 mg by mouth daily.   benztropine 0.5 MG tablet Commonly known as: COGENTIN Take 1 tablet (0.5 mg total) by mouth 2 (two) times daily.   colesevelam 625 MG tablet Commonly known as: WELCHOL Take 3 tablets (1,875 mg total) by mouth 2 (two) times daily with a meal.   docusate sodium 100 MG capsule Commonly known as: COLACE Take 1 capsule (100 mg total) by mouth 2 (two) times daily as needed for mild constipation.   DULoxetine 30 MG capsule Commonly known as: CYMBALTA Take 1 capsule (30 mg total) by mouth daily.   feeding supplement (GLUCERNA SHAKE) Liqd Take 237 mLs by mouth 3 (three) times daily between meals.   haloperidol 0.5 MG tablet Commonly known as: HALDOL Take 1 tablet (0.5 mg total) by mouth 2 (two) times daily.   insulin aspart 100 UNIT/ML injection Commonly known as: novoLOG Inject 0-9 Units into the skin 3 (three) times daily before meals. 3 times daily before meals and at bedtime   insulin glargine 100 UNIT/ML injection Commonly known as:  LANTUS Inject 0.08 mLs (8 Units total) into the skin daily.   levofloxacin 750 MG tablet Commonly known as: LEVAQUIN Take 1 tablet (750 mg total) by mouth every other day for 16 days. Start taking on: May 15, 2020   loperamide 2 MG capsule Commonly known as: IMODIUM Take 1 capsule (2 mg total) by mouth 2 (two) times daily as needed for diarrhea or loose stools.   multivitamin with minerals Tabs tablet Take 1 tablet by mouth daily.   NexIUM 40 MG capsule Generic drug: esomeprazole Take 40 mg by mouth daily.   pantoprazole 40 MG tablet Commonly known as: PROTONIX Take 1 tablet (40 mg total) by mouth daily.   sodium bicarbonate 650 MG tablet Take 2 tablets (1,300 mg total) by mouth 2 (two) times daily.            Durable Medical Equipment  (From admission, onward)         Start     Ordered   05/12/20 1447  For home use only DME Walker rolling  Once       Question Answer Comment  Walker: With [place] Wheels   Patient needs a walker to treat with the following condition General weakness      05/12/20 1447           Discharge Care Instructions  (From admission, onward)         Start     Ordered   05/13/20 0000  Discharge wound care:       Comments:  As above   05/13/20 0910          If you experience worsening of your admission symptoms, develop shortness of breath, life threatening emergency, suicidal or homicidal thoughts you must seek medical attention immediately by calling 911 or calling your MD immediately  if symptoms less severe.  You Must read complete instructions/literature along with all the possible adverse reactions/side effects for all the Medicines you take and that have been prescribed to you. Take any new Medicines after you have completely understood and accept all the possible adverse reactions/side effects.   Please note  You were cared for by a hospitalist during your hospital stay. If you have any questions about your discharge  medications or the care you received while you were in the hospital after you are discharged, you can call the unit and asked to speak with the hospitalist on call if the hospitalist that took care of you is not available. Once you are discharged, your primary care physician will handle any further medical issues. Please note that NO REFILLS for any discharge medications will be authorized once you are discharged, as it is imperative that you return to your primary care physician (or establish a relationship with a primary care physician if you do not have one) for your aftercare needs so that they can reassess your need for medications and monitor your lab values. Today   SUBJECTIVE   No new complaints  VITAL SIGNS:  Blood pressure (!) 82/53, pulse 75, temperature 98.4 F (36.9 C), temperature source Oral, resp. rate 16, height 5\' 8"  (1.727 m), weight 50.1 kg, SpO2 100 %.  I/O:    Intake/Output Summary (Last 24 hours) at 05/13/2020 0911 Last data filed at 05/13/2020 0739 Gross per 24 hour  Intake 720 ml  Output 2750 ml  Net -2030 ml    PHYSICAL EXAMINATION:  GENERAL:  44 y.o.-year-old patient lying in the bed with no acute distress. Thin cachectic malnourished EYES: Pupils equal, round, reactive to light and accommodation. No scleral icterus.   HEENT: Head atraumatic, normocephalic. Oropharynx and nasopharynx clear.  NECK:  Supple, no jugular venous distention. No thyroid enlargement, no tenderness.  LUNGS: Normal breath sounds bilaterally, no wheezing, rales, rhonchi. No use of accessory muscles of respiration.  CARDIOVASCULAR: S1, S2 normal. No murmurs, rubs, or gallops.  ABDOMEN: Soft, nontender, nondistended. Bowel sounds present. No organomegaly or mass.  EXTREMITIES: No cyanosis, clubbing or edema b/l.    NEUROLOGIC: Cranial nerves II through XII are intact. No focal Motor or sensory deficits b/l. Generalized ability PSYCHIATRIC:  patient is alert and oriented x 3.  SKIN: patient  has bruises skin tears from previous injuries at home Pressure Injury 12/28/19 Elbow Left Stage 2 -  Partial thickness loss of dermis presenting as a shallow open injury with a red, pink wound bed without slough. (Active)  12/28/19 1422  Location: Elbow  Location Orientation: Left  Staging: Stage 2 -  Partial thickness loss of dermis presenting as a shallow open injury with a red, pink wound bed without slough.  Wound Description (Comments):   Present on Admission: Yes (Present on transfer to 1A)     Pressure Injury 12/28/19 Elbow Right Stage 3 -  Full thickness tissue loss. Subcutaneous fat may be visible but bone, tendon or muscle are NOT exposed. (Active)  12/28/19 1422  Location: Elbow  Location Orientation: Right  Staging: Stage 3 -  Full thickness tissue loss. Subcutaneous fat may be visible but bone, tendon or muscle  are NOT exposed.  Wound Description (Comments):   Present on Admission: Yes (Present on transfer to 1A)     DATA REVIEW:   CBC  Recent Labs  Lab 05/07/20 0519  WBC 8.1  HGB 7.9*  HCT 24.4*  PLT 462*    Chemistries  Recent Labs  Lab 05/07/20 0519 05/07/20 0519 05/13/20 0454  NA 135  --   --   K 4.4  --   --   CL 110  --   --   CO2 16*  --   --   GLUCOSE 220*  --   --   BUN 34*  --   --   CREATININE 2.48*   < > 2.31*  CALCIUM 7.7*  --   --    < > = values in this interval not displayed.    Microbiology Results   Recent Results (from the past 240 hour(s))  Aerobic/Anaerobic Culture (surgical/deep wound)     Status: Abnormal   Collection Time: 05/05/20 11:12 AM   Specimen: Penis; Abscess  Result Value Ref Range Status   Specimen Description   Final    PENIS Performed at [facility], [address], [place], [place] [address]    Special Requests   Final    NONE Performed at [facility] [place], [place] [address]    Gram Stain   Final    Emmit [place] POSITIVE COCCI ABUNDANT GRAM NEGATIVE  RODS MODERATE GRAM POSITIVE RODS    Culture (A)  Final    MULTIPLE ORGANISMS PRESENT, NONE PREDOMINANT NO ANAEROBES ISOLATED Performed at [facility], [place] [address] [place], [place] [address]    Report Status 05/10/2020 FINAL  Final    RADIOLOGY:  No results found.   CODE STATUS:     Code Status Orders  (From admission, onward)         Start     Ordered   05/02/20 1912  Full code  Continuous        05/02/20 1914        Code Status History    Date Active Date Inactive Code Status Order ID Comments User Context   04/02/2020 1636 04/09/2020 2319 Full Code [id number]  Jennye Boroughs, MD Inpatient   03/31/2020 2303 04/02/2020 1553 Full Code [id number]  Eulas Post, MD Inpatient   03/24/2020 1548 03/31/2020 2300 Full Code [id number]  Flora Lipps, MD ED   02/28/2020 1707 03/02/2020 2059 Full Code [id number]  Lorella Nimrod, MD ED   02/06/2020 2250 02/09/2020 1826 Full Code [id number]  Athena Masse, MD ED   01/21/2020 1833 01/30/2020 1900 Full Code [id number]  Ivor Costa, MD Inpatient   12/27/2019 1413 01/03/2020 1931 Full Code [id number]  Collier Bullock, MD ED   04/25/2019 0032 04/26/2019 2018 Full Code [id number]  Bernadette Hoit, DO Inpatient   04/24/2019 2311 04/25/2019 0032 Full Code [id number]  Bernadette Hoit, DO Inpatient   04/24/2019 0127 04/24/2019 1952 Full Code [id number]  Lance Coon, MD Inpatient   04/14/2019 1325 04/20/2019 1926 Full Code [id number]  Lady Deutscher, MD Inpatient   04/02/2019 0123 04/03/2019 1848 Full Code [id number]  Mansy, Arvella Merles, MD ED   03/30/2019 2349 04/01/2019 1809 Full Code [id number]  Mayer Camel, NP ED   03/06/2019 2256 03/07/2019 2039 Full Code [id number]  Mayer Camel, NP ED   02/14/2019 0456 02/15/2019 2030 Full Code [id number]  Harrie Foreman, MD Inpatient   02/02/2019 1729 02/04/2019 1804 Full Code  [id number]  Hillary Bow, MD ED   01/03/2019 1944 01/04/2019 2003 Full Code [id number]  Gorden Harms, MD Inpatient   02/23/2018 0126 02/24/2018 1539  Full Code [id number]  Lance Coon, MD ED   04/22/2017 0004 04/22/2017 2213 Full Code [id number]  Lance Coon, MD Inpatient   04/17/2017 2234 04/19/2017 2005 Full Code [id number]  [name], MD Inpatient   04/06/2016 1907 04/10/2016 1928 Full Code [id number]  Loletha Grayer, MD ED   Advance Care Planning Activity       TOTAL TIME TAKING CARE OF THIS PATIENT: *35* minutes.    Fritzi Mandes M.D  [facility]    CC: Primary care physician; [place]

## 2020-05-13 NOTE — TOC Progression Note (Addendum)
Transition of Care [facility]) - Progression Note    Patient Details  Name: Gregory Moody MRN: [id number] Date of Birth: 12-28-1975  [facility] CM/SW Contact  Su Hilt, RN Phone Number: 05/13/2020, 10:05 AM  Clinical Narrative:  Clabe Seal with [facility] of the need for [facility], The patient is discharging home to his mother's today, she has been taught to do the dressing change and care for the Suprapubic catheter, He has transportation with his mother  The 3 in 1 and RW are in the room from [employer] to be taken home with him         Expected Discharge Plan and Services           Expected Discharge Date: 05/13/20                                     Social Determinants of Health (SDOH) Interventions    Readmission Risk Interventions Readmission Risk Prevention Plan 05/09/2020 04/09/2020 03/31/2020  Transportation Screening Complete Complete Complete  PCP or Specialist Appt within 3-5 Days - - -  Social Work Consult for [place] - - -  Medication Review Press photographer) Complete Complete Complete  PCP or Specialist appointment within 3-5 days of discharge - Patient refused -  [place] or [place] - Not Complete Complete  SW Recovery Care/Counseling Consult - - Complete  Palliative Care Screening - Complete Not [place] Not Applicable Not Applicable Not Applicable  Some recent data might be hidden

## 2020-05-16 ENCOUNTER — Other Ambulatory Visit: Payer: Self-pay | Admitting: Radiology

## 2020-05-16 DIAGNOSIS — N319 Neuromuscular dysfunction of bladder, unspecified: Secondary | ICD-10-CM

## 2020-05-16 DIAGNOSIS — R339 Retention of urine, unspecified: Secondary | ICD-10-CM

## 2020-05-20 ENCOUNTER — Telehealth: Payer: Self-pay | Admitting: Radiology

## 2020-05-20 LAB — GLUCOSE, CAPILLARY: Glucose-Capillary: 134 mg/dL — ABNORMAL HIGH (ref 70–99)

## 2020-05-20 NOTE — Telephone Encounter (Signed)
Unable to reach patient or sister by phone. Need to notify patient of suprapubic tube exchange in IR on 06/06/2020 with arrival time of 7:30am at Admitting and Registration Desk at [facility].

## 2020-05-23 NOTE — Telephone Encounter (Signed)
Unable to reach patient or sister by phone. Recording stated unable to take call at this time.

## 2020-05-25 ENCOUNTER — Other Ambulatory Visit: Payer: Self-pay

## 2020-05-25 ENCOUNTER — Encounter: Payer: Self-pay | Admitting: Intensive Care

## 2020-05-25 ENCOUNTER — Emergency Department
Admission: EM | Admit: 2020-05-25 | Discharge: 2020-05-26 | Disposition: A | Discharge status: Home / Self Care | Payer: Self-pay | Attending: Emergency Medicine | Admitting: Emergency Medicine

## 2020-05-25 DIAGNOSIS — E1110 Type 2 diabetes mellitus with ketoacidosis without coma: Secondary | ICD-10-CM | POA: Insufficient documentation

## 2020-05-25 DIAGNOSIS — R531 Weakness: Secondary | ICD-10-CM | POA: Insufficient documentation

## 2020-05-25 DIAGNOSIS — Z794 Long term (current) use of insulin: Secondary | ICD-10-CM | POA: Insufficient documentation

## 2020-05-25 DIAGNOSIS — F17210 Nicotine dependence, cigarettes, uncomplicated: Secondary | ICD-10-CM | POA: Insufficient documentation

## 2020-05-25 DIAGNOSIS — N184 Chronic kidney disease, stage 4 (severe): Secondary | ICD-10-CM | POA: Insufficient documentation

## 2020-05-25 DIAGNOSIS — F17290 Nicotine dependence, other tobacco product, uncomplicated: Secondary | ICD-10-CM | POA: Insufficient documentation

## 2020-05-25 DIAGNOSIS — D649 Anemia, unspecified: Secondary | ICD-10-CM | POA: Insufficient documentation

## 2020-05-25 LAB — CBC
HCT: 21 % — ABNORMAL LOW (ref 39.0–52.0)
Hemoglobin: 7.1 g/dL — ABNORMAL LOW (ref 13.0–17.0)
MCH: 30.1 pg (ref 26.0–34.0)
MCHC: 33.8 g/dL (ref 30.0–36.0)
MCV: 89 fL (ref 80.0–100.0)
Platelets: 347 10*3/uL (ref 150–400)
RBC: 2.36 MIL/uL — ABNORMAL LOW (ref 4.22–5.81)
RDW: 16 % — ABNORMAL HIGH (ref 11.5–15.5)
WBC: 4 10*3/uL (ref 4.0–10.5)
nRBC: 0 % (ref 0.0–0.2)

## 2020-05-25 LAB — BASIC METABOLIC PANEL
Anion gap: 7 (ref 5–15)
BUN: 38 mg/dL — ABNORMAL HIGH (ref 6–20)
CO2: 19 mmol/L — ABNORMAL LOW (ref 22–32)
Calcium: 8.8 mg/dL — ABNORMAL LOW (ref 8.9–10.3)
Chloride: 106 mmol/L (ref 98–111)
Creatinine, Ser: 2.52 mg/dL — ABNORMAL HIGH (ref 0.61–1.24)
GFR calc Af Amer: 34 mL/min — ABNORMAL LOW (ref >60)
GFR calc non Af Amer: 30 mL/min — ABNORMAL LOW (ref >60)
Glucose, Bld: 209 mg/dL — ABNORMAL HIGH (ref 70–99)
Potassium: 4.1 mmol/L (ref 3.5–5.1)
Sodium: 132 mmol/L — ABNORMAL LOW (ref 135–145)

## 2020-05-25 MED ORDER — SODIUM CHLORIDE 0.9 % IV BOLUS
1000.0000 mL | Freq: Once | INTRAVENOUS | Status: AC
  Administered 2020-05-25: 1000 mL via INTRAVENOUS

## 2020-05-25 NOTE — ED Provider Notes (Signed)
[facility] Emergency Department Provider Note   ____________________________________________   First MD Initiated Contact with Patient 05/25/20 2356     (approximate)  I have reviewed the triage vital signs and the nursing notes.   HISTORY  Chief Complaint Dizziness    HPI Gregory Moody is a 44 y.o. male with possible history of diabetes, gastroparesis, urinary retention with suprapubic catheter, CKD, chronic anemia, and chronic diarrhea who presents to the ED complaining of generalized weakness.  Patient reports that he has been feeling increasingly weak over the past couple of days with a poor appetite.  He will feel nauseous when he attempts to eat, but denies any vomiting and has not had any abdominal pain.  He has not had any fevers, denies any cough, chest pain, shortness of breath, or changes in his chronic diarrhea.  His suprapubic catheter has been functioning well with good urine output, he is also being currently treated for penile abscesses, states the area has seemed to be healing well.  His mother has been performing dressing changes at home and he reports being compliant with course of Levaquin.  He reports occasional bleeding from his penile wounds, but no active bleeding and no blood in his stool.        Past Medical History:  Diagnosis Date  . COVID-19   . Diabetes mellitus without complication ([place])   . Gastroparesis   . Tuberculosis     Patient Active Problem List   Diagnosis Date Noted  . Neurogenic bladder   . Penile abscess 05/06/2020  . FTT (failure to thrive) in adult 05/03/2020  . Weakness 05/02/2020  . CKD (chronic kidney disease), stage IV ([place]) 05/02/2020  . Type 1 diabetes mellitus with diabetic chronic kidney disease ([place])   . Goals of care, counseling/discussion   . Palliative care by specialist   . Recurrent major depression-severe ([place]) 03/31/2020  . Hyperkalemia   . Iron deficiency anemia   . Weight  loss   . Chronic diarrhea 02/07/2020  . Abnormal CT of the chest   . Acute urinary retention 02/06/2020  . Hyperglycemia due to type 2 diabetes mellitus ([place]) 02/06/2020  . History of Clostridium difficile colitis 02/06/2020  . Generalized weakness 02/06/2020  . History of COVID-19 02/06/2020  . Diarrhea 01/21/2020  . Tobacco abuse 01/21/2020  . C. difficile colitis 01/21/2020  . Urinary retention 01/21/2020  . Diabetes mellitus without complication ([place]) 16/96/7893  . Protein-calorie malnutrition, severe 01/02/2020  . Odynophagia   . DKA (diabetic ketoacidoses) ([place]) 12/27/2019  . Depression 12/27/2019  . DKA, type 2 ([place]) 12/27/2019  . Anemia of chronic disease 04/25/2019  . Hypomagnesemia 04/25/2019  . Hypotension 04/23/2019  . CAP (community acquired pneumonia) due to MSSA (methicillin sensitive Staphylococcus aureus) ([place]) 04/14/2019  . COVID-19 virus infection 04/14/2019  . Pressure injury of skin 03/31/2019  . Acute renal failure (ARF) ([place]) 03/06/2019  . CAP (community acquired pneumonia) 02/14/2019  . AKI (acute kidney injury) ([place]) 02/02/2019  . ARF (acute renal failure) ([place]) 01/03/2019  . Malnutrition of moderate degree 02/24/2018  . GERD (gastroesophageal reflux disease) 02/23/2018  . Diabetic gastroparesis ([place]) 02/23/2018  . Diabetic foot ulcer ([place]) 02/23/2018  . Aspiration pneumonia ([place]) 04/21/2017  . Hypoglycemia 04/21/2017  . Diabetes mellitus with hyperglycemia ([place]) 04/21/2017  . Hip fracture, unspecified laterality, closed, initial encounter ([place]) 04/17/2017  . Hyperglycemia 04/17/2017  . Malnutrition ([place]) 04/09/2016  . Cavitary lesion of lung 04/06/2016    Past Surgical History:  Procedure  Laterality Date  . COLONOSCOPY N/A 01/25/2020   Procedure: COLONOSCOPY;  Surgeon: Toledo, Benay Pike, MD;  Location: [facility] ENDOSCOPY;  Service: Gastroenterology;  Laterality: N/A;  . ESOPHAGOGASTRODUODENOSCOPY N/A 02/03/2019   Procedure: ESOPHAGOGASTRODUODENOSCOPY  (EGD);  Surgeon: Toledo, Benay Pike, MD;  Location: [facility] ENDOSCOPY;  Service: Gastroenterology;  Laterality: N/A;    Prior to Admission medications   Medication Sig Start Date End Date Taking? Authorizing Provider  aspirin EC 81 MG tablet Take 81 mg by mouth daily.    [provider]  benztropine (COGENTIN) 0.5 MG tablet Take 1 tablet (0.5 mg total) by mouth 2 (two) times daily. 04/09/20   Pokhrel, Corrie Mckusick, MD  colesevelam (WELCHOL) 625 MG tablet Take 3 tablets (1,875 mg total) by mouth 2 (two) times daily with a meal. 02/09/20   Loletha Grayer, MD  docusate sodium (COLACE) 100 MG capsule Take 1 capsule (100 mg total) by mouth 2 (two) times daily as needed for mild constipation. 03/28/20   Lorella Nimrod, MD  DULoxetine (CYMBALTA) 30 MG capsule Take 1 capsule (30 mg total) by mouth daily. 04/10/20   Pokhrel, Corrie Mckusick, MD  feeding supplement, GLUCERNA SHAKE, (GLUCERNA SHAKE) LIQD Take 237 mLs by mouth 3 (three) times daily between meals. 05/13/20   Fritzi Mandes, MD  ferrous sulfate 325 (65 FE) MG EC tablet Take 1 tablet (325 mg total) by mouth 2 (two) times daily. 05/26/20 09/23/20  Blake Divine, MD  haloperidol (HALDOL) 0.5 MG tablet Take 1 tablet (0.5 mg total) by mouth 2 (two) times daily. 04/09/20   Pokhrel, Corrie Mckusick, MD  insulin aspart (NOVOLOG) 100 UNIT/ML injection Inject 0-9 Units into the skin 3 (three) times daily before meals. 3 times daily before meals and at bedtime    [provider]  insulin glargine (LANTUS) 100 UNIT/ML injection Inject 0.08 mLs (8 Units total) into the skin daily. 05/13/20   Fritzi Mandes, MD  levofloxacin (LEVAQUIN) 750 MG tablet Take 1 tablet (750 mg total) by mouth every other day for 16 days. 05/15/20 05/31/20  Fritzi Mandes, MD  loperamide (IMODIUM) 2 MG capsule Take 1 capsule (2 mg total) by mouth 2 (two) times daily as needed for diarrhea or loose stools. 02/09/20   Loletha Grayer, MD  Multiple Vitamin (MULTIVITAMIN WITH MINERALS) TABS tablet Take 1 tablet by  mouth daily. 05/13/20   Fritzi Mandes, MD  NEXIUM 40 MG capsule Take 40 mg by mouth daily. 04/21/20   [provider]  pantoprazole (PROTONIX) 40 MG tablet Take 1 tablet (40 mg total) by mouth daily. 04/03/20   Clapacs, Madie Reno, MD  sodium bicarbonate 650 MG tablet Take 2 tablets (1,300 mg total) by mouth 2 (two) times daily. 04/09/20   Pokhrel, Corrie Mckusick, MD    Allergies Patient has no known allergies.  Family History  Problem Relation Age of Onset  . Diabetes Mother   . Diabetes Father     Social History Social History   Tobacco Use  . Smoking status: Current Some Day Smoker    Types: Cigars, Cigarettes  . Smokeless tobacco: Never Used  Vaping Use  . Vaping Use: Never used  Substance Use Topics  . Alcohol use: No  . Drug use: No    Review of Systems  Constitutional: No fever/chills.  Positive for generalized weakness. Eyes: No visual changes. ENT: No sore throat. Cardiovascular: Denies chest pain. Respiratory: Denies shortness of breath. Gastrointestinal: No abdominal pain.  No nausea, no vomiting.  Positive for diarrhea.  No constipation. Genitourinary: Negative for dysuria. Musculoskeletal:  Negative for back pain. Skin: Negative for rash. Neurological: Negative for headaches, focal weakness or numbness.  ____________________________________________   PHYSICAL EXAM:  VITAL SIGNS: ED Triage Vitals  Enc Vitals Group     BP 05/25/20 1453 (!) 74/63     Pulse Rate 05/25/20 1453 70     Resp 05/25/20 1453 16     Temp 05/25/20 1453 98.9 F (37.2 C)     Temp Source 05/25/20 1453 Oral     SpO2 05/25/20 1453 100 %     Weight 05/25/20 1454 100 lb (45.4 kg)     Height 05/25/20 1454 5\' 8"  (1.727 m)     Head Circumference --      Peak Flow --      Pain Score 05/25/20 1454 0     Pain Loc --      Pain Edu? --      Excl. in [place]? --     Constitutional: Alert and oriented.  Thin and cachectic appearing. Eyes: Conjunctivae are normal. Head: Atraumatic. Nose: No  congestion/rhinnorhea. Mouth/Throat: Mucous membranes are moist. Neck: Normal ROM Cardiovascular: Normal rate, regular rhythm. Grossly normal heart sounds. Respiratory: Normal respiratory effort.  No retractions. Lungs CTAB. Gastrointestinal: Soft and nontender. No distention. Genitourinary: Suprapubic catheter in place draining clear yellow urine.  Incision and drainage sites to distal penis with packing in place, no associated erythema, warmth, or tenderness to palpation. Musculoskeletal: No lower extremity tenderness nor edema. Neurologic:  Normal speech and language. No gross focal neurologic deficits are appreciated. Skin:  Skin is warm, dry and intact. No rash noted. Psychiatric: Mood and affect are normal. Speech and behavior are normal.  ____________________________________________   LABS (all labs ordered are listed, but only abnormal results are displayed)  Labs Reviewed  BASIC METABOLIC PANEL - Abnormal; Notable for the following components:      Result Value   Sodium 132 (*)    CO2 19 (*)    Glucose, Bld 209 (*)    BUN 38 (*)    Creatinine, Ser 2.52 (*)    Calcium 8.8 (*)    GFR calc non Af Amer 30 (*)    GFR calc Af Amer 34 (*)    All other components within normal limits  CBC - Abnormal; Notable for the following components:   RBC 2.36 (*)    Hemoglobin 7.1 (*)    HCT 21.0 (*)    RDW 16.0 (*)    All other components within normal limits  URINALYSIS, COMPLETE (UACMP) WITH MICROSCOPIC - Abnormal; Notable for the following components:   Color, Urine YELLOW (*)    APPearance CLOUDY (*)    Hgb urine dipstick SMALL (*)    Protein, ur 30 (*)    Leukocytes,Ua LARGE (*)    WBC, UA >50 (*)    Bacteria, UA RARE (*)    All other components within normal limits  URINE CULTURE  TYPE AND SCREEN  PREPARE RBC (CROSSMATCH)   ____________________________________________  EKG  ED ECG REPORT I, Blake Divine, the attending physician, personally viewed and interpreted  this ECG.   Date: 05/26/2020  EKG Time: 15:09  Rate: 67  Rhythm: normal sinus rhythm  Axis: RAD  Intervals:none  ST&T Change: None   PROCEDURES  Procedure(s) performed (including Critical Care):  .Critical Care Performed by: Blake Divine, MD Authorized by: Blake Divine, MD   Critical care provider statement:    Critical care time (minutes):  45   Critical care time was exclusive of:  Separately billable procedures and treating other patients and teaching time   Critical care was necessary to treat or prevent imminent or life-threatening deterioration of the following conditions:  Circulatory failure   Critical care was time spent personally by me on the following activities:  Discussions with consultants, evaluation of patient's response to treatment, examination of patient, ordering and performing treatments and interventions, ordering and review of laboratory studies, ordering and review of radiographic studies, pulse oximetry, re-evaluation of patient's condition, obtaining history from patient or surrogate and review of old charts   I assumed direction of critical care for this patient from another provider in my specialty: no       ____________________________________________   INITIAL IMPRESSION / ASSESSMENT AND PLAN / ED COURSE       44 year old male with past medical history of poorly controlled diabetes, gastroparesis, CKD, chronic urinary retention status post suprapubic catheter, and chronic anemia who presents to the ED complaining of generalized weakness with poor appetite over the past couple of days.  He states he will feel nauseous at times, but is not currently nauseated, has not vomited, and denies any abdominal pain.  His abdominal exam is reassuring, suprapubic catheter in place and appears to be functioning appropriately.  He was initially hypotensive, but this improved with IV fluids and I suspect is due to dehydration, lab work not consistent with  sepsis.  His penile abscesses also appear to be well-healing with packing in place.  Lab work thus far shows patient's baseline chronic kidney disease, he is mildly hyperglycemic but no evidence of DKA.  He does have acute on chronic anemia with mild downtrending in hemoglobin, but no evidence of active bleeding.  His stool here in the ED is loose and light yellow, do not suspect C. difficile as this is chronic for patient.  Given his generalized weakness with worsening hemoglobin, we will transfuse 1 unit PRBCs.  Plan to p.o. trial and reassess his generalized weakness following transfusion, however he is able to stand and bear with his weight at this time.  His strength currently seems to be near his baseline, as he previously has been able to stand up and ambulate to chair on his own but is chronically weak.  If he is able to tolerate p.o. and tolerates transfusion without difficulty, he will be appropriate for discharge home.  Patient has had a significant amount to eat here in the ED with no difficulty tolerating this, continues to deny nausea or abdominal pain.  After completing transfusion, he states he feels better with some improved strength.  He is able to stand and ambulate short distances, which seems to be his baseline.  He has a very difficult social situation given his undocumented status and poorly controlled diabetes.  He is currently receiving home health care and is appropriate for discharge home with urology and hematology follow-up, I will prescribe iron supplementation for his chronic anemia.  He was counseled to return to the ED for new worsening symptoms, patient agrees with plan.      ____________________________________________   FINAL CLINICAL IMPRESSION(S) / ED DIAGNOSES  Final diagnoses:  Generalized weakness  Symptomatic anemia     ED Discharge Orders         Ordered    ferrous sulfate 325 (65 FE) MG EC tablet  2 times daily     Discontinue  Reprint     05/26/20  0621           Note:  This document  was prepared using Systems analyst and may include unintentional dictation errors.   Blake Divine, MD 05/26/20 [phone]

## 2020-05-25 NOTE — ED Triage Notes (Signed)
Patient brought in [place] by cousin. Interpretor on a stick being used for triage. C/o weakness, loss of appetite, emesis X3days. Patient reports he has been crawling around at home to get around due to weakness. Baseline can ambulate. A&O x4 in triage

## 2020-05-26 LAB — URINALYSIS, COMPLETE (UACMP) WITH MICROSCOPIC
Bilirubin Urine: NEGATIVE
Glucose, UA: NEGATIVE mg/dL
Ketones, ur: NEGATIVE mg/dL
Nitrite: NEGATIVE
Protein, ur: 30 mg/dL — AB
Specific Gravity, Urine: 1.009 (ref 1.005–1.030)
WBC, UA: >50 WBC/hpf — ABNORMAL HIGH (ref 0–5)
pH: 5 (ref 5.0–8.0)

## 2020-05-26 LAB — URINE CULTURE: Culture: 40000 — AB

## 2020-05-26 LAB — PREPARE RBC (CROSSMATCH)

## 2020-05-26 MED ORDER — SODIUM CHLORIDE 0.9 % IV SOLN: 10.0000 mL/h | Freq: Once | INTRAVENOUS | Status: DC

## 2020-05-26 MED ORDER — FERROUS SULFATE 325 (65 FE) MG PO TBEC: 325.0000 mg | DELAYED_RELEASE_TABLET | Freq: Two times a day (BID) | ORAL | 3 refills | Status: DC

## 2020-05-26 MED ORDER — FERROUS SULFATE 325 (65 FE) MG PO TBEC
325.0000 mg | DELAYED_RELEASE_TABLET | Freq: Two times a day (BID) | ORAL | 3 refills | Status: DC
Start: 2020-05-26 — End: 2020-05-26

## 2020-05-26 NOTE — ED Notes (Signed)
Clean dressing applied to L elbow with xeroform gauze, clean dressing applied to suprapubic catheter. Pt cleansed of stool incontinence at this time, pt placed in blue paper scrubs at this time. Pt A&O, pt tolerated well. Interpreter paged to review D/C instructions, transfer of medical necessity given to [facility], Network engineer to call for EMS to transport home.

## 2020-05-26 NOTE — ED Notes (Signed)
Report from [place]. Patient resting quietly. Tolerated blood transfusion well.

## 2020-05-26 NOTE — ED Notes (Signed)
Pt was upset because the ambulance is taking to long to pick him up. I explained they are on there time and will be here as soon as they can. He wanted to call family to come pick him up instead, so I provided him with a phone to call family. Pt didn't want to wait any longer. Reported to RN Jinny Blossom what was being done.

## 2020-05-26 NOTE — ED Notes (Signed)
Dr. Corky Downs notified to speak with patient.

## 2020-05-26 NOTE — ED Notes (Signed)
EDP at bedside to speak with patient. Ronnald Collum, interpreter at bedside with EDP.

## 2020-05-26 NOTE — ED Notes (Addendum)
Pt arrived to the room in a wheelchair, suprapubic catheter in place, found to be incontinent of stool.  Stool was light brown and liquid consistency with no solid pieces.  Brief was changed and pericare provided.  Penis was dressed in a bandage due to an infection, and pt states he is currently on antibiotics for the infection.  Pt stood and pivoted  to get into stretcher, states he is very weak and has had nausea for the past few days.  Since arriving to the room, the pt has eaten a sandwich tray, graham crackers, juice, sprite, and peanut butter.  He denies nausea at this time.  Denies pain.

## 2020-05-26 NOTE — ED Notes (Signed)
This RN verified with Dr. Corky Downs, Ronnald Collum, Interpreter and Bubba Hales, RN that patient in agreement to go home, that his mother at home to receive patient when he gets home via EMS.

## 2020-05-26 NOTE — ED Notes (Signed)
NAD noted at time of D/C. Pt transported him via [facility]. VS stable at time of D/C. Verbal consent for D/C obtained by this RN.

## 2020-05-26 NOTE — ED Notes (Signed)
This RN to bedside due to patient calling out, pt brought [place] sandwich tray and phone. Pt states, "I don't want that shit". This RN explained that was the only food this RN had to offer patient at this time. This RN explained patient was not eligible to order food from cafeteria unless he was admitted. Explained EDP was notified that patient wanted to speak with him however EDP also seeing other patients at this time. This RN apologized for delay. Pt repeatedly stating "I don't like that shit", this RN apologized, repeatedly explained [place] sandwich tray only food this RN had to offer patient at this time.

## 2020-05-26 NOTE — ED Notes (Signed)
D/C instructions reviewed with patient by this RN and Ronnald Collum, [employer]. Pt requesting printed paper prescription for discharge. Pt denies comments/concerns regarding D/C instructions. Informed patient that EMS would be transporting patient home as discussed.

## 2020-05-26 NOTE — ED Notes (Signed)
Patient asking when his breakfast tray will be served. I told him it would be served around 0800. States he will let me know what he wants to order.

## 2020-05-27 LAB — TYPE AND SCREEN
ABO/RH(D): O POS
Antibody Screen: NEGATIVE
Unit division: 0

## 2020-05-27 LAB — BPAM RBC
Blood Product Expiration Date: 202109162359
ISSUE DATE / TIME: 202108160238
Unit Type and Rh: 5100

## 2020-05-29 ENCOUNTER — Telehealth: Payer: Self-pay

## 2020-05-29 NOTE — Telephone Encounter (Signed)
Left message with [name] to have Gregory Moody return call. Need to notify her of appointment for SPT exchange on 06/06/2020 with arrival time of 7:30. Patient will need to go to Admitting and Registration at [facility]. He does not have to be npo.

## 2020-05-29 NOTE — Telephone Encounter (Signed)
Gerald from [facility] with Dr. Reather Laurence called wanting a call back about cath care and exchange information for patient. Her call back number is [phone]

## 2020-05-29 NOTE — Telephone Encounter (Signed)
Per Percell Locus, patient is not compliant with follow up. She will try to contact patient to discuss importance of keeping appointments.

## 2020-06-04 ENCOUNTER — Encounter: Payer: Self-pay | Admitting: Physician Assistant

## 2020-06-04 ENCOUNTER — Ambulatory Visit: Payer: Self-pay | Admitting: Physician Assistant

## 2020-06-04 NOTE — Telephone Encounter (Signed)
Discussed appointment with mother, [name].

## 2020-06-06 ENCOUNTER — Ambulatory Visit: Admission: RE | Admit: 2020-06-06 | Payer: MEDICAID | Source: Ambulatory Visit

## 2020-06-09 ENCOUNTER — Encounter: Payer: Self-pay | Admitting: Physician Assistant

## 2020-06-09 ENCOUNTER — Inpatient Hospital Stay
Admission: EM | Admit: 2020-06-09 | Discharge: 2020-06-19 | DRG: 698 | Disposition: A | Discharge status: Home / Self Care | Payer: Self-pay | Attending: Internal Medicine | Admitting: Internal Medicine

## 2020-06-09 ENCOUNTER — Emergency Department: Payer: Self-pay

## 2020-06-09 ENCOUNTER — Other Ambulatory Visit: Payer: Self-pay

## 2020-06-09 ENCOUNTER — Ambulatory Visit (INDEPENDENT_AMBULATORY_CARE_PROVIDER_SITE_OTHER): Payer: Self-pay | Admitting: Physician Assistant

## 2020-06-09 ENCOUNTER — Encounter: Payer: Self-pay | Admitting: Emergency Medicine

## 2020-06-09 VITALS — BP 100/68 | HR 70 | Ht 69.0 in | Wt 100.0 lb

## 2020-06-09 DIAGNOSIS — E43 Unspecified severe protein-calorie malnutrition: Secondary | ICD-10-CM | POA: Diagnosis present

## 2020-06-09 DIAGNOSIS — K529 Noninfective gastroenteritis and colitis, unspecified: Secondary | ICD-10-CM | POA: Diagnosis present

## 2020-06-09 DIAGNOSIS — I959 Hypotension, unspecified: Secondary | ICD-10-CM

## 2020-06-09 DIAGNOSIS — Z7982 Long term (current) use of aspirin: Secondary | ICD-10-CM

## 2020-06-09 DIAGNOSIS — K3184 Gastroparesis: Secondary | ICD-10-CM | POA: Diagnosis present

## 2020-06-09 DIAGNOSIS — Z681 Body mass index (BMI) 19 or less, adult: Secondary | ICD-10-CM

## 2020-06-09 DIAGNOSIS — Y846 Urinary catheterization as the cause of abnormal reaction of the patient, or of later complication, without mention of misadventure at the time of the procedure: Secondary | ICD-10-CM | POA: Diagnosis present

## 2020-06-09 DIAGNOSIS — D631 Anemia in chronic kidney disease: Secondary | ICD-10-CM | POA: Diagnosis present

## 2020-06-09 DIAGNOSIS — Z79899 Other long term (current) drug therapy: Secondary | ICD-10-CM

## 2020-06-09 DIAGNOSIS — R197 Diarrhea, unspecified: Secondary | ICD-10-CM | POA: Diagnosis present

## 2020-06-09 DIAGNOSIS — R680 Hypothermia, not associated with low environmental temperature: Secondary | ICD-10-CM | POA: Diagnosis present

## 2020-06-09 DIAGNOSIS — N390 Urinary tract infection, site not specified: Secondary | ICD-10-CM | POA: Diagnosis present

## 2020-06-09 DIAGNOSIS — Z91011 Allergy to milk products: Secondary | ICD-10-CM

## 2020-06-09 DIAGNOSIS — E1165 Type 2 diabetes mellitus with hyperglycemia: Secondary | ICD-10-CM | POA: Diagnosis present

## 2020-06-09 DIAGNOSIS — A4151 Sepsis due to Escherichia coli [E. coli]: Secondary | ICD-10-CM | POA: Diagnosis present

## 2020-06-09 DIAGNOSIS — N179 Acute kidney failure, unspecified: Secondary | ICD-10-CM

## 2020-06-09 DIAGNOSIS — R112 Nausea with vomiting, unspecified: Secondary | ICD-10-CM

## 2020-06-09 DIAGNOSIS — F329 Major depressive disorder, single episode, unspecified: Secondary | ICD-10-CM | POA: Diagnosis present

## 2020-06-09 DIAGNOSIS — N4821 Abscess of corpus cavernosum and penis: Secondary | ICD-10-CM

## 2020-06-09 DIAGNOSIS — E872 Acidosis: Secondary | ICD-10-CM | POA: Diagnosis present

## 2020-06-09 DIAGNOSIS — Z833 Family history of diabetes mellitus: Secondary | ICD-10-CM

## 2020-06-09 DIAGNOSIS — E875 Hyperkalemia: Secondary | ICD-10-CM | POA: Diagnosis present

## 2020-06-09 DIAGNOSIS — Z8611 Personal history of tuberculosis: Secondary | ICD-10-CM

## 2020-06-09 DIAGNOSIS — B9689 Other specified bacterial agents as the cause of diseases classified elsewhere: Secondary | ICD-10-CM | POA: Diagnosis present

## 2020-06-09 DIAGNOSIS — T83518A Infection and inflammatory reaction due to other urinary catheter, initial encounter: Principal | ICD-10-CM | POA: Diagnosis present

## 2020-06-09 DIAGNOSIS — Z9359 Other cystostomy status: Secondary | ICD-10-CM

## 2020-06-09 DIAGNOSIS — R339 Retention of urine, unspecified: Secondary | ICD-10-CM | POA: Diagnosis present

## 2020-06-09 DIAGNOSIS — T68XXXA Hypothermia, initial encounter: Secondary | ICD-10-CM

## 2020-06-09 DIAGNOSIS — K219 Gastro-esophageal reflux disease without esophagitis: Secondary | ICD-10-CM | POA: Diagnosis present

## 2020-06-09 DIAGNOSIS — R6520 Severe sepsis without septic shock: Secondary | ICD-10-CM | POA: Diagnosis present

## 2020-06-09 DIAGNOSIS — A419 Sepsis, unspecified organism: Secondary | ICD-10-CM

## 2020-06-09 DIAGNOSIS — E861 Hypovolemia: Secondary | ICD-10-CM | POA: Diagnosis present

## 2020-06-09 DIAGNOSIS — N319 Neuromuscular dysfunction of bladder, unspecified: Secondary | ICD-10-CM

## 2020-06-09 DIAGNOSIS — Z20822 Contact with and (suspected) exposure to covid-19: Secondary | ICD-10-CM | POA: Diagnosis present

## 2020-06-09 DIAGNOSIS — R531 Weakness: Secondary | ICD-10-CM

## 2020-06-09 DIAGNOSIS — N1832 Chronic kidney disease, stage 3b: Secondary | ICD-10-CM | POA: Diagnosis present

## 2020-06-09 DIAGNOSIS — E46 Unspecified protein-calorie malnutrition: Secondary | ICD-10-CM

## 2020-06-09 DIAGNOSIS — Z794 Long term (current) use of insulin: Secondary | ICD-10-CM

## 2020-06-09 DIAGNOSIS — B9620 Unspecified Escherichia coli [E. coli] as the cause of diseases classified elsewhere: Secondary | ICD-10-CM | POA: Diagnosis present

## 2020-06-09 DIAGNOSIS — D638 Anemia in other chronic diseases classified elsewhere: Secondary | ICD-10-CM | POA: Diagnosis present

## 2020-06-09 DIAGNOSIS — G9340 Encephalopathy, unspecified: Secondary | ICD-10-CM

## 2020-06-09 DIAGNOSIS — E1122 Type 2 diabetes mellitus with diabetic chronic kidney disease: Secondary | ICD-10-CM | POA: Diagnosis present

## 2020-06-09 DIAGNOSIS — E1143 Type 2 diabetes mellitus with diabetic autonomic (poly)neuropathy: Secondary | ICD-10-CM | POA: Diagnosis present

## 2020-06-09 DIAGNOSIS — E871 Hypo-osmolality and hyponatremia: Secondary | ICD-10-CM | POA: Diagnosis present

## 2020-06-09 DIAGNOSIS — Z87891 Personal history of nicotine dependence: Secondary | ICD-10-CM

## 2020-06-09 DIAGNOSIS — B961 Klebsiella pneumoniae [K. pneumoniae] as the cause of diseases classified elsewhere: Secondary | ICD-10-CM | POA: Diagnosis present

## 2020-06-09 LAB — GLUCOSE, CAPILLARY
Glucose-Capillary: 301 mg/dL — ABNORMAL HIGH (ref 70–99)
Glucose-Capillary: 383 mg/dL — ABNORMAL HIGH (ref 70–99)

## 2020-06-09 LAB — BASIC METABOLIC PANEL
Anion gap: 9 (ref 5–15)
Anion gap: 10 (ref 5–15)
BUN: 48 mg/dL — ABNORMAL HIGH (ref 6–20)
BUN: 38 mg/dL — ABNORMAL HIGH (ref 6–20)
CO2: 17 mmol/L — ABNORMAL LOW (ref 22–32)
CO2: 13 mmol/L — ABNORMAL LOW (ref 22–32)
Calcium: 8.5 mg/dL — ABNORMAL LOW (ref 8.9–10.3)
Calcium: 7.8 mg/dL — ABNORMAL LOW (ref 8.9–10.3)
Chloride: 103 mmol/L (ref 98–111)
Chloride: 105 mmol/L (ref 98–111)
Creatinine, Ser: 2.96 mg/dL — ABNORMAL HIGH (ref 0.61–1.24)
Creatinine, Ser: 2.32 mg/dL — ABNORMAL HIGH (ref 0.61–1.24)
GFR calc Af Amer: 28 mL/min — ABNORMAL LOW (ref >60)
GFR calc Af Amer: 38 mL/min — ABNORMAL LOW (ref >60)
GFR calc non Af Amer: 24 mL/min — ABNORMAL LOW (ref >60)
GFR calc non Af Amer: 33 mL/min — ABNORMAL LOW (ref >60)
Glucose, Bld: 340 mg/dL — ABNORMAL HIGH (ref 70–99)
Glucose, Bld: 273 mg/dL — ABNORMAL HIGH (ref 70–99)
Potassium: 4.1 mmol/L (ref 3.5–5.1)
Potassium: 4.1 mmol/L (ref 3.5–5.1)
Sodium: 129 mmol/L — ABNORMAL LOW (ref 135–145)
Sodium: 128 mmol/L — ABNORMAL LOW (ref 135–145)

## 2020-06-09 LAB — URINE DRUG SCREEN, QUALITATIVE (ARMC ONLY)
Amphetamines, Ur Screen: NOT DETECTED
Barbiturates, Ur Screen: NOT DETECTED
Benzodiazepine, Ur Scrn: NOT DETECTED
Cannabinoid 50 Ng, Ur ~~LOC~~: NOT DETECTED
Cocaine Metabolite,Ur ~~LOC~~: NOT DETECTED
MDMA (Ecstasy)Ur Screen: NOT DETECTED
Methadone Scn, Ur: NOT DETECTED
Opiate, Ur Screen: NOT DETECTED
Phencyclidine (PCP) Ur S: NOT DETECTED
Tricyclic, Ur Screen: NOT DETECTED

## 2020-06-09 LAB — URINALYSIS, COMPLETE (UACMP) WITH MICROSCOPIC
Bilirubin Urine: NEGATIVE
Glucose, UA: NEGATIVE mg/dL
Ketones, ur: NEGATIVE mg/dL
Nitrite: NEGATIVE
Protein, ur: 100 mg/dL — AB
RBC / HPF: >50 RBC/hpf — ABNORMAL HIGH (ref 0–5)
Specific Gravity, Urine: 1.011 (ref 1.005–1.030)
Squamous Epithelial / HPF: NONE SEEN (ref 0–5)
WBC, UA: >50 WBC/hpf — ABNORMAL HIGH (ref 0–5)
pH: 6 (ref 5.0–8.0)

## 2020-06-09 LAB — CBC
HCT: 28 % — ABNORMAL LOW (ref 39.0–52.0)
Hemoglobin: 9.8 g/dL — ABNORMAL LOW (ref 13.0–17.0)
MCH: 30.2 pg (ref 26.0–34.0)
MCHC: 35 g/dL (ref 30.0–36.0)
MCV: 86.4 fL (ref 80.0–100.0)
Platelets: 280 10*3/uL (ref 150–400)
RBC: 3.24 MIL/uL — ABNORMAL LOW (ref 4.22–5.81)
RDW: 15.9 % — ABNORMAL HIGH (ref 11.5–15.5)
WBC: 6.7 10*3/uL (ref 4.0–10.5)
nRBC: 0 % (ref 0.0–0.2)

## 2020-06-09 LAB — HEPATIC FUNCTION PANEL
ALT: 8 U/L (ref 0–44)
AST: 10 U/L — ABNORMAL LOW (ref 15–41)
Albumin: 3.3 g/dL — ABNORMAL LOW (ref 3.5–5.0)
Alkaline Phosphatase: 56 U/L (ref 38–126)
Bilirubin, Direct: <0.1 mg/dL (ref 0.0–0.2)
Total Bilirubin: 1.2 mg/dL (ref 0.3–1.2)
Total Protein: 7 g/dL (ref 6.5–8.1)

## 2020-06-09 LAB — PROTIME-INR
INR: 1.2 (ref 0.8–1.2)
Prothrombin Time: 14.6 seconds (ref 11.4–15.2)

## 2020-06-09 LAB — SARS CORONAVIRUS 2 BY RT PCR (HOSPITAL ORDER, PERFORMED IN ~~LOC~~ HOSPITAL LAB): SARS Coronavirus 2: NEGATIVE

## 2020-06-09 LAB — LACTIC ACID, PLASMA: Lactic Acid, Venous: 1.2 mmol/L (ref 0.5–1.9)

## 2020-06-09 LAB — TSH: TSH: 2.102 u[IU]/mL (ref 0.350–4.500)

## 2020-06-09 LAB — LIPASE, BLOOD: Lipase: 45 U/L (ref 11–51)

## 2020-06-09 LAB — BETA-HYDROXYBUTYRIC ACID: Beta-Hydroxybutyric Acid: 0.46 mmol/L — ABNORMAL HIGH (ref 0.05–0.27)

## 2020-06-09 LAB — AMMONIA: Ammonia: 12 umol/L (ref 9–35)

## 2020-06-09 LAB — APTT: aPTT: 33 seconds (ref 24–36)

## 2020-06-09 LAB — PROCALCITONIN: Procalcitonin: <0.1 ng/mL

## 2020-06-09 LAB — MAGNESIUM: Magnesium: 2.2 mg/dL (ref 1.7–2.4)

## 2020-06-09 IMAGING — CT CT HEAD W/O CM
3 series · 15 of 47 positions shown, 18 images · non-contrast
Comparison: No pertinent prior exams are available for comparison.

CLINICAL DATA: Mental status change, unknown cause. Additional
history provided: Patient reports increased weakness, loss of
appetite, nausea for approximately 1 week.

EXAM:
CT HEAD WITHOUT CONTRAST
TECHNIQUE: Contiguous axial images were obtained from the base of the skull
through the vertex without intravenous contrast.

[Series 2: head wo · axial · 0.41mm/px · z∈[+430,+560]mm · 9 of 32 slices shown, 12 images]
[im 3/32  brain]
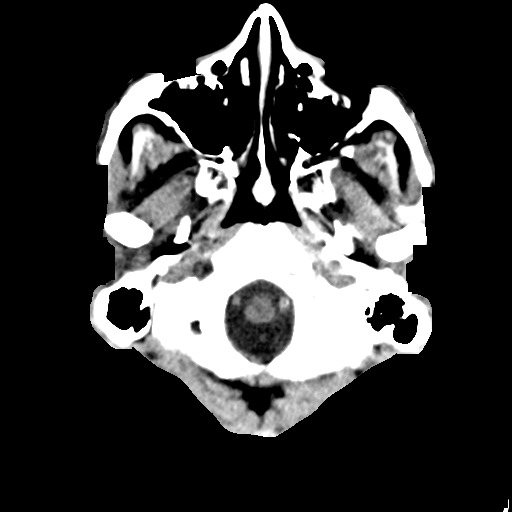
[im 3/32  bone]
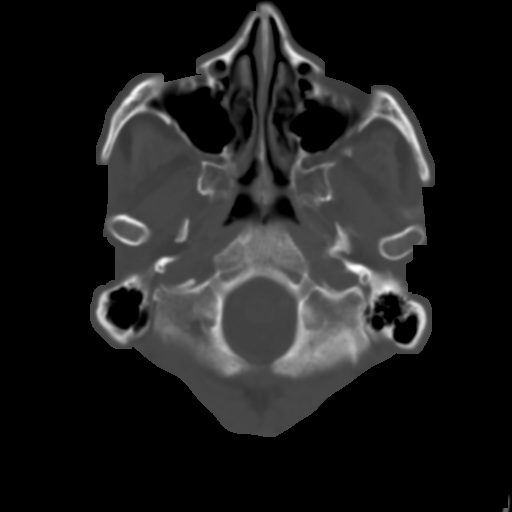
[im 6/32  brain]
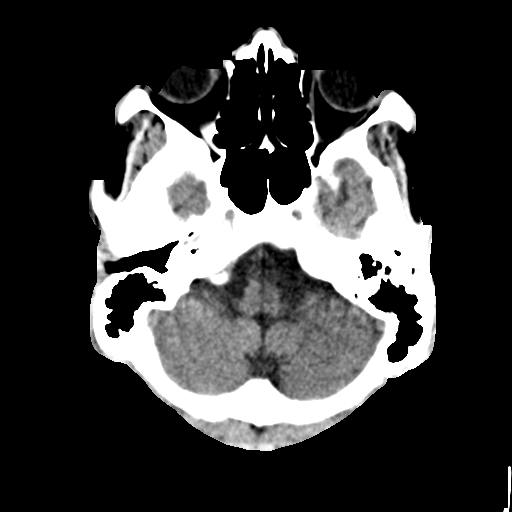
[im 9/32  brain]
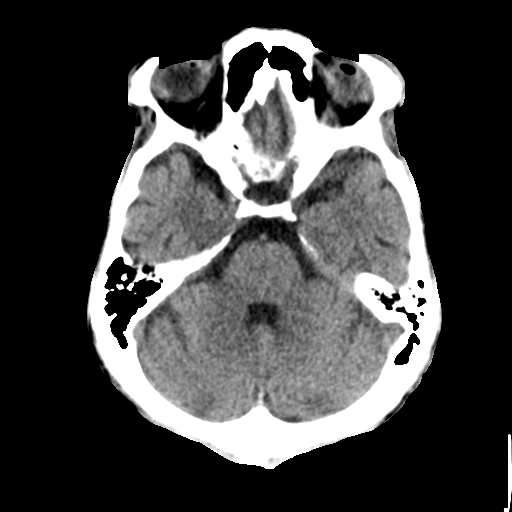
[im 12/32  brain]
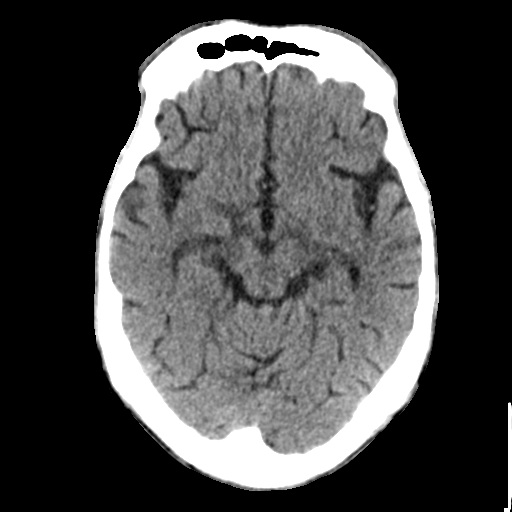
[im 17/32  brain]
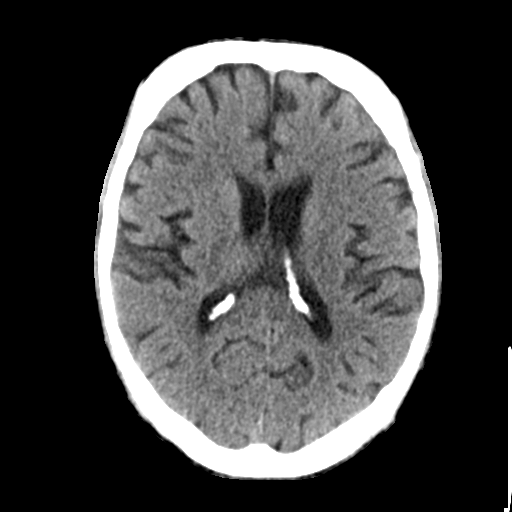
[im 17/32  bone]
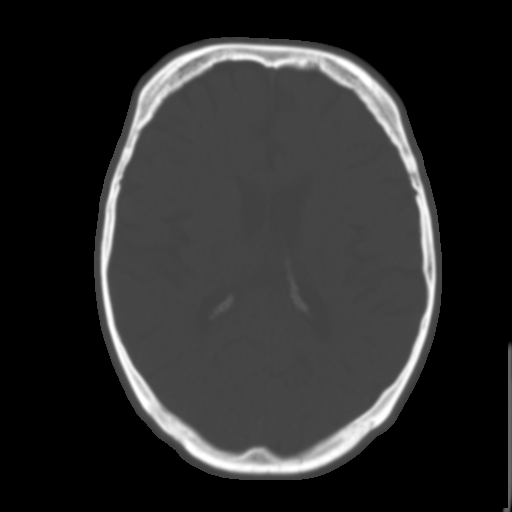
[im 20/32  brain]
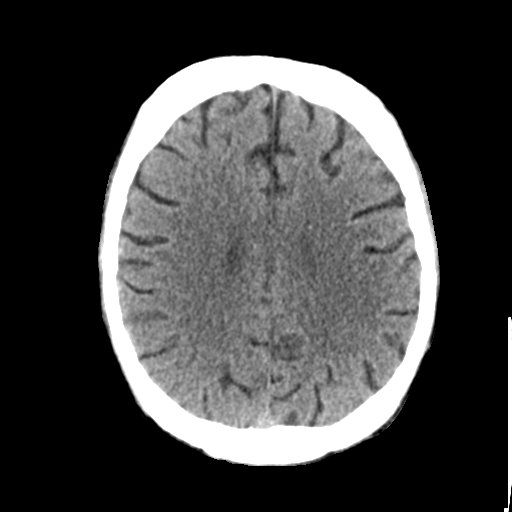
[im 23/32  brain]
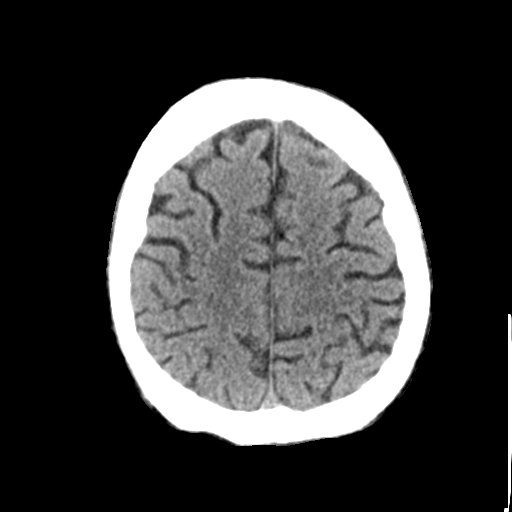
[im 26/32  brain]
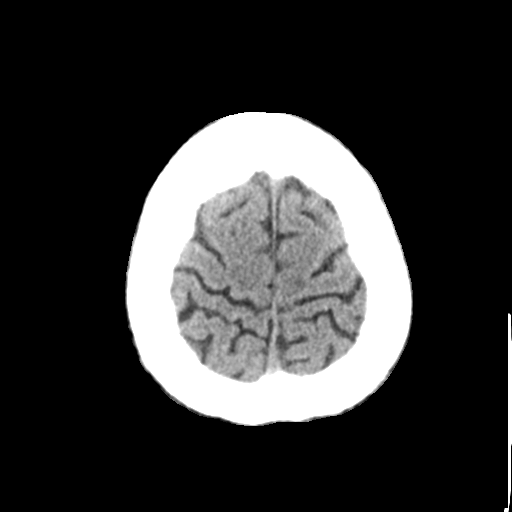
[im 29/32  brain]
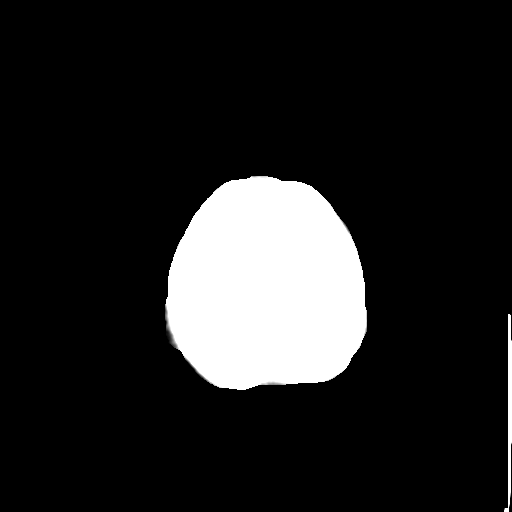
[im 29/32  bone]
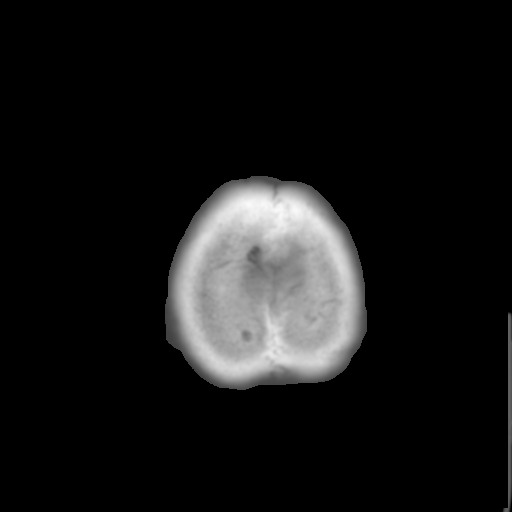

[Series 3: coronal soft tissue · coronal · 0.31mm/px · 3 of 68 slices shown]
[im 23/68  brain]
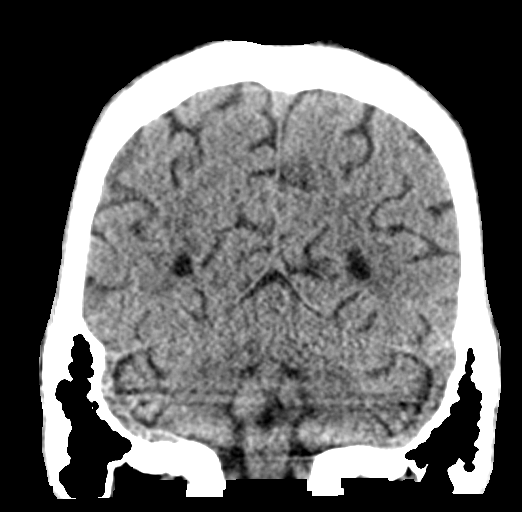
[im 30/68  brain]
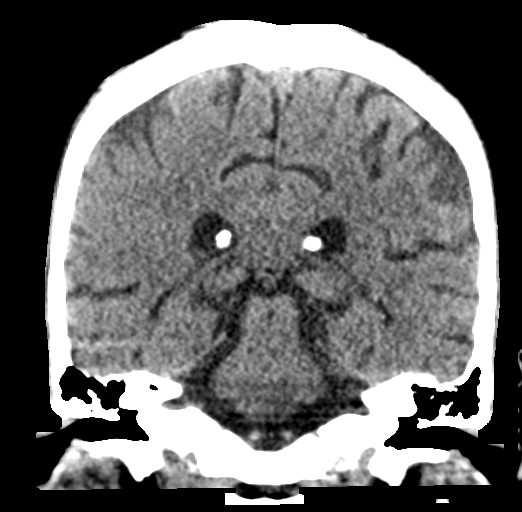
[im 38/68  brain]
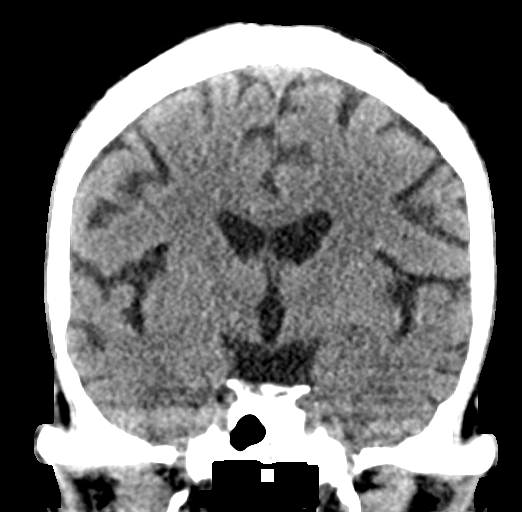

[Series 4: sagittal soft tissue · sagittal · 0.31mm/px · 3 of 54 slices shown]
[im 18/54  brain]
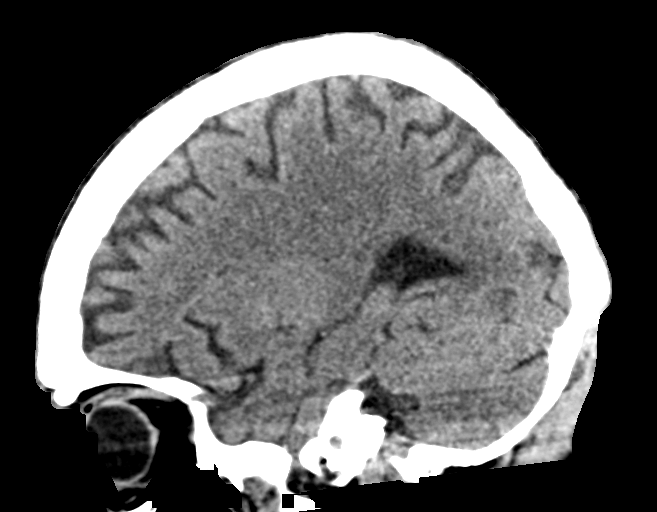
[im 27/54  brain]
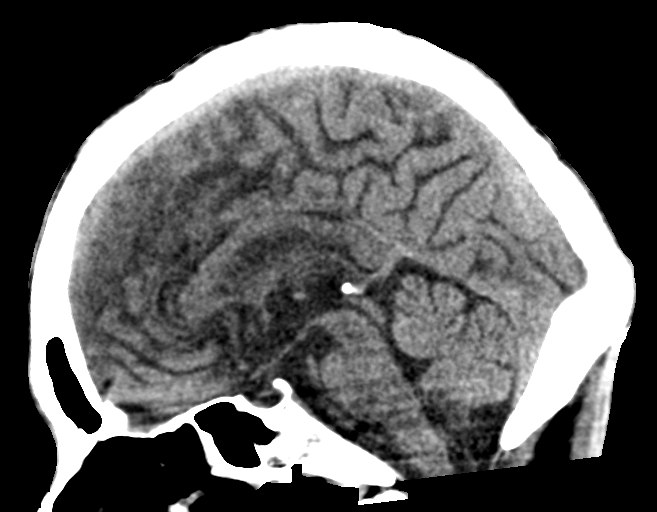
[im 36/54  brain]
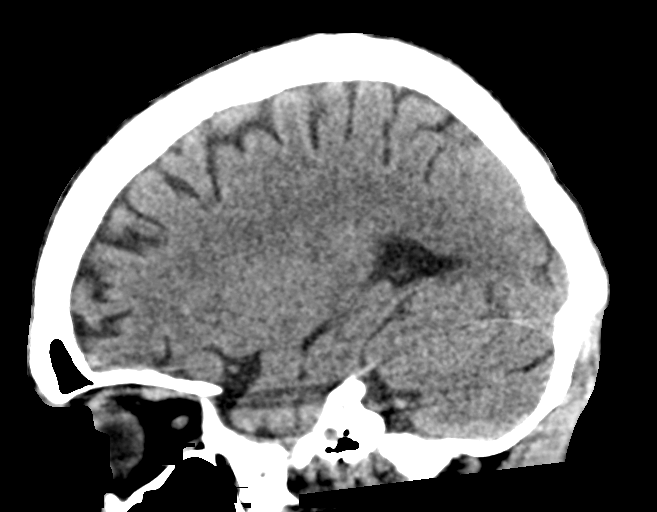

[15 of 47 positions shown; findings below may reference images not displayed]

FINDINGS: Brain:

Mild generalized parenchymal atrophy which is advanced for age.

Mild ill-defined hypoattenuation within cerebral white matter which
is nonspecific, but most commonly seen on the basis of chronic small
vessel ischemia.

There is no acute intracranial hemorrhage.

No demarcated cortical infarct.

No extra-axial fluid collection.

No evidence of intracranial mass.

No midline shift.

Vascular: No hyperdense vessel.

Skull: Normal. Negative for fracture or focal lesion.

Sinuses/Orbits: Visualized orbits show no acute finding. Trace right
maxillary sinus mucosal thickening at the imaged levels. No
significant mastoid effusion at the imaged levels.
IMPRESSION: No CT evidence of acute intracranial abnormality.

Mild generalized parenchymal atrophy which is advanced for age.

Mild cerebral white matter disease which is nonspecific, but most
commonly seen on the basis of chronic small vessel ischemia.

## 2020-06-09 IMAGING — DX DG CHEST 1V PORT
1 series · 2 of 2 positions shown · non-contrast
Comparison: Prior chest radiograph [DATE] and earlier, chest CT
[DATE].

:
Questionable sepsis, evaluate for abnormality.

EXAM:
PORTABLE CHEST 1 VIEW

[Series 1: chest ap · 0.14mm/px · 2 of 2 slices shown]
[im 1/2]
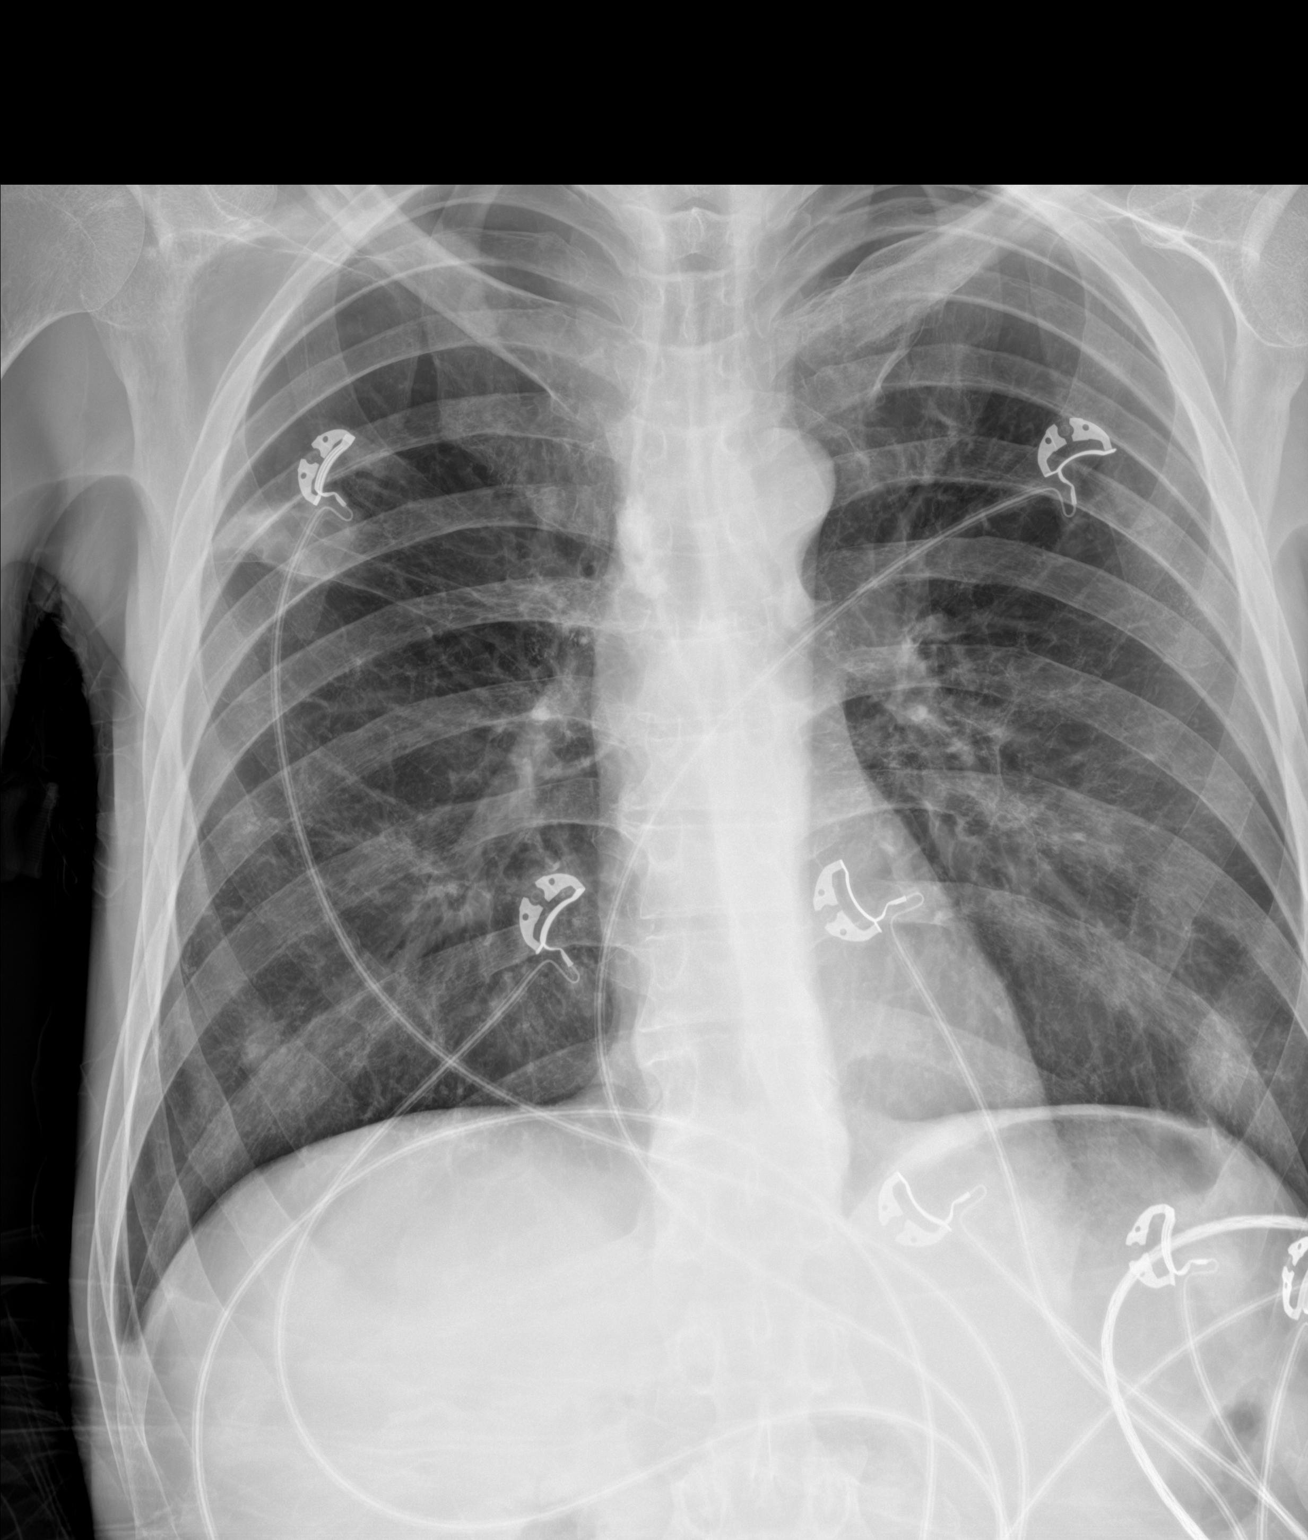
[im 2/2]
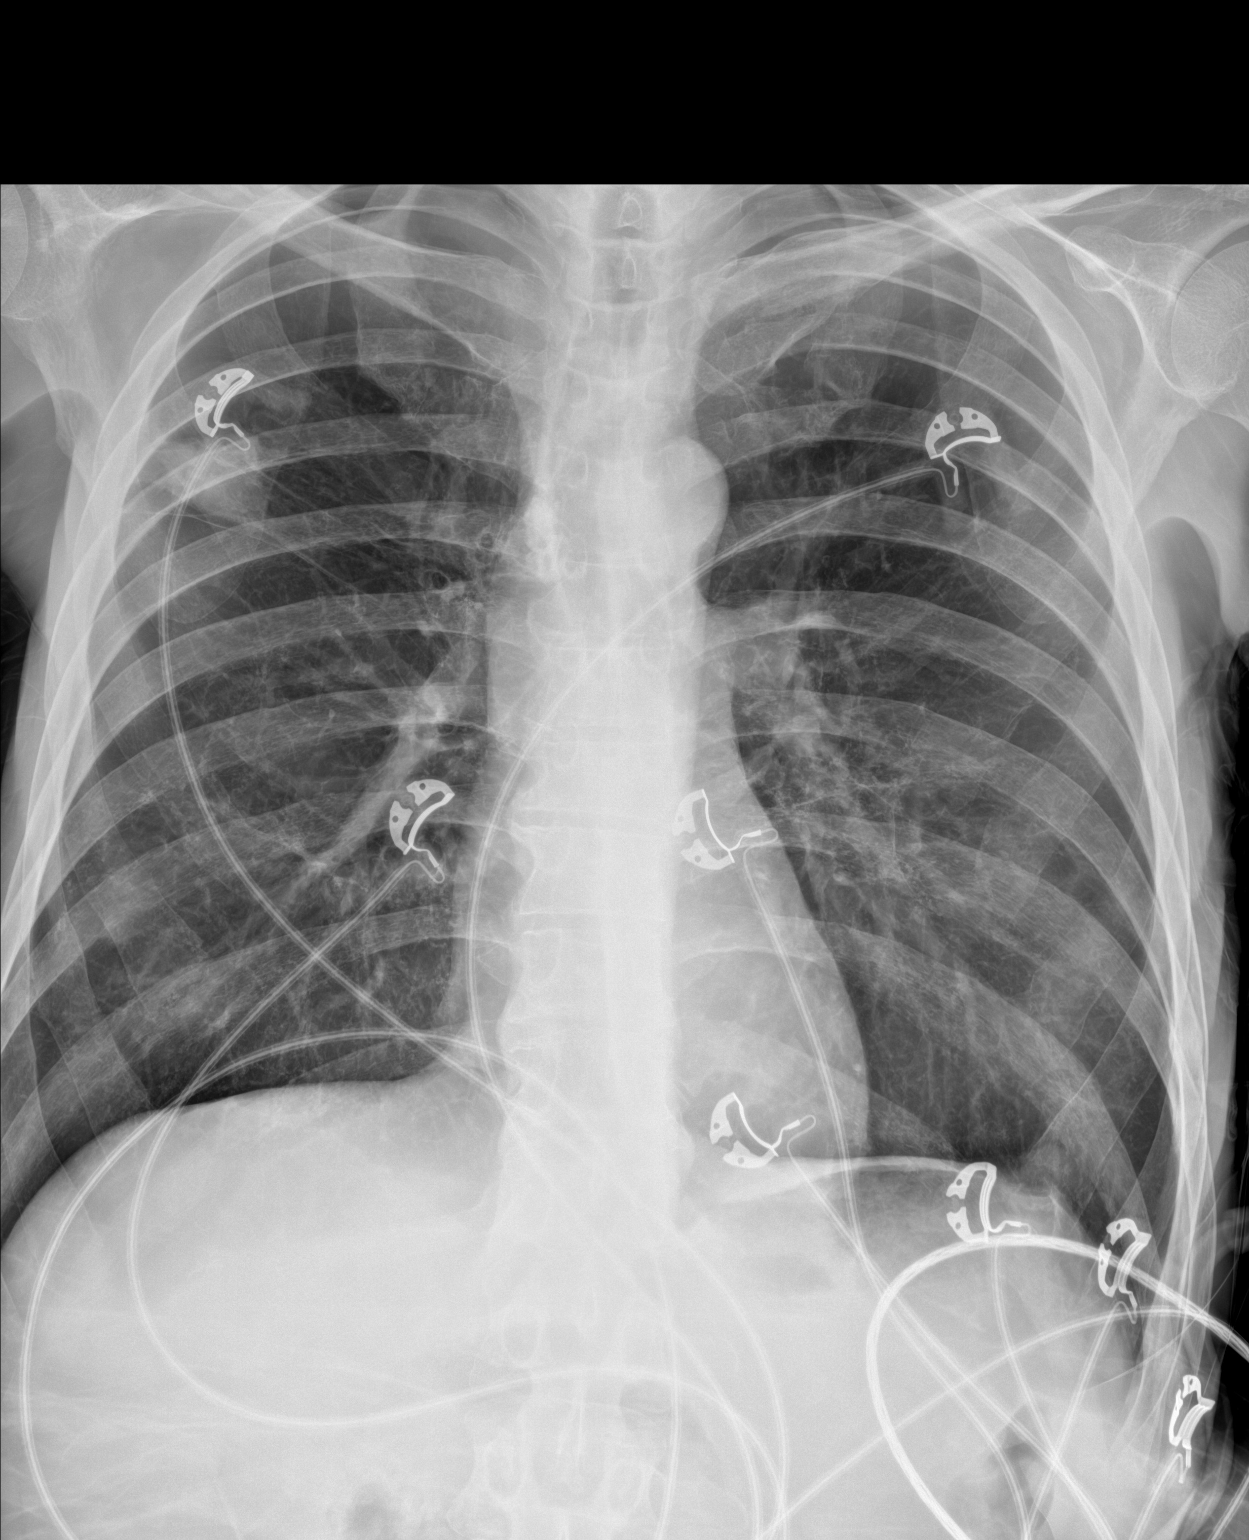

[2 of 2 positions shown; findings below may reference images not displayed]

FINDINGS: Heart size within normal limits. Unchanged ovoid nodular opacity
within the right upper lobe. No interval development of airspace
disease elsewhere. No evidence of pleural effusion or pneumothorax.
No acute bony abnormality identified.
IMPRESSION: Unchanged ovoid nodular opacity within the right upper lobe better
characterized on the prior chest CT of [DATE]. Please refer to
this prior examination for further description.

No airspace disease identified elsewhere within the chest.

## 2020-06-09 MED ORDER — INSULIN GLARGINE 100 UNIT/ML ~~LOC~~ SOLN
5.0000 [IU] | Freq: Every day | SUBCUTANEOUS | Status: DC
  Administered 2020-06-09: 5 [IU] via SUBCUTANEOUS
  Filled 2020-06-09 (×2): qty 0.05

## 2020-06-09 MED ORDER — VANCOMYCIN HCL IN DEXTROSE 1-5 GM/200ML-% IV SOLN
1000.0000 mg | Freq: Once | INTRAVENOUS | Status: AC
  Administered 2020-06-09: 1000 mg via INTRAVENOUS
  Filled 2020-06-09: qty 200

## 2020-06-09 MED ORDER — METRONIDAZOLE IN NACL 5-0.79 MG/ML-% IV SOLN
500.0000 mg | Freq: Once | INTRAVENOUS | Status: AC
  Administered 2020-06-09: 500 mg via INTRAVENOUS
  Filled 2020-06-09: qty 100

## 2020-06-09 MED ORDER — LACTATED RINGERS IV SOLN: INTRAVENOUS | Status: DC

## 2020-06-09 MED ORDER — ONDANSETRON HCL 4 MG PO TABS
4.0000 mg | ORAL_TABLET | Freq: Four times a day (QID) | ORAL | Status: DC | PRN
  Administered 2020-06-09 – 2020-06-11 (×3): 4 mg via ORAL
  Filled 2020-06-09 (×3): qty 1

## 2020-06-09 MED ORDER — POTASSIUM CHLORIDE 10 MEQ/100ML IV SOLN
10.0000 meq | INTRAVENOUS | Status: AC
  Administered 2020-06-09: 10 meq via INTRAVENOUS
  Filled 2020-06-09: qty 100

## 2020-06-09 MED ORDER — ONDANSETRON HCL 4 MG/2ML IJ SOLN: 4.0000 mg | Freq: Four times a day (QID) | INTRAMUSCULAR | Status: DC | PRN

## 2020-06-09 MED ORDER — ACETAMINOPHEN 650 MG RE SUPP: 650.0000 mg | Freq: Four times a day (QID) | RECTAL | Status: DC | PRN

## 2020-06-09 MED ORDER — CALCIUM GLUCONATE-NACL 1-0.675 GM/50ML-% IV SOLN: 1.0000 g | Freq: Once | INTRAVENOUS | Status: DC

## 2020-06-09 MED ORDER — ACETAMINOPHEN 325 MG PO TABS: 650.0000 mg | ORAL_TABLET | Freq: Four times a day (QID) | ORAL | Status: DC | PRN

## 2020-06-09 MED ORDER — SODIUM CHLORIDE 0.9 % IV BOLUS: 1000.0000 mL | Freq: Once | INTRAVENOUS | Status: DC

## 2020-06-09 MED ORDER — ENOXAPARIN SODIUM 30 MG/0.3ML ~~LOC~~ SOLN
30.0000 mg | SUBCUTANEOUS | Status: DC
  Administered 2020-06-09 – 2020-06-17 (×9): 30 mg via SUBCUTANEOUS
  Filled 2020-06-09 (×10): qty 0.3

## 2020-06-09 MED ORDER — DULOXETINE HCL 30 MG PO CPEP
30.0000 mg | ORAL_CAPSULE | Freq: Every day | ORAL | Status: DC
  Administered 2020-06-09 – 2020-06-19 (×11): 30 mg via ORAL
  Filled 2020-06-09 (×13): qty 1

## 2020-06-09 MED ORDER — INSULIN ASPART 100 UNIT/ML ~~LOC~~ SOLN
0.0000 [IU] | Freq: Three times a day (TID) | SUBCUTANEOUS | Status: DC
  Administered 2020-06-09: 9 [IU] via SUBCUTANEOUS
  Administered 2020-06-10: 3 [IU] via SUBCUTANEOUS
  Administered 2020-06-10 – 2020-06-11 (×2): 2 [IU] via SUBCUTANEOUS
  Administered 2020-06-11: 1 [IU] via SUBCUTANEOUS
  Administered 2020-06-11: 3 [IU] via SUBCUTANEOUS
  Administered 2020-06-12: 2 [IU] via SUBCUTANEOUS
  Administered 2020-06-12: 1 [IU] via SUBCUTANEOUS
  Administered 2020-06-13 (×3): 2 [IU] via SUBCUTANEOUS
  Administered 2020-06-14 (×2): 3 [IU] via SUBCUTANEOUS
  Filled 2020-06-09 (×13): qty 1

## 2020-06-09 MED ORDER — DEXTROSE IN LACTATED RINGERS 5 % IV SOLN: INTRAVENOUS | Status: DC

## 2020-06-09 MED ORDER — DEXTROSE 50 % IV SOLN: 0.0000 mL | INTRAVENOUS | Status: DC | PRN

## 2020-06-09 MED ORDER — CALCIUM CARBONATE ANTACID 500 MG PO CHEW
1.0000 | CHEWABLE_TABLET | Freq: Three times a day (TID) | ORAL | Status: DC
  Administered 2020-06-09 – 2020-06-19 (×30): 200 mg via ORAL
  Filled 2020-06-09 (×30): qty 1

## 2020-06-09 MED ORDER — LACTATED RINGERS IV BOLUS (SEPSIS)
1000.0000 mL | Freq: Once | INTRAVENOUS | Status: AC
  Administered 2020-06-09: 1000 mL via INTRAVENOUS

## 2020-06-09 MED ORDER — SODIUM CHLORIDE 0.9% FLUSH
3.0000 mL | Freq: Two times a day (BID) | INTRAVENOUS | Status: DC
  Administered 2020-06-10 – 2020-06-19 (×15): 3 mL via INTRAVENOUS

## 2020-06-09 MED ORDER — SODIUM CHLORIDE 0.9 % IV SOLN
2.0000 g | Freq: Once | INTRAVENOUS | Status: AC
  Administered 2020-06-09: 2 g via INTRAVENOUS
  Filled 2020-06-09: qty 2

## 2020-06-09 MED ORDER — SODIUM BICARBONATE 8.4 % IV SOLN
INTRAVENOUS | Status: DC
  Filled 2020-06-09 (×2): qty 850

## 2020-06-09 MED ORDER — LACTATED RINGERS IV BOLUS (SEPSIS)
500.0000 mL | Freq: Once | INTRAVENOUS | Status: AC
  Administered 2020-06-09: 500 mL via INTRAVENOUS

## 2020-06-09 MED ORDER — ENOXAPARIN SODIUM 40 MG/0.4ML ~~LOC~~ SOLN: 40.0000 mg | SUBCUTANEOUS | Status: DC

## 2020-06-09 MED ORDER — INSULIN REGULAR(HUMAN) IN NACL 100-0.9 UT/100ML-% IV SOLN: INTRAVENOUS | Status: DC

## 2020-06-09 NOTE — ED Notes (Signed)
Pt presentation discussed with First Nurse; pt taken to RM 14 at this time.  EDP, Dayjah Selman at bedside.

## 2020-06-09 NOTE — Consult Note (Signed)
PHARMACY -  BRIEF ANTIBIOTIC NOTE   Pharmacy has received consult(s) for cefepime and vancomycin from an ED provider. Patient is also ordered metronidazole. The patient's profile has been reviewed for ht/wt/allergies/indication/available labs.    One time order(s) placed for  --Vancomycin 1 g (already ordered) --Cefepime 2 g (already ordered)  Further antibiotics/pharmacy consults should be ordered by admitting physician if indicated.                       Thank you, Benita Gutter 06/09/2020  1:25 PM

## 2020-06-09 NOTE — ED Triage Notes (Signed)
Pt in via POV, reports increased weakness, loss of appetite, nausea x approximately one week.  Pt with chronic urinary catheter.  Pt appears malnourished.  NAD noted at this time.

## 2020-06-09 NOTE — H&P (Addendum)
History and Physical    Benjamyn Hestand TJQ:[id number] DOB: September 19, 1976 DOA: 06/09/2020  PCP: [facility]  Patient coming from: home    Chief Complaint: weakness, nausea  HPI: 44 y/o M w/ PMH of DM2, chronic urinary retention s/p suprapubic catheter, possible depression who presents w/ weakness & nausea x 1 week. Pt is a poor historian. Pt c/o difficulty walking & no appetite for 1 week. Pt does use a walker. Pt denies any sick contacts. Pt denies getting COVID19 vaccine. Also, pt c/o diarrhea daily x 2-3 years. Pt has approx 2-3 episodes of diarrhea daily. Pt denies any bright red blood or black stools. Of note, pt is allergic to milk. Pt denies any fevers, chills, sweating, cough, chest pain, shortness of breath, abd pain, or constipation   Review of Systems: As per HPI otherwise 10 point review of systems negative.    Past Medical History:  Diagnosis Date  . COVID-19   . Diabetes mellitus without complication ([place])   . Gastroparesis   . Tuberculosis     Past Surgical History:  Procedure Laterality Date  . COLONOSCOPY N/A 01/25/2020   Procedure: COLONOSCOPY;  Surgeon: Toledo, Benay Pike, MD;  Location: [facility] ENDOSCOPY;  Service: Gastroenterology;  Laterality: N/A;  . ESOPHAGOGASTRODUODENOSCOPY N/A 02/03/2019   Procedure: ESOPHAGOGASTRODUODENOSCOPY (EGD);  Surgeon: Toledo, Benay Pike, MD;  Location: [facility] ENDOSCOPY;  Service: Gastroenterology;  Laterality: N/A;     reports that he has quit smoking. His smoking use included cigars and cigarettes. He has never used smokeless tobacco. He reports that he does not drink alcohol and does not use drugs.  No Known Allergies  Family History  Problem Relation Age of Onset  . Diabetes Mother   . Diabetes Father     Prior to Admission medications   Medication Sig Start Date End Date Taking? Authorizing Provider  aspirin EC 81 MG tablet Take 81 mg by mouth daily.   Yes [provider]  benztropine (COGENTIN)  0.5 MG tablet Take 1 tablet (0.5 mg total) by mouth 2 (two) times daily. 04/09/20  Yes Pokhrel, Laxman, MD  DULoxetine (CYMBALTA) 30 MG capsule Take 1 capsule (30 mg total) by mouth daily. 04/10/20  Yes Pokhrel, Laxman, MD  ferrous sulfate 325 (65 FE) MG EC tablet Take 1 tablet (325 mg total) by mouth 2 (two) times daily. 05/26/20 09/23/20 Yes [name], MD  haloperidol (HALDOL) 0.5 MG tablet Take 1 tablet (0.5 mg total) by mouth 2 (two) times daily. 04/09/20  Yes Pokhrel, Laxman, MD  insulin glargine (LANTUS) 100 UNIT/ML injection Inject 0.08 mLs (8 Units total) into the skin daily. 05/13/20  Yes Fritzi Mandes, MD  loperamide (IMODIUM) 2 MG capsule Take 1 capsule (2 mg total) by mouth 2 (two) times daily as needed for diarrhea or loose stools. 02/09/20  Yes Wieting, Richard, MD  Multiple Vitamin (MULTIVITAMIN WITH MINERALS) TABS tablet Take 1 tablet by mouth daily. 05/13/20  Yes Fritzi Mandes, MD  NEXIUM 40 MG capsule Take 40 mg by mouth daily. 04/21/20  Yes [provider]  sodium bicarbonate 650 MG tablet Take 2 tablets (1,300 mg total) by mouth 2 (two) times daily. 04/09/20  Yes Pokhrel, Corrie Mckusick, MD  colesevelam (WELCHOL) 625 MG tablet Take 3 tablets (1,875 mg total) by mouth 2 (two) times daily with a meal. Patient not taking: Reported on 06/09/2020 02/09/20   Loletha Grayer, MD  docusate sodium (COLACE) 100 MG capsule Take 1 capsule (100 mg total) by mouth 2 (two) times daily as  needed for mild constipation. Patient not taking: Reported on 06/09/2020 03/28/20   Lorella Nimrod, MD  feeding supplement, GLUCERNA SHAKE, (GLUCERNA SHAKE) LIQD Take 237 mLs by mouth 3 (three) times daily between meals. 05/13/20   Fritzi Mandes, MD  pantoprazole (PROTONIX) 40 MG tablet Take 1 tablet (40 mg total) by mouth daily. Patient not taking: Reported on 06/09/2020 04/03/20   Gonzella Lex, MD    Physical Exam: Vitals:   06/09/20 1311 06/09/20 1346 06/09/20 1430 06/09/20 1500  BP: 108/85  (!) 134/102 (!) 140/94  Pulse:    63 (!) 59  Resp:   13 11  Temp:  (!) 93.9 F (34.4 C)    TempSrc:  Rectal    SpO2:   100% 100%  Weight:      Height:        Constitutional: NAD, calm, comfortable. Appears older than stated age. Malnourished Vitals:   06/09/20 1311 06/09/20 1346 06/09/20 1430 06/09/20 1500  BP: 108/85  (!) 134/102 (!) 140/94  Pulse:   63 (!) 59  Resp:   13 11  Temp:  (!) 93.9 F (34.4 C)    TempSrc:  Rectal    SpO2:   100% 100%  Weight:      Height:       Eyes: PERRL, lids and conjunctivae normal ENMT: Mucous membranes are moist.  Neck: normal, supple Respiratory: clear to auscultation bilaterally, no wheezing, no crackles. Normal respiratory effort.  Cardiovascular: S1/S2+. no rubs / gallops. No extremity edema.  Abdomen: soft, no tenderness, non-distended, hyperactive bowel sounds  Musculoskeletal: no clubbing / cyanosis. Decreased muscle tone.  Skin: no rashes, lesions, ulcers. No induration Neurologic: CN 2-12 grossly intact. Moves all 4 extremities Psychiatric: Normal judgment and insight. Alert and oriented x 3. Flat mood and affect.    Labs on Admission: I have personally reviewed following labs and imaging studies  CBC: Recent Labs  Lab 06/09/20 1301  WBC 6.7  HGB 9.8*  HCT 28.0*  MCV 86.4  PLT 258   Basic Metabolic Panel: Recent Labs  Lab 06/09/20 1301 06/09/20 1320  NA 129* 128*  K 4.1 4.1  CL 103 105  CO2 17* 13*  GLUCOSE 340* 273*  BUN 48* 38*  CREATININE 2.96* 2.32*  CALCIUM 8.5* 7.8*  MG  --  2.2   GFR: Estimated Creatinine Clearance: 25.8 mL/min (A) (by C-G formula based on SCr of 2.32 mg/dL (H)). Liver Function Tests: Recent Labs  Lab 06/09/20 1301  AST 10*  ALT 8  ALKPHOS 56  BILITOT 1.2  PROT 7.0  ALBUMIN 3.3*   Recent Labs  Lab 06/09/20 1301  LIPASE 45   Recent Labs  Lab 06/09/20 1320  AMMONIA 12   Coagulation Profile: Recent Labs  Lab 06/09/20 1320  INR 1.2   Cardiac Enzymes: No results for input(s): CKTOTAL, CKMB,  CKMBINDEX, TROPONINI in the last 168 hours. BNP (last 3 results) No results for input(s): PROBNP in the last 8760 hours. HbA1C: No results for input(s): HGBA1C in the last 72 hours. CBG: No results for input(s): GLUCAP in the last 168 hours. Lipid Profile: No results for input(s): CHOL, HDL, LDLCALC, TRIG, CHOLHDL, LDLDIRECT in the last 72 hours. Thyroid Function Tests: Recent Labs    06/09/20 1320  TSH 2.102   Anemia Panel: No results for input(s): VITAMINB12, FOLATE, FERRITIN, TIBC, IRON, RETICCTPCT in the last 72 hours. Urine analysis:    Component Value Date/Time   COLORURINE YELLOW (A) 06/09/2020 1320   APPEARANCEUR TURBID (  A) 06/09/2020 1320   LABSPEC 1.011 06/09/2020 1320   PHURINE 6.0 06/09/2020 1320   GLUCOSEU NEGATIVE 06/09/2020 1320   HGBUR LARGE (A) 06/09/2020 1320   BILIRUBINUR NEGATIVE 06/09/2020 1320   KETONESUR NEGATIVE 06/09/2020 1320   PROTEINUR 100 (A) 06/09/2020 1320   NITRITE NEGATIVE 06/09/2020 1320   LEUKOCYTESUR LARGE (A) 06/09/2020 1320    Radiological Exams on Admission: CT ABDOMEN PELVIS WO CONTRAST  Result Date: 06/09/2020 CLINICAL DATA:  Abdominal pain, nausea EXAM: CT ABDOMEN AND PELVIS WITHOUT CONTRAST TECHNIQUE: Multidetector CT imaging of the abdomen and pelvis was performed following the standard protocol without IV contrast. COMPARISON:  01/21/2020 FINDINGS: Lower chest: No acute abnormality. Hepatobiliary: No focal liver lesion identified on noncontrast exam. Gallbladder appears grossly unremarkable. No hyperdense gallstone. No biliary dilatation. Pancreas: Grossly unremarkable. Spleen: Normal in size without focal abnormality. Adrenals/Urinary Tract: Unremarkable adrenal glands. Kidneys have an unremarkable noncontrast appearance. Tiny 1 mm calcification within the inferior pole of the left kidney. No hydronephrosis. Suprapubic catheter terminates within the urinary bladder lumen. Bladder is thick-walled. Small amount of air present within the  bladder lumen, likely related to catheterization. Stomach/Bowel: Evaluation of the bowel is somewhat limited secondary to lack of intra-abdominal fat/cachexia. No dilated loops of bowel to suggest obstruction. No definite area of focal bowel wall thickening or inflammatory changes. Vascular/Lymphatic: No acute vascular findings on noncontrast study. No abdominopelvic lymphadenopathy is evident. Reproductive: Prostate is unremarkable. Other: No ascites.  No pneumoperitoneum.  No abdominal wall hernia. Musculoskeletal: Remote healed fractures of the left hemipelvis and proximal left femur. No new or acute osseous findings. IMPRESSION: 1. Suprapubic urinary bladder catheter in place. Bladder is thick-walled. Correlate with urinalysis to exclude cystitis. 2. Tiny 1 mm nonobstructing left renal calculus. Electronically Signed   By: Davina Poke D.O.   On: 06/09/2020 14:07   CT Head Wo Contrast  Result Date: 06/09/2020 CLINICAL DATA:  Mental status change, unknown cause. Additional history provided: Patient reports increased weakness, loss of appetite, nausea for approximately 1 week. EXAM: CT HEAD WITHOUT CONTRAST TECHNIQUE: Contiguous axial images were obtained from the base of the skull through the vertex without intravenous contrast. COMPARISON:  No pertinent prior exams are available for comparison. FINDINGS: Brain: Mild generalized parenchymal atrophy which is advanced for age. Mild ill-defined hypoattenuation within cerebral white matter which is nonspecific, but most commonly seen on the basis of chronic small vessel ischemia. There is no acute intracranial hemorrhage. No demarcated cortical infarct. No extra-axial fluid collection. No evidence of intracranial mass. No midline shift. Vascular: No hyperdense vessel. Skull: Normal. Negative for fracture or focal lesion. Sinuses/Orbits: Visualized orbits show no acute finding. Trace right maxillary sinus mucosal thickening at the imaged levels. No significant  mastoid effusion at the imaged levels. IMPRESSION: No CT evidence of acute intracranial abnormality. Mild generalized parenchymal atrophy which is advanced for age. Mild cerebral white matter disease which is nonspecific, but most commonly seen on the basis of chronic small vessel ischemia. Electronically Signed   By: Kellie Simmering DO   On: 06/09/2020 15:41   DG Chest Port 1 View  Result Date: 06/09/2020 : Questionable sepsis, evaluate for abnormality. EXAM: PORTABLE CHEST 1 VIEW COMPARISON:  Prior chest radiograph 05/02/2020 and earlier, chest CT 12/29/2019. FINDINGS: Heart size within normal limits. Unchanged ovoid nodular opacity within the right upper lobe. No interval development of airspace disease elsewhere. No evidence of pleural effusion or pneumothorax. No acute bony abnormality identified. IMPRESSION: Unchanged ovoid nodular opacity within the right upper lobe  better characterized on the prior chest CT of 12/29/2019. Please refer to this prior examination for further description. No airspace disease identified elsewhere within the chest. Electronically Signed   By: Kellie Simmering DO   On: 06/09/2020 13:37    EKG: Independently reviewed.   Assessment/Plan Active Problems:   Encephalopathy Possible sepsis: presented w/ hypothermia & hypotension that resolved w/ IVFs. CXR shows no acute findings. UA appears dirty but has chronic suprapubic catheter, so will f/u urine & blood cxs. Normal WBC, procal <0.10. Urine drug screen & ethanol level ordered. CT head shows no acute intracranial findings but atrophy present but advanced for age. Will hold off on abxs currently and continue to monitor   Generalized weakness: etiology unclear. PT/OT consulted   Metabolic acidosis: started bicarb drip. No anion gap. Lactic acid WNL. Will continue to monitor   DM2: poorly controlled. Started on lantus, SSI w/ accuchecks. 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  Continue on IVFs  Chronic urinary retention: s/p suprapubic catheter, last changed 3 days ago.   Malnutrition: likely severe protein-calorie. Will consult nutrition  Diarrhea: chronic x 2-3 years. Stool culture ordered.   Hyponatremia: continue on IVFs. Will continue to monitor  Hypocalcemia: calcium gluconate x1. Will continue to monitor   Possibly ACD: no need for a transfusion at this time. Will continue to monitor    DVT prophylaxis: lovenox  Code Status: full  Family Communication:  Disposition Plan: likely back home Consults called: none Admission status: observation    Wyvonnia Dusky MD [facility] Pager [phone]   If 7PM-7AM, please contact night-coverage [email or web address]   06/09/2020, 3:56 PM

## 2020-06-09 NOTE — ED Provider Notes (Signed)
[facility] Emergency Department Provider Note  ____________________________________________   First MD Initiated Contact with Patient 06/09/20 1308     (approximate)  I have reviewed the triage vital signs and the nursing notes.   HISTORY  Chief Complaint Weakness   HPI Gregory Moody is a 44 y.o. male with possible history of diabetes, gastroparesis, urinary retention with suprapubic catheter, CKD, chronic anemia, and chronic diarrhea who presents to the ED reportedly from home after being dropped off by a friend chief complaint of generalized weakness and some nausea and vomiting yesterday.  History is very limited from the patient secondary to altered mental status as he is not currently oriented to year, date, or recent history.   Past Medical History:  Diagnosis Date  . COVID-19   . Diabetes mellitus without complication ([place])   . Gastroparesis   . Tuberculosis     Patient Active Problem List   Diagnosis Date Noted  . Encephalopathy 06/09/2020  . Neurogenic bladder   . Penile abscess 05/06/2020  . FTT (failure to thrive) in adult 05/03/2020  . Weakness 05/02/2020  . CKD (chronic kidney disease), stage IV ([place]) 05/02/2020  . Type 1 diabetes mellitus with diabetic chronic kidney disease ([place])   . Goals of care, counseling/discussion   . Palliative care by specialist   . Recurrent major depression-severe ([place]) 03/31/2020  . Hyperkalemia   . Iron deficiency anemia   . Weight loss   . Chronic diarrhea 02/07/2020  . Abnormal CT of the chest   . Acute urinary retention 02/06/2020  . Hyperglycemia due to type 2 diabetes mellitus ([place]) 02/06/2020  . History of Clostridium difficile colitis 02/06/2020  . Generalized weakness 02/06/2020  . History of COVID-19 02/06/2020  . Diarrhea 01/21/2020  . Tobacco abuse 01/21/2020  . C. difficile colitis 01/21/2020  . Urinary retention 01/21/2020  . Diabetes mellitus without complication ([place])  [id number]  . Protein-calorie malnutrition, severe 01/02/2020  . Odynophagia   . DKA (diabetic ketoacidoses) ([place]) 12/27/2019  . Depression 12/27/2019  . DKA, type 2 ([place]) 12/27/2019  . Anemia of chronic disease 04/25/2019  . Hypomagnesemia 04/25/2019  . Hypotension 04/23/2019  . CAP (community acquired pneumonia) due to MSSA (methicillin sensitive Staphylococcus aureus) ([place]) 04/14/2019  . COVID-19 virus infection 04/14/2019  . Pressure injury of skin 03/31/2019  . Acute renal failure (ARF) ([place]) 03/06/2019  . CAP (community acquired pneumonia) 02/14/2019  . AKI (acute kidney injury) ([place]) 02/02/2019  . ARF (acute renal failure) ([place]) 01/03/2019  . Malnutrition of moderate degree 02/24/2018  . GERD (gastroesophageal reflux disease) 02/23/2018  . Diabetic gastroparesis ([place]) 02/23/2018  . Diabetic foot ulcer ([place]) 02/23/2018  . Aspiration pneumonia ([place]) 04/21/2017  . Hypoglycemia 04/21/2017  . Diabetes mellitus with hyperglycemia ([place]) 04/21/2017  . Hip fracture, unspecified laterality, closed, initial encounter ([place]) 04/17/2017  . Hyperglycemia 04/17/2017  . Malnutrition ([place]) 04/09/2016  . Cavitary lesion of lung 04/06/2016    Past Surgical History:  Procedure Laterality Date  . COLONOSCOPY N/A 01/25/2020   Procedure: COLONOSCOPY;  Surgeon: Toledo, Benay Pike, MD;  Location: [facility] ENDOSCOPY;  Service: Gastroenterology;  Laterality: N/A;  . ESOPHAGOGASTRODUODENOSCOPY N/A 02/03/2019   Procedure: ESOPHAGOGASTRODUODENOSCOPY (EGD);  Surgeon: Toledo, Benay Pike, MD;  Location: [facility] ENDOSCOPY;  Service: Gastroenterology;  Laterality: N/A;    Prior to Admission medications   Medication Sig Start Date End Date Taking? Authorizing Provider  aspirin EC 81 MG tablet Take 81 mg by mouth daily.   Yes [provider]  benztropine (  COGENTIN) 0.5 MG tablet Take 1 tablet (0.5 mg total) by mouth 2 (two) times daily. 04/09/20  Yes Pokhrel, Laxman, MD  DULoxetine (CYMBALTA) 30 MG capsule  Take 1 capsule (30 mg total) by mouth daily. 04/10/20  Yes Pokhrel, Laxman, MD  ferrous sulfate 325 (65 FE) MG EC tablet Take 1 tablet (325 mg total) by mouth 2 (two) times daily. 05/26/20 09/23/20 Yes [name], MD  haloperidol (HALDOL) 0.5 MG tablet Take 1 tablet (0.5 mg total) by mouth 2 (two) times daily. 04/09/20  Yes Pokhrel, Laxman, MD  insulin glargine (LANTUS) 100 UNIT/ML injection Inject 0.08 mLs (8 Units total) into the skin daily. 05/13/20  Yes Fritzi Mandes, MD  loperamide (IMODIUM) 2 MG capsule Take 1 capsule (2 mg total) by mouth 2 (two) times daily as needed for diarrhea or loose stools. 02/09/20  Yes Wieting, Richard, MD  Multiple Vitamin (MULTIVITAMIN WITH MINERALS) TABS tablet Take 1 tablet by mouth daily. 05/13/20  Yes Fritzi Mandes, MD  NEXIUM 40 MG capsule Take 40 mg by mouth daily. 04/21/20  Yes [provider]  sodium bicarbonate 650 MG tablet Take 2 tablets (1,300 mg total) by mouth 2 (two) times daily. 04/09/20  Yes Pokhrel, Corrie Mckusick, MD  colesevelam (WELCHOL) 625 MG tablet Take 3 tablets (1,875 mg total) by mouth 2 (two) times daily with a meal. Patient not taking: Reported on 06/09/2020 02/09/20   Loletha Grayer, MD  docusate sodium (COLACE) 100 MG capsule Take 1 capsule (100 mg total) by mouth 2 (two) times daily as needed for mild constipation. Patient not taking: Reported on 06/09/2020 03/28/20   Lorella Nimrod, MD  feeding supplement, GLUCERNA SHAKE, (GLUCERNA SHAKE) LIQD Take 237 mLs by mouth 3 (three) times daily between meals. 05/13/20   Fritzi Mandes, MD  pantoprazole (PROTONIX) 40 MG tablet Take 1 tablet (40 mg total) by mouth daily. Patient not taking: Reported on 06/09/2020 04/03/20   Clapacs, Madie Reno, MD    Allergies Patient has no known allergies.  Family History  Problem Relation Age of Onset  . Diabetes Mother   . Diabetes Father     Social History Social History   Tobacco Use  . Smoking status: Former Smoker    Types: Cigars, Cigarettes  . Smokeless  tobacco: Never Used  Vaping Use  . Vaping Use: Never used  Substance Use Topics  . Alcohol use: No  . Drug use: No    Review of Systems  Review of Systems  Unable to perform ROS: Mental status change  Constitutional: Positive for malaise/fatigue.  Gastrointestinal: Positive for nausea and vomiting.  Neurological: Positive for weakness.      ____________________________________________   PHYSICAL EXAM:  VITAL SIGNS: ED Triage Vitals  Enc Vitals Group     BP 06/09/20 1250 (!) 72/51     Pulse Rate 06/09/20 1250 62     Resp 06/09/20 1250 15     Temp 06/09/20 1250 (!) 92 F (33.3 C)     Temp Source 06/09/20 1250 Axillary     SpO2 06/09/20 1250 100 %     Weight 06/09/20 1251 100 lb (45.4 kg)     Height 06/09/20 1251 5\' 8"  (1.727 m)     Head Circumference --      Peak Flow --      Pain Score 06/09/20 1250 6     Pain Loc --      Pain Edu? --      Excl. in [place]? --    Vitals:  06/09/20 1500 06/09/20 1731  BP: (!) 140/94   Pulse: (!) 59   Resp: 11   Temp:  (!) 97.5 F (36.4 C)  SpO2: 100%    Physical Exam Vitals and nursing note reviewed.  Constitutional:      Appearance: He is cachectic. He is ill-appearing.  HENT:     Head: Normocephalic and atraumatic.     Right Ear: External ear normal.     Left Ear: External ear normal.     Nose: Nose normal.     Mouth/Throat:     Mouth: Mucous membranes are dry.  Eyes:     Extraocular Movements: Extraocular movements intact.     Conjunctiva/sclera: Conjunctivae normal.     Pupils: Pupils are equal, round, and reactive to light.  Cardiovascular:     Rate and Rhythm: Normal rate and regular rhythm.     Pulses: Normal pulses.  Pulmonary:     Effort: Pulmonary effort is normal. No respiratory distress.     Breath sounds: No wheezing.  Musculoskeletal:     Cervical back: No rigidity.     Right lower leg: No edema.     Left lower leg: No edema.  Skin:    General: Skin is dry.     Capillary Refill: Capillary refill  takes more than 3 seconds.  Neurological:     General: No focal deficit present.     Mental Status: He is disoriented and confused.     Patient is able to follow commands with symmetric strength in all extremities.  He is not oriented to date or recent medical history.  Suprapubic catheter in place.  ____________________________________________   LABS (all labs ordered are listed, but only abnormal results are displayed)  Labs Reviewed  BASIC METABOLIC PANEL - Abnormal; Notable for the following components:      Result Value   Sodium 129 (*)    CO2 17 (*)    Glucose, Bld 340 (*)    BUN 48 (*)    Creatinine, Ser 2.96 (*)    Calcium 8.5 (*)    GFR calc non Af Amer 24 (*)    GFR calc Af Amer 28 (*)    All other components within normal limits  CBC - Abnormal; Notable for the following components:   RBC 3.24 (*)    Hemoglobin 9.8 (*)    HCT 28.0 (*)    RDW 15.9 (*)    All other components within normal limits  URINALYSIS, COMPLETE (UACMP) WITH MICROSCOPIC - Abnormal; Notable for the following components:   Color, Urine YELLOW (*)    APPearance TURBID (*)    Hgb urine dipstick LARGE (*)    Protein, ur 100 (*)    Leukocytes,Ua LARGE (*)    RBC / HPF >50 (*)    WBC, UA >50 (*)    Bacteria, UA MANY (*)    All other components within normal limits  HEPATIC FUNCTION PANEL - Abnormal; Notable for the following components:   Albumin 3.3 (*)    AST 10 (*)    All other components within normal limits  BASIC METABOLIC PANEL - Abnormal; Notable for the following components:   Sodium 128 (*)    CO2 13 (*)    Glucose, Bld 273 (*)    BUN 38 (*)    Creatinine, Ser 2.32 (*)    Calcium 7.8 (*)    GFR calc non Af Amer 33 (*)    GFR calc Af Amer 38 (*)  All other components within normal limits  BLOOD GAS, VENOUS - Abnormal; Notable for the following components:   pH, Ven 7.19 (*)    pCO2, Ven 33 (*)    Bicarbonate 12.6 (*)    Acid-base deficit 14.4 (*)    All other components  within normal limits  BETA-HYDROXYBUTYRIC ACID - Abnormal; Notable for the following components:   Beta-Hydroxybutyric Acid 0.46 (*)    All other components within normal limits  SARS CORONAVIRUS 2 BY RT PCR (HOSPITAL ORDER, [place] [facility])  CULTURE, BLOOD (ROUTINE X 2)  CULTURE, BLOOD (ROUTINE X 2)  URINE CULTURE  STOOL CULTURE  LACTIC ACID, PLASMA  PROTIME-INR  APTT  MAGNESIUM  AMMONIA  TSH  LIPASE, BLOOD  PROCALCITONIN  URINE DRUG SCREEN, QUALITATIVE ([facility] ONLY)  LACTIC ACID, PLASMA  BASIC METABOLIC PANEL  BASIC METABOLIC PANEL  BASIC METABOLIC PANEL  BASIC METABOLIC PANEL  ETHANOL  PROCALCITONIN  CBC  BASIC METABOLIC PANEL  BASIC METABOLIC PANEL  CBG MONITORING, ED  CBG MONITORING, ED   ____________________________________________  EKG  Sinus rhythm with a ventricular rate of 61, normal axis, unremarkable intervals, poor tracing and lead I, aVR, aVL, V1, V6 with otherwise no clear evidence of acute ischemia or other significant underlying arrhythmia. ____________________________________________  RADIOLOGY  Official radiology report(s): CT ABDOMEN PELVIS WO CONTRAST  Result Date: 06/09/2020 CLINICAL DATA:  Abdominal pain, nausea EXAM: CT ABDOMEN AND PELVIS WITHOUT CONTRAST TECHNIQUE: Multidetector CT imaging of the abdomen and pelvis was performed following the standard protocol without IV contrast. COMPARISON:  01/21/2020 FINDINGS: Lower chest: No acute abnormality. Hepatobiliary: No focal liver lesion identified on noncontrast exam. Gallbladder appears grossly unremarkable. No hyperdense gallstone. No biliary dilatation. Pancreas: Grossly unremarkable. Spleen: Normal in size without focal abnormality. Adrenals/Urinary Tract: Unremarkable adrenal glands. Kidneys have an unremarkable noncontrast appearance. Tiny 1 mm calcification within the inferior pole of the left kidney. No hydronephrosis. Suprapubic catheter terminates within the urinary  bladder lumen. Bladder is thick-walled. Small amount of air present within the bladder lumen, likely related to catheterization. Stomach/Bowel: Evaluation of the bowel is somewhat limited secondary to lack of intra-abdominal fat/cachexia. No dilated loops of bowel to suggest obstruction. No definite area of focal bowel wall thickening or inflammatory changes. Vascular/Lymphatic: No acute vascular findings on noncontrast study. No abdominopelvic lymphadenopathy is evident. Reproductive: Prostate is unremarkable. Other: No ascites.  No pneumoperitoneum.  No abdominal wall hernia. Musculoskeletal: Remote healed fractures of the left hemipelvis and proximal left femur. No new or acute osseous findings. IMPRESSION: 1. Suprapubic urinary bladder catheter in place. Bladder is thick-walled. Correlate with urinalysis to exclude cystitis. 2. Tiny 1 mm nonobstructing left renal calculus. Electronically Signed   By: Davina Poke D.O.   On: 06/09/2020 14:07   CT Head Wo Contrast  Result Date: 06/09/2020 CLINICAL DATA:  Mental status change, unknown cause. Additional history provided: Patient reports increased weakness, loss of appetite, nausea for approximately 1 week. EXAM: CT HEAD WITHOUT CONTRAST TECHNIQUE: Contiguous axial images were obtained from the base of the skull through the vertex without intravenous contrast. COMPARISON:  No pertinent prior exams are available for comparison. FINDINGS: Brain: Mild generalized parenchymal atrophy which is advanced for age. Mild ill-defined hypoattenuation within cerebral white matter which is nonspecific, but most commonly seen on the basis of chronic small vessel ischemia. There is no acute intracranial hemorrhage. No demarcated cortical infarct. No extra-axial fluid collection. No evidence of intracranial mass. No midline shift. Vascular: No hyperdense vessel. Skull: Normal. Negative  for fracture or focal lesion. Sinuses/Orbits: Visualized orbits show no acute finding.  Trace right maxillary sinus mucosal thickening at the imaged levels. No significant mastoid effusion at the imaged levels. IMPRESSION: No CT evidence of acute intracranial abnormality. Mild generalized parenchymal atrophy which is advanced for age. Mild cerebral white matter disease which is nonspecific, but most commonly seen on the basis of chronic small vessel ischemia. Electronically Signed   By: Kellie Simmering DO   On: 06/09/2020 15:41   DG Chest Port 1 View  Result Date: 06/09/2020 : Questionable sepsis, evaluate for abnormality. EXAM: PORTABLE CHEST 1 VIEW COMPARISON:  Prior chest radiograph 05/02/2020 and earlier, chest CT 12/29/2019. FINDINGS: Heart size within normal limits. Unchanged ovoid nodular opacity within the right upper lobe. No interval development of airspace disease elsewhere. No evidence of pleural effusion or pneumothorax. No acute bony abnormality identified. IMPRESSION: Unchanged ovoid nodular opacity within the right upper lobe better characterized on the prior chest CT of 12/29/2019. Please refer to this prior examination for further description. No airspace disease identified elsewhere within the chest. Electronically Signed   By: Kellie Simmering DO   On: 06/09/2020 13:37    ____________________________________________   PROCEDURES  Procedure(s) performed (including Critical Care):  Procedures   ____________________________________________   INITIAL IMPRESSION / ASSESSMENT AND PLAN / ED COURSE        Patient presents with left to history exam for evaluation of weakness associate with some nausea and vomiting.  Patient is noted to be hypothermic and hypotensive on arrival with altered mental status.  Otherwise stable vital signs on room air.  Exam as above remarkable for cachectic patient.  No obvious foci of infection.  Given hypothermia and hypotension on arrival code sepsis immediately initiated and patient resuscitated with IV fluids and active external warming.   Broad-spectrum IV antibiotics administered and blood cultures obtained.  In addition to sepsis differential includes metabolic, endocrine, and toxic ingestion.  Patient denies any toxic ingestion.  Ammonia is not elevated.  TSH WNL.  VBG obtained shows a pH of 7.19 with a PCO2 of 33, bicarb of 12.6.  Given beta hydroxybutyrate is 0.46 with a glucose in the 300s on arrival concern for possible DKA although is somewhat unexpected the patient has normal anion gap.  Insulin drip started in addition to sepsis work-up.  CT head obtained shows no evidence of intracranial bleeding and her no focal deficits suggest CVA.  Will plan to admit to hospital service for further evaluation management.      ____________________________________________   FINAL CLINICAL IMPRESSION(S) / ED DIAGNOSES  Final diagnoses:  Encephalopathy  Malnutrition, unspecified type (HCC)  Hypotension, unspecified hypotension type  Hypothermia, initial encounter  Nausea and vomiting, intractability of vomiting not specified, unspecified vomiting type  AKI (acute kidney injury) ([place])    Medications  lactated ringers infusion ( Intravenous Stopped 06/09/20 1813)  lactated ringers infusion ( Intravenous New Bag/Given 06/09/20 1814)  dextrose 50 % solution 0-50 mL (has no administration in time range)  potassium chloride 10 mEq in 100 mL IVPB (10 mEq Intravenous New Bag/Given (Non-Interop) 06/09/20 1803)  insulin glargine (LANTUS) injection 5 Units (has no administration in time range)  insulin aspart (novoLOG) injection 0-9 Units (has no administration in time range)  DULoxetine (CYMBALTA) DR capsule 30 mg (has no administration in time range)  sodium bicarbonate 150 mEq in dextrose 5 % 1,000 mL infusion ( Intravenous New Bag/Given 06/09/20 1824)  enoxaparin (LOVENOX) injection 40 mg (40 mg Subcutaneous Not Given  06/09/20 1804)  sodium chloride flush (NS) 0.9 % injection 3 mL (has no administration in time range)  acetaminophen  (TYLENOL) tablet 650 mg (has no administration in time range)    Or  acetaminophen (TYLENOL) suppository 650 mg (has no administration in time range)  ondansetron (ZOFRAN) tablet 4 mg (has no administration in time range)    Or  ondansetron (ZOFRAN) injection 4 mg (has no administration in time range)  calcium carbonate (TUMS - dosed in mg elemental calcium) chewable tablet 200 mg of elemental calcium (has no administration in time range)  lactated ringers bolus 1,000 mL (0 mLs Intravenous Stopped 06/09/20 1354)    And  lactated ringers bolus 500 mL (0 mLs Intravenous Stopped 06/09/20 1424)  ceFEPIme (MAXIPIME) 2 g in sodium chloride 0.9 % 100 mL IVPB (0 g Intravenous Stopped 06/09/20 1424)  metroNIDAZOLE (FLAGYL) IVPB 500 mg (0 mg Intravenous Stopped 06/09/20 1449)  vancomycin (VANCOCIN) IVPB 1000 mg/200 mL premix (0 mg Intravenous Stopped 06/09/20 1718)     ED Discharge Orders    None       Note:  This document was prepared using Dragon voice recognition software and may include unintentional dictation errors.   Lucrezia Starch, MD 06/09/20 [phone]

## 2020-06-09 NOTE — Progress Notes (Signed)
CODE SEPSIS - PHARMACY COMMUNICATION  **Broad Spectrum Antibiotics should be administered within 1 hour of Sepsis diagnosis**  Time Code Sepsis Called/Page Received: 1317  Antibiotics Ordered: vancomycin/cefepime/metronidazole  Time of 1st antibiotic administration: 1348  Additional action taken by pharmacy: n/a  Benita Gutter 06/09/2020  1:27 PM

## 2020-06-09 NOTE — Progress Notes (Signed)
PHARMACIST - PHYSICIAN COMMUNICATION  CONCERNING:  Enoxaparin (Lovenox) for DVT Prophylaxis    RECOMMENDATION: Patient was prescribed enoxaparin 40mg  q24 hours for VTE prophylaxis.   Filed Weights   06/09/20 1251  Weight: 45.4 kg (100 lb)    Body mass index is 15.2 kg/m.  Estimated Creatinine Clearance: 25.8 mL/min (A) (by C-G formula based on SCr of 2.32 mg/dL (H)).  Patient is candidate for enoxaparin 30mg  every 24 hours based on CrCl <48ml/min or Weight <45kg  DESCRIPTION: Pharmacy has adjusted enoxaparin dose per [facility] policy.  Patient is now receiving enoxaparin 30 mg every 24 hours    Benita Gutter 06/09/2020 8:58 PM

## 2020-06-09 NOTE — ED Notes (Signed)
Pt given juice per request.

## 2020-06-09 NOTE — Progress Notes (Signed)
06/09/2020 11:30 AM   Gregory Moody [birth year] [id number]  CC: Chief Complaint  Patient presents with  . Other    HPI: Gregory Moody is a 44 y.o. comorbid male with likely neurogenic bladder secondary to poorly managed diabetes managed with SP tube and recent ventral penile abscess who presents today for outpatient wound check. He missed his first SP tube exchange appointment with IR 3 days ago; it has been rescheduled for this week. He is accompanied today by his mother, who contributes to HPI.  Today he reports that his suprapubic catheter is draining well without problems and his penile abscess has improved. His mother has been performing abscess dressing changes daily at home and notes that the distal sinus has closed.   Additionally, patient reports anorexia, fatigue, weakness, nausea, and vomiting consistent with prior acute illnesses and hospitalizations. He reports he has not eaten in several days and fell this morning in the bathroom. He is not checking his blood sugar. He is not taking his insulin. He states his symptoms resolve every time he is admitted to the hospital and asks that I admit him today.  PMH: Past Medical History:  Diagnosis Date  . COVID-19   . Diabetes mellitus without complication ([place])   . Gastroparesis   . Tuberculosis     Surgical History: Past Surgical History:  Procedure Laterality Date  . COLONOSCOPY N/A 01/25/2020   Procedure: COLONOSCOPY;  Surgeon: Toledo, Benay Pike, MD;  Location: [facility] ENDOSCOPY;  Service: Gastroenterology;  Laterality: N/A;  . ESOPHAGOGASTRODUODENOSCOPY N/A 02/03/2019   Procedure: ESOPHAGOGASTRODUODENOSCOPY (EGD);  Surgeon: Toledo, Benay Pike, MD;  Location: [facility] ENDOSCOPY;  Service: Gastroenterology;  Laterality: N/A;    Home Medications:  Allergies as of 06/09/2020   No Known Allergies     Medication List       Accurate as of June 09, 2020 11:30 AM. If you have any questions, ask your nurse or  doctor.        aspirin EC 81 MG tablet Take 81 mg by mouth daily.   benztropine 0.5 MG tablet Commonly known as: COGENTIN Take 1 tablet (0.5 mg total) by mouth 2 (two) times daily.   colesevelam 625 MG tablet Commonly known as: WELCHOL Take 3 tablets (1,875 mg total) by mouth 2 (two) times daily with a meal.   docusate sodium 100 MG capsule Commonly known as: COLACE Take 1 capsule (100 mg total) by mouth 2 (two) times daily as needed for mild constipation.   DULoxetine 30 MG capsule Commonly known as: CYMBALTA Take 1 capsule (30 mg total) by mouth daily.   feeding supplement (GLUCERNA SHAKE) Liqd Take 237 mLs by mouth 3 (three) times daily between meals.   ferrous sulfate 325 (65 FE) MG EC tablet Take 1 tablet (325 mg total) by mouth 2 (two) times daily.   haloperidol 0.5 MG tablet Commonly known as: HALDOL Take 1 tablet (0.5 mg total) by mouth 2 (two) times daily.   insulin aspart 100 UNIT/ML injection Commonly known as: novoLOG Inject 0-9 Units into the skin 3 (three) times daily before meals. 3 times daily before meals and at bedtime   insulin glargine 100 UNIT/ML injection Commonly known as: LANTUS Inject 0.08 mLs (8 Units total) into the skin daily.   loperamide 2 MG capsule Commonly known as: IMODIUM Take 1 capsule (2 mg total) by mouth 2 (two) times daily as needed for diarrhea or loose stools.   multivitamin with minerals Tabs tablet Take 1 tablet by mouth daily.  NexIUM 40 MG capsule Generic drug: esomeprazole Take 40 mg by mouth daily.   pantoprazole 40 MG tablet Commonly known as: PROTONIX Take 1 tablet (40 mg total) by mouth daily.   sodium bicarbonate 650 MG tablet Take 2 tablets (1,300 mg total) by mouth 2 (two) times daily.       Allergies:  No Known Allergies  Family History: Family History  Problem Relation Age of Onset  . Diabetes Mother   . Diabetes Father     Social History:   reports that he has been smoking cigars and  cigarettes. He has never used smokeless tobacco. He reports that he does not drink alcohol and does not use drugs.  Physical Exam: BP 100/68   Pulse 70   Ht 5\' 9"  (1.753 m)   Wt 100 lb (45.4 kg)   BMI 14.77 kg/m   Constitutional:  Alert and oriented, cachectic HEENT: [place], AT Cardiovascular: No clubbing, cyanosis, or edema Respiratory: Normal respiratory effort, no increased work of breathing GU: Uncircumcised penis with reduced foreskin. Resolution of distal penile abscess sinus compared to prior. Proximal sinus is still open superficially without purulent drainage. No erythema, induration, or fluctuance of the penile shaft.  Assessment & Plan:   1. Penile abscess Significantly improved over prior.  Counseled patient to continue dressing changes until the proximal sinus closes entirely.  2. Neurogenic bladder I had a lengthy conversation with the patient and his mother today explaining the importance of keeping his catheter exchange appointments.  I explained that failure to exchange his catheter increases his risk for urinary infection or catheter occlusion due to urinary encrustation, which would lead to recurrent urinary retention and could cause upstream renal damage.  They expressed understanding.  I provided them a printed copy of his upcoming rescheduled appointment information.  3. Weakness Vital stable in clinic today.  Symptoms consistent with prior hospitalizations.  Counseled patient to proceed to the emergency department, as I cannot admit patients for non-urologic reasons.  He expressed understanding.  Return for SP tube exchanges per IR.  Debroah Loop, PA-C  [facility] [address] [place], [place] [address] [phone]

## 2020-06-09 NOTE — ED Notes (Signed)
Pt given dinner meal tray at this time.

## 2020-06-10 DIAGNOSIS — E1169 Type 2 diabetes mellitus with other specified complication: Secondary | ICD-10-CM

## 2020-06-10 LAB — BASIC METABOLIC PANEL
Anion gap: 11 (ref 5–15)
Anion gap: 7 (ref 5–15)
Anion gap: 7 (ref 5–15)
Anion gap: 7 (ref 5–15)
BUN: 37 mg/dL — ABNORMAL HIGH (ref 6–20)
BUN: 39 mg/dL — ABNORMAL HIGH (ref 6–20)
BUN: 40 mg/dL — ABNORMAL HIGH (ref 6–20)
BUN: 43 mg/dL — ABNORMAL HIGH (ref 6–20)
CO2: 18 mmol/L — ABNORMAL LOW (ref 22–32)
CO2: 22 mmol/L (ref 22–32)
CO2: 22 mmol/L (ref 22–32)
CO2: 24 mmol/L (ref 22–32)
Calcium: 8.2 mg/dL — ABNORMAL LOW (ref 8.9–10.3)
Calcium: 8.3 mg/dL — ABNORMAL LOW (ref 8.9–10.3)
Calcium: 8.3 mg/dL — ABNORMAL LOW (ref 8.9–10.3)
Calcium: 8.6 mg/dL — ABNORMAL LOW (ref 8.9–10.3)
Chloride: 100 mmol/L (ref 98–111)
Chloride: 100 mmol/L (ref 98–111)
Chloride: 99 mmol/L (ref 98–111)
Chloride: 99 mmol/L (ref 98–111)
Creatinine, Ser: 2.22 mg/dL — ABNORMAL HIGH (ref 0.61–1.24)
Creatinine, Ser: 2.29 mg/dL — ABNORMAL HIGH (ref 0.61–1.24)
Creatinine, Ser: 2.31 mg/dL — ABNORMAL HIGH (ref 0.61–1.24)
Creatinine, Ser: 2.59 mg/dL — ABNORMAL HIGH (ref 0.61–1.24)
GFR calc Af Amer: 33 mL/min — ABNORMAL LOW (ref >60)
GFR calc Af Amer: 38 mL/min — ABNORMAL LOW (ref >60)
GFR calc Af Amer: 39 mL/min — ABNORMAL LOW (ref >60)
GFR calc Af Amer: 40 mL/min — ABNORMAL LOW (ref >60)
GFR calc non Af Amer: 29 mL/min — ABNORMAL LOW (ref >60)
GFR calc non Af Amer: 33 mL/min — ABNORMAL LOW (ref >60)
GFR calc non Af Amer: 33 mL/min — ABNORMAL LOW (ref >60)
GFR calc non Af Amer: 35 mL/min — ABNORMAL LOW (ref >60)
Glucose, Bld: 179 mg/dL — ABNORMAL HIGH (ref 70–99)
Glucose, Bld: 221 mg/dL — ABNORMAL HIGH (ref 70–99)
Glucose, Bld: 252 mg/dL — ABNORMAL HIGH (ref 70–99)
Glucose, Bld: 299 mg/dL — ABNORMAL HIGH (ref 70–99)
Potassium: 3.6 mmol/L (ref 3.5–5.1)
Potassium: 3.6 mmol/L (ref 3.5–5.1)
Potassium: 3.6 mmol/L (ref 3.5–5.1)
Potassium: 3.7 mmol/L (ref 3.5–5.1)
Sodium: 128 mmol/L — ABNORMAL LOW (ref 135–145)
Sodium: 129 mmol/L — ABNORMAL LOW (ref 135–145)
Sodium: 129 mmol/L — ABNORMAL LOW (ref 135–145)
Sodium: 130 mmol/L — ABNORMAL LOW (ref 135–145)

## 2020-06-10 LAB — BLOOD CULTURE ID PANEL (REFLEXED) - BCID2

## 2020-06-10 LAB — CBC
HCT: 24.2 % — ABNORMAL LOW (ref 39.0–52.0)
Hemoglobin: 8.7 g/dL — ABNORMAL LOW (ref 13.0–17.0)
MCH: 30.9 pg (ref 26.0–34.0)
MCHC: 36 g/dL (ref 30.0–36.0)
MCV: 85.8 fL (ref 80.0–100.0)
Platelets: 231 10*3/uL (ref 150–400)
RBC: 2.82 MIL/uL — ABNORMAL LOW (ref 4.22–5.81)
RDW: 15.8 % — ABNORMAL HIGH (ref 11.5–15.5)
WBC: 4.9 10*3/uL (ref 4.0–10.5)
nRBC: 0 % (ref 0.0–0.2)

## 2020-06-10 LAB — BLOOD GAS, VENOUS
Acid-base deficit: 14.4 mmol/L — ABNORMAL HIGH (ref 0.0–2.0)
Bicarbonate: 12.6 mmol/L — ABNORMAL LOW (ref 20.0–28.0)
O2 Saturation: 40.6 %
Patient temperature: 37
pCO2, Ven: 33 mmHg — ABNORMAL LOW (ref 44.0–60.0)
pH, Ven: 7.19 — CL (ref 7.250–7.430)

## 2020-06-10 LAB — ETHANOL: Alcohol, Ethyl (B): <10 mg/dL (ref <10)

## 2020-06-10 LAB — GLUCOSE, CAPILLARY
Glucose-Capillary: 221 mg/dL — ABNORMAL HIGH (ref 70–99)
Glucose-Capillary: 168 mg/dL — ABNORMAL HIGH (ref 70–99)
Glucose-Capillary: 100 mg/dL — ABNORMAL HIGH (ref 70–99)
Glucose-Capillary: 156 mg/dL — ABNORMAL HIGH (ref 70–99)

## 2020-06-10 LAB — PROCALCITONIN: Procalcitonin: <0.1 ng/mL

## 2020-06-10 MED ORDER — GLUCERNA SHAKE PO LIQD
237.0000 mL | Freq: Two times a day (BID) | ORAL | Status: DC
  Administered 2020-06-12 – 2020-06-16 (×7): 237 mL via ORAL

## 2020-06-10 MED ORDER — SODIUM CHLORIDE 0.9 % IV SOLN: INTRAVENOUS | Status: DC

## 2020-06-10 MED ORDER — ADULT MULTIVITAMIN W/MINERALS CH
1.0000 | ORAL_TABLET | Freq: Every day | ORAL | Status: DC
  Administered 2020-06-10 – 2020-06-19 (×10): 1 via ORAL
  Filled 2020-06-10 (×10): qty 1

## 2020-06-10 MED ORDER — INSULIN GLARGINE 100 UNIT/ML ~~LOC~~ SOLN
5.0000 [IU] | Freq: Every day | SUBCUTANEOUS | Status: DC
  Administered 2020-06-10 – 2020-06-18 (×9): 5 [IU] via SUBCUTANEOUS
  Filled 2020-06-10 (×10): qty 0.05

## 2020-06-10 MED ORDER — SCOPOLAMINE 1 MG/3DAYS TD PT72
1.0000 | MEDICATED_PATCH | TRANSDERMAL | Status: DC
  Administered 2020-06-10 – 2020-06-19 (×4): 1.5 mg via TRANSDERMAL
  Filled 2020-06-10 (×5): qty 1

## 2020-06-10 MED ORDER — DRONABINOL 2.5 MG PO CAPS
2.5000 mg | ORAL_CAPSULE | Freq: Two times a day (BID) | ORAL | Status: DC
  Administered 2020-06-10 – 2020-06-19 (×19): 2.5 mg via ORAL
  Filled 2020-06-10 (×19): qty 1

## 2020-06-10 MED ORDER — ENSURE MAX PROTEIN PO LIQD
11.0000 [oz_av] | Freq: Every day | ORAL | Status: DC
  Administered 2020-06-10 – 2020-06-16 (×5): 11 [oz_av] via ORAL
  Filled 2020-06-10: qty 330

## 2020-06-10 NOTE — Progress Notes (Signed)
PT Cancellation Note  Patient Details Name: Gregory Moody MRN: [id number] DOB: [birth year]   Cancelled Treatment:    Reason Eval/Treat Not Completed: Other (comment) Pt currently in bed eating lunch and initially refused then requests that PT attempt later today. Will attempt at a later time if time permits.   [name] 06/10/2020, 11:49 AM

## 2020-06-10 NOTE — Progress Notes (Signed)
PROGRESS NOTE    Gregory Moody  IZT:[id number] DOB: 04/21/1976 DOA: 06/09/2020 PCP: [place]   Assessment & Plan:   Active Problems:   Encephalopathy   Possible sepsis: presented w/ hypothermia & hypotension that resolved w/ IVFs. CXR shows no acute findings. UA appears dirty but has chronic suprapubic catheter. Blood cxs NGTD  & urine cx is pending. Normal WBC, procal <0.10. Urine drug screen is neg & ethanol level is <10. CT head shows no acute intracranial findings but atrophy present & advanced for age. Will hold off on abxs currently and continue to monitor   Generalized weakness: etiology unclear. OT recs home health. PT consulted   Metabolic acidosis: resolved  DM2: poorly controlled. Continue on lantus, SSI w/ accuchecks. 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 Improving   Chronic urinary retention: s/p suprapubic catheter, last changed 06/06/20 as per pt.   Malnutrition: likely severe protein-calorie. Will consult nutrition  Diarrhea: chronic x 2-3 years. Stool culture ordered.   Hyponatremia: continue on IVFs. Will continue to monitor  Hypocalcemia: continue on calcium supplements. Will continue to monitor   Possibly ACD: no need for a transfusion at this time. Will continue to monitor   DVT prophylaxis: lovenox Code Status: full  Family Communication:  Disposition Plan:   Consultants:      Procedures:    Antimicrobials:    Subjective: Pt c/o not having an appetite  Objective: Vitals:   06/09/20 2200 06/09/20 2300 06/09/20 2328 06/10/20 0329  BP: 99/69 96/67 114/78 107/80  Pulse: 68 70 70 74  Resp: 12 18 17 18   Temp:   98 F (36.7 C) 98.3 F (36.8 C)  TempSrc:   Oral Oral  SpO2: 100% 100% 100% 100%  Weight:      Height:        Intake/Output Summary (Last 24 hours) at 06/10/2020 0716 Last data filed at 06/10/2020 0522 Gross per 24 hour  Intake 4103.33 ml    Output 600 ml  Net 3503.33 ml   Filed Weights   06/09/20 1251  Weight: 45.4 kg    Examination:  General exam: Appears agitated  Respiratory system: Clear to auscultation. Respiratory effort normal. Cardiovascular system: S1 & S2+. No  rubs, gallops or clicks. No pedal edema. Gastrointestinal system: Abdomen is nondistended, soft and nontender. Normal bowel sounds heard. Central nervous system: Alert and oriented. Moves all 4 extremities  Psychiatry: Judgement and insight appear normal. Agitated & frustrated     Data Reviewed: I have personally reviewed following labs and imaging studies  CBC: Recent Labs  Lab 06/09/20 1301 06/10/20 0357  WBC 6.7 4.9  HGB 9.8* 8.7*  HCT 28.0* 24.2*  MCV 86.4 85.8  PLT 280 382   Basic Metabolic Panel: Recent Labs  Lab 06/09/20 1301 06/09/20 1320 06/09/20 2357 06/10/20 0357  NA 129* 128* 128* 130*  K 4.1 4.1 3.6 3.6  CL 103 105 99 99  CO2 17* 13* 18* 24  GLUCOSE 340* 273* 299* 252*  BUN 48* 38* 43* 40*  CREATININE 2.96* 2.32* 2.59* 2.31*  CALCIUM 8.5* 7.8* 8.6* 8.3*  MG  --  2.2  --   --    GFR: Estimated Creatinine Clearance: 25.9 mL/min (A) (by C-G formula based on SCr of 2.31 mg/dL (H)). Liver Function Tests: Recent Labs  Lab 06/09/20 1301  AST 10*  ALT 8  ALKPHOS 56  BILITOT 1.2  PROT 7.0  ALBUMIN 3.3*   Recent Labs  Lab 06/09/20 1301  LIPASE 45   Recent Labs  Lab 06/09/20 1320  AMMONIA 12   Coagulation Profile: Recent Labs  Lab 06/09/20 1320  INR 1.2   Cardiac Enzymes: No results for input(s): CKTOTAL, CKMB, CKMBINDEX, TROPONINI in the last 168 hours. BNP (last 3 results) No results for input(s): PROBNP in the last 8760 hours. HbA1C: No results for input(s): HGBA1C in the last 72 hours. CBG: Recent Labs  Lab 06/09/20 2051 06/09/20 2355  GLUCAP 383* 301*   Lipid Profile: No results for input(s): CHOL, HDL, LDLCALC, TRIG, CHOLHDL, LDLDIRECT in the last 72 hours. Thyroid Function  Tests: Recent Labs    06/09/20 1320  TSH 2.102   Anemia Panel: No results for input(s): VITAMINB12, FOLATE, FERRITIN, TIBC, IRON, RETICCTPCT in the last 72 hours. Sepsis Labs: Recent Labs  Lab 06/09/20 1301 06/10/20 0357  PROCALCITON <0.10 <0.10  LATICACIDVEN 1.2  --     Recent Results (from the past 240 hour(s))  Blood Culture (routine x 2)     Status: None (Preliminary result)   Collection Time: 06/09/20  1:20 PM   Specimen: BLOOD  Result Value Ref Range Status   Specimen Description BLOOD RIGHT ANTECUBITAL  Final   Special Requests   Final    BOTTLES DRAWN AEROBIC AND ANAEROBIC Blood Culture adequate volume   Culture   Final    NO GROWTH < 24 HOURS Performed at [facility], [address] [place], [place] [address]    Report Status PENDING  Incomplete  Blood Culture (routine x 2)     Status: None (Preliminary result)   Collection Time: 06/09/20  1:20 PM   Specimen: BLOOD  Result Value Ref Range Status   Specimen Description BLOOD BLOOD RIGHT HAND  Final   Special Requests   Final    BOTTLES DRAWN AEROBIC AND ANAEROBIC Blood Culture adequate volume   Culture   Final    NO GROWTH < 24 HOURS Performed at [facility], [address] [place], [place] [address]    Report Status PENDING  Incomplete  SARS Coronavirus 2 by RT PCR (hospital order, performed in [place] hospital lab) Nasopharyngeal Nasopharyngeal Swab     Status: None   Collection Time: 06/09/20  1:20 PM   Specimen: Nasopharyngeal Swab  Result Value Ref Range Status   SARS Coronavirus 2 NEGATIVE NEGATIVE Final    Comment: (NOTE) SARS-CoV-2 target nucleic acids are NOT DETECTED.  The SARS-CoV-2 RNA is generally detectable in upper and lower respiratory specimens during the acute phase of infection. The lowest concentration of SARS-CoV-2 viral copies this assay can detect is 250 copies / mL. A negative result does not preclude SARS-CoV-2 infection and should not be used as the  sole basis for treatment or other patient management decisions.  A negative result may occur with improper specimen collection / handling, submission of specimen other than nasopharyngeal swab, presence of viral mutation(s) within the areas targeted by this assay, and inadequate number of viral copies (<250 copies / mL). A negative result must be combined with clinical observations, patient history, and epidemiological information.  Fact Sheet for Patients:   StrictlyIdeas.no  Fact Sheet for Healthcare Providers: BankingDealers.co.za  This test is not yet approved or  cleared by the [place] FDA and has been authorized for detection and/or diagnosis of SARS-CoV-2 by FDA under an Emergency Use Authorization (EUA).  This EUA will remain in effect (meaning this test can be used) for the duration of the COVID-19  declaration under Section 564(b)(1) of the Act, 21 U.S.C. section 360bbb-3(b)(1), unless the authorization is terminated or revoked sooner.  Performed at [facility], [address] [place], [place] [address]          Radiology Studies: CT ABDOMEN PELVIS WO CONTRAST  Result Date: 06/09/2020 CLINICAL DATA:  Abdominal pain, nausea EXAM: CT ABDOMEN AND PELVIS WITHOUT CONTRAST TECHNIQUE: Multidetector CT imaging of the abdomen and pelvis was performed following the standard protocol without IV contrast. COMPARISON:  01/21/2020 FINDINGS: Lower chest: No acute abnormality. Hepatobiliary: No focal liver lesion identified on noncontrast exam. Gallbladder appears grossly unremarkable. No hyperdense gallstone. No biliary dilatation. Pancreas: Grossly unremarkable. Spleen: Normal in size without focal abnormality. Adrenals/Urinary Tract: Unremarkable adrenal glands. Kidneys have an unremarkable noncontrast appearance. Tiny 1 mm calcification within the inferior pole of the left kidney. No hydronephrosis. Suprapubic catheter  terminates within the urinary bladder lumen. Bladder is thick-walled. Small amount of air present within the bladder lumen, likely related to catheterization. Stomach/Bowel: Evaluation of the bowel is somewhat limited secondary to lack of intra-abdominal fat/cachexia. No dilated loops of bowel to suggest obstruction. No definite area of focal bowel wall thickening or inflammatory changes. Vascular/Lymphatic: No acute vascular findings on noncontrast study. No abdominopelvic lymphadenopathy is evident. Reproductive: Prostate is unremarkable. Other: No ascites.  No pneumoperitoneum.  No abdominal wall hernia. Musculoskeletal: Remote healed fractures of the left hemipelvis and proximal left femur. No new or acute osseous findings. IMPRESSION: 1. Suprapubic urinary bladder catheter in place. Bladder is thick-walled. Correlate with urinalysis to exclude cystitis. 2. Tiny 1 mm nonobstructing left renal calculus. Electronically Signed   By: Davina Poke D.O.   On: 06/09/2020 14:07   CT Head Wo Contrast  Result Date: 06/09/2020 CLINICAL DATA:  Mental status change, unknown cause. Additional history provided: Patient reports increased weakness, loss of appetite, nausea for approximately 1 week. EXAM: CT HEAD WITHOUT CONTRAST TECHNIQUE: Contiguous axial images were obtained from the base of the skull through the vertex without intravenous contrast. COMPARISON:  No pertinent prior exams are available for comparison. FINDINGS: Brain: Mild generalized parenchymal atrophy which is advanced for age. Mild ill-defined hypoattenuation within cerebral white matter which is nonspecific, but most commonly seen on the basis of chronic small vessel ischemia. There is no acute intracranial hemorrhage. No demarcated cortical infarct. No extra-axial fluid collection. No evidence of intracranial mass. No midline shift. Vascular: No hyperdense vessel. Skull: Normal. Negative for fracture or focal lesion. Sinuses/Orbits: Visualized  orbits show no acute finding. Trace right maxillary sinus mucosal thickening at the imaged levels. No significant mastoid effusion at the imaged levels. IMPRESSION: No CT evidence of acute intracranial abnormality. Mild generalized parenchymal atrophy which is advanced for age. Mild cerebral white matter disease which is nonspecific, but most commonly seen on the basis of chronic small vessel ischemia. Electronically Signed   By: Kellie Simmering DO   On: 06/09/2020 15:41   DG Chest Port 1 View  Result Date: 06/09/2020 : Questionable sepsis, evaluate for abnormality. EXAM: PORTABLE CHEST 1 VIEW COMPARISON:  Prior chest radiograph 05/02/2020 and earlier, chest CT 12/29/2019. FINDINGS: Heart size within normal limits. Unchanged ovoid nodular opacity within the right upper lobe. No interval development of airspace disease elsewhere. No evidence of pleural effusion or pneumothorax. No acute bony abnormality identified. IMPRESSION: Unchanged ovoid nodular opacity within the right upper lobe better characterized on the prior chest CT of 12/29/2019. Please refer to this prior examination for further description. No airspace disease identified elsewhere within  the chest. Electronically Signed   By: Kellie Simmering DO   On: 06/09/2020 13:37        Scheduled Meds: . calcium carbonate  1 tablet Oral TID WC  . DULoxetine  30 mg Oral Daily  . enoxaparin (LOVENOX) injection  30 mg Subcutaneous Q24H  . insulin aspart  0-9 Units Subcutaneous TID WC  . insulin glargine  5 Units Subcutaneous Daily  . sodium chloride flush  3 mL Intravenous Q12H   Continuous Infusions: . lactated ringers Stopped (06/09/20 1813)  . lactated ringers 125 mL/hr at 06/10/20 0701  . sodium bicarbonate (isotonic) 150 mEq in D5W 1000 mL infusion 75 mL/hr at 06/09/20 1824     LOS: 0 days    Time spent: 33 mins    Wyvonnia Dusky, MD [facility] Pager [phone]  If 7PM-7AM, please contact  night-coverage [email or web address] 06/10/2020, 7:16 AM

## 2020-06-10 NOTE — TOC Progression Note (Signed)
Transition of Care [facility]) - Progression Note    Patient Details  Name: Ladanian Kelter MRN: [id number] Date of Birth: 1976-02-15  Transition [facility] CM/SW Contact  Su Hilt, RN Phone Number: 06/10/2020, 9:09 AM  Clinical Narrative:   The patient was a recent admit and discharged to his mother's home, he received a 3 in 1 and a RW last visit when he discharged on 8/3, he was opened with [place] thru [facility] last admission          Expected Discharge Plan and Services                                                 Social Determinants of Health (SDOH) Interventions    Readmission Risk Interventions Readmission Risk Prevention Plan 05/09/2020 04/09/2020 03/31/2020  Transportation Screening Complete Complete Complete  PCP or Specialist Appt within 3-5 Days - - -  Social Work Consult for [place] - - -  Medication Review Press photographer) Complete Complete Complete  PCP or Specialist appointment within 3-5 days of discharge - Patient refused -  [place] or [place] - Not Complete Complete  SW Recovery Care/Counseling Consult - - Complete  Palliative Care Screening - Complete Not [place] Not Applicable Not Applicable Not Applicable  Some recent data might be hidden

## 2020-06-10 NOTE — Evaluation (Signed)
Occupational Therapy Evaluation Patient Details Name: Gregory Moody MRN: [id number] DOB: 07/31/1976 Today's Date: 06/10/2020    History of Present Illness 44 y/o M w/ PMH of DM, GERD, gastroparesis, DKA, anemia, CKD, malnutrition, COVID-19 infection (04/23/2019), chronic urinary retention s/p suprapubic catheter, and multiple admissions who presents w/ weakness & nausea x 1 week. Pt c/o difficulty walking & no appetite for 1 week. Pt does use a walker.   Clinical Impression   Pt was seen for OT evaluation this date. Prior to hospital admission, pt was living with his parents and receiving assist for ADL and IADL tasks. Pt denies difficulties with medication mgt which his parents assist him with, per pt report. Pt endorses at least 20 falls in past 12 months with last fall a couple days prior to admission. Pt reports that over the past week pt has felt weaker and has resorted to "crawling like a cat" to minimize risk of falling. Pt currently demonstrates impairments as described below (See OT problem list) which functionally limit his ability to perform ADL/self-care tasks and ADL mobility. Pt currently requires supervision for bed mobility, CGA for ADL transfers with RW, and Min A for LB ADL tasks.  Pt would benefit from skilled OT to address noted impairments and functional limitations (see below for any additional details) in order to maximize safety and independence while minimizing falls risk and caregiver burden. Upon hospital discharge, recommend HHOT to maximize pt safety and return to functional independence during meaningful occupations of daily life.     Follow Up Recommendations  Home health OT;Supervision/Assistance - 24 hour    Equipment Recommendations  None recommended by OT    Recommendations for Other Services       Precautions / Restrictions Precautions Precautions: Fall Restrictions Weight Bearing Restrictions: No      Mobility Bed Mobility Overal bed mobility:  Needs Assistance Bed Mobility: Supine to Sit     Supine to sit: Modified independent (Device/Increase time)     General bed mobility comments: increased time/effort to perform but able to perform without physical assist, just minimal use of bed rails  Transfers Overall transfer level: Needs assistance Equipment used: Rolling walker (2 wheeled) Transfers: Sit to/from Stand Sit to Stand: Min guard         General transfer comment: slightly elevated surface    Balance Overall balance assessment: History of Falls;Needs assistance Sitting-balance support: Feet supported;Single extremity supported Sitting balance-Leahy Scale: Fair     Standing balance support: Bilateral upper extremity supported Standing balance-Leahy Scale: Fair Standing balance comment: reliant on RW but no LOB noted                           ADL either performed or assessed with clinical judgement   ADL Overall ADL's : Needs assistance/impaired Eating/Feeding: Set up   Grooming: Set up   Upper Body Bathing: Sitting;Set up;Supervision/ safety   Lower Body Bathing: Sit to/from stand;Minimal assistance   Upper Body Dressing : Sitting;Supervision/safety;Set up   Lower Body Dressing: Sit to/from stand;Minimal assistance   Toilet Transfer: RW;Ambulation;BSC;Min guard           Functional mobility during ADLs: Min guard;Rolling walker       Vision Patient Visual Report: No change from baseline       Perception     Praxis      Pertinent Vitals/Pain Pain Assessment: No/denies pain     Hand Dominance Right   Extremity/Trunk Assessment Upper Extremity  Assessment Upper Extremity Assessment: Generalized weakness   Lower Extremity Assessment Lower Extremity Assessment: Generalized weakness   Cervical / Trunk Assessment Cervical / Trunk Assessment: Kyphotic   Communication Communication Communication: Interpreter utilized (in person Spanish-speaking interpreter utilized  Environmental education officer))   Cognition Arousal/Alertness: Awake/alert Behavior During Therapy: WFL for tasks assessed/performed Overall Cognitive Status: Within Functional Limits for tasks assessed                                     General Comments       Exercises     Shoulder Instructions      Home Living Family/patient expects to be discharged to:: Private residence Living Arrangements: Children;Parent Available Help at Discharge: Family;Available 24 hours/day Type of Home: Mobile home Home Access: Stairs to enter Entrance Stairs-Number of Steps: 5 Entrance Stairs-Rails: Right;Left Home Layout: One level     Bathroom Shower/Tub: Occupational psychologist: Standard     Home Equipment: Environmental consultant - 2 wheels;Cane - quad;Cane - single point          Prior Functioning/Environment Level of Independence: Needs assistance  Gait / Transfers Assistance Needed: Pt reports using RW but over the part 1 week he has felt weaker so he has been "crawling like a cat" so as not to fall ADL's / Homemaking Assistance Needed: Pt does not provide detail in regards to amount of assist but denies need for increased assist from baseline since last admission   Comments: Pt reports at least 20 falls in past 12 months, last one occurred 2 days before this admission        OT Problem List: Decreased strength;Decreased activity tolerance;Decreased knowledge of use of DME or AE;Impaired balance (sitting and/or standing)      OT Treatment/Interventions: Self-care/ADL training;Therapeutic exercise;Therapeutic activities;DME and/or AE instruction;Energy conservation;Patient/family education;Balance training    OT Goals(Current goals can be found in the care plan section) Acute Rehab OT Goals Patient Stated Goal: get stronger and go home OT Goal Formulation: With patient Time For Goal Achievement: 06/24/20 Potential to Achieve Goals: Fair ADL Goals Pt Will Perform Lower Body  Dressing: with min assist;sit to/from stand Pt Will Transfer to Toilet: with supervision;bedside commode;ambulating (LRAD for amb) Additional ADL Goal #1: Pt will verbalize plan to implement at least 1 learned falls prevention strategy to minimize falls risk.  OT Frequency: Min 2X/week   Barriers to D/C:            Co-evaluation              AM-PAC OT "6 Clicks" Daily Activity     Outcome Measure Help from another person eating meals?: None Help from another person taking care of personal grooming?: None Help from another person toileting, which includes using toliet, bedpan, or urinal?: A Little Help from another person bathing (including washing, rinsing, drying)?: A Little Help from another person to put on and taking off regular upper body clothing?: A Little Help from another person to put on and taking off regular lower body clothing?: A Little 6 Click Score: 20   End of Session Equipment Utilized During Treatment: Gait belt;Rolling walker Nurse Communication: Mobility status;Other (comment) (pt reported mild dizziness with sup>sit EOB, resolved)  Activity Tolerance: Patient tolerated treatment well Patient left: in chair;with call bell/phone within reach;with chair alarm set  OT Visit Diagnosis: Other abnormalities of gait and mobility (R26.89);Repeated falls (R29.6);Muscle weakness (generalized) (M62.81)  Time: 1020-1041 OT Time Calculation (min): 21 min Charges:  OT General Charges $OT Visit: 1 Visit OT Evaluation $OT Eval Moderate Complexity: 1 Mod  Jeni Salles, MPH, MS, OTR/L ascom [phone] 06/10/20, 10:59 AM

## 2020-06-10 NOTE — Progress Notes (Signed)
PHARMACY - PHYSICIAN COMMUNICATION CRITICAL VALUE ALERT - BLOOD CULTURE IDENTIFICATION (BCID)  Gregory Moody is an 44 y.o. male who presented to [facility] on 06/09/2020 with a chief complaint of encephalopathy  Assessment:  Blood culture with 1 of 4 GPC, BCID staphylococcus species.   Name of physician (or Provider) Contacted: Dr Lenise Herald  Current antibiotics: none  Changes to prescribed antibiotics recommended:  Likely contaminant - no antibiotics needed  Results for orders placed or performed during the hospital encounter of 06/09/20  Blood Culture ID Panel (Reflexed) (Collected: 06/09/2020  1:20 PM)  Result Value Ref Range   Enterococcus faecalis NOT DETECTED NOT DETECTED   Enterococcus Faecium NOT DETECTED NOT DETECTED   Listeria monocytogenes NOT DETECTED NOT DETECTED   Staphylococcus species DETECTED (A) NOT DETECTED   Staphylococcus aureus (BCID) NOT DETECTED NOT DETECTED   Staphylococcus epidermidis NOT DETECTED NOT DETECTED   Staphylococcus lugdunensis NOT DETECTED NOT DETECTED   Streptococcus species NOT DETECTED NOT DETECTED   Streptococcus agalactiae NOT DETECTED NOT DETECTED   Streptococcus pneumoniae NOT DETECTED NOT DETECTED   Streptococcus pyogenes NOT DETECTED NOT DETECTED   A.calcoaceticus-baumannii NOT DETECTED NOT DETECTED   Bacteroides fragilis NOT DETECTED NOT DETECTED   Enterobacterales NOT DETECTED NOT DETECTED   Enterobacter cloacae complex NOT DETECTED NOT DETECTED   Escherichia coli NOT DETECTED NOT DETECTED   Klebsiella aerogenes NOT DETECTED NOT DETECTED   Klebsiella oxytoca NOT DETECTED NOT DETECTED   Klebsiella pneumoniae NOT DETECTED NOT DETECTED   Proteus species NOT DETECTED NOT DETECTED   Salmonella species NOT DETECTED NOT DETECTED   Serratia marcescens NOT DETECTED NOT DETECTED   Haemophilus influenzae NOT DETECTED NOT DETECTED   Neisseria meningitidis NOT DETECTED NOT DETECTED   Pseudomonas aeruginosa NOT DETECTED NOT DETECTED    Stenotrophomonas maltophilia NOT DETECTED NOT DETECTED   Candida albicans NOT DETECTED NOT DETECTED   Candida auris NOT DETECTED NOT DETECTED   Candida glabrata NOT DETECTED NOT DETECTED   Candida krusei NOT DETECTED NOT DETECTED   Candida parapsilosis NOT DETECTED NOT DETECTED   Candida tropicalis NOT DETECTED NOT DETECTED   Cryptococcus neoformans/gattii NOT DETECTED NOT DETECTED    Doreene Eland, PharmD, BCPS.   Work Cell: [phone] 06/10/2020 5:01 PM

## 2020-06-10 NOTE — Evaluation (Signed)
Physical Therapy Evaluation Patient Details Name: Gregory Moody MRN: [id number] DOB: 16-Aug-1976 Today's Date: 06/10/2020   History of Present Illness  44 y/o M w/ PMH of DM, GERD, gastroparesis, DKA, anemia, CKD, malnutrition, COVID-19 infection (04/23/2019), chronic urinary retention s/p suprapubic catheter, and multiple admissions who presents w/ weakness & nausea x 1 week. Pt c/o difficulty walking & no appetite for 1 week. Pt does use a walker.  Clinical Impression  Pt lying in bed upon arrival to room. Interpreter Helyn App present throughout session. Pt agreeable to PT evalution. Pt performed bed mobility with mod I requiring increased time and use of bedrails however required physical assistance from clinician. Pt performed sit <> stand, able to self-initiate and then required CGA for steadying from mid stance to full upright and for eccentric control on descent. Pt performed standing marching to fatigue with heavy UE support through RW. Pt required CGA for steadying. Pt sat quickly to bed secondary to fatigue. Pt deferred ambulation attempts today secondary to self reports of weakness. Pt currently presents with deficits in strength, endurance, pain, balance, and functional mobility. Pt would benefit from skilled PT to address current deficits and HHPT at discharge with supervision for OOB activities/mobility to optimize return to PLOF, maximize functional mobility/safety/independence, and decrease fall risk.    Follow Up Recommendations Home health PT;Supervision for mobility/OOB    Equipment Recommendations  None recommended by PT    Recommendations for Other Services       Precautions / Restrictions Precautions Precautions: Fall Restrictions Weight Bearing Restrictions: No      Mobility  Bed Mobility Overal bed mobility: Modified Independent Bed Mobility: Supine to Sit;Sit to Supine     Supine to sit: Modified independent (Device/Increase time) Sit to supine: Modified  independent (Device/Increase time)   General bed mobility comments: pt required no external physical assistance however required increased time and use of bedrail to perform supine <> sit; reports of minimal dizziness once seated edge of bed  Transfers Overall transfer level: Needs assistance Equipment used: Rolling walker (2 wheeled) Transfers: Sit to/from Stand Sit to Stand: Min guard         General transfer comment: pt initiated sit to stand transfer and required CGA from midstance to full upright stance; CGA for eccentric control on descent secondary to fatigue  Ambulation/Gait             General Gait Details: pt deferred  Stairs            Wheelchair Mobility    Modified Rankin (Stroke Patients Only)       Balance Overall balance assessment: Needs assistance;History of Falls Sitting-balance support: Feet supported;Bilateral upper extremity supported Sitting balance-Leahy Scale: Fair Sitting balance - Comments: pt initially without BUE support and noted to sit with forward rounded shoulders/posture; after standing activities, pt with elbows resting on knees secondary to fatigue   Standing balance support: Bilateral upper extremity supported Standing balance-Leahy Scale: Fair Standing balance comment: heavy usage of BUE on RW but no overt LOB noted                             Pertinent Vitals/Pain Pain Assessment: 0-10 Pain Score: 8  Pain Location: bilateral feet, specifically the heels Pain Descriptors / Indicators: Aching Pain Intervention(s): Monitored during session    Home Living Family/patient expects to be discharged to:: Private residence Living Arrangements: Children;Parent Available Help at Discharge: Family;Available 24 hours/day Type of Home: Mobile home  Home Access: Stairs to enter Entrance Stairs-Rails: Right;Left Entrance Stairs-Number of Steps: 5 Home Layout: One level Home Equipment: Walker - 2 wheels;Cane - quad;Cane  - single point      Prior Function Level of Independence: Needs assistance   Gait / Transfers Assistance Needed: Pt reports using RW but over the part 1 week he has felt weaker so he has been "crawling like a cat" so as not to fall     Comments: Pt reports at least 20 falls in past 12 months, last one occurred 2 days before this admission     Hand Dominance        Extremity/Trunk Assessment   Upper Extremity Assessment Upper Extremity Assessment: Generalized weakness;Overall WFL for tasks assessed (grossly 4 to 4+/5 bilaterally)    Lower Extremity Assessment Lower Extremity Assessment: Overall WFL for tasks assessed;Generalized weakness (grossly 4/5 bilaterally)    Cervical / Trunk Assessment Cervical / Trunk Assessment: Kyphotic  Communication   Communication: Interpreter utilized ([place] interpreter)  Cognition Arousal/Alertness: Awake/alert Behavior During Therapy: WFL for tasks assessed/performed Overall Cognitive Status: Within Functional Limits for tasks assessed                                        General Comments      Exercises Other Exercises Other Exercises: pt performed open chain therex in supine with verbal cues for correct techique; pt performed standing marching with BUE on RW to fatigue (approx. 30-45 secs each)   Assessment/Plan    PT Assessment Patient needs continued PT services  PT Problem List Decreased strength;Decreased activity tolerance;Decreased balance;Decreased mobility;Pain       PT Treatment Interventions DME instruction;Gait training;Stair training;Functional mobility training;Therapeutic activities;Therapeutic exercise;Balance training;Patient/family education    PT Goals (Current goals can be found in the Care Plan section)  Acute Rehab PT Goals Patient Stated Goal: get stronger and go home PT Goal Formulation: With patient Time For Goal Achievement: 06/24/20 Potential to Achieve Goals: Fair     Frequency Min 2X/week   Barriers to discharge        Co-evaluation               AM-PAC PT "6 Clicks" Mobility  Outcome Measure Help needed turning from your back to your side while in a flat bed without using bedrails?: None Help needed moving from lying on your back to sitting on the side of a flat bed without using bedrails?: A Little Help needed moving to and from a bed to a chair (including a wheelchair)?: A Little Help needed standing up from a chair using your arms (e.g., wheelchair or bedside chair)?: A Little Help needed to walk in hospital room?: A Little Help needed climbing 3-5 steps with a railing? : A Little 6 Click Score: 19    End of Session Equipment Utilized During Treatment: Gait belt Activity Tolerance: Patient limited by fatigue Patient left: in bed;with call bell/phone within reach;with bed alarm set Nurse Communication: Mobility status PT Visit Diagnosis: Unsteadiness on feet (R26.81);Other abnormalities of gait and mobility (R26.89);Muscle weakness (generalized) (M62.81);History of falling (Z91.81);Dizziness and giddiness (R42);Pain Pain - Right/Left:  (bilateral) Pain - part of body:  (heels)    Time: 0938-1829 PT Time Calculation (min) (ACUTE ONLY): 30 min   Charges:              [name], SPT  [name] 06/10/2020, 4:04 PM

## 2020-06-10 NOTE — Progress Notes (Signed)
Initial Nutrition Assessment  DOCUMENTATION CODES:   Underweight, Severe malnutrition in context of chronic illness  INTERVENTION:  Glucerna Shake po BID, each supplement provides 220 kcal and 10 grams of protein (strawberry) Ensure Max po daily, each supplement provides 150 kcal and 30 grams of protein MVI with minerals daily  Recommend monitoring magnesium, potassium, and phosphorus daily for at least 3 days, MD to replete as needed, as pt is at risk for refeeding syndrome given chronic severe malnutrition, poor po intake over the last week per pt report  NUTRITION DIAGNOSIS:   Severe Malnutrition related to chronic illness (IDDM complicated by gastroparesis) as evidenced by severe fat depletion, severe muscle depletion, percent weight loss.  GOAL:   Patient will meet greater than or equal to 90% of their needs    MONITOR:   PO intake, Weight trends, Labs, I & O's, Supplement acceptance  REASON FOR ASSESSMENT:   Consult Poor PO, Assessment of nutrition requirement/status  ASSESSMENT:    44 year old male with history of  IDDM complicated by gastroparesis, chronic urinary retention s/p suprapubic catheter, TB, COVID-19 infection (07/20), multiple admissions secondary to uncontrolled diabetes, urinary retention, and malnutrition presented with weakness, nausea, decreased appetite and difficulty walking over the past week admitted with possible sepsis.  Patient is familiar to nutrition department due to multiple admissions. Patient awake and alert this afternoon, lunch tray sitting on sink counter, RD observed <50% completion. He reports appetite has not been so good lately, says he normally eats 2-3 meals/days and more recently he has only been eating a few bites at meal times and is not hungry. Patient started on appetite stimulant BID this admission. He likes strawberry and vanilla flavored supplements, will order strawberry Glucerna BID, and vanilla Ensure Max daily to aid with  meeting needs. Patient is at risk for refeeding given report of poor po intake as well as chronic malnutrition, recommend monitoring electrolytes daily as po intake improves.   Per chart, weights have trended down ~10 lb (9.4%) in the last month; significant. Patient with severe fat and muscle depletions to entire body and meets criteria for severe malnutrition.   Medications reviewed and include: Tums, Marinol, SSI, Lantus IVF: NaCl @ 75 ml/hr  Labs: CBGs 168,221,301,383 x 24 hrs, Na 129 (L), BUN 37 (H), Cr 2.29 (H), Hgb 8.7 (L), HCT 24.2 (L) A1c 8.3 - 05/02/20   NUTRITION - FOCUSED PHYSICAL EXAM:    Most Recent Value  Orbital Region Severe depletion  Upper Arm Region Severe depletion  Thoracic and Lumbar Region Severe depletion  Buccal Region Severe depletion  Temple Region Severe depletion  Clavicle Bone Region Severe depletion  Clavicle and Acromion Bone Region Severe depletion  Scapular Bone Region Unable to assess  Dorsal Hand Severe depletion  Patellar Region Severe depletion  Anterior Thigh Region Severe depletion  Posterior Calf Region Severe depletion  Edema (RD Assessment) None  Hair Reviewed  Eyes Reviewed  Mouth Reviewed  [poor dentition]  Skin Reviewed  Nails Reviewed       Diet Order:   Diet Order            Diet Carb Modified Fluid consistency: Thin; Room service appropriate? Yes  Diet effective now                 EDUCATION NEEDS:   Education needs have been addressed  Skin:  Skin Assessment: Reviewed RN Assessment  Last BM:  8/30  Height:   Ht Readings from Last 1 Encounters:  06/09/20 5\' 8"  (1.727 m)    Weight:   Wt Readings from Last 1 Encounters:  06/09/20 45.4 kg   BMI:  Body mass index is 15.2 kg/m.  Estimated Nutritional Needs:   Kcal:  1600-1800  Protein:  80-90  Fluid:  >/= 1.6 L/day    Lajuan Lines, RD, LDN Clinical Nutrition After Hours/Weekend Pager # in [place]

## 2020-06-11 DIAGNOSIS — D638 Anemia in other chronic diseases classified elsewhere: Secondary | ICD-10-CM

## 2020-06-11 DIAGNOSIS — F329 Major depressive disorder, single episode, unspecified: Secondary | ICD-10-CM

## 2020-06-11 DIAGNOSIS — E43 Unspecified severe protein-calorie malnutrition: Secondary | ICD-10-CM

## 2020-06-11 DIAGNOSIS — R339 Retention of urine, unspecified: Secondary | ICD-10-CM

## 2020-06-11 DIAGNOSIS — Z794 Long term (current) use of insulin: Secondary | ICD-10-CM

## 2020-06-11 DIAGNOSIS — E871 Hypo-osmolality and hyponatremia: Secondary | ICD-10-CM

## 2020-06-11 DIAGNOSIS — K219 Gastro-esophageal reflux disease without esophagitis: Secondary | ICD-10-CM

## 2020-06-11 DIAGNOSIS — I959 Hypotension, unspecified: Secondary | ICD-10-CM

## 2020-06-11 DIAGNOSIS — E1165 Type 2 diabetes mellitus with hyperglycemia: Secondary | ICD-10-CM

## 2020-06-11 DIAGNOSIS — T68XXXA Hypothermia, initial encounter: Secondary | ICD-10-CM

## 2020-06-11 LAB — BASIC METABOLIC PANEL
Anion gap: 8 (ref 5–15)
BUN: 31 mg/dL — ABNORMAL HIGH (ref 6–20)
CO2: 23 mmol/L (ref 22–32)
Calcium: 8.7 mg/dL — ABNORMAL LOW (ref 8.9–10.3)
Chloride: 97 mmol/L — ABNORMAL LOW (ref 98–111)
Creatinine, Ser: 2.3 mg/dL — ABNORMAL HIGH (ref 0.61–1.24)
GFR calc Af Amer: 38 mL/min — ABNORMAL LOW (ref >60)
GFR calc non Af Amer: 33 mL/min — ABNORMAL LOW (ref >60)
Glucose, Bld: 170 mg/dL — ABNORMAL HIGH (ref 70–99)
Potassium: 3.7 mmol/L (ref 3.5–5.1)
Sodium: 128 mmol/L — ABNORMAL LOW (ref 135–145)

## 2020-06-11 LAB — CBC
HCT: 26 % — ABNORMAL LOW (ref 39.0–52.0)
Hemoglobin: 9.1 g/dL — ABNORMAL LOW (ref 13.0–17.0)
MCH: 30.2 pg (ref 26.0–34.0)
MCHC: 35 g/dL (ref 30.0–36.0)
MCV: 86.4 fL (ref 80.0–100.0)
Platelets: 227 10*3/uL (ref 150–400)
RBC: 3.01 MIL/uL — ABNORMAL LOW (ref 4.22–5.81)
RDW: 15.6 % — ABNORMAL HIGH (ref 11.5–15.5)
WBC: 5.5 10*3/uL (ref 4.0–10.5)
nRBC: 0 % (ref 0.0–0.2)

## 2020-06-11 LAB — GLUCOSE, CAPILLARY
Glucose-Capillary: 139 mg/dL — ABNORMAL HIGH (ref 70–99)
Glucose-Capillary: 157 mg/dL — ABNORMAL HIGH (ref 70–99)
Glucose-Capillary: 204 mg/dL — ABNORMAL HIGH (ref 70–99)
Glucose-Capillary: 92 mg/dL (ref 70–99)

## 2020-06-11 LAB — PROCALCITONIN: Procalcitonin: <0.1 ng/mL

## 2020-06-11 MED ORDER — BENZTROPINE MESYLATE 0.5 MG PO TABS
0.5000 mg | ORAL_TABLET | Freq: Two times a day (BID) | ORAL | Status: DC
  Administered 2020-06-11 – 2020-06-19 (×17): 0.5 mg via ORAL
  Filled 2020-06-11 (×19): qty 1

## 2020-06-11 MED ORDER — LOPERAMIDE HCL 2 MG PO CAPS
2.0000 mg | ORAL_CAPSULE | Freq: Two times a day (BID) | ORAL | Status: DC | PRN
  Filled 2020-06-11: qty 1

## 2020-06-11 MED ORDER — PANTOPRAZOLE SODIUM 40 MG PO TBEC
40.0000 mg | DELAYED_RELEASE_TABLET | Freq: Every day | ORAL | Status: DC
  Administered 2020-06-11 – 2020-06-19 (×9): 40 mg via ORAL
  Filled 2020-06-11 (×9): qty 1

## 2020-06-11 MED ORDER — ENSURE ENLIVE PO LIQD
237.0000 mL | Freq: Two times a day (BID) | ORAL | Status: DC
  Administered 2020-06-11 – 2020-06-17 (×10): 237 mL via ORAL

## 2020-06-11 MED ORDER — FERROUS SULFATE 325 (65 FE) MG PO TABS
325.0000 mg | ORAL_TABLET | Freq: Two times a day (BID) | ORAL | Status: DC
  Administered 2020-06-11 – 2020-06-19 (×16): 325 mg via ORAL
  Filled 2020-06-11 (×16): qty 1

## 2020-06-11 MED ORDER — FLUCONAZOLE IN SODIUM CHLORIDE 200-0.9 MG/100ML-% IV SOLN
200.0000 mg | Freq: Once | INTRAVENOUS | Status: AC
  Administered 2020-06-11: 200 mg via INTRAVENOUS
  Filled 2020-06-11: qty 100

## 2020-06-11 MED ORDER — GLUCERNA SHAKE PO LIQD
237.0000 mL | Freq: Three times a day (TID) | ORAL | Status: DC
  Administered 2020-06-12 – 2020-06-17 (×10): 237 mL via ORAL

## 2020-06-11 MED ORDER — HALOPERIDOL 0.5 MG PO TABS
0.5000 mg | ORAL_TABLET | Freq: Two times a day (BID) | ORAL | Status: DC
  Administered 2020-06-11 – 2020-06-19 (×16): 0.5 mg via ORAL
  Filled 2020-06-11 (×18): qty 1

## 2020-06-11 MED ORDER — SODIUM BICARBONATE 650 MG PO TABS
1300.0000 mg | ORAL_TABLET | Freq: Two times a day (BID) | ORAL | Status: DC
  Administered 2020-06-11 – 2020-06-19 (×15): 1300 mg via ORAL
  Filled 2020-06-11 (×18): qty 2

## 2020-06-11 MED ORDER — SODIUM CHLORIDE 0.9 % IV BOLUS
500.0000 mL | Freq: Once | INTRAVENOUS | Status: AC
  Administered 2020-06-11: 500 mL via INTRAVENOUS

## 2020-06-11 MED ORDER — FLUCONAZOLE 100 MG PO TABS
100.0000 mg | ORAL_TABLET | Freq: Every day | ORAL | Status: DC
  Administered 2020-06-12: 100 mg via ORAL
  Filled 2020-06-11: qty 1

## 2020-06-11 MED ORDER — SODIUM CHLORIDE 0.9 % IV SOLN
1.0000 g | INTRAVENOUS | Status: DC
  Administered 2020-06-11 – 2020-06-12 (×2): 1 g via INTRAVENOUS
  Filled 2020-06-11: qty 10
  Filled 2020-06-11 (×2): qty 1

## 2020-06-11 NOTE — Progress Notes (Signed)
PROGRESS NOTE    Gregory Moody  ZJI:[id number] DOB: 10/16/1975 DOA: 06/09/2020 PCP: [facility]   Chief Complaint  Patient presents with  . Weakness    Brief Narrative:  HPI per Dr. Jimmye Norman 44 y/o M w/ PMH of DM2, chronic urinary retention s/p suprapubic catheter, possible depression who presents w/ weakness & nausea x 1 week. Pt is a poor historian. Pt c/o difficulty walking & no appetite for 1 week. Pt does use a walker. Pt denies any sick contacts. Pt denies getting COVID19 vaccine. Also, pt c/o diarrhea daily x 2-3 years. Pt has approx 2-3 episodes of diarrhea daily. Pt denies any bright red blood or black stools. Of note, pt is allergic to milk. Pt denies any fevers, chills, sweating, cough, chest pain, shortness of breath, abd pain, or constipation. In the ED patient noted to be hypothermic and hypotensive and placed on IV fluids, patient pancultured.  Patient did receive a dose of IV antibiotics in the ED.   Assessment & Plan:   Active Problems:   GERD (gastroesophageal reflux disease)   Hypotension   Anemia of chronic disease   Protein-calorie malnutrition, severe   Diarrhea   Urinary retention   Hyperglycemia due to type 2 diabetes mellitus (HCC)   Generalized weakness   Encephalopathy  1 hypothermia/hypotension/rule out sepsis Patient had presented with generalized weakness noted to be hypothermic and hypotensive on admission.  Blood pressure on presentation responded to IV fluids.  Patient pancultured.  Chest x-ray done negative for any acute abnormalities.  Urinalysis appeared dirty however patient noted to have a chronic suprapubic catheter.  Cultures obtained.  Procalcitonin less than 0.1.  UDS negative.  Alcohol level was less than 10.  CT head which was done showed no acute intracranial findings but atrophy present and advanced for age.  Patient currently this morning noted to have systolic blood pressures in the 50s with repeat in the low  90s.  Give a bolus 500 cc normal saline.  Patient was supposed to be on IV fluids however during my examination the room patient noted not to be on IV fluids.  Placed on IV fluids normal saline at 100 cc/h.  Check a a.m. level cortisol, check a TSH.  Placed empirically on IV Rocephin pending culture results as preliminary cultures with greater than 100,000 colonies of E. coli and Klebsiella pneumoniae. Follow.  Supportive care.  2.  Generalized weakness Questionable etiology.  Could be secondary to patient's hypothermia, hypotension in the setting of poor oral intake.  Patient pancultured.  Check a TSH.  Check a random cortisol level. PT/OT.  Follow.  3.  Metabolic acidosis Resolved.  4.  Poorly controlled diabetes mellitus type 2 Hemoglobin A1c 8.3(05/02/2020).  CBG of 139 this morning.  Continue current dose of Lantus 5 units daily.  Sliding scale insulin.  Follow.  5.  Chronic urinary retention status post suprapubic catheter Catheter last changed 06/06/2020 per patient.  Urinalysis on admission was turbid, large leukocytes, nitrite negative, greater than 50 WBCs, many bacteria, budding yeast present.  Urinalysis with urine cultures pending with preliminary results with greater than 100,000 colonies of E. coli and Klebsiella pneumonia.  Urinalysis also with some budding yeast.  Patient will be placed empirically on IV Rocephin as well as Diflucan.  Follow.  6.  Hyponatremia Likely secondary to hypovolemic hyponatremia.  We will give a fluid bolus.  IV fluids.  Follow.  If no improvement with hyponatremia will check urine electrolytes, urine and serum osmole lites.  Check a TSH.  Check a cortisol level.  IV fluids.  Supportive care.  7.  Chronic diarrhea Stool cultures ordered and currently pending.  Outpatient follow-up.  8.  Severe protein calorie malnutrition Nutrition consulted.  Place on nutritional supplementation.  9.  chronic kidney disease stage IIIb Renal function seems to be close  to baseline.  Placed back on home regimen of bicarb tablets.  Monitor with hydration.  Follow.  10.  Gastroesophageal reflux disease PPI.  11.  Depression Stable.  Resume home regimen Cymbalta, Cogentin, Haldol.  12.  Anemia of chronic disease H&H stable.  Resume home regimen oral iron supplementation.   DVT prophylaxis: Lovenox Code Status: Full Family Communication: Updated patient.  No family at bedside. Disposition:   Status is: Inpatient    Dispo: The patient is from: Home              Anticipated d/c is to: Likely home with home health versus SNF              Anticipated d/c date is: To be determined.              Patient currently with ongoing hypotension, generalized weakness.  Infectious work-up underway.  Not stable for discharge.       Consultants:   None  Procedures:   CT head 06/09/2020  CT abdomen and pelvis 06/09/2020  Chest x-ray 06/09/2020    Antimicrobials:  IV Rocephin 06/11/2020   Subjective: C/o nausea.  Denies any chest pain or shortness of breath.  Per RN patient with systolic blood pressures in the 50s with repeat systolic in the 44I.  Per RN patient with complaints of significant weakness this morning on the way to the bathroom.  Objective: Vitals:   06/11/20 1031 06/11/20 1038 06/11/20 1226 06/11/20 1435  BP: (!) 55/44 91/72 (!) 142/90 129/89  Pulse: 82 76 75 75  Resp:   18 17  Temp: 97.7 F (36.5 C) 97.6 F (36.4 C) 98.4 F (36.9 C) 98.9 F (37.2 C)  TempSrc: Oral  Oral Oral  SpO2: 100% 100% 100% 100%  Weight:      Height:        Intake/Output Summary (Last 24 hours) at 06/11/2020 1519 Last data filed at 06/11/2020 1414 Gross per 24 hour  Intake 1572.29 ml  Output 1575 ml  Net -2.71 ml   Filed Weights   06/09/20 1251  Weight: 45.4 kg    Examination:  General exam: NAD.  Frail.  Cachectic. Respiratory system: Clear to auscultation.  No wheezes, no crackles, no rhonchi.  Respiratory effort normal. Cardiovascular  system: S1 & S2 heard, RRR. No JVD, murmurs, rubs, gallops or clicks. No pedal edema. Gastrointestinal system: Abdomen is nondistended, soft and nontender. No organomegaly or masses felt. Normal bowel sounds heard. Gu: Suprapubic catheter in place Central nervous system: Alert and oriented. No focal neurological deficits. Extremities: Symmetric 5 x 5 power. Skin: No rashes, lesions or ulcers Psychiatry: Judgement and insight appear normal. Mood & affect appropriate.     Data Reviewed: I have personally reviewed following labs and imaging studies  CBC: Recent Labs  Lab 06/09/20 1301 06/10/20 0357 06/11/20 0518  WBC 6.7 4.9 5.5  HGB 9.8* 8.7* 9.1*  HCT 28.0* 24.2* 26.0*  MCV 86.4 85.8 86.4  PLT 280 231 347    Basic Metabolic Panel: Recent Labs  Lab 06/09/20 1320 06/09/20 1320 06/09/20 2357 06/10/20 0357 06/10/20 0804 06/10/20 1114 06/11/20 0518  NA 128*   < >  128* 130* 129* 129* 128*  K 4.1   < > 3.6 3.6 3.6 3.7 3.7  CL 105   < > 99 99 100 100 97*  CO2 13*   < > 18* 24 22 22 23   GLUCOSE 273*   < > 299* 252* 221* 179* 170*  BUN 38*   < > 43* 40* 39* 37* 31*  CREATININE 2.32*   < > 2.59* 2.31* 2.22* 2.29* 2.30*  CALCIUM 7.8*   < > 8.6* 8.3* 8.3* 8.2* 8.7*  MG 2.2  --   --   --   --   --   --    < > = values in this interval not displayed.    GFR: Estimated Creatinine Clearance: 26 mL/min (A) (by C-G formula based on SCr of 2.3 mg/dL (H)).  Liver Function Tests: Recent Labs  Lab 06/09/20 1301  AST 10*  ALT 8  ALKPHOS 56  BILITOT 1.2  PROT 7.0  ALBUMIN 3.3*    CBG: Recent Labs  Lab 06/10/20 1153 06/10/20 1640 06/10/20 2151 06/11/20 0801 06/11/20 1150  GLUCAP 168* 100* 156* 139* 157*     Recent Results (from the past 240 hour(s))  Blood Culture (routine x 2)     Status: None (Preliminary result)   Collection Time: 06/09/20  1:20 PM   Specimen: BLOOD  Result Value Ref Range Status   Specimen Description   Final    BLOOD RIGHT  ANTECUBITAL Performed at [facility], [address] [place], [place] [address]    Special Requests   Final    BOTTLES DRAWN AEROBIC AND ANAEROBIC Blood Culture adequate volume Performed at [facility], [address] [place], [place] [address]    Culture  Setup Time   Final    AEROBIC BOTTLE ONLY GRAM POSITIVE COCCI Organism ID to follow CRITICAL RESULT CALLED TO, READ BACK BY AND VERIFIED WITH: MYRA SLAUGHTER 06/10/20 AT 1450 BY ACR GRAM STAIN REVIEWED-AGREE WITH RESULT    Culture   Final    GRAM POSITIVE COCCI IDENTIFICATION TO FOLLOW Performed at [facility], [place] [address], [place], [place] [address]    Report Status PENDING  Incomplete  Blood Culture (routine x 2)     Status: None (Preliminary result)   Collection Time: 06/09/20  1:20 PM   Specimen: BLOOD  Result Value Ref Range Status   Specimen Description BLOOD BLOOD RIGHT HAND  Final   Special Requests   Final    BOTTLES DRAWN AEROBIC AND ANAEROBIC Blood Culture adequate volume   Culture   Final    NO GROWTH 2 DAYS Performed at [facility], [address], [place], [place] [address]    Report Status PENDING  Incomplete  Urine culture     Status: Abnormal (Preliminary result)   Collection Time: 06/09/20  1:20 PM   Specimen: In/Out Cath Urine  Result Value Ref Range Status   Specimen Description   Final    IN/OUT CATH URINE Performed at [facility], [address] [place], [place] [address]    Special Requests   Final    NONE Performed at [facility], [address], [place], [place] [address]    Culture (A)  Final    >=100,000 COLONIES/mL ESCHERICHIA COLI >=100,000 COLONIES/mL KLEBSIELLA PNEUMONIAE    Report Status PENDING  Incomplete  SARS Coronavirus 2 by RT PCR (hospital order, performed in [place] hospital lab) Nasopharyngeal Nasopharyngeal Swab     Status:  None   Collection Time: 06/09/20  1:20 PM   Specimen: Nasopharyngeal Swab   Result Value Ref Range Status   SARS Coronavirus 2 NEGATIVE NEGATIVE Final    Comment: (NOTE) SARS-CoV-2 target nucleic acids are NOT DETECTED.  The SARS-CoV-2 RNA is generally detectable in upper and lower respiratory specimens during the acute phase of infection. The lowest concentration of SARS-CoV-2 viral copies this assay can detect is 250 copies / mL. A negative result does not preclude SARS-CoV-2 infection and should not be used as the sole basis for treatment or other patient management decisions.  A negative result may occur with improper specimen collection / handling, submission of specimen other than nasopharyngeal swab, presence of viral mutation(s) within the areas targeted by this assay, and inadequate number of viral copies (<250 copies / mL). A negative result must be combined with clinical observations, patient history, and epidemiological information.  Fact Sheet for Patients:   StrictlyIdeas.no  Fact Sheet for Healthcare Providers: BankingDealers.co.za  This test is not yet approved or  cleared by the [place] FDA and has been authorized for detection and/or diagnosis of SARS-CoV-2 by FDA under an Emergency Use Authorization (EUA).  This EUA will remain in effect (meaning this test can be used) for the duration of the COVID-19 declaration under Section 564(b)(1) of the Act, 21 U.S.C. section 360bbb-3(b)(1), unless the authorization is terminated or revoked sooner.  Performed at [facility], [address] [place], [place] [address]   Blood Culture ID Panel (Reflexed)     Status: Abnormal   Collection Time: 06/09/20  1:20 PM  Result Value Ref Range Status   Enterococcus faecalis NOT DETECTED NOT DETECTED Final   Enterococcus Faecium NOT DETECTED NOT DETECTED Final   Listeria monocytogenes NOT DETECTED NOT DETECTED Final   Staphylococcus species DETECTED (A) NOT DETECTED Final    Comment:  CRITICAL RESULT CALLED TO, READ BACK BY AND VERIFIED WITH: MYRA SLAUGHTER 06/10/20 AT 1450 BY AR    Staphylococcus aureus (BCID) NOT DETECTED NOT DETECTED Final   Staphylococcus epidermidis NOT DETECTED NOT DETECTED Final   Staphylococcus lugdunensis NOT DETECTED NOT DETECTED Final   Streptococcus species NOT DETECTED NOT DETECTED Final   Streptococcus agalactiae NOT DETECTED NOT DETECTED Final   Streptococcus pneumoniae NOT DETECTED NOT DETECTED Final   Streptococcus pyogenes NOT DETECTED NOT DETECTED Final   A.calcoaceticus-baumannii NOT DETECTED NOT DETECTED Final   Bacteroides fragilis NOT DETECTED NOT DETECTED Final   Enterobacterales NOT DETECTED NOT DETECTED Final   Enterobacter cloacae complex NOT DETECTED NOT DETECTED Final   Escherichia coli NOT DETECTED NOT DETECTED Final   Klebsiella aerogenes NOT DETECTED NOT DETECTED Final   Klebsiella oxytoca NOT DETECTED NOT DETECTED Final   Klebsiella pneumoniae NOT DETECTED NOT DETECTED Final   Proteus species NOT DETECTED NOT DETECTED Final   Salmonella species NOT DETECTED NOT DETECTED Final   Serratia marcescens NOT DETECTED NOT DETECTED Final   Haemophilus influenzae NOT DETECTED NOT DETECTED Final   Neisseria meningitidis NOT DETECTED NOT DETECTED Final   Pseudomonas aeruginosa NOT DETECTED NOT DETECTED Final   Stenotrophomonas maltophilia NOT DETECTED NOT DETECTED Final   Candida albicans NOT DETECTED NOT DETECTED Final   Candida auris NOT DETECTED NOT DETECTED Final   Candida glabrata NOT DETECTED NOT DETECTED Final   Candida krusei NOT DETECTED NOT DETECTED Final   Candida parapsilosis NOT DETECTED NOT DETECTED Final   Candida tropicalis NOT DETECTED NOT DETECTED Final   Cryptococcus neoformans/gattii NOT DETECTED NOT DETECTED Final  Comment: Performed at [facility], [address] [place], [place] [address]  CULTURE, BLOOD (ROUTINE X 2) w Reflex to ID Panel     Status: None (Preliminary result)   Collection  Time: 06/10/20  5:35 PM   Specimen: BLOOD  Result Value Ref Range Status   Specimen Description BLOOD LEFT ANTECUBITAL  Final   Special Requests   Final    BOTTLES DRAWN AEROBIC AND ANAEROBIC Blood Culture adequate volume   Culture   Final    NO GROWTH < 12 HOURS Performed at [facility], [address], [place], [place] [address]    Report Status PENDING  Incomplete  CULTURE, BLOOD (ROUTINE X 2) w Reflex to ID Panel     Status: None (Preliminary result)   Collection Time: 06/10/20  5:41 PM   Specimen: BLOOD  Result Value Ref Range Status   Specimen Description BLOOD BLOOD LEFT HAND  Final   Special Requests   Final    BOTTLES DRAWN AEROBIC ONLY Blood Culture adequate volume   Culture   Final    NO GROWTH < 12 HOURS Performed at [facility], [address] [place], [place] [address]    Report Status PENDING  Incomplete         Radiology Studies: CT Head Wo Contrast  Result Date: 06/09/2020 CLINICAL DATA:  Mental status change, unknown cause. Additional history provided: Patient reports increased weakness, loss of appetite, nausea for approximately 1 week. EXAM: CT HEAD WITHOUT CONTRAST TECHNIQUE: Contiguous axial images were obtained from the base of the skull through the vertex without intravenous contrast. COMPARISON:  No pertinent prior exams are available for comparison. FINDINGS: Brain: Mild generalized parenchymal atrophy which is advanced for age. Mild ill-defined hypoattenuation within cerebral white matter which is nonspecific, but most commonly seen on the basis of chronic small vessel ischemia. There is no acute intracranial hemorrhage. No demarcated cortical infarct. No extra-axial fluid collection. No evidence of intracranial mass. No midline shift. Vascular: No hyperdense vessel. Skull: Normal. Negative for fracture or focal lesion. Sinuses/Orbits: Visualized orbits show no acute finding. Trace right maxillary sinus mucosal thickening at the imaged  levels. No significant mastoid effusion at the imaged levels. IMPRESSION: No CT evidence of acute intracranial abnormality. Mild generalized parenchymal atrophy which is advanced for age. Mild cerebral white matter disease which is nonspecific, but most commonly seen on the basis of chronic small vessel ischemia. Electronically Signed   By: Kellie Simmering DO   On: 06/09/2020 15:41        Scheduled Meds: . calcium carbonate  1 tablet Oral TID WC  . dronabinol  2.5 mg Oral BID AC  . DULoxetine  30 mg Oral Daily  . enoxaparin (LOVENOX) injection  30 mg Subcutaneous Q24H  . feeding supplement (GLUCERNA SHAKE)  237 mL Oral BID BM  . [START ON 06/12/2020] fluconazole  100 mg Oral Daily  . insulin aspart  0-9 Units Subcutaneous TID WC  . insulin glargine  5 Units Subcutaneous QHS  . multivitamin with minerals  1 tablet Oral Daily  . pantoprazole  40 mg Oral Daily  . Ensure Max Protein  11 oz Oral Daily  . scopolamine  1 patch Transdermal Q72H  . sodium chloride flush  3 mL Intravenous Q12H   Continuous Infusions: . sodium chloride 100 mL/hr at 06/11/20 1212  . cefTRIAXone (ROCEPHIN)  IV    . fluconazole (DIFLUCAN) IV       LOS: 1 day  Time spent: 40 minutes    [name], MD [facility]   To contact the attending provider between 7A-7P or the covering provider during after hours 7P-7A, please log into the web site [email or web address] and access using universal [place] password for that web site. If you do not have the password, please call the hospital operator.  06/11/2020, 3:19 PM

## 2020-06-11 NOTE — Progress Notes (Signed)
   06/11/20 1031  Vitals  Temp 97.7 F (36.5 C)  Temp Source Oral  BP (!) 55/44  MAP (mmHg) (!) 50  BP Location Right Arm  BP Method Automatic  Patient Position (if appropriate) Sitting  Pulse Rate 82  Pulse Rate Source Monitor  MEWS COLOR  MEWS Score Color Yellow  Oxygen Therapy  SpO2 100 %  O2 Device Room Air  MEWS Score  MEWS Temp 0  MEWS Systolic 3  MEWS Pulse 0  MEWS RR 0  MEWS LOC 0  MEWS Score 3  Provider Notification  Provider Name/Title Thmopson, MD  Date Provider Notified 06/11/20  Time Provider Notified 1030  Notification Type Page  Notification Reason Change in status  Response See new orders  Date of Provider Response 06/11/20  Time of Provider Response 1049  Rapid Response Notification  Name of Rapid Response RN Notified Serenity RN/ Sarah RN  Date Rapid Response Notified 06/11/20  Time Rapid Response Notified 1042

## 2020-06-11 NOTE — Progress Notes (Deleted)
   06/11/20 1031  Assess: MEWS Score  Temp 97.7 F (36.5 C)  BP (!) 55/44  Pulse Rate 82  SpO2 100 %  O2 Device Room Air  Assess: MEWS Score  MEWS Temp 0  MEWS Systolic 3  MEWS Pulse 0  MEWS RR 0  MEWS LOC 0  MEWS Score 3  MEWS Score Color Yellow  Assess: if the MEWS score is Yellow or Red  Were vital signs taken at a resting state? Yes  Focused Assessment No change from prior assessment  Early Detection of Sepsis Score *See Row Information* Medium  MEWS guidelines implemented *See Row Information* Yes  Escalate  MEWS: Escalate Yellow: discuss with charge nurse/RN and consider discussing with provider and RRT  Notify: Charge Nurse/RN  Name of Charge Nurse/RN Notified Wilder Glade RN  Date Charge Nurse/RN Notified 06/11/20  Time Charge Nurse/RN Notified 1033  Notify: Provider  Provider Name/Title Thmopson, MD  Date Provider Notified 06/11/20  Time Provider Notified 1030  Notification Type Page  Notification Reason Change in status  Response No new orders  Date of Provider Response 06/11/20  Time of Provider Response 1049  Notify: Rapid Response  Name of Rapid Response RN Notified Serenity RN/ Sarah RN  Date Rapid Response Notified 06/11/20  Time Rapid Response Notified 1042

## 2020-06-11 NOTE — Significant Event (Signed)
Rapid Response Event Note   Reason for Call :  Hypotension, possibly orthostatic?, sudden onset weakness Patient got suddenly dizzy and weak while trying to walk with assistance to bathroom. Directed into chair by 1A staff and had bowel movement that was very loose in chair. BP had SBP in 60s while in the chair.  Initial Focused Assessment:  Rapid response RN arrived in patient's room with patient in the chair next to the bed. Patient alert and oriented.   Interventions:  Patient helped back to bed by SWOT RN and 1C staff. Primary nurse already in communication with patient's MD. Once back to bed patient no longer felt dizzy and BP was 97/72 with MAP 78.  Plan of Care:  Patient to remain on 1A for now. MD reviewing patient's chart and will assess needs for adjustments to fluid orders. Plan for patient to not get up now until more stable BPs.  Event Summary:   MD Notified: 10:32 Call Time: 10:32 Arrival Time: 10:34 End Time: 10:45  Maylon Sailors, Jaynie Bream, RN

## 2020-06-12 DIAGNOSIS — B9620 Unspecified Escherichia coli [E. coli] as the cause of diseases classified elsewhere: Secondary | ICD-10-CM | POA: Diagnosis present

## 2020-06-12 DIAGNOSIS — N390 Urinary tract infection, site not specified: Secondary | ICD-10-CM

## 2020-06-12 DIAGNOSIS — A419 Sepsis, unspecified organism: Secondary | ICD-10-CM

## 2020-06-12 DIAGNOSIS — B9689 Other specified bacterial agents as the cause of diseases classified elsewhere: Secondary | ICD-10-CM

## 2020-06-12 LAB — URINE CULTURE: Culture: >=100000 — AB

## 2020-06-12 LAB — GLUCOSE, CAPILLARY
Glucose-Capillary: 83 mg/dL (ref 70–99)
Glucose-Capillary: 124 mg/dL — ABNORMAL HIGH (ref 70–99)
Glucose-Capillary: 157 mg/dL — ABNORMAL HIGH (ref 70–99)
Glucose-Capillary: 164 mg/dL — ABNORMAL HIGH (ref 70–99)

## 2020-06-12 LAB — CBC WITH DIFFERENTIAL/PLATELET
Abs Immature Granulocytes: 0.02 10*3/uL (ref 0.00–0.07)
Basophils Absolute: 0.1 10*3/uL (ref 0.0–0.1)
Basophils Relative: 1 %
Eosinophils Absolute: 0.2 10*3/uL (ref 0.0–0.5)
Eosinophils Relative: 3 %
HCT: 24.6 % — ABNORMAL LOW (ref 39.0–52.0)
Hemoglobin: 8.8 g/dL — ABNORMAL LOW (ref 13.0–17.0)
Immature Granulocytes: 0 %
Lymphocytes Relative: 36 %
Lymphs Abs: 2.1 10*3/uL (ref 0.7–4.0)
MCH: 30.3 pg (ref 26.0–34.0)
MCHC: 35.8 g/dL (ref 30.0–36.0)
MCV: 84.8 fL (ref 80.0–100.0)
Monocytes Absolute: 0.3 10*3/uL (ref 0.1–1.0)
Monocytes Relative: 5 %
Neutro Abs: 3.2 10*3/uL (ref 1.7–7.7)
Neutrophils Relative %: 55 %
Platelets: 207 10*3/uL (ref 150–400)
RBC: 2.9 MIL/uL — ABNORMAL LOW (ref 4.22–5.81)
RDW: 15.7 % — ABNORMAL HIGH (ref 11.5–15.5)
WBC: 5.8 10*3/uL (ref 4.0–10.5)
nRBC: 0 % (ref 0.0–0.2)

## 2020-06-12 LAB — RENAL FUNCTION PANEL
Albumin: 2.6 g/dL — ABNORMAL LOW (ref 3.5–5.0)
Anion gap: 7 (ref 5–15)
BUN: 29 mg/dL — ABNORMAL HIGH (ref 6–20)
CO2: 25 mmol/L (ref 22–32)
Calcium: 8.4 mg/dL — ABNORMAL LOW (ref 8.9–10.3)
Chloride: 100 mmol/L (ref 98–111)
Creatinine, Ser: 2.16 mg/dL — ABNORMAL HIGH (ref 0.61–1.24)
GFR calc Af Amer: 41 mL/min — ABNORMAL LOW (ref >60)
GFR calc non Af Amer: 36 mL/min — ABNORMAL LOW (ref >60)
Glucose, Bld: 71 mg/dL (ref 70–99)
Phosphorus: 2.2 mg/dL — ABNORMAL LOW (ref 2.5–4.6)
Potassium: 3.9 mmol/L (ref 3.5–5.1)
Sodium: 132 mmol/L — ABNORMAL LOW (ref 135–145)

## 2020-06-12 LAB — CULTURE, BLOOD (ROUTINE X 2): Special Requests: ADEQUATE

## 2020-06-12 LAB — CORTISOL: Cortisol, Plasma: 9.1 ug/dL

## 2020-06-12 LAB — TSH: TSH: 1.462 u[IU]/mL (ref 0.350–4.500)

## 2020-06-12 LAB — MAGNESIUM: Magnesium: 1.8 mg/dL (ref 1.7–2.4)

## 2020-06-12 MED ORDER — SODIUM PHOSPHATES 45 MMOLE/15ML IV SOLN
8.0000 mmol | Freq: Once | INTRAVENOUS | Status: AC
  Administered 2020-06-12: 8 mmol via INTRAVENOUS
  Filled 2020-06-12: qty 2.67

## 2020-06-12 MED ORDER — MAGNESIUM SULFATE 2 GM/50ML IV SOLN
2.0000 g | Freq: Once | INTRAVENOUS | Status: AC
  Administered 2020-06-12: 2 g via INTRAVENOUS
  Filled 2020-06-12: qty 50

## 2020-06-12 NOTE — Progress Notes (Signed)
PROGRESS NOTE    Gregory Moody  LKG:[id number] DOB: Jul 31, 1976 DOA: 06/09/2020 PCP: [facility]   Chief Complaint  Patient presents with  . Weakness    Brief Narrative:  HPI per Dr. Jimmye Norman 44 y/o M w/ PMH of DM2, chronic urinary retention s/p suprapubic catheter, possible depression who presents w/ weakness & nausea x 1 week. Pt is a poor historian. Pt c/o difficulty walking & no appetite for 1 week. Pt does use a walker. Pt denies any sick contacts. Pt denies getting COVID19 vaccine. Also, pt c/o diarrhea daily x 2-3 years. Pt has approx 2-3 episodes of diarrhea daily. Pt denies any bright red blood or black stools. Of note, pt is allergic to milk. Pt denies any fevers, chills, sweating, cough, chest pain, shortness of breath, abd pain, or constipation. In the ED patient noted to be hypothermic and hypotensive and placed on IV fluids, patient pancultured.  Patient did receive a dose of IV antibiotics in the ED.   Assessment & Plan:   Principal Problem:   Sepsis secondary to UTI [facility]) Active Problems:   E-coli UTI   UTI due to Klebsiella species   GERD (gastroesophageal reflux disease)   Hypotension   Anemia of chronic disease   Protein-calorie malnutrition, severe   Diarrhea   Urinary retention   Hyperglycemia due to type 2 diabetes mellitus (HCC)   Generalized weakness   Encephalopathy   Hypothermia   Hyponatremia  1 sepsis secondary to E. coli and Klebsiella pneumonia , POA Patient had presented with generalized weakness noted to be hypothermic and hypotensive on admission.  Blood pressure on presentation responded to IV fluids.  Patient pancultured.  Chest x-ray done negative for any acute abnormalities.  Patient with chronic suprapubic catheter.  Urine cultures positive for E. coli and Klebsiella pneumoniae.  Blood pressure improving with hydration.  Procalcitonin < 0.1.  UDS negative.  Alcohol level was less than 10.  CT head which was done  showed no acute intracranial findings but atrophy present and advanced for age.  Patient with systolic blood pressures in the 90s.  Was supposed to be on IV fluids however not on fluids.  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  TSH within normal limits.  Continue IV Rocephin for another 24 hours and then subsequently narrowed down to IV cefazolin.  Discontinue Diflucan.  Case discussed with urology PA and IR contacted to replace suprapubic catheter today.  Supportive care.   2.  Generalized weakness Questionable etiology.  Likely secondary to urosepsis.  Could be secondary to patient's hypothermia, hypotension in the setting of poor oral intake.  Patient pancultured.  TSH within normal limits.  Random cortisol was 9.1.  Urine cultures positive for E. coli and Klebsiella pneumonia.  IV fluids.  PT/OT.  Continue IV antibiotics.  Follow.   3.  Metabolic acidosis Resolved.  Home regimen bicarb tablets resumed.  4.  Poorly controlled diabetes mellitus type 2 Hemoglobin A1c 8.3(05/02/2020).  CBG of eighty-three this morning.  Continue current dose of Lantus 5 units daily.  Sliding scale insulin.  Follow.  5.  Chronic urinary retention status post suprapubic catheter Catheter last changed 06/06/2020 per patient however per urology PA patient missed appointment and suprapubic catheter has not been changed and is due to be changed tomorrow in the outpatient setting.  Urinalysis on admission was turbid, large leukocytes, nitrite negative, greater than 50 WBCs, many bacteria, budding yeast present.  Urine cultures with greater than one hundred thousand colonies of E.  coli and Klebsiella pneumoniae.  Some budding yeast also noted in the urine.  Urine cultures did not grow any yeast.  Patient started on Diflucan empirically pending finalization of results and as such we will discontinue Diflucan.  Continue IV Rocephin for another 24 hours and subsequently narrowed down to IV cefazolin tomorrow.  Follow.    6.   Hyponatremia Likely secondary to hypovolemic hyponatremia.  Improved with hydration.  TSH within normal limits at 1.462.  Random cortisol level at 9.1.  Continue IV fluids.  Supportive care.    7.  Chronic diarrhea Stool cultures ordered and currently pending.  Outpatient follow-up.  8.  Severe protein calorie malnutrition Nutrition consulted.  Continue nutritional supplementation.   9.  chronic kidney disease stage IIIb Renal function seems to be close to baseline.  Creatinine down to 2.16.  Continue home regimen bicarb tablets.  Continue hydration with IV fluids.  Follow.    10.  Gastroesophageal reflux disease PPI.  11.  Depression Stable.  Continue home regimen Cymbalta, Cogentin, Haldol.  12.  Anemia of chronic disease Hemoglobin currently stable at 8.8.  Continue oral iron supplementation.    DVT prophylaxis: Lovenox Code Status: Full Family Communication: Updated patient.  No family at bedside. Disposition:   Status is: Inpatient    Dispo: The patient is from: Home              Anticipated d/c is to: Home with home health services               Anticipated d/c date is: To be determined.              Patient currently with sepsis secondary to UTI with ongoing hypotension slowly responding to IV fluids, generalized weakness.  Not stable for discharge.         Consultants:   None  Procedures:   CT head 06/09/2020  CT abdomen and pelvis 06/09/2020  Chest x-ray 06/09/2020    Antimicrobials:  IV Rocephin 06/11/2020  Diflucan 06/11/2020>>>>>    Subjective: Some improvement with nausea.  Denies chest pain or shortness of breath.  Still weak but slowly improving.  Feeling better today than he did yesterday.  Blood pressure improving with hydration.   Objective: Vitals:   06/11/20 1825 06/11/20 2228 06/12/20 0232 06/12/20 0757  BP: (!) 117/92 105/79 (!) 137/99 90/70  Pulse: 94 82 72 71  Resp: 17 16 18 15   Temp: 98.6 F (37 C) 98 F (36.7 C) 98.6 F (37  C) 98 F (36.7 C)  TempSrc: Oral Oral  Oral  SpO2: 100% 100% 100% 100%  Weight:      Height:        Intake/Output Summary (Last 24 hours) at 06/12/2020 1241 Last data filed at 06/12/2020 0900 Gross per 24 hour  Intake 720 ml  Output 1550 ml  Net -830 ml   Filed Weights   06/09/20 1251  Weight: 45.4 kg    Examination:  General exam: Cachectic.  Frail.  Emaciated. Respiratory system: CTAB.  No wheezes, no crackles, no rhonchi.  Normal respiratory effort. Cardiovascular system: Regular rate rhythm no murmurs rubs or gallops.  No JVD.  No lower extremity edema.   Gastrointestinal system: Abdomen is soft, nontender, nondistended, positive bowel sounds.  No rebound.  No guarding.  Gu: Suprapubic catheter in place Central nervous system: Alert and oriented. No focal neurological deficits. Extremities: Symmetric 5 x 5 power. Skin: No rashes, lesions or ulcers Psychiatry: Judgement and insight appear normal.  Mood & affect appropriate.     Data Reviewed: I have personally reviewed following labs and imaging studies  CBC: Recent Labs  Lab 06/09/20 1301 06/10/20 0357 06/11/20 0518 06/12/20 0352  WBC 6.7 4.9 5.5 5.8  NEUTROABS  --   --   --  3.2  HGB 9.8* 8.7* 9.1* 8.8*  HCT 28.0* 24.2* 26.0* 24.6*  MCV 86.4 85.8 86.4 84.8  PLT 280 231 227 765    Basic Metabolic Panel: Recent Labs  Lab 06/09/20 1320 06/09/20 2357 06/10/20 0357 06/10/20 0804 06/10/20 1114 06/11/20 0518 06/12/20 0352  NA 128*   < > 130* 129* 129* 128* 132*  K 4.1   < > 3.6 3.6 3.7 3.7 3.9  CL 105   < > 99 100 100 97* 100  CO2 13*   < > 24 22 22 23 25   GLUCOSE 273*   < > 252* 221* 179* 170* 71  BUN 38*   < > 40* 39* 37* 31* 29*  CREATININE 2.32*   < > 2.31* 2.22* 2.29* 2.30* 2.16*  CALCIUM 7.8*   < > 8.3* 8.3* 8.2* 8.7* 8.4*  MG 2.2  --   --   --   --   --  1.8  PHOS  --   --   --   --   --   --  2.2*   < > = values in this interval not displayed.    GFR: Estimated Creatinine Clearance: 27.7  mL/min (A) (by C-G formula based on SCr of 2.16 mg/dL (H)).  Liver Function Tests: Recent Labs  Lab 06/09/20 1301 06/12/20 0352  AST 10*  --   ALT 8  --   ALKPHOS 56  --   BILITOT 1.2  --   PROT 7.0  --   ALBUMIN 3.3* 2.6*    CBG: Recent Labs  Lab 06/11/20 1150 06/11/20 1823 06/11/20 2054 06/12/20 0757 06/12/20 1128  GLUCAP 157* 204* 92 83 124*     Recent Results (from the past 240 hour(s))  Blood Culture (routine x 2)     Status: Abnormal   Collection Time: 06/09/20  1:20 PM   Specimen: BLOOD  Result Value Ref Range Status   Specimen Description   Final    BLOOD RIGHT ANTECUBITAL Performed at [facility], [address] [place], [place] [address]    Special Requests   Final    BOTTLES DRAWN AEROBIC AND ANAEROBIC Blood Culture adequate volume Performed at [facility], [address], [place], [place] [address]    Culture  Setup Time   Final    AEROBIC BOTTLE ONLY GRAM POSITIVE COCCI Organism ID to follow CRITICAL RESULT CALLED TO, READ BACK BY AND VERIFIED WITH: [place] 06/10/20 AT 1450 BY ACR GRAM STAIN REVIEWED-AGREE WITH RESULT    Culture (A)  Final    STAPHYLOCOCCUS CAPITIS THE SIGNIFICANCE OF ISOLATING THIS ORGANISM FROM A SINGLE SET OF BLOOD CULTURES WHEN MULTIPLE SETS ARE DRAWN IS UNCERTAIN. PLEASE NOTIFY THE MICROBIOLOGY DEPARTMENT WITHIN ONE WEEK IF SPECIATION AND SENSITIVITIES ARE REQUIRED. Performed at [facility], [place] [address] [place],  [address]    Report Status 06/12/2020 FINAL  Final  Blood Culture (routine x 2)     Status: None (Preliminary result)   Collection Time: 06/09/20  1:20 PM   Specimen: BLOOD  Result Value Ref Range Status   Specimen Description BLOOD BLOOD RIGHT HAND  Final   Special Requests   Final  BOTTLES DRAWN AEROBIC AND ANAEROBIC Blood Culture adequate volume   Culture   Final    NO GROWTH 3 DAYS Performed at [facility], [address] [place], [place]  [address]    Report Status PENDING  Incomplete  Urine culture     Status: Abnormal   Collection Time: 06/09/20  1:20 PM   Specimen: In/Out Cath Urine  Result Value Ref Range Status   Specimen Description   Final    IN/OUT CATH URINE Performed at [facility], [address] [place], [place] [address]    Special Requests   Final    NONE Performed at [facility], [address] [place], [place] [address]    Culture (A)  Final    >=100,000 COLONIES/mL ESCHERICHIA COLI >=100,000 COLONIES/mL KLEBSIELLA PNEUMONIAE    Report Status 06/12/2020 FINAL  Final   Organism ID, Bacteria ESCHERICHIA COLI (A)  Final   Organism ID, Bacteria KLEBSIELLA PNEUMONIAE (A)  Final      Susceptibility   Escherichia coli - MIC*    AMPICILLIN >=32 RESISTANT Resistant     CEFAZOLIN <=4 SENSITIVE Sensitive     CEFTRIAXONE <=0.25 SENSITIVE Sensitive     CIPROFLOXACIN 2 INTERMEDIATE Intermediate     GENTAMICIN <=1 SENSITIVE Sensitive     IMIPENEM <=0.25 SENSITIVE Sensitive     NITROFURANTOIN <=16 SENSITIVE Sensitive     TRIMETH/SULFA <=20 SENSITIVE Sensitive     AMPICILLIN/SULBACTAM 4 SENSITIVE Sensitive     PIP/TAZO <=4 SENSITIVE Sensitive     * >=100,000 COLONIES/mL ESCHERICHIA COLI   Klebsiella pneumoniae - MIC*    AMPICILLIN >=32 RESISTANT Resistant     CEFAZOLIN <=4 SENSITIVE Sensitive     CEFTRIAXONE <=0.25 SENSITIVE Sensitive     CIPROFLOXACIN <=0.25 SENSITIVE Sensitive     GENTAMICIN <=1 SENSITIVE Sensitive     IMIPENEM <=0.25 SENSITIVE Sensitive     NITROFURANTOIN 64 INTERMEDIATE Intermediate     TRIMETH/SULFA <=20 SENSITIVE Sensitive     AMPICILLIN/SULBACTAM 8 SENSITIVE Sensitive     PIP/TAZO <=4 SENSITIVE Sensitive     * >=100,000 COLONIES/mL KLEBSIELLA PNEUMONIAE  SARS Coronavirus 2 by RT PCR (hospital order, performed in [place] hospital lab) Nasopharyngeal Nasopharyngeal Swab     Status: None   Collection Time: 06/09/20  1:20 PM   Specimen: Nasopharyngeal Swab   Result Value Ref Range Status   SARS Coronavirus 2 NEGATIVE NEGATIVE Final    Comment: (NOTE) SARS-CoV-2 target nucleic acids are NOT DETECTED.  The SARS-CoV-2 RNA is generally detectable in upper and lower respiratory specimens during the acute phase of infection. The lowest concentration of SARS-CoV-2 viral copies this assay can detect is 250 copies / mL. A negative result does not preclude SARS-CoV-2 infection and should not be used as the sole basis for treatment or other patient management decisions.  A negative result may occur with improper specimen collection / handling, submission of specimen other than nasopharyngeal swab, presence of viral mutation(s) within the areas targeted by this assay, and inadequate number of viral copies (<250 copies / mL). A negative result must be combined with clinical observations, patient history, and epidemiological information.  Fact Sheet for Patients:   StrictlyIdeas.no  Fact Sheet for Healthcare Providers: BankingDealers.co.za  This test is not yet approved or  cleared by the [place] FDA and has been authorized for detection and/or diagnosis of SARS-CoV-2 by FDA under an Emergency Use Authorization (EUA).  This EUA will remain in effect (meaning this test can be used)  for the duration of the COVID-19 declaration under Section 564(b)(1) of the Act, 21 U.S.C. section 360bbb-3(b)(1), unless the authorization is terminated or revoked sooner.  Performed at [facility], [address] [place], [place] [address]   Blood Culture ID Panel (Reflexed)     Status: Abnormal   Collection Time: 06/09/20  1:20 PM  Result Value Ref Range Status   Enterococcus faecalis NOT DETECTED NOT DETECTED Final   Enterococcus Faecium NOT DETECTED NOT DETECTED Final   Listeria monocytogenes NOT DETECTED NOT DETECTED Final   Staphylococcus species DETECTED (A) NOT DETECTED Final    Comment:  CRITICAL RESULT CALLED TO, READ BACK BY AND VERIFIED WITH: MYRA SLAUGHTER 06/10/20 AT 1450 BY AR    Staphylococcus aureus (BCID) NOT DETECTED NOT DETECTED Final   Staphylococcus epidermidis NOT DETECTED NOT DETECTED Final   Staphylococcus lugdunensis NOT DETECTED NOT DETECTED Final   Streptococcus species NOT DETECTED NOT DETECTED Final   Streptococcus agalactiae NOT DETECTED NOT DETECTED Final   Streptococcus pneumoniae NOT DETECTED NOT DETECTED Final   Streptococcus pyogenes NOT DETECTED NOT DETECTED Final   A.calcoaceticus-baumannii NOT DETECTED NOT DETECTED Final   Bacteroides fragilis NOT DETECTED NOT DETECTED Final   Enterobacterales NOT DETECTED NOT DETECTED Final   Enterobacter cloacae complex NOT DETECTED NOT DETECTED Final   Escherichia coli NOT DETECTED NOT DETECTED Final   Klebsiella aerogenes NOT DETECTED NOT DETECTED Final   Klebsiella oxytoca NOT DETECTED NOT DETECTED Final   Klebsiella pneumoniae NOT DETECTED NOT DETECTED Final   Proteus species NOT DETECTED NOT DETECTED Final   Salmonella species NOT DETECTED NOT DETECTED Final   Serratia marcescens NOT DETECTED NOT DETECTED Final   Haemophilus influenzae NOT DETECTED NOT DETECTED Final   Neisseria meningitidis NOT DETECTED NOT DETECTED Final   Pseudomonas aeruginosa NOT DETECTED NOT DETECTED Final   Stenotrophomonas maltophilia NOT DETECTED NOT DETECTED Final   Candida albicans NOT DETECTED NOT DETECTED Final   Candida auris NOT DETECTED NOT DETECTED Final   Candida glabrata NOT DETECTED NOT DETECTED Final   Candida krusei NOT DETECTED NOT DETECTED Final   Candida parapsilosis NOT DETECTED NOT DETECTED Final   Candida tropicalis NOT DETECTED NOT DETECTED Final   Cryptococcus neoformans/gattii NOT DETECTED NOT DETECTED Final    Comment: Performed at [facility], [address] [place], [place] [address]  CULTURE, BLOOD (ROUTINE X 2) w Reflex to ID Panel     Status: None (Preliminary result)   Collection  Time: 06/10/20  5:35 PM   Specimen: BLOOD  Result Value Ref Range Status   Specimen Description BLOOD LEFT ANTECUBITAL  Final   Special Requests   Final    BOTTLES DRAWN AEROBIC AND ANAEROBIC Blood Culture adequate volume   Culture   Final    NO GROWTH 2 DAYS Performed at [facility], [address] [place], [place] [address]    Report Status PENDING  Incomplete  CULTURE, BLOOD (ROUTINE X 2) w Reflex to ID Panel     Status: None (Preliminary result)   Collection Time: 06/10/20  5:41 PM   Specimen: BLOOD  Result Value Ref Range Status   Specimen Description BLOOD BLOOD LEFT HAND  Final   Special Requests   Final    BOTTLES DRAWN AEROBIC ONLY Blood Culture adequate volume   Culture   Final    NO GROWTH 2 DAYS Performed at [facility], [address], [place], [place] [address]    Report Status PENDING  Incomplete  Radiology Studies: No results found.      Scheduled Meds: . benztropine  0.5 mg Oral BID  . calcium carbonate  1 tablet Oral TID WC  . dronabinol  2.5 mg Oral BID AC  . DULoxetine  30 mg Oral Daily  . enoxaparin (LOVENOX) injection  30 mg Subcutaneous Q24H  . feeding supplement (ENSURE ENLIVE)  237 mL Oral BID BM  . feeding supplement (GLUCERNA SHAKE)  237 mL Oral BID BM  . feeding supplement (GLUCERNA SHAKE)  237 mL Oral TID BM  . ferrous sulfate  325 mg Oral BID WC  . fluconazole  100 mg Oral Daily  . haloperidol  0.5 mg Oral BID  . insulin aspart  0-9 Units Subcutaneous TID WC  . insulin glargine  5 Units Subcutaneous QHS  . multivitamin with minerals  1 tablet Oral Daily  . pantoprazole  40 mg Oral Daily  . Ensure Max Protein  11 oz Oral Daily  . scopolamine  1 patch Transdermal Q72H  . sodium bicarbonate  1,300 mg Oral BID  . sodium chloride flush  3 mL Intravenous Q12H   Continuous Infusions: . sodium chloride 100 mL/hr at 06/11/20 1212  . cefTRIAXone (ROCEPHIN)  IV 1 g (06/11/20 1816)     LOS: 2 days    Time  spent: 40 minutes    [name], MD [facility]   To contact the attending provider between 7A-7P or the covering provider during after hours 7P-7A, please log into the web site [email or web address] and access using universal [place] password for that web site. If you do not have the password, please call the hospital operator.  06/12/2020, 12:41 PM

## 2020-06-12 NOTE — Progress Notes (Signed)
PT Cancellation Note  Patient Details Name: Gregory Moody MRN: [id number] DOB: Dec 04, 1975   Cancelled Treatment:    Reason Eval/Treat Not Completed: Other (comment) Pt lying in bed upon arrival to room. Spanish interpreter [name] [phone] on the phone to interpret conversation. Pt states that he is about to eat and does not want to eat cold food and is requesting that clinician return at a later time. Attempted to have pt sit up in recliner chair to eat lunch and he refused stating that he was all set up in bed. Pt notified that clinician will make attempt at a later time, if time permits today, or tomorrow. Pt agreed.  [name] 06/12/2020, 3:20 PM

## 2020-06-13 ENCOUNTER — Ambulatory Visit
Admission: RE | Admit: 2020-06-13 | Discharge: 2020-06-13 | Disposition: A | Discharge status: Home / Self Care | Payer: Self-pay | Source: Ambulatory Visit | Attending: Physician Assistant | Admitting: Physician Assistant

## 2020-06-13 DIAGNOSIS — R339 Retention of urine, unspecified: Secondary | ICD-10-CM | POA: Insufficient documentation

## 2020-06-13 DIAGNOSIS — Z435 Encounter for attention to cystostomy: Secondary | ICD-10-CM | POA: Insufficient documentation

## 2020-06-13 DIAGNOSIS — N319 Neuromuscular dysfunction of bladder, unspecified: Secondary | ICD-10-CM | POA: Insufficient documentation

## 2020-06-13 HISTORY — PX: IR CATHETER TUBE CHANGE: IMG717

## 2020-06-13 LAB — GLUCOSE, CAPILLARY
Glucose-Capillary: 178 mg/dL — ABNORMAL HIGH (ref 70–99)
Glucose-Capillary: 156 mg/dL — ABNORMAL HIGH (ref 70–99)
Glucose-Capillary: 169 mg/dL — ABNORMAL HIGH (ref 70–99)
Glucose-Capillary: 162 mg/dL — ABNORMAL HIGH (ref 70–99)

## 2020-06-13 LAB — CBC WITH DIFFERENTIAL/PLATELET
Abs Immature Granulocytes: 0.01 10*3/uL (ref 0.00–0.07)
Basophils Absolute: 0 10*3/uL (ref 0.0–0.1)
Basophils Relative: 1 %
Eosinophils Absolute: 0.2 10*3/uL (ref 0.0–0.5)
Eosinophils Relative: 4 %
HCT: 23.9 % — ABNORMAL LOW (ref 39.0–52.0)
Hemoglobin: 7.9 g/dL — ABNORMAL LOW (ref 13.0–17.0)
Immature Granulocytes: 0 %
Lymphocytes Relative: 38 %
Lymphs Abs: 2.1 10*3/uL (ref 0.7–4.0)
MCH: 30 pg (ref 26.0–34.0)
MCHC: 33.1 g/dL (ref 30.0–36.0)
MCV: 90.9 fL (ref 80.0–100.0)
Monocytes Absolute: 0.2 10*3/uL (ref 0.1–1.0)
Monocytes Relative: 4 %
Neutro Abs: 3 10*3/uL (ref 1.7–7.7)
Neutrophils Relative %: 53 %
Platelets: 215 10*3/uL (ref 150–400)
RBC: 2.63 MIL/uL — ABNORMAL LOW (ref 4.22–5.81)
RDW: 15.8 % — ABNORMAL HIGH (ref 11.5–15.5)
WBC: 5.6 10*3/uL (ref 4.0–10.5)
nRBC: 0 % (ref 0.0–0.2)

## 2020-06-13 LAB — FOLATE: Folate: 8.3 ng/mL (ref >5.9)

## 2020-06-13 LAB — FERRITIN: Ferritin: 106 ng/mL (ref 24–336)

## 2020-06-13 LAB — RENAL FUNCTION PANEL
Albumin: 2.5 g/dL — ABNORMAL LOW (ref 3.5–5.0)
Anion gap: 3 — ABNORMAL LOW (ref 5–15)
BUN: 26 mg/dL — ABNORMAL HIGH (ref 6–20)
CO2: 25 mmol/L (ref 22–32)
Calcium: 8.1 mg/dL — ABNORMAL LOW (ref 8.9–10.3)
Chloride: 103 mmol/L (ref 98–111)
Creatinine, Ser: 2.12 mg/dL — ABNORMAL HIGH (ref 0.61–1.24)
GFR calc Af Amer: 42 mL/min — ABNORMAL LOW (ref >60)
GFR calc non Af Amer: 36 mL/min — ABNORMAL LOW (ref >60)
Glucose, Bld: 170 mg/dL — ABNORMAL HIGH (ref 70–99)
Phosphorus: 2.6 mg/dL (ref 2.5–4.6)
Potassium: 4.5 mmol/L (ref 3.5–5.1)
Sodium: 131 mmol/L — ABNORMAL LOW (ref 135–145)

## 2020-06-13 LAB — IRON AND TIBC
Iron: 53 ug/dL (ref 45–182)
Saturation Ratios: 27 % (ref 17.9–39.5)
TIBC: 200 ug/dL — ABNORMAL LOW (ref 250–450)
UIBC: 147 ug/dL

## 2020-06-13 LAB — VITAMIN B12: Vitamin B-12: 384 pg/mL (ref 180–914)

## 2020-06-13 LAB — MAGNESIUM: Magnesium: 2.3 mg/dL (ref 1.7–2.4)

## 2020-06-13 MED ORDER — LIDOCAINE HCL (PF) 1 % IJ SOLN
INTRAMUSCULAR | Status: AC
  Filled 2020-06-13: qty 30

## 2020-06-13 MED ORDER — CEFAZOLIN SODIUM-DEXTROSE 1-4 GM/50ML-% IV SOLN
1.0000 g | Freq: Three times a day (TID) | INTRAVENOUS | Status: DC
  Filled 2020-06-13 (×4): qty 50

## 2020-06-13 MED ORDER — CEFAZOLIN SODIUM-DEXTROSE 1-4 GM/50ML-% IV SOLN
1.0000 g | Freq: Two times a day (BID) | INTRAVENOUS | Status: DC
  Administered 2020-06-13 – 2020-06-15 (×5): 1 g via INTRAVENOUS
  Filled 2020-06-13 (×8): qty 50

## 2020-06-13 NOTE — Procedures (Signed)
Interventional Radiology Procedure Note  Procedure: Suprapubic catheter exchange under fluoro  Complications: None  Estimated Blood Loss: None  Findings: 16 Fr pigtail drain exchanged for a 16 Fr Council-tip balloon retention catheter. Retention balloon inflated with 9 mL of saline. Catheter attached to gravity bag.  Venetia Night. Kathlene Cote, M.D Pager:  [phone]

## 2020-06-13 NOTE — Progress Notes (Signed)
Patient is scheduled for outpatient SPT change with IR today after having missed his scheduled appointment last week. I spoke with IR this morning to request inpatient exchange given his current admission; they will plan to add him on likely later today.

## 2020-06-13 NOTE — Progress Notes (Signed)
Patient assessed in IR for SPT exchange. Assistance provided by Arabi language interpreter.  Currently having BM with liquid stool vs. Urine output.  Patient states he has both stool and urine output but continues to have retention.  Admitted with urine cultures positive for E coli and Klebsiella.   IR consulted for SPT exchange.   Consent obtained.  Proceed with exchange today.   Brynda Greathouse, MS RD PA-C 9:50 AM

## 2020-06-13 NOTE — Progress Notes (Signed)
OT Cancellation Note  Patient Details Name: Gregory Moody MRN: [id number] DOB: [birth year]   Cancelled Treatment:    Reason Eval/Treat Not Completed: Patient declined, no reason specified. Upon attempt, Spanish interpreter on the phone [place], [phone]). Pt seated on BSC. Pt reports he is not yet done and requests additional time. Will re-attempt OT tx at later date/time as pt is available and agreeable to therapy.  Jeni Salles, MPH, MS, OTR/L ascom [phone] 06/13/20, 1:50 PM

## 2020-06-13 NOTE — Progress Notes (Signed)
PHARMACY NOTE:  ANTIMICROBIAL RENAL DOSAGE ADJUSTMENT  Current antimicrobial regimen includes a mismatch between antimicrobial dosage and estimated renal function.  As per policy approved by the Pharmacy & Therapeutics and Medical Executive Committees, the antimicrobial dosage will be adjusted accordingly.  Current antimicrobial dosage:  Cefazolin 1gm IV q8h  Indication: UTI  Renal Function:  Estimated Creatinine Clearance: 28.3 mL/min (A) (by C-G formula based on SCr of 2.12 mg/dL (H)). []      On intermittent HD, scheduled: []      On CRRT    Antimicrobial dosage has been changed to:  Cefazolin 1gm IV q12h  Additional comments:   Thank you for allowing pharmacy to be a part of this patient's care.  Doreene Eland, PharmD, BCPS.   Work Cell: [phone] 06/13/2020 10:00 AM

## 2020-06-13 NOTE — Progress Notes (Signed)
PROGRESS NOTE    Gregory Moody  EXH:[id number] DOB: 09/08/1976 DOA: 06/09/2020 PCP: [place]   Chief Complaint  Patient presents with   Weakness    Brief Narrative:  HPI per Dr. Jimmye Norman 44 y/o M w/ PMH of DM2, chronic urinary retention s/p suprapubic catheter, possible depression who presents w/ weakness & nausea x 1 week. Pt is a poor historian. Pt c/o difficulty walking & no appetite for 1 week. Pt does use a walker. Pt denies any sick contacts. Pt denies getting COVID19 vaccine. Also, pt c/o diarrhea daily x 2-3 years. Pt has approx 2-3 episodes of diarrhea daily. Pt denies any bright red blood or black stools. Of note, pt is allergic to milk. Pt denies any fevers, chills, sweating, cough, chest pain, shortness of breath, abd pain, or constipation. In the ED patient noted to be hypothermic and hypotensive and placed on IV fluids, patient pancultured.  Patient did receive a dose of IV antibiotics in the ED.   Assessment & Plan:   Principal Problem:   Sepsis secondary to UTI [facility]) Active Problems:   E-coli UTI   UTI due to Klebsiella species   GERD (gastroesophageal reflux disease)   Hypotension   Anemia of chronic disease   Protein-calorie malnutrition, severe   Diarrhea   Urinary retention   Hyperglycemia due to type 2 diabetes mellitus (HCC)   Generalized weakness   Encephalopathy   Hypothermia   Hyponatremia  1 severe sepsis with acute organ dysfunction (hypotension) secondary to E. coli and Klebsiella pneumonia , POA Patient had presented with generalized weakness noted to be hypothermic and hypotensive on admission.  Blood pressure on presentation responded to IV fluids.  Patient pancultured.  Chest x-ray done negative for any acute abnormalities.  Patient with chronic suprapubic catheter.  Urine cultures positive for E. coli and Klebsiella pneumoniae.  Blood pressure improved with hydration.  Procalcitonin < 0.1.  UDS negative.  Alcohol  level was less than 10.  CT head which was done showed no acute intracranial findings but atrophy present and advanced for age.  Patient with improvement with systolic blood pressure.  TSH within normal limits.  Saline lock IV fluids.  Diflucan has been discontinued.  Change IV Rocephin to IV cefazolin.  Patient status post suprapubic catheter exchange 06/12/2020.  Urology PA following.  Could likely transition to oral antibiotics in the next 24 to 48 hours if continued improvement.  Follow.   2.  Generalized weakness Questionable etiology.  Likely secondary to urosepsis.  Could be secondary to patient's hypothermia, hypotension in the setting of poor oral intake.  Patient pancultured.  TSH within normal limits.  Random cortisol was 9.1.  Urine cultures positive for E. coli and Klebsiella pneumonia.  Clinically improving.  Continue IV antibiotics, PT/OT.  Saline lock IV fluids.  Follow.    3.  Metabolic acidosis Resolved.  Continue home regimen bicarb tablets.   4.  Poorly controlled diabetes mellitus type 2 Hemoglobin A1c 8.3(05/02/2020).  CBG of 178 this morning.  Continue current dose of Lantus 5 units daily.  Sliding scale insulin.  Follow.  5.  Chronic urinary retention status post suprapubic catheter Catheter last changed 06/06/2020 per patient however per urology PA patient missed appointment and suprapubic catheter has not been changed and is due to be changed tomorrow in the outpatient setting.  Urinalysis on admission was turbid, large leukocytes, nitrite negative, greater than 50 WBCs, many bacteria, budding yeast present.  Urine cultures with greater than one hundred  thousand colonies of E. coli and Klebsiella pneumoniae.  Some budding yeast also noted in the urine.  Urine cultures did not grow any yeast.  Patient started on Diflucan empirically initially, however urine cultures finalized and as such Diflucan was discontinued.  Change IV Rocephin to IV Ancef.  Follow.   6.  Hyponatremia Likely  secondary to hypovolemic hyponatremia.  Improved with hydration.  TSH within normal limits at 1.462.  Random cortisol level at 9.1.  Saline lock IV fluids.  Follow.    7.  Chronic diarrhea Stool cultures ordered and currently pending.  Outpatient follow-up.  8.  Severe protein calorie malnutrition Nutrition consulted.  Continue nutritional supplementation.   9.  chronic kidney disease stage IIIb Renal function seems to be close to baseline.  Creatinine down to 2.12.  Continue home regimen bicarb tablets.  Saline lock IV fluids.  Follow.   10.  Gastroesophageal reflux disease PPI.  11.  Depression Continue home regimen of Cogentin, Haldol, Cymbalta.  Follow.  12.  Anemia of chronic disease Hemoglobin currently stable at 7.9.  Likely dilutional effect.  Saline lock IV fluids.  Continue oral iron supplementation.     DVT prophylaxis: Lovenox Code Status: Full Family Communication: Updated patient.  No family at bedside. Disposition:   Status is: Inpatient    Dispo: The patient is from: Home              Anticipated d/c is to: Home with home health services               Anticipated d/c date is: To be determined.              Patient currently with sepsis secondary to UTI with ongoing hypotension slowly responding to IV fluids, generalized weakness.  Not stable for discharge.         Consultants:   Urology  Procedures:   CT head 06/09/2020  CT abdomen and pelvis 06/09/2020  Chest x-ray 06/09/2020    Antimicrobials:  IV Rocephin 06/11/2020>>>> 06/13/2020  Diflucan 06/11/2020>>>>> 06/12/2020   IV cefazolin 06/13/2020>>>>   Subjective: Sitting up in bed.  Feeling better.  Weakness improving.  No nausea or emesis.  No chest pain or shortness of breath.  Objective: Vitals:   06/12/20 0757 06/12/20 1619 06/13/20 0037 06/13/20 0744  BP: 90/70 121/88 (!) 127/92 113/82  Pulse: 71 76 74 80  Resp: 15 16 17 18   Temp: 98 F (36.7 C) 98 F (36.7 C) 98.1 F (36.7 C) 98.6 F  (37 C)  TempSrc: Oral Oral Oral Oral  SpO2: 100% 100% 100% 100%  Weight:      Height:        Intake/Output Summary (Last 24 hours) at 06/13/2020 1130 Last data filed at 06/13/2020 0950 Gross per 24 hour  Intake 580 ml  Output --  Net 580 ml   Filed Weights   06/09/20 1251  Weight: 45.4 kg    Examination:  General exam: Frail.  Emaciated.  Cachectic. Respiratory system: Lungs clear to auscultation bilaterally.  No wheezes, no crackles, no rhonchi.  Normal respiratory effort.  Cardiovascular system: Regular rate rhythm no murmurs rubs or gallops.  No JVD.  No lower extremity edema.   Gastrointestinal system: Abdomen is nontender, nondistended, soft, positive bowel sounds.  No rebound.  No guarding.  Gu: Suprapubic catheter in place Central nervous system: Alert and oriented. No focal neurological deficits. Extremities: Symmetric 5 x 5 power. Skin: No rashes, lesions or ulcers Psychiatry: Judgement and insight  appear normal. Mood & affect appropriate.     Data Reviewed: I have personally reviewed following labs and imaging studies  CBC: Recent Labs  Lab 06/09/20 1301 06/10/20 0357 06/11/20 0518 06/12/20 0352 06/13/20 0340  WBC 6.7 4.9 5.5 5.8 5.6  NEUTROABS  --   --   --  3.2 3.0  HGB 9.8* 8.7* 9.1* 8.8* 7.9*  HCT 28.0* 24.2* 26.0* 24.6* 23.9*  MCV 86.4 85.8 86.4 84.8 90.9  PLT 280 231 227 207 939    Basic Metabolic Panel: Recent Labs  Lab 06/09/20 1320 06/09/20 2357 06/10/20 0804 06/10/20 1114 06/11/20 0518 06/12/20 0352 06/13/20 0340  NA 128*   < > 129* 129* 128* 132* 131*  K 4.1   < > 3.6 3.7 3.7 3.9 4.5  CL 105   < > 100 100 97* 100 103  CO2 13*   < > 22 22 23 25 25   GLUCOSE 273*   < > 221* 179* 170* 71 170*  BUN 38*   < > 39* 37* 31* 29* 26*  CREATININE 2.32*   < > 2.22* 2.29* 2.30* 2.16* 2.12*  CALCIUM 7.8*   < > 8.3* 8.2* 8.7* 8.4* 8.1*  MG 2.2  --   --   --   --  1.8 2.3  PHOS  --   --   --   --   --  2.2* 2.6   < > = values in this interval  not displayed.    GFR: Estimated Creatinine Clearance: 28.3 mL/min (A) (by C-G formula based on SCr of 2.12 mg/dL (H)).  Liver Function Tests: Recent Labs  Lab 06/09/20 1301 06/12/20 0352 06/13/20 0340  AST 10*  --   --   ALT 8  --   --   ALKPHOS 56  --   --   BILITOT 1.2  --   --   PROT 7.0  --   --   ALBUMIN 3.3* 2.6* 2.5*    CBG: Recent Labs  Lab 06/12/20 1128 06/12/20 1622 06/12/20 2126 06/13/20 0745 06/13/20 1126  GLUCAP 124* 157* 164* 178* 156*     Recent Results (from the past 240 hour(s))  Blood Culture (routine x 2)     Status: Abnormal   Collection Time: 06/09/20  1:20 PM   Specimen: BLOOD  Result Value Ref Range Status   Specimen Description   Final    BLOOD RIGHT ANTECUBITAL Performed at [facility], [address] [place], [place] [address]    Special Requests   Final    BOTTLES DRAWN AEROBIC AND ANAEROBIC Blood Culture adequate volume Performed at [facility], [address], [place], [place] [address]    Culture  Setup Time   Final    AEROBIC BOTTLE ONLY GRAM POSITIVE COCCI Organism ID to follow CRITICAL RESULT CALLED TO, READ BACK BY AND VERIFIED WITH: MYRA SLAUGHTER 06/10/20 AT 1450 BY ACR GRAM STAIN REVIEWED-AGREE WITH RESULT    Culture (A)  Final    STAPHYLOCOCCUS CAPITIS THE SIGNIFICANCE OF ISOLATING THIS ORGANISM FROM A SINGLE SET OF BLOOD CULTURES WHEN MULTIPLE SETS ARE DRAWN IS UNCERTAIN. PLEASE NOTIFY THE MICROBIOLOGY DEPARTMENT WITHIN ONE WEEK IF SPECIATION AND SENSITIVITIES ARE REQUIRED. Performed at [facility], [place] [address] [place], [place] [address]    Report Status 06/12/2020 FINAL  Final  Blood Culture (routine x 2)     Status: None (Preliminary result)   Collection Time: 06/09/20  1:20 PM   Specimen: BLOOD  Result Value Ref Range Status   Specimen Description BLOOD BLOOD RIGHT HAND  Final   Special Requests   Final    BOTTLES DRAWN AEROBIC AND ANAEROBIC Blood Culture adequate volume    Culture   Final    NO GROWTH 4 DAYS Performed at [facility], [address] [place], [place] [address]    Report Status PENDING  Incomplete  Urine culture     Status: Abnormal   Collection Time: 06/09/20  1:20 PM   Specimen: In/Out Cath Urine  Result Value Ref Range Status   Specimen Description   Final    IN/OUT CATH URINE Performed at [facility], [address], [place], [place] [address]    Special Requests   Final    NONE Performed at [facility], [address] [place], [place] [address]    Culture (A)  Final    >=100,000 COLONIES/mL ESCHERICHIA COLI >=100,000 COLONIES/mL KLEBSIELLA PNEUMONIAE    Report Status 06/12/2020 FINAL  Final   Organism ID, Bacteria ESCHERICHIA COLI (A)  Final   Organism ID, Bacteria KLEBSIELLA PNEUMONIAE (A)  Final      Susceptibility   Escherichia coli - MIC*    AMPICILLIN >=32 RESISTANT Resistant     CEFAZOLIN <=4 SENSITIVE Sensitive     CEFTRIAXONE <=0.25 SENSITIVE Sensitive     CIPROFLOXACIN 2 INTERMEDIATE Intermediate     GENTAMICIN <=1 SENSITIVE Sensitive     IMIPENEM <=0.25 SENSITIVE Sensitive     NITROFURANTOIN <=16 SENSITIVE Sensitive     TRIMETH/SULFA <=20 SENSITIVE Sensitive     AMPICILLIN/SULBACTAM 4 SENSITIVE Sensitive     PIP/TAZO <=4 SENSITIVE Sensitive     * >=100,000 COLONIES/mL ESCHERICHIA COLI   Klebsiella pneumoniae - MIC*    AMPICILLIN >=32 RESISTANT Resistant     CEFAZOLIN <=4 SENSITIVE Sensitive     CEFTRIAXONE <=0.25 SENSITIVE Sensitive     CIPROFLOXACIN <=0.25 SENSITIVE Sensitive     GENTAMICIN <=1 SENSITIVE Sensitive     IMIPENEM <=0.25 SENSITIVE Sensitive     NITROFURANTOIN 64 INTERMEDIATE Intermediate     TRIMETH/SULFA <=20 SENSITIVE Sensitive     AMPICILLIN/SULBACTAM 8 SENSITIVE Sensitive     PIP/TAZO <=4 SENSITIVE Sensitive     * >=100,000 COLONIES/mL KLEBSIELLA PNEUMONIAE  SARS Coronavirus 2 by RT PCR (hospital order, performed in [place] hospital lab) Nasopharyngeal  Nasopharyngeal Swab     Status: None   Collection Time: 06/09/20  1:20 PM   Specimen: Nasopharyngeal Swab  Result Value Ref Range Status   SARS Coronavirus 2 NEGATIVE NEGATIVE Final    Comment: (NOTE) SARS-CoV-2 target nucleic acids are NOT DETECTED.  The SARS-CoV-2 RNA is generally detectable in upper and lower respiratory specimens during the acute phase of infection. The lowest concentration of SARS-CoV-2 viral copies this assay can detect is 250 copies / mL. A negative result does not preclude SARS-CoV-2 infection and should not be used as the sole basis for treatment or other patient management decisions.  A negative result may occur with improper specimen collection / handling, submission of specimen other than nasopharyngeal swab, presence of viral mutation(s) within the areas targeted by this assay, and inadequate number of viral copies (<250 copies / mL). A negative result must be combined with clinical observations, patient history, and epidemiological information.  Fact Sheet for Patients:   StrictlyIdeas.no  Fact Sheet for Healthcare Providers: BankingDealers.co.za  This test is not yet approved or  cleared by the [place] FDA and has been authorized for detection  and/or diagnosis of SARS-CoV-2 by FDA under an Emergency Use Authorization (EUA).  This EUA will remain in effect (meaning this test can be used) for the duration of the COVID-19 declaration under Section 564(b)(1) of the Act, 21 U.S.C. section 360bbb-3(b)(1), unless the authorization is terminated or revoked sooner.  Performed at [facility]: [address] [place] [place], [place] [address]   Blood Culture ID Panel (Reflexed)     Status: Abnormal   Collection Time: 06/09/20  1:20 PM  Result Value Ref Range Status   Enterococcus faecalis NOT DETECTED NOT DETECTED Final   Enterococcus Faecium NOT DETECTED NOT DETECTED Final   Listeria monocytogenes  NOT DETECTED NOT DETECTED Final   Staphylococcus species DETECTED (A) NOT DETECTED Final    Comment: CRITICAL RESULT CALLED TO, READ BACK BY AND VERIFIED WITH: MYRA SLAUGHTER 06/10/20 AT 1450 BY AR    Staphylococcus aureus (BCID) NOT DETECTED NOT DETECTED Final   Staphylococcus epidermidis NOT DETECTED NOT DETECTED Final   Staphylococcus lugdunensis NOT DETECTED NOT DETECTED Final   Streptococcus species NOT DETECTED NOT DETECTED Final   Streptococcus agalactiae NOT DETECTED NOT DETECTED Final   Streptococcus pneumoniae NOT DETECTED NOT DETECTED Final   Streptococcus pyogenes NOT DETECTED NOT DETECTED Final   A.calcoaceticus-baumannii NOT DETECTED NOT DETECTED Final   Bacteroides fragilis NOT DETECTED NOT DETECTED Final   Enterobacterales NOT DETECTED NOT DETECTED Final   Enterobacter cloacae complex NOT DETECTED NOT DETECTED Final   Escherichia coli NOT DETECTED NOT DETECTED Final   Klebsiella aerogenes NOT DETECTED NOT DETECTED Final   Klebsiella oxytoca NOT DETECTED NOT DETECTED Final   Klebsiella pneumoniae NOT DETECTED NOT DETECTED Final   Proteus species NOT DETECTED NOT DETECTED Final   Salmonella species NOT DETECTED NOT DETECTED Final   Serratia marcescens NOT DETECTED NOT DETECTED Final   Haemophilus influenzae NOT DETECTED NOT DETECTED Final   Neisseria meningitidis NOT DETECTED NOT DETECTED Final   Pseudomonas aeruginosa NOT DETECTED NOT DETECTED Final   Stenotrophomonas maltophilia NOT DETECTED NOT DETECTED Final   Candida albicans NOT DETECTED NOT DETECTED Final   Candida auris NOT DETECTED NOT DETECTED Final   Candida glabrata NOT DETECTED NOT DETECTED Final   Candida krusei NOT DETECTED NOT DETECTED Final   Candida parapsilosis NOT DETECTED NOT DETECTED Final   Candida tropicalis NOT DETECTED NOT DETECTED Final   Cryptococcus neoformans/gattii NOT DETECTED NOT DETECTED Final    Comment: Performed at [facility], [address] [place], [place] [address]    CULTURE, BLOOD (ROUTINE X 2) w Reflex to ID Panel     Status: None (Preliminary result)   Collection Time: 06/10/20  5:35 PM   Specimen: BLOOD  Result Value Ref Range Status   Specimen Description BLOOD LEFT ANTECUBITAL  Final   Special Requests   Final    BOTTLES DRAWN AEROBIC AND ANAEROBIC Blood Culture adequate volume   Culture   Final    NO GROWTH 3 DAYS Performed at [facility], [address] [place], [place] [address]    Report Status PENDING  Incomplete  CULTURE, BLOOD (ROUTINE X 2) w Reflex to ID Panel     Status: None (Preliminary result)   Collection Time: 06/10/20  5:41 PM   Specimen: BLOOD  Result Value Ref Range Status   Specimen Description BLOOD BLOOD LEFT HAND  Final   Special Requests   Final    BOTTLES DRAWN AEROBIC ONLY Blood Culture adequate volume   Culture   Final  NO GROWTH 3 DAYS Performed at [facility], [address] [place], [place] [address]    Report Status PENDING  Incomplete         Radiology Studies: No results found.      Scheduled Meds:  benztropine  0.5 mg Oral BID   calcium carbonate  1 tablet Oral TID WC   dronabinol  2.5 mg Oral BID AC   DULoxetine  30 mg Oral Daily   enoxaparin (LOVENOX) injection  30 mg Subcutaneous Q24H   feeding supplement (ENSURE ENLIVE)  237 mL Oral BID BM   feeding supplement (GLUCERNA SHAKE)  237 mL Oral BID BM   feeding supplement (GLUCERNA SHAKE)  237 mL Oral TID BM   ferrous sulfate  325 mg Oral BID WC   haloperidol  0.5 mg Oral BID   insulin aspart  0-9 Units Subcutaneous TID WC   insulin glargine  5 Units Subcutaneous QHS   multivitamin with minerals  1 tablet Oral Daily   pantoprazole  40 mg Oral Daily   Ensure Max Protein  11 oz Oral Daily   scopolamine  1 patch Transdermal Q72H   sodium bicarbonate  1,300 mg Oral BID   sodium chloride flush  3 mL Intravenous Q12H   Continuous Infusions:   ceFAZolin (ANCEF) IV       LOS: 3 days    Time  spent: 40 minutes    [name], MD [facility]   To contact the attending provider between 7A-7P or the covering provider during after hours 7P-7A, please log into the web site [email or web address] and access using universal [place] password for that web site. If you do not have the password, please call the hospital operator.  06/13/2020, 11:30 AM

## 2020-06-13 NOTE — Progress Notes (Signed)
Physical Therapy Treatment Patient Details Name: Gregory Moody MRN: [id number] DOB: 1976-05-22 Today's Date: 06/13/2020    History of Present Illness 44 y/o M w/ PMH of DM, GERD, gastroparesis, DKA, anemia, CKD, malnutrition, COVID-19 infection (04/23/2019), chronic urinary retention s/p suprapubic catheter, and multiple admissions who presents w/ weakness & nausea x 1 week. Pt c/o difficulty walking & no appetite for 1 week. Pt does use a walker.    PT Comments    Pt lying in bed and spanish interpreter called via phone. [facility] [phone] translated entire session. Pt agreeable to participate in PT session this afternoon stating that he feels a little bit better but is still weak. Pt performed bed mobility with mod I, requiring increased time to perform. Once seated edge of bed, pt reported minimal dizziness and requested a few minutes to gain his composure. Pt transferred with CGA for steadying and ambulated a total of 45 feet using RW with CGA. Pt initially with unsafe distance of RW with verbal cues to bring it closer to provide support. Pt required seated rest breaks every 15' of ambulation secondary to fatigue. Pt deferred further attempts at this time and returned to lying in bed. Pt voiced his concern over the short length of his catheter tubing and requested that clinician fix it. Pt educated that this was not in the PT scope of practice however would reach out to his nurse to pass the message along.     Follow Up Recommendations  Home health PT;Supervision for mobility/OOB     Equipment Recommendations  None recommended by PT    Recommendations for Other Services       Precautions / Restrictions Precautions Precautions: Fall Restrictions Weight Bearing Restrictions: No    Mobility  Bed Mobility Overal bed mobility: Modified Independent Bed Mobility: Supine to Sit;Sit to Supine     Supine to sit: Modified independent (Device/Increase time) Sit to supine: Modified  independent (Device/Increase time)   General bed mobility comments: pt able to perform supine <> sit with increased time and required no assistance; pt with mild reports of dizziness and requested increased time to sit edge of bed to gain his composure  Transfers Overall transfer level: Needs assistance Equipment used: Rolling walker (2 wheeled) Transfers: Sit to/from Stand Sit to Stand: Min guard         General transfer comment: pt abel to self initiate transfer and CGA provided from mid to full upright stance for safety; pt denies dizziness in standing  Ambulation/Gait Ambulation/Gait assistance: Min guard Gait Distance (Feet): 45 Feet Assistive device: Rolling walker (2 wheeled) Gait Pattern/deviations: Step-through pattern;Trunk flexed Gait velocity: decreased   General Gait Details: pt ambulated 15' x 3 using RW with seated rest break between each bout of ambulation; pt flexed at head and trunk and required verbal cues to keep RW closer to provide steadying with good carryover of cues   Stairs             Wheelchair Mobility    Modified Rankin (Stroke Patients Only)       Balance Overall balance assessment: Needs assistance Sitting-balance support: Feet supported;Bilateral upper extremity supported Sitting balance-Leahy Scale: Fair Sitting balance - Comments: close SBA for safety as pt with reports of minimal dizziness after sitting up; pt remains flexed forward with elbows on BLE   Standing balance support: Bilateral upper extremity supported Standing balance-Leahy Scale: Fair Standing balance comment: BUE on RW but no overt LOB noted  Cognition Arousal/Alertness: Awake/alert Behavior During Therapy: WFL for tasks assessed/performed Overall Cognitive Status: Within Functional Limits for tasks assessed                                        Exercises      General Comments        Pertinent  Vitals/Pain Pain Assessment: No/denies pain    Home Living                      Prior Function            PT Goals (current goals can now be found in the care plan section) Acute Rehab PT Goals Patient Stated Goal: get stronger and go home PT Goal Formulation: With patient Time For Goal Achievement: 06/24/20 Potential to Achieve Goals: Fair Progress towards PT goals: Progressing toward goals    Frequency    Min 2X/week      PT Plan Current plan remains appropriate    Co-evaluation              AM-PAC PT "6 Clicks" Mobility   Outcome Measure  Help needed turning from your back to your side while in a flat bed without using bedrails?: None Help needed moving from lying on your back to sitting on the side of a flat bed without using bedrails?: A Little Help needed moving to and from a bed to a chair (including a wheelchair)?: A Little Help needed standing up from a chair using your arms (e.g., wheelchair or bedside chair)?: A Little Help needed to walk in hospital room?: A Little Help needed climbing 3-5 steps with a railing? : A Little 6 Click Score: 19    End of Session Equipment Utilized During Treatment: Gait belt Activity Tolerance: Patient limited by fatigue Patient left: in bed;with call bell/phone within reach;with bed alarm set Nurse Communication: Mobility status;Other (comment) (pt requesting for catheter tubing to be lengthened) PT Visit Diagnosis: Unsteadiness on feet (R26.81);Other abnormalities of gait and mobility (R26.89);Muscle weakness (generalized) (M62.81);History of falling (Z91.81);Dizziness and giddiness (R42);Pain     Time: 5993-5701 PT Time Calculation (min) (ACUTE ONLY): 29 min  Charges:                        [name], SPT   [name] 06/13/2020, 3:37 PM

## 2020-06-14 LAB — CBC WITH DIFFERENTIAL/PLATELET
Abs Immature Granulocytes: 0.01 10*3/uL (ref 0.00–0.07)
Basophils Absolute: 0 10*3/uL (ref 0.0–0.1)
Basophils Relative: 0 %
Eosinophils Absolute: 0.2 10*3/uL (ref 0.0–0.5)
Eosinophils Relative: 4 %
HCT: 24.6 % — ABNORMAL LOW (ref 39.0–52.0)
Hemoglobin: 8 g/dL — ABNORMAL LOW (ref 13.0–17.0)
Immature Granulocytes: 0 %
Lymphocytes Relative: 32 %
Lymphs Abs: 1.6 10*3/uL (ref 0.7–4.0)
MCH: 30.2 pg (ref 26.0–34.0)
MCHC: 32.5 g/dL (ref 30.0–36.0)
MCV: 92.8 fL (ref 80.0–100.0)
Monocytes Absolute: 0.2 10*3/uL (ref 0.1–1.0)
Monocytes Relative: 5 %
Neutro Abs: 2.9 10*3/uL (ref 1.7–7.7)
Neutrophils Relative %: 59 %
Platelets: 224 10*3/uL (ref 150–400)
RBC: 2.65 MIL/uL — ABNORMAL LOW (ref 4.22–5.81)
RDW: 16.6 % — ABNORMAL HIGH (ref 11.5–15.5)
WBC: 5 10*3/uL (ref 4.0–10.5)
nRBC: 0 % (ref 0.0–0.2)

## 2020-06-14 LAB — RENAL FUNCTION PANEL
Albumin: 2.8 g/dL — ABNORMAL LOW (ref 3.5–5.0)
Anion gap: 5 (ref 5–15)
BUN: 29 mg/dL — ABNORMAL HIGH (ref 6–20)
CO2: 21 mmol/L — ABNORMAL LOW (ref 22–32)
Calcium: 8.2 mg/dL — ABNORMAL LOW (ref 8.9–10.3)
Chloride: 111 mmol/L (ref 98–111)
Creatinine, Ser: 2.15 mg/dL — ABNORMAL HIGH (ref 0.61–1.24)
GFR calc Af Amer: 42 mL/min — ABNORMAL LOW (ref >60)
GFR calc non Af Amer: 36 mL/min — ABNORMAL LOW (ref >60)
Glucose, Bld: 173 mg/dL — ABNORMAL HIGH (ref 70–99)
Phosphorus: 2.1 mg/dL — ABNORMAL LOW (ref 2.5–4.6)
Potassium: 4.9 mmol/L (ref 3.5–5.1)
Sodium: 137 mmol/L (ref 135–145)

## 2020-06-14 LAB — CULTURE, BLOOD (ROUTINE X 2)
Culture: NO GROWTH
Special Requests: ADEQUATE

## 2020-06-14 LAB — MAGNESIUM: Magnesium: 2.5 mg/dL — ABNORMAL HIGH (ref 1.7–2.4)

## 2020-06-14 MED ORDER — SODIUM PHOSPHATES 45 MMOLE/15ML IV SOLN
30.0000 mmol | Freq: Once | INTRAVENOUS | Status: AC
  Administered 2020-06-14: 30 mmol via INTRAVENOUS
  Filled 2020-06-14: qty 10

## 2020-06-14 NOTE — Progress Notes (Signed)
PROGRESS NOTE    Gregory Moody  PRF:[id number] DOB: [birth year] DOA: 06/09/2020 PCP: Gregory Moody   Chief Complaint  Patient presents with  . Weakness    Brief Narrative:  HPI per Dr. Jimmye Moody 44 y/o M w/ PMH of DM2, chronic urinary retention s/p suprapubic catheter, possible depression who presents w/ weakness & nausea x 1 week. Pt is a poor historian. Pt c/o difficulty walking & no appetite for 1 week. Pt does use a walker. Pt denies any sick contacts. Pt denies getting COVID19 vaccine. Also, pt c/o diarrhea daily x 2-3 years. Pt has approx 2-3 episodes of diarrhea daily. Pt denies any bright red blood or black stools. Of note, pt is allergic to milk. Pt denies any fevers, chills, sweating, cough, chest pain, shortness of breath, abd pain, or constipation. In the ED patient noted to be hypothermic and hypotensive and placed on IV fluids, patient pancultured.  Patient did receive a dose of IV antibiotics in the ED.   Assessment & Plan:   Principal Problem:   Sepsis secondary to UTI [facility]) Active Problems:   E-coli UTI   UTI due to Klebsiella species   GERD (gastroesophageal reflux disease)   Hypotension   Anemia of chronic disease   Protein-calorie malnutrition, severe   Diarrhea   Urinary retention   Hyperglycemia due to type 2 diabetes mellitus (HCC)   Generalized weakness   Encephalopathy   Hypothermia   Hyponatremia  1 severe sepsis with acute organ dysfunction (hypotension) secondary to E. coli and Klebsiella pneumonia , POA Patient had presented with generalized weakness noted to be hypothermic and hypotensive on admission.  Blood pressure on presentation responded to IV fluids.  Patient pancultured.  Chest x-ray done negative for any acute abnormalities.  Patient with chronic suprapubic catheter.  Urine cultures positive for E. coli and Klebsiella pneumoniae.  Blood pressure improved with hydration.  Procalcitonin < 0.1.  UDS negative.  Alcohol  level was less than 10.  CT head which was done showed no acute intracranial findings but atrophy present and advanced for age.  Patient with improvement with systolic blood pressure.  TSH within normal limits.  Saline lock IV fluids.  Diflucan has been discontinued.  He was on IV Rocephin and transitioned to IV cefazolin.  If continued improvement could likely transition to oral antibiotics of Keflex tomorrow. Patient status post suprapubic catheter exchange 06/12/2020.  Urology PA following.  Follow.  2.  Generalized weakness Questionable etiology.  Likely secondary to urosepsis in the setting of poor oral intake.Patient pancultured.  TSH within normal limits.  Random cortisol was 9.1.  Urine cultures positive for E. coli and Klebsiella pneumonia.  Improving clinically.  Continue IV antibiotics and likely transition to oral antibiotics in the next 24 hours.  PT OT.  IV fluids have been saline lock.  Will need home health therapies on discharge.    3.  Metabolic acidosis Resolved.  Continue home regimen bicarb tablets.   4.  Poorly controlled diabetes mellitus type 2 Hemoglobin A1c 8.3(05/02/2020).  CBG of 173 this morning per labs.  Continue current dose of Lantus 5 units daily.  Sliding scale insulin.  Follow.  5.  Chronic urinary retention status post suprapubic catheter Catheter last changed 06/06/2020 per patient however per urology PA patient missed appointment and suprapubic catheter has not been changed and is due to be changed 06/13/2020, in the outpatient setting.  Urinalysis on admission was turbid, large leukocytes, nitrite negative, greater than 50 WBCs, many bacteria,  budding yeast present.  Urine cultures with greater than one hundred thousand colonies of E. coli and Klebsiella pneumoniae.  Some budding yeast also noted in the urine.  Urine cultures did not grow any yeast.  Patient started on Diflucan empirically initially which was subsequently discontinued as urine cultures were finalized  with no growth.  Patient was on IV Rocephin and transitioned to IV Ancef.  If continued improvement will transition to oral Keflex tomorrow.  Follow.  6.  Hyponatremia Likely secondary to hypovolemic hyponatremia.  Improved with hydration.  TSH within normal limits at 1.462.  Random cortisol level at 9.1.  Currently at 137.  Fluids have been saline locked.  Follow.   7.  Chronic diarrhea Stool cultures ordered and currently pending.  Outpatient follow-up.  8.  Severe protein calorie malnutrition Nutrition consulted.  Continue nutritional supplementation.   9.  chronic kidney disease stage IIIb Renal function seems to be close to baseline.  Creatinine currently at 2.15.  Continue bicarb tablets.  IV fluids have been saline lock.  Follow.   10.  Gastroesophageal reflux disease PPI.  11.  Depression Stable.  Continue current regimen of Cogentin, Haldol, Cymbalta.  Follow.   12.  Anemia of chronic disease Hemoglobin currently stable at 8.  Likely dilutional effect.  Saline lock IV fluids.  Continue oral iron supplementation.     DVT prophylaxis: Lovenox Code Status: Full Family Communication: Updated patient.  No family at bedside. Disposition:   Status is: Inpatient    Dispo: The patient is from: Home              Anticipated d/c is to: Home with home health services               Anticipated d/c date is: To be determined.              Patient currently with sepsis secondary to UTI with ongoing hypotension slowly responding to IV fluids, generalized weakness.  On IV antibiotics.  Not stable for discharge.         Consultants:   Urology: Dr. Diamantina Moody 06/13/2020  Procedures:   CT head 06/09/2020  CT abdomen and pelvis 06/09/2020  Chest x-ray 06/09/2020  Suprapubic catheter exchange under fluoroscopy per interventional radiology, Dr. Kathlene Moody 06/13/2020  Antimicrobials:  IV Rocephin 06/11/2020>>>> 06/13/2020  Diflucan 06/11/2020>>>>> 06/12/2020   IV cefazolin  06/13/2020>>>>   Subjective: In bed about to eat his lunch.  Weakness is improving per patient.  No chest pain.  No shortness of breath.  No abdominal pain.  No nausea or vomiting.   Objective: Vitals:   06/13/20 0037 06/13/20 0744 06/14/20 0006 06/14/20 0843  BP: (!) 127/92 113/82 112/72 94/71  Pulse: 74 80 83 74  Resp: 17 18 18 16   Temp: 98.1 F (36.7 C) 98.6 F (37 C) 98.4 F (36.9 C) (!) 97.4 F (36.3 C)  TempSrc: Oral Oral Oral Oral  SpO2: 100% 100% 100% 100%  Weight:      Height:        Intake/Output Summary (Last 24 hours) at 06/14/2020 1229 Last data filed at 06/14/2020 1020 Gross per 24 hour  Intake 480 ml  Output 950 ml  Net -470 ml   Filed Weights   06/09/20 1251  Weight: 45.4 kg    Examination:  General exam: Frail.  Emaciated.  Cachectic.   Respiratory system: CTAB.  No wheezes, no crackles, no rhonchi.  Normal respiratory effort.   Cardiovascular system: RRR no murmurs rubs or gallops.  No JVD.  No lower extremity edema.  Gastrointestinal system: Abdomen is soft, nontender, nondistended, positive bowel sounds.  No rebound.  No guarding.  Gu: Suprapubic catheter in place Central nervous system: Alert and oriented. No focal neurological deficits. Extremities: Symmetric 5 x 5 power. Skin: No rashes, lesions or ulcers Psychiatry: Judgement and insight appear normal. Mood & affect appropriate.     Data Reviewed: I have personally reviewed following labs and imaging studies  CBC: Recent Labs  Lab 06/10/20 0357 06/11/20 0518 06/12/20 0352 06/13/20 0340 06/14/20 1001  WBC 4.9 5.5 5.8 5.6 5.0  NEUTROABS  --   --  3.2 3.0 2.9  HGB 8.7* 9.1* 8.8* 7.9* 8.0*  HCT 24.2* 26.0* 24.6* 23.9* 24.6*  MCV 85.8 86.4 84.8 90.9 92.8  PLT 231 227 207 215 357    Basic Metabolic Panel: Recent Labs  Lab 06/09/20 1320 06/09/20 2357 06/10/20 1114 06/11/20 0518 06/12/20 0352 06/13/20 0340 06/14/20 1001  NA 128*   < > 129* 128* 132* 131* 137  K 4.1   < > 3.7 3.7  3.9 4.5 4.9  CL 105   < > 100 97* 100 103 111  CO2 13*   < > 22 23 25 25  21*  GLUCOSE 273*   < > 179* 170* 71 170* 173*  BUN 38*   < > 37* 31* 29* 26* 29*  CREATININE 2.32*   < > 2.29* 2.30* 2.16* 2.12* 2.15*  CALCIUM 7.8*   < > 8.2* 8.7* 8.4* 8.1* 8.2*  MG 2.2  --   --   --  1.8 2.3 2.5*  PHOS  --   --   --   --  2.2* 2.6 2.1*   < > = values in this interval not displayed.    GFR: Estimated Creatinine Clearance: 27.9 mL/min (A) (by C-G formula based on SCr of 2.15 mg/dL (H)).  Liver Function Tests: Recent Labs  Lab 06/09/20 1301 06/12/20 0352 06/13/20 0340 06/14/20 1001  AST 10*  --   --   --   ALT 8  --   --   --   ALKPHOS 56  --   --   --   BILITOT 1.2  --   --   --   PROT 7.0  --   --   --   ALBUMIN 3.3* 2.6* 2.5* 2.8*    CBG: Recent Labs  Lab 06/12/20 2126 06/13/20 0745 06/13/20 1126 06/13/20 1634 06/13/20 2043  GLUCAP 164* 178* 156* 169* 162*     Recent Results (from the past 240 hour(s))  Blood Culture (routine x 2)     Status: Abnormal   Collection Time: 06/09/20  1:20 PM   Specimen: BLOOD  Result Value Ref Range Status   Specimen Description   Final    BLOOD RIGHT ANTECUBITAL Performed at [facility], [address] [place], [place] [address]    Special Requests   Final    BOTTLES DRAWN AEROBIC AND ANAEROBIC Blood Culture adequate volume Performed at [facility], [address] [place], [place] [address]    Culture  Setup Time   Final    AEROBIC BOTTLE ONLY GRAM POSITIVE COCCI Organism ID to follow CRITICAL RESULT CALLED TO, READ BACK BY AND VERIFIED WITH: MYRA SLAUGHTER 06/10/20 AT 1450 BY ACR GRAM STAIN REVIEWED-AGREE WITH RESULT    Culture (A)  Final    STAPHYLOCOCCUS CAPITIS THE SIGNIFICANCE OF ISOLATING THIS ORGANISM FROM A SINGLE SET OF BLOOD CULTURES WHEN MULTIPLE  SETS ARE DRAWN IS UNCERTAIN. PLEASE NOTIFY THE MICROBIOLOGY DEPARTMENT WITHIN ONE WEEK IF SPECIATION AND SENSITIVITIES ARE REQUIRED. Performed at [facility], [place] [address], [place], [place] [address]    Report Status 06/12/2020 FINAL  Final  Blood Culture (routine x 2)     Status: None   Collection Time: 06/09/20  1:20 PM   Specimen: BLOOD  Result Value Ref Range Status   Specimen Description BLOOD BLOOD RIGHT HAND  Final   Special Requests   Final    BOTTLES DRAWN AEROBIC AND ANAEROBIC Blood Culture adequate volume   Culture   Final    NO GROWTH 5 DAYS Performed at [facility], [address] [place], [place] [address]    Report Status 06/14/2020 FINAL  Final  Urine culture     Status: Abnormal   Collection Time: 06/09/20  1:20 PM   Specimen: In/Out Cath Urine  Result Value Ref Range Status   Specimen Description   Final    IN/OUT CATH URINE Performed at [facility], [address], [place], [place] [address]    Special Requests   Final    NONE Performed at [facility], [address] [place], [place] [address]    Culture (A)  Final    >=100,000 COLONIES/mL ESCHERICHIA COLI >=100,000 COLONIES/mL KLEBSIELLA PNEUMONIAE    Report Status 06/12/2020 FINAL  Final   Organism ID, Bacteria ESCHERICHIA COLI (A)  Final   Organism ID, Bacteria KLEBSIELLA PNEUMONIAE (A)  Final      Susceptibility   Escherichia coli - MIC*    AMPICILLIN >=32 RESISTANT Resistant     CEFAZOLIN <=4 SENSITIVE Sensitive     CEFTRIAXONE <=0.25 SENSITIVE Sensitive     CIPROFLOXACIN 2 INTERMEDIATE Intermediate     GENTAMICIN <=1 SENSITIVE Sensitive     IMIPENEM <=0.25 SENSITIVE Sensitive     NITROFURANTOIN <=16 SENSITIVE Sensitive     TRIMETH/SULFA <=20 SENSITIVE Sensitive     AMPICILLIN/SULBACTAM 4 SENSITIVE Sensitive     PIP/TAZO <=4 SENSITIVE Sensitive     * >=100,000 COLONIES/mL ESCHERICHIA COLI   Klebsiella pneumoniae - MIC*    AMPICILLIN >=32 RESISTANT Resistant     CEFAZOLIN <=4 SENSITIVE Sensitive     CEFTRIAXONE <=0.25 SENSITIVE Sensitive     CIPROFLOXACIN <=0.25 SENSITIVE Sensitive     GENTAMICIN  <=1 SENSITIVE Sensitive     IMIPENEM <=0.25 SENSITIVE Sensitive     NITROFURANTOIN 64 INTERMEDIATE Intermediate     TRIMETH/SULFA <=20 SENSITIVE Sensitive     AMPICILLIN/SULBACTAM 8 SENSITIVE Sensitive     PIP/TAZO <=4 SENSITIVE Sensitive     * >=100,000 COLONIES/mL KLEBSIELLA PNEUMONIAE  SARS Coronavirus 2 by RT PCR (hospital order, performed in [place] hospital lab) Nasopharyngeal Nasopharyngeal Swab     Status: None   Collection Time: 06/09/20  1:20 PM   Specimen: Nasopharyngeal Swab  Result Value Ref Range Status   SARS Coronavirus 2 NEGATIVE NEGATIVE Final    Comment: (NOTE) SARS-CoV-2 target nucleic acids are NOT DETECTED.  The SARS-CoV-2 RNA is generally detectable in upper and lower respiratory specimens during the acute phase of infection. The lowest concentration of SARS-CoV-2 viral copies this assay can detect is 250 copies / mL. A negative result does not preclude SARS-CoV-2 infection and should not be used as the sole basis for treatment or other patient management decisions.  A negative result may occur with improper specimen collection / handling, submission of specimen other than nasopharyngeal swab, presence of viral mutation(s)  within the areas targeted by this assay, and inadequate number of viral copies (<250 copies / mL). A negative result must be combined with clinical observations, patient history, and epidemiological information.  Fact Sheet for Patients:   StrictlyIdeas.no  Fact Sheet for Healthcare Providers: BankingDealers.co.za  This test is not yet approved or  cleared by the [place] FDA and has been authorized for detection and/or diagnosis of SARS-CoV-2 by FDA under an Emergency Use Authorization (EUA).  This EUA will remain in effect (meaning this test can be used) for the duration of the COVID-19 declaration under Section 564(b)(1) of the Act, 21 U.S.C. section 360bbb-3(b)(1), unless the  authorization is terminated or revoked sooner.  Performed at [facility], [address] [place], [place] [address]   Blood Culture ID Panel (Reflexed)     Status: Abnormal   Collection Time: 06/09/20  1:20 PM  Result Value Ref Range Status   Enterococcus faecalis NOT DETECTED NOT DETECTED Final   Enterococcus Faecium NOT DETECTED NOT DETECTED Final   Listeria monocytogenes NOT DETECTED NOT DETECTED Final   Staphylococcus species DETECTED (A) NOT DETECTED Final    Comment: CRITICAL RESULT CALLED TO, READ BACK BY AND VERIFIED WITH: MYRA SLAUGHTER 06/10/20 AT 1450 BY AR    Staphylococcus aureus (BCID) NOT DETECTED NOT DETECTED Final   Staphylococcus epidermidis NOT DETECTED NOT DETECTED Final   Staphylococcus lugdunensis NOT DETECTED NOT DETECTED Final   Streptococcus species NOT DETECTED NOT DETECTED Final   Streptococcus agalactiae NOT DETECTED NOT DETECTED Final   Streptococcus pneumoniae NOT DETECTED NOT DETECTED Final   Streptococcus pyogenes NOT DETECTED NOT DETECTED Final   A.calcoaceticus-baumannii NOT DETECTED NOT DETECTED Final   Bacteroides fragilis NOT DETECTED NOT DETECTED Final   Enterobacterales NOT DETECTED NOT DETECTED Final   Enterobacter cloacae complex NOT DETECTED NOT DETECTED Final   Escherichia coli NOT DETECTED NOT DETECTED Final   Klebsiella aerogenes NOT DETECTED NOT DETECTED Final   Klebsiella oxytoca NOT DETECTED NOT DETECTED Final   Klebsiella pneumoniae NOT DETECTED NOT DETECTED Final   Proteus species NOT DETECTED NOT DETECTED Final   Salmonella species NOT DETECTED NOT DETECTED Final   Serratia marcescens NOT DETECTED NOT DETECTED Final   Haemophilus influenzae NOT DETECTED NOT DETECTED Final   Neisseria meningitidis NOT DETECTED NOT DETECTED Final   Pseudomonas aeruginosa NOT DETECTED NOT DETECTED Final   Stenotrophomonas maltophilia NOT DETECTED NOT DETECTED Final   Candida albicans NOT DETECTED NOT DETECTED Final   Candida auris NOT  DETECTED NOT DETECTED Final   Candida glabrata NOT DETECTED NOT DETECTED Final   Candida krusei NOT DETECTED NOT DETECTED Final   Candida parapsilosis NOT DETECTED NOT DETECTED Final   Candida tropicalis NOT DETECTED NOT DETECTED Final   Cryptococcus neoformans/gattii NOT DETECTED NOT DETECTED Final    Comment: Performed at [facility], [address] [place], [place] [address]  CULTURE, BLOOD (ROUTINE X 2) w Reflex to ID Panel     Status: None (Preliminary result)   Collection Time: 06/10/20  5:35 PM   Specimen: BLOOD  Result Value Ref Range Status   Specimen Description BLOOD LEFT ANTECUBITAL  Final   Special Requests   Final    BOTTLES DRAWN AEROBIC AND ANAEROBIC Blood Culture adequate volume   Culture   Final    NO GROWTH 4 DAYS Performed at [facility], [address] [place], [place] [address]    Report Status PENDING  Incomplete  CULTURE, BLOOD (ROUTINE X 2) w Reflex  to ID Panel     Status: None (Preliminary result)   Collection Time: 06/10/20  5:41 PM   Specimen: BLOOD  Result Value Ref Range Status   Specimen Description BLOOD BLOOD LEFT HAND  Final   Special Requests   Final    BOTTLES DRAWN AEROBIC ONLY Blood Culture adequate volume   Culture   Final    NO GROWTH 4 DAYS Performed at [facility], [address], [place], [place] [address]    Report Status PENDING  Incomplete         Radiology Studies: IR Catheter Tube Change  Result Date: 06/13/2020 INDICATION: 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 The patient has been admitted with a urinary infection and is also due for routine exchange of the catheter. EXAM: EXCHANGE OF SUPRAPUBIC BLADDER CATHETER UNDER FLUOROSCOPY COMPARISON:  None. MEDICATIONS: None ANESTHESIA/SEDATION: None CONTRAST:  10 mL Visipaque 320-administered into the collecting system(s) FLUOROSCOPY TIME:  Fluoroscopy Time: 30 seconds.  COMPLICATIONS: None immediate. PROCEDURE: Informed written consent was obtained from the patient after a thorough discussion of the procedural risks, benefits and alternatives. All questions were addressed. Maximal Sterile Barrier Technique was utilized including caps, mask, sterile gowns, sterile gloves, sterile drape, hand hygiene and skin antiseptic. A timeout was performed prior to the initiation of the procedure. The pre-existing 70 French multipurpose suprapubic catheter was injected with contrast material, cut and removed over a guidewire. A 16 [place] tip balloon retention catheter was then advanced over the wire and the wire removed. The retention balloon was inflated with 9 mL of saline. Catheter positioning was confirmed with injection of contrast and a fluoroscopic image obtained. The catheter was then connected to a gravity drainage bag. IMPRESSION: Exchange and conversion of 16 French multipurpose suprapubic catheter to a 16 [place] tip balloon retention catheter. Electronically Signed   By: Aletta Edouard M.D.   On: 06/13/2020 14:02        Scheduled Meds: . benztropine  0.5 mg Oral BID  . calcium carbonate  1 tablet Oral TID WC  . dronabinol  2.5 mg Oral BID AC  . DULoxetine  30 mg Oral Daily  . enoxaparin (LOVENOX) injection  30 mg Subcutaneous Q24H  . feeding supplement (ENSURE ENLIVE)  237 mL Oral BID BM  . feeding supplement (GLUCERNA SHAKE)  237 mL Oral BID BM  . feeding supplement (GLUCERNA SHAKE)  237 mL Oral TID BM  . ferrous sulfate  325 mg Oral BID WC  . haloperidol  0.5 mg Oral BID  . insulin aspart  0-9 Units Subcutaneous TID WC  . insulin glargine  5 Units Subcutaneous QHS  . multivitamin with minerals  1 tablet Oral Daily  . pantoprazole  40 mg Oral Daily  . Ensure Max Protein  11 oz Oral Daily  . scopolamine  1 patch Transdermal Q72H  . sodium bicarbonate  1,300 mg Oral BID  . sodium chloride flush  3 mL Intravenous Q12H   Continuous  Infusions: .  ceFAZolin (ANCEF) IV 1 g (06/14/20 1012)  . sodium phosphate  Dextrose 5% IVPB       LOS: 4 days    Time spent: 40 minutes    [name], MD [facility]   To contact the attending provider between 7A-7P or the covering provider during after hours 7P-7A, please log into the web site [email or web address] and access using universal [place] password for that web site. If you do  not have the password, please call the hospital operator.  06/14/2020, 12:29 PM

## 2020-06-15 LAB — RENAL FUNCTION PANEL
Albumin: 2.7 g/dL — ABNORMAL LOW (ref 3.5–5.0)
Anion gap: 4 — ABNORMAL LOW (ref 5–15)
BUN: 27 mg/dL — ABNORMAL HIGH (ref 6–20)
CO2: 19 mmol/L — ABNORMAL LOW (ref 22–32)
Calcium: 8.4 mg/dL — ABNORMAL LOW (ref 8.9–10.3)
Chloride: 112 mmol/L — ABNORMAL HIGH (ref 98–111)
Creatinine, Ser: 2.11 mg/dL — ABNORMAL HIGH (ref 0.61–1.24)
GFR calc Af Amer: 43 mL/min — ABNORMAL LOW (ref >60)
GFR calc non Af Amer: 37 mL/min — ABNORMAL LOW (ref >60)
Glucose, Bld: 155 mg/dL — ABNORMAL HIGH (ref 70–99)
Phosphorus: 3.6 mg/dL (ref 2.5–4.6)
Potassium: 5.2 mmol/L — ABNORMAL HIGH (ref 3.5–5.1)
Sodium: 135 mmol/L (ref 135–145)

## 2020-06-15 LAB — CULTURE, BLOOD (ROUTINE X 2)
Culture: NO GROWTH
Culture: NO GROWTH
Special Requests: ADEQUATE
Special Requests: ADEQUATE

## 2020-06-15 LAB — HEMOGLOBIN AND HEMATOCRIT, BLOOD
HCT: 23.5 % — ABNORMAL LOW (ref 39.0–52.0)
Hemoglobin: 8 g/dL — ABNORMAL LOW (ref 13.0–17.0)

## 2020-06-15 MED ORDER — INSULIN ASPART 100 UNIT/ML ~~LOC~~ SOLN
0.0000 [IU] | Freq: Every day | SUBCUTANEOUS | Status: DC
  Administered 2020-06-15: 5 [IU] via SUBCUTANEOUS
  Administered 2020-06-16 – 2020-06-17 (×2): 2 [IU] via SUBCUTANEOUS
  Administered 2020-06-18: 3 [IU] via SUBCUTANEOUS
  Filled 2020-06-15 (×4): qty 1

## 2020-06-15 MED ORDER — CEPHALEXIN 500 MG PO CAPS
500.0000 mg | ORAL_CAPSULE | Freq: Three times a day (TID) | ORAL | Status: DC
  Administered 2020-06-15 – 2020-06-19 (×11): 500 mg via ORAL
  Filled 2020-06-15 (×11): qty 1

## 2020-06-15 MED ORDER — LOPERAMIDE HCL 2 MG PO CAPS
2.0000 mg | ORAL_CAPSULE | Freq: Once | ORAL | Status: AC
  Administered 2020-06-15: 2 mg via ORAL
  Filled 2020-06-15: qty 1

## 2020-06-15 MED ORDER — INSULIN ASPART 100 UNIT/ML ~~LOC~~ SOLN
0.0000 [IU] | Freq: Three times a day (TID) | SUBCUTANEOUS | Status: DC
  Administered 2020-06-16: 5 [IU] via SUBCUTANEOUS
  Administered 2020-06-16: 1 [IU] via SUBCUTANEOUS
  Administered 2020-06-17: 5 [IU] via SUBCUTANEOUS
  Administered 2020-06-17: 3 [IU] via SUBCUTANEOUS
  Administered 2020-06-18: 5 [IU] via SUBCUTANEOUS
  Administered 2020-06-18: 2 [IU] via SUBCUTANEOUS
  Administered 2020-06-18: 3 [IU] via SUBCUTANEOUS
  Administered 2020-06-19: 2 [IU] via SUBCUTANEOUS
  Filled 2020-06-15 (×8): qty 1

## 2020-06-15 NOTE — Progress Notes (Signed)
FBS [id number]

## 2020-06-15 NOTE — Progress Notes (Signed)
PROGRESS NOTE    Gregory Moody  RWE:[id number] DOB: [birth year] DOA: 06/09/2020 PCP: [facility]   Chief Complaint  Patient presents with   Weakness    Brief Narrative:  HPI per Dr. Jimmye Norman 44 y/o M w/ PMH of DM2, chronic urinary retention s/p suprapubic catheter, possible depression who presents w/ weakness & nausea x 1 week. Pt is a poor historian. Pt c/o difficulty walking & no appetite for 1 week. Pt does use a walker. Pt denies any sick contacts. Pt denies getting COVID19 vaccine. Also, pt c/o diarrhea daily x 2-3 years. Pt has approx 2-3 episodes of diarrhea daily. Pt denies any bright red blood or black stools. Of note, pt is allergic to milk. Pt denies any fevers, chills, sweating, cough, chest pain, shortness of breath, abd pain, or constipation. In the ED patient noted to be hypothermic and hypotensive and placed on IV fluids, patient pancultured.  Patient did receive a dose of IV antibiotics in the ED.   Assessment & Plan:   Principal Problem:   Sepsis secondary to UTI [facility]) Active Problems:   E-coli UTI   UTI due to Klebsiella species   GERD (gastroesophageal reflux disease)   Hypotension   Anemia of chronic disease   Protein-calorie malnutrition, severe   Diarrhea   Urinary retention   Hyperglycemia due to type 2 diabetes mellitus (HCC)   Generalized weakness   Encephalopathy   Hypothermia   Hyponatremia  1 severe sepsis with acute organ dysfunction (hypotension) secondary to E. coli and Klebsiella pneumonia , POA Patient had presented with generalized weakness noted to be hypothermic and hypotensive on admission.  Blood pressure on presentation responded to IV fluids.  Patient pancultured.  Chest x-ray done negative for any acute abnormalities.  Patient with chronic suprapubic catheter.  Urine cultures positive for E. coli and Klebsiella pneumoniae.  Blood pressure improved with hydration.  Procalcitonin < 0.1.  UDS negative.  Alcohol  level was less than 10.  CT head which was done showed no acute intracranial findings but atrophy present and advanced for age.  Patient with improvement with systolic blood pressure.  TSH within normal limits.  Saline lock IV fluids.  Diflucan has been discontinued.  He was on IV Rocephin and transitioned to IV cefazolin.  We will transition from IV cefazolin to oral Keflex to complete a 10 to 14-day course of antibiotic treatment.  Patient status post suprapubic catheter exchange 06/12/2020.  Urology PA was following.  Outpatient follow-up with urology and PCP. Follow.  2.  Generalized weakness Questionable etiology.  Likely secondary to urosepsis in the setting of poor oral intake.Patient pancultured.  TSH within normal limits.  Random cortisol was 9.1.  Urine cultures positive for E. coli and Klebsiella pneumonia.  Improving clinically.  Transition from IV antibiotics to oral antibiotics.  PT/OT.  Home health therapies on discharge.    3.  Metabolic acidosis Continue bicarb tablets.  Follow.   4.  Poorly controlled diabetes mellitus type 2 Hemoglobin A1c 8.3(05/02/2020).  Blood glucose levels of 155 on morning labs.  Continue Lantus 5 units daily.  Sliding scale insulin.   5.  Chronic urinary retention status post suprapubic catheter Catheter last changed 06/06/2020 per patient however per urology PA patient missed appointment and suprapubic catheter has not been changed and is due to be changed 06/13/2020, in the outpatient setting.  Urinalysis on admission was turbid, large leukocytes, nitrite negative, greater than 50 WBCs, many bacteria, budding yeast present.  Urine cultures with  greater than one hundred thousand colonies of E. coli and Klebsiella pneumoniae.  Some budding yeast also noted in the urine.  Urine cultures did not grow any yeast.  Patient started on Diflucan empirically initially which was subsequently discontinued as urine cultures were finalized with no growth.  Patient was on IV  Rocephin and transitioned to IV Ancef.  Will discontinue IV Ancef and transition to oral Keflex.  Follow.    6.  Hyponatremia Likely secondary to hypovolemic hyponatremia.  Improved with hydration.  TSH within normal limits at 1.462.  Random cortisol level at 9.1.  Sodium at 135.  IV fluids saline lock.  Follow.    7.  Chronic diarrhea Stool cultures ordered and currently pending.  Outpatient follow-up.  8.  Severe protein calorie malnutrition Nutrition consulted.  Continue nutritional supplementation.   9.  chronic kidney disease stage IIIb Renal function seems to be close to baseline.  Creatinine currently at 2.11.  Bicarb tablets.  IV fluids saline lock.  Follow.   10.  Gastroesophageal reflux disease Continue PPI.  11.  Depression Continue Haldol, Cogentin, Cymbalta.  Outpatient follow-up.   12.  Anemia of chronic disease Hemoglobin currently stable at 80.  Likely dilutional effect.  IV fluids have been saline lock.  Continue oral iron supplementation.  Follow.   DVT prophylaxis: Lovenox Code Status: Full Family Communication: Updated patient.  No family at bedside. Disposition:   Status is: Inpatient    Dispo: The patient is from: Home              Anticipated d/c is to: Home with home health services               Anticipated d/c date is: 06/16/2020              Patient currently with sepsis secondary to UTI with ongoing hypotension slowly responding to IV fluids, generalized weakness.  On IV antibiotics.  Being transitioned to oral antibiotics.  Not stable for discharge.         Consultants:   Urology: Dr. Diamantina Providence 06/13/2020  Procedures:   CT head 06/09/2020  CT abdomen and pelvis 06/09/2020  Chest x-ray 06/09/2020  Suprapubic catheter exchange under fluoroscopy per interventional radiology, Dr. Kathlene Cote 06/13/2020  Antimicrobials:  IV Rocephin 06/11/2020>>>> 06/13/2020  Diflucan 06/11/2020>>>>> 06/12/2020   IV cefazolin 06/13/2020>>>> 06/15/2020  Keflex  06/15/2020   Subjective: Sitting up on bedside commode with some loose stools.  Still with weakness but slowly improving.  No chest pain or shortness of breath.  No abdominal pain.  No nausea or vomiting.  Tolerating current diet.   Objective: Vitals:   06/14/20 0843 06/14/20 1639 06/15/20 0019 06/15/20 0751  BP: 94/71 128/86 114/80 (!) 86/70  Pulse: 74 80 73 72  Resp: 16 17 16 16   Temp: (!) 97.4 F (36.3 C) 98 F (36.7 C) 98.4 F (36.9 C) 97.9 F (36.6 C)  TempSrc: Oral Oral Oral Oral  SpO2: 100% 100% 100% 100%  Weight:      Height:        Intake/Output Summary (Last 24 hours) at 06/15/2020 1227 Last data filed at 06/15/2020 1100 Gross per 24 hour  Intake 1200 ml  Output 1975 ml  Net -775 ml   Filed Weights   06/09/20 1251  Weight: 45.4 kg    Examination:  General exam: Cachectic.  Frail.  Emaciated.  Frail.  Respiratory system: Clear to auscultation bilaterally.  No wheezes, no crackles, no rhonchi.  Normal respiratory effort.  Cardiovascular system: Regular rate rhythm no murmurs rubs or gallops.  No JVD.  No lower extremity edema.  Gastrointestinal system: Abdomen is nontender, nondistended, soft, positive bowel sounds.  No rebound.  No guarding.  Gu: Suprapubic catheter in place Central nervous system: Alert and oriented. No focal neurological deficits. Extremities: Symmetric 5 x 5 power. Skin: No rashes, lesions or ulcers Psychiatry: Judgement and insight appear normal. Mood & affect appropriate.     Data Reviewed: I have personally reviewed following labs and imaging studies  CBC: Recent Labs  Lab 06/10/20 0357 06/10/20 0357 06/11/20 0518 06/12/20 0352 06/13/20 0340 06/14/20 1001 06/15/20 0410  WBC 4.9  --  5.5 5.8 5.6 5.0  --   NEUTROABS  --   --   --  3.2 3.0 2.9  --   HGB 8.7*   < > 9.1* 8.8* 7.9* 8.0* 8.0*  HCT 24.2*   < > 26.0* 24.6* 23.9* 24.6* 23.5*  MCV 85.8  --  86.4 84.8 90.9 92.8  --   PLT 231  --  227 207 215 224  --    < > = values in  this interval not displayed.    Basic Metabolic Panel: Recent Labs  Lab 06/09/20 1320 06/09/20 2357 06/11/20 0518 06/12/20 0352 06/13/20 0340 06/14/20 1001 06/15/20 0410  NA 128*   < > 128* 132* 131* 137 135  K 4.1   < > 3.7 3.9 4.5 4.9 5.2*  CL 105   < > 97* 100 103 111 112*  CO2 13*   < > 23 25 25  21* 19*  GLUCOSE 273*   < > 170* 71 170* 173* 155*  BUN 38*   < > 31* 29* 26* 29* 27*  CREATININE 2.32*   < > 2.30* 2.16* 2.12* 2.15* 2.11*  CALCIUM 7.8*   < > 8.7* 8.4* 8.1* 8.2* 8.4*  MG 2.2  --   --  1.8 2.3 2.5*  --   PHOS  --   --   --  2.2* 2.6 2.1* 3.6   < > = values in this interval not displayed.    GFR: Estimated Creatinine Clearance: 28.4 mL/min (A) (by C-G formula based on SCr of 2.11 mg/dL (H)).  Liver Function Tests: Recent Labs  Lab 06/09/20 1301 06/12/20 0352 06/13/20 0340 06/14/20 1001 06/15/20 0410  AST 10*  --   --   --   --   ALT 8  --   --   --   --   ALKPHOS 56  --   --   --   --   BILITOT 1.2  --   --   --   --   PROT 7.0  --   --   --   --   ALBUMIN 3.3* 2.6* 2.5* 2.8* 2.7*    CBG: Recent Labs  Lab 06/12/20 2126 06/13/20 0745 06/13/20 1126 06/13/20 1634 06/13/20 2043  GLUCAP 164* 178* 156* 169* 162*     Recent Results (from the past 240 hour(s))  Blood Culture (routine x 2)     Status: Abnormal   Collection Time: 06/09/20  1:20 PM   Specimen: BLOOD  Result Value Ref Range Status   Specimen Description   Final    BLOOD RIGHT ANTECUBITAL Performed at [facility], [address], [place], [place] [address]    Special Requests   Final    BOTTLES DRAWN AEROBIC AND ANAEROBIC Blood Culture adequate volume Performed at [facility], [address] [place], [place] [address]    Culture  Setup Time   Final    AEROBIC BOTTLE ONLY GRAM POSITIVE COCCI Organism ID to follow CRITICAL RESULT CALLED TO, READ BACK BY AND VERIFIED WITH: MYRA SLAUGHTER 06/10/20 AT 1450 BY ACR GRAM STAIN REVIEWED-AGREE WITH RESULT     Culture (A)  Final    STAPHYLOCOCCUS CAPITIS THE SIGNIFICANCE OF ISOLATING THIS ORGANISM FROM A SINGLE SET OF BLOOD CULTURES WHEN MULTIPLE SETS ARE DRAWN IS UNCERTAIN. PLEASE NOTIFY THE MICROBIOLOGY DEPARTMENT WITHIN ONE WEEK IF SPECIATION AND SENSITIVITIES ARE REQUIRED. Performed at [facility], [place] [place], [place] [address]    Report Status 06/12/2020 FINAL  Final  Blood Culture (routine x 2)     Status: None   Collection Time: 06/09/20  1:20 PM   Specimen: BLOOD  Result Value Ref Range Status   Specimen Description BLOOD BLOOD RIGHT HAND  Final   Special Requests   Final    BOTTLES DRAWN AEROBIC AND ANAEROBIC Blood Culture adequate volume   Culture   Final    NO GROWTH 5 DAYS Performed at [facility], [address] [place], [place] [address]    Report Status 06/14/2020 FINAL  Final  Urine culture     Status: Abnormal   Collection Time: 06/09/20  1:20 PM   Specimen: In/Out Cath Urine  Result Value Ref Range Status   Specimen Description   Final    IN/OUT CATH URINE Performed at [facility], [address] [place], [place] [address]    Special Requests   Final    NONE Performed at [facility], [address] [place],  [address]    Culture (A)  Final    >=100,000 COLONIES/mL ESCHERICHIA COLI >=100,000 COLONIES/mL KLEBSIELLA PNEUMONIAE    Report Status 06/12/2020 FINAL  Final   Organism ID, Bacteria ESCHERICHIA COLI (A)  Final   Organism ID, Bacteria KLEBSIELLA PNEUMONIAE (A)  Final      Susceptibility   Escherichia coli - MIC*    AMPICILLIN >=32 RESISTANT Resistant     CEFAZOLIN <=4 SENSITIVE Sensitive     CEFTRIAXONE <=0.25 SENSITIVE Sensitive     CIPROFLOXACIN 2 INTERMEDIATE Intermediate     GENTAMICIN <=1 SENSITIVE Sensitive     IMIPENEM <=0.25 SENSITIVE Sensitive     NITROFURANTOIN <=16 SENSITIVE Sensitive     TRIMETH/SULFA <=20 SENSITIVE Sensitive     AMPICILLIN/SULBACTAM 4 SENSITIVE Sensitive      PIP/TAZO <=4 SENSITIVE Sensitive     * >=100,000 COLONIES/mL ESCHERICHIA COLI   Klebsiella pneumoniae - MIC*    AMPICILLIN >=32 RESISTANT Resistant     CEFAZOLIN <=4 SENSITIVE Sensitive     CEFTRIAXONE <=0.25 SENSITIVE Sensitive     CIPROFLOXACIN <=0.25 SENSITIVE Sensitive     GENTAMICIN <=1 SENSITIVE Sensitive     IMIPENEM <=0.25 SENSITIVE Sensitive     NITROFURANTOIN 64 INTERMEDIATE Intermediate     TRIMETH/SULFA <=20 SENSITIVE Sensitive     AMPICILLIN/SULBACTAM 8 SENSITIVE Sensitive     PIP/TAZO <=4 SENSITIVE Sensitive     * >=100,000 COLONIES/mL KLEBSIELLA PNEUMONIAE  SARS Coronavirus 2 by RT PCR (hospital order, performed in [place] hospital lab) Nasopharyngeal Nasopharyngeal Swab     Status: None   Collection Time: 06/09/20  1:20 PM   Specimen: Nasopharyngeal Swab  Result Value Ref Range Status   SARS Coronavirus 2 NEGATIVE NEGATIVE Final    Comment: (NOTE) SARS-CoV-2 target nucleic acids are NOT DETECTED.  The SARS-CoV-2 RNA is generally  detectable in upper and lower respiratory specimens during the acute phase of infection. The lowest concentration of SARS-CoV-2 viral copies this assay can detect is 250 copies / mL. A negative result does not preclude SARS-CoV-2 infection and should not be used as the sole basis for treatment or other patient management decisions.  A negative result may occur with improper specimen collection / handling, submission of specimen other than nasopharyngeal swab, presence of viral mutation(s) within the areas targeted by this assay, and inadequate number of viral copies (<250 copies / mL). A negative result must be combined with clinical observations, patient history, and epidemiological information.  Fact Sheet for Patients:   StrictlyIdeas.no  Fact Sheet for Healthcare Providers: BankingDealers.co.za  This test is not yet approved or  cleared by the [place] FDA and has been  authorized for detection and/or diagnosis of SARS-CoV-2 by FDA under an Emergency Use Authorization (EUA).  This EUA will remain in effect (meaning this test can be used) for the duration of the COVID-19 declaration under Section 564(b)(1) of the Act, 21 U.S.C. section 360bbb-3(b)(1), unless the authorization is terminated or revoked sooner.  Performed at [facility], [address] [place], [place] [address]   Blood Culture ID Panel (Reflexed)     Status: Abnormal   Collection Time: 06/09/20  1:20 PM  Result Value Ref Range Status   Enterococcus faecalis NOT DETECTED NOT DETECTED Final   Enterococcus Faecium NOT DETECTED NOT DETECTED Final   Listeria monocytogenes NOT DETECTED NOT DETECTED Final   Staphylococcus species DETECTED (A) NOT DETECTED Final    Comment: CRITICAL RESULT CALLED TO, READ BACK BY AND VERIFIED WITH: MYRA SLAUGHTER 06/10/20 AT 1450 BY AR    Staphylococcus aureus (BCID) NOT DETECTED NOT DETECTED Final   Staphylococcus epidermidis NOT DETECTED NOT DETECTED Final   Staphylococcus lugdunensis NOT DETECTED NOT DETECTED Final   Streptococcus species NOT DETECTED NOT DETECTED Final   Streptococcus agalactiae NOT DETECTED NOT DETECTED Final   Streptococcus pneumoniae NOT DETECTED NOT DETECTED Final   Streptococcus pyogenes NOT DETECTED NOT DETECTED Final   A.calcoaceticus-baumannii NOT DETECTED NOT DETECTED Final   Bacteroides fragilis NOT DETECTED NOT DETECTED Final   Enterobacterales NOT DETECTED NOT DETECTED Final   Enterobacter cloacae complex NOT DETECTED NOT DETECTED Final   Escherichia coli NOT DETECTED NOT DETECTED Final   Klebsiella aerogenes NOT DETECTED NOT DETECTED Final   Klebsiella oxytoca NOT DETECTED NOT DETECTED Final   Klebsiella pneumoniae NOT DETECTED NOT DETECTED Final   Proteus species NOT DETECTED NOT DETECTED Final   Salmonella species NOT DETECTED NOT DETECTED Final   Serratia marcescens NOT DETECTED NOT DETECTED Final    Haemophilus influenzae NOT DETECTED NOT DETECTED Final   Neisseria meningitidis NOT DETECTED NOT DETECTED Final   Pseudomonas aeruginosa NOT DETECTED NOT DETECTED Final   Stenotrophomonas maltophilia NOT DETECTED NOT DETECTED Final   Candida albicans NOT DETECTED NOT DETECTED Final   Candida auris NOT DETECTED NOT DETECTED Final   Candida glabrata NOT DETECTED NOT DETECTED Final   Candida krusei NOT DETECTED NOT DETECTED Final   Candida parapsilosis NOT DETECTED NOT DETECTED Final   Candida tropicalis NOT DETECTED NOT DETECTED Final   Cryptococcus neoformans/gattii NOT DETECTED NOT DETECTED Final    Comment: Performed at [facility], [address] [place], [place] [address]  CULTURE, BLOOD (ROUTINE X 2) w Reflex to ID Panel     Status: None   Collection Time: 06/10/20  5:35 PM   Specimen: BLOOD  Result Value Ref Range Status   Specimen Description BLOOD LEFT ANTECUBITAL  Final   Special Requests   Final    BOTTLES DRAWN AEROBIC AND ANAEROBIC Blood Culture adequate volume   Culture   Final    NO GROWTH 5 DAYS Performed at [facility], [address] [place], [place] [address]    Report Status 06/15/2020 FINAL  Final  CULTURE, BLOOD (ROUTINE X 2) w Reflex to ID Panel     Status: None   Collection Time: 06/10/20  5:41 PM   Specimen: BLOOD  Result Value Ref Range Status   Specimen Description BLOOD BLOOD LEFT HAND  Final   Special Requests   Final    BOTTLES DRAWN AEROBIC ONLY Blood Culture adequate volume   Culture   Final    NO GROWTH 5 DAYS Performed at [facility], [address], [place], [place] [address]    Report Status 06/15/2020 FINAL  Final         Radiology Studies: No results found.      Scheduled Meds:  benztropine  0.5 mg Oral BID   calcium carbonate  1 tablet Oral TID WC   dronabinol  2.5 mg Oral BID AC   DULoxetine  30 mg Oral Daily   enoxaparin (LOVENOX) injection  30 mg Subcutaneous Q24H   feeding  supplement (ENSURE ENLIVE)  237 mL Oral BID BM   feeding supplement (GLUCERNA SHAKE)  237 mL Oral BID BM   feeding supplement (GLUCERNA SHAKE)  237 mL Oral TID BM   ferrous sulfate  325 mg Oral BID WC   haloperidol  0.5 mg Oral BID   insulin aspart  0-9 Units Subcutaneous TID WC   insulin glargine  5 Units Subcutaneous QHS   multivitamin with minerals  1 tablet Oral Daily   pantoprazole  40 mg Oral Daily   Ensure Max Protein  11 oz Oral Daily   scopolamine  1 patch Transdermal Q72H   sodium bicarbonate  1,300 mg Oral BID   sodium chloride flush  3 mL Intravenous Q12H   Continuous Infusions:   ceFAZolin (ANCEF) IV 1 g (06/14/20 2103)     LOS: 5 days    Time spent: 40 minutes    [name], MD [facility]   To contact the attending provider between 7A-7P or the covering provider during after hours 7P-7A, please log into the web site [email or web address] and access using universal [place] password for that web site. If you do not have the password, please call the hospital operator.  06/15/2020, 12:27 PM

## 2020-06-15 NOTE — Progress Notes (Signed)
Spoke with Gregory Moody pt with fbs of 494. Per NP give 5 units of the novolog along with his 5 units of Lantus.

## 2020-06-16 DIAGNOSIS — E875 Hyperkalemia: Secondary | ICD-10-CM

## 2020-06-16 LAB — CBC WITH DIFFERENTIAL/PLATELET
Abs Immature Granulocytes: 0.02 10*3/uL (ref 0.00–0.07)
Basophils Absolute: 0 10*3/uL (ref 0.0–0.1)
Basophils Relative: 1 %
Eosinophils Absolute: 0.2 10*3/uL (ref 0.0–0.5)
Eosinophils Relative: 3 %
HCT: 25.1 % — ABNORMAL LOW (ref 39.0–52.0)
Hemoglobin: 8.1 g/dL — ABNORMAL LOW (ref 13.0–17.0)
Immature Granulocytes: 0 %
Lymphocytes Relative: 47 %
Lymphs Abs: 3.1 10*3/uL (ref 0.7–4.0)
MCH: 29.7 pg (ref 26.0–34.0)
MCHC: 32.3 g/dL (ref 30.0–36.0)
MCV: 91.9 fL (ref 80.0–100.0)
Monocytes Absolute: 0.3 10*3/uL (ref 0.1–1.0)
Monocytes Relative: 5 %
Neutro Abs: 2.9 10*3/uL (ref 1.7–7.7)
Neutrophils Relative %: 44 %
Platelets: 223 10*3/uL (ref 150–400)
RBC: 2.73 MIL/uL — ABNORMAL LOW (ref 4.22–5.81)
RDW: 16.2 % — ABNORMAL HIGH (ref 11.5–15.5)
WBC: 6.5 10*3/uL (ref 4.0–10.5)
nRBC: 0 % (ref 0.0–0.2)

## 2020-06-16 LAB — RENAL FUNCTION PANEL
Albumin: 2.7 g/dL — ABNORMAL LOW (ref 3.5–5.0)
Anion gap: 7 (ref 5–15)
BUN: 26 mg/dL — ABNORMAL HIGH (ref 6–20)
CO2: 18 mmol/L — ABNORMAL LOW (ref 22–32)
Calcium: 8.7 mg/dL — ABNORMAL LOW (ref 8.9–10.3)
Chloride: 108 mmol/L (ref 98–111)
Creatinine, Ser: 2.01 mg/dL — ABNORMAL HIGH (ref 0.61–1.24)
GFR calc Af Amer: 45 mL/min — ABNORMAL LOW (ref >60)
GFR calc non Af Amer: 39 mL/min — ABNORMAL LOW (ref >60)
Glucose, Bld: 164 mg/dL — ABNORMAL HIGH (ref 70–99)
Phosphorus: 2.6 mg/dL (ref 2.5–4.6)
Potassium: 5.4 mmol/L — ABNORMAL HIGH (ref 3.5–5.1)
Sodium: 133 mmol/L — ABNORMAL LOW (ref 135–145)

## 2020-06-16 LAB — BASIC METABOLIC PANEL
Anion gap: 4 — ABNORMAL LOW (ref 5–15)
BUN: 25 mg/dL — ABNORMAL HIGH (ref 6–20)
CO2: 20 mmol/L — ABNORMAL LOW (ref 22–32)
Calcium: 8.4 mg/dL — ABNORMAL LOW (ref 8.9–10.3)
Chloride: 108 mmol/L (ref 98–111)
Creatinine, Ser: 1.9 mg/dL — ABNORMAL HIGH (ref 0.61–1.24)
GFR calc Af Amer: 48 mL/min — ABNORMAL LOW (ref >60)
GFR calc non Af Amer: 42 mL/min — ABNORMAL LOW (ref >60)
Glucose, Bld: 186 mg/dL — ABNORMAL HIGH (ref 70–99)
Potassium: 6.1 mmol/L — ABNORMAL HIGH (ref 3.5–5.1)
Sodium: 132 mmol/L — ABNORMAL LOW (ref 135–145)

## 2020-06-16 MED ORDER — SODIUM ZIRCONIUM CYCLOSILICATE 10 G PO PACK
10.0000 g | PACK | Freq: Two times a day (BID) | ORAL | Status: DC
  Administered 2020-06-16: 10 g via ORAL
  Filled 2020-06-16 (×2): qty 1

## 2020-06-16 MED ORDER — SODIUM POLYSTYRENE SULFONATE 15 GM/60ML PO SUSP
45.0000 g | Freq: Once | ORAL | Status: AC
  Administered 2020-06-16: 45 g via ORAL
  Filled 2020-06-16: qty 180

## 2020-06-16 MED ORDER — SODIUM CHLORIDE 0.9 % IV BOLUS: 500.0000 mL | Freq: Once | INTRAVENOUS | Status: DC

## 2020-06-16 MED ORDER — LOPERAMIDE HCL 2 MG PO CAPS
4.0000 mg | ORAL_CAPSULE | Freq: Once | ORAL | Status: AC
  Administered 2020-06-16: 4 mg via ORAL
  Filled 2020-06-16 (×2): qty 2

## 2020-06-16 NOTE — Progress Notes (Signed)
PROGRESS NOTE    Gregory Moody  BTY:[id number] DOB: 1976/08/11 DOA: 06/09/2020 PCP: [facility]   Chief Complaint  Patient presents with  . Weakness    Brief Narrative:  HPI per Dr. Jimmye Moody 44 y/o M w/ PMH of DM2, chronic urinary retention s/p suprapubic catheter, possible depression who presents w/ weakness & nausea x 1 week. Pt is a poor historian. Pt c/o difficulty walking & no appetite for 1 week. Pt does use a walker. Pt denies any sick contacts. Pt denies getting COVID19 vaccine. Also, pt c/o diarrhea daily x 2-3 years. Pt has approx 2-3 episodes of diarrhea daily. Pt denies any bright red blood or black stools. Of note, pt is allergic to milk. Pt denies any fevers, chills, sweating, cough, chest pain, shortness of breath, abd pain, or constipation. In the ED patient noted to be hypothermic and hypotensive and placed on IV fluids, patient pancultured.  Patient did receive a dose of IV antibiotics in the ED.   Assessment & Plan:   Principal Problem:   Sepsis secondary to UTI [facility]) Active Problems:   E-coli UTI   UTI due to Klebsiella species   GERD (gastroesophageal reflux disease)   Hypotension   Anemia of chronic disease   Protein-calorie malnutrition, severe   Diarrhea   Urinary retention   Hyperglycemia due to type 2 diabetes mellitus (HCC)   Generalized weakness   Hyperkalemia   Encephalopathy   Hypothermia   Hyponatremia  1 severe sepsis with acute organ dysfunction (hypotension) secondary to E. coli and Klebsiella pneumonia , POA Patient had presented with generalized weakness noted to be hypothermic and hypotensive on admission.  Blood pressure on presentation responded to IV fluids.  Patient pancultured.  Chest x-ray done negative for any acute abnormalities.  Patient with chronic suprapubic catheter.  Urine cultures positive for E. coli and Klebsiella pneumoniae.  Blood pressure improved with hydration.  Procalcitonin < 0.1.  UDS  negative.  Alcohol level was less than 10.  CT head which was done showed no acute intracranial findings but atrophy present and advanced for age.  Patient with improvement with systolic blood pressure.  TSH within normal limits.  Saline lock IV fluids.  Diflucan has been discontinued.  He was on IV Rocephin and transitioned to IV cefazolin.  IV cefazolin has been transitioned to oral Keflex to complete a 10 to 14-day course of antibiotic treatment.  Status post suprapubic catheter exchange 06/12/2020.  Urology PA was following.  Will need outpatient follow-up with urology and PCP.   2.  Generalized weakness Questionable etiology.  Likely secondary to urosepsis in the setting of poor oral intake.Patient pancultured.  TSH within normal limits.  Random cortisol was 9.1.  Urine cultures positive for E. coli and Klebsiella pneumonia.  Improving clinically.  Patient now on oral antibiotics.  PT/OT.  Home health therapies on discharge.   3.  Metabolic acidosis Continue bicarb tablets.  Follow.   4.  Poorly controlled diabetes mellitus type 2 Hemoglobin A1c 8.3(05/02/2020).  Blood glucose levels of 186 on morning labs.  Continue Lantus 5 units daily.  Sliding scale insulin.   5.  Chronic urinary retention status post suprapubic catheter Catheter last changed 06/06/2020 per patient however per urology PA patient missed appointment and suprapubic catheter has not been changed and is due to be changed 06/13/2020, in the outpatient setting.  Urinalysis on admission was turbid, large leukocytes, nitrite negative, greater than 50 WBCs, many bacteria, budding yeast present.  Urine cultures with  greater than one hundred thousand colonies of E. coli and Klebsiella pneumoniae.  Some budding yeast also noted in the urine.  Urine cultures did not grow any yeast.  Patient started on Diflucan empirically initially which was subsequently discontinued as urine cultures were finalized with no growth.  Patient was on IV Rocephin and  transitioned to IV Ancef.  Patient has been transitioned from IV Ancef to oral Keflex.  Follow.    6.  Hyponatremia Likely secondary to hypovolemic hyponatremia.  Improved with hydration.  TSH within normal limits at 1.462.  Random cortisol level at 9.1.  Sodium at 132.  IV fluids saline lock.  Follow.    7.  Chronic diarrhea Stool cultures ordered and currently pending.  Imodium as needed.  Outpatient follow-up.  8.  Severe protein calorie malnutrition Nutrition consulted.  Continue nutritional supplementation.   9.  chronic kidney disease stage IIIb Renal function seems to be close to baseline.  Creatinine currently at 2.01.  Bicarb tablets.  Blood pressure noted to be borderline this morning and since we will give a bolus of normal saline 500 cc x 1.  IV fluids saline lock.  Follow.   10.  Gastroesophageal reflux disease PPI.  11.  Depression Stable.  Continue Cymbalta, Haldol, Cogentin.  Outpatient follow-up.   12.  Anemia of chronic disease Hemoglobin currently stable at 8.1.  Likely dilutional effect.  IV fluids have been saline lock.  Continue oral iron supplementation.  Follow.  13.  Hyperkalemia Questionable etiology.  Patient noted to have a potassium of 5.4 this morning.  Patient not on oral potassium supplementation.  Renal function improving.  Change diet to a renal diet.  Placed on Lokelma twice daily and repeat be met in the afternoon.  If potassium is still elevated we will give Kayexalate.  Follow.   DVT prophylaxis: Lovenox Code Status: Full Family Communication: Updated patient.  No family at bedside. Disposition:   Status is: Inpatient    Dispo: The patient is from: Home              Anticipated d/c is to: Home with home health services               Anticipated d/c date is: 1 to 2 days.              Patient currently with sepsis secondary to UTI with ongoing hypotension slowly responding to IV fluids, generalized weakness.  On IV antibiotics.  Being  transitioned to oral antibiotics.  Patient noted to be hyperkalemic this morning.  Not stable for discharge.         Consultants:   Urology: Dr. Diamantina Providence 06/13/2020  Procedures:   CT head 06/09/2020  CT abdomen and pelvis 06/09/2020  Chest x-ray 06/09/2020  Suprapubic catheter exchange under fluoroscopy per interventional radiology, Dr. Kathlene Cote 06/13/2020  Antimicrobials:  IV Rocephin 06/11/2020>>>> 06/13/2020  Diflucan 06/11/2020>>>>> 06/12/2020   IV cefazolin 06/13/2020>>>> 06/15/2020  Keflex 06/15/2020   Subjective: Sitting up in bed.  Some complaints of some dizziness.  Denies any chest pain.  No abdominal pain.  Tolerating current diet.   Objective: Vitals:   06/15/20 1546 06/15/20 2313 06/16/20 0738 06/16/20 0800  BP: 95/62 114/82 (!) 83/66 (!) 90/50  Pulse: 86 81 75   Resp: '17 18 14   ' Temp: 97.8 F (36.6 C) 97.8 F (36.6 C) 98.7 F (37.1 C)   TempSrc:  Oral Oral   SpO2: 100% 100% 100%   Weight:  Height:        Intake/Output Summary (Last 24 hours) at 06/16/2020 0922 Last data filed at 06/16/2020 0501 Gross per 24 hour  Intake 960 ml  Output 2001 ml  Net -1041 ml   Filed Weights   06/09/20 1251  Weight: 45.4 kg    Examination:  General exam: Frail.  Chronically ill-appearing.  Emaciated.  Cachectic.   Respiratory system: Lungs clear to auscultation bilaterally.  No wheezes, no crackles, no rhonchi.  Normal respiratory effort.  Cardiovascular system: RRR no murmurs rubs or gallops.  No JVD.  No lower extremity edema.  Gastrointestinal system: Abdomen is soft, nontender, nondistended, positive bowel sounds.  No rebound.  No guarding. Gu: Suprapubic catheter in place Central nervous system: Alert and oriented. No focal neurological deficits. Extremities: Symmetric 5 x 5 power. Skin: No rashes, lesions or ulcers Psychiatry: Judgement and insight appear normal. Mood & affect appropriate.     Data Reviewed: I have personally reviewed following labs and imaging  studies  CBC: Recent Labs  Lab 06/11/20 0518 06/11/20 0518 06/12/20 0352 06/13/20 0340 06/14/20 1001 06/15/20 0410 06/16/20 0355  WBC 5.5  --  5.8 5.6 5.0  --  6.5  NEUTROABS  --   --  3.2 3.0 2.9  --  2.9  HGB 9.1*   < > 8.8* 7.9* 8.0* 8.0* 8.1*  HCT 26.0*   < > 24.6* 23.9* 24.6* 23.5* 25.1*  MCV 86.4  --  84.8 90.9 92.8  --  91.9  PLT 227  --  207 215 224  --  223   < > = values in this interval not displayed.    Basic Metabolic Panel: Recent Labs  Lab 06/09/20 1320 06/09/20 2357 06/12/20 0352 06/13/20 0340 06/14/20 1001 06/15/20 0410 06/16/20 0355  NA 128*   < > 132* 131* 137 135 133*  K 4.1   < > 3.9 4.5 4.9 5.2* 5.4*  CL 105   < > 100 103 111 112* 108  CO2 13*   < > 25 25 21* 19* 18*  GLUCOSE 273*   < > 71 170* 173* 155* 164*  BUN 38*   < > 29* 26* 29* 27* 26*  CREATININE 2.32*   < > 2.16* 2.12* 2.15* 2.11* 2.01*  CALCIUM 7.8*   < > 8.4* 8.1* 8.2* 8.4* 8.7*  MG 2.2  --  1.8 2.3 2.5*  --   --   PHOS  --   --  2.2* 2.6 2.1* 3.6 2.6   < > = values in this interval not displayed.    GFR: Estimated Creatinine Clearance: 29.8 mL/min (A) (by C-G formula based on SCr of 2.01 mg/dL (H)).  Liver Function Tests: Recent Labs  Lab 06/09/20 1301 06/09/20 1301 06/12/20 0352 06/13/20 0340 06/14/20 1001 06/15/20 0410 06/16/20 0355  AST 10*  --   --   --   --   --   --   ALT 8  --   --   --   --   --   --   ALKPHOS 56  --   --   --   --   --   --   BILITOT 1.2  --   --   --   --   --   --   PROT 7.0  --   --   --   --   --   --   ALBUMIN 3.3*   < > 2.6* 2.5* 2.8* 2.7* 2.7*   < > =  values in this interval not displayed.    CBG: Recent Labs  Lab 06/12/20 2126 06/13/20 0745 06/13/20 1126 06/13/20 1634 06/13/20 2043  GLUCAP 164* 178* 156* 169* 162*     Recent Results (from the past 240 hour(s))  Blood Culture (routine x 2)     Status: Abnormal   Collection Time: 06/09/20  1:20 PM   Specimen: BLOOD  Result Value Ref Range Status   Specimen Description    Final    BLOOD RIGHT ANTECUBITAL Performed at [facility], [address], [place], [place] [address]    Special Requests   Final    BOTTLES DRAWN AEROBIC AND ANAEROBIC Blood Culture adequate volume Performed at [facility], [address], [place], [place] [address]    Culture  Setup Time   Final    AEROBIC BOTTLE ONLY GRAM POSITIVE COCCI Organism ID to follow CRITICAL RESULT CALLED TO, READ BACK BY AND VERIFIED WITH: MYRA SLAUGHTER 06/10/20 AT 1450 BY ACR GRAM STAIN REVIEWED-AGREE WITH RESULT    Culture (A)  Final    STAPHYLOCOCCUS CAPITIS THE SIGNIFICANCE OF ISOLATING THIS ORGANISM FROM A SINGLE SET OF BLOOD CULTURES WHEN MULTIPLE SETS ARE DRAWN IS UNCERTAIN. PLEASE NOTIFY THE MICROBIOLOGY DEPARTMENT WITHIN ONE WEEK IF SPECIATION AND SENSITIVITIES ARE REQUIRED. Performed at [facility], [place] [address], [place], [place] [address]    Report Status 06/12/2020 FINAL  Final  Blood Culture (routine x 2)     Status: None   Collection Time: 06/09/20  1:20 PM   Specimen: BLOOD  Result Value Ref Range Status   Specimen Description BLOOD BLOOD RIGHT HAND  Final   Special Requests   Final    BOTTLES DRAWN AEROBIC AND ANAEROBIC Blood Culture adequate volume   Culture   Final    NO GROWTH 5 DAYS Performed at [facility], [address] [place], [place] [address]    Report Status 06/14/2020 FINAL  Final  Urine culture     Status: Abnormal   Collection Time: 06/09/20  1:20 PM   Specimen: In/Out Cath Urine  Result Value Ref Range Status   Specimen Description   Final    IN/OUT CATH URINE Performed at [facility], [address] [place], [place] [address]    Special Requests   Final    NONE Performed at [facility], [address] [place], [place] [address]    Culture (A)  Final    >=100,000 COLONIES/mL ESCHERICHIA COLI >=100,000 COLONIES/mL KLEBSIELLA PNEUMONIAE    Report Status 06/12/2020 FINAL  Final   Organism  ID, Bacteria ESCHERICHIA COLI (A)  Final   Organism ID, Bacteria KLEBSIELLA PNEUMONIAE (A)  Final      Susceptibility   Escherichia coli - MIC*    AMPICILLIN >=32 RESISTANT Resistant     CEFAZOLIN <=4 SENSITIVE Sensitive     CEFTRIAXONE <=0.25 SENSITIVE Sensitive     CIPROFLOXACIN 2 INTERMEDIATE Intermediate     GENTAMICIN <=1 SENSITIVE Sensitive     IMIPENEM <=0.25 SENSITIVE Sensitive     NITROFURANTOIN <=16 SENSITIVE Sensitive     TRIMETH/SULFA <=20 SENSITIVE Sensitive     AMPICILLIN/SULBACTAM 4 SENSITIVE Sensitive     PIP/TAZO <=4 SENSITIVE Sensitive     * >=100,000 COLONIES/mL ESCHERICHIA COLI   Klebsiella pneumoniae - MIC*    AMPICILLIN >=32 RESISTANT Resistant     CEFAZOLIN <=4 SENSITIVE Sensitive     CEFTRIAXONE <=0.25 SENSITIVE Sensitive     CIPROFLOXACIN <=0.25 SENSITIVE Sensitive  GENTAMICIN <=1 SENSITIVE Sensitive     IMIPENEM <=0.25 SENSITIVE Sensitive     NITROFURANTOIN 64 INTERMEDIATE Intermediate     TRIMETH/SULFA <=20 SENSITIVE Sensitive     AMPICILLIN/SULBACTAM 8 SENSITIVE Sensitive     PIP/TAZO <=4 SENSITIVE Sensitive     * >=100,000 COLONIES/mL KLEBSIELLA PNEUMONIAE  SARS Coronavirus 2 by RT PCR (hospital order, performed in [facility] hospital lab) Nasopharyngeal Nasopharyngeal Swab     Status: None   Collection Time: 06/09/20  1:20 PM   Specimen: Nasopharyngeal Swab  Result Value Ref Range Status   SARS Coronavirus 2 NEGATIVE NEGATIVE Final    Comment: (NOTE) SARS-CoV-2 target nucleic acids are NOT DETECTED.  The SARS-CoV-2 RNA is generally detectable in upper and lower respiratory specimens during the acute phase of infection. The lowest concentration of SARS-CoV-2 viral copies this assay can detect is 250 copies / mL. A negative result does not preclude SARS-CoV-2 infection and should not be used as the sole basis for treatment or other patient management decisions.  A negative result may occur with improper specimen collection / handling,  submission of specimen other than nasopharyngeal swab, presence of viral mutation(s) within the areas targeted by this assay, and inadequate number of viral copies (<250 copies / mL). A negative result must be combined with clinical observations, patient history, and epidemiological information.  Fact Sheet for Patients:   StrictlyIdeas.no  Fact Sheet for Healthcare Providers: BankingDealers.co.za  This test is not yet approved or  cleared by the [place] FDA and has been authorized for detection and/or diagnosis of SARS-CoV-2 by FDA under an Emergency Use Authorization (EUA).  This EUA will remain in effect (meaning this test can be used) for the duration of the COVID-19 declaration under Section 564(b)(1) of the Act, 21 U.S.C. section 360bbb-3(b)(1), unless the authorization is terminated or revoked sooner.  Performed at [facility], [address] [place], [place] [address]   Blood Culture ID Panel (Reflexed)     Status: Abnormal   Collection Time: 06/09/20  1:20 PM  Result Value Ref Range Status   Enterococcus faecalis NOT DETECTED NOT DETECTED Final   Enterococcus Faecium NOT DETECTED NOT DETECTED Final   Listeria monocytogenes NOT DETECTED NOT DETECTED Final   Staphylococcus species DETECTED (A) NOT DETECTED Final    Comment: CRITICAL RESULT CALLED TO, READ BACK BY AND VERIFIED WITH: MYRA SLAUGHTER 06/10/20 AT 1450 BY AR    Staphylococcus aureus (BCID) NOT DETECTED NOT DETECTED Final   Staphylococcus epidermidis NOT DETECTED NOT DETECTED Final   Staphylococcus lugdunensis NOT DETECTED NOT DETECTED Final   Streptococcus species NOT DETECTED NOT DETECTED Final   Streptococcus agalactiae NOT DETECTED NOT DETECTED Final   Streptococcus pneumoniae NOT DETECTED NOT DETECTED Final   Streptococcus pyogenes NOT DETECTED NOT DETECTED Final   A.calcoaceticus-baumannii NOT DETECTED NOT DETECTED Final   Bacteroides  fragilis NOT DETECTED NOT DETECTED Final   Enterobacterales NOT DETECTED NOT DETECTED Final   Enterobacter cloacae complex NOT DETECTED NOT DETECTED Final   Escherichia coli NOT DETECTED NOT DETECTED Final   Klebsiella aerogenes NOT DETECTED NOT DETECTED Final   Klebsiella oxytoca NOT DETECTED NOT DETECTED Final   Klebsiella pneumoniae NOT DETECTED NOT DETECTED Final   Proteus species NOT DETECTED NOT DETECTED Final   Salmonella species NOT DETECTED NOT DETECTED Final   Serratia marcescens NOT DETECTED NOT DETECTED Final   Haemophilus influenzae NOT DETECTED NOT DETECTED Final   Neisseria meningitidis NOT DETECTED NOT DETECTED Final   Pseudomonas aeruginosa NOT  DETECTED NOT DETECTED Final   Stenotrophomonas maltophilia NOT DETECTED NOT DETECTED Final   Candida albicans NOT DETECTED NOT DETECTED Final   Candida auris NOT DETECTED NOT DETECTED Final   Candida glabrata NOT DETECTED NOT DETECTED Final   Candida krusei NOT DETECTED NOT DETECTED Final   Candida parapsilosis NOT DETECTED NOT DETECTED Final   Candida tropicalis NOT DETECTED NOT DETECTED Final   Cryptococcus neoformans/gattii NOT DETECTED NOT DETECTED Final    Comment: Performed at [facility], [address] [place], [place] [address]  CULTURE, BLOOD (ROUTINE X 2) w Reflex to ID Panel     Status: None   Collection Time: 06/10/20  5:35 PM   Specimen: BLOOD  Result Value Ref Range Status   Specimen Description BLOOD LEFT ANTECUBITAL  Final   Special Requests   Final    BOTTLES DRAWN AEROBIC AND ANAEROBIC Blood Culture adequate volume   Culture   Final    NO GROWTH 5 DAYS Performed at [facility], [address] [place], [place] [address]    Report Status 06/15/2020 FINAL  Final  CULTURE, BLOOD (ROUTINE X 2) w Reflex to ID Panel     Status: None   Collection Time: 06/10/20  5:41 PM   Specimen: BLOOD  Result Value Ref Range Status   Specimen Description BLOOD BLOOD LEFT HAND  Final   Special  Requests   Final    BOTTLES DRAWN AEROBIC ONLY Blood Culture adequate volume   Culture   Final    NO GROWTH 5 DAYS Performed at [facility], [address] [place], [place] [address]    Report Status 06/15/2020 FINAL  Final         Radiology Studies: No results found.      Scheduled Meds: . benztropine  0.5 mg Oral BID  . calcium carbonate  1 tablet Oral TID WC  . cephALEXin  500 mg Oral Q8H  . dronabinol  2.5 mg Oral BID AC  . DULoxetine  30 mg Oral Daily  . enoxaparin (LOVENOX) injection  30 mg Subcutaneous Q24H  . feeding supplement (ENSURE ENLIVE)  237 mL Oral BID BM  . feeding supplement (GLUCERNA SHAKE)  237 mL Oral BID BM  . feeding supplement (GLUCERNA SHAKE)  237 mL Oral TID BM  . ferrous sulfate  325 mg Oral BID WC  . haloperidol  0.5 mg Oral BID  . insulin aspart  0-15 Units Subcutaneous TID WC  . insulin aspart  0-5 Units Subcutaneous QHS  . insulin glargine  5 Units Subcutaneous QHS  . multivitamin with minerals  1 tablet Oral Daily  . pantoprazole  40 mg Oral Daily  . Ensure Max Protein  11 oz Oral Daily  . scopolamine  1 patch Transdermal Q72H  . sodium bicarbonate  1,300 mg Oral BID  . sodium chloride flush  3 mL Intravenous Q12H  . sodium zirconium cyclosilicate  10 g Oral BID   Continuous Infusions:    LOS: 6 days    Time spent: 40 minutes    [name], MD [facility]   To contact the attending provider between 7A-7P or the covering provider during after hours 7P-7A, please log into the web site [email or web address] and access using universal [place] password for that web site. If you do not have the password, please call the hospital operator.  06/16/2020, 9:22 AM

## 2020-06-16 NOTE — Progress Notes (Signed)
Patients BS 127mg /dl did not flow over but it is on glucometer . BP obtained left arm 90/50 while sitting on [facility]

## 2020-06-16 NOTE — Progress Notes (Signed)
Patients BS 223 , 5 units given per [facility]

## 2020-06-16 NOTE — Progress Notes (Signed)
Patients BS 119 , still not flowing to Lab results

## 2020-06-16 NOTE — Progress Notes (Signed)
   06/16/20 1537  Vitals  Temp 98.4 F (36.9 C)  Temp Source Oral  BP 124/81  MAP (mmHg) 94  BP Location Left Arm  BP Method Automatic  Patient Position (if appropriate) Lying  Pulse Rate 86  Resp 14  MEWS COLOR  MEWS Score Color Green  Oxygen Therapy  SpO2 100 %  O2 Device Room Air  MEWS Score  MEWS Temp 0  MEWS Systolic 0  MEWS Pulse 0  MEWS RR 0  MEWS LOC 0  MEWS Score 0

## 2020-06-16 NOTE — Progress Notes (Signed)
Patients IV is infiltrated to RFA. Notified Dr. [place] Silos and placed IV consult

## 2020-06-16 NOTE — Plan of Care (Signed)

## 2020-06-17 DIAGNOSIS — E875 Hyperkalemia: Secondary | ICD-10-CM

## 2020-06-17 LAB — CBC WITH DIFFERENTIAL/PLATELET
Abs Immature Granulocytes: 0.01 10*3/uL (ref 0.00–0.07)
Basophils Absolute: 0 10*3/uL (ref 0.0–0.1)
Basophils Relative: 0 %
Eosinophils Absolute: 0.3 10*3/uL (ref 0.0–0.5)
Eosinophils Relative: 5 %
HCT: 26 % — ABNORMAL LOW (ref 39.0–52.0)
Hemoglobin: 8.4 g/dL — ABNORMAL LOW (ref 13.0–17.0)
Immature Granulocytes: 0 %
Lymphocytes Relative: 38 %
Lymphs Abs: 2 10*3/uL (ref 0.7–4.0)
MCH: 30.4 pg (ref 26.0–34.0)
MCHC: 32.3 g/dL (ref 30.0–36.0)
MCV: 94.2 fL (ref 80.0–100.0)
Monocytes Absolute: 0.3 10*3/uL (ref 0.1–1.0)
Monocytes Relative: 5 %
Neutro Abs: 2.7 10*3/uL (ref 1.7–7.7)
Neutrophils Relative %: 52 %
Platelets: 244 10*3/uL (ref 150–400)
RBC: 2.76 MIL/uL — ABNORMAL LOW (ref 4.22–5.81)
RDW: 16.6 % — ABNORMAL HIGH (ref 11.5–15.5)
WBC: 5.3 10*3/uL (ref 4.0–10.5)
nRBC: 0 % (ref 0.0–0.2)

## 2020-06-17 LAB — NA AND K (SODIUM & POTASSIUM), RAND UR
Potassium Urine: 35 mmol/L
Sodium, Ur: 77 mmol/L

## 2020-06-17 LAB — RENAL FUNCTION PANEL
Albumin: 2.8 g/dL — ABNORMAL LOW (ref 3.5–5.0)
Anion gap: 8 (ref 5–15)
BUN: 30 mg/dL — ABNORMAL HIGH (ref 6–20)
CO2: 20 mmol/L — ABNORMAL LOW (ref 22–32)
Calcium: 8.6 mg/dL — ABNORMAL LOW (ref 8.9–10.3)
Chloride: 109 mmol/L (ref 98–111)
Creatinine, Ser: 2.03 mg/dL — ABNORMAL HIGH (ref 0.61–1.24)
GFR calc Af Amer: 45 mL/min — ABNORMAL LOW (ref >60)
GFR calc non Af Amer: 38 mL/min — ABNORMAL LOW (ref >60)
Glucose, Bld: 198 mg/dL — ABNORMAL HIGH (ref 70–99)
Phosphorus: 3.2 mg/dL (ref 2.5–4.6)
Potassium: 6.4 mmol/L (ref 3.5–5.1)
Sodium: 137 mmol/L (ref 135–145)

## 2020-06-17 LAB — BASIC METABOLIC PANEL
Anion gap: 10 (ref 5–15)
BUN: 27 mg/dL — ABNORMAL HIGH (ref 6–20)
CO2: 21 mmol/L — ABNORMAL LOW (ref 22–32)
Calcium: 8.2 mg/dL — ABNORMAL LOW (ref 8.9–10.3)
Chloride: 107 mmol/L (ref 98–111)
Creatinine, Ser: 1.86 mg/dL — ABNORMAL HIGH (ref 0.61–1.24)
GFR calc Af Amer: 50 mL/min — ABNORMAL LOW (ref >60)
GFR calc non Af Amer: 43 mL/min — ABNORMAL LOW (ref >60)
Glucose, Bld: 199 mg/dL — ABNORMAL HIGH (ref 70–99)
Potassium: 4 mmol/L (ref 3.5–5.1)
Sodium: 138 mmol/L (ref 135–145)

## 2020-06-17 LAB — GLUCOSE, CAPILLARY
Glucose-Capillary: 114 mg/dL — ABNORMAL HIGH (ref 70–99)
Glucose-Capillary: 218 mg/dL — ABNORMAL HIGH (ref 70–99)
Glucose-Capillary: 125 mg/dL — ABNORMAL HIGH (ref 70–99)
Glucose-Capillary: 211 mg/dL — ABNORMAL HIGH (ref 70–99)
Glucose-Capillary: 226 mg/dL — ABNORMAL HIGH (ref 70–99)
Glucose-Capillary: 134 mg/dL — ABNORMAL HIGH (ref 70–99)
Glucose-Capillary: 174 mg/dL — ABNORMAL HIGH (ref 70–99)
Glucose-Capillary: 278 mg/dL — ABNORMAL HIGH (ref 70–99)
Glucose-Capillary: 494 mg/dL — ABNORMAL HIGH (ref 70–99)
Glucose-Capillary: 231 mg/dL — ABNORMAL HIGH (ref 70–99)
Glucose-Capillary: 127 mg/dL — ABNORMAL HIGH (ref 70–99)
Glucose-Capillary: 119 mg/dL — ABNORMAL HIGH (ref 70–99)
Glucose-Capillary: 223 mg/dL — ABNORMAL HIGH (ref 70–99)
Glucose-Capillary: 204 mg/dL — ABNORMAL HIGH (ref 70–99)
Glucose-Capillary: 224 mg/dL — ABNORMAL HIGH (ref 70–99)
Glucose-Capillary: 178 mg/dL — ABNORMAL HIGH (ref 70–99)

## 2020-06-17 LAB — OSMOLALITY, URINE: Osmolality, Ur: 387 mOsm/kg (ref 300–900)

## 2020-06-17 LAB — CREATININE, URINE, RANDOM: Creatinine, Urine: 47 mg/dL

## 2020-06-17 MED ORDER — NEPRO/CARBSTEADY PO LIQD
237.0000 mL | Freq: Two times a day (BID) | ORAL | Status: DC
  Administered 2020-06-17 – 2020-06-19 (×5): 237 mL via ORAL

## 2020-06-17 MED ORDER — SODIUM POLYSTYRENE SULFONATE 15 GM/60ML PO SUSP
45.0000 g | Freq: Once | ORAL | Status: AC
  Administered 2020-06-17: 45 g via ORAL
  Filled 2020-06-17: qty 180

## 2020-06-17 MED ORDER — INSULIN ASPART 100 UNIT/ML IV SOLN
5.0000 [IU] | Freq: Once | INTRAVENOUS | Status: AC
  Administered 2020-06-17: 5 [IU] via INTRAVENOUS
  Filled 2020-06-17: qty 0.05

## 2020-06-17 MED ORDER — SODIUM BICARBONATE 8.4 % IV SOLN
50.0000 meq | Freq: Once | INTRAVENOUS | Status: AC
  Administered 2020-06-17: 50 meq via INTRAVENOUS
  Filled 2020-06-17: qty 50

## 2020-06-17 MED ORDER — DEXTROSE 50 % IV SOLN
1.0000 | Freq: Once | INTRAVENOUS | Status: AC
  Administered 2020-06-17: 50 mL via INTRAVENOUS
  Filled 2020-06-17: qty 50

## 2020-06-17 MED ORDER — ALBUTEROL SULFATE (2.5 MG/3ML) 0.083% IN NEBU
10.0000 mg | INHALATION_SOLUTION | Freq: Once | RESPIRATORY_TRACT | Status: AC
  Administered 2020-06-17: 10 mg via RESPIRATORY_TRACT
  Filled 2020-06-17: qty 12

## 2020-06-17 NOTE — Progress Notes (Signed)
PT Cancellation Note  Patient Details Name: Gregory Moody MRN: [id number] DOB: October 08, 1976   Cancelled Treatment:    Reason Eval/Treat Not Completed: Other (comment) Chart reviewed. Pt's potassium levels at 6.4 this morning which is outside of therapeutic window for treatment. Will hold at this time until values improve and pt safe to participate.   [name] 06/17/2020, 9:19 AM

## 2020-06-17 NOTE — Progress Notes (Signed)
PROGRESS NOTE    Gregory Moody  YBO:[id number] DOB: 09-19-1976 DOA: 06/09/2020 PCP: [place]   Chief Complaint  Patient presents with  . Weakness    Brief Narrative:  HPI per Dr. Jimmye Norman 44 y/o M w/ PMH of DM2, chronic urinary retention s/p suprapubic catheter, possible depression who presents w/ weakness & nausea x 1 week. Pt is a poor historian. Pt c/o difficulty walking & no appetite for 1 week. Pt does use a walker. Pt denies any sick contacts. Pt denies getting COVID19 vaccine. Also, pt c/o diarrhea daily x 2-3 years. Pt has approx 2-3 episodes of diarrhea daily. Pt denies any bright red blood or black stools. Of note, pt is allergic to milk. Pt denies any fevers, chills, sweating, cough, chest pain, shortness of breath, abd pain, or constipation. In the ED patient noted to be hypothermic and hypotensive and placed on IV fluids, patient pancultured.  Patient did receive a dose of IV antibiotics in the ED.   Assessment & Plan:   Principal Problem:   Sepsis secondary to UTI [facility]) Active Problems:   E-coli UTI   UTI due to Klebsiella species   Hyperkalemia   GERD (gastroesophageal reflux disease)   Hypotension   Anemia of chronic disease   Protein-calorie malnutrition, severe   Diarrhea   Urinary retention   Hyperglycemia due to type 2 diabetes mellitus (HCC)   Generalized weakness   Encephalopathy   Hypothermia   Hyponatremia  1 severe sepsis with acute organ dysfunction (hypotension) secondary to E. coli and Klebsiella pneumonia , POA Patient had presented with generalized weakness noted to be hypothermic and hypotensive on admission.  Blood pressure on presentation responded to IV fluids.  Patient pancultured.  Chest x-ray done negative for any acute abnormalities.  Patient with chronic suprapubic catheter.  Urine cultures positive for E. coli and Klebsiella pneumoniae.  Blood pressure improved with hydration.  Procalcitonin < 0.1.  UDS  negative.  Alcohol level < 10.  CT head which was done showed no acute intracranial findings but atrophy present and advanced for age.  Patient with improvement with systolic blood pressure.  TSH within normal limits.  Saline lock IV fluids.  Diflucan has been discontinued.  Patient initially on IV Rocephin subsequently narrowed to IV cefazolin and subsequently transition to oral Keflex to complete a 10 to 14-day course of antibiotic treatment.  Patient underwent suprapubic catheter exchange 06/12/2020.  Urology PA was following but have signed off.  Outpatient follow-up with PCP and urology.  2.  Generalized weakness Questionable etiology.  Likely secondary to urosepsis in the setting of poor oral intake.Patient pancultured.  TSH within normal limits.  Random cortisol was 9.1.  Urine cultures positive for E. coli and Klebsiella pneumonia.  Clinical improvement.  PT/OT.  Home health therapies on discharge.     3.  Metabolic acidosis Bicarb tablets. Follow.   4.  Poorly controlled diabetes mellitus type 2 Hemoglobin A1c 8.3(05/02/2020).  CBG this morning at 224.  Blood glucose levels of 186 on morning labs.  Lantus 5 units daily.  Sliding scale insulin.   5.  Chronic urinary retention status post suprapubic catheter Catheter last changed 06/06/2020 per patient however per urology PA patient missed appointment and suprapubic catheter has not been changed and is due to be changed 06/13/2020, in the outpatient setting.  Urinalysis on admission was turbid, large leukocytes, nitrite negative, greater than 50 WBCs, many bacteria, budding yeast present.  Urine cultures with greater than one hundred thousand  colonies of E. coli and Klebsiella pneumoniae.  Some budding yeast also noted in the urine.  Urine cultures did not grow any yeast.  Patient started on Diflucan empirically initially which was subsequently discontinued as urine cultures were finalized with no growth.  Patient was on IV Rocephin and transitioned to  IV Ancef.  Patient has been transitioned from IV Ancef to oral Keflex.  Continue oral Keflex to complete a 10 to 14-day course of treatment.  Follow.    6.  Hyponatremia Likely secondary to hypovolemic hyponatremia.  Improved with hydration.  TSH within normal limits at 1.462.  Random cortisol level at 9.1.  Sodium currently at 137 this morning.  IV fluids saline lock.  Follow.   7.  Chronic diarrhea Stool cultures ordered and currently pending.  Imodium as needed.  Outpatient follow-up.  8.  Severe protein calorie malnutrition Nutrition consulted.  Continue nutritional supplementation.   9.  chronic kidney disease stage IIIb Renal function close to baseline.  Creatinine currently at 2.03.  Continue bicarb tablets.  Follow.   10.  Gastroesophageal reflux disease Continue PPI.  11.  Depression Stable.  Continue Haldol, Cogentin, Cymbalta.  Outpatient follow-up.    12.  Anemia of chronic disease Hemoglobin currently stable at 8.4.  Likely dilutional effect.  IV fluids have been saline lock.  Continue oral iron supplementation.  Follow.  13.  Hyperkalemia Questionable etiology.  Patient noted to have a potassium of 6.4 this morning from 6.1 from 5.4 from 5.2.  Renal function has improved.  Patient denies any exogenous potassium supplementation.  Patient is not on oral potassium.  Diet has been changed to renal diet.  Patient received a dose of Kayexalate on 06/16/2020 however potassium at 6.4 this morning.  Check a EKG, albuterol nebs, NovoLog 5 units x 1, amp of D50, amp of bicarb, Kayexalate 45 g x 1.  Repeat labs this afternoon and in the morning.     DVT prophylaxis: Lovenox Code Status: Full Family Communication: Updated patient.  No family at bedside. Disposition:   Status is: Inpatient    Dispo: The patient is from: Home              Anticipated d/c is to: Home with home health services               Anticipated d/c date is: Hopefully tomorrow if potassium has normalized.               Patient currently with sepsis secondary to UTI with hypotension which has responded with IV fluids, on antibiotics.  Patient noted to be with worsening hyperkalemia this morning and as such not stable for discharge.        Consultants:   Urology: Dr. Diamantina Providence 06/13/2020  Procedures:   CT head 06/09/2020  CT abdomen and pelvis 06/09/2020  Chest x-ray 06/09/2020  Suprapubic catheter exchange under fluoroscopy per interventional radiology, Dr. Kathlene Cote 06/13/2020  Antimicrobials:  IV Rocephin 06/11/2020>>>> 06/13/2020  Diflucan 06/11/2020>>>>> 06/12/2020   IV cefazolin 06/13/2020>>>> 06/15/2020  Keflex 06/15/2020   Subjective: Patient sitting up in bed currently receiving albuterol nebulizer.  Denies any chest pain or shortness of breath.  Tolerating current diet.   Objective: Vitals:   06/16/20 1537 06/16/20 2327 06/17/20 0728 06/17/20 1030  BP: 124/81 121/87 117/84   Pulse: 86 92 79   Resp: 14 18 18    Temp: 98.4 F (36.9 C) 98.6 F (37 C) 98 F (36.7 C)   TempSrc: Oral  Oral  SpO2: 100% 100% 100% 100%  Weight:      Height:        Intake/Output Summary (Last 24 hours) at 06/17/2020 1049 Last data filed at 06/17/2020 1010 Gross per 24 hour  Intake 984.72 ml  Output 600 ml  Net 384.72 ml   Filed Weights   06/09/20 1251  Weight: 45.4 kg    Examination:  General exam: Chronically ill, frail, emaciated, cachectic.  Respiratory system: CTAB.  No wheezes, no crackles, no rhonchi.  Normal respiratory effort.  Cardiovascular system: Regular rate and rhythm no murmurs rubs or gallops.  No JVD.  No lower extremity edema. Gastrointestinal system: Abdomen is soft, nontender, nondistended, positive bowel sounds.  No rebound.  No guarding.  Gu: Suprapubic catheter in place Central nervous system: Alert and oriented. No focal neurological deficits. Extremities: Symmetric 5 x 5 power. Skin: No rashes, lesions or ulcers Psychiatry: Judgement and insight appear normal. Mood &  affect appropriate.     Data Reviewed: I have personally reviewed following labs and imaging studies  CBC: Recent Labs  Lab 06/12/20 0352 06/12/20 0352 06/13/20 0340 06/14/20 1001 06/15/20 0410 06/16/20 0355 06/17/20 0447  WBC 5.8  --  5.6 5.0  --  6.5 5.3  NEUTROABS 3.2  --  3.0 2.9  --  2.9 2.7  HGB 8.8*   < > 7.9* 8.0* 8.0* 8.1* 8.4*  HCT 24.6*   < > 23.9* 24.6* 23.5* 25.1* 26.0*  MCV 84.8  --  90.9 92.8  --  91.9 94.2  PLT 207  --  215 224  --  223 244   < > = values in this interval not displayed.    Basic Metabolic Panel: Recent Labs  Lab 06/12/20 0352 06/12/20 0352 06/13/20 0340 06/13/20 0340 06/14/20 1001 06/15/20 0410 06/16/20 0355 06/16/20 1405 06/17/20 0447  NA 132*   < > 131*   < > 137 135 133* 132* 137  K 3.9   < > 4.5   < > 4.9 5.2* 5.4* 6.1* 6.4*  CL 100   < > 103   < > 111 112* 108 108 109  CO2 25   < > 25   < > 21* 19* 18* 20* 20*  GLUCOSE 71   < > 170*   < > 173* 155* 164* 186* 198*  BUN 29*   < > 26*   < > 29* 27* 26* 25* 30*  CREATININE 2.16*   < > 2.12*   < > 2.15* 2.11* 2.01* 1.90* 2.03*  CALCIUM 8.4*   < > 8.1*   < > 8.2* 8.4* 8.7* 8.4* 8.6*  MG 1.8  --  2.3  --  2.5*  --   --   --   --   PHOS 2.2*   < > 2.6  --  2.1* 3.6 2.6  --  3.2   < > = values in this interval not displayed.    GFR: Estimated Creatinine Clearance: 29.5 mL/min (A) (by C-G formula based on SCr of 2.03 mg/dL (H)).  Liver Function Tests: Recent Labs  Lab 06/13/20 0340 06/14/20 1001 06/15/20 0410 06/16/20 0355 06/17/20 0447  ALBUMIN 2.5* 2.8* 2.7* 2.7* 2.8*    CBG: Recent Labs  Lab 06/13/20 2043 06/15/20 2045 06/16/20 1147 06/16/20 1639 06/16/20 2130  GLUCAP 162* 494* 119* 223* 204*     Recent Results (from the past 240 hour(s))  Blood Culture (routine x 2)     Status: Abnormal   Collection Time: 06/09/20  1:20 PM   Specimen: BLOOD  Result Value Ref Range Status   Specimen Description   Final    BLOOD RIGHT ANTECUBITAL Performed at [facility], [address] [place], [place] [address]    Special Requests   Final    BOTTLES DRAWN AEROBIC AND ANAEROBIC Blood Culture adequate volume Performed at [facility], [address] [place], [place] [address]    Culture  Setup Time   Final    AEROBIC BOTTLE ONLY GRAM POSITIVE COCCI Organism ID to follow CRITICAL RESULT CALLED TO, READ BACK BY AND VERIFIED WITH: MYRA SLAUGHTER 06/10/20 AT 1450 BY ACR GRAM STAIN REVIEWED-AGREE WITH RESULT    Culture (A)  Final    STAPHYLOCOCCUS CAPITIS THE SIGNIFICANCE OF ISOLATING THIS ORGANISM FROM A SINGLE SET OF BLOOD CULTURES WHEN MULTIPLE SETS ARE DRAWN IS UNCERTAIN. PLEASE NOTIFY THE MICROBIOLOGY DEPARTMENT WITHIN ONE WEEK IF SPECIATION AND SENSITIVITIES ARE REQUIRED. Performed at [facility], [address], [place], [place] [address]    Report Status 06/12/2020 FINAL  Final  Blood Culture (routine x 2)     Status: None   Collection Time: 06/09/20  1:20 PM   Specimen: BLOOD  Result Value Ref Range Status   Specimen Description BLOOD BLOOD RIGHT HAND  Final   Special Requests   Final    BOTTLES DRAWN AEROBIC AND ANAEROBIC Blood Culture adequate volume   Culture   Final    NO GROWTH 5 DAYS Performed at [facility], [address] [place], [place] [address]    Report Status 06/14/2020 FINAL  Final  Urine culture     Status: Abnormal   Collection Time: 06/09/20  1:20 PM   Specimen: In/Out Cath Urine  Result Value Ref Range Status   Specimen Description   Final    IN/OUT CATH URINE Performed at [facility], [address] [place], [place] [address]    Special Requests   Final    NONE Performed at [facility], [address] [place], [place] [address]    Culture (A)  Final    >=100,000 COLONIES/mL ESCHERICHIA COLI >=100,000 COLONIES/mL KLEBSIELLA PNEUMONIAE    Report Status 06/12/2020 FINAL  Final   Organism ID, Bacteria ESCHERICHIA COLI (A)  Final   Organism ID,  Bacteria KLEBSIELLA PNEUMONIAE (A)  Final      Susceptibility   Escherichia coli - MIC*    AMPICILLIN >=32 RESISTANT Resistant     CEFAZOLIN <=4 SENSITIVE Sensitive     CEFTRIAXONE <=0.25 SENSITIVE Sensitive     CIPROFLOXACIN 2 INTERMEDIATE Intermediate     GENTAMICIN <=1 SENSITIVE Sensitive     IMIPENEM <=0.25 SENSITIVE Sensitive     NITROFURANTOIN <=16 SENSITIVE Sensitive     TRIMETH/SULFA <=20 SENSITIVE Sensitive     AMPICILLIN/SULBACTAM 4 SENSITIVE Sensitive     PIP/TAZO <=4 SENSITIVE Sensitive     * >=100,000 COLONIES/mL ESCHERICHIA COLI   Klebsiella pneumoniae - MIC*    AMPICILLIN >=32 RESISTANT Resistant     CEFAZOLIN <=4 SENSITIVE Sensitive     CEFTRIAXONE <=0.25 SENSITIVE Sensitive     CIPROFLOXACIN <=0.25 SENSITIVE Sensitive     GENTAMICIN <=1 SENSITIVE Sensitive     IMIPENEM <=0.25 SENSITIVE Sensitive     NITROFURANTOIN 64 INTERMEDIATE Intermediate     TRIMETH/SULFA <=20 SENSITIVE Sensitive     AMPICILLIN/SULBACTAM 8 SENSITIVE Sensitive     PIP/TAZO <=4 SENSITIVE Sensitive     * >=100,000 COLONIES/mL KLEBSIELLA PNEUMONIAE  SARS Coronavirus 2  by RT PCR (hospital order, performed in [facility] hospital lab) Nasopharyngeal Nasopharyngeal Swab     Status: None   Collection Time: 06/09/20  1:20 PM   Specimen: Nasopharyngeal Swab  Result Value Ref Range Status   SARS Coronavirus 2 NEGATIVE NEGATIVE Final    Comment: (NOTE) SARS-CoV-2 target nucleic acids are NOT DETECTED.  The SARS-CoV-2 RNA is generally detectable in upper and lower respiratory specimens during the acute phase of infection. The lowest concentration of SARS-CoV-2 viral copies this assay can detect is 250 copies / mL. A negative result does not preclude SARS-CoV-2 infection and should not be used as the sole basis for treatment or other patient management decisions.  A negative result may occur with improper specimen collection / handling, submission of specimen other than nasopharyngeal swab, presence  of viral mutation(s) within the areas targeted by this assay, and inadequate number of viral copies (<250 copies / mL). A negative result must be combined with clinical observations, patient history, and epidemiological information.  Fact Sheet for Patients:   StrictlyIdeas.no  Fact Sheet for Healthcare Providers: BankingDealers.co.za  This test is not yet approved or  cleared by the [place] FDA and has been authorized for detection and/or diagnosis of SARS-CoV-2 by FDA under an Emergency Use Authorization (EUA).  This EUA will remain in effect (meaning this test can be used) for the duration of the COVID-19 declaration under Section 564(b)(1) of the Act, 21 U.S.C. section 360bbb-3(b)(1), unless the authorization is terminated or revoked sooner.  Performed at [facility], [address] [place], [place] [address]   Blood Culture ID Panel (Reflexed)     Status: Abnormal   Collection Time: 06/09/20  1:20 PM  Result Value Ref Range Status   Enterococcus faecalis NOT DETECTED NOT DETECTED Final   Enterococcus Faecium NOT DETECTED NOT DETECTED Final   Listeria monocytogenes NOT DETECTED NOT DETECTED Final   Staphylococcus species DETECTED (A) NOT DETECTED Final    Comment: CRITICAL RESULT CALLED TO, READ BACK BY AND VERIFIED WITH: MYRA SLAUGHTER 06/10/20 AT 1450 BY AR    Staphylococcus aureus (BCID) NOT DETECTED NOT DETECTED Final   Staphylococcus epidermidis NOT DETECTED NOT DETECTED Final   Staphylococcus lugdunensis NOT DETECTED NOT DETECTED Final   Streptococcus species NOT DETECTED NOT DETECTED Final   Streptococcus agalactiae NOT DETECTED NOT DETECTED Final   Streptococcus pneumoniae NOT DETECTED NOT DETECTED Final   Streptococcus pyogenes NOT DETECTED NOT DETECTED Final   A.calcoaceticus-baumannii NOT DETECTED NOT DETECTED Final   Bacteroides fragilis NOT DETECTED NOT DETECTED Final   Enterobacterales NOT  DETECTED NOT DETECTED Final   Enterobacter cloacae complex NOT DETECTED NOT DETECTED Final   Escherichia coli NOT DETECTED NOT DETECTED Final   Klebsiella aerogenes NOT DETECTED NOT DETECTED Final   Klebsiella oxytoca NOT DETECTED NOT DETECTED Final   Klebsiella pneumoniae NOT DETECTED NOT DETECTED Final   Proteus species NOT DETECTED NOT DETECTED Final   Salmonella species NOT DETECTED NOT DETECTED Final   Serratia marcescens NOT DETECTED NOT DETECTED Final   Haemophilus influenzae NOT DETECTED NOT DETECTED Final   Neisseria meningitidis NOT DETECTED NOT DETECTED Final   Pseudomonas aeruginosa NOT DETECTED NOT DETECTED Final   Stenotrophomonas maltophilia NOT DETECTED NOT DETECTED Final   Candida albicans NOT DETECTED NOT DETECTED Final   Candida auris NOT DETECTED NOT DETECTED Final   Candida glabrata NOT DETECTED NOT DETECTED Final   Candida krusei NOT DETECTED NOT DETECTED Final   Candida parapsilosis NOT DETECTED NOT DETECTED  Final   Candida tropicalis NOT DETECTED NOT DETECTED Final   Cryptococcus neoformans/gattii NOT DETECTED NOT DETECTED Final    Comment: Performed at [facility], [address] [place], [place] [address]  CULTURE, BLOOD (ROUTINE X 2) w Reflex to ID Panel     Status: None   Collection Time: 06/10/20  5:35 PM   Specimen: BLOOD  Result Value Ref Range Status   Specimen Description BLOOD LEFT ANTECUBITAL  Final   Special Requests   Final    BOTTLES DRAWN AEROBIC AND ANAEROBIC Blood Culture adequate volume   Culture   Final    NO GROWTH 5 DAYS Performed at [facility], [address] [place], [place] [address]    Report Status 06/15/2020 FINAL  Final  CULTURE, BLOOD (ROUTINE X 2) w Reflex to ID Panel     Status: None   Collection Time: 06/10/20  5:41 PM   Specimen: BLOOD  Result Value Ref Range Status   Specimen Description BLOOD BLOOD LEFT HAND  Final   Special Requests   Final    BOTTLES DRAWN AEROBIC ONLY Blood Culture adequate  volume   Culture   Final    NO GROWTH 5 DAYS Performed at [facility], [address] [place],  [address]    Report Status 06/15/2020 FINAL  Final         Radiology Studies: No results found.      Scheduled Meds: . benztropine  0.5 mg Oral BID  . calcium carbonate  1 tablet Oral TID WC  . cephALEXin  500 mg Oral Q8H  . dronabinol  2.5 mg Oral BID AC  . DULoxetine  30 mg Oral Daily  . enoxaparin (LOVENOX) injection  30 mg Subcutaneous Q24H  . feeding supplement (ENSURE ENLIVE)  237 mL Oral BID BM  . feeding supplement (GLUCERNA SHAKE)  237 mL Oral BID BM  . feeding supplement (GLUCERNA SHAKE)  237 mL Oral TID BM  . ferrous sulfate  325 mg Oral BID WC  . haloperidol  0.5 mg Oral BID  . insulin aspart  0-15 Units Subcutaneous TID WC  . insulin aspart  0-5 Units Subcutaneous QHS  . insulin glargine  5 Units Subcutaneous QHS  . multivitamin with minerals  1 tablet Oral Daily  . pantoprazole  40 mg Oral Daily  . Ensure Max Protein  11 oz Oral Daily  . scopolamine  1 patch Transdermal Q72H  . sodium bicarbonate  1,300 mg Oral BID  . sodium chloride flush  3 mL Intravenous Q12H   Continuous Infusions: . sodium chloride Stopped (06/16/20 0957)     LOS: 7 days    Time spent: 40 minutes    [name], MD [facility]   To contact the attending provider between 7A-7P or the covering provider during after hours 7P-7A, please log into the web site [email or web address] and access using universal [place] password for that web site. If you do not have the password, please call the hospital operator.  06/17/2020, 10:49 AM

## 2020-06-17 NOTE — Progress Notes (Signed)
Nutrition Follow-up  DOCUMENTATION CODES:   Underweight, Severe malnutrition in context of chronic illness  INTERVENTION:  D/c Ensure Max  D/c Glucerna   Nepro Shake po BID, each supplement provides 425 kcal and 19 grams protein (vanilla)  No new weights this admit, will order daily weights to assess trends  NUTRITION DIAGNOSIS:   Severe Malnutrition related to chronic illness (IDDM complicated by gastroparesis) as evidenced by severe fat depletion, severe muscle depletion, percent weight loss. -ongoing  GOAL:   Patient will meet greater than or equal to 90% of their needs -meeting with meal intake and oral nutrition supplments  MONITOR:   PO intake, Weight trends, Labs, I & O's, Supplement acceptance  REASON FOR ASSESSMENT:   Consult Poor PO, Assessment of nutrition requirement/status  ASSESSMENT:   44 year old male with history of  IDDM complicated by gastroparesis, chronic urinary retention s/p suprapubic catheter, TB, COVID-19 infection (07/20), multiple admissions secondary to uncontrolled diabetes, urinary retention, and malnutrition presented with weakness, nausea, decreased appetite and difficulty walking over the past week admitted with possible sepsis.  Patient awake and alert this morning. He reports good appetite and intake, per flowsheets eating 100% of meals and drinking supplements. On 9/05, diet changed to renal secondary to elevated potassium, noted 6.4 this morning, will discontinue Ensure Max and Glucerna supplements and switch to Nepro pt agreeable to trying vanilla flavor.   No new weights this admission, will daily weights today to assess trends.  Medications reviewed and include: Tums, Keflex, Marinol, Ensure Enlive, Ensure Max, Glucerna, Haldol, SSI, Lantus, MVI, Protonix, Na bicarbonate  Labs: CBGs 178,224,204,223,119 K 6.4 (H), BUN 30 (H), Hgb 8.4 (L), HCT 26 (L)   Diet Order:   Diet Order            Diet renal with fluid restriction Room  service appropriate? Yes; Fluid consistency: Thin  Diet effective now                 EDUCATION NEEDS:   Education needs have been addressed  Skin:  Skin Assessment: Reviewed RN Assessment  Last BM:  9/6-type 7  Height:   Ht Readings from Last 1 Encounters:  06/09/20 5\' 8"  (1.727 m)    Weight:   Wt Readings from Last 1 Encounters:  06/09/20 45.4 kg    BMI:  Body mass index is 15.2 kg/m.  Estimated Nutritional Needs:   Kcal:  1600-1800  Protein:  80-90  Fluid:  >/= 1.6 L/day    Lajuan Lines, RD, LDN Clinical Nutrition After Hours/Weekend Pager # in [place]

## 2020-06-17 NOTE — TOC Progression Note (Signed)
Transition of Care [facility]) - Progression Note    Patient Details  Name: Gregory Moody MRN: [id number] Date of Birth: [birth year]  [facility] CM/SW Contact  Su Hilt, RN Phone Number: 06/17/2020, 2:33 PM  Clinical Narrative:   [name] with [name] with [facility], they will accept the referral for the charity [facility], The patient was not opened at last visit due to they were unable to reach him.  I explained he needs a Administrator, sports, [name] stated [name]          Expected Discharge Plan and Services                                                 Social Determinants of Health (SDOH) Interventions    Readmission Risk Interventions Readmission Risk Prevention Plan 05/09/2020 04/09/2020 03/31/2020  Transportation Screening Complete Complete Complete  PCP or Specialist Appt within 3-5 Days - - -  Social Work Consult for [place] - - -  Medication Review Press photographer) Complete Complete Complete  PCP or Specialist appointment within 3-5 days of discharge - Patient refused -  [place] or [place] - Not Complete Complete  SW Recovery Care/Counseling Consult - - Complete  Palliative Care Screening - Complete Not [place] Not Applicable Not Applicable Not Applicable  Some recent data might be hidden

## 2020-06-18 LAB — VITAMIN B12: Vitamin B-12: 332 pg/mL (ref 180–914)

## 2020-06-18 LAB — RENAL FUNCTION PANEL
Albumin: 2.7 g/dL — ABNORMAL LOW (ref 3.5–5.0)
Anion gap: 8 (ref 5–15)
BUN: 26 mg/dL — ABNORMAL HIGH (ref 6–20)
CO2: 22 mmol/L (ref 22–32)
Calcium: 8.5 mg/dL — ABNORMAL LOW (ref 8.9–10.3)
Chloride: 107 mmol/L (ref 98–111)
Creatinine, Ser: 1.87 mg/dL — ABNORMAL HIGH (ref 0.61–1.24)
GFR calc Af Amer: 49 mL/min — ABNORMAL LOW (ref >60)
GFR calc non Af Amer: 42 mL/min — ABNORMAL LOW (ref >60)
Glucose, Bld: 135 mg/dL — ABNORMAL HIGH (ref 70–99)
Phosphorus: 4 mg/dL (ref 2.5–4.6)
Potassium: 5.3 mmol/L — ABNORMAL HIGH (ref 3.5–5.1)
Sodium: 137 mmol/L (ref 135–145)

## 2020-06-18 LAB — GLUCOSE, CAPILLARY
Glucose-Capillary: 222 mg/dL — ABNORMAL HIGH (ref 70–99)
Glucose-Capillary: 238 mg/dL — ABNORMAL HIGH (ref 70–99)
Glucose-Capillary: 140 mg/dL — ABNORMAL HIGH (ref 70–99)
Glucose-Capillary: 161 mg/dL — ABNORMAL HIGH (ref 70–99)
Glucose-Capillary: 230 mg/dL — ABNORMAL HIGH (ref 70–99)

## 2020-06-18 LAB — IRON AND TIBC
Iron: 75 ug/dL (ref 45–182)
Saturation Ratios: 30 % (ref 17.9–39.5)
TIBC: 248 ug/dL — ABNORMAL LOW (ref 250–450)
UIBC: 173 ug/dL

## 2020-06-18 MED ORDER — SODIUM ZIRCONIUM CYCLOSILICATE 10 G PO PACK
10.0000 g | PACK | Freq: Once | ORAL | Status: AC
  Administered 2020-06-18: 10 g via ORAL
  Filled 2020-06-18: qty 1

## 2020-06-18 MED ORDER — ENOXAPARIN SODIUM 40 MG/0.4ML ~~LOC~~ SOLN
40.0000 mg | SUBCUTANEOUS | Status: DC
  Administered 2020-06-18: 40 mg via SUBCUTANEOUS
  Filled 2020-06-18: qty 0.4

## 2020-06-18 NOTE — Progress Notes (Signed)
PHARMACIST - PHYSICIAN COMMUNICATION  CONCERNING:  Enoxaparin (Lovenox) for DVT Prophylaxis    RECOMMENDATION: Patient on enoxaparin 30mg  q24 hours for VTE prophylaxis.   Filed Weights   06/09/20 1251  Weight: 45.4 kg (100 lb)    Body mass index is 15.2 kg/m.  Estimated Creatinine Clearance: 32 mL/min (A) (by C-G formula based on SCr of 1.87 mg/dL (H)).  Patient is candidate for enoxaparin 40mg  every 24 hours based on CrCl >68ml/min ( borderline Weight 45.4 kg)  DESCRIPTION: Pharmacy has adjusted enoxaparin dose per [place] [facility] policy.  Patient is now receiving enoxaparin 40 mg every 24 hours    Rayven Hendrickson A 06/18/2020 7:29 AM

## 2020-06-18 NOTE — Progress Notes (Signed)
PT Cancellation Note  Patient Details Name: Gregory Moody MRN: [id number] DOB: 1976/08/05   Cancelled Treatment:    Reason Eval/Treat Not Completed: Medical issues which prohibited therapy   Potassium remains elevated today at 5.3.  Will continue as appropriate.  Will hold today per therapy protocols.   Chesley Noon 06/18/2020, 10:34 AM

## 2020-06-18 NOTE — Progress Notes (Signed)
OT Cancellation Note  Patient Details Name: Gregory Moody MRN: [id number] DOB: 11-21-1975   Cancelled Treatment:    Reason Eval/Treat Not Completed: Medical issues which prohibited therapy. OT continues to follow pt for therapy. Pt noted with elevated K+ of 5.3 this date. Will hold therapy today per protocols and continue to monitor.   Shara Blazing, M.S., OTR/L Ascom: [phone] 06/18/20, 11:10 AM

## 2020-06-18 NOTE — TOC Progression Note (Signed)
Transition of Care [facility]) - Progression Note    Patient Details  Name: Brydon Spahr MRN: [id number] Date of Birth: 14-Jan-1976  Transition of Care [facility]) CM/SW Contact  Su Hilt, RN Phone Number: 06/18/2020, 8:37 AM  Clinical Narrative:   Patient is expected to Discharge home with his mother, he is set up to get [facility] services thru [facility], He received DM last visit a month ago and doe snot need additional, his mother was taught how to do the dressing change for the suprapubic cath last visit         Expected Discharge Plan and Services                                                 Social Determinants of Health (SDOH) Interventions    Readmission Risk Interventions Readmission Risk Prevention Plan 05/09/2020 04/09/2020 03/31/2020  Transportation Screening Complete Complete Complete  PCP or Specialist Appt within 3-5 Days - - -  Social Work Consult for [place] - - -  Medication Review Press photographer) Complete Complete Complete  PCP or Specialist appointment within 3-5 days of discharge - Patient refused -  [place] or [place] - Not Complete Complete  SW Recovery Care/Counseling Consult - - Complete  Palliative Care Screening - Complete Not [place] Not Applicable Not Applicable Not Applicable  Some recent data might be hidden

## 2020-06-18 NOTE — Progress Notes (Signed)
PROGRESS NOTE    Gregory Moody  PIR:[id number] DOB: 10/30/1975 DOA: 06/09/2020 PCP: [facility]   Chief complaint.  Generalized weakness. Brief Narrative:  HPI per Dr. Jimmye Norman 44 y/o M w/ PMH of DM2, chronic urinary retention s/p suprapubic catheter, possible depression who presents w/ weakness & nausea x 1 week. Pt is a poor historian. Pt c/o difficulty walking & no appetite for 1 week. Pt does use a walker. Pt denies any sick contacts. Pt denies getting COVID19 vaccine. Also, pt c/o diarrhea daily x 2-3 years. Pt has approx 2-3 episodes of diarrhea daily. Pt denies any bright red blood or black stools. Of note, pt is allergic to milk. Pt denies any fevers, chills, sweating, cough, chest pain, shortness of breath, abd pain, or constipation. In the ED patient noted to be hypothermic and hypotensive and placed on IV fluids, patient pancultured.  Patient did receive a dose of IV antibiotics in the ED.  9/8.  Patient still has some hyperkalemia,given Lokelma.  Continue oral antibiotics.  Assessment & Plan:   Principal Problem:   Sepsis secondary to UTI [facility]) Active Problems:   GERD (gastroesophageal reflux disease)   Hypotension   Anemia of chronic disease   Protein-calorie malnutrition, severe   Diarrhea   Urinary retention   Hyperglycemia due to type 2 diabetes mellitus (HCC)   Generalized weakness   Hyperkalemia   Encephalopathy   Hypothermia   Hyponatremia   E-coli UTI   UTI due to Klebsiella species  #1.  Severe sepsis secondary to E. coli and Klebsiella UTI. Patient had a positive urine culture with E. coli and Klebsiella pneumoniae.  Blood culture negative.  Procalcitonin level less than 0.1.  Patient had a transient hypotension, improved after IV fluids. Currently he is hemodynamically stable.  2.  Urinary tract infection secondary to E. coli and Klebsiella secondary to suprapubic catheter. I will continue oral Keflex.  3.  Generalized  weakness.  Secondary to sepsis. Continue physical therapy/Occupational Therapy.  4.  Hyperkalemia with chronic kidney disease stage IIIb. Patient currently on bicarb tablets.  We will give another dose of Lokelma.  Recheck a BMP tomorrow.  Patient may need chronic Lokelma dose if potassium still trending high.  5.  Chronic urinary retention status post suprapubic catheter. Suprapubic catheter replaced on 9/3.  6.  Chronic diarrhea. Diarrhea seems to be better,  Imodium as needed.    DVT prophylaxis:  Code Status:  Family Communication:  Disposition Plan:  .   Status is: Inpatient  Remains inpatient appropriate because: Patient potassium level still not stable, need decide if patient need long-term medicine for hyperkalemia.  Most likely discharge home tomorrow.   Dispo: The patient is from: Home              Anticipated d/c is to: Home              Anticipated d/c date is: 1 day              Patient currently is not medically stable to d/c.        I/O last 3 completed shifts: In: 6301 [P.O.:1440; I.V.:3] Out: 6010 [Urine:1650] Total I/O In: 600 [P.O.:600] Out: 500 [Urine:500]     Consultants:   None  Procedures: None  Antimicrobials: Keflex  Subjective: Patient doing better today.  No significant diarrhea.  No nausea vomiting. Denies any short of breath or cough. No fever or chills.  Objective: Vitals:   06/17/20 1939 06/17/20 2309 06/18/20 9323  06/18/20 1555  BP:  97/70 131/89 (!) 131/96  Pulse:  81 75 84  Resp:  16 18 18   Temp:  98.2 F (36.8 C) 97.9 F (36.6 C) 98.5 F (36.9 C)  TempSrc:  Oral Oral Oral  SpO2: 100% 100% 100% 100%  Weight:      Height:        Intake/Output Summary (Last 24 hours) at 06/18/2020 1631 Last data filed at 06/18/2020 1448 Gross per 24 hour  Intake 1080 ml  Output 1550 ml  Net -470 ml   Filed Weights   06/09/20 1251  Weight: 45.4 kg    Examination:  General exam: Appears calm and comfortable  Respiratory  system: Clear to auscultation. Respiratory effort normal. Cardiovascular system: S1 & S2 heard, RRR. No JVD, murmurs, rubs, gallops or clicks. No pedal edema. Gastrointestinal system: Abdomen is nondistended, soft and nontender. No organomegaly or masses felt. Normal bowel sounds heard. Central nervous system: Alert and oriented. No focal neurological deficits. Extremities: Symmetric 5 x 5 power. Skin: No rashes, lesions or ulcers Psychiatry:  Mood & affect appropriate.     Data Reviewed: I have personally reviewed following labs and imaging studies  CBC: Recent Labs  Lab 06/12/20 0352 06/12/20 0352 06/13/20 0340 06/14/20 1001 06/15/20 0410 06/16/20 0355 06/17/20 0447  WBC 5.8  --  5.6 5.0  --  6.5 5.3  NEUTROABS 3.2  --  3.0 2.9  --  2.9 2.7  HGB 8.8*   < > 7.9* 8.0* 8.0* 8.1* 8.4*  HCT 24.6*   < > 23.9* 24.6* 23.5* 25.1* 26.0*  MCV 84.8  --  90.9 92.8  --  91.9 94.2  PLT 207  --  215 224  --  223 244   < > = values in this interval not displayed.   Basic Metabolic Panel: Recent Labs  Lab 06/12/20 0352 06/12/20 0352 06/13/20 0340 06/13/20 0340 06/14/20 1001 06/14/20 1001 06/15/20 0410 06/15/20 0410 06/16/20 0355 06/16/20 1405 06/17/20 0447 06/17/20 1243 06/18/20 0533  NA 132*   < > 131*   < > 137   < > 135   < > 133* 132* 137 138 137  K 3.9   < > 4.5   < > 4.9   < > 5.2*   < > 5.4* 6.1* 6.4* 4.0 5.3*  CL 100   < > 103   < > 111   < > 112*   < > 108 108 109 107 107  CO2 25   < > 25   < > 21*   < > 19*   < > 18* 20* 20* 21* 22  GLUCOSE 71   < > 170*   < > 173*   < > 155*   < > 164* 186* 198* 199* 135*  BUN 29*   < > 26*   < > 29*   < > 27*   < > 26* 25* 30* 27* 26*  CREATININE 2.16*   < > 2.12*   < > 2.15*   < > 2.11*   < > 2.01* 1.90* 2.03* 1.86* 1.87*  CALCIUM 8.4*   < > 8.1*   < > 8.2*   < > 8.4*   < > 8.7* 8.4* 8.6* 8.2* 8.5*  MG 1.8  --  2.3  --  2.5*  --   --   --   --   --   --   --   --   PHOS 2.2*   < >  2.6   < > 2.1*  --  3.6  --  2.6  --  3.2  --  4.0     < > = values in this interval not displayed.   GFR: Estimated Creatinine Clearance: 32 mL/min (A) (by C-G formula based on SCr of 1.87 mg/dL (H)). Liver Function Tests: Recent Labs  Lab 06/14/20 1001 06/15/20 0410 06/16/20 0355 06/17/20 0447 06/18/20 0533  ALBUMIN 2.8* 2.7* 2.7* 2.8* 2.7*   No results for input(s): LIPASE, AMYLASE in the last 168 hours. No results for input(s): AMMONIA in the last 168 hours. Coagulation Profile: No results for input(s): INR, PROTIME in the last 168 hours. Cardiac Enzymes: No results for input(s): CKTOTAL, CKMB, CKMBINDEX, TROPONINI in the last 168 hours. BNP (last 3 results) No results for input(s): PROBNP in the last 8760 hours. HbA1C: No results for input(s): HGBA1C in the last 72 hours. CBG: Recent Labs  Lab 06/17/20 1132 06/17/20 1633 06/17/20 1951 06/18/20 0728 06/18/20 1130  GLUCAP 178* 222* 238* 140* 161*   Lipid Profile: No results for input(s): CHOL, HDL, LDLCALC, TRIG, CHOLHDL, LDLDIRECT in the last 72 hours. Thyroid Function Tests: No results for input(s): TSH, T4TOTAL, FREET4, T3FREE, THYROIDAB in the last 72 hours. Anemia Panel: Recent Labs    06/18/20 0533  VITAMINB12 332  TIBC 248*  IRON 75   Sepsis Labs: No results for input(s): PROCALCITON, LATICACIDVEN in the last 168 hours.  Recent Results (from the past 240 hour(s))  Blood Culture (routine x 2)     Status: Abnormal   Collection Time: 06/09/20  1:20 PM   Specimen: BLOOD  Result Value Ref Range Status   Specimen Description   Final    BLOOD RIGHT ANTECUBITAL Performed at [facility], [address], [place], [place] [address]    Special Requests   Final    BOTTLES DRAWN AEROBIC AND ANAEROBIC Blood Culture adequate volume Performed at [facility], [address] [place], [place] [address]    Culture  Setup Time   Final    AEROBIC BOTTLE ONLY GRAM POSITIVE COCCI Organism ID to follow CRITICAL RESULT CALLED TO, READ BACK BY AND  VERIFIED WITH: MYRA SLAUGHTER 06/10/20 AT 1450 BY ACR GRAM STAIN REVIEWED-AGREE WITH RESULT    Culture (A)  Final    STAPHYLOCOCCUS CAPITIS THE SIGNIFICANCE OF ISOLATING THIS ORGANISM FROM A SINGLE SET OF BLOOD CULTURES WHEN MULTIPLE SETS ARE DRAWN IS UNCERTAIN. PLEASE NOTIFY THE MICROBIOLOGY DEPARTMENT WITHIN ONE WEEK IF SPECIATION AND SENSITIVITIES ARE REQUIRED. Performed at [facility], [place] [address] [place], [place] [address]    Report Status 06/12/2020 FINAL  Final  Blood Culture (routine x 2)     Status: None   Collection Time: 06/09/20  1:20 PM   Specimen: BLOOD  Result Value Ref Range Status   Specimen Description BLOOD BLOOD RIGHT HAND  Final   Special Requests   Final    BOTTLES DRAWN AEROBIC AND ANAEROBIC Blood Culture adequate volume   Culture   Final    NO GROWTH 5 DAYS Performed at [facility], [address] [place], [place] [address]    Report Status 06/14/2020 FINAL  Final  Urine culture     Status: Abnormal   Collection Time: 06/09/20  1:20 PM   Specimen: In/Out Cath Urine  Result Value Ref Range Status   Specimen Description   Final    IN/OUT CATH URINE Performed at [facility], [address] [place], [place]  [address]    Special Requests   Final    NONE Performed at [facility], [address] [place], [place] [address]    Culture (A)  Final    >=100,000 COLONIES/mL ESCHERICHIA COLI >=100,000 COLONIES/mL KLEBSIELLA PNEUMONIAE    Report Status 06/12/2020 FINAL  Final   Organism ID, Bacteria ESCHERICHIA COLI (A)  Final   Organism ID, Bacteria KLEBSIELLA PNEUMONIAE (A)  Final      Susceptibility   Escherichia coli - MIC*    AMPICILLIN >=32 RESISTANT Resistant     CEFAZOLIN <=4 SENSITIVE Sensitive     CEFTRIAXONE <=0.25 SENSITIVE Sensitive     CIPROFLOXACIN 2 INTERMEDIATE Intermediate     GENTAMICIN <=1 SENSITIVE Sensitive     IMIPENEM <=0.25 SENSITIVE Sensitive     NITROFURANTOIN <=16 SENSITIVE Sensitive       TRIMETH/SULFA <=20 SENSITIVE Sensitive     AMPICILLIN/SULBACTAM 4 SENSITIVE Sensitive     PIP/TAZO <=4 SENSITIVE Sensitive     * >=100,000 COLONIES/mL ESCHERICHIA COLI   Klebsiella pneumoniae - MIC*    AMPICILLIN >=32 RESISTANT Resistant     CEFAZOLIN <=4 SENSITIVE Sensitive     CEFTRIAXONE <=0.25 SENSITIVE Sensitive     CIPROFLOXACIN <=0.25 SENSITIVE Sensitive     GENTAMICIN <=1 SENSITIVE Sensitive     IMIPENEM <=0.25 SENSITIVE Sensitive     NITROFURANTOIN 64 INTERMEDIATE Intermediate     TRIMETH/SULFA <=20 SENSITIVE Sensitive     AMPICILLIN/SULBACTAM 8 SENSITIVE Sensitive     PIP/TAZO <=4 SENSITIVE Sensitive     * >=100,000 COLONIES/mL KLEBSIELLA PNEUMONIAE  SARS Coronavirus 2 by RT PCR (hospital order, performed in [place] hospital lab) Nasopharyngeal Nasopharyngeal Swab     Status: None   Collection Time: 06/09/20  1:20 PM   Specimen: Nasopharyngeal Swab  Result Value Ref Range Status   SARS Coronavirus 2 NEGATIVE NEGATIVE Final    Comment: (NOTE) SARS-CoV-2 target nucleic acids are NOT DETECTED.  The SARS-CoV-2 RNA is generally detectable in upper and lower respiratory specimens during the acute phase of infection. The lowest concentration of SARS-CoV-2 viral copies this assay can detect is 250 copies / mL. A negative result does not preclude SARS-CoV-2 infection and should not be used as the sole basis for treatment or other patient management decisions.  A negative result may occur with improper specimen collection / handling, submission of specimen other than nasopharyngeal swab, presence of viral mutation(s) within the areas targeted by this assay, and inadequate number of viral copies (<250 copies / mL). A negative result must be combined with clinical observations, patient history, and epidemiological information.  Fact Sheet for Patients:   StrictlyIdeas.no  Fact Sheet for Healthcare  Providers: BankingDealers.co.za  This test is not yet approved or  cleared by the [place] FDA and has been authorized for detection and/or diagnosis of SARS-CoV-2 by FDA under an Emergency Use Authorization (EUA).  This EUA will remain in effect (meaning this test can be used) for the duration of the COVID-19 declaration under Section 564(b)(1) of the Act, 21 U.S.C. section 360bbb-3(b)(1), unless the authorization is terminated or revoked sooner.  Performed at [facility], [address] [place], [place] [address]   Blood Culture ID Panel (Reflexed)     Status: Abnormal   Collection Time: 06/09/20  1:20 PM  Result Value Ref Range Status   Enterococcus faecalis NOT DETECTED NOT DETECTED Final   Enterococcus Faecium NOT DETECTED NOT DETECTED Final   Listeria monocytogenes NOT DETECTED NOT DETECTED Final  Staphylococcus species DETECTED (A) NOT DETECTED Final    Comment: CRITICAL RESULT CALLED TO, READ BACK BY AND VERIFIED WITH: MYRA SLAUGHTER 06/10/20 AT 1450 BY AR    Staphylococcus aureus (BCID) NOT DETECTED NOT DETECTED Final   Staphylococcus epidermidis NOT DETECTED NOT DETECTED Final   Staphylococcus lugdunensis NOT DETECTED NOT DETECTED Final   Streptococcus species NOT DETECTED NOT DETECTED Final   Streptococcus agalactiae NOT DETECTED NOT DETECTED Final   Streptococcus pneumoniae NOT DETECTED NOT DETECTED Final   Streptococcus pyogenes NOT DETECTED NOT DETECTED Final   A.calcoaceticus-baumannii NOT DETECTED NOT DETECTED Final   Bacteroides fragilis NOT DETECTED NOT DETECTED Final   Enterobacterales NOT DETECTED NOT DETECTED Final   Enterobacter cloacae complex NOT DETECTED NOT DETECTED Final   Escherichia coli NOT DETECTED NOT DETECTED Final   Klebsiella aerogenes NOT DETECTED NOT DETECTED Final   Klebsiella oxytoca NOT DETECTED NOT DETECTED Final   Klebsiella pneumoniae NOT DETECTED NOT DETECTED Final   Proteus species NOT DETECTED  NOT DETECTED Final   Salmonella species NOT DETECTED NOT DETECTED Final   Serratia marcescens NOT DETECTED NOT DETECTED Final   Haemophilus influenzae NOT DETECTED NOT DETECTED Final   Neisseria meningitidis NOT DETECTED NOT DETECTED Final   Pseudomonas aeruginosa NOT DETECTED NOT DETECTED Final   Stenotrophomonas maltophilia NOT DETECTED NOT DETECTED Final   Candida albicans NOT DETECTED NOT DETECTED Final   Candida auris NOT DETECTED NOT DETECTED Final   Candida glabrata NOT DETECTED NOT DETECTED Final   Candida krusei NOT DETECTED NOT DETECTED Final   Candida parapsilosis NOT DETECTED NOT DETECTED Final   Candida tropicalis NOT DETECTED NOT DETECTED Final   Cryptococcus neoformans/gattii NOT DETECTED NOT DETECTED Final    Comment: Performed at [facility], [address] [place], [place] [address]  CULTURE, BLOOD (ROUTINE X 2) w Reflex to ID Panel     Status: None   Collection Time: 06/10/20  5:35 PM   Specimen: BLOOD  Result Value Ref Range Status   Specimen Description BLOOD LEFT ANTECUBITAL  Final   Special Requests   Final    BOTTLES DRAWN AEROBIC AND ANAEROBIC Blood Culture adequate volume   Culture   Final    NO GROWTH 5 DAYS Performed at [facility], [address] [place], [place] [address]    Report Status 06/15/2020 FINAL  Final  CULTURE, BLOOD (ROUTINE X 2) w Reflex to ID Panel     Status: None   Collection Time: 06/10/20  5:41 PM   Specimen: BLOOD  Result Value Ref Range Status   Specimen Description BLOOD BLOOD LEFT HAND  Final   Special Requests   Final    BOTTLES DRAWN AEROBIC ONLY Blood Culture adequate volume   Culture   Final    NO GROWTH 5 DAYS Performed at [facility], [address] [place], [place] [address]    Report Status 06/15/2020 FINAL  Final         Radiology Studies: No results found.      Scheduled Meds: . benztropine  0.5 mg Oral BID  . calcium carbonate  1 tablet Oral TID WC  . cephALEXin   500 mg Oral Q8H  . dronabinol  2.5 mg Oral BID AC  . DULoxetine  30 mg Oral Daily  . enoxaparin (LOVENOX) injection  40 mg Subcutaneous Q24H  . feeding supplement (NEPRO CARB STEADY)  237 mL Oral BID BM  . ferrous sulfate  325 mg Oral BID WC  . haloperidol  0.5 mg Oral BID  . insulin aspart  0-15 Units Subcutaneous TID WC  . insulin aspart  0-5 Units Subcutaneous QHS  . insulin glargine  5 Units Subcutaneous QHS  . multivitamin with minerals  1 tablet Oral Daily  . pantoprazole  40 mg Oral Daily  . scopolamine  1 patch Transdermal Q72H  . sodium bicarbonate  1,300 mg Oral BID  . sodium chloride flush  3 mL Intravenous Q12H   Continuous Infusions: . sodium chloride Stopped (06/16/20 0957)     LOS: 8 days    Time spent: 56 m[place]utes    Sharen Hones, MD [facility]   To contact the attending provider between 7A-7P or the covering provider during after hours 7P-7A, please log into the web site [email or web address] and access using universal [place] password for that web site. If you do not have the password, please call the hospital operator.  06/18/2020, 4:31 PM

## 2020-06-18 NOTE — Plan of Care (Signed)
No acute events this shift. Problem: Education: Goal: Knowledge of General Education information will improve Description: Including pain rating scale, medication(s)/side effects and non-pharmacologic comfort measures Outcome: Progressing   Problem: Activity: Goal: Risk for activity intolerance will decrease Outcome: Progressing   Problem: Pain Managment: Goal: General experience of comfort will improve Outcome: Progressing   Problem: Safety: Goal: Ability to remain free from injury will improve Outcome: Progressing

## 2020-06-19 DIAGNOSIS — G9340 Encephalopathy, unspecified: Secondary | ICD-10-CM

## 2020-06-19 LAB — CBC WITH DIFFERENTIAL/PLATELET
Abs Immature Granulocytes: 0.01 10*3/uL (ref 0.00–0.07)
Basophils Absolute: 0 10*3/uL (ref 0.0–0.1)
Basophils Relative: 0 %
Eosinophils Absolute: 0.3 10*3/uL (ref 0.0–0.5)
Eosinophils Relative: 6 %
HCT: 23.4 % — ABNORMAL LOW (ref 39.0–52.0)
Hemoglobin: 8 g/dL — ABNORMAL LOW (ref 13.0–17.0)
Immature Granulocytes: 0 %
Lymphocytes Relative: 48 %
Lymphs Abs: 2.4 10*3/uL (ref 0.7–4.0)
MCH: 30.5 pg (ref 26.0–34.0)
MCHC: 34.2 g/dL (ref 30.0–36.0)
MCV: 89.3 fL (ref 80.0–100.0)
Monocytes Absolute: 0.3 10*3/uL (ref 0.1–1.0)
Monocytes Relative: 6 %
Neutro Abs: 2 10*3/uL (ref 1.7–7.7)
Neutrophils Relative %: 40 %
Platelets: 247 10*3/uL (ref 150–400)
RBC: 2.62 MIL/uL — ABNORMAL LOW (ref 4.22–5.81)
RDW: 15.9 % — ABNORMAL HIGH (ref 11.5–15.5)
WBC: 5.2 10*3/uL (ref 4.0–10.5)
nRBC: 0 % (ref 0.0–0.2)

## 2020-06-19 LAB — BASIC METABOLIC PANEL
Anion gap: 6 (ref 5–15)
BUN: 22 mg/dL — ABNORMAL HIGH (ref 6–20)
CO2: 23 mmol/L (ref 22–32)
Calcium: 8.4 mg/dL — ABNORMAL LOW (ref 8.9–10.3)
Chloride: 106 mmol/L (ref 98–111)
Creatinine, Ser: 1.9 mg/dL — ABNORMAL HIGH (ref 0.61–1.24)
GFR calc Af Amer: 48 mL/min — ABNORMAL LOW (ref >60)
GFR calc non Af Amer: 42 mL/min — ABNORMAL LOW (ref >60)
Glucose, Bld: 141 mg/dL — ABNORMAL HIGH (ref 70–99)
Potassium: 5 mmol/L (ref 3.5–5.1)
Sodium: 135 mmol/L (ref 135–145)

## 2020-06-19 LAB — GLUCOSE, CAPILLARY
Glucose-Capillary: 268 mg/dL — ABNORMAL HIGH (ref 70–99)
Glucose-Capillary: 134 mg/dL — ABNORMAL HIGH (ref 70–99)
Glucose-Capillary: 119 mg/dL — ABNORMAL HIGH (ref 70–99)

## 2020-06-19 MED ORDER — SODIUM POLYSTYRENE SULFONATE 15 GM/60ML PO SUSP: 15.0000 g | ORAL | 0 refills | Status: DC

## 2020-06-19 NOTE — TOC Progression Note (Signed)
Transition of Care [facility]) - Progression Note    Patient Details  Name: Jacksen Isip MRN: [id number] Date of Birth: 1976/04/19  Transition of Care [facility]) CM/SW [place], RN Phone Number: 06/19/2020, 3:14 PM  Clinical Narrative:   Spoke with [facility] transportation to arrange transport home, he can walk with a walker, they are faxing a waiver to complete, I received and provided to sign, they are setting up transport and will call nurses desk once arrived to pick up         Expected Discharge Plan and Services           Expected Discharge Date: 06/19/20                                     Social Determinants of Health (SDOH) Interventions    Readmission Risk Interventions Readmission Risk Prevention Plan 05/09/2020 04/09/2020 03/31/2020  Transportation Screening Complete Complete Complete  PCP or Specialist Appt within 3-5 Days - - -  Social Work Consult for [place] - - -  Medication Review Press photographer) Complete Complete Complete  PCP or Specialist appointment within 3-5 days of discharge - Patient refused -  [place] or [place] - Not Complete Complete  SW Recovery Care/Counseling Consult - - Complete  Palliative Care Screening - Complete Not [place] Not Applicable Not Applicable Not Applicable  Some recent data might be hidden

## 2020-06-19 NOTE — Discharge Summary (Addendum)
Physician Discharge Summary  Patient ID: Gregory Moody MRN: [id number] DOB/AGE: Apr 27, 1976 44 y.o.  Admit date: 06/09/2020 Discharge date: 06/19/2020  Admission Diagnoses:  Discharge Diagnoses:  Principal Problem:   Sepsis secondary to UTI [facility]) Active Problems:   GERD (gastroesophageal reflux disease)   Hypotension   Anemia of chronic disease   Protein-calorie malnutrition, severe   Diarrhea   Urinary retention   Hyperglycemia due to type 2 diabetes mellitus (HCC)   Generalized weakness   Hyperkalemia   Encephalopathy   Hypothermia   Hyponatremia   E-coli UTI   UTI due to Klebsiella species   Discharged Condition: good  Hospital Course:  44 y/o M w/ PMH of DM2, chronic urinary retention s/p suprapubic catheter, possible depression who presents w/ weakness & nausea x 1 week. Pt is a poor historian. Pt c/o difficulty walking & no appetite for 1 week. Pt does use a walker. Pt denies any sick contacts. Pt denies getting COVID19 vaccine. Also, pt c/o diarrhea daily x 2-3 years. Pt has approx 2-3 episodes of diarrhea daily. Pt denies any bright red blood or black stools. Of note, pt is allergic to milk. Pt denies any fevers, chills, sweating, cough, chest pain, shortness of breath, abd pain, or constipation. In the ED patient noted to be hypothermic and hypotensive and placed on IV fluids, patient pancultured. Patient did receive a dose of IV antibiotics in the ED.  9/8.  Patient still has some hyperkalemia,given Lokelma.  Continue oral antibiotics.  9/9.  Patient potassium is better.  He will need potassium binder at discharge.  Discussed with Education officer, museum, med management pharmacy does not carry Lokelma.  Only available option is Kayexalate which can be filled by commercial pharmacy with a small cost.  Prescription is printed out, social worker will try to help. Patient also was not compliant with follow-ups.  Social worker try to set up with a PCP follow-up in 1 week, also  schedule him to see nephrology in 1 week time.  #1.  Severe sepsis secondary to E. coli and Klebsiella UTI. Patient had a positive urine culture with E. coli and Klebsiella pneumoniae.  Blood culture negative.  Procalcitonin level less than 0.1.  Patient had a transient hypotension, improved after IV fluids. Currently he is hemodynamically stable.  2.  Urinary tract infection secondary to E. coli and Klebsiella secondary to suprapubic catheter. Completed 9 days of antibiotics, no additional antibiotics needed.  3.  Generalized weakness.  Secondary to sepsis.  4.  Hyperkalemia with chronic kidney disease stage IIIb. Lokelma is not an option at discharge.  Only option now is Kayexalate, even he has some diarrhea.  Due to his hyperkalemia, he will be given Kayexalate every 48 hours.  Patient be followed by nephrology in 1 week time.  5.  Chronic urinary retention status post suprapubic catheter. Suprapubic catheter replaced on 9/3.  6.  Chronic diarrhea. Diarrhea seems to be better,  Imodium as needed.  Consults: urology  Significant Diagnostic Studies:   Treatments: Antibiotics and IV fluids.  Discharge Exam: Blood pressure (!) 139/96, pulse 73, temperature 98.3 F (36.8 C), temperature source Oral, resp. rate 16, height 5\' 8"  (1.727 m), weight 45.4 kg, SpO2 100 %. General appearance: alert and cooperative Resp: clear to auscultation bilaterally Cardio: regular rate and rhythm, S1, S2 normal, no murmur, click, rub or gallop GI: soft, non-tender; bowel sounds normal; no masses,  no organomegaly Extremities: extremities normal, atraumatic, no cyanosis or edema  Disposition: Discharge disposition: 01-Home or Self  Care       Discharge Instructions    Diet - low sodium heart healthy   Complete by: As directed    Increase activity slowly   Complete by: As directed      Allergies as of 06/19/2020   No Known Allergies     Medication List    STOP taking these medications    docusate sodium 100 MG capsule Commonly known as: COLACE     TAKE these medications   aspirin EC 81 MG tablet Take 81 mg by mouth daily.   benztropine 0.5 MG tablet Commonly known as: COGENTIN Take 1 tablet (0.5 mg total) by mouth 2 (two) times daily.   colesevelam 625 MG tablet Commonly known as: WELCHOL Take 3 tablets (1,875 mg total) by mouth 2 (two) times daily with a meal.   DULoxetine 30 MG capsule Commonly known as: CYMBALTA Take 1 capsule (30 mg total) by mouth daily.   feeding supplement (GLUCERNA SHAKE) Liqd Take 237 mLs by mouth 3 (three) times daily between meals.   ferrous sulfate 325 (65 FE) MG EC tablet Take 1 tablet (325 mg total) by mouth 2 (two) times daily.   haloperidol 0.5 MG tablet Commonly known as: HALDOL Take 1 tablet (0.5 mg total) by mouth 2 (two) times daily.   insulin glargine 100 UNIT/ML injection Commonly known as: LANTUS Inject 0.08 mLs (8 Units total) into the skin daily.   loperamide 2 MG capsule Commonly known as: IMODIUM Take 1 capsule (2 mg total) by mouth 2 (two) times daily as needed for diarrhea or loose stools.   multivitamin with minerals Tabs tablet Take 1 tablet by mouth daily.   NexIUM 40 MG capsule Generic drug: esomeprazole Take 40 mg by mouth daily.   pantoprazole 40 MG tablet Commonly known as: PROTONIX Take 1 tablet (40 mg total) by mouth daily.   sodium bicarbonate 650 MG tablet Take 2 tablets (1,300 mg total) by mouth 2 (two) times daily.   sodium polystyrene 15 GM/60ML suspension Commonly known as: KAYEXALATE Take 60 mLs (15 g total) by mouth every other day.       Follow-up [place] Follow up in 1 week(s).   Contact information: [place] [address] [phone]        Murlean Iba, MD Follow up in 1 week(s).   Specialty: Nephrology Contact information: [place] [address] [phone]              45 minutes  Signed: Sharen Hones 06/19/2020, 10:48 AM

## 2020-06-19 NOTE — Progress Notes (Signed)
Pt ready for discharge home today per MD. Patient assessment unchanged from this morning. MD aware of BP 91/62 at d/c-Pt asymptomatic. Reviewed discharge instructions and prescriptions with pt with interpreter present ; all questions answered and pt verbalized understanding. PIV removed. Pt assisted to car via Agricultural consultant. CM set up [place] for transport home.

## 2020-06-19 NOTE — TOC Progression Note (Signed)
Transition of Care [facility]) - Progression Note    Patient Details  Name: Wahid Holley MRN: [id number] Date of Birth: 04/19/1976  Transition of Care [facility]) CM/SW [place], RN Phone Number: 06/19/2020, 10:58 AM  Clinical Narrative:   Met with the patient, I explained that he will be discharged home today, He is going home with his mother, I provided him with the printed RX for Kayexalate and a Good RX card to help with the cost, he has been provided with a [facility] last admission and a RW and 3 in 1, he was set up previously and again for [facility] services thru [facility] and they were not ever able to reach him, he stated that he has no additional needs         Expected Discharge Plan and Services           Expected Discharge Date: 06/19/20                                     Social Determinants of Health (SDOH) Interventions    Readmission Risk Interventions Readmission Risk Prevention Plan 05/09/2020 04/09/2020 03/31/2020  Transportation Screening Complete Complete Complete  PCP or Specialist Appt within 3-5 Days - - -  Social Work Consult for [place] - - -  Medication Review Press photographer) Complete Complete Complete  PCP or Specialist appointment within 3-5 days of discharge - Patient refused -  [place] or [place] - Not Complete Complete  SW Recovery Care/Counseling Consult - - Complete  Palliative Care Screening - Complete Not [place] Not Applicable Not Applicable Not Applicable  Some recent data might be hidden

## 2020-07-15 ENCOUNTER — Other Ambulatory Visit: Payer: Self-pay

## 2020-07-15 ENCOUNTER — Ambulatory Visit (INDEPENDENT_AMBULATORY_CARE_PROVIDER_SITE_OTHER): Payer: Self-pay | Admitting: Physician Assistant

## 2020-07-15 DIAGNOSIS — N319 Neuromuscular dysfunction of bladder, unspecified: Secondary | ICD-10-CM

## 2020-07-15 NOTE — Progress Notes (Signed)
Suprapubic Cath Change  Patient is present today for a suprapubic catheter change due to urinary retention.  16ml of water was drained from the balloon, a 16FR foley cath was removed from the tract without difficulty.  Site was cleaned and prepped in a sterile fashion with betadine.  A 16FR foley cath was replaced into the tract no complications were noted. Urine return was not noted and a leg bag was attached for drainage.  Patient tolerated well. Proper instruction was given on how to switch bags.    Performed by: Debroah Loop, PA-C   Follow up: Return in about 4 weeks (around 08/12/2020) for SPT exchange.

## 2020-07-26 ENCOUNTER — Inpatient Hospital Stay
Admission: EM | Admit: 2020-07-26 | Discharge: 2020-07-31 | DRG: 640 | Disposition: A | Discharge status: Home / Self Care | Payer: MEDICAID | Attending: Internal Medicine | Admitting: Internal Medicine

## 2020-07-26 ENCOUNTER — Encounter: Payer: Self-pay | Admitting: Emergency Medicine

## 2020-07-26 ENCOUNTER — Emergency Department: Payer: MEDICAID

## 2020-07-26 ENCOUNTER — Other Ambulatory Visit: Payer: Self-pay

## 2020-07-26 DIAGNOSIS — N179 Acute kidney failure, unspecified: Secondary | ICD-10-CM | POA: Diagnosis present

## 2020-07-26 DIAGNOSIS — R531 Weakness: Secondary | ICD-10-CM

## 2020-07-26 DIAGNOSIS — Z7982 Long term (current) use of aspirin: Secondary | ICD-10-CM

## 2020-07-26 DIAGNOSIS — Z9119 Patient's noncompliance with other medical treatment and regimen: Secondary | ICD-10-CM

## 2020-07-26 DIAGNOSIS — I959 Hypotension, unspecified: Secondary | ICD-10-CM | POA: Diagnosis present

## 2020-07-26 DIAGNOSIS — Z936 Other artificial openings of urinary tract status: Secondary | ICD-10-CM

## 2020-07-26 DIAGNOSIS — E1143 Type 2 diabetes mellitus with diabetic autonomic (poly)neuropathy: Secondary | ICD-10-CM | POA: Diagnosis present

## 2020-07-26 DIAGNOSIS — Z79899 Other long term (current) drug therapy: Secondary | ICD-10-CM

## 2020-07-26 DIAGNOSIS — E1122 Type 2 diabetes mellitus with diabetic chronic kidney disease: Secondary | ICD-10-CM | POA: Diagnosis present

## 2020-07-26 DIAGNOSIS — E860 Dehydration: Principal | ICD-10-CM | POA: Diagnosis present

## 2020-07-26 DIAGNOSIS — Z20822 Contact with and (suspected) exposure to covid-19: Secondary | ICD-10-CM | POA: Diagnosis present

## 2020-07-26 DIAGNOSIS — E43 Unspecified severe protein-calorie malnutrition: Secondary | ICD-10-CM | POA: Diagnosis present

## 2020-07-26 DIAGNOSIS — R296 Repeated falls: Secondary | ICD-10-CM | POA: Diagnosis present

## 2020-07-26 DIAGNOSIS — Z833 Family history of diabetes mellitus: Secondary | ICD-10-CM

## 2020-07-26 DIAGNOSIS — W19XXXA Unspecified fall, initial encounter: Secondary | ICD-10-CM | POA: Diagnosis present

## 2020-07-26 DIAGNOSIS — K3184 Gastroparesis: Secondary | ICD-10-CM | POA: Diagnosis present

## 2020-07-26 DIAGNOSIS — Z8616 Personal history of COVID-19: Secondary | ICD-10-CM

## 2020-07-26 DIAGNOSIS — K219 Gastro-esophageal reflux disease without esophagitis: Secondary | ICD-10-CM | POA: Diagnosis present

## 2020-07-26 DIAGNOSIS — N319 Neuromuscular dysfunction of bladder, unspecified: Secondary | ICD-10-CM | POA: Diagnosis present

## 2020-07-26 DIAGNOSIS — Z794 Long term (current) use of insulin: Secondary | ICD-10-CM

## 2020-07-26 DIAGNOSIS — K529 Noninfective gastroenteritis and colitis, unspecified: Secondary | ICD-10-CM | POA: Diagnosis present

## 2020-07-26 DIAGNOSIS — E871 Hypo-osmolality and hyponatremia: Secondary | ICD-10-CM | POA: Diagnosis present

## 2020-07-26 DIAGNOSIS — Z681 Body mass index (BMI) 19 or less, adult: Secondary | ICD-10-CM

## 2020-07-26 DIAGNOSIS — Z720 Tobacco use: Secondary | ICD-10-CM | POA: Diagnosis present

## 2020-07-26 DIAGNOSIS — E1165 Type 2 diabetes mellitus with hyperglycemia: Secondary | ICD-10-CM | POA: Diagnosis present

## 2020-07-26 DIAGNOSIS — N184 Chronic kidney disease, stage 4 (severe): Secondary | ICD-10-CM

## 2020-07-26 DIAGNOSIS — R627 Adult failure to thrive: Secondary | ICD-10-CM | POA: Diagnosis present

## 2020-07-26 DIAGNOSIS — Z23 Encounter for immunization: Secondary | ICD-10-CM

## 2020-07-26 DIAGNOSIS — Z87891 Personal history of nicotine dependence: Secondary | ICD-10-CM

## 2020-07-26 LAB — CBC WITH DIFFERENTIAL/PLATELET
Abs Immature Granulocytes: 0.01 10*3/uL (ref 0.00–0.07)
Basophils Absolute: 0 10*3/uL (ref 0.0–0.1)
Basophils Relative: 1 %
Eosinophils Absolute: 0.1 10*3/uL (ref 0.0–0.5)
Eosinophils Relative: 1 %
HCT: 24.2 % — ABNORMAL LOW (ref 39.0–52.0)
Hemoglobin: 8.3 g/dL — ABNORMAL LOW (ref 13.0–17.0)
Immature Granulocytes: 0 %
Lymphocytes Relative: 33 %
Lymphs Abs: 1.6 10*3/uL (ref 0.7–4.0)
MCH: 31.1 pg (ref 26.0–34.0)
MCHC: 34.3 g/dL (ref 30.0–36.0)
MCV: 90.6 fL (ref 80.0–100.0)
Monocytes Absolute: 0.3 10*3/uL (ref 0.1–1.0)
Monocytes Relative: 6 %
Neutro Abs: 3 10*3/uL (ref 1.7–7.7)
Neutrophils Relative %: 59 %
Platelets: 272 10*3/uL (ref 150–400)
RBC: 2.67 MIL/uL — ABNORMAL LOW (ref 4.22–5.81)
RDW: 15.3 % (ref 11.5–15.5)
WBC: 5 10*3/uL (ref 4.0–10.5)
nRBC: 0 % (ref 0.0–0.2)

## 2020-07-26 LAB — TROPONIN I (HIGH SENSITIVITY)
Troponin I (High Sensitivity): 4 ng/L (ref <18)
Troponin I (High Sensitivity): 3 ng/L (ref <18)

## 2020-07-26 LAB — COMPREHENSIVE METABOLIC PANEL
ALT: 33 U/L (ref 0–44)
AST: 17 U/L (ref 15–41)
Albumin: 3.1 g/dL — ABNORMAL LOW (ref 3.5–5.0)
Alkaline Phosphatase: 73 U/L (ref 38–126)
Anion gap: 7 (ref 5–15)
BUN: 28 mg/dL — ABNORMAL HIGH (ref 6–20)
CO2: 18 mmol/L — ABNORMAL LOW (ref 22–32)
Calcium: 8.6 mg/dL — ABNORMAL LOW (ref 8.9–10.3)
Chloride: 103 mmol/L (ref 98–111)
Creatinine, Ser: 2.68 mg/dL — ABNORMAL HIGH (ref 0.61–1.24)
GFR, Estimated: 27 mL/min — ABNORMAL LOW (ref >60)
Glucose, Bld: 328 mg/dL — ABNORMAL HIGH (ref 70–99)
Potassium: 3.8 mmol/L (ref 3.5–5.1)
Sodium: 128 mmol/L — ABNORMAL LOW (ref 135–145)
Total Bilirubin: 0.5 mg/dL (ref 0.3–1.2)
Total Protein: 6.3 g/dL — ABNORMAL LOW (ref 6.5–8.1)

## 2020-07-26 LAB — RESPIRATORY PANEL BY RT PCR (FLU A&B, COVID)
Influenza A by PCR: NEGATIVE
Influenza B by PCR: NEGATIVE
SARS Coronavirus 2 by RT PCR: NEGATIVE

## 2020-07-26 LAB — MAGNESIUM: Magnesium: 2.1 mg/dL (ref 1.7–2.4)

## 2020-07-26 LAB — LACTIC ACID, PLASMA: Lactic Acid, Venous: 1.6 mmol/L (ref 0.5–1.9)

## 2020-07-26 IMAGING — CR DG LUMBAR SPINE 2-3V
1 series · 3 of 3 positions shown · non-contrast
Comparison: None.

CLINICAL DATA: Patient fell yesterday. Presents with low back pain.

EXAM:
LUMBAR SPINE - 2-3 VIEW

[Series 1: dg lumbar spine 2-3 views · 0.14mm/px · 3 of 3 slices shown]
[im 1/3]
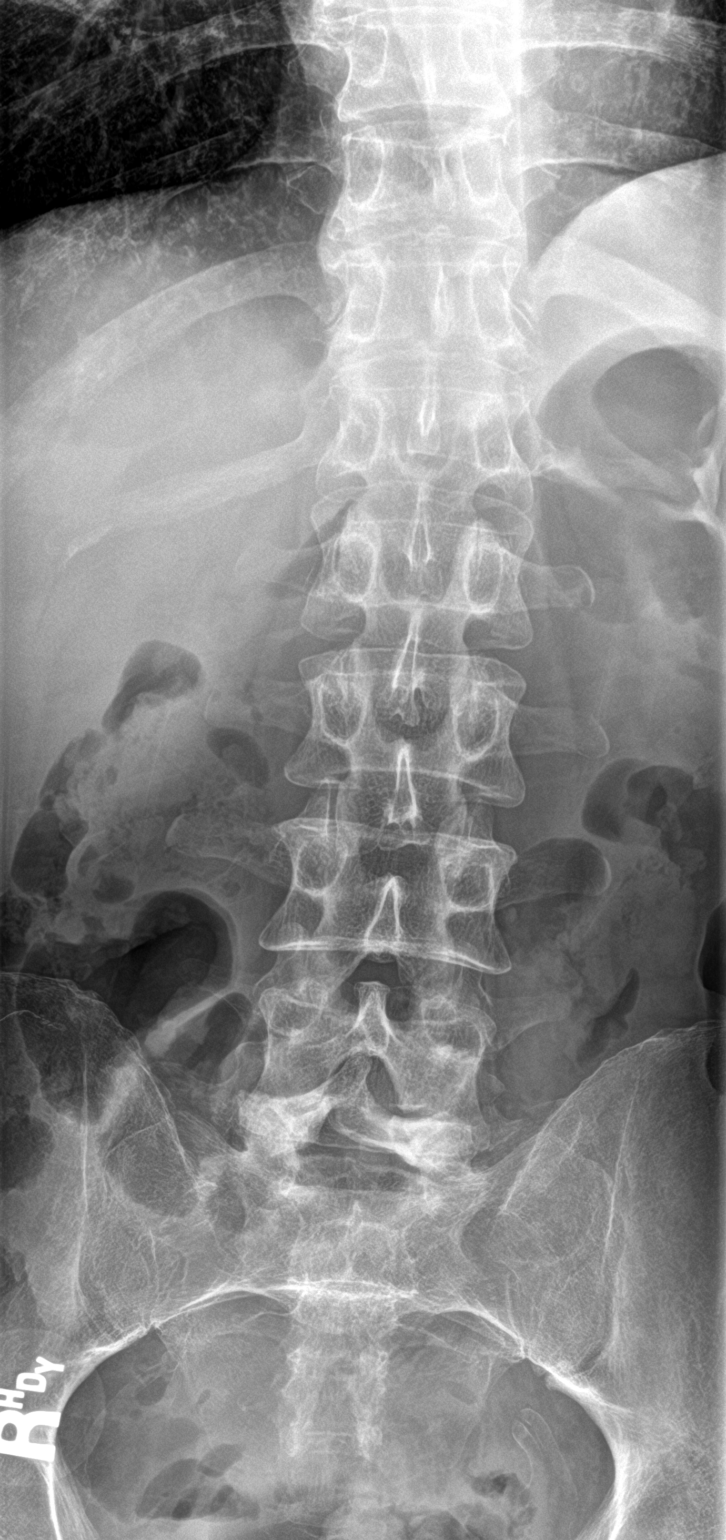
[im 2/3]
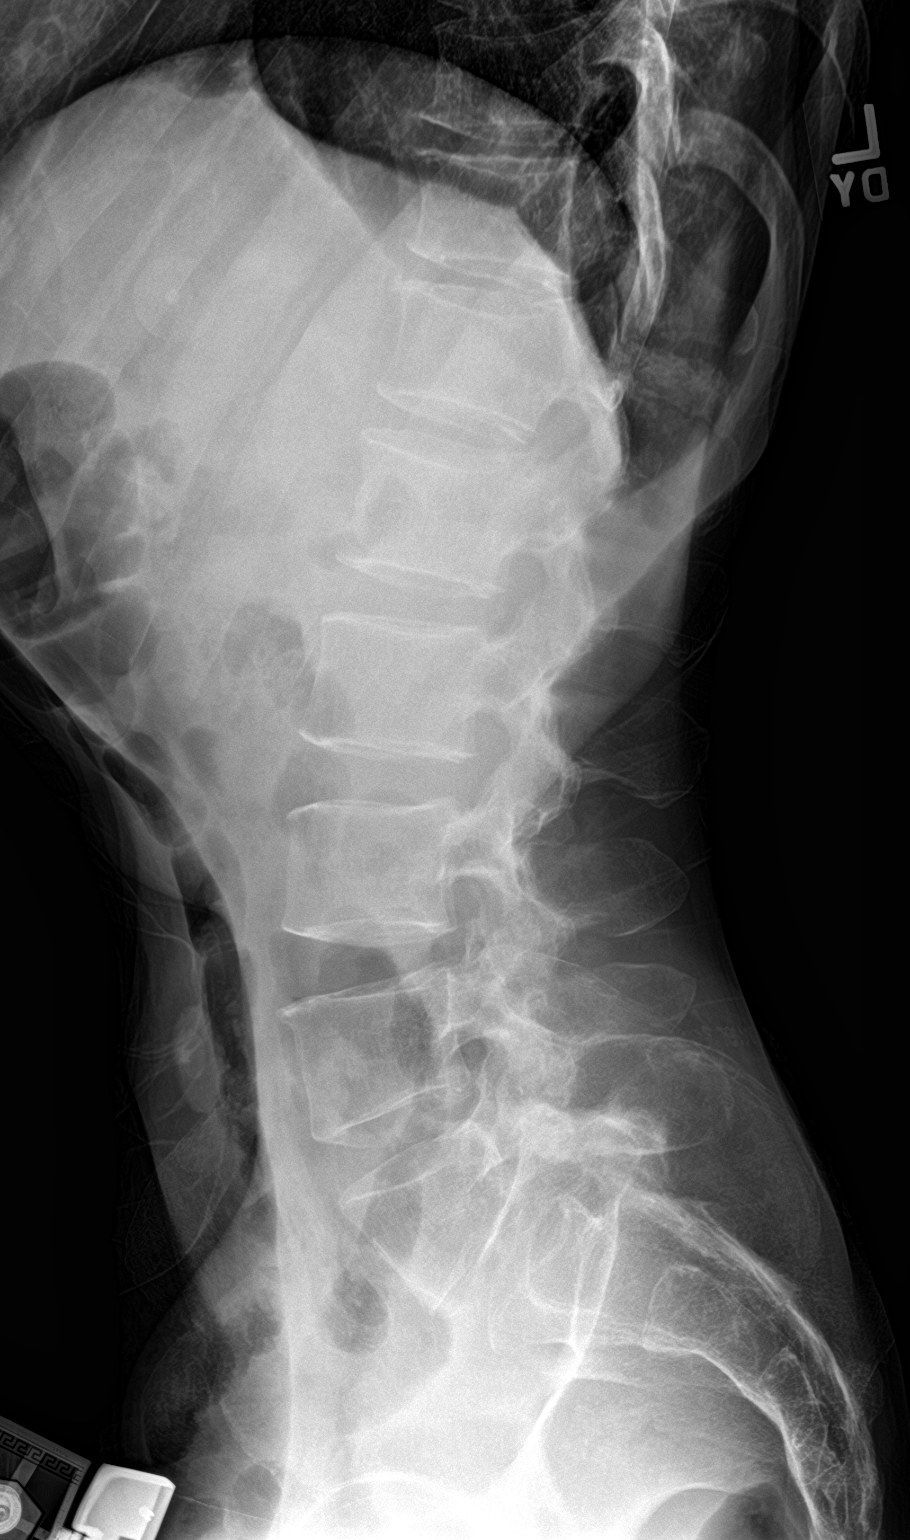
[im 3/3]
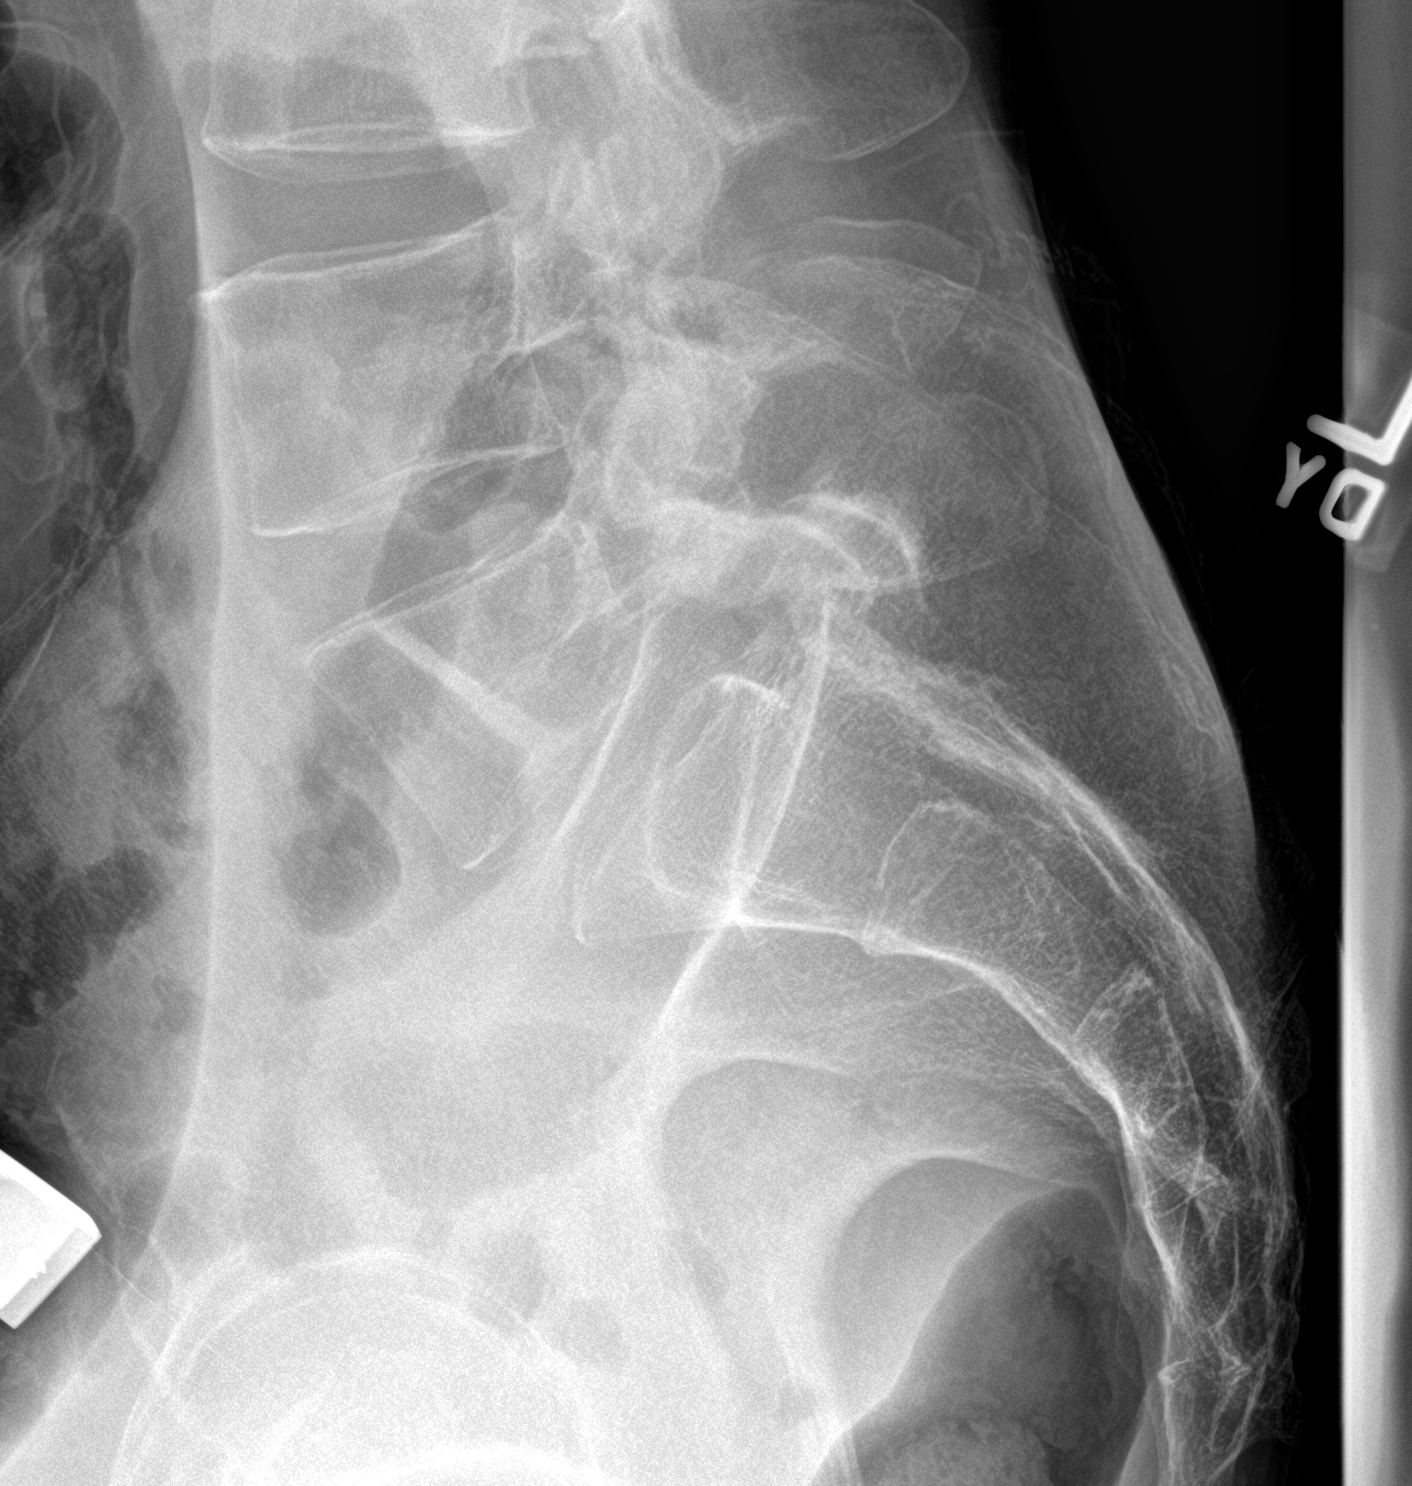

[3 of 3 positions shown; findings below may reference images not displayed]

FINDINGS: No fracture.

Grade 1 anterolisthesis of L5 on S1.  No other spondylolisthesis.

Disc spaces are well preserved.  Soft tissues are unremarkable.
IMPRESSION: 1. No fracture or acute finding.
2. Grade 1 anterolisthesis of L5 on S1. This is likely degenerative
in origin. No defined pars defects.

## 2020-07-26 IMAGING — DX DG CHEST 1V
1 series · 2 of 2 positions shown · non-contrast
Comparison: [DATE].

CLINICAL DATA: Chills and frequent falls. Low blood pressure today.
History of [4E] infection.

EXAM:
CHEST  1 VIEW

[Series 1: chest ap · 0.14mm/px · 2 of 2 slices shown]
[im 1/2]
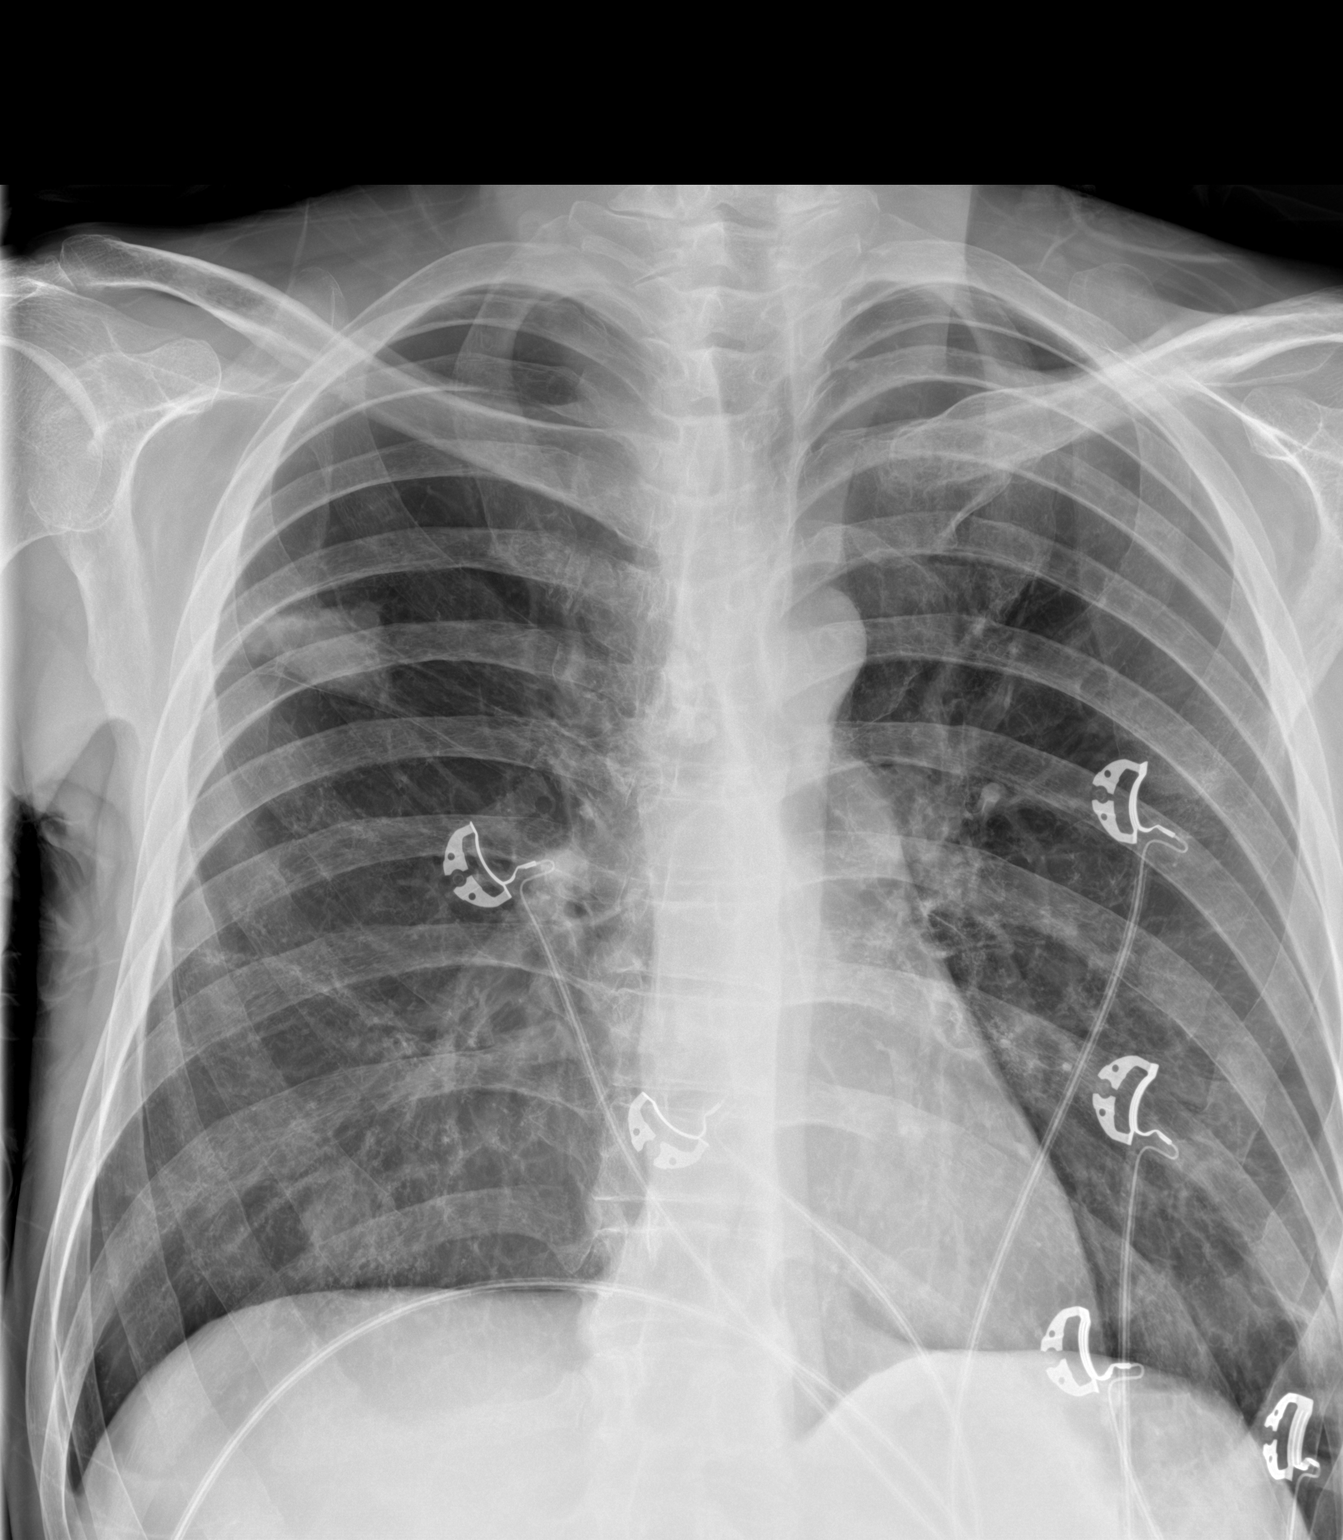
[im 2/2]
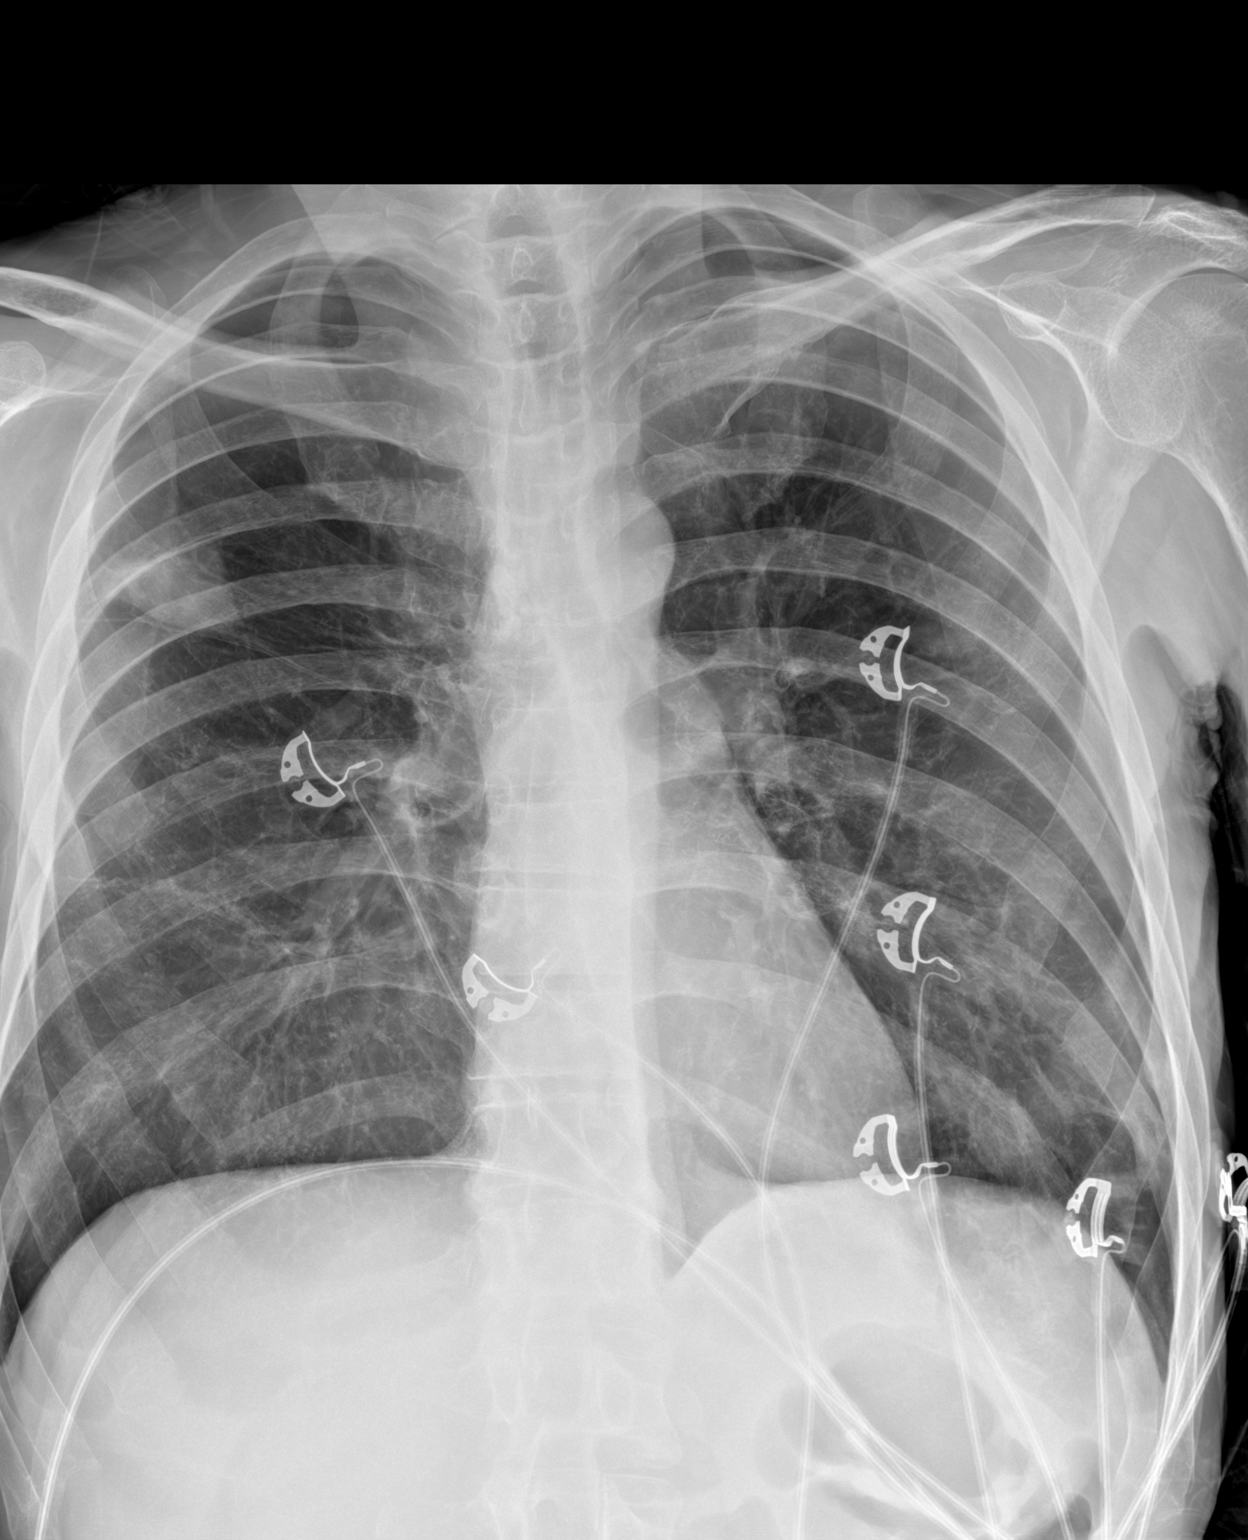

[2 of 2 positions shown; findings below may reference images not displayed]

FINDINGS: Masslike opacity in the right upper lobe bordering the minor
fissure, stable from the prior exam and from a CT dated [DATE].

Remainder of the lungs is clear. No pleural effusion or
pneumothorax.

Cardiac silhouette is normal in size. Normal mediastinal and hilar
contours.

Skeletal structures are grossly intact.
IMPRESSION: 1. No acute cardiopulmonary disease.
2. Stable masslike opacity in the right upper lobe adjacent to the
minor fissure. Although stable since the more recent prior
radiographs and a chest CT dated [DATE], the finding is new from
a more remote chest radiograph dated [DATE]. Neoplasm is not
excluded.

## 2020-07-26 MED ORDER — INSULIN GLARGINE 100 UNIT/ML ~~LOC~~ SOLN
8.0000 [IU] | Freq: Every day | SUBCUTANEOUS | Status: DC
  Administered 2020-07-26 – 2020-07-30 (×5): 8 [IU] via SUBCUTANEOUS
  Filled 2020-07-26 (×6): qty 0.08

## 2020-07-26 MED ORDER — SODIUM CHLORIDE 0.9 % IV SOLN: INTRAVENOUS | Status: DC

## 2020-07-26 MED ORDER — SODIUM CHLORIDE 0.9 % IV BOLUS
1000.0000 mL | Freq: Once | INTRAVENOUS | Status: AC
  Administered 2020-07-26: 1000 mL via INTRAVENOUS

## 2020-07-26 MED ORDER — ADULT MULTIVITAMIN W/MINERALS CH
1.0000 | ORAL_TABLET | Freq: Every day | ORAL | Status: DC
  Administered 2020-07-27 – 2020-07-31 (×5): 1 via ORAL
  Filled 2020-07-26 (×5): qty 1

## 2020-07-26 MED ORDER — PANTOPRAZOLE SODIUM 40 MG PO TBEC
40.0000 mg | DELAYED_RELEASE_TABLET | Freq: Every day | ORAL | Status: DC
  Administered 2020-07-27 – 2020-07-31 (×5): 40 mg via ORAL
  Filled 2020-07-26 (×5): qty 1

## 2020-07-26 MED ORDER — ENOXAPARIN SODIUM 30 MG/0.3ML ~~LOC~~ SOLN
30.0000 mg | SUBCUTANEOUS | Status: DC
  Administered 2020-07-26 – 2020-07-30 (×5): 30 mg via SUBCUTANEOUS
  Filled 2020-07-26 (×5): qty 0.3

## 2020-07-26 MED ORDER — ONDANSETRON HCL 4 MG/2ML IJ SOLN
4.0000 mg | Freq: Four times a day (QID) | INTRAMUSCULAR | Status: DC | PRN
  Administered 2020-07-27: 4 mg via INTRAVENOUS
  Filled 2020-07-26: qty 2

## 2020-07-26 MED ORDER — INSULIN ASPART 100 UNIT/ML ~~LOC~~ SOLN
0.0000 [IU] | Freq: Three times a day (TID) | SUBCUTANEOUS | Status: DC
  Administered 2020-07-27: 12:00:00 5 [IU] via SUBCUTANEOUS
  Administered 2020-07-27: 08:00:00 3 [IU] via SUBCUTANEOUS
  Administered 2020-07-27 – 2020-07-28 (×2): 1 [IU] via SUBCUTANEOUS
  Administered 2020-07-28: 17:00:00 3 [IU] via SUBCUTANEOUS
  Administered 2020-07-29: 2 [IU] via SUBCUTANEOUS
  Administered 2020-07-29: 19:00:00 5 [IU] via SUBCUTANEOUS
  Administered 2020-07-29 – 2020-07-30 (×2): 2 [IU] via SUBCUTANEOUS
  Administered 2020-07-30 – 2020-07-31 (×3): 5 [IU] via SUBCUTANEOUS
  Administered 2020-07-31: 7 [IU] via SUBCUTANEOUS
  Filled 2020-07-26 (×13): qty 1

## 2020-07-26 MED ORDER — SODIUM BICARBONATE 650 MG PO TABS
1300.0000 mg | ORAL_TABLET | Freq: Two times a day (BID) | ORAL | Status: DC
  Administered 2020-07-27 – 2020-07-31 (×9): 1300 mg via ORAL
  Filled 2020-07-26 (×11): qty 2

## 2020-07-26 MED ORDER — ACETAMINOPHEN 650 MG RE SUPP: 650.0000 mg | Freq: Four times a day (QID) | RECTAL | Status: DC | PRN

## 2020-07-26 MED ORDER — DULOXETINE HCL 30 MG PO CPEP
30.0000 mg | ORAL_CAPSULE | Freq: Every day | ORAL | Status: DC
  Administered 2020-07-27 – 2020-07-31 (×5): 30 mg via ORAL
  Filled 2020-07-26 (×5): qty 1

## 2020-07-26 MED ORDER — LOPERAMIDE HCL 2 MG PO CAPS
2.0000 mg | ORAL_CAPSULE | Freq: Two times a day (BID) | ORAL | Status: DC | PRN
  Administered 2020-07-29: 09:00:00 2 mg via ORAL
  Filled 2020-07-26: qty 1

## 2020-07-26 MED ORDER — GLUCERNA SHAKE PO LIQD
237.0000 mL | Freq: Three times a day (TID) | ORAL | Status: DC
  Administered 2020-07-27 – 2020-07-31 (×11): 237 mL via ORAL

## 2020-07-26 MED ORDER — INFLUENZA VAC SPLIT QUAD 0.5 ML IM SUSY
0.5000 mL | PREFILLED_SYRINGE | INTRAMUSCULAR | Status: DC
  Filled 2020-07-26: qty 0.5

## 2020-07-26 MED ORDER — ACETAMINOPHEN 325 MG PO TABS: 650.0000 mg | ORAL_TABLET | Freq: Four times a day (QID) | ORAL | Status: DC | PRN

## 2020-07-26 MED ORDER — ONDANSETRON HCL 4 MG PO TABS
4.0000 mg | ORAL_TABLET | Freq: Four times a day (QID) | ORAL | Status: DC | PRN
  Administered 2020-07-29: 18:00:00 4 mg via ORAL
  Filled 2020-07-26: qty 1

## 2020-07-26 MED ORDER — FERROUS SULFATE 325 (65 FE) MG PO TABS
325.0000 mg | ORAL_TABLET | Freq: Two times a day (BID) | ORAL | Status: DC
  Administered 2020-07-27 – 2020-07-31 (×9): 325 mg via ORAL
  Filled 2020-07-26 (×8): qty 1

## 2020-07-26 MED ORDER — ASPIRIN EC 81 MG PO TBEC
81.0000 mg | DELAYED_RELEASE_TABLET | Freq: Every day | ORAL | Status: DC
  Administered 2020-07-27 – 2020-07-31 (×5): 81 mg via ORAL
  Filled 2020-07-26 (×5): qty 1

## 2020-07-26 MED ORDER — BENZTROPINE MESYLATE 0.5 MG PO TABS
0.5000 mg | ORAL_TABLET | Freq: Two times a day (BID) | ORAL | Status: DC
  Administered 2020-07-26 – 2020-07-31 (×10): 0.5 mg via ORAL
  Filled 2020-07-26 (×11): qty 1

## 2020-07-26 MED ORDER — MEGESTROL ACETATE 400 MG/10ML PO SUSP
200.0000 mg | Freq: Every day | ORAL | Status: DC
  Administered 2020-07-27 – 2020-07-31 (×5): 200 mg via ORAL
  Filled 2020-07-26 (×5): qty 10

## 2020-07-26 MED ORDER — HALOPERIDOL 0.5 MG PO TABS
0.5000 mg | ORAL_TABLET | Freq: Two times a day (BID) | ORAL | Status: DC
  Administered 2020-07-26 – 2020-07-31 (×10): 0.5 mg via ORAL
  Filled 2020-07-26 (×11): qty 1

## 2020-07-26 NOTE — ED Notes (Signed)
Called back to floor. Transferred 2 times with no response. Sending patient to floor.

## 2020-07-26 NOTE — ED Notes (Signed)
Pt presents to ED via POV, [place], Interpreter in to triage with this RN. Pt c/o increasing weakness x several days, states has not eaten/had an appetite x 3 days. Pt states repetitive falls at home x several days. C/o back pain at this time from falls. Pt A&O x4. Upon arrival to triage room pt's BP 61/40, pt taken back to room by this RN, pt taken to room 11 by this RN and Barnie Alderman, [employer].

## 2020-07-26 NOTE — H&P (Addendum)
History and Physical    Gregory Moody UMP:[id number] DOB: 1976/06/29 DOA: 07/26/2020  PCP: [place]   Patient coming from: Home  I have personally briefly reviewed patient's old medical records in [place]  Chief Complaint: Generalized weakness/falls  HPI: Gregory Moody is a 44 y.o. male with medical history significant for diabetes mellitus, GERD, gastroparesis, DKA, anemia, tobacco abuse, esophageal candidiasis, COVID-19 infection 04/23/2019, recent C. difficile colitis, who presents to the ER for evaluation of generalized weakness and a fall ?? Syncopal episode. Patient states that he has had very poor oral intake and does not have an appetite.  He denies having any abdominal pain but has chronic diarrhea.  He denies having any nausea or vomiting.  Patient states that he had multiple falls 1 day prior to his admission and thinks he may have passed out. Upon arrival to the ER he was noted to have a blood pressure 61/40 and received IV fluid resuscitation.  Patient received 2 L of IV fluid with improvement in his blood pressure. He denies having any chest pain, no shortness of breath, no abdominal pain, no urinary symptoms, no nausea, no vomiting. Labs show sodium 128, potassium 3.8, chloride 103, bicarb 18, glucose 328, BUN 28, creatinine 2.68, calcium 8.6, anion gap 7, magnesium 2.1, alkaline phosphatase 73, albumin 3.1, AST 17, ALT 33, total protein 6.3, lactic acid 1.6, white count 5.0, hemoglobin 8.3, hematocrit 24.2, MCV 90.6, RDW 15.3, platelet count 272 Respiratory viral panel is negative Chest x-ray reviewed by me shows no acute cardiopulmonary disease Lumbar spine x-ray shows no acute findings Twelve-lead EKG shows sinus rhythm with early repolarization changes.     ED Course: Patient is a 44 year old [name] who presents to the emergency room for evaluation of generalized weakness and a fall.  He was hypotensive when he arrived  emergency room with a systolic blood pressure in the 60s requiring IV fluid resuscitation.  Blood pressure improved with IV fluid resuscitation but labs show worsening of his renal function from his baseline.  Patient will be admitted to the hospital for further evaluation.  Review of Systems: As per HPI otherwise 10 point review of systems negative.    Past Medical History:  Diagnosis Date  . COVID-19   . Diabetes mellitus without complication ([place])   . Gastroparesis   . Tuberculosis     Past Surgical History:  Procedure Laterality Date  . COLONOSCOPY N/A 01/25/2020   Procedure: COLONOSCOPY;  Surgeon: Toledo, Benay Pike, MD;  Location: [facility] ENDOSCOPY;  Service: Gastroenterology;  Laterality: N/A;  . ESOPHAGOGASTRODUODENOSCOPY N/A 02/03/2019   Procedure: ESOPHAGOGASTRODUODENOSCOPY (EGD);  Surgeon: Toledo, Benay Pike, MD;  Location: [facility] ENDOSCOPY;  Service: Gastroenterology;  Laterality: N/A;  . IR CATHETER TUBE CHANGE  06/13/2020     reports that he has quit smoking. His smoking use included cigars and cigarettes. He has never used smokeless tobacco. He reports that he does not drink alcohol and does not use drugs.  No Known Allergies  Family History  Problem Relation Age of Onset  . Diabetes Mother   . Diabetes Father      Prior to Admission medications   Medication Sig Start Date End Date Taking? Authorizing Provider  aspirin EC 81 MG tablet Take 81 mg by mouth daily.    [provider]  benztropine (COGENTIN) 0.5 MG tablet Take 1 tablet (0.5 mg total) by mouth 2 (two) times daily. 04/09/20   Pokhrel, Corrie Mckusick, MD  colesevelam (WELCHOL) 625 MG tablet Take  3 tablets (1,875 mg total) by mouth 2 (two) times daily with a meal. Patient not taking: Reported on 06/09/2020 02/09/20   Loletha Grayer, MD  DULoxetine (CYMBALTA) 30 MG capsule Take 1 capsule (30 mg total) by mouth daily. 04/10/20   Pokhrel, Corrie Mckusick, MD  feeding supplement, GLUCERNA SHAKE, (GLUCERNA SHAKE) LIQD Take 237 mLs  by mouth 3 (three) times daily between meals. 05/13/20   Fritzi Mandes, MD  ferrous sulfate 325 (65 FE) MG EC tablet Take 1 tablet (325 mg total) by mouth 2 (two) times daily. 05/26/20 09/23/20  [name], MD  haloperidol (HALDOL) 0.5 MG tablet Take 1 tablet (0.5 mg total) by mouth 2 (two) times daily. 04/09/20   Pokhrel, Corrie Mckusick, MD  insulin glargine (LANTUS) 100 UNIT/ML injection Inject 0.08 mLs (8 Units total) into the skin daily. 05/13/20   Fritzi Mandes, MD  loperamide (IMODIUM) 2 MG capsule Take 1 capsule (2 mg total) by mouth 2 (two) times daily as needed for diarrhea or loose stools. 02/09/20   Loletha Grayer, MD  Multiple Vitamin (MULTIVITAMIN WITH MINERALS) TABS tablet Take 1 tablet by mouth daily. 05/13/20   Fritzi Mandes, MD  NEXIUM 40 MG capsule Take 40 mg by mouth daily. 04/21/20   [provider]  pantoprazole (PROTONIX) 40 MG tablet Take 1 tablet (40 mg total) by mouth daily. Patient not taking: Reported on 06/09/2020 04/03/20   Clapacs, Madie Reno, MD  sodium bicarbonate 650 MG tablet Take 2 tablets (1,300 mg total) by mouth 2 (two) times daily. 04/09/20   Pokhrel, Corrie Mckusick, MD  sodium polystyrene (KAYEXALATE) 15 GM/60ML suspension Take 60 mLs (15 g total) by mouth every other day. 06/19/20   Sharen Hones, MD    Physical Exam: Vitals:   07/26/20 1159 07/26/20 1230 07/26/20 1330  BP: (!) 61/40 116/78 97/72  Pulse: 67 66 68  Resp: 20 14 12   Temp: 97.8 F (36.6 C)    TempSrc: Oral    SpO2: 100% 100% 100%  Weight: 45.4 kg    Height: 5\' 8"  (1.727 m)       Vitals:   07/26/20 1159 07/26/20 1230 07/26/20 1330  BP: (!) 61/40 116/78 97/72  Pulse: 67 66 68  Resp: 20 14 12   Temp: 97.8 F (36.6 C)    TempSrc: Oral    SpO2: 100% 100% 100%  Weight: 45.4 kg    Height: 5\' 8"  (1.727 m)      Constitutional: NAD, alert and oriented x 3, chronically ill-appearing, thin Eyes: PERRL, lids and conjunctivae normal ENMT: Mucous membranes are dry  Neck: normal, supple, no masses, no  thyromegaly Respiratory: clear to auscultation bilaterally, no wheezing, no crackles. Normal respiratory effort. No accessory muscle use.  Cardiovascular: Regular rate and rhythm, no murmurs / rubs / gallops. No extremity edema. 2+ pedal pulses. No carotid bruits.  Abdomen: Abdomen scaphoid, no tenderness, no masses palpated. No hepatosplenomegaly. Bowel sounds positive.  Musculoskeletal: no clubbing / cyanosis. No joint deformity upper and lower extremities.  Skin: no rashes, lesions, ulcers.  Neurologic: No gross focal neurologic deficit. Psychiatric: Normal mood and affect.   Labs on Admission: I have personally reviewed following labs and imaging studies  CBC: Recent Labs  Lab 07/26/20 1219  WBC 5.0  NEUTROABS 3.0  HGB 8.3*  HCT 24.2*  MCV 90.6  PLT 185   Basic Metabolic Panel: Recent Labs  Lab 07/26/20 1219  NA 128*  K 3.8  CL 103  CO2 18*  GLUCOSE 328*  BUN 28*  CREATININE  2.68*  CALCIUM 8.6*  MG 2.1   GFR: Estimated Creatinine Clearance: 22.4 mL/min (A) (by C-G formula based on SCr of 2.68 mg/dL (H)). Liver Function Tests: Recent Labs  Lab 07/26/20 1219  AST 17  ALT 33  ALKPHOS 73  BILITOT 0.5  PROT 6.3*  ALBUMIN 3.1*   No results for input(s): LIPASE, AMYLASE in the last 168 hours. No results for input(s): AMMONIA in the last 168 hours. Coagulation Profile: No results for input(s): INR, PROTIME in the last 168 hours. Cardiac Enzymes: No results for input(s): CKTOTAL, CKMB, CKMBINDEX, TROPONINI in the last 168 hours. BNP (last 3 results) No results for input(s): PROBNP in the last 8760 hours. HbA1C: No results for input(s): HGBA1C in the last 72 hours. CBG: No results for input(s): GLUCAP in the last 168 hours. Lipid Profile: No results for input(s): CHOL, HDL, LDLCALC, TRIG, CHOLHDL, LDLDIRECT in the last 72 hours. Thyroid Function Tests: No results for input(s): TSH, T4TOTAL, FREET4, T3FREE, THYROIDAB in the last 72 hours. Anemia Panel: No  results for input(s): VITAMINB12, FOLATE, FERRITIN, TIBC, IRON, RETICCTPCT in the last 72 hours. Urine analysis:    Component Value Date/Time   COLORURINE YELLOW (A) 06/09/2020 1320   APPEARANCEUR TURBID (A) 06/09/2020 1320   LABSPEC 1.011 06/09/2020 1320   PHURINE 6.0 06/09/2020 1320   GLUCOSEU NEGATIVE 06/09/2020 1320   HGBUR LARGE (A) 06/09/2020 1320   BILIRUBINUR NEGATIVE 06/09/2020 1320   KETONESUR NEGATIVE 06/09/2020 1320   PROTEINUR 100 (A) 06/09/2020 1320   NITRITE NEGATIVE 06/09/2020 1320   LEUKOCYTESUR LARGE (A) 06/09/2020 1320    Radiological Exams on Admission: DG Chest 1 View  Result Date: 07/26/2020 CLINICAL DATA:  Chills and frequent falls. Low blood pressure today. History of COVID-19 infection. EXAM: CHEST  1 VIEW COMPARISON:  06/09/2020. FINDINGS: Masslike opacity in the right upper lobe bordering the minor fissure, stable from the prior exam and from a CT dated 12/29/2019. Remainder of the lungs is clear. No pleural effusion or pneumothorax. Cardiac silhouette is normal in size. Normal mediastinal and hilar contours. Skeletal structures are grossly intact. IMPRESSION: 1. No acute cardiopulmonary disease. 2. Stable masslike opacity in the right upper lobe adjacent to the minor fissure. Although stable since the more recent prior radiographs and a chest CT dated 12/29/2019, the finding is new from a more remote chest radiograph dated 08/04/2015. Neoplasm is not excluded. Electronically Signed   By: Lajean Manes M.D.   On: 07/26/2020 13:18   DG Lumbar Spine 2-3 Views  Result Date: 07/26/2020 CLINICAL DATA:  Patient fell yesterday. Presents with low back pain. EXAM: LUMBAR SPINE - 2-3 VIEW COMPARISON:  None. FINDINGS: No fracture. Grade 1 anterolisthesis of L5 on S1.  No other spondylolisthesis. Disc spaces are well preserved.  Soft tissues are unremarkable. IMPRESSION: 1. No fracture or acute finding. 2. Grade 1 anterolisthesis of L5 on S1. This is likely degenerative in  origin. No defined pars defects. Electronically Signed   By: Lajean Manes M.D.   On: 07/26/2020 13:20    EKG: Independently reviewed.  Sinus rhythm Early repolarization changes  Assessment/Plan Principal Problem:   Generalized weakness Active Problems:   GERD (gastroesophageal reflux disease)   Hypotension   Tobacco abuse   Chronic diarrhea   CKD (chronic kidney disease), stage IV (HCC)   FTT (failure to thrive) in adult     Generalized weakness Multifactorial and secondary to poor oral intake as well as hypotension resulting in falls Encourage oral intake Place patient on  fall precautions Request physical therapy evaluation   Diabetes mellitus with complications of stage IV chronic kidney disease Maintain consistent carbohydrate diet Glycemic control with sliding scale insulin Follow-up with nephrology as an outpatient Baseline patient serum creatinine is 1.90 and today on admission it is 2.68 Repeat renal parameters in a.m.   Dehydration Secondary to poor oral intake At baseline patient has a serum creatinine of 1.90 and today on admission it is 2.68 Continue IV fluid hydration Repeat renal parameters in a.m.   Nicotine dependence Smoking cessation was discussed with patient in detail He declines a nicotine transdermal patch    Adult failure to thrive Patient has a BMI of 15.20 Continue nutritional supplements as well appetite stimulants    Hyponatremia Most likely secondary to hyperglycemia as well as poor oral intake Expect improvement in sodium levels following resolution of hyperglycemia   DVT prophylaxis: Lovenox Code Status: Full code Family Communication: Greater than 50% of time was spent discussing plan of care with patient at the bedside.  All questions and concerns have been addressed.  He verbalizes understanding and agrees with the plan. Disposition Plan: Back to previous home environment Consults called: Physical therapy    Draydon Clairmont MD [facility]     07/26/2020, 4:32 PM

## 2020-07-26 NOTE — ED Provider Notes (Signed)
[facility] Emergency Department Provider Note ____________________________________________   First MD Initiated Contact with Patient 07/26/20 1213     (approximate)  I have reviewed the triage vital signs and the nursing notes.   HISTORY  Chief Complaint Weakness  HPI Gregory Moody is a 44 y.o. male with multiple comorbidities as listed below presents to the emergency department for treatment and evaluation of dizziness, chills, and 3 or 4 falls over the past 24 hours.  He also states that he has not had an appetite and has not eaten in 3 days.  He denies known fever.  He also denies nausea, vomiting, or diarrhea.  He complains of pain in his lower back due to to falling.  He states that he has fallen because his legs have been weak.  He denies hitting his head or losing consciousness.         Past Medical History:  Diagnosis Date  . COVID-19   . Diabetes mellitus without complication ([place])   . Gastroparesis   . Tuberculosis     Patient Active Problem List   Diagnosis Date Noted  . Sepsis secondary to UTI ([place]) 06/12/2020  . E-coli UTI 06/12/2020  . UTI due to Klebsiella species 06/12/2020  . Hypothermia   . Hyponatremia   . Encephalopathy 06/09/2020  . Neurogenic bladder   . Penile abscess 05/06/2020  . FTT (failure to thrive) in adult 05/03/2020  . Weakness 05/02/2020  . CKD (chronic kidney disease), stage IV ([place]) 05/02/2020  . Type 1 diabetes mellitus with diabetic chronic kidney disease ([place])   . Goals of care, counseling/discussion   . Palliative care by specialist   . Recurrent major depression-severe ([place]) 03/31/2020  . Hyperkalemia   . Iron deficiency anemia   . Weight loss   . Chronic diarrhea 02/07/2020  . Abnormal CT of the chest   . Acute urinary retention 02/06/2020  . Hyperglycemia due to type 2 diabetes mellitus ([place]) 02/06/2020  . History of Clostridium difficile colitis 02/06/2020  . Generalized weakness  02/06/2020  . History of COVID-19 02/06/2020  . Diarrhea 01/21/2020  . Tobacco abuse 01/21/2020  . C. difficile colitis 01/21/2020  . Urinary retention 01/21/2020  . Diabetes mellitus without complication ([place]) 82/99/3716  . Protein-calorie malnutrition, severe 01/02/2020  . Odynophagia   . DKA (diabetic ketoacidoses) 12/27/2019  . Depression 12/27/2019  . DKA, type 2 ([place]) 12/27/2019  . Anemia of chronic disease 04/25/2019  . Hypomagnesemia 04/25/2019  . Hypotension 04/23/2019  . CAP (community acquired pneumonia) due to MSSA (methicillin sensitive Staphylococcus aureus) ([place]) 04/14/2019  . COVID-19 virus infection 04/14/2019  . Pressure injury of skin 03/31/2019  . Acute renal failure (ARF) ([place]) 03/06/2019  . CAP (community acquired pneumonia) 02/14/2019  . AKI (acute kidney injury) ([place]) 02/02/2019  . ARF (acute renal failure) ([place]) 01/03/2019  . Malnutrition of moderate degree 02/24/2018  . GERD (gastroesophageal reflux disease) 02/23/2018  . Diabetic gastroparesis ([place]) 02/23/2018  . Diabetic foot ulcer ([place]) 02/23/2018  . Aspiration pneumonia ([place]) 04/21/2017  . Hypoglycemia 04/21/2017  . Diabetes mellitus with hyperglycemia ([place]) 04/21/2017  . Hip fracture, unspecified laterality, closed, initial encounter ([place]) 04/17/2017  . Hyperglycemia 04/17/2017  . Malnutrition ([place]) 04/09/2016  . Cavitary lesion of lung 04/06/2016    Past Surgical History:  Procedure Laterality Date  . COLONOSCOPY N/A 01/25/2020   Procedure: COLONOSCOPY;  Surgeon: Toledo, Benay Pike, MD;  Location: [facility] ENDOSCOPY;  Service: Gastroenterology;  Laterality: N/A;  . ESOPHAGOGASTRODUODENOSCOPY N/A 02/03/2019  Procedure: ESOPHAGOGASTRODUODENOSCOPY (EGD);  Surgeon: Toledo, Benay Pike, MD;  Location: [facility] ENDOSCOPY;  Service: Gastroenterology;  Laterality: N/A;  . IR CATHETER TUBE CHANGE  06/13/2020    Prior to Admission medications   Medication Sig Start Date End Date Taking? Authorizing Provider    aspirin EC 81 MG tablet Take 81 mg by mouth daily.    [provider]  benztropine (COGENTIN) 0.5 MG tablet Take 1 tablet (0.5 mg total) by mouth 2 (two) times daily. 04/09/20   Pokhrel, Corrie Mckusick, MD  colesevelam (WELCHOL) 625 MG tablet Take 3 tablets (1,875 mg total) by mouth 2 (two) times daily with a meal. Patient not taking: Reported on 06/09/2020 02/09/20   Loletha Grayer, MD  DULoxetine (CYMBALTA) 30 MG capsule Take 1 capsule (30 mg total) by mouth daily. 04/10/20   Pokhrel, Corrie Mckusick, MD  feeding supplement, GLUCERNA SHAKE, (GLUCERNA SHAKE) LIQD Take 237 mLs by mouth 3 (three) times daily between meals. 05/13/20   Fritzi Mandes, MD  ferrous sulfate 325 (65 FE) MG EC tablet Take 1 tablet (325 mg total) by mouth 2 (two) times daily. 05/26/20 09/23/20  [name], MD  haloperidol (HALDOL) 0.5 MG tablet Take 1 tablet (0.5 mg total) by mouth 2 (two) times daily. 04/09/20   Pokhrel, Corrie Mckusick, MD  insulin glargine (LANTUS) 100 UNIT/ML injection Inject 0.08 mLs (8 Units total) into the skin daily. 05/13/20   Fritzi Mandes, MD  loperamide (IMODIUM) 2 MG capsule Take 1 capsule (2 mg total) by mouth 2 (two) times daily as needed for diarrhea or loose stools. 02/09/20   Loletha Grayer, MD  Multiple Vitamin (MULTIVITAMIN WITH MINERALS) TABS tablet Take 1 tablet by mouth daily. 05/13/20   Fritzi Mandes, MD  NEXIUM 40 MG capsule Take 40 mg by mouth daily. 04/21/20   [provider]  pantoprazole (PROTONIX) 40 MG tablet Take 1 tablet (40 mg total) by mouth daily. Patient not taking: Reported on 06/09/2020 04/03/20   Clapacs, Madie Reno, MD  sodium bicarbonate 650 MG tablet Take 2 tablets (1,300 mg total) by mouth 2 (two) times daily. 04/09/20   Pokhrel, Corrie Mckusick, MD  sodium polystyrene (KAYEXALATE) 15 GM/60ML suspension Take 60 mLs (15 g total) by mouth every other day. 06/19/20   Sharen Hones, MD    Allergies Patient has no known allergies.  Family History  Problem Relation Age of Onset  . Diabetes Mother   .  Diabetes Father     Social History Social History   Tobacco Use  . Smoking status: Former Smoker    Types: Cigars, Cigarettes  . Smokeless tobacco: Never Used  Vaping Use  . Vaping Use: Never used  Substance Use Topics  . Alcohol use: No  . Drug use: No    Review of Systems  Constitutional: Positive for chills.  Positive for lack of appetite Eyes: No visual changes. ENT: No sore throat. Cardiovascular: Denies chest pain. Respiratory: Denies shortness of breath. Gastrointestinal: No abdominal pain.  No nausea, no vomiting.  No diarrhea.  No constipation. Genitourinary: Negative for dysuria. Musculoskeletal: Positive for back pain. Skin: Negative for rash. Neurological: Negative for headaches, focal weakness or numbness. ____________________________________________   PHYSICAL EXAM:  VITAL SIGNS: ED Triage Vitals [07/26/20 1159]  Enc Vitals Group     BP (!) 61/40     Pulse Rate 67     Resp 20     Temp 97.8 F (36.6 C)     Temp Source Oral     SpO2 100 %  Weight 100 lb (45.4 kg)     Height 5\' 8"  (1.727 m)     Head Circumference      Peak Flow      Pain Score 8     Pain Loc      Pain Edu?      Excl. in [place]?     Constitutional: Alert and oriented.  Chronically ill appearing and in no acute distress. Eyes: Conjunctivae are normal. PERRL.  Head: Atraumatic. Nose: No congestion/rhinnorhea. Mouth/Throat: Mucous membranes are moist.  Oropharynx non-erythematous. Neck: No stridor.   Hematological/Lymphatic/Immunilogical: No cervical lymphadenopathy. Cardiovascular: Normal rate, regular rhythm. Grossly normal heart sounds.  Good peripheral circulation. Respiratory: Normal respiratory effort.  No retractions. Lungs CTAB. Gastrointestinal: Soft and nontender. No distention. No abdominal bruits. Genitourinary:  Musculoskeletal: No lower extremity tenderness nor edema.  No joint effusions. Neurologic:  Normal speech and language. No gross focal neurologic deficits  are appreciated. No gait instability. Skin:  Skin is warm, dry and intact. No rash noted. Psychiatric: Mood and affect are normal. Speech and behavior are normal.  ____________________________________________   LABS (all labs ordered are listed, but only abnormal results are displayed)  Labs Reviewed  CBC WITH DIFFERENTIAL/PLATELET - Abnormal; Notable for the following components:      Result Value   RBC 2.67 (*)    Hemoglobin 8.3 (*)    HCT 24.2 (*)    All other components within normal limits  COMPREHENSIVE METABOLIC PANEL - Abnormal; Notable for the following components:   Sodium 128 (*)    CO2 18 (*)    Glucose, Bld 328 (*)    BUN 28 (*)    Creatinine, Ser 2.68 (*)    Calcium 8.6 (*)    Total Protein 6.3 (*)    Albumin 3.1 (*)    GFR, Estimated 27 (*)    All other components within normal limits  RESPIRATORY PANEL BY RT PCR (FLU A&B, COVID)  LACTIC ACID, PLASMA  MAGNESIUM  URINALYSIS, COMPLETE (UACMP) WITH MICROSCOPIC  TROPONIN I (HIGH SENSITIVITY)  TROPONIN I (HIGH SENSITIVITY)   ____________________________________________  EKG  ED ECG REPORT I, Vidur Knust, FNP-BC personally viewed and interpreted this ECG.   Date: 07/26/2020  EKG Time: 1207  Rate: 65  Rhythm: normal sinus rhythm  Axis: normal  Intervals:none  ST&T Change: ST elevation V3  ____________________________________________  RADIOLOGY  ED MD interpretation:    Lumbar image is negative for acute findings.  Chest x-ray without acute findings. Stable appearing opacity in the right upper lobe.  I, Sherrie George, personally viewed and evaluated these images (plain radiographs) as part of my medical decision making, as well as reviewing the written report by the radiologist.  Official radiology report(s): DG Chest 1 View  Result Date: 07/26/2020 CLINICAL DATA:  Chills and frequent falls. Low blood pressure today. History of COVID-19 infection. EXAM: CHEST  1 VIEW COMPARISON:  06/09/2020.  FINDINGS: Masslike opacity in the right upper lobe bordering the minor fissure, stable from the prior exam and from a CT dated 12/29/2019. Remainder of the lungs is clear. No pleural effusion or pneumothorax. Cardiac silhouette is normal in size. Normal mediastinal and hilar contours. Skeletal structures are grossly intact. IMPRESSION: 1. No acute cardiopulmonary disease. 2. Stable masslike opacity in the right upper lobe adjacent to the minor fissure. Although stable since the more recent prior radiographs and a chest CT dated 12/29/2019, the finding is new from a more remote chest radiograph dated 08/04/2015. Neoplasm is not excluded. Electronically Signed  By: Lajean Manes M.D.   On: 07/26/2020 13:18   DG Lumbar Spine 2-3 Views  Result Date: 07/26/2020 CLINICAL DATA:  Patient fell yesterday. Presents with low back pain. EXAM: LUMBAR SPINE - 2-3 VIEW COMPARISON:  None. FINDINGS: No fracture. Grade 1 anterolisthesis of L5 on S1.  No other spondylolisthesis. Disc spaces are well preserved.  Soft tissues are unremarkable. IMPRESSION: 1. No fracture or acute finding. 2. Grade 1 anterolisthesis of L5 on S1. This is likely degenerative in origin. No defined pars defects. Electronically Signed   By: Lajean Manes M.D.   On: 07/26/2020 13:20    ____________________________________________   PROCEDURES  Procedure(s) performed (including Critical Care):  Procedures  ____________________________________________   INITIAL IMPRESSION / ASSESSMENT AND PLAN   44 year old male presenting to the emergency department for evaluation of symptoms as described in the HPI. He is noted to be hypotensive, however he is awake, alert, and oriented and able to converse. Plan will be to get labs, chest and lumbar imaging and give IV fluids.    DIFFERENTIAL DIAGNOSIS  Dehydration, symptomatic anemia, failure to thrive  ED COURSE  Repeat BP after 52ml of fluid 116/78. Awaiting lab results.  Labs show  worsening kidney function. Chest x-ray and lumbar spine imaging is negative for acute findings. Blood pressure down to 97/72. Plan will be to admit for rehydration and possible palliative care consult.    ___________________________________________   FINAL CLINICAL IMPRESSION(S) / ED DIAGNOSES  Final diagnoses:  Weakness  Dehydration  AKI (acute kidney injury) [employer])     ED Discharge Orders    None       Gregory Moody was evaluated in Emergency Department on 07/26/2020 for the symptoms described in the history of present illness. He was evaluated in the context of the global COVID-19 pandemic, which necessitated consideration that the patient might be at risk for infection with the SARS-CoV-2 virus that causes COVID-19. Institutional protocols and algorithms that pertain to the evaluation of patients at risk for COVID-19 are in a state of rapid change based on information released by regulatory bodies including the CDC and federal and state organizations. These policies and algorithms were followed during the patient's care in the ED.   Note:  This document was prepared using Dragon voice recognition software and may include unintentional dictation errors.   Victorino Dike, FNP 07/26/20 1425    Merlyn Lot, MD 07/26/20 [phone]

## 2020-07-26 NOTE — ED Notes (Signed)
Attempted report to floor. Advised they are not ready and to call back.

## 2020-07-26 NOTE — ED Notes (Signed)
Attempted

## 2020-07-27 LAB — GLUCOSE, CAPILLARY
Glucose-Capillary: 232 mg/dL — ABNORMAL HIGH (ref 70–99)
Glucose-Capillary: 281 mg/dL — ABNORMAL HIGH (ref 70–99)
Glucose-Capillary: 132 mg/dL — ABNORMAL HIGH (ref 70–99)
Glucose-Capillary: 201 mg/dL — ABNORMAL HIGH (ref 70–99)

## 2020-07-27 LAB — BASIC METABOLIC PANEL
Anion gap: 7 (ref 5–15)
BUN: 25 mg/dL — ABNORMAL HIGH (ref 6–20)
CO2: 18 mmol/L — ABNORMAL LOW (ref 22–32)
Calcium: 8.3 mg/dL — ABNORMAL LOW (ref 8.9–10.3)
Chloride: 107 mmol/L (ref 98–111)
Creatinine, Ser: 2.3 mg/dL — ABNORMAL HIGH (ref 0.61–1.24)
GFR, Estimated: 33 mL/min — ABNORMAL LOW (ref >60)
Glucose, Bld: 263 mg/dL — ABNORMAL HIGH (ref 70–99)
Potassium: 4 mmol/L (ref 3.5–5.1)
Sodium: 132 mmol/L — ABNORMAL LOW (ref 135–145)

## 2020-07-27 LAB — CBC
HCT: 22.5 % — ABNORMAL LOW (ref 39.0–52.0)
Hemoglobin: 8 g/dL — ABNORMAL LOW (ref 13.0–17.0)
MCH: 31.3 pg (ref 26.0–34.0)
MCHC: 35.6 g/dL (ref 30.0–36.0)
MCV: 87.9 fL (ref 80.0–100.0)
Platelets: 238 10*3/uL (ref 150–400)
RBC: 2.56 MIL/uL — ABNORMAL LOW (ref 4.22–5.81)
RDW: 14.8 % (ref 11.5–15.5)
WBC: 4.3 10*3/uL (ref 4.0–10.5)
nRBC: 0 % (ref 0.0–0.2)

## 2020-07-27 MED ORDER — ALUM & MAG HYDROXIDE-SIMETH 200-200-20 MG/5ML PO SUSP
15.0000 mL | Freq: Four times a day (QID) | ORAL | Status: DC | PRN
  Administered 2020-07-27: 17:00:00 15 mL via ORAL
  Filled 2020-07-27: qty 30

## 2020-07-27 MED ORDER — CALCIUM CARBONATE ANTACID 500 MG PO CHEW
800.0000 mg | CHEWABLE_TABLET | Freq: Once | ORAL | Status: AC
  Administered 2020-07-27: 800 mg via ORAL
  Filled 2020-07-27: qty 4

## 2020-07-27 MED ORDER — INFLUENZA VAC SPLIT QUAD 0.5 ML IM SUSY
0.5000 mL | PREFILLED_SYRINGE | INTRAMUSCULAR | Status: AC
  Administered 2020-07-31: 10:00:00 0.5 mL via INTRAMUSCULAR
  Filled 2020-07-27 (×2): qty 0.5

## 2020-07-27 NOTE — Progress Notes (Signed)
Physical Therapy Evaluation Patient Details Name: Gregory Moody MRN: [id number] DOB: 1976-06-17 Today's Date: 07/27/2020   History of Present Illness  Per MD note:Gregory Moody is a 44 y.o. male with medical history significant fordiabetes mellitus, GERD, gastroparesis, DKA, anemia, tobacco abuse, esophageal candidiasis, COVID-19 infection 04/23/2019, recent C. difficile colitis,who presents to the ER for evaluation of generalized weakness and a fall ?? Syncopal episode.  Clinical Impression  Patient agrees to PT evaluation. Patient is MI with bed mobility, transfers sit to stand with IV pole, ambulates with MI with IV pole for 10 feet. He has weakness in BLE 3/5 hip/knee. He reports " pain everywhere from his fall" but is not able to say exactly where or use a pain scale. He is MI for sitting and standing balance. He ambulates with a quad cane at home and has had recent falls.  PT session is interrupted by MD Nolberto Hanlon who enters the room saying that she has not had a chance to see this patient today and abruptly begins talking to the patient. PT session ends. Patient will benefit from skilled PT to improve mobility and strength.     Follow Up Recommendations Home health PT    Equipment Recommendations  None recommended by PT    Recommendations for Other Services       Precautions / Restrictions Precautions Precautions: Fall Restrictions Weight Bearing Restrictions: No      Mobility  Bed Mobility Overal bed mobility: Modified Independent                Transfers Overall transfer level: Modified independent Equipment used: None             General transfer comment: used the IV pole  Ambulation/Gait Ambulation/Gait assistance: Modified independent (Device/Increase time) Gait Distance (Feet): 10 Feet Assistive device: IV Pole Gait Pattern/deviations: Step-to pattern        Stairs            Wheelchair Mobility    Modified Rankin  (Stroke Patients Only)       Balance Overall balance assessment: Modified Independent                                           Pertinent Vitals/Pain Pain Assessment: No/denies pain    Home Living Family/patient expects to be discharged to:: Private residence Living Arrangements: Parent Available Help at Discharge: Family   Home Access: Stairs to enter       Home Equipment: Radio producer - quad      Prior Function Level of Independence: Independent with assistive device(s)               Hand Dominance        Extremity/Trunk Assessment        Lower Extremity Assessment Lower Extremity Assessment: Overall WFL for tasks assessed       Communication   Communication: No difficulties  Cognition Arousal/Alertness: Awake/alert Behavior During Therapy: WFL for tasks assessed/performed Overall Cognitive Status: Within Functional Limits for tasks assessed                                        General Comments      Exercises     Assessment/Plan    PT Assessment Patient needs continued PT services  PT Problem List  Decreased strength;Decreased activity tolerance       PT Treatment Interventions Gait training;Therapeutic activities;Therapeutic exercise    PT Goals (Current goals can be found in the Care Plan section)  Acute Rehab PT Goals Patient Stated Goal: no goals stated PT Goal Formulation: Patient unable to participate in goal setting Time For Goal Achievement: 08/10/20 Potential to Achieve Goals: Fair    Frequency Min 2X/week   Barriers to discharge        Co-evaluation               AM-PAC PT "6 Clicks" Mobility  Outcome Measure Help needed turning from your back to your side while in a flat bed without using bedrails?: None Help needed moving from lying on your back to sitting on the side of a flat bed without using bedrails?: None Help needed moving to and from a bed to a chair (including a  wheelchair)?: None Help needed standing up from a chair using your arms (e.g., wheelchair or bedside chair)?: None Help needed to walk in hospital room?: None Help needed climbing 3-5 steps with a railing? : A Little 6 Click Score: 23    End of Session Equipment Utilized During Treatment: Oxygen Activity Tolerance: Patient tolerated treatment well Patient left: in bed (MD interuppted PT treatment and pt left with MD) Nurse Communication: Mobility status PT Visit Diagnosis: Muscle weakness (generalized) (M62.81);History of falling (Z91.81)    Time: 3846-6599 PT Time Calculation (min) (ACUTE ONLY): 15 min   Charges:   PT Evaluation $PT Eval Low Complexity: 1 Low PT Treatments $Gait Training: 8-22 mins          Arelia Sneddon S, PT DPT 07/27/2020, 11:22 AM

## 2020-07-27 NOTE — Progress Notes (Signed)
PROGRESS NOTE    Gregory Moody  WKM:[id number] DOB: July 07, 1976 DOA: 07/26/2020 PCP: [facility]    Brief Narrative:  Gregory Moody is a 44 y.o. male with medical history significant fordiabetes mellitus, GERD, gastroparesis, DKA, anemia, tobacco abuse, esophageal candidiasis, COVID-19 infection 04/23/2019, recent C. difficile colitis,who presents to the ER for evaluation of generalized weakness and a fall ?? Syncopal episode. Patient states that he has had very poor oral intake and does not have an appetite.  He denies having any abdominal pain but has chronic diarrhea.  He denies having any nausea or vomiting.  Patient states that he had multiple falls 1 day prior to his admission and thinks he may have passed out.Upon arrival to the ER he was noted to have a blood pressure 61/40 and received IV fluid resuscitation.  Patient received 2 L of IV fluid with improvement in his blood pressure.  10/17-no overnight issues  Consultants:     Procedures:   Antimicrobials:       Subjective: Patient without any complaints.  Encouraged him to increase his p.o. intake and he said yes.  He was in the bathroom earlier and still has chronic diarrhea at times.  Objective: Vitals:   07/26/20 2132 07/27/20 0014 07/27/20 0430 07/27/20 0835  BP: 125/89 (!) 146/98 100/70 120/78  Pulse: 61 61 63 68  Resp: 16 16 16 17   Temp: 97.6 F (36.4 C) 97.7 F (36.5 C) 98 F (36.7 C) 98.1 F (36.7 C)  TempSrc: Oral Oral Oral Oral  SpO2: 100% 100% 100%   Weight: 44 kg     Height: 5\' 8"  (1.727 m)       Intake/Output Summary (Last 24 hours) at 07/27/2020 1242 Last data filed at 07/27/2020 0949 Gross per 24 hour  Intake 1121.41 ml  Output --  Net 1121.41 ml   Filed Weights   07/26/20 1159 07/26/20 2132  Weight: 45.4 kg 44 kg    Examination:  General exam: Appears calm and comfortable  Respiratory system: Clear to auscultation. Respiratory effort  normal. Cardiovascular system: S1 & S2 heard, RRR. No JVD, murmurs, rubs, gallops or clicks. No pedal edema. Gastrointestinal system: Abdomen is nondistended, soft and nontender.  Normal bowel sounds heard. Central nervous system: Alert and oriented. No focal neurological deficits. Extremities: No edema Skin: Warm dry Psychiatry: Mood & affect appropriate in current setting.     Data Reviewed: I have personally reviewed following labs and imaging studies  CBC: Recent Labs  Lab 07/26/20 1219 07/27/20 0534  WBC 5.0 4.3  NEUTROABS 3.0  --   HGB 8.3* 8.0*  HCT 24.2* 22.5*  MCV 90.6 87.9  PLT 272 116   Basic Metabolic Panel: Recent Labs  Lab 07/26/20 1219 07/27/20 0534  NA 128* 132*  K 3.8 4.0  CL 103 107  CO2 18* 18*  GLUCOSE 328* 263*  BUN 28* 25*  CREATININE 2.68* 2.30*  CALCIUM 8.6* 8.3*  MG 2.1  --    GFR: Estimated Creatinine Clearance: 25.2 mL/min (A) (by C-G formula based on SCr of 2.3 mg/dL (H)). Liver Function Tests: Recent Labs  Lab 07/26/20 1219  AST 17  ALT 33  ALKPHOS 73  BILITOT 0.5  PROT 6.3*  ALBUMIN 3.1*   No results for input(s): LIPASE, AMYLASE in the last 168 hours. No results for input(s): AMMONIA in the last 168 hours. Coagulation Profile: No results for input(s): INR, PROTIME in the last 168 hours. Cardiac Enzymes: No results for input(s): CKTOTAL, CKMB, CKMBINDEX,  TROPONINI in the last 168 hours. BNP (last 3 results) No results for input(s): PROBNP in the last 8760 hours. HbA1C: No results for input(s): HGBA1C in the last 72 hours. CBG: Recent Labs  Lab 07/27/20 0735 07/27/20 1132  GLUCAP 232* 281*   Lipid Profile: No results for input(s): CHOL, HDL, LDLCALC, TRIG, CHOLHDL, LDLDIRECT in the last 72 hours. Thyroid Function Tests: No results for input(s): TSH, T4TOTAL, FREET4, T3FREE, THYROIDAB in the last 72 hours. Anemia Panel: No results for input(s): VITAMINB12, FOLATE, FERRITIN, TIBC, IRON, RETICCTPCT in the last 72  hours. Sepsis Labs: Recent Labs  Lab 07/26/20 1219  LATICACIDVEN 1.6    Recent Results (from the past 240 hour(s))  Respiratory Panel by RT PCR (Flu A&B, Covid) - Nasopharyngeal Swab     Status: None   Collection Time: 07/26/20  1:23 PM   Specimen: Nasopharyngeal Swab  Result Value Ref Range Status   SARS Coronavirus 2 by RT PCR NEGATIVE NEGATIVE Final    Comment: (NOTE) SARS-CoV-2 target nucleic acids are NOT DETECTED.  The SARS-CoV-2 RNA is generally detectable in upper respiratoy specimens during the acute phase of infection. The lowest concentration of SARS-CoV-2 viral copies this assay can detect is 131 copies/mL. A negative result does not preclude SARS-Cov-2 infection and should not be used as the sole basis for treatment or other patient management decisions. A negative result may occur with  improper specimen collection/handling, submission of specimen other than nasopharyngeal swab, presence of viral mutation(s) within the areas targeted by this assay, and inadequate number of viral copies (<131 copies/mL). A negative result must be combined with clinical observations, patient history, and epidemiological information. The expected result is Negative.  Fact Sheet for Patients:  PinkCheek.be  Fact Sheet for Healthcare Providers:  GravelBags.it  This test is no t yet approved or cleared by the [place] FDA and  has been authorized for detection and/or diagnosis of SARS-CoV-2 by FDA under an Emergency Use Authorization (EUA). This EUA will remain  in effect (meaning this test can be used) for the duration of the COVID-19 declaration under Section 564(b)(1) of the Act, 21 U.S.C. section 360bbb-3(b)(1), unless the authorization is terminated or revoked sooner.     Influenza A by PCR NEGATIVE NEGATIVE Final   Influenza B by PCR NEGATIVE NEGATIVE Final    Comment: (NOTE) The Xpert Xpress SARS-CoV-2/FLU/RSV  assay is intended as an aid in  the diagnosis of influenza from Nasopharyngeal swab specimens and  should not be used as a sole basis for treatment. Nasal washings and  aspirates are unacceptable for Xpert Xpress SARS-CoV-2/FLU/RSV  testing.  Fact Sheet for Patients: PinkCheek.be  Fact Sheet for Healthcare Providers: GravelBags.it  This test is not yet approved or cleared by the [place] FDA and  has been authorized for detection and/or diagnosis of SARS-CoV-2 by  FDA under an Emergency Use Authorization (EUA). This EUA will remain  in effect (meaning this test can be used) for the duration of the  Covid-19 declaration under Section 564(b)(1) of the Act, 21  U.S.C. section 360bbb-3(b)(1), unless the authorization is  terminated or revoked. Performed at [facility], [address], [place], [place] [address]          Radiology Studies: DG Chest 1 View  Result Date: 07/26/2020 CLINICAL DATA:  Chills and frequent falls. Low blood pressure today. History of COVID-19 infection. EXAM: CHEST  1 VIEW COMPARISON:  06/09/2020. FINDINGS: Masslike opacity in the right upper lobe bordering the minor fissure,  stable from the prior exam and from a CT dated 12/29/2019. Remainder of the lungs is clear. No pleural effusion or pneumothorax. Cardiac silhouette is normal in size. Normal mediastinal and hilar contours. Skeletal structures are grossly intact. IMPRESSION: 1. No acute cardiopulmonary disease. 2. Stable masslike opacity in the right upper lobe adjacent to the minor fissure. Although stable since the more recent prior radiographs and a chest CT dated 12/29/2019, the finding is new from a more remote chest radiograph dated 08/04/2015. Neoplasm is not excluded. Electronically Signed   By: Lajean Manes M.D.   On: 07/26/2020 13:18   DG Lumbar Spine 2-3 Views  Result Date: 07/26/2020 CLINICAL DATA:  Patient fell  yesterday. Presents with low back pain. EXAM: LUMBAR SPINE - 2-3 VIEW COMPARISON:  None. FINDINGS: No fracture. Grade 1 anterolisthesis of L5 on S1.  No other spondylolisthesis. Disc spaces are well preserved.  Soft tissues are unremarkable. IMPRESSION: 1. No fracture or acute finding. 2. Grade 1 anterolisthesis of L5 on S1. This is likely degenerative in origin. No defined pars defects. Electronically Signed   By: Lajean Manes M.D.   On: 07/26/2020 13:20        Scheduled Meds: . aspirin EC  81 mg Oral Daily  . benztropine  0.5 mg Oral BID  . DULoxetine  30 mg Oral Daily  . enoxaparin (LOVENOX) injection  30 mg Subcutaneous Q24H  . feeding supplement (GLUCERNA SHAKE)  237 mL Oral TID BM  . ferrous sulfate  325 mg Oral BID PC  . haloperidol  0.5 mg Oral BID  . [START ON 07/28/2020] influenza vac split quadrivalent PF  0.5 mL Intramuscular Tomorrow-1000  . insulin aspart  0-9 Units Subcutaneous TID WC  . insulin glargine  8 Units Subcutaneous QHS  . megestrol  200 mg Oral Daily  . multivitamin with minerals  1 tablet Oral Daily  . pantoprazole  40 mg Oral Daily  . sodium bicarbonate  1,300 mg Oral BID PC   Continuous Infusions: . sodium chloride 125 mL/hr at 07/27/20 6948    Assessment & Plan:   Principal Problem:   Generalized weakness Active Problems:   GERD (gastroesophageal reflux disease)   Hypotension   Tobacco abuse   Chronic diarrhea   CKD (chronic kidney disease), stage IV (HCC)   FTT (failure to thrive) in adult   Generalized weakness Multifactorial and secondary to poor oral intake as well as hypotension resulting in falls 10/17-continue IV fluids  Encourage p.o. intake  PT evaluated recommended home PT     Diabetes mellitus with complications of stage IV chronic kidney disease Blood glucose mildly elevated Continue R-ISS Carb control diet Baseline creatinine 1.90 today is 2.30 We will continue IV fluids    Dehydration-Secondary to poor oral  intake Encourage p.o. intake  We will continue IV hydration    A/CKD-stage iv-likely prerenal due to decreased p.o. intake and dehydration Improving with IV fluids We will continue with hydration monitor levels   Nicotine dependence Counseled on smoking cessation  Patient declined nicotine patch      Adult failure to thrive Patient has a BMI of 15.20 Continue feeding supplements Continue on Megace  Encouraged p.o. intake     Hyponatremia-on admission with sodium 128  Likely secondary to hypoglycemia as well as decreased p.o. intake  Improving with IV fluids  Sodium today 132  Continue to monitor levels     DVT prophylaxis: Lovenox Code Status: Full Family Communication: None at bedside  Status is: Inpatient  Remains inpatient appropriate because:IV treatments appropriate due to intensity of illness or inability to take PO   Dispo: The patient is from: Home              Anticipated d/c is to: [facility]              Anticipated d/c date is: 1 day-2 days              Patient currently is not medically stable to d/c.still with decrease po intake. Needs ivf for hydration            LOS: 1 day   Time spent: 35 min with >35% on coc   Nolberto Hanlon, MD [facility] Pager [phone]  If 7PM-7AM, please contact night-coverage [email or web address] Password [email or web address] 07/27/2020, 12:42 PM

## 2020-07-28 DIAGNOSIS — N319 Neuromuscular dysfunction of bladder, unspecified: Secondary | ICD-10-CM

## 2020-07-28 LAB — HEMOGLOBIN A1C
Hgb A1c MFr Bld: 8.5 % — ABNORMAL HIGH (ref 4.8–5.6)
Mean Plasma Glucose: 197 mg/dL

## 2020-07-28 LAB — BASIC METABOLIC PANEL
Anion gap: 7 (ref 5–15)
BUN: 24 mg/dL — ABNORMAL HIGH (ref 6–20)
CO2: 18 mmol/L — ABNORMAL LOW (ref 22–32)
Calcium: 8.5 mg/dL — ABNORMAL LOW (ref 8.9–10.3)
Chloride: 106 mmol/L (ref 98–111)
Creatinine, Ser: 2.28 mg/dL — ABNORMAL HIGH (ref 0.61–1.24)
GFR, Estimated: 33 mL/min — ABNORMAL LOW (ref >60)
Glucose, Bld: 152 mg/dL — ABNORMAL HIGH (ref 70–99)
Potassium: 3.9 mmol/L (ref 3.5–5.1)
Sodium: 131 mmol/L — ABNORMAL LOW (ref 135–145)

## 2020-07-28 LAB — URINALYSIS, COMPLETE (UACMP) WITH MICROSCOPIC
Bilirubin Urine: NEGATIVE
Glucose, UA: NEGATIVE mg/dL
Ketones, ur: NEGATIVE mg/dL
Nitrite: NEGATIVE
Protein, ur: NEGATIVE mg/dL
Specific Gravity, Urine: 1.006 (ref 1.005–1.030)
Squamous Epithelial / HPF: NONE SEEN (ref 0–5)
pH: 6 (ref 5.0–8.0)

## 2020-07-28 LAB — GLUCOSE, CAPILLARY
Glucose-Capillary: 297 mg/dL — ABNORMAL HIGH (ref 70–99)
Glucose-Capillary: 97 mg/dL (ref 70–99)
Glucose-Capillary: 148 mg/dL — ABNORMAL HIGH (ref 70–99)
Glucose-Capillary: 245 mg/dL — ABNORMAL HIGH (ref 70–99)
Glucose-Capillary: 209 mg/dL — ABNORMAL HIGH (ref 70–99)

## 2020-07-28 MED ORDER — SODIUM CHLORIDE 0.9 % IV SOLN
1.0000 g | INTRAVENOUS | Status: DC
  Administered 2020-07-28 – 2020-07-29 (×2): 1 g via INTRAVENOUS
  Filled 2020-07-28: qty 1
  Filled 2020-07-28: qty 10

## 2020-07-28 NOTE — TOC Initial Note (Signed)
Transition of Care [facility]) - Initial/Assessment Note    Patient Details  Name: Gregory Moody MRN: [id number] Date of Birth: [birth year]  Transition of Care [facility] CM/SW Contact:    Shelbie Hutching, RN Phone Number: 07/28/2020, 10:14 AM  Clinical Narrative:                 Patient admitted to the hospital with generalized weakness, dehydration, and AKI.  Patient has had 9 inpatient admissions in the past 6 months related to his uncontrolled diabetes.  RNCM met with patient at the bedside with the Spanish Interpreter and explained role in discharge planning.  Patient is alert and engaged.  Patient reports that he lives with his parents and his son in a mobile home in [place].  Patient reports that he can do most ADL's and walks with his cane.  Patient reports that he has been falling a lot at home and does not know why.  Patient has a walker at home and [facility] encouraged use of walker over cane.  Patient has been offered home health services in the past but once discharged he never answered the phone when they tried to establish care. Patient offered home health services again and he would like for home health PT to be arranged, RNCM will work on charity home health.   Patient's diabetes is poorly controlled, Patient reports that he has lost his glucometer and has not been checking his blood sugars and therefore has not been taking his medication.  Patient also reports that he does not hiave any of his medications at home.  Patient reports that he is current with [facility] and he gets his prescriptions there.  Patient does not drive but says that his mother can take him to appointments.    TOC team will proved patient with glucometer to take home, ensure that he has prescriptions ordered, and attempt to set up home health PT.     Expected Discharge Plan: [place] Barriers to Discharge: Continued Medical Work up   Patient Goals and CMS Choice Patient states  their goals for this hospitalization and ongoing recovery are:: Patient states "I want to be healthy, wishes he could be healthy" CMS Medicare.gov Compare Post Acute Care list provided to:: Patient Choice offered to / list presented to : Patient  Expected Discharge Plan and Services Expected Discharge Plan: [place]   Discharge Planning Services: CM Consult Post Acute Care Choice: [place] arrangements for the past 2 months: Mobile Home                                      Prior Living Arrangements/Services Living arrangements for the past 2 months: Mobile Home Lives with:: Parents Patient language and need for interpreter reviewed:: Yes (requires Spanish Interpreter) Do you feel safe going back to the place where you live?: Yes      Need for Family Participation in Patient Care: Yes (Comment) (uncontrolled diabetes) Care giver support system in place?: Yes (comment) (parents) Current home services: DME (cane, walker) Criminal Activity/Legal Involvement Pertinent to Current Situation/Hospitalization: No - Comment as needed  Activities of Daily Living Home Assistive Devices/Equipment: Cane (specify quad or straight) (quad) ADL Screening (condition at time of admission) Patient's cognitive ability adequate to safely complete daily activities?: Yes Is the patient deaf or have difficulty hearing?: No Does the patient have difficulty seeing,  even when wearing glasses/contacts?: No Does the patient have difficulty concentrating, remembering, or making decisions?: No Patient able to express need for assistance with ADLs?: Yes Does the patient have difficulty dressing or bathing?: No Independently performs ADLs?: No Communication: Independent Dressing (OT): Independent Grooming: Independent Feeding: Independent Bathing: Independent Toileting: Independent In/Out Bed: Needs assistance Walks in Home: Needs assistance Does the patient have difficulty  walking or climbing stairs?: Yes Weakness of Legs: Both Weakness of Arms/Hands: Both  Permission Sought/Granted Permission sought to share information with : Case Manager                Emotional Assessment Appearance:: Appears older than stated age Attitude/Demeanor/Rapport: Engaged Affect (typically observed): Accepting Orientation: : Oriented to Self, Oriented to Place, Oriented to  Time, Oriented to Situation Alcohol / Substance Use: Not Applicable Psych Involvement: No (comment)  Admission diagnosis:  Dehydration [E86.0] Weakness [R53.1] Generalized weakness [R53.1] AKI (acute kidney injury) ([place]) [N17.9] Patient Active Problem List   Diagnosis Date Noted  . Sepsis secondary to UTI ([place]) 06/12/2020  . E-coli UTI 06/12/2020  . UTI due to Klebsiella species 06/12/2020  . Hypothermia   . Hyponatremia   . Encephalopathy 06/09/2020  . Neurogenic bladder   . Penile abscess 05/06/2020  . FTT (failure to thrive) in adult 05/03/2020  . Weakness 05/02/2020  . CKD (chronic kidney disease), stage IV ([place]) 05/02/2020  . Type 1 diabetes mellitus with diabetic chronic kidney disease ([place])   . Goals of care, counseling/discussion   . Palliative care by specialist   . Recurrent major depression-severe ([place]) 03/31/2020  . Hyperkalemia   . Iron deficiency anemia   . Weight loss   . Chronic diarrhea 02/07/2020  . Abnormal CT of the chest   . Acute urinary retention 02/06/2020  . Hyperglycemia due to type 2 diabetes mellitus ([place]) 02/06/2020  . History of Clostridium difficile colitis 02/06/2020  . Generalized weakness 02/06/2020  . History of COVID-19 02/06/2020  . Diarrhea 01/21/2020  . Tobacco abuse 01/21/2020  . C. difficile colitis 01/21/2020  . Urinary retention 01/21/2020  . Diabetes mellitus without complication ([place]) 29/93/7169  . Protein-calorie malnutrition, severe 01/02/2020  . Odynophagia   . DKA (diabetic ketoacidoses) 12/27/2019  . Depression 12/27/2019  .  DKA, type 2 ([place]) 12/27/2019  . Anemia of chronic disease 04/25/2019  . Hypomagnesemia 04/25/2019  . Hypotension 04/23/2019  . CAP (community acquired pneumonia) due to MSSA (methicillin sensitive Staphylococcus aureus) ([place]) 04/14/2019  . COVID-19 virus infection 04/14/2019  . Pressure injury of skin 03/31/2019  . Acute renal failure (ARF) ([place]) 03/06/2019  . CAP (community acquired pneumonia) 02/14/2019  . AKI (acute kidney injury) ([place]) 02/02/2019  . ARF (acute renal failure) ([place]) 01/03/2019  . Malnutrition of moderate degree 02/24/2018  . GERD (gastroesophageal reflux disease) 02/23/2018  . Diabetic gastroparesis ([place]) 02/23/2018  . Diabetic foot ulcer ([place]) 02/23/2018  . Aspiration pneumonia ([place]) 04/21/2017  . Hypoglycemia 04/21/2017  . Diabetes mellitus with hyperglycemia ([place]) 04/21/2017  . Hip fracture, unspecified laterality, closed, initial encounter ([place]) 04/17/2017  . Hyperglycemia 04/17/2017  . Malnutrition ([place]) 04/09/2016  . Cavitary lesion of lung 04/06/2016   PCP:  [facility]:   [place], [place] - [address] [place] [address] Phone: [phone] Fax: [phone]  [facility] [address] ([address]), [place] - [address] [place]) [place] [address] Phone: [phone] Fax: [phone]  [facility] -  [place], [place] - [address] [place] [address] Phone: [phone] Fax: [phone]     Social Determinants of Health (SDOH) Interventions    Readmission Risk Interventions Readmission Risk Prevention Plan 07/28/2020 05/09/2020 04/09/2020  Transportation Screening Complete Complete Complete  PCP or Specialist Appt within 3-5 Days - - -  Social Work Consult for [place] - - -  Medication Review Press photographer)  Complete Complete Complete  PCP or Specialist appointment within 3-5 days of discharge Complete - Patient refused  [place] or Home Care Consult Complete - Not Complete  SW Recovery Care/Counseling Consult Complete - -  Palliative Care Screening Not Applicable - Complete  Skilled Nursing Facility Not Applicable Not Applicable Not Applicable  Some recent data might be hidden

## 2020-07-28 NOTE — Progress Notes (Signed)
Initial Nutrition Assessment  DOCUMENTATION CODES:   Severe malnutrition in context of chronic illness, Underweight  INTERVENTION:  Continue Glucerna Shake po TID, each supplement provides 220 kcal and 10 grams of protein.  Continue MVI po daily.  Monitor magnesium, potassium, and phosphorus daily for at least 3 days, MD to replete as needed, as pt is at risk for refeeding syndrome given severe malnutrition.  NUTRITION DIAGNOSIS:   Severe Malnutrition related to chronic illness (uncontrolled DM, CKD, previously had food insecurity and difficulty obtaining food at home per his report) as evidenced by severe fat depletion, severe muscle depletion.  GOAL:   Patient will meet greater than or equal to 90% of their needs  MONITOR:   PO intake, Supplement acceptance, Labs, Weight trends, I & O's  REASON FOR ASSESSMENT:   Malnutrition Screening Tool    ASSESSMENT:   44 year old male with PMHx of DM, gastroparesis, TB, GERD, anemia, tobacco abuse, esophageal candidiasis, COVID-19 infection 04/23/2019, recent C difficile colitis admitted with generalized weakness, purulent drainage around suprapubic catheter, dehydration, AKI on CKD stage IV, chronic diarrhea.    Met with patient at bedside with assistance from [place] interpreter Jonn Shingles). Patient reports he has a good appetite and is eating well at meals. He reports he is eating 100% of his meals. He reports his mother now prepares food for him at home and he has been eating better at home (previously struggled with food insecurity). He typically eats 3 meals per day now. He may eat eggs, beans, meat, and other foods prepared by mother. He is unable to provide a very thorough history on usual intake so unsure exactly how much he is eating at meals. He reports he does have oral nutrition supplements at home but was having difficulty finding ones that are suitable for lactose-intolerance. Patient reports he has chronic diarrhea. He is  tolerating Glucerna here and would like to keep drinking these. In the past 24 hours patient has had approximately 2254 kcal (>100% estimated needs) and 101 grams of protein (>100% estimated needs).  UBW had been 140-150 lbs prior to initial weight loss. Patient reports lately he has been weight-stable at 110 lbs (50 kg). Weights in chart appear to fluctuate significantly lately (44-60 kg) and unsure of their accuracy. Patient is currently documented to be 44 kg (97 lbs).  Medications reviewed and include: ferrous sulfate 325 mg BID, Novolog 0-9 units TID, Lantus 8 units QHS, Megace 200 mg daily, MVI daily, Protonix, sodium bicarbonate 1300 mg BID, ceftriaxone.  Labs reviewed: CBG 97-201, Sodium 131, CO2 18, BUN 24, Creatinine 2.28.  Discussed with RN.  NUTRITION - FOCUSED PHYSICAL EXAM:    Most Recent Value  Orbital Region Severe depletion  Upper Arm Region Severe depletion  Thoracic and Lumbar Region Severe depletion  Buccal Region Severe depletion  Temple Region Severe depletion  Clavicle Bone Region Severe depletion  Clavicle and Acromion Bone Region Severe depletion  Scapular Bone Region Severe depletion  Dorsal Hand Severe depletion  Patellar Region Severe depletion  Anterior Thigh Region Severe depletion  Posterior Calf Region Severe depletion  Edema (RD Assessment) None  Hair Reviewed  Eyes Reviewed  Mouth Reviewed  Skin Reviewed  Nails Reviewed     Diet Order:   Diet Order            Diet Carb Modified Fluid consistency: Thin; Room service appropriate? Yes  Diet effective now  EDUCATION NEEDS:   No education needs have been identified at this time  Skin:  Skin Assessment: Reviewed RN Assessment  Last BM:  Unknown  Height:   Ht Readings from Last 1 Encounters:  07/26/20 _0  (1.727 m)   Weight:   Wt Readings from Last 1 Encounters:  07/26/20 44 kg   Ideal Body Weight:  70 kg  BMI:  Body mass index is 14.75 kg/m.  Estimated  Nutritional Needs:   Kcal:  1700-1900  Protein:  85-95 grams  Fluid:  1.5-1.7 L/day  Jacklynn Barnacle, MS, RD, LDN Pager number available on Amion

## 2020-07-28 NOTE — Progress Notes (Addendum)
PROGRESS NOTE    Gregory Moody  PXT:[id number] DOB: Jun 12, 1976 DOA: 07/26/2020 PCP: [facility]    Brief Narrative:  Gregory Moody is a 44 y.o. male with medical history significant fordiabetes mellitus, GERD, gastroparesis, DKA, anemia, tobacco abuse, esophageal candidiasis, COVID-19 infection 04/23/2019, recent C. difficile colitis,who presents to the ER for evaluation of generalized weakness and a fall ?? Syncopal episode. Patient states that he has had very poor oral intake and does not have an appetite.  He denies having any abdominal pain but has chronic diarrhea.  He denies having any nausea or vomiting.  Patient states that he had multiple falls 1 day prior to his admission and thinks he may have passed out.Upon arrival to the ER he was noted to have a blood pressure 61/40 and received IV fluid resuscitation.  Patient received 2 L of IV fluid with improvement in his blood pressure.  10/18- still with chronic diarrhea, not much po intake per pt. Doesn't know about "immodium"  Consultants:     Procedures:   Antimicrobials:       Subjective: Poor appetite though.  Still would diarrhea no nausea vomiting.  Per nursing he has pus around suprapubic catheter  Objective: Vitals:   07/27/20 2350 07/28/20 0438 07/28/20 0759 07/28/20 1158  BP: (!) 84/58 102/72 101/79 (!) 148/105  Pulse: 78 77 70 80  Resp: 16 18 17 16   Temp: 99 F (37.2 C) 99.4 F (37.4 C) 98.2 F (36.8 C) 98.8 F (37.1 C)  TempSrc: Oral Oral Oral Oral  SpO2: 100% 100% 100% 100%  Weight:      Height:        Intake/Output Summary (Last 24 hours) at 07/28/2020 1443 Last data filed at 07/28/2020 1300 Gross per 24 hour  Intake 2486.23 ml  Output --  Net 2486.23 ml   Filed Weights   07/26/20 1159 07/26/20 2132  Weight: 45.4 kg 44 kg    Examination: Calm, comfortable CTA, no wheeze rales rhonchi's RRR S1-S2 no murmurs rubs Soft benign nontender positive bowel  sounds Alert and oriented.  Grossly intact No edema Mood and affect appropriate in current setting   Data Reviewed: I have personally reviewed following labs and imaging studies  CBC: Recent Labs  Lab 07/26/20 1219 07/27/20 0534  WBC 5.0 4.3  NEUTROABS 3.0  --   HGB 8.3* 8.0*  HCT 24.2* 22.5*  MCV 90.6 87.9  PLT 272 627   Basic Metabolic Panel: Recent Labs  Lab 07/26/20 1219 07/27/20 0534 07/28/20 0301  NA 128* 132* 131*  K 3.8 4.0 3.9  CL 103 107 106  CO2 18* 18* 18*  GLUCOSE 328* 263* 152*  BUN 28* 25* 24*  CREATININE 2.68* 2.30* 2.28*  CALCIUM 8.6* 8.3* 8.5*  MG 2.1  --   --    GFR: Estimated Creatinine Clearance: 25.5 mL/min (A) (by C-G formula based on SCr of 2.28 mg/dL (H)). Liver Function Tests: Recent Labs  Lab 07/26/20 1219  AST 17  ALT 33  ALKPHOS 73  BILITOT 0.5  PROT 6.3*  ALBUMIN 3.1*   No results for input(s): LIPASE, AMYLASE in the last 168 hours. No results for input(s): AMMONIA in the last 168 hours. Coagulation Profile: No results for input(s): INR, PROTIME in the last 168 hours. Cardiac Enzymes: No results for input(s): CKTOTAL, CKMB, CKMBINDEX, TROPONINI in the last 168 hours. BNP (last 3 results) No results for input(s): PROBNP in the last 8760 hours. HbA1C: Recent Labs    07/26/20 1219  HGBA1C 8.5*   CBG: Recent Labs  Lab 07/27/20 1132 07/27/20 1632 07/27/20 1939 07/28/20 0757 07/28/20 1201  GLUCAP 281* 132* 201* 97 148*   Lipid Profile: No results for input(s): CHOL, HDL, LDLCALC, TRIG, CHOLHDL, LDLDIRECT in the last 72 hours. Thyroid Function Tests: No results for input(s): TSH, T4TOTAL, FREET4, T3FREE, THYROIDAB in the last 72 hours. Anemia Panel: No results for input(s): VITAMINB12, FOLATE, FERRITIN, TIBC, IRON, RETICCTPCT in the last 72 hours. Sepsis Labs: Recent Labs  Lab 07/26/20 1219  LATICACIDVEN 1.6    Recent Results (from the past 240 hour(s))  Respiratory Panel by RT PCR (Flu A&B, Covid) -  Nasopharyngeal Swab     Status: None   Collection Time: 07/26/20  1:23 PM   Specimen: Nasopharyngeal Swab  Result Value Ref Range Status   SARS Coronavirus 2 by RT PCR NEGATIVE NEGATIVE Final    Comment: (NOTE) SARS-CoV-2 target nucleic acids are NOT DETECTED.  The SARS-CoV-2 RNA is generally detectable in upper respiratoy specimens during the acute phase of infection. The lowest concentration of SARS-CoV-2 viral copies this assay can detect is 131 copies/mL. A negative result does not preclude SARS-Cov-2 infection and should not be used as the sole basis for treatment or other patient management decisions. A negative result may occur with  improper specimen collection/handling, submission of specimen other than nasopharyngeal swab, presence of viral mutation(s) within the areas targeted by this assay, and inadequate number of viral copies (<131 copies/mL). A negative result must be combined with clinical observations, patient history, and epidemiological information. The expected result is Negative.  Fact Sheet for Patients:  PinkCheek.be  Fact Sheet for Healthcare Providers:  GravelBags.it  This test is no t yet approved or cleared by the [place] FDA and  has been authorized for detection and/or diagnosis of SARS-CoV-2 by FDA under an Emergency Use Authorization (EUA). This EUA will remain  in effect (meaning this test can be used) for the duration of the COVID-19 declaration under Section 564(b)(1) of the Act, 21 U.S.C. section 360bbb-3(b)(1), unless the authorization is terminated or revoked sooner.     Influenza A by PCR NEGATIVE NEGATIVE Final   Influenza B by PCR NEGATIVE NEGATIVE Final    Comment: (NOTE) The Xpert Xpress SARS-CoV-2/FLU/RSV assay is intended as an aid in  the diagnosis of influenza from Nasopharyngeal swab specimens and  should not be used as a sole basis for treatment. Nasal washings and    aspirates are unacceptable for Xpert Xpress SARS-CoV-2/FLU/RSV  testing.  Fact Sheet for Patients: PinkCheek.be  Fact Sheet for Healthcare Providers: GravelBags.it  This test is not yet approved or cleared by the [place] FDA and  has been authorized for detection and/or diagnosis of SARS-CoV-2 by  FDA under an Emergency Use Authorization (EUA). This EUA will remain  in effect (meaning this test can be used) for the duration of the  Covid-19 declaration under Section 564(b)(1) of the Act, 21  U.S.C. section 360bbb-3(b)(1), unless the authorization is  terminated or revoked. Performed at [facility], [address] [place], [place] [address]          Radiology Studies: No results found.      Scheduled Meds: . aspirin EC  81 mg Oral Daily  . benztropine  0.5 mg Oral BID  . DULoxetine  30 mg Oral Daily  . enoxaparin (LOVENOX) injection  30 mg Subcutaneous Q24H  . feeding supplement (GLUCERNA SHAKE)  237 mL Oral TID BM  . ferrous sulfate  325 mg Oral BID PC  . haloperidol  0.5 mg Oral BID  . influenza vac split quadrivalent PF  0.5 mL Intramuscular Tomorrow-1000  . insulin aspart  0-9 Units Subcutaneous TID WC  . insulin glargine  8 Units Subcutaneous QHS  . megestrol  200 mg Oral Daily  . multivitamin with minerals  1 tablet Oral Daily  . pantoprazole  40 mg Oral Daily  . sodium bicarbonate  1,300 mg Oral BID PC   Continuous Infusions: . sodium chloride 125 mL/hr at 07/27/20 0649  . cefTRIAXone (ROCEPHIN)  IV 1 g (07/28/20 1005)    Assessment & Plan:   Principal Problem:   Generalized weakness Active Problems:   GERD (gastroesophageal reflux disease)   Hypotension   Tobacco abuse   Chronic diarrhea   CKD (chronic kidney disease), stage IV (HCC)   FTT (failure to thrive) in adult   Generalized weakness Multifactorial and secondary to poor oral intake as well as hypotension  resulting in falls 10/18-last night hypotensive again,  still decreased po intake, will continue ivf. PT-rec. HHPT Protein shakes   Diabetes mellitus with complications of stage IV chronic kidney disease BG stable  Continue R ISS  Add close to baseline creatinine she improving with IV fluids  EnCourage p.o. intake  Purulent drainage around suprapubic cather- urology consulted.  Input was appreciated Per urology site does not appear infected on visual exam.  Drainage appears normal given recent SP tube placement.  Nothing further to do unless he develops fever, chills, nausea or vomiting, or lower abdominal pain or back pain system with UTI. Recommended ongoing wound care and keeping the exposed part of the catheter clean with application of warm soapy water once daily  Dehydration-Secondary to poor oral intake BP on low sides at times, can you wean off of IV fluids  Encourage p.o. intake  Continue with IV fluids     A/CKD-stage iv-likely prerenal due to decreased p.o. intake and dehydration Improved with IV fluids, close to baseline  Continue with IV fluids patient has chronic diarrhea and decreased p.o. intake   Nicotine dependence Patient declined nicotine patch  Was counseled on smoking cessation    Adult failure to thrive Patient has a BMI of 15.20 Continue feeding supplements Continue on Megace  Encouraged p.o. intake     Hyponatremia-on admission with sodium 128  Likely secondary to hypoglycemia as well as decreased p.o. intake  Improving, today 131 Continue to monitor Sodium today 132  Continue to monitor levels   Chronic diarrhea- on going. On loperamide prn  DVT prophylaxis: Lovenox Code Status: Full Family Communication: None at bedside  Status is: Inpatient  Remains inpatient appropriate because:IV treatments appropriate due to intensity of illness or inability to take PO   Dispo: The patient is from: Home              Anticipated d/c is  to: [facility]              Anticipated d/c date is: 2 dats              Patient currently is not medically stable to d/c.still with decrease po intake. Needs ivf for hydration            LOS: 2 days   Time spent: 35 min with >35% on coc   Nolberto Hanlon, MD [facility] Pager [phone]  If 7PM-7AM, please contact night-coverage [email or web address] Password [facility] 07/28/2020, 2:43 PM

## 2020-07-28 NOTE — Progress Notes (Signed)
VAST consult received to obtain IV access. Pt currently in bathroom. VAST RN will return at a later time.

## 2020-07-28 NOTE — Consult Note (Signed)
Urology Consult  I have been asked to see the patient by Dr. Kurtis Bushman, for evaluation and management of purulent discharge around SPT site.  Chief Complaint: purulent discharge around SPT  History of Present Illness: Gregory Moody is a 44 y.o. year old male with a history of uncontrolled diabetes, gastroparesis, and likely neurogenic bladder managed with suprapubic catheter admitted on 07/26/2020 for evaluation of dizziness, chills, falls, and poor p.o. intake.  He was subsequently found to have purulent drainage around his suprapubic catheter.  I exchanged his SP tube most recently in clinic on 07/15/2020.  SP tube was placed initially on 05/06/2020 to bypass the urethra in the setting of penile abscess, now healed.  UA today with 6-10 WBCs/hpf and few bacteria.  Creatinine stable at 2.28.  WBC count stable at 4.3.  Patient denies lower abdominal pain, low back pain, fever, chills, nausea, or vomiting.  He states the drainage is stable.    Past Medical History:  Diagnosis Date  . COVID-19   . Diabetes mellitus without complication ([place])   . Gastroparesis   . Tuberculosis     Past Surgical History:  Procedure Laterality Date  . COLONOSCOPY N/A 01/25/2020   Procedure: COLONOSCOPY;  Surgeon: Toledo, Benay Pike, MD;  Location: [facility] ENDOSCOPY;  Service: Gastroenterology;  Laterality: N/A;  . ESOPHAGOGASTRODUODENOSCOPY N/A 02/03/2019   Procedure: ESOPHAGOGASTRODUODENOSCOPY (EGD);  Surgeon: Toledo, Benay Pike, MD;  Location: [facility] ENDOSCOPY;  Service: Gastroenterology;  Laterality: N/A;  . IR CATHETER TUBE CHANGE  06/13/2020    Home Medications:  Current Meds  Medication Sig  . aspirin EC 81 MG tablet Take 81 mg by mouth daily.  . benztropine (COGENTIN) 0.5 MG tablet Take 1 tablet (0.5 mg total) by mouth 2 (two) times daily.  . DULoxetine (CYMBALTA) 30 MG capsule Take 1 capsule (30 mg total) by mouth daily.  . ferrous sulfate 325 (65 FE) MG EC tablet Take 1 tablet (325 mg total) by  mouth 2 (two) times daily.  . haloperidol (HALDOL) 0.5 MG tablet Take 1 tablet (0.5 mg total) by mouth 2 (two) times daily.  . insulin glargine (LANTUS) 100 UNIT/ML injection Inject 0.08 mLs (8 Units total) into the skin daily.  [name] loperamide (IMODIUM) 2 MG capsule Take 1 capsule (2 mg total) by mouth 2 (two) times daily as needed for diarrhea or loose stools.  . Multiple Vitamin (MULTIVITAMIN WITH MINERALS) TABS tablet Take 1 tablet by mouth daily.  [name] NEXIUM 40 MG capsule Take 40 mg by mouth daily.  . sodium bicarbonate 650 MG tablet Take 2 tablets (1,300 mg total) by mouth 2 (two) times daily.  . sodium polystyrene (KAYEXALATE) 15 GM/60ML suspension Take 60 mLs (15 g total) by mouth every other day.    Allergies: No Known Allergies  Family History  Problem Relation Age of Onset  . Diabetes Mother   . Diabetes Father     Social History:  reports that he has quit smoking. His smoking use included cigars and cigarettes. He has never used smokeless tobacco. He reports that he does not drink alcohol and does not use drugs.  ROS: A complete review of systems was performed.  All systems are negative except for pertinent findings as noted.  Physical Exam:  Vital signs in last 24 hours: Temp:  [98.2 F (36.8 C)-99.4 F (37.4 C)] 98.2 F (36.8 C) (10/18 0759) Pulse Rate:  [70-92] 70 (10/18 0759) Resp:  [16-20] 17 (10/18 0759) BP: (84-144)/(58-97) 101/79 (10/18 0759) SpO2:  [100 %]  100 % (10/18 0759) Constitutional:  Alert and oriented, no acute distress, cachectic HEENT: [place] AT, moist mucus membranes Cardiovascular: No clubbing, cyanosis, or edema. Respiratory: Normal respiratory effort GU: Small volume of crusting, purulent appearing drainage around the suprapubic site.  No erythema, fluctuance, or induration.  Site nontender to deep palpation. Neurologic: Grossly intact, no focal deficits, moving all 4 extremities Psychiatric: Normal mood and affect  Laboratory Data:  Recent Labs     07/26/20 1219 07/27/20 0534  WBC 5.0 4.3  HGB 8.3* 8.0*  HCT 24.2* 22.5*   Recent Labs    07/26/20 1219 07/27/20 0534 07/28/20 0301  NA 128* 132* 131*  K 3.8 4.0 3.9  CL 103 107 106  CO2 18* 18* 18*  GLUCOSE 328* 263* 152*  BUN 28* 25* 24*  CREATININE 2.68* 2.30* 2.28*  CALCIUM 8.6* 8.3* 8.5*   Urinalysis    Component Value Date/Time   COLORURINE YELLOW (A) 07/28/2020 0849   APPEARANCEUR HAZY (A) 07/28/2020 0849   LABSPEC 1.006 07/28/2020 0849   PHURINE 6.0 07/28/2020 0849   GLUCOSEU NEGATIVE 07/28/2020 0849   HGBUR SMALL (A) 07/28/2020 0849   [place] 07/28/2020 [place] 07/28/2020 0849   PROTEINUR NEGATIVE 07/28/2020 0849   NITRITE NEGATIVE 07/28/2020 0849   LEUKOCYTESUR MODERATE (A) 07/28/2020 0849   Results for orders placed or performed during the hospital encounter of 07/26/20  Respiratory Panel by RT PCR (Flu A&B, Covid) - Nasopharyngeal Swab     Status: None   Collection Time: 07/26/20  1:23 PM   Specimen: Nasopharyngeal Swab  Result Value Ref Range Status   SARS Coronavirus 2 by RT PCR NEGATIVE NEGATIVE Final    Comment: (NOTE) SARS-CoV-2 target nucleic acids are NOT DETECTED.  The SARS-CoV-2 RNA is generally detectable in upper respiratoy specimens during the acute phase of infection. The lowest concentration of SARS-CoV-2 viral copies this assay can detect is 131 copies/mL. A negative result does not preclude SARS-Cov-2 infection and should not be used as the sole basis for treatment or other patient management decisions. A negative result may occur with  improper specimen collection/handling, submission of specimen other than nasopharyngeal swab, presence of viral mutation(s) within the areas targeted by this assay, and inadequate number of viral copies (<131 copies/mL). A negative result must be combined with clinical observations, patient history, and epidemiological information. The expected result is  Negative.  Fact Sheet for Patients:  PinkCheek.be  Fact Sheet for Healthcare Providers:  GravelBags.it  This test is no t yet approved or cleared by the [place] FDA and  has been authorized for detection and/or diagnosis of SARS-CoV-2 by FDA under an Emergency Use Authorization (EUA). This EUA will remain  in effect (meaning this test can be used) for the duration of the COVID-19 declaration under Section 564(b)(1) of the Act, 21 U.S.C. section 360bbb-3(b)(1), unless the authorization is terminated or revoked sooner.     Influenza A by PCR NEGATIVE NEGATIVE Final   Influenza B by PCR NEGATIVE NEGATIVE Final    Comment: (NOTE) The Xpert Xpress SARS-CoV-2/FLU/RSV assay is intended as an aid in  the diagnosis of influenza from Nasopharyngeal swab specimens and  should not be used as a sole basis for treatment. Nasal washings and  aspirates are unacceptable for Xpert Xpress SARS-CoV-2/FLU/RSV  testing.  Fact Sheet for Patients: PinkCheek.be  Fact Sheet for Healthcare Providers: GravelBags.it  This test is not yet approved or cleared by the [place] and  has been authorized  for detection and/or diagnosis of SARS-CoV-2 by  FDA under an Emergency Use Authorization (EUA). This EUA will remain  in effect (meaning this test can be used) for the duration of the  Covid-19 declaration under Section 564(b)(1) of the Act, 21  U.S.C. section 360bbb-3(b)(1), unless the authorization is  terminated or revoked. Performed at [facility], [address] [place], [place] [address]    Assessment & Plan:  44 year old male with SP tube for management of likely neurogenic bladder in the setting of uncontrolled diabetes admitted for generalized weakness and falls and subsequently reporting purulent drainage around the suprapubic site.  UA today relatively  benign especially given chronic suprapubic catheter in place.  He denies systemic signs of illness today and site does not appear infected on visual exam.  Drainage appears normal given recent SP tube placement.  Nothing further to do unless he develops fever, chills, nausea, vomiting, lower abdominal pain, or low back pain consistent with urinary tract infection.  Recommend ongoing wound care and keeping the exposed part of the catheter clean with application of warm, soapy water once daily.  Thank you for involving me in this patient's care, please page with any further questions or concerns.  Debroah Loop, PA-C 07/28/2020 9:13 AM

## 2020-07-29 DIAGNOSIS — Z7189 Other specified counseling: Secondary | ICD-10-CM

## 2020-07-29 DIAGNOSIS — Z515 Encounter for palliative care: Secondary | ICD-10-CM

## 2020-07-29 LAB — GLUCOSE, CAPILLARY
Glucose-Capillary: 178 mg/dL — ABNORMAL HIGH (ref 70–99)
Glucose-Capillary: 166 mg/dL — ABNORMAL HIGH (ref 70–99)
Glucose-Capillary: 302 mg/dL — ABNORMAL HIGH (ref 70–99)
Glucose-Capillary: 291 mg/dL — ABNORMAL HIGH (ref 70–99)
Glucose-Capillary: 188 mg/dL — ABNORMAL HIGH (ref 70–99)

## 2020-07-29 MED ORDER — RISAQUAD PO CAPS
2.0000 | ORAL_CAPSULE | Freq: Three times a day (TID) | ORAL | Status: DC
  Administered 2020-07-29 – 2020-07-31 (×7): 2 via ORAL
  Filled 2020-07-29 (×7): qty 2

## 2020-07-29 MED ORDER — BACID PO TABS
2.0000 | ORAL_TABLET | Freq: Three times a day (TID) | ORAL | Status: DC
  Filled 2020-07-29 (×2): qty 2

## 2020-07-29 NOTE — Progress Notes (Signed)
PROGRESS NOTE    Gregory Moody  XWR:[id number] DOB: December 07, 1975 DOA: 07/26/2020 PCP: [place]    Brief Narrative:  Gregory Moody is a 44 y.o. male with medical history significant fordiabetes mellitus, GERD, gastroparesis, DKA, anemia, tobacco abuse, esophageal candidiasis, COVID-19 infection 04/23/2019, recent C. difficile colitis,who presents to the ER for evaluation of generalized weakness and a fall ?? Syncopal episode. Patient states that he has had very poor oral intake and does not have an appetite.  He denies having any abdominal pain but has chronic diarrhea.  He denies having any nausea or vomiting.  Patient states that he had multiple falls 1 day prior to his admission and thinks he may have passed out.Upon arrival to the ER he was noted to have a blood pressure 61/40 and received IV fluid resuscitation.  Patient received 2 L of IV fluid with improvement in his blood pressure.  10/19- on ivf, bp taken infront of me 90/60. Appetite improving  Consultants:     Procedures:   Antimicrobials:    rocephin-10/18   Subjective: Appetite improving.  Continues with his diarrhea  Objective: Vitals:   07/29/20 0104 07/29/20 0441 07/29/20 0722 07/29/20 1124  BP: 127/84 106/81 115/75 110/78  Pulse: 83 73 76 83  Resp: 16 17 16 16   Temp: 98 F (36.7 C) 98.1 F (36.7 C) 98.3 F (36.8 C) 98.1 F (36.7 C)  TempSrc:   Oral Oral  SpO2: 100% 100% 100% 100%  Weight:      Height:        Intake/Output Summary (Last 24 hours) at 07/29/2020 1130 Last data filed at 07/29/2020 0915 Gross per 24 hour  Intake 2231.56 ml  Output 600 ml  Net 1631.56 ml   Filed Weights   07/26/20 1159 07/26/20 2132  Weight: 45.4 kg 44 kg    Examination: NAD, eating breakfast, comfortable CTA, no wheeze rales rhonchi's RRR S1-S2 no murmurs rubs gallops Abdomen soft nontender nondistended positive bowel sounds No edema Alert oriented x3, grossly intact Mood  and affect appropriate  Data Reviewed: I have personally reviewed following labs and imaging studies  CBC: Recent Labs  Lab 07/26/20 1219 07/27/20 0534  WBC 5.0 4.3  NEUTROABS 3.0  --   HGB 8.3* 8.0*  HCT 24.2* 22.5*  MCV 90.6 87.9  PLT 272 191   Basic Metabolic Panel: Recent Labs  Lab 07/26/20 1219 07/27/20 0534 07/28/20 0301  NA 128* 132* 131*  K 3.8 4.0 3.9  CL 103 107 106  CO2 18* 18* 18*  GLUCOSE 328* 263* 152*  BUN 28* 25* 24*  CREATININE 2.68* 2.30* 2.28*  CALCIUM 8.6* 8.3* 8.5*  MG 2.1  --   --    GFR: Estimated Creatinine Clearance: 25.5 mL/min (A) (by C-G formula based on SCr of 2.28 mg/dL (H)). Liver Function Tests: Recent Labs  Lab 07/26/20 1219  AST 17  ALT 33  ALKPHOS 73  BILITOT 0.5  PROT 6.3*  ALBUMIN 3.1*   No results for input(s): LIPASE, AMYLASE in the last 168 hours. No results for input(s): AMMONIA in the last 168 hours. Coagulation Profile: No results for input(s): INR, PROTIME in the last 168 hours. Cardiac Enzymes: No results for input(s): CKTOTAL, CKMB, CKMBINDEX, TROPONINI in the last 168 hours. BNP (last 3 results) No results for input(s): PROBNP in the last 8760 hours. HbA1C: Recent Labs    07/26/20 1219  HGBA1C 8.5*   CBG: Recent Labs  Lab 07/28/20 0757 07/28/20 1201 07/28/20 1644 07/28/20 2136  07/29/20 0812  GLUCAP 97 148* 245* 209* 178*   Lipid Profile: No results for input(s): CHOL, HDL, LDLCALC, TRIG, CHOLHDL, LDLDIRECT in the last 72 hours. Thyroid Function Tests: No results for input(s): TSH, T4TOTAL, FREET4, T3FREE, THYROIDAB in the last 72 hours. Anemia Panel: No results for input(s): VITAMINB12, FOLATE, FERRITIN, TIBC, IRON, RETICCTPCT in the last 72 hours. Sepsis Labs: Recent Labs  Lab 07/26/20 1219  LATICACIDVEN 1.6    Recent Results (from the past 240 hour(s))  Respiratory Panel by RT PCR (Flu A&B, Covid) - Nasopharyngeal Swab     Status: None   Collection Time: 07/26/20  1:23 PM   Specimen:  Nasopharyngeal Swab  Result Value Ref Range Status   SARS Coronavirus 2 by RT PCR NEGATIVE NEGATIVE Final    Comment: (NOTE) SARS-CoV-2 target nucleic acids are NOT DETECTED.  The SARS-CoV-2 RNA is generally detectable in upper respiratoy specimens during the acute phase of infection. The lowest concentration of SARS-CoV-2 viral copies this assay can detect is 131 copies/mL. A negative result does not preclude SARS-Cov-2 infection and should not be used as the sole basis for treatment or other patient management decisions. A negative result may occur with  improper specimen collection/handling, submission of specimen other than nasopharyngeal swab, presence of viral mutation(s) within the areas targeted by this assay, and inadequate number of viral copies (<131 copies/mL). A negative result must be combined with clinical observations, patient history, and epidemiological information. The expected result is Negative.  Fact Sheet for Patients:  PinkCheek.be  Fact Sheet for Healthcare Providers:  GravelBags.it  This test is no t yet approved or cleared by the [place] FDA and  has been authorized for detection and/or diagnosis of SARS-CoV-2 by FDA under an Emergency Use Authorization (EUA). This EUA will remain  in effect (meaning this test can be used) for the duration of the COVID-19 declaration under Section 564(b)(1) of the Act, 21 U.S.C. section 360bbb-3(b)(1), unless the authorization is terminated or revoked sooner.     Influenza A by PCR NEGATIVE NEGATIVE Final   Influenza B by PCR NEGATIVE NEGATIVE Final    Comment: (NOTE) The Xpert Xpress SARS-CoV-2/FLU/RSV assay is intended as an aid in  the diagnosis of influenza from Nasopharyngeal swab specimens and  should not be used as a sole basis for treatment. Nasal washings and  aspirates are unacceptable for Xpert Xpress SARS-CoV-2/FLU/RSV  testing.  Fact Sheet  for Patients: PinkCheek.be  Fact Sheet for Healthcare Providers: GravelBags.it  This test is not yet approved or cleared by the [place] FDA and  has been authorized for detection and/or diagnosis of SARS-CoV-2 by  FDA under an Emergency Use Authorization (EUA). This EUA will remain  in effect (meaning this test can be used) for the duration of the  Covid-19 declaration under Section 564(b)(1) of the Act, 21  U.S.C. section 360bbb-3(b)(1), unless the authorization is  terminated or revoked. Performed at [facility], [address], [place], [place] [address]   Urine Culture     Status: None (Preliminary result)   Collection Time: 07/28/20  8:49 AM   Specimen: Urine, Random  Result Value Ref Range Status   Specimen Description   Final    URINE, RANDOM Performed at [facility], [address] [place], [place] [address]    Special Requests   Final    NONE Performed at [facility], [address], [place],  [address]    Culture   Final  CULTURE REINCUBATED FOR BETTER GROWTH Performed at [facility], [place] [address], [place], [place] [address]    Report Status PENDING  Incomplete  CULTURE, BLOOD (ROUTINE X 2) w Reflex to ID Panel     Status: None (Preliminary result)   Collection Time: 07/28/20 10:31 AM   Specimen: BLOOD  Result Value Ref Range Status   Specimen Description BLOOD LEFT ANTECUBITAL  Final   Special Requests   Final    BOTTLES DRAWN AEROBIC AND ANAEROBIC Blood Culture results may not be optimal due to an excessive volume of blood received in culture bottles   Culture   Final    NO GROWTH < 24 HOURS Performed at [facility], [address], [place], [place] [address]    Report Status PENDING  Incomplete  CULTURE, BLOOD (ROUTINE X 2) w Reflex to ID Panel     Status: None (Preliminary result)   Collection Time: 07/28/20 10:32 AM   Specimen:  BLOOD  Result Value Ref Range Status   Specimen Description BLOOD LEFT ANTECUBITAL  Final   Special Requests   Final    BOTTLES DRAWN AEROBIC AND ANAEROBIC Blood Culture adequate volume   Culture   Final    NO GROWTH < 24 HOURS Performed at [facility], [address] [place], [place] [address]    Report Status PENDING  Incomplete         Radiology Studies: No results found.      Scheduled Meds: . aspirin EC  81 mg Oral Daily  . benztropine  0.5 mg Oral BID  . DULoxetine  30 mg Oral Daily  . enoxaparin (LOVENOX) injection  30 mg Subcutaneous Q24H  . feeding supplement (GLUCERNA SHAKE)  237 mL Oral TID BM  . ferrous sulfate  325 mg Oral BID PC  . haloperidol  0.5 mg Oral BID  . influenza vac split quadrivalent PF  0.5 mL Intramuscular Tomorrow-1000  . insulin aspart  0-9 Units Subcutaneous TID WC  . insulin glargine  8 Units Subcutaneous QHS  . megestrol  200 mg Oral Daily  . multivitamin with minerals  1 tablet Oral Daily  . pantoprazole  40 mg Oral Daily  . sodium bicarbonate  1,300 mg Oral BID PC   Continuous Infusions: . sodium chloride 100 mL/hr at 07/28/20 1823  . cefTRIAXone (ROCEPHIN)  IV 1 g (07/29/20 0911)    Assessment & Plan:   Principal Problem:   Generalized weakness Active Problems:   GERD (gastroesophageal reflux disease)   Hypotension   Tobacco abuse   Chronic diarrhea   CKD (chronic kidney disease), stage IV (HCC)   FTT (failure to thrive) in adult   Generalized weakness Multifactorial and secondary to poor oral intake as well as hypotension resulting in falls 10/19-still hypotensive.  Blood pressure was taken in for me which was 90/60.  Unable to wean him off of IV fluids.   We will continue IV hydration  His appetite is improving encouraged to increase p.o. 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  Input was appreciated Per urology site does not appear infected on visual exam.  Drainage appears normal given recent SP tube placement.  Nothing further to do unless he develops fever, chills, nausea or  vomiting, or lower abdominal pain or back pain system with UTI. Recommended ongoing wound care and keeping the exposed part of the catheter clean with application of warm soapy water once daily 10/19- seen UA better than previous UA and urology did not think infected, will d/c Rocephin which was started for ppx tx.   Dehydration-Secondary to poor oral intake BP on low sides at times, can you wean off of IV fluids  Encourage p.o. intake  Continue with IV fluids     A/CKD-stage iv-likely prerenal due to decreased p.o. intake and dehydration, chronic diarrhea Improving and close to baseline.  Creatinine 2.28  We will continue with IV hydration    Nicotine dependence Was counseled on smoking cessation  Declined nicotine patch  Adult failure to thrive/severe malnutrition related to chronic illness Patient has a BMI of 15.20 RD following  Continue with feeding supplements  Continue Megace  continue feeding supplements Encourage p.o. intake which he is doing better than other days    Hyponatremia-on admission with sodium 128  Likely secondary to hypoglycemia as well as decreased p.o. intake  Improving with IV hydration. Check a.m. labs    Chronic diarrhea- on going. On loperamide prn  DVT prophylaxis: Lovenox Code Status: Full Family Communication: None at bedside  Status is: Inpatient  Remains inpatient appropriate because:IV treatments appropriate due to intensity of illness or inability to take PO   Dispo: The patient is from: Home              Anticipated d/c is to: [facility]              Anticipated d/c date is: 2 dats              Patient  currently is not medically stable to d/c.still with decrease po intake. Needs ivf for hydration            LOS: 3 days   Time spent: 35 min with >35% on coc   Gregory Hanlon, MD [facility] Pager [phone]  If 7PM-7AM, please contact night-coverage [email or web address] Password [email or web address] 07/29/2020, 11:30 AM

## 2020-07-29 NOTE — Progress Notes (Addendum)
Physical Therapy Treatment Patient Details Name: Gregory Moody MRN: [id number] DOB: 04-07-1976 Today's Date: 07/29/2020    History of Present Illness Per MD note:Phil Heldman is a 44 y.o. male with medical history significant fordiabetes mellitus, GERD, gastroparesis, DKA, anemia, tobacco abuse, esophageal candidiasis, COVID-19 infection 04/23/2019, recent C. difficile colitis,who presents to the ER for evaluation of generalized weakness and a fall ?? Syncopal episode.    PT Comments    Patient seen using [employer] with language line, ID number [id number]. Blood pressure is monitored throughout session pre and post activity. Resting blood pressure 116/57mmG before mobilizing, and 109/75mmHg after walking. No dizziness is reported during or after activity. Patient is Mod I for bed mobility and transfers. Supervision provided with ambulation in hallway and in room with cues for technique. Patient has one bout of loss of balance towards left that was self corrected. Challenged dynamic standing balance during treatment with supervision provided for safety given unsteadiness with ambulation and risk for falls.  Recommend to continue PT to maximize independence and address remaining functional limitations. HHPT remains appropriate discharge recommendation at this time.     Follow Up Recommendations  Home health PT     Equipment Recommendations  None recommended by PT    Recommendations for Other Services       Precautions / Restrictions Precautions Precautions: Fall Restrictions Weight Bearing Restrictions: No    Mobility  Bed Mobility Overal bed mobility: Modified Independent                Transfers Overall transfer level: Modified independent Equipment used: None             General transfer comment: sit to and from standing from various surfaces including toilet and bed with Mod I with no loss of balance    Ambulation/Gait Ambulation/Gait assistance: Supervision Gait Distance (Feet): 360 Feet Assistive device: None Gait Pattern/deviations: Step-through pattern;Narrow base of support Gait velocity: decreased    General Gait Details: patient ambualted around nursing unit and in room with supervision. one loss of balance to left that was self corrected. supervision provided for safety throughout with occasional cues for safety and technique    Stairs             Wheelchair Mobility    Modified Rankin (Stroke Patients Only)       Balance Overall balance assessment: Needs assistance                             High Level Balance Comments: dynamic standing balance challenged with reaching outside base of support, bending forward, and during functional ambulation without UE support with supervision for safety. no gross loss of balance noted             Cognition Arousal/Alertness: Awake/alert Behavior During Therapy: WFL for tasks assessed/performed Overall Cognitive Status: Within Functional Limits for tasks assessed                                 General Comments: [employer] used via [employer], ID number [id number]      Exercises General Exercises - Lower Extremity Heel Slides: AAROM;Strengthening;Both;10 reps;Supine Hip ABduction/ADduction: AAROM;Strengthening;Both;10 reps;Supine Straight Leg Raises: AAROM;Strengthening;Both;10 reps;Supine Other Exercises Other Exercises: exercises performed in bed for strengthening of BLE for improved independence with functional mobility     General Comments  Pertinent Vitals/Pain Pain Assessment: No/denies pain    Home Living                      Prior Function            PT Goals (current goals can now be found in the care plan section) Acute Rehab PT Goals Patient Stated Goal: to go home tomorrow  PT Goal Formulation: With patient Time For  Goal Achievement: 08/10/20 Potential to Achieve Goals: Good Progress towards PT goals: Progressing toward goals    Frequency    Min 2X/week      PT Plan Current plan remains appropriate    Co-evaluation              AM-PAC PT "6 Clicks" Mobility   Outcome Measure  Help needed turning from your back to your side while in a flat bed without using bedrails?: None Help needed moving from lying on your back to sitting on the side of a flat bed without using bedrails?: None Help needed moving to and from a bed to a chair (including a wheelchair)?: None Help needed standing up from a chair using your arms (e.g., wheelchair or bedside chair)?: None Help needed to walk in hospital room?: None Help needed climbing 3-5 steps with a railing? : A Little 6 Click Score: 23    End of Session Equipment Utilized During Treatment: Gait belt Activity Tolerance: Patient tolerated treatment well Patient left: in bed;with call bell/phone within reach;with bed alarm set Nurse Communication: Mobility status PT Visit Diagnosis: Muscle weakness (generalized) (M62.81);History of falling (Z91.81)     Time: 1761-6073 PT Time Calculation (min) (ACUTE ONLY): 38 min  Charges:  $Gait Training: 8-22 mins $Therapeutic Exercise: 8-22 mins $Neuromuscular Re-education: 8-22 mins                     Minna Merritts, PT, MPT    Gregory Moody 07/29/2020, 2:03 PM

## 2020-07-29 NOTE — Consult Note (Signed)
Consultation Note Date: 07/29/2020   Patient Name: Gregory Moody  DOB: [birth year]  MRN: [id number]  Age / Sex: 44 y.o., male  PCP: [facility] Referring Physician: Nolberto Hanlon, MD  Reason for Consultation: Establishing goals of care  HPI/Patient Profile: Per MD note:Gregory Moody is a 44 y.o. male with medical history significant fordiabetes mellitus, GERD, gastroparesis, DKA, anemia, tobacco abuse, esophageal candidiasis, COVID-19 infection 04/23/2019, recent C. difficile colitis,who presents to the ER for evaluation of generalized weakness and a fall ?? Syncopal episode.  Clinical Assessment and Goals of Care: Patient is resting in bed. He states he lives with his mother, father, and son. He states he is no longer able to work, but both his parents do, and his son helps around the house.  He states he uses a cane at home. He tells me he has a very poor appetite, and when he does eat he has diarrhea; he further states the diarrhea "is all the time". [name] states his QOL is poor, and he tells me he is frequently in the hospital. He states he does not want to have to come to the hospital, but when he is weak and passes out, he has no choice.    We discussed his diagnoses, prognosis, and GOC. A detailed discussion was had today regarding advanced directives.  Concepts specific to code status, artifical feeding and hydration, IV antibiotics and rehospitalization were discussed.  The difference between an aggressive medical intervention path and a comfort care path was discussed.  Values and goals of care important to patient and family were attempted to be elicited.  Discussed limitations of medical interventions to prolong quality of life in some situations and discussed the concept of human mortality.  He states he wants to do what is needed to live as long as possible,  and there is no care that he would not want, or that is off limits. He is grateful for the glucometer and strips that were provided.   He is amenable to a PEG to help his nutrition and hydration. Per patient this would help his QOL because he could return to work. He states he would not need to return to the hospital as he would be nourished and hydrated, feel better, and have energy. He states without improvement of his symptoms, he feels he will have to return in days to perhaps a couple of weeks after discharge, which does not help his QOL.     SUMMARY OF RECOMMENDATIONS   Full code/full scope without limitation.  Recommend PEG placement to help his nutrition and hydration status.   Recommend palliative outpatient.   Prognosis:   Poor overall      Primary Diagnoses: Present on Admission: . Chronic diarrhea . CKD (chronic kidney disease), stage IV ([place]) . FTT (failure to thrive) in adult . GERD (gastroesophageal reflux disease) . Hypotension . Tobacco abuse   I have reviewed the medical record, interviewed the patient and family, and examined the patient. The following aspects are pertinent.  Past Medical History:  Diagnosis Date  . COVID-19   . Diabetes mellitus without complication ([place])   . Gastroparesis   . Tuberculosis    Social History   Socioeconomic History  . Marital status: Single    Spouse name: Not on file  . Number of children: 3  . Years of education: Not on file  . Highest education level: Not on file  Occupational History  . Occupation: unemployed   Tobacco Use  . Smoking status: Former Smoker    Types: Cigars, Cigarettes  . Smokeless tobacco: Never Used  Vaping Use  . Vaping Use: Never used  Substance and Sexual Activity  . Alcohol use: No  . Drug use: No  . Sexual activity: Not on file  Other Topics Concern  . Not on file  Social History Narrative   Resides with daughter only weekends    Social Determinants of Health   Financial  Resource Strain:   . Difficulty of Paying Living Expenses: Not on file  Food Insecurity:   . Worried About Charity fundraiser in the Last Year: Not on file  . Ran Out of Food in the Last Year: Not on file  Transportation Needs:   . Lack of Transportation (Medical): Not on file  . Lack of Transportation (Non-Medical): Not on file  Physical Activity:   . Days of Exercise per Week: Not on file  . Minutes of Exercise per Session: Not on file  Stress:   . Feeling of Stress : Not on file  Social Connections:   . Frequency of Communication with Friends and Family: Not on file  . Frequency of Social Gatherings with Friends and Family: Not on file  . Attends Religious Services: Not on file  . Active Member of Clubs or Organizations: Not on file  . Attends Archivist Meetings: Not on file  . Marital Status: Not on file   Family History  Problem Relation Age of Onset  . Diabetes Mother   . Diabetes Father    Scheduled Meds: . acidophilus  2 capsule Oral TID  . aspirin EC  81 mg Oral Daily  . benztropine  0.5 mg Oral BID  . DULoxetine  30 mg Oral Daily  . enoxaparin (LOVENOX) injection  30 mg Subcutaneous Q24H  . feeding supplement (GLUCERNA SHAKE)  237 mL Oral TID BM  . ferrous sulfate  325 mg Oral BID PC  . haloperidol  0.5 mg Oral BID  . influenza vac split quadrivalent PF  0.5 mL Intramuscular Tomorrow-1000  . insulin aspart  0-9 Units Subcutaneous TID WC  . insulin glargine  8 Units Subcutaneous QHS  . megestrol  200 mg Oral Daily  . multivitamin with minerals  1 tablet Oral Daily  . pantoprazole  40 mg Oral Daily  . sodium bicarbonate  1,300 mg Oral BID PC   Continuous Infusions: . sodium chloride 100 mL/hr at 07/28/20 1823   PRN Meds:.acetaminophen **OR** acetaminophen, alum & mag hydroxide-simeth, loperamide, ondansetron **OR** ondansetron (ZOFRAN) IV Medications Prior to Admission:  Prior to Admission medications   Medication Sig Start Date End Date Taking?  Authorizing Provider  aspirin EC 81 MG tablet Take 81 mg by mouth daily.   Yes [provider]  benztropine (COGENTIN) 0.5 MG tablet Take 1 tablet (0.5 mg total) by mouth 2 (two) times daily. 04/09/20  Yes Pokhrel, Laxman, MD  DULoxetine (CYMBALTA) 30 MG capsule Take 1 capsule (30 mg total) by mouth daily. 04/10/20  Yes  Pokhrel, Laxman, MD  ferrous sulfate 325 (65 FE) MG EC tablet Take 1 tablet (325 mg total) by mouth 2 (two) times daily. 05/26/20 09/23/20 Yes [name], MD  haloperidol (HALDOL) 0.5 MG tablet Take 1 tablet (0.5 mg total) by mouth 2 (two) times daily. 04/09/20  Yes Pokhrel, Laxman, MD  insulin glargine (LANTUS) 100 UNIT/ML injection Inject 0.08 mLs (8 Units total) into the skin daily. 05/13/20  Yes Fritzi Mandes, MD  loperamide (IMODIUM) 2 MG capsule Take 1 capsule (2 mg total) by mouth 2 (two) times daily as needed for diarrhea or loose stools. 02/09/20  Yes Wieting, Richard, MD  Multiple Vitamin (MULTIVITAMIN WITH MINERALS) TABS tablet Take 1 tablet by mouth daily. 05/13/20  Yes Fritzi Mandes, MD  NEXIUM 40 MG capsule Take 40 mg by mouth daily. 04/21/20  Yes [provider]  sodium bicarbonate 650 MG tablet Take 2 tablets (1,300 mg total) by mouth 2 (two) times daily. 04/09/20  Yes Pokhrel, Laxman, MD  sodium polystyrene (KAYEXALATE) 15 GM/60ML suspension Take 60 mLs (15 g total) by mouth every other day. 06/19/20  Yes Sharen Hones, MD  colesevelam [facility] [place]) 625 MG tablet Take 3 tablets (1,875 mg total) by mouth 2 (two) times daily with a meal. 02/09/20   Wieting, Richard, MD  feeding supplement, GLUCERNA SHAKE, (GLUCERNA SHAKE) LIQD Take 237 mLs by mouth 3 (three) times daily between meals. 05/13/20   Fritzi Mandes, MD  pantoprazole (PROTONIX) 40 MG tablet Take 1 tablet (40 mg total) by mouth daily. 04/03/20   Clapacs, Madie Reno, MD   No Known Allergies Review of Systems  Constitutional: Positive for fatigue.    Physical Exam Pulmonary:     Effort: Pulmonary effort is normal.    Neurological:     Mental Status: He is alert.     Vital Signs: BP 110/78 (BP Location: Left Arm)   Pulse 83   Temp 98.1 F (36.7 C) (Oral)   Resp 16   Ht 5\' 8"  (1.727 m)   Wt 44 kg   SpO2 100%   BMI 14.75 kg/m  Pain Scale: 0-10   Pain Score: 0-No pain   SpO2: SpO2: 100 % O2 Device:SpO2: 100 % O2 Flow Rate: .   IO: Intake/output summary:   Intake/Output Summary (Last 24 hours) at 07/29/2020 1509 Last data filed at 07/29/2020 1300 Gross per 24 hour  Intake 720 ml  Output 1100 ml  Net -380 ml    LBM: Last BM Date: 07/29/20 Baseline Weight: Weight: 45.4 kg Most recent weight: Weight: 44 kg     Palliative Assessment/Data:     Time In: 2:45 Time Out: 3:20 Time Total: 35 min Greater than 50%  of this time was spent counseling and coordinating care related to the above assessment and plan.  Signed by: Asencion Gowda, NP   Please contact Palliative Medicine Team phone at [phone] for questions and concerns.  For individual provider: See Shea Evans

## 2020-07-29 NOTE — Progress Notes (Signed)
Pt AAox4, spanish speaking. Speaks enough English to provide basic care. Please use interpretor for educational purposes. Pt with no complaints today. Up OOB to chair and ambulated in halls. IV fluids infusing. Pt eating 75%-100% of all meals. No adverse events or acute changes. Will continue to monitor.

## 2020-07-30 LAB — BASIC METABOLIC PANEL
Anion gap: 5 (ref 5–15)
BUN: 27 mg/dL — ABNORMAL HIGH (ref 6–20)
CO2: 17 mmol/L — ABNORMAL LOW (ref 22–32)
Calcium: 8.5 mg/dL — ABNORMAL LOW (ref 8.9–10.3)
Chloride: 110 mmol/L (ref 98–111)
Creatinine, Ser: 2.04 mg/dL — ABNORMAL HIGH (ref 0.61–1.24)
GFR, Estimated: 38 mL/min — ABNORMAL LOW (ref >60)
Glucose, Bld: 308 mg/dL — ABNORMAL HIGH (ref 70–99)
Potassium: 5.6 mmol/L — ABNORMAL HIGH (ref 3.5–5.1)
Sodium: 132 mmol/L — ABNORMAL LOW (ref 135–145)

## 2020-07-30 LAB — URINE CULTURE

## 2020-07-30 LAB — GLUCOSE, CAPILLARY
Glucose-Capillary: 281 mg/dL — ABNORMAL HIGH (ref 70–99)
Glucose-Capillary: 232 mg/dL — ABNORMAL HIGH (ref 70–99)
Glucose-Capillary: 295 mg/dL — ABNORMAL HIGH (ref 70–99)
Glucose-Capillary: 259 mg/dL — ABNORMAL HIGH (ref 70–99)

## 2020-07-30 MED ORDER — LOPERAMIDE HCL 2 MG PO CAPS
2.0000 mg | ORAL_CAPSULE | Freq: Four times a day (QID) | ORAL | Status: DC
  Administered 2020-07-30 (×3): 2 mg via ORAL
  Filled 2020-07-30 (×3): qty 1

## 2020-07-30 NOTE — Progress Notes (Signed)
Inpatient Diabetes Program Recommendations  AACE/ADA: New Consensus Statement on Inpatient Glycemic Control (2015)  Target Ranges:  Prepandial:   less than 140 mg/dL      Peak postprandial:   less than 180 mg/dL (1-2 hours)      Critically ill patients:  140 - 180 mg/dL   Lab Results  Component Value Date   GLUCAP 281 (H) 07/30/2020   HGBA1C 8.5 (H) 07/26/2020    Review of Glycemic Control Results for INGRAM, ONNEN (MRN [id number]) as of 07/30/2020 11:24  Ref. Range 07/29/2020 08:12 07/29/2020 11:38 07/29/2020 17:22 07/29/2020 18:58 07/29/2020 20:50 07/30/2020 07:45  Glucose-Capillary Latest Ref Range: 70 - 99 mg/dL 178 (H) 166 (H) 302 (H) 291 (H) 188 (H) 281 (H)   Inpatient Diabetes Program Recommendations:   -Increase Lantus to 10 units daily -Add Novolog 2 units tid meal coverage if eats 50% meal   Thank you, Bethena Roys E. Halleigh Comes, RN, MSN, CDE  Diabetes Coordinator Inpatient Glycemic Control Team Team Pager [phone] (8am-5pm) 07/30/2020 11:33 AM

## 2020-07-30 NOTE — Progress Notes (Addendum)
[facility] Progress Note  Patient: Gregory Moody    QMV:[id number]  DOA: 07/26/2020     Date of Service: the patient was seen and examined on 07/30/2020  Brief hospital course: Gregory Moody a 44 y.o.malewith medical historysignificant fordiabetes mellitus, GERD, gastroparesis, DKA, anemia, tobacco abuse, esophageal candidiasis, COVID-19 infection 04/23/2019, recent C. difficile colitis,who presentsto the ER for evaluation of generalized weakness and a fall?? Syncopal episode. Patient states that he has had very poor oral intake and does not have an appetite. He denies having any abdominal pain but has chronic diarrhea. He denies having any nausea or vomiting. Patient states that he had multiple falls 1 day prior to his admission and thinks he may have passed out.Upon arrival to the ER he was noted to have a blood pressure 61/40 and received IV fluid resuscitation.Patient received 2 L of IV fluid with improvement in his blood pressure.  Currently plan is supportive care and treatment for chronic diarrhea.  Assessment and Plan: Generalized weakness Hypotension Multifactorial and secondary to poor oral intake, diarrhea as well as hypotension resulting in falls His appetite is improving encouraged to increase p.o. 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  Input was appreciated Per urology site does not appear infected on visual exam.   Drainage appears normal given recent SP tube placement.   Nothing further to do unless he develops fever, chills, nausea or vomiting, or lower abdominal pain or back pain system with UTI. Recommended ongoing wound care and keeping the exposed part of the catheter clean with  application of warm soapy water once daily  Dehydration- Secondary to poor oral intake Encourage p.o. intake   A/CKD-stage iv- likely prerenal due to decreased p.o. intake and dehydration, chronic diarrhea Improving and close to baseline.  Nicotine dependence Was counseled on smoking cessation  Declined nicotine patch  Adult failure to thrive/severe malnutrition related to chronic illness Body mass index is 14.75 kg/m.  RD following  Continue with feeding supplements  Continue Megace  continue feeding supplements Encourage p.o. intake which he is doing better than other days  Hyponatremia on admission with sodium 128  Likely secondary to hypoglycemia as well as decreased p.o. intake  Improving with IV hydration.  Chronic diarrhea on going. On loperamide prn Extensive work-up in April which was negative. Recommend outpatient follow-up with GI. We will schedule Imodium.  Goals of care conversation Due to recurrent admission palliative care was consulted. For some reason the conversation went on a PEG tube and the patient would like to have a PEG tube placed on that conversation. I explained to the patient that the PEG tube is not indicated for now. Per RD note on 07/28/2020 patient is eating 2000+ calories per day. Patient finished his whole lunch on 07/30/2020 when I was doing the interview. Based on this evaluation I do not think the patient would benefit from PEG tube placement.  Patient verbalized understanding and had no further questions.  Body mass index is 14.75 kg/m.  Nutrition Problem: Severe Malnutrition Etiology: chronic illness (uncontrolled DM, CKD, previously had food insecurity and difficulty obtaining food at home per his report) Interventions: Interventions: Refer to RD note for recommendations    Diet: Regular diet DVT Prophylaxis:   enoxaparin (LOVENOX) injection 30 mg Start: 07/26/20 2200  Advance goals of care discussion: Full  code  Family Communication: no family was present at bedside, at the time of interview.   Disposition:  Status is: Inpatient  Remains inpatient appropriate because:IV treatments appropriate due to intensity of illness or inability to take PO   Dispo: The patient is from: Home              Anticipated d/c is to: Home              Anticipated d/c date is: 1 day              Patient currently is not medically stable to d/c.  Subjective: Oral intake is adequate.  Patient has 4 episodes of loose BM today.  No nausea no vomiting.  No abdominal pain.  No blood in the stool.  Physical Exam:  General: Appear in mild distress, no Rash; Oral Mucosa Clear, moist. no Abnormal Neck Mass Or lumps, Conjunctiva normal  Cardiovascular: S1 and S2 Present, no Murmur, Respiratory: good respiratory effort, Bilateral Air entry present and CTA, no Crackles, no wheezes Abdomen: Bowel Sound present, Soft and no tenderness Extremities: no Pedal edema Neurology: alert and oriented to time, place, and person affect appropriate. no new focal deficit Gait not checked due to patient safety concerns  Vitals:   07/30/20 0416 07/30/20 0743 07/30/20 1157 07/30/20 1539  BP: 124/83 106/83 (!) 138/95 (!) 136/96  Pulse: 84 77 91 98  Resp: 18 18 18 16   Temp: 98.2 F (36.8 C) 99.7 F (37.6 C) 99.2 F (37.3 C) 99.4 F (37.4 C)  TempSrc: Oral Axillary Axillary Oral  SpO2: 100% 100% 100% 100%  Weight:      Height:        Intake/Output Summary (Last 24 hours) at 07/30/2020 1847 Last data filed at 07/30/2020 1440 Gross per 24 hour  Intake 480 ml  Output 600 ml  Net -120 ml   Filed Weights   07/26/20 1159 07/26/20 2132  Weight: 45.4 kg 44 kg    Data Reviewed: I have personally reviewed and interpreted daily labs, tele strips, imagings as discussed above. I reviewed all nursing notes, pharmacy notes, vitals, pertinent old records I have discussed plan of care as described above with RN and  patient/family.  CBC: Recent Labs  Lab 07/26/20 1219 07/27/20 0534  WBC 5.0 4.3  NEUTROABS 3.0  --   HGB 8.3* 8.0*  HCT 24.2* 22.5*  MCV 90.6 87.9  PLT 272 614   Basic Metabolic Panel: Recent Labs  Lab 07/26/20 1219 07/27/20 0534 07/28/20 0301 07/30/20 0617  NA 128* 132* 131* 132*  K 3.8 4.0 3.9 5.6*  CL 103 107 106 110  CO2 18* 18* 18* 17*  GLUCOSE 328* 263* 152* 308*  BUN 28* 25* 24* 27*  CREATININE 2.68* 2.30* 2.28* 2.04*  CALCIUM 8.6* 8.3* 8.5* 8.5*  MG 2.1  --   --   --     Studies: No results found.  Scheduled Meds: . acidophilus  2 capsule Oral TID  . aspirin EC  81 mg Oral Daily  . benztropine  0.5 mg Oral BID  . DULoxetine  30 mg Oral Daily  . enoxaparin (LOVENOX) injection  30 mg Subcutaneous Q24H  . feeding supplement (GLUCERNA SHAKE)  237 mL Oral TID BM  . ferrous sulfate  325 mg Oral BID PC  . haloperidol  0.5 mg Oral BID  . influenza vac split quadrivalent PF  0.5 mL Intramuscular Tomorrow-1000  . insulin aspart  0-9 Units Subcutaneous TID WC  . insulin glargine  8 Units Subcutaneous QHS  . loperamide  2 mg Oral QID  . megestrol  200 mg Oral Daily  . multivitamin with minerals  1 tablet Oral Daily  . pantoprazole  40 mg Oral Daily  . sodium bicarbonate  1,300 mg Oral BID PC   Continuous Infusions: PRN Meds: acetaminophen **OR** acetaminophen, alum & mag hydroxide-simeth, loperamide, ondansetron **OR** ondansetron (ZOFRAN) IV  Time spent: 35 minutes  Author: Berle Mull, MD [facility] 07/30/2020 6:47 PM  To reach On-call, see care teams to locate the attending and reach out via [email or web address]. Between 7PM-7AM, please contact night-coverage If you still have difficulty reaching the attending provider, please page the [facility] (Director on Call) for [facility] on amion for assistance.

## 2020-07-31 ENCOUNTER — Other Ambulatory Visit: Payer: Self-pay

## 2020-07-31 LAB — COMPREHENSIVE METABOLIC PANEL
ALT: 30 U/L (ref 0–44)
AST: 26 U/L (ref 15–41)
Albumin: 2.8 g/dL — ABNORMAL LOW (ref 3.5–5.0)
Alkaline Phosphatase: 98 U/L (ref 38–126)
Anion gap: 8 (ref 5–15)
BUN: 28 mg/dL — ABNORMAL HIGH (ref 6–20)
CO2: 17 mmol/L — ABNORMAL LOW (ref 22–32)
Calcium: 8.3 mg/dL — ABNORMAL LOW (ref 8.9–10.3)
Chloride: 107 mmol/L (ref 98–111)
Creatinine, Ser: 2.04 mg/dL — ABNORMAL HIGH (ref 0.61–1.24)
GFR, Estimated: 40 mL/min — ABNORMAL LOW (ref >60)
Glucose, Bld: 304 mg/dL — ABNORMAL HIGH (ref 70–99)
Potassium: 5 mmol/L (ref 3.5–5.1)
Sodium: 132 mmol/L — ABNORMAL LOW (ref 135–145)
Total Bilirubin: 0.4 mg/dL (ref 0.3–1.2)
Total Protein: 5.7 g/dL — ABNORMAL LOW (ref 6.5–8.1)

## 2020-07-31 LAB — BASIC METABOLIC PANEL
Anion gap: 7 (ref 5–15)
BUN: 28 mg/dL — ABNORMAL HIGH (ref 6–20)
CO2: 17 mmol/L — ABNORMAL LOW (ref 22–32)
Calcium: 8.2 mg/dL — ABNORMAL LOW (ref 8.9–10.3)
Chloride: 108 mmol/L (ref 98–111)
Creatinine, Ser: 2.08 mg/dL — ABNORMAL HIGH (ref 0.61–1.24)
GFR, Estimated: 37 mL/min — ABNORMAL LOW (ref >60)
Glucose, Bld: 316 mg/dL — ABNORMAL HIGH (ref 70–99)
Potassium: 5.6 mmol/L — ABNORMAL HIGH (ref 3.5–5.1)
Sodium: 132 mmol/L — ABNORMAL LOW (ref 135–145)

## 2020-07-31 LAB — CBC WITH DIFFERENTIAL/PLATELET
Abs Immature Granulocytes: 0.01 10*3/uL (ref 0.00–0.07)
Basophils Absolute: 0 10*3/uL (ref 0.0–0.1)
Basophils Relative: 1 %
Eosinophils Absolute: 0.2 10*3/uL (ref 0.0–0.5)
Eosinophils Relative: 3 %
HCT: 22.1 % — ABNORMAL LOW (ref 39.0–52.0)
Hemoglobin: 7.4 g/dL — ABNORMAL LOW (ref 13.0–17.0)
Immature Granulocytes: 0 %
Lymphocytes Relative: 36 %
Lymphs Abs: 2.2 10*3/uL (ref 0.7–4.0)
MCH: 30.8 pg (ref 26.0–34.0)
MCHC: 33.5 g/dL (ref 30.0–36.0)
MCV: 92.1 fL (ref 80.0–100.0)
Monocytes Absolute: 0.4 10*3/uL (ref 0.1–1.0)
Monocytes Relative: 6 %
Neutro Abs: 3.3 10*3/uL (ref 1.7–7.7)
Neutrophils Relative %: 54 %
Platelets: 264 10*3/uL (ref 150–400)
RBC: 2.4 MIL/uL — ABNORMAL LOW (ref 4.22–5.81)
RDW: 15.2 % (ref 11.5–15.5)
WBC: 6.1 10*3/uL (ref 4.0–10.5)
nRBC: 0 % (ref 0.0–0.2)

## 2020-07-31 LAB — GLUCOSE, CAPILLARY
Glucose-Capillary: 289 mg/dL — ABNORMAL HIGH (ref 70–99)
Glucose-Capillary: 306 mg/dL — ABNORMAL HIGH (ref 70–99)

## 2020-07-31 LAB — PROCALCITONIN: Procalcitonin: <0.1 ng/mL

## 2020-07-31 MED ORDER — FERROUS SULFATE 325 (65 FE) MG PO TBEC: 325.0000 mg | DELAYED_RELEASE_TABLET | Freq: Three times a day (TID) | ORAL | 0 refills | Status: AC

## 2020-07-31 MED ORDER — COLESEVELAM HCL 625 MG PO TABS: 1875.0000 mg | ORAL_TABLET | Freq: Two times a day (BID) | ORAL | 0 refills | Status: AC

## 2020-07-31 MED ORDER — PATIROMER SORBITEX CALCIUM 8.4 G PO PACK: 8.4000 g | PACK | Freq: Every day | ORAL | 0 refills | Status: DC

## 2020-07-31 MED ORDER — MEGESTROL ACETATE 400 MG/10ML PO SUSP: 200.0000 mg | Freq: Every day | ORAL | 0 refills | Status: AC

## 2020-07-31 MED ORDER — SODIUM BICARBONATE 650 MG PO TABS
1300.0000 mg | ORAL_TABLET | Freq: Two times a day (BID) | ORAL | 0 refills | Status: DC
Start: 2020-07-31 — End: 2020-07-31

## 2020-07-31 MED ORDER — LOPERAMIDE HCL 2 MG PO CAPS
2.0000 mg | ORAL_CAPSULE | Freq: Four times a day (QID) | ORAL | 0 refills | Status: AC | PRN
Start: 2020-07-31 — End: ?

## 2020-07-31 MED ORDER — LOPERAMIDE HCL 2 MG PO CAPS: 2.0000 mg | ORAL_CAPSULE | Freq: Four times a day (QID) | ORAL | 0 refills | Status: DC | PRN

## 2020-07-31 MED ORDER — COLESEVELAM HCL 625 MG PO TABS: 1875.0000 mg | ORAL_TABLET | Freq: Two times a day (BID) | ORAL | 0 refills | Status: DC

## 2020-07-31 MED ORDER — NEXIUM 40 MG PO CPDR: 40.0000 mg | DELAYED_RELEASE_CAPSULE | Freq: Every day | ORAL | 0 refills | Status: AC

## 2020-07-31 MED ORDER — INSULIN GLARGINE 100 UNIT/ML ~~LOC~~ SOLN: 8.0000 [IU] | Freq: Every day | SUBCUTANEOUS | 0 refills | Status: DC

## 2020-07-31 MED ORDER — SODIUM BICARBONATE 650 MG PO TABS: 1300.0000 mg | ORAL_TABLET | Freq: Two times a day (BID) | ORAL | 0 refills | Status: AC

## 2020-07-31 MED ORDER — MEGESTROL ACETATE 400 MG/10ML PO SUSP: 200.0000 mg | Freq: Every day | ORAL | 0 refills | Status: DC

## 2020-07-31 MED ORDER — PATIROMER SORBITEX CALCIUM 8.4 G PO PACK: 8.4000 g | PACK | Freq: Every day | ORAL | 0 refills | Status: AC

## 2020-07-31 MED ORDER — SODIUM ZIRCONIUM CYCLOSILICATE 10 G PO PACK
10.0000 g | PACK | Freq: Once | ORAL | Status: AC
  Administered 2020-07-31: 09:00:00 10 g via ORAL
  Filled 2020-07-31: qty 1

## 2020-07-31 NOTE — Progress Notes (Signed)
Received MD order to discharge patient to home, reviewed with Spanish interpretor - discharge instructions follow up appointments, home meds and prescriptions with patient and patient verbalized understanding.  Three meds (ferrous sulfate, insulin glargine and Nexium) from [facility] given to patient to take home the rest of the prescriptions the patient is to pick up from [facility] in [place], [place].

## 2020-07-31 NOTE — TOC Transition Note (Signed)
Transition of Care [facility]) - CM/SW Discharge Note   Patient Details  Name: Gregory Moody MRN: [id number] Date of Birth: 1976-08-20  [facility] CM/SW Contact:  Shelbie Hutching, RN Phone Number: 07/31/2020, 3:26 PM   Clinical Narrative:    Insulin, nexium, and ferrous sulfate filled by [facility] the other prescriptions they did not have.  Other scripts to be sent to the [facility] center for pick up.  Patient needs a ride home, [place] Voucher provided for patient to get home.    Final next level of care: Home/Self Care Barriers to Discharge: Barriers Resolved   Patient Goals and CMS Choice Patient states their goals for this hospitalization and ongoing recovery are:: Patient states "I want to be healthy, wishes he could be healthy" CMS Medicare.gov Compare Post Acute Care list provided to:: Patient Choice offered to / list presented to : Patient  Discharge Placement                       Discharge Plan and Services   Discharge Planning Services: CM Consult Post Acute Care Choice: Home Health                               Social Determinants of Health (SDOH) Interventions     Readmission Risk Interventions Readmission Risk Prevention Plan 07/28/2020 05/09/2020 04/09/2020  Transportation Screening Complete Complete Complete  PCP or Specialist Appt within 3-5 Days - - -  Social Work Consult for [place] - - -  Medication Review Press photographer) Complete Complete Complete  PCP or Specialist appointment within 3-5 days of discharge Complete - Patient refused  [place] or Home Care Consult Complete - Not Complete  SW Recovery Care/Counseling Consult Complete - -  Palliative Care Screening Not Applicable - Complete  Skilled Nursing Facility Not Applicable Not Applicable Not Applicable  Some recent data might be hidden

## 2020-07-31 NOTE — Progress Notes (Addendum)
Inpatient Diabetes Program Recommendations  AACE/ADA: New Consensus Statement on Inpatient Glycemic Control (2015)  Target Ranges:  Prepandial:   less than 140 mg/dL      Peak postprandial:   less than 180 mg/dL (1-2 hours)      Critically ill patients:  140 - 180 mg/dL   Lab Results  Component Value Date   GLUCAP 306 (H) 07/31/2020   HGBA1C 8.5 (H) 07/26/2020    Review of Glycemic Control Results for DENNEY, SHEIN (MRN [id number]) as of 07/31/2020 13:25  Ref. Range 07/30/2020 12:10 07/30/2020 16:29 07/30/2020 21:25 07/31/2020 08:37 07/31/2020 11:50  Glucose-Capillary Latest Ref Range: 70 - 99 mg/dL 232 (H) 295 (H) 259 (H) 289 (H) 306 (H)   Inpatient Diabetes Program Recommendations:   -Increase Lantus to 10 units daily -Add Novolog 2 units tid meal coverage if eats 50% meal Secure chat sent to Dr. Posey Pronto.  Thank you, [name]. Kataryna Mcquilkin, RN, MSN, CDE  Diabetes Coordinator Inpatient Glycemic Control Team Team Pager [phone] (8am-5pm) 07/31/2020 1:26 PM

## 2020-07-31 NOTE — TOC Transition Note (Signed)
Transition of Care [facility]) - CM/SW Discharge Note   Patient Details  Name: Gregory Moody MRN: [id number] Date of Birth: [birth year]  [facility] CM/SW Contact:  Shelbie Hutching, RN Phone Number: 07/31/2020, 1:39 PM   Clinical Narrative:    Patient is medically cleared for discharge home.  RNCM unable to find a home health company to accept patient.  Referral given to [facility] in the past but everytime they tried to contact the patient or go out to see him he was never there.  Patient's medications sent over to [facility].  Patient instructed to pick up the prescriptions from there.  Patient reports that he has called a ride.    Final next level of care: Home/Self Care Barriers to Discharge: Barriers Resolved   Patient Goals and CMS Choice Patient states their goals for this hospitalization and ongoing recovery are:: Patient states "I want to be healthy, wishes he could be healthy" CMS Medicare.gov Compare Post Acute Care list provided to:: Patient Choice offered to / list presented to : Patient  Discharge Placement                       Discharge Plan and Services   Discharge Planning Services: CM Consult Post Acute Care Choice: Home Health                               Social Determinants of Health (SDOH) Interventions     Readmission Risk Interventions Readmission Risk Prevention Plan 07/28/2020 05/09/2020 04/09/2020  Transportation Screening Complete Complete Complete  PCP or Specialist Appt within 3-5 Days - - -  Social Work Consult for [place] - - -  Medication Review Press photographer) Complete Complete Complete  PCP or Specialist appointment within 3-5 days of discharge Complete - Patient refused  [place] or Home Care Consult Complete - Not Complete  SW Recovery Care/Counseling Consult Complete - -  Palliative Care Screening Not Applicable - Complete  Skilled Nursing  Facility Not Applicable Not Applicable Not Applicable  Some recent data might be hidden

## 2020-08-02 LAB — CULTURE, BLOOD (ROUTINE X 2)
Culture: NO GROWTH
Culture: NO GROWTH
Special Requests: ADEQUATE

## 2020-08-02 NOTE — Discharge Summary (Signed)
[facility] Discharge Summary   Patient: Gregory Moody ZHY:[id number]  PCP: [facility]  Date of admission: 07/26/2020   Date of discharge: 07/31/2020     Discharge Diagnoses:  Principal diagnosis Dehydration from poor p.o. intake and noncompliance with medical regimen.    GERD (gastroesophageal reflux disease)   Hypotension   Tobacco abuse   Chronic diarrhea   CKD (chronic kidney disease), stage IV (HCC)   FTT (failure to thrive) in adult  Admitted From: Home Disposition:  Home   Recommendations for Outpatient Follow-up:  1. PCP: Follow-up with PCP in 1 week. 2. Follow up LABS/TEST:  none   Follow-up Information    [facility] Schedule an appointment as soon as possible for a visit in 1 week.   Why: repeat CBC and BMP in 1 week Contact information: [place] [address] [phone]              Diet recommendation: Regular diet  Activity: The patient is advised to gradually reintroduce usual activities, as tolerated  Discharge Condition: stable  Code Status: Full code   History of present illness: As per the H and P dictated on admission, "[name] is a 44 y.o. male with medical history significant fordiabetes mellitus, GERD, gastroparesis, DKA, anemia, tobacco abuse, esophageal candidiasis, COVID-19 infection 04/23/2019, recent C. difficile colitis,who presents to the ER for evaluation of generalized weakness and a fall ?? Syncopal episode. Patient states that he has had very poor oral intake and does not have an appetite.  He denies having any abdominal pain but has chronic diarrhea.  He denies having any nausea or vomiting.  Patient states that he had multiple falls 1 day prior to his admission and thinks he may have passed out. Upon arrival to the ER he was noted to have a blood pressure 61/40 and received IV fluid resuscitation.  Patient received 2 L of IV fluid with improvement in  his blood pressure. He denies having any chest pain, no shortness of breath, no abdominal pain, no urinary symptoms, no nausea, no vomiting. Labs show sodium 128, potassium 3.8, chloride 103, bicarb 18, glucose 328, BUN 28, creatinine 2.68, calcium 8.6, anion gap 7, magnesium 2.1, alkaline phosphatase 73, albumin 3.1, AST 17, ALT 33, total protein 6.3, lactic acid 1.6, white count 5.0, hemoglobin 8.3, hematocrit 24.2, MCV 90.6, RDW 15.3, platelet count 272 Respiratory viral panel is negative Chest x-ray reviewed by me shows no acute cardiopulmonary disease Lumbar spine x-ray shows no acute findings Twelve-lead EKG shows sinus rhythm with early repolarization changes."  Hospital Course:  Summary of his active problems in the hospital is as following.  Dehydration from poor p.o. intake as well as ongoing chronic diarrhea. Recurrent admission with generalized weakness Hypotension Multifactorial and secondary to poor oral intake, diarrhea as well as hypotension resulting in falls His appetite is improving. Patient is able to tolerate p.o. diet as well as eating more than 2000 cal/day. Encouraged to increase p.o. 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 New prescription sent.  Purulent drainage around suprapubic cather- urology consulted.  Input was appreciated Per urology site does not appear infected on visual exam.  Drainage appears normal given recent SP tube placement.  Nothing further to do unless he develops fever, chills, nausea or vomiting, or lower abdominal pain or back pain system with UTI. Recommended  ongoing wound care and keeping the exposed part of the catheter clean with application of warm soapy water once daily  Acute on CKD-stage iv likely prerenal due to decreased p.o. intake and dehydration, chronic diarrhea Improving and  close to baseline.  Nicotine dependence Was counseled on smoking cessation Declined nicotine patch  Adult failure to thrive/severe malnutrition related to chronic illness Body mass index is 14.75 kg/m.  RD following  Continue with feeding supplements  Continue Megace  continue feeding supplements Encourage p.o. intake which he is doing better than other days  Hyponatremia on admission with sodium 128  Likely secondary to hypoglycemia as well as decreased p.o. intake Improving with IV hydration.  Chronic diarrhea on going. On loperamide prn Extensive work-up in April which was negative. Recommend outpatient follow-up with GI. We will schedule Imodium.  Goals of care conversation Due to recurrent admission palliative care was consulted. For some reason the conversation went on a PEG tube and the patient would like to have a PEG tube placed on that conversation. I explained to the patient that the PEG tube is not indicated for now. Per RD note on 07/28/2020 patient is eating 2000+ calories per day. Patient finished his whole lunch on 07/30/2020 when I was doing the interview. Based on this evaluation I do not think the patient would benefit from PEG tube placement.  Patient verbalized understanding and had no further questions. Based on chart review suspect patient's primary issue is noncompliance with not taking his medication as prescribed which will help with his diarrhea and then recurrent admissions with hypotension and generalized fatigue with poor p.o. intake.  Body mass index is 14.75 kg/m.  Nutrition Problem: Severe Malnutrition Etiology: chronic illness (uncontrolled DM, CKD, previously had food insecurity and difficulty obtaining food at home per his report) Nutrition Interventions: Interventions: Refer to RD note for recommendations  Patient was seen by physical therapy, who recommended Home health, was arranged. On the day of the discharge the patient's  vitals were stable, and no other acute medical condition were reported by patient. The patient was felt safe to be discharge at Home with Home health.  Consultants: Palliative care Urology  Procedures: none  Discharge Exam: General: Appear in no distress, no Rash; Oral Mucosa Clear, moist. no Abnormal Neck Mass Or lumps, Conjunctiva normal  Cardiovascular: S1 and S2 Present, no Murmur Respiratory: good respiratory effort, Bilateral Air entry present and CTA, no Crackles, no wheezes Abdomen: Bowel Sound present, Soft and no tenderness Extremities: no Pedal edema Neurology: alert and oriented to time, place, and person affect appropriate. no new focal deficit  Filed Weights   07/26/20 1159 07/26/20 2132  Weight: 45.4 kg 44 kg   Vitals:   07/31/20 0809 07/31/20 1146  BP: 124/88 128/82  Pulse: 85 89  Resp: 17 16  Temp: (!) 100.4 F (38 C) 98.1 F (36.7 C)  SpO2: 100% 100%    DISCHARGE MEDICATION: Allergies as of 07/31/2020   No Known Allergies     Medication List    STOP taking these medications   pantoprazole 40 MG tablet Commonly known as: PROTONIX   sodium polystyrene 15 GM/60ML suspension Commonly known as: KAYEXALATE     TAKE these medications   aspirin EC 81 MG tablet Take 81 mg by mouth daily.   benztropine 0.5 MG tablet Commonly known as: COGENTIN Take 1 tablet (0.5 mg total) by mouth 2 (two) times daily.   colesevelam 625 MG tablet Commonly known as: WELCHOL Take 3 tablets (1,875  mg total) by mouth 2 (two) times daily with a meal.   DULoxetine 30 MG capsule Commonly known as: CYMBALTA Take 1 capsule (30 mg total) by mouth daily.   feeding supplement (GLUCERNA SHAKE) Liqd Take 237 mLs by mouth 3 (three) times daily between meals.   ferrous sulfate 325 (65 FE) MG EC tablet Take 1 tablet (325 mg total) by mouth 3 (three) times daily with meals. What changed: when to take this   haloperidol 0.5 MG tablet Commonly known as: HALDOL Take 1 tablet  (0.5 mg total) by mouth 2 (two) times daily.   insulin glargine 100 UNIT/ML injection Commonly known as: LANTUS Inject 0.08 mLs (8 Units total) into the skin daily.   loperamide 2 MG capsule Commonly known as: IMODIUM Take 1 capsule (2 mg total) by mouth 4 (four) times daily as needed for diarrhea or loose stools. What changed: when to take this   megestrol 400 MG/10ML suspension Commonly known as: MEGACE Take 5 mLs (200 mg total) by mouth daily.   multivitamin with minerals Tabs tablet Take 1 tablet by mouth daily.   NexIUM 40 MG capsule Generic drug: esomeprazole Take 1 capsule (40 mg total) by mouth daily.   patiromer 8.4 g packet Commonly known as: VELTASSA Take 1 packet (8.4 g total) by mouth daily.   sodium bicarbonate 650 MG tablet Take 2 tablets (1,300 mg total) by mouth 2 (two) times daily.      No Known Allergies Discharge Instructions    Diet - low sodium heart healthy   Complete by: As directed    Increase activity slowly   Complete by: As directed       The results of significant diagnostics from this hospitalization (including imaging, microbiology, ancillary and laboratory) are listed below for reference.    Significant Diagnostic Studies: DG Chest 1 View  Result Date: 07/26/2020 CLINICAL DATA:  Chills and frequent falls. Low blood pressure today. History of COVID-19 infection. EXAM: CHEST  1 VIEW COMPARISON:  06/09/2020. FINDINGS: Masslike opacity in the right upper lobe bordering the minor fissure, stable from the prior exam and from a CT dated 12/29/2019. Remainder of the lungs is clear. No pleural effusion or pneumothorax. Cardiac silhouette is normal in size. Normal mediastinal and hilar contours. Skeletal structures are grossly intact. IMPRESSION: 1. No acute cardiopulmonary disease. 2. Stable masslike opacity in the right upper lobe adjacent to the minor fissure. Although stable since the more recent prior radiographs and a chest CT dated  12/29/2019, the finding is new from a more remote chest radiograph dated 08/04/2015. Neoplasm is not excluded. Electronically Signed   By: Lajean Manes M.D.   On: 07/26/2020 13:18   DG Lumbar Spine 2-3 Views  Result Date: 07/26/2020 CLINICAL DATA:  Patient fell yesterday. Presents with low back pain. EXAM: LUMBAR SPINE - 2-3 VIEW COMPARISON:  None. FINDINGS: No fracture. Grade 1 anterolisthesis of L5 on S1.  No other spondylolisthesis. Disc spaces are well preserved.  Soft tissues are unremarkable. IMPRESSION: 1. No fracture or acute finding. 2. Grade 1 anterolisthesis of L5 on S1. This is likely degenerative in origin. No defined pars defects. Electronically Signed   By: Lajean Manes M.D.   On: 07/26/2020 13:20    Microbiology: Recent Results (from the past 240 hour(s))  Respiratory Panel by RT PCR (Flu A&B, Covid) - Nasopharyngeal Swab     Status: None   Collection Time: 07/26/20  1:23 PM   Specimen: Nasopharyngeal Swab  Result Value Ref Range  Status   SARS Coronavirus 2 by RT PCR NEGATIVE NEGATIVE Final    Comment: (NOTE) SARS-CoV-2 target nucleic acids are NOT DETECTED.  The SARS-CoV-2 RNA is generally detectable in upper respiratoy specimens during the acute phase of infection. The lowest concentration of SARS-CoV-2 viral copies this assay can detect is 131 copies/mL. A negative result does not preclude SARS-Cov-2 infection and should not be used as the sole basis for treatment or other patient management decisions. A negative result may occur with  improper specimen collection/handling, submission of specimen other than nasopharyngeal swab, presence of viral mutation(s) within the areas targeted by this assay, and inadequate number of viral copies (<131 copies/mL). A negative result must be combined with clinical observations, patient history, and epidemiological information. The expected result is Negative.  Fact Sheet for Patients:   PinkCheek.be  Fact Sheet for Healthcare Providers:  GravelBags.it  This test is no t yet approved or cleared by the [place] FDA and  has been authorized for detection and/or diagnosis of SARS-CoV-2 by FDA under an Emergency Use Authorization (EUA). This EUA will remain  in effect (meaning this test can be used) for the duration of the COVID-19 declaration under Section 564(b)(1) of the Act, 21 U.S.C. section 360bbb-3(b)(1), unless the authorization is terminated or revoked sooner.     Influenza A by PCR NEGATIVE NEGATIVE Final   Influenza B by PCR NEGATIVE NEGATIVE Final    Comment: (NOTE) The Xpert Xpress SARS-CoV-2/FLU/RSV assay is intended as an aid in  the diagnosis of influenza from Nasopharyngeal swab specimens and  should not be used as a sole basis for treatment. Nasal washings and  aspirates are unacceptable for Xpert Xpress SARS-CoV-2/FLU/RSV  testing.  Fact Sheet for Patients: PinkCheek.be  Fact Sheet for Healthcare Providers: GravelBags.it  This test is not yet approved or cleared by the [place] FDA and  has been authorized for detection and/or diagnosis of SARS-CoV-2 by  FDA under an Emergency Use Authorization (EUA). This EUA will remain  in effect (meaning this test can be used) for the duration of the  Covid-19 declaration under Section 564(b)(1) of the Act, 21  U.S.C. section 360bbb-3(b)(1), unless the authorization is  terminated or revoked. Performed at [facility], [address] [place], [place] [address]   Urine Culture     Status: Abnormal   Collection Time: 07/28/20  8:49 AM   Specimen: Urine, Random  Result Value Ref Range Status   Specimen Description   Final    URINE, RANDOM Performed at [facility], [address] [place], [place] [address]    Special Requests   Final    NONE Performed at  [facility], [address] [place], [place] [address]    Culture MULTIPLE SPECIES PRESENT, SUGGEST RECOLLECTION (A)  Final   Report Status 07/30/2020 FINAL  Final  CULTURE, BLOOD (ROUTINE X 2) w Reflex to ID Panel     Status: None   Collection Time: 07/28/20 10:31 AM   Specimen: BLOOD  Result Value Ref Range Status   Specimen Description BLOOD LEFT ANTECUBITAL  Final   Special Requests   Final    BOTTLES DRAWN AEROBIC AND ANAEROBIC Blood Culture results may not be optimal due to an excessive volume of blood received in culture bottles   Culture   Final    NO GROWTH 5 DAYS Performed at [facility], [address], [place], [place] [address]    Report Status 08/02/2020 FINAL  Final  CULTURE, BLOOD (ROUTINE X 2) w Reflex to ID Panel     Status: None   Collection Time: 07/28/20 10:32 AM   Specimen: BLOOD  Result Value Ref Range Status   Specimen Description BLOOD LEFT ANTECUBITAL  Final   Special Requests   Final    BOTTLES DRAWN AEROBIC AND ANAEROBIC Blood Culture adequate volume   Culture   Final    NO GROWTH 5 DAYS Performed at [facility], [address] [place], [place] [address]    Report Status 08/02/2020 FINAL  Final     Labs: CBC: Recent Labs  Lab 07/27/20 0534 07/31/20 1032  WBC 4.3 6.1  NEUTROABS  --  3.3  HGB 8.0* 7.4*  HCT 22.5* 22.1*  MCV 87.9 92.1  PLT 238 030   Basic Metabolic Panel: Recent Labs  Lab 07/27/20 0534 07/28/20 0301 07/30/20 0617 07/31/20 0420 07/31/20 1032  NA 132* 131* 132* 132* 132*  K 4.0 3.9 5.6* 5.6* 5.0  CL 107 106 110 108 107  CO2 18* 18* 17* 17* 17*  GLUCOSE 263* 152* 308* 316* 304*  BUN 25* 24* 27* 28* 28*  CREATININE 2.30* 2.28* 2.04* 2.08* 2.04*  CALCIUM 8.3* 8.5* 8.5* 8.2* 8.3*   Liver Function Tests: Recent Labs  Lab 07/31/20 1032  AST 26  ALT 30  ALKPHOS 98  BILITOT 0.4  PROT 5.7*  ALBUMIN 2.8*   CBG: Recent Labs  Lab 07/30/20 1210 07/30/20 1629 07/30/20 2125  07/31/20 0837 07/31/20 1150  GLUCAP 232* 295* 259* 289* 306*    Time spent: 35 minutes  Signed:  Berle Mull  [facility] 07/31/2020 6:34 PM

## 2020-08-13 ENCOUNTER — Emergency Department
Admission: EM | Admit: 2020-08-13 | Discharge: 2020-08-13 | Disposition: A | Discharge status: Home / Self Care | Payer: Self-pay | Attending: Emergency Medicine | Admitting: Emergency Medicine

## 2020-08-13 ENCOUNTER — Other Ambulatory Visit: Payer: Self-pay

## 2020-08-13 DIAGNOSIS — Z87891 Personal history of nicotine dependence: Secondary | ICD-10-CM | POA: Insufficient documentation

## 2020-08-13 DIAGNOSIS — Z794 Long term (current) use of insulin: Secondary | ICD-10-CM | POA: Insufficient documentation

## 2020-08-13 DIAGNOSIS — E1143 Type 2 diabetes mellitus with diabetic autonomic (poly)neuropathy: Secondary | ICD-10-CM | POA: Insufficient documentation

## 2020-08-13 DIAGNOSIS — Z79899 Other long term (current) drug therapy: Secondary | ICD-10-CM | POA: Insufficient documentation

## 2020-08-13 DIAGNOSIS — R638 Other symptoms and signs concerning food and fluid intake: Secondary | ICD-10-CM | POA: Insufficient documentation

## 2020-08-13 DIAGNOSIS — E871 Hypo-osmolality and hyponatremia: Secondary | ICD-10-CM | POA: Insufficient documentation

## 2020-08-13 DIAGNOSIS — E11621 Type 2 diabetes mellitus with foot ulcer: Secondary | ICD-10-CM | POA: Insufficient documentation

## 2020-08-13 DIAGNOSIS — N184 Chronic kidney disease, stage 4 (severe): Secondary | ICD-10-CM | POA: Insufficient documentation

## 2020-08-13 DIAGNOSIS — L97509 Non-pressure chronic ulcer of other part of unspecified foot with unspecified severity: Secondary | ICD-10-CM | POA: Insufficient documentation

## 2020-08-13 DIAGNOSIS — Z7982 Long term (current) use of aspirin: Secondary | ICD-10-CM | POA: Insufficient documentation

## 2020-08-13 DIAGNOSIS — Z8616 Personal history of COVID-19: Secondary | ICD-10-CM | POA: Insufficient documentation

## 2020-08-13 DIAGNOSIS — E1122 Type 2 diabetes mellitus with diabetic chronic kidney disease: Secondary | ICD-10-CM | POA: Insufficient documentation

## 2020-08-13 DIAGNOSIS — R531 Weakness: Secondary | ICD-10-CM | POA: Insufficient documentation

## 2020-08-13 LAB — LIPASE, BLOOD: Lipase: 47 U/L (ref 11–51)

## 2020-08-13 LAB — COMPREHENSIVE METABOLIC PANEL
ALT: 27 U/L (ref 0–44)
AST: 13 U/L — ABNORMAL LOW (ref 15–41)
Albumin: 3.4 g/dL — ABNORMAL LOW (ref 3.5–5.0)
Alkaline Phosphatase: 73 U/L (ref 38–126)
Anion gap: 9 (ref 5–15)
BUN: 31 mg/dL — ABNORMAL HIGH (ref 6–20)
CO2: 22 mmol/L (ref 22–32)
Calcium: 8.7 mg/dL — ABNORMAL LOW (ref 8.9–10.3)
Chloride: 97 mmol/L — ABNORMAL LOW (ref 98–111)
Creatinine, Ser: 2.31 mg/dL — ABNORMAL HIGH (ref 0.61–1.24)
GFR, Estimated: 35 mL/min — ABNORMAL LOW (ref >60)
Glucose, Bld: 389 mg/dL — ABNORMAL HIGH (ref 70–99)
Potassium: 4.2 mmol/L (ref 3.5–5.1)
Sodium: 128 mmol/L — ABNORMAL LOW (ref 135–145)
Total Bilirubin: 0.6 mg/dL (ref 0.3–1.2)
Total Protein: 7 g/dL (ref 6.5–8.1)

## 2020-08-13 LAB — CBC
HCT: 24.3 % — ABNORMAL LOW (ref 39.0–52.0)
Hemoglobin: 8.5 g/dL — ABNORMAL LOW (ref 13.0–17.0)
MCH: 31.5 pg (ref 26.0–34.0)
MCHC: 35 g/dL (ref 30.0–36.0)
MCV: 90 fL (ref 80.0–100.0)
Platelets: 440 10*3/uL — ABNORMAL HIGH (ref 150–400)
RBC: 2.7 MIL/uL — ABNORMAL LOW (ref 4.22–5.81)
RDW: 13.5 % (ref 11.5–15.5)
WBC: 6.5 10*3/uL (ref 4.0–10.5)
nRBC: 0 % (ref 0.0–0.2)

## 2020-08-13 MED ORDER — SODIUM CHLORIDE 0.9 % IV BOLUS
1000.0000 mL | Freq: Once | INTRAVENOUS | Status: AC
  Administered 2020-08-13: 1000 mL via INTRAVENOUS

## 2020-08-13 NOTE — ED Notes (Signed)
Upon discharge, patient stated that he did not want to go home. MD states that there is no reason for admission. Upon moving to wheelchair for discharge, patient began to have psuedo seizure. With sternal rub, patient was alert and immediately stopped jerking movement. MD advised that patient is okay to go. Patient advised multiple times that he did not want to go home due to not having a ride. Refused to call a ride. Patient placed in lobby with phone to call a ride, first nurse notified and MD notified of patient behavior.

## 2020-08-13 NOTE — ED Triage Notes (Signed)
Pt comes POV with n/v for 5-6 days and weakness from that.

## 2020-08-13 NOTE — ED Provider Notes (Signed)
[facility] Emergency Department Provider Note   ____________________________________________   I have reviewed the triage vital signs and the nursing notes.   HISTORY  Chief Complaint Weakness, Nausea, and Emesis   History limited by: Not Limited   HPI Gregory Moody is a 44 y.o. male who presents to the emergency department today because of concern for weakness. He states that he has felt weak over the past 3 days. Has had decreased oral intake. States he feels like he almost passed out. Has had diarrhea. Has had similar symptoms in the past.    Records reviewed. Per medical record review patient has a history of multiple hospitalizations for similar symptoms.   Past Medical History:  Diagnosis Date  . COVID-19   . Diabetes mellitus without complication ([place])   . Gastroparesis   . Tuberculosis     Patient Active Problem List   Diagnosis Date Noted  . Sepsis secondary to UTI ([place]) 06/12/2020  . E-coli UTI 06/12/2020  . UTI due to Klebsiella species 06/12/2020  . Hypothermia   . Hyponatremia   . Encephalopathy 06/09/2020  . Neurogenic bladder   . Penile abscess 05/06/2020  . FTT (failure to thrive) in adult 05/03/2020  . Weakness 05/02/2020  . CKD (chronic kidney disease), stage IV ([place]) 05/02/2020  . Type 1 diabetes mellitus with diabetic chronic kidney disease ([place])   . Goals of care, counseling/discussion   . Palliative care by specialist   . Recurrent major depression-severe ([place]) 03/31/2020  . Hyperkalemia   . Iron deficiency anemia   . Weight loss   . Chronic diarrhea 02/07/2020  . Abnormal CT of the chest   . Acute urinary retention 02/06/2020  . Hyperglycemia due to type 2 diabetes mellitus ([place]) 02/06/2020  . History of Clostridium difficile colitis 02/06/2020  . Generalized weakness 02/06/2020  . History of COVID-19 02/06/2020  . Diarrhea 01/21/2020  . Tobacco abuse 01/21/2020  . C. difficile colitis 01/21/2020  .  Urinary retention 01/21/2020  . Diabetes mellitus without complication ([place]) 19/41/7408  . Protein-calorie malnutrition, severe 01/02/2020  . Odynophagia   . DKA (diabetic ketoacidoses) 12/27/2019  . Depression 12/27/2019  . DKA, type 2 ([place]) 12/27/2019  . Anemia of chronic disease 04/25/2019  . Hypomagnesemia 04/25/2019  . Hypotension 04/23/2019  . CAP (community acquired pneumonia) due to MSSA (methicillin sensitive Staphylococcus aureus) ([place]) 04/14/2019  . COVID-19 virus infection 04/14/2019  . Pressure injury of skin 03/31/2019  . Acute renal failure (ARF) ([place]) 03/06/2019  . CAP (community acquired pneumonia) 02/14/2019  . AKI (acute kidney injury) ([place]) 02/02/2019  . ARF (acute renal failure) ([place]) 01/03/2019  . Malnutrition of moderate degree 02/24/2018  . GERD (gastroesophageal reflux disease) 02/23/2018  . Diabetic gastroparesis ([place]) 02/23/2018  . Diabetic foot ulcer ([place]) 02/23/2018  . Aspiration pneumonia ([place]) 04/21/2017  . Hypoglycemia 04/21/2017  . Diabetes mellitus with hyperglycemia ([place]) 04/21/2017  . Hip fracture, unspecified laterality, closed, initial encounter ([place]) 04/17/2017  . Hyperglycemia 04/17/2017  . Malnutrition ([place]) 04/09/2016  . Cavitary lesion of lung 04/06/2016    Past Surgical History:  Procedure Laterality Date  . COLONOSCOPY N/A 01/25/2020   Procedure: COLONOSCOPY;  Surgeon: Toledo, Benay Pike, MD;  Location: [facility] ENDOSCOPY;  Service: Gastroenterology;  Laterality: N/A;  . ESOPHAGOGASTRODUODENOSCOPY N/A 02/03/2019   Procedure: ESOPHAGOGASTRODUODENOSCOPY (EGD);  Surgeon: Toledo, Benay Pike, MD;  Location: [facility] ENDOSCOPY;  Service: Gastroenterology;  Laterality: N/A;  . IR CATHETER TUBE CHANGE  06/13/2020    Prior to Admission medications  Medication Sig Start Date End Date Taking? Authorizing Provider  aspirin EC 81 MG tablet Take 81 mg by mouth daily.    [provider]  benztropine (COGENTIN) 0.5 MG tablet Take 1 tablet (0.5 mg  total) by mouth 2 (two) times daily. 04/09/20   Pokhrel, Corrie Mckusick, MD  colesevelam (WELCHOL) 625 MG tablet Take 3 tablets (1,875 mg total) by mouth 2 (two) times daily with a meal. 07/31/20   Lavina Hamman, MD  DULoxetine (CYMBALTA) 30 MG capsule Take 1 capsule (30 mg total) by mouth daily. 04/10/20   Pokhrel, Corrie Mckusick, MD  feeding supplement, GLUCERNA SHAKE, (GLUCERNA SHAKE) LIQD Take 237 mLs by mouth 3 (three) times daily between meals. 05/13/20   Fritzi Mandes, MD  ferrous sulfate 325 (65 FE) MG EC tablet Take 1 tablet (325 mg total) by mouth 3 (three) times daily with meals. 07/31/20 11/28/20  Lavina Hamman, MD  haloperidol (HALDOL) 0.5 MG tablet Take 1 tablet (0.5 mg total) by mouth 2 (two) times daily. 04/09/20   Pokhrel, Corrie Mckusick, MD  insulin glargine (LANTUS) 100 UNIT/ML injection Inject 0.08 mLs (8 Units total) into the skin daily. 07/31/20   Lavina Hamman, MD  loperamide (IMODIUM) 2 MG capsule Take 1 capsule (2 mg total) by mouth 4 (four) times daily as needed for diarrhea or loose stools. 07/31/20   Lavina Hamman, MD  megestrol (MEGACE) 400 MG/10ML suspension Take 5 mLs (200 mg total) by mouth daily. 08/01/20   Lavina Hamman, MD  Multiple Vitamin (MULTIVITAMIN WITH MINERALS) TABS tablet Take 1 tablet by mouth daily. 05/13/20   Fritzi Mandes, MD  NEXIUM 40 MG capsule Take 1 capsule (40 mg total) by mouth daily. 07/31/20   Lavina Hamman, MD  patiromer (VELTASSA) 8.4 g packet Take 1 packet (8.4 g total) by mouth daily. 07/31/20   Lavina Hamman, MD  sodium bicarbonate 650 MG tablet Take 2 tablets (1,300 mg total) by mouth 2 (two) times daily. 07/31/20   Lavina Hamman, MD    Allergies Patient has no known allergies.  Family History  Problem Relation Age of Onset  . Diabetes Mother   . Diabetes Father     Social History Social History   Tobacco Use  . Smoking status: Former Smoker    Types: Cigars, Cigarettes  . Smokeless tobacco: Never Used  Vaping Use  . Vaping Use: Never used   Substance Use Topics  . Alcohol use: No  . Drug use: No    Review of Systems Constitutional: Positive for generalized weakness. Eyes: No visual changes. ENT: No sore throat. Cardiovascular: Denies chest pain. Respiratory: Denies shortness of breath. Gastrointestinal: Positive for diarrhea and decreased oral intake.  Genitourinary: Negative for dysuria. Musculoskeletal: Negative for back pain. Skin: Negative for rash. Neurological: Negative for headaches, focal weakness or numbness.  ____________________________________________   PHYSICAL EXAM:  VITAL SIGNS: ED Triage Vitals  Enc Vitals Group     BP 08/13/20 1604 (!) 62/39     Pulse Rate 08/13/20 1604 69     Resp 08/13/20 1604 18     Temp 08/13/20 1604 98 F (36.7 C)     Temp Source 08/13/20 1604 Oral     SpO2 08/13/20 1604 100 %     Weight 08/13/20 1602 97 lb (44 kg)     Height 08/13/20 1602 5\' 8"  (1.727 m)     Head Circumference --      Peak Flow --      Pain Score  08/13/20 1602 10    Constitutional: Alert and oriented.  Eyes: Conjunctivae are normal.  ENT      Head: Normocephalic and atraumatic.      Nose: No congestion/rhinnorhea.      Mouth/Throat: Mucous membranes are moist.      Neck: No stridor. Hematological/Lymphatic/Immunilogical: No cervical lymphadenopathy. Cardiovascular: Normal rate, regular rhythm.  No murmurs, rubs, or gallops.  Respiratory: Normal respiratory effort without tachypnea nor retractions. Breath sounds are clear and equal bilaterally. No wheezes/rales/rhonchi. Gastrointestinal: Soft and non tender. No rebound. No guarding.  Genitourinary: Deferred Musculoskeletal: Normal range of motion in all extremities. No lower extremity edema. Neurologic:  Normal speech and language. No gross focal neurologic deficits are appreciated.  Skin:  Skin is warm, dry and intact. No rash noted. Psychiatric: Mood and affect are normal. Speech and behavior are normal. Patient exhibits appropriate insight  and judgment.  ____________________________________________    LABS (pertinent positives/negatives)  Lipase 47 CBC wbc 6.5, hgb 8.5, plt 440 CMP na 128, k 4.2, glu 389, cr 2.31 ____________________________________________   EKG  I, Nance Pear, attending physician, personally viewed and interpreted this EKG  EKG Time: 1654 Rate: 68 Rhythm: sinus rhythm Axis: normal Intervals: qtc 429 QRS: narrow ST changes: no st elevation Impression: normal ekg   ____________________________________________    RADIOLOGY  None  ____________________________________________   PROCEDURES  Procedures  ____________________________________________   INITIAL IMPRESSION / ASSESSMENT AND PLAN / ED COURSE  Pertinent labs & imaging results that were available during my care of the patient were reviewed by me and considered in my medical decision making (see chart for details).   Patient presented to the emergency department today because of concerns for weakness and decreased oral intake.  Patient had initial low blood pressure however this did improve with IV fluids.  Per chart review the patient has had multiple similar presentations and admissions in the hospital.  Patient's blood work today is fairly comparable to his chronic blood work with elevated blood sugars, hyponatremia, slight elevation of creatinine.  At this time given chronic condition do not feel patient would necessarily benefit from repeat hospitalizations. Will plan on discharging home for patient to continue outpatient follow up.  ____________________________________________   FINAL CLINICAL IMPRESSION(S) / ED DIAGNOSES  Final diagnoses:  Weakness  Poor fluid intake  Chronic hyponatremia     Note: This dictation was prepared with Dragon dictation. Any transcriptional errors that result from this process are unintentional     Nance Pear, MD 08/13/20 [phone]

## 2020-08-13 NOTE — Discharge Instructions (Addendum)
Please seek medical attention for any high fevers, chest pain, shortness of breath, change in behavior, persistent vomiting, bloody stool or any other new or concerning symptoms.  

## 2020-08-13 NOTE — ED Notes (Signed)
Patient refused to sign discharge paperwork because he does not want to go home.

## 2020-08-18 ENCOUNTER — Emergency Department: Payer: MEDICAID

## 2020-08-18 ENCOUNTER — Observation Stay: Payer: MEDICAID

## 2020-08-18 ENCOUNTER — Ambulatory Visit (INDEPENDENT_AMBULATORY_CARE_PROVIDER_SITE_OTHER): Payer: Self-pay | Admitting: Physician Assistant

## 2020-08-18 ENCOUNTER — Inpatient Hospital Stay
Admission: EM | Admit: 2020-08-18 | Discharge: 2020-08-23 | DRG: 682 | Disposition: A | Discharge status: Home / Self Care | Payer: MEDICAID | Attending: Internal Medicine | Admitting: Internal Medicine

## 2020-08-18 ENCOUNTER — Other Ambulatory Visit: Payer: Self-pay

## 2020-08-18 VITALS — Ht 68.0 in | Wt 110.0 lb

## 2020-08-18 DIAGNOSIS — R627 Adult failure to thrive: Secondary | ICD-10-CM | POA: Diagnosis present

## 2020-08-18 DIAGNOSIS — E860 Dehydration: Secondary | ICD-10-CM | POA: Diagnosis present

## 2020-08-18 DIAGNOSIS — D631 Anemia in chronic kidney disease: Secondary | ICD-10-CM | POA: Diagnosis present

## 2020-08-18 DIAGNOSIS — N179 Acute kidney failure, unspecified: Principal | ICD-10-CM | POA: Diagnosis present

## 2020-08-18 DIAGNOSIS — Z87440 Personal history of urinary (tract) infections: Secondary | ICD-10-CM

## 2020-08-18 DIAGNOSIS — Z8619 Personal history of other infectious and parasitic diseases: Secondary | ICD-10-CM

## 2020-08-18 DIAGNOSIS — Z20822 Contact with and (suspected) exposure to covid-19: Secondary | ICD-10-CM | POA: Diagnosis present

## 2020-08-18 DIAGNOSIS — E872 Acidosis, unspecified: Secondary | ICD-10-CM

## 2020-08-18 DIAGNOSIS — D638 Anemia in other chronic diseases classified elsewhere: Secondary | ICD-10-CM | POA: Diagnosis present

## 2020-08-18 DIAGNOSIS — E861 Hypovolemia: Secondary | ICD-10-CM

## 2020-08-18 DIAGNOSIS — M19079 Primary osteoarthritis, unspecified ankle and foot: Secondary | ICD-10-CM | POA: Diagnosis present

## 2020-08-18 DIAGNOSIS — Z8616 Personal history of COVID-19: Secondary | ICD-10-CM

## 2020-08-18 DIAGNOSIS — F32A Depression, unspecified: Secondary | ICD-10-CM | POA: Diagnosis present

## 2020-08-18 DIAGNOSIS — E43 Unspecified severe protein-calorie malnutrition: Secondary | ICD-10-CM | POA: Diagnosis present

## 2020-08-18 DIAGNOSIS — L89010 Pressure ulcer of right elbow, unstageable: Secondary | ICD-10-CM | POA: Diagnosis present

## 2020-08-18 DIAGNOSIS — E1122 Type 2 diabetes mellitus with diabetic chronic kidney disease: Secondary | ICD-10-CM | POA: Diagnosis present

## 2020-08-18 DIAGNOSIS — N1832 Chronic kidney disease, stage 3b: Secondary | ICD-10-CM | POA: Diagnosis present

## 2020-08-18 DIAGNOSIS — M79661 Pain in right lower leg: Secondary | ICD-10-CM

## 2020-08-18 DIAGNOSIS — Z833 Family history of diabetes mellitus: Secondary | ICD-10-CM

## 2020-08-18 DIAGNOSIS — E119 Type 2 diabetes mellitus without complications: Secondary | ICD-10-CM

## 2020-08-18 DIAGNOSIS — K219 Gastro-esophageal reflux disease without esophagitis: Secondary | ICD-10-CM | POA: Diagnosis present

## 2020-08-18 DIAGNOSIS — M79671 Pain in right foot: Secondary | ICD-10-CM | POA: Diagnosis present

## 2020-08-18 DIAGNOSIS — E1151 Type 2 diabetes mellitus with diabetic peripheral angiopathy without gangrene: Secondary | ICD-10-CM | POA: Diagnosis present

## 2020-08-18 DIAGNOSIS — K3184 Gastroparesis: Secondary | ICD-10-CM | POA: Diagnosis present

## 2020-08-18 DIAGNOSIS — B965 Pseudomonas (aeruginosa) (mallei) (pseudomallei) as the cause of diseases classified elsewhere: Secondary | ICD-10-CM | POA: Diagnosis present

## 2020-08-18 DIAGNOSIS — E46 Unspecified protein-calorie malnutrition: Secondary | ICD-10-CM | POA: Diagnosis present

## 2020-08-18 DIAGNOSIS — R739 Hyperglycemia, unspecified: Secondary | ICD-10-CM

## 2020-08-18 DIAGNOSIS — Z8611 Personal history of tuberculosis: Secondary | ICD-10-CM

## 2020-08-18 DIAGNOSIS — N319 Neuromuscular dysfunction of bladder, unspecified: Secondary | ICD-10-CM

## 2020-08-18 DIAGNOSIS — Z9359 Other cystostomy status: Secondary | ICD-10-CM

## 2020-08-18 DIAGNOSIS — K529 Noninfective gastroenteritis and colitis, unspecified: Secondary | ICD-10-CM | POA: Diagnosis present

## 2020-08-18 DIAGNOSIS — E1143 Type 2 diabetes mellitus with diabetic autonomic (poly)neuropathy: Secondary | ICD-10-CM | POA: Diagnosis present

## 2020-08-18 DIAGNOSIS — Z7982 Long term (current) use of aspirin: Secondary | ICD-10-CM

## 2020-08-18 DIAGNOSIS — Z9111 Patient's noncompliance with dietary regimen: Secondary | ICD-10-CM

## 2020-08-18 DIAGNOSIS — Z79899 Other long term (current) drug therapy: Secondary | ICD-10-CM

## 2020-08-18 DIAGNOSIS — E871 Hypo-osmolality and hyponatremia: Secondary | ICD-10-CM | POA: Diagnosis present

## 2020-08-18 DIAGNOSIS — I9589 Other hypotension: Secondary | ICD-10-CM

## 2020-08-18 DIAGNOSIS — Z87891 Personal history of nicotine dependence: Secondary | ICD-10-CM

## 2020-08-18 DIAGNOSIS — N390 Urinary tract infection, site not specified: Secondary | ICD-10-CM

## 2020-08-18 DIAGNOSIS — L89899 Pressure ulcer of other site, unspecified stage: Secondary | ICD-10-CM | POA: Diagnosis present

## 2020-08-18 DIAGNOSIS — I959 Hypotension, unspecified: Secondary | ICD-10-CM | POA: Diagnosis present

## 2020-08-18 DIAGNOSIS — R531 Weakness: Secondary | ICD-10-CM

## 2020-08-18 DIAGNOSIS — Z794 Long term (current) use of insulin: Secondary | ICD-10-CM

## 2020-08-18 DIAGNOSIS — Z8701 Personal history of pneumonia (recurrent): Secondary | ICD-10-CM

## 2020-08-18 DIAGNOSIS — L89020 Pressure ulcer of left elbow, unstageable: Secondary | ICD-10-CM | POA: Diagnosis present

## 2020-08-18 DIAGNOSIS — Z681 Body mass index (BMI) 19 or less, adult: Secondary | ICD-10-CM

## 2020-08-18 DIAGNOSIS — R338 Other retention of urine: Secondary | ICD-10-CM | POA: Diagnosis present

## 2020-08-18 DIAGNOSIS — E1165 Type 2 diabetes mellitus with hyperglycemia: Secondary | ICD-10-CM | POA: Diagnosis present

## 2020-08-18 LAB — BLOOD GAS, VENOUS
Acid-base deficit: 4.9 mmol/L — ABNORMAL HIGH (ref 0.0–2.0)
Bicarbonate: 21.6 mmol/L (ref 20.0–28.0)
O2 Saturation: 18.4 %
Patient temperature: 37
pCO2, Ven: 46 mmHg (ref 44.0–60.0)
pH, Ven: 7.28 (ref 7.250–7.430)
pO2, Ven: <31 mmHg — CL (ref 32.0–45.0)

## 2020-08-18 LAB — URINALYSIS, COMPLETE (UACMP) WITH MICROSCOPIC
Bilirubin Urine: NEGATIVE
Glucose, UA: >=500 mg/dL — AB
Ketones, ur: NEGATIVE mg/dL
Nitrite: POSITIVE — AB
Protein, ur: 100 mg/dL — AB
Specific Gravity, Urine: 1.015 (ref 1.005–1.030)
Squamous Epithelial / HPF: NONE SEEN (ref 0–5)
pH: 5 (ref 5.0–8.0)

## 2020-08-18 LAB — COMPREHENSIVE METABOLIC PANEL
ALT: 14 U/L (ref 0–44)
AST: 12 U/L — ABNORMAL LOW (ref 15–41)
Albumin: 3.2 g/dL — ABNORMAL LOW (ref 3.5–5.0)
Alkaline Phosphatase: 74 U/L (ref 38–126)
Anion gap: 11 (ref 5–15)
BUN: 36 mg/dL — ABNORMAL HIGH (ref 6–20)
CO2: 18 mmol/L — ABNORMAL LOW (ref 22–32)
Calcium: 8.6 mg/dL — ABNORMAL LOW (ref 8.9–10.3)
Chloride: 96 mmol/L — ABNORMAL LOW (ref 98–111)
Creatinine, Ser: 2.69 mg/dL — ABNORMAL HIGH (ref 0.61–1.24)
GFR, Estimated: 29 mL/min — ABNORMAL LOW (ref >60)
Glucose, Bld: 624 mg/dL (ref 70–99)
Potassium: 4.8 mmol/L (ref 3.5–5.1)
Sodium: 125 mmol/L — ABNORMAL LOW (ref 135–145)
Total Bilirubin: 0.5 mg/dL (ref 0.3–1.2)
Total Protein: 6.9 g/dL (ref 6.5–8.1)

## 2020-08-18 LAB — CBC WITH DIFFERENTIAL/PLATELET
Abs Immature Granulocytes: 0.02 10*3/uL (ref 0.00–0.07)
Basophils Absolute: 0 10*3/uL (ref 0.0–0.1)
Basophils Relative: 0 %
Eosinophils Absolute: 0 10*3/uL (ref 0.0–0.5)
Eosinophils Relative: 0 %
HCT: 26.7 % — ABNORMAL LOW (ref 39.0–52.0)
Hemoglobin: 8.4 g/dL — ABNORMAL LOW (ref 13.0–17.0)
Immature Granulocytes: 0 %
Lymphocytes Relative: 23 %
Lymphs Abs: 1.5 10*3/uL (ref 0.7–4.0)
MCH: 31.1 pg (ref 26.0–34.0)
MCHC: 31.5 g/dL (ref 30.0–36.0)
MCV: 98.9 fL (ref 80.0–100.0)
Monocytes Absolute: 0.3 10*3/uL (ref 0.1–1.0)
Monocytes Relative: 5 %
Neutro Abs: 4.5 10*3/uL (ref 1.7–7.7)
Neutrophils Relative %: 72 %
Platelets: 379 10*3/uL (ref 150–400)
RBC: 2.7 MIL/uL — ABNORMAL LOW (ref 4.22–5.81)
RDW: 14 % (ref 11.5–15.5)
WBC: 6.3 10*3/uL (ref 4.0–10.5)
nRBC: 0 % (ref 0.0–0.2)

## 2020-08-18 LAB — PHOSPHORUS: Phosphorus: 2.5 mg/dL (ref 2.5–4.6)

## 2020-08-18 LAB — TROPONIN I (HIGH SENSITIVITY)
Troponin I (High Sensitivity): 3 ng/L (ref <18)
Troponin I (High Sensitivity): 3 ng/L (ref <18)

## 2020-08-18 LAB — CBG MONITORING, ED
Glucose-Capillary: 553 mg/dL (ref 70–99)
Glucose-Capillary: 402 mg/dL — ABNORMAL HIGH (ref 70–99)
Glucose-Capillary: 335 mg/dL — ABNORMAL HIGH (ref 70–99)
Glucose-Capillary: 482 mg/dL — ABNORMAL HIGH (ref 70–99)

## 2020-08-18 LAB — TSH: TSH: 0.604 u[IU]/mL (ref 0.350–4.500)

## 2020-08-18 LAB — LIPASE, BLOOD: Lipase: 58 U/L — ABNORMAL HIGH (ref 11–51)

## 2020-08-18 LAB — RESPIRATORY PANEL BY RT PCR (FLU A&B, COVID)
Influenza A by PCR: NEGATIVE
Influenza B by PCR: NEGATIVE
SARS Coronavirus 2 by RT PCR: NEGATIVE

## 2020-08-18 LAB — LACTIC ACID, PLASMA
Lactic Acid, Venous: 3.4 mmol/L (ref 0.5–1.9)
Lactic Acid, Venous: 2.4 mmol/L (ref 0.5–1.9)

## 2020-08-18 LAB — OSMOLALITY: Osmolality: 310 mOsm/kg — ABNORMAL HIGH (ref 275–295)

## 2020-08-18 IMAGING — DX DG CHEST 1V PORT
1 series · 1 of 1 positions shown · non-contrast
Comparison: None.

CLINICAL DATA: Right foot pain starting 5 days ago.

EXAM:
PORTABLE CHEST 1 VIEW

[chest ap]
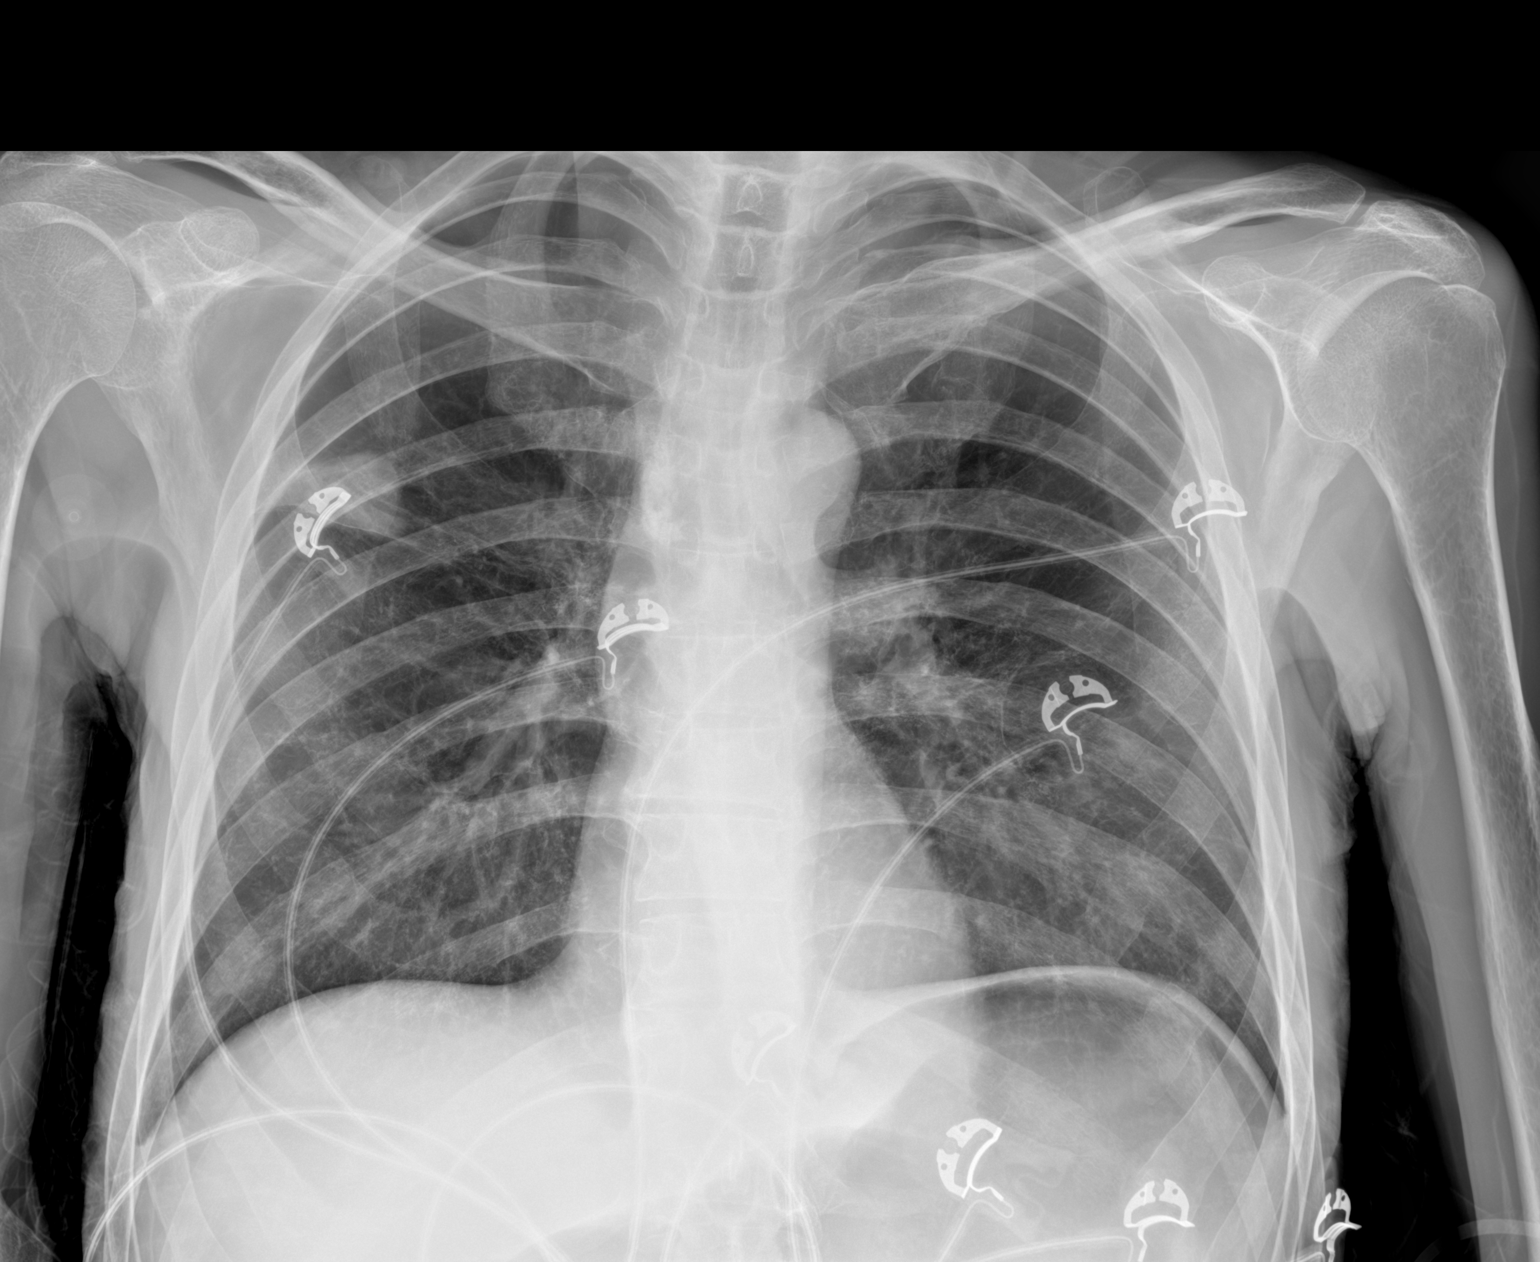

[1 of 1 positions shown; findings below may reference images not displayed]

FINDINGS: Right nodular opacity peripherally along the minor fissure is stable
and was evaluated by CT on [DATE] as likely scarring related. No
focal consolidation in the left lung. No pneumothorax or pleural
effusion. The cardiopericardial silhouette is within normal limits
for size. The visualized bony structures of the thorax show no acute
abnormality. Telemetry leads overlie the chest.
IMPRESSION: Stable.  No new or acute interval findings.

## 2020-08-18 IMAGING — DX DG FOOT COMPLETE 3+V*R*
3 series · 3 of 3 positions shown · non-contrast
Comparison: [DATE]

CLINICAL DATA: Right foot pain for 5 days, inability to ambulate

EXAM:
RIGHT FOOT COMPLETE - 3+ VIEW

[foot ap]
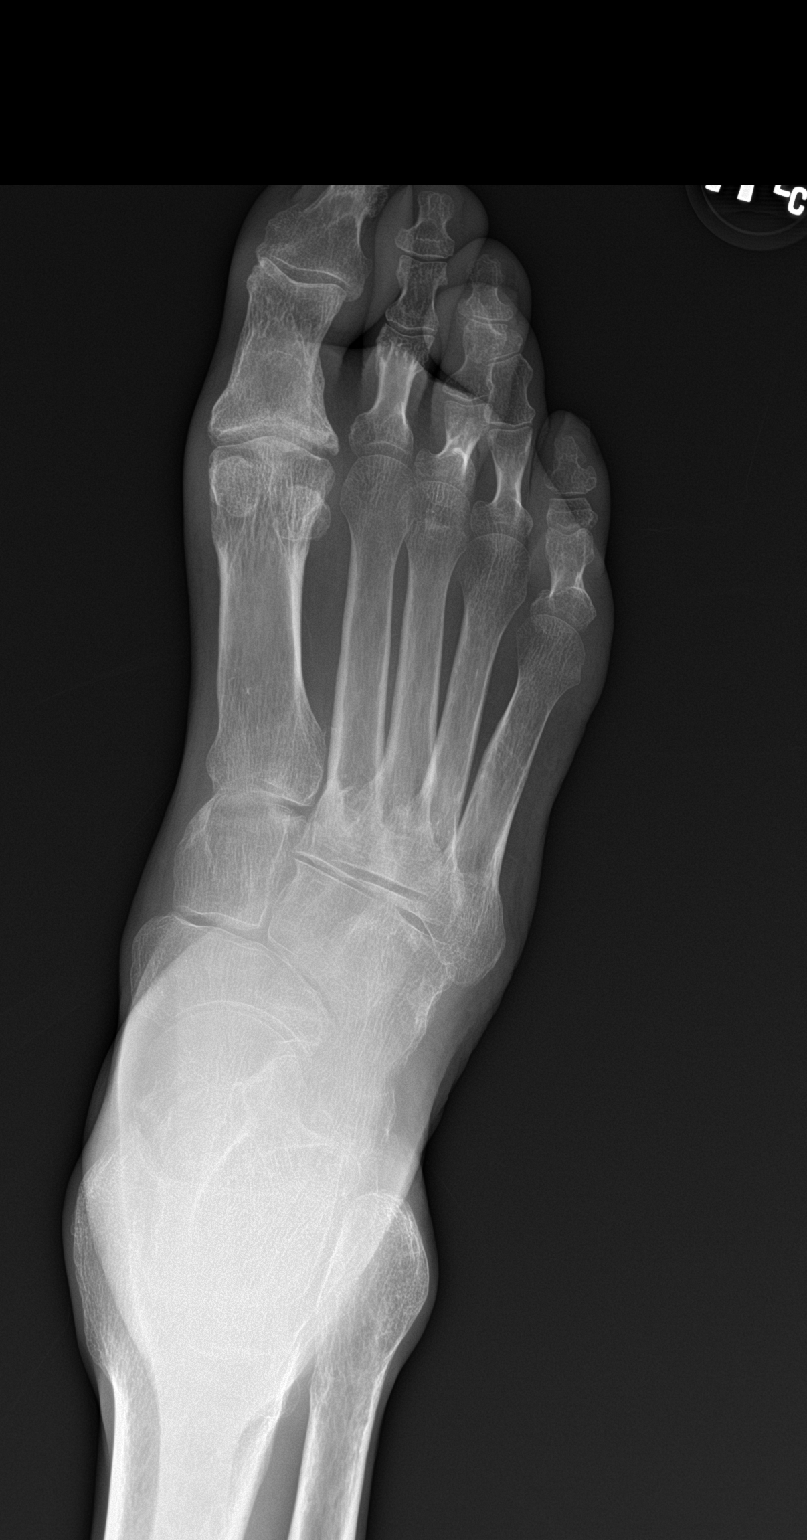

[foot obl]
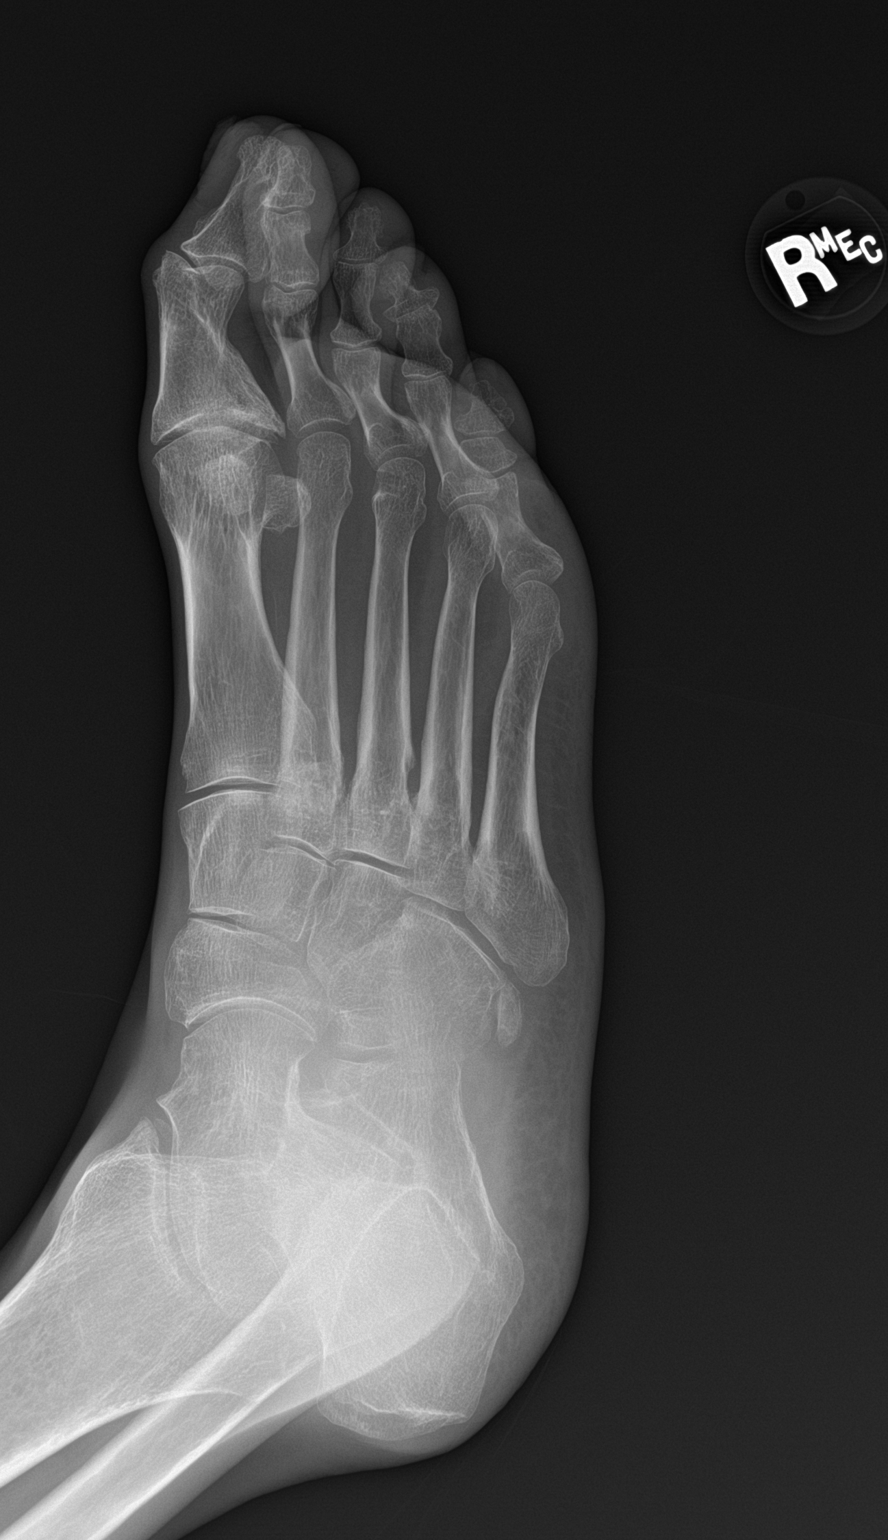

[foot lat]
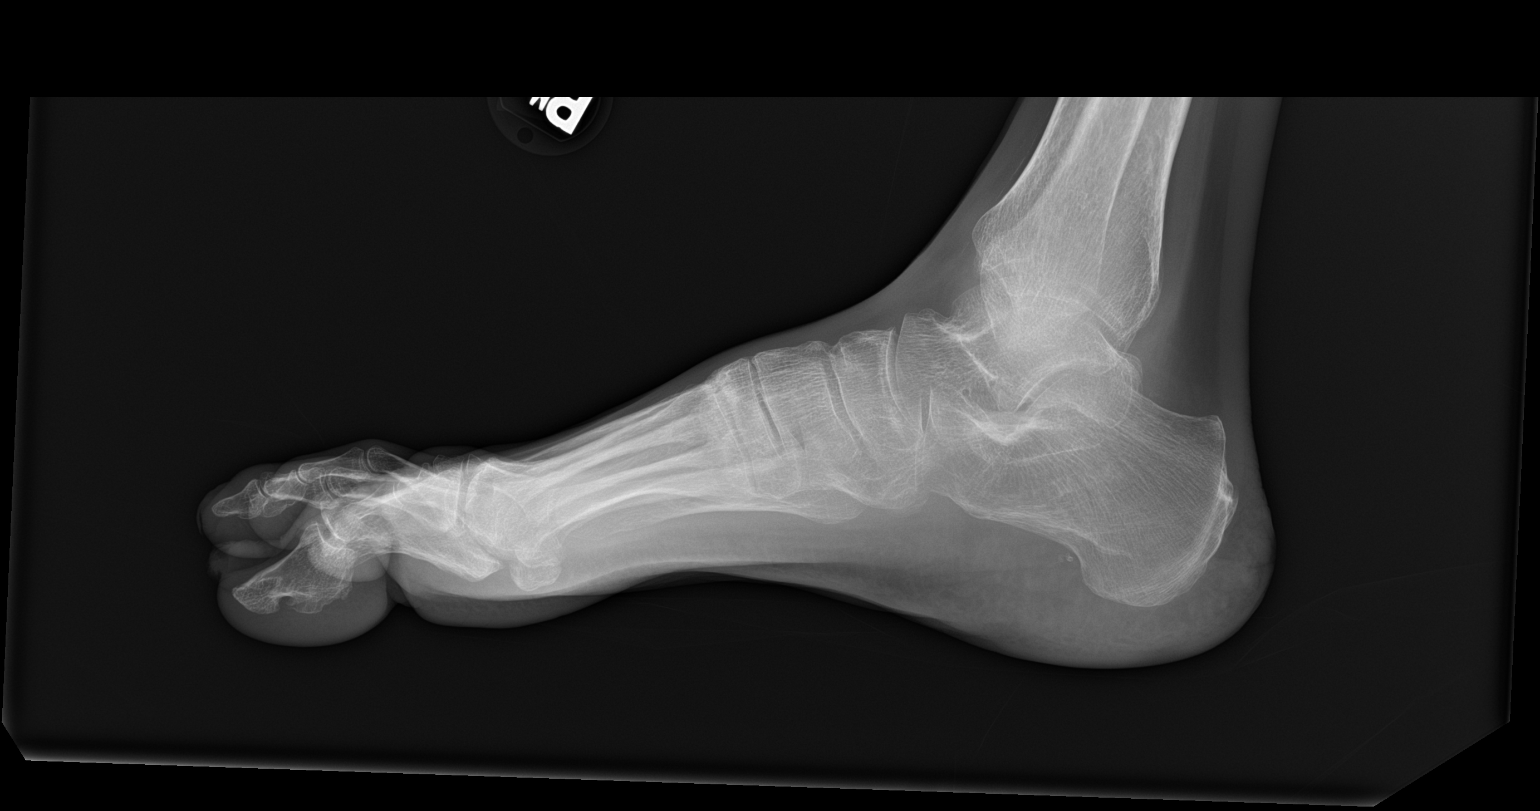

[3 of 3 positions shown; findings below may reference images not displayed]

FINDINGS: Frontal, oblique, and lateral views of the right foot are obtained.
There is subtle sclerosis at the base of the second metatarsal. This
could reflect stress reaction. No displaced fractures. Moderate
osteoarthritis of the first metatarsophalangeal joint. Mild joint
space narrowing and osteophyte formation within the midfoot. Soft
tissues are unremarkable.
IMPRESSION: 1. Sclerosis at the base of the second metatarsal which could
reflect stress fracture. Follow-up outpatient MRI or three-phase
bone scan could be performed if clinical symptoms exist in this
region.
2. Osteoarthritis, greatest at the first metatarsophalangeal joint.
3. No displaced fractures.

## 2020-08-18 IMAGING — US US EXTREM LOW VENOUS
1 series · 14 of 24 positions shown · non-contrast
Comparison: None.

CLINICAL DATA: Pain swelling

EXAM:
BILATERAL LOWER EXTREMITY VENOUS DOPPLER ULTRASOUND
TECHNIQUE: Gray-scale sonography with compression, as well as color and duplex
ultrasound, were performed to evaluate the deep venous system(s)
from the level of the common femoral vein through the popliteal and
proximal calf veins.

[Series 1: us venous img lower bilat (dvt) · portal-venous · 14 of 52 slices shown]
[im 1/52]
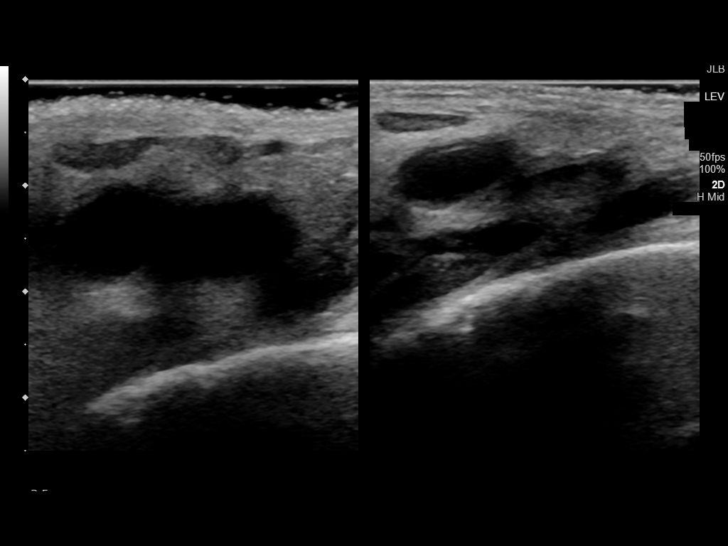
[im 5/52]
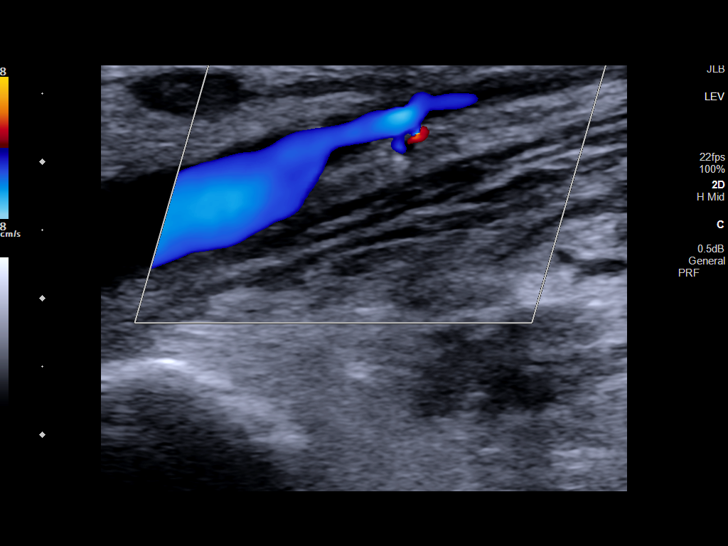
[im 9/52]
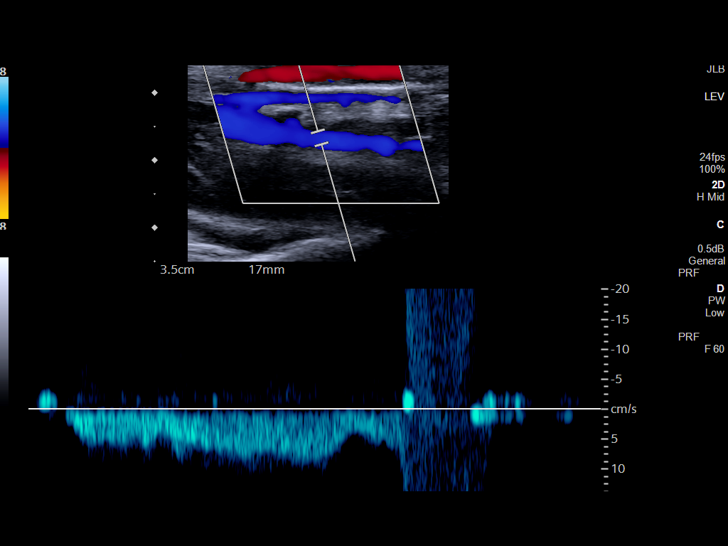
[im 14/52]
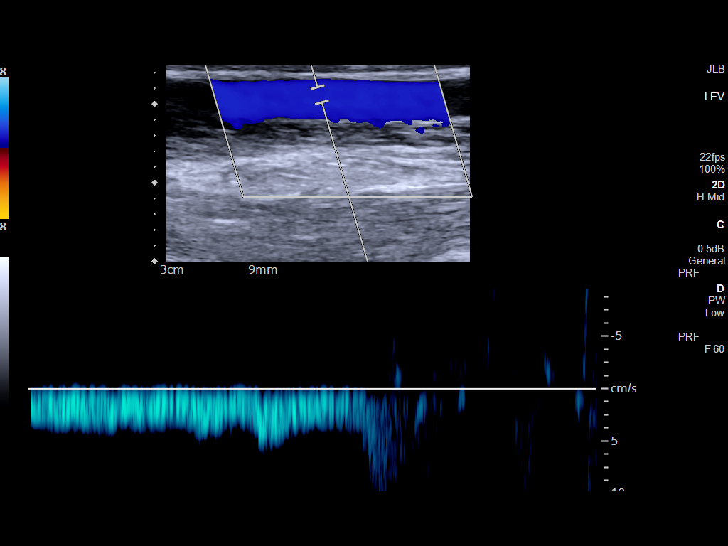
[im 16/52]
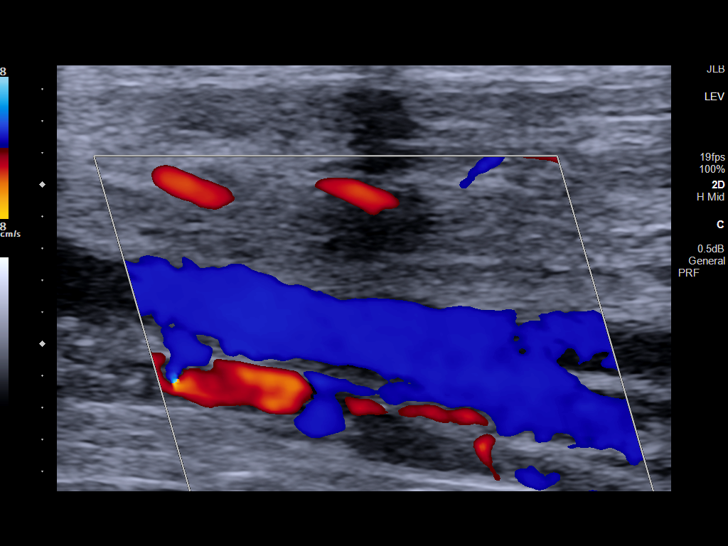
[im 20/52]
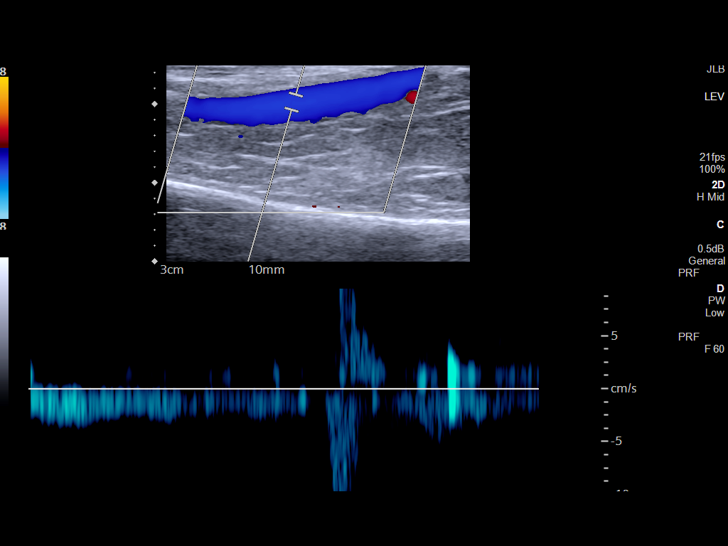
[im 25/52]
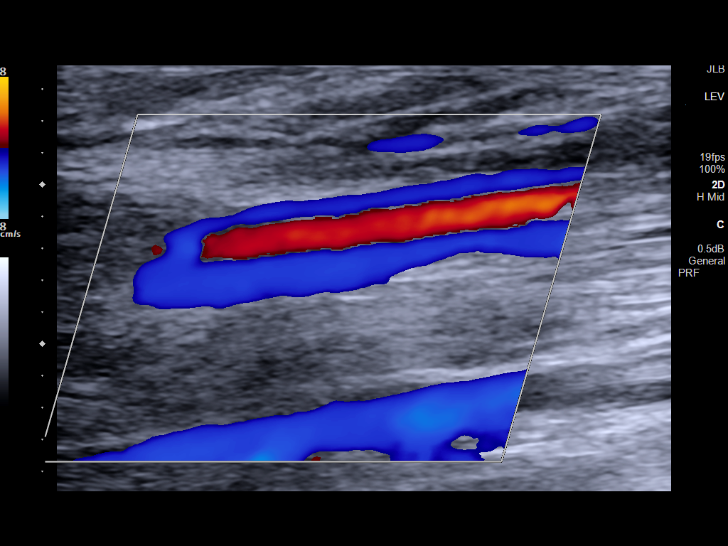
[im 27/52]
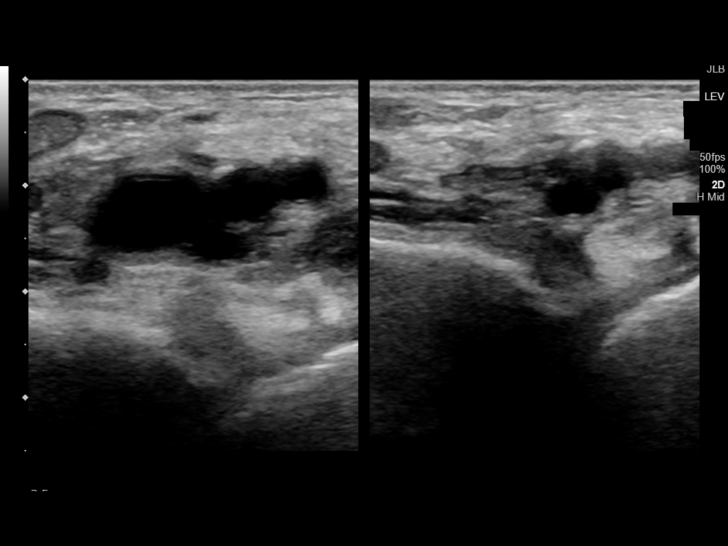
[im 32/52]
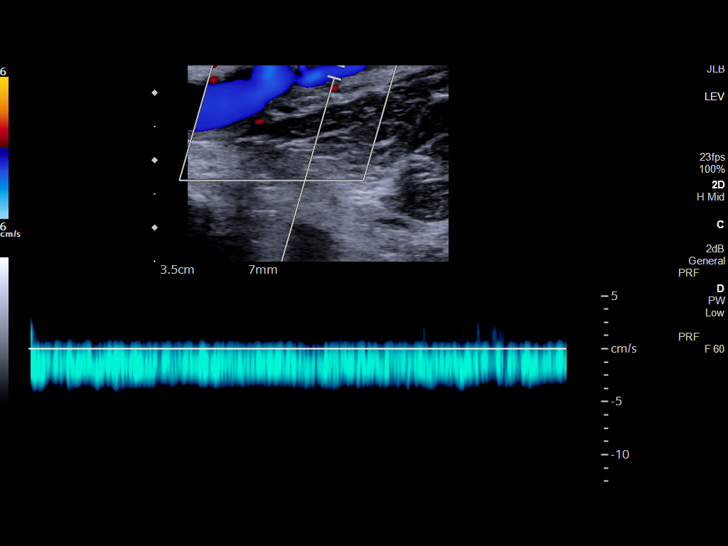
[im 36/52]
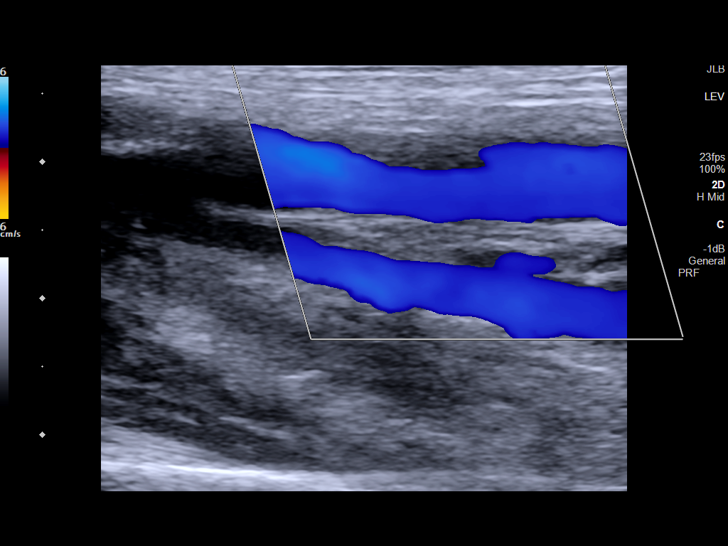
[im 40/52]
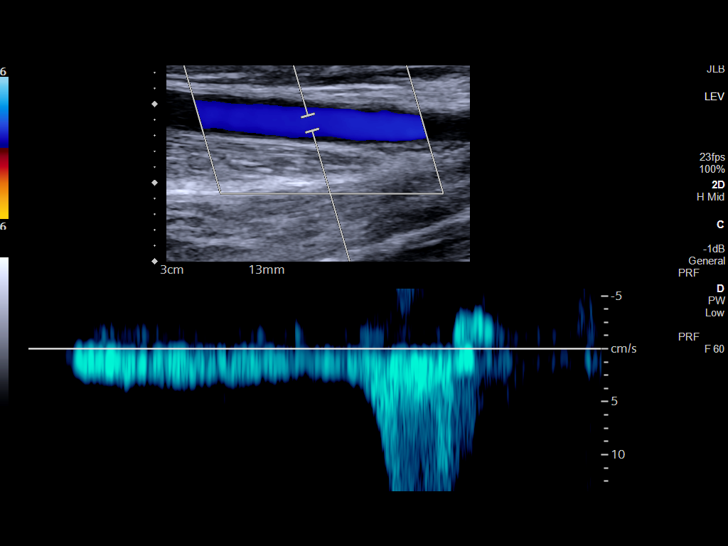
[im 43/52]
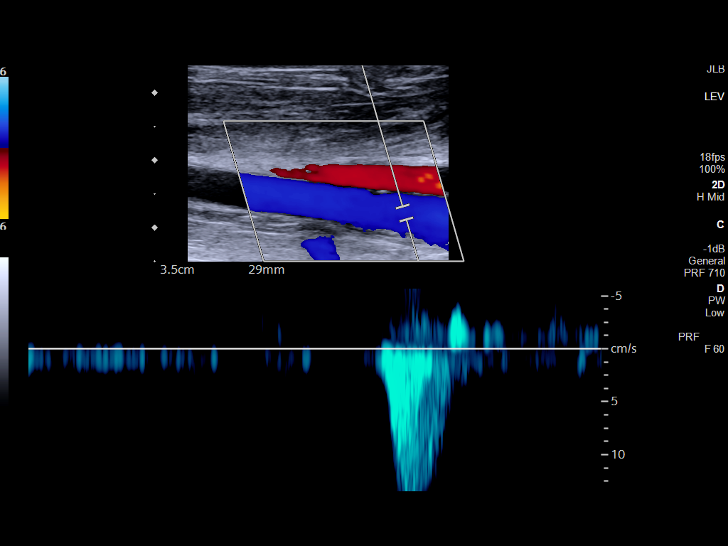
[im 47/52]
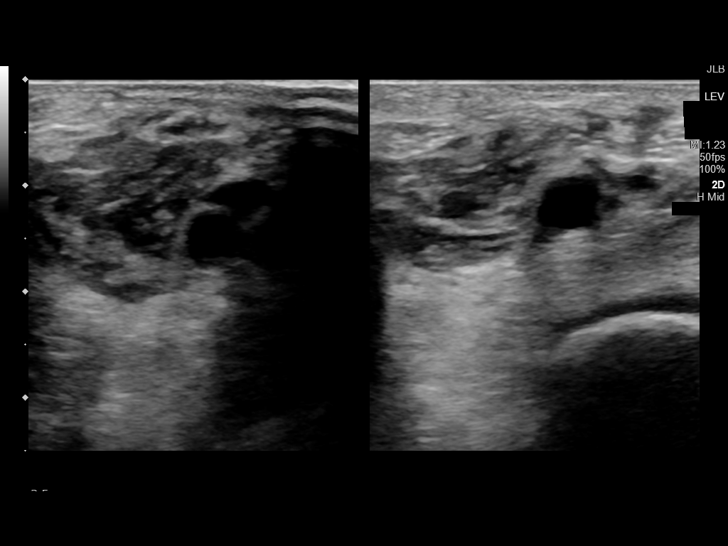
[im 52/52]
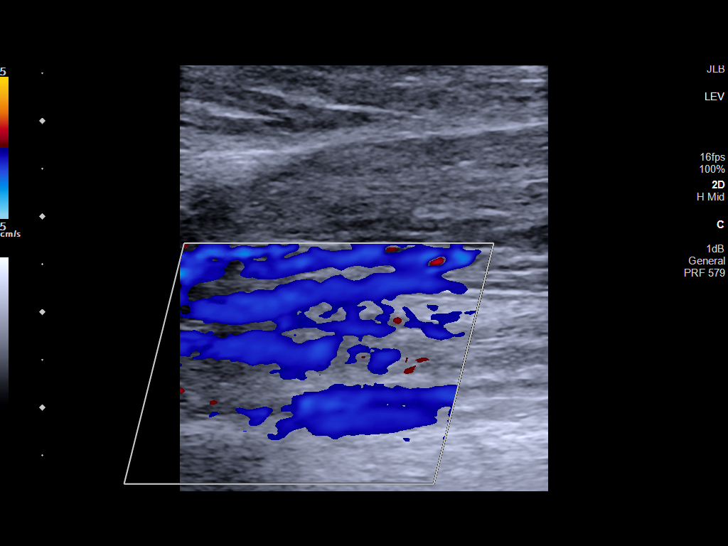

[14 of 24 positions shown; findings below may reference images not displayed]

FINDINGS: VENOUS

Normal compressibility of the common femoral, superficial femoral,
and popliteal veins, as well as the visualized calf veins.
Visualized portions of profunda femoral vein and great saphenous
vein unremarkable. No filling defects to suggest DVT on grayscale or
color Doppler imaging. Doppler waveforms show normal direction of
venous flow, normal respiratory plasticity and response to
augmentation.

OTHER

None.

Limitations: none
IMPRESSION: Negative.

## 2020-08-18 MED ORDER — SODIUM CHLORIDE 0.9 % IV SOLN: INTRAVENOUS | Status: DC

## 2020-08-18 MED ORDER — ADULT MULTIVITAMIN W/MINERALS CH
1.0000 | ORAL_TABLET | Freq: Every day | ORAL | Status: DC
  Administered 2020-08-19 – 2020-08-23 (×5): 1 via ORAL
  Filled 2020-08-18 (×5): qty 1

## 2020-08-18 MED ORDER — INSULIN GLARGINE 100 UNIT/ML ~~LOC~~ SOLN
8.0000 [IU] | Freq: Every day | SUBCUTANEOUS | Status: DC
  Administered 2020-08-18 – 2020-08-22 (×5): 8 [IU] via SUBCUTANEOUS
  Filled 2020-08-18 (×5): qty 0.08

## 2020-08-18 MED ORDER — HEPARIN SODIUM (PORCINE) 5000 UNIT/ML IJ SOLN
5000.0000 [IU] | Freq: Three times a day (TID) | INTRAMUSCULAR | Status: DC
  Administered 2020-08-18 – 2020-08-23 (×12): 5000 [IU] via SUBCUTANEOUS
  Filled 2020-08-18 (×12): qty 1

## 2020-08-18 MED ORDER — INSULIN ASPART 100 UNIT/ML ~~LOC~~ SOLN
10.0000 [IU] | Freq: Once | SUBCUTANEOUS | Status: AC
  Administered 2020-08-18: 10 [IU] via INTRAVENOUS
  Filled 2020-08-18: qty 1

## 2020-08-18 MED ORDER — SODIUM CHLORIDE 0.9 % IV SOLN: INTRAVENOUS | Status: AC

## 2020-08-18 MED ORDER — MEGESTROL ACETATE 400 MG/10ML PO SUSP
200.0000 mg | Freq: Every day | ORAL | Status: DC
  Administered 2020-08-19 – 2020-08-23 (×6): 200 mg via ORAL
  Filled 2020-08-18 (×7): qty 10

## 2020-08-18 MED ORDER — INSULIN ASPART 100 UNIT/ML ~~LOC~~ SOLN
0.0000 [IU] | Freq: Three times a day (TID) | SUBCUTANEOUS | Status: DC
  Administered 2020-08-19 (×2): 2 [IU] via SUBCUTANEOUS
  Administered 2020-08-19 – 2020-08-20 (×3): 1 [IU] via SUBCUTANEOUS
  Administered 2020-08-20 – 2020-08-21 (×5): 2 [IU] via SUBCUTANEOUS
  Administered 2020-08-22: 4 [IU] via SUBCUTANEOUS
  Administered 2020-08-22: 2 [IU] via SUBCUTANEOUS
  Administered 2020-08-22: 1 [IU] via SUBCUTANEOUS
  Administered 2020-08-22: 4 [IU] via SUBCUTANEOUS
  Administered 2020-08-23 (×2): 3 [IU] via SUBCUTANEOUS
  Filled 2020-08-18 (×17): qty 1

## 2020-08-18 MED ORDER — INSULIN ASPART 100 UNIT/ML ~~LOC~~ SOLN: 0.0000 [IU] | Freq: Three times a day (TID) | SUBCUTANEOUS | Status: DC

## 2020-08-18 MED ORDER — SODIUM CHLORIDE 0.9 % IV BOLUS
1000.0000 mL | Freq: Once | INTRAVENOUS | Status: AC
  Administered 2020-08-18: 1000 mL via INTRAVENOUS

## 2020-08-18 MED ORDER — HALOPERIDOL 0.5 MG PO TABS
0.5000 mg | ORAL_TABLET | Freq: Two times a day (BID) | ORAL | Status: DC
  Administered 2020-08-19 – 2020-08-23 (×10): 0.5 mg via ORAL
  Filled 2020-08-18 (×11): qty 1

## 2020-08-18 MED ORDER — POTASSIUM & SODIUM PHOSPHATES 280-160-250 MG PO PACK
1.0000 | PACK | Freq: Three times a day (TID) | ORAL | Status: DC
  Administered 2020-08-18 – 2020-08-19 (×2): 1 via ORAL
  Filled 2020-08-18 (×6): qty 1

## 2020-08-18 MED ORDER — INSULIN ASPART 100 UNIT/ML ~~LOC~~ SOLN
8.0000 [IU] | Freq: Once | SUBCUTANEOUS | Status: AC
  Administered 2020-08-18: 8 [IU] via SUBCUTANEOUS
  Filled 2020-08-18: qty 1

## 2020-08-18 MED ORDER — DULOXETINE HCL 30 MG PO CPEP
30.0000 mg | ORAL_CAPSULE | Freq: Every day | ORAL | Status: DC
  Administered 2020-08-18 – 2020-08-23 (×6): 30 mg via ORAL
  Filled 2020-08-18 (×6): qty 1

## 2020-08-18 MED ORDER — PANTOPRAZOLE SODIUM 40 MG PO TBEC
80.0000 mg | DELAYED_RELEASE_TABLET | Freq: Every day | ORAL | Status: DC
  Administered 2020-08-19 – 2020-08-23 (×5): 80 mg via ORAL
  Filled 2020-08-18 (×5): qty 2

## 2020-08-18 MED ORDER — MIDODRINE HCL 5 MG PO TABS
2.5000 mg | ORAL_TABLET | Freq: Three times a day (TID) | ORAL | Status: DC | PRN
  Administered 2020-08-19: 2.5 mg via ORAL
  Filled 2020-08-18 (×3): qty 0.5

## 2020-08-18 MED ORDER — FERROUS SULFATE 325 (65 FE) MG PO TABS
325.0000 mg | ORAL_TABLET | Freq: Three times a day (TID) | ORAL | Status: DC
  Administered 2020-08-19 – 2020-08-23 (×14): 325 mg via ORAL
  Filled 2020-08-18 (×16): qty 1

## 2020-08-18 MED ORDER — GLUCERNA SHAKE PO LIQD
237.0000 mL | Freq: Three times a day (TID) | ORAL | Status: DC
  Administered 2020-08-18 – 2020-08-19 (×2): 237 mL via ORAL

## 2020-08-18 MED ORDER — COLESEVELAM HCL 625 MG PO TABS
1875.0000 mg | ORAL_TABLET | Freq: Two times a day (BID) | ORAL | Status: DC
  Administered 2020-08-18 – 2020-08-23 (×8): 1875 mg via ORAL
  Filled 2020-08-18 (×11): qty 3

## 2020-08-18 MED ORDER — SODIUM CHLORIDE 0.9 % IV SOLN: Freq: Once | INTRAVENOUS | Status: AC

## 2020-08-18 MED ORDER — SODIUM CHLORIDE 0.9 % IV SOLN
1.0000 g | INTRAVENOUS | Status: DC
  Administered 2020-08-18 – 2020-08-19 (×2): 1 g via INTRAVENOUS
  Filled 2020-08-18 (×3): qty 10

## 2020-08-18 MED ORDER — BENZTROPINE MESYLATE 0.5 MG PO TABS
0.5000 mg | ORAL_TABLET | Freq: Two times a day (BID) | ORAL | Status: DC
  Administered 2020-08-18 – 2020-08-23 (×10): 0.5 mg via ORAL
  Filled 2020-08-18 (×11): qty 1

## 2020-08-18 MED ORDER — ASPIRIN EC 81 MG PO TBEC
81.0000 mg | DELAYED_RELEASE_TABLET | Freq: Every day | ORAL | Status: DC
  Administered 2020-08-19 – 2020-08-23 (×5): 81 mg via ORAL
  Filled 2020-08-18 (×5): qty 1

## 2020-08-18 MED ORDER — SODIUM BICARBONATE 650 MG PO TABS
1300.0000 mg | ORAL_TABLET | Freq: Two times a day (BID) | ORAL | Status: DC
  Administered 2020-08-19 – 2020-08-23 (×11): 1300 mg via ORAL
  Filled 2020-08-18 (×11): qty 2

## 2020-08-18 MED ORDER — MIDODRINE HCL 5 MG PO TABS
2.5000 mg | ORAL_TABLET | Freq: Three times a day (TID) | ORAL | Status: DC
  Administered 2020-08-18: 2.5 mg via ORAL
  Filled 2020-08-18 (×2): qty 0.5

## 2020-08-18 NOTE — ED Notes (Signed)
Provider at bedside and aware of FSBS.

## 2020-08-18 NOTE — ED Notes (Signed)
Diet tray provided

## 2020-08-18 NOTE — ED Notes (Signed)
Report to Candice, RN

## 2020-08-18 NOTE — ED Notes (Signed)
Pt to ED for c/o weakness, states that bilat feet hurt. Pt appears emaciated, jaundiced/grey skin tone. Pt is very non concerned with health, more concerned with TV and getting a remote.

## 2020-08-18 NOTE — ED Notes (Signed)
Date and time results received: 08/18/20 2:29 PM   Test: Glucose  Critical Value: 624  Name of Provider Notified: Quentin Cornwall, MD   Orders Received? Or Actions Taken?: Actions Taken: MD aware.

## 2020-08-18 NOTE — ED Triage Notes (Signed)
Pt presents via POV with c/o right foot pain that began 5 days ago. Pt Has hx of diabetes. Pt states pain has gotten so bad he cannot walk on foot. Pt denies any recent injuries to extremities. Pt endorses dizziness at this time. Pt blood pressure 58/44,

## 2020-08-18 NOTE — Progress Notes (Signed)
Patient arrived to C pod.

## 2020-08-18 NOTE — ED Notes (Signed)
Pt cleaned of stool at this time. Bedding and brief changed by this tech and Therapist, sports.

## 2020-08-18 NOTE — ED Notes (Signed)
Rutherford Nail, NP at bedside. Verbal orders given.

## 2020-08-18 NOTE — ED Provider Notes (Signed)
[facility] Emergency Department Provider Note    First MD Initiated Contact with Patient 08/18/20 1329     (approximate)  I have reviewed the triage vital signs and the nursing notes.   HISTORY  Chief Complaint Foot Pain and Dizziness    HPI Ascher Schroepfer is a 44 y.o. male the below listed past medical history presents to the ER for evaluation of generalized malaise also complaining of some right foot pain stating he is no longer able to walk.  Has not had anything to eat or drink for the past 5 days.  Feeling lightheaded and dizzy.  Patient seen in triage and found to be significantly hypotensive brought emergently back to ER bed 16.  He is protecting his airway.  He is asking for something to eat.  Denies any chest pain right now.  No shortness of breath.    Past Medical History:  Diagnosis Date   COVID-19    Diabetes mellitus without complication ([place])    Gastroparesis    Tuberculosis    Family History  Problem Relation Age of Onset   Diabetes Mother    Diabetes Father    Past Surgical History:  Procedure Laterality Date   COLONOSCOPY N/A 01/25/2020   Procedure: COLONOSCOPY;  Surgeon: Toledo, Benay Pike, MD;  Location: [facility] ENDOSCOPY;  Service: Gastroenterology;  Laterality: N/A;   ESOPHAGOGASTRODUODENOSCOPY N/A 02/03/2019   Procedure: ESOPHAGOGASTRODUODENOSCOPY (EGD);  Surgeon: Toledo, Benay Pike, MD;  Location: [facility] ENDOSCOPY;  Service: Gastroenterology;  Laterality: N/A;   IR CATHETER TUBE CHANGE  06/13/2020   Patient Active Problem List   Diagnosis Date Noted   Sepsis secondary to UTI ([place]) 06/12/2020   E-coli UTI 06/12/2020   UTI due to Klebsiella species 06/12/2020   Hypothermia    Hyponatremia    Encephalopathy 06/09/2020   Neurogenic bladder    Penile abscess 05/06/2020   FTT (failure to thrive) in adult 05/03/2020   Weakness 05/02/2020   CKD (chronic kidney disease), stage IV ([place]) 05/02/2020   Type 1  diabetes mellitus with diabetic chronic kidney disease ([place])    Goals of care, counseling/discussion    Palliative care by specialist    Recurrent major depression-severe ([place]) 03/31/2020   Hyperkalemia    Iron deficiency anemia    Weight loss    Chronic diarrhea 02/07/2020   Abnormal CT of the chest    Acute urinary retention 02/06/2020   Hyperglycemia due to type 2 diabetes mellitus ([place]) 02/06/2020   History of Clostridium difficile colitis 02/06/2020   Generalized weakness 02/06/2020   History of COVID-19 02/06/2020   Personal history of other infectious and parasitic diseases 02/06/2020   Asthenia 02/06/2020   Diarrhea 01/21/2020   Tobacco abuse 01/21/2020   C. difficile colitis 01/21/2020   Urinary retention 01/21/2020   Diabetes mellitus without complication ([place]) 53/29/9242   Protein-calorie malnutrition, severe 01/02/2020   Odynophagia    DKA (diabetic ketoacidoses) 12/27/2019   Depression 12/27/2019   DKA, type 2 ([place]) 12/27/2019   Anemia of chronic disease 04/25/2019   Hypomagnesemia 04/25/2019   Hypotension 04/23/2019   CAP (community acquired pneumonia) due to MSSA (methicillin sensitive Staphylococcus aureus) ([place]) 04/14/2019   COVID-19 virus infection 04/14/2019   Pressure injury of skin 03/31/2019   Acute renal failure (ARF) ([place]) 03/06/2019   CAP (community acquired pneumonia) 02/14/2019   AKI (acute kidney injury) ([place]) 02/02/2019   Acute kidney failure, unspecified ([place]) 02/02/2019   ARF (acute renal failure) ([place]) 01/03/2019   Malnutrition of  moderate degree 02/24/2018   GERD (gastroesophageal reflux disease) 02/23/2018   Diabetic gastroparesis ([place]) 02/23/2018   Diabetic foot ulcer ([place]) 02/23/2018   Aspiration pneumonia ([place]) 04/21/2017   Hypoglycemia 04/21/2017   Diabetes mellitus with hyperglycemia ([place]) 04/21/2017   Hip fracture, unspecified laterality, closed, initial encounter ([place]) 04/17/2017    Hyperglycemia 04/17/2017   Anisocoria 02/26/2017   Adjustment disorder with depressed mood 07/02/2016   Hypocalcemia 07/02/2016   Personal history of tuberculosis 07/02/2016   Malnutrition ([place]) 04/09/2016   Cavitary lesion of lung 04/06/2016   Other disorders of lung 04/06/2016      Prior to Admission medications   Medication Sig Start Date End Date Taking? Authorizing Provider  aspirin EC 81 MG tablet Take 81 mg by mouth daily.    [provider]  benztropine (COGENTIN) 0.5 MG tablet Take 1 tablet (0.5 mg total) by mouth 2 (two) times daily. 04/09/20   Pokhrel, Corrie Mckusick, MD  colesevelam (WELCHOL) 625 MG tablet Take 3 tablets (1,875 mg total) by mouth 2 (two) times daily with a meal. 07/31/20   Lavina Hamman, MD  DULoxetine (CYMBALTA) 30 MG capsule Take 1 capsule (30 mg total) by mouth daily. 04/10/20   Pokhrel, Corrie Mckusick, MD  feeding supplement, GLUCERNA SHAKE, (GLUCERNA SHAKE) LIQD Take 237 mLs by mouth 3 (three) times daily between meals. 05/13/20   Fritzi Mandes, MD  ferrous sulfate 325 (65 FE) MG EC tablet Take 1 tablet (325 mg total) by mouth 3 (three) times daily with meals. 07/31/20 11/28/20  Lavina Hamman, MD  haloperidol (HALDOL) 0.5 MG tablet Take 1 tablet (0.5 mg total) by mouth 2 (two) times daily. 04/09/20   Pokhrel, Corrie Mckusick, MD  insulin glargine (LANTUS) 100 UNIT/ML injection Inject 0.08 mLs (8 Units total) into the skin daily. 07/31/20   Lavina Hamman, MD  loperamide (IMODIUM) 2 MG capsule Take 1 capsule (2 mg total) by mouth 4 (four) times daily as needed for diarrhea or loose stools. 07/31/20   Lavina Hamman, MD  megestrol (MEGACE) 400 MG/10ML suspension Take 5 mLs (200 mg total) by mouth daily. 08/01/20   Lavina Hamman, MD  Multiple Vitamin (MULTIVITAMIN WITH MINERALS) TABS tablet Take 1 tablet by mouth daily. 05/13/20   Fritzi Mandes, MD  NEXIUM 40 MG capsule Take 1 capsule (40 mg total) by mouth daily. 07/31/20   Lavina Hamman, MD  patiromer (VELTASSA) 8.4 g  packet Take 1 packet (8.4 g total) by mouth daily. 07/31/20   Lavina Hamman, MD  sodium bicarbonate 650 MG tablet Take 2 tablets (1,300 mg total) by mouth 2 (two) times daily. 07/31/20   Lavina Hamman, MD    Allergies Patient has no known allergies.    Social History Social History   Tobacco Use   Smoking status: Former Smoker    Types: Cigars, Cigarettes   Smokeless tobacco: Never Used  Scientific laboratory technician Use: Never used  Substance Use Topics   Alcohol use: No   Drug use: No    Review of Systems Patient denies headaches, rhinorrhea, blurry vision, numbness, shortness of breath, chest pain, edema, cough, abdominal pain, nausea, vomiting, diarrhea, dysuria, fevers, rashes or hallucinations unless otherwise stated above in HPI. ____________________________________________   PHYSICAL EXAM:  VITAL SIGNS: Vitals:   08/18/20 1317 08/18/20 1320  BP: (!) 65/42 110/86  Pulse: 93 76  Resp: 18   Temp: 97.7 F (36.5 C)   SpO2: 100%     Constitutional: Alert and oriented, cachectic  Eyes: Conjunctivae are normal.  Head: Atraumatic. Nose: No congestion/rhinnorhea. Mouth/Throat: Mucous membranes are moist.   Neck: No stridor. Painless ROM.  Cardiovascular: Normal rate, regular rhythm. Grossly normal heart sounds.  Respiratory: Normal respiratory effort.  No retractions. Lungs CTAB. Gastrointestinal: scaphoid, Soft and nontender. No distention. No abdominal bruits. No CVA tenderness. Genitourinary:  Musculoskeletal: No lower extremity tenderness nor edema.  No joint effusions. Cap refill 3 sec,  Extremities warm Neurologic:  Normal speech and language. No gross focal neurologic deficits are appreciated. No facial droop Skin:  Skin is warm, dry and intact. No rash noted. Psychiatric: Mood and affect are normal. Speech and behavior are normal.  ____________________________________________   LABS (all labs ordered are listed, but only abnormal results are  displayed)  Results for orders placed or performed during the hospital encounter of 08/18/20 (from the past 24 hour(s))  CBG monitoring, ED     Status: Abnormal   Collection Time: 08/18/20  1:16 PM  Result Value Ref Range   Glucose-Capillary 553 (HH) 70 - 99 mg/dL  Comprehensive metabolic panel     Status: Abnormal   Collection Time: 08/18/20  1:17 PM  Result Value Ref Range   Sodium 125 (L) 135 - 145 mmol/L   Potassium 4.8 3.5 - 5.1 mmol/L   Chloride 96 (L) 98 - 111 mmol/L   CO2 18 (L) 22 - 32 mmol/L   Glucose, Bld 624 (HH) 70 - 99 mg/dL   BUN 36 (H) 6 - 20 mg/dL   Creatinine, Ser 2.69 (H) 0.61 - 1.24 mg/dL   Calcium 8.6 (L) 8.9 - 10.3 mg/dL   Total Protein 6.9 6.5 - 8.1 g/dL   Albumin 3.2 (L) 3.5 - 5.0 g/dL   AST 12 (L) 15 - 41 U/L   ALT 14 0 - 44 U/L   Alkaline Phosphatase 74 38 - 126 U/L   Total Bilirubin 0.5 0.3 - 1.2 mg/dL   GFR, Estimated 29 (L) >60 mL/min   Anion gap 11 5 - 15  Lipase, blood     Status: Abnormal   Collection Time: 08/18/20  1:17 PM  Result Value Ref Range   Lipase 58 (H) 11 - 51 U/L  Troponin I (High Sensitivity)     Status: None   Collection Time: 08/18/20  1:17 PM  Result Value Ref Range   Troponin I (High Sensitivity) 3 <18 ng/L  Lactic acid, plasma     Status: Abnormal   Collection Time: 08/18/20  1:17 PM  Result Value Ref Range   Lactic Acid, Venous 3.4 (HH) 0.5 - 1.9 mmol/L  CBC with Differential     Status: Abnormal   Collection Time: 08/18/20  1:17 PM  Result Value Ref Range   WBC 6.3 4.0 - 10.5 K/uL   RBC 2.70 (L) 4.22 - 5.81 MIL/uL   Hemoglobin 8.4 (L) 13.0 - 17.0 g/dL   HCT 26.7 (L) 39 - 52 %   MCV 98.9 80.0 - 100.0 fL   MCH 31.1 26.0 - 34.0 pg   MCHC 31.5 30.0 - 36.0 g/dL   RDW 14.0 11.5 - 15.5 %   Platelets 379 150 - 400 K/uL   nRBC 0.0 0.0 - 0.2 %   Neutrophils Relative % 72 %   Neutro Abs 4.5 1.7 - 7.7 K/uL   Lymphocytes Relative 23 %   Lymphs Abs 1.5 0.7 - 4.0 K/uL   Monocytes Relative 5 %   Monocytes Absolute 0.3 0.1 -  1.0 K/uL   Eosinophils Relative  0 %   Eosinophils Absolute 0.0 0.0 - 0.5 K/uL   Basophils Relative 0 %   Basophils Absolute 0.0 0.0 - 0.1 K/uL   Immature Granulocytes 0 %   Abs Immature Granulocytes 0.02 0.00 - 0.07 K/uL  Blood gas, venous     Status: Abnormal   Collection Time: 08/18/20  1:47 PM  Result Value Ref Range   pH, Ven 7.28 7.25 - 7.43   pCO2, Ven 46 44 - 60 mmHg   pO2, Ven <31.0 (LL) 32 - 45 mmHg   Bicarbonate 21.6 20.0 - 28.0 mmol/L   Acid-base deficit 4.9 (H) 0.0 - 2.0 mmol/L   O2 Saturation 18.4 %   Patient temperature 37.0    Collection site VEIN    Sample type VENOUS   Respiratory Panel by RT PCR (Flu A&B, Covid) - Nasopharyngeal Swab     Status: None   Collection Time: 08/18/20  1:47 PM   Specimen: Nasopharyngeal Swab  Result Value Ref Range   SARS Coronavirus 2 by RT PCR NEGATIVE NEGATIVE   Influenza A by PCR NEGATIVE NEGATIVE   Influenza B by PCR NEGATIVE NEGATIVE  CBG monitoring, ED     Status: Abnormal   Collection Time: 08/18/20  3:47 PM  Result Value Ref Range   Glucose-Capillary 402 (H) 70 - 99 mg/dL   ____________________________________________  EKG My review and personal interpretation at Time: 13:32   Indication: hypotension  Rate: 70  Rhythm: sinus Axis: normal Other: nonspecific st abn, no stemi criteria ____________________________________________  RADIOLOGY  I personally reviewed all radiographic images ordered to evaluate for the above acute complaints and reviewed radiology reports and findings.  These findings were personally discussed with the patient.  Please see medical record for radiology report.  ____________________________________________   PROCEDURES  Procedure(s) performed:  .Critical Care Performed by: Merlyn Lot, MD Authorized by: Merlyn Lot, MD   Critical care provider statement:    Critical care time (minutes):  35   Critical care time was exclusive of:  Separately billable procedures and treating  other patients   Critical care was necessary to treat or prevent imminent or life-threatening deterioration of the following conditions:  Circulatory failure and dehydration   Critical care was time spent personally by me on the following activities:  Development of treatment plan with patient or surrogate, discussions with consultants, evaluation of patient's response to treatment, examination of patient, obtaining history from patient or surrogate, ordering and performing treatments and interventions, ordering and review of laboratory studies, ordering and review of radiographic studies, pulse oximetry, re-evaluation of patient's condition and review of old charts      Critical Care performed:  yes ____________________________________________   INITIAL IMPRESSION / ASSESSMENT AND PLAN / ED COURSE  Pertinent labs & imaging results that were available during my care of the patient were reviewed by me and considered in my medical decision making (see chart for details).   DDX: Sepsis, DKA, AKI, noncompliance, failure to thrive, dehydration,  Aydeen Blume is a 45 y.o. who presents to the ED with symptoms as described above.  Patient well-known is facility with multiple presentations similar fashion.  Today she he does have more profound hyperglycemia noted lactic acidosis as well with significant hypotension.  He is not complaining of any focal discomfort.  Does endorse decreased p.o. intake which is quite frequent for him.  I have given IV fluids.  Will order septic work-up though not convinced this is septic in etiology.  Is more likely hypovolemia.  We will continue to observe.  Will check to evaluate for any signs of DKA or hyper osmolar syndrome.  Clinical Course as of Aug 18 1601  [facility] Aug 18, 2020  1440 Patient's work-up does show evidence of significant hyperglycemia.  Also with pseudohyponatremia secondary to poorly controlled diabetes.  Will add on osmolality.  In the setting I  think that his lactic acidosis is not consistent with sepsis given his absence of specific symptoms fever or white count.  I think is more consistent with significant dehydration and malnutrition.  We will continue with IV hydration will give IV insulin.    [PR]    Clinical Course User Index [PR] Merlyn Lot, MD    The patient was evaluated in Emergency Department today for the symptoms described in the history of present illness. He/she was evaluated in the context of the global COVID-19 pandemic, which necessitated consideration that the patient might be at risk for infection with the SARS-CoV-2 virus that causes COVID-19. Institutional protocols and algorithms that pertain to the evaluation of patients at risk for COVID-19 are in a state of rapid change based on information released by regulatory bodies including the CDC and federal and state organizations. These policies and algorithms were followed during the patient's care in the ED.  As part of my medical decision making, I reviewed the following data within the [place] notes reviewed and incorporated, Labs reviewed, notes from prior ED visits and [place] Controlled Substance Database   ____________________________________________   FINAL CLINICAL IMPRESSION(S) / ED DIAGNOSES  Final diagnoses:  Hypotension due to hypovolemia  Hyperglycemia      NEW MEDICATIONS STARTED DURING THIS VISIT:  New Prescriptions   No medications on file     Note:  This document was prepared using Dragon voice recognition software and may include unintentional dictation errors.    Merlyn Lot, MD 08/18/20 [phone]

## 2020-08-18 NOTE — ED Notes (Signed)
Report called and transport in place

## 2020-08-18 NOTE — H&P (Signed)
History and Physical   Gregory Moody ZJI:[id number] DOB: 1976/02/23 DOA: 08/18/2020  PCP: [facility]: [facility] Patient coming from: home  I have personally briefly reviewed patient's old medical records in [place].  Chief Concern: weakness and right lower foot and leg pain.   HPI: Gregory Moody is a 44 y.o. male with medical history significant for diabetes mellitus insulin-dependent, GERD, gastroparesis, history of DKA, anemia of chronic disease, tobacco abuse, esophageal candidiasis, history of COVID-19 infection on 04/23/2019, history of C. difficile colitis, failure to thrive, presented to the emergency department for chief concerns of generalized weakness and right leg pain.  He reports the weakness and leg pain that started about 4-5 days ago.  He states the pain on right lower extremity starts from foot to hip.  He denies trauma.  He reports that he has not tried anything to make it better.  He states that prior to 5 days ago, he was able to ambulate with a cane.  He reports that he has not been using his insulin for 10 days because he lost glucometer and got a new one but doesn't know how to use it. So he has not been giving himself insulin for 10 days.  ROS: negative headache, vomiting, shortness of breath, chest pain, dysuria, cough, fever,   ROS: Positive for nausea, blurry vision, no appetite for 4 days, heart burn.   He reports that he has not been able to keep anything down for the last 5-7 days due to no appetite and is requesting an appetite stimulant.  Social history: lives with mom and father and son.   ED Course: discussed with ED provider. ED provider requesting admission for AKI and weakness.   Review of Systems: As per HPI otherwise 10 point review of systems negative.  Assessment/Plan  Principal Problem:   Weakness Active Problems:   Malnutrition ([place])    Hyperglycemia   Diabetes mellitus with hyperglycemia ([place])   Diabetic gastroparesis (HCC)   AKI (acute kidney injury) ([place])   Acute renal failure (ARF) (HCC)   Hypotension   Anemia of chronic disease   Depression   Protein-calorie malnutrition, severe   Acute urinary retention   FTT (failure to thrive) in adult   Weakness-multifactorial in setting of poorly controlled diabetes mellitus, poor appetite, lack of insulin use, difficulty accessing healthcare, poor insight into medical condition -Recommend nursing to conduct insulin and glucometer use education with green screen interpreter service at bedside -If appropriate consideration given to consult to palliative versus SNF care as patient's does not understand his medical condition and is very dependent on others to provide care for him -Troponin high-sensitivity negative x2 -Check thyroid, phosphorus  Right lower extremity leg pain-doubt patient has been ambulating normally with a cane 5 days prior to presentation -Ultrasound of lower extremity ordered  Possible UTI-UA positive for leukocytes, and nitrites, ceftriaxone IV  Hyperglycemia in setting of insulin-dependent diabetes mellitus with poor control DM2  -Elevated on admission with no anion gap, presumed secondary to prolonged lack of insulin use -Status post 10 units of regular insulin in the ED -Continued home regimen -Recommended patient take home glucometer to commercial pharmacy for assistance  Acute on chronic CKD 4-suspect prerenal due to decreased p.o. intake and dehydration for the last 5-7 days -status post bolus hydration per ED provider -IVF with normal saline 150 cc/h -BMP in the a.m.  Hypotension-otherwise stable, MAP has been > 70 at bedside - Continue NS at 150  cc/hr -Midodrine 2.5 mg p.o. 3 times daily with meals as needed for MAP less than 65  Hyponatremia with sodium level of 125 -Hyperosmolar hyperglycemia, corrected sodium is 138  Anemia of chronic  disease-iron tablets 325 3 times daily with meals  Depression-denies SI HI, resumed duloxetine 30 mg daily p.o.  Failure to thrive Protein calorie malnutrition-severe Malnutrition -Dietary consult has been placed for supplementation and nutrition assessment -Diet: Diabetes/renal  Left elbow wound - poa, wound consult placed  DVT prophylaxis: Heparin Code Status: Full code Diet: Diabetic/renal Family Communication: Patient states his family knows he is in the hospital Disposition Plan: Pending clinical course Consults called: Dietary Admission status: Observation with telemetry  Past Medical History:  Diagnosis Date  . COVID-19   . Diabetes mellitus without complication ([place])   . Gastroparesis   . Tuberculosis    Past Surgical History:  Procedure Laterality Date  . COLONOSCOPY N/A 01/25/2020   Procedure: COLONOSCOPY;  Surgeon: Toledo, Benay Pike, MD;  Location: [facility] ENDOSCOPY;  Service: Gastroenterology;  Laterality: N/A;  . ESOPHAGOGASTRODUODENOSCOPY N/A 02/03/2019   Procedure: ESOPHAGOGASTRODUODENOSCOPY (EGD);  Surgeon: Toledo, Benay Pike, MD;  Location: [facility] ENDOSCOPY;  Service: Gastroenterology;  Laterality: N/A;  . IR CATHETER TUBE CHANGE  06/13/2020   Social History:  reports that he has quit smoking. His smoking use included cigars and cigarettes. He has never used smokeless tobacco. He reports that he does not drink alcohol and does not use drugs.  No Known Allergies Family History  Problem Relation Age of Onset  . Diabetes Mother   . Diabetes Father    Family history: Family history reviewed and   Prior to Admission medications   Medication Sig Start Date End Date Taking? Authorizing Provider  aspirin EC 81 MG tablet Take 81 mg by mouth daily.    [provider]  benztropine (COGENTIN) 0.5 MG tablet Take 1 tablet (0.5 mg total) by mouth 2 (two) times daily. 04/09/20   Pokhrel, Corrie Mckusick, MD  colesevelam (WELCHOL) 625 MG tablet Take 3 tablets (1,875 mg total) by  mouth 2 (two) times daily with a meal. 07/31/20   Lavina Hamman, MD  DULoxetine (CYMBALTA) 30 MG capsule Take 1 capsule (30 mg total) by mouth daily. 04/10/20   Pokhrel, Corrie Mckusick, MD  feeding supplement, GLUCERNA SHAKE, (GLUCERNA SHAKE) LIQD Take 237 mLs by mouth 3 (three) times daily between meals. 05/13/20   Fritzi Mandes, MD  ferrous sulfate 325 (65 FE) MG EC tablet Take 1 tablet (325 mg total) by mouth 3 (three) times daily with meals. 07/31/20 11/28/20  Lavina Hamman, MD  haloperidol (HALDOL) 0.5 MG tablet Take 1 tablet (0.5 mg total) by mouth 2 (two) times daily. 04/09/20   Pokhrel, Corrie Mckusick, MD  insulin glargine (LANTUS) 100 UNIT/ML injection Inject 0.08 mLs (8 Units total) into the skin daily. 07/31/20   Lavina Hamman, MD  loperamide (IMODIUM) 2 MG capsule Take 1 capsule (2 mg total) by mouth 4 (four) times daily as needed for diarrhea or loose stools. 07/31/20   Lavina Hamman, MD  megestrol (MEGACE) 400 MG/10ML suspension Take 5 mLs (200 mg total) by mouth daily. 08/01/20   Lavina Hamman, MD  Multiple Vitamin (MULTIVITAMIN WITH MINERALS) TABS tablet Take 1 tablet by mouth daily. 05/13/20   Fritzi Mandes, MD  NEXIUM 40 MG capsule Take 1 capsule (40 mg total) by mouth daily. 07/31/20   Lavina Hamman, MD  patiromer (VELTASSA) 8.4 g packet Take 1 packet (8.4 g total) by mouth  daily. 07/31/20   Lavina Hamman, MD  sodium bicarbonate 650 MG tablet Take 2 tablets (1,300 mg total) by mouth 2 (two) times daily. 07/31/20   Lavina Hamman, MD   Physical Exam: Vitals:   08/18/20 1830 08/18/20 1900 08/18/20 1954 08/18/20 2026  BP: (!) 88/60 98/64 (!) 92/59 95/69  Pulse: 80 85 84 81  Resp: 10 12 16 16   Temp:   97.7 F (36.5 C)   TempSrc:      SpO2: 100% 100% 100% 100%   Constitutional: bilateral temporal wasting and orbital wasting, appears frail, cachectic, NAD, calm, comfortable Eyes: PERRL, lids and conjunctivae normal ENMT: Mucous membranes are moist. Posterior pharynx clear of any exudate or  lesions. poor dentition. Hearing appropriate Neck: normal, supple, no masses, no thyromegaly Respiratory: clear to auscultation bilaterally, no wheezing, no crackles. Normal respiratory effort. No accessory muscle use.  Cardiovascular: Regular rate and rhythm, no murmurs / rubs / gallops. No extremity edema. 2+ pedal pulses. No carotid bruits.  Abdomen: no tenderness, no masses palpated, no hepatosplenomegaly. Bowel sounds positive. Suprapubic catheter present  Musculoskeletal: clubbing of fingers present. No joint deformity upper and lower extremities. Good ROM, no contractures. Extremity atrophy appears chronic in nature.  Skin: bilateral elbow wound present on admission. No induration Neurologic: CN 2-12 grossly intact. Sensation intact. Strength 4/5 in all 4.  Psychiatric: Normal judgment and insight. Alert and oriented x 3. Normal mood.   EKG: Independently reviewed, showing sinus rhythm, with rate of 68, QTc 429  Chest x-ray on Admission: Personally reviewed and I agree with radiologist reading as below.  DG Chest Portable 1 View  Result Date: 08/18/2020 CLINICAL DATA:  Right foot pain starting 5 days ago. EXAM: PORTABLE CHEST 1 VIEW COMPARISON:  None. FINDINGS: Right nodular opacity peripherally along the minor fissure is stable and was evaluated by CT on 12/29/2019 as likely scarring related. No focal consolidation in the left lung. No pneumothorax or pleural effusion. The cardiopericardial silhouette is within normal limits for size. The visualized bony structures of the thorax show no acute abnormality. Telemetry leads overlie the chest. IMPRESSION: Stable.  No new or acute interval findings. Electronically Signed   By: Misty Stanley M.D.   On: 08/18/2020 14:27   DG Foot Complete Right  Result Date: 08/18/2020 CLINICAL DATA:  Right foot pain for 5 days, inability to ambulate EXAM: RIGHT FOOT COMPLETE - 3+ VIEW COMPARISON:  02/22/2018 FINDINGS: Frontal, oblique, and lateral views of the  right foot are obtained. There is subtle sclerosis at the base of the second metatarsal. This could reflect stress reaction. No displaced fractures. 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 Mild joint space narrowing and osteophyte formation within the midfoot. Soft tissues are unremarkable. IMPRESSION: 1. Sclerosis at the base of the second metatarsal which could reflect stress fracture. Follow-up outpatient MRI or three-phase bone scan could be performed if clinical symptoms exist in this region. 2. 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 3. No displaced fractures. Electronically Signed   By: Randa Ngo M.D.   On: 08/18/2020 15:13   Labs on Admission: I have personally reviewed following labs  CBC: Recent Labs  Lab 08/13/20 1653 08/18/20 1317  WBC 6.5 6.3  NEUTROABS  --  4.5  HGB 8.5* 8.4*  HCT 24.3* 26.7*  MCV 90.0 98.9  PLT 440* 527   Basic Metabolic Panel: Recent Labs  Lab 08/13/20 1653 08/18/20 1317 08/18/20 1551  NA 128* 125*  --   K 4.2 4.8  --  CL 97* 96*  --   CO2 22 18*  --   GLUCOSE 389* 624*  --   BUN 31* 36*  --   CREATININE 2.31* 2.69*  --   CALCIUM 8.7* 8.6*  --   PHOS  --   --  2.5   GFR: Estimated Creatinine Clearance: 24.5 mL/min (A) (by C-G formula based on SCr of 2.69 mg/dL (H)). Liver Function Tests: Recent Labs  Lab 08/13/20 1653 08/18/20 1317  AST 13* 12*  ALT 27 14  ALKPHOS 73 74  BILITOT 0.6 0.5  PROT 7.0 6.9  ALBUMIN 3.4* 3.2*   Recent Labs  Lab 08/13/20 1653 08/18/20 1317  LIPASE 47 58*   CBG: Recent Labs  Lab 08/18/20 1316 08/18/20 1547 08/18/20 1710  GLUCAP 553* 402* 335*   Urine analysis:    Component Value Date/Time   COLORURINE YELLOW (A) 08/18/2020 1551   APPEARANCEUR TURBID (A) 08/18/2020 1551   LABSPEC 1.015 08/18/2020 1551   PHURINE 5.0 08/18/2020 1551   GLUCOSEU >=500 (A) 08/18/2020 1551   HGBUR MODERATE (A) 08/18/2020 1551   BILIRUBINUR NEGATIVE 08/18/2020  1551   KETONESUR NEGATIVE 08/18/2020 1551   PROTEINUR 100 (A) 08/18/2020 1551   NITRITE POSITIVE (A) 08/18/2020 1551   LEUKOCYTESUR LARGE (A) 08/18/2020 1551   Jaidin Ugarte N Alinda Egolf D.O. [facility]  If 12AM-7AM, please contact overnight-coverage provider If 7AM-7PM, please contact day coverage provider [email or web address]  08/18/2020, 8:29 PM

## 2020-08-18 NOTE — ED Notes (Signed)
Report called to Hilda Blades, Therapist, sports.

## 2020-08-18 NOTE — Progress Notes (Signed)
Suprapubic Cath Change  Patient is present today for a suprapubic catheter change due to urinary retention.  13ml of water was drained from the balloon, a 16FR foley cath was removed from the tract without difficulty.  Site was cleaned and prepped in a sterile fashion with betadine.  A 16FR foley cath was replaced into the tract no complications were noted. Urine return was noted, 10 ml of sterile water was inflated into the balloon and a leg bag was attached for drainage.  Patient tolerated well.  A leg bag and a night bag was given to patient and proper instruction was given on how to switch bags.    Performed by: Debroah Loop, PA-C   Additional notes: Patient reports tolerating suprapubic catheter well.  He denies pain at this time.  No evidence of infection at the [facility] tube site consistent with prior.  Follow up: Return in about 4 weeks (around 09/15/2020) for SPT exchange.

## 2020-08-19 ENCOUNTER — Ambulatory Visit: Payer: Self-pay | Admitting: Physician Assistant

## 2020-08-19 DIAGNOSIS — N390 Urinary tract infection, site not specified: Secondary | ICD-10-CM

## 2020-08-19 DIAGNOSIS — N170 Acute kidney failure with tubular necrosis: Secondary | ICD-10-CM

## 2020-08-19 DIAGNOSIS — E872 Acidosis, unspecified: Secondary | ICD-10-CM

## 2020-08-19 DIAGNOSIS — M79661 Pain in right lower leg: Secondary | ICD-10-CM

## 2020-08-19 LAB — CBC
HCT: 23.8 % — ABNORMAL LOW (ref 39.0–52.0)
Hemoglobin: 8 g/dL — ABNORMAL LOW (ref 13.0–17.0)
MCH: 31.6 pg (ref 26.0–34.0)
MCHC: 33.6 g/dL (ref 30.0–36.0)
MCV: 94.1 fL (ref 80.0–100.0)
Platelets: 344 10*3/uL (ref 150–400)
RBC: 2.53 MIL/uL — ABNORMAL LOW (ref 4.22–5.81)
RDW: 13.6 % (ref 11.5–15.5)
WBC: 7.5 10*3/uL (ref 4.0–10.5)
nRBC: 0 % (ref 0.0–0.2)

## 2020-08-19 LAB — COMPREHENSIVE METABOLIC PANEL
ALT: 12 U/L (ref 0–44)
AST: 12 U/L — ABNORMAL LOW (ref 15–41)
Albumin: 3.1 g/dL — ABNORMAL LOW (ref 3.5–5.0)
Alkaline Phosphatase: 80 U/L (ref 38–126)
Anion gap: 9 (ref 5–15)
BUN: 37 mg/dL — ABNORMAL HIGH (ref 6–20)
CO2: 18 mmol/L — ABNORMAL LOW (ref 22–32)
Calcium: 8.1 mg/dL — ABNORMAL LOW (ref 8.9–10.3)
Chloride: 108 mmol/L (ref 98–111)
Creatinine, Ser: 2.09 mg/dL — ABNORMAL HIGH (ref 0.61–1.24)
GFR, Estimated: 39 mL/min — ABNORMAL LOW (ref >60)
Glucose, Bld: 298 mg/dL — ABNORMAL HIGH (ref 70–99)
Potassium: 3.9 mmol/L (ref 3.5–5.1)
Sodium: 135 mmol/L (ref 135–145)
Total Bilirubin: 0.4 mg/dL (ref 0.3–1.2)
Total Protein: 6.4 g/dL — ABNORMAL LOW (ref 6.5–8.1)

## 2020-08-19 LAB — GLUCOSE, CAPILLARY
Glucose-Capillary: 175 mg/dL — ABNORMAL HIGH (ref 70–99)
Glucose-Capillary: 197 mg/dL — ABNORMAL HIGH (ref 70–99)
Glucose-Capillary: 202 mg/dL — ABNORMAL HIGH (ref 70–99)
Glucose-Capillary: 205 mg/dL — ABNORMAL HIGH (ref 70–99)
Glucose-Capillary: 238 mg/dL — ABNORMAL HIGH (ref 70–99)

## 2020-08-19 LAB — PROTIME-INR
INR: 1 (ref 0.8–1.2)
Prothrombin Time: 12.7 seconds (ref 11.4–15.2)

## 2020-08-19 LAB — LACTIC ACID, PLASMA: Lactic Acid, Venous: 1.6 mmol/L (ref 0.5–1.9)

## 2020-08-19 NOTE — Progress Notes (Signed)
Inpatient Diabetes Program Recommendations  AACE/ADA: New Consensus Statement on Inpatient Glycemic Control (2015)  Target Ranges:  Prepandial:   less than 140 mg/dL      Peak postprandial:   less than 180 mg/dL (1-2 hours)      Critically ill patients:  140 - 180 mg/dL   Lab Results  Component Value Date   GLUCAP 205 (H) 08/19/2020   HGBA1C 8.5 (H) 07/26/2020    Review of Glycemic Control Results for Gregory Moody, Gregory Moody (MRN [id number]) as of 08/19/2020 08:58  Ref. Range 08/18/2020 15:47 08/18/2020 17:10 08/18/2020 20:32 08/19/2020 00:55 08/19/2020 07:42  Glucose-Capillary Latest Ref Range: 70 - 99 mg/dL 402 (H) 335 (H) 482 (H) 238 (H) 205 (H)   Diabetes history: DM1 Outpatient Diabetes medications: Lantus 8 units  Current orders for Inpatient glycemic control: Lantus 8 units qd + Novolog 0-6 units qid correction  Inpatient Diabetes Program Recommendations:   Please consider: -Increase Lantus to 10 units daily -Add Novolog 2 units tid meal coverage if eats 50% Secure chat sent to Dr. Posey Pronto.  Thank you, [name]. Amaree Leeper, RN, MSN, CDE  Diabetes Coordinator Inpatient Glycemic Control Team Team Pager [phone] (8am-5pm) 08/19/2020 8:59 AM

## 2020-08-19 NOTE — Progress Notes (Addendum)
[facility] [place] NAME: Gregory Moody    MR#:  [id number]  DATE OF BIRTH:  [birth year]  SUBJECTIVE:  patient denies any complaints. Feeling better. Ate good meals. Complains of right foot pain. No trauma.  REVIEW OF SYSTEMS:   Review of Systems  Constitutional: Negative for chills and weight loss.  HENT: Negative for ear discharge, ear pain and nosebleeds.   Eyes: Negative for blurred vision, pain and discharge.  Respiratory: Negative for sputum production, shortness of breath, wheezing and stridor.   Cardiovascular: Negative for chest pain, palpitations, orthopnea and PND.  Gastrointestinal: Negative for abdominal pain, diarrhea, nausea and vomiting.  Genitourinary: Negative for frequency and urgency.  Musculoskeletal: Positive for joint pain. Negative for back pain.  Neurological: Positive for weakness. Negative for sensory change, speech change and focal weakness.  Psychiatric/Behavioral: Negative for depression and hallucinations. The patient is not nervous/anxious.    Tolerating Diet:yes Tolerating PT: pending  DRUG ALLERGIES:  No Known Allergies  VITALS:  Blood pressure 125/90, pulse 71, temperature 98.4 F (36.9 C), resp. rate 16, SpO2 100 %.  PHYSICAL EXAMINATION:   Physical Exam  GENERAL:  44 y.o.-year-old patient lying in the bed with no acute distress. Thin cachectic HEENT: Head atraumatic, normocephalic. Oropharynx and nasopharynx clear.  LUNGS: Normal breath sounds bilaterally, no wheezing, rales, rhonchi. No use of accessory muscles of respiration.  CARDIOVASCULAR: S1, S2 normal. No murmurs, rubs, or gallops.  ABDOMEN: Soft, nontender, nondistended. Bowel sounds present. No organomegaly or mass.  EXTREMITIES: some tenderness right foot on palpation. NEUROLOGIC: Cranial nerves II through XII are intact. No focal Motor or sensory deficits b/l.   PSYCHIATRIC:  patient is alert and oriented x 3.  SKIN:   Pressure Injury 12/28/19 Elbow Left Stage 2 -  Partial thickness loss of dermis presenting as a shallow open injury with a red, pink wound bed without slough. (Active)  12/28/19 1422  Location: Elbow  Location Orientation: Left  Staging: Stage 2 -  Partial thickness loss of dermis presenting as a shallow open injury with a red, pink wound bed without slough.  Wound Description (Comments):   Present on Admission: Yes (Present on transfer to 1A)     Pressure Injury 12/28/19 Elbow Right Stage 3 -  Full thickness tissue loss. Subcutaneous fat may be visible but bone, tendon or muscle are NOT exposed. (Active)  12/28/19 1422  Location: Elbow  Location Orientation: Right  Staging: Stage 3 -  Full thickness tissue loss. Subcutaneous fat may be visible but bone, tendon or muscle are NOT exposed.  Wound Description (Comments):   Present on Admission: Yes (Present on transfer to 1A)     Pressure Injury 08/18/20 Elbow Bilateral Unstageable - Full thickness tissue loss in which the base of the injury is covered by slough (yellow, tan, gray, green or brown) and/or eschar (tan, brown or black) in the wound bed. (Active)  08/18/20 2213  Location: Elbow  Location Orientation: Bilateral  Staging: Unstageable - Full thickness tissue loss in which the base of the injury is covered by slough (yellow, tan, gray, green or brown) and/or eschar (tan, brown or black) in the wound bed.  Wound Description (Comments):   Present on Admission:      Pressure Injury 08/18/20 Rectum Upper tan in wound bed (Active)  08/18/20 2220  Location: Rectum  Location Orientation: Upper  Staging:   Wound Description (Comments): tan in wound bed  Present on Admission:  LABORATORY PANEL:  CBC Recent Labs  Lab 08/19/20 0014  WBC 7.5  HGB 8.0*  HCT 23.8*  PLT 344    Chemistries  Recent Labs  Lab 08/19/20 0014  NA 135  K 3.9  CL 108  CO2 18*  GLUCOSE 298*  BUN 37*  CREATININE 2.09*  CALCIUM 8.1*   AST 12*  ALT 12  ALKPHOS 80  BILITOT 0.4   Cardiac Enzymes No results for input(s): TROPONINI in the last 168 hours. RADIOLOGY:  US Venous Img Lower Bilateral (DVT)  Result Date: 08/18/2020 CLINICAL DATA:  Pain swelling EXAM: BILATERAL LOWER EXTREMITY VENOUS DOPPLER ULTRASOUND TECHNIQUE: Gray-scale sonography with compression, as well as color and duplex ultrasound, were performed to evaluate the deep venous system(s) from the level of the common femoral vein through the popliteal and proximal calf veins. COMPARISON:  None. FINDINGS: VENOUS Normal compressibility of the common femoral, superficial femoral, and popliteal veins, as well as the visualized calf veins. Visualized portions of profunda femoral vein and great saphenous vein unremarkable. No filling defects to suggest DVT on grayscale or color Doppler imaging. Doppler waveforms show normal direction of venous flow, normal respiratory plasticity and response to augmentation. OTHER None. Limitations: none IMPRESSION: Negative. Electronically Signed   By: Constance Holster M.D.   On: 08/18/2020 21:20   DG Chest Portable 1 View  Result Date: 08/18/2020 CLINICAL DATA:  Right foot pain starting 5 days ago. EXAM: PORTABLE CHEST 1 VIEW COMPARISON:  None. FINDINGS: Right nodular opacity peripherally along the minor fissure is stable and was evaluated by CT on 12/29/2019 as likely scarring related. No focal consolidation in the left lung. No pneumothorax or pleural effusion. The cardiopericardial silhouette is within normal limits for size. The visualized bony structures of the thorax show no acute abnormality. Telemetry leads overlie the chest. IMPRESSION: Stable.  No new or acute interval findings. Electronically Signed   By: Misty Stanley M.D.   On: 08/18/2020 14:27   DG Foot Complete Right  Result Date: 08/18/2020 CLINICAL DATA:  Right foot pain for 5 days, inability to ambulate EXAM: RIGHT FOOT COMPLETE - 3+ VIEW COMPARISON:  02/22/2018  FINDINGS: Frontal, oblique, and lateral views of the right foot are obtained. There is subtle sclerosis at the base of the second metatarsal. This could reflect stress reaction. No displaced fractures. 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 Mild joint space narrowing and osteophyte formation within the midfoot. Soft tissues are unremarkable. IMPRESSION: 1. Sclerosis at the base of the second metatarsal which could reflect stress fracture. Follow-up outpatient MRI or three-phase bone scan could be performed if clinical symptoms exist in this region. 2. 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 3. No displaced fractures. Electronically Signed   By: Randa Ngo M.D.   On: 08/18/2020 15:13   ASSESSMENT AND PLAN:  Gregory Moody is a 44 y.o. male with medical history significant for diabetes mellitus insulin-dependent, GERD, gastroparesis, history of DKA, anemia of chronic disease, tobacco abuse, esophageal candidiasis, history of COVID-19 infection on 04/23/2019, history of C. difficile colitis, failure to thrive, presented to the emergency department for chief concerns of generalized weakness and right leg pain x5 days  Patient earlier in the day went to urology office where his suprapubic catheter was exchanged.  Generalized weakness/UTI/chronic suprapubic catheter (exchanged on 08/18/2020) acute on chronic renal failure IIIb (baseline creat 1.5-.18) Acidosis Hypotension -continue IV fluids. Blood pressure looks much better. Lactic acid trending down -given above symptoms presentation and recent exchange of suprapubic catheter  will continue IV Rocephin. Await urine culture results. Patient remains afebrile- -white count normal -tolerating PO   Right foot pain -denies any trauma. Uses a cane to walk. -X-ray right foot shows Sclerosis at the base of the second metatarsal which could reflect stress fracture. Follow-up outpatient MRI or  three-phase bone scan could be performed if clinical symptoms exist in this region. 2. 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 3. No displaced fractures. -- Orthopedic consultation with Dr. Harlow Mares-- secure chat message sent await recommendations- -Addendum--Dr Harlow Mares sent a message to  Have pt use post-op shoe, PT and f/u out pt -- PT   Hypotension chronic Cont IVF -Midodrine tid  Hyponatremia secondary to dehydration and elevated sugars -resolved after IV fluids and sugar stable  Hyperglycemia in the setting of insulin-dependent diabetes with peripheral vascular disease/CKD stage IIIb -continue insulin regimen follow-up diabetes coordinator input  Anemia of chronic disease continue iron pills  chronic depression continue duloxetine, Haldol, Cogentin  severe protein calorie malnutrition-chronic follow dietary recommendation  history of chronic urinary retention status post suprapubic catheter placement-- chronic -follows with urology as outpatient -catheter exchanged in the office on 18 August 2020.  chronic skin ulcers as above  Procedures: Family communication : none Consults : orthopedic CODE STATUS: full DVT Prophylaxis : heparin  Status is: Inpatient  Remains inpatient appropriate because:IV treatments appropriate due to intensity of illness or inability to take PO and Inpatient level of care appropriate due to severity of illness   Dispo: The patient is from: Home              Anticipated d/c is to: Home              Anticipated d/c date is: 2 days              Patient currently is not medically stable to d/c.  Patient admitted with acute renal failure, acidosis, right foot pain. Orthopedic consultation placed. PT eval pending. TOC for discharge planning.  Patient should be able to go home by Friday.    TOTAL TIME TAKING CARE OF THIS PATIENT: 25 minutes.  >50% time spent on counselling and coordination of care  Note: This  dictation was prepared with Dragon dictation along with smaller phrase technology. Any transcriptional errors that result from this process are unintentional.  Fritzi Mandes M.D    [facility]   CC: Primary care physician; [facility]Patient ID: Gregory Moody, male   DOB: [birth year], 44 y.o.   MRN: [id number]

## 2020-08-19 NOTE — Plan of Care (Signed)
  Problem: Education: Goal: Knowledge of General Education information will improve Description: Including pain rating scale, medication(s)/side effects and non-pharmacologic comfort measures Outcome: Progressing   Problem: Health Behavior/Discharge Planning: Goal: Ability to manage health-related needs will improve Outcome: Progressing   Problem: Clinical Measurements: Goal: Ability to maintain clinical measurements within normal limits will improve Outcome: Progressing Goal: Will remain free from infection Outcome: Progressing Goal: Diagnostic test results will improve Outcome: Progressing   Problem: Urinary Elimination: Goal: Signs and symptoms of infection will decrease Outcome: Progressing   Problem: Coping: Goal: Ability to adjust to condition or change in health will improve Outcome: Progressing   Problem: Fluid Volume: Goal: Ability to maintain a balanced intake and output will improve Outcome: Progressing   Problem: Health Behavior/Discharge Planning: Goal: Ability to identify and utilize available resources and services will improve Outcome: Progressing Goal: Ability to manage health-related needs will improve Outcome: Progressing   Problem: Health Behavior/Discharge Planning: Goal: Ability to identify and utilize available resources and services will improve Outcome: Progressing   Problem: Metabolic: Goal: Ability to maintain appropriate glucose levels will improve Outcome: Progressing   Problem: Nutritional: Goal: Maintenance of adequate nutrition will improve Outcome: Progressing Goal: Progress toward achieving an optimal weight will improve Outcome: Progressing   Problem: Skin Integrity: Goal: Risk for impaired skin integrity will decrease Outcome: Progressing   Problem: Tissue Perfusion: Goal: Adequacy of tissue perfusion will improve Outcome: Progressing

## 2020-08-19 NOTE — Progress Notes (Signed)
PHARMACY CONSULT NOTE - FOLLOW UP  Pharmacy Consult for Electrolyte Monitoring and Replacement   Recent Labs: Potassium (mmol/L)  Date Value  08/19/2020 3.9   Magnesium (mg/dL)  Date Value  07/26/2020 2.1   Calcium (mg/dL)  Date Value  08/19/2020 8.1 (L)   Calcium, Total (PTH) (mg/dL)  Date Value  04/17/2017 8.6 (L)   Albumin (g/dL)  Date Value  08/19/2020 3.1 (L)   Phosphorus (mg/dL)  Date Value  08/18/2020 2.5   Sodium (mmol/L)  Date Value  08/19/2020 135    Assessment: 44 yo male admitted with weakness, acute renal failure and UTI. Patient has severe chronic protein calorie malnutrition and is high risk for refeeding syndrome. Pharmacy consulted for electrolyte monitoring and replacement.   Goal of Therapy:  Electrolytes WNL   Plan:  No replacement warranted at this time.  Will F/U with AM labs and replace electrolytes as needed.   Pernell Dupre, PharmD, BCPS Clinical Pharmacist 08/19/2020 5:59 PM

## 2020-08-19 NOTE — Progress Notes (Signed)
Initial Nutrition Assessment  DOCUMENTATION CODES:   Severe malnutrition in context of chronic illness, Underweight  INTERVENTION:  Diet was liberalized to carbohydrate modified.  Continue Glucerna Shake po TID, each supplement provides 220 kcal and 10 grams of protein.  Continue MVI po daily.  Monitor magnesium, potassium, and phosphorus daily for at least 3 days, MD to replete as needed, as pt is at risk for refeeding syndrome.  NUTRITION DIAGNOSIS:   Severe Malnutrition related to chronic illness (uncontrolled DM, CKD, hx of food insecurity and inadequate oral intake related to this per pt report) as evidenced by severe fat depletion, severe muscle depletion.  GOAL:   Patient will meet greater than or equal to 90% of their needs  MONITOR:   PO intake, Supplement acceptance, Labs, Weight trends, I & O's, Skin  REASON FOR ASSESSMENT:   Malnutrition Screening Tool, Consult Assessment of nutrition requirement/status  ASSESSMENT:   44 year old male with PMHx of DM, gastroparesis, TB, GERD, anemia, tobacco abuse, esophageal candidiasis, COVID-19 infection 04/23/2019, recent C difficile colitis admitted with generalized weakness, acute on chronic renal failure, right foot pain.   Met with patient at bedside with assistance from [place] interpreter Optometrist). Patient reports his appetite was decreased for 2-3 days PTA but is improved now. He reports he eats 2-3 meals per day at home. His mother prepares food. Patient previously struggled with food insecurity. He reports he may eat eggs, beans, meat, and other foods. He is unable to provide very specific dietary recall. Patient reports he continues to struggle with chronic diarrhea. He is tolerating Glucerna and would like to keep drinking these.   UBW was 140-150 lbs prior to initial weight loss. Patient reports his weight fluctuates lately so he is unsure what he weights. Weights in chart do fluctuate between 44-60 kg. RD  obtained bed scale weight today of 46.8 kg (103.18 lbs).   Medications reviewed and include: ferrous sulfate 325 mg TID, Novolog 0-6 units TID, Lantus 8 untis daily, Megace 200 mg daily, MVI daily, Protonix, NS at 75 mL/hr, ceftriaxone.  Labs reviewed: CBG 175-238, CO2 18, BUN 37, Creatinine 2.09.  Discussed with MD via secure chat. Okay to liberalize diet to carbohydrate modified.  NUTRITION - FOCUSED PHYSICAL EXAM:    Most Recent Value  Orbital Region Severe depletion  Upper Arm Region Severe depletion  Thoracic and Lumbar Region Severe depletion  Buccal Region Severe depletion  Temple Region Severe depletion  Clavicle Bone Region Severe depletion  Clavicle and Acromion Bone Region Severe depletion  Scapular Bone Region Severe depletion  Dorsal Hand Severe depletion  Patellar Region Severe depletion  Anterior Thigh Region Severe depletion  Posterior Calf Region Severe depletion  Edema (RD Assessment) None  Hair Reviewed  Eyes Reviewed  Mouth Reviewed  Skin Reviewed  Nails Reviewed     Diet Order:   Diet Order            Diet Carb Modified Fluid consistency: Thin; Room service appropriate? Yes  Diet effective now                EDUCATION NEEDS:   No education needs have been identified at this time  Skin:  Skin Assessment: Skin Integrity Issues: Skin Integrity Issues:: Unstageable, Other (Comment) Unstageable: bilateral elbows Other: pressure injury to rectum (staging not documented)  Last BM:  08/19/2020 - large type 7  Height:   Ht Readings from Last 1 Encounters:  08/18/20 '5\' 8"'  (1.727 m)   Weight:  Wt Readings from Last 1 Encounters:  08/19/20 46.8 kg   BMI:  Body mass index is 15.69 kg/m.  Estimated Nutritional Needs:   Kcal:  1700-1900  Protein:  85-95 grams  Fluid:  1.5-1.7 L/day  Jacklynn Barnacle, MS, RD, LDN Pager number available on Amion

## 2020-08-20 LAB — GLUCOSE, CAPILLARY
Glucose-Capillary: 124 mg/dL — ABNORMAL HIGH (ref 70–99)
Glucose-Capillary: 138 mg/dL — ABNORMAL HIGH (ref 70–99)
Glucose-Capillary: 176 mg/dL — ABNORMAL HIGH (ref 70–99)
Glucose-Capillary: 135 mg/dL — ABNORMAL HIGH (ref 70–99)
Glucose-Capillary: 239 mg/dL — ABNORMAL HIGH (ref 70–99)

## 2020-08-20 LAB — BASIC METABOLIC PANEL
Anion gap: 5 (ref 5–15)
BUN: 27 mg/dL — ABNORMAL HIGH (ref 6–20)
CO2: 15 mmol/L — ABNORMAL LOW (ref 22–32)
Calcium: 8 mg/dL — ABNORMAL LOW (ref 8.9–10.3)
Chloride: 110 mmol/L (ref 98–111)
Creatinine, Ser: 1.62 mg/dL — ABNORMAL HIGH (ref 0.61–1.24)
GFR, Estimated: 53 mL/min — ABNORMAL LOW (ref >60)
Glucose, Bld: 142 mg/dL — ABNORMAL HIGH (ref 70–99)
Potassium: 3.8 mmol/L (ref 3.5–5.1)
Sodium: 130 mmol/L — ABNORMAL LOW (ref 135–145)

## 2020-08-20 LAB — MAGNESIUM: Magnesium: 1.8 mg/dL (ref 1.7–2.4)

## 2020-08-20 LAB — PHOSPHORUS: Phosphorus: 1.8 mg/dL — ABNORMAL LOW (ref 2.5–4.6)

## 2020-08-20 MED ORDER — ACETAMINOPHEN 325 MG PO TABS
650.0000 mg | ORAL_TABLET | Freq: Four times a day (QID) | ORAL | Status: DC | PRN
  Administered 2020-08-20: 650 mg via ORAL
  Filled 2020-08-20: qty 2

## 2020-08-20 MED ORDER — SODIUM CHLORIDE 0.9 % IV BOLUS
1000.0000 mL | Freq: Once | INTRAVENOUS | Status: AC
  Administered 2020-08-20: 1000 mL via INTRAVENOUS

## 2020-08-20 MED ORDER — POTASSIUM & SODIUM PHOSPHATES 280-160-250 MG PO PACK
2.0000 | PACK | Freq: Three times a day (TID) | ORAL | Status: AC
  Administered 2020-08-20 (×2): 2 via ORAL
  Filled 2020-08-20 (×3): qty 2

## 2020-08-20 MED ORDER — MIDODRINE HCL 5 MG PO TABS
2.5000 mg | ORAL_TABLET | Freq: Three times a day (TID) | ORAL | Status: DC
  Administered 2020-08-20 – 2020-08-21 (×2): 2.5 mg via ORAL
  Filled 2020-08-20 (×2): qty 1

## 2020-08-20 MED ORDER — LOPERAMIDE HCL 2 MG PO CAPS
2.0000 mg | ORAL_CAPSULE | Freq: Four times a day (QID) | ORAL | Status: DC | PRN
  Filled 2020-08-20: qty 1

## 2020-08-20 NOTE — Progress Notes (Signed)
PROGRESS NOTE    Gregory Moody  IFO:[id number] DOB: 24-Jan-1976 DOA: 08/18/2020 PCP: [place]    Brief Narrative:  Gregory Moody is a 44 y.o. male with medical history significant for diabetes mellitus insulin-dependent, GERD, gastroparesis, history of DKA, anemia of chronic disease, tobacco abuse, esophageal candidiasis, history of COVID-19 infection on 04/23/2019, history of C. difficile colitis, failure to thrive, presented to the emergency department for chief concerns of generalized weakness and right leg pain. Patient earlier in the day went to urology office where his suprapubic catheter was exchanged.   Consultants:     Procedures:  Venous ultrasound 11/8-negative Foot x-ray -Sclerosis at the base of the second metatarsal which could reflect stress fracture. Follow-up outpatient MRI or three-phase bone scan could be performed if clinical symptoms exist in this region. 2. 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 3. No displaced fractures.  antimicrobials:    ceftriaxone     Subjective:  Mild foot pain, but no other complaints. Still with chronic diarrhea.    Objective: Vitals:   08/20/20 0631 08/20/20 0726 08/20/20 1125 08/20/20 1534  BP: 119/81 127/89 (!) 126/94 120/84  Pulse: 71 72 80 82  Resp: 18 16 16 18   Temp: 99.1 F (37.3 C) 98.2 F (36.8 C) 98.6 F (37 C) 98.7 F (37.1 C)  TempSrc: Oral Oral    SpO2: 100% 100% 100% 100%  Weight:        Intake/Output Summary (Last 24 hours) at 08/20/2020 1545 Last data filed at 08/20/2020 1353 Gross per 24 hour  Intake 1055 ml  Output 1852 ml  Net -797 ml   Filed Weights   08/19/20 1732  Weight: 46.8 kg    Examination:  General exam: Appears calm and comfortable  Respiratory system: Clear to auscultation. Respiratory effort normal. Cardiovascular system: S1 & S2 heard, RRR. No JVD, murmurs, rubs, gallops or clicks. No pedal  edema. Gastrointestinal system: Abdomen is nondistended, soft and nontender. Normal bowel sounds heard. Central nervous system: Alert and oriented.  Grossly intact Extremities: No edema Skin: Warm dry Psychiatry: Mood & affect appropriate.     Data Reviewed: I have personally reviewed following labs and imaging studies  CBC: Recent Labs  Lab 08/13/20 1653 08/18/20 1317 08/19/20 0014  WBC 6.5 6.3 7.5  NEUTROABS  --  4.5  --   HGB 8.5* 8.4* 8.0*  HCT 24.3* 26.7* 23.8*  MCV 90.0 98.9 94.1  PLT 440* 379 676   Basic Metabolic Panel: Recent Labs  Lab 08/13/20 1653 08/18/20 1317 08/18/20 1551 08/19/20 0014 08/20/20 0525  NA 128* 125*  --  135 130*  K 4.2 4.8  --  3.9 3.8  CL 97* 96*  --  108 110  CO2 22 18*  --  18* 15*  GLUCOSE 389* 624*  --  298* 142*  BUN 31* 36*  --  37* 27*  CREATININE 2.31* 2.69*  --  2.09* 1.62*  CALCIUM 8.7* 8.6*  --  8.1* 8.0*  MG  --   --   --   --  1.8  PHOS  --   --  2.5  --  1.8*   GFR: Estimated Creatinine Clearance: 38.1 mL/min (A) (by C-G formula based on SCr of 1.62 mg/dL (H)). Liver Function Tests: Recent Labs  Lab 08/13/20 1653 08/18/20 1317 08/19/20 0014  AST 13* 12* 12*  ALT 27 14 12   ALKPHOS 73 74 80  BILITOT 0.6 0.5 0.4  PROT 7.0 6.9 6.4*  ALBUMIN 3.4*  3.2* 3.1*   Recent Labs  Lab 08/13/20 1653 08/18/20 1317  LIPASE 47 58*   No results for input(s): AMMONIA in the last 168 hours. Coagulation Profile: Recent Labs  Lab 08/19/20 0014  INR 1.0   Cardiac Enzymes: No results for input(s): CKTOTAL, CKMB, CKMBINDEX, TROPONINI in the last 168 hours. BNP (last 3 results) No results for input(s): PROBNP in the last 8760 hours. HbA1C: No results for input(s): HGBA1C in the last 72 hours. CBG: Recent Labs  Lab 08/19/20 1629 08/19/20 2153 08/20/20 0410 08/20/20 0726 08/20/20 1205  GLUCAP 197* 202* 124* 138* 176*   Lipid Profile: No results for input(s): CHOL, HDL, LDLCALC, TRIG, CHOLHDL, LDLDIRECT in the last  72 hours. Thyroid Function Tests: Recent Labs    08/18/20 1551  TSH 0.604   Anemia Panel: No results for input(s): VITAMINB12, FOLATE, FERRITIN, TIBC, IRON, RETICCTPCT in the last 72 hours. Sepsis Labs: Recent Labs  Lab 08/18/20 1317 08/18/20 1551 08/19/20 1031  LATICACIDVEN 3.4* 2.4* 1.6    Recent Results (from the past 240 hour(s))  Culture, blood (routine x 2)     Status: None (Preliminary result)   Collection Time: 08/18/20  1:21 PM   Specimen: BLOOD  Result Value Ref Range Status   Specimen Description BLOOD BLOOD LEFT FOREARM  Final   Special Requests   Final    BOTTLES DRAWN AEROBIC AND ANAEROBIC Blood Culture results may not be optimal due to an inadequate volume of blood received in culture bottles   Culture   Final    NO GROWTH 2 DAYS Performed at [facility], [address] [place], [place] [address]    Report Status PENDING  Incomplete  Culture, blood (routine x 2)     Status: None (Preliminary result)   Collection Time: 08/18/20  1:47 PM   Specimen: BLOOD  Result Value Ref Range Status   Specimen Description BLOOD RIGHT ANTECUBITAL  Final   Special Requests   Final    BOTTLES DRAWN AEROBIC AND ANAEROBIC Blood Culture adequate volume   Culture   Final    NO GROWTH 2 DAYS Performed at [facility], [address], [place], [place] [address]    Report Status PENDING  Incomplete  Respiratory Panel by RT PCR (Flu A&B, Covid) - Nasopharyngeal Swab     Status: None   Collection Time: 08/18/20  1:47 PM   Specimen: Nasopharyngeal Swab  Result Value Ref Range Status   SARS Coronavirus 2 by RT PCR NEGATIVE NEGATIVE Final    Comment: (NOTE) SARS-CoV-2 target nucleic acids are NOT DETECTED.  The SARS-CoV-2 RNA is generally detectable in upper respiratoy specimens during the acute phase of infection. The lowest concentration of SARS-CoV-2 viral copies this assay can detect is 131 copies/mL. A negative result does not preclude  SARS-Cov-2 infection and should not be used as the sole basis for treatment or other patient management decisions. A negative result may occur with  improper specimen collection/handling, submission of specimen other than nasopharyngeal swab, presence of viral mutation(s) within the areas targeted by this assay, and inadequate number of viral copies (<131 copies/mL). A negative result must be combined with clinical observations, patient history, and epidemiological information. The expected result is Negative.  Fact Sheet for Patients:  PinkCheek.be  Fact Sheet for Healthcare Providers:  GravelBags.it  This test is no t yet approved or cleared by the [place] FDA and  has been authorized for detection and/or diagnosis of SARS-CoV-2 by FDA under an Emergency  Use Authorization (EUA). This EUA will remain  in effect (meaning this test can be used) for the duration of the COVID-19 declaration under Section 564(b)(1) of the Act, 21 U.S.C. section 360bbb-3(b)(1), unless the authorization is terminated or revoked sooner.     Influenza A by PCR NEGATIVE NEGATIVE Final   Influenza B by PCR NEGATIVE NEGATIVE Final    Comment: (NOTE) The Xpert Xpress SARS-CoV-2/FLU/RSV assay is intended as an aid in  the diagnosis of influenza from Nasopharyngeal swab specimens and  should not be used as a sole basis for treatment. Nasal washings and  aspirates are unacceptable for Xpert Xpress SARS-CoV-2/FLU/RSV  testing.  Fact Sheet for Patients: PinkCheek.be  Fact Sheet for Healthcare Providers: GravelBags.it  This test is not yet approved or cleared by the [place] FDA and  has been authorized for detection and/or diagnosis of SARS-CoV-2 by  FDA under an Emergency Use Authorization (EUA). This EUA will remain  in effect (meaning this test can be used) for the duration of the   Covid-19 declaration under Section 564(b)(1) of the Act, 21  U.S.C. section 360bbb-3(b)(1), unless the authorization is  terminated or revoked. Performed at [name], [address] [place], [place] [address]   Urine Culture     Status: Abnormal (Preliminary result)   Collection Time: 08/18/20  3:51 PM   Specimen: Urine, Random  Result Value Ref Range Status   Specimen Description   Final    URINE, RANDOM Performed at [facility], [address], [place], [place] [address]    Special Requests   Final    NONE Performed at [facility], [address] [place], [place] [address]    Culture (A)  Final    >=100,000 COLONIES/mL PSEUDOMONAS AERUGINOSA >=100,000 COLONIES/mL KLEBSIELLA PNEUMONIAE SUSCEPTIBILITIES TO FOLLOW Performed at [facility], [place] [address], [place], [place] [address]    Report Status PENDING  Incomplete         Radiology Studies: US Venous Img Lower Bilateral (DVT)  Result Date: 08/18/2020 CLINICAL DATA:  Pain swelling EXAM: BILATERAL LOWER EXTREMITY VENOUS DOPPLER ULTRASOUND TECHNIQUE: Gray-scale sonography with compression, as well as color and duplex ultrasound, were performed to evaluate the deep venous system(s) from the level of the common femoral vein through the popliteal and proximal calf veins. COMPARISON:  None. FINDINGS: VENOUS Normal compressibility of the common femoral, superficial femoral, and popliteal veins, as well as the visualized calf veins. Visualized portions of profunda femoral vein and great saphenous vein unremarkable. No filling defects to suggest DVT on grayscale or color Doppler imaging. Doppler waveforms show normal direction of venous flow, normal respiratory plasticity and response to augmentation. OTHER None. Limitations: none IMPRESSION: Negative. Electronically Signed   By: Constance Holster M.D.   On: 08/18/2020 21:20        Scheduled Meds: . aspirin EC  81 mg Oral Daily  .  benztropine  0.5 mg Oral BID  . colesevelam  1,875 mg Oral BID WC  . DULoxetine  30 mg Oral Daily  . feeding supplement (GLUCERNA SHAKE)  237 mL Oral TID BM  . ferrous sulfate  325 mg Oral TID WC  . haloperidol  0.5 mg Oral BID  . heparin  5,000 Units Subcutaneous Q8H  . insulin aspart  0-6 Units Subcutaneous TID AC & HS  . insulin glargine  8 Units Subcutaneous Daily  . megestrol  200 mg Oral Daily  . multivitamin with minerals  1 tablet Oral Daily  .  pantoprazole  80 mg Oral Q1200  . potassium & sodium phosphates  2 packet Oral TID WC & HS  . sodium bicarbonate  1,300 mg Oral BID   Continuous Infusions: . sodium chloride 75 mL/hr at 08/19/20 1314    Assessment & Plan:   Principal Problem:   Weakness Active Problems:   Malnutrition (HCC)   Hyperglycemia   Diabetes mellitus with hyperglycemia (HCC)   Diabetic gastroparesis (HCC)   AKI (acute kidney injury) ([place])   Acute renal failure (ARF) (HCC)   Hypotension   Anemia of chronic disease   Depression   Protein-calorie malnutrition, severe   Acute urinary retention   FTT (failure to thrive) in adult   Pain in right lower leg   Acidosis   Acute lower UTI   Gregory Fines Santiago-Gonzalezis a 44 y.o.malewith medical history significant fordiabetes mellitus insulin-dependent, GERD, gastroparesis, history of DKA, anemia of chronic disease, tobacco abuse, esophageal candidiasis, history of COVID-19 infection on 04/23/2019, history of C. difficile colitis, failure to thrive, presented to the emergency department for chief concerns of generalized weakness and right leg pain x5 days  Patient earlier in the day went to urology office where his suprapubic catheter was exchanged.  Generalized weakness/UTI/chronic suprapubic catheter (exchanged on 08/18/2020) acute on chronic renal failure IIIb (baseline creat 1.5-.18) Acidosis -BP better, will continue IV fluids at times since it drops.   Lactic acid normalized  Urine culture positive  for Pseudomonas and Klebsiella, unsure if this is colonized.   We will DC ceftriaxone as patient has no leukocytosis and remains afebrile  Follow-up sensitivity    Right foot pain -denies any trauma. Uses a cane to walk. -X-ray right foot shows Sclerosis at the base of the second metatarsal which could reflect stress fracture. Follow-up outpatient MRI or three-phase bone scan could be performed if clinical symptoms exist in this region. 2. 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 3. No displaced fractures. 11/10-yesterday ortho was consulted, recommended post op shoe, PT/ and f/u as outpt Pt rec. HH PT   Hypotension chronic Will change midrodrine to standing dose tid instead of prn. Continue ivf for now  Hyponatremia secondary to dehydration and elevated sugars  Na down to 130 today Continue ivf.  Monitor labs  Hyperglycemia in the setting of insulin-dependent diabetes with peripheral vascular disease/CKD stage IIIb -bg stable.  Continue current regimen with isulin.   Anemia of chronic disease Continue with iron   chronic depression continue duloxetine, Haldol, Cogentin  severe protein calorie malnutrition-chronic follow dietary recommendation  history of chronic urinary retention status post suprapubic catheter placement-- chronic -follows with urology as outpatient -catheter exchanged in the office on 18 August 2020.  Chronic diarrhea- on imodium prn  DVT prophylaxis: Heparin subcu Code Status: Full Family Communication: None at bedside  Status is: Inpatient  Remains inpatient appropriate because:IV treatments appropriate due to intensity of illness or inability to take PO   Dispo: The patient is from: Home              Anticipated d/c is to: Home              Anticipated d/c date is: 1 day              Patient currently is not medically stable to d/c.            LOS: 1 day   Time spent: 35 minutes with more than  50% on COC    Nolberto Hanlon, MD [facility]  Pager [phone]  If 7PM-7AM, please contact night-coverage [email or web address] Password [email or web address] 08/20/2020, 3:45 PM

## 2020-08-20 NOTE — Plan of Care (Signed)
  Problem: Education: Goal: Knowledge of General Education information will improve Description: Including pain rating scale, medication(s)/side effects and non-pharmacologic comfort measures Outcome: Progressing   Problem: Health Behavior/Discharge Planning: Goal: Ability to manage health-related needs will improve Outcome: Progressing   Problem: Clinical Measurements: Goal: Ability to maintain clinical measurements within normal limits will improve Outcome: Progressing Goal: Will remain free from infection Outcome: Progressing Goal: Diagnostic test results will improve Outcome: Progressing   Problem: Urinary Elimination: Goal: Signs and symptoms of infection will decrease Outcome: Progressing   Problem: Education: Goal: Ability to describe self-care measures that may prevent or decrease complications (Diabetes Survival Skills Education) will improve Outcome: Progressing Goal: Individualized Educational Video(s) Outcome: Progressing   Problem: Coping: Goal: Ability to adjust to condition or change in health will improve Outcome: Progressing   Problem: Fluid Volume: Goal: Ability to maintain a balanced intake and output will improve Outcome: Progressing   Problem: Health Behavior/Discharge Planning: Goal: Ability to identify and utilize available resources and services will improve Outcome: Progressing Goal: Ability to manage health-related needs will improve Outcome: Progressing   Problem: Metabolic: Goal: Ability to maintain appropriate glucose levels will improve Outcome: Progressing   Problem: Nutritional: Goal: Maintenance of adequate nutrition will improve Outcome: Progressing Goal: Progress toward achieving an optimal weight will improve Outcome: Progressing   Problem: Skin Integrity: Goal: Risk for impaired skin integrity will decrease Outcome: Progressing   Problem: Tissue Perfusion: Goal: Adequacy of tissue perfusion will improve Outcome:  Progressing

## 2020-08-20 NOTE — Progress Notes (Signed)
   08/20/20 0342  Assess: MEWS Score  Temp 98.4 F (36.9 C)  BP (!) 70/50  Pulse Rate 70  Resp 17  Assess: MEWS Score  MEWS Temp 0  MEWS Systolic 2  MEWS Pulse 0  MEWS RR 0  MEWS LOC 0  MEWS Score 2  MEWS Score Color Yellow  Assess: if the MEWS score is Yellow or Red  Were vital signs taken at a resting state? Yes  Focused Assessment Change from prior assessment (see assessment flowsheet)  Early Detection of Sepsis Score *See Row Information* Low  MEWS guidelines implemented *See Row Information* No, other (Comment) (Pt FTT)  Treat  MEWS Interventions Administered scheduled meds/treatments  Pain Scale 0-10  Pain Score 0  Take Vital Signs  Increase Vital Sign Frequency  Yellow: Q 2hr X 2 then Q 4hr X 2, if remains yellow, continue Q 4hrs  Escalate  MEWS: Escalate Yellow: discuss with charge nurse/RN and consider discussing with provider and RRT  Notify: Charge Nurse/RN  Name of Charge Nurse/RN Notified [name], RN  Date Charge Nurse/RN Notified 08/20/20  Time Charge Nurse/RN Notified 0404  Notify: Provider  Provider Name/Title Anne Ng  Date Provider Notified 08/20/20  Time Provider Notified 0406  Notification Type Face-to-face  Notification Reason Change in status  Response See new orders  Date of Provider Response 08/20/20  Time of Provider Response 0410  Notified Provider. Will implement new orders and monitor pt for effectiveness.

## 2020-08-20 NOTE — Progress Notes (Signed)
[facility] for Electrolyte Monitoring and Replacement   Recent Labs: Potassium (mmol/L)  Date Value  08/20/2020 3.8   Magnesium (mg/dL)  Date Value  08/20/2020 1.8   Calcium (mg/dL)  Date Value  08/20/2020 8.0 (L)   Calcium, Total (PTH) (mg/dL)  Date Value  04/17/2017 8.6 (L)   Albumin (g/dL)  Date Value  08/19/2020 3.1 (L)   Phosphorus (mg/dL)  Date Value  08/20/2020 1.8 (L)   Sodium (mmol/L)  Date Value  08/20/2020 130 (L)    Assessment: 44 yo male admitted with weakness, acute renal failure and UTI. Patient has severe chronic protein calorie malnutrition and is high risk for refeeding syndrome. Pharmacy consulted for electrolyte monitoring and replacement.   11/10: Scr improving (2.09 >> 1.62). NAGMA appears to be worsening on oral sodium bicarb. Patient with chronic diarrhea. May be exacerbated by normal saline infusion. Sodium is worse on normal saline. UOP of 2.2L yesterday.   Goal of Therapy:  Electrolytes WNL  Plan:   11/10 Na 130, K 3.8, Mg 1.8, Phos 1.8 --Will order Phos-Nak 2 packets TIDM x 3 doses --Will re-check all electrolytes tomorrow AM  Benita Gutter 08/20/2020 7:51 AM

## 2020-08-20 NOTE — Progress Notes (Signed)
   08/20/20 0342  Vitals  Temp 98.4 F (36.9 C)  Temp Source Oral  BP (!) 70/50  BP Location Right Arm  BP Method Manual  Patient Position (if appropriate) Lying  Pulse Rate 70  Resp 17  MEWS COLOR  MEWS Score Color Yellow  MEWS Score  MEWS Temp 0  MEWS Systolic 2  MEWS Pulse 0  MEWS RR 0  MEWS LOC 0  MEWS Score 2  Provider notified of BP.

## 2020-08-20 NOTE — Evaluation (Signed)
Physical Therapy Evaluation Patient Details Name: Gregory Moody MRN: [id number] DOB: 1976/02/28 Today's Date: 08/20/2020   History of Present Illness  Gregory Moody is a 51yoM who comes [facility] ED on 08/18/20 c progressed RT foot pain nopw unable to walk. Pt noted to have hypotension in ED (58/28mmHg), BG in 600s.  Imaging study of Rt foot suggestive of need for additional imaging as an outpatient per ortho. PMH: chronic hypotension on midodrine, IDDM, GERD, gastroparesis, diarrhea, anemia, tobacco abuse, covid19 (July 2020), diarrhea, C Diff, syncope.  Clinical Impression  Pt admitted with above diagnosis. Pt currently with functional limitations due to the deficits listed below (see "PT Problem List"). Upon entry, pt in bed, awake and agreeable to participate. Pt reports 10/10 pain. The pt is alert and oriented x4, pleasant, conversational, and generally a good historian. Modified independent bed mobility, transfers, and AMB. Pt requires RW for transfers and AMB due to RLE pain, however his movement patterns do not demonstrate any obvious antalgia. Pt was able to AMB >369ft just 3 weeks ago whilst admitted here. Functional mobility assessment demonstrates increased effort/time requirements, poor tolerance, and need for physical assistance, whereas the patient performed these at a higher level of independence PTA. Pt will benefit from skilled PT intervention to increase independence and safety with basic mobility in preparation for discharge to the venue listed below.       Follow Up Recommendations Home health PT    Equipment Recommendations  None recommended by PT    Recommendations for Other Services       Precautions / Restrictions Precautions Precautions: Fall Restrictions Weight Bearing Restrictions: No      Mobility  Bed Mobility Overal bed mobility: Modified Independent                  Transfers Overall transfer level: Modified independent Equipment  used: Rolling walker (2 wheeled)                Ambulation/Gait Ambulation/Gait assistance: Supervision Gait Distance (Feet): 28 Feet Assistive device: Rolling walker (2 wheeled) Gait Pattern/deviations: Step-through pattern;Narrow base of support Gait velocity: increased      Stairs            Wheelchair Mobility    Modified Rankin (Stroke Patients Only)       Balance Overall balance assessment: History of Falls;Modified Independent;No apparent balance deficits (not formally assessed)                                           Pertinent Vitals/Pain Pain Assessment: 0-10 Pain Score: 10-Worst pain ever Pain Location: Right leg from hip to foot Pain Intervention(s): Limited activity within patient's tolerance;Patient requesting pain meds-RN notified    Home Living Family/patient expects to be discharged to:: Private residence Living Arrangements: Children;Parent Available Help at Discharge: Family Type of Home: Mobile home Home Access: Stairs to enter Entrance Stairs-Rails: Psychiatric nurse of Steps: [place]: One level [place]: [place] - 2 wheels      Prior Function Level of Independence: Independent with assistive device(s);Needs assistance   Gait / Transfers Assistance Needed: RW use in home, household AMB  ADL's / Homemaking Assistance Needed: needs assist for medical care        Hand Dominance   Dominant Hand: Right    Extremity/Trunk Assessment   Upper Extremity Assessment Upper Extremity Assessment: Generalized weakness;Overall  WFL for tasks assessed    Lower Extremity Assessment Lower Extremity Assessment: Generalized weakness;Overall [facility] for tasks assessed    Cervical / Trunk Assessment Cervical / Trunk Assessment: Kyphotic  Communication   Communication: Prefers language other than English  Cognition Arousal/Alertness: Awake/alert Behavior During Therapy: WFL for tasks  assessed/performed Overall Cognitive Status: Within Functional Limits for tasks assessed                                        General Comments      Exercises     Assessment/Plan    PT Assessment Patient needs continued PT services  PT Problem List Decreased strength;Decreased activity tolerance       PT Treatment Interventions Gait training;Therapeutic activities;Therapeutic exercise    PT Goals (Current goals can be found in the Care Plan section)  Acute Rehab PT Goals Patient Stated Goal: resolve pain in RLE PT Goal Formulation: With patient Time For Goal Achievement: 09/03/20 Potential to Achieve Goals: Poor    Frequency Min 2X/week   Barriers to discharge        Co-evaluation               AM-PAC PT "6 Clicks" Mobility  Outcome Measure Help needed turning from your back to your side while in a flat bed without using bedrails?: None Help needed moving from lying on your back to sitting on the side of a flat bed without using bedrails?: None Help needed moving to and from a bed to a chair (including a wheelchair)?: None Help needed standing up from a chair using your arms (e.g., wheelchair or bedside chair)?: None Help needed to walk in hospital room?: A Little Help needed climbing 3-5 steps with a railing? : A Little 6 Click Score: 22    End of Session Equipment Utilized During Treatment: Gait belt Activity Tolerance: Patient limited by pain;Patient tolerated treatment well Patient left: in bed;with call bell/phone within reach;with bed alarm set Nurse Communication: Mobility status PT Visit Diagnosis: Muscle weakness (generalized) (M62.81);Difficulty in walking, not elsewhere classified (R26.2)    Time: 2811-8867 PT Time Calculation (min) (ACUTE ONLY): 18 min   Charges:   PT Evaluation $PT Eval Moderate Complexity: 1 Mod          1:04 PM, 08/20/20 Etta Grandchild, PT, DPT Physical Therapist - [facility]  [phone] ([place])   [place] 08/20/2020, 1:01 PM

## 2020-08-21 LAB — BASIC METABOLIC PANEL
Anion gap: 6 (ref 5–15)
BUN: 24 mg/dL — ABNORMAL HIGH (ref 6–20)
CO2: 14 mmol/L — ABNORMAL LOW (ref 22–32)
Calcium: 8.2 mg/dL — ABNORMAL LOW (ref 8.9–10.3)
Chloride: 111 mmol/L (ref 98–111)
Creatinine, Ser: 1.77 mg/dL — ABNORMAL HIGH (ref 0.61–1.24)
GFR, Estimated: 48 mL/min — ABNORMAL LOW (ref >60)
Glucose, Bld: 178 mg/dL — ABNORMAL HIGH (ref 70–99)
Potassium: 4 mmol/L (ref 3.5–5.1)
Sodium: 131 mmol/L — ABNORMAL LOW (ref 135–145)

## 2020-08-21 LAB — URINE CULTURE: Culture: >=100000 — AB

## 2020-08-21 LAB — GLUCOSE, CAPILLARY
Glucose-Capillary: 211 mg/dL — ABNORMAL HIGH (ref 70–99)
Glucose-Capillary: 217 mg/dL — ABNORMAL HIGH (ref 70–99)
Glucose-Capillary: 232 mg/dL — ABNORMAL HIGH (ref 70–99)
Glucose-Capillary: 250 mg/dL — ABNORMAL HIGH (ref 70–99)

## 2020-08-21 LAB — MAGNESIUM: Magnesium: 1.9 mg/dL (ref 1.7–2.4)

## 2020-08-21 LAB — PHOSPHORUS: Phosphorus: 2.4 mg/dL — ABNORMAL LOW (ref 2.5–4.6)

## 2020-08-21 MED ORDER — MIDODRINE HCL 5 MG PO TABS
2.5000 mg | ORAL_TABLET | Freq: Three times a day (TID) | ORAL | Status: DC
  Administered 2020-08-21 – 2020-08-23 (×7): 2.5 mg via ORAL
  Filled 2020-08-21 (×7): qty 1

## 2020-08-21 MED ORDER — MIDODRINE HCL 2.5 MG PO TABS: 2.5000 mg | ORAL_TABLET | Freq: Three times a day (TID) | ORAL | 0 refills | Status: AC

## 2020-08-21 MED ORDER — MIDODRINE HCL 5 MG PO TABS: 5.0000 mg | ORAL_TABLET | Freq: Three times a day (TID) | ORAL | Status: DC

## 2020-08-21 NOTE — Progress Notes (Signed)
Patient is being discharged home today. DC & Rx instructions given and patient acknowledged understanding. Taxi voucher supplied to patient. Answered patient's questions. Glucose monitoring system given to patient and he said he understand how to use. IV removed, belongings packed. NT will help patient to get ready for discharge.

## 2020-08-21 NOTE — Progress Notes (Signed)
Inpatient Diabetes Program Recommendations  AACE/ADA: New Consensus Statement on Inpatient Glycemic Control (2015)  Target Ranges:  Prepandial:   less than 140 mg/dL      Peak postprandial:   less than 180 mg/dL (1-2 hours)      Critically ill patients:  140 - 180 mg/dL   Lab Results  Component Value Date   GLUCAP 217 (H) 08/21/2020   HGBA1C 8.5 (H) 07/26/2020    Review of Glycemic Control Results for Gregory Moody, Gregory Moody (MRN [id number]) as of 08/21/2020 12:02  Ref. Range 08/20/2020 16:48 08/20/2020 21:05 08/21/2020 07:50 08/21/2020 11:36  Glucose-Capillary Latest Ref Range: 70 - 99 mg/dL 135 (H) 239 (H) 211 (H) 217 (H)   Diabetes history: DM1 Outpatient Diabetes medications: Lantus 8 units  Current orders for Inpatient glycemic control: Lantus 8 units qd + Novolog 0-6 units qid correction  Inpatient Diabetes Program Recommendations:   If CBGs continue > 200, Please consider: -Increase Lantus to 10 units daily -Add Novolog 2 units tid meal coverage if eats 50% Secure chat sent to Dr. Kurtis Bushman.    Thank you, [name]. [name], RN, MSN, CDE  Diabetes Coordinator Inpatient Glycemic Control Team Team Pager [phone] (8am-5pm) 08/21/2020 12:03 PM

## 2020-08-21 NOTE — TOC Initial Note (Signed)
Transition of Care [facility]) - Initial/Assessment Note    Patient Details  Name: Gregory Moody MRN: [id number] Date of Birth: Oct 25, 1975  [facility] CM/SW Contact:    Shelbie Ammons, RN Phone Number: 08/21/2020, 10:25 AM  Clinical Narrative:    RNCM completed high risk assessment. Patient was here about 3 weeks ago and it was attempted to set up home health at that time without success. Patient still lives in mobile home with his parents and he uses his walker to ambulate. Patient reports no changes in his living situation or his MD. He was provided with a glucometer and supplies at last admission.  RNCM reached out to [place] with [facility] to see if they can accept for home health but she reports at this time they do not have any availability. RNCM reached out to [place] with [facility] to see if they have availability but they do not.                Expected Discharge Plan: [place]     Patient Goals and CMS Choice Patient states their goals for this hospitalization and ongoing recovery are:: "I want to feel better"      Expected Discharge Plan and Services Expected Discharge Plan: [place] Acute Care Choice: [place] arrangements for the past 2 months: Mobile Home                                      Prior Living Arrangements/Services Living arrangements for the past 2 months: Mobile Home Lives with:: Parents Patient language and need for interpreter reviewed:: Yes Do you feel safe going back to the place where you live?: Yes      Need for Family Participation in Patient Care: Yes (Comment) Care giver support system in place?: Yes (comment)   Criminal Activity/Legal Involvement Pertinent to Current Situation/Hospitalization: No - Comment as needed  Activities of Daily Living Home Assistive Devices/Equipment: Cane (specify quad or straight) (quad) ADL Screening (condition at time of  admission) Patient's cognitive ability adequate to safely complete daily activities?: Yes Is the patient deaf or have difficulty hearing?: No Does the patient have difficulty seeing, even when wearing glasses/contacts?: No Does the patient have difficulty concentrating, remembering, or making decisions?: No Patient able to express need for assistance with ADLs?: Yes Does the patient have difficulty dressing or bathing?: No Independently performs ADLs?: No Communication: Independent Dressing (OT): Independent Grooming: Independent Feeding: Independent Bathing: Independent Toileting: Independent In/Out Bed: Needs assistance Is this a change from baseline?: Pre-admission baseline Walks in Home: Needs assistance Is this a change from baseline?: Pre-admission baseline Does the patient have difficulty walking or climbing stairs?: Yes Weakness of Legs: Both Weakness of Arms/Hands: Both  Permission Sought/Granted                  Emotional Assessment       Orientation: : Oriented to Self, Oriented to Place, Oriented to  Time, Oriented to Situation Alcohol / Substance Use: Not Applicable Psych Involvement: No (comment)  Admission diagnosis:  Weakness [R53.1] Hyperglycemia [R73.9] Pain in right lower leg [M79.661] Hypotension due to hypovolemia [I95.89, E86.1] Patient Active Problem List   Diagnosis Date Noted  . Pain in right lower leg   . Acidosis   . Acute lower UTI   . Sepsis secondary to UTI ([place])  06/12/2020  . E-coli UTI 06/12/2020  . UTI due to Klebsiella species 06/12/2020  . Hypothermia   . Hyponatremia   . Encephalopathy 06/09/2020  . Neurogenic bladder   . Penile abscess 05/06/2020  . FTT (failure to thrive) in adult 05/03/2020  . Weakness 05/02/2020  . CKD (chronic kidney disease), stage IV ([place]) 05/02/2020  . Type 1 diabetes mellitus with diabetic chronic kidney disease ([place])   . Goals of care, counseling/discussion   . Palliative care by specialist   .  Recurrent major depression-severe ([place]) 03/31/2020  . Hyperkalemia   . Iron deficiency anemia   . Weight loss   . Chronic diarrhea 02/07/2020  . Abnormal CT of the chest   . Acute urinary retention 02/06/2020  . Hyperglycemia due to type 2 diabetes mellitus ([place]) 02/06/2020  . History of Clostridium difficile colitis 02/06/2020  . Generalized weakness 02/06/2020  . History of COVID-19 02/06/2020  . Personal history of other infectious and parasitic diseases 02/06/2020  . Asthenia 02/06/2020  . Diarrhea 01/21/2020  . Tobacco abuse 01/21/2020  . C. difficile colitis 01/21/2020  . Urinary retention 01/21/2020  . Diabetes mellitus without complication ([place]) 23/53/6144  . Protein-calorie malnutrition, severe 01/02/2020  . Odynophagia   . DKA (diabetic ketoacidoses) 12/27/2019  . Depression 12/27/2019  . DKA, type 2 ([place]) 12/27/2019  . Anemia of chronic disease 04/25/2019  . Hypomagnesemia 04/25/2019  . Hypotension 04/23/2019  . CAP (community acquired pneumonia) due to MSSA (methicillin sensitive Staphylococcus aureus) ([place]) 04/14/2019  . COVID-19 virus infection 04/14/2019  . Pressure injury of skin 03/31/2019  . Acute renal failure (ARF) ([place]) 03/06/2019  . CAP (community acquired pneumonia) 02/14/2019  . AKI (acute kidney injury) ([place]) 02/02/2019  . Acute kidney failure, unspecified ([place]) 02/02/2019  . ARF (acute renal failure) ([place]) 01/03/2019  . Malnutrition of moderate degree 02/24/2018  . GERD (gastroesophageal reflux disease) 02/23/2018  . Diabetic gastroparesis ([place]) 02/23/2018  . Diabetic foot ulcer ([place]) 02/23/2018  . Aspiration pneumonia ([place]) 04/21/2017  . Hypoglycemia 04/21/2017  . Diabetes mellitus with hyperglycemia ([place]) 04/21/2017  . Hip fracture, unspecified laterality, closed, initial encounter ([place]) 04/17/2017  . Hyperglycemia 04/17/2017  . Anisocoria 02/26/2017  . Adjustment disorder with depressed mood 07/02/2016  . Hypocalcemia 07/02/2016  . Personal  history of tuberculosis 07/02/2016  . Malnutrition ([place]) 04/09/2016  . Cavitary lesion of lung 04/06/2016  . Other disorders of lung 04/06/2016   PCP:  [facility]   [place], [place] - [address] [address] Phone: [phone] Fax: [phone]  [facility] [address] ([address]), [place] - [place]) [place] [address] Phone: [phone] Fax: [phone]  [facility] [place], [address] [place] [address] Phone: [phone] Fax: [phone]     Social Determinants of Health (SDOH) Interventions    Readmission Risk Interventions Readmission Risk Prevention Plan 08/21/2020 07/28/2020 05/09/2020  Transportation Screening Complete Complete Complete  PCP or Specialist Appt within 3-5 Days - - -  Social Work Consult for [place] - - -  Medication Review Press photographer) Complete Complete Complete  PCP or Specialist appointment within 3-5 days of discharge Complete Complete -  [place] or Home Care Consult Complete Complete -  SW Recovery Care/Counseling Consult Complete Complete -  Palliative Care Screening Not Applicable Not  Applicable -  [place] Not Applicable Not Applicable Not Applicable  Some recent data might be hidden

## 2020-08-21 NOTE — Progress Notes (Signed)
[place] for Electrolyte Monitoring and Replacement   Recent Labs: Potassium (mmol/L)  Date Value  08/21/2020 4.0   Magnesium (mg/dL)  Date Value  08/21/2020 1.9   Calcium (mg/dL)  Date Value  08/21/2020 8.2 (L)   Calcium, Total (PTH) (mg/dL)  Date Value  04/17/2017 8.6 (L)   Albumin (g/dL)  Date Value  08/19/2020 3.1 (L)   Phosphorus (mg/dL)  Date Value  08/21/2020 2.4 (L)   Sodium (mmol/L)  Date Value  08/21/2020 131 (L)    Assessment: 44 yo male admitted with weakness, acute renal failure and UTI. Patient has severe chronic protein calorie malnutrition and is high risk for refeeding syndrome. Pharmacy consulted for electrolyte monitoring and replacement.   11/11: Scr slightly worse (1.62 >> 1.77). NAGMA appears to be worsening despite oral sodium bicarb. Patient with chronic diarrhea. Sodium is slightly improved. UOP of 2.8L yesterday.   Goal of Therapy:  Electrolytes WNL  Plan:   11/10 Na 131, K 4, Mg 1.9, Phos 2.4 --Phosphorous only slightly low. Enteral phosphorous likely exacerbating diarrhea. Will hold off on replacement today --Will re-check all electrolytes tomorrow AM  Benita Gutter 08/21/2020 8:06 AM

## 2020-08-21 NOTE — TOC Progression Note (Signed)
Transition of Care [facility]) - Progression Note    Patient Details  Name: Gregory Moody MRN: [id number] Date of Birth: Jan 24, 1976  Transition of [facility]) CM/SW Contact  Shelbie Ammons, RN Phone Number: 08/21/2020, 3:45 PM  Clinical Narrative:   Patient refusing discharge at this time, reports that he doesn't feel he is well enough to leave and would not be able to get into cab. MD and bedside nurse made aware. Patient will remain in hospital until tomorrow.     Expected Discharge Plan: [place]    Expected Discharge Plan and Services Expected Discharge Plan: [place] Choice: [place] arrangements for the past 2 months: Mobile Home Expected Discharge Date: 08/21/20                                     Social Determinants of Health (SDOH) Interventions    Readmission Risk Interventions Readmission Risk Prevention Plan 08/21/2020 07/28/2020 05/09/2020  Transportation Screening Complete Complete Complete  PCP or Specialist Appt within 3-5 Days - - -  Social Work Consult for [place] - - -  Medication Review Press photographer) Complete Complete Complete  PCP or Specialist appointment within 3-5 days of discharge Complete Complete -  [place] or Home Care Consult Complete Complete -  SW Recovery Care/Counseling Consult Complete Complete -  Palliative Care Screening Not Applicable Not Applicable -  [place] Not Applicable Not Applicable Not Applicable  Some recent data might be hidden

## 2020-08-21 NOTE — Discharge Summary (Addendum)
Gregory Moody LPF:[id number] DOB: 05-16-1976 DOA: 08/18/2020  PCP: [facility] date: 08/18/2020 Discharge date: 08/23/20 Admitted From: home Disposition:  home  Recommendations for Outpatient Follow-up:  1. Follow up with PCP in 1 week 2. Please obtain BMP/CBC in one week 3. Dr. Harlow Mares orthopedics in one week     Discharge Condition:Stable CODE STATUS: Full Diet recommendation: Carb controlled , but patient Is noncompliant   Brief/Interim Summary: Per HPI: Gregory Moody is a 44 y.o. male with medical history significant for diabetes mellitus insulin-dependent, GERD, gastroparesis, history of DKA, anemia of chronic disease, tobacco abuse, esophageal candidiasis, history of COVID-19 infection on 04/23/2019, history of C. difficile colitis, failure to thrive, presented to the emergency department for chief concerns of generalized weakness and right leg pain.He reported the weakness and leg pain that started about 4-5 days   He states the pain on right lower extremity starts from foot to hip.  He denied trauma.  He reported that he has not tried anything to make it better.  Also, Patient earlier in the day went to urology office where his suprapubic catheter was exchanged. On admission UA was positive was started on abx. However since no leukocytosis, afebrile, or urinary sx, and UA positive after catheter exchanged, antibiotic was discontinued likely due to colonization.  He had normal lactic acid.  He was not hypotensive.  Afebrile.  Also he does have at times chronic hypotension in the past due to diarrhea decreased p.o. intake which usually improves with IV fluids.  His urine culture did grow Klebsiella and Pseudomonas however again this was not treated due to colonization.   He had a venous ultrasound which was negative for DVT.  Foot x-ray reveals sclerosis at the base of the second metatarsal which could reflect stress fracture. Follow-up outpatient  MRI or three-phase bone scan could be performed if clinical symptoms exist in this region.2. 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 3. No displaced fractures. Orthopedics was consulted.  Dr. Harlow Mares evaluated the patient.  Right foot pain/weakness -denies any trauma. Uses a cane to walk. Xray as above. Ortho recommended outpatient f/u. Post op shoe, PT.    Hypotension chronic On midrodrine tid  Hyponatremia secondary to dehydration and elevated sugars Patients Na improved with ivf and controlling his sugar levels. Unfortunetly during his hospitalization , he was noncompliant with his diet, eating icecream and other sweets, with sugars kept increasing. Asked patient to stop eating such things and he responded "you can give me more insulin whats the problem". He refused to give up the foods that was causing his sugars to go up , was hiding them in bed. On the day of discharge he decided to eat ice cream in am.   Hyperglycemia in the setting of insulin-dependent diabetes with peripheral vascular disease/CKD stage IIIb Continue with insulin and home Discussed with patient about being compliant with checking his sugars and his insulin   Anemia of chronic disease Continue with iron   chronic depression continue duloxetine, Haldol, Cogentin  severe protein calorie malnutrition-chronic follow dietary recommendation  history of chronic urinary retention status post suprapubic catheter placement--chronic -follows with urology as outpatient -catheter exchanged in the office on 18 August 2020.  Chronic diarrhea- none here, but has  imodium prn   Discharge Diagnoses:  Principal Problem:   Weakness Active Problems:   Malnutrition ([place])   Hyperglycemia   Diabetes mellitus with hyperglycemia (H[facility])   Diabetic gastroparesis ([place])   AKI (acute kidney injury) ([place])  Acute renal failure (ARF) (HCC)   Hypotension   Anemia of chronic disease    Depression   Protein-calorie malnutrition, severe   Acute urinary retention   FTT (failure to thrive) in adult   Pain in right lower leg   Acidosis   Acute lower UTI    Discharge Instructions  Discharge Instructions    Call MD for:  temperature >100.4   Complete by: As directed    Diet - low sodium heart healthy   Complete by: As directed    Increase activity slowly   Complete by: As directed    No wound care   Complete by: As directed      Allergies as of 08/21/2020   No Known Allergies     Medication List    TAKE these medications   aspirin EC 81 MG tablet Take 81 mg by mouth daily.   benztropine 0.5 MG tablet Commonly known as: COGENTIN Take 1 tablet (0.5 mg total) by mouth 2 (two) times daily.   colesevelam 625 MG tablet Commonly known as: WELCHOL Take 3 tablets (1,875 mg total) by mouth 2 (two) times daily with a meal.   DULoxetine 30 MG capsule Commonly known as: CYMBALTA Take 1 capsule (30 mg total) by mouth daily.   feeding supplement (GLUCERNA SHAKE) Liqd Take 237 mLs by mouth 3 (three) times daily between meals.   ferrous sulfate 325 (65 FE) MG EC tablet Take 1 tablet (325 mg total) by mouth 3 (three) times daily with meals.   haloperidol 0.5 MG tablet Commonly known as: HALDOL Take 1 tablet (0.5 mg total) by mouth 2 (two) times daily.   insulin glargine 100 UNIT/ML injection Commonly known as: LANTUS Inject 0.08 mLs (8 Units total) into the skin daily.   loperamide 2 MG capsule Commonly known as: IMODIUM Take 1 capsule (2 mg total) by mouth 4 (four) times daily as needed for diarrhea or loose stools.   megestrol 400 MG/10ML suspension Commonly known as: MEGACE Take 5 mLs (200 mg total) by mouth daily.   midodrine 2.5 MG tablet Commonly known as: PROAMATINE Take 1 tablet (2.5 mg total) by mouth 3 (three) times daily with meals.   multivitamin with minerals Tabs tablet Take 1 tablet by mouth daily.   NexIUM 40 MG capsule Generic  drug: esomeprazole Take 1 capsule (40 mg total) by mouth daily.   patiromer 8.4 g packet Commonly known as: VELTASSA Take 1 packet (8.4 g total) by mouth daily.   sodium bicarbonate 650 MG tablet Take 2 tablets (1,300 mg total) by mouth 2 (two) times daily.       Follow-up [facility] Follow up in 5 day(s).   Why: Please follow up within 3-5 days of discharge Contact information: [address] [place] [address] [phone]        Lovell Sheehan, MD Follow up in 1 week(s).   Specialty: Orthopedic Surgery Contact information: [place] [address] [phone]              No Known Allergies  Consultations:  Orthopedics   Procedures/Studies: DG Chest 1 View  Result Date: 07/26/2020 CLINICAL DATA:  Chills and frequent falls. Low blood pressure today. History of COVID-19 infection. EXAM: CHEST  1 VIEW COMPARISON:  06/09/2020. FINDINGS: Masslike opacity in the right upper lobe bordering the minor fissure, stable from the prior exam and from a CT dated 12/29/2019. Remainder of the lungs is clear. No  pleural effusion or pneumothorax. Cardiac silhouette is normal in size. Normal mediastinal and hilar contours. Skeletal structures are grossly intact. IMPRESSION: 1. No acute cardiopulmonary disease. 2. Stable masslike opacity in the right upper lobe adjacent to the minor fissure. Although stable since the more recent prior radiographs and a chest CT dated 12/29/2019, the finding is new from a more remote chest radiograph dated 08/04/2015. Neoplasm is not excluded. Electronically Signed   By: Lajean Manes M.D.   On: 07/26/2020 13:18   DG Lumbar Spine 2-3 Views  Result Date: 07/26/2020 CLINICAL DATA:  Patient fell yesterday. Presents with low back pain. EXAM: LUMBAR SPINE - 2-3 VIEW COMPARISON:  None. FINDINGS: No fracture. Grade 1 anterolisthesis of L5 on S1.  No other spondylolisthesis. Disc spaces are well  preserved.  Soft tissues are unremarkable. IMPRESSION: 1. No fracture or acute finding. 2. Grade 1 anterolisthesis of L5 on S1. This is likely degenerative in origin. No defined pars defects. Electronically Signed   By: Lajean Manes M.D.   On: 07/26/2020 13:20   US Venous Img Lower Bilateral (DVT)  Result Date: 08/18/2020 CLINICAL DATA:  Pain swelling EXAM: BILATERAL LOWER EXTREMITY VENOUS DOPPLER ULTRASOUND TECHNIQUE: Gray-scale sonography with compression, as well as color and duplex ultrasound, were performed to evaluate the deep venous system(s) from the level of the common femoral vein through the popliteal and proximal calf veins. COMPARISON:  None. FINDINGS: VENOUS Normal compressibility of the common femoral, superficial femoral, and popliteal veins, as well as the visualized calf veins. Visualized portions of profunda femoral vein and great saphenous vein unremarkable. No filling defects to suggest DVT on grayscale or color Doppler imaging. Doppler waveforms show normal direction of venous flow, normal respiratory plasticity and response to augmentation. OTHER None. Limitations: none IMPRESSION: Negative. Electronically Signed   By: Constance Holster M.D.   On: 08/18/2020 21:20   DG Chest Portable 1 View  Result Date: 08/18/2020 CLINICAL DATA:  Right foot pain starting 5 days ago. EXAM: PORTABLE CHEST 1 VIEW COMPARISON:  None. FINDINGS: Right nodular opacity peripherally along the minor fissure is stable and was evaluated by CT on 12/29/2019 as likely scarring related. No focal consolidation in the left lung. No pneumothorax or pleural effusion. The cardiopericardial silhouette is within normal limits for size. The visualized bony structures of the thorax show no acute abnormality. Telemetry leads overlie the chest. IMPRESSION: Stable.  No new or acute interval findings. Electronically Signed   By: Misty Stanley M.D.   On: 08/18/2020 14:27   DG Foot Complete Right  Result Date:  08/18/2020 CLINICAL DATA:  Right foot pain for 5 days, inability to ambulate EXAM: RIGHT FOOT COMPLETE - 3+ VIEW COMPARISON:  02/22/2018 FINDINGS: Frontal, oblique, and lateral views of the right foot are obtained. There is subtle sclerosis at the base of the second metatarsal. This could reflect stress reaction. No displaced fractures. 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 Mild joint space narrowing and osteophyte formation within the midfoot. Soft tissues are unremarkable. IMPRESSION: 1. Sclerosis at the base of the second metatarsal which could reflect stress fracture. Follow-up outpatient MRI or three-phase bone scan could be performed if clinical symptoms exist in this region. 2. 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 3. No displaced fractures. Electronically Signed   By: Randa Ngo M.D.   On: 08/18/2020 15:13       Subjective: No new complaints.  Discharge Exam: Vitals:   08/21/20 0425 08/21/20 1134  BP: 109/81 127/89  Pulse: 83  81  Resp: 15 16  Temp: 97.8 F (36.6 C) 98.5 F (36.9 C)  SpO2: 100% 100%   Vitals:   08/20/20 2101 08/20/20 2322 08/21/20 0425 08/21/20 1134  BP: 113/84 (!) 142/98 109/81 127/89  Pulse: 83 79 83 81  Resp: 16 16 15 16   Temp: 98.2 F (36.8 C) 97.9 F (36.6 C) 97.8 F (36.6 C) 98.5 F (36.9 C)  TempSrc: Oral Oral  Oral  SpO2: 100% 100% 100% 100%  Weight:        General: Pt is alert, awake, not in acute distress Cardiovascular: RRR, S1/S2 +, no rubs, no gallops Respiratory: CTA bilaterally, no wheezing, no rhonchi Abdominal: Soft, NT, ND, bowel sounds + Extremities: no edema, no cyanosis    The results of significant diagnostics from this hospitalization (including imaging, microbiology, ancillary and laboratory) are listed below for reference.     Microbiology: Recent Results (from the past 240 hour(s))  Culture, blood (routine x 2)     Status: None (Preliminary result)   Collection  Time: 08/18/20  1:21 PM   Specimen: BLOOD  Result Value Ref Range Status   Specimen Description BLOOD BLOOD LEFT FOREARM  Final   Special Requests   Final    BOTTLES DRAWN AEROBIC AND ANAEROBIC Blood Culture results may not be optimal due to an inadequate volume of blood received in culture bottles   Culture   Final    NO GROWTH 3 DAYS Performed at [facility], [address] [place], [place] [address]    Report Status PENDING  Incomplete  Culture, blood (routine x 2)     Status: None (Preliminary result)   Collection Time: 08/18/20  1:47 PM   Specimen: BLOOD  Result Value Ref Range Status   Specimen Description BLOOD RIGHT ANTECUBITAL  Final   Special Requests   Final    BOTTLES DRAWN AEROBIC AND ANAEROBIC Blood Culture adequate volume   Culture   Final    NO GROWTH 3 DAYS Performed at [facility], [address], [place], [place] [address]    Report Status PENDING  Incomplete  Respiratory Panel by RT PCR (Flu A&B, Covid) - Nasopharyngeal Swab     Status: None   Collection Time: 08/18/20  1:47 PM   Specimen: Nasopharyngeal Swab  Result Value Ref Range Status   SARS Coronavirus 2 by RT PCR NEGATIVE NEGATIVE Final    Comment: (NOTE) SARS-CoV-2 target nucleic acids are NOT DETECTED.  The SARS-CoV-2 RNA is generally detectable in upper respiratoy specimens during the acute phase of infection. The lowest concentration of SARS-CoV-2 viral copies this assay can detect is 131 copies/mL. A negative result does not preclude SARS-Cov-2 infection and should not be used as the sole basis for treatment or other patient management decisions. A negative result may occur with  improper specimen collection/handling, submission of specimen other than nasopharyngeal swab, presence of viral mutation(s) within the areas targeted by this assay, and inadequate number of viral copies (<131 copies/mL). A negative result must be combined with clinical observations, patient  history, and epidemiological information. The expected result is Negative.  Fact Sheet for Patients:  PinkCheek.be  Fact Sheet for Healthcare Providers:  GravelBags.it  This test is no t yet approved or cleared by the [place] FDA and  has been authorized for detection and/or diagnosis of SARS-CoV-2 by FDA under an Emergency Use Authorization (EUA). This EUA will remain  in effect (meaning this test can be used) for the duration of the  COVID-19 declaration under Section 564(b)(1) of the Act, 21 U.S.C. section 360bbb-3(b)(1), unless the authorization is terminated or revoked sooner.     Influenza A by PCR NEGATIVE NEGATIVE Final   Influenza B by PCR NEGATIVE NEGATIVE Final    Comment: (NOTE) The Xpert Xpress SARS-CoV-2/FLU/RSV assay is intended as an aid in  the diagnosis of influenza from Nasopharyngeal swab specimens and  should not be used as a sole basis for treatment. Nasal washings and  aspirates are unacceptable for Xpert Xpress SARS-CoV-2/FLU/RSV  testing.  Fact Sheet for Patients: PinkCheek.be  Fact Sheet for Healthcare Providers: GravelBags.it  This test is not yet approved or cleared by the [place] FDA and  has been authorized for detection and/or diagnosis of SARS-CoV-2 by  FDA under an Emergency Use Authorization (EUA). This EUA will remain  in effect (meaning this test can be used) for the duration of the  Covid-19 declaration under Section 564(b)(1) of the Act, 21  U.S.C. section 360bbb-3(b)(1), unless the authorization is  terminated or revoked. Performed at [facility], [address] [place], [place] [address]   Urine Culture     Status: Abnormal   Collection Time: 08/18/20  3:51 PM   Specimen: Urine, Random  Result Value Ref Range Status   Specimen Description   Final    URINE, RANDOM Performed at [facility], [address] [place], [place] [address]    Special Requests   Final    NONE Performed at [facility], [place],  [address]    Culture (A)  Final    >=100,000 COLONIES/mL PSEUDOMONAS AERUGINOSA >=100,000 COLONIES/mL KLEBSIELLA PNEUMONIAE    Report Status 08/21/2020 FINAL  Final   Organism ID, Bacteria PSEUDOMONAS AERUGINOSA (A)  Final   Organism ID, Bacteria KLEBSIELLA PNEUMONIAE (A)  Final      Susceptibility   Klebsiella pneumoniae - MIC*    AMPICILLIN >=32 RESISTANT Resistant     CEFAZOLIN <=4 SENSITIVE Sensitive     CEFEPIME <=0.12 SENSITIVE Sensitive     CEFTRIAXONE <=0.25 SENSITIVE Sensitive     CIPROFLOXACIN <=0.25 SENSITIVE Sensitive     GENTAMICIN <=1 SENSITIVE Sensitive     IMIPENEM <=0.25 SENSITIVE Sensitive     NITROFURANTOIN 64 INTERMEDIATE Intermediate     TRIMETH/SULFA <=20 SENSITIVE Sensitive     AMPICILLIN/SULBACTAM 8 SENSITIVE Sensitive     PIP/TAZO <=4 SENSITIVE Sensitive     * >=100,000 COLONIES/mL KLEBSIELLA PNEUMONIAE   Pseudomonas aeruginosa - MIC*    CEFTAZIDIME 2 SENSITIVE Sensitive     CIPROFLOXACIN <=0.25 SENSITIVE Sensitive     GENTAMICIN <=1 SENSITIVE Sensitive     IMIPENEM 1 SENSITIVE Sensitive     PIP/TAZO <=4 SENSITIVE Sensitive     CEFEPIME 2 SENSITIVE Sensitive     * >=100,000 COLONIES/mL PSEUDOMONAS AERUGINOSA     Labs: BNP (last 3 results) No results for input(s): BNP in the last 8760 hours. Basic Metabolic Panel: Recent Labs  Lab 08/18/20 1317 08/18/20 1551 08/19/20 0014 08/20/20 0525 08/21/20 0522  NA 125*  --  135 130* 131*  K 4.8  --  3.9 3.8 4.0  CL 96*  --  108 110 111  CO2 18*  --  18* 15* 14*  GLUCOSE 624*  --  298* 142* 178*  BUN 36*  --  37* 27* 24*  CREATININE 2.69*  --  2.09* 1.62* 1.77*  CALCIUM 8.6*  --  8.1* 8.0* 8.2*  MG  --   --   --  1.8 1.9  PHOS  --  2.5  --  1.8* 2.4*   Liver Function Tests: Recent Labs  Lab 08/18/20 1317 08/19/20 0014  AST 12* 12*  ALT 14  12  ALKPHOS 74 80  BILITOT 0.5 0.4  PROT 6.9 6.4*  ALBUMIN 3.2* 3.1*   Recent Labs  Lab 08/18/20 1317  LIPASE 58*   No results for input(s): AMMONIA in the last 168 hours. CBC: Recent Labs  Lab 08/18/20 1317 08/19/20 0014  WBC 6.3 7.5  NEUTROABS 4.5  --   HGB 8.4* 8.0*  HCT 26.7* 23.8*  MCV 98.9 94.1  PLT 379 344   Cardiac Enzymes: No results for input(s): CKTOTAL, CKMB, CKMBINDEX, TROPONINI in the last 168 hours. BNP: Invalid input(s): POCBNP CBG: Recent Labs  Lab 08/20/20 1205 08/20/20 1648 08/20/20 2105 08/21/20 0750 08/21/20 1136  GLUCAP 176* 135* 239* 211* 217*   D-Dimer No results for input(s): DDIMER in the last 72 hours. Hgb A1c No results for input(s): HGBA1C in the last 72 hours. Lipid Profile No results for input(s): CHOL, HDL, LDLCALC, TRIG, CHOLHDL, LDLDIRECT in the last 72 hours. Thyroid function studies Recent Labs    08/18/20 1551  TSH 0.604   Anemia work up No results for input(s): VITAMINB12, FOLATE, FERRITIN, TIBC, IRON, RETICCTPCT in the last 72 hours. Urinalysis    Component Value Date/Time   COLORURINE YELLOW (A) 08/18/2020 1551   APPEARANCEUR TURBID (A) 08/18/2020 1551   LABSPEC 1.015 08/18/2020 1551   PHURINE 5.0 08/18/2020 1551   GLUCOSEU >=500 (A) 08/18/2020 1551   HGBUR MODERATE (A) 08/18/2020 1551   BILIRUBINUR NEGATIVE 08/18/2020 1551   KETONESUR NEGATIVE 08/18/2020 1551   PROTEINUR 100 (A) 08/18/2020 1551   NITRITE POSITIVE (A) 08/18/2020 1551   LEUKOCYTESUR LARGE (A) 08/18/2020 1551   Sepsis Labs Invalid input(s): PROCALCITONIN,  WBC,  LACTICIDVEN Microbiology Recent Results (from the past 240 hour(s))  Culture, blood (routine x 2)     Status: None (Preliminary result)   Collection Time: 08/18/20  1:21 PM   Specimen: BLOOD  Result Value Ref Range Status   Specimen Description BLOOD BLOOD LEFT FOREARM  Final   Special Requests   Final    BOTTLES DRAWN AEROBIC AND ANAEROBIC Blood Culture results may not be  optimal due to an inadequate volume of blood received in culture bottles   Culture   Final    NO GROWTH 3 DAYS Performed at [facility], [address] [place], [place] [address]    Report Status PENDING  Incomplete  Culture, blood (routine x 2)     Status: None (Preliminary result)   Collection Time: 08/18/20  1:47 PM   Specimen: BLOOD  Result Value Ref Range Status   Specimen Description BLOOD RIGHT ANTECUBITAL  Final   Special Requests   Final    BOTTLES DRAWN AEROBIC AND ANAEROBIC Blood Culture adequate volume   Culture   Final    NO GROWTH 3 DAYS Performed at [facility], [address] [place], [place] [address]    Report Status PENDING  Incomplete  Respiratory Panel by RT PCR (Flu A&B, Covid) - Nasopharyngeal Swab     Status: None   Collection Time: 08/18/20  1:47 PM   Specimen: Nasopharyngeal Swab  Result Value Ref Range Status   SARS Coronavirus 2 by RT PCR NEGATIVE NEGATIVE Final    Comment: (NOTE) SARS-CoV-2 target nucleic acids are NOT DETECTED.  The SARS-CoV-2 RNA is generally detectable in upper respiratoy specimens during  the acute phase of infection. The lowest concentration of SARS-CoV-2 viral copies this assay can detect is 131 copies/mL. A negative result does not preclude SARS-Cov-2 infection and should not be used as the sole basis for treatment or other patient management decisions. A negative result may occur with  improper specimen collection/handling, submission of specimen other than nasopharyngeal swab, presence of viral mutation(s) within the areas targeted by this assay, and inadequate number of viral copies (<131 copies/mL). A negative result must be combined with clinical observations, patient history, and epidemiological information. The expected result is Negative.  Fact Sheet for Patients:  PinkCheek.be  Fact Sheet for Healthcare Providers:   GravelBags.it  This test is no t yet approved or cleared by the [place] FDA and  has been authorized for detection and/or diagnosis of SARS-CoV-2 by FDA under an Emergency Use Authorization (EUA). This EUA will remain  in effect (meaning this test can be used) for the duration of the COVID-19 declaration under Section 564(b)(1) of the Act, 21 U.S.C. section 360bbb-3(b)(1), unless the authorization is terminated or revoked sooner.     Influenza A by PCR NEGATIVE NEGATIVE Final   Influenza B by PCR NEGATIVE NEGATIVE Final    Comment: (NOTE) The Xpert Xpress SARS-CoV-2/FLU/RSV assay is intended as an aid in  the diagnosis of influenza from Nasopharyngeal swab specimens and  should not be used as a sole basis for treatment. Nasal washings and  aspirates are unacceptable for Xpert Xpress SARS-CoV-2/FLU/RSV  testing.  Fact Sheet for Patients: PinkCheek.be  Fact Sheet for Healthcare Providers: GravelBags.it  This test is not yet approved or cleared by the [place] FDA and  has been authorized for detection and/or diagnosis of SARS-CoV-2 by  FDA under an Emergency Use Authorization (EUA). This EUA will remain  in effect (meaning this test can be used) for the duration of the  Covid-19 declaration under Section 564(b)(1) of the Act, 21  U.S.C. section 360bbb-3(b)(1), unless the authorization is  terminated or revoked. Performed at [facility], [address] [place], [place] [address]   Urine Culture     Status: Abnormal   Collection Time: 08/18/20  3:51 PM   Specimen: Urine, Random  Result Value Ref Range Status   Specimen Description   Final    URINE, RANDOM Performed at [facility], [address] [place], [place] [address]    Special Requests   Final    NONE Performed at [facility], [address] [place], [place] [address]    Culture (A)   Final    >=100,000 COLONIES/mL PSEUDOMONAS AERUGINOSA >=100,000 COLONIES/mL KLEBSIELLA PNEUMONIAE    Report Status 08/21/2020 FINAL  Final   Organism ID, Bacteria PSEUDOMONAS AERUGINOSA (A)  Final   Organism ID, Bacteria KLEBSIELLA PNEUMONIAE (A)  Final      Susceptibility   Klebsiella pneumoniae - MIC*    AMPICILLIN >=32 RESISTANT Resistant     CEFAZOLIN <=4 SENSITIVE Sensitive     CEFEPIME <=0.12 SENSITIVE Sensitive     CEFTRIAXONE <=0.25 SENSITIVE Sensitive     CIPROFLOXACIN <=0.25 SENSITIVE Sensitive     GENTAMICIN <=1 SENSITIVE Sensitive     IMIPENEM <=0.25 SENSITIVE Sensitive     NITROFURANTOIN 64 INTERMEDIATE Intermediate     TRIMETH/SULFA <=20 SENSITIVE Sensitive     AMPICILLIN/SULBACTAM 8 SENSITIVE Sensitive     PIP/TAZO <=4 SENSITIVE Sensitive     * >=100,000 COLONIES/mL KLEBSIELLA PNEUMONIAE   Pseudomonas aeruginosa - MIC*    CEFTAZIDIME 2 SENSITIVE  Sensitive     CIPROFLOXACIN <=0.25 SENSITIVE Sensitive     GENTAMICIN <=1 SENSITIVE Sensitive     IMIPENEM 1 SENSITIVE Sensitive     PIP/TAZO <=4 SENSITIVE Sensitive     CEFEPIME 2 SENSITIVE Sensitive     * >=100,000 COLONIES/mL PSEUDOMONAS AERUGINOSA     Time coordinating discharge: Over 30 minutes  SIGNED:   Nolberto Hanlon, MD  [facility] 08/21/2020, 2:51 PM Pager   If 7PM-7AM, please contact night-coverage [email or web address] Password [email or web address]

## 2020-08-22 LAB — BASIC METABOLIC PANEL
Anion gap: 7 (ref 5–15)
BUN: 21 mg/dL — ABNORMAL HIGH (ref 6–20)
CO2: 14 mmol/L — ABNORMAL LOW (ref 22–32)
Calcium: 8.5 mg/dL — ABNORMAL LOW (ref 8.9–10.3)
Chloride: 109 mmol/L (ref 98–111)
Creatinine, Ser: 1.98 mg/dL — ABNORMAL HIGH (ref 0.61–1.24)
GFR, Estimated: 42 mL/min — ABNORMAL LOW (ref >60)
Glucose, Bld: 187 mg/dL — ABNORMAL HIGH (ref 70–99)
Potassium: 4.4 mmol/L (ref 3.5–5.1)
Sodium: 130 mmol/L — ABNORMAL LOW (ref 135–145)

## 2020-08-22 LAB — GLUCOSE, CAPILLARY
Glucose-Capillary: 174 mg/dL — ABNORMAL HIGH (ref 70–99)
Glucose-Capillary: 237 mg/dL — ABNORMAL HIGH (ref 70–99)
Glucose-Capillary: 331 mg/dL — ABNORMAL HIGH (ref 70–99)
Glucose-Capillary: 322 mg/dL — ABNORMAL HIGH (ref 70–99)

## 2020-08-22 LAB — PHOSPHORUS: Phosphorus: 2.6 mg/dL (ref 2.5–4.6)

## 2020-08-22 MED ORDER — SODIUM CHLORIDE 0.9 % IV SOLN: INTRAVENOUS | Status: DC

## 2020-08-22 MED ORDER — INSULIN GLARGINE 100 UNIT/ML ~~LOC~~ SOLN
12.0000 [IU] | Freq: Every day | SUBCUTANEOUS | Status: DC
  Administered 2020-08-23: 12 [IU] via SUBCUTANEOUS
  Filled 2020-08-22: qty 0.12

## 2020-08-22 NOTE — Progress Notes (Signed)
Physical Therapy Treatment Patient Details Name: Gregory Moody MRN: [id number] DOB: 1975/11/15 Today's Date: 08/22/2020    History of Present Illness Gregory Moody is a 47yoM who comes to [facility] ED on 08/18/20 Moody progressed RT foot pain nopw unable to walk. Pt noted to have hypotension in ED (58/61mmHg), BG in 600s.  Imaging study of Rt foot suggestive of need for additional imaging as an outpatient per ortho. PMH: chronic hypotension on midodrine, IDDM, GERD, gastroparesis, diarrhea, anemia, tobacco abuse, covid19 (July 2020), diarrhea, Moody Diff, syncope.    PT Comments    Pt in bed upon entry, reports no change to his leg pain. Pt reports continued total leg pain from hip to foot, 10/10, reports some muscles cramping at times, as well as a little tingling, but denies any burning sensation. Pt does reports some relief with the pain meds he got on Wednesday. Modified independent mobility to EOB, then to standing. Pt able to progress AMB to 6ft with RW, but remains limited by pain. Pt continued to voice frustration over being DC and still being 'unable to walk due to pain'. He explains that his family is sick and unable to provide much help. He is concerned that he does not have transportation or money to follow-up with imaging and medical appointments at [place].     Follow Up Recommendations  Home health PT     Equipment Recommendations  None recommended by PT    Recommendations for Other Services       Precautions / Restrictions Precautions Precautions: Fall Restrictions Weight Bearing Restrictions: No    Mobility  Bed Mobility Overal bed mobility: Modified Independent                Transfers Overall transfer level: Modified independent Equipment used: None                Ambulation/Gait Ambulation/Gait assistance: Supervision Gait Distance (Feet): 50 Feet Assistive device: Rolling walker (2 wheeled)   Gait velocity: increased   General Gait  Details: uses RW, very wide based gait,   Stairs             Wheelchair Mobility    Modified Rankin (Stroke Patients Only)       Balance Overall balance assessment: History of Falls;Modified Independent;No apparent balance deficits (not formally assessed)                                          Cognition Arousal/Alertness: Awake/alert Behavior During Therapy: WFL for tasks assessed/performed Overall Cognitive Status: Within Functional Limits for tasks assessed                                        Exercises      General Comments        Pertinent Vitals/Pain Pain Assessment: 0-10 Pain Score: 10-Worst pain ever Pain Location: Right leg from hip to foot    Home Living                      Prior Function            PT Goals (current goals can now be found in the care plan section) Acute Rehab PT Goals Patient Stated Goal: resolve pain in RLE PT Goal Formulation: With patient Time For Goal  Achievement: 09/03/20 Potential to Achieve Goals: Poor Progress towards PT goals: Progressing toward goals    Frequency    Min 2X/week      PT Plan Current plan remains appropriate    Co-evaluation              AM-PAC PT "6 Clicks" Mobility   Outcome Measure  Help needed turning from your back to your side while in a flat bed without using bedrails?: None Help needed moving from lying on your back to sitting on the side of a flat bed without using bedrails?: None Help needed moving to and from a bed to a chair (including a wheelchair)?: None Help needed standing up from a chair using your arms (e.g., wheelchair or bedside chair)?: None Help needed to walk in hospital room?: A Little Help needed climbing 3-5 steps with a railing? : A Little 6 Click Score: 22    End of Session   Activity Tolerance: Patient limited by pain;Patient tolerated treatment well Patient left: in bed;with call bell/phone within  reach;with bed alarm set Nurse Communication: Mobility status PT Visit Diagnosis: Muscle weakness (generalized) (M62.81);Difficulty in walking, not elsewhere classified (R26.2)     Time: 0634-9494 PT Time Calculation (min) (ACUTE ONLY): 21 min  Charges:  $Therapeutic Exercise: 8-22 mins                     11:40 AM, 08/22/20 Gregory Moody, PT, DPT Physical Therapist - [facility]  [phone] ([place])    Gregory Moody 08/22/2020, 11:31 AM

## 2020-08-22 NOTE — Progress Notes (Signed)
Pt was taught how to use glucometer provided by case manager, step by step. All questions has been addressed. Pt seems to be not willing to learn and refused to demonstrate blood sugar checking.

## 2020-08-22 NOTE — Progress Notes (Addendum)
PROGRESS NOTE    Gregory Moody  UDJ:[id number] DOB: 1975/11/10 DOA: 08/18/2020 PCP: [place]    Brief Narrative:  Gregory Santiago-Gonzalezis a 44 y.o.malewith medical history significant fordiabetes mellitus insulin-dependent, GERD, gastroparesis, history of DKA, anemia of chronic disease, tobacco abuse, esophageal candidiasis, history of COVID-19 infection on 04/23/2019, history of C. difficile colitis, failure to thrive, presented to the emergency department for chief concerns of generalized weakness and right leg pain. Patient earlier in the day went to urology office where his suprapubic catheter was exchanged.    Consultants:     Procedures:  Venous ultrasound 11/8-negative Foot x-ray -Sclerosis at the base of the second metatarsal which could reflect stress fracture. Follow-up outpatient MRI or three-phase bone scan could be performed if clinical symptoms exist in this region. 2. 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 3. No displaced fractures.  Antimicrobials:   Ceftriaxone-d/cd    Subjective: Eating regular ice cream. No other complaints. Per nsg, had only one bm today. Not much diarrhea  Objective: Vitals:   08/22/20 0028 08/22/20 0550 08/22/20 0828 08/22/20 1138  BP: 92/75 (!) 136/100 110/81 (!) 118/93  Pulse: 95 78 79 80  Resp: 18 18 16 15   Temp: 98.1 F (36.7 C) (!) 97.5 F (36.4 C) 98.5 F (36.9 C) 99.2 F (37.3 C)  TempSrc: Oral  Oral Oral  SpO2: 100% 100% 100% 100%  Weight:        Intake/Output Summary (Last 24 hours) at 08/22/2020 1440 Last data filed at 08/22/2020 0900 Gross per 24 hour  Intake --  Output 1575 ml  Net -1575 ml   Filed Weights   08/19/20 1732  Weight: 46.8 kg    Examination:  General exam: Appears calm and comfortable  Respiratory system: Clear to auscultation. Respiratory effort normal. Cardiovascular system: S1 & S2 heard, RRR. No JVD, murmurs, rubs, gallops  or clicks.  Gastrointestinal system: Abdomen is nondistended, soft and nontender. Normal bowel sounds heard. Central nervous system: Alert and oriented. Grossly intact Extremities:no edema Skin: warm, dry Psychiatry: Mood & affect appropriate.     Data Reviewed: I have personally reviewed following labs and imaging studies  CBC: Recent Labs  Lab 08/18/20 1317 08/19/20 0014  WBC 6.3 7.5  NEUTROABS 4.5  --   HGB 8.4* 8.0*  HCT 26.7* 23.8*  MCV 98.9 94.1  PLT 379 588   Basic Metabolic Panel: Recent Labs  Lab 08/18/20 1317 08/18/20 1551 08/19/20 0014 08/20/20 0525 08/21/20 0522 08/22/20 0558  NA 125*  --  135 130* 131* 130*  K 4.8  --  3.9 3.8 4.0 4.4  CL 96*  --  108 110 111 109  CO2 18*  --  18* 15* 14* 14*  GLUCOSE 624*  --  298* 142* 178* 187*  BUN 36*  --  37* 27* 24* 21*  CREATININE 2.69*  --  2.09* 1.62* 1.77* 1.98*  CALCIUM 8.6*  --  8.1* 8.0* 8.2* 8.5*  MG  --   --   --  1.8 1.9  --   PHOS  --  2.5  --  1.8* 2.4* 2.6   GFR: Estimated Creatinine Clearance: 31.2 mL/min (A) (by C-G formula based on SCr of 1.98 mg/dL (H)). Liver Function Tests: Recent Labs  Lab 08/18/20 1317 08/19/20 0014  AST 12* 12*  ALT 14 12  ALKPHOS 74 80  BILITOT 0.5 0.4  PROT 6.9 6.4*  ALBUMIN 3.2* 3.1*   Recent Labs  Lab 08/18/20 1317  LIPASE 58*  No results for input(s): AMMONIA in the last 168 hours. Coagulation Profile: Recent Labs  Lab 08/19/20 0014  INR 1.0   Cardiac Enzymes: No results for input(s): CKTOTAL, CKMB, CKMBINDEX, TROPONINI in the last 168 hours. BNP (last 3 results) No results for input(s): PROBNP in the last 8760 hours. HbA1C: No results for input(s): HGBA1C in the last 72 hours. CBG: Recent Labs  Lab 08/21/20 1136 08/21/20 1547 08/21/20 2126 08/22/20 0736 08/22/20 1139  GLUCAP 217* 232* 250* 174* 237*   Lipid Profile: No results for input(s): CHOL, HDL, LDLCALC, TRIG, CHOLHDL, LDLDIRECT in the last 72 hours. Thyroid Function  Tests: No results for input(s): TSH, T4TOTAL, FREET4, T3FREE, THYROIDAB in the last 72 hours. Anemia Panel: No results for input(s): VITAMINB12, FOLATE, FERRITIN, TIBC, IRON, RETICCTPCT in the last 72 hours. Sepsis Labs: Recent Labs  Lab 08/18/20 1317 08/18/20 1551 08/19/20 1031  LATICACIDVEN 3.4* 2.4* 1.6    Recent Results (from the past 240 hour(s))  Culture, blood (routine x 2)     Status: None (Preliminary result)   Collection Time: 08/18/20  1:21 PM   Specimen: BLOOD  Result Value Ref Range Status   Specimen Description BLOOD BLOOD LEFT FOREARM  Final   Special Requests   Final    BOTTLES DRAWN AEROBIC AND ANAEROBIC Blood Culture results may not be optimal due to an inadequate volume of blood received in culture bottles   Culture   Final    NO GROWTH 4 DAYS Performed at [facility], [address] [place], [place] [address]    Report Status PENDING  Incomplete  Culture, blood (routine x 2)     Status: None (Preliminary result)   Collection Time: 08/18/20  1:47 PM   Specimen: BLOOD  Result Value Ref Range Status   Specimen Description BLOOD RIGHT ANTECUBITAL  Final   Special Requests   Final    BOTTLES DRAWN AEROBIC AND ANAEROBIC Blood Culture adequate volume   Culture   Final    NO GROWTH 4 DAYS Performed at [facility], [address], [place], [place] [address]    Report Status PENDING  Incomplete  Respiratory Panel by RT PCR (Flu A&B, Covid) - Nasopharyngeal Swab     Status: None   Collection Time: 08/18/20  1:47 PM   Specimen: Nasopharyngeal Swab  Result Value Ref Range Status   SARS Coronavirus 2 by RT PCR NEGATIVE NEGATIVE Final    Comment: (NOTE) SARS-CoV-2 target nucleic acids are NOT DETECTED.  The SARS-CoV-2 RNA is generally detectable in upper respiratoy specimens during the acute phase of infection. The lowest concentration of SARS-CoV-2 viral copies this assay can detect is 131 copies/mL. A negative result does not preclude  SARS-Cov-2 infection and should not be used as the sole basis for treatment or other patient management decisions. A negative result may occur with  improper specimen collection/handling, submission of specimen other than nasopharyngeal swab, presence of viral mutation(s) within the areas targeted by this assay, and inadequate number of viral copies (<131 copies/mL). A negative result must be combined with clinical observations, patient history, and epidemiological information. The expected result is Negative.  Fact Sheet for Patients:  PinkCheek.be  Fact Sheet for Healthcare Providers:  GravelBags.it  This test is no t yet approved or cleared by the [place] FDA and  has been authorized for detection and/or diagnosis of SARS-CoV-2 by FDA under an Emergency Use Authorization (EUA). This EUA will remain  in effect (meaning this test can be used)  for the duration of the COVID-19 declaration under Section 564(b)(1) of the Act, 21 U.S.C. section 360bbb-3(b)(1), unless the authorization is terminated or revoked sooner.     Influenza A by PCR NEGATIVE NEGATIVE Final   Influenza B by PCR NEGATIVE NEGATIVE Final    Comment: (NOTE) The Xpert Xpress SARS-CoV-2/FLU/RSV assay is intended as an aid in  the diagnosis of influenza from Nasopharyngeal swab specimens and  should not be used as a sole basis for treatment. Nasal washings and  aspirates are unacceptable for Xpert Xpress SARS-CoV-2/FLU/RSV  testing.  Fact Sheet for Patients: PinkCheek.be  Fact Sheet for Healthcare Providers: GravelBags.it  This test is not yet approved or cleared by the [place] FDA and  has been authorized for detection and/or diagnosis of SARS-CoV-2 by  FDA under an Emergency Use Authorization (EUA). This EUA will remain  in effect (meaning this test can be used) for the duration of the   Covid-19 declaration under Section 564(b)(1) of the Act, 21  U.S.C. section 360bbb-3(b)(1), unless the authorization is  terminated or revoked. Performed at [facility], [address] [place], [place] [address]   Urine Culture     Status: Abnormal   Collection Time: 08/18/20  3:51 PM   Specimen: Urine, Random  Result Value Ref Range Status   Specimen Description   Final    URINE, RANDOM Performed at [facility], [address] [place], [place] [address]    Special Requests   Final    NONE Performed at [facility], [place],  [address]    Culture (A)  Final    >=100,000 COLONIES/mL PSEUDOMONAS AERUGINOSA >=100,000 COLONIES/mL KLEBSIELLA PNEUMONIAE    Report Status 08/21/2020 FINAL  Final   Organism ID, Bacteria PSEUDOMONAS AERUGINOSA (A)  Final   Organism ID, Bacteria KLEBSIELLA PNEUMONIAE (A)  Final      Susceptibility   Klebsiella pneumoniae - MIC*    AMPICILLIN >=32 RESISTANT Resistant     CEFAZOLIN <=4 SENSITIVE Sensitive     CEFEPIME <=0.12 SENSITIVE Sensitive     CEFTRIAXONE <=0.25 SENSITIVE Sensitive     CIPROFLOXACIN <=0.25 SENSITIVE Sensitive     GENTAMICIN <=1 SENSITIVE Sensitive     IMIPENEM <=0.25 SENSITIVE Sensitive     NITROFURANTOIN 64 INTERMEDIATE Intermediate     TRIMETH/SULFA <=20 SENSITIVE Sensitive     AMPICILLIN/SULBACTAM 8 SENSITIVE Sensitive     PIP/TAZO <=4 SENSITIVE Sensitive     * >=100,000 COLONIES/mL KLEBSIELLA PNEUMONIAE   Pseudomonas aeruginosa - MIC*    CEFTAZIDIME 2 SENSITIVE Sensitive     CIPROFLOXACIN <=0.25 SENSITIVE Sensitive     GENTAMICIN <=1 SENSITIVE Sensitive     IMIPENEM 1 SENSITIVE Sensitive     PIP/TAZO <=4 SENSITIVE Sensitive     CEFEPIME 2 SENSITIVE Sensitive     * >=100,000 COLONIES/mL PSEUDOMONAS AERUGINOSA         Radiology Studies: No results found.      Scheduled Meds: . aspirin EC  81 mg Oral Daily  . benztropine  0.5 mg Oral BID  . colesevelam   1,875 mg Oral BID WC  . DULoxetine  30 mg Oral Daily  . feeding supplement (GLUCERNA SHAKE)  237 mL Oral TID BM  . ferrous sulfate  325 mg Oral TID WC  . haloperidol  0.5 mg Oral BID  . heparin  5,000 Units Subcutaneous Q8H  . insulin aspart  0-6 Units Subcutaneous TID AC & HS  . insulin glargine  8 Units  Subcutaneous Daily  . megestrol  200 mg Oral Daily  . midodrine  2.5 mg Oral TID WC  . multivitamin with minerals  1 tablet Oral Daily  . pantoprazole  80 mg Oral Q1200  . sodium bicarbonate  1,300 mg Oral BID   Continuous Infusions: . sodium chloride 75 mL/hr at 08/22/20 1155    Assessment & Plan:   Principal Problem:   Weakness Active Problems:   Malnutrition ([place])   Hyperglycemia   Diabetes mellitus with hyperglycemia (HCC)   Diabetic gastroparesis (HCC)   AKI (acute kidney injury) ([place])   Acute renal failure (ARF) (HCC)   Hypotension   Anemia of chronic disease   Depression   Protein-calorie malnutrition, severe   Acute urinary retention   FTT (failure to thrive) in adult   Pain in right lower leg   Acidosis   Acute lower UTI   Generalized weakness/UTI/chronic suprapubic catheter(exchanged on 08/18/2020) acute on chronic renal failureIIIb (baseline creat 1.5-.18) Acidosis -BP better, will continue IV fluids at times since it drops.   Lactic acid normalized  11/12- ucx with Unasyn Klebsiella which is likely colonized as he has chronic suprapubic catheter which was exchanged recently  Antibiotics were therefore discontinued because he was afebrile and no leukocytosis     Right foot pain -denies any trauma. Uses a cane to walk. -X-ray right foot showsSclerosis at the base of the second metatarsal which could reflect stress fracture. Follow-up outpatient MRI or three-phase bone scan could be performed if clinical symptoms exist in this region. 2. 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 3. No displaced fractures. Ortho consulted,  rec. Post op shoe, f/u as outpt. Reached out to PT and ortho about the shoe to obtain before d/c PT rec. HH   A/CKD stage IIIb- creatinine increasing. Today 1.98. likely prerenal Will resume ivf  Ck am labs   Hypotension chronic On midodrine Improved   Hyponatremia secondary to dehydration and elevated sugars Sodium down again at 130 Will resume ivf for hydration Monitor labs  Hyperglycemia in the setting of insulin-dependent diabetes with peripheral vascular disease/CKD stage IIIb BG at times elevated, as pt not really compliant with diet here.  Discussed this with the pt and also with nsg staff. Increase lantus to 12 units.   Anemia of chronic disease Continue with iron  chronic depression continue duloxetine, Haldol, Cogentin  severe protein calorie malnutrition-chronic follow dietary recommendation  history of chronic urinary retention status post suprapubic catheter placement--chronic -follows with urology as outpatient -catheter exchanged in the office on 18 August 2020.  Chronic diarrhea- on imodium prn   DVT prophylaxis: Heparin Code Status: Full Family Communication: none At bedside  Status is: Inpatient  Remains inpatient appropriate because:Inpatient level of care appropriate due to severity of illness   Dispo: The patient is from: Home              Anticipated d/c is to: Home              Anticipated d/c date is: 1 day              Patient currently is not medically stable to d/c.needs ivf, monitor labs            LOS: 3 days   Time spent: 45 min with >50% on coc    Nolberto Hanlon, MD [facility] Pager [phone]  If 7PM-7AM, please contact night-coverage [email or web address] Password [email or web address] 08/22/2020, 2:40 PM

## 2020-08-22 NOTE — Progress Notes (Signed)
[place] for Electrolyte Monitoring and Replacement   Recent Labs: Potassium (mmol/L)  Date Value  08/22/2020 4.4   Magnesium (mg/dL)  Date Value  08/21/2020 1.9   Calcium (mg/dL)  Date Value  08/22/2020 8.5 (L)   Calcium, Total (PTH) (mg/dL)  Date Value  04/17/2017 8.6 (L)   Albumin (g/dL)  Date Value  08/19/2020 3.1 (L)   Phosphorus (mg/dL)  Date Value  08/22/2020 2.6   Sodium (mmol/L)  Date Value  08/22/2020 130 (L)    Assessment: 44 yo male admitted with weakness, acute renal failure and UTI. Patient has severe chronic protein calorie malnutrition and is high risk for refeeding syndrome. Pharmacy consulted for electrolyte monitoring and replacement.   11/12: Scr slightly worse (1.62 >> 1.77 >> 1.98). NAGMA on oral sodium bicarb. Patient with chronic diarrhea. Sodium stable at 130. UOP of 1.6L yesterday.   Goal of Therapy:  Electrolytes WNL  Plan:   11/10 Na 130, K 4.4, Phos 2.6 --Phosphorous at lower limit of normal but improved. Enteral phosphorous likely exacerbating diarrhea --No electrolyte replacement indicated at this time --Potential discharge today. Will discontinue consult at this time and defer further ordering of labs and replacement of electrolytes to provider  Benita Gutter 08/22/2020 9:27 AM

## 2020-08-23 LAB — CULTURE, BLOOD (ROUTINE X 2)
Culture: NO GROWTH
Culture: NO GROWTH
Special Requests: ADEQUATE

## 2020-08-23 LAB — BASIC METABOLIC PANEL
Anion gap: 5 (ref 5–15)
BUN: 19 mg/dL (ref 6–20)
CO2: 17 mmol/L — ABNORMAL LOW (ref 22–32)
Calcium: 8.5 mg/dL — ABNORMAL LOW (ref 8.9–10.3)
Chloride: 106 mmol/L (ref 98–111)
Creatinine, Ser: 1.79 mg/dL — ABNORMAL HIGH (ref 0.61–1.24)
GFR, Estimated: 47 mL/min — ABNORMAL LOW (ref >60)
Glucose, Bld: 323 mg/dL — ABNORMAL HIGH (ref 70–99)
Potassium: 4.5 mmol/L (ref 3.5–5.1)
Sodium: 128 mmol/L — ABNORMAL LOW (ref 135–145)

## 2020-08-23 LAB — GLUCOSE, CAPILLARY
Glucose-Capillary: 285 mg/dL — ABNORMAL HIGH (ref 70–99)
Glucose-Capillary: 372 mg/dL — ABNORMAL HIGH (ref 70–99)
Glucose-Capillary: 284 mg/dL — ABNORMAL HIGH (ref 70–99)

## 2020-08-23 MED ORDER — INSULIN GLARGINE 100 UNIT/ML ~~LOC~~ SOLN
8.0000 [IU] | Freq: Every day | SUBCUTANEOUS | 0 refills | Status: DC
Start: 2020-08-23 — End: 2020-08-23

## 2020-08-23 MED ORDER — INSULIN GLARGINE 100 UNIT/ML ~~LOC~~ SOLN: 12.0000 [IU] | Freq: Every day | SUBCUTANEOUS | 0 refills | Status: AC

## 2020-08-23 NOTE — TOC Progression Note (Addendum)
Transition of Care [facility]) - Progression Note    Patient Details  Name: Gregory Moody MRN: [id number] Date of Birth: 03/11/1976  [facility]) CM/SW Contact  Izola Price, RN Phone Number: 08/23/2020, 9:14 AM  Clinical Narrative:    11/13 updated:  To be discharged today at 1300. Cab voucher updated. [facility] info given in [place]. Transit information given in Spanish. Good Rx card given. RN in room with CM and patient is able to understand and verbalized this with some questions that were answered. Eating lunch then discharge per provider. Simmie Davies RN CM   11/13 Continuing to try to find [facility] agency to accept patient.  So far:  [facility]-No [facility]-No [facility]-No (Stated no contracts yet for [employer])  Waiting on [facility] to return call.  Notes indicate patient was not open to learning how to take and test blood glucose with CM provided meter.  Notes indicate Cab Voucher was given for discharge transport home.  Simmie Davies RN CM [phone] am.     Expected Discharge Plan: [place]  Discharge to Home.   Expected Discharge Plan and Services Expected Discharge Plan: [place] Choice: [place] arrangements for the past 2 months: Mobile Home Expected Discharge Date: 08/21/20                                     Social Determinants of Health (SDOH) Interventions    Readmission Risk Interventions Readmission Risk Prevention Plan 08/21/2020 07/28/2020 05/09/2020  Transportation Screening Complete Complete Complete  PCP or Specialist Appt within 3-5 Days - - -  Social Work Consult for [place] - - -  Medication Review Press photographer) Complete Complete Complete  PCP or Specialist appointment within 3-5 days of discharge Complete Complete -  [place] or Home Care Consult Complete Complete -  SW Recovery  Care/Counseling Consult Complete Complete -  Palliative Care Screening Not Applicable Not Applicable -  [place] Not Applicable Not Applicable Not Applicable  Some recent data might be hidden

## 2020-08-23 NOTE — TOC Transition Note (Signed)
Transition of Care [facility]) - CM/SW Discharge Note   Patient Details  Name: Gregory Moody MRN: [id number] Date of Birth: [birth year]  Transition of Care [facility]) CM/SW Contact:  Izola Price, RN Phone Number: 08/23/2020, 12:22 PM   Clinical Narrative:    11/13 Pt. To be discharge HOME/SELF care per provider. [place], [facility], [employer] [facility], and Transit info given to patient--in Spanish where available. RN assigned to patient in room with CM and patient is able to speak some English well enough to verbalize understanding and ask questions. Cab Voucher updated for today. No HH, No DME. Discharge scheduled at 1300. CM will sign off for now. Simmie Davies RN CM    Final next level of care: Home/Self Care Barriers to Discharge: Barriers Resolved   Patient Goals and CMS Choice Patient states their goals for this hospitalization and ongoing recovery are:: "I want to feel better"      Discharge Placement HOME/Self Care                       Discharge Plan and Services  NA     Post Acute Care Choice: Home Health          DME Arranged: N/A DME Agency: NA         HH Agency: NA (None accepted.)        Social Determinants of Health (SDOH) Interventions     Readmission Risk Interventions Readmission Risk Prevention Plan 08/21/2020 07/28/2020 05/09/2020  Transportation Screening Complete Complete Complete  PCP or Specialist Appt within 3-5 Days - - -  Social Work Consult for [place] - - -  Medication Review Press photographer) Complete Complete Complete  PCP or Specialist appointment within 3-5 days of discharge Complete Complete -  [place] or Home Care Consult Complete Complete -  SW Recovery Care/Counseling Consult Complete Complete -  Palliative Care Screening Not Applicable Not Applicable -  [place] Not Applicable Not Applicable Not Applicable  Some recent data might be hidden

## 2020-09-11 ENCOUNTER — Other Ambulatory Visit: Payer: Self-pay

## 2020-09-11 ENCOUNTER — Emergency Department: Payer: Self-pay

## 2020-09-11 ENCOUNTER — Emergency Department
Admission: EM | Admit: 2020-09-11 | Discharge: 2020-09-12 | Disposition: A | Discharge status: Home / Self Care | Payer: Self-pay | Attending: Emergency Medicine | Admitting: Emergency Medicine

## 2020-09-11 DIAGNOSIS — Z8616 Personal history of COVID-19: Secondary | ICD-10-CM | POA: Insufficient documentation

## 2020-09-11 DIAGNOSIS — Z7982 Long term (current) use of aspirin: Secondary | ICD-10-CM | POA: Insufficient documentation

## 2020-09-11 DIAGNOSIS — S22080A Wedge compression fracture of T11-T12 vertebra, initial encounter for closed fracture: Secondary | ICD-10-CM

## 2020-09-11 DIAGNOSIS — Z794 Long term (current) use of insulin: Secondary | ICD-10-CM | POA: Insufficient documentation

## 2020-09-11 DIAGNOSIS — N184 Chronic kidney disease, stage 4 (severe): Secondary | ICD-10-CM | POA: Insufficient documentation

## 2020-09-11 DIAGNOSIS — R531 Weakness: Secondary | ICD-10-CM | POA: Insufficient documentation

## 2020-09-11 DIAGNOSIS — M549 Dorsalgia, unspecified: Secondary | ICD-10-CM | POA: Insufficient documentation

## 2020-09-11 DIAGNOSIS — E860 Dehydration: Secondary | ICD-10-CM | POA: Insufficient documentation

## 2020-09-11 DIAGNOSIS — R0789 Other chest pain: Secondary | ICD-10-CM | POA: Insufficient documentation

## 2020-09-11 DIAGNOSIS — Z87891 Personal history of nicotine dependence: Secondary | ICD-10-CM | POA: Insufficient documentation

## 2020-09-11 DIAGNOSIS — E1165 Type 2 diabetes mellitus with hyperglycemia: Secondary | ICD-10-CM | POA: Insufficient documentation

## 2020-09-11 DIAGNOSIS — Z20822 Contact with and (suspected) exposure to covid-19: Secondary | ICD-10-CM | POA: Insufficient documentation

## 2020-09-11 DIAGNOSIS — E1110 Type 2 diabetes mellitus with ketoacidosis without coma: Secondary | ICD-10-CM | POA: Insufficient documentation

## 2020-09-11 DIAGNOSIS — E1122 Type 2 diabetes mellitus with diabetic chronic kidney disease: Secondary | ICD-10-CM | POA: Insufficient documentation

## 2020-09-11 LAB — URINALYSIS, COMPLETE (UACMP) WITH MICROSCOPIC
Bilirubin Urine: NEGATIVE
Glucose, UA: NEGATIVE mg/dL
Ketones, ur: NEGATIVE mg/dL
Nitrite: NEGATIVE
Protein, ur: 100 mg/dL — AB
Specific Gravity, Urine: 1.011 (ref 1.005–1.030)
Squamous Epithelial / HPF: NONE SEEN (ref 0–5)
WBC, UA: >50 WBC/hpf — ABNORMAL HIGH (ref 0–5)
pH: 5 (ref 5.0–8.0)

## 2020-09-11 LAB — BASIC METABOLIC PANEL
Anion gap: 11 (ref 5–15)
Anion gap: 7 (ref 5–15)
BUN: 29 mg/dL — ABNORMAL HIGH (ref 6–20)
BUN: 29 mg/dL — ABNORMAL HIGH (ref 6–20)
CO2: 19 mmol/L — ABNORMAL LOW (ref 22–32)
CO2: 19 mmol/L — ABNORMAL LOW (ref 22–32)
Calcium: 8.7 mg/dL — ABNORMAL LOW (ref 8.9–10.3)
Calcium: 7.7 mg/dL — ABNORMAL LOW (ref 8.9–10.3)
Chloride: 101 mmol/L (ref 98–111)
Chloride: 105 mmol/L (ref 98–111)
Creatinine, Ser: 2.42 mg/dL — ABNORMAL HIGH (ref 0.61–1.24)
Creatinine, Ser: 2.1 mg/dL — ABNORMAL HIGH (ref 0.61–1.24)
GFR, Estimated: 33 mL/min — ABNORMAL LOW (ref >60)
GFR, Estimated: 39 mL/min — ABNORMAL LOW (ref >60)
Glucose, Bld: 331 mg/dL — ABNORMAL HIGH (ref 70–99)
Glucose, Bld: 391 mg/dL — ABNORMAL HIGH (ref 70–99)
Potassium: 3.9 mmol/L (ref 3.5–5.1)
Potassium: 3.8 mmol/L (ref 3.5–5.1)
Sodium: 131 mmol/L — ABNORMAL LOW (ref 135–145)
Sodium: 131 mmol/L — ABNORMAL LOW (ref 135–145)

## 2020-09-11 LAB — CBC
HCT: 23 % — ABNORMAL LOW (ref 39.0–52.0)
Hemoglobin: 7.8 g/dL — ABNORMAL LOW (ref 13.0–17.0)
MCH: 32.1 pg (ref 26.0–34.0)
MCHC: 33.9 g/dL (ref 30.0–36.0)
MCV: 94.7 fL (ref 80.0–100.0)
Platelets: 357 10*3/uL (ref 150–400)
RBC: 2.43 MIL/uL — ABNORMAL LOW (ref 4.22–5.81)
RDW: 13.6 % (ref 11.5–15.5)
WBC: 5.6 10*3/uL (ref 4.0–10.5)
nRBC: 0 % (ref 0.0–0.2)

## 2020-09-11 LAB — TROPONIN I (HIGH SENSITIVITY)
Troponin I (High Sensitivity): 5 ng/L (ref <18)
Troponin I (High Sensitivity): 5 ng/L (ref <18)

## 2020-09-11 LAB — RESP PANEL BY RT-PCR (FLU A&B, COVID) ARPGX2
Influenza A by PCR: NEGATIVE
Influenza B by PCR: NEGATIVE
SARS Coronavirus 2 by RT PCR: NEGATIVE

## 2020-09-11 LAB — CBG MONITORING, ED: Glucose-Capillary: 281 mg/dL — ABNORMAL HIGH (ref 70–99)

## 2020-09-11 LAB — BRAIN NATRIURETIC PEPTIDE: B Natriuretic Peptide: 31.6 pg/mL (ref 0.0–100.0)

## 2020-09-11 IMAGING — CR DG THORACIC SPINE 2V
5 series · 5 of 5 positions shown · non-contrast
Comparison: [DATE]

CLINICAL DATA: Back pain, fell

EXAM:
LUMBAR SPINE - 2-3 VIEW; THORACIC SPINE 2 VIEWS

[t-spine ap (1 of 2)]
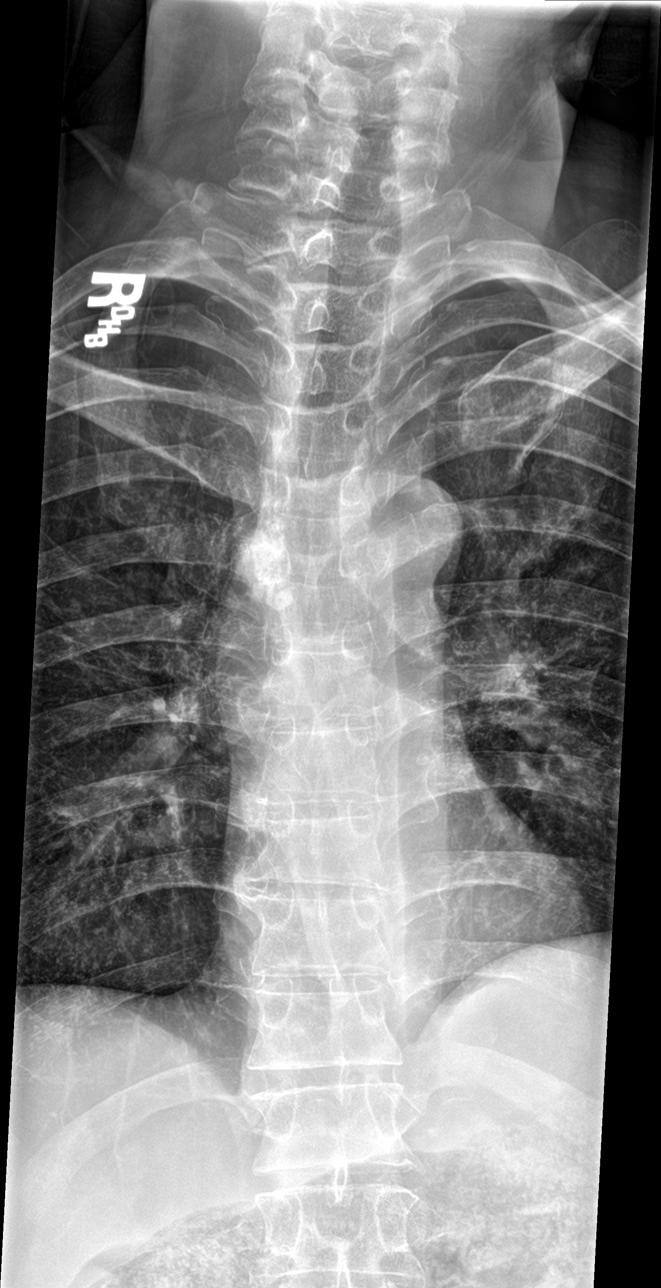

[t-spine lat (1 of 2)]
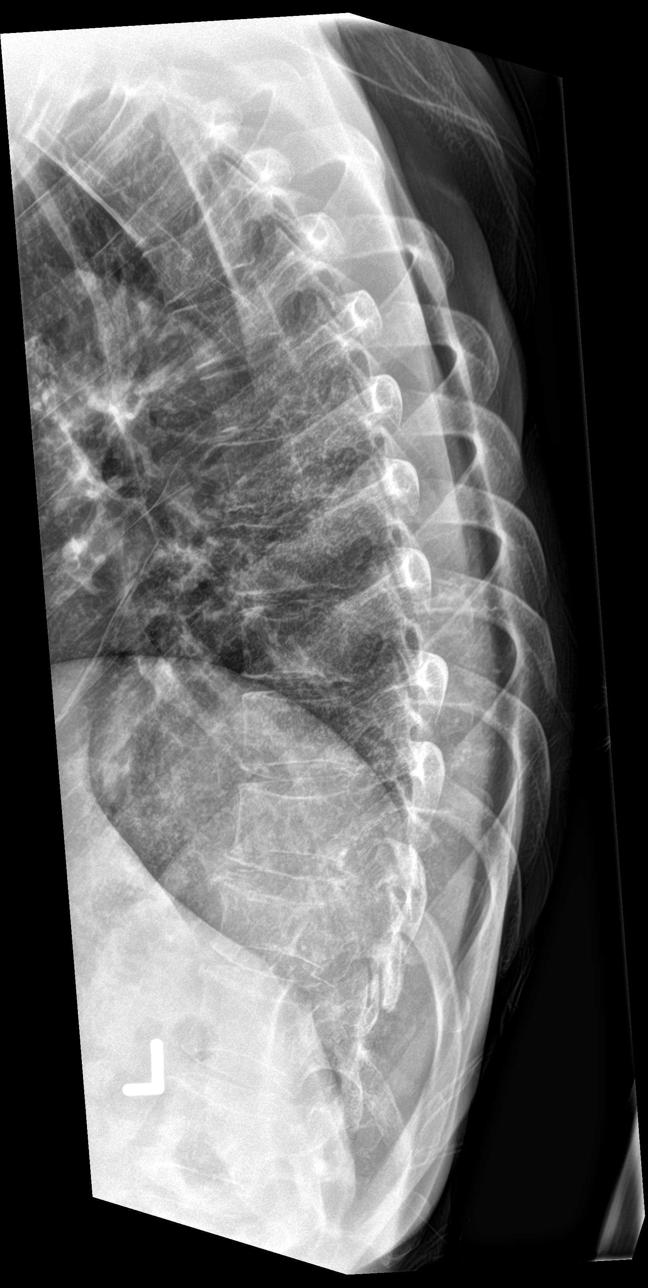

[t-spine swimmers]
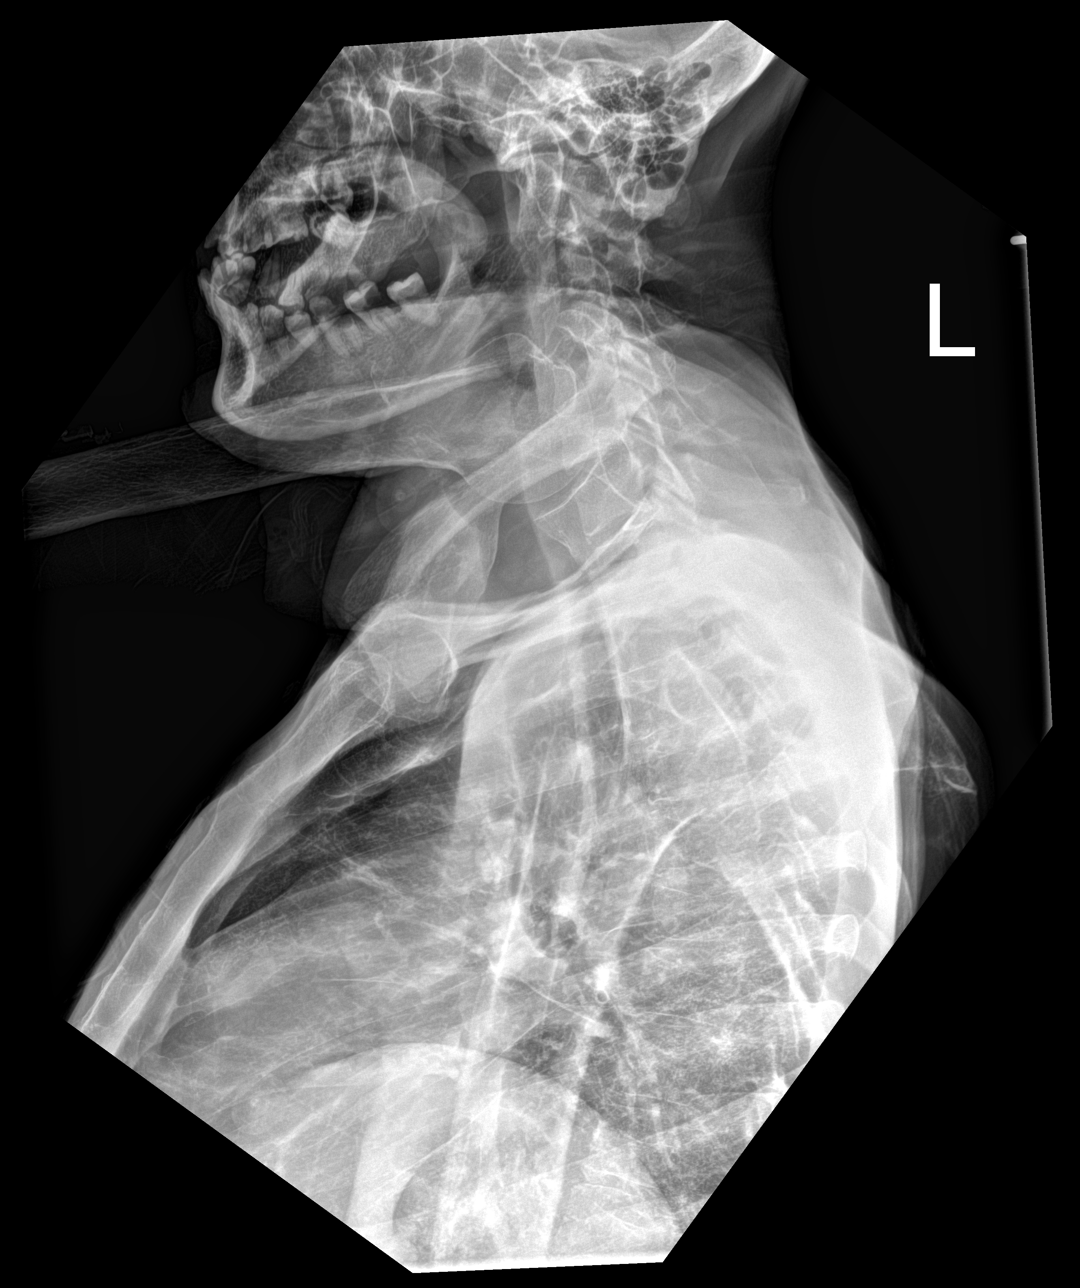

[t-spine lat (2 of 2)]
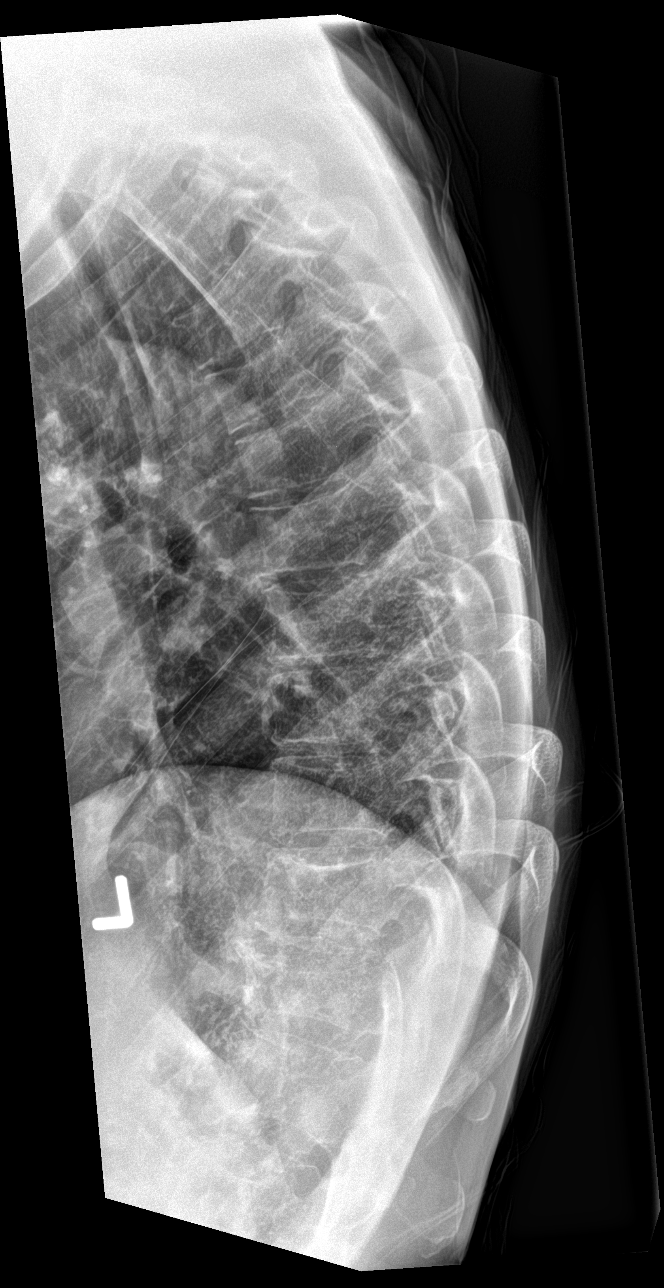

[t-spine ap (2 of 2)]
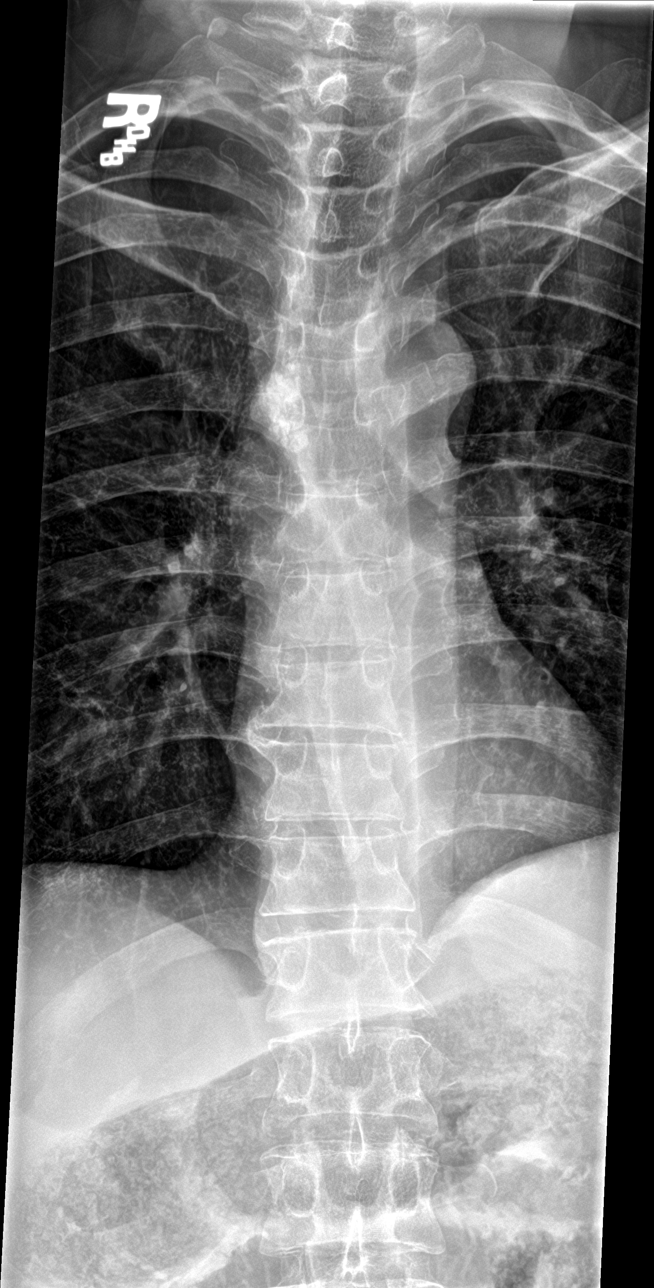

[5 of 5 positions shown; findings below may reference images not displayed]

FINDINGS: Thoracic spine: Frontal and lateral views of the thoracic spine are
obtained. There is mild right convex scoliosis. Mild anterior wedge
compression deformity is identified within the T11 vertebral body,
less than 10% loss of height. This is new since CT [DATE]. No
other acute bony abnormalities. Paraspinal soft tissues are
unremarkable.

Lumbar spine: Frontal and lateral views demonstrate 5
non-rib-bearing lumbar type vertebral bodies in anatomic alignment.
No acute fractures. Disc spaces are well preserved. Mild facet
hypertrophic changes at the lumbosacral junction. Sacroiliac joints
are normal.
IMPRESSION: 1. Mild anterior wedging of the T11 vertebral body, new since recent
CT, consistent with acute to subacute compression fracture. Less
than 10% loss of height.
2. Otherwise unremarkable thoracolumbar spine.

## 2020-09-11 IMAGING — DX DG CHEST 1V PORT
2 series · 2 of 2 positions shown · non-contrast
Comparison: Radiograph [DATE], CT [DATE]

CLINICAL DATA: Chest pain

EXAM:
PORTABLE CHEST 1 VIEW

[chest ap (1 of 2)]
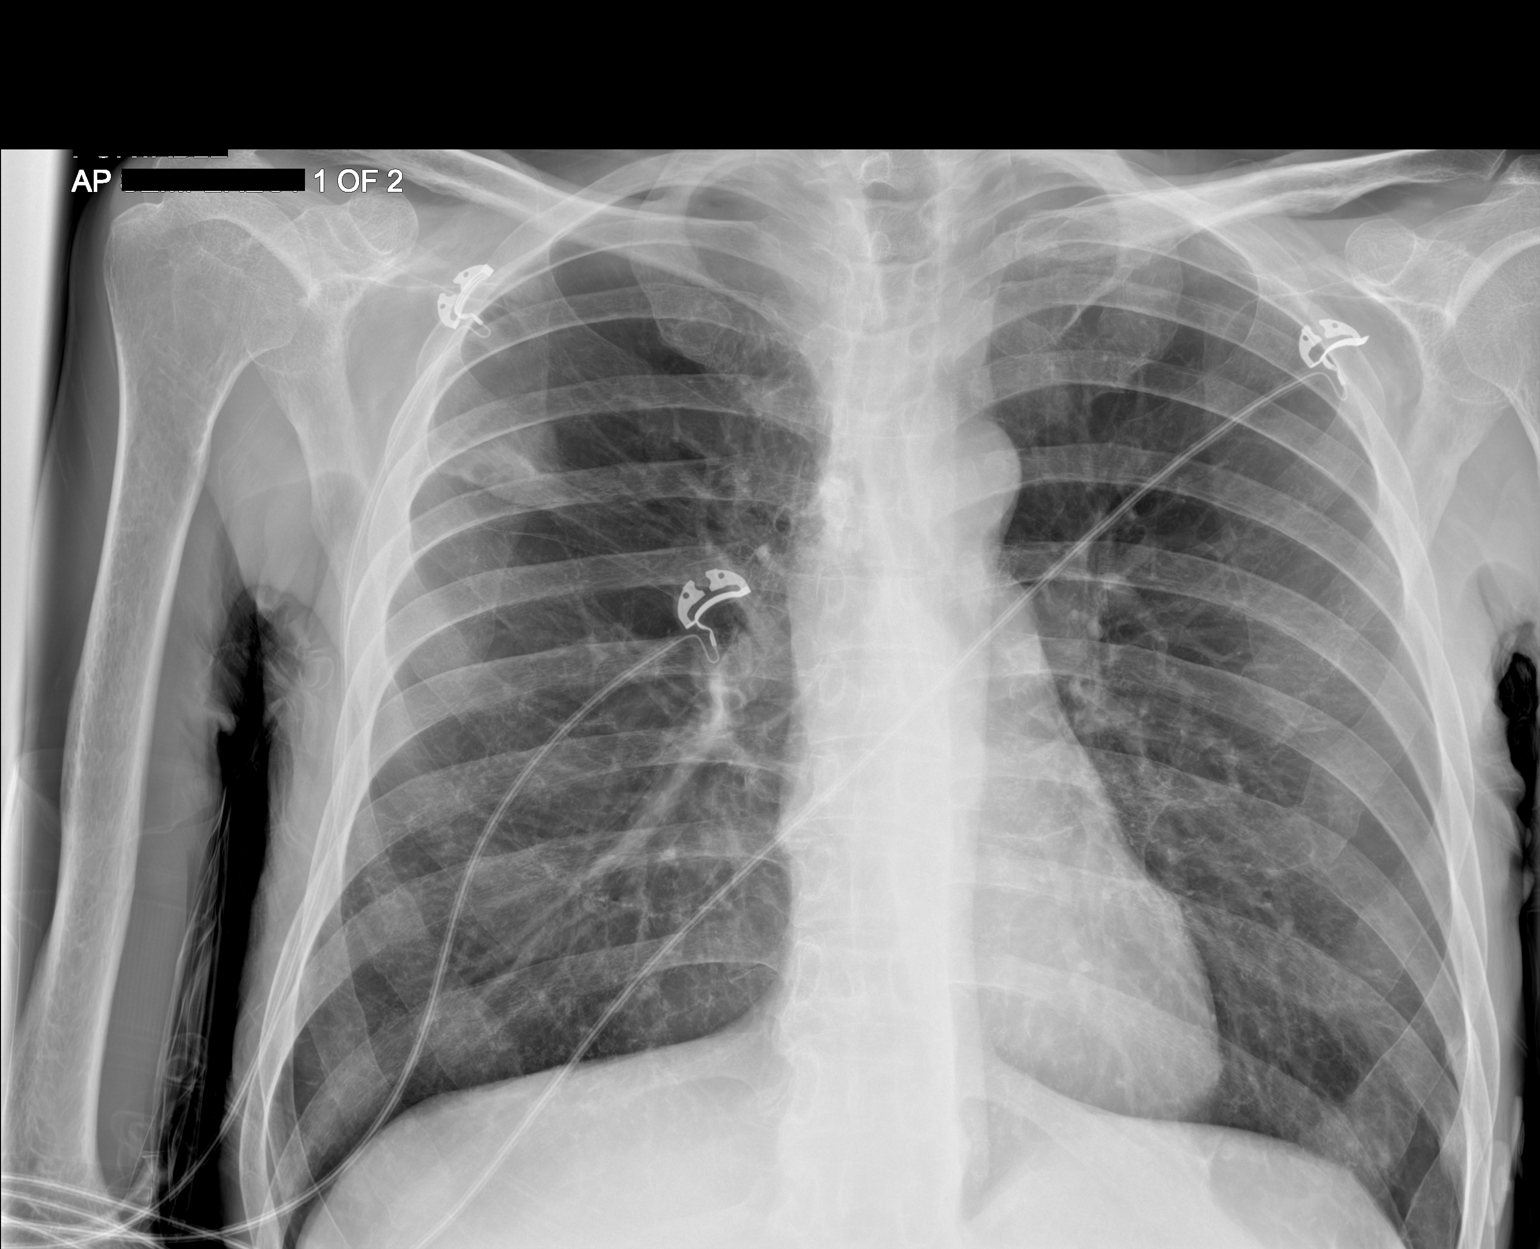

[chest ap (2 of 2)]
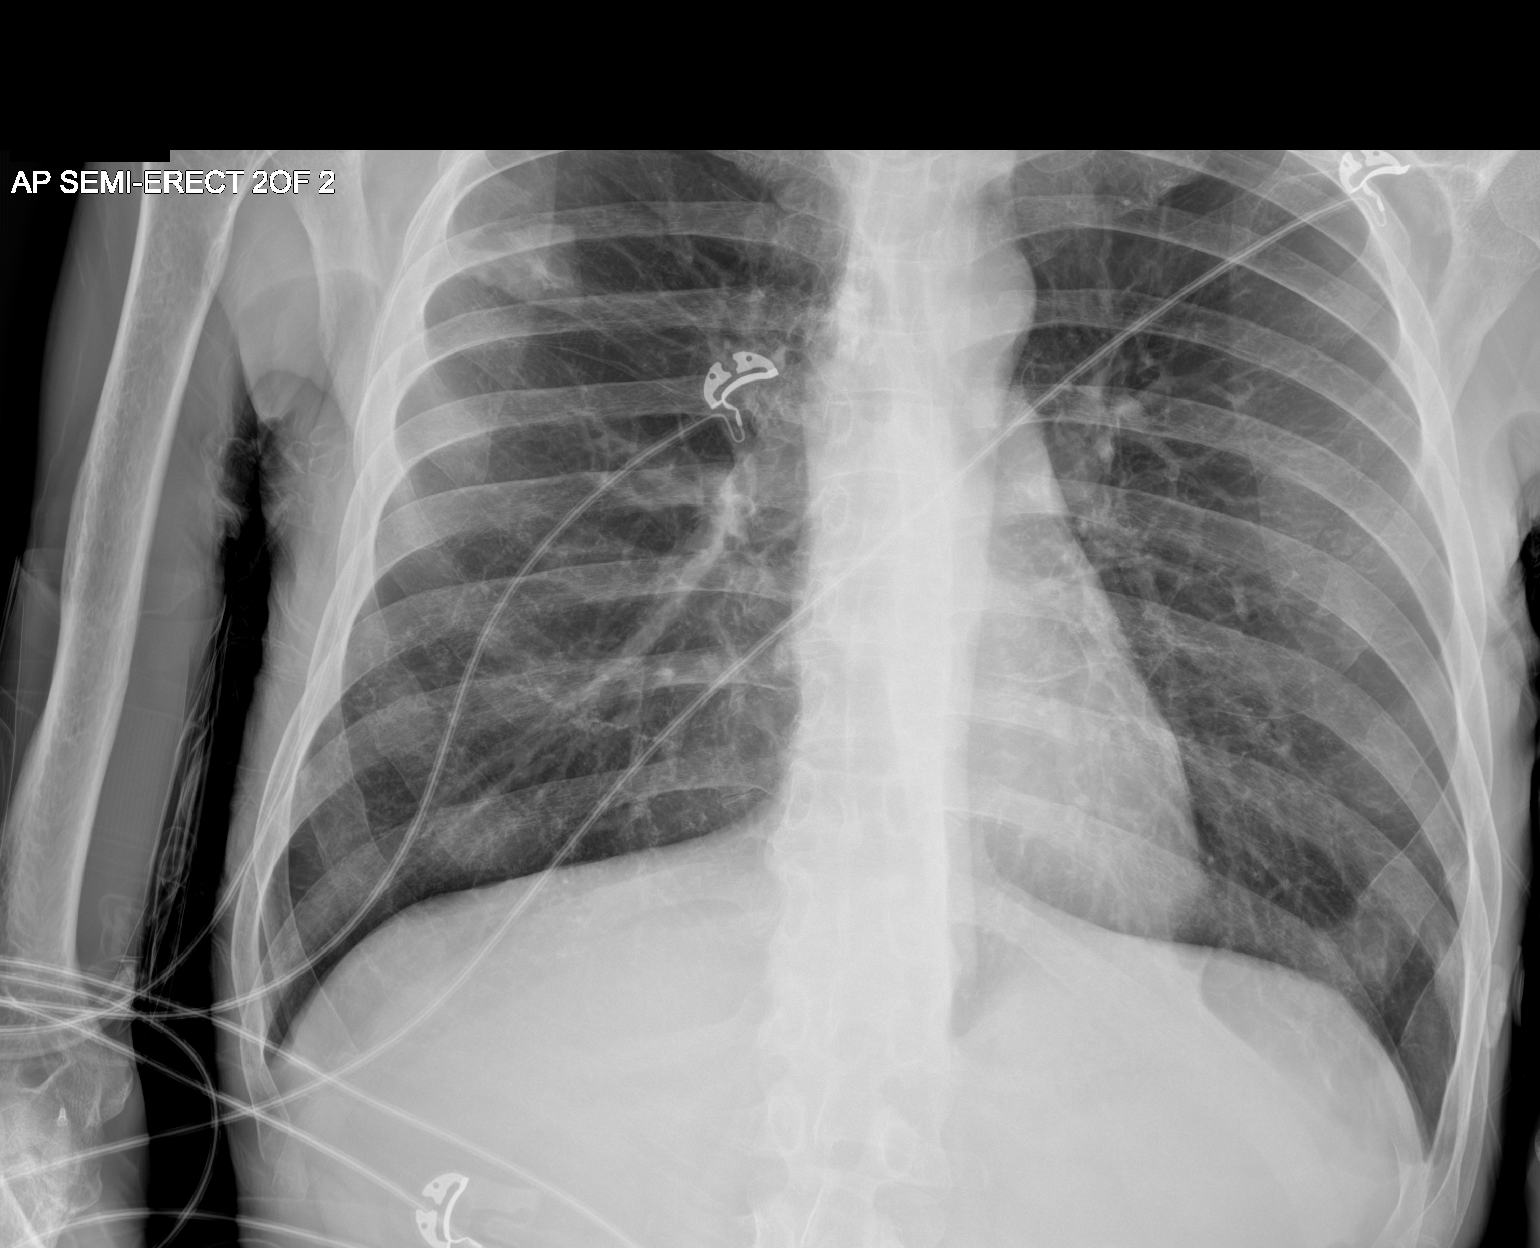

[2 of 2 positions shown; findings below may reference images not displayed]

FINDINGS: Redemonstrated areas of architectural distortion and nodular opacity
along the right minor fissure which is similar to comparison exam
and likely reflecting some post infectious or inflammatory scarring.
Additional chronic scarring/reticular opacities are seen in the
right lower lobe and left lung base as well. No new consolidative
opacity is seen. No pneumothorax or visible effusion. The
cardiomediastinal contours are unremarkable. No acute osseous or
soft tissue abnormality. Telemetry leads overlie the chest.
IMPRESSION: 1. Chronic areas of architectural distortion and nodular opacity
along the right minor fissure which is similar to comparison exam
and likely reflecting some post infectious or inflammatory scarring.
Additional chronic scarring/reticular opacities in the lower lungs.
2. No new acute cardiopulmonary abnormality.

## 2020-09-11 IMAGING — CR DG LUMBAR SPINE 2-3V
3 series · 3 of 3 positions shown · non-contrast
Comparison: [DATE]

CLINICAL DATA: Back pain, fell

EXAM:
LUMBAR SPINE - 2-3 VIEW; THORACIC SPINE 2 VIEWS

[l-spine ap]
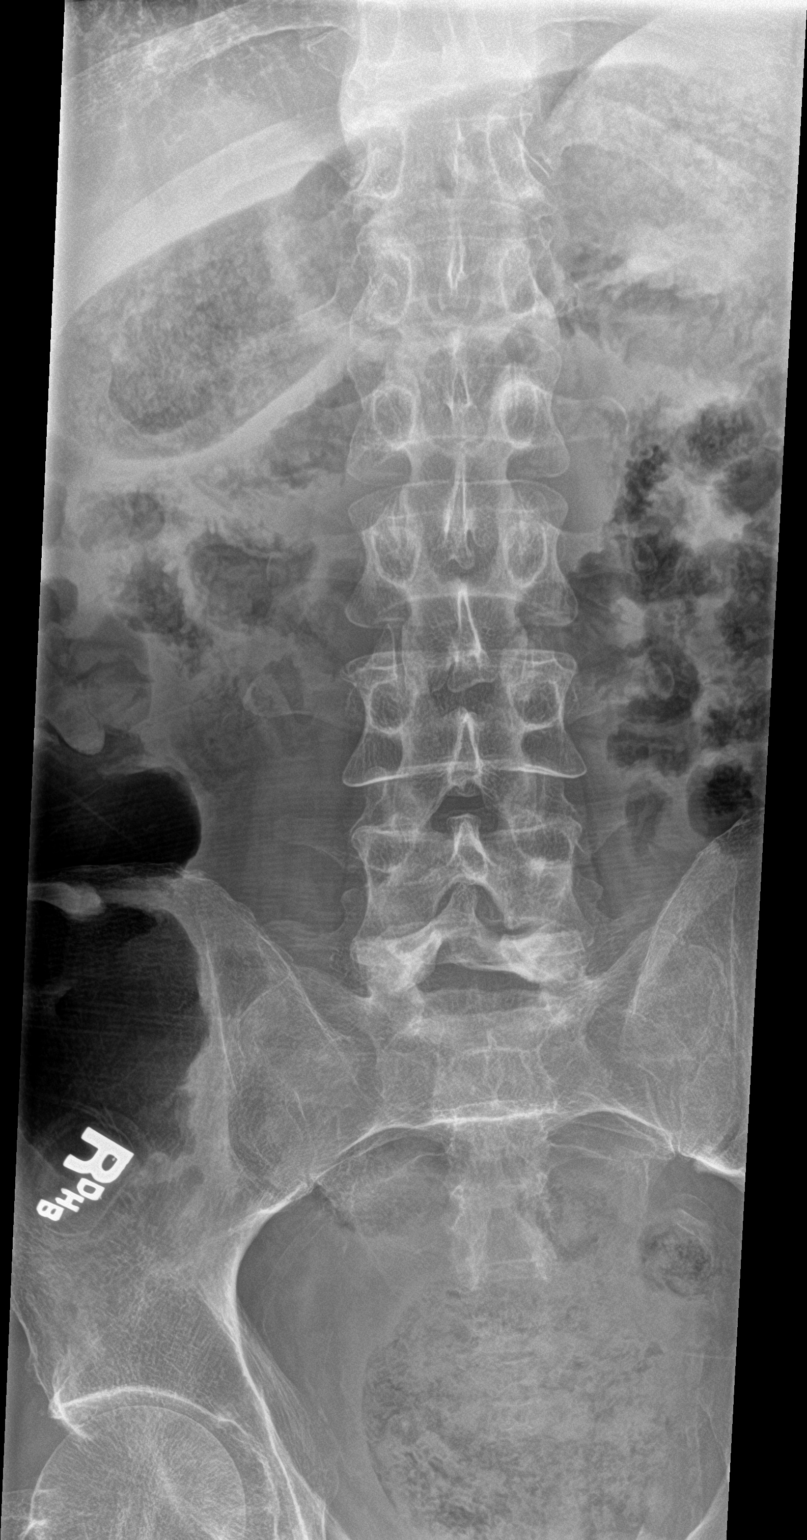

[l-spine lat]
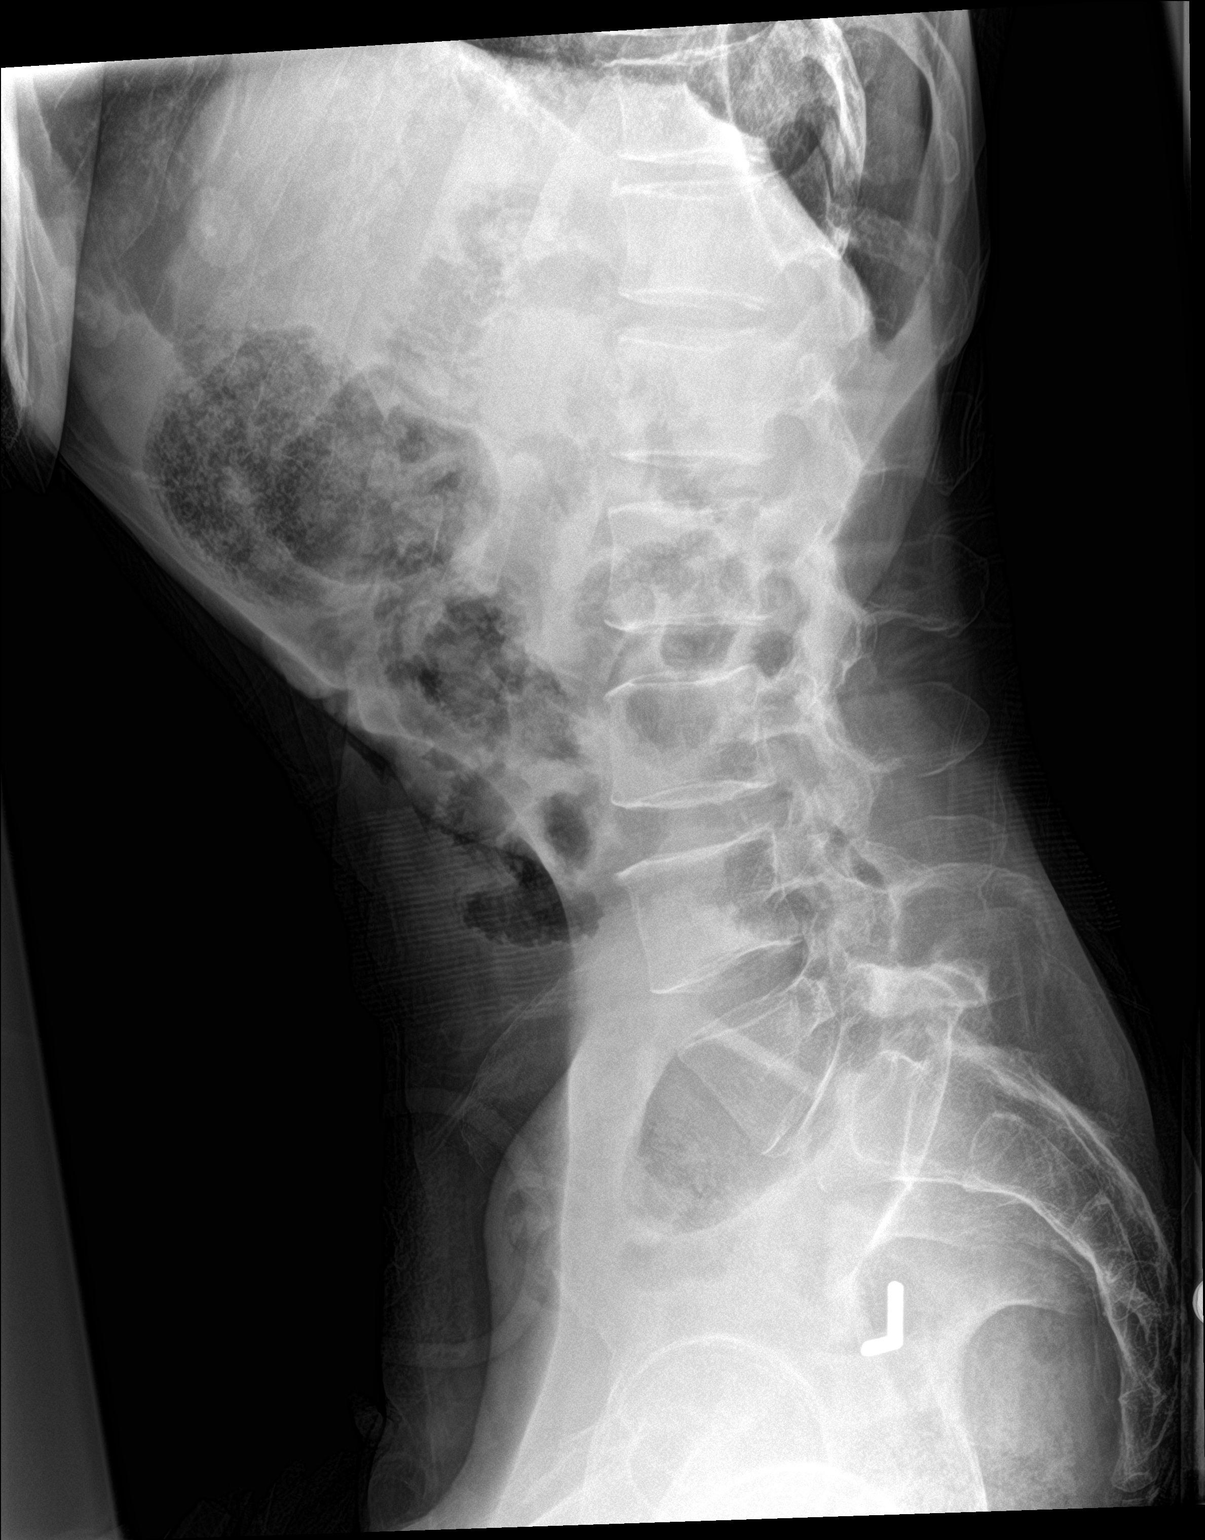

[l-spine spot]
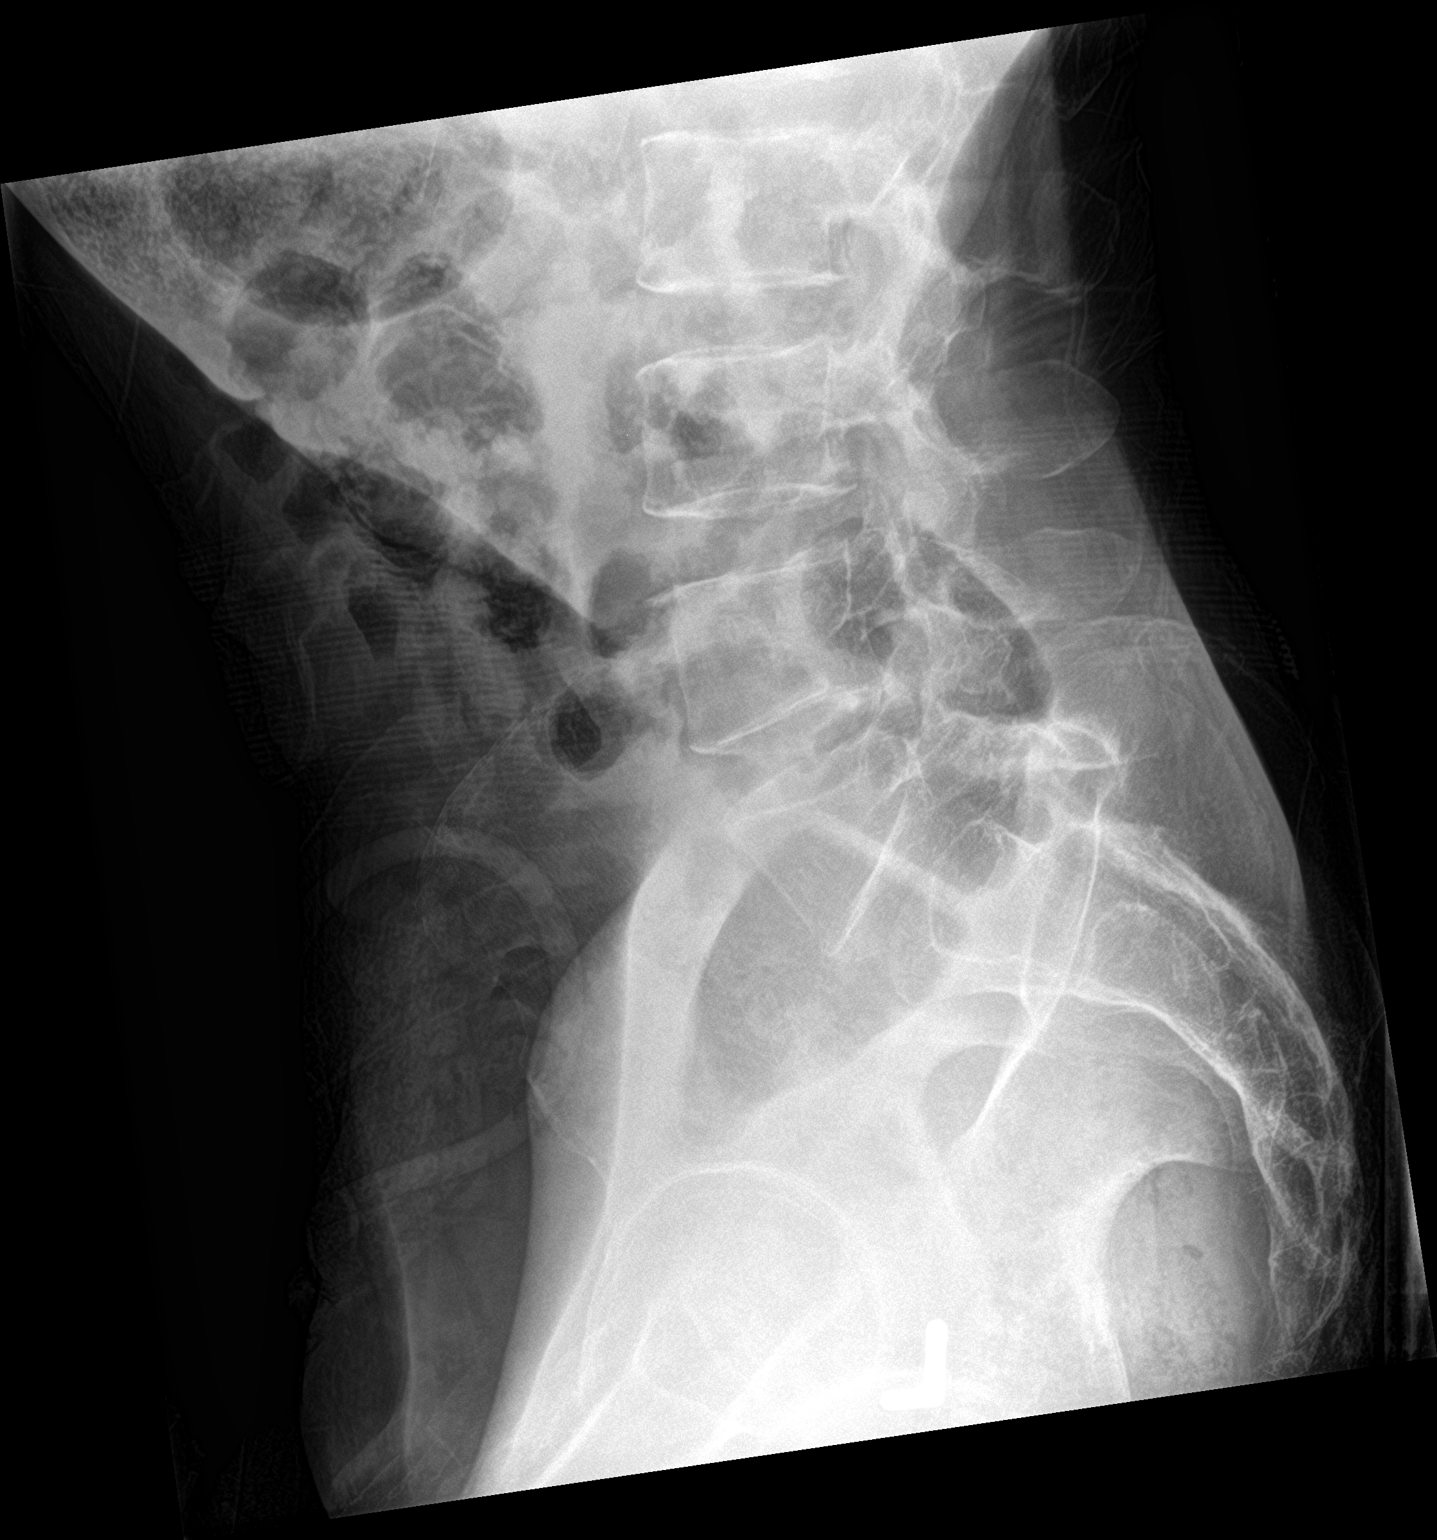

[3 of 3 positions shown; findings below may reference images not displayed]

FINDINGS: Thoracic spine: Frontal and lateral views of the thoracic spine are
obtained. There is mild right convex scoliosis. Mild anterior wedge
compression deformity is identified within the T11 vertebral body,
less than 10% loss of height. This is new since CT [DATE]. No
other acute bony abnormalities. Paraspinal soft tissues are
unremarkable.

Lumbar spine: Frontal and lateral views demonstrate 5
non-rib-bearing lumbar type vertebral bodies in anatomic alignment.
No acute fractures. Disc spaces are well preserved. Mild facet
hypertrophic changes at the lumbosacral junction. Sacroiliac joints
are normal.
IMPRESSION: 1. Mild anterior wedging of the T11 vertebral body, new since recent
CT, consistent with acute to subacute compression fracture. Less
than 10% loss of height.
2. Otherwise unremarkable thoracolumbar spine.

## 2020-09-11 MED ORDER — SODIUM CHLORIDE 0.9 % IV BOLUS
1000.0000 mL | Freq: Once | INTRAVENOUS | Status: AC
  Administered 2020-09-11: 1000 mL via INTRAVENOUS

## 2020-09-11 MED ORDER — MORPHINE SULFATE (PF) 2 MG/ML IV SOLN
2.0000 mg | Freq: Once | INTRAVENOUS | Status: AC
  Administered 2020-09-11: 2 mg via INTRAVENOUS
  Filled 2020-09-11: qty 1

## 2020-09-11 NOTE — ED Triage Notes (Signed)
Pt comes with c/o weakness and CP. Pt states the pain is so bad in his chest. Pt states some SOB.  Current BP-62/41 checked twice. Pt states multiple falls and is so weak.  Pt vomiting in triage.

## 2020-09-11 NOTE — ED Notes (Signed)
Patient given sandwich tray and orange juice.

## 2020-09-11 NOTE — ED Notes (Addendum)
Pt taken to ED 15 RN at bedside. RN informed of needing EKG still.

## 2020-09-11 NOTE — ED Provider Notes (Signed)
[facility] Emergency Department Provider Note ____________________________________________   First MD Initiated Contact with Patient 09/11/20 1702     (approximate)  I have reviewed the triage vital signs and the nursing notes.   HISTORY  Chief Complaint Chest Pain and Weakness    HPI Ademide Schaberg is a 44 y.o. male with PMH as noted below including diabetes, gastroparesis, and recurrent UTI who presents with multiple complaints.  Most significantly, the patient reports increased generalized weakness over the last several days.  He has had decreased appetite and nausea but no vomiting.  He also reports chest pain and upper back pain today but no cough or significant shortness of breath.  He reports several episodes of "falling out," which he describes as losing consciousness due to being weak.  He has a urinary catheter in place and states that his urine has been flowing normally and does not appear cloudy.  He has no pain associated with this.  He denies any diarrhea.   Past Medical History:  Diagnosis Date  . COVID-19   . Diabetes mellitus without complication ([place])   . Gastroparesis   . Tuberculosis     Patient Active Problem List   Diagnosis Date Noted  . Pain in right lower leg   . Acidosis   . Acute lower UTI   . Sepsis secondary to UTI ([place]) 06/12/2020  . E-coli UTI 06/12/2020  . UTI due to Klebsiella species 06/12/2020  . Hypothermia   . Hyponatremia   . Encephalopathy 06/09/2020  . Neurogenic bladder   . Penile abscess 05/06/2020  . FTT (failure to thrive) in adult 05/03/2020  . Weakness 05/02/2020  . CKD (chronic kidney disease), stage IV ([place]) 05/02/2020  . Type 1 diabetes mellitus with diabetic chronic kidney disease ([place])   . Goals of care, counseling/discussion   . Palliative care by specialist   . Recurrent major depression-severe ([place]) 03/31/2020  . Hyperkalemia   . Iron deficiency anemia   . Weight loss   .  Chronic diarrhea 02/07/2020  . Abnormal CT of the chest   . Acute urinary retention 02/06/2020  . Hyperglycemia due to type 2 diabetes mellitus ([place]) 02/06/2020  . History of Clostridium difficile colitis 02/06/2020  . Generalized weakness 02/06/2020  . History of COVID-19 02/06/2020  . Personal history of other infectious and parasitic diseases 02/06/2020  . Asthenia 02/06/2020  . Diarrhea 01/21/2020  . Tobacco abuse 01/21/2020  . C. difficile colitis 01/21/2020  . Urinary retention 01/21/2020  . Diabetes mellitus without complication ([place]) 84/16/6063  . Protein-calorie malnutrition, severe 01/02/2020  . Odynophagia   . DKA (diabetic ketoacidoses) 12/27/2019  . Depression 12/27/2019  . DKA, type 2 ([place]) 12/27/2019  . Anemia of chronic disease 04/25/2019  . Hypomagnesemia 04/25/2019  . Hypotension 04/23/2019  . CAP (community acquired pneumonia) due to MSSA (methicillin sensitive Staphylococcus aureus) ([place]) 04/14/2019  . COVID-19 virus infection 04/14/2019  . Pressure injury of skin 03/31/2019  . Acute renal failure (ARF) ([place]) 03/06/2019  . CAP (community acquired pneumonia) 02/14/2019  . AKI (acute kidney injury) ([place]) 02/02/2019  . Acute kidney failure, unspecified ([place]) 02/02/2019  . ARF (acute renal failure) ([place]) 01/03/2019  . Malnutrition of moderate degree 02/24/2018  . GERD (gastroesophageal reflux disease) 02/23/2018  . Diabetic gastroparesis ([place]) 02/23/2018  . Diabetic foot ulcer ([place]) 02/23/2018  . Aspiration pneumonia ([place]) 04/21/2017  . Hypoglycemia 04/21/2017  . Diabetes mellitus with hyperglycemia ([place]) 04/21/2017  . Hip fracture, unspecified laterality, closed, initial encounter (  [place]) 04/17/2017  . Hyperglycemia 04/17/2017  . Anisocoria 02/26/2017  . Adjustment disorder with depressed mood 07/02/2016  . Hypocalcemia 07/02/2016  . Personal history of tuberculosis 07/02/2016  . Malnutrition ([place]) 04/09/2016  . Cavitary lesion of lung 04/06/2016  . Other  disorders of lung 04/06/2016    Past Surgical History:  Procedure Laterality Date  . COLONOSCOPY N/A 01/25/2020   Procedure: COLONOSCOPY;  Surgeon: Toledo, Benay Pike, MD;  Location: [facility] ENDOSCOPY;  Service: Gastroenterology;  Laterality: N/A;  . ESOPHAGOGASTRODUODENOSCOPY N/A 02/03/2019   Procedure: ESOPHAGOGASTRODUODENOSCOPY (EGD);  Surgeon: Toledo, Benay Pike, MD;  Location: [facility] ENDOSCOPY;  Service: Gastroenterology;  Laterality: N/A;  . IR CATHETER TUBE CHANGE  06/13/2020    Prior to Admission medications   Medication Sig Start Date End Date Taking? Authorizing Provider  aspirin EC 81 MG tablet Take 81 mg by mouth daily.    [provider]  benztropine (COGENTIN) 0.5 MG tablet Take 1 tablet (0.5 mg total) by mouth 2 (two) times daily. 04/09/20   Pokhrel, Corrie Mckusick, MD  colesevelam (WELCHOL) 625 MG tablet Take 3 tablets (1,875 mg total) by mouth 2 (two) times daily with a meal. 07/31/20   Lavina Hamman, MD  DULoxetine (CYMBALTA) 30 MG capsule Take 1 capsule (30 mg total) by mouth daily. 04/10/20   Pokhrel, Corrie Mckusick, MD  feeding supplement, GLUCERNA SHAKE, (GLUCERNA SHAKE) LIQD Take 237 mLs by mouth 3 (three) times daily between meals. 05/13/20   Fritzi Mandes, MD  ferrous sulfate 325 (65 FE) MG EC tablet Take 1 tablet (325 mg total) by mouth 3 (three) times daily with meals. 07/31/20 11/28/20  Lavina Hamman, MD  haloperidol (HALDOL) 0.5 MG tablet Take 1 tablet (0.5 mg total) by mouth 2 (two) times daily. 04/09/20   Pokhrel, Corrie Mckusick, MD  insulin glargine (LANTUS) 100 UNIT/ML injection Inject 0.12 mLs (12 Units total) into the skin daily. 08/23/20   Nolberto Hanlon, MD  loperamide (IMODIUM) 2 MG capsule Take 1 capsule (2 mg total) by mouth 4 (four) times daily as needed for diarrhea or loose stools. 07/31/20   Lavina Hamman, MD  megestrol (MEGACE) 400 MG/10ML suspension Take 5 mLs (200 mg total) by mouth daily. 08/01/20   Lavina Hamman, MD  midodrine (PROAMATINE) 2.5 MG tablet Take 1 tablet (2.5  mg total) by mouth 3 (three) times daily with meals. 08/21/20 09/20/20  Nolberto Hanlon, MD  Multiple Vitamin (MULTIVITAMIN WITH MINERALS) TABS tablet Take 1 tablet by mouth daily. 05/13/20   Fritzi Mandes, MD  NEXIUM 40 MG capsule Take 1 capsule (40 mg total) by mouth daily. 07/31/20   Lavina Hamman, MD  patiromer (VELTASSA) 8.4 g packet Take 1 packet (8.4 g total) by mouth daily. 07/31/20   Lavina Hamman, MD  sodium bicarbonate 650 MG tablet Take 2 tablets (1,300 mg total) by mouth 2 (two) times daily. 07/31/20   Lavina Hamman, MD    Allergies Patient has no known allergies.  Family History  Problem Relation Age of Onset  . Diabetes Mother   . Diabetes Father     Social History Social History   Tobacco Use  . Smoking status: Former Smoker    Types: Cigars, Cigarettes  . Smokeless tobacco: Never Used  Vaping Use  . Vaping Use: Never used  Substance Use Topics  . Alcohol use: No  . Drug use: No    Review of Systems  Constitutional: No fever.  Positive for weakness. Eyes: No visual changes. ENT: No sore throat.  Cardiovascular: Positive for chest pain. Respiratory: Denies shortness of breath. Gastrointestinal: Positive for nausea.  No diarrhea. Genitourinary: Negative for dysuria.  Musculoskeletal: Positive for for back pain. Skin: Negative for rash. Neurological: Negative for headache.   ____________________________________________   PHYSICAL EXAM:  VITAL SIGNS: ED Triage Vitals  Enc Vitals Group     BP 09/11/20 1634 (!) 62/41     Pulse Rate 09/11/20 1634 78     Resp 09/11/20 1634 17     Temp 09/11/20 1634 97.6 F (36.4 C)     Temp src --      SpO2 09/11/20 1634 100 %     Weight 09/11/20 1636 103 lb 2.8 oz (46.8 kg)     Height 09/11/20 1636 5\' 8"  (1.727 m)     Head Circumference --      Peak Flow --      Pain Score 09/11/20 1636 10     Pain Loc --      Pain Edu? --      Excl. in [place]? --     Constitutional: Alert and oriented.  Chronically ill and weak  appearing but in no acute distress. Eyes: Conjunctivae are normal.  No scleral icterus. Head: Atraumatic. Nose: No congestion/rhinnorhea. Mouth/Throat: Mucous membranes are dry.   Neck: Normal range of motion.  Cardiovascular: Normal rate, regular rhythm. Grossly normal heart sounds.  Good peripheral circulation. Respiratory: Normal respiratory effort.  No retractions. Lungs CTAB. Gastrointestinal: Soft and nontender.  Scaphoid abdomen, no distention.  Genitourinary: No flank tenderness. Musculoskeletal: No lower extremity edema.  Extremities warm and well perfused.  Neurologic:  Normal speech and language. No gross focal neurologic deficits are appreciated.  Skin:  Skin is warm and dry. No rash noted. Psychiatric: Calm and cooperative.  ____________________________________________   LABS (all labs ordered are listed, but only abnormal results are displayed)  Labs Reviewed  BASIC METABOLIC PANEL - Abnormal; Notable for the following components:      Result Value   Sodium 131 (*)    CO2 19 (*)    Glucose, Bld 331 (*)    BUN 29 (*)    Creatinine, Ser 2.42 (*)    Calcium 8.7 (*)    GFR, Estimated 33 (*)    All other components within normal limits  CBC - Abnormal; Notable for the following components:   RBC 2.43 (*)    Hemoglobin 7.8 (*)    HCT 23.0 (*)    All other components within normal limits  URINALYSIS, COMPLETE (UACMP) WITH MICROSCOPIC - Abnormal; Notable for the following components:   Color, Urine YELLOW (*)    APPearance TURBID (*)    Hgb urine dipstick MODERATE (*)    Protein, ur 100 (*)    Leukocytes,Ua MODERATE (*)    WBC, UA >50 (*)    Bacteria, UA MANY (*)    All other components within normal limits  BASIC METABOLIC PANEL - Abnormal; Notable for the following components:   Sodium 131 (*)    CO2 19 (*)    Glucose, Bld 391 (*)    BUN 29 (*)    Creatinine, Ser 2.10 (*)    Calcium 7.7 (*)    GFR, Estimated 39 (*)    All other components within normal  limits  CBG MONITORING, ED - Abnormal; Notable for the following components:   Glucose-Capillary 281 (*)    All other components within normal limits  RESP PANEL BY RT-PCR (FLU A&B, COVID) ARPGX2  BRAIN NATRIURETIC PEPTIDE  TROPONIN I (HIGH SENSITIVITY)  TROPONIN I (HIGH SENSITIVITY)   ____________________________________________  EKG  ED ECG REPORT I, Arta Silence, the attending physician, personally viewed and interpreted this ECG.  Date: 09/11/2020 EKG Time: 1711 Rate: 81 Rhythm: normal sinus rhythm QRS Axis: Right axis Intervals: normal ST/T Wave abnormalities: Nonspecific ST abnormalities Narrative Interpretation: Nonspecific abnormalities with no evidence of acute ischemia  ____________________________________________  RADIOLOGY  CXR interpreted by me shows no focal infiltrate or edema  ____________________________________________   PROCEDURES  Procedure(s) performed: No  Procedures  Critical Care performed: No ____________________________________________   INITIAL IMPRESSION / ASSESSMENT AND PLAN / ED COURSE  Pertinent labs & imaging results that were available during my care of the patient were reviewed by me and considered in my medical decision making (see chart for details).  44 year old male with PMH as noted above including diabetes who is chronically ill with numerous prior ED visits and admissions presents with generalized weakness, possible syncope, decreased appetite, as well as some chest and back pain.  I reviewed the past medical records in [place].  The patient was most recent admitted last month with generalized weakness and right leg pain.  There was initially suspicion for UTI, however antibiotics were discontinued as his abnormal urinalysis with thought to be due to colonization.  He has recurrent hypotension thought to be related to diarrhea.  He has had multiple prior ED visits and admissions for similar presentations.  On exam, the  patient is chronically ill and emaciated appearing similar to his appearance on prior visits that I have been involved with.  His vital signs are normal except for hypotension.  He is alert and oriented.  Lungs are clear.  The abdomen is nontender.  Exam is otherwise unremarkable for focal findings.  Differential is broad but includes recurrent dehydration, AKI, hyperglycemia/DKA, or possible infection.  We will give fluids, obtain lab work-up, and reassess.  ----------------------------------------- 11:15 PM on 09/11/2020 -----------------------------------------  The lab work-up has been generally reassuring. The patient is chronically anemic with no acute change. Initial creatinine was somewhat elevated from baseline consistent with AKI, however it has trended down after fluids. Troponins are negative. UA shows significant WBCs but this is consistent with colonization and there is no clinical evidence for acute UTI. The patient is tolerating p.o.  Overall, at this time, I do not think that there is any specific benefit to admission given that the patient is tolerating p.o., is doing well after hydration, and appears to be having an exacerbation of chronic symptoms. When I discussed this with the patient, he states he still feels weak. He is still quite concerned about his back pain. The patient has no midline spinal tenderness. The back pain appears to be quite diffuse and nonspecific. However given his overall frail habitus and the recent syncopal episodes and possible falls, we will obtain x-rays to rule out acute fracture. If these are negative I anticipate possible discharge home. I signed him out to the oncoming physician Dr. Tamala Julian.  ____________________________________________   FINAL CLINICAL IMPRESSION(S) / ED DIAGNOSES  Final diagnoses:  Generalized weakness  Dehydration  Acute bilateral back pain, unspecified back location  Atypical chest pain      NEW MEDICATIONS STARTED  DURING THIS VISIT:  New Prescriptions   No medications on file     Note:  This document was prepared using Dragon voice recognition software and may include unintentional dictation errors.    Arta Silence, MD 09/12/20 [phone]

## 2020-09-12 ENCOUNTER — Encounter: Payer: Self-pay | Admitting: Emergency Medicine

## 2020-09-12 ENCOUNTER — Emergency Department
Admission: EM | Admit: 2020-09-12 | Discharge: 2020-09-12 | Disposition: A | Discharge status: Home / Self Care | Payer: Self-pay | Attending: Emergency Medicine | Admitting: Emergency Medicine

## 2020-09-12 DIAGNOSIS — Z87891 Personal history of nicotine dependence: Secondary | ICD-10-CM | POA: Insufficient documentation

## 2020-09-12 DIAGNOSIS — W19XXXA Unspecified fall, initial encounter: Secondary | ICD-10-CM | POA: Insufficient documentation

## 2020-09-12 DIAGNOSIS — I129 Hypertensive chronic kidney disease with stage 1 through stage 4 chronic kidney disease, or unspecified chronic kidney disease: Secondary | ICD-10-CM | POA: Insufficient documentation

## 2020-09-12 DIAGNOSIS — N184 Chronic kidney disease, stage 4 (severe): Secondary | ICD-10-CM | POA: Insufficient documentation

## 2020-09-12 DIAGNOSIS — E1110 Type 2 diabetes mellitus with ketoacidosis without coma: Secondary | ICD-10-CM | POA: Insufficient documentation

## 2020-09-12 DIAGNOSIS — Z7982 Long term (current) use of aspirin: Secondary | ICD-10-CM | POA: Insufficient documentation

## 2020-09-12 DIAGNOSIS — Z794 Long term (current) use of insulin: Secondary | ICD-10-CM | POA: Insufficient documentation

## 2020-09-12 DIAGNOSIS — I959 Hypotension, unspecified: Secondary | ICD-10-CM | POA: Insufficient documentation

## 2020-09-12 DIAGNOSIS — E1165 Type 2 diabetes mellitus with hyperglycemia: Secondary | ICD-10-CM | POA: Insufficient documentation

## 2020-09-12 DIAGNOSIS — Z8616 Personal history of COVID-19: Secondary | ICD-10-CM | POA: Insufficient documentation

## 2020-09-12 DIAGNOSIS — E1122 Type 2 diabetes mellitus with diabetic chronic kidney disease: Secondary | ICD-10-CM | POA: Insufficient documentation

## 2020-09-12 LAB — COMPREHENSIVE METABOLIC PANEL
ALT: 25 U/L (ref 0–44)
AST: 16 U/L (ref 15–41)
Albumin: 3 g/dL — ABNORMAL LOW (ref 3.5–5.0)
Alkaline Phosphatase: 85 U/L (ref 38–126)
Anion gap: 8 (ref 5–15)
BUN: 30 mg/dL — ABNORMAL HIGH (ref 6–20)
CO2: 18 mmol/L — ABNORMAL LOW (ref 22–32)
Calcium: 8.6 mg/dL — ABNORMAL LOW (ref 8.9–10.3)
Chloride: 106 mmol/L (ref 98–111)
Creatinine, Ser: 2 mg/dL — ABNORMAL HIGH (ref 0.61–1.24)
GFR, Estimated: 41 mL/min — ABNORMAL LOW (ref >60)
Glucose, Bld: 367 mg/dL — ABNORMAL HIGH (ref 70–99)
Potassium: 4.8 mmol/L (ref 3.5–5.1)
Sodium: 132 mmol/L — ABNORMAL LOW (ref 135–145)
Total Bilirubin: 0.6 mg/dL (ref 0.3–1.2)
Total Protein: 6.3 g/dL — ABNORMAL LOW (ref 6.5–8.1)

## 2020-09-12 LAB — CBC
HCT: 22.6 % — ABNORMAL LOW (ref 39.0–52.0)
Hemoglobin: 7.8 g/dL — ABNORMAL LOW (ref 13.0–17.0)
MCH: 32.1 pg (ref 26.0–34.0)
MCHC: 34.5 g/dL (ref 30.0–36.0)
MCV: 93 fL (ref 80.0–100.0)
Platelets: 317 10*3/uL (ref 150–400)
RBC: 2.43 MIL/uL — ABNORMAL LOW (ref 4.22–5.81)
RDW: 13.4 % (ref 11.5–15.5)
WBC: 6 10*3/uL (ref 4.0–10.5)
nRBC: 0 % (ref 0.0–0.2)

## 2020-09-12 LAB — CBG MONITORING, ED: Glucose-Capillary: 330 mg/dL — ABNORMAL HIGH (ref 70–99)

## 2020-09-12 LAB — BETA-HYDROXYBUTYRIC ACID: Beta-Hydroxybutyric Acid: 0.33 mmol/L — ABNORMAL HIGH (ref 0.05–0.27)

## 2020-09-12 LAB — LIPASE, BLOOD: Lipase: 31 U/L (ref 11–51)

## 2020-09-12 MED ORDER — SODIUM CHLORIDE 0.9 % IV SOLN
1000.0000 mL | Freq: Once | INTRAVENOUS | Status: AC
  Administered 2020-09-12: 1000 mL via INTRAVENOUS

## 2020-09-12 MED ORDER — METHOCARBAMOL 500 MG PO TABS
500.0000 mg | ORAL_TABLET | Freq: Once | ORAL | Status: AC
  Administered 2020-09-12: 500 mg via ORAL
  Filled 2020-09-12 (×2): qty 1

## 2020-09-12 MED ORDER — ACETAMINOPHEN 500 MG PO TABS
1000.0000 mg | ORAL_TABLET | Freq: Once | ORAL | Status: AC
  Administered 2020-09-12: 1000 mg via ORAL
  Filled 2020-09-12: qty 2

## 2020-09-12 NOTE — ED Provider Notes (Signed)
  Patient received in signout from Dr. Cherylann Banas pending plain films of patient's spine.  Plain films reviewed with a 10% compression wedge deformity to T11.  Overall, patient reports his symptoms are improved after fluids and treatment of his dehydration.  We discussed pain control for his compression deformity and no indications for operative management due to his controlled pain and no neurologic deficits.  We discussed return precautions for the ED.  Patient medically stable for discharge home.   Vladimir Crofts, MD 09/12/20 [phone]

## 2020-09-12 NOTE — Discharge Instructions (Addendum)
Use Tylenol for pain and fevers.  Up to 1000 mg per dose, up to 4 times per day.  Do not take more than 4000 mg of Tylenol/acetaminophen within 24 hours..  Continue your medications for diabetes and follow-up with your primary care physician this week.   Return to the ED with any worsening symptoms.

## 2020-09-12 NOTE — ED Provider Notes (Signed)
[facility] Emergency Department Provider Note   ____________________________________________    I have reviewed the triage vital signs and the nursing notes.   HISTORY  Chief Complaint Fall and Hypotension     HPI Quinnton Bury is a 44 y.o. male with a history of diabetes, gastroparesis, chronic kidney disease who presents after a fall after being discharged last night.  Patient has been evaluated and hydrated.  Overnight found to have a T11 10% compression fracture.  Been doing better and was discharged.  Sent the waiting room for many hours calling for a ride, apparently fell from a standing position outside.  Patient states that he feels fine at this time, no injuries reported.  Initially found to be hypotensive which is essentially chronic for him at this point, multiple ED visits for similar complaints.  Past Medical History:  Diagnosis Date  . COVID-19   . Diabetes mellitus without complication ([place])   . Gastroparesis   . Tuberculosis     Patient Active Problem List   Diagnosis Date Noted  . Pain in right lower leg   . Acidosis   . Acute lower UTI   . Sepsis secondary to UTI ([place]) 06/12/2020  . E-coli UTI 06/12/2020  . UTI due to Klebsiella species 06/12/2020  . Hypothermia   . Hyponatremia   . Encephalopathy 06/09/2020  . Neurogenic bladder   . Penile abscess 05/06/2020  . FTT (failure to thrive) in adult 05/03/2020  . Weakness 05/02/2020  . CKD (chronic kidney disease), stage IV ([place]) 05/02/2020  . Type 1 diabetes mellitus with diabetic chronic kidney disease ([place])   . Goals of care, counseling/discussion   . Palliative care by specialist   . Recurrent major depression-severe ([place]) 03/31/2020  . Hyperkalemia   . Iron deficiency anemia   . Weight loss   . Chronic diarrhea 02/07/2020  . Abnormal CT of the chest   . Acute urinary retention 02/06/2020  . Hyperglycemia due to type 2 diabetes mellitus ([place]) 02/06/2020  .  History of Clostridium difficile colitis 02/06/2020  . Generalized weakness 02/06/2020  . History of COVID-19 02/06/2020  . Personal history of other infectious and parasitic diseases 02/06/2020  . Asthenia 02/06/2020  . Diarrhea 01/21/2020  . Tobacco abuse 01/21/2020  . C. difficile colitis 01/21/2020  . Urinary retention 01/21/2020  . Diabetes mellitus without complication ([place]) 60/73/7106  . Protein-calorie malnutrition, severe 01/02/2020  . Odynophagia   . DKA (diabetic ketoacidoses) 12/27/2019  . Depression 12/27/2019  . DKA, type 2 ([place]) 12/27/2019  . Anemia of chronic disease 04/25/2019  . Hypomagnesemia 04/25/2019  . Hypotension 04/23/2019  . CAP (community acquired pneumonia) due to MSSA (methicillin sensitive Staphylococcus aureus) ([place]) 04/14/2019  . COVID-19 virus infection 04/14/2019  . Pressure injury of skin 03/31/2019  . Acute renal failure (ARF) ([place]) 03/06/2019  . CAP (community acquired pneumonia) 02/14/2019  . AKI (acute kidney injury) ([place]) 02/02/2019  . Acute kidney failure, unspecified ([place]) 02/02/2019  . ARF (acute renal failure) ([place]) 01/03/2019  . Malnutrition of moderate degree 02/24/2018  . GERD (gastroesophageal reflux disease) 02/23/2018  . Diabetic gastroparesis ([place]) 02/23/2018  . Diabetic foot ulcer ([place]) 02/23/2018  . Aspiration pneumonia ([place]) 04/21/2017  . Hypoglycemia 04/21/2017  . Diabetes mellitus with hyperglycemia ([place]) 04/21/2017  . Hip fracture, unspecified laterality, closed, initial encounter ([place]) 04/17/2017  . Hyperglycemia 04/17/2017  . Anisocoria 02/26/2017  . Adjustment disorder with depressed mood 07/02/2016  . Hypocalcemia 07/02/2016  . Personal history of tuberculosis  07/02/2016  . Malnutrition ([place]) 04/09/2016  . Cavitary lesion of lung 04/06/2016  . Other disorders of lung 04/06/2016    Past Surgical History:  Procedure Laterality Date  . COLONOSCOPY N/A 01/25/2020   Procedure: COLONOSCOPY;  Surgeon: Toledo, Benay Pike, MD;  Location: [facility] ENDOSCOPY;  Service: Gastroenterology;  Laterality: N/A;  . ESOPHAGOGASTRODUODENOSCOPY N/A 02/03/2019   Procedure: ESOPHAGOGASTRODUODENOSCOPY (EGD);  Surgeon: Toledo, Benay Pike, MD;  Location: [facility] ENDOSCOPY;  Service: Gastroenterology;  Laterality: N/A;  . IR CATHETER TUBE CHANGE  06/13/2020    Prior to Admission medications   Medication Sig Start Date End Date Taking? Authorizing Provider  aspirin EC 81 MG tablet Take 81 mg by mouth daily.    [provider]  benztropine (COGENTIN) 0.5 MG tablet Take 1 tablet (0.5 mg total) by mouth 2 (two) times daily. 04/09/20   Pokhrel, Corrie Mckusick, MD  colesevelam (WELCHOL) 625 MG tablet Take 3 tablets (1,875 mg total) by mouth 2 (two) times daily with a meal. 07/31/20   Lavina Hamman, MD  DULoxetine (CYMBALTA) 30 MG capsule Take 1 capsule (30 mg total) by mouth daily. 04/10/20   Pokhrel, Corrie Mckusick, MD  feeding supplement, GLUCERNA SHAKE, (GLUCERNA SHAKE) LIQD Take 237 mLs by mouth 3 (three) times daily between meals. 05/13/20   Fritzi Mandes, MD  ferrous sulfate 325 (65 FE) MG EC tablet Take 1 tablet (325 mg total) by mouth 3 (three) times daily with meals. 07/31/20 11/28/20  Lavina Hamman, MD  haloperidol (HALDOL) 0.5 MG tablet Take 1 tablet (0.5 mg total) by mouth 2 (two) times daily. 04/09/20   Pokhrel, Corrie Mckusick, MD  insulin glargine (LANTUS) 100 UNIT/ML injection Inject 0.12 mLs (12 Units total) into the skin daily. 08/23/20   Nolberto Hanlon, MD  loperamide (IMODIUM) 2 MG capsule Take 1 capsule (2 mg total) by mouth 4 (four) times daily as needed for diarrhea or loose stools. 07/31/20   Lavina Hamman, MD  megestrol (MEGACE) 400 MG/10ML suspension Take 5 mLs (200 mg total) by mouth daily. 08/01/20   Lavina Hamman, MD  midodrine (PROAMATINE) 2.5 MG tablet Take 1 tablet (2.5 mg total) by mouth 3 (three) times daily with meals. 08/21/20 09/20/20  Nolberto Hanlon, MD  Multiple Vitamin (MULTIVITAMIN WITH MINERALS) TABS tablet Take 1 tablet by  mouth daily. 05/13/20   Fritzi Mandes, MD  NEXIUM 40 MG capsule Take 1 capsule (40 mg total) by mouth daily. 07/31/20   Lavina Hamman, MD  patiromer (VELTASSA) 8.4 g packet Take 1 packet (8.4 g total) by mouth daily. 07/31/20   Lavina Hamman, MD  sodium bicarbonate 650 MG tablet Take 2 tablets (1,300 mg total) by mouth 2 (two) times daily. 07/31/20   Lavina Hamman, MD     Allergies Patient has no known allergies.  Family History  Problem Relation Age of Onset  . Diabetes Mother   . Diabetes Father     Social History Social History   Tobacco Use  . Smoking status: Former Smoker    Types: Cigars, Cigarettes  . Smokeless tobacco: Never Used  Vaping Use  . Vaping Use: Never used  Substance Use Topics  . Alcohol use: No  . Drug use: No    Review of Systems  Constitutional: No fever/chills Eyes: No visual changes.  ENT: No sore throat. Cardiovascular: Denies chest pain. Respiratory: Denies shortness of breath. Gastrointestinal: No abdominal pain. Genitourinary: Negative for dysuria. Musculoskeletal: As above Skin: Negative for rash. Neurological: Negative for headaches  ____________________________________________   PHYSICAL EXAM:  VITAL SIGNS: ED Triage Vitals  Enc Vitals Group     BP 09/12/20 1124 (!) 77/54     Pulse Rate 09/12/20 1124 (!) 134     Resp 09/12/20 1124 16     Temp 09/12/20 1148 97.6 F (36.4 C)     Temp Source 09/12/20 1148 Oral     SpO2 09/12/20 1124 97 %     Weight 09/12/20 1125 46.8 kg (103 lb 2.8 oz)     Height 09/12/20 1125 1.727 m (5\' 8" )     Head Circumference --      Peak Flow --      Pain Score 09/12/20 1124 10     Pain Loc --      Pain Edu? --      Excl. in [place]? --     Constitutional: Alert and oriented.   Nose: No congestion/rhinnorhea. Mouth/Throat: Mucous membranes are moist.    Cardiovascular: Normal rate, regular rhythm. Grossly normal heart sounds.  Good peripheral circulation. Respiratory: Normal respiratory  effort.  No retractions. Lungs CTAB. Gastrointestinal: Soft and nontender. No distention.    Musculoskeletal: No lower extremity tenderness nor edema.  Warm and well perfused Neurologic:  Normal speech and language. No gross focal neurologic deficits are appreciated.  Skin:  Skin is warm, dry and intact. No rash noted. Psychiatric: Mood and affect are normal. Speech and behavior are normal.  ____________________________________________   LABS (all labs ordered are listed, but only abnormal results are displayed)  Labs Reviewed  CBC - Abnormal; Notable for the following components:      Result Value   RBC 2.43 (*)    Hemoglobin 7.8 (*)    HCT 22.6 (*)    All other components within normal limits  COMPREHENSIVE METABOLIC PANEL - Abnormal; Notable for the following components:   Sodium 132 (*)    CO2 18 (*)    Glucose, Bld 367 (*)    BUN 30 (*)    Creatinine, Ser 2.00 (*)    Calcium 8.6 (*)    Total Protein 6.3 (*)    Albumin 3.0 (*)    GFR, Estimated 41 (*)    All other components within normal limits  BLOOD GAS, VENOUS - Abnormal; Notable for the following components:   pCO2, Ven 43 (*)    Bicarbonate 19.7 (*)    Acid-base deficit 6.9 (*)    All other components within normal limits  BETA-HYDROXYBUTYRIC ACID - Abnormal; Notable for the following components:   Beta-Hydroxybutyric Acid 0.33 (*)    All other components within normal limits  CBG MONITORING, ED - Abnormal; Notable for the following components:   Glucose-Capillary 330 (*)    All other components within normal limits  LIPASE, BLOOD   ____________________________________________  EKG  ED ECG REPORT I, [name], the attending physician, personally viewed and interpreted this ECG.  Date: 09/12/2020  Rhythm: normal sinus rhythm QRS Axis: normal Intervals: normal ST/T Wave abnormalities: normal Narrative Interpretation: no evidence of acute  ischemia  ____________________________________________  RADIOLOGY  Reviewed x-rays from last night ____________________________________________   PROCEDURES  Procedure(s) performed: No  Procedures   Critical Care performed: No ____________________________________________   INITIAL IMPRESSION / ASSESSMENT AND PLAN / ED COURSE  Pertinent labs & imaging results that were available during my care of the patient were reviewed by me and considered in my medical decision making (see chart for details).  Patient presents after a fall.  Initially blood pressure low  which is not atypical for him.  Improved rapidly with fluid administration.  Lab work reviewed.  Mildly elevated glucose of 367 and CO2 of 18, however this is in line with previous levels, doubt DKA  Patient's blood pressure improved rapidly after IV fluids, he does have a history of chronic hypertension for which he is opposed to taking midodrine, he has difficulties with compliance  I recommended repeat blood work after fluids however he is not interested in further treatment at this time, appropriate for discharge with close outpatient follow-up.    ____________________________________________   FINAL CLINICAL IMPRESSION(S) / ED DIAGNOSES  Final diagnoses:  Fall, initial encounter  Hypotension, unspecified hypotension type        Note:  This document was prepared using Dragon voice recognition software and may include unintentional dictation errors.   [name], MD 09/12/20 1452

## 2020-09-12 NOTE — ED Triage Notes (Signed)
Patient was discharged during the night and has been in the lobby since then.  He has been trying to call rides without success.  He was finally asked to leave the lobby.  He went  Outside and bystanders said he fell.

## 2020-09-12 NOTE — ED Notes (Signed)
Patient appears angry that we are not providing a taxi ride for him.  Charge Nurse explained that we would be happy to call a taxi for him then asked if he had money.  He replied, "No" Advised the taxi won't come if he can't pay and we would be happy to call his mother who brought him here.  He replied, "She's asleep" Given medications after advising patient it would help with his pain.  Given discharge paperwork and he replied, "I don't want this shit" and threw paperwork in the hallway. Patient rolled via wheelchair to the phone .and wished well.

## 2020-09-15 ENCOUNTER — Ambulatory Visit: Payer: Self-pay | Admitting: Physician Assistant

## 2020-09-16 ENCOUNTER — Encounter: Payer: Self-pay | Admitting: Physician Assistant

## 2020-09-26 LAB — BLOOD GAS, VENOUS
Acid-base deficit: 6.9 mmol/L — ABNORMAL HIGH (ref 0.0–2.0)
Bicarbonate: 19.7 mmol/L — ABNORMAL LOW (ref 20.0–28.0)
O2 Saturation: 14.3 %
Patient temperature: 37
pCO2, Ven: 43 mmHg — ABNORMAL LOW (ref 44.0–60.0)
pH, Ven: 7.27 (ref 7.250–7.430)

## 2020-10-11 DEATH — deceased
